# Patient Record
Sex: Female | Born: 1950
Health system: Southern US, Community
[De-identification: ages and names within clinical notes are randomized; demographics above are authoritative.]

## PROBLEM LIST (undated history)

## (undated) DIAGNOSIS — I493 Ventricular premature depolarization: Secondary | ICD-10-CM

## (undated) DIAGNOSIS — N186 End stage renal disease: Secondary | ICD-10-CM

## (undated) DIAGNOSIS — I428 Other cardiomyopathies: Secondary | ICD-10-CM

## (undated) DIAGNOSIS — R011 Cardiac murmur, unspecified: Secondary | ICD-10-CM

## (undated) DIAGNOSIS — I10 Essential (primary) hypertension: Secondary | ICD-10-CM

## (undated) DIAGNOSIS — I340 Nonrheumatic mitral (valve) insufficiency: Secondary | ICD-10-CM

## (undated) DIAGNOSIS — N189 Chronic kidney disease, unspecified: Secondary | ICD-10-CM

## (undated) DIAGNOSIS — I272 Pulmonary hypertension, unspecified: Secondary | ICD-10-CM

## (undated) DIAGNOSIS — I509 Heart failure, unspecified: Secondary | ICD-10-CM

## (undated) DIAGNOSIS — E042 Nontoxic multinodular goiter: Secondary | ICD-10-CM

## (undated) DIAGNOSIS — R0600 Dyspnea, unspecified: Secondary | ICD-10-CM

## (undated) DIAGNOSIS — I499 Cardiac arrhythmia, unspecified: Secondary | ICD-10-CM

## (undated) DIAGNOSIS — T884XXA Failed or difficult intubation, initial encounter: Secondary | ICD-10-CM

## (undated) HISTORY — DX: Essential (primary) hypertension: I10

## (undated) HISTORY — DX: Ventricular premature depolarization: I49.3

## (undated) HISTORY — DX: Other cardiomyopathies: I42.8

## (undated) HISTORY — DX: Pulmonary hypertension, unspecified: I27.20

## (undated) HISTORY — DX: Nonrheumatic mitral (valve) insufficiency: I34.0

## (undated) HISTORY — DX: Cardiac arrhythmia, unspecified: I49.9

---

## 1998-05-27 ENCOUNTER — Encounter: Admission: RE | Admit: 1998-05-27 | Discharge: 1998-05-27 | Payer: Self-pay | Admitting: Family Medicine

## 1998-05-30 ENCOUNTER — Encounter: Admission: RE | Admit: 1998-05-30 | Discharge: 1998-05-30 | Payer: Self-pay | Admitting: Family Medicine

## 1998-10-03 ENCOUNTER — Other Ambulatory Visit: Admission: RE | Admit: 1998-10-03 | Discharge: 1998-10-03 | Payer: Self-pay | Admitting: Obstetrics and Gynecology

## 1999-11-06 ENCOUNTER — Other Ambulatory Visit: Admission: RE | Admit: 1999-11-06 | Discharge: 1999-11-06 | Payer: Self-pay | Admitting: Obstetrics and Gynecology

## 1999-11-20 ENCOUNTER — Encounter: Payer: Self-pay | Admitting: Obstetrics and Gynecology

## 1999-11-20 ENCOUNTER — Encounter: Admission: RE | Admit: 1999-11-20 | Discharge: 1999-11-20 | Payer: Self-pay | Admitting: Obstetrics and Gynecology

## 1999-11-27 ENCOUNTER — Encounter: Admission: RE | Admit: 1999-11-27 | Discharge: 1999-11-27 | Payer: Self-pay | Admitting: Family Medicine

## 2000-11-08 ENCOUNTER — Other Ambulatory Visit: Admission: RE | Admit: 2000-11-08 | Discharge: 2000-11-08 | Payer: Self-pay | Admitting: Obstetrics and Gynecology

## 2000-11-25 ENCOUNTER — Encounter: Admission: RE | Admit: 2000-11-25 | Discharge: 2000-11-25 | Payer: Self-pay | Admitting: Obstetrics and Gynecology

## 2000-11-25 ENCOUNTER — Encounter: Payer: Self-pay | Admitting: Obstetrics and Gynecology

## 2001-11-10 ENCOUNTER — Other Ambulatory Visit: Admission: RE | Admit: 2001-11-10 | Discharge: 2001-11-10 | Payer: Self-pay | Admitting: Obstetrics and Gynecology

## 2001-11-30 HISTORY — PX: CARDIAC CATHETERIZATION: SHX172

## 2001-12-02 ENCOUNTER — Encounter: Payer: Self-pay | Admitting: Obstetrics and Gynecology

## 2001-12-02 ENCOUNTER — Encounter: Admission: RE | Admit: 2001-12-02 | Discharge: 2001-12-02 | Payer: Self-pay | Admitting: Obstetrics and Gynecology

## 2001-12-16 ENCOUNTER — Ambulatory Visit (HOSPITAL_COMMUNITY): Admission: RE | Admit: 2001-12-16 | Discharge: 2001-12-16 | Payer: Self-pay | Admitting: Cardiology

## 2002-11-14 ENCOUNTER — Other Ambulatory Visit: Admission: RE | Admit: 2002-11-14 | Discharge: 2002-11-14 | Payer: Self-pay | Admitting: Obstetrics and Gynecology

## 2002-12-12 ENCOUNTER — Encounter: Admission: RE | Admit: 2002-12-12 | Discharge: 2002-12-12 | Payer: Self-pay | Admitting: Obstetrics and Gynecology

## 2002-12-12 ENCOUNTER — Encounter: Payer: Self-pay | Admitting: Obstetrics and Gynecology

## 2003-11-16 ENCOUNTER — Other Ambulatory Visit: Admission: RE | Admit: 2003-11-16 | Discharge: 2003-11-16 | Payer: Self-pay | Admitting: Obstetrics and Gynecology

## 2003-12-14 ENCOUNTER — Encounter: Admission: RE | Admit: 2003-12-14 | Discharge: 2003-12-14 | Payer: Self-pay | Admitting: Obstetrics and Gynecology

## 2004-11-17 ENCOUNTER — Other Ambulatory Visit: Admission: RE | Admit: 2004-11-17 | Discharge: 2004-11-17 | Payer: Self-pay | Admitting: Obstetrics and Gynecology

## 2004-12-15 ENCOUNTER — Encounter: Admission: RE | Admit: 2004-12-15 | Discharge: 2004-12-15 | Payer: Self-pay | Admitting: Obstetrics and Gynecology

## 2004-12-16 ENCOUNTER — Ambulatory Visit: Payer: Self-pay | Admitting: Cardiology

## 2005-11-19 ENCOUNTER — Ambulatory Visit: Payer: Self-pay | Admitting: Internal Medicine

## 2005-11-27 ENCOUNTER — Other Ambulatory Visit: Admission: RE | Admit: 2005-11-27 | Discharge: 2005-11-27 | Payer: Self-pay | Admitting: Obstetrics and Gynecology

## 2005-12-16 ENCOUNTER — Encounter: Admission: RE | Admit: 2005-12-16 | Discharge: 2005-12-16 | Payer: Self-pay | Admitting: Obstetrics and Gynecology

## 2005-12-18 ENCOUNTER — Ambulatory Visit: Payer: Self-pay | Admitting: Cardiology

## 2006-11-29 ENCOUNTER — Other Ambulatory Visit: Admission: RE | Admit: 2006-11-29 | Discharge: 2006-11-29 | Payer: Self-pay | Admitting: Obstetrics and Gynecology

## 2006-12-17 ENCOUNTER — Encounter: Admission: RE | Admit: 2006-12-17 | Discharge: 2006-12-17 | Payer: Self-pay | Admitting: Obstetrics and Gynecology

## 2006-12-29 ENCOUNTER — Encounter: Admission: RE | Admit: 2006-12-29 | Discharge: 2006-12-29 | Payer: Self-pay | Admitting: Obstetrics and Gynecology

## 2007-01-18 ENCOUNTER — Ambulatory Visit: Payer: Self-pay | Admitting: Internal Medicine

## 2007-01-18 LAB — CONVERTED CEMR LAB
ALT: 17 units/L (ref 0–40)
AST: 15 units/L (ref 0–37)
Alkaline Phosphatase: 55 units/L (ref 39–117)
BUN: 20 mg/dL (ref 6–23)
CRP, High Sensitivity: 11 — ABNORMAL HIGH (ref 0.00–5.00)
Calcium: 10 mg/dL (ref 8.4–10.5)
GFR calc Af Amer: 74 mL/min
GFR calc non Af Amer: 61 mL/min
Glucose, Bld: 110 mg/dL — ABNORMAL HIGH (ref 70–99)
Ketones, ur: NEGATIVE mg/dL
LDL Cholesterol: 115 mg/dL — ABNORMAL HIGH (ref 0–99)
Nitrite: NEGATIVE
Pro B Natriuretic peptide (BNP): 354 pg/mL — ABNORMAL HIGH (ref 0.0–100.0)
Specific Gravity, Urine: 1.02 (ref 1.000–1.03)
Total CHOL/HDL Ratio: 3.6
Total Protein, Urine: NEGATIVE mg/dL
Total Protein: 7.9 g/dL (ref 6.0–8.3)
Triglycerides: 128 mg/dL (ref 0–149)
Urine Glucose: NEGATIVE mg/dL
VLDL: 26 mg/dL (ref 0–40)

## 2007-01-25 ENCOUNTER — Ambulatory Visit: Payer: Self-pay | Admitting: Pulmonary Disease

## 2007-02-28 ENCOUNTER — Ambulatory Visit: Payer: Self-pay | Admitting: Internal Medicine

## 2007-02-28 LAB — CONVERTED CEMR LAB: TSH: 0.47 microintl units/mL (ref 0.35–5.50)

## 2007-12-05 ENCOUNTER — Other Ambulatory Visit: Admission: RE | Admit: 2007-12-05 | Discharge: 2007-12-05 | Payer: Self-pay | Admitting: Obstetrics and Gynecology

## 2007-12-21 ENCOUNTER — Encounter: Admission: RE | Admit: 2007-12-21 | Discharge: 2007-12-21 | Payer: Self-pay | Admitting: Obstetrics and Gynecology

## 2008-01-20 DIAGNOSIS — I5022 Chronic systolic (congestive) heart failure: Secondary | ICD-10-CM | POA: Insufficient documentation

## 2008-01-20 DIAGNOSIS — I10 Essential (primary) hypertension: Secondary | ICD-10-CM | POA: Insufficient documentation

## 2008-01-20 DIAGNOSIS — E669 Obesity, unspecified: Secondary | ICD-10-CM | POA: Insufficient documentation

## 2008-01-20 DIAGNOSIS — I5023 Acute on chronic systolic (congestive) heart failure: Secondary | ICD-10-CM | POA: Insufficient documentation

## 2008-01-20 DIAGNOSIS — I5033 Acute on chronic diastolic (congestive) heart failure: Secondary | ICD-10-CM | POA: Insufficient documentation

## 2008-01-23 ENCOUNTER — Ambulatory Visit: Payer: Self-pay | Admitting: Internal Medicine

## 2008-01-25 LAB — CONVERTED CEMR LAB
Alkaline Phosphatase: 56 units/L (ref 39–117)
BUN: 15 mg/dL (ref 6–23)
Basophils Relative: 1 % (ref 0.0–1.0)
CO2: 29 meq/L (ref 19–32)
Calcium: 10.2 mg/dL (ref 8.4–10.5)
Chloride: 102 meq/L (ref 96–112)
Creatinine, Ser: 0.9 mg/dL (ref 0.4–1.2)
MCHC: 33.4 g/dL (ref 30.0–36.0)
Monocytes Relative: 8.6 % (ref 3.0–11.0)
Pro B Natriuretic peptide (BNP): 238 pg/mL — ABNORMAL HIGH (ref 0.0–100.0)
RBC: 4.33 M/uL (ref 3.87–5.11)
RDW: 12.6 % (ref 11.5–14.6)
Sodium: 139 meq/L (ref 135–145)
TSH: 0.54 microintl units/mL (ref 0.35–5.50)
Total Bilirubin: 2.3 mg/dL — ABNORMAL HIGH (ref 0.3–1.2)
Total CHOL/HDL Ratio: 3.3
Total Protein: 7.8 g/dL (ref 6.0–8.3)
Triglycerides: 83 mg/dL (ref 0–149)
WBC: 5.6 10*3/uL (ref 4.5–10.5)

## 2008-09-18 ENCOUNTER — Ambulatory Visit: Payer: Self-pay | Admitting: Family Medicine

## 2008-09-20 ENCOUNTER — Ambulatory Visit: Payer: Self-pay | Admitting: *Deleted

## 2008-11-13 ENCOUNTER — Ambulatory Visit: Payer: Self-pay | Admitting: Internal Medicine

## 2008-11-13 ENCOUNTER — Encounter (INDEPENDENT_AMBULATORY_CARE_PROVIDER_SITE_OTHER): Payer: Self-pay | Admitting: Family Medicine

## 2008-11-13 LAB — CONVERTED CEMR LAB
ALT: 9 units/L (ref 0–35)
AST: 9 units/L (ref 0–37)
Albumin: 4.2 g/dL (ref 3.5–5.2)
Alkaline Phosphatase: 63 units/L (ref 39–117)
BUN: 20 mg/dL (ref 6–23)
Basophils Relative: 1 % (ref 0–1)
Calcium: 9.3 mg/dL (ref 8.4–10.5)
Cholesterol: 193 mg/dL (ref 0–200)
Eosinophils Relative: 1 % (ref 0–5)
HCT: 39 % (ref 36.0–46.0)
HDL: 55 mg/dL (ref >39)
Lymphs Abs: 1.9 10*3/uL (ref 0.7–4.0)
Monocytes Absolute: 0.5 10*3/uL (ref 0.1–1.0)
Neutro Abs: 3.1 10*3/uL (ref 1.7–7.7)
Potassium: 4.5 meq/L (ref 3.5–5.3)
Sodium: 139 meq/L (ref 135–145)
Total Bilirubin: 2 mg/dL — ABNORMAL HIGH (ref 0.3–1.2)
WBC: 5.6 10*3/uL (ref 4.0–10.5)

## 2008-11-19 ENCOUNTER — Ambulatory Visit: Payer: Self-pay | Admitting: Family Medicine

## 2008-11-19 LAB — CONVERTED CEMR LAB: Vit D, 1,25-Dihydroxy: 24 — ABNORMAL LOW (ref 30–89)

## 2008-12-04 ENCOUNTER — Ambulatory Visit: Payer: Self-pay | Admitting: Internal Medicine

## 2008-12-21 ENCOUNTER — Ambulatory Visit (HOSPITAL_COMMUNITY): Admission: RE | Admit: 2008-12-21 | Discharge: 2008-12-21 | Payer: Self-pay | Admitting: Internal Medicine

## 2009-03-20 ENCOUNTER — Ambulatory Visit: Payer: Self-pay | Admitting: Family Medicine

## 2009-04-02 ENCOUNTER — Ambulatory Visit: Payer: Self-pay | Admitting: Family Medicine

## 2009-07-03 ENCOUNTER — Ambulatory Visit: Payer: Self-pay | Admitting: Family Medicine

## 2009-08-08 ENCOUNTER — Ambulatory Visit: Payer: Self-pay | Admitting: Family Medicine

## 2009-08-15 ENCOUNTER — Ambulatory Visit: Payer: Self-pay | Admitting: Family Medicine

## 2009-12-09 ENCOUNTER — Other Ambulatory Visit: Admission: RE | Admit: 2009-12-09 | Discharge: 2009-12-09 | Payer: Self-pay | Admitting: Family Medicine

## 2010-01-01 ENCOUNTER — Encounter: Admission: RE | Admit: 2010-01-01 | Discharge: 2010-01-01 | Payer: Self-pay | Admitting: Family Medicine

## 2010-01-23 ENCOUNTER — Encounter: Payer: Self-pay | Admitting: Internal Medicine

## 2010-01-31 ENCOUNTER — Encounter (INDEPENDENT_AMBULATORY_CARE_PROVIDER_SITE_OTHER): Payer: Self-pay | Admitting: *Deleted

## 2010-12-24 ENCOUNTER — Other Ambulatory Visit: Payer: Self-pay | Admitting: Family Medicine

## 2010-12-24 DIAGNOSIS — Z1239 Encounter for other screening for malignant neoplasm of breast: Secondary | ICD-10-CM

## 2010-12-24 DIAGNOSIS — Z1231 Encounter for screening mammogram for malignant neoplasm of breast: Secondary | ICD-10-CM

## 2011-01-01 NOTE — Letter (Signed)
Summary: Referral - not able to see patient  [facility]  [place], [place] [address]   Phone: [phone]  Fax: [phone]       January 31, 2010   [place] [facility] [place]. [address], [place], [place] [address]     Re:   Vanessa [name] DOB:  December 25, 1950 MRN:   [id number]    Dear Dr. Laurann Montana:  Thank you for your kind referral of the above patient.  We have attempted to schedule the recommended procedure Screening Colonoscopy but have not been able to schedule because:  ___ The patient was not available by phone and/or has not returned our calls.   X  The patient declined to schedule the procedure at this time.  We appreciate the referral and hope that we will have the opportunity to treat this patient in the future.    Sincerely,    [facility] [phone]

## 2011-01-01 NOTE — Letter (Signed)
Summary: Letter Concerning [facility]  Letter Concerning [facility]   Imported By: Rise Patience 02/06/2010 12:10:59  _____________________________________________________________________  External Attachment:    Type:   Image     Comment:   External Document

## 2011-01-08 ENCOUNTER — Ambulatory Visit
Admission: RE | Admit: 2011-01-08 | Discharge: 2011-01-08 | Disposition: A | Discharge status: Home / Self Care | Payer: BC Managed Care – PPO | Source: Ambulatory Visit | Attending: Family Medicine | Admitting: Family Medicine

## 2011-01-08 DIAGNOSIS — Z1231 Encounter for screening mammogram for malignant neoplasm of breast: Secondary | ICD-10-CM

## 2011-04-17 NOTE — Assessment & Plan Note (Signed)
[name]                             PULMONARY OFFICE NOTE   Vanessa, [name]                     MRN:          [id number]  DATE:01/18/2007                            DOB:          09/21/1951    HISTORY OF PRESENT ILLNESS:  A 60 year old black female with morbid  obesity complicated by hypertension in turn complicated by nonischemic  cardiopathy with evidence of significant mitral regurgitation and  reduction of ejection fraction in 2002 with only mild mitral  regurgitation by repeat in 2003 with followup yearly now by cardiology.  She admits that she has not been consistent about diet or exercise and  struggling to control her weight. She has not yet seen a nutritionist.  However, she denies any increase in dyspnea over baseline, orthopnea,  PND, exertional chest pain, fevers, chills, sweats or significant cough.   PAST MEDICAL HISTORY:  1. Morbid obesity with target weight less than 146.  2. Hypertension.  3. CHF. Most recent echocardiogram November 2003.  4. Diverticulosis by colonoscopy 2004; in the computer for recall in      2009.  5. Asymptomatic height loss followed by Dr. Cherylann [name] already with      bone density with what she reports is normal.   ALLERGIES:  None known.   MEDICATIONS:  Were recorded in detail on the worksheet dated January 08, 2007, but I am not convinced that this is correct as listed.   SOCIAL HISTORY:  She has never smoked. She states she works on her feet  all day, and thinks she may be losing her job in May. She denies any  alcohol use.   FAMILY HISTORY:  Positive for heart disease in her father who is 30.  Mother has hyperlipidemia. One sister with asthma. All other siblings  are healthy except for one who has developed breast cancer in the last  year.   REVIEW OF SYSTEMS:  Taken in detail on the work sheet and corrected as  listed.   PHYSICAL EXAMINATION:  GENERAL:  This is an obese black female  with  somewhat of a childlike affect and attitude.  VITAL SIGNS:  Stable.  HEENT:  Unremarkable. Oropharynx is clear. Dentition is intact. Ear  canals clear bilaterally. Nasal turbinates were normal.  NECK:  Supple without cervical adenopathy or tenderness. Carotid  upstrokes were brisk without any bruits.  LUNGS:  Lung fields were completely clear bilaterally to auscultation  and percussion.  HEART:  Regular rhythm without murmur, gallop or rub.  ABDOMEN:  Soft, benign.  EXTREMITIES:  Warm without any calf tenderness, cyanosis, clubbing.  Pedal pulses were stronger at the dorsalis pedis and posterior tibial  distribution bilaterally.   EKG revealed sinus rhythm with frequent PVCs.   LABORATORY DATA:  A stable total bilirubin 2. Fasting blood sugar 110.  Total cholesterol 194 with LDL 115 and HDL 53. BNP 354. CRP 11. I  intended to do a TSH, but it was omitted.   Chest x-ray revealed mild cardiomegaly, no infiltrates or effusion.   IMPRESSION:  1. Morbid obesity continues to be this patient's primary  challenge and      will need to be addressed long-time by the patient in a more      consistent fashion. I have offered the services of [facility]      for this purpose and arranged for her to see them with a food diary      in hand. TSH needs to be done at next blood draw.  2. Hypertension is adequately controlled on present regimen. I am      concerned that she is having a bit of trouble keeping up with      instructions, and I am going to have her return for full medication      explanation within two weeks and review her lab work with her.  3. Mild chronic elevation of bilirubin of unclear etiology. No      evidence of biliary obstruction or adverse drug effect at present.      Chart review indicates her bilirubin is 1.9 dating all the way back      to 2005 so she simply may be outlyer on the bell-shaped curve of      normalcy.  4. General health maintenance. Dr. Cherylann [name]  is following her for      gynecology. She is due for colonoscopy in 2009 here. She was due      tetanus today which was given. Followup here will be every three      months with medication reconciliation in the meantime.     Vanessa [name]. Vanessa Novas, MD, Vanessa [name]  Electronically Signed    MBW/MedQ  DD: 01/18/2007  DT: 01/19/2007  Job #: [id number]

## 2011-04-17 NOTE — Assessment & Plan Note (Signed)
[place]                             PULMONARY OFFICE NOTE   Vanessa [name], Vanessa [name]                     MRN:          [id number]  DATE:01/25/2007                            DOB:          1950/12/21    HISTORY OF PRESENT ILLNESS:  The patient is a 60 year old African-  American female patient of Dr. Gustavus Bryant who has a known history of morbid  obesity complicated by hypertension and nonischemic cardiomyopathy who  presents for a 1-week followup to review medications.  The patient has  brought all of her medications in today.  The patient has had many  changes to her medication regimen recently and do not seem to be correct  with our medication list.  The patient is supposed to be taking Lasix 40  mg daily; however, reports she is not taking that, very rarely takes at  all.  The patient also was thought to be on bisoprolol HCT but is in  fact on just bisoprolol 5 mg and a separate hydrochlorothiazide  prescription.  Also, the patient previously had been thought to be  taking Diovan and digoxin but however is not taking that.  The patient  denies any chest pain, shortness of breath, abdominal pain, nausea,  vomiting.  Last visit, the patient had been recommended to go to a  nutritionist to help with her weight.  The patient also had lab work  done which showed a total cholesterol of 194, an LDL of 115, and an HDL  of 53, and a BNP elevated at 354 and CRP at 11.  A TSH was not done last  visit and will need to be done today.   PAST MEDICAL HISTORY:  Reviewed.   CURRENT MEDICATIONS:  Reviewed.   PHYSICAL EXAMINATION:  GENERAL:  The patient is a morbidly-obese female  in no acute distress.  VITAL SIGNS:  She is afebrile with stable vital signs.  O2 saturation is  94% on room air.  HEENT:  Unremarkable.  NECK:  Supple without adenopathy.  No JVD.  LUNG SOUNDS:  Clear.  CARDIAC:  Regular rate and rhythm.  ABDOMEN:  Soft and obese.  EXTREMITIES:  Warm  without any calf tenderness, cyanosis, clubbing, or  edema.   IMPRESSION AND PLAN:  1. Complex medication regimen.  The patient's medications reviewed in      detail.  Patient education was provided.  A computerized medication      calendar was completed for this patient and reviewed in detail.      The patient is aware to bring this back to each and every visit.      Medications refills were given through [facility] mail order today.  2. Morbid obesity.  TSH is pending at time of dictation.  3. Hypertension, well controlled.   The patient will follow back up with Dr. [name] in 3 months or sooner if  needed.      Rexene Edison, NP  Electronically Signed      [name] [name], MD, [facility]  Electronically Signed   TP/MedQ  DD: 01/26/2007  DT: 01/26/2007  Job #: [id number]

## 2011-04-17 NOTE — Assessment & Plan Note (Signed)
[place]                             PULMONARY OFFICE NOTE   Vanessa [name], Vanessa [name]                     MRN:          [id number]  DATE:02/28/2007                            DOB:          October 19, 1951    HISTORY:  This is a 60 year old black female with morbid obesity and  hypertension as well as a history of idiopathic nonischemic  cardiomyopathy.  She returns now after a comprehensive healthcare  evaluation which was done to follow up her chronic problems revealed  that her TSH was excessively suppressed.  Two years ago her TSH was  borderline suppressed.  Despite this finding, the patient denies any  excess heat or cold intolerance, palpitations, nervousness, frequency of  bowel movements, sweating, or tremor.  She also denies any exertional  chest pain, orthopnea, PND, or leg swelling.   PAST MEDICAL HISTORY:  Significant for:  1. Nonischemic cardiomyopathy as noted.  2. Diverticulosis.   MEDICATIONS:  Reviewed in detail and serendipitously she is already on  bisoprolol at 5 mg one daily.   ALLERGIES:  None known.   FAMILY HISTORY:  Negative for thyroid disease.   SOCIAL HISTORY:  She has never been a smoker.   PHYSICAL EXAMINATION:  VITAL SIGNS:  Note that her pulse rate is 82 on  this dose of bisoprolol and on her last visit her pulse rate was 102.  HEENT:  Significant for a subtle lid lag.  Oropharynx is otherwise clear  with tongue midline.  NECK:  Reveals minimal thyromegaly diffusely with no definite bruit.  There is no adenopathy or obvious neck mass.  HEART:  Regular rate and rhythm without murmur, gallop or rub present.  ABDOMEN:  Soft, benign.  EXTREMITIES:  Warm without calf tenderness, cyanosis, clubbing, edema.  There was minimal sweating of the palms.  There was also minimal  hyperreflexia.   IMPRESSION:  1. Incipient hyperthyroidism, possible Graves disease associated with      diffuse thyroid enlargement and subtle lid  lag.  I am going to      confirmed the diagnosis with a total T4, T3 uptake and free T3      level today and repeat her TSH.  If these all point to      hyperthyroidism, I suspect that I will be referring her on to Dr.      Loanne Drilling.  2. Hypertension, well controlled.  As noted bisoprolol is an excellent      choice for her apparent hyperthyroid state and should not be      changed.  3. Congestive heart failure relatively well compensated on her present      regimen.  No changes in therapy.  4. The record indicates she has had height loss.  One reason to treat      her thyroid state is so she does not have significant calcium      balance disturbance.  I will make Dr. Cherylann Banas aware since he has      been following her bone metabolism, that she may be hyperthyroid      and we will attempt  to correct that as soon as possible.  5. I had a long discussion with this patient regarding her other major      issue, namely weight loss, which she has yet to address in a      consistent fashion.  One concern is that treating hyperthyroidism      may actually translate into more weight gain but time will tell in      this regard.  I have offered to refer her to nutrition which at      this point she declines and note that she does plan to begin      exercising regularly but I have told her to take it easy for now      until we know whether or not she is truly hyperthyroid.     [name] [name], MD, [facility]  Electronically Signed    MBW/MedQ  DD: 02/28/2007  DT: 02/28/2007  Job #: [id number]   cc:   Quillian Quince L. Cherylann Banas, M.D.

## 2011-04-17 NOTE — Cardiovascular Report (Signed)
[place]  Patient:    ZAHIRA, FOLKS Visit Number: [id number] MRN: [id number]          Service Type: CAT Location: [place] 01 Attending Physician:  Ernestine Mcmurray Dictated by:   Terald Sleeper, M.D. [facility] Proc. Date: 12/16/01 Admit Date:  12/16/2001   CC:         Legrand Como B. [name], M.D. [facility]   Cardiac Catheterization  DATE OF BIRTH:  19-Apr-1951.  REFERRING PHYSICIAN:  [name] [name], M.D.  OPERATOR:  Terald Sleeper, M.D.  PROCEDURES PERFORMED: 1. Left and right heart catheterization. 2. Selective coronary angiography. 3. Ventriculography.  DIAGNOSES: 1. Nonischemic cardiomyopathy. 2. Severe left ventricular systolic dysfunction. 3. No evidence of flow-limiting epicardial coronary artery disease. 4. Elevated left ventricular end diastolic pressure.  HISTORY:  The patient is a 60 year old African-American female with a longstanding history of hypertension.  Over the last several months, the patient had developed increased shortness of breath and was referred for cardiology evaluation.  The patient was seen by Dr. Dannielle Burn and found to have signs and symptoms of congestive heart failure.  An echocardiographic study was ordered and the patient was found to have moderately severe LV systolic dysfunction and 2+ mitral regurgitation with possible segmental wall motion abnormalities.  The patient subsequently was referred for a diagnostic catheterization to assess her coronary anatomy as well as left- and right-sided hemodynamics.  DESCRIPTION OF PROCEDURE:  After informed consent was obtained, the patient was brought to the catheterization laboratory.  The right groin was sterilely prepped and draped.  Lidocaine, 1%, was infiltrated.  A #6 French arterial sheath was placed according to modified Seldinger technique.  Subsequently, a #7 [place] venous sheath was placed also according to the modified Seldinger technique.  A right heart  catheter was floated and appropriate right-sided hemodynamics were obtained as well as thermodilution and Fick cardiac output calculations.  Subsequently coronary angiography was performed using #6 [place] preformed JL4 and JR4 catheters.  Selective coronary angiography was performed in various projections using manual injection of contrast.  Subsequently, left ventriculography was performed using a #6 French angled pigtail catheter. Appropriate left-sided hemodynamics were obtained.  Ventriculography was performed in single plane RAO projection using power injection of contrast. At the termination of the procedure, all catheters and sheath were removed and the patient was brought back to the holding area.  FINDINGS:  Hemodynamics:  Mean right atrial pressure 12 mmHg.  Right ventricular pressure 51/15 mmHg.  PA pressure 51/27 mmHg.  Pulmonary capillary wedge pressure 20 mmHg.  Left ventricular pressure 130/20 mmHg.  Aortic pressure was 128/85 mmHg.  Cardiac output by thermodilution 4.9 liters per minute.  Cardiac index by thermodilution 2.5 liters per minute.  Cardiac index by thermodilution 2.5 liters per minute.  Cardiac output calculated according to the Fick equation yielded a cardiac output of 3.1 liters per minute and a cardiac index of 1.58 liters per minute.  This corresponded to a systemic vascular resistance of 2322 dynes x 1 cm divided by centimeters to the 5th.  Ventriculography:  Ejection fraction estimated at 25-30%.  Ejection fraction was calculated at 27%.  There was 1-2+ mitral regurgitation.  Overall there appeared to be global hypokinesis.  Selective coronary angiography: 1. The left main coronary artery was a large caliber vessel with no evidence    of flow-limiting coronary artery disease. 2. The left anterior descending artery was a large caliber vessel with no    evidence of epicardial flow-limiting  coronary artery disease. 3. The circumflex coronary artery is a  codominant vessel with no evidence of    flow-limiting coronary artery disease. 4. The right coronary artery, again, a large caliber vessel terminating in a    small posterior descending artery with no evidence of flow-limiting    coronary artery disease.  RECOMMENDATIONS:  The results were relayed to Dr. [name].  It appears the pativentricular has a nonischemic cardiomyopathy still with slightly elevated left ent end diastolic pressures but with low output.  The patients medical regimen will be adjusted.  Digoxin will be added at 0.25 mg p.o. q day. Diovan can be increased to 320 mg p.o. q day and the careful addition of a beta blocker with bisoprolol 2.5 mg p.o. q day.  The patient will have a follow-up appointment with Dr. Dannielle Burn.  In the future can be followed in the heart failure clinic.  The patient should also be considered for the Warcef trial and a referral to our research foundation will be made. Dictated by:   Terald Sleeper, M.D. [place] Attending Physician:  Ernestine Mcmurray DD:  12/16/01 TD:  12/16/01 Job: [id number]

## 2011-06-09 ENCOUNTER — Other Ambulatory Visit (HOSPITAL_COMMUNITY)
Admission: RE | Admit: 2011-06-09 | Discharge: 2011-06-09 | Disposition: A | Discharge status: Home / Self Care | Payer: BC Managed Care – PPO | Source: Ambulatory Visit | Attending: Family Medicine | Admitting: Family Medicine

## 2011-06-09 ENCOUNTER — Other Ambulatory Visit: Payer: Self-pay | Admitting: Physician Assistant

## 2011-06-09 DIAGNOSIS — Z124 Encounter for screening for malignant neoplasm of cervix: Secondary | ICD-10-CM | POA: Insufficient documentation

## 2011-12-03 ENCOUNTER — Other Ambulatory Visit: Payer: Self-pay | Admitting: Family Medicine

## 2011-12-03 DIAGNOSIS — Z1231 Encounter for screening mammogram for malignant neoplasm of breast: Secondary | ICD-10-CM

## 2012-01-11 ENCOUNTER — Ambulatory Visit
Admission: RE | Admit: 2012-01-11 | Discharge: 2012-01-11 | Disposition: A | Discharge status: Home / Self Care | Payer: BC Managed Care – PPO | Source: Ambulatory Visit | Attending: Family Medicine | Admitting: Family Medicine

## 2012-01-11 DIAGNOSIS — Z1231 Encounter for screening mammogram for malignant neoplasm of breast: Secondary | ICD-10-CM

## 2012-06-08 ENCOUNTER — Other Ambulatory Visit: Payer: Self-pay | Admitting: Physician Assistant

## 2012-06-08 ENCOUNTER — Other Ambulatory Visit (HOSPITAL_COMMUNITY)
Admission: RE | Admit: 2012-06-08 | Discharge: 2012-06-08 | Disposition: A | Discharge status: Home / Self Care | Payer: BC Managed Care – PPO | Source: Ambulatory Visit | Attending: Family Medicine | Admitting: Family Medicine

## 2012-06-08 DIAGNOSIS — Z124 Encounter for screening for malignant neoplasm of cervix: Secondary | ICD-10-CM | POA: Insufficient documentation

## 2012-09-30 HISTORY — PX: CARDIAC CATHETERIZATION: SHX172

## 2012-10-14 ENCOUNTER — Other Ambulatory Visit: Payer: Self-pay | Admitting: Cardiology

## 2012-10-18 ENCOUNTER — Encounter: Payer: Self-pay | Admitting: Cardiology

## 2012-10-18 ENCOUNTER — Other Ambulatory Visit: Payer: Self-pay | Admitting: Cardiology

## 2012-10-18 NOTE — H&P (Signed)
  Office Visit     [name], Vanessa [name] Provider: Fransico Him, MD  DOB: 1951/01/22 Age: 61 Y Sex: Female Date: 10/03/2012  Phone: [phone]   Address: [address] [place], [place]  Pcp: NOELLE REDMON       Subjective:     CC:    1. TT/F/u on stress test results.        HPI:  General:  The patient presents today for followup of her recent nuclear stress test. She recently had a PE done and a murmur was noted. An echo showed mild LV dysfunction with moderate MR and EF 45%. Shecomplained of occasional pain in her left shoulder blade. The scapular pain comes on with exertion and resolves with rest. A nuclear stress test was done which showed moderate LV dysfunction and ischemia in the apcial and inferoapical regions. .        Medical History: Hypertension, mod MR and mild LV dysfunction EF 45% with moderate MR by echo 2012, history of nonischemic DCM by cath 2003 showing EF 27% with 1-2+ MR, normal coronary arteries, PVC's.        Family History: Father: deceased CHF Sister 1: Breast CA (dx in early 77s)        Social History:  General:  History of smoking  cigarettes: Never smoked no Alcohol.  no Exercise.  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  Marital Status: single.  Children: 1.        Medications: Calcium 600 MG Tablet as directed once a day, Ecotrin 325 MG Tablet Delayed Release 1 tablet once a day, Vitamin D 1000 UNIT Tablet 1 tablet Once a day, Hydrochlorothiazide 25 MG Tablet 1 tablet Once a day, Bisoprolol Fumarate 5 MG Tablet 1 tablet Once a day, Amlodipine Besylate 10 MG Tablet 1 tablet Once a day, Medication List reviewed and reconciled with the patient       Allergies: N.K.D.A.      Objective:     Vitals: Wt 209.8, Wt change -.2 lb, Ht 62, BMI 38.37, Pulse sitting 80, BP sitting 142/80.       Examination:        Assessment:     Assessment:  1. Abnormal cardiovascular function study - 794.30 (Primary)  2. Benign  hypertension - 401.1  3. Cardiomyopathy - 425.4  4. Mitral Regurgitation - 424.0  5. PVCs (premature ventricular contractions) - 427.69    Plan:     1. Abnormal cardiovascular function study  I have discussed the results of the nuclear stress test that was done for exercise induced scapular pain. Her nuclear stress test shows an apical and inferoapical defect with stress and worsening LVF at 33%. I have recommended proceeding with cardiac cath. , Risks and benefits of cardiac catheterization have been reviewed including risk of stroke, heart attack, death, bleeding, renal impariment and arterial damage. There was ample oppurtuny to answer questions. Alternatives were discussed. Patient understands and wishes to proceed.        Immunizations:        Labs:        Preventive:         Follow Up: cath      Provider: Fransico Him, MD  [name], Vanessa [name] DOB: Feb 08, 1951 Date: 10/03/2012

## 2012-10-21 ENCOUNTER — Encounter (HOSPITAL_BASED_OUTPATIENT_CLINIC_OR_DEPARTMENT_OTHER)
Admission: RE | Disposition: A | Discharge status: Home / Self Care | Payer: Self-pay | Source: Ambulatory Visit | Attending: Cardiology

## 2012-10-21 ENCOUNTER — Inpatient Hospital Stay (HOSPITAL_BASED_OUTPATIENT_CLINIC_OR_DEPARTMENT_OTHER)
Admission: RE | Admit: 2012-10-21 | Discharge: 2012-10-21 | Disposition: A | Discharge status: Home / Self Care | Payer: BC Managed Care – PPO | Source: Ambulatory Visit | Attending: Cardiology | Admitting: Cardiology

## 2012-10-21 DIAGNOSIS — I428 Other cardiomyopathies: Secondary | ICD-10-CM | POA: Insufficient documentation

## 2012-10-21 DIAGNOSIS — I059 Rheumatic mitral valve disease, unspecified: Secondary | ICD-10-CM | POA: Insufficient documentation

## 2012-10-21 DIAGNOSIS — I420 Dilated cardiomyopathy: Secondary | ICD-10-CM | POA: Diagnosis present

## 2012-10-21 DIAGNOSIS — I10 Essential (primary) hypertension: Secondary | ICD-10-CM

## 2012-10-21 DIAGNOSIS — R079 Chest pain, unspecified: Secondary | ICD-10-CM | POA: Insufficient documentation

## 2012-10-21 DIAGNOSIS — I509 Heart failure, unspecified: Secondary | ICD-10-CM

## 2012-10-21 SURGERY — JV LEFT HEART CATHETERIZATION WITH CORONARY ANGIOGRAM
Anesthesia: Moderate Sedation

## 2012-10-21 MED ORDER — SODIUM CHLORIDE 0.9 % IV SOLN: 250.0000 mL | INTRAVENOUS | Status: DC | PRN

## 2012-10-21 MED ORDER — SODIUM CHLORIDE 0.9 % IV SOLN: 1.0000 mL/kg/h | INTRAVENOUS | Status: DC

## 2012-10-21 MED ORDER — ONDANSETRON HCL 4 MG/2ML IJ SOLN: 4.0000 mg | Freq: Four times a day (QID) | INTRAMUSCULAR | Status: DC | PRN

## 2012-10-21 MED ORDER — ACETAMINOPHEN 325 MG PO TABS: 650.0000 mg | ORAL_TABLET | ORAL | Status: DC | PRN

## 2012-10-21 MED ORDER — FUROSEMIDE 20 MG PO TABS: 20.0000 mg | ORAL_TABLET | Freq: Every day | ORAL | Status: DC

## 2012-10-21 MED ORDER — RAMIPRIL 5 MG PO CAPS: 5.0000 mg | ORAL_CAPSULE | Freq: Every day | ORAL | Status: DC

## 2012-10-21 MED ORDER — ASPIRIN 81 MG PO CHEW
324.0000 mg | CHEWABLE_TABLET | ORAL | Status: AC
  Administered 2012-10-21: 324 mg via ORAL

## 2012-10-21 MED ORDER — ASPIRIN 81 MG PO TBEC: 81.0000 mg | DELAYED_RELEASE_TABLET | Freq: Every day | ORAL | Status: DC

## 2012-10-21 MED ORDER — AMLODIPINE BESYLATE 5 MG PO TABS: 5.0000 mg | ORAL_TABLET | Freq: Every day | ORAL | Status: DC

## 2012-10-21 MED ORDER — CARVEDILOL 6.25 MG PO TABS: 6.2500 mg | ORAL_TABLET | Freq: Two times a day (BID) | ORAL | Status: DC

## 2012-10-21 MED ORDER — SODIUM CHLORIDE 0.9 % IV SOLN: INTRAVENOUS | Status: DC

## 2012-10-21 MED ORDER — SODIUM CHLORIDE 0.9 % IJ SOLN: 3.0000 mL | Freq: Two times a day (BID) | INTRAMUSCULAR | Status: DC

## 2012-10-21 MED ORDER — SODIUM CHLORIDE 0.9 % IJ SOLN: 3.0000 mL | INTRAMUSCULAR | Status: DC | PRN

## 2012-10-21 MED ORDER — DIAZEPAM 5 MG PO TABS
5.0000 mg | ORAL_TABLET | ORAL | Status: AC
  Administered 2012-10-21: 5 mg via ORAL

## 2012-10-21 NOTE — H&P (View-Only) (Signed)
  Office Visit     [name], Vanessa [name] Provider: Fransico Him, MD  DOB: July 09, 1951 Age: 61 Y Sex: Female Date: 10/03/2012  Phone: [phone]   Address: [address] [place], [place]  Pcp: NOELLE REDMON       Subjective:     CC:    1. TT/F/u on stress test results.        HPI:  General:  The patient presents today for followup of her recent nuclear stress test. She recently had a PE done and a murmur was noted. An echo showed mild LV dysfunction with moderate MR and EF 45%. Shecomplained of occasional pain in her left shoulder blade. The scapular pain comes on with exertion and resolves with rest. A nuclear stress test was done which showed moderate LV dysfunction and ischemia in the apcial and inferoapical regions. .        Medical History: Hypertension, mod MR and mild LV dysfunction EF 45% with moderate MR by echo 2012, history of nonischemic DCM by cath 2003 showing EF 27% with 1-2+ MR, normal coronary arteries, PVC's.        Family History: Father: deceased CHF Sister 1: Breast CA (dx in early 73s)        Social History:  General:  History of smoking  cigarettes: Never smoked no Alcohol.  no Exercise.  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  Marital Status: single.  Children: 1.        Medications: Calcium 600 MG Tablet as directed once a day, Ecotrin 325 MG Tablet Delayed Release 1 tablet once a day, Vitamin D 1000 UNIT Tablet 1 tablet Once a day, Hydrochlorothiazide 25 MG Tablet 1 tablet Once a day, Bisoprolol Fumarate 5 MG Tablet 1 tablet Once a day, Amlodipine Besylate 10 MG Tablet 1 tablet Once a day, Medication List reviewed and reconciled with the patient       Allergies: N.K.D.A.      Objective:     Vitals: Wt 209.8, Wt change -.2 lb, Ht 62, BMI 38.37, Pulse sitting 80, BP sitting 142/80.       Examination:        Assessment:     Assessment:  1. Abnormal cardiovascular function study - 794.30 (Primary)  2. Benign  hypertension - 401.1  3. Cardiomyopathy - 425.4  4. Mitral Regurgitation - 424.0  5. PVCs (premature ventricular contractions) - 427.69    Plan:     1. Abnormal cardiovascular function study  I have discussed the results of the nuclear stress test that was done for exercise induced scapular pain. Her nuclear stress test shows an apical and inferoapical defect with stress and worsening LVF at 33%. I have recommended proceeding with cardiac cath. , Risks and benefits of cardiac catheterization have been reviewed including risk of stroke, heart attack, death, bleeding, renal impariment and arterial damage. There was ample oppurtuny to answer questions. Alternatives were discussed. Patient understands and wishes to proceed.        Immunizations:        Labs:        Preventive:         Follow Up: cath      Provider: Fransico Him, MD  [name], Vanessa [name] DOB: February 20, 1951 Date: 10/03/2012

## 2012-10-21 NOTE — Interval H&P Note (Signed)
History and Physical Interval Note:  10/21/2012 8:49 AM  Vanessa [name]  has presented today for surgery, with the diagnosis of abn stress test  The various methods of treatment have been discussed with the patient and family. After consideration of risks, benefits and other options for treatment, the patient has consented to  Procedure(s) (LRB) with comments: JV LEFT HEART CATHETERIZATION WITH CORONARY ANGIOGRAM (N/A) as a surgical intervention .  The patient's history has been reviewed, patient examined, no change in status, stable for surgery.  I have reviewed the patient's chart and labs.  Questions were answered to the patient's satisfaction.     Audreyana Huntsberry R

## 2012-10-21 NOTE — CV Procedure (Addendum)
PROCEDURE:  Left heart catheterization with selective coronary angiography, left ventriculogram.  INDICATIONS:  Chest pain with ischemia on nuclear stress test  The risks, benefits, and details of the procedure were explained to the patient.  The patient verbalized understanding and wanted to proceed.  Informed written consent was obtained.  PROCEDURE TECHNIQUE:  After Xylocaine anesthesia a 55F sheath was placed in the right femoral artery with a single anterior needle wall stick.   Left coronary angiography was done using a Judkins L4 guide catheter.  Right coronary angiography was done using a Judkins R4 guide catheter.  Left ventriculography was done using a pigtail catheter.    CONTRAST:  Total of 80 cc.  COMPLICATIONS:  None.    HEMODYNAMICS:  Aortic pressure was 126/40mmHg; LV pressure was 124/57mmHg; LVEDP 109mmHg.  There was no gradient between the left ventricle and aorta.    ANGIOGRAPHIC DATA:   The left main coronary artery is widely patent and very short with a dual ostium for the LAD and left circumflex.  The left anterior descending artery is widely patent.  It gives rise to a first diagonal which is small and widely patent It then gives rise to aseveral small  Diagonal branches (D2, D3 and D4) which are small and patent.  The left circumflex artery is widely patent.  It gives rise to a small OM1 which is patent and then a second OM which is moderate in size and patent.  The ongoing left circumflex is widely patent and gives rise to a large OM3 which is widely patent.  It then give rise to an OM4 which is large and widely patent.  The ongoing left circumflex is patent.  The right coronary artery is widely patent and distally bifurcates into a PDA and PL branches which are patent.  LEFT VENTRICULOGRAM:  Left ventricular angiogram was done in the 30 RAO projection and revealed normal left ventricular wall motion and systolic function with an estimated ejection fraction of 35-40%.   LVEDP was 22 mmHg.  IMPRESSIONS:  1. Normal left main coronary artery. 2. Normal left anterior descending artery and its branches. 3. Normal left circumflex artery and its branches. 4. Normal right coronary artery. 5. Normal left ventricular systolic function.  LVEDP 22 mmHg.  Ejection fraction 35-40%. 6.  Increased LVEDP c/w diastolic dysfunction  RECOMMENDATION:   1.  Aggressive medical therapy of nonischemic DCM 2.  D/C bisoprolol 3.  Start Coreg 6.25mg  BID 4.  Start Altace 5mg  daily 5.  Decrease Amlodipine to 5mg  daily. 6.  Stop HCTZ 7.  Start Lasix 20mg  daily 8.  Followup with my NP in 1 week

## 2012-10-21 NOTE — OR Nursing (Signed)
Discharge instructions reviewed and signed, pt stated understanding, ambulated in hall without difficulty, site level 0, transported to friend's car via wheelchair 

## 2012-10-21 NOTE — Progress Notes (Signed)
Bedrest begins @ 0930.  Tegaderm dressing applied to right groin site by Suella Broad, RN, site level 0.

## 2012-11-20 ENCOUNTER — Encounter (HOSPITAL_COMMUNITY): Payer: Self-pay | Admitting: Pharmacist

## 2012-11-28 ENCOUNTER — Other Ambulatory Visit: Payer: Self-pay | Admitting: Obstetrics and Gynecology

## 2012-11-29 ENCOUNTER — Encounter (HOSPITAL_COMMUNITY): Payer: Self-pay

## 2012-11-29 ENCOUNTER — Encounter (HOSPITAL_BASED_OUTPATIENT_CLINIC_OR_DEPARTMENT_OTHER): Payer: Self-pay | Admitting: Anesthesiology

## 2012-11-29 ENCOUNTER — Encounter (HOSPITAL_COMMUNITY)
Admission: RE | Admit: 2012-11-29 | Discharge: 2012-11-29 | Disposition: A | Discharge status: Home / Self Care | Payer: BC Managed Care – PPO | Source: Ambulatory Visit | Attending: Obstetrics and Gynecology | Admitting: Obstetrics and Gynecology

## 2012-11-29 LAB — CBC
Hemoglobin: 12.1 g/dL (ref 12.0–15.0)
MCH: 29.8 pg (ref 26.0–34.0)
MCHC: 32.9 g/dL (ref 30.0–36.0)
MCV: 90.6 fL (ref 78.0–100.0)

## 2012-11-29 LAB — BASIC METABOLIC PANEL
BUN: 16 mg/dL (ref 6–23)
Calcium: 9.3 mg/dL (ref 8.4–10.5)
GFR calc non Af Amer: 76 mL/min — ABNORMAL LOW (ref >90)
Glucose, Bld: 94 mg/dL (ref 70–99)
Potassium: 4.1 mEq/L (ref 3.5–5.1)

## 2012-11-29 NOTE — Pre-Procedure Instructions (Signed)
History and cardiac results reviewed by Drs Royce Macadamia and Seward Speck. Anes. Interview by Dr. Glennon Mac.

## 2012-11-29 NOTE — Patient Instructions (Addendum)
20 Vanessa [name]  11/29/2012   Your procedure is scheduled on:  12/03/11  Enter through the Main Entrance of [facility] [place] up the phone at the desk and dial [phone].   Call this number if you have problems the morning of surgery: [phone]   Remember:   Do not eat food:After Midnight.  Do not drink clear liquids: After Midnight.  Take these medicines the morning of surgery with A SIP OF WATER: Blood pressure medications, do not take vitamins   Do not wear jewelry, make-up or nail polish.  Do not wear lotions, powders, or perfumes. You may wear deodorant.  Do not shave 48 hours prior to surgery.  Do not bring valuables to the hospital.  Contacts, dentures or bridgework may not be worn into surgery.  Leave suitcase in the car. After surgery it may be brought to your room.  For patients admitted to the hospital, checkout time is 11:00 AM the day of discharge.   Patients discharged the day of surgery will not be allowed to drive home.  Name and phone number of your driver: to be decided  Special Instructions: Shower using CHG 2 nights before surgery and the night before surgery.  If you shower the day of surgery use CHG.  Use special wash - you have one bottle of CHG for all showers.  You should use approximately 1/3 of the bottle for each shower.   Please read over the following fact sheets that you were given: Surgical Site Infection Prevention

## 2012-12-02 ENCOUNTER — Ambulatory Visit (HOSPITAL_COMMUNITY): Payer: BC Managed Care – PPO

## 2012-12-02 ENCOUNTER — Encounter (HOSPITAL_COMMUNITY): Payer: Self-pay

## 2012-12-02 ENCOUNTER — Encounter (HOSPITAL_COMMUNITY)
Admission: RE | Disposition: A | Discharge status: Home / Self Care | Payer: Self-pay | Source: Ambulatory Visit | Attending: Obstetrics and Gynecology

## 2012-12-02 ENCOUNTER — Ambulatory Visit (HOSPITAL_COMMUNITY)
Admission: RE | Admit: 2012-12-02 | Discharge: 2012-12-02 | Disposition: A | Discharge status: Home / Self Care | Payer: BC Managed Care – PPO | Source: Ambulatory Visit | Attending: Obstetrics and Gynecology | Admitting: Obstetrics and Gynecology

## 2012-12-02 DIAGNOSIS — Z01812 Encounter for preprocedural laboratory examination: Secondary | ICD-10-CM | POA: Insufficient documentation

## 2012-12-02 DIAGNOSIS — N950 Postmenopausal bleeding: Secondary | ICD-10-CM | POA: Insufficient documentation

## 2012-12-02 DIAGNOSIS — N840 Polyp of corpus uteri: Secondary | ICD-10-CM

## 2012-12-02 DIAGNOSIS — N841 Polyp of cervix uteri: Secondary | ICD-10-CM | POA: Insufficient documentation

## 2012-12-02 DIAGNOSIS — Z01818 Encounter for other preprocedural examination: Secondary | ICD-10-CM | POA: Insufficient documentation

## 2012-12-02 HISTORY — PX: HYSTEROSCOPY WITH D & C: SHX1775

## 2012-12-02 SURGERY — DILATATION AND CURETTAGE /HYSTEROSCOPY
Anesthesia: Spinal | Site: Uterus | Wound class: Clean Contaminated

## 2012-12-02 MED ORDER — PHENYLEPHRINE 40 MCG/ML (10ML) SYRINGE FOR IV PUSH (FOR BLOOD PRESSURE SUPPORT)
PREFILLED_SYRINGE | INTRAVENOUS | Status: AC
  Filled 2012-12-02: qty 5

## 2012-12-02 MED ORDER — SILVER NITRATE-POT NITRATE 75-25 % EX MISC
CUTANEOUS | Status: DC | PRN
  Administered 2012-12-02: 1

## 2012-12-02 MED ORDER — BUPIVACAINE IN DEXTROSE 0.75-8.25 % IT SOLN
INTRATHECAL | Status: DC | PRN
  Administered 2012-12-02: 1.2 mL via INTRATHECAL

## 2012-12-02 MED ORDER — MIDAZOLAM HCL 2 MG/2ML IJ SOLN
INTRAMUSCULAR | Status: AC
  Filled 2012-12-02: qty 2

## 2012-12-02 MED ORDER — EPHEDRINE 5 MG/ML INJ
INTRAVENOUS | Status: AC
  Filled 2012-12-02: qty 10

## 2012-12-02 MED ORDER — BUPIVACAINE HCL (PF) 0.25 % IJ SOLN
INTRAMUSCULAR | Status: DC | PRN
  Administered 2012-12-02: 20 mL

## 2012-12-02 MED ORDER — IBUPROFEN 600 MG PO TABS: 600.0000 mg | ORAL_TABLET | Freq: Four times a day (QID) | ORAL | Status: DC | PRN

## 2012-12-02 MED ORDER — GLYCINE 1.5 % IR SOLN
Status: DC | PRN
  Administered 2012-12-02: 3000 mL

## 2012-12-02 MED ORDER — FENTANYL CITRATE 0.05 MG/ML IJ SOLN
INTRAMUSCULAR | Status: DC | PRN
  Administered 2012-12-02: 50 ug via INTRAVENOUS

## 2012-12-02 MED ORDER — PROPOFOL 10 MG/ML IV EMUL
INTRAVENOUS | Status: DC | PRN
  Administered 2012-12-02: 10 mg via INTRAVENOUS
  Administered 2012-12-02: 20 mg via INTRAVENOUS
  Administered 2012-12-02 (×2): 10 mg via INTRAVENOUS

## 2012-12-02 MED ORDER — LIDOCAINE HCL (CARDIAC) 20 MG/ML IV SOLN
INTRAVENOUS | Status: DC | PRN
  Administered 2012-12-02: 30 mg via INTRAVENOUS

## 2012-12-02 MED ORDER — ONDANSETRON HCL 4 MG/2ML IJ SOLN
INTRAMUSCULAR | Status: DC | PRN
  Administered 2012-12-02: 4 mg via INTRAVENOUS

## 2012-12-02 MED ORDER — LACTATED RINGERS IV SOLN
INTRAVENOUS | Status: DC
  Administered 2012-12-02 (×2): via INTRAVENOUS

## 2012-12-02 MED ORDER — PHENYLEPHRINE HCL 10 MG/ML IJ SOLN
INTRAMUSCULAR | Status: DC | PRN
  Administered 2012-12-02 (×2): 40 ug via INTRAVENOUS

## 2012-12-02 MED ORDER — FENTANYL CITRATE 0.05 MG/ML IJ SOLN
INTRAMUSCULAR | Status: AC
  Filled 2012-12-02: qty 2

## 2012-12-02 MED ORDER — ONDANSETRON HCL 4 MG/2ML IJ SOLN
INTRAMUSCULAR | Status: AC
  Filled 2012-12-02: qty 2

## 2012-12-02 MED ORDER — DEXAMETHASONE SODIUM PHOSPHATE 10 MG/ML IJ SOLN
INTRAMUSCULAR | Status: AC
  Filled 2012-12-02: qty 1

## 2012-12-02 MED ORDER — MIDAZOLAM HCL 5 MG/5ML IJ SOLN
INTRAMUSCULAR | Status: DC | PRN
  Administered 2012-12-02: 2 mg via INTRAVENOUS

## 2012-12-02 MED ORDER — EPHEDRINE SULFATE 50 MG/ML IJ SOLN
INTRAMUSCULAR | Status: DC | PRN
  Administered 2012-12-02: 5 mg via INTRAVENOUS

## 2012-12-02 MED ORDER — SILVER NITRATE-POT NITRATE 75-25 % EX MISC
CUTANEOUS | Status: AC
  Filled 2012-12-02: qty 1

## 2012-12-02 MED ORDER — BUPIVACAINE HCL (PF) 0.25 % IJ SOLN
INTRAMUSCULAR | Status: AC
  Filled 2012-12-02: qty 30

## 2012-12-02 MED ORDER — DOXYCYCLINE HYCLATE 100 MG PO TABS: 100.0000 mg | ORAL_TABLET | Freq: Every day | ORAL | Status: DC

## 2012-12-02 MED ORDER — OXYCODONE-ACETAMINOPHEN 5-325 MG PO TABS: ORAL_TABLET | ORAL | Status: DC

## 2012-12-02 MED ORDER — PROPOFOL 10 MG/ML IV EMUL
INTRAVENOUS | Status: AC
  Filled 2012-12-02: qty 20

## 2012-12-02 SURGICAL SUPPLY — 17 items
CANISTER SUCTION 2500CC (MISCELLANEOUS) ×2 IMPLANT
CATH ROBINSON RED A/P 16FR (CATHETERS) ×2 IMPLANT
CLOTH BEACON ORANGE TIMEOUT ST (SAFETY) ×2 IMPLANT
CONTAINER PREFILL 10% NBF 60ML (FORM) ×4 IMPLANT
DRESSING TELFA 8X3 (GAUZE/BANDAGES/DRESSINGS) ×2 IMPLANT
ELECT REM PT RETURN 9FT ADLT (ELECTROSURGICAL) ×2
ELECTRODE REM PT RTRN 9FT ADLT (ELECTROSURGICAL) ×1 IMPLANT
GLOVE BIOGEL M 6.5 STRL (GLOVE) ×4 IMPLANT
GLOVE BIOGEL PI IND STRL 6.5 (GLOVE) ×2 IMPLANT
GLOVE BIOGEL PI INDICATOR 6.5 (GLOVE) ×2
GOWN PREVENTION PLUS XLARGE (GOWN DISPOSABLE) ×2 IMPLANT
GOWN STRL REIN XL XLG (GOWN DISPOSABLE) ×4 IMPLANT
LOOP ANGLED CUTTING 22FR (CUTTING LOOP) IMPLANT
PACK HYSTEROSCOPY LF (CUSTOM PROCEDURE TRAY) ×2 IMPLANT
PAD OB MATERNITY 4.3X12.25 (PERSONAL CARE ITEMS) ×2 IMPLANT
TOWEL OR 17X24 6PK STRL BLUE (TOWEL DISPOSABLE) ×4 IMPLANT
WATER STERILE IRR 1000ML POUR (IV SOLUTION) ×2 IMPLANT

## 2012-12-02 NOTE — Anesthesia Postprocedure Evaluation (Signed)
  Anesthesia Post-op Note  [name]  Procedure(s) Performed: Procedure(s) (LRB) with comments: DILATATION AND CURETTAGE /HYSTEROSCOPY (N/A) - cervical polypectomy  Patient Location: PACU  Anesthesia Type:Spinal  Level of Consciousness: awake, alert  and oriented  Airway and Oxygen Therapy: Patient Spontanous Breathing  Post-op Pain: none  Post-op Assessment: Post-op Vital signs reviewed, Patient's Cardiovascular Status Stable, Respiratory Function Stable, Patent Airway, No signs of Nausea or vomiting, Pain level controlled, No headache and No backache  Post-op Vital Signs: Reviewed and stable  Complications: No apparent anesthesia complications

## 2012-12-02 NOTE — Anesthesia Preprocedure Evaluation (Signed)
Anesthesia Evaluation  Patient identified by MRN, date of birth, ID band Patient awake    Reviewed: Allergy & Precautions, H&P , NPO status , Patient's Chart, lab work & pertinent test results  Airway Mallampati: III TM Distance: >3 FB Neck ROM: Full    Dental No notable dental hx. (+) Teeth Intact   Pulmonary neg pulmonary ROS,  breath sounds clear to auscultation  Pulmonary exam normal       Cardiovascular hypertension, Pt. on medications and Pt. on home beta blockers +CHF + dysrhythmias Rhythm:Regular Rate:Normal + Systolic murmurs PVC's Dilated Cardiomyopathy LVEF 35-40%   Neuro/Psych negative neurological ROS  negative psych ROS   GI/Hepatic negative GI ROS, Neg liver ROS,   Endo/Other  Morbid obesity  Renal/GU negative Renal ROS  negative genitourinary   Musculoskeletal negative musculoskeletal ROS (+)   Abdominal   Peds  Hematology negative hematology ROS (+)   Anesthesia Other Findings   Reproductive/Obstetrics Thickened Endometrium                           Anesthesia Physical Anesthesia Plan  ASA: III  Anesthesia Plan: Spinal   Post-op Pain Management:    Induction:   Airway Management Planned: Natural Airway  Additional Equipment:   Intra-op Plan:   Post-operative Plan:   Informed Consent: I have reviewed the patients History and Physical, chart, labs and discussed the procedure including the risks, benefits and alternatives for the proposed anesthesia with the patient or authorized representative who has indicated his/her understanding and acceptance.   Dental advisory given  Plan Discussed with: Anesthesiologist, Surgeon and CRNA  Anesthesia Plan Comments:         Anesthesia Quick Evaluation

## 2012-12-02 NOTE — Anesthesia Procedure Notes (Signed)
Spinal  Patient location during procedure: OR Start time: 12/02/2012 11:21 AM Staffing Anesthesiologist: Quadarius Henton A. Performed by: anesthesiologist  Preanesthetic Checklist Completed: patient identified, site marked, surgical consent, pre-op evaluation, timeout performed, IV checked, risks and benefits discussed and monitors and equipment checked Spinal Block Patient position: sitting Prep: site prepped and draped and DuraPrep Patient monitoring: heart rate, cardiac monitor, continuous pulse ox and blood pressure Approach: midline Location: L3-4 Injection technique: single-shot Needle Needle type: Sprotte  Needle gauge: 24 G Needle length: 9 cm Needle insertion depth: 5 cm Assessment Sensory level: T8 Additional Notes Patient tolerated procedure well. Adequate sensory level.

## 2012-12-02 NOTE — H&P (Signed)
Date of Initial H&P: 11/14/2012  History reviewed, patient examined, no change in status, stable for surgery.

## 2012-12-02 NOTE — Op Note (Signed)
12/02/2012  12:03 PM  PATIENT:  Vanessa [name]  62 y.o. female  PRE-OPERATIVE DIAGNOSIS:  PMB  POST-OPERATIVE DIAGNOSIS:  post menopausal bleeding  PROCEDURE:  Procedure(s) (LRB) with comments: DILATATION AND CURETTAGE /HYSTEROSCOPY (N/A) - cervical polypectomy  SURGEON:  Surgeon(s) and Role:    * Beau Ramsburg J. Landry Mellow, MD - Primary  PHYSICIAN ASSISTANT: None  ASSISTANTS: none   ANESTHESIA:   spinal  EBL:  Total I/O In: 400 [I.V.:400] Out: 105 [Urine:100; Blood:5]  BLOOD ADMINISTERED:none  DRAINS: none   LOCAL MEDICATIONS USED:  MARCAINE   20 cc   SPECIMEN:  Source of Specimen:  endometrial currettings and polyp   DISPOSITION OF SPECIMEN:  PATHOLOGY  COUNTS:  YES  TOURNIQUET:  * No tourniquets in log *  DICTATION: .Dragon Dictation  PLAN OF CARE: Discharge to home after PACU  PATIENT DISPOSITION:  PACU - hemodynamically stable.   Delay start of Pharmacological VTE agent (>24hrs) due to surgical blood loss or risk of bleeding: not applicable  Indication 62 y/o with postmenopausal bleeding and thickened endometrium on ultrasound.   Findings Large endometrial polyp.   Procedure: Patient was taken to the operating room where spinal anesthetic was placed . She was placed in the dorsal lithotomy position. She was prepped and draped in the usual sterile fashion. A speculum was placed into the vaginal vault. The anterior lip of the cervix was grasped with a single-tooth tenaculum. 10 cc of Quarter percent Marcaine was injected at the 4 and 8:00 positions of the cervix. The cervix was then sounded to 6.5 cm. The cervix was dilated to approximately 6 mm. Diagnostic hysteroscope was inserted. The findings noted above. The hysteroscope was removed. Sharp curette  was introduced and endometrial corrected curettings were obtained. The hysteroscope was then reinserted. The cervix was dilated to 10 mm. The resectoscope was inserted. Pt began coughing therefore the resectoscope was removed.  Uterine polyp forceps were inserted and the polyp was removed with polyp forceps.  There was no evidence of perforation. Hysteroscope was then removed.   The single-tooth tenaculum was removed from the anterior lip of the cervix. Patient was noted to have bleeding from the tenaculum site. Silver nitrate was applied and excellent hemostasis was noted. The speculum was removed from the patient's vagina.   She was awakened from anesthesia taken care at country room awake and in stable condition. Sponge lap and needle counts were correct x2.

## 2012-12-02 NOTE — Transfer of Care (Signed)
Immediate Anesthesia Transfer of Care Note  [name]  Procedure(s) Performed: Procedure(s) (LRB) with comments: DILATATION AND CURETTAGE /HYSTEROSCOPY (N/A) - cervical polypectomy  Patient Location: PACU  Anesthesia Type:Spinal  Level of Consciousness: awake, alert  and oriented  Airway & Oxygen Therapy: Patient Spontanous Breathing and Patient connected to face mask oxygen  Post-op Assessment: Report given to PACU RN and Post -op Vital signs reviewed and stable  Post vital signs: Reviewed and stable  Complications: No apparent anesthesia complications

## 2012-12-05 ENCOUNTER — Encounter (HOSPITAL_COMMUNITY): Payer: Self-pay | Admitting: Obstetrics and Gynecology

## 2012-12-27 ENCOUNTER — Other Ambulatory Visit: Payer: Self-pay | Admitting: Family Medicine

## 2012-12-27 DIAGNOSIS — Z1231 Encounter for screening mammogram for malignant neoplasm of breast: Secondary | ICD-10-CM

## 2013-01-25 ENCOUNTER — Ambulatory Visit
Admission: RE | Admit: 2013-01-25 | Discharge: 2013-01-25 | Disposition: A | Discharge status: Home / Self Care | Payer: BC Managed Care – PPO | Source: Ambulatory Visit | Attending: Family Medicine | Admitting: Family Medicine

## 2013-01-25 DIAGNOSIS — Z1231 Encounter for screening mammogram for malignant neoplasm of breast: Secondary | ICD-10-CM

## 2013-06-08 ENCOUNTER — Other Ambulatory Visit: Payer: Self-pay | Admitting: Physician Assistant

## 2013-06-08 ENCOUNTER — Other Ambulatory Visit (HOSPITAL_COMMUNITY)
Admission: RE | Admit: 2013-06-08 | Discharge: 2013-06-08 | Disposition: A | Discharge status: Home / Self Care | Payer: BC Managed Care – PPO | Source: Ambulatory Visit | Attending: Family Medicine | Admitting: Family Medicine

## 2013-06-08 DIAGNOSIS — Z124 Encounter for screening for malignant neoplasm of cervix: Secondary | ICD-10-CM | POA: Insufficient documentation

## 2013-09-06 ENCOUNTER — Telehealth: Payer: Self-pay | Admitting: Cardiology

## 2013-09-06 MED ORDER — FUROSEMIDE 20 MG PO TABS: 20.0000 mg | ORAL_TABLET | Freq: Every day | ORAL | Status: DC

## 2013-09-06 MED ORDER — RAMIPRIL 5 MG PO CAPS: 5.0000 mg | ORAL_CAPSULE | Freq: Every day | ORAL | Status: DC

## 2013-09-06 MED ORDER — AMLODIPINE BESYLATE 5 MG PO TABS: 5.0000 mg | ORAL_TABLET | Freq: Every day | ORAL | Status: DC

## 2013-09-06 NOTE — Telephone Encounter (Signed)
New Message  Refill furosemide, ramapril, amlodipinebesylate at [facility]/[address]--6refills.

## 2013-09-06 NOTE — Telephone Encounter (Signed)
Rx sent in for pt and pt is aware.  

## 2013-12-14 ENCOUNTER — Telehealth: Payer: Self-pay | Admitting: *Deleted

## 2013-12-14 MED ORDER — CARVEDILOL 12.5 MG PO TABS: 12.5000 mg | ORAL_TABLET | Freq: Two times a day (BID) | ORAL | Status: DC

## 2013-12-14 NOTE — Telephone Encounter (Signed)
Rx sent in for pt.

## 2013-12-14 NOTE — Telephone Encounter (Signed)
Patient requests refill on carvedilol to be sent to [facility] on [address] Thanks, [place]

## 2014-01-01 ENCOUNTER — Other Ambulatory Visit: Payer: Self-pay

## 2014-01-01 DIAGNOSIS — Z1231 Encounter for screening mammogram for malignant neoplasm of breast: Secondary | ICD-10-CM

## 2014-01-26 ENCOUNTER — Ambulatory Visit: Payer: BC Managed Care – PPO

## 2014-02-05 ENCOUNTER — Ambulatory Visit: Payer: BC Managed Care – PPO | Admitting: Cardiology

## 2014-02-09 ENCOUNTER — Ambulatory Visit
Admission: RE | Admit: 2014-02-09 | Discharge: 2014-02-09 | Disposition: A | Discharge status: Home / Self Care | Payer: BC Managed Care – PPO | Source: Ambulatory Visit

## 2014-02-09 DIAGNOSIS — Z1231 Encounter for screening mammogram for malignant neoplasm of breast: Secondary | ICD-10-CM

## 2014-02-20 ENCOUNTER — Encounter: Payer: Self-pay | Admitting: General Surgery

## 2014-02-20 DIAGNOSIS — R0989 Other specified symptoms and signs involving the circulatory and respiratory systems: Secondary | ICD-10-CM

## 2014-02-20 DIAGNOSIS — I340 Nonrheumatic mitral (valve) insufficiency: Secondary | ICD-10-CM | POA: Insufficient documentation

## 2014-02-20 DIAGNOSIS — I493 Ventricular premature depolarization: Secondary | ICD-10-CM | POA: Insufficient documentation

## 2014-02-20 DIAGNOSIS — I429 Cardiomyopathy, unspecified: Secondary | ICD-10-CM

## 2014-02-22 ENCOUNTER — Encounter: Payer: Self-pay | Admitting: Cardiology

## 2014-02-22 ENCOUNTER — Ambulatory Visit (INDEPENDENT_AMBULATORY_CARE_PROVIDER_SITE_OTHER): Payer: BC Managed Care – PPO | Admitting: Cardiology

## 2014-02-22 VITALS — BP 110/72 | HR 50 | Ht 62.0 in | Wt 207.0 lb

## 2014-02-22 DIAGNOSIS — I10 Essential (primary) hypertension: Secondary | ICD-10-CM

## 2014-02-22 DIAGNOSIS — I340 Nonrheumatic mitral (valve) insufficiency: Secondary | ICD-10-CM

## 2014-02-22 DIAGNOSIS — I493 Ventricular premature depolarization: Secondary | ICD-10-CM

## 2014-02-22 DIAGNOSIS — I4949 Other premature depolarization: Secondary | ICD-10-CM

## 2014-02-22 DIAGNOSIS — I509 Heart failure, unspecified: Secondary | ICD-10-CM

## 2014-02-22 DIAGNOSIS — I428 Other cardiomyopathies: Secondary | ICD-10-CM

## 2014-02-22 DIAGNOSIS — I420 Dilated cardiomyopathy: Secondary | ICD-10-CM

## 2014-02-22 DIAGNOSIS — I059 Rheumatic mitral valve disease, unspecified: Secondary | ICD-10-CM

## 2014-02-22 DIAGNOSIS — I5022 Chronic systolic (congestive) heart failure: Secondary | ICD-10-CM

## 2014-02-22 NOTE — Progress Notes (Signed)
[facility], [address] [place], [place]  [address] Phone: [phone] Fax:  [phone]  Date:  02/22/2014   ID:  Vanessa [name], DOB 02-15-1951, MRN [id number]  PCP:  REDMON,NOELLE, PA-C  Cardiologist:  Fransico Him, MD     History of Present Illness: Vanessa [name] is a 63 y.o. female with a history of nonischemic DCM, HTN, PVC's and moderate MR by echo 07/2013 who presents today for followup.  She is doing well.  She denies any chest pain,SOB, DOE, LE edema, dizziness, palpitations or syncope.   Wt Readings from Last 3 Encounters:  02/22/14 207 lb (93.895 kg)  11/29/12 205 lb (92.987 kg)  10/21/12 210 lb (95.255 kg)     Past Medical History  Diagnosis Date  . Hypertension   . Nonischemic cardiomyopathy     EF 27% by cath 2003 and 45% by echo 2012  . Dysrhythmia     PVC's  . Moderate mitral regurgitation     and mild LV dysfunction EF 45 % with Moderate MR by ECHO 2014  . PVC's (premature ventricular contractions)     Current Outpatient Prescriptions  Medication Sig Dispense Refill  . amLODipine (NORVASC) 5 MG tablet Take 1 tablet (5 mg total) by mouth daily.  30 tablet  11  . aspirin 81 MG EC tablet Take 1 tablet (81 mg total) by mouth daily.      Marland Kitchen CALCIUM-VITAMIN D PO Take 1 tablet by mouth daily.      . carvedilol (COREG) 12.5 MG tablet Take 1 tablet (12.5 mg total) by mouth 2 (two) times daily with a meal.  60 tablet  11  . furosemide (LASIX) 20 MG tablet Take 1 tablet (20 mg total) by mouth daily.  30 tablet  11  . ibuprofen (ADVIL) 600 MG tablet Take 1 tablet (600 mg total) by mouth every 6 (six) hours as needed for pain.  30 tablet  0  . ramipril (ALTACE) 5 MG capsule Take 1 capsule (5 mg total) by mouth daily.  30 capsule  11   No current facility-administered medications for this visit.    Allergies:   No Known Allergies  Social History:  The patient  reports that she has never smoked. She does not have any smokeless tobacco history on file. She  reports that she does not drink alcohol or use illicit drugs.   Family History:  The patient's family history includes Breast cancer in her sister.   ROS:  Please see the history of present illness.      All other systems reviewed and negative.   PHYSICAL EXAM: VS:  BP 110/72  Pulse 50  Ht 5\' 2"  (1.575 m)  Wt 207 lb (93.895 kg)  BMI 37.85 kg/m2 Well nourished, well developed, in no acute distress HEENT: normal Neck: no JVD Cardiac:  normal S1, S2; RRR; no murmuroccasional ectopy Lungs:  clear to auscultation bilaterally, no wheezing, rhonchi or rales Abd: soft, nontender, no hepatomegaly Ext: no edema Skin: warm and dry Neuro:  CNs 2-12 intact, no focal abnormalities noted     EKG:  NSR with occasional PVC's, LVH by voltage with repolarization abnormality  ASSESSMENT AND PLAN:  1. Nonischemic DCM appears well compensated 2. HTN well controlled - continue amlodipine/ramipril/carvedilol 3. PVC's aymptomatic 4. Chronic systolic CHF - continue Lasix/ACE I/beta blocker 5. Moderate MR by echo 2014 - repeat echo in 1 year  Followup with me in 6 months  Signed, Fransico Him, MD 02/22/2014 3:07 PM

## 2014-02-22 NOTE — Patient Instructions (Signed)
Your physician recommends that you continue on your current medications as directed. Please refer to the Current Medication list given to you today.  Your physician wants you to follow-up in: 6 months with Dr Turner You will receive a reminder letter in the mail two months in advance. If you don't receive a letter, please call our office to schedule the follow-up appointment.  

## 2014-03-15 ENCOUNTER — Encounter: Payer: Self-pay | Admitting: Gastroenterology

## 2014-04-24 ENCOUNTER — Telehealth: Payer: Self-pay | Admitting: Cardiology

## 2014-04-24 NOTE — Telephone Encounter (Signed)
Patient is scheduled for colonoscopy in June, knows that Dr. Radford Pax did not want her to have general anesthesia in the past when she had a hysteroscopy, so she has concerns about having colonoscopy.  Advised her that she will have monitored sedation with colonoscopy, will be awake and able to follow commands but wont remember the procedure.  She has no further questions since finding out she will not be put to sleep all the way for the colonoscopy.

## 2014-04-24 NOTE — Telephone Encounter (Signed)
Follow Up  Pt called to discuss medications// No further details provided// Please call

## 2014-05-08 ENCOUNTER — Ambulatory Visit (AMBULATORY_SURGERY_CENTER): Payer: Self-pay

## 2014-05-08 VITALS — Ht 62.0 in | Wt 211.8 lb

## 2014-05-08 DIAGNOSIS — Z1211 Encounter for screening for malignant neoplasm of colon: Secondary | ICD-10-CM

## 2014-05-08 MED ORDER — MOVIPREP 100 G PO SOLR: ORAL | Status: DC

## 2014-05-08 NOTE — Progress Notes (Signed)
Per pt, no allergies to soy or egg products.Pt not taking any weight loss meds or using  O2 at home.   Pt came into the office today for her pre-visit prior to her colonoscopy with Dr Fuller Plan on 05/22/14.Pt states she had a colonoscopy done over 10 years ago with  Dr Arrie Eastern? Records sent to the office did not have her last colonoscopy report.Evelena Peat on( 3rd floor )checked past records and found the last colonoscopy was done by Dr Henrene Pastor  on 09/13/03.I called pt at home to reschedule her with the correct doctor and explain to her we try to keep the pt with the same doctor.and we would need to reschedule her with Dr Henrene Pastor. The pt was not happy about all the rescheduling and having to wait for her appointdment.I apologized to the pt and informed her we will send her new instructions for her colonoscopy on 06/25/14 at 2:00pm with Dr Henrene Pastor and cancel the appt on 05/22/14 with Dr Fuller Plan. Pt understood. We will put her on the waiting list for an earlier appt. A note will be sent to admitting to have pt sign a new consent with Dr Henrene Pastor.

## 2014-05-09 ENCOUNTER — Encounter: Payer: Self-pay | Admitting: Internal Medicine

## 2014-05-10 ENCOUNTER — Encounter: Payer: Self-pay | Admitting: Gastroenterology

## 2014-05-22 ENCOUNTER — Encounter: Payer: BC Managed Care – PPO | Admitting: Gastroenterology

## 2014-05-25 ENCOUNTER — Encounter: Payer: BC Managed Care – PPO | Admitting: Internal Medicine

## 2014-05-28 ENCOUNTER — Telehealth: Payer: Self-pay | Admitting: Cardiology

## 2014-05-28 NOTE — Telephone Encounter (Signed)
New problem    Pt called and would like to be fit in she thinks she has extra fluid by her heart like last time.   Please give her a call back.

## 2014-05-28 NOTE — Telephone Encounter (Signed)
Follow Up:  Pt states when she gets up in the morning there is a hissing sound in her throat and she believes that means there is fluid in her heart.

## 2014-05-28 NOTE — Telephone Encounter (Signed)
To Dr Turner to advise 

## 2014-05-28 NOTE — Telephone Encounter (Signed)
What are her  symptoms?  

## 2014-05-29 NOTE — Telephone Encounter (Signed)
Please have her see her PCP

## 2014-05-29 NOTE — Telephone Encounter (Signed)
Actually have her go back to taking Lasix 20mg  daily and see her PCP.  She was supposed to be on Lasix 20mg  daily

## 2014-05-29 NOTE — Telephone Encounter (Signed)
Follow Up:  Pt wanted to let Danielle know she made an appt w/ her PcP.

## 2014-05-29 NOTE — Telephone Encounter (Signed)
Noted. Waiting on response about lasix from Dr Radford Pax.

## 2014-05-29 NOTE — Telephone Encounter (Signed)
Pt stated that [name] stated that she should take two of her lasix daily. She did for awhile and stop. Now she wants to know if she should take two again. She stated that she does not have SOB or Swelling. She just said she hears the hissing. I explained she did need to make a PCP appt but I would ask for Dr Landis Gandy advice on the lasix.

## 2014-05-29 NOTE — Telephone Encounter (Signed)
Hold on Lasix until seen by PCP since she has no edema

## 2014-05-29 NOTE — Telephone Encounter (Signed)
Pt is aware and agreed. She sees her PCP this afternoon.  TO Dr Radford Pax to make aware.

## 2014-06-18 ENCOUNTER — Telehealth: Payer: Self-pay | Admitting: Internal Medicine

## 2014-06-18 DIAGNOSIS — Z1211 Encounter for screening for malignant neoplasm of colon: Secondary | ICD-10-CM

## 2014-06-18 MED ORDER — MOVIPREP 100 G PO SOLR: ORAL | Status: DC

## 2014-06-18 NOTE — Telephone Encounter (Signed)
Spoke with patient, resent prescription for MoviPrep to Patient's pharmacy as she requested.

## 2014-06-25 ENCOUNTER — Encounter: Payer: Self-pay | Admitting: Internal Medicine

## 2014-06-25 ENCOUNTER — Ambulatory Visit (AMBULATORY_SURGERY_CENTER): Payer: BC Managed Care – PPO | Admitting: Internal Medicine

## 2014-06-25 VITALS — BP 120/69 | HR 76 | Temp 97.9°F | Resp 14 | Ht 62.0 in | Wt 211.0 lb

## 2014-06-25 DIAGNOSIS — Z1211 Encounter for screening for malignant neoplasm of colon: Secondary | ICD-10-CM

## 2014-06-25 MED ORDER — SODIUM CHLORIDE 0.9 % IV SOLN: 500.0000 mL | INTRAVENOUS | Status: DC

## 2014-06-25 NOTE — Patient Instructions (Signed)
YOU HAD AN ENDOSCOPIC PROCEDURE TODAY AT THE [facility]: Refer to the procedure report that was given to you for any specific questions about what was found during the examination.  If the procedure report does not answer your questions, please call your gastroenterologist to clarify.  If you requested that your care partner not be given the details of your procedure findings, then the procedure report has been included in a sealed envelope for you to review at your convenience later.  YOU SHOULD EXPECT: Some feelings of bloating in the abdomen. Passage of more gas than usual.  Walking can help get rid of the air that was put into your GI tract during the procedure and reduce the bloating. If you had a lower endoscopy (such as a colonoscopy or flexible sigmoidoscopy) you may notice spotting of blood in your stool or on the toilet paper. If you underwent a bowel prep for your procedure, then you may not have a normal bowel movement for a few days.  DIET: Your first meal following the procedure should be a light meal and then it is ok to progress to your normal diet.  A half-sandwich or bowl of soup is an example of a good first meal.  Heavy or fried foods are harder to digest and may make you feel nauseous or bloated.  Likewise meals heavy in dairy and vegetables can cause extra gas to form and this can also increase the bloating.  Drink plenty of fluids but you should avoid alcoholic beverages for 24 hours.  ACTIVITY: Your care partner should take you home directly after the procedure.  You should plan to take it easy, moving slowly for the rest of the day.  You can resume normal activity the day after the procedure however you should NOT DRIVE or use heavy machinery for 24 hours (because of the sedation medicines used during the test).    SYMPTOMS TO REPORT IMMEDIATELY: A gastroenterologist can be reached at any hour.  During normal business hours, 8:30 AM to 5:00 PM Monday through Friday,  call [phone].  After hours and on weekends, please call the GI answering service at [phone] who will take a message and have the physician on call contact you.   Following lower endoscopy (colonoscopy or flexible sigmoidoscopy):  Excessive amounts of blood in the stool  Significant tenderness or worsening of abdominal pains  Swelling of the abdomen that is new, acute  Fever of 100F or higher    FOLLOW UP: If any biopsies were taken you will be contacted by phone or by letter within the next 1-3 weeks.  Call your gastroenterologist if you have not heard about the biopsies in 3 weeks.  Our staff will call the home number listed on your records the next business day following your procedure to check on you and address any questions or concerns that you may have at that time regarding the information given to you following your procedure. This is a courtesy call and so if there is no answer at the home number and we have not heard from you through the emergency physician on call, we will assume that you have returned to your regular daily activities without incident.  SIGNATURES/CONFIDENTIALITY: You and/or your care partner have signed paperwork which will be entered into your electronic medical record.  These signatures attest to the fact that that the information above on your After Visit Summary has been reviewed and is understood.  Full responsibility of the confidentiality   of this discharge information lies with you and/or your care-partner.     

## 2014-06-25 NOTE — Progress Notes (Signed)
Procedure ends, to recovery, report given and VSS. 

## 2014-06-25 NOTE — Op Note (Signed)
[facility] [place]  [employer]. [place], [address]   COLONOSCOPY PROCEDURE REPORT  PATIENT: Vanessa, [name]  MR#: [id number] BIRTHDATE: 01/26/1951 , 86  yrs. old GENDER: Female ENDOSCOPIST: Eustace Quail, MD REFERRED [id number] Recall, PROCEDURE DATE:  06/25/2014 PROCEDURE:   Colonoscopy, screening First Screening Colonoscopy - Avg.  risk and is 50 yrs.  old or older - No.  Prior Negative Screening - Now for repeat screening. 10 or more years since last screening  History of Adenoma - Now for follow-up colonoscopy & has been > or = to 3 yrs.  N/A  Polyps Removed Today? No.  Recommend repeat exam, <10 yrs? No. ASA CLASS:   Class II INDICATIONS:average risk screening.  . Negative index exam October 2004 MEDICATIONS: MAC sedation, administered by CRNA and propofol (Diprivan) 200mg  IV  DESCRIPTION OF PROCEDURE:   After the risks benefits and alternatives of the procedure were thoroughly explained, informed consent was obtained.  A digital rectal exam revealed no abnormalities of the rectum.   The LB [id number]  endoscope was introduced through the anus and advanced to the cecum, which was identified by both the appendix and ileocecal valve. The ileocecal valve was photographed, but the image failed capture. No adverse events experienced.   The quality of the prep was excellent, using MoviPrep  The instrument was then slowly withdrawn as the colon was fully examined.      COLON FINDINGS: Severe diverticulosis was noted throughout the entire examined colon.   The colon was otherwise normal.  There was no  inflammation, polyps or cancers unless previously stated. Retroflexed views revealed internal hemorrhoids. The time to cecum=1 minutes 50 seconds.  Withdrawal time=7 minutes 44 seconds. The scope was withdrawn and the procedure completed.  COMPLICATIONS: There were no complications.  ENDOSCOPIC IMPRESSION: 1.   Severe diverticulosis was noted  throughout the entire examined colon 2.   The colon was otherwise normal  RECOMMENDATIONS: 1. Continue current colorectal screening recommendations for "routine risk" patients with a repeat colonoscopy in 10 years.   eSigned:  Eustace Quail, MD 06/25/2014 2:26 PM   cc: The Patient    ; Barth Kirks Redmon

## 2014-06-26 ENCOUNTER — Telehealth: Payer: Self-pay

## 2014-06-26 NOTE — Telephone Encounter (Signed)
  Follow up Call-  Call back number 06/25/2014  Post procedure Call Back phone  # [phone]  Permission to leave phone message Yes     Patient questions:  Do you have a fever, pain , or abdominal swelling? No. Pain Score  0 *  Have you tolerated food without any problems? Yes.    Have you been able to return to your normal activities? Yes.    Do you have any questions about your discharge instructions: Diet   No. Medications  No. Follow up visit  No.  Do you have questions or concerns about your Care? No.  Actions: * If pain score is 4 or above: No action needed, pain <4.

## 2014-07-25 ENCOUNTER — Telehealth: Payer: Self-pay | Admitting: Cardiology

## 2014-07-25 NOTE — Telephone Encounter (Signed)
Pt called because for the last 2 weeks pt  has been SOB with mild activity just walking to the restroom she gets short of breath. Pt has been taking Lasix 20 mg once daily. Pt wants to be seen soon. Pt has an appointment with Dr. Radford Pax tomorrow 07/26/14 at 8:45 AM pt is aware.

## 2014-07-25 NOTE — Telephone Encounter (Signed)
New Message  Pt called states that she is having a hard time walking.. Whenever she walks she becomes short of breath and extremely tired. Pt requests a call back to discuss.

## 2014-07-26 ENCOUNTER — Encounter: Payer: Self-pay | Admitting: Cardiology

## 2014-07-26 ENCOUNTER — Ambulatory Visit (INDEPENDENT_AMBULATORY_CARE_PROVIDER_SITE_OTHER): Payer: BC Managed Care – PPO | Admitting: Cardiology

## 2014-07-26 VITALS — BP 126/76 | HR 81 | Ht 62.0 in | Wt 208.8 lb

## 2014-07-26 DIAGNOSIS — I428 Other cardiomyopathies: Secondary | ICD-10-CM

## 2014-07-26 DIAGNOSIS — R002 Palpitations: Secondary | ICD-10-CM

## 2014-07-26 DIAGNOSIS — I4949 Other premature depolarization: Secondary | ICD-10-CM

## 2014-07-26 DIAGNOSIS — I493 Ventricular premature depolarization: Secondary | ICD-10-CM

## 2014-07-26 DIAGNOSIS — I509 Heart failure, unspecified: Secondary | ICD-10-CM

## 2014-07-26 DIAGNOSIS — I10 Essential (primary) hypertension: Secondary | ICD-10-CM

## 2014-07-26 DIAGNOSIS — I420 Dilated cardiomyopathy: Secondary | ICD-10-CM

## 2014-07-26 DIAGNOSIS — I5022 Chronic systolic (congestive) heart failure: Secondary | ICD-10-CM

## 2014-07-26 LAB — BASIC METABOLIC PANEL
BUN: 14 mg/dL (ref 6–23)
CALCIUM: 8.6 mg/dL (ref 8.4–10.5)
CO2: 26 mEq/L (ref 19–32)
Chloride: 109 mEq/L (ref 96–112)
Creatinine, Ser: 0.9 mg/dL (ref 0.4–1.2)
GFR: 86.88 mL/min (ref >60.00)
GLUCOSE: 101 mg/dL — AB (ref 70–99)
Potassium: 3.9 mEq/L (ref 3.5–5.1)
Sodium: 141 mEq/L (ref 135–145)

## 2014-07-26 NOTE — Progress Notes (Signed)
[address], [place], [place]  [address] Phone: [phone] Fax:  [phone]  Date:  07/26/2014   ID:  ZOEYA STYERS, DOB 06-Apr-1951, MRN [id number]  PCP:  REDMON,NOELLE, PA-C  Cardiologist:  Fransico Him, MD     History of Present Illness: Vanessa [name] is a 63 y.o. female with a history of nonischemic DCM, HTN, PVC's and moderate MR by echo 07/2013 who presents today for followup. She is doing well. She denies any chest pain,SOB, DOE, LE edema, dizziness, palpitations or syncope.  She says that when she walks she gets very tired.  She has also noticed that she has been palpitations recently.  It does not make her feel bad.  She notices it when she is exerting herself.      Wt Readings from Last 3 Encounters:  07/26/14 208 lb 12.8 oz (94.711 kg)  06/25/14 211 lb (95.709 kg)  05/08/14 211 lb 12.8 oz (96.072 kg)     Past Medical History  Diagnosis Date  . Hypertension   . Nonischemic cardiomyopathy     EF 27% by cath 2003 and 45% by echo 2012  . Dysrhythmia     PVC's  . Moderate mitral regurgitation     and mild LV dysfunction EF 45 % with Moderate MR by ECHO 2014  . PVC's (premature ventricular contractions)     Current Outpatient Prescriptions  Medication Sig Dispense Refill  . amLODipine (NORVASC) 5 MG tablet Take 1 tablet (5 mg total) by mouth daily.  30 tablet  11  . aspirin 81 MG EC tablet Take 1 tablet (81 mg total) by mouth daily.      Marland Kitchen CALCIUM-VITAMIN D PO Take 1 tablet by mouth daily.      . carvedilol (COREG) 12.5 MG tablet Take 1 tablet (12.5 mg total) by mouth 2 (two) times daily with a meal.  60 tablet  11  . furosemide (LASIX) 20 MG tablet Take 1 tablet (20 mg total) by mouth daily.  30 tablet  11  . ramipril (ALTACE) 5 MG capsule Take 1 capsule (5 mg total) by mouth daily.  30 capsule  11   No current facility-administered medications for this visit.    Allergies:   No Known Allergies  Social History:  The patient  reports that she  has never smoked. She has never used smokeless tobacco. She reports that she does not drink alcohol or use illicit drugs.   Family History:  The patient's family history includes Breast cancer in her sister; Heart disease in her father. There is no history of Colon cancer, Esophageal cancer, Rectal cancer, or Stomach cancer.   ROS:  Please see the history of present illness.      All other systems reviewed and negative.   PHYSICAL EXAM: VS:  BP 126/76  Pulse 81  Ht 5\' 2"  (1.575 m)  Wt 208 lb 12.8 oz (94.711 kg)  BMI 38.18 kg/m2 Well nourished, well developed, in no acute distress HEENT: normal Neck: no JVD Cardiac:  normal S1, S2; RRR; 2/6 SM atr LLSB to apex , occasional ectopy Lungs:  clear to auscultation bilaterally, no wheezing, rhonchi or rales Abd: soft, nontender, no hepatomegaly Ext: no edema Skin: warm and dry Neuro:  CNs 2-12 intact, no focal abnormalities noted  EKG:  NSR with PVC's, LAE, nonspecific ST abnormality     ASSESSMENT AND PLAN:  1. Nonischemic DCM appears well compensated 2. HTN well controlled - continue amlodipine/ramipril/carvedilol  3. PVC's  aymptomatic 4. Chronic systolic CHF - continue Lasix/ACE I/beta blocker - check BMET  5. Moderate MR by echo 2014 - I am concerned that she is getting exertional fatigue so we will reassess her MR by echo - repeat echo        6.  Palpitations with history of PVC's but what she is describing sound more rapid - 30 day event monitor to assess  Followup with me in 6 months    Signed, Fransico Him, MD 07/26/2014 9:10 AM

## 2014-07-26 NOTE — Patient Instructions (Signed)
Your physician recommends that you continue on your current medications as directed. Please refer to the Current Medication list given to you today.  Your physician recommends that you go to the lab today for a BMET  Your physician has recommended that you wear an event monitor. Event monitors are medical devices that record the heart's electrical activity. Doctors most often [place] these monitors to diagnose arrhythmias. Arrhythmias are problems with the speed or rhythm of the heartbeat. The monitor is a small, portable device. You can wear one while you do your normal daily activities. This is usually used to diagnose what is causing palpitations/syncope (passing out).  Your physician wants you to follow-up in: 6 months with Dr Mallie Snooks will receive a reminder letter in the mail two months in advance. If you don't receive a letter, please call our office to schedule the follow-up appointment.

## 2014-07-27 ENCOUNTER — Encounter: Payer: Self-pay | Admitting: General Surgery

## 2014-07-30 ENCOUNTER — Encounter (INDEPENDENT_AMBULATORY_CARE_PROVIDER_SITE_OTHER): Payer: BC Managed Care – PPO

## 2014-07-30 ENCOUNTER — Encounter: Payer: Self-pay | Admitting: *Deleted

## 2014-07-30 DIAGNOSIS — R002 Palpitations: Secondary | ICD-10-CM

## 2014-07-30 NOTE — Progress Notes (Signed)
Patient ID: Vanessa [name], female   DOB: 07-06-1951, 64 y.o.   MRN: [id number] [employer] 30 day cardiac event monitor applied to patient.

## 2014-08-13 ENCOUNTER — Ambulatory Visit (HOSPITAL_COMMUNITY): Payer: BC Managed Care – PPO | Attending: Internal Medicine | Admitting: Cardiology

## 2014-08-13 ENCOUNTER — Other Ambulatory Visit (HOSPITAL_COMMUNITY): Payer: Self-pay | Admitting: Physician Assistant

## 2014-08-13 DIAGNOSIS — I359 Nonrheumatic aortic valve disorder, unspecified: Secondary | ICD-10-CM | POA: Insufficient documentation

## 2014-08-13 DIAGNOSIS — I379 Nonrheumatic pulmonary valve disorder, unspecified: Secondary | ICD-10-CM | POA: Insufficient documentation

## 2014-08-13 DIAGNOSIS — I059 Rheumatic mitral valve disease, unspecified: Secondary | ICD-10-CM | POA: Diagnosis not present

## 2014-08-13 DIAGNOSIS — I517 Cardiomegaly: Secondary | ICD-10-CM | POA: Diagnosis not present

## 2014-08-13 DIAGNOSIS — I10 Essential (primary) hypertension: Secondary | ICD-10-CM | POA: Diagnosis not present

## 2014-08-13 DIAGNOSIS — I079 Rheumatic tricuspid valve disease, unspecified: Secondary | ICD-10-CM | POA: Insufficient documentation

## 2014-08-13 DIAGNOSIS — I509 Heart failure, unspecified: Secondary | ICD-10-CM | POA: Insufficient documentation

## 2014-08-13 NOTE — Progress Notes (Signed)
Echo performed. 

## 2014-08-22 ENCOUNTER — Ambulatory Visit: Payer: BC Managed Care – PPO | Admitting: Cardiology

## 2014-08-30 ENCOUNTER — Encounter: Payer: Self-pay | Admitting: Internal Medicine

## 2014-09-04 ENCOUNTER — Telehealth: Payer: Self-pay | Admitting: Cardiology

## 2014-09-04 DIAGNOSIS — I493 Ventricular premature depolarization: Secondary | ICD-10-CM

## 2014-09-04 NOTE — Addendum Note (Signed)
Addended byUlla Potash H on: 09/04/2014 04:09 PM   Modules accepted: Orders

## 2014-09-04 NOTE — Telephone Encounter (Signed)
Pt notified. 24 hour Holter ordered. Msg sent to [facility] to schedule.

## 2014-09-04 NOTE — Telephone Encounter (Signed)
Please let patient know that heart monitor showed NSR with frequent extra heart beats from the bottom of the heart which are benign.  Please order a 24 hour holter monitor to assess PVC load.

## 2014-09-05 ENCOUNTER — Telehealth: Payer: Self-pay | Admitting: Cardiology

## 2014-09-05 NOTE — Telephone Encounter (Signed)
Don't know why I need to wear a monitor again.  Just wore one for 30 days.  Still have areas of my skin is healing from the patches.

## 2014-09-05 NOTE — Telephone Encounter (Signed)
Pt is aware that Dr. Radford Pax wants to assess her PVC seen when she had the 30 days event monitor. Pt is aware that the PCC's will call her to scheduled the 24 hours Holter monitor. Pt verbalized understanding.

## 2014-09-06 ENCOUNTER — Encounter (INDEPENDENT_AMBULATORY_CARE_PROVIDER_SITE_OTHER): Payer: BC Managed Care – PPO

## 2014-09-06 ENCOUNTER — Encounter: Payer: Self-pay | Admitting: Radiology

## 2014-09-06 DIAGNOSIS — I493 Ventricular premature depolarization: Secondary | ICD-10-CM

## 2014-09-06 NOTE — Progress Notes (Signed)
Patient ID: Vanessa [name], female   DOB: [birth year], 63 y.o.   MRN: [id number] [employer] 24 hour holter monitor applied to patient.

## 2014-09-08 ENCOUNTER — Other Ambulatory Visit: Payer: Self-pay | Admitting: Cardiology

## 2014-09-10 ENCOUNTER — Other Ambulatory Visit: Payer: Self-pay | Admitting: *Deleted

## 2014-09-10 MED ORDER — FUROSEMIDE 20 MG PO TABS: 20.0000 mg | ORAL_TABLET | Freq: Every day | ORAL | Status: DC

## 2014-09-14 ENCOUNTER — Other Ambulatory Visit: Payer: Self-pay | Admitting: Cardiology

## 2014-09-14 ENCOUNTER — Telehealth: Payer: Self-pay | Admitting: Cardiology

## 2014-09-14 MED ORDER — RAMIPRIL 5 MG PO CAPS: 5.0000 mg | ORAL_CAPSULE | Freq: Every day | ORAL | Status: DC

## 2014-09-14 NOTE — Telephone Encounter (Signed)
Please let patient know that heart monitor showed NSR with occasional PVC's and PAC's which are benign.  PVC load is 15%.  Please increase Coreg to 25mg  BID and have patient check BP and HR daily for a week and call with results

## 2014-09-14 NOTE — Telephone Encounter (Signed)
Informed patient of heart monitor results per Dr. Radford Pax. Instructed Vanessa [name] to increase her Coreg to 25 mg twice daily. Instructed patient to check her HR and BP daily and to call the office next Friday with results.  Patient agrees with treatment plan.

## 2014-09-20 ENCOUNTER — Other Ambulatory Visit: Payer: Self-pay

## 2014-09-20 MED ORDER — CARVEDILOL 12.5 MG PO TABS: 12.5000 mg | ORAL_TABLET | Freq: Two times a day (BID) | ORAL | Status: DC

## 2014-09-26 ENCOUNTER — Other Ambulatory Visit: Payer: Self-pay

## 2014-09-26 ENCOUNTER — Telehealth: Payer: Self-pay | Admitting: Cardiology

## 2014-09-26 MED ORDER — CARVEDILOL 25 MG PO TABS: 25.0000 mg | ORAL_TABLET | Freq: Two times a day (BID) | ORAL | Status: DC

## 2014-09-26 NOTE — Telephone Encounter (Signed)
New problem   Pt has questions about how to take her carvedilol 12.5mg . Please call pt.

## 2014-09-26 NOTE — Telephone Encounter (Signed)
Pt called to give BP readings after increasing her Coreg to 25 mg BID 10/16. 10/17: 108/ 64 HR 85 10/18: 122/82 HR 90 10/19: 118/80 HR 84 10/20: 119/77 HR 84 10/21: 114/70 HR 72 10/22: 111/72 HR 72 10/23: 116/80 HR 68  Today: 120/71 HR 72  To Dr. Radford Pax for review.

## 2014-09-27 NOTE — Telephone Encounter (Signed)
BP much improved on higher dose of Coreg.  Continue 25mg  BID

## 2014-10-08 ENCOUNTER — Other Ambulatory Visit: Payer: Self-pay | Admitting: *Deleted

## 2014-10-08 MED ORDER — CARVEDILOL 25 MG PO TABS: 25.0000 mg | ORAL_TABLET | Freq: Two times a day (BID) | ORAL | Status: DC

## 2015-01-09 ENCOUNTER — Other Ambulatory Visit: Payer: Self-pay

## 2015-01-09 DIAGNOSIS — Z1231 Encounter for screening mammogram for malignant neoplasm of breast: Secondary | ICD-10-CM

## 2015-01-22 ENCOUNTER — Encounter: Payer: Self-pay | Admitting: Cardiology

## 2015-01-22 ENCOUNTER — Ambulatory Visit (INDEPENDENT_AMBULATORY_CARE_PROVIDER_SITE_OTHER): Payer: No Typology Code available for payment source | Admitting: Cardiology

## 2015-01-22 VITALS — BP 102/70 | HR 91 | Ht 62.0 in | Wt 211.8 lb

## 2015-01-22 DIAGNOSIS — I5022 Chronic systolic (congestive) heart failure: Secondary | ICD-10-CM

## 2015-01-22 DIAGNOSIS — I420 Dilated cardiomyopathy: Secondary | ICD-10-CM

## 2015-01-22 DIAGNOSIS — I270 Primary pulmonary hypertension: Secondary | ICD-10-CM

## 2015-01-22 DIAGNOSIS — I272 Pulmonary hypertension, unspecified: Secondary | ICD-10-CM | POA: Insufficient documentation

## 2015-01-22 DIAGNOSIS — I340 Nonrheumatic mitral (valve) insufficiency: Secondary | ICD-10-CM

## 2015-01-22 DIAGNOSIS — I10 Essential (primary) hypertension: Secondary | ICD-10-CM

## 2015-01-22 DIAGNOSIS — I429 Cardiomyopathy, unspecified: Secondary | ICD-10-CM

## 2015-01-22 DIAGNOSIS — I493 Ventricular premature depolarization: Secondary | ICD-10-CM

## 2015-01-22 LAB — BASIC METABOLIC PANEL
BUN: 14 mg/dL (ref 6–23)
CHLORIDE: 108 meq/L (ref 96–112)
CO2: 30 mEq/L (ref 19–32)
Calcium: 9 mg/dL (ref 8.4–10.5)
Creatinine, Ser: 0.83 mg/dL (ref 0.40–1.20)
GFR: 89.16 mL/min (ref >60.00)
GLUCOSE: 109 mg/dL — AB (ref 70–99)
POTASSIUM: 4.2 meq/L (ref 3.5–5.1)
Sodium: 142 mEq/L (ref 135–145)

## 2015-01-22 LAB — BRAIN NATRIURETIC PEPTIDE: PRO B NATRI PEPTIDE: 1612 pg/mL — AB (ref 0.0–100.0)

## 2015-01-22 NOTE — Progress Notes (Signed)
Cardiology Office Note   Date:  01/22/2015   ID:  Vanessa [name], DOB Dec 05, 1950, MRN [id number]  PCP:  REDMON,NOELLE, PA-C  Cardiologist:   Sueanne Margarita, MD   Chief Complaint  Patient presents with  . Mitral Regurgitation  . Cardiomyopathy  . Hypertension      History of Present Illness: Vanessa [name] is a 64 y.o. female with a history of nonischemic DCM EF 30-35% by echo, HTN, PVC's and moderate to severe MR by echo 07/2014 who presents today for followup. She is doing well. She denies any chest pain, LE edema, dizziness, palpitations or syncope. She says that recently she has had a lot more SOB when she exerts herself and has to sit down.  She denies any LE edema but is on a diuretic.     Past Medical History  Diagnosis Date  . Hypertension   . Nonischemic cardiomyopathy     EF 30-35% by echo 2015  . Dysrhythmia     PVC's  . Moderate mitral regurgitation     moderate to severe MR with moderate pulmonary HTN  . PVC's (premature ventricular contractions)   . Pulmonary HTN     moderate with PASP 65mmHg by echo 2015    Past Surgical History  Procedure Laterality Date  . Hysteroscopy w/d&c  12/02/2012    Procedure: DILATATION AND CURETTAGE /HYSTEROSCOPY;  Surgeon: Maeola Sarah. Landry Mellow, MD;  Location: [facility];  Service: Gynecology;  Laterality: N/A;  cervical polypectomy  . Cardiac catheterization  2003    normal coronary arteries  . Cardiac catheterization  2013  Nov    h/o nonischemic DCM EF 35-40% increase LVEDP      Current Outpatient Prescriptions  Medication Sig Dispense Refill  . amLODipine (NORVASC) 5 MG tablet TAKE ONE TABLET BY MOUTH ONCE DAILY 30 tablet 11  . aspirin 81 MG EC tablet Take 1 tablet (81 mg total) by mouth daily.    Marland Kitchen CALCIUM-VITAMIN D PO Take 1 tablet by mouth daily.    . carvedilol (COREG) 25 MG tablet Take 1 tablet (25 mg total) by mouth 2 (two) times daily with a meal. 60 tablet 5  . furosemide (LASIX) 20 MG tablet Take 1 tablet (20 mg  total) by mouth daily. 30 tablet 11  . ramipril (ALTACE) 5 MG capsule Take 1 capsule (5 mg total) by mouth daily. 30 capsule 11   No current facility-administered medications for this visit.    Allergies:   Review of patient's allergies indicates no known allergies.    Social History:  The patient  reports that she has never smoked. She has never used smokeless tobacco. She reports that she does not drink alcohol or use illicit drugs.   Family History:  The patient's family history includes Breast cancer in her sister; Heart disease in her father. There is no history of Colon cancer, Esophageal cancer, Rectal cancer, or Stomach cancer.    ROS:  Please see the history of present illness.   Otherwise, review of systems are positive for none.   All other systems are reviewed and negative.    PHYSICAL EXAM: VS:  BP 102/70 mmHg  Pulse 91  Ht 5\' 2"  (1.575 m)  Wt 211 lb 12.8 oz (96.072 kg)  BMI 38.73 kg/m2  SpO2 96% , BMI Body mass index is 38.73 kg/(m^2). GEN: Well nourished, well developed, in no acute distress HEENT: normal Neck: no JVD, carotid bruits, or masses Cardiac: RRR; no murmurs, rubs, or gallops,no edema  Respiratory:  clear to auscultation bilaterally, normal work of breathing GI: soft, nontender, nondistended, + BS MS: no deformity or atrophy Skin: warm and dry, no rash Neuro:  Strength and sensation are intact Psych: euthymic mood, full affect   EKG:  EKG is not ordered today.    Recent Labs: 07/26/2014: BUN 14; Creatinine 0.9; Potassium 3.9; Sodium 141    Lipid Panel    Component Value Date/Time   CHOL 193 11/13/2008 2027   TRIG 116 11/13/2008 2027   HDL 55 11/13/2008 2027   CHOLHDL 3.5 Ratio 11/13/2008 2027   VLDL 23 11/13/2008 2027   LDLCALC 115* 11/13/2008 2027      Wt Readings from Last 3 Encounters:  01/22/15 211 lb 12.8 oz (96.072 kg)  07/26/14 208 lb 12.8 oz (94.711 kg)  06/25/14 211 lb (95.709 kg)     EKG:  NSR with PVC's and nonspecific  ST abnormality ASSESSMENT AND PLAN:  1. Nonischemic DCM appears well compensated 2. HTN well controlled - continue amlodipine/ramipril/carvedilol  3. PVC's asymptomatic on beta blocker.   4. Chronic systolic CHF - continue Lasix/ACE I/beta blocker - check BMET        5.   Moderate to severe MR by echo 07/2014 - I am going to repeat echo to reevaluate MR given her worsening SOB.  I will also check a BNP   Current medicines are reviewed at length with the patient today.  The patient does not have concerns regarding medicines.  The following changes have been made:  no change  Labs/ tests ordered today include: 2D echo, BNP, BMET    Disposition:   FU with me in 3 months   Signed, Sueanne Margarita, MD  01/22/2015 8:16 AM    [facility] [place], [place],   [address] Phone: [phone]; Fax: [phone]

## 2015-01-22 NOTE — Patient Instructions (Addendum)
Your physician recommends that you have lab work TODAY (BMET, BNP)  Your physician has requested that you have an echocardiogram THIS WEEK. Echocardiography is a painless test that uses sound waves to create images of your heart. It provides your doctor with information about the size and shape of your heart and how well your heart's chambers and valves are working. This procedure takes approximately one hour. There are no restrictions for this procedure.  Your physician recommends that you schedule a follow-up appointment in: 3 months with Dr. Radford Pax.

## 2015-01-23 ENCOUNTER — Telehealth: Payer: Self-pay | Admitting: Cardiology

## 2015-01-23 DIAGNOSIS — I5022 Chronic systolic (congestive) heart failure: Secondary | ICD-10-CM

## 2015-01-23 NOTE — Addendum Note (Signed)
Addended by: Harland German A on: 01/23/2015 06:04 PM   Modules accepted: Orders

## 2015-01-23 NOTE — Addendum Note (Signed)
Addended by: Fransico Him R on: 01/23/2015 10:40 AM   Modules accepted: Miquel Dunn

## 2015-01-23 NOTE — Telephone Encounter (Signed)
Instructed patient to INCREASE Lasix to 40 mg BID for 3 days. Appointment made with NP on Monday per patient request. BMET scheduled for Monday as well. Patient agrees with treatment plan.

## 2015-01-23 NOTE — Telephone Encounter (Signed)
BNP elevated - please have patient increase Lasix to 40mg  BID for 3 days and then followup with PA on Friday.  Get BMET on Friday as well

## 2015-01-24 ENCOUNTER — Ambulatory Visit (HOSPITAL_COMMUNITY): Payer: No Typology Code available for payment source | Attending: Cardiology

## 2015-01-24 DIAGNOSIS — I493 Ventricular premature depolarization: Secondary | ICD-10-CM | POA: Diagnosis not present

## 2015-01-24 DIAGNOSIS — I420 Dilated cardiomyopathy: Secondary | ICD-10-CM

## 2015-01-24 DIAGNOSIS — I340 Nonrheumatic mitral (valve) insufficiency: Secondary | ICD-10-CM | POA: Diagnosis not present

## 2015-01-24 DIAGNOSIS — I5022 Chronic systolic (congestive) heart failure: Secondary | ICD-10-CM | POA: Diagnosis not present

## 2015-01-24 DIAGNOSIS — I059 Rheumatic mitral valve disease, unspecified: Secondary | ICD-10-CM

## 2015-01-24 DIAGNOSIS — I429 Cardiomyopathy, unspecified: Secondary | ICD-10-CM | POA: Diagnosis not present

## 2015-01-24 NOTE — Progress Notes (Signed)
2D Echo completed. 01/24/2015

## 2015-01-28 ENCOUNTER — Ambulatory Visit
Admission: RE | Admit: 2015-01-28 | Discharge: 2015-01-28 | Disposition: A | Discharge status: Home / Self Care | Payer: No Typology Code available for payment source | Source: Ambulatory Visit | Attending: Nurse Practitioner | Admitting: Nurse Practitioner

## 2015-01-28 ENCOUNTER — Other Ambulatory Visit (INDEPENDENT_AMBULATORY_CARE_PROVIDER_SITE_OTHER): Payer: No Typology Code available for payment source | Admitting: *Deleted

## 2015-01-28 ENCOUNTER — Other Ambulatory Visit: Payer: Self-pay | Admitting: Nurse Practitioner

## 2015-01-28 ENCOUNTER — Ambulatory Visit (INDEPENDENT_AMBULATORY_CARE_PROVIDER_SITE_OTHER): Payer: No Typology Code available for payment source | Admitting: Nurse Practitioner

## 2015-01-28 ENCOUNTER — Encounter: Payer: Self-pay | Admitting: Nurse Practitioner

## 2015-01-28 VITALS — BP 120/78 | HR 89 | Ht 62.0 in | Wt 213.1 lb

## 2015-01-28 DIAGNOSIS — I10 Essential (primary) hypertension: Secondary | ICD-10-CM

## 2015-01-28 DIAGNOSIS — I420 Dilated cardiomyopathy: Secondary | ICD-10-CM

## 2015-01-28 DIAGNOSIS — I272 Pulmonary hypertension, unspecified: Secondary | ICD-10-CM

## 2015-01-28 DIAGNOSIS — I5022 Chronic systolic (congestive) heart failure: Secondary | ICD-10-CM

## 2015-01-28 DIAGNOSIS — I428 Other cardiomyopathies: Secondary | ICD-10-CM

## 2015-01-28 DIAGNOSIS — R0600 Dyspnea, unspecified: Secondary | ICD-10-CM

## 2015-01-28 DIAGNOSIS — I429 Cardiomyopathy, unspecified: Secondary | ICD-10-CM

## 2015-01-28 DIAGNOSIS — I340 Nonrheumatic mitral (valve) insufficiency: Secondary | ICD-10-CM

## 2015-01-28 DIAGNOSIS — I270 Primary pulmonary hypertension: Secondary | ICD-10-CM

## 2015-01-28 LAB — BASIC METABOLIC PANEL
BUN: 18 mg/dL (ref 6–23)
CO2: 30 mEq/L (ref 19–32)
Calcium: 9 mg/dL (ref 8.4–10.5)
Chloride: 108 mEq/L (ref 96–112)
Creatinine, Ser: 0.88 mg/dL (ref 0.40–1.20)
GFR: 83.34 mL/min (ref >60.00)
Glucose, Bld: 103 mg/dL — ABNORMAL HIGH (ref 70–99)
Potassium: 4 mEq/L (ref 3.5–5.1)
Sodium: 141 mEq/L (ref 135–145)

## 2015-01-28 LAB — BRAIN NATRIURETIC PEPTIDE: Pro B Natriuretic peptide (BNP): 1696 pg/mL — ABNORMAL HIGH (ref 0.0–100.0)

## 2015-01-28 MED ORDER — DIAZEPAM 2 MG PO TABS: 5.0000 mg | ORAL_TABLET | ORAL | Status: DC

## 2015-01-28 MED ORDER — RAMIPRIL 5 MG PO CAPS: 5.0000 mg | ORAL_CAPSULE | Freq: Two times a day (BID) | ORAL | Status: DC

## 2015-01-28 MED ORDER — FUROSEMIDE 40 MG PO TABS: 40.0000 mg | ORAL_TABLET | Freq: Every day | ORAL | Status: DC

## 2015-01-28 NOTE — Progress Notes (Signed)
CARDIOLOGY OFFICE NOTE  Date:  01/28/2015    [name] Date of Birth: [birth year] Medical Record [id number]  PCP:  REDMON,NOELLE, PA-C  Cardiologist:  Radford Pax    Chief Complaint  Patient presents with  . Congestive Heart Failure    Follow up visit - seen for Dr. Radford Pax.      History of Present Illness: Vanessa [name] is a 64 y.o. female who presents today for a follow up visit. She is seen for Dr. Radford Pax.  She has a history of nonischemic DCM EF 30-35% by echo, HTN, PVC's and moderate to severe MR by echo 07/2014. She has no CAD by cath from 2003 and 2013.   Seen here a week ago with more dyspnea. Echo updated. EF has dropped with severe pulmonary HTN. Dr. Radford Pax has advised left and right heart catheterization. Her Lasix was doubled for 3 days.   Comes back today. Here alone. She notes that she did better for the few days that she increased her Lasix. Now "back short of breath". Not happy about her echo results. She is short of breath with activities. No chest pain. Tries to restrict her salt. Does not weigh daily. Not dizzy or lightheaded.   Past Medical History  Diagnosis Date  . Hypertension   . Nonischemic cardiomyopathy     EF 30-35% by echo 2015  . Dysrhythmia     PVC's  . Moderate mitral regurgitation     moderate to severe MR with moderate pulmonary HTN  . PVC's (premature ventricular contractions)   . Pulmonary HTN     moderate with PASP 62mmHg by echo 2015    Past Surgical History  Procedure Laterality Date  . Hysteroscopy w/d&c  12/02/2012    Procedure: DILATATION AND CURETTAGE /HYSTEROSCOPY;  Surgeon: Maeola Sarah. Landry Mellow, MD;  Location: [facility];  Service: Gynecology;  Laterality: N/A;  cervical polypectomy  . Cardiac catheterization  2003    normal coronary arteries  . Cardiac catheterization  2013  Nov    h/o nonischemic DCM EF 35-40% increase LVEDP      Medications: Current Outpatient Prescriptions  Medication Sig Dispense Refill  . aspirin 81  MG EC tablet Take 1 tablet (81 mg total) by mouth daily.    Marland Kitchen CALCIUM-VITAMIN D PO Take 1 tablet by mouth daily.    . carvedilol (COREG) 25 MG tablet Take 1 tablet (25 mg total) by mouth 2 (two) times daily with a meal. 60 tablet 5  . ramipril (ALTACE) 5 MG capsule Take 1 capsule (5 mg total) by mouth 2 (two) times daily. 60 capsule 11  . furosemide (LASIX) 40 MG tablet Take 1 tablet (40 mg total) by mouth daily. 30 tablet 11   No current facility-administered medications for this visit.    Allergies: No Known Allergies  Social History: The patient  reports that she has never smoked. She has never used smokeless tobacco. She reports that she does not drink alcohol or use illicit drugs.   Family History: The patient's family history includes Breast cancer in her sister; Heart disease in her father. There is no history of Colon cancer, Esophageal cancer, Rectal cancer, or Stomach cancer.   Review of Systems: Please see the history of present illness.   Otherwise, the review of systems is positive for DOE.   All other systems are reviewed and negative.   Physical Exam: VS:  BP 120/78 mmHg  Pulse 89  Ht 5\' 2"  (1.575 m)  Wt  213 lb 1.9 oz (96.671 kg)  BMI 38.97 kg/m2  SpO2 99% .  BMI Body mass index is 38.97 kg/(m^2).  Wt Readings from Last 3 Encounters:  01/28/15 213 lb 1.9 oz (96.671 kg)  01/22/15 211 lb 12.8 oz (96.072 kg)  07/26/14 208 lb 12.8 oz (94.711 kg)    General: Pleasant. Obese black female who is in no acute distress.  HEENT: Normal. Neck: Supple, no JVD, carotid bruits, or masses noted.  Cardiac: Regular rate and rhythm. Systolic murmur noted. She has 2+ bilaterally.   Respiratory:  Lungs are clear to auscultation bilaterally with normal work of breathing.  GI: Soft and nontender.  MS: No deformity or atrophy. Gait and ROM intact. Skin: Warm and dry. Color is normal.  Neuro:  Strength and sensation are intact and no gross focal deficits noted.  Psych: Alert,  appropriate and with normal affect.   LABORATORY DATA:  EKG:  EKG is not ordered today.   Lab Results  Component Value Date   WBC 4.8 11/29/2012   HGB 12.1 11/29/2012   HCT 36.8 11/29/2012   PLT 214 11/29/2012   GLUCOSE 109* 01/22/2015   CHOL 193 11/13/2008   TRIG 116 11/13/2008   HDL 55 11/13/2008   LDLCALC 115* 11/13/2008   ALT 9 11/13/2008   AST 9 11/13/2008   NA 142 01/22/2015   K 4.2 01/22/2015   CL 108 01/22/2015   CREATININE 0.83 01/22/2015   BUN 14 01/22/2015   CO2 30 01/22/2015   TSH 0.600 11/13/2008    BNP (last 3 results) No results for input(s): BNP in the last 8760 hours.  ProBNP (last 3 results)  Recent Labs  01/22/15 0828  PROBNP 1612.0*     Other Studies Reviewed Today:  Echo Study Conclusions from 01/24/2015  - Left ventricle: The cavity size was severely dilated. Systolic function was severely reduced. The estimated ejection fraction was in the range of 25% to 30%. Moderate diffuse hypokinesis. - Aortic valve: Trileaflet; normal thickness, mildly calcified leaflets. - Mitral valve: There was moderate regurgitation directed eccentrically and toward the free wall. Valve area by continuity equation (using LVOT flow): 0.83 cm^2. - Left atrium: The atrium was severely dilated. - Pulmonic valve: There was trivial regurgitation. - Pulmonary arteries: PA peak pressure: 76 mm Hg (S).  Impressions:  - The right ventricular systolic pressure was increased consistent with severe pulmonary hypertension.  Echo Study Conclusions from 07/2014  - Left ventricle: The cavity size was severely dilated. Wall thickness was normal. Systolic function was moderately to severely reduced. The estimated ejection fraction was in the range of 30% to 35%. - Aortic valve: There was mild regurgitation. - Mitral valve: There was moderate to severe regurgitation. - Left atrium: The atrium was severely dilated. - Right ventricle: Systolic function  was moderately reduced. - Right atrium: The atrium was mildly dilated. - Pulmonary arteries: PA peak pressure: 67 mm Hg (S). - Impressions: Echo images not available for comparison from September 2014. But, on review of reprot, LV systolic function appears worse and MR appears more severe.   CARDIAC CATH IMPRESSIONS FROM 2013:  1. Normal left main coronary artery. 2. Normal left anterior descending artery and its branches. 3. Normal left circumflex artery and its branches. 4. Normal right coronary artery. 5. Normal left ventricular systolic function. LVEDP 22 mmHg. Ejection fraction 35-40%. 6. Increased LVEDP c/w diastolic dysfunction    Assessment/Plan: 1. Chronic LV systolic HF - will increase her Lasix back to 40 mg each  day. Arranging for left and right heart catheterization per Dr. Theodosia Blender recommendation.  Explained need for daily weights and continued salt restriction.   2. NICM -  Worsening EF. Needs medicines maximized. Norvasc stopped. ACE increased today.   3. Worsening pulmonary HTN  4. Moderate MR - arranging for left and right heart catheterization to further assess. She does not wish to proceed before March 15th election. Recheck labs for cath in about 10 days. Sending for CXR as well. EKG done earlier this month.   Current medicines are reviewed with the patient today.  The patient does not have concerns regarding medicines other than what has been noted above.  The following changes have been made:  See above.  Labs/ tests ordered today include:    Orders Placed This Encounter  Procedures  . DG Chest 2 View  . Basic metabolic panel  . Brain natriuretic peptide  . Basic metabolic panel  . CBC  . Protime-INR  . APTT     Disposition:   Further disposition to follow.   Patient is agreeable to this plan and will call if any problems develop in the interim.   Signed: Burtis Junes, RN, ANP-C 01/28/2015 2:11 PM  [facility] [address] [place], [place]  [address] Phone: [phone] Fax: [phone]

## 2015-01-28 NOTE — Patient Instructions (Signed)
We will be checking the following labs today BMET and BNP  Stop the amlodipine  Increase the Altace to 5 mg two times a day - this is sent to your drug store  Increase your Lasix to 40 mg a day - this is sent to your drug store  Try to weigh daily  Keep restricting your salt  Lab in about 10 days (pre procedure labs) -  BMET, CBC, PT, PTT  Please go to [facility] to [place] on the first floor for a chest Xray - you may walk in.   Your provider has recommended a cardiac catheterization -  Will do after March 15th  You are scheduled for a cardiac catheterization on Friday, March 18th at [facility] with Dr. Irish Lack or associate.  Go to [facility] 2nd Floor Short Stay on Friday, March 18th at [place] thru the [employer] entrance A No food or drink after midnight the night before You may take your medications with a sip of water on the day of your procedure.   Coronary Angiogram A coronary angiogram, also called coronary angiography, is an X-ray procedure used to look at the arteries in the heart. In this procedure, a dye (contrast dye) is injected through a long, hollow tube (catheter). The catheter is about the size of a piece of cooked spaghetti and is inserted through your groin, wrist, or arm. The dye is injected into each artery, and X-rays are then taken to show if there is a blockage in the arteries of your heart.  LET [facility] CARE PROVIDER KNOW ABOUT:  Any allergies you have, including allergies to shellfish or contrast dye.   All medicines you are taking, including vitamins, herbs, eye drops, creams, and over-the-counter medicines.   Previous problems you or members of your family have had with the use of anesthetics.   Any blood disorders you have.   Previous surgeries you have had.  History of kidney problems or failure.   Other medical conditions you have.  RISKS AND COMPLICATIONS  Generally, a coronary angiogram is a safe procedure.  However, about 1 person out of 1000 can have problems that may include:  Allergic reaction to the dye.  Bleeding/bruising from the access site or other locations.  Kidney injury, especially in people with impaired kidney function.  Stroke (rare).  Heart attack (rare).  Irregular rhythms (rare)  Death (rare)  BEFORE THE PROCEDURE   Do not eat or drink anything after midnight the night before the procedure or as directed by your health care provider.   Ask your health care provider about changing or stopping your regular medicines. This is especially important if you are taking diabetes medicines or blood thinners.  PROCEDURE  You may be given a medicine to help you relax (sedative) before the procedure. This medicine is given through an intravenous (IV) access tube that is inserted into one of your veins.   The area where the catheter will be inserted will be washed and shaved. This is usually done in the groin but may be done in the fold of your arm (near your elbow) or in the wrist.   A medicine will be given to numb the area where the catheter will be inserted (local anesthetic).   The health care provider will insert the catheter into an artery. The catheter will be guided by using a special type of X-ray (fluoroscopy) of the blood vessel being examined.   A special dye will then be  injected into the catheter, and X-rays will be taken. The dye will help to show where any narrowing or blockages are located in the heart arteries.    AFTER THE PROCEDURE   If the procedure is done through the leg, you will be kept in bed lying flat for several hours. You will be instructed to not bend or cross your legs.  The insertion site will be checked frequently.   The pulse in your feet or wrist will be checked frequently.   Additional blood tests, X-rays, and an electrocardiogram may be done.

## 2015-02-11 ENCOUNTER — Other Ambulatory Visit (INDEPENDENT_AMBULATORY_CARE_PROVIDER_SITE_OTHER): Payer: No Typology Code available for payment source | Admitting: *Deleted

## 2015-02-11 ENCOUNTER — Ambulatory Visit
Admission: RE | Admit: 2015-02-11 | Discharge: 2015-02-11 | Disposition: A | Discharge status: Home / Self Care | Payer: No Typology Code available for payment source | Source: Ambulatory Visit

## 2015-02-11 DIAGNOSIS — I5022 Chronic systolic (congestive) heart failure: Secondary | ICD-10-CM

## 2015-02-11 DIAGNOSIS — R0600 Dyspnea, unspecified: Secondary | ICD-10-CM

## 2015-02-11 DIAGNOSIS — I270 Primary pulmonary hypertension: Secondary | ICD-10-CM

## 2015-02-11 DIAGNOSIS — Z1231 Encounter for screening mammogram for malignant neoplasm of breast: Secondary | ICD-10-CM

## 2015-02-11 DIAGNOSIS — I272 Pulmonary hypertension, unspecified: Secondary | ICD-10-CM

## 2015-02-11 DIAGNOSIS — I10 Essential (primary) hypertension: Secondary | ICD-10-CM

## 2015-02-11 DIAGNOSIS — I340 Nonrheumatic mitral (valve) insufficiency: Secondary | ICD-10-CM

## 2015-02-11 DIAGNOSIS — I429 Cardiomyopathy, unspecified: Secondary | ICD-10-CM

## 2015-02-11 DIAGNOSIS — I420 Dilated cardiomyopathy: Secondary | ICD-10-CM

## 2015-02-11 LAB — CBC
HCT: 35 % — ABNORMAL LOW (ref 36.0–46.0)
Hemoglobin: 11.7 g/dL — ABNORMAL LOW (ref 12.0–15.0)
MCHC: 33.4 g/dL (ref 30.0–36.0)
MCV: 88 fl (ref 78.0–100.0)
Platelets: 189 10*3/uL (ref 150.0–400.0)
RBC: 3.97 Mil/uL (ref 3.87–5.11)
RDW: 14.8 % (ref 11.5–15.5)
WBC: 4.2 10*3/uL (ref 4.0–10.5)

## 2015-02-11 LAB — PROTIME-INR
INR: 1.2 ratio — ABNORMAL HIGH (ref 0.8–1.0)
Prothrombin Time: 13 s (ref 9.6–13.1)

## 2015-02-11 LAB — BASIC METABOLIC PANEL
BUN: 22 mg/dL (ref 6–23)
CO2: 31 mEq/L (ref 19–32)
Calcium: 8.9 mg/dL (ref 8.4–10.5)
Chloride: 106 mEq/L (ref 96–112)
Creatinine, Ser: 0.98 mg/dL (ref 0.40–1.20)
GFR: 73.59 mL/min (ref >60.00)
Glucose, Bld: 98 mg/dL (ref 70–99)
Potassium: 4.2 mEq/L (ref 3.5–5.1)
Sodium: 141 mEq/L (ref 135–145)

## 2015-02-11 LAB — APTT: aPTT: 28.8 s (ref 23.4–32.7)

## 2015-02-12 ENCOUNTER — Other Ambulatory Visit: Payer: Self-pay | Admitting: Physician Assistant

## 2015-02-12 DIAGNOSIS — R928 Other abnormal and inconclusive findings on diagnostic imaging of breast: Secondary | ICD-10-CM

## 2015-02-14 ENCOUNTER — Ambulatory Visit
Admission: RE | Admit: 2015-02-14 | Discharge: 2015-02-14 | Disposition: A | Discharge status: Home / Self Care | Payer: No Typology Code available for payment source | Source: Ambulatory Visit | Attending: Physician Assistant | Admitting: Physician Assistant

## 2015-02-14 DIAGNOSIS — R928 Other abnormal and inconclusive findings on diagnostic imaging of breast: Secondary | ICD-10-CM

## 2015-02-15 ENCOUNTER — Encounter (HOSPITAL_COMMUNITY)
Admission: RE | Disposition: A | Discharge status: Home / Self Care | Payer: Self-pay | Source: Ambulatory Visit | Attending: Interventional Cardiology

## 2015-02-15 ENCOUNTER — Telehealth: Payer: Self-pay | Admitting: Cardiology

## 2015-02-15 ENCOUNTER — Ambulatory Visit (HOSPITAL_COMMUNITY)
Admission: RE | Admit: 2015-02-15 | Discharge: 2015-02-15 | Disposition: A | Discharge status: Home / Self Care | Payer: No Typology Code available for payment source | Source: Ambulatory Visit | Attending: Interventional Cardiology | Admitting: Interventional Cardiology

## 2015-02-15 ENCOUNTER — Encounter (HOSPITAL_COMMUNITY): Payer: Self-pay | Admitting: Interventional Cardiology

## 2015-02-15 DIAGNOSIS — I5022 Chronic systolic (congestive) heart failure: Secondary | ICD-10-CM

## 2015-02-15 DIAGNOSIS — I272 Other secondary pulmonary hypertension: Secondary | ICD-10-CM | POA: Insufficient documentation

## 2015-02-15 DIAGNOSIS — I10 Essential (primary) hypertension: Secondary | ICD-10-CM | POA: Diagnosis not present

## 2015-02-15 DIAGNOSIS — I428 Other cardiomyopathies: Secondary | ICD-10-CM

## 2015-02-15 DIAGNOSIS — I340 Nonrheumatic mitral (valve) insufficiency: Secondary | ICD-10-CM | POA: Diagnosis not present

## 2015-02-15 DIAGNOSIS — I493 Ventricular premature depolarization: Secondary | ICD-10-CM | POA: Insufficient documentation

## 2015-02-15 DIAGNOSIS — I429 Cardiomyopathy, unspecified: Secondary | ICD-10-CM

## 2015-02-15 DIAGNOSIS — Z7982 Long term (current) use of aspirin: Secondary | ICD-10-CM | POA: Insufficient documentation

## 2015-02-15 DIAGNOSIS — I420 Dilated cardiomyopathy: Secondary | ICD-10-CM | POA: Diagnosis not present

## 2015-02-15 DIAGNOSIS — Z79899 Other long term (current) drug therapy: Secondary | ICD-10-CM | POA: Insufficient documentation

## 2015-02-15 HISTORY — PX: LEFT AND RIGHT HEART CATHETERIZATION WITH CORONARY ANGIOGRAM: SHX5449

## 2015-02-15 SURGERY — LEFT AND RIGHT HEART CATHETERIZATION WITH CORONARY ANGIOGRAM

## 2015-02-15 MED ORDER — SODIUM CHLORIDE 0.9 % IJ SOLN: 3.0000 mL | INTRAMUSCULAR | Status: DC | PRN

## 2015-02-15 MED ORDER — SODIUM CHLORIDE 0.9 % IV SOLN
INTRAVENOUS | Status: DC
  Administered 2015-02-15: 11:00:00 via INTRAVENOUS
  Administered 2015-02-15: 1000 mL via INTRAVENOUS

## 2015-02-15 MED ORDER — LIDOCAINE HCL (PF) 1 % IJ SOLN
INTRAMUSCULAR | Status: AC
  Filled 2015-02-15: qty 30

## 2015-02-15 MED ORDER — HEPARIN (PORCINE) IN NACL 2-0.9 UNIT/ML-% IJ SOLN
INTRAMUSCULAR | Status: AC
  Filled 2015-02-15: qty 1000

## 2015-02-15 MED ORDER — FUROSEMIDE 40 MG PO TABS: 40.0000 mg | ORAL_TABLET | Freq: Two times a day (BID) | ORAL | Status: DC

## 2015-02-15 MED ORDER — SODIUM CHLORIDE 0.9 % IV SOLN: 250.0000 mL | INTRAVENOUS | Status: DC | PRN

## 2015-02-15 MED ORDER — SPIRONOLACTONE 25 MG PO TABS: 12.5000 mg | ORAL_TABLET | Freq: Every day | ORAL | Status: DC

## 2015-02-15 MED ORDER — SODIUM CHLORIDE 0.9 % IV SOLN
INTRAVENOUS | Status: AC
Start: 2015-02-15 — End: 2015-02-15

## 2015-02-15 MED ORDER — FENTANYL CITRATE 0.05 MG/ML IJ SOLN
INTRAMUSCULAR | Status: AC
  Filled 2015-02-15: qty 2

## 2015-02-15 MED ORDER — ASPIRIN 81 MG PO CHEW: 81.0000 mg | CHEWABLE_TABLET | ORAL | Status: DC

## 2015-02-15 MED ORDER — SODIUM CHLORIDE 0.9 % IJ SOLN: 3.0000 mL | Freq: Two times a day (BID) | INTRAMUSCULAR | Status: DC

## 2015-02-15 MED ORDER — MIDAZOLAM HCL 2 MG/2ML IJ SOLN
INTRAMUSCULAR | Status: AC
  Filled 2015-02-15: qty 2

## 2015-02-15 NOTE — H&P (View-Only) (Signed)
CARDIOLOGY OFFICE NOTE  Date:  01/28/2015    Vanessa [name] Date of Birth: [birth year] Medical Record [id number]  PCP:  Vanessa [name]  Cardiologist:  Vanessa [name]    Chief Complaint  Patient presents with  . Congestive Heart Failure    Follow up visit - seen for Dr. Radford [name].      History of Present Illness: Vanessa [name] is a 64 y.o. female who presents today for a follow up visit. She is seen for Dr. Radford [name].  She has a history of nonischemic DCM EF 30-35% by echo, HTN, PVC's and moderate to severe MR by echo 07/2014. She has no CAD by cath from 2003 and 2013.   Seen here a week ago with more dyspnea. Echo updated. EF has dropped with severe pulmonary HTN. Dr. Radford [name] has advised left and right heart catheterization. Her Lasix was doubled for 3 days.   Comes back today. Here alone. She notes that she did better for the few days that she increased her Lasix. Now "back short of breath". Not happy about her echo results. She is short of breath with activities. No chest pain. Tries to restrict her salt. Does not weigh daily. Not dizzy or lightheaded.   Past Medical History  Diagnosis Date  . Hypertension   . Nonischemic cardiomyopathy     EF 30-35% by echo 2015  . Dysrhythmia     PVC's  . Moderate mitral regurgitation     moderate to severe MR with moderate pulmonary HTN  . PVC's (premature ventricular contractions)   . Pulmonary HTN     moderate with PASP 19mmHg by echo 2015    Past Surgical History  Procedure Laterality Date  . Hysteroscopy w/d&c  12/02/2012    Procedure: DILATATION AND CURETTAGE /HYSTEROSCOPY;  Surgeon: Vanessa Sarah. Landry Mellow, MD;  Location: [facility];  Service: Gynecology;  Laterality: N/A;  cervical polypectomy  . Cardiac catheterization  2003    normal coronary arteries  . Cardiac catheterization  2013  Nov    h/o nonischemic DCM EF 35-40% increase LVEDP      Medications: Current Outpatient Prescriptions  Medication Sig Dispense Refill  . aspirin 81  MG EC tablet Take 1 tablet (81 mg total) by mouth daily.    Marland Kitchen CALCIUM-VITAMIN D PO Take 1 tablet by mouth daily.    . carvedilol (COREG) 25 MG tablet Take 1 tablet (25 mg total) by mouth 2 (two) times daily with a meal. 60 tablet 5  . ramipril (ALTACE) 5 MG capsule Take 1 capsule (5 mg total) by mouth 2 (two) times daily. 60 capsule 11  . furosemide (LASIX) 40 MG tablet Take 1 tablet (40 mg total) by mouth daily. 30 tablet 11   No current facility-administered medications for this visit.    Allergies: No Known Allergies  Social History: The patient  reports that she has never smoked. She has never used smokeless tobacco. She reports that she does not drink alcohol or use illicit drugs.   Family History: The patient's family history includes Breast cancer in her sister; Heart disease in her father. There is no history of Colon cancer, Esophageal cancer, Rectal cancer, or Stomach cancer.   Review of Systems: Please see the history of present illness.   Otherwise, the review of systems is positive for DOE.   All other systems are reviewed and negative.   Physical Exam: VS:  BP 120/78 mmHg  Pulse 89  Ht 5\' 2"  (1.575 m)  Wt  213 lb 1.9 oz (96.671 kg)  BMI 38.97 kg/m2  SpO2 99% .  BMI Body mass index is 38.97 kg/(m^2).  Wt Readings from Last 3 Encounters:  01/28/15 213 lb 1.9 oz (96.671 kg)  01/22/15 211 lb 12.8 oz (96.072 kg)  07/26/14 208 lb 12.8 oz (94.711 kg)    General: Pleasant. Obese black female who is in no acute distress.  HEENT: Normal. Neck: Supple, no JVD, carotid bruits, or masses noted.  Cardiac: Regular rate and rhythm. Systolic murmur noted. She has 2+ bilaterally.   Respiratory:  Lungs are clear to auscultation bilaterally with normal work of breathing.  GI: Soft and nontender.  MS: No deformity or atrophy. Gait and ROM intact. Skin: Warm and dry. Color is normal.  Neuro:  Strength and sensation are intact and no gross focal deficits noted.  Psych: Alert,  appropriate and with normal affect.   LABORATORY DATA:  EKG:  EKG is not ordered today.   Lab Results  Component Value Date   WBC 4.8 11/29/2012   HGB 12.1 11/29/2012   HCT 36.8 11/29/2012   PLT 214 11/29/2012   GLUCOSE 109* 01/22/2015   CHOL 193 11/13/2008   TRIG 116 11/13/2008   HDL 55 11/13/2008   LDLCALC 115* 11/13/2008   ALT 9 11/13/2008   AST 9 11/13/2008   NA 142 01/22/2015   K 4.2 01/22/2015   CL 108 01/22/2015   CREATININE 0.83 01/22/2015   BUN 14 01/22/2015   CO2 30 01/22/2015   TSH 0.600 11/13/2008    BNP (last 3 results) No results for input(s): BNP in the last 8760 hours.  ProBNP (last 3 results)  Recent Labs  01/22/15 0828  PROBNP 1612.0*     Other Studies Reviewed Today:  Echo Study Conclusions from 01/24/2015  - Left ventricle: The cavity size was severely dilated. Systolic function was severely reduced. The estimated ejection fraction was in the range of 25% to 30%. Moderate diffuse hypokinesis. - Aortic valve: Trileaflet; normal thickness, mildly calcified leaflets. - Mitral valve: There was moderate regurgitation directed eccentrically and toward the free wall. Valve area by continuity equation (using LVOT flow): 0.83 cm^2. - Left atrium: The atrium was severely dilated. - Pulmonic valve: There was trivial regurgitation. - Pulmonary arteries: PA peak pressure: 76 mm Hg (S).  Impressions:  - The right ventricular systolic pressure was increased consistent with severe pulmonary hypertension.  Echo Study Conclusions from 07/2014  - Left ventricle: The cavity size was severely dilated. Wall thickness was normal. Systolic function was moderately to severely reduced. The estimated ejection fraction was in the range of 30% to 35%. - Aortic valve: There was mild regurgitation. - Mitral valve: There was moderate to severe regurgitation. - Left atrium: The atrium was severely dilated. - Right ventricle: Systolic function  was moderately reduced. - Right atrium: The atrium was mildly dilated. - Pulmonary arteries: PA peak pressure: 67 mm Hg (S). - Impressions: Echo images not available for comparison from September 2014. But, on review of reprot, LV systolic function appears worse and MR appears more severe.   CARDIAC CATH IMPRESSIONS FROM 2013:  1. Normal left main coronary artery. 2. Normal left anterior descending artery and its branches. 3. Normal left circumflex artery and its branches. 4. Normal right coronary artery. 5. Normal left ventricular systolic function. LVEDP 22 mmHg. Ejection fraction 35-40%. 6. Increased LVEDP c/w diastolic dysfunction    Assessment/Plan: 1. Chronic LV systolic HF - will increase her Lasix back to 40 mg each  day. Arranging for left and right heart catheterization per Dr. Theodosia Blender recommendation.  Explained need for daily weights and continued salt restriction.   2. NICM -  Worsening EF. Needs medicines maximized. Norvasc stopped. ACE increased today.   3. Worsening pulmonary HTN  4. Moderate MR - arranging for left and right heart catheterization to further assess. She does not wish to proceed before March 15th election. Recheck labs for cath in about 10 days. Sending for CXR as well. EKG done earlier this month.   Current medicines are reviewed with the patient today.  The patient does not have concerns regarding medicines other than what has been noted above.  The following changes have been made:  See above.  Labs/ tests ordered today include:    Orders Placed This Encounter  Procedures  . DG Chest 2 View  . Basic metabolic panel  . Brain natriuretic peptide  . Basic metabolic panel  . CBC  . Protime-INR  . APTT     Disposition:   Further disposition to follow.   Patient is agreeable to this plan and will call if any problems develop in the interim.   Signed: Burtis Junes, RN, ANP-C 01/28/2015 2:11 PM  [facility] [address] [place], [place]  [address] Phone: [phone] Fax: [phone]

## 2015-02-15 NOTE — CV Procedure (Addendum)
    PROCEDURE:  Right and Left heart catheterization with selective coronary angiography, left ventriculogram.  INDICATIONS:  Cardiomyopathy, shortness of breath  The risks, benefits, and details of the procedure were explained to the patient.  The patient verbalized understanding and wanted to proceed.  Informed written consent was obtained.  PROCEDURE TECHNIQUE:  After Xylocaine anesthesia, a 7 French sheath was placed in the right common femoral vein using the modified Seldinger technique. After Xylocaine anesthesia a 52F sheath was placed in the right femoral artery with a single anterior needle wall stick.   7 [place] balloontipped Swan-Ganz catheter was advanced to the pulmonary artery under fluoroscopic guidance. Oxygen saturations were performed. Hemodynamic pressure assessment was performed. Left coronary angiography was done using a Judkins L4 guide catheter.  Right coronary angiography was done using a Judkins R4 guide catheter.  Left ventriculography was done using a pigtail catheter. Manual compression will be used for hemostasis.   CONTRAST:  Total of 70 cc.  COMPLICATIONS:  None.    HEMODYNAMICS:  Aortic pressure was 108/72; LV pressure was 110/13; LVEDP 25.  There was no gradient between the left ventricle and aorta.  Right atrial pressure 15/14, mean right atrial pressure 13 mmHg; RV pressure 67/10, RVEDP 13 mmHg; pulmonary artery pressure 70/30, mean pulmonary artery pressure 45 mmHg; pulmonary Where wedge pressure 33/41, mean pulmonary capillary wedge pressure 31; PA saturation 54%, aortic saturation 91%, cardiac output by Fick 4.4 L/m. Cardiac index 2.2.  ANGIOGRAPHIC DATA:   The left main coronary artery is short but widely patent.  The left anterior descending artery is a large vessel which wraps around the apex. There is no significant atherosclerosis in the LAD. There are several small diagonals which are patent.  The left circumflex artery is a large vessel. There are 2  small obtuse marginals which are patent. The third and fourth obtuse marginals are large and widely patent.  The right coronary artery is a large, dominant vessel. The posterior lateral artery is small. The posterior descending artery is large and patent.  LEFT VENTRICULOGRAM:  Left ventricular angiogram was done in the 30 RAO projection and revealed globally decreased left ventricular wall motion and overall severely decreased systolic function with an estimated ejection fraction of 20-25%.  LVEDP was 25 mmHg. 3+ mitral regurgitation.  IMPRESSIONS:  1. No significant coronary artery disease. 2. Severely decreased left ventricular systolic function.  LVEDP 15 mmHg.  Ejection fraction 20-25%. 3.   Severe pulmonary artery hypertension.  Mitral regurgitation.  PA saturation 54%, aortic saturation 91%, cardiac output by Fick 4.4 L/m. Cardiac index 2.2.  RECOMMENDATION:  Continue aggressive medical therapy for her nonischemic cardiomyopathy.  Follow-up with Dr. Radford Pax.

## 2015-02-15 NOTE — Interval H&P Note (Signed)
Cath Lab Visit (complete for each Cath Lab visit)  Clinical Evaluation Leading to the Procedure:   ACS: No.  Non-ACS:    Anginal Classification: CCS III  Anti-ischemic medical therapy: Minimal Therapy (1 class of medications)  Non-Invasive Test Results: No non-invasive testing performed  Prior CABG: No previous CABG   Ischemic Symptoms? CCS III (Marked limitation of ordinary activity) Anti-ischemic Medical Therapy? Minimal Therapy (1 class of medications) Non-invasive Test Results? No non-invasive testing performed Prior CABG? No Previous CABG   Patient Information:   1-2V CAD, no prox LAD  A (7)  Indication: 20; Score: 7   Patient Information:   1-2V-CAD with DS 50-60% With No FFR, No IVUS  I (3)  Indication: 21; Score: 3   Patient Information:   1-2V-CAD with DS 50-60% With FFR  A (7)  Indication: 22; Score: 7   Patient Information:   1-2V-CAD with DS 50-60% With FFR>0.8, IVUS not significant  I (2)  Indication: 23; Score: 2   Patient Information:   3V-CAD without LMCA With Abnormal LV systolic function  A (9)  Indication: 48; Score: 9   Patient Information:   LMCA-CAD  A (9)  Indication: 49; Score: 9   Patient Information:   2V-CAD with prox LAD PCI  A (7)  Indication: 62; Score: 7   Patient Information:   2V-CAD with prox LAD CABG  A (8)  Indication: 62; Score: 8   Patient Information:   3V-CAD without LMCA With Low CAD burden(i.e., 3 focal stenoses, low SYNTAX score) PCI  A (7)  Indication: 63; Score: 7   Patient Information:   3V-CAD without LMCA With Low CAD burden(i.e., 3 focal stenoses, low SYNTAX score) CABG  A (9)  Indication: 63; Score: 9   Patient Information:   3V-CAD without LMCA E06c - Intermediate-high CAD burden (i.e., multiple diffuse lesions, presence of CTO, or high SYNTAX score) PCI  U (4)  Indication: 64; Score: 4   Patient Information:   3V-CAD without LMCA E06c -  Intermediate-high CAD burden (i.e., multiple diffuse lesions, presence of CTO, or high SYNTAX score) CABG  A (9)  Indication: 64; Score: 9   Patient Information:   LMCA-CAD With Isolated LMCA stenosis  PCI  U (6)  Indication: 65; Score: 6   Patient Information:   LMCA-CAD With Isolated LMCA stenosis  CABG  A (9)  Indication: 65; Score: 9   Patient Information:   LMCA-CAD Additional CAD, low CAD burden (i.e., 1- to 2-vessel additional involvement, low SYNTAX score) PCI  U (5)  Indication: 66; Score: 5   Patient Information:   LMCA-CAD Additional CAD, low CAD burden (i.e., 1- to 2-vessel additional involvement, low SYNTAX score) CABG  A (9)  Indication: 66; Score: 9   Patient Information:   LMCA-CAD Additional CAD, intermediate-high CAD burden (i.e., 3-vessel involvement, presence of CTO, or high SYNTAX score) PCI  I (3)  Indication: 67; Score: 3   Patient Information:   LMCA-CAD Additional CAD, intermediate-high CAD burden (i.e., 3-vessel involvement, presence of CTO, or high SYNTAX score) CABG  A (9)  Indication: 67; Score: 9    History and Physical Interval Note:  02/15/2015 10:10 AM  Vanessa [name]  has presented today for surgery, with the diagnosis of shortness of breath  The various methods of treatment have been discussed with the patient and family. After consideration of risks, benefits and other options for treatment, the patient has consented to  Procedure(s): LEFT AND RIGHT HEART CATHETERIZATION WITH  CORONARY ANGIOGRAM (N/A) as a surgical intervention .  The patient's history has been reviewed, patient examined, no change in status, stable for surgery.  I have reviewed the patient's chart and labs.  Questions were answered to the patient's satisfaction.     Johni Narine S.

## 2015-02-15 NOTE — Progress Notes (Signed)
Site area: right groin a 5 french arterial and a 7 french venous sheath was removed  Site Prior to Removal:  Level 0  Pressure Applied For 15 MINUTES    Minutes Beginning at 1100am  Manual:   Yes.    Patient Status During Pull:  stable  Post Pull Groin Site:  Level 0  Post Pull Instructions Given:  Yes.    Post Pull Pulses Present:  Yes.    Dressing Applied:  Yes.    Comments:  VS remain stable during sheath pull.  Pt denies any discomfort at this time at site.

## 2015-02-15 NOTE — Addendum Note (Signed)
Addended by: Harland German A on: 02/15/2015 06:38 PM   Modules accepted: Orders

## 2015-02-15 NOTE — Telephone Encounter (Signed)
Patient had cath today showing severe LV dysfunction with severe pulmonary HTN and 3+ MR with LVEDP of 25 and mean PCWP of 15mmHg.  Pulmonary HTN most likely secondary to CHF and MR.  MR most likely secondary to worsening LV dysfunction.  Please have patient increase Lasix to 40mg  BID and add aldactone 12.5mg  daily.  Please set up repeat echo in 4 weeks.

## 2015-02-15 NOTE — Discharge Instructions (Signed)

## 2015-02-15 NOTE — Telephone Encounter (Signed)
Please get a BMET in 1 week

## 2015-02-15 NOTE — Telephone Encounter (Signed)
Informed patient of results and verbal understanding expressed.  Instructed patient to INCREASE LASIX to 40 mg BID. Instructed patient to START ALDACTONE 12.5 mg daily.  BMET scheduled for next Friday. ECHO ordered for scheduling in 4 weeks. Patient agrees with treatment plan.

## 2015-02-18 LAB — POCT I-STAT 3, ART BLOOD GAS (G3+)
Acid-base deficit: 1 mmol/L (ref 0.0–2.0)
Bicarbonate: 24.3 mEq/L — ABNORMAL HIGH (ref 20.0–24.0)
O2 SAT: 91 %
PCO2 ART: 41.8 mmHg (ref 35.0–45.0)
PH ART: 7.372 (ref 7.350–7.450)
TCO2: 26 mmol/L (ref 0–100)
pO2, Arterial: 63 mmHg — ABNORMAL LOW (ref 80.0–100.0)

## 2015-02-19 LAB — POCT I-STAT 3, VENOUS BLOOD GAS (G3P V)
ACID-BASE DEFICIT: 1 mmol/L (ref 0.0–2.0)
Bicarbonate: 25.4 mEq/L — ABNORMAL HIGH (ref 20.0–24.0)
O2 SAT: 54 %
PCO2 VEN: 48.4 mmHg (ref 45.0–50.0)
TCO2: 27 mmol/L (ref 0–100)
pH, Ven: 7.327 — ABNORMAL HIGH (ref 7.250–7.300)
pO2, Ven: 31 mmHg (ref 30.0–45.0)

## 2015-02-22 ENCOUNTER — Other Ambulatory Visit (INDEPENDENT_AMBULATORY_CARE_PROVIDER_SITE_OTHER): Payer: No Typology Code available for payment source | Admitting: *Deleted

## 2015-02-22 DIAGNOSIS — I429 Cardiomyopathy, unspecified: Secondary | ICD-10-CM

## 2015-02-22 DIAGNOSIS — I5022 Chronic systolic (congestive) heart failure: Secondary | ICD-10-CM

## 2015-02-22 DIAGNOSIS — I428 Other cardiomyopathies: Secondary | ICD-10-CM

## 2015-02-22 LAB — BASIC METABOLIC PANEL
BUN: 26 mg/dL — ABNORMAL HIGH (ref 6–23)
CALCIUM: 9.1 mg/dL (ref 8.4–10.5)
CO2: 26 mEq/L (ref 19–32)
Chloride: 104 mEq/L (ref 96–112)
Creat: 0.94 mg/dL (ref 0.50–1.10)
GLUCOSE: 100 mg/dL — AB (ref 70–99)
POTASSIUM: 4.2 meq/L (ref 3.5–5.3)
SODIUM: 141 meq/L (ref 135–145)

## 2015-02-22 NOTE — Addendum Note (Signed)
Addended by: Eulis Foster on: 02/22/2015 08:04 AM   Modules accepted: Orders

## 2015-03-18 ENCOUNTER — Ambulatory Visit (HOSPITAL_COMMUNITY): Payer: No Typology Code available for payment source | Attending: Cardiovascular Disease | Admitting: Cardiology

## 2015-03-18 DIAGNOSIS — I429 Cardiomyopathy, unspecified: Secondary | ICD-10-CM | POA: Insufficient documentation

## 2015-03-18 DIAGNOSIS — I5022 Chronic systolic (congestive) heart failure: Secondary | ICD-10-CM | POA: Diagnosis not present

## 2015-03-18 DIAGNOSIS — I272 Pulmonary hypertension, unspecified: Secondary | ICD-10-CM

## 2015-03-18 DIAGNOSIS — I340 Nonrheumatic mitral (valve) insufficiency: Secondary | ICD-10-CM | POA: Diagnosis not present

## 2015-03-18 DIAGNOSIS — I428 Other cardiomyopathies: Secondary | ICD-10-CM

## 2015-03-18 DIAGNOSIS — I270 Primary pulmonary hypertension: Secondary | ICD-10-CM | POA: Diagnosis not present

## 2015-03-18 NOTE — Progress Notes (Signed)
Echo performed. 

## 2015-03-19 ENCOUNTER — Telehealth: Payer: Self-pay | Admitting: Cardiology

## 2015-03-19 DIAGNOSIS — I5022 Chronic systolic (congestive) heart failure: Secondary | ICD-10-CM

## 2015-03-19 DIAGNOSIS — I428 Other cardiomyopathies: Secondary | ICD-10-CM

## 2015-03-19 NOTE — Telephone Encounter (Signed)
Informed patient of results and verbal understanding expressed.  Informed patient [facility] will be calling her to schedule ECHO for two months from now. Patient agrees with treatment plan.

## 2015-03-19 NOTE — Addendum Note (Signed)
Addended by: Harland German A on: 03/19/2015 06:56 PM   Modules accepted: Orders

## 2015-03-19 NOTE — Telephone Encounter (Signed)
Please let patient know that echo showed moderate to severely reduced LVF EF 30-35% with moderate pulmonary HTN. LVF has improved some. She has only been on max med therapy for 4 weeks.  Please repeat limited echo in 2 months for LVF assessment.

## 2015-04-08 ENCOUNTER — Other Ambulatory Visit: Payer: Self-pay | Admitting: *Deleted

## 2015-04-08 MED ORDER — CARVEDILOL 25 MG PO TABS: 25.0000 mg | ORAL_TABLET | Freq: Two times a day (BID) | ORAL | Status: DC

## 2015-04-17 ENCOUNTER — Telehealth: Payer: Self-pay | Admitting: Cardiology

## 2015-04-17 NOTE — Telephone Encounter (Signed)
Left message to call back  

## 2015-04-17 NOTE — Telephone Encounter (Signed)
New Message   Pt wanted to clarify an appt. Has OV w/ Dr. Radford Pax for 5/23 and echo for June. Pt wanted to see if Dr. Radford Pax wanted the echo done first then the [place]. Please call back, preferably before noon (per pt), and discuss.

## 2015-04-18 NOTE — Telephone Encounter (Signed)
Left message for patient to keep appointments AS IS and to call if she has any further questions or concerns.

## 2015-04-22 ENCOUNTER — Ambulatory Visit: Payer: No Typology Code available for payment source | Admitting: Cardiology

## 2015-04-25 NOTE — Telephone Encounter (Signed)
BP and HR good on current medical regimen

## 2015-04-26 ENCOUNTER — Encounter: Payer: Self-pay | Admitting: Cardiology

## 2015-04-26 ENCOUNTER — Ambulatory Visit (INDEPENDENT_AMBULATORY_CARE_PROVIDER_SITE_OTHER): Payer: No Typology Code available for payment source | Admitting: Cardiology

## 2015-04-26 VITALS — BP 108/70 | HR 87 | Ht 62.0 in | Wt 210.4 lb

## 2015-04-26 DIAGNOSIS — I429 Cardiomyopathy, unspecified: Secondary | ICD-10-CM

## 2015-04-26 DIAGNOSIS — I493 Ventricular premature depolarization: Secondary | ICD-10-CM | POA: Diagnosis not present

## 2015-04-26 DIAGNOSIS — I10 Essential (primary) hypertension: Secondary | ICD-10-CM | POA: Diagnosis not present

## 2015-04-26 DIAGNOSIS — I5022 Chronic systolic (congestive) heart failure: Secondary | ICD-10-CM

## 2015-04-26 DIAGNOSIS — I340 Nonrheumatic mitral (valve) insufficiency: Secondary | ICD-10-CM | POA: Diagnosis not present

## 2015-04-26 DIAGNOSIS — I420 Dilated cardiomyopathy: Secondary | ICD-10-CM

## 2015-04-26 LAB — BASIC METABOLIC PANEL
BUN: 34 mg/dL — AB (ref 6–23)
CO2: 29 meq/L (ref 19–32)
Calcium: 9.2 mg/dL (ref 8.4–10.5)
Chloride: 103 mEq/L (ref 96–112)
Creatinine, Ser: 1.2 mg/dL (ref 0.40–1.20)
GFR: 58.22 mL/min — ABNORMAL LOW (ref >60.00)
Glucose, Bld: 112 mg/dL — ABNORMAL HIGH (ref 70–99)
POTASSIUM: 4.2 meq/L (ref 3.5–5.1)
Sodium: 137 mEq/L (ref 135–145)

## 2015-04-26 NOTE — Patient Instructions (Signed)

## 2015-04-26 NOTE — Progress Notes (Signed)
Cardiology Office Note   Date:  04/26/2015   ID:  [name], DOB 01/22/1951, MRN [id number]  PCP:  REDMON,NOELLE, PA-C    Chief Complaint  Patient presents with  . Follow-up    chronic systolic CHF      History of Present Illness:  Vanessa [name] is a 64 y.o. female with a history of nonischemic DCM EF 30-35% by echo and 20-25% by cath with normal coronary arteries by cath 02/15/2015 with 3+ MR, HTN, PVC's and moderate to severe MR by echo 07/2014 who presents today for followup. Her Lasix was increased and Aldactone added after her cath.  She is doing well. She denies any chest pain, LE edema, dizziness, palpitations or syncope.  She denies any LE edema but is on a diuretic. She recently went to [place] and did a lot of walking and had no problems with SOB.     Past Medical History  Diagnosis Date  . Hypertension   . Nonischemic cardiomyopathy     EF 30-35% by echo 2015  . Dysrhythmia     PVC's  . Moderate mitral regurgitation     moderate to severe MR with moderate pulmonary HTN  . PVC's (premature ventricular contractions)   . Pulmonary HTN     moderate with PASP 78mmHg by echo 2015    Past Surgical History  Procedure Laterality Date  . Hysteroscopy w/d&c  12/02/2012    Procedure: DILATATION AND CURETTAGE /HYSTEROSCOPY;  Surgeon: Maeola Sarah. Landry Mellow, MD;  Location: [facility];  Service: Gynecology;  Laterality: N/A;  cervical polypectomy  . Cardiac catheterization  2003    normal coronary arteries  . Cardiac catheterization  2013  Nov    h/o nonischemic DCM EF 35-40% increase LVEDP   . Left and right heart catheterization with coronary angiogram N/A 02/15/2015    Procedure: LEFT AND RIGHT HEART CATHETERIZATION WITH CORONARY ANGIOGRAM;  Surgeon: Jettie Booze, MD;  Location: [facility];  Service: Cardiovascular;  Laterality: N/A;     Current Outpatient Prescriptions  Medication Sig Dispense Refill  . aspirin 81 MG EC tablet Take 1 tablet (81 mg total) by  mouth daily.    Marland Kitchen CALCIUM-VITAMIN D PO Take 1 tablet by mouth daily.    . carvedilol (COREG) 25 MG tablet Take 1 tablet (25 mg total) by mouth 2 (two) times daily with a meal. 60 tablet 3  . furosemide (LASIX) 40 MG tablet Take 1 tablet (40 mg total) by mouth 2 (two) times daily. 60 tablet 11  . ramipril (ALTACE) 5 MG capsule Take 1 capsule (5 mg total) by mouth 2 (two) times daily. 60 capsule 11  . spironolactone (ALDACTONE) 25 MG tablet Take 0.5 tablets (12.5 mg total) by mouth daily. 15 tablet 6   No current facility-administered medications for this visit.    Allergies:   Review of patient's allergies indicates no known allergies.    Social History:  The patient  reports that she has never smoked. She has never used smokeless tobacco. She reports that she does not drink alcohol or use illicit drugs.   Family History:  The patient's family history includes Breast cancer in her sister; Heart disease in her father. There is no history of Colon cancer, Esophageal cancer, Rectal cancer, or Stomach cancer.    ROS:  Please see the history of present illness.   Otherwise, review of systems are positive for none.   All other systems are reviewed and negative.  PHYSICAL EXAM: VS:  BP 108/70 mmHg  Pulse 87  Ht 5\' 2"  (1.575 m)  Wt 210 lb 6.4 oz (95.437 kg)  BMI 38.47 kg/m2  SpO2 94% , BMI Body mass index is 38.47 kg/(m^2). GEN: Well nourished, well developed, in no acute distress HEENT: normal Neck: no JVD, carotid bruits, or masses Cardiac: RRR; no  rubs, or gallops,no edema.  2/6 holosystolic murmur at LLSB to apex Respiratory:  clear to auscultation bilaterally, normal work of breathing GI: soft, nontender, nondistended, + BS MS: no deformity or atrophy Skin: warm and dry, no rash Neuro:  Strength and sensation are intact Psych: euthymic mood, full affect   EKG:  EKG is not ordered today.    Recent Labs: 01/28/2015: Pro B Natriuretic peptide (BNP) 1696.0* 02/11/2015:  Hemoglobin 11.7*; Platelets 189.0 02/22/2015: BUN 26*; Creatinine 0.94; Potassium 4.2; Sodium 141    Lipid Panel    Component Value Date/Time   CHOL 193 11/13/2008 2027   TRIG 116 11/13/2008 2027   HDL 55 11/13/2008 2027   CHOLHDL 3.5 Ratio 11/13/2008 2027   VLDL 23 11/13/2008 2027   LDLCALC 115* 11/13/2008 2027      Wt Readings from Last 3 Encounters:  04/26/15 210 lb 6.4 oz (95.437 kg)  02/15/15 213 lb (96.616 kg)  01/28/15 213 lb 1.9 oz (96.671 kg)     ASSESSMENT AND PLAN:  1. Nonischemic DCM appears well compensated.  I will repeat her echo next month after being on max med therapy for 3 months to see if EF has improved. 2. HTN well controlled - continue ramipril/carvedilol/aldactone 3. PVC's asymptomatic on beta blocker. 4. Chronic systolic CHF - continue Lasix/ACE I/beta blocker/aldactone - check BMET  5. Moderate to severe MR by echo 07/2014 and 3+ by cath 01/2015 felt secondary to annular dilatation from LV dysfunction.  Heart murmur persists today.  Will reevaluate at echo next month.    Current medicines are reviewed at length with the patient today.  The patient does not have concerns regarding medicines.  The following changes have been made:  no change  Labs/ tests ordered today include: see above assessment and plan No orders of the defined types were placed in this encounter.     Disposition:   FU with me in 6 months   Signed, Sueanne Margarita, MD  04/26/2015 10:32 AM    [facility] [place], [place], [place]  [address] Phone: [phone]; Fax: [phone]

## 2015-04-30 ENCOUNTER — Telehealth: Payer: Self-pay

## 2015-04-30 DIAGNOSIS — I10 Essential (primary) hypertension: Secondary | ICD-10-CM

## 2015-04-30 MED ORDER — FUROSEMIDE 40 MG PO TABS: 40.0000 mg | ORAL_TABLET | Freq: Every day | ORAL | Status: DC

## 2015-04-30 NOTE — Telephone Encounter (Signed)
-----   Message from Sueanne Margarita, MD sent at 04/28/2015 12:22 PM EDT ----- CO2 increased - please decrease Lasix to 40mg  daily and recheck BMET in1 week

## 2015-04-30 NOTE — Telephone Encounter (Signed)
Instructed patient to DECREASE LASIX to 40 mg daily.  Repeat BMET scheduled for next Tuesday. Patient agrees with treatment plan.

## 2015-05-02 ENCOUNTER — Telehealth: Payer: Self-pay | Admitting: Cardiology

## 2015-05-02 NOTE — Telephone Encounter (Signed)
ERROR

## 2015-05-07 ENCOUNTER — Other Ambulatory Visit: Payer: No Typology Code available for payment source

## 2015-05-08 ENCOUNTER — Other Ambulatory Visit (INDEPENDENT_AMBULATORY_CARE_PROVIDER_SITE_OTHER): Payer: No Typology Code available for payment source | Admitting: *Deleted

## 2015-05-08 DIAGNOSIS — I10 Essential (primary) hypertension: Secondary | ICD-10-CM | POA: Diagnosis not present

## 2015-05-08 LAB — BASIC METABOLIC PANEL
BUN: 23 mg/dL (ref 6–23)
CO2: 29 mEq/L (ref 19–32)
CREATININE: 0.99 mg/dL (ref 0.40–1.20)
Calcium: 9.1 mg/dL (ref 8.4–10.5)
Chloride: 105 mEq/L (ref 96–112)
GFR: 72.68 mL/min (ref >60.00)
Glucose, Bld: 92 mg/dL (ref 70–99)
POTASSIUM: 4.4 meq/L (ref 3.5–5.1)
Sodium: 137 mEq/L (ref 135–145)

## 2015-05-20 ENCOUNTER — Ambulatory Visit (HOSPITAL_COMMUNITY): Payer: No Typology Code available for payment source | Attending: Cardiology

## 2015-05-20 ENCOUNTER — Other Ambulatory Visit: Payer: Self-pay

## 2015-05-20 DIAGNOSIS — I429 Cardiomyopathy, unspecified: Secondary | ICD-10-CM

## 2015-05-20 DIAGNOSIS — I5022 Chronic systolic (congestive) heart failure: Secondary | ICD-10-CM | POA: Diagnosis not present

## 2015-05-20 DIAGNOSIS — I509 Heart failure, unspecified: Secondary | ICD-10-CM | POA: Insufficient documentation

## 2015-05-20 DIAGNOSIS — I428 Other cardiomyopathies: Secondary | ICD-10-CM

## 2015-05-21 ENCOUNTER — Encounter: Payer: Self-pay | Admitting: Cardiology

## 2015-05-21 NOTE — Telephone Encounter (Signed)
This encounter was created in error - please disregard.

## 2015-05-21 NOTE — Telephone Encounter (Signed)
New message     Patient calling back stating the nurse called her today.

## 2015-05-23 ENCOUNTER — Telehealth: Payer: Self-pay

## 2015-05-23 DIAGNOSIS — I272 Pulmonary hypertension, unspecified: Secondary | ICD-10-CM

## 2015-05-23 DIAGNOSIS — I5022 Chronic systolic (congestive) heart failure: Secondary | ICD-10-CM

## 2015-05-23 NOTE — Telephone Encounter (Signed)
-----   Message from Sueanne Margarita, MD sent at 05/20/2015  3:35 PM EDT ----- Please let patient know that echo showed mildly reduced LVF with EF 40%, moderately dilated LV, moderately leaky MV and TV, moderate pulmonary HTN.  No indication for ICD with improved EF.  Please get BNP.  Pulmonary HTN most likely secondary to MR and diastolic dysfunction with pulmonary venous hypertension but will refer to Dr. Aundra Dubin for further evaluation with possible right heart cath.  Will also check chest CT angio to rule out chronic PE and PFTs to evaluate for underlying pulmonary disease.  If normal then consider sleep study.

## 2015-05-23 NOTE — Telephone Encounter (Signed)
Patient agrees to blood work, Dr. Aundra Dubin referral, chest CTA, and PFTs. All ordered placed for scheduling.

## 2015-05-24 ENCOUNTER — Encounter: Payer: Self-pay | Admitting: Cardiology

## 2015-05-27 ENCOUNTER — Other Ambulatory Visit (INDEPENDENT_AMBULATORY_CARE_PROVIDER_SITE_OTHER): Payer: No Typology Code available for payment source | Admitting: *Deleted

## 2015-05-27 DIAGNOSIS — I5022 Chronic systolic (congestive) heart failure: Secondary | ICD-10-CM

## 2015-05-27 LAB — BRAIN NATRIURETIC PEPTIDE: Pro B Natriuretic peptide (BNP): 519 pg/mL — ABNORMAL HIGH (ref 0.0–100.0)

## 2015-05-30 ENCOUNTER — Telehealth: Payer: Self-pay | Admitting: *Deleted

## 2015-05-30 DIAGNOSIS — R7989 Other specified abnormal findings of blood chemistry: Secondary | ICD-10-CM

## 2015-05-30 DIAGNOSIS — I10 Essential (primary) hypertension: Secondary | ICD-10-CM

## 2015-05-30 NOTE — Telephone Encounter (Signed)
-----   Message from Sueanne Margarita, MD sent at 05/28/2015 10:46 PM EDT ----- Increase Lasix to 40mg  BID and recheck BNP and BMET in 1 week

## 2015-05-30 NOTE — Telephone Encounter (Signed)
Spoke with pt and informed her to start Lasix 40mg  BID and check BMET and BNP in one week. Appt made for 7/7. Pt verbalized understanding and was in agreement with this plan.

## 2015-06-04 ENCOUNTER — Ambulatory Visit (HOSPITAL_COMMUNITY)
Admission: RE | Admit: 2015-06-04 | Discharge: 2015-06-04 | Disposition: A | Discharge status: Home / Self Care | Payer: No Typology Code available for payment source | Source: Ambulatory Visit | Attending: Cardiology | Admitting: Cardiology

## 2015-06-04 DIAGNOSIS — I272 Other secondary pulmonary hypertension: Secondary | ICD-10-CM | POA: Diagnosis present

## 2015-06-04 DIAGNOSIS — J9811 Atelectasis: Secondary | ICD-10-CM | POA: Diagnosis not present

## 2015-06-04 DIAGNOSIS — I517 Cardiomegaly: Secondary | ICD-10-CM | POA: Diagnosis not present

## 2015-06-04 DIAGNOSIS — E041 Nontoxic single thyroid nodule: Secondary | ICD-10-CM | POA: Diagnosis not present

## 2015-06-04 LAB — PULMONARY FUNCTION TEST
DL/VA % pred: 100 %
DL/VA: 4.42 ml/min/mmHg/L
DLCO UNC % PRED: 52 %
DLCO unc: 10.53 ml/min/mmHg
FEF 25-75 Post: 0.9 L/sec
FEF 25-75 Pre: 1.01 L/sec
FEF2575-%Change-Post: -11 %
FEF2575-%PRED-POST: 52 %
FEF2575-%PRED-PRE: 58 %
FEV1-%Change-Post: -2 %
FEV1-%Pred-Post: 66 %
FEV1-%Pred-Pre: 68 %
FEV1-POST: 1.15 L
FEV1-PRE: 1.18 L
FEV1FVC-%Change-Post: 5 %
FEV1FVC-%Pred-Pre: 99 %
FEV6-%Change-Post: -8 %
FEV6-%Pred-Post: 64 %
FEV6-%Pred-Pre: 70 %
FEV6-POST: 1.38 L
FEV6-Pre: 1.5 L
FEV6FVC-%CHANGE-POST: 0 %
FEV6FVC-%Pred-Post: 104 %
FEV6FVC-%Pred-Pre: 103 %
FVC-%CHANGE-POST: -7 %
FVC-%PRED-PRE: 67 %
FVC-%Pred-Post: 62 %
FVC-Post: 1.39 L
FVC-Pre: 1.51 L
PRE FEV6/FVC RATIO: 100 %
Post FEV1/FVC ratio: 82 %
Post FEV6/FVC ratio: 100 %
Pre FEV1/FVC ratio: 78 %
RV % pred: 82 %
RV: 1.58 L
TLC % pred: 70 %
TLC: 3.23 L

## 2015-06-04 MED ORDER — ALBUTEROL SULFATE (2.5 MG/3ML) 0.083% IN NEBU
2.5000 mg | INHALATION_SOLUTION | Freq: Once | RESPIRATORY_TRACT | Status: AC
  Administered 2015-06-04: 2.5 mg via RESPIRATORY_TRACT

## 2015-06-04 MED ORDER — IOHEXOL 350 MG/ML SOLN
100.0000 mL | Freq: Once | INTRAVENOUS | Status: AC | PRN
  Administered 2015-06-04: 80 mL via INTRAVENOUS

## 2015-06-06 ENCOUNTER — Other Ambulatory Visit (INDEPENDENT_AMBULATORY_CARE_PROVIDER_SITE_OTHER): Payer: No Typology Code available for payment source | Admitting: *Deleted

## 2015-06-06 DIAGNOSIS — I10 Essential (primary) hypertension: Secondary | ICD-10-CM | POA: Diagnosis not present

## 2015-06-06 DIAGNOSIS — R7989 Other specified abnormal findings of blood chemistry: Secondary | ICD-10-CM

## 2015-06-06 DIAGNOSIS — R799 Abnormal finding of blood chemistry, unspecified: Secondary | ICD-10-CM

## 2015-06-06 LAB — BASIC METABOLIC PANEL
BUN: 35 mg/dL — AB (ref 6–23)
CHLORIDE: 105 meq/L (ref 96–112)
CO2: 22 mEq/L (ref 19–32)
Calcium: 8.9 mg/dL (ref 8.4–10.5)
Creatinine, Ser: 1.12 mg/dL (ref 0.40–1.20)
GFR: 63.02 mL/min (ref >60.00)
GLUCOSE: 102 mg/dL — AB (ref 70–99)
POTASSIUM: 4.5 meq/L (ref 3.5–5.1)
SODIUM: 135 meq/L (ref 135–145)

## 2015-06-06 LAB — BRAIN NATRIURETIC PEPTIDE: Pro B Natriuretic peptide (BNP): 596 pg/mL — ABNORMAL HIGH (ref 0.0–100.0)

## 2015-06-11 ENCOUNTER — Telehealth: Payer: Self-pay

## 2015-06-11 DIAGNOSIS — R942 Abnormal results of pulmonary function studies: Secondary | ICD-10-CM

## 2015-06-11 NOTE — Telephone Encounter (Signed)
Informed patient of results and verbal understanding expressed.    Pulmonary referral placed for scheduling.

## 2015-06-11 NOTE — Telephone Encounter (Signed)
-----   Message from Sueanne Margarita, MD sent at 06/09/2015  6:44 PM EDT ----- Reduced DLCO with pulmonary HTN on echo - please set up app with Dr. Tacey Ruiz or Halford Chessman

## 2015-06-12 ENCOUNTER — Encounter: Payer: Self-pay | Admitting: Cardiology

## 2015-06-12 ENCOUNTER — Ambulatory Visit (HOSPITAL_COMMUNITY)
Admission: RE | Admit: 2015-06-12 | Discharge: 2015-06-12 | Disposition: A | Discharge status: Home / Self Care | Payer: No Typology Code available for payment source | Source: Ambulatory Visit | Attending: Cardiology | Admitting: Cardiology

## 2015-06-12 ENCOUNTER — Encounter (HOSPITAL_COMMUNITY): Payer: Self-pay

## 2015-06-12 ENCOUNTER — Other Ambulatory Visit: Payer: Self-pay | Admitting: Physician Assistant

## 2015-06-12 VITALS — BP 98/64 | HR 85 | Ht 62.0 in | Wt 213.8 lb

## 2015-06-12 DIAGNOSIS — Z79899 Other long term (current) drug therapy: Secondary | ICD-10-CM | POA: Diagnosis not present

## 2015-06-12 DIAGNOSIS — I5022 Chronic systolic (congestive) heart failure: Secondary | ICD-10-CM | POA: Diagnosis not present

## 2015-06-12 DIAGNOSIS — I340 Nonrheumatic mitral (valve) insufficiency: Secondary | ICD-10-CM | POA: Diagnosis not present

## 2015-06-12 DIAGNOSIS — I270 Primary pulmonary hypertension: Secondary | ICD-10-CM | POA: Insufficient documentation

## 2015-06-12 DIAGNOSIS — I493 Ventricular premature depolarization: Secondary | ICD-10-CM

## 2015-06-12 DIAGNOSIS — Z8249 Family history of ischemic heart disease and other diseases of the circulatory system: Secondary | ICD-10-CM | POA: Diagnosis not present

## 2015-06-12 DIAGNOSIS — Z7982 Long term (current) use of aspirin: Secondary | ICD-10-CM | POA: Insufficient documentation

## 2015-06-12 DIAGNOSIS — E079 Disorder of thyroid, unspecified: Secondary | ICD-10-CM

## 2015-06-12 DIAGNOSIS — I429 Cardiomyopathy, unspecified: Secondary | ICD-10-CM | POA: Insufficient documentation

## 2015-06-12 MED ORDER — FUROSEMIDE 40 MG PO TABS: 60.0000 mg | ORAL_TABLET | Freq: Two times a day (BID) | ORAL | Status: DC

## 2015-06-12 MED ORDER — SPIRONOLACTONE 25 MG PO TABS: 25.0000 mg | ORAL_TABLET | Freq: Every day | ORAL | Status: DC

## 2015-06-12 NOTE — Progress Notes (Signed)
Patient ID: Vanessa [name], female   DOB: 1950-12-10, 64 y.o.   MRN: [id number] PCP: Lennie Odor Primary Cardiologist: Dr. Radford Pax  64 yo with history of nonischemic cardiomyopathy presents for CHF clinic evaluation. Patient has had a low EF recognized since at least 9/14, when echo showed EF 40-45%.  In June 27, 2023, her child died, which led to significant stress.  She developed worsening exertional dyspnea and subsequent echo showed a fall in EF.  She had left and right heart catheterization in 3/16 with no significant coronary disease detected and moderately elevated right and left heart filling pressures along with pulmonary venous hypertension.  Most recent echo in 6/16 appeared to show some improvement with EF up to 40%.    Currently, she is stable.  She is short of breath walking fast or going up more than 1 flight of steps.  No orthopnea or PND.  No chest.  Since starting Coreg, she does not feel palpitations.  Back in 10/15, however, she had a holter monitor showing 12% PVCs.  She takes all her meds.  Weight has been stable. BP is low today, but she denies lightheadedness.  SBP typically runs in the 120s.   ECG: NSR, frequent PVCs, diffuse T wave flattening, narrow QRS  Labs (7/16): K 4.5, creatinine 1.12, BNP 596  PMH: 1. Cardiomyopathy: Nonischemic.  Echo (9/14) with EF 40-45%.  Echo (2/16) with EF 25-30%.  Echo (4/16) with EF 30-35%, moderate MR.  Echo (6/16) with EF 40%, moderate LV dilation, moderate diastolic dysfunction, moderate MR, moderate TR, PA systolic pressure 52 mmHg. RHC/LHC (3/16) with no significant CAD, EF 20-25%, 3+ MR; mean RA 13, PA 70/30 mean 45, mean PCWP 31 mmHg, CI 2.2, PVR 3.1 WU.  2. PVCs: Holter (10/15) with 12% PVCs. 3. Pulmonary venous hypertension: CTA chest (7/16) negative for PE. PFTs (7/16) with minimal obstruction, mild restriction, moderately decreased DLCO.   SH: Lives alone, only child passed away in 06/27/2023, nonsmoker, no ETOH or drugs.   FH: Father with  MIs  ROS: All systems reviewed and negative except as per HPI.   Current Outpatient Prescriptions  Medication Sig Dispense Refill  . aspirin 81 MG EC tablet Take 1 tablet (81 mg total) by mouth daily.    Marland Kitchen CALCIUM-VITAMIN D PO Take 1 tablet by mouth daily.    . carvedilol (COREG) 25 MG tablet Take 1 tablet (25 mg total) by mouth 2 (two) times daily with a meal. 60 tablet 3  . furosemide (LASIX) 40 MG tablet Take 1.5 tablets (60 mg total) by mouth 2 (two) times daily. 90 tablet 6  . ramipril (ALTACE) 5 MG capsule Take 1 capsule (5 mg total) by mouth 2 (two) times daily. 60 capsule 11  . spironolactone (ALDACTONE) 25 MG tablet Take 1 tablet (25 mg total) by mouth daily. 30 tablet 6   No current facility-administered medications for this encounter.   BP 98/64 mmHg  Pulse 85  Ht 5\' 2"  (1.575 m)  Wt 213 lb 12.8 oz (96.979 kg)  BMI 39.09 kg/m2  SpO2 97% General: NAD Neck: JVP 8-9 cm, no thyromegaly or thyroid nodule.  Lungs: Clear to auscultation bilaterally with normal respiratory effort. CV: Nondisplaced PMI.  Heart regular S1/S2, no S3/S4, 2/6 HSM LLSB/apex.  1+ ankle edema.  No carotid bruit.  Normal pedal pulses.  Abdomen: Soft, nontender, no hepatosplenomegaly, no distention.  Skin: Intact without lesions or rashes.  Neurologic: Alert and oriented x 3.  Psych: Normal affect. Extremities: No clubbing or  cyanosis.  HEENT: Normal.   Assessment/Plan: 1. Chronic systolic CHF: Nonischemic cardiomyopathy.  EF 40% on last echo in 6/16.  She had frequent PVCs on 10/15 holter (12% total), but probably not enough to explain her cardiomyopathy.  Also, she has had significant improvement in palpitations since she has gone on Coreg so suspect less PVCs, but EF remains depressed. Cannot rule out myocarditis.  Takotsubo cardiomyopathy in setting of child's death would be a consideration, but EF has not fully recovered and low EF actually preceeded the stressful event.  She has NYHA class II-III  symptoms and has some volume overload on exam.  - We discussed cardiac MRI to look for infiltrative disease, etc. She does not think she could tolerate the MRI tube. - Increase Lasix to 60 mg bid.  - Increase spironolactone to 25 mg daily.  - Check BMET/BNP in 10 days.  - Continue current Coreg and ramipril. Ideally, would eventually start Bidil but need to monitor BP further.  - Follow < 2g Na diet.  - Repeat echo in 12/15.   2. PVCs: Frequent (12%) on 10/15 holter.  Probably not enough to explain cardiomyopathy.  Suspect less now as palpitations have mostly resolved on Coreg. I recommended a followup holter, but she does not want to wear a holter again.  3. Pulmonary hypertension: Patient has primarily pulmonary venous hypertension (PVR 3.1) from elevated left atrial pressure (see RHC data above).  Treatment for this will be adequate diuresis, not selective pulmonary vasodilators.   4. Mitral regurgitation: Moderate functional MR on last echo.   [name] 06/12/2015

## 2015-06-12 NOTE — Patient Instructions (Signed)
Increase Spironolactone 25 mg (1 tab) daily  Increase Furosemide (Lasix) to 60 mg (1 & 1/2 tabs) Twice daily   Labs in about 10 days  Your physician recommends that you schedule a follow-up appointment in: 1 month

## 2015-06-18 ENCOUNTER — Other Ambulatory Visit: Payer: No Typology Code available for payment source

## 2015-07-03 ENCOUNTER — Ambulatory Visit (HOSPITAL_COMMUNITY)
Admission: RE | Admit: 2015-07-03 | Discharge: 2015-07-03 | Disposition: A | Discharge status: Home / Self Care | Payer: No Typology Code available for payment source | Source: Ambulatory Visit | Attending: Internal Medicine | Admitting: Internal Medicine

## 2015-07-03 DIAGNOSIS — I5022 Chronic systolic (congestive) heart failure: Secondary | ICD-10-CM | POA: Insufficient documentation

## 2015-07-03 LAB — BASIC METABOLIC PANEL
ANION GAP: 11 (ref 5–15)
BUN: 44 mg/dL — AB (ref 6–20)
CO2: 25 mmol/L (ref 22–32)
CREATININE: 1.29 mg/dL — AB (ref 0.44–1.00)
Calcium: 9.5 mg/dL (ref 8.9–10.3)
Chloride: 105 mmol/L (ref 101–111)
GFR calc Af Amer: 50 mL/min — ABNORMAL LOW (ref >60)
GFR calc non Af Amer: 43 mL/min — ABNORMAL LOW (ref >60)
Glucose, Bld: 117 mg/dL — ABNORMAL HIGH (ref 65–99)
Potassium: 4 mmol/L (ref 3.5–5.1)
SODIUM: 141 mmol/L (ref 135–145)

## 2015-07-03 LAB — BRAIN NATRIURETIC PEPTIDE: B Natriuretic Peptide: 397.9 pg/mL — ABNORMAL HIGH (ref 0.0–100.0)

## 2015-07-04 ENCOUNTER — Other Ambulatory Visit: Payer: Self-pay | Admitting: Physician Assistant

## 2015-07-04 ENCOUNTER — Other Ambulatory Visit (HOSPITAL_COMMUNITY)
Admission: RE | Admit: 2015-07-04 | Discharge: 2015-07-04 | Disposition: A | Discharge status: Home / Self Care | Payer: No Typology Code available for payment source | Source: Ambulatory Visit | Attending: Family Medicine | Admitting: Family Medicine

## 2015-07-04 ENCOUNTER — Ambulatory Visit
Admission: RE | Admit: 2015-07-04 | Discharge: 2015-07-04 | Disposition: A | Discharge status: Home / Self Care | Payer: No Typology Code available for payment source | Source: Ambulatory Visit | Attending: Physician Assistant | Admitting: Physician Assistant

## 2015-07-04 DIAGNOSIS — Z124 Encounter for screening for malignant neoplasm of cervix: Secondary | ICD-10-CM | POA: Diagnosis not present

## 2015-07-04 DIAGNOSIS — E079 Disorder of thyroid, unspecified: Secondary | ICD-10-CM

## 2015-07-05 LAB — CYTOLOGY - PAP

## 2015-07-11 ENCOUNTER — Other Ambulatory Visit: Payer: Self-pay | Admitting: Physician Assistant

## 2015-07-11 DIAGNOSIS — E042 Nontoxic multinodular goiter: Secondary | ICD-10-CM

## 2015-07-15 ENCOUNTER — Telehealth (HOSPITAL_COMMUNITY): Payer: Self-pay | Admitting: Cardiology

## 2015-07-15 ENCOUNTER — Other Ambulatory Visit (HOSPITAL_COMMUNITY): Payer: Self-pay | Admitting: *Deleted

## 2015-07-15 ENCOUNTER — Encounter (HOSPITAL_COMMUNITY): Payer: Self-pay

## 2015-07-15 ENCOUNTER — Ambulatory Visit (HOSPITAL_COMMUNITY)
Admission: RE | Admit: 2015-07-15 | Discharge: 2015-07-15 | Disposition: A | Discharge status: Home / Self Care | Payer: No Typology Code available for payment source | Source: Ambulatory Visit | Attending: Cardiology | Admitting: Cardiology

## 2015-07-15 VITALS — BP 104/64 | HR 75 | Resp 18 | Wt 217.8 lb

## 2015-07-15 DIAGNOSIS — Z7982 Long term (current) use of aspirin: Secondary | ICD-10-CM | POA: Insufficient documentation

## 2015-07-15 DIAGNOSIS — I5022 Chronic systolic (congestive) heart failure: Secondary | ICD-10-CM | POA: Insufficient documentation

## 2015-07-15 DIAGNOSIS — I429 Cardiomyopathy, unspecified: Secondary | ICD-10-CM | POA: Insufficient documentation

## 2015-07-15 DIAGNOSIS — Z79899 Other long term (current) drug therapy: Secondary | ICD-10-CM | POA: Insufficient documentation

## 2015-07-15 DIAGNOSIS — Z8249 Family history of ischemic heart disease and other diseases of the circulatory system: Secondary | ICD-10-CM | POA: Diagnosis not present

## 2015-07-15 DIAGNOSIS — I340 Nonrheumatic mitral (valve) insufficiency: Secondary | ICD-10-CM | POA: Insufficient documentation

## 2015-07-15 DIAGNOSIS — I493 Ventricular premature depolarization: Secondary | ICD-10-CM | POA: Diagnosis not present

## 2015-07-15 DIAGNOSIS — I272 Other secondary pulmonary hypertension: Secondary | ICD-10-CM | POA: Diagnosis not present

## 2015-07-15 LAB — BASIC METABOLIC PANEL
Anion gap: 7 (ref 5–15)
BUN: 36 mg/dL — ABNORMAL HIGH (ref 6–20)
CO2: 27 mmol/L (ref 22–32)
Calcium: 8.9 mg/dL (ref 8.9–10.3)
Chloride: 105 mmol/L (ref 101–111)
Creatinine, Ser: 1.5 mg/dL — ABNORMAL HIGH (ref 0.44–1.00)
GFR calc non Af Amer: 36 mL/min — ABNORMAL LOW (ref >60)
GFR, EST AFRICAN AMERICAN: 42 mL/min — AB (ref >60)
GLUCOSE: 109 mg/dL — AB (ref 65–99)
POTASSIUM: 5.1 mmol/L (ref 3.5–5.1)
Sodium: 139 mmol/L (ref 135–145)

## 2015-07-15 LAB — BRAIN NATRIURETIC PEPTIDE: B Natriuretic Peptide: 426.3 pg/mL — ABNORMAL HIGH (ref 0.0–100.0)

## 2015-07-15 MED ORDER — FUROSEMIDE 40 MG PO TABS: ORAL_TABLET | ORAL | Status: DC

## 2015-07-15 NOTE — Telephone Encounter (Signed)
-----   Message from Larey Dresser, MD sent at 07/15/2015  2:18 PM EDT ----- Creatinine higher, let's have her decrease Lasix back to 40 mg bid for now.  If weight goes up more than 3 lbs over a week's time may take Lasix 60 mg bid x 3 days then back to 40 mg bid.

## 2015-07-15 NOTE — Progress Notes (Signed)
Patient ID: Vanessa [name], female   DOB: February 10, 1951, 64 y.o.   MRN: [id number] PCP: Lennie Odor Primary Cardiologist: Dr. Radford Pax  64 yo with history of nonischemic cardiomyopathy presents for CHF clinic evaluation. Patient has had a low EF recognized since at least 9/14, when echo showed EF 40-45%.  In 06-16-23, her child died, which led to significant stress.  She developed worsening exertional dyspnea and subsequent echo showed a fall in EF.  She had left and right heart catheterization in 3/16 with no significant coronary disease detected and moderately elevated right and left heart filling pressures along with pulmonary venous hypertension.  Most recent echo in 6/16 appeared to show some improvement with EF up to 40%.    Currently, she is stable.  She is short of breath walking fast or going up more than 1 flight of steps. She can clean her house without problems.  No orthopnea or PND.  No chest pain.  Since starting Coreg, she does not feel palpitations.  Back in 10/15, however, she had a holter monitor showing 12% PVCs.  At last appointment, I had her increase her Lasix dose.  She does not have any peripheral edema and thinks that she is breathing better.  BP 104/64 today.  She denies orthostatic symptoms or syncope.    Labs (7/16): K 4.5, creatinine 1.12, BNP 596 Labs (8/16): K 4, creatinine 1.29, BNP 388  PMH: 1. Cardiomyopathy: Nonischemic.  Echo (9/14) with EF 40-45%.  Echo (2/16) with EF 25-30%.  Echo (4/16) with EF 30-35%, moderate MR.  Echo (6/16) with EF 40%, moderate LV dilation, moderate diastolic dysfunction, moderate MR, moderate TR, PA systolic pressure 52 mmHg. RHC/LHC (3/16) with no significant CAD, EF 20-25%, 3+ MR; mean RA 13, PA 70/30 mean 45, mean PCWP 31 mmHg, CI 2.2, PVR 3.1 WU.  2. PVCs: Holter (10/15) with 12% PVCs. 3. Pulmonary venous hypertension: CTA chest (7/16) negative for PE. PFTs (7/16) with minimal obstruction, mild restriction, moderately decreased DLCO.  4.  Thyroid nodules  SH: Lives alone, only child passed away in 2023/06/16, nonsmoker, no ETOH or drugs.   FH: Father with MIs  ROS: All systems reviewed and negative except as per HPI.   Current Outpatient Prescriptions  Medication Sig Dispense Refill  . aspirin 81 MG EC tablet Take 1 tablet (81 mg total) by mouth daily.    Marland Kitchen CALCIUM-VITAMIN D PO Take 1 tablet by mouth daily.    . carvedilol (COREG) 25 MG tablet Take 1 tablet (25 mg total) by mouth 2 (two) times daily with a meal. 60 tablet 3  . ramipril (ALTACE) 5 MG capsule Take 1 capsule (5 mg total) by mouth 2 (two) times daily. 60 capsule 11  . spironolactone (ALDACTONE) 25 MG tablet Take 1 tablet (25 mg total) by mouth daily. 30 tablet 6  . furosemide (LASIX) 40 MG tablet Take 40 mg BID, unless weight increases 3lbs within a weeks time then increase to 60 mg BID x 3 days then back to 40 mg BID 90 tablet 6   No current facility-administered medications for this encounter.   BP 104/64 mmHg  Pulse 75  Resp 18  Wt 217 lb 12 oz (98.771 kg)  SpO2 97% General: NAD Neck: JVP 7-8 cm, no thyromegaly or thyroid nodule.  Lungs: Clear to auscultation bilaterally with normal respiratory effort. CV: Nondisplaced PMI.  Heart regular S1/S2, no S3/S4, 2/6 HSM LLSB/apex.  Trace ankle edema.  No carotid bruit.  Normal pedal pulses.  Abdomen:  Soft, nontender, no hepatosplenomegaly, no distention.  Skin: Intact without lesions or rashes.  Neurologic: Alert and oriented x 3.  Psych: Normal affect. Extremities: No clubbing or cyanosis.  HEENT: Normal.   Assessment/Plan: 1. Chronic systolic CHF: Nonischemic cardiomyopathy.  EF 40% on last echo in 6/16.  She had frequent PVCs on 10/15 holter (12% total), but probably not enough to explain her cardiomyopathy.  Also, she has had significant improvement in palpitations since she has gone on Coreg so suspect less PVCs, but EF remains depressed. Cannot rule out myocarditis.  Takotsubo cardiomyopathy in setting of  child's death would be a consideration, but EF has not fully recovered and low EF actually preceeded the stressful event.  She has NYHA class II symptoms and is not volume overloaded on exam.  - We discussed cardiac MRI to look for infiltrative disease, etc. She does not think she could tolerate the MRI tube. - Continue current Lasix at 60 mg bid and spironolactone 25 mg daily.  BMET/BNP today.   - Continue current Coreg and ramipril. Ideally, would eventually start Bidil but BP remains a bit soft.  - Follow < 2g Na diet.  - Repeat echo in 12/16.   2. PVCs: Frequent (12%) on 10/15 holter.  Probably not enough to explain cardiomyopathy.  Suspect less now as palpitations have mostly resolved on Coreg. I recommended a followup holter, but she does not want to wear a holter again.  3. Pulmonary hypertension: Patient has primarily pulmonary venous hypertension (PVR 3.1) from elevated left atrial pressure (see RHC data above).  Treatment for this will be adequate diuresis, not selective pulmonary vasodilators.   4. Mitral regurgitation: Moderate functional MR on last echo.   Followup in 6 wks.   [name] 8/64/2016

## 2015-07-15 NOTE — Patient Instructions (Signed)
FOLLOW UP in 6 weeks.  LABS today (bmet bnp)

## 2015-07-25 ENCOUNTER — Ambulatory Visit
Admission: RE | Admit: 2015-07-25 | Discharge: 2015-07-25 | Disposition: A | Discharge status: Home / Self Care | Payer: No Typology Code available for payment source | Source: Ambulatory Visit | Attending: Physician Assistant | Admitting: Physician Assistant

## 2015-07-25 ENCOUNTER — Other Ambulatory Visit (HOSPITAL_COMMUNITY)
Admission: RE | Admit: 2015-07-25 | Discharge: 2015-07-25 | Disposition: A | Discharge status: Home / Self Care | Payer: No Typology Code available for payment source | Source: Ambulatory Visit | Attending: Interventional Radiology | Admitting: Interventional Radiology

## 2015-07-25 DIAGNOSIS — E041 Nontoxic single thyroid nodule: Secondary | ICD-10-CM | POA: Diagnosis not present

## 2015-07-25 DIAGNOSIS — E042 Nontoxic multinodular goiter: Secondary | ICD-10-CM

## 2015-08-01 ENCOUNTER — Encounter: Payer: Self-pay | Admitting: Pulmonary Disease

## 2015-08-01 ENCOUNTER — Ambulatory Visit (INDEPENDENT_AMBULATORY_CARE_PROVIDER_SITE_OTHER): Payer: No Typology Code available for payment source | Admitting: Pulmonary Disease

## 2015-08-01 VITALS — BP 100/64 | HR 60 | Temp 98.2°F | Ht 62.0 in | Wt 215.0 lb

## 2015-08-01 DIAGNOSIS — R942 Abnormal results of pulmonary function studies: Secondary | ICD-10-CM

## 2015-08-01 DIAGNOSIS — I340 Nonrheumatic mitral (valve) insufficiency: Secondary | ICD-10-CM | POA: Diagnosis not present

## 2015-08-01 DIAGNOSIS — I270 Primary pulmonary hypertension: Secondary | ICD-10-CM

## 2015-08-01 DIAGNOSIS — I272 Pulmonary hypertension, unspecified: Secondary | ICD-10-CM

## 2015-08-01 NOTE — Progress Notes (Signed)
Chief Complaint  Patient presents with  . Pulmonary Consult    Referred by Dr. Fransico Him for abnormal PFT.  Has SOB with fluid retention.  Nonprod cough at times.      History of Present Illness: Vanessa [name] is a 64 y.o. female for evaluation of abnormal PFT.  She has been followed by cardiology for combined heart failure and mitral valve disease.  She was also found to have pulmonary hypertension.  She had PFT which showed mild restriction and moderate diffusion defect.  As a result she was advised to have pulmonary assessment.  She denies hx of smoking.  There is no hx of asthma, pneumonia, or exposure to TB.  She denies hx of arthritis, joint swelling, or skin rashes.  She has not had any animal exposure or travel history.  She is from [place].  She worked in [employer] fund[employer], but has since retired.  There is no hx of thromboembolic disease.  She has trouble with her breathing when she gets swelling.  When she takes lasix her breathing gets better.  She is not recently having cough, wheeze, sputum, chest pains, palpitations, or hemoptysis.  She does not feel like her breathing limits her activity at present.    She is not aware of snoring or breathing troubles while asleep.  She does not feel her sleep is an issue.   Tests: Echo 05/20/15 >> EF [id number], grade 2 diastolic dysfx, mod MR, PAS 52 mmHg CT chest 06/04/15 >> CM with ATX PFT 06/04/15 >> FEV1 1.18 (68%), FEV1% 78, TLC 3.23 (70%), DLCO 52%, no BD  Vanessa [name]  has a past medical history of Hypertension; Nonischemic cardiomyopathy; Dysrhythmia; Moderate mitral regurgitation; PVC's (premature ventricular contractions); and Pulmonary HTN.  Vanessa [name]  has past surgical history that includes Hysteroscopy w/D&C (12/02/2012); Cardiac catheterization (2003); Cardiac catheterization (2013  Nov); and left and right heart catheterization with coronary angiogram (N/A, 02/15/2015).  Prior to Admission medications   Medication Sig  Start Date End Date Taking? Authorizing Provider  aspirin 81 MG EC tablet Take 1 tablet (81 mg total) by mouth daily. 10/21/12  Yes Sueanne Margarita, MD  CALCIUM-VITAMIN D PO Take 1 tablet by mouth daily.   Yes Historical Provider, MD  carvedilol (COREG) 25 MG tablet Take 1 tablet (25 mg total) by mouth 2 (two) times daily with a meal. 04/08/15  Yes Sueanne Margarita, MD  furosemide (LASIX) 40 MG tablet Take 40 mg BID, unless weight increases 3lbs within a weeks time then increase to 60 mg BID x 3 days then back to 40 mg BID Patient taking differently: Take 80 mg by mouth 2 (two) times daily.  07/15/15  Yes Jolaine Artist, MD  ramipril (ALTACE) 5 MG capsule Take 1 capsule (5 mg total) by mouth 2 (two) times daily. 01/28/15  Yes Burtis Junes, NP  spironolactone (ALDACTONE) 25 MG tablet Take 1 tablet (25 mg total) by mouth daily. 06/12/15  Yes Larey Dresser, MD    No Known Allergies  Her family history includes Breast cancer in her sister; Heart disease in her father. There is no history of Colon cancer, Esophageal cancer, Rectal cancer, or Stomach cancer.  She  reports that she has never smoked. She has never used smokeless tobacco. She reports that she does not drink alcohol or use illicit drugs.  Review of Systems  Constitutional: Negative for fever and unexpected weight change.  HENT: Negative for congestion, dental problem, ear pain, nosebleeds,  postnasal drip, rhinorrhea, sinus pressure, sneezing, sore throat and trouble swallowing.   Eyes: Negative for redness and itching.  Respiratory: Positive for cough and shortness of breath. Negative for chest tightness and wheezing.   Cardiovascular: Negative for palpitations and leg swelling.  Gastrointestinal: Negative for nausea and vomiting.  Genitourinary: Negative for dysuria.  Musculoskeletal: Negative for joint swelling.  Skin: Negative for rash.  Neurological: Negative for headaches.  Hematological: Does not bruise/bleed easily.   Psychiatric/Behavioral: Negative for dysphoric mood. The patient is not nervous/anxious.    Physical Exam: BP 100/64 mmHg  Pulse 60  Temp(Src) 98.2 F (36.8 C) (Oral)  Ht 5\' 2"  (1.575 m)  Wt 215 lb (97.523 kg)  BMI 39.31 kg/m2  SpO2 99%  General - No distress ENT - No sinus tenderness, no oral exudate, no LAN, no thyromegaly, TM clear, pupils equal/reactive, scalloped tongue border Cardiac - s1s2 regular, 2/6 murmur, pulses symmetric Chest - No wheeze/rales/dullness, good air entry, normal respiratory excursion Back - No focal tenderness Abd - Soft, non-tender, no organomegaly, + bowel sounds Ext - No edema Neuro - Normal strength, cranial nerves intact Skin - No rashes Psych - Normal mood, and behavior   Lab Results  Component Value Date   WBC 4.2 02/11/2015   HGB 11.7* 02/11/2015   HCT 35.0* 02/11/2015   MCV 88.0 02/11/2015   PLT 189.0 02/11/2015    Lab Results  Component Value Date   CREATININE 1.50* 07/15/2015   BUN 36* 07/15/2015   NA 139 07/15/2015   K 5.1 07/15/2015   CL 105 07/15/2015   CO2 27 07/15/2015    Lab Results  Component Value Date   ALT 9 11/13/2008   AST 9 11/13/2008   ALKPHOS 63 11/13/2008   BILITOT 2.0* 11/13/2008    Lab Results  Component Value Date   TSH 0.600 11/13/2008    BNP    Component Value Date/Time   PROBNP 596.0* 06/06/2015 1553    Discussion: She has abnormal pulmonary function test.  She had mild restriction and moderate diffusion defect that corrected for lung volumes.  She had CT chest which was negative for interstitial lung disease.  She does not have history of smoking or asthma, and she denies symptoms of obstructive lung disease.  There is no history of neuromuscular disease or thrombo-embolic disease.  She does not believe she has sleep issues or sleep apnea.  I suspect her PFT findings are related to her mitral valve disease, combined heart failure, interstitial edema, and obesity.  Agree that she is not a  candidate for selective pulmonary vasodilators.  Plan:  No additional pulmonary testing or therapies needed at this time. She can f/u with cardiology for management of her mitral valve disease, combined heart failure. Advised her to monitor for symptoms suggestive of sleep apnea.   Chesley Mires, MD [facility] Pager:  [phone]

## 2015-08-01 NOTE — Progress Notes (Deleted)
   Subjective:    Patient ID: JOYAH DONHAM, female    DOB: [birth year], 64 y.o.   MRN: [id number]  HPI    Review of Systems  Constitutional: Negative for fever and unexpected weight change.  HENT: Negative for congestion, dental problem, ear pain, nosebleeds, postnasal drip, rhinorrhea, sinus pressure, sneezing, sore throat and trouble swallowing.   Eyes: Negative for redness and itching.  Respiratory: Positive for cough and shortness of breath. Negative for chest tightness and wheezing.   Cardiovascular: Negative for palpitations and leg swelling.  Gastrointestinal: Negative for nausea and vomiting.  Genitourinary: Negative for dysuria.  Musculoskeletal: Negative for joint swelling.  Skin: Negative for rash.  Neurological: Negative for headaches.  Hematological: Does not bruise/bleed easily.  Psychiatric/Behavioral: Negative for dysphoric mood. The patient is not nervous/anxious.        Objective:   Physical Exam        Assessment & Plan:

## 2015-08-01 NOTE — Patient Instructions (Signed)
Follow up with pulmonary as needed 

## 2015-08-07 ENCOUNTER — Other Ambulatory Visit: Payer: Self-pay

## 2015-08-07 ENCOUNTER — Telehealth: Payer: Self-pay | Admitting: Cardiology

## 2015-08-07 MED ORDER — CARVEDILOL 25 MG PO TABS: 25.0000 mg | ORAL_TABLET | Freq: Two times a day (BID) | ORAL | Status: DC

## 2015-08-07 NOTE — Telephone Encounter (Deleted)
ERROR

## 2015-08-19 NOTE — Telephone Encounter (Signed)
ERROR

## 2015-08-26 ENCOUNTER — Ambulatory Visit (HOSPITAL_COMMUNITY)
Admission: RE | Admit: 2015-08-26 | Discharge: 2015-08-26 | Disposition: A | Discharge status: Home / Self Care | Payer: No Typology Code available for payment source | Source: Ambulatory Visit | Attending: Cardiology | Admitting: Cardiology

## 2015-08-26 VITALS — BP 96/64 | HR 84 | Wt 218.0 lb

## 2015-08-26 DIAGNOSIS — N189 Chronic kidney disease, unspecified: Secondary | ICD-10-CM | POA: Insufficient documentation

## 2015-08-26 DIAGNOSIS — I340 Nonrheumatic mitral (valve) insufficiency: Secondary | ICD-10-CM | POA: Insufficient documentation

## 2015-08-26 DIAGNOSIS — Z79899 Other long term (current) drug therapy: Secondary | ICD-10-CM | POA: Insufficient documentation

## 2015-08-26 DIAGNOSIS — I428 Other cardiomyopathies: Secondary | ICD-10-CM | POA: Insufficient documentation

## 2015-08-26 DIAGNOSIS — I272 Other secondary pulmonary hypertension: Secondary | ICD-10-CM | POA: Diagnosis not present

## 2015-08-26 DIAGNOSIS — Z7982 Long term (current) use of aspirin: Secondary | ICD-10-CM | POA: Diagnosis not present

## 2015-08-26 DIAGNOSIS — I493 Ventricular premature depolarization: Secondary | ICD-10-CM | POA: Insufficient documentation

## 2015-08-26 DIAGNOSIS — I5022 Chronic systolic (congestive) heart failure: Secondary | ICD-10-CM | POA: Diagnosis not present

## 2015-08-26 LAB — BASIC METABOLIC PANEL
ANION GAP: 6 (ref 5–15)
BUN: 29 mg/dL — ABNORMAL HIGH (ref 6–20)
CALCIUM: 9.2 mg/dL (ref 8.9–10.3)
CO2: 29 mmol/L (ref 22–32)
Chloride: 105 mmol/L (ref 101–111)
Creatinine, Ser: 1.08 mg/dL — ABNORMAL HIGH (ref 0.44–1.00)
GFR, EST NON AFRICAN AMERICAN: 53 mL/min — AB (ref >60)
Glucose, Bld: 103 mg/dL — ABNORMAL HIGH (ref 65–99)
POTASSIUM: 4 mmol/L (ref 3.5–5.1)
SODIUM: 140 mmol/L (ref 135–145)

## 2015-08-26 LAB — BRAIN NATRIURETIC PEPTIDE: B NATRIURETIC PEPTIDE 5: 501.8 pg/mL — AB (ref 0.0–100.0)

## 2015-08-26 NOTE — Patient Instructions (Addendum)
No medication changes.  Your physician recommends that you schedule a follow-up appointment in: early December with echocardiogram  Your physician has requested that you have an echocardiogram. Echocardiography is a painless test that uses sound waves to create images of your heart. It provides your doctor with information about the size and shape of your heart and how well your heart's chambers and valves are working. This procedure takes approximately one hour. There are no restrictions for this procedure.  Do the following things EVERYDAY: 1) Weigh yourself in the morning before breakfast. Write it down and keep it in a log. 2) Take your medicines as prescribed 3) Eat low salt foods-Limit salt (sodium) to 2000 mg per day.  4) Stay as active as you can everyday 5) Limit all fluids for the day to less than 2 liters 6) \

## 2015-08-26 NOTE — Progress Notes (Signed)
Patient ID: Vanessa [name], female   DOB: 21-Oct-1951, 64 y.o.   MRN: [id number] PCP: Lennie Odor Primary Cardiologist: Dr. Radford Pax  64 yo with history of nonischemic cardiomyopathy presents for CHF clinic evaluation. Patient has had a low EF recognized since at least 9/14, when echo showed EF 40-45%.  In 07-01-2023, her child died, which led to significant stress.  She developed worsening exertional dyspnea and subsequent echo showed a fall in EF.  She had left and right heart catheterization in 3/16 with no significant coronary disease detected and moderately elevated right and left heart filling pressures along with pulmonary venous hypertension.  Most recent echo in 6/16 appeared to show some improvement with EF up to 40%.    She returns for follow up. Denies SOB/PND/Orthopnea. No palpitations. She weighs about 2 times a week. Taking all medications. Not exercising but does says she will do standing leg lifts. Lives alone.   Labs (7/16): K 4.5, creatinine 1.12, BNP 596 Labs (8/16): K 4 => 5.1, creatinine 1.29 => 1.5, BNP 388 => 426  PMH: 1. Cardiomyopathy: Nonischemic.  Echo (9/14) with EF 40-45%.  Echo (2/16) with EF 25-30%.  Echo (4/16) with EF 30-35%, moderate MR.  Echo (6/16) with EF 40%, moderate LV dilation, moderate diastolic dysfunction, moderate MR, moderate TR, PA systolic pressure 52 mmHg. RHC/LHC (3/16) with no significant CAD, EF 20-25%, 3+ MR; mean RA 13, PA 70/30 mean 45, mean PCWP 31 mmHg, CI 2.2, PVR 3.1 WU.  2. PVCs: Holter (10/15) with 12% PVCs. 3. Pulmonary venous hypertension: CTA chest (7/16) negative for PE. PFTs (7/16) with minimal obstruction, mild restriction, moderately decreased DLCO.  4. Thyroid nodules 5. CKD  SH: Lives alone, only child passed away in July 01, 2023, nonsmoker, no ETOH or drugs.   FH: Father with MIs  ROS: All systems reviewed and negative except as per HPI.   Current Outpatient Prescriptions  Medication Sig Dispense Refill  . aspirin 81 MG EC tablet Take  1 tablet (81 mg total) by mouth daily.    Marland Kitchen CALCIUM-VITAMIN D PO Take 1 tablet by mouth daily.    . carvedilol (COREG) 25 MG tablet Take 1 tablet (25 mg total) by mouth 2 (two) times daily with a meal. 60 tablet 6  . furosemide (LASIX) 40 MG tablet Take 40 mg BID, unless weight increases 3lbs within a weeks time then increase to 60 mg BID x 3 days then back to 40 mg BID (Patient taking differently: Take 80 mg by mouth 2 (two) times daily. ) 90 tablet 6  . ramipril (ALTACE) 5 MG capsule Take 1 capsule (5 mg total) by mouth 2 (two) times daily. 60 capsule 11  . spironolactone (ALDACTONE) 25 MG tablet Take 1 tablet (25 mg total) by mouth daily. 30 tablet 6   No current facility-administered medications for this encounter.   BP 96/64 mmHg  Pulse 84  Wt 218 lb (98.884 kg)  SpO2 98% General: NAD Neck: JVP 8 cm, no thyromegaly or thyroid nodule.  Lungs: Clear to auscultation bilaterally with normal respiratory effort. CV: Nondisplaced PMI.  Heart regular S1/S2, no S3/S4, 2/6 HSM LLSB/apex.  Trace ankle edema.  No carotid bruit.  Normal pedal pulses.  Abdomen: Soft, nontender, no hepatosplenomegaly, no distention.  Skin: Intact without lesions or rashes.  Neurologic: Alert and oriented x 3.  Psych: Normal affect. Extremities: No clubbing or cyanosis.  HEENT: Normal.   Assessment/Plan: 1. Chronic systolic CHF: Nonischemic cardiomyopathy.  EF 40% on last echo in 6/16.  She had frequent PVCs on 10/15 holter (12% total), but probably not enough to explain her cardiomyopathy.  Also, she has had significant improvement in palpitations since she has gone on Coreg so suspect less PVCs, but EF remains depressed. Cannot rule out myocarditis.  Takotsubo cardiomyopathy in setting of child's death would be a consideration, but EF has not fully recovered and low EF actually preceeded the stressful event.  She has NYHA class II symptoms and probably only minimal volume overload. - We discussed cardiac MRI to look  for infiltrative disease, etc. She does not think she could tolerate the MRI tube but would consider an open MRI - Continue current Lasix at 40 mg bid and spironolactone 25 mg daily.   - Continue current Coreg and ramipril. Ideally, would eventually start Bidil but BP remains a bit soft.  - Repeat echo in 12/16.   2. PVCs: Frequent (12%) on 10/15 holter.  Probably not enough to explain cardiomyopathy.  Suspect less now as palpitations have mostly resolved on Coreg.  - Recommended repeat holter monitor however she declined.   3. Pulmonary hypertension: Patient has primarily pulmonary venous hypertension (PVR 3.1) from elevated left atrial pressure (see RHC data above).  Treatment for this will be adequate diuresis, not selective pulmonary vasodilators.   4. Mitral regurgitation: Moderate functional MR on last echo.  5. CKD: Check BMET today.   Follow up in 12/16 with echo.   CLEGG,AMY 08/26/2015   Patient seen with NP, agree with the above note.  She is overall stable today.  Probably only mild volume overload.  Creatinine up recently, will repeat BMET today and keep Lasix at 40 mg bid.  No BP room for med titration today.  Encouraged sodium restriction.  She will need followup in the office in 12/16 with echocardiogram on that day.   [name] 08/26/2015 5:41 PM

## 2015-11-18 ENCOUNTER — Ambulatory Visit (HOSPITAL_BASED_OUTPATIENT_CLINIC_OR_DEPARTMENT_OTHER)
Admission: RE | Admit: 2015-11-18 | Discharge: 2015-11-18 | Disposition: A | Discharge status: Home / Self Care | Payer: No Typology Code available for payment source | Source: Ambulatory Visit | Attending: Cardiology | Admitting: Cardiology

## 2015-11-18 ENCOUNTER — Ambulatory Visit (HOSPITAL_COMMUNITY)
Admission: RE | Admit: 2015-11-18 | Discharge: 2015-11-18 | Disposition: A | Discharge status: Home / Self Care | Payer: No Typology Code available for payment source | Source: Ambulatory Visit | Attending: Cardiology | Admitting: Cardiology

## 2015-11-18 VITALS — BP 106/64 | HR 63 | Ht 62.0 in | Wt 221.4 lb

## 2015-11-18 DIAGNOSIS — I509 Heart failure, unspecified: Secondary | ICD-10-CM | POA: Diagnosis present

## 2015-11-18 DIAGNOSIS — I340 Nonrheumatic mitral (valve) insufficiency: Secondary | ICD-10-CM | POA: Diagnosis not present

## 2015-11-18 DIAGNOSIS — I10 Essential (primary) hypertension: Secondary | ICD-10-CM | POA: Insufficient documentation

## 2015-11-18 DIAGNOSIS — I517 Cardiomegaly: Secondary | ICD-10-CM | POA: Insufficient documentation

## 2015-11-18 DIAGNOSIS — I272 Other secondary pulmonary hypertension: Secondary | ICD-10-CM | POA: Diagnosis not present

## 2015-11-18 DIAGNOSIS — I5022 Chronic systolic (congestive) heart failure: Secondary | ICD-10-CM | POA: Insufficient documentation

## 2015-11-18 DIAGNOSIS — I493 Ventricular premature depolarization: Secondary | ICD-10-CM | POA: Diagnosis not present

## 2015-11-18 LAB — BASIC METABOLIC PANEL
Anion gap: 8 (ref 5–15)
BUN: 52 mg/dL — ABNORMAL HIGH (ref 6–20)
CO2: 20 mmol/L — ABNORMAL LOW (ref 22–32)
Calcium: 9 mg/dL (ref 8.9–10.3)
Chloride: 109 mmol/L (ref 101–111)
Creatinine, Ser: 1.57 mg/dL — ABNORMAL HIGH (ref 0.44–1.00)
GFR, EST AFRICAN AMERICAN: 39 mL/min — AB (ref >60)
GFR, EST NON AFRICAN AMERICAN: 34 mL/min — AB (ref >60)
Glucose, Bld: 110 mg/dL — ABNORMAL HIGH (ref 65–99)
Potassium: 4.9 mmol/L (ref 3.5–5.1)
Sodium: 137 mmol/L (ref 135–145)

## 2015-11-18 LAB — BRAIN NATRIURETIC PEPTIDE: B Natriuretic Peptide: 101.4 pg/mL — ABNORMAL HIGH (ref 0.0–100.0)

## 2015-11-18 NOTE — Progress Notes (Signed)
Advanced Heart Failure Medication Review by a Pharmacist  Does the patient  feel that his/her medications are working for him/her?  yes  Has the patient been experiencing any side effects to the medications prescribed?  no  Does the patient measure his/her own blood pressure or blood glucose at home?  no   Does the patient have any problems obtaining medications due to transportation or finances?   no  Understanding of regimen: good Understanding of indications: good Potential of compliance: good Patient understands to avoid NSAIDs. Patient understands to avoid decongestants.  Issues to address at subsequent visits: None   Pharmacist comments:  Ms. Tornetta is a 64 yo F presenting with her medication bottles. She reports good compliance with her medications and did not have any questions or concerns for me at this time.   Ruta Hinds. Velva Harman, PharmD, BCPS, CPP Clinical Pharmacist Pager: [phone] Phone: [phone] 11/18/2015 11:01 AM      Time with patient: 4 minutes Preparation and documentation time: 2 minutes Total time: 6 minutes

## 2015-11-18 NOTE — Progress Notes (Signed)
Patient ID: Vanessa [name], female   DOB: May 09, 1951, 64 y.o.   MRN: [id number]    Advanced Heart Failure Clinic Note   PCP: Lennie Odor Primary Cardiologist: Dr. Radford Pax HF Cardiology: Dr. Aundra Dubin  64 yo with history of nonischemic cardiomyopathy presents for CHF clinic evaluation. Patient has had a low EF recognized since at least 9/14, when echo showed EF 40-45%.  In 06-19-23, her child died, which led to significant stress.  She developed worsening exertional dyspnea and subsequent echo showed a fall in EF to 25-30%.  She had left and right heart catheterization in 3/16 with no significant coronary disease detected and moderately elevated right and left heart filling pressures along with pulmonary venous hypertension.  Most recent echo was done today and reviewed: EF 45%, diffuse hypokinesis. .    Weight up 3 lbs from last visit. Denies SOB or palpitations.  No chest pain.  No orthopnea/PND.  Generally seems to be doing ok.  Not lightheaded.   Labs (7/16): K 4.5, creatinine 1.12, BNP 596 Labs (8/16): K 4 => 5.1, creatinine 1.29 => 1.5, BNP 388 => 426 Labs (9/16): K 4, creatinine 1.08, BNP 502  PMH: 1. Cardiomyopathy: Nonischemic.  Echo (9/14) with EF 40-45%.  Echo (2/16) with EF 25-30%.  Echo (4/16) with EF 30-35%, moderate MR.  Echo (6/16) with EF 40%, moderate LV dilation, moderate diastolic dysfunction, moderate MR, moderate TR, PA systolic pressure 52 mmHg. RHC/LHC (3/16) with no significant CAD, EF 20-25%, 3+ MR; mean RA 13, PA 70/30 mean 45, mean PCWP 31 mmHg, CI 2.2, PVR 3.1 WU.  Echo (12/16) with EF 45%, diffuse hypokinesis, moderate MR, normal RV size and systolic function.  2. PVCs: Holter (10/15) with 12% PVCs. 3. Pulmonary venous hypertension: CTA chest (7/16) negative for PE. PFTs (7/16) with minimal obstruction, mild restriction, moderately decreased DLCO.  4. Thyroid nodules 5. CKD  SH: Lives alone, only child passed away in 06-19-23, nonsmoker, no ETOH or drugs.   FH: Father with  MIs  ROS: All systems reviewed and negative except as per HPI.   Current Outpatient Prescriptions  Medication Sig Dispense Refill  . aspirin 81 MG EC tablet Take 1 tablet (81 mg total) by mouth daily.    Marland Kitchen CALCIUM-VITAMIN D PO Take 1 tablet by mouth daily.    . carvedilol (COREG) 25 MG tablet Take 1 tablet (25 mg total) by mouth 2 (two) times daily with a meal. 60 tablet 6  . furosemide (LASIX) 40 MG tablet Take 40 mg BID, unless weight increases 3lbs within a weeks time then increase to 60 mg BID x 3 days then back to 40 mg BID 90 tablet 6  . ramipril (ALTACE) 5 MG capsule Take 1 capsule (5 mg total) by mouth 2 (two) times daily. 60 capsule 11  . spironolactone (ALDACTONE) 25 MG tablet Take 1 tablet (25 mg total) by mouth daily. 30 tablet 6   No current facility-administered medications for this encounter.   BP 106/64 mmHg  Pulse 63  Ht 5\' 2"  (1.575 m)  Wt 221 lb 6.4 oz (100.426 kg)  BMI 40.48 kg/m2  SpO2 100%   Wt Readings from Last 3 Encounters:  11/18/15 221 lb 6.4 oz (100.426 kg)  08/26/15 218 lb (98.884 kg)  08/01/15 215 lb (97.523 kg)    General: Elderly appearing, no acute distress. HEENT: Normal.  Neck: JVP 7 cm, no thyromegaly or lymphadenopathy.   Lungs: CTAB, normal effort CV: Nondisplaced PMI.  Heart regular S1/S2, no S3/S4,  2/6 HSM LLSB/apex.    No carotid bruit.   Abdomen: Soft, NT, ND, no HSM. No bruits or masses. +BS Skin: Intact without lesions or rashes.  Neurologic: Alert and oriented x 3.  Psych: Flat affect. Extremities: No clubbing or cyanosis. Normal pedal pulses. Trace bilateral ankle edema.  Assessment/Plan: 1. Chronic systolic CHF: Nonischemic cardiomyopathy.  EF up to 45% on today's echo.  NYHA class II symptoms.  Etiology of NICM still unclear.  Had frequent PVCs by past holter (12%) but doesn't correlate to her cardiomyopathy. Had improvement of palpitations on Coreg. Cannot rule out myocarditis. May also have component of Takotsubo with passing  of child but EF has not improved quite as fas. Have previously discussed cardiac MRI to look for infiltrative disease but she says that she woule be too claustrophobic. - Continue current Lasix 40 mg bid and spiro 25 mg daily.   - Continue current Coreg and ramipril. No room to uptitrate or add bidil with soft BP.  2. PVCs: Frequent (12%) on 10/15 holter.   Pt refused repeat holter monitor for further assessment.  - Improved palpitations on Coreg.  Relatively low PVC count out of proportion to cardiomyopathy, unlikely primary etiology. 3. Pulmonary hypertension: Primarily pulmonary venous HTN (PVR 3.1) from elevated left atrial pressure (previous RHC above).   - Best treatment remains adequate diuresis. 4. Mitral regurgitation: Stable by Echo today. Moderate MR.  5. CKD: BMET today.   Follow up in 4 months.   Satira Mccallum Tillery PA-C 11/18/2015   Patient seen with PA, agree with the above note.  I reviewed today's echo: EF up to 45% with medical treatment, outside ICD range . Continue current meds, no BP room to titrate .   Refuses repeat holter, but rarely feeling palpitations now.  I think PVCs have decreased with increased Coreg.   [name] 11/18/2015

## 2015-11-18 NOTE — Patient Instructions (Signed)
We will contact you in 4 months to schedule your next appointment.  

## 2015-11-18 NOTE — Progress Notes (Signed)
Echocardiogram 2D Echocardiogram has been performed.  Vanessa [name] 11/18/2015, 10:04 AM

## 2015-11-19 ENCOUNTER — Telehealth (HOSPITAL_COMMUNITY): Payer: Self-pay | Admitting: *Deleted

## 2015-11-19 MED ORDER — FUROSEMIDE 40 MG PO TABS: ORAL_TABLET | ORAL | Status: DC

## 2015-11-19 NOTE — Telephone Encounter (Signed)
-----   Message from Larey Dresser, MD sent at 11/18/2015  5:14 PM EST ----- High creatinine and BUN, decrease Lasix to once a day instead of BID.

## 2015-11-19 NOTE — Telephone Encounter (Signed)
Notes Recorded by Scarlette Calico, RN on 11/19/2015 at 10:17 AM Pt aware and verbalizes understanding

## 2015-12-11 ENCOUNTER — Ambulatory Visit: Payer: No Typology Code available for payment source | Admitting: Cardiology

## 2016-01-06 ENCOUNTER — Other Ambulatory Visit: Payer: Self-pay

## 2016-01-06 DIAGNOSIS — Z1231 Encounter for screening mammogram for malignant neoplasm of breast: Secondary | ICD-10-CM

## 2016-01-07 ENCOUNTER — Telehealth: Payer: Self-pay | Admitting: Cardiology

## 2016-01-07 ENCOUNTER — Other Ambulatory Visit (HOSPITAL_COMMUNITY): Payer: Self-pay | Admitting: *Deleted

## 2016-01-07 MED ORDER — SPIRONOLACTONE 25 MG PO TABS: 25.0000 mg | ORAL_TABLET | Freq: Every day | ORAL | Status: DC

## 2016-01-07 MED ORDER — FUROSEMIDE 40 MG PO TABS: ORAL_TABLET | ORAL | Status: DC

## 2016-01-07 NOTE — Telephone Encounter (Signed)
Pt calling requesting a refill on spirolactone 25 mg and furosemide 40 mg tablet. Please advise

## 2016-01-08 ENCOUNTER — Other Ambulatory Visit (HOSPITAL_COMMUNITY): Payer: Self-pay | Admitting: Cardiology

## 2016-01-08 NOTE — Telephone Encounter (Signed)
Pt's Rx was refilled and sent to pt's pharmacy, per [facility].Marland KitchenMarland Kitchen

## 2016-01-30 ENCOUNTER — Other Ambulatory Visit: Payer: Self-pay | Admitting: *Deleted

## 2016-01-30 MED ORDER — RAMIPRIL 5 MG PO CAPS: 5.0000 mg | ORAL_CAPSULE | Freq: Two times a day (BID) | ORAL | Status: DC

## 2016-02-12 ENCOUNTER — Ambulatory Visit: Payer: No Typology Code available for payment source

## 2016-02-19 ENCOUNTER — Ambulatory Visit
Admission: RE | Admit: 2016-02-19 | Discharge: 2016-02-19 | Disposition: A | Discharge status: Home / Self Care | Payer: BLUE CROSS/BLUE SHIELD | Source: Ambulatory Visit

## 2016-02-19 DIAGNOSIS — Z1231 Encounter for screening mammogram for malignant neoplasm of breast: Secondary | ICD-10-CM

## 2016-03-05 ENCOUNTER — Other Ambulatory Visit (HOSPITAL_COMMUNITY): Payer: Self-pay | Admitting: *Deleted

## 2016-03-05 ENCOUNTER — Telehealth (HOSPITAL_COMMUNITY): Payer: Self-pay | Admitting: Vascular Surgery

## 2016-03-05 MED ORDER — CARVEDILOL 25 MG PO TABS: 25.0000 mg | ORAL_TABLET | Freq: Two times a day (BID) | ORAL | Status: DC

## 2016-03-05 NOTE — Telephone Encounter (Signed)
Pt needs refill for Carvedilol

## 2016-04-21 ENCOUNTER — Encounter (HOSPITAL_COMMUNITY): Payer: Self-pay

## 2016-04-21 ENCOUNTER — Ambulatory Visit (HOSPITAL_COMMUNITY)
Admission: RE | Admit: 2016-04-21 | Discharge: 2016-04-21 | Disposition: A | Discharge status: Home / Self Care | Payer: BLUE CROSS/BLUE SHIELD | Source: Ambulatory Visit | Attending: Cardiology | Admitting: Cardiology

## 2016-04-21 VITALS — BP 114/78 | HR 74 | Wt 223.0 lb

## 2016-04-21 DIAGNOSIS — Z7982 Long term (current) use of aspirin: Secondary | ICD-10-CM | POA: Insufficient documentation

## 2016-04-21 DIAGNOSIS — Z79899 Other long term (current) drug therapy: Secondary | ICD-10-CM | POA: Insufficient documentation

## 2016-04-21 DIAGNOSIS — I429 Cardiomyopathy, unspecified: Secondary | ICD-10-CM | POA: Insufficient documentation

## 2016-04-21 DIAGNOSIS — I340 Nonrheumatic mitral (valve) insufficiency: Secondary | ICD-10-CM | POA: Diagnosis not present

## 2016-04-21 DIAGNOSIS — I493 Ventricular premature depolarization: Secondary | ICD-10-CM | POA: Insufficient documentation

## 2016-04-21 DIAGNOSIS — R002 Palpitations: Secondary | ICD-10-CM | POA: Insufficient documentation

## 2016-04-21 DIAGNOSIS — I5022 Chronic systolic (congestive) heart failure: Secondary | ICD-10-CM

## 2016-04-21 DIAGNOSIS — I272 Other secondary pulmonary hypertension: Secondary | ICD-10-CM | POA: Diagnosis not present

## 2016-04-21 DIAGNOSIS — N189 Chronic kidney disease, unspecified: Secondary | ICD-10-CM | POA: Diagnosis not present

## 2016-04-21 DIAGNOSIS — I129 Hypertensive chronic kidney disease with stage 1 through stage 4 chronic kidney disease, or unspecified chronic kidney disease: Secondary | ICD-10-CM | POA: Insufficient documentation

## 2016-04-21 LAB — BASIC METABOLIC PANEL
ANION GAP: 6 (ref 5–15)
BUN: 48 mg/dL — AB (ref 6–20)
CO2: 26 mmol/L (ref 22–32)
CREATININE: 1.49 mg/dL — AB (ref 0.44–1.00)
Calcium: 9.6 mg/dL (ref 8.9–10.3)
Chloride: 105 mmol/L (ref 101–111)
GFR calc Af Amer: 42 mL/min — ABNORMAL LOW (ref >60)
GFR, EST NON AFRICAN AMERICAN: 36 mL/min — AB (ref >60)
GLUCOSE: 89 mg/dL (ref 65–99)
Potassium: 5.8 mmol/L — ABNORMAL HIGH (ref 3.5–5.1)
Sodium: 137 mmol/L (ref 135–145)

## 2016-04-21 LAB — BRAIN NATRIURETIC PEPTIDE: B Natriuretic Peptide: 144.4 pg/mL — ABNORMAL HIGH (ref 0.0–100.0)

## 2016-04-21 NOTE — Progress Notes (Signed)
Patient ID: RAGNHILD FORNESS, female   DOB: 05/04/1951, 65 y.o.   MRN: [id number]    Advanced Heart Failure Clinic Note   PCP: Lennie Odor Primary Cardiologist: Dr. Radford Pax HF Cardiology: Dr. Aundra Dubin  65 yo with history of nonischemic cardiomyopathy presents for CHF clinic evaluation. Patient has had a low EF recognized since at least 9/14, when echo showed EF 40-45%.  In 06-24-2023, her child died, which led to significant stress.  She developed worsening exertional dyspnea and subsequent echo showed a fall in EF to 25-30%.  She had left and right heart catheterization in 3/16 with no significant coronary disease detected and moderately elevated right and left heart filling pressures along with pulmonary venous hypertension.  Most recent echo was done 12/16: EF 45%, diffuse hypokinesis. .    Weight stable. No dyspnea walking at a steady pace on flat ground.  Dyspnea if she walks fast after walking for about 3-5 minutes.  Mild dyspnea walking up steps.  No chest pain.  No orthopnea/PND.  Generally seems to be doing ok.  Not lightheaded. Lasix decreased to once a day after creatinine noted to be high when checked in 12/16. She is [facility] aerobics at senior center 5 days/[facility].   Labs (7/16): K 4.5, creatinine 1.12, BNP 596 Labs (8/16): K 4 => 5.1, creatinine 1.29 => 1.5, BNP 388 => 426 Labs (9/16): K 4, creatinine 1.08, BNP 502 Labs (12/16): K 4.9, creatinine 1.57  PMH: 1. Cardiomyopathy: Nonischemic.  Echo (9/14) with EF 40-45%.  Echo (2/16) with EF 25-30%.  Echo (4/16) with EF 30-35%, moderate MR.  Echo (6/16) with EF 40%, moderate LV dilation, moderate diastolic dysfunction, moderate MR, moderate TR, PA systolic pressure 52 mmHg. RHC/LHC (3/16) with no significant CAD, EF 20-25%, 3+ MR; mean RA 13, PA 70/30 mean 45, mean PCWP 31 mmHg, CI 2.2, PVR 3.1 WU.  Echo (12/16) with EF 45%, diffuse hypokinesis, moderate MR, normal RV size and systolic function.  2. PVCs: Holter (10/15) with 12% PVCs. 3. Pulmonary  venous hypertension: CTA chest (7/16) negative for PE. PFTs (7/16) with minimal obstruction, mild restriction, moderately decreased DLCO.  4. Thyroid nodules 5. CKD  SH: Lives alone, only child passed away in 24-Jun-2023, nonsmoker, no ETOH or drugs.   FH: Father with MIs  ROS: All systems reviewed and negative except as per HPI.   Current Outpatient Prescriptions  Medication Sig Dispense Refill  . aspirin 81 MG EC tablet Take 1 tablet (81 mg total) by mouth daily.    Marland Kitchen CALCIUM-VITAMIN D PO Take 1 tablet by mouth daily.    . carvedilol (COREG) 25 MG tablet Take 1 tablet (25 mg total) by mouth 2 (two) times daily with a meal. 60 tablet 6  . furosemide (LASIX) 40 MG tablet Take 40 mg daily, unless weight increases 3lbs within a weeks time then increase to 60 mg BID x 3 days then back to 40 mg BID 90 tablet 6  . furosemide (LASIX) 40 MG tablet Take 40 mg daily, unless weight increases 3lbs within a weeks time then increase to 60 mg BID x 3 days then back to 40 mg BID 90 tablet 0  . ramipril (ALTACE) 5 MG capsule Take 1 capsule (5 mg total) by mouth 2 (two) times daily. 60 capsule 11  . spironolactone (ALDACTONE) 25 MG tablet Take 1 tablet (25 mg total) by mouth daily. 30 tablet 6   No current facility-administered medications for this encounter.   BP 114/78 mmHg  Pulse  74  Wt 223 lb (101.152 kg)  SpO2 98%   Wt Readings from Last 3 Encounters:  04/21/16 223 lb (101.152 kg)  11/18/15 221 lb 6.4 oz (100.426 kg)  08/26/15 218 lb (98.884 kg)    General: Elderly appearing, no acute distress. HEENT: Normal.  Neck: JVP 7 cm, no thyromegaly or lymphadenopathy.   Lungs: CTAB, normal effort CV: Nondisplaced PMI.  Heart regular S1/S2, no S3/S4, 2/6 HSM LLSB/apex.    No carotid bruit.   Abdomen: Soft, NT, ND, no HSM. No bruits or masses. +BS Skin: Intact without lesions or rashes.  Neurologic: Alert and oriented x 3.  Psych: Flat affect. Extremities: No clubbing or cyanosis. Normal pedal pulses.  No edema.  Assessment/Plan: 1. Chronic systolic CHF: Nonischemic cardiomyopathy.  EF up to 45% on 12/16.  NYHA class II symptoms.  Etiology of NICM still unclear.  Had frequent PVCs by past holter (12%) but doesn't correlate to her cardiomyopathy. Had improvement of palpitations on Coreg. Cannot rule out myocarditis. May also have component of Takotsubo with passing of child but EF has not improved quite as fast or completely as would be expected with Takotsubo. Have previously discussed cardiac MRI to look for infiltrative disease but she says that she would be too claustrophobic. - Continue Lasix 40 daily, BMET/BNP today.   - Continue spironolactone.  Will get BMET today and again in 3 months.  - Continue current Coreg and ramipril.  - Repeat echo in 12/17.  2. PVCs: Frequent (12%) on 10/15 holter.   Pt refused repeat holter monitor for further assessment.  - Improved palpitations on Coreg.  Relatively low PVC count out of proportion to cardiomyopathy, unlikely primary etiology. 3. Pulmonary hypertension: Primarily pulmonary venous HTN (PVR 3.1) from elevated left atrial pressure (previous RHC above).   - Best treatment remains adequate diuresis. 4. Mitral regurgitation:  Moderate MR.  5. CKD: BMET today.   Follow up in 6 months.   [name]  04/21/2016

## 2016-04-21 NOTE — Patient Instructions (Signed)
Labs today  Labs in 3 months  We will contact you in 6 months to schedule your next appointment and echocardiogram

## 2016-04-28 ENCOUNTER — Telehealth (HOSPITAL_COMMUNITY): Payer: Self-pay | Admitting: *Deleted

## 2016-04-28 DIAGNOSIS — I5022 Chronic systolic (congestive) heart failure: Secondary | ICD-10-CM

## 2016-04-28 NOTE — Telephone Encounter (Signed)
Notes Recorded by Scarlette Calico, RN on 04/28/2016 at 11:10 AM Pt aware, she will restart Arlyce Harman 12.5 mg daily and have repeat labs tomorrow Notes Recorded by Scarlette Calico, RN on 04/24/2016 at 1:31 PM Left message to call back Notes Recorded by Larey Dresser, MD on 04/24/2016 at 10:36 AM Make sure no supplemental K. Hold spironolactone until Tuesday. Repeat BMET Tuesday and restart spironolactone at 12.5 mg daily. Get BMET again 1 week after that.

## 2016-04-29 ENCOUNTER — Other Ambulatory Visit (INDEPENDENT_AMBULATORY_CARE_PROVIDER_SITE_OTHER): Payer: BLUE CROSS/BLUE SHIELD | Admitting: *Deleted

## 2016-04-29 DIAGNOSIS — I10 Essential (primary) hypertension: Secondary | ICD-10-CM | POA: Diagnosis not present

## 2016-04-29 LAB — BASIC METABOLIC PANEL
BUN: 49 mg/dL — ABNORMAL HIGH (ref 7–25)
CALCIUM: 9.2 mg/dL (ref 8.6–10.4)
CO2: 22 mmol/L (ref 20–31)
Chloride: 108 mmol/L (ref 98–110)
Creat: 1.3 mg/dL — ABNORMAL HIGH (ref 0.50–0.99)
Glucose, Bld: 89 mg/dL (ref 65–99)
Potassium: 5.2 mmol/L (ref 3.5–5.3)
SODIUM: 139 mmol/L (ref 135–146)

## 2016-04-29 NOTE — Addendum Note (Signed)
Addended by: Eulis Foster on: 04/29/2016 11:44 AM   Modules accepted: Orders

## 2016-04-29 NOTE — Addendum Note (Signed)
Addended by: Eulis Foster on: 04/29/2016 11:43 AM   Modules accepted: Orders

## 2016-07-08 ENCOUNTER — Other Ambulatory Visit (HOSPITAL_COMMUNITY): Payer: Self-pay | Admitting: Cardiology

## 2016-07-08 MED ORDER — FUROSEMIDE 40 MG PO TABS: ORAL_TABLET | ORAL | 6 refills | Status: DC

## 2016-07-27 ENCOUNTER — Ambulatory Visit (HOSPITAL_COMMUNITY)
Admission: RE | Admit: 2016-07-27 | Discharge: 2016-07-27 | Disposition: A | Discharge status: Home / Self Care | Payer: BLUE CROSS/BLUE SHIELD | Source: Ambulatory Visit | Attending: Cardiology | Admitting: Cardiology

## 2016-07-27 DIAGNOSIS — I5022 Chronic systolic (congestive) heart failure: Secondary | ICD-10-CM | POA: Diagnosis not present

## 2016-07-27 LAB — BRAIN NATRIURETIC PEPTIDE: B Natriuretic Peptide: 152.4 pg/mL — ABNORMAL HIGH (ref 0.0–100.0)

## 2016-08-11 ENCOUNTER — Telehealth (HOSPITAL_COMMUNITY): Payer: Self-pay | Admitting: Vascular Surgery

## 2016-08-11 NOTE — Telephone Encounter (Signed)
LEFT PT MESSAGE TO LET HER KNOW ABOUT ECHO APPT

## 2016-10-09 ENCOUNTER — Ambulatory Visit (HOSPITAL_BASED_OUTPATIENT_CLINIC_OR_DEPARTMENT_OTHER)
Admission: RE | Admit: 2016-10-09 | Discharge: 2016-10-09 | Disposition: A | Discharge status: Home / Self Care | Payer: PPO | Source: Ambulatory Visit

## 2016-10-09 ENCOUNTER — Ambulatory Visit (HOSPITAL_COMMUNITY)
Admission: RE | Admit: 2016-10-09 | Discharge: 2016-10-09 | Disposition: A | Discharge status: Home / Self Care | Payer: PPO | Source: Ambulatory Visit | Attending: Cardiology | Admitting: Cardiology

## 2016-10-09 VITALS — BP 112/76 | HR 74 | Resp 18 | Wt 228.5 lb

## 2016-10-09 DIAGNOSIS — I493 Ventricular premature depolarization: Secondary | ICD-10-CM | POA: Diagnosis not present

## 2016-10-09 DIAGNOSIS — I272 Pulmonary hypertension, unspecified: Secondary | ICD-10-CM | POA: Diagnosis not present

## 2016-10-09 DIAGNOSIS — I428 Other cardiomyopathies: Secondary | ICD-10-CM | POA: Insufficient documentation

## 2016-10-09 DIAGNOSIS — N189 Chronic kidney disease, unspecified: Secondary | ICD-10-CM | POA: Diagnosis not present

## 2016-10-09 DIAGNOSIS — I340 Nonrheumatic mitral (valve) insufficiency: Secondary | ICD-10-CM | POA: Insufficient documentation

## 2016-10-09 DIAGNOSIS — I130 Hypertensive heart and chronic kidney disease with heart failure and stage 1 through stage 4 chronic kidney disease, or unspecified chronic kidney disease: Secondary | ICD-10-CM | POA: Diagnosis not present

## 2016-10-09 DIAGNOSIS — I5022 Chronic systolic (congestive) heart failure: Secondary | ICD-10-CM | POA: Diagnosis not present

## 2016-10-09 DIAGNOSIS — Z7982 Long term (current) use of aspirin: Secondary | ICD-10-CM | POA: Insufficient documentation

## 2016-10-09 DIAGNOSIS — Z79899 Other long term (current) drug therapy: Secondary | ICD-10-CM | POA: Diagnosis not present

## 2016-10-09 LAB — BASIC METABOLIC PANEL
Anion gap: 8 (ref 5–15)
BUN: 30 mg/dL — AB (ref 6–20)
CALCIUM: 9.7 mg/dL (ref 8.9–10.3)
CO2: 22 mmol/L (ref 22–32)
Chloride: 107 mmol/L (ref 101–111)
Creatinine, Ser: 1.32 mg/dL — ABNORMAL HIGH (ref 0.44–1.00)
GFR calc Af Amer: 48 mL/min — ABNORMAL LOW (ref >60)
GFR, EST NON AFRICAN AMERICAN: 41 mL/min — AB (ref >60)
GLUCOSE: 99 mg/dL (ref 65–99)
Potassium: 4.9 mmol/L (ref 3.5–5.1)
SODIUM: 137 mmol/L (ref 135–145)

## 2016-10-09 LAB — BRAIN NATRIURETIC PEPTIDE: B NATRIURETIC PEPTIDE 5: 236.1 pg/mL — AB (ref 0.0–100.0)

## 2016-10-09 MED ORDER — CARVEDILOL 25 MG PO TABS: 25.0000 mg | ORAL_TABLET | Freq: Two times a day (BID) | ORAL | 2 refills | Status: DC

## 2016-10-09 NOTE — Progress Notes (Signed)
  Echocardiogram 2D Echocardiogram has been performed.  Tresa Res 10/09/2016, 9:54 AM

## 2016-10-09 NOTE — Patient Instructions (Signed)
Labs today We will only contact you if something comes back abnormal or we need to make some changes. Otherwise no news is good news!  Your physician recommends that you schedule a follow-up appointment in: 6 months with Dr Aundra Dubin   Do the following things EVERYDAY: 1) Weigh yourself in the morning before breakfast. Write it down and keep it in a log. 2) Take your medicines as prescribed 3) Eat low salt foods-Limit salt (sodium) to 2000 mg per day.  4) Stay as active as you can everyday 5) Limit all fluids for the day to less than 2 liters

## 2016-10-10 NOTE — Progress Notes (Signed)
Patient ID: SERINITY WARE, female   DOB: [birth year], 65 y.o.   MRN: [id number]    Advanced Heart Failure Clinic Note   PCP: Lennie Odor Primary Cardiologist: Dr. Radford Pax HF Cardiology: Dr. Aundra Dubin  65 yo with history of nonischemic cardiomyopathy presents for CHF clinic evaluation. Patient has had a low EF recognized since at least 9/14, when echo showed EF 40-45%.  In June 23, 2023, her child died, which led to significant stress.  She developed worsening exertional dyspnea and subsequent echo showed a fall in EF to 25-30%.  She had left and right heart catheterization in 3/16 with no significant coronary disease detected and moderately elevated right and left heart filling pressures along with pulmonary venous hypertension.  Echo was done 12/16: EF 45%, diffuse hypokinesis. She had a repeat echo today => EF 45% with moderately dilated LV, moderate MR.    Weight stable. No dyspnea walking at a steady pace on flat ground.  Mild dyspnea walking up steps but does not have to stop.  No chest pain.  No orthopnea/PND.  Not lightheaded. Lasix decreased to once a day after creatinine noted to be high when checked in 12/16.   ECG: NSR, diffuse T wave flattening   Labs (7/16): K 4.5, creatinine 1.12, BNP 596 Labs (8/16): K 4 => 5.1, creatinine 1.29 => 1.5, BNP 388 => 426 Labs (9/16): K 4, creatinine 1.08, BNP 502 Labs (12/16): K 4.9, creatinine 1.57 Labs (5/17): K 5.2, creatinine 1.3 Labs (8/17): BNP 152  PMH: 1. Cardiomyopathy: Nonischemic.  Echo (9/14) with EF 40-45%.  Echo (2/16) with EF 25-30%.  Echo (4/16) with EF 30-35%, moderate MR.  Echo (6/16) with EF 40%, moderate LV dilation, moderate diastolic dysfunction, moderate MR, moderate TR, PA systolic pressure 52 mmHg. RHC/LHC (3/16) with no significant CAD, EF 20-25%, 3+ MR; mean RA 13, PA 70/30 mean 45, mean PCWP 31 mmHg, CI 2.2, PVR 3.1 WU.  Echo (12/16) with EF 45%, diffuse hypokinesis, moderate MR, normal RV size and systolic function.  - Echo  (11/17): EF 45%, moderately dilated LV, moderate eccentric posterior MR, PASP 42 mmHg, normal RV size and systolic function.  2. PVCs: Holter (10/15) with 12% PVCs. 3. Pulmonary venous hypertension: CTA chest (7/16) negative for PE. PFTs (7/16) with minimal obstruction, mild restriction, moderately decreased DLCO.  4. Thyroid nodules 5. CKD  SH: Lives alone, only child passed away in 06/23/23, nonsmoker, no ETOH or drugs.   FH: Father with MIs  ROS: All systems reviewed and negative except as per HPI.   Current Outpatient Prescriptions  Medication Sig Dispense Refill  . aspirin 81 MG EC tablet Take 1 tablet (81 mg total) by mouth daily.    Marland Kitchen CALCIUM-VITAMIN D PO Take 1 tablet by mouth daily.    . carvedilol (COREG) 25 MG tablet Take 1 tablet (25 mg total) by mouth 2 (two) times daily with a meal. 180 tablet 2  . furosemide (LASIX) 40 MG tablet Take 40 mg daily, unless weight increases 3lbs within a weeks time then increase to 60 mg BID x 3 days then back to 40 mg BID 90 tablet 6  . ramipril (ALTACE) 5 MG capsule Take 1 capsule (5 mg total) by mouth 2 (two) times daily. 60 capsule 11  . spironolactone (ALDACTONE) 25 MG tablet Take 12.5 mg by mouth daily.     No current facility-administered medications for this encounter.    BP 112/76 (BP Location: Left Arm, Patient Position: Sitting, Cuff Size: Normal)   Pulse  74   Resp 18   Wt 228 lb 8 oz (103.6 kg)   SpO2 97%   BMI 41.79 kg/m    Wt Readings from Last 3 Encounters:  10/09/16 228 lb 8 oz (103.6 kg)  04/21/16 223 lb (101.2 kg)  11/18/15 221 lb 6.4 oz (100.4 kg)    General: Elderly appearing, no acute distress. HEENT: Normal.  Neck: JVP 7 cm, no thyromegaly or lymphadenopathy.   Lungs: CTAB, normal effort CV: Nondisplaced PMI.  Heart regular S1/S2, no S3/S4, 2/6 HSM LLSB/apex.    No carotid bruit.   Abdomen: Soft, NT, ND, no HSM. No bruits or masses. +BS Skin: Intact without lesions or rashes.  Neurologic: Alert and oriented x  3.  Psych: Flat affect. Extremities: No clubbing or cyanosis. Normal pedal pulses.  Trace ankle edema.  Assessment/Plan: 1. Chronic systolic CHF: Nonischemic cardiomyopathy.  EF up to 45% on 12/16, repeat echo 11/17 shows that EF remains about 45%.  NYHA class II symptoms.  Etiology of NICM still unclear.  Had frequent PVCs by past holter (12%) but doesn't correlate to her cardiomyopathy. Had improvement of palpitations on Coreg. Cannot rule out myocarditis. May also have component of Takotsubo with passing of child but EF has not improved quite as fast or completely as would be expected with Takotsubo. Have previously discussed cardiac MRI to look for infiltrative disease but she says that she would be too claustrophobic. - Continue Lasix 40 daily, BMET today.   - Continue spironolactone.   - Continue current Coreg and ramipril.   2. PVCs: Frequent (12%) on 10/15 holter.   Pt refused repeat holter monitor for further assessment.  - Improved palpitations on Coreg.  Relatively low PVC count out of proportion to cardiomyopathy, unlikely primary etiology. 3. Pulmonary hypertension: Primarily pulmonary venous HTN (PVR 3.1) from elevated left atrial pressure (previous RHC above).   - Best treatment remains adequate diuresis. 4. Mitral regurgitation:  Moderate MR.  5. CKD: BMET today.   Follow up in 6 months.   [name]  10/10/2016

## 2016-11-09 ENCOUNTER — Other Ambulatory Visit (HOSPITAL_COMMUNITY): Payer: Self-pay | Admitting: Cardiology

## 2016-11-09 MED ORDER — SPIRONOLACTONE 25 MG PO TABS: 12.5000 mg | ORAL_TABLET | Freq: Every day | ORAL | 3 refills | Status: DC

## 2017-01-11 ENCOUNTER — Other Ambulatory Visit: Payer: Self-pay | Admitting: Physician Assistant

## 2017-01-11 DIAGNOSIS — Z1231 Encounter for screening mammogram for malignant neoplasm of breast: Secondary | ICD-10-CM

## 2017-02-10 ENCOUNTER — Other Ambulatory Visit (HOSPITAL_COMMUNITY): Payer: Self-pay

## 2017-02-10 MED ORDER — RAMIPRIL 5 MG PO CAPS: 5.0000 mg | ORAL_CAPSULE | Freq: Two times a day (BID) | ORAL | 11 refills | Status: DC

## 2017-02-19 ENCOUNTER — Ambulatory Visit
Admission: RE | Admit: 2017-02-19 | Discharge: 2017-02-19 | Disposition: A | Discharge status: Home / Self Care | Payer: PPO | Source: Ambulatory Visit | Attending: Physician Assistant | Admitting: Physician Assistant

## 2017-02-19 DIAGNOSIS — Z1231 Encounter for screening mammogram for malignant neoplasm of breast: Secondary | ICD-10-CM | POA: Diagnosis not present

## 2017-02-22 ENCOUNTER — Other Ambulatory Visit: Payer: Self-pay | Admitting: Physician Assistant

## 2017-02-22 DIAGNOSIS — R928 Other abnormal and inconclusive findings on diagnostic imaging of breast: Secondary | ICD-10-CM

## 2017-02-24 ENCOUNTER — Ambulatory Visit
Admission: RE | Admit: 2017-02-24 | Discharge: 2017-02-24 | Disposition: A | Discharge status: Home / Self Care | Payer: PPO | Source: Ambulatory Visit | Attending: Physician Assistant | Admitting: Physician Assistant

## 2017-02-24 DIAGNOSIS — N6011 Diffuse cystic mastopathy of right breast: Secondary | ICD-10-CM | POA: Diagnosis not present

## 2017-02-24 DIAGNOSIS — R928 Other abnormal and inconclusive findings on diagnostic imaging of breast: Secondary | ICD-10-CM

## 2017-04-07 ENCOUNTER — Encounter (HOSPITAL_COMMUNITY): Payer: Self-pay

## 2017-04-07 ENCOUNTER — Ambulatory Visit (HOSPITAL_COMMUNITY)
Admission: RE | Admit: 2017-04-07 | Discharge: 2017-04-07 | Disposition: A | Discharge status: Home / Self Care | Payer: PPO | Source: Ambulatory Visit | Attending: Cardiology | Admitting: Cardiology

## 2017-04-07 VITALS — BP 126/73 | HR 67 | Ht 62.0 in | Wt 226.4 lb

## 2017-04-07 DIAGNOSIS — Z8249 Family history of ischemic heart disease and other diseases of the circulatory system: Secondary | ICD-10-CM | POA: Diagnosis not present

## 2017-04-07 DIAGNOSIS — I340 Nonrheumatic mitral (valve) insufficiency: Secondary | ICD-10-CM | POA: Insufficient documentation

## 2017-04-07 DIAGNOSIS — I130 Hypertensive heart and chronic kidney disease with heart failure and stage 1 through stage 4 chronic kidney disease, or unspecified chronic kidney disease: Secondary | ICD-10-CM | POA: Diagnosis not present

## 2017-04-07 DIAGNOSIS — Z7982 Long term (current) use of aspirin: Secondary | ICD-10-CM | POA: Insufficient documentation

## 2017-04-07 DIAGNOSIS — Z79899 Other long term (current) drug therapy: Secondary | ICD-10-CM | POA: Insufficient documentation

## 2017-04-07 DIAGNOSIS — N189 Chronic kidney disease, unspecified: Secondary | ICD-10-CM | POA: Insufficient documentation

## 2017-04-07 DIAGNOSIS — I493 Ventricular premature depolarization: Secondary | ICD-10-CM | POA: Insufficient documentation

## 2017-04-07 DIAGNOSIS — Z7901 Long term (current) use of anticoagulants: Secondary | ICD-10-CM | POA: Diagnosis not present

## 2017-04-07 DIAGNOSIS — I5022 Chronic systolic (congestive) heart failure: Secondary | ICD-10-CM | POA: Diagnosis not present

## 2017-04-07 DIAGNOSIS — I429 Cardiomyopathy, unspecified: Secondary | ICD-10-CM | POA: Diagnosis not present

## 2017-04-07 DIAGNOSIS — I272 Pulmonary hypertension, unspecified: Secondary | ICD-10-CM | POA: Insufficient documentation

## 2017-04-07 LAB — BASIC METABOLIC PANEL
Anion gap: 7 (ref 5–15)
BUN: 30 mg/dL — ABNORMAL HIGH (ref 6–20)
CO2: 26 mmol/L (ref 22–32)
CREATININE: 1.18 mg/dL — AB (ref 0.44–1.00)
Calcium: 9.2 mg/dL (ref 8.9–10.3)
Chloride: 105 mmol/L (ref 101–111)
GFR, EST AFRICAN AMERICAN: 55 mL/min — AB (ref >60)
GFR, EST NON AFRICAN AMERICAN: 47 mL/min — AB (ref >60)
Glucose, Bld: 97 mg/dL (ref 65–99)
Potassium: 4.7 mmol/L (ref 3.5–5.1)
SODIUM: 138 mmol/L (ref 135–145)

## 2017-04-07 NOTE — Progress Notes (Signed)
Patient ID: Vanessa [name], female   DOB: 18-Feb-1951, 66 y.o.   MRN: [id number]    Advanced Heart Failure Clinic Note   PCP: Vanessa [name] Primary Cardiologist: Dr. Radford [name] HF Cardiology: Dr. Aundra [name]  66 yo with history of nonischemic cardiomyopathy presents for CHF clinic evaluation. Patient has had a low EF recognized since at least 9/14, when echo showed EF 40-45%.  In 07-04-23, her child died, which led to significant stress.  She developed worsening exertional dyspnea and subsequent echo showed a fall in EF to 25-30%.  She had left and right heart catheterization in 3/16 with no significant coronary disease detected and moderately elevated right and left heart filling pressures along with pulmonary venous hypertension.  Echo was done 12/16: EF 45%, diffuse hypokinesis. She had a repeat echo 11/17 => EF 45% with moderately dilated LV, moderate MR.    Weight is down 2 lbs. No dyspnea walking at a steady pace on flat ground.  Mild dyspnea dragging trash can to street.  No chest pain.  No orthopnea/PND.  Not lightheaded.  Does aerobic classes on TV for exercise.    Labs (7/16): K 4.5, creatinine 1.12, BNP 596 Labs (8/16): K 4 => 5.1, creatinine 1.29 => 1.5, BNP 388 => 426 Labs (9/16): K 4, creatinine 1.08, BNP 502 Labs (12/16): K 4.9, creatinine 1.57 Labs (5/17): K 5.2, creatinine 1.3 Labs (8/17): BNP 152 Labs (11/17): K 4.9, creatinine 1.32, BNP 236  PMH: 1. Cardiomyopathy: Nonischemic.  Echo (9/14) with EF 40-45%.  Echo (2/16) with EF 25-30%.  Echo (4/16) with EF 30-35%, moderate MR.  Echo (6/16) with EF 40%, moderate LV dilation, moderate diastolic dysfunction, moderate MR, moderate TR, PA systolic pressure 52 mmHg. RHC/LHC (3/16) with no significant CAD, EF 20-25%, 3+ MR; mean RA 13, PA 70/30 mean 45, mean PCWP 31 mmHg, CI 2.2, PVR 3.1 WU.  Echo (12/16) with EF 45%, diffuse hypokinesis, moderate MR, normal RV size and systolic function.  - Echo (11/17): EF 45%, moderately dilated LV, moderate  eccentric posterior MR, PASP 42 mmHg, normal RV size and systolic function.  2. PVCs: Holter (10/15) with 12% PVCs. 3. Pulmonary venous hypertension: CTA chest (7/16) negative for PE. PFTs (7/16) with minimal obstruction, mild restriction, moderately decreased DLCO.  4. Thyroid nodules 5. CKD  SH: Lives alone, only child passed away in 07-04-23, nonsmoker, no ETOH or drugs.   FH: Father with MIs  ROS: All systems reviewed and negative except as per HPI.   Current Outpatient Prescriptions  Medication Sig Dispense Refill  . aspirin 81 MG EC tablet Take 1 tablet (81 mg total) by mouth daily.    Marland Kitchen CALCIUM-VITAMIN D PO Take 1 tablet by mouth daily.    . carvedilol (COREG) 25 MG tablet Take 1 tablet (25 mg total) by mouth 2 (two) times daily with a meal. 180 tablet 2  . furosemide (LASIX) 40 MG tablet Take 40 mg daily, unless weight increases 3lbs within a weeks time then increase to 60 mg BID x 3 days then back to 40 mg BID 90 tablet 6  . ramipril (ALTACE) 5 MG capsule Take 1 capsule (5 mg total) by mouth 2 (two) times daily. 60 capsule 11  . spironolactone (ALDACTONE) 25 MG tablet Take 0.5 tablets (12.5 mg total) by mouth daily. 45 tablet 3   No current facility-administered medications for this encounter.    BP 126/73 (BP Location: Left Arm, Patient Position: Sitting, Cuff Size: Large)   Pulse 67   Ht  5\' 2"  (1.575 m)   Wt 226 lb 6.4 oz (102.7 kg)   BMI 41.41 kg/m    Wt Readings from Last 3 Encounters:  04/07/17 226 lb 6.4 oz (102.7 kg)  10/09/16 228 lb 8 oz (103.6 kg)  04/21/16 223 lb (101.2 kg)    General: Elderly appearing, no acute distress. HEENT: Normal.  Neck: JVP 7 cm, no thyromegaly or lymphadenopathy.   Lungs: clear to auscultation bilaterally.  CV: Nondisplaced PMI.  Heart regular S1/S2, no S3/S4, 2/6 HSM apex.    No carotid bruit.   Abdomen: Soft, NT, ND, no HSM. No bruits or masses. +BS Skin: Intact without lesions or rashes.  Neurologic: Alert and oriented x 3.    Psych: Flat affect. Extremities: No clubbing or cyanosis. Normal pedal pulses.  Trace edema ankles bilaterally.  Assessment/Plan: 1. Chronic systolic CHF: Nonischemic cardiomyopathy.  EF up to 45% on 12/16, repeat echo 11/17 shows that EF remains about 45%.  NYHA class II symptoms.  Etiology of NICM still unclear.  Had frequent PVCs by past holter (12%) but doesn't correlate to her cardiomyopathy. Had improvement of palpitations on Coreg. Cannot rule out myocarditis. May also have component of Takotsubo with passing of child but EF has not improved quite as fast or completely as would be expected with Takotsubo. Have previously discussed cardiac MRI to look for infiltrative disease but she says that she would be too claustrophobic. - Continue Lasix 40 daily, BMET today.   - Continue spironolactone, would not increase with upper normal K in 11/17.  .   - Continue current Coreg and ramipril.   - Repeat echo in 11/18.  2. PVCs: Frequent (12%) on 10/15 holter.   Pt refused repeat holter monitor for further assessment.  - Improved palpitations on Coreg.  Relatively low PVC count out of proportion to cardiomyopathy, unlikely primary etiology. 3. Pulmonary hypertension: Primarily pulmonary venous HTN (PVR 3.1) from elevated left atrial pressure (previous RHC above).   - Best treatment remains adequate diuresis. 4. Mitral regurgitation:  Moderate MR on last echo, she has a prominent murmur on exam.  Repeat echo in 11/18.  5. CKD: BMET today.  Will make sure to get repeat BMET in 3 months given spironolactone use.   Follow up in 6 months with echo.   Vanessa [name]  04/07/2017

## 2017-04-07 NOTE — Patient Instructions (Signed)
Lab today  Lab in 3 months  We will contact you in 6 months to schedule your next appointment and echocardiogram

## 2017-07-08 ENCOUNTER — Ambulatory Visit (HOSPITAL_COMMUNITY)
Admission: RE | Admit: 2017-07-08 | Discharge: 2017-07-08 | Disposition: A | Discharge status: Home / Self Care | Payer: PPO | Source: Ambulatory Visit | Attending: Internal Medicine | Admitting: Internal Medicine

## 2017-07-08 DIAGNOSIS — I5022 Chronic systolic (congestive) heart failure: Secondary | ICD-10-CM | POA: Insufficient documentation

## 2017-07-08 LAB — BASIC METABOLIC PANEL
Anion gap: 6 (ref 5–15)
BUN: 46 mg/dL — AB (ref 6–20)
CHLORIDE: 105 mmol/L (ref 101–111)
CO2: 25 mmol/L (ref 22–32)
CREATININE: 1.93 mg/dL — AB (ref 0.44–1.00)
Calcium: 9.3 mg/dL (ref 8.9–10.3)
GFR calc Af Amer: 30 mL/min — ABNORMAL LOW (ref >60)
GFR calc non Af Amer: 26 mL/min — ABNORMAL LOW (ref >60)
Glucose, Bld: 128 mg/dL — ABNORMAL HIGH (ref 65–99)
POTASSIUM: 4.9 mmol/L (ref 3.5–5.1)
Sodium: 136 mmol/L (ref 135–145)

## 2017-07-09 ENCOUNTER — Telehealth (HOSPITAL_COMMUNITY): Payer: Self-pay | Admitting: *Deleted

## 2017-07-09 DIAGNOSIS — I5022 Chronic systolic (congestive) heart failure: Secondary | ICD-10-CM

## 2017-07-09 MED ORDER — FUROSEMIDE 40 MG PO TABS: 20.0000 mg | ORAL_TABLET | Freq: Every day | ORAL | 6 refills | Status: DC

## 2017-07-09 NOTE — Telephone Encounter (Signed)
Notes recorded by Scarlette Calico, RN on 07/09/2017 at 5:05 PM EDT Pt aware and agreeable, bmet sch for 8/16 ------  Notes recorded by Larey Dresser, MD on 07/08/2017 at 9:35 PM EDT Hold Lasix 2 days then decrease to 20 mg daily. BMET 1 week.

## 2017-07-15 ENCOUNTER — Ambulatory Visit (HOSPITAL_COMMUNITY)
Admission: RE | Admit: 2017-07-15 | Discharge: 2017-07-15 | Disposition: A | Discharge status: Home / Self Care | Payer: PPO | Source: Ambulatory Visit | Attending: Internal Medicine | Admitting: Internal Medicine

## 2017-07-15 DIAGNOSIS — I5022 Chronic systolic (congestive) heart failure: Secondary | ICD-10-CM

## 2017-07-15 LAB — BASIC METABOLIC PANEL
ANION GAP: 6 (ref 5–15)
BUN: 32 mg/dL — ABNORMAL HIGH (ref 6–20)
CO2: 25 mmol/L (ref 22–32)
Calcium: 8.8 mg/dL — ABNORMAL LOW (ref 8.9–10.3)
Chloride: 108 mmol/L (ref 101–111)
Creatinine, Ser: 1.57 mg/dL — ABNORMAL HIGH (ref 0.44–1.00)
GFR calc Af Amer: 39 mL/min — ABNORMAL LOW (ref >60)
GFR, EST NON AFRICAN AMERICAN: 34 mL/min — AB (ref >60)
Glucose, Bld: 129 mg/dL — ABNORMAL HIGH (ref 65–99)
POTASSIUM: 4.6 mmol/L (ref 3.5–5.1)
Sodium: 139 mmol/L (ref 135–145)

## 2017-07-20 ENCOUNTER — Telehealth (HOSPITAL_COMMUNITY): Payer: Self-pay

## 2017-07-20 MED ORDER — CARVEDILOL 25 MG PO TABS: 25.0000 mg | ORAL_TABLET | Freq: Two times a day (BID) | ORAL | 2 refills | Status: DC

## 2017-07-20 NOTE — Telephone Encounter (Signed)
Patient calling to request refill for coreg.  Rx sent to preferred pharmacy electronically.  Renee Pain, RN

## 2017-07-27 DIAGNOSIS — I2721 Secondary pulmonary arterial hypertension: Secondary | ICD-10-CM | POA: Diagnosis not present

## 2017-07-27 DIAGNOSIS — I429 Cardiomyopathy, unspecified: Secondary | ICD-10-CM | POA: Diagnosis not present

## 2017-07-27 DIAGNOSIS — Z136 Encounter for screening for cardiovascular disorders: Secondary | ICD-10-CM | POA: Diagnosis not present

## 2017-07-27 DIAGNOSIS — I119 Hypertensive heart disease without heart failure: Secondary | ICD-10-CM | POA: Diagnosis not present

## 2017-07-27 DIAGNOSIS — Z23 Encounter for immunization: Secondary | ICD-10-CM | POA: Diagnosis not present

## 2017-07-27 DIAGNOSIS — Z13220 Encounter for screening for lipoid disorders: Secondary | ICD-10-CM | POA: Diagnosis not present

## 2017-07-27 DIAGNOSIS — I272 Pulmonary hypertension, unspecified: Secondary | ICD-10-CM | POA: Diagnosis not present

## 2017-07-27 DIAGNOSIS — I509 Heart failure, unspecified: Secondary | ICD-10-CM | POA: Diagnosis not present

## 2017-07-27 DIAGNOSIS — Z0000 Encounter for general adult medical examination without abnormal findings: Secondary | ICD-10-CM | POA: Diagnosis not present

## 2017-09-20 DIAGNOSIS — M8588 Other specified disorders of bone density and structure, other site: Secondary | ICD-10-CM | POA: Diagnosis not present

## 2017-10-15 ENCOUNTER — Ambulatory Visit (HOSPITAL_BASED_OUTPATIENT_CLINIC_OR_DEPARTMENT_OTHER)
Admission: RE | Admit: 2017-10-15 | Discharge: 2017-10-15 | Disposition: A | Discharge status: Home / Self Care | Payer: PPO | Source: Ambulatory Visit | Attending: Cardiology | Admitting: Cardiology

## 2017-10-15 ENCOUNTER — Ambulatory Visit (HOSPITAL_COMMUNITY)
Admission: RE | Admit: 2017-10-15 | Discharge: 2017-10-15 | Disposition: A | Discharge status: Home / Self Care | Payer: PPO | Source: Ambulatory Visit | Attending: Cardiology | Admitting: Cardiology

## 2017-10-15 ENCOUNTER — Encounter (HOSPITAL_COMMUNITY): Payer: Self-pay | Admitting: Cardiology

## 2017-10-15 VITALS — BP 136/88 | HR 78 | Wt 223.0 lb

## 2017-10-15 DIAGNOSIS — I493 Ventricular premature depolarization: Secondary | ICD-10-CM

## 2017-10-15 DIAGNOSIS — I340 Nonrheumatic mitral (valve) insufficiency: Secondary | ICD-10-CM | POA: Insufficient documentation

## 2017-10-15 DIAGNOSIS — I5022 Chronic systolic (congestive) heart failure: Secondary | ICD-10-CM

## 2017-10-15 DIAGNOSIS — I272 Pulmonary hypertension, unspecified: Secondary | ICD-10-CM | POA: Diagnosis not present

## 2017-10-15 LAB — ECHOCARDIOGRAM COMPLETE
E/e' ratio: 11.51
EWDT: 166 ms
FS: 14 % — AB (ref 28–44)
IVS/LV PW RATIO, ED: 0.81
LA ID, A-P, ES: 47 mm
LA diam end sys: 47 mm
LA diam index: 2.16 cm/m2
LA vol A4C: 92.4 ml
LA vol: 94.2 mL
LAVOLIN: 43.3 mL/m2
LV E/e' medial: 11.51
LV E/e'average: 11.51
LV PW d: 12.5 mm — AB (ref 0.6–1.1)
LV TDI E'LATERAL: 11.9
LV TDI E'MEDIAL: 6.64
LV e' LATERAL: 11.9 cm/s
LVOT SV: 58 mL
LVOT area: 2.84 cm2
LVOTD: 19 mm
Lateral S' vel: 10.4 cm/s
MV Dec: 166
MV pk A vel: 132 m/s
MV pk E vel: 137 m/s
MVPG: 8 mmHg
PISA EROA: 0.12 cm2
RV sys press: 50 mmHg
Reg peak vel: 341 cm/s
TAPSE: 23.9 mm
TR max vel: 341 cm/s
VTI: 180 cm

## 2017-10-15 LAB — BASIC METABOLIC PANEL
ANION GAP: 6 (ref 5–15)
BUN: 13 mg/dL (ref 6–20)
CHLORIDE: 107 mmol/L (ref 101–111)
CO2: 26 mmol/L (ref 22–32)
Calcium: 9 mg/dL (ref 8.9–10.3)
Creatinine, Ser: 1.11 mg/dL — ABNORMAL HIGH (ref 0.44–1.00)
GFR calc non Af Amer: 51 mL/min — ABNORMAL LOW (ref >60)
GFR, EST AFRICAN AMERICAN: 59 mL/min — AB (ref >60)
Glucose, Bld: 110 mg/dL — ABNORMAL HIGH (ref 65–99)
Potassium: 4.7 mmol/L (ref 3.5–5.1)
Sodium: 139 mmol/L (ref 135–145)

## 2017-10-15 LAB — BRAIN NATRIURETIC PEPTIDE: B NATRIURETIC PEPTIDE 5: 1044.5 pg/mL — AB (ref 0.0–100.0)

## 2017-10-15 MED ORDER — SPIRONOLACTONE 25 MG PO TABS: 25.0000 mg | ORAL_TABLET | Freq: Every day | ORAL | 3 refills | Status: DC

## 2017-10-15 MED ORDER — FUROSEMIDE 40 MG PO TABS: 40.0000 mg | ORAL_TABLET | Freq: Every day | ORAL | 6 refills | Status: DC

## 2017-10-15 NOTE — Patient Instructions (Signed)
Increase Furosemide 40 mg (1 tab) daily  Increase Spironolactone 25 mg (1 tab) daily  Labs drawn today (if we do not call you, then your lab work was stable)   Your physician recommends that you return for lab work in: 10 days   Your physician recommends that you schedule a follow-up appointment in: 3 months Dr. Aundra Dubin

## 2017-10-15 NOTE — Progress Notes (Signed)
  Echocardiogram 2D Echocardiogram has been performed.  Vanessa [name] 10/15/2017, 10:51 AM

## 2017-10-16 NOTE — Progress Notes (Signed)
Patient ID: Vanessa [name], female   DOB: [birth year], 66 y.o.   MRN: [id number]    Advanced Heart Failure Clinic Note   PCP: Lennie Odor Primary Cardiologist: Dr. Radford Pax HF Cardiology: Dr. Aundra Dubin  66 y.o. with history of nonischemic cardiomyopathy presents for followup of CHF. Patient has had a low EF recognized since at least 9/14, when echo showed EF 40-45%.  In 07-10-2023, her child died, which led to significant stress.  She developed worsening exertional dyspnea and subsequent echo showed a fall in EF to 25-30%.  She had left and right heart catheterization in 3/16 with no significant coronary disease detected and moderately elevated right and left heart filling pressures along with pulmonary venous hypertension.  Echo was done 12/16: EF 45%, diffuse hypokinesis. She had a repeat echo 11/17 => EF 45% with moderately dilated LV, moderate MR.    Repeat echo done today was reviewed and shows EF stable at 45% with mild LV dilation and mildly decreased RV systolic function, moderate MR.   Weight is down 3 lbs.  She has mild dyspnea walking to the mailbox and back.  Mild dyspnea with stairs.  No chest pain.  Rare palpitations.  No orthopnea/PND.  No lightheadedness/syncope.   Labs (7/16): K 4.5, creatinine 1.12, BNP 596 Labs (8/16): K 4 => 5.1, creatinine 1.29 => 1.5, BNP 388 => 426 Labs (9/16): K 4, creatinine 1.08, BNP 502 Labs (12/16): K 4.9, creatinine 1.57 Labs (5/17): K 5.2, creatinine 1.3 Labs (8/17): BNP 152 Labs (11/17): K 4.9, creatinine 1.32, BNP 236 Labs (8/18): K 4.6, creatinine 1.57  ECG (11/18): NSR, inferior and anterolateral TWIs  PMH: 1. Cardiomyopathy: Nonischemic.  Echo (9/14) with EF 40-45%.  Echo (2/16) with EF 25-30%.  Echo (4/16) with EF 30-35%, moderate MR.  Echo (6/16) with EF 40%, moderate LV dilation, moderate diastolic dysfunction, moderate MR, moderate TR, PA systolic pressure 52 mmHg. RHC/LHC (3/16) with no significant CAD, EF 20-25%, 3+ MR; mean RA 13, PA 70/30  mean 45, mean PCWP 31 mmHg, CI 2.2, PVR 3.1 WU.  Echo (12/16) with EF 45%, diffuse hypokinesis, moderate MR, normal RV size and systolic function.  - Echo (11/17): EF 45%, moderately dilated LV, moderate eccentric posterior MR, PASP 42 mmHg, normal RV size and systolic function.  - Echo (11/18): EF 45%, mild LV dilation, moderate diastolic dysfunction, normal RV size with mildly decreased systolic function, PASP 50 mmHg, moderate MR.  2. PVCs: Holter (10/15) with 12% PVCs. 3. Pulmonary venous hypertension: CTA chest (7/16) negative for PE. PFTs (7/16) with minimal obstruction, mild restriction, moderately decreased DLCO.  4. Thyroid nodules: Biopsy benign.  5. CKD stage 3  SH: Lives alone, only child passed away in 07/10/2023, nonsmoker, no ETOH or drugs.   FH: Father with MIs  ROS: All systems reviewed and negative except as per HPI.   Current Outpatient Medications  Medication Sig Dispense Refill  . aspirin 81 MG EC tablet Take 1 tablet (81 mg total) by mouth daily.    Marland Kitchen CALCIUM-VITAMIN D PO Take 1 tablet by mouth daily.    . carvedilol (COREG) 25 MG tablet Take 1 tablet (25 mg total) by mouth 2 (two) times daily with a meal. 180 tablet 2  . furosemide (LASIX) 40 MG tablet Take 1 tablet (40 mg total) daily by mouth. 90 tablet 6  . ibandronate (BONIVA) 150 MG tablet Take 150 mg every 30 (thirty) days by mouth. Take in the morning with a full glass of water, on an  empty stomach, and do not take anything else by mouth or lie down for the next 30 min.    . ramipril (ALTACE) 5 MG capsule Take 1 capsule (5 mg total) by mouth 2 (two) times daily. 60 capsule 11  . spironolactone (ALDACTONE) 25 MG tablet Take 1 tablet (25 mg total) daily by mouth. 30 tablet 3   No current facility-administered medications for this encounter.    BP 136/88 (BP Location: Right Wrist, Patient Position: Sitting, Cuff Size: Normal)   Pulse 78   Wt 223 lb (101.2 kg)   SpO2 98%   BMI 40.79 kg/m    Wt Readings from Last 3  Encounters:  10/15/17 223 lb (101.2 kg)  04/07/17 226 lb 6.4 oz (102.7 kg)  10/09/16 228 lb 8 oz (103.6 kg)    General: NAD Neck: JVP 9-10 cm, left thyroid mass  Lungs: Clear to auscultation bilaterally with normal respiratory effort. CV: Nondisplaced PMI.  Heart regular S1/S2, no S3/S4, 2/6 HSM apex.  No peripheral edema.  No carotid bruit.  Normal pedal pulses.  Abdomen: Soft, nontender, no hepatosplenomegaly, no distention.  Skin: Intact without lesions or rashes.  Neurologic: Alert and oriented x 3.  Psych: Normal affect. Extremities: No clubbing or cyanosis.  HEENT: Normal.   Assessment/Plan: 1. Chronic systolic CHF: Nonischemic cardiomyopathy.  EF up to 45% on 12/16, repeat echo 11/18 shows that EF remains about 45%.  Etiology of NICM still unclear.  Had frequent PVCs by past holter (12%) but doesn't correlate to her cardiomyopathy. Had improvement of palpitations on Coreg. Cannot rule out myocarditis. May also have component of Takotsubo with passing of child but EF has not improved quite as fast or completely as would be expected with Takotsubo. Have previously discussed cardiac MRI to look for infiltrative disease but she says that she would be too claustrophobic.  NYHA class II-III symptoms. On exam today she is at least mildly volume overloaded.  - Increase Lasix to 40 mg daily.    - Increase spironolactone to 25 mg daily.   - Continue current Coreg and ramipril.   - BMET/BNP today and repeat BMET in 10 days.   2. PVCs: Frequent (12%) on 10/15 holter.   Pt refused repeat holter monitor for further assessment. PVC count was probably not high enough to cause cardiomyopathy, unlikely primary etiology. - Improved palpitations on Coreg.   3. Pulmonary hypertension: Primarily pulmonary venous HTN (PVR 3.1) from elevated left atrial pressure (previous RHC above).   - Best treatment remains adequate diuresis. 4. Mitral regurgitation:  Moderate MR on today's echo, she has a prominent  murmur on exam.   5. CKD: BMET today.    Follow up in 3 months   [name]  10/16/2017

## 2017-10-18 ENCOUNTER — Other Ambulatory Visit (HOSPITAL_COMMUNITY): Payer: Self-pay | Admitting: *Deleted

## 2017-10-19 ENCOUNTER — Other Ambulatory Visit (HOSPITAL_COMMUNITY): Payer: Self-pay | Admitting: *Deleted

## 2017-10-19 MED ORDER — FUROSEMIDE 40 MG PO TABS: 40.0000 mg | ORAL_TABLET | Freq: Every day | ORAL | 6 refills | Status: DC

## 2017-10-26 ENCOUNTER — Ambulatory Visit (HOSPITAL_COMMUNITY)
Admission: RE | Admit: 2017-10-26 | Discharge: 2017-10-26 | Disposition: A | Discharge status: Home / Self Care | Payer: PPO | Source: Ambulatory Visit | Attending: Internal Medicine | Admitting: Internal Medicine

## 2017-10-26 DIAGNOSIS — I5022 Chronic systolic (congestive) heart failure: Secondary | ICD-10-CM | POA: Diagnosis not present

## 2017-10-26 LAB — BASIC METABOLIC PANEL
Anion gap: 7 (ref 5–15)
BUN: 26 mg/dL — AB (ref 6–20)
CALCIUM: 8.7 mg/dL — AB (ref 8.9–10.3)
CO2: 27 mmol/L (ref 22–32)
CREATININE: 1.29 mg/dL — AB (ref 0.44–1.00)
Chloride: 106 mmol/L (ref 101–111)
GFR calc non Af Amer: 42 mL/min — ABNORMAL LOW (ref >60)
GFR, EST AFRICAN AMERICAN: 49 mL/min — AB (ref >60)
Glucose, Bld: 125 mg/dL — ABNORMAL HIGH (ref 65–99)
Potassium: 3.8 mmol/L (ref 3.5–5.1)
SODIUM: 140 mmol/L (ref 135–145)

## 2017-11-01 ENCOUNTER — Other Ambulatory Visit (HOSPITAL_COMMUNITY): Payer: Self-pay | Admitting: Cardiology

## 2018-01-17 ENCOUNTER — Other Ambulatory Visit: Payer: Self-pay | Admitting: Physician Assistant

## 2018-01-17 DIAGNOSIS — Z1231 Encounter for screening mammogram for malignant neoplasm of breast: Secondary | ICD-10-CM

## 2018-01-18 ENCOUNTER — Ambulatory Visit (HOSPITAL_COMMUNITY)
Admission: RE | Admit: 2018-01-18 | Discharge: 2018-01-18 | Disposition: A | Discharge status: Home / Self Care | Payer: PPO | Source: Ambulatory Visit | Attending: Cardiology | Admitting: Cardiology

## 2018-01-18 ENCOUNTER — Encounter (HOSPITAL_COMMUNITY): Payer: Self-pay | Admitting: Cardiology

## 2018-01-18 VITALS — HR 77 | Wt 212.4 lb

## 2018-01-18 DIAGNOSIS — I130 Hypertensive heart and chronic kidney disease with heart failure and stage 1 through stage 4 chronic kidney disease, or unspecified chronic kidney disease: Secondary | ICD-10-CM | POA: Insufficient documentation

## 2018-01-18 DIAGNOSIS — I5022 Chronic systolic (congestive) heart failure: Secondary | ICD-10-CM | POA: Insufficient documentation

## 2018-01-18 DIAGNOSIS — N183 Chronic kidney disease, stage 3 (moderate): Secondary | ICD-10-CM | POA: Insufficient documentation

## 2018-01-18 DIAGNOSIS — Z7982 Long term (current) use of aspirin: Secondary | ICD-10-CM | POA: Insufficient documentation

## 2018-01-18 DIAGNOSIS — I340 Nonrheumatic mitral (valve) insufficiency: Secondary | ICD-10-CM | POA: Insufficient documentation

## 2018-01-18 DIAGNOSIS — Z79899 Other long term (current) drug therapy: Secondary | ICD-10-CM | POA: Diagnosis not present

## 2018-01-18 DIAGNOSIS — I428 Other cardiomyopathies: Secondary | ICD-10-CM | POA: Diagnosis not present

## 2018-01-18 DIAGNOSIS — I493 Ventricular premature depolarization: Secondary | ICD-10-CM | POA: Diagnosis not present

## 2018-01-18 LAB — BASIC METABOLIC PANEL
ANION GAP: 9 (ref 5–15)
BUN: 42 mg/dL — ABNORMAL HIGH (ref 6–20)
CALCIUM: 8.7 mg/dL — AB (ref 8.9–10.3)
CO2: 19 mmol/L — ABNORMAL LOW (ref 22–32)
Chloride: 109 mmol/L (ref 101–111)
Creatinine, Ser: 1.43 mg/dL — ABNORMAL HIGH (ref 0.44–1.00)
GFR, EST AFRICAN AMERICAN: 43 mL/min — AB (ref >60)
GFR, EST NON AFRICAN AMERICAN: 37 mL/min — AB (ref >60)
Glucose, Bld: 148 mg/dL — ABNORMAL HIGH (ref 65–99)
POTASSIUM: 5.6 mmol/L — AB (ref 3.5–5.1)
SODIUM: 137 mmol/L (ref 135–145)

## 2018-01-18 NOTE — Patient Instructions (Signed)
Labs drawn today (if we do not call you, then your lab work was stable)   Your physician recommends that you schedule a follow-up appointment in: 4 months with Dr. McLean    

## 2018-01-18 NOTE — Progress Notes (Signed)
Patient ID: Vanessa [name], female   DOB: [birth year], 67 y.o.   MRN: [id number]    Advanced Heart Failure Clinic Note   PCP: Lennie Odor Primary Cardiologist: Dr. Radford Pax HF Cardiology: Dr. Aundra Dubin  67 y.o. with history of nonischemic cardiomyopathy presents for followup of CHF. Patient has had a low EF recognized since at least 9/14, when echo showed EF 40-45%.  In Jul 07, 2023, her child died, which led to significant stress.  She developed worsening exertional dyspnea and subsequent echo showed a fall in EF to 25-30%.  She had left and right heart catheterization in 3/16 with no significant coronary disease detected and moderately elevated right and left heart filling pressures along with pulmonary venous hypertension.  Echo was done 12/16: EF 45%, diffuse hypokinesis. She had a repeat echo 11/17 => EF 45% with moderately dilated LV, moderate MR.  Echo 11/18 showed EF stable at 45% with mild LV dilation and mildly decreased RV systolic function, moderate MR.   She returns for followup of CHF.  No significant exertional dyspnea.  She gets fatigued walking up stairs.  No orthopnea/PND.  No chest pain.  Weight is down 11 lbs. She has been following a diet with women at her church. Very rare palpitations now.   Labs (7/16): K 4.5, creatinine 1.12, BNP 596 Labs (8/16): K 4 => 5.1, creatinine 1.29 => 1.5, BNP 388 => 426 Labs (9/16): K 4, creatinine 1.08, BNP 502 Labs (12/16): K 4.9, creatinine 1.57 Labs (5/17): K 5.2, creatinine 1.3 Labs (8/17): BNP 152 Labs (11/17): K 4.9, creatinine 1.32, BNP 236 Labs (8/18): K 4.6, creatinine 1.57 Labs (11/18): K 3.8, creatinine 1.29  PMH: 1. Cardiomyopathy: Nonischemic.  Echo (9/14) with EF 40-45%.  Echo (2/16) with EF 25-30%.  Echo (4/16) with EF 30-35%, moderate MR.  Echo (6/16) with EF 40%, moderate LV dilation, moderate diastolic dysfunction, moderate MR, moderate TR, PA systolic pressure 52 mmHg. RHC/LHC (3/16) with no significant CAD, EF 20-25%, 3+ MR; mean RA  13, PA 70/30 mean 45, mean PCWP 31 mmHg, CI 2.2, PVR 3.1 WU.  Echo (12/16) with EF 45%, diffuse hypokinesis, moderate MR, normal RV size and systolic function.  - Echo (11/17): EF 45%, moderately dilated LV, moderate eccentric posterior MR, PASP 42 mmHg, normal RV size and systolic function.  - Echo (11/18): EF 45%, mild LV dilation, moderate diastolic dysfunction, normal RV size with mildly decreased systolic function, PASP 50 mmHg, moderate MR.  2. PVCs: Holter (10/15) with 12% PVCs. 3. Pulmonary venous hypertension: CTA chest (7/16) negative for PE. PFTs (7/16) with minimal obstruction, mild restriction, moderately decreased DLCO.  4. Thyroid nodules: Biopsy benign.  5. CKD stage 3  SH: Lives alone, only child passed away in 07-07-2023, nonsmoker, no ETOH or drugs.   FH: Father with MIs  ROS: 67 y.o. All systems reviewed and negative except as per HPI.   Current Outpatient Medications  Medication Sig Dispense Refill  . aspirin 81 MG EC tablet Take 1 tablet (81 mg total) by mouth daily.    Marland Kitchen CALCIUM-VITAMIN D PO Take 1 tablet by mouth daily.    . carvedilol (COREG) 25 MG tablet Take 1 tablet (25 mg total) by mouth 2 (two) times daily with a meal. 180 tablet 2  . furosemide (LASIX) 40 MG tablet Take 1 tablet (40 mg total) by mouth daily. 90 tablet 6  . ibandronate (BONIVA) 150 MG tablet Take 150 mg every 30 (thirty) days by mouth. Take in the morning with a full glass of  water, on an empty stomach, and do not take anything else by mouth or lie down for the next 30 min.    . ramipril (ALTACE) 5 MG capsule Take 1 capsule (5 mg total) by mouth 2 (two) times daily. 60 capsule 11  . spironolactone (ALDACTONE) 25 MG tablet Take 1 tablet (25 mg total) by mouth daily. 90 tablet 3   67 No current facility-administered medications for this encounter.    Pulse 77   Wt 212 lb 6.4 oz (96.3 kg)   SpO2 100%   BMI 38.85 kg/m    Wt Readings from Last 3 Encounters:  01/18/18 212 lb 6.4 oz (96.3 kg)  10/15/17 223 lb  (101.2 kg)  04/07/17 226 lb 6.4 oz (102.7 kg)   General: NAD, overweight Neck: No JVD, no thyromegaly or thyroid nodule.  Lungs: Clear to auscultation bilaterally with normal respiratory effort. CV: Nondisplaced PMI.  Heart regular S1/S2, no S3/S4, 2/6 HSM apex. Trace ankle edema.  No carotid bruit.  Normal pedal pulses.  Abdomen: Soft, nontender, no hepatosplenomegaly, no distention.  Skin: Intact without lesions or rashes.  Neurologic: Alert and oriented x 3.  Psych: Normal affect. Extremities: No clubbing or cyanosis.  HEENT: Normal.   Assessment/Plan: 1. Chronic systolic CHF: Nonischemic cardiomyopathy.  EF up to 45% on 12/16, repeat echo 11/18 shows that EF remains about 45%.  Etiology of NICM still unclear.  Had frequent PVCs by past holter (12%) but doesn't correlate to her cardiomyopathy. Had improvement of palpitations on Coreg. Cannot rule out myocarditis. May also have component of Takotsubo with passing of child but EF has not improved as fast or completely as would be expected with Takotsubo. Have previously discussed cardiac MRI to look for infiltrative disease but she says that she would be too claustrophobic.  NYHA class II symptoms. She is not volume overloaded on exam today.   - Continue Lasix 40 mg daily.   BMET today.  - Continue spironolactone 25 mg daily.   - Continue current Coreg and ramipril.   2. PVCs: Frequent (12%) on 10/15 holter.   Pt refused repeat holter monitor for further assessment. PVC count was probably not high enough to cause cardiomyopathy, unlikely primary etiology. - Improved palpitations on Coreg.   3. Pulmonary hypertension: Primarily pulmonary venous HTN (PVR 3.1) from elevated left atrial pressure (previous RHC above).   - Best treatment remains adequate diuresis. 4. Mitral regurgitation:  Moderate MR on 11/18 echo, she has a prominent murmur on exam.    Follow up in 4 months   [name]  01/18/2018

## 2018-01-19 ENCOUNTER — Telehealth (HOSPITAL_COMMUNITY): Payer: Self-pay | Admitting: Cardiology

## 2018-01-19 DIAGNOSIS — I5022 Chronic systolic (congestive) heart failure: Secondary | ICD-10-CM

## 2018-01-19 NOTE — Telephone Encounter (Signed)
Patient aware. Patient will hold spiro x 1 dose and return for labs 01/20/18

## 2018-01-19 NOTE — Telephone Encounter (Signed)
-----   Message from Larey Dresser, MD sent at 01/18/2018  3:24 PM EST ----- Hemolyzed, hold any K supplement for now and repeat BMET stat.

## 2018-01-20 ENCOUNTER — Ambulatory Visit (HOSPITAL_COMMUNITY)
Admission: RE | Admit: 2018-01-20 | Discharge: 2018-01-20 | Disposition: A | Discharge status: Home / Self Care | Payer: PPO | Source: Ambulatory Visit | Attending: Cardiology | Admitting: Cardiology

## 2018-01-20 DIAGNOSIS — I5022 Chronic systolic (congestive) heart failure: Secondary | ICD-10-CM | POA: Insufficient documentation

## 2018-01-20 LAB — BASIC METABOLIC PANEL
ANION GAP: 7 (ref 5–15)
BUN: 37 mg/dL — AB (ref 6–20)
CHLORIDE: 112 mmol/L — AB (ref 101–111)
CO2: 20 mmol/L — ABNORMAL LOW (ref 22–32)
Calcium: 8.7 mg/dL — ABNORMAL LOW (ref 8.9–10.3)
Creatinine, Ser: 1.44 mg/dL — ABNORMAL HIGH (ref 0.44–1.00)
GFR calc non Af Amer: 37 mL/min — ABNORMAL LOW (ref >60)
GFR, EST AFRICAN AMERICAN: 43 mL/min — AB (ref >60)
Glucose, Bld: 155 mg/dL — ABNORMAL HIGH (ref 65–99)
POTASSIUM: 5.1 mmol/L (ref 3.5–5.1)
SODIUM: 139 mmol/L (ref 135–145)

## 2018-02-22 ENCOUNTER — Other Ambulatory Visit (HOSPITAL_COMMUNITY): Payer: Self-pay | Admitting: *Deleted

## 2018-02-22 MED ORDER — RAMIPRIL 5 MG PO CAPS: 5.0000 mg | ORAL_CAPSULE | Freq: Two times a day (BID) | ORAL | 11 refills | Status: DC

## 2018-02-25 ENCOUNTER — Ambulatory Visit
Admission: RE | Admit: 2018-02-25 | Discharge: 2018-02-25 | Disposition: A | Discharge status: Home / Self Care | Payer: PPO | Source: Ambulatory Visit | Attending: Physician Assistant | Admitting: Physician Assistant

## 2018-02-25 DIAGNOSIS — Z1231 Encounter for screening mammogram for malignant neoplasm of breast: Secondary | ICD-10-CM | POA: Diagnosis not present

## 2018-03-07 ENCOUNTER — Other Ambulatory Visit (HOSPITAL_COMMUNITY): Payer: Self-pay | Admitting: *Deleted

## 2018-03-07 MED ORDER — SPIRONOLACTONE 25 MG PO TABS: 25.0000 mg | ORAL_TABLET | Freq: Every day | ORAL | 3 refills | Status: DC

## 2018-05-03 ENCOUNTER — Other Ambulatory Visit (HOSPITAL_COMMUNITY): Payer: Self-pay | Admitting: *Deleted

## 2018-05-03 MED ORDER — CARVEDILOL 25 MG PO TABS: 25.0000 mg | ORAL_TABLET | Freq: Two times a day (BID) | ORAL | 2 refills | Status: DC

## 2018-05-19 ENCOUNTER — Ambulatory Visit (HOSPITAL_COMMUNITY)
Admission: RE | Admit: 2018-05-19 | Discharge: 2018-05-19 | Disposition: A | Discharge status: Home / Self Care | Payer: PPO | Source: Ambulatory Visit | Attending: Cardiology | Admitting: Cardiology

## 2018-05-19 VITALS — BP 100/72 | HR 75 | Wt 208.4 lb

## 2018-05-19 DIAGNOSIS — I5022 Chronic systolic (congestive) heart failure: Secondary | ICD-10-CM | POA: Diagnosis not present

## 2018-05-19 DIAGNOSIS — N183 Chronic kidney disease, stage 3 (moderate): Secondary | ICD-10-CM | POA: Diagnosis not present

## 2018-05-19 DIAGNOSIS — Z79899 Other long term (current) drug therapy: Secondary | ICD-10-CM | POA: Insufficient documentation

## 2018-05-19 DIAGNOSIS — I272 Pulmonary hypertension, unspecified: Secondary | ICD-10-CM | POA: Diagnosis not present

## 2018-05-19 DIAGNOSIS — Z7982 Long term (current) use of aspirin: Secondary | ICD-10-CM | POA: Insufficient documentation

## 2018-05-19 DIAGNOSIS — I429 Cardiomyopathy, unspecified: Secondary | ICD-10-CM | POA: Insufficient documentation

## 2018-05-19 DIAGNOSIS — I340 Nonrheumatic mitral (valve) insufficiency: Secondary | ICD-10-CM | POA: Diagnosis not present

## 2018-05-19 DIAGNOSIS — I493 Ventricular premature depolarization: Secondary | ICD-10-CM | POA: Diagnosis not present

## 2018-05-19 LAB — BASIC METABOLIC PANEL
Anion gap: 6 (ref 5–15)
BUN: 51 mg/dL — AB (ref 6–20)
CALCIUM: 9.4 mg/dL (ref 8.9–10.3)
CO2: 24 mmol/L (ref 22–32)
CREATININE: 1.96 mg/dL — AB (ref 0.44–1.00)
Chloride: 108 mmol/L (ref 101–111)
GFR calc non Af Amer: 25 mL/min — ABNORMAL LOW (ref >60)
GFR, EST AFRICAN AMERICAN: 29 mL/min — AB (ref >60)
Glucose, Bld: 109 mg/dL — ABNORMAL HIGH (ref 65–99)
Potassium: 5.6 mmol/L — ABNORMAL HIGH (ref 3.5–5.1)
Sodium: 138 mmol/L (ref 135–145)

## 2018-05-19 LAB — TSH: TSH: 0.316 u[IU]/mL — AB (ref 0.350–4.500)

## 2018-05-19 NOTE — Patient Instructions (Signed)
Labs today  Your physician recommends that you schedule a follow-up appointment in: November with echocardiogram

## 2018-05-19 NOTE — Progress Notes (Signed)
Patient ID: Vanessa [name], female   DOB: October 27, 1951, 67 y.o.   MRN: [id number]    Advanced Heart Failure Clinic Note   PCP: Lennie Odor Primary Cardiologist: Dr. Radford Pax HF Cardiology: Dr. Aundra Dubin  67 y.o. with history of nonischemic cardiomyopathy presents for followup of CHF. Patient has had a low EF recognized since at least 9/14, when echo showed EF 40-45%.  In 07/05/2023, her child died, which led to significant stress.  She developed worsening exertional dyspnea and subsequent echo showed a fall in EF to 25-30%.  She had left and right heart catheterization in 3/16 with no significant coronary disease detected and moderately elevated right and left heart filling pressures along with pulmonary venous hypertension.  Echo was done 12/16: EF 45%, diffuse hypokinesis. She had a repeat echo 11/17 => EF 45% with moderately dilated LV, moderate MR.  Echo 11/18 showed EF stable at 45% with mild LV dilation and mildly decreased RV systolic function, moderate MR.   She returns for followup of CHF.  Rare palpitations.  Weight is down 4 lbs.  No exertional dyspnea or chest pain.  No orthopnea/PND.  No lightheadedness.    Labs (7/16): K 4.5, creatinine 1.12, BNP 596 Labs (8/16): K 4 => 5.1, creatinine 1.29 => 1.5, BNP 388 => 426 Labs (9/16): K 4, creatinine 1.08, BNP 502 Labs (12/16): K 4.9, creatinine 1.57 Labs (5/17): K 5.2, creatinine 1.3 Labs (8/17): BNP 152 Labs (11/17): K 4.9, creatinine 1.32, BNP 236 Labs (8/18): K 4.6, creatinine 1.57 Labs (11/18): K 3.8, creatinine 1.29 Labs (2/19): K 5.1, creatinine 1.44  PMH: 1. Cardiomyopathy: Nonischemic.  Echo (9/14) with EF 40-45%.  Echo (2/16) with EF 25-30%.  Echo (4/16) with EF 30-35%, moderate MR.  Echo (6/16) with EF 40%, moderate LV dilation, moderate diastolic dysfunction, moderate MR, moderate TR, PA systolic pressure 52 mmHg. RHC/LHC (3/16) with no significant CAD, EF 20-25%, 3+ MR; mean RA 13, PA 70/30 mean 45, mean PCWP 31 mmHg, CI 2.2, PVR 3.1  WU.  Echo (12/16) with EF 45%, diffuse hypokinesis, moderate MR, normal RV size and systolic function.  - Echo (11/17): EF 45%, moderately dilated LV, moderate eccentric posterior MR, PASP 42 mmHg, normal RV size and systolic function.  - Echo (11/18): EF 45%, mild LV dilation, moderate diastolic dysfunction, normal RV size with mildly decreased systolic function, PASP 50 mmHg, moderate MR.  2. PVCs: Holter (10/15) with 12% PVCs. 3. Pulmonary venous hypertension: CTA chest (7/16) negative for PE. PFTs (7/16) with minimal obstruction, mild restriction, moderately decreased DLCO.  4. Thyroid nodules: Biopsy benign.  5. CKD stage 3  SH: Lives alone, only child passed away in 07/05/2023, nonsmoker, no ETOH or drugs.   FH: Father with MIs  ROS: All systems reviewed and negative except as per HPI.   Current Outpatient Medications  Medication Sig Dispense Refill  . aspirin 81 MG EC tablet Take 1 tablet (81 mg total) by mouth daily.    Marland Kitchen CALCIUM-VITAMIN D PO Take 1 tablet by mouth daily.    . carvedilol (COREG) 25 MG tablet Take 1 tablet (25 mg total) by mouth 2 (two) times daily with a meal. 180 tablet 2  . furosemide (LASIX) 40 MG tablet Take 1 tablet (40 mg total) by mouth daily. 90 tablet 6  . ibandronate (BONIVA) 150 MG tablet Take 150 mg every 30 (thirty) days by mouth. Take in the morning with a full glass of water, on an empty stomach, and do not take anything else  by mouth or lie down for the next 30 min.    . ramipril (ALTACE) 5 MG capsule Take 1 capsule (5 mg total) by mouth 2 (two) times daily. 60 capsule 11  . spironolactone (ALDACTONE) 25 MG tablet Take 1 tablet (25 mg total) by mouth daily. 90 tablet 3   No current facility-administered medications for this encounter.    BP 100/72   Pulse 75   Wt 208 lb 6.4 oz (94.5 kg)   SpO2 97%   BMI 38.12 kg/m    Wt Readings from Last 3 Encounters:  05/19/18 208 lb 6.4 oz (94.5 kg)  01/18/18 212 lb 6.4 oz (96.3 kg)  10/15/17 223 lb (101.2 kg)    General: NAD Neck: No JVD, diffusely enlarged thyroid.   Lungs: Clear to auscultation bilaterally with normal respiratory effort. CV: Nondisplaced PMI.  Heart regular S1/S2, no S3/S4, 2/6 HSM apex.  No peripheral edema.  No carotid bruit.  Normal pedal pulses.  Abdomen: Soft, nontender, no hepatosplenomegaly, no distention.  Skin: Intact without lesions or rashes.  Neurologic: Alert and oriented x 3.  Psych: Normal affect. Extremities: No clubbing or cyanosis.  HEENT: Normal.   Assessment/Plan: 1. Chronic systolic CHF: Nonischemic cardiomyopathy.  EF up to 45% on 12/16, repeat echo 11/18 shows that EF remains about 45%.  Etiology of NICM still unclear. Had frequent PVCs by past holter (12%) but doesn't explain her cardiomyopathy. Had improvement of palpitations on Coreg. Cannot rule out myocarditis. May also have component of Takotsubo with passing of child but EF has not improved as completely as would be expected with Takotsubo. Have previously discussed cardiac MRI to look for infiltrative disease but she says that she would be too claustrophobic.  NYHA class II symptoms. She is not volume overloaded on exam today.   - Continue Lasix 40 mg daily.   BMET today.  - Continue spironolactone 25 mg daily.   - Continue current Coreg and ramipril.   - Repeat echo in 11/19.  2. PVCs: Frequent (12%) on 10/15 holter.   Pt refused repeat holter monitor for further assessment. PVC count was probably not high enough to cause cardiomyopathy, unlikely primary etiology. - Improved palpitations on Coreg.   3. Pulmonary hypertension: Primarily pulmonary venous HTN (PVR 3.1) from elevated left atrial pressure (previous RHC above).   - Best treatment remains adequate diuresis. 4. Mitral regurgitation:  Moderate MR on 11/18 echo, MR murmur on exam.  - Echo in 11/19.   Follow up in 11/19 with echo.    [name]  05/19/2018

## 2018-05-23 ENCOUNTER — Telehealth (HOSPITAL_COMMUNITY): Payer: Self-pay | Admitting: *Deleted

## 2018-05-23 DIAGNOSIS — I5022 Chronic systolic (congestive) heart failure: Secondary | ICD-10-CM

## 2018-05-23 MED ORDER — RAMIPRIL 5 MG PO CAPS
5.0000 mg | ORAL_CAPSULE | Freq: Every day | ORAL | 11 refills | Status: DC
Start: 2018-05-23 — End: 2018-05-25

## 2018-05-23 NOTE — Telephone Encounter (Signed)
Result Notes for TSH   Notes recorded by Darron Doom, RN on 05/23/2018 at 8:23 AM EDT Patient called back and she has been scheduled for bmet, tsh, free t3/t4 for tomorrow. MAR updated, appointments scheduled, and lab orders placed. ------  Notes recorded by Cheryln Manly, NP on 05/20/2018 at 6:24 PM EDT Called patient and instructed on medication adjusted as noted. Patient voiced understanding and wrote down information regarding changes. ------  Notes recorded by Shirley Muscat, RN on 05/20/2018 at 4:59 PM EDT Left message to call back ------  Notes recorded by Larey Dresser, MD on 05/20/2018 at 4:48 PM EDT TSH suggests hyperthyroidism. She needs to repeat TSH and have free T3 and free T4 checked. Stop spironolactone for now. Hold Lasix for 2 days then decrease to 20 mg daily. Hold ramipril for 2 days then decrease to 5 mg daily. Needs BMET Tuesday. Will need followup in clinic with NP/PA in 10-14 days to reassess with medication changes.

## 2018-05-24 ENCOUNTER — Ambulatory Visit (HOSPITAL_COMMUNITY)
Admission: RE | Admit: 2018-05-24 | Discharge: 2018-05-24 | Disposition: A | Discharge status: Home / Self Care | Payer: PPO | Source: Ambulatory Visit | Attending: Internal Medicine | Admitting: Internal Medicine

## 2018-05-24 DIAGNOSIS — I5022 Chronic systolic (congestive) heart failure: Secondary | ICD-10-CM

## 2018-05-24 LAB — BASIC METABOLIC PANEL
Anion gap: 6 (ref 5–15)
BUN: 34 mg/dL — AB (ref 8–23)
CHLORIDE: 111 mmol/L (ref 98–111)
CO2: 21 mmol/L — ABNORMAL LOW (ref 22–32)
Calcium: 8.9 mg/dL (ref 8.9–10.3)
Creatinine, Ser: 1.63 mg/dL — ABNORMAL HIGH (ref 0.44–1.00)
GFR calc Af Amer: 37 mL/min — ABNORMAL LOW (ref >60)
GFR, EST NON AFRICAN AMERICAN: 32 mL/min — AB (ref >60)
GLUCOSE: 123 mg/dL — AB (ref 70–99)
POTASSIUM: 5.9 mmol/L — AB (ref 3.5–5.1)
Sodium: 138 mmol/L (ref 135–145)

## 2018-05-24 LAB — T4, FREE: FREE T4: 1.37 ng/dL (ref 0.82–1.77)

## 2018-05-24 LAB — TSH: TSH: 0.633 u[IU]/mL (ref 0.350–4.500)

## 2018-05-25 ENCOUNTER — Telehealth (HOSPITAL_COMMUNITY): Payer: Self-pay | Admitting: *Deleted

## 2018-05-25 DIAGNOSIS — I5022 Chronic systolic (congestive) heart failure: Secondary | ICD-10-CM

## 2018-05-25 LAB — T3, FREE: T3, Free: 2.8 pg/mL (ref 2.0–4.4)

## 2018-05-25 NOTE — Telephone Encounter (Signed)
Result Notes for T4, free   Notes recorded by Darron Doom, RN on 05/25/2018 at 9:18 AM EDT Patient called back and she confirmed she stopped taking spiro. She is aware that she is to also stop taking rampril and educated on low potassium diet. Lab appointment scheduled and bmet ordered. Patient already scheduled for follow up appointment. No further questions. ------  Notes recorded by Scarlette Calico, RN on 05/24/2018 at 4:54 PM EDT Left message to call back ------  Notes recorded by Scarlette Calico, RN on 05/24/2018 at 4:16 PM EDT Left message to call back ------  Notes recorded by Larey Dresser, MD on 05/24/2018 at 3:18 PM EDT Thryroid labs ok. K is still high. Make sure she stopped spironolactone. I already cut back her rampril, if she really stopped her spironolactone already, needs to stop ramipril with high K. Needs Repeat BMET Thursday or Friday and followup with app next week to reassess med changes and labs.

## 2018-05-27 ENCOUNTER — Ambulatory Visit (HOSPITAL_COMMUNITY)
Admission: RE | Admit: 2018-05-27 | Discharge: 2018-05-27 | Disposition: A | Discharge status: Home / Self Care | Payer: PPO | Source: Ambulatory Visit | Attending: Internal Medicine | Admitting: Internal Medicine

## 2018-05-27 DIAGNOSIS — I5022 Chronic systolic (congestive) heart failure: Secondary | ICD-10-CM | POA: Insufficient documentation

## 2018-05-27 LAB — BASIC METABOLIC PANEL
ANION GAP: 9 (ref 5–15)
BUN: 40 mg/dL — ABNORMAL HIGH (ref 8–23)
CALCIUM: 9.3 mg/dL (ref 8.9–10.3)
CHLORIDE: 109 mmol/L (ref 98–111)
CO2: 20 mmol/L — ABNORMAL LOW (ref 22–32)
CREATININE: 1.5 mg/dL — AB (ref 0.44–1.00)
GFR calc non Af Amer: 35 mL/min — ABNORMAL LOW (ref >60)
GFR, EST AFRICAN AMERICAN: 41 mL/min — AB (ref >60)
Glucose, Bld: 135 mg/dL — ABNORMAL HIGH (ref 70–99)
Potassium: 5.7 mmol/L — ABNORMAL HIGH (ref 3.5–5.1)
SODIUM: 138 mmol/L (ref 135–145)

## 2018-06-01 ENCOUNTER — Telehealth (HOSPITAL_COMMUNITY): Payer: Self-pay

## 2018-06-01 DIAGNOSIS — I5022 Chronic systolic (congestive) heart failure: Secondary | ICD-10-CM

## 2018-06-01 MED ORDER — PATIROMER SORBITEX CALCIUM 8.4 G PO PACK: 8.4000 g | PACK | Freq: Every day | ORAL | 0 refills | Status: AC

## 2018-06-01 NOTE — Telephone Encounter (Signed)
Notes recorded by Shirley Muscat, RN on 06/01/2018 at 11:12 AM EDT Pt aware of results, agreeable to med changes (changes made in [facility]), will get labs at f/u appt ------  Notes recorded by Shirley Muscat, RN on 05/30/2018 at 4:11 PM EDT Pt did not answer and had no VM set-up ------  Notes recorded by Larey Dresser, MD on 05/27/2018 at 6:19 PM EDT I am surprised her K is still so high. Make sure that she has stopped taking ramipril and spironolactone. Low K diet. Would have her take Veltassa 8.4 g daily x 2 doses, then repeat BMET in 1 week.

## 2018-06-07 NOTE — Progress Notes (Signed)
Patient ID: Vanessa [name], female   DOB: 05/28/1951, 67 y.o.   MRN: [id number]    Advanced Heart Failure Clinic Note   PCP: Lennie Odor Primary Cardiologist: Dr. Radford Pax HF Cardiology: Dr. Vita Erm is a 67 y.o. female with history of nonischemic cardiomyopathy presents for followup of CHF. Patient has had a low EF recognized since at least 9/14, when echo showed EF 40-45%.  In 06-27-23, her child died, which led to significant stress.  She developed worsening exertional dyspnea and subsequent echo showed a fall in EF to 25-30%.  She had left and right heart catheterization in 3/16 with no significant coronary disease detected and moderately elevated right and left heart filling pressures along with pulmonary venous hypertension.  Echo was done 12/16: EF 45%, diffuse hypokinesis. She had a repeat echo 11/17 => EF 45% with moderately dilated LV, moderate MR.  Echo 11/18 showed EF stable at 45% with mild LV dilation and mildly decreased RV systolic function, moderate MR.  She presents today for follow up due to abnormal labs. K remained elevated despite multiple med adjustments including stopping spiro and ramipril. She has no complaints. She denies any dyspnea on exertion, chest pain, lightheadedness, or dizziness. 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 She does all of her daily activity and job requirements without difficulty. She took the [facility], but would prefer not to take any more due "terrible taste". Denies palpitations. Taking all medications as directed.   Labs (7/16): K 4.5, creatinine 1.12, BNP 596 Labs (8/16): K 4 => 5.1, creatinine 1.29 => 1.5, BNP 388 => 426 Labs (9/16): K 4, creatinine 1.08, BNP 502 Labs (12/16): K 4.9, creatinine 1.57 Labs (5/17): K 5.2, creatinine 1.3 Labs (8/17): BNP 152 Labs (11/17): K 4.9, creatinine 1.32, BNP 236 Labs (8/18): K 4.6, creatinine 1.57 Labs (11/18): K 3.8, creatinine 1.29 Labs (2/19): K 5.1, creatinine  1.44  PMH: 1. Cardiomyopathy: Nonischemic.  Echo (9/14) with EF 40-45%.  Echo (2/16) with EF 25-30%.  Echo (4/16) with EF 30-35%, moderate MR.  Echo (6/16) with EF 40%, moderate LV dilation, moderate diastolic dysfunction, moderate MR, moderate TR, PA systolic pressure 52 mmHg. RHC/LHC (3/16) with no significant CAD, EF 20-25%, 3+ MR; mean RA 13, PA 70/30 mean 45, mean PCWP 31 mmHg, CI 2.2, PVR 3.1 WU.  Echo (12/16) with EF 45%, diffuse hypokinesis, moderate MR, normal RV size and systolic function.  - Echo (11/17): EF 45%, moderately dilated LV, moderate eccentric posterior MR, PASP 42 mmHg, normal RV size and systolic function.  - Echo (11/18): EF 45%, mild LV dilation, moderate diastolic dysfunction, normal RV size with mildly decreased systolic function, PASP 50 mmHg, moderate MR.  2. PVCs: Holter (10/15) with 12% PVCs. 3. Pulmonary venous hypertension: CTA chest (7/16) negative for PE. PFTs (7/16) with minimal obstruction, mild restriction, moderately decreased DLCO.  4. Thyroid nodules: Biopsy benign.  5. CKD stage 3  SH: Lives alone, only child passed away in 06/27/2023, nonsmoker, no ETOH or drugs.   FH: Father with MIs  Review of systems complete and found to be negative unless listed in HPI.    Current Outpatient Medications  Medication Sig Dispense Refill  . aspirin 81 MG EC tablet Take 1 tablet (81 mg total) by mouth daily.    Marland Kitchen CALCIUM-VITAMIN D PO Take 1 tablet by mouth daily.    . carvedilol (COREG) 25 MG tablet Take 1 tablet (25 mg total) by mouth 2 (two) times  daily with a meal. 180 tablet 2  . furosemide (LASIX) 40 MG tablet Take 1 tablet (40 mg total) by mouth daily. 90 tablet 6  . ibandronate (BONIVA) 150 MG tablet Take 150 mg every 30 (thirty) days by mouth. Take in the morning with a full glass of water, on an empty stomach, and do not take anything else by mouth or lie down for the next 30 min.     No current facility-administered medications for this encounter.    Vitals:    06/09/18 1034  BP: 112/64  Pulse: 71  SpO2: 96%  Weight: 207 lb 8 oz (94.1 kg)   Wt Readings from Last 3 Encounters:  06/09/18 207 lb 8 oz (94.1 kg)  05/19/18 208 lb 6.4 oz (94.5 kg)  01/18/18 212 lb 6.4 oz (96.3 kg)   Physical Exam General: Well appearing. No resp difficulty. HEENT: Normal Neck: Supple. JVP 5-6. Carotids 2+ bilat; no bruits. No thyromegaly or nodule noted. Cor: PMI nondisplaced. RRR, 2/6 HSM apex.  Lungs: CTAB, normal effort. Abdomen: Soft, non-tender, non-distended, no HSM. No bruits or masses. +BS  Extremities: No cyanosis, clubbing, or rash. R and LLE no edema.  Neuro: Alert & orientedx3, cranial nerves grossly intact. moves all 4 extremities w/o difficulty. Affect pleasant   Assessment/Plan: 1. Chronic systolic CHF: Nonischemic cardiomyopathy - EF up to 45% on 12/16, repeat echo 11/18 shows that EF remains about 45%.  Etiology of NICM still unclear. Had frequent PVCs by past holter (12%) but doesn't explain her cardiomyopathy. Had improvement of palpitations on Coreg. Cannot rule out myocarditis. May also have component of Takotsubo with passing of child but EF has not improved as completely as would be expected with Takotsubo. Have previously discussed cardiac MRI to look for infiltrative disease but she says that she would be too claustrophobic. - NYHA II symptoms - Volume status stable on exam.   - Continue Lasix 40 mg daily. BMET today.  - Off spiro and ramipril with Hyper K. Labs today.  - Continue Coreg 25 mg BID.  - Repeat echo in 11/19.  2. PVCs: Frequent (12%) on 10/15 holter. - Pt refused repeat holter monitor for further assessment. PVC count was probably not high enough to cause cardiomyopathy, unlikely primary etiology. - Improved palpitations on Coreg.   - No change to current plan for now 3. Pulmonary hypertension:  - Primarily pulmonary venous HTN (PVR 3.1) from elevated left atrial pressure (previous RHC above).   - Best treatment  remains adequate diuresis.  4. Mitral regurgitation:  - Moderate MR on 11/18 echo, MR murmur on exam. - Repeat echo in 11/19. 5. Hyperkalemia - K has been running high. Ramipril and spironolactone stopped. Veltassa given for 2 days. - Follow up in 11/19 with echo.  Labs today. No med changes with stable BP and recent hyperK. RTC 6-8 weeks for further. May be able to adjust medications back gently based on labwork.   Shirley Friar, PA-C 06/09/2018   Greater than 50% of the 25 minute visit was spent in counseling/coordination of care regarding disease state education, salt/fluid restriction, sliding scale diuretics, and medication compliance.

## 2018-06-09 ENCOUNTER — Ambulatory Visit (HOSPITAL_COMMUNITY)
Admission: RE | Admit: 2018-06-09 | Discharge: 2018-06-09 | Disposition: A | Discharge status: Home / Self Care | Payer: PPO | Source: Ambulatory Visit | Attending: Cardiology | Admitting: Cardiology

## 2018-06-09 VITALS — BP 112/64 | HR 71 | Wt 207.5 lb

## 2018-06-09 DIAGNOSIS — I340 Nonrheumatic mitral (valve) insufficiency: Secondary | ICD-10-CM | POA: Diagnosis not present

## 2018-06-09 DIAGNOSIS — I130 Hypertensive heart and chronic kidney disease with heart failure and stage 1 through stage 4 chronic kidney disease, or unspecified chronic kidney disease: Secondary | ICD-10-CM | POA: Diagnosis not present

## 2018-06-09 DIAGNOSIS — R002 Palpitations: Secondary | ICD-10-CM | POA: Insufficient documentation

## 2018-06-09 DIAGNOSIS — Z79899 Other long term (current) drug therapy: Secondary | ICD-10-CM | POA: Insufficient documentation

## 2018-06-09 DIAGNOSIS — E042 Nontoxic multinodular goiter: Secondary | ICD-10-CM | POA: Diagnosis not present

## 2018-06-09 DIAGNOSIS — I5022 Chronic systolic (congestive) heart failure: Secondary | ICD-10-CM | POA: Diagnosis not present

## 2018-06-09 DIAGNOSIS — I272 Pulmonary hypertension, unspecified: Secondary | ICD-10-CM | POA: Diagnosis not present

## 2018-06-09 DIAGNOSIS — Z7982 Long term (current) use of aspirin: Secondary | ICD-10-CM | POA: Insufficient documentation

## 2018-06-09 DIAGNOSIS — I428 Other cardiomyopathies: Secondary | ICD-10-CM | POA: Insufficient documentation

## 2018-06-09 DIAGNOSIS — E875 Hyperkalemia: Secondary | ICD-10-CM | POA: Diagnosis not present

## 2018-06-09 DIAGNOSIS — I493 Ventricular premature depolarization: Secondary | ICD-10-CM | POA: Diagnosis not present

## 2018-06-09 LAB — BASIC METABOLIC PANEL
ANION GAP: 8 (ref 5–15)
BUN: 35 mg/dL — ABNORMAL HIGH (ref 8–23)
CALCIUM: 9.5 mg/dL (ref 8.9–10.3)
CO2: 25 mmol/L (ref 22–32)
CREATININE: 1.37 mg/dL — AB (ref 0.44–1.00)
Chloride: 107 mmol/L (ref 98–111)
GFR, EST AFRICAN AMERICAN: 45 mL/min — AB (ref >60)
GFR, EST NON AFRICAN AMERICAN: 39 mL/min — AB (ref >60)
Glucose, Bld: 101 mg/dL — ABNORMAL HIGH (ref 70–99)
Potassium: 4.5 mmol/L (ref 3.5–5.1)
Sodium: 140 mmol/L (ref 135–145)

## 2018-06-09 NOTE — Patient Instructions (Signed)
Routine lab work today. Will notify you of abnormal results, otherwise no news is good news!  No changes to medication at this time.  Follow up 6-8 weeks.  ______________________________________________________________ Vanessa [name] Code: 1500  Take all medication as prescribed the day of your appointment. Bring all medications with you to your appointment.  Do the following things EVERYDAY: 1) Weigh yourself in the morning before breakfast. Write it down and keep it in a log. 2) Take your medicines as prescribed 3) Eat low salt foods-Limit salt (sodium) to 2000 mg per day.  4) Stay as active as you can everyday 5) Limit all fluids for the day to less than 2 liters

## 2018-07-25 NOTE — Progress Notes (Signed)
Patient ID: Vanessa [name], female   DOB: [birth year], 67 y.o.   MRN: [id number]    Advanced Heart Failure Clinic Note   PCP: Lennie Odor Primary Cardiologist: Dr. Radford Pax HF Cardiology: Dr. Vita Erm is a 67 y.o. female with history of nonischemic cardiomyopathy presents for followup of CHF. Patient has had a low EF recognized since at least 9/14, when echo showed EF 40-45%.  In Jul 05, 2023, her child died, which led to significant stress.  She developed worsening exertional dyspnea and subsequent echo showed a fall in EF to 25-30%.  She had left and right heart catheterization in 3/16 with no significant coronary disease detected and moderately elevated right and left heart filling pressures along with pulmonary venous hypertension.  Echo was done 12/16: EF 45%, diffuse hypokinesis. She had a repeat echo 11/17 => EF 45% with moderately dilated LV, moderate MR.  Echo 11/18 showed EF stable at 45% with mild LV dilation and mildly decreased RV systolic function, moderate MR.  She presents today for regular follow up. Last visit had check up for elevated K, though repeat labs were normalized. Feeling great overall. 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 She denies any DOE, CP, lightheadedness, or dizziness. Trying to watch the K in her diet. Denies palpitations. Taking all medications as directed. Willing to retry ramipril at low dose.   Labs (7/16): K 4.5, creatinine 1.12, BNP 596 Labs (8/16): K 4 => 5.1, creatinine 1.29 => 1.5, BNP 388 => 426 Labs (9/16): K 4, creatinine 1.08, BNP 502 Labs (12/16): K 4.9, creatinine 1.57 Labs (5/17): K 5.2, creatinine 1.3 Labs (8/17): BNP 152 Labs (11/17): K 4.9, creatinine 1.32, BNP 236 Labs (8/18): K 4.6, creatinine 1.57 Labs (11/18): K 3.8, creatinine 1.29 Labs (2/19): K 5.1, creatinine 1.44  PMH: 1. Cardiomyopathy: Nonischemic.  Echo (9/14) with EF 40-45%.  Echo (2/16) with EF 25-30%.  Echo (4/16) with EF  30-35%, moderate MR.  Echo (6/16) with EF 40%, moderate LV dilation, moderate diastolic dysfunction, moderate MR, moderate TR, PA systolic pressure 52 mmHg. RHC/LHC (3/16) with no significant CAD, EF 20-25%, 3+ MR; mean RA 13, PA 70/30 mean 45, mean PCWP 31 mmHg, CI 2.2, PVR 3.1 WU.  Echo (12/16) with EF 45%, diffuse hypokinesis, moderate MR, normal RV size and systolic function.  - Echo (11/17): EF 45%, moderately dilated LV, moderate eccentric posterior MR, PASP 42 mmHg, normal RV size and systolic function.  - Echo (11/18): EF 45%, mild LV dilation, moderate diastolic dysfunction, normal RV size with mildly decreased systolic function, PASP 50 mmHg, moderate MR.  2. PVCs: Holter (10/15) with 12% PVCs. 3. Pulmonary venous hypertension: CTA chest (7/16) negative for PE. PFTs (7/16) with minimal obstruction, mild restriction, moderately decreased DLCO.  4. Thyroid nodules: Biopsy benign.  5. CKD stage 3  SH: Lives alone, only child passed away in 07/05/23, nonsmoker, no ETOH or drugs.   FH: Father with MIs  Review of systems complete and found to be negative unless listed in HPI.    Current Outpatient Medications  Medication Sig Dispense Refill  . aspirin 81 MG EC tablet Take 1 tablet (81 mg total) by mouth daily.    Marland Kitchen CALCIUM-VITAMIN D PO Take 1 tablet by mouth daily.    . carvedilol (COREG) 25 MG tablet Take 1 tablet (25 mg total) by mouth 2 (two) times daily with a meal. 180 tablet 2  . furosemide (LASIX) 40 MG tablet Take 1  tablet (40 mg total) by mouth daily. (Patient taking differently: Take 40 mg by mouth daily. ) 90 tablet 6  . ibandronate (BONIVA) 150 MG tablet Take 150 mg every 30 (thirty) days by mouth. Take in the morning with a full glass of water, on an empty stomach, and do not take anything else by mouth or lie down for the next 30 min.     No current facility-administered medications for this encounter.    Vitals:   07/26/18 0902  BP: 138/80  Pulse: 81  SpO2: 98%  Weight:  96.3 kg (212 lb 6.4 oz)     Wt Readings from Last 3 Encounters:  07/26/18 96.3 kg (212 lb 6.4 oz)  06/09/18 94.1 kg (207 lb 8 oz)  05/19/18 94.5 kg (208 lb 6.4 oz)   Physical Exam General: Well appearing. No resp difficulty. HEENT: Normal Neck: Supple. JVP 5-6. Carotids 2+ bilat; no bruits. No thyromegaly or nodule noted. Cor: PMI nondisplaced. RRR, 2/6 HSM apex.  Lungs: CTAB, normal effort. Abdomen: Soft, non-tender, non-distended, no HSM. No bruits or masses. +BS  Extremities: No cyanosis, clubbing, or rash. R and LLE no edema.  Neuro: Alert & orientedx3, cranial nerves grossly intact. moves all 4 extremities w/o difficulty. Affect pleasant   Assessment/Plan: 1. Chronic systolic CHF: Nonischemic cardiomyopathy - EF up to 45% on 12/16, repeat echo 11/18 shows that EF remains about 45%.  Etiology of NICM still unclear. Had frequent PVCs by past holter (12%) but doesn't explain her cardiomyopathy. Had improvement of palpitations on Coreg. Cannot rule out myocarditis. May also have component of Takotsubo with passing of child but EF has not improved as completely as would be expected with Takotsubo. Have previously discussed cardiac MRI to look for infiltrative disease but she says that she would be too claustrophobic. - NYHA II symptoms - Volume status stable on exam.   - Continue Lasix 40 mg daily. BMET today.  - Restart ramipril at 2.5 mg once daily. BMET today and 7-10 days - Off spiro with Hyper K.  - Continue Coreg 25 mg BID.  - Repeat echo in 11/19.  2. PVCs: Frequent (12%) on 10/15 holter. - Pt refused repeat holter monitor for further assessment. PVC count was probably not high enough to cause cardiomyopathy, unlikely primary etiology. - Improved palpitations on Coreg.   - No change to current plan.   3. Pulmonary hypertension:  - Primarily pulmonary venous HTN (PVR 3.1) from elevated left atrial pressure (previous RHC above).   - Best treatment remains adequate diuresis.  No change.  4. Mitral regurgitation:  - Moderate MR on 11/18 echo, MR murmur on exam. - Repeat echo in 11/19. No change.  5. Hyperkalemia - K had been running in 5.0 range, but 4.5 06/09/18.   Keep follow up 11/19 with echo. Meds and labs as above.   Shirley Friar, PA-C 07/26/2018   Greater than 50% of the 25 minute visit was spent in counseling/coordination of care regarding disease state education, salt/fluid restriction, sliding scale diuretics, and medication compliance.

## 2018-07-26 ENCOUNTER — Ambulatory Visit (HOSPITAL_COMMUNITY)
Admission: RE | Admit: 2018-07-26 | Discharge: 2018-07-26 | Disposition: A | Discharge status: Home / Self Care | Payer: PPO | Source: Ambulatory Visit | Attending: Cardiology | Admitting: Cardiology

## 2018-07-26 VITALS — BP 138/80 | HR 81 | Wt 212.4 lb

## 2018-07-26 DIAGNOSIS — N183 Chronic kidney disease, stage 3 (moderate): Secondary | ICD-10-CM | POA: Diagnosis not present

## 2018-07-26 DIAGNOSIS — Z79899 Other long term (current) drug therapy: Secondary | ICD-10-CM | POA: Diagnosis not present

## 2018-07-26 DIAGNOSIS — E042 Nontoxic multinodular goiter: Secondary | ICD-10-CM | POA: Insufficient documentation

## 2018-07-26 DIAGNOSIS — E875 Hyperkalemia: Secondary | ICD-10-CM

## 2018-07-26 DIAGNOSIS — I272 Pulmonary hypertension, unspecified: Secondary | ICD-10-CM | POA: Insufficient documentation

## 2018-07-26 DIAGNOSIS — I340 Nonrheumatic mitral (valve) insufficiency: Secondary | ICD-10-CM | POA: Diagnosis not present

## 2018-07-26 DIAGNOSIS — I493 Ventricular premature depolarization: Secondary | ICD-10-CM | POA: Diagnosis not present

## 2018-07-26 DIAGNOSIS — Z7982 Long term (current) use of aspirin: Secondary | ICD-10-CM | POA: Insufficient documentation

## 2018-07-26 DIAGNOSIS — I130 Hypertensive heart and chronic kidney disease with heart failure and stage 1 through stage 4 chronic kidney disease, or unspecified chronic kidney disease: Secondary | ICD-10-CM | POA: Insufficient documentation

## 2018-07-26 DIAGNOSIS — I5022 Chronic systolic (congestive) heart failure: Secondary | ICD-10-CM | POA: Diagnosis not present

## 2018-07-26 DIAGNOSIS — I428 Other cardiomyopathies: Secondary | ICD-10-CM | POA: Diagnosis not present

## 2018-07-26 LAB — BASIC METABOLIC PANEL
Anion gap: 6 (ref 5–15)
BUN: 22 mg/dL (ref 8–23)
CHLORIDE: 107 mmol/L (ref 98–111)
CO2: 28 mmol/L (ref 22–32)
CREATININE: 1.3 mg/dL — AB (ref 0.44–1.00)
Calcium: 8.8 mg/dL — ABNORMAL LOW (ref 8.9–10.3)
GFR calc Af Amer: 48 mL/min — ABNORMAL LOW (ref >60)
GFR calc non Af Amer: 42 mL/min — ABNORMAL LOW (ref >60)
GLUCOSE: 97 mg/dL (ref 70–99)
POTASSIUM: 4.1 mmol/L (ref 3.5–5.1)
SODIUM: 141 mmol/L (ref 135–145)

## 2018-07-26 MED ORDER — RAMIPRIL 2.5 MG PO CAPS: 2.5000 mg | ORAL_CAPSULE | Freq: Every day | ORAL | 3 refills | Status: DC

## 2018-07-26 NOTE — Patient Instructions (Signed)
Labs today (will call for abnormal results, otherwise no news is good news)  START taking Ramipril 2.5 mg (1 Tablet) Once Daily  Labs in 7-10 Days (bmet)  Follow up as scheduled in November.

## 2018-08-02 DIAGNOSIS — I2729 Other secondary pulmonary hypertension: Secondary | ICD-10-CM | POA: Diagnosis not present

## 2018-08-02 DIAGNOSIS — Z0000 Encounter for general adult medical examination without abnormal findings: Secondary | ICD-10-CM | POA: Diagnosis not present

## 2018-08-02 DIAGNOSIS — M8588 Other specified disorders of bone density and structure, other site: Secondary | ICD-10-CM | POA: Diagnosis not present

## 2018-08-02 DIAGNOSIS — I119 Hypertensive heart disease without heart failure: Secondary | ICD-10-CM | POA: Diagnosis not present

## 2018-08-02 DIAGNOSIS — Z136 Encounter for screening for cardiovascular disorders: Secondary | ICD-10-CM | POA: Diagnosis not present

## 2018-08-02 DIAGNOSIS — Z23 Encounter for immunization: Secondary | ICD-10-CM | POA: Diagnosis not present

## 2018-08-02 DIAGNOSIS — I509 Heart failure, unspecified: Secondary | ICD-10-CM | POA: Diagnosis not present

## 2018-08-02 DIAGNOSIS — Z111 Encounter for screening for respiratory tuberculosis: Secondary | ICD-10-CM | POA: Diagnosis not present

## 2018-08-02 DIAGNOSIS — I429 Cardiomyopathy, unspecified: Secondary | ICD-10-CM | POA: Diagnosis not present

## 2018-08-05 ENCOUNTER — Ambulatory Visit (HOSPITAL_COMMUNITY)
Admission: RE | Admit: 2018-08-05 | Discharge: 2018-08-05 | Disposition: A | Discharge status: Home / Self Care | Payer: PPO | Source: Ambulatory Visit | Attending: Cardiology | Admitting: Cardiology

## 2018-08-05 DIAGNOSIS — I5022 Chronic systolic (congestive) heart failure: Secondary | ICD-10-CM | POA: Diagnosis not present

## 2018-08-05 LAB — BASIC METABOLIC PANEL
ANION GAP: 7 (ref 5–15)
BUN: 14 mg/dL (ref 8–23)
CALCIUM: 8.6 mg/dL — AB (ref 8.9–10.3)
CO2: 23 mmol/L (ref 22–32)
CREATININE: 1.08 mg/dL — AB (ref 0.44–1.00)
Chloride: 113 mmol/L — ABNORMAL HIGH (ref 98–111)
GFR calc Af Amer: >60 mL/min (ref >60)
GFR calc non Af Amer: 52 mL/min — ABNORMAL LOW (ref >60)
Glucose, Bld: 105 mg/dL — ABNORMAL HIGH (ref 70–99)
Potassium: 3.8 mmol/L (ref 3.5–5.1)
Sodium: 143 mmol/L (ref 135–145)

## 2018-09-21 ENCOUNTER — Other Ambulatory Visit: Payer: Self-pay | Admitting: Physician Assistant

## 2018-09-21 ENCOUNTER — Ambulatory Visit (HOSPITAL_COMMUNITY)
Admission: RE | Admit: 2018-09-21 | Discharge: 2018-09-21 | Disposition: A | Discharge status: Home / Self Care | Payer: PPO | Source: Ambulatory Visit | Attending: Internal Medicine | Admitting: Internal Medicine

## 2018-09-21 DIAGNOSIS — R600 Localized edema: Secondary | ICD-10-CM | POA: Insufficient documentation

## 2018-09-21 DIAGNOSIS — E669 Obesity, unspecified: Secondary | ICD-10-CM | POA: Diagnosis not present

## 2018-09-21 DIAGNOSIS — I119 Hypertensive heart disease without heart failure: Secondary | ICD-10-CM | POA: Diagnosis not present

## 2018-09-21 DIAGNOSIS — M79606 Pain in leg, unspecified: Secondary | ICD-10-CM | POA: Diagnosis not present

## 2018-09-27 DIAGNOSIS — M79606 Pain in leg, unspecified: Secondary | ICD-10-CM | POA: Diagnosis not present

## 2018-10-03 ENCOUNTER — Encounter (HOSPITAL_COMMUNITY): Payer: Self-pay | Admitting: Cardiology

## 2018-10-03 ENCOUNTER — Ambulatory Visit (HOSPITAL_BASED_OUTPATIENT_CLINIC_OR_DEPARTMENT_OTHER)
Admission: RE | Admit: 2018-10-03 | Discharge: 2018-10-03 | Disposition: A | Discharge status: Home / Self Care | Payer: PPO | Source: Ambulatory Visit | Attending: Cardiology | Admitting: Cardiology

## 2018-10-03 ENCOUNTER — Ambulatory Visit (HOSPITAL_COMMUNITY)
Admission: RE | Admit: 2018-10-03 | Discharge: 2018-10-03 | Disposition: A | Discharge status: Home / Self Care | Payer: PPO | Source: Ambulatory Visit | Attending: Physician Assistant | Admitting: Physician Assistant

## 2018-10-03 VITALS — BP 140/84 | HR 81 | Wt 209.2 lb

## 2018-10-03 DIAGNOSIS — I5022 Chronic systolic (congestive) heart failure: Secondary | ICD-10-CM | POA: Insufficient documentation

## 2018-10-03 DIAGNOSIS — I428 Other cardiomyopathies: Secondary | ICD-10-CM | POA: Insufficient documentation

## 2018-10-03 DIAGNOSIS — I493 Ventricular premature depolarization: Secondary | ICD-10-CM | POA: Diagnosis not present

## 2018-10-03 DIAGNOSIS — Z7982 Long term (current) use of aspirin: Secondary | ICD-10-CM | POA: Insufficient documentation

## 2018-10-03 DIAGNOSIS — I130 Hypertensive heart and chronic kidney disease with heart failure and stage 1 through stage 4 chronic kidney disease, or unspecified chronic kidney disease: Secondary | ICD-10-CM | POA: Diagnosis not present

## 2018-10-03 DIAGNOSIS — I272 Pulmonary hypertension, unspecified: Secondary | ICD-10-CM | POA: Diagnosis not present

## 2018-10-03 DIAGNOSIS — I340 Nonrheumatic mitral (valve) insufficiency: Secondary | ICD-10-CM | POA: Diagnosis not present

## 2018-10-03 DIAGNOSIS — N183 Chronic kidney disease, stage 3 (moderate): Secondary | ICD-10-CM | POA: Diagnosis not present

## 2018-10-03 DIAGNOSIS — Z79899 Other long term (current) drug therapy: Secondary | ICD-10-CM | POA: Insufficient documentation

## 2018-10-03 LAB — BASIC METABOLIC PANEL
ANION GAP: 7 (ref 5–15)
BUN: 19 mg/dL (ref 8–23)
CALCIUM: 8.6 mg/dL — AB (ref 8.9–10.3)
CO2: 24 mmol/L (ref 22–32)
Chloride: 109 mmol/L (ref 98–111)
Creatinine, Ser: 1.38 mg/dL — ABNORMAL HIGH (ref 0.44–1.00)
GFR calc Af Amer: 45 mL/min — ABNORMAL LOW (ref >60)
GFR, EST NON AFRICAN AMERICAN: 39 mL/min — AB (ref >60)
Glucose, Bld: 111 mg/dL — ABNORMAL HIGH (ref 70–99)
POTASSIUM: 4.3 mmol/L (ref 3.5–5.1)
SODIUM: 140 mmol/L (ref 135–145)

## 2018-10-03 MED ORDER — SPIRONOLACTONE 25 MG PO TABS: 12.5000 mg | ORAL_TABLET | Freq: Every day | ORAL | 3 refills | Status: DC

## 2018-10-03 MED ORDER — SACUBITRIL-VALSARTAN 24-26 MG PO TABS: 1.0000 | ORAL_TABLET | Freq: Two times a day (BID) | ORAL | 3 refills | Status: DC

## 2018-10-03 MED ORDER — FUROSEMIDE 40 MG PO TABS: 40.0000 mg | ORAL_TABLET | Freq: Every day | ORAL | 6 refills | Status: DC

## 2018-10-03 NOTE — Patient Instructions (Signed)
Stop Ramipril   Start Entresto 24/26 mg Twice daily STARTING TUE 11/5 PM  Increase Furosemide to 40 mg in AM and 20 mg (1/2 tab) in PM FOR 4 DAYS ONLY, then take 40 mg daily  Start Spironolactone 12.5 mg (1/2 tab) daily  Labs done today  Your physician has requested that you have a TEE. During a TEE, sound waves are used to create images of your heart. It provides your doctor with information about the size and shape of your heart and how well your heart's chambers and valves are working. In this test, a transducer is attached to the end of a flexible tube that's guided down your throat and into your esophagus (the tube leading from you mouth to your stomach) to get a more detailed image of your heart. You are not awake for the procedure. Please see the instruction sheet given to you today. For further information please visit [email or web address].  Your physician recommends that you schedule a follow-up appointment in: 2 weeks   You are scheduled for a TEE on Tuesday 10/11/18 with Dr. Aundra Dubin.  Please arrive at the [facility] (Main Entrance A) at [facility]: [address] [place], [place] [address] at 9:00 am.   DIET: Nothing to eat or drink after midnight except a sip of water with medications (see medication instructions below)  Medication Instructions: Hold Furosemide and Spironolactone Tue 11/12 AM   Labs: Done today  You must have a responsible person to drive you home and stay in the waiting area during your procedure. Failure to do so could result in cancellation.  Bring your insurance cards.  *Special Note: Every effort is made to have your procedure done on time. Occasionally there are emergencies that occur at the hospital that may cause delays. Please be patient if a delay does occur.

## 2018-10-03 NOTE — H&P (View-Only) (Signed)
Patient ID: Vanessa [name], female   DOB: [birth year], 67 y.o.   MRN: [id number]    Advanced Heart Failure Clinic Note   PCP: Lennie Odor Primary Cardiologist: Dr. Radford Pax HF Cardiology: Dr. Aundra Dubin  67 y.o. with history of nonischemic cardiomyopathy presents for followup of CHF. Patient has had a low EF recognized since at least 9/14, when echo showed EF 40-45%.  In 06/25/23, her child died, which led to significant stress.  She developed worsening exertional dyspnea and subsequent echo showed a fall in EF to 25-30%.  She had left and right heart catheterization in 3/16 with no significant coronary disease detected and moderately elevated right and left heart filling pressures along with pulmonary venous hypertension.  Echo was done 12/16: EF 45%, diffuse hypokinesis. She had a repeat echo 11/17 => EF 45% with moderately dilated LV, moderate MR.  Echo 11/18 showed EF stable at 45% with mild LV dilation and mildly decreased RV systolic function, moderate MR.   Spironolactone was stopped due to elevated K. She apparently did not tolerate Veltassa well.   Echo was done today and reviewed.  EF 30-35% with mild LV hypertrophy, mild-moderately decreased RV systolic function, possible severe MR with restricted posterior leaflet, PASP 50 mmHg, severe LAE.   She returns for followup of CHF.  Her weight is not up but she is more symptomatic today.  She thinks that she has been worse since her meds were cut back. She is short of breath walking short distances, also very fatigued.  She has had increased LE edema.  All of this has been going on for about 2 wks.  No chest pain.  No lightheadedness.  No orthopnea.  BP mildly elevated.   Labs (7/16): K 4.5, creatinine 1.12, BNP 596 Labs (8/16): K 4 => 5.1, creatinine 1.29 => 1.5, BNP 388 => 426 Labs (9/16): K 4, creatinine 1.08, BNP 502 Labs (12/16): K 4.9, creatinine 1.57 Labs (5/17): K 5.2, creatinine 1.3 Labs (8/17): BNP 152 Labs (11/17): K 4.9, creatinine  1.32, BNP 236 Labs (8/18): K 4.6, creatinine 1.57 Labs (11/18): K 3.8, creatinine 1.29 Labs (2/19): K 5.1, creatinine 1.44 Labs (9/19): K 3.8, creatinine 1.08  PMH: 1. Cardiomyopathy: Nonischemic.  Echo (9/14) with EF 40-45%.  Echo (2/16) with EF 25-30%.  Echo (4/16) with EF 30-35%, moderate MR.  Echo (6/16) with EF 40%, moderate LV dilation, moderate diastolic dysfunction, moderate MR, moderate TR, PA systolic pressure 52 mmHg. RHC/LHC (3/16) with no significant CAD, EF 20-25%, 3+ MR; mean RA 13, PA 70/30 mean 45, mean PCWP 31 mmHg, CI 2.2, PVR 3.1 WU.  Echo (12/16) with EF 45%, diffuse hypokinesis, moderate MR, normal RV size and systolic function.  - Echo (11/17): EF 45%, moderately dilated LV, moderate eccentric posterior MR, PASP 42 mmHg, normal RV size and systolic function.  - Echo (11/18): EF 45%, mild LV dilation, moderate diastolic dysfunction, normal RV size with mildly decreased systolic function, PASP 50 mmHg, moderate MR.  - Echo (11/19): EF 30-35%, mild LV dilation, severe LAE, mild-moderately decreased RV systolic function, possible severe MR with restricted posterior leaflet, PASP 50 mmHg.  2. PVCs: Holter (10/15) with 12% PVCs. 3. Pulmonary venous hypertension: CTA chest (7/16) negative for PE. PFTs (7/16) with minimal obstruction, mild restriction, moderately decreased DLCO.  4. Thyroid nodules: Biopsy benign.  5. CKD stage 3  SH: Lives alone, only child passed away in 06/25/23, nonsmoker, no ETOH or drugs.   FH: Father with MIs  ROS: All systems reviewed  and negative except as per HPI.   Current Outpatient Medications  Medication Sig Dispense Refill  . aspirin 81 MG EC tablet Take 1 tablet (81 mg total) by mouth daily.    Marland Kitchen CALCIUM-VITAMIN D PO Take 1 tablet by mouth daily.    . carvedilol (COREG) 25 MG tablet Take 1 tablet (25 mg total) by mouth 2 (two) times daily with a meal. 180 tablet 2  . furosemide (LASIX) 40 MG tablet Take 1 tablet (40 mg total) by mouth daily. 30  tablet 6  . ibandronate (BONIVA) 150 MG tablet Take 150 mg every 30 (thirty) days by mouth. Take in the morning with a full glass of water, on an empty stomach, and do not take anything else by mouth or lie down for the next 30 min.    . sacubitril-valsartan (ENTRESTO) 24-26 MG Take 1 tablet by mouth 2 (two) times daily. 60 tablet 3  . spironolactone (ALDACTONE) 25 MG tablet Take 0.5 tablets (12.5 mg total) by mouth daily. 15 tablet 3   No current facility-administered medications for this encounter.    BP 140/84   Pulse 81   Wt 94.9 kg (209 lb 3.2 oz)   SpO2 98%   BMI 38.26 kg/m    Wt Readings from Last 3 Encounters:  10/03/18 94.9 kg (209 lb 3.2 oz)  07/26/18 96.3 kg (212 lb 6.4 oz)  06/09/18 94.1 kg (207 lb 8 oz)   General: NAD Neck: JVP 10-12 cm, no thyromegaly or thyroid nodule.  Lungs: Clear to auscultation bilaterally with normal respiratory effort. CV: Nondisplaced PMI.  Heart regular S1/S2, no S3/S4, 2/6 HSM apex.  No peripheral edema.  No carotid bruit.  Normal pedal pulses.  Abdomen: Soft, nontender, no hepatosplenomegaly, no distention.  Skin: Intact without lesions or rashes.  Neurologic: Alert and oriented x 3.  Psych: Normal affect. Extremities: No clubbing or cyanosis.  HEENT: Normal.   Assessment/Plan: 1. Chronic systolic CHF: Nonischemic cardiomyopathy.  EF up to 45% on 12/16, repeat echo 11/18 shows that EF remains about 45%.  Etiology of NICM still unclear. Had frequent PVCs by past holter (12%) but doesn't explain her cardiomyopathy. Had improvement of palpitations on Coreg. Cannot rule out myocarditis. May also have component of Takotsubo with passing of child but EF has not improved as completely as would be expected with Takotsubo. Have previously discussed cardiac MRI to look for infiltrative disease but she says that she would be too claustrophobic.  She has been doing worse for about 2 wks, echo today was reviewed with EF down to 30-35% and mild to moderate  RV systolic function.  There also appeared to be severe mitral regurgitation, which seemed progressive from the past.  - Increase Lasix to 40 qam/20 qpm x 4 days then 40 mg daily after that (has been taking Lasix 20 mg daily).   - Restart spironolactone 12.5 mg daily.  - Continue Coreg 25 mg bid.  - Stop ramipril, after 36 hrs restart Entresto 24/26 bid.  - BMET today and again in 10 days.  If K rises again, she did not tolerate Veltassa so would try Lokelma.  2. PVCs: Frequent (12%) on 10/15 holter.   Pt refused repeat holter monitor for further assessment. PVC count was probably not high enough to cause cardiomyopathy, unlikely primary etiology. - Improved palpitations on Coreg.   3. Pulmonary hypertension: Primarily pulmonary venous HTN (PVR 3.1) from elevated left atrial pressure (previous RHC above).   - Best treatment remains adequate diuresis.  4. Mitral regurgitation:  Moderate MR on 11/18 echo.  Today's echo appears to have severe MR with some restriction of posterior leaflet.  This may play a role in the worsening of her symptoms and volume retention.   - I will arrange for TEE to assess MR more closely.  Will plan to do this next week after she has had diuresis and is on Entresto.  If MR is truly severe, she may be a Mitraclip candidate.  We discussed risks/benefits of procedure and she agrees to TEE.    Followup in 2 wks.   [name]  10/03/2018

## 2018-10-03 NOTE — Progress Notes (Signed)
Patient ID: Vanessa [name], female   DOB: 10/19/1951, 67 y.o.   MRN: [id number]    Advanced Heart Failure Clinic Note   PCP: Lennie Odor Primary Cardiologist: Dr. Radford Pax HF Cardiology: Dr. Aundra Dubin  67 y.o. with history of nonischemic cardiomyopathy presents for followup of CHF. Patient has had a low EF recognized since at least 9/14, when echo showed EF 40-45%.  In 06/15/2023, her child died, which led to significant stress.  She developed worsening exertional dyspnea and subsequent echo showed a fall in EF to 25-30%.  She had left and right heart catheterization in 3/16 with no significant coronary disease detected and moderately elevated right and left heart filling pressures along with pulmonary venous hypertension.  Echo was done 12/16: EF 45%, diffuse hypokinesis. She had a repeat echo 11/17 => EF 45% with moderately dilated LV, moderate MR.  Echo 11/18 showed EF stable at 45% with mild LV dilation and mildly decreased RV systolic function, moderate MR.   Spironolactone was stopped due to elevated K. She apparently did not tolerate Veltassa well.   Echo was done today and reviewed.  EF 30-35% with mild LV hypertrophy, mild-moderately decreased RV systolic function, possible severe MR with restricted posterior leaflet, PASP 50 mmHg, severe LAE.   She returns for followup of CHF.  Her weight is not up but she is more symptomatic today.  She thinks that she has been worse since her meds were cut back. She is short of breath walking short distances, also very fatigued.  She has had increased LE edema.  All of this has been going on for about 2 wks.  No chest pain.  No lightheadedness.  No orthopnea.  BP mildly elevated.   Labs (7/16): K 4.5, creatinine 1.12, BNP 596 Labs (8/16): K 4 => 5.1, creatinine 1.29 => 1.5, BNP 388 => 426 Labs (9/16): K 4, creatinine 1.08, BNP 502 Labs (12/16): K 4.9, creatinine 1.57 Labs (5/17): K 5.2, creatinine 1.3 Labs (8/17): BNP 152 Labs (11/17): K 4.9, creatinine  1.32, BNP 236 Labs (8/18): K 4.6, creatinine 1.57 Labs (11/18): K 3.8, creatinine 1.29 Labs (2/19): K 5.1, creatinine 1.44 Labs (9/19): K 3.8, creatinine 1.08  PMH: 1. Cardiomyopathy: Nonischemic.  Echo (9/14) with EF 40-45%.  Echo (2/16) with EF 25-30%.  Echo (4/16) with EF 30-35%, moderate MR.  Echo (6/16) with EF 40%, moderate LV dilation, moderate diastolic dysfunction, moderate MR, moderate TR, PA systolic pressure 52 mmHg. RHC/LHC (3/16) with no significant CAD, EF 20-25%, 3+ MR; mean RA 13, PA 70/30 mean 45, mean PCWP 31 mmHg, CI 2.2, PVR 3.1 WU.  Echo (12/16) with EF 45%, diffuse hypokinesis, moderate MR, normal RV size and systolic function.  - Echo (11/17): EF 45%, moderately dilated LV, moderate eccentric posterior MR, PASP 42 mmHg, normal RV size and systolic function.  - Echo (11/18): EF 45%, mild LV dilation, moderate diastolic dysfunction, normal RV size with mildly decreased systolic function, PASP 50 mmHg, moderate MR.  - Echo (11/19): EF 30-35%, mild LV dilation, severe LAE, mild-moderately decreased RV systolic function, possible severe MR with restricted posterior leaflet, PASP 50 mmHg.  2. PVCs: Holter (10/15) with 12% PVCs. 3. Pulmonary venous hypertension: CTA chest (7/16) negative for PE. PFTs (7/16) with minimal obstruction, mild restriction, moderately decreased DLCO.  4. Thyroid nodules: Biopsy benign.  5. CKD stage 3  SH: Lives alone, only child passed away in Jun 15, 2023, nonsmoker, no ETOH or drugs.   FH: Father with MIs  ROS: All systems reviewed  and negative except as per HPI.   Current Outpatient Medications  Medication Sig Dispense Refill  . aspirin 81 MG EC tablet Take 1 tablet (81 mg total) by mouth daily.    Marland Kitchen CALCIUM-VITAMIN D PO Take 1 tablet by mouth daily.    . carvedilol (COREG) 25 MG tablet Take 1 tablet (25 mg total) by mouth 2 (two) times daily with a meal. 180 tablet 2  . furosemide (LASIX) 40 MG tablet Take 1 tablet (40 mg total) by mouth daily. 30  tablet 6  . ibandronate (BONIVA) 150 MG tablet Take 150 mg every 30 (thirty) days by mouth. Take in the morning with a full glass of water, on an empty stomach, and do not take anything else by mouth or lie down for the next 30 min.    . sacubitril-valsartan (ENTRESTO) 24-26 MG Take 1 tablet by mouth 2 (two) times daily. 60 tablet 3  . spironolactone (ALDACTONE) 25 MG tablet Take 0.5 tablets (12.5 mg total) by mouth daily. 15 tablet 3   No current facility-administered medications for this encounter.    BP 140/84   Pulse 81   Wt 94.9 kg (209 lb 3.2 oz)   SpO2 98%   BMI 38.26 kg/m    Wt Readings from Last 3 Encounters:  10/03/18 94.9 kg (209 lb 3.2 oz)  07/26/18 96.3 kg (212 lb 6.4 oz)  06/09/18 94.1 kg (207 lb 8 oz)   General: NAD Neck: JVP 10-12 cm, no thyromegaly or thyroid nodule.  Lungs: Clear to auscultation bilaterally with normal respiratory effort. CV: Nondisplaced PMI.  Heart regular S1/S2, no S3/S4, 2/6 HSM apex.  No peripheral edema.  No carotid bruit.  Normal pedal pulses.  Abdomen: Soft, nontender, no hepatosplenomegaly, no distention.  Skin: Intact without lesions or rashes.  Neurologic: Alert and oriented x 3.  Psych: Normal affect. Extremities: No clubbing or cyanosis.  HEENT: Normal.   Assessment/Plan: 1. Chronic systolic CHF: Nonischemic cardiomyopathy.  EF up to 45% on 12/16, repeat echo 11/18 shows that EF remains about 45%.  Etiology of NICM still unclear. Had frequent PVCs by past holter (12%) but doesn't explain her cardiomyopathy. Had improvement of palpitations on Coreg. Cannot rule out myocarditis. May also have component of Takotsubo with passing of child but EF has not improved as completely as would be expected with Takotsubo. Have previously discussed cardiac MRI to look for infiltrative disease but she says that she would be too claustrophobic.  She has been doing worse for about 2 wks, echo today was reviewed with EF down to 30-35% and mild to moderate  RV systolic function.  There also appeared to be severe mitral regurgitation, which seemed progressive from the past.  - Increase Lasix to 40 qam/20 qpm x 4 days then 40 mg daily after that (has been taking Lasix 20 mg daily).   - Restart spironolactone 12.5 mg daily.  - Continue Coreg 25 mg bid.  - Stop ramipril, after 36 hrs restart Entresto 24/26 bid.  - BMET today and again in 10 days.  If K rises again, she did not tolerate Veltassa so would try Lokelma.  2. PVCs: Frequent (12%) on 10/15 holter.   Pt refused repeat holter monitor for further assessment. PVC count was probably not high enough to cause cardiomyopathy, unlikely primary etiology. - Improved palpitations on Coreg.   3. Pulmonary hypertension: Primarily pulmonary venous HTN (PVR 3.1) from elevated left atrial pressure (previous RHC above).   - Best treatment remains adequate diuresis.  4. Mitral regurgitation:  Moderate MR on 11/18 echo.  Today's echo appears to have severe MR with some restriction of posterior leaflet.  This may play a role in the worsening of her symptoms and volume retention.   - I will arrange for TEE to assess MR more closely.  Will plan to do this next week after she has had diuresis and is on Entresto.  If MR is truly severe, she may be a Mitraclip candidate.  We discussed risks/benefits of procedure and she agrees to TEE.    Followup in 2 wks.   [name]  10/03/2018

## 2018-10-03 NOTE — Progress Notes (Signed)
  Echocardiogram 2D Echocardiogram has been performed.  Merrie Roof F 10/03/2018, 10:49 AM

## 2018-10-05 ENCOUNTER — Other Ambulatory Visit (HOSPITAL_COMMUNITY): Payer: Self-pay | Admitting: *Deleted

## 2018-10-05 DIAGNOSIS — I340 Nonrheumatic mitral (valve) insufficiency: Secondary | ICD-10-CM

## 2018-10-11 ENCOUNTER — Ambulatory Visit (HOSPITAL_COMMUNITY)
Admission: RE | Admit: 2018-10-11 | Discharge: 2018-10-11 | Disposition: A | Discharge status: Home / Self Care | Payer: PPO | Source: Ambulatory Visit | Attending: Cardiology | Admitting: Cardiology

## 2018-10-11 ENCOUNTER — Other Ambulatory Visit: Payer: Self-pay

## 2018-10-11 ENCOUNTER — Encounter (HOSPITAL_COMMUNITY)
Admission: RE | Disposition: A | Discharge status: Home / Self Care | Payer: Self-pay | Source: Ambulatory Visit | Attending: Cardiology

## 2018-10-11 ENCOUNTER — Observation Stay (HOSPITAL_BASED_OUTPATIENT_CLINIC_OR_DEPARTMENT_OTHER)
Admission: RE | Admit: 2018-10-11 | Discharge: 2018-10-11 | Disposition: A | Discharge status: Home / Self Care | Payer: PPO | Source: Ambulatory Visit | Attending: Cardiology | Admitting: Cardiology

## 2018-10-11 ENCOUNTER — Encounter (HOSPITAL_COMMUNITY): Payer: Self-pay | Admitting: *Deleted

## 2018-10-11 DIAGNOSIS — I130 Hypertensive heart and chronic kidney disease with heart failure and stage 1 through stage 4 chronic kidney disease, or unspecified chronic kidney disease: Secondary | ICD-10-CM | POA: Insufficient documentation

## 2018-10-11 DIAGNOSIS — I5022 Chronic systolic (congestive) heart failure: Secondary | ICD-10-CM | POA: Diagnosis not present

## 2018-10-11 DIAGNOSIS — N183 Chronic kidney disease, stage 3 (moderate): Secondary | ICD-10-CM | POA: Diagnosis not present

## 2018-10-11 DIAGNOSIS — I2510 Atherosclerotic heart disease of native coronary artery without angina pectoris: Secondary | ICD-10-CM | POA: Diagnosis not present

## 2018-10-11 DIAGNOSIS — Z7982 Long term (current) use of aspirin: Secondary | ICD-10-CM | POA: Diagnosis not present

## 2018-10-11 DIAGNOSIS — I428 Other cardiomyopathies: Secondary | ICD-10-CM | POA: Diagnosis not present

## 2018-10-11 DIAGNOSIS — I493 Ventricular premature depolarization: Secondary | ICD-10-CM | POA: Diagnosis not present

## 2018-10-11 DIAGNOSIS — I340 Nonrheumatic mitral (valve) insufficiency: Secondary | ICD-10-CM | POA: Diagnosis not present

## 2018-10-11 HISTORY — PX: TEE WITHOUT CARDIOVERSION: SHX5443

## 2018-10-11 SURGERY — ECHOCARDIOGRAM, TRANSESOPHAGEAL
Anesthesia: Moderate Sedation

## 2018-10-11 MED ORDER — FENTANYL CITRATE (PF) 100 MCG/2ML IJ SOLN
INTRAMUSCULAR | Status: AC
  Filled 2018-10-11: qty 2

## 2018-10-11 MED ORDER — SODIUM CHLORIDE 0.9 % IV SOLN: INTRAVENOUS | Status: DC

## 2018-10-11 MED ORDER — FENTANYL CITRATE (PF) 100 MCG/2ML IJ SOLN
INTRAMUSCULAR | Status: DC | PRN
  Administered 2018-10-11: 25 ug via INTRAVENOUS

## 2018-10-11 MED ORDER — MIDAZOLAM HCL (PF) 10 MG/2ML IJ SOLN
INTRAMUSCULAR | Status: DC | PRN
  Administered 2018-10-11: 1 mg via INTRAVENOUS
  Administered 2018-10-11: 2 mg via INTRAVENOUS

## 2018-10-11 MED ORDER — BUTAMBEN-TETRACAINE-BENZOCAINE 2-2-14 % EX AERO
INHALATION_SPRAY | CUTANEOUS | Status: DC | PRN
  Administered 2018-10-11: 2 via TOPICAL

## 2018-10-11 MED ORDER — MIDAZOLAM HCL (PF) 5 MG/ML IJ SOLN
INTRAMUSCULAR | Status: AC
  Filled 2018-10-11: qty 2

## 2018-10-11 NOTE — Interval H&P Note (Signed)
History and Physical Interval Note:  10/11/2018 10:12 AM  Vanessa [name]  has presented today for surgery, with the diagnosis of MITRAL REGURGITATION  The various methods of treatment have been discussed with the patient and family. After consideration of risks, benefits and other options for treatment, the patient has consented to  Procedure(s): TRANSESOPHAGEAL ECHOCARDIOGRAM (TEE) (N/A) as a surgical intervention .  The patient's history has been reviewed, patient examined, no change in status, stable for surgery.  I have reviewed the patient's chart and labs.  Questions were answered to the patient's satisfaction.     London Tarnowski [employer]

## 2018-10-11 NOTE — CV Procedure (Signed)
Procedure: TEE  Indication: Mitral regurgitation.   Sedation: Versed 3 mg IV, Fentanyl 25 mcg IV  Findings: Please see echo section for full report.  Moderately dilated left ventricle with EF 30-35%.  Normal RV size with mild to moderately decreased systolic function.  Severe left atrial enlargement.  No LA appendage thrombus.  Mild right atrial enlargement.  No ASD/PFO by color doppler.  Mild TR, peak RV-RA gradient 50 mmHg.  Trileaflet aortic valve with no regurgitation or stenosis.  There was restriction of the posterior mitral leaflet and incomplete coaptation of the anterior and posterior leaflets with eccentric, posteriorly-directed mitral regurgitation.  The mitral regurgitation appeared severe.  There was systolic flow reversal in one out of two pulmonary veins interrogated.  PISA ERO 0.41 cm^2.  No mitral stenosis.   Normal caliber thoracic aorta with minimal plaque.   Impression: Severe, eccentric mitral regurgitation with restriction of posterior leaflet and poor coaptation.  Will refer for Mitraclip evaluation.   Vanessa [name] 10/11/2018 10:53 AM

## 2018-10-11 NOTE — Progress Notes (Signed)
  Echocardiogram Echocardiogram Transesophageal has been performed.  Vanessa [name] Vanessa [name] Vanessa [name] 10/11/2018, 11:14 AM

## 2018-10-11 NOTE — Discharge Instructions (Signed)
Mitral Valve Regurgitation Mitral valve regurgitation, also called mitral regurgitation, is a condition in which blood leaks from the mitral valve in the heart. The mitral valve is located between the upper left chamber (left atrium) and the lower left chamber (left ventricle) of the heart. Normally, this valve opens when the atrium pumps blood into the ventricle, and it closes when the ventricle pumps blood out to the body. Mitral valve regurgitation happens when the mitral valve does not close properly. As a result, blood in the ventricle leaks back into the atrium. Mitral valve regurgitation causes the heart to work harder to pump blood. If the condition is mild, a person may not have symptoms. However, over time, this can lead to heart failure. What are the causes? This condition may be caused by:  A condition in which the mitral valves do not close completely when the heart pumps blood (mitral valve prolapse).  Infection, such as endocarditis or rheumatic fever.  Damage to the mitral valve, such as from injury (trauma) to the heart, a problem present at birth (birth defect), or a heart attack.  Certain medicines.  What increases the risk? This condition is more likely to develop in people who have:  Certain forms of heart disease.  A family history of heart valve disease.  Certain conditions that are present at birth (congenital).  You are also more likely to develop this condition if you have taken certain diet pills in the past. What are the signs or symptoms? Symptoms of this condition include:  Shortness of breath with physical activity, like climbing stairs.  Fast or irregular heartbeat.  Cough.  Suddenly waking up at night with difficulty breathing or needing to urinate.  Heavy breathing.  Extreme tiredness.  Swelling in the lower legs, ankles, and feet.  In some cases of mild to moderate mitral regurgitation, there are no symptoms. How is this diagnosed? This  condition may be diagnosed based on the results of a physical exam. Your health care provider will listen to your heart for an abnormal heart sound (murmur). You may also have other tests, including:  An echocardiogram. This test creates ultrasound images of the heart that allow your health care provider to see how the heart valves work while your heart is beating.  Chest X-ray.  Electrocardiogram (ECG). This is a test that records the electrical impulses of the heart.  Cardiac catheterization. This test is used to look at the structure and function of the heart. A thin tube (catheter) is passed through the blood vessels and into the heart. Dye is injected into the blood vessels so the cardiac system can be seen on images that are taken.  How is this treated? This condition may be treated with:  Medicines. These may be given to treat symptoms and prevent complications.  Surgery to repair or replace the mitral valve in severe, long-term (chronic) cases.  Follow these instructions at home: Lifestyle  Limit alcohol intake to no more than 1 drink a day for nonpregnant women and 2 drinks a day for men. One drink equals 12 oz of beer, 5 oz of wine, or 1 oz of hard liquor.  Do not use any products that contain nicotine or tobacco, such as cigarettes and e-cigarettes. If you need help quitting, ask your health care provider.  Eat a heart-healthy diet that includes plenty of fresh fruits and vegetables, whole grains, low-fat (lean) protein, and low-fat dairy products. Consider working with a diet and nutrition specialist (dietitian) to help  you make healthy food choices.  Limit the amount of salt (sodium) in your diet. Avoid adding salt to foods, and avoid foods that are high in salt, such as: ? Pickles. ? Smoked and cured meats. ? Processed foods.  Maintain a healthy weight and stay physically active. Ask your health care provider to recommend activities that are safe for you.  Try to get 7  or more hours of sleep each night.  Find ways to manage stress. If you need help with this, ask your health care provider. General instructions   Take over-the-counter and prescription medicines only as told by your health care provider.  Work closely with your health care provider to manage any other health conditions you have, such as diabetes or high blood pressure.  If you plan to become pregnant, talk with your health care provider first.  Keep all follow-up visits as told by your health care provider. This is important. Contact a health care provider if:  You have a fever.  You feel more tired than usual when doing physical activity.  You have a dry cough. Get help right away if:  You have shortness of breath.  You develop chest pain.  You have swelling in your hands, feet, ankles, or abdomen that is getting worse.  You have trouble staying awake or you faint.  You feel dizzy or unsteady.  You suddenly gain weight.  You feel confused.  Any of your symptoms begin to get worse. These symptoms may represent a serious problem that is an emergency. Do not wait to see if the symptoms will go away. Get medical help right away. Call your local emergency services (911 in the [place] Do not drive yourself to the hospital. Summary  Mitral valve regurgitation, also called mitral regurgitation, is a condition in which blood leaks from a valve between two chambers of the heart (mitral valve).  Depending on how severe your condition is, you may be treated with medicines or surgery.  Practice heart-healthy habits to manage this condition. These include limiting alcohol, avoiding nicotine and tobacco, and eating a balanced diet that is low in salt (sodium). This information is not intended to replace advice given to you by your health care provider. Make sure you discuss any questions you have with your health care provider. Document Released: 02/03/2005 Document Revised: 08/28/2016  Document Reviewed: 08/28/2016 Elsevier Interactive Patient Education  2018 [employer]. Transesophageal Echocardiogram Transesophageal echocardiography (TEE) is a special type of test that produces images of the heart by using sound waves (echocardiogram). This type of echocardiography can obtain better images of the heart than standard echocardiography. TEE is done by passing a flexible tube down the esophagus. The heart is located in front of the esophagus. Because the heart and esophagus are close to one another, your health care provider can take very clear, detailed pictures of the heart via ultrasound waves. TEE may be done:  If your health care provider needs more information based on standard echocardiography findings.  If you had a stroke. This might have happened because a clot formed in your heart. TEE can visualize different areas of the heart and check for clots.  To check valve anatomy and function.  To check for infection on the inside of your heart (endocarditis).  To evaluate the dividing wall (septum) of the heart and presence of a hole that did not close after birth (patent foramen ovale or atrial septal defect).  To help diagnose a tear in the wall of the  aorta (aortic dissection).  During cardiac valve surgery. This allows the surgeon to assess the valve repair before closing the chest.  During a variety of other cardiac procedures to guide positioning of catheters.  Sometimes before a cardioversion, which is a shock to convert heart rhythm back to normal.  Tell a health care provider about:  Any allergies you have.  All medicines you are taking, including vitamins, herbs, eye drops, creams, and over-the-counter medicines.  Any problems you or family members have had with anesthetic medicines.  Any blood disorders you have.  Any surgeries you have had.  Any medical conditions you have.  Swallowing difficulties.  An esophageal obstruction. What are the  risks? Generally, TEE is a safe procedure. However, as with any procedure, complications can occur. Possible complications include an esophageal tear (rupture). What happens before the procedure?  Do not eat or drink for 6 hours before the procedure or as directed by your health care provider.  Arrange for someone to drive you home after the procedure. Do not drive yourself home. During the procedure, you will be given medicines that can continue to make you feel drowsy and can impair your reflexes.  An IV access tube will be started in the arm. What happens during the procedure?  A medicine to help you relax (sedative) will be given through the IV access tube.  A medicine may be sprayed or gargled to numb the back of the throat.  Your blood pressure, heart rate, and breathing (vital signs) will be monitored during the procedure.  The TEE probe is a long, flexible tube. The tip of the probe is placed into the back of the mouth, and you will be asked to swallow. This helps to pass the tip of the probe into the esophagus. Once the tip of the probe is in the correct area, your health care provider can take pictures of the heart.  TEE is usually not a painful procedure. You may feel the probe press against the back of the throat. The probe does not enter the trachea and does not affect your breathing. What happens after the procedure?  You will be in bed, resting, until you have fully returned to consciousness.  When you first awaken, your throat may feel slightly sore and will probably still feel numb. This will improve slowly over time.  You will not be allowed to eat or drink until it is clear that the numbness has improved.  Once you have been able to drink, urinate, and sit on the edge of the bed without feeling sick to your stomach (nausea) or dizzy, you may be cleared to go home.  You should have a friend or family member with you for the next 24 hours after your procedure. This  information is not intended to replace advice given to you by your health care provider. Make sure you discuss any questions you have with your health care provider. Document Released: 02/06/2003 Document Revised: 04/23/2016 Document Reviewed: 05/18/2013 Elsevier Interactive Patient Education  2018 [employer]. Moderate Conscious Sedation, Adult Sedation is the use of medicines to promote relaxation and relieve discomfort and anxiety. Moderate conscious sedation is a type of sedation. Under moderate conscious sedation, you are less alert than normal, but you are still able to respond to instructions, touch, or both. Moderate conscious sedation is used during short medical and dental procedures. It is milder than deep sedation, which is a type of sedation under which you cannot be easily woken up. It  is also milder than general anesthesia, which is the use of medicines to make you unconscious. Moderate conscious sedation allows you to return to your regular activities sooner. Tell a health care provider about:  Any allergies you have.  All medicines you are taking, including vitamins, herbs, eye drops, creams, and over-the-counter medicines.  Use of steroids (by mouth or creams).  Any problems you or family members have had with sedatives and anesthetic medicines.  Any blood disorders you have.  Any surgeries you have had.  Any medical conditions you have, such as sleep apnea.  Whether you are pregnant or may be pregnant.  Any use of cigarettes, alcohol, marijuana, or street drugs. What are the risks? Generally, this is a safe procedure. However, problems may occur, including:  Getting too much medicine (oversedation).  Nausea.  Allergic reaction to medicines.  Trouble breathing. If this happens, a breathing tube may be used to help with breathing. It will be removed when you are awake and breathing on your own.  Heart trouble.  Lung trouble.  What happens before the  procedure? Staying hydrated Follow instructions from your health care provider about hydration, which may include:  Up to 2 hours before the procedure - you may continue to drink clear liquids, such as water, clear fruit juice, black coffee, and plain tea.  Eating and drinking restrictions Follow instructions from your health care provider about eating and drinking, which may include:  8 hours before the procedure - stop eating heavy meals or foods such as meat, fried foods, or fatty foods.  6 hours before the procedure - stop eating light meals or foods, such as toast or cereal.  6 hours before the procedure - stop drinking milk or drinks that contain milk.  2 hours before the procedure - stop drinking clear liquids.  Medicine  Ask your health care provider about:  Changing or stopping your regular medicines. This is especially important if you are taking diabetes medicines or blood thinners.  Taking medicines such as aspirin and ibuprofen. These medicines can thin your blood. Do not take these medicines before your procedure if your health care provider instructs you not to.  Tests and exams  You will have a physical exam.  You may have blood tests done to show: ? How well your kidneys and liver are working. ? How well your blood can clot. General instructions  Plan to have someone take you home from the hospital or clinic.  If you will be going home right after the procedure, plan to have someone with you for 24 hours. What happens during the procedure?  An IV tube will be inserted into one of your veins.  Medicine to help you relax (sedative) will be given through the IV tube.  The medical or dental procedure will be performed. What happens after the procedure?  Your blood pressure, heart rate, breathing rate, and blood oxygen level will be monitored often until the medicines you were given have worn off.  Do not drive for 24 hours. This information is not  intended to replace advice given to you by your health care provider. Make sure you discuss any questions you have with your health care provider. Document Released: 08/11/2001 Document Revised: 04/21/2016 Document Reviewed: 03/07/2016 Elsevier Interactive Patient Education  [employer].

## 2018-10-12 ENCOUNTER — Encounter (HOSPITAL_COMMUNITY): Payer: Self-pay | Admitting: Cardiology

## 2018-10-18 ENCOUNTER — Telehealth (HOSPITAL_COMMUNITY): Payer: Self-pay | Admitting: Pharmacist

## 2018-10-18 ENCOUNTER — Encounter (HOSPITAL_COMMUNITY): Payer: Self-pay | Admitting: Cardiology

## 2018-10-18 ENCOUNTER — Ambulatory Visit (HOSPITAL_COMMUNITY)
Admission: RE | Admit: 2018-10-18 | Discharge: 2018-10-18 | Disposition: A | Discharge status: Home / Self Care | Payer: PPO | Source: Ambulatory Visit | Attending: Cardiology | Admitting: Cardiology

## 2018-10-18 VITALS — BP 110/71 | HR 72 | Wt 201.4 lb

## 2018-10-18 DIAGNOSIS — I340 Nonrheumatic mitral (valve) insufficiency: Secondary | ICD-10-CM

## 2018-10-18 DIAGNOSIS — Z7982 Long term (current) use of aspirin: Secondary | ICD-10-CM | POA: Diagnosis not present

## 2018-10-18 DIAGNOSIS — I272 Pulmonary hypertension, unspecified: Secondary | ICD-10-CM | POA: Insufficient documentation

## 2018-10-18 DIAGNOSIS — I428 Other cardiomyopathies: Secondary | ICD-10-CM | POA: Diagnosis not present

## 2018-10-18 DIAGNOSIS — I493 Ventricular premature depolarization: Secondary | ICD-10-CM

## 2018-10-18 DIAGNOSIS — N183 Chronic kidney disease, stage 3 (moderate): Secondary | ICD-10-CM | POA: Insufficient documentation

## 2018-10-18 DIAGNOSIS — Z79899 Other long term (current) drug therapy: Secondary | ICD-10-CM | POA: Insufficient documentation

## 2018-10-18 DIAGNOSIS — I5022 Chronic systolic (congestive) heart failure: Secondary | ICD-10-CM | POA: Diagnosis not present

## 2018-10-18 DIAGNOSIS — I130 Hypertensive heart and chronic kidney disease with heart failure and stage 1 through stage 4 chronic kidney disease, or unspecified chronic kidney disease: Secondary | ICD-10-CM | POA: Insufficient documentation

## 2018-10-18 LAB — BASIC METABOLIC PANEL
Anion gap: 7 (ref 5–15)
BUN: 26 mg/dL — AB (ref 8–23)
CALCIUM: 8.9 mg/dL (ref 8.9–10.3)
CHLORIDE: 109 mmol/L (ref 98–111)
CO2: 24 mmol/L (ref 22–32)
CREATININE: 1.3 mg/dL — AB (ref 0.44–1.00)
GFR calc Af Amer: 48 mL/min — ABNORMAL LOW (ref >60)
GFR calc non Af Amer: 41 mL/min — ABNORMAL LOW (ref >60)
Glucose, Bld: 106 mg/dL — ABNORMAL HIGH (ref 70–99)
Potassium: 4.3 mmol/L (ref 3.5–5.1)
SODIUM: 140 mmol/L (ref 135–145)

## 2018-10-18 LAB — BRAIN NATRIURETIC PEPTIDE: B Natriuretic Peptide: 960.4 pg/mL — ABNORMAL HIGH (ref 0.0–100.0)

## 2018-10-18 NOTE — Patient Instructions (Signed)
Labs today We will only contact you if something comes back abnormal or we need to make some changes. Otherwise no news is good news!  SAMPLES OF ENTRESTO 24/26 WAS PROVIDED FOR YOU TODAY.   Your physician recommends that you schedule a follow-up appointment in: 4 WEEKS

## 2018-10-18 NOTE — Telephone Encounter (Signed)
Entresto 24-26 mg BID PA approved by [employer] through 11/30/19.   Ruta Hinds. Velva Harman, PharmD, BCPS, CPP Clinical Pharmacist Phone: [phone] 10/18/2018 1:49 PM

## 2018-10-18 NOTE — Progress Notes (Signed)
Patient ID: Vanessa [name], female   DOB: [birth year], 67 y.o.   MRN: [id number]    Advanced Heart Failure Clinic Note   PCP: Lennie Odor Primary Cardiologist: Dr. Radford Pax HF Cardiology: Dr. Aundra Dubin  67 y.o. with history of nonischemic cardiomyopathy presents for followup of CHF. Patient has had a low EF recognized since at least 9/14, when echo showed EF 40-45%.  In June 30, 2023, her child died, which led to significant stress.  She developed worsening exertional dyspnea and subsequent echo showed a fall in EF to 25-30%.  She had left and right heart catheterization in 3/16 with no significant coronary disease detected and moderately elevated right and left heart filling pressures along with pulmonary venous hypertension.  Echo was done 12/16: EF 45%, diffuse hypokinesis. She had a repeat echo 11/17 => EF 45% with moderately dilated LV, moderate MR.  Echo 11/18 showed EF stable at 45% with mild LV dilation and mildly decreased RV systolic function, moderate MR.   Spironolactone was stopped due to elevated K. She apparently did not tolerate Veltassa well.   Echo was done today and reviewed.  EF 30-35% with mild LV hypertrophy, mild-moderately decreased RV systolic function, possible severe MR with restricted posterior leaflet, PASP 50 mmHg, severe LAE.   Today she returns for HF follow up. Last visit ace was stopped and entresto was started. She also increased diuretics for a few days. She says leg edema has resolved and she is feeling much better. Overall feeling much better.  Able to walk around the grocery store. Denies PND/Orthopnea. Appetite ok. No fever or chills. She has not been weighing at home. Taking all medications.  Labs (7/16): K 4.5, creatinine 1.12, BNP 596 Labs (8/16): K 4 => 5.1, creatinine 1.29 => 1.5, BNP 388 => 426 Labs (9/16): K 4, creatinine 1.08, BNP 502 Labs (12/16): K 4.9, creatinine 1.57 Labs (5/17): K 5.2, creatinine 1.3 Labs (8/17): BNP 152 Labs (11/17): K 4.9, creatinine  1.32, BNP 236 Labs (8/18): K 4.6, creatinine 1.57 Labs (11/18): K 3.8, creatinine 1.29 Labs (2/19): K 5.1, creatinine 1.44 Labs (9/19): K 3.8, creatinine 1.08  PMH: 1. Cardiomyopathy: Nonischemic.  Echo (9/14) with EF 40-45%.  Echo (2/16) with EF 25-30%.  Echo (4/16) with EF 30-35%, moderate MR.  Echo (6/16) with EF 40%, moderate LV dilation, moderate diastolic dysfunction, moderate MR, moderate TR, PA systolic pressure 52 mmHg. RHC/LHC (3/16) with no significant CAD, EF 20-25%, 3+ MR; mean RA 13, PA 70/30 mean 45, mean PCWP 31 mmHg, CI 2.2, PVR 3.1 WU.  Echo (12/16) with EF 45%, diffuse hypokinesis, moderate MR, normal RV size and systolic function.  - Echo (11/17): EF 45%, moderately dilated LV, moderate eccentric posterior MR, PASP 42 mmHg, normal RV size and systolic function.  - Echo (11/18): EF 45%, mild LV dilation, moderate diastolic dysfunction, normal RV size with mildly decreased systolic function, PASP 50 mmHg, moderate MR.  - Echo (11/19): EF 30-35%, mild LV dilation, severe LAE, mild-moderately decreased RV systolic function, possible severe MR with restricted posterior leaflet, PASP 50 mmHg.  2. PVCs: Holter (10/15) with 12% PVCs. 3. Pulmonary venous hypertension: CTA chest (7/16) negative for PE. PFTs (7/16) with minimal obstruction, mild restriction, moderately decreased DLCO.  4. Thyroid nodules: Biopsy benign.  5. CKD stage 3  SH: Lives alone, only child passed away in 06-30-23, nonsmoker, no ETOH or drugs.   FH: Father with MIs  ROS: All systems reviewed and negative except as per HPI.   Current Outpatient Medications  Medication Sig Dispense Refill  . aspirin 81 MG EC tablet Take 1 tablet (81 mg total) by mouth daily.    . Calcium Carbonate-Vitamin D (CALCIUM 600+D PO) Take 1 tablet by mouth daily.    . carvedilol (COREG) 25 MG tablet Take 1 tablet (25 mg total) by mouth 2 (two) times daily with a meal. 180 tablet 2  . furosemide (LASIX) 40 MG tablet Take 1 tablet (40 mg  total) by mouth daily. 30 tablet 6  . ibandronate (BONIVA) 150 MG tablet Take 150 mg every 30 (thirty) days by mouth. Take in the morning with a full glass of water, on an empty stomach, and do not take anything else by mouth or lie down for the next 30 min.    . sacubitril-valsartan (ENTRESTO) 24-26 MG Take 1 tablet by mouth 2 (two) times daily. 60 tablet 3  . spironolactone (ALDACTONE) 25 MG tablet Take 0.5 tablets (12.5 mg total) by mouth daily. 15 tablet 3   No current facility-administered medications for this encounter.    BP 110/71   Pulse 72   Wt 91.4 kg (201 lb 6.4 oz)   SpO2 99%   BMI 36.84 kg/m    Wt Readings from Last 3 Encounters:  10/18/18 91.4 kg (201 lb 6.4 oz)  10/03/18 94.9 kg (209 lb 3.2 oz)  07/26/18 96.3 kg (212 lb 6.4 oz)   General:  Well appearing. No resp difficulty HEENT: normal Neck: supple . JVP 7-8. Carotids 2+ bilat; no bruits. No lymphadenopathy or thryomegaly appreciated. Cor: PMI nondisplaced. Regular rate & rhythm. No rubs, gallops. 2/6 HSM  Lungs: clear Abdomen: soft, nontender, nondistended. No hepatosplenomegaly. No bruits or masses. Good bowel sounds. Extremities: no cyanosis, clubbing, rash, edema Neuro: alert & orientedx3, cranial nerves grossly intact. moves all 4 extremities w/o difficulty. Affect pleasant    Assessment/Plan: 1. Chronic systolic CHF: Nonischemic cardiomyopathy.  EF up to 45% on 12/16, repeat echo 11/18 shows that EF remains about 45%.  Etiology of NICM still unclear. Had frequent PVCs by past holter (12%) but doesn't explain her cardiomyopathy. Had improvement of palpitations on Coreg. Cannot rule out myocarditis. May also have component of Takotsubo with passing of child but EF has not improved as completely as would be expected with Takotsubo. Have previously discussed cardiac MRI to look for infiltrative disease but she says that she would be too claustrophobic.   - ECHO with severe MR. TEE completed and showed severe MR.    - NYHA II-III. Improved from previous visit. Volume status improved. Continue lasix 40 mg daily.    - Continue spironolactone 12.5 mg daily.  - Continue Coreg 25 mg bid.  - Continue  Entresto 24/26 bid. She was provided with samples of entresto.  - Check BMET today .  -   If K rises again, she did not tolerate Veltassa so would try Lokelma.  2. PVCs: Frequent (12%) on 10/15 holter.   Pt refused repeat holter monitor for further assessment. PVC count was probably not high enough to cause cardiomyopathy, unlikely primary etiology. - Continue coreg.   3. Pulmonary hypertension: Primarily pulmonary venous HTN (PVR 3.1) from elevated left atrial pressure (previous RHC above).   - Best treatment remains adequate diuresis. 4. Mitral regurgitation:  Moderate MR on 11/18 echo.  ECHO 2019 with  severe MR with some restriction of posterior leaflet.  This may play a role in the worsening of her symptoms and volume retention.   - TEE showed severe mitral regurgitation. Referred  to mitral clip clinic. She has an appointment with Dr Burt Knack 10/20/2018   Follow up in 4 weeks. I have asked HF pharmacist to follow up with Ms Dunaway about entresto.       Amy Clegg NP-C  10/18/2018

## 2018-10-18 NOTE — Progress Notes (Signed)
Entresto 24/26mg  2 sample bottles given to patient. Lot number [id number] EXP 6/21

## 2018-10-20 ENCOUNTER — Ambulatory Visit (INDEPENDENT_AMBULATORY_CARE_PROVIDER_SITE_OTHER): Payer: PPO | Admitting: Cardiovascular Disease

## 2018-10-20 ENCOUNTER — Telehealth (HOSPITAL_COMMUNITY): Payer: Self-pay

## 2018-10-20 ENCOUNTER — Encounter: Payer: Self-pay | Admitting: Cardiovascular Disease

## 2018-10-20 VITALS — BP 124/72 | HR 83 | Ht 62.0 in | Wt 202.1 lb

## 2018-10-20 DIAGNOSIS — I340 Nonrheumatic mitral (valve) insufficiency: Secondary | ICD-10-CM

## 2018-10-20 DIAGNOSIS — I5022 Chronic systolic (congestive) heart failure: Secondary | ICD-10-CM | POA: Diagnosis not present

## 2018-10-20 NOTE — Telephone Encounter (Signed)
[facility] PA approved Case: [id number], Status: Approved, Coverage Starts on: 10/18/2018, Coverage Ends on: 11/30/2019  Vertis Kelch, PharmD PGY1 Pharmacy Resident Phone [phone] 10/20/2018       11:50 AM

## 2018-10-20 NOTE — Progress Notes (Addendum)
Cardiology Office Note:    Date:  10/20/2018   ID:  [name], DOB [birth year], MRN [id number]  PCP:  Lennie Odor, PA-C  Cardiologist:  No primary care provider on file.  Electrophysiologist:  None   Referring MD: Lennie Odor, PA-C   Chief Complaint  Patient presents with  . Shortness of Breath   History of Present Illness:    Vanessa [name] is a 67 y.o. female with a hx of nonischemic cardiomyopathy and chronic systolic heart failure, presenting for evaluation of severe mitral regurgitation.  The patient is here with a friend today.  She has been noted to have continued symptoms of chronic systolic heart failure with New York Heart Association functional class II-III functional limitation.  Her most recent echo was suggestive of severe mitral regurgitation and she recently underwent transesophageal echo for further evaluation.  This demonstrated findings consistent with severe mitral regurgitation secondary to posterior leaflet restriction and leaflet malcoaptation.  The patient's EROA is 0.41 and there is evidence of pulmonary vein flow reversal.   The patient complains of exertional dyspnea with walking to her mailbox. She has to stop and rest. States her limitation varies based on her 'fluid level.' She has had several episodes of volume excess requiring adjustment in her diuretic dosage. She's recently been placed on Entresto. She denies symptoms of chest pain, chest pressure, orthopnea, or PND. She admits to leg swelling but not currently having issues with this.   Past Medical History:  Diagnosis Date  . Dysrhythmia    PVC's  . Hypertension   . Moderate mitral regurgitation    moderate to severe MR with moderate pulmonary HTN  . Nonischemic cardiomyopathy ([place])    EF 30-35% by echo 2015  . Pulmonary HTN ([place])    moderate with PASP 63mmHg by echo 2015  . PVC's (premature ventricular contractions)     Past Surgical History:  Procedure Laterality Date  .  CARDIAC CATHETERIZATION  2003   normal coronary arteries  . CARDIAC CATHETERIZATION  2013  Nov   h/o nonischemic DCM EF 35-40% increase LVEDP   . HYSTEROSCOPY W/D&C  12/02/2012   Procedure: DILATATION AND CURETTAGE /HYSTEROSCOPY;  Surgeon: Maeola Sarah. Landry Mellow, MD;  Location: [facility];  Service: Gynecology;  Laterality: N/A;  cervical polypectomy  . LEFT AND RIGHT HEART CATHETERIZATION WITH CORONARY ANGIOGRAM N/A 02/15/2015   Procedure: LEFT AND RIGHT HEART CATHETERIZATION WITH CORONARY ANGIOGRAM;  Surgeon: Jettie Booze, MD;  Location: [facility] CATH LAB;  Service: Cardiovascular;  Laterality: N/A;  . TEE WITHOUT CARDIOVERSION N/A 10/11/2018   Procedure: TRANSESOPHAGEAL ECHOCARDIOGRAM (TEE);  Surgeon: Larey Dresser, MD;  Location[facility] ENDOSCOPY;  Service: Cardiovascular;  Laterality: N/A;    Current Medications: Current Meds  Medication Sig  . aspirin 81 MG EC tablet Take 1 tablet (81 mg total) by mouth daily.  . Calcium Carbonate-Vitamin D (CALCIUM 600+D PO) Take 1 tablet by mouth daily.  . carvedilol (COREG) 25 MG tablet Take 1 tablet (25 mg total) by mouth 2 (two) times daily with a meal.  . furosemide (LASIX) 40 MG tablet Take 1 tablet (40 mg total) by mouth daily.  Marland Kitchen ibandronate (BONIVA) 150 MG tablet Take 150 mg every 30 (thirty) days by mouth. Take in the morning with a full glass of water, on an empty stomach, and do not take anything else by mouth or lie down for the next 30 min.  . sacubitril-valsartan (ENTRESTO) 24-26 MG Take 1 tablet by mouth 2 (two) times daily.  Marland Kitchen  spironolactone (ALDACTONE) 25 MG tablet Take 0.5 tablets (12.5 mg total) by mouth daily.     Allergies:   Patient has no known allergies.   Social History   Socioeconomic History  . Marital status: Single    Spouse name: Not on file  . Number of children: Not on file  . Years of education: Not on file  . Highest education level: Not on file  Occupational History  . Occupation: Retired    Comment: Tourist information centre manager, [employer]  . Financial resource strain: Not on file  . Food insecurity:    Worry: Not on file    Inability: Not on file  . Transportation needs:    Medical: Not on file    Non-medical: Not on file  Tobacco Use  . Smoking status: Never Smoker  . Smokeless tobacco: Never Used  Substance and Sexual Activity  . Alcohol use: No    Alcohol/week: 0.0 standard drinks  . Drug use: No  . Sexual activity: Not on file  Lifestyle  . Physical activity:    Days per week: Not on file    Minutes per session: Not on file  . Stress: Not on file  Relationships  . Social connections:    Talks on phone: Not on file    Gets together: Not on file    Attends religious service: Not on file    Active member of club or organization: Not on file    Attends meetings of clubs or organizations: Not on file    Relationship status: Not on file  Other Topics Concern  . Not on file  Social History Narrative  . Not on file     Family History: The patient's family history includes Breast cancer (age of onset: 8) in her sister; Breast cancer (age of onset: 31) in her mother; Heart disease in her father. There is no history of Colon cancer, Esophageal cancer, Rectal cancer, or Stomach cancer.  ROS:   Please see the history of present illness.    Positive for leg pain, leg swelling, irregular heart beats. All other systems reviewed and are negative.  EKGs/Labs/Other Studies Reviewed:    The following studies were reviewed today: TEE: Study Conclusions  - Left ventricle: The cavity size was moderately dilated. Wall   thickness was normal. Systolic function was moderately to   severely reduced. The estimated ejection fraction was in the   range of 30% to 35%. Diffuse hypokinesis. - Aortic valve: There was no stenosis. - Aorta: Normal caliber thoracic aorta with minimal plaque. - Mitral valve: There was restriction of the posterior mitral   leaflet and incomplete coaptation of the  anterior and posterior   leaflets with eccentric, posteriorly-directed mitral   regurgitation. The mitral regurgitation appeared severe. There   was systolic flow reversal in one out of two pulmonary veins   interrogated. PISA ERO 0.41 cm^2. No mitral stenosis. - Left atrium: The atrium was severely dilated. No evidence of   thrombus in the atrial cavity or appendage. - Right ventricle: The cavity size was normal. Systolic function   was mildly to moderately reduced. - Right atrium: The atrium was mildly dilated. - Atrial septum: No ASD/PFO by color doppler. - Tricuspid valve: Peak RV-RA gradient (S): 50 mm Hg.  Impressions:  - Severe, eccentric mitral regurgitation with restriction of   posterior leaflet and poor coaptation. Will refer for Mitraclip   evaluation.  2D Echo: Study Conclusions  - Left ventricle: The  cavity size was mildly dilated. Wall   thickness was normal. Systolic function was moderately to   severely reduced. The estimated ejection fraction was in the   range of 30% to 35%. Features are consistent with a pseudonormal   left ventricular filling pattern, with concomitant abnormal   relaxation and increased filling pressure (grade 2 diastolic   dysfunction). - Aortic valve: There was no stenosis. - Mitral valve: There was probably severe regurgitation with some   restriction of posterior leaflet. - Left atrium: The atrium was severely dilated. - Right ventricle: The cavity size was normal. Systolic function   was mildly to moderately reduced. - Tricuspid valve: Peak RV-RA gradient (S): 42 mm Hg. - Pulmonary arteries: PA peak pressure: 50 mm Hg (S). - Systemic veins: IVC measured 1.8 cm with < 50% respirophasic   variation suggesting RA pressure 8 mmHg. - Pericardium, extracardiac: A trivial pericardial effusion was   identified.  Impressions:  - Mildly dilated LV with EF 30-35%, diffuse hypokinesis. Normal RV   size with mild to moderately decreased  systolic function.   Probably severe mitral regurgitation with some restriction of   posterior leaflet noted. Moderate pulmonary hypertension.  Cath 02-15-2015: PROCEDURE:  Right and Left heart catheterization with selective coronary angiography, left ventriculogram.  INDICATIONS:  Cardiomyopathy, shortness of breath  The risks, benefits, and details of the procedure were explained to the patient.  The patient verbalized understanding and wanted to proceed.  Informed written consent was obtained.  PROCEDURE TECHNIQUE:  After Xylocaine anesthesia, a 7 French sheath was placed in the right common femoral vein using the modified Seldinger technique. After Xylocaine anesthesia a 49F sheath was placed in the right femoral artery with a single anterior needle wall stick.   7 [place] balloontipped Swan-Ganz catheter was advanced to the pulmonary artery under fluoroscopic guidance. Oxygen saturations were performed. Hemodynamic pressure assessment was performed. Left coronary angiography was done using a Judkins L4 guide catheter.  Right coronary angiography was done using a Judkins R4 guide catheter.  Left ventriculography was done using a pigtail catheter. Manual compression will be used for hemostasis.   CONTRAST:  Total of 70 cc.  COMPLICATIONS:  None.    HEMODYNAMICS:  Aortic pressure was 108/72; LV pressure was 110/13; LVEDP 25.  There was no gradient between the left ventricle and aorta.  Right atrial pressure 15/14, mean right atrial pressure 13 mmHg; RV pressure 67/10, RVEDP 13 mmHg; pulmonary artery pressure 70/30, mean pulmonary artery pressure 45 mmHg; pulmonary Where wedge pressure 33/41, mean pulmonary capillary wedge pressure 31; PA saturation 54%, aortic saturation 91%, cardiac output by Fick 4.4 L/m. Cardiac index 2.2.  ANGIOGRAPHIC DATA:   The left main coronary artery is short but widely patent.  The left anterior descending artery is a large vessel which wraps around the apex.  There is no significant atherosclerosis in the LAD. There are several small diagonals which are patent.  The left circumflex artery is a large vessel. There are 2 small obtuse marginals which are patent. The third and fourth obtuse marginals are large and widely patent.  The right coronary artery is a large, dominant vessel. The posterior lateral artery is small. The posterior descending artery is large and patent.  LEFT VENTRICULOGRAM:  Left ventricular angiogram was done in the 30 RAO projection and revealed globally decreased left ventricular wall motion and overall severely decreased systolic function with an estimated ejection fraction of 20-25%.  LVEDP was 25 mmHg. 3+ mitral regurgitation.  IMPRESSIONS:  1. No significant coronary artery disease. 2. Severely decreased left ventricular systolic function.  LVEDP 15 mmHg.  Ejection fraction 20-25%. 3.   Severe pulmonary artery hypertension.  Mitral regurgitation.  PA saturation 54%, aortic saturation 91%, cardiac output by Fick 4.4 L/m. Cardiac index 2.2.  RECOMMENDATION:  Continue aggressive medical therapy for her nonischemic cardiomyopathy.  Follow-up with Dr. Radford Pax.  EKG:  EKG is not ordered today.   Recent Labs: 05/24/2018: TSH 0.633 10/18/2018: B Natriuretic Peptide 960.4; BUN 26; Creatinine, Ser 1.30; Potassium 4.3; Sodium 140  Recent Lipid Panel    Component Value Date/Time   CHOL 193 11/13/2008 2027   TRIG 116 11/13/2008 2027   HDL 55 11/13/2008 2027   CHOLHDL 3.5 Ratio 11/13/2008 2027   VLDL 23 11/13/2008 2027   LDLCALC 115 (H) 11/13/2008 2027    Physical Exam:    VS:  BP 124/72   Pulse 83   Ht 5\' 2"  (1.575 m)   Wt 202 lb 1.9 oz (91.7 kg)   SpO2 97%   BMI 36.97 kg/m     Wt Readings from Last 3 Encounters:  10/20/18 202 lb 1.9 oz (91.7 kg)  10/18/18 201 lb 6.4 oz (91.4 kg)  10/03/18 209 lb 3.2 oz (94.9 kg)     GEN: Well nourished, well developed overweight in no acute distress HEENT: Normal NECK:  No JVD; No carotid bruits LYMPHATICS: No lymphadenopathy CARDIAC: RRR, there is a 3/6 holosystolic murmur at the RUSB RESPIRATORY:  Clear to auscultation without rales, wheezing or rhonchi  ABDOMEN: Soft, non-tender, non-distended MUSCULOSKELETAL:  Trace bilateral pretibial edema; No deformity  SKIN: Warm and dry NEUROLOGIC:  Alert and oriented x 3 PSYCHIATRIC:  Normal affect   STS Risk Calculator: Procedure: Isolated MVR   Risk of Mortality: 2.745%  Renal Failure: 5.632%  Permanent Stroke: 1.512%  Prolonged Ventilation: 13.648%  DSW Infection: 0.166%  Reoperation: 3.535%  Morbidity or Mortality: 18.152%  Short Length of Stay: 16.363%  Long Length of Stay: 12.172%   Procedure: MV Repair   Risk of Mortality: 1.487%  Renal Failure: 2.432%  Permanent Stroke: 1.109%  Prolonged Ventilation: 9.274%  DSW Infection: 0.087%  Reoperation: 2.662%  Morbidity or Mortality: 12.477%  Short Length of Stay: 26.955%  Long Length of Stay: 6.109%   ASSESSMENT:    1. Non-rheumatic mitral regurgitation   2. Chronic systolic heart failure (HCC)    PLAN:    In order of problems listed above:  1. The patient has chronic systolic heart failure with NYHA functional class II symptoms at present, and severe mitral regurgitation. I have personally reviewed her recent TEE and previous cath study. TEE demonstrates severe LV systolic dysfunction with moderate LV dilatation and evidence of severe mitral regurgitation secondary to posterior leaflet restriction and leaflet mal-coaptation with posteriorly directed MR. She clearly meets echo criteria for severe MR with an ERO of 0.41 and pulmonary vein flow reversal. In addition, there is marked bowing of the interatrial septum suggesting high LA pressure. The patient's previous cath study demonstrated pulmonary HTN and angiographically normal coronary arteries. The patient is on maximally tolerated heart failure therapy and is not a candidate for CRT as she  has a normal QRS duration on EKG. I agree she would potentially benefit from percutaneous mitral valve repair based on her reduced LVEF, severe symptomatic mitral regurgitation, and only moderately dilated LV. I reviewed available data demonstrating reduction in heart failure hospitalization and improved mortality in similar patients who undergo percutaneous mitral valve repair compared with ongoing  medical therapy. We discussed pros and cons of percutaneous mitral valve repair versus surgical repair today. I also reviewed the procedural expectations, typical recovery, and potential complications of MitraClip with the patient. She understands the risks of cardiac injury, tamponade, device embolization, vascular injury, MI, stroke, emergency surgery, and death occur at low frequency.   She would like to pursue treatment of mitral regurgitation. I will refer her for formal cardiac surgical evaluation with Dr Roxy Manns as part of a multidisciplinary approach to her care. If she proceeds with percutaneous mitral valve repair, I would anticipate performing a right heart catheterization at the time of the procedure. I don't think she needs a repeat coronary angiogram with her known hx of normal coronary arteries in 2016.    Medication Adjustments/Labs and Tests Ordered: Current medicines are reviewed at length with the patient today.  Concerns regarding medicines are outlined above.  No orders of the defined types were placed in this encounter.  No orders of the defined types were placed in this encounter.   Patient Instructions  Medication Instructions:  Your provider recommends that you continue on your current medications as directed. Please refer to the Current Medication list given to you today.    Labwork: None today  Testing/Procedures: No new orders   Follow-Up: You will be called to arrange further follow-up appointments.  Any Other Special Instructions Will Be Listed Below (If  Applicable).     If you need a refill on your cardiac medications before your next appointment, please call your pharmacy.      Signed, Sherren Mocha, MD  10/20/2018 10:08 AM    [place]

## 2018-10-20 NOTE — Patient Instructions (Signed)
Medication Instructions:  Your provider recommends that you continue on your current medications as directed. Please refer to the Current Medication list given to you today.    Labwork: None today  Testing/Procedures: No new orders   Follow-Up: You will be called to arrange further follow-up appointments.  Any Other Special Instructions Will Be Listed Below (If Applicable).     If you need a refill on your cardiac medications before your next appointment, please call your pharmacy.

## 2018-11-07 ENCOUNTER — Encounter: Payer: Self-pay | Admitting: Thoracic Surgery (Cardiothoracic Vascular Surgery)

## 2018-11-07 ENCOUNTER — Institutional Professional Consult (permissible substitution): Payer: PPO | Admitting: Thoracic Surgery (Cardiothoracic Vascular Surgery)

## 2018-11-07 VITALS — BP 115/72 | HR 73 | Resp 20 | Ht 63.0 in | Wt 201.0 lb

## 2018-11-07 DIAGNOSIS — I340 Nonrheumatic mitral (valve) insufficiency: Secondary | ICD-10-CM | POA: Diagnosis not present

## 2018-11-07 DIAGNOSIS — I420 Dilated cardiomyopathy: Secondary | ICD-10-CM

## 2018-11-07 DIAGNOSIS — I5022 Chronic systolic (congestive) heart failure: Secondary | ICD-10-CM | POA: Diagnosis not present

## 2018-11-07 NOTE — Progress Notes (Addendum)
HEART AND Vanessa [name] SURGERY CONSULTATION REPORT  Referring Provider is Sherren Mocha, MD  Advanced Heart Failure Cardiologist is Larey Dresser, MD Primary Cardiologist is Fransico Him, MD PCP is Lennie Odor, PA-C  Chief Complaint  Patient presents with  . Mitral Regurgitation    Surgical eval, TEE 10/11/18, ECHO 10/03/18, last Cardiac Cath 02/15/15 and PFT's 06/04/15    HPI:  Patient is a 67 year old morbidly obese African-American female with history of nonischemic cardiomyopathy and chronic systolic congestive heart failure who has been referred for surgical consultation to discuss treatment options for management of severe mitral regurgitation.  Patient's cardiac history dates back nearly 10 years ago when she was first evaluated for symptoms of exertional shortness of breath and fatigue.  She states that she was first told that she had a heart murmur by Dr. Dannielle Burn.  She was diagnosed with nonischemic cardiomyopathy, hypertension, and mitral regurgitation.  She was followed for several years by Dr. Radford Pax.  Catheterization performed in 2016 revealed normal coronary arteries.  Ejection fraction was estimated 20 to 25%.  Transthoracic echocardiogram revealed moderate (3+) mitral regurgitation and moderate pulmonary hypertension.  She was referred to the pulmonary medicine clinic where she was evaluated by Dr. Halford Chessman felt that the patient's pulmonary hypertension was likely related to her underlying heart failure and mitral valve disease.  She was referred to the advanced heart failure clinic and has been followed carefully ever since by Dr. Aundra Dubin.  Most recent follow-up transthoracic echocardiogram performed October 03, 2018 revealed diffuse global left ventricular hypokinesis with ejection fraction estimated 30 to 35% in the setting of severe mitral regurgitation.  There was moderate pulmonary hypertension and severe left atrial  enlargement.  Right ventricular size was felt to be normal and there was trivial tricuspid regurgitation but there was mild to moderate right ventricular systolic dysfunction.  TEE was performed October 11, 2018 and confirmed the presence of severe mitral regurgitation.  The patient's ERO measured 0.41 cm using PISA corresponding to regurgitant volume estimated 89 mL.  Left ventricular systolic function was moderate to severely reduced with ejection fraction estimated 30 to 35% in the setting of severe mitral regurgitation.  The patient was referred to the multidisciplinary heart valve clinic and has been evaluated previously by Dr. Burt Knack.  Treatment options were discussed and the patient has been referred for elective surgical consultation.  Patient is divorced and lives alone locally in [place].  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  She remains functionally independent and lives a sedentary lifestyle.  She drives an automobile and cares for herself.  She states that she gets tired with activity, particularly walking up a flight of stairs or incline.  She states that she only gets short of breath with similar exertion.  Her breathing is comfortable at baseline.  She does not record her weight on a consistent basis.  She states that she can tell when she gets fluid overload because she develops increased cough.  She denies symptoms of orthopnea although she states that she is not comfortable laying on her right side when she sleeps at night.  She is never had any chest pain or chest tightness.  She denies any history of PND.  She reports mild intermittent lower extremity edema.  She denies problems with significant abdominal bloating or swelling.  Past Medical History:  Diagnosis Date  . Dysrhythmia    PVC's  .  Hypertension   . Moderate mitral regurgitation    moderate to severe MR with moderate pulmonary HTN  . Nonischemic  cardiomyopathy ([place])    EF 30-35% by echo 2015  . Pulmonary HTN ([place])    moderate with PASP 22mmHg by echo 2015  . PVC's (premature ventricular contractions)     Past Surgical History:  Procedure Laterality Date  . CARDIAC CATHETERIZATION  2003   normal coronary arteries  . CARDIAC CATHETERIZATION  2013  Nov   h/o nonischemic DCM EF 35-40% increase LVEDP   . HYSTEROSCOPY W/D&C  12/02/2012   Procedure: DILATATION AND CURETTAGE /HYSTEROSCOPY;  Surgeon: Maeola Sarah. Landry Mellow, MD;  Location: [facility];  Service: Gynecology;  Laterality: N/A;  cervical polypectomy  . LEFT AND RIGHT HEART CATHETERIZATION WITH CORONARY ANGIOGRAM N/A 02/15/2015   Procedure: LEFT AND RIGHT HEART CATHETERIZATION WITH CORONARY ANGIOGRAM;  Surgeon: Jettie Booze, MD;  Location: [facility] CATH LAB;  Service: Cardiovascular;  Laterality: N/A;  . TEE WITHOUT CARDIOVERSION N/A 10/11/2018   Procedure: TRANSESOPHAGEAL ECHOCARDIOGRAM (TEE);  Surgeon: Larey Dresser, MD;  Location: [facility]COPY;  Service: Cardiovascular;  Laterality: N/A;    Family History  Problem Relation Age of Onset  . Breast cancer Sister 27  . Heart disease Father   . Breast cancer Mother 41  . Colon cancer Neg Hx   . Esophageal cancer Neg Hx   . Rectal cancer Neg Hx   . Stomach cancer Neg Hx     Social History   Socioeconomic History  . Marital status: Single    Spouse name: Not on file  . Number of children: Not on file  . Years of education: Not on file  . Highest education level: Not on file  Occupational History  . Occupation: Retired    Comment: Tourist information centre manager, [employer]  . Financial resource strain: Not on file  . Food insecurity:    Worry: Not on file    Inability: Not on file  . Transportation needs:    Medical: Not on file    Non-medical: Not on file  Tobacco Use  . Smoking status: Never Smoker  . Smokeless tobacco: Never Used  Substance and Sexual Activity  . Alcohol use: No    Alcohol/week: 0.0 standard  drinks  . Drug use: No  . Sexual activity: Not on file  Lifestyle  . Physical activity:    Days per week: Not on file    Minutes per session: Not on file  . Stress: Not on file  Relationships  . Social connections:    Talks on phone: Not on file    Gets together: Not on file    Attends religious service: Not on file    Active member of club or organization: Not on file    Attends meetings of clubs or organizations: Not on file    Relationship status: Not on file  . Intimate partner violence:    Fear of current or ex partner: Not on file    Emotionally abused: Not on file    Physically abused: Not on file    Forced sexual activity: Not on file  Other Topics Concern  . Not on file  Social History Narrative  . Not on file    Current Outpatient Medications  Medication Sig Dispense Refill  . aspirin 81 MG EC tablet Take 1 tablet (81 mg total) by mouth daily.    . Calcium Carbonate-Vitamin D (CALCIUM 600+D PO) Take 1 tablet by mouth  daily.    . carvedilol (COREG) 25 MG tablet Take 1 tablet (25 mg total) by mouth 2 (two) times daily with a meal. 180 tablet 2  . furosemide (LASIX) 40 MG tablet Take 1 tablet (40 mg total) by mouth daily. 30 tablet 6  . ibandronate (BONIVA) 150 MG tablet Take 150 mg every 30 (thirty) days by mouth. Take in the morning with a full glass of water, on an empty stomach, and do not take anything else by mouth or lie down for the next 30 min.    . sacubitril-valsartan (ENTRESTO) 24-26 MG Take 1 tablet by mouth 2 (two) times daily. 60 tablet 3  . spironolactone (ALDACTONE) 25 MG tablet Take 0.5 tablets (12.5 mg total) by mouth daily. 15 tablet 3   No current facility-administered medications for this visit.     No Known Allergies    Review of Systems:   General:  normal appetite, decreased energy, no weight gain, no weight loss, no fever  Cardiac:  no chest pain with exertion, no chest pain at rest, +SOB with exertion, no resting SOB, no PND, no  orthopnea, no palpitations, no arrhythmia, no atrial fibrillation, + LE edema, no dizzy spells, no syncope  Respiratory:  + shortness of breath, no home oxygen, no productive cough, + intermittent dry cough, no bronchitis, no wheezing, no hemoptysis, no asthma, no pain with inspiration or cough, no sleep apnea, no CPAP at night  GI:   no difficulty swallowing, no reflux, no frequent heartburn, no hiatal hernia, no abdominal pain, no constipation, no diarrhea, no hematochezia, no hematemesis, no melena  GU:   no dysuria,  no frequency, no urinary tract infection, no hematuria, no kidney stones, no kidney disease  Vascular:  no pain suggestive of claudication, no pain in feet, no leg cramps, no varicose veins, no DVT, no non-healing foot ulcer  Neuro:   no stroke, no TIA's, no seizures, no headaches, no temporary blindness one eye,  no slurred speech, no peripheral neuropathy, no chronic pain, no instability of gait, no memory/cognitive dysfunction  Musculoskeletal: no arthritis, no joint swelling, no myalgias, no difficulty walking, somewhat limited mobility due to poor exercise tolerance  Skin:   no rash, no itching, no skin infections, no pressure sores or ulcerations  Psych:   no anxiety, no depression, no nervousness, no unusual recent stress  Eyes:   no blurry vision, no floaters, no recent vision changes, + wears glasses or contacts  ENT:   no hearing loss, no loose or painful teeth, no dentures, last saw dentist June 2019  Hematologic:  no easy bruising, no abnormal bleeding, no clotting disorder, no frequent epistaxis  Endocrine:  no diabetes, does not check CBG's at home           Physical Exam:   BP 115/72   Pulse 73   Resp 20   Ht 5\' 3"  (1.6 m)   Wt 201 lb (91.2 kg)   SpO2 93% Comment: RA  BMI 35.61 kg/m   General:  Morbidly obese,  well-appearing  HEENT:  Unremarkable   Neck:   no JVD, no bruits, no adenopathy   Chest:   clear to auscultation, symmetrical breath sounds, no  wheezes, no rhonchi   CV:   RRR, grade III/VI systolic murmur heard best at apex,  no diastolic murmur  Abdomen:  soft, non-tender, no masses   Extremities:  warm, well-perfused, pulses not palpable, no LE edema  Rectal/GU  Deferred  Neuro:   Grossly  non-focal and symmetrical throughout  Skin:   Clean and dry, no rashes, no breakdown   Diagnostic Tests:  Transthoracic Echocardiography  Patient:    Vanessa [name], Vanessa [name] MR #:       [id number] Study Date: 10/03/2018 Gender:     F Age:        59 Height:     157.5 cm Weight:     96.3 kg BSA:        1.96 m^2 Pt. Status: Room:   ATTENDING  Default, Provider [phone]  Elenore Paddy, M.D.  REFERRING  [name], M.D.  REFERRING  [place], Barth Kirks [phone]  cc:  ------------------------------------------------------------------- LV EF: 30% -   35%  ------------------------------------------------------------------- Study Conclusions  - Left ventricle: The cavity size was mildly dilated. Wall   thickness was normal. Systolic function was moderately to   severely reduced. The estimated ejection fraction was in the   range of 30% to 35%. Features are consistent with a pseudonormal   left ventricular filling pattern, with concomitant abnormal   relaxation and increased filling pressure (grade 2 diastolic   dysfunction). - Aortic valve: There was no stenosis. - Mitral valve: There was probably severe regurgitation with some   restriction of posterior leaflet. - Left atrium: The atrium was severely dilated. - Right ventricle: The cavity size was normal. Systolic function   was mildly to moderately reduced. - Tricuspid valve: Peak RV-RA gradient (S): 42 mm Hg. - Pulmonary arteries: PA peak pressure: 50 mm Hg (S). - Systemic veins: IVC measured 1.8 cm with < 50% respirophasic   variation suggesting RA pressure 8 mmHg. - Pericardium, extracardiac: A trivial pericardial effusion was   identified.  Impressions:  -  Mildly dilated LV with EF 30-35%, diffuse hypokinesis. Normal RV   size with mild to moderately decreased systolic function.   Probably severe mitral regurgitation with some restriction of   posterior leaflet noted. Moderate pulmonary hypertension.  ------------------------------------------------------------------- Study data:   Study status:  Routine.  Procedure:  Transthoracic echocardiography. Image quality was adequate. Transthoracic echocardiography.  M-mode, complete 2D, spectral Doppler, and color Doppler.  Birthdate:  Patient birthdate: 03-27-1951.  Age:  Patient is 67 yr old.  Sex:  Gender: female. BMI: 38.8 kg/m^2.  Patient status:  Inpatient.  Study date:  Study date: 10/03/2018. Study time: 08:15 AM.  -------------------------------------------------------------------  ------------------------------------------------------------------- Left ventricle:  The cavity size was mildly dilated. Wall thickness was normal. Systolic function was moderately to severely reduced. The estimated ejection fraction was in the range of 30% to 35%. Features are consistent with a pseudonormal left ventricular filling pattern, with concomitant abnormal relaxation and increased filling pressure (grade 2 diastolic dysfunction).  ------------------------------------------------------------------- Aortic valve:   Trileaflet.  Doppler:   There was no stenosis. There was no regurgitation.  ------------------------------------------------------------------- Aorta:  Aortic root: The aortic root was normal in size. Ascending aorta: The ascending aorta was normal in size.  ------------------------------------------------------------------- Mitral valve:   Normal thickness leaflets .  Doppler:   There was no evidence for stenosis.   There was probably severe regurgitation with some restriction of posterior leaflet.    Peak gradient (D): 6 mm  Hg.  ------------------------------------------------------------------- Left atrium:  The atrium was severely dilated.  ------------------------------------------------------------------- Right ventricle:  The cavity size was normal. Systolic function was mildly to moderately reduced.  ------------------------------------------------------------------- Pulmonic valve:    Doppler:  There was trivial regurgitation.  ------------------------------------------------------------------- Tricuspid valve:   Doppler:  There was trivial regurgitation.   ------------------------------------------------------------------- Right atrium:  The atrium was normal in size.  ------------------------------------------------------------------- Pericardium:  A trivial pericardial effusion was identified.  ------------------------------------------------------------------- Systemic veins:  IVC measured 1.8 cm with < 50% respirophasic variation suggesting RA pressure 8 mmHg.  ------------------------------------------------------------------- Measurements   Left ventricle                           Value        Reference  LV ID, ED, PLAX chordal          (H)     64    mm     43 - 52  LV ID, ES, PLAX chordal          (H)     56    mm     23 - 38  LV fx shortening, PLAX chordal   (L)     13    %      >=29  LV PW thickness, ED                      10    mm     ----------  IVS/LV PW ratio, ED                      0.9          <=1.3  Stroke volume, 2D                        42    ml     ----------  Stroke volume/bsa, 2D                    21    ml/m^2 ----------  LV ejection fraction, 1-p A4C            37    %      ----------  LV end-diastolic volume, 2-p             167   ml     ----------  LV end-systolic volume, 2-p              105   ml     ----------  LV ejection fraction, 2-p                37    %      ----------  Stroke volume, 2-p                       62    ml     ----------  LV  end-diastolic volume/bsa, 2-p         85    ml/m^2 ----------  LV end-systolic volume/bsa, 2-p          54    ml/m^2 ----------  Stroke volume/bsa, 2-p                   31.6  ml/m^2 ----------  LV e&', lateral                           15.9  cm/s   ----------  LV E/e&', lateral                         7.86         ----------  LV e&', medial  7.62  cm/s   ----------  LV E/e&', medial                          16.4         ----------  LV e&', average                           11.76 cm/s   ----------  LV E/e&', average                         10.63        ----------    Ventricular septum                       Value        Reference  IVS thickness, ED                        9     mm     ----------    LVOT                                     Value        Reference  LVOT ID, S                               19    mm     ----------  LVOT area                                2.84  cm^2   ----------  LVOT peak velocity, S                    80.3  cm/s   ----------  LVOT mean velocity, S                    53.5  cm/s   ----------  LVOT VTI, S                              14.9  cm     ----------    Aorta                                    Value        Reference  Aortic root ID, ED                       24    mm     ----------    Left atrium                              Value        Reference  LA ID, A-P, ES                           43    mm     ----------  LA ID/bsa, A-P  2.19  cm/m^2 <=2.2  LA volume, S                             142   ml     ----------  LA volume/bsa, S                         72.4  ml/m^2 ----------  LA volume, ES, 1-p A4C                   156   ml     ----------  LA volume/bsa, ES, 1-p A4C               79.6  ml/m^2 ----------  LA volume, ES, 1-p A2C                   124   ml     ----------  LA volume/bsa, ES, 1-p A2C               63.3  ml/m^2 ----------    Mitral valve                             Value         Reference  Mitral E-wave peak velocity              125   cm/s   ----------  Mitral A-wave peak velocity              96.8  cm/s   ----------  Mitral deceleration time                 151   ms     150 - 230  Mitral peak gradient, D                  6     mm Hg  ----------  Mitral E/A ratio, peak                   1.3          ----------  Mitral regurg VTI, PISA                  188   cm     ----------  Mitral ERO, PISA                         0.15  cm^2   ----------  Mitral regurg volume, PISA               28    ml     ----------    Pulmonary arteries                       Value        Reference  PA pressure, S, DP               (H)     50    mm Hg  <=30    Tricuspid valve                          Value        Reference  Tricuspid regurg peak velocity           324   cm/s   ----------  Tricuspid peak RV-RA gradient            42    mm Hg  ----------    Right atrium                             Value        Reference  RA ID, S-I, ES, A4C              (H)     49.2  mm     34 - 49  RA area, ES, A4C                         15.5  cm^2   8.3 - 19.5  RA volume, ES, A/L                       41.4  ml     ----------  RA volume/bsa, ES, A/L                   21.1  ml/m^2 ----------    Right ventricle                          Value        Reference  TAPSE                                    18    mm     ----------  RV s&', lateral, S                        8.49  cm/s   ----------    Pulmonic valve                           Value        Reference  Pulmonic regurg velocity, ED             178   cm/s   ----------  Pulmonic regurg gradient, ED             13    mm Hg  ----------  Legend: (L)  and  (H)  mark values outside specified reference range.  ------------------------------------------------------------------- Prepared and Electronically Authenticated by  [name], M.D. 2019-11-04T09:11:57   Transesophageal Echocardiography  Patient:    Shuna, Tabor MR #:        [id number] Study Date: 10/11/2018 Gender:     F Age:        57 Height:     157.5 cm Weight:     94.9 kg BSA:        2.09 m^2 Pt. Status: Room:   ATTENDING    [name], M.D.  ORDERING     [name], M.D.  PERFORMING   [name], M.D.  REFERRING    [name], M.D.  SONOGRAPHER  Dance, Tiffany  cc:  ------------------------------------------------------------------- LV EF: 30% -   35%  ------------------------------------------------------------------- Indications:      Mitral regurgitation 424.0.  ------------------------------------------------------------------- History:   PMH:   Primary pulmonary hypertension.  PMH: Dysrhythmia. Nonischemic Cardiomyopathy.  Risk factors: Hypertension.  ------------------------------------------------------------------- Study Conclusions  - Left ventricle: The cavity size was moderately dilated. Wall   thickness was normal.  Systolic function was moderately to   severely reduced. The estimated ejection fraction was in the   range of 30% to 35%. Diffuse hypokinesis. - Aortic valve: There was no stenosis. - Aorta: Normal caliber thoracic aorta with minimal plaque. - Mitral valve: There was restriction of the posterior mitral   leaflet and incomplete coaptation of the anterior and posterior   leaflets with eccentric, posteriorly-directed mitral   regurgitation. The mitral regurgitation appeared severe. There   was systolic flow reversal in one out of two pulmonary veins   interrogated. PISA ERO 0.41 cm^2. No mitral stenosis. - Left atrium: The atrium was severely dilated. No evidence of   thrombus in the atrial cavity or appendage. - Right ventricle: The cavity size was normal. Systolic function   was mildly to moderately reduced. - Right atrium: The atrium was mildly dilated. - Atrial septum: No ASD/PFO by color doppler. - Tricuspid valve: Peak RV-RA gradient (S): 50 mm Hg.  Impressions:  - Severe, eccentric  mitral regurgitation with restriction of   posterior leaflet and poor coaptation. Will refer for Mitraclip   evaluation.  ------------------------------------------------------------------- Study data:   Study status:  Routine.  Consent:  The risks, benefits, and alternatives to the procedure were explained to the patient and informed consent was obtained.  Procedure:  Initial setup. The patient was brought to the laboratory. Surface ECG leads were monitored. Sedation. Moderate sedation with intermittent deep sedation was administered by cardiology staff. Transesophageal echocardiography. An adult multiplane transesophageal probe was inserted by the attending cardiologistwithout difficulty. 3D image quality was adequate.  Study completion:  The patient tolerated the procedure well. There were no complications.  Administered medications:   Fentanyl, 64mcg, IV.  Midazolam, 3mg , IV. Diagnostic transesophageal echocardiography.  2D and color Doppler.  Birthdate:  Patient birthdate: 1951/09/04.  Age:  Patient is 67 yr old.  Sex:  Gender: female.    BMI: 38.3 kg/m^2.  Blood pressure:   103/66  Patient status:  Outpatient.  Study date:  Study date: 10/11/2018. Study time: 10:21 AM.  Location:  Endoscopy.  -------------------------------------------------------------------  ------------------------------------------------------------------- Left ventricle:  The cavity size was moderately dilated. Wall thickness was normal. Systolic function was moderately to severely reduced. The estimated ejection fraction was in the range of 30% to 35%. Diffuse hypokinesis.  ------------------------------------------------------------------- Aortic valve:   Trileaflet.  Doppler:   There was no stenosis. There was no regurgitation.  ------------------------------------------------------------------- Aorta:  Normal caliber thoracic aorta with minimal  plaque.  ------------------------------------------------------------------- Mitral valve:  There was restriction of the posterior mitral leaflet and incomplete coaptation of the anterior and posterior leaflets with eccentric, posteriorly-directed mitral regurgitation. The mitral regurgitation appeared severe. There was systolic flow reversal in one out of two pulmonary veins interrogated. PISA ERO 0.41 cm^2. No mitral stenosis.  ------------------------------------------------------------------- Left atrium:  The atrium was severely dilated.  No evidence of thrombus in the atrial cavity or appendage.  ------------------------------------------------------------------- Atrial septum:  No ASD/PFO by color doppler.  ------------------------------------------------------------------- Right ventricle:  The cavity size was normal. Systolic function was mildly to moderately reduced.  ------------------------------------------------------------------- Pulmonic valve:    Structurally normal valve.   Cusp separation was normal.  ------------------------------------------------------------------- Tricuspid valve:   Doppler:  There was mild regurgitation.  ------------------------------------------------------------------- Right atrium:  The atrium was mildly dilated.  ------------------------------------------------------------------- Pericardium:  There was no pericardial effusion.   ------------------------------------------------------------------- Post procedure conclusions Ascending Aorta:  - Normal caliber thoracic aorta with minimal plaque.  ------------------------------------------------------------------- Measurements   Left ventricle  Value       Reference  LV ID, ED, PLAX chordal              (H)   66.6  mm    43 - 52  LV PW thickness, ED                        9.22  mm    ---------    Mitral valve                                Value       Reference  Mitral maximal regurg velocity, PISA       689   cm/s  ---------  Mitral regurg VTI, PISA                    218   cm    ---------  Mitral ERO, PISA                           0.41  cm^2  ---------  Mitral regurg volume, PISA                 89    ml    ---------    Tricuspid valve                            Value       Reference  Tricuspid regurg peak velocity             352   cm/s  ---------  Tricuspid peak RV-RA gradient              50    mm Hg ---------  Legend: (L)  and  (H)  mark values outside specified reference range.  ------------------------------------------------------------------- Prepared and Electronically Authenticated by  [name], M.D. 2019-11-12T15:29:39   Impression:  Patient has a long history of dilated nonischemic cardiomyopathy and chronic systolic congestive heart failure with stage D severe symptomatic mitral regurgitation that has persisted despite optimal medical therapy.  She currently describes stable symptoms of exertional shortness of breath and fatigue consistent with New York Heart Association functional class II symptoms of congestive heart failure.  Previous EKGs have revealed sinus rhythm without significant AV conduction delay and echocardiograms do not reveal significant wall motion abnormalities nor dyssynchrony to suggest that the patient might benefit from CRT.  I have personally reviewed the patient's recent transthoracic and transesophageal echocardiograms.  There is severe global left ventricular systolic dysfunction with mild to moderate left ventricular chamber enlargement.  Left ventricular end-diastolic and end-systolic dimensions were recently measured 64 and 56 mm, respectively.  Functional anatomy of the patient's mitral regurgitation appears to be likely related to annular dilatation (Type I) and functional restriction of the posterior leaflet (Type IIIB).  The leaflets do not appear to be significantly  thickened.  There is no thickening or effusion of the subvalvular apparatus.  There does not appear to be significant annular calcification.    I agree the patient might benefit from elective mitral valve repair or replacement.  It is possible that simple ring annuloplasty could eliminate the patient's mitral regurgitation, although it might be associated with risking development of significant mitral stenosis and/or late recurrence of mitral regurgitation.  Mitral valve replacement might provide more durable long-term result  than ring annuloplasty, but it would likely be associated with increased perioperative risk and need for long term anticoagulation.  However, despite the proven long term efficacy of mitral valve replacement, I would be somewhat reluctant to consider this patient a candidate for conventional surgery because of the severity of her left ventricular systolic dysfunction, pulmonary hypertension, morbid obesity, and right ventricular dysfunction.    Plan:  The patient was counseled at length regarding treatment alternatives for management of severe symptomatic mitral regurgitation. Alternative approaches such as conventional surgical mitral valve repair or replacement, percutaneous edge-to-edge Mitraclip repair, and continued medical therapy without intervention were compared and contrasted at length.  The risks associated with conventional surgical mitral valve repair or replacement were discussed in detail, as were expectations for post-operative convalescence, and why I would be reluctant to consider this patient a candidate for conventional surgery.   At this point the patient is interested in proceeding with Mitraclip repair.  She will follow-up with Dr. Burt Knack and Dr. Aundra Dubin in the near future.  All of her questions have been addressed.   I spent in excess of 90 minutes during the conduct of this office consultation and >50% of this time involved direct face-to-face encounter with  the patient for counseling and/or coordination of their care.    Valentina Gu. Roxy Manns, MD 11/07/2018 4:09 PM

## 2018-11-07 NOTE — Patient Instructions (Signed)
Continue all previous medications without any changes at this time  

## 2018-11-28 ENCOUNTER — Ambulatory Visit (HOSPITAL_COMMUNITY)
Admission: RE | Admit: 2018-11-28 | Discharge: 2018-11-28 | Disposition: A | Discharge status: Home / Self Care | Payer: PPO | Source: Ambulatory Visit | Attending: Cardiology | Admitting: Cardiology

## 2018-11-28 VITALS — BP 122/78 | HR 81 | Wt 207.6 lb

## 2018-11-28 DIAGNOSIS — Z79899 Other long term (current) drug therapy: Secondary | ICD-10-CM | POA: Insufficient documentation

## 2018-11-28 DIAGNOSIS — I428 Other cardiomyopathies: Secondary | ICD-10-CM | POA: Insufficient documentation

## 2018-11-28 DIAGNOSIS — I5022 Chronic systolic (congestive) heart failure: Secondary | ICD-10-CM | POA: Diagnosis not present

## 2018-11-28 DIAGNOSIS — N183 Chronic kidney disease, stage 3 (moderate): Secondary | ICD-10-CM | POA: Insufficient documentation

## 2018-11-28 DIAGNOSIS — I340 Nonrheumatic mitral (valve) insufficiency: Secondary | ICD-10-CM | POA: Insufficient documentation

## 2018-11-28 DIAGNOSIS — I493 Ventricular premature depolarization: Secondary | ICD-10-CM

## 2018-11-28 DIAGNOSIS — Z7982 Long term (current) use of aspirin: Secondary | ICD-10-CM | POA: Insufficient documentation

## 2018-11-28 DIAGNOSIS — I272 Pulmonary hypertension, unspecified: Secondary | ICD-10-CM | POA: Diagnosis not present

## 2018-11-28 LAB — BASIC METABOLIC PANEL
Anion gap: 10 (ref 5–15)
BUN: 29 mg/dL — AB (ref 8–23)
CO2: 26 mmol/L (ref 22–32)
CREATININE: 1.33 mg/dL — AB (ref 0.44–1.00)
Calcium: 9.1 mg/dL (ref 8.9–10.3)
Chloride: 106 mmol/L (ref 98–111)
GFR calc Af Amer: 48 mL/min — ABNORMAL LOW (ref >60)
GFR, EST NON AFRICAN AMERICAN: 41 mL/min — AB (ref >60)
Glucose, Bld: 101 mg/dL — ABNORMAL HIGH (ref 70–99)
Potassium: 4.5 mmol/L (ref 3.5–5.1)
SODIUM: 142 mmol/L (ref 135–145)

## 2018-11-28 MED ORDER — FUROSEMIDE 40 MG PO TABS: ORAL_TABLET | ORAL | 6 refills | Status: DC

## 2018-11-28 MED ORDER — FUROSEMIDE 40 MG PO TABS: ORAL_TABLET | ORAL | 3 refills | Status: DC

## 2018-11-28 MED ORDER — SACUBITRIL-VALSARTAN 49-51 MG PO TABS: 1.0000 | ORAL_TABLET | Freq: Two times a day (BID) | ORAL | 3 refills | Status: DC

## 2018-11-28 NOTE — H&P (View-Only) (Signed)
Patient ID: Vanessa [name], female   DOB: 28-Sep-1951, 67 y.o.   MRN: [id number]    Advanced Heart Failure Clinic Note   PCP: Lennie Odor Primary Cardiologist: Dr. Radford Pax HF Cardiology: Dr. Aundra Dubin  67 y.o. with history of nonischemic cardiomyopathy presents for followup of CHF. Patient has had a low EF recognized since at least 9/14, when echo showed EF 40-45%.  In 07/12/23, her child died, which led to significant stress.  She developed worsening exertional dyspnea and subsequent echo showed a fall in EF to 25-30%.  She had left and right heart catheterization in 3/16 with no significant coronary disease detected and moderately elevated right and left heart filling pressures along with pulmonary venous hypertension.  Echo was done 12/16: EF 45%, diffuse hypokinesis. She had a repeat echo 11/17 => EF 45% with moderately dilated LV, moderate MR.  Echo 11/18 showed EF stable at 45% with mild LV dilation and mildly decreased RV systolic function, moderate MR.   Spironolactone was stopped due to elevated K. She apparently did not tolerate Veltassa well.   Echo was done in 11/19.  EF 30-35% with mild LV hypertrophy, mild-moderately decreased RV systolic function, possible severe MR with restricted posterior leaflet, PASP 50 mmHg, severe LAE.  TEE was done in 11/19 to assess mitral regurgitation.  This showed severe MR with restricted posterior leaflet.   She has been seen by Drs Burt Knack and Roxy Manns as part of Mitraclip evaluation.  From the notes, it appears that she would be a reasonable candidate but procedure has not yet been scheduled.   She returns for followup of CHF.  Her weight is up today by 6 lbs.  She is short of breath walking up stairs or inclines.  She is short of breath walking "long distances" on flat ground. No chest pain.  No lightheadedness.   Labs (7/16): K 4.5, creatinine 1.12, BNP 596 Labs (8/16): K 4 => 5.1, creatinine 1.29 => 1.5, BNP 388 => 426 Labs (9/16): K 4, creatinine 1.08, BNP  502 Labs (12/16): K 4.9, creatinine 1.57 Labs (5/17): K 5.2, creatinine 1.3 Labs (8/17): BNP 152 Labs (11/17): K 4.9, creatinine 1.32, BNP 236 Labs (8/18): K 4.6, creatinine 1.57 Labs (11/18): K 3.8, creatinine 1.29 Labs (2/19): K 5.1, creatinine 1.44 Labs (9/19): K 3.8, creatinine 1.08 Labs (11/19): K 4.3, creatinine 1.3  PMH: 1. Cardiomyopathy: Nonischemic.  Echo (9/14) with EF 40-45%.  Echo (2/16) with EF 25-30%.  Echo (4/16) with EF 30-35%, moderate MR.  Echo (6/16) with EF 40%, moderate LV dilation, moderate diastolic dysfunction, moderate MR, moderate TR, PA systolic pressure 52 mmHg. RHC/LHC (3/16) with no significant CAD, EF 20-25%, 3+ MR; mean RA 13, PA 70/30 mean 45, mean PCWP 31 mmHg, CI 2.2, PVR 3.1 WU.  Echo (12/16) with EF 45%, diffuse hypokinesis, moderate MR, normal RV size and systolic function.  - Echo (11/17): EF 45%, moderately dilated LV, moderate eccentric posterior MR, PASP 42 mmHg, normal RV size and systolic function.  - Echo (11/18): EF 45%, mild LV dilation, moderate diastolic dysfunction, normal RV size with mildly decreased systolic function, PASP 50 mmHg, moderate MR.  - Echo (11/19): EF 30-35%, mild LV dilation, severe LAE, mild-moderately decreased RV systolic function, possible severe MR with restricted posterior leaflet, PASP 50 mmHg.  2. PVCs: Holter (10/15) with 12% PVCs. 3. Pulmonary venous hypertension: CTA chest (7/16) negative for PE. PFTs (7/16) with minimal obstruction, mild restriction, moderately decreased DLCO.  4. Thyroid nodules: Biopsy benign.  5. CKD  stage 3 6. Mitral regurgitation: TEE (11/19) with EF 30-35%, moderate LV dilation, severe MR with posterior leaflet restriction and ERO 0.41 cm^2, no mitral stenosis, mild to moderately decreased RV systolic function, peak RV-RA gradient 50 mmHg.   SH: Lives alone, only child passed away in 06/25/2023, nonsmoker, no ETOH or drugs.   FH: Father with MIs  ROS: All systems reviewed and negative except as  per HPI.   Current Outpatient Medications  Medication Sig Dispense Refill  . aspirin 81 MG EC tablet Take 1 tablet (81 mg total) by mouth daily.    . Calcium Carbonate-Vitamin D (CALCIUM 600+D PO) Take 1 tablet by mouth daily.    . carvedilol (COREG) 25 MG tablet Take 1 tablet (25 mg total) by mouth 2 (two) times daily with a meal. 180 tablet 2  . furosemide (LASIX) 40 MG tablet 40 mg in the morning and 20 mg in the pm 270 tablet 3  . ibandronate (BONIVA) 150 MG tablet Take 150 mg every 30 (thirty) days by mouth. Take in the morning with a full glass of water, on an empty stomach, and do not take anything else by mouth or lie down for the next 30 min.    Marland Kitchen spironolactone (ALDACTONE) 25 MG tablet Take 0.5 tablets (12.5 mg total) by mouth daily. 15 tablet 3  . sacubitril-valsartan (ENTRESTO) 49-51 MG Take 1 tablet by mouth 2 (two) times daily. 60 tablet 3   No current facility-administered medications for this encounter.    BP 122/78   Pulse 81   Wt 94.2 kg (207 lb 9.6 oz)   SpO2 96%   BMI 36.77 kg/m    Wt Readings from Last 3 Encounters:  11/28/18 94.2 kg (207 lb 9.6 oz)  11/07/18 91.2 kg (201 lb)  10/20/18 91.7 kg (202 lb 1.9 oz)   General: NAD Neck: JVP 10-11 cm, no thyromegaly or thyroid nodule.  Lungs: Clear to auscultation bilaterally with normal respiratory effort. CV: Lateral PMI.  Heart regular S1/S2, no S3/S4, 2/6 HSM apex.  No peripheral edema.  No carotid bruit.  Normal pedal pulses.  Abdomen: Soft, nontender, no hepatosplenomegaly, no distention.  Skin: Intact without lesions or rashes.  Neurologic: Alert and oriented x 3.  Psych: Normal affect. Extremities: No clubbing or cyanosis.  HEENT: Normal.   Assessment/Plan: 1. Chronic systolic CHF: Nonischemic cardiomyopathy.  EF up to 45% on 12/16, repeat echo 11/18 shows that EF remains about 45%.  Etiology of NICM still unclear. Had frequent PVCs by past holter (12%) but doesn't explain her cardiomyopathy. Had improvement  of palpitations on Coreg. Cannot rule out myocarditis. May also have component of Takotsubo with passing of child but EF has not improved as completely as would be expected with Takotsubo. Have previously discussed cardiac MRI to look for infiltrative disease but she says that she would be too claustrophobic.  Echo in 11/19 showed EF down to 30-35% and mild to moderate RV systolic function.  There also appeared to be severe mitral regurgitation, which seemed progressive from the past. TEE in 11/19 confirmed severe MR with restricted posterior leaflet. She is still volume overloaded today with NYHA class III symptoms.  - Increase Lasix to 40 mg bid x 3 days then 40 mg qam/20 qpm long-term (taking Lasix 40 mg daily currently).    - Continue spironolactone 12.5 mg daily.  - Continue Coreg 25 mg bid.  - Increase Entresto to 49/51 bid, BMET today and again in 10 days.   - If  K rises again, she did not tolerate Veltassa so would try Lokelma.  2. PVCs: Frequent (12%) on 10/15 holter.   Pt refused repeat holter monitor for further assessment. PVC count was probably not high enough to cause cardiomyopathy, unlikely primary etiology. - Improved palpitations on Coreg.   3. Pulmonary hypertension: Primarily pulmonary venous HTN (PVR 3.1) from elevated left atrial pressure (previous RHC above).   - Best treatment remains adequate diuresis. 4. Mitral regurgitation:  TEE in 11/19 with severe MR, restricted posterior leaflet.  I suspect that progressive mitral regurgitation has played a role in her symptomatic worsening over the last few months and possibly also the fall in EF. She has been evaluated for Mitraclip and appears to be a reasonable candidate.  - I will contact the structural heart team to find out plan for Mitraclip scheduling.   Followup in 1 month with NP Ninfa Meeker   [name]  11/28/2018

## 2018-11-28 NOTE — Progress Notes (Signed)
Patient ID: Vanessa [name], female   DOB: September 09, 1951, 67 y.o.   MRN: [id number]    Advanced Heart Failure Clinic Note   PCP: Lennie Odor Primary Cardiologist: Dr. Radford Pax HF Cardiology: Dr. Aundra Dubin  67 y.o. with history of nonischemic cardiomyopathy presents for followup of CHF. Patient has had a low EF recognized since at least 9/14, when echo showed EF 40-45%.  In 06-22-2023, her child died, which led to significant stress.  She developed worsening exertional dyspnea and subsequent echo showed a fall in EF to 25-30%.  She had left and right heart catheterization in 3/16 with no significant coronary disease detected and moderately elevated right and left heart filling pressures along with pulmonary venous hypertension.  Echo was done 12/16: EF 45%, diffuse hypokinesis. She had a repeat echo 11/17 => EF 45% with moderately dilated LV, moderate MR.  Echo 11/18 showed EF stable at 45% with mild LV dilation and mildly decreased RV systolic function, moderate MR.   Spironolactone was stopped due to elevated K. She apparently did not tolerate Veltassa well.   Echo was done in 11/19.  EF 30-35% with mild LV hypertrophy, mild-moderately decreased RV systolic function, possible severe MR with restricted posterior leaflet, PASP 50 mmHg, severe LAE.  TEE was done in 11/19 to assess mitral regurgitation.  This showed severe MR with restricted posterior leaflet.   She has been seen by Drs Burt Knack and Roxy Manns as part of Mitraclip evaluation.  From the notes, it appears that she would be a reasonable candidate but procedure has not yet been scheduled.   She returns for followup of CHF.  Her weight is up today by 6 lbs.  She is short of breath walking up stairs or inclines.  She is short of breath walking "long distances" on flat ground. No chest pain.  No lightheadedness.   Labs (7/16): K 4.5, creatinine 1.12, BNP 596 Labs (8/16): K 4 => 5.1, creatinine 1.29 => 1.5, BNP 388 => 426 Labs (9/16): K 4, creatinine 1.08, BNP  502 Labs (12/16): K 4.9, creatinine 1.57 Labs (5/17): K 5.2, creatinine 1.3 Labs (8/17): BNP 152 Labs (11/17): K 4.9, creatinine 1.32, BNP 236 Labs (8/18): K 4.6, creatinine 1.57 Labs (11/18): K 3.8, creatinine 1.29 Labs (2/19): K 5.1, creatinine 1.44 Labs (9/19): K 3.8, creatinine 1.08 Labs (11/19): K 4.3, creatinine 1.3  PMH: 1. Cardiomyopathy: Nonischemic.  Echo (9/14) with EF 40-45%.  Echo (2/16) with EF 25-30%.  Echo (4/16) with EF 30-35%, moderate MR.  Echo (6/16) with EF 40%, moderate LV dilation, moderate diastolic dysfunction, moderate MR, moderate TR, PA systolic pressure 52 mmHg. RHC/LHC (3/16) with no significant CAD, EF 20-25%, 3+ MR; mean RA 13, PA 70/30 mean 45, mean PCWP 31 mmHg, CI 2.2, PVR 3.1 WU.  Echo (12/16) with EF 45%, diffuse hypokinesis, moderate MR, normal RV size and systolic function.  - Echo (11/17): EF 45%, moderately dilated LV, moderate eccentric posterior MR, PASP 42 mmHg, normal RV size and systolic function.  - Echo (11/18): EF 45%, mild LV dilation, moderate diastolic dysfunction, normal RV size with mildly decreased systolic function, PASP 50 mmHg, moderate MR.  - Echo (11/19): EF 30-35%, mild LV dilation, severe LAE, mild-moderately decreased RV systolic function, possible severe MR with restricted posterior leaflet, PASP 50 mmHg.  2. PVCs: Holter (10/15) with 12% PVCs. 3. Pulmonary venous hypertension: CTA chest (7/16) negative for PE. PFTs (7/16) with minimal obstruction, mild restriction, moderately decreased DLCO.  4. Thyroid nodules: Biopsy benign.  5. CKD  stage 3 6. Mitral regurgitation: TEE (11/19) with EF 30-35%, moderate LV dilation, severe MR with posterior leaflet restriction and ERO 0.41 cm^2, no mitral stenosis, mild to moderately decreased RV systolic function, peak RV-RA gradient 50 mmHg.   SH: Lives alone, only child passed away in June 15, 2023, nonsmoker, no ETOH or drugs.   FH: Father with MIs  ROS: All systems reviewed and negative except as  per HPI.   Current Outpatient Medications  Medication Sig Dispense Refill  . aspirin 81 MG EC tablet Take 1 tablet (81 mg total) by mouth daily.    . Calcium Carbonate-Vitamin D (CALCIUM 600+D PO) Take 1 tablet by mouth daily.    . carvedilol (COREG) 25 MG tablet Take 1 tablet (25 mg total) by mouth 2 (two) times daily with a meal. 180 tablet 2  . furosemide (LASIX) 40 MG tablet 40 mg in the morning and 20 mg in the pm 270 tablet 3  . ibandronate (BONIVA) 150 MG tablet Take 150 mg every 30 (thirty) days by mouth. Take in the morning with a full glass of water, on an empty stomach, and do not take anything else by mouth or lie down for the next 30 min.    Marland Kitchen spironolactone (ALDACTONE) 25 MG tablet Take 0.5 tablets (12.5 mg total) by mouth daily. 15 tablet 3  . sacubitril-valsartan (ENTRESTO) 49-51 MG Take 1 tablet by mouth 2 (two) times daily. 60 tablet 3   No current facility-administered medications for this encounter.    BP 122/78   Pulse 81   Wt 94.2 kg (207 lb 9.6 oz)   SpO2 96%   BMI 36.77 kg/m    Wt Readings from Last 3 Encounters:  11/28/18 94.2 kg (207 lb 9.6 oz)  11/07/18 91.2 kg (201 lb)  10/20/18 91.7 kg (202 lb 1.9 oz)   General: NAD Neck: JVP 10-11 cm, no thyromegaly or thyroid nodule.  Lungs: Clear to auscultation bilaterally with normal respiratory effort. CV: Lateral PMI.  Heart regular S1/S2, no S3/S4, 2/6 HSM apex.  No peripheral edema.  No carotid bruit.  Normal pedal pulses.  Abdomen: Soft, nontender, no hepatosplenomegaly, no distention.  Skin: Intact without lesions or rashes.  Neurologic: Alert and oriented x 3.  Psych: Normal affect. Extremities: No clubbing or cyanosis.  HEENT: Normal.   Assessment/Plan: 1. Chronic systolic CHF: Nonischemic cardiomyopathy.  EF up to 45% on 12/16, repeat echo 11/18 shows that EF remains about 45%.  Etiology of NICM still unclear. Had frequent PVCs by past holter (12%) but doesn't explain her cardiomyopathy. Had improvement  of palpitations on Coreg. Cannot rule out myocarditis. May also have component of Takotsubo with passing of child but EF has not improved as completely as would be expected with Takotsubo. Have previously discussed cardiac MRI to look for infiltrative disease but she says that she would be too claustrophobic.  Echo in 11/19 showed EF down to 30-35% and mild to moderate RV systolic function.  There also appeared to be severe mitral regurgitation, which seemed progressive from the past. TEE in 11/19 confirmed severe MR with restricted posterior leaflet. She is still volume overloaded today with NYHA class III symptoms.  - Increase Lasix to 40 mg bid x 3 days then 40 mg qam/20 qpm long-term (taking Lasix 40 mg daily currently).    - Continue spironolactone 12.5 mg daily.  - Continue Coreg 25 mg bid.  - Increase Entresto to 49/51 bid, BMET today and again in 10 days.   - If  K rises again, she did not tolerate Veltassa so would try Lokelma.  2. PVCs: Frequent (12%) on 10/15 holter.   Pt refused repeat holter monitor for further assessment. PVC count was probably not high enough to cause cardiomyopathy, unlikely primary etiology. - Improved palpitations on Coreg.   3. Pulmonary hypertension: Primarily pulmonary venous HTN (PVR 3.1) from elevated left atrial pressure (previous RHC above).   - Best treatment remains adequate diuresis. 4. Mitral regurgitation:  TEE in 11/19 with severe MR, restricted posterior leaflet.  I suspect that progressive mitral regurgitation has played a role in her symptomatic worsening over the last few months and possibly also the fall in EF. She has been evaluated for Mitraclip and appears to be a reasonable candidate.  - I will contact the structural heart team to find out plan for Mitraclip scheduling.   Followup in 1 month with NP Ninfa Meeker   [name]  11/28/2018

## 2018-11-28 NOTE — Patient Instructions (Signed)
Today you have been seen at the Heart failure clinic at [facility]   Medication changes: Increase Entresto 49/51 BID Increase Lasix 40 mg twice a day for 3 days          Then start Lasix 40 mg in the morning and 20 mg in the PM   you had Lab work done today:  Scheduled Lab work: in 10 days   We will call you if your lab work is abnormal.. No news is good news!!   Follow up with Amy in 1 month

## 2018-12-08 ENCOUNTER — Telehealth (HOSPITAL_COMMUNITY): Payer: Self-pay

## 2018-12-08 ENCOUNTER — Other Ambulatory Visit: Payer: Self-pay

## 2018-12-08 ENCOUNTER — Ambulatory Visit (HOSPITAL_COMMUNITY)
Admission: RE | Admit: 2018-12-08 | Discharge: 2018-12-08 | Disposition: A | Discharge status: Home / Self Care | Payer: PPO | Source: Ambulatory Visit | Attending: Internal Medicine | Admitting: Internal Medicine

## 2018-12-08 DIAGNOSIS — I5022 Chronic systolic (congestive) heart failure: Secondary | ICD-10-CM | POA: Diagnosis not present

## 2018-12-08 DIAGNOSIS — I340 Nonrheumatic mitral (valve) insufficiency: Secondary | ICD-10-CM

## 2018-12-08 LAB — BASIC METABOLIC PANEL
Anion gap: 8 (ref 5–15)
BUN: 43 mg/dL — ABNORMAL HIGH (ref 8–23)
CALCIUM: 9.5 mg/dL (ref 8.9–10.3)
CO2: 26 mmol/L (ref 22–32)
CREATININE: 1.8 mg/dL — AB (ref 0.44–1.00)
Chloride: 105 mmol/L (ref 98–111)
GFR calc non Af Amer: 29 mL/min — ABNORMAL LOW (ref >60)
GFR, EST AFRICAN AMERICAN: 33 mL/min — AB (ref >60)
GLUCOSE: 154 mg/dL — AB (ref 70–99)
Potassium: 4.4 mmol/L (ref 3.5–5.1)
Sodium: 139 mmol/L (ref 135–145)

## 2018-12-08 NOTE — Telephone Encounter (Signed)
Pt called with lab results no answer voice mail left for pt to call back Hold Lasix for a day and decrease back to 40 mg daily.  BMET 1 week.

## 2018-12-09 ENCOUNTER — Encounter: Payer: Self-pay | Admitting: Thoracic Surgery (Cardiothoracic Vascular Surgery)

## 2018-12-12 NOTE — Pre-Procedure Instructions (Signed)
Vanessa [name]  12/12/2018      [place], [place] - [address] [place] [address] Phone: [phone] Fax: [phone]    Your procedure is scheduled on Wednesday, January 15th.  Report to [facility] Admitting at 10:30 A.M.  Call this number if you have problems the morning of surgery:  [phone]   Remember:  Do not eat or drink after midnight.    Take these medicines the morning of surgery with A SIP OF WATER  carvedilol (COREG)  Follow your surgeon's instructions on when to stop Asprin.  If no instructions were given by your surgeon then you will need to call the office to get those instructions.    As of today, STOP taking any Aleve, Naproxen, Ibuprofen, Motrin, Advil, Goody's, BC's, all herbal medications, fish oil, and all vitamins.   Do not wear jewelry, make-up or nail polish.  Do not wear lotions, powders, or perfumes, or deodorant.  Do not shave 48 hours prior to surgery.  Men may shave face and neck.  Do not bring valuables to the hospital.  [facility] is not responsible for any belongings or valuables.  Contacts, eyeglasses, hearing aids, dentures or bridgework may not be worn into surgery.  Leave your suitcase in the car.  After surgery it may be brought to your room.  For patients admitted to the hospital, discharge time will be determined by your treatment team.  Patients discharged the day of surgery will not be allowed to drive home.   Special instructions:   [place]- Preparing For Surgery  Before surgery, you can play an important role. Because skin is not sterile, your skin needs to be as free of germs as possible. You can reduce the number of germs on your skin by washing with CHG (chlorahexidine gluconate) Soap before surgery.  CHG is an antiseptic cleaner which kills germs and bonds with the skin to continue killing germs even after washing.    Oral Hygiene is also  important to reduce your risk of infection.  Remember - BRUSH YOUR TEETH THE MORNING OF SURGERY WITH YOUR REGULAR TOOTHPASTE  Please do not use if you have an allergy to CHG or antibacterial soaps. If your skin becomes reddened/irritated stop using the CHG.  Do not shave (including legs and underarms) for at least 48 hours prior to first CHG shower. It is OK to shave your face.  Please follow these instructions carefully.   1. Shower the NIGHT BEFORE SURGERY and the MORNING OF SURGERY with CHG.   2. If you chose to wash your hair, wash your hair first as usual with your normal shampoo.  3. After you shampoo, rinse your hair and body thoroughly to remove the shampoo.  4. Use CHG as you would any other liquid soap. You can apply CHG directly to the skin and wash gently with a scrungie or a clean washcloth.   5. Apply the CHG Soap to your body ONLY FROM THE NECK DOWN.  Do not use on open wounds or open sores. Avoid contact with your eyes, ears, mouth and genitals (private parts). Wash Face and genitals (private parts)  with your normal soap.  6. Wash thoroughly, paying special attention to the area where your surgery will be performed.  7. Thoroughly rinse your body with warm water from the neck down.  8. DO NOT shower/wash with your normal soap after using and rinsing off the  CHG Soap.  9. Pat yourself dry with a CLEAN TOWEL.  10. Wear CLEAN PAJAMAS to bed the night before surgery, wear comfortable clothes the morning of surgery  11. Place CLEAN SHEETS on your bed the night of your first shower and DO NOT SLEEP WITH PETS.    Day of Surgery:  Do not apply any deodorants/lotions.  Please wear clean clothes to the hospital/surgery center.   Remember to brush your teeth WITH YOUR REGULAR TOOTHPASTE.   Please read over the following fact sheets that you were given.

## 2018-12-13 ENCOUNTER — Ambulatory Visit: Payer: PPO | Attending: Cardiovascular Disease | Admitting: Physical Therapy

## 2018-12-13 ENCOUNTER — Ambulatory Visit (HOSPITAL_COMMUNITY)
Admission: RE | Admit: 2018-12-13 | Discharge: 2018-12-13 | Disposition: A | Discharge status: Home / Self Care | Payer: PPO | Source: Ambulatory Visit | Attending: Cardiovascular Disease | Admitting: Cardiovascular Disease

## 2018-12-13 ENCOUNTER — Encounter (HOSPITAL_COMMUNITY)
Admission: RE | Admit: 2018-12-13 | Discharge: 2018-12-13 | Disposition: A | Discharge status: Home / Self Care | Payer: PPO | Source: Ambulatory Visit | Attending: Cardiovascular Disease | Admitting: Cardiovascular Disease

## 2018-12-13 ENCOUNTER — Other Ambulatory Visit: Payer: Self-pay | Admitting: Physician Assistant

## 2018-12-13 ENCOUNTER — Other Ambulatory Visit: Payer: Self-pay

## 2018-12-13 ENCOUNTER — Encounter: Payer: Self-pay | Admitting: Physical Therapy

## 2018-12-13 DIAGNOSIS — Z006 Encounter for examination for normal comparison and control in clinical research program: Secondary | ICD-10-CM | POA: Diagnosis not present

## 2018-12-13 DIAGNOSIS — Z9861 Coronary angioplasty status: Secondary | ICD-10-CM | POA: Diagnosis not present

## 2018-12-13 DIAGNOSIS — Z7982 Long term (current) use of aspirin: Secondary | ICD-10-CM | POA: Diagnosis not present

## 2018-12-13 DIAGNOSIS — I340 Nonrheumatic mitral (valve) insufficiency: Secondary | ICD-10-CM

## 2018-12-13 DIAGNOSIS — I509 Heart failure, unspecified: Secondary | ICD-10-CM | POA: Diagnosis not present

## 2018-12-13 DIAGNOSIS — Z01818 Encounter for other preprocedural examination: Secondary | ICD-10-CM | POA: Diagnosis not present

## 2018-12-13 DIAGNOSIS — I428 Other cardiomyopathies: Secondary | ICD-10-CM | POA: Diagnosis present

## 2018-12-13 DIAGNOSIS — E875 Hyperkalemia: Secondary | ICD-10-CM | POA: Diagnosis present

## 2018-12-13 DIAGNOSIS — E041 Nontoxic single thyroid nodule: Secondary | ICD-10-CM | POA: Diagnosis present

## 2018-12-13 DIAGNOSIS — Z79899 Other long term (current) drug therapy: Secondary | ICD-10-CM | POA: Diagnosis not present

## 2018-12-13 DIAGNOSIS — I5022 Chronic systolic (congestive) heart failure: Secondary | ICD-10-CM | POA: Diagnosis present

## 2018-12-13 DIAGNOSIS — I130 Hypertensive heart and chronic kidney disease with heart failure and stage 1 through stage 4 chronic kidney disease, or unspecified chronic kidney disease: Secondary | ICD-10-CM | POA: Diagnosis present

## 2018-12-13 DIAGNOSIS — I110 Hypertensive heart disease with heart failure: Secondary | ICD-10-CM | POA: Diagnosis not present

## 2018-12-13 DIAGNOSIS — M6281 Muscle weakness (generalized): Secondary | ICD-10-CM

## 2018-12-13 DIAGNOSIS — I272 Pulmonary hypertension, unspecified: Secondary | ICD-10-CM | POA: Diagnosis present

## 2018-12-13 DIAGNOSIS — N183 Chronic kidney disease, stage 3 (moderate): Secondary | ICD-10-CM | POA: Diagnosis present

## 2018-12-13 LAB — COMPREHENSIVE METABOLIC PANEL
ALT: 7 U/L (ref 0–44)
AST: 11 U/L — AB (ref 15–41)
Albumin: 3.6 g/dL (ref 3.5–5.0)
Alkaline Phosphatase: 33 U/L — ABNORMAL LOW (ref 38–126)
Anion gap: 7 (ref 5–15)
BUN: 42 mg/dL — ABNORMAL HIGH (ref 8–23)
CALCIUM: 9.2 mg/dL (ref 8.9–10.3)
CO2: 24 mmol/L (ref 22–32)
CREATININE: 1.65 mg/dL — AB (ref 0.44–1.00)
Chloride: 108 mmol/L (ref 98–111)
GFR calc Af Amer: 37 mL/min — ABNORMAL LOW (ref >60)
GFR calc non Af Amer: 32 mL/min — ABNORMAL LOW (ref >60)
Glucose, Bld: 79 mg/dL (ref 70–99)
Potassium: 4.4 mmol/L (ref 3.5–5.1)
Sodium: 139 mmol/L (ref 135–145)
Total Bilirubin: 2.3 mg/dL — ABNORMAL HIGH (ref 0.3–1.2)
Total Protein: 7.3 g/dL (ref 6.5–8.1)

## 2018-12-13 LAB — URINALYSIS, ROUTINE W REFLEX MICROSCOPIC
BILIRUBIN URINE: NEGATIVE
Glucose, UA: NEGATIVE mg/dL
Hgb urine dipstick: NEGATIVE
Ketones, ur: NEGATIVE mg/dL
Leukocytes, UA: NEGATIVE
Nitrite: NEGATIVE
Protein, ur: NEGATIVE mg/dL
Specific Gravity, Urine: 1.011 (ref 1.005–1.030)
pH: 5 (ref 5.0–8.0)

## 2018-12-13 LAB — CBC
HCT: 35.4 % — ABNORMAL LOW (ref 36.0–46.0)
HEMOGLOBIN: 10.6 g/dL — AB (ref 12.0–15.0)
MCH: 28 pg (ref 26.0–34.0)
MCHC: 29.9 g/dL — ABNORMAL LOW (ref 30.0–36.0)
MCV: 93.4 fL (ref 80.0–100.0)
NRBC: 0 % (ref 0.0–0.2)
Platelets: 226 10*3/uL (ref 150–400)
RBC: 3.79 MIL/uL — ABNORMAL LOW (ref 3.87–5.11)
RDW: 14.7 % (ref 11.5–15.5)
WBC: 5 10*3/uL (ref 4.0–10.5)

## 2018-12-13 LAB — PROTIME-INR
INR: 1.2
Prothrombin Time: 15.1 seconds (ref 11.4–15.2)

## 2018-12-13 LAB — BRAIN NATRIURETIC PEPTIDE: B Natriuretic Peptide: 618.4 pg/mL — ABNORMAL HIGH (ref 0.0–100.0)

## 2018-12-13 LAB — SURGICAL PCR SCREEN
MRSA, PCR: NEGATIVE
Staphylococcus aureus: NEGATIVE

## 2018-12-13 LAB — HEMOGLOBIN A1C
Hgb A1c MFr Bld: 6.2 % — ABNORMAL HIGH (ref 4.8–5.6)
Mean Plasma Glucose: 131.24 mg/dL

## 2018-12-13 LAB — TYPE AND SCREEN
ABO/RH(D): A POS
Antibody Screen: NEGATIVE

## 2018-12-13 LAB — ABO/RH: ABO/RH(D): A POS

## 2018-12-13 LAB — APTT: aPTT: 33 seconds (ref 24–36)

## 2018-12-13 NOTE — Progress Notes (Signed)
ABG was not able to obtain at PAT appointment by lab. Pt not able to tolerate. Will need to recollect on DOS.   Jacqlyn Larsen, RN

## 2018-12-13 NOTE — Therapy (Signed)
[place], [place], [address] Phone: [phone]   Fax:  [phone]  Physical Therapy Evaluation  Patient Details  Name: MANAR SMALLING MRN: [id number] Date of Birth: Aug 09, 1951 Referring Provider (PT): Talbot Grumbling MD   Encounter Date: 12/13/2018  PT End of Session - 12/13/18 1136    Visit Number  1    Number of Visits  1    Date for PT Re-Evaluation  12/13/18    PT Start Time  2440    PT Stop Time  1136    PT Time Calculation (min)  38 min    Activity Tolerance  Patient tolerated treatment well    Behavior During Therapy  [facility] for tasks assessed/performed       Past Medical History:  Diagnosis Date  . Dysrhythmia    PVC's  . Hypertension   . Moderate mitral regurgitation    moderate to severe MR with moderate pulmonary HTN  . Nonischemic cardiomyopathy ([place])    EF 30-35% by echo 2015  . Pulmonary HTN ([place])    moderate with PASP 39mmHg by echo 2015  . PVC's (premature ventricular contractions)     Past Surgical History:  Procedure Laterality Date  . CARDIAC CATHETERIZATION  2003   normal coronary arteries  . CARDIAC CATHETERIZATION  2013  Nov   h/o nonischemic DCM EF 35-40% increase LVEDP   . HYSTEROSCOPY W/D&C  12/02/2012   Procedure: DILATATION AND CURETTAGE /HYSTEROSCOPY;  Surgeon: Maeola Sarah. Landry Mellow, MD;  Location: [facility];  Service: Gynecology;  Laterality: N/A;  cervical polypectomy  . LEFT AND RIGHT HEART CATHETERIZATION WITH CORONARY ANGIOGRAM N/A 02/15/2015   Procedure: LEFT AND RIGHT HEART CATHETERIZATION WITH CORONARY ANGIOGRAM;  Surgeon: Jettie Booze, MD;  Location: [facility] CATH LAB;  Service: Cardiovascular;  Laterality: N/A;  . TEE WITHOUT CARDIOVERSION N/A 10/11/2018   Procedure: TRANSESOPHAGEAL ECHOCARDIOGRAM (TEE);  Surgeon: Larey Dresser, MD;  Location: [facility] ENDOSCOPY;  Service: Cardiovascular;  Laterality: N/A;    There were no vitals filed for this visit.   Subjective  Assessment - 12/13/18 1103    Subjective  pt is a 68 y.o with her CC of SOB with prolonged walking/ standing and going up/ down steps. she reports this has been going on for a while and has gradually worsened as she has gotten older.     Patient Stated Goals  to get the heart better.     Currently in Pain?  No/denies    Pain Score  0-No pain         [facility] PT Assessment - 12/13/18 1107      Assessment   Medical Diagnosis  severe mitral regurgitation    Referring Provider (PT)  Talbot Grumbling MD    Onset Date/Surgical Date  --   for a long time   Hand Dominance  Right    Prior Therapy  no      Precautions   Precautions  None    Precaution Comments  per the pamplet no jogging or heavy lifting      Restrictions   Weight Bearing Restrictions  No      Home Environment   Living Environment  Private residence    Living Arrangements  Alone    Type of [place] to enter;Ramped entrance    Entrance Stairs-Number of Steps  2    Home Layout  One level    Home Equipment  None  Prior Function   Level of Independence  Independent with basic ADLs    Vocation  Retired      ROM / Strength   AROM / PROM / Strength  AROM;Strength      AROM   Overall AROM   Within functional limits for tasks performed      Strength   Overall Strength Comments  WFL in all jointes with mild weakness in bil hip flexors at 3+/5    Right Hand Grip (lbs)  50    Left Hand Grip (lbs)  50      Ambulation/Gait   Ambulation/Gait  Yes    Gait Pattern  Step-through pattern;Trendelenburg;Narrow base of support    Gait Comments  pt ambulated 4:58 before having to rest 33 seconds and ambulated and addition 40 ft        [facility] Pre-Surgical Assessment - 12/13/18 1107    5 Meter Walk Test- trial 1  6 sec    5 Meter Walk Test- trial 2  5 sec.     5 Meter Walk Test- trial 3  5 sec.    5 meter walk test average  5.33 sec    4 Stage Balance Test tolerated for:   10 sec.    4 Stage Balance  Test Position  4    Sit To Stand Test- trial 1  17 sec.    ADL/IADL Independent with:  Bathing;Dressing;Meal prep;Finances    ADL/IADL Needs Assistance with:  [name] work    ADL/IADL Fraility Index  Midly frail    6 Minute Walk- Baseline  yes    BP (mmHg)  99/70    HR (bpm)  69    02 Sat (%RA)  97 %    Modified Borg Scale for Dyspnea  0- Nothing at all    Perceived Rate of Exertion (Borg)  8-    6 Minute Walk Post Test  yes    BP (mmHg)  145/83    HR (bpm)  102    02 Sat (%RA)  89 %    Modified Borg Scale for Dyspnea  0.5- Very, very slight shortness of breath    Perceived Rate of Exertion (Borg)  11- Fairly light    Aerobic Endurance Distance Walked  965    Endurance additional comments  pt demonstrates 45.33% limitation compared to age related norm              Objective measurements completed on examination: See above findings.                           Plan - 12/13/18 1136    Clinical Impression Statement  see assessment in note    Clinical Presentation  Stable    Clinical Decision Making  Low    Rehab Potential  Good    PT Frequency  One time visit    PT Next Visit Plan  one time evaluation for mitraclip      Clinical Impression Statement: Pt is a 68 yo M presenting to OP PT for evaluation prior to possible Mitraclip surgery due to severe mitral regurgitation. Pt reports onset of many years ago with recent exacerbation approximately 2-3  months ago. Symptoms are limiting prolonged walking/ standing and going up/ down steps. Pt presents with functional ROM and strength except for some limitation in bil hip flexors. excellent balance and is low at high fall risk 4 stage balance test, good  walking speed and  limited aerobic endurance per 6 minute walk test. pt ambulated 4:58 before having to rest 33 seconds and ambulated. At time of rest, patient's HR was 102 bpm and O2 was 89% on room air. Pt reported .5/10 shortness of breath on modified scale for  dyspnea. Pt able to resume after rest and ambulate an additional 40 feet. Pt ambulated a total of 965 feet in 6 minute walk. Fatigue increased significantly with 6 minute walk test. Based on the Short Physical Performance Battery, patient has a frailty rating of 9/12 with </= 5/12 considered frail.  Patient demonstrated  the following deficits and impairments:     Visit Diagnosis: Muscle weakness (generalized)     Problem List Patient Active Problem List   Diagnosis Date Noted  . Pulmonary HTN ([place])   . PVC (premature ventricular contraction) 02/20/2014  . Bruit 02/20/2014  . Mitral regurgitation 02/20/2014  . Postmenopausal bleeding 12/02/2012  . Endometrial polyp 12/02/2012  . Nonischemic dilated cardiomyopathy ([place]) 10/21/2012  . OBESITY 01/20/2008  . Essential hypertension 01/20/2008  . Chronic systolic CHF (congestive heart failure) ([place]) 01/20/2008   Starr Lake PT, DPT, LAT, ATC  12/13/18  11:42 AM      [facility] [address] [place], [place], [address] Phone: [phone]   Fax:  [phone]  Name: BEZA STEPPE MRN: [id number] Date of Birth: May 24, 1951

## 2018-12-13 NOTE — Progress Notes (Signed)
PCP - [facility] [place] Cardiologist - Dr. [name]  Chest x-ray - 12/13/18 EKG - 12/13/18 Stress Test - 2013 ECHO - 10/11/18 Cardiac Cath - 02/15/15  Sleep Study - denies  Aspirin Instructions: Per instructions, pt can continue all meds until the DOS. Pt will hold ASA on DOS.   Anesthesia review: Yes, heart history.   Patient denies shortness of breath, fever, cough and chest pain at PAT appointment   Patient verbalized understanding of instructions that were given to them at the PAT appointment. Patient was also instructed that they will need to review over the PAT instructions again at home before surgery.

## 2018-12-13 NOTE — Therapy (Deleted)
[place], [place], [address] Phone: [phone]   Fax:  [phone]  Physical Therapy Treatment  Patient Details  Name: Vanessa [name] MRN: [id number] Date of Birth: 07/16/1951 Referring Provider (PT): Talbot Grumbling MD   Encounter Date: 12/13/2018  PT End of Session - 12/13/18 1136    Visit Number  1    Number of Visits  1    Date for PT Re-Evaluation  12/13/18    PT Start Time  6144    PT Stop Time  1136    PT Time Calculation (min)  38 min    Activity Tolerance  Patient tolerated treatment well    Behavior During Therapy  [facility] for tasks assessed/performed       Past Medical History:  Diagnosis Date  . Dysrhythmia    PVC's  . Hypertension   . Moderate mitral regurgitation    moderate to severe MR with moderate pulmonary HTN  . Nonischemic cardiomyopathy ([place])    EF 30-35% by echo 2015  . Pulmonary HTN ([place])    moderate with PASP 48mmHg by echo 2015  . PVC's (premature ventricular contractions)     Past Surgical History:  Procedure Laterality Date  . CARDIAC CATHETERIZATION  2003   normal coronary arteries  . CARDIAC CATHETERIZATION  2013  Nov   h/o nonischemic DCM EF 35-40% increase LVEDP   . HYSTEROSCOPY W/D&C  12/02/2012   Procedure: DILATATION AND CURETTAGE /HYSTEROSCOPY;  Surgeon: Vanessa Sarah. [name] Mellow, MD;  Location: [facility];  Service: Gynecology;  Laterality: N/A;  cervical polypectomy  . LEFT AND RIGHT HEART CATHETERIZATION WITH CORONARY ANGIOGRAM N/A 02/15/2015   Procedure: LEFT AND RIGHT HEART CATHETERIZATION WITH CORONARY ANGIOGRAM;  Surgeon: Vanessa Booze, MD;  Location: [facility] CATH LAB;  Service: Cardiovascular;  Laterality: N/A;  . TEE WITHOUT CARDIOVERSION N/A 10/11/2018   Procedure: TRANSESOPHAGEAL ECHOCARDIOGRAM (TEE);  Surgeon: Vanessa Dresser, MD;  Location: [facility] ENDOSCOPY;  Service: Cardiovascular;  Laterality: N/A;    There were no vitals filed for this visit.  Subjective  Assessment - 12/13/18 1103    Subjective  pt is a 68 y.o with her CC of SOB with prolonged walking/ standing and going up/ down steps. she reports this has been going on for a while and has gradually worsened as she has gotten older.     Patient Stated Goals  to get the heart better.     Currently in Pain?  No/denies    Pain Score  0-No pain         [facility] PT Assessment - 12/13/18 1107      Assessment   Medical Diagnosis  severe mitral regurgitation    Referring Provider (PT)  Talbot Grumbling MD    Onset Date/Surgical Date  --   for a long time   Hand Dominance  Right    Prior Therapy  no      Precautions   Precautions  None    Precaution Comments  per the pamplet no jogging or heavy lifting      Restrictions   Weight Bearing Restrictions  No      Home Environment   Living Environment  Private residence    Living Arrangements  Alone    Type of [place] to enter;Ramped entrance    Entrance Stairs-Number of Steps  2    Home Layout  One level    Home Equipment  None  Prior Function   Level of Independence  Independent with basic ADLs    Vocation  Retired      ROM / Strength   AROM / PROM / Strength  AROM;Strength      AROM   Overall AROM   Within functional limits for tasks performed      Strength   Overall Strength Comments  WFL in all jointes with mild weakness in bil hip flexors at 3+/5    Right Hand Grip (lbs)  50    Left Hand Grip (lbs)  50      Ambulation/Gait   Ambulation/Gait  Yes    Gait Pattern  Step-through pattern;Trendelenburg;Narrow base of support    Gait Comments  pt ambulated 4:58 before having to rest 33 seconds and ambulated and addition 40 ft        [facility] Pre-Surgical Assessment - 12/13/18 1107    4 Stage Balance Test tolerated for:   10 sec.    4 Stage Balance Test Position  4    Sit To Stand Test- trial 1  17 sec.    ADL/IADL Independent with:  Bathing;Dressing;Meal prep;Finances    ADL/IADL Needs Assistance  with:  [name] work    ADL/IADL Fraility Index  Midly frail    6 Minute Walk- Baseline  yes    BP (mmHg)  99/70    HR (bpm)  69    02 Sat (%RA)  97 %    Modified Borg Scale for Dyspnea  0- Nothing at all    Perceived Rate of Exertion (Borg)  8-    6 Minute Walk Post Test  yes    BP (mmHg)  145/83    HR (bpm)  102    02 Sat (%RA)  89 %    Modified Borg Scale for Dyspnea  0.5- Very, very slight shortness of breath    Perceived Rate of Exertion (Borg)  11- Fairly light    Aerobic Endurance Distance Walked  965    Endurance additional comments  pt demonstrates 45.33% limitation compared to age related norm                                     Plan - 12/13/18 1136    Clinical Impression Statement  see assessment in note    Clinical Presentation  Stable    Clinical Decision Making  Low    Rehab Potential  Good    PT Frequency  One time visit    PT Next Visit Plan  one time evaluation for mitraclip       Clinical Impression Statement: Pt is a 68 yo M presenting to OP PT for evaluation prior to possible Mitraclip surgery due to severe mitral regurgitation. Pt reports onset of many years ago with recent exacerbation approximately 2-3  months ago. Symptoms are limiting prolonged walking/ standing and going up/ down steps. Pt presents with functional ROM and strength except for some limitation in bil hip flexors. excellent balance and is low at high fall risk 4 stage balance test, good  walking speed and limited aerobic endurance per 6 minute walk test. pt ambulated 4:58 before having to rest 33 seconds and ambulated. At time of rest, patient's HR was 102 bpm and O2 was 89% on room air. Pt reported .5/10 shortness of breath on modified scale for dyspnea. Pt able to resume after rest and ambulate an additional 40 feet.  Pt ambulated a total of 965 feet in 6 minute walk. Fatigue increased significantly with 6 minute walk test. Based on the Short Physical Performance Battery,  patient has a frailty rating of 9/12 with </= 5/12 considered frail.      Patient demonstrated  the following deficits and impairments:     Visit Diagnosis: Muscle weakness (generalized)     Problem List Patient Active Problem List   Diagnosis Date Noted  . Pulmonary HTN ([place])   . PVC (premature ventricular contraction) 02/20/2014  . Bruit 02/20/2014  . Mitral regurgitation 02/20/2014  . Postmenopausal bleeding 12/02/2012  . Endometrial polyp 12/02/2012  . Nonischemic dilated cardiomyopathy ([place]) 10/21/2012  . OBESITY 01/20/2008  . Essential hypertension 01/20/2008  . Chronic systolic CHF (congestive heart failure) ([place]) 01/20/2008   Starr Lake PT, DPT, LAT, ATC  12/13/18  11:41 AM      [facility] [address] [place], [place], [address] Phone: [phone]   Fax:  [phone]  Name: KEMAYA DORNER MRN: [id number] Date of Birth: Feb 25, 1951

## 2018-12-14 ENCOUNTER — Inpatient Hospital Stay (HOSPITAL_COMMUNITY): Payer: PPO | Admitting: Critical Care Medicine

## 2018-12-14 ENCOUNTER — Encounter (HOSPITAL_COMMUNITY): Payer: Self-pay | Admitting: Critical Care Medicine

## 2018-12-14 ENCOUNTER — Other Ambulatory Visit: Payer: Self-pay

## 2018-12-14 ENCOUNTER — Inpatient Hospital Stay (HOSPITAL_COMMUNITY)
Admission: RE | Admit: 2018-12-14 | Discharge: 2018-12-15 | DRG: 267 | Disposition: A | Discharge status: Home / Self Care | Payer: PPO | Attending: Cardiovascular Disease | Admitting: Cardiovascular Disease

## 2018-12-14 ENCOUNTER — Inpatient Hospital Stay (HOSPITAL_COMMUNITY): Payer: PPO

## 2018-12-14 ENCOUNTER — Encounter (HOSPITAL_COMMUNITY)
Admission: RE | Disposition: A | Discharge status: Home / Self Care | Payer: Self-pay | Source: Home / Self Care | Attending: Cardiovascular Disease

## 2018-12-14 DIAGNOSIS — E875 Hyperkalemia: Secondary | ICD-10-CM | POA: Diagnosis present

## 2018-12-14 DIAGNOSIS — Z7982 Long term (current) use of aspirin: Secondary | ICD-10-CM | POA: Diagnosis not present

## 2018-12-14 DIAGNOSIS — I340 Nonrheumatic mitral (valve) insufficiency: Secondary | ICD-10-CM | POA: Diagnosis present

## 2018-12-14 DIAGNOSIS — I110 Hypertensive heart disease with heart failure: Secondary | ICD-10-CM | POA: Diagnosis not present

## 2018-12-14 DIAGNOSIS — Z79899 Other long term (current) drug therapy: Secondary | ICD-10-CM | POA: Diagnosis not present

## 2018-12-14 DIAGNOSIS — N183 Chronic kidney disease, stage 3 (moderate): Secondary | ICD-10-CM | POA: Diagnosis present

## 2018-12-14 DIAGNOSIS — I272 Pulmonary hypertension, unspecified: Secondary | ICD-10-CM | POA: Diagnosis present

## 2018-12-14 DIAGNOSIS — I130 Hypertensive heart and chronic kidney disease with heart failure and stage 1 through stage 4 chronic kidney disease, or unspecified chronic kidney disease: Secondary | ICD-10-CM | POA: Diagnosis present

## 2018-12-14 DIAGNOSIS — I428 Other cardiomyopathies: Secondary | ICD-10-CM | POA: Diagnosis present

## 2018-12-14 DIAGNOSIS — I5022 Chronic systolic (congestive) heart failure: Secondary | ICD-10-CM | POA: Diagnosis present

## 2018-12-14 DIAGNOSIS — Z006 Encounter for examination for normal comparison and control in clinical research program: Secondary | ICD-10-CM | POA: Diagnosis not present

## 2018-12-14 DIAGNOSIS — E041 Nontoxic single thyroid nodule: Secondary | ICD-10-CM | POA: Diagnosis present

## 2018-12-14 DIAGNOSIS — I509 Heart failure, unspecified: Secondary | ICD-10-CM | POA: Diagnosis not present

## 2018-12-14 HISTORY — PX: TRANSCATHETER MITRAL EDGE TO EDGE REPAIR: CATH118311

## 2018-12-14 LAB — CBC
HCT: 34.3 % — ABNORMAL LOW (ref 36.0–46.0)
Hemoglobin: 10.7 g/dL — ABNORMAL LOW (ref 12.0–15.0)
MCH: 28.2 pg (ref 26.0–34.0)
MCHC: 31.2 g/dL (ref 30.0–36.0)
MCV: 90.5 fL (ref 80.0–100.0)
PLATELETS: 206 10*3/uL (ref 150–400)
RBC: 3.79 MIL/uL — AB (ref 3.87–5.11)
RDW: 14.5 % (ref 11.5–15.5)
WBC: 7 10*3/uL (ref 4.0–10.5)
nRBC: 0 % (ref 0.0–0.2)

## 2018-12-14 LAB — POCT I-STAT, CHEM 8
BUN: 36 mg/dL — AB (ref 8–23)
Calcium, Ion: 1.21 mmol/L (ref 1.15–1.40)
Chloride: 111 mmol/L (ref 98–111)
Creatinine, Ser: 1.3 mg/dL — ABNORMAL HIGH (ref 0.44–1.00)
Glucose, Bld: 130 mg/dL — ABNORMAL HIGH (ref 70–99)
HCT: 30 % — ABNORMAL LOW (ref 36.0–46.0)
Hemoglobin: 10.2 g/dL — ABNORMAL LOW (ref 12.0–15.0)
Potassium: 5 mmol/L (ref 3.5–5.1)
Sodium: 141 mmol/L (ref 135–145)
TCO2: 22 mmol/L (ref 22–32)

## 2018-12-14 LAB — POCT ACTIVATED CLOTTING TIME
Activated Clotting Time: 224 seconds
Activated Clotting Time: 263 seconds
Activated Clotting Time: 241 seconds

## 2018-12-14 LAB — BLOOD GAS, ARTERIAL
Acid-base deficit: 2.9 mmol/L — ABNORMAL HIGH (ref 0.0–2.0)
Bicarbonate: 21.6 mmol/L (ref 20.0–28.0)
DRAWN BY: 42180
O2 Content: 2 L/min
O2 Saturation: 98.9 %
Patient temperature: 98.6
pCO2 arterial: 38.7 mmHg (ref 32.0–48.0)
pH, Arterial: 7.366 (ref 7.350–7.450)
pO2, Arterial: 154 mmHg — ABNORMAL HIGH (ref 83.0–108.0)

## 2018-12-14 LAB — CREATININE, SERUM
CREATININE: 1.63 mg/dL — AB (ref 0.44–1.00)
GFR calc Af Amer: 37 mL/min — ABNORMAL LOW (ref >60)
GFR calc non Af Amer: 32 mL/min — ABNORMAL LOW (ref >60)

## 2018-12-14 SURGERY — MITRAL VALVE REPAIR
Anesthesia: General

## 2018-12-14 MED ORDER — DEXAMETHASONE SODIUM PHOSPHATE 10 MG/ML IJ SOLN
INTRAMUSCULAR | Status: DC | PRN
  Administered 2018-12-14: 4 mg via INTRAVENOUS

## 2018-12-14 MED ORDER — LIDOCAINE 2% (20 MG/ML) 5 ML SYRINGE
INTRAMUSCULAR | Status: DC | PRN
  Administered 2018-12-14: 60 mg via INTRAVENOUS

## 2018-12-14 MED ORDER — ONDANSETRON HCL 4 MG/2ML IJ SOLN: 4.0000 mg | Freq: Four times a day (QID) | INTRAMUSCULAR | Status: DC | PRN

## 2018-12-14 MED ORDER — CARVEDILOL 25 MG PO TABS
25.0000 mg | ORAL_TABLET | Freq: Two times a day (BID) | ORAL | Status: DC
  Administered 2018-12-14 – 2018-12-15 (×2): 25 mg via ORAL
  Filled 2018-12-14 (×2): qty 1

## 2018-12-14 MED ORDER — CLOPIDOGREL BISULFATE 75 MG PO TABS
75.0000 mg | ORAL_TABLET | Freq: Every day | ORAL | Status: DC
  Administered 2018-12-15: 75 mg via ORAL
  Filled 2018-12-14: qty 1

## 2018-12-14 MED ORDER — PROTAMINE SULFATE 10 MG/ML IV SOLN
INTRAVENOUS | Status: DC | PRN
  Administered 2018-12-14: 50 mg via INTRAVENOUS

## 2018-12-14 MED ORDER — ROCURONIUM BROMIDE 10 MG/ML (PF) SYRINGE
PREFILLED_SYRINGE | INTRAVENOUS | Status: DC | PRN
  Administered 2018-12-14: 10 mg via INTRAVENOUS
  Administered 2018-12-14: 20 mg via INTRAVENOUS
  Administered 2018-12-14: 50 mg via INTRAVENOUS
  Administered 2018-12-14: 20 mg via INTRAVENOUS

## 2018-12-14 MED ORDER — SODIUM CHLORIDE 0.9% FLUSH: 3.0000 mL | INTRAVENOUS | Status: DC | PRN

## 2018-12-14 MED ORDER — CHLORHEXIDINE GLUCONATE 4 % EX LIQD: 60.0000 mL | Freq: Once | CUTANEOUS | Status: DC

## 2018-12-14 MED ORDER — SACUBITRIL-VALSARTAN 49-51 MG PO TABS
1.0000 | ORAL_TABLET | Freq: Two times a day (BID) | ORAL | Status: DC
  Administered 2018-12-15: 1 via ORAL
  Filled 2018-12-14 (×3): qty 1

## 2018-12-14 MED ORDER — SODIUM CHLORIDE 0.9 % IV SOLN: 250.0000 mL | INTRAVENOUS | Status: DC | PRN

## 2018-12-14 MED ORDER — CHLORHEXIDINE GLUCONATE 4 % EX LIQD
60.0000 mL | Freq: Once | CUTANEOUS | Status: DC
  Filled 2018-12-14: qty 60

## 2018-12-14 MED ORDER — FUROSEMIDE 40 MG PO TABS
40.0000 mg | ORAL_TABLET | Freq: Every day | ORAL | Status: DC
  Administered 2018-12-14 – 2018-12-15 (×2): 40 mg via ORAL
  Filled 2018-12-14 (×2): qty 1

## 2018-12-14 MED ORDER — SUGAMMADEX SODIUM 200 MG/2ML IV SOLN
INTRAVENOUS | Status: DC | PRN
  Administered 2018-12-14: 200 mg via INTRAVENOUS

## 2018-12-14 MED ORDER — PROPOFOL 10 MG/ML IV BOLUS
INTRAVENOUS | Status: DC | PRN
  Administered 2018-12-14: 20 mg via INTRAVENOUS
  Administered 2018-12-14: 50 mg via INTRAVENOUS

## 2018-12-14 MED ORDER — ALBUMIN HUMAN 5 % IV SOLN
INTRAVENOUS | Status: DC | PRN
  Administered 2018-12-14: 13:00:00 via INTRAVENOUS

## 2018-12-14 MED ORDER — MIDAZOLAM HCL 5 MG/5ML IJ SOLN
INTRAMUSCULAR | Status: DC | PRN
  Administered 2018-12-14 (×2): 1 mg via INTRAVENOUS

## 2018-12-14 MED ORDER — PHENYLEPHRINE 40 MCG/ML (10ML) SYRINGE FOR IV PUSH (FOR BLOOD PRESSURE SUPPORT)
PREFILLED_SYRINGE | INTRAVENOUS | Status: DC | PRN
  Administered 2018-12-14: 40 ug via INTRAVENOUS
  Administered 2018-12-14 (×3): 80 ug via INTRAVENOUS
  Administered 2018-12-14 (×3): 40 ug via INTRAVENOUS

## 2018-12-14 MED ORDER — LACTATED RINGERS IV SOLN
INTRAVENOUS | Status: DC | PRN
  Administered 2018-12-14: 11:00:00 via INTRAVENOUS

## 2018-12-14 MED ORDER — SODIUM CHLORIDE 0.9 % IV SOLN
1.5000 g | INTRAVENOUS | Status: AC
  Administered 2018-12-14: 1.5 g via INTRAVENOUS
  Filled 2018-12-14: qty 1.5

## 2018-12-14 MED ORDER — HEPARIN (PORCINE) IN NACL 2000-0.9 UNIT/L-% IV SOLN
INTRAVENOUS | Status: DC | PRN
  Administered 2018-12-14 (×3): 1000 mL

## 2018-12-14 MED ORDER — HEPARIN (PORCINE) IN NACL 1000-0.9 UT/500ML-% IV SOLN
INTRAVENOUS | Status: DC | PRN
  Administered 2018-12-14: 500 mL

## 2018-12-14 MED ORDER — SPIRONOLACTONE 12.5 MG HALF TABLET: 12.5000 mg | ORAL_TABLET | Freq: Every day | ORAL | Status: DC

## 2018-12-14 MED ORDER — ASPIRIN EC 81 MG PO TBEC
81.0000 mg | DELAYED_RELEASE_TABLET | Freq: Every day | ORAL | Status: DC
  Administered 2018-12-15: 81 mg via ORAL
  Filled 2018-12-14: qty 1

## 2018-12-14 MED ORDER — HEPARIN SODIUM (PORCINE) 5000 UNIT/ML IJ SOLN
5000.0000 [IU] | Freq: Three times a day (TID) | INTRAMUSCULAR | Status: DC
  Administered 2018-12-15: 5000 [IU] via SUBCUTANEOUS
  Filled 2018-12-14: qty 1

## 2018-12-14 MED ORDER — HEPARIN SODIUM (PORCINE) 1000 UNIT/ML IJ SOLN
INTRAMUSCULAR | Status: DC | PRN
  Administered 2018-12-14: 8000 [IU] via INTRAVENOUS
  Administered 2018-12-14: 2000 [IU] via INTRAVENOUS

## 2018-12-14 MED ORDER — CHLORHEXIDINE GLUCONATE 0.12 % MT SOLN
15.0000 mL | Freq: Once | OROMUCOSAL | Status: AC
  Administered 2018-12-14: 15 mL via OROMUCOSAL
  Filled 2018-12-14 (×2): qty 15

## 2018-12-14 MED ORDER — ONDANSETRON HCL 4 MG/2ML IJ SOLN
INTRAMUSCULAR | Status: DC | PRN
  Administered 2018-12-14: 4 mg via INTRAVENOUS

## 2018-12-14 MED ORDER — SODIUM CHLORIDE 0.9 % IV SOLN: INTRAVENOUS | Status: DC

## 2018-12-14 MED ORDER — VANCOMYCIN HCL 10 G IV SOLR
1500.0000 mg | INTRAVENOUS | Status: DC
  Filled 2018-12-14: qty 1500

## 2018-12-14 MED ORDER — SODIUM CHLORIDE 0.9 % IV SOLN
INTRAVENOUS | Status: DC | PRN
  Administered 2018-12-14: 20 ug/min via INTRAVENOUS
  Administered 2018-12-14: 110 ug/min via INTRAVENOUS

## 2018-12-14 MED ORDER — SODIUM CHLORIDE 0.9% FLUSH
3.0000 mL | Freq: Two times a day (BID) | INTRAVENOUS | Status: DC
  Administered 2018-12-14 – 2018-12-15 (×2): 3 mL via INTRAVENOUS

## 2018-12-14 MED ORDER — ACETAMINOPHEN 325 MG PO TABS
650.0000 mg | ORAL_TABLET | ORAL | Status: DC | PRN
  Filled 2018-12-14: qty 2

## 2018-12-14 MED ORDER — FENTANYL CITRATE (PF) 250 MCG/5ML IJ SOLN
INTRAMUSCULAR | Status: DC | PRN
  Administered 2018-12-14 (×2): 50 ug via INTRAVENOUS

## 2018-12-14 MED ORDER — CHLORHEXIDINE GLUCONATE 4 % EX LIQD: 30.0000 mL | CUTANEOUS | Status: DC

## 2018-12-14 SURGICAL SUPPLY — 20 items
CATHETER STEERABLE GUIDE (CATHETERS) ×2 IMPLANT
CLIP MITRACLIP NTR (Prosthesis & Implant Heart) ×4 IMPLANT
DEVICE CLOSURE PERCLS PRGLD 6F (VASCULAR PRODUCTS) IMPLANT
GUIDEWIRE SAFE TJ AMPLATZ EXST (WIRE) ×2 IMPLANT
KIT DILATOR VASC 18G NDL (KITS) ×2 IMPLANT
KIT HEART LEFT (KITS) ×5 IMPLANT
MITRACLIP 1 SGC 2 NTR MBR0102 (KITS) ×2 IMPLANT
NDL BAYLIS TRANSSEPTAL 71CM (NEEDLE) IMPLANT
NEEDLE BAYLIS TRANSSEPTAL 71CM (NEEDLE) ×3 IMPLANT
PACK CARDIAC CATHETERIZATION (CUSTOM PROCEDURE TRAY) ×3 IMPLANT
PERCLOSE PROGLIDE 6F (VASCULAR PRODUCTS) ×6
SHEATH AVANTI 11CM 8FR (SHEATH) ×3 IMPLANT
SHEATH PINNACLE 8F 10CM (SHEATH) ×2 IMPLANT
SHEATH PROBE COVER 6X72 (BAG) ×5 IMPLANT
SHEATH SWARTZ SL1 8.5FR 63 (SHEATH) ×2 IMPLANT
STOPCOCK MORSE 400PSI 3WAY (MISCELLANEOUS) ×17 IMPLANT
TRANSDUCER W/STOPCOCK (MISCELLANEOUS) ×3 IMPLANT
TUBING ART PRESS 72  MALE/FEM (TUBING) ×4
TUBING ART PRESS 72 MALE/FEM (TUBING) ×1 IMPLANT
WIRE EMERALD 3MM-J .035X150CM (WIRE) ×2 IMPLANT

## 2018-12-14 NOTE — Interval H&P Note (Signed)
History and Physical Interval Note:  12/14/2018 12:10 PM  Vanessa [name]  has presented today for surgery, with the diagnosis of Severe Mitral Regurgitation  The various methods of treatment have been discussed with the patient and family. After consideration of risks, benefits and other options for treatment, the patient has consented to  Procedure(s): MITRAL VALVE REPAIR (N/A) as a surgical intervention .  The patient's history has been reviewed, patient examined, no change in status, stable for surgery.  I have reviewed the patient's chart and labs.  Questions were answered to the patient's satisfaction.    The patient has been previously evaluated for percutaneous mitral valve repair in the context of severe mitral insufficiency and progressive symptoms of congestive heart failure.  She reports no significant changes since she was evaluated November 28, 2018 by Dr. Algernon Huxley as detailed above.  The patient has New York Heart Association functional class III symptoms of chronic systolic heart failure.  We plan to proceed with percutaneous edge to edge mitral valve repair using MitraClip today.  I have reviewed risks, indications, and alternatives with the patient at length.  Sherren Mocha

## 2018-12-14 NOTE — Anesthesia Preprocedure Evaluation (Addendum)
Anesthesia Evaluation  Patient identified by MRN, date of birth, ID band Patient awake    Reviewed: Allergy & Precautions, H&P , NPO status , Patient's Chart, lab work & pertinent test results, reviewed documented beta blocker date and time   Airway Mallampati: III  TM Distance: >3 FB Neck ROM: Full    Dental no notable dental hx. (+) Teeth Intact, Dental Advisory Given   Pulmonary neg pulmonary ROS,    Pulmonary exam normal breath sounds clear to auscultation       Cardiovascular hypertension, Pt. on medications and Pt. on home beta blockers +CHF  + Valvular Problems/Murmurs MR  Rhythm:Regular Rate:Normal + Systolic murmurs    Neuro/Psych negative neurological ROS  negative psych ROS   GI/Hepatic negative GI ROS, Neg liver ROS,   Endo/Other  negative endocrine ROS  Renal/GU Renal InsufficiencyRenal disease  negative genitourinary   Musculoskeletal   Abdominal   Peds  Hematology negative hematology ROS (+)   Anesthesia Other Findings   Reproductive/Obstetrics negative OB ROS                           Anesthesia Physical Anesthesia Plan  ASA: IV  Anesthesia Plan: General   Post-op Pain Management:    Induction: Intravenous  PONV Risk Score and Plan: 4 or greater and Ondansetron, Dexamethasone and Treatment may vary due to age or medical condition  Airway Management Planned: Oral ETT  Additional Equipment: Arterial line  Intra-op Plan:   Post-operative Plan: Extubation in OR and Possible Post-op intubation/ventilation  Informed Consent: I have reviewed the patients History and Physical, chart, labs and discussed the procedure including the risks, benefits and alternatives for the proposed anesthesia with the patient or authorized representative who has indicated his/her understanding and acceptance.     Dental advisory given  Plan Discussed with: CRNA  Anesthesia Plan  Comments:         Anesthesia Quick Evaluation

## 2018-12-14 NOTE — Anesthesia Procedure Notes (Addendum)
Procedure Name: Intubation Date/Time: 12/14/2018 12:10 PM Performed by: Wilburn Cornelia, CRNA Pre-anesthesia Checklist: Patient identified, Emergency Drugs available, Suction available, Patient being monitored and Timeout performed Patient Re-evaluated:Patient Re-evaluated prior to induction Oxygen Delivery Method: Circle system utilized Preoxygenation: Pre-oxygenation with 100% oxygen Induction Type: IV induction Ventilation: Mask ventilation without difficulty and Oral airway inserted - appropriate to patient size Grade View: Grade II Tube type: Oral Tube size: 7.0 mm Number of attempts: 3 Airway Equipment and Method: Video-laryngoscopy and Stylet Placement Confirmation: ETT inserted through vocal cords under direct vision,  positive ETCO2,  CO2 detector and breath sounds checked- equal and bilateral Secured at: 21 cm Tube secured with: Tape Dental Injury: Teeth and Oropharynx as per pre-operative assessment  Difficulty Due To: Difficulty was unanticipated and Difficult Airway- due to anterior larynx Future Recommendations: Recommend- induction with short-acting agent, and alternative techniques readily available Comments: DLx1 by H. Truman Hayward CRNA with MAC 3 - grade 4 view.  DLX1 by Dr. Therisa Doyne with Sabra Heck 2 - grade 4 view.  DLx1 with glidescope by Dr. Therisa Doyne - grade 2 view.  7.0 ETT inserted atraumatically. Confirmation of placement.

## 2018-12-14 NOTE — Transfer of Care (Signed)
Immediate Anesthesia Transfer of Care Note  [name]  Procedure(s) Performed: MITRAL VALVE REPAIR (N/A )  Patient Location: Cath Lab  Anesthesia Type:General  Level of Consciousness: awake, alert  and oriented  Airway & Oxygen Therapy: Patient Spontanous Breathing and Patient connected to nasal cannula oxygen  Post-op Assessment: Report given to RN and Post -op Vital signs reviewed and stable  Post vital signs: Reviewed and stable  Last Vitals:  Vitals Value Taken Time  BP 102/55 12/14/2018  2:56 PM  Temp    Pulse 82 12/14/2018  3:00 PM  Resp 15 12/14/2018  3:00 PM  SpO2 94 % 12/14/2018  3:00 PM  Vitals shown include unvalidated device data.  Last Pain:  Vitals:   12/14/18 1108  TempSrc:   PainSc: 0-No pain      Patients Stated Pain Goal: 2 (72/09/47 0962)  Complications: No apparent anesthesia complications

## 2018-12-14 NOTE — Progress Notes (Signed)
Site area: RBA Site Prior to Removal:  Level 0 Pressure Applied For:10 min Manual:   yes Patient Status During Pull:  stable Post Pull Site:  Level 0 Post Pull Instructions Given:  yes Post Pull Pulses Present: palpable Dressing Applied:  clear Bedrest begins @ for groin---- 1430 till 1630 Comments:

## 2018-12-14 NOTE — Anesthesia Procedure Notes (Signed)
Arterial Line Insertion Start/End1/15/2020 12:25 PM, 12/14/2018 12:40 PM Performed by: Roderic Palau, MD, anesthesiologist  Patient location: Pre-op. Preanesthetic checklist: patient identified, IV checked, site marked, risks and benefits discussed, surgical consent, monitors and equipment checked, pre-op evaluation, timeout performed and anesthesia consent Lidocaine 1% used for infiltration Right, brachial was placed Catheter size: 20 Fr Hand hygiene performed  and maximum sterile barriers used   Attempts: 2 (!st attempt L Radial. Unable to pass wire.) Procedure performed using ultrasound guided technique. Ultrasound Notes:anatomy identified, needle tip was noted to be adjacent to the nerve/plexus identified, no ultrasound evidence of intravascular and/or intraneural injection and image(s) printed for medical record Following insertion, dressing applied, Biopatch and line sutured. Post procedure assessment: normal and unchanged  Patient tolerated the procedure well with no immediate complications.

## 2018-12-14 NOTE — Progress Notes (Signed)
  Echocardiogram Echocardiogram Transesophageal has been performed.  Darlina Sicilian M 12/14/2018, 3:00 PM

## 2018-12-14 NOTE — Anesthesia Postprocedure Evaluation (Signed)
Anesthesia Post Note  [name]  Procedure(s) Performed: MITRAL VALVE REPAIR (N/A )     Patient location during evaluation: PACU Anesthesia Type: General Level of consciousness: awake and alert Pain management: pain level controlled Vital Signs Assessment: post-procedure vital signs reviewed and stable Respiratory status: spontaneous breathing, nonlabored ventilation, respiratory function stable and patient connected to nasal cannula oxygen Cardiovascular status: blood pressure returned to baseline and stable Postop Assessment: no apparent nausea or vomiting Anesthetic complications: no    Last Vitals:  Vitals:   12/14/18 1519 12/14/18 1520  BP:  120/65  Pulse:  81  Resp:  (!) 0  Temp: (!) 36.4 C   SpO2:  95%    Last Pain:  Vitals:   12/14/18 1519  TempSrc: Temporal  PainSc:                  Dariona Postma,W. EDMOND

## 2018-12-15 ENCOUNTER — Encounter (HOSPITAL_COMMUNITY): Payer: Self-pay | Admitting: Cardiovascular Disease

## 2018-12-15 ENCOUNTER — Inpatient Hospital Stay (HOSPITAL_COMMUNITY): Payer: PPO

## 2018-12-15 ENCOUNTER — Other Ambulatory Visit (HOSPITAL_COMMUNITY): Payer: PPO

## 2018-12-15 DIAGNOSIS — I340 Nonrheumatic mitral (valve) insufficiency: Principal | ICD-10-CM

## 2018-12-15 LAB — CBC
HCT: 32 % — ABNORMAL LOW (ref 36.0–46.0)
Hemoglobin: 10.1 g/dL — ABNORMAL LOW (ref 12.0–15.0)
MCH: 28.5 pg (ref 26.0–34.0)
MCHC: 31.6 g/dL (ref 30.0–36.0)
MCV: 90.1 fL (ref 80.0–100.0)
Platelets: 211 10*3/uL (ref 150–400)
RBC: 3.55 MIL/uL — ABNORMAL LOW (ref 3.87–5.11)
RDW: 14.6 % (ref 11.5–15.5)
WBC: 7.3 10*3/uL (ref 4.0–10.5)
nRBC: 0 % (ref 0.0–0.2)

## 2018-12-15 LAB — BASIC METABOLIC PANEL
Anion gap: 8 (ref 5–15)
BUN: 46 mg/dL — ABNORMAL HIGH (ref 8–23)
CO2: 22 mmol/L (ref 22–32)
Calcium: 8.4 mg/dL — ABNORMAL LOW (ref 8.9–10.3)
Chloride: 109 mmol/L (ref 98–111)
Creatinine, Ser: 1.72 mg/dL — ABNORMAL HIGH (ref 0.44–1.00)
GFR calc Af Amer: 35 mL/min — ABNORMAL LOW (ref >60)
GFR calc non Af Amer: 30 mL/min — ABNORMAL LOW (ref >60)
Glucose, Bld: 143 mg/dL — ABNORMAL HIGH (ref 70–99)
Potassium: 5.3 mmol/L — ABNORMAL HIGH (ref 3.5–5.1)
Sodium: 139 mmol/L (ref 135–145)

## 2018-12-15 LAB — ECHOCARDIOGRAM LIMITED
Height: 62 in
Weight: 3214.41 oz

## 2018-12-15 MED ORDER — CLOPIDOGREL BISULFATE 75 MG PO TABS: 75.0000 mg | ORAL_TABLET | Freq: Every day | ORAL | 2 refills | Status: DC

## 2018-12-15 MED ORDER — FUROSEMIDE 40 MG PO TABS: 40.0000 mg | ORAL_TABLET | Freq: Every day | ORAL | 5 refills | Status: DC

## 2018-12-15 NOTE — Progress Notes (Addendum)
Progress Note  Patient Name: Vanessa [name] Date of Encounter: 12/15/2018  Primary Cardiologist: No primary care provider on file.   Subjective   The patient feels well this morning.  No chest pain or shortness of breath.  No complaints.  Inpatient Medications    Scheduled Meds: . aspirin EC  81 mg Oral Daily  . carvedilol  25 mg Oral BID WC  . clopidogrel  75 mg Oral Q breakfast  . furosemide  40 mg Oral Daily  . heparin injection (subcutaneous)  5,000 Units Subcutaneous Q8H  . sacubitril-valsartan  1 tablet Oral BID  . sodium chloride flush  3 mL Intravenous Q12H  . spironolactone  12.5 mg Oral Daily   Continuous Infusions: . sodium chloride     PRN Meds: sodium chloride, acetaminophen, ondansetron (ZOFRAN) IV, sodium chloride flush   Vital Signs    Vitals:   12/14/18 2047 12/15/18 0000 12/15/18 0410 12/15/18 0427  BP:   97/62   Pulse:      Resp:   20   Temp:   98.7 F (37.1 C)   TempSrc:   Oral   SpO2: 97% 96%  96%  Weight:      Height:        Intake/Output Summary (Last 24 hours) at 12/15/2018 0801 Last data filed at 12/15/2018 0411 Gross per 24 hour  Intake 1870 ml  Output 1940 ml  Net -70 ml   Last 3 Weights 12/14/2018 12/13/2018 11/28/2018  Weight (lbs) 200 lb 14.4 oz 200 lb 14.4 oz 207 lb 9.6 oz  Weight (kg) 91.128 kg 91.128 kg 94.167 kg      Telemetry    Normal sinus rhythm, sinus bradycardia- Personally Reviewed  ECG    Normal sinus rhythm, single PVC, ST and T wave abnormality consider lateral ischemia- Personally Reviewed  Physical Exam  Alert, oriented woman in no distress GEN: No acute distress.   Neck: No JVD Cardiac: RRR, 2/6 systolic murmur at the apex Respiratory: Clear to auscultation bilaterally. GI: Soft, nontender, non-distended  MS: No edema; No deformity.  Right groin site is clear Neuro:  Nonfocal  Psych: Normal affect   Labs    Chemistry Recent Labs  Lab 12/08/18 0909 12/13/18 0845 12/14/18 1501  12/14/18 2024 12/15/18 0233  NA 139 139 141  --  139  K 4.4 4.4 5.0  --  5.3*  CL 105 108 111  --  109  CO2 26 24  --   --  22  GLUCOSE 154* 79 130*  --  143*  BUN 43* 42* 36*  --  46*  CREATININE 1.80* 1.65* 1.30* 1.63* 1.72*  CALCIUM 9.5 9.2  --   --  8.4*  PROT  --  7.3  --   --   --   ALBUMIN  --  3.6  --   --   --   AST  --  11*  --   --   --   ALT  --  7  --   --   --   ALKPHOS  --  33*  --   --   --   BILITOT  --  2.3*  --   --   --   GFRNONAA 29* 32*  --  32* 30*  GFRAA 33* 37*  --  37* 35*  ANIONGAP 8 7  --   --  8     Hematology Recent Labs  Lab 12/13/18 0845 12/14/18 1501 12/14/18 2024 12/15/18  0233  WBC 5.0  --  7.0 7.3  RBC 3.79*  --  3.79* 3.55*  HGB 10.6* 10.2* 10.7* 10.1*  HCT 35.4* 30.0* 34.3* 32.0*  MCV 93.4  --  90.5 90.1  MCH 28.0  --  28.2 28.5  MCHC 29.9*  --  31.2 31.6  RDW 14.7  --  14.5 14.6  PLT 226  --  206 211    Cardiac EnzymesNo results for input(s): TROPONINI in the last 168 hours. No results for input(s): TROPIPOC in the last 168 hours.   BNP Recent Labs  Lab 12/13/18 0845  BNP 618.4*     DDimer No results for input(s): DDIMER in the last 168 hours.   Radiology    Dg Chest 2 View  Result Date: 12/13/2018 CLINICAL DATA:  Pre cardiac catheterization, history of severe mitral regurgitation EXAM: CHEST - 2 VIEW COMPARISON:  CT angio chest of 06/04/2015 and chest x-ray of 01/28/2015 FINDINGS: No active infiltrate or effusion is seen. There is some deviation of the tracheal air shadow to the right of midline superiorly. This could be due to enlargement of the thyroid gland. Clinical correlation is recommended. Moderate cardiomegaly is stable. No acute bony abnormality is seen. IMPRESSION: 1. Stable cardiomegaly.  No active lung disease. 2. Question of enlarged thyroid deviating the tracheal air shadow to the right of midline. Electronically Signed   By: Ivar Drape M.D.   On: 12/13/2018 09:42    Cardiac Studies   Postoperative day  #1 echocardiogram is pending  Patient Profile     68 y.o. female with New York Heart Association functional class III symptoms of chronic systolic heart failure and severe mitral regurgitation who presented for percutaneous mitral valve repair with MitraClip, treated with 2 MitraClip and TR devices 12/14/2018  Assessment & Plan    1.  Severe symptomatic mitral regurgitation, stage D: Patient underwent MitraClip procedure yesterday with 2 clips placed.  She has done well with no immediate procedural complications.  She will have her postoperative day #1 echo completed this morning.  She should be treated with aspirin and clopidogrel for at least 3 months.  Should be okay for discharge after her echocardiogram is complete.  2.  Chronic systolic heart failure: Continue current doses of furosemide,  carvedilol, and Entresto.  Would hold Spironolactone until she has a follow-up metabolic panel next week since her creatinine is up a little from baseline and she is mildly hyperkalemic.  She follows in the heart failure clinic.  We will have to keep a close eye on her renal function and potassium.  She should have a follow-up metabolic panel next week.  Disposition: Okay for discharge today pending 2D echocardiogram this morning with close outpatient follow-up arranged.  For questions or updates, please contact [place] Please consult [email or web address] for contact info under        Signed, Sherren Mocha, MD  12/15/2018, 8:01 AM

## 2018-12-15 NOTE — Progress Notes (Signed)
Removed PIV access x 2 and pt received the discharge instructions. Pt understood well and took her all belongings. Pt's sister was picking her up. HS [employer]

## 2018-12-15 NOTE — Progress Notes (Signed)
CARDIAC REHAB PHASE I   PRE:  Rate/Rhythm: 77 SR  BP:  Supine: 94/53  Sitting:   Standing:    SaO2: 98%RA  MODE:  Ambulation: 340 ft   POST:  Rate/Rhythm: 97 SR  BP:  Supine:   Sitting: 112/66  Standing:    SaO2: 98%RA 0930-1015 Pt walked 340 ft on RA with steady gait and tolerated well. To recliner after walk. Gave pt CHF booklet and low sodium diets. Discussed importance of daily weights, 2000 mg sodium restriction, and ex ed. Discussed CRP 2 and gave brochure for [place]. Pt does not want referral at this time. Will discuss with Dr Aundra Dubin. Discussed with pt that her A1C is a little high and to watch carbs. Gave pt CHF booklet.    Graylon Good, RN BSN  12/15/2018 10:12 AM

## 2018-12-15 NOTE — Discharge Summary (Signed)
Discharge Summary    Patient ID: Vanessa [name] MRN: [id number]; DOB: 12/26/1950  Admit date: 12/14/2018 Discharge date: 12/15/2018  Primary Care Provider: Lennie Odor, PA-C  Primary Cardiologist: [name], MD  Primary Electrophysiologist:  None   Discharge Diagnoses    Active Problems:   Severe mitral insufficiency   Allergies No Known Allergies  Diagnostic Studies/Procedures    Echocardiogram 12/15/18 Study Conclusions - Left ventricle: The cavity size was severely dilated. Wall   thickness was normal. Systolic function was moderately to   severely reduced. The estimated ejection fraction was in the   range of 30% to 35%. Diffuse hypokinesis. Doppler parameters are   consistent with high ventricular filling pressure. - Mitral valve: Calcified annulus. The findings are consistent with   mild stenosis. There was mild regurgitation. Valve area by   pressure half-time: 2.25 cm^2. - Left atrium: The atrium was severely dilated. - Right ventricle: Systolic function was mildly reduced. - Atrial septum: There was an atrial septal aneurysm. - Pulmonary arteries: Systolic pressure was moderately increased.   PA peak pressure: 48 mm Hg (S).  Impressions: - Moderate to severe global reduction in LV systolic function;   severe LVE; elevated LV filling pressure; s/p mitraclip with mild   MR and mild MS (mean gradient 7 mmHg); severe LAE; mild RV   dysfunction; mild TR with moderate pulmonary hypertension.  _____________   MITRAL VALVE REPAIR 12/14/18   Successful percutaneous mitral valve repair using 2 MitraClip NTR devices, both positioned A2/P2  Recommendations   Antiplatelet/Anticoag Recommend uninterrupted dual antiplatelet therapy with Aspirin 81mg  daily and Clopidogrel 75mg  daily. At least 3 months without interruption   Procedure Performed: Ultrasound-guided right transfemoral venous access Double PreClose right femoral vein Transseptal puncture using  Bailess RF needle Mitral valve repair with MitraClip NTR x 2 devices  Surgeon: Blane Ohara, MD   Echocardiographer: Jenkins Rouge, MD  Anesthesiologist: Suann Larry, MD  Device Implant: Mitraclip NTR lot number [id number] and lot number [id number]  History of Present Illness     Vanessa [name] is a 68 year old woman with history of nonischemic cardiomyopathy. Patient has had a low EF recognized since at least 9/14, when echo showed EF 40-45%.  In 17-Jun-2023, her child died, which led to significant stress.  She developed worsening exertional dyspnea and subsequent echo showed a fall in EF to 25-30%.  She had left and right heart catheterization in 3/16 with no significant coronary disease detected and moderately elevated right and left heart filling pressures along with pulmonary venous hypertension.  Echo was done 12/16: EF 45%, diffuse hypokinesis. She had a repeat echo 11/17 => EF 45% with moderately dilated LV, moderate MR.  Echo 11/18 showed EF stable at 45% with mild LV dilation and mildly decreased RV systolic function, moderate MR.   Spironolactone was stopped due to elevated K. She apparently did not tolerate [place] well.   Echo was done in 11/19.  EF 30-35% with mild LV hypertrophy, mild-moderately decreased RV systolic function, possible severe MR with restricted posterior leaflet, PASP 50 mmHg, severe LAE.  TEE was done in 11/19 to assess mitral regurgitation.  This showed severe MR with restricted posterior leaflet.   She has been seen by Drs Burt Knack and Roxy Manns as part of Mitraclip evaluation.  From the notes, it appears that she would be a reasonable candidate but procedure has not yet been scheduled.   Preop echo: Severe global LV systolic dysfunction with LVEF approximately 30 to 35% Severe MR secondary  to restriction of the posterior mitral valve leaflet and Mal coaptation of the anterior and posterior leaflets with 4+ mitral regurgitation   Hospital Course       Consultants: none  1.  Severe symptomatic mitral regurgitation, stage D: Patient underwent MitraClip procedure yesterday with 2 clips placed.  She has done well with no immediate procedural complications. She should be treated with aspirin and clopidogrel for at least 3 months.    Echocardiogram today showed MitraClip with mild MR and mild MS (mean gradient 7 mmHg).  2.  Chronic systolic heart failure: Continue current doses of furosemide,  carvedilol, and Entresto.  Would hold Spironolactone until she has a follow-up metabolic panel next week since her creatinine is up a little from baseline and she is mildly hyperkalemic.  She follows in the heart failure clinic.  We will have to keep a close eye on her renal function and potassium.  She should have a follow-up metabolic panel next week, scheduled for next Tuesday.  Pt has ambulated with Cardiac Rehab and tolerated it well. She has been given education on low sodium diet, low carb diet and CHF booklet given. Pt has decline CR Phase 2.   Patient has been seen by Dr. Burt Knack today and deemed ready for discharge home. All follow up appointments have been scheduled. Discharge medications are listed below. _____________  Discharge Vitals Blood pressure (!) 93/58, pulse 64, temperature 98.9 F (37.2 C), temperature source Oral, resp. rate 15, height 5\' 2"  (1.575 m), weight 91.1 kg, SpO2 98 %.  Filed Weights   12/14/18 1108  Weight: 91.1 kg    Labs & Radiologic Studies    CBC Recent Labs    12/14/18 2024 12/15/18 0233  WBC 7.0 7.3  HGB 10.7* 10.1*  HCT 34.3* 32.0*  MCV 90.5 90.1  PLT 206 213   Basic Metabolic Panel Recent Labs    12/13/18 0845 12/14/18 1501 12/14/18 2024 12/15/18 0233  NA 139 141  --  139  K 4.4 5.0  --  5.3*  CL 108 111  --  109  CO2 24  --   --  22  GLUCOSE 79 130*  --  143*  BUN 42* 36*  --  46*  CREATININE 1.65* 1.30* 1.63* 1.72*  CALCIUM 9.2  --   --  8.4*   Liver Function Tests Recent Labs     12/13/18 0845  AST 11*  ALT 7  ALKPHOS 33*  BILITOT 2.3*  PROT 7.3  ALBUMIN 3.6   No results for input(s): LIPASE, AMYLASE in the last 72 hours. Cardiac Enzymes No results for input(s): CKTOTAL, CKMB, CKMBINDEX, TROPONINI in the last 72 hours. BNP Invalid input(s): POCBNP D-Dimer No results for input(s): DDIMER in the last 72 hours. Hemoglobin A1C Recent Labs    12/13/18 0844  HGBA1C 6.2*   Fasting Lipid Panel No results for input(s): CHOL, HDL, LDLCALC, TRIG, CHOLHDL, LDLDIRECT in the last 72 hours. Thyroid Function Tests No results for input(s): TSH, T4TOTAL, T3FREE, THYROIDAB in the last 72 hours.  Invalid input(s): FREET3 _____________  Dg Chest 2 View  Result Date: 12/13/2018 CLINICAL DATA:  Pre cardiac catheterization, history of severe mitral regurgitation EXAM: CHEST - 2 VIEW COMPARISON:  CT angio chest of 06/04/2015 and chest x-ray of 01/28/2015 FINDINGS: No active infiltrate or effusion is seen. There is some deviation of the tracheal air shadow to the right of midline superiorly. This could be due to enlargement of the thyroid gland. Clinical correlation is recommended.  Moderate cardiomegaly is stable. No acute bony abnormality is seen. IMPRESSION: 1. Stable cardiomegaly.  No active lung disease. 2. Question of enlarged thyroid deviating the tracheal air shadow to the right of midline. Electronically Signed   By: Ivar Drape M.D.   On: 12/13/2018 09:42   Disposition   Pt is being discharged home today in good condition.  Follow-up Plans & Appointments    Follow-up Information    Larey Dresser, MD. Go on 12/26/2018.   Specialty:  Cardiology Why:  at 0900 in the Advanced Heart Failure clinic.  Gate code is [phone] for January.  Please bring all medications to appt. Contact information: [place] [address] [phone]        [facility] Follow up.   Specialty:  Cardiology Why:  You have an echocardiogram scheduled for  01/18/2019 at 1:00. Contact information: [address], [address] [place]       Eileen Stanford, PA-C Follow up.   Specialties:  Cardiology, Radiology Why:  You have cardiology hospital follow-up on 01/18/2019 at 2:00 with the valve clinic.  Please arrive 15 minutes early for check-in. Contact information: [address] [place] [address] [phone]        [facility] Follow up.   Specialty:  Cardiology Why:  Please go to the heart failure clinic for labs on Tuesday 12/20/2018 between 8 and 12 or between 130 and 3.  Contact information: [address] [id number] [place] [phone]         Discharge Instructions    (HEART FAILURE PATIENTS) Call MD:  Anytime you have any of the following symptoms: 1) 3 pound weight gain in 24 hours or 5 pounds in 1 week 2) shortness of breath, with or without a dry hacking cough 3) swelling in the hands, feet or stomach 4) if you have to sleep on extra pillows at night in order to breathe.   Complete by:  As directed    Diet - low sodium heart healthy   Complete by:  As directed    Increase activity slowly   Complete by:  As directed       Discharge Medications   Allergies as of 12/15/2018   No Known Allergies     Medication List    STOP taking these medications   spironolactone 25 MG tablet Commonly known as:  ALDACTONE     TAKE these medications   aspirin 81 MG EC tablet Take 1 tablet (81 mg total) by mouth daily.   CALCIUM 600+D PO Take 1 tablet by mouth daily.   carvedilol 25 MG tablet Commonly known as:  COREG Take 1 tablet (25 mg total) by mouth 2 (two) times daily with a meal.   clopidogrel 75 MG tablet Commonly known as:  PLAVIX Take 1 tablet (75 mg total) by mouth daily with breakfast. Start taking on:  December 16, 2018   furosemide 40 MG tablet Commonly known as:   LASIX Take 1 tablet (40 mg total) by mouth daily. Start taking on:  December 16, 2018   ibandronate 150 MG tablet Commonly known as:  BONIVA Take 150 mg every 30 (thirty) days by mouth. Take in the morning with a full glass of water, on an empty stomach, and do not take anything else by mouth or lie down for the next 30 min.   sacubitril-valsartan 49-51  MG Commonly known as:  ENTRESTO Take 1 tablet by mouth 2 (two) times daily.        Acute coronary syndrome (MI, NSTEMI, STEMI, etc) this admission?: No.    Outstanding Labs/Studies   Metabolic panel on 3/35/33  Duration of Discharge Encounter   Greater than 30 minutes including physician time.  Signed, Daune Perch, NP 12/15/2018, 2:55 PM

## 2018-12-15 NOTE — Progress Notes (Signed)
  Echocardiogram 2D Echocardiogram has been performed.  Darlina Sicilian M 12/15/2018, 9:39 AM

## 2018-12-16 NOTE — Consult Note (Signed)
            [facility] CM Primary Care Navigator  12/16/2018  DEEMA JUNCAJ May 10, 1951 [id number]   Attempt to seepatient at the bedside to identify possible discharge needs but she wasalready dischargedhome per staff.  Per MD note,patienthas been seen by cardiologists Drs. Cooper and [place] as part of Mitraclip evaluation for severe symptomatic mitral regurgitation. (severe mitral valve insufficiency status post mitral valve repair with MitraClip, chronic systolic HF).She is closely followed at the Heart Failure clinic.   Patient has discharge instruction tofollow-up with cardiology on 12/26/18 (at Heart Failure clinic) and cardiology follow-up on 01/18/19.    Primary care provider's office is listed as providing transition of care (TOC) follow-up.   For additional questions please contact:  Edwena Felty A. Clanton Emanuelson, BSN, RN-BC [facility] PRIMARY CARE Navigator Cell: [phone]

## 2018-12-20 ENCOUNTER — Ambulatory Visit (HOSPITAL_COMMUNITY)
Admission: RE | Admit: 2018-12-20 | Discharge: 2018-12-20 | Disposition: A | Discharge status: Home / Self Care | Payer: PPO | Source: Ambulatory Visit | Attending: Cardiology | Admitting: Cardiology

## 2018-12-20 DIAGNOSIS — I5022 Chronic systolic (congestive) heart failure: Secondary | ICD-10-CM | POA: Diagnosis not present

## 2018-12-20 LAB — BASIC METABOLIC PANEL
ANION GAP: 8 (ref 5–15)
BUN: 44 mg/dL — ABNORMAL HIGH (ref 8–23)
CALCIUM: 9.2 mg/dL (ref 8.9–10.3)
CO2: 22 mmol/L (ref 22–32)
Chloride: 109 mmol/L (ref 98–111)
Creatinine, Ser: 1.68 mg/dL — ABNORMAL HIGH (ref 0.44–1.00)
GFR calc Af Amer: 36 mL/min — ABNORMAL LOW (ref >60)
GFR calc non Af Amer: 31 mL/min — ABNORMAL LOW (ref >60)
Glucose, Bld: 117 mg/dL — ABNORMAL HIGH (ref 70–99)
Potassium: 4.8 mmol/L (ref 3.5–5.1)
Sodium: 139 mmol/L (ref 135–145)

## 2018-12-26 ENCOUNTER — Other Ambulatory Visit: Payer: Self-pay

## 2018-12-26 ENCOUNTER — Ambulatory Visit (HOSPITAL_COMMUNITY)
Admission: RE | Admit: 2018-12-26 | Discharge: 2018-12-26 | Disposition: A | Discharge status: Home / Self Care | Payer: PPO | Source: Ambulatory Visit | Attending: Cardiology | Admitting: Cardiology

## 2018-12-26 ENCOUNTER — Encounter (HOSPITAL_COMMUNITY): Payer: Self-pay | Admitting: Cardiology

## 2018-12-26 VITALS — BP 118/66 | HR 79 | Wt 202.4 lb

## 2018-12-26 DIAGNOSIS — I428 Other cardiomyopathies: Secondary | ICD-10-CM | POA: Insufficient documentation

## 2018-12-26 DIAGNOSIS — I130 Hypertensive heart and chronic kidney disease with heart failure and stage 1 through stage 4 chronic kidney disease, or unspecified chronic kidney disease: Secondary | ICD-10-CM | POA: Insufficient documentation

## 2018-12-26 DIAGNOSIS — I5022 Chronic systolic (congestive) heart failure: Secondary | ICD-10-CM | POA: Diagnosis not present

## 2018-12-26 DIAGNOSIS — Z7982 Long term (current) use of aspirin: Secondary | ICD-10-CM | POA: Diagnosis not present

## 2018-12-26 DIAGNOSIS — Z79899 Other long term (current) drug therapy: Secondary | ICD-10-CM | POA: Diagnosis not present

## 2018-12-26 DIAGNOSIS — I272 Pulmonary hypertension, unspecified: Secondary | ICD-10-CM | POA: Insufficient documentation

## 2018-12-26 DIAGNOSIS — E875 Hyperkalemia: Secondary | ICD-10-CM | POA: Diagnosis not present

## 2018-12-26 DIAGNOSIS — I340 Nonrheumatic mitral (valve) insufficiency: Secondary | ICD-10-CM | POA: Diagnosis not present

## 2018-12-26 DIAGNOSIS — I493 Ventricular premature depolarization: Secondary | ICD-10-CM | POA: Insufficient documentation

## 2018-12-26 DIAGNOSIS — N183 Chronic kidney disease, stage 3 (moderate): Secondary | ICD-10-CM | POA: Diagnosis not present

## 2018-12-26 MED ORDER — ISOSORB DINITRATE-HYDRALAZINE 20-37.5 MG PO TABS: 0.5000 | ORAL_TABLET | Freq: Three times a day (TID) | ORAL | 2 refills | Status: DC

## 2018-12-26 NOTE — Progress Notes (Signed)
Patient ID: Vanessa [name], female   DOB: July 20, 1951, 68 y.o.   MRN: [id number]    Advanced Heart Failure Clinic Note   PCP: Lennie Odor Primary Cardiologist: Dr. Radford Pax HF Cardiology: Dr. Aundra Dubin  68 y.o. with history of nonischemic cardiomyopathy presents for followup of CHF. Patient has had a low EF recognized since at least 9/14, when echo showed EF 40-45%.  In 2023-07-07, her child died, which led to significant stress.  She developed worsening exertional dyspnea and subsequent echo showed a fall in EF to 25-30%.  She had left and right heart catheterization in 3/16 with no significant coronary disease detected and moderately elevated right and left heart filling pressures along with pulmonary venous hypertension.  Echo was done 12/16: EF 45%, diffuse hypokinesis. She had a repeat echo 11/17 => EF 45% with moderately dilated LV, moderate MR.  Echo 11/18 showed EF stable at 45% with mild LV dilation and mildly decreased RV systolic function, moderate MR.   Spironolactone was stopped due to elevated K. She apparently did not tolerate Veltassa well.   Echo was done in 11/19.  EF 30-35% with mild LV hypertrophy, mild-moderately decreased RV systolic function, possible severe MR with restricted posterior leaflet, PASP 50 mmHg, severe LAE.  TEE was done in 11/19 to assess mitral regurgitation.  This showed severe MR with restricted posterior leaflet.   In 1/20, she had Mitraclip with reduction in MR to 1+.    She returns for followup of CHF.  She is off spironolactone with elevated K.  No dyspnea walking on flat ground but has not been very active since Mitraclip procedure last week.  No orthopnea/PND.  Weight is down 5 lbs.    ECG (personally reviewed): NSR, inferior and anterolateral TWIs  Labs (7/16): K 4.5, creatinine 1.12, BNP 596 Labs (8/16): K 4 => 5.1, creatinine 1.29 => 1.5, BNP 388 => 426 Labs (9/16): K 4, creatinine 1.08, BNP 502 Labs (12/16): K 4.9, creatinine 1.57 Labs (5/17): K 5.2,  creatinine 1.3 Labs (8/17): BNP 152 Labs (11/17): K 4.9, creatinine 1.32, BNP 236 Labs (8/18): K 4.6, creatinine 1.57 Labs (11/18): K 3.8, creatinine 1.29 Labs (2/19): K 5.1, creatinine 1.44 Labs (9/19): K 3.8, creatinine 1.08 Labs (11/19): K 4.3, creatinine 1.3 Labs (1/20): K 4.8, creatinine 1.68  PMH: 1. Cardiomyopathy: Nonischemic.  Echo (9/14) with EF 40-45%.  Echo (2/16) with EF 25-30%.  Echo (4/16) with EF 30-35%, moderate MR.  Echo (6/16) with EF 40%, moderate LV dilation, moderate diastolic dysfunction, moderate MR, moderate TR, PA systolic pressure 52 mmHg. RHC/LHC (3/16) with no significant CAD, EF 20-25%, 3+ MR; mean RA 13, PA 70/30 mean 45, mean PCWP 31 mmHg, CI 2.2, PVR 3.1 WU.  Echo (12/16) with EF 45%, diffuse hypokinesis, moderate MR, normal RV size and systolic function.  - Echo (11/17): EF 45%, moderately dilated LV, moderate eccentric posterior MR, PASP 42 mmHg, normal RV size and systolic function.  - Echo (11/18): EF 45%, mild LV dilation, moderate diastolic dysfunction, normal RV size with mildly decreased systolic function, PASP 50 mmHg, moderate MR.  - Echo (11/19): EF 30-35%, mild LV dilation, severe LAE, mild-moderately decreased RV systolic function, possible severe MR with restricted posterior leaflet, PASP 50 mmHg.  - Echo (1/20): EF 30-35%, severe LV dilation, s/p Mitraclip with mild MR and mild MS, PASP 48 mmHg.  - RHC (1/20, with Mitraclip): mean RA 10, mean LA 18 2. PVCs: Holter (10/15) with 12% PVCs. 3. Pulmonary venous hypertension: CTA chest (7/16)  negative for PE. PFTs (7/16) with minimal obstruction, mild restriction, moderately decreased DLCO.  4. Thyroid nodules: Biopsy benign.  5. CKD stage 3 6. Mitral regurgitation: TEE (11/19) with EF 30-35%, moderate LV dilation, severe MR with posterior leaflet restriction and ERO 0.41 cm^2, no mitral stenosis, mild to moderately decreased RV systolic function, peak RV-RA gradient 50 mmHg.  - Mitraclip placement x 2  in 1/20 with 1+ residual MR.   SH: Lives alone, only child passed away in 06/20/2023, nonsmoker, no ETOH or drugs.   FH: Father with MIs  ROS: All systems reviewed and negative except as per HPI.   Current Outpatient Medications  Medication Sig Dispense Refill  . aspirin 81 MG EC tablet Take 1 tablet (81 mg total) by mouth daily.    . Calcium Carbonate-Vitamin D (CALCIUM 600+D PO) Take 1 tablet by mouth daily.    . carvedilol (COREG) 25 MG tablet Take 1 tablet (25 mg total) by mouth 2 (two) times daily with a meal. 180 tablet 2  . clopidogrel (PLAVIX) 75 MG tablet Take 1 tablet (75 mg total) by mouth daily with breakfast. 90 tablet 2  . furosemide (LASIX) 40 MG tablet Take 1 tablet (40 mg total) by mouth daily. 30 tablet 5  . ibandronate (BONIVA) 150 MG tablet Take 150 mg every 30 (thirty) days by mouth. Take in the morning with a full glass of water, on an empty stomach, and do not take anything else by mouth or lie down for the next 30 min.    . sacubitril-valsartan (ENTRESTO) 49-51 MG Take 1 tablet by mouth 2 (two) times daily. 60 tablet 3  . isosorbide-hydrALAZINE (BIDIL) 20-37.5 MG tablet Take 0.5 tablets by mouth 3 (three) times daily. 30 tablet 2   No current facility-administered medications for this encounter.    BP 118/66   Pulse 79   Wt 91.8 kg (202 lb 6 oz)   SpO2 97%   BMI 37.01 kg/m    Wt Readings from Last 3 Encounters:  12/26/18 91.8 kg (202 lb 6 oz)  12/14/18 91.1 kg (200 lb 14.4 oz)  12/13/18 91.1 kg (200 lb 14.4 oz)   General: NAD Neck: No JVD, goiter noted Lungs: Clear to auscultation bilaterally with normal respiratory effort. CV: Nondisplaced PMI.  Heart regular S1/S2, no S3/S4, 2/6 HSM apex.  No peripheral edema.  No carotid bruit.  Normal pedal pulses.  Abdomen: Soft, nontender, no hepatosplenomegaly, no distention.  Skin: Intact without lesions or rashes.  Neurologic: Alert and oriented x 3.  Psych: Normal affect. Extremities: No clubbing or cyanosis.    HEENT: Normal.   Assessment/Plan: 1. Chronic systolic CHF: Nonischemic cardiomyopathy.  EF up to 45% on 12/16, repeat echo 11/18 shows that EF remains about 45%.  Etiology of NICM still unclear. Had frequent PVCs by past holter (12%) but doesn't explain her cardiomyopathy. Had improvement of palpitations on Coreg. Cannot rule out myocarditis. May also have component of Takotsubo with passing of child but EF has not improved as completely as would be expected with Takotsubo. Have previously discussed cardiac MRI to look for infiltrative disease but she says that she would be too claustrophobic.  Echo in 11/19 showed EF down to 30-35% and mild to moderate RV systolic function.  There also appeared to be severe mitral regurgitation, which seemed progressive from the past. TEE in 11/19 confirmed severe MR with restricted posterior leaflet => Mitraclip 1/20 with decrease in MR to 1+.  Echo in 1/20 showed EF 30-35% with  mild MR s/p Mitraclip. NYHA class II-III symptoms, she is not volume overloaded.  - Continue Lasix 40 mg daily.  - She is off spironolactone with recent hyperkalemia.   - Continue Coreg 25 mg bid.  - Continue Entresto 49/51 bid.   - Add Bidil 1/2 tab tid.  - If K rises again, she did not tolerate Veltassa so would try Lokelma.  - I will arrange for cardiac rehab.   - Repeat echo in 7/19.  If EF remains < 35%, will need to consider ICD.  2. PVCs: Frequent (12%) on 10/15 holter.   Pt refused repeat holter monitor for further assessment. PVC count was probably not high enough to cause cardiomyopathy, unlikely primary etiology. - Improved palpitations on Coreg.   3. Pulmonary hypertension: Primarily pulmonary venous HTN (PVR 3.1) from elevated left atrial pressure (previous RHC above).   - Best treatment remains adequate diuresis. 4. Mitral regurgitation:  TEE in 11/19 with severe MR, restricted posterior leaflet.  She had Mitraclip in 1/20. Post-op echo showed mild MR and mild mitral  stenosis.   Followup 6 wks.   [name]  12/26/2018

## 2018-12-26 NOTE — Patient Instructions (Addendum)
Begin taking Bidil 1/2 tablet three times a day (1.5 tablets daily), we have supplied you with 48 tablets of samples.   You have been referred to Cardiac Rehabilitation, they will contact you to schedule this appointment.   Your physician recommends that you schedule a follow-up appointment in 6 weeks

## 2018-12-26 NOTE — Progress Notes (Signed)
Bidil 20/37.5mg  12 tablet (4 bottles) samples given to patient.

## 2018-12-29 ENCOUNTER — Encounter (HOSPITAL_COMMUNITY): Payer: PPO

## 2018-12-29 ENCOUNTER — Encounter: Payer: Self-pay | Admitting: Physician Assistant

## 2019-01-02 ENCOUNTER — Encounter (HOSPITAL_COMMUNITY): Payer: PPO

## 2019-01-16 ENCOUNTER — Encounter: Payer: Self-pay | Admitting: Thoracic Surgery (Cardiothoracic Vascular Surgery)

## 2019-01-17 ENCOUNTER — Other Ambulatory Visit: Payer: Self-pay | Admitting: Physician Assistant

## 2019-01-17 DIAGNOSIS — Z1231 Encounter for screening mammogram for malignant neoplasm of breast: Secondary | ICD-10-CM

## 2019-01-17 NOTE — Progress Notes (Signed)
[facility]                                       Cardiology Office Note    Date:  01/18/2019   ID:  [name], DOB March 16, 1951, MRN [id number]  PCP:  Lennie Odor, PA-C  Cardiologist:  Dr. Aundra Dubin   CC: 1 month s/p MitraClip  History of Present Illness:  Vanessa [name] is a 68 y.o. female with a history of morbid obesity, NICM, PVCs, pulm HTN, CKD stage III and severe MR s/p MitraClip (12/14/18) who presents to clinic for follow up.   Patient's cardiac history dates back nearly 10 years ago when she was first evaluated for symptoms of exertional shortness of breath and fatigue.  She states that she was first told that she had a heart murmur by Dr. Dannielle Burn. She was diagnosed with nonischemic cardiomyopathy, hypertension, and mitral regurgitation. She was followed for several years by Dr. Radford Pax.  Catheterization performed in 2016 revealed normal coronary arteries.  Ejection fraction was estimated 20 to 25%. Transthoracic echocardiogram revealed moderate (3+) mitral regurgitation and moderate pulmonary hypertension.  She was referred to the pulmonary medicine clinic where she was evaluated by Dr. Halford Chessman felt that the patient's pulmonary hypertension was likely related to her underlying heart failure and mitral valve disease.  She was referred to the advanced heart failure clinic and has been followed carefully ever since by Dr. Aundra Dubin.  Most recent follow-up transthoracic echocardiogram performed October 03, 2018 revealed diffuse global left ventricular hypokinesis with ejection fraction estimated 30 to 35% in the setting of severe mitral regurgitation.  There was moderate pulmonary hypertension and severe left atrial enlargement.  Right ventricular size was felt to be normal and there was trivial tricuspid regurgitation but there was mild to moderate right ventricular systolic dysfunction.  TEE was performed October 11, 2018 and confirmed the  presence of severe mitral regurgitation. Left ventricular systolic function was moderate to severely reduced with ejection fraction estimated 30 to 35% in the setting of severe mitral regurgitation.  The patient was referred to the multidisciplinary heart valve clinic and has been evaluated by Dr. Burt Knack and Dr. Roxy Manns.   She underwent successful percutaneous mitral valve repair using 2 MitraClip NTR devices, both positioned A2/P2. Post op echo showed MitraClip with mild MR and mild MS (mean gradient 7 mmHg). Arlyce Harman was discontinued given hyperkalemia. She was discharged on aspirin and plavix.   She was seen by Dr. Aundra Dubin in follow up and started on Bidil and encouraged cardiac rehab.  Today she presents to clinic for follow up. She started feeling "crummy" after taking the Bidil and has only been taking 1/2 a tablet once a day and has done well with that. No CP or SOB. No LE edema, orthopnea or PND. No dizziness or syncope. No blood in stool or urine. No palpitations. She can do all of her daily activities with no issues. She only gets shortness of breath with long walks or moderate exertion. 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   Past Medical History:  Diagnosis Date  . Dysrhythmia    PVC's  . Hypertension   . Moderate mitral regurgitation    moderate to severe MR with moderate pulmonary HTN  . Nonischemic cardiomyopathy ([place])    EF 30-35% by echo 2015  .  Pulmonary HTN ([place])    moderate with PASP 54mmHg by echo 2015  . PVC's (premature ventricular contractions)     Past Surgical History:  Procedure Laterality Date  . CARDIAC CATHETERIZATION  2003   normal coronary arteries  . CARDIAC CATHETERIZATION  2013  Nov   h/o nonischemic DCM EF 35-40% increase LVEDP   . HYSTEROSCOPY W/D&C  12/02/2012   Procedure: DILATATION AND CURETTAGE /HYSTEROSCOPY;  Surgeon: Maeola Sarah. Landry Mellow, MD;  Location: [facility];  Service: Gynecology;  Laterality: N/A;  cervical polypectomy  . LEFT  AND RIGHT HEART CATHETERIZATION WITH CORONARY ANGIOGRAM N/A 02/15/2015   Procedure: LEFT AND RIGHT HEART CATHETERIZATION WITH CORONARY ANGIOGRAM;  Surgeon: Jettie Booze, MD;  Location: [facility] CATH LAB;  Service: Cardiovascular;  Laterality: N/A;  . MITRAL VALVE REPAIR N/A 12/14/2018   Procedure: MITRAL VALVE REPAIR;  Surgeon: Sherren Mocha, MD;  Location: [facility];  Service: Cardiovascular;  Laterality: N/A;  . TEE WITHOUT CARDIOVERSION N/A 10/11/2018   Procedure: TRANSESOPHAGEAL ECHOCARDIOGRAM (TEE);  Surgeon: Larey Dresser, MD;  Location: [facility] ENDOSCOPY;  Service: Cardiovascular;  Laterality: N/A;    Current Medications: Outpatient Medications Prior to Visit  Medication Sig Dispense Refill  . aspirin 81 MG EC tablet Take 1 tablet (81 mg total) by mouth daily.    . Calcium Carbonate-Vitamin D (CALCIUM 600+D PO) Take 1 tablet by mouth daily.    . carvedilol (COREG) 25 MG tablet Take 1 tablet (25 mg total) by mouth 2 (two) times daily with a meal. 180 tablet 2  . clopidogrel (PLAVIX) 75 MG tablet Take 1 tablet (75 mg total) by mouth daily with breakfast. 90 tablet 2  . furosemide (LASIX) 40 MG tablet Take 1 tablet (40 mg total) by mouth daily. 30 tablet 5  . ibandronate (BONIVA) 150 MG tablet Take 150 mg every 30 (thirty) days by mouth. Take in the morning with a full glass of water, on an empty stomach, and do not take anything else by mouth or lie down for the next 30 min.    . isosorbide-hydrALAZINE (BIDIL) 20-37.5 MG tablet Take 0.5 tablets by mouth 3 (three) times daily. 30 tablet 2  . sacubitril-valsartan (ENTRESTO) 49-51 MG Take 1 tablet by mouth 2 (two) times daily. 60 tablet 3   No facility-administered medications prior to visit.      Allergies:   Patient has no known allergies.   Social History   Socioeconomic History  . Marital status: Single    Spouse name: Not on file  . Number of children: Not on file  . Years of education: Not on file  . Highest education  level: Not on file  Occupational History  . Occupation: Retired    Comment: Tourist information centre manager, [employer]  . Financial resource strain: Not on file  . Food insecurity:    Worry: Not on file    Inability: Not on file  . Transportation needs:    Medical: Not on file    Non-medical: Not on file  Tobacco Use  . Smoking status: Never Smoker  . Smokeless tobacco: Never Used  Substance and Sexual Activity  . Alcohol use: No    Alcohol/week: 0.0 standard drinks  . Drug use: No  . Sexual activity: Not on file  Lifestyle  . Physical activity:    Days per week: Not on file    Minutes per session: Not on file  . Stress: Not on file  Relationships  . Social connections:  Talks on phone: Not on file    Gets together: Not on file    Attends religious service: Not on file    Active member of club or organization: Not on file    Attends meetings of clubs or organizations: Not on file    Relationship status: Not on file  Other Topics Concern  . Not on file  Social History Narrative  . Not on file     Family History:  The patient's family history includes Breast cancer (age of onset: 82) in her sister; Breast cancer (age of onset: 43) in her mother; Heart disease in her father.      ROS:   Please see the history of present illness.    ROS All other systems reviewed and are negative.   PHYSICAL EXAM:   VS:  BP (!) 94/54   Pulse 66   Ht 5\' 2"  (1.575 m)   Wt 202 lb 6.4 oz (91.8 kg)   SpO2 97%   BMI 37.02 kg/m    GEN: Well nourished, well developed, in no acute distress HEENT: normal Neck: no JVD or masses Cardiac: RRR; 2/6 holosytolic murmur at apex. No rubs, or gallops,no edema  Respiratory:  clear to auscultation bilaterally, normal work of breathing GI: soft, nontender, nondistended, + BS MS: no deformity or atrophy Skin: warm and dry, no rash Neuro:  Alert and Oriented x 3, Strength and sensation are intact Psych: euthymic mood, full affect   Wt  Readings from Last 3 Encounters:  01/18/19 202 lb 6.4 oz (91.8 kg)  12/26/18 202 lb 6 oz (91.8 kg)  12/14/18 200 lb 14.4 oz (91.1 kg)      Studies/Labs Reviewed:   EKG:  EKG is NOT ordered today.    Recent Labs: 05/24/2018: TSH 0.633 12/13/2018: ALT 7; B Natriuretic Peptide 618.4 12/15/2018: Hemoglobin 10.1; Platelets 211 12/20/2018: BUN 44; Creatinine, Ser 1.68; Potassium 4.8; Sodium 139   Lipid Panel    Component Value Date/Time   CHOL 193 11/13/2008 2027   TRIG 116 11/13/2008 2027   HDL 55 11/13/2008 2027   CHOLHDL 3.5 Ratio 11/13/2008 2027   VLDL 23 11/13/2008 2027   LDLCALC 115 (H) 11/13/2008 2027    Additional studies/ records that were reviewed today include:  Echocardiogram 12/15/18 Study Conclusions - Left ventricle: The cavity size was severely dilated. Wall thickness was normal. Systolic function was moderately to severely reduced. The estimated ejection fraction was in the range of 30% to 35%. Diffuse hypokinesis. Doppler parameters are consistent with high ventricular filling pressure. - Mitral valve: Calcified annulus. The findings are consistent with mild stenosis. There was mild regurgitation. Valve area by pressure half-time: 2.25 cm^2. - Left atrium: The atrium was severely dilated. - Right ventricle: Systolic function was mildly reduced. - Atrial septum: There was an atrial septal aneurysm. - Pulmonary arteries: Systolic pressure was moderately increased. PA peak pressure: 48 mm Hg (S). Impressions: - Moderate to severe global reduction in LV systolic function; severe LVE; elevated LV filling pressure; s/p mitraclip with mild MR and mild MS (mean gradient 7 mmHg); severe LAE; mild RV dysfunction; mild TR with moderate pulmonary hypertension.  _____________   MITRAL VALVE REPAIR 12/14/18   Successful percutaneous mitral valve repair using 2 MitraClip NTR devices, both positioned A2/P2  Recommendations   Antiplatelet/Anticoag  Recommend uninterrupted dual antiplatelet therapy with Aspirin 81mg  daily and Clopidogrel 75mg  daily. At least 3 months without interruption   _____________   Echo 01/18/19 IMPRESSIONS  1. The left ventricle  has moderate-severely reduced systolic function, with an ejection fraction of 30-35%. The cavity size was normal. There is mildly increased left ventricular wall thickness. Left ventricular diastolic Doppler parameters are  consistent with impaired relaxation Left ventricular diffuse hypokinesis.  2. The tricuspid valve is normal in structure.  3. The aortic valve is tricuspid no stenosis of the aortic valve.  4. The aortic root and ascending aorta are normal in size and structure.  5. - MitraClip: Status post Mitraclip placement. Mild mitral stenosis with mean gradient 5 mmHg, MVA by PHT 1.54 cm^2. Moderate residual mitral regurgitation.  6. Right atrial size was mildly dilated.  7. Left atrial size was moderately dilated.  8. Normal IVC size. PA systolic pressure 56 mmHg.   ASSESSMENT & PLAN:   Severe MR s/p MitraClip: doing excellent since her MitraClip. 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 Her groin site has completely healed. Echo today shows EF 30-35%, normally functioning MitraClip with moderate mitral regurgitation with mild mitral stenosis (mean gradient 5 mm Hg). She has NHYA class I symptoms and feels like she has had an improvement in her symptoms since having Mitraclip placed. SBE prophylaxis discussed; I have RX'd amoxicillin. She will continue on aspirin and plavix x 3 months and then discontinue plavix.   Chronic combined S/D CHF: appears euvolemic. She was recently started on Bidil by Dr. Aundra Dubin. She hasn't tolerated this well and only taking it once instead of three time a day. Her BP is low today at 94/54. I told her to continue taking it once a day or stop completely at this time given low Bps. She will discuss with Dr. Aundra Dubin at their next visit.     Medication Adjustments/Labs and Tests Ordered: Current medicines are reviewed at length with the patient today.  Concerns regarding medicines are outlined above.  Medication changes, Labs and Tests ordered today are listed in the Patient Instructions below. Patient Instructions  Medication Instructions:  Your physician recommends that you continue on your current medications as directed. Please refer to the Current Medication list given to you today. You can stop Clopidogrel on April 15,2020 If you need a refill on your cardiac medications before your next appointment, please call your pharmacy.   Lab work: none If you have labs (blood work) drawn today and your tests are completely normal, you will receive your results only by: Marland Kitchen MyChart Message (if you have MyChart) OR . A paper copy in the mail If you have any lab test that is abnormal or we need to change your treatment, we will call you to review the results.  Testing/Procedures: Your physician has requested that you have an echocardiogram. Echocardiography is a painless test that uses sound waves to create images of your heart. It provides your doctor with information about the size and shape of your heart and how well your heart's chambers and valves are working. This procedure takes approximately one hour. There are no restrictions for this procedure.  Scheduled for January 14,2021  Follow-Up: Follow up with Dr. Aundra Dubin as planned You are scheduled to see K. [place], [place] on January 14,2021  Your physician discussed the importance of taking an antibiotic prior to any dental, gastrointestinal, genitourinary procedures to prevent damage to the heart valves from infection.       Signed, Angelena Form, PA-C  01/18/2019 5:19 PM    [facility] [address], [place], [place]  [address] Phone: [phone]; Fax: [phone]

## 2019-01-18 ENCOUNTER — Encounter (INDEPENDENT_AMBULATORY_CARE_PROVIDER_SITE_OTHER): Payer: Self-pay

## 2019-01-18 ENCOUNTER — Encounter: Payer: Self-pay | Admitting: Physician Assistant

## 2019-01-18 ENCOUNTER — Ambulatory Visit (HOSPITAL_COMMUNITY): Payer: PPO | Attending: Cardiology

## 2019-01-18 ENCOUNTER — Other Ambulatory Visit (HOSPITAL_COMMUNITY): Payer: PPO

## 2019-01-18 ENCOUNTER — Ambulatory Visit: Payer: PPO | Admitting: Physician Assistant

## 2019-01-18 VITALS — BP 94/54 | HR 66 | Ht 62.0 in | Wt 202.4 lb

## 2019-01-18 DIAGNOSIS — I340 Nonrheumatic mitral (valve) insufficiency: Secondary | ICD-10-CM | POA: Diagnosis not present

## 2019-01-18 DIAGNOSIS — Z9889 Other specified postprocedural states: Secondary | ICD-10-CM | POA: Diagnosis not present

## 2019-01-18 DIAGNOSIS — I5042 Chronic combined systolic (congestive) and diastolic (congestive) heart failure: Secondary | ICD-10-CM

## 2019-01-18 MED ORDER — AMOXICILLIN 500 MG PO TABS: ORAL_TABLET | ORAL | 3 refills | Status: DC

## 2019-01-18 NOTE — Patient Instructions (Addendum)
Medication Instructions:  Your physician recommends that you continue on your current medications as directed. Please refer to the Current Medication list given to you today. You can stop Clopidogrel on April 15,2020 If you need a refill on your cardiac medications before your next appointment, please call your pharmacy.   Lab work: none If you have labs (blood work) drawn today and your tests are completely normal, you will receive your results only by: Marland Kitchen MyChart Message (if you have MyChart) OR . A paper copy in the mail If you have any lab test that is abnormal or we need to change your treatment, we will call you to review the results.  Testing/Procedures: Your physician has requested that you have an echocardiogram. Echocardiography is a painless test that uses sound waves to create images of your heart. It provides your doctor with information about the size and shape of your heart and how well your heart's chambers and valves are working. This procedure takes approximately one hour. There are no restrictions for this procedure.  Scheduled for January 14,2021  Follow-Up: Follow up with Dr. Aundra Dubin as planned You are scheduled to see K. [place], [place] on January 14,2021  Your physician discussed the importance of taking an antibiotic prior to any dental, gastrointestinal, genitourinary procedures to prevent damage to the heart valves from infection.

## 2019-01-19 ENCOUNTER — Other Ambulatory Visit (HOSPITAL_COMMUNITY): Payer: PPO

## 2019-01-24 ENCOUNTER — Telehealth (HOSPITAL_COMMUNITY): Payer: Self-pay

## 2019-01-24 NOTE — Telephone Encounter (Signed)
Called patient to see if she was interested in participating in the Cardiac Rehab Program. Patient stated yes. Patient will come in for orientation on 02/16/2019 @ 730AM and will attend the 8:15 exercise class. Went over insurance, patient verbalized understanding.   Mailed homework package.

## 2019-01-24 NOTE — Telephone Encounter (Signed)
Pt insurance is active and benefits verified through [facility]. Co-pay $15.00, DED $0.00/$0.00 met, out of pocket $3,400.00/$3030.46 met, co-insurance 0%. No pre-authorization required. Vanessa [name]/[facility] , 01/24/2019 @ 3:22PM , REF# [id number]

## 2019-02-03 ENCOUNTER — Encounter: Payer: Self-pay | Admitting: Thoracic Surgery (Cardiothoracic Vascular Surgery)

## 2019-02-08 ENCOUNTER — Other Ambulatory Visit: Payer: Self-pay

## 2019-02-08 ENCOUNTER — Ambulatory Visit (HOSPITAL_COMMUNITY)
Admission: RE | Admit: 2019-02-08 | Discharge: 2019-02-08 | Disposition: A | Discharge status: Home / Self Care | Payer: PPO | Source: Ambulatory Visit | Attending: Cardiology | Admitting: Cardiology

## 2019-02-08 ENCOUNTER — Encounter (HOSPITAL_COMMUNITY): Payer: Self-pay | Admitting: Cardiology

## 2019-02-08 VITALS — BP 118/72 | HR 76 | Wt 206.2 lb

## 2019-02-08 DIAGNOSIS — I5022 Chronic systolic (congestive) heart failure: Secondary | ICD-10-CM | POA: Diagnosis not present

## 2019-02-08 DIAGNOSIS — I130 Hypertensive heart and chronic kidney disease with heart failure and stage 1 through stage 4 chronic kidney disease, or unspecified chronic kidney disease: Secondary | ICD-10-CM | POA: Insufficient documentation

## 2019-02-08 DIAGNOSIS — Z79899 Other long term (current) drug therapy: Secondary | ICD-10-CM | POA: Diagnosis not present

## 2019-02-08 DIAGNOSIS — E875 Hyperkalemia: Secondary | ICD-10-CM | POA: Diagnosis not present

## 2019-02-08 DIAGNOSIS — N183 Chronic kidney disease, stage 3 (moderate): Secondary | ICD-10-CM | POA: Diagnosis not present

## 2019-02-08 DIAGNOSIS — I272 Pulmonary hypertension, unspecified: Secondary | ICD-10-CM | POA: Diagnosis not present

## 2019-02-08 DIAGNOSIS — I428 Other cardiomyopathies: Secondary | ICD-10-CM | POA: Insufficient documentation

## 2019-02-08 DIAGNOSIS — Z7982 Long term (current) use of aspirin: Secondary | ICD-10-CM | POA: Insufficient documentation

## 2019-02-08 DIAGNOSIS — I493 Ventricular premature depolarization: Secondary | ICD-10-CM | POA: Insufficient documentation

## 2019-02-08 DIAGNOSIS — I340 Nonrheumatic mitral (valve) insufficiency: Secondary | ICD-10-CM | POA: Diagnosis not present

## 2019-02-08 DIAGNOSIS — Z9889 Other specified postprocedural states: Secondary | ICD-10-CM | POA: Diagnosis not present

## 2019-02-08 LAB — BASIC METABOLIC PANEL
Anion gap: 8 (ref 5–15)
BUN: 27 mg/dL — ABNORMAL HIGH (ref 8–23)
CO2: 25 mmol/L (ref 22–32)
CREATININE: 1.61 mg/dL — AB (ref 0.44–1.00)
Calcium: 9.1 mg/dL (ref 8.9–10.3)
Chloride: 110 mmol/L (ref 98–111)
GFR calc Af Amer: 38 mL/min — ABNORMAL LOW (ref >60)
GFR, EST NON AFRICAN AMERICAN: 33 mL/min — AB (ref >60)
Glucose, Bld: 112 mg/dL — ABNORMAL HIGH (ref 70–99)
Potassium: 4.5 mmol/L (ref 3.5–5.1)
Sodium: 143 mmol/L (ref 135–145)

## 2019-02-08 MED ORDER — FUROSEMIDE 40 MG PO TABS: ORAL_TABLET | ORAL | 6 refills | Status: DC

## 2019-02-08 MED ORDER — AMOXICILLIN 500 MG PO TABS: ORAL_TABLET | ORAL | 0 refills | Status: DC

## 2019-02-08 MED ORDER — CARVEDILOL 25 MG PO TABS: 25.0000 mg | ORAL_TABLET | Freq: Two times a day (BID) | ORAL | 2 refills | Status: DC

## 2019-02-08 NOTE — Patient Instructions (Addendum)
Lab work done today. We will call you for any abnormal lab work.  Lab work will need to be done again in 2 weeks.   INCREASE Furosemide 40mg  (1 tab) twice daily FOR THREE DAYS ONLY. THEN START Furosemide 40mg  (1 tab) every morning and 20mg  (0.5 tab) every evening.  Prescription for amoxicillin refilled.  Follow up with Dr. Aundra Dubin in 2 months  Follow up with the Advanced Practice Provider in 3 months

## 2019-02-08 NOTE — Progress Notes (Signed)
Patient ID: Vanessa [name], female   DOB: 1951-05-31, 68 y.o.   MRN: [id number]    Advanced Heart Failure Clinic Note   PCP: Lennie Odor Primary Cardiologist: Dr. Radford Pax HF Cardiology: Dr. Aundra Dubin  68 y.o. with history of nonischemic cardiomyopathy presents for followup of CHF. Patient has had a low EF recognized since at least 9/14, when echo showed EF 40-45%.  In 07-05-2023, her child died, which led to significant stress.  She developed worsening exertional dyspnea and subsequent echo showed a fall in EF to 25-30%.  She had left and right heart catheterization in 3/16 with no significant coronary disease detected and moderately elevated right and left heart filling pressures along with pulmonary venous hypertension.  Echo was done 12/16: EF 45%, diffuse hypokinesis. She had a repeat echo 11/17 => EF 45% with moderately dilated LV, moderate MR.  Echo 11/18 showed EF stable at 45% with mild LV dilation and mildly decreased RV systolic function, moderate MR.   Spironolactone was stopped due to elevated K. She apparently did not tolerate Veltassa well.   Echo was done in 11/19.  EF 30-35% with mild LV hypertrophy, mild-moderately decreased RV systolic function, possible severe MR with restricted posterior leaflet, PASP 50 mmHg, severe LAE.  TEE was done in 11/19 to assess mitral regurgitation.  This showed severe MR with restricted posterior leaflet.   In 1/20, she had Mitraclip with reduction in MR to 1+.    She was unable to tolerate Bidil due to lightheadedness.   She returns for followup of CHF.  Weight is up 4 lbs. She feels good overall.  To start cardiac rehab soon.  No dyspnea walking on flat ground.  No orthopnea/PND.  Not very active. No chest pain.   Labs (7/16): K 4.5, creatinine 1.12, BNP 596 Labs (8/16): K 4 => 5.1, creatinine 1.29 => 1.5, BNP 388 => 426 Labs (9/16): K 4, creatinine 1.08, BNP 502 Labs (12/16): K 4.9, creatinine 1.57 Labs (5/17): K 5.2, creatinine 1.3 Labs (8/17): BNP  152 Labs (11/17): K 4.9, creatinine 1.32, BNP 236 Labs (8/18): K 4.6, creatinine 1.57 Labs (11/18): K 3.8, creatinine 1.29 Labs (2/19): K 5.1, creatinine 1.44 Labs (9/19): K 3.8, creatinine 1.08 Labs (11/19): K 4.3, creatinine 1.3 Labs (1/20): K 4.8, creatinine 1.68  PMH: 1. Cardiomyopathy: Nonischemic.  Echo (9/14) with EF 40-45%.  Echo (2/16) with EF 25-30%.  Echo (4/16) with EF 30-35%, moderate MR.  Echo (6/16) with EF 40%, moderate LV dilation, moderate diastolic dysfunction, moderate MR, moderate TR, PA systolic pressure 52 mmHg. RHC/LHC (3/16) with no significant CAD, EF 20-25%, 3+ MR; mean RA 13, PA 70/30 mean 45, mean PCWP 31 mmHg, CI 2.2, PVR 3.1 WU.  Echo (12/16) with EF 45%, diffuse hypokinesis, moderate MR, normal RV size and systolic function.  - Echo (11/17): EF 45%, moderately dilated LV, moderate eccentric posterior MR, PASP 42 mmHg, normal RV size and systolic function.  - Echo (11/18): EF 45%, mild LV dilation, moderate diastolic dysfunction, normal RV size with mildly decreased systolic function, PASP 50 mmHg, moderate MR.  - Echo (11/19): EF 30-35%, mild LV dilation, severe LAE, mild-moderately decreased RV systolic function, possible severe MR with restricted posterior leaflet, PASP 50 mmHg.  - Echo (1/20): EF 30-35%, severe LV dilation, s/p Mitraclip with mild MR and mild MS, PASP 48 mmHg.  - RHC (1/20, with Mitraclip): mean RA 10, mean LA 18 - Echo (2/20): EF 30-35%, s/p mitraclip with mild mitral stenosis, mean MV gradient 5  mmHg, moderate MR, PASP 56 mmHg.  2. PVCs: Holter (10/15) with 12% PVCs. 3. Pulmonary venous hypertension: CTA chest (7/16) negative for PE. PFTs (7/16) with minimal obstruction, mild restriction, moderately decreased DLCO.  4. Thyroid nodules: Biopsy benign.  5. CKD stage 3 6. Mitral regurgitation: TEE (11/19) with EF 30-35%, moderate LV dilation, severe MR with posterior leaflet restriction and ERO 0.41 cm^2, no mitral stenosis, mild to moderately  decreased RV systolic function, peak RV-RA gradient 50 mmHg.  - Mitraclip placement x 2 in 1/20 with 1+ residual MR.  - Echo (2/20) with moderate residual MR, mild MS with mean gradient 5 mmHg.   SH: Lives alone, only child passed away in 02-Jul-2023, nonsmoker, no ETOH or drugs.   FH: Father with MIs  ROS: All systems reviewed and negative except as per HPI.   Current Outpatient Medications  Medication Sig Dispense Refill  . amoxicillin (AMOXIL) 500 MG tablet Take 4 tablets by mouth one hour prior to dental appointments 4 tablet 0  . aspirin 81 MG EC tablet Take 1 tablet (81 mg total) by mouth daily.    . Calcium Carbonate-Vitamin D (CALCIUM 600+D PO) Take 1 tablet by mouth daily.    . carvedilol (COREG) 25 MG tablet Take 1 tablet (25 mg total) by mouth 2 (two) times daily with a meal. 180 tablet 2  . clopidogrel (PLAVIX) 75 MG tablet Take 1 tablet (75 mg total) by mouth daily with breakfast. 90 tablet 2  . furosemide (LASIX) 40 MG tablet Take 1 tablet (40 mg total) by mouth every morning AND 0.5 tablets (20 mg total) every evening. 45 tablet 6  . ibandronate (BONIVA) 150 MG tablet Take 150 mg every 30 (thirty) days by mouth. Take in the morning with a full glass of water, on an empty stomach, and do not take anything else by mouth or lie down for the next 30 min.    . sacubitril-valsartan (ENTRESTO) 49-51 MG Take 1 tablet by mouth 2 (two) times daily. 60 tablet 3   No current facility-administered medications for this encounter.    BP 118/72   Pulse 76   Wt 93.5 kg (206 lb 3.2 oz)   SpO2 98%   BMI 37.71 kg/m    Wt Readings from Last 3 Encounters:  02/08/19 93.5 kg (206 lb 3.2 oz)  01/18/19 91.8 kg (202 lb 6.4 oz)  12/26/18 91.8 kg (202 lb 6 oz)   General: NAD Neck: JVP 8-9 cm, no thyromegaly or thyroid nodule.  Lungs: Clear to auscultation bilaterally with normal respiratory effort. CV: Nondisplaced PMI.  Heart regular S1/S2, no S3/S4, 1/6 HSM apex.  No peripheral edema.  No carotid  bruit.  Normal pedal pulses.  Abdomen: Soft, nontender, no hepatosplenomegaly, no distention.  Skin: Intact without lesions or rashes.  Neurologic: Alert and oriented x 3.  Psych: Normal affect. Extremities: No clubbing or cyanosis.  HEENT: Normal.   Assessment/Plan: 1. Chronic systolic CHF: Nonischemic cardiomyopathy.  EF up to 45% on 12/16, repeat echo 11/18 shows that EF remains about 45%.  Etiology of NICM still unclear. Had frequent PVCs by past holter (12%) but doesn't explain her cardiomyopathy. Had improvement of palpitations on Coreg. Cannot rule out myocarditis. May also have component of Takotsubo with passing of child but EF has not improved as completely as would be expected with Takotsubo. Have previously discussed cardiac MRI to look for infiltrative disease but she says that she would be too claustrophobic.  Echo in 11/19 showed EF down  to 30-35% and mild to moderate RV systolic function.  There also appeared to be severe mitral regurgitation, which seemed progressive from the past. TEE in 11/19 confirmed severe MR with restricted posterior leaflet => Mitraclip 1/20 with decrease in MR to 1+.  Echo in 1/20 showed EF 30-35% with mild MR s/p Mitraclip. Echo in 2/20 with stable EF 30-35%.  NYHA class II-III symptoms, mild volume overload on exam.  - Increase Lasix to 40 mg bid x 3 days then 40 qam/20 qpm.  BMET today and again in 10 days.  - She is off spironolactone with hyperkalemia.   - Continue Coreg 25 mg bid.  - Continue Entresto 49/51 bid.   - She did not tolerate Bidil 1/2 tab tid.  - If K rises again, she did not tolerate Veltassa so would try Lokelma.  - She will start cardiac rehab soon.    - Repeat echo in 7/19.  If EF remains < 35%, will need to consider ICD.  Narrow QRS so not CRT candidate.  2. PVCs: Frequent (12%) on 10/15 holter.   Pt refused repeat holter monitor for further assessment. PVC count was probably not high enough to cause cardiomyopathy, unlikely primary  etiology. - Improved palpitations on Coreg.   3. Pulmonary hypertension: Primarily pulmonary venous HTN (PVR 3.1) from elevated left atrial pressure (previous RHC above).   - Best treatment remains adequate diuresis. 4. Mitral regurgitation:  TEE in 11/19 with severe MR, restricted posterior leaflet.  She had Mitraclip in 1/20. Post-op echo showed mild MR and mild mitral stenosis.  - Will need antibiotics for dental prophylaxis.   Followup 3 wks with NP, see me in 2 months. [name]  02/08/2019

## 2019-02-10 NOTE — Progress Notes (Signed)
Vanessa [name] 68 y.o. female DOB 05/06/1951 MRN [id number]       Nutrition Screen Note  No diagnosis found. Past Medical History:  Diagnosis Date  . Dysrhythmia    PVC's  . Hypertension   . Moderate mitral regurgitation    moderate to severe MR with moderate pulmonary HTN  . Nonischemic cardiomyopathy ([place])    EF 30-35% by echo 2015  . Pulmonary HTN ([place])    moderate with PASP 75mmHg by echo 2015  . PVC's (premature ventricular contractions)    Meds reviewed.    Current Outpatient Medications (Endocrine & Metabolic):  .  ibandronate (BONIVA) 150 MG tablet, Take 150 mg every 30 (thirty) days by mouth. Take in the morning with a full glass of water, on an empty stomach, and do not take anything else by mouth or lie down for the next 30 min.  Current Outpatient Medications (Cardiovascular):  .  carvedilol (COREG) 25 MG tablet, Take 1 tablet (25 mg total) by mouth 2 (two) times daily with a meal. .  furosemide (LASIX) 40 MG tablet, Take 1 tablet (40 mg total) by mouth every morning AND 0.5 tablets (20 mg total) every evening. .  sacubitril-valsartan (ENTRESTO) 49-51 MG, Take 1 tablet by mouth 2 (two) times daily.   Current Outpatient Medications (Analgesics):  .  aspirin 81 MG EC tablet, Take 1 tablet (81 mg total) by mouth daily.  Current Outpatient Medications (Hematological):  .  clopidogrel (PLAVIX) 75 MG tablet, Take 1 tablet (75 mg total) by mouth daily with breakfast.  Current Outpatient Medications (Other):  .  amoxicillin (AMOXIL) 500 MG tablet, Take 4 tablets by mouth one hour prior to dental appointments .  Calcium Carbonate-Vitamin D (CALCIUM 600+D PO), Take 1 tablet by mouth daily.   HT: Ht Readings from Last 1 Encounters:  01/18/19 5\' 2"  (1.575 m)    WT: Wt Readings from Last 5 Encounters:  02/08/19 206 lb 3.2 oz (93.5 kg)  01/18/19 202 lb 6.4 oz (91.8 kg)  12/26/18 202 lb 6 oz (91.8 kg)  12/14/18 200 lb 14.4 oz (91.1 kg)  12/13/18 200 lb 14.4 oz (91.1  kg)     BMI = 37.01   Current tobacco use? No       Labs:  Lipid Panel     Component Value Date/Time   CHOL 193 11/13/2008 2027   TRIG 116 11/13/2008 2027   HDL 55 11/13/2008 2027   CHOLHDL 3.5 Ratio 11/13/2008 2027   VLDL 23 11/13/2008 2027   LDLCALC 115 (H) 11/13/2008 2027    Lab Results  Component Value Date   HGBA1C 6.2 (H) 12/13/2018   CBG (last 3)  No results for input(s): GLUCAP in the last 72 hours.  Nutrition Diagnosis ? Food-and nutrition-related knowledge deficit related to lack of exposure to information as related to diagnosis of: ? CVD ? Pre-diabetes ? Obese  II = 35-39.9 related to excessive energy intake as evidenced by a BMI = 37.01  Nutrition Goal(s):  ? To be determined  Plan:  Pt to attend nutrition classes ? Nutrition I ? Nutrition II ? Portion Distortion  ? Diabetes Blitz ? Diabetes Q & A Will provide client-centered nutrition education as part of interdisciplinary care.   Monitor and evaluate progress toward nutrition goal with team.  Laurina Bustle, MS, RD, LDN 02/10/2019 9:23 AM

## 2019-02-13 ENCOUNTER — Telehealth (HOSPITAL_COMMUNITY): Payer: Self-pay

## 2019-02-13 NOTE — Telephone Encounter (Signed)
Called pt and let her know the department will be closed the next 2 weeks.. pt verbalized understanding.. canceled pt appt until then

## 2019-02-16 ENCOUNTER — Inpatient Hospital Stay (HOSPITAL_COMMUNITY)
Admission: RE | Admit: 2019-02-16 | Discharge: 2019-02-16 | Disposition: A | Discharge status: Home / Self Care | Payer: PPO | Source: Ambulatory Visit

## 2019-02-21 ENCOUNTER — Ambulatory Visit (HOSPITAL_COMMUNITY)
Admission: RE | Admit: 2019-02-21 | Discharge: 2019-02-21 | Disposition: A | Discharge status: Home / Self Care | Payer: PPO | Source: Ambulatory Visit | Attending: Cardiology | Admitting: Cardiology

## 2019-02-21 ENCOUNTER — Other Ambulatory Visit: Payer: Self-pay

## 2019-02-21 DIAGNOSIS — I5022 Chronic systolic (congestive) heart failure: Secondary | ICD-10-CM | POA: Insufficient documentation

## 2019-02-21 LAB — BASIC METABOLIC PANEL
Anion gap: 10 (ref 5–15)
BUN: 33 mg/dL — AB (ref 8–23)
CO2: 20 mmol/L — ABNORMAL LOW (ref 22–32)
CREATININE: 1.53 mg/dL — AB (ref 0.44–1.00)
Calcium: 9.3 mg/dL (ref 8.9–10.3)
Chloride: 108 mmol/L (ref 98–111)
GFR calc Af Amer: 40 mL/min — ABNORMAL LOW (ref >60)
GFR calc non Af Amer: 35 mL/min — ABNORMAL LOW (ref >60)
Glucose, Bld: 113 mg/dL — ABNORMAL HIGH (ref 70–99)
Potassium: 4.4 mmol/L (ref 3.5–5.1)
Sodium: 138 mmol/L (ref 135–145)

## 2019-02-22 ENCOUNTER — Ambulatory Visit (HOSPITAL_COMMUNITY): Payer: PPO

## 2019-02-22 ENCOUNTER — Ambulatory Visit: Payer: PPO

## 2019-02-23 ENCOUNTER — Ambulatory Visit (HOSPITAL_COMMUNITY)
Admission: RE | Admit: 2019-02-23 | Discharge: 2019-02-23 | Disposition: A | Discharge status: Home / Self Care | Payer: PPO | Source: Ambulatory Visit | Attending: Cardiology | Admitting: Cardiology

## 2019-02-23 ENCOUNTER — Other Ambulatory Visit: Payer: Self-pay

## 2019-02-23 DIAGNOSIS — Z9889 Other specified postprocedural states: Secondary | ICD-10-CM

## 2019-02-23 DIAGNOSIS — I493 Ventricular premature depolarization: Secondary | ICD-10-CM

## 2019-02-23 DIAGNOSIS — I5022 Chronic systolic (congestive) heart failure: Secondary | ICD-10-CM | POA: Diagnosis not present

## 2019-02-23 NOTE — Progress Notes (Signed)
Heart Failure TeleHealth Note  Due to national recommendations of social distancing due to [place] 19, telehealth visit is felt to be most appropriate for this patient at this time.  I discussed the limitations, risks, security and privacy concerns of performing an evaluation and management service by telephone and the availability of in person appointments. I also discussed with the patient that there may be a patient responsible charge related to this service. The patient expressed understanding and agreed to proceed.   ID:  Vanessa [name], DOB [birth year], MRN [id number]  Location: Home  Provider location: [address] [place] Type of Visit: Established patient, etc  PCP:  Lennie Odor, PA-C  Cardiologist:  Dr Radford Pax Primary HF: Dr Aundra Dubin  Chief Complaint: chronic systolic HF   History of Present Illness: Vanessa [name] is a 68 y.o. female with a history of chronic systolic HF due to NICM, frequent PVCs, pulmonary HTN (primarily pulmonary venous HTN), and severe MR s/p MitraClip 11/2018.  She presents via Psychiatric nurse for a telehealth visit today. She saw Dr Aundra Dubin on 3/11 and her lasix was increased. She had labs on 3/24, which showed stable creatinine 1.53 and K 4.4. Overall doing well. Fluid came off with the extra lasix. Denies SOB, but is not very active. No edema, orthopnea, or PND. No dizziness or palpitations. Taking all medications. She does not weigh every day. Limits salt and fluid intake. She has been helping out at the daycare, but will stop now due to [employer]. No cough, fever, or chills. No sick contacts or contacts with people who have recently traveled. Cardiac rehab canceled with COVID. She is going to grocery store herself to get food and plans to start going during early senior citizen hours and only when needed. Does not have a way to check her BP.   Weight: down from 202 to 196 lbs on home scale.     She denies symptoms of cough, fevers, chills,  or new SOB worrisome for COVID 19.   Past Medical History:  Diagnosis Date  . Dysrhythmia    PVC's  . Hypertension   . Moderate mitral regurgitation    moderate to severe MR with moderate pulmonary HTN  . Nonischemic cardiomyopathy ([place])    EF 30-35% by echo 2015  . Pulmonary HTN ([place])    moderate with PASP 108mmHg by echo 2015  . PVC's (premature ventricular contractions)    Past Surgical History:  Procedure Laterality Date  . CARDIAC CATHETERIZATION  2003   normal coronary arteries  . CARDIAC CATHETERIZATION  2013  Nov   h/o nonischemic DCM EF 35-40% increase LVEDP   . HYSTEROSCOPY W/D&C  12/02/2012   Procedure: DILATATION AND CURETTAGE /HYSTEROSCOPY;  Surgeon: Maeola Sarah. Landry Mellow, MD;  Location: [facility];  Service: Gynecology;  Laterality: N/A;  cervical polypectomy  . LEFT AND RIGHT HEART CATHETERIZATION WITH CORONARY ANGIOGRAM N/A 02/15/2015   Procedure: LEFT AND RIGHT HEART CATHETERIZATION WITH CORONARY ANGIOGRAM;  Surgeon: Jettie Booze, MD;  Location: [facility] CATH LAB;  Service: Cardiovascular;  Laterality: N/A;  . MITRAL VALVE REPAIR N/A 12/14/2018   Procedure: MITRAL VALVE REPAIR;  Surgeon: Sherren Mocha, MD;  Location: [facility] CV LAB;  Service: Cardiovascular;  Laterality: N/A;  . TEE WITHOUT CARDIOVERSION N/A 10/11/2018   Procedure: TRANSESOPHAGEAL ECHOCARDIOGRAM (TEE);  Surgeon: Larey Dresser, MD;  Location: [facility] ENDOSCOPY;  Service: Cardiovascular;  Laterality: N/A;     Current Outpatient Medications  Medication Sig Dispense Refill  .  aspirin 81 MG EC tablet Take 1 tablet (81 mg total) by mouth daily.    . carvedilol (COREG) 25 MG tablet Take 1 tablet (25 mg total) by mouth 2 (two) times daily with a meal. 180 tablet 2  . clopidogrel (PLAVIX) 75 MG tablet Take 1 tablet (75 mg total) by mouth daily with breakfast. 90 tablet 2  . furosemide (LASIX) 40 MG tablet Take 1 tablet (40 mg total) by mouth every morning AND 0.5 tablets (20 mg total) every evening. 45 tablet 6  .  sacubitril-valsartan (ENTRESTO) 49-51 MG Take 1 tablet by mouth 2 (two) times daily. 60 tablet 3  . amoxicillin (AMOXIL) 500 MG tablet Take 4 tablets by mouth one hour prior to dental appointments 4 tablet 0  . Calcium Carbonate-Vitamin D (CALCIUM 600+D PO) Take 1 tablet by mouth daily.    Marland Kitchen ibandronate (BONIVA) 150 MG tablet Take 150 mg every 30 (thirty) days by mouth. Take in the morning with a full glass of water, on an empty stomach, and do not take anything else by mouth or lie down for the next 30 min.     No current facility-administered medications for this encounter.     Allergies:   Patient has no known allergies.   Social History:  The patient  reports that she has never smoked. She has never used smokeless tobacco. She reports that she does not drink alcohol or use drugs.   Family History:  The patient's family history includes Breast cancer (age of onset: 13) in her sister; Breast cancer (age of onset: 47) in her mother; Heart disease in her father.   ROS:  Please see the history of present illness.   All other systems are personally reviewed and negative.   Exam:  [facility]) Lungs: Normal respiratory effort with conversation.  Neuro: Alert & oriented x 3.   Recent Labs: 05/24/2018: TSH 0.633 12/13/2018: ALT 7; B Natriuretic Peptide 618.4 12/15/2018: Hemoglobin 10.1; Platelets 211 02/21/2019: BUN 33; Creatinine, Ser 1.53; Potassium 4.4; Sodium 138  Personally reviewed   Wt Readings from Last 3 Encounters:  02/08/19 93.5 kg (206 lb 3.2 oz)  01/18/19 91.8 kg (202 lb 6.4 oz)  12/26/18 91.8 kg (202 lb 6 oz)     ASSESSMENT AND PLAN:  1. Chronic systolic CHF: Nonischemic cardiomyopathy.  EF up to 45% on 12/16, repeat echo 11/18 shows that EF remains about 45%.  Etiology of NICM still unclear. Had frequent PVCs by past holter (12%) but doesn't explain her cardiomyopathy. Had improvement of palpitations on Coreg. Cannot rule out myocarditis. May also have component of  Takotsubo with passing of child but EF has not improved as completely as would be expected with Takotsubo. Have previously discussed cardiac MRI to look for infiltrative disease but she says that she would be too claustrophobic.  Echo in 11/19 showed EF down to 30-35% and mild to moderate RV systolic function.  There also appeared to be severe mitral regurgitation, which seemed progressive from the past. TEE in 11/19 confirmed severe MR with restricted posterior leaflet => Mitraclip 1/20 with decrease in MR to 1+.  Echo in 1/20 showed EF 30-35% with mild MR s/p Mitraclip. Echo in 2/20 with stable EF 30-35%.   - NYHA class II symptoms. Volume sounds stable. Improved after extra lasix - Continue lasix 40 qam/20 qpm. Reviewed BMET from earlier this week. Creatinine and K stable.  - She is off spironolactone with hyperkalemia.   - Continue Coreg 25 mg bid.  -  Continue Entresto 49/51 bid.   - She did not tolerate Bidil 1/2 tab tid.  - If K rises again, she did not tolerate Veltassa so would try Lokelma. K 4.4 on 3/24.  - Cardiac Rehab canceled before she could start due to [place].  - Repeat echo in 7/19.  If EF remains < 35%, will need to consider ICD.  Narrow QRS so not CRT candidate.   2. PVCs: Frequent (12%) on 10/15 holter.   Pt refused repeat holter monitor for further assessment. PVC count was probably not high enough to cause cardiomyopathy, unlikely primary etiology. - Denies palpitations.   3. Pulmonary hypertension: Primarily pulmonary venous HTN (PVR 3.1) from elevated left atrial pressure (previous RHC above).   - Best treatment remains adequate diuresis. No change.   4. Mitral regurgitation:  TEE in 11/19 with severe MR, restricted posterior leaflet.  She had Mitraclip in 1/20. Post-op echo showed mild MR and mild mitral stenosis.  - Will need antibiotics for dental prophylaxis. - Finishes plavix in April   5. CKD3 - Creatinine stable 3/24: K 4.4, creatinine 1.53  COVID screen The  patient does not have any symptoms that suggest any further testing/ screening at this time.  Social distancing reinforced today. We discussed what to do if she develops symptoms. I also asked her to let us know if she needs help with getting food in the future.   Relevant cardiac medications were reviewed at length with the patient today.  The patient does not have concerns regarding their medications at this time.   Recommended follow-up: Keep follow up on 6/2 with Dr Aundra Dubin. Encouraged her to call us with any concerns.   The following changes were made today: No medication changes. Pt denies need of any medication refills at this time.   Labs/ tests ordered today include: No labs today. Reviewed labs from 3/24.   Patient Risk: After full review of this patients clinical status, I feel that they are at moderate risk for cardiac decompensation at this time.  Today, I have spent 12 minutes with the patient with telehealth technology discussing symptoms, medications, labs, and covid precautions .    Signed, Georgiana Shore, NP  02/23/2019 9:10 AM  [facility] [address] [facility] [place] [address] [phone] (office) [phone] (fax)

## 2019-02-24 ENCOUNTER — Ambulatory Visit (HOSPITAL_COMMUNITY): Payer: PPO

## 2019-02-27 ENCOUNTER — Ambulatory Visit (HOSPITAL_COMMUNITY): Payer: PPO

## 2019-02-28 ENCOUNTER — Ambulatory Visit: Payer: PPO

## 2019-03-01 ENCOUNTER — Ambulatory Visit (HOSPITAL_COMMUNITY): Payer: PPO

## 2019-03-02 ENCOUNTER — Encounter (HOSPITAL_COMMUNITY): Payer: PPO

## 2019-03-03 ENCOUNTER — Ambulatory Visit (HOSPITAL_COMMUNITY): Payer: PPO

## 2019-03-06 ENCOUNTER — Ambulatory Visit (HOSPITAL_COMMUNITY): Payer: PPO

## 2019-03-08 ENCOUNTER — Ambulatory Visit (HOSPITAL_COMMUNITY): Payer: PPO

## 2019-03-10 ENCOUNTER — Ambulatory Visit (HOSPITAL_COMMUNITY): Payer: PPO

## 2019-03-13 ENCOUNTER — Telehealth (HOSPITAL_COMMUNITY): Payer: Self-pay | Admitting: Cardiology

## 2019-03-13 ENCOUNTER — Ambulatory Visit (HOSPITAL_COMMUNITY): Payer: PPO

## 2019-03-13 NOTE — Telephone Encounter (Signed)
Patient would like to know if she can discontinue plavix. Was told after mitral clip she could discontinue plavix after three months. Mitral clip done 12/14/18

## 2019-03-15 ENCOUNTER — Ambulatory Visit (HOSPITAL_COMMUNITY): Payer: PPO

## 2019-03-17 ENCOUNTER — Ambulatory Visit (HOSPITAL_COMMUNITY): Payer: PPO

## 2019-03-20 ENCOUNTER — Telehealth (HOSPITAL_COMMUNITY): Payer: Self-pay | Admitting: Vascular Surgery

## 2019-03-20 ENCOUNTER — Ambulatory Visit (HOSPITAL_COMMUNITY): Payer: PPO

## 2019-03-20 MED ORDER — SACUBITRIL-VALSARTAN 49-51 MG PO TABS: 1.0000 | ORAL_TABLET | Freq: Two times a day (BID) | ORAL | 3 refills | Status: DC

## 2019-03-20 NOTE — Telephone Encounter (Signed)
As requested 90 day supply of entresto sent to pharmacy on file

## 2019-03-20 NOTE — Telephone Encounter (Signed)
PT  Called needing a refill on Entresto , she would like a 92 day supply.. please advise

## 2019-03-22 ENCOUNTER — Ambulatory Visit (HOSPITAL_COMMUNITY): Payer: PPO

## 2019-03-24 ENCOUNTER — Ambulatory Visit (HOSPITAL_COMMUNITY): Payer: PPO

## 2019-03-27 ENCOUNTER — Ambulatory Visit (HOSPITAL_COMMUNITY): Payer: PPO

## 2019-03-29 ENCOUNTER — Ambulatory Visit (HOSPITAL_COMMUNITY): Payer: PPO

## 2019-03-29 ENCOUNTER — Ambulatory Visit: Payer: PPO

## 2019-03-30 ENCOUNTER — Telehealth (HOSPITAL_COMMUNITY): Payer: Self-pay | Admitting: *Deleted

## 2019-03-30 NOTE — Telephone Encounter (Signed)
Called and spoke to pt in regards to Cardiac Rehab continued closure due to adherence of national recommendation for Covid-19 in group setting. Pt verbalized understanding and wishes to be called to schedule when permitted.  Pt states that she is doing well and is exercising.  Pt denied any needs for additional exercise she can do during the interimin. Pt declined and felt her walking was enough. Will check back in with pt in a couple of weeks to see how she is doing.Cherre Huger, BSN Cardiac and Training and development officer

## 2019-03-31 ENCOUNTER — Ambulatory Visit (HOSPITAL_COMMUNITY): Payer: PPO

## 2019-04-03 ENCOUNTER — Ambulatory Visit (HOSPITAL_COMMUNITY): Payer: PPO

## 2019-04-05 ENCOUNTER — Ambulatory Visit (HOSPITAL_COMMUNITY): Payer: PPO

## 2019-04-07 ENCOUNTER — Ambulatory Visit (HOSPITAL_COMMUNITY): Payer: PPO

## 2019-04-10 ENCOUNTER — Ambulatory Visit (HOSPITAL_COMMUNITY): Payer: PPO

## 2019-04-12 ENCOUNTER — Ambulatory Visit (HOSPITAL_COMMUNITY): Payer: PPO

## 2019-04-14 ENCOUNTER — Ambulatory Visit (HOSPITAL_COMMUNITY): Payer: PPO

## 2019-04-17 ENCOUNTER — Ambulatory Visit (HOSPITAL_COMMUNITY): Payer: PPO

## 2019-04-19 ENCOUNTER — Ambulatory Visit (HOSPITAL_COMMUNITY): Payer: PPO

## 2019-04-21 ENCOUNTER — Ambulatory Visit (HOSPITAL_COMMUNITY): Payer: PPO

## 2019-04-26 ENCOUNTER — Ambulatory Visit (HOSPITAL_COMMUNITY): Payer: PPO

## 2019-04-28 ENCOUNTER — Ambulatory Visit (HOSPITAL_COMMUNITY): Payer: PPO

## 2019-05-01 ENCOUNTER — Ambulatory Visit (HOSPITAL_COMMUNITY): Payer: PPO

## 2019-05-02 ENCOUNTER — Telehealth (HOSPITAL_COMMUNITY): Payer: Self-pay

## 2019-05-02 ENCOUNTER — Encounter (HOSPITAL_COMMUNITY): Payer: PPO | Admitting: Cardiology

## 2019-05-02 NOTE — Telephone Encounter (Signed)
Attempted to contact pt to schedule a telephone visit for our Virtual Cardiac Rehab, [email or web address] °

## 2019-05-03 ENCOUNTER — Ambulatory Visit (HOSPITAL_COMMUNITY): Payer: PPO

## 2019-05-03 ENCOUNTER — Telehealth (HOSPITAL_COMMUNITY): Payer: Self-pay | Admitting: *Deleted

## 2019-05-03 NOTE — Telephone Encounter (Signed)
Pt called about cardiac rehab.  Pt does not have a smart device in order to download the app.  Pt desires to receive information in the mail, follow up phone calls and eventually schedule to participate in the traditional setting.  Will send pt exercises and bands she can use along with heart healthy nutrition guides. Cherre Huger, BSN Cardiac and Training and development officer

## 2019-05-03 NOTE — Telephone Encounter (Signed)
    Pt called and left message for cardiac rehab  on voicemail.  Called and left message for pt to please return call.  Would like to speak to her regarding virtual cardiac rehab.  Call back number provided. Cherre Huger, BSN Cardiac and Training and development officer

## 2019-05-05 ENCOUNTER — Ambulatory Visit (HOSPITAL_COMMUNITY): Payer: PPO

## 2019-05-08 ENCOUNTER — Ambulatory Visit (HOSPITAL_COMMUNITY): Payer: PPO

## 2019-05-08 ENCOUNTER — Ambulatory Visit (HOSPITAL_COMMUNITY)
Admission: RE | Admit: 2019-05-08 | Discharge: 2019-05-08 | Disposition: A | Discharge status: Home / Self Care | Payer: PPO | Source: Ambulatory Visit | Attending: Cardiology | Admitting: Cardiology

## 2019-05-08 ENCOUNTER — Other Ambulatory Visit: Payer: Self-pay

## 2019-05-10 ENCOUNTER — Ambulatory Visit (HOSPITAL_COMMUNITY): Payer: PPO

## 2019-05-12 ENCOUNTER — Ambulatory Visit (HOSPITAL_COMMUNITY): Payer: PPO

## 2019-05-15 ENCOUNTER — Ambulatory Visit (HOSPITAL_COMMUNITY): Payer: PPO

## 2019-05-16 ENCOUNTER — Ambulatory Visit: Payer: PPO

## 2019-05-17 ENCOUNTER — Ambulatory Visit (HOSPITAL_COMMUNITY): Payer: PPO

## 2019-05-19 ENCOUNTER — Ambulatory Visit (HOSPITAL_COMMUNITY): Payer: PPO

## 2019-05-22 ENCOUNTER — Ambulatory Visit (HOSPITAL_COMMUNITY): Payer: PPO

## 2019-05-24 ENCOUNTER — Ambulatory Visit (HOSPITAL_COMMUNITY): Payer: PPO

## 2019-05-26 ENCOUNTER — Ambulatory Visit (HOSPITAL_COMMUNITY): Payer: PPO

## 2019-05-29 ENCOUNTER — Ambulatory Visit (HOSPITAL_COMMUNITY): Payer: PPO

## 2019-06-09 ENCOUNTER — Telehealth (HOSPITAL_COMMUNITY): Payer: Self-pay

## 2019-06-09 NOTE — Telephone Encounter (Signed)
Pt insurance is active and benefits verified through [employer]. Co-pay $15.00, DED $0.00/$0.00 met, out of pocket $3,400.00/$533.46 met, co-insurance 0%. No pre-authorization required. Veronica/[facility], 06/09/2019 @ 921AM, [facility]#[id number]

## 2019-06-28 ENCOUNTER — Other Ambulatory Visit: Payer: Self-pay

## 2019-06-28 ENCOUNTER — Ambulatory Visit
Admission: RE | Admit: 2019-06-28 | Discharge: 2019-06-28 | Disposition: A | Discharge status: Home / Self Care | Payer: PPO | Source: Ambulatory Visit | Attending: Physician Assistant | Admitting: Physician Assistant

## 2019-06-28 DIAGNOSIS — Z1231 Encounter for screening mammogram for malignant neoplasm of breast: Secondary | ICD-10-CM

## 2019-07-28 ENCOUNTER — Encounter (HOSPITAL_COMMUNITY): Payer: Self-pay | Admitting: Cardiology

## 2019-07-28 ENCOUNTER — Other Ambulatory Visit: Payer: Self-pay

## 2019-07-28 ENCOUNTER — Telehealth (HOSPITAL_COMMUNITY): Payer: Self-pay

## 2019-07-28 ENCOUNTER — Ambulatory Visit (HOSPITAL_COMMUNITY)
Admission: RE | Admit: 2019-07-28 | Discharge: 2019-07-28 | Disposition: A | Discharge status: Home / Self Care | Payer: PPO | Source: Ambulatory Visit | Attending: Cardiology | Admitting: Cardiology

## 2019-07-28 VITALS — BP 110/66 | HR 75 | Wt 198.2 lb

## 2019-07-28 DIAGNOSIS — I493 Ventricular premature depolarization: Secondary | ICD-10-CM | POA: Insufficient documentation

## 2019-07-28 DIAGNOSIS — Z79899 Other long term (current) drug therapy: Secondary | ICD-10-CM | POA: Diagnosis not present

## 2019-07-28 DIAGNOSIS — I272 Pulmonary hypertension, unspecified: Secondary | ICD-10-CM | POA: Insufficient documentation

## 2019-07-28 DIAGNOSIS — I340 Nonrheumatic mitral (valve) insufficiency: Secondary | ICD-10-CM | POA: Insufficient documentation

## 2019-07-28 DIAGNOSIS — I130 Hypertensive heart and chronic kidney disease with heart failure and stage 1 through stage 4 chronic kidney disease, or unspecified chronic kidney disease: Secondary | ICD-10-CM | POA: Diagnosis not present

## 2019-07-28 DIAGNOSIS — Z7984 Long term (current) use of oral hypoglycemic drugs: Secondary | ICD-10-CM | POA: Insufficient documentation

## 2019-07-28 DIAGNOSIS — I5022 Chronic systolic (congestive) heart failure: Secondary | ICD-10-CM | POA: Insufficient documentation

## 2019-07-28 DIAGNOSIS — I420 Dilated cardiomyopathy: Secondary | ICD-10-CM | POA: Insufficient documentation

## 2019-07-28 DIAGNOSIS — I429 Cardiomyopathy, unspecified: Secondary | ICD-10-CM | POA: Insufficient documentation

## 2019-07-28 DIAGNOSIS — E875 Hyperkalemia: Secondary | ICD-10-CM | POA: Insufficient documentation

## 2019-07-28 DIAGNOSIS — N183 Chronic kidney disease, stage 3 (moderate): Secondary | ICD-10-CM | POA: Insufficient documentation

## 2019-07-28 DIAGNOSIS — Z7982 Long term (current) use of aspirin: Secondary | ICD-10-CM | POA: Insufficient documentation

## 2019-07-28 LAB — BASIC METABOLIC PANEL
Anion gap: 11 (ref 5–15)
BUN: 53 mg/dL — ABNORMAL HIGH (ref 8–23)
CO2: 22 mmol/L (ref 22–32)
Calcium: 9 mg/dL (ref 8.9–10.3)
Chloride: 106 mmol/L (ref 98–111)
Creatinine, Ser: 1.77 mg/dL — ABNORMAL HIGH (ref 0.44–1.00)
GFR calc Af Amer: 34 mL/min — ABNORMAL LOW (ref >60)
GFR calc non Af Amer: 29 mL/min — ABNORMAL LOW (ref >60)
Glucose, Bld: 162 mg/dL — ABNORMAL HIGH (ref 70–99)
Potassium: 4.3 mmol/L (ref 3.5–5.1)
Sodium: 139 mmol/L (ref 135–145)

## 2019-07-28 MED ORDER — FUROSEMIDE 40 MG PO TABS: 40.0000 mg | ORAL_TABLET | Freq: Every day | ORAL | 6 refills | Status: DC

## 2019-07-28 MED ORDER — DAPAGLIFLOZIN PROPANEDIOL 10 MG PO TABS: 10.0000 mg | ORAL_TABLET | Freq: Every day | ORAL | 6 refills | Status: DC

## 2019-07-28 NOTE — Patient Instructions (Addendum)
START Farxiga 10mg  (1 tab) daily  Labs today and repeat in 2 weeks We will only contact you if something comes back abnormal or we need to make some changes. Otherwise no news is good news!  Your physician has requested that you have an echocardiogram. Echocardiography is a painless test that uses sound waves to create images of your heart. It provides your doctor with information about the size and shape of your heart and how well your heart's chambers and valves are working. This procedure takes approximately one hour. There are no restrictions for this procedure.  Your physician recommends that you schedule a follow-up appointment in: 3 months with Dr Aundra Dubin  At the [facility], you and your health needs are our priority. As part of our continuing mission to provide you with exceptional heart care, we have created designated Provider Care Teams. These Care Teams include your primary Cardiologist (physician) and Advanced Practice Providers (APPs- Physician Assistants and Nurse Practitioners) who all work together to provide you with the care you need, when you need it.   You may see any of the following providers on your designated Care Team at your next follow up: Marland Kitchen Dr Glori Bickers . Dr [name] . Darrick Grinder, NP   Please be sure to bring in all your medications bottles to every appointment.

## 2019-07-28 NOTE — Telephone Encounter (Signed)
Pt aware of results. Will cut lasix down to 40mg  daily. Will rtc on 9/11 for labs

## 2019-07-28 NOTE — Telephone Encounter (Signed)
-----   Message from Larey Dresser, MD sent at 07/28/2019  4:09 PM EDT ----- Decrease Lasix to 40 mg daily (creatinine higher).  BMET in 10 days.

## 2019-07-30 NOTE — Progress Notes (Signed)
Patient ID: Vanessa [name], female   DOB: [birth year], 68 y.o.   MRN: [id number]    Advanced Heart Failure Clinic Note   PCP: Lennie Odor Primary Cardiologist: Dr. Radford Pax HF Cardiology: Dr. Aundra Dubin  68 y.o. with history of nonischemic cardiomyopathy presents for followup of CHF. Patient has had a low EF recognized since at least 9/14, when echo showed EF 40-45%.  In 06/18/23, her child died, which led to significant stress.  She developed worsening exertional dyspnea and subsequent echo showed a fall in EF to 25-30%.  She had left and right heart catheterization in 3/16 with no significant coronary disease detected and moderately elevated right and left heart filling pressures along with pulmonary venous hypertension.  Echo was done 12/16: EF 45%, diffuse hypokinesis. She had a repeat echo 11/17 => EF 45% with moderately dilated LV, moderate MR.  Echo 11/18 showed EF stable at 45% with mild LV dilation and mildly decreased RV systolic function, moderate MR.   Spironolactone was stopped due to elevated K. She apparently did not tolerate Veltassa well.   Echo was done in 11/19.  EF 30-35% with mild LV hypertrophy, mild-moderately decreased RV systolic function, possible severe MR with restricted posterior leaflet, PASP 50 mmHg, severe LAE.  TEE was done in 11/19 to assess mitral regurgitation.  This showed severe MR with restricted posterior leaflet.   In 1/20, she had Mitraclip with reduction in MR to 1+.    She was unable to tolerate Bidil due to lightheadedness.   She returns for followup of CHF.  Weight is down 8 lbs.  No dyspnea walking on flat ground.  No orthopnea/PND.  Feels good overall.   Labs (7/16): K 4.5, creatinine 1.12, BNP 596 Labs (8/16): K 4 => 5.1, creatinine 1.29 => 1.5, BNP 388 => 426 Labs (9/16): K 4, creatinine 1.08, BNP 502 Labs (12/16): K 4.9, creatinine 1.57 Labs (5/17): K 5.2, creatinine 1.3 Labs (8/17): BNP 152 Labs (11/17): K 4.9, creatinine 1.32, BNP 236 Labs  (8/18): K 4.6, creatinine 1.57 Labs (11/18): K 3.8, creatinine 1.29 Labs (2/19): K 5.1, creatinine 1.44 Labs (9/19): K 3.8, creatinine 1.08 Labs (11/19): K 4.3, creatinine 1.3 Labs (1/20): K 4.8, creatinine 1.68 Labs (3/20): K 4.4, creatinine 1.5  PMH: 1. Cardiomyopathy: Nonischemic.  Echo (9/14) with EF 40-45%.  Echo (2/16) with EF 25-30%.  Echo (4/16) with EF 30-35%, moderate MR.  Echo (6/16) with EF 40%, moderate LV dilation, moderate diastolic dysfunction, moderate MR, moderate TR, PA systolic pressure 52 mmHg. RHC/LHC (3/16) with no significant CAD, EF 20-25%, 3+ MR; mean RA 13, PA 70/30 mean 45, mean PCWP 31 mmHg, CI 2.2, PVR 3.1 WU.  Echo (12/16) with EF 45%, diffuse hypokinesis, moderate MR, normal RV size and systolic function.  - Echo (11/17): EF 45%, moderately dilated LV, moderate eccentric posterior MR, PASP 42 mmHg, normal RV size and systolic function.  - Echo (11/18): EF 45%, mild LV dilation, moderate diastolic dysfunction, normal RV size with mildly decreased systolic function, PASP 50 mmHg, moderate MR.  - Echo (11/19): EF 30-35%, mild LV dilation, severe LAE, mild-moderately decreased RV systolic function, possible severe MR with restricted posterior leaflet, PASP 50 mmHg.  - Echo (1/20): EF 30-35%, severe LV dilation, s/p Mitraclip with mild MR and mild MS, PASP 48 mmHg.  - RHC (1/20, with Mitraclip): mean RA 10, mean LA 18 - Echo (2/20): EF 30-35%, s/p mitraclip with mild mitral stenosis, mean MV gradient 5 mmHg, moderate MR, PASP 56 mmHg.  2. PVCs: Holter (10/15) with 12% PVCs. 3. Pulmonary venous hypertension: CTA chest (7/16) negative for PE. PFTs (7/16) with minimal obstruction, mild restriction, moderately decreased DLCO.  4. Thyroid nodules: Biopsy benign.  5. CKD stage 3 6. Mitral regurgitation: TEE (11/19) with EF 30-35%, moderate LV dilation, severe MR with posterior leaflet restriction and ERO 0.41 cm^2, no mitral stenosis, mild to moderately decreased RV systolic  function, peak RV-RA gradient 50 mmHg.  - Mitraclip placement x 2 in 1/20 with 1+ residual MR.  - Echo (2/20) with moderate residual MR, mild MS with mean gradient 5 mmHg.   SH: Lives alone, only child passed away in 07-09-23, nonsmoker, no ETOH or drugs.   FH: Father with MIs  ROS: All systems reviewed and negative except as per HPI.   Current Outpatient Medications  Medication Sig Dispense Refill  . amoxicillin (AMOXIL) 500 MG tablet Take 4 tablets by mouth one hour prior to dental appointments 4 tablet 0  . aspirin 81 MG EC tablet Take 1 tablet (81 mg total) by mouth daily.    . Calcium Carbonate-Vitamin D (CALCIUM 600+D PO) Take 1 tablet by mouth daily.    . carvedilol (COREG) 25 MG tablet Take 1 tablet (25 mg total) by mouth 2 (two) times daily with a meal. 180 tablet 2  . ibandronate (BONIVA) 150 MG tablet Take 150 mg every 30 (thirty) days by mouth. Take in the morning with a full glass of water, on an empty stomach, and do not take anything else by mouth or lie down for the next 30 min.    . sacubitril-valsartan (ENTRESTO) 49-51 MG Take 1 tablet by mouth 2 (two) times daily. 180 tablet 3  . dapagliflozin propanediol (FARXIGA) 10 MG TABS tablet Take 10 mg by mouth daily before breakfast. 30 tablet 6  . furosemide (LASIX) 40 MG tablet Take 1 tablet (40 mg total) by mouth daily. 45 tablet 6   No current facility-administered medications for this encounter.    BP 110/66   Pulse 75   Wt 89.9 kg (198 lb 3.2 oz)   SpO2 98%   BMI 36.25 kg/m    Wt Readings from Last 3 Encounters:  07/28/19 89.9 kg (198 lb 3.2 oz)  02/08/19 93.5 kg (206 lb 3.2 oz)  01/18/19 91.8 kg (202 lb 6.4 oz)   General: NAD Neck: No JVD, no thyromegaly or thyroid nodule.  Lungs: Clear to auscultation bilaterally with normal respiratory effort. CV: Nondisplaced PMI.  Heart regular S1/S2, no S3/S4, no murmur.  No peripheral edema.  No carotid bruit.  Normal pedal pulses.  Abdomen: Soft, nontender, no  hepatosplenomegaly, no distention.  Skin: Intact without lesions or rashes.  Neurologic: Alert and oriented x 3.  Psych: Normal affect. Extremities: No clubbing or cyanosis.  HEENT: Normal.   Assessment/Plan: 1. Chronic systolic CHF: Nonischemic cardiomyopathy.  EF up to 45% on 12/16, repeat echo 11/18 shows that EF remains about 45%.  Etiology of NICM still unclear. Had frequent PVCs by past holter (12%) but doesn't explain her cardiomyopathy. Had improvement of palpitations on Coreg. Cannot rule out myocarditis. May also have component of Takotsubo with passing of child but EF has not improved as completely as would be expected with Takotsubo. Have previously discussed cardiac MRI to look for infiltrative disease but she says that she would be too claustrophobic.  Echo in 11/19 showed EF down to 30-35% and mild to moderate RV systolic function.  There also appeared to be severe mitral regurgitation, which  seemed progressive from the past. TEE in 11/19 confirmed severe MR with restricted posterior leaflet => Mitraclip 1/20 with decrease in MR to 1+.  Echo in 1/20 showed EF 30-35% with mild MR s/p Mitraclip. Echo in 2/20 with stable EF 30-35%.  NYHA class II symptoms, she is not volume overloaded.   - Continue Lasix 40 qam/20 qpm.    - She is off spironolactone with hyperkalemia.   - Continue Coreg 25 mg bid.  - Continue Entresto 49/51 bid.   - She did not tolerate Bidil 1/2 tab tid.  - If K rises again, she did not tolerate Veltassa so would try Lokelma.  - I will start her on dapagliflozin 10 mg daily.  BMET today and again in 10 days.     - I will arrange for repeat echo.  If EF remains < 35%, will need to consider ICD though she says that she is not interested.  Narrow QRS so not CRT candidate.  2. PVCs: Frequent (12%) on 10/15 holter.   Pt refused repeat holter monitor for further assessment. PVC count was probably not high enough to cause cardiomyopathy, unlikely primary etiology. - Improved  palpitations on Coreg.   3. Pulmonary hypertension: Primarily pulmonary venous HTN (PVR 3.1) from elevated left atrial pressure (previous RHC above).   - Best treatment remains adequate diuresis. 4. Mitral regurgitation:  TEE in 11/19 with severe MR, restricted posterior leaflet.  She had Mitraclip in 1/20. Post-op echo showed mild MR and mild mitral stenosis.  - Will need antibiotics for dental prophylaxis.   Followup in 3 months.    [name]  07/30/2019

## 2019-08-01 ENCOUNTER — Other Ambulatory Visit (HOSPITAL_COMMUNITY): Payer: Self-pay | Admitting: Cardiology

## 2019-08-01 MED ORDER — DAPAGLIFLOZIN PROPANEDIOL 10 MG PO TABS: 10.0000 mg | ORAL_TABLET | Freq: Every day | ORAL | 3 refills | Status: DC

## 2019-08-01 NOTE — Telephone Encounter (Signed)
Script printed to accompany patient assistance application

## 2019-08-08 ENCOUNTER — Telehealth (HOSPITAL_COMMUNITY): Payer: Self-pay | Admitting: *Deleted

## 2019-08-08 DIAGNOSIS — D6869 Other thrombophilia: Secondary | ICD-10-CM | POA: Diagnosis not present

## 2019-08-08 DIAGNOSIS — Z0000 Encounter for general adult medical examination without abnormal findings: Secondary | ICD-10-CM | POA: Diagnosis not present

## 2019-08-08 DIAGNOSIS — M8588 Other specified disorders of bone density and structure, other site: Secondary | ICD-10-CM | POA: Diagnosis not present

## 2019-08-08 DIAGNOSIS — I110 Hypertensive heart disease with heart failure: Secondary | ICD-10-CM

## 2019-08-08 DIAGNOSIS — I272 Pulmonary hypertension, unspecified: Secondary | ICD-10-CM | POA: Diagnosis not present

## 2019-08-08 DIAGNOSIS — I509 Heart failure, unspecified: Secondary | ICD-10-CM | POA: Diagnosis not present

## 2019-08-08 DIAGNOSIS — I119 Hypertensive heart disease without heart failure: Secondary | ICD-10-CM | POA: Diagnosis not present

## 2019-08-08 DIAGNOSIS — I429 Cardiomyopathy, unspecified: Secondary | ICD-10-CM | POA: Diagnosis not present

## 2019-08-08 NOTE — Telephone Encounter (Signed)
Received fax from pt's PCP: Lennie Odor, PA, she would like pt to have a lipid panel and microalbumin drawn here on Fri 9/11 when pt comes for her labs.  Orders placed, will forward results to her

## 2019-08-09 ENCOUNTER — Telehealth (HOSPITAL_COMMUNITY): Payer: Self-pay | Admitting: Pharmacy Technician

## 2019-08-09 NOTE — Telephone Encounter (Signed)
Per Dr Aundra Dubin, pt can switch to [place] as [place] not on formulary. LM for patient to call office.

## 2019-08-09 NOTE — Telephone Encounter (Signed)
Received message from clinic regarding co-pay on Farxiga. When I attempted to do a test claim Farxiga 10mg  was not on Formulary and Jardiance 10mg  would be $8.95.   Will let nursing staff know   Charlann Boxer, CPhT

## 2019-08-10 ENCOUNTER — Other Ambulatory Visit: Payer: Self-pay | Admitting: Physician Assistant

## 2019-08-10 DIAGNOSIS — M8580 Other specified disorders of bone density and structure, unspecified site: Secondary | ICD-10-CM

## 2019-08-10 MED ORDER — EMPAGLIFLOZIN 10 MG PO TABS: 10.0000 mg | ORAL_TABLET | Freq: Every day | ORAL | 6 refills | Status: DC

## 2019-08-10 NOTE — Telephone Encounter (Signed)
Spoke with patient, she never picked up farxiga as coupon did not apply to her.  Advised she can start Jardiance 10mg  daily.  Verbalized understanding.

## 2019-08-10 NOTE — Addendum Note (Signed)
Addended by: Valeda Malm on: 08/10/2019 10:28 AM   Modules accepted: Orders

## 2019-08-11 ENCOUNTER — Ambulatory Visit (HOSPITAL_COMMUNITY)
Admission: RE | Admit: 2019-08-11 | Discharge: 2019-08-11 | Disposition: A | Discharge status: Home / Self Care | Payer: PPO | Source: Ambulatory Visit | Attending: Cardiology | Admitting: Cardiology

## 2019-08-11 ENCOUNTER — Other Ambulatory Visit: Payer: Self-pay

## 2019-08-11 DIAGNOSIS — Z0000 Encounter for general adult medical examination without abnormal findings: Secondary | ICD-10-CM | POA: Insufficient documentation

## 2019-08-11 DIAGNOSIS — I420 Dilated cardiomyopathy: Secondary | ICD-10-CM | POA: Diagnosis not present

## 2019-08-11 DIAGNOSIS — I110 Hypertensive heart disease with heart failure: Secondary | ICD-10-CM | POA: Insufficient documentation

## 2019-08-11 LAB — BASIC METABOLIC PANEL
Anion gap: 9 (ref 5–15)
BUN: 35 mg/dL — ABNORMAL HIGH (ref 8–23)
CO2: 22 mmol/L (ref 22–32)
Calcium: 9.2 mg/dL (ref 8.9–10.3)
Chloride: 107 mmol/L (ref 98–111)
Creatinine, Ser: 1.48 mg/dL — ABNORMAL HIGH (ref 0.44–1.00)
GFR calc Af Amer: 42 mL/min — ABNORMAL LOW (ref >60)
GFR calc non Af Amer: 36 mL/min — ABNORMAL LOW (ref >60)
Glucose, Bld: 115 mg/dL — ABNORMAL HIGH (ref 70–99)
Potassium: 4.1 mmol/L (ref 3.5–5.1)
Sodium: 138 mmol/L (ref 135–145)

## 2019-08-11 LAB — LIPID PANEL
Cholesterol: 178 mg/dL (ref 0–200)
HDL: 57 mg/dL (ref >40)
LDL Cholesterol: 109 mg/dL — ABNORMAL HIGH (ref 0–99)
Total CHOL/HDL Ratio: 3.1 RATIO
Triglycerides: 62 mg/dL (ref <150)
VLDL: 12 mg/dL (ref 0–40)

## 2019-08-12 LAB — MICROALBUMIN, URINE: Microalb, Ur: 3.1 ug/mL — ABNORMAL HIGH

## 2019-08-28 ENCOUNTER — Other Ambulatory Visit: Payer: Self-pay

## 2019-08-28 ENCOUNTER — Ambulatory Visit (HOSPITAL_COMMUNITY)
Admission: RE | Admit: 2019-08-28 | Discharge: 2019-08-28 | Disposition: A | Discharge status: Home / Self Care | Payer: PPO | Source: Ambulatory Visit | Attending: Cardiology | Admitting: Cardiology

## 2019-08-28 DIAGNOSIS — I272 Pulmonary hypertension, unspecified: Secondary | ICD-10-CM | POA: Insufficient documentation

## 2019-08-28 DIAGNOSIS — I110 Hypertensive heart disease with heart failure: Secondary | ICD-10-CM | POA: Insufficient documentation

## 2019-08-28 DIAGNOSIS — I420 Dilated cardiomyopathy: Secondary | ICD-10-CM | POA: Insufficient documentation

## 2019-08-28 DIAGNOSIS — I340 Nonrheumatic mitral (valve) insufficiency: Secondary | ICD-10-CM | POA: Diagnosis not present

## 2019-08-28 DIAGNOSIS — I509 Heart failure, unspecified: Secondary | ICD-10-CM | POA: Insufficient documentation

## 2019-08-29 ENCOUNTER — Telehealth (HOSPITAL_COMMUNITY): Payer: Self-pay

## 2019-08-29 NOTE — Telephone Encounter (Signed)
-----   Message from Larey Dresser, MD sent at 08/28/2019 10:19 PM EDT ----- EF 30-35%, s/p Mitraclip with mild-moderate MR and mild MS.  IVC dilated suggesting volume overload.  Needs office appointment to assess volume.

## 2019-08-29 NOTE — Telephone Encounter (Signed)
Pt aware of echo report.  Will arrange visit with office staff Barnett Applebaum after speaking with MD.

## 2019-08-31 ENCOUNTER — Other Ambulatory Visit: Payer: Self-pay

## 2019-08-31 ENCOUNTER — Encounter (HOSPITAL_COMMUNITY): Payer: Self-pay | Admitting: Cardiology

## 2019-08-31 ENCOUNTER — Ambulatory Visit (HOSPITAL_COMMUNITY)
Admission: RE | Admit: 2019-08-31 | Discharge: 2019-08-31 | Disposition: A | Discharge status: Home / Self Care | Payer: PPO | Source: Ambulatory Visit | Attending: Cardiology | Admitting: Cardiology

## 2019-08-31 VITALS — BP 122/68 | HR 67 | Wt 196.4 lb

## 2019-08-31 DIAGNOSIS — Z7982 Long term (current) use of aspirin: Secondary | ICD-10-CM | POA: Diagnosis not present

## 2019-08-31 DIAGNOSIS — I130 Hypertensive heart and chronic kidney disease with heart failure and stage 1 through stage 4 chronic kidney disease, or unspecified chronic kidney disease: Secondary | ICD-10-CM | POA: Diagnosis not present

## 2019-08-31 DIAGNOSIS — I272 Pulmonary hypertension, unspecified: Secondary | ICD-10-CM | POA: Insufficient documentation

## 2019-08-31 DIAGNOSIS — Z8249 Family history of ischemic heart disease and other diseases of the circulatory system: Secondary | ICD-10-CM | POA: Diagnosis not present

## 2019-08-31 DIAGNOSIS — I5022 Chronic systolic (congestive) heart failure: Secondary | ICD-10-CM

## 2019-08-31 DIAGNOSIS — Z7984 Long term (current) use of oral hypoglycemic drugs: Secondary | ICD-10-CM | POA: Insufficient documentation

## 2019-08-31 DIAGNOSIS — I340 Nonrheumatic mitral (valve) insufficiency: Secondary | ICD-10-CM | POA: Diagnosis not present

## 2019-08-31 DIAGNOSIS — I428 Other cardiomyopathies: Secondary | ICD-10-CM | POA: Diagnosis not present

## 2019-08-31 DIAGNOSIS — I493 Ventricular premature depolarization: Secondary | ICD-10-CM | POA: Diagnosis not present

## 2019-08-31 DIAGNOSIS — I420 Dilated cardiomyopathy: Secondary | ICD-10-CM

## 2019-08-31 DIAGNOSIS — N183 Chronic kidney disease, stage 3 unspecified: Secondary | ICD-10-CM | POA: Diagnosis not present

## 2019-08-31 DIAGNOSIS — Z79899 Other long term (current) drug therapy: Secondary | ICD-10-CM | POA: Insufficient documentation

## 2019-08-31 MED ORDER — FUROSEMIDE 40 MG PO TABS: ORAL_TABLET | ORAL | 6 refills | Status: DC

## 2019-08-31 NOTE — Progress Notes (Signed)
Patient ID: Vanessa [name], female   DOB: 12-02-1950, 68 y.o.   MRN: [id number]    Advanced Heart Failure Clinic Note   PCP: Lennie Odor Primary Cardiologist: Dr. Radford Pax HF Cardiology: Dr. Aundra Dubin  68 y.o. with history of nonischemic cardiomyopathy presents for followup of CHF. Patient has had a low EF recognized since at least 9/14, when echo showed EF 40-45%.  In July 14, 2023, her child died, which led to significant stress.  She developed worsening exertional dyspnea and subsequent echo showed a fall in EF to 25-30%.  She had left and right heart catheterization in 3/16 with no significant coronary disease detected and moderately elevated right and left heart filling pressures along with pulmonary venous hypertension.  Echo was done 12/16: EF 45%, diffuse hypokinesis. She had a repeat echo 11/17 => EF 45% with moderately dilated LV, moderate MR.  Echo 11/18 showed EF stable at 45% with mild LV dilation and mildly decreased RV systolic function, moderate MR.   Spironolactone was stopped due to elevated K. She apparently did not tolerate Veltassa well.   Echo was done in 11/19.  EF 30-35% with mild LV hypertrophy, mild-moderately decreased RV systolic function, possible severe MR with restricted posterior leaflet, PASP 50 mmHg, severe LAE.  TEE was done in 11/19 to assess mitral regurgitation.  This showed severe MR with restricted posterior leaflet.   In 1/20, she had Mitraclip with reduction in MR to 1+.    She had echo in 9/20 with EF 30-35%, severe LAE, normal RV, s/p Mitraclip with mild-moderate MR and mild MS.  The IVC was dilated, suggesting volume overload.   She was unable to tolerate Bidil due to lightheadedness.   She returns for followup of CHF.  Weight is down 2 lbs.  She is not short of breath walking around the house or at work (works in a daycare).  No orthopnea/PND.  No chest pain.  No lightheadedness or palpitations.  Lasix was decreased recently due to rise in creatinine.   Labs  (7/16): K 4.5, creatinine 1.12, BNP 596 Labs (8/16): K 4 => 5.1, creatinine 1.29 => 1.5, BNP 388 => 426 Labs (9/16): K 4, creatinine 1.08, BNP 502 Labs (12/16): K 4.9, creatinine 1.57 Labs (5/17): K 5.2, creatinine 1.3 Labs (8/17): BNP 152 Labs (11/17): K 4.9, creatinine 1.32, BNP 236 Labs (8/18): K 4.6, creatinine 1.57 Labs (11/18): K 3.8, creatinine 1.29 Labs (2/19): K 5.1, creatinine 1.44 Labs (9/19): K 3.8, creatinine 1.08 Labs (11/19): K 4.3, creatinine 1.3 Labs (1/20): K 4.8, creatinine 1.68 Labs (3/20): K 4.4, creatinine 1.5 Labs (9/20): K 4.4, creatinine 1.77 => 1.48, LDL 109  PMH: 1. Cardiomyopathy: Nonischemic.  Echo (9/14) with EF 40-45%.  Echo (2/16) with EF 25-30%.  Echo (4/16) with EF 30-35%, moderate MR.  Echo (6/16) with EF 40%, moderate LV dilation, moderate diastolic dysfunction, moderate MR, moderate TR, PA systolic pressure 52 mmHg. RHC/LHC (3/16) with no significant CAD, EF 20-25%, 3+ MR; mean RA 13, PA 70/30 mean 45, mean PCWP 31 mmHg, CI 2.2, PVR 3.1 WU.  Echo (12/16) with EF 45%, diffuse hypokinesis, moderate MR, normal RV size and systolic function.  - Echo (11/17): EF 45%, moderately dilated LV, moderate eccentric posterior MR, PASP 42 mmHg, normal RV size and systolic function.  - Echo (11/18): EF 45%, mild LV dilation, moderate diastolic dysfunction, normal RV size with mildly decreased systolic function, PASP 50 mmHg, moderate MR.  - Echo (11/19): EF 30-35%, mild LV dilation, severe LAE, mild-moderately decreased RV  systolic function, possible severe MR with restricted posterior leaflet, PASP 50 mmHg.  - Echo (1/20): EF 30-35%, severe LV dilation, s/p Mitraclip with mild MR and mild MS, PASP 48 mmHg.  - RHC (1/20, with Mitraclip): mean RA 10, mean LA 18 - Echo (2/20): EF 30-35%, s/p mitraclip with mild mitral stenosis, mean MV gradient 5 mmHg, moderate MR, PASP 56 mmHg.  - Echo (9/20): EF 30-35%, severe LAE, normal RV, s/p Mitraclip with mild-moderate MR and mild  MS.  The IVC was dilated, suggesting volume overload.  2. PVCs: Holter (10/15) with 12% PVCs. 3. Pulmonary venous hypertension: CTA chest (7/16) negative for PE. PFTs (7/16) with minimal obstruction, mild restriction, moderately decreased DLCO.  4. Thyroid nodules: Biopsy benign.  5. CKD stage 3 6. Mitral regurgitation: TEE (11/19) with EF 30-35%, moderate LV dilation, severe MR with posterior leaflet restriction and ERO 0.41 cm^2, no mitral stenosis, mild to moderately decreased RV systolic function, peak RV-RA gradient 50 mmHg.  - Mitraclip placement x 2 in 1/20 with 1+ residual MR.  - Echo (2/20) with moderate residual MR, mild MS with mean gradient 5 mmHg.  - Echo (9/20) with mild-moderate MR, mild MS.   SH: Lives alone, only child passed away in 2023/06/19, nonsmoker, no ETOH or drugs.   FH: Father with MIs  ROS: All systems reviewed and negative except as per HPI.   Current Outpatient Medications  Medication Sig Dispense Refill   amoxicillin (AMOXIL) 500 MG tablet Take 4 tablets by mouth one hour prior to dental appointments 4 tablet 0   aspirin 81 MG EC tablet Take 1 tablet (81 mg total) by mouth daily.     Calcium Carbonate-Vitamin D (CALCIUM 600+D PO) Take 1 tablet by mouth daily.     carvedilol (COREG) 25 MG tablet Take 1 tablet (25 mg total) by mouth 2 (two) times daily with a meal. 180 tablet 2   empagliflozin (JARDIANCE) 10 MG TABS tablet Take 10 mg by mouth daily before breakfast. 30 tablet 6   furosemide (LASIX) 40 MG tablet Take 40mg  in the AM and 20mg  in the PM 45 tablet 6   ibandronate (BONIVA) 150 MG tablet Take 150 mg every 30 (thirty) days by mouth. Take in the morning with a full glass of water, on an empty stomach, and do not take anything else by mouth or lie down for the next 30 min.     sacubitril-valsartan (ENTRESTO) 49-51 MG Take 1 tablet by mouth 2 (two) times daily. 180 tablet 3   No current facility-administered medications for this encounter.    BP  122/68    Pulse 67    Wt 89.1 kg (196 lb 6 oz)    SpO2 98%    BMI 35.92 kg/m    Wt Readings from Last 3 Encounters:  08/31/19 89.1 kg (196 lb 6 oz)  07/28/19 89.9 kg (198 lb 3.2 oz)  02/08/19 93.5 kg (206 lb 3.2 oz)   General: NAD Neck: JVP 8 cm, no thyromegaly or thyroid nodule.  Lungs: Clear to auscultation bilaterally with normal respiratory effort. CV: Nondisplaced PMI.  Heart regular S1/S2, no S3/S4, 2/6 HSM apex.  No peripheral edema.  No carotid bruit.  Normal pedal pulses.  Abdomen: Soft, nontender, no hepatosplenomegaly, no distention.  Skin: Intact without lesions or rashes.  Neurologic: Alert and oriented x 3.  Psych: Normal affect. Extremities: No clubbing or cyanosis.  HEENT: Normal.   Assessment/Plan: 1. Chronic systolic CHF: Nonischemic cardiomyopathy.  EF up to 45%  on 12/16, repeat echo 11/18 shows that EF remains about 45%.  Etiology of NICM still unclear. Had frequent PVCs by past holter (12%) but doesn't explain her cardiomyopathy. Had improvement of palpitations on Coreg. Cannot rule out myocarditis. May also have component of Takotsubo with passing of child but EF has not improved as completely as would be expected with Takotsubo. Have previously discussed cardiac MRI to look for infiltrative disease but she says that she would be too claustrophobic.  Echo in 11/19 showed EF down to 30-35% and mild to moderate RV systolic function.  There also appeared to be severe mitral regurgitation, which seemed progressive from the past. TEE in 11/19 confirmed severe MR with restricted posterior leaflet => Mitraclip 1/20 with decrease in MR to 1+.  Echo in 1/20 showed EF 30-35% with mild MR s/p Mitraclip. Echo in 9/20 with stable EF 30-35% but dilated IVC suggestive of volume overload.  NYHA class II symptoms.  Probably mildly volume overloaded on exam, Lasix recently decreased.   - Increase Lasix back to 40 qam/20 qpm. BMET in 10 days.  - She is off spironolactone with hyperkalemia.     - Continue Coreg 25 mg bid.  - Continue Entresto 49/51 bid.   - She did not tolerate Bidil 1/2 tab tid.  - If K rises again, she did not tolerate Veltassa so would try Lokelma.  - Continue empagliflozin 10 mg daily.      - EF remains < 35%. We discussed ICD placement today, she wants to think about it and discuss again at next appointment.  Narrow QRS so not CRT candidate.  2. PVCs: Frequent (12%) on 10/15 holter.   Pt refused repeat holter monitor for further assessment. PVC count was probably not high enough to cause cardiomyopathy, unlikely primary etiology. - Improved palpitations on Coreg.   3. Pulmonary hypertension: Primarily pulmonary venous HTN (PVR 3.1) from elevated left atrial pressure (previous RHC above).   - Best treatment remains adequate diuresis. 4. Mitral regurgitation:  TEE in 11/19 with severe MR, restricted posterior leaflet.  She had Mitraclip in 1/20. Post-op echo showed mild MR and mild mitral stenosis.  Most recent echo in 9/20 showed mild-moderate MR, mild MS.  - Will need antibiotics for dental prophylaxis.   Followup in December as scheduled.    [name]  08/31/2019

## 2019-08-31 NOTE — Patient Instructions (Signed)
INCREASE Lasix to 40mg  in the AM and 20mg  in the PM.  Labs in 2 weeks.  Keep follow up appointment on 11/19/19.

## 2019-09-13 ENCOUNTER — Other Ambulatory Visit: Payer: Self-pay

## 2019-09-13 ENCOUNTER — Ambulatory Visit (HOSPITAL_COMMUNITY)
Admission: RE | Admit: 2019-09-13 | Discharge: 2019-09-13 | Disposition: A | Discharge status: Home / Self Care | Payer: PPO | Source: Ambulatory Visit | Attending: Internal Medicine | Admitting: Internal Medicine

## 2019-09-13 DIAGNOSIS — I5022 Chronic systolic (congestive) heart failure: Secondary | ICD-10-CM | POA: Insufficient documentation

## 2019-09-13 LAB — BASIC METABOLIC PANEL
Anion gap: 9 (ref 5–15)
BUN: 54 mg/dL — ABNORMAL HIGH (ref 8–23)
CO2: 25 mmol/L (ref 22–32)
Calcium: 8.9 mg/dL (ref 8.9–10.3)
Chloride: 104 mmol/L (ref 98–111)
Creatinine, Ser: 2.16 mg/dL — ABNORMAL HIGH (ref 0.44–1.00)
GFR calc Af Amer: 26 mL/min — ABNORMAL LOW (ref >60)
GFR calc non Af Amer: 23 mL/min — ABNORMAL LOW (ref >60)
Glucose, Bld: 109 mg/dL — ABNORMAL HIGH (ref 70–99)
Potassium: 4.7 mmol/L (ref 3.5–5.1)
Sodium: 138 mmol/L (ref 135–145)

## 2019-09-14 ENCOUNTER — Telehealth (HOSPITAL_COMMUNITY): Payer: Self-pay

## 2019-09-14 DIAGNOSIS — I5022 Chronic systolic (congestive) heart failure: Secondary | ICD-10-CM

## 2019-09-14 NOTE — Telephone Encounter (Signed)
-----   Message from Larey Dresser, MD sent at 09/13/2019 11:15 PM EDT ----- Creatinine higher.  Hold Lasix for 1 day, then decrease to 40 mg daily. BMET 1 week.

## 2019-09-21 ENCOUNTER — Ambulatory Visit (HOSPITAL_COMMUNITY)
Admission: RE | Admit: 2019-09-21 | Discharge: 2019-09-21 | Disposition: A | Discharge status: Home / Self Care | Payer: PPO | Source: Ambulatory Visit | Attending: Internal Medicine | Admitting: Internal Medicine

## 2019-09-21 ENCOUNTER — Other Ambulatory Visit: Payer: Self-pay

## 2019-09-21 DIAGNOSIS — I5022 Chronic systolic (congestive) heart failure: Secondary | ICD-10-CM | POA: Insufficient documentation

## 2019-09-21 LAB — BASIC METABOLIC PANEL
Anion gap: 9 (ref 5–15)
BUN: 35 mg/dL — ABNORMAL HIGH (ref 8–23)
CO2: 23 mmol/L (ref 22–32)
Calcium: 8.8 mg/dL — ABNORMAL LOW (ref 8.9–10.3)
Chloride: 110 mmol/L (ref 98–111)
Creatinine, Ser: 1.66 mg/dL — ABNORMAL HIGH (ref 0.44–1.00)
GFR calc Af Amer: 36 mL/min — ABNORMAL LOW (ref >60)
GFR calc non Af Amer: 31 mL/min — ABNORMAL LOW (ref >60)
Glucose, Bld: 134 mg/dL — ABNORMAL HIGH (ref 70–99)
Potassium: 4.6 mmol/L (ref 3.5–5.1)
Sodium: 142 mmol/L (ref 135–145)

## 2019-09-28 ENCOUNTER — Ambulatory Visit
Admission: RE | Admit: 2019-09-28 | Discharge: 2019-09-28 | Disposition: A | Discharge status: Home / Self Care | Payer: PPO | Source: Ambulatory Visit | Attending: Physician Assistant | Admitting: Physician Assistant

## 2019-09-28 ENCOUNTER — Other Ambulatory Visit: Payer: Self-pay

## 2019-09-28 DIAGNOSIS — M8580 Other specified disorders of bone density and structure, unspecified site: Secondary | ICD-10-CM

## 2019-09-28 DIAGNOSIS — M8589 Other specified disorders of bone density and structure, multiple sites: Secondary | ICD-10-CM | POA: Diagnosis not present

## 2019-09-28 DIAGNOSIS — Z780 Asymptomatic menopausal state: Secondary | ICD-10-CM | POA: Diagnosis not present

## 2019-10-24 ENCOUNTER — Telehealth (HOSPITAL_COMMUNITY): Payer: Self-pay | Admitting: *Deleted

## 2019-10-24 NOTE — Telephone Encounter (Signed)
Left vm requesting pt return my call to change visit to virtual visit due to covid19 precautions

## 2019-11-02 ENCOUNTER — Ambulatory Visit (HOSPITAL_COMMUNITY)
Admission: RE | Admit: 2019-11-02 | Discharge: 2019-11-02 | Disposition: A | Discharge status: Home / Self Care | Payer: PPO | Source: Ambulatory Visit | Attending: Cardiology | Admitting: Cardiology

## 2019-11-02 ENCOUNTER — Other Ambulatory Visit: Payer: Self-pay

## 2019-11-02 DIAGNOSIS — I272 Pulmonary hypertension, unspecified: Secondary | ICD-10-CM | POA: Diagnosis not present

## 2019-11-02 DIAGNOSIS — I5022 Chronic systolic (congestive) heart failure: Secondary | ICD-10-CM

## 2019-11-02 MED ORDER — CARVEDILOL 25 MG PO TABS: 25.0000 mg | ORAL_TABLET | Freq: Two times a day (BID) | ORAL | 2 refills | Status: DC

## 2019-11-02 NOTE — Progress Notes (Signed)
Patient ID: Vanessa [name], female   DOB: Apr 23, 1951, 68 y.o.   MRN: [id number]    Heart Failure TeleHealth Note  Due to national recommendations of social distancing due to [place] 19, Audio/video telehealth visit is felt to be most appropriate for this patient at this time.  See MyChart message from today for patient consent regarding telehealth for [facility].  Date:  11/02/2019   ID:  Vanessa [name], DOB September 17, 1951, MRN [id number]  Location: Home  Provider location: [facility] Type of Visit: Established patient   PCP:  Lennie Odor, PA-C  Cardiologist:  Dr. Aundra Dubin  Chief Complaint: Shortness of breath   History of Present Illness: Vanessa [name] is a 68 y.o. female who presents via audio/video conferencing for a telehealth visit today.     she denies symptoms worrisome for COVID 19.   Patient has history of nonischemic cardiomyopathy presents for followup of CHF. Patient has had a low EF recognized since at least 9/14, when echo showed EF 40-45%.  In July 07, 2023, her child died, which led to significant stress.  She developed worsening exertional dyspnea and subsequent echo showed a fall in EF to 25-30%.  She had left and right heart catheterization in 3/16 with no significant coronary disease detected and moderately elevated right and left heart filling pressures along with pulmonary venous hypertension.  Echo was done 12/16: EF 45%, diffuse hypokinesis. She had a repeat echo 11/17 => EF 45% with moderately dilated LV, moderate MR.  Echo 11/18 showed EF stable at 45% with mild LV dilation and mildly decreased RV systolic function, moderate MR.   Spironolactone was stopped due to elevated K. She apparently did not tolerate Veltassa well.   Echo was done in 11/19.  EF 30-35% with mild LV hypertrophy, mild-moderately decreased RV systolic function, possible severe MR with restricted posterior leaflet, PASP 50 mmHg, severe LAE.  TEE was done in 11/19 to assess mitral  regurgitation.  This showed severe MR with restricted posterior leaflet.   In 1/20, she had Mitraclip with reduction in MR to 1+.    She had echo in 9/20 with EF 30-35%, severe LAE, normal RV, s/p Mitraclip with mild-moderate MR and mild MS.  The IVC was dilated, suggesting volume overload.   She was unable to tolerate Bidil due to lightheadedness.   She is stable symptomatically.  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  Able to do her job without exertional dyspnea.  No problems walking on flat ground.  No chest pain.  No orthopnea/PND.  No lightheadedness.  Weight has been stable.    Labs (7/16): K 4.5, creatinine 1.12, BNP 596 Labs (8/16): K 4 => 5.1, creatinine 1.29 => 1.5, BNP 388 => 426 Labs (9/16): K 4, creatinine 1.08, BNP 502 Labs (12/16): K 4.9, creatinine 1.57 Labs (5/17): K 5.2, creatinine 1.3 Labs (8/17): BNP 152 Labs (11/17): K 4.9, creatinine 1.32, BNP 236 Labs (8/18): K 4.6, creatinine 1.57 Labs (11/18): K 3.8, creatinine 1.29 Labs (2/19): K 5.1, creatinine 1.44 Labs (9/19): K 3.8, creatinine 1.08 Labs (11/19): K 4.3, creatinine 1.3 Labs (1/20): K 4.8, creatinine 1.68 Labs (3/20): K 4.4, creatinine 1.5 Labs (9/20): K 4.4, creatinine 1.77 => 1.48, LDL 109 Labs (10/20): K 4.6, creatinine 1.66  PMH: 1. Cardiomyopathy: Nonischemic.  Echo (9/14) with EF 40-45%.  Echo (2/16) with EF 25-30%.  Echo (4/16) with EF 30-35%, moderate MR.  Echo (6/16) with EF 40%, moderate LV dilation, moderate diastolic dysfunction, moderate MR,  moderate TR, PA systolic pressure 52 mmHg. RHC/LHC (3/16) with no significant CAD, EF 20-25%, 3+ MR; mean RA 13, PA 70/30 mean 45, mean PCWP 31 mmHg, CI 2.2, PVR 3.1 WU.  Echo (12/16) with EF 45%, diffuse hypokinesis, moderate MR, normal RV size and systolic function.  - Echo (11/17): EF 45%, moderately dilated LV, moderate eccentric posterior MR, PASP 42 mmHg, normal RV size and systolic function.  - Echo (11/18): EF 45%, mild LV dilation, moderate  diastolic dysfunction, normal RV size with mildly decreased systolic function, PASP 50 mmHg, moderate MR.  - Echo (11/19): EF 30-35%, mild LV dilation, severe LAE, mild-moderately decreased RV systolic function, possible severe MR with restricted posterior leaflet, PASP 50 mmHg.  - Echo (1/20): EF 30-35%, severe LV dilation, s/p Mitraclip with mild MR and mild MS, PASP 48 mmHg.  - RHC (1/20, with Mitraclip): mean RA 10, mean LA 18 - Echo (2/20): EF 30-35%, s/p mitraclip with mild mitral stenosis, mean MV gradient 5 mmHg, moderate MR, PASP 56 mmHg.  - Echo (9/20): EF 30-35%, severe LAE, normal RV, s/p Mitraclip with mild-moderate MR and mild MS.  The IVC was dilated, suggesting volume overload.  2. PVCs: Holter (10/15) with 12% PVCs. 3. Pulmonary venous hypertension: CTA chest (7/16) negative for PE. PFTs (7/16) with minimal obstruction, mild restriction, moderately decreased DLCO.  4. Thyroid nodules: Biopsy benign.  5. CKD stage 3 6. Mitral regurgitation: TEE (11/19) with EF 30-35%, moderate LV dilation, severe MR with posterior leaflet restriction and ERO 0.41 cm^2, no mitral stenosis, mild to moderately decreased RV systolic function, peak RV-RA gradient 50 mmHg.  - Mitraclip placement x 2 in 1/20 with 1+ residual MR.  - Echo (2/20) with moderate residual MR, mild MS with mean gradient 5 mmHg.  - Echo (9/20) with mild-moderate MR, mild MS.   SH: Lives alone, only child passed away in 07-02-23, nonsmoker, no ETOH or drugs.   FH: Father with MIs  ROS: All systems reviewed and negative except as per HPI.   Current Outpatient Medications  Medication Sig Dispense Refill  . amoxicillin (AMOXIL) 500 MG tablet Take 4 tablets by mouth one hour prior to dental appointments 4 tablet 0  . aspirin 81 MG EC tablet Take 1 tablet (81 mg total) by mouth daily.    . Calcium Carbonate-Vitamin D (CALCIUM 600+D PO) Take 1 tablet by mouth daily.    . carvedilol (COREG) 25 MG tablet Take 1 tablet (25 mg total) by  mouth 2 (two) times daily with a meal. 180 tablet 2  . empagliflozin (JARDIANCE) 10 MG TABS tablet Take 10 mg by mouth daily before breakfast. 30 tablet 6  . furosemide (LASIX) 40 MG tablet Take 40mg  in the AM and 20mg  in the PM 45 tablet 6  . ibandronate (BONIVA) 150 MG tablet Take 150 mg every 30 (thirty) days by mouth. Take in the morning with a full glass of water, on an empty stomach, and do not take anything else by mouth or lie down for the next 30 min.    . sacubitril-valsartan (ENTRESTO) 49-51 MG Take 1 tablet by mouth 2 (two) times daily. 180 tablet 3   No current facility-administered medications for this encounter.    There were no vitals taken for this visit.   Wt Readings from Last 3 Encounters:  08/31/19 89.1 kg (196 lb 6 oz)  07/28/19 89.9 kg (198 lb 3.2 oz)  02/08/19 93.5 kg (206 lb 3.2 oz)   Exam:  (Video/Tele Health  Call; Exam is subjective and or/visual.) General:  Speaks in full sentences. No resp difficulty. Lungs: Normal respiratory effort with conversation.  Abdomen: Non-distended per patient report Extremities: Pt denies edema. Neuro: Alert & oriented x 3.   Assessment/Plan: 1. Chronic systolic CHF: Nonischemic cardiomyopathy.  EF up to 45% on 12/16, repeat echo 11/18 shows that EF remains about 45%.  Etiology of NICM still unclear. Had frequent PVCs by past holter (12%) but doesn't explain her cardiomyopathy. Had improvement of palpitations on Coreg. Cannot rule out myocarditis. May also have component of Takotsubo with passing of child but EF has not improved as completely as would be expected with Tak[place]ubo. Have previously discussed cardiac MRI to look for infiltrative disease but she says that she would be too claustrophobic.  Echo in 11/19 showed EF down to 30-35% and mild to moderate RV systolic function.  There also appeared to be severe mitral regurgitation, which seemed progressive from the past. TEE in 11/19 confirmed severe MR with restricted posterior  leaflet => Mitraclip 1/20 with decrease in MR to 1+.  Echo in 1/20 showed EF 30-35% with mild MR s/p Mitraclip. Echo in 9/20 with stable EF 30-35% but dilated IVC suggestive of volume overload.  NYHA class II symptoms.  Weight stable. - Continue Lasix 40 mg daily.   - She is off spironolactone with hyperkalemia.   - Continue Coreg 25 mg bid.  - Continue Entresto 49/51 bid.   - She did not tolerate Bidil 1/2 tab tid.  - If K rises again, she did not tolerate Veltassa so would try Lokelma.  - Continue empagliflozin 10 mg daily.      - EF remains < 35%. We discussed ICD placement again today; she has decided that she does not want an ICD.  Narrow QRS so not CRT candidate.  2. PVCs: Frequent (12%) on 10/15 holter.   Pt refused repeat holter monitor for further assessment. PVC count was probably not high enough to cause cardiomyopathy, unlikely primary etiology. - Improved palpitations on Coreg.   3. Pulmonary hypertension: Primarily pulmonary venous HTN (PVR 3.1) from elevated left atrial pressure (previous RHC above).   - Best treatment remains adequate diuresis. 4. Mitral regurgitation:  TEE in 11/19 with severe MR, restricted posterior leaflet.  She had Mitraclip in 1/20. Post-op echo showed mild MR and mild mitral stenosis.  Most recent echo in 9/20 showed mild-moderate MR, mild MS.  - Will need antibiotics for dental prophylaxis.   COVID screen The patient does not have any symptoms that suggest any further testing/ screening at this time.  Social distancing reinforced today.  Patient Risk: After full review of this patients clinical status, I feel that they are at moderate risk for cardiac decompensation at this time.  Relevant cardiac medications were reviewed at length with the patient today. The patient does not have concerns regarding their medications at this time.   Recommended follow-up:  3 months  Today, I have spent 16 minutes with the patient with telehealth technology discussing  the above issues .    Signed, [name], MD  11/02/2019  [place] [address] [facility] [place] [address] [phone] (office) [phone] (fax)

## 2019-11-02 NOTE — Patient Instructions (Signed)
Lab work next week (BMET)   Follow up in 3 months.

## 2019-11-02 NOTE — Progress Notes (Signed)
57month refill of coreg sent to pharmacy. Lab appt scheduled. 3 month follow up scheduled. Patient aware and voiced understanding. AVS mailed.

## 2019-11-08 ENCOUNTER — Other Ambulatory Visit: Payer: Self-pay

## 2019-11-08 ENCOUNTER — Ambulatory Visit (HOSPITAL_COMMUNITY)
Admission: RE | Admit: 2019-11-08 | Discharge: 2019-11-08 | Disposition: A | Discharge status: Home / Self Care | Payer: PPO | Source: Ambulatory Visit | Attending: Cardiology | Admitting: Cardiology

## 2019-11-08 DIAGNOSIS — I5022 Chronic systolic (congestive) heart failure: Secondary | ICD-10-CM | POA: Diagnosis not present

## 2019-11-08 DIAGNOSIS — I272 Pulmonary hypertension, unspecified: Secondary | ICD-10-CM

## 2019-11-08 LAB — BASIC METABOLIC PANEL
Anion gap: 9 (ref 5–15)
BUN: 37 mg/dL — ABNORMAL HIGH (ref 8–23)
CO2: 23 mmol/L (ref 22–32)
Calcium: 9.2 mg/dL (ref 8.9–10.3)
Chloride: 107 mmol/L (ref 98–111)
Creatinine, Ser: 1.59 mg/dL — ABNORMAL HIGH (ref 0.44–1.00)
GFR calc Af Amer: 38 mL/min — ABNORMAL LOW (ref >60)
GFR calc non Af Amer: 33 mL/min — ABNORMAL LOW (ref >60)
Glucose, Bld: 143 mg/dL — ABNORMAL HIGH (ref 70–99)
Potassium: 4.7 mmol/L (ref 3.5–5.1)
Sodium: 139 mmol/L (ref 135–145)

## 2019-11-20 ENCOUNTER — Other Ambulatory Visit (HOSPITAL_COMMUNITY): Payer: Self-pay

## 2019-11-20 MED ORDER — EMPAGLIFLOZIN 10 MG PO TABS: 10.0000 mg | ORAL_TABLET | Freq: Every day | ORAL | 6 refills | Status: DC

## 2019-12-06 ENCOUNTER — Telehealth (HOSPITAL_COMMUNITY): Payer: Self-pay | Admitting: Pharmacist

## 2019-12-06 NOTE — Telephone Encounter (Signed)
Patient Advocate Encounter   Received notification from [employer] that prior authorization for Delene Loll is required. However, when submitting PA, the claim returned saying authorization was already on file and DUR codes just needed to be applied. Provided pharmacy with DUR codes. Patient copay is $9.20. Authorization is good until 11/29/2020.  Audry Riles, PharmD, BCPS, BCCP, CPP Heart Failure Clinic Pharmacist [phone]

## 2019-12-11 NOTE — Progress Notes (Signed)
[facility] [place]                                       Cardiology Office Note    Date:  12/15/2019   ID:  Vanessa [name], DOB 1951/06/17, MRN [id number]  PCP:  Vanessa Odor, PA-C  Cardiologist:  Dr. Aundra Dubin   CC: 1 year s/p MitraClip  History of Present Illness:  Vanessa [name] is a 69 y.o. female with a history of morbid obesity, NICM, PVCs, pulm HTN, CKD stage III and severe MR s/p MitraClip (12/14/18) who presents to clinic for follow up.   Patient's cardiac history dates back nearly 10 years ago when she was first evaluated for symptoms of exertional shortness of breath and fatigue.  She states that she was first told that she had a heart murmur by Dr. Dannielle [name]. She was diagnosed with nonischemic cardiomyopathy, hypertension, and mitral regurgitation. She was followed for several years by Dr. Radford [name]. Catheterization performed in 2016 revealed normal coronary arteries. Ejection fraction was estimated 20 to 25%. Transthoracic echocardiogram revealed moderate (3+) mitral regurgitation and moderate pulmonary hypertension.  She was referred to the pulmonary medicine clinic where she was evaluated by Dr. Halford [name] who felt that the patient's pulmonary hypertension was likely related to her underlying heart failure and mitral valve disease.  She was referred to the advanced heart failure clinic and has been followed carefully ever since by Dr. Aundra Dubin.  Most recent follow-up transthoracic echocardiogram performed October 03, 2018 revealed diffuse global left ventricular hypokinesis with ejection fraction estimated 30 to 35% in the setting of severe mitral regurgitation.  There was moderate pulmonary hypertension and severe left atrial enlargement.  Right ventricular size was felt to be normal and there was trivial tricuspid regurgitation but there was mild to moderate right ventricular systolic dysfunction.  TEE was performed October 11, 2018 and confirmed  the presence of severe mitral regurgitation. Left ventricular systolic function was moderate to severely reduced with ejection fraction estimated 30 to 35% in the setting of severe mitral regurgitation.  The patient was referred to the multidisciplinary heart valve clinic and has been evaluated by Dr. Burt [name] and Dr. Roxy [name].   She underwent successful percutaneous mitral valve repair using 2 MitraClip NTR devices, both positioned A2/P2. Post op echo showed MitraClip with mild MR and mild MS (mean gradient 7 mmHg). Arlyce Harman was discontinued given hyperkalemia. She was discharged on aspirin and plavix. I month echo showed EF 30-35%, normally functioning MitraClip with moderate mitral regurgitation with mild mitral stenosis (mean gradient 5 mm Hg).  She had echo in 9/20 with EF 30-35%, severe LAE, normal RV, s/p Mitraclip with mild-moderate MR and mild MS.  The IVC was dilated, suggesting volume overload  Today she presents to clinic for follow up. She is doing quite well. No CP or SOB. No LE edema, orthopnea or PND. No dizziness or syncope. No blood in stool or urine. No palpitations. Weight is stable. 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   Past Medical History:  Diagnosis Date   Dysrhythmia    PVC's   Hypertension    Moderate mitral regurgitation    moderate to severe MR with moderate pulmonary HTN   Nonischemic cardiomyopathy (HCC)    EF 30-35% by echo 2015   Pulmonary HTN ([place])    moderate with PASP 23mmHg by echo 2015  PVC's (premature ventricular contractions)     Past Surgical History:  Procedure Laterality Date   CARDIAC CATHETERIZATION  2003   normal coronary arteries   CARDIAC CATHETERIZATION  2013  Nov   h/o nonischemic DCM EF 35-40% increase LVEDP    HYSTEROSCOPY WITH D & C  12/02/2012   Procedure: DILATATION AND CURETTAGE /HYSTEROSCOPY;  Surgeon: Vanessa Sarah. Landry Mellow, MD;  Location: [facility];  Service: Gynecology;  Laterality: N/A;  cervical polypectomy   LEFT AND RIGHT  HEART CATHETERIZATION WITH CORONARY ANGIOGRAM N/A 02/15/2015   Procedure: LEFT AND RIGHT HEART CATHETERIZATION WITH CORONARY ANGIOGRAM;  Surgeon: Vanessa Booze, MD;  Location: [facility] CATH LAB;  Service: Cardiovascular;  Laterality: N/A;   MITRAL VALVE REPAIR N/A 12/14/2018   Procedure: MITRAL VALVE REPAIR;  Surgeon: Sherren Mocha, MD;  Location: [facility] CV LAB;  Service: Cardiovascular;  Laterality: N/A;   TEE WITHOUT CARDIOVERSION N/A 10/11/2018   Procedure: TRANSESOPHAGEAL ECHOCARDIOGRAM (TEE);  Surgeon: Vanessa Dresser, MD;  Location: [facility] ENDOSCOPY;  Service: Cardiovascular;  Laterality: N/A;    Current Medications: Outpatient Medications Prior to Visit  Medication Sig Dispense Refill   aspirin 81 MG EC tablet Take 1 tablet (81 mg total) by mouth daily.     Calcium Carbonate-Vitamin D (CALCIUM 600+D PO) Take 1 tablet by mouth daily.     carvedilol (COREG) 25 MG tablet Take 1 tablet (25 mg total) by mouth 2 (two) times daily with a meal. 180 tablet 2   empagliflozin (JARDIANCE) 10 MG TABS tablet Take 10 mg by mouth daily before breakfast. 30 tablet 6   furosemide (LASIX) 40 MG tablet Take 40mg  in the AM and 20mg  in the PM 45 tablet 6   ibandronate (BONIVA) 150 MG tablet Take 150 mg every 30 (thirty) days by mouth. Take in the morning with a full glass of water, on an empty stomach, and do not take anything else by mouth or lie down for the next 30 min.     sacubitril-valsartan (ENTRESTO) 49-51 MG Take 1 tablet by mouth 2 (two) times daily. 180 tablet 3   amoxicillin (AMOXIL) 500 MG tablet Take 4 tablets by mouth one hour prior to dental appointments 4 tablet 0   No facility-administered medications prior to visit.     Allergies:   Patient has no known allergies.   Social History   Socioeconomic History   Marital status: Single    Spouse name: Not on file   Number of children: Not on file   Years of education: Not on file   Highest education level: Not on file    Occupational History   Occupation: Retired    Comment: Textile, [place]  Tobacco Use   Smoking status: Never Smoker   Smokeless tobacco: Never Used  Substance and Sexual Activity   Alcohol use: No    Alcohol/week: 0.0 standard drinks   Drug use: No   Sexual activity: Not on file  Other Topics Concern   Not on file  Social History Narrative   Not on file   Social Determinants of Health   Financial Resource Strain:    Difficulty of Paying Living Expenses: Not on file  Food Insecurity:    Worried About Charity fundraiser in the Last Year: Not on file   [employer] of Food in the Last Year: Not on file  Transportation Needs:    Lack of Transportation (Medical): Not on file   Lack of Transportation (Non-Medical): Not on file  Physical Activity:    Days of Exercise per Week: Not on file   Minutes of Exercise per Session: Not on file  Stress:    Feeling of Stress : Not on file  Social Connections:    Frequency of Communication with Friends and Family: Not on file   Frequency of Social Gatherings with Friends and Family: Not on file   Attends Religious Services: Not on file   Active Member of Clubs or Organizations: Not on file   Attends Archivist Meetings: Not on file   Marital Status: Not on file     Family History:  The patient's family history includes Breast cancer (age of onset: 19) in her sister; Breast cancer (age of onset: 63) in her mother; Heart disease in her father.      ROS:   Please see the history of present illness.    ROS All other systems reviewed and are negative.   PHYSICAL EXAM:   VS:  BP 100/60    Pulse 69    Ht 5\' 2"  (1.575 m)    SpO2 98%    BMI 35.92 kg/m    GEN: Well nourished, well developed, in no acute distress HEENT: normal Neck: no JVD or masses Cardiac: RRR; 2/6 holosytolic murmur at apex. No rubs, or gallops,no edema  Respiratory:  clear to auscultation bilaterally, normal work of  breathing GI: soft, nontender, nondistended, + BS MS: no deformity or atrophy Skin: warm and dry, no rash Neuro:  Alert and Oriented x 3, Strength and sensation are intact Psych: euthymic mood, full affect   Wt Readings from Last 3 Encounters:  08/31/19 196 lb 6 oz (89.1 kg)  07/28/19 198 lb 3.2 oz (89.9 kg)  02/08/19 206 lb 3.2 oz (93.5 kg)      Studies/Labs Reviewed:   EKG:  EKG is ordered today.  Sinus with multifocal PVCs HR 69  Recent Labs: 11/08/2019: BUN 37; Creatinine, Ser 1.59; Potassium 4.7; Sodium 139   Lipid Panel    Component Value Date/Time   CHOL 178 08/11/2019 1038   TRIG 62 08/11/2019 1038   HDL 57 08/11/2019 1038   CHOLHDL 3.1 08/11/2019 1038   VLDL 12 08/11/2019 1038   LDLCALC 109 (H) 08/11/2019 1038    Additional studies/ records that were reviewed today include:  Echocardiogram 12/15/18 Study Conclusions - Left ventricle: The cavity size was severely dilated. Wall thickness was normal. Systolic function was moderately to severely reduced. The estimated ejection fraction was in the range of 30% to 35%. Diffuse hypokinesis. Doppler parameters are consistent with high ventricular filling pressure. - Mitral valve: Calcified annulus. The findings are consistent with mild stenosis. There was mild regurgitation. Valve area by pressure half-time: 2.25 cm^2. - Left atrium: The atrium was severely dilated. - Right ventricle: Systolic function was mildly reduced. - Atrial septum: There was an atrial septal aneurysm. - Pulmonary arteries: Systolic pressure was moderately increased. PA peak pressure: 48 mm Hg (S). Impressions: - Moderate to severe global reduction in LV systolic function; severe LVE; elevated LV filling pressure; s/p mitraclip with mild MR and mild MS (mean gradient 7 mmHg); severe LAE; mild RV dysfunction; mild TR with moderate pulmonary hypertension.  _____________   MITRAL VALVE REPAIR 12/14/18   Successful  percutaneous mitral valve repair using 2 MitraClip NTR devices, both positioned A2/P2  Recommendations   Antiplatelet/Anticoag Recommend uninterrupted dual antiplatelet therapy with Aspirin 81mg  daily and Clopidogrel 75mg  daily. At least 3 months without interruption   _____________  Echo 01/18/19 IMPRESSIONS  1. The left ventricle has moderate-severely reduced systolic function, with an ejection fraction of 30-35%. The cavity size was normal. There is mildly increased left ventricular wall thickness. Left ventricular diastolic Doppler parameters are  consistent with impaired relaxation Left ventricular diffuse hypokinesis.  2. The tricuspid valve is normal in structure.  3. The aortic valve is tricuspid no stenosis of the aortic valve.  4. The aortic root and ascending aorta are normal in size and structure.  5. - MitraClip: Status post Mitraclip placement. Mild mitral stenosis with mean gradient 5 mmHg, MVA by PHT 1.54 cm^2. Moderate residual mitral regurgitation.  6. Right atrial size was mildly dilated.  7. Left atrial size was moderately dilated.  8. Normal IVC size. PA systolic pressure 56 mmHg.  _______________  Echo 11/21/20 IMPRESSIONS  1. Left ventricular ejection fraction, by visual estimation, is 40 to 45%. The left ventricle has mild to moderately decreased function. There is no left ventricular hypertrophy.  2. Left ventricular ejection fraction by 3D volume is is 43 %.  3. Left ventricular diastolic function could not be evaluated.  4. The left ventricle demonstrates global hypokinesis.  5. Global right ventricle has normal systolic function.The right ventricular size is normal. No increase in right ventricular wall thickness.  6. Left atrial size was severely dilated.  7. Right atrial size was moderately dilated.  8. The mitral valve is degenerative. Mild to moderate mitral valve regurgitation.  9. Mild mitral stenosis due to MitraClip with mean gradient 5 mm Hg at 66  bpm. 10. There are two MitraClip devices deployed in the A2-P2 area, well-seated and stable. 11. The tricuspid valve is normal in structure. 12. The aortic valve is normal in structure. Aortic valve regurgitation is not visualized. 13. The pulmonic valve was normal in structure. Pulmonic valve regurgitation is trivial. 14. Mildly elevated pulmonary artery systolic pressure.  Mitral Valve: The mitral valve is degenerative in appearance. There is mild thickening of the mitral valve leaflet(s). Mild to moderate mitral valve regurgitation, with posteriorly-directed jet. Mild mitral valve stenosis by observation. MV peak  gradient, 11.4 mmHg. There are two MitraClip devices deployed in the A2-P2 area, well-seated and stable.   ASSESSMENT & PLAN:   Severe MR s/p MitraClip: echo today shows EF 40-45%,mild to moderate mitral valve regurgitation, with posteriorly-directed jet. Mild mitral valve stenosis by observation. MV peak  gradient, 11.4 mmHg. There are two MitraClip devices deployed in the A2-P2 area, well-seated and stable. Patient is doing excellent with NYHA class I symptoms. SBE prophylaxis discussed; I have RX'd amoxicillin. Continue on aspirin alone.   Medication Adjustments/Labs and Tests Ordered: Current medicines are reviewed at length with the patient today.  Concerns regarding medicines are outlined above.  Medication changes, Labs and Tests ordered today are listed in the Patient Instructions below. Patient Instructions  Medication Instructions:  Your provider recommends that you continue on your current medications as directed. Please refer to the Current Medication list given to you today.     Follow-Up: Please keep your follow-up appointments!    Signed, Angelena Form, PA-C  12/15/2019 3:50 PM    [facility] [place], [place], [place]  [address] Phone: [phone]; Fax: [phone]

## 2019-12-14 ENCOUNTER — Ambulatory Visit (HOSPITAL_COMMUNITY): Payer: PPO | Attending: Cardiology

## 2019-12-14 ENCOUNTER — Encounter: Payer: Self-pay | Admitting: Physician Assistant

## 2019-12-14 ENCOUNTER — Ambulatory Visit (INDEPENDENT_AMBULATORY_CARE_PROVIDER_SITE_OTHER): Payer: PPO | Admitting: Physician Assistant

## 2019-12-14 ENCOUNTER — Other Ambulatory Visit: Payer: Self-pay

## 2019-12-14 VITALS — BP 100/60 | HR 69 | Ht 62.0 in

## 2019-12-14 DIAGNOSIS — Z9889 Other specified postprocedural states: Secondary | ICD-10-CM | POA: Diagnosis not present

## 2019-12-14 DIAGNOSIS — I5042 Chronic combined systolic (congestive) and diastolic (congestive) heart failure: Secondary | ICD-10-CM | POA: Diagnosis not present

## 2019-12-14 MED ORDER — AMOXICILLIN 500 MG PO TABS: ORAL_TABLET | ORAL | 11 refills | Status: DC

## 2019-12-14 NOTE — Progress Notes (Signed)
6 Minute Walk Test Results  [name] Date:  12/14/2019   Supplemental O2 during test? NO      Baseline   End  Time   1600    1606 Heartrate  72    69 Dyspnea  none    mild Fatigue  none    mild O2 sat   98%    97% Blood pressure 98/67    110/60   Patient ambulated at a brisk pace for a total distance of 858 feet with 0 stops.  Ambulation was limited primarily due to N/A  Overall the test was tolerated well.

## 2019-12-14 NOTE — Patient Instructions (Signed)
Medication Instructions:  Your provider recommends that you continue on your current medications as directed. Please refer to the Current Medication list given to you today.     Follow-Up: Please keep your follow-up appointments!

## 2020-01-31 ENCOUNTER — Ambulatory Visit (HOSPITAL_COMMUNITY)
Admission: RE | Admit: 2020-01-31 | Discharge: 2020-01-31 | Disposition: A | Discharge status: Home / Self Care | Payer: PPO | Source: Ambulatory Visit | Attending: Cardiology | Admitting: Cardiology

## 2020-01-31 ENCOUNTER — Encounter (HOSPITAL_COMMUNITY): Payer: Self-pay | Admitting: Cardiology

## 2020-01-31 ENCOUNTER — Other Ambulatory Visit: Payer: Self-pay

## 2020-01-31 VITALS — BP 130/80 | HR 63 | Wt 199.2 lb

## 2020-01-31 DIAGNOSIS — I130 Hypertensive heart and chronic kidney disease with heart failure and stage 1 through stage 4 chronic kidney disease, or unspecified chronic kidney disease: Secondary | ICD-10-CM | POA: Diagnosis not present

## 2020-01-31 DIAGNOSIS — I272 Pulmonary hypertension, unspecified: Secondary | ICD-10-CM | POA: Insufficient documentation

## 2020-01-31 DIAGNOSIS — Z79899 Other long term (current) drug therapy: Secondary | ICD-10-CM | POA: Insufficient documentation

## 2020-01-31 DIAGNOSIS — N183 Chronic kidney disease, stage 3 unspecified: Secondary | ICD-10-CM | POA: Insufficient documentation

## 2020-01-31 DIAGNOSIS — Z7984 Long term (current) use of oral hypoglycemic drugs: Secondary | ICD-10-CM | POA: Insufficient documentation

## 2020-01-31 DIAGNOSIS — Z7982 Long term (current) use of aspirin: Secondary | ICD-10-CM | POA: Insufficient documentation

## 2020-01-31 DIAGNOSIS — I5022 Chronic systolic (congestive) heart failure: Secondary | ICD-10-CM | POA: Diagnosis not present

## 2020-01-31 DIAGNOSIS — I428 Other cardiomyopathies: Secondary | ICD-10-CM | POA: Diagnosis not present

## 2020-01-31 DIAGNOSIS — I340 Nonrheumatic mitral (valve) insufficiency: Secondary | ICD-10-CM

## 2020-01-31 LAB — BASIC METABOLIC PANEL
Anion gap: 6 (ref 5–15)
BUN: 32 mg/dL — ABNORMAL HIGH (ref 8–23)
CO2: 27 mmol/L (ref 22–32)
Calcium: 8.5 mg/dL — ABNORMAL LOW (ref 8.9–10.3)
Chloride: 108 mmol/L (ref 98–111)
Creatinine, Ser: 1.73 mg/dL — ABNORMAL HIGH (ref 0.44–1.00)
GFR calc Af Amer: 35 mL/min — ABNORMAL LOW (ref >60)
GFR calc non Af Amer: 30 mL/min — ABNORMAL LOW (ref >60)
Glucose, Bld: 115 mg/dL — ABNORMAL HIGH (ref 70–99)
Potassium: 4.5 mmol/L (ref 3.5–5.1)
Sodium: 141 mmol/L (ref 135–145)

## 2020-01-31 NOTE — Patient Instructions (Signed)
Routine lab work today. Will notify you of abnormal results  Follow up in 3 months  

## 2020-02-01 NOTE — Progress Notes (Signed)
Patient ID: Vanessa [name], female   DOB: 06/07/1951, 69 y.o.   MRN: [id number]     Date:  11/02/2019   ID:  Vanessa [name], DOB 25-May-1951, MRN [id number]   Provider location: [place] [facility] Type of Visit: Established patient   PCP:  Lennie Odor, PA-C  Cardiologist:  Dr. Aundra Dubin   History of Present Illness: Vanessa [name] is a 69 y.o. female who has history of nonischemic cardiomyopathy and presents for followup of CHF. Patient has had a low EF recognized since at least 9/14, when echo showed EF 40-45%.  In 06-20-2023, her child died, which led to significant stress.  She developed worsening exertional dyspnea and subsequent echo showed a fall in EF to 25-30%.  She had left and right heart catheterization in 3/16 with no significant coronary disease detected and moderately elevated right and left heart filling pressures along with pulmonary venous hypertension.  Echo was done 12/16: EF 45%, diffuse hypokinesis. She had a repeat echo 11/17 => EF 45% with moderately dilated LV, moderate MR.  Echo 11/18 showed EF stable at 45% with mild LV dilation and mildly decreased RV systolic function, moderate MR.   Spironolactone was stopped due to elevated K. She apparently did not tolerate Veltassa well.   Echo was done in 11/19.  EF 30-35% with mild LV hypertrophy, mild-moderately decreased RV systolic function, possible severe MR with restricted posterior leaflet, PASP 50 mmHg, severe LAE.  TEE was done in 11/19 to assess mitral regurgitation.  This showed severe MR with restricted posterior leaflet.   In 1/20, she had Mitraclip with reduction in MR to 1+.    She had echo in 9/20 with EF 30-35%, severe LAE, normal RV, s/p Mitraclip with mild-moderate MR and mild MS.  The IVC was dilated, suggesting volume overload.   She was unable to tolerate Bidil due to lightheadedness.   Echo in 1/21 showed EF 40-45%, RV normal, biatrial dilation, mild mitral stenosis mean 5 mmHg, mild-moderate  MR (s/p Mitraclip).   Patient has been stable symptomatically.  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  No dyspnea walking on flat ground.  She does note dyspnea with stairs.  No lightheadedness.  No chest pain. Weight is up about 3 lbs.   ECG (personally reviewed): NSR, nonspecific inferior and lateral TWIs  Labs (7/16): K 4.5, creatinine 1.12, BNP 596 Labs (8/16): K 4 => 5.1, creatinine 1.29 => 1.5, BNP 388 => 426 Labs (9/16): K 4, creatinine 1.08, BNP 502 Labs (12/16): K 4.9, creatinine 1.57 Labs (5/17): K 5.2, creatinine 1.3 Labs (8/17): BNP 152 Labs (11/17): K 4.9, creatinine 1.32, BNP 236 Labs (8/18): K 4.6, creatinine 1.57 Labs (11/18): K 3.8, creatinine 1.29 Labs (2/19): K 5.1, creatinine 1.44 Labs (9/19): K 3.8, creatinine 1.08 Labs (11/19): K 4.3, creatinine 1.3 Labs (1/20): K 4.8, creatinine 1.68 Labs (3/20): K 4.4, creatinine 1.5 Labs (9/20): K 4.4, creatinine 1.77 => 1.48, LDL 109 Labs (10/20): K 4.6, creatinine 1.66 Labs (12/20): K 4.7, creatinine 1.59  PMH: 1. Cardiomyopathy: Nonischemic.  Echo (9/14) with EF 40-45%.  Echo (2/16) with EF 25-30%.  Echo (4/16) with EF 30-35%, moderate MR.  Echo (6/16) with EF 40%, moderate LV dilation, moderate diastolic dysfunction, moderate MR, moderate TR, PA systolic pressure 52 mmHg. RHC/LHC (3/16) with no significant CAD, EF 20-25%, 3+ MR; mean RA 13, PA 70/30 mean 45, mean PCWP 31 mmHg, CI 2.2, PVR 3.1 WU.  Echo (  12/16) with EF 45%, diffuse hypokinesis, moderate MR, normal RV size and systolic function.  - Echo (11/17): EF 45%, moderately dilated LV, moderate eccentric posterior MR, PASP 42 mmHg, normal RV size and systolic function.  - Echo (11/18): EF 45%, mild LV dilation, moderate diastolic dysfunction, normal RV size with mildly decreased systolic function, PASP 50 mmHg, moderate MR.  - Echo (11/19): EF 30-35%, mild LV dilation, severe LAE, mild-moderately decreased RV systolic function, possible  severe MR with restricted posterior leaflet, PASP 50 mmHg.  - Echo (1/20): EF 30-35%, severe LV dilation, s/p Mitraclip with mild MR and mild MS, PASP 48 mmHg.  - RHC (1/20, with Mitraclip): mean RA 10, mean LA 18 - Echo (2/20): EF 30-35%, s/p mitraclip with mild mitral stenosis, mean MV gradient 5 mmHg, moderate MR, PASP 56 mmHg.  - Echo (9/20): EF 30-35%, severe LAE, normal RV, s/p Mitraclip with mild-moderate MR and mild MS.  The IVC was dilated, suggesting volume overload.  - Echo (1/21): EF 40-45%, RV normal, biatrial dilation, mild mitral stenosis mean 5 mmHg, mild-moderate MR (s/p Mitraclip).  2. PVCs: Holter (10/15) with 12% PVCs. 3. Pulmonary venous hypertension: CTA chest (7/16) negative for PE. PFTs (7/16) with minimal obstruction, mild restriction, moderately decreased DLCO.  4. Thyroid nodules: Biopsy benign.  5. CKD stage 3 6. Mitral regurgitation: TEE (11/19) with EF 30-35%, moderate LV dilation, severe MR with posterior leaflet restriction and ERO 0.41 cm^2, no mitral stenosis, mild to moderately decreased RV systolic function, peak RV-RA gradient 50 mmHg.  - Mitraclip placement x 2 in 1/20 with 1+ residual MR.  - Echo (2/20) with moderate residual MR, mild MS with mean gradient 5 mmHg.  - Echo (9/20) with mild-moderate MR, mild MS.  - Echo (1/21) with mild-moderate MR, mild MS s/p Mitraclip  SH: Lives alone, only child passed away in July 14, 2023, nonsmoker, no ETOH or drugs.   FH: Father with MIs  ROS: All systems reviewed and negative except as per HPI.   Current Outpatient Medications  Medication Sig Dispense Refill  . amoxicillin (AMOXIL) 500 MG tablet Take 4 tablets by mouth one hour prior to dental appointments 8 tablet 11  . aspirin 81 MG EC tablet Take 1 tablet (81 mg total) by mouth daily.    . Calcium Carbonate-Vitamin D (CALCIUM 600+D PO) Take 1 tablet by mouth daily.    . carvedilol (COREG) 25 MG tablet Take 1 tablet (25 mg total) by mouth 2 (two) times daily with a  meal. 180 tablet 2  . empagliflozin (JARDIANCE) 10 MG TABS tablet Take 10 mg by mouth daily before breakfast. 30 tablet 6  . furosemide (LASIX) 40 MG tablet Take 40 mg by mouth daily.    Marland Kitchen ibandronate (BONIVA) 150 MG tablet Take 150 mg every 30 (thirty) days by mouth. Take in the morning with a full glass of water, on an empty stomach, and do not take anything else by mouth or lie down for the next 30 min.    . sacubitril-valsartan (ENTRESTO) 49-51 MG Take 1 tablet by mouth 2 (two) times daily. 180 tablet 3   No current facility-administered medications for this encounter.   BP 130/80   Pulse 63   Wt 90.4 kg (199 lb 3.2 oz)   SpO2 99%   BMI 36.43 kg/m    Wt Readings from Last 3 Encounters:  01/31/20 90.4 kg (199 lb 3.2 oz)  08/31/19 89.1 kg (196 lb 6 oz)  07/28/19 89.9 kg (198 lb 3.2 oz)  Exam:   BP 130/80   Pulse 63   Wt 90.4 kg (199 lb 3.2 oz)   SpO2 99%   BMI 36.43 kg/m  General: NAD Neck: JVP 8 cm, no thyromegaly or thyroid nodule.  Lungs: Clear to auscultation bilaterally with normal respiratory effort. CV: Nondisplaced PMI.  Heart regular S1/S2, no S3/S4, 2/6 HSM apex.  No peripheral edema.  No carotid bruit.  Normal pedal pulses.  Abdomen: Soft, nontender, no hepatosplenomegaly, no distention.  Skin: Intact without lesions or rashes.  Neurologic: Alert and oriented x 3.  Psych: Normal affect. Extremities: No clubbing or cyanosis.  HEENT: Normal.   Assessment/Plan: 1. Chronic systolic CHF: Nonischemic cardiomyopathy.  EF up to 45% on 12/16, repeat echo 11/18 shows that EF remains about 45%.  Etiology of NICM still unclear. Had frequent PVCs by past holter (12%) but doesn't explain her cardiomyopathy. Had improvement of palpitations on Coreg. Cannot rule out myocarditis. May also have component of Takotsubo with passing of child but EF has not improved as completely as would be expected with Takotsubo. Have previously discussed cardiac MRI to look for infiltrative disease  but she says that she would be too claustrophobic.  Echo in 11/19 showed EF down to 30-35% and mild to moderate RV systolic function.  There also appeared to be severe mitral regurgitation, which seemed progressive from the past. TEE in 11/19 confirmed severe MR with restricted posterior leaflet => Mitraclip 1/20 with decrease in MR to 1+.  Echo in 1/20 showed EF 30-35% with mild MR s/p Mitraclip. Echo in 9/20 with stable EF 30-35% but dilated IVC suggestive of volume overload.  Echo in 1/21 with EF 40-45%, normal RV.  NYHA class II symptoms.  Weight stable. - Continue Lasix 40 mg daily.  BMET today.  - She is off spironolactone with hyperkalemia.   - Continue Coreg 25 mg bid.  - Continue Entresto 49/51 bid.   - She did not tolerate Bidil 1/2 tab tid.  - If K rises again, she did not tolerate Veltassa so would try Lokelma.  - Continue empagliflozin 10 mg daily.      - EF remains < 35%. We have discussed ICD placement; she has decided that she does not want an ICD.  Narrow QRS so not CRT candidate.  2. PVCs: Frequent (12%) on 10/15 holter.   Pt refused repeat holter monitor for further assessment. PVC count was probably not high enough to cause cardiomyopathy, unlikely primary etiology. - Improved palpitations on Coreg.   3. Pulmonary hypertension: Primarily pulmonary venous HTN (PVR 3.1) from elevated left atrial pressure (previous RHC above).   - Best treatment remains adequate diuresis. 4. Mitral regurgitation:  TEE in 11/19 with severe MR, restricted posterior leaflet.  She had Mitraclip in 1/20. Post-op echo showed mild MR and mild mitral stenosis.  Most recent echo in 1/21 showed mild-moderate MR, mild MS.  - Will need antibiotics for dental prophylaxis.   Signed, [name], MD  02/01/2020  [place] [address] [facility] [place] [address] [phone] (office) [phone] (fax)

## 2020-02-05 ENCOUNTER — Other Ambulatory Visit: Payer: Self-pay

## 2020-02-07 ENCOUNTER — Other Ambulatory Visit (HOSPITAL_COMMUNITY): Payer: Self-pay

## 2020-02-07 MED ORDER — EMPAGLIFLOZIN 10 MG PO TABS: 10.0000 mg | ORAL_TABLET | Freq: Every day | ORAL | 3 refills | Status: DC

## 2020-03-13 ENCOUNTER — Other Ambulatory Visit (HOSPITAL_COMMUNITY): Payer: Self-pay | Admitting: *Deleted

## 2020-03-13 MED ORDER — EMPAGLIFLOZIN 10 MG PO TABS: 10.0000 mg | ORAL_TABLET | Freq: Every day | ORAL | 3 refills | Status: DC

## 2020-03-29 ENCOUNTER — Other Ambulatory Visit (HOSPITAL_COMMUNITY): Payer: Self-pay | Admitting: *Deleted

## 2020-03-29 MED ORDER — ENTRESTO 49-51 MG PO TABS: 1.0000 | ORAL_TABLET | Freq: Two times a day (BID) | ORAL | 3 refills | Status: DC

## 2020-05-07 ENCOUNTER — Other Ambulatory Visit: Payer: Self-pay

## 2020-05-07 ENCOUNTER — Encounter (HOSPITAL_COMMUNITY): Payer: Self-pay | Admitting: Cardiology

## 2020-05-07 ENCOUNTER — Ambulatory Visit (HOSPITAL_COMMUNITY)
Admission: RE | Admit: 2020-05-07 | Discharge: 2020-05-07 | Disposition: A | Discharge status: Home / Self Care | Payer: PPO | Source: Ambulatory Visit | Attending: Cardiology | Admitting: Cardiology

## 2020-05-07 VITALS — BP 126/80 | HR 68 | Wt 199.2 lb

## 2020-05-07 DIAGNOSIS — Z7984 Long term (current) use of oral hypoglycemic drugs: Secondary | ICD-10-CM | POA: Diagnosis not present

## 2020-05-07 DIAGNOSIS — E041 Nontoxic single thyroid nodule: Secondary | ICD-10-CM | POA: Diagnosis not present

## 2020-05-07 DIAGNOSIS — I130 Hypertensive heart and chronic kidney disease with heart failure and stage 1 through stage 4 chronic kidney disease, or unspecified chronic kidney disease: Secondary | ICD-10-CM | POA: Diagnosis not present

## 2020-05-07 DIAGNOSIS — Z79899 Other long term (current) drug therapy: Secondary | ICD-10-CM | POA: Insufficient documentation

## 2020-05-07 DIAGNOSIS — I493 Ventricular premature depolarization: Secondary | ICD-10-CM | POA: Diagnosis not present

## 2020-05-07 DIAGNOSIS — I272 Pulmonary hypertension, unspecified: Secondary | ICD-10-CM | POA: Insufficient documentation

## 2020-05-07 DIAGNOSIS — N183 Chronic kidney disease, stage 3 unspecified: Secondary | ICD-10-CM | POA: Diagnosis not present

## 2020-05-07 DIAGNOSIS — I5022 Chronic systolic (congestive) heart failure: Secondary | ICD-10-CM

## 2020-05-07 DIAGNOSIS — I340 Nonrheumatic mitral (valve) insufficiency: Secondary | ICD-10-CM | POA: Insufficient documentation

## 2020-05-07 DIAGNOSIS — I428 Other cardiomyopathies: Secondary | ICD-10-CM | POA: Diagnosis not present

## 2020-05-07 DIAGNOSIS — Z7982 Long term (current) use of aspirin: Secondary | ICD-10-CM | POA: Insufficient documentation

## 2020-05-07 LAB — BASIC METABOLIC PANEL
Anion gap: 8 (ref 5–15)
BUN: 34 mg/dL — ABNORMAL HIGH (ref 8–23)
CO2: 27 mmol/L (ref 22–32)
Calcium: 9.2 mg/dL (ref 8.9–10.3)
Chloride: 107 mmol/L (ref 98–111)
Creatinine, Ser: 1.7 mg/dL — ABNORMAL HIGH (ref 0.44–1.00)
GFR calc Af Amer: 35 mL/min — ABNORMAL LOW (ref >60)
GFR calc non Af Amer: 30 mL/min — ABNORMAL LOW (ref >60)
Glucose, Bld: 106 mg/dL — ABNORMAL HIGH (ref 70–99)
Potassium: 4.6 mmol/L (ref 3.5–5.1)
Sodium: 142 mmol/L (ref 135–145)

## 2020-05-07 NOTE — Patient Instructions (Signed)
Labs done today. We will contact you only if your labs are abnormal.  No medication changes were made. Please continue all current medications as prescribed.  Your physician recommends that you schedule a follow-up appointment in: 3 months  At the [facility], you and your health needs are our priority. As part of our continuing mission to provide you with exceptional heart care, we have created designated Provider Care Teams. These Care Teams include your primary Cardiologist (physician) and Advanced Practice Providers (APPs- Physician Assistants and Nurse Practitioners) who all work together to provide you with the care you need, when you need it.   You may see any of the following providers on your designated Care Team at your next follow up: Marland Kitchen Dr Glori Bickers . Dr [name] . Darrick Grinder, NP . Lyda Jester, PA . Audry Riles, PharmD   Please be sure to bring in all your medications bottles to every appointment.

## 2020-05-07 NOTE — Progress Notes (Signed)
Patient ID: Vanessa [name], female   DOB: January 03, 1951, 69 y.o.   MRN: [id number]     ID:  Vanessa [name], DOB 1951/06/09, MRN [id number]   Provider location: [facility] Type of Visit: Established patient   PCP:  Lennie Odor, PA-C  Cardiologist:  Dr. Aundra Dubin   History of Present Illness: Vanessa [name] is a 69 y.o. female who has history of nonischemic cardiomyopathy and presents for followup of CHF. Patient has had a low EF recognized since at least 9/14, when echo showed EF 40-45%.  In 06/17/2023, her child died, which led to significant stress.  She developed worsening exertional dyspnea and subsequent echo showed a fall in EF to 25-30%.  She had left and right heart catheterization in 3/16 with no significant coronary disease detected and moderately elevated right and left heart filling pressures along with pulmonary venous hypertension.  Echo was done 12/16: EF 45%, diffuse hypokinesis. She had a repeat echo 11/17 => EF 45% with moderately dilated LV, moderate MR.  Echo 11/18 showed EF stable at 45% with mild LV dilation and mildly decreased RV systolic function, moderate MR.   Spironolactone was stopped due to elevated K. She apparently did not tolerate Veltassa well.   Echo was done in 11/19.  EF 30-35% with mild LV hypertrophy, mild-moderately decreased RV systolic function, possible severe MR with restricted posterior leaflet, PASP 50 mmHg, severe LAE.  TEE was done in 11/19 to assess mitral regurgitation.  This showed severe MR with restricted posterior leaflet.   In 1/20, she had Mitraclip with reduction in MR to 1+.    She had echo in 9/20 with EF 30-35%, severe LAE, normal RV, s/p Mitraclip with mild-moderate MR and mild MS.  The IVC was dilated, suggesting volume overload.   She was unable to tolerate Bidil due to lightheadedness.   Echo in 1/21 showed EF 40-45%, RV normal, biatrial dilation, mild mitral stenosis mean 5 mmHg, mild-moderate MR (s/p Mitraclip).     She is stable symptomatically.  No dyspnea walking around her house or doing housework. She is short of breath with "a long walk."  No orthopnea/PND.  No chest pain. No lightheadedness. Weight stable.   ECG (personally reviewed): NSR, inferolateral TWIs  Labs (7/16): K 4.5, creatinine 1.12, BNP 596 Labs (8/16): K 4 => 5.1, creatinine 1.29 => 1.5, BNP 388 => 426 Labs (9/16): K 4, creatinine 1.08, BNP 502 Labs (12/16): K 4.9, creatinine 1.57 Labs (5/17): K 5.2, creatinine 1.3 Labs (8/17): BNP 152 Labs (11/17): K 4.9, creatinine 1.32, BNP 236 Labs (8/18): K 4.6, creatinine 1.57 Labs (11/18): K 3.8, creatinine 1.29 Labs (2/19): K 5.1, creatinine 1.44 Labs (9/19): K 3.8, creatinine 1.08 Labs (11/19): K 4.3, creatinine 1.3 Labs (1/20): K 4.8, creatinine 1.68 Labs (3/20): K 4.4, creatinine 1.5 Labs (9/20): K 4.4, creatinine 1.77 => 1.48, LDL 109 Labs (10/20): K 4.6, creatinine 1.66 Labs (12/20): K 4.7, creatinine 1.59 Labs (3/21): K 4.5, creatinine 1.73  PMH: 1. Cardiomyopathy: Nonischemic.  Echo (9/14) with EF 40-45%.  Echo (2/16) with EF 25-30%.  Echo (4/16) with EF 30-35%, moderate MR.  Echo (6/16) with EF 40%, moderate LV dilation, moderate diastolic dysfunction, moderate MR, moderate TR, PA systolic pressure 52 mmHg. RHC/LHC (3/16) with no significant CAD, EF 20-25%, 3+ MR; mean RA 13, PA 70/30 mean 45, mean PCWP 31 mmHg, CI 2.2, PVR 3.1 WU.  Echo (12/16) with EF 45%, diffuse hypokinesis, moderate MR, normal RV size and systolic function.  -  Echo (11/17): EF 45%, moderately dilated LV, moderate eccentric posterior MR, PASP 42 mmHg, normal RV size and systolic function.  - Echo (11/18): EF 45%, mild LV dilation, moderate diastolic dysfunction, normal RV size with mildly decreased systolic function, PASP 50 mmHg, moderate MR.  - Echo (11/19): EF 30-35%, mild LV dilation, severe LAE, mild-moderately decreased RV systolic function, possible severe MR with restricted posterior leaflet, PASP  50 mmHg.  - Echo (1/20): EF 30-35%, severe LV dilation, s/p Mitraclip with mild MR and mild MS, PASP 48 mmHg.  - RHC (1/20, with Mitraclip): mean RA 10, mean LA 18 - Echo (2/20): EF 30-35%, s/p mitraclip with mild mitral stenosis, mean MV gradient 5 mmHg, moderate MR, PASP 56 mmHg.  - Echo (9/20): EF 30-35%, severe LAE, normal RV, s/p Mitraclip with mild-moderate MR and mild MS.  The IVC was dilated, suggesting volume overload.  - Echo (1/21): EF 40-45%, RV normal, biatrial dilation, mild mitral stenosis mean 5 mmHg, mild-moderate MR (s/p Mitraclip).  2. PVCs: Holter (10/15) with 12% PVCs. 3. Pulmonary venous hypertension: CTA chest (7/16) negative for PE. PFTs (7/16) with minimal obstruction, mild restriction, moderately decreased DLCO.  4. Thyroid nodules: Biopsy benign.  5. CKD stage 3 6. Mitral regurgitation: TEE (11/19) with EF 30-35%, moderate LV dilation, severe MR with posterior leaflet restriction and ERO 0.41 cm^2, no mitral stenosis, mild to moderately decreased RV systolic function, peak RV-RA gradient 50 mmHg.  - Mitraclip placement x 2 in 1/20 with 1+ residual MR.  - Echo (2/20) with moderate residual MR, mild MS with mean gradient 5 mmHg.  - Echo (9/20) with mild-moderate MR, mild MS.  - Echo (1/21) with mild-moderate MR, mild MS s/p Mitraclip  SH: Lives alone, only child passed away in 06-18-2023, nonsmoker, no ETOH or drugs.   FH: Father with MIs  ROS: All systems reviewed and negative except as per HPI.   Current Outpatient Medications  Medication Sig Dispense Refill  . amoxicillin (AMOXIL) 500 MG tablet Take 4 tablets by mouth one hour prior to dental appointments 8 tablet 11  . aspirin 81 MG EC tablet Take 1 tablet (81 mg total) by mouth daily.    . Calcium Carbonate-Vitamin D (CALCIUM 600+D PO) Take 1 tablet by mouth daily.    . carvedilol (COREG) 25 MG tablet Take 1 tablet (25 mg total) by mouth 2 (two) times daily with a meal. 180 tablet 2  . empagliflozin (JARDIANCE) 10  MG TABS tablet Take 10 mg by mouth daily before breakfast. 90 tablet 3  . furosemide (LASIX) 40 MG tablet Take 40 mg by mouth daily.    Marland Kitchen ibandronate (BONIVA) 150 MG tablet Take 150 mg every 30 (thirty) days by mouth. Take in the morning with a full glass of water, on an empty stomach, and do not take anything else by mouth or lie down for the next 30 min.    . sacubitril-valsartan (ENTRESTO) 49-51 MG Take 1 tablet by mouth 2 (two) times daily. 180 tablet 3   No current facility-administered medications for this encounter.   BP 126/80   Pulse 68   Wt 90.4 kg (199 lb 3.2 oz)   SpO2 98%   BMI 36.43 kg/m    Wt Readings from Last 3 Encounters:  05/07/20 90.4 kg (199 lb 3.2 oz)  01/31/20 90.4 kg (199 lb 3.2 oz)  08/31/19 89.1 kg (196 lb 6 oz)   Exam:   BP 126/80   Pulse 68   Wt 90.4 kg (  199 lb 3.2 oz)   SpO2 98%   BMI 36.43 kg/m  General: NAD Neck: No JVD, no thyromegaly or thyroid nodule.  Lungs: Clear to auscultation bilaterally with normal respiratory effort. CV: Nondisplaced PMI.  Heart regular S1/S2, no S3/S4, 2/6 HSM apex.  No peripheral edema.  No carotid bruit.  Normal pedal pulses.  Abdomen: Soft, nontender, no hepatosplenomegaly, no distention.  Skin: Intact without lesions or rashes.  Neurologic: Alert and oriented x 3.  Psych: Normal affect. Extremities: No clubbing or cyanosis.  HEENT: Normal.   Assessment/Plan: 1. Chronic systolic CHF: Nonischemic cardiomyopathy.  EF up to 45% on 12/16, repeat echo 11/18 shows that EF remains about 45%.  Etiology of NICM still unclear. Had frequent PVCs by past holter (12%) but doesn't explain her cardiomyopathy. Had improvement of palpitations on Coreg. Cannot rule out myocarditis. May also have component of Takotsubo with passing of child but EF has not improved as completely as would be expected with Takotsubo. Have previously discussed cardiac MRI to look for infiltrative disease but she says that she would be too claustrophobic.   Echo in 11/19 showed EF down to 30-35% and mild to moderate RV systolic function.  There also appeared to be severe mitral regurgitation, which seemed progressive from the past. TEE in 11/19 confirmed severe MR with restricted posterior leaflet => Mitraclip 1/20 with decrease in MR to 1+.  Echo in 1/20 showed EF 30-35% with mild MR s/p Mitraclip. Echo in 9/20 with stable EF 30-35% but dilated IVC suggestive of volume overload.  Echo in 1/21 with EF 40-45%, normal RV.  NYHA class II symptoms.  Weight stable. - Continue Lasix 40 mg daily.  BMET today.  - She is off spironolactone with hyperkalemia.   - Continue Coreg 25 mg bid.  - Continue Entresto 49/51 bid.   - She did not tolerate Bidil 1/2 tab tid.  - If K rises again, she did not tolerate Veltassa so would try Lokelma.  - Continue empagliflozin 10 mg daily.      - EF now above ICD range.   2. PVCs: Frequent (12%) on 10/15 holter.   Pt refused repeat holter monitor for further assessment. PVC count was probably not high enough to cause cardiomyopathy, unlikely primary etiology. - Improved palpitations on Coreg.   3. Pulmonary hypertension: Primarily pulmonary venous HTN (PVR 3.1) from elevated left atrial pressure (previous RHC above).   - Best treatment remains adequate diuresis. 4. Mitral regurgitation:  TEE in 11/19 with severe MR, restricted posterior leaflet.  She had Mitraclip in 1/20. Post-op echo showed mild MR and mild mitral stenosis.  Most recent echo in 1/21 showed mild-moderate MR, mild MS.  - Will need antibiotics for dental prophylaxis.   Followup in 3 months.   Signed, [name], MD  05/07/2020  [facility] [address] [facility] [place] [id number] [phone] (office) [phone] (fax)

## 2020-05-20 ENCOUNTER — Other Ambulatory Visit: Payer: Self-pay | Admitting: Physician Assistant

## 2020-05-20 DIAGNOSIS — Z1231 Encounter for screening mammogram for malignant neoplasm of breast: Secondary | ICD-10-CM

## 2020-06-13 ENCOUNTER — Other Ambulatory Visit (HOSPITAL_COMMUNITY): Payer: Self-pay

## 2020-06-13 MED ORDER — FUROSEMIDE 40 MG PO TABS: 40.0000 mg | ORAL_TABLET | Freq: Every day | ORAL | 3 refills | Status: DC

## 2020-06-13 NOTE — Telephone Encounter (Signed)
Patient left a call on triage vm requesting a refill for Lasix, Rx refill sent in, pt made aware

## 2020-07-01 ENCOUNTER — Other Ambulatory Visit: Payer: Self-pay

## 2020-07-01 ENCOUNTER — Ambulatory Visit
Admission: RE | Admit: 2020-07-01 | Discharge: 2020-07-01 | Disposition: A | Discharge status: Home / Self Care | Payer: PPO | Source: Ambulatory Visit | Attending: Physician Assistant | Admitting: Physician Assistant

## 2020-07-01 DIAGNOSIS — Z1231 Encounter for screening mammogram for malignant neoplasm of breast: Secondary | ICD-10-CM | POA: Diagnosis not present

## 2020-08-08 DIAGNOSIS — D6869 Other thrombophilia: Secondary | ICD-10-CM | POA: Diagnosis not present

## 2020-08-08 DIAGNOSIS — N1832 Chronic kidney disease, stage 3b: Secondary | ICD-10-CM | POA: Diagnosis not present

## 2020-08-08 DIAGNOSIS — I272 Pulmonary hypertension, unspecified: Secondary | ICD-10-CM | POA: Diagnosis not present

## 2020-08-08 DIAGNOSIS — I429 Cardiomyopathy, unspecified: Secondary | ICD-10-CM | POA: Diagnosis not present

## 2020-08-08 DIAGNOSIS — I340 Nonrheumatic mitral (valve) insufficiency: Secondary | ICD-10-CM | POA: Diagnosis not present

## 2020-08-08 DIAGNOSIS — Z23 Encounter for immunization: Secondary | ICD-10-CM | POA: Diagnosis not present

## 2020-08-08 DIAGNOSIS — I509 Heart failure, unspecified: Secondary | ICD-10-CM | POA: Diagnosis not present

## 2020-08-08 DIAGNOSIS — R7303 Prediabetes: Secondary | ICD-10-CM | POA: Diagnosis not present

## 2020-08-08 DIAGNOSIS — I119 Hypertensive heart disease without heart failure: Secondary | ICD-10-CM | POA: Diagnosis not present

## 2020-08-08 DIAGNOSIS — Z0000 Encounter for general adult medical examination without abnormal findings: Secondary | ICD-10-CM | POA: Diagnosis not present

## 2020-08-08 DIAGNOSIS — E041 Nontoxic single thyroid nodule: Secondary | ICD-10-CM | POA: Diagnosis not present

## 2020-08-19 ENCOUNTER — Ambulatory Visit (HOSPITAL_COMMUNITY)
Admission: RE | Admit: 2020-08-19 | Discharge: 2020-08-19 | Disposition: A | Discharge status: Home / Self Care | Payer: PPO | Source: Ambulatory Visit | Attending: Cardiology | Admitting: Cardiology

## 2020-08-19 ENCOUNTER — Other Ambulatory Visit: Payer: Self-pay

## 2020-08-19 VITALS — BP 125/80 | HR 64 | Wt 193.6 lb

## 2020-08-19 DIAGNOSIS — I052 Rheumatic mitral stenosis with insufficiency: Secondary | ICD-10-CM | POA: Diagnosis not present

## 2020-08-19 DIAGNOSIS — Z9889 Other specified postprocedural states: Secondary | ICD-10-CM

## 2020-08-19 DIAGNOSIS — Z79899 Other long term (current) drug therapy: Secondary | ICD-10-CM | POA: Insufficient documentation

## 2020-08-19 DIAGNOSIS — N183 Chronic kidney disease, stage 3 unspecified: Secondary | ICD-10-CM | POA: Diagnosis not present

## 2020-08-19 DIAGNOSIS — I493 Ventricular premature depolarization: Secondary | ICD-10-CM | POA: Insufficient documentation

## 2020-08-19 DIAGNOSIS — Z1211 Encounter for screening for malignant neoplasm of colon: Secondary | ICD-10-CM | POA: Diagnosis not present

## 2020-08-19 DIAGNOSIS — I340 Nonrheumatic mitral (valve) insufficiency: Secondary | ICD-10-CM | POA: Diagnosis not present

## 2020-08-19 DIAGNOSIS — I5022 Chronic systolic (congestive) heart failure: Secondary | ICD-10-CM | POA: Diagnosis not present

## 2020-08-19 DIAGNOSIS — Z7982 Long term (current) use of aspirin: Secondary | ICD-10-CM | POA: Diagnosis not present

## 2020-08-19 DIAGNOSIS — I130 Hypertensive heart and chronic kidney disease with heart failure and stage 1 through stage 4 chronic kidney disease, or unspecified chronic kidney disease: Secondary | ICD-10-CM | POA: Diagnosis not present

## 2020-08-19 DIAGNOSIS — E049 Nontoxic goiter, unspecified: Secondary | ICD-10-CM | POA: Insufficient documentation

## 2020-08-19 DIAGNOSIS — I272 Pulmonary hypertension, unspecified: Secondary | ICD-10-CM | POA: Diagnosis not present

## 2020-08-19 DIAGNOSIS — Z7984 Long term (current) use of oral hypoglycemic drugs: Secondary | ICD-10-CM | POA: Diagnosis not present

## 2020-08-19 DIAGNOSIS — I428 Other cardiomyopathies: Secondary | ICD-10-CM | POA: Insufficient documentation

## 2020-08-19 DIAGNOSIS — Z95818 Presence of other cardiac implants and grafts: Secondary | ICD-10-CM | POA: Diagnosis not present

## 2020-08-19 LAB — TSH: TSH: 0.305 u[IU]/mL — ABNORMAL LOW (ref 0.350–4.500)

## 2020-08-19 LAB — BASIC METABOLIC PANEL
Anion gap: 7 (ref 5–15)
BUN: 27 mg/dL — ABNORMAL HIGH (ref 8–23)
CO2: 28 mmol/L (ref 22–32)
Calcium: 9 mg/dL (ref 8.9–10.3)
Chloride: 105 mmol/L (ref 98–111)
Creatinine, Ser: 1.47 mg/dL — ABNORMAL HIGH (ref 0.44–1.00)
GFR calc Af Amer: 42 mL/min — ABNORMAL LOW (ref >60)
GFR calc non Af Amer: 36 mL/min — ABNORMAL LOW (ref >60)
Glucose, Bld: 109 mg/dL — ABNORMAL HIGH (ref 70–99)
Potassium: 4.3 mmol/L (ref 3.5–5.1)
Sodium: 140 mmol/L (ref 135–145)

## 2020-08-19 MED ORDER — CARVEDILOL 25 MG PO TABS: 25.0000 mg | ORAL_TABLET | Freq: Two times a day (BID) | ORAL | 2 refills | Status: DC

## 2020-08-19 MED ORDER — ENTRESTO 97-103 MG PO TABS: 1.0000 | ORAL_TABLET | Freq: Two times a day (BID) | ORAL | 6 refills | Status: DC

## 2020-08-19 NOTE — Patient Instructions (Signed)
INCREASE Entresto 97/103 twice daily  Labs done today, your results will be available in MyChart, we will contact you for abnormal readings.  Repeat labs in 10-14 days  Please call our office in 4 months to schedule your follow up appointment and echocardiogram  Your physician has requested that you have an echocardiogram. Echocardiography is a painless test that uses sound waves to create images of your heart. It provides your doctor with information about the size and shape of your heart and how well your heart's chambers and valves are working. This procedure takes approximately one hour. There are no restrictions for this procedure.   If you have any questions or concerns before your next appointment please send [place] a message through [place] or call our office at [phone].    TO LEAVE A MESSAGE FOR THE NURSE SELECT OPTION 2, PLEASE LEAVE A MESSAGE INCLUDING: . YOUR NAME . DATE OF BIRTH . CALL BACK NUMBER . REASON FOR CALL**this is important as we prioritize the call backs  [place] AS LONG AS YOU CALL BEFORE 4:00 PM  At the [facility], you and your health needs are our priority. As part of our continuing mission to provide you with exceptional heart care, we have created designated Provider Care Teams. These Care Teams include your primary Cardiologist (physician) and Advanced Practice Providers (APPs- Physician Assistants and Nurse Practitioners) who all work together to provide you with the care you need, when you need it.   You may see any of the following providers on your designated Care Team at your next follow up: Marland Kitchen Dr Glori Bickers . Dr [name] . Darrick Grinder, NP . Lyda Jester, PA . Audry Riles, PharmD   Please be sure to bring in all your medications bottles to every appointment.

## 2020-08-19 NOTE — Progress Notes (Signed)
Patient ID: Vanessa [name], female   DOB: 02-10-1951, 69 y.o.   MRN: [id number]     ID:  Vanessa [name], DOB [birth year], MRN [id number]   Provider location: [facility] Type of Visit: Established patient   PCP:  Lennie Odor, PA-C  Cardiologist:  Dr. Aundra Dubin   History of Present Illness: Vanessa [name] is a 69 y.o. female who has history of nonischemic cardiomyopathy and presents for followup of CHF. Patient has had a low EF recognized since at least 9/14, when echo showed EF 40-45%.  In 18-Jun-2023, her child died, which led to significant stress.  She developed worsening exertional dyspnea and subsequent echo showed a fall in EF to 25-30%.  She had left and right heart catheterization in 3/16 with no significant coronary disease detected and moderately elevated right and left heart filling pressures along with pulmonary venous hypertension.  Echo was done 12/16: EF 45%, diffuse hypokinesis. She had a repeat echo 11/17 => EF 45% with moderately dilated LV, moderate MR.  Echo 11/18 showed EF stable at 45% with mild LV dilation and mildly decreased RV systolic function, moderate MR.   Spironolactone was stopped due to elevated K. She apparently did not tolerate Veltassa well.   Echo was done in 11/19.  EF 30-35% with mild LV hypertrophy, mild-moderately decreased RV systolic function, possible severe MR with restricted posterior leaflet, PASP 50 mmHg, severe LAE.  TEE was done in 11/19 to assess mitral regurgitation.  This showed severe MR with restricted posterior leaflet.   In 1/20, she had Mitraclip with reduction in MR to 1+.    She had echo in 9/20 with EF 30-35%, severe LAE, normal RV, s/p Mitraclip with mild-moderate MR and mild MS.  The IVC was dilated, suggesting volume overload.   She was unable to tolerate Bidil due to lightheadedness.   Echo in 1/21 showed EF 40-45%, RV normal, biatrial dilation, mild mitral stenosis mean 5 mmHg, mild-moderate MR (s/p Mitraclip).    She is stable symptomatically.  She is short of breath if she "walks fast," no dyspnea with slow/steady pace. No orthopnea/PND.  No chest pain.  She is short of breath with stairs. No lightheadedness. Weight is down 6 lbs.   ECG (personally reviewed): NSR, PVC, inferior Qs, anterolateral and inferior TWIs  Labs (7/16): K 4.5, creatinine 1.12, BNP 596 Labs (8/16): K 4 => 5.1, creatinine 1.29 => 1.5, BNP 388 => 426 Labs (9/16): K 4, creatinine 1.08, BNP 502 Labs (12/16): K 4.9, creatinine 1.57 Labs (5/17): K 5.2, creatinine 1.3 Labs (8/17): BNP 152 Labs (11/17): K 4.9, creatinine 1.32, BNP 236 Labs (8/18): K 4.6, creatinine 1.57 Labs (11/18): K 3.8, creatinine 1.29 Labs (2/19): K 5.1, creatinine 1.44 Labs (9/19): K 3.8, creatinine 1.08 Labs (11/19): K 4.3, creatinine 1.3 Labs (1/20): K 4.8, creatinine 1.68 Labs (3/20): K 4.4, creatinine 1.5 Labs (9/20): K 4.4, creatinine 1.77 => 1.48, LDL 109 Labs (10/20): K 4.6, creatinine 1.66 Labs (12/20): K 4.7, creatinine 1.59 Labs (3/21): K 4.5, creatinine 1.73 Labs (6/21): K 4.6, creatinine 1.7  PMH: 1. Cardiomyopathy: Nonischemic.  Echo (9/14) with EF 40-45%.  Echo (2/16) with EF 25-30%.  Echo (4/16) with EF 30-35%, moderate MR.  Echo (6/16) with EF 40%, moderate LV dilation, moderate diastolic dysfunction, moderate MR, moderate TR, PA systolic pressure 52 mmHg. RHC/LHC (3/16) with no significant CAD, EF 20-25%, 3+ MR; mean RA 13, PA 70/30 mean 45, mean PCWP 31 mmHg, CI 2.2, PVR 3.1 WU.  Echo (12/16) with EF 45%, diffuse hypokinesis, moderate MR, normal RV size and systolic function.  - Echo (11/17): EF 45%, moderately dilated LV, moderate eccentric posterior MR, PASP 42 mmHg, normal RV size and systolic function.  - Echo (11/18): EF 45%, mild LV dilation, moderate diastolic dysfunction, normal RV size with mildly decreased systolic function, PASP 50 mmHg, moderate MR.  - Echo (11/19): EF 30-35%, mild LV dilation, severe LAE, mild-moderately  decreased RV systolic function, possible severe MR with restricted posterior leaflet, PASP 50 mmHg.  - Echo (1/20): EF 30-35%, severe LV dilation, s/p Mitraclip with mild MR and mild MS, PASP 48 mmHg.  - RHC (1/20, with Mitraclip): mean RA 10, mean LA 18 - Echo (2/20): EF 30-35%, s/p mitraclip with mild mitral stenosis, mean MV gradient 5 mmHg, moderate MR, PASP 56 mmHg.  - Echo (9/20): EF 30-35%, severe LAE, normal RV, s/p Mitraclip with mild-moderate MR and mild MS.  The IVC was dilated, suggesting volume overload.  - Echo (1/21): EF 40-45%, RV normal, biatrial dilation, mild mitral stenosis mean 5 mmHg, mild-moderate MR (s/p Mitraclip).  2. PVCs: Holter (10/15) with 12% PVCs. 3. Pulmonary venous hypertension: CTA chest (7/16) negative for PE. PFTs (7/16) with minimal obstruction, mild restriction, moderately decreased DLCO.  4. Thyroid nodules: Biopsy benign.  5. CKD stage 3 6. Mitral regurgitation: TEE (11/19) with EF 30-35%, moderate LV dilation, severe MR with posterior leaflet restriction and ERO 0.41 cm^2, no mitral stenosis, mild to moderately decreased RV systolic function, peak RV-RA gradient 50 mmHg.  - Mitraclip placement x 2 in 1/20 with 1+ residual MR.  - Echo (2/20) with moderate residual MR, mild MS with mean gradient 5 mmHg.  - Echo (9/20) with mild-moderate MR, mild MS.  - Echo (1/21) with mild-moderate MR, mild MS s/p Mitraclip  SH: Lives alone, only child passed away in 2023/07/13, nonsmoker, no ETOH or drugs.   FH: Father with MIs  ROS: All systems reviewed and negative except as per HPI.   Current Outpatient Medications  Medication Sig Dispense Refill  . amoxicillin (AMOXIL) 500 MG tablet Take 4 tablets by mouth one hour prior to dental appointments 8 tablet 11  . aspirin 81 MG EC tablet Take 1 tablet (81 mg total) by mouth daily.    . Calcium Carbonate-Vitamin D (CALCIUM 600+D PO) Take 1 tablet by mouth daily.    . carvedilol (COREG) 25 MG tablet Take 1 tablet (25 mg  total) by mouth 2 (two) times daily with a meal. 180 tablet 2  . empagliflozin (JARDIANCE) 10 MG TABS tablet Take 10 mg by mouth daily before breakfast. 90 tablet 3  . furosemide (LASIX) 40 MG tablet Take 1 tablet (40 mg total) by mouth daily. 90 tablet 3  . ibandronate (BONIVA) 150 MG tablet Take 150 mg every 30 (thirty) days by mouth. Take in the morning with a full glass of water, on an empty stomach, and do not take anything else by mouth or lie down for the next 30 min.    . sacubitril-valsartan (ENTRESTO) 97-103 MG Take 1 tablet by mouth 2 (two) times daily. 60 tablet 6   No current facility-administered medications for this encounter.   BP 125/80   Pulse 64   Wt 87.8 kg (193 lb 9.6 oz)   SpO2 98%   BMI 35.41 kg/m    Wt Readings from Last 3 Encounters:  08/19/20 87.8 kg (193 lb 9.6 oz)  05/07/20 90.4 kg (199 lb 3.2 oz)  01/31/20 90.4  kg (199 lb 3.2 oz)   Exam:   BP 125/80   Pulse 64   Wt 87.8 kg (193 lb 9.6 oz)   SpO2 98%   BMI 35.41 kg/m  General: NAD Neck: Goiter. No JVD, no thyromegaly or thyroid nodule.  Lungs: Clear to auscultation bilaterally with normal respiratory effort. CV: Nondisplaced PMI.  Heart regular S1/S2, no S3/S4, 2/6 HSM apex.  No peripheral edema.  No carotid bruit.  Normal pedal pulses.  Abdomen: Soft, nontender, no hepatosplenomegaly, no distention.  Skin: Intact without lesions or rashes.  Neurologic: Alert and oriented x 3.  Psych: Normal affect. Extremities: No clubbing or cyanosis.  HEENT: Normal.   Assessment/Plan: 1. Chronic systolic CHF: Nonischemic cardiomyopathy.  EF up to 45% on 12/16, repeat echo 11/18 shows that EF remains about 45%.  Etiology of NICM still unclear. Had frequent PVCs by past holter (12%) but doesn't explain her cardiomyopathy. Had improvement of palpitations on Coreg. Cannot rule out myocarditis. May also have component of Takotsubo with passing of child but EF has not improved as completely as would be expected with  Takotsubo. Have previously discussed cardiac MRI to look for infiltrative disease but she says that she would be too claustrophobic.  Echo in 11/19 showed EF down to 30-35% and mild to moderate RV systolic function.  There also appeared to be severe mitral regurgitation, which seemed progressive from the past. TEE in 11/19 confirmed severe MR with restricted posterior leaflet => Mitraclip 1/20 with decrease in MR to 1+.  Echo in 1/20 showed EF 30-35% with mild MR s/p Mitraclip. Echo in 9/20 with stable EF 30-35% but dilated IVC suggestive of volume overload.  Echo in 1/21 with EF 40-45%, normal RV.  NYHA class II symptoms.  Weight down 6 lbs.  - Continue Lasix 40 mg daily.  BMET today.  - She is off spironolactone with hyperkalemia.   - Continue Coreg 25 mg bid.  - Increase Entresto to 97/103 bid, BMET 10 days.   - She did not tolerate Bidil 1/2 tab tid.  - If K rises again, she did not tolerate Veltassa so would try Lokelma.  - Continue empagliflozin 10 mg daily.      - EF now above ICD range.  Repeat echo at followup.   2. PVCs: Frequent (12%) on 10/15 holter.   Pt refused repeat holter monitor for further assessment. PVC count was probably not high enough to cause cardiomyopathy, unlikely primary etiology. - Improved palpitations on Coreg.   3. Pulmonary hypertension: Primarily pulmonary venous HTN (PVR 3.1) from elevated left atrial pressure (previous RHC above).   - Best treatment remains adequate diuresis. 4. Mitral regurgitation:  TEE in 11/19 with severe MR, restricted posterior leaflet.  She had Mitraclip in 1/20. Post-op echo showed mild MR and mild mitral stenosis.  Most recent echo in 1/21 showed mild-moderate MR, mild MS though she still has a prominent murmur.  - Will need antibiotics for dental prophylaxis.  - Repeat echo in 1/22.  5. Goiter: Check TSH.   Followup in 1/22 with echo.   Signed, [name], MD  08/19/2020  [facility] [address] [facility] [place] [address] [phone] (office) [phone] (fax)

## 2020-08-21 ENCOUNTER — Telehealth (HOSPITAL_COMMUNITY): Payer: Self-pay

## 2020-08-21 DIAGNOSIS — R7989 Other specified abnormal findings of blood chemistry: Secondary | ICD-10-CM

## 2020-08-21 NOTE — Telephone Encounter (Signed)
-----   Message from Larey Dresser, MD sent at 08/20/2020 11:22 AM EDT ----- TSH low.  Need to repeat TSH and check free T3 and free T4 asap, concern for hyperthyroidism.

## 2020-08-21 NOTE — Telephone Encounter (Signed)
Patient advised and verbalized understanding.patient will have repeat labs drawn on 10/6. Labs entered   Orders Placed This Encounter  Procedures  . TSH    Standing Status:   Future    Standing Expiration Date:   08/21/2021    Order Specific Question:   Release to patient    Answer:   Immediate  . T4, free    Standing Status:   Future    Standing Expiration Date:   08/21/2021    Order Specific Question:   Release to patient    Answer:   Immediate  . T3, free    Standing Status:   Future    Standing Expiration Date:   08/21/2021    Order Specific Question:   Release to patient    Answer:   Immediate

## 2020-09-04 ENCOUNTER — Ambulatory Visit (HOSPITAL_COMMUNITY)
Admission: RE | Admit: 2020-09-04 | Discharge: 2020-09-04 | Disposition: A | Discharge status: Home / Self Care | Payer: PPO | Source: Ambulatory Visit | Attending: Internal Medicine | Admitting: Internal Medicine

## 2020-09-04 ENCOUNTER — Other Ambulatory Visit: Payer: Self-pay

## 2020-09-04 DIAGNOSIS — R7989 Other specified abnormal findings of blood chemistry: Secondary | ICD-10-CM | POA: Insufficient documentation

## 2020-09-04 DIAGNOSIS — I5022 Chronic systolic (congestive) heart failure: Secondary | ICD-10-CM | POA: Diagnosis not present

## 2020-09-04 LAB — BASIC METABOLIC PANEL
Anion gap: 11 (ref 5–15)
BUN: 24 mg/dL — ABNORMAL HIGH (ref 8–23)
CO2: 25 mmol/L (ref 22–32)
Calcium: 9.3 mg/dL (ref 8.9–10.3)
Chloride: 105 mmol/L (ref 98–111)
Creatinine, Ser: 1.37 mg/dL — ABNORMAL HIGH (ref 0.44–1.00)
GFR calc non Af Amer: 39 mL/min — ABNORMAL LOW (ref >60)
Glucose, Bld: 137 mg/dL — ABNORMAL HIGH (ref 70–99)
Potassium: 4.2 mmol/L (ref 3.5–5.1)
Sodium: 141 mmol/L (ref 135–145)

## 2020-09-04 LAB — TSH: TSH: 0.535 u[IU]/mL (ref 0.350–4.500)

## 2020-09-04 LAB — T4, FREE: Free T4: 1.37 ng/dL — ABNORMAL HIGH (ref 0.61–1.12)

## 2020-09-05 LAB — T3, FREE: T3, Free: 2.8 pg/mL (ref 2.0–4.4)

## 2020-12-13 ENCOUNTER — Other Ambulatory Visit: Payer: Self-pay

## 2020-12-13 ENCOUNTER — Ambulatory Visit (HOSPITAL_COMMUNITY)
Admission: RE | Admit: 2020-12-13 | Discharge: 2020-12-13 | Disposition: A | Discharge status: Home / Self Care | Payer: PPO | Source: Ambulatory Visit | Attending: Physician Assistant | Admitting: Physician Assistant

## 2020-12-13 ENCOUNTER — Encounter (HOSPITAL_COMMUNITY): Payer: Self-pay | Admitting: Cardiology

## 2020-12-13 ENCOUNTER — Ambulatory Visit (HOSPITAL_BASED_OUTPATIENT_CLINIC_OR_DEPARTMENT_OTHER)
Admission: RE | Admit: 2020-12-13 | Discharge: 2020-12-13 | Disposition: A | Discharge status: Home / Self Care | Payer: PPO | Source: Ambulatory Visit | Attending: Cardiology | Admitting: Cardiology

## 2020-12-13 VITALS — BP 128/80 | HR 64 | Wt 198.4 lb

## 2020-12-13 DIAGNOSIS — Z79899 Other long term (current) drug therapy: Secondary | ICD-10-CM | POA: Diagnosis not present

## 2020-12-13 DIAGNOSIS — Z7982 Long term (current) use of aspirin: Secondary | ICD-10-CM | POA: Diagnosis not present

## 2020-12-13 DIAGNOSIS — I428 Other cardiomyopathies: Secondary | ICD-10-CM | POA: Insufficient documentation

## 2020-12-13 DIAGNOSIS — I272 Pulmonary hypertension, unspecified: Secondary | ICD-10-CM | POA: Insufficient documentation

## 2020-12-13 DIAGNOSIS — N183 Chronic kidney disease, stage 3 unspecified: Secondary | ICD-10-CM | POA: Diagnosis not present

## 2020-12-13 DIAGNOSIS — I5022 Chronic systolic (congestive) heart failure: Secondary | ICD-10-CM

## 2020-12-13 DIAGNOSIS — I052 Rheumatic mitral stenosis with insufficiency: Secondary | ICD-10-CM | POA: Insufficient documentation

## 2020-12-13 DIAGNOSIS — I130 Hypertensive heart and chronic kidney disease with heart failure and stage 1 through stage 4 chronic kidney disease, or unspecified chronic kidney disease: Secondary | ICD-10-CM | POA: Insufficient documentation

## 2020-12-13 DIAGNOSIS — I493 Ventricular premature depolarization: Secondary | ICD-10-CM | POA: Insufficient documentation

## 2020-12-13 HISTORY — DX: Heart failure, unspecified: I50.9

## 2020-12-13 LAB — BASIC METABOLIC PANEL
Anion gap: 10 (ref 5–15)
BUN: 22 mg/dL (ref 8–23)
CO2: 27 mmol/L (ref 22–32)
Calcium: 9.3 mg/dL (ref 8.9–10.3)
Chloride: 106 mmol/L (ref 98–111)
Creatinine, Ser: 1.26 mg/dL — ABNORMAL HIGH (ref 0.44–1.00)
GFR, Estimated: 46 mL/min — ABNORMAL LOW (ref >60)
Glucose, Bld: 109 mg/dL — ABNORMAL HIGH (ref 70–99)
Potassium: 4.3 mmol/L (ref 3.5–5.1)
Sodium: 143 mmol/L (ref 135–145)

## 2020-12-13 LAB — ECHOCARDIOGRAM COMPLETE
Area-P 1/2: 1.65 cm2
Calc EF: 43.6 %
MV M vel: 5.29 m/s
MV Peak grad: 111.7 mmHg
MV VTI: 1.55 cm2
S' Lateral: 5.2 cm
Single Plane A2C EF: 42.7 %
Single Plane A4C EF: 44.1 %

## 2020-12-13 MED ORDER — AMOXICILLIN 500 MG PO TABS: ORAL_TABLET | ORAL | 11 refills | Status: DC

## 2020-12-13 MED ORDER — FUROSEMIDE 40 MG PO TABS: ORAL_TABLET | ORAL | 3 refills | Status: DC

## 2020-12-13 NOTE — Progress Notes (Signed)
ReDS Vest / Clip - 12/13/20 0900      ReDS Vest / Clip   Station Marker A    Ruler Value 27    ReDS Value Range High volume overload    ReDS Actual Value 43

## 2020-12-13 NOTE — Progress Notes (Signed)
  Echocardiogram 2D Echocardiogram has been performed.  Michiel Cowboy 12/13/2020, 8:44 AM

## 2020-12-13 NOTE — Patient Instructions (Addendum)
Labs done today. We will contact you only if your labs are abnormal.  INCREASE Lasix to 40mg (1 tablet) by mouth 2 times daily for 3 days THEN take 40mg  (1 tablet) by mouth every morning and 20mg  (1/2 tablet) by mouth every evening.  No other medication changes were made. Please continue all current medications as prescribed.  Your physician recommends that you schedule a follow-up appointment in: 10 days for a lab only appointment, 3 weeks for an appointment with APP clinic and in 2 months for an appointment with Dr. Aundra Dubin   If you have any questions or concerns before your next appointment please send [place] a message through [facility] or call our office at [phone].    TO LEAVE A MESSAGE FOR THE NURSE SELECT OPTION 2, PLEASE LEAVE A MESSAGE INCLUDING: . YOUR NAME . DATE OF BIRTH . CALL BACK NUMBER . REASON FOR CALL**this is important as we prioritize the call backs  YOU WILL RECEIVE A CALL BACK THE SAME DAY AS LONG AS YOU CALL BEFORE 4:00 PM   Do the following things EVERYDAY: 1) Weigh yourself in the morning before breakfast. Write it down and keep it in a log. 2) Take your medicines as prescribed 3) Eat low salt foods-Limit salt (sodium) to 2000 mg per day.  4) Stay as active as you can everyday 5) Limit all fluids for the day to less than 2 liters   [facility] you and your health needs are our priority. As part of our continuing mission to provide you with exceptional heart care, we have created designated Provider Care Teams. These Care Teams include your primary Cardiologist (physician) and Advanced Practice Providers (APPs- Physician Assistants and Nurse Practitioners) who all work together to provide you with the care you need, when you need it.   You may see any of the following providers on your designated Care Team at your next follow up: Marland Kitchen Dr Glori Bickers . Dr [name] . Darrick Grinder, NP . Lyda Jester, PA . Audry Riles,  PharmD   Please be sure to bring in all your medications bottles to every appointment.

## 2020-12-13 NOTE — Progress Notes (Signed)
Patient is being seen in the [facility] Clinic. VS, EKG, and device interrogations performed in the clinic. Dr McLean is at home and seeing patients via telemedicine as he is in quarantine.   

## 2020-12-14 NOTE — Addendum Note (Signed)
Encounter addended by: Larey Dresser, MD on: 12/14/2020 10:00 PM  Actions taken: Clinical Note Signed, Level of Service modified

## 2020-12-14 NOTE — Progress Notes (Signed)
Heart Failure TeleHealth Note  Due to national recommendations of social distancing due to [place] 19, Audio/video telehealth visit is felt to be most appropriate for this patient at this time.  See MyChart message from today for patient consent regarding telehealth for [facility].  Date:  12/14/2020   ID:  [name], DOB January 09, 1951, MRN [id number]  Location: Home  Provider location: [place] [facility] Type of Visit: Established patient   PCP:  Lennie Odor, PA  Cardiologist:   Dr. Aundra Dubin   History of Present Illness: Vanessa [name] is a 70 y.o. female who presents via audio/video conferencing for a telehealth visit today.     she denies symptoms worrisome for COVID 19.   Patient has history of nonischemic cardiomyopathy and presents for followup of CHF. Patient has had a low EF recognized since at least 9/14, when echo showed EF 40-45%.  In 06-30-23, her child died, which led to significant stress.  She developed worsening exertional dyspnea and subsequent echo showed a fall in EF to 25-30%.  She had left and right heart catheterization in 3/16 with no significant coronary disease detected and moderately elevated right and left heart filling pressures along with pulmonary venous hypertension.  Echo was done 12/16: EF 45%, diffuse hypokinesis. She had a repeat echo 11/17 => EF 45% with moderately dilated LV, moderate MR.  Echo 11/18 showed EF stable at 45% with mild LV dilation and mildly decreased RV systolic function, moderate MR.   Spironolactone was stopped due to elevated K. She apparently did not tolerate Veltassa well.   Echo was done in 11/19.  EF 30-35% with mild LV hypertrophy, mild-moderately decreased RV systolic function, possible severe MR with restricted posterior leaflet, PASP 50 mmHg, severe LAE.  TEE was done in 11/19 to assess mitral regurgitation.  This showed severe MR with restricted posterior leaflet.   In 1/20, she had Mitraclip with  reduction in MR to 1+.    She had echo in 9/20 with EF 30-35%, severe LAE, normal RV, s/p Mitraclip with mild-moderate MR and mild MS.  The IVC was dilated, suggesting volume overload.   She was unable to tolerate Bidil due to lightheadedness.   Echo in 1/21 showed EF 40-45%, RV normal, biatrial dilation, mild mitral stenosis mean 5 mmHg, mild-moderate MR (s/p Mitraclip).  Echo was done today and reviewed, EF 40-45% with moderate LV dilation, normal RV size and systolic function, severe LAE, s/p Mitraclip with probably mild mitral stenosis (mean gradient 7, MVA 2.2 cm^2 by PHT), moderate MR.   She is more short of breath today than usual.  She has dyspnea with moderate activity.  No edema.  She chronically sleeps on 2 pillows.  No chest pain.  No lightheadedness or palpitations, she is in NSR today.   ECG (personally reviewed): NSR, inferior TWIs.   Labs (7/16): K 4.5, creatinine 1.12, BNP 596 Labs (8/16): K 4 => 5.1, creatinine 1.29 => 1.5, BNP 388 => 426 Labs (9/16): K 4, creatinine 1.08, BNP 502 Labs (12/16): K 4.9, creatinine 1.57 Labs (5/17): K 5.2, creatinine 1.3 Labs (8/17): BNP 152 Labs (11/17): K 4.9, creatinine 1.32, BNP 236 Labs (8/18): K 4.6, creatinine 1.57 Labs (11/18): K 3.8, creatinine 1.29 Labs (2/19): K 5.1, creatinine 1.44 Labs (9/19): K 3.8, creatinine 1.08 Labs (11/19): K 4.3, creatinine 1.3 Labs (1/20): K 4.8, creatinine 1.68 Labs (3/20): K 4.4, creatinine 1.5 Labs (9/20): K 4.4, creatinine 1.77 => 1.48, LDL 109 Labs (  10/20): K 4.6, creatinine 1.66 Labs (12/20): K 4.7, creatinine 1.59 Labs (3/21): K 4.5, creatinine 1.73 Labs (6/21): K 4.6, creatinine 1.7 Labs (10/21): TSH normal, K 4.2, creatinine 1.37  REDS clip 43%  PMH: 1. Cardiomyopathy: Nonischemic.  Echo (9/14) with EF 40-45%.  Echo (2/16) with EF 25-30%.  Echo (4/16) with EF 30-35%, moderate MR.  Echo (6/16) with EF 40%, moderate LV dilation, moderate diastolic dysfunction, moderate MR, moderate TR, PA  systolic pressure 52 mmHg. RHC/LHC (3/16) with no significant CAD, EF 20-25%, 3+ MR; mean RA 13, PA 70/30 mean 45, mean PCWP 31 mmHg, CI 2.2, PVR 3.1 WU.  Echo (12/16) with EF 45%, diffuse hypokinesis, moderate MR, normal RV size and systolic function.  - Echo (11/17): EF 45%, moderately dilated LV, moderate eccentric posterior MR, PASP 42 mmHg, normal RV size and systolic function.  - Echo (11/18): EF 45%, mild LV dilation, moderate diastolic dysfunction, normal RV size with mildly decreased systolic function, PASP 50 mmHg, moderate MR.  - Echo (11/19): EF 30-35%, mild LV dilation, severe LAE, mild-moderately decreased RV systolic function, possible severe MR with restricted posterior leaflet, PASP 50 mmHg.  - Echo (1/20): EF 30-35%, severe LV dilation, s/p Mitraclip with mild MR and mild MS, PASP 48 mmHg.  - RHC (1/20, with Mitraclip): mean RA 10, mean LA 18 - Echo (2/20): EF 30-35%, s/p mitraclip with mild mitral stenosis, mean MV gradient 5 mmHg, moderate MR, PASP 56 mmHg.  - Echo (9/20): EF 30-35%, severe LAE, normal RV, s/p Mitraclip with mild-moderate MR and mild MS.  The IVC was dilated, suggesting volume overload.  - Echo (1/21): EF 40-45%, RV normal, biatrial dilation, mild mitral stenosis mean 5 mmHg, mild-moderate MR (s/p Mitraclip).  - Echo (1/22): EF 40-45% with moderate LV dilation, normal RV size and systolic function, severe LAE, s/p Mitraclip with probably mild mitral stenosis (mean gradient 7, MVA 2.2 cm^2 by PHT), moderate MR. 2. PVCs: Holter (10/15) with 12% PVCs. 3. Pulmonary venous hypertension: CTA chest (7/16) negative for PE. PFTs (7/16) with minimal obstruction, mild restriction, moderately decreased DLCO.  4. Thyroid nodules: Biopsy benign.  5. CKD stage 3 6. Mitral regurgitation: TEE (11/19) with EF 30-35%, moderate LV dilation, severe MR with posterior leaflet restriction and ERO 0.41 cm^2, no mitral stenosis, mild to moderately decreased RV systolic function, peak RV-RA  gradient 50 mmHg.  - Mitraclip placement x 2 in 1/20 with 1+ residual MR.  - Echo (2/20) with moderate residual MR, mild MS with mean gradient 5 mmHg.  - Echo (9/20) with mild-moderate MR, mild MS.  - Echo (1/21) with mild-moderate MR, mild MS s/p Mitraclip - Echo (1/22) with moderate residual MR, mild-moderate mitral stenosis.   SH: Lives alone, only child passed away in 06-22-23, nonsmoker, no ETOH or drugs.   FH: Father with MIs  ROS: All systems reviewed and negative except as per HPI.   Current Outpatient Medications  Medication Sig Dispense Refill  . aspirin 81 MG EC tablet Take 1 tablet (81 mg total) by mouth daily.    . Calcium Carbonate-Vitamin D (CALCIUM 600+D PO) Take 1 tablet by mouth daily.    . carvedilol (COREG) 25 MG tablet Take 1 tablet (25 mg total) by mouth 2 (two) times daily with a meal. 180 tablet 2  . empagliflozin (JARDIANCE) 10 MG TABS tablet Take 10 mg by mouth daily before breakfast. 90 tablet 3  . ibandronate (BONIVA) 150 MG tablet Take 150 mg by mouth every 30 (thirty) days.  Take in the morning with a full glass of water, on an empty stomach, and do not take anything else by mouth or lie down for the next 30 min.    . sacubitril-valsartan (ENTRESTO) 97-103 MG Take 1 tablet by mouth 2 (two) times daily. 60 tablet 6  . amoxicillin (AMOXIL) 500 MG tablet Take 4 tablets by mouth one hour prior to dental appointments 8 tablet 11  . furosemide (LASIX) 40 MG tablet Take 1 tablet (40 mg total) by mouth every morning AND 0.5 tablets (20 mg total) every evening. 135 tablet 3   No current facility-administered medications for this encounter.   BP 128/80   Pulse 64   Wt 90 kg (198 lb 6.4 oz)   SpO2 97%   BMI 36.29 kg/m    Wt Readings from Last 3 Encounters:  12/13/20 90 kg (198 lb 6.4 oz)  08/19/20 87.8 kg (193 lb 9.6 oz)  05/07/20 90.4 kg (199 lb 3.2 oz)   Exam:  (Video/Tele Health Call; Exam is subjective and or/visual.) General:  Speaks in full sentences. No  resp difficulty. Neck: JVP 8-9 cm Lungs: Normal respiratory effort with conversation.  Abdomen: Non-distended per patient report Extremities: Pt denies edema. Neuro: Alert & oriented x 3.   Assessment/Plan: 1. Chronic systolic CHF: Nonischemic cardiomyopathy.  EF up to 45% on 12/16, repeat echo 11/18 shows that EF remains about 45%.  Etiology of NICM still unclear. Had frequent PVCs by past holter (12%) but doesn't explain her cardiomyopathy. Had improvement of palpitations on Coreg. Cannot rule out myocarditis. May also have component of Takotsubo with passing of child but EF has not improved as completely as would be expected with [place]ubo. Have previously discussed cardiac MRI to look for infiltrative disease but she says that she would be too claustrophobic.  Echo in 11/19 showed EF down to 30-35% and mild to moderate RV systolic function.  There also appeared to be severe mitral regurgitation, which seemed progressive from the past. TEE in 11/19 confirmed severe MR with restricted posterior leaflet => Mitraclip 1/20 with decrease in MR to 1+.  Echo in 1/20 showed EF 30-35% with mild MR s/p Mitraclip. Echo in 9/20 with stable EF 30-35% but dilated IVC suggestive of volume overload.  Echo in 1/22 with EF 40-45%, normal RV.  She is more short of breath today, NYHA class III.  REDS clip elevated today at 43%.  Weight up 5 lbs.   - Increase Lasix to 40 mg bid x 3 days then 40 qam/20 qpm.  BMET today and in 10 days.   - She is off spironolactone with hyperkalemia.   - Continue Coreg 25 mg bid.  - Continue Entresto 97/103 bid.   - She did not tolerate Bidil 1/2 tab tid.  - If K rises again, she did not tolerate Veltassa so would try Lokelma.  - Continue empagliflozin 10 mg daily.      - EF now above ICD range.   2. PVCs: Frequent (12%) on 10/15 holter.   Pt refused repeat holter monitor for further assessment. PVC count was probably not high enough to cause cardiomyopathy, unlikely primary  etiology. - Improved palpitations on Coreg.   3. Pulmonary hypertension: Primarily pulmonary venous HTN (PVR 3.1) from elevated left atrial pressure (previous RHC above).   - Best treatment remains adequate diuresis. 4. Mitral regurgitation:  TEE in 11/19 with severe MR, restricted posterior leaflet.  She had Mitraclip in 1/20. Post-op echo showed mild MR and mild mitral  stenosis.  Echo today showed moderate MR, mild-moderate MS. - Will need antibiotics for dental prophylaxis.   COVID screen The patient does not have any symptoms that suggest any further testing/ screening at this time.  Social distancing reinforced today.  Patient Risk: After full review of this patients clinical status, I feel that they are at moderate risk for cardiac decompensation at this time.  Relevant cardiac medications were reviewed at length with the patient today. The patient does not have concerns regarding their medications at this time.   Recommended follow-up:  3 wks with APP.   Today, I have spent 21 minutes with the patient with telehealth technology discussing the above issues .    Signed, [name], MD  12/14/2020  [place] [address] [facility] and [place] [address] [phone] (office) [phone] (fax)

## 2020-12-24 ENCOUNTER — Ambulatory Visit (HOSPITAL_COMMUNITY)
Admission: RE | Admit: 2020-12-24 | Discharge: 2020-12-24 | Disposition: A | Discharge status: Home / Self Care | Payer: PPO | Source: Ambulatory Visit | Attending: Cardiology | Admitting: Cardiology

## 2020-12-24 ENCOUNTER — Other Ambulatory Visit: Payer: Self-pay

## 2020-12-24 DIAGNOSIS — I5022 Chronic systolic (congestive) heart failure: Secondary | ICD-10-CM | POA: Insufficient documentation

## 2020-12-24 LAB — BASIC METABOLIC PANEL
Anion gap: 6 (ref 5–15)
BUN: 40 mg/dL — ABNORMAL HIGH (ref 8–23)
CO2: 26 mmol/L (ref 22–32)
Calcium: 8.8 mg/dL — ABNORMAL LOW (ref 8.9–10.3)
Chloride: 107 mmol/L (ref 98–111)
Creatinine, Ser: 1.46 mg/dL — ABNORMAL HIGH (ref 0.44–1.00)
GFR, Estimated: 39 mL/min — ABNORMAL LOW (ref >60)
Glucose, Bld: 111 mg/dL — ABNORMAL HIGH (ref 70–99)
Potassium: 4.3 mmol/L (ref 3.5–5.1)
Sodium: 139 mmol/L (ref 135–145)

## 2021-01-06 NOTE — Progress Notes (Signed)
Patient ID: Vanessa [name], female   DOB: 1950-12-09, 70 y.o.   MRN: [id number]     [facility] note  ID:  [name], [name] 07-26-1951, MRN [id number]   Provider location: [facility] Type of Visit: Established patient   PCP:  Lennie Odor, PA-C  Cardiologist:  Dr. Aundra Dubin   History of Present Illness: Vanessa [name] is a 70 y.o. female who has history of nonischemic cardiomyopathy and presents for followup of CHF.   Patient has had a low EF recognized since at least 9/14, when echo showed EF 40-45%.  In 21-Jun-2023, her child died, which led to significant stress.  She developed worsening exertional dyspnea and subsequent echo showed a fall in EF to 25-30%.  She had left and right heart catheterization in 3/16 with no significant coronary disease detected and moderately elevated right and left heart filling pressures along with pulmonary venous hypertension.  Echo was done 12/16: EF 45%, diffuse hypokinesis. She had a repeat echo 11/17 => EF 45% with moderately dilated LV, moderate MR.  Echo 11/18 showed EF stable at 45% with mild LV dilation and mildly decreased RV systolic function, moderate MR.   Spironolactone was stopped due to elevated K. She apparently did not tolerate Veltassa well.   Echo was done in 11/19.  EF 30-35% with mild LV hypertrophy, mild-moderately decreased RV systolic function, possible severe MR with restricted posterior leaflet, PASP 50 mmHg, severe LAE.  TEE was done in 11/19 to assess mitral regurgitation.  This showed severe MR with restricted posterior leaflet.   In 1/20, she had Mitraclip with reduction in MR to 1+.    She had echo in 9/20 with EF 30-35%, severe LAE, normal RV, s/p Mitraclip with mild-moderate MR and mild MS.  The IVC was dilated, suggesting volume overload.   She was unable to tolerate Bidil due to lightheadedness.   Echo in 1/21 showed EF 40-45%, RV normal, biatrial dilation, mild mitral stenosis mean 5  mmHg, mild-moderate MR (s/p Mitraclip).   She was seen in clinic 9/21 and was stable symptomatically.  She was short of breath if she "walks fast," no dyspnea with slow/steady pace.  She is short of breath with stairs. Entresto was increased to goal dose, with plans to repeat Echo at next visit.  Echo 1/22 EF 40-45% with moderate LV dilation, normal RV size and systolic function, severe LAE, s/p Mitraclip with probably mild mitral stenosis (mean gradient 7, MVA 2.2 cm^2 by PHT), moderate MR.  She was seem 1/22 via telehealth visit and was more SOB.  She had dyspnea with moderate activity. No edema. She chronically sleeps on 2 pillows. She was in NSR. Reds elevated at 43%, weight up 5 lbs. She was instructed to increase lasix to 40 mg bid x 3 days then 40 qam/20 qpm.  Today she returns for HF follow up. Overall feeling much better. SOB with walking fast or long distances, but feels breathing is better since last visit. Denies increasing SOB, CP, dizziness, edema, or PND/Orthopnea. Appetite ok. No fever or chills. Weight at home ~198-199 pounds. Taking all medications.   Reds Clip: 46% ECG (personally reviewed): not ordered today.  Labs (7/16): K 4.5, creatinine 1.12, BNP 596 Labs (8/16): K 4 => 5.1, creatinine 1.29 => 1.5, BNP 388 => 426 Labs (9/16): K 4, creatinine 1.08, BNP 502 Labs (12/16): K 4.9, creatinine 1.57 Labs (5/17): K 5.2, creatinine 1.3 Labs (8/17): BNP 152 Labs (11/17): K 4.9,  creatinine 1.32, BNP 236 Labs (8/18): K 4.6, creatinine 1.57 Labs (11/18): K 3.8, creatinine 1.29 Labs (2/19): K 5.1, creatinine 1.44 Labs (9/19): K 3.8, creatinine 1.08 Labs (11/19): K 4.3, creatinine 1.3 Labs (1/20): K 4.8, creatinine 1.68 Labs (3/20): K 4.4, creatinine 1.5 Labs (9/20): K 4.4, creatinine 1.77 => 1.48, LDL 109 Labs (10/20): K 4.6, creatinine 1.66 Labs (12/20): K 4.7, creatinine 1.59 Labs (3/21): K 4.5, creatinine 1.73 Labs (6/21): K 4.6, creatinine 1.7 Labs (10/21): TSH normal, K  4.2, creatinine 1.37 Labs (1/22): K 4.3, creatinine 1.46  PMH: 1. Cardiomyopathy: Nonischemic.  Echo (9/14) with EF 40-45%.  Echo (2/16) with EF 25-30%.  Echo (4/16) with EF 30-35%, moderate MR.  Echo (6/16) with EF 40%, moderate LV dilation, moderate diastolic dysfunction, moderate MR, moderate TR, PA systolic pressure 52 mmHg. RHC/LHC (3/16) with no significant CAD, EF 20-25%, 3+ MR; mean RA 13, PA 70/30 mean 45, mean PCWP 31 mmHg, CI 2.2, PVR 3.1 WU.  Echo (12/16) with EF 45%, diffuse hypokinesis, moderate MR, normal RV size and systolic function.  - Echo (11/17): EF 45%, moderately dilated LV, moderate eccentric posterior MR, PASP 42 mmHg, normal RV size and systolic function.  - Echo (11/18): EF 45%, mild LV dilation, moderate diastolic dysfunction, normal RV size with mildly decreased systolic function, PASP 50 mmHg, moderate MR.  - Echo (11/19): EF 30-35%, mild LV dilation, severe LAE, mild-moderately decreased RV systolic function, possible severe MR with restricted posterior leaflet, PASP 50 mmHg.  - Echo (1/20): EF 30-35%, severe LV dilation, s/p Mitraclip with mild MR and mild MS, PASP 48 mmHg.  - RHC (1/20, with Mitraclip): mean RA 10, mean LA 18 - Echo (2/20): EF 30-35%, s/p mitraclip with mild mitral stenosis, mean MV gradient 5 mmHg, moderate MR, PASP 56 mmHg.  - Echo (9/20): EF 30-35%, severe LAE, normal RV, s/p Mitraclip with mild-moderate MR and mild MS.  The IVC was dilated, suggesting volume overload.  - Echo (1/21): EF 40-45%, RV normal, biatrial dilation, mild mitral stenosis mean 5 mmHg, mild-moderate MR (s/p Mitraclip).  - Echo (1/22): EF 40-45% with moderate LV dilation, normal RV size and systolic function, severe LAE, s/p Mitraclip with probably mild mitral stenosis (mean gradient 7, MVA 2.2 cm^2 by PHT), moderate MR. 2. PVCs: Holter (10/15) with 12% PVCs. 3. Pulmonary venous hypertension: CTA chest (7/16) negative for PE. PFTs (7/16) with minimal obstruction, mild  restriction, moderately decreased DLCO.  4. Thyroid nodules: Biopsy benign.  5. CKD stage 3 6. Mitral regurgitation: TEE (11/19) with EF 30-35%, moderate LV dilation, severe MR with posterior leaflet restriction and ERO 0.41 cm^2, no mitral stenosis, mild to moderately decreased RV systolic function, peak RV-RA gradient 50 mmHg.  - Mitraclip placement x 2 in 1/20 with 1+ residual MR.  - Echo (2/20) with moderate residual MR, mild MS with mean gradient 5 mmHg.  - Echo (9/20) with mild-moderate MR, mild MS.  - Echo (1/21) with mild-moderate MR, mild MS s/p Mitraclip - Echo (1/22): s/p Mitraclip with probably mild mitral stenosis (mean gradient 7, MVA 2.2 cm^2 by PHT), moderate MR.  SH: Lives alone, only child passed away in 24-Jun-2023, nonsmoker, no ETOH or drugs.   FH: Father with MIs.  ROS: All systems reviewed and negative except as per HPI.   Current Outpatient Medications  Medication Sig Dispense Refill   amoxicillin (AMOXIL) 500 MG tablet Take 4 tablets by mouth one hour prior to dental appointments 8 tablet 11   aspirin 81 MG  EC tablet Take 1 tablet (81 mg total) by mouth daily.     Calcium Carbonate-Vitamin D (CALCIUM 600+D PO) Take 1 tablet by mouth daily.     carvedilol (COREG) 25 MG tablet Take 1 tablet (25 mg total) by mouth 2 (two) times daily with a meal. 180 tablet 2   empagliflozin (JARDIANCE) 10 MG TABS tablet Take 10 mg by mouth daily before breakfast. 90 tablet 3   furosemide (LASIX) 40 MG tablet Take 1 tablet (40 mg total) by mouth every morning AND 0.5 tablets (20 mg total) every evening. 135 tablet 3   ibandronate (BONIVA) 150 MG tablet Take 150 mg by mouth every 30 (thirty) days. Take in the morning with a full glass of water, on an empty stomach, and do not take anything else by mouth or lie down for the next 30 min.     sacubitril-valsartan (ENTRESTO) 97-103 MG Take 1 tablet by mouth 2 (two) times daily. 60 tablet 6   No current facility-administered medications  for this encounter.   BP 118/76    Pulse 81    Wt 88.8 kg (195 lb 12.8 oz)    SpO2 96%    BMI 35.81 kg/m    Wt Readings from Last 3 Encounters:  01/07/21 88.8 kg (195 lb 12.8 oz)  12/13/20 90 kg (198 lb 6.4 oz)  08/19/20 87.8 kg (193 lb 9.6 oz)   Exam:   BP 118/76    Pulse 81    Wt 88.8 kg (195 lb 12.8 oz)    SpO2 96%    BMI 35.81 kg/m  General:  NAD. No resp difficulty HEENT: Normal Neck: Supple. JVP 6-7. Carotids 2+ bilat; no bruits. No lymphadenopathy or thryomegaly appreciated. Cor: PMI nondisplaced. Regular rate & rhythm. No rubs, gallops, II/VI HSM apex Lungs: Clear Abdomen: Obese, soft, nontender, nondistended. No hepatosplenomegaly. No bruits or masses. Good bowel sounds. Extremities: No cyanosis, clubbing, rash, edema Neuro: alert & oriented x 3, cranial nerves grossly intact. Moves all 4 extremities w/o difficulty. Affect pleasant.  Assessment/Plan: 1. Chronic systolic CHF: Nonischemic cardiomyopathy.  EF up to 45% on 12/16, repeat echo 11/18 shows that EF remains about 45%.  Etiology of NICM still unclear. Had frequent PVCs by past holter (12%) but doesn't explain her cardiomyopathy. Had improvement of palpitations on Coreg. Cannot rule out myocarditis. May also have component of Takotsubo with passing of child but EF has not improved as completely as would be expected with Takotsubo. Have previously discussed cardiac MRI to look for infiltrative disease but she says that she would be too claustrophobic.  Echo in 11/19 showed EF down to 30-35% and mild to moderate RV systolic function.  There also appeared to be severe mitral regurgitation, which seemed progressive from the past. TEE in 11/19 confirmed severe MR with restricted posterior leaflet => Mitraclip 1/20 with decrease in MR to 1+.  Echo in 1/20 showed EF 30-35% with mild MR s/p Mitraclip. Echo in 9/20 with stable EF 30-35% but dilated IVC suggestive of volume overload.  Echo in 1/21 with EF 40-45%, normal RV.  Echo (1/22)  EF 40-45% with moderate LV dilation, normal RV size and systolic function, severe LAE, s/p Mitraclip with probably mild mitral stenosis (mean gradient 7, MVA 2.2 cm^2 by PHT), moderate MR. NYHA class II symptoms.  Her volume appears slightly elevated on exam, Reds 46%, weight is down 3 lbs.  - Increase lasix to 40 mg bid x 4 days, then back to 40 mg q AM/20  mg q PM daily. BMET today. - Encouraged her to weight daily and limit excess salt intake. - Continue Coreg 25 mg bid.  - Continue Entresto to 97/103 mg bid. - Continue empagliflozin 10 mg daily. No GU symptoms.     - She did not tolerate Bidil 1/2 tab tid.  - She is off spironolactone with hyperkalemia.   - If K rises again, she did not tolerate Veltassa, so would try Lokelma.  - EF now above ICD range.  2. PVCs: Frequent (12%) on 10/15 holter.   Pt refused repeat holter monitor for further assessment. PVC count was probably not high enough to cause cardiomyopathy, unlikely primary etiology. - Improved palpitations on Coreg.  Regular on exam today. No complaints of palpitations. 3. Pulmonary hypertension: Primarily pulmonary venous HTN (PVR 3.1) from elevated left atrial pressure (previous RHC above).   - Best treatment remains adequate diuresis. 4. Mitral regurgitation:  TEE in 11/19 with severe MR, restricted posterior leaflet.  She had Mitraclip in 1/20. Post-op echo showed mild MR and mild mitral stenosis.  Most recent echo in 1/22 showed moderate MR, mild MS though she still has a prominent murmur.  - Will need antibiotics for dental prophylaxis.  5. Goiter: TSH/fT3/fT4 (10/21) normal.  Followup in APP clinic 6 weeks and with Dr. Aundra Dubin in 3-4 months.  Signed, Rafael Bihari, FNP -[facility] 01/07/2021  [facility] [place] [address] [facility] and [place] [address] [phone] (office) [phone] (fax)

## 2021-01-07 ENCOUNTER — Other Ambulatory Visit: Payer: Self-pay

## 2021-01-07 ENCOUNTER — Ambulatory Visit (HOSPITAL_COMMUNITY)
Admission: RE | Admit: 2021-01-07 | Discharge: 2021-01-07 | Disposition: A | Discharge status: Home / Self Care | Payer: PPO | Source: Ambulatory Visit | Attending: Family Medicine | Admitting: Family Medicine

## 2021-01-07 ENCOUNTER — Encounter (HOSPITAL_COMMUNITY): Payer: Self-pay

## 2021-01-07 VITALS — BP 118/76 | HR 81 | Wt 195.8 lb

## 2021-01-07 DIAGNOSIS — I493 Ventricular premature depolarization: Secondary | ICD-10-CM | POA: Diagnosis not present

## 2021-01-07 DIAGNOSIS — Z9889 Other specified postprocedural states: Secondary | ICD-10-CM | POA: Diagnosis not present

## 2021-01-07 DIAGNOSIS — Z7982 Long term (current) use of aspirin: Secondary | ICD-10-CM | POA: Diagnosis not present

## 2021-01-07 DIAGNOSIS — R7989 Other specified abnormal findings of blood chemistry: Secondary | ICD-10-CM

## 2021-01-07 DIAGNOSIS — I272 Pulmonary hypertension, unspecified: Secondary | ICD-10-CM | POA: Diagnosis not present

## 2021-01-07 DIAGNOSIS — Z79899 Other long term (current) drug therapy: Secondary | ICD-10-CM | POA: Diagnosis not present

## 2021-01-07 DIAGNOSIS — I340 Nonrheumatic mitral (valve) insufficiency: Secondary | ICD-10-CM | POA: Insufficient documentation

## 2021-01-07 DIAGNOSIS — I5022 Chronic systolic (congestive) heart failure: Secondary | ICD-10-CM | POA: Diagnosis not present

## 2021-01-07 DIAGNOSIS — E049 Nontoxic goiter, unspecified: Secondary | ICD-10-CM | POA: Insufficient documentation

## 2021-01-07 DIAGNOSIS — I428 Other cardiomyopathies: Secondary | ICD-10-CM | POA: Diagnosis not present

## 2021-01-07 LAB — BASIC METABOLIC PANEL
Anion gap: 10 (ref 5–15)
BUN: 40 mg/dL — ABNORMAL HIGH (ref 8–23)
CO2: 26 mmol/L (ref 22–32)
Calcium: 9.1 mg/dL (ref 8.9–10.3)
Chloride: 106 mmol/L (ref 98–111)
Creatinine, Ser: 1.42 mg/dL — ABNORMAL HIGH (ref 0.44–1.00)
GFR, Estimated: 40 mL/min — ABNORMAL LOW (ref >60)
Glucose, Bld: 134 mg/dL — ABNORMAL HIGH (ref 70–99)
Potassium: 4.2 mmol/L (ref 3.5–5.1)
Sodium: 142 mmol/L (ref 135–145)

## 2021-01-07 NOTE — Progress Notes (Signed)
ReDS Vest / Clip - 01/07/21 0900      ReDS Vest / Clip   Station Marker A    Ruler Value 31    ReDS Value Range High volume overload    ReDS Actual Value 46

## 2021-01-07 NOTE — Patient Instructions (Addendum)
INCREASE Lasix to 40 mg twice a day for 4 days, then return to normal dose of 40 mg in the AM and 20 mg in the PM  Labs today We will only contact you if something comes back abnormal or we need to make some changes. Otherwise no news is good news!  Labs needed in 7-10 days  Your physician recommends that you schedule a follow-up appointment in: 2-3 months with Dr Aundra Dubin  If you have any questions or concerns before your next appointment please send [place] a message through [place] or call our office at [phone].    TO LEAVE A MESSAGE FOR THE NURSE SELECT OPTION 2, PLEASE LEAVE A MESSAGE INCLUDING: . YOUR NAME . DATE OF BIRTH . CALL BACK NUMBER . REASON FOR CALL**this is important as we prioritize the call backs  YOU WILL RECEIVE A CALL BACK THE SAME DAY AS LONG AS YOU CALL BEFORE 4:00 PM  Do the following things EVERYDAY: 1) Weigh yourself in the morning before breakfast. Write it down and keep it in a log. 2) Take your medicines as prescribed 3) Eat low salt foods--Limit salt (sodium) to 2000 mg per day.  4) Stay as active as you can everyday 5) Limit all fluids for the day to less than 2 liters   At the [facility], you and your health needs are our priority. As part of our continuing mission to provide you with exceptional heart care, we have created designated Provider Care Teams. These Care Teams include your primary Cardiologist (physician) and Advanced Practice Providers (APPs- Physician Assistants and Nurse Practitioners) who all work together to provide you with the care you need, when you need it.   You may see any of the following providers on your designated Care Team at your next follow up: Marland Kitchen Dr Glori Bickers . Dr [name] . Darrick Grinder, NP . Lyda Jester, [place] . Audry Riles, PharmD   Please be sure to bring in all your medications bottles to every appointment.

## 2021-01-15 DIAGNOSIS — M8588 Other specified disorders of bone density and structure, other site: Secondary | ICD-10-CM | POA: Diagnosis not present

## 2021-01-15 DIAGNOSIS — M79606 Pain in leg, unspecified: Secondary | ICD-10-CM | POA: Diagnosis not present

## 2021-01-17 ENCOUNTER — Ambulatory Visit (HOSPITAL_COMMUNITY)
Admission: RE | Admit: 2021-01-17 | Discharge: 2021-01-17 | Disposition: A | Discharge status: Home / Self Care | Payer: PPO | Source: Ambulatory Visit | Attending: Cardiology | Admitting: Cardiology

## 2021-01-17 ENCOUNTER — Other Ambulatory Visit: Payer: Self-pay

## 2021-01-17 DIAGNOSIS — I5022 Chronic systolic (congestive) heart failure: Secondary | ICD-10-CM | POA: Diagnosis not present

## 2021-01-17 LAB — BASIC METABOLIC PANEL
Anion gap: 9 (ref 5–15)
BUN: 47 mg/dL — ABNORMAL HIGH (ref 8–23)
CO2: 25 mmol/L (ref 22–32)
Calcium: 9.2 mg/dL (ref 8.9–10.3)
Chloride: 106 mmol/L (ref 98–111)
Creatinine, Ser: 1.79 mg/dL — ABNORMAL HIGH (ref 0.44–1.00)
GFR, Estimated: 30 mL/min — ABNORMAL LOW (ref >60)
Glucose, Bld: 112 mg/dL — ABNORMAL HIGH (ref 70–99)
Potassium: 4.7 mmol/L (ref 3.5–5.1)
Sodium: 140 mmol/L (ref 135–145)

## 2021-01-20 ENCOUNTER — Telehealth (HOSPITAL_COMMUNITY): Payer: Self-pay

## 2021-01-20 DIAGNOSIS — I5022 Chronic systolic (congestive) heart failure: Secondary | ICD-10-CM

## 2021-01-20 NOTE — Telephone Encounter (Signed)
Malena Edman, RN  01/20/2021 8:49 AM EST Back to Top     Patient advised and verbalized understanding. Lab appointment scheduled and lab orders placed

## 2021-01-20 NOTE — Telephone Encounter (Signed)
-----   Message from Rafael Bihari,  sent at 01/17/2021  4:41 PM EST ----- Kidney function elevated as expected in setting of increasing diuretics for volume last week. Please repeat BMET in 7-10 days.

## 2021-01-24 ENCOUNTER — Other Ambulatory Visit: Payer: Self-pay

## 2021-01-24 ENCOUNTER — Ambulatory Visit (HOSPITAL_COMMUNITY)
Admission: RE | Admit: 2021-01-24 | Discharge: 2021-01-24 | Disposition: A | Discharge status: Home / Self Care | Payer: PPO | Source: Ambulatory Visit | Attending: Internal Medicine | Admitting: Internal Medicine

## 2021-01-24 DIAGNOSIS — I5022 Chronic systolic (congestive) heart failure: Secondary | ICD-10-CM | POA: Diagnosis not present

## 2021-01-24 LAB — BASIC METABOLIC PANEL
Anion gap: 10 (ref 5–15)
BUN: 34 mg/dL — ABNORMAL HIGH (ref 8–23)
CO2: 25 mmol/L (ref 22–32)
Calcium: 9.3 mg/dL (ref 8.9–10.3)
Chloride: 107 mmol/L (ref 98–111)
Creatinine, Ser: 1.52 mg/dL — ABNORMAL HIGH (ref 0.44–1.00)
GFR, Estimated: 37 mL/min — ABNORMAL LOW (ref >60)
Glucose, Bld: 139 mg/dL — ABNORMAL HIGH (ref 70–99)
Potassium: 4.8 mmol/L (ref 3.5–5.1)
Sodium: 142 mmol/L (ref 135–145)

## 2021-01-27 ENCOUNTER — Telehealth (HOSPITAL_COMMUNITY): Payer: Self-pay

## 2021-01-27 DIAGNOSIS — I5022 Chronic systolic (congestive) heart failure: Secondary | ICD-10-CM

## 2021-01-27 NOTE — Telephone Encounter (Signed)
-----   Message from Rafael Bihari, [place] sent at 01/24/2021 10:08 AM EST ----- Kidney function better. Potassium high/normal.   Will repeat BMET in 2 weeks, may need Lokelma if continues to rise (has not tolerated Veltassa in the past). Please make sure she is not taking KCL supplements.

## 2021-01-27 NOTE — Telephone Encounter (Signed)
Malena Edman, RN  01/27/2021 9:08 AM EST Back to Top     Patient advised and verbalized understanding. Pt denies taking Kcl supplements. Lab appt scheduled and lab orders placed   Malena Edman, RN  01/24/2021 10:22 AM EST      Left message to return call

## 2021-02-10 ENCOUNTER — Other Ambulatory Visit (HOSPITAL_COMMUNITY): Payer: Self-pay | Admitting: *Deleted

## 2021-02-10 MED ORDER — FUROSEMIDE 40 MG PO TABS: ORAL_TABLET | ORAL | 3 refills | Status: DC

## 2021-02-12 ENCOUNTER — Other Ambulatory Visit: Payer: Self-pay

## 2021-02-12 ENCOUNTER — Ambulatory Visit (HOSPITAL_COMMUNITY)
Admission: RE | Admit: 2021-02-12 | Discharge: 2021-02-12 | Disposition: A | Discharge status: Home / Self Care | Payer: PPO | Source: Ambulatory Visit | Attending: Internal Medicine | Admitting: Internal Medicine

## 2021-02-12 DIAGNOSIS — I5022 Chronic systolic (congestive) heart failure: Secondary | ICD-10-CM | POA: Diagnosis not present

## 2021-02-12 LAB — BASIC METABOLIC PANEL
Anion gap: 5 (ref 5–15)
BUN: 28 mg/dL — ABNORMAL HIGH (ref 8–23)
CO2: 27 mmol/L (ref 22–32)
Calcium: 8.7 mg/dL — ABNORMAL LOW (ref 8.9–10.3)
Chloride: 109 mmol/L (ref 98–111)
Creatinine, Ser: 1.44 mg/dL — ABNORMAL HIGH (ref 0.44–1.00)
GFR, Estimated: 39 mL/min — ABNORMAL LOW (ref >60)
Glucose, Bld: 108 mg/dL — ABNORMAL HIGH (ref 70–99)
Potassium: 4.7 mmol/L (ref 3.5–5.1)
Sodium: 141 mmol/L (ref 135–145)

## 2021-03-04 ENCOUNTER — Encounter (HOSPITAL_COMMUNITY): Payer: PPO | Admitting: Cardiology

## 2021-03-04 DIAGNOSIS — H25013 Cortical age-related cataract, bilateral: Secondary | ICD-10-CM | POA: Diagnosis not present

## 2021-03-04 DIAGNOSIS — H5203 Hypermetropia, bilateral: Secondary | ICD-10-CM | POA: Diagnosis not present

## 2021-03-11 ENCOUNTER — Other Ambulatory Visit: Payer: Self-pay

## 2021-03-11 ENCOUNTER — Ambulatory Visit (HOSPITAL_COMMUNITY)
Admission: RE | Admit: 2021-03-11 | Discharge: 2021-03-11 | Disposition: A | Discharge status: Home / Self Care | Payer: PPO | Source: Ambulatory Visit | Attending: Cardiology | Admitting: Cardiology

## 2021-03-11 ENCOUNTER — Encounter (HOSPITAL_COMMUNITY): Payer: Self-pay | Admitting: Cardiology

## 2021-03-11 VITALS — BP 124/72 | HR 69 | Wt 197.8 lb

## 2021-03-11 DIAGNOSIS — I493 Ventricular premature depolarization: Secondary | ICD-10-CM | POA: Insufficient documentation

## 2021-03-11 DIAGNOSIS — I272 Pulmonary hypertension, unspecified: Secondary | ICD-10-CM | POA: Insufficient documentation

## 2021-03-11 DIAGNOSIS — I130 Hypertensive heart and chronic kidney disease with heart failure and stage 1 through stage 4 chronic kidney disease, or unspecified chronic kidney disease: Secondary | ICD-10-CM | POA: Insufficient documentation

## 2021-03-11 DIAGNOSIS — N183 Chronic kidney disease, stage 3 unspecified: Secondary | ICD-10-CM | POA: Diagnosis not present

## 2021-03-11 DIAGNOSIS — I052 Rheumatic mitral stenosis with insufficiency: Secondary | ICD-10-CM | POA: Diagnosis not present

## 2021-03-11 DIAGNOSIS — Z79899 Other long term (current) drug therapy: Secondary | ICD-10-CM | POA: Diagnosis not present

## 2021-03-11 DIAGNOSIS — Z7982 Long term (current) use of aspirin: Secondary | ICD-10-CM | POA: Insufficient documentation

## 2021-03-11 DIAGNOSIS — I428 Other cardiomyopathies: Secondary | ICD-10-CM | POA: Diagnosis not present

## 2021-03-11 DIAGNOSIS — I5022 Chronic systolic (congestive) heart failure: Secondary | ICD-10-CM | POA: Diagnosis not present

## 2021-03-11 LAB — BASIC METABOLIC PANEL
Anion gap: 7 (ref 5–15)
BUN: 24 mg/dL — ABNORMAL HIGH (ref 8–23)
CO2: 26 mmol/L (ref 22–32)
Calcium: 9.1 mg/dL (ref 8.9–10.3)
Chloride: 108 mmol/L (ref 98–111)
Creatinine, Ser: 1.25 mg/dL — ABNORMAL HIGH (ref 0.44–1.00)
GFR, Estimated: 47 mL/min — ABNORMAL LOW (ref >60)
Glucose, Bld: 113 mg/dL — ABNORMAL HIGH (ref 70–99)
Potassium: 4.5 mmol/L (ref 3.5–5.1)
Sodium: 141 mmol/L (ref 135–145)

## 2021-03-11 MED ORDER — ENTRESTO 97-103 MG PO TABS: 1.0000 | ORAL_TABLET | Freq: Two times a day (BID) | ORAL | 6 refills | Status: DC

## 2021-03-11 MED ORDER — EMPAGLIFLOZIN 10 MG PO TABS: 10.0000 mg | ORAL_TABLET | Freq: Every day | ORAL | 3 refills | Status: DC

## 2021-03-11 MED ORDER — TORSEMIDE 20 MG PO TABS: 40.0000 mg | ORAL_TABLET | Freq: Every day | ORAL | 3 refills | Status: DC

## 2021-03-11 NOTE — Progress Notes (Signed)
ID:  Vanessa [name], DOB Apr 18, 1951, MRN [id number]   Provider location:  Advanced Heart Failure Type of Visit: Established patient   PCP:  Lennie Odor, PA  Cardiologist:   Dr. Aundra Dubin   History of Present Illness: Vanessa [name] is a 70 y.o. with history of nonischemic cardiomyopathy and presents for followup of CHF. Patient has had a low EF recognized since at least 9/14, when echo showed EF 40-45%.  In 2023/06/30, her child died, which led to significant stress.  She developed worsening exertional dyspnea and subsequent echo showed a fall in EF to 25-30%.  She had left and right heart catheterization in 3/16 with no significant coronary disease detected and moderately elevated right and left heart filling pressures along with pulmonary venous hypertension.  Echo was done 12/16: EF 45%, diffuse hypokinesis. She had a repeat echo 11/17 => EF 45% with moderately dilated LV, moderate MR.  Echo 11/18 showed EF stable at 45% with mild LV dilation and mildly decreased RV systolic function, moderate MR.   Spironolactone was stopped due to elevated K. She apparently did not tolerate Veltassa well.   Echo was done in 11/19.  EF 30-35% with mild LV hypertrophy, mild-moderately decreased RV systolic function, possible severe MR with restricted posterior leaflet, PASP 50 mmHg, severe LAE.  TEE was done in 11/19 to assess mitral regurgitation.  This showed severe MR with restricted posterior leaflet.   In 1/20, she had Mitraclip with reduction in MR to 1+.    She had echo in 9/20 with EF 30-35%, severe LAE, normal RV, s/p Mitraclip with mild-moderate MR and mild MS.  The IVC was dilated, suggesting volume overload.   She was unable to tolerate Bidil due to lightheadedness.   Echo in 1/21 showed EF 40-45%, RV normal, biatrial dilation, mild mitral stenosis mean 5 mmHg, mild-moderate MR (s/p Mitraclip).  Echo in 1/22 showed EF 40-45% with moderate LV dilation, normal RV size and systolic  function, severe LAE, s/p Mitraclip with probably mild mitral stenosis (mean gradient 7, MVA 2.2 cm^2 by PHT), moderate MR.   Weight is up 2 lbs since last appointment.  She is short of breath with brisk walking, very short of breath with stairs.  She does ok if walking slowly on flat ground.  No orthopnea/PND.    Labs (7/16): K 4.5, creatinine 1.12, BNP 596 Labs (8/16): K 4 => 5.1, creatinine 1.29 => 1.5, BNP 388 => 426 Labs (9/16): K 4, creatinine 1.08, BNP 502 Labs (12/16): K 4.9, creatinine 1.57 Labs (5/17): K 5.2, creatinine 1.3 Labs (8/17): BNP 152 Labs (11/17): K 4.9, creatinine 1.32, BNP 236 Labs (8/18): K 4.6, creatinine 1.57 Labs (11/18): K 3.8, creatinine 1.29 Labs (2/19): K 5.1, creatinine 1.44 Labs (9/19): K 3.8, creatinine 1.08 Labs (11/19): K 4.3, creatinine 1.3 Labs (1/20): K 4.8, creatinine 1.68 Labs (3/20): K 4.4, creatinine 1.5 Labs (9/20): K 4.4, creatinine 1.77 => 1.48, LDL 109 Labs (10/20): K 4.6, creatinine 1.66 Labs (12/20): K 4.7, creatinine 1.59 Labs (3/21): K 4.5, creatinine 1.73 Labs (6/21): K 4.6, creatinine 1.7 Labs (10/21): TSH normal, K 4.2, creatinine 1.37 Labs (3/22): K 4.7, creatinine 1.44  PMH: 1. Cardiomyopathy: Nonischemic.  Echo (9/14) with EF 40-45%.  Echo (2/16) with EF 25-30%.  Echo (4/16) with EF 30-35%, moderate MR.  Echo (6/16) with EF 40%, moderate LV dilation, moderate diastolic dysfunction, moderate MR, moderate TR, PA systolic pressure 52 mmHg. RHC/LHC (3/16) with no significant CAD, EF  20-25%, 3+ MR; mean RA 13, PA 70/30 mean 45, mean PCWP 31 mmHg, CI 2.2, PVR 3.1 WU.  Echo (12/16) with EF 45%, diffuse hypokinesis, moderate MR, normal RV size and systolic function.  - Echo (11/17): EF 45%, moderately dilated LV, moderate eccentric posterior MR, PASP 42 mmHg, normal RV size and systolic function.  - Echo (11/18): EF 45%, mild LV dilation, moderate diastolic dysfunction, normal RV size with mildly decreased systolic function, PASP 50 mmHg,  moderate MR.  - Echo (11/19): EF 30-35%, mild LV dilation, severe LAE, mild-moderately decreased RV systolic function, possible severe MR with restricted posterior leaflet, PASP 50 mmHg.  - Echo (1/20): EF 30-35%, severe LV dilation, s/p Mitraclip with mild MR and mild MS, PASP 48 mmHg.  - RHC (1/20, with Mitraclip): mean RA 10, mean LA 18 - Echo (2/20): EF 30-35%, s/p mitraclip with mild mitral stenosis, mean MV gradient 5 mmHg, moderate MR, PASP 56 mmHg.  - Echo (9/20): EF 30-35%, severe LAE, normal RV, s/p Mitraclip with mild-moderate MR and mild MS.  The IVC was dilated, suggesting volume overload.  - Echo (1/21): EF 40-45%, RV normal, biatrial dilation, mild mitral stenosis mean 5 mmHg, mild-moderate MR (s/p Mitraclip).  - Echo (1/22): EF 40-45% with moderate LV dilation, normal RV size and systolic function, severe LAE, s/p Mitraclip with probably mild mitral stenosis (mean gradient 7, MVA 2.2 cm^2 by PHT), moderate MR. 2. PVCs: Holter (10/15) with 12% PVCs. 3. Pulmonary venous hypertension: CTA chest (7/16) negative for PE. PFTs (7/16) with minimal obstruction, mild restriction, moderately decreased DLCO.  4. Thyroid nodules: Biopsy benign.  5. CKD stage 3 6. Mitral regurgitation: TEE (11/19) with EF 30-35%, moderate LV dilation, severe MR with posterior leaflet restriction and ERO 0.41 cm^2, no mitral stenosis, mild to moderately decreased RV systolic function, peak RV-RA gradient 50 mmHg.  - Mitraclip placement x 2 in 1/20 with 1+ residual MR.  - Echo (2/20) with moderate residual MR, mild MS with mean gradient 5 mmHg.  - Echo (9/20) with mild-moderate MR, mild MS.  - Echo (1/21) with mild-moderate MR, mild MS s/p Mitraclip - Echo (1/22) with moderate residual MR, mild-moderate mitral stenosis.   SH: Lives alone, only child passed away in 03-Jul-2023, nonsmoker, no ETOH or drugs.   FH: Father with MIs  ROS: All systems reviewed and negative except as per HPI.   Current Outpatient  Medications  Medication Sig Dispense Refill  . amoxicillin (AMOXIL) 500 MG tablet Take 4 tablets by mouth one hour prior to dental appointments 8 tablet 11  . aspirin 81 MG EC tablet Take 1 tablet (81 mg total) by mouth daily.    . Calcium Carbonate-Vitamin D (CALCIUM 600+D PO) Take 1 tablet by mouth daily.    . carvedilol (COREG) 25 MG tablet Take 1 tablet (25 mg total) by mouth 2 (two) times daily with a meal. 180 tablet 2  . ibandronate (BONIVA) 150 MG tablet Take 150 mg by mouth every 30 (thirty) days. Take in the morning with a full glass of water, on an empty stomach, and do not take anything else by mouth or lie down for the next 30 min.    . torsemide (DEMADEX) 20 MG tablet Take 2 tablets (40 mg total) by mouth daily. 35 tablet 3  . empagliflozin (JARDIANCE) 10 MG TABS tablet Take 1 tablet (10 mg total) by mouth daily before breakfast. 90 tablet 3  . sacubitril-valsartan (ENTRESTO) 97-103 MG Take 1 tablet by mouth 2 (two)  times daily. 60 tablet 6   No current facility-administered medications for this encounter.   BP 124/72   Pulse 69   Wt 89.7 kg (197 lb 12.8 oz)   SpO2 97%   BMI 36.18 kg/m    Wt Readings from Last 3 Encounters:  03/11/21 89.7 kg (197 lb 12.8 oz)  01/07/21 88.8 kg (195 lb 12.8 oz)  12/13/20 90 kg (198 lb 6.4 oz)   Exam:   General: NAD Neck: JVP 8-9 cm with HJR, no thyromegaly or thyroid nodule.  Lungs: Clear to auscultation bilaterally with normal respiratory effort. CV: Nondisplaced PMI.  Heart regular S1/S2, no S3/S4, 3/6 HSM apex.  1+ ankle edema.  No carotid bruit.  Normal pedal pulses.  Abdomen: Soft, nontender, no hepatosplenomegaly, no distention.  Skin: Intact without lesions or rashes.  Neurologic: Alert and oriented x 3.  Psych: Normal affect. Extremities: No clubbing or cyanosis.  HEENT: Normal.    Assessment/Plan: 1. Chronic systolic CHF: Nonischemic cardiomyopathy.  EF up to 45% on 12/16, repeat echo 11/18 shows that EF remains about 45%.   Etiology of NICM still unclear. Had frequent PVCs by past holter (12%) but doesn't explain her cardiomyopathy. Had improvement of palpitations on Coreg. Cannot rule out myocarditis. May also have component of Takotsubo with passing of child but EF has not improved as completely as would be expected with Takotsubo. Have previously discussed cardiac MRI to look for infiltrative disease but she says that she would be too claustrophobic.  Echo in 11/19 showed EF down to 30-35% and mild to moderate RV systolic function.  There also appeared to be severe mitral regurgitation, which seemed progressive from the past. TEE in 11/19 confirmed severe MR with restricted posterior leaflet => Mitraclip 1/20 with decrease in MR to 1+.  Echo in 1/20 showed EF 30-35% with mild MR s/p Mitraclip. Echo in 9/20 with stable EF 30-35% but dilated IVC suggestive of volume overload.  Echo in 1/22 with EF 40-45%, normal RV.  NYHA class III symptoms, weight up, volume overloaded on exam.    - Stop Lasix, start torsemide 40 qam/20 qpm x 5 days then 40 mg daily after that.  BMET today and in 10 days.   - She is off spironolactone with hyperkalemia.   - Continue Coreg 25 mg bid.  - Continue Entresto 97/103 bid.   - She did not tolerate Bidil 1/2 tab tid.  - If K rises again, she did not tolerate Veltassa so would try Lokelma.  - Continue empagliflozin 10 mg daily.      - EF now above ICD range.   2. PVCs: Frequent (12%) on 10/15 holter.   Pt refused repeat holter monitor for further assessment. PVC count was probably not high enough to cause cardiomyopathy, unlikely primary etiology. - Improved palpitations on Coreg.   3. Pulmonary hypertension: Primarily pulmonary venous HTN (PVR 3.1) from elevated left atrial pressure (previous RHC above).   - Best treatment remains adequate diuresis. 4. Mitral regurgitation:  TEE in 11/19 with severe MR, restricted posterior leaflet.  She had Mitraclip in 1/20. Post-op echo showed mild MR and mild  mitral stenosis.  Echo in 1/22 showed moderate MR, mild-moderate MS. - Will need antibiotics for dental prophylaxis.   Recommended follow-up:  3 wks with APP.    Signed, [name], MD  03/11/2021  [facility] [address] [facility] [place] [address] [phone] (office) [phone] (fax)

## 2021-03-11 NOTE — Patient Instructions (Signed)
Stop Furosemide  Start Torsemide 40 mg (2 tabs) in AM and 20 mg (1 tab) in PM FOR 5 DAYS ONLY, then take 40 mg (2 tabs) Daily  Labs done today, we will call you for abnormal results  Your physician recommends that you return for lab work in: 1-2 weeks  Your physician recommends that you schedule a follow-up appointment in: 1 month  If you have any questions or concerns before your next appointment please send [place] a message through [place] or call our office at [phone].    TO LEAVE A MESSAGE FOR THE NURSE SELECT OPTION 2, PLEASE LEAVE A MESSAGE INCLUDING: . YOUR NAME . DATE OF BIRTH . CALL BACK NUMBER . REASON FOR CALL**this is important as we prioritize the call backs  [place] AS LONG AS YOU CALL BEFORE 4:00 PM  At the [facility], you and your health needs are our priority. As part of our continuing mission to provide you with exceptional heart care, we have created designated Provider Care Teams. These Care Teams include your primary Cardiologist (physician) and Advanced Practice Providers (APPs- Physician Assistants and Nurse Practitioners) who all work together to provide you with the care you need, when you need it.   You may see any of the following providers on your designated Care Team at your next follow up: Marland Kitchen Dr Glori Bickers . Dr [name] . Dr Vickki Muff . Darrick Grinder, NP . Lyda Jester, [place] . Audry Riles, PharmD   Please be sure to bring in all your medications bottles to every appointment.

## 2021-03-20 ENCOUNTER — Other Ambulatory Visit: Payer: Self-pay

## 2021-03-20 ENCOUNTER — Ambulatory Visit (HOSPITAL_COMMUNITY)
Admission: RE | Admit: 2021-03-20 | Discharge: 2021-03-20 | Disposition: A | Discharge status: Home / Self Care | Payer: PPO | Source: Ambulatory Visit | Attending: Cardiology | Admitting: Cardiology

## 2021-03-20 DIAGNOSIS — I5022 Chronic systolic (congestive) heart failure: Secondary | ICD-10-CM | POA: Insufficient documentation

## 2021-03-20 LAB — BASIC METABOLIC PANEL
Anion gap: 9 (ref 5–15)
BUN: 54 mg/dL — ABNORMAL HIGH (ref 8–23)
CO2: 28 mmol/L (ref 22–32)
Calcium: 8.7 mg/dL — ABNORMAL LOW (ref 8.9–10.3)
Chloride: 106 mmol/L (ref 98–111)
Creatinine, Ser: 1.52 mg/dL — ABNORMAL HIGH (ref 0.44–1.00)
GFR, Estimated: 37 mL/min — ABNORMAL LOW (ref >60)
Glucose, Bld: 150 mg/dL — ABNORMAL HIGH (ref 70–99)
Potassium: 4.2 mmol/L (ref 3.5–5.1)
Sodium: 143 mmol/L (ref 135–145)

## 2021-03-24 ENCOUNTER — Telehealth (HOSPITAL_COMMUNITY): Payer: Self-pay | Admitting: *Deleted

## 2021-03-24 MED ORDER — TORSEMIDE 20 MG PO TABS: ORAL_TABLET | ORAL | 3 refills | Status: DC

## 2021-03-24 NOTE — Telephone Encounter (Signed)
-----   Message from Larey Dresser, MD sent at 03/20/2021  9:10 PM EDT ----- Decrease torsemide to 40 daily alternating with 20 mg daily.

## 2021-03-24 NOTE — Telephone Encounter (Signed)
Vanessa [name], [place]  03/24/2021 3:50 PM EDT Back to Top     Pt returned call she is aware of results and med change.    Shonna Chock, CMA  03/21/2021 4:29 PM EDT      [facility]   Larey Dresser, MD  03/20/2021 9:10 PM EDT      Decrease torsemide to 40 daily alternating with 20 mg daily.

## 2021-04-10 ENCOUNTER — Other Ambulatory Visit: Payer: Self-pay

## 2021-04-10 ENCOUNTER — Encounter (HOSPITAL_COMMUNITY): Payer: Self-pay

## 2021-04-10 ENCOUNTER — Ambulatory Visit (HOSPITAL_COMMUNITY)
Admission: RE | Admit: 2021-04-10 | Discharge: 2021-04-10 | Disposition: A | Discharge status: Home / Self Care | Payer: PPO | Source: Ambulatory Visit | Attending: Cardiology | Admitting: Cardiology

## 2021-04-10 VITALS — BP 118/70 | HR 67 | Wt 187.2 lb

## 2021-04-10 DIAGNOSIS — I272 Pulmonary hypertension, unspecified: Secondary | ICD-10-CM | POA: Diagnosis not present

## 2021-04-10 DIAGNOSIS — N183 Chronic kidney disease, stage 3 unspecified: Secondary | ICD-10-CM | POA: Diagnosis not present

## 2021-04-10 DIAGNOSIS — Z7982 Long term (current) use of aspirin: Secondary | ICD-10-CM | POA: Insufficient documentation

## 2021-04-10 DIAGNOSIS — Z79899 Other long term (current) drug therapy: Secondary | ICD-10-CM | POA: Insufficient documentation

## 2021-04-10 DIAGNOSIS — I130 Hypertensive heart and chronic kidney disease with heart failure and stage 1 through stage 4 chronic kidney disease, or unspecified chronic kidney disease: Secondary | ICD-10-CM | POA: Diagnosis not present

## 2021-04-10 DIAGNOSIS — I5022 Chronic systolic (congestive) heart failure: Secondary | ICD-10-CM | POA: Diagnosis not present

## 2021-04-10 DIAGNOSIS — Z7984 Long term (current) use of oral hypoglycemic drugs: Secondary | ICD-10-CM | POA: Diagnosis not present

## 2021-04-10 DIAGNOSIS — I493 Ventricular premature depolarization: Secondary | ICD-10-CM | POA: Insufficient documentation

## 2021-04-10 DIAGNOSIS — I428 Other cardiomyopathies: Secondary | ICD-10-CM | POA: Diagnosis not present

## 2021-04-10 DIAGNOSIS — I052 Rheumatic mitral stenosis with insufficiency: Secondary | ICD-10-CM | POA: Diagnosis not present

## 2021-04-10 LAB — BASIC METABOLIC PANEL
Anion gap: 9 (ref 5–15)
BUN: 59 mg/dL — ABNORMAL HIGH (ref 8–23)
CO2: 30 mmol/L (ref 22–32)
Calcium: 9.2 mg/dL (ref 8.9–10.3)
Chloride: 101 mmol/L (ref 98–111)
Creatinine, Ser: 1.75 mg/dL — ABNORMAL HIGH (ref 0.44–1.00)
GFR, Estimated: 31 mL/min — ABNORMAL LOW (ref >60)
Glucose, Bld: 121 mg/dL — ABNORMAL HIGH (ref 70–99)
Potassium: 4.2 mmol/L (ref 3.5–5.1)
Sodium: 140 mmol/L (ref 135–145)

## 2021-04-10 NOTE — Patient Instructions (Signed)
Labwork today.  We will contact you with anything abnormal.  Dr. Aundra Dubin in 3-4 months.

## 2021-04-10 NOTE — Progress Notes (Signed)
ID:  [name], DOB Nov 19, 1951, MRN [id number]   Provider location: [place] [facility] Type of Visit: Established patient   PCP:  Lennie Odor, PA  Cardiologist:   Dr. Aundra Dubin   History of Present Illness: Vanessa [name] is a 70 y.o. with history of nonischemic cardiomyopathy and presents for followup of CHF. Patient has had a low EF recognized since at least 9/14, when echo showed EF 40-45%.  In 07-14-23, her child died, which led to significant stress.  She developed worsening exertional dyspnea and subsequent echo showed a fall in EF to 25-30%.  She had left and right heart catheterization in 3/16 with no significant coronary disease detected and moderately elevated right and left heart filling pressures along with pulmonary venous hypertension.  Echo was done 12/16: EF 45%, diffuse hypokinesis. She had a repeat echo 11/17 => EF 45% with moderately dilated LV, moderate MR.  Echo 11/18 showed EF stable at 45% with mild LV dilation and mildly decreased RV systolic function, moderate MR.   Spironolactone was stopped due to elevated K. She apparently did not tolerate Veltassa well.   Echo was done in 11/19.  EF 30-35% with mild LV hypertrophy, mild-moderately decreased RV systolic function, possible severe MR with restricted posterior leaflet, PASP 50 mmHg, severe LAE.  TEE was done in 11/19 to assess mitral regurgitation.  This showed severe MR with restricted posterior leaflet.    In 1/20, she had Mitraclip with reduction in MR to 1+.    She had echo in 9/20 with EF 30-35%, severe LAE, normal RV, s/p Mitraclip with mild-moderate MR and mild MS.  The IVC was dilated, suggesting volume overload.   She was unable to tolerate Bidil due to lightheadedness.   Echo in 1/21 showed EF 40-45%, RV normal, biatrial dilation, mild mitral stenosis mean 5 mmHg, mild-moderate MR (s/p Mitraclip).  Echo in 1/22 showed EF 40-45% with moderate LV dilation, normal RV size and systolic  function, severe LAE, s/p Mitraclip with probably mild mitral stenosis (mean gradient 7, MVA 2.2 cm^2 by PHT), moderate MR.   Had recent f/u w/ Dr. Aundra Dubin 4/22 and was fluid overloaded. Wt was up 2 lb and she endorsed increase in exertional dyspnea.  Lasix was stopped and she was placed on torsemide 40 qam/20 qpm x 5 days then 40 mg daily after that. She had return f/u for BMP on 4/22 showing slight bump in SCr Vanessa [name]. SCr 1.3>>1.6. BUN 24>>54. She was instructed to reduce torsemide to alternating between 40 mg and 20 mg daily.   She presents back today for f/u. Her wt is down 10 lb from 197>>187 lb. Breathing improved/ less SOB w/ activity. BP well controlled. Denies fatigue. No dizziness.     Labs (7/16): K 4.5, creatinine 1.12, BNP 596 Labs (8/16): K 4 => 5.1, creatinine 1.29 => 1.5, BNP 388 => 426 Labs (9/16): K 4, creatinine 1.08, BNP 502 Labs (12/16): K 4.9, creatinine 1.57 Labs (5/17): K 5.2, creatinine 1.3 Labs (8/17): BNP 152 Labs (11/17): K 4.9, creatinine 1.32, BNP 236 Labs (8/18): K 4.6, creatinine 1.57 Labs (11/18): K 3.8, creatinine 1.29 Labs (2/19): K 5.1, creatinine 1.44 Labs (9/19): K 3.8, creatinine 1.08 Labs (11/19): K 4.3, creatinine 1.3 Labs (1/20): K 4.8, creatinine 1.68 Labs (3/20): K 4.4, creatinine 1.5 Labs (9/20): K 4.4, creatinine 1.77 => 1.48, LDL 109 Labs (10/20): K 4.6, creatinine 1.66 Labs (12/20): K 4.7, creatinine 1.59 Labs (3/21): K 4.5, creatinine  1.73 Labs (6/21): K 4.6, creatinine 1.7 Labs (10/21): TSH normal, K 4.2, creatinine 1.37 Labs (3/22): K 4.7, creatinine 1.44 Labs (4/22): creatinine 1.52, K 4.2   PMH: 1. Cardiomyopathy: Nonischemic.  Echo (9/14) with EF 40-45%.  Echo (2/16) with EF 25-30%.  Echo (4/16) with EF 30-35%, moderate MR.  Echo (6/16) with EF 40%, moderate LV dilation, moderate diastolic dysfunction, moderate MR, moderate TR, PA systolic pressure 52 mmHg. RHC/LHC (3/16) with no significant CAD, EF 20-25%, 3+ MR; mean RA 13, PA 70/30  mean 45, mean PCWP 31 mmHg, CI 2.2, PVR 3.1 WU.  Echo (12/16) with EF 45%, diffuse hypokinesis, moderate MR, normal RV size and systolic function.  - Echo (11/17): EF 45%, moderately dilated LV, moderate eccentric posterior MR, PASP 42 mmHg, normal RV size and systolic function.  - Echo (11/18): EF 45%, mild LV dilation, moderate diastolic dysfunction, normal RV size with mildly decreased systolic function, PASP 50 mmHg, moderate MR.  - Echo (11/19): EF 30-35%, mild LV dilation, severe LAE, mild-moderately decreased RV systolic function, possible severe MR with restricted posterior leaflet, PASP 50 mmHg.  - Echo (1/20): EF 30-35%, severe LV dilation, s/p Mitraclip with mild MR and mild MS, PASP 48 mmHg.  - RHC (1/20, with Mitraclip): mean RA 10, mean LA 18 - Echo (2/20): EF 30-35%, s/p mitraclip with mild mitral stenosis, mean MV gradient 5 mmHg, moderate MR, PASP 56 mmHg.  - Echo (9/20): EF 30-35%, severe LAE, normal RV, s/p Mitraclip with mild-moderate MR and mild MS.  The IVC was dilated, suggesting volume overload.  - Echo (1/21): EF 40-45%, RV normal, biatrial dilation, mild mitral stenosis mean 5 mmHg, mild-moderate MR (s/p Mitraclip).  - Echo (1/22): EF 40-45% with moderate LV dilation, normal RV size and systolic function, severe LAE, s/p Mitraclip with probably mild mitral stenosis (mean gradient 7, MVA 2.2 cm^2 by PHT), moderate MR. 2. PVCs: Holter (10/15) with 12% PVCs. 3. Pulmonary venous hypertension: CTA chest (7/16) negative for PE. PFTs (7/16) with minimal obstruction, mild restriction, moderately decreased DLCO.  4. Thyroid nodules: Biopsy benign.  5. CKD stage 3 6. Mitral regurgitation: TEE (11/19) with EF 30-35%, moderate LV dilation, severe MR with posterior leaflet restriction and ERO 0.41 cm^2, no mitral stenosis, mild to moderately decreased RV systolic function, peak RV-RA gradient 50 mmHg.  - Mitraclip placement x 2 in 1/20 with 1+ residual MR.  - Echo (2/20) with moderate  residual MR, mild MS with mean gradient 5 mmHg.  - Echo (9/20) with mild-moderate MR, mild MS.  - Echo (1/21) with mild-moderate MR, mild MS s/p Mitraclip - Echo (1/22) with moderate residual MR, mild-moderate mitral stenosis.   SH: Lives alone, only child passed away in 06/24/23, nonsmoker, no ETOH or drugs.   FH: Father with MIs  ROS: All systems reviewed and negative except as per HPI.   Current Outpatient Medications  Medication Sig Dispense Refill  . amoxicillin (AMOXIL) 500 MG tablet Take 4 tablets by mouth one hour prior to dental appointments 8 tablet 11  . aspirin 81 MG EC tablet Take 1 tablet (81 mg total) by mouth daily.    . Calcium Carbonate-Vitamin D (CALCIUM 600+D PO) Take 1 tablet by mouth daily.    . carvedilol (COREG) 25 MG tablet Take 1 tablet (25 mg total) by mouth 2 (two) times daily with a meal. 180 tablet 2  . empagliflozin (JARDIANCE) 10 MG TABS tablet Take 1 tablet (10 mg total) by mouth daily before breakfast. 90 tablet  3  . ibandronate (BONIVA) 150 MG tablet Take 150 mg by mouth every 30 (thirty) days. Take in the morning with a full glass of water, on an empty stomach, and do not take anything else by mouth or lie down for the next 30 min.    . sacubitril-valsartan (ENTRESTO) 97-103 MG Take 1 tablet by mouth 2 (two) times daily. 60 tablet 6  . torsemide (DEMADEX) 20 MG tablet Taking '40mg'$  every other day alternating with '20mg'$  every other day. 35 tablet 3   No current facility-administered medications for this encounter.   BP 118/70   Pulse 67   Wt 84.9 kg (187 lb 3.2 oz)   SpO2 98%   BMI 34.24 kg/m    Wt Readings from Last 3 Encounters:  04/10/21 84.9 kg (187 lb 3.2 oz)  03/11/21 89.7 kg (197 lb 12.8 oz)  01/07/21 88.8 kg (195 lb 12.8 oz)   PHYSICAL EXAM: General:  Well appearing. No respiratory difficulty HEENT: normal Neck: supple. no JVD. Carotids 2+ bilat; no bruits. No lymphadenopathy or thyromegaly appreciated. Cor: PMI nondisplaced. Regular rate &  rhythm. 2/6 MR murmur  Lungs: clear Abdomen: soft, nontender, nondistended. No hepatosplenomegaly. No bruits or masses. Good bowel sounds. Extremities: obese LEs, no cyanosis, clubbing, rash, edema Neuro: alert & oriented x 3, cranial nerves grossly intact. moves all 4 extremities w/o difficulty. Affect pleasant.   Assessment/Plan: 1. Chronic systolic CHF: Nonischemic cardiomyopathy.  EF up to 45% on 12/16, repeat echo 11/18 shows that EF remains about 45%.  Etiology of NICM still unclear. Had frequent PVCs by past holter (12%) but doesn't explain her cardiomyopathy. Had improvement of palpitations on Coreg. Cannot rule out myocarditis. May also have component of Takotsubo with passing of child but EF has not improved as completely as would be expected with Takotsubo. Have previously discussed cardiac MRI to look for infiltrative disease but she says that she would be too claustrophobic.  Echo in 11/19 showed EF down to 30-35% and mild to moderate RV systolic function.  There also appeared to be severe mitral regurgitation, which seemed progressive from the past. TEE in 11/19 confirmed severe MR with restricted posterior leaflet => Mitraclip 1/20 with decrease in MR to 1+.  Echo in 1/20 showed EF 30-35% with mild MR s/p Mitraclip. Echo in 9/20 with stable EF 30-35% but dilated IVC suggestive of volume overload.  Echo in 1/22 with EF 40-45%, normal RV.  Volume and functional status improved w/ diuretic adjustment. Wt down 10 lb. NYHA class II  - Continue torsemide, alternating 40 and 20 mg daily    - She is off spironolactone with hyperkalemia.   - Continue Coreg 25 mg bid.  - Continue Entresto 97/103 bid.   - She did not tolerate Bidil 1/2 tab tid.  - If K rises again, she did not tolerate Veltassa so would try Lokelma.  - Continue empagliflozin 10 mg daily.       - EF now above ICD range.   - Check BMP today  2. PVCs: Frequent (12%) on 10/15 holter.   Pt refused repeat holter monitor for further  assessment. PVC count was probably not high enough to cause cardiomyopathy, unlikely primary etiology. - Improved palpitations on Coreg.   3. Pulmonary hypertension: Primarily pulmonary venous HTN (PVR 3.1) from elevated left atrial pressure (previous RHC above).   - Best treatment remains adequate diuresis. 4. Mitral regurgitation:  TEE in 11/19 with severe MR, restricted posterior leaflet.  She had Mitraclip in 1/20.  Post-op echo showed mild MR and mild mitral stenosis.  Echo in 1/22 showed moderate MR, mild-moderate MS. - Will need antibiotics for dental prophylaxis.   F/u w/ Dr. Aundra Dubin in 3 months    Signed, Lyda Jester, PA-C  04/10/2021  [place] [address] [phone] (office) [phone] (fax)

## 2021-05-21 ENCOUNTER — Other Ambulatory Visit (HOSPITAL_COMMUNITY): Payer: Self-pay | Admitting: *Deleted

## 2021-05-21 MED ORDER — CARVEDILOL 25 MG PO TABS: 25.0000 mg | ORAL_TABLET | Freq: Two times a day (BID) | ORAL | 2 refills | Status: DC

## 2021-05-23 ENCOUNTER — Other Ambulatory Visit: Payer: Self-pay | Admitting: Family Medicine

## 2021-05-23 DIAGNOSIS — Z1231 Encounter for screening mammogram for malignant neoplasm of breast: Secondary | ICD-10-CM

## 2021-07-15 ENCOUNTER — Other Ambulatory Visit (HOSPITAL_COMMUNITY): Payer: Self-pay | Admitting: *Deleted

## 2021-07-15 MED ORDER — TORSEMIDE 20 MG PO TABS: ORAL_TABLET | ORAL | 3 refills | Status: DC

## 2021-07-21 ENCOUNTER — Other Ambulatory Visit: Payer: Self-pay

## 2021-07-21 ENCOUNTER — Ambulatory Visit
Admission: RE | Admit: 2021-07-21 | Discharge: 2021-07-21 | Disposition: A | Discharge status: Home / Self Care | Payer: PPO | Source: Ambulatory Visit | Attending: Family Medicine | Admitting: Family Medicine

## 2021-07-21 DIAGNOSIS — Z1231 Encounter for screening mammogram for malignant neoplasm of breast: Secondary | ICD-10-CM

## 2021-08-11 ENCOUNTER — Ambulatory Visit (HOSPITAL_COMMUNITY)
Admission: RE | Admit: 2021-08-11 | Discharge: 2021-08-11 | Disposition: A | Discharge status: Home / Self Care | Payer: PPO | Source: Ambulatory Visit | Attending: Cardiology | Admitting: Cardiology

## 2021-08-11 ENCOUNTER — Other Ambulatory Visit: Payer: Self-pay

## 2021-08-11 ENCOUNTER — Encounter (HOSPITAL_COMMUNITY): Payer: Self-pay | Admitting: Cardiology

## 2021-08-11 ENCOUNTER — Telehealth (HOSPITAL_COMMUNITY): Payer: Self-pay

## 2021-08-11 VITALS — BP 100/60 | HR 69 | Wt 185.0 lb

## 2021-08-11 DIAGNOSIS — Z79899 Other long term (current) drug therapy: Secondary | ICD-10-CM | POA: Insufficient documentation

## 2021-08-11 DIAGNOSIS — I050 Rheumatic mitral stenosis: Secondary | ICD-10-CM | POA: Insufficient documentation

## 2021-08-11 DIAGNOSIS — I130 Hypertensive heart and chronic kidney disease with heart failure and stage 1 through stage 4 chronic kidney disease, or unspecified chronic kidney disease: Secondary | ICD-10-CM | POA: Diagnosis not present

## 2021-08-11 DIAGNOSIS — T82857D Stenosis of cardiac prosthetic devices, implants and grafts, subsequent encounter: Secondary | ICD-10-CM | POA: Insufficient documentation

## 2021-08-11 DIAGNOSIS — Z7982 Long term (current) use of aspirin: Secondary | ICD-10-CM | POA: Diagnosis not present

## 2021-08-11 DIAGNOSIS — I5022 Chronic systolic (congestive) heart failure: Secondary | ICD-10-CM

## 2021-08-11 DIAGNOSIS — Z7983 Long term (current) use of bisphosphonates: Secondary | ICD-10-CM | POA: Insufficient documentation

## 2021-08-11 DIAGNOSIS — I272 Pulmonary hypertension, unspecified: Secondary | ICD-10-CM | POA: Diagnosis not present

## 2021-08-11 DIAGNOSIS — Y838 Other surgical procedures as the cause of abnormal reaction of the patient, or of later complication, without mention of misadventure at the time of the procedure: Secondary | ICD-10-CM | POA: Insufficient documentation

## 2021-08-11 DIAGNOSIS — Z7984 Long term (current) use of oral hypoglycemic drugs: Secondary | ICD-10-CM | POA: Diagnosis not present

## 2021-08-11 DIAGNOSIS — N183 Chronic kidney disease, stage 3 unspecified: Secondary | ICD-10-CM | POA: Diagnosis not present

## 2021-08-11 DIAGNOSIS — I428 Other cardiomyopathies: Secondary | ICD-10-CM | POA: Insufficient documentation

## 2021-08-11 DIAGNOSIS — I493 Ventricular premature depolarization: Secondary | ICD-10-CM | POA: Insufficient documentation

## 2021-08-11 LAB — BASIC METABOLIC PANEL
Anion gap: 9 (ref 5–15)
BUN: 84 mg/dL — ABNORMAL HIGH (ref 8–23)
CO2: 23 mmol/L (ref 22–32)
Calcium: 9.5 mg/dL (ref 8.9–10.3)
Chloride: 106 mmol/L (ref 98–111)
Creatinine, Ser: 2.12 mg/dL — ABNORMAL HIGH (ref 0.44–1.00)
GFR, Estimated: 25 mL/min — ABNORMAL LOW (ref >60)
Glucose, Bld: 126 mg/dL — ABNORMAL HIGH (ref 70–99)
Potassium: 4.5 mmol/L (ref 3.5–5.1)
Sodium: 138 mmol/L (ref 135–145)

## 2021-08-11 LAB — BRAIN NATRIURETIC PEPTIDE: B Natriuretic Peptide: 875.1 pg/mL — ABNORMAL HIGH (ref 0.0–100.0)

## 2021-08-11 MED ORDER — ENTRESTO 49-51 MG PO TABS: 1.0000 | ORAL_TABLET | Freq: Two times a day (BID) | ORAL | 11 refills | Status: DC

## 2021-08-11 NOTE — Progress Notes (Signed)
ID:  Vanessa [name], DOB 01/26/1951, MRN [id number]   Provider location: [facility] Type of Visit: Established patient   PCP:  Lennie Odor, PA  Cardiologist:   Dr. Aundra Dubin   History of Present Illness: Vanessa [name] is a 70 y.o. with history of nonischemic cardiomyopathy and presents for followup of CHF. Patient has had a low EF recognized since at least 9/14, when echo showed EF 40-45%.  In 06-16-23, her child died, which led to significant stress.  She developed worsening exertional dyspnea and subsequent echo showed a fall in EF to 25-30%.  She had left and right heart catheterization in 3/16 with no significant coronary disease detected and moderately elevated right and left heart filling pressures along with pulmonary venous hypertension.  Echo was done 12/16: EF 45%, diffuse hypokinesis. She had a repeat echo 11/17 => EF 45% with moderately dilated LV, moderate MR.  Echo 11/18 showed EF stable at 45% with mild LV dilation and mildly decreased RV systolic function, moderate MR.   Spironolactone was stopped due to elevated K. She apparently did not tolerate Veltassa well.   Echo was done in 11/19.  EF 30-35% with mild LV hypertrophy, mild-moderately decreased RV systolic function, possible severe MR with restricted posterior leaflet, PASP 50 mmHg, severe LAE.  TEE was done in 11/19 to assess mitral regurgitation.  This showed severe MR with restricted posterior leaflet.   In 1/20, she had Mitraclip with reduction in MR to 1+.    She had echo in 9/20 with EF 30-35%, severe LAE, normal RV, s/p Mitraclip with mild-moderate MR and mild MS.  The IVC was dilated, suggesting volume overload.   She was unable to tolerate Bidil due to lightheadedness.   Echo in 1/21 showed EF 40-45%, RV normal, biatrial dilation, mild mitral stenosis mean 5 mmHg, mild-moderate MR (s/p Mitraclip).  Echo in 1/22 showed EF 40-45% with moderate LV dilation, normal RV size and systolic  function, severe LAE, s/p Mitraclip with probably mild mitral stenosis (mean gradient 7, MVA 2.2 cm^2 by PHT), moderate MR.   Weight down 2 lbs.  She has had some episodes of lightheadedness with standing, BP 100/60 today.  No syncope or falls. No significant exertional dyspnea, no problems walking up stairs.  No orthopnea/PND.  Still works part time at a daycare.    Labs (7/16): K 4.5, creatinine 1.12, BNP 596 Labs (8/16): K 4 => 5.1, creatinine 1.29 => 1.5, BNP 388 => 426 Labs (9/16): K 4, creatinine 1.08, BNP 502 Labs (12/16): K 4.9, creatinine 1.57 Labs (5/17): K 5.2, creatinine 1.3 Labs (8/17): BNP 152 Labs (11/17): K 4.9, creatinine 1.32, BNP 236 Labs (8/18): K 4.6, creatinine 1.57 Labs (11/18): K 3.8, creatinine 1.29 Labs (2/19): K 5.1, creatinine 1.44 Labs (9/19): K 3.8, creatinine 1.08 Labs (11/19): K 4.3, creatinine 1.3 Labs (1/20): K 4.8, creatinine 1.68 Labs (3/20): K 4.4, creatinine 1.5 Labs (9/20): K 4.4, creatinine 1.77 => 1.48, LDL 109 Labs (10/20): K 4.6, creatinine 1.66 Labs (12/20): K 4.7, creatinine 1.59 Labs (3/21): K 4.5, creatinine 1.73 Labs (6/21): K 4.6, creatinine 1.7 Labs (10/21): TSH normal, K 4.2, creatinine 1.37 Labs (3/22): K 4.7, creatinine 1.44 Labs (5/22): K 4.2, creatinine 1.75 Labs (9/22): K 4.5, creatinine 2.12  PMH: 1. Cardiomyopathy: Nonischemic.  Echo (9/14) with EF 40-45%.  Echo (2/16) with EF 25-30%.  Echo (4/16) with EF 30-35%, moderate MR.  Echo (6/16) with EF 40%, moderate LV dilation, moderate  diastolic dysfunction, moderate MR, moderate TR, PA systolic pressure 52 mmHg. RHC/LHC (3/16) with no significant CAD, EF 20-25%, 3+ MR; mean RA 13, PA 70/30 mean 45, mean PCWP 31 mmHg, CI 2.2, PVR 3.1 WU.  Echo (12/16) with EF 45%, diffuse hypokinesis, moderate MR, normal RV size and systolic function.  - Echo (11/17): EF 45%, moderately dilated LV, moderate eccentric posterior MR, PASP 42 mmHg, normal RV size and systolic function.  - Echo (11/18):  EF 45%, mild LV dilation, moderate diastolic dysfunction, normal RV size with mildly decreased systolic function, PASP 50 mmHg, moderate MR.  - Echo (11/19): EF 30-35%, mild LV dilation, severe LAE, mild-moderately decreased RV systolic function, possible severe MR with restricted posterior leaflet, PASP 50 mmHg.  - Echo (1/20): EF 30-35%, severe LV dilation, s/p Mitraclip with mild MR and mild MS, PASP 48 mmHg.  - RHC (1/20, with Mitraclip): mean RA 10, mean LA 18 - Echo (2/20): EF 30-35%, s/p mitraclip with mild mitral stenosis, mean MV gradient 5 mmHg, moderate MR, PASP 56 mmHg.  - Echo (9/20): EF 30-35%, severe LAE, normal RV, s/p Mitraclip with mild-moderate MR and mild MS.  The IVC was dilated, suggesting volume overload.  - Echo (1/21): EF 40-45%, RV normal, biatrial dilation, mild mitral stenosis mean 5 mmHg, mild-moderate MR (s/p Mitraclip).  - Echo (1/22): EF 40-45% with moderate LV dilation, normal RV size and systolic function, severe LAE, s/p Mitraclip with probably mild mitral stenosis (mean gradient 7, MVA 2.2 cm^2 by PHT), moderate MR. 2. PVCs: Holter (10/15) with 12% PVCs. 3. Pulmonary venous hypertension: CTA chest (7/16) negative for PE. PFTs (7/16) with minimal obstruction, mild restriction, moderately decreased DLCO.  4. Thyroid nodules: Biopsy benign.  5. CKD stage 3 6. Mitral regurgitation: TEE (11/19) with EF 30-35%, moderate LV dilation, severe MR with posterior leaflet restriction and ERO 0.41 cm^2, no mitral stenosis, mild to moderately decreased RV systolic function, peak RV-RA gradient 50 mmHg.  - Mitraclip placement x 2 in 1/20 with 1+ residual MR.  - Echo (2/20) with moderate residual MR, mild MS with mean gradient 5 mmHg.  - Echo (9/20) with mild-moderate MR, mild MS.  - Echo (1/21) with mild-moderate MR, mild MS s/p Mitraclip - Echo (1/22) with moderate residual MR, mild-moderate mitral stenosis.   SH: Lives alone, only child passed away in 2023-06-29, nonsmoker, no ETOH  or drugs.   FH: Father with MIs  ROS: All systems reviewed and negative except as per HPI.   Current Outpatient Medications  Medication Sig Dispense Refill   amoxicillin (AMOXIL) 500 MG tablet Take 4 tablets by mouth one hour prior to dental appointments 8 tablet 11   aspirin 81 MG EC tablet Take 1 tablet (81 mg total) by mouth daily.     Calcium Carbonate-Vitamin D (CALCIUM 600+D PO) Take 1 tablet by mouth daily.     carvedilol (COREG) 25 MG tablet Take 1 tablet (25 mg total) by mouth 2 (two) times daily with a meal. 180 tablet 2   empagliflozin (JARDIANCE) 10 MG TABS tablet Take 1 tablet (10 mg total) by mouth daily before breakfast. 90 tablet 3   ibandronate (BONIVA) 150 MG tablet Take 150 mg by mouth every 30 (thirty) days. Take in the morning with a full glass of water, on an empty stomach, and do not take anything else by mouth or lie down for the next 30 min.     torsemide (DEMADEX) 20 MG tablet Taking '40mg'$  every other day alternating with  $'20mg'n$  every other day. 35 tablet 3   sacubitril-valsartan (ENTRESTO) 49-51 MG Take 1 tablet by mouth 2 (two) times daily. 60 tablet 11   No current facility-administered medications for this encounter.   BP 100/60   Pulse 69   Wt 83.9 kg (185 lb)   SpO2 99%   BMI 33.84 kg/m    Wt Readings from Last 3 Encounters:  08/11/21 83.9 kg (185 lb)  04/10/21 84.9 kg (187 lb 3.2 oz)  03/11/21 89.7 kg (197 lb 12.8 oz)   Exam:   General: NAD Neck: No JVD, no thyromegaly or thyroid nodule.  Lungs: Clear to auscultation bilaterally with normal respiratory effort. CV: Nondisplaced PMI.  Heart regular S1/S2, no S3/S4, 2/6 HSM apex.  No peripheral edema.  No carotid bruit.  Normal pedal pulses.  Abdomen: Soft, nontender, no hepatosplenomegaly, no distention.  Skin: Intact without lesions or rashes.  Neurologic: Alert and oriented x 3.  Psych: Normal affect. Extremities: No clubbing or cyanosis.  HEENT: Normal.    Assessment/Plan: 1. Chronic  systolic CHF: Nonischemic cardiomyopathy.  EF up to 45% on 12/16, repeat echo 11/18 shows that EF remains about 45%.  Etiology of NICM still unclear. Had frequent PVCs by past holter (12%) but doesn't explain her cardiomyopathy. Had improvement of palpitations on Coreg. Cannot rule out myocarditis. May also have component of Takotsubo with passing of child but EF has not improved as completely as would be expected with Takotsubo. Have previously discussed cardiac MRI to look for infiltrative disease but she says that she would be too claustrophobic.  Echo in 11/19 showed EF down to 30-35% and mild to moderate RV systolic function.  There also appeared to be severe mitral regurgitation, which seemed progressive from the past. TEE in 11/19 confirmed severe MR with restricted posterior leaflet => Mitraclip 1/20 with decrease in MR to 1+.  Echo in 1/20 showed EF 30-35% with mild MR s/p Mitraclip. Echo in 9/20 with stable EF 30-35% but dilated IVC suggestive of volume overload.  Echo in 1/22 with EF 40-45%, normal RV.  Today, weight down 2 lbs.  She is not volume overloaded on exam.  She has orthostatic symptoms and creatinine up to 2.1.  - Hold torsemide for a day then decrease to 20 mg daily.  Repeat BMET in a week.  - She is off spironolactone with hyperkalemia.   - Continue Coreg 25 mg bid.  - Hold Entresto for a day then decrease to 49/51 bid.  - She did not tolerate Bidil 1/2 tab tid.  - If K rises again, she did not tolerate Veltassa so would try Lokelma.  - Continue empagliflozin 10 mg daily.      - EF now above ICD range.   - Wear compression stockings during the day with orthostatic-type symptoms.  2. PVCs: Frequent (12%) on 10/15 holter.   Pt refused repeat holter monitor for further assessment. PVC count was probably not high enough to cause cardiomyopathy, unlikely primary etiology. - Improved palpitations on Coreg.   3. Pulmonary hypertension: Primarily pulmonary venous HTN (PVR 3.1) from  elevated left atrial pressure (previous RHC above).   - Best treatment remains adequate diuresis. 4. Mitral regurgitation:  TEE in 11/19 with severe MR, restricted posterior leaflet.  She had Mitraclip in 1/20. Post-op echo showed mild MR and mild mitral stenosis.  Echo in 1/22 showed moderate MR, mild-moderate MS. - Will need antibiotics for dental prophylaxis.   Recommended follow-up:  3 months.   Signed, [name],  MD  08/11/2021  [facility] [address] [facility] [place] [address] [phone] (office) [phone] (fax)

## 2021-08-11 NOTE — Patient Instructions (Signed)
Labs done today. We will contact you only if your labs are abnormal.  No medication changes were made. Please continue all current medications as prescribed.  Please wear your compression hose daily, place them on as soon as you get up in the morning and remove before you go to bed at night. A prescription was provided to you during your appointment today. You can pick this up at [facility];[address], [place], [place] [address] [phone]  Your physician recommends that you schedule a follow-up appointment in: 3 months  If you have any questions or concerns before your next appointment please send [place] a message through mychart or call our office at [phone].    TO LEAVE A MESSAGE FOR THE NURSE SELECT OPTION 2, PLEASE LEAVE A MESSAGE INCLUDING: YOUR NAME DATE OF BIRTH CALL BACK NUMBER REASON FOR CALL**this is important as we prioritize the call backs  YOU WILL RECEIVE A CALL BACK THE SAME DAY AS LONG AS YOU CALL BEFORE 4:00 PM   Do the following things EVERYDAY: Weigh yourself in the morning before breakfast. Write it down and keep it in a log. Take your medicines as prescribed Eat low salt foods--Limit salt (sodium) to 2000 mg per day.  Stay as active as you can everyday Limit all fluids for the day to less than 2 liters   At the [facility], you and your health needs are our priority. As part of our continuing mission to provide you with exceptional heart care, we have created designated Provider Care Teams. These Care Teams include your primary Cardiologist (physician) and Advanced Practice Providers (APPs- Physician Assistants and Nurse Practitioners) who all work together to provide you with the care you need, when you need it.   You may see any of the following providers on your designated Care Team at your next follow up: Dr Glori Bickers Dr Haynes Kerns, NP Lyda Jester, [place] Audry Riles, PharmD   Please be sure  to bring in all your medications bottles to every appointment.

## 2021-08-11 NOTE — Telephone Encounter (Signed)
-----   Message from Larey Dresser, MD sent at 08/11/2021  4:11 PM EDT ----- Hold torsemide and Entresto x 1 day, then decrease torsemide to 20 mg daily and decrease Entresto to 49/51 bid.  BMET 1 week.

## 2021-08-11 NOTE — Telephone Encounter (Signed)
Patient advised and verbalized understanding. Med list updated to reflect changes, lab appt scheduled,lab order entered.   Meds ordered this encounter  Medications   sacubitril-valsartan (ENTRESTO) 49-51 MG    Sig: Take 1 tablet by mouth 2 (two) times daily.    Dispense:  60 tablet    Refill:  11    Please cancel all previous orders for current medication. Change in dosage or pill size.   Orders Placed This Encounter  Procedures   Basic metabolic panel    Standing Status:   Future    Standing Expiration Date:   08/11/2022    Order Specific Question:   Release to patient    Answer:   Immediate

## 2021-08-12 ENCOUNTER — Other Ambulatory Visit (HOSPITAL_COMMUNITY): Payer: Self-pay | Admitting: *Deleted

## 2021-08-13 ENCOUNTER — Other Ambulatory Visit (HOSPITAL_COMMUNITY): Payer: Self-pay | Admitting: *Deleted

## 2021-08-13 MED ORDER — ENTRESTO 49-51 MG PO TABS: 1.0000 | ORAL_TABLET | Freq: Two times a day (BID) | ORAL | 11 refills | Status: DC

## 2021-08-19 ENCOUNTER — Ambulatory Visit (HOSPITAL_COMMUNITY)
Admission: RE | Admit: 2021-08-19 | Discharge: 2021-08-19 | Disposition: A | Discharge status: Home / Self Care | Payer: PPO | Source: Ambulatory Visit | Attending: Cardiology | Admitting: Cardiology

## 2021-08-19 ENCOUNTER — Other Ambulatory Visit: Payer: Self-pay

## 2021-08-19 DIAGNOSIS — I5022 Chronic systolic (congestive) heart failure: Secondary | ICD-10-CM | POA: Insufficient documentation

## 2021-08-19 LAB — BASIC METABOLIC PANEL
Anion gap: 8 (ref 5–15)
BUN: 77 mg/dL — ABNORMAL HIGH (ref 8–23)
CO2: 21 mmol/L — ABNORMAL LOW (ref 22–32)
Calcium: 8.8 mg/dL — ABNORMAL LOW (ref 8.9–10.3)
Chloride: 109 mmol/L (ref 98–111)
Creatinine, Ser: 1.81 mg/dL — ABNORMAL HIGH (ref 0.44–1.00)
GFR, Estimated: 30 mL/min — ABNORMAL LOW (ref >60)
Glucose, Bld: 118 mg/dL — ABNORMAL HIGH (ref 70–99)
Potassium: 4.8 mmol/L (ref 3.5–5.1)
Sodium: 138 mmol/L (ref 135–145)

## 2021-09-10 DIAGNOSIS — R7303 Prediabetes: Secondary | ICD-10-CM | POA: Diagnosis not present

## 2021-09-10 DIAGNOSIS — D6869 Other thrombophilia: Secondary | ICD-10-CM | POA: Diagnosis not present

## 2021-09-10 DIAGNOSIS — Z0000 Encounter for general adult medical examination without abnormal findings: Secondary | ICD-10-CM | POA: Diagnosis not present

## 2021-09-10 DIAGNOSIS — I272 Pulmonary hypertension, unspecified: Secondary | ICD-10-CM | POA: Diagnosis not present

## 2021-09-10 DIAGNOSIS — N1832 Chronic kidney disease, stage 3b: Secondary | ICD-10-CM | POA: Diagnosis not present

## 2021-09-10 DIAGNOSIS — E041 Nontoxic single thyroid nodule: Secondary | ICD-10-CM | POA: Diagnosis not present

## 2021-09-10 DIAGNOSIS — I509 Heart failure, unspecified: Secondary | ICD-10-CM | POA: Diagnosis not present

## 2021-09-10 DIAGNOSIS — Z23 Encounter for immunization: Secondary | ICD-10-CM | POA: Diagnosis not present

## 2021-09-10 DIAGNOSIS — I119 Hypertensive heart disease without heart failure: Secondary | ICD-10-CM | POA: Diagnosis not present

## 2021-09-10 DIAGNOSIS — I429 Cardiomyopathy, unspecified: Secondary | ICD-10-CM | POA: Diagnosis not present

## 2021-11-11 ENCOUNTER — Other Ambulatory Visit (HOSPITAL_COMMUNITY): Payer: Self-pay

## 2021-11-11 ENCOUNTER — Other Ambulatory Visit: Payer: Self-pay

## 2021-11-11 ENCOUNTER — Ambulatory Visit (HOSPITAL_COMMUNITY)
Admission: RE | Admit: 2021-11-11 | Discharge: 2021-11-11 | Disposition: A | Discharge status: Home / Self Care | Payer: PPO | Source: Ambulatory Visit | Attending: Cardiology | Admitting: Cardiology

## 2021-11-11 ENCOUNTER — Encounter (HOSPITAL_COMMUNITY): Payer: Self-pay | Admitting: Cardiology

## 2021-11-11 ENCOUNTER — Telehealth (HOSPITAL_COMMUNITY): Payer: Self-pay | Admitting: Cardiology

## 2021-11-11 VITALS — BP 110/68 | HR 70 | Wt 192.8 lb

## 2021-11-11 DIAGNOSIS — N183 Chronic kidney disease, stage 3 unspecified: Secondary | ICD-10-CM | POA: Insufficient documentation

## 2021-11-11 DIAGNOSIS — I272 Pulmonary hypertension, unspecified: Secondary | ICD-10-CM | POA: Insufficient documentation

## 2021-11-11 DIAGNOSIS — I5022 Chronic systolic (congestive) heart failure: Secondary | ICD-10-CM | POA: Insufficient documentation

## 2021-11-11 DIAGNOSIS — Z7984 Long term (current) use of oral hypoglycemic drugs: Secondary | ICD-10-CM | POA: Diagnosis not present

## 2021-11-11 DIAGNOSIS — R002 Palpitations: Secondary | ICD-10-CM | POA: Diagnosis not present

## 2021-11-11 DIAGNOSIS — Z79899 Other long term (current) drug therapy: Secondary | ICD-10-CM | POA: Diagnosis not present

## 2021-11-11 DIAGNOSIS — I493 Ventricular premature depolarization: Secondary | ICD-10-CM | POA: Insufficient documentation

## 2021-11-11 DIAGNOSIS — I428 Other cardiomyopathies: Secondary | ICD-10-CM | POA: Diagnosis not present

## 2021-11-11 DIAGNOSIS — I052 Rheumatic mitral stenosis with insufficiency: Secondary | ICD-10-CM | POA: Diagnosis not present

## 2021-11-11 DIAGNOSIS — E041 Nontoxic single thyroid nodule: Secondary | ICD-10-CM | POA: Insufficient documentation

## 2021-11-11 DIAGNOSIS — E875 Hyperkalemia: Secondary | ICD-10-CM | POA: Insufficient documentation

## 2021-11-11 DIAGNOSIS — E0590 Thyrotoxicosis, unspecified without thyrotoxic crisis or storm: Secondary | ICD-10-CM

## 2021-11-11 DIAGNOSIS — I130 Hypertensive heart and chronic kidney disease with heart failure and stage 1 through stage 4 chronic kidney disease, or unspecified chronic kidney disease: Secondary | ICD-10-CM | POA: Insufficient documentation

## 2021-11-11 LAB — BASIC METABOLIC PANEL
Anion gap: 8 (ref 5–15)
BUN: 52 mg/dL — ABNORMAL HIGH (ref 8–23)
CO2: 29 mmol/L (ref 22–32)
Calcium: 9.1 mg/dL (ref 8.9–10.3)
Chloride: 104 mmol/L (ref 98–111)
Creatinine, Ser: 1.71 mg/dL — ABNORMAL HIGH (ref 0.44–1.00)
GFR, Estimated: 32 mL/min — ABNORMAL LOW (ref >60)
Glucose, Bld: 108 mg/dL — ABNORMAL HIGH (ref 70–99)
Potassium: 4.4 mmol/L (ref 3.5–5.1)
Sodium: 141 mmol/L (ref 135–145)

## 2021-11-11 LAB — T4, FREE: Free T4: 1.55 ng/dL — ABNORMAL HIGH (ref 0.61–1.12)

## 2021-11-11 LAB — TSH: TSH: 0.221 u[IU]/mL — ABNORMAL LOW (ref 0.350–4.500)

## 2021-11-11 MED ORDER — LOKELMA 10 G PO PACK: PACK | ORAL | 3 refills | Status: DC

## 2021-11-11 MED ORDER — TORSEMIDE 20 MG PO TABS: ORAL_TABLET | ORAL | 3 refills | Status: DC

## 2021-11-11 MED ORDER — AMOXICILLIN 500 MG PO TABS: ORAL_TABLET | ORAL | 11 refills | Status: DC

## 2021-11-11 MED ORDER — SPIRONOLACTONE 25 MG PO TABS: 12.5000 mg | ORAL_TABLET | Freq: Every day | ORAL | 3 refills | Status: DC

## 2021-11-11 NOTE — Progress Notes (Signed)
ID:  TONETTE KOEHNE, DOB [birth year], MRN [id number]   Provider location: [facility] Type of Visit: Established patient   PCP:  Lennie Odor, PA  Cardiologist:   Dr. Aundra Dubin   History of Present Illness: Vanessa [name] is a 70 y.o. with history of nonischemic cardiomyopathy and presents for followup of CHF. Patient has had a low EF recognized since at least 9/14, when echo showed EF 40-45%.  In 2023/06/22, her child died, which led to significant stress.  She developed worsening exertional dyspnea and subsequent echo showed a fall in EF to 25-30%.  She had left and right heart catheterization in 3/16 with no significant coronary disease detected and moderately elevated right and left heart filling pressures along with pulmonary venous hypertension.  Echo was done 12/16: EF 45%, diffuse hypokinesis. She had a repeat echo 11/17 => EF 45% with moderately dilated LV, moderate MR.  Echo 11/18 showed EF stable at 45% with mild LV dilation and mildly decreased RV systolic function, moderate MR.   Spironolactone was stopped due to elevated K. She apparently did not tolerate Veltassa well.   Echo was done in 11/19.  EF 30-35% with mild LV hypertrophy, mild-moderately decreased RV systolic function, possible severe MR with restricted posterior leaflet, PASP 50 mmHg, severe LAE.  TEE was done in 11/19 to assess mitral regurgitation.  This showed severe MR with restricted posterior leaflet.   In 1/20, she had Mitraclip with reduction in MR to 1+.    She had echo in 9/20 with EF 30-35%, severe LAE, normal RV, s/p Mitraclip with mild-moderate MR and mild MS.  The IVC was dilated, suggesting volume overload.   She was unable to tolerate Bidil due to lightheadedness.   Echo in 1/21 showed EF 40-45%, RV normal, biatrial dilation, mild mitral stenosis mean 5 mmHg, mild-moderate MR (s/p Mitraclip).  Echo in 1/22 showed EF 40-45% with moderate LV dilation, normal RV size and systolic  function, severe LAE, s/p Mitraclip with probably mild mitral stenosis (mean gradient 7, MVA 2.2 cm^2 by PHT), moderate MR.   Weight is up about 7 lbs.  She is taking torsemide 20 mg daily.  She gets short of breath walking longer distances shopping (such as at [employer]).  She is not short of breath walking short distances.  She does get short of breath walking up stairs.  No chest pain.  No orthopnea/PND.    ECG (personally reviewed): NSR, nonspecific T wave changes.   Labs (7/16): K 4.5, creatinine 1.12, BNP 596 Labs (8/16): K 4 => 5.1, creatinine 1.29 => 1.5, BNP 388 => 426 Labs (9/16): K 4, creatinine 1.08, BNP 502 Labs (12/16): K 4.9, creatinine 1.57 Labs (5/17): K 5.2, creatinine 1.3 Labs (8/17): BNP 152 Labs (11/17): K 4.9, creatinine 1.32, BNP 236 Labs (8/18): K 4.6, creatinine 1.57 Labs (11/18): K 3.8, creatinine 1.29 Labs (2/19): K 5.1, creatinine 1.44 Labs (9/19): K 3.8, creatinine 1.08 Labs (11/19): K 4.3, creatinine 1.3 Labs (1/20): K 4.8, creatinine 1.68 Labs (3/20): K 4.4, creatinine 1.5 Labs (9/20): K 4.4, creatinine 1.77 => 1.48, LDL 109 Labs (10/20): K 4.6, creatinine 1.66 Labs (12/20): K 4.7, creatinine 1.59 Labs (3/21): K 4.5, creatinine 1.73 Labs (6/21): K 4.6, creatinine 1.7 Labs (10/21): TSH normal, K 4.2, creatinine 1.37 Labs (3/22): K 4.7, creatinine 1.44 Labs (5/22): K 4.2, creatinine 1.75 Labs (9/22): K 4.5, creatinine 2.12 => 1.81  PMH: 1. Cardiomyopathy: Nonischemic.  Echo (9/14) with EF  40-45%.  Echo (2/16) with EF 25-30%.  Echo (4/16) with EF 30-35%, moderate MR.  Echo (6/16) with EF 40%, moderate LV dilation, moderate diastolic dysfunction, moderate MR, moderate TR, PA systolic pressure 52 mmHg. RHC/LHC (3/16) with no significant CAD, EF 20-25%, 3+ MR; mean RA 13, PA 70/30 mean 45, mean PCWP 31 mmHg, CI 2.2, PVR 3.1 WU.  Echo (12/16) with EF 45%, diffuse hypokinesis, moderate MR, normal RV size and systolic function.  - Echo (11/17): EF 45%, moderately  dilated LV, moderate eccentric posterior MR, PASP 42 mmHg, normal RV size and systolic function.  - Echo (11/18): EF 45%, mild LV dilation, moderate diastolic dysfunction, normal RV size with mildly decreased systolic function, PASP 50 mmHg, moderate MR.  - Echo (11/19): EF 30-35%, mild LV dilation, severe LAE, mild-moderately decreased RV systolic function, possible severe MR with restricted posterior leaflet, PASP 50 mmHg.  - Echo (1/20): EF 30-35%, severe LV dilation, s/p Mitraclip with mild MR and mild MS, PASP 48 mmHg.  - RHC (1/20, with Mitraclip): mean RA 10, mean LA 18 - Echo (2/20): EF 30-35%, s/p mitraclip with mild mitral stenosis, mean MV gradient 5 mmHg, moderate MR, PASP 56 mmHg.  - Echo (9/20): EF 30-35%, severe LAE, normal RV, s/p Mitraclip with mild-moderate MR and mild MS.  The IVC was dilated, suggesting volume overload.  - Echo (1/21): EF 40-45%, RV normal, biatrial dilation, mild mitral stenosis mean 5 mmHg, mild-moderate MR (s/p Mitraclip).  - Echo (1/22): EF 40-45% with moderate LV dilation, normal RV size and systolic function, severe LAE, s/p Mitraclip with probably mild mitral stenosis (mean gradient 7, MVA 2.2 cm^2 by PHT), moderate MR. 2. PVCs: Holter (10/15) with 12% PVCs. 3. Pulmonary venous hypertension: CTA chest (7/16) negative for PE. PFTs (7/16) with minimal obstruction, mild restriction, moderately decreased DLCO.  4. Thyroid nodules: Biopsy benign.  5. CKD stage 3 6. Mitral regurgitation: TEE (11/19) with EF 30-35%, moderate LV dilation, severe MR with posterior leaflet restriction and ERO 0.41 cm^2, no mitral stenosis, mild to moderately decreased RV systolic function, peak RV-RA gradient 50 mmHg.  - Mitraclip placement x 2 in 1/20 with 1+ residual MR.  - Echo (2/20) with moderate residual MR, mild MS with mean gradient 5 mmHg.  - Echo (9/20) with mild-moderate MR, mild MS.  - Echo (1/21) with mild-moderate MR, mild MS s/p Mitraclip - Echo (1/22) with moderate  residual MR, mild-moderate mitral stenosis.   SH: Lives alone, only child passed away in July 10, 2023, nonsmoker, no ETOH or drugs.   FH: Father with MIs  ROS: All systems reviewed and negative except as per HPI.   Current Outpatient Medications  Medication Sig Dispense Refill   aspirin 81 MG EC tablet Take 1 tablet (81 mg total) by mouth daily.     Calcium Carbonate-Vitamin D (CALCIUM 600+D PO) Take 1 tablet by mouth daily.     carvedilol (COREG) 25 MG tablet Take 1 tablet (25 mg total) by mouth 2 (two) times daily with a meal. 180 tablet 2   empagliflozin (JARDIANCE) 10 MG TABS tablet Take 1 tablet (10 mg total) by mouth daily before breakfast. 90 tablet 3   ibandronate (BONIVA) 150 MG tablet Take 150 mg by mouth every 30 (thirty) days. Take in the morning with a full glass of water, on an empty stomach, and do not take anything else by mouth or lie down for the next 30 min.     LOKELMA 10 g PACK packet Take a 1/2  packet daily. 90 packet 3   sacubitril-valsartan (ENTRESTO) 49-51 MG Take 1 tablet by mouth 2 (two) times daily. 60 tablet 11   spironolactone (ALDACTONE) 25 MG tablet Take 0.5 tablets (12.5 mg total) by mouth daily. 45 tablet 3   amoxicillin (AMOXIL) 500 MG tablet Take 4 tablets by mouth one hour prior to dental appointments 8 tablet 11   torsemide (DEMADEX) 20 MG tablet Taking 40mg  every other day alternating with 20mg  every other day. 35 tablet 3   No current facility-administered medications for this encounter.   BP 110/68    Pulse 70    Wt 87.5 kg (192 lb 12.8 oz)    SpO2 97%    BMI 35.26 kg/m    Wt Readings from Last 3 Encounters:  11/11/21 87.5 kg (192 lb 12.8 oz)  08/11/21 83.9 kg (185 lb)  04/10/21 84.9 kg (187 lb 3.2 oz)   Exam:   General: NAD Neck: JVP 8 cm, large left thyroid nodule.  Lungs: Clear to auscultation bilaterally with normal respiratory effort. CV: Nondisplaced PMI.  Heart regular S1/S2, no S3/S4, 3/6 HSM apex.  No peripheral edema.  No carotid bruit.   Normal pedal pulses.  Abdomen: Soft, nontender, no hepatosplenomegaly, no distention.  Skin: Intact without lesions or rashes.  Neurologic: Alert and oriented x 3.  Psych: Normal affect. Extremities: No clubbing or cyanosis.  HEENT: Normal.   Assessment/Plan: 1. Chronic systolic CHF: Nonischemic cardiomyopathy.  EF up to 45% on 12/16, repeat echo 11/18 shows that EF remains about 45%.  Etiology of NICM still unclear. Had frequent PVCs by past holter (12%) but doesn't explain her cardiomyopathy. Had improvement of palpitations on Coreg. Cannot rule out myocarditis. May also have component of Takotsubo with passing of child but EF has not improved as completely as would be expected with Takotsubo. Have previously discussed cardiac MRI to look for infiltrative disease but she says that she would be too claustrophobic.  Echo in 11/19 showed EF down to 30-35% and mild to moderate RV systolic function.  There also appeared to be severe mitral regurgitation, which seemed progressive from the past. TEE in 11/19 confirmed severe MR with restricted posterior leaflet => Mitraclip 1/20 with decrease in MR to 1+.  Echo in 1/20 showed EF 30-35% with mild MR s/p Mitraclip. Echo in 9/20 with stable EF 30-35% but dilated IVC suggestive of volume overload.  Echo in 1/22 with EF 40-45%, normal RV.  Today, weight up 7 lbs.  She does not look significantly volume overloaded on exam, stable NYHA class II-III.  - Continue torsemide 20 mg daily.   - She is off spironolactone with hyperkalemia.  I am going to restart spironolactone 12.5 mg daily with Lokelma 5 g daily, will get BMET today and again in 10 days.  - Continue Coreg 25 mg bid.  - Continue Entresto 49/51 bid.  - She did not tolerate Bidil 1/2 tab tid. .  - Continue empagliflozin 10 mg daily.      - EF now above ICD range.   - Wear compression stockings during the day with orthostatic-type symptoms.  - Repeat echo at followup in 3 months.  2. PVCs: Frequent  (12%) on 10/15 holter.   Pt refused repeat holter monitor for further assessment. PVC count was probably not high enough to cause cardiomyopathy, unlikely primary etiology. - Improved palpitations on Coreg.   3. Pulmonary hypertension: Primarily pulmonary venous HTN (PVR 3.1) from elevated left atrial pressure (previous RHC above).   -  Best treatment remains adequate diuresis. 4. Mitral regurgitation:  TEE in 11/19 with severe MR, restricted posterior leaflet.  She had Mitraclip in 1/20. Post-op echo showed mild MR and mild mitral stenosis.  Echo in 1/22 showed moderate MR, mild-moderate MS. - Will need antibiotics for dental prophylaxis.  5. Thyroid nodule: Large thyroid nodule, had biopsy in 2016 that was negative.  - Check TSH, free T3, free T4 today.   Recommended follow-up:  3 months with echo.   Signed, [name], MD  11/11/2021  [facility] [address] [facility] [place] [address] [phone] (office) [phone] (fax)

## 2021-11-11 NOTE — Patient Instructions (Addendum)
EKG done today.  Labs done today. We will contact you only if your labs are abnormal.  START Spironolactone 12.5mg  (1/2 tablet) by mouth daily.   START Lokelma 5g (1/2 packet) by mouth daily.   Your Torsemide and amoxicillin has been refilled.   No other medication changes were made. Please continue all current medications as prescribed.  Your provider has requested that you have a Thyroid Ultrasound done. Someone will contact you to schedule this appointment.   Your physician recommends that you schedule a follow-up appointment in: 10 days for a lab only appointment and in 3 months with an echo prior to your exam.   Your physician has requested that you have an echocardiogram. Echocardiography is a painless test that uses sound waves to create images of your heart. It provides your doctor with information about the size and shape of your heart and how well your hearts chambers and valves are working. This procedure takes approximately one hour. There are no restrictions for this procedure.  If you have any questions or concerns before your next appointment please send [place] a message through [place] or call our office at [phone].    TO LEAVE A MESSAGE FOR THE NURSE SELECT OPTION 2, PLEASE LEAVE A MESSAGE INCLUDING: YOUR NAME DATE OF BIRTH CALL BACK NUMBER REASON FOR CALL**this is important as we prioritize the call backs  YOU WILL RECEIVE A CALL BACK THE SAME DAY AS LONG AS YOU CALL BEFORE 4:00 PM   Do the following things EVERYDAY: Weigh yourself in the morning before breakfast. Write it down and keep it in a log. Take your medicines as prescribed Eat low salt foods--Limit salt (sodium) to 2000 mg per day.  Stay as active as you can everyday Limit all fluids for the day to less than 2 liters   At the [facility], you and your health needs are our priority. As part of our continuing mission to provide you with exceptional heart care, we have created  designated Provider Care Teams. These Care Teams include your primary Cardiologist (physician) and Advanced Practice Providers (APPs- Physician Assistants and Nurse Practitioners) who all work together to provide you with the care you need, when you need it.   You may see any of the following providers on your designated Care Team at your next follow up: Dr Glori Bickers Dr Haynes Kerns, NP Lyda Jester, [place] Audry Riles, PharmD   Please be sure to bring in all your medications bottles to every appointment.

## 2021-11-11 NOTE — Telephone Encounter (Signed)
Patient called.  Patient aware. Referral placed 

## 2021-11-11 NOTE — Telephone Encounter (Signed)
-----   Message from Larey Dresser, MD sent at 11/11/2021  2:45 PM EST ----- Low TSH, elevated free T4.  This looks like hyperthyroidism.  She also has a large thyroid nodule. Please refer to [facility] for evaluation ASAP.

## 2021-11-12 LAB — T3, FREE: T3, Free: 2.7 pg/mL (ref 2.0–4.4)

## 2021-11-25 ENCOUNTER — Other Ambulatory Visit: Payer: Self-pay

## 2021-11-25 ENCOUNTER — Ambulatory Visit (HOSPITAL_BASED_OUTPATIENT_CLINIC_OR_DEPARTMENT_OTHER)
Admission: RE | Admit: 2021-11-25 | Discharge: 2021-11-25 | Disposition: A | Discharge status: Home / Self Care | Payer: PPO | Source: Ambulatory Visit | Attending: Cardiology | Admitting: Cardiology

## 2021-11-25 DIAGNOSIS — E041 Nontoxic single thyroid nodule: Secondary | ICD-10-CM | POA: Diagnosis not present

## 2021-11-25 DIAGNOSIS — I5022 Chronic systolic (congestive) heart failure: Secondary | ICD-10-CM | POA: Insufficient documentation

## 2021-11-25 LAB — BASIC METABOLIC PANEL
Anion gap: 7 (ref 5–15)
BUN: 33 mg/dL — ABNORMAL HIGH (ref 8–23)
CO2: 25 mmol/L (ref 22–32)
Calcium: 8.8 mg/dL — ABNORMAL LOW (ref 8.9–10.3)
Chloride: 108 mmol/L (ref 98–111)
Creatinine, Ser: 1.31 mg/dL — ABNORMAL HIGH (ref 0.44–1.00)
GFR, Estimated: 44 mL/min — ABNORMAL LOW (ref >60)
Glucose, Bld: 117 mg/dL — ABNORMAL HIGH (ref 70–99)
Potassium: 4 mmol/L (ref 3.5–5.1)
Sodium: 140 mmol/L (ref 135–145)

## 2021-11-26 ENCOUNTER — Ambulatory Visit (HOSPITAL_BASED_OUTPATIENT_CLINIC_OR_DEPARTMENT_OTHER)
Admission: RE | Admit: 2021-11-26 | Discharge: 2021-11-26 | Disposition: A | Discharge status: Home / Self Care | Payer: PPO | Source: Ambulatory Visit | Attending: Cardiology | Admitting: Cardiology

## 2021-11-26 DIAGNOSIS — E041 Nontoxic single thyroid nodule: Secondary | ICD-10-CM | POA: Diagnosis not present

## 2021-11-26 DIAGNOSIS — I5022 Chronic systolic (congestive) heart failure: Secondary | ICD-10-CM | POA: Diagnosis not present

## 2021-12-04 ENCOUNTER — Other Ambulatory Visit (HOSPITAL_COMMUNITY): Payer: Self-pay | Admitting: *Deleted

## 2021-12-04 DIAGNOSIS — E041 Nontoxic single thyroid nodule: Secondary | ICD-10-CM

## 2021-12-04 NOTE — Progress Notes (Signed)
Pt aware and referral placed.  

## 2022-01-05 ENCOUNTER — Telehealth (HOSPITAL_COMMUNITY): Payer: Self-pay | Admitting: Pharmacist

## 2022-01-05 ENCOUNTER — Other Ambulatory Visit (HOSPITAL_COMMUNITY): Payer: Self-pay

## 2022-01-05 NOTE — Telephone Encounter (Signed)
Patient Advocate Encounter   Received notification that prior authorization for Milly Jakob is required.   PA submitted on CoverMyMeds Key [id number] Status is pending   Will continue to follow.  Audry Riles, PharmD, BCPS, BCCP, CPP Heart Failure Clinic Pharmacist [phone]

## 2022-01-07 ENCOUNTER — Other Ambulatory Visit (HOSPITAL_COMMUNITY): Payer: Self-pay

## 2022-01-07 ENCOUNTER — Encounter: Payer: Self-pay | Admitting: Endocrinology

## 2022-01-07 ENCOUNTER — Other Ambulatory Visit: Payer: Self-pay

## 2022-01-07 ENCOUNTER — Ambulatory Visit (INDEPENDENT_AMBULATORY_CARE_PROVIDER_SITE_OTHER): Payer: PPO | Admitting: Endocrinology

## 2022-01-07 DIAGNOSIS — E0590 Thyrotoxicosis, unspecified without thyrotoxic crisis or storm: Secondary | ICD-10-CM

## 2022-01-07 MED ORDER — METHIMAZOLE 5 MG PO TABS: 5.0000 mg | ORAL_TABLET | ORAL | 1 refills | Status: DC

## 2022-01-07 NOTE — Progress Notes (Signed)
Subjective:    Patient ID: Vanessa [name], female    DOB: [birth year], 71 y.o.   MRN: [id number]  HPI Pt is referred by Lennie Odor, PA, for hyperthyroidism.  Pt reports she was dx'ed with hyperthyroidism in 2019.  she has never been on therapy for this.  she has never had XRT to the anterior neck, or thyroid surgery.  she does not consume non-prescribed thyroid medication.  she has never been on amiodarone.  pt states she feels well in general. Past Medical History:  Diagnosis Date   CHF (congestive heart failure) ([place])    Dysrhythmia    PVC's   Hypertension    Moderate mitral regurgitation    moderate to severe MR with moderate pulmonary HTN   Nonischemic cardiomyopathy (H[facility])    EF 30-35% by echo 2015   Pulmonary HTN ([place])    moderate with PASP 84mmHg by echo 2015   PVC's (premature ventricular contractions)     Past Surgical History:  Procedure Laterality Date   CARDIAC CATHETERIZATION  2003   normal coronary arteries   CARDIAC CATHETERIZATION  2013  Nov   h/o nonischemic DCM EF 35-40% increase LVEDP    HYSTEROSCOPY WITH D & C  12/02/2012   Procedure: DILATATION AND CURETTAGE /HYSTEROSCOPY;  Surgeon: Maeola Sarah. Landry Mellow, MD;  Location: [facility];  Service: Gynecology;  Laterality: N/A;  cervical polypectomy   LEFT AND RIGHT HEART CATHETERIZATION WITH CORONARY ANGIOGRAM N/A 02/15/2015   Procedure: LEFT AND RIGHT HEART CATHETERIZATION WITH CORONARY ANGIOGRAM;  Surgeon: Jettie Booze, MD;  Location: [facility] CATH LAB;  Service: Cardiovascular;  Laterality: N/A;   MITRAL VALVE REPAIR N/A 12/14/2018   Procedure: MITRAL VALVE REPAIR;  Surgeon: Sherren Mocha, MD;  Location: [facility] CV LAB;  Service: Cardiovascular;  Laterality: N/A;   TEE WITHOUT CARDIOVERSION N/A 10/11/2018   Procedure: TRANSESOPHAGEAL ECHOCARDIOGRAM (TEE);  Surgeon: Larey Dresser, MD;  Location[facility] ENDOSCOPY;  Service: Cardiovascular;  Laterality: N/A;    Social History   Socioeconomic History   Marital status:  Single    Spouse name: Not on file   Number of children: Not on file   Years of education: Not on file   Highest education level: Not on file  Occupational History   Occupation: Retired    Comment: [employer], [place]  Tobacco Use   Smoking status: Never   Smokeless tobacco: Never  Vaping Use   Vaping Use: Never used  Substance and Sexual Activity   Alcohol use: No    Alcohol/week: 0.0 standard drinks   Drug use: No   Sexual activity: Not on file  Other Topics Concern   Not on file  Social History Narrative   Not on file   Social Determinants of Health   Financial Resource Strain: Not on file  Food Insecurity: Not on file  Transportation Needs: Not on file  Physical Activity: Not on file  Stress: Not on file  Social Connections: Not on file  Intimate Partner Violence: Not on file    Current Outpatient Medications on File Prior to Visit  Medication Sig Dispense Refill   amoxicillin (AMOXIL) 500 MG tablet Take 4 tablets by mouth one hour prior to dental appointments 8 tablet 11   aspirin 81 MG EC tablet Take 1 tablet (81 mg total) by mouth daily.     Calcium Carbonate-Vitamin D (CALCIUM 600+D PO) Take 1 tablet by mouth daily.     carvedilol (COREG) 25 MG tablet Take 1 tablet (25  mg total) by mouth 2 (two) times daily with a meal. 180 tablet 2   empagliflozin (JARDIANCE) 10 MG TABS tablet Take 1 tablet (10 mg total) by mouth daily before breakfast. 90 tablet 3   ibandronate (BONIVA) 150 MG tablet Take 150 mg by mouth every 30 (thirty) days. Take in the morning with a full glass of water, on an empty stomach, and do not take anything else by mouth or lie down for the next 30 min.     sacubitril-valsartan (ENTRESTO) 49-51 MG Take 1 tablet by mouth 2 (two) times daily. 60 tablet 11   spironolactone (ALDACTONE) 25 MG tablet Take 0.5 tablets (12.5 mg total) by mouth daily. 45 tablet 3   torsemide (DEMADEX) 20 MG tablet Taking 40mg  every other day alternating with 20mg   every other day. 35 tablet 3   No current facility-administered medications on file prior to visit.    No Known Allergies  Family History  Problem Relation Age of Onset   Breast cancer Mother 53   Heart disease Father    Breast cancer Sister 33   Colon cancer Neg Hx    Esophageal cancer Neg Hx    Rectal cancer Neg Hx    Stomach cancer Neg Hx    Thyroid disease Neg Hx     BP 130/74    Pulse 69    Ht 5\' 2"  (1.575 m)    Wt 199 lb 9.6 oz (90.5 kg)    SpO2 94%    BMI 36.51 kg/m    Review of Systems denies weight loss, palpitations, sob, excessive diaphoresis, tremor, anxiety, and heat intolerance.      Objective:   Physical Exam VS: see vs page GEN: no distress HEAD: head: no deformity eyes: no periorbital swelling, no proptosis external nose and ears are normal NECK: thyroid is 5 times normal size on the left, and twice normal size on the right.  Otherwise, no palpable nodule.   CHEST WALL: no deformity LUNGS: clear to auscultation CV: reg rate and rhythm, with coarse syst murmur.  MUSCULOSKELETAL: gait is normal and steady EXTEMITIES: no deformity.  no leg edema NEURO:  readily moves all 4's.  sensation is intact to touch on all 4's.  No tremor.   SKIN:  Normal texture and temperature.  No rash or suspicious lesion is visible.  Not diaphoretic.   NODES:  None palpable at the neck.   PSYCH: alert, well-oriented.  Does not appear anxious nor depressed.       Lab Results  Component Value Date   TSH 0.221 (L) 11/11/2021   T4TOTAL 8.0 02/28/2007   Left lobe bx (2016).  ([place]).    [place] (12/22): 1. Subtle interval enlargement of previously biopsied mass occupying the majority of the left inferior gland. Recommend correlation with prior biopsy results. 2. Greater than 5 year stability of additional scattered thyroid nodules in the isthmus and right gland consistent with benignity  I have reviewed outside records, and summarized: Pt was noted to have MNG,  and referred here.  She was noted to have severe LV dysfunction, which worsened after the death of a child     Assessment & Plan:  MNG, which is hereditary Hyperthyroidism, due to the above.  We discussed rx vs observation.   Dilated cardiomyopathy.  In this setting, she should start tapazole.    Patient Instructions  I have sent a prescription to your pharmacy, to slow the thyroid.  No further testing is needed for  the thyroid lumps.  If ever you have fever while taking methimazole, stop it and call us, even if the reason is obvious, because of the risk of a rare side-effect. It is best to never miss the medication.  However, if you do miss it, next best is to double up the next time. Please come back for a follow-up appointment in 6 weeks

## 2022-01-07 NOTE — Patient Instructions (Signed)
I have sent a prescription to your pharmacy, to slow the thyroid.  No further testing is needed for the thyroid lumps.  If ever you have fever while taking methimazole, stop it and call us, even if the reason is obvious, because of the risk of a rare side-effect. It is best to never miss the medication.  However, if you do miss it, next best is to double up the next time. Please come back for a follow-up appointment in 6 weeks

## 2022-01-09 ENCOUNTER — Other Ambulatory Visit (HOSPITAL_COMMUNITY): Payer: Self-pay

## 2022-01-09 ENCOUNTER — Telehealth (HOSPITAL_COMMUNITY): Payer: Self-pay | Admitting: *Deleted

## 2022-01-09 ENCOUNTER — Other Ambulatory Visit (HOSPITAL_COMMUNITY): Payer: Self-pay | Admitting: *Deleted

## 2022-01-09 MED ORDER — LOKELMA 10 G PO PACK
PACK | ORAL | 0 refills | Status: DC
  Filled 2022-01-09: qty 15, 30d supply, fill #0

## 2022-01-09 NOTE — Telephone Encounter (Signed)
I got one and will call her pharmacy and give them the billing information. After that 30 days is she going to continue on the medication? Would need to look at a long term solution if so.  Thanks

## 2022-01-09 NOTE — Telephone Encounter (Signed)
Yes, speak with Marden Noble the [facility] rep to see what he has to offer.

## 2022-01-09 NOTE — Telephone Encounter (Signed)
We have new free drug cards for [facility], see if she can use those.

## 2022-01-09 NOTE — Telephone Encounter (Signed)
Received a message from pharmacy team "should she stop the spiro since she refuses the [place] and insurance wont approve Lokelma she stated she did not want to take the veltassa in the time between now and when she sees Aundra Dubin"  Routed to [place] for advice

## 2022-01-10 ENCOUNTER — Other Ambulatory Visit (HOSPITAL_COMMUNITY): Payer: Self-pay

## 2022-01-10 DIAGNOSIS — E0590 Thyrotoxicosis, unspecified without thyrotoxic crisis or storm: Secondary | ICD-10-CM | POA: Insufficient documentation

## 2022-02-04 ENCOUNTER — Other Ambulatory Visit (HOSPITAL_COMMUNITY): Payer: Self-pay

## 2022-02-04 NOTE — Telephone Encounter (Signed)
PA for [facility] was denied. Patient would need to try and fail Veltassa. The patient does not prefer Veltassa due to taste. This will be denied upon appeal to insurance most likely. As this is not considered a clinical failure of the medication. The patient was provided a free month supply of Lokelma while we were trying to get PA approved. There are no assistance options for Veltassa. The current 30 day co-pay is $10.35. ? ?Called and left the patient a message.  ? ?Charlann Boxer, CPhT ? ?

## 2022-02-05 ENCOUNTER — Other Ambulatory Visit (HOSPITAL_COMMUNITY): Payer: Self-pay | Admitting: *Deleted

## 2022-02-05 MED ORDER — VELTASSA 8.4 G PO PACK: 8.4000 g | PACK | Freq: Every day | ORAL | 11 refills | Status: DC

## 2022-02-05 NOTE — Telephone Encounter (Signed)
Spoke with patient. Was able to find out she can mix with juice or applesauce to help with taste. Patient prefers applesauce over juice. Agreeable to taking Veltassa again considering this. She does not like to drink it in water. Advised her to call the pharmacy when she was low on Lokelma and switch to [place] once she takes the remaining lokelma she has on hand. ? ?Charlann Boxer, CPhT ? ?

## 2022-02-13 ENCOUNTER — Other Ambulatory Visit: Payer: Self-pay

## 2022-02-13 ENCOUNTER — Ambulatory Visit (HOSPITAL_COMMUNITY)
Admission: RE | Admit: 2022-02-13 | Discharge: 2022-02-13 | Disposition: A | Discharge status: Home / Self Care | Payer: PPO | Source: Ambulatory Visit | Attending: Physician Assistant | Admitting: Physician Assistant

## 2022-02-13 ENCOUNTER — Encounter (HOSPITAL_COMMUNITY): Payer: Self-pay | Admitting: Cardiology

## 2022-02-13 ENCOUNTER — Ambulatory Visit (HOSPITAL_BASED_OUTPATIENT_CLINIC_OR_DEPARTMENT_OTHER)
Admission: RE | Admit: 2022-02-13 | Discharge: 2022-02-13 | Disposition: A | Discharge status: Home / Self Care | Payer: PPO | Source: Ambulatory Visit | Attending: Cardiology | Admitting: Cardiology

## 2022-02-13 VITALS — BP 124/78 | HR 63 | Wt 191.4 lb

## 2022-02-13 DIAGNOSIS — I428 Other cardiomyopathies: Secondary | ICD-10-CM | POA: Diagnosis not present

## 2022-02-13 DIAGNOSIS — N1832 Chronic kidney disease, stage 3b: Secondary | ICD-10-CM | POA: Diagnosis not present

## 2022-02-13 DIAGNOSIS — E669 Obesity, unspecified: Secondary | ICD-10-CM | POA: Diagnosis not present

## 2022-02-13 DIAGNOSIS — Z79899 Other long term (current) drug therapy: Secondary | ICD-10-CM | POA: Insufficient documentation

## 2022-02-13 DIAGNOSIS — R9431 Abnormal electrocardiogram [ECG] [EKG]: Secondary | ICD-10-CM | POA: Insufficient documentation

## 2022-02-13 DIAGNOSIS — I10 Essential (primary) hypertension: Secondary | ICD-10-CM | POA: Diagnosis not present

## 2022-02-13 DIAGNOSIS — I5022 Chronic systolic (congestive) heart failure: Secondary | ICD-10-CM | POA: Diagnosis not present

## 2022-02-13 DIAGNOSIS — Z7982 Long term (current) use of aspirin: Secondary | ICD-10-CM | POA: Diagnosis not present

## 2022-02-13 DIAGNOSIS — Z7984 Long term (current) use of oral hypoglycemic drugs: Secondary | ICD-10-CM | POA: Insufficient documentation

## 2022-02-13 DIAGNOSIS — I509 Heart failure, unspecified: Secondary | ICD-10-CM | POA: Diagnosis not present

## 2022-02-13 DIAGNOSIS — E0520 Thyrotoxicosis with toxic multinodular goiter without thyrotoxic crisis or storm: Secondary | ICD-10-CM | POA: Insufficient documentation

## 2022-02-13 DIAGNOSIS — D6869 Other thrombophilia: Secondary | ICD-10-CM | POA: Diagnosis not present

## 2022-02-13 DIAGNOSIS — I493 Ventricular premature depolarization: Secondary | ICD-10-CM | POA: Diagnosis not present

## 2022-02-13 DIAGNOSIS — M810 Age-related osteoporosis without current pathological fracture: Secondary | ICD-10-CM | POA: Diagnosis not present

## 2022-02-13 DIAGNOSIS — I130 Hypertensive heart and chronic kidney disease with heart failure and stage 1 through stage 4 chronic kidney disease, or unspecified chronic kidney disease: Secondary | ICD-10-CM | POA: Insufficient documentation

## 2022-02-13 DIAGNOSIS — I4891 Unspecified atrial fibrillation: Secondary | ICD-10-CM | POA: Diagnosis not present

## 2022-02-13 DIAGNOSIS — E1122 Type 2 diabetes mellitus with diabetic chronic kidney disease: Secondary | ICD-10-CM | POA: Diagnosis not present

## 2022-02-13 DIAGNOSIS — I052 Rheumatic mitral stenosis with insufficiency: Secondary | ICD-10-CM | POA: Insufficient documentation

## 2022-02-13 DIAGNOSIS — N183 Chronic kidney disease, stage 3 unspecified: Secondary | ICD-10-CM | POA: Diagnosis not present

## 2022-02-13 DIAGNOSIS — I110 Hypertensive heart disease with heart failure: Secondary | ICD-10-CM | POA: Diagnosis not present

## 2022-02-13 DIAGNOSIS — I272 Pulmonary hypertension, unspecified: Secondary | ICD-10-CM | POA: Insufficient documentation

## 2022-02-13 DIAGNOSIS — E039 Hypothyroidism, unspecified: Secondary | ICD-10-CM | POA: Diagnosis not present

## 2022-02-13 DIAGNOSIS — E261 Secondary hyperaldosteronism: Secondary | ICD-10-CM | POA: Diagnosis not present

## 2022-02-13 LAB — ECHOCARDIOGRAM COMPLETE
Area-P 1/2: 3.99 cm2
Calc EF: 43.9 %
MV M vel: 5.33 m/s
MV Peak grad: 113.6 mmHg
MV VTI: 0.86 cm2
Radius: 0.4 cm
S' Lateral: 5 cm
Single Plane A2C EF: 44.7 %
Single Plane A4C EF: 45.2 %

## 2022-02-13 LAB — BASIC METABOLIC PANEL
Anion gap: 9 (ref 5–15)
BUN: 40 mg/dL — ABNORMAL HIGH (ref 8–23)
CO2: 28 mmol/L (ref 22–32)
Calcium: 9.1 mg/dL (ref 8.9–10.3)
Chloride: 104 mmol/L (ref 98–111)
Creatinine, Ser: 1.56 mg/dL — ABNORMAL HIGH (ref 0.44–1.00)
GFR, Estimated: 36 mL/min — ABNORMAL LOW (ref >60)
Glucose, Bld: 108 mg/dL — ABNORMAL HIGH (ref 70–99)
Potassium: 3.8 mmol/L (ref 3.5–5.1)
Sodium: 141 mmol/L (ref 135–145)

## 2022-02-13 LAB — BRAIN NATRIURETIC PEPTIDE: B Natriuretic Peptide: 1276.8 pg/mL — ABNORMAL HIGH (ref 0.0–100.0)

## 2022-02-13 MED ORDER — ENTRESTO 97-103 MG PO TABS: 1.0000 | ORAL_TABLET | Freq: Two times a day (BID) | ORAL | 11 refills | Status: DC

## 2022-02-13 NOTE — Progress Notes (Signed)
?  Echocardiogram ?2D Echocardiogram has been performed. ? ?Vanessa [name] ?02/13/2022, 10:00 AM ?

## 2022-02-13 NOTE — Patient Instructions (Signed)
Medication Changes: ? ?Increase Entresto 97/103 Twice daily ? ? ?Lab Work: ? ?Labs done today, your results will be available in MyChart, we will contact you for abnormal readings. ? ? ?Testing/Procedures: ? ?Repeat blood work in 10 days  ? ?Referrals: ? ?none ? ?Special Instructions // Education: ? ?none ? ?Follow-Up in: 4 months (July 2023) ** call the office in May to arrange your follow up appointment** ? ?At the [facility], you and your health needs are our priority. We have a designated team specialized in the treatment of Heart Failure. This Care Team includes your primary Heart Failure Specialized Cardiologist (physician), Advanced Practice Providers (APPs- Physician Assistants and Nurse Practitioners), and Pharmacist who all work together to provide you with the care you need, when you need it.  ? ?You may see any of the following providers on your designated Care Team at your next follow up: ? ?Dr Glori Bickers ?Dr [name] ?Darrick Grinder, NP ?Lyda Jester, PA ?Jessica Milford,NP ?Marlyce Huge, PA ?Audry Riles, PharmD ? ? ?Please be sure to bring in all your medications bottles to every appointment.  ? ?Need to Contact us: ? ?If you have any questions or concerns before your next appointment please send [place] a message through [place] or call our office at [phone].   ? ?TO LEAVE A MESSAGE FOR THE NURSE SELECT OPTION 2, PLEASE LEAVE A MESSAGE INCLUDING: ?YOUR NAME ?DATE OF BIRTH ?CALL BACK NUMBER ?REASON FOR CALL**this is important as we prioritize the call backs ? ?YOU WILL RECEIVE A CALL BACK THE SAME DAY AS LONG AS YOU CALL BEFORE 4:00 PM ? ? ?

## 2022-02-14 ENCOUNTER — Other Ambulatory Visit (HOSPITAL_COMMUNITY): Payer: Self-pay | Admitting: Cardiology

## 2022-02-15 NOTE — Progress Notes (Signed)
? ?  ? ? ?ID:  Vanessa [name], DOB 19-Apr-1951, MRN [id number]   ?Provider location: [place] [facility] ?Type of Visit: Established patient  ? ?PCP:  Vanessa Odor, PA  ?Cardiologist:   Dr. Aundra Dubin ?  ?History of Present Illness: ?Vanessa [name] is a 71 y.o. with history of nonischemic cardiomyopathy and presents for followup of CHF. Patient has had a low EF recognized since at least 9/14, when echo showed EF 40-45%.  In 07/12/2023, her child died, which led to significant stress.  She developed worsening exertional dyspnea and subsequent echo showed a fall in EF to 25-30%.  She had left and right heart catheterization in 3/16 with no significant coronary disease detected and moderately elevated right and left heart filling pressures along with pulmonary venous hypertension.  Echo was done 12/16: EF 45%, diffuse hypokinesis. She had a repeat echo 11/17 => EF 45% with moderately dilated LV, moderate MR.  Echo 11/18 showed EF stable at 45% with mild LV dilation and mildly decreased RV systolic function, moderate MR.  ? ?Spironolactone was stopped due to elevated K. She apparently did not tolerate Veltassa well.  ? ?Echo was done in 11/19.  EF 30-35% with mild LV hypertrophy, mild-moderately decreased RV systolic function, possible severe MR with restricted posterior leaflet, PASP 50 mmHg, severe LAE.  TEE was done in 11/19 to assess mitral regurgitation.  This showed severe MR with restricted posterior leaflet.  ? ?In 1/20, she had Mitraclip with reduction in MR to 1+.   ? ?She had echo in 9/20 with EF 30-35%, severe LAE, normal RV, s/p Mitraclip with mild-moderate MR and mild MS.  The IVC was dilated, suggesting volume overload.  ? ?She was unable to tolerate Bidil due to lightheadedness.  ? ?Echo in 1/21 showed EF 40-45%, RV normal, biatrial dilation, mild mitral stenosis mean 5 mmHg, mild-moderate MR (s/p Mitraclip).  Echo in 1/22 showed EF 40-45% with moderate LV dilation, normal RV size and systolic  function, severe LAE, s/p Mitraclip with probably mild mitral stenosis (mean gradient 7, MVA 2.2 cm^2 by PHT), moderate MR.  ? ?Echo was done today and reviewed, EF 40-45%, moderate LV dilation, normal RV, normal IVC, s/p MV repair with Mitraclip with mild mitral stenosis mean gradient 7 mmHg, moderate MR.  ? ?Weight is down 1 lb.  She is doing well, working at a daycare still.  No dyspnea walking on flat ground, she does get short of breath walking up a flight of stairs.  No orthopnea/PND.  No lightheadedness.     ? ?ECG (personally reviewed): NSR, LVH with repolarization changes.  ? ?Labs (7/16): K 4.5, creatinine 1.12, BNP 596 ?Labs (8/16): K 4 => 5.1, creatinine 1.29 => 1.5, BNP 388 => 426 ?Labs (9/16): K 4, creatinine 1.08, BNP 502 ?Labs (12/16): K 4.9, creatinine 1.57 ?Labs (5/17): K 5.2, creatinine 1.3 ?Labs (8/17): BNP 152 ?Labs (11/17): K 4.9, creatinine 1.32, BNP 236 ?Labs (8/18): K 4.6, creatinine 1.57 ?Labs (11/18): K 3.8, creatinine 1.29 ?Labs (2/19): K 5.1, creatinine 1.44 ?Labs (9/19): K 3.8, creatinine 1.08 ?Labs (11/19): K 4.3, creatinine 1.3 ?Labs (1/20): K 4.8, creatinine 1.68 ?Labs (3/20): K 4.4, creatinine 1.5 ?Labs (9/20): K 4.4, creatinine 1.77 => 1.48, LDL 109 ?Labs (10/20): K 4.6, creatinine 1.66 ?Labs (12/20): K 4.7, creatinine 1.59 ?Labs (3/21): K 4.5, creatinine 1.73 ?Labs (6/21): K 4.6, creatinine 1.7 ?Labs (10/21): TSH normal, K 4.2, creatinine 1.37 ?Labs (3/22): K 4.7, creatinine 1.44 ?Labs (5/22): K 4.2, creatinine  1.75 ?Labs (9/22): K 4.5, creatinine 2.12 => 1.81 ?Labs (12/22): K 4, creatinine 1.31 ? ?PMH: ?1. Cardiomyopathy: Nonischemic.  Echo (9/14) with EF 40-45%.  Echo (2/16) with EF 25-30%.  Echo (4/16) with EF 30-35%, moderate MR.  Echo (6/16) with EF 40%, moderate LV dilation, moderate diastolic dysfunction, moderate MR, moderate TR, PA systolic pressure 52 mmHg. RHC/LHC (3/16) with no significant CAD, EF 20-25%, 3+ MR; mean RA 13, PA 70/30 mean 45, mean PCWP 31 mmHg, CI 2.2,  PVR 3.1 WU.  Echo (12/16) with EF 45%, diffuse hypokinesis, moderate MR, normal RV size and systolic function.  ?- Echo (11/17): EF 45%, moderately dilated LV, moderate eccentric posterior MR, PASP 42 mmHg, normal RV size and systolic function.  ?- Echo (11/18): EF 45%, mild LV dilation, moderate diastolic dysfunction, normal RV size with mildly decreased systolic function, PASP 50 mmHg, moderate MR.  ?- Echo (11/19): EF 30-35%, mild LV dilation, severe LAE, mild-moderately decreased RV systolic function, possible severe MR with restricted posterior leaflet, PASP 50 mmHg.  ?- Echo (1/20): EF 30-35%, severe LV dilation, s/p Mitraclip with mild MR and mild MS, PASP 48 mmHg.  ?- RHC (1/20, with Mitraclip): mean RA 10, mean LA 18 ?- Echo (2/20): EF 30-35%, s/p mitraclip with mild mitral stenosis, mean MV gradient 5 mmHg, moderate MR, PASP 56 mmHg.  ?- Echo (9/20): EF 30-35%, severe LAE, normal RV, s/p Mitraclip with mild-moderate MR and mild MS.  The IVC was dilated, suggesting volume overload.  ?- Echo (1/21): EF 40-45%, RV normal, biatrial dilation, mild mitral stenosis mean 5 mmHg, mild-moderate MR (s/p Mitraclip).  ?- Echo (1/22): EF 40-45% with moderate LV dilation, normal RV size and systolic function, severe LAE, s/p Mitraclip with probably mild mitral stenosis (mean gradient 7, MVA 2.2 cm^2 by PHT), moderate MR. ?- Echo (3/23): EF 40-45%, moderate LV dilation, normal RV, normal IVC, s/p MV repair with Mitraclip with mild mitral stenosis mean gradient 7 mmHg, moderate MR.  ?2. PVCs: Holter (10/15) with 12% PVCs. ?3. Pulmonary venous hypertension: CTA chest (7/16) negative for PE. PFTs (7/16) with minimal obstruction, mild restriction, moderately decreased DLCO.  ?4. Thyroid nodules: Biopsy benign.  ?5. CKD stage 3 ?6. Mitral regurgitation: TEE (11/19) with EF 30-35%, moderate LV dilation, severe MR with posterior leaflet restriction and ERO 0.41 cm^2, no mitral stenosis, mild to moderately decreased RV systolic  function, peak RV-RA gradient 50 mmHg.  ?- Mitraclip placement x 2 in 1/20 with 1+ residual MR.  ?- Echo (2/20) with moderate residual MR, mild MS with mean gradient 5 mmHg.  ?- Echo (9/20) with mild-moderate MR, mild MS.  ?- Echo (1/21) with mild-moderate MR, mild MS s/p Mitraclip ?- Echo (1/22) with moderate residual MR, mild-moderate mitral stenosis.  ?- Echo (3/23) with moderate MR, mild MS s/p Mitraclip.  ?7. Hyperthyroism: Toxic multinodular goiter.  ? ?SH: Lives alone, only child passed away in 2023/06/18, nonsmoker, no ETOH or drugs.  ? ?FH: Father with MIs ? ?ROS: All systems reviewed and negative except as per HPI.  ? ?Current Outpatient Medications  ?Medication Sig Dispense Refill  ? amoxicillin (AMOXIL) 500 MG tablet Take 4 tablets by mouth one hour prior to dental appointments 8 tablet 11  ? aspirin 81 MG EC tablet Take 1 tablet (81 mg total) by mouth daily.    ? Calcium Carbonate-Vitamin D (CALCIUM 600+D PO) Take 1 tablet by mouth daily.    ? carvedilol (COREG) 25 MG tablet Take 1 tablet (25 mg total) by  mouth 2 (two) times daily with a meal. 180 tablet 2  ? empagliflozin (JARDIANCE) 10 MG TABS tablet Take 1 tablet (10 mg total) by mouth daily before breakfast. 90 tablet 3  ? ibandronate (BONIVA) 150 MG tablet Take 150 mg by mouth every 30 (thirty) days. Take in the morning with a full glass of water, on an empty stomach, and do not take anything else by mouth or lie down for the next 30 min.    ? methimazole (TAPAZOLE) 5 MG tablet Take 1 tablet (5 mg total) by mouth 3 (three) times a week. 40 tablet 1  ? patiromer (VELTASSA) 8.4 g packet Take 1 packet (8.4 g total) by mouth daily. 30 each 11  ? sacubitril-valsartan (ENTRESTO) 97-103 MG Take 1 tablet by mouth 2 (two) times daily. 60 tablet 11  ? spironolactone (ALDACTONE) 25 MG tablet Take 0.5 tablets (12.5 mg total) by mouth daily. 45 tablet 3  ? torsemide (DEMADEX) 20 MG tablet Taking 40mg  every other day alternating with 20mg  every other day. 35 tablet 3   ? ?No current facility-administered medications for this encounter.  ? ?BP 124/78   Pulse 63   Wt 86.8 kg (191 lb 6.4 oz)   SpO2 98%   BMI 35.01 kg/m?   ? ?Wt Readings from Last 3 Encounters:  ?03/17/2

## 2022-02-20 ENCOUNTER — Other Ambulatory Visit: Payer: Self-pay

## 2022-02-20 ENCOUNTER — Ambulatory Visit: Payer: PPO | Admitting: Endocrinology

## 2022-02-20 ENCOUNTER — Encounter: Payer: Self-pay | Admitting: Endocrinology

## 2022-02-20 VITALS — BP 124/78 | HR 77 | Ht 62.0 in | Wt 194.2 lb

## 2022-02-20 DIAGNOSIS — E0590 Thyrotoxicosis, unspecified without thyrotoxic crisis or storm: Secondary | ICD-10-CM

## 2022-02-20 NOTE — Patient Instructions (Addendum)
Blood tests are requested for you today.  We'll let you know about the results.    ?If ever you have fever while taking methimazole, stop it and call us, even if the reason is obvious, because of the risk of a rare side-effect. ?It is best to never miss the medication.  However, if you do miss it, next best is to double up the next time.   ?Please come back for a follow-up appointment in 3 months.    ? ?

## 2022-02-20 NOTE — Progress Notes (Signed)
? ?Subjective:  ? ? Patient ID: Vanessa [name], female    DOB: 03/31/1951, 71 y.o.   MRN: [id number] ? ?HPI ?Pt returns for f/u of hyperthyroidism (dx'ed with MNG in 2016 (bx was cat 2), and mild hyperthyroidism in 2019; due to dilated cardiomyopathy, she was rx'ed tapazole; f/u US in 2022 was not significantly changed).  pt states she feels no different, and well in general. She takes tapazole as rx'ed.  ?Past Medical History:  ?Diagnosis Date  ? CHF (congestive heart failure) ([place])   ? Dysrhythmia   ? PVC's  ? Hypertension   ? Moderate mitral regurgitation   ? moderate to severe MR with moderate pulmonary HTN  ? Nonischemic cardiomyopathy ([place])   ? EF 30-35% by echo 2015  ? Pulmonary HTN ([place])   ? moderate with PASP 20mmHg by echo 2015  ? PVC's (premature ventricular contractions)   ? ? ?Past Surgical History:  ?Procedure Laterality Date  ? CARDIAC CATHETERIZATION  2003  ? normal coronary arteries  ? CARDIAC CATHETERIZATION  2013  Nov  ? h/o nonischemic DCM EF 35-40% increase LVEDP   ? HYSTEROSCOPY WITH D & C  12/02/2012  ? Procedure: DILATATION AND CURETTAGE /HYSTEROSCOPY;  Surgeon: Maeola Sarah. Landry Mellow, MD;  Location: [facility];  Service: Gynecology;  Laterality: N/A;  cervical polypectomy  ? LEFT AND RIGHT HEART CATHETERIZATION WITH CORONARY ANGIOGRAM N/A 02/15/2015  ? Procedure: LEFT AND RIGHT HEART CATHETERIZATION WITH CORONARY ANGIOGRAM;  Surgeon: Jettie Booze, MD;  Location: [facility] CATH LAB;  Service: Cardiovascular;  Laterality: N/A;  ? MITRAL VALVE REPAIR N/A 12/14/2018  ? Procedure: MITRAL VALVE REPAIR;  Surgeon: Sherren Mocha, MD;  Location: [facility] CV LAB;  Service: Cardiovascular;  Laterality: N/A;  ? TEE WITHOUT CARDIOVERSION N/A 10/11/2018  ? Procedure: TRANSESOPHAGEAL ECHOCARDIOGRAM (TEE);  Surgeon: Larey Dresser, MD;  Location: [facility] ENDOSCOPY;  Service: Cardiovascular;  Laterality: N/A;  ? ? ?Social History  ? ?Socioeconomic History  ? Marital status: Single  ?  Spouse name: Not on file  ? Number of  children: Not on file  ? Years of education: Not on file  ? Highest education level: Not on file  ?Occupational History  ? Occupation: Retired  ?  Comment: Textile, [employer]  ?Tobacco Use  ? Smoking status: Never  ? Smokeless tobacco: Never  ?Vaping Use  ? Vaping Use: Never used  ?Substance and Sexual Activity  ? Alcohol use: No  ?  Alcohol/week: 0.0 standard drinks  ? Drug use: No  ? Sexual activity: Not on file  ?Other Topics Concern  ? Not on file  ?Social History Narrative  ? Not on file  ? ?Social Determinants of Health  ? ?Financial Resource Strain: Not on file  ?Food Insecurity: Not on file  ?Transportation Needs: Not on file  ?Physical Activity: Not on file  ?Stress: Not on file  ?Social Connections: Not on file  ?Intimate Partner Violence: Not on file  ? ? ?Current Outpatient Medications on File Prior to Visit  ?Medication Sig Dispense Refill  ? amoxicillin (AMOXIL) 500 MG tablet Take 4 tablets by mouth one hour prior to dental appointments 8 tablet 11  ? aspirin 81 MG EC tablet Take 1 tablet (81 mg total) by mouth daily.    ? Calcium Carbonate-Vitamin D (CALCIUM 600+D PO) Take 1 tablet by mouth daily.    ? carvedilol (COREG) 25 MG tablet TAKE 1 TABLET BY MOUTH TWICE DAILY WITH A MEAL 180 tablet 0  ?  empagliflozin (JARDIANCE) 10 MG TABS tablet Take 1 tablet (10 mg total) by mouth daily before breakfast. 90 tablet 3  ? ibandronate (BONIVA) 150 MG tablet Take 150 mg by mouth every 30 (thirty) days. Take in the morning with a full glass of water, on an empty stomach, and do not take anything else by mouth or lie down for the next 30 min.    ? methimazole (TAPAZOLE) 5 MG tablet Take 1 tablet (5 mg total) by mouth 3 (three) times a week. 40 tablet 1  ? patiromer (VELTASSA) 8.4 g packet Take 1 packet (8.4 g total) by mouth daily. 30 each 11  ? sacubitril-valsartan (ENTRESTO) 97-103 MG Take 1 tablet by mouth 2 (two) times daily. 60 tablet 11  ? spironolactone (ALDACTONE) 25 MG tablet Take 0.5  tablets (12.5 mg total) by mouth daily. 45 tablet 3  ? torsemide (DEMADEX) 20 MG tablet Taking 40mg  every other day alternating with 20mg  every other day. 35 tablet 3  ? ?No current facility-administered medications on file prior to visit.  ? ? ?No Known Allergies ? ?Family History  ?Problem Relation Age of Onset  ? Breast cancer Mother 42  ? Heart disease Father   ? Breast cancer Sister 41  ? Colon cancer Neg Hx   ? Esophageal cancer Neg Hx   ? Rectal cancer Neg Hx   ? Stomach cancer Neg Hx   ? Thyroid disease Neg Hx   ? ? ?BP 124/78 (BP Location: Left Arm, Patient Position: Sitting, Cuff Size: Normal)   Pulse 77   Ht 5\' 2"  (1.575 m)   Wt 194 lb 3.2 oz (88.1 kg)   SpO2 95%   BMI 35.52 kg/m?  ? ? ?Review of Systems ?Denies fever.  ?   ?Objective:  ? Physical Exam ?VITAL SIGNS:  See vs page ?GENERAL: no distress ?NECK: thyroid is 10x normal size (L>>>R) ? ?Lab Results  ?Component Value Date  ? TSH 0.67 02/20/2022  ? T4TOTAL 8.0 02/28/2007  ? ?   ?Assessment & Plan:  ?Hyperthyroidism: well-controlled.  Please continue the same methimazole ?MNG: clinically stable.  We'll follow ? ?

## 2022-02-23 ENCOUNTER — Other Ambulatory Visit: Payer: Self-pay

## 2022-02-23 ENCOUNTER — Ambulatory Visit (HOSPITAL_COMMUNITY)
Admission: RE | Admit: 2022-02-23 | Discharge: 2022-02-23 | Disposition: A | Discharge status: Home / Self Care | Payer: PPO | Source: Ambulatory Visit | Attending: Cardiology | Admitting: Cardiology

## 2022-02-23 DIAGNOSIS — I5022 Chronic systolic (congestive) heart failure: Secondary | ICD-10-CM

## 2022-02-23 LAB — BASIC METABOLIC PANEL
Anion gap: 8 (ref 5–15)
BUN: 43 mg/dL — ABNORMAL HIGH (ref 8–23)
CO2: 29 mmol/L (ref 22–32)
Calcium: 8.8 mg/dL — ABNORMAL LOW (ref 8.9–10.3)
Chloride: 103 mmol/L (ref 98–111)
Creatinine, Ser: 1.75 mg/dL — ABNORMAL HIGH (ref 0.44–1.00)
GFR, Estimated: 31 mL/min — ABNORMAL LOW (ref >60)
Glucose, Bld: 141 mg/dL — ABNORMAL HIGH (ref 70–99)
Potassium: 4.2 mmol/L (ref 3.5–5.1)
Sodium: 140 mmol/L (ref 135–145)

## 2022-02-24 LAB — T4, FREE: Free T4: 1.19 ng/dL (ref 0.60–1.60)

## 2022-02-24 LAB — TSH: TSH: 0.67 u[IU]/mL (ref 0.35–5.50)

## 2022-03-17 ENCOUNTER — Other Ambulatory Visit (HOSPITAL_COMMUNITY): Payer: Self-pay | Admitting: Cardiology

## 2022-03-17 DIAGNOSIS — H524 Presbyopia: Secondary | ICD-10-CM | POA: Diagnosis not present

## 2022-03-17 DIAGNOSIS — H25013 Cortical age-related cataract, bilateral: Secondary | ICD-10-CM | POA: Diagnosis not present

## 2022-04-29 ENCOUNTER — Other Ambulatory Visit: Payer: Self-pay | Admitting: Endocrinology

## 2022-04-29 DIAGNOSIS — E0590 Thyrotoxicosis, unspecified without thyrotoxic crisis or storm: Secondary | ICD-10-CM

## 2022-04-30 ENCOUNTER — Other Ambulatory Visit (INDEPENDENT_AMBULATORY_CARE_PROVIDER_SITE_OTHER): Payer: PPO

## 2022-04-30 DIAGNOSIS — E0590 Thyrotoxicosis, unspecified without thyrotoxic crisis or storm: Secondary | ICD-10-CM | POA: Diagnosis not present

## 2022-04-30 LAB — TSH: TSH: 1.22 u[IU]/mL (ref 0.35–5.50)

## 2022-04-30 LAB — T4, FREE: Free T4: 1.42 ng/dL (ref 0.60–1.60)

## 2022-05-04 NOTE — Progress Notes (Addendum)
Patient ID: Vanessa [name], female   DOB: July 26, 1951, 71 y.o.   MRN: [id number]           Reason for Appointment: Goiter, hyperthyroid state    History of Present Illness:    The patient's thyroid enlargement was first discovered in 2016 by her PCP Ultrasound had shown multiple nodules and the dominant left-sided nodule was benign on biopsy  She has had no difficulty with swallowing and no pressure sensation in the neck  Follow-up ultrasound in 12/22 was stable  Her free T4 was initially found to be elevated in 08/2020 but not treated since apparently T3 was normal  Patient still does not think she has had any symptoms at baseline such as weight loss, palpitations, shakiness or heat intolerance  Lab Results  Component Value Date   FREET4 1.42 04/30/2022   FREET4 1.19 02/20/2022   FREET4 1.55 (H) 11/11/2021   TSH 1.22 04/30/2022   TSH 0.67 02/20/2022   TSH 0.221 (L) 11/11/2021   Her TSH previously was in 2008 at 0.26 However her free T4 levels have been documented only since 2019 with highest level 1.55 concomitant with TSH of 0.22  Methimazole was started in 2/23  Currently on only low-dose methimazole 3 days a week  Allergies as of 05/05/2022   No Known Allergies      Medication List        Accurate as of May 05, 2022  9:02 AM. If you have any questions, ask your nurse or doctor.          amoxicillin 500 MG tablet Commonly known as: AMOXIL Take 4 tablets by mouth one hour prior to dental appointments   aspirin EC 81 MG tablet Take 1 tablet (81 mg total) by mouth daily.   CALCIUM 600+D PO Take 1 tablet by mouth daily.   carvedilol 25 MG tablet Commonly known as: COREG TAKE 1 TABLET BY MOUTH TWICE DAILY WITH A MEAL   Entresto 97-103 MG Generic drug: sacubitril-valsartan Take 1 tablet by mouth 2 (two) times daily.   ibandronate 150 MG tablet Commonly known as: BONIVA Take 150 mg by mouth every 30 (thirty) days. Take in the morning with a full glass  of water, on an empty stomach, and do not take anything else by mouth or lie down for the next 30 min.   Jardiance 10 MG Tabs tablet Generic drug: empagliflozin TAKE 1 TABLET BY MOUTH ONCE DAILY BEFORE BREAKFAST   methimazole 5 MG tablet Commonly known as: TAPAZOLE Take 1 tablet (5 mg total) by mouth 3 (three) times a week.   spironolactone 25 MG tablet Commonly known as: ALDACTONE Take 0.5 tablets (12.5 mg total) by mouth daily.   torsemide 20 MG tablet Commonly known as: DEMADEX TAKE 2 TABLETS BY MOUTH EVERY OTHER DAY ALTERNATING WITH 1 TABLET EVERY OTHER DAY   Veltassa 8.4 g packet Generic drug: patiromer Take 1 packet (8.4 g total) by mouth daily.        Allergies: No Known Allergies  Past Medical History:  Diagnosis Date   CHF (congestive heart failure) (HCC)    Dysrhythmia    PVC's   Hypertension    Moderate mitral regurgitation    moderate to severe MR with moderate pulmonary HTN   Nonischemic cardiomyopathy ([facility])    EF 30-35% by echo 2015   Pulmonary HTN ([facility])    moderate with PASP 49mmHg by echo 2015   PVC's (premature ventricular contractions)     Past Surgical History:  Procedure Laterality Date   CARDIAC CATHETERIZATION  2003   normal coronary arteries   CARDIAC CATHETERIZATION  2013  Nov   h/o nonischemic DCM EF 35-40% increase LVEDP    HYSTEROSCOPY WITH D & C  12/02/2012   Procedure: DILATATION AND CURETTAGE /HYSTEROSCOPY;  Surgeon: Maeola Sarah. Landry Mellow, MD;  Location: [facility];  Service: Gynecology;  Laterality: N/A;  cervical polypectomy   LEFT AND RIGHT HEART CATHETERIZATION WITH CORONARY ANGIOGRAM N/A 02/15/2015   Procedure: LEFT AND RIGHT HEART CATHETERIZATION WITH CORONARY ANGIOGRAM;  Surgeon: Jettie Booze, MD;  Location: [facility] CATH LAB;  Service: Cardiovascular;  Laterality: N/A;   MITRAL VALVE REPAIR N/A 12/14/2018   Procedure: MITRAL VALVE REPAIR;  Surgeon: Sherren Mocha, MD;  Location: [facility];  Service: Cardiovascular;  Laterality:  N/A;   TEE WITHOUT CARDIOVERSION N/A 10/11/2018   Procedure: TRANSESOPHAGEAL ECHOCARDIOGRAM (TEE);  Surgeon: Larey Dresser, MD;  Location: [facility] ENDOSCOPY;  Service: Cardiovascular;  Laterality: N/A;    Family History  Problem Relation Age of Onset   Breast cancer Mother 40   Heart disease Father    Breast cancer Sister 78   Colon cancer Neg Hx    Esophageal cancer Neg Hx    Rectal cancer Neg Hx    Stomach cancer Neg Hx    Thyroid disease Neg Hx     Social History:  reports that she has never smoked. She has never used smokeless tobacco. She reports that she does not drink alcohol and does not use drugs.   Review of Systems    Examination:   BP 122/72   Pulse 68   Ht 5\' 3"  (1.6 m)   Wt 191 lb 12.8 oz (87 kg)   SpO2 98%   BMI 33.98 kg/m    General Appearance: pleasant,          Eyes: Mild prominence bilaterally but no stare or swelling.           Neck: The thyroid is enlarged significantly especially left side. Neck circumference is 38 cm over the mid thyroid Left thyroid lobe is 5-6 times normal size, slightly firm but no distinct nodule found Right lobe is softer and about 3-4 times normal size, no distinct nodules felt  There is no stridor.   There is no lymphadenopathy.     Cardiovascular: Normal  heart sounds, faint systolic ejection murmur soft present over the precordium  Respiratory:  Lungs clear Neurological: REFLEXES: at biceps are brisk.  No tremor .  Skin: no rash        Assessment/Plan:  MULTINODULAR goiter with questionable hyperthyroidism as of 12/22 Appears to have been asymptomatic at the time of diagnosis and was diagnosed incidentally when tested by cardiologist Although her free T4 was slightly higher at the time of diagnosis her TSH was not suppressed confirming hyperthyroidism  Thyroid scan not available  Currently on only low-dose methimazole 3 days a week Today looks euthyroid and although free T4 is relatively higher than before  her TSH is still normal  Discussed in detail the etiology of her disease being a toxic nodular goiter or possible hot nodule This should be amenable to radioactive iodine treatment which will also provide long-term control and avoid any long-term medications This may also reduce his thyroid for goiter Although she was somewhat hesitant about doing this explained to her the details and the safety of I-131 in the process including getting a scan and uptake before back She wants to do this sometime  next month Reminded her to stop her methimazole a week before the test is scheduled Follow-up 1 month after the treatment  Total visit time including counseling and review of her relevant complete record = 30 minutes  Elayne Snare 05/05/2022  Addendum: No hot nodule present with only likely a warm nodule on the right side and uptake 28.8%. Will follow without any treatment for now, stop methimazole  Elayne Snare

## 2022-05-05 ENCOUNTER — Ambulatory Visit: Payer: PPO | Admitting: Endocrinology

## 2022-05-05 ENCOUNTER — Encounter: Payer: Self-pay | Admitting: Endocrinology

## 2022-05-05 VITALS — BP 122/72 | HR 68 | Ht 63.0 in | Wt 191.8 lb

## 2022-05-05 DIAGNOSIS — E0590 Thyrotoxicosis, unspecified without thyrotoxic crisis or storm: Secondary | ICD-10-CM | POA: Diagnosis not present

## 2022-05-05 DIAGNOSIS — E042 Nontoxic multinodular goiter: Secondary | ICD-10-CM | POA: Diagnosis not present

## 2022-05-05 NOTE — Patient Instructions (Signed)
When you are called by nuclear medicine to schedule the thyroid uptake and scan stop the methimazole a week before Do not start the methimazole unless we instruct to Meanwhile continue to take the methimazole as before

## 2022-05-07 ENCOUNTER — Other Ambulatory Visit (HOSPITAL_COMMUNITY): Payer: Self-pay | Admitting: Cardiology

## 2022-06-15 ENCOUNTER — Other Ambulatory Visit: Payer: Self-pay | Admitting: Family Medicine

## 2022-06-15 ENCOUNTER — Encounter (HOSPITAL_COMMUNITY)
Admission: RE | Admit: 2022-06-15 | Discharge: 2022-06-15 | Disposition: A | Discharge status: Home / Self Care | Payer: PPO | Source: Ambulatory Visit | Attending: Endocrinology | Admitting: Endocrinology

## 2022-06-15 DIAGNOSIS — E0590 Thyrotoxicosis, unspecified without thyrotoxic crisis or storm: Secondary | ICD-10-CM | POA: Insufficient documentation

## 2022-06-15 DIAGNOSIS — Z1231 Encounter for screening mammogram for malignant neoplasm of breast: Secondary | ICD-10-CM

## 2022-06-15 MED ORDER — SODIUM IODIDE I-123 7.4 MBQ CAPS
431.0000 | ORAL_CAPSULE | Freq: Once | ORAL | Status: AC
  Administered 2022-06-15: 431 via ORAL

## 2022-06-16 ENCOUNTER — Encounter (HOSPITAL_COMMUNITY)
Admission: RE | Admit: 2022-06-16 | Discharge: 2022-06-16 | Disposition: A | Discharge status: Home / Self Care | Payer: PPO | Source: Ambulatory Visit | Attending: Endocrinology | Admitting: Endocrinology

## 2022-06-16 DIAGNOSIS — E042 Nontoxic multinodular goiter: Secondary | ICD-10-CM | POA: Diagnosis not present

## 2022-06-21 ENCOUNTER — Other Ambulatory Visit (HOSPITAL_COMMUNITY): Payer: Self-pay | Admitting: Cardiology

## 2022-07-06 ENCOUNTER — Other Ambulatory Visit: Payer: Self-pay

## 2022-07-08 ENCOUNTER — Ambulatory Visit (HOSPITAL_COMMUNITY)
Admission: RE | Admit: 2022-07-08 | Discharge: 2022-07-08 | Disposition: A | Discharge status: Home / Self Care | Payer: PPO | Source: Ambulatory Visit | Attending: Cardiology | Admitting: Cardiology

## 2022-07-08 ENCOUNTER — Encounter (HOSPITAL_COMMUNITY): Payer: Self-pay | Admitting: Cardiology

## 2022-07-08 ENCOUNTER — Telehealth (HOSPITAL_COMMUNITY): Payer: Self-pay | Admitting: *Deleted

## 2022-07-08 VITALS — BP 90/50 | HR 66 | Wt 183.6 lb

## 2022-07-08 DIAGNOSIS — I428 Other cardiomyopathies: Secondary | ICD-10-CM | POA: Insufficient documentation

## 2022-07-08 DIAGNOSIS — Z7984 Long term (current) use of oral hypoglycemic drugs: Secondary | ICD-10-CM | POA: Insufficient documentation

## 2022-07-08 DIAGNOSIS — R0609 Other forms of dyspnea: Secondary | ICD-10-CM | POA: Insufficient documentation

## 2022-07-08 DIAGNOSIS — I110 Hypertensive heart disease with heart failure: Secondary | ICD-10-CM | POA: Diagnosis not present

## 2022-07-08 DIAGNOSIS — E0520 Thyrotoxicosis with toxic multinodular goiter without thyrotoxic crisis or storm: Secondary | ICD-10-CM | POA: Insufficient documentation

## 2022-07-08 DIAGNOSIS — R5383 Other fatigue: Secondary | ICD-10-CM | POA: Insufficient documentation

## 2022-07-08 DIAGNOSIS — I052 Rheumatic mitral stenosis with insufficiency: Secondary | ICD-10-CM | POA: Diagnosis not present

## 2022-07-08 DIAGNOSIS — Z79899 Other long term (current) drug therapy: Secondary | ICD-10-CM | POA: Insufficient documentation

## 2022-07-08 DIAGNOSIS — R42 Dizziness and giddiness: Secondary | ICD-10-CM | POA: Diagnosis not present

## 2022-07-08 DIAGNOSIS — I5022 Chronic systolic (congestive) heart failure: Secondary | ICD-10-CM

## 2022-07-08 DIAGNOSIS — I493 Ventricular premature depolarization: Secondary | ICD-10-CM | POA: Diagnosis not present

## 2022-07-08 DIAGNOSIS — I272 Pulmonary hypertension, unspecified: Secondary | ICD-10-CM | POA: Diagnosis not present

## 2022-07-08 LAB — BASIC METABOLIC PANEL
Anion gap: 8 (ref 5–15)
BUN: 91 mg/dL — ABNORMAL HIGH (ref 8–23)
CO2: 20 mmol/L — ABNORMAL LOW (ref 22–32)
Calcium: 9.3 mg/dL (ref 8.9–10.3)
Chloride: 111 mmol/L (ref 98–111)
Creatinine, Ser: 2.76 mg/dL — ABNORMAL HIGH (ref 0.44–1.00)
GFR, Estimated: 18 mL/min — ABNORMAL LOW (ref >60)
Glucose, Bld: 117 mg/dL — ABNORMAL HIGH (ref 70–99)
Potassium: 4.6 mmol/L (ref 3.5–5.1)
Sodium: 139 mmol/L (ref 135–145)

## 2022-07-08 LAB — BRAIN NATRIURETIC PEPTIDE: B Natriuretic Peptide: 975.5 pg/mL — ABNORMAL HIGH (ref 0.0–100.0)

## 2022-07-08 MED ORDER — CARVEDILOL 12.5 MG PO TABS: 18.7500 mg | ORAL_TABLET | Freq: Two times a day (BID) | ORAL | 3 refills | Status: DC

## 2022-07-08 MED ORDER — ENTRESTO 97-103 MG PO TABS: 0.5000 | ORAL_TABLET | Freq: Two times a day (BID) | ORAL | 11 refills | Status: DC

## 2022-07-08 MED ORDER — TORSEMIDE 20 MG PO TABS: 20.0000 mg | ORAL_TABLET | Freq: Every day | ORAL | 0 refills | Status: DC

## 2022-07-08 NOTE — Telephone Encounter (Signed)
Pt aware, agreeable, and verbalized understanding, repeat lab sch 8/14

## 2022-07-08 NOTE — Telephone Encounter (Signed)
-----   Message from Larey Dresser, MD sent at 07/08/2022  4:38 PM EDT ----- Large creatinine rise.  Hold torsemide for 2 days then decrease to 20 mg daily.  Hold Entresto for 2 days then decrease to 49/51 bid.  BMET in Monday or Tuesday.

## 2022-07-08 NOTE — Progress Notes (Addendum)
ID:  Vanessa [name], DOB 05/03/1951, MRN [id number]   Provider location:  Advanced Heart Failure Type of Visit: Established patient   PCP:  Lennie Odor, PA  Cardiologist:   Dr. Aundra Dubin   History of Present Illness: Vanessa [name] is a 71 y.o. with history of nonischemic cardiomyopathy and presents for followup of CHF. Patient has had a low EF recognized since at least 9/14, when echo showed EF 40-45%.  In 07/01/2023, her child died, which led to significant stress.  She developed worsening exertional dyspnea and subsequent echo showed a fall in EF to 25-30%.  She had left and right heart catheterization in 3/16 with no significant coronary disease detected and moderately elevated right and left heart filling pressures along with pulmonary venous hypertension.  Echo was done 12/16: EF 45%, diffuse hypokinesis. She had a repeat echo 11/17 => EF 45% with moderately dilated LV, moderate MR.  Echo 11/18 showed EF stable at 45% with mild LV dilation and mildly decreased RV systolic function, moderate MR.   Spironolactone was stopped due to elevated K. She apparently did not tolerate Veltassa well.   Echo was done in 11/19.  EF 30-35% with mild LV hypertrophy, mild-moderately decreased RV systolic function, possible severe MR with restricted posterior leaflet, PASP 50 mmHg, severe LAE.  TEE was done in 11/19 to assess mitral regurgitation.  This showed severe MR with restricted posterior leaflet.   In 1/20, she had Mitraclip with reduction in MR to 1+.    She had echo in 9/20 with EF 30-35%, severe LAE, normal RV, s/p Mitraclip with mild-moderate MR and mild MS.  The IVC was dilated, suggesting volume overload.   She was unable to tolerate Bidil due to lightheadedness.   Echo in 1/21 showed EF 40-45%, RV normal, biatrial dilation, mild mitral stenosis mean 5 mmHg, mild-moderate MR (s/p Mitraclip).  Echo in 1/22 showed EF 40-45% with moderate LV dilation, normal RV size and systolic  function, severe LAE, s/p Mitraclip with probably mild mitral stenosis (mean gradient 7, MVA 2.2 cm^2 by PHT), moderate MR.   Echo in 3/23 showed EF 40-45%, moderate LV dilation, normal RV, normal IVC, s/p MV repair with Mitraclip with mild mitral stenosis mean gradient 7 mmHg, moderate MR.   Weight is down 8 lbs today.  She has had some lightheadedness with standing for about a month now.  No falls.  She has been more fatigued.  No peripheral edema.  Her breathing is stable, no dyspnea walking on flat ground and doing ADLs.  She still works in a daycare center. No orthopnea/PND. No snoring or daytime sleepiness.   ECG (personally reviewed): NSR, IVCD 122 msec.   Labs (7/16): K 4.5, creatinine 1.12, BNP 596 Labs (8/16): K 4 => 5.1, creatinine 1.29 => 1.5, BNP 388 => 426 Labs (9/16): K 4, creatinine 1.08, BNP 502 Labs (12/16): K 4.9, creatinine 1.57 Labs (5/17): K 5.2, creatinine 1.3 Labs (8/17): BNP 152 Labs (11/17): K 4.9, creatinine 1.32, BNP 236 Labs (8/18): K 4.6, creatinine 1.57 Labs (11/18): K 3.8, creatinine 1.29 Labs (2/19): K 5.1, creatinine 1.44 Labs (9/19): K 3.8, creatinine 1.08 Labs (11/19): K 4.3, creatinine 1.3 Labs (1/20): K 4.8, creatinine 1.68 Labs (3/20): K 4.4, creatinine 1.5 Labs (9/20): K 4.4, creatinine 1.77 => 1.48, LDL 109 Labs (10/20): K 4.6, creatinine 1.66 Labs (12/20): K 4.7, creatinine 1.59 Labs (3/21): K 4.5, creatinine 1.73 Labs (6/21): K 4.6, creatinine 1.7 Labs (10/21): TSH normal,  K 4.2, creatinine 1.37 Labs (3/22): K 4.7, creatinine 1.44 Labs (5/22): K 4.2, creatinine 1.75 Labs (9/22): K 4.5, creatinine 2.12 => 1.81 Labs (12/22): K 4, creatinine 1.31 Labs (3/23): K 4.2, creatinine 1.75, BNP 1276 Labs (6/23): TSH normal  PMH: 1. Cardiomyopathy: Nonischemic.  Echo (9/14) with EF 40-45%.  Echo (2/16) with EF 25-30%.  Echo (4/16) with EF 30-35%, moderate MR.  Echo (6/16) with EF 40%, moderate LV dilation, moderate diastolic dysfunction, moderate MR,  moderate TR, PA systolic pressure 52 mmHg. RHC/LHC (3/16) with no significant CAD, EF 20-25%, 3+ MR; mean RA 13, PA 70/30 mean 45, mean PCWP 31 mmHg, CI 2.2, PVR 3.1 WU.  Echo (12/16) with EF 45%, diffuse hypokinesis, moderate MR, normal RV size and systolic function.  - Echo (11/17): EF 45%, moderately dilated LV, moderate eccentric posterior MR, PASP 42 mmHg, normal RV size and systolic function.  - Echo (11/18): EF 45%, mild LV dilation, moderate diastolic dysfunction, normal RV size with mildly decreased systolic function, PASP 50 mmHg, moderate MR.  - Echo (11/19): EF 30-35%, mild LV dilation, severe LAE, mild-moderately decreased RV systolic function, possible severe MR with restricted posterior leaflet, PASP 50 mmHg.  - Echo (1/20): EF 30-35%, severe LV dilation, s/p Mitraclip with mild MR and mild MS, PASP 48 mmHg.  - RHC (1/20, with Mitraclip): mean RA 10, mean LA 18 - Echo (2/20): EF 30-35%, s/p mitraclip with mild mitral stenosis, mean MV gradient 5 mmHg, moderate MR, PASP 56 mmHg.  - Echo (9/20): EF 30-35%, severe LAE, normal RV, s/p Mitraclip with mild-moderate MR and mild MS.  The IVC was dilated, suggesting volume overload.  - Echo (1/21): EF 40-45%, RV normal, biatrial dilation, mild mitral stenosis mean 5 mmHg, mild-moderate MR (s/p Mitraclip).  - Echo (1/22): EF 40-45% with moderate LV dilation, normal RV size and systolic function, severe LAE, s/p Mitraclip with probably mild mitral stenosis (mean gradient 7, MVA 2.2 cm^2 by PHT), moderate MR. - Echo (3/23): EF 40-45%, moderate LV dilation, normal RV, normal IVC, s/p MV repair with Mitraclip with mild mitral stenosis mean gradient 7 mmHg, moderate MR.  2. PVCs: Holter (10/15) with 12% PVCs. 3. Pulmonary venous hypertension: CTA chest (7/16) negative for PE. PFTs (7/16) with minimal obstruction, mild restriction, moderately decreased DLCO.  4. Thyroid nodules: Biopsy benign.  5. CKD stage 3 6. Mitral regurgitation: TEE (11/19) with  EF 30-35%, moderate LV dilation, severe MR with posterior leaflet restriction and ERO 0.41 cm^2, no mitral stenosis, mild to moderately decreased RV systolic function, peak RV-RA gradient 50 mmHg.  - Mitraclip placement x 2 in 1/20 with 1+ residual MR.  - Echo (2/20) with moderate residual MR, mild MS with mean gradient 5 mmHg.  - Echo (9/20) with mild-moderate MR, mild MS.  - Echo (1/21) with mild-moderate MR, mild MS s/p Mitraclip - Echo (1/22) with moderate residual MR, mild-moderate mitral stenosis.  - Echo (3/23) with moderate MR, mild MS s/p Mitraclip.  7. Hyperthyroism: Toxic multinodular goiter.   SH: Lives alone, only child passed away in 18-Jun-2023, nonsmoker, no ETOH or drugs.   FH: Father with MIs  ROS: All systems reviewed and negative except as per HPI.   Current Outpatient Medications  Medication Sig Dispense Refill   amoxicillin (AMOXIL) 500 MG tablet Take 4 tablets by mouth one hour prior to dental appointments 8 tablet 11   aspirin 81 MG EC tablet Take 1 tablet (81 mg total) by mouth daily.     Calcium  Carbonate-Vitamin D (CALCIUM 600+D PO) Take 1 tablet by mouth daily.     empagliflozin (JARDIANCE) 10 MG TABS tablet TAKE 1 TABLET BY MOUTH ONCE DAILY BEFORE BREAKFAST 90 tablet 3   ibandronate (BONIVA) 150 MG tablet Take 150 mg by mouth every 30 (thirty) days. Take in the morning with a full glass of water, on an empty stomach, and do not take anything else by mouth or lie down for the next 30 min.     patiromer (VELTASSA) 8.4 g packet Take 1 packet (8.4 g total) by mouth daily. 30 each 11   spironolactone (ALDACTONE) 25 MG tablet Take 0.5 tablets (12.5 mg total) by mouth daily. 45 tablet 3   carvedilol (COREG) 12.5 MG tablet Take 1.5 tablets (18.75 mg total) by mouth 2 (two) times daily with a meal. 180 tablet 3   sacubitril-valsartan (ENTRESTO) 97-103 MG Take 0.5 tablets by mouth 2 (two) times daily. 60 tablet 11   torsemide (DEMADEX) 20 MG tablet Take 1 tablet (20 mg total) by  mouth daily. 45 tablet 0   No current facility-administered medications for this encounter.   BP (!) 90/50   Pulse 66   Wt 83.3 kg (183 lb 9.6 oz)   SpO2 98%   BMI 32.52 kg/m    Wt Readings from Last 3 Encounters:  07/08/22 83.3 kg (183 lb 9.6 oz)  05/05/22 87 kg (191 lb 12.8 oz)  02/20/22 88.1 kg (194 lb 3.2 oz)   Exam:   General: NAD Neck: No JVD, no thyromegaly or thyroid nodule.  Lungs: Clear to auscultation bilaterally with normal respiratory effort. CV: Nondisplaced PMI.  Heart regular S1/S2, no S3/S4, 1/6 HSM apex.  No peripheral edema.  No carotid bruit.  Normal pedal pulses.  Abdomen: Soft, nontender, no hepatosplenomegaly, no distention.  Skin: Intact without lesions or rashes.  Neurologic: Alert and oriented x 3.  Psych: Normal affect. Extremities: No clubbing or cyanosis.  HEENT: Normal.   Assessment/Plan: 1. Chronic systolic CHF: Nonischemic cardiomyopathy.  EF up to 45% on 12/16, repeat echo 11/18 shows that EF remains about 45%.  Etiology of NICM still unclear. Had frequent PVCs by past holter (12%) but doesn't explain her cardiomyopathy. Had improvement of palpitations on Coreg. Cannot rule out myocarditis. May also have component of Takotsubo with passing of child but EF has not improved as completely as would be expected with Takotsubo. Have previously discussed cardiac MRI to look for infiltrative disease but she says that she would be too claustrophobic.  Echo in 11/19 showed EF down to 30-35% and mild to moderate RV systolic function.  There also appeared to be severe mitral regurgitation, which seemed progressive from the past. TEE in 11/19 confirmed severe MR with restricted posterior leaflet => Mitraclip 1/20 with decrease in MR to 1+.  Echo in 1/20 showed EF 30-35% with mild MR s/p Mitraclip. Echo in 9/20 with stable EF 30-35% but dilated IVC suggestive of volume overload.  Echo in 1/22 with EF 40-45%, normal RV.  Echo in 3/23 showed  EF 40-45%, moderate LV  dilation, normal RV, normal IVC, s/p MV repair with Mitraclip with mild mitral stenosis mean gradient 7 mmHg, moderate MR. NYHA class II, not volume overloaded on exam but more orthostatic symptoms and fatigue.  - Continue torsemide 20 mg daily alternating with 40 mg daily unless creatinine has worsened.   - Continue spironolactone 12.5 mg daily with Veltassa. Check BMET today.  - With orthostatic symptoms and fatigue, will decrease Coreg to 18.75  mg bid.   - Continue Entresto 97/103 bid.  - She did not tolerate Bidil 1/2 tab tid. .  - Continue empagliflozin 10 mg daily.      - EF now above ICD range.   2. PVCs: Frequent (12%) on 10/15 holter.   Pt refused repeat holter monitor for further assessment. PVC count was probably not high enough to cause cardiomyopathy, unlikely primary etiology. - Improved palpitations on Coreg.   3. Pulmonary hypertension: Primarily pulmonary venous HTN (PVR 3.1) from elevated left atrial pressure (previous RHC above).   - Best treatment remains adequate diuresis. 4. Mitral regurgitation:  TEE in 11/19 with severe MR, restricted posterior leaflet.  She had Mitraclip in 1/20. Post-op echo showed mild MR and mild mitral stenosis.  Echo in 1/22 showed moderate MR, mild-moderate MS. Echo in 3/23 showed moderate MR, mild-moderate MS.  - Will need antibiotics for dental prophylaxis.  5. 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   Recommended follow-up:  3 months with APP.    Signed, [name], MD  07/08/2022  [place] [address] [facility] [place] [address] [phone] (office) [phone] (fax)

## 2022-07-08 NOTE — Patient Instructions (Signed)
Decrease Carvedilol to 18.75mg  Twice daily  Labs done today, your results will be available in MyChart, we will contact you for abnormal readings.  Your physician recommends that you schedule a follow-up appointment in: 3 months  If you have any questions or concerns before your next appointment please send [place] a message through [place] or call our office at [phone].    TO LEAVE A MESSAGE FOR THE NURSE SELECT OPTION 2, PLEASE LEAVE A MESSAGE INCLUDING: YOUR NAME DATE OF BIRTH CALL BACK NUMBER REASON FOR CALL**this is important as we prioritize the call backs  YOU WILL RECEIVE A CALL BACK THE SAME DAY AS LONG AS YOU CALL BEFORE 4:00 PM  At the [facility], you and your health needs are our priority. As part of our continuing mission to provide you with exceptional heart care, we have created designated Provider Care Teams. These Care Teams include your primary Cardiologist (physician) and Advanced Practice Providers (APPs- Physician Assistants and Nurse Practitioners) who all work together to provide you with the care you need, when you need it.   You may see any of the following providers on your designated Care Team at your next follow up: Dr Glori Bickers Dr Haynes Kerns, NP Lyda Jester, [place] [facility] [place], [place] Audry Riles, PharmD   Please be sure to bring in all your medications bottles to every appointment.

## 2022-07-13 ENCOUNTER — Telehealth (HOSPITAL_COMMUNITY): Payer: Self-pay

## 2022-07-13 ENCOUNTER — Ambulatory Visit (HOSPITAL_COMMUNITY)
Admission: RE | Admit: 2022-07-13 | Discharge: 2022-07-13 | Disposition: A | Discharge status: Home / Self Care | Payer: PPO | Source: Ambulatory Visit | Attending: Internal Medicine | Admitting: Internal Medicine

## 2022-07-13 DIAGNOSIS — I5022 Chronic systolic (congestive) heart failure: Secondary | ICD-10-CM

## 2022-07-13 LAB — BASIC METABOLIC PANEL
Anion gap: 7 (ref 5–15)
BUN: 80 mg/dL — ABNORMAL HIGH (ref 8–23)
CO2: 19 mmol/L — ABNORMAL LOW (ref 22–32)
Calcium: 8.7 mg/dL — ABNORMAL LOW (ref 8.9–10.3)
Chloride: 112 mmol/L — ABNORMAL HIGH (ref 98–111)
Creatinine, Ser: 1.95 mg/dL — ABNORMAL HIGH (ref 0.44–1.00)
GFR, Estimated: 27 mL/min — ABNORMAL LOW (ref >60)
Glucose, Bld: 133 mg/dL — ABNORMAL HIGH (ref 70–99)
Potassium: 5.1 mmol/L (ref 3.5–5.1)
Sodium: 138 mmol/L (ref 135–145)

## 2022-07-13 NOTE — Telephone Encounter (Addendum)
Pt aware, agreeable, and verbalized understanding   ----- Message from Larey Dresser, MD sent at 07/13/2022  1:29 PM EDT ----- Creatinine improving.  Keep K low in diet.  No changes, repeat BMET 10 days.

## 2022-07-23 ENCOUNTER — Ambulatory Visit
Admission: RE | Admit: 2022-07-23 | Discharge: 2022-07-23 | Disposition: A | Discharge status: Home / Self Care | Payer: PPO | Source: Ambulatory Visit | Attending: Family Medicine | Admitting: Family Medicine

## 2022-07-23 DIAGNOSIS — Z1231 Encounter for screening mammogram for malignant neoplasm of breast: Secondary | ICD-10-CM | POA: Diagnosis not present

## 2022-07-24 ENCOUNTER — Ambulatory Visit (HOSPITAL_COMMUNITY)
Admission: RE | Admit: 2022-07-24 | Discharge: 2022-07-24 | Disposition: A | Discharge status: Home / Self Care | Payer: PPO | Source: Ambulatory Visit | Attending: Cardiology | Admitting: Cardiology

## 2022-07-24 ENCOUNTER — Other Ambulatory Visit: Payer: Self-pay

## 2022-07-24 ENCOUNTER — Inpatient Hospital Stay (HOSPITAL_COMMUNITY)
Admission: EM | Admit: 2022-07-24 | Discharge: 2022-07-26 | DRG: 683 | Disposition: A | Discharge status: Home / Self Care | Payer: PPO | Attending: Internal Medicine | Admitting: Internal Medicine

## 2022-07-24 ENCOUNTER — Encounter (HOSPITAL_COMMUNITY): Payer: Self-pay

## 2022-07-24 DIAGNOSIS — I272 Pulmonary hypertension, unspecified: Secondary | ICD-10-CM | POA: Diagnosis not present

## 2022-07-24 DIAGNOSIS — N189 Chronic kidney disease, unspecified: Secondary | ICD-10-CM | POA: Diagnosis not present

## 2022-07-24 DIAGNOSIS — E861 Hypovolemia: Secondary | ICD-10-CM | POA: Diagnosis not present

## 2022-07-24 DIAGNOSIS — I5023 Acute on chronic systolic (congestive) heart failure: Secondary | ICD-10-CM | POA: Diagnosis present

## 2022-07-24 DIAGNOSIS — Z95818 Presence of other cardiac implants and grafts: Secondary | ICD-10-CM | POA: Diagnosis not present

## 2022-07-24 DIAGNOSIS — I5033 Acute on chronic diastolic (congestive) heart failure: Secondary | ICD-10-CM | POA: Diagnosis present

## 2022-07-24 DIAGNOSIS — I130 Hypertensive heart and chronic kidney disease with heart failure and stage 1 through stage 4 chronic kidney disease, or unspecified chronic kidney disease: Secondary | ICD-10-CM | POA: Diagnosis present

## 2022-07-24 DIAGNOSIS — I5022 Chronic systolic (congestive) heart failure: Secondary | ICD-10-CM | POA: Insufficient documentation

## 2022-07-24 DIAGNOSIS — E875 Hyperkalemia: Secondary | ICD-10-CM | POA: Diagnosis not present

## 2022-07-24 DIAGNOSIS — T502X5A Adverse effect of carbonic-anhydrase inhibitors, benzothiadiazides and other diuretics, initial encounter: Secondary | ICD-10-CM | POA: Diagnosis not present

## 2022-07-24 DIAGNOSIS — E6609 Other obesity due to excess calories: Secondary | ICD-10-CM

## 2022-07-24 DIAGNOSIS — Z7984 Long term (current) use of oral hypoglycemic drugs: Secondary | ICD-10-CM | POA: Diagnosis not present

## 2022-07-24 DIAGNOSIS — N179 Acute kidney failure, unspecified: Principal | ICD-10-CM | POA: Diagnosis present

## 2022-07-24 DIAGNOSIS — Z6833 Body mass index (BMI) 33.0-33.9, adult: Secondary | ICD-10-CM

## 2022-07-24 DIAGNOSIS — Z8249 Family history of ischemic heart disease and other diseases of the circulatory system: Secondary | ICD-10-CM | POA: Diagnosis not present

## 2022-07-24 DIAGNOSIS — I10 Essential (primary) hypertension: Secondary | ICD-10-CM | POA: Diagnosis present

## 2022-07-24 DIAGNOSIS — E860 Dehydration: Secondary | ICD-10-CM | POA: Diagnosis present

## 2022-07-24 DIAGNOSIS — D696 Thrombocytopenia, unspecified: Secondary | ICD-10-CM | POA: Diagnosis not present

## 2022-07-24 DIAGNOSIS — E669 Obesity, unspecified: Secondary | ICD-10-CM | POA: Diagnosis present

## 2022-07-24 DIAGNOSIS — I952 Hypotension due to drugs: Secondary | ICD-10-CM | POA: Diagnosis not present

## 2022-07-24 DIAGNOSIS — N1832 Chronic kidney disease, stage 3b: Secondary | ICD-10-CM | POA: Diagnosis present

## 2022-07-24 DIAGNOSIS — D631 Anemia in chronic kidney disease: Secondary | ICD-10-CM | POA: Diagnosis not present

## 2022-07-24 DIAGNOSIS — I129 Hypertensive chronic kidney disease with stage 1 through stage 4 chronic kidney disease, or unspecified chronic kidney disease: Secondary | ICD-10-CM | POA: Diagnosis not present

## 2022-07-24 DIAGNOSIS — E1122 Type 2 diabetes mellitus with diabetic chronic kidney disease: Secondary | ICD-10-CM | POA: Diagnosis present

## 2022-07-24 LAB — BASIC METABOLIC PANEL
Anion gap: 7 (ref 5–15)
Anion gap: 12 (ref 5–15)
BUN: 117 mg/dL — ABNORMAL HIGH (ref 8–23)
BUN: 120 mg/dL — ABNORMAL HIGH (ref 8–23)
CO2: 16 mmol/L — ABNORMAL LOW (ref 22–32)
CO2: 16 mmol/L — ABNORMAL LOW (ref 22–32)
Calcium: 8.9 mg/dL (ref 8.9–10.3)
Calcium: 9.6 mg/dL (ref 8.9–10.3)
Chloride: 115 mmol/L — ABNORMAL HIGH (ref 98–111)
Chloride: 110 mmol/L (ref 98–111)
Creatinine, Ser: 2.88 mg/dL — ABNORMAL HIGH (ref 0.44–1.00)
Creatinine, Ser: 2.62 mg/dL — ABNORMAL HIGH (ref 0.44–1.00)
GFR, Estimated: 17 mL/min — ABNORMAL LOW (ref >60)
GFR, Estimated: 19 mL/min — ABNORMAL LOW (ref >60)
Glucose, Bld: 111 mg/dL — ABNORMAL HIGH (ref 70–99)
Glucose, Bld: 111 mg/dL — ABNORMAL HIGH (ref 70–99)
Potassium: 6.1 mmol/L — ABNORMAL HIGH (ref 3.5–5.1)
Potassium: 6.8 mmol/L (ref 3.5–5.1)
Sodium: 138 mmol/L (ref 135–145)
Sodium: 138 mmol/L (ref 135–145)

## 2022-07-24 LAB — BRAIN NATRIURETIC PEPTIDE: B Natriuretic Peptide: 393.2 pg/mL — ABNORMAL HIGH (ref 0.0–100.0)

## 2022-07-24 LAB — CBC WITH DIFFERENTIAL/PLATELET
Abs Immature Granulocytes: 0.02 10*3/uL (ref 0.00–0.07)
Basophils Absolute: 0 10*3/uL (ref 0.0–0.1)
Basophils Relative: 1 %
Eosinophils Absolute: 0 10*3/uL (ref 0.0–0.5)
Eosinophils Relative: 1 %
HCT: 29 % — ABNORMAL LOW (ref 36.0–46.0)
Hemoglobin: 9.4 g/dL — ABNORMAL LOW (ref 12.0–15.0)
Immature Granulocytes: 0 %
Lymphocytes Relative: 23 %
Lymphs Abs: 1.1 10*3/uL (ref 0.7–4.0)
MCH: 30.5 pg (ref 26.0–34.0)
MCHC: 32.4 g/dL (ref 30.0–36.0)
MCV: 94.2 fL (ref 80.0–100.0)
Monocytes Absolute: 0.6 10*3/uL (ref 0.1–1.0)
Monocytes Relative: 13 %
Neutro Abs: 3 10*3/uL (ref 1.7–7.7)
Neutrophils Relative %: 62 %
Platelets: 149 10*3/uL — ABNORMAL LOW (ref 150–400)
RBC: 3.08 MIL/uL — ABNORMAL LOW (ref 3.87–5.11)
RDW: 13.6 % (ref 11.5–15.5)
WBC: 4.9 10*3/uL (ref 4.0–10.5)
nRBC: 0 % (ref 0.0–0.2)

## 2022-07-24 LAB — POTASSIUM: Potassium: 5.2 mmol/L — ABNORMAL HIGH (ref 3.5–5.1)

## 2022-07-24 MED ORDER — ENOXAPARIN SODIUM 30 MG/0.3ML IJ SOSY
30.0000 mg | PREFILLED_SYRINGE | INTRAMUSCULAR | Status: DC
  Administered 2022-07-25: 30 mg via SUBCUTANEOUS
  Filled 2022-07-24: qty 0.3

## 2022-07-24 MED ORDER — ALBUTEROL SULFATE (2.5 MG/3ML) 0.083% IN NEBU
5.0000 mg | INHALATION_SOLUTION | Freq: Once | RESPIRATORY_TRACT | Status: AC
  Administered 2022-07-24: 5 mg via RESPIRATORY_TRACT
  Filled 2022-07-24: qty 6

## 2022-07-24 MED ORDER — SODIUM ZIRCONIUM CYCLOSILICATE 10 G PO PACK
10.0000 g | PACK | Freq: Once | ORAL | Status: AC
  Administered 2022-07-24: 10 g via ORAL
  Filled 2022-07-24: qty 1

## 2022-07-24 MED ORDER — PATIROMER SORBITEX CALCIUM 8.4 G PO PACK
8.4000 g | PACK | Freq: Every day | ORAL | Status: DC
  Administered 2022-07-25: 8.4 g via ORAL
  Filled 2022-07-24 (×5): qty 1

## 2022-07-24 MED ORDER — LACTATED RINGERS IV SOLN: INTRAVENOUS | Status: AC

## 2022-07-24 NOTE — Progress Notes (Signed)
Received patient from ED, did not get proper hand off report. RN Urban Gibson just said in secure chat that she is A/O x4, walky tacky. She arrived without PIV and she has order of IVF. Charge nurse informed.

## 2022-07-24 NOTE — Assessment & Plan Note (Addendum)
Creatinine elevated to 2.20 up from 1.7 just from last week.  She is also noted to have some borderline hypotension here.  Suspect could be due to overdiuresis on both torsemide and spironolactone. -Hold spironolactone and torsemide, Entresto for now.  Cardiology is following and appreciate their recommendation on dosage going forward. -Start continuous IV LR for the next 12 hours and follow repeat creatinine.  Monitor fluid status closely given she has systolic heart failure.

## 2022-07-24 NOTE — ED Triage Notes (Signed)
Pt arrived POV from home stating her doctor told her that her kidney function was off sand her potassium was high.

## 2022-07-24 NOTE — ED Provider Triage Note (Signed)
Emergency Medicine Provider Triage Evaluation Note  Vanessa [name] , a 71 y.o. female  was evaluated in triage.  Pt complains of abnormal lab onset today.  Was told to come in by her doctor due to elevated potassium and worsening kidney function.  Denies chest pain, shortness of breath, abdominal pain, nausea, vomiting, urinary symptoms.  Patient is not currently on dialysis.    Review of Systems  Positive: As per HPI Negative:   Physical Exam  BP 101/60 (BP Location: Right Arm)   Pulse 76   Temp 98.9 F (37.2 C) (Oral)   Resp 16   Ht 5\' 3"  (1.6 m)   Wt 85.7 kg   SpO2 97%   BMI 33.48 kg/m  Gen:   Awake, no distress  Resp:  Normal effort  MSK:   Moves extremities without difficulty  Other:    Medical Decision Making  Medically screening exam initiated at 3:15 PM.  Appropriate orders placed.  ACIRE TANG was informed that the remainder of the evaluation will be completed by another provider, this initial triage assessment does not replace that evaluation, and the importance of remaining in the ED until their evaluation is complete.  3:16 PM - Discussed with RN that patient is in need of a room immediately. RN aware and working on room placement.    Kimora Stankovic A, PA-C 07/24/22 1516

## 2022-07-24 NOTE — ED Provider Notes (Signed)
[place] [facility] EMERGENCY DEPARTMENT Provider Note   CSN: [id number] Arrival date & time: 07/24/22  1419     History  Chief Complaint  Patient presents with   Abnormal Lab    Vanessa [name] is a 71 y.o. female with a past medical history of HFrEF most recent echo 02/13/2022 showing EF of 40 to 45% and hyperthyroidism on methimazole presenting to the emergency room after being sent from her cardiologist.  Patient was sent to emergency room today, with concerns of hyperkalemia and worsening kidney function.  Patient's baseline creatinine seems to be around 1.2-1.5 and today has been elevated at 2.88.  Patient reports that she is on torsemide and spironolactone, and patient was told to come to the emergency room today because of worsening kidney function.  Patient denies any chest pain.  She denies any shortness of breath.  She denies any loss of consciousness or syncopal episodes.  Patient does report that she is supposed to stop her spironolactone and torsemide today per her cardiologist, and to increase her Veltassa.  Patient states that she is feeling fine and has no concerns at this point.   Abnormal Lab      Home Medications Prior to Admission medications   Medication Sig Start Date End Date Taking? Authorizing Provider  amoxicillin (AMOXIL) 500 MG tablet Take 4 tablets by mouth one hour prior to dental appointments 11/11/21   Larey Dresser, MD  aspirin 81 MG EC tablet Take 1 tablet (81 mg total) by mouth daily. 10/21/12   Sueanne Margarita, MD  Calcium Carbonate-Vitamin D (CALCIUM 600+D PO) Take 1 tablet by mouth daily.    [provider]  carvedilol (COREG) 12.5 MG tablet Take 1.5 tablets (18.75 mg total) by mouth 2 (two) times daily with a meal. 07/08/22   Larey Dresser, MD  empagliflozin (JARDIANCE) 10 MG TABS tablet TAKE 1 TABLET BY MOUTH ONCE DAILY BEFORE BREAKFAST 03/17/22   Larey Dresser, MD  ibandronate (BONIVA) 150 MG tablet Take 150 mg by  mouth every 30 (thirty) days. Take in the morning with a full glass of water, on an empty stomach, and do not take anything else by mouth or lie down for the next 30 min.    [provider]  patiromer (VELTASSA) 8.4 g packet Take 1 packet (8.4 g total) by mouth daily. 02/05/22   Larey Dresser, MD  sacubitril-valsartan (ENTRESTO) 97-103 MG Take 0.5 tablets by mouth 2 (two) times daily. 07/08/22   Larey Dresser, MD  spironolactone (ALDACTONE) 25 MG tablet Take 0.5 tablets (12.5 mg total) by mouth daily. 11/11/21   Larey Dresser, MD  torsemide (DEMADEX) 20 MG tablet Take 1 tablet (20 mg total) by mouth daily. 07/08/22   Larey Dresser, MD      Allergies    Patient has no known allergies.    Review of Systems   Review of Systems  Constitutional:  Negative for chills and fever.  Respiratory:  Negative for shortness of breath.   Cardiovascular:  Negative for chest pain.  Gastrointestinal:  Negative for constipation, diarrhea and vomiting.  Neurological:  Negative for syncope.    Physical Exam Updated Vital Signs BP 103/68   Pulse 63   Temp (!) 97.4 F (36.3 C)   Resp 18   Ht $R'5\' 3"'lD$  (1.6 m)   Wt 85.7 kg   SpO2 100%   BMI 33.48 kg/m  Physical Exam  General: Patient is resting comfortably in bed  in no acute distress  Eyes: Noninjected conjunctiva Head: Normocephalic, atraumatic  Neck: Supple, nontender, full range of motion Cardio: Regular rate and rhythm, no murmurs, rubs or gallops. 2+ pulses to bilateral upper and lower extremities  Pulmonary: Clear to ausculation bilaterally with no rales, rhonchi, and crackles  Abdomen: Soft, nontender with normoactive bowel sounds with no rebound or guarding  Neuro: Sensation intact to upper and lower extremities Skin: No rashes noted  MSK: 5/5 strength to upper and lower extremities.   ED Results / Procedures / Treatments   Labs (all labs ordered are listed, but only abnormal results are displayed) Labs Reviewed  BASIC  METABOLIC PANEL - Abnormal; Notable for the following components:      Result Value   Potassium 6.8 (*)    CO2 16 (*)    Glucose, Bld 111 (*)    BUN 120 (*)    Creatinine, Ser 2.62 (*)    GFR, Estimated 19 (*)    All other components within normal limits  CBC WITH DIFFERENTIAL/PLATELET - Abnormal; Notable for the following components:   RBC 3.08 (*)    Hemoglobin 9.4 (*)    HCT 29.0 (*)    Platelets 149 (*)    All other components within normal limits  BRAIN NATRIURETIC PEPTIDE  BASIC METABOLIC PANEL  BASIC METABOLIC PANEL  CBC    EKG EKG Interpretation  Date/Time:  Friday July 24 2022 16:24:04 EDT Ventricular Rate:  67 PR Interval:  163 QRS Duration: 106 QT Interval:  371 QTC Calculation: 392 R Axis:   54 Text Interpretation: Sinus rhythm Borderline T abnormalities, diffuse leads No significant change since last tracing Confirmed by Blanchie Dessert [phone]) on 07/24/2022 6:42:43 PM  Radiology MM 3D SCREEN BREAST BILATERAL  Result Date: 07/24/2022 CLINICAL DATA:  Screening. EXAM: DIGITAL SCREENING BILATERAL MAMMOGRAM WITH TOMOSYNTHESIS AND CAD TECHNIQUE: Bilateral screening digital craniocaudal and mediolateral oblique mammograms were obtained. Bilateral screening digital breast tomosynthesis was performed. The images were evaluated with computer-aided detection. COMPARISON:  Previous exam(s). ACR Breast Density Category c: The breast tissue is heterogeneously dense, which may obscure small masses. FINDINGS: In the left breast, a possible asymmetry warrants further evaluation. In the right breast, no findings suspicious for malignancy. IMPRESSION: Further evaluation is suggested for possible asymmetry in the left breast. RECOMMENDATION: Diagnostic mammogram and possibly ultrasound of the left breast. (Code:[id number]-85M) The patient will be contacted regarding the findings, and additional imaging will be scheduled. BI-RADS CATEGORY  0: Incomplete. Need additional imaging evaluation  and/or prior mammograms for comparison. Electronically Signed   By: Lovey Newcomer M.D.   On: 07/24/2022 07:25    Procedures Procedures    Medications Ordered in ED Medications  enoxaparin (LOVENOX) injection 30 mg (has no administration in time range)  lactated ringers infusion (has no administration in time range)  albuterol (PROVENTIL) (2.5 MG/3ML) 0.083% nebulizer solution 5 mg (5 mg Nebulization Given 07/24/22 1836)  sodium zirconium cyclosilicate (LOKELMA) packet 10 g (10 g Oral Given 07/24/22 1836)    ED Course/ Medical Decision Making/ A&P                           Medical Decision Making This is a 71 year old female with history of HFrEF most recent EF 40 to 45% on 02/13/2022 presenting to the emergency room with abnormal labs.  Patient's potassium has been going up.  Patient went to a cardiologist for labs today, and potassium came back 6.1.  Patient also  has been having elevated creatinine. 11 days ago, patient had elevated creatinine of 1.95 with GFR of 27.  Today, at her cardiologist office patient's creatinine was up at 2.88 with a EGFR of 17.  Here in the department, potassium up to 6.8 with creatinine of 2.62 with GFR of 19.  Patient currently has no complaints.  Vital signs are stable at this point.  Patient is maintaining her pressures.  Patient is not volume overloaded on exam.  She has no concerns.  Will obtain BMP and EKG in the department.  EKG not showing any evidence of hyperkalemia.  Will give albuterol and Lokelma in the department.  We will also obtain BNP.  Patient will be reevaluated.  Unsure at this point, whether this is prerenal, intrarenal, or postrenal kidney disease.  Will likely need work-up inpatient.  Amount and/or Complexity of Data Reviewed Labs: ordered.  Risk Prescription drug management. Decision regarding hospitalization.    1750: Consult placed for cardiology.  Awaiting consult.  1900: Cardiology consulted.  They agree with admission.  They state  that they will see the patient.  I speak with the patient about admission.  Patient is not happy about being admitted.  She states that she wants to go home and feels fine.  After long conversation, she is agreeable to stay 1 night to be evaluated for AKI and hyperkalemia.  [facility] hospitalist consulted.  Patient has received albuterol and Lokelma in the department.  Patient will be admitted.  1930: Consult to the hospitalist, patient has been admitted.  Repeat BMP pending.  BNP pending.        Final Clinical Impression(s) / ED Diagnoses Final diagnoses:  Hyperkalemia  Acute renal failure superimposed on stage 3b chronic kidney disease, unspecified acute renal failure type [facility])    Rx / DC Orders ED Discharge Orders     None         Leigh Aurora, DO 07/24/22 2144    Blanchie Dessert, MD 07/26/22 2324

## 2022-07-24 NOTE — H&P (Signed)
History and Physical    [name] QBH:[id number] DOB: 02/27/1951 DOA: 07/24/2022 DOS: the patient was seen and examined on 07/24/2022 PCP: Lennie Odor, PA  Patient coming from: Home  Chief Complaint:  Chief Complaint  Patient presents with   Abnormal Lab   HPI: Vanessa [name] is a 71 y.o. female with medical history significant of chronic systolic heart failure, nonischemic cardiomyopathy, mitral insufficiency s/p mitral clip (1/20), pulmonary hypertension, multinodular goiter s/p radioactive iodine treatment who presents following abnormal labs with worsening renal function and hyperkalemia.  Pt asymptomatic otherwise.  Reports never having issues with potassium in the past.  She was previously intolerant to spironolactone but unclear when this was resumed by cardiology.  She is on Veltassa. She is on torsemide prescribed at 20 mg and 40 mg on alternating days reports only taking 20 mg daily.  Reports good urine output and has been staying hydrated.  No nausea, vomiting or diarrhea.  In the ED, she was afebrile with borderline hypotension intermittently in the 90s to 50s on room air.  No leukocytosis.  Hemoglobin 9.4.  Potassium of 6.8 up from 6.1 at cardiology office.  Creatinine of 2.62 with a prior of 1.95 on 07/13/2022.  EKG on my review with normal sinus rhythm and no T wave changes.  She was given Lokelma and albuterol nebulizers in the ED.  Hospitalist then consulted for admission.  Review of Systems: As mentioned in the history of present illness. All other systems reviewed and are negative. Past Medical History:  Diagnosis Date   CHF (congestive heart failure) (HCC)    Dysrhythmia    PVC's   Hypertension    Moderate mitral regurgitation    moderate to severe MR with moderate pulmonary HTN   Nonischemic cardiomyopathy (HCC)    EF 30-35% by echo 2015   Pulmonary HTN ([place])    moderate with PASP 64mmHg by echo 2015   PVC's (premature ventricular  contractions)    Past Surgical History:  Procedure Laterality Date   CARDIAC CATHETERIZATION  2003   normal coronary arteries   CARDIAC CATHETERIZATION  2013  Nov   h/o nonischemic DCM EF 35-40% increase LVEDP    HYSTEROSCOPY WITH D & C  12/02/2012   Procedure: DILATATION AND CURETTAGE /HYSTEROSCOPY;  Surgeon: Maeola Sarah. Landry Mellow, MD;  Location: [facility];  Service: Gynecology;  Laterality: N/A;  cervical polypectomy   LEFT AND RIGHT HEART CATHETERIZATION WITH CORONARY ANGIOGRAM N/A 02/15/2015   Procedure: LEFT AND RIGHT HEART CATHETERIZATION WITH CORONARY ANGIOGRAM;  Surgeon: Jettie Booze, MD;  Location: [facility] CATH LAB;  Service: Cardiovascular;  Laterality: N/A;   MITRAL VALVE REPAIR N/A 12/14/2018   Procedure: MITRAL VALVE REPAIR;  Surgeon: Sherren Mocha, MD;  Location: [facility] CV LAB;  Service: Cardiovascular;  Laterality: N/A;   TEE WITHOUT CARDIOVERSION N/A 10/11/2018   Procedure: TRANSESOPHAGEAL ECHOCARDIOGRAM (TEE);  Surgeon: Larey Dresser, MD;  Location: [facility] ENDOSCOPY;  Service: Cardiovascular;  Laterality: N/A;   Social History:  reports that she has never smoked. She has never used smokeless tobacco. She reports that she does not drink alcohol and does not use drugs.  No Known Allergies  Family History  Problem Relation Age of Onset   Breast cancer Mother 15   Heart disease Father    Breast cancer Sister 78   Colon cancer Neg Hx    Esophageal cancer Neg Hx    Rectal cancer Neg Hx    Stomach cancer Neg Hx  Thyroid disease Neg Hx     Prior to Admission medications   Medication Sig Start Date End Date Taking? Authorizing Provider  amoxicillin (AMOXIL) 500 MG tablet Take 4 tablets by mouth one hour prior to dental appointments 11/11/21   Larey Dresser, MD  aspirin 81 MG EC tablet Take 1 tablet (81 mg total) by mouth daily. 10/21/12   Sueanne Margarita, MD  Calcium Carbonate-Vitamin D (CALCIUM 600+D PO) Take 1 tablet by mouth daily.    [provider]  carvedilol  (COREG) 12.5 MG tablet Take 1.5 tablets (18.75 mg total) by mouth 2 (two) times daily with a meal. 07/08/22   Larey Dresser, MD  empagliflozin (JARDIANCE) 10 MG TABS tablet TAKE 1 TABLET BY MOUTH ONCE DAILY BEFORE BREAKFAST 03/17/22   Larey Dresser, MD  ibandronate (BONIVA) 150 MG tablet Take 150 mg by mouth every 30 (thirty) days. Take in the morning with a full glass of water, on an empty stomach, and do not take anything else by mouth or lie down for the next 30 min.    [provider]  patiromer (VELTASSA) 8.4 g packet Take 1 packet (8.4 g total) by mouth daily. 02/05/22   Larey Dresser, MD  sacubitril-valsartan (ENTRESTO) 97-103 MG Take 0.5 tablets by mouth 2 (two) times daily. 07/08/22   Larey Dresser, MD  spironolactone (ALDACTONE) 25 MG tablet Take 0.5 tablets (12.5 mg total) by mouth daily. 11/11/21   Larey Dresser, MD  torsemide (DEMADEX) 20 MG tablet Take 1 tablet (20 mg total) by mouth daily. 07/08/22   Larey Dresser, MD    Physical Exam: Vitals:   07/24/22 1730 07/24/22 1745 07/24/22 1803 07/24/22 1845  BP: (!) 95/58 (!) 90/55  103/68  Pulse: 64 67  63  Resp: 15 17  18   Temp:   (!) 97.4 F (36.3 C)   TempSrc:      SpO2: 100% 100%  100%  Weight:      Height:       Constitutional: NAD, calm, comfortable, elderly female appearing younger than age sitting upright in bed Eyes:lids and conjunctivae normal ENMT: Mucous membranes are moist.  Neck: normal, supple Respiratory: clear to auscultation bilaterally, no wheezing, no crackles. Normal respiratory effort. No accessory muscle use.  Cardiovascular: Regular rate and rhythm, +3 systolic flow murmur/ rubs / gallops.  Trace bilateral lower extremity nonpitting edema. Abdomen: no tenderness, no mass palpated.  Bowel sounds positive.  Musculoskeletal: no clubbing / cyanosis. No joint deformity upper and lower extremities. Good ROM, no contractures. Normal muscle tone.  Skin: no rashes, lesions, ulcers.  Neurologic:  CN 2-12 grossly intact. Sensation intact,  Strength 5/5 in all 4.  Psychiatric: Normal judgment and insight. Alert and oriented x 3.  Initially in frustrated mood. Data Reviewed:  See HPI  Assessment and Plan: * AKI (acute kidney injury) ([place]) Creatinine elevated to 2.20 up from 1.7 just from last week.  She is also noted to have some borderline hypotension here.  Suspect could be due to overdiuresis on both torsemide and spironolactone. -Hold spironolactone and torsemide, Entresto for now.  Cardiology is following and appreciate their recommendation on dosage going forward. -Start continuous IV LR for the next 12 hours and follow repeat creatinine.  Monitor fluid status closely given she has systolic heart failure.  Hyperkalemia Suspect in the setting of worsening renal function and on spironolactone. -No EKG changes. -Received albuterol nebulizer and Lokelma in the ED.  Follow repeat potassium. -  continue home [place]   Chronic systolic CHF (congestive heart failure) ([place]) Compensated.  Holding diuretics for now due to AKI. -Holding Coreg due to borderline BP - Holding Entresto due to AKI  Essential hypertension Holding antihypertensive for now due to borderline BP  OBESITY BMI 33.      Advance Care Planning:   Code Status: Full Code Full  Consults: cardiology  Family Communication: No family at bedside   Severity of Illness: The appropriate patient status for this patient is INPATIENT. Inpatient status is judged to be reasonable and necessary in order to provide the required intensity of service to ensure the patient's safety. The patient's presenting symptoms, physical exam findings, and initial radiographic and laboratory data in the context of their chronic comorbidities is felt to place them at high risk for further clinical deterioration. Furthermore, it is not anticipated that the patient will be medically stable for discharge from the hospital within 2 midnights of  admission.   * I certify that at the point of admission it is my clinical judgment that the patient will require inpatient hospital care spanning beyond 2 midnights from the point of admission due to high intensity of service, high risk for further deterioration and high frequency of surveillance required.*  Author: Orene Desanctis, DO 07/24/2022 9:56 PM  For on call review [email or web address].

## 2022-07-24 NOTE — Assessment & Plan Note (Signed)
Holding antihypertensive for now due to borderline BP

## 2022-07-24 NOTE — Assessment & Plan Note (Signed)
Estimated body mass index is 33.58 kg/m as calculated from the following:   Height as of this encounter: 5' (1.524 m).   Weight as of this encounter: 78 kg.   -This will complicate overall prognosis 

## 2022-07-24 NOTE — Assessment & Plan Note (Addendum)
Compensated.  Holding diuretics for now due to AKI. -Holding Coreg due to borderline BP - Holding Entresto due to AKI

## 2022-07-24 NOTE — Assessment & Plan Note (Addendum)
Suspect in the setting of worsening renal function and on spironolactone. -No EKG changes. -Received albuterol nebulizer and Lokelma in the ED.  Follow repeat potassium. -continue home [employer]

## 2022-07-25 DIAGNOSIS — N179 Acute kidney failure, unspecified: Secondary | ICD-10-CM | POA: Diagnosis not present

## 2022-07-25 LAB — BASIC METABOLIC PANEL
Anion gap: 7 (ref 5–15)
BUN: 111 mg/dL — ABNORMAL HIGH (ref 8–23)
CO2: 15 mmol/L — ABNORMAL LOW (ref 22–32)
Calcium: 8.9 mg/dL (ref 8.9–10.3)
Chloride: 116 mmol/L — ABNORMAL HIGH (ref 98–111)
Creatinine, Ser: 2.21 mg/dL — ABNORMAL HIGH (ref 0.44–1.00)
GFR, Estimated: 23 mL/min — ABNORMAL LOW (ref >60)
Glucose, Bld: 120 mg/dL — ABNORMAL HIGH (ref 70–99)
Potassium: 5.2 mmol/L — ABNORMAL HIGH (ref 3.5–5.1)
Sodium: 138 mmol/L (ref 135–145)

## 2022-07-25 LAB — CBC
HCT: 28 % — ABNORMAL LOW (ref 36.0–46.0)
Hemoglobin: 9.1 g/dL — ABNORMAL LOW (ref 12.0–15.0)
MCH: 30.2 pg (ref 26.0–34.0)
MCHC: 32.5 g/dL (ref 30.0–36.0)
MCV: 93 fL (ref 80.0–100.0)
Platelets: 136 10*3/uL — ABNORMAL LOW (ref 150–400)
RBC: 3.01 MIL/uL — ABNORMAL LOW (ref 3.87–5.11)
RDW: 13.5 % (ref 11.5–15.5)
WBC: 4.8 10*3/uL (ref 4.0–10.5)
nRBC: 0 % (ref 0.0–0.2)

## 2022-07-25 MED ORDER — SODIUM CHLORIDE 0.9 % BOLUS PEDS
500.0000 mL | Freq: Once | INTRAVENOUS | Status: AC
  Administered 2022-07-25: 500 mL via INTRAVENOUS

## 2022-07-25 MED ORDER — OYSTER SHELL CALCIUM/D3 500-5 MG-MCG PO TABS
1.0000 | ORAL_TABLET | Freq: Every day | ORAL | Status: DC
  Administered 2022-07-26: 1 via ORAL
  Filled 2022-07-25: qty 1

## 2022-07-25 MED ORDER — CALCIUM CARB-CHOLECALCIFEROL 600-10 MG-MCG PO TABS: ORAL_TABLET | Freq: Every day | ORAL | Status: DC

## 2022-07-25 MED ORDER — EMPAGLIFLOZIN 10 MG PO TABS
10.0000 mg | ORAL_TABLET | Freq: Every day | ORAL | Status: DC
  Administered 2022-07-26: 10 mg via ORAL
  Filled 2022-07-25: qty 1

## 2022-07-25 NOTE — Progress Notes (Signed)
BP is on the low side, asymptomatic. IVF ongoing.

## 2022-07-25 NOTE — Progress Notes (Addendum)
PROGRESS NOTE    [name]  ATF:[id number] DOB: 01-May-1951 DOA: 07/24/2022 PCP: Lennie Odor, PA   Brief Narrative:  Vanessa [name] is a 71 y.o. female with medical history significant of chronic systolic heart failure, nonischemic cardiomyopathy, mitral insufficiency s/p mitral clip (1/20), pulmonary hypertension, multinodular goiter s/p radioactive iodine treatment, NIDDM2, who presents following abnormal labs with worsening renal function and hyperkalemia.  Assessment & Plan:   Principal Problem:   AKI (acute kidney injury) ([place]) Active Problems:   OBESITY   Essential hypertension   Chronic systolic CHF (congestive heart failure) (HCC)   Hyperkalemia   AKI (acute kidney injury) ([place]) -Prerenal azotemia versus diuretic use -Patient could be in early stages of cardiorenal syndrome given heart failure as below -Creatinine elevated to 2.8 up from 1.7 just from last week.   -Dehydrated with hypotension, likely intravascularly depleted -Continue to hold diuretics in the interim with small IV fluid challenge today   Hyperkalemia -In the setting of above while on spironolactone -Hold all diuretics, Lokelma previously administered potassium downtrending appropriately -Continue IV fluids, will resume diuretics in the next 24 to 48 hours pending volume status -No EKG changes. -continue home Valtessa    Chronic systolic CHF (congestive heart failure) (HCC) Hypotension - Holding Coreg/lasix/entresto due to borderline BP/AKI -Continue low volume boluses to maintain MAP 65   Essential hypertension Holding antihypertensive for now due to borderline BP   NIDDM2, well controlled Continue jardiance  Chronic anemia of chronic disease  Thrombocytopenia, mild -Follow repeat labs, no signs or symptoms of bleeding or bruising  Obesity BMI 33.  DVT prophylaxis: Lovenox Code Status: Full Family Communication: None present  Status is: Inpatient  Dispo: The patient is  from: Home              Anticipated d/c is to: Home              Anticipated d/c date is: 24 to 48 hours              Patient currently not medically stable for discharge  Consultants:  Cardiology consulted at intake  Procedures:  None  Antimicrobials:  None indicated  Subjective: No acute issues or events overnight denies nausea vomiting diarrhea constipation headache fevers chills chest pain shortness of breath.  Patient is somewhat anxious about holding her home medications and he she has had recent episodes of heart failure exacerbation due to hypervolemia.  Objective: Vitals:   07/24/22 1845 07/24/22 2236 07/24/22 2300 07/25/22 0436  BP: 103/68 (!) 85/56 (!) 90/50 (!) 91/48  Pulse: 63 78  72  Resp: 18 18  16   Temp:  98.7 F (37.1 C)  98.4 F (36.9 C)  TempSrc:  Oral  Oral  SpO2: 100% 99%  99%  Weight:      Height:        Intake/Output Summary (Last 24 hours) at 07/25/2022 0815 Last data filed at 07/25/2022 0300 Gross per 24 hour  Intake 280.41 ml  Output --  Net 280.41 ml   Filed Weights   07/24/22 1503  Weight: 85.7 kg    Examination:  General:  Pleasantly resting in bed, No acute distress. HEENT:  Normocephalic atraumatic.  Sclerae nonicteric, noninjected.  Extraocular movements intact bilaterally. Neck:  Without mass or deformity.  Trachea is midline. Lungs:  Clear to auscultate bilaterally without rhonchi, wheeze, or rales. Heart:  Regular rate and rhythm.  Without murmurs, rubs, or gallops. Abdomen:  Soft, nontender, nondistended.  Without guarding or rebound.  Extremities: Without cyanosis, clubbing, edema, or obvious deformity. Vascular:  Dorsalis pedis and posterior tibial pulses palpable bilaterally. Skin:  Warm and dry, no erythema, no ulcerations.   Data Reviewed: I have personally reviewed following labs and imaging studies  CBC: Recent Labs  Lab 07/24/22 1528 07/25/22 0145  WBC 4.9 4.8  NEUTROABS 3.0  --   HGB 9.4* 9.1*  HCT 29.0*  28.0*  MCV 94.2 93.0  PLT 149* 491*   Basic Metabolic Panel: Recent Labs  Lab 07/24/22 0837 07/24/22 1528 07/24/22 2307 07/25/22 0145  NA 138 138  --  138  K 6.1* 6.8* 5.2* 5.2*  CL 115* 110  --  116*  CO2 16* 16*  --  15*  GLUCOSE 111* 111*  --  120*  BUN 117* 120*  --  111*  CREATININE 2.88* 2.62*  --  2.21*  CALCIUM 8.9 9.6  --  8.9   Radiology Studies: No results found.  Scheduled Meds:  enoxaparin (LOVENOX) injection  30 mg Subcutaneous Q24H   patiromer  8.4 g Oral Daily   Continuous Infusions:  lactated ringers 75 mL/hr at 07/24/22 2315    LOS: 1 day   Time spent: 37min   Korry Dalgleish C Jaylenn Baiza, DO [facility]  If 7PM-7AM, please contact night-coverage [email or web address]  07/25/2022, 8:15 AM

## 2022-07-25 NOTE — Progress Notes (Signed)
  Transition of Care [facility]) Screening Note   Patient Details  Name: Vanessa [name] Date of Birth: [birth year]   [facility]) CM/SW Contact:    Bartholomew Crews, RN Phone Number: [phone] 07/25/2022, 11:27 AM    Transition of Care Department [facility]) has reviewed patient and no TOC needs have been identified at this time. We will continue to monitor patient advancement through interdisciplinary progression rounds. If new patient transition needs arise, please place a TOC consult.

## 2022-07-26 DIAGNOSIS — N179 Acute kidney failure, unspecified: Secondary | ICD-10-CM | POA: Diagnosis not present

## 2022-07-26 MED ORDER — SODIUM CHLORIDE 0.9 % IV BOLUS
250.0000 mL | Freq: Once | INTRAVENOUS | Status: AC
Start: 2022-07-26 — End: 2022-07-26
  Administered 2022-07-26: 250 mL via INTRAVENOUS

## 2022-07-26 MED ORDER — TORSEMIDE 20 MG PO TABS: 10.0000 mg | ORAL_TABLET | Freq: Every day | ORAL | 0 refills | Status: DC

## 2022-07-26 NOTE — Progress Notes (Addendum)
BP 84/45, MAP 58, asymptomatic. MD Hal Hope notified.

## 2022-07-26 NOTE — Plan of Care (Signed)

## 2022-07-26 NOTE — Progress Notes (Signed)
Patient given discharge instructions and stated understanding. 

## 2022-07-26 NOTE — Discharge Summary (Signed)
Physician Discharge Summary  Vanessa [name] YHC:[id number] DOB: [birth year] DOA: 07/24/2022  PCP: Lennie Odor, PA  Admit date: 07/24/2022 Discharge date: 07/26/2022  Admitted From: Home Disposition: Home  Recommendations for Outpatient Follow-up:  Follow up with PCP in 1-2 weeks Follow-up with cardiology later this week as discussed for medical management and reevaluation  Home Health: None Equipment/Devices: None  Discharge Condition: Stable CODE STATUS: Full Diet recommendation: Low-salt low-fat fluid restricted diet  Brief/Interim Summary: Vanessa [name] is a 71 y.o. female with medical history significant of chronic systolic heart failure, nonischemic cardiomyopathy, mitral insufficiency s/p mitral clip (1/20), pulmonary hypertension, multinodular goiter s/p radioactive iodine treatment, NIDDM2, who presents following abnormal labs with worsening renal function and hyperkalemia.  Patient admitted as above with AKI, hyperkalemia hypovolemia in the setting of chronic comorbid conditions as outlined above.  It appears patient's symptoms are likely in the setting of polypharmacy given her multiple antihypertensives, diuretics and is at risk for cardiorenal syndrome.  Patient's antihypertensives were discontinued at intake due to profound hypotension, spironolactone also discontinued in the setting of hyperkalemia.  Patient was treated with IV fluids gently with increased p.o. intake with marked improvement in patient's creatinine, she remains euvolemic at this time, unfortunately given her ongoing hypotension she was on otherwise able to tolerate her usual medications including Entresto carvedilol and given her borderline hyperkalemia her spironolactone was discontinued.  Given patient is now on unopposed diuretics with furosemide her Veltassa was also discontinued as her potassium will likely downtrend in the setting of furosemide.  We requested repeat labs and further evaluation from heart  failure team later this week to reevaluate patient's ability to tolerate her core measures as outlined below which are currently contraindicated in the setting of profound hypotension.  Hopefully over the next few days while off these medications patient's native/baseline blood pressure will become evident at which time heart failure team will be able to adjust her medications accordingly.  Patient otherwise feels quite well denies nausea vomiting diarrhea constipation headache fevers chills or chest pain is otherwise stable and agreeable for discharge home.  Discharge Diagnoses:  Principal Problem:   AKI (acute kidney injury) ([place]) Active Problems:   OBESITY   Essential hypertension   Chronic systolic CHF (congestive heart failure) (HCC)   Hyperkalemia    Discharge Instructions   Allergies as of 07/26/2022   No Known Allergies      Medication List     STOP taking these medications    amoxicillin 500 MG tablet Commonly known as: AMOXIL   carvedilol 12.5 MG tablet Commonly known as: COREG   Entresto 97-103 MG Generic drug: sacubitril-valsartan   spironolactone 25 MG tablet Commonly known as: ALDACTONE   Veltassa 8.4 g packet Generic drug: patiromer       TAKE these medications    aspirin EC 81 MG tablet Take 1 tablet (81 mg total) by mouth daily.   CALCIUM + VITAMIN D3 PO Take 1 tablet by mouth daily.   ibandronate 150 MG tablet Commonly known as: BONIVA Take 150 mg by mouth every 30 (thirty) days.   Jardiance 10 MG Tabs tablet Generic drug: empagliflozin TAKE 1 TABLET BY MOUTH ONCE DAILY BEFORE BREAKFAST What changed:  how much to take when to take this   torsemide 20 MG tablet Commonly known as: DEMADEX Take 0.5 tablets (10 mg total) by mouth daily. What changed: how much to take        No Known Allergies  Consultations: None   Procedures/Studies:  MM 3D SCREEN BREAST BILATERAL  Result Date: 07/24/2022 CLINICAL DATA:  Screening. EXAM:  DIGITAL SCREENING BILATERAL MAMMOGRAM WITH TOMOSYNTHESIS AND CAD TECHNIQUE: Bilateral screening digital craniocaudal and mediolateral oblique mammograms were obtained. Bilateral screening digital breast tomosynthesis was performed. The images were evaluated with computer-aided detection. COMPARISON:  Previous exam(s). ACR Breast Density Category c: The breast tissue is heterogeneously dense, which may obscure small masses. FINDINGS: In the left breast, a possible asymmetry warrants further evaluation. In the right breast, no findings suspicious for malignancy. IMPRESSION: Further evaluation is suggested for possible asymmetry in the left breast. RECOMMENDATION: Diagnostic mammogram and possibly ultrasound of the left breast. (Code:[id number]) The patient will be contacted regarding the findings, and additional imaging will be scheduled. BI-RADS CATEGORY  0: Incomplete. Need additional imaging evaluation and/or prior mammograms for comparison. Electronically Signed   By: Lovey Newcomer M.D.   On: 07/24/2022 07:25     Subjective: No acute issues or events overnight   Discharge Exam: Vitals:   07/26/22 0625 07/26/22 0820  BP: (!) 91/56 100/63  Pulse:  72  Resp:  18  Temp:  (!) 97.5 F (36.4 C)  SpO2:  100%   Vitals:   07/25/22 2031 07/26/22 0538 07/26/22 0625 07/26/22 0820  BP: (!) 90/53 (!) 84/45 (!) 91/56 100/63  Pulse: 71 65  72  Resp: 17 16  18   Temp: 98.9 F (37.2 C) 98.3 F (36.8 C)  (!) 97.5 F (36.4 C)  TempSrc: Oral Oral  Oral  SpO2: 100% 97%  100%  Weight:      Height:        General: Pt is alert, awake, not in acute distress Cardiovascular: RRR, S1/S2 +, no rubs, no gallops Respiratory: CTA bilaterally, no wheezing, no rhonchi Abdominal: Soft, NT, ND, bowel sounds + Extremities: no edema, no cyanosis  The results of significant diagnostics from this hospitalization (including imaging, microbiology, ancillary and laboratory) are listed below for reference.      Microbiology: No results found for this or any previous visit (from the past 240 hour(s)).   Labs: BNP (last 3 results) Recent Labs    02/13/22 1139 07/08/22 1115 07/24/22 2307  BNP 1,276.8* 975.5* 161.0*   Basic Metabolic Panel: Recent Labs  Lab 07/24/22 0837 07/24/22 1528 07/24/22 2307 07/25/22 0145  NA 138 138  --  138  K 6.1* 6.8* 5.2* 5.2*  CL 115* 110  --  116*  CO2 16* 16*  --  15*  GLUCOSE 111* 111*  --  120*  BUN 117* 120*  --  111*  CREATININE 2.88* 2.62*  --  2.21*  CALCIUM 8.9 9.6  --  8.9   Liver Function Tests: No results for input(s): "AST", "ALT", "ALKPHOS", "BILITOT", "PROT", "ALBUMIN" in the last 168 hours. No results for input(s): "LIPASE", "AMYLASE" in the last 168 hours. No results for input(s): "AMMONIA" in the last 168 hours. CBC: Recent Labs  Lab 07/24/22 1528 07/25/22 0145  WBC 4.9 4.8  NEUTROABS 3.0  --   HGB 9.4* 9.1*  HCT 29.0* 28.0*  MCV 94.2 93.0  PLT 149* 136*   Cardiac Enzymes: No results for input(s): "CKTOTAL", "CKMB", "CKMBINDEX", "TROPONINI" in the last 168 hours. BNP: Invalid input(s): "POCBNP" CBG: No results for input(s): "GLUCAP" in the last 168 hours. D-Dimer No results for input(s): "DDIMER" in the last 72 hours. Hgb A1c No results for input(s): "HGBA1C" in the last 72 hours. Lipid Profile No results for input(s): "CHOL", "HDL", "LDLCALC", "TRIG", "CHOLHDL", "LDLDIRECT" in the  last 72 hours. Thyroid function studies No results for input(s): "TSH", "T4TOTAL", "T3FREE", "THYROIDAB" in the last 72 hours.  Invalid input(s): "FREET3" Anemia work up No results for input(s): "VITAMINB12", "FOLATE", "FERRITIN", "TIBC", "IRON", "RETICCTPCT" in the last 72 hours. Urinalysis    Component Value Date/Time   COLORURINE YELLOW 12/13/2018 0844   APPEARANCEUR HAZY (A) 12/13/2018 0844   LABSPEC 1.011 12/13/2018 0844   PHURINE 5.0 12/13/2018 [place] 12/13/2018 0844   GLUCOSEU NEGATIVE 01/18/2007 0927    HGBUR NEGATIVE 12/13/2018 0844   BILIRUBINUR NEGATIVE 12/13/2018 0844   KETONESUR NEGATIVE 12/13/2018 0844   PROTEINUR NEGATIVE 12/13/2018 0844   UROBILINOGEN 0.2 mg/dL 01/18/2007 0927   NITRITE NEGATIVE 12/13/2018 0844   LEUKOCYTESUR NEGATIVE 12/13/2018 0844   Sepsis Labs Recent Labs  Lab 07/24/22 1528 07/25/22 0145  WBC 4.9 4.8   Microbiology No results found for this or any previous visit (from the past 240 hour(s)).   Time coordinating discharge: Over 30 minutes  SIGNED:   Little Ishikawa, DO [facility] 07/26/2022, 3:11 PM Pager   If 7PM-7AM, please contact night-coverage [email or web address]

## 2022-07-27 ENCOUNTER — Other Ambulatory Visit: Payer: PPO

## 2022-07-27 ENCOUNTER — Other Ambulatory Visit: Payer: Self-pay | Admitting: Family Medicine

## 2022-07-27 ENCOUNTER — Ambulatory Visit (HOSPITAL_COMMUNITY)
Admission: RE | Admit: 2022-07-27 | Discharge: 2022-07-27 | Disposition: A | Discharge status: Home / Self Care | Payer: PPO | Source: Ambulatory Visit | Attending: Cardiology | Admitting: Cardiology

## 2022-07-27 ENCOUNTER — Encounter (HOSPITAL_COMMUNITY): Payer: Self-pay | Admitting: Cardiology

## 2022-07-27 VITALS — BP 90/50 | HR 74 | Wt 182.8 lb

## 2022-07-27 DIAGNOSIS — N183 Chronic kidney disease, stage 3 unspecified: Secondary | ICD-10-CM | POA: Diagnosis not present

## 2022-07-27 DIAGNOSIS — I493 Ventricular premature depolarization: Secondary | ICD-10-CM | POA: Diagnosis not present

## 2022-07-27 DIAGNOSIS — Z7984 Long term (current) use of oral hypoglycemic drugs: Secondary | ICD-10-CM | POA: Insufficient documentation

## 2022-07-27 DIAGNOSIS — E0520 Thyrotoxicosis with toxic multinodular goiter without thyrotoxic crisis or storm: Secondary | ICD-10-CM | POA: Insufficient documentation

## 2022-07-27 DIAGNOSIS — I428 Other cardiomyopathies: Secondary | ICD-10-CM | POA: Diagnosis not present

## 2022-07-27 DIAGNOSIS — I052 Rheumatic mitral stenosis with insufficiency: Secondary | ICD-10-CM | POA: Diagnosis not present

## 2022-07-27 DIAGNOSIS — R0609 Other forms of dyspnea: Secondary | ICD-10-CM | POA: Diagnosis not present

## 2022-07-27 DIAGNOSIS — I272 Pulmonary hypertension, unspecified: Secondary | ICD-10-CM | POA: Insufficient documentation

## 2022-07-27 DIAGNOSIS — R928 Other abnormal and inconclusive findings on diagnostic imaging of breast: Secondary | ICD-10-CM

## 2022-07-27 DIAGNOSIS — R002 Palpitations: Secondary | ICD-10-CM | POA: Diagnosis not present

## 2022-07-27 DIAGNOSIS — I5022 Chronic systolic (congestive) heart failure: Secondary | ICD-10-CM

## 2022-07-27 DIAGNOSIS — Z79899 Other long term (current) drug therapy: Secondary | ICD-10-CM | POA: Insufficient documentation

## 2022-07-27 DIAGNOSIS — X58XXXA Exposure to other specified factors, initial encounter: Secondary | ICD-10-CM | POA: Diagnosis not present

## 2022-07-27 DIAGNOSIS — I130 Hypertensive heart and chronic kidney disease with heart failure and stage 1 through stage 4 chronic kidney disease, or unspecified chronic kidney disease: Secondary | ICD-10-CM | POA: Insufficient documentation

## 2022-07-27 DIAGNOSIS — W19XXXA Unspecified fall, initial encounter: Secondary | ICD-10-CM | POA: Insufficient documentation

## 2022-07-27 LAB — BASIC METABOLIC PANEL
Anion gap: 4 — ABNORMAL LOW (ref 5–15)
BUN: 59 mg/dL — ABNORMAL HIGH (ref 8–23)
CO2: 20 mmol/L — ABNORMAL LOW (ref 22–32)
Calcium: 8.7 mg/dL — ABNORMAL LOW (ref 8.9–10.3)
Chloride: 116 mmol/L — ABNORMAL HIGH (ref 98–111)
Creatinine, Ser: 1.63 mg/dL — ABNORMAL HIGH (ref 0.44–1.00)
GFR, Estimated: 34 mL/min — ABNORMAL LOW (ref >60)
Glucose, Bld: 109 mg/dL — ABNORMAL HIGH (ref 70–99)
Potassium: 5 mmol/L (ref 3.5–5.1)
Sodium: 140 mmol/L (ref 135–145)

## 2022-07-27 LAB — BRAIN NATRIURETIC PEPTIDE: B Natriuretic Peptide: 993.5 pg/mL — ABNORMAL HIGH (ref 0.0–100.0)

## 2022-07-27 MED ORDER — CARVEDILOL 3.125 MG PO TABS: 3.1250 mg | ORAL_TABLET | Freq: Two times a day (BID) | ORAL | 3 refills | Status: DC

## 2022-07-27 NOTE — Patient Instructions (Addendum)
Medication Changes:  START Carvedilol 3.125 mg Twice daily   Lab Work:  Labs done today, we will call you for abnormal results  Testing/Procedures:  Your physician has requested that you have an echocardiogram. Echocardiography is a painless test that uses sound waves to create images of your heart. It provides your doctor with information about the size and shape of your heart and how well your heart's chambers and valves are working. This procedure takes approximately one hour. There are no restrictions for this procedure.  Referrals:  none  Special Instructions // Education:  Do the following things EVERYDAY: Weigh yourself in the morning before breakfast. Write it down and keep it in a log. Take your medicines as prescribed Eat low salt foods--Limit salt (sodium) to 2000 mg per day.  Stay as active as you can everyday Limit all fluids for the day to less than 2 liters   Follow-Up in: 3 weeks  At the [facility], you and your health needs are our priority. We have a designated team specialized in the treatment of Heart Failure. This Care Team includes your primary Heart Failure Specialized Cardiologist (physician), Advanced Practice Providers (APPs- Physician Assistants and Nurse Practitioners), and Pharmacist who all work together to provide you with the care you need, when you need it.   You may see any of the following providers on your designated Care Team at your next follow up:  Dr Glori Bickers Dr Haynes Kerns, NP Lyda Jester, [place] [facility] [place], [place] Audry Riles, PharmD   Please be sure to bring in all your medications bottles to every appointment.   Need to Contact us:  If you have any questions or concerns before your next appointment please send [place] a message through [place] or call our office at [phone].    TO LEAVE A MESSAGE FOR THE NURSE SELECT OPTION 2, PLEASE LEAVE A MESSAGE INCLUDING: YOUR  NAME DATE OF BIRTH CALL BACK NUMBER REASON FOR CALL**this is important as we prioritize the call backs  YOU WILL RECEIVE A CALL BACK THE SAME DAY AS LONG AS YOU CALL BEFORE 4:00 PM

## 2022-07-27 NOTE — Progress Notes (Signed)
ID:  Vanessa [name], DOB 1951-01-07, MRN [id number]   Provider location:  Advanced Heart Failure Type of Visit: Established patient   PCP:  Lennie Odor, PA  Cardiologist:   Dr. Aundra Dubin   History of Present Illness: Vanessa [name] is a 71 y.o. with history of nonischemic cardiomyopathy and presents for followup of CHF. Patient has had a low EF recognized since at least 9/14, when echo showed EF 40-45%.  In 2023/07/06, her child died, which led to significant stress.  She developed worsening exertional dyspnea and subsequent echo showed a fall in EF to 25-30%.  She had left and right heart catheterization in 3/16 with no significant coronary disease detected and moderately elevated right and left heart filling pressures along with pulmonary venous hypertension.  Echo was done 12/16: EF 45%, diffuse hypokinesis. She had a repeat echo 11/17 => EF 45% with moderately dilated LV, moderate MR.  Echo 11/18 showed EF stable at 45% with mild LV dilation and mildly decreased RV systolic function, moderate MR.   Spironolactone was stopped due to elevated K. She apparently did not tolerate Veltassa well.   Echo was done in 11/19.  EF 30-35% with mild LV hypertrophy, mild-moderately decreased RV systolic function, possible severe MR with restricted posterior leaflet, PASP 50 mmHg, severe LAE.  TEE was done in 11/19 to assess mitral regurgitation.  This showed severe MR with restricted posterior leaflet.   In 1/20, she had Mitraclip with reduction in MR to 1+.    She had echo in 9/20 with EF 30-35%, severe LAE, normal RV, s/p Mitraclip with mild-moderate MR and mild MS.  The IVC was dilated, suggesting volume overload.   She was unable to tolerate Bidil due to lightheadedness.   Echo in 1/21 showed EF 40-45%, RV normal, biatrial dilation, mild mitral stenosis mean 5 mmHg, mild-moderate MR (s/p Mitraclip).  Echo in 1/22 showed EF 40-45% with moderate LV dilation, normal RV size and systolic  function, severe LAE, s/p Mitraclip with probably mild mitral stenosis (mean gradient 7, MVA 2.2 cm^2 by PHT), moderate MR.   Echo in 3/23 showed EF 40-45%, moderate LV dilation, normal RV, normal IVC, s/p MV repair with Mitraclip with mild mitral stenosis mean gradient 7 mmHg, moderate MR.   Patient had labs done in 8/23 showing K up to 6.1 with AKI/creatinine 2.88.  She was admitted and K was treated.  Entresto, spironolactone, Coreg, and Veltassa were stopped.  Torsemide was decreased to 10 mg daily.   She returns for followup of CHF and recent hyperkalemia/AKI.  Weight down 1 lb. She reports stable symptoms.  She is not short of breath walking on flat ground.  No orthopnea/PND.  No chest pain.  No lightheadedness. Prior to the recent admission, she denies diarrhea, nausea/vomiting, or lightheadedness.  She was not taking Ibuprofen or other nephrotoxin. No obvious explanation for event. BP today is 90/50.   Labs (7/16): K 4.5, creatinine 1.12, BNP 596 Labs (8/16): K 4 => 5.1, creatinine 1.29 => 1.5, BNP 388 => 426 Labs (9/16): K 4, creatinine 1.08, BNP 502 Labs (12/16): K 4.9, creatinine 1.57 Labs (5/17): K 5.2, creatinine 1.3 Labs (8/17): BNP 152 Labs (11/17): K 4.9, creatinine 1.32, BNP 236 Labs (8/18): K 4.6, creatinine 1.57 Labs (11/18): K 3.8, creatinine 1.29 Labs (2/19): K 5.1, creatinine 1.44 Labs (9/19): K 3.8, creatinine 1.08 Labs (11/19): K 4.3, creatinine 1.3 Labs (1/20): K 4.8, creatinine 1.68 Labs (3/20): K 4.4, creatinine 1.5  Labs (9/20): K 4.4, creatinine 1.77 => 1.48, LDL 109 Labs (10/20): K 4.6, creatinine 1.66 Labs (12/20): K 4.7, creatinine 1.59 Labs (3/21): K 4.5, creatinine 1.73 Labs (6/21): K 4.6, creatinine 1.7 Labs (10/21): TSH normal, K 4.2, creatinine 1.37 Labs (3/22): K 4.7, creatinine 1.44 Labs (5/22): K 4.2, creatinine 1.75 Labs (9/22): K 4.5, creatinine 2.12 => 1.81 Labs (12/22): K 4, creatinine 1.31 Labs (3/23): K 4.2, creatinine 1.75, BNP 1276 Labs  (6/23): TSH normal Labs (8/23): K 6.1 => 5.2, creatinine 1.95 => 2.88 => 2.21, BUN 120 => 111  PMH: 1. Cardiomyopathy: Nonischemic.  Echo (9/14) with EF 40-45%.  Echo (2/16) with EF 25-30%.  Echo (4/16) with EF 30-35%, moderate MR.  Echo (6/16) with EF 40%, moderate LV dilation, moderate diastolic dysfunction, moderate MR, moderate TR, PA systolic pressure 52 mmHg. RHC/LHC (3/16) with no significant CAD, EF 20-25%, 3+ MR; mean RA 13, PA 70/30 mean 45, mean PCWP 31 mmHg, CI 2.2, PVR 3.1 WU.  Echo (12/16) with EF 45%, diffuse hypokinesis, moderate MR, normal RV size and systolic function.  - Echo (11/17): EF 45%, moderately dilated LV, moderate eccentric posterior MR, PASP 42 mmHg, normal RV size and systolic function.  - Echo (11/18): EF 45%, mild LV dilation, moderate diastolic dysfunction, normal RV size with mildly decreased systolic function, PASP 50 mmHg, moderate MR.  - Echo (11/19): EF 30-35%, mild LV dilation, severe LAE, mild-moderately decreased RV systolic function, possible severe MR with restricted posterior leaflet, PASP 50 mmHg.  - Echo (1/20): EF 30-35%, severe LV dilation, s/p Mitraclip with mild MR and mild MS, PASP 48 mmHg.  - RHC (1/20, with Mitraclip): mean RA 10, mean LA 18 - Echo (2/20): EF 30-35%, s/p mitraclip with mild mitral stenosis, mean MV gradient 5 mmHg, moderate MR, PASP 56 mmHg.  - Echo (9/20): EF 30-35%, severe LAE, normal RV, s/p Mitraclip with mild-moderate MR and mild MS.  The IVC was dilated, suggesting volume overload.  - Echo (1/21): EF 40-45%, RV normal, biatrial dilation, mild mitral stenosis mean 5 mmHg, mild-moderate MR (s/p Mitraclip).  - Echo (1/22): EF 40-45% with moderate LV dilation, normal RV size and systolic function, severe LAE, s/p Mitraclip with probably mild mitral stenosis (mean gradient 7, MVA 2.2 cm^2 by PHT), moderate MR. - Echo (3/23): EF 40-45%, moderate LV dilation, normal RV, normal IVC, s/p MV repair with Mitraclip with mild mitral  stenosis mean gradient 7 mmHg, moderate MR.  2. PVCs: Holter (10/15) with 12% PVCs. 3. Pulmonary venous hypertension: CTA chest (7/16) negative for PE. PFTs (7/16) with minimal obstruction, mild restriction, moderately decreased DLCO.  4. Thyroid nodules: Biopsy benign.  5. CKD stage 3: Episodes of AKI/hyperkalemia in 8/23.  6. Mitral regurgitation: TEE (11/19) with EF 30-35%, moderate LV dilation, severe MR with posterior leaflet restriction and ERO 0.41 cm^2, no mitral stenosis, mild to moderately decreased RV systolic function, peak RV-RA gradient 50 mmHg.  - Mitraclip placement x 2 in 1/20 with 1+ residual MR.  - Echo (2/20) with moderate residual MR, mild MS with mean gradient 5 mmHg.  - Echo (9/20) with mild-moderate MR, mild MS.  - Echo (1/21) with mild-moderate MR, mild MS s/p Mitraclip - Echo (1/22) with moderate residual MR, mild-moderate mitral stenosis.  - Echo (3/23) with moderate MR, mild MS s/p Mitraclip.  7. Hyperthyroism: Toxic multinodular goiter.   SH: Lives alone, only child passed away in 07-08-23, nonsmoker, no ETOH or drugs.   FH: Father with MIs  ROS: All  systems reviewed and negative except as per HPI.   Current Outpatient Medications  Medication Sig Dispense Refill   aspirin 81 MG EC tablet Take 1 tablet (81 mg total) by mouth daily.     Calcium Carb-Cholecalciferol (CALCIUM + VITAMIN D3 PO) Take 1 tablet by mouth daily.     carvedilol (COREG) 3.125 MG tablet Take 1 tablet (3.125 mg total) by mouth 2 (two) times daily. 60 tablet 3   empagliflozin (JARDIANCE) 10 MG TABS tablet TAKE 1 TABLET BY MOUTH ONCE DAILY BEFORE BREAKFAST 90 tablet 3   ibandronate (BONIVA) 150 MG tablet Take 150 mg by mouth every 30 (thirty) days.     torsemide (DEMADEX) 20 MG tablet Take 0.5 tablets (10 mg total) by mouth daily. 45 tablet 0   No current facility-administered medications for this encounter.   BP (!) 90/50   Pulse 74   Wt 82.9 kg (182 lb 12.8 oz)   SpO2 99%   BMI 32.38  kg/m    Wt Readings from Last 3 Encounters:  07/27/22 82.9 kg (182 lb 12.8 oz)  07/24/22 85.7 kg (189 lb)  07/08/22 83.3 kg (183 lb 9.6 oz)   Exam:   General: NAD Neck: JVP 7-8 cm, no thyromegaly or thyroid nodule.  Lungs: Clear to auscultation bilaterally with normal respiratory effort. CV: Nondisplaced PMI.  Heart regular S1/S2, no S3/S4, 2/6 HSM apex.  No peripheral edema.  No carotid bruit.  Normal pedal pulses.  Abdomen: Soft, nontender, no hepatosplenomegaly, no distention.  Skin: Intact without lesions or rashes.  Neurologic: Alert and oriented x 3.  Psych: Normal affect. Extremities: No clubbing or cyanosis.  HEENT: Normal.   Assessment/Plan: 1. Chronic systolic CHF: Nonischemic cardiomyopathy.  EF up to 45% on 12/16, repeat echo 11/18 shows that EF remains about 45%.  Etiology of NICM still unclear. Had frequent PVCs by past holter (12%) but doesn't explain her cardiomyopathy. Had improvement of palpitations on Coreg. Cannot rule out myocarditis. May also have component of Takotsubo with passing of child but EF has not improved as completely as would be expected with Takotsubo. Have previously discussed cardiac MRI to look for infiltrative disease but she says that she would be too claustrophobic.  Echo in 11/19 showed EF down to 30-35% and mild to moderate RV systolic function.  There also appeared to be severe mitral regurgitation, which seemed progressive from the past. TEE in 11/19 confirmed severe MR with restricted posterior leaflet => Mitraclip 1/20 with decrease in MR to 1+.  Echo in 1/20 showed EF 30-35% with mild MR s/p Mitraclip. Echo in 9/20 with stable EF 30-35% but dilated IVC suggestive of volume overload.  Echo in 1/22 with EF 40-45%, normal RV.  Echo in 3/23 showed  EF 40-45%, moderate LV dilation, normal RV, normal IVC, s/p MV repair with Mitraclip with mild mitral stenosis mean gradient 7 mmHg, moderate MR. NYHA class II, not volume overloaded on exam.  Not  orthostatic after multiple meds stopped with recent AKI.  - Continue torsemide 10 mg daily.  BMET today.  - She will stay off spironolactone and Entresto for now.  Of note, she did not tolerate Bidil in the past.  - Restart Coreg at 3.125 mg bid.   - Continue empagliflozin 10 mg daily.      - EF now above ICD range.   2. PVCs: Frequent (12%) on 10/15 holter.   Pt refused repeat holter monitor for further assessment. PVC count was probably not high enough to cause  cardiomyopathy, unlikely primary etiology. - Improved palpitations on Coreg => restarting low dose today.   3. Pulmonary hypertension: Primarily pulmonary venous HTN (PVR 3.1) from elevated left atrial pressure (previous RHC above).   - Best treatment remains adequate diuresis. 4. Mitral regurgitation:  TEE in 11/19 with severe MR, restricted posterior leaflet.  She had Mitraclip in 1/20. Post-op echo showed mild MR and mild mitral stenosis.  Echo in 1/22 showed moderate MR, mild-moderate MS. Echo in 3/23 showed moderate MR, mild-moderate MS.  - Will need antibiotics for dental prophylaxis.  5. 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  6. 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  She is now off Entresto and spironolactone. Starting back lower dose of Coreg.  - BMET today.  - Nephrology referral.   Recommended follow-up:  3 wks with APP.   Signed, [name], MD  07/27/2022  [place] [address] [facility] [place] [address] [phone] (office) [phone] (fax)

## 2022-07-30 ENCOUNTER — Telehealth (HOSPITAL_COMMUNITY): Payer: Self-pay

## 2022-07-30 ENCOUNTER — Ambulatory Visit: Payer: PPO | Admitting: Endocrinology

## 2022-07-30 NOTE — Telephone Encounter (Signed)
Referral faxed to [facility] on 07/30/2022 at 10.30am

## 2022-08-05 ENCOUNTER — Ambulatory Visit (HOSPITAL_COMMUNITY)
Admission: RE | Admit: 2022-08-05 | Discharge: 2022-08-05 | Disposition: A | Discharge status: Home / Self Care | Payer: PPO | Source: Ambulatory Visit | Attending: Physician Assistant | Admitting: Physician Assistant

## 2022-08-05 DIAGNOSIS — I5022 Chronic systolic (congestive) heart failure: Secondary | ICD-10-CM

## 2022-08-05 DIAGNOSIS — I272 Pulmonary hypertension, unspecified: Secondary | ICD-10-CM | POA: Insufficient documentation

## 2022-08-05 DIAGNOSIS — I340 Nonrheumatic mitral (valve) insufficiency: Secondary | ICD-10-CM | POA: Diagnosis not present

## 2022-08-05 DIAGNOSIS — I428 Other cardiomyopathies: Secondary | ICD-10-CM | POA: Diagnosis not present

## 2022-08-05 DIAGNOSIS — Z8249 Family history of ischemic heart disease and other diseases of the circulatory system: Secondary | ICD-10-CM | POA: Insufficient documentation

## 2022-08-05 DIAGNOSIS — I110 Hypertensive heart disease with heart failure: Secondary | ICD-10-CM | POA: Insufficient documentation

## 2022-08-05 DIAGNOSIS — I493 Ventricular premature depolarization: Secondary | ICD-10-CM | POA: Diagnosis not present

## 2022-08-05 LAB — ECHOCARDIOGRAM COMPLETE
Area-P 1/2: 2.57 cm2
MV M vel: 5.84 m/s
MV Peak grad: 136.4 mmHg
MV VTI: 0.93 cm2
S' Lateral: 4.5 cm

## 2022-08-07 ENCOUNTER — Ambulatory Visit
Admission: RE | Admit: 2022-08-07 | Discharge: 2022-08-07 | Disposition: A | Discharge status: Home / Self Care | Payer: PPO | Source: Ambulatory Visit | Attending: Family Medicine | Admitting: Family Medicine

## 2022-08-07 ENCOUNTER — Other Ambulatory Visit: Payer: Self-pay | Admitting: Family Medicine

## 2022-08-07 DIAGNOSIS — N6012 Diffuse cystic mastopathy of left breast: Secondary | ICD-10-CM | POA: Diagnosis not present

## 2022-08-07 DIAGNOSIS — N6320 Unspecified lump in the left breast, unspecified quadrant: Secondary | ICD-10-CM

## 2022-08-07 DIAGNOSIS — R928 Other abnormal and inconclusive findings on diagnostic imaging of breast: Secondary | ICD-10-CM | POA: Diagnosis not present

## 2022-08-12 ENCOUNTER — Ambulatory Visit (HOSPITAL_COMMUNITY)
Admission: RE | Admit: 2022-08-12 | Discharge: 2022-08-12 | Disposition: A | Discharge status: Home / Self Care | Payer: PPO | Source: Ambulatory Visit | Attending: Family Medicine | Admitting: Family Medicine

## 2022-08-12 ENCOUNTER — Encounter (HOSPITAL_COMMUNITY): Payer: Self-pay

## 2022-08-12 VITALS — BP 132/76 | HR 86 | Ht 63.0 in | Wt 182.8 lb

## 2022-08-12 DIAGNOSIS — Z7984 Long term (current) use of oral hypoglycemic drugs: Secondary | ICD-10-CM | POA: Insufficient documentation

## 2022-08-12 DIAGNOSIS — I428 Other cardiomyopathies: Secondary | ICD-10-CM | POA: Insufficient documentation

## 2022-08-12 DIAGNOSIS — Z79899 Other long term (current) drug therapy: Secondary | ICD-10-CM | POA: Diagnosis not present

## 2022-08-12 DIAGNOSIS — N183 Chronic kidney disease, stage 3 unspecified: Secondary | ICD-10-CM | POA: Diagnosis not present

## 2022-08-12 DIAGNOSIS — Z9889 Other specified postprocedural states: Secondary | ICD-10-CM | POA: Diagnosis not present

## 2022-08-12 DIAGNOSIS — R002 Palpitations: Secondary | ICD-10-CM | POA: Insufficient documentation

## 2022-08-12 DIAGNOSIS — E041 Nontoxic single thyroid nodule: Secondary | ICD-10-CM | POA: Diagnosis not present

## 2022-08-12 DIAGNOSIS — I130 Hypertensive heart and chronic kidney disease with heart failure and stage 1 through stage 4 chronic kidney disease, or unspecified chronic kidney disease: Secondary | ICD-10-CM | POA: Insufficient documentation

## 2022-08-12 DIAGNOSIS — I493 Ventricular premature depolarization: Secondary | ICD-10-CM | POA: Diagnosis not present

## 2022-08-12 DIAGNOSIS — I5022 Chronic systolic (congestive) heart failure: Secondary | ICD-10-CM | POA: Diagnosis not present

## 2022-08-12 DIAGNOSIS — I272 Pulmonary hypertension, unspecified: Secondary | ICD-10-CM

## 2022-08-12 DIAGNOSIS — I052 Rheumatic mitral stenosis with insufficiency: Secondary | ICD-10-CM | POA: Insufficient documentation

## 2022-08-12 DIAGNOSIS — E0520 Thyrotoxicosis with toxic multinodular goiter without thyrotoxic crisis or storm: Secondary | ICD-10-CM | POA: Insufficient documentation

## 2022-08-12 DIAGNOSIS — N184 Chronic kidney disease, stage 4 (severe): Secondary | ICD-10-CM | POA: Diagnosis not present

## 2022-08-12 LAB — BASIC METABOLIC PANEL
Anion gap: 8 (ref 5–15)
BUN: 40 mg/dL — ABNORMAL HIGH (ref 8–23)
CO2: 22 mmol/L (ref 22–32)
Calcium: 9.1 mg/dL (ref 8.9–10.3)
Chloride: 109 mmol/L (ref 98–111)
Creatinine, Ser: 1.65 mg/dL — ABNORMAL HIGH (ref 0.44–1.00)
GFR, Estimated: 33 mL/min — ABNORMAL LOW (ref >60)
Glucose, Bld: 96 mg/dL (ref 70–99)
Potassium: 4.6 mmol/L (ref 3.5–5.1)
Sodium: 139 mmol/L (ref 135–145)

## 2022-08-12 LAB — BRAIN NATRIURETIC PEPTIDE: B Natriuretic Peptide: 972.8 pg/mL — ABNORMAL HIGH (ref 0.0–100.0)

## 2022-08-12 MED ORDER — CARVEDILOL 6.25 MG PO TABS: 6.2500 mg | ORAL_TABLET | Freq: Two times a day (BID) | ORAL | 3 refills | Status: DC

## 2022-08-12 NOTE — Progress Notes (Signed)
ID:  Vanessa [name], DOB 06-Sep-1951, MRN [id number]   Provider location:  Advanced Heart Failure Type of Visit: Established patient   PCP:  Lennie Odor, PA  HF Cardiologist: Dr. Aundra Dubin   History of Present Illness: Vanessa [name] is a 71 y.o. with history of nonischemic cardiomyopathy and presents for followup of CHF. Patient has had a low EF recognized since at least 9/14, when echo showed EF 40-45%.  In 07-09-23, her child died, which led to significant stress.  She developed worsening exertional dyspnea and subsequent echo showed a fall in EF to 25-30%.  She had left and right heart catheterization in 3/16 with no significant coronary disease detected and moderately elevated right and left heart filling pressures along with pulmonary venous hypertension.  Echo was done 12/16: EF 45%, diffuse hypokinesis. She had a repeat echo 11/17 => EF 45% with moderately dilated LV, moderate MR.  Echo 11/18 showed EF stable at 45% with mild LV dilation and mildly decreased RV systolic function, moderate MR.   Spironolactone was stopped due to elevated K. She apparently did not tolerate Veltassa well.   Echo was done in 11/19.  EF 30-35% with mild LV hypertrophy, mild-moderately decreased RV systolic function, possible severe MR with restricted posterior leaflet, PASP 50 mmHg, severe LAE.  TEE was done in 11/19 to assess mitral regurgitation.  This showed severe MR with restricted posterior leaflet.   In 1/20, she had Mitraclip with reduction in MR to 1+.    She had echo in 9/20 with EF 30-35%, severe LAE, normal RV, s/p Mitraclip with mild-moderate MR and mild MS.  The IVC was dilated, suggesting volume overload.   She was unable to tolerate Bidil due to lightheadedness.   Echo in 1/21 showed EF 40-45%, RV normal, biatrial dilation, mild mitral stenosis mean 5 mmHg, mild-moderate MR (s/p Mitraclip).  Echo in 1/22 showed EF 40-45% with moderate LV dilation, normal RV size and systolic  function, severe LAE, s/p Mitraclip with probably mild mitral stenosis (mean gradient 7, MVA 2.2 cm^2 by PHT), moderate MR.   Echo in 3/23 showed EF 40-45%, moderate LV dilation, normal RV, normal IVC, s/p MV repair with Mitraclip with mild mitral stenosis mean gradient 7 mmHg, moderate MR.   Patient had labs done in 8/23 showing K up to 6.1 with AKI/creatinine 2.88.  She was admitted and K was treated.  Entresto, spironolactone, Coreg, and Veltassa were stopped.  Torsemide was decreased to 10 mg daily.   Echo (9/23) showed EF stable 40-45%, mild MS (mean gradient 7 mmHg) and mild to moderate MR s/p Mitraclip.   Today she returns for HF follow up. Overall feeling fine. She is not SOB walking up steps, but will get fatigued. Denies palpitations, abnormal bleeding, CP, dizziness, edema, or PND/Orthopnea. Appetite ok. No fever or chills. Weight at home 181 pounds. Taking all medications.   Labs (7/16): K 4.5, creatinine 1.12, BNP 596 Labs (8/16): K 4 => 5.1, creatinine 1.29 => 1.5, BNP 388 => 426 Labs (9/16): K 4, creatinine 1.08, BNP 502 Labs (12/16): K 4.9, creatinine 1.57 Labs (5/17): K 5.2, creatinine 1.3 Labs (8/17): BNP 152 Labs (11/17): K 4.9, creatinine 1.32, BNP 236 Labs (8/18): K 4.6, creatinine 1.57 Labs (11/18): K 3.8, creatinine 1.29 Labs (2/19): K 5.1, creatinine 1.44 Labs (9/19): K 3.8, creatinine 1.08 Labs (11/19): K 4.3, creatinine 1.3 Labs (1/20): K 4.8, creatinine 1.68 Labs (3/20): K 4.4, creatinine 1.5 Labs (9/20): K 4.4,  creatinine 1.77 => 1.48, LDL 109 Labs (10/20): K 4.6, creatinine 1.66 Labs (12/20): K 4.7, creatinine 1.59 Labs (3/21): K 4.5, creatinine 1.73 Labs (6/21): K 4.6, creatinine 1.7 Labs (10/21): TSH normal, K 4.2, creatinine 1.37 Labs (3/22): K 4.7, creatinine 1.44 Labs (5/22): K 4.2, creatinine 1.75 Labs (9/22): K 4.5, creatinine 2.12 => 1.81 Labs (12/22): K 4, creatinine 1.31 Labs (3/23): K 4.2, creatinine 1.75, BNP 1276 Labs (6/23): TSH  normal Labs (8/23): K 6.1 => 5.2, creatinine 1.95 => 2.88 => 2.21, BUN 120 => 111  PMH: 1. Cardiomyopathy: Nonischemic.  Echo (9/14) with EF 40-45%.  Echo (2/16) with EF 25-30%.  Echo (4/16) with EF 30-35%, moderate MR.  Echo (6/16) with EF 40%, moderate LV dilation, moderate diastolic dysfunction, moderate MR, moderate TR, PA systolic pressure 52 mmHg. RHC/LHC (3/16) with no significant CAD, EF 20-25%, 3+ MR; mean RA 13, PA 70/30 mean 45, mean PCWP 31 mmHg, CI 2.2, PVR 3.1 WU.  Echo (12/16) with EF 45%, diffuse hypokinesis, moderate MR, normal RV size and systolic function.  - Echo (11/17): EF 45%, moderately dilated LV, moderate eccentric posterior MR, PASP 42 mmHg, normal RV size and systolic function.  - Echo (11/18): EF 45%, mild LV dilation, moderate diastolic dysfunction, normal RV size with mildly decreased systolic function, PASP 50 mmHg, moderate MR.  - Echo (11/19): EF 30-35%, mild LV dilation, severe LAE, mild-moderately decreased RV systolic function, possible severe MR with restricted posterior leaflet, PASP 50 mmHg.  - Echo (1/20): EF 30-35%, severe LV dilation, s/p Mitraclip with mild MR and mild MS, PASP 48 mmHg.  - RHC (1/20, with Mitraclip): mean RA 10, mean LA 18 - Echo (2/20): EF 30-35%, s/p mitraclip with mild mitral stenosis, mean MV gradient 5 mmHg, moderate MR, PASP 56 mmHg.  - Echo (9/20): EF 30-35%, severe LAE, normal RV, s/p Mitraclip with mild-moderate MR and mild MS.  The IVC was dilated, suggesting volume overload.  - Echo (1/21): EF 40-45%, RV normal, biatrial dilation, mild mitral stenosis mean 5 mmHg, mild-moderate MR (s/p Mitraclip).  - Echo (1/22): EF 40-45% with moderate LV dilation, normal RV size and systolic function, severe LAE, s/p Mitraclip with probably mild mitral stenosis (mean gradient 7, MVA 2.2 cm^2 by PHT), moderate MR. - Echo (3/23): EF 40-45%, moderate LV dilation, normal RV, normal IVC, s/p MV repair with Mitraclip with mild mitral stenosis mean  gradient 7 mmHg, moderate MR.  - Echo (9/23): EF 40-45%, grade I DD, normal RV, s/p MC repair with Mitraclip with mild mitral stenosis mean gradient 7 mmHg, moderate MR. 2. PVCs: Holter (10/15) with 12% PVCs. 3. Pulmonary venous hypertension: CTA chest (7/16) negative for PE. PFTs (7/16) with minimal obstruction, mild restriction, moderately decreased DLCO.  4. Thyroid nodules: Biopsy benign.  5. CKD stage 3: Episodes of AKI/hyperkalemia in 8/23.  6. Mitral regurgitation: TEE (11/19) with EF 30-35%, moderate LV dilation, severe MR with posterior leaflet restriction and ERO 0.41 cm^2, no mitral stenosis, mild to moderately decreased RV systolic function, peak RV-RA gradient 50 mmHg.  - Mitraclip placement x 2 in 1/20 with 1+ residual MR.  - Echo (2/20) with moderate residual MR, mild MS with mean gradient 5 mmHg.  - Echo (9/20) with mild-moderate MR, mild MS.  - Echo (1/21) with mild-moderate MR, mild MS s/p Mitraclip - Echo (1/22) with moderate residual MR, mild-moderate mitral stenosis.  - Echo (3/23) with moderate MR, mild MS s/p Mitraclip.  7. Hyperthyroism: Toxic multinodular goiter.   SH: Lives  alone, only child passed away in 06/16/2023, nonsmoker, no ETOH or drugs.   FH: Father with MIs  ROS: All systems reviewed and negative except as per HPI.   Current Outpatient Medications  Medication Sig Dispense Refill   aspirin 81 MG EC tablet Take 1 tablet (81 mg total) by mouth daily.     Calcium Carb-Cholecalciferol (CALCIUM + VITAMIN D3 PO) Take 1 tablet by mouth daily.     carvedilol (COREG) 3.125 MG tablet Take 1 tablet (3.125 mg total) by mouth 2 (two) times daily. 60 tablet 3   empagliflozin (JARDIANCE) 10 MG TABS tablet TAKE 1 TABLET BY MOUTH ONCE DAILY BEFORE BREAKFAST 90 tablet 3   ibandronate (BONIVA) 150 MG tablet Take 150 mg by mouth every 30 (thirty) days.     torsemide (DEMADEX) 20 MG tablet Take 0.5 tablets (10 mg total) by mouth daily. 45 tablet 0   No current  facility-administered medications for this encounter.   BP 132/76   Pulse 86   Ht 5\' 3"  (1.6 m)   Wt 82.9 kg (182 lb 12.8 oz)   SpO2 97%   BMI 32.38 kg/m    Wt Readings from Last 3 Encounters:  08/12/22 82.9 kg (182 lb 12.8 oz)  07/27/22 82.9 kg (182 lb 12.8 oz)  07/24/22 85.7 kg (189 lb)   Physical Exam: General:  NAD. No resp difficulty, walked into clinic. HEENT: Normal Neck: Supple. No JVD. Carotids 2+ bilat; no bruits. No lymphadenopathy or thryomegaly appreciated. Cor: PMI nondisplaced. Regular rate & rhythm. No rubs, gallops, 2/6 HSM apex Lungs: Clear Abdomen: Soft, nontender, nondistended. No hepatosplenomegaly. No bruits or masses. Good bowel sounds. Extremities: No cyanosis, clubbing, rash, edema Neuro: Alert & oriented x 3, cranial nerves grossly intact. Moves all 4 extremities w/o difficulty. Affect pleasant.  Assessment/Plan: 1. Chronic systolic CHF: Nonischemic cardiomyopathy.  EF up to 45% on 12/16, repeat echo 11/18 shows that EF remains about 45%.  Etiology of NICM still unclear. Had frequent PVCs by past holter (12%) but doesn't explain her cardiomyopathy. Had improvement of palpitations on Coreg. Cannot rule out myocarditis. May also have component of Takotsubo with passing of child but EF has not improved as completely as would be expected with Takotsubo. Have previously discussed cardiac MRI to look for infiltrative disease but she says that she would be too claustrophobic.  Echo in 11/19 showed EF down to 30-35% and mild to moderate RV systolic function.  There also appeared to be severe mitral regurgitation, which seemed progressive from the past. TEE in 11/19 confirmed severe MR with restricted posterior leaflet => Mitraclip 1/20 with decrease in MR to 1+.  Echo in 1/20 showed EF 30-35% with mild MR s/p Mitraclip. Echo in 9/20 with stable EF 30-35% but dilated IVC suggestive of volume overload.  Echo in 1/22 with EF 40-45%, normal RV.  Echo in 3/23 showed  EF 40-45%,  moderate LV dilation, normal RV, normal IVC, s/p MV repair with Mitraclip with mild mitral stenosis mean gradient 7 mmHg, moderate MR. Echo (9/23) showed EF stable 40-45%, mild MS (mean gradient 7 mmHg) and mild to moderate MR s/p Mitraclip. NYHA class II, not volume overloaded on exam.  Not orthostatic after multiple meds stopped with recent AKI.  - Increase Coreg to 6.25 mg bid. - Continue torsemide 10 mg daily.  BMET/BNP today.  - Continue empagliflozin 10 mg daily.      - She will stay off spironolactone and Entresto for now.  Of note, she did not  tolerate Bidil in the past.    - EF now above ICD range.   2. PVCs: Frequent (12%) on 10/15 holter.   Pt refused repeat holter monitor for further assessment. PVC count was probably not high enough to cause cardiomyopathy, unlikely primary etiology. - Improved palpitations on Coreg => restarting low dose today.   3. Pulmonary hypertension: Primarily pulmonary venous HTN (PVR 3.1) from elevated left atrial pressure (previous RHC above).   - Best treatment remains adequate diuresis. 4. Mitral regurgitation:  TEE in 11/19 with severe MR, restricted posterior leaflet.  She had Mitraclip in 1/20. Post-op echo showed mild MR and mild mitral stenosis.  Echo in 1/22 showed moderate MR, mild-moderate MS. Echo in 3/23 showed moderate MR, mild-moderate MS. Echo 9/23 showed moderate MR, mild MS (mean gradient 7 mmHg). - Will need antibiotics for dental prophylaxis.  5. 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  6. 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  She is now off Entresto and spironolactone. She has been referred to Nephrology. - BMET today.    Follow up in 4 weeks with APP (add back low dose Entresto) and 12 weeks with Dr. Aundra Dubin.  Signed, Rafael Bihari, FNP  08/12/2022  [facility] [address] [facility] [place] [address] [phone]  (office) [phone] (fax)

## 2022-08-12 NOTE — Patient Instructions (Signed)
Labs done today. We will contact you only if your labs are abnormal.  INCREASE Carvedilol to 6.25mg  (1 tablet) by mouth 2 times daily.   No other medication changes were made. Please continue all current medications as prescribed.  Your physician recommends that you schedule a follow-up appointment in: 4 weeks with our NP/PA clinic and in 3 months with Dr. Aundra Dubin.  If you have any questions or concerns before your next appointment please send [place] a message through [place] or call our office at [phone].    TO LEAVE A MESSAGE FOR THE NURSE SELECT OPTION 2, PLEASE LEAVE A MESSAGE INCLUDING: YOUR NAME DATE OF BIRTH CALL BACK NUMBER REASON FOR CALL**this is important as we prioritize the call backs  YOU WILL RECEIVE A CALL BACK THE SAME DAY AS LONG AS YOU CALL BEFORE 4:00 PM   Do the following things EVERYDAY: Weigh yourself in the morning before breakfast. Write it down and keep it in a log. Take your medicines as prescribed Eat low salt foods--Limit salt (sodium) to 2000 mg per day.  Stay as active as you can everyday Limit all fluids for the day to less than 2 liters   At the [facility], you and your health needs are our priority. As part of our continuing mission to provide you with exceptional heart care, we have created designated Provider Care Teams. These Care Teams include your primary Cardiologist (physician) and Advanced Practice Providers (APPs- Physician Assistants and Nurse Practitioners) who all work together to provide you with the care you need, when you need it.   You may see any of the following providers on your designated Care Team at your next follow up: Dr Glori Bickers Dr Haynes Kerns, NP Lyda Jester, [place] Audry Riles, PharmD   Please be sure to bring in all your medications bottles to every appointment.

## 2022-08-14 ENCOUNTER — Telehealth (HOSPITAL_COMMUNITY): Payer: Self-pay | Admitting: Surgery

## 2022-08-14 DIAGNOSIS — I5022 Chronic systolic (congestive) heart failure: Secondary | ICD-10-CM

## 2022-08-14 MED ORDER — TORSEMIDE 20 MG PO TABS: 20.0000 mg | ORAL_TABLET | Freq: Every day | ORAL | 0 refills | Status: DC

## 2022-08-14 NOTE — Telephone Encounter (Signed)
-----   Message from Rafael Bihari, [place] sent at 08/14/2022  7:50 AM EDT ----- Kidney function stable. BNP remains elevated.   Please increase torsemide to 20 mg daily.  Repeat BMET in 10-14 days please

## 2022-08-14 NOTE — Telephone Encounter (Signed)
I called patient to review results and recommendations per rpovider.  I left a message for a return call.

## 2022-08-14 NOTE — Telephone Encounter (Signed)
Patient called back and I provided results and recommendations per provider.  She is aware and agreeable and will return to have repeat lab work. Orders placed in [facility] and medlist updated.

## 2022-08-14 NOTE — Telephone Encounter (Signed)
-----   Message from Rafael Bihari, [place] sent at 08/14/2022  7:50 AM EDT ----- Kidney function stable. BNP remains elevated.   Please increase torsemide to 20 mg daily.  Repeat BMET in 10-14 days please

## 2022-08-25 ENCOUNTER — Ambulatory Visit (HOSPITAL_COMMUNITY)
Admission: RE | Admit: 2022-08-25 | Discharge: 2022-08-25 | Disposition: A | Discharge status: Home / Self Care | Payer: PPO | Source: Ambulatory Visit | Attending: Cardiology | Admitting: Cardiology

## 2022-08-25 DIAGNOSIS — I5022 Chronic systolic (congestive) heart failure: Secondary | ICD-10-CM | POA: Insufficient documentation

## 2022-08-25 LAB — BASIC METABOLIC PANEL
Anion gap: 8 (ref 5–15)
BUN: 47 mg/dL — ABNORMAL HIGH (ref 8–23)
CO2: 22 mmol/L (ref 22–32)
Calcium: 9 mg/dL (ref 8.9–10.3)
Chloride: 108 mmol/L (ref 98–111)
Creatinine, Ser: 1.53 mg/dL — ABNORMAL HIGH (ref 0.44–1.00)
GFR, Estimated: 36 mL/min — ABNORMAL LOW (ref >60)
Glucose, Bld: 109 mg/dL — ABNORMAL HIGH (ref 70–99)
Potassium: 4 mmol/L (ref 3.5–5.1)
Sodium: 138 mmol/L (ref 135–145)

## 2022-09-01 ENCOUNTER — Encounter (HOSPITAL_COMMUNITY): Payer: Self-pay

## 2022-09-01 ENCOUNTER — Ambulatory Visit (HOSPITAL_COMMUNITY)
Admission: RE | Admit: 2022-09-01 | Discharge: 2022-09-01 | Disposition: A | Discharge status: Home / Self Care | Payer: PPO | Source: Ambulatory Visit | Attending: Family Medicine | Admitting: Family Medicine

## 2022-09-01 ENCOUNTER — Telehealth (HOSPITAL_COMMUNITY): Payer: Self-pay | Admitting: Cardiology

## 2022-09-01 VITALS — BP 122/70 | HR 74 | Wt 179.2 lb

## 2022-09-01 DIAGNOSIS — R0781 Pleurodynia: Secondary | ICD-10-CM

## 2022-09-01 DIAGNOSIS — E041 Nontoxic single thyroid nodule: Secondary | ICD-10-CM | POA: Diagnosis not present

## 2022-09-01 DIAGNOSIS — I272 Pulmonary hypertension, unspecified: Secondary | ICD-10-CM | POA: Diagnosis not present

## 2022-09-01 DIAGNOSIS — Z7984 Long term (current) use of oral hypoglycemic drugs: Secondary | ICD-10-CM | POA: Diagnosis not present

## 2022-09-01 DIAGNOSIS — N184 Chronic kidney disease, stage 4 (severe): Secondary | ICD-10-CM | POA: Diagnosis not present

## 2022-09-01 DIAGNOSIS — I428 Other cardiomyopathies: Secondary | ICD-10-CM | POA: Insufficient documentation

## 2022-09-01 DIAGNOSIS — N183 Chronic kidney disease, stage 3 unspecified: Secondary | ICD-10-CM | POA: Diagnosis not present

## 2022-09-01 DIAGNOSIS — W19XXXA Unspecified fall, initial encounter: Secondary | ICD-10-CM | POA: Insufficient documentation

## 2022-09-01 DIAGNOSIS — R079 Chest pain, unspecified: Secondary | ICD-10-CM | POA: Diagnosis not present

## 2022-09-01 DIAGNOSIS — R0602 Shortness of breath: Secondary | ICD-10-CM | POA: Diagnosis not present

## 2022-09-01 DIAGNOSIS — R002 Palpitations: Secondary | ICD-10-CM | POA: Insufficient documentation

## 2022-09-01 DIAGNOSIS — I493 Ventricular premature depolarization: Secondary | ICD-10-CM | POA: Diagnosis not present

## 2022-09-01 DIAGNOSIS — I5022 Chronic systolic (congestive) heart failure: Secondary | ICD-10-CM | POA: Insufficient documentation

## 2022-09-01 DIAGNOSIS — R071 Chest pain on breathing: Secondary | ICD-10-CM

## 2022-09-01 DIAGNOSIS — Z79899 Other long term (current) drug therapy: Secondary | ICD-10-CM | POA: Insufficient documentation

## 2022-09-01 DIAGNOSIS — I130 Hypertensive heart and chronic kidney disease with heart failure and stage 1 through stage 4 chronic kidney disease, or unspecified chronic kidney disease: Secondary | ICD-10-CM | POA: Diagnosis not present

## 2022-09-01 DIAGNOSIS — I052 Rheumatic mitral stenosis with insufficiency: Secondary | ICD-10-CM | POA: Insufficient documentation

## 2022-09-01 DIAGNOSIS — E0520 Thyrotoxicosis with toxic multinodular goiter without thyrotoxic crisis or storm: Secondary | ICD-10-CM | POA: Insufficient documentation

## 2022-09-01 DIAGNOSIS — R0609 Other forms of dyspnea: Secondary | ICD-10-CM | POA: Diagnosis not present

## 2022-09-01 DIAGNOSIS — Z9889 Other specified postprocedural states: Secondary | ICD-10-CM

## 2022-09-01 LAB — CBC
HCT: 32.3 % — ABNORMAL LOW (ref 36.0–46.0)
Hemoglobin: 10.4 g/dL — ABNORMAL LOW (ref 12.0–15.0)
MCH: 29.9 pg (ref 26.0–34.0)
MCHC: 32.2 g/dL (ref 30.0–36.0)
MCV: 92.8 fL (ref 80.0–100.0)
Platelets: 205 10*3/uL (ref 150–400)
RBC: 3.48 MIL/uL — ABNORMAL LOW (ref 3.87–5.11)
RDW: 13.2 % (ref 11.5–15.5)
WBC: 13.3 10*3/uL — ABNORMAL HIGH (ref 4.0–10.5)
nRBC: 0 % (ref 0.0–0.2)

## 2022-09-01 LAB — BASIC METABOLIC PANEL
Anion gap: 9 (ref 5–15)
BUN: 45 mg/dL — ABNORMAL HIGH (ref 8–23)
CO2: 24 mmol/L (ref 22–32)
Calcium: 8.8 mg/dL — ABNORMAL LOW (ref 8.9–10.3)
Chloride: 104 mmol/L (ref 98–111)
Creatinine, Ser: 1.66 mg/dL — ABNORMAL HIGH (ref 0.44–1.00)
GFR, Estimated: 33 mL/min — ABNORMAL LOW (ref >60)
Glucose, Bld: 159 mg/dL — ABNORMAL HIGH (ref 70–99)
Potassium: 4.6 mmol/L (ref 3.5–5.1)
Sodium: 137 mmol/L (ref 135–145)

## 2022-09-01 LAB — D-DIMER, QUANTITATIVE: D-Dimer, Quant: 6.68 ug/mL-FEU — ABNORMAL HIGH (ref 0.00–0.50)

## 2022-09-01 LAB — BRAIN NATRIURETIC PEPTIDE: B Natriuretic Peptide: 1132.4 pg/mL — ABNORMAL HIGH (ref 0.0–100.0)

## 2022-09-01 NOTE — Telephone Encounter (Signed)
Patient called.  Patient aware.  

## 2022-09-01 NOTE — Progress Notes (Signed)
ReDS Vest / Clip - 09/01/22 1200       ReDS Vest / Clip   Station Marker A    Ruler Value 27    ReDS Value Range Moderate volume overload    ReDS Actual Value 36

## 2022-09-01 NOTE — Patient Instructions (Signed)
INCREASE Torsemide to 40 mg daily for 3 days, then resume normal dose of 20 mg daily thereafter  Labs today We will only contact you if something comes back abnormal or we need to make some changes. Otherwise no news is good news!  A chest x-ray takes a picture of the organs and structures inside the chest, including the heart, lungs, and blood vessels. This test can show several things, including, whether the heart is enlarges; whether fluid is building up in the lungs; and whether pacemaker / defibrillator leads are still in place.   Your physician recommends that you schedule a follow-up appointment in: 4 weeks  in the Advanced Practitioners (PA/NP) Clinic and keep follow up as scheduled with Dr Aundra Dubin   Do the following things EVERYDAY: Weigh yourself in the morning before breakfast. Write it down and keep it in a log. Take your medicines as prescribed Eat low salt foods--Limit salt (sodium) to 2000 mg per day.  Stay as active as you can everyday Limit all fluids for the day to less than 2 liters  At the [facility], you and your health needs are our priority. As part of our continuing mission to provide you with exceptional heart care, we have created designated Provider Care Teams. These Care Teams include your primary Cardiologist (physician) and Advanced Practice Providers (APPs- Physician Assistants and Nurse Practitioners) who all work together to provide you with the care you need, when you need it.   You may see any of the following providers on your designated Care Team at your next follow up: Dr Glori Bickers Dr [name] Dr. Roxana Hires, NP Lyda Jester, [place] [facility] [place], [place] Forestine Na, NP Audry Riles, PharmD   Please be sure to bring in all your medications bottles to every appointment.   If you have any questions or concerns before your next appointment please send [place] a message through [place] or call  our office at [phone].    TO LEAVE A MESSAGE FOR THE NURSE SELECT OPTION 2, PLEASE LEAVE A MESSAGE INCLUDING: YOUR NAME DATE OF BIRTH CALL BACK NUMBER REASON FOR CALL**this is important as we prioritize the call backs  YOU WILL RECEIVE A CALL BACK THE SAME DAY AS LONG AS YOU CALL BEFORE 4:00 PM

## 2022-09-01 NOTE — Progress Notes (Signed)
ID:  Vanessa [name], DOB July 25, 1951, MRN [id number]   Provider location: [facility] [place] [facility] Type of Visit: Established patient   PCP:  Vanessa Odor, PA  HF Cardiologist: Dr. Aundra [name]   History of Present Illness: Vanessa [name] is a 71 y.o. with history of nonischemic cardiomyopathy and presents for followup of CHF. Patient has had a low EF recognized since at least 9/14, when echo showed EF 40-45%.  In 2023/06/22, her child died, which led to significant stress.  She developed worsening exertional dyspnea and subsequent echo showed a fall in EF to 25-30%.  She had left and right heart catheterization in 3/16 with no significant coronary disease detected and moderately elevated right and left heart filling pressures along with pulmonary venous hypertension.  Echo was done 12/16: EF 45%, diffuse hypokinesis. She had a repeat echo 11/17 => EF 45% with moderately dilated LV, moderate MR.  Echo 11/18 showed EF stable at 45% with mild LV dilation and mildly decreased RV systolic function, moderate MR.   Spironolactone was stopped due to elevated K. She apparently did not tolerate Veltassa well.   Echo was done in 11/19.  EF 30-35% with mild LV hypertrophy, mild-moderately decreased RV systolic function, possible severe MR with restricted posterior leaflet, PASP 50 mmHg, severe LAE.  TEE was done in 11/19 to assess mitral regurgitation.  This showed severe MR with restricted posterior leaflet.   In 1/20, she had Mitraclip with reduction in MR to 1+.    She had echo in 9/20 with EF 30-35%, severe LAE, normal RV, s/p Mitraclip with mild-moderate MR and mild MS.  The IVC was dilated, suggesting volume overload.   She was unable to tolerate Bidil due to lightheadedness.   Echo in 1/21 showed EF 40-45%, RV normal, biatrial dilation, mild mitral stenosis mean 5 mmHg, mild-moderate MR (s/p Mitraclip).  Echo in 1/22 showed EF 40-45% with moderate LV dilation, normal RV size and systolic  function, severe LAE, s/p Mitraclip with probably mild mitral stenosis (mean gradient 7, MVA 2.2 cm^2 by PHT), moderate MR.   Echo in 3/23 showed EF 40-45%, moderate LV dilation, normal RV, normal IVC, s/p MV repair with Mitraclip with mild mitral stenosis mean gradient 7 mmHg, moderate MR.   Patient had labs done in 8/23 showing K up to 6.1 with AKI/creatinine 2.88.  She was admitted and K was treated.  Entresto, spironolactone, Coreg, and Veltassa were stopped.  Torsemide was decreased to 10 mg daily.   Echo (9/23) showed EF stable 40-45%, mild MS (mean gradient 7 mmHg) and mild to moderate MR s/p Mitraclip.   Today she returns for HF follow up. Overall feeling fine. Main complaint is R upper chest discomfort and right neck pain, started last night and she did not sleep well. She vomited once this morning after drinking a glass of cold water. No cough or recent travel.Denies heartburn symptoms. She is not short of breath with walking or ADLs. Denies palpitations, abnormal bleeding, dizziness, edema, or PND/Orthopnea. Appetite ok. No fever or chills. Weight at home 177 pounds. Taking all medications. Asking for pain medication.  ECG (personally reviewed): NSR, TWI in lateral leads  ReDs: 36%  Labs (7/16): K 4.5, creatinine 1.12, BNP 596 Labs (8/16): K 4 => 5.1, creatinine 1.29 => 1.5, BNP 388 => 426 Labs (9/16): K 4, creatinine 1.08, BNP 502 Labs (12/16): K 4.9, creatinine 1.57 Labs (5/17): K 5.2, creatinine 1.3 Labs (8/17): BNP 152 Labs (11/17):  K 4.9, creatinine 1.32, BNP 236 Labs (8/18): K 4.6, creatinine 1.57 Labs (11/18): K 3.8, creatinine 1.29 Labs (2/19): K 5.1, creatinine 1.44 Labs (9/19): K 3.8, creatinine 1.08 Labs (11/19): K 4.3, creatinine 1.3 Labs (1/20): K 4.8, creatinine 1.68 Labs (3/20): K 4.4, creatinine 1.5 Labs (9/20): K 4.4, creatinine 1.77 => 1.48, LDL 109 Labs (10/20): K 4.6, creatinine 1.66 Labs (12/20): K 4.7, creatinine 1.59 Labs (3/21): K 4.5, creatinine  1.73 Labs (6/21): K 4.6, creatinine 1.7 Labs (10/21): TSH normal, K 4.2, creatinine 1.37 Labs (3/22): K 4.7, creatinine 1.44 Labs (5/22): K 4.2, creatinine 1.75 Labs (9/22): K 4.5, creatinine 2.12 => 1.81 Labs (12/22): K 4, creatinine 1.31 Labs (3/23): K 4.2, creatinine 1.75, BNP 1276 Labs (6/23): TSH normal Labs (8/23): K 6.1 => 5.2, creatinine 1.95 => 2.88 => 2.21, BUN 120 => 111  PMH: 1. Cardiomyopathy: Nonischemic.  Echo (9/14) with EF 40-45%.  Echo (2/16) with EF 25-30%.  Echo (4/16) with EF 30-35%, moderate MR.  Echo (6/16) with EF 40%, moderate LV dilation, moderate diastolic dysfunction, moderate MR, moderate TR, PA systolic pressure 52 mmHg. RHC/LHC (3/16) with no significant CAD, EF 20-25%, 3+ MR; mean RA 13, PA 70/30 mean 45, mean PCWP 31 mmHg, CI 2.2, PVR 3.1 WU.  Echo (12/16) with EF 45%, diffuse hypokinesis, moderate MR, normal RV size and systolic function.  - Echo (11/17): EF 45%, moderately dilated LV, moderate eccentric posterior MR, PASP 42 mmHg, normal RV size and systolic function.  - Echo (11/18): EF 45%, mild LV dilation, moderate diastolic dysfunction, normal RV size with mildly decreased systolic function, PASP 50 mmHg, moderate MR.  - Echo (11/19): EF 30-35%, mild LV dilation, severe LAE, mild-moderately decreased RV systolic function, possible severe MR with restricted posterior leaflet, PASP 50 mmHg.  - Echo (1/20): EF 30-35%, severe LV dilation, s/p Mitraclip with mild MR and mild MS, PASP 48 mmHg.  - RHC (1/20, with Mitraclip): mean RA 10, mean LA 18 - Echo (2/20): EF 30-35%, s/p mitraclip with mild mitral stenosis, mean MV gradient 5 mmHg, moderate MR, PASP 56 mmHg.  - Echo (9/20): EF 30-35%, severe LAE, normal RV, s/p Mitraclip with mild-moderate MR and mild MS.  The IVC was dilated, suggesting volume overload.  - Echo (1/21): EF 40-45%, RV normal, biatrial dilation, mild mitral stenosis mean 5 mmHg, mild-moderate MR (s/p Mitraclip).  - Echo (1/22): EF 40-45% with  moderate LV dilation, normal RV size and systolic function, severe LAE, s/p Mitraclip with probably mild mitral stenosis (mean gradient 7, MVA 2.2 cm^2 by PHT), moderate MR. - Echo (3/23): EF 40-45%, moderate LV dilation, normal RV, normal IVC, s/p MV repair with Mitraclip with mild mitral stenosis mean gradient 7 mmHg, moderate MR.  - Echo (9/23): EF 40-45%, grade I DD, normal RV, s/p MC repair with Mitraclip with mild mitral stenosis mean gradient 7 mmHg, moderate MR. 2. PVCs: Holter (10/15) with 12% PVCs. 3. Pulmonary venous hypertension: CTA chest (7/16) negative for PE. PFTs (7/16) with minimal obstruction, mild restriction, moderately decreased DLCO.  4. Thyroid nodules: Biopsy benign.  5. CKD stage 3: Episodes of AKI/hyperkalemia in 8/23.  6. Mitral regurgitation: TEE (11/19) with EF 30-35%, moderate LV dilation, severe MR with posterior leaflet restriction and ERO 0.41 cm^2, no mitral stenosis, mild to moderately decreased RV systolic function, peak RV-RA gradient 50 mmHg.  - Mitraclip placement x 2 in 1/20 with 1+ residual MR.  - Echo (2/20) with moderate residual MR, mild MS with mean gradient 5  mmHg.  - Echo (9/20) with mild-moderate MR, mild MS.  - Echo (1/21) with mild-moderate MR, mild MS s/p Mitraclip - Echo (1/22) with moderate residual MR, mild-moderate mitral stenosis.  - Echo (3/23) with moderate MR, mild MS s/p Mitraclip.  7. Hyperthyroism: Toxic multinodular goiter.   SH: Lives alone, only child passed away in 06/19/23, nonsmoker, no ETOH or drugs.   FH: Father with MIs  ROS: All systems reviewed and negative except as per HPI.   Current Outpatient Medications  Medication Sig Dispense Refill   aspirin 81 MG EC tablet Take 1 tablet (81 mg total) by mouth daily.     Calcium Carb-Cholecalciferol (CALCIUM + VITAMIN D3 PO) Take 1 tablet by mouth daily.     carvedilol (COREG) 6.25 MG tablet Take 1 tablet (6.25 mg total) by mouth 2 (two) times daily. 180 tablet 3   empagliflozin  (JARDIANCE) 10 MG TABS tablet TAKE 1 TABLET BY MOUTH ONCE DAILY BEFORE BREAKFAST 90 tablet 3   ibandronate (BONIVA) 150 MG tablet Take 150 mg by mouth every 30 (thirty) days.     torsemide (DEMADEX) 20 MG tablet Take 1 tablet (20 mg total) by mouth daily. 45 tablet 0   No current facility-administered medications for this encounter.   BP 122/70   Pulse 74   Wt 81.3 kg (179 lb 3.2 oz)   SpO2 98%   BMI 31.74 kg/m    Wt Readings from Last 3 Encounters:  09/01/22 81.3 kg (179 lb 3.2 oz)  08/12/22 82.9 kg (182 lb 12.8 oz)  07/27/22 82.9 kg (182 lb 12.8 oz)   Physical Exam: General:  NAD. No resp difficulty, walked into clinic. HEENT: Normal; + TTP on right lateral neck Neck: Supple. No JVD. Carotids 2+ bilat; no bruits. No lymphadenopathy or thryomegaly appreciated. Cor: PMI nondisplaced. Regular rate & rhythm. No rubs, gallops, 2/6 HSM apex Lungs: Clear Abdomen: Soft, nontender, nondistended. No hepatosplenomegaly. No bruits or masses. Good bowel sounds. Extremities: No cyanosis, clubbing, rash, edema Neuro: Alert & oriented x 3, cranial nerves grossly intact. Moves all 4 extremities w/o difficulty. Affect pleasant.  Assessment/Plan: 1. Chronic systolic CHF: Nonischemic cardiomyopathy.  EF up to 45% on 12/16, repeat echo 11/18 shows that EF remains about 45%.  Etiology of NICM still unclear. Had frequent PVCs by past holter (12%) but doesn't explain her cardiomyopathy. Had improvement of palpitations on Coreg. Cannot rule out myocarditis. May also have component of Takotsubo with passing of child but EF has not improved as completely as would be expected with Takotsubo. Have previously discussed cardiac MRI to look for infiltrative disease but she says that she would be too claustrophobic.  Echo in 11/19 showed EF down to 30-35% and mild to moderate RV systolic function.  There also appeared to be severe mitral regurgitation, which seemed progressive from the past. TEE in 11/19 confirmed  severe MR with restricted posterior leaflet => Mitraclip 1/20 with decrease in MR to 1+.  Echo in 1/20 showed EF 30-35% with mild MR s/p Mitraclip. Echo in 9/20 with stable EF 30-35% but dilated IVC suggestive of volume overload.  Echo in 1/22 with EF 40-45%, normal RV.  Echo in 3/23 showed  EF 40-45%, moderate LV dilation, normal RV, normal IVC, s/p MV repair with Mitraclip with mild mitral stenosis mean gradient 7 mmHg, moderate MR. Echo (9/23) showed EF stable 40-45%, mild MS (mean gradient 7 mmHg) and mild to moderate MR s/p Mitraclip. NYHA class II, she is not volume overloaded on exam.  REDs mildly elevated 36%.  GDMT limited by recent AKI. - Increase torsemide to 40 mg daily x 3 days then back to 20 mg daily. BMET/BNP today. - Continue Coreg 6.25 mg bid. - Continue empagliflozin 10 mg daily.      - She will stay off spironolactone and Entresto for now.  Of note, she did not tolerate Bidil in the past.    - EF now above ICD range.   2. PVCs: Frequent (12%) on 10/15 holter.   Pt refused repeat holter monitor for further assessment. PVC count was probably not high enough to cause cardiomyopathy, unlikely primary etiology. - Improved palpitations on Coreg. - None on initial ECG today. She had a 2nd ECG after tech saw abnormal rhythm, and this showed frequent PVCs. She is asymptomatic. Will diurese and re-evaluate next visit.  - Plan on increasing beta blocker next visit. 3. Pulmonary hypertension: Primarily pulmonary venous HTN (PVR 3.1) from elevated left atrial pressure (previous RHC above).   - Best treatment remains adequate diuresis. 4. Mitral regurgitation:  TEE in 11/19 with severe MR, restricted posterior leaflet.  She had Mitraclip in 1/20. Post-op echo showed mild MR and mild mitral stenosis.  Echo in 1/22 showed moderate MR, mild-moderate MS. Echo in 3/23 showed moderate MR, mild-moderate MS. Echo 9/23 showed moderate MR, mild MS (mean gradient 7 mmHg). - Will need antibiotics for dental  prophylaxis.  5. 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  6. 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  She is now off Entresto and spironolactone. She has been referred to Nephrology. - BMET today.  7. Pleuritic chest pain: LHC 2016 without significant CAD. ECG today w/o acute ST changes from prior. ? GERD vs pleuritis vs volume overload.  - Diurese as above. - Check CBC and D dimer. Consider VQ scan vs CT next. - Will get CXR to rule out pneumonia or pleural effusion   Follow up in 3-4 weeks with APP (ReDs and consider repeat Zio) and 2 months with Dr. Aundra [name], as scheduled.  Signed, Rafael Bihari, FNP  09/01/2022  [facility] [place] [address] [facility] and [place] [address] [phone] (office) [phone] (fax)

## 2022-09-01 NOTE — Telephone Encounter (Signed)
-----   Message from Rafael Bihari, [place] sent at 09/01/2022  1:51 PM EDT ----- D Dimer elevated. Will avoid CT chest with elevated creatinine.  Please arrange for V/Q scan to rule out pulmonary embolus.  WBC mildly elevated. Awaiting results of CXR

## 2022-09-09 ENCOUNTER — Encounter (HOSPITAL_COMMUNITY): Payer: PPO

## 2022-09-10 ENCOUNTER — Other Ambulatory Visit (HOSPITAL_COMMUNITY): Payer: Self-pay

## 2022-09-10 ENCOUNTER — Telehealth (HOSPITAL_COMMUNITY): Payer: Self-pay | Admitting: Vascular Surgery

## 2022-09-10 DIAGNOSIS — I5022 Chronic systolic (congestive) heart failure: Secondary | ICD-10-CM

## 2022-09-10 NOTE — Telephone Encounter (Signed)
LVM giving VQ scan appt , 10/19 @ 11 am asked pt to call back to confirm appt

## 2022-09-10 NOTE — Progress Notes (Signed)
Orders Placed This Encounter  Procedures   DG Chest 2 View    Standing Status:   Future    Standing Expiration Date:   09/11/2023    Order Specific Question:   Reason for Exam (SYMPTOM  OR DIAGNOSIS REQUIRED)    Answer:   vq scan    Order Specific Question:   Preferred imaging location?    Answer:   [facility]    Order Specific Question:   Release to patient    Answer:   Immediate

## 2022-09-10 NOTE — Telephone Encounter (Signed)
Pt returned call for VQ scan appt, pt states she is doing fine and does not need this test

## 2022-09-17 ENCOUNTER — Other Ambulatory Visit (HOSPITAL_COMMUNITY): Payer: PPO

## 2022-09-25 DIAGNOSIS — N1832 Chronic kidney disease, stage 3b: Secondary | ICD-10-CM | POA: Diagnosis not present

## 2022-09-25 DIAGNOSIS — E041 Nontoxic single thyroid nodule: Secondary | ICD-10-CM | POA: Diagnosis not present

## 2022-09-25 DIAGNOSIS — Z136 Encounter for screening for cardiovascular disorders: Secondary | ICD-10-CM | POA: Diagnosis not present

## 2022-09-25 DIAGNOSIS — I119 Hypertensive heart disease without heart failure: Secondary | ICD-10-CM | POA: Diagnosis not present

## 2022-09-25 DIAGNOSIS — E0590 Thyrotoxicosis, unspecified without thyrotoxic crisis or storm: Secondary | ICD-10-CM | POA: Diagnosis not present

## 2022-09-25 DIAGNOSIS — R7303 Prediabetes: Secondary | ICD-10-CM | POA: Diagnosis not present

## 2022-09-25 DIAGNOSIS — D6869 Other thrombophilia: Secondary | ICD-10-CM | POA: Diagnosis not present

## 2022-09-25 DIAGNOSIS — Z23 Encounter for immunization: Secondary | ICD-10-CM | POA: Diagnosis not present

## 2022-09-25 DIAGNOSIS — Z0000 Encounter for general adult medical examination without abnormal findings: Secondary | ICD-10-CM | POA: Diagnosis not present

## 2022-09-25 DIAGNOSIS — I509 Heart failure, unspecified: Secondary | ICD-10-CM | POA: Diagnosis not present

## 2022-09-25 DIAGNOSIS — I272 Pulmonary hypertension, unspecified: Secondary | ICD-10-CM | POA: Diagnosis not present

## 2022-09-25 DIAGNOSIS — E7800 Pure hypercholesterolemia, unspecified: Secondary | ICD-10-CM | POA: Diagnosis not present

## 2022-09-25 DIAGNOSIS — I429 Cardiomyopathy, unspecified: Secondary | ICD-10-CM | POA: Diagnosis not present

## 2022-09-29 ENCOUNTER — Telehealth (HOSPITAL_COMMUNITY): Payer: Self-pay

## 2022-09-29 ENCOUNTER — Ambulatory Visit (HOSPITAL_COMMUNITY)
Admission: RE | Admit: 2022-09-29 | Discharge: 2022-09-29 | Disposition: A | Discharge status: Home / Self Care | Payer: PPO | Source: Ambulatory Visit | Attending: Family Medicine | Admitting: Family Medicine

## 2022-09-29 ENCOUNTER — Encounter (HOSPITAL_COMMUNITY): Payer: Self-pay

## 2022-09-29 VITALS — BP 126/78 | HR 72 | Wt 180.8 lb

## 2022-09-29 DIAGNOSIS — E0520 Thyrotoxicosis with toxic multinodular goiter without thyrotoxic crisis or storm: Secondary | ICD-10-CM | POA: Insufficient documentation

## 2022-09-29 DIAGNOSIS — I130 Hypertensive heart and chronic kidney disease with heart failure and stage 1 through stage 4 chronic kidney disease, or unspecified chronic kidney disease: Secondary | ICD-10-CM | POA: Diagnosis not present

## 2022-09-29 DIAGNOSIS — N183 Chronic kidney disease, stage 3 unspecified: Secondary | ICD-10-CM | POA: Diagnosis not present

## 2022-09-29 DIAGNOSIS — I5022 Chronic systolic (congestive) heart failure: Secondary | ICD-10-CM | POA: Diagnosis not present

## 2022-09-29 DIAGNOSIS — I272 Pulmonary hypertension, unspecified: Secondary | ICD-10-CM | POA: Diagnosis not present

## 2022-09-29 DIAGNOSIS — Z7984 Long term (current) use of oral hypoglycemic drugs: Secondary | ICD-10-CM | POA: Diagnosis not present

## 2022-09-29 DIAGNOSIS — R5383 Other fatigue: Secondary | ICD-10-CM | POA: Diagnosis not present

## 2022-09-29 DIAGNOSIS — Z9889 Other specified postprocedural states: Secondary | ICD-10-CM | POA: Diagnosis not present

## 2022-09-29 DIAGNOSIS — I052 Rheumatic mitral stenosis with insufficiency: Secondary | ICD-10-CM | POA: Insufficient documentation

## 2022-09-29 DIAGNOSIS — I428 Other cardiomyopathies: Secondary | ICD-10-CM | POA: Diagnosis not present

## 2022-09-29 DIAGNOSIS — E041 Nontoxic single thyroid nodule: Secondary | ICD-10-CM | POA: Diagnosis not present

## 2022-09-29 DIAGNOSIS — I493 Ventricular premature depolarization: Secondary | ICD-10-CM | POA: Diagnosis not present

## 2022-09-29 DIAGNOSIS — Z79899 Other long term (current) drug therapy: Secondary | ICD-10-CM | POA: Diagnosis not present

## 2022-09-29 MED ORDER — TORSEMIDE 20 MG PO TABS: ORAL_TABLET | ORAL | 0 refills | Status: DC

## 2022-09-29 NOTE — Patient Instructions (Addendum)
Great to see you today! Please begin taking Torsemide 20 mg alternating with 40 mg every other day.  Follow-up appt with Dr. Aundra Dubin as scheduled.  Return for scheduled labwork in 10 days  At the [facility], you and your health needs are our priority. We have a designated team specialized in the treatment of Heart Failure. This Care Team includes your primary Heart Failure Specialized Cardiologist (physician), Advanced Practice Providers (APPs- Physician Assistants and Nurse Practitioners), and Pharmacist who all work together to provide you with the care you need, when you need it.   You may see any of the following providers on your designated Care Team at your next follow up:  Dr. Glori Bickers Dr. [name] Dr. Roxana Hires, NP Lyda Jester, [place] [facility] [place], [place] Forestine Na, NP Audry Riles, PharmD   Please be sure to bring in all your medications bottles to every appointment.   Need to Contact us:  If you have any questions or concerns before your next appointment please send [place] a message through [place] or call our office at [phone].    TO LEAVE A MESSAGE FOR THE NURSE SELECT OPTION 2, PLEASE LEAVE A MESSAGE INCLUDING: YOUR NAME DATE OF BIRTH CALL BACK NUMBER REASON FOR CALL**this is important as we prioritize the call backs  YOU WILL RECEIVE A CALL BACK THE SAME DAY AS LONG AS YOU CALL BEFORE 4:00 PM

## 2022-09-29 NOTE — Progress Notes (Signed)
ReDS Vest / Clip - 09/29/22 0800       ReDS Vest / Clip   Station Marker A    Ruler Value 24    ReDS Value Range Moderate volume overload    ReDS Actual Value 37

## 2022-09-29 NOTE — Addendum Note (Signed)
Encounter addended by: Rockwell Alexandria, CMA on: 09/29/2022 9:56 AM  Actions taken: Charge Capture section accepted

## 2022-09-29 NOTE — Progress Notes (Signed)
ID:  Vanessa [name], DOB 1951/03/10, MRN [id number]   Provider location: [place] [facility] Type of Visit: Established patient   PCP:  Lennie Odor, PA  HF Cardiologist: Dr. Aundra Dubin   HPI: Vanessa [name] is a 71 y.o. with history of nonischemic cardiomyopathy and presents for followup of CHF. Patient has had a low EF recognized since at least 9/14, when echo showed EF 40-45%.  In 07-02-23, her child died, which led to significant stress.  She developed worsening exertional dyspnea and subsequent echo showed a fall in EF to 25-30%.  She had left and right heart catheterization in 3/16 with no significant coronary disease detected and moderately elevated right and left heart filling pressures along with pulmonary venous hypertension.  Echo was done 12/16: EF 45%, diffuse hypokinesis. She had a repeat echo 11/17 => EF 45% with moderately dilated LV, moderate MR.  Echo 11/18 showed EF stable at 45% with mild LV dilation and mildly decreased RV systolic function, moderate MR.   Spironolactone was stopped due to elevated K. She apparently did not tolerate Veltassa well.   Echo was done in 11/19.  EF 30-35% with mild LV hypertrophy, mild-moderately decreased RV systolic function, possible severe MR with restricted posterior leaflet, PASP 50 mmHg, severe LAE.  TEE was done in 11/19 to assess mitral regurgitation.  This showed severe MR with restricted posterior leaflet.   In 1/20, she had Mitraclip with reduction in MR to 1+.    She had echo in 9/20 with EF 30-35%, severe LAE, normal RV, s/p Mitraclip with mild-moderate MR and mild MS.  The IVC was dilated, suggesting volume overload.   She was unable to tolerate Bidil due to lightheadedness.   Echo in 1/21 showed EF 40-45%, RV normal, biatrial dilation, mild mitral stenosis mean 5 mmHg, mild-moderate MR (s/p Mitraclip).  Echo in 1/22 showed EF 40-45% with moderate LV dilation, normal RV size and systolic function, severe LAE,  s/p Mitraclip with probably mild mitral stenosis (mean gradient 7, MVA 2.2 cm^2 by PHT), moderate MR.   Echo in 3/23 showed EF 40-45%, moderate LV dilation, normal RV, normal IVC, s/p MV repair with Mitraclip with mild mitral stenosis mean gradient 7 mmHg, moderate MR.   Patient had labs done in 8/23 showing K up to 6.1 with AKI/creatinine 2.88.  She was admitted and K was treated.  Entresto, spironolactone, Coreg, and Veltassa were stopped.  Torsemide was decreased to 10 mg daily.   Echo (9/23) showed EF stable 40-45%, mild MS (mean gradient 7 mmHg) and mild to moderate MR s/p Mitraclip.   Follow up 10/23, mildly volume up and torsemide increased to 40 mg daily x 3 days then back to 20 mg daily. She c/o of right upper chest pleuritic pain, no increased dyspnea. Arranged for CXR which was clear. D dimer was elevated at 6.68 and VQ scan ordered (avoid chest CT with CKD), however patient declined, saying she felt better.  Today she returns for HF follow up. Overall feeling fine. She is not SOB walking on flat ground, fatigued walking up steps. No further chest discomfort. Denies palpitations, abnormal bleeding, dizziness, edema, or PND/Orthopnea. Appetite ok. No fever or chills. Weight at home 179 pounds. Taking all medications.   ECG (personally reviewed): none ordered today.  ReDs: 36%-->37% today.  Labs (7/16): K 4.5, creatinine 1.12, BNP 596 Labs (8/16): K 4 => 5.1, creatinine 1.29 => 1.5, BNP 388 => 426 Labs (9/16): K 4,  creatinine 1.08, BNP 502 Labs (12/16): K 4.9, creatinine 1.57 Labs (5/17): K 5.2, creatinine 1.3 Labs (8/17): BNP 152 Labs (11/17): K 4.9, creatinine 1.32, BNP 236 Labs (8/18): K 4.6, creatinine 1.57 Labs (11/18): K 3.8, creatinine 1.29 Labs (2/19): K 5.1, creatinine 1.44 Labs (9/19): K 3.8, creatinine 1.08 Labs (11/19): K 4.3, creatinine 1.3 Labs (1/20): K 4.8, creatinine 1.68 Labs (3/20): K 4.4, creatinine 1.5 Labs (9/20): K 4.4, creatinine 1.77 => 1.48, LDL  109 Labs (10/20): K 4.6, creatinine 1.66 Labs (12/20): K 4.7, creatinine 1.59 Labs (3/21): K 4.5, creatinine 1.73 Labs (6/21): K 4.6, creatinine 1.7 Labs (10/21): TSH normal, K 4.2, creatinine 1.37 Labs (3/22): K 4.7, creatinine 1.44 Labs (5/22): K 4.2, creatinine 1.75 Labs (9/22): K 4.5, creatinine 2.12 => 1.81 Labs (12/22): K 4, creatinine 1.31 Labs (3/23): K 4.2, creatinine 1.75, BNP 1276 Labs (6/23): TSH normal Labs (8/23): K 6.1 => 5.2, creatinine 1.95 => 2.88 => 2.21, BUN 120 => 111 Labs (10/23): K 4.6, creatinine 1.66  PMH: 1. Cardiomyopathy: Nonischemic.  Echo (9/14) with EF 40-45%.  Echo (2/16) with EF 25-30%.  Echo (4/16) with EF 30-35%, moderate MR.  Echo (6/16) with EF 40%, moderate LV dilation, moderate diastolic dysfunction, moderate MR, moderate TR, PA systolic pressure 52 mmHg. RHC/LHC (3/16) with no significant CAD, EF 20-25%, 3+ MR; mean RA 13, PA 70/30 mean 45, mean PCWP 31 mmHg, CI 2.2, PVR 3.1 WU.  Echo (12/16) with EF 45%, diffuse hypokinesis, moderate MR, normal RV size and systolic function.  - Echo (11/17): EF 45%, moderately dilated LV, moderate eccentric posterior MR, PASP 42 mmHg, normal RV size and systolic function.  - Echo (11/18): EF 45%, mild LV dilation, moderate diastolic dysfunction, normal RV size with mildly decreased systolic function, PASP 50 mmHg, moderate MR.  - Echo (11/19): EF 30-35%, mild LV dilation, severe LAE, mild-moderately decreased RV systolic function, possible severe MR with restricted posterior leaflet, PASP 50 mmHg.  - Echo (1/20): EF 30-35%, severe LV dilation, s/p Mitraclip with mild MR and mild MS, PASP 48 mmHg.  - RHC (1/20, with Mitraclip): mean RA 10, mean LA 18 - Echo (2/20): EF 30-35%, s/p mitraclip with mild mitral stenosis, mean MV gradient 5 mmHg, moderate MR, PASP 56 mmHg.  - Echo (9/20): EF 30-35%, severe LAE, normal RV, s/p Mitraclip with mild-moderate MR and mild MS.  The IVC was dilated, suggesting volume overload.  -  Echo (1/21): EF 40-45%, RV normal, biatrial dilation, mild mitral stenosis mean 5 mmHg, mild-moderate MR (s/p Mitraclip).  - Echo (1/22): EF 40-45% with moderate LV dilation, normal RV size and systolic function, severe LAE, s/p Mitraclip with probably mild mitral stenosis (mean gradient 7, MVA 2.2 cm^2 by PHT), moderate MR. - Echo (3/23): EF 40-45%, moderate LV dilation, normal RV, normal IVC, s/p MV repair with Mitraclip with mild mitral stenosis mean gradient 7 mmHg, moderate MR.  - Echo (9/23): EF 40-45%, grade I DD, normal RV, s/p MC repair with Mitraclip with mild mitral stenosis mean gradient 7 mmHg, moderate MR. 2. PVCs: Holter (10/15) with 12% PVCs. 3. Pulmonary venous hypertension: CTA chest (7/16) negative for PE. PFTs (7/16) with minimal obstruction, mild restriction, moderately decreased DLCO.  4. Thyroid nodules: Biopsy benign.  5. CKD stage 3: Episodes of AKI/hyperkalemia in 8/23.  6. Mitral regurgitation: TEE (11/19) with EF 30-35%, moderate LV dilation, severe MR with posterior leaflet restriction and ERO 0.41 cm^2, no mitral stenosis, mild to moderately decreased RV systolic function, peak RV-RA gradient  50 mmHg.  - Mitraclip placement x 2 in 1/20 with 1+ residual MR.  - Echo (2/20) with moderate residual MR, mild MS with mean gradient 5 mmHg.  - Echo (9/20) with mild-moderate MR, mild MS.  - Echo (1/21) with mild-moderate MR, mild MS s/p Mitraclip - Echo (1/22) with moderate residual MR, mild-moderate mitral stenosis.  - Echo (3/23) with moderate MR, mild MS s/p Mitraclip.  7. Hyperthyroism: Toxic multinodular goiter.   SH: Lives alone, only child passed away in 07/07/2023, nonsmoker, no ETOH or drugs.   FH: Father with MIs  ROS: All systems reviewed and negative except as per HPI.   Current Outpatient Medications  Medication Sig Dispense Refill   aspirin 81 MG EC tablet Take 1 tablet (81 mg total) by mouth daily.     Calcium Carb-Cholecalciferol (CALCIUM + VITAMIN D3 PO) Take  1 tablet by mouth daily.     carvedilol (COREG) 6.25 MG tablet Take 1 tablet (6.25 mg total) by mouth 2 (two) times daily. 180 tablet 3   empagliflozin (JARDIANCE) 10 MG TABS tablet TAKE 1 TABLET BY MOUTH ONCE DAILY BEFORE BREAKFAST 90 tablet 3   ibandronate (BONIVA) 150 MG tablet Take 150 mg by mouth every 30 (thirty) days.     torsemide (DEMADEX) 20 MG tablet Take 1 tablet (20 mg total) by mouth daily. 45 tablet 0   No current facility-administered medications for this encounter.   BP 126/78   Pulse 72   Wt 82 kg (180 lb 12.8 oz)   SpO2 97%   BMI 32.03 kg/m    Wt Readings from Last 3 Encounters:  09/29/22 82 kg (180 lb 12.8 oz)  09/01/22 81.3 kg (179 lb 3.2 oz)  08/12/22 82.9 kg (182 lb 12.8 oz)   Physical Exam: General:  NAD. No resp difficulty, walked into clinic HEENT: Normal Neck: Supple. JVP 7-8. Carotids 2+ bilat; no bruits. No lymphadenopathy or thryomegaly appreciated. Cor: PMI nondisplaced. Regular rate & rhythm. No rubs, gallops, 2-3/6 HSM apex Lungs: Clear Abdomen: Soft, nontender, nondistended. No hepatosplenomegaly. No bruits or masses. Good bowel sounds. Extremities: No cyanosis, clubbing, rash, edema Neuro: Alert & oriented x 3, cranial nerves grossly intact. Moves all 4 extremities w/o difficulty. Affect pleasant.  Assessment/Plan: 1. Chronic systolic CHF: Nonischemic cardiomyopathy.  EF up to 45% on 12/16, repeat echo 11/18 shows that EF remains about 45%.  Etiology of NICM still unclear. Had frequent PVCs by past holter (12%) but doesn't explain her cardiomyopathy. Had improvement of palpitations on Coreg. Cannot rule out myocarditis. May also have component of Takotsubo with passing of child but EF has not improved as completely as would be expected with Takotsubo. Have previously discussed cardiac MRI to look for infiltrative disease but she says that she would be too claustrophobic.  Echo in 11/19 showed EF down to 30-35% and mild to moderate RV systolic  function.  There also appeared to be severe mitral regurgitation, which seemed progressive from the past. TEE in 11/19 confirmed severe MR with restricted posterior leaflet => Mitraclip 1/20 with decrease in MR to 1+.  Echo in 1/20 showed EF 30-35% with mild MR s/p Mitraclip. Echo in 9/20 with stable EF 30-35% but dilated IVC suggestive of volume overload.  Echo in 1/22 with EF 40-45%, normal RV.  Echo in 3/23 showed  EF 40-45%, moderate LV dilation, normal RV, normal IVC, s/p MV repair with Mitraclip with mild mitral stenosis mean gradient 7 mmHg, moderate MR. Echo (9/23) showed EF stable 40-45%, mild  MS (mean gradient 7 mmHg) and mild to moderate MR s/p Mitraclip. NYHA class II, she is not markedly volume overloaded on exam, but ReDs marginally elevated at 37% and her BNP has been up. GDMT limited by CKD. - Increase torsemide to 40 mg daily alternating with 20 mg every other day. Recent labs stable, repeat BMET and BNP in 10-14 days. - Continue Coreg 6.25 mg bid. - Continue empagliflozin 10 mg daily.   No GU symptoms. - She will stay off spironolactone and Entresto for now.  Of note, she did not tolerate Bidil in the past.    - EF now above ICD range.   2. PVCs: Frequent (12%) on 10/15 holter.   Pt refused repeat holter monitor for further assessment. PVC count was probably not high enough to cause cardiomyopathy, unlikely primary etiology. - Palpitations improved on Coreg. 3. Pulmonary hypertension: Primarily pulmonary venous HTN (PVR 3.1) from elevated left atrial pressure (previous RHC above).   - Best treatment remains adequate diuresis. 4. Mitral regurgitation:  TEE in 11/19 with severe MR, restricted posterior leaflet.  She had Mitraclip in 1/20. Post-op echo showed mild MR and mild mitral stenosis.  Echo in 1/22 showed moderate MR, mild-moderate MS. Echo in 3/23 showed moderate MR, mild-moderate MS. Echo 9/23 showed moderate MR, mild MS (mean gradient 7 mmHg). - Will need antibiotics for dental  prophylaxis.  5. 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  6. 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  She is now off Entresto and spironolactone. She has been referred to Nephrology. - Repeat BMET in 10-14 days with increase in diuretics. 7. Pleuritic chest pain: LHC 2016 without significant CAD. Pain has resolved since last visit, suspect related to volume +/- GERD or MSK pain. I am concerned about her elevated D Dimer, although this may be related to her HF and volume status. HR 79 and O2 97% today. No worsening dyspnea. Discussed completing the VQ scan with her today for completeness, but she is clear she does not want to pursue this testing.   Follow up in 2 months with Dr. Aundra Dubin, as scheduled.  Signed, Rafael Bihari, FNP  09/29/2022  [facility] [address] [facility] [place] [address] [phone] (office) [phone] (fax)

## 2022-09-30 NOTE — Telephone Encounter (Signed)
Encounter signed. 

## 2022-10-07 DIAGNOSIS — Z1211 Encounter for screening for malignant neoplasm of colon: Secondary | ICD-10-CM | POA: Diagnosis not present

## 2022-10-09 ENCOUNTER — Ambulatory Visit (HOSPITAL_COMMUNITY)
Admission: RE | Admit: 2022-10-09 | Discharge: 2022-10-09 | Disposition: A | Discharge status: Home / Self Care | Payer: PPO | Source: Ambulatory Visit | Attending: Cardiology | Admitting: Cardiology

## 2022-10-09 ENCOUNTER — Encounter (HOSPITAL_COMMUNITY): Payer: PPO

## 2022-10-09 DIAGNOSIS — I5022 Chronic systolic (congestive) heart failure: Secondary | ICD-10-CM | POA: Diagnosis not present

## 2022-10-09 LAB — BASIC METABOLIC PANEL
Anion gap: 10 (ref 5–15)
BUN: 61 mg/dL — ABNORMAL HIGH (ref 8–23)
CO2: 25 mmol/L (ref 22–32)
Calcium: 9.1 mg/dL (ref 8.9–10.3)
Chloride: 103 mmol/L (ref 98–111)
Creatinine, Ser: 2.07 mg/dL — ABNORMAL HIGH (ref 0.44–1.00)
GFR, Estimated: 25 mL/min — ABNORMAL LOW (ref >60)
Glucose, Bld: 95 mg/dL (ref 70–99)
Potassium: 4.1 mmol/L (ref 3.5–5.1)
Sodium: 138 mmol/L (ref 135–145)

## 2022-10-09 LAB — BRAIN NATRIURETIC PEPTIDE: B Natriuretic Peptide: 630.4 pg/mL — ABNORMAL HIGH (ref 0.0–100.0)

## 2022-10-13 ENCOUNTER — Other Ambulatory Visit (HOSPITAL_COMMUNITY): Payer: Self-pay

## 2022-10-13 ENCOUNTER — Telehealth (HOSPITAL_COMMUNITY): Payer: Self-pay

## 2022-10-13 DIAGNOSIS — I5022 Chronic systolic (congestive) heart failure: Secondary | ICD-10-CM

## 2022-10-13 NOTE — Telephone Encounter (Signed)
Patient aware. Labs visit set up.

## 2022-10-20 ENCOUNTER — Other Ambulatory Visit (HOSPITAL_COMMUNITY): Payer: Self-pay | Admitting: Cardiology

## 2022-10-20 DIAGNOSIS — I5022 Chronic systolic (congestive) heart failure: Secondary | ICD-10-CM

## 2022-10-30 ENCOUNTER — Ambulatory Visit (HOSPITAL_COMMUNITY)
Admission: RE | Admit: 2022-10-30 | Discharge: 2022-10-30 | Disposition: A | Discharge status: Home / Self Care | Payer: PPO | Source: Ambulatory Visit | Attending: Cardiology | Admitting: Cardiology

## 2022-10-30 DIAGNOSIS — I5022 Chronic systolic (congestive) heart failure: Secondary | ICD-10-CM | POA: Diagnosis not present

## 2022-10-30 LAB — BASIC METABOLIC PANEL
Anion gap: 9 (ref 5–15)
BUN: 70 mg/dL — ABNORMAL HIGH (ref 8–23)
CO2: 25 mmol/L (ref 22–32)
Calcium: 8.9 mg/dL (ref 8.9–10.3)
Chloride: 105 mmol/L (ref 98–111)
Creatinine, Ser: 2.43 mg/dL — ABNORMAL HIGH (ref 0.44–1.00)
GFR, Estimated: 21 mL/min — ABNORMAL LOW (ref >60)
Glucose, Bld: 138 mg/dL — ABNORMAL HIGH (ref 70–99)
Potassium: 4.5 mmol/L (ref 3.5–5.1)
Sodium: 139 mmol/L (ref 135–145)

## 2022-11-03 ENCOUNTER — Telehealth (HOSPITAL_COMMUNITY): Payer: Self-pay | Admitting: *Deleted

## 2022-11-03 DIAGNOSIS — I5022 Chronic systolic (congestive) heart failure: Secondary | ICD-10-CM

## 2022-11-03 MED ORDER — TORSEMIDE 20 MG PO TABS: 20.0000 mg | ORAL_TABLET | Freq: Every day | ORAL | 3 refills | Status: DC

## 2022-11-03 NOTE — Telephone Encounter (Signed)
Called patient per Allena Katz, NP with the following:  1. Kidney function remains elevated.   2. Hold torsemide x 2 days, then resume at lower dose of 20 mg daily.   3. Repeat labs at next clinic visit with Dr. Aundra Dubin.   Pt verbalized understanding of same.

## 2022-11-18 ENCOUNTER — Ambulatory Visit (HOSPITAL_COMMUNITY)
Admission: RE | Admit: 2022-11-18 | Discharge: 2022-11-18 | Disposition: A | Discharge status: Home / Self Care | Payer: PPO | Source: Ambulatory Visit | Attending: Cardiology | Admitting: Cardiology

## 2022-11-18 ENCOUNTER — Encounter (HOSPITAL_COMMUNITY): Payer: PPO | Admitting: Cardiology

## 2022-11-18 ENCOUNTER — Encounter (HOSPITAL_COMMUNITY): Payer: Self-pay | Admitting: Cardiology

## 2022-11-18 VITALS — BP 100/60 | HR 73 | Wt 181.6 lb

## 2022-11-18 DIAGNOSIS — I052 Rheumatic mitral stenosis with insufficiency: Secondary | ICD-10-CM | POA: Diagnosis not present

## 2022-11-18 DIAGNOSIS — R002 Palpitations: Secondary | ICD-10-CM | POA: Diagnosis not present

## 2022-11-18 DIAGNOSIS — E0520 Thyrotoxicosis with toxic multinodular goiter without thyrotoxic crisis or storm: Secondary | ICD-10-CM | POA: Insufficient documentation

## 2022-11-18 DIAGNOSIS — I272 Pulmonary hypertension, unspecified: Secondary | ICD-10-CM | POA: Insufficient documentation

## 2022-11-18 DIAGNOSIS — I5022 Chronic systolic (congestive) heart failure: Secondary | ICD-10-CM | POA: Insufficient documentation

## 2022-11-18 DIAGNOSIS — I130 Hypertensive heart and chronic kidney disease with heart failure and stage 1 through stage 4 chronic kidney disease, or unspecified chronic kidney disease: Secondary | ICD-10-CM | POA: Insufficient documentation

## 2022-11-18 DIAGNOSIS — Z9889 Other specified postprocedural states: Secondary | ICD-10-CM | POA: Diagnosis not present

## 2022-11-18 DIAGNOSIS — Z79899 Other long term (current) drug therapy: Secondary | ICD-10-CM | POA: Diagnosis not present

## 2022-11-18 DIAGNOSIS — I428 Other cardiomyopathies: Secondary | ICD-10-CM | POA: Insufficient documentation

## 2022-11-18 DIAGNOSIS — N184 Chronic kidney disease, stage 4 (severe): Secondary | ICD-10-CM | POA: Insufficient documentation

## 2022-11-18 DIAGNOSIS — Z7984 Long term (current) use of oral hypoglycemic drugs: Secondary | ICD-10-CM | POA: Diagnosis not present

## 2022-11-18 LAB — BASIC METABOLIC PANEL
Anion gap: 5 (ref 5–15)
BUN: 48 mg/dL — ABNORMAL HIGH (ref 8–23)
CO2: 27 mmol/L (ref 22–32)
Calcium: 9.4 mg/dL (ref 8.9–10.3)
Chloride: 109 mmol/L (ref 98–111)
Creatinine, Ser: 1.62 mg/dL — ABNORMAL HIGH (ref 0.44–1.00)
GFR, Estimated: 34 mL/min — ABNORMAL LOW (ref >60)
Glucose, Bld: 93 mg/dL (ref 70–99)
Potassium: 4.4 mmol/L (ref 3.5–5.1)
Sodium: 141 mmol/L (ref 135–145)

## 2022-11-18 LAB — BRAIN NATRIURETIC PEPTIDE: B Natriuretic Peptide: 742.5 pg/mL — ABNORMAL HIGH (ref 0.0–100.0)

## 2022-11-18 MED ORDER — TORSEMIDE 20 MG PO TABS: 20.0000 mg | ORAL_TABLET | Freq: Every day | ORAL | 3 refills | Status: DC

## 2022-11-18 NOTE — Patient Instructions (Signed)
There has been no changes to your medications.  Labs done today, your results will be available in MyChart, we will contact you for abnormal readings.  Your physician recommends that you schedule a follow-up appointment in: 2 months  If you have any questions or concerns before your next appointment please send [place] a message through [place] or call our office at [phone].    TO LEAVE A MESSAGE FOR THE NURSE SELECT OPTION 2, PLEASE LEAVE A MESSAGE INCLUDING: YOUR NAME DATE OF BIRTH CALL BACK NUMBER REASON FOR CALL**this is important as we prioritize the call backs  YOU WILL RECEIVE A CALL BACK THE SAME DAY AS LONG AS YOU CALL BEFORE 4:00 PM  At the [facility], you and your health needs are our priority. As part of our continuing mission to provide you with exceptional heart care, we have created designated Provider Care Teams. These Care Teams include your primary Cardiologist (physician) and Advanced Practice Providers (APPs- Physician Assistants and Nurse Practitioners) who all work together to provide you with the care you need, when you need it.   You may see any of the following providers on your designated Care Team at your next follow up: Dr Glori Bickers Dr [name] Dr. Roxana Hires, NP Lyda Jester, [place] [facility] [place], [place] Forestine Na, NP Audry Riles, PharmD   Please be sure to bring in all your medications bottles to every appointment.

## 2022-11-18 NOTE — Progress Notes (Signed)
ID:  Vanessa [name], DOB [birth year], MRN [id number]   Provider location: [facility] Type of Visit: Established patient   PCP:  Lennie Odor, PA  HF Cardiologist: Dr. Aundra Dubin   HPI: Vanessa [name] is a 71 y.o. with history of nonischemic cardiomyopathy and presents for followup of CHF. Patient has had a low EF recognized since at least 9/14, when echo showed EF 40-45%.  In Jun 27, 2023, her child died, which led to significant stress.  She developed worsening exertional dyspnea and subsequent echo showed a fall in EF to 25-30%.  She had left and right heart catheterization in 3/16 with no significant coronary disease detected and moderately elevated right and left heart filling pressures along with pulmonary venous hypertension.  Echo was done 12/16: EF 45%, diffuse hypokinesis. She had a repeat echo 11/17 => EF 45% with moderately dilated LV, moderate MR.  Echo 11/18 showed EF stable at 45% with mild LV dilation and mildly decreased RV systolic function, moderate MR.   Spironolactone was stopped due to elevated K. She apparently did not tolerate Veltassa well.   Echo was done in 11/19.  EF 30-35% with mild LV hypertrophy, mild-moderately decreased RV systolic function, possible severe MR with restricted posterior leaflet, PASP 50 mmHg, severe LAE.  TEE was done in 11/19 to assess mitral regurgitation.  This showed severe MR with restricted posterior leaflet.   In 1/20, she had Mitraclip with reduction in MR to 1+.    She had echo in 9/20 with EF 30-35%, severe LAE, normal RV, s/p Mitraclip with mild-moderate MR and mild MS.  The IVC was dilated, suggesting volume overload.   She was unable to tolerate Bidil due to lightheadedness.   Echo in 1/21 showed EF 40-45%, RV normal, biatrial dilation, mild mitral stenosis mean 5 mmHg, mild-moderate MR (s/p Mitraclip).  Echo in 1/22 showed EF 40-45% with moderate LV dilation, normal RV size and systolic function, severe LAE,  s/p Mitraclip with probably mild mitral stenosis (mean gradient 7, MVA 2.2 cm^2 by PHT), moderate MR.   Echo in 3/23 showed EF 40-45%, moderate LV dilation, normal RV, normal IVC, s/p MV repair with Mitraclip with mild mitral stenosis mean gradient 7 mmHg, moderate MR.   Patient had labs done in 8/23 showing K up to 6.1 with AKI/creatinine 2.88.  She was admitted and K was treated.  Entresto, spironolactone, Coreg, and Veltassa were stopped.  Torsemide was decreased to 10 mg daily.   Echo (9/23) showed EF stable 40-45%, mild MS (mean gradient 7 mmHg) and mild to moderate MR s/p Mitraclip.   Follow up 10/23, mildly volume up and torsemide increased to 40 mg daily x 3 days then back to 20 mg daily. She c/o of right upper chest pleuritic pain, no increased dyspnea. Arranged for CXR which was clear. D dimer was elevated at 6.68 and VQ scan ordered (avoid chest CT with CKD), however patient declined, saying she felt better.  Today she returns for HF follow up. Weight is stable.  She is short of breath walking up stairs or hills.  This is unchanged.  No dyspnea walking on flat ground.  She gets tired by the end of the day. No chest pain.  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    ECG (personally reviewed): NSR, inferior Qs, lateral TWIs  Labs (7/16): K 4.5, creatinine 1.12, BNP 596 Labs (8/16): K 4 => 5.1, creatinine 1.29 => 1.5, BNP 388 =>  426 Labs (9/16): K 4, creatinine 1.08, BNP 502 Labs (12/16): K 4.9, creatinine 1.57 Labs (5/17): K 5.2, creatinine 1.3 Labs (8/17): BNP 152 Labs (11/17): K 4.9, creatinine 1.32, BNP 236 Labs (8/18): K 4.6, creatinine 1.57 Labs (11/18): K 3.8, creatinine 1.29 Labs (2/19): K 5.1, creatinine 1.44 Labs (9/19): K 3.8, creatinine 1.08 Labs (11/19): K 4.3, creatinine 1.3 Labs (1/20): K 4.8, creatinine 1.68 Labs (3/20): K 4.4, creatinine 1.5 Labs (9/20): K 4.4, creatinine 1.77 => 1.48, LDL 109 Labs (10/20): K 4.6, creatinine 1.66 Labs (12/20): K 4.7,  creatinine 1.59 Labs (3/21): K 4.5, creatinine 1.73 Labs (6/21): K 4.6, creatinine 1.7 Labs (10/21): TSH normal, K 4.2, creatinine 1.37 Labs (3/22): K 4.7, creatinine 1.44 Labs (5/22): K 4.2, creatinine 1.75 Labs (9/22): K 4.5, creatinine 2.12 => 1.81 Labs (12/22): K 4, creatinine 1.31 Labs (3/23): K 4.2, creatinine 1.75, BNP 1276 Labs (6/23): TSH normal Labs (8/23): K 6.1 => 5.2, creatinine 1.95 => 2.88 => 2.21, BUN 120 => 111 Labs (10/23): K 4.6, creatinine 1.66 Labs (12/23): K 4.5, creatinine 2.43  PMH: 1. Cardiomyopathy: Nonischemic.  Echo (9/14) with EF 40-45%.  Echo (2/16) with EF 25-30%.  Echo (4/16) with EF 30-35%, moderate MR.  Echo (6/16) with EF 40%, moderate LV dilation, moderate diastolic dysfunction, moderate MR, moderate TR, PA systolic pressure 52 mmHg. RHC/LHC (3/16) with no significant CAD, EF 20-25%, 3+ MR; mean RA 13, PA 70/30 mean 45, mean PCWP 31 mmHg, CI 2.2, PVR 3.1 WU.  Echo (12/16) with EF 45%, diffuse hypokinesis, moderate MR, normal RV size and systolic function.  - Echo (11/17): EF 45%, moderately dilated LV, moderate eccentric posterior MR, PASP 42 mmHg, normal RV size and systolic function.  - Echo (11/18): EF 45%, mild LV dilation, moderate diastolic dysfunction, normal RV size with mildly decreased systolic function, PASP 50 mmHg, moderate MR.  - Echo (11/19): EF 30-35%, mild LV dilation, severe LAE, mild-moderately decreased RV systolic function, possible severe MR with restricted posterior leaflet, PASP 50 mmHg.  - Echo (1/20): EF 30-35%, severe LV dilation, s/p Mitraclip with mild MR and mild MS, PASP 48 mmHg.  - RHC (1/20, with Mitraclip): mean RA 10, mean LA 18 - Echo (2/20): EF 30-35%, s/p mitraclip with mild mitral stenosis, mean MV gradient 5 mmHg, moderate MR, PASP 56 mmHg.  - Echo (9/20): EF 30-35%, severe LAE, normal RV, s/p Mitraclip with mild-moderate MR and mild MS.  The IVC was dilated, suggesting volume overload.  - Echo (1/21): EF 40-45%, RV  normal, biatrial dilation, mild mitral stenosis mean 5 mmHg, mild-moderate MR (s/p Mitraclip).  - Echo (1/22): EF 40-45% with moderate LV dilation, normal RV size and systolic function, severe LAE, s/p Mitraclip with probably mild mitral stenosis (mean gradient 7, MVA 2.2 cm^2 by PHT), moderate MR. - Echo (3/23): EF 40-45%, moderate LV dilation, normal RV, normal IVC, s/p MV repair with Mitraclip with mild mitral stenosis mean gradient 7 mmHg, moderate MR.  - Echo (9/23): EF 40-45%, grade I DD, normal RV, s/p MC repair with Mitraclip with mild mitral stenosis mean gradient 7 mmHg, moderate MR. 2. PVCs: Holter (10/15) with 12% PVCs. 3. Pulmonary venous hypertension: CTA chest (7/16) negative for PE. PFTs (7/16) with minimal obstruction, mild restriction, moderately decreased DLCO.  4. Thyroid nodules: Biopsy benign.  5. CKD stage 3: Episodes of AKI/hyperkalemia in 8/23.  6. Mitral regurgitation: TEE (11/19) with EF 30-35%, moderate LV dilation, severe MR with posterior leaflet restriction and ERO 0.41 cm^2, no mitral  stenosis, mild to moderately decreased RV systolic function, peak RV-RA gradient 50 mmHg.  - Mitraclip placement x 2 in 1/20 with 1+ residual MR.  - Echo (2/20) with moderate residual MR, mild MS with mean gradient 5 mmHg.  - Echo (9/20) with mild-moderate MR, mild MS.  - Echo (1/21) with mild-moderate MR, mild MS s/p Mitraclip - Echo (1/22) with moderate residual MR, mild-moderate mitral stenosis.  - Echo (3/23) with moderate MR, mild MS s/p Mitraclip.  - Echo (9/23) with mild-moderate MR, mild MS mean gradient 7 mmHg 7. Hyperthyroism: Toxic multinodular goiter.   SH: Lives alone, only child passed away in 2023/06/23, nonsmoker, no ETOH or drugs.   FH: Father with MIs  ROS: All systems reviewed and negative except as per HPI.   Current Outpatient Medications  Medication Sig Dispense Refill   aspirin 81 MG EC tablet Take 1 tablet (81 mg total) by mouth daily.     Calcium  Carb-Cholecalciferol (CALCIUM + VITAMIN D3 PO) Take 1 tablet by mouth daily.     carvedilol (COREG) 6.25 MG tablet Take 1 tablet (6.25 mg total) by mouth 2 (two) times daily. 180 tablet 3   empagliflozin (JARDIANCE) 10 MG TABS tablet TAKE 1 TABLET BY MOUTH ONCE DAILY BEFORE BREAKFAST 90 tablet 3   ibandronate (BONIVA) 150 MG tablet Take 150 mg by mouth every 30 (thirty) days.     torsemide (DEMADEX) 20 MG tablet Take 1 tablet (20 mg total) by mouth daily. 90 tablet 3   No current facility-administered medications for this encounter.   BP 100/60   Pulse 73   Wt 82.4 kg (181 lb 9.6 oz)   SpO2 100%   BMI 32.17 kg/m    Wt Readings from Last 3 Encounters:  11/18/22 82.4 kg (181 lb 9.6 oz)  09/29/22 82 kg (180 lb 12.8 oz)  09/01/22 81.3 kg (179 lb 3.2 oz)   General: NAD Neck: JVP 8 cm, no thyromegaly or thyroid nodule.  Lungs: Clear to auscultation bilaterally with normal respiratory effort. CV: Nondisplaced PMI.  Heart regular S1/S2, no S3/S4, 3/6 HSM apex.  No peripheral edema.  No carotid bruit.  Normal pedal pulses.  Abdomen: Soft, nontender, no hepatosplenomegaly, no distention.  Skin: Intact without lesions or rashes.  Neurologic: Alert and oriented x 3.  Psych: Normal affect. Extremities: No clubbing or cyanosis.  HEENT: Normal.   Assessment/Plan: 1. Chronic systolic CHF: Nonischemic cardiomyopathy.  EF up to 45% on 12/16, repeat echo 11/18 shows that EF remains about 45%.  Etiology of NICM still unclear. Had frequent PVCs by past holter (12%) but doesn't explain her cardiomyopathy. Had improvement of palpitations on Coreg. Cannot rule out myocarditis. May also have component of Takotsubo with passing of child but EF has not improved as completely as would be expected with Takotsubo. Have previously discussed cardiac MRI to look for infiltrative disease but she says that she would be too claustrophobic.  Echo in 11/19 showed EF down to 30-35% and mild to moderate RV systolic  function.  There also appeared to be severe mitral regurgitation, which seemed progressive from the past. TEE in 11/19 confirmed severe MR with restricted posterior leaflet => Mitraclip 1/20 with decrease in MR to 1+.  Echo in 1/20 showed EF 30-35% with mild MR s/p Mitraclip. Echo in 9/20 with stable EF 30-35% but dilated IVC suggestive of volume overload.  Echo in 1/22 with EF 40-45%, normal RV.  Echo in 3/23 showed  EF 40-45%, moderate LV dilation, normal RV,  normal IVC, s/p MV repair with Mitraclip with mild mitral stenosis mean gradient 7 mmHg, moderate MR. Echo (9/23) showed EF stable 40-45%, mild MS (mean gradient 7 mmHg) and mild to moderate MR s/p Mitraclip.  NYHA class II, not markedly volume overloaded on exam.  GDMT has been limited by soft BP and CKD.  - Continue torsemide 20 mg daily. BMET/BNP today.  - Continue Coreg 6.25 mg bid. - Continue empagliflozin 10 mg daily.   No GU symptoms. - She will stay off spironolactone and Entresto for now with last creatinine up to 2.43.  Of note, she did not tolerate Bidil in the past.    - EF now above ICD range.   2. PVCs: Frequent (12%) on 10/15 holter.   Pt refused repeat holter monitor for further assessment. PVC count was probably not high enough to cause cardiomyopathy, unlikely primary etiology.  No palpitations currently.  - Continue Coreg. 3. Pulmonary hypertension: Primarily pulmonary venous HTN (PVR 3.1) from elevated left atrial pressure (previous RHC above).   - Best treatment remains adequate diuresis. 4. Mitral regurgitation:  TEE in 11/19 with severe MR, restricted posterior leaflet.  She had Mitraclip in 1/20. Post-op echo showed mild MR and mild mitral stenosis.  Echo in 1/22 showed moderate MR, mild-moderate MS. Echo in 3/23 showed moderate MR, mild-moderate MS. Echo 9/23 showed mild-moderate MR, mild MS (mean gradient 7 mmHg). - Will need antibiotics for dental prophylaxis.  5. 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   - Follow TSH.  6. 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  She is now off Entresto and spironolactone.  - BMET today.  - Will make sure she has nephrology referral.   Followup in 2 months with APP.   Signed, [name], MD  11/18/2022  [facility] [address] [facility] [place] [address] [phone] (office) [phone] (fax)

## 2022-12-01 ENCOUNTER — Other Ambulatory Visit: Payer: PPO

## 2022-12-01 ENCOUNTER — Other Ambulatory Visit (INDEPENDENT_AMBULATORY_CARE_PROVIDER_SITE_OTHER): Payer: PPO

## 2022-12-01 DIAGNOSIS — E0590 Thyrotoxicosis, unspecified without thyrotoxic crisis or storm: Secondary | ICD-10-CM | POA: Diagnosis not present

## 2022-12-01 LAB — TSH: TSH: 0.81 u[IU]/mL (ref 0.35–5.50)

## 2022-12-01 LAB — T4, FREE: Free T4: 1.25 ng/dL (ref 0.60–1.60)

## 2022-12-03 ENCOUNTER — Encounter: Payer: Self-pay | Admitting: Endocrinology

## 2022-12-03 ENCOUNTER — Ambulatory Visit: Payer: PPO | Admitting: Endocrinology

## 2022-12-03 VITALS — BP 110/80 | HR 77 | Ht 63.0 in | Wt 184.0 lb

## 2022-12-03 DIAGNOSIS — E042 Nontoxic multinodular goiter: Secondary | ICD-10-CM | POA: Diagnosis not present

## 2022-12-03 NOTE — Progress Notes (Signed)
Patient ID: Vanessa [name], female   DOB: [birth year], 72 y.o.   MRN: [id number]           Reason for Appointment: Goiter, hyperthyroid state    History of Present Illness:    The patient's thyroid enlargement was first discovered in 2016 by her PCP Ultrasound had shown multiple nodules and the dominant left-sided nodule was benign on biopsy  She has had no difficulty with swallowing and no pressure sensation in the neck  Follow-up ultrasound in 12/22 was stable  Her free T4 was initially found to be elevated in 08/2020 but not treated since apparently T3 was normal She did not have any symptoms at baseline such as weight loss, palpitations, shakiness or heat intolerance  Her TSH previously was in 2008 at 0.26 However her free T4 levels have been documented only since 2019 with highest level 1.55 concomitant with TSH of 0.22  Methimazole was started in 2/23 because of low TSH and free T4 of 1.55  METHIMAZOLE was stopped on her June 2023 visit but she did not come back for follow-up  Recently does not feel any palpitations or have any change in appetite Also no symptoms of choking, difficulty swallowing or pressure sensation in the neck  Lab Results  Component Value Date   FREET4 1.25 12/01/2022   FREET4 1.42 04/30/2022   FREET4 1.19 02/20/2022   TSH 0.81 12/01/2022   TSH 1.22 04/30/2022   TSH 0.67 02/20/2022     Scan: No hot nodule present with only likely a warm nodule on the right side and uptake 28.8%.  Allergies as of 12/03/2022   No Known Allergies      Medication List        Accurate as of December 03, 2022  9:32 AM. If you have any questions, ask your nurse or doctor.          aspirin EC 81 MG tablet Take 1 tablet (81 mg total) by mouth daily.   CALCIUM + VITAMIN D3 PO Take 1 tablet by mouth daily.   carvedilol 6.25 MG tablet Commonly known as: COREG Take 1 tablet (6.25 mg total) by mouth 2 (two) times daily.   ibandronate 150 MG tablet Commonly  known as: BONIVA Take 150 mg by mouth every 30 (thirty) days.   Jardiance 10 MG Tabs tablet Generic drug: empagliflozin TAKE 1 TABLET BY MOUTH ONCE DAILY BEFORE BREAKFAST   torsemide 20 MG tablet Commonly known as: DEMADEX Take 1 tablet (20 mg total) by mouth daily.        Allergies: No Known Allergies  Past Medical History:  Diagnosis Date   CHF (congestive heart failure) (HCC)    Dysrhythmia    PVC's   Hypertension    Moderate mitral regurgitation    moderate to severe MR with moderate pulmonary HTN   Nonischemic cardiomyopathy (HCC)    EF 30-35% by echo 2015   Pulmonary HTN ([place])    moderate with PASP 69mmHg by echo 2015   PVC's (premature ventricular contractions)     Past Surgical History:  Procedure Laterality Date   CARDIAC CATHETERIZATION  2003   normal coronary arteries   CARDIAC CATHETERIZATION  2013  Nov   h/o nonischemic DCM EF 35-40% increase LVEDP    HYSTEROSCOPY WITH D & C  12/02/2012   Procedure: DILATATION AND CURETTAGE /HYSTEROSCOPY;  Surgeon: Maeola Sarah. Landry Mellow, MD;  Location: [facility];  Service: Gynecology;  Laterality: N/A;  cervical polypectomy   LEFT AND RIGHT HEART  CATHETERIZATION WITH CORONARY ANGIOGRAM N/A 02/15/2015   Procedure: LEFT AND RIGHT HEART CATHETERIZATION WITH CORONARY ANGIOGRAM;  Surgeon: Jettie Booze, MD;  Location: [facility] CATH LAB;  Service: Cardiovascular;  Laterality: N/A;   MITRAL VALVE REPAIR N/A 12/14/2018   Procedure: MITRAL VALVE REPAIR;  Surgeon: Sherren Mocha, MD;  Location: [facility];  Service: Cardiovascular;  Laterality: N/A;   TEE WITHOUT CARDIOVERSION N/A 10/11/2018   Procedure: TRANSESOPHAGEAL ECHOCARDIOGRAM (TEE);  Surgeon: Larey Dresser, MD;  Location: [facility] ENDOSCOPY;  Service: Cardiovascular;  Laterality: N/A;    Family History  Problem Relation Age of Onset   Breast cancer Mother 59   Heart disease Father    Breast cancer Sister 59   Colon cancer Neg Hx    Esophageal cancer Neg Hx    Rectal cancer Neg  Hx    Stomach cancer Neg Hx    Thyroid disease Neg Hx     Social History:  reports that she has never smoked. She has never used smokeless tobacco. She reports that she does not drink alcohol and does not use drugs.   Review of Systems    Examination:   BP 110/80   Pulse 77   Ht 5\' 3"  (1.6 m)   Wt 184 lb (83.5 kg)   SpO2 99%   BMI 32.59 kg/m          Neck: The thyroid is enlarged significantly on the left side. Neck circumference is 38 cm over the mid thyroid Left thyroid lobe is 5-6 times normal size, slightly firm and smooth Right lobe is softer and about 3 times normal size, no nodules felt   Assessment/Plan:  MULTINODULAR goiter with likely only transient mild asymptomatic abnormal thyroid levels as of 12/22  Initially was diagnosed incidentally when tested by cardiologist Although her free T4 was slightly higher at the time of diagnosis her TSH was not suppressed confirming hyperthyroidism  Thyroid scan shows normal uptake and no hot nodule  Currently off methimazole and her thyroid levels are quite normal  Her exam today shows no change in her thyroid enlargement She agrees to be just seen annually for periodic follow-up and surgery will be indicated only if she has excessive local pressure symptoms   Elayne Snare 12/03/2022

## 2023-01-15 ENCOUNTER — Ambulatory Visit (HOSPITAL_COMMUNITY)
Admission: RE | Admit: 2023-01-15 | Discharge: 2023-01-15 | Disposition: A | Discharge status: Home / Self Care | Payer: PPO | Source: Ambulatory Visit | Attending: Family Medicine | Admitting: Family Medicine

## 2023-01-15 ENCOUNTER — Other Ambulatory Visit (HOSPITAL_COMMUNITY): Payer: Self-pay

## 2023-01-15 ENCOUNTER — Encounter (HOSPITAL_COMMUNITY): Payer: Self-pay

## 2023-01-15 ENCOUNTER — Telehealth (HOSPITAL_COMMUNITY): Payer: Self-pay

## 2023-01-15 VITALS — BP 110/70 | HR 80 | Wt 183.0 lb

## 2023-01-15 DIAGNOSIS — Z79899 Other long term (current) drug therapy: Secondary | ICD-10-CM | POA: Diagnosis not present

## 2023-01-15 DIAGNOSIS — I052 Rheumatic mitral stenosis with insufficiency: Secondary | ICD-10-CM | POA: Insufficient documentation

## 2023-01-15 DIAGNOSIS — Z7982 Long term (current) use of aspirin: Secondary | ICD-10-CM | POA: Diagnosis not present

## 2023-01-15 DIAGNOSIS — I428 Other cardiomyopathies: Secondary | ICD-10-CM | POA: Insufficient documentation

## 2023-01-15 DIAGNOSIS — R0602 Shortness of breath: Secondary | ICD-10-CM | POA: Insufficient documentation

## 2023-01-15 DIAGNOSIS — N183 Chronic kidney disease, stage 3 unspecified: Secondary | ICD-10-CM

## 2023-01-15 DIAGNOSIS — I493 Ventricular premature depolarization: Secondary | ICD-10-CM | POA: Diagnosis not present

## 2023-01-15 DIAGNOSIS — E041 Nontoxic single thyroid nodule: Secondary | ICD-10-CM

## 2023-01-15 DIAGNOSIS — E0520 Thyrotoxicosis with toxic multinodular goiter without thyrotoxic crisis or storm: Secondary | ICD-10-CM | POA: Insufficient documentation

## 2023-01-15 DIAGNOSIS — R002 Palpitations: Secondary | ICD-10-CM | POA: Insufficient documentation

## 2023-01-15 DIAGNOSIS — Z7984 Long term (current) use of oral hypoglycemic drugs: Secondary | ICD-10-CM | POA: Diagnosis not present

## 2023-01-15 DIAGNOSIS — I130 Hypertensive heart and chronic kidney disease with heart failure and stage 1 through stage 4 chronic kidney disease, or unspecified chronic kidney disease: Secondary | ICD-10-CM | POA: Insufficient documentation

## 2023-01-15 DIAGNOSIS — Z9889 Other specified postprocedural states: Secondary | ICD-10-CM

## 2023-01-15 DIAGNOSIS — I272 Pulmonary hypertension, unspecified: Secondary | ICD-10-CM | POA: Diagnosis not present

## 2023-01-15 DIAGNOSIS — I5022 Chronic systolic (congestive) heart failure: Secondary | ICD-10-CM | POA: Diagnosis not present

## 2023-01-15 LAB — BASIC METABOLIC PANEL
Anion gap: 11 (ref 5–15)
BUN: 49 mg/dL — ABNORMAL HIGH (ref 8–23)
CO2: 26 mmol/L (ref 22–32)
Calcium: 9.1 mg/dL (ref 8.9–10.3)
Chloride: 105 mmol/L (ref 98–111)
Creatinine, Ser: 1.54 mg/dL — ABNORMAL HIGH (ref 0.44–1.00)
GFR, Estimated: 36 mL/min — ABNORMAL LOW (ref >60)
Glucose, Bld: 116 mg/dL — ABNORMAL HIGH (ref 70–99)
Potassium: 4 mmol/L (ref 3.5–5.1)
Sodium: 142 mmol/L (ref 135–145)

## 2023-01-15 LAB — BRAIN NATRIURETIC PEPTIDE: B Natriuretic Peptide: 775.5 pg/mL — ABNORMAL HIGH (ref 0.0–100.0)

## 2023-01-15 NOTE — Progress Notes (Signed)
ID:  [name], DOB October 31, 1951, MRN [id number]   Provider location: [facility] Type of Visit: Established patient   PCP:  Vanessa Odor, PA  HF Cardiologist: Dr. Aundra [name]   HPI: Vanessa [name] is a 72 y.o. with history of nonischemic cardiomyopathy and presents for followup of CHF. Patient has had a low EF recognized since at least 9/14, when echo showed EF 40-45%.  In June 19, 2023, her child died, which led to significant stress.  She developed worsening exertional dyspnea and subsequent echo showed a fall in EF to 25-30%.  She had left and right heart catheterization in 3/16 with no significant coronary disease detected and moderately elevated right and left heart filling pressures along with pulmonary venous hypertension.  Echo was done 12/16: EF 45%, diffuse hypokinesis. She had a repeat echo 11/17 => EF 45% with moderately dilated LV, moderate MR.  Echo 11/18 showed EF stable at 45% with mild LV dilation and mildly decreased RV systolic function, moderate MR.   Spironolactone was stopped due to elevated K. She apparently did not tolerate Veltassa well.   Echo was done in 11/19.  EF 30-35% with mild LV hypertrophy, mild-moderately decreased RV systolic function, possible severe MR with restricted posterior leaflet, PASP 50 mmHg, severe LAE.  TEE was done in 11/19 to assess mitral regurgitation.  This showed severe MR with restricted posterior leaflet.   In 1/20, she had Mitraclip with reduction in MR to 1+.    She had echo in 9/20 with EF 30-35%, severe LAE, normal RV, s/p Mitraclip with mild-moderate MR and mild MS.  The IVC was dilated, suggesting volume overload.   She was unable to tolerate Bidil due to lightheadedness.   Echo in 1/21 showed EF 40-45%, RV normal, biatrial dilation, mild mitral stenosis mean 5 mmHg, mild-moderate MR (s/p Mitraclip).  Echo in 1/22 showed EF 40-45% with moderate LV dilation, normal RV size and systolic function, severe LAE,  s/p Mitraclip with probably mild mitral stenosis (mean gradient 7, MVA 2.2 cm^2 by PHT), moderate MR.   Echo in 3/23 showed EF 40-45%, moderate LV dilation, normal RV, normal IVC, s/p MV repair with Mitraclip with mild mitral stenosis mean gradient 7 mmHg, moderate MR.   Patient had labs done in 8/23 showing K up to 6.1 with AKI/creatinine 2.88.  She was admitted and K was treated.  Entresto, spironolactone, Coreg, and Veltassa were stopped.  Torsemide was decreased to 10 mg daily.   Echo (9/23) showed EF stable 40-45%, mild MS (mean gradient 7 mmHg) and mild to moderate MR s/p Mitraclip.   Follow up 10/23, mildly volume up and torsemide increased to 40 mg daily x 3 days then back to 20 mg daily. She c/o of right upper chest pleuritic pain, no increased dyspnea. Arranged for CXR which was clear. D dimer was elevated at 6.68 and VQ scan ordered (avoid chest CT with CKD), however patient declined, saying she felt better.  Today she returns for HF follow up. Overall feeling fine. She has SOB walking up stairs but otherwise no dyspnea with ADLs or walking on flat ground. 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 Has right ankle edema. Denies palpitations, CP, dizziness,  or PND/Orthopnea. Appetite ok. No fever or chills. Weight at home 182 pounds. Taking all medications. Occasionally takes extra torsemide for ankle edema.  ECG (personally reviewed): none ordered today.  Labs (2/19): K 5.1, creatinine 1.44 Labs (9/19):  K 3.8, creatinine 1.08 Labs (11/19): K 4.3, creatinine 1.3 Labs (1/20): K 4.8, creatinine 1.68 Labs (3/20): K 4.4, creatinine 1.5 Labs (9/20): K 4.4, creatinine 1.77 => 1.48, LDL 109 Labs (10/20): K 4.6, creatinine 1.66 Labs (12/20): K 4.7, creatinine 1.59 Labs (3/21): K 4.5, creatinine 1.73 Labs (6/21): K 4.6, creatinine 1.7 Labs (10/21): TSH normal, K 4.2, creatinine 1.37 Labs (3/22): K 4.7, creatinine 1.44 Labs (5/22): K 4.2, creatinine 1.75 Labs (9/22): K  4.5, creatinine 2.12 => 1.81 Labs (12/22): K 4, creatinine 1.31 Labs (3/23): K 4.2, creatinine 1.75, BNP 1276 Labs (6/23): TSH normal Labs (8/23): K 6.1 => 5.2, creatinine 1.95 => 2.88 => 2.21, BUN 120 => 111 Labs (10/23): K 4.6, creatinine 1.66 Labs (12/23): K 4.5, creatinine 2.43  PMH: 1. Cardiomyopathy: Nonischemic.  Echo (9/14) with EF 40-45%.  Echo (2/16) with EF 25-30%.  Echo (4/16) with EF 30-35%, moderate MR.  Echo (6/16) with EF 40%, moderate LV dilation, moderate diastolic dysfunction, moderate MR, moderate TR, PA systolic pressure 52 mmHg. RHC/LHC (3/16) with no significant CAD, EF 20-25%, 3+ MR; mean RA 13, PA 70/30 mean 45, mean PCWP 31 mmHg, CI 2.2, PVR 3.1 WU.  Echo (12/16) with EF 45%, diffuse hypokinesis, moderate MR, normal RV size and systolic function.  - Echo (11/17): EF 45%, moderately dilated LV, moderate eccentric posterior MR, PASP 42 mmHg, normal RV size and systolic function.  - Echo (11/18): EF 45%, mild LV dilation, moderate diastolic dysfunction, normal RV size with mildly decreased systolic function, PASP 50 mmHg, moderate MR.  - Echo (11/19): EF 30-35%, mild LV dilation, severe LAE, mild-moderately decreased RV systolic function, possible severe MR with restricted posterior leaflet, PASP 50 mmHg.  - Echo (1/20): EF 30-35%, severe LV dilation, s/p Mitraclip with mild MR and mild MS, PASP 48 mmHg.  - RHC (1/20, with Mitraclip): mean RA 10, mean LA 18 - Echo (2/20): EF 30-35%, s/p mitraclip with mild mitral stenosis, mean MV gradient 5 mmHg, moderate MR, PASP 56 mmHg.  - Echo (9/20): EF 30-35%, severe LAE, normal RV, s/p Mitraclip with mild-moderate MR and mild MS.  The IVC was dilated, suggesting volume overload.  - Echo (1/21): EF 40-45%, RV normal, biatrial dilation, mild mitral stenosis mean 5 mmHg, mild-moderate MR (s/p Mitraclip).  - Echo (1/22): EF 40-45% with moderate LV dilation, normal RV size and systolic function, severe LAE, s/p Mitraclip with probably mild  mitral stenosis (mean gradient 7, MVA 2.2 cm^2 by PHT), moderate MR. - Echo (3/23): EF 40-45%, moderate LV dilation, normal RV, normal IVC, s/p MV repair with Mitraclip with mild mitral stenosis mean gradient 7 mmHg, moderate MR.  - Echo (9/23): EF 40-45%, grade I DD, normal RV, s/p MC repair with Mitraclip with mild mitral stenosis mean gradient 7 mmHg, moderate MR. 2. PVCs: Holter (10/15) with 12% PVCs. 3. Pulmonary venous hypertension: CTA chest (7/16) negative for PE. PFTs (7/16) with minimal obstruction, mild restriction, moderately decreased DLCO.  4. Thyroid nodules: Biopsy benign.  5. CKD stage 3: Episodes of AKI/hyperkalemia in 8/23.  6. Mitral regurgitation: TEE (11/19) with EF 30-35%, moderate LV dilation, severe MR with posterior leaflet restriction and ERO 0.41 cm^2, no mitral stenosis, mild to moderately decreased RV systolic function, peak RV-RA gradient 50 mmHg.  - Mitraclip placement x 2 in 1/20 with 1+ residual MR.  - Echo (2/20) with moderate residual MR, mild MS with mean gradient 5 mmHg.  - Echo (9/20) with mild-moderate MR, mild MS.  - Echo (1/21)  with mild-moderate MR, mild MS s/p Mitraclip - Echo (1/22) with moderate residual MR, mild-moderate mitral stenosis.  - Echo (3/23) with moderate MR, mild MS s/p Mitraclip.  - Echo (9/23) with mild-moderate MR, mild MS mean gradient 7 mmHg 7. Hyperthyroism: Toxic multinodular goiter.   SH: Lives alone, only child passed away in 07/03/23, nonsmoker, no ETOH or drugs.   FH: Father with MIs  ROS: All systems reviewed and negative except as per HPI.   Current Outpatient Medications  Medication Sig Dispense Refill   aspirin 81 MG EC tablet Take 1 tablet (81 mg total) by mouth daily.     Calcium Carb-Cholecalciferol (CALCIUM + VITAMIN D3 PO) Take 1 tablet by mouth daily.     carvedilol (COREG) 6.25 MG tablet Take 1 tablet (6.25 mg total) by mouth 2 (two) times daily. 180 tablet 3   empagliflozin (JARDIANCE) 10 MG TABS tablet TAKE 1  TABLET BY MOUTH ONCE DAILY BEFORE BREAKFAST 90 tablet 3   ibandronate (BONIVA) 150 MG tablet Take 150 mg by mouth every 30 (thirty) days.     torsemide (DEMADEX) 20 MG tablet Take 1 tablet (20 mg total) by mouth daily. 90 tablet 3   No current facility-administered medications for this encounter.   BP 110/70   Pulse 80   Wt 83 kg (183 lb)   SpO2 98%   BMI 32.42 kg/m    Wt Readings from Last 3 Encounters:  01/15/23 83 kg (183 lb)  12/03/22 83.5 kg (184 lb)  11/18/22 82.4 kg (181 lb 9.6 oz)   Physical Exam General:  NAD. No resp difficulty, walked into clinic HEENT: Normal Neck: Supple. JVP 6-7. Carotids 2+ bilat; no bruits. No lymphadenopathy or thryomegaly appreciated. Cor: PMI nondisplaced. Regular rate & rhythm. No rubs, gallops, 3/6 HSM apex Lungs: Clear, diminished in bases Abdomen: Soft, nontender, nondistended. No hepatosplenomegaly. No bruits or masses. Good bowel sounds. Extremities: No cyanosis, clubbing, rash, R ankle edema Neuro: Alert & oriented x 3, cranial nerves grossly intact. Moves all 4 extremities w/o difficulty. Affect pleasant.  Assessment/Plan: 1. Chronic systolic CHF: Nonischemic cardiomyopathy.  EF up to 45% on 12/16, repeat echo 11/18 shows that EF remains about 45%.  Etiology of NICM still unclear. Had frequent PVCs by past holter (12%) but doesn't explain her cardiomyopathy. Had improvement of palpitations on Coreg. Cannot rule out myocarditis. May also have component of Takotsubo with passing of child but EF has not improved as completely as would be expected with Takotsubo. Have previously discussed cardiac MRI to look for infiltrative disease but she says that she would be too claustrophobic.  Echo in 11/19 showed EF down to 30-35% and mild to moderate RV systolic function.  There also appeared to be severe mitral regurgitation, which seemed progressive from the past. TEE in 11/19 confirmed severe MR with restricted posterior leaflet => Mitraclip 1/20 with  decrease in MR to 1+.  Echo in 1/20 showed EF 30-35% with mild MR s/p Mitraclip. Echo in 9/20 with stable EF 30-35% but dilated IVC suggestive of volume overload.  Echo in 1/22 with EF 40-45%, normal RV.  Echo in 3/23 showed  EF 40-45%, moderate LV dilation, normal RV, normal IVC, s/p MV repair with Mitraclip with mild mitral stenosis mean gradient 7 mmHg, moderate MR. Echo (9/23) showed EF stable 40-45%, mild MS (mean gradient 7 mmHg) and mild to moderate MR s/p Mitraclip.  NYHA class II, not markedly volume overloaded on exam.  GDMT has been limited by soft BP and  CKD.  - Continue torsemide 20 mg daily. BMET/BNP today.  - Continue Coreg 6.25 mg bid. - Continue empagliflozin 10 mg daily.   No GU symptoms. - She will stay off spironolactone and Entresto for now with last creatinine up to 2.43.  Of note, she did not tolerate Bidil in the past.    - EF now above ICD range.   2. PVCs: Frequent (12%) on 10/15 holter.   Pt refused repeat holter monitor for further assessment. PVC count was probably not high enough to cause cardiomyopathy, unlikely primary etiology.  No palpitations currently.  - Continue Coreg. 3. Pulmonary hypertension: Primarily pulmonary venous HTN (PVR 3.1) from elevated left atrial pressure (previous RHC above).   - Best treatment remains adequate diuresis. 4. Mitral regurgitation:  TEE in 11/19 with severe MR, restricted posterior leaflet.  She had Mitraclip in 1/20. Post-op echo showed mild MR and mild mitral stenosis.  Echo in 1/22 showed moderate MR, mild-moderate MS. Echo in 3/23 showed moderate MR, mild-moderate MS. Echo 9/23 showed mild-moderate MR, mild MS (mean gradient 7 mmHg). - Will need antibiotics for dental prophylaxis.  5. 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 She is followed by Endocrinology. - Follow TSH.  6. 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  She is now off Entresto and spironolactone.  - BMET today.   - Will make sure she has nephrology referral.   Follow up in 4 months with Dr. Aundra [name]  Signed, Rafael Bihari, FNP  01/15/2023  [place] [address] [facility] [place] [address] [phone] (office) [phone] (fax)

## 2023-01-15 NOTE — Patient Instructions (Addendum)
It was great to see you today! No medication changes are needed at this time.   Labs today We will only contact you if something comes back abnormal or we need to make some changes. Otherwise no news is good news!  Your physician wants you to follow-up in: 3-4 months with Dr Kendall Flack will receive a reminder letter in the mail two months in advance. If you don't receive a letter, please call our office to schedule the follow-up appointment.  Do the following things EVERYDAY: Weigh yourself in the morning before breakfast. Write it down and keep it in a log. Take your medicines as prescribed Eat low salt foods--Limit salt (sodium) to 2000 mg per day.  Stay as active as you can everyday Limit all fluids for the day to less than 2 liters   At the [facility], you and your health needs are our priority. As part of our continuing mission to provide you with exceptional heart care, we have created designated Provider Care Teams. These Care Teams include your primary Cardiologist (physician) and Advanced Practice Providers (APPs- Physician Assistants and Nurse Practitioners) who all work together to provide you with the care you need, when you need it.   You may see any of the following providers on your designated Care Team at your next follow up: Dr Glori Bickers Dr [name] Dr. Roxana Hires, NP Lyda Jester, [place] [facility] [place], [place] Forestine Na, NP Audry Riles, PharmD   Please be sure to bring in all your medications bottles to every appointment.    Thank you for choosing [facility]   If you have any questions or concerns before your next appointment please send [place] a message through [place] or call our office at [phone].    TO LEAVE A MESSAGE FOR THE NURSE SELECT OPTION 2, PLEASE LEAVE A MESSAGE INCLUDING: YOUR NAME DATE OF BIRTH CALL BACK NUMBER REASON FOR CALL**this is  important as we prioritize the call backs  YOU WILL RECEIVE A CALL BACK THE SAME DAY AS LONG AS YOU CALL BEFORE 4:00 PM

## 2023-01-15 NOTE — Telephone Encounter (Signed)
Patient aware of labs and verbalized understanding of med changes. Labs ordered and scheduled.

## 2023-01-27 ENCOUNTER — Ambulatory Visit (HOSPITAL_COMMUNITY)
Admission: RE | Admit: 2023-01-27 | Discharge: 2023-01-27 | Disposition: A | Discharge status: Home / Self Care | Payer: PPO | Source: Ambulatory Visit | Attending: Internal Medicine | Admitting: Internal Medicine

## 2023-01-27 DIAGNOSIS — I5022 Chronic systolic (congestive) heart failure: Secondary | ICD-10-CM | POA: Insufficient documentation

## 2023-01-27 LAB — BASIC METABOLIC PANEL
Anion gap: 13 (ref 5–15)
BUN: 55 mg/dL — ABNORMAL HIGH (ref 8–23)
CO2: 25 mmol/L (ref 22–32)
Calcium: 9 mg/dL (ref 8.9–10.3)
Chloride: 102 mmol/L (ref 98–111)
Creatinine, Ser: 1.7 mg/dL — ABNORMAL HIGH (ref 0.44–1.00)
GFR, Estimated: 32 mL/min — ABNORMAL LOW (ref >60)
Glucose, Bld: 106 mg/dL — ABNORMAL HIGH (ref 70–99)
Potassium: 4 mmol/L (ref 3.5–5.1)
Sodium: 140 mmol/L (ref 135–145)

## 2023-02-08 ENCOUNTER — Ambulatory Visit
Admission: RE | Admit: 2023-02-08 | Discharge: 2023-02-08 | Disposition: A | Discharge status: Home / Self Care | Payer: PPO | Source: Ambulatory Visit | Attending: Family Medicine | Admitting: Family Medicine

## 2023-02-08 DIAGNOSIS — N6320 Unspecified lump in the left breast, unspecified quadrant: Secondary | ICD-10-CM

## 2023-02-17 ENCOUNTER — Other Ambulatory Visit: Payer: Self-pay | Admitting: Family Medicine

## 2023-02-17 DIAGNOSIS — N6320 Unspecified lump in the left breast, unspecified quadrant: Secondary | ICD-10-CM

## 2023-03-17 ENCOUNTER — Other Ambulatory Visit (HOSPITAL_COMMUNITY): Payer: Self-pay | Admitting: Cardiology

## 2023-04-28 ENCOUNTER — Telehealth (HOSPITAL_COMMUNITY): Payer: Self-pay

## 2023-04-28 NOTE — Telephone Encounter (Signed)
Vanessa [name] left message that she never declined referral and that she never heard anything back about it. She said best time to reach her is in the morning.

## 2023-04-28 NOTE — Telephone Encounter (Signed)
[facility] called and stated patient asked them about Nephrology referral.  Looks like in February it was mentioned in the note to set up nephrology referral, I do not see any referral notes for this. Do we send to [facility] via fax or is it done in Epic?

## 2023-04-28 NOTE — Telephone Encounter (Signed)
Patient  has declined referral to [facility] in the past unsure if she is agreeable at this time.  Detailed message left for patient

## 2023-04-28 NOTE — Telephone Encounter (Signed)
Returned call to patient Pt reports at the time of original referral in February she was not agreeable reported her kidneys would heal up just fine   Referral placed via fax

## 2023-04-29 ENCOUNTER — Other Ambulatory Visit (HOSPITAL_COMMUNITY): Payer: Self-pay

## 2023-04-29 DIAGNOSIS — I5022 Chronic systolic (congestive) heart failure: Secondary | ICD-10-CM

## 2023-04-29 MED ORDER — TORSEMIDE 20 MG PO TABS: ORAL_TABLET | ORAL | 3 refills | Status: DC

## 2023-05-26 ENCOUNTER — Encounter (HOSPITAL_COMMUNITY): Payer: Self-pay | Admitting: Cardiology

## 2023-05-26 ENCOUNTER — Ambulatory Visit (HOSPITAL_COMMUNITY)
Admission: RE | Admit: 2023-05-26 | Discharge: 2023-05-26 | Disposition: A | Discharge status: Home / Self Care | Payer: PPO | Source: Ambulatory Visit | Attending: Cardiology | Admitting: Cardiology

## 2023-05-26 VITALS — BP 110/70 | HR 71 | Wt 180.8 lb

## 2023-05-26 DIAGNOSIS — I130 Hypertensive heart and chronic kidney disease with heart failure and stage 1 through stage 4 chronic kidney disease, or unspecified chronic kidney disease: Secondary | ICD-10-CM | POA: Diagnosis present

## 2023-05-26 DIAGNOSIS — N183 Chronic kidney disease, stage 3 unspecified: Secondary | ICD-10-CM | POA: Diagnosis not present

## 2023-05-26 DIAGNOSIS — R002 Palpitations: Secondary | ICD-10-CM | POA: Diagnosis not present

## 2023-05-26 DIAGNOSIS — Z7984 Long term (current) use of oral hypoglycemic drugs: Secondary | ICD-10-CM | POA: Diagnosis not present

## 2023-05-26 DIAGNOSIS — I052 Rheumatic mitral stenosis with insufficiency: Secondary | ICD-10-CM | POA: Diagnosis not present

## 2023-05-26 DIAGNOSIS — I428 Other cardiomyopathies: Secondary | ICD-10-CM | POA: Diagnosis not present

## 2023-05-26 DIAGNOSIS — E0520 Thyrotoxicosis with toxic multinodular goiter without thyrotoxic crisis or storm: Secondary | ICD-10-CM | POA: Diagnosis not present

## 2023-05-26 DIAGNOSIS — I272 Pulmonary hypertension, unspecified: Secondary | ICD-10-CM | POA: Diagnosis not present

## 2023-05-26 DIAGNOSIS — I493 Ventricular premature depolarization: Secondary | ICD-10-CM | POA: Insufficient documentation

## 2023-05-26 DIAGNOSIS — I5022 Chronic systolic (congestive) heart failure: Secondary | ICD-10-CM | POA: Diagnosis not present

## 2023-05-26 DIAGNOSIS — Z79899 Other long term (current) drug therapy: Secondary | ICD-10-CM | POA: Diagnosis not present

## 2023-05-26 LAB — TSH: TSH: 0.56 u[IU]/mL (ref 0.350–4.500)

## 2023-05-26 LAB — BASIC METABOLIC PANEL
Anion gap: 8 (ref 5–15)
BUN: 43 mg/dL — ABNORMAL HIGH (ref 8–23)
CO2: 28 mmol/L (ref 22–32)
Calcium: 8.7 mg/dL — ABNORMAL LOW (ref 8.9–10.3)
Chloride: 101 mmol/L (ref 98–111)
Creatinine, Ser: 1.52 mg/dL — ABNORMAL HIGH (ref 0.44–1.00)
GFR, Estimated: 36 mL/min — ABNORMAL LOW (ref >60)
Glucose, Bld: 93 mg/dL (ref 70–99)
Potassium: 4 mmol/L (ref 3.5–5.1)
Sodium: 137 mmol/L (ref 135–145)

## 2023-05-26 LAB — BRAIN NATRIURETIC PEPTIDE: B Natriuretic Peptide: 778 pg/mL — ABNORMAL HIGH (ref 0.0–100.0)

## 2023-05-26 MED ORDER — VERQUVO 2.5 MG PO TABS: 2.5000 mg | ORAL_TABLET | Freq: Every day | ORAL | 11 refills | Status: DC

## 2023-05-26 NOTE — Patient Instructions (Signed)
START Verquvo 2.5 mg daily.  Labs done today, your results will be available in MyChart, we will contact you for abnormal readings.  Your physician has requested that you have an echocardiogram. Echocardiography is a painless test that uses sound waves to create images of your heart. It provides your doctor with information about the size and shape of your heart and how well your heart's chambers and valves are working. This procedure takes approximately one hour. There are no restrictions for this procedure. Please do NOT wear cologne, perfume, aftershave, or lotions (deodorant is allowed). Please arrive 15 minutes prior to your appointment time.  Your physician recommends that you schedule a follow-up appointment in: 3 months with an echocardiogram   If you have any questions or concerns before your next appointment please send [place] a message through [place] or call our office at [phone].    TO LEAVE A MESSAGE FOR THE NURSE SELECT OPTION 2, PLEASE LEAVE A MESSAGE INCLUDING: YOUR NAME DATE OF BIRTH CALL BACK NUMBER REASON FOR CALL**this is important as we prioritize the call backs  YOU WILL RECEIVE A CALL BACK THE SAME DAY AS LONG AS YOU CALL BEFORE 4:00 PM  At the [facility], you and your health needs are our priority. As part of our continuing mission to provide you with exceptional heart care, we have created designated Provider Care Teams. These Care Teams include your primary Cardiologist (physician) and Advanced Practice Providers (APPs- Physician Assistants and Nurse Practitioners) who all work together to provide you with the care you need, when you need it.   You may see any of the following providers on your designated Care Team at your next follow up: Dr Arvilla Meres Dr Marca Ancona Dr. Marcos Eke, NP Robbie Lis, [place] [facility] [place], [place] Brynda Peon, NP Karle Plumber, PharmD   Please be sure to bring in all  your medications bottles to every appointment.    Thank you for choosing [facility]

## 2023-05-27 NOTE — Progress Notes (Signed)
ID:  Vanessa [name], DOB Mar 30, 1951, MRN [id number]   Provider location: [facility] Type of Visit: Established patient   PCP:  Milus Height, PA  HF Cardiologist: Dr. Shirlee Latch   HPI: Vanessa [name] is a 72 y.o. with history of nonischemic cardiomyopathy and presents for followup of CHF. Patient has had a low EF recognized since at least 9/14, when echo showed EF 40-45%.  In 21-Jun-2023, her child died, which led to significant stress.  She developed worsening exertional dyspnea and subsequent echo showed a fall in EF to 25-30%.  She had left and right heart catheterization in 3/16 with no significant coronary disease detected and moderately elevated right and left heart filling pressures along with pulmonary venous hypertension.  Echo was done 12/16: EF 45%, diffuse hypokinesis. She had a repeat echo 11/17 => EF 45% with moderately dilated LV, moderate MR.  Echo 11/18 showed EF stable at 45% with mild LV dilation and mildly decreased RV systolic function, moderate MR.   Spironolactone was stopped due to elevated K. She apparently did not tolerate Veltassa well.   Echo was done in 11/19.  EF 30-35% with mild LV hypertrophy, mild-moderately decreased RV systolic function, possible severe MR with restricted posterior leaflet, PASP 50 mmHg, severe LAE.  TEE was done in 11/19 to assess mitral regurgitation.  This showed severe MR with restricted posterior leaflet.   In 1/20, she had Mitraclip with reduction in MR to 1+.    She had echo in 9/20 with EF 30-35%, severe LAE, normal RV, s/p Mitraclip with mild-moderate MR and mild MS.  The IVC was dilated, suggesting volume overload.   She was unable to tolerate Bidil due to lightheadedness.   Echo in 1/21 showed EF 40-45%, RV normal, biatrial dilation, mild mitral stenosis mean 5 mmHg, mild-moderate MR (s/p Mitraclip).  Echo in 1/22 showed EF 40-45% with moderate LV dilation, normal RV size and systolic function, severe LAE,  s/p Mitraclip with probably mild mitral stenosis (mean gradient 7, MVA 2.2 cm^2 by PHT), moderate MR.   Echo in 3/23 showed EF 40-45%, moderate LV dilation, normal RV, normal IVC, s/p MV repair with Mitraclip with mild mitral stenosis mean gradient 7 mmHg, moderate MR.   Patient had labs done in 8/23 showing K up to 6.1 with AKI/creatinine 2.88.  She was admitted and K was treated.  Entresto, spironolactone, Coreg, and Veltassa were stopped.  Torsemide was decreased to 10 mg daily.   Echo (9/23) showed EF stable 40-45%, mild MS (mean gradient 7 mmHg) and mild to moderate MR s/p Mitraclip.   Today she returns for HF follow up. Weight is down 3 lbs.  She is short of breath walking a 'long way' or up stairs.  This is unchanged.  No orthopnea/PND.  No chest pain.  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  No lightheadedness.   ECG (personally reviewed): NSR, LVH, nonspecific T wave changes.   Labs (7/16): K 4.5, creatinine 1.12, BNP 596 Labs (8/16): K 4 => 5.1, creatinine 1.29 => 1.5, BNP 388 => 426 Labs (9/16): K 4, creatinine 1.08, BNP 502 Labs (12/16): K 4.9, creatinine 1.57 Labs (5/17): K 5.2, creatinine 1.3 Labs (8/17): BNP 152 Labs (11/17): K 4.9, creatinine 1.32, BNP 236 Labs (8/18): K 4.6, creatinine 1.57 Labs (11/18): K 3.8, creatinine 1.29 Labs (2/19): K 5.1, creatinine 1.44 Labs (9/19): K 3.8, creatinine 1.08 Labs (11/19): K 4.3, creatinine 1.3 Labs (1/20): K 4.8,  creatinine 1.68 Labs (3/20): K 4.4, creatinine 1.5 Labs (9/20): K 4.4, creatinine 1.77 => 1.48, LDL 109 Labs (10/20): K 4.6, creatinine 1.66 Labs (12/20): K 4.7, creatinine 1.59 Labs (3/21): K 4.5, creatinine 1.73 Labs (6/21): K 4.6, creatinine 1.7 Labs (10/21): TSH normal, K 4.2, creatinine 1.37 Labs (3/22): K 4.7, creatinine 1.44 Labs (5/22): K 4.2, creatinine 1.75 Labs (9/22): K 4.5, creatinine 2.12 => 1.81 Labs (12/22): K 4, creatinine 1.31 Labs (3/23): K 4.2, creatinine 1.75, BNP 1276 Labs (6/23): TSH  normal Labs (8/23): K 6.1 => 5.2, creatinine 1.95 => 2.88 => 2.21, BUN 120 => 111 Labs (10/23): K 4.6, creatinine 1.66 Labs (12/23): K 4.5, creatinine 2.43 Labs (2/24): K 4, creatinine 1.7, BNP 775  PMH: 1. Cardiomyopathy: Nonischemic.  Echo (9/14) with EF 40-45%.  Echo (2/16) with EF 25-30%.  Echo (4/16) with EF 30-35%, moderate MR.  Echo (6/16) with EF 40%, moderate LV dilation, moderate diastolic dysfunction, moderate MR, moderate TR, PA systolic pressure 52 mmHg. RHC/LHC (3/16) with no significant CAD, EF 20-25%, 3+ MR; mean RA 13, PA 70/30 mean 45, mean PCWP 31 mmHg, CI 2.2, PVR 3.1 WU.  Echo (12/16) with EF 45%, diffuse hypokinesis, moderate MR, normal RV size and systolic function.  - Echo (11/17): EF 45%, moderately dilated LV, moderate eccentric posterior MR, PASP 42 mmHg, normal RV size and systolic function.  - Echo (11/18): EF 45%, mild LV dilation, moderate diastolic dysfunction, normal RV size with mildly decreased systolic function, PASP 50 mmHg, moderate MR.  - Echo (11/19): EF 30-35%, mild LV dilation, severe LAE, mild-moderately decreased RV systolic function, possible severe MR with restricted posterior leaflet, PASP 50 mmHg.  - Echo (1/20): EF 30-35%, severe LV dilation, s/p Mitraclip with mild MR and mild MS, PASP 48 mmHg.  - RHC (1/20, with Mitraclip): mean RA 10, mean LA 18 - Echo (2/20): EF 30-35%, s/p mitraclip with mild mitral stenosis, mean MV gradient 5 mmHg, moderate MR, PASP 56 mmHg.  - Echo (9/20): EF 30-35%, severe LAE, normal RV, s/p Mitraclip with mild-moderate MR and mild MS.  The IVC was dilated, suggesting volume overload.  - Echo (1/21): EF 40-45%, RV normal, biatrial dilation, mild mitral stenosis mean 5 mmHg, mild-moderate MR (s/p Mitraclip).  - Echo (1/22): EF 40-45% with moderate LV dilation, normal RV size and systolic function, severe LAE, s/p Mitraclip with probably mild mitral stenosis (mean gradient 7, MVA 2.2 cm^2 by PHT), moderate MR. - Echo (3/23):  EF 40-45%, moderate LV dilation, normal RV, normal IVC, s/p MV repair with Mitraclip with mild mitral stenosis mean gradient 7 mmHg, moderate MR.  - Echo (9/23): EF 40-45%, grade I DD, normal RV, s/p MC repair with Mitraclip with mild mitral stenosis mean gradient 7 mmHg, moderate MR. 2. PVCs: Holter (10/15) with 12% PVCs. 3. Pulmonary venous hypertension: CTA chest (7/16) negative for PE. PFTs (7/16) with minimal obstruction, mild restriction, moderately decreased DLCO.  4. Thyroid nodules: Biopsy benign.  5. CKD stage 3: Episodes of AKI/hyperkalemia in 8/23.  6. Mitral regurgitation: TEE (11/19) with EF 30-35%, moderate LV dilation, severe MR with posterior leaflet restriction and ERO 0.41 cm^2, no mitral stenosis, mild to moderately decreased RV systolic function, peak RV-RA gradient 50 mmHg.  - Mitraclip placement x 2 in 1/20 with 1+ residual MR.  - Echo (2/20) with moderate residual MR, mild MS with mean gradient 5 mmHg.  - Echo (9/20) with mild-moderate MR, mild MS.  - Echo (1/21) with mild-moderate MR, mild MS s/p  Mitraclip - Echo (1/22) with moderate residual MR, mild-moderate mitral stenosis.  - Echo (3/23) with moderate MR, mild MS s/p Mitraclip.  - Echo (9/23) with mild-moderate MR, mild MS mean gradient 7 mmHg 7. Hyperthyroism: Toxic multinodular goiter.   SH: Lives alone, only child passed away in July 12, 2023, nonsmoker, no ETOH or drugs.   FH: Father with MIs  ROS: All systems reviewed and negative except as per HPI.   Current Outpatient Medications  Medication Sig Dispense Refill   aspirin 81 MG EC tablet Take 1 tablet (81 mg total) by mouth daily.     Calcium Carb-Cholecalciferol (CALCIUM + VITAMIN D3 PO) Take 1 tablet by mouth daily.     carvedilol (COREG) 6.25 MG tablet Take 1 tablet (6.25 mg total) by mouth 2 (two) times daily. 180 tablet 3   empagliflozin (JARDIANCE) 10 MG TABS tablet TAKE 1 TABLET BY MOUTH ONCE DAILY BEFORE BREAKFAST 90 tablet 3   ibandronate (BONIVA) 150 MG  tablet Take 150 mg by mouth every 30 (thirty) days.     torsemide (DEMADEX) 20 MG tablet 40mg  alternating with 20 mg. (40mg  one day, 20mg  the next day) 90 tablet 3   Vericiguat (VERQUVO) 2.5 MG TABS Take 1 tablet (2.5 mg total) by mouth daily. 30 tablet 11   No current facility-administered medications for this encounter.   BP 110/70   Pulse 71   Wt 82 kg (180 lb 12.8 oz)   SpO2 98%   BMI 32.03 kg/m    Wt Readings from Last 3 Encounters:  05/26/23 82 kg (180 lb 12.8 oz)  01/15/23 83 kg (183 lb)  12/03/22 83.5 kg (184 lb)   General: NAD Neck: JVP 8. Multinodular goiter.  Lungs: Clear to auscultation bilaterally with normal respiratory effort. CV: Nondisplaced PMI.  Heart regular S1/S2, no S3/S4, 2/6 HSM apex.  No peripheral edema.  No carotid bruit.  Normal pedal pulses.  Abdomen: Soft, nontender, no hepatosplenomegaly, no distention.  Skin: Intact without lesions or rashes.  Neurologic: Alert and oriented x 3.  Psych: Normal affect. Extremities: No clubbing or cyanosis.  HEENT: Normal.   Assessment/Plan: 1. Chronic systolic CHF: Nonischemic cardiomyopathy.  EF up to 45% on 12/16, repeat echo 11/18 shows that EF remains about 45%.  Etiology of NICM still unclear. Had frequent PVCs by past holter (12%) but doesn't explain her cardiomyopathy. Had improvement of palpitations on Coreg. Cannot rule out myocarditis. May also have component of Takotsubo with passing of child but EF has not improved as completely as would be expected with Takotsubo. Have previously discussed cardiac MRI to look for infiltrative disease but she says that she would be too claustrophobic.  Echo in 11/19 showed EF down to 30-35% and mild to moderate RV systolic function.  There also appeared to be severe mitral regurgitation, which seemed progressive from the past. TEE in 11/19 confirmed severe MR with restricted posterior leaflet => Mitraclip 1/20 with decrease in MR to 1+.  Echo in 1/20 showed EF 30-35% with mild  MR s/p Mitraclip. Echo in 9/20 with stable EF 30-35% but dilated IVC suggestive of volume overload.  Echo in 1/22 with EF 40-45%, normal RV.  Echo in 3/23 showed  EF 40-45%, moderate LV dilation, normal RV, normal IVC, s/p MV repair with Mitraclip with mild mitral stenosis mean gradient 7 mmHg, moderate MR. Echo (9/23) showed EF stable 40-45%, mild MS (mean gradient 7 mmHg) and mild to moderate MR s/p Mitraclip.  NYHA class II, suspect no more than minimal  volume overload on exam.  GDMT has been limited by soft BP and CKD.  - Continue torsemide 20 mg daily alternating with 40 mg daily. She will cut back on sodium intake.  BMET/BNP today.  - Continue Coreg 6.25 mg bid. - Continue empagliflozin 10 mg daily.    - She will stay off spironolactone and Entresto for now with elevated creatinine and history of hyperkalemia.  Of note, she did not tolerate Bidil in the past.    - Start Verquvo 2.5 mg daily.  - EF now above ICD range.   - Echo at followup in 3 months.  2. PVCs: Frequent (12%) on 10/15 holter.   Pt refused repeat holter monitor for further assessment. PVC count was probably not high enough to cause cardiomyopathy, unlikely primary etiology.  No palpitations currently.  - Continue Coreg. 3. Pulmonary hypertension: Primarily pulmonary venous HTN (PVR 3.1) from elevated left atrial pressure (previous RHC above).   - Best treatment remains adequate diuresis. 4. Mitral regurgitation:  TEE in 11/19 with severe MR, restricted posterior leaflet.  She had Mitraclip in 1/20. Post-op echo showed mild MR and mild mitral stenosis.  Echo in 1/22 showed moderate MR, mild-moderate MS. Echo in 3/23 showed moderate MR, mild-moderate MS. Echo 9/23 showed mild-moderate MR, mild MS (mean gradient 7 mmHg). - Will need antibiotics for dental prophylaxis.  5. 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  - Check TSH today.  6. 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  - BMET today.  - nephrology referral.   Followup in 3 months with echo.   Signed, Marca Ancona, MD  05/27/2023  [facility] [address] [facility] [place] [address] [phone] (office) [phone] (fax)

## 2023-05-28 ENCOUNTER — Telehealth (HOSPITAL_COMMUNITY): Payer: Self-pay | Admitting: Pharmacy Technician

## 2023-05-28 ENCOUNTER — Other Ambulatory Visit (HOSPITAL_COMMUNITY): Payer: Self-pay

## 2023-05-28 NOTE — Telephone Encounter (Signed)
[facility] Patient Advocate Encounter  Prior Authorization for Aileen Pilot has been approved.    PA# [id number] Effective dates: 05/28/23 through 05/27/24  Patients co-pay is $11.20 (patient has LIS, should be receiving 90 day supplies to get maximum benefit with co-pays).  Archer Asa, CPhT

## 2023-05-28 NOTE — Telephone Encounter (Signed)
Patient Advocate Encounter   Received notification from [employer] that prior authorization for Vanessa [name] is required.   PA submitted on CoverMyMeds Key [id number] Status is pending   Will continue to follow.

## 2023-07-13 ENCOUNTER — Other Ambulatory Visit: Payer: Self-pay | Admitting: Nephrology

## 2023-07-13 DIAGNOSIS — N1832 Chronic kidney disease, stage 3b: Secondary | ICD-10-CM

## 2023-07-13 DIAGNOSIS — N2581 Secondary hyperparathyroidism of renal origin: Secondary | ICD-10-CM

## 2023-07-15 ENCOUNTER — Ambulatory Visit
Admission: RE | Admit: 2023-07-15 | Discharge: 2023-07-15 | Disposition: A | Discharge status: Home / Self Care | Payer: PPO | Source: Ambulatory Visit | Attending: Nephrology | Admitting: Nephrology

## 2023-07-15 DIAGNOSIS — N1832 Chronic kidney disease, stage 3b: Secondary | ICD-10-CM

## 2023-07-15 DIAGNOSIS — N2581 Secondary hyperparathyroidism of renal origin: Secondary | ICD-10-CM

## 2023-08-08 ENCOUNTER — Other Ambulatory Visit (HOSPITAL_COMMUNITY): Payer: Self-pay | Admitting: Family Medicine

## 2023-08-12 ENCOUNTER — Ambulatory Visit: Payer: PPO

## 2023-08-12 ENCOUNTER — Ambulatory Visit
Admission: RE | Admit: 2023-08-12 | Discharge: 2023-08-12 | Disposition: A | Discharge status: Home / Self Care | Payer: PPO | Source: Ambulatory Visit | Attending: Family Medicine | Admitting: Family Medicine

## 2023-08-12 DIAGNOSIS — N6320 Unspecified lump in the left breast, unspecified quadrant: Secondary | ICD-10-CM

## 2023-08-23 ENCOUNTER — Other Ambulatory Visit (HOSPITAL_COMMUNITY): Payer: Self-pay | Admitting: Family Medicine

## 2023-08-23 DIAGNOSIS — I5022 Chronic systolic (congestive) heart failure: Secondary | ICD-10-CM

## 2023-08-26 ENCOUNTER — Ambulatory Visit (HOSPITAL_COMMUNITY)
Admission: RE | Admit: 2023-08-26 | Discharge: 2023-08-26 | Disposition: A | Discharge status: Home / Self Care | Payer: PPO | Source: Ambulatory Visit | Attending: Cardiology | Admitting: Cardiology

## 2023-08-26 ENCOUNTER — Encounter (HOSPITAL_COMMUNITY): Payer: Self-pay | Admitting: Cardiology

## 2023-08-26 ENCOUNTER — Other Ambulatory Visit (HOSPITAL_COMMUNITY): Payer: Self-pay

## 2023-08-26 ENCOUNTER — Telehealth (HOSPITAL_COMMUNITY): Payer: Self-pay

## 2023-08-26 VITALS — BP 110/70 | HR 64 | Wt 176.6 lb

## 2023-08-26 DIAGNOSIS — I340 Nonrheumatic mitral (valve) insufficiency: Secondary | ICD-10-CM

## 2023-08-26 DIAGNOSIS — I130 Hypertensive heart and chronic kidney disease with heart failure and stage 1 through stage 4 chronic kidney disease, or unspecified chronic kidney disease: Secondary | ICD-10-CM | POA: Insufficient documentation

## 2023-08-26 DIAGNOSIS — Z952 Presence of prosthetic heart valve: Secondary | ICD-10-CM | POA: Diagnosis not present

## 2023-08-26 DIAGNOSIS — I428 Other cardiomyopathies: Secondary | ICD-10-CM | POA: Insufficient documentation

## 2023-08-26 DIAGNOSIS — I272 Pulmonary hypertension, unspecified: Secondary | ICD-10-CM | POA: Diagnosis not present

## 2023-08-26 DIAGNOSIS — I5022 Chronic systolic (congestive) heart failure: Secondary | ICD-10-CM

## 2023-08-26 DIAGNOSIS — N183 Chronic kidney disease, stage 3 unspecified: Secondary | ICD-10-CM | POA: Insufficient documentation

## 2023-08-26 DIAGNOSIS — E0520 Thyrotoxicosis with toxic multinodular goiter without thyrotoxic crisis or storm: Secondary | ICD-10-CM | POA: Insufficient documentation

## 2023-08-26 DIAGNOSIS — I493 Ventricular premature depolarization: Secondary | ICD-10-CM | POA: Insufficient documentation

## 2023-08-26 DIAGNOSIS — R9431 Abnormal electrocardiogram [ECG] [EKG]: Secondary | ICD-10-CM | POA: Diagnosis not present

## 2023-08-26 LAB — ECHOCARDIOGRAM COMPLETE
AR max vel: 1.32 cm2
AV Area VTI: 1.39 cm2
AV Area mean vel: 1.36 cm2
AV Mean grad: 6.5 mmHg
AV Peak grad: 11.4 mmHg
Ao pk vel: 1.69 m/s
Area-P 1/2: 2.77 cm2
Calc EF: 42.5 %
MV M vel: 5.99 m/s
MV Peak grad: 143.5 mmHg
MV VTI: 0.95 cm2
Radius: 0.6 cm
S' Lateral: 4.6 cm
Single Plane A2C EF: 40.8 %
Single Plane A4C EF: 44.5 %

## 2023-08-26 LAB — BASIC METABOLIC PANEL
Anion gap: 11 (ref 5–15)
BUN: 42 mg/dL — ABNORMAL HIGH (ref 8–23)
CO2: 28 mmol/L (ref 22–32)
Calcium: 9.8 mg/dL (ref 8.9–10.3)
Chloride: 99 mmol/L (ref 98–111)
Creatinine, Ser: 1.66 mg/dL — ABNORMAL HIGH (ref 0.44–1.00)
GFR, Estimated: 33 mL/min — ABNORMAL LOW (ref >60)
Glucose, Bld: 109 mg/dL — ABNORMAL HIGH (ref 70–99)
Potassium: 4.2 mmol/L (ref 3.5–5.1)
Sodium: 138 mmol/L (ref 135–145)

## 2023-08-26 LAB — BRAIN NATRIURETIC PEPTIDE: B Natriuretic Peptide: 781 pg/mL — ABNORMAL HIGH (ref 0.0–100.0)

## 2023-08-26 NOTE — Progress Notes (Signed)
Orders placed for TEE per Dr. Shirlee Latch scheduled for 10/8

## 2023-08-26 NOTE — Telephone Encounter (Signed)
Per Dr. Shirlee Latch  Needs to be set up for TEE with me to look at MR. Tell her that I spoke with the Mitraclip Dr and she may be a candidate for a redo clip.   Called and spoke with patient, patient agreeable. Scheduled patient for 09/07/23 for TEE with Dr. Shirlee Latch at 8:00am went over instructions with patient as listed below- patient is aware and verbalized understanding of all instructions.     Dear Vanessa [name]  You are scheduled for a TEE (Transesophageal Echocardiogram) on Tuesday, October 8 with Dr. Shirlee Latch.  Please arrive at the [facility] (Main Entrance A) at [facility]: [address] [place], [place] [address] at 7:00 AM (This time is 1 hour(s) before your procedure to ensure your preparation). Free valet parking service is available. You will check in at ADMITTING. The support person will be asked to wait in the waiting room.  It is OK to have someone drop you off and come back when you are ready to be discharged.     DIET:  Nothing to eat or drink after midnight except a sip of water with medications (see medication instructions below)  MEDICATION INSTRUCTIONS: !!IF ANY NEW MEDICATIONS ARE STARTED AFTER TODAY, PLEASE NOTIFY YOUR PROVIDER AS SOON AS POSSIBLE!!  FYI: Medications such as Semaglutide (Ozempic, [place]), Tirzepatide (Mounjaro, Zepbound), Dulaglutide (Trulicity), etc ("GLP1 agonists") AND Canagliflozin (Invokana), Dapagliflozin (Farxiga), Empagliflozin (Jardiance), Ertugliflozin (Steglatro), Bexagliflozin [employer]) or any combination with one of these drugs such as Invokamet (Canagliflozin/Metformin), Synjardy (Empagliflozin/Metformin), etc ("SGLT2 inhibitors") must be held around the time of a procedure. This is not a comprehensive list of all of these drugs. Please review all of your medications and talk to your provider if you take any one of these. If you are not sure, ask your provider.   HOLD: Empagliflozin (Jardiance) for 3 days prior to the procedure. Last dose  on Saturday, October 08.    FYI:  For your safety, and to allow [place] to monitor your vital signs accurately during the surgery/procedure we request: If you have artificial nails, gel coating, SNS etc, please have those removed prior to your surgery/procedure. Not having the nail coverings /polish removed may result in cancellation or delay of your surgery/procedure.  You must have a responsible person to drive you home and stay in the waiting area during your procedure. Failure to do so could result in cancellation.  Bring your insurance cards.  *Special Note: Every effort is made to have your procedure done on time. Occasionally there are emergencies that occur at the hospital that may cause delays. Please be patient if a delay does occur.

## 2023-08-26 NOTE — H&P (View-Only) (Signed)
ID:  Vanessa [name], DOB October 03, 1951, MRN [id number]   Provider location: [facility] Type of Visit: Established patient   PCP:  Milus Height, PA  HF Cardiologist: Dr. Shirlee Latch   HPI: Vanessa [name] is a 72 y.o. with history of nonischemic cardiomyopathy and presents for followup of CHF. Patient has had a low EF recognized since at least 9/14, when echo showed EF 40-45%.  In 07-03-23, her child died, which led to significant stress.  She developed worsening exertional dyspnea and subsequent echo showed a fall in EF to 25-30%.  She had left and right heart catheterization in 3/16 with no significant coronary disease detected and moderately elevated right and left heart filling pressures along with pulmonary venous hypertension.  Echo was done 12/16: EF 45%, diffuse hypokinesis. She had a repeat echo 11/17 => EF 45% with moderately dilated LV, moderate MR.  Echo 11/18 showed EF stable at 45% with mild LV dilation and mildly decreased RV systolic function, moderate MR.   Spironolactone was stopped due to elevated K. She apparently did not tolerate Veltassa well.   Echo was done in 11/19.  EF 30-35% with mild LV hypertrophy, mild-moderately decreased RV systolic function, possible severe MR with restricted posterior leaflet, PASP 50 mmHg, severe LAE.  TEE was done in 11/19 to assess mitral regurgitation.  This showed severe MR with restricted posterior leaflet.   In 1/20, she had Mitraclip with reduction in MR to 1+.    She had echo in 9/20 with EF 30-35%, severe LAE, normal RV, s/p Mitraclip with mild-moderate MR and mild MS.  The IVC was dilated, suggesting volume overload.   She was unable to tolerate Bidil due to lightheadedness.   Echo in 1/21 showed EF 40-45%, RV normal, biatrial dilation, mild mitral stenosis mean 5 mmHg, mild-moderate MR (s/p Mitraclip).  Echo in 1/22 showed EF 40-45% with moderate LV dilation, normal RV size and systolic function, severe LAE,  s/p Mitraclip with probably mild mitral stenosis (mean gradient 7, MVA 2.2 cm^2 by PHT), moderate MR.   Echo in 3/23 showed EF 40-45%, moderate LV dilation, normal RV, normal IVC, s/p MV repair with Mitraclip with mild mitral stenosis mean gradient 7 mmHg, moderate MR.   Patient had labs done in 8/23 showing K up to 6.1 with AKI/creatinine 2.88.  She was admitted and K was treated.  Entresto, spironolactone, Coreg, and Veltassa were stopped.  Torsemide was decreased to 10 mg daily.   Echo (9/23) showed EF stable 40-45%, mild MS (mean gradient 7 mmHg) and mild to moderate MR s/p Mitraclip.   Echo was done today and reviewed, EF 40-45%, mild LV dilation and mild LVH, mildly decreased RV systolic function, PASP 47 mmHg, s/p Mitraclip with mean gradient 6 mmHg, suspect severe mitral regurgitation, IVC normal, small secundum ASD.   Today she returns for HF follow up. Weight is down 4 lbs.  She has had episodes of transient left arm "squeezing" since starting Verquvo.  Episodes last a few minutes and are not exertional.  She still works part time at a daycare center but overall is not very active.  She is short of breath walking up stairs, no problems on flat ground.  No chest pain. No orthopnea/PND.  No lightheadedness.   ECG (personally reviewed): NSR, PVC, LVH with repolarization abnormality  Labs (7/16): K 4.5, creatinine 1.12, BNP 596 Labs (8/16): K 4 => 5.1, creatinine 1.29 => 1.5, BNP 388 => 426 Labs (  9/16): K 4, creatinine 1.08, BNP 502 Labs (12/16): K 4.9, creatinine 1.57 Labs (5/17): K 5.2, creatinine 1.3 Labs (8/17): BNP 152 Labs (11/17): K 4.9, creatinine 1.32, BNP 236 Labs (8/18): K 4.6, creatinine 1.57 Labs (11/18): K 3.8, creatinine 1.29 Labs (2/19): K 5.1, creatinine 1.44 Labs (9/19): K 3.8, creatinine 1.08 Labs (11/19): K 4.3, creatinine 1.3 Labs (1/20): K 4.8, creatinine 1.68 Labs (3/20): K 4.4, creatinine 1.5 Labs (9/20): K 4.4, creatinine 1.77 => 1.48, LDL 109 Labs (10/20):  K 4.6, creatinine 1.66 Labs (12/20): K 4.7, creatinine 1.59 Labs (3/21): K 4.5, creatinine 1.73 Labs (6/21): K 4.6, creatinine 1.7 Labs (10/21): TSH normal, K 4.2, creatinine 1.37 Labs (3/22): K 4.7, creatinine 1.44 Labs (5/22): K 4.2, creatinine 1.75 Labs (9/22): K 4.5, creatinine 2.12 => 1.81 Labs (12/22): K 4, creatinine 1.31 Labs (3/23): K 4.2, creatinine 1.75, BNP 1276 Labs (6/23): TSH normal Labs (8/23): K 6.1 => 5.2, creatinine 1.95 => 2.88 => 2.21, BUN 120 => 111 Labs (10/23): K 4.6, creatinine 1.66 Labs (12/23): K 4.5, creatinine 2.43 Labs (2/24): K 4, creatinine 1.7, BNP 775 Labs (8/24): SPEP negative, K 5.4, creatinine 1.58, TSH normal  PMH: 1. Cardiomyopathy: Nonischemic.  Echo (9/14) with EF 40-45%.  Echo (2/16) with EF 25-30%.  Echo (4/16) with EF 30-35%, moderate MR.  Echo (6/16) with EF 40%, moderate LV dilation, moderate diastolic dysfunction, moderate MR, moderate TR, PA systolic pressure 52 mmHg. RHC/LHC (3/16) with no significant CAD, EF 20-25%, 3+ MR; mean RA 13, PA 70/30 mean 45, mean PCWP 31 mmHg, CI 2.2, PVR 3.1 WU.  Echo (12/16) with EF 45%, diffuse hypokinesis, moderate MR, normal RV size and systolic function.  - Echo (11/17): EF 45%, moderately dilated LV, moderate eccentric posterior MR, PASP 42 mmHg, normal RV size and systolic function.  - Echo (11/18): EF 45%, mild LV dilation, moderate diastolic dysfunction, normal RV size with mildly decreased systolic function, PASP 50 mmHg, moderate MR.  - Echo (11/19): EF 30-35%, mild LV dilation, severe LAE, mild-moderately decreased RV systolic function, possible severe MR with restricted posterior leaflet, PASP 50 mmHg.  - Echo (1/20): EF 30-35%, severe LV dilation, s/p Mitraclip with mild MR and mild MS, PASP 48 mmHg.  - RHC (1/20, with Mitraclip): mean RA 10, mean LA 18 - Echo (2/20): EF 30-35%, s/p mitraclip with mild mitral stenosis, mean MV gradient 5 mmHg, moderate MR, PASP 56 mmHg.  - Echo (9/20): EF 30-35%,  severe LAE, normal RV, s/p Mitraclip with mild-moderate MR and mild MS.  The IVC was dilated, suggesting volume overload.  - Echo (1/21): EF 40-45%, RV normal, biatrial dilation, mild mitral stenosis mean 5 mmHg, mild-moderate MR (s/p Mitraclip).  - Echo (1/22): EF 40-45% with moderate LV dilation, normal RV size and systolic function, severe LAE, s/p Mitraclip with probably mild mitral stenosis (mean gradient 7, MVA 2.2 cm^2 by PHT), moderate MR. - Echo (3/23): EF 40-45%, moderate LV dilation, normal RV, normal IVC, s/p MV repair with Mitraclip with mild mitral stenosis mean gradient 7 mmHg, moderate MR.  - Echo (9/23): EF 40-45%, grade I DD, normal RV, s/p MC repair with Mitraclip with mild mitral stenosis mean gradient 7 mmHg, moderate MR. - Echo (9/24): EF 40-45%, mild LV dilation and mild LVH, mildly decreased RV systolic function, PASP 47 mmHg, s/p Mitraclip with mean gradient 6 mmHg, suspect severe mitral regurgitation, IVC normal, small secundum ASD.  2. PVCs: Holter (10/15) with 12% PVCs. 3. Pulmonary venous hypertension: CTA chest (7/16) negative  for PE. PFTs (7/16) with minimal obstruction, mild restriction, moderately decreased DLCO.  4. Thyroid nodules: Biopsy benign.  5. CKD stage 3: Episodes of AKI/hyperkalemia in 8/23.  6. Mitral regurgitation: TEE (11/19) with EF 30-35%, moderate LV dilation, severe MR with posterior leaflet restriction and ERO 0.41 cm^2, no mitral stenosis, mild to moderately decreased RV systolic function, peak RV-RA gradient 50 mmHg.  - Mitraclip placement x 2 in 1/20 with 1+ residual MR.  - Echo (2/20) with moderate residual MR, mild MS with mean gradient 5 mmHg.  - Echo (9/20) with mild-moderate MR, mild MS.  - Echo (1/21) with mild-moderate MR, mild MS s/p Mitraclip - Echo (1/22) with moderate residual MR, mild-moderate mitral stenosis.  - Echo (3/23) with moderate MR, mild MS s/p Mitraclip.  - Echo (9/23) with mild-moderate MR, mild MS mean gradient 7 mmHg -  Echo (9/24) with severe MR, mild MS mean gradient 6 mmHg post-Mitraclip.  7. Hyperthyroism: Toxic multinodular goiter.   SH: Lives alone, only child passed away in 2023-06-16, nonsmoker, no ETOH or drugs.   FH: Father with MIs  ROS: All systems reviewed and negative except as per HPI.   Current Outpatient Medications  Medication Sig Dispense Refill   aspirin 81 MG EC tablet Take 1 tablet (81 mg total) by mouth daily.     Calcium Carb-Cholecalciferol (CALCIUM + VITAMIN D3 PO) Take 1 tablet by mouth daily.     carvedilol (COREG) 6.25 MG tablet Take 1 tablet by mouth twice daily 180 tablet 0   empagliflozin (JARDIANCE) 10 MG TABS tablet TAKE 1 TABLET BY MOUTH ONCE DAILY BEFORE BREAKFAST 90 tablet 3   ibandronate (BONIVA) 150 MG tablet Take 150 mg by mouth every 30 (thirty) days.     torsemide (DEMADEX) 20 MG tablet TAKE 2 TABLETS(40 MG) ALTERNATING WITH 1 TABLET (20 MG). (2 TABS ONCE DAILY, 1 TAB THE NEXT DAY). 90 tablet 0   Vericiguat (VERQUVO) 2.5 MG TABS Take 1 tablet (2.5 mg total) by mouth daily. 30 tablet 11   No current facility-administered medications for this encounter.   BP 110/70   Pulse 64   Wt 80.1 kg (176 lb 9.6 oz)   SpO2 96%   BMI 31.28 kg/m    Wt Readings from Last 3 Encounters:  08/26/23 80.1 kg (176 lb 9.6 oz)  05/26/23 82 kg (180 lb 12.8 oz)  01/15/23 83 kg (183 lb)   General: NAD Neck: No JVD, multinodular goiter  Lungs: Clear to auscultation bilaterally with normal respiratory effort. CV: Nondisplaced PMI.  Heart regular S1/S2, no S3/S4, no murmur.  No peripheral edema.  No carotid bruit.  Normal pedal pulses.  Abdomen: Soft, nontender, no hepatosplenomegaly, no distention.  Skin: Intact without lesions or rashes.  Neurologic: Alert and oriented x 3.  Psych: Normal affect. Extremities: No clubbing or cyanosis.  HEENT: Normal.   Assessment/Plan: 1. Chronic systolic CHF: Nonischemic cardiomyopathy.  EF up to 45% on 12/16, repeat echo 11/18 shows that EF  remains about 45%.  Etiology of NICM still unclear. Had frequent PVCs by past holter (12%) but doesn't explain her cardiomyopathy. Had improvement of palpitations on Coreg. Cannot rule out myocarditis. May also have component of Takotsubo with passing of child but EF has not improved as completely as would be expected with Takotsubo. Have previously discussed cardiac MRI to look for infiltrative disease but she says that she would be too claustrophobic.  Echo in 11/19 showed EF down to 30-35% and mild to moderate RV systolic  function.  There also appeared to be severe mitral regurgitation, which seemed progressive from the past. TEE in 11/19 confirmed severe MR with restricted posterior leaflet => Mitraclip 1/20 with decrease in MR to 1+.  Echo in 1/20 showed EF 30-35% with mild MR s/p Mitraclip. Echo in 9/20 with stable EF 30-35% but dilated IVC suggestive of volume overload.  Echo in 1/22 with EF 40-45%, normal RV.  Echo in 3/23 showed  EF 40-45%, moderate LV dilation, normal RV, normal IVC, s/p MV repair with Mitraclip with mild mitral stenosis mean gradient 7 mmHg, moderate MR. Echo (9/23) showed EF stable 40-45%, mild MS (mean gradient 7 mmHg) and mild to moderate MR s/p Mitraclip.  Echo today showed EF 40-45%, mild LV dilation and mild LVH, mildly decreased RV systolic function, PASP 47 mmHg, s/p Mitraclip with mean gradient 6 mmHg, suspect severe mitral regurgitation, IVC normal, small secundum ASD.  Mitral regurgitation looks worse today than on the past few echoes.  NYHA class II, not volume overloaded on exam.  GDMT has been limited by soft BP and CKD.  - Continue torsemide 20 mg daily alternating with 40 mg daily. BMET/BNP today.  - Continue Coreg 6.25 mg bid. - Continue empagliflozin 10 mg daily.    - She will stay off spironolactone and Entresto for now with elevated creatinine and history of hyperkalemia.  Of note, she did not tolerate Bidil in the past.    - Continue Verquvo 2.5 mg daily, I will  not increase this as she thinks it causes arm pain.   - EF now above ICD range.   2. PVCs: Frequent (12%) on 10/15 holter.   Pt refused repeat holter monitor for further assessment. PVC count was probably not high enough to cause cardiomyopathy, unlikely primary etiology.  No palpitations currently.  - Continue Coreg. 3. Pulmonary hypertension: Primarily pulmonary venous HTN (PVR 3.1) from elevated left atrial pressure (previous RHC above).   - Best treatment remains adequate diuresis. 4. Mitral regurgitation:  TEE in 11/19 with severe MR, restricted posterior leaflet.  She had Mitraclip x 2 in 1/20. Post-op echo showed mild MR and mild mitral stenosis.  Echo in 1/22 showed moderate MR, mild-moderate MS. Echo in 3/23 showed moderate MR, mild-moderate MS. Echo 9/23 showed mild-moderate MR, mild MS (mean gradient 7 mmHg).  Echo today showed MV s/p Mitraclip x 2 with mean gradient 6 mmHg and severe mitral regurgitation.  - With worsening of mitral regurgitation to severe range, I am going to do a TEE to reassess the mitral valve to see if it would be feasible to add an additional Mitraclip. We discussed risks/benefits and she agrees to procedure.  - Will need antibiotics for dental prophylaxis.  5. 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 TSH was normal in 8/24.  - Followup with endocrinology.  6. 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  - BMET today.  - Follows with nephrology.   Followup in 3 months APP.    Signed, Marca Ancona, MD  08/26/2023  [facility] [address] [facility] [place] [address] [phone] (office) [phone] (fax)

## 2023-08-26 NOTE — Progress Notes (Signed)
ID:  Vanessa [name], DOB October 03, 1951, MRN [id number]   Provider location: [facility] Type of Visit: Established patient   PCP:  Milus Height, PA  HF Cardiologist: Dr. Shirlee Latch   HPI: Vanessa [name] is a 72 y.o. with history of nonischemic cardiomyopathy and presents for followup of CHF. Patient has had a low EF recognized since at least 9/14, when echo showed EF 40-45%.  In 07-03-23, her child died, which led to significant stress.  She developed worsening exertional dyspnea and subsequent echo showed a fall in EF to 25-30%.  She had left and right heart catheterization in 3/16 with no significant coronary disease detected and moderately elevated right and left heart filling pressures along with pulmonary venous hypertension.  Echo was done 12/16: EF 45%, diffuse hypokinesis. She had a repeat echo 11/17 => EF 45% with moderately dilated LV, moderate MR.  Echo 11/18 showed EF stable at 45% with mild LV dilation and mildly decreased RV systolic function, moderate MR.   Spironolactone was stopped due to elevated K. She apparently did not tolerate Veltassa well.   Echo was done in 11/19.  EF 30-35% with mild LV hypertrophy, mild-moderately decreased RV systolic function, possible severe MR with restricted posterior leaflet, PASP 50 mmHg, severe LAE.  TEE was done in 11/19 to assess mitral regurgitation.  This showed severe MR with restricted posterior leaflet.   In 1/20, she had Mitraclip with reduction in MR to 1+.    She had echo in 9/20 with EF 30-35%, severe LAE, normal RV, s/p Mitraclip with mild-moderate MR and mild MS.  The IVC was dilated, suggesting volume overload.   She was unable to tolerate Bidil due to lightheadedness.   Echo in 1/21 showed EF 40-45%, RV normal, biatrial dilation, mild mitral stenosis mean 5 mmHg, mild-moderate MR (s/p Mitraclip).  Echo in 1/22 showed EF 40-45% with moderate LV dilation, normal RV size and systolic function, severe LAE,  s/p Mitraclip with probably mild mitral stenosis (mean gradient 7, MVA 2.2 cm^2 by PHT), moderate MR.   Echo in 3/23 showed EF 40-45%, moderate LV dilation, normal RV, normal IVC, s/p MV repair with Mitraclip with mild mitral stenosis mean gradient 7 mmHg, moderate MR.   Patient had labs done in 8/23 showing K up to 6.1 with AKI/creatinine 2.88.  She was admitted and K was treated.  Entresto, spironolactone, Coreg, and Veltassa were stopped.  Torsemide was decreased to 10 mg daily.   Echo (9/23) showed EF stable 40-45%, mild MS (mean gradient 7 mmHg) and mild to moderate MR s/p Mitraclip.   Echo was done today and reviewed, EF 40-45%, mild LV dilation and mild LVH, mildly decreased RV systolic function, PASP 47 mmHg, s/p Mitraclip with mean gradient 6 mmHg, suspect severe mitral regurgitation, IVC normal, small secundum ASD.   Today she returns for HF follow up. Weight is down 4 lbs.  She has had episodes of transient left arm "squeezing" since starting Verquvo.  Episodes last a few minutes and are not exertional.  She still works part time at a daycare center but overall is not very active.  She is short of breath walking up stairs, no problems on flat ground.  No chest pain. No orthopnea/PND.  No lightheadedness.   ECG (personally reviewed): NSR, PVC, LVH with repolarization abnormality  Labs (7/16): K 4.5, creatinine 1.12, BNP 596 Labs (8/16): K 4 => 5.1, creatinine 1.29 => 1.5, BNP 388 => 426 Labs (  9/16): K 4, creatinine 1.08, BNP 502 Labs (12/16): K 4.9, creatinine 1.57 Labs (5/17): K 5.2, creatinine 1.3 Labs (8/17): BNP 152 Labs (11/17): K 4.9, creatinine 1.32, BNP 236 Labs (8/18): K 4.6, creatinine 1.57 Labs (11/18): K 3.8, creatinine 1.29 Labs (2/19): K 5.1, creatinine 1.44 Labs (9/19): K 3.8, creatinine 1.08 Labs (11/19): K 4.3, creatinine 1.3 Labs (1/20): K 4.8, creatinine 1.68 Labs (3/20): K 4.4, creatinine 1.5 Labs (9/20): K 4.4, creatinine 1.77 => 1.48, LDL 109 Labs (10/20):  K 4.6, creatinine 1.66 Labs (12/20): K 4.7, creatinine 1.59 Labs (3/21): K 4.5, creatinine 1.73 Labs (6/21): K 4.6, creatinine 1.7 Labs (10/21): TSH normal, K 4.2, creatinine 1.37 Labs (3/22): K 4.7, creatinine 1.44 Labs (5/22): K 4.2, creatinine 1.75 Labs (9/22): K 4.5, creatinine 2.12 => 1.81 Labs (12/22): K 4, creatinine 1.31 Labs (3/23): K 4.2, creatinine 1.75, BNP 1276 Labs (6/23): TSH normal Labs (8/23): K 6.1 => 5.2, creatinine 1.95 => 2.88 => 2.21, BUN 120 => 111 Labs (10/23): K 4.6, creatinine 1.66 Labs (12/23): K 4.5, creatinine 2.43 Labs (2/24): K 4, creatinine 1.7, BNP 775 Labs (8/24): SPEP negative, K 5.4, creatinine 1.58, TSH normal  PMH: 1. Cardiomyopathy: Nonischemic.  Echo (9/14) with EF 40-45%.  Echo (2/16) with EF 25-30%.  Echo (4/16) with EF 30-35%, moderate MR.  Echo (6/16) with EF 40%, moderate LV dilation, moderate diastolic dysfunction, moderate MR, moderate TR, PA systolic pressure 52 mmHg. RHC/LHC (3/16) with no significant CAD, EF 20-25%, 3+ MR; mean RA 13, PA 70/30 mean 45, mean PCWP 31 mmHg, CI 2.2, PVR 3.1 WU.  Echo (12/16) with EF 45%, diffuse hypokinesis, moderate MR, normal RV size and systolic function.  - Echo (11/17): EF 45%, moderately dilated LV, moderate eccentric posterior MR, PASP 42 mmHg, normal RV size and systolic function.  - Echo (11/18): EF 45%, mild LV dilation, moderate diastolic dysfunction, normal RV size with mildly decreased systolic function, PASP 50 mmHg, moderate MR.  - Echo (11/19): EF 30-35%, mild LV dilation, severe LAE, mild-moderately decreased RV systolic function, possible severe MR with restricted posterior leaflet, PASP 50 mmHg.  - Echo (1/20): EF 30-35%, severe LV dilation, s/p Mitraclip with mild MR and mild MS, PASP 48 mmHg.  - RHC (1/20, with Mitraclip): mean RA 10, mean LA 18 - Echo (2/20): EF 30-35%, s/p mitraclip with mild mitral stenosis, mean MV gradient 5 mmHg, moderate MR, PASP 56 mmHg.  - Echo (9/20): EF 30-35%,  severe LAE, normal RV, s/p Mitraclip with mild-moderate MR and mild MS.  The IVC was dilated, suggesting volume overload.  - Echo (1/21): EF 40-45%, RV normal, biatrial dilation, mild mitral stenosis mean 5 mmHg, mild-moderate MR (s/p Mitraclip).  - Echo (1/22): EF 40-45% with moderate LV dilation, normal RV size and systolic function, severe LAE, s/p Mitraclip with probably mild mitral stenosis (mean gradient 7, MVA 2.2 cm^2 by PHT), moderate MR. - Echo (3/23): EF 40-45%, moderate LV dilation, normal RV, normal IVC, s/p MV repair with Mitraclip with mild mitral stenosis mean gradient 7 mmHg, moderate MR.  - Echo (9/23): EF 40-45%, grade I DD, normal RV, s/p MC repair with Mitraclip with mild mitral stenosis mean gradient 7 mmHg, moderate MR. - Echo (9/24): EF 40-45%, mild LV dilation and mild LVH, mildly decreased RV systolic function, PASP 47 mmHg, s/p Mitraclip with mean gradient 6 mmHg, suspect severe mitral regurgitation, IVC normal, small secundum ASD.  2. PVCs: Holter (10/15) with 12% PVCs. 3. Pulmonary venous hypertension: CTA chest (7/16) negative  for PE. PFTs (7/16) with minimal obstruction, mild restriction, moderately decreased DLCO.  4. Thyroid nodules: Biopsy benign.  5. CKD stage 3: Episodes of AKI/hyperkalemia in 8/23.  6. Mitral regurgitation: TEE (11/19) with EF 30-35%, moderate LV dilation, severe MR with posterior leaflet restriction and ERO 0.41 cm^2, no mitral stenosis, mild to moderately decreased RV systolic function, peak RV-RA gradient 50 mmHg.  - Mitraclip placement x 2 in 1/20 with 1+ residual MR.  - Echo (2/20) with moderate residual MR, mild MS with mean gradient 5 mmHg.  - Echo (9/20) with mild-moderate MR, mild MS.  - Echo (1/21) with mild-moderate MR, mild MS s/p Mitraclip - Echo (1/22) with moderate residual MR, mild-moderate mitral stenosis.  - Echo (3/23) with moderate MR, mild MS s/p Mitraclip.  - Echo (9/23) with mild-moderate MR, mild MS mean gradient 7 mmHg -  Echo (9/24) with severe MR, mild MS mean gradient 6 mmHg post-Mitraclip.  7. Hyperthyroism: Toxic multinodular goiter.   SH: Lives alone, only child passed away in 2023-06-16, nonsmoker, no ETOH or drugs.   FH: Father with MIs  ROS: All systems reviewed and negative except as per HPI.   Current Outpatient Medications  Medication Sig Dispense Refill   aspirin 81 MG EC tablet Take 1 tablet (81 mg total) by mouth daily.     Calcium Carb-Cholecalciferol (CALCIUM + VITAMIN D3 PO) Take 1 tablet by mouth daily.     carvedilol (COREG) 6.25 MG tablet Take 1 tablet by mouth twice daily 180 tablet 0   empagliflozin (JARDIANCE) 10 MG TABS tablet TAKE 1 TABLET BY MOUTH ONCE DAILY BEFORE BREAKFAST 90 tablet 3   ibandronate (BONIVA) 150 MG tablet Take 150 mg by mouth every 30 (thirty) days.     torsemide (DEMADEX) 20 MG tablet TAKE 2 TABLETS(40 MG) ALTERNATING WITH 1 TABLET (20 MG). (2 TABS ONCE DAILY, 1 TAB THE NEXT DAY). 90 tablet 0   Vericiguat (VERQUVO) 2.5 MG TABS Take 1 tablet (2.5 mg total) by mouth daily. 30 tablet 11   No current facility-administered medications for this encounter.   BP 110/70   Pulse 64   Wt 80.1 kg (176 lb 9.6 oz)   SpO2 96%   BMI 31.28 kg/m    Wt Readings from Last 3 Encounters:  08/26/23 80.1 kg (176 lb 9.6 oz)  05/26/23 82 kg (180 lb 12.8 oz)  01/15/23 83 kg (183 lb)   General: NAD Neck: No JVD, multinodular goiter  Lungs: Clear to auscultation bilaterally with normal respiratory effort. CV: Nondisplaced PMI.  Heart regular S1/S2, no S3/S4, no murmur.  No peripheral edema.  No carotid bruit.  Normal pedal pulses.  Abdomen: Soft, nontender, no hepatosplenomegaly, no distention.  Skin: Intact without lesions or rashes.  Neurologic: Alert and oriented x 3.  Psych: Normal affect. Extremities: No clubbing or cyanosis.  HEENT: Normal.   Assessment/Plan: 1. Chronic systolic CHF: Nonischemic cardiomyopathy.  EF up to 45% on 12/16, repeat echo 11/18 shows that EF  remains about 45%.  Etiology of NICM still unclear. Had frequent PVCs by past holter (12%) but doesn't explain her cardiomyopathy. Had improvement of palpitations on Coreg. Cannot rule out myocarditis. May also have component of Takotsubo with passing of child but EF has not improved as completely as would be expected with Takotsubo. Have previously discussed cardiac MRI to look for infiltrative disease but she says that she would be too claustrophobic.  Echo in 11/19 showed EF down to 30-35% and mild to moderate RV systolic  function.  There also appeared to be severe mitral regurgitation, which seemed progressive from the past. TEE in 11/19 confirmed severe MR with restricted posterior leaflet => Mitraclip 1/20 with decrease in MR to 1+.  Echo in 1/20 showed EF 30-35% with mild MR s/p Mitraclip. Echo in 9/20 with stable EF 30-35% but dilated IVC suggestive of volume overload.  Echo in 1/22 with EF 40-45%, normal RV.  Echo in 3/23 showed  EF 40-45%, moderate LV dilation, normal RV, normal IVC, s/p MV repair with Mitraclip with mild mitral stenosis mean gradient 7 mmHg, moderate MR. Echo (9/23) showed EF stable 40-45%, mild MS (mean gradient 7 mmHg) and mild to moderate MR s/p Mitraclip.  Echo today showed EF 40-45%, mild LV dilation and mild LVH, mildly decreased RV systolic function, PASP 47 mmHg, s/p Mitraclip with mean gradient 6 mmHg, suspect severe mitral regurgitation, IVC normal, small secundum ASD.  Mitral regurgitation looks worse today than on the past few echoes.  NYHA class II, not volume overloaded on exam.  GDMT has been limited by soft BP and CKD.  - Continue torsemide 20 mg daily alternating with 40 mg daily. BMET/BNP today.  - Continue Coreg 6.25 mg bid. - Continue empagliflozin 10 mg daily.    - She will stay off spironolactone and Entresto for now with elevated creatinine and history of hyperkalemia.  Of note, she did not tolerate Bidil in the past.    - Continue Verquvo 2.5 mg daily, I will  not increase this as she thinks it causes arm pain.   - EF now above ICD range.   2. PVCs: Frequent (12%) on 10/15 holter.   Pt refused repeat holter monitor for further assessment. PVC count was probably not high enough to cause cardiomyopathy, unlikely primary etiology.  No palpitations currently.  - Continue Coreg. 3. Pulmonary hypertension: Primarily pulmonary venous HTN (PVR 3.1) from elevated left atrial pressure (previous RHC above).   - Best treatment remains adequate diuresis. 4. Mitral regurgitation:  TEE in 11/19 with severe MR, restricted posterior leaflet.  She had Mitraclip x 2 in 1/20. Post-op echo showed mild MR and mild mitral stenosis.  Echo in 1/22 showed moderate MR, mild-moderate MS. Echo in 3/23 showed moderate MR, mild-moderate MS. Echo 9/23 showed mild-moderate MR, mild MS (mean gradient 7 mmHg).  Echo today showed MV s/p Mitraclip x 2 with mean gradient 6 mmHg and severe mitral regurgitation.  - With worsening of mitral regurgitation to severe range, I am going to do a TEE to reassess the mitral valve to see if it would be feasible to add an additional Mitraclip. We discussed risks/benefits and she agrees to procedure.  - Will need antibiotics for dental prophylaxis.  5. 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 TSH was normal in 8/24.  - Followup with endocrinology.  6. 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  - BMET today.  - Follows with nephrology.   Followup in 3 months APP.    Signed, Marca Ancona, MD  08/26/2023  [facility] [address] [facility] [place] [address] [phone] (office) [phone] (fax)

## 2023-08-26 NOTE — Patient Instructions (Signed)
Medication Changes:  HOLD VERQUVO FOR 1 WEEK IF YOUR ARM PAIN RESOLVES YOU CAN STOP TAKING THIS, IF IT DOES NOT RESOLVE PLEASE RESTART THIS   Lab Work:  Labs done today, your results will be available in MyChart, we will contact you for abnormal readings.  Follow-Up in: 3 MONTHS AS SCHEDULED WITH APP   At the [facility], you and your health needs are our priority. We have a designated team specialized in the treatment of Heart Failure. This Care Team includes your primary Heart Failure Specialized Cardiologist (physician), Advanced Practice Providers (APPs- Physician Assistants and Nurse Practitioners), and Pharmacist who all work together to provide you with the care you need, when you need it.   You may see any of the following providers on your designated Care Team at your next follow up:  Dr. Arvilla Meres Dr. Marca Ancona Dr. Marcos Eke, NP Robbie Lis, [place] [facility] [place], [place] Brynda Peon, NP Karle Plumber, PharmD   Please be sure to bring in all your medications bottles to every appointment.   Need to Contact us:  If you have any questions or concerns before your next appointment please send [place] a message through [place] or call our office at [phone].    TO LEAVE A MESSAGE FOR THE NURSE SELECT OPTION 2, PLEASE LEAVE A MESSAGE INCLUDING: YOUR NAME DATE OF BIRTH CALL BACK NUMBER REASON FOR CALL**this is important as we prioritize the call backs  YOU WILL RECEIVE A CALL BACK THE SAME DAY AS LONG AS YOU CALL BEFORE 4:00 PM

## 2023-09-06 IMAGING — US US THYROID
1 series · 13 of 25 positions shown · non-contrast
Comparison: Prior thyroid ultrasound 07/04/2015

CLINICAL DATA: Goiter. History of prior thyroid biopsy in 6722.
Patient previously underwent biopsy of left lower pole/isthmus
nodule.

EXAM:
THYROID ULTRASOUND
TECHNIQUE: Ultrasound examination of the thyroid gland and adjacent soft
tissues was performed.

[Series 1: us thyroid · 76 acquisitions, 13 frames shown]
[im 1/76]
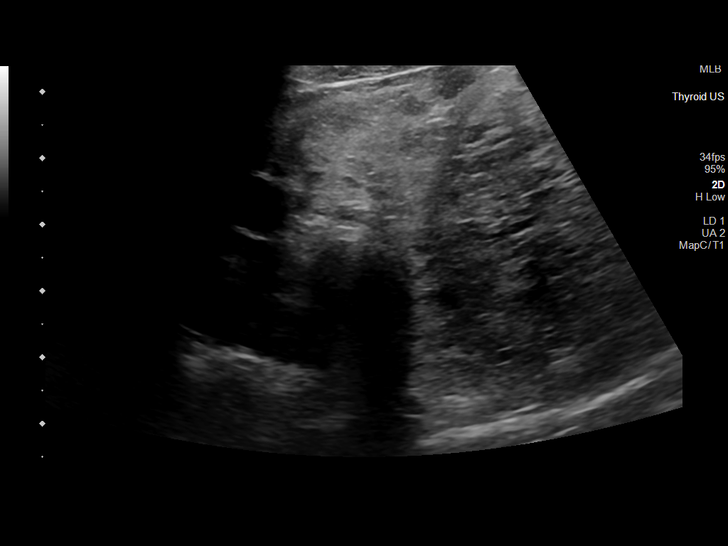
[im 7/76]
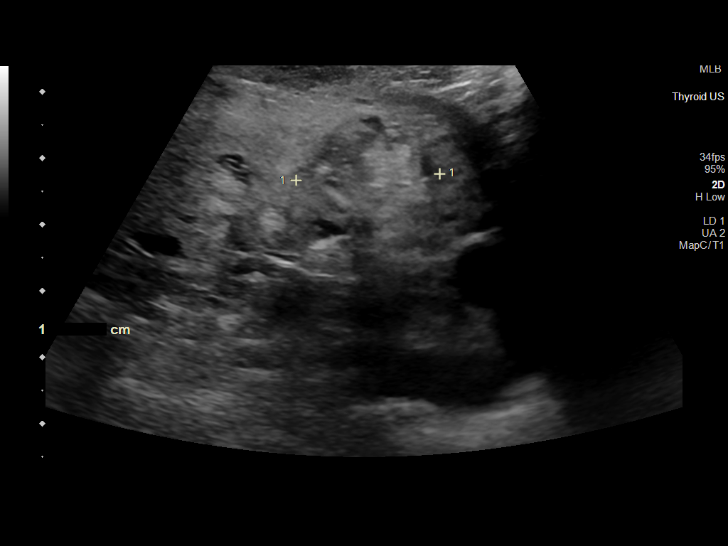
[im 13/76]
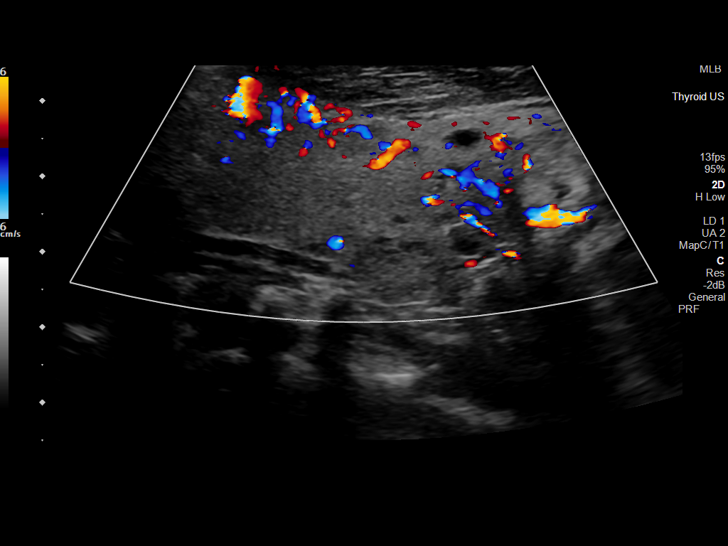
[im 19/76]
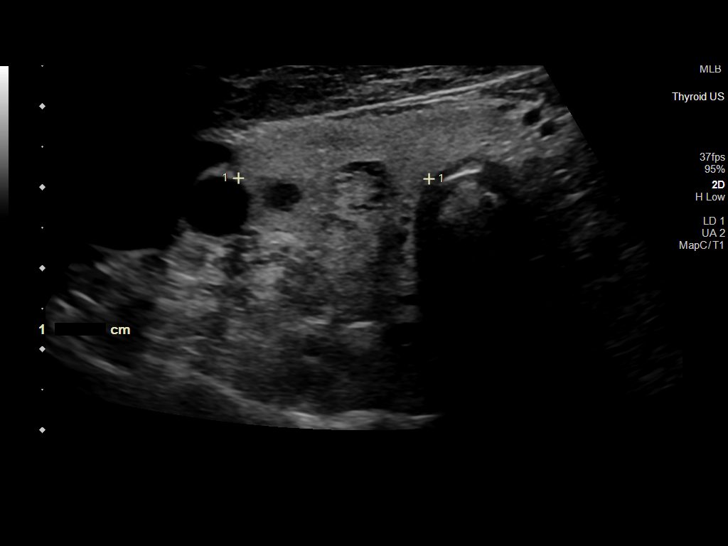
[im 26/76]
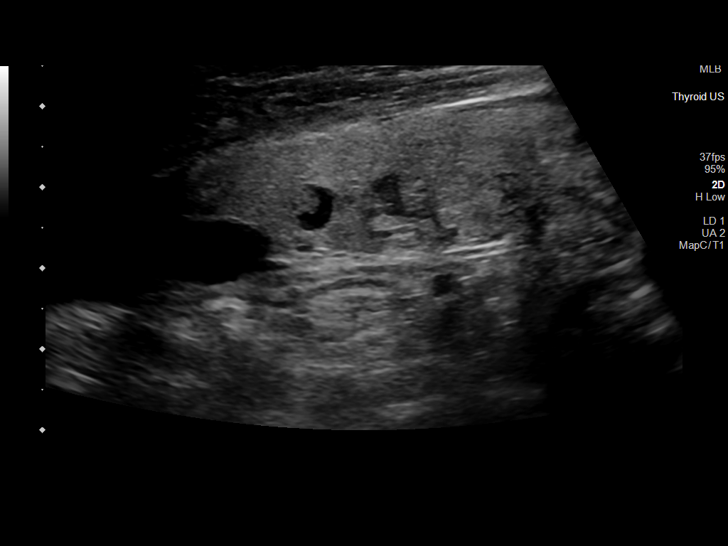
[im 32/76]
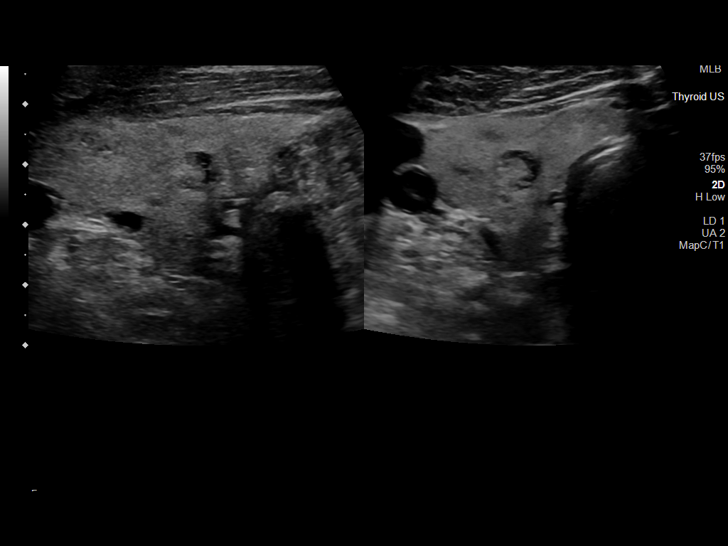
[im 38/76]
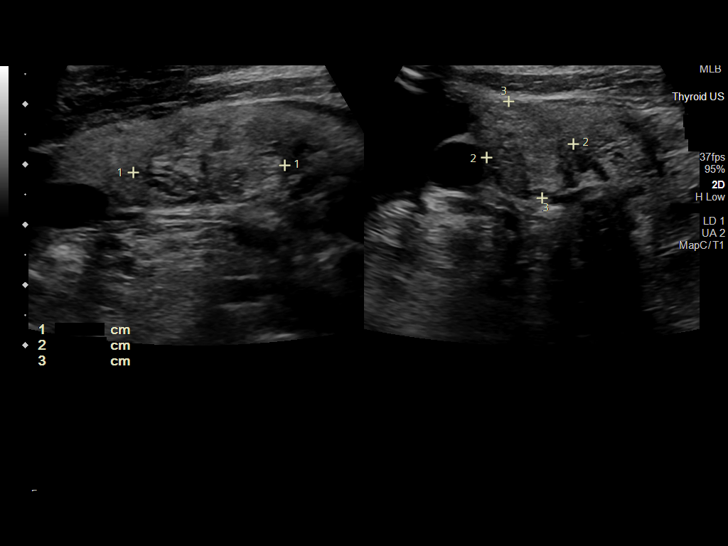
[im 44/76]
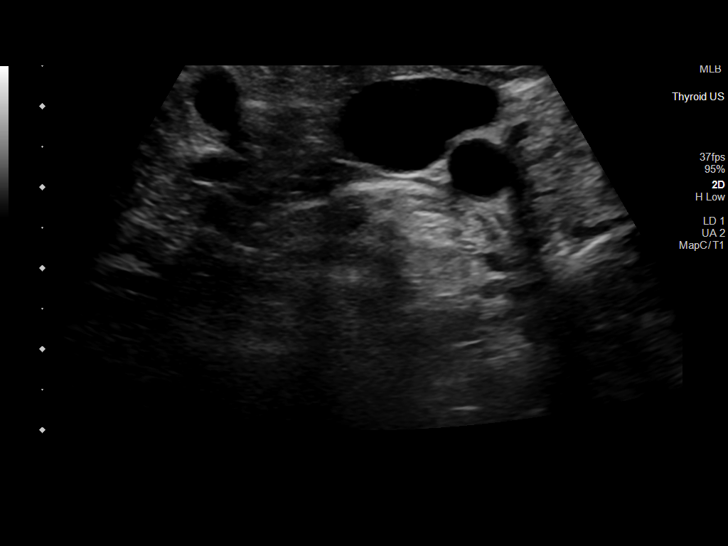
[im 51/76]
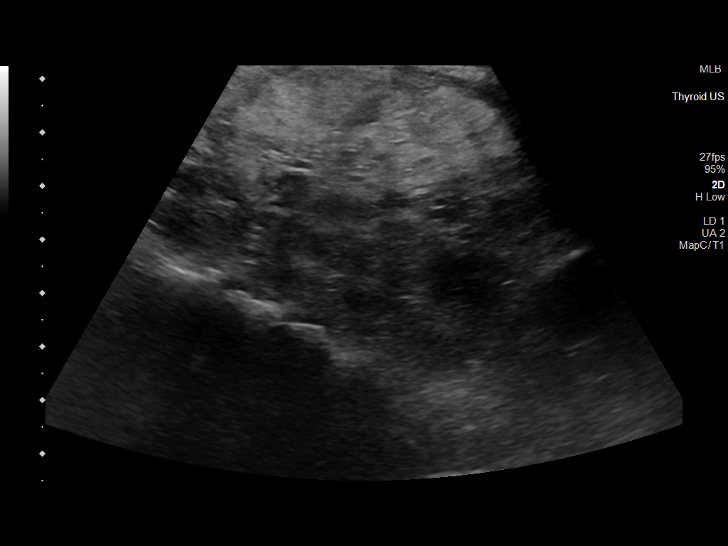
[im 57/76]
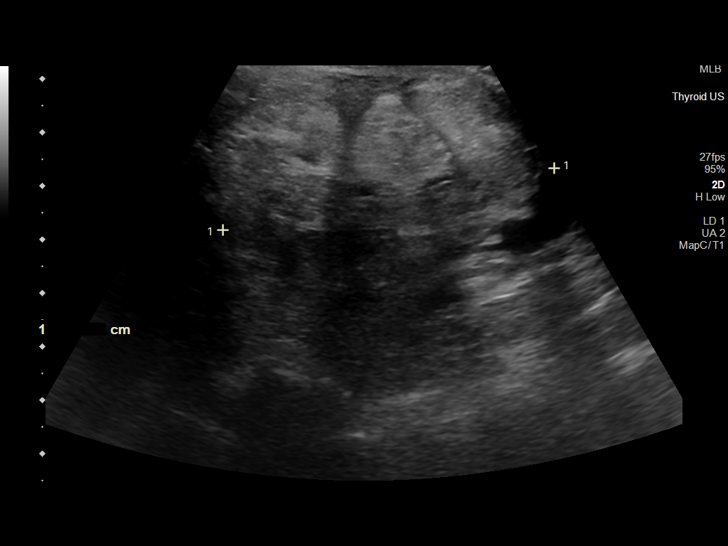
[im 63/76]
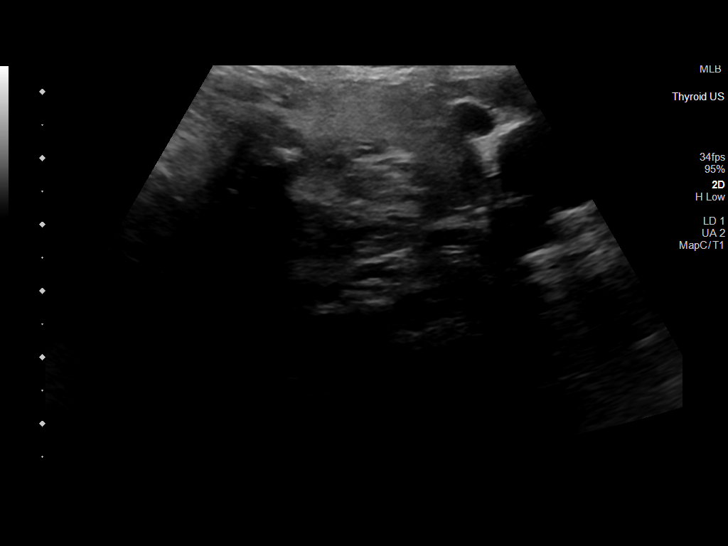
[im 69/76]
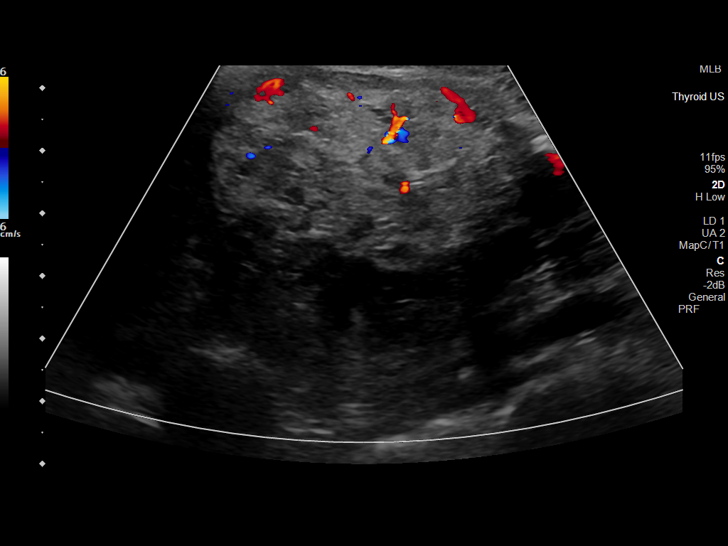
[im 76/76]
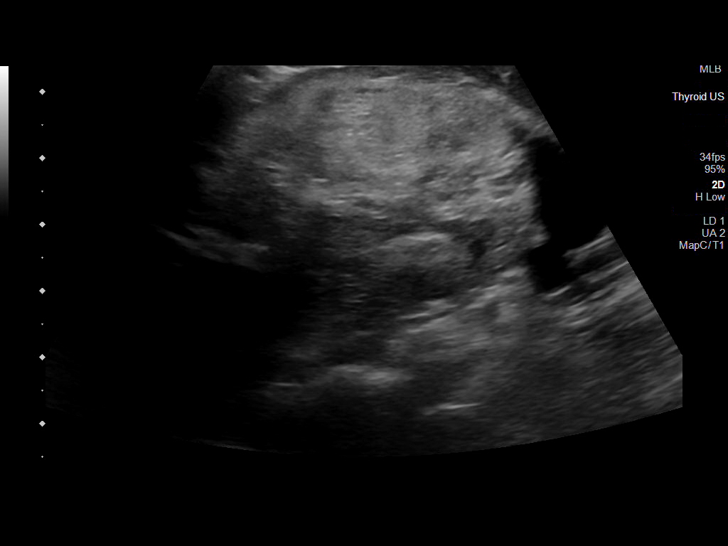

[13 of 25 positions shown; findings below may reference images not displayed]

FINDINGS: Parenchymal Echotexture: Markedly heterogenous

Isthmus: 2.0 cm

Right lobe: 6.8 x 1.8 x 2.4 cm

Left lobe: 11.0 x 5.2 x 6.3 cm

_________________________________________________________

Estimated total number of nodules >/= 1 cm: 6-10

Number of spongiform nodules >/=  2 cm not described below (TR1): 0

Number of mixed cystic and solid nodules >/= 1.5 cm not described
below (TR2): 0

_________________________________________________________

[place] enlarged, heterogeneous and multinodular thyroid gland most
consistent with that a multinodular goiter.

The previously biopsied mass occupying the majority of the left
lower gland demonstrates slow interval enlargement to approximately
6.2 x 5.0 x 4.7 cm compared to 5.6 x 3.9 x 4.6 cm previously.

Additional nodules are present scattered throughout the right gland
and isthmus. Precise measurement difficult given the overall
heterogeneity of the thyroid gland and the ill-defined nature of the
nodules. Overall, comparing to the prior study from 6722, the other
nodules demonstrate no significant interval change thus confirming
greater than 5 year stability and thus benignity of all measured
nodules.

No nodules with suspicious features are identified.
IMPRESSION: 1. Subtle interval enlargement of previously biopsied mass occupying
the majority of the left inferior gland. Recommend correlation with
prior biopsy results.
2. Greater than 5 year stability of additional scattered thyroid
nodules in the isthmus and right gland consistent with benignity.

The above is in keeping with the ACR TI-RADS recommendations - [HOSPITAL] 0718;[DATE].

## 2023-09-06 NOTE — Anesthesia Preprocedure Evaluation (Signed)
Anesthesia Evaluation  Patient identified by MRN, date of birth, ID band Patient awake    Reviewed: Allergy & Precautions, NPO status , Patient's Chart, lab work & pertinent test results, reviewed documented beta blocker date and time   Airway Mallampati: III  TM Distance: >3 FB Neck ROM: Full    Dental  (+) Teeth Intact, Dental Advisory Given   Pulmonary neg pulmonary ROS   Pulmonary exam normal breath sounds clear to auscultation       Cardiovascular hypertension, Pt. on home beta blockers and Pt. on medications pulmonary hypertension+CHF  + dysrhythmias + Valvular Problems/Murmurs MR  Rhythm:Regular Rate:Normal + Systolic murmurs    Neuro/Psych negative neurological ROS     GI/Hepatic negative GI ROS, Neg liver ROS,,,  Endo/Other  negative endocrine ROS    Renal/GU Renal InsufficiencyRenal disease     Musculoskeletal negative musculoskeletal ROS (+)    Abdominal   Peds  Hematology negative hematology ROS (+)   Anesthesia Other Findings   Reproductive/Obstetrics                             Anesthesia Physical Anesthesia Plan  ASA: 4  Anesthesia Plan: MAC   Post-op Pain Management: Minimal or no pain anticipated   Induction: Intravenous  PONV Risk Score and Plan: 2 and TIVA  Airway Management Planned: Simple Face Mask and Natural Airway  Additional Equipment:   Intra-op Plan:   Post-operative Plan:   Informed Consent: I have reviewed the patients History and Physical, chart, labs and discussed the procedure including the risks, benefits and alternatives for the proposed anesthesia with the patient or authorized representative who has indicated his/her understanding and acceptance.     Dental advisory given  Plan Discussed with: CRNA  Anesthesia Plan Comments:        Anesthesia Quick Evaluation

## 2023-09-06 NOTE — Progress Notes (Signed)
Spoke to patient and instructed them to come at 06:30  and to be NPO after 0000.    Confirmed that patient will have a ride home and someone to stay with them for 24 hours after the procedure.

## 2023-09-07 ENCOUNTER — Ambulatory Visit (HOSPITAL_BASED_OUTPATIENT_CLINIC_OR_DEPARTMENT_OTHER): Payer: PPO | Admitting: Anesthesiology

## 2023-09-07 ENCOUNTER — Encounter (HOSPITAL_COMMUNITY)
Admission: RE | Disposition: A | Discharge status: Home / Self Care | Payer: Self-pay | Source: Home / Self Care | Attending: Cardiology

## 2023-09-07 ENCOUNTER — Ambulatory Visit (HOSPITAL_COMMUNITY)
Admission: RE | Admit: 2023-09-07 | Discharge: 2023-09-07 | Disposition: A | Discharge status: Home / Self Care | Payer: PPO | Attending: Cardiology | Admitting: Cardiology

## 2023-09-07 ENCOUNTER — Ambulatory Visit (HOSPITAL_COMMUNITY): Payer: Self-pay | Admitting: Anesthesiology

## 2023-09-07 ENCOUNTER — Ambulatory Visit (HOSPITAL_COMMUNITY): Payer: PPO

## 2023-09-07 ENCOUNTER — Other Ambulatory Visit: Payer: Self-pay

## 2023-09-07 DIAGNOSIS — I340 Nonrheumatic mitral (valve) insufficiency: Secondary | ICD-10-CM

## 2023-09-07 DIAGNOSIS — I5022 Chronic systolic (congestive) heart failure: Secondary | ICD-10-CM | POA: Insufficient documentation

## 2023-09-07 DIAGNOSIS — Z79899 Other long term (current) drug therapy: Secondary | ICD-10-CM | POA: Insufficient documentation

## 2023-09-07 DIAGNOSIS — I272 Pulmonary hypertension, unspecified: Secondary | ICD-10-CM | POA: Diagnosis not present

## 2023-09-07 DIAGNOSIS — I959 Hypotension, unspecified: Secondary | ICD-10-CM | POA: Insufficient documentation

## 2023-09-07 DIAGNOSIS — I428 Other cardiomyopathies: Secondary | ICD-10-CM | POA: Diagnosis not present

## 2023-09-07 DIAGNOSIS — E0520 Thyrotoxicosis with toxic multinodular goiter without thyrotoxic crisis or storm: Secondary | ICD-10-CM | POA: Diagnosis not present

## 2023-09-07 DIAGNOSIS — N183 Chronic kidney disease, stage 3 unspecified: Secondary | ICD-10-CM | POA: Insufficient documentation

## 2023-09-07 DIAGNOSIS — I130 Hypertensive heart and chronic kidney disease with heart failure and stage 1 through stage 4 chronic kidney disease, or unspecified chronic kidney disease: Secondary | ICD-10-CM | POA: Diagnosis not present

## 2023-09-07 DIAGNOSIS — I081 Rheumatic disorders of both mitral and tricuspid valves: Secondary | ICD-10-CM | POA: Diagnosis not present

## 2023-09-07 DIAGNOSIS — I493 Ventricular premature depolarization: Secondary | ICD-10-CM | POA: Insufficient documentation

## 2023-09-07 HISTORY — PX: TEE WITHOUT CARDIOVERSION: SHX5443

## 2023-09-07 LAB — ECHO TEE
MV M vel: 4.12 m/s
MV Peak grad: 67.9 mm[Hg]
Radius: 1.15 cm

## 2023-09-07 SURGERY — ECHOCARDIOGRAM, TRANSESOPHAGEAL
Anesthesia: Monitor Anesthesia Care

## 2023-09-07 MED ORDER — GLYCOPYRROLATE 0.2 MG/ML IJ SOLN
INTRAMUSCULAR | Status: DC | PRN
Start: 2023-09-07 — End: 2023-09-07
  Administered 2023-09-07: .2 mg via INTRAVENOUS

## 2023-09-07 MED ORDER — PHENYLEPHRINE HCL-NACL 20-0.9 MG/250ML-% IV SOLN: INTRAVENOUS | Status: DC | PRN

## 2023-09-07 MED ORDER — EPHEDRINE SULFATE-NACL 50-0.9 MG/10ML-% IV SOSY
PREFILLED_SYRINGE | INTRAVENOUS | Status: DC | PRN
Start: 2023-09-07 — End: 2023-09-07
  Administered 2023-09-07: 10 mg via INTRAVENOUS

## 2023-09-07 MED ORDER — PROPOFOL 10 MG/ML IV BOLUS
INTRAVENOUS | Status: DC | PRN
  Administered 2023-09-07: 50 mg via INTRAVENOUS
  Administered 2023-09-07 (×3): 20 mg via INTRAVENOUS
  Administered 2023-09-07: 30 mg via INTRAVENOUS

## 2023-09-07 MED ORDER — LIDOCAINE 2% (20 MG/ML) 5 ML SYRINGE
INTRAMUSCULAR | Status: DC | PRN
  Administered 2023-09-07: 80 mg via INTRAVENOUS

## 2023-09-07 MED ORDER — SODIUM CHLORIDE 0.9 % IV SOLN
INTRAVENOUS | Status: DC | PRN
Start: 2023-09-07 — End: 2023-09-07

## 2023-09-07 MED ORDER — SODIUM CHLORIDE 0.9 % IV SOLN
INTRAVENOUS | Status: DC
  Administered 2023-09-07: 20 mL/h via INTRAVENOUS

## 2023-09-07 MED ORDER — PHENYLEPHRINE HCL (PRESSORS) 10 MG/ML IV SOLN
INTRAVENOUS | Status: DC | PRN
Start: 2023-09-07 — End: 2023-09-07
  Administered 2023-09-07 (×3): 160 ug via INTRAVENOUS

## 2023-09-07 MED ORDER — PROPOFOL 500 MG/50ML IV EMUL
INTRAVENOUS | Status: DC | PRN
  Administered 2023-09-07: 25 ug/kg/min via INTRAVENOUS

## 2023-09-07 NOTE — CV Procedure (Addendum)
Procedure: TEE  Sedation: Per anesthesiology  Indication: Mitral regurgitation  Findings: Mitral regurgitation looks severe, appears to originate between and medial to the 2 Mitraclips that are in place.  Mean gradient 4 mmHg.  We had to stop the study early due to hypotension with sedation.   Please see echo section for full report.   Marca Ancona 09/07/2023 8:24 AM

## 2023-09-07 NOTE — Anesthesia Postprocedure Evaluation (Signed)
Anesthesia Post Note  [name]  Procedure(s) Performed: TRANSESOPHAGEAL ECHOCARDIOGRAM     Patient location during evaluation: Cath Lab Anesthesia Type: MAC Level of consciousness: awake and alert Pain management: pain level controlled Vital Signs Assessment: post-procedure vital signs reviewed and stable Respiratory status: spontaneous breathing, nonlabored ventilation, respiratory function stable and patient connected to nasal cannula oxygen Cardiovascular status: stable and blood pressure returned to baseline Postop Assessment: no apparent nausea or vomiting Anesthetic complications: no   No notable events documented.  Last Vitals:  Vitals:   09/07/23 0840 09/07/23 0845  BP: 93/66 102/72  Pulse: 71 69  Resp: 18 19  Temp:  (!) 36.2 C  SpO2: 98% 97%    Last Pain:  Vitals:   09/07/23 0845  TempSrc: Temporal  PainSc: 0-No pain                 Collene Schlichter

## 2023-09-07 NOTE — Transfer of Care (Signed)
Immediate Anesthesia Transfer of Care Note  [name]  Procedure(s) Performed: TRANSESOPHAGEAL ECHOCARDIOGRAM  Patient Location: PACU and Cath Lab  Anesthesia Type:MAC  Level of Consciousness: awake and alert   Airway & Oxygen Therapy: Patient Spontanous Breathing and Patient connected to face mask oxygen  Post-op Assessment: Report given to RN and Post -op Vital signs reviewed and stable  Post vital signs: Reviewed and stable  Last Vitals:  Vitals Value Taken Time  BP 103/72 09/07/23 0825  Temp 36.6 C 09/07/23 0824  Pulse 77 09/07/23 0826  Resp 23 09/07/23 0826  SpO2 100 % 09/07/23 0826  Vitals shown include unfiled device data.  Last Pain:  Vitals:   09/07/23 0824  TempSrc: Temporal  PainSc: 0-No pain         Complications: No notable events documented.

## 2023-09-07 NOTE — Interval H&P Note (Signed)
History and Physical Interval Note:  09/07/2023 7:45 AM  Vanessa [name]  has presented today for surgery, with the diagnosis of MITRAL REGURGITATION.  The various methods of treatment have been discussed with the patient and family. After consideration of risks, benefits and other options for treatment, the patient has consented to  Procedure(s): TRANSESOPHAGEAL ECHOCARDIOGRAM (N/A) as a surgical intervention.  The patient's history has been reviewed, patient examined, no change in status, stable for surgery.  I have reviewed the patient's chart and labs.  Questions were answered to the patient's satisfaction.     Notnamed Scholz [employer]

## 2023-09-07 NOTE — Discharge Instructions (Signed)
Transesophageal Echocardiogram Transesophageal echocardiogram (TEE) is a test that uses sound waves to take pictures of your heart. TEE is done by passing a small probe attached to a flexible tube down the part of the body that moves food from your mouth to your stomach (esophagus). The pictures give clear images of your heart. This can help your doctor see if there are problems with your heart. Tell a doctor about: Any allergies you have. All medicines you are taking. This includes vitamins, herbs, eye drops, creams, and over-the-counter medicines. Any problems you or family members have had with anesthetic medicines. Any blood disorders you have. Any surgeries you have had. Any medical conditions you have. Any swallowing problems. Whether you have or have had a blockage in the part of the body that moves food from your mouth to your stomach. Whether you are pregnant or may be pregnant. What are the risks? In general, this is a safe procedure. But, problems may occur, such as: Damage to nearby structures or organs. A tear in the part of the body that moves food from your mouth to your stomach. Irregular heartbeat. Hoarse voice or trouble swallowing. Bleeding. What happens before the procedure? Medicines Ask your doctor about changing or stopping: Your normal medicines. Vitamins, herbs, and supplements. Over-the-counter medicines. Do not take aspirin or ibuprofen unless you are told to. General instructions Follow instructions from your doctor about what you cannot eat or drink. You will take out any dentures or dental retainers. Plan to have a responsible adult take you home from the hospital or clinic. Plan to have a responsible adult care for you for the time you are told after you leave the hospital or clinic. This is important. What happens during the procedure?  An IV will be put into one of your veins. You may be given: A sedative. This medicine helps you relax. A medicine  to numb the back of your throat. This may be sprayed or gargled. Your blood pressure, heart rate, and breathing will be watched. You may be asked to lie on your left side. A bite block will be placed in your mouth. This keeps you from biting the tube. The tip of the probe will be placed into the back of your mouth. You will be asked to swallow. Your doctor will take pictures of your heart. The probe and bite block will be taken out after the test is done. The procedure may vary among doctors and hospitals. What can I expect after the procedure? You will be monitored until you leave the hospital or clinic. This includes checking your blood pressure, heart rate, breathing rate, and blood oxygen level. Your throat may feel sore and numb. This will get better over time. You will not be allowed to eat or drink until the numbness has gone away. It is common to have a sore throat for a day or two. It is up to you to get the results of your procedure. Ask how to get your results when they are ready. Follow these instructions at home: If you were given a sedative during your procedure, do not drive or use machines until your doctor says that it is safe. Return to your normal activities when your doctor says that it is safe. Keep all follow-up visits. Summary TEE is a test that uses sound waves to take pictures of your heart. You will be given a medicine to help you relax. Do not drive or use machines until your doctor says that it  is safe. This information is not intended to replace advice given to you by your health care provider. Make sure you discuss any questions you have with your health care provider. Document Revised: 07/30/2021 Document Reviewed: 07/09/2020 Elsevier Patient Education  2024 [employer].

## 2023-09-08 ENCOUNTER — Encounter (HOSPITAL_COMMUNITY): Payer: Self-pay | Admitting: Cardiology

## 2023-09-22 ENCOUNTER — Encounter (HOSPITAL_COMMUNITY): Payer: Self-pay | Admitting: Cardiology

## 2023-09-22 ENCOUNTER — Other Ambulatory Visit (HOSPITAL_COMMUNITY): Payer: Self-pay

## 2023-09-22 ENCOUNTER — Ambulatory Visit (HOSPITAL_COMMUNITY)
Admission: RE | Admit: 2023-09-22 | Discharge: 2023-09-22 | Disposition: A | Discharge status: Home / Self Care | Payer: PPO | Source: Ambulatory Visit | Attending: Cardiology | Admitting: Cardiology

## 2023-09-22 VITALS — BP 100/60 | HR 66 | Wt 180.8 lb

## 2023-09-22 DIAGNOSIS — I5022 Chronic systolic (congestive) heart failure: Secondary | ICD-10-CM

## 2023-09-22 DIAGNOSIS — E0520 Thyrotoxicosis with toxic multinodular goiter without thyrotoxic crisis or storm: Secondary | ICD-10-CM | POA: Insufficient documentation

## 2023-09-22 DIAGNOSIS — I493 Ventricular premature depolarization: Secondary | ICD-10-CM | POA: Insufficient documentation

## 2023-09-22 DIAGNOSIS — I130 Hypertensive heart and chronic kidney disease with heart failure and stage 1 through stage 4 chronic kidney disease, or unspecified chronic kidney disease: Secondary | ICD-10-CM | POA: Insufficient documentation

## 2023-09-22 DIAGNOSIS — Z01810 Encounter for preprocedural cardiovascular examination: Secondary | ICD-10-CM | POA: Insufficient documentation

## 2023-09-22 DIAGNOSIS — N183 Chronic kidney disease, stage 3 unspecified: Secondary | ICD-10-CM | POA: Insufficient documentation

## 2023-09-22 DIAGNOSIS — I272 Pulmonary hypertension, unspecified: Secondary | ICD-10-CM | POA: Diagnosis not present

## 2023-09-22 DIAGNOSIS — I340 Nonrheumatic mitral (valve) insufficiency: Secondary | ICD-10-CM

## 2023-09-22 DIAGNOSIS — I052 Rheumatic mitral stenosis with insufficiency: Secondary | ICD-10-CM | POA: Insufficient documentation

## 2023-09-22 DIAGNOSIS — R002 Palpitations: Secondary | ICD-10-CM | POA: Diagnosis not present

## 2023-09-22 DIAGNOSIS — I428 Other cardiomyopathies: Secondary | ICD-10-CM | POA: Diagnosis not present

## 2023-09-22 DIAGNOSIS — Z7984 Long term (current) use of oral hypoglycemic drugs: Secondary | ICD-10-CM | POA: Diagnosis not present

## 2023-09-22 DIAGNOSIS — Z79899 Other long term (current) drug therapy: Secondary | ICD-10-CM | POA: Insufficient documentation

## 2023-09-22 LAB — BASIC METABOLIC PANEL
Anion gap: 7 (ref 5–15)
BUN: 44 mg/dL — ABNORMAL HIGH (ref 8–23)
CO2: 28 mmol/L (ref 22–32)
Calcium: 9.4 mg/dL (ref 8.9–10.3)
Chloride: 105 mmol/L (ref 98–111)
Creatinine, Ser: 1.78 mg/dL — ABNORMAL HIGH (ref 0.44–1.00)
GFR, Estimated: 30 mL/min — ABNORMAL LOW (ref >60)
Glucose, Bld: 113 mg/dL — ABNORMAL HIGH (ref 70–99)
Potassium: 4 mmol/L (ref 3.5–5.1)
Sodium: 140 mmol/L (ref 135–145)

## 2023-09-22 LAB — BRAIN NATRIURETIC PEPTIDE: B Natriuretic Peptide: 926 pg/mL — ABNORMAL HIGH (ref 0.0–100.0)

## 2023-09-22 NOTE — H&P (View-Only) (Signed)
ID:  Vanessa [name], DOB 09/30/1951, MRN [id number]   Provider location: [facility] Type of Visit: Established patient   PCP:  Milus Height, PA  HF Cardiologist: Dr. Shirlee Latch   HPI: Vanessa [name] is a 72 y.o. with history of nonischemic cardiomyopathy and presents for followup of CHF. Patient has had a low EF recognized since at least 9/14, when echo showed EF 40-45%.  In 06/26/2023, her child died, which led to significant stress.  She developed worsening exertional dyspnea and subsequent echo showed a fall in EF to 25-30%.  She had left and right heart catheterization in 3/16 with no significant coronary disease detected and moderately elevated right and left heart filling pressures along with pulmonary venous hypertension.  Echo was done 12/16: EF 45%, diffuse hypokinesis. She had a repeat echo 11/17 => EF 45% with moderately dilated LV, moderate MR.  Echo 11/18 showed EF stable at 45% with mild LV dilation and mildly decreased RV systolic function, moderate MR.   Spironolactone was stopped due to elevated K. She apparently did not tolerate Veltassa well.   Echo was done in 11/19.  EF 30-35% with mild LV hypertrophy, mild-moderately decreased RV systolic function, possible severe MR with restricted posterior leaflet, PASP 50 mmHg, severe LAE.  TEE was done in 11/19 to assess mitral regurgitation.  This showed severe MR with restricted posterior leaflet.   In 1/20, she had Mitraclip with reduction in MR to 1+.    She had echo in 9/20 with EF 30-35%, severe LAE, normal RV, s/p Mitraclip with mild-moderate MR and mild MS.  The IVC was dilated, suggesting volume overload.   She was unable to tolerate Bidil due to lightheadedness.   Echo in 1/21 showed EF 40-45%, RV normal, biatrial dilation, mild mitral stenosis mean 5 mmHg, mild-moderate MR (s/p Mitraclip).  Echo in 1/22 showed EF 40-45% with moderate LV dilation, normal RV size and systolic function, severe LAE,  s/p Mitraclip with probably mild mitral stenosis (mean gradient 7, MVA 2.2 cm^2 by PHT), moderate MR.   Echo in 3/23 showed EF 40-45%, moderate LV dilation, normal RV, normal IVC, s/p MV repair with Mitraclip with mild mitral stenosis mean gradient 7 mmHg, moderate MR.   Patient had labs done in 8/23 showing K up to 6.1 with AKI/creatinine 2.88.  She was admitted and K was treated.  Entresto, spironolactone, Coreg, and Veltassa were stopped.  Torsemide was decreased to 10 mg daily.   Echo (9/23) showed EF stable 40-45%, mild MS (mean gradient 7 mmHg) and mild to moderate MR s/p Mitraclip.   Echo in 9/24 showed EF 40-45%, mild LV dilation and mild LVH, mildly decreased RV systolic function, PASP 47 mmHg, s/p Mitraclip with mean gradient 6 mmHg, suspect severe mitral regurgitation, IVC normal, small secundum ASD.  TEE in 10/24 showed EF 40-45%, mild LV dilation, moderate RV dysfunction with mild RV enlargement, severe LAE, 2 Mitraclips in place with mean gradient 4 mmHg and severe eccentric MR from between the 2 clips with ERO 0.54 cm^2 and peak RV-RA gradient 63 mmHg.  I reviewed the TEE with structural heart colleagues, and there does not appear to be room between the 2 existing Mitraclips to place a 3rd clip.  Today she returns for HF follow up. Weight is up 4 lbs.  She has had episodes of transient left arm "squeezing" since starting Verquvo.  Episodes last a few minutes and are not exertional.  She still  works part time at a daycare center but overall is not very active.  She is short of breath walking up stairs and with doing heavier housework, no problems walking on flat ground.  No chest pain. No orthopnea/PND.  No lightheadedness.   ECG (personally reviewed): NSR, PVC, LVH with repolarization abnormality  Labs (7/16): K 4.5, creatinine 1.12, BNP 596 Labs (8/16): K 4 => 5.1, creatinine 1.29 => 1.5, BNP 388 => 426 Labs (9/16): K 4, creatinine 1.08, BNP 502 Labs (12/16): K 4.9, creatinine  1.57 Labs (5/17): K 5.2, creatinine 1.3 Labs (8/17): BNP 152 Labs (11/17): K 4.9, creatinine 1.32, BNP 236 Labs (8/18): K 4.6, creatinine 1.57 Labs (11/18): K 3.8, creatinine 1.29 Labs (2/19): K 5.1, creatinine 1.44 Labs (9/19): K 3.8, creatinine 1.08 Labs (11/19): K 4.3, creatinine 1.3 Labs (1/20): K 4.8, creatinine 1.68 Labs (3/20): K 4.4, creatinine 1.5 Labs (9/20): K 4.4, creatinine 1.77 => 1.48, LDL 109 Labs (10/20): K 4.6, creatinine 1.66 Labs (12/20): K 4.7, creatinine 1.59 Labs (3/21): K 4.5, creatinine 1.73 Labs (6/21): K 4.6, creatinine 1.7 Labs (10/21): TSH normal, K 4.2, creatinine 1.37 Labs (3/22): K 4.7, creatinine 1.44 Labs (5/22): K 4.2, creatinine 1.75 Labs (9/22): K 4.5, creatinine 2.12 => 1.81 Labs (12/22): K 4, creatinine 1.31 Labs (3/23): K 4.2, creatinine 1.75, BNP 1276 Labs (6/23): TSH normal Labs (8/23): K 6.1 => 5.2, creatinine 1.95 => 2.88 => 2.21, BUN 120 => 111 Labs (10/23): K 4.6, creatinine 1.66 Labs (12/23): K 4.5, creatinine 2.43 Labs (2/24): K 4, creatinine 1.7, BNP 775 Labs (8/24): SPEP negative, K 5.4, creatinine 1.58, TSH normal Labs (9/24): BNP 781, K 4.2, creatinine 1.66  PMH: 1. Cardiomyopathy: Nonischemic.  Echo (9/14) with EF 40-45%.  Echo (2/16) with EF 25-30%.  Echo (4/16) with EF 30-35%, moderate MR.  Echo (6/16) with EF 40%, moderate LV dilation, moderate diastolic dysfunction, moderate MR, moderate TR, PA systolic pressure 52 mmHg. RHC/LHC (3/16) with no significant CAD, EF 20-25%, 3+ MR; mean RA 13, PA 70/30 mean 45, mean PCWP 31 mmHg, CI 2.2, PVR 3.1 WU.  Echo (12/16) with EF 45%, diffuse hypokinesis, moderate MR, normal RV size and systolic function.  - Echo (11/17): EF 45%, moderately dilated LV, moderate eccentric posterior MR, PASP 42 mmHg, normal RV size and systolic function.  - Echo (11/18): EF 45%, mild LV dilation, moderate diastolic dysfunction, normal RV size with mildly decreased systolic function, PASP 50 mmHg, moderate MR.   - Echo (11/19): EF 30-35%, mild LV dilation, severe LAE, mild-moderately decreased RV systolic function, possible severe MR with restricted posterior leaflet, PASP 50 mmHg.  - Echo (1/20): EF 30-35%, severe LV dilation, s/p Mitraclip with mild MR and mild MS, PASP 48 mmHg.  - RHC (1/20, with Mitraclip): mean RA 10, mean LA 18 - Echo (2/20): EF 30-35%, s/p mitraclip with mild mitral stenosis, mean MV gradient 5 mmHg, moderate MR, PASP 56 mmHg.  - Echo (9/20): EF 30-35%, severe LAE, normal RV, s/p Mitraclip with mild-moderate MR and mild MS.  The IVC was dilated, suggesting volume overload.  - Echo (1/21): EF 40-45%, RV normal, biatrial dilation, mild mitral stenosis mean 5 mmHg, mild-moderate MR (s/p Mitraclip).  - Echo (1/22): EF 40-45% with moderate LV dilation, normal RV size and systolic function, severe LAE, s/p Mitraclip with probably mild mitral stenosis (mean gradient 7, MVA 2.2 cm^2 by PHT), moderate MR. - Echo (3/23): EF 40-45%, moderate LV dilation, normal RV, normal IVC, s/p MV repair with Mitraclip with mild  mitral stenosis mean gradient 7 mmHg, moderate MR.  - Echo (9/23): EF 40-45%, grade I DD, normal RV, s/p MC repair with Mitraclip with mild mitral stenosis mean gradient 7 mmHg, moderate MR. - Echo (9/24): EF 40-45%, mild LV dilation and mild LVH, mildly decreased RV systolic function, PASP 47 mmHg, s/p Mitraclip with mean gradient 6 mmHg, suspect severe mitral regurgitation, IVC normal, small secundum ASD.  - TEE (10/24): EF 40-45%, mild LV dilation, moderate RV dysfunction with mild RV enlargement, severe LAE, 2 Mitraclips in place with mean gradient 4 mmHg and severe eccentric MR from between the 2 clips with ERO 0.54 cm^2 and peak RV-RA gradient 63 mmHg. 2. PVCs: Holter (10/15) with 12% PVCs. 3. Pulmonary venous hypertension: CTA chest (7/16) negative for PE. PFTs (7/16) with minimal obstruction, mild restriction, moderately decreased DLCO.  4. Thyroid nodules: Biopsy benign.  5.  CKD stage 3: Episodes of AKI/hyperkalemia in 8/23.  6. Mitral regurgitation: TEE (11/19) with EF 30-35%, moderate LV dilation, severe MR with posterior leaflet restriction and ERO 0.41 cm^2, no mitral stenosis, mild to moderately decreased RV systolic function, peak RV-RA gradient 50 mmHg.  - Mitraclip placement x 2 in 1/20 with 1+ residual MR.  - Echo (2/20) with moderate residual MR, mild MS with mean gradient 5 mmHg.  - Echo (9/20) with mild-moderate MR, mild MS.  - Echo (1/21) with mild-moderate MR, mild MS s/p Mitraclip - Echo (1/22) with moderate residual MR, mild-moderate mitral stenosis.  - Echo (3/23) with moderate MR, mild MS s/p Mitraclip.  - Echo (9/23) with mild-moderate MR, mild MS mean gradient 7 mmHg - Echo (9/24) with severe MR, mild MS mean gradient 6 mmHg post-Mitraclip.  - TEE (10/24): with 2 Mitraclips in place with mean gradient 4 mmHg and severe eccentric MR from between the 2 clips with ERO 0.54 cm^2. 7. Hyperthyroism: Toxic multinodular goiter.   SH: Lives alone, only child passed away in 06-15-2023, nonsmoker, no ETOH or drugs.   FH: Father with MIs  ROS: All systems reviewed and negative except as per HPI.   Current Outpatient Medications  Medication Sig Dispense Refill   acetaminophen (TYLENOL) 500 MG tablet Take 500-1,000 mg by mouth every 6 (six) hours as needed (pain.).     aspirin 81 MG EC tablet Take 1 tablet (81 mg total) by mouth daily.     Calcium Carb-Cholecalciferol (CALCIUM + VITAMIN D3 PO) Take 1 tablet by mouth in the morning.     carvedilol (COREG) 6.25 MG tablet Take 1 tablet by mouth twice daily 180 tablet 0   empagliflozin (JARDIANCE) 10 MG TABS tablet TAKE 1 TABLET BY MOUTH ONCE DAILY BEFORE BREAKFAST 90 tablet 3   ibandronate (BONIVA) 150 MG tablet Take 150 mg by mouth every 30 (thirty) days.     torsemide (DEMADEX) 20 MG tablet TAKE 2 TABLETS(40 MG) ALTERNATING WITH 1 TABLET (20 MG). (2 TABS ONCE DAILY, 1 TAB THE NEXT DAY). 90 tablet 0    Vericiguat (VERQUVO) 2.5 MG TABS Take 1 tablet (2.5 mg total) by mouth daily. 30 tablet 11   No current facility-administered medications for this encounter.   BP 100/60   Pulse 66   Wt 82 kg (180 lb 12.8 oz)   SpO2 98%   BMI 32.03 kg/m    Wt Readings from Last 3 Encounters:  09/22/23 82 kg (180 lb 12.8 oz)  09/07/23 80.7 kg (178 lb)  08/26/23 80.1 kg (176 lb 9.6 oz)   General: NAD Neck: JVP 8  cm, goiter.  Lungs: Clear to auscultation bilaterally with normal respiratory effort. CV: Nondisplaced PMI.  Heart regular S1/S2, no S3/S4, 3/6 HSM apex.  No peripheral edema.  No carotid bruit.  Normal pedal pulses.  Abdomen: Soft, nontender, no hepatosplenomegaly, no distention.  Skin: Intact without lesions or rashes.  Neurologic: Alert and oriented x 3.  Psych: Normal affect. Extremities: No clubbing or cyanosis.  HEENT: Normal.   Assessment/Plan: 1. Chronic systolic CHF: Nonischemic cardiomyopathy.  EF up to 45% on 12/16, repeat echo 11/18 shows that EF remains about 45%.  Etiology of NICM still unclear. Had frequent PVCs by past holter (12%) but doesn't explain her cardiomyopathy. Had improvement of palpitations on Coreg. Cannot rule out myocarditis. May also have component of Takotsubo with passing of child but EF has not improved as completely as would be expected with Takotsubo. Have previously discussed cardiac MRI to look for infiltrative disease but she says that she would be too claustrophobic.  Echo in 11/19 showed EF down to 30-35% and mild to moderate RV systolic function.  There also appeared to be severe mitral regurgitation, which seemed progressive from the past. TEE in 11/19 confirmed severe MR with restricted posterior leaflet => Mitraclip 1/20 with decrease in MR to 1+.  Echo in 1/20 showed EF 30-35% with mild MR s/p Mitraclip. Echo in 9/20 with stable EF 30-35% but dilated IVC suggestive of volume overload.  Echo in 1/22 with EF 40-45%, normal RV.  Echo in 3/23 showed  EF  40-45%, moderate LV dilation, normal RV, normal IVC, s/p MV repair with Mitraclip with mild mitral stenosis mean gradient 7 mmHg, moderate MR. Echo (9/23) showed EF stable 40-45%, mild MS (mean gradient 7 mmHg) and mild to moderate MR s/p Mitraclip.  Echo in 9/24 showed EF 40-45%, mild LV dilation and mild LVH, mildly decreased RV systolic function, PASP 47 mmHg, s/p Mitraclip with mean gradient 6 mmHg, suspect severe mitral regurgitation, IVC normal, small secundum ASD.  Mitral regurgitation has worsened, complicating CHF management.  GDMT has been limited by soft BP and CKD. She is probably mildly volume overloaded on exam today.  - Continue torsemide 20 mg daily alternating with 40 mg daily. BMET/BNP today.  - Continue Coreg 6.25 mg bid. - Continue empagliflozin 10 mg daily.    - She will stay off spironolactone and Entresto for now with elevated creatinine and history of hyperkalemia.  Of note, she did not tolerate Bidil in the past.    - Continue Verquvo 2.5 mg daily, I will not increase this as she thinks it causes arm pain.   - EF now above ICD range.   2. PVCs: Frequent (12%) on 10/15 holter.   Pt refused repeat holter monitor for further assessment. PVC count was probably not high enough to cause cardiomyopathy, unlikely primary etiology.  No palpitations currently.  - Continue Coreg. 3. Pulmonary hypertension: Primarily pulmonary venous HTN (PVR 3.1) from elevated left atrial pressure (previous RHC above).   - Best treatment remains adequate diuresis. 4. Mitral regurgitation:  TEE in 11/19 with severe MR, restricted posterior leaflet.  She had Mitraclip x 2 in 1/20. Post-op echo showed mild MR and mild mitral stenosis.  Echo in 1/22 showed moderate MR, mild-moderate MS. Echo in 3/23 showed moderate MR, mild-moderate MS. Echo 9/23 showed mild-moderate MR, mild MS (mean gradient 7 mmHg).  Echo in 9/24 showed MV s/p Mitraclip x 2 with mean gradient 6 mmHg and severe mitral regurgitation. TEE was  then done to evaluate,  showing 2 Mitraclips in place with mean gradient 4 mmHg and severe eccentric MR from between the 2 clips with ERO 0.54 cm^2.  I reviewed the TEE with structural heart colleagues.  She is not thought to be a candidate for an additional Mitraclip due to lack of room between the two existing clips. - I am going to refer her to Dr. Leafy Ro for evaluation for MV replacement.  - I will arrange for [facility] for presurgical evaluation (pre-MVR).  I discussed risks/benefits with her and she agrees to procedure.  Will need to minimize contrast given CKD stage 3.  5. 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 TSH was normal in 8/24.  - Followup with endocrinology.  6. CKD stage 3 with h/o AKI in setting of hypotension: She is now off Entresto and spironolactone.  - BMET today.  - Follows with nephrology.   Followup in 2 months  Signed, Marca Ancona, MD  09/22/2023  [facility] [address] [facility] [place] [address] [phone] (office) [phone] (fax)

## 2023-09-22 NOTE — Patient Instructions (Addendum)
There has been no changes to your medications  Labs done today, your results will be available in MyChart, we will contact you for abnormal readings.   You are scheduled for a Cardiac Catheterization on Tuesday, November 12 with Dr. Marca Ancona.  1. Please arrive at the [facility] (Main Entrance A) at [facility]: [address] [place], [place] [address] at 5:30 AM (This time is 2 hour(s) before your procedure to ensure your preparation). Free valet parking service is available. You will check in at ADMITTING. The support person will be asked to wait in the waiting room.  It is OK to have someone drop you off and come back when you are ready to be discharged.    Special note: Every effort is made to have your procedure done on time. Please understand that emergencies sometimes delay scheduled procedures.  2. Diet: Do not eat solid foods after midnight.  The patient may have clear liquids until 5am upon the day of the procedure.  3.  Medication instructions in preparation for your procedure:   Contrast Allergy: No  HOLD Torsemide before the procedure and day of procedure  HOLD Jardiance the morning of your procedure   On the morning of your procedure, take your Aspirin 81 mg and any morning medicines NOT listed above.  You may use sips of water.  5. Plan to go home the same day, you will only stay overnight if medically necessary. 6. Bring a current list of your medications and current insurance cards. 7. You MUST have a responsible person to drive you home. 8. Someone MUST be with you the first 24 hours after you arrive home or your discharge will be delayed. 9. Please wear clothes that are easy to get on and off and wear slip-on shoes.  Your physician recommends that you schedule a follow-up appointment in: as scheduled.   If you have any questions or concerns before your next appointment please send [place] a message through [place] or call our office at [phone].    TO  LEAVE A MESSAGE FOR THE NURSE SELECT OPTION 2, PLEASE LEAVE A MESSAGE INCLUDING: YOUR NAME DATE OF BIRTH CALL BACK NUMBER REASON FOR CALL**this is important as we prioritize the call backs  YOU WILL RECEIVE A CALL BACK THE SAME DAY AS LONG AS YOU CALL BEFORE 4:00 PM  At the [facility], you and your health needs are our priority. As part of our continuing mission to provide you with exceptional heart care, we have created designated Provider Care Teams. These Care Teams include your primary Cardiologist (physician) and Advanced Practice Providers (APPs- Physician Assistants and Nurse Practitioners) who all work together to provide you with the care you need, when you need it.   You may see any of the following providers on your designated Care Team at your next follow up: Dr Arvilla Meres Dr Marca Ancona Dr. Dorthula Nettles Dr. Clearnce Hasten Amy Filbert Schilder, NP Robbie Lis, [place] [facility] [place], [place] Brynda Peon, NP [place] Lee, NP Karle Plumber, PharmD   Please be sure to bring in all your medications bottles to every appointment.    Thank you for choosing [facility]

## 2023-09-22 NOTE — Progress Notes (Signed)
ID:  Vanessa [name], DOB 09/30/1951, MRN [id number]   Provider location: [facility] Type of Visit: Established patient   PCP:  Milus Height, PA  HF Cardiologist: Dr. Shirlee Latch   HPI: Vanessa [name] is a 72 y.o. with history of nonischemic cardiomyopathy and presents for followup of CHF. Patient has had a low EF recognized since at least 9/14, when echo showed EF 40-45%.  In 06/26/2023, her child died, which led to significant stress.  She developed worsening exertional dyspnea and subsequent echo showed a fall in EF to 25-30%.  She had left and right heart catheterization in 3/16 with no significant coronary disease detected and moderately elevated right and left heart filling pressures along with pulmonary venous hypertension.  Echo was done 12/16: EF 45%, diffuse hypokinesis. She had a repeat echo 11/17 => EF 45% with moderately dilated LV, moderate MR.  Echo 11/18 showed EF stable at 45% with mild LV dilation and mildly decreased RV systolic function, moderate MR.   Spironolactone was stopped due to elevated K. She apparently did not tolerate Veltassa well.   Echo was done in 11/19.  EF 30-35% with mild LV hypertrophy, mild-moderately decreased RV systolic function, possible severe MR with restricted posterior leaflet, PASP 50 mmHg, severe LAE.  TEE was done in 11/19 to assess mitral regurgitation.  This showed severe MR with restricted posterior leaflet.   In 1/20, she had Mitraclip with reduction in MR to 1+.    She had echo in 9/20 with EF 30-35%, severe LAE, normal RV, s/p Mitraclip with mild-moderate MR and mild MS.  The IVC was dilated, suggesting volume overload.   She was unable to tolerate Bidil due to lightheadedness.   Echo in 1/21 showed EF 40-45%, RV normal, biatrial dilation, mild mitral stenosis mean 5 mmHg, mild-moderate MR (s/p Mitraclip).  Echo in 1/22 showed EF 40-45% with moderate LV dilation, normal RV size and systolic function, severe LAE,  s/p Mitraclip with probably mild mitral stenosis (mean gradient 7, MVA 2.2 cm^2 by PHT), moderate MR.   Echo in 3/23 showed EF 40-45%, moderate LV dilation, normal RV, normal IVC, s/p MV repair with Mitraclip with mild mitral stenosis mean gradient 7 mmHg, moderate MR.   Patient had labs done in 8/23 showing K up to 6.1 with AKI/creatinine 2.88.  She was admitted and K was treated.  Entresto, spironolactone, Coreg, and Veltassa were stopped.  Torsemide was decreased to 10 mg daily.   Echo (9/23) showed EF stable 40-45%, mild MS (mean gradient 7 mmHg) and mild to moderate MR s/p Mitraclip.   Echo in 9/24 showed EF 40-45%, mild LV dilation and mild LVH, mildly decreased RV systolic function, PASP 47 mmHg, s/p Mitraclip with mean gradient 6 mmHg, suspect severe mitral regurgitation, IVC normal, small secundum ASD.  TEE in 10/24 showed EF 40-45%, mild LV dilation, moderate RV dysfunction with mild RV enlargement, severe LAE, 2 Mitraclips in place with mean gradient 4 mmHg and severe eccentric MR from between the 2 clips with ERO 0.54 cm^2 and peak RV-RA gradient 63 mmHg.  I reviewed the TEE with structural heart colleagues, and there does not appear to be room between the 2 existing Mitraclips to place a 3rd clip.  Today she returns for HF follow up. Weight is up 4 lbs.  She has had episodes of transient left arm "squeezing" since starting Verquvo.  Episodes last a few minutes and are not exertional.  She still  works part time at a daycare center but overall is not very active.  She is short of breath walking up stairs and with doing heavier housework, no problems walking on flat ground.  No chest pain. No orthopnea/PND.  No lightheadedness.   ECG (personally reviewed): NSR, PVC, LVH with repolarization abnormality  Labs (7/16): K 4.5, creatinine 1.12, BNP 596 Labs (8/16): K 4 => 5.1, creatinine 1.29 => 1.5, BNP 388 => 426 Labs (9/16): K 4, creatinine 1.08, BNP 502 Labs (12/16): K 4.9, creatinine  1.57 Labs (5/17): K 5.2, creatinine 1.3 Labs (8/17): BNP 152 Labs (11/17): K 4.9, creatinine 1.32, BNP 236 Labs (8/18): K 4.6, creatinine 1.57 Labs (11/18): K 3.8, creatinine 1.29 Labs (2/19): K 5.1, creatinine 1.44 Labs (9/19): K 3.8, creatinine 1.08 Labs (11/19): K 4.3, creatinine 1.3 Labs (1/20): K 4.8, creatinine 1.68 Labs (3/20): K 4.4, creatinine 1.5 Labs (9/20): K 4.4, creatinine 1.77 => 1.48, LDL 109 Labs (10/20): K 4.6, creatinine 1.66 Labs (12/20): K 4.7, creatinine 1.59 Labs (3/21): K 4.5, creatinine 1.73 Labs (6/21): K 4.6, creatinine 1.7 Labs (10/21): TSH normal, K 4.2, creatinine 1.37 Labs (3/22): K 4.7, creatinine 1.44 Labs (5/22): K 4.2, creatinine 1.75 Labs (9/22): K 4.5, creatinine 2.12 => 1.81 Labs (12/22): K 4, creatinine 1.31 Labs (3/23): K 4.2, creatinine 1.75, BNP 1276 Labs (6/23): TSH normal Labs (8/23): K 6.1 => 5.2, creatinine 1.95 => 2.88 => 2.21, BUN 120 => 111 Labs (10/23): K 4.6, creatinine 1.66 Labs (12/23): K 4.5, creatinine 2.43 Labs (2/24): K 4, creatinine 1.7, BNP 775 Labs (8/24): SPEP negative, K 5.4, creatinine 1.58, TSH normal Labs (9/24): BNP 781, K 4.2, creatinine 1.66  PMH: 1. Cardiomyopathy: Nonischemic.  Echo (9/14) with EF 40-45%.  Echo (2/16) with EF 25-30%.  Echo (4/16) with EF 30-35%, moderate MR.  Echo (6/16) with EF 40%, moderate LV dilation, moderate diastolic dysfunction, moderate MR, moderate TR, PA systolic pressure 52 mmHg. RHC/LHC (3/16) with no significant CAD, EF 20-25%, 3+ MR; mean RA 13, PA 70/30 mean 45, mean PCWP 31 mmHg, CI 2.2, PVR 3.1 WU.  Echo (12/16) with EF 45%, diffuse hypokinesis, moderate MR, normal RV size and systolic function.  - Echo (11/17): EF 45%, moderately dilated LV, moderate eccentric posterior MR, PASP 42 mmHg, normal RV size and systolic function.  - Echo (11/18): EF 45%, mild LV dilation, moderate diastolic dysfunction, normal RV size with mildly decreased systolic function, PASP 50 mmHg, moderate MR.   - Echo (11/19): EF 30-35%, mild LV dilation, severe LAE, mild-moderately decreased RV systolic function, possible severe MR with restricted posterior leaflet, PASP 50 mmHg.  - Echo (1/20): EF 30-35%, severe LV dilation, s/p Mitraclip with mild MR and mild MS, PASP 48 mmHg.  - RHC (1/20, with Mitraclip): mean RA 10, mean LA 18 - Echo (2/20): EF 30-35%, s/p mitraclip with mild mitral stenosis, mean MV gradient 5 mmHg, moderate MR, PASP 56 mmHg.  - Echo (9/20): EF 30-35%, severe LAE, normal RV, s/p Mitraclip with mild-moderate MR and mild MS.  The IVC was dilated, suggesting volume overload.  - Echo (1/21): EF 40-45%, RV normal, biatrial dilation, mild mitral stenosis mean 5 mmHg, mild-moderate MR (s/p Mitraclip).  - Echo (1/22): EF 40-45% with moderate LV dilation, normal RV size and systolic function, severe LAE, s/p Mitraclip with probably mild mitral stenosis (mean gradient 7, MVA 2.2 cm^2 by PHT), moderate MR. - Echo (3/23): EF 40-45%, moderate LV dilation, normal RV, normal IVC, s/p MV repair with Mitraclip with mild  mitral stenosis mean gradient 7 mmHg, moderate MR.  - Echo (9/23): EF 40-45%, grade I DD, normal RV, s/p MC repair with Mitraclip with mild mitral stenosis mean gradient 7 mmHg, moderate MR. - Echo (9/24): EF 40-45%, mild LV dilation and mild LVH, mildly decreased RV systolic function, PASP 47 mmHg, s/p Mitraclip with mean gradient 6 mmHg, suspect severe mitral regurgitation, IVC normal, small secundum ASD.  - TEE (10/24): EF 40-45%, mild LV dilation, moderate RV dysfunction with mild RV enlargement, severe LAE, 2 Mitraclips in place with mean gradient 4 mmHg and severe eccentric MR from between the 2 clips with ERO 0.54 cm^2 and peak RV-RA gradient 63 mmHg. 2. PVCs: Holter (10/15) with 12% PVCs. 3. Pulmonary venous hypertension: CTA chest (7/16) negative for PE. PFTs (7/16) with minimal obstruction, mild restriction, moderately decreased DLCO.  4. Thyroid nodules: Biopsy benign.  5.  CKD stage 3: Episodes of AKI/hyperkalemia in 8/23.  6. Mitral regurgitation: TEE (11/19) with EF 30-35%, moderate LV dilation, severe MR with posterior leaflet restriction and ERO 0.41 cm^2, no mitral stenosis, mild to moderately decreased RV systolic function, peak RV-RA gradient 50 mmHg.  - Mitraclip placement x 2 in 1/20 with 1+ residual MR.  - Echo (2/20) with moderate residual MR, mild MS with mean gradient 5 mmHg.  - Echo (9/20) with mild-moderate MR, mild MS.  - Echo (1/21) with mild-moderate MR, mild MS s/p Mitraclip - Echo (1/22) with moderate residual MR, mild-moderate mitral stenosis.  - Echo (3/23) with moderate MR, mild MS s/p Mitraclip.  - Echo (9/23) with mild-moderate MR, mild MS mean gradient 7 mmHg - Echo (9/24) with severe MR, mild MS mean gradient 6 mmHg post-Mitraclip.  - TEE (10/24): with 2 Mitraclips in place with mean gradient 4 mmHg and severe eccentric MR from between the 2 clips with ERO 0.54 cm^2. 7. Hyperthyroism: Toxic multinodular goiter.   SH: Lives alone, only child passed away in 06-15-2023, nonsmoker, no ETOH or drugs.   FH: Father with MIs  ROS: All systems reviewed and negative except as per HPI.   Current Outpatient Medications  Medication Sig Dispense Refill   acetaminophen (TYLENOL) 500 MG tablet Take 500-1,000 mg by mouth every 6 (six) hours as needed (pain.).     aspirin 81 MG EC tablet Take 1 tablet (81 mg total) by mouth daily.     Calcium Carb-Cholecalciferol (CALCIUM + VITAMIN D3 PO) Take 1 tablet by mouth in the morning.     carvedilol (COREG) 6.25 MG tablet Take 1 tablet by mouth twice daily 180 tablet 0   empagliflozin (JARDIANCE) 10 MG TABS tablet TAKE 1 TABLET BY MOUTH ONCE DAILY BEFORE BREAKFAST 90 tablet 3   ibandronate (BONIVA) 150 MG tablet Take 150 mg by mouth every 30 (thirty) days.     torsemide (DEMADEX) 20 MG tablet TAKE 2 TABLETS(40 MG) ALTERNATING WITH 1 TABLET (20 MG). (2 TABS ONCE DAILY, 1 TAB THE NEXT DAY). 90 tablet 0    Vericiguat (VERQUVO) 2.5 MG TABS Take 1 tablet (2.5 mg total) by mouth daily. 30 tablet 11   No current facility-administered medications for this encounter.   BP 100/60   Pulse 66   Wt 82 kg (180 lb 12.8 oz)   SpO2 98%   BMI 32.03 kg/m    Wt Readings from Last 3 Encounters:  09/22/23 82 kg (180 lb 12.8 oz)  09/07/23 80.7 kg (178 lb)  08/26/23 80.1 kg (176 lb 9.6 oz)   General: NAD Neck: JVP 8  cm, goiter.  Lungs: Clear to auscultation bilaterally with normal respiratory effort. CV: Nondisplaced PMI.  Heart regular S1/S2, no S3/S4, 3/6 HSM apex.  No peripheral edema.  No carotid bruit.  Normal pedal pulses.  Abdomen: Soft, nontender, no hepatosplenomegaly, no distention.  Skin: Intact without lesions or rashes.  Neurologic: Alert and oriented x 3.  Psych: Normal affect. Extremities: No clubbing or cyanosis.  HEENT: Normal.   Assessment/Plan: 1. Chronic systolic CHF: Nonischemic cardiomyopathy.  EF up to 45% on 12/16, repeat echo 11/18 shows that EF remains about 45%.  Etiology of NICM still unclear. Had frequent PVCs by past holter (12%) but doesn't explain her cardiomyopathy. Had improvement of palpitations on Coreg. Cannot rule out myocarditis. May also have component of Takotsubo with passing of child but EF has not improved as completely as would be expected with Takotsubo. Have previously discussed cardiac MRI to look for infiltrative disease but she says that she would be too claustrophobic.  Echo in 11/19 showed EF down to 30-35% and mild to moderate RV systolic function.  There also appeared to be severe mitral regurgitation, which seemed progressive from the past. TEE in 11/19 confirmed severe MR with restricted posterior leaflet => Mitraclip 1/20 with decrease in MR to 1+.  Echo in 1/20 showed EF 30-35% with mild MR s/p Mitraclip. Echo in 9/20 with stable EF 30-35% but dilated IVC suggestive of volume overload.  Echo in 1/22 with EF 40-45%, normal RV.  Echo in 3/23 showed  EF  40-45%, moderate LV dilation, normal RV, normal IVC, s/p MV repair with Mitraclip with mild mitral stenosis mean gradient 7 mmHg, moderate MR. Echo (9/23) showed EF stable 40-45%, mild MS (mean gradient 7 mmHg) and mild to moderate MR s/p Mitraclip.  Echo in 9/24 showed EF 40-45%, mild LV dilation and mild LVH, mildly decreased RV systolic function, PASP 47 mmHg, s/p Mitraclip with mean gradient 6 mmHg, suspect severe mitral regurgitation, IVC normal, small secundum ASD.  Mitral regurgitation has worsened, complicating CHF management.  GDMT has been limited by soft BP and CKD. She is probably mildly volume overloaded on exam today.  - Continue torsemide 20 mg daily alternating with 40 mg daily. BMET/BNP today.  - Continue Coreg 6.25 mg bid. - Continue empagliflozin 10 mg daily.    - She will stay off spironolactone and Entresto for now with elevated creatinine and history of hyperkalemia.  Of note, she did not tolerate Bidil in the past.    - Continue Verquvo 2.5 mg daily, I will not increase this as she thinks it causes arm pain.   - EF now above ICD range.   2. PVCs: Frequent (12%) on 10/15 holter.   Pt refused repeat holter monitor for further assessment. PVC count was probably not high enough to cause cardiomyopathy, unlikely primary etiology.  No palpitations currently.  - Continue Coreg. 3. Pulmonary hypertension: Primarily pulmonary venous HTN (PVR 3.1) from elevated left atrial pressure (previous RHC above).   - Best treatment remains adequate diuresis. 4. Mitral regurgitation:  TEE in 11/19 with severe MR, restricted posterior leaflet.  She had Mitraclip x 2 in 1/20. Post-op echo showed mild MR and mild mitral stenosis.  Echo in 1/22 showed moderate MR, mild-moderate MS. Echo in 3/23 showed moderate MR, mild-moderate MS. Echo 9/23 showed mild-moderate MR, mild MS (mean gradient 7 mmHg).  Echo in 9/24 showed MV s/p Mitraclip x 2 with mean gradient 6 mmHg and severe mitral regurgitation. TEE was  then done to evaluate,  showing 2 Mitraclips in place with mean gradient 4 mmHg and severe eccentric MR from between the 2 clips with ERO 0.54 cm^2.  I reviewed the TEE with structural heart colleagues.  She is not thought to be a candidate for an additional Mitraclip due to lack of room between the two existing clips. - I am going to refer her to Dr. Leafy Ro for evaluation for MV replacement.  - I will arrange for [facility] for presurgical evaluation (pre-MVR).  I discussed risks/benefits with her and she agrees to procedure.  Will need to minimize contrast given CKD stage 3.  5. 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 TSH was normal in 8/24.  - Followup with endocrinology.  6. CKD stage 3 with h/o AKI in setting of hypotension: She is now off Entresto and spironolactone.  - BMET today.  - Follows with nephrology.   Followup in 2 months  Signed, Marca Ancona, MD  09/22/2023  [facility] [address] [facility] [place] [address] [phone] (office) [phone] (fax)

## 2023-10-12 ENCOUNTER — Encounter (HOSPITAL_COMMUNITY)
Admission: RE | Disposition: A | Discharge status: Home / Self Care | Payer: Self-pay | Source: Home / Self Care | Attending: Cardiology

## 2023-10-12 ENCOUNTER — Ambulatory Visit (HOSPITAL_COMMUNITY)
Admission: RE | Admit: 2023-10-12 | Discharge: 2023-10-12 | Disposition: A | Discharge status: Home / Self Care | Payer: PPO | Attending: Cardiology | Admitting: Cardiology

## 2023-10-12 ENCOUNTER — Encounter (HOSPITAL_COMMUNITY): Payer: Self-pay | Admitting: Cardiology

## 2023-10-12 ENCOUNTER — Other Ambulatory Visit: Payer: Self-pay

## 2023-10-12 DIAGNOSIS — I272 Pulmonary hypertension, unspecified: Secondary | ICD-10-CM | POA: Diagnosis not present

## 2023-10-12 DIAGNOSIS — I428 Other cardiomyopathies: Secondary | ICD-10-CM | POA: Diagnosis not present

## 2023-10-12 DIAGNOSIS — I340 Nonrheumatic mitral (valve) insufficiency: Secondary | ICD-10-CM | POA: Diagnosis present

## 2023-10-12 DIAGNOSIS — I5022 Chronic systolic (congestive) heart failure: Secondary | ICD-10-CM | POA: Diagnosis not present

## 2023-10-12 DIAGNOSIS — I130 Hypertensive heart and chronic kidney disease with heart failure and stage 1 through stage 4 chronic kidney disease, or unspecified chronic kidney disease: Secondary | ICD-10-CM | POA: Diagnosis not present

## 2023-10-12 HISTORY — PX: RIGHT/LEFT HEART CATH AND CORONARY ANGIOGRAPHY: CATH118266

## 2023-10-12 LAB — POCT I-STAT EG7
Acid-base deficit: 2 mmol/L (ref 0.0–2.0)
Acid-base deficit: 2 mmol/L (ref 0.0–2.0)
Bicarbonate: 23.5 mmol/L (ref 20.0–28.0)
Bicarbonate: 23.8 mmol/L (ref 20.0–28.0)
Calcium, Ion: 1.22 mmol/L (ref 1.15–1.40)
Calcium, Ion: 1.24 mmol/L (ref 1.15–1.40)
HCT: 32 % — ABNORMAL LOW (ref 36.0–46.0)
HCT: 33 % — ABNORMAL LOW (ref 36.0–46.0)
Hemoglobin: 10.9 g/dL — ABNORMAL LOW (ref 12.0–15.0)
Hemoglobin: 11.2 g/dL — ABNORMAL LOW (ref 12.0–15.0)
O2 Saturation: 61 %
O2 Saturation: 64 %
Potassium: 4.2 mmol/L (ref 3.5–5.1)
Potassium: 4.3 mmol/L (ref 3.5–5.1)
Sodium: 139 mmol/L (ref 135–145)
Sodium: 140 mmol/L (ref 135–145)
TCO2: 25 mmol/L (ref 22–32)
TCO2: 25 mmol/L (ref 22–32)
pCO2, Ven: 44.2 mm[Hg] (ref 44–60)
pCO2, Ven: 44.5 mm[Hg] (ref 44–60)
pH, Ven: 7.334 (ref 7.25–7.43)
pH, Ven: 7.337 (ref 7.25–7.43)
pO2, Ven: 34 mm[Hg] (ref 32–45)
pO2, Ven: 35 mm[Hg] (ref 32–45)

## 2023-10-12 LAB — CBC
HCT: 36.6 % (ref 36.0–46.0)
Hemoglobin: 11.4 g/dL — ABNORMAL LOW (ref 12.0–15.0)
MCH: 28.4 pg (ref 26.0–34.0)
MCHC: 31.1 g/dL (ref 30.0–36.0)
MCV: 91.3 fL (ref 80.0–100.0)
Platelets: 188 10*3/uL (ref 150–400)
RBC: 4.01 MIL/uL (ref 3.87–5.11)
RDW: 13.2 % (ref 11.5–15.5)
WBC: 5 10*3/uL (ref 4.0–10.5)
nRBC: 0 % (ref 0.0–0.2)

## 2023-10-12 SURGERY — RIGHT/LEFT HEART CATH AND CORONARY ANGIOGRAPHY
Anesthesia: LOCAL

## 2023-10-12 MED ORDER — VERAPAMIL HCL 2.5 MG/ML IV SOLN
INTRAVENOUS | Status: DC | PRN
  Administered 2023-10-12: 10 mL via INTRA_ARTERIAL

## 2023-10-12 MED ORDER — HEPARIN SODIUM (PORCINE) 1000 UNIT/ML IJ SOLN
INTRAMUSCULAR | Status: DC | PRN
  Administered 2023-10-12: 4000 [IU] via INTRAVENOUS

## 2023-10-12 MED ORDER — ACETAMINOPHEN 325 MG PO TABS: 650.0000 mg | ORAL_TABLET | ORAL | Status: DC | PRN

## 2023-10-12 MED ORDER — LABETALOL HCL 5 MG/ML IV SOLN: 10.0000 mg | INTRAVENOUS | Status: DC | PRN

## 2023-10-12 MED ORDER — FENTANYL CITRATE (PF) 100 MCG/2ML IJ SOLN
INTRAMUSCULAR | Status: DC | PRN
  Administered 2023-10-12: 25 ug via INTRAVENOUS

## 2023-10-12 MED ORDER — VERAPAMIL HCL 2.5 MG/ML IV SOLN
INTRAVENOUS | Status: AC
  Filled 2023-10-12: qty 2

## 2023-10-12 MED ORDER — LIDOCAINE HCL (PF) 1 % IJ SOLN
INTRAMUSCULAR | Status: DC | PRN
  Administered 2023-10-12 (×2): 2 mL

## 2023-10-12 MED ORDER — HEPARIN SODIUM (PORCINE) 1000 UNIT/ML IJ SOLN
INTRAMUSCULAR | Status: AC
  Filled 2023-10-12: qty 10

## 2023-10-12 MED ORDER — SODIUM CHLORIDE 0.9% FLUSH: 3.0000 mL | Freq: Two times a day (BID) | INTRAVENOUS | Status: DC

## 2023-10-12 MED ORDER — FENTANYL CITRATE (PF) 100 MCG/2ML IJ SOLN
INTRAMUSCULAR | Status: AC
  Filled 2023-10-12: qty 2

## 2023-10-12 MED ORDER — MIDAZOLAM HCL 2 MG/2ML IJ SOLN
INTRAMUSCULAR | Status: AC
  Filled 2023-10-12: qty 2

## 2023-10-12 MED ORDER — MIDAZOLAM HCL 2 MG/2ML IJ SOLN
INTRAMUSCULAR | Status: DC | PRN
  Administered 2023-10-12: 1 mg via INTRAVENOUS
  Administered 2023-10-12: .5 mg via INTRAVENOUS

## 2023-10-12 MED ORDER — SODIUM CHLORIDE 0.9 % IV SOLN: INTRAVENOUS | Status: DC

## 2023-10-12 MED ORDER — IOHEXOL 350 MG/ML SOLN
INTRAVENOUS | Status: DC | PRN
  Administered 2023-10-12: 25 mL

## 2023-10-12 MED ORDER — SODIUM CHLORIDE 0.9 % IV SOLN
INTRAVENOUS | Status: DC
Start: 2023-10-12 — End: 2023-10-12

## 2023-10-12 MED ORDER — SODIUM CHLORIDE 0.9 % IV SOLN: 250.0000 mL | INTRAVENOUS | Status: DC | PRN

## 2023-10-12 MED ORDER — LIDOCAINE HCL (PF) 1 % IJ SOLN
INTRAMUSCULAR | Status: AC
Start: 2023-10-12 — End: ?
  Filled 2023-10-12: qty 30

## 2023-10-12 MED ORDER — ONDANSETRON HCL 4 MG/2ML IJ SOLN: 4.0000 mg | Freq: Four times a day (QID) | INTRAMUSCULAR | Status: DC | PRN

## 2023-10-12 MED ORDER — HEPARIN (PORCINE) IN NACL 1000-0.9 UT/500ML-% IV SOLN
INTRAVENOUS | Status: DC | PRN
  Administered 2023-10-12 (×2): 500 mL

## 2023-10-12 MED ORDER — HYDRALAZINE HCL 20 MG/ML IJ SOLN: 10.0000 mg | INTRAMUSCULAR | Status: DC | PRN

## 2023-10-12 MED ORDER — SODIUM CHLORIDE 0.9% FLUSH: 3.0000 mL | INTRAVENOUS | Status: DC | PRN

## 2023-10-12 SURGICAL SUPPLY — 14 items
CATH 5FR JL3.5 JR4 ANG PIG MP (CATHETERS) IMPLANT
CATH BALLN WEDGE 5F 110CM (CATHETERS) IMPLANT
CATH INFINITI 5 FR 3DRC (CATHETERS) IMPLANT
DEVICE RAD COMP TR BAND LRG (VASCULAR PRODUCTS) IMPLANT
GLIDESHEATH SLEND SS 6F .021 (SHEATH) IMPLANT
GUIDEWIRE .025 260CM (WIRE) IMPLANT
GUIDEWIRE INQWIRE 1.5J.035X260 (WIRE) IMPLANT
INQWIRE 1.5J .035X260CM (WIRE) ×1
KIT HEART LEFT (KITS) IMPLANT
PACK CARDIAC CATHETERIZATION (CUSTOM PROCEDURE TRAY) ×2 IMPLANT
SHEATH GLIDE SLENDER 4/5FR (SHEATH) IMPLANT
TRANSDUCER W/STOPCOCK (MISCELLANEOUS) IMPLANT
TUBING ART PRESS 72 MALE/FEM (TUBING) IMPLANT
TUBING CIL FLEX 10 FLL-RA (TUBING) IMPLANT

## 2023-10-12 NOTE — Interval H&P Note (Signed)
History and Physical Interval Note:  10/12/2023 7:37 AM  Vanessa [name]  has presented today for surgery, with the diagnosis of pre mitral valve repair.  The various methods of treatment have been discussed with the patient and family. After consideration of risks, benefits and other options for treatment, the patient has consented to  Procedure(s): RIGHT/LEFT HEART CATH AND CORONARY ANGIOGRAPHY (N/A) as a surgical intervention.  The patient's history has been reviewed, patient examined, no change in status, stable for surgery.  I have reviewed the patient's chart and labs.  Questions were answered to the patient's satisfaction.     Damichael Hofman [employer]

## 2023-10-12 NOTE — Discharge Instructions (Signed)
Radial Site Care  This sheet gives you information about how to care for yourself after your procedure. Your health care provider may also give you more specific instructions. If you have problems or questions, contact your health care provider. What can I expect after the procedure? After the procedure, it is common to have: Bruising and tenderness at the catheter insertion area. Follow these instructions at home: Medicines Take over-the-counter and prescription medicines only as told by your health care provider. Insertion site care Follow instructions from your health care provider about how to take care of your insertion site. Make sure you: Wash your hands with soap and water before you remove your bandage (dressing). If soap and water are not available, use hand sanitizer. May remove dressing in 24 hours. Check your insertion site every day for signs of infection. Check for: Redness, swelling, or pain. Fluid or blood. Pus or a bad smell. Warmth. Do no take baths, swim, or use a hot tub for 5 days. You may shower 24-48 hours after the procedure. Remove the dressing and gently wash the site with plain soap and water. Pat the area dry with a clean towel. Do not rub the site. That could cause bleeding. Do not apply powder or lotion to the site. Activity  For 24 hours after the procedure, or as directed by your health care provider: Do not flex or bend the affected arm. Do not push or pull heavy objects with the affected arm. Do not drive yourself home from the hospital or clinic. You may drive 24 hours after the procedure. Do not operate machinery or power tools. KEEP ARM ELEVATED THE REMAINDER OF THE DAY. Do not push, pull or lift anything that is heavier than 10 lb for 5 days. Ask your health care provider when it is okay to: Return to work or school. Resume usual physical activities or sports. Resume sexual activity. General instructions If the catheter site starts to  bleed, raise your arm and put firm pressure on the site. If the bleeding does not stop, get help right away. This is a medical emergency. DRINK PLENTY OF FLUIDS FOR THE NEXT 2-3 DAYS. No alcohol consumption for 24 hours after receiving sedation. If you went home on the same day as your procedure, a responsible adult should be with you for the first 24 hours after you arrive home. Keep all follow-up visits as told by your health care provider. This is important. Contact a health care provider if: You have a fever. You have redness, swelling, or yellow drainage around your insertion site. Get help right away if: You have unusual pain at the radial site. The catheter insertion area swells very fast. The insertion area is bleeding, and the bleeding does not stop when you hold steady pressure on the area. Your arm or hand becomes pale, cool, tingly, or numb. These symptoms may represent a serious problem that is an emergency. Do not wait to see if the symptoms will go away. Get medical help right away. Call your local emergency services (911 in the [place] Do not drive yourself to the hospital. Summary After the procedure, it is common to have bruising and tenderness at the site. Follow instructions from your health care provider about how to take care of your radial site wound. Check the wound every day for signs of infection.  This information is not intended to replace advice given to you by your health care provider. Make sure you discuss any questions you have with   your health care provider. Document Revised: 12/22/2017 Document Reviewed: 12/22/2017 Elsevier Patient Education  2020 [employer]   Brachial Site Care   This sheet gives you information about how to care for yourself after your procedure. Your health care provider may also give you more specific instructions. If you have problems or questions, contact your health care provider. What can I expect after the procedure? After the  procedure, it is common to have: Bruising and tenderness at the catheter insertion area. Follow these instructions at home:  Insertion site care Follow instructions from your health care provider about how to take care of your insertion site. Make sure you: Wash your hands with soap and water before you change your bandage (dressing). If soap and water are not available, use hand sanitizer. Remove your dressing as told by your health care provider. In 24 hours Check your insertion site every day for signs of infection. Check for: Redness, swelling, or pain. Pus or a bad smell. Warmth. You may shower 24-48 hours after the procedure. Do not apply powder or lotion to the site.  Activity For 24 hours after the procedure, or as directed by your health care provider: Do not push or pull heavy objects with the affected arm. Do not drive yourself home from the hospital or clinic. You may drive 24 hours after the procedure unless your health care provider tells you not to. Do not lift anything that is heavier than 10 lb (4.5 kg), or the limit that you are told, until your health care provider says that it is safe.  For 24 hours   

## 2023-10-13 NOTE — Progress Notes (Unsigned)
[address]       [place] [address]             [phone]           Vanessa [name] [facility] Medical Record #[id number] Date of Birth: [birth year]  Vanessa Height, [place] Vanessa [name], [place]  Chief Complaint:  DOE   History of Present Illness:     Pt is a very pleasant 72 yo female who has had a long history of mv disease. Pt was found to have non ischemic cardiomyopathy and severe MR and underwent Mitral Clip x2 in 2020. Pt over the years has had follow up with increasing MR and EF 40-45%. She has been suffering from exertional DOE mostly with climbing stairs and on inclines. She has lower ext edema when she becomes fluid overloaded and has been on GDMT adjustments due to side effects. Recently with increasing DOE she underwent TEE to determine if additional Mitral clip placement would be an option and unfortunately felt not to be a candidate. She underwent cath with no CAD but with moderate PHTN. She has not had atrial fibrillation issues. Pt very anxious to proceed as soon as possible to "get this behind her"      Past Medical History:  Diagnosis Date   CHF (congestive heart failure) (HCC)    Dysrhythmia    PVC's   Hypertension    Moderate mitral regurgitation    moderate to severe MR with moderate pulmonary HTN   Nonischemic cardiomyopathy (HCC)    EF 30-35% by echo 2015   Pulmonary HTN (HCC)    moderate with PASP by echo 2015   PVC's (premature ventricular contractions)     Past Surgical History:  Procedure Laterality Date   CARDIAC CATHETERIZATION  2003   normal coronary arteries   CARDIAC CATHETERIZATION  2013  Nov   h/o nonischemic DCM EF 35-40% increase LVEDP    HYSTEROSCOPY WITH D & C  12/02/2012   Procedure: DILATATION AND CURETTAGE /HYSTEROSCOPY;  Surgeon: Dorien Chihuahua. Richardson Dopp, MD;  Location: [facility];  Service: Gynecology;  Laterality: N/A;  cervical polypectomy   LEFT AND RIGHT HEART CATHETERIZATION WITH CORONARY ANGIOGRAM N/A 02/15/2015    Procedure: LEFT AND RIGHT HEART CATHETERIZATION WITH CORONARY ANGIOGRAM;  Surgeon: Corky Crafts, MD;  Location: [facility] CATH LAB;  Service: Cardiovascular;  Laterality: N/A;   RIGHT/LEFT HEART CATH AND CORONARY ANGIOGRAPHY N/A 10/12/2023   Procedure: RIGHT/LEFT HEART CATH AND CORONARY ANGIOGRAPHY;  Surgeon: Laurey Morale, MD;  Location: [facility] INVASIVE CV LAB;  Service: Cardiovascular;  Laterality: N/A;   TEE WITHOUT CARDIOVERSION N/A 10/11/2018   Procedure: TRANSESOPHAGEAL ECHOCARDIOGRAM (TEE);  Surgeon: Laurey Morale, MD;  Location: [facility] ENDOSCOPY;  Service: Cardiovascular;  Laterality: N/A;   TEE WITHOUT CARDIOVERSION N/A 09/07/2023   Procedure: TRANSESOPHAGEAL ECHOCARDIOGRAM;  Surgeon: Laurey Morale, MD;  Location: [facility] INVASIVE CV LAB;  Service: Cardiovascular;  Laterality: N/A;   TRANSCATHETER MITRAL EDGE TO EDGE REPAIR N/A 12/14/2018   Procedure: MITRAL VALVE REPAIR;  Surgeon: Tonny Bollman, MD;  Location: [facility] INVASIVE CV LAB;  Service: Cardiovascular;  Laterality: N/A;    Social History   Tobacco Use  Smoking Status Never  Smokeless Tobacco Never    Social History   Substance and Sexual Activity  Alcohol Use No   Alcohol/week: 0.0 standard drinks of alcohol    Social History   Socioeconomic History   Marital status: Single    Spouse name: Not on file  Number of children: Not on file   Years of education: Not on file   Highest education level: Not on file  Occupational History   Occupation: Retired    Comment: Textile, [employer]  Tobacco Use   Smoking status: Never   Smokeless tobacco: Never  Vaping Use   Vaping status: Never Used  Substance and Sexual Activity   Alcohol use: No    Alcohol/week: 0.0 standard drinks of alcohol   Drug use: No   Sexual activity: Not on file  Other Topics Concern   Not on file  Social History Narrative   Not on file   Social Determinants of Health   Financial Resource Strain: Not on file  Food Insecurity: Not  on file  Transportation Needs: Not on file  Physical Activity: Not on file  Stress: Not on file  Social Connections: Not on file  Intimate Partner Violence: Not on file    No Known Allergies  Current Outpatient Medications  Medication Sig Dispense Refill   aspirin 81 MG EC tablet Take 1 tablet (81 mg total) by mouth daily.     Calcium Carb-Cholecalciferol (CALCIUM + VITAMIN D3 PO) Take 1 tablet by mouth in the morning.     carvedilol (COREG) 6.25 MG tablet Take 1 tablet by mouth twice daily 180 tablet 0   empagliflozin (JARDIANCE) 10 MG TABS tablet TAKE 1 TABLET BY MOUTH ONCE DAILY BEFORE BREAKFAST 90 tablet 3   ibandronate (BONIVA) 150 MG tablet Take 150 mg by mouth every 30 (thirty) days.     torsemide (DEMADEX) 20 MG tablet TAKE 2 TABLETS(40 MG) ALTERNATING WITH 1 TABLET (20 MG). (2 TABS ONCE DAILY, 1 TAB THE NEXT DAY). 90 tablet 0   Vericiguat (VERQUVO) 2.5 MG TABS Take 1 tablet (2.5 mg total) by mouth daily. 30 tablet 11   No current facility-administered medications for this visit.     Family History  Problem Relation Age of Onset   Breast cancer Mother 4   Heart disease Father    Breast cancer Sister 37   Colon cancer Neg Hx    Esophageal cancer Neg Hx    Rectal cancer Neg Hx    Stomach cancer Neg Hx    Thyroid disease Neg Hx        Physical Exam: Teeth in good repair Lungs; clear Card: RR with 3/6 sem Ext: mild edema Neuro: intact     Diagnostic Studies & Laboratory data: I have personally reviewed the following studies and agree with the findings   TEE (08/2023) IMPRESSIONS     1. Left ventricular ejection fraction, by estimation, is 40 to 45%. The  left ventricle has mildly decreased function. The left ventricle  demonstrates global hypokinesis. The left ventricular internal cavity size  was mildly dilated.   2. Right ventricular systolic function is moderately reduced. The right  ventricular size is mildly enlarged.   3. Left atrial size was  severely dilated. No left atrial/left atrial  appendage thrombus was detected.   4. Right atrial size was mildly dilated.   5. Suspect PFO present by color doppler. Unable to do bubble study as we  had to end the study due to hypotension.   6. The patient is status post placement of 2 Mitraclips oriented at A2P2.  MVA planimetered in short axis 4.19 cm^2. Mitral valve mean gradient 4  mmHg. Pulmonary vein systolic doppler signal was flattened but there was  no pulmonary vein systolic doppler  flow reversal in the  4 veins sampled. There was severe, eccentric residual  mitral regurgitation directed along the posterolateral atrial wall. The  mitral regurgitation appears to originate posteriorly from between the two  Mitraclips. Vena contracta area  0.68 cm^2, PISA ERO 0.54 cm^2. Of note, BP was low during this study  (80/54 towards the end) due to sedation (BP at normal resting state  signifantly higher).   7. Peak RV-RA gradient 63 mmHg. The tricuspid valve is abnormal.  Tricuspid valve regurgitation is mild to moderate.   8. The aortic valve is tricuspid. Aortic valve regurgitation is not  visualized. No aortic stenosis is present.   FINDINGS   Left Ventricle: Left ventricular ejection fraction, by estimation, is 40  to 45%. The left ventricle has mildly decreased function. The left  ventricle demonstrates global hypokinesis. The left ventricular internal  cavity size was mildly dilated. There is   no left ventricular hypertrophy.   Right Ventricle: The right ventricular size is mildly enlarged. No  increase in right ventricular wall thickness. Right ventricular systolic  function is moderately reduced.   Left Atrium: Left atrial size was severely dilated. No left atrial/left  atrial appendage thrombus was detected.   Right Atrium: Right atrial size was mildly dilated.   Pericardium: There is no evidence of pericardial effusion.   Mitral Valve: MVA planimetered in short axis  4.19 cm^2. Mitral valve mean  gradient 4 mmHg. Pulmonary vein systolic doppler signal was flattened but  there was no pulmonary vein systolic doppler flow reversal in the 4 veins  sampled. The mitral valve has  been repaired/replaced. Severe mitral valve regurgitation. Mild mitral  valve stenosis. MV peak gradient, 7.3 mmHg. The mean mitral valve gradient  is 4.0 mmHg.   Tricuspid Valve: Peak RV-RA gradient 63 mmHg. The tricuspid valve is  abnormal. Tricuspid valve regurgitation is mild to moderate.   Aortic Valve: The aortic valve is tricuspid. Aortic valve regurgitation is  not visualized. No aortic stenosis is present.   Pulmonic Valve: The pulmonic valve was normal in structure. Pulmonic valve  regurgitation is not visualized.   Aorta: The aortic root is normal in size and structure.   IAS/Shunts: Suspect PFO present by color doppler. Unable to do bubble  study as we had to end the study due to hypotension.   Additional Comments: Spectral Doppler performed.   MITRAL VALVE                  TRICUSPID VALVE  MV Peak grad: 7.3 mmHg        TR Peak grad:   63.0 mmHg  MV Mean grad: 4.0 mmHg        TR Vmax:        397.00 cm/s  MV Vmax:      1.35 m/s  MV Vmean:     97.0 cm/s  MR Peak grad:    67.9 mmHg  MR Mean grad:    44.0 mmHg  MR Vmax:         412.00 cm/s  MR Vmean:        315.0 cm/s  MR PISA:         8.31 cm  MR PISA Eff ROA: 72 mm  MR PISA Radius:  1.15 cm      Recent Radiology Findings:   CARDIAC CATHETERIZATION  Result Date: 10/12/2023 1. Normal RA pressure, mildly elevated PCWP. 2. Moderate mixed pulmonary arterial and pulmonary venous hypertension likely due to mitral valve regurgitation. 3. Preserved cardiac output and PAPi.  4. No significant coronary disease noted. 25 cc contrast used, kept minimal due to CKD.    Hemo Data  Flowsheet Row Most Recent Value  Fick Cardiac Output 4.67 L/min  Fick Cardiac Output Index 2.52 (L/min)/BSA  RA A Wave 9 mmHg  RA V Wave  6 mmHg  RA Mean 5 mmHg  RV Systolic Pressure 63 mmHg  RV Diastolic Pressure 3 mmHg  RV EDP 8 mmHg  PA Systolic Pressure 61 mmHg  PA Diastolic Pressure 19 mmHg  PA Mean 39 mmHg  PW A Wave 22 mmHg  PW V Wave 21 mmHg  PW Mean 18 mmHg  AO Systolic Pressure 113 mmHg  AO Diastolic Pressure 56 mmHg  AO Mean 79 mmHg  LV Systolic Pressure 100 mmHg  LV Diastolic Pressure 2 mmHg  LV EDP 10 mmHg  AOp Systolic Pressure 114 mmHg  AOp Diastolic Pressure 61 mmHg  AOp Mean Pressure 83 mmHg  LVp Systolic Pressure 96 mmHg  LVp Diastolic Pressure 3 mmHg  LVp EDP Pressure 4 mmHg  QP/QS 1  TPVR Index 15.46 HRUI  TSVR Index 33.69 HRUI  PVR SVR Ratio 0.26  TPVR/TSVR Ratio 0.46    Recent Lab Findings: Lab Results  Component Value Date   WBC 5.0 10/12/2023   HGB 11.2 (L) 10/12/2023   HGB 10.9 (L) 10/12/2023   HCT 33.0 (L) 10/12/2023   HCT 32.0 (L) 10/12/2023   PLT 188 10/12/2023   GLUCOSE 113 (H) 09/22/2023   CHOL 178 08/11/2019   TRIG 62 08/11/2019   HDL 57 08/11/2019   LDLCALC 109 (H) 08/11/2019   ALT 7 12/13/2018   AST 11 (L) 12/13/2018   NA 140 10/12/2023   NA 139 10/12/2023   K 4.2 10/12/2023   K 4.3 10/12/2023   CL 105 09/22/2023   CREATININE 1.78 (H) 09/22/2023   BUN 44 (H) 09/22/2023   CO2 28 09/22/2023   TSH 0.560 05/26/2023   INR 1.20 12/13/2018   HGBA1C 6.2 (H) 12/13/2018      Assessment / Plan:     72 yo female with NYHA class 2 symptoms of recurrent MR following Mitral clip placement and with depressed LV function and moderate PHTN with no CAD. PT also with moderate TR and will need to assess TV annular size due to possible need for TV repair with PHTN. Pt understands will need mv replacement with this surgery and will be with a tissues valve. All the risks, goals and recovery were discussed and she wishes to proceed. Will perform surgery on 12/30   I have spent 60 min in review of the records, viewing studies and in face to face with patient and in coordination of  future care    Eugenio Hoes 10/13/2023 12:22 PM

## 2023-10-14 ENCOUNTER — Institutional Professional Consult (permissible substitution): Payer: PPO | Admitting: Thoracic Surgery (Cardiothoracic Vascular Surgery)

## 2023-10-14 ENCOUNTER — Other Ambulatory Visit: Payer: Self-pay | Admitting: *Deleted

## 2023-10-14 ENCOUNTER — Other Ambulatory Visit: Payer: Self-pay | Admitting: Thoracic Surgery (Cardiothoracic Vascular Surgery)

## 2023-10-14 ENCOUNTER — Encounter: Payer: Self-pay | Admitting: *Deleted

## 2023-10-14 ENCOUNTER — Encounter: Payer: Self-pay | Admitting: Thoracic Surgery (Cardiothoracic Vascular Surgery)

## 2023-10-14 VITALS — BP 115/75 | HR 75 | Resp 20 | Ht 63.0 in | Wt 180.0 lb

## 2023-10-14 DIAGNOSIS — I340 Nonrheumatic mitral (valve) insufficiency: Secondary | ICD-10-CM

## 2023-10-14 DIAGNOSIS — I071 Rheumatic tricuspid insufficiency: Secondary | ICD-10-CM

## 2023-10-14 NOTE — Patient Instructions (Signed)
MV replacement TV repair 12/30

## 2023-10-21 ENCOUNTER — Other Ambulatory Visit (HOSPITAL_COMMUNITY): Payer: Self-pay | Admitting: Cardiology

## 2023-10-21 DIAGNOSIS — I5022 Chronic systolic (congestive) heart failure: Secondary | ICD-10-CM

## 2023-11-03 ENCOUNTER — Other Ambulatory Visit (HOSPITAL_COMMUNITY): Payer: Self-pay | Admitting: Cardiology

## 2023-11-12 NOTE — Progress Notes (Signed)
ID:  Vanessa [name], DOB 1951/06/05, MRN [id number]   Provider location: [facility] Type of Visit: Established patient   PCP:  Milus Height, PA  HF Cardiologist: Dr. Shirlee Latch   HPI: Vanessa [name] is a 72 y.o. with history of nonischemic cardiomyopathy and presents for followup of CHF. Patient has had a low EF recognized since at least 9/14, when echo showed EF 40-45%.  In Jul 13, 2023, her child died, which led to significant stress.  She developed worsening exertional dyspnea and subsequent echo showed a fall in EF to 25-30%.  She had left and right heart catheterization in 3/16 with no significant coronary disease detected and moderately elevated right and left heart filling pressures along with pulmonary venous hypertension.  Echo was done 12/16: EF 45%, diffuse hypokinesis. She had a repeat echo 11/17 => EF 45% with moderately dilated LV, moderate MR.  Echo 11/18 showed EF stable at 45% with mild LV dilation and mildly decreased RV systolic function, moderate MR.   Spironolactone was stopped due to elevated K. She apparently did not tolerate Veltassa well.   Echo was done in 11/19.  EF 30-35% with mild LV hypertrophy, mild-moderately decreased RV systolic function, possible severe MR with restricted posterior leaflet, PASP 50 mmHg, severe LAE.  TEE was done in 11/19 to assess mitral regurgitation.  This showed severe MR with restricted posterior leaflet.   In 1/20, she had Mitraclip with reduction in MR to 1+.    She had echo in 9/20 with EF 30-35%, severe LAE, normal RV, s/p Mitraclip with mild-moderate MR and mild MS.  The IVC was dilated, suggesting volume overload.   She was unable to tolerate Bidil due to lightheadedness.   Echo in 1/21 showed EF 40-45%, RV normal, biatrial dilation, mild mitral stenosis mean 5 mmHg, mild-moderate MR (s/p Mitraclip).  Echo in 1/22 showed EF 40-45% with moderate LV dilation, normal RV size and systolic function, severe LAE,  s/p Mitraclip with probably mild mitral stenosis (mean gradient 7, MVA 2.2 cm^2 by PHT), moderate MR.   Echo in 3/23 showed EF 40-45%, moderate LV dilation, normal RV, normal IVC, s/p MV repair with Mitraclip with mild mitral stenosis mean gradient 7 mmHg, moderate MR.   Patient had labs done in 8/23 showing K up to 6.1 with AKI/creatinine 2.88.  She was admitted and K was treated.  Entresto, spironolactone, Coreg, and Veltassa were stopped.  Torsemide was decreased to 10 mg daily.   Echo (9/23) showed EF stable 40-45%, mild MS (mean gradient 7 mmHg) and mild to moderate MR s/p Mitraclip.   Echo in 9/24 showed EF 40-45%, mild LV dilation and mild LVH, mildly decreased RV systolic function, PASP 47 mmHg, s/p Mitraclip with mean gradient 6 mmHg, suspect severe mitral regurgitation, IVC normal, small secundum ASD.  TEE in 10/24 showed EF 40-45%, mild LV dilation, moderate RV dysfunction with mild RV enlargement, severe LAE, 2 Mitraclips in place with mean gradient 4 mmHg and severe eccentric MR from between the 2 clips with ERO 0.54 cm^2 and peak RV-RA gradient 63 mmHg.  I reviewed the TEE with structural heart colleagues, and there does not appear to be room between the 2 existing Mitraclips to place a 3rd clip.  Today she returns for HF follow up. Weight is up 4 lbs.  She has had episodes of transient left arm "squeezing" since starting Verquvo.  Episodes last a few minutes and are not exertional.  She still  works part time at a daycare center but overall is not very active.  She is short of breath walking up stairs and with doing heavier housework, no problems walking on flat ground.  No chest pain. No orthopnea/PND.  No lightheadedness.   ECG (personally reviewed): NSR, PVC, LVH with repolarization abnormality  Labs (7/16): K 4.5, creatinine 1.12, BNP 596 Labs (8/16): K 4 => 5.1, creatinine 1.29 => 1.5, BNP 388 => 426 Labs (9/16): K 4, creatinine 1.08, BNP 502 Labs (12/16): K 4.9, creatinine  1.57 Labs (5/17): K 5.2, creatinine 1.3 Labs (8/17): BNP 152 Labs (11/17): K 4.9, creatinine 1.32, BNP 236 Labs (8/18): K 4.6, creatinine 1.57 Labs (11/18): K 3.8, creatinine 1.29 Labs (2/19): K 5.1, creatinine 1.44 Labs (9/19): K 3.8, creatinine 1.08 Labs (11/19): K 4.3, creatinine 1.3 Labs (1/20): K 4.8, creatinine 1.68 Labs (3/20): K 4.4, creatinine 1.5 Labs (9/20): K 4.4, creatinine 1.77 => 1.48, LDL 109 Labs (10/20): K 4.6, creatinine 1.66 Labs (12/20): K 4.7, creatinine 1.59 Labs (3/21): K 4.5, creatinine 1.73 Labs (6/21): K 4.6, creatinine 1.7 Labs (10/21): TSH normal, K 4.2, creatinine 1.37 Labs (3/22): K 4.7, creatinine 1.44 Labs (5/22): K 4.2, creatinine 1.75 Labs (9/22): K 4.5, creatinine 2.12 => 1.81 Labs (12/22): K 4, creatinine 1.31 Labs (3/23): K 4.2, creatinine 1.75, BNP 1276 Labs (6/23): TSH normal Labs (8/23): K 6.1 => 5.2, creatinine 1.95 => 2.88 => 2.21, BUN 120 => 111 Labs (10/23): K 4.6, creatinine 1.66 Labs (12/23): K 4.5, creatinine 2.43 Labs (2/24): K 4, creatinine 1.7, BNP 775 Labs (8/24): SPEP negative, K 5.4, creatinine 1.58, TSH normal Labs (9/24): BNP 781, K 4.2, creatinine 1.66  PMH: 1. Cardiomyopathy: Nonischemic.  Echo (9/14) with EF 40-45%.  Echo (2/16) with EF 25-30%.  Echo (4/16) with EF 30-35%, moderate MR.  Echo (6/16) with EF 40%, moderate LV dilation, moderate diastolic dysfunction, moderate MR, moderate TR, PA systolic pressure 52 mmHg. RHC/LHC (3/16) with no significant CAD, EF 20-25%, 3+ MR; mean RA 13, PA 70/30 mean 45, mean PCWP 31 mmHg, CI 2.2, PVR 3.1 WU.  Echo (12/16) with EF 45%, diffuse hypokinesis, moderate MR, normal RV size and systolic function.  - Echo (11/17): EF 45%, moderately dilated LV, moderate eccentric posterior MR, PASP 42 mmHg, normal RV size and systolic function.  - Echo (11/18): EF 45%, mild LV dilation, moderate diastolic dysfunction, normal RV size with mildly decreased systolic function, PASP 50 mmHg, moderate MR.   - Echo (11/19): EF 30-35%, mild LV dilation, severe LAE, mild-moderately decreased RV systolic function, possible severe MR with restricted posterior leaflet, PASP 50 mmHg.  - Echo (1/20): EF 30-35%, severe LV dilation, s/p Mitraclip with mild MR and mild MS, PASP 48 mmHg.  - RHC (1/20, with Mitraclip): mean RA 10, mean LA 18 - Echo (2/20): EF 30-35%, s/p mitraclip with mild mitral stenosis, mean MV gradient 5 mmHg, moderate MR, PASP 56 mmHg.  - Echo (9/20): EF 30-35%, severe LAE, normal RV, s/p Mitraclip with mild-moderate MR and mild MS.  The IVC was dilated, suggesting volume overload.  - Echo (1/21): EF 40-45%, RV normal, biatrial dilation, mild mitral stenosis mean 5 mmHg, mild-moderate MR (s/p Mitraclip).  - Echo (1/22): EF 40-45% with moderate LV dilation, normal RV size and systolic function, severe LAE, s/p Mitraclip with probably mild mitral stenosis (mean gradient 7, MVA 2.2 cm^2 by PHT), moderate MR. - Echo (3/23): EF 40-45%, moderate LV dilation, normal RV, normal IVC, s/p MV repair with Mitraclip with mild  mitral stenosis mean gradient 7 mmHg, moderate MR.  - Echo (9/23): EF 40-45%, grade I DD, normal RV, s/p MC repair with Mitraclip with mild mitral stenosis mean gradient 7 mmHg, moderate MR. - Echo (9/24): EF 40-45%, mild LV dilation and mild LVH, mildly decreased RV systolic function, PASP 47 mmHg, s/p Mitraclip with mean gradient 6 mmHg, suspect severe mitral regurgitation, IVC normal, small secundum ASD.  - TEE (10/24): EF 40-45%, mild LV dilation, moderate RV dysfunction with mild RV enlargement, severe LAE, 2 Mitraclips in place with mean gradient 4 mmHg and severe eccentric MR from between the 2 clips with ERO 0.54 cm^2 and peak RV-RA gradient 63 mmHg. 2. PVCs: Holter (10/15) with 12% PVCs. 3. Pulmonary venous hypertension: CTA chest (7/16) negative for PE. PFTs (7/16) with minimal obstruction, mild restriction, moderately decreased DLCO.  4. Thyroid nodules: Biopsy benign.  5.  CKD stage 3: Episodes of AKI/hyperkalemia in 8/23.  6. Mitral regurgitation: TEE (11/19) with EF 30-35%, moderate LV dilation, severe MR with posterior leaflet restriction and ERO 0.41 cm^2, no mitral stenosis, mild to moderately decreased RV systolic function, peak RV-RA gradient 50 mmHg.  - Mitraclip placement x 2 in 1/20 with 1+ residual MR.  - Echo (2/20) with moderate residual MR, mild MS with mean gradient 5 mmHg.  - Echo (9/20) with mild-moderate MR, mild MS.  - Echo (1/21) with mild-moderate MR, mild MS s/p Mitraclip - Echo (1/22) with moderate residual MR, mild-moderate mitral stenosis.  - Echo (3/23) with moderate MR, mild MS s/p Mitraclip.  - Echo (9/23) with mild-moderate MR, mild MS mean gradient 7 mmHg - Echo (9/24) with severe MR, mild MS mean gradient 6 mmHg post-Mitraclip.  - TEE (10/24): with 2 Mitraclips in place with mean gradient 4 mmHg and severe eccentric MR from between the 2 clips with ERO 0.54 cm^2. 7. Hyperthyroism: Toxic multinodular goiter.   SH: Lives alone, only child passed away in 06/29/23, nonsmoker, no ETOH or drugs.   FH: Father with MIs  ROS: All systems reviewed and negative except as per HPI.   Current Outpatient Medications  Medication Sig Dispense Refill   aspirin 81 MG EC tablet Take 1 tablet (81 mg total) by mouth daily.     Calcium Carb-Cholecalciferol (CALCIUM + VITAMIN D3 PO) Take 1 tablet by mouth in the morning.     carvedilol (COREG) 6.25 MG tablet Take 1 tablet by mouth twice daily 180 tablet 0   empagliflozin (JARDIANCE) 10 MG TABS tablet TAKE 1 TABLET BY MOUTH ONCE DAILY BEFORE BREAKFAST 90 tablet 3   ibandronate (BONIVA) 150 MG tablet Take 150 mg by mouth every 30 (thirty) days.     torsemide (DEMADEX) 20 MG tablet TAKE 2 TABLETS BY MOUTH ONCE DAILY ALTERNATING WITH TAKING 1 TABLET THE NEXT DAY 90 tablet 0   Vericiguat (VERQUVO) 2.5 MG TABS Take 1 tablet (2.5 mg total) by mouth daily. 30 tablet 11   No current facility-administered  medications for this visit.   There were no vitals taken for this visit.   Wt Readings from Last 3 Encounters:  10/14/23 81.6 kg (180 lb)  10/12/23 82.1 kg (181 lb)  09/22/23 82 kg (180 lb 12.8 oz)   General: NAD Neck: JVP 8 cm, goiter.  Lungs: Clear to auscultation bilaterally with normal respiratory effort. CV: Nondisplaced PMI.  Heart regular S1/S2, no S3/S4, 3/6 HSM apex.  No peripheral edema.  No carotid bruit.  Normal pedal pulses.  Abdomen: Soft, nontender, no hepatosplenomegaly, no distention.  Skin: Intact without lesions or rashes.  Neurologic: Alert and oriented x 3.  Psych: Normal affect. Extremities: No clubbing or cyanosis.  HEENT: Normal.   Assessment/Plan: 1. Chronic systolic CHF: Nonischemic cardiomyopathy.  EF up to 45% on 12/16, repeat echo 11/18 shows that EF remains about 45%.  Etiology of NICM still unclear. Had frequent PVCs by past holter (12%) but doesn't explain her cardiomyopathy. Had improvement of palpitations on Coreg. Cannot rule out myocarditis. May also have component of Takotsubo with passing of child but EF has not improved as completely as would be expected with Takotsubo. Have previously discussed cardiac MRI to look for infiltrative disease but she says that she would be too claustrophobic.  Echo in 11/19 showed EF down to 30-35% and mild to moderate RV systolic function.  There also appeared to be severe mitral regurgitation, which seemed progressive from the past. TEE in 11/19 confirmed severe MR with restricted posterior leaflet => Mitraclip 1/20 with decrease in MR to 1+.  Echo in 1/20 showed EF 30-35% with mild MR s/p Mitraclip. Echo in 9/20 with stable EF 30-35% but dilated IVC suggestive of volume overload.  Echo in 1/22 with EF 40-45%, normal RV.  Echo in 3/23 showed  EF 40-45%, moderate LV dilation, normal RV, normal IVC, s/p MV repair with Mitraclip with mild mitral stenosis mean gradient 7 mmHg, moderate MR. Echo (9/23) showed EF stable 40-45%,  mild MS (mean gradient 7 mmHg) and mild to moderate MR s/p Mitraclip.  Echo in 9/24 showed EF 40-45%, mild LV dilation and mild LVH, mildly decreased RV systolic function, PASP 47 mmHg, s/p Mitraclip with mean gradient 6 mmHg, suspect severe mitral regurgitation, IVC normal, small secundum ASD.  Mitral regurgitation has worsened, complicating CHF management.  GDMT has been limited by soft BP and CKD. She is probably mildly volume overloaded on exam today.  - Continue torsemide 20 mg daily alternating with 40 mg daily. BMET/BNP today.  - Continue Coreg 6.25 mg bid. - Continue empagliflozin 10 mg daily.    - She will stay off spironolactone and Entresto for now with elevated creatinine and history of hyperkalemia.  Of note, she did not tolerate Bidil in the past.    - Continue Verquvo 2.5 mg daily, I will not increase this as she thinks it causes arm pain.   - EF now above ICD range.   2. PVCs: Frequent (12%) on 10/15 holter.   Pt refused repeat holter monitor for further assessment. PVC count was probably not high enough to cause cardiomyopathy, unlikely primary etiology.  No palpitations currently.  - Continue Coreg. 3. Pulmonary hypertension: Primarily pulmonary venous HTN (PVR 3.1) from elevated left atrial pressure (previous RHC above).   - Best treatment remains adequate diuresis. 4. Mitral regurgitation:  TEE in 11/19 with severe MR, restricted posterior leaflet.  She had Mitraclip x 2 in 1/20. Post-op echo showed mild MR and mild mitral stenosis.  Echo in 1/22 showed moderate MR, mild-moderate MS. Echo in 3/23 showed moderate MR, mild-moderate MS. Echo 9/23 showed mild-moderate MR, mild MS (mean gradient 7 mmHg).  Echo in 9/24 showed MV s/p Mitraclip x 2 with mean gradient 6 mmHg and severe mitral regurgitation. TEE was then done to evaluate, showing 2 Mitraclips in place with mean gradient 4 mmHg and severe eccentric MR from between the 2 clips with ERO 0.54 cm^2.  I reviewed the TEE with  structural heart colleagues.  She is not thought to be a candidate for an additional Mitraclip  due to lack of room between the two existing clips. - I am going to refer her to Dr. Leafy Ro for evaluation for MV replacement.  - I will arrange for [facility] for presurgical evaluation (pre-MVR).  I discussed risks/benefits with her and she agrees to procedure.  Will need to minimize contrast given CKD stage 3.  5. 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 TSH was normal in 8/24.  - Followup with endocrinology.  6. CKD stage 3 with h/o AKI in setting of hypotension: She is now off Entresto and spironolactone.  - BMET today.  - Follows with nephrology.   Followup in 2 months  Signed, Jacklynn Ganong, FNP  11/12/2023  [facility] [address] [facility] [place] [address] [phone] (office) [phone] (fax)

## 2023-11-15 ENCOUNTER — Ambulatory Visit (HOSPITAL_COMMUNITY)
Admission: RE | Admit: 2023-11-15 | Discharge: 2023-11-15 | Disposition: A | Discharge status: Home / Self Care | Payer: PPO | Source: Ambulatory Visit | Attending: Family Medicine | Admitting: Family Medicine

## 2023-11-15 ENCOUNTER — Encounter (HOSPITAL_COMMUNITY): Payer: Self-pay

## 2023-11-15 ENCOUNTER — Ambulatory Visit (HOSPITAL_COMMUNITY)
Admission: RE | Admit: 2023-11-15 | Discharge: 2023-11-15 | Disposition: A | Discharge status: Home / Self Care | Payer: PPO | Source: Ambulatory Visit | Attending: Thoracic Surgery (Cardiothoracic Vascular Surgery) | Admitting: Thoracic Surgery (Cardiothoracic Vascular Surgery)

## 2023-11-15 VITALS — BP 104/70 | HR 74 | Wt 181.0 lb

## 2023-11-15 DIAGNOSIS — I130 Hypertensive heart and chronic kidney disease with heart failure and stage 1 through stage 4 chronic kidney disease, or unspecified chronic kidney disease: Secondary | ICD-10-CM | POA: Diagnosis not present

## 2023-11-15 DIAGNOSIS — I5022 Chronic systolic (congestive) heart failure: Secondary | ICD-10-CM | POA: Diagnosis present

## 2023-11-15 DIAGNOSIS — I428 Other cardiomyopathies: Secondary | ICD-10-CM | POA: Insufficient documentation

## 2023-11-15 DIAGNOSIS — E0520 Thyrotoxicosis with toxic multinodular goiter without thyrotoxic crisis or storm: Secondary | ICD-10-CM | POA: Insufficient documentation

## 2023-11-15 DIAGNOSIS — N183 Chronic kidney disease, stage 3 unspecified: Secondary | ICD-10-CM | POA: Insufficient documentation

## 2023-11-15 DIAGNOSIS — I340 Nonrheumatic mitral (valve) insufficiency: Secondary | ICD-10-CM | POA: Diagnosis not present

## 2023-11-15 DIAGNOSIS — Z7984 Long term (current) use of oral hypoglycemic drugs: Secondary | ICD-10-CM | POA: Insufficient documentation

## 2023-11-15 DIAGNOSIS — I493 Ventricular premature depolarization: Secondary | ICD-10-CM | POA: Diagnosis not present

## 2023-11-15 DIAGNOSIS — I071 Rheumatic tricuspid insufficiency: Secondary | ICD-10-CM | POA: Diagnosis not present

## 2023-11-15 DIAGNOSIS — E041 Nontoxic single thyroid nodule: Secondary | ICD-10-CM

## 2023-11-15 DIAGNOSIS — I272 Pulmonary hypertension, unspecified: Secondary | ICD-10-CM | POA: Diagnosis not present

## 2023-11-15 DIAGNOSIS — I6521 Occlusion and stenosis of right carotid artery: Secondary | ICD-10-CM | POA: Diagnosis not present

## 2023-11-15 DIAGNOSIS — Z79899 Other long term (current) drug therapy: Secondary | ICD-10-CM | POA: Diagnosis not present

## 2023-11-15 DIAGNOSIS — I052 Rheumatic mitral stenosis with insufficiency: Secondary | ICD-10-CM | POA: Insufficient documentation

## 2023-11-15 LAB — BASIC METABOLIC PANEL
Anion gap: 9 (ref 5–15)
BUN: 47 mg/dL — ABNORMAL HIGH (ref 8–23)
CO2: 25 mmol/L (ref 22–32)
Calcium: 8.9 mg/dL (ref 8.9–10.3)
Chloride: 105 mmol/L (ref 98–111)
Creatinine, Ser: 1.71 mg/dL — ABNORMAL HIGH (ref 0.44–1.00)
GFR, Estimated: 31 mL/min — ABNORMAL LOW (ref >60)
Glucose, Bld: 107 mg/dL — ABNORMAL HIGH (ref 70–99)
Potassium: 4 mmol/L (ref 3.5–5.1)
Sodium: 139 mmol/L (ref 135–145)

## 2023-11-15 NOTE — Patient Instructions (Addendum)
Thank you for coming in today  If you had labs drawn today, any labs that are abnormal the clinic will call you No news is good news  EKG was done today   Medications: No changes  Follow up appointments:  Your physician recommends that you schedule a follow-up appointment in:  6 weeks in clinic   Do the following things EVERYDAY: Weigh yourself in the morning before breakfast. Write it down and keep it in a log. Take your medicines as prescribed Eat low salt foods--Limit salt (sodium) to 2000 mg per day.  Stay as active as you can everyday Limit all fluids for the day to less than 2 liters   At the [facility], you and your health needs are our priority. As part of our continuing mission to provide you with exceptional heart care, we have created designated Provider Care Teams. These Care Teams include your primary Cardiologist (physician) and Advanced Practice Providers (APPs- Physician Assistants and Nurse Practitioners) who all work together to provide you with the care you need, when you need it.   You may see any of the following providers on your designated Care Team at your next follow up: Dr Arvilla Meres Dr Marca Ancona Dr. Marcos Eke, NP Robbie Lis, [place], [place] Brynda Peon, NP Karle Plumber, PharmD   Please be sure to bring in all your medications bottles to every appointment.    Thank you for choosing  [facility]  If you have any questions or concerns before your next appointment please send [place] a message through [place] or call our office at [phone].    TO LEAVE A MESSAGE FOR THE NURSE SELECT OPTION 2, PLEASE LEAVE A MESSAGE INCLUDING: YOUR NAME DATE OF BIRTH CALL BACK NUMBER REASON FOR CALL**this is important as we prioritize the call backs  YOU WILL RECEIVE A CALL BACK THE SAME DAY AS LONG AS YOU CALL BEFORE 4:00 PM

## 2023-11-22 NOTE — Progress Notes (Signed)
Surgical Instructions   Your procedure is scheduled on Monday November 29, 2023. Report to [facility] Main Entrance "A" at 5:30 A.M., then check in with the Admitting office. Any questions or running late day of surgery: call [phone]  Questions prior to your surgery date: call [phone], Monday-Friday, 8am-4pm. If you experience any cold or flu symptoms such as cough, fever, chills, shortness of breath, etc. between now and your scheduled surgery, please notify us at the above number.     Remember:  Do not eat or drink after midnight the night before your surgery   Take these medicines the morning of surgery with A SIP OF WATER  carvedilol (COREG)  Vericiguat (VERQUVO)    DO NOT TAKE YOUR aspirin THE MORNING OF SURGERY.  STOP YOUR  empagliflozin (JARDIANCE) 72 HOURS PRIOR TO SURGERY, WITH THE LAST DOSE BEING 11/25/2023.    One week prior to surgery, STOP taking any Aleve, Naproxen, Ibuprofen, Motrin, Advil, Goody's, BC's, all herbal medications, fish oil, and non-prescription vitamins.                     Do NOT Smoke (Tobacco/Vaping) for 24 hours prior to your procedure.  If you use a CPAP at night, you may bring your mask/headgear for your overnight stay.   You will be asked to remove any contacts, glasses, piercing's, hearing aid's, dentures/partials prior to surgery. Please bring cases for these items if needed.    Patients discharged the day of surgery will not be allowed to drive home, and someone needs to stay with them for 24 hours.  SURGICAL WAITING ROOM VISITATION Patients may have no more than 2 support people in the waiting area - these visitors may rotate.   Pre-op nurse will coordinate an appropriate time for 1 ADULT support person, who may not rotate, to accompany patient in pre-op.  Children under the age of 35 must have an adult with them who is not the patient and must remain in the main waiting area with an adult.  If the patient needs to stay at the  hospital during part of their recovery, the visitor guidelines for inpatient rooms apply.  Please refer to the [facility] website for the visitor guidelines for any additional information.   If you received a COVID test during your pre-op visit  it is requested that you wear a mask when out in public, stay away from anyone that may not be feeling well and notify your surgeon if you develop symptoms. If you have been in contact with anyone that has tested positive in the last 10 days please notify you surgeon.      Pre-operative CHG Bathing Instructions   You can play a key role in reducing the risk of infection after surgery. Your skin needs to be as free of germs as possible. You can reduce the number of germs on your skin by washing with CHG (chlorhexidine gluconate) soap before surgery. CHG is an antiseptic soap that kills germs and continues to kill germs even after washing.   DO NOT use if you have an allergy to chlorhexidine/CHG or antibacterial soaps. If your skin becomes reddened or irritated, stop using the CHG and notify one of our RNs at [phone].              TAKE A SHOWER THE NIGHT BEFORE SURGERY AND THE DAY OF SURGERY    Please keep in mind the following:  DO NOT shave, including legs and underarms, 48  hours prior to surgery.   You may shave your face before/day of surgery.  Place clean sheets on your bed the night before surgery Use a clean washcloth (not used since being washed) for each shower. DO NOT sleep with pet's night before surgery.  CHG Shower Instructions:  Wash your face and private area with normal soap. If you choose to wash your hair, wash first with your normal shampoo.  After you use shampoo/soap, rinse your hair and body thoroughly to remove shampoo/soap residue.  Turn the water OFF and apply half the bottle of CHG soap to a CLEAN washcloth.  Apply CHG soap ONLY FROM YOUR NECK DOWN TO YOUR TOES (washing for 3-5 minutes)  DO NOT use CHG soap on face,  private areas, open wounds, or sores.  Pay special attention to the area where your surgery is being performed.  If you are having back surgery, having someone wash your back for you may be helpful. Wait 2 minutes after CHG soap is applied, then you may rinse off the CHG soap.  Pat dry with a clean towel  Put on clean pajamas    Additional instructions for the day of surgery: DO NOT APPLY any lotions, deodorants or perfumes.   Do not wear jewelry or makeup Do not wear nail polish, gel polish, artificial nails, or any other type of covering on natural nails (fingers and toes) Do not bring valuables to the hospital. [facility] is not responsible for valuables/personal belongings. Put on clean/comfortable clothes.  Please brush your teeth.  Ask your nurse before applying any prescription medications to the skin.

## 2023-11-25 ENCOUNTER — Ambulatory Visit (HOSPITAL_BASED_OUTPATIENT_CLINIC_OR_DEPARTMENT_OTHER)
Admission: RE | Admit: 2023-11-25 | Discharge: 2023-11-25 | Disposition: A | Discharge status: Home / Self Care | Payer: PPO | Source: Ambulatory Visit | Attending: Thoracic Surgery (Cardiothoracic Vascular Surgery) | Admitting: Thoracic Surgery (Cardiothoracic Vascular Surgery)

## 2023-11-25 ENCOUNTER — Other Ambulatory Visit: Payer: Self-pay

## 2023-11-25 ENCOUNTER — Encounter (HOSPITAL_COMMUNITY): Payer: Self-pay

## 2023-11-25 ENCOUNTER — Inpatient Hospital Stay (HOSPITAL_COMMUNITY)
Admission: RE | Admit: 2023-11-25 | Discharge: 2023-11-25 | Disposition: A | Discharge status: Home / Self Care | Payer: PPO | Source: Ambulatory Visit | Attending: Thoracic Surgery (Cardiothoracic Vascular Surgery)

## 2023-11-25 ENCOUNTER — Ambulatory Visit (HOSPITAL_COMMUNITY)
Admission: RE | Admit: 2023-11-25 | Discharge: 2023-11-25 | Disposition: A | Discharge status: Home / Self Care | Payer: PPO | Source: Ambulatory Visit | Attending: Thoracic Surgery (Cardiothoracic Vascular Surgery) | Admitting: Thoracic Surgery (Cardiothoracic Vascular Surgery)

## 2023-11-25 VITALS — BP 130/85 | HR 88 | Temp 98.3°F | Resp 18 | Ht 63.0 in | Wt 187.3 lb

## 2023-11-25 DIAGNOSIS — Z01818 Encounter for other preprocedural examination: Secondary | ICD-10-CM

## 2023-11-25 DIAGNOSIS — I071 Rheumatic tricuspid insufficiency: Secondary | ICD-10-CM

## 2023-11-25 DIAGNOSIS — I340 Nonrheumatic mitral (valve) insufficiency: Secondary | ICD-10-CM

## 2023-11-25 DIAGNOSIS — I130 Hypertensive heart and chronic kidney disease with heart failure and stage 1 through stage 4 chronic kidney disease, or unspecified chronic kidney disease: Secondary | ICD-10-CM | POA: Diagnosis not present

## 2023-11-25 HISTORY — DX: Chronic kidney disease, unspecified: N18.9

## 2023-11-25 HISTORY — DX: Cardiac murmur, unspecified: R01.1

## 2023-11-25 HISTORY — DX: Nontoxic multinodular goiter: E04.2

## 2023-11-25 LAB — CBC
HCT: 36.8 % (ref 36.0–46.0)
Hemoglobin: 11.3 g/dL — ABNORMAL LOW (ref 12.0–15.0)
MCH: 28.7 pg (ref 26.0–34.0)
MCHC: 30.7 g/dL (ref 30.0–36.0)
MCV: 93.4 fL (ref 80.0–100.0)
Platelets: 199 10*3/uL (ref 150–400)
RBC: 3.94 MIL/uL (ref 3.87–5.11)
RDW: 13.7 % (ref 11.5–15.5)
WBC: 5.7 10*3/uL (ref 4.0–10.5)
nRBC: 0 % (ref 0.0–0.2)

## 2023-11-25 LAB — COMPREHENSIVE METABOLIC PANEL
ALT: 11 U/L (ref 0–44)
AST: 16 U/L (ref 15–41)
Albumin: 3.6 g/dL (ref 3.5–5.0)
Alkaline Phosphatase: 36 U/L — ABNORMAL LOW (ref 38–126)
Anion gap: 11 (ref 5–15)
BUN: 46 mg/dL — ABNORMAL HIGH (ref 8–23)
CO2: 23 mmol/L (ref 22–32)
Calcium: 9.1 mg/dL (ref 8.9–10.3)
Chloride: 105 mmol/L (ref 98–111)
Creatinine, Ser: 1.52 mg/dL — ABNORMAL HIGH (ref 0.44–1.00)
GFR, Estimated: 36 mL/min — ABNORMAL LOW (ref >60)
Glucose, Bld: 87 mg/dL (ref 70–99)
Potassium: 3.8 mmol/L (ref 3.5–5.1)
Sodium: 139 mmol/L (ref 135–145)
Total Bilirubin: 2.1 mg/dL — ABNORMAL HIGH (ref <1.2)
Total Protein: 7.6 g/dL (ref 6.5–8.1)

## 2023-11-25 LAB — SURGICAL PCR SCREEN
MRSA, PCR: NEGATIVE
Staphylococcus aureus: NEGATIVE

## 2023-11-25 LAB — URINALYSIS, ROUTINE W REFLEX MICROSCOPIC
Bilirubin Urine: NEGATIVE
Glucose, UA: 150 mg/dL — AB
Hgb urine dipstick: NEGATIVE
Ketones, ur: NEGATIVE mg/dL
Leukocytes,Ua: NEGATIVE
Nitrite: NEGATIVE
Protein, ur: NEGATIVE mg/dL
Specific Gravity, Urine: 1.008 (ref 1.005–1.030)
pH: 5 (ref 5.0–8.0)

## 2023-11-25 LAB — HEMOGLOBIN A1C
Hgb A1c MFr Bld: 6 % — ABNORMAL HIGH (ref 4.8–5.6)
Mean Plasma Glucose: 126 mg/dL

## 2023-11-25 LAB — PROTIME-INR
INR: 1.1 (ref 0.8–1.2)
Prothrombin Time: 14.5 s (ref 11.4–15.2)

## 2023-11-25 LAB — APTT: aPTT: 28 s (ref 24–36)

## 2023-11-25 NOTE — Progress Notes (Addendum)
Carotid arterial duplex completed. Please see CV Procedures for preliminary results.  Shona Simpson, RVT 11/25/23 1:18 PM

## 2023-11-25 NOTE — Progress Notes (Signed)
PCP - Milus Height, PA HF Cardiologist - Dr. Shirlee Latch  PPM/ICD - denies Device Orders - na Rep Notified - na  Chest x-ray - PAT, 11/25/2023 EKG - 11/15/2023 Stress Test -  ECHO - 09/07/2023 Cardiac Cath - 10/12/2023 TAVR: 09/07/2023  Doppler studies: 11/25/2023  Sleep Study - denies CPAP - na  Non-diabetic    GLP1 instructions: Jardiance, hold 72 hours with last dose 11/25/2023  Blood Thinner Instructions: denies Aspirin Instructions: Continue, do not take the day of surgery  ERAS Protcol -NPO  COVID TEST- PAT, 11/25/2023   Anesthesia review: Yes. CHF, HTN, PVC's, mitral valvve replacement  Patient denies shortness of breath, fever, cough and chest pain at PAT appointment   All instructions explained to the patient, with a verbal understanding of the material. Patient agrees to go over the instructions while at home for a better understanding. Patient also instructed to self quarantine after being tested for COVID-19. The opportunity to ask questions was provided.

## 2023-11-26 ENCOUNTER — Encounter (HOSPITAL_COMMUNITY): Payer: Self-pay

## 2023-11-26 MED ORDER — MANNITOL 20 % IV SOLN
INTRAVENOUS | Status: DC
  Filled 2023-11-26: qty 13

## 2023-11-26 MED ORDER — NOREPINEPHRINE 4 MG/250ML-% IV SOLN
0.0000 ug/min | INTRAVENOUS | Status: AC
Start: 2023-11-29 — End: 2023-11-30
  Administered 2023-11-29: 2 ug/min via INTRAVENOUS
  Filled 2023-11-26: qty 250

## 2023-11-26 MED ORDER — TRANEXAMIC ACID (OHS) PUMP PRIME SOLUTION
2.0000 mg/kg | INTRAVENOUS | Status: DC
  Filled 2023-11-26: qty 1.7

## 2023-11-26 MED ORDER — POTASSIUM CHLORIDE 2 MEQ/ML IV SOLN
80.0000 meq | INTRAVENOUS | Status: DC
  Filled 2023-11-26: qty 40

## 2023-11-26 MED ORDER — TRANEXAMIC ACID 1000 MG/10ML IV SOLN
1.5000 mg/kg/h | INTRAVENOUS | Status: AC
  Administered 2023-11-29: 1.5 mg/kg/h via INTRAVENOUS
  Filled 2023-11-26: qty 20

## 2023-11-26 MED ORDER — HEPARIN 30,000 UNITS/1000 ML (OHS) CELLSAVER SOLUTION
Status: DC
  Filled 2023-11-26: qty 1000

## 2023-11-26 MED ORDER — DEXMEDETOMIDINE HCL IN NACL 400 MCG/100ML IV SOLN
0.1000 ug/kg/h | INTRAVENOUS | Status: AC
  Administered 2023-11-29: .7 ug/kg/h via INTRAVENOUS
  Filled 2023-11-26: qty 100

## 2023-11-26 MED ORDER — TRANEXAMIC ACID (OHS) BOLUS VIA INFUSION
15.0000 mg/kg | INTRAVENOUS | Status: AC
  Administered 2023-11-29: 1275 mg via INTRAVENOUS
  Filled 2023-11-26: qty 1275

## 2023-11-26 MED ORDER — CEFAZOLIN SODIUM-DEXTROSE 2-4 GM/100ML-% IV SOLN
2.0000 g | INTRAVENOUS | Status: AC
  Administered 2023-11-29: 2 g via INTRAVENOUS
  Filled 2023-11-26: qty 100

## 2023-11-26 MED ORDER — VANCOMYCIN HCL 1000 MG IV SOLR
INTRAVENOUS | Status: DC
  Filled 2023-11-26: qty 20

## 2023-11-26 MED ORDER — INSULIN REGULAR(HUMAN) IN NACL 100-0.9 UT/100ML-% IV SOLN
INTRAVENOUS | Status: AC
  Administered 2023-11-29: .7 [IU]/h via INTRAVENOUS
  Filled 2023-11-26: qty 100

## 2023-11-26 MED ORDER — NITROGLYCERIN IN D5W 200-5 MCG/ML-% IV SOLN
2.0000 ug/min | INTRAVENOUS | Status: DC
  Filled 2023-11-26: qty 250

## 2023-11-26 MED ORDER — VANCOMYCIN HCL 1.5 G IV SOLR
1500.0000 mg | INTRAVENOUS | Status: AC
  Administered 2023-11-29: 1500 mg via INTRAVENOUS
  Filled 2023-11-26: qty 30

## 2023-11-26 MED ORDER — PLASMA-LYTE A IV SOLN
INTRAVENOUS | Status: DC
  Filled 2023-11-26: qty 2.5

## 2023-11-26 MED ORDER — PHENYLEPHRINE HCL-NACL 20-0.9 MG/250ML-% IV SOLN
30.0000 ug/min | INTRAVENOUS | Status: AC
  Administered 2023-11-29: 25 ug/min via INTRAVENOUS
  Filled 2023-11-26: qty 250

## 2023-11-26 MED ORDER — EPINEPHRINE HCL 5 MG/250ML IV SOLN IN NS
0.0000 ug/min | INTRAVENOUS | Status: AC
  Administered 2023-11-29: 2 ug/min via INTRAVENOUS
  Filled 2023-11-26: qty 250

## 2023-11-26 MED ORDER — MILRINONE LACTATE IN DEXTROSE 20-5 MG/100ML-% IV SOLN
0.3000 ug/kg/min | INTRAVENOUS | Status: AC
  Administered 2023-11-29: .25 ug/kg/min via INTRAVENOUS
  Filled 2023-11-26: qty 100

## 2023-11-26 NOTE — Progress Notes (Signed)
Anesthesia Chart Review:  Case: [id number] Date/Time: 11/29/23 0715   Procedures:      MITRAL VALVE (MV) REPLACEMENT (Chest)     TRICUSPID VALVE REPAIR     TRANSESOPHAGEAL ECHOCARDIOGRAM   Anesthesia type: General   Pre-op diagnosis:      MR     TR   Location: [facility] ROOM 15 / [facility]   Surgeons: Eugenio Hoes, MD       DISCUSSION: Patient is a 72 year old female scheduled for the above procedure.  History includes never smoker, HTN, nonischemic cardiomyopathy (diagnosed 2003, unclear etiology; no CAD, EF 25-30% 12/16/01; declined cMRI due to claustrophobia), chronic systolic CHF, murmur (severe MR s/p MitraClip 12/14/18; recurrent severe MR %% mild-moderate TR 09/07/23 TEE), pulmonary hypertension (10/2023 RHC with "moderate mixed pulmonary arterial and pulmonary venous hypertension, likely due to mitral valve regurgitation"), PVCs (12% PVCs 07/2014), CKD, multinodular goiter (benign left thyroid FNA 07/25/15; previously on methimazole 12/2021-03/2022, normal TSH 0.56 on 05/26/23).  Reported last Jardiance 11/25/2023.  Anesthesia team to evaluate on the day of surgery.  ABG on arrival. No COVID-19 test results from PAT, so will need to be done day of surgery as indicated if not already repeated.  Recent CXR and chest CT reports are still in process.    VS: BP 130/85   Pulse 88   Temp 36.8 C   Resp 18   Ht 5\' 3"  (1.6 m)   Wt 85 kg   SpO2 99%   BMI 33.18 kg/m    PROVIDERS: Milus Height, PA is PCP Marca Ancona, MD is HF cardiologist Zetta Bills, MD is nephrologist Reather Littler, MD is endocrinologist   LABS: Preoperative labs noted. BUN 46, Cr 1.52, eGFR 36. Results appear consistent, slightly improved for previously labs from September and December 2024.  (all labs ordered are listed, but only abnormal results are displayed)  Labs Reviewed  CBC - Abnormal; Notable for the following components:      Result Value   Hemoglobin 11.3 (*)    All other components within normal limits   COMPREHENSIVE METABOLIC PANEL - Abnormal; Notable for the following components:   BUN 46 (*)    Creatinine, Ser 1.52 (*)    Alkaline Phosphatase 36 (*)    Total Bilirubin 2.1 (*)    GFR, Estimated 36 (*)    All other components within normal limits  URINALYSIS, ROUTINE W REFLEX MICROSCOPIC - Abnormal; Notable for the following components:   Glucose, UA 150 (*)    All other components within normal limits  HEMOGLOBIN A1C - Abnormal; Notable for the following components:   Hgb A1c MFr Bld 6.0 (*)    All other components within normal limits  SURGICAL PCR SCREEN  PROTIME-INR  APTT  TYPE AND SCREEN    IMAGES: CXR 11/26/23: In process.   CT Chest 11/15/23: In process.  US Renal 08/12/23: IMPRESSION: No acute abnormality.  US Thyroid 11/26/21: IMPRESSION: 1. Subtle interval enlargement of previously biopsied mass occupying the majority of the left inferior gland. Recommend correlation with prior biopsy results. 2. Greater than 5 year stability of additional scattered thyroid nodules in the isthmus and right gland consistent with benignity. - The above is in keeping with the ACR TI-RADS recommendations - J Am Coll Radiol 2017;14:587-595.    EKG: 11/15/23: Normal sinus rhythm Non-specific intra-ventricular conduction delay Minimal voltage criteria for LVH, may be normal variant ( Sokolow-Lyon ) T wave abnormality, consider inferolateral ischemia Abnormal ECG When compared with ECG of 22-Sep-2023 08:36,  Premature ventricular complexes are no longer Present Confirmed by Julien Nordmann [phone]) on 11/16/2023 6:42:49 PM   CV: US Carotid 11/25/23: Summary:  - Right Carotid: Velocities in the right ICA are consistent with a 1-39%  stenosis.  - Left Carotid: The extracranial vessels were near-normal with only minimal  wall thickening or plaque.  - Vertebrals:  Bilateral vertebral arteries demonstrate antegrade flow.  - Subclavians: Normal flow hemodynamics were seen in  bilateral subclavian arteries.    RHC/LHC 10/12/23: 1. Normal RA pressure, mildly elevated PCWP.  2. Moderate mixed pulmonary arterial and pulmonary venous hypertension likely due to mitral valve regurgitation.  3. Preserved cardiac output and PAPi.  4. No significant coronary disease noted.  - 25 cc contrast used, kept minimal due to CKD.    TEE 09/07/23: IMPRESSIONS   1. Left ventricular ejection fraction, by estimation, is 40 to 45%. The  left ventricle has mildly decreased function. The left ventricle  demonstrates global hypokinesis. The left ventricular internal cavity size  was mildly dilated.   2. Right ventricular systolic function is moderately reduced. The right  ventricular size is mildly enlarged.   3. Left atrial size was severely dilated. No left atrial/left atrial  appendage thrombus was detected.   4. Right atrial size was mildly dilated.   5. Suspect PFO present by color doppler. Unable to do bubble study as we  had to end the study due to hypotension.   6. The patient is status post placement of 2 Mitraclips oriented at A2P2.  MVA planimetered in short axis 4.19 cm^2. Mitral valve mean gradient 4  mmHg. Pulmonary vein systolic doppler signal was flattened but there was  no pulmonary vein systolic doppler  flow reversal in the 4 veins sampled. There was severe, eccentric residual  mitral regurgitation directed along the posterolateral atrial wall. The  mitral regurgitation appears to originate posteriorly from between the two  Mitraclips. Vena contracta area  0.68 cm^2, PISA ERO 0.54 cm^2. Of note, BP was low during this study  (80/54 towards the end) due to sedation (BP at normal resting state  signifantly higher).   7. Peak RV-RA gradient 63 mmHg. The tricuspid valve is abnormal.  Tricuspid valve regurgitation is mild to moderate.   8. The aortic valve is tricuspid. Aortic valve regurgitation is not  visualized. No aortic stenosis is present.    Past  Medical History:  Diagnosis Date   CHF (congestive heart failure) (HCC)    CKD (chronic kidney disease)    Heart murmur    Hypertension    Moderate mitral regurgitation    moderate to severe MR with moderate pulmonary HTN   Multinodular goiter    Nonischemic cardiomyopathy (HCC)    EF 30-35% by echo 2015   Pulmonary HTN (HCC)    moderate with PASP by echo 2015   PVC's (premature ventricular contractions)     Past Surgical History:  Procedure Laterality Date   CARDIAC CATHETERIZATION  2003   normal coronary arteries   CARDIAC CATHETERIZATION  2013  Nov   h/o nonischemic DCM EF 35-40% increase LVEDP    HYSTEROSCOPY WITH D & C  12/02/2012   Procedure: DILATATION AND CURETTAGE /HYSTEROSCOPY;  Surgeon: Dorien Chihuahua. Richardson Dopp, MD;  Location: [facility]S;  Service: Gynecology;  Laterality: N/A;  cervical polypectomy   LEFT AND RIGHT HEART CATHETERIZATION WITH CORONARY ANGIOGRAM N/A 02/15/2015   Procedure: LEFT AND RIGHT HEART CATHETERIZATION WITH CORONARY ANGIOGRAM;  Surgeon: Corky Crafts, MD;  Location: [facility] CATH  LAB;  Service: Cardiovascular;  Laterality: N/A;   RIGHT/LEFT HEART CATH AND CORONARY ANGIOGRAPHY N/A 10/12/2023   Procedure: RIGHT/LEFT HEART CATH AND CORONARY ANGIOGRAPHY;  Surgeon: Laurey Morale, MD;  Location: [facility] INVASIVE CV LAB;  Service: Cardiovascular;  Laterality: N/A;   TEE WITHOUT CARDIOVERSION N/A 10/11/2018   Procedure: TRANSESOPHAGEAL ECHOCARDIOGRAM (TEE);  Surgeon: Laurey Morale, MD;  Location: [facility] ENDOSCOPY;  Service: Cardiovascular;  Laterality: N/A;   TEE WITHOUT CARDIOVERSION N/A 09/07/2023   Procedure: TRANSESOPHAGEAL ECHOCARDIOGRAM;  Surgeon: Laurey Morale, MD;  Location: [facility] INVASIVE CV LAB;  Service: Cardiovascular;  Laterality: N/A;   TRANSCATHETER MITRAL EDGE TO EDGE REPAIR N/A 12/14/2018   Procedure: MITRAL VALVE REPAIR;  Surgeon: Tonny Bollman, MD;  Location: [facility] INVASIVE CV LAB;  Service: Cardiovascular;  Laterality: N/A;    MEDICATIONS:  aspirin 81  MG EC tablet   Calcium Carb-Cholecalciferol (CALCIUM + VITAMIN D3 PO)   carvedilol (COREG) 6.25 MG tablet   empagliflozin (JARDIANCE) 10 MG TABS tablet   ibandronate (BONIVA) 150 MG tablet   torsemide (DEMADEX) 20 MG tablet   Vericiguat (VERQUVO) 2.5 MG TABS   No current facility-administered medications for this encounter.   Shonna Chock, PA-C Surgical Short Stay/Anesthesiology [facility] Phone [phone] [facility] Phone [phone] 11/26/2023 10:11 AM

## 2023-11-26 NOTE — Anesthesia Preprocedure Evaluation (Signed)
Anesthesia Evaluation  Patient identified by MRN, date of birth, ID band Patient awake    Reviewed: Allergy & Precautions, NPO status , Patient's Chart, lab work & pertinent test results, reviewed documented beta blocker date and time   History of Anesthesia Complications Negative for: history of anesthetic complications  Airway Mallampati: III  TM Distance: >3 FB Neck ROM: Limited    Dental no notable dental hx.    Pulmonary    breath sounds clear to auscultation       Cardiovascular hypertension, +CHF  (-) CAD (clean coronaries) + Valvular Problems/Murmurs MR  Rhythm:Regular Rate:Normal  Mildly reduced LVEF, moderately reduced RV function   Neuro/Psych neg Seizures    GI/Hepatic ,neg GERD  ,,(+) neg Cirrhosis        Endo/Other   Hyperthyroidism   Renal/GU CRFRenal disease     Musculoskeletal   Abdominal   Peds  Hematology  (+) Blood dyscrasia, anemia   Anesthesia Other Findings   Reproductive/Obstetrics                             Anesthesia Physical Anesthesia Plan  ASA: 4  Anesthesia Plan: General   Post-op Pain Management:    Induction: Intravenous  PONV Risk Score and Plan: 2 and Ondansetron and Propofol infusion  Airway Management Planned: Oral ETT and Video Laryngoscope Planned  Additional Equipment: Arterial line, CVP, PA Cath, 3D TEE and Ultrasound Guidance Line Placement  Intra-op Plan:   Post-operative Plan: Post-operative intubation/ventilation  Informed Consent: I have reviewed the patients History and Physical, chart, labs and discussed the procedure including the risks, benefits and alternatives for the proposed anesthesia with the patient or authorized representative who has indicated his/her understanding and acceptance.     Dental advisory given  Plan Discussed with: CRNA  Anesthesia Plan Comments: (PAT note written 11/26/2023 by Shonna Chock,  PA-C.  )       Anesthesia Quick Evaluation

## 2023-11-28 NOTE — H&P (Signed)
[address]       [place] [address]             [phone]                                   Vanessa [name] [facility] Medical Record #[id number] Date of Birth: 1951/10/15   Milus Height, PA Milus Height, [place]   Chief Complaint:  DOE    History of Present Illness:     Pt is a very pleasant 72 yo female who has had a long history of mv disease. Pt was found to have non ischemic cardiomyopathy and severe MR and underwent Mitral Clip x2 in 2020. Pt over the years has had follow up with increasing MR and EF 40-45%. She has been suffering from exertional DOE mostly with climbing stairs and on inclines. She has lower ext edema when she becomes fluid overloaded and has been on GDMT adjustments due to side effects. Recently with increasing DOE she underwent TEE to determine if additional Mitral clip placement would be an option and unfortunately felt not to be a candidate. She underwent cath with no CAD but with moderate PHTN. She has not had atrial fibrillation issues. Pt very anxious to proceed as soon as possible to "get this behind her"             Past Medical History:  Diagnosis Date   CHF (congestive heart failure) (HCC)     Dysrhythmia      PVC's   Hypertension     Moderate mitral regurgitation      moderate to severe MR with moderate pulmonary HTN   Nonischemic cardiomyopathy (HCC)      EF 30-35% by echo 2015   Pulmonary HTN (HCC)      moderate with PASP by echo 2015   PVC's (premature ventricular contractions)                 Past Surgical History:  Procedure Laterality Date   CARDIAC CATHETERIZATION   2003    normal coronary arteries   CARDIAC CATHETERIZATION   2013  Nov    h/o nonischemic DCM EF 35-40% increase LVEDP    HYSTEROSCOPY WITH D & C   12/02/2012    Procedure: DILATATION AND CURETTAGE /HYSTEROSCOPY;  Surgeon: Dorien Chihuahua. Richardson Dopp, MD;  Location: [facility];  Service: Gynecology;  Laterality: N/A;  cervical polypectomy   LEFT  AND RIGHT HEART CATHETERIZATION WITH CORONARY ANGIOGRAM N/A 02/15/2015    Procedure: LEFT AND RIGHT HEART CATHETERIZATION WITH CORONARY ANGIOGRAM;  Surgeon: Corky Crafts, MD;  Location: [facility] CATH LAB;  Service: Cardiovascular;  Laterality: N/A;   RIGHT/LEFT HEART CATH AND CORONARY ANGIOGRAPHY N/A 10/12/2023    Procedure: RIGHT/LEFT HEART CATH AND CORONARY ANGIOGRAPHY;  Surgeon: Laurey Morale, MD;  Location: [facility] INVASIVE CV LAB;  Service: Cardiovascular;  Laterality: N/A;   TEE WITHOUT CARDIOVERSION N/A 10/11/2018    Procedure: TRANSESOPHAGEAL ECHOCARDIOGRAM (TEE);  Surgeon: Laurey Morale, MD;  Location: [facility] ENDOSCOPY;  Service: Cardiovascular;  Laterality: N/A;   TEE WITHOUT CARDIOVERSION N/A 09/07/2023    Procedure: TRANSESOPHAGEAL ECHOCARDIOGRAM;  Surgeon: Laurey Morale, MD;  Location: [facility] INVASIVE CV LAB;  Service: Cardiovascular;  Laterality: N/A;   TRANSCATHETER MITRAL EDGE TO EDGE REPAIR N/A 12/14/2018    Procedure: MITRAL VALVE REPAIR;  Surgeon: Tonny Bollman, MD;  Location: [facility] INVASIVE  CV LAB;  Service: Cardiovascular;  Laterality: N/A;          Tobacco Use History  Social History       Tobacco Use  Smoking Status Never  Smokeless Tobacco Never      Social History        Substance and Sexual Activity  Alcohol Use No   Alcohol/week: 0.0 standard drinks of alcohol      Social History         Socioeconomic History   Marital status: Single      Spouse name: Not on file   Number of children: Not on file   Years of education: Not on file   Highest education level: Not on file  Occupational History   Occupation: Retired      Comment: Textile, [employer]  Tobacco Use   Smoking status: Never   Smokeless tobacco: Never  Vaping Use   Vaping status: Never Used  Substance and Sexual Activity   Alcohol use: No      Alcohol/week: 0.0 standard drinks of alcohol   Drug use: No   Sexual activity: Not on file  Other Topics Concern   Not on file   Social History Narrative   Not on file    Social Determinants of Health    Financial Resource Strain: Not on file  Food Insecurity: Not on file  Transportation Needs: Not on file  Physical Activity: Not on file  Stress: Not on file  Social Connections: Not on file  Intimate Partner Violence: Not on file      Allergies  No Known Allergies           Current Outpatient Medications  Medication Sig Dispense Refill   aspirin 81 MG EC tablet Take 1 tablet (81 mg total) by mouth daily.       Calcium Carb-Cholecalciferol (CALCIUM + VITAMIN D3 PO) Take 1 tablet by mouth in the morning.       carvedilol (COREG) 6.25 MG tablet Take 1 tablet by mouth twice daily 180 tablet 0   empagliflozin (JARDIANCE) 10 MG TABS tablet TAKE 1 TABLET BY MOUTH ONCE DAILY BEFORE BREAKFAST 90 tablet 3   ibandronate (BONIVA) 150 MG tablet Take 150 mg by mouth every 30 (thirty) days.       torsemide (DEMADEX) 20 MG tablet TAKE 2 TABLETS(40 MG) ALTERNATING WITH 1 TABLET (20 MG). (2 TABS ONCE DAILY, 1 TAB THE NEXT DAY). 90 tablet 0   Vericiguat (VERQUVO) 2.5 MG TABS Take 1 tablet (2.5 mg total) by mouth daily. 30 tablet 11      No current facility-administered medications for this visit.               Family History  Problem Relation Age of Onset   Breast cancer Mother 77   Heart disease Father     Breast cancer Sister 65   Colon cancer Neg Hx     Esophageal cancer Neg Hx     Rectal cancer Neg Hx     Stomach cancer Neg Hx     Thyroid disease Neg Hx                  Physical Exam: Teeth in good repair Lungs; clear Card: RR with 3/6 sem Ext: mild edema Neuro: intact         Diagnostic Studies & Laboratory data: I have personally reviewed the following studies and agree with the findings   TEE (08/2023) IMPRESSIONS  1. Left ventricular ejection fraction, by estimation, is 40 to 45%. The  left ventricle has mildly decreased function. The left ventricle  demonstrates global  hypokinesis. The left ventricular internal cavity size  was mildly dilated.   2. Right ventricular systolic function is moderately reduced. The right  ventricular size is mildly enlarged.   3. Left atrial size was severely dilated. No left atrial/left atrial  appendage thrombus was detected.   4. Right atrial size was mildly dilated.   5. Suspect PFO present by color doppler. Unable to do bubble study as we  had to end the study due to hypotension.   6. The patient is status post placement of 2 Mitraclips oriented at A2P2.  MVA planimetered in short axis 4.19 cm^2. Mitral valve mean gradient 4  mmHg. Pulmonary vein systolic doppler signal was flattened but there was  no pulmonary vein systolic doppler  flow reversal in the 4 veins sampled. There was severe, eccentric residual  mitral regurgitation directed along the posterolateral atrial wall. The  mitral regurgitation appears to originate posteriorly from between the two  Mitraclips. Vena contracta area  0.68 cm^2, PISA ERO 0.54 cm^2. Of note, BP was low during this study  (80/54 towards the end) due to sedation (BP at normal resting state  signifantly higher).   7. Peak RV-RA gradient 63 mmHg. The tricuspid valve is abnormal.  Tricuspid valve regurgitation is mild to moderate.   8. The aortic valve is tricuspid. Aortic valve regurgitation is not  visualized. No aortic stenosis is present.   FINDINGS   Left Ventricle: Left ventricular ejection fraction, by estimation, is 40  to 45%. The left ventricle has mildly decreased function. The left  ventricle demonstrates global hypokinesis. The left ventricular internal  cavity size was mildly dilated. There is   no left ventricular hypertrophy.   Right Ventricle: The right ventricular size is mildly enlarged. No  increase in right ventricular wall thickness. Right ventricular systolic  function is moderately reduced.   Left Atrium: Left atrial size was severely dilated. No left  atrial/left  atrial appendage thrombus was detected.   Right Atrium: Right atrial size was mildly dilated.   Pericardium: There is no evidence of pericardial effusion.   Mitral Valve: MVA planimetered in short axis 4.19 cm^2. Mitral valve mean  gradient 4 mmHg. Pulmonary vein systolic doppler signal was flattened but  there was no pulmonary vein systolic doppler flow reversal in the 4 veins  sampled. The mitral valve has  been repaired/replaced. Severe mitral valve regurgitation. Mild mitral  valve stenosis. MV peak gradient, 7.3 mmHg. The mean mitral valve gradient  is 4.0 mmHg.   Tricuspid Valve: Peak RV-RA gradient 63 mmHg. The tricuspid valve is  abnormal. Tricuspid valve regurgitation is mild to moderate.   Aortic Valve: The aortic valve is tricuspid. Aortic valve regurgitation is  not visualized. No aortic stenosis is present.   Pulmonic Valve: The pulmonic valve was normal in structure. Pulmonic valve  regurgitation is not visualized.   Aorta: The aortic root is normal in size and structure.   IAS/Shunts: Suspect PFO present by color doppler. Unable to do bubble  study as we had to end the study due to hypotension.   Additional Comments: Spectral Doppler performed.   MITRAL VALVE                  TRICUSPID VALVE  MV Peak grad: 7.3 mmHg        TR Peak grad:   63.0  mmHg  MV Mean grad: 4.0 mmHg        TR Vmax:        397.00 cm/s  MV Vmax:      1.35 m/s  MV Vmean:     97.0 cm/s  MR Peak grad:    67.9 mmHg  MR Mean grad:    44.0 mmHg  MR Vmax:         412.00 cm/s  MR Vmean:        315.0 cm/s  MR PISA:         8.31 cm  MR PISA Eff ROA: 72 mm  MR PISA Radius:  1.15 cm         Recent Radiology Findings:    Imaging Results (Last 48 hours)  CARDIAC CATHETERIZATION   Result Date: 10/12/2023 1. Normal RA pressure, mildly elevated PCWP. 2. Moderate mixed pulmonary arterial and pulmonary venous hypertension likely due to mitral valve regurgitation. 3. Preserved cardiac  output and PAPi. 4. No significant coronary disease noted. 25 cc contrast used, kept minimal due to CKD.      Hemo Data   Flowsheet Row Most Recent Value  Fick Cardiac Output 4.67 L/min  Fick Cardiac Output Index 2.52 (L/min)/BSA  RA A Wave 9 mmHg  RA V Wave 6 mmHg  RA Mean 5 mmHg  RV Systolic Pressure 63 mmHg  RV Diastolic Pressure 3 mmHg  RV EDP 8 mmHg  PA Systolic Pressure 61 mmHg  PA Diastolic Pressure 19 mmHg  PA Mean 39 mmHg  PW A Wave 22 mmHg  PW V Wave 21 mmHg  PW Mean 18 mmHg  AO Systolic Pressure 113 mmHg  AO Diastolic Pressure 56 mmHg  AO Mean 79 mmHg  LV Systolic Pressure 100 mmHg  LV Diastolic Pressure 2 mmHg  LV EDP 10 mmHg  AOp Systolic Pressure 114 mmHg  AOp Diastolic Pressure 61 mmHg  AOp Mean Pressure 83 mmHg  LVp Systolic Pressure 96 mmHg  LVp Diastolic Pressure 3 mmHg  LVp EDP Pressure 4 mmHg  QP/QS 1  TPVR Index 15.46 HRUI  TSVR Index 33.69 HRUI  PVR SVR Ratio 0.26  TPVR/TSVR Ratio 0.46      Recent Lab Findings: Recent Labs       Lab Results  Component Value Date    WBC 5.0 10/12/2023    HGB 11.2 (L) 10/12/2023    HGB 10.9 (L) 10/12/2023    HCT 33.0 (L) 10/12/2023    HCT 32.0 (L) 10/12/2023    PLT 188 10/12/2023    GLUCOSE 113 (H) 09/22/2023    CHOL 178 08/11/2019    TRIG 62 08/11/2019    HDL 57 08/11/2019    LDLCALC 109 (H) 08/11/2019    ALT 7 12/13/2018    AST 11 (L) 12/13/2018    NA 140 10/12/2023    NA 139 10/12/2023    K 4.2 10/12/2023    K 4.3 10/12/2023    CL 105 09/22/2023    CREATININE 1.78 (H) 09/22/2023    BUN 44 (H) 09/22/2023    CO2 28 09/22/2023    TSH 0.560 05/26/2023    INR 1.20 12/13/2018    HGBA1C 6.2 (H) 12/13/2018            Assessment / Plan:     72 yo female with NYHA class 2 symptoms of recurrent MR following Mitral clip placement and with depressed LV function and moderate PHTN with no CAD. PT also with moderate TR and will need  to assess TV annular size due to possible need for TV repair with PHTN.  Pt understands will need mv replacement with this surgery and will be with a tissues valve. All the risks, goals and recovery were discussed and she wishes to proceed. Will perform surgery on 12/30

## 2023-11-29 ENCOUNTER — Inpatient Hospital Stay (HOSPITAL_COMMUNITY): Payer: PPO

## 2023-11-29 ENCOUNTER — Encounter (HOSPITAL_COMMUNITY): Payer: Self-pay | Admitting: Thoracic Surgery (Cardiothoracic Vascular Surgery)

## 2023-11-29 ENCOUNTER — Encounter (HOSPITAL_COMMUNITY)
Admission: RE | Disposition: A | Discharge status: Rehabilitation Facility | Payer: Self-pay | Source: Home / Self Care | Attending: Thoracic Surgery (Cardiothoracic Vascular Surgery)

## 2023-11-29 ENCOUNTER — Other Ambulatory Visit: Payer: Self-pay

## 2023-11-29 ENCOUNTER — Inpatient Hospital Stay (HOSPITAL_COMMUNITY)
Admission: RE | Admit: 2023-11-29 | Discharge: 2024-01-12 | DRG: 216 | Disposition: A | Discharge status: Rehabilitation Facility | Payer: PPO | Attending: Internal Medicine | Admitting: Internal Medicine

## 2023-11-29 ENCOUNTER — Inpatient Hospital Stay (HOSPITAL_COMMUNITY): Payer: Self-pay | Admitting: Vascular Surgery

## 2023-11-29 ENCOUNTER — Inpatient Hospital Stay (HOSPITAL_COMMUNITY): Payer: PPO | Admitting: Anesthesiology

## 2023-11-29 ENCOUNTER — Inpatient Hospital Stay (HOSPITAL_COMMUNITY): Payer: Self-pay | Admitting: Anesthesiology

## 2023-11-29 DIAGNOSIS — R110 Nausea: Secondary | ICD-10-CM | POA: Diagnosis not present

## 2023-11-29 DIAGNOSIS — I97611 Postprocedural hemorrhage and hematoma of a circulatory system organ or structure following cardiac bypass: Secondary | ICD-10-CM | POA: Diagnosis not present

## 2023-11-29 DIAGNOSIS — N179 Acute kidney failure, unspecified: Secondary | ICD-10-CM | POA: Diagnosis not present

## 2023-11-29 DIAGNOSIS — D6959 Other secondary thrombocytopenia: Secondary | ICD-10-CM | POA: Diagnosis present

## 2023-11-29 DIAGNOSIS — I3139 Other pericardial effusion (noninflammatory): Secondary | ICD-10-CM | POA: Diagnosis present

## 2023-11-29 DIAGNOSIS — I130 Hypertensive heart and chronic kidney disease with heart failure and stage 1 through stage 4 chronic kidney disease, or unspecified chronic kidney disease: Secondary | ICD-10-CM | POA: Diagnosis not present

## 2023-11-29 DIAGNOSIS — F329 Major depressive disorder, single episode, unspecified: Secondary | ICD-10-CM | POA: Diagnosis present

## 2023-11-29 DIAGNOSIS — E0520 Thyrotoxicosis with toxic multinodular goiter without thyrotoxic crisis or storm: Secondary | ICD-10-CM | POA: Diagnosis present

## 2023-11-29 DIAGNOSIS — J9601 Acute respiratory failure with hypoxia: Secondary | ICD-10-CM | POA: Diagnosis not present

## 2023-11-29 DIAGNOSIS — I361 Nonrheumatic tricuspid (valve) insufficiency: Secondary | ICD-10-CM

## 2023-11-29 DIAGNOSIS — E861 Hypovolemia: Secondary | ICD-10-CM | POA: Diagnosis not present

## 2023-11-29 DIAGNOSIS — G4700 Insomnia, unspecified: Secondary | ICD-10-CM | POA: Diagnosis not present

## 2023-11-29 DIAGNOSIS — F4321 Adjustment disorder with depressed mood: Secondary | ICD-10-CM | POA: Diagnosis present

## 2023-11-29 DIAGNOSIS — I2721 Secondary pulmonary arterial hypertension: Secondary | ICD-10-CM | POA: Diagnosis present

## 2023-11-29 DIAGNOSIS — I959 Hypotension, unspecified: Secondary | ICD-10-CM | POA: Diagnosis not present

## 2023-11-29 DIAGNOSIS — Z6831 Body mass index (BMI) 31.0-31.9, adult: Secondary | ICD-10-CM | POA: Diagnosis not present

## 2023-11-29 DIAGNOSIS — I480 Paroxysmal atrial fibrillation: Secondary | ICD-10-CM | POA: Diagnosis not present

## 2023-11-29 DIAGNOSIS — Z992 Dependence on renal dialysis: Secondary | ICD-10-CM | POA: Diagnosis not present

## 2023-11-29 DIAGNOSIS — I071 Rheumatic tricuspid insufficiency: Secondary | ICD-10-CM | POA: Diagnosis not present

## 2023-11-29 DIAGNOSIS — E43 Unspecified severe protein-calorie malnutrition: Secondary | ICD-10-CM

## 2023-11-29 DIAGNOSIS — I081 Rheumatic disorders of both mitral and tricuspid valves: Principal | ICD-10-CM | POA: Diagnosis present

## 2023-11-29 DIAGNOSIS — I132 Hypertensive heart and chronic kidney disease with heart failure and with stage 5 chronic kidney disease, or end stage renal disease: Secondary | ICD-10-CM | POA: Diagnosis present

## 2023-11-29 DIAGNOSIS — D649 Anemia, unspecified: Secondary | ICD-10-CM

## 2023-11-29 DIAGNOSIS — I442 Atrioventricular block, complete: Secondary | ICD-10-CM | POA: Diagnosis present

## 2023-11-29 DIAGNOSIS — R571 Hypovolemic shock: Secondary | ICD-10-CM | POA: Diagnosis not present

## 2023-11-29 DIAGNOSIS — R579 Shock, unspecified: Secondary | ICD-10-CM | POA: Diagnosis not present

## 2023-11-29 DIAGNOSIS — Z8249 Family history of ischemic heart disease and other diseases of the circulatory system: Secondary | ICD-10-CM

## 2023-11-29 DIAGNOSIS — R570 Cardiogenic shock: Secondary | ICD-10-CM | POA: Diagnosis not present

## 2023-11-29 DIAGNOSIS — R739 Hyperglycemia, unspecified: Secondary | ICD-10-CM | POA: Diagnosis not present

## 2023-11-29 DIAGNOSIS — T8111XA Postprocedural  cardiogenic shock, initial encounter: Secondary | ICD-10-CM | POA: Diagnosis not present

## 2023-11-29 DIAGNOSIS — Z952 Presence of prosthetic heart valve: Principal | ICD-10-CM

## 2023-11-29 DIAGNOSIS — A419 Sepsis, unspecified organism: Secondary | ICD-10-CM | POA: Diagnosis not present

## 2023-11-29 DIAGNOSIS — I4892 Unspecified atrial flutter: Secondary | ICD-10-CM | POA: Diagnosis present

## 2023-11-29 DIAGNOSIS — I5023 Acute on chronic systolic (congestive) heart failure: Secondary | ICD-10-CM | POA: Diagnosis present

## 2023-11-29 DIAGNOSIS — I97618 Postprocedural hemorrhage and hematoma of a circulatory system organ or structure following other circulatory system procedure: Secondary | ICD-10-CM | POA: Diagnosis not present

## 2023-11-29 DIAGNOSIS — D696 Thrombocytopenia, unspecified: Secondary | ICD-10-CM | POA: Diagnosis not present

## 2023-11-29 DIAGNOSIS — R54 Age-related physical debility: Secondary | ICD-10-CM | POA: Diagnosis present

## 2023-11-29 DIAGNOSIS — I950 Idiopathic hypotension: Secondary | ICD-10-CM | POA: Diagnosis not present

## 2023-11-29 DIAGNOSIS — I509 Heart failure, unspecified: Secondary | ICD-10-CM

## 2023-11-29 DIAGNOSIS — I5082 Biventricular heart failure: Secondary | ICD-10-CM | POA: Diagnosis present

## 2023-11-29 DIAGNOSIS — N186 End stage renal disease: Secondary | ICD-10-CM | POA: Diagnosis present

## 2023-11-29 DIAGNOSIS — R7303 Prediabetes: Secondary | ICD-10-CM | POA: Diagnosis present

## 2023-11-29 DIAGNOSIS — N170 Acute kidney failure with tubular necrosis: Secondary | ICD-10-CM | POA: Diagnosis present

## 2023-11-29 DIAGNOSIS — K8020 Calculus of gallbladder without cholecystitis without obstruction: Secondary | ICD-10-CM | POA: Diagnosis present

## 2023-11-29 DIAGNOSIS — I340 Nonrheumatic mitral (valve) insufficiency: Secondary | ICD-10-CM | POA: Diagnosis not present

## 2023-11-29 DIAGNOSIS — N183 Chronic kidney disease, stage 3 unspecified: Secondary | ICD-10-CM | POA: Insufficient documentation

## 2023-11-29 DIAGNOSIS — K5901 Slow transit constipation: Secondary | ICD-10-CM | POA: Diagnosis not present

## 2023-11-29 DIAGNOSIS — I5022 Chronic systolic (congestive) heart failure: Secondary | ICD-10-CM | POA: Diagnosis not present

## 2023-11-29 DIAGNOSIS — K7200 Acute and subacute hepatic failure without coma: Secondary | ICD-10-CM | POA: Diagnosis present

## 2023-11-29 DIAGNOSIS — I420 Dilated cardiomyopathy: Secondary | ICD-10-CM | POA: Diagnosis not present

## 2023-11-29 DIAGNOSIS — F419 Anxiety disorder, unspecified: Secondary | ICD-10-CM | POA: Diagnosis present

## 2023-11-29 DIAGNOSIS — I272 Pulmonary hypertension, unspecified: Secondary | ICD-10-CM

## 2023-11-29 DIAGNOSIS — I493 Ventricular premature depolarization: Secondary | ICD-10-CM | POA: Diagnosis present

## 2023-11-29 DIAGNOSIS — Z7984 Long term (current) use of oral hypoglycemic drugs: Secondary | ICD-10-CM

## 2023-11-29 DIAGNOSIS — N189 Chronic kidney disease, unspecified: Secondary | ICD-10-CM | POA: Diagnosis not present

## 2023-11-29 DIAGNOSIS — E1122 Type 2 diabetes mellitus with diabetic chronic kidney disease: Secondary | ICD-10-CM | POA: Diagnosis not present

## 2023-11-29 DIAGNOSIS — I472 Ventricular tachycardia, unspecified: Secondary | ICD-10-CM | POA: Diagnosis not present

## 2023-11-29 DIAGNOSIS — R5381 Other malaise: Secondary | ICD-10-CM | POA: Diagnosis not present

## 2023-11-29 DIAGNOSIS — L89326 Pressure-induced deep tissue damage of left buttock: Secondary | ICD-10-CM | POA: Diagnosis not present

## 2023-11-29 DIAGNOSIS — Z79899 Other long term (current) drug therapy: Secondary | ICD-10-CM

## 2023-11-29 DIAGNOSIS — K521 Toxic gastroenteritis and colitis: Secondary | ICD-10-CM | POA: Diagnosis not present

## 2023-11-29 DIAGNOSIS — D539 Nutritional anemia, unspecified: Secondary | ICD-10-CM | POA: Diagnosis present

## 2023-11-29 DIAGNOSIS — I4891 Unspecified atrial fibrillation: Secondary | ICD-10-CM | POA: Diagnosis not present

## 2023-11-29 DIAGNOSIS — E871 Hypo-osmolality and hyponatremia: Secondary | ICD-10-CM | POA: Diagnosis present

## 2023-11-29 DIAGNOSIS — D62 Acute posthemorrhagic anemia: Secondary | ICD-10-CM | POA: Diagnosis not present

## 2023-11-29 DIAGNOSIS — J9602 Acute respiratory failure with hypercapnia: Secondary | ICD-10-CM | POA: Diagnosis not present

## 2023-11-29 DIAGNOSIS — E46 Unspecified protein-calorie malnutrition: Secondary | ICD-10-CM | POA: Diagnosis not present

## 2023-11-29 DIAGNOSIS — R578 Other shock: Secondary | ICD-10-CM | POA: Diagnosis not present

## 2023-11-29 DIAGNOSIS — I314 Cardiac tamponade: Secondary | ICD-10-CM | POA: Diagnosis not present

## 2023-11-29 DIAGNOSIS — I110 Hypertensive heart disease with heart failure: Secondary | ICD-10-CM

## 2023-11-29 DIAGNOSIS — Z23 Encounter for immunization: Secondary | ICD-10-CM

## 2023-11-29 DIAGNOSIS — R197 Diarrhea, unspecified: Secondary | ICD-10-CM | POA: Diagnosis not present

## 2023-11-29 DIAGNOSIS — I2722 Pulmonary hypertension due to left heart disease: Secondary | ICD-10-CM | POA: Diagnosis present

## 2023-11-29 DIAGNOSIS — D631 Anemia in chronic kidney disease: Secondary | ICD-10-CM | POA: Diagnosis not present

## 2023-11-29 DIAGNOSIS — E8729 Other acidosis: Secondary | ICD-10-CM | POA: Diagnosis present

## 2023-11-29 DIAGNOSIS — E875 Hyperkalemia: Secondary | ICD-10-CM | POA: Diagnosis not present

## 2023-11-29 DIAGNOSIS — Z7982 Long term (current) use of aspirin: Secondary | ICD-10-CM

## 2023-11-29 DIAGNOSIS — J810 Acute pulmonary edema: Secondary | ICD-10-CM | POA: Diagnosis not present

## 2023-11-29 DIAGNOSIS — Z803 Family history of malignant neoplasm of breast: Secondary | ICD-10-CM

## 2023-11-29 DIAGNOSIS — I428 Other cardiomyopathies: Secondary | ICD-10-CM | POA: Diagnosis present

## 2023-11-29 DIAGNOSIS — Z7901 Long term (current) use of anticoagulants: Secondary | ICD-10-CM

## 2023-11-29 DIAGNOSIS — R6521 Severe sepsis with septic shock: Secondary | ICD-10-CM | POA: Diagnosis not present

## 2023-11-29 DIAGNOSIS — T368X5A Adverse effect of other systemic antibiotics, initial encounter: Secondary | ICD-10-CM | POA: Diagnosis not present

## 2023-11-29 HISTORY — PX: EXPLORATION POST OPERATIVE OPEN HEART: SHX5061

## 2023-11-29 HISTORY — PX: MITRAL VALVE REPLACEMENT: SHX147

## 2023-11-29 HISTORY — PX: TRICUSPID VALVE REPLACEMENT: SHX816

## 2023-11-29 HISTORY — PX: TEE WITHOUT CARDIOVERSION: SHX5443

## 2023-11-29 LAB — POCT I-STAT, CHEM 8
BUN: 38 mg/dL — ABNORMAL HIGH (ref 8–23)
BUN: 37 mg/dL — ABNORMAL HIGH (ref 8–23)
BUN: 37 mg/dL — ABNORMAL HIGH (ref 8–23)
BUN: 40 mg/dL — ABNORMAL HIGH (ref 8–23)
BUN: 39 mg/dL — ABNORMAL HIGH (ref 8–23)
Calcium, Ion: 1.16 mmol/L (ref 1.15–1.40)
Calcium, Ion: 1.14 mmol/L — ABNORMAL LOW (ref 1.15–1.40)
Calcium, Ion: 1.04 mmol/L — ABNORMAL LOW (ref 1.15–1.40)
Calcium, Ion: 1.04 mmol/L — ABNORMAL LOW (ref 1.15–1.40)
Calcium, Ion: 1.02 mmol/L — ABNORMAL LOW (ref 1.15–1.40)
Chloride: 106 mmol/L (ref 98–111)
Chloride: 105 mmol/L (ref 98–111)
Chloride: 104 mmol/L (ref 98–111)
Chloride: 106 mmol/L (ref 98–111)
Chloride: 106 mmol/L (ref 98–111)
Creatinine, Ser: 1.6 mg/dL — ABNORMAL HIGH (ref 0.44–1.00)
Creatinine, Ser: 1.4 mg/dL — ABNORMAL HIGH (ref 0.44–1.00)
Creatinine, Ser: 1.4 mg/dL — ABNORMAL HIGH (ref 0.44–1.00)
Creatinine, Ser: 1.4 mg/dL — ABNORMAL HIGH (ref 0.44–1.00)
Creatinine, Ser: 1.4 mg/dL — ABNORMAL HIGH (ref 0.44–1.00)
Glucose, Bld: 110 mg/dL — ABNORMAL HIGH (ref 70–99)
Glucose, Bld: 115 mg/dL — ABNORMAL HIGH (ref 70–99)
Glucose, Bld: 123 mg/dL — ABNORMAL HIGH (ref 70–99)
Glucose, Bld: 149 mg/dL — ABNORMAL HIGH (ref 70–99)
Glucose, Bld: 204 mg/dL — ABNORMAL HIGH (ref 70–99)
HCT: 28 % — ABNORMAL LOW (ref 36.0–46.0)
HCT: 26 % — ABNORMAL LOW (ref 36.0–46.0)
HCT: 23 % — ABNORMAL LOW (ref 36.0–46.0)
HCT: 23 % — ABNORMAL LOW (ref 36.0–46.0)
HCT: 25 % — ABNORMAL LOW (ref 36.0–46.0)
Hemoglobin: 9.5 g/dL — ABNORMAL LOW (ref 12.0–15.0)
Hemoglobin: 8.8 g/dL — ABNORMAL LOW (ref 12.0–15.0)
Hemoglobin: 7.8 g/dL — ABNORMAL LOW (ref 12.0–15.0)
Hemoglobin: 7.8 g/dL — ABNORMAL LOW (ref 12.0–15.0)
Hemoglobin: 8.5 g/dL — ABNORMAL LOW (ref 12.0–15.0)
Potassium: 4.1 mmol/L (ref 3.5–5.1)
Potassium: 3.7 mmol/L (ref 3.5–5.1)
Potassium: 4 mmol/L (ref 3.5–5.1)
Potassium: 4.5 mmol/L (ref 3.5–5.1)
Potassium: 4.7 mmol/L (ref 3.5–5.1)
Sodium: 139 mmol/L (ref 135–145)
Sodium: 139 mmol/L (ref 135–145)
Sodium: 139 mmol/L (ref 135–145)
Sodium: 138 mmol/L (ref 135–145)
Sodium: 138 mmol/L (ref 135–145)
TCO2: 23 mmol/L (ref 22–32)
TCO2: 23 mmol/L (ref 22–32)
TCO2: 26 mmol/L (ref 22–32)
TCO2: 25 mmol/L (ref 22–32)
TCO2: 23 mmol/L (ref 22–32)

## 2023-11-29 LAB — POCT I-STAT 7, (LYTES, BLD GAS, ICA,H+H)
Acid-base deficit: 2 mmol/L (ref 0.0–2.0)
Acid-base deficit: 1 mmol/L (ref 0.0–2.0)
Acid-base deficit: 3 mmol/L — ABNORMAL HIGH (ref 0.0–2.0)
Acid-base deficit: 5 mmol/L — ABNORMAL HIGH (ref 0.0–2.0)
Acid-base deficit: 6 mmol/L — ABNORMAL HIGH (ref 0.0–2.0)
Acid-base deficit: 6 mmol/L — ABNORMAL HIGH (ref 0.0–2.0)
Acid-base deficit: 4 mmol/L — ABNORMAL HIGH (ref 0.0–2.0)
Acid-base deficit: 7 mmol/L — ABNORMAL HIGH (ref 0.0–2.0)
Acid-base deficit: 4 mmol/L — ABNORMAL HIGH (ref 0.0–2.0)
Acid-base deficit: 5 mmol/L — ABNORMAL HIGH (ref 0.0–2.0)
Acid-base deficit: 4 mmol/L — ABNORMAL HIGH (ref 0.0–2.0)
Bicarbonate: 21.6 mmol/L (ref 20.0–28.0)
Bicarbonate: 23.7 mmol/L (ref 20.0–28.0)
Bicarbonate: 21.2 mmol/L (ref 20.0–28.0)
Bicarbonate: 21.7 mmol/L (ref 20.0–28.0)
Bicarbonate: 21.1 mmol/L (ref 20.0–28.0)
Bicarbonate: 21.5 mmol/L (ref 20.0–28.0)
Bicarbonate: 20.6 mmol/L (ref 20.0–28.0)
Bicarbonate: 21.6 mmol/L (ref 20.0–28.0)
Bicarbonate: 22.2 mmol/L (ref 20.0–28.0)
Bicarbonate: 21.9 mmol/L (ref 20.0–28.0)
Bicarbonate: 22.1 mmol/L (ref 20.0–28.0)
Calcium, Ion: 0.96 mmol/L — ABNORMAL LOW (ref 1.15–1.40)
Calcium, Ion: 1.04 mmol/L — ABNORMAL LOW (ref 1.15–1.40)
Calcium, Ion: 1.04 mmol/L — ABNORMAL LOW (ref 1.15–1.40)
Calcium, Ion: 1.04 mmol/L — ABNORMAL LOW (ref 1.15–1.40)
Calcium, Ion: 1.06 mmol/L — ABNORMAL LOW (ref 1.15–1.40)
Calcium, Ion: 1.02 mmol/L — ABNORMAL LOW (ref 1.15–1.40)
Calcium, Ion: 0.87 mmol/L — CL (ref 1.15–1.40)
Calcium, Ion: 0.9 mmol/L — ABNORMAL LOW (ref 1.15–1.40)
Calcium, Ion: 1.19 mmol/L (ref 1.15–1.40)
Calcium, Ion: 1.1 mmol/L — ABNORMAL LOW (ref 1.15–1.40)
Calcium, Ion: 1.15 mmol/L (ref 1.15–1.40)
HCT: 22 % — ABNORMAL LOW (ref 36.0–46.0)
HCT: 23 % — ABNORMAL LOW (ref 36.0–46.0)
HCT: 22 % — ABNORMAL LOW (ref 36.0–46.0)
HCT: 29 % — ABNORMAL LOW (ref 36.0–46.0)
HCT: 26 % — ABNORMAL LOW (ref 36.0–46.0)
HCT: 20 % — ABNORMAL LOW (ref 36.0–46.0)
HCT: 20 % — ABNORMAL LOW (ref 36.0–46.0)
HCT: 26 % — ABNORMAL LOW (ref 36.0–46.0)
HCT: 20 % — ABNORMAL LOW (ref 36.0–46.0)
HCT: 24 % — ABNORMAL LOW (ref 36.0–46.0)
HCT: 28 % — ABNORMAL LOW (ref 36.0–46.0)
Hemoglobin: 7.5 g/dL — ABNORMAL LOW (ref 12.0–15.0)
Hemoglobin: 7.8 g/dL — ABNORMAL LOW (ref 12.0–15.0)
Hemoglobin: 7.5 g/dL — ABNORMAL LOW (ref 12.0–15.0)
Hemoglobin: 9.9 g/dL — ABNORMAL LOW (ref 12.0–15.0)
Hemoglobin: 8.8 g/dL — ABNORMAL LOW (ref 12.0–15.0)
Hemoglobin: 6.8 g/dL — CL (ref 12.0–15.0)
Hemoglobin: 6.8 g/dL — CL (ref 12.0–15.0)
Hemoglobin: 8.8 g/dL — ABNORMAL LOW (ref 12.0–15.0)
Hemoglobin: 6.8 g/dL — CL (ref 12.0–15.0)
Hemoglobin: 8.2 g/dL — ABNORMAL LOW (ref 12.0–15.0)
Hemoglobin: 9.5 g/dL — ABNORMAL LOW (ref 12.0–15.0)
O2 Saturation: 100 %
O2 Saturation: 100 %
O2 Saturation: 99 %
O2 Saturation: 95 %
O2 Saturation: 97 %
O2 Saturation: 96 %
O2 Saturation: 100 %
O2 Saturation: 100 %
O2 Saturation: 98 %
O2 Saturation: 99 %
O2 Saturation: 99 %
Patient temperature: 35.3
Patient temperature: 35.2
Patient temperature: 36.5
Patient temperature: 35.3
Patient temperature: 35
Patient temperature: 36.4
Patient temperature: 37
Potassium: 3.9 mmol/L (ref 3.5–5.1)
Potassium: 4.7 mmol/L (ref 3.5–5.1)
Potassium: 4.7 mmol/L (ref 3.5–5.1)
Potassium: 4.5 mmol/L (ref 3.5–5.1)
Potassium: 4.4 mmol/L (ref 3.5–5.1)
Potassium: 4.3 mmol/L (ref 3.5–5.1)
Potassium: 4.5 mmol/L (ref 3.5–5.1)
Potassium: 4.7 mmol/L (ref 3.5–5.1)
Potassium: 4.3 mmol/L (ref 3.5–5.1)
Potassium: 4.9 mmol/L (ref 3.5–5.1)
Potassium: 5.3 mmol/L — ABNORMAL HIGH (ref 3.5–5.1)
Sodium: 139 mmol/L (ref 135–145)
Sodium: 139 mmol/L (ref 135–145)
Sodium: 137 mmol/L (ref 135–145)
Sodium: 139 mmol/L (ref 135–145)
Sodium: 138 mmol/L (ref 135–145)
Sodium: 139 mmol/L (ref 135–145)
Sodium: 140 mmol/L (ref 135–145)
Sodium: 141 mmol/L (ref 135–145)
Sodium: 137 mmol/L (ref 135–145)
Sodium: 139 mmol/L (ref 135–145)
Sodium: 138 mmol/L (ref 135–145)
TCO2: 22 mmol/L (ref 22–32)
TCO2: 25 mmol/L (ref 22–32)
TCO2: 22 mmol/L (ref 22–32)
TCO2: 23 mmol/L (ref 22–32)
TCO2: 23 mmol/L (ref 22–32)
TCO2: 23 mmol/L (ref 22–32)
TCO2: 22 mmol/L (ref 22–32)
TCO2: 23 mmol/L (ref 22–32)
TCO2: 24 mmol/L (ref 22–32)
TCO2: 23 mmol/L (ref 22–32)
TCO2: 23 mmol/L (ref 22–32)
pCO2 arterial: 30.8 mm[Hg] — ABNORMAL LOW (ref 32–48)
pCO2 arterial: 38.7 mm[Hg] (ref 32–48)
pCO2 arterial: 32.5 mm[Hg] (ref 32–48)
pCO2 arterial: 41.3 mm[Hg] (ref 32–48)
pCO2 arterial: 43.9 mm[Hg] (ref 32–48)
pCO2 arterial: 49.7 mm[Hg] — ABNORMAL HIGH (ref 32–48)
pCO2 arterial: 38.2 mm[Hg] (ref 32–48)
pCO2 arterial: 49.6 mm[Hg] — ABNORMAL HIGH (ref 32–48)
pCO2 arterial: 43.5 mm[Hg] (ref 32–48)
pCO2 arterial: 45.5 mm[Hg] (ref 32–48)
pCO2 arterial: 43.4 mm[Hg] (ref 32–48)
pH, Arterial: 7.453 — ABNORMAL HIGH (ref 7.35–7.45)
pH, Arterial: 7.395 (ref 7.35–7.45)
pH, Arterial: 7.423 (ref 7.35–7.45)
pH, Arterial: 7.32 — ABNORMAL LOW (ref 7.35–7.45)
pH, Arterial: 7.281 — ABNORMAL LOW (ref 7.35–7.45)
pH, Arterial: 7.24 — ABNORMAL LOW (ref 7.35–7.45)
pH, Arterial: 7.226 — ABNORMAL LOW (ref 7.35–7.45)
pH, Arterial: 7.352 (ref 7.35–7.45)
pH, Arterial: 7.304 — ABNORMAL LOW (ref 7.35–7.45)
pH, Arterial: 7.288 — ABNORMAL LOW (ref 7.35–7.45)
pH, Arterial: 7.314 — ABNORMAL LOW (ref 7.35–7.45)
pO2, Arterial: 297 mm[Hg] — ABNORMAL HIGH (ref 83–108)
pO2, Arterial: 347 mm[Hg] — ABNORMAL HIGH (ref 83–108)
pO2, Arterial: 126 mm[Hg] — ABNORMAL HIGH (ref 83–108)
pO2, Arterial: 77 mm[Hg] — ABNORMAL LOW (ref 83–108)
pO2, Arterial: 90 mm[Hg] (ref 83–108)
pO2, Arterial: 96 mm[Hg] (ref 83–108)
pO2, Arterial: 328 mm[Hg] — ABNORMAL HIGH (ref 83–108)
pO2, Arterial: 357 mm[Hg] — ABNORMAL HIGH (ref 83–108)
pO2, Arterial: 105 mm[Hg] (ref 83–108)
pO2, Arterial: 170 mm[Hg] — ABNORMAL HIGH (ref 83–108)
pO2, Arterial: 161 mm[Hg] — ABNORMAL HIGH (ref 83–108)

## 2023-11-29 LAB — CBC
HCT: 29.1 % — ABNORMAL LOW (ref 36.0–46.0)
HCT: 24.8 % — ABNORMAL LOW (ref 36.0–46.0)
HCT: 29.2 % — ABNORMAL LOW (ref 36.0–46.0)
Hemoglobin: 9.3 g/dL — ABNORMAL LOW (ref 12.0–15.0)
Hemoglobin: 8.3 g/dL — ABNORMAL LOW (ref 12.0–15.0)
Hemoglobin: 10.2 g/dL — ABNORMAL LOW (ref 12.0–15.0)
MCH: 29.2 pg (ref 26.0–34.0)
MCH: 30.1 pg (ref 26.0–34.0)
MCH: 30.4 pg (ref 26.0–34.0)
MCHC: 32 g/dL (ref 30.0–36.0)
MCHC: 33.5 g/dL (ref 30.0–36.0)
MCHC: 34.9 g/dL (ref 30.0–36.0)
MCV: 91.5 fL (ref 80.0–100.0)
MCV: 89.9 fL (ref 80.0–100.0)
MCV: 87.2 fL (ref 80.0–100.0)
Platelets: 106 10*3/uL — ABNORMAL LOW (ref 150–400)
Platelets: 84 10*3/uL — ABNORMAL LOW (ref 150–400)
Platelets: 110 10*3/uL — ABNORMAL LOW (ref 150–400)
RBC: 3.18 MIL/uL — ABNORMAL LOW (ref 3.87–5.11)
RBC: 2.76 MIL/uL — ABNORMAL LOW (ref 3.87–5.11)
RBC: 3.35 MIL/uL — ABNORMAL LOW (ref 3.87–5.11)
RDW: 13.7 % (ref 11.5–15.5)
RDW: 13.4 % (ref 11.5–15.5)
RDW: 13.5 % (ref 11.5–15.5)
WBC: 19.8 10*3/uL — ABNORMAL HIGH (ref 4.0–10.5)
WBC: 10.1 10*3/uL (ref 4.0–10.5)
WBC: 11.9 10*3/uL — ABNORMAL HIGH (ref 4.0–10.5)
nRBC: 0 % (ref 0.0–0.2)
nRBC: 0 % (ref 0.0–0.2)
nRBC: 0 % (ref 0.0–0.2)

## 2023-11-29 LAB — POCT I-STAT EG7
Acid-base deficit: 2 mmol/L (ref 0.0–2.0)
Bicarbonate: 22.6 mmol/L (ref 20.0–28.0)
Calcium, Ion: 1.02 mmol/L — ABNORMAL LOW (ref 1.15–1.40)
HCT: 22 % — ABNORMAL LOW (ref 36.0–46.0)
Hemoglobin: 7.5 g/dL — ABNORMAL LOW (ref 12.0–15.0)
O2 Saturation: 64 %
Potassium: 4 mmol/L (ref 3.5–5.1)
Sodium: 140 mmol/L (ref 135–145)
TCO2: 24 mmol/L (ref 22–32)
pCO2, Ven: 37.7 mm[Hg] — ABNORMAL LOW (ref 44–60)
pH, Ven: 7.387 (ref 7.25–7.43)
pO2, Ven: 33 mm[Hg] (ref 32–45)

## 2023-11-29 LAB — COOXEMETRY PANEL
Carboxyhemoglobin: 1.9 % — ABNORMAL HIGH (ref 0.5–1.5)
Carboxyhemoglobin: 1.8 % — ABNORMAL HIGH (ref 0.5–1.5)
Methemoglobin: <0.7 % (ref 0.0–1.5)
Methemoglobin: <0.7 % (ref 0.0–1.5)
O2 Saturation: 49.2 %
O2 Saturation: 77.8 %
Total hemoglobin: 9.1 g/dL — ABNORMAL LOW (ref 12.0–16.0)
Total hemoglobin: 9.7 g/dL — ABNORMAL LOW (ref 12.0–16.0)

## 2023-11-29 LAB — BASIC METABOLIC PANEL
Anion gap: 11 (ref 5–15)
BUN: 32 mg/dL — ABNORMAL HIGH (ref 8–23)
CO2: 18 mmol/L — ABNORMAL LOW (ref 22–32)
Calcium: 7.5 mg/dL — ABNORMAL LOW (ref 8.9–10.3)
Chloride: 107 mmol/L (ref 98–111)
Creatinine, Ser: 1.43 mg/dL — ABNORMAL HIGH (ref 0.44–1.00)
GFR, Estimated: 39 mL/min — ABNORMAL LOW (ref >60)
Glucose, Bld: 157 mg/dL — ABNORMAL HIGH (ref 70–99)
Potassium: 4.7 mmol/L (ref 3.5–5.1)
Sodium: 136 mmol/L (ref 135–145)

## 2023-11-29 LAB — GLUCOSE, CAPILLARY
Glucose-Capillary: 174 mg/dL — ABNORMAL HIGH (ref 70–99)
Glucose-Capillary: 182 mg/dL — ABNORMAL HIGH (ref 70–99)
Glucose-Capillary: 171 mg/dL — ABNORMAL HIGH (ref 70–99)
Glucose-Capillary: 102 mg/dL — ABNORMAL HIGH (ref 70–99)
Glucose-Capillary: 153 mg/dL — ABNORMAL HIGH (ref 70–99)
Glucose-Capillary: 174 mg/dL — ABNORMAL HIGH (ref 70–99)
Glucose-Capillary: 151 mg/dL — ABNORMAL HIGH (ref 70–99)

## 2023-11-29 LAB — PLATELET COUNT: Platelets: 91 10*3/uL — ABNORMAL LOW (ref 150–400)

## 2023-11-29 LAB — PROTIME-INR
INR: 1.7 — ABNORMAL HIGH (ref 0.8–1.2)
INR: 1.6 — ABNORMAL HIGH (ref 0.8–1.2)
Prothrombin Time: 20.2 s — ABNORMAL HIGH (ref 11.4–15.2)
Prothrombin Time: 19.4 s — ABNORMAL HIGH (ref 11.4–15.2)

## 2023-11-29 LAB — PREPARE RBC (CROSSMATCH)

## 2023-11-29 LAB — MAGNESIUM: Magnesium: 3.2 mg/dL — ABNORMAL HIGH (ref 1.7–2.4)

## 2023-11-29 LAB — ECHO INTRAOPERATIVE TEE
Height: 63 in
Weight: 2928 [oz_av]

## 2023-11-29 LAB — APTT
aPTT: 41 s — ABNORMAL HIGH (ref 24–36)
aPTT: 46 s — ABNORMAL HIGH (ref 24–36)

## 2023-11-29 LAB — HEMOGLOBIN AND HEMATOCRIT, BLOOD
HCT: 22.3 % — ABNORMAL LOW (ref 36.0–46.0)
Hemoglobin: 7.2 g/dL — ABNORMAL LOW (ref 12.0–15.0)

## 2023-11-29 LAB — SARS CORONAVIRUS 2 BY RT PCR: SARS Coronavirus 2 by RT PCR: NEGATIVE

## 2023-11-29 LAB — CG4 I-STAT (LACTIC ACID): Lactic Acid, Venous: 1.4 mmol/L (ref 0.5–1.9)

## 2023-11-29 LAB — FIBRINOGEN: Fibrinogen: 215 mg/dL (ref 210–475)

## 2023-11-29 SURGERY — REPLACEMENT, MITRAL VALVE
Anesthesia: General | Site: Chest

## 2023-11-29 SURGERY — EXPLORATION POST OPERATIVE OPEN HEART
Anesthesia: General

## 2023-11-29 MED ORDER — VANCOMYCIN HCL 1000 MG IV SOLR
INTRAVENOUS | Status: DC | PRN
  Administered 2023-11-29: 1000 mL

## 2023-11-29 MED ORDER — ONDANSETRON HCL 4 MG/2ML IJ SOLN
INTRAMUSCULAR | Status: AC
  Filled 2023-11-29: qty 2

## 2023-11-29 MED ORDER — LIDOCAINE 2% (20 MG/ML) 5 ML SYRINGE
INTRAMUSCULAR | Status: AC
Start: 2023-11-29 — End: ?
  Filled 2023-11-29: qty 5

## 2023-11-29 MED ORDER — PROPOFOL 10 MG/ML IV BOLUS
INTRAVENOUS | Status: AC
  Filled 2023-11-29: qty 20

## 2023-11-29 MED ORDER — POTASSIUM CHLORIDE 2 MEQ/ML IV SOLN
80.0000 meq | INTRAVENOUS | Status: DC
Start: 2023-11-29 — End: 2023-11-29
  Filled 2023-11-29: qty 40

## 2023-11-29 MED ORDER — DOCUSATE SODIUM 100 MG PO CAPS
200.0000 mg | ORAL_CAPSULE | Freq: Every day | ORAL | Status: DC
  Administered 2023-11-30 – 2023-12-02 (×3): 200 mg via ORAL
  Filled 2023-11-29 (×3): qty 2

## 2023-11-29 MED ORDER — FENTANYL CITRATE (PF) 250 MCG/5ML IJ SOLN
INTRAMUSCULAR | Status: AC
  Filled 2023-11-29: qty 5

## 2023-11-29 MED ORDER — ACETAMINOPHEN 160 MG/5ML PO SOLN: 650.0000 mg | Freq: Once | ORAL | Status: DC

## 2023-11-29 MED ORDER — ~~LOC~~ CARDIAC SURGERY, PATIENT & FAMILY EDUCATION
Freq: Once | Status: DC
  Filled 2023-11-29: qty 1

## 2023-11-29 MED ORDER — VASOPRESSIN 20 UNITS/100 ML INFUSION FOR SHOCK
0.0000 [IU]/min | INTRAVENOUS | Status: DC
  Administered 2023-11-29 – 2023-12-01 (×7): 0.04 [IU]/min via INTRAVENOUS
  Administered 2023-12-02: 0.06 [IU]/min via INTRAVENOUS
  Administered 2023-12-02 – 2023-12-12 (×28): 0.04 [IU]/min via INTRAVENOUS
  Administered 2023-12-12: 0.02 [IU]/min via INTRAVENOUS
  Filled 2023-11-29: qty 200
  Filled 2023-11-29 (×38): qty 100

## 2023-11-29 MED ORDER — BISACODYL 10 MG RE SUPP: 10.0000 mg | Freq: Every day | RECTAL | Status: DC

## 2023-11-29 MED ORDER — NOREPINEPHRINE 4 MG/250ML-% IV SOLN
0.0000 ug/min | INTRAVENOUS | Status: DC
  Filled 2023-11-29: qty 250

## 2023-11-29 MED ORDER — PLASMA-LYTE A IV SOLN
INTRAVENOUS | Status: DC
  Filled 2023-11-29: qty 5

## 2023-11-29 MED ORDER — SODIUM CHLORIDE 0.9 % IV SOLN: INTRAVENOUS | Status: DC | PRN

## 2023-11-29 MED ORDER — AMIODARONE HCL IN DEXTROSE 360-4.14 MG/200ML-% IV SOLN
INTRAVENOUS | Status: AC
  Administered 2023-11-29: 60 mg/h via INTRAVENOUS
  Filled 2023-11-29: qty 200

## 2023-11-29 MED ORDER — PROPOFOL 1000 MG/100ML IV EMUL
5.0000 ug/kg/min | INTRAVENOUS | Status: DC
  Administered 2023-11-29: 10 ug/kg/min via INTRAVENOUS
  Filled 2023-11-29 (×2): qty 100

## 2023-11-29 MED ORDER — IPRATROPIUM BROMIDE 0.02 % IN SOLN
0.5000 mg | Freq: Four times a day (QID) | RESPIRATORY_TRACT | Status: DC
  Administered 2023-11-29 – 2023-11-30 (×3): 0.5 mg via RESPIRATORY_TRACT
  Filled 2023-11-29 (×3): qty 2.5

## 2023-11-29 MED ORDER — EPHEDRINE SULFATE-NACL 50-0.9 MG/10ML-% IV SOSY
PREFILLED_SYRINGE | INTRAVENOUS | Status: DC | PRN
  Administered 2023-11-29 (×4): 5 mg via INTRAVENOUS
  Administered 2023-11-29: 2.5 mg via INTRAVENOUS
  Administered 2023-11-29 (×2): 5 mg via INTRAVENOUS
  Administered 2023-11-29: 2.5 mg via INTRAVENOUS

## 2023-11-29 MED ORDER — DEXMEDETOMIDINE HCL IN NACL 400 MCG/100ML IV SOLN
0.0000 ug/kg/h | INTRAVENOUS | Status: DC
  Administered 2023-11-29 – 2023-11-30 (×2): 0.7 ug/kg/h via INTRAVENOUS
  Filled 2023-11-29 (×2): qty 100

## 2023-11-29 MED ORDER — ORAL CARE MOUTH RINSE: 15.0000 mL | Freq: Once | OROMUCOSAL | Status: AC

## 2023-11-29 MED ORDER — SODIUM CHLORIDE 0.9% FLUSH: 3.0000 mL | INTRAVENOUS | Status: DC | PRN

## 2023-11-29 MED ORDER — SODIUM CHLORIDE 0.9% IV SOLUTION: Freq: Once | INTRAVENOUS | Status: AC

## 2023-11-29 MED ORDER — HEPARIN SODIUM (PORCINE) 1000 UNIT/ML IJ SOLN
INTRAMUSCULAR | Status: AC
  Filled 2023-11-29: qty 1

## 2023-11-29 MED ORDER — CEFAZOLIN SODIUM-DEXTROSE 2-4 GM/100ML-% IV SOLN
2.0000 g | INTRAVENOUS | Status: DC
  Filled 2023-11-29: qty 100

## 2023-11-29 MED ORDER — LACTATED RINGERS IV SOLN: INTRAVENOUS | Status: DC | PRN

## 2023-11-29 MED ORDER — CEFAZOLIN SODIUM-DEXTROSE 2-4 GM/100ML-% IV SOLN
2.0000 g | Freq: Three times a day (TID) | INTRAVENOUS | Status: AC
  Administered 2023-11-29 – 2023-12-01 (×6): 2 g via INTRAVENOUS
  Filled 2023-11-29 (×6): qty 100

## 2023-11-29 MED ORDER — METOPROLOL TARTRATE 5 MG/5ML IV SOLN: 2.5000 mg | INTRAVENOUS | Status: DC | PRN

## 2023-11-29 MED ORDER — TRAMADOL HCL 50 MG PO TABS: 50.0000 mg | ORAL_TABLET | ORAL | Status: DC | PRN

## 2023-11-29 MED ORDER — TRANEXAMIC ACID (OHS) PUMP PRIME SOLUTION
2.0000 mg/kg | INTRAVENOUS | Status: DC
  Filled 2023-11-29: qty 1.66

## 2023-11-29 MED ORDER — OXYCODONE HCL 5 MG PO TABS
5.0000 mg | ORAL_TABLET | ORAL | Status: DC | PRN
  Administered 2023-12-10 – 2023-12-22 (×15): 5 mg via ORAL
  Filled 2023-11-29 (×15): qty 1

## 2023-11-29 MED ORDER — PLASMA-LYTE A IV SOLN
INTRAVENOUS | Status: DC
  Filled 2023-11-29: qty 2.5

## 2023-11-29 MED ORDER — CLEVIDIPINE BUTYRATE 0.5 MG/ML IV EMUL
INTRAVENOUS | Status: AC
  Filled 2023-11-29: qty 50

## 2023-11-29 MED ORDER — PHENYLEPHRINE HCL-NACL 20-0.9 MG/250ML-% IV SOLN: 0.0000 ug/min | INTRAVENOUS | Status: DC

## 2023-11-29 MED ORDER — VASOPRESSIN 20 UNIT/ML IV SOLN
INTRAVENOUS | Status: DC | PRN
  Administered 2023-11-29 (×4): 1 [IU] via INTRAVENOUS

## 2023-11-29 MED ORDER — SODIUM CHLORIDE 0.9% IV SOLUTION: Freq: Once | INTRAVENOUS | Status: DC

## 2023-11-29 MED ORDER — METOPROLOL TARTRATE 12.5 MG HALF TABLET
12.5000 mg | ORAL_TABLET | Freq: Once | ORAL | Status: DC
  Filled 2023-11-29: qty 1

## 2023-11-29 MED ORDER — AMIODARONE LOAD VIA INFUSION
150.0000 mg | Freq: Once | INTRAVENOUS | Status: AC
Start: 2023-11-29 — End: 2023-11-29
  Administered 2023-11-29: 150 mg via INTRAVENOUS
  Filled 2023-11-29: qty 83.34

## 2023-11-29 MED ORDER — ASPIRIN 81 MG PO CHEW: 324.0000 mg | CHEWABLE_TABLET | Freq: Once | ORAL | Status: DC

## 2023-11-29 MED ORDER — SODIUM CHLORIDE 0.45 % IV SOLN: INTRAVENOUS | Status: AC | PRN

## 2023-11-29 MED ORDER — CHLORHEXIDINE GLUCONATE CLOTH 2 % EX PADS
6.0000 | MEDICATED_PAD | Freq: Every day | CUTANEOUS | Status: DC
Start: 2023-11-29 — End: 2023-11-30
  Administered 2023-11-29: 6 via TOPICAL

## 2023-11-29 MED ORDER — MIDAZOLAM HCL (PF) 5 MG/ML IJ SOLN
INTRAMUSCULAR | Status: DC | PRN
  Administered 2023-11-29: 2 mg via INTRAVENOUS

## 2023-11-29 MED ORDER — AMIODARONE HCL 200 MG PO TABS: 400.0000 mg | ORAL_TABLET | Freq: Two times a day (BID) | ORAL | Status: DC

## 2023-11-29 MED ORDER — CHLORHEXIDINE GLUCONATE 0.12 % MT SOLN
15.0000 mL | Freq: Once | OROMUCOSAL | Status: AC
  Administered 2023-11-29: 15 mL via OROMUCOSAL
  Filled 2023-11-29: qty 15

## 2023-11-29 MED ORDER — NITROGLYCERIN IN D5W 200-5 MCG/ML-% IV SOLN: 0.0000 ug/min | INTRAVENOUS | Status: DC

## 2023-11-29 MED ORDER — MIDAZOLAM HCL 2 MG/2ML IJ SOLN: 2.0000 mg | INTRAMUSCULAR | Status: DC | PRN

## 2023-11-29 MED ORDER — LIDOCAINE HCL (CARDIAC) PF 100 MG/5ML IV SOSY
PREFILLED_SYRINGE | INTRAVENOUS | Status: DC | PRN
  Administered 2023-11-29: 100 mg via INTRAVENOUS

## 2023-11-29 MED ORDER — VANCOMYCIN HCL IN DEXTROSE 1-5 GM/200ML-% IV SOLN
1000.0000 mg | Freq: Once | INTRAVENOUS | Status: AC
  Administered 2023-11-29: 1000 mg via INTRAVENOUS
  Filled 2023-11-29: qty 200

## 2023-11-29 MED ORDER — METOCLOPRAMIDE HCL 5 MG/ML IJ SOLN
10.0000 mg | Freq: Four times a day (QID) | INTRAMUSCULAR | Status: DC
  Administered 2023-11-29 (×2): 10 mg via INTRAVENOUS
  Filled 2023-11-29 (×2): qty 2

## 2023-11-29 MED ORDER — CALCIUM GLUCONATE-NACL 1-0.675 GM/50ML-% IV SOLN
1.0000 g | Freq: Once | INTRAVENOUS | Status: AC
  Administered 2023-11-29: 1000 mg via INTRAVENOUS
  Filled 2023-11-29: qty 50

## 2023-11-29 MED ORDER — VANCOMYCIN HCL 1000 MG IV SOLR
INTRAVENOUS | Status: DC
  Filled 2023-11-29: qty 20

## 2023-11-29 MED ORDER — ONDANSETRON HCL 4 MG/2ML IJ SOLN
INTRAMUSCULAR | Status: DC | PRN
  Administered 2023-11-29: 4 mg via INTRAVENOUS

## 2023-11-29 MED ORDER — ACETAMINOPHEN 160 MG/5ML PO SOLN
1000.0000 mg | Freq: Four times a day (QID) | ORAL | Status: AC
  Administered 2023-11-30 – 2023-12-04 (×10): 1000 mg
  Filled 2023-11-29 (×10): qty 40.6

## 2023-11-29 MED ORDER — ROCURONIUM BROMIDE 10 MG/ML (PF) SYRINGE
PREFILLED_SYRINGE | INTRAVENOUS | Status: DC | PRN
  Administered 2023-11-29: 100 mg via INTRAVENOUS

## 2023-11-29 MED ORDER — AMIODARONE HCL IN DEXTROSE 360-4.14 MG/200ML-% IV SOLN
30.0000 mg/h | INTRAVENOUS | Status: DC
  Administered 2023-11-30 – 2023-12-02 (×6): 30 mg/h via INTRAVENOUS
  Administered 2023-12-03 (×3): 60 mg/h via INTRAVENOUS
  Administered 2023-12-03 (×2): 30 mg/h via INTRAVENOUS
  Administered 2023-12-04: 60 mg/h via INTRAVENOUS
  Administered 2023-12-04 – 2023-12-08 (×9): 30 mg/h via INTRAVENOUS
  Administered 2023-12-09 (×2): 60 mg/h via INTRAVENOUS
  Administered 2023-12-09: 30 mg/h via INTRAVENOUS
  Administered 2023-12-10 (×2): 60 mg/h via INTRAVENOUS
  Filled 2023-11-29 (×31): qty 200

## 2023-11-29 MED ORDER — INSULIN REGULAR(HUMAN) IN NACL 100-0.9 UT/100ML-% IV SOLN: INTRAVENOUS | Status: DC

## 2023-11-29 MED ORDER — MIDAZOLAM HCL 2 MG/2ML IJ SOLN
INTRAMUSCULAR | Status: AC
Start: 2023-11-29 — End: ?
  Filled 2023-11-29: qty 2

## 2023-11-29 MED ORDER — DEXMEDETOMIDINE HCL IN NACL 400 MCG/100ML IV SOLN
0.1000 ug/kg/h | INTRAVENOUS | Status: DC
  Filled 2023-11-29: qty 100

## 2023-11-29 MED ORDER — ONDANSETRON HCL 4 MG/2ML IJ SOLN
4.0000 mg | Freq: Four times a day (QID) | INTRAMUSCULAR | Status: DC | PRN
  Administered 2023-12-01 – 2024-01-06 (×10): 4 mg via INTRAVENOUS
  Filled 2023-11-29 (×10): qty 2

## 2023-11-29 MED ORDER — STERILE WATER FOR IRRIGATION IR SOLN
Status: DC | PRN
  Administered 2023-11-29: 2000 mL

## 2023-11-29 MED ORDER — CHLORHEXIDINE GLUCONATE CLOTH 2 % EX PADS
6.0000 | MEDICATED_PAD | Freq: Every day | CUTANEOUS | Status: DC
  Administered 2023-11-29 – 2024-01-04 (×39): 6 via TOPICAL

## 2023-11-29 MED ORDER — 0.9 % SODIUM CHLORIDE (POUR BTL) OPTIME
TOPICAL | Status: DC | PRN
  Administered 2023-11-29: 4000 mL

## 2023-11-29 MED ORDER — ROCURONIUM BROMIDE 10 MG/ML (PF) SYRINGE
PREFILLED_SYRINGE | INTRAVENOUS | Status: DC | PRN
  Administered 2023-11-29: 50 mg via INTRAVENOUS
  Administered 2023-11-29: 100 mg via INTRAVENOUS
  Administered 2023-11-29 (×2): 50 mg via INTRAVENOUS

## 2023-11-29 MED ORDER — CEFAZOLIN SODIUM-DEXTROSE 2-4 GM/100ML-% IV SOLN
2.0000 g | INTRAVENOUS | Status: DC
Start: 2023-11-29 — End: 2023-11-29
  Filled 2023-11-29: qty 100

## 2023-11-29 MED ORDER — ARTIFICIAL TEARS OPHTHALMIC OINT
TOPICAL_OINTMENT | OPHTHALMIC | Status: DC | PRN
  Filled 2023-11-29: qty 3.5

## 2023-11-29 MED ORDER — LIDOCAINE HCL (PF) 2 % IJ SOLN
INTRAMUSCULAR | Status: DC
  Filled 2023-11-29: qty 13

## 2023-11-29 MED ORDER — ASPIRIN 325 MG PO TBEC
325.0000 mg | DELAYED_RELEASE_TABLET | Freq: Every day | ORAL | Status: DC
  Administered 2023-11-30 – 2023-12-02 (×3): 325 mg via ORAL
  Filled 2023-11-29 (×4): qty 1

## 2023-11-29 MED ORDER — ROCURONIUM BROMIDE 10 MG/ML (PF) SYRINGE
PREFILLED_SYRINGE | INTRAVENOUS | Status: AC
Start: 2023-11-29 — End: ?
  Filled 2023-11-29: qty 30

## 2023-11-29 MED ORDER — TRANEXAMIC ACID (OHS) BOLUS VIA INFUSION
15.0000 mg/kg | INTRAVENOUS | Status: DC
  Filled 2023-11-29: qty 1245

## 2023-11-29 MED ORDER — BISACODYL 5 MG PO TBEC
10.0000 mg | DELAYED_RELEASE_TABLET | Freq: Every day | ORAL | Status: DC
  Administered 2023-11-30 – 2023-12-05 (×5): 10 mg via ORAL
  Filled 2023-11-29 (×5): qty 2

## 2023-11-29 MED ORDER — SODIUM CHLORIDE 0.9% FLUSH
3.0000 mL | Freq: Two times a day (BID) | INTRAVENOUS | Status: DC
  Administered 2023-11-30: 3 mL via INTRAVENOUS
  Administered 2023-12-01: 20 mL via INTRAVENOUS
  Administered 2023-12-01 – 2023-12-13 (×13): 3 mL via INTRAVENOUS

## 2023-11-29 MED ORDER — NITROGLYCERIN IN D5W 200-5 MCG/ML-% IV SOLN: 2.0000 ug/min | INTRAVENOUS | Status: DC

## 2023-11-29 MED ORDER — 0.9 % SODIUM CHLORIDE (POUR BTL) OPTIME
TOPICAL | Status: DC | PRN
  Administered 2023-11-29: 5000 mL

## 2023-11-29 MED ORDER — MILRINONE LACTATE IN DEXTROSE 20-5 MG/100ML-% IV SOLN
0.3000 ug/kg/min | INTRAVENOUS | Status: DC
  Filled 2023-11-29: qty 100

## 2023-11-29 MED ORDER — METHADONE HCL IV SYRINGE 10 MG/ML FOR CABG
0.3000 mg/kg | Freq: Once | INTRAMUSCULAR | Status: AC
Start: 2023-11-29 — End: 2023-11-29
  Administered 2023-11-29: 16 mg via INTRAVENOUS
  Filled 2023-11-29: qty 1.6

## 2023-11-29 MED ORDER — ASPIRIN 81 MG PO CHEW
324.0000 mg | CHEWABLE_TABLET | Freq: Every day | ORAL | Status: DC
  Administered 2023-12-03 – 2023-12-05 (×3): 324 mg
  Filled 2023-11-29 (×3): qty 4

## 2023-11-29 MED ORDER — CHLORHEXIDINE GLUCONATE 0.12 % MT SOLN
15.0000 mL | OROMUCOSAL | Status: AC
  Administered 2023-11-29: 15 mL via OROMUCOSAL
  Filled 2023-11-29: qty 15

## 2023-11-29 MED ORDER — PANTOPRAZOLE SODIUM 40 MG PO TBEC
40.0000 mg | DELAYED_RELEASE_TABLET | Freq: Every day | ORAL | Status: DC
  Administered 2023-12-01 – 2023-12-02 (×2): 40 mg via ORAL
  Filled 2023-11-29 (×2): qty 1

## 2023-11-29 MED ORDER — ORAL CARE MOUTH RINSE
15.0000 mL | OROMUCOSAL | Status: DC | PRN
Start: 2023-11-29 — End: 2023-11-30

## 2023-11-29 MED ORDER — HEPARIN 30,000 UNITS/1000 ML (OHS) CELLSAVER SOLUTION
Status: DC
  Filled 2023-11-29: qty 1000

## 2023-11-29 MED ORDER — INSULIN REGULAR(HUMAN) IN NACL 100-0.9 UT/100ML-% IV SOLN
INTRAVENOUS | Status: DC
  Filled 2023-11-29: qty 100

## 2023-11-29 MED ORDER — ACETAMINOPHEN 500 MG PO TABS
1000.0000 mg | ORAL_TABLET | Freq: Four times a day (QID) | ORAL | Status: AC
  Administered 2023-11-30 – 2023-12-02 (×9): 1000 mg via ORAL
  Filled 2023-11-29 (×10): qty 2

## 2023-11-29 MED ORDER — METOPROLOL TARTRATE 25 MG/10 ML ORAL SUSPENSION: 12.5000 mg | Freq: Two times a day (BID) | ORAL | Status: DC

## 2023-11-29 MED ORDER — VANCOMYCIN HCL 1250 MG/250ML IV SOLN
1250.0000 mg | INTRAVENOUS | Status: DC
  Filled 2023-11-29: qty 250

## 2023-11-29 MED ORDER — NOREPINEPHRINE 4 MG/250ML-% IV SOLN
0.0000 ug/min | INTRAVENOUS | Status: DC
  Administered 2023-11-30: 4 ug/min via INTRAVENOUS
  Administered 2023-12-01: 10 ug/min via INTRAVENOUS
  Administered 2023-12-01: 11 ug/min via INTRAVENOUS
  Administered 2023-12-01: 13 ug/min via INTRAVENOUS
  Administered 2023-12-01: 10 ug/min via INTRAVENOUS
  Administered 2023-12-02: 7 ug/min via INTRAVENOUS
  Administered 2023-12-02: 8 ug/min via INTRAVENOUS
  Administered 2023-12-02: 38 ug/min via INTRAVENOUS
  Filled 2023-11-29 (×5): qty 250
  Filled 2023-11-29: qty 500
  Filled 2023-11-29 (×2): qty 250

## 2023-11-29 MED ORDER — VASOPRESSIN 20 UNITS/100 ML INFUSION FOR SHOCK
INTRAVENOUS | Status: DC | PRN
  Administered 2023-11-29: .03 [IU]/min via INTRAVENOUS

## 2023-11-29 MED ORDER — EPHEDRINE 5 MG/ML INJ
INTRAVENOUS | Status: AC
  Filled 2023-11-29: qty 15

## 2023-11-29 MED ORDER — MORPHINE SULFATE (PF) 2 MG/ML IV SOLN
1.0000 mg | INTRAVENOUS | Status: DC | PRN
Start: 2023-11-29 — End: 2023-12-01

## 2023-11-29 MED ORDER — ALBUMIN HUMAN 5 % IV SOLN: INTRAVENOUS | Status: DC | PRN

## 2023-11-29 MED ORDER — ORAL CARE MOUTH RINSE
15.0000 mL | OROMUCOSAL | Status: DC
Start: 2023-11-29 — End: 2023-11-30
  Administered 2023-11-29 – 2023-11-30 (×6): 15 mL via OROMUCOSAL

## 2023-11-29 MED ORDER — MAGNESIUM SULFATE 4 GM/100ML IV SOLN
4.0000 g | Freq: Once | INTRAVENOUS | Status: AC
  Administered 2023-11-29: 4 g via INTRAVENOUS
  Filled 2023-11-29: qty 100

## 2023-11-29 MED ORDER — EPINEPHRINE HCL 5 MG/250ML IV SOLN IN NS
0.5000 ug/min | INTRAVENOUS | Status: DC
Start: 2023-11-29 — End: 2023-12-10
  Administered 2023-11-30: 6 ug/min via INTRAVENOUS
  Administered 2023-11-30: 3 ug/min via INTRAVENOUS
  Administered 2023-12-01 (×2): 7 ug/min via INTRAVENOUS
  Administered 2023-12-02: 10 ug/min via INTRAVENOUS
  Administered 2023-12-02: 7 ug/min via INTRAVENOUS
  Administered 2023-12-03: 20 ug/min via INTRAVENOUS
  Administered 2023-12-03: 9 ug/min via INTRAVENOUS
  Administered 2023-12-03: 6 ug/min via INTRAVENOUS
  Administered 2023-12-04: 9 ug/min via INTRAVENOUS
  Administered 2023-12-04: 8 ug/min via INTRAVENOUS
  Administered 2023-12-05: 7 ug/min via INTRAVENOUS
  Administered 2023-12-05 – 2023-12-07 (×4): 6 ug/min via INTRAVENOUS
  Administered 2023-12-08: 5 ug/min via INTRAVENOUS
  Filled 2023-11-29 (×18): qty 250

## 2023-11-29 MED ORDER — ACETYLCYSTEINE 20 % IN SOLN
4.0000 mL | Freq: Four times a day (QID) | RESPIRATORY_TRACT | Status: DC
  Administered 2023-11-29 – 2023-11-30 (×2): 4 mL via RESPIRATORY_TRACT
  Filled 2023-11-29 (×3): qty 4

## 2023-11-29 MED ORDER — FENTANYL CITRATE (PF) 250 MCG/5ML IJ SOLN
INTRAMUSCULAR | Status: DC | PRN
  Administered 2023-11-29 (×2): 100 ug via INTRAVENOUS
  Administered 2023-11-29: 50 ug via INTRAVENOUS

## 2023-11-29 MED ORDER — ALBUMIN HUMAN 5 % IV SOLN
250.0000 mL | INTRAVENOUS | Status: AC | PRN
  Administered 2023-11-29 (×5): 12.5 g via INTRAVENOUS
  Filled 2023-11-29 (×2): qty 250

## 2023-11-29 MED ORDER — LACTATED RINGERS IV SOLN: INTRAVENOUS | Status: AC

## 2023-11-29 MED ORDER — PROPOFOL 10 MG/ML IV BOLUS
INTRAVENOUS | Status: DC | PRN
  Administered 2023-11-29: 50 mg via INTRAVENOUS
  Administered 2023-11-29: 40 ug/kg/min via INTRAVENOUS

## 2023-11-29 MED ORDER — CHLORHEXIDINE GLUCONATE 4 % EX SOLN: 30.0000 mL | CUTANEOUS | Status: DC

## 2023-11-29 MED ORDER — SODIUM CHLORIDE 0.9 % IV SOLN: INTRAVENOUS | Status: AC

## 2023-11-29 MED ORDER — MIDAZOLAM HCL (PF) 5 MG/ML IJ SOLN
INTRAMUSCULAR | Status: DC | PRN
  Administered 2023-11-29 (×2): 1 mg via INTRAVENOUS

## 2023-11-29 MED ORDER — PROTAMINE SULFATE 10 MG/ML IV SOLN
INTRAVENOUS | Status: DC | PRN
  Administered 2023-11-29: 20 mg via INTRAVENOUS
  Administered 2023-11-29: 220 mg via INTRAVENOUS

## 2023-11-29 MED ORDER — PHENYLEPHRINE HCL-NACL 20-0.9 MG/250ML-% IV SOLN
30.0000 ug/min | INTRAVENOUS | Status: DC
  Filled 2023-11-29: qty 250

## 2023-11-29 MED ORDER — MIDAZOLAM HCL (PF) 10 MG/2ML IJ SOLN
INTRAMUSCULAR | Status: AC
  Filled 2023-11-29: qty 2

## 2023-11-29 MED ORDER — MILRINONE LACTATE IN DEXTROSE 20-5 MG/100ML-% IV SOLN: 0.5000 ug/kg/min | INTRAVENOUS | Status: DC

## 2023-11-29 MED ORDER — SUGAMMADEX SODIUM 200 MG/2ML IV SOLN
INTRAVENOUS | Status: DC | PRN
  Administered 2023-11-29: 200 mg via INTRAVENOUS

## 2023-11-29 MED ORDER — CALCIUM CHLORIDE 10 % IV SOLN
INTRAVENOUS | Status: DC | PRN
  Administered 2023-11-29 (×5): 200 mg via INTRAVENOUS

## 2023-11-29 MED ORDER — ACETYLCYSTEINE 10% NICU INHALATION SOLUTION
2.0000 mL | Freq: Four times a day (QID) | RESPIRATORY_TRACT | Status: DC
  Filled 2023-11-29 (×2): qty 2

## 2023-11-29 MED ORDER — HEMOSTATIC AGENTS (NO CHARGE) OPTIME
TOPICAL | Status: DC | PRN
  Administered 2023-11-29: 1
  Administered 2023-11-29: 3

## 2023-11-29 MED ORDER — EPINEPHRINE HCL 5 MG/250ML IV SOLN IN NS
0.0000 ug/min | INTRAVENOUS | Status: DC
  Filled 2023-11-29: qty 250

## 2023-11-29 MED ORDER — HEPARIN SODIUM (PORCINE) 1000 UNIT/ML IJ SOLN
INTRAMUSCULAR | Status: DC | PRN
  Administered 2023-11-29: 26000 [IU] via INTRAVENOUS

## 2023-11-29 MED ORDER — VASOPRESSIN 20 UNIT/ML IV SOLN
INTRAVENOUS | Status: AC
  Filled 2023-11-29: qty 1

## 2023-11-29 MED ORDER — ETOMIDATE 2 MG/ML IV SOLN
INTRAVENOUS | Status: AC
  Filled 2023-11-29: qty 10

## 2023-11-29 MED ORDER — AMIODARONE HCL IN DEXTROSE 360-4.14 MG/200ML-% IV SOLN
60.0000 mg/h | INTRAVENOUS | Status: DC
Start: 2023-11-29 — End: 2023-11-30
  Administered 2023-11-30: 60 mg/h via INTRAVENOUS
  Filled 2023-11-29 (×2): qty 200

## 2023-11-29 MED ORDER — FENTANYL 2500MCG IN NS 250ML (10MCG/ML) PREMIX INFUSION
0.0000 ug/h | INTRAVENOUS | Status: DC
  Administered 2023-11-30: 25 ug/h via INTRAVENOUS
  Filled 2023-11-29: qty 250

## 2023-11-29 MED ORDER — POTASSIUM CHLORIDE 10 MEQ/50ML IV SOLN: 10.0000 meq | INTRAVENOUS | Status: AC

## 2023-11-29 MED ORDER — TRANEXAMIC ACID 1000 MG/10ML IV SOLN
1.5000 mg/kg/h | INTRAVENOUS | Status: DC
  Filled 2023-11-29: qty 25

## 2023-11-29 MED ORDER — PLASMA-LYTE A IV SOLN
INTRAVENOUS | Status: DC | PRN
  Administered 2023-11-29: 1000 mL

## 2023-11-29 MED ORDER — SODIUM CHLORIDE 0.9 % IV SOLN: 10.0000 mL/h | Freq: Once | INTRAVENOUS | Status: DC

## 2023-11-29 MED ORDER — METOPROLOL TARTRATE 12.5 MG HALF TABLET: 12.5000 mg | ORAL_TABLET | Freq: Two times a day (BID) | ORAL | Status: DC

## 2023-11-29 MED ORDER — PANTOPRAZOLE SODIUM 40 MG IV SOLR
40.0000 mg | Freq: Every day | INTRAVENOUS | Status: AC
  Administered 2023-11-29 – 2023-11-30 (×2): 40 mg via INTRAVENOUS
  Filled 2023-11-29 (×2): qty 10

## 2023-11-29 MED ORDER — DEXTROSE 50 % IV SOLN: 0.0000 mL | INTRAVENOUS | Status: DC | PRN

## 2023-11-29 MED ORDER — CHLORHEXIDINE GLUCONATE 0.12 % MT SOLN: 15.0000 mL | Freq: Once | OROMUCOSAL | Status: DC

## 2023-11-29 MED ORDER — MANNITOL 20 % IV SOLN
INTRAVENOUS | Status: DC
  Filled 2023-11-29: qty 13

## 2023-11-29 MED ORDER — PHENYLEPHRINE 80 MCG/ML (10ML) SYRINGE FOR IV PUSH (FOR BLOOD PRESSURE SUPPORT)
PREFILLED_SYRINGE | INTRAVENOUS | Status: AC
Start: 2023-11-29 — End: ?
  Filled 2023-11-29: qty 10

## 2023-11-29 MED ORDER — SODIUM CHLORIDE 0.9 % IV SOLN: 250.0000 mL | INTRAVENOUS | Status: DC

## 2023-11-29 MED FILL — Heparin Sodium (Porcine) Inj 1000 Unit/ML: Qty: 1000 | Status: AC

## 2023-11-29 MED FILL — Electrolyte-R (PH 7.4) Solution: INTRAVENOUS | Qty: 3000 | Status: AC

## 2023-11-29 MED FILL — Sodium Chloride IV Soln 0.9%: INTRAVENOUS | Qty: 2000 | Status: AC

## 2023-11-29 MED FILL — Potassium Chloride Inj 2 mEq/ML: INTRAVENOUS | Qty: 40 | Status: AC

## 2023-11-29 MED FILL — Lidocaine HCl Local Preservative Free (PF) Inj 2%: INTRAMUSCULAR | Qty: 14 | Status: AC

## 2023-11-29 MED FILL — Heparin Sodium (Porcine) Inj 1000 Unit/ML: INTRAMUSCULAR | Qty: 30 | Status: AC

## 2023-11-29 MED FILL — Sodium Bicarbonate IV Soln 8.4%: INTRAVENOUS | Qty: 50 | Status: AC

## 2023-11-29 MED FILL — Mannitol IV Soln 20%: INTRAVENOUS | Qty: 500 | Status: AC

## 2023-11-29 SURGICAL SUPPLY — 73 items
ADAPTER CARDIO PERF ANTE/RETRO (ADAPTER) IMPLANT
BAG DECANTER FOR FLEXI CONT (MISCELLANEOUS) ×3 IMPLANT
BLADE STERNUM SYSTEM 6 (BLADE) ×3 IMPLANT
CANISTER SUCT 3000ML PPV (MISCELLANEOUS) ×3 IMPLANT
CANN PRFSN 3/8XCNCT ST RT ANG (MISCELLANEOUS) ×2
CANN PRFSN 3/8XRT ANG TPR 14 (MISCELLANEOUS)
CANNULA MC2 2 STG 36/46 CONN (CANNULA) IMPLANT
CANNULA NON VENT 20FR 12 (CANNULA) ×3 IMPLANT
CANNULA NON VENT 22FR 12 (CANNULA) ×3 IMPLANT
CANNULA PRFSN 3/8XCNCT RT ANG (MISCELLANEOUS) IMPLANT
CANNULA PRFSN 3/8XRT ANG TPR14 (MISCELLANEOUS) IMPLANT
CANNULA SUMP PERICARDIAL (CANNULA) ×3 IMPLANT
CANNULA VRC MALB SNGL STG 36FR (MISCELLANEOUS) IMPLANT
CATH HEART VENT LEFT (CATHETERS) ×3 IMPLANT
CATH ROBINSON RED A/P 18FR (CATHETERS) ×9 IMPLANT
CATH THOR STR 32F SOFT 20 RADI (CATHETERS) ×3 IMPLANT
CATH THORACIC 28FR RT ANG (CATHETERS) ×3 IMPLANT
CIRCUIT VACPAC SAFETY (MISCELLANEOUS) IMPLANT
CLAMP SUTURE YELLOW 5 PAIRS (MISCELLANEOUS) ×3 IMPLANT
CONN 1/2X3/8X3/8 Y GISH (MISCELLANEOUS) IMPLANT
CONNECTOR 1/2X3/8X1/2 3WAY (MISCELLANEOUS) IMPLANT
DEVICE SUT CK QUICK LOAD INDV (Prosthesis & Implant Heart) IMPLANT
DEVICE SUT CK QUICK LOAD MINI (Prosthesis & Implant Heart) IMPLANT
DRAIN CONNECTOR BLAKE 1:1 (MISCELLANEOUS) IMPLANT
DRAPE CV SPLIT W-CLR ANES SCRN (DRAPES) ×3 IMPLANT
DRAPE INCISE IOBAN 66X45 STRL (DRAPES) ×3 IMPLANT
DRAPE PERI GROIN 82X75IN TIB (DRAPES) ×3 IMPLANT
DRSG AQUACEL AG ADV 3.5X10 (GAUZE/BANDAGES/DRESSINGS) ×3 IMPLANT
ELECT CAUTERY BLADE 6.4 (BLADE) ×3 IMPLANT
ELECT REM PT RETURN 9FT ADLT (ELECTROSURGICAL) ×4
ELECTRODE REM PT RTRN 9FT ADLT (ELECTROSURGICAL) ×6 IMPLANT
FELT TEFLON 1X6 (MISCELLANEOUS) ×3 IMPLANT
GAUZE SPONGE 4X4 12PLY STRL (GAUZE/BANDAGES/DRESSINGS) ×3 IMPLANT
GOWN STRL REUS W/ TWL LRG LVL3 (GOWN DISPOSABLE) ×18 IMPLANT
INSERT FOGARTY XLG (MISCELLANEOUS) ×3 IMPLANT
KIT BASIN OR (CUSTOM PROCEDURE TRAY) ×3 IMPLANT
KIT SUT CK MINI COMBO 4X17 (Prosthesis & Implant Heart) IMPLANT
KIT TURNOVER KIT B (KITS) ×3 IMPLANT
LINE VENT (MISCELLANEOUS) IMPLANT
NDL SUT 4 .5 CRC FRENCH EYE (NEEDLE) IMPLANT
NS IRRIG 1000ML POUR BTL (IV SOLUTION) ×18 IMPLANT
ORGANIZER SUTURE GABBAY-FRATER (MISCELLANEOUS) ×3 IMPLANT
PACK E OPEN HEART (SUTURE) ×3 IMPLANT
PACK OPEN HEART (CUSTOM PROCEDURE TRAY) ×3 IMPLANT
PAD ARMBOARD 7.5X6 YLW CONV (MISCELLANEOUS) ×6 IMPLANT
PAD ELECT DEFIB RADIOL ZOLL (MISCELLANEOUS) ×3 IMPLANT
PENCIL BUTTON HOLSTER BLD 10FT (ELECTRODE) ×3 IMPLANT
POSITIONER HEAD DONUT 9IN (MISCELLANEOUS) ×3 IMPLANT
RING TRICUSPID T30 (Prosthesis & Implant Heart) IMPLANT
SET MPS 3-ND DEL (MISCELLANEOUS) IMPLANT
SUT ETHIBOND 2 0 SH (SUTURE) IMPLANT
SUT ETHIBOND 3 0 SH 1 (SUTURE) IMPLANT
SUT MNCRL AB 4-0 PS2 18 (SUTURE) ×6 IMPLANT
SUT PROLENE 4 0 SH DA (SUTURE) ×9 IMPLANT
SUT PROLENE 4-0 RB1 .5 CRCL 36 (SUTURE) ×6 IMPLANT
SUT PROLENE 5 0 RB 2 (SUTURE) IMPLANT
SUT STEEL 6MS V (SUTURE) ×3 IMPLANT
SUT STEEL SZ 6 DBL 3X14 BALL (SUTURE) ×6 IMPLANT
SUT VIC AB 0 CTX36XBRD ANTBCTR (SUTURE) ×6 IMPLANT
SUT VIC AB 2-0 CT1 TAPERPNT 27 (SUTURE) ×6 IMPLANT
SYSTEM SAHARA CHEST DRAIN ATS (WOUND CARE) ×3 IMPLANT
TAG SUTURE CLAMP YLW 5PR (MISCELLANEOUS) ×2
TAPE CLOTH SOFT 2X10 (GAUZE/BANDAGES/DRESSINGS) IMPLANT
TAPE PAPER 2X10 WHT MICROPORE (GAUZE/BANDAGES/DRESSINGS) IMPLANT
TOWEL GREEN STERILE (TOWEL DISPOSABLE) ×3 IMPLANT
TOWEL GREEN STERILE FF (TOWEL DISPOSABLE) ×3 IMPLANT
TRAY FOLEY SLVR 16FR TEMP STAT (SET/KITS/TRAYS/PACK) ×3 IMPLANT
UNDERPAD 30X36 HEAVY ABSORB (UNDERPADS AND DIAPERS) ×3 IMPLANT
VALVE MITRAL SZ 31 (Prosthesis & Implant Heart) IMPLANT
VENT LEFT HEART 12002 (CATHETERS) ×2
VRC MALLEABLE SINGLE STG 36FR (MISCELLANEOUS) ×2
WATER STERILE IRR 1000ML POUR (IV SOLUTION) ×6 IMPLANT
YANKAUER SUCT BULB TIP NO VENT (SUCTIONS) IMPLANT

## 2023-11-29 SURGICAL SUPPLY — 55 items
BAG DECANTER FOR FLEXI CONT (MISCELLANEOUS) ×2 IMPLANT
BLADE CLIPPER SURG (BLADE) ×2 IMPLANT
BLADE SURG 11 STRL SS (BLADE) IMPLANT
CANISTER SUCT 3000ML PPV (MISCELLANEOUS) ×2 IMPLANT
COVER SURGICAL LIGHT HANDLE (MISCELLANEOUS) IMPLANT
DRAPE CARDIOVASC SPLIT 88X140 (DRAPES) ×2 IMPLANT
DRAPE PERI GROIN 82X75IN TIB (DRAPES) ×2 IMPLANT
DRESSING AQUACEL AG SP 3.5X10 (GAUZE/BANDAGES/DRESSINGS) IMPLANT
DRSG AQUACEL AG ADV 3.5X10 (GAUZE/BANDAGES/DRESSINGS) IMPLANT
DRSG AQUACEL AG ADV 3.5X14 (GAUZE/BANDAGES/DRESSINGS) IMPLANT
DRSG AQUACEL AG SP 3.5X10 (GAUZE/BANDAGES/DRESSINGS)
ELECT CAUTERY BLADE 6.4 (BLADE) IMPLANT
ELECT REM PT RETURN 9FT ADLT (ELECTROSURGICAL) ×2
ELECTRODE REM PT RTRN 9FT ADLT (ELECTROSURGICAL) ×4 IMPLANT
GAUZE SPONGE 4X4 12PLY STRL (GAUZE/BANDAGES/DRESSINGS) ×2 IMPLANT
GAUZE SPONGE 4X4 12PLY STRL LF (GAUZE/BANDAGES/DRESSINGS) IMPLANT
GLOVE ECLIPSE 7.5 STRL STRAW (GLOVE) ×4 IMPLANT
GOWN STRL REUS W/ TWL LRG LVL3 (GOWN DISPOSABLE) ×6 IMPLANT
GOWN STRL REUS W/ TWL XL LVL3 (GOWN DISPOSABLE) ×4 IMPLANT
HEMOSTAT SURGICEL 2X14 (HEMOSTASIS) IMPLANT
INSERT FOGARTY XLG (MISCELLANEOUS) IMPLANT
KIT BASIN OR (CUSTOM PROCEDURE TRAY) ×2 IMPLANT
KIT SUCTION CATH 14FR (SUCTIONS) IMPLANT
KIT TURNOVER KIT B (KITS) ×2 IMPLANT
NS IRRIG 1000ML POUR BTL (IV SOLUTION) ×10 IMPLANT
PACK E OPEN HEART (SUTURE) IMPLANT
PAD ARMBOARD 7.5X6 YLW CONV (MISCELLANEOUS) ×4 IMPLANT
PAD ELECT DEFIB RADIOL ZOLL (MISCELLANEOUS) ×2 IMPLANT
POSITIONER HEAD DONUT 9IN (MISCELLANEOUS) ×2 IMPLANT
SPONGE T-LAP 18X18 ~~LOC~~+RFID (SPONGE) IMPLANT
SUT ETHIBOND 2 0 SH 36X2 (SUTURE) IMPLANT
SUT MNCRL AB 4-0 PS2 18 (SUTURE) IMPLANT
SUT PERMA SILK 0 CT1 (SUTURE) IMPLANT
SUT PROLENE 3 0 SH 1 (SUTURE) IMPLANT
SUT PROLENE 4-0 RB1 .5 CRCL 36 (SUTURE) IMPLANT
SUT PROLENE 5 0 C 1 36 (SUTURE) IMPLANT
SUT PROLENE 6 0 C 1 30 (SUTURE) IMPLANT
SUT PROLENE 7 0 BV 1 (SUTURE) IMPLANT
SUT PROLENE 7 0 BV1 MDA (SUTURE) IMPLANT
SUT PROLENE 8 0 BV175 6 (SUTURE) IMPLANT
SUT SILK 1 MH (SUTURE) ×2 IMPLANT
SUT STEEL 6MS V (SUTURE) ×2 IMPLANT
SUT STEEL SZ 6 DBL 3X14 BALL (SUTURE) ×4 IMPLANT
SUT TEM PAC WIRE 2 0 SH (SUTURE) IMPLANT
SUT VIC AB 1 CTX36XBRD ANBCTR (SUTURE) ×4 IMPLANT
SUT VIC AB 2-0 CT1 36 (SUTURE) ×4 IMPLANT
SUT VICRYL 4-0 PS2 18IN ABS (SUTURE) ×4 IMPLANT
SYSTEM SAHARA CHEST DRAIN ATS (WOUND CARE) ×2 IMPLANT
TAPE CLOTH SURG 4X10 WHT LF (GAUZE/BANDAGES/DRESSINGS) IMPLANT
TAPE PAPER 2X10 WHT MICROPORE (GAUZE/BANDAGES/DRESSINGS) IMPLANT
TOWEL GREEN STERILE (TOWEL DISPOSABLE) ×2 IMPLANT
TUBE CONNECTING 20X1/4 (TUBING) IMPLANT
UNDERPAD 30X36 HEAVY ABSORB (UNDERPADS AND DIAPERS) ×2 IMPLANT
WATER STERILE IRR 1000ML POUR (IV SOLUTION) ×4 IMPLANT
YANKAUER SUCT BULB TIP NO VENT (SUCTIONS) IMPLANT

## 2023-11-29 NOTE — Op Note (Signed)
CARDIOVASCULAR SURGERY OPERATIVE NOTE  11/29/2023 MITCHEL TAVAREZ [id number]  Surgeon:  Ashley Akin, MD  First Assistant: Doree Fudge, Gershon Crane [facility]                               An experienced assistant was required given the complexity of this surgery and the standard of surgical care. The assistant was needed for exposure, dissection, suctioning, retraction of delicate tissues and sutures, instrument exchange and for overall help during this procedure.     Preoperative Diagnosis:  Tamponade sp MV/TV surgery  Postoperative Diagnosis:  Same   Procedure: Re exploration for bleeding   Anesthesia:  General Endotracheal   Clinical History/Surgical Indication:  Pt had just had MV replacement and TV repair and had evidence of tamponade by hemodynamic shock with ongoing bleeding  Findings: Tamponade with large amount of clot in pericardium and pleural spaces. No suture line source of bleeding. Apparent Coagulopathy with all the raw surfaces oozing. After completion of exploration and factor replacement pt still had some on going drainage. Will continue to replace factors  Preparation:  The patient was seen in the preoperative holding area and the correct patient, correct operation were confirmed with the patient after reviewing the medical record and catheterization. The consent was signed by me. Preoperative antibiotics were given. A pulmonary arterial line and radial arterial line were placed by the anesthesia team. The patient was taken back to the operating room and positioned supine on the operating room table. After being placed under general endotracheal anesthesia by the anesthesia team a foley catheter was placed. The neck, chest, abdomen, and both legs were prepped with betadine soap and solution and draped in the usual sterile manner. A surgical time-out was taken and the correct patient and operative procedure were confirmed with the nursing and anesthesia  staff.  Operation: The previous median sternotomy incision was reopened and the sternal wires removed.  With separation of the sternal edges there was immediate improvement in hemodynamics.  Significant amount of clot was removed from the pericardial space and all suture lines were exhaustively explored with no source of bleeding.  The only area that appeared to have significant ooze was the left atrial suture line.  Secondary to this CoSeal was then applied to the entire suture line and packed with Surgicel.  This appeared to have significant improvement in the bleeding and both pleural spaces were drained.  Mediastinum was then copiously irrigated and the sternum was reapproximated with interrupted stainless steel wires and the presternal subcutaneous tissue and skin were closed multilayer's absorbable suture.  Sterile dressings were applied.

## 2023-11-29 NOTE — Progress Notes (Signed)
PCCM f/u: She has good UOP 95 cc in 90 min Vaso 0.04 mcg/min, Levophed 5 mcg/min, Epi 3 mcg/min, Precedex and Propofol for sedation. PRVC 26/420/+12/50%, ABG 7.31/43/161/99% In sinus, afebrile, SpO2 100%, MAP 80 mmHg, RR 26 Labs, notes, images were reviewed PCXR: left lung partial atelectasis Bedside eval [place]: left sided small pleural effusion and hepatization of the LLL Assessment: Shock post cardiac surgery (MV/TR repair and tamponade evacuation) Hemorrhagic shock CKD-3b pHTN NICM EF 45% Dilutional thrombocytopenia HypoCa PLAN: Check Mg Replace Ca Decreased PEEP to +8, and FiO2 45% Am labs Wean Vasopressin, Epi, and Levophed Mucomyst neb Atrovent q 6 hrs  D/c BB till more stable Amiodarone was added by thoracic surgery  Fibrogen Add Fentanyl for pain relief I/O chart  CCM 30 min

## 2023-11-29 NOTE — Progress Notes (Signed)
  Echocardiogram Echocardiogram Transesophageal has been performed.  Vanessa [name] 11/29/2023, 4:17 PM

## 2023-11-29 NOTE — Brief Op Note (Signed)
11/29/2023  5:05 PM  PATIENT:  Vanessa [name]  72 y.o. female  PRE-OPERATIVE DIAGNOSIS:  Post-Op Bleeding  POST-OPERATIVE DIAGNOSIS:  Post-Op Bleeding  PROCEDURE:   EXPLORATION POST OPERATIVE OPEN HEART   SURGEON:  Surgeons and Role:    Eugenio Hoes, MD - Primary  PHYSICIAN ASSISTANTS: 1. Gershon Crane PA-C 2. Doree Fudge PA-C   ASSISTANTS: Virgilio Frees RNFA   ANESTHESIA:   general  EBL: Per anesthesia  BLOOD ADMINISTERED: 4 CC PRBC, 4 FFP, and 1 PLTS  DRAINS:  Chest tubes placed in the mediastinal and pleural spaces    COUNTS CORRECT:  YES  DICTATION: .Dragon Dictation  PLAN OF CARE: Admit to inpatient   PATIENT DISPOSITION:  ICU - intubated and hemodynamically stable.   Delay start of Pharmacological VTE agent (>24hrs) due to surgical blood loss or risk of bleeding: yes

## 2023-11-29 NOTE — Op Note (Signed)
CARDIOVASCULAR SURGERY OPERATIVE NOTE  11/29/2023 Vanessa [name] [id number]  Surgeon:  Ashley Akin, MD  First Assistant: Aloha Gell [facility]                               An experienced assistant was required given the complexity of this surgery and the standard of surgical care. The assistant was needed for exposure, dissection, suctioning, retraction of delicate tissues and sutures, instrument exchange and for overall help during this procedure.     Preoperative Diagnosis:  Severe Mitral Regurgitation sp mitral clip x 2                                             Moderate Tricuspid Regurgitation  Postoperative Diagnosis:  Same   Procedure: Mitral Valve Replacement with a Mosaic Porcine 31 mm Tissue prosthesis Tricuspid Valve repair with a 30 mm MC3 Annuloplasty ring  Anesthesia:  General Endotracheal   Clinical History/Surgical Indication:  72 yo female with NYHA class 2 symptoms of recurrent MR following Mitral clip placement and with depressed LV function and moderate PHTN with no CAD. PT also with moderate TR and will need to assess TV annular size due to possible need for TV repair with PHTN.    Findings: The ascending aorta was very thin walled. Required additional sutures during cannulation to control bleeding The mitral valve had severe MR and following replacement was well seated and mean gradient of The tricuspid valve had trivial regurgitation following annuloplasty    Preparation:  The patient was seen in the preoperative holding area and the correct patient, correct operation were confirmed with the patient after reviewing the medical record and catheterization. The consent was signed by me. Preoperative antibiotics were given. A pulmonary arterial line and radial arterial line were placed by the anesthesia team. The patient was taken back to the operating room and positioned supine on the operating room table. After being placed under general endotracheal  anesthesia by the anesthesia team a foley catheter was placed. The neck, chest, abdomen, and both legs were prepped with betadine soap and solution and draped in the usual sterile manner. A surgical time-out was taken and the correct patient and operative procedure were confirmed with the nursing and anesthesia staff.  Operation: Median sternotomy incision was then created and sternal valve the sternal saw.  Simultaneously heparin was delivered and a pericardial well developed.  Aorta was cannulated with a 20 [place] Starns aortic cannula and due to a very thin-walled aorta several additional pursestrings of 4-0 Prolene pledgeted sutures were required to control bleeding.  A straight 36 malleable cannulas placed in the right atrial appendage and direct towards the inferior vena cava.  With adequate confirmation of anticoagulation cardiopulmonary bypass was instituted and an additional 28 French right angle cannulas placed in the superior vena cava.  Both cavae were controlled with cable tourniquets.  An antegrade cardioplegia catheter was placed in the ascending aorta. Aortic cross-clamp was placed and 1300 cc of [place] blood cardioplegia was delivered antegrade.  Should be noted that a reanimation dose was delivered just prior to cross-clamp removal.  The left atrium was opened in the atrial groove and adequate exposure of the mitral valve was obtained.  Left atrial appendage was oversewn with a running 4-0 Prolene suture. The mitral valve required to  be resected secondary to the abundant chordal structures which were involved with both the posterior and anterior leaflet. 3-0 Ethibond sutures were placed circa manually with the pledgets on the atrial side.  A 31 mm Mosaic tissue prosthesis was then secured with the cor knot system. The left atrium was then closed over ventricular sump with running 4-0 Prolene sutures. The cable tourniquets were secured in the right atrium open and adequate exposure of the  tricuspid valve was obtained.  3-0 Ethibond sutures were placed in the annulus in anticipation of the MC3 ring.  This was secured with the cor knot system and it was a 30 mm annuloplasty ring.  The right atrium was closed with a running Prolene 4-0 Prolene suture. The patient in headdown position aortic cross-clamp was removed and multiple de-airing maneuvers performed.  The ventricular sump was removed and the patient was then weaned from cardiopulmonary bypass on inotropic support.  With adequate hemodynamics protamine was delivered the patient was decannulated sites oversewn necessary.  Chest tubes were brought inferior stab was and secured the sternum was reapproximated with interrupted stainless steel wire and the presternal subcutaneous tissue and skin were closed multiple layers absorbable suture sterile dressings were applied.

## 2023-11-29 NOTE — Transfer of Care (Signed)
Immediate Anesthesia Transfer of Care Note  [name]  Procedure(s) Performed: MITRAL VALVE (MV) REPLACEMENT USING MOSAIC 310CINCH TISSUE VALVE (Chest) TRICUSPID VALVE REPAIR USING MC3 EDWARDS TRICUSPID ANNULOPLASTY RING TRANSESOPHAGEAL ECHOCARDIOGRAM  Patient Location: ICU  Anesthesia Type:General  Level of Consciousness: Patient remains intubated per anesthesia plan  Airway & Oxygen Therapy: Patient remains intubated per anesthesia plan and Patient placed on Ventilator (see vital sign flow sheet for setting)  Post-op Assessment: Report given to RN and Post -op Vital signs reviewed and stable  Post vital signs: Reviewed and stable  Last Vitals:  Vitals Value Taken Time  BP 104/78 11/29/23 1242  Temp 35.3 C 11/29/23 1246  Pulse 82 11/29/23 1246  Resp 22 11/29/23 1246  SpO2 98 % 11/29/23 1246  Vitals shown include unfiled device data.  Last Pain:  Vitals:   11/29/23 0646  TempSrc:   PainSc: 0-No pain         Complications:  Encounter Notable Events  Notable Event Outcome Phase Comment  Difficult to intubate - expected  Intraprocedure Filed from anesthesia note documentation.

## 2023-11-29 NOTE — Consult Note (Signed)
NAME:  Vanessa [name], MRN:  [id number], DOB:  1951/02/09, LOS: 0 ADMISSION DATE:  11/29/2023, CONSULTATION DATE:  11/29/23 REFERRING MD:  Dr. Leafy Ro, CHIEF COMPLAINT:  postop   History of Present Illness:  72yoF with PMH significant for HTN, NICM, HFrEF (EF40-45%), PHTN, CKD and hx of prior severe MR s/p prior mitral clip x 2 in 2020.  Pt with progressive DOE found to have increasing MR  and moderate TR despite adjustments in GDMT.  Not felt to be candidate for additional mitral clip.  Repeat LHC without CAD but showed moderate PHTN.  Underwent mitral valve replacement with tissue valve and tricuspid valve repair by Dr. Leafy Ro on 12/30.  PCCM consulted to assist with vent and medical management post-operatively in ICU.   Pertinent  Medical History  Never smoker, MR w/ previous Mitral clip x 2 (2020), NICM, HFrEF, PHTN, HTN, CKD3b  Significant Hospital Events: Including procedures, antibiotic start and stop dates in addition to other pertinent events   MVR by Dr. Leafy Ro  Interim History / Subjective:  Transfused 1u PRBC, 1u PLTs, albumin x1, LR 1.7L, cell saver in OR, w/ EBL 1.35L, UOP 560 ml Epi 4, NE 8, vaso 0.03, milrinone 0.5, txa, insulin, dex 0.7  Objective   Blood pressure 122/71, pulse 64, temperature 97.6 F (36.4 C), temperature source Oral, resp. rate 17, height 5\' 3"  (1.6 m), weight 83 kg, SpO2 97%.        Intake/Output Summary (Last 24 hours) at 11/29/2023 1111 Last data filed at 11/29/2023 1043 Gross per 24 hour  Intake 1450 ml  Output 490 ml  Net 960 ml   Filed Weights   11/29/23 0625  Weight: 83 kg   Examination: General:  critically ill adult female sedated on MV HEENT: MM pink/moist, ETT/ OGT, pupils pinpoint Neuro: sedated CV: rr, NSR with PVCs, sternal dressing dry, Ctx2- out- small amount of shadowing marked, pacing wires  PULM:  MV supported, clear bilaterally  s/p retraction of ETT GI: soft, bshypo, ND, foley- cyu Extremities:  warm/dry, no LE edema  Skin: no rashes   CI 1.37, CO 2.55, PA 44/23 (30) Labs reviewed  Resolved Hospital Problem list     Assessment & Plan:  Severe  MR( s/p prior mitral clip x 2 in 2020) now s/p tissue MVR Moderate TR s/p TV repair  Moderate PHTN NICM/ HFrEF, EF 40-45% Post bypass vasoplegia Acute postoperative respiratory insufficiency Expected post-operative ABLA Expected post-operative consumptive thrombocytopenia  CKD3b P:  - post-op management per TCTS - rapid wean per TCTS protocol if remains stable> precedex, w/ multimodal pain management with bowel regimen - VAP/ PPI - CXR/ ABG, CBC, BMET, coags now - cont pressors- epi, NE, vaso and intropes- milrinone.  Goal SBP > 90, MAP >70-90.  Albumin boluses prn for low SV.  - trend CVP/ coox/ hemodynamics - completing txa infusion - transfuse per TCTS, Hgb goal > 7 - warming measures prn - EKG now, tele monitoring/ pacing prn - mediastinal drains per TCTS - ASA, statin to resume next day  - metoprolol BID once off pressors - complete post-op antibiotics - insulin gtt per endotool - optimize electrolytes-K/ Mag/ Ca, replete PRN   Best Practice (right click and "Reselect all SmartList Selections" daily)   Diet/type: NPO DVT prophylaxis SCD Pressure ulcer(s): N/A GI prophylaxis: PPI Lines: Central line and Arterial Line Foley:  Yes, and it is still needed Code Status:  full code Last date of multidisciplinary goals of care discussion [per  primary]  Labs   CBC: Recent Labs  Lab 11/25/23 1452 11/29/23 0812 11/29/23 0915 11/29/23 0922 11/29/23 0942 11/29/23 1010 11/29/23 1044  WBC 5.7  --   --   --   --   --   --   HGB 11.3*   < > 7.5* 7.5* 7.8* 7.8* 7.8*  HCT 36.8   < > 22.0* 22.0* 23.0* 23.0* 23.0*  MCV 93.4  --   --   --   --   --   --   PLT 199  --   --   --   --   --   --    < > = values in this interval not displayed.    Basic Metabolic Panel: Recent Labs  Lab 11/25/23 1452 11/29/23 0812  11/29/23 0843 11/29/23 0915 11/29/23 0922 11/29/23 0942 11/29/23 1010 11/29/23 1044  NA 139 139 139 139 140 139 139 138  K 3.8 4.1 3.7 3.9 4.0 4.0 4.7 4.5  CL 105 106 105  --   --  104  --  106  CO2 23  --   --   --   --   --   --   --   GLUCOSE 87 110* 115*  --   --  123*  --  149*  BUN 46* 38* 37*  --   --  37*  --  40*  CREATININE 1.52* 1.60* 1.40*  --   --  1.40*  --  1.40*  CALCIUM 9.1  --   --   --   --   --   --   --    GFR: Estimated Creatinine Clearance: 37 mL/min (A) (by C-G formula based on SCr of 1.4 mg/dL (H)). Recent Labs  Lab 11/25/23 1452  WBC 5.7    Liver Function Tests: Recent Labs  Lab 11/25/23 1452  AST 16  ALT 11  ALKPHOS 36*  BILITOT 2.1*  PROT 7.6  ALBUMIN 3.6   No results for input(s): "LIPASE", "AMYLASE" in the last 168 hours. No results for input(s): "AMMONIA" in the last 168 hours.  ABG    Component Value Date/Time   PHART 7.395 11/29/2023 1010   PCO2ART 38.7 11/29/2023 1010   PO2ART 347 (H) 11/29/2023 1010   HCO3 23.7 11/29/2023 1010   TCO2 25 11/29/2023 1044   ACIDBASEDEF 1.0 11/29/2023 1010   O2SAT 100 11/29/2023 1010     Coagulation Profile: Recent Labs  Lab 11/25/23 1452  INR 1.1    Cardiac Enzymes: No results for input(s): "CKTOTAL", "CKMB", "CKMBINDEX", "TROPONINI" in the last 168 hours.  HbA1C: Hgb A1c MFr Bld  Date/Time Value Ref Range Status  11/25/2023 02:52 PM 6.0 (H) 4.8 - 5.6 % Final    Comment:    (NOTE)         Prediabetes: 5.7 - 6.4         Diabetes: >6.4         Glycemic control for adults with diabetes: <7.0   12/13/2018 08:44 AM 6.2 (H) 4.8 - 5.6 % Final    Comment:    (NOTE) Pre diabetes:          5.7%-6.4% Diabetes:              >6.4% Glycemic control for   <7.0% adults with diabetes     CBG: No results for input(s): "GLUCAP" in the last 168 hours.  Review of Systems:   unable  Past Medical History:  She,  has  a past medical history of CHF (congestive heart failure) (HCC), CKD  (chronic kidney disease), Heart murmur, Hypertension, Moderate mitral regurgitation, Multinodular goiter, Nonischemic cardiomyopathy (HCC), Pulmonary HTN (HCC), and PVC's (premature ventricular contractions).   Surgical History:   Past Surgical History:  Procedure Laterality Date   CARDIAC CATHETERIZATION  2003   normal coronary arteries   CARDIAC CATHETERIZATION  2013  Nov   h/o nonischemic DCM EF 35-40% increase LVEDP    HYSTEROSCOPY WITH D & C  12/02/2012   Procedure: DILATATION AND CURETTAGE /HYSTEROSCOPY;  Surgeon: Dorien Chihuahua. Richardson Dopp, MD;  Location: [facility];  Service: Gynecology;  Laterality: N/A;  cervical polypectomy   LEFT AND RIGHT HEART CATHETERIZATION WITH CORONARY ANGIOGRAM N/A 02/15/2015   Procedure: LEFT AND RIGHT HEART CATHETERIZATION WITH CORONARY ANGIOGRAM;  Surgeon: Corky Crafts, MD;  Location: [facility] CATH LAB;  Service: Cardiovascular;  Laterality: N/A;   RIGHT/LEFT HEART CATH AND CORONARY ANGIOGRAPHY N/A 10/12/2023   Procedure: RIGHT/LEFT HEART CATH AND CORONARY ANGIOGRAPHY;  Surgeon: Laurey Morale, MD;  Location: [facility] INVASIVE CV LAB;  Service: Cardiovascular;  Laterality: N/A;   TEE WITHOUT CARDIOVERSION N/A 10/11/2018   Procedure: TRANSESOPHAGEAL ECHOCARDIOGRAM (TEE);  Surgeon: Laurey Morale, MD;  Location: [facility] ENDOSCOPY;  Service: Cardiovascular;  Laterality: N/A;   TEE WITHOUT CARDIOVERSION N/A 09/07/2023   Procedure: TRANSESOPHAGEAL ECHOCARDIOGRAM;  Surgeon: Laurey Morale, MD;  Location: [facility] INVASIVE CV LAB;  Service: Cardiovascular;  Laterality: N/A;   TRANSCATHETER MITRAL EDGE TO EDGE REPAIR N/A 12/14/2018   Procedure: MITRAL VALVE REPAIR;  Surgeon: Tonny Bollman, MD;  Location: [facility] INVASIVE CV LAB;  Service: Cardiovascular;  Laterality: N/A;     Social History:   reports that she has never smoked. She has never used smokeless tobacco. She reports that she does not drink alcohol and does not use drugs.   Family History:  Her family history includes Breast cancer  (age of onset: 56) in her sister; Breast cancer (age of onset: 21) in her mother; Heart disease in her father. There is no history of Colon cancer, Esophageal cancer, Rectal cancer, Stomach cancer, or Thyroid disease.   Allergies No Known Allergies   Home Medications  Prior to Admission medications   Medication Sig Start Date End Date Taking? Authorizing Provider  aspirin 81 MG EC tablet Take 1 tablet (81 mg total) by mouth daily. 10/21/12  Yes Turner, Cornelious Bryant, MD  Calcium Carb-Cholecalciferol (CALCIUM + VITAMIN D3 PO) Take 1 tablet by mouth in the morning.   Yes [provider]  carvedilol (COREG) 6.25 MG tablet Take 1 tablet by mouth twice daily 11/03/23  Yes Laurey Morale, MD  empagliflozin (JARDIANCE) 10 MG TABS tablet TAKE 1 TABLET BY MOUTH ONCE DAILY BEFORE BREAKFAST 03/17/23  Yes Laurey Morale, MD  ibandronate (BONIVA) 150 MG tablet Take 150 mg by mouth every 30 (thirty) days.   Yes [provider]  torsemide (DEMADEX) 20 MG tablet TAKE 2 TABLETS BY MOUTH ONCE DAILY ALTERNATING WITH TAKING 1 TABLET THE NEXT DAY 10/21/23  Yes Laurey Morale, MD  Vericiguat (VERQUVO) 2.5 MG TABS Take 1 tablet (2.5 mg total) by mouth daily. 05/26/23  Yes Laurey Morale, MD     Critical care time: 50 mins       Posey Boyer, MSN, AG-ACNP-BC [facility] 11/29/2023, 1:06 PM  See Amion for pager If no response to pager , please call [phone] until 7pm After 7:00 pm call Elink  [phone]

## 2023-11-29 NOTE — Progress Notes (Signed)
PCCM INTERVAL PROGRESS NOTE  ABG    Component Value Date/Time   PHART 7.288 (L) 11/29/2023 1941   PCO2ART 45.5 11/29/2023 1941   PO2ART 170 (H) 11/29/2023 1941   HCO3 21.9 11/29/2023 1941   TCO2 23 11/29/2023 1941   ACIDBASEDEF 5.0 (H) 11/29/2023 1941   O2SAT 99 11/29/2023 1941      pH down from 7.35. CO2 up from 38. Will increase RR to 26. RT notified.      Joneen Roach, AGACNP-BC [facility]  See Amion for personal pager PCCM on call pager [phone] until 7pm. Please call Elink 7p-7a. [phone]  11/29/2023 7:48 PM

## 2023-11-29 NOTE — Anesthesia Preprocedure Evaluation (Addendum)
Anesthesia Evaluation  Patient identified by MRN, date of birth, ID band Patient unresponsive    Reviewed: Allergy & Precautions, H&P , NPO status , Patient's Chart, lab work & pertinent test results  Airway Mallampati: Intubated  TM Distance: >3 FB Neck ROM: Full    Dental no notable dental hx. (+) Teeth Intact, Dental Advisory Given   Pulmonary neg pulmonary ROS   Pulmonary exam normal breath sounds clear to auscultation       Cardiovascular hypertension, +CHF  + Valvular Problems/Murmurs  Rhythm:Regular Rate:Normal     Neuro/Psych negative neurological ROS  negative psych ROS   GI/Hepatic negative GI ROS, Neg liver ROS,,,  Endo/Other   Hyperthyroidism   Renal/GU Renal disease  negative genitourinary   Musculoskeletal   Abdominal   Peds  Hematology negative hematology ROS (+)   Anesthesia Other Findings   Reproductive/Obstetrics negative OB ROS                             Anesthesia Physical Anesthesia Plan  ASA: 4 and emergent  Anesthesia Plan: General   Post-op Pain Management: Minimal or no pain anticipated   Induction: Intravenous  PONV Risk Score and Plan: 3 and Treatment may vary due to age or medical condition  Airway Management Planned: Oral ETT  Additional Equipment:   Intra-op Plan:   Post-operative Plan: Post-operative intubation/ventilation  Informed Consent: I have reviewed the patients History and Physical, chart, labs and discussed the procedure including the risks, benefits and alternatives for the proposed anesthesia with the patient or authorized representative who has indicated his/her understanding and acceptance.     Dental advisory given  Plan Discussed with: CRNA  Anesthesia Plan Comments:        Anesthesia Quick Evaluation

## 2023-11-29 NOTE — Progress Notes (Signed)
Pt taken off the vent and bagged by anesthesia to the OR. RT will continue to be available as needed.

## 2023-11-29 NOTE — Anesthesia Procedure Notes (Addendum)
Procedure Name: Intubation Date/Time: 11/29/2023 7:50 AM  Performed by: Randon Goldsmith, CRNAPre-anesthesia Checklist: Patient identified, Emergency Drugs available, Suction available and Patient being monitored Patient Re-evaluated:Patient Re-evaluated prior to induction Oxygen Delivery Method: Circle system utilized Preoxygenation: Pre-oxygenation with 100% oxygen Induction Type: IV induction Ventilation: Mask ventilation without difficulty, Oral airway inserted - appropriate to patient size and Two handed mask ventilation required Laryngoscope Size: Glidescope and 3 Grade View: Grade II Tube type: Oral Tube size: 8.0 mm Number of attempts: 2 Airway Equipment and Method: Stylet, Oral airway and Video-laryngoscopy Placement Confirmation: ETT inserted through vocal cords under direct vision, positive ETCO2 and breath sounds checked- equal and bilateral Secured at: 24 cm Tube secured with: Tape Dental Injury: Teeth and Oropharynx as per pre-operative assessment  Difficulty Due To: Difficulty was anticipated, Difficult Airway- due to reduced neck mobility and Difficult Airway- due to anterior larynx Future Recommendations: Recommend- induction with short-acting agent, and alternative techniques readily available Comments: Previous difficult airway noted; all equipment immediately available. DL x1 with Glide 3 with grade 2 view; blade removed and head repositioned; DL x2 with glide 3 grade 2 view; ETT passed through cords. +chest rise and fall, +EtCO2, +BBS

## 2023-11-29 NOTE — Progress Notes (Signed)
Patient came to [facility] about 12:35.  Patient was hemodynamically stable with pressors.  Patient was draining a large amount out of the chest tubes and was requiring an increase in pressor doses.  CCM was made aware and at bedside most of the time.  This trend continued and Dr. Leafy Ro was made aware and decided to take the patient back to the OR to explore what was going on.  FFP and Plasma were hung and running when patient went back to the OR about 1545. Dr. Leafy Ro updated patient's contact at the bedside.

## 2023-11-29 NOTE — Progress Notes (Signed)
S/p return from OR for re-exploration found to have large amount of clot in pericardium c/w tamponade and pleural spaces (drained), no suture line source of bleeding, but all raw surfaces oozing.  S/p 3u PRBC, 4 FFP, 1plt, and cryo ordered.    Vasopressor support improved from prior.  NE 5, epi 3, vaso, milrinone 0.4  ABG on return, showing Hgb 6.8, iCa 0.87 CXR post op with stable ETT, left lung still with atelectatic changes and likely residual effusion pending official read  P:  - additional 1u PRBC now. Cryo ordered/ pending from OR.  Will repeat H/H and TEG post transfusions - leave intubated overnight, increase PEEP to 12  - CXR in am  - closely monitor output/ CT drainage and hemodynamics  - additional calcium gluconate 1gm - warming measures        Vanessa Boyer, MSN, AG-ACNP-BC  Pulmonary & Critical Care 11/29/2023, 6:46 PM  See Amion for pager If no response to pager , please call [phone] until 7pm After 7:00 pm call Elink  [phone]

## 2023-11-29 NOTE — Progress Notes (Signed)
Pt arrived back from OR. Placed on previous vent settings at this time. RT will continue to be available as needed.

## 2023-11-29 NOTE — Anesthesia Procedure Notes (Signed)
Arterial Line Insertion Start/End12/30/2024 7:04 AM, 11/29/2023 7:06 AM Performed by: Randon Goldsmith, CRNA, CRNA  Patient location: Pre-op. Preanesthetic checklist: patient identified, IV checked, site marked, risks and benefits discussed, surgical consent, monitors and equipment checked, pre-op evaluation, timeout performed and anesthesia consent Lidocaine 1% used for infiltration Left, radial was placed Catheter size: 20 G Hand hygiene performed , maximum sterile barriers used  and Seldinger technique used Allen's test indicative of satisfactory collateral circulation Attempts: 1 Procedure performed without using ultrasound guided technique. Following insertion, dressing applied. Post procedure assessment: normal and unchanged  Patient tolerated the procedure well with no immediate complications.

## 2023-11-29 NOTE — Anesthesia Postprocedure Evaluation (Signed)
Anesthesia Post Note  [name]  Procedure(s) Performed: MITRAL VALVE (MV) REPLACEMENT USING MOSAIC 310CINCH TISSUE VALVE (Chest) TRICUSPID VALVE REPAIR USING MC3 EDWARDS TRICUSPID ANNULOPLASTY RING TRANSESOPHAGEAL ECHOCARDIOGRAM     Patient location during evaluation: ICU Anesthesia Type: General Level of consciousness: sedated Pain management: pain level controlled Vital Signs Assessment: post-procedure vital signs reviewed and stable Respiratory status: patient remains intubated per anesthesia plan and patient on ventilator - see flowsheet for VS Cardiovascular status: stable Postop Assessment: no apparent nausea or vomiting Anesthetic complications: yes   Encounter Notable Events  Notable Event Outcome Phase Comment  Difficult to intubate - expected  Intraprocedure Filed from anesthesia note documentation.    Last Vitals:  Vitals:   11/29/23 0625  BP: 122/71  Pulse: 64  Resp: 17  Temp: 36.4 C  SpO2: 97%    Last Pain:  Vitals:   11/29/23 0646  TempSrc:   PainSc: 0-No pain                 Mariann Barter

## 2023-11-29 NOTE — Hospital Course (Addendum)
History of Present Illness:     Vanessa [name] is a very pleasant 72 yo female who has had a long history of Mitral Valve disease. Pt was found to have non ischemic cardiomyopathy and severe MR and underwent Mitral Clip x2 in 2020. Pt over the years has had follow up with increasing MR and EF 40-45%. She has been suffering from exertional DOE mostly with climbing stairs and on inclines. She has lower ext edema when she becomes fluid overloaded and has been on GDMT adjustments due to side effects. Recently she has developed increasing DOE, she underwent TEE to determine if additional Mitral clip placement would be an option and unfortunately felt not to be a candidate. She underwent cath with no CAD but with moderate PHTN. She has not had atrial fibrillation issues. Pt very anxious to proceed as soon as possible to "get this behind her."  Dr. Leafy Ro reviewed the patient's diagnostic studies and determined she would benefit from surgical intervention. He reviewed the treatment options as well as the risks and benefits of surgery. [name] was agreeable to proceed with surgery.  Hospital Course: The patient presented to [facility] on 12/30 and was brought to the operating room. She underwent mitral valve replacement utilizing a 31mm Mosaic Bioprosthetic mitral valve and tricuspid valve repair utilizing a 30mm Edwards tricuspid annuloplasty ring. She tolerated the procedure well and was transferred to the SICU in stable condition. Patient returned to the OR for exploration because of bleeding and hemodynamic compromise. Clot was removed and no source of bleeding was found. The patient was returned to the SICU in stable condition. The patient was extubated the following morning without complication. She was acidotic and required Epinephrine and Milrinone. She had CKD with a baseline creatinine of about 1.4. Temporary pacing was continued for complete heart block. By post-op day 2, she continued to require  inotropic and vasopressor support that was managed by the advanced heart failure team. Echocardiogram 01/01 showed LVEF 40-45% and a small pericardial effusion without clear sign of tamponade although it was a technically difficult study. Creatinine trended up so diuretic was originally withheld but pulmonary pressures were elevated and UO stopped so aggressive diuresis was started. She developed an AKI on CKD, creatinine peaked at 2.76. She developed CHB and was DDD paced but developed atrial fibrillation/aflutter and was transitioned to VVI pacing at 60. She was started on IV Amiodarone.  She will likely require cardioversion when more medically stable.  Her chest tubes were removed on POD3 without complication. She required increasing inotropic support. She developed respiratory failure and was reintubated on 01/02. Echo done showed RV function reduced and small pericardial effusion (no tamponade). She developed renal failure secondary to vasoplegic/septic shock. Nephrology was consulted and started CRRT. She was started on empiric Meropenem for sepsis. The advanced heart failure team was consulted and felt she was not a good candidate for [facility] ECMO.  She was transfused for hemoglobin less than 8 on 01/04.  By postop day #12 she remains vasopressor dependent.  A repeat echocardiogram showed EF 40 to 45% with RV systolic dysfunction with continued ongoing management by the AHF team. She developed leukocytosis and was given a line holiday. Zosyn and Linezolid were restarted, white count continued to rise and zostn was transitioned to Meropenem. Per nephrology she was given a trial of Lasix with intermittent HD.  This was not felt to be successful and CRRT was resumed. RHC on 1/17 showed low filling pressures, mild pulmonary artery  hypertension, and low cardiac output. Milrinone was restarted.

## 2023-11-29 NOTE — Consult Note (Addendum)
Advanced Heart Failure Team Consult Note   Primary Physician: Milus Height, PA Cardiologist:  None  Reason for Consultation: Cardiogenic vs. Hypovolemic shock  HPI:    Vanessa [name] is seen today for evaluation of cardiogenic vs. hypovolemic shock at the request of Dr. Leafy Ro.   Vanessa [name] is a 72 y.o. with history of nonischemic cardiomyopathy and severe MR s/p Mitral Clip x2 (2020), pHTN, and CKD IIIb.  Patient has had a low EF recognized since at least 9/14, when echo showed EF 40-45%.  In 2024-06-20, her child died, which led to significant stress.  She developed worsening exertional dyspnea and subsequent echo showed a fall in EF to 25-30%.  She had L/RHC in 3/16 with no significant coronary disease detected and moderately elevated right and left heart filling pressures along with pulmonary venous hypertension.  Echo was done 12/16: EF 45%, diffuse hypokinesis. She had a repeat echo 11/17 => EF 45% with moderately dilated LV, moderate MR.  Echo 11/18 showed EF stable at 45% with mild LV dilation and mildly decreased RV systolic function, moderate MR.    Spironolactone was stopped due to elevated K. She apparently did not tolerate Veltassa well.    Echo 11/19: EF 30-35% with mild LV hypertrophy, mild-moderately decreased RV systolic function, possible severe MR with restricted posterior leaflet, PASP 50 mmHg, severe LAE. TEE 11/19 showed severe MR with restricted posterior leaflet.    In 1/20, she had Mitraclip with reduction in MR to 1+.     Echo 9/20: EF 30-35%, severe LAE, normal RV, s/p Mitraclip with mild-moderate MR and mild MS.    She was unable to tolerate Bidil due to lightheadedness.    Echo 1/21: EF 40-45%, RV normal, biatrial dilation, mild mitral stenosis mean 5 mmHg, mild-moderate MR (s/p Mitraclip).     Labs done in 8/23 showing K up to 6.1 with AKI/creatinine 2.88.  She was admitted and K was treated.  Entresto, spironolactone, Coreg, and Veltassa were  stopped. Torsemide was decreased to 10 mg daily.    Echo 9/23: EF stable 40-45%, mild MS (mean gradient 7 mmHg) and mild to moderate MR s/p Mitraclip.    Echo in 9/24 showed EF 40-45%, mild LV dilation and mild LVH, mildly decreased RV systolic function, PASP 47 mmHg, s/p Mitraclip with mean gradient 6 mmHg, suspect severe mitral regurgitation, IVC normal, small secundum ASD.  TEE in 10/24 showed EF 40-45%, mild LV dilation, moderate RV dysfunction with mild RV enlargement, severe LAE, 2 Mitraclips in place with mean gradient 4 mmHg and severe eccentric MR from between the 2 clips with ERO 0.54 cm^2 and peak RV-RA gradient 63 mmHg.  Dr. Shirlee Latch reviewed the TEE with structural heart colleagues, and there does not appear to be room between the 2 existing Mitraclips to place a 3rd clip.   R/LHC arranged (11/24) as part of pre-surgical evaluation for MVR, which showed no significant CAD, normal RA pressure with mildly elevated PCWP, preserved CO and PAPi, and moderate mixed pulmonary arterial and pulmonary venous hypertension, likely due to mitral valve regurgitation.  She elected to undergo mitral valve replacement with tissue valve and tricuspid valve repair with Dr. Leafy Ro 11/29/23.   Home Medications Prior to Admission medications   Medication Sig Start Date End Date Taking? Authorizing Provider  aspirin 81 MG EC tablet Take 1 tablet (81 mg total) by mouth daily. 10/21/12  Yes Turner, Cornelious Bryant, MD  Calcium Carb-Cholecalciferol (CALCIUM + VITAMIN D3 PO) Take 1 tablet  by mouth in the morning.   Yes [provider]  carvedilol (COREG) 6.25 MG tablet Take 1 tablet by mouth twice daily 11/03/23  Yes Laurey Morale, MD  empagliflozin (JARDIANCE) 10 MG TABS tablet TAKE 1 TABLET BY MOUTH ONCE DAILY BEFORE BREAKFAST 03/17/23  Yes Laurey Morale, MD  ibandronate (BONIVA) 150 MG tablet Take 150 mg by mouth every 30 (thirty) days.   Yes [provider]  torsemide (DEMADEX) 20 MG tablet TAKE  2 TABLETS BY MOUTH ONCE DAILY ALTERNATING WITH TAKING 1 TABLET THE NEXT DAY 10/21/23  Yes Laurey Morale, MD  Vericiguat (VERQUVO) 2.5 MG TABS Take 1 tablet (2.5 mg total) by mouth daily. 05/26/23  Yes Laurey Morale, MD    Past Medical History: Past Medical History:  Diagnosis Date   CHF (congestive heart failure) (HCC)    CKD (chronic kidney disease)    Heart murmur    Hypertension    Moderate mitral regurgitation    moderate to severe MR with moderate pulmonary HTN   Multinodular goiter    Nonischemic cardiomyopathy (HCC)    EF 30-35% by echo 2015   Pulmonary HTN (HCC)    moderate with PASP by echo 2015   PVC's (premature ventricular contractions)     Past Surgical History: Past Surgical History:  Procedure Laterality Date   CARDIAC CATHETERIZATION  2003   normal coronary arteries   CARDIAC CATHETERIZATION  2013  Nov   h/o nonischemic DCM EF 35-40% increase LVEDP    HYSTEROSCOPY WITH D & C  12/02/2012   Procedure: DILATATION AND CURETTAGE /HYSTEROSCOPY;  Surgeon: Dorien Chihuahua. Richardson Dopp, MD;  Location: [facility];  Service: Gynecology;  Laterality: N/A;  cervical polypectomy   LEFT AND RIGHT HEART CATHETERIZATION WITH CORONARY ANGIOGRAM N/A 02/15/2015   Procedure: LEFT AND RIGHT HEART CATHETERIZATION WITH CORONARY ANGIOGRAM;  Surgeon: Corky Crafts, MD;  Location: [facility] CATH LAB;  Service: Cardiovascular;  Laterality: N/A;   RIGHT/LEFT HEART CATH AND CORONARY ANGIOGRAPHY N/A 10/12/2023   Procedure: RIGHT/LEFT HEART CATH AND CORONARY ANGIOGRAPHY;  Surgeon: Laurey Morale, MD;  Location: [facility] INVASIVE CV LAB;  Service: Cardiovascular;  Laterality: N/A;   TEE WITHOUT CARDIOVERSION N/A 10/11/2018   Procedure: TRANSESOPHAGEAL ECHOCARDIOGRAM (TEE);  Surgeon: Laurey Morale, MD;  Location: [facility] ENDOSCOPY;  Service: Cardiovascular;  Laterality: N/A;   TEE WITHOUT CARDIOVERSION N/A 09/07/2023   Procedure: TRANSESOPHAGEAL ECHOCARDIOGRAM;  Surgeon: Laurey Morale, MD;  Location: [facility] INVASIVE  CV LAB;  Service: Cardiovascular;  Laterality: N/A;   TRANSCATHETER MITRAL EDGE TO EDGE REPAIR N/A 12/14/2018   Procedure: MITRAL VALVE REPAIR;  Surgeon: Tonny Bollman, MD;  Location: [facility] INVASIVE CV LAB;  Service: Cardiovascular;  Laterality: N/A;    Family History: Family History  Problem Relation Age of Onset   Breast cancer Mother 33   Heart disease Father    Breast cancer Sister 23   Colon cancer Neg Hx    Esophageal cancer Neg Hx    Rectal cancer Neg Hx    Stomach cancer Neg Hx    Thyroid disease Neg Hx     Social History: Social History   Socioeconomic History   Marital status: Single    Spouse name: Not on file   Number of children: Not on file   Years of education: Not on file   Highest education level: Not on file  Occupational History   Occupation: Retired    Comment: Press photographer, [employer]  Tobacco Use   Smoking  status: Never   Smokeless tobacco: Never  Vaping Use   Vaping status: Never Used  Substance and Sexual Activity   Alcohol use: No    Alcohol/week: 0.0 standard drinks of alcohol   Drug use: No   Sexual activity: Not on file  Other Topics Concern   Not on file  Social History Narrative   Not on file   Social Drivers of Health   Financial Resource Strain: Not on file  Food Insecurity: Not on file  Transportation Needs: Not on file  Physical Activity: Not on file  Stress: Not on file  Social Connections: Not on file    Allergies:  No Known Allergies  Objective:    Vital Signs:   Temp:  [95.2 F (35.1 C)-97.6 F (36.4 C)] 95.7 F (35.4 C) (12/30 1445) Pulse Rate:  [64-98] 98 (12/30 1445) Resp:  [16-25] 22 (12/30 1445) BP: (83-122)/(57-78) 83/57 (12/30 1430) SpO2:  [96 %-98 %] 97 % (12/30 1445) Arterial Line BP: (73-109)/(45-60) 85/54 (12/30 1445) Weight:  [83 kg] 83 kg (12/30 0625)    Weight change: Filed Weights   11/29/23 0625  Weight: 83 kg    Intake/Output:   Intake/Output Summary (Last 24 hours) at  11/29/2023 1453 Last data filed at 11/29/2023 1400 Gross per 24 hour  Intake 4592.04 ml  Output 2585 ml  Net 2007.04 ml    Physical Exam    General:  Intubated/sedated HEENT: ETT in place Neck: supple. Carotids 2+ bilat; no bruits. No lymphadenopathy or thyromegaly appreciated. Cor: PMI nondisplaced. Regular rate & rhythm. No rubs, gallops or murmurs. Chest tubes in place Lungs: clear Abdomen: soft, nontender, nondistended. No hepatosplenomegaly. No bruits or masses. Good bowel sounds. Extremities: no cyanosis, clubbing, rash, edema Neuro: Intubated/sedated  Telemetry   ST 100s with frequent PVCs (personally reviewed)  EKG    No new to review  Labs   Basic Metabolic Panel: Recent Labs  Lab 11/25/23 1452 11/29/23 0812 11/29/23 0843 11/29/23 0915 11/29/23 0942 11/29/23 1010 11/29/23 1044 11/29/23 1132 11/29/23 1135 11/29/23 1253 11/29/23 1407  NA 139 139 139   < > 139   < > 138 137 138 139 138  K 3.8 4.1 3.7   < > 4.0   < > 4.5 4.7 4.7 4.5 4.4  CL 105 106 105  --  104  --  106  --  106  --   --   CO2 23  --   --   --   --   --   --   --   --   --   --   GLUCOSE 87 110* 115*  --  123*  --  149*  --  204*  --   --   BUN 46* 38* 37*  --  37*  --  40*  --  39*  --   --   CREATININE 1.52* 1.60* 1.40*  --  1.40*  --  1.40*  --  1.40*  --   --   CALCIUM 9.1  --   --   --   --   --   --   --   --   --   --    < > = values in this interval not displayed.    Liver Function Tests: Recent Labs  Lab 11/25/23 1452  AST 16  ALT 11  ALKPHOS 36*  BILITOT 2.1*  PROT 7.6  ALBUMIN 3.6   No results for input(s): "LIPASE", "AMYLASE" in the  last 168 hours. No results for input(s): "AMMONIA" in the last 168 hours.  CBC: Recent Labs  Lab 11/25/23 1452 11/29/23 0812 11/29/23 1045 11/29/23 1132 11/29/23 1135 11/29/23 1251 11/29/23 1253 11/29/23 1407  WBC 5.7  --   --   --   --  19.8*  --   --   HGB 11.3*   < > 7.2* 7.5* 8.5* 9.3* 9.9* 8.8*  HCT 36.8   < > 22.3*  22.0* 25.0* 29.1* 29.0* 26.0*  MCV 93.4  --   --   --   --  91.5  --   --   PLT 199  --  91*  --   --  106*  --   --    < > = values in this interval not displayed.    Cardiac Enzymes: No results for input(s): "CKTOTAL", "CKMB", "CKMBINDEX", "TROPONINI" in the last 168 hours.  BNP: BNP (last 3 results) Recent Labs    05/26/23 1610 08/26/23 1103 09/22/23 0905  BNP 778.0* 781.0* 926.0*    ProBNP (last 3 results) No results for input(s): "PROBNP" in the last 8760 hours.   CBG: Recent Labs  Lab 11/29/23 1249 11/29/23 1355  GLUCAP 174* 182*    Coagulation Studies: Recent Labs    11/29/23 1251  LABPROT 20.2*  INR 1.7*     Imaging   DG Chest Port 1 View Result Date: 11/29/2023 CLINICAL DATA:  Status post mitral valve replacement. EXAM: PORTABLE CHEST 1 VIEW COMPARISON:  November 25, 2023. FINDINGS: Endotracheal tube is directed into right mainstem bronchus. Withdrawal by 5-6 cm is recommended. Large left basilar opacity is noted most consistent with atelectasis secondary to endotracheal tube malposition. Small effusion may be present. Right internal jugular Swan-Ganz catheter is noted with tip directed into right pulmonary artery. IMPRESSION: Endotracheal tube is directed into right mainstem bronchus and most likely resulting in significant atelectasis of the left lung. Withdrawal by 5-6 cm is recommended. These results will be called to the ordering clinician or representative by the Radiologist Assistant, and communication documented in the PACS or zVision Dashboard. Electronically Signed   By: Lupita Raider M.D.   On: 11/29/2023 14:20   EP STUDY Result Date: 11/29/2023 See surgical note for result.    Medications:     Current Medications:  [START ON 11/30/2023] acetaminophen  1,000 mg Oral Q6H   Or   [START ON 11/30/2023] acetaminophen (TYLENOL) oral liquid 160 mg/5 mL  1,000 mg Per Tube Q6H   acetaminophen (TYLENOL) oral liquid 160 mg/5 mL  650 mg Per Tube  Once   [START ON 11/30/2023] amiodarone  400 mg Oral BID   aspirin  324 mg Oral Once   [START ON 11/30/2023] aspirin EC  325 mg Oral Daily   Or   [START ON 11/30/2023] aspirin  324 mg Per Tube Daily   [START ON 11/30/2023] bisacodyl  10 mg Oral Daily   Or   [START ON 11/30/2023] bisacodyl  10 mg Rectal Daily   [START ON 11/30/2023] docusate sodium  200 mg Oral Daily   metoCLOPramide (REGLAN) injection  10 mg Intravenous Q6H   metoprolol tartrate  12.5 mg Oral BID   Or   metoprolol tartrate  12.5 mg Per Tube BID   [START ON 12/01/2023] pantoprazole  40 mg Oral Daily   pantoprazole (PROTONIX) IV  40 mg Intravenous QHS   [START ON 11/30/2023] sodium chloride flush  3 mL Intravenous Q12H    Infusions:  sodium chloride 20  mL/hr at 11/29/23 1400   sodium chloride 20 mL/hr at 11/29/23 1400   albumin human 12.5 g (11/29/23 1433)   calcium gluconate      ceFAZolin (ANCEF) IV Stopped (11/29/23 1349)   dexmedetomidine (PRECEDEX) IV infusion 0.7 mcg/kg/hr (11/29/23 1400)   epinephrine 6 mcg/min (11/29/23 1400)   insulin 1.9 Units/hr (11/29/23 1400)   lactated ringers Stopped (11/29/23 1235)   lactated ringers 75 mL/hr at 11/29/23 1400   magnesium sulfate 50 mL/hr at 11/29/23 1400   milrinone 0.4 mcg/kg/min (11/29/23 1400)   nitroGLYCERIN Stopped (11/29/23 1235)   norepinephrine (LEVOPHED) Adult infusion 14 mcg/min (11/29/23 1400)   phenylephrine (NEO-SYNEPHRINE) Adult infusion Stopped (11/29/23 1235)   potassium chloride     propofol (DIPRIVAN) infusion Stopped (11/29/23 1350)   vancomycin     vasopressin 0.03 Units/min (11/29/23 1400)      Patient Profile   Vanessa [name] is a 72 y.o. with history of nonischemic cardiomyopathy and severe MR s/p Mitral Clip x2 (2020), pHTN, and CKD IIIb.  Assessment/Plan   Post Cardiotomy Cardiogenic/Hypovolemic Shock - MVR and tricuspid repair 11/29/23 with Dr. Leafy Ro. Weaned from bypass to inotropic support intra-op. - Received 1u RBCs, 1u  PLT, albumin x1, 1.7L LR, and cell saver 630 in OR. Hgb 9.9. - High sanguineous chest tube output (>1L) with cardiac compromise and increasing pressor requirement: NE 25, Epi 11, Vaso 0.04, Milrinone 0.4 - Will need aggressive volume/blood product repletion - Dr. Leafy Ro to take back for chest re-exploration 2. Chronic systolic CHF: Nonischemic cardiomyopathy.  EF up to 45% on 12/16, repeat echo 11/18 shows that EF remains about 45%.  Etiology of NICM still unclear. Had frequent PVCs by past holter (12%) but doesn't explain her cardiomyopathy. Had improvement of palpitations on Coreg. Cannot rule out myocarditis. May also have component of Takotsubo with passing of child but EF has not improved as completely as would be expected with Takotsubo. Have previously discussed cardiac MRI to look for infiltrative disease but she says that she would be too claustrophobic.  Echo in 11/19 showed EF down to 30-35% and mild to moderate RV systolic function.  There also appeared to be severe mitral regurgitation, which seemed progressive from the past. TEE in 11/19 confirmed severe MR with restricted posterior leaflet => Mitraclip 1/20 with decrease in MR to 1+.  Echo in 1/20 showed EF 30-35% with mild MR s/p Mitraclip. Echo in 9/20 with stable EF 30-35% but dilated IVC suggestive of volume overload.  Echo in 1/22 with EF 40-45%, normal RV.  Echo in 3/23 showed  EF 40-45%, moderate LV dilation, normal RV, normal IVC, s/p MV repair with Mitraclip with mild mitral stenosis mean gradient 7 mmHg, moderate MR. Echo (9/23) showed EF stable 40-45%, mild MS (mean gradient 7 mmHg) and mild to moderate MR s/p Mitraclip.  Echo in 9/24 showed EF 40-45%, mild LV dilation and mild LVH, mildly decreased RV systolic function, PASP 47 mmHg, s/p Mitraclip with mean gradient 6 mmHg, suspect severe mitral regurgitation, IVC normal, small secundum ASD.  Mitral regurgitation has worsened, complicating CHF management (see #4) . Most recent RHC  (11/24) showed no significant CAD, normal RA pressure with mildly elevated PCWP, preserved CO and PAPi, and moderate mixed pulmonary arterial and pulmonary venous hypertension, likely due to mitral valve regurgitation.  - NYHA II. GDMT has been limited by soft BP, CKD, and side effects. - No role for GDMT with current shock. (Prev on Coreg 6.25 bid, Jardiance 10 mg daily, Verquvi 2.5  mg daily) - No Entresto or Spiro with elevated creatinine and history of hyperkalemia. Of note, she did not tolerate Bidil in the past.    3. Mitral regurgitation:  TEE in 11/19 with severe MR, restricted posterior leaflet.  She had Mitraclip x 2 in 1/20. Post-op echo showed mild MR and mild mitral stenosis.  Echo in 1/22 showed moderate MR, mild-moderate MS. Echo in 3/23 showed moderate MR, mild-moderate MS. Echo 9/23 showed mild-moderate MR, mild MS (mean gradient 7 mmHg).  Echo in 9/24 showed MV s/p Mitraclip x 2 with mean gradient 6 mmHg and severe mitral regurgitation. TEE was then done to evaluate, showing 2 Mitraclips in place with mean gradient 4 mmHg and severe eccentric MR from between the 2 clips with ERO 0.54 cm^2.  Dr. Shirlee Latch reviewed the TEE with structural heart colleagues.  She is not thought to be a candidate for an additional Mitraclip due to lack of room between the two existing clips. R/LHC (11/24) showed moderate mixed pulmonary arterial and pulmonary venous hypertension likely due to mitral valve regurgitation.  - s/p MV replacement and Tricuspid repair on 11/29/23 with Dr. Leafy Ro - When stable repeat echo prior to discharge to assess valves 4. PVCs: Frequent (12%) on 10/15 holter.   Pt refused repeat holter monitor for further assessment. PVC count was probably not high enough to cause cardiomyopathy, unlikely primary etiology. - On Amio 400 mg bid - Hold Coreg 5. Pulmonary hypertension: Primarily pulmonary venous HTN (PVR 3.1) from elevated left atrial pressure (previous RHC above).  Most recent RHC  (11/24) with PVR 4.5, with moderate mixed pulmonary arterial and pulmonary venous hypertension likely due to mitral valve regurgitation. Now s/p MVR - No role for diuresis at this time.  6. 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 TSH was normal in 8/24. Follows with Endocrinology 7. CKD stage 3: She is now off Entresto and spironolactone.  - pre-op sCr 1.40  Length of Stay: 0  [place] Lee, NP  11/29/2023, 2:53 PM  Advanced Heart Failure Team Pager [phone] (M-F; 7a - 5p)  Please contact [facility] for night-coverage after hours (4p -7a ) and weekends on [email or web address]  Patient seen with NP, agree with the above note.   Patient had mitral valve replacement with tricuspid valve repair in OR today.  Post-op hypotension, seen in 2H where patient was on epinephrine 5, milrinone 0.4, NE 10, vasopressin 0.03.  She has been having copious bloody chest tube output since return from OR.  MAP 61, CI low at 1.6.    General: Sedated on vent.  Neck: No JVD, no thyromegaly or thyroid nodule.  Lungs: Decreased at bases.  Copious bloody chest tube output.  CV: Nondisplaced PMI.  Heart regular S1/S2, no S3/S4, no murmur.  No peripheral edema.  No carotid bruit.  Unable to palpate pedal pulses.  Abdomen: Soft, nontender, no hepatosplenomegaly, mild distention.  Skin: Intact without lesions or rashes.  Neurologic: Sedated on vent Extremities: No clubbing or cyanosis.  HEENT: Normal.   Patient with post-op hypotension and excessive chest tube output.  She is going to return to OR for re-exploration of chest to look for bleeding source.  Will continue to follow.   Marca Ancona 11/29/2023

## 2023-11-29 NOTE — Transfer of Care (Signed)
Immediate Anesthesia Transfer of Care Note  [name]  Procedure(s) Performed: EXPLORATION POST OPERATIVE OPEN HEART  Patient Location: ICU  Anesthesia Type:General  Level of Consciousness: Patient remains intubated per anesthesia plan  Airway & Oxygen Therapy: Patient remains intubated per anesthesia plan and Patient placed on Ventilator (see vital sign flow sheet for setting)  Post-op Assessment: Report given to RN and Post -op Vital signs reviewed and stable  Post vital signs: Reviewed and stable  Last Vitals:  Vitals Value Taken Time  BP 110/79   Temp    Pulse 79 11/29/23 1747  Resp 20 11/29/23 1747  SpO2 100 % 11/29/23 1747  Vitals shown include unfiled device data.  Last Pain:  Vitals:   11/29/23 1545  TempSrc: Core (Comment)  PainSc:          Complications: No notable events documented.

## 2023-11-29 NOTE — Progress Notes (Signed)
     [address]       [place] [address]             [phone]       Pt has continued to bleed and has hemodynamic compromise. Will need to be explored. Operating room notified.

## 2023-11-29 NOTE — Anesthesia Procedure Notes (Signed)
Central Venous Catheter Insertion Performed by: Mariann Barter, MD, anesthesiologist Start/End12/30/2024 7:00 AM, 11/29/2023 7:15 AM Patient location: Pre-op. Preanesthetic checklist: patient identified, IV checked, site marked, risks and benefits discussed, surgical consent, monitors and equipment checked, pre-op evaluation, timeout performed and anesthesia consent Position: Trendelenburg Lidocaine 1% used for infiltration and patient sedated Hand hygiene performed , maximum sterile barriers used  and Seldinger technique used Catheter size: 8.5 Fr Total catheter length 10. Central line was placed.MAC introducer Swan type:thermodilution PA Cath depth:50 Procedure performed using ultrasound guided technique. Ultrasound Notes:anatomy identified, needle tip was noted to be adjacent to the nerve/plexus identified, no ultrasound evidence of intravascular and/or intraneural injection and image(s) printed for medical record Attempts: 1 Following insertion, line sutured and dressing applied. Post procedure assessment: blood return through all ports, free fluid flow and no air  Patient tolerated the procedure well with no immediate complications.

## 2023-11-29 NOTE — Interval H&P Note (Signed)
History and Physical Interval Note:  11/29/2023 6:32 AM  Vanessa [name]  has presented today for surgery, with the diagnosis of MR TR.  The various methods of treatment have been discussed with the patient and family. After consideration of risks, benefits and other options for treatment, the patient has consented to  Procedure(s): MITRAL VALVE (MV) REPLACEMENT (N/A) TRICUSPID VALVE REPAIR (N/A) TRANSESOPHAGEAL ECHOCARDIOGRAM (N/A) as a surgical intervention.  The patient's history has been reviewed, patient examined, no change in status, stable for surgery.  I have reviewed the patient's chart and labs.  Questions were answered to the patient's satisfaction.     Eugenio Hoes

## 2023-11-30 ENCOUNTER — Encounter (HOSPITAL_COMMUNITY): Payer: Self-pay | Admitting: Thoracic Surgery (Cardiothoracic Vascular Surgery)

## 2023-11-30 ENCOUNTER — Inpatient Hospital Stay (HOSPITAL_COMMUNITY): Payer: PPO

## 2023-11-30 DIAGNOSIS — R578 Other shock: Secondary | ICD-10-CM

## 2023-11-30 DIAGNOSIS — I340 Nonrheumatic mitral (valve) insufficiency: Secondary | ICD-10-CM | POA: Diagnosis not present

## 2023-11-30 DIAGNOSIS — T8111XA Postprocedural  cardiogenic shock, initial encounter: Secondary | ICD-10-CM | POA: Diagnosis not present

## 2023-11-30 DIAGNOSIS — Z952 Presence of prosthetic heart valve: Secondary | ICD-10-CM | POA: Diagnosis not present

## 2023-11-30 DIAGNOSIS — I959 Hypotension, unspecified: Secondary | ICD-10-CM | POA: Diagnosis not present

## 2023-11-30 LAB — CBC
HCT: 29.7 % — ABNORMAL LOW (ref 36.0–46.0)
HCT: 26.7 % — ABNORMAL LOW (ref 36.0–46.0)
Hemoglobin: 10.8 g/dL — ABNORMAL LOW (ref 12.0–15.0)
Hemoglobin: 9.2 g/dL — ABNORMAL LOW (ref 12.0–15.0)
MCH: 30.9 pg (ref 26.0–34.0)
MCH: 30.4 pg (ref 26.0–34.0)
MCHC: 36.4 g/dL — ABNORMAL HIGH (ref 30.0–36.0)
MCHC: 34.5 g/dL (ref 30.0–36.0)
MCV: 85.1 fL (ref 80.0–100.0)
MCV: 88.1 fL (ref 80.0–100.0)
Platelets: 115 10*3/uL — ABNORMAL LOW (ref 150–400)
Platelets: 91 10*3/uL — ABNORMAL LOW (ref 150–400)
RBC: 3.49 MIL/uL — ABNORMAL LOW (ref 3.87–5.11)
RBC: 3.03 MIL/uL — ABNORMAL LOW (ref 3.87–5.11)
RDW: 13.9 % (ref 11.5–15.5)
RDW: 14.6 % (ref 11.5–15.5)
WBC: 10.7 10*3/uL — ABNORMAL HIGH (ref 4.0–10.5)
WBC: 15 10*3/uL — ABNORMAL HIGH (ref 4.0–10.5)
nRBC: 0 % (ref 0.0–0.2)
nRBC: 0 % (ref 0.0–0.2)

## 2023-11-30 LAB — BPAM FFP
Blood Product Expiration Date: 202412302359
Blood Product Expiration Date: 202412302359
Blood Product Expiration Date: 202412302359
Blood Product Expiration Date: 202412302359
Blood Product Expiration Date: 202412302359
Blood Product Expiration Date: 202412302359
Blood Product Expiration Date: 202412302359
ISSUE DATE / TIME: 202412301526
ISSUE DATE / TIME: 202412301526
ISSUE DATE / TIME: 202412301516
ISSUE DATE / TIME: 202412301652
ISSUE DATE / TIME: 202412301652
ISSUE DATE / TIME: 202412301652
ISSUE DATE / TIME: 202412301652
Unit Type and Rh: 6200
Unit Type and Rh: 6200
Unit Type and Rh: 6200
Unit Type and Rh: 6200
Unit Type and Rh: 6200
Unit Type and Rh: 6200
Unit Type and Rh: 6200

## 2023-11-30 LAB — TYPE AND SCREEN
ABO/RH(D): A POS
Antibody Screen: NEGATIVE
Unit division: 0
Unit division: 0
Unit division: 0
Unit division: 0
Unit division: 0
Unit division: 0
Unit division: 0
Unit division: 0
Unit division: 0

## 2023-11-30 LAB — BPAM RBC
Blood Product Expiration Date: 202501162359
Blood Product Expiration Date: 202501162359
Blood Product Expiration Date: 202501162359
Blood Product Expiration Date: 202501162359
Blood Product Expiration Date: 202501162359
Blood Product Expiration Date: 202501162359
Blood Product Expiration Date: 202501162359
Blood Product Expiration Date: 202501162359
Blood Product Expiration Date: 202501172359
ISSUE DATE / TIME: 202412300826
ISSUE DATE / TIME: 202412301526
ISSUE DATE / TIME: 202412301526
ISSUE DATE / TIME: 202412301649
ISSUE DATE / TIME: 202412301649
ISSUE DATE / TIME: 202412301649
ISSUE DATE / TIME: 202412301649
ISSUE DATE / TIME: 202412301848
ISSUE DATE / TIME: 202412302007
Unit Type and Rh: 6200
Unit Type and Rh: 6200
Unit Type and Rh: 6200
Unit Type and Rh: 6200
Unit Type and Rh: 6200
Unit Type and Rh: 6200
Unit Type and Rh: 6200
Unit Type and Rh: 6200
Unit Type and Rh: 6200

## 2023-11-30 LAB — POCT I-STAT 7, (LYTES, BLD GAS, ICA,H+H)
Acid-base deficit: 5 mmol/L — ABNORMAL HIGH (ref 0.0–2.0)
Acid-base deficit: 8 mmol/L — ABNORMAL HIGH (ref 0.0–2.0)
Acid-base deficit: 4 mmol/L — ABNORMAL HIGH (ref 0.0–2.0)
Acid-base deficit: 6 mmol/L — ABNORMAL HIGH (ref 0.0–2.0)
Bicarbonate: 18.4 mmol/L — ABNORMAL LOW (ref 20.0–28.0)
Bicarbonate: 18.5 mmol/L — ABNORMAL LOW (ref 20.0–28.0)
Bicarbonate: 21.8 mmol/L (ref 20.0–28.0)
Bicarbonate: 20.3 mmol/L (ref 20.0–28.0)
Calcium, Ion: 1.08 mmol/L — ABNORMAL LOW (ref 1.15–1.40)
Calcium, Ion: 1.07 mmol/L — ABNORMAL LOW (ref 1.15–1.40)
Calcium, Ion: 1.12 mmol/L — ABNORMAL LOW (ref 1.15–1.40)
Calcium, Ion: 1.07 mmol/L — ABNORMAL LOW (ref 1.15–1.40)
HCT: 29 % — ABNORMAL LOW (ref 36.0–46.0)
HCT: 26 % — ABNORMAL LOW (ref 36.0–46.0)
HCT: 24 % — ABNORMAL LOW (ref 36.0–46.0)
HCT: 26 % — ABNORMAL LOW (ref 36.0–46.0)
Hemoglobin: 9.9 g/dL — ABNORMAL LOW (ref 12.0–15.0)
Hemoglobin: 8.8 g/dL — ABNORMAL LOW (ref 12.0–15.0)
Hemoglobin: 8.2 g/dL — ABNORMAL LOW (ref 12.0–15.0)
Hemoglobin: 8.8 g/dL — ABNORMAL LOW (ref 12.0–15.0)
O2 Saturation: 97 %
O2 Saturation: 89 %
O2 Saturation: 90 %
O2 Saturation: 89 %
Patient temperature: 37.6
Patient temperature: 37.5
Patient temperature: 37.1
Patient temperature: 99
Potassium: 5 mmol/L (ref 3.5–5.1)
Potassium: 4.4 mmol/L (ref 3.5–5.1)
Potassium: 4.3 mmol/L (ref 3.5–5.1)
Potassium: 4.4 mmol/L (ref 3.5–5.1)
Sodium: 137 mmol/L (ref 135–145)
Sodium: 138 mmol/L (ref 135–145)
Sodium: 137 mmol/L (ref 135–145)
Sodium: 137 mmol/L (ref 135–145)
TCO2: 19 mmol/L — ABNORMAL LOW (ref 22–32)
TCO2: 20 mmol/L — ABNORMAL LOW (ref 22–32)
TCO2: 23 mmol/L (ref 22–32)
TCO2: 22 mmol/L (ref 22–32)
pCO2 arterial: 27.8 mm[Hg] — ABNORMAL LOW (ref 32–48)
pCO2 arterial: 40.7 mm[Hg] (ref 32–48)
pCO2 arterial: 44.7 mm[Hg] (ref 32–48)
pCO2 arterial: 41.5 mm[Hg] (ref 32–48)
pH, Arterial: 7.432 (ref 7.35–7.45)
pH, Arterial: 7.267 — ABNORMAL LOW (ref 7.35–7.45)
pH, Arterial: 7.297 — ABNORMAL LOW (ref 7.35–7.45)
pH, Arterial: 7.299 — ABNORMAL LOW (ref 7.35–7.45)
pO2, Arterial: 88 mm[Hg] (ref 83–108)
pO2, Arterial: 65 mm[Hg] — ABNORMAL LOW (ref 83–108)
pO2, Arterial: 66 mm[Hg] — ABNORMAL LOW (ref 83–108)
pO2, Arterial: 62 mm[Hg] — ABNORMAL LOW (ref 83–108)

## 2023-11-30 LAB — BASIC METABOLIC PANEL
Anion gap: 10 (ref 5–15)
Anion gap: 10 (ref 5–15)
Anion gap: 7 (ref 5–15)
BUN: 33 mg/dL — ABNORMAL HIGH (ref 8–23)
BUN: 33 mg/dL — ABNORMAL HIGH (ref 8–23)
BUN: 34 mg/dL — ABNORMAL HIGH (ref 8–23)
CO2: 18 mmol/L — ABNORMAL LOW (ref 22–32)
CO2: 20 mmol/L — ABNORMAL LOW (ref 22–32)
CO2: 22 mmol/L (ref 22–32)
Calcium: 7.5 mg/dL — ABNORMAL LOW (ref 8.9–10.3)
Calcium: 7.2 mg/dL — ABNORMAL LOW (ref 8.9–10.3)
Calcium: 7.2 mg/dL — ABNORMAL LOW (ref 8.9–10.3)
Chloride: 108 mmol/L (ref 98–111)
Chloride: 106 mmol/L (ref 98–111)
Chloride: 106 mmol/L (ref 98–111)
Creatinine, Ser: 1.51 mg/dL — ABNORMAL HIGH (ref 0.44–1.00)
Creatinine, Ser: 1.72 mg/dL — ABNORMAL HIGH (ref 0.44–1.00)
Creatinine, Ser: 1.86 mg/dL — ABNORMAL HIGH (ref 0.44–1.00)
GFR, Estimated: 37 mL/min — ABNORMAL LOW (ref >60)
GFR, Estimated: 31 mL/min — ABNORMAL LOW (ref >60)
GFR, Estimated: 28 mL/min — ABNORMAL LOW (ref >60)
Glucose, Bld: 134 mg/dL — ABNORMAL HIGH (ref 70–99)
Glucose, Bld: 198 mg/dL — ABNORMAL HIGH (ref 70–99)
Glucose, Bld: 162 mg/dL — ABNORMAL HIGH (ref 70–99)
Potassium: 4.8 mmol/L (ref 3.5–5.1)
Potassium: 4.5 mmol/L (ref 3.5–5.1)
Potassium: 4.4 mmol/L (ref 3.5–5.1)
Sodium: 136 mmol/L (ref 135–145)
Sodium: 136 mmol/L (ref 135–145)
Sodium: 135 mmol/L (ref 135–145)

## 2023-11-30 LAB — COOXEMETRY PANEL
Carboxyhemoglobin: 3.3 % — ABNORMAL HIGH (ref 0.5–1.5)
Carboxyhemoglobin: 2.5 % — ABNORMAL HIGH (ref 0.5–1.5)
Carboxyhemoglobin: 0.9 % (ref 0.5–1.5)
Methemoglobin: <0.7 % (ref 0.0–1.5)
Methemoglobin: <0.7 % (ref 0.0–1.5)
Methemoglobin: <0.7 % (ref 0.0–1.5)
O2 Saturation: 67.5 %
O2 Saturation: 58.6 %
O2 Saturation: 57.2 %
Total hemoglobin: 10.2 g/dL — ABNORMAL LOW (ref 12.0–16.0)
Total hemoglobin: 10.7 g/dL — ABNORMAL LOW (ref 12.0–16.0)
Total hemoglobin: 9.3 g/dL — ABNORMAL LOW (ref 12.0–16.0)

## 2023-11-30 LAB — ECHOCARDIOGRAM COMPLETE
AR max vel: 1.39 cm2
AV Area VTI: 1.21 cm2
AV Area mean vel: 1.3 cm2
AV Mean grad: 11.3 mm[Hg]
AV Peak grad: 24 mm[Hg]
Ao pk vel: 2.45 m/s
Area-P 1/2: 3.37 cm2
Calc EF: 39.4 %
Height: 63 in
MV VTI: 1.09 cm2
S' Lateral: 4.8 cm
Single Plane A2C EF: 36.2 %
Single Plane A4C EF: 35.2 %
Weight: 3308.66 [oz_av]

## 2023-11-30 LAB — BPAM CRYOPRECIPITATE
Blood Product Expiration Date: 202501022359
ISSUE DATE / TIME: 202412301806
Unit Type and Rh: 5100

## 2023-11-30 LAB — PREPARE PLATELET PHERESIS
Unit division: 0
Unit division: 0
Unit division: 0
Unit division: 0

## 2023-11-30 LAB — BPAM PLATELET PHERESIS
Blood Product Expiration Date: 202412312359
Blood Product Expiration Date: 202501012359
Blood Product Expiration Date: 202412312359
Blood Product Expiration Date: 202501012359
ISSUE DATE / TIME: 202412301132
ISSUE DATE / TIME: 202412301516
ISSUE DATE / TIME: 202412301659
ISSUE DATE / TIME: 202412302119
Unit Type and Rh: 6200
Unit Type and Rh: 6200
Unit Type and Rh: 5100
Unit Type and Rh: 5100

## 2023-11-30 LAB — PREPARE FRESH FROZEN PLASMA
Unit division: 0
Unit division: 0
Unit division: 0
Unit division: 0

## 2023-11-30 LAB — ECHO INTRAOPERATIVE TEE
AR max vel: 1.35 cm2
AV Area VTI: 1.66 cm2
AV Area mean vel: 1.54 cm2
AV Mean grad: 5.5 mm[Hg]
AV Peak grad: 12.5 mm[Hg]
Ao pk vel: 1.77 m/s
Height: 63 in
MV VTI: 1 cm2
Weight: 2928 [oz_av]

## 2023-11-30 LAB — GLUCOSE, CAPILLARY
Glucose-Capillary: 129 mg/dL — ABNORMAL HIGH (ref 70–99)
Glucose-Capillary: 129 mg/dL — ABNORMAL HIGH (ref 70–99)
Glucose-Capillary: 153 mg/dL — ABNORMAL HIGH (ref 70–99)
Glucose-Capillary: 122 mg/dL — ABNORMAL HIGH (ref 70–99)
Glucose-Capillary: 128 mg/dL — ABNORMAL HIGH (ref 70–99)
Glucose-Capillary: 83 mg/dL (ref 70–99)
Glucose-Capillary: 127 mg/dL — ABNORMAL HIGH (ref 70–99)
Glucose-Capillary: 143 mg/dL — ABNORMAL HIGH (ref 70–99)
Glucose-Capillary: 153 mg/dL — ABNORMAL HIGH (ref 70–99)
Glucose-Capillary: 172 mg/dL — ABNORMAL HIGH (ref 70–99)
Glucose-Capillary: 159 mg/dL — ABNORMAL HIGH (ref 70–99)

## 2023-11-30 LAB — HEPATIC FUNCTION PANEL
ALT: 13 U/L (ref 0–44)
AST: 57 U/L — ABNORMAL HIGH (ref 15–41)
Albumin: 2.8 g/dL — ABNORMAL LOW (ref 3.5–5.0)
Alkaline Phosphatase: 24 U/L — ABNORMAL LOW (ref 38–126)
Bilirubin, Direct: 4 mg/dL — ABNORMAL HIGH (ref 0.0–0.2)
Indirect Bilirubin: 3.2 mg/dL — ABNORMAL HIGH (ref 0.3–0.9)
Total Bilirubin: 7.2 mg/dL — ABNORMAL HIGH (ref 0.0–1.2)
Total Protein: 4.6 g/dL — ABNORMAL LOW (ref 6.5–8.1)

## 2023-11-30 LAB — PREPARE CRYOPRECIPITATE: Unit division: 0

## 2023-11-30 LAB — TRIGLYCERIDES: Triglycerides: 90 mg/dL (ref <150)

## 2023-11-30 LAB — GLOBAL TEG PANEL
CFF Max Amplitude: 16.1 mm (ref 15–32)
CK with Heparinase (R): 5.2 min (ref 4.3–8.3)
Citrated Functional Fibrinogen: 293.8 mg/dL (ref 278–581)
Citrated Kaolin (K): 1.5 min (ref 0.8–2.1)
Citrated Kaolin (MA): 55.5 mm (ref 52–69)
Citrated Kaolin (R): 5.3 min (ref 4.6–9.1)
Citrated Kaolin Angle: 70.7 deg (ref 63–78)
Citrated Rapid TEG (MA): 52.6 mm (ref 52–70)

## 2023-11-30 LAB — MAGNESIUM
Magnesium: 2.9 mg/dL — ABNORMAL HIGH (ref 1.7–2.4)
Magnesium: 2.8 mg/dL — ABNORMAL HIGH (ref 1.7–2.4)
Magnesium: 2.7 mg/dL — ABNORMAL HIGH (ref 1.7–2.4)

## 2023-11-30 LAB — SURGICAL PATHOLOGY

## 2023-11-30 LAB — CG4 I-STAT (LACTIC ACID)
Lactic Acid, Venous: 1.9 mmol/L (ref 0.5–1.9)
Lactic Acid, Venous: 2.2 mmol/L (ref 0.5–1.9)

## 2023-11-30 MED ORDER — PERFLUTREN LIPID MICROSPHERE
1.0000 mL | INTRAVENOUS | Status: DC | PRN
  Administered 2023-11-30: 3 mL via INTRAVENOUS

## 2023-11-30 MED ORDER — INSULIN ASPART 100 UNIT/ML IJ SOLN: 0.0000 [IU] | INTRAMUSCULAR | Status: DC

## 2023-11-30 MED ORDER — MILRINONE LACTATE IN DEXTROSE 20-5 MG/100ML-% IV SOLN
0.3000 ug/kg/min | INTRAVENOUS | Status: AC
  Administered 2023-11-30: 0.3 ug/kg/min via INTRAVENOUS
  Filled 2023-11-30: qty 100

## 2023-11-30 MED ORDER — FENTANYL BOLUS VIA INFUSION
50.0000 ug | INTRAVENOUS | Status: DC | PRN
Start: 2023-11-30 — End: 2023-11-30

## 2023-11-30 MED ORDER — PNEUMOCOCCAL 20-VAL CONJ VACC 0.5 ML IM SUSY: 0.5000 mL | PREFILLED_SYRINGE | INTRAMUSCULAR | Status: DC | PRN

## 2023-11-30 MED ORDER — BUMETANIDE 0.25 MG/ML IJ SOLN
2.0000 mg | Freq: Once | INTRAMUSCULAR | Status: AC
  Administered 2023-11-30: 2 mg via INTRAVENOUS
  Filled 2023-11-30: qty 8

## 2023-11-30 MED ORDER — ENOXAPARIN SODIUM 30 MG/0.3ML IJ SOSY
30.0000 mg | PREFILLED_SYRINGE | Freq: Every day | INTRAMUSCULAR | Status: DC
  Administered 2023-11-30 – 2023-12-02 (×3): 30 mg via SUBCUTANEOUS
  Filled 2023-11-30 (×3): qty 0.3

## 2023-11-30 MED ORDER — FENTANYL CITRATE PF 50 MCG/ML IJ SOSY: 50.0000 ug | PREFILLED_SYRINGE | Freq: Once | INTRAMUSCULAR | Status: DC

## 2023-11-30 MED ORDER — INSULIN ASPART 100 UNIT/ML IJ SOLN
0.0000 [IU] | INTRAMUSCULAR | Status: DC
  Administered 2023-11-30 (×3): 2 [IU] via SUBCUTANEOUS
  Administered 2023-11-30: 4 [IU] via SUBCUTANEOUS
  Administered 2023-12-01 – 2023-12-03 (×9): 2 [IU] via SUBCUTANEOUS
  Administered 2023-12-03: 4 [IU] via SUBCUTANEOUS
  Administered 2023-12-03 – 2023-12-04 (×7): 2 [IU] via SUBCUTANEOUS
  Administered 2023-12-04: 4 [IU] via SUBCUTANEOUS
  Administered 2023-12-05: 2 [IU] via SUBCUTANEOUS
  Administered 2023-12-05: 4 [IU] via SUBCUTANEOUS
  Administered 2023-12-05 – 2023-12-06 (×4): 2 [IU] via SUBCUTANEOUS
  Administered 2023-12-06 (×2): 4 [IU] via SUBCUTANEOUS
  Administered 2023-12-06 – 2023-12-08 (×9): 2 [IU] via SUBCUTANEOUS
  Administered 2023-12-08: 3 [IU] via SUBCUTANEOUS
  Administered 2023-12-08 – 2023-12-17 (×43): 2 [IU] via SUBCUTANEOUS
  Administered 2023-12-17 (×2): 4 [IU] via SUBCUTANEOUS
  Administered 2023-12-18 – 2023-12-19 (×8): 2 [IU] via SUBCUTANEOUS
  Administered 2023-12-19: 4 [IU] via SUBCUTANEOUS
  Administered 2023-12-20 – 2023-12-26 (×26): 2 [IU] via SUBCUTANEOUS
  Administered 2023-12-27: 5 [IU] via SUBCUTANEOUS
  Administered 2023-12-27 – 2024-01-01 (×9): 2 [IU] via SUBCUTANEOUS

## 2023-11-30 MED ORDER — INFLUENZA VAC A&B SURF ANT ADJ 0.5 ML IM SUSY
0.5000 mL | PREFILLED_SYRINGE | INTRAMUSCULAR | Status: DC
  Filled 2023-11-30: qty 0.5

## 2023-11-30 MED ORDER — PERFLUTREN LIPID MICROSPHERE
1.0000 mL | INTRAVENOUS | Status: AC | PRN
  Administered 2023-11-30: 3 mL via INTRAVENOUS

## 2023-11-30 MED ORDER — SODIUM BICARBONATE 8.4 % IV SOLN
100.0000 meq | Freq: Once | INTRAVENOUS | Status: AC
  Administered 2023-11-30: 100 meq via INTRAVENOUS
  Filled 2023-11-30: qty 100

## 2023-11-30 MED ORDER — MILRINONE LACTATE IN DEXTROSE 20-5 MG/100ML-% IV SOLN
0.1250 ug/kg/min | INTRAVENOUS | Status: DC
  Administered 2023-11-30: 0.3 ug/kg/min via INTRAVENOUS
  Administered 2023-12-01: 0.375 ug/kg/min via INTRAVENOUS
  Administered 2023-12-01: 0.3 ug/kg/min via INTRAVENOUS
  Administered 2023-12-02 – 2023-12-03 (×3): 0.375 ug/kg/min via INTRAVENOUS
  Administered 2023-12-03 – 2023-12-04 (×2): 0.25 ug/kg/min via INTRAVENOUS
  Administered 2023-12-05 – 2023-12-06 (×3): 0.125 ug/kg/min via INTRAVENOUS
  Filled 2023-11-30 (×10): qty 100

## 2023-11-30 MED ORDER — FENTANYL BOLUS VIA INFUSION: 30.0000 ug | INTRAVENOUS | Status: DC | PRN

## 2023-11-30 MED ORDER — ORAL CARE MOUTH RINSE: 15.0000 mL | OROMUCOSAL | Status: DC | PRN

## 2023-11-30 NOTE — Discharge Instructions (Signed)
 Discharge Instructions:  1. You may shower, please wash incisions daily with soap and water and keep dry.  If you wish to cover wounds with dressing you may do so but please keep clean and change daily.  No tub baths or swimming until incisions have completely healed.  If your incisions become red or develop any drainage please call our office at [phone]  2. No Driving until cleared by Dr. Karolee Ohs office and you are no longer using narcotic pain medications  3. Monitor your weight daily.. Please use the same scale and weigh at same time... If you gain 5-10 lbs in 48 hours with associated lower extremity swelling, please contact our office at [phone]  4. Fever of 101.5 for at least 24 hours with no source, please contact our office at [phone]  5. Activity- up as tolerated, please walk at least 3 times per day.  Avoid strenuous activity, no lifting, pushing, or pulling with your arms over 8-10 lbs for a minimum of 6 weeks  6. If any questions or concerns arise, please do not hesitate to contact our office at [phone]

## 2023-11-30 NOTE — Progress Notes (Addendum)
 Advanced Heart Failure Rounding Note  Cardiologist: None   Subjective:   Chief Complaint: Cardiogenic Shock  POD#1 s/p MVR (tissue) and TV ring and same day return to OR for tamponade/bleeding. Hgb stable 10.8. Intubated/sedated, SBT ongoing. Co-ox 59 and CO/CI remains low on NE 1, Epi 3, Vaso 0.04. Repeat Echo today  Swan #s: CVP 15 PAP 42/25 PWCP 27 CO/CI 2.5/1.32  Objective:   Weight Range: 93.8 kg Body mass index is 36.63 kg/m.   Vital Signs:   Temp:  [94.8 F (34.9 C)-99.9 F (37.7 C)] 99.5 F (37.5 C) (12/31 0615) Pulse Rate:  [63-105] 67 (12/31 0615) Resp:  [0-26] 26 (12/31 0615) BP: (83-123)/(55-84) 100/63 (12/30 2145) SpO2:  [95 %-100 %] 95 % (12/31 0615) Arterial Line BP: (73-147)/(45-71) 100/55 (12/31 0615) FiO2 (%):  [40 %-50 %] 40 % (12/31 0347) Weight:  [93.8 kg] 93.8 kg (12/31 0500) Last BM Date :  (PTA)  Weight change: Filed Weights   11/29/23 0625 11/30/23 0500  Weight: 83 kg 93.8 kg   Intake/Output:   Intake/Output Summary (Last 24 hours) at 11/30/2023 0714 Last data filed at 11/30/2023 0700 Gross per 24 hour  Intake 12426.05 ml  Output 4455 ml  Net 7971.05 ml    Physical Exam   General:  Intubated. Light sedation RASS. Anxiety with SBT HEENT: ETT in place Neck: supple. Carotids 2+ bilat; no bruits. No lymphadenopathy or thyromegaly appreciated. Cor: PMI nondisplaced. Regular rate & rhythm. No rubs, gallops or murmurs. Chest tubes in place Lungs: clear Abdomen: soft, nontender, nondistended. No hepatosplenomegaly. No bruits or masses. Good bowel sounds. Extremities: no cyanosis, clubbing, rash, 1+ edema Neuro: Intubated. RASS +1. Moves all extremities   Telemetry   SR in 60s (personally reviewed)  EKG    SR with 1AVB at 65 bpm, QRS 122 ms (personally reviewed)  Labs    CBC Recent Labs    11/29/23 2252 11/30/23 0416 11/30/23 0420  WBC 11.9*  --  10.7*  HGB 10.2* 9.9* 10.8*  HCT 29.2* 29.0* 29.7*  MCV 87.2  --   85.1  PLT 110*  --  115*   Basic Metabolic Panel Recent Labs    87/69/75 2252 11/30/23 0416 11/30/23 0420  NA 136 137 136  K 4.7 5.0 4.8  CL 107  --  108  CO2 18*  --  18*  GLUCOSE 157*  --  134*  BUN 32*  --  33*  CREATININE 1.43*  --  1.51*  CALCIUM  7.5*  --  7.5*  MG 3.2*  --  2.9*   Liver Function Tests Recent Labs    11/30/23 0420  AST 57*  ALT 13  ALKPHOS 24*  BILITOT 7.2*  PROT 4.6*  ALBUMIN  2.8*   No results for input(s): LIPASE, AMYLASE in the last 72 hours. Cardiac Enzymes No results for input(s): CKTOTAL, CKMB, CKMBINDEX, TROPONINI in the last 72 hours.  BNP: BNP (last 3 results) Recent Labs    05/26/23 1610 08/26/23 1103 09/22/23 0905  BNP 778.0* 781.0* 926.0*   ProBNP (last 3 results) No results for input(s): PROBNP in the last 8760 hours.  D-Dimer No results for input(s): DDIMER in the last 72 hours. Hemoglobin A1C No results for input(s): HGBA1C in the last 72 hours. Fasting Lipid Panel Recent Labs    11/30/23 0420  TRIG 90   Thyroid  Function Tests No results for input(s): TSH, T4TOTAL, T3FREE, THYROIDAB in the last 72 hours.  Invalid input(s): FREET3  Other results:  Imaging  DG CHEST PORT 1 VIEW Result Date: 11/29/2023 CLINICAL DATA:  Mitral valve repair EXAM: PORTABLE CHEST 1 VIEW COMPARISON:  11/29/2023 FINDINGS: Single frontal view of the chest demonstrates endotracheal tube overlying tracheal air column, tip 4.5 cm above carina. Enteric catheter passes below diaphragm tip projecting over gastric body. Flow directed right internal jugular central venous catheter tip projects over the main pulmonary outflow tract. Mediastinal and pericardial drains again noted. Postsurgical changes are seen from mitral valve repair. Cardiac silhouette remains enlarged. Persistent left basilar consolidation and left pleural effusion, with slight improved aeration in the left lung since prior study. The right chest is  clear. No pneumothorax. IMPRESSION: 1. Persistent left basilar consolidation and left pleural effusion, with slight improved aeration of the left lung since prior study. 2. Support devices as above. Endotracheal tube tip now 4.5 cm above carina. 3. Stable enlargement of the cardiac silhouette. Electronically Signed   By: Ozell Daring M.D.   On: 11/29/2023 19:57   ECHO INTRAOPERATIVE TEE Result Date: 11/29/2023  *INTRAOPERATIVE TRANSESOPHAGEAL REPORT *  Patient Name:   Vanessa [name] Date of Exam: 11/29/2023 Medical Rec #:  [id number]        Height:       63.0 in Accession #:    [id number]       Weight:       183.0 lb Date of Birth:  Jul 09, 1951         BSA:          1.86 m Patient Age:    72 years         BP:           83/57 mmHg Patient Gender: F                HR:           111 bpm. Exam Location:  Anesthesiology Transesophogeal exam was perform intraoperatively during surgical procedure. Patient was closely monitored under general anesthesia during the entirety of examination. Indications:     R00.9* Unspecified abnormalities of heart beat Sonographer:     Ellouise Mose RDCS Performing Phys: [id number] DEWARD KALLMAN Diagnosing Phys: Elsie Needle MD Complications: No known complications during this procedure. POST-OP IMPRESSIONS _ Comments: Tamponade physiology has improved with the evacuation of blood from the pericardial space. All pressors have been weaned from the start of the case. PRE-OP  FINDINGS  Left Ventricle: The left ventricle has mild-moderately reduced systolic function, with an ejection fraction of 40-45%. The cavity size was left ventricular size was not assessed. There is no increase in left ventricular wall thickness. Right Ventricle: The right ventricle has normal systolic function. The cavity was not assessed. There is right vetricular wall thickness was not assessed. Left Atrium: Left atrial size was not assessed. No left atrial/left atrial appendage thrombus was detected. Right Atrium:  Right atrial size was not assessed. Interatrial Septum: The interatrial septum was not assessed. Pericardium: A moderately sized pericardial effusion is present. The pericardial effusion is localized near the right ventricle and localized near the right atrium. There is diastolic collapse of the right atrial wall and diastolic collapse of the right ventricular free wall. There is evidence of cardiac tamponade. Mitral Valve: Replaced. Mitral valve regurgitation is not visualized by color flow Doppler. Tricuspid Valve: The tricuspid valve Repaired. Tricuspid valve regurgitation is trivial by color flow Doppler. Aortic Valve: The aortic valve is normal in structure. Aortic valve regurgitation was not visualized by color flow Doppler. Pulmonic Valve: The pulmonic valve was  normal in structure. Pulmonic valve regurgitation was not assessed by color flow Doppler.  Elsie Needle MD Electronically signed by Elsie Needle MD Signature Date/Time: 11/29/2023/5:53:06 PM    Final    DG Chest Port 1 View Result Date: 11/29/2023 CLINICAL DATA:  Status post mitral valve replacement. EXAM: PORTABLE CHEST 1 VIEW COMPARISON:  November 25, 2023. FINDINGS: Endotracheal tube is directed into right mainstem bronchus. Withdrawal by 5-6 cm is recommended. Large left basilar opacity is noted most consistent with atelectasis secondary to endotracheal tube malposition. Small effusion may be present. Right internal jugular Swan-Ganz catheter is noted with tip directed into right pulmonary artery. IMPRESSION: Endotracheal tube is directed into right mainstem bronchus and most likely resulting in significant atelectasis of the left lung. Withdrawal by 5-6 cm is recommended. These results will be called to the ordering clinician or representative by the Radiologist Assistant, and communication documented in the PACS or zVision Dashboard. Electronically Signed   By: Lynwood Landy Raddle M.D.   On: 11/29/2023 14:20   EP STUDY Result  Date: 11/29/2023 See surgical note for result.   Medications:     Scheduled Medications:  sodium chloride    Intravenous Once   sodium chloride    Intravenous Once   acetaminophen   1,000 mg Oral Q6H   Or   acetaminophen  (TYLENOL ) oral liquid 160 mg/5 mL  1,000 mg Per Tube Q6H   acetaminophen  (TYLENOL ) oral liquid 160 mg/5 mL  650 mg Per Tube Once   acetylcysteine   4 mL Nebulization QID   aspirin   324 mg Oral Once   aspirin  EC  325 mg Oral Daily   Or   aspirin   324 mg Per Tube Daily   bisacodyl   10 mg Oral Daily   Or   bisacodyl   10 mg Rectal Daily   Chlorhexidine  Gluconate Cloth  6 each Topical Daily   Chlorhexidine  Gluconate Cloth  6 each Topical Daily   docusate sodium   200 mg Oral Daily   enoxaparin  (LOVENOX ) injection  30 mg Subcutaneous QHS   insulin  aspart  0-24 Units Subcutaneous Q4H   ipratropium  0.5 mg Nebulization Q6H   mouth rinse  15 mL Mouth Rinse Q2H   [START ON 12/01/2023] pantoprazole   40 mg Oral Daily   pantoprazole  (PROTONIX ) IV  40 mg Intravenous QHS   sodium chloride  flush  3 mL Intravenous Q12H    Infusions:  sodium chloride  Stopped (11/29/23 1520)   sodium chloride  20 mL/hr at 11/30/23 0700   sodium chloride      amiodarone  30 mg/hr (11/30/23 0700)    ceFAZolin  (ANCEF ) IV Stopped (11/30/23 0542)   dexmedetomidine  (PRECEDEX ) IV infusion 0.4 mcg/kg/hr (11/30/23 0700)   epinephrine  3 mcg/min (11/30/23 0700)   fentaNYL  infusion INTRAVENOUS 50 mcg/hr (11/30/23 0700)   insulin  0.9 Units/hr (11/30/23 0700)   lactated ringers  Stopped (11/29/23 1235)   lactated ringers  50 mL/hr at 11/29/23 1500   milrinone      nitroGLYCERIN  Stopped (11/29/23 1235)   norepinephrine  (LEVOPHED ) Adult infusion 1 mcg/min (11/30/23 0700)   phenylephrine  (NEO-SYNEPHRINE) Adult infusion Stopped (11/29/23 1235)   propofol  (DIPRIVAN ) infusion Stopped (11/30/23 0615)   vasopressin  0.04 Units/min (11/30/23 0700)    PRN Medications: sodium chloride , artificial tears, dextrose ,  fentaNYL , metoprolol  tartrate, midazolam , morphine  injection, ondansetron  (ZOFRAN ) IV, mouth rinse, oxyCODONE , sodium chloride  flush, traMADol    Patient Profile   Vanessa [name] is a 72 y.o. with history of nonischemic cardiomyopathy and severe MR s/p Mitral Clip x2 (2020), pHTN, and CKD IIIb.   Assessment/Plan  Post Cardiotomy Cardiogenic/Hypovolemic Shock - MVR and tricuspid ring 11/29/23 with Dr. Maryjane. Weaned from bypass to inotropic support intra-op. - Received 1u RBCs, 1u PLT, albumin  x1, 1.7L LR, and cell saver 630 in OR. Hgb 9.9>6.8. - High sanguineous chest tube output (>1L) with cardiac compromise and increasing pressor requirement: NE 25, Epi 11, Vaso 0.04, Milrinone  0.4. Taken back to OR for tamponade/bleeding for re-exploration. Noted to have large amount of clot within the pericardium and pleural spaces without direct sources of bleeding with diffuse oozing. S/p 8u RBCs, 7 FFP, 3plt, and 1 cryo. Able to wean down pressors, Milrinone  weaned to off in OR, however with persistently low CO/CI overnight would resume Milrinone .  - Intra-op echo yesterday evening with EF 40-45%, nl RV, moderate pericardial effusion with evidence of cardiac tamponade. Repeat echo today - Hgb stable this am 10.8. CT OP 290 overnight. Continue to follow CBC and output. - Co-ox 59 and CI 1.3 overnight. Restart Milrinone .  2. Chronic systolic CHF: Nonischemic cardiomyopathy.  EF up to 45% on 12/16, repeat echo 11/18 shows that EF remains about 45%.  Etiology of NICM still unclear. Had frequent PVCs by past holter (12%) but doesn't explain her cardiomyopathy. Had improvement of palpitations on Coreg . Cannot rule out myocarditis. May also have component of Takotsubo with passing of child but EF has not improved as completely as would be expected with Takotsubo. Have previously discussed cardiac MRI to look for infiltrative disease but she says that she would be too claustrophobic.  Echo in 11/19 showed EF down  to 30-35% and mild to moderate RV systolic function.  There also appeared to be severe mitral regurgitation, which seemed progressive from the past. TEE in 11/19 confirmed severe MR with restricted posterior leaflet => Mitraclip 1/20 with decrease in MR to 1+.  Echo in 1/20 showed EF 30-35% with mild MR s/p Mitraclip. Echo in 9/20 with stable EF 30-35% but dilated IVC suggestive of volume overload.  Echo in 1/22 with EF 40-45%, normal RV.  Echo in 3/23 showed  EF 40-45%, moderate LV dilation, normal RV, normal IVC, s/p MV repair with Mitraclip with mild mitral stenosis mean gradient 7 mmHg, moderate MR. Echo (9/23) showed EF stable 40-45%, mild MS (mean gradient 7 mmHg) and mild to moderate MR s/p Mitraclip.  Echo in 9/24 showed EF 40-45%, mild LV dilation and mild LVH, mildly decreased RV systolic function, PASP 47 mmHg, s/p Mitraclip with mean gradient 6 mmHg, suspect severe mitral regurgitation, IVC normal, small secundum ASD.  Mitral regurgitation has worsened, complicating CHF management (see #4) . Most recent RHC (11/24) showed no significant CAD, normal RA pressure with mildly elevated PCWP, preserved CO and PAPi, and moderate mixed pulmonary arterial and pulmonary venous hypertension, likely due to mitral valve regurgitation.  - NYHA II. GDMT has been limited by soft BP, CKD, and side effects. - No role for GDMT with current shock. (Prev on Coreg  6.25 bid, Jardiance  10 mg daily, Verquvi 2.5 mg daily) - No Entresto  or Spiro with elevated creatinine and history of hyperkalemia. Of note, she did not tolerate Bidil in the past.    3. Mitral regurgitation:  TEE in 11/19 with severe MR, restricted posterior leaflet.  She had Mitraclip x 2 in 1/20. Post-op echo showed mild MR and mild mitral stenosis.  Echo in 1/22 showed moderate MR, mild-moderate MS. Echo in 3/23 showed moderate MR, mild-moderate MS. Echo 9/23 showed mild-moderate MR, mild MS (mean gradient 7 mmHg).  Echo in 9/24 showed MV  s/p Mitraclip x 2  with mean gradient 6 mmHg and severe mitral regurgitation. TEE was then done to evaluate, showing 2 Mitraclips in place with mean gradient 4 mmHg and severe eccentric MR from between the 2 clips with ERO 0.54 cm^2.  Dr. Rolan reviewed the TEE with structural heart colleagues.  She is not thought to be a candidate for an additional Mitraclip due to lack of room between the two existing clips. R/LHC (11/24) showed moderate mixed pulmonary arterial and pulmonary venous hypertension likely due to mitral valve regurgitation.  - s/p MV replacement and Tricuspid repair on 11/29/23 with Dr. Maryjane - intra-op echo yesterday, valves okay. Repeat echo today. 4. PVCs: Frequent (12%) on 10/15 holter.   Pt refused repeat holter monitor for further assessment. PVC count was probably not high enough to cause cardiomyopathy, unlikely primary etiology. - On Amio gtt - Hold Coreg  5. Pulmonary hypertension: Primarily pulmonary venous HTN (PVR 3.1) from elevated left atrial pressure (previous RHC above).  Most recent RHC (11/24) with PVR 4.5, with moderate mixed pulmonary arterial and pulmonary venous hypertension likely due to mitral valve regurgitation. Now s/p MVR - No role for diuresis at this time.  6. Toxic multinodular goiter: She is now off methimazole . TSH was normal in 8/24. Follows with Endocrinology 7. CKD stage 3: She is now off Entresto  and spironolactone .  - pre-op  sCr 1.40>1.51  Length of Stay: 1  Jordan Lee, NP  11/30/2023, 7:14 AM  Advanced Heart Failure Team Pager [phone] (M-F; 7a - 5p)  Please contact [facility] for night-coverage after hours (5p -7a ) and weekends on [email or web address]  Patient seen with NP, agree with the above note.   Creatinine stable at 1.5 this morning, making urine.  Now on epinephrine  5, milrinone  0.3, NE 3, vasopressin  0.04 and amiodarone  gtt.  Milrinone  was restarted this morning with low output, CI up to 2.4 on milrinone .  Co-ox 59% pre-milrinone .  CVP 10 on my  measure with PA 54/22.    I reviewed echo, EF 25-30%, moderate RV dysfunction, s/p bioprosthetic mitral valve replacement with trivial MR and tricuspid valve repair with trivial TR.  No significant pericardial effusion.  She remains in NSR on amiodarone  gtt.   Extubated this morning.   General: NAD Neck: JVP 10 cm, no thyromegaly or thyroid  nodule.  Lungs: Clear to auscultation bilaterally with normal respiratory effort. CV: Nondisplaced PMI.  Heart regular S1/S2, no S3/S4, no murmur.  No peripheral edema.    Abdomen: Soft, nontender, no hepatosplenomegaly, no distention.  Skin: Intact without lesions or rashes.  Neurologic: Alert and oriented x 3.  Psych: Normal affect. Extremities: No clubbing or cyanosis.  HEENT: Normal.   Patient s/p bioprosthetic MVR and TV repair, went back to the OR with post-op tamponade.  Now extubated, hemodynamically stable but on support as above. Echo with EF down to 25-30% and moderate RV dysfunction, expected some fall in EF with correction of severe MR.  - Cardiac output adequate with restarting milrinone  0.3.  Will be slow wean of support.  - Creatinine stable so far and making urine.  Will reassess this afternoon, may give Lasix  but will hold for now.   Stable hemoglobin, no evidence for further bleeding.   CRITICAL CARE Performed by: Ezra Rolan  Total critical care time: 40 minutes  Critical care time was exclusive of separately billable procedures and treating other patients.  Critical care was necessary to treat or prevent imminent or life-threatening deterioration.  Critical care was time spent  personally by me on the following activities: development of treatment plan with patient and/or surrogate as well as nursing, discussions with consultants, evaluation of patient's response to treatment, examination of patient, obtaining history from patient or surrogate, ordering and performing treatments and interventions, ordering and review of  laboratory studies, ordering and review of radiographic studies, pulse oximetry and re-evaluation of patient's condition.  Ezra Shuck 11/30/2023 9:53 AM

## 2023-11-30 NOTE — Progress Notes (Addendum)
 eLink Physician-Brief Progress Note Patient Name: Vanessa [name] DOB: [birth year] MRN: [id number]   Date of Service  11/30/2023  HPI/Events of Note  Shock post cardiac surgery, unable to get off the ventilator postop.  Having intermittent pain.  Numerous PRNs available but fentanyl  drip is running.  eICU Interventions  For simplicity sake, will add fentanyl  bolus from infusion as needed for breakthrough pain.   9551 -asked to check whether additional changes can be made on the ventilator based off of a.m. ABG. pH 7.432 PCO2 27.8, PO2 88.  No changes are currently indicated.  Can likely reduce respiratory rate and allow spontaneous overbreathing as the shock improves.   Intervention Category Intermediate Interventions: Pain - evaluation and management  Emmett Bracknell 11/30/2023, 1:08 AM

## 2023-11-30 NOTE — Progress Notes (Signed)
Per Dr. Katrinka Blazing okay to wean patient on CPAP/PS and not do the rapid wean protocol. RT will continue to monitor and be available as needed.

## 2023-11-30 NOTE — Progress Notes (Signed)
 [address]       [place] [address]             [phone]      1 Day Post-Op  Procedure(s) (LRB): EXPLORATION POST OPERATIVE OPEN HEART (N/A)   Total Length of Stay:  LOS: 1 day    SUBJECTIVE: Bleeding stopped last night Lower CI this am  Vitals:   11/30/23 0600 11/30/23 0615  BP:    Pulse:  67  Resp: (!) 26 (!) 26  Temp: 99.7 F (37.6 C) 99.5 F (37.5 C)  SpO2:  95%    Intake/Output      12/30 0701 12/31 0700 12/31 0701 01/01 0700   I.V. (mL/kg) 4518.2 (48.2)    Blood 4935    IV Piggyback 2910.2    Total Intake(mL/kg) 12363.4 (131.8)    Urine (mL/kg/hr) 1460 (0.6)    Blood 1350    Chest Tube 1610    Total Output 4420    Net +7943.4             sodium chloride  Stopped (11/29/23 1520)   sodium chloride  20 mL/hr at 11/30/23 0600   sodium chloride      amiodarone  30 mg/hr (11/30/23 0600)    ceFAZolin  (ANCEF ) IV Stopped (11/30/23 0542)   dexmedetomidine  (PRECEDEX ) IV infusion 0.7 mcg/kg/hr (11/30/23 0600)   epinephrine  3 mcg/min (11/30/23 0655)   fentaNYL  infusion INTRAVENOUS 75 mcg/hr (11/30/23 0600)   insulin  0.9 Units/hr (11/30/23 0600)   lactated ringers  Stopped (11/29/23 1235)   lactated ringers  50 mL/hr at 11/29/23 1500   nitroGLYCERIN  Stopped (11/29/23 1235)   norepinephrine  (LEVOPHED ) Adult infusion 2 mcg/min (11/30/23 0600)   phenylephrine  (NEO-SYNEPHRINE) Adult infusion Stopped (11/29/23 1235)   propofol  (DIPRIVAN ) infusion 10 mcg/kg/min (11/30/23 0600)   vasopressin  0.04 Units/min (11/30/23 0600)    CBC    Component Value Date/Time   WBC 10.7 (H) 11/30/2023 0420   RBC 3.49 (L) 11/30/2023 0420   HGB 10.8 (L) 11/30/2023 0420   HCT 29.7 (L) 11/30/2023 0420   PLT 115 (L) 11/30/2023 0420   MCV 85.1 11/30/2023 0420   MCH 30.9 11/30/2023 0420   MCHC 36.4 (H) 11/30/2023 0420   RDW 13.9 11/30/2023 0420   LYMPHSABS 1.1 07/24/2022 1528   MONOABS 0.6 07/24/2022 1528   EOSABS 0.0 07/24/2022 1528   BASOSABS 0.0  07/24/2022 1528   CMP     Component Value Date/Time   NA 136 11/30/2023 0420   K 4.8 11/30/2023 0420   CL 108 11/30/2023 0420   CO2 18 (L) 11/30/2023 0420   GLUCOSE 134 (H) 11/30/2023 0420   BUN 33 (H) 11/30/2023 0420   CREATININE 1.51 (H) 11/30/2023 0420   CREATININE 1.30 (H) 04/29/2016 1144   CALCIUM  7.5 (L) 11/30/2023 0420   PROT 4.6 (L) 11/30/2023 0420   ALBUMIN  2.8 (L) 11/30/2023 0420   AST 57 (H) 11/30/2023 0420   ALT 13 11/30/2023 0420   ALKPHOS 24 (L) 11/30/2023 0420   BILITOT 7.2 (H) 11/30/2023 0420   GFRNONAA 37 (L) 11/30/2023 0420   GFRAA 42 (L) 08/19/2020 1304   ABG    Component Value Date/Time   PHART 7.432 11/30/2023 0416   PCO2ART 27.8 (L) 11/30/2023 0416   PO2ART 88 11/30/2023 0416   HCO3 18.4 (L) 11/30/2023 0416   TCO2 19 (L) 11/30/2023 0416   ACIDBASEDEF 5.0 (H) 11/30/2023 0416   O2SAT 58.6 11/30/2023 0420   CBG (last 3)  Recent Labs    11/30/23  9585 11/30/23 0614 11/30/23 0659  GLUCAP 128* 83 127*  EXAM Lungs: clear Card: RR no murmur Ext: Warm Neuro: sedated but follows commands   ASSESSMENT: SP MV replacement (tissue) with TV ring with post op tamponade/coagulopathy Hemodynamics stable however with lower CI this am. ABG with some acidosis and COOX 58. Will increase epi to 5 and restart milrinone  Consider echo this am Wean to extubate Hold on anticoagulation Cr elevated post tamponade yesterday. Follow for now    Deward Kallman, MD 11/30/2023

## 2023-11-30 NOTE — Procedures (Signed)
 Extubation Procedure Note  Patient Details:   Name: Vanessa [name] DOB: July 24, 1951 MRN: [id number]   Airway Documentation:    Vent end date: 11/30/23 Vent end time: 0900   Evaluation  O2 sats: stable throughout Complications: No apparent complications Patient did tolerate procedure well. Bilateral Breath Sounds: Clear, Diminished   Yes   Pt extubated per physician order without complication. Pt suctioned via ETT/orally prior, positive cuff leak heard. NIF -22 and VC . Pt extubated to 3L nasal cannula. Pt able to give a good cough, speak name and no stridor was heard.   Geofm BRAVO Alease Fait 11/30/2023, 9:02 AM

## 2023-11-30 NOTE — Progress Notes (Signed)
 Patient ID: Vanessa [name], female   DOB: Apr 13, 1951, 72 y.o.   MRN: [id number]  TCTS Evening Rounds:  Hemodynamically stable in sinus rhythm on milrinone  0.3, epi 6, NE 3, vaso 0.04.  CI 3.3 this am.  UO ok  CT output low.  BMET    Component Value Date/Time   NA 136 11/30/2023 1447   K 4.5 11/30/2023 1447   CL 106 11/30/2023 1447   CO2 20 (L) 11/30/2023 1447   GLUCOSE 198 (H) 11/30/2023 1447   BUN 33 (H) 11/30/2023 1447   CREATININE 1.72 (H) 11/30/2023 1447   CREATININE 1.30 (H) 04/29/2016 1144   CALCIUM  7.2 (L) 11/30/2023 1447   EGFR 35.0 07/15/2023 1433   GFRNONAA 31 (L) 11/30/2023 1447

## 2023-11-30 NOTE — Progress Notes (Signed)
   NAME:  Vanessa [name], MRN:  [id number], DOB:  Aug 28, 1951, LOS: 1 ADMISSION DATE:  11/29/2023, CONSULTATION DATE:  11/29/23 REFERRING MD:  Dr. Maryjane, CHIEF COMPLAINT:  postop   History of Present Illness:  72yoF with PMH significant for HTN, NICM, HFrEF (EF40-45%), PHTN, CKD and hx of prior severe MR s/p prior mitral clip x 2 in 2020.  Pt with progressive DOE found to have increasing MR  and moderate TR despite adjustments in GDMT.  Not felt to be candidate for additional mitral clip.  Repeat LHC without CAD but showed moderate PHTN.  Underwent mitral valve replacement with tissue valve and tricuspid valve repair by Dr. Maryjane on 12/30.  PCCM consulted to assist with vent and medical management post-operatively in ICU.   Pertinent  Medical History  Never smoker, MR w/ previous Mitral clip x 2 (2020), NICM, HFrEF, PHTN, HTN, CKD3b  Significant Hospital Events: Including procedures, antibiotic start and stop dates in addition to other pertinent events   MVR by Dr. Maryjane; relook for tamponade/washout later in evening without obvious source  Interim History / Subjective:  Awake on vent, improved hemodynamics except CI remains low. Nods head no to pain.  Objective   Blood pressure 100/63, pulse 67, temperature 99.5 F (37.5 C), resp. rate (!) 26, height 5' 3 (1.6 m), weight 93.8 kg, SpO2 95%. PAP: (39-61)/(6-36) 43/26 CVP:  [11 mmHg-20 mmHg] 15 mmHg PCWP:  [26 mmHg] 26 mmHg CO:  [2.2 L/min-3.2 L/min] 2.4 L/min CI:  [1.2 L/min/m2-1.74 L/min/m2] 1.29 L/min/m2  Vent Mode: SIMV;PSV;PRVC FiO2 (%):  [40 %-50 %] 40 % Set Rate:  [16 bmp-26 bmp] 26 bmp Vt Set:  [420 mL] 420 mL PEEP:  [5 cmH20-12 cmH20] 8 cmH20 Pressure Support:  [10 cmH20] 10 cmH20 Plateau Pressure:  [16 cmH20-25 cmH20] 21 cmH20   Intake/Output Summary (Last 24 hours) at 11/30/2023 9275 Last data filed at 11/30/2023 0700 Gross per 24 hour  Intake 12426.05 ml  Output 4455 ml  Net 7971.05 ml   Filed Weights    11/29/23 0625 11/30/23 0500  Weight: 83 kg 93.8 kg   Examination: No distress Tracking Sternotomy site looks okay, blood from mediastinum markedly improved Ext warm +murmur Moves to command  CBC ok BMP looks okay Hyper-bili noted, this is an acute on chronic process Left lung more open  Resolved Hospital Problem list     Assessment & Plan:  Severe  MR( s/p prior mitral clip x 2 in 2020) now s/p tissue MVR Moderate TR s/p TV repair  Moderate PHTN NICM/ HFrEF, EF 40-45% Post bypass vasoplegia and cardioplegia Postop tamponade resolved after relook and washout POD #0 Postoperative vent management Expected post-operative ABLA AKI on CKD3b  P:  - keep drains, plan to start ppx dose lovenox  this evening - inotropes and diuresis per TCTS and AHF discussion - pressors for MAP 65 - SIMV wean on vent, if fails she will need to get fluid off prior to extubation - Will follow while in ICU  Best Practice (right click and Reselect all SmartList Selections daily)   Diet/type: NPO DVT prophylaxis lovenox  this evening Pressure ulcer(s): None GI prophylaxis: PPI Lines: Central line and Arterial Line Foley:  Yes, and it is still needed Code Status:  full code Last date of multidisciplinary goals of care discussion [per primary]  31 min cc time   Rolan Sharps MD PCCM

## 2023-11-30 NOTE — Discharge Summary (Incomplete)
[address]       [place] [address]             [phone]    Physician Discharge Summary  Patient ID: Vanessa [name] MRN: [id number] DOB/AGE: 04-23-1951 72 y.o.  Admit date: 11/29/2023 Discharge date: 12/22/2023  Admission Diagnoses:  Patient Active Problem List   Diagnosis Date Noted   Reactive depression 12/17/2023   Pulmonary hypertension (HCC) 12/17/2023   Shock (HCC) 12/16/2023   Physical deconditioning 12/16/2023   Acute renal failure (HCC) 12/16/2023   S/P mitral valve replacement 11/29/2023   AKI (acute kidney injury) (HCC) 07/24/2022   Hyperkalemia 07/24/2022   Hyperthyroidism 01/10/2022   Pulmonary HTN (HCC)    PVC (premature ventricular contraction) 02/20/2014   Bruit 02/20/2014   Severe mitral insufficiency 02/20/2014   Postmenopausal bleeding 12/02/2012   Endometrial polyp 12/02/2012   Nonischemic dilated cardiomyopathy (HCC) 10/21/2012   OBESITY 01/20/2008   Essential hypertension 01/20/2008   Chronic systolic CHF (congestive heart failure) (HCC) 01/20/2008     Discharge Diagnoses:  Patient Active Problem List   Diagnosis Date Noted   Reactive depression 12/17/2023   Pulmonary hypertension (HCC) 12/17/2023   Shock (HCC) 12/16/2023   Physical deconditioning 12/16/2023   Acute renal failure (HCC) 12/16/2023   S/P mitral valve replacement 11/29/2023   AKI (acute kidney injury) (HCC) 07/24/2022   Hyperkalemia 07/24/2022   Hyperthyroidism 01/10/2022   Pulmonary HTN (HCC)    PVC (premature ventricular contraction) 02/20/2014   Bruit 02/20/2014   Severe mitral insufficiency 02/20/2014   Postmenopausal bleeding 12/02/2012   Endometrial polyp 12/02/2012   Nonischemic dilated cardiomyopathy (HCC) 10/21/2012   OBESITY 01/20/2008   Essential hypertension 01/20/2008   Chronic systolic CHF (congestive heart failure) (HCC) 01/20/2008     Discharged Condition: {condition:18240}  History of Present Illness:     Vanessa [name] is  a very pleasant 72 yo female who has had a long history of Mitral Valve disease. Pt was found to have non ischemic cardiomyopathy and severe MR and underwent Mitral Clip x2 in 2020. Pt over the years has had follow up with increasing MR and EF 40-45%. She has been suffering from exertional DOE mostly with climbing stairs and on inclines. She has lower ext edema when she becomes fluid overloaded and has been on GDMT adjustments due to side effects. Recently she has developed increasing DOE, she underwent TEE to determine if additional Mitral clip placement would be an option and unfortunately felt not to be a candidate. She underwent cath with no CAD but with moderate PHTN. She has not had atrial fibrillation issues. Pt very anxious to proceed as soon as possible to "get this behind her."  Dr. Leafy Ro reviewed the patient's diagnostic studies and determined she would benefit from surgical intervention. He reviewed the treatment options as well as the risks and benefits of surgery. Vanessa [name] was agreeable to proceed with surgery.  Hospital Course: The patient presented to [facility] on 12/30 and was brought to the operating room. She underwent mitral valve replacement utilizing a 31mm Mosaic Bioprosthetic mitral valve and tricuspid valve repair utilizing a 30mm Edwards tricuspid annuloplasty ring. She tolerated the procedure well and was transferred to the SICU in stable condition. Patient returned to the OR for exploration because of bleeding and hemodynamic compromise. Clot was removed and no source of bleeding was found. The patient was returned to the SICU in stable condition. The patient was extubated the following morning without  complication. She was acidotic and required Epinephrine and Milrinone. She had CKD with a baseline creatinine of about 1.4. Temporary pacing was continued for complete heart block. By post-op day 2, she continued to require inotropic and vasopressor support that was  managed by the advanced heart failure team. Echocardiogram 01/01 showed LVEF 40-45% and a small pericardial effusion without clear sign of tamponade although it was a technically difficult study. Creatinine trended up so diuretic was originally withheld but pulmonary pressures were elevated and UO stopped so aggressive diuresis was started. She developed an AKI on CKD, creatinine peaked at 2.76. She developed CHB and was DDD paced but developed atrial fibrillation/aflutter and was transitioned to VVI pacing at 60. She was started on IV Amiodarone.  She will likely require cardioversion when more medically stable.  Her chest tubes were removed on POD3 without complication. She required increasing inotropic support. She developed respiratory failure and was reintubated on 01/02. Echo done showed RV function reduced and small pericardial effusion (no tamponade). She developed renal failure secondary to vasoplegic/septic shock. Nephrology was consulted and started CRRT. She was started on empiric Meropenem for sepsis. The advanced heart failure team was consulted and felt she was not a good candidate for [facility] ECMO.  She was transfused for hemoglobin less than 8 on 01/04.  By postop day #12 she remains vasopressor dependent.  A repeat echocardiogram showed EF 40 to 45% with RV systolic dysfunction with continued ongoing management by the AHF team. She developed leukocytosis and was given a line holiday. Zosyn and Linezolid were restarted, white count continued to rise and zostn was transitioned to Meropenem. Per nephrology she was given a trial of Lasix with intermittent HD.  This was not felt to be successful and CRRT was resumed. RHC on 1/17 showed low filling pressures, mild pulmonary artery hypertension, and low cardiac output. Milrinone was restarted resulting in improved CO-Ox parameters.  Midodrine was added but she continued to require support with the norepinephrine.  She had poor oral intake so she was supported  with nocturnal tube feeding for protein calorie malnutrition.  IV antibiotics were continued for persistent leukocytosis despite any other evidence of clinical infection.  WBC count is slowly decreasing over time.  On 12/22/2023 IR was consulted with plans to convert temporary catheter to a tunneled catheter.   Consults: cardiology, pulmonary/intensive care, nephrology, and IR (Advanced Heart Failure)  Significant Diagnostic Studies: ECHOCARDIOGRAM LIMITED REPORT       Patient Name:   Vanessa [name] Date of Exam: 11/30/2023  Medical Rec #:  [id number]        Height:       63.0 in  Accession #:    [id number]       Weight:       206.8 lb  Date of Birth:  July 25, 1951         BSA:          1.961 m  Patient Age:    72 years         BP:           113/84 mmHg  Patient Gender: F                HR:           66 bpm.  Exam Location:  Inpatient   Procedure: 2D Echo, Cardiac Doppler, Color Doppler and Intracardiac             Opacification Agent   Indications:  Shock R57.9    History:        Patient has prior history of Echocardiogram examinations,  most                 recent 12/15/2018. CHF and Cardiomyopathy, Pulmonary HTN;  Risk                 Factors:Hypertension.    Sonographer:    Webb Laws  Referring Phys: Lorin Glass   IMPRESSIONS     1. Left ventricular ejection fraction, by estimation, is 25 to 30%. The  left ventricle has severely decreased function. The left ventricular  internal cavity size was mildly dilated. There is mild concentric left  ventricular hypertrophy.   2. Right ventricular systolic function is moderately reduced.   3. Left atrial size was mildly dilated.   4. There is a well seated 31mm bioprosthetic valve in the mitral  position. There is trivial mitral valve regurgitation. There is no mitral  stenosis or pathologic paravalvular regurgitation.   5. The tricuspid valve is has been repaired/replaced. The tricuspid valve  is status post repair  with an annuloplasty ring. Tricuspid valve  regurgitation is mild.   6. The aortic valve is normal in structure. Aortic valve regurgitation is  trivial.   FINDINGS   Left Ventricle: Left ventricular ejection fraction, by estimation, is 25  to 30%. The left ventricle has severely decreased function. Definity  contrast agent was given IV to delineate the left ventricular endocardial  borders. The left ventricular  internal cavity size was mildly dilated. There is mild concentric left  ventricular hypertrophy.   Right Ventricle: Right ventricular systolic function is moderately  reduced.   Left Atrium: Left atrial size was mildly dilated.   Right Atrium: Right atrial size was normal in size.   Pericardium: There is no evidence of pericardial effusion.   Mitral Valve: The mitral valve has been repaired/replaced. Trivial mitral  valve regurgitation. There is a 31 mm bioprosthetic valve present in the  mitral position. MV peak gradient, 10.9 mmHg. The mean mitral valve  gradient is 4.0 mmHg.   Tricuspid Valve: The tricuspid valve is has been repaired/replaced.  Tricuspid valve regurgitation is mild. The tricuspid valve is status post  repair with an annuloplasty ring.   Aortic Valve: The aortic valve is normal in structure. Aortic valve  regurgitation is trivial. Aortic valve mean gradient measures 11.3 mmHg.  Aortic valve peak gradient measures 24.0 mmHg. Aortic valve area, by VTI  measures 1.21 cm.   Aorta: The aortic root is normal in size and structure.   Venous: The inferior vena cava was not well visualized.   LEFT VENTRICLE  PLAX 2D  LVIDd:         4.90 cm      Diastology  LVIDs:         4.80 cm      LV e' medial:    4.03 cm/s  LV PW:         0.90 cm      LV E/e' medial:  42.4  LV IVS:        1.30 cm      LV e' lateral:   6.85 cm/s  LVOT diam:     1.60 cm      LV E/e' lateral: 25.0  LV SV:         52  LV SV Index:   27  LVOT Area:     2.01 cm  3D Volume EF:  LV Volumes (MOD)            3D EF:        36 %  LV vol d, MOD A2C: 133.0 ml LV EDV:       163 ml  LV vol d, MOD A4C: 135.0 ml LV ESV:       105 ml  LV vol s, MOD A2C: 84.8 ml  LV SV:        58 ml  LV vol s, MOD A4C: 87.5 ml  LV SV MOD A2C:     48.2 ml  LV SV MOD A4C:     135.0 ml  LV SV MOD BP:      56.0 ml   RIGHT VENTRICLE  RV Basal diam:  3.30 cm  RV S prime:     7.83 cm/s  TAPSE (M-mode): 1.5 cm   LEFT ATRIUM             Index        RIGHT ATRIUM          Index  LA Vol (A2C):   51.0 ml 26.00 ml/m  RA Area:     8.62 cm  LA Vol (A4C):   57.1 ml 29.11 ml/m  RA Volume:   15.10 ml 7.70 ml/m  LA Biplane Vol: 56.4 ml 28.76 ml/m   AORTIC VALVE  AV Area (Vmax):    1.39 cm  AV Area (Vmean):   1.30 cm  AV Area (VTI):     1.21 cm  AV Vmax:           245.00 cm/s  AV Vmean:          153.667 cm/s  AV VTI:            0.434 m  AV Peak Grad:      24.0 mmHg  AV Mean Grad:      11.3 mmHg  LVOT Vmax:         169.00 cm/s  LVOT Vmean:        99.600 cm/s  LVOT VTI:          0.261 m  LVOT/AV VTI ratio: 0.60    AORTA  Ao Root diam: 2.40 cm  Ao Asc diam:  3.20 cm   MITRAL VALVE                TRICUSPID VALVE  MV Area (PHT): 3.37 cm     TR Peak grad:   25.4 mmHg  MV Area VTI:   1.09 cm     TR Vmax:        252.00 cm/s  MV Peak grad:  10.9 mmHg  MV Mean grad:  4.0 mmHg     SHUNTS  MV Vmax:       1.65 m/s     Systemic VTI:  0.26 m  MV Vmean:      96.4 cm/s    Systemic Diam: 1.60 cm  MV Decel Time: 225 msec  MV E velocity: 171.00 cm/s  MV A velocity: 93.10 cm/s  MV E/A ratio:  1.84   Aditya Sabharwal  Electronically signed by Dorthula Nettles  Signature Date/Time: 11/30/2023/9:17:55 AM        Final     RIGHT/LEFT HEART CATH AND CORONARY ANGIOGRAPHY   1. Normal RA pressure, mildly elevated PCWP.  2. Moderate mixed pulmonary arterial and pulmonary venous hypertension likely due to mitral valve regurgitation.  3. Preserved cardiac output and PAPi.  4.  No significant coronary disease noted.     Treatments: surgery: 11/29/2023 Vanessa [name] [id number]   Surgeon:  Ashley Akin, MD   First Assistant: Aloha Gell [facility]                               An experienced assistant was required given the complexity of this surgery and the standard of surgical care. The assistant was needed for exposure, dissection, suctioning, retraction of delicate tissues and sutures, instrument exchange and for overall help during this procedure.       Preoperative Diagnosis:  Severe Mitral Regurgitation sp mitral clip x 2                                             Moderate Tricuspid Regurgitation   Postoperative Diagnosis:  Same     Procedure: Mitral Valve Replacement with a Mosaic Porcine 31 mm Tissue prosthesis Tricuspid Valve repair with a 30 mm MC3 Annuloplasty ring   11/29/2023 Vanessa [name] [id number]   Surgeon:  Ashley Akin, MD   First Assistant: Doree Fudge, Gershon Crane [facility]                               An experienced assistant was required given the complexity of this surgery and the standard of surgical care. The assistant was needed for exposure, dissection, suctioning, retraction of delicate tissues and sutures, instrument exchange and for overall help during this procedure.       Preoperative Diagnosis:  Tamponade sp MV/TV surgery   Postoperative Diagnosis:  Same     Procedure: Re exploration for bleeding  Discharge Exam: Blood pressure (!) 88/53, pulse 74, temperature 97.8 F (36.6 C), temperature source Axillary, resp. rate 16, height 5\' 3"  (1.6 m), weight 79.2 kg, SpO2 97%. {physical [id number]   Discharge Medications:  The patient has been discharged on:   1.Beta Blocker:  Yes [   ]                              No   [   ]                              If No, reason:  2.Ace Inhibitor/ARB: Yes [   ]                                     No  [    ]                                     If No,  reason:  3.Statin:   Yes [   ]                  No  [   ]                  If No, reason:  4.Ecasa:  Yes  [  X ]  No   [   ]                  If No, reason:  Patient had ACS upon admission: No  Plavix/P2Y12 inhibitor: Yes [   ]                                      No  [   ]      Allergies as of 12/22/2023   No Known Allergies   Med Rec must be completed prior to using this [facility]***       Follow-up Information     [facility].   Specialty: Cardiothoracic Surgery Contact information: [address] [place], [address] [place] [address] [phone]        [facility] .   Contact information: [address] [place] [address]                Signed:  Rowe Clack, Cordelia Poche  12/22/2023, 2:29 PM

## 2023-11-30 NOTE — TOC Initial Note (Signed)
 Transition of Care [facility]) - Initial/Assessment Note    Patient Details  Name: Vanessa [name] MRN: [id number] Date of Birth: 08/05/1951  [facility] CM/SW Contact:    Arlana JINNY Nicholaus [place] Phone Number: [phone] 11/30/2023, 11:56 AM  Clinical Narrative:   11:56 AM- HF CSW attempted  to meet with pt at bedside. Pt had family visiting. Pt requested that the CSW come back at a later time. CSW will follow up at a more appropriate time.   TOC will continue following.                     Patient Goals and CMS Choice            Expected Discharge Plan and Services                                              Prior Living Arrangements/Services                       Activities of Daily Living      Permission Sought/Granted                  Emotional Assessment              Admission diagnosis:  S/P mitral valve replacement [Z95.2] Patient Active Problem List   Diagnosis Date Noted   S/P mitral valve replacement 11/29/2023   AKI (acute kidney injury) (HCC) 07/24/2022   Hyperkalemia 07/24/2022   Hyperthyroidism 01/10/2022   Pulmonary HTN (HCC)    PVC (premature ventricular contraction) 02/20/2014   Bruit 02/20/2014   Severe mitral insufficiency 02/20/2014   Postmenopausal bleeding 12/02/2012   Endometrial polyp 12/02/2012   Nonischemic dilated cardiomyopathy (HCC) 10/21/2012   OBESITY 01/20/2008   Essential hypertension 01/20/2008   Chronic systolic CHF (congestive heart failure) (HCC) 01/20/2008   PCP:  Alvera Reagin, PA Pharmacy:   [facility] [place] - [address] [place] ([place]) [place] [address] Phone: [phone] Fax: [phone]     Social Drivers of Health (SDOH) Social History: SDOH Screenings   Tobacco Use: Low Risk  (11/29/2023)   SDOH Interventions:     Readmission Risk Interventions     No data to display

## 2023-12-01 ENCOUNTER — Inpatient Hospital Stay (HOSPITAL_COMMUNITY): Payer: PPO

## 2023-12-01 DIAGNOSIS — I340 Nonrheumatic mitral (valve) insufficiency: Secondary | ICD-10-CM | POA: Diagnosis not present

## 2023-12-01 DIAGNOSIS — T8111XA Postprocedural  cardiogenic shock, initial encounter: Secondary | ICD-10-CM | POA: Diagnosis not present

## 2023-12-01 DIAGNOSIS — I959 Hypotension, unspecified: Secondary | ICD-10-CM | POA: Diagnosis not present

## 2023-12-01 DIAGNOSIS — Z952 Presence of prosthetic heart valve: Secondary | ICD-10-CM | POA: Diagnosis not present

## 2023-12-01 DIAGNOSIS — I3139 Other pericardial effusion (noninflammatory): Secondary | ICD-10-CM | POA: Diagnosis not present

## 2023-12-01 LAB — GLUCOSE, CAPILLARY
Glucose-Capillary: 115 mg/dL — ABNORMAL HIGH (ref 70–99)
Glucose-Capillary: 121 mg/dL — ABNORMAL HIGH (ref 70–99)
Glucose-Capillary: 126 mg/dL — ABNORMAL HIGH (ref 70–99)
Glucose-Capillary: 133 mg/dL — ABNORMAL HIGH (ref 70–99)
Glucose-Capillary: 149 mg/dL — ABNORMAL HIGH (ref 70–99)
Glucose-Capillary: 157 mg/dL — ABNORMAL HIGH (ref 70–99)
Glucose-Capillary: 160 mg/dL — ABNORMAL HIGH (ref 70–99)

## 2023-12-01 LAB — BASIC METABOLIC PANEL
Anion gap: 8 (ref 5–15)
Anion gap: 10 (ref 5–15)
BUN: 34 mg/dL — ABNORMAL HIGH (ref 8–23)
BUN: 41 mg/dL — ABNORMAL HIGH (ref 8–23)
CO2: 21 mmol/L — ABNORMAL LOW (ref 22–32)
CO2: 21 mmol/L — ABNORMAL LOW (ref 22–32)
Calcium: 7.1 mg/dL — ABNORMAL LOW (ref 8.9–10.3)
Calcium: 6.9 mg/dL — ABNORMAL LOW (ref 8.9–10.3)
Chloride: 106 mmol/L (ref 98–111)
Chloride: 101 mmol/L (ref 98–111)
Creatinine, Ser: 1.93 mg/dL — ABNORMAL HIGH (ref 0.44–1.00)
Creatinine, Ser: 2.38 mg/dL — ABNORMAL HIGH (ref 0.44–1.00)
GFR, Estimated: 27 mL/min — ABNORMAL LOW (ref >60)
GFR, Estimated: 21 mL/min — ABNORMAL LOW (ref >60)
Glucose, Bld: 153 mg/dL — ABNORMAL HIGH (ref 70–99)
Glucose, Bld: 146 mg/dL — ABNORMAL HIGH (ref 70–99)
Potassium: 4.3 mmol/L (ref 3.5–5.1)
Potassium: 4.5 mmol/L (ref 3.5–5.1)
Sodium: 135 mmol/L (ref 135–145)
Sodium: 132 mmol/L — ABNORMAL LOW (ref 135–145)

## 2023-12-01 LAB — COOXEMETRY PANEL
Carboxyhemoglobin: 1 % (ref 0.5–1.5)
Methemoglobin: <0.7 % (ref 0.0–1.5)
O2 Saturation: 65.3 %
Total hemoglobin: 8.8 g/dL — ABNORMAL LOW (ref 12.0–16.0)

## 2023-12-01 LAB — LACTIC ACID, PLASMA: Lactic Acid, Venous: 1.3 mmol/L (ref 0.5–1.9)

## 2023-12-01 LAB — ECHOCARDIOGRAM LIMITED
Height: 63 in
Weight: 3604.96 [oz_av]

## 2023-12-01 LAB — CBC
HCT: 25.5 % — ABNORMAL LOW (ref 36.0–46.0)
Hemoglobin: 8.8 g/dL — ABNORMAL LOW (ref 12.0–15.0)
MCH: 30.6 pg (ref 26.0–34.0)
MCHC: 34.5 g/dL (ref 30.0–36.0)
MCV: 88.5 fL (ref 80.0–100.0)
Platelets: 78 10*3/uL — ABNORMAL LOW (ref 150–400)
RBC: 2.88 MIL/uL — ABNORMAL LOW (ref 3.87–5.11)
RDW: 14.9 % (ref 11.5–15.5)
WBC: 16.6 10*3/uL — ABNORMAL HIGH (ref 4.0–10.5)
nRBC: 0 % (ref 0.0–0.2)

## 2023-12-01 LAB — MAGNESIUM: Magnesium: 2.9 mg/dL — ABNORMAL HIGH (ref 1.7–2.4)

## 2023-12-01 MED ORDER — HYDROMORPHONE HCL 1 MG/ML IJ SOLN: 0.5000 mg | INTRAMUSCULAR | Status: DC | PRN

## 2023-12-01 MED ORDER — LEVALBUTEROL HCL 0.63 MG/3ML IN NEBU
0.6300 mg | INHALATION_SOLUTION | Freq: Four times a day (QID) | RESPIRATORY_TRACT | Status: DC | PRN
  Administered 2023-12-01 – 2023-12-02 (×2): 0.63 mg via RESPIRATORY_TRACT
  Filled 2023-12-01 (×2): qty 3

## 2023-12-01 MED ORDER — FUROSEMIDE 10 MG/ML IJ SOLN
80.0000 mg | Freq: Two times a day (BID) | INTRAMUSCULAR | Status: AC
Start: 2023-12-01 — End: 2023-12-01
  Administered 2023-12-01 (×2): 80 mg via INTRAVENOUS
  Filled 2023-12-01 (×2): qty 8

## 2023-12-01 MED ORDER — HYDROMORPHONE HCL 1 MG/ML IJ SOLN
0.5000 mg | INTRAMUSCULAR | Status: DC | PRN
  Administered 2023-12-10 – 2023-12-21 (×2): 0.5 mg via INTRAVENOUS
  Filled 2023-12-01 (×2): qty 0.5

## 2023-12-01 NOTE — Progress Notes (Addendum)
 NAME:  Vanessa [name], MRN:  [id number], DOB:  Sep 06, 1951, LOS: 2 ADMISSION DATE:  11/29/2023, CONSULTATION DATE:  11/29/23 REFERRING MD:  Dr. Maryjane, CHIEF COMPLAINT:  postop   History of Present Illness:  72yoF with PMH significant for HTN, NICM, HFrEF (EF40-45%), PHTN, CKD and hx of prior severe MR s/p prior mitral clip x 2 in 2020.  Pt with progressive DOE found to have increasing MR  and moderate TR despite adjustments in GDMT.  Not felt to be candidate for additional mitral clip.  Repeat LHC without CAD but showed moderate PHTN.  Underwent mitral valve replacement with tissue valve and tricuspid valve repair by Dr. Maryjane on 12/30.  PCCM consulted to assist with vent and medical management post-operatively in ICU.   Pertinent  Medical History  Never smoker, MR w/ previous Mitral clip x 2 (2020), NICM, HFrEF, PHTN, HTN, CKD3b  Significant Hospital Events: Including procedures, antibiotic start and stop dates in addition to other pertinent events   MVR by Dr. Maryjane; relook for tamponade/washout later in evening without obvious source  Interim History / Subjective:  No events.  Some issues with breathing: has trouble breathing through nose since extubation Still on fair amount of pressors + milrinone .  Objective   Blood pressure 100/63, pulse 99, temperature 98.6 F (37 C), resp. rate (!) 33, height 5' 3 (1.6 m), weight 102.2 kg, SpO2 93%. PAP: (46-83)/(19-39) 75/30 CVP:  [9 mmHg-26 mmHg] 14 mmHg CO:  [3.3 L/min-6.2 L/min] 6.2 L/min CI:  [1.8 L/min/m2-3.31 L/min/m2] 3.3 L/min/m2  FiO2 (%):  [40 %] 40 %   Intake/Output Summary (Last 24 hours) at 12/01/2023 0725 Last data filed at 12/01/2023 0700 Gross per 24 hour  Intake 2602.62 ml  Output 1220 ml  Net 1382.62 ml   Filed Weights   11/29/23 0625 11/30/23 0500 12/01/23 0500  Weight: 83 kg 93.8 kg 102.2 kg   Examination: No distress Mildly anxious Sternotomy site dressed Minimal chest tube output Moves to command L  chest: minimal effusion + atelectasis   amiodarone  30 mg/hr (12/01/23 0700)   dexmedetomidine  (PRECEDEX ) IV infusion Stopped (11/30/23 0701)   epinephrine  7 mcg/min (12/01/23 0700)   milrinone  0.3 mcg/kg/min (12/01/23 0700)   nitroGLYCERIN  Stopped (11/29/23 1235)   norepinephrine  (LEVOPHED ) Adult infusion 13 mcg/min (12/01/23 0700)   phenylephrine  (NEO-SYNEPHRINE) Adult infusion Stopped (11/29/23 1235)   vasopressin  0.04 Units/min (12/01/23 0700)    WBC up Plts down Cr drifting up [place] 65 CXR edema and L effusion  Resolved Hospital Problem list   Postoperative vent management  Assessment & Plan:  Severe  MR( s/p prior mitral clip x 2 in 2020) now s/p tissue MVR Moderate TR s/p TV repair  Moderate PHTN NICM/ HFrEF, EF 40-45% Post bypass vasoplegia and cardioplegia Postop tamponade resolved after relook and washout POD #0 Postop hypoxemia combination atelectasis and volume L effusion too small for intervention 1/1 US  Expected post-operative ABLA AKI on CKD3b now query cardiorenal  P:  - drain management per TCTS - inotrope and diuretic management per AHF - vaso, levo for MAP 65 - consider relook limited echo - braced coughing and incentive spirometry - hopefully things settle out hemodynamically over next day or two  Best Practice (right click and Reselect all SmartList Selections daily)   Diet/type:regular DVT prophylaxis lovenox  this evening Pressure ulcer(s): None GI prophylaxis: PPI Lines: Central line and Arterial Line Foley:  Yes, and it is still needed Code Status:  full code Last date of multidisciplinary goals of care  discussion [per primary]  32 min cc time  Rolan Sharps MD PCCM

## 2023-12-01 NOTE — Progress Notes (Signed)
 Echocardiogram 2D Echocardiogram has been performed.  Warren Lacy Billie Trager RDCS 12/01/2023, 2:28 PM

## 2023-12-01 NOTE — Progress Notes (Signed)
 2 Days Post-Op Procedure(s) (LRB): EXPLORATION POST OPERATIVE OPEN HEART (N/A) Subjective:  No complaints. Pain under good control  Objective: Vital signs in last 24 hours: Temp:  [98.6 F (37 C)-99.9 F (37.7 C)] 98.8 F (37.1 C) (01/01 0900) Pulse Rate:  [60-99] 94 (01/01 0900) Cardiac Rhythm: Ventricular paced (01/01 0400) Resp:  [13-33] 27 (01/01 0900) SpO2:  [89 %-97 %] 91 % (01/01 0900) Arterial Line BP: (88-137)/(41-75) 118/48 (01/01 0900) Weight:  [102.2 kg] 102.2 kg (01/01 0500)  Hemodynamic parameters for last 24 hours: PAP: (46-83)/(19-41) 74/25 CVP:  [0 mmHg-32 mmHg] 14 mmHg CO:  [5.3 L/min-6.2 L/min] 5.6 L/min CI:  [2.8 L/min/m2-3.31 L/min/m2] 3.03 L/min/m2  Intake/Output from previous day: 12/31 0701 - 01/01 0700 In: 2602.6 [P.O.:420; I.V.:1882.4; IV Piggyback:300.2] Out: 1220 [Urine:760; Chest Tube:460] Intake/Output this shift: Total I/O In: 212.1 [I.V.:212.1] Out: 65 [Urine:45; Chest Tube:20]  General appearance: alert and cooperative Neurologic: intact Heart: regular rate and rhythm, S1, S2 normal, no murmur Lungs: diminished breath sounds bibasilar Abdomen: soft, non-tender; bowel sounds normal Extremities: edema moderate Wound: dressing dry  Lab Results: Recent Labs    11/30/23 1657 11/30/23 1742 12/01/23 0359  WBC 15.0*  --  16.6*  HGB 9.2* 8.8* 8.8*  HCT 26.7* 26.0* 25.5*  PLT 91*  --  78*   BMET:  Recent Labs    11/30/23 2105 12/01/23 0359  NA 135 135  K 4.4 4.3  CL 106 106  CO2 22 21*  GLUCOSE 162* 153*  BUN 34* 34*  CREATININE 1.86* 1.93*  CALCIUM  7.2* 7.1*    PT/INR:  Recent Labs    11/29/23 1801  LABPROT 19.4*  INR 1.6*   ABG    Component Value Date/Time   PHART 7.299 (L) 11/30/2023 1742   HCO3 20.3 11/30/2023 1742   TCO2 22 11/30/2023 1742   ACIDBASEDEF 6.0 (H) 11/30/2023 1742   O2SAT 65.3 12/01/2023 0418   CBG (last 3)  Recent Labs    12/01/23 0030 12/01/23 0443 12/01/23 0758  GLUCAP 160* 157* 115*    CXR: LLL atelectasis, probably some left pleural effusion.   Assessment/Plan:  POD 2 MVR and TV repair with postop exploration for bleeding.  Hemodynamics stable on epi 7, milrinone  0.3, NE 13, vaso 0.04. CI 3 and Co-ox 65%. Severe pulmonary HTN with PA pressures 2/3 systemic. Wean vasopressors as tolerated. AHF team following. EF 25-30% in OR.  Rhythm under pacer looks like CHB with accel junctional 70's. Continue DDD pacing at 90. May need to consider stopping amio. No beta blocker.  Creat up to 1.93 this am. Baseline 1.4-1.6 preop. Holding off on diuresis while on high dose vasopressor.   Keep chest tubes today and likely remove tomorrow.  IS.   LOS: 2 days    Vanessa [name] 12/01/2023

## 2023-12-01 NOTE — Progress Notes (Signed)
 Patient ID: Vanessa [name], female   DOB: June 26, 1951, 73 y.o.   MRN: [id number]  TCTS Evening Rounds:  Hemodynamics marginal with BP 90/52 by arterial line, 121/61 by cuff. PA catheter pulled back today but I floated it into PA after multiple tries and PA 75/27. On milrinone  0.375 and epi 7, NE 10, vaso 0.04.  Rhythm under pacer is atrial fib/flutter 73, so I put pacer on VVI 60. Continues on IV amio.  Increased oxygen requirement today. Now on 15L NRB with sats 97%.  Repeat CXR this afternoon rotated but essentially unchanged with moderate left pleural effusion and LLL atelectasis, some pulmonary edema. Not doing much on IS. May need bipap if she worsens. CCM following.   Received 80 mg IV lasix  earlier today but no significant diuresis.   She is awake and alert.

## 2023-12-01 NOTE — Progress Notes (Addendum)
 Patient ID: Vanessa [name], female   DOB: 04-Sep-1951, 73 y.o.   MRN: [id number]     Advanced Heart Failure Rounding Note  Cardiologist: None   Subjective:   Chief Complaint: Cardiogenic Shock  POD#2 s/p MVR (tissue) and TV ring and same day return to OR for tamponade/bleeding.  She is on epinephrine  7, milrinone  0.3, NE 13, vasopressin  0.04.  Creatinine 1.86 => 1.93  Short NSVT runs, now on amiodarone  30 mg/hr.   Swan #s: CVP 14 PAP 74/25 CI 3.03 Co-ox 65%  Nauseated this morning, does not feel good.   Objective:   Weight Range: 102.2 kg Body mass index is 39.91 kg/m.   Vital Signs:   Temp:  [98.6 F (37 C)-99.9 F (37.7 C)] 98.8 F (37.1 C) (01/01 0900) Pulse Rate:  [75-99] 94 (01/01 0900) Resp:  [13-33] 27 (01/01 0900) SpO2:  [89 %-96 %] 91 % (01/01 0900) Arterial Line BP: (93-137)/(41-75) 118/48 (01/01 0900) Weight:  [102.2 kg] 102.2 kg (01/01 0500) Last BM Date :  (PTA)  Weight change: Filed Weights   11/29/23 0625 11/30/23 0500 12/01/23 0500  Weight: 83 kg 93.8 kg 102.2 kg   Intake/Output:   Intake/Output Summary (Last 24 hours) at 12/01/2023 1111 Last data filed at 12/01/2023 1100 Gross per 24 hour  Intake 2852.09 ml  Output 1150 ml  Net 1702.09 ml    Physical Exam   General: NAD Neck: JVP 14 cm, no thyromegaly or thyroid  nodule.  Lungs: Decreased at bases.  CV: Nondisplaced PMI.  Heart regular S1/S2, no S3/S4, no murmur.  Trace ankle edema.  Abdomen: Soft, nontender, no hepatosplenomegaly, no distention.  Skin: Intact without lesions or rashes.  Neurologic: Alert and oriented x 3.  Psych: Normal affect. Extremities: No clubbing or cyanosis.  HEENT: Normal.   Telemetry   NSR with v-pacing (personally reviewed)  Labs    CBC Recent Labs    11/30/23 1657 11/30/23 1742 12/01/23 0359  WBC 15.0*  --  16.6*  HGB 9.2* 8.8* 8.8*  HCT 26.7* 26.0* 25.5*  MCV 88.1  --  88.5  PLT 91*  --  78*   Basic Metabolic Panel Recent Labs     11/30/23 2105 12/01/23 0359  NA 135 135  K 4.4 4.3  CL 106 106  CO2 22 21*  GLUCOSE 162* 153*  BUN 34* 34*  CREATININE 1.86* 1.93*  CALCIUM  7.2* 7.1*  MG 2.7* 2.9*   Liver Function Tests Recent Labs    11/30/23 0420  AST 57*  ALT 13  ALKPHOS 24*  BILITOT 7.2*  PROT 4.6*  ALBUMIN  2.8*   No results for input(s): LIPASE, AMYLASE in the last 72 hours. Cardiac Enzymes No results for input(s): CKTOTAL, CKMB, CKMBINDEX, TROPONINI in the last 72 hours.  BNP: BNP (last 3 results) Recent Labs    05/26/23 1610 08/26/23 1103 09/22/23 0905  BNP 778.0* 781.0* 926.0*   ProBNP (last 3 results) No results for input(s): PROBNP in the last 8760 hours.  D-Dimer No results for input(s): DDIMER in the last 72 hours. Hemoglobin A1C No results for input(s): HGBA1C in the last 72 hours. Fasting Lipid Panel Recent Labs    11/30/23 0420  TRIG 90   Thyroid  Function Tests No results for input(s): TSH, T4TOTAL, T3FREE, THYROIDAB in the last 72 hours.  Invalid input(s): FREET3  Other results:  Imaging   DG Chest Port 1 View Result Date: 12/01/2023 CLINICAL DATA:  Status post mitral valve repair. EXAM: PORTABLE CHEST 1 VIEW COMPARISON:  11/30/2023 FINDINGS: Status post median sternotomy and mitral valve repair. Unchanged Swan-Ganz catheter with tip in the right ventricular outflow tract. Mediastinal drains are in place, no pneumothorax visualized. Stable cardiac enlargement. Persistent moderate left pleural effusion and small right pleural effusion. Pulmonary vascular congestion. No pneumothorax. IMPRESSION: 1. Status post median sternotomy and mitral valve repair. 2. Persistent moderate left pleural effusion and small right pleural effusion with pulmonary vascular congestion. Electronically Signed   By: Waddell Calk M.D.   On: 12/01/2023 10:02    Medications:     Scheduled Medications:  acetaminophen   1,000 mg Oral Q6H   Or   acetaminophen  (TYLENOL )  oral liquid 160 mg/5 mL  1,000 mg Per Tube Q6H   acetaminophen  (TYLENOL ) oral liquid 160 mg/5 mL  650 mg Per Tube Once   aspirin   324 mg Oral Once   aspirin  EC  325 mg Oral Daily   Or   aspirin   324 mg Per Tube Daily   bisacodyl   10 mg Oral Daily   Or   bisacodyl   10 mg Rectal Daily   Chlorhexidine  Gluconate Cloth  6 each Topical Daily   docusate sodium   200 mg Oral Daily   enoxaparin  (LOVENOX ) injection  30 mg Subcutaneous QHS   influenza vaccine adjuvanted  0.5 mL Intramuscular Tomorrow-1000   insulin  aspart  0-24 Units Subcutaneous Q4H   pantoprazole   40 mg Oral Daily   sodium chloride  flush  3 mL Intravenous Q12H    Infusions:  amiodarone  30 mg/hr (12/01/23 1100)   epinephrine  7 mcg/min (12/01/23 1100)   milrinone  0.3 mcg/kg/min (12/01/23 1100)   norepinephrine  (LEVOPHED ) Adult infusion 13 mcg/min (12/01/23 1100)   phenylephrine  (NEO-SYNEPHRINE) Adult infusion Stopped (11/29/23 1235)   vasopressin  0.04 Units/min (12/01/23 1100)    PRN Medications: artificial tears, HYDROmorphone  (DILAUDID ) injection, ondansetron  (ZOFRAN ) IV, mouth rinse, oxyCODONE , pneumococcal 20-valent conjugate vaccine, sodium chloride  flush, traMADol    Patient Profile   Vanessa [name] is a 73 y.o. with history of nonischemic cardiomyopathy and severe MR s/p Mitral Clip x2 (2020), pHTN, and CKD IIIb.   Assessment/Plan   1. Post Cardiotomy Cardiogenic/vasoplegic Shock: Bioprosthetic MVR and tricuspid ring 11/29/23 with Dr. Maryjane. Pre-op  cath with no significant CAD.  Pre-op  echo with EF 40-45%, severe MR.  Weaned from bypass to inotropic support intra-op. High sanguineous chest tube output (>1L) with cardiac compromise and increasing pressor requirement.  Went back to the OR with post-op tamponade for clean-out.  Echo 12/31 showed EF 25-30%, moderate RV dysfunction, s/p bioprosthetic mitral valve replacement with trivial MR and tricuspid valve repair with trivial TR.  No significant pericardial  effusion. Expected some fall in EF with correction of severe MR.  Today, she is on NE 13, Epi 7, Vaso 0.04, Milrinone  0.3. CI 3 from [place] with co-ox 65%.  CVP 14-15 with volume overload on exam and tachypnea.   Creatinine fairly stable 1.86 => 1.93.  - With elevated PA pressure, will increase milrinone  to 0.375.  - Will work on slowly weaning down NE and epinephrine .  - Lasix  80 mg IV bid today, follow response.  She looks uncomfortable/dyspneic, think we are going to need to diurese.  - Repeat echo today to reassess pericardial space to make sure not re-accumulating fluid.  2. Mitral regurgitation:  TEE in 11/19 with severe MR, restricted posterior leaflet.  She had Mitraclip x 2 in 1/20. Post-op echo showed mild MR and mild mitral stenosis.  Echo in 1/22 showed moderate MR, mild-moderate MS. Echo in 3/23  showed moderate MR, mild-moderate MS. Echo 9/23 showed mild-moderate MR, mild MS (mean gradient 7 mmHg).  Echo in 9/24 showed MV s/p Mitraclip x 2 with mean gradient 6 mmHg and severe mitral regurgitation. TEE was then done to evaluate, showing 2 Mitraclips in place with mean gradient 4 mmHg and severe eccentric MR from between the 2 clips with ERO 0.54 cm^2.  She was not thought to be a candidate for an additional Mitraclip due to lack of room between the two existing clips. R/LHC (11/24) showed moderate mixed pulmonary arterial and pulmonary venous hypertension likely due to mitral valve regurgitation. S/p MV replacement and Tricuspid repair on 11/29/23 with Dr. Maryjane - Valves stable on post-op echo.  3. PVCs: Frequent (12%) on 10/15 holter.   Pt refused repeat holter monitor for further assessment. PVC count was probably not high enough to cause cardiomyopathy, unlikely primary etiology. Has had short NSVT.  - Continue Amio gtt 4. Pulmonary hypertension: Most recent RHC (11/24) with PVR 4.5, with moderate mixed pulmonary arterial and pulmonary venous hypertension likely due to mitral valve  regurgitation. Now s/p MVR, PA pressure 70s/20s today.  - Diuresis today.  - Increase milrinone  to 0.375. 5. Toxic multinodular goiter: She is now off methimazole . TSH was normal in 8/24. Follows with Endocrinology 6. AKI on CKD stage 3: Pre-op  creatinine 1.5, up to 1.9 today. Follow closely.  7. Anemia: Post-op blood loss.  Hgb 8.8 today, transfuse < 8.  8. Complete heart block: She has underlying CHB (new post-op).  Currently has pacing wires.  If conduction system does not improve, will need PPM (CRT-capable device).   CRITICAL CARE Performed by: Ezra Shuck  Total critical care time: 40 minutes  Critical care time was exclusive of separately billable procedures and treating other patients.  Critical care was necessary to treat or prevent imminent or life-threatening deterioration.  Critical care was time spent personally by me on the following activities: development of treatment plan with patient and/or surrogate as well as nursing, discussions with consultants, evaluation of patient's response to treatment, examination of patient, obtaining history from patient or surrogate, ordering and performing treatments and interventions, ordering and review of laboratory studies, ordering and review of radiographic studies, pulse oximetry and re-evaluation of patient's condition.  Ezra Shuck 12/01/2023 11:11 AM

## 2023-12-02 ENCOUNTER — Inpatient Hospital Stay (HOSPITAL_COMMUNITY): Payer: PPO

## 2023-12-02 DIAGNOSIS — N179 Acute kidney failure, unspecified: Secondary | ICD-10-CM

## 2023-12-02 DIAGNOSIS — I272 Pulmonary hypertension, unspecified: Secondary | ICD-10-CM

## 2023-12-02 DIAGNOSIS — I959 Hypotension, unspecified: Secondary | ICD-10-CM | POA: Diagnosis not present

## 2023-12-02 DIAGNOSIS — T8111XA Postprocedural  cardiogenic shock, initial encounter: Secondary | ICD-10-CM | POA: Diagnosis not present

## 2023-12-02 DIAGNOSIS — I340 Nonrheumatic mitral (valve) insufficiency: Secondary | ICD-10-CM | POA: Diagnosis not present

## 2023-12-02 DIAGNOSIS — R570 Cardiogenic shock: Secondary | ICD-10-CM | POA: Diagnosis not present

## 2023-12-02 DIAGNOSIS — Z952 Presence of prosthetic heart valve: Secondary | ICD-10-CM | POA: Diagnosis not present

## 2023-12-02 LAB — POCT I-STAT 7, (LYTES, BLD GAS, ICA,H+H)
Acid-base deficit: 7 mmol/L — ABNORMAL HIGH (ref 0.0–2.0)
Acid-base deficit: 5 mmol/L — ABNORMAL HIGH (ref 0.0–2.0)
Bicarbonate: 21.6 mmol/L (ref 20.0–28.0)
Bicarbonate: 23 mmol/L (ref 20.0–28.0)
Calcium, Ion: 1.01 mmol/L — ABNORMAL LOW (ref 1.15–1.40)
Calcium, Ion: 1 mmol/L — ABNORMAL LOW (ref 1.15–1.40)
HCT: 26 % — ABNORMAL LOW (ref 36.0–46.0)
HCT: 27 % — ABNORMAL LOW (ref 36.0–46.0)
Hemoglobin: 8.8 g/dL — ABNORMAL LOW (ref 12.0–15.0)
Hemoglobin: 9.2 g/dL — ABNORMAL LOW (ref 12.0–15.0)
O2 Saturation: 79 %
O2 Saturation: 97 %
Patient temperature: 36.8
Patient temperature: 37
Potassium: 4.7 mmol/L (ref 3.5–5.1)
Potassium: 4.5 mmol/L (ref 3.5–5.1)
Sodium: 133 mmol/L — ABNORMAL LOW (ref 135–145)
Sodium: 133 mmol/L — ABNORMAL LOW (ref 135–145)
TCO2: 23 mmol/L (ref 22–32)
TCO2: 25 mmol/L (ref 22–32)
pCO2 arterial: 62.1 mm[Hg] — ABNORMAL HIGH (ref 32–48)
pCO2 arterial: 57.4 mm[Hg] — ABNORMAL HIGH (ref 32–48)
pH, Arterial: 7.148 — CL (ref 7.35–7.45)
pH, Arterial: 7.211 — ABNORMAL LOW (ref 7.35–7.45)
pO2, Arterial: 56 mm[Hg] — ABNORMAL LOW (ref 83–108)
pO2, Arterial: 115 mm[Hg] — ABNORMAL HIGH (ref 83–108)

## 2023-12-02 LAB — RENAL FUNCTION PANEL
Albumin: 2.6 g/dL — ABNORMAL LOW (ref 3.5–5.0)
Anion gap: 12 (ref 5–15)
BUN: 46 mg/dL — ABNORMAL HIGH (ref 8–23)
CO2: 19 mmol/L — ABNORMAL LOW (ref 22–32)
Calcium: 7.2 mg/dL — ABNORMAL LOW (ref 8.9–10.3)
Chloride: 102 mmol/L (ref 98–111)
Creatinine, Ser: 2.76 mg/dL — ABNORMAL HIGH (ref 0.44–1.00)
GFR, Estimated: 18 mL/min — ABNORMAL LOW (ref >60)
Glucose, Bld: 132 mg/dL — ABNORMAL HIGH (ref 70–99)
Phosphorus: 6.6 mg/dL — ABNORMAL HIGH (ref 2.5–4.6)
Potassium: 4.7 mmol/L (ref 3.5–5.1)
Sodium: 133 mmol/L — ABNORMAL LOW (ref 135–145)

## 2023-12-02 LAB — GLUCOSE, CAPILLARY
Glucose-Capillary: 112 mg/dL — ABNORMAL HIGH (ref 70–99)
Glucose-Capillary: 109 mg/dL — ABNORMAL HIGH (ref 70–99)
Glucose-Capillary: 82 mg/dL (ref 70–99)
Glucose-Capillary: 115 mg/dL — ABNORMAL HIGH (ref 70–99)
Glucose-Capillary: 134 mg/dL — ABNORMAL HIGH (ref 70–99)
Glucose-Capillary: 144 mg/dL — ABNORMAL HIGH (ref 70–99)

## 2023-12-02 LAB — BASIC METABOLIC PANEL
Anion gap: 10 (ref 5–15)
Anion gap: 13 (ref 5–15)
BUN: 40 mg/dL — ABNORMAL HIGH (ref 8–23)
BUN: 43 mg/dL — ABNORMAL HIGH (ref 8–23)
CO2: 22 mmol/L (ref 22–32)
CO2: 21 mmol/L — ABNORMAL LOW (ref 22–32)
Calcium: 7 mg/dL — ABNORMAL LOW (ref 8.9–10.3)
Calcium: 7.1 mg/dL — ABNORMAL LOW (ref 8.9–10.3)
Chloride: 101 mmol/L (ref 98–111)
Chloride: 97 mmol/L — ABNORMAL LOW (ref 98–111)
Creatinine, Ser: 2.54 mg/dL — ABNORMAL HIGH (ref 0.44–1.00)
Creatinine, Ser: 2.72 mg/dL — ABNORMAL HIGH (ref 0.44–1.00)
GFR, Estimated: 20 mL/min — ABNORMAL LOW (ref >60)
GFR, Estimated: 18 mL/min — ABNORMAL LOW (ref >60)
Glucose, Bld: 128 mg/dL — ABNORMAL HIGH (ref 70–99)
Glucose, Bld: 137 mg/dL — ABNORMAL HIGH (ref 70–99)
Potassium: 4.6 mmol/L (ref 3.5–5.1)
Potassium: 4.6 mmol/L (ref 3.5–5.1)
Sodium: 133 mmol/L — ABNORMAL LOW (ref 135–145)
Sodium: 131 mmol/L — ABNORMAL LOW (ref 135–145)

## 2023-12-02 LAB — CBC
HCT: 25.5 % — ABNORMAL LOW (ref 36.0–46.0)
Hemoglobin: 8.5 g/dL — ABNORMAL LOW (ref 12.0–15.0)
MCH: 30.5 pg (ref 26.0–34.0)
MCHC: 33.3 g/dL (ref 30.0–36.0)
MCV: 91.4 fL (ref 80.0–100.0)
Platelets: 65 10*3/uL — ABNORMAL LOW (ref 150–400)
RBC: 2.79 MIL/uL — ABNORMAL LOW (ref 3.87–5.11)
RDW: 15.2 % (ref 11.5–15.5)
WBC: 18.8 10*3/uL — ABNORMAL HIGH (ref 4.0–10.5)
nRBC: 0.1 % (ref 0.0–0.2)

## 2023-12-02 LAB — MAGNESIUM: Magnesium: 2.8 mg/dL — ABNORMAL HIGH (ref 1.7–2.4)

## 2023-12-02 LAB — COOXEMETRY PANEL
Carboxyhemoglobin: 1.9 % — ABNORMAL HIGH (ref 0.5–1.5)
Methemoglobin: <0.7 % (ref 0.0–1.5)
O2 Saturation: 60.5 %
Total hemoglobin: 8.7 g/dL — ABNORMAL LOW (ref 12.0–16.0)

## 2023-12-02 LAB — CG4 I-STAT (LACTIC ACID): Lactic Acid, Venous: 1 mmol/L (ref 0.5–1.9)

## 2023-12-02 MED ORDER — METHYLENE BLUE (ANTIDOTE) 1 % IV SOLN
200.0000 mg | Freq: Once | Status: AC
  Administered 2023-12-02: 200 mg via INTRAVENOUS
  Filled 2023-12-02: qty 20

## 2023-12-02 MED ORDER — ALBUMIN HUMAN 25 % IV SOLN
25.0000 g | Freq: Four times a day (QID) | INTRAVENOUS | Status: AC
Start: 2023-12-02 — End: 2023-12-03
  Administered 2023-12-02 – 2023-12-03 (×2): 25 g via INTRAVENOUS
  Administered 2023-12-03: 12.5 g via INTRAVENOUS
  Filled 2023-12-02 (×4): qty 100

## 2023-12-02 MED ORDER — SODIUM BICARBONATE 8.4 % IV SOLN: 100.0000 meq | Freq: Once | INTRAVENOUS | Status: AC

## 2023-12-02 MED ORDER — SODIUM BICARBONATE 8.4 % IV SOLN
INTRAVENOUS | Status: AC
  Administered 2023-12-02: 50 meq via INTRAVENOUS
  Filled 2023-12-02: qty 50

## 2023-12-02 MED ORDER — FENTANYL BOLUS VIA INFUSION
50.0000 ug | INTRAVENOUS | Status: DC | PRN
  Administered 2023-12-02: 100 ug via INTRAVENOUS

## 2023-12-02 MED ORDER — HEPARIN SODIUM (PORCINE) 1000 UNIT/ML DIALYSIS
1000.0000 [IU] | INTRAMUSCULAR | Status: DC | PRN
  Administered 2023-12-03 – 2023-12-09 (×2): 2800 [IU] via INTRAVENOUS_CENTRAL
  Filled 2023-12-02: qty 4
  Filled 2023-12-02 (×2): qty 3
  Filled 2023-12-02: qty 6
  Filled 2023-12-02: qty 2
  Filled 2023-12-02: qty 6

## 2023-12-02 MED ORDER — NOREPINEPHRINE BITARTRATE 1 MG/ML IV SOLN: 0.0000 ug/min | INTRAVENOUS | Status: DC

## 2023-12-02 MED ORDER — ORAL CARE MOUTH RINSE
15.0000 mL | OROMUCOSAL | Status: DC
  Administered 2023-12-02 – 2023-12-05 (×37): 15 mL via OROMUCOSAL

## 2023-12-02 MED ORDER — PRISMASOL BGK 4/2.5 32-4-2.5 MEQ/L REPLACEMENT SOLN: Status: DC

## 2023-12-02 MED ORDER — AMIODARONE LOAD VIA INFUSION: 150.0000 mg | Freq: Once | INTRAVENOUS | Status: DC

## 2023-12-02 MED ORDER — FUROSEMIDE 10 MG/ML IJ SOLN
120.0000 mg | Freq: Once | INTRAVENOUS | Status: AC
  Administered 2023-12-02: 120 mg via INTRAVENOUS
  Filled 2023-12-02: qty 10

## 2023-12-02 MED ORDER — MIDAZOLAM HCL 2 MG/2ML IJ SOLN
INTRAMUSCULAR | Status: AC
  Administered 2023-12-02: 2 mg
  Filled 2023-12-02: qty 2

## 2023-12-02 MED ORDER — PRISMASOL BGK 4/2.5 32-4-2.5 MEQ/L EC SOLN: Status: DC

## 2023-12-02 MED ORDER — FENTANYL BOLUS VIA INFUSION
50.0000 ug | INTRAVENOUS | Status: DC | PRN
  Administered 2023-12-02 – 2023-12-03 (×3): 100 ug via INTRAVENOUS
  Administered 2023-12-04 (×3): 50 ug via INTRAVENOUS

## 2023-12-02 MED ORDER — METOLAZONE 5 MG PO TABS
5.0000 mg | ORAL_TABLET | Freq: Once | ORAL | Status: AC
  Administered 2023-12-02: 5 mg via ORAL
  Filled 2023-12-02: qty 1

## 2023-12-02 MED ORDER — NOREPINEPHRINE BITARTRATE 1 MG/ML IV SOLN
0.0000 ug/min | INTRAVENOUS | Status: DC
  Filled 2023-12-02 (×2): qty 16

## 2023-12-02 MED ORDER — DOCUSATE SODIUM 50 MG/5ML PO LIQD
100.0000 mg | Freq: Two times a day (BID) | ORAL | Status: DC
  Administered 2023-12-02 – 2023-12-04 (×4): 100 mg
  Filled 2023-12-02 (×4): qty 10

## 2023-12-02 MED ORDER — MIDAZOLAM HCL 2 MG/2ML IJ SOLN
1.0000 mg | INTRAMUSCULAR | Status: DC | PRN
  Administered 2023-12-02: 2 mg via INTRAVENOUS
  Filled 2023-12-02: qty 2

## 2023-12-02 MED ORDER — POLYETHYLENE GLYCOL 3350 17 G PO PACK
17.0000 g | PACK | Freq: Every day | ORAL | Status: DC
  Filled 2023-12-02: qty 1

## 2023-12-02 MED ORDER — FENTANYL CITRATE PF 50 MCG/ML IJ SOSY
50.0000 ug | PREFILLED_SYRINGE | Freq: Once | INTRAMUSCULAR | Status: AC
  Filled 2023-12-02: qty 1

## 2023-12-02 MED ORDER — FUROSEMIDE 10 MG/ML IJ SOLN
15.0000 mg/h | INTRAVENOUS | Status: DC
  Administered 2023-12-02: 15 mg/h via INTRAVENOUS
  Filled 2023-12-02 (×2): qty 20

## 2023-12-02 MED ORDER — ETOMIDATE 2 MG/ML IV SOLN
INTRAVENOUS | Status: AC
  Administered 2023-12-02: 10 mg
  Filled 2023-12-02: qty 20

## 2023-12-02 MED ORDER — SODIUM CHLORIDE 0.9 % IV SOLN: INTRAVENOUS | Status: AC | PRN

## 2023-12-02 MED ORDER — FENTANYL CITRATE PF 50 MCG/ML IJ SOSY
PREFILLED_SYRINGE | INTRAMUSCULAR | Status: AC
  Administered 2023-12-02: 100 ug via INTRAVENOUS
  Filled 2023-12-02: qty 2

## 2023-12-02 MED ORDER — BUMETANIDE 0.25 MG/ML IJ SOLN
2.0000 mg | Freq: Once | INTRAMUSCULAR | Status: DC
  Filled 2023-12-02: qty 8

## 2023-12-02 MED ORDER — FENTANYL 2500MCG IN NS 250ML (10MCG/ML) PREMIX INFUSION
50.0000 ug/h | INTRAVENOUS | Status: DC
  Administered 2023-12-02: 100 ug/h via INTRAVENOUS
  Administered 2023-12-03 – 2023-12-05 (×4): 150 ug/h via INTRAVENOUS
  Filled 2023-12-02 (×5): qty 250

## 2023-12-02 MED ORDER — ROCURONIUM BROMIDE 10 MG/ML (PF) SYRINGE
PREFILLED_SYRINGE | INTRAVENOUS | Status: AC
  Filled 2023-12-02: qty 10

## 2023-12-02 MED ORDER — SODIUM CHLORIDE 0.9 % FOR CRRT
INTRAVENOUS_CENTRAL | Status: DC | PRN
  Administered 2023-12-09: 2000 mL via INTRAVENOUS_CENTRAL

## 2023-12-02 MED ORDER — FUROSEMIDE 10 MG/ML IJ SOLN: 80.0000 mg | Freq: Once | INTRAMUSCULAR | Status: DC

## 2023-12-02 MED ORDER — NOREPINEPHRINE 16 MG/250ML-% IV SOLN
0.0000 ug/min | INTRAVENOUS | Status: DC
  Administered 2023-12-02: 38 ug/min via INTRAVENOUS
  Administered 2023-12-03: 36 ug/min via INTRAVENOUS
  Administered 2023-12-03: 40 ug/min via INTRAVENOUS
  Administered 2023-12-03: 34 ug/min via INTRAVENOUS
  Administered 2023-12-04: 22 ug/min via INTRAVENOUS
  Administered 2023-12-04: 32 ug/min via INTRAVENOUS
  Administered 2023-12-04: 29 ug/min via INTRAVENOUS
  Administered 2023-12-05: 20 ug/min via INTRAVENOUS
  Administered 2023-12-06: 13 ug/min via INTRAVENOUS
  Administered 2023-12-07: 11 ug/min via INTRAVENOUS
  Administered 2023-12-09: 4 ug/min via INTRAVENOUS
  Administered 2023-12-10: 2 ug/min via INTRAVENOUS
  Administered 2023-12-11: 3 ug/min via INTRAVENOUS
  Filled 2023-12-02 (×15): qty 250

## 2023-12-02 MED ORDER — ORAL CARE MOUTH RINSE: 15.0000 mL | OROMUCOSAL | Status: DC | PRN

## 2023-12-02 NOTE — Progress Notes (Signed)
 On my evaluation patient becoming increasingly obtunded with PA pressures approaching 100 systolic, PCWP 20, CVP 15-20 with MAP of 55. Bedside TTE with preserved RV function; moderate TR. Vasopressin  increased to 0.06 with improvement in MAP. CRRT volume removal increased from 50cc/hr to 250cc/hr with no significant change in blood pressure. Lactic acid wnl; ABG 7.1/63/57. CCM paged to bedside with transition to Bipap. We also started inhaled nitric for pulmonary hypertension.  Due to persistent hypercapnic respiratory failure and poor mentation, patient intubated by CCM.   At this time will plan on continuing aggressive volume removal with CRRT; target CVP 8-10. Continue inhaled nitric. Repeat CXR to r/o exacerbation of pulmonary edema from pulmonary vasodilators.   CC TIME: 1 hour  Vanessa [name] 8:10 PM

## 2023-12-02 NOTE — Progress Notes (Addendum)
 After initiation of CRRT, pt became lethargic, PA pressures increased, BP decreased and pacer not capturing. Maryjane, MD at bedside turned pacer off. Sabharwal, MD called to bedside. Verbal order to increase vaso to 0.06, levo and epi. I-stat ABG and lactic acid completed per verbal order. Results immediately shown to [place], MD and Colletta, [place] at bedside. Verbal order to increase fluid removal rate from 100 to 200 by Maryjane, MD. [place], MD at bedside as well. Verbal order for CXR and ABG 1 hour after BiPAP and nitric oxide initiation. RT at bedside to begin nitric.   1720: Sabharwal, MD at bedside to assess swan d/t dampened waveform.    1738: 11 beat run of VT. Claudene, MD and Maryjane, MD notified. Verbal order for 150 mg amio bolus.    [id number] Claudene, MD at bedside d/t pt becoming more lethargic and SOB. Intubation preformed by Claudene, MD at this time. See MAR for medication documentation.

## 2023-12-02 NOTE — Progress Notes (Signed)
   12/02/23 2104  Therapy Vitals  Temp 98.8 F (37.1 C)  Pulse Rate 69  Resp (!) 27  MEWS Score/Color  MEWS Score 3  MEWS Score Color Yellow  Oxygen Therapy/Pulse Ox  SpO2 100 %  Airway 7.5 mm  Placement Date/Time: 12/02/23 1833   Placed By: ICU physician  Airway Device: Endotracheal Tube  ETT Types: Oral  Size (mm): 7.5 mm  Cuffed: Cuffed  Placement Confirmation: CXR Confirmed;Bilateral Breath Sounds;Chest Rise;Direct Visualization  Secured...  Secured at (cm) (S)  24 cm (per order)  Measured From Lips   ETT pulled back by 1cm per order

## 2023-12-02 NOTE — Progress Notes (Addendum)
 Patient ID: Vanessa [name], female   DOB: [birth year], 73 y.o.   MRN: [id number]     Advanced Heart Failure Rounding Note  Cardiologist: Dr. Rolan   Chief Complaint: Cardiogenic shock  Subjective:     POD#3 s/p MVR (tissue) and TV ring and same day return to OR for tamponade/bleeding.  Currently on 0.375 milrinone  (increased from 0.3 on 01/01), 7 Epi, 13>>7 NE and 0.04 Vaso.  Swan #s: CVP 16 PAP 80/20 PCWP mean 26 CI 2.87 Co-ox 60%  525 cc UOP last 24 hrs despite IV lasix  80 BID.   Still with high O2 requirement, on 8L HFNC.  CCM removing chest tubes this am.  Nausea improved. Notes some shortness of breath. Doesn't have much appetite.   Limited echo 12/02/23: EF 40-45%, RV function reduced, small pericardial effusions w/ no evidence of tamponade but technically difficult study  Objective:   Weight Range: 97.1 kg Body mass index is 37.92 kg/m.   Vital Signs:   Temp:  [98.6 F (37 C)-99.7 F (37.6 C)] 98.6 F (37 C) (01/02 0645) Pulse Rate:  [67-99] 70 (01/02 0645) Resp:  [15-31] 26 (01/02 0645) BP: (92-136)/(47-117) 133/53 (01/02 0645) SpO2:  [80 %-100 %] 93 % (01/02 0645) Arterial Line BP: (37-143)/(34-104) 124/43 (01/02 0645) FiO2 (%):  [100 %] 100 % (01/01 1514) Weight:  [97.1 kg] 97.1 kg (01/02 0500) Last BM Date :  (PTA)  Weight change: Filed Weights   11/30/23 0500 12/01/23 0500 12/02/23 0500  Weight: 93.8 kg 102.2 kg 97.1 kg   Intake/Output:   Intake/Output Summary (Last 24 hours) at 12/02/2023 9297 Last data filed at 12/02/2023 0615 Gross per 24 hour  Intake 2161.78 ml  Output 625 ml  Net 1536.78 ml    Physical Exam  General: Fatigued appearing. HEENT: normal Neck: supple. JVP to jaw. R internal jugular SWAN Cor: PMI nondisplaced. Regular rate & rhythm. No rubs, gallops or murmurs. Minimal output from CTs.  Lungs: + rales Abdomen: soft, nontender, nondistended.  Extremities: no cyanosis, clubbing, rash, 2+ edema upper and lower  extremities Neuro: alert & orientedx3. Affect anxious.    Telemetry   ? Underlying high-grade AV block, 70s  Labs    CBC Recent Labs    12/01/23 0359 12/02/23 0329  WBC 16.6* 18.8*  HGB 8.8* 8.5*  HCT 25.5* 25.5*  MCV 88.5 91.4  PLT 78* 65*   Basic Metabolic Panel Recent Labs    98/98/74 0359 12/01/23 2131 12/02/23 0329  NA 135 132* 133*  K 4.3 4.5 4.6  CL 106 101 101  CO2 21* 21* 22  GLUCOSE 153* 146* 128*  BUN 34* 41* 40*  CREATININE 1.93* 2.38* 2.54*  CALCIUM  7.1* 6.9* 7.0*  MG 2.9*  --  2.8*   Liver Function Tests Recent Labs    11/30/23 0420  AST 57*  ALT 13  ALKPHOS 24*  BILITOT 7.2*  PROT 4.6*  ALBUMIN  2.8*   No results for input(s): LIPASE, AMYLASE in the last 72 hours. Cardiac Enzymes No results for input(s): CKTOTAL, CKMB, CKMBINDEX, TROPONINI in the last 72 hours.  BNP: BNP (last 3 results) Recent Labs    05/26/23 1610 08/26/23 1103 09/22/23 0905  BNP 778.0* 781.0* 926.0*   ProBNP (last 3 results) No results for input(s): PROBNP in the last 8760 hours.  D-Dimer No results for input(s): DDIMER in the last 72 hours. Hemoglobin A1C No results for input(s): HGBA1C in the last 72 hours. Fasting Lipid Panel Recent Labs    11/30/23  0420  TRIG 90   Thyroid  Function Tests No results for input(s): TSH, T4TOTAL, T3FREE, THYROIDAB in the last 72 hours.  Invalid input(s): FREET3  Other results:  Imaging   DG Chest Port 1 View Result Date: 12/01/2023 CLINICAL DATA:  Shortness of breath.  Post mitral valve replacement. EXAM: PORTABLE CHEST 1 VIEW COMPARISON:  Earlier same day; 11/30/2023; 11/29/2023 FINDINGS: Grossly unchanged enlarged cardiac silhouette and mediastinal contours given differences in patient rotation. There is persistent rightward deviation of the tracheal air column at the level of the thoracic inlet. Interval retraction of pulmonary arterial central venous catheter with tip now projected over  the right atrium. Sequela of previous median sternotomy and valve replacements. Redemonstrated mediastinal drains. No pneumothorax. Pulmonary vasculature remains indistinct with cephalization of flow. Unchanged trace right and small to moderate-sized left-sided effusions with associated bibasilar heterogeneous/consolidative opacities, left-greater-than-right. No acute osseous abnormalities. IMPRESSION: 1. Interval retraction of pulmonary arterial central venous catheter with tip now projected over the right atrium. 2. Otherwise, no significant interval change in the appearance of the chest including pulmonary edema, trace right and small to moderate-sized left-sided effusions with associated bibasilar atelectasis, left-greater-than-right. Electronically Signed   By: Norleen Roulette M.D.   On: 12/01/2023 16:28   ECHOCARDIOGRAM LIMITED Result Date: 12/01/2023    ECHOCARDIOGRAM LIMITED REPORT   Patient Name:   Vanessa [name] Date of Exam: 12/01/2023 Medical Rec #:  [id number]        Height:       63.0 in Accession #:    [id number]       Weight:       225.3 lb Date of Birth:  Mar 29, 1951         BSA:          2.034 m Patient Age:    72 years         BP:           112/45 mmHg Patient Gender: F                HR:           79 bpm. Exam Location:  Inpatient Procedure: Limited Echo, Color Doppler and Cardiac Doppler Indications:    I31.3 Pericardial effusion (noninflammatory)  History:        Patient has prior history of Echocardiogram examinations, most                 recent 11/30/2023. CHF, Pulmonary HTN; Risk                 Factors:Hypertension.                  Mitral Valve: 31 mm Medtronic bioprosthetic valve valve is                 present in the mitral position.  Sonographer:    Damien Senior RDCS Referring Phys: [phone] EZRA RAMAN [facility]  Sonographer Comments: Limited for effusion check IMPRESSIONS  1. Small pericardial effusion which appears anterior to the RV and posterior to the basal aspect of the LV. Difficult study on  images obtained. RV appears dilated with reduced function but not well seen. IVC is dilated and does collapse >50%. Effusion appears similar to the study from the day prior. No evidence of diastolic RV collapse on this limited study. No overt features to suggest cardiac tamponade but limited study with difficult windows for TTE. a small pericardial effusion is present. The pericardial effusion is anterior to the  right ventricle and posterior to the left ventricle.  2. Left ventricular ejection fraction, by estimation, is 40 to 45%. The left ventricle has mildly decreased function. There is the interventricular septum is flattened in systole and diastole, consistent with right ventricular pressure and volume overload.  3. Right ventricular systolic function was not well visualized. The right ventricular size is not well visualized.  4. There is a 31 mm Medtronic bioprosthetic valve present in the mitral position.  5. The tricuspid valve is has been repaired/replaced. The tricuspid valve is status post repair with an annuloplasty ring.  6. The aortic valve is tricuspid.  7. The inferior vena cava is dilated in size with <50% respiratory variability, suggesting right atrial pressure of 15 mmHg. Comparison(s): Changes from prior study are noted. Effusion appears similar to the prior study. LVEF has improved 40-45%. FINDINGS  Left Ventricle: Left ventricular ejection fraction, by estimation, is 40 to 45%. The left ventricle has mildly decreased function. The interventricular septum is flattened in systole and diastole, consistent with right ventricular pressure and volume overload. Right Ventricle: The right ventricular size is not well visualized. Right vetricular wall thickness was not well visualized. Right ventricular systolic function was not well visualized. Pericardium: Small pericardial effusion which appears anterior to the RV and posterior to the basal aspect of the LV. Difficult study on images obtained. RV  appears dilated with reduced function but not well seen. IVC is dilated and does collapse >50%. Effusion appears similar to the study from the day prior. No evidence of diastolic RV collapse on this limited study. No overt features to suggest cardiac tamponade but limited study with difficult windows for TTE. A small pericardial effusion is present. The pericardial effusion is anterior to the right ventricle and posterior to the left ventricle. Mitral Valve: There is a 31 mm Medtronic bioprosthetic valve present in the mitral position. Tricuspid Valve: The tricuspid valve is has been repaired/replaced. The tricuspid valve is status post repair with an annuloplasty ring. Aortic Valve: The aortic valve is tricuspid. Pulmonic Valve: The pulmonic valve was grossly normal. Venous: The inferior vena cava is dilated in size with less than 50% respiratory variability, suggesting right atrial pressure of 15 mmHg. Additional Comments: A venous catheter is visualized in the right ventricle. Spectral Doppler performed. Color Doppler performed.  Darryle Decent MD Electronically signed by Darryle Decent MD Signature Date/Time: 12/01/2023/2:59:38 PM    Final     Medications:     Scheduled Medications:  acetaminophen   1,000 mg Oral Q6H   Or   acetaminophen  (TYLENOL ) oral liquid 160 mg/5 mL  1,000 mg Per Tube Q6H   acetaminophen  (TYLENOL ) oral liquid 160 mg/5 mL  650 mg Per Tube Once   aspirin   324 mg Oral Once   aspirin  EC  325 mg Oral Daily   Or   aspirin   324 mg Per Tube Daily   bisacodyl   10 mg Oral Daily   Or   bisacodyl   10 mg Rectal Daily   Chlorhexidine  Gluconate Cloth  6 each Topical Daily   docusate sodium   200 mg Oral Daily   enoxaparin  (LOVENOX ) injection  30 mg Subcutaneous QHS   influenza vaccine adjuvanted  0.5 mL Intramuscular Tomorrow-1000   insulin  aspart  0-24 Units Subcutaneous Q4H   pantoprazole   40 mg Oral Daily   sodium chloride  flush  3 mL Intravenous Q12H    Infusions:  sodium  chloride     amiodarone  30 mg/hr (12/02/23 9361)   epinephrine  7  mcg/min (12/02/23 9362)   milrinone  0.375 mcg/kg/min (12/02/23 0615)   norepinephrine  (LEVOPHED ) Adult infusion 7 mcg/min (12/02/23 0600)   phenylephrine  (NEO-SYNEPHRINE) Adult infusion Stopped (11/29/23 1235)   vasopressin  0.04 Units/min (12/02/23 0600)    PRN Medications: Place/Maintain arterial line **AND** sodium chloride , artificial tears, HYDROmorphone  (DILAUDID ) injection, levalbuterol , ondansetron  (ZOFRAN ) IV, mouth rinse, oxyCODONE , pneumococcal 20-valent conjugate vaccine, sodium chloride  flush, traMADol    Patient Profile   Vanessa [name] is a 73 y.o. with history of nonischemic cardiomyopathy and severe MR s/p Mitral Clip x2 (2020), pHTN, and CKD IIIb.   Assessment/Plan   1. Post Cardiotomy Cardiogenic/vasoplegic Shock: Bioprosthetic MVR and tricuspid ring 11/29/23 with Dr. Maryjane. Pre-op  cath with no significant CAD.  Pre-op  echo with EF 40-45%, severe MR.  Weaned from bypass to inotropic support intra-op. High sanguineous chest tube output (>1L) with cardiac compromise and increasing pressor requirement.  Went back to the OR with post-op tamponade for clean-out.  Echo 12/31 showed EF 25-30%, moderate RV dysfunction, s/p bioprosthetic mitral valve replacement with trivial MR and tricuspid valve repair with trivial TR.  No significant pericardial effusion. Limited echo 01/01: EF 40-45%, RV appeared reduced, small perciardial effusion w/ no evidence of tamponade. Currently on NE 7, Epi 7, Vaso 0.04, Milrinone  0.375. CI 2.87 from [place] with co-ox 60%.   - CVP 14. Creatinine continues to trend up, now 2.54. Only 500 cc UOP yesterday with 80 mg IV lasix  BID. Give 120 mg lasix  IV then start lasix  gtt at 15/hr. Will give dose of metolazone  5 mg x 1. May end up requiring CVVH.  2. Mitral regurgitation:  TEE in 11/19 with severe MR, restricted posterior leaflet.  She had Mitraclip x 2 in 1/20. Post-op echo showed mild MR and  mild mitral stenosis.  Echo in 1/22 showed moderate MR, mild-moderate MS. Echo in 3/23 showed moderate MR, mild-moderate MS. Echo 9/23 showed mild-moderate MR, mild MS (mean gradient 7 mmHg).  Echo in 9/24 showed MV s/p Mitraclip x 2 with mean gradient 6 mmHg and severe mitral regurgitation. TEE was then done to evaluate, showing 2 Mitraclips in place with mean gradient 4 mmHg and severe eccentric MR from between the 2 clips with ERO 0.54 cm^2.  She was not thought to be a candidate for an additional Mitraclip due to lack of room between the two existing clips. R/LHC (11/24) showed moderate mixed pulmonary arterial and pulmonary venous hypertension likely due to mitral valve regurgitation. S/p MV replacement and Tricuspid repair on 11/29/23 with Dr. Maryjane - Valves stable on post-op echo.  3. PVCs: Frequent (12%) on 10/15 holter.   Pt refused repeat holter monitor for further assessment. PVC count was probably not high enough to cause cardiomyopathy, unlikely primary etiology. Has had short NSVT.  - Continue Amio gtt 4. Pulmonary hypertension: Most recent RHC (11/24) with PVR 4.5, with moderate mixed pulmonary arterial and pulmonary venous hypertension likely due to mitral valve regurgitation. Now s/p MVR, PA pressure 80/20 today. No change with increasing milrinone  to 0.375 - Diuresis today.  5. Toxic multinodular goiter: She is now off methimazole . TSH was normal in 8/24. Follows with Endocrinology 6. AKI on CKD stage 3: Pre-op  creatinine 1.5, up to 2.5 today. Follow closely. Support CO. 7. Anemia: Post-op blood loss.  Hgb 8.5 today, transfuse < 8.  8. Complete heart block: She had post-op CHB.  Now appears in AF.  9. Respiratory failure: Post-op. On 8L HFNC. Pulmonary edema and left pleural effusion on CXR. L effusion too small  for thoracentesis by US  on 01/01. 10. Atrial fibrillation: Patient appears to be in AF with controlled rate currently.    FINCH, LINDSAY N 12/02/2023 7:02 AM  Patient seen  with PA, agree with the above note.   She is awake/alert today.  On HFNC, not short of breath at rest.  Poor diuresis, only 525 cc UOP.  Creatinine up to 2.54.   She is on epinephrine  7, milrinone  0.375, NE 8, vasopressin  0.04.  CI 2.8, co-ox 60.5%.  CVP up to 16, PCWP 26 with PA systolic pressure in 80s.    Echo yesterday with small pericardial effusion, EF 40-45%, bioprosthetic MV stable, TV repair stable, RV dysfunctional but poorly visualized with D-shaped septum.   General: NAD Neck: JVP 16 cm, no thyromegaly or thyroid  nodule.  Lungs: Clear to auscultation bilaterally with normal respiratory effort. CV: Nondisplaced PMI.  Heart regular S1/S2, no S3/S4, no murmur.  No peripheral edema.  No carotid bruit.  Normal pedal pulses.  Abdomen: Soft, nontender, no hepatosplenomegaly, no distention.  Skin: Intact without lesions or rashes.  Neurologic: Alert and oriented x 3.  Psych: Normal affect. Extremities: No clubbing or cyanosis.  HEENT: Normal.   Slowly weaning pressor support but developing worsening volume overload with poor UOP.  Creatinine up again to 2.54.  - Will continue current inotrope/pressors support but could wean NE if MAP allows.  - Will give Lasix  120 mg IV then 15 mg/hr gtt as well as dose of metolazone  5 x 1.  If she does not respond to this, will need CVVH.   Severe pulmonary hypertension.  PCWP elevated at 26 so mixed pulmonary arterial/pulmonary venous hypertension most likely.  With elevated PCWP, will not start NO.   Left pleural effusion looked at least moderate on CXR but only small on US  yesterday.   She had high degree heart block yesterday underlying pacing, today appears to be in atrial fibrillation.  Continue to follow closely.   CRITICAL CARE Performed by: Ezra Shuck  Total critical care time: 45 minutes  Critical care time was exclusive of separately billable procedures and treating other patients.  Critical care was necessary to treat or  prevent imminent or life-threatening deterioration.  Critical care was time spent personally by me on the following activities: development of treatment plan with patient and/or surrogate as well as nursing, discussions with consultants, evaluation of patient's response to treatment, examination of patient, obtaining history from patient or surrogate, ordering and performing treatments and interventions, ordering and review of laboratory studies, ordering and review of radiographic studies, pulse oximetry and re-evaluation of patient's condition.  Ezra Shuck 12/02/2023 8:09 AM

## 2023-12-02 NOTE — Procedures (Signed)
 Central Venous Catheter Insertion Procedure Note  DAHLILA PFAHLER  [id number]  February 15, 1951  Date:12/02/23  Time:3:06 PM   Provider Performing:Annah Jasko Meade   Procedure: Insertion of Non-tunneled Central Venous Catheter(36556)with US  guidance (23062)    Indication(s) Hemodialysis  Consent Risks of the procedure as well as the alternatives and risks of each were explained to the patient and/or caregiver.  Consent for the procedure was obtained and is signed in the bedside chart  Anesthesia Topical only with 1% lidocaine    Timeout Verified patient identification, verified procedure, site/side was marked, verified correct patient position, special equipment/implants available, medications/allergies/relevant history reviewed, required imaging and test results available.  Sterile Technique Maximal sterile technique including full sterile barrier drape, hand hygiene, sterile gown, sterile gloves, mask, hair covering, sterile ultrasound probe cover (if used).  Procedure Description Area of catheter insertion was cleaned with chlorhexidine  and draped in sterile fashion.   With real-time ultrasound guidance a HD catheter was placed into the left internal jugular vein.  Nonpulsatile blood flow and easy flushing noted in all ports.  The catheter was sutured in place and sterile dressing applied.  Complications/Tolerance None; patient tolerated the procedure well. Chest X-ray is ordered to verify placement for internal jugular or subclavian cannulation.  Chest x-ray is not ordered for femoral cannulation.  EBL Minimal  Specimen(s) None   Sammi Meade, PA - C  Pulmonary & Critical Care Medicine For pager details, please see AMION or use Epic chat  After 1900, please call ELINK for cross coverage needs 12/02/2023, 3:06 PM

## 2023-12-02 NOTE — Progress Notes (Signed)
   NAME:  Vanessa [name], MRN:  [id number], DOB:  20-Apr-1951, LOS: 3 ADMISSION DATE:  11/29/2023, CONSULTATION DATE:  11/29/23 REFERRING MD:  Dr. Maryjane, CHIEF COMPLAINT:  postop   History of Present Illness:  72yoF with PMH significant for HTN, NICM, HFrEF (EF40-45%), PHTN, CKD and hx of prior severe MR s/p prior mitral clip x 2 in 2020.  Pt with progressive DOE found to have increasing MR  and moderate TR despite adjustments in GDMT.  Not felt to be candidate for additional mitral clip.  Repeat LHC without CAD but showed moderate PHTN.  Underwent mitral valve replacement with tissue valve and tricuspid valve repair by Dr. Maryjane on 12/30.  PCCM consulted to assist with vent and medical management post-operatively in ICU.   Pertinent  Medical History  Never smoker, MR w/ previous Mitral clip x 2 (2020), NICM, HFrEF, PHTN, HTN, CKD3b  Significant Hospital Events: Including procedures, antibiotic start and stop dates in addition to other pertinent events   MVR by Dr. Maryjane; relook for tamponade/washout later in evening without obvious source  Interim History / Subjective:  No events Still on good bit of vasopressor support and oliguric.  Objective   Blood pressure (!) 133/53, pulse 70, temperature 98.6 F (37 C), resp. rate (!) 26, height 5' 3 (1.6 m), weight 97.1 kg, SpO2 93%. PAP: (44-95)/(15-41) 78/23 CVP:  [0 mmHg-38 mmHg] 14 mmHg PCWP:  [20 mmHg] 20 mmHg CO:  [4.9 L/min-5.6 L/min] 5.1 L/min CI:  [2.62 L/min/m2-3.03 L/min/m2] 2.7 L/min/m2  FiO2 (%):  [100 %] 100 %   Intake/Output Summary (Last 24 hours) at 12/02/2023 0736 Last data filed at 12/02/2023 9384 Gross per 24 hour  Intake 2161.78 ml  Output 625 ml  Net 1536.78 ml   Filed Weights   11/30/23 0500 12/01/23 0500 12/02/23 0500  Weight: 93.8 kg 102.2 kg 97.1 kg   Examination: No distress Breath sounds diminished bases Chest tubes minimal output Moves to command Aox3 Mildly tachypneic Sternotomy looks okay CHB on  monitor rate okay  Coox 60 epi 7, milrinone  0.375, levo 7, vaso 0.04, amio 30 WBC drifting up, plts drifting down suspect latter is reactive  Resolved Hospital Problem list   Postoperative vent management  Assessment & Plan:  Severe  MR( s/p prior mitral clip x 2 in 2020) now s/p tissue MVR Moderate TR s/p TV repair  Moderate PHTN NICM/ HFrEF, EF 40-45% Post bypass vasoplegia and cardioplegia a little better Postop tamponade resolved after relook and washout POD #0 Postop hypoxemia combination atelectasis and volume L effusion too small for intervention 1/1 US  Expected post-operative ABLA AKI on CKD3b now query cardiorenal vs. Atn from initial hemodynamic insults  P:  - drains likely out today - inotrope and diuretic management per AHF/TCTS discussion - levo/vaso for MAP 65 - encourage chair position, IS, braced coughing - lasix  challenge; if fails to start CRRT - can use BIPAP PRN, hopefully can avoid reintubation  Best Practice (right click and Reselect all SmartList Selections daily)   Diet/type:regular DVT prophylaxis lovenox  this evening Pressure ulcer(s): None GI prophylaxis: PPI Lines: Central line and Arterial Line Foley:  Yes, and it is still needed Code Status:  full code Last date of multidisciplinary goals of care discussion [per primary]  33 min cc time  Rolan Sharps MD PCCM

## 2023-12-02 NOTE — Procedures (Signed)
 Intubation Procedure Note  Vanessa [name]  [id number]  March 22, 1951  Date:12/02/23  Time:6:34 PM   Provider Performing:Larkyn Greenberger C Claudene    Procedure: Intubation (31500)  Indication(s) Respiratory Failure  Consent Verbal from patient  Anesthesia Etomidate , Versed , and Fentanyl    Time Out Verified patient identification, verified procedure, site/side was marked, verified correct patient position, special equipment/implants available, medications/allergies/relevant history reviewed, required imaging and test results available.   Sterile Technique Usual hand hygeine, masks, and gloves were used   Procedure Description Patient positioned in bed supine.  Sedation given as noted above.  Patient was intubated with endotracheal tube using Glidescope.  View was Grade 1 full glottis .  Number of attempts was 1.  Colorimetric CO2 detector was consistent with tracheal placement.   Complications/Tolerance None; patient tolerated the procedure well. Chest X-ray is ordered to verify placement.   EBL Minimal   Specimen(s) None

## 2023-12-02 NOTE — Consult Note (Signed)
 Nephrology Consult   Requesting provider: Deward Kallman Service requesting consult: CT surg Reason for consult: AKI   Assessment/Recommendations: [name] is a/an 73 y.o. female with a past medical history HTN, NICM, CHF, pulmonary hypertension, mitral and tricuspid valve disease  who present w/ surgery for valvular disease c/b shock, AHRF, and AKI   Oliguric AKI on CKD 3A: Mild baseline CKD likely multifactorial.  Now with AKI likely ATN and cardiorenal syndrome in the setting of shock.  No obvious uremic symptoms but nearing the need for intubation with worsening pulmonary edema and volume overload.  Therefore we will start CRRT but continue with diuresis -Start CRRT today -Dialysis catheter placed by CCM, appreciate help -Continue Lasix  infusion and consider uptitrating, consider additional metolazone  -Continue to monitor daily Cr, Dose meds for GFR -Monitor Daily I/Os, Daily weight  -Maintain MAP>65 for optimal renal perfusion.  -Avoid nephrotoxic medications including NSAIDs -Use synthetic opioids (Fentanyl /Dilaudid ) if needed  Multifactorial shock: Vasoplegic and cardioplegia catheter surgery.  On pressors and inotropes.  Continue management per primary team  Acute exacerbation of heart failure with reduced ejection fraction: Volume overload.  Worsening pulmonary edema.  Diuretics and CRT as above.  Severe MR and tricuspid valvular disease: Status post repair.  CT surgery managing  Postoperative cardiac tamponade: Continue monitoring per cardiology  Hyponatremia: In the setting of AKI.  Continue to monitor  Anemia: Related to blood loss predominantly.  Transfusions per primary team    Recommendations conveyed to primary service.    Jayson JINNY [facility] 12/02/2023 2:53 PM   _____________________________________________________________________________________ CC: valvular disease  History of Present Illness: Vanessa [name] is a/an 73 y.o.  female with a past medical history of HTN, NICM, CHF, pulmonary hypertension, mitral and tricuspid valve disease who presents with planned surgical intervention for valvular disease.  Patient was admitted 3 days ago and underwent operation for mitral valve replacement and tricuspid valve repair with Dr.Weldner on 12/30.  She had had persistent symptomatic disease related to her heart valve.  She also had pulmonary hypertension.  In the outpatient setting she had a left heart catheterization without significant obstructive CAD.  Surgery was complicated by postoperative bleeding causing tamponade requiring return to surgery.  She came out of the surgery intubated but was ultimately able to be extubated.  She has required pressors and inotropes postoperatively.  Since surgery she has had a rising CVP, worsening hypoxia, moderate urine output with Lasix .  Creatinine prior to surgery was around 1.4-1.6.  Creatinine has been rising up to 2.7 today.   Medications:  Current Facility-Administered Medications  Medication Dose Route Frequency Provider Last Rate Last Admin   0.9 %  sodium chloride  infusion   Intra-arterial PRN Weldner, Paul, MD       acetaminophen  (TYLENOL ) tablet 1,000 mg  1,000 mg Oral Q6H Zimmerman, Donielle M, PA-C   1,000 mg at 12/02/23 1126   Or   acetaminophen  (TYLENOL ) 160 MG/5ML solution 1,000 mg  1,000 mg Per Tube Q6H Zimmerman, Donielle M, PA-C   1,000 mg at 11/30/23 0500   acetaminophen  (TYLENOL ) 160 MG/5ML solution 650 mg  650 mg Per Tube Once Dwan Aldo M, PA-C       amiodarone  (NEXTERONE  PREMIX) 360-4.14 MG/200ML-% (1.8 mg/mL) IV infusion  30 mg/hr Intravenous Continuous Kallman Deward, MD 16.67 mL/hr at 12/02/23 1146 30 mg/hr at 12/02/23 1146   artificial tears (LACRILUBE) ophthalmic ointment   Both Eyes Q4H PRN Kallman Deward, MD       aspirin   EC tablet 325 mg  325 mg Oral Daily Zimmerman, Donielle M, PA-C   325 mg at 12/02/23 9095   Or   aspirin  chewable tablet 324 mg   324 mg Per Tube Daily Dwan Aldo M, PA-C       bisacodyl  (DULCOLAX) EC tablet 10 mg  10 mg Oral Daily Zimmerman, Donielle M, PA-C   10 mg at 12/02/23 9095   Or   bisacodyl  (DULCOLAX) suppository 10 mg  10 mg Rectal Daily Zimmerman, Donielle M, PA-C       Chlorhexidine  Gluconate Cloth 2 % PADS 6 each  6 each Topical Daily Maryjane Mt, MD   6 each at 12/02/23 9095   docusate sodium  (COLACE) capsule 200 mg  200 mg Oral Daily Zimmerman, Donielle M, PA-C   200 mg at 12/02/23 9095   enoxaparin  (LOVENOX ) injection 30 mg  30 mg Subcutaneous QHS Maryjane Mt, MD   30 mg at 12/01/23 2106   EPINEPHrine  (ADRENALIN ) 5 mg in NS 250 mL (0.02 mg/mL) premix infusion  0.5-20 mcg/min Intravenous Titrated Maryjane Mt, MD 24 mL/hr at 12/02/23 1400 8 mcg/min at 12/02/23 1400   furosemide  (LASIX ) 200 mg in dextrose  5 % 100 mL (2 mg/mL) infusion  15 mg/hr Intravenous Continuous Rolan Ezra RAMAN, MD 7.5 mL/hr at 12/02/23 1400 15 mg/hr at 12/02/23 1400   HYDROmorphone  (DILAUDID ) injection 0.5 mg  0.5 mg Intravenous Q3H PRN Smith, Daniel C, MD       influenza vaccine adjuvanted (FLUAD) injection 0.5 mL  0.5 mL Intramuscular Tomorrow-1000 Claudene Toribio BROCKS, MD       insulin  aspart (novoLOG ) injection 0-24 Units  0-24 Units Subcutaneous Q4H Weldner, Paul, MD   2 Units at 12/01/23 2321   levalbuterol  (XOPENEX ) nebulizer solution 0.63 mg  0.63 mg Nebulization Q6H PRN Maryjane Mt, MD   0.63 mg at 12/02/23 0742   milrinone  (PRIMACOR ) 20 MG/100 ML (0.2 mg/mL) infusion  0.375 mcg/kg/min (Order-Specific) Intravenous Continuous Rolan Ezra RAMAN, MD 9.56 mL/hr at 12/02/23 1400 0.375 mcg/kg/min at 12/02/23 1400   norepinephrine  (LEVOPHED ) 4mg  in (0.016 mg/mL) premix infusion  0-40 mcg/min Intravenous Titrated Maryjane Mt, MD 22.5 mL/hr at 12/02/23 1400 6 mcg/min at 12/02/23 1400   ondansetron  (ZOFRAN ) injection 4 mg  4 mg Intravenous Q6H PRN Zimmerman, Donielle M, PA-C   4 mg at 12/01/23 1102   Oral care mouth rinse   15 mL Mouth Rinse PRN Maryjane Mt, MD       oxyCODONE  (Oxy IR/ROXICODONE ) immediate release tablet 5 mg  5 mg Oral Q3H PRN Zimmerman, Donielle M, PA-C       pantoprazole  (PROTONIX ) EC tablet 40 mg  40 mg Oral Daily Zimmerman, Donielle M, PA-C   40 mg at 12/02/23 9095   pneumococcal 20-valent conjugate vaccine (PREVNAR 20) injection 0.5 mL  0.5 mL Intramuscular Prior to discharge Claudene Toribio BROCKS, MD       sodium chloride  flush (NS) 0.9 % injection 3 mL  3 mL Intravenous Q12H Zimmerman, Donielle M, PA-C   3 mL at 12/01/23 2106   sodium chloride  flush (NS) 0.9 % injection 3 mL  3 mL Intravenous PRN Zimmerman, Donielle M, PA-C       traMADol  (ULTRAM ) tablet 50-100 mg  50-100 mg Oral Q4H PRN Zimmerman, Donielle M, PA-C       vasopressin  (PITRESSIN) 20 Units in 100 mL (0.2 unit/mL) infusion-*FOR SHOCK*  0-0.04 Units/min Intravenous Continuous Maryjane Mt, MD 12 mL/hr at 12/02/23 1400 0.04 Units/min at 12/02/23 1400  ALLERGIES Patient has no known allergies.  MEDICAL HISTORY Past Medical History:  Diagnosis Date   CHF (congestive heart failure) (HCC)    CKD (chronic kidney disease)    Heart murmur    Hypertension    Moderate mitral regurgitation    moderate to severe MR with moderate pulmonary HTN   Multinodular goiter    Nonischemic cardiomyopathy (HCC)    EF 30-35% by echo 2015   Pulmonary HTN (HCC)    moderate with PASP by echo 2015   PVC's (premature ventricular contractions)      SOCIAL HISTORY Social History   Socioeconomic History   Marital status: Single    Spouse name: Not on file   Number of children: Not on file   Years of education: Not on file   Highest education level: Not on file  Occupational History   Occupation: Retired    Comment: Textile, [employer]  Tobacco Use   Smoking status: Never   Smokeless tobacco: Never  Vaping Use   Vaping status: Never Used  Substance and Sexual Activity   Alcohol use: No    Alcohol/week: 0.0 standard  drinks of alcohol   Drug use: No   Sexual activity: Not on file  Other Topics Concern   Not on file  Social History Narrative   Not on file   Social Drivers of Health   Financial Resource Strain: Not on file  Food Insecurity: No Food Insecurity (11/30/2023)   Hunger Vital Sign    Worried About Running Out of Food in the Last Year: Never true    Ran Out of Food in the Last Year: Never true  Transportation Needs: No Transportation Needs (11/30/2023)   PRAPARE - Administrator, Civil Service (Medical): No    Lack of Transportation (Non-Medical): No  Physical Activity: Not on file  Stress: Not on file  Social Connections: Moderately Integrated (11/30/2023)   Social Connection and Isolation Panel [NHANES]    Frequency of Communication with Friends and Family: More than three times a week    Frequency of Social Gatherings with Friends and Family: Three times a week    Attends Religious Services: More than 4 times per year    Active Member of Clubs or Organizations: Yes    Attends Banker Meetings: More than 4 times per year    Marital Status: Separated  Intimate Partner Violence: Not At Risk (11/30/2023)   Humiliation, Afraid, Rape, and Kick questionnaire    Fear of Current or Ex-Partner: No    Emotionally Abused: No    Physically Abused: No    Sexually Abused: No     FAMILY HISTORY Family History  Problem Relation Age of Onset   Breast cancer Mother 11   Heart disease Father    Breast cancer Sister 45   Colon cancer Neg Hx    Esophageal cancer Neg Hx    Rectal cancer Neg Hx    Stomach cancer Neg Hx    Thyroid  disease Neg Hx       Review of Systems: 12 systems reviewed Otherwise as per HPI, all other systems reviewed and negative  Physical Exam: Vitals:   12/02/23 1430 12/02/23 1445  BP: 136/69   Pulse: 66 65  Resp: 19 (!) 21  Temp: 98.4 F (36.9 C) 98.4 F (36.9 C)  SpO2: 93% 93%   Total I/O In: 574.1 [I.V.:524.1; IV  Piggyback:50.1] Out: 540 [Urine:540]  Intake/Output Summary (Last 24 hours) at 12/02/2023  1453 Last data filed at 12/02/2023 1400 Gross per 24 hour  Intake 2099.97 ml  Output 900 ml  Net 1199.97 ml   General: Ill-appearing, lying in bed, mild distress HEENT: anicteric sclera, oropharynx clear without lesions CV: Normal rate, distant heart sounds, 1+ pitting edema in the bilateral lower extremities, prominent JVD Lungs: Moderate increased work of breathing, decreased breath sounds at the bases, crackles heard bilaterally Abd: soft, non-tender, non-distended Skin: no visible lesions or rashes Psych: alert, engaged, appropriate mood and affect Musculoskeletal: no obvious deformities Neuro: normal speech, no gross focal deficits   Test Results Reviewed Lab Results  Component Value Date   NA 131 (L) 12/02/2023   K 4.6 12/02/2023   CL 97 (L) 12/02/2023   CO2 21 (L) 12/02/2023   BUN 43 (H) 12/02/2023   CREATININE 2.72 (H) 12/02/2023   GFR 63.02 06/06/2015   CALCIUM  7.1 (L) 12/02/2023   ALBUMIN  2.8 (L) 11/30/2023    CBC Recent Labs  Lab 11/30/23 1657 11/30/23 1742 12/01/23 0359 12/02/23 0329  WBC 15.0*  --  16.6* 18.8*  HGB 9.2* 8.8* 8.8* 8.5*  HCT 26.7* 26.0* 25.5* 25.5*  MCV 88.1  --  88.5 91.4  PLT 91*  --  78* 65*    I have reviewed all relevant outside healthcare records related to the patient's current hospitalization   The patient is critically ill with cardiogenic shock, anemia, hyponatremia, acute hypoxic respiratory failure, and acute kidney injury and which includes my role to primarily manage acute kidney injury.  This requires high complexity decision making.  Total critical care time: 52 minutes    Critical care time was exclusive of treating other patients.   Critical care was necessary to treat or prevent imminent or life-threatening deterioration.   Critical care was time spent personally by me on the following activities:   development of treatment  plan with patient and/or surrogate as well as nursing,   discussions with other provider evaluation of patient's response to treatment  examination of patient  obtaining history from patient or surrogate  ordering and performing treatments and interventions  ordering and review of laboratory studies  ordering and review of radiographic studies

## 2023-12-02 NOTE — Progress Notes (Signed)
 Worsened mental status, CO2 retention on gas. Increased WOB even with BIPAP Will reintubate and allow time for fluid removal. iNO added for RV failure/pulm HTN with relatively low wedge by [facility]  Myrla Halsted MD PCCM

## 2023-12-02 NOTE — Progress Notes (Signed)
 [address]       [place] [address]             [phone]      3 Days Post-Op  Procedure(s) (LRB): EXPLORATION POST OPERATIVE OPEN HEART (N/A)   Total Length of Stay:  LOS: 3 days    SUBJECTIVE: Has no complaints Little response to diuretics Out of CHB   Vitals:   12/02/23 0630 12/02/23 0645  BP: (!) 136/50 (!) 133/53  Pulse: 70 70  Resp: (!) 26 (!) 26  Temp: 98.6 F (37 C) 98.6 F (37 C)  SpO2: 92% 93%    Intake/Output      01/01 0701 01/02 0700 01/02 0701 01/03 0700   P.O.     I.V. (mL/kg) 2161.8 (22.3)    IV Piggyback     Total Intake(mL/kg) 2161.8 (22.3)    Urine (mL/kg/hr) 525 (0.2)    Chest Tube 100    Total Output 625    Net +1536.8             sodium chloride      amiodarone  30 mg/hr (12/02/23 9361)   epinephrine  7 mcg/min (12/02/23 9362)   furosemide  (LASIX ) 200 mg in dextrose  5 % 100 mL (2 mg/mL) infusion     milrinone  0.375 mcg/kg/min (12/02/23 0615)   norepinephrine  (LEVOPHED ) Adult infusion 7 mcg/min (12/02/23 0600)   vasopressin  0.04 Units/min (12/02/23 0600)    CBC    Component Value Date/Time   WBC 18.8 (H) 12/02/2023 0329   RBC 2.79 (L) 12/02/2023 0329   HGB 8.5 (L) 12/02/2023 0329   HCT 25.5 (L) 12/02/2023 0329   PLT 65 (L) 12/02/2023 0329   MCV 91.4 12/02/2023 0329   MCH 30.5 12/02/2023 0329   MCHC 33.3 12/02/2023 0329   RDW 15.2 12/02/2023 0329   LYMPHSABS 1.1 07/24/2022 1528   MONOABS 0.6 07/24/2022 1528   EOSABS 0.0 07/24/2022 1528   BASOSABS 0.0 07/24/2022 1528   CMP     Component Value Date/Time   NA 133 (L) 12/02/2023 0329   K 4.6 12/02/2023 0329   CL 101 12/02/2023 0329   CO2 22 12/02/2023 0329   GLUCOSE 128 (H) 12/02/2023 0329   BUN 40 (H) 12/02/2023 0329   CREATININE 2.54 (H) 12/02/2023 0329   CREATININE 1.30 (H) 04/29/2016 1144   CALCIUM  7.0 (L) 12/02/2023 0329   PROT 4.6 (L) 11/30/2023 0420   ALBUMIN  2.8 (L) 11/30/2023 0420   AST 57 (H) 11/30/2023 0420   ALT 13 11/30/2023  0420   ALKPHOS 24 (L) 11/30/2023 0420   BILITOT 7.2 (H) 11/30/2023 0420   GFRNONAA 20 (L) 12/02/2023 0329   GFRAA 42 (L) 08/19/2020 1304   ABG    Component Value Date/Time   PHART 7.299 (L) 11/30/2023 1742   PCO2ART 41.5 11/30/2023 1742   PO2ART 62 (L) 11/30/2023 1742   HCO3 20.3 11/30/2023 1742   TCO2 22 11/30/2023 1742   ACIDBASEDEF 6.0 (H) 11/30/2023 1742   O2SAT 60.5 12/02/2023 0329   CBG (last 3)  Recent Labs    12/01/23 2008 12/01/23 2317 12/02/23 0333  GLUCAP 121* 133* 112*  EXAM Lungs: decreased bilaterally Card: RR no murmur Ext: warm Neuro: intact   ASSESSMENT: POD #3 SP MV replacement TV repair Hemodynamics: Coox 60 with CI 2.7 on epi, norepi, milrinone , vaso. Will wean as tolerated. AHF has mean of 65 to tirtrate. Still with significant PHTN. CVP 11-13   Echo yesterday with no sign effusion  and EF 40-45% Heart Rhythm: appears NSR right now. Will leave amio on for now Renal insufficiency: will need to remove fluid. AHF will use lasix  drip then CVVH if unsuccesful Thrombocytopenia: post op  will follow for now Pulmonary: on less oxygen demand but will need to remove fluid DC Chest tubes today   Deward Kallman, MD 12/02/2023

## 2023-12-02 NOTE — Progress Notes (Signed)
 Nitric was started at 10 ppm through BIPAP at 17:28 per MD order.

## 2023-12-02 NOTE — Procedures (Signed)
 Arterial Catheter Insertion Procedure Note  CALENE PARADISO  [id number]  May 06, 1951  Date:12/02/23  Time:12:30 AM    Provider Performing: Vicenta KANDICE Pizza    Procedure: Insertion of Arterial Line (63379) without US  guidance  Indication(s) Blood pressure monitoring and/or need for frequent ABGs  Consent Risks of the procedure as well as the alternatives and risks of each were explained to the patient and/or caregiver.  Consent for the procedure was obtained and is signed in the bedside chart  Anesthesia None   Time Out Verified patient identification, verified procedure, site/side was marked, verified correct patient position, special equipment/implants available, medications/allergies/relevant history reviewed, required imaging and test results available.   Sterile Technique Maximal sterile technique including full sterile barrier drape, hand hygiene, sterile gown, sterile gloves, mask, hair covering, sterile ultrasound probe cover (if used).   Procedure Description Area of catheter insertion was cleaned with chlorhexidine  and draped in sterile fashion. Without real-time ultrasound guidance an arterial catheter was placed into the right radial artery.  Appropriate arterial tracings confirmed on monitor.     Complications/Tolerance None; patient tolerated the procedure well.   EBL Minimal   Specimen(s) None

## 2023-12-02 NOTE — Plan of Care (Signed)
  Problem: Education: Goal: Knowledge of General Education information will improve Description: Including pain rating scale, medication(s)/side effects and non-pharmacologic comfort measures Outcome: Progressing   Problem: Pain Management: Goal: General experience of comfort will improve Outcome: Progressing   Problem: Safety: Goal: Ability to remain free from injury will improve Outcome: Progressing   Problem: Skin Integrity: Goal: Risk for impaired skin integrity will decrease Outcome: Progressing

## 2023-12-03 ENCOUNTER — Inpatient Hospital Stay (HOSPITAL_COMMUNITY): Payer: PPO

## 2023-12-03 ENCOUNTER — Ambulatory Visit: Payer: PPO | Admitting: "Endocrinology

## 2023-12-03 DIAGNOSIS — J810 Acute pulmonary edema: Secondary | ICD-10-CM | POA: Diagnosis not present

## 2023-12-03 DIAGNOSIS — J9601 Acute respiratory failure with hypoxia: Secondary | ICD-10-CM | POA: Diagnosis not present

## 2023-12-03 DIAGNOSIS — I959 Hypotension, unspecified: Secondary | ICD-10-CM | POA: Diagnosis not present

## 2023-12-03 DIAGNOSIS — Z952 Presence of prosthetic heart valve: Secondary | ICD-10-CM | POA: Diagnosis not present

## 2023-12-03 DIAGNOSIS — J9602 Acute respiratory failure with hypercapnia: Secondary | ICD-10-CM

## 2023-12-03 DIAGNOSIS — T8111XA Postprocedural  cardiogenic shock, initial encounter: Secondary | ICD-10-CM | POA: Diagnosis not present

## 2023-12-03 LAB — CBC
HCT: 24.9 % — ABNORMAL LOW (ref 36.0–46.0)
Hemoglobin: 8.1 g/dL — ABNORMAL LOW (ref 12.0–15.0)
MCH: 30.1 pg (ref 26.0–34.0)
MCHC: 32.5 g/dL (ref 30.0–36.0)
MCV: 92.6 fL (ref 80.0–100.0)
Platelets: 66 10*3/uL — ABNORMAL LOW (ref 150–400)
RBC: 2.69 MIL/uL — ABNORMAL LOW (ref 3.87–5.11)
RDW: 15.6 % — ABNORMAL HIGH (ref 11.5–15.5)
WBC: 19.1 10*3/uL — ABNORMAL HIGH (ref 4.0–10.5)
nRBC: 0.9 % — ABNORMAL HIGH (ref 0.0–0.2)

## 2023-12-03 LAB — GLUCOSE, CAPILLARY
Glucose-Capillary: 108 mg/dL — ABNORMAL HIGH (ref 70–99)
Glucose-Capillary: 125 mg/dL — ABNORMAL HIGH (ref 70–99)
Glucose-Capillary: 129 mg/dL — ABNORMAL HIGH (ref 70–99)
Glucose-Capillary: 135 mg/dL — ABNORMAL HIGH (ref 70–99)
Glucose-Capillary: 146 mg/dL — ABNORMAL HIGH (ref 70–99)

## 2023-12-03 LAB — POCT I-STAT 7, (LYTES, BLD GAS, ICA,H+H)
Acid-base deficit: 3 mmol/L — ABNORMAL HIGH (ref 0.0–2.0)
Acid-base deficit: 3 mmol/L — ABNORMAL HIGH (ref 0.0–2.0)
Bicarbonate: 23.9 mmol/L (ref 20.0–28.0)
Bicarbonate: 24.2 mmol/L (ref 20.0–28.0)
Calcium, Ion: 1.04 mmol/L — ABNORMAL LOW (ref 1.15–1.40)
Calcium, Ion: 1.11 mmol/L — ABNORMAL LOW (ref 1.15–1.40)
HCT: 26 % — ABNORMAL LOW (ref 36.0–46.0)
HCT: 26 % — ABNORMAL LOW (ref 36.0–46.0)
Hemoglobin: 8.8 g/dL — ABNORMAL LOW (ref 12.0–15.0)
Hemoglobin: 8.8 g/dL — ABNORMAL LOW (ref 12.0–15.0)
O2 Saturation: 89 %
O2 Saturation: 99 %
Patient temperature: 37.1
Patient temperature: 37.2
Potassium: 4.1 mmol/L (ref 3.5–5.1)
Potassium: 4.5 mmol/L (ref 3.5–5.1)
Sodium: 134 mmol/L — ABNORMAL LOW (ref 135–145)
Sodium: 134 mmol/L — ABNORMAL LOW (ref 135–145)
TCO2: 25 mmol/L (ref 22–32)
TCO2: 26 mmol/L (ref 22–32)
pCO2 arterial: 51.8 mm[Hg] — ABNORMAL HIGH (ref 32–48)
pCO2 arterial: 53.1 mm[Hg] — ABNORMAL HIGH (ref 32–48)
pH, Arterial: 7.268 — ABNORMAL LOW (ref 7.35–7.45)
pH, Arterial: 7.272 — ABNORMAL LOW (ref 7.35–7.45)
pO2, Arterial: 177 mm[Hg] — ABNORMAL HIGH (ref 83–108)
pO2, Arterial: 65 mm[Hg] — ABNORMAL LOW (ref 83–108)

## 2023-12-03 LAB — RENAL FUNCTION PANEL
Albumin: 3.1 g/dL — ABNORMAL LOW (ref 3.5–5.0)
Albumin: 3.6 g/dL (ref 3.5–5.0)
Anion gap: 9 (ref 5–15)
Anion gap: 10 (ref 5–15)
BUN: 33 mg/dL — ABNORMAL HIGH (ref 8–23)
BUN: 28 mg/dL — ABNORMAL HIGH (ref 8–23)
CO2: 22 mmol/L (ref 22–32)
CO2: 23 mmol/L (ref 22–32)
Calcium: 7.8 mg/dL — ABNORMAL LOW (ref 8.9–10.3)
Calcium: 8.1 mg/dL — ABNORMAL LOW (ref 8.9–10.3)
Chloride: 100 mmol/L (ref 98–111)
Chloride: 101 mmol/L (ref 98–111)
Creatinine, Ser: 2.23 mg/dL — ABNORMAL HIGH (ref 0.44–1.00)
Creatinine, Ser: 1.9 mg/dL — ABNORMAL HIGH (ref 0.44–1.00)
GFR, Estimated: 23 mL/min — ABNORMAL LOW (ref >60)
GFR, Estimated: 28 mL/min — ABNORMAL LOW (ref >60)
Glucose, Bld: 144 mg/dL — ABNORMAL HIGH (ref 70–99)
Glucose, Bld: 152 mg/dL — ABNORMAL HIGH (ref 70–99)
Phosphorus: 4.2 mg/dL (ref 2.5–4.6)
Phosphorus: 3.8 mg/dL (ref 2.5–4.6)
Potassium: 4.1 mmol/L (ref 3.5–5.1)
Potassium: 4.2 mmol/L (ref 3.5–5.1)
Sodium: 131 mmol/L — ABNORMAL LOW (ref 135–145)
Sodium: 134 mmol/L — ABNORMAL LOW (ref 135–145)

## 2023-12-03 LAB — BLOOD GAS, ARTERIAL
Acid-base deficit: 4.6 mmol/L — ABNORMAL HIGH (ref 0.0–2.0)
Bicarbonate: 22.9 mmol/L (ref 20.0–28.0)
Drawn by: 7277
O2 Saturation: 98.9 %
Patient temperature: 37
pCO2 arterial: 51 mm[Hg] — ABNORMAL HIGH (ref 32–48)
pH, Arterial: 7.26 — ABNORMAL LOW (ref 7.35–7.45)
pO2, Arterial: 85 mm[Hg] (ref 83–108)

## 2023-12-03 LAB — COOXEMETRY PANEL
Carboxyhemoglobin: 1.7 % — ABNORMAL HIGH (ref 0.5–1.5)
Methemoglobin: 0.8 % (ref 0.0–1.5)
O2 Saturation: 73.6 %
Total hemoglobin: 8.3 g/dL — ABNORMAL LOW (ref 12.0–16.0)

## 2023-12-03 LAB — MAGNESIUM
Magnesium: 2.5 mg/dL — ABNORMAL HIGH (ref 1.7–2.4)
Magnesium: 2.8 mg/dL — ABNORMAL HIGH (ref 1.7–2.4)
Magnesium: 2.8 mg/dL — ABNORMAL HIGH (ref 1.7–2.4)

## 2023-12-03 LAB — LACTIC ACID, PLASMA: Lactic Acid, Venous: 1.1 mmol/L (ref 0.5–1.9)

## 2023-12-03 MED ORDER — VANCOMYCIN HCL 2000 MG/400ML IV SOLN
2000.0000 mg | Freq: Once | INTRAVENOUS | Status: AC
  Administered 2023-12-03: 2000 mg via INTRAVENOUS
  Filled 2023-12-03: qty 400

## 2023-12-03 MED ORDER — REVEFENACIN 175 MCG/3ML IN SOLN
175.0000 ug | Freq: Every day | RESPIRATORY_TRACT | Status: DC
  Administered 2023-12-04 – 2023-12-11 (×8): 175 ug via RESPIRATORY_TRACT
  Filled 2023-12-03 (×8): qty 3

## 2023-12-03 MED ORDER — PANTOPRAZOLE SODIUM 40 MG IV SOLR
40.0000 mg | INTRAVENOUS | Status: DC
  Administered 2023-12-03 – 2023-12-11 (×9): 40 mg via INTRAVENOUS
  Filled 2023-12-03 (×9): qty 10

## 2023-12-03 MED ORDER — MAGNESIUM SULFATE IN D5W 1-5 GM/100ML-% IV SOLN
1.0000 g | INTRAVENOUS | Status: AC
  Administered 2023-12-03: 1 g via INTRAVENOUS
  Filled 2023-12-03: qty 100

## 2023-12-03 MED ORDER — PROSOURCE TF20 ENFIT COMPATIBL EN LIQD
60.0000 mL | Freq: Every day | ENTERAL | Status: DC
Start: 2023-12-03 — End: 2023-12-03
  Administered 2023-12-03: 60 mL
  Filled 2023-12-03: qty 60

## 2023-12-03 MED ORDER — HYDROCORTISONE SOD SUC (PF) 100 MG IJ SOLR
100.0000 mg | Freq: Two times a day (BID) | INTRAMUSCULAR | Status: DC
  Administered 2023-12-03 – 2023-12-06 (×7): 100 mg via INTRAVENOUS
  Filled 2023-12-03 (×8): qty 2

## 2023-12-03 MED ORDER — PIVOT 1.5 CAL PO LIQD: 1000.0000 mL | ORAL | Status: DC

## 2023-12-03 MED ORDER — LIDOCAINE HCL (CARDIAC) PF 100 MG/5ML IV SOSY
100.0000 mg | PREFILLED_SYRINGE | Freq: Once | INTRAVENOUS | Status: AC
  Administered 2023-12-03: 100 mg via INTRAVENOUS
  Filled 2023-12-03: qty 5

## 2023-12-03 MED ORDER — VITAL AF 1.2 CAL PO LIQD
1000.0000 mL | ORAL | Status: DC
  Administered 2023-12-03 – 2023-12-05 (×3): 1000 mL

## 2023-12-03 MED ORDER — RENA-VITE PO TABS
1.0000 | ORAL_TABLET | Freq: Every day | ORAL | Status: DC
  Administered 2023-12-03 – 2023-12-13 (×11): 1
  Filled 2023-12-03 (×11): qty 1

## 2023-12-03 MED ORDER — ARFORMOTEROL TARTRATE 15 MCG/2ML IN NEBU
15.0000 ug | INHALATION_SOLUTION | Freq: Two times a day (BID) | RESPIRATORY_TRACT | Status: DC
Start: 2023-12-03 — End: 2023-12-11
  Administered 2023-12-03 – 2023-12-11 (×16): 15 ug via RESPIRATORY_TRACT
  Filled 2023-12-03 (×16): qty 2

## 2023-12-03 MED ORDER — POLYETHYLENE GLYCOL 3350 17 G PO PACK
17.0000 g | PACK | Freq: Two times a day (BID) | ORAL | Status: DC
  Administered 2023-12-03 – 2023-12-04 (×3): 17 g
  Filled 2023-12-03 (×3): qty 1

## 2023-12-03 MED ORDER — BISACODYL 10 MG RE SUPP
10.0000 mg | Freq: Once | RECTAL | Status: AC
  Administered 2023-12-03: 10 mg via RECTAL
  Filled 2023-12-03: qty 1

## 2023-12-03 MED ORDER — SENNA 8.6 MG PO TABS
1.0000 | ORAL_TABLET | Freq: Every day | ORAL | Status: DC
Start: 2023-12-03 — End: 2023-12-05
  Administered 2023-12-03 – 2023-12-05 (×3): 8.6 mg
  Filled 2023-12-03 (×3): qty 1

## 2023-12-03 MED ORDER — PROSOURCE TF20 ENFIT COMPATIBL EN LIQD
60.0000 mL | Freq: Four times a day (QID) | ENTERAL | Status: DC
  Administered 2023-12-03 – 2023-12-06 (×11): 60 mL
  Filled 2023-12-03 (×11): qty 60

## 2023-12-03 MED ORDER — CALCIUM GLUCONATE-NACL 2-0.675 GM/100ML-% IV SOLN
2.0000 g | Freq: Once | INTRAVENOUS | Status: AC
  Administered 2023-12-03: 2000 mg via INTRAVENOUS
  Filled 2023-12-03: qty 100

## 2023-12-03 MED ORDER — SODIUM CHLORIDE 0.9 % IV SOLN
1.0000 g | Freq: Three times a day (TID) | INTRAVENOUS | Status: AC
  Administered 2023-12-03 – 2023-12-09 (×21): 1 g via INTRAVENOUS
  Filled 2023-12-03 (×21): qty 20

## 2023-12-03 MED ORDER — ALBUMIN HUMAN 25 % IV SOLN
INTRAVENOUS | Status: AC
  Administered 2023-12-03: 25 g via INTRAVENOUS
  Filled 2023-12-03: qty 100

## 2023-12-03 MED ORDER — VANCOMYCIN HCL IN DEXTROSE 1-5 GM/200ML-% IV SOLN
1000.0000 mg | INTRAVENOUS | Status: AC
  Administered 2023-12-04 – 2023-12-09 (×6): 1000 mg via INTRAVENOUS
  Filled 2023-12-03 (×6): qty 200

## 2023-12-03 MED ORDER — AMIODARONE LOAD VIA INFUSION
150.0000 mg | Freq: Once | INTRAVENOUS | Status: AC
  Administered 2023-12-03: 150 mg via INTRAVENOUS
  Filled 2023-12-03: qty 83.34

## 2023-12-03 MED ORDER — HEPARIN SODIUM (PORCINE) 5000 UNIT/ML IJ SOLN
5000.0000 [IU] | Freq: Three times a day (TID) | INTRAMUSCULAR | Status: DC
Start: 2023-12-03 — End: 2023-12-05
  Administered 2023-12-03 – 2023-12-05 (×5): 5000 [IU] via SUBCUTANEOUS
  Filled 2023-12-03 (×5): qty 1

## 2023-12-03 MED ORDER — AMIODARONE LOAD VIA INFUSION
150.0000 mg | Freq: Once | INTRAVENOUS | Status: AC
  Administered 2023-12-03: 150 mg via INTRAVENOUS

## 2023-12-03 NOTE — Progress Notes (Addendum)
 Patient ID: Vanessa [name], female   DOB: 07-21-1951, 73 y.o.   MRN: [id number]     Advanced Heart Failure Rounding Note  Cardiologist: Dr. Rolan   Chief Complaint: Cardiogenic shock  Subjective:     POD#4 s/p MVR (tissue) and TV ring and same day return to OR for tamponade/bleeding. 1/2: Progressive volume overload with poor UOP, CVVH started but ended up re-intubated.    Currently on 0.375 milrinone , epinephrine  9, NE up to 37, vasopressin  0.04. Methylene blue  yesterday.  Hydrocortisone  100 q12.  Wide pulse pressure.   Started on vancomycin /meropenem .   She is in atrial fibrillation with controlled rate.  She had a wide complex idioventricular rhythm with rate 100s overnight.  Had amiodarone  bolus, on amiodarone  gtt 30 mg/hr.   Swan #s: CVP 19 PAP 88/25 CI 3.89 Co-ox 74%  CVVH ran even yesterday, now trying to pull net negative 100 cc/hr.   Awake on vent.   Limited echo 12/02/23: EF 40-45%, RV function reduced, small pericardial effusions w/ no evidence of tamponade but technically difficult study  Objective:   Weight Range: 99.1 kg Body mass index is 38.7 kg/m.   Vital Signs:   Temp:  [98.2 F (36.8 C)-99 F (37.2 C)] 98.6 F (37 C) (01/03 0700) Pulse Rate:  [60-88] 68 (01/03 0700) Resp:  [16-27] 26 (01/03 0700) BP: (100-156)/(36-116) 117/60 (01/03 0630) SpO2:  [88 %-100 %] 96 % (01/03 0700) Arterial Line BP: (93-164)/(36-123) 118/40 (01/03 0700) FiO2 (%):  [60 %-100 %] 70 % (01/03 0400) Weight:  [99.1 kg] 99.1 kg (01/03 0500) Last BM Date :  (PTA)  Weight change: Filed Weights   12/01/23 0500 12/02/23 0500 12/03/23 0500  Weight: 102.2 kg 97.1 kg 99.1 kg   Intake/Output:   Intake/Output Summary (Last 24 hours) at 12/03/2023 0713 Last data filed at 12/03/2023 0700 Gross per 24 hour  Intake 4017.63 ml  Output 3885.8 ml  Net 131.83 ml    Physical Exam   General: Awake on vent Neck: JVP 16+, no thyromegaly or thyroid  nodule.  Lungs: Clear to  auscultation bilaterally with normal respiratory effort. CV: Nondisplaced PMI.  Heart regular S1/S2, no S3/S4, no murmur.  No peripheral edema.  Abdomen: Soft, nontender, no hepatosplenomegaly, no distention.  Skin: Intact without lesions or rashes.  Neurologic: Follows commands.  Extremities: No clubbing or cyanosis.  HEENT: Normal.   Telemetry   Atrial fibrillation 70s (personally reviewed)  Labs    CBC Recent Labs    12/02/23 0329 12/02/23 1645 12/03/23 0319 12/03/23 0403  WBC 18.8*  --  19.1*  --   HGB 8.5*   < > 8.1* 8.8*  HCT 25.5*   < > 24.9* 26.0*  MCV 91.4  --  92.6  --   PLT 65*  --  66*  --    < > = values in this interval not displayed.   Basic Metabolic Panel Recent Labs    98/97/74 0329 12/02/23 1237 12/02/23 1528 12/02/23 1645 12/03/23 0319 12/03/23 0403  NA 133*   < > 133*   < > 131* 134*  K 4.6   < > 4.7   < > 4.1 4.1  CL 101   < > 102  --  100  --   CO2 22   < > 19*  --  22  --   GLUCOSE 128*   < > 132*  --  144*  --   BUN 40*   < > 46*  --  33*  --  CREATININE 2.54*   < > 2.76*  --  2.23*  --   CALCIUM  7.0*   < > 7.2*  --  7.8*  --   MG 2.8*  --   --   --  2.5*  --   PHOS  --   --  6.6*  --  4.2  --    < > = values in this interval not displayed.   Liver Function Tests Recent Labs    12/02/23 1528 12/03/23 0319  ALBUMIN  2.6* 3.1*   No results for input(s): LIPASE, AMYLASE in the last 72 hours. Cardiac Enzymes No results for input(s): CKTOTAL, CKMB, CKMBINDEX, TROPONINI in the last 72 hours.  BNP: BNP (last 3 results) Recent Labs    05/26/23 1610 08/26/23 1103 09/22/23 0905  BNP 778.0* 781.0* 926.0*   ProBNP (last 3 results) No results for input(s): PROBNP in the last 8760 hours.  D-Dimer No results for input(s): DDIMER in the last 72 hours. Hemoglobin A1C No results for input(s): HGBA1C in the last 72 hours. Fasting Lipid Panel No results for input(s): CHOL, HDL, LDLCALC, TRIG, CHOLHDL,  LDLDIRECT in the last 72 hours.  Thyroid  Function Tests No results for input(s): TSH, T4TOTAL, T3FREE, THYROIDAB in the last 72 hours.  Invalid input(s): FREET3  Other results:  Imaging   DG CHEST PORT 1 VIEW Result Date: 12/02/2023 CLINICAL DATA:  CHF intubated EXAM: PORTABLE CHEST 1 VIEW COMPARISON:  12/02/2023, 12/01/2023 FINDINGS: Interval intubation, tip of the endotracheal tube is at the carina. Esophageal tube tip in the left upper quadrant. Left-sided central venous catheter tip over the SVC. Right IJ Swan-Ganz catheter tip over the pulmonary artery confluence. Sternotomy changes. Similar cardiomegaly. Bilateral left greater than right pleural effusions and left basilar consolidation without significant change. IMPRESSION: 1. Interval intubation, tip of the endotracheal tube is at the carina. 2. Esophageal tube tip in the left upper quadrant of the abdomen 3. Otherwise no significant change in cardiomegaly, bilateral pleural effusions, and left basilar consolidation. Electronically Signed   By: Luke Bun M.D.   On: 12/02/2023 21:07   DG CHEST PORT 1 VIEW Result Date: 12/02/2023 CLINICAL DATA:  Catheter placement. EXAM: PORTABLE CHEST 1 VIEW COMPARISON:  Same day. FINDINGS: Interval placement of left internal jugular dialysis catheter with distal tip in expected position of the SVC. No pneumothorax is noted. IMPRESSION: Interval placement of left internal jugular dialysis catheter with distal tip in expected position of the SVC. Electronically Signed   By: Lynwood Landy Raddle M.D.   On: 12/02/2023 15:15   DG CHEST PORT 1 VIEW Result Date: 12/02/2023 CLINICAL DATA:  Congestive heart failure. EXAM: PORTABLE CHEST 1 VIEW COMPARISON:  Same day. FINDINGS: Stable cardiomegaly with mild central pulmonary vascular congestion. Status post cardiac valve repair. Right internal jugular Swan-Ganz catheter is noted with tip directed into left pulmonary artery. Small left pleural effusion is noted  with associated atelectasis. Bony thorax is unremarkable. IMPRESSION: Stable cardiomegaly with mild central pulmonary vascular congestion. Small left pleural effusion is noted with associated left basilar atelectasis. Electronically Signed   By: Lynwood Landy Raddle M.D.   On: 12/02/2023 15:14    Medications:     Scheduled Medications:  acetaminophen   1,000 mg Oral Q6H   Or   acetaminophen  (TYLENOL ) oral liquid 160 mg/5 mL  1,000 mg Per Tube Q6H   acetaminophen  (TYLENOL ) oral liquid 160 mg/5 mL  650 mg Per Tube Once   amiodarone   150 mg Intravenous Once   aspirin   EC  325 mg Oral Daily   Or   aspirin   324 mg Per Tube Daily   bisacodyl   10 mg Oral Daily   Or   bisacodyl   10 mg Rectal Daily   Chlorhexidine  Gluconate Cloth  6 each Topical Daily   docusate  100 mg Per Tube BID   enoxaparin  (LOVENOX ) injection  30 mg Subcutaneous QHS   hydrocortisone  sod succinate (SOLU-CORTEF ) inj  100 mg Intravenous Q12H   influenza vaccine adjuvanted  0.5 mL Intramuscular Tomorrow-1000   insulin  aspart  0-24 Units Subcutaneous Q4H   mouth rinse  15 mL Mouth Rinse Q2H   pantoprazole   40 mg Oral Daily   polyethylene glycol  17 g Per Tube Daily   sodium chloride  flush  3 mL Intravenous Q12H    Infusions:   prismasol  BGK 4/2.5 500 mL/hr at 12/03/23 0240    prismasol  BGK 4/2.5 300 mL/hr at 12/02/23 1547   albumin  human Stopped (12/03/23 0215)   amiodarone  30 mg/hr (12/03/23 0700)   epinephrine  9 mcg/min (12/03/23 0700)   fentaNYL  infusion INTRAVENOUS 150 mcg/hr (12/03/23 0700)   meropenem  (MERREM ) IV     milrinone  0.375 mcg/kg/min (12/03/23 0700)   norepinephrine  37 mcg/min (12/03/23 0700)   prismasol  BGK 4/2.5 1,000 mL/hr at 12/03/23 0240   [START ON 12/04/2023] vancomycin      vancomycin  200 mL/hr at 12/03/23 0700   vasopressin  0.04 Units/min (12/03/23 0700)    PRN Medications: artificial tears, fentaNYL , heparin , HYDROmorphone  (DILAUDID ) injection, levalbuterol , midazolam , ondansetron  (ZOFRAN ) IV,  mouth rinse, oxyCODONE , pneumococcal 20-valent conjugate vaccine, sodium chloride , sodium chloride  flush   Patient Profile   Vanessa [name] is a 73 y.o. with history of nonischemic cardiomyopathy and severe MR s/p Mitral Clip x2 (2020), pHTN, and CKD IIIb.   Assessment/Plan   1. Post Cardiotomy Cardiogenic/vasoplegic Shock: Bioprosthetic MVR and tricuspid ring 11/29/23 with Dr. Maryjane. Pre-op  cath with no significant CAD.  Pre-op  echo with EF 40-45%, severe MR.  Weaned from bypass to inotropic support intra-op. High sanguineous chest tube output (>1L) with cardiac compromise and increasing pressor requirement.  Went back to the OR with post-op tamponade for clean-out.  Echo 12/31 showed EF 25-30%, moderate RV dysfunction, s/p bioprosthetic mitral valve replacement with trivial MR and tricuspid valve repair with trivial TR.  No significant pericardial effusion. Limited echo 01/01 showed EF 40-45%, RV appeared reduced, small perciardial effusion w/ no evidence of tamponade. Worsened on 1/2 and re-intubated in setting of volume overload and AKI with poor UOP.  Currently on NE 37, Epi 9, Vaso 0.04, Milrinone  0.375. CI 3.89 with co-ox 74%, CVP 19 this morning.  Suspect component of septic shock/vasoplegia with high CO and shock.  Now on vancomycin /meropenem . Starting to pull fluid via CVVH, currently net 100 cc/hr.  - She needs volume off.  MAP has been limiting CVVH but has wide pulse pressure.  Would aim to keep SBP > 100.  Pull net negative 100 cc/hr goal today via CVVH.  - Would not be aggressive with pressor down-titration until we have removed some volume, do not want BP to limit volume removal.  - With high CO/?septic shock component, will start titrating down milrinone , 0.25 now and if tolerates will continue to come down.  Can use dobutamine if need additional inotrope in future.  - Discussed [facility] ECMO with Dr. Maryjane.  With high cardiac output/vasoplegic (?septic) shock and severely elevated PA  pressure, think probably not a good candidate.  2. Mitral regurgitation:  TEE in 11/19  with severe MR, restricted posterior leaflet.  She had Mitraclip x 2 in 1/20. Post-op echo showed mild MR and mild mitral stenosis.  Echo in 1/22 showed moderate MR, mild-moderate MS. Echo in 3/23 showed moderate MR, mild-moderate MS. Echo 9/23 showed mild-moderate MR, mild MS (mean gradient 7 mmHg).  Echo in 9/24 showed MV s/p Mitraclip x 2 with mean gradient 6 mmHg and severe mitral regurgitation. TEE was then done to evaluate, showing 2 Mitraclips in place with mean gradient 4 mmHg and severe eccentric MR from between the 2 clips with ERO 0.54 cm^2.  She was not thought to be a candidate for an additional Mitraclip due to lack of room between the two existing clips. R/LHC (11/24) showed moderate mixed pulmonary arterial and pulmonary venous hypertension likely due to mitral valve regurgitation. S/p MV replacement and Tricuspid repair on 11/29/23 with Dr. Maryjane - Valves stable on post-op echo.  3. Ventricular ectopy: H/o PVCs.  Had runs of what looks like an idioventricular rhythm rate 100s overnight.  - Continue Amio gtt 4. Pulmonary hypertension: Most recent RHC (11/24) with PVR 4.5, with moderate mixed pulmonary arterial and pulmonary venous hypertension likely due to mitral valve regurgitation. Now s/p MVR, PA systolic pressure in 80s.  Suspect still mixed PAH/PVH with elevated PCWP.  NO was tried at 10 ppm but did not have much effect so now weaning off.  - Wean off NO.  - Continue to pull fluid via CVVH as aggressively as she can tolerate.  5. Toxic multinodular goiter: She is now off methimazole . TSH was normal in 8/24. Follows with Endocrinology 6. AKI on CKD stage 3: Pre-op  creatinine 1.5, now on CVVH.  - Pull 100 cc/hr net negative goal today.  7. Anemia: Post-op blood loss.  Hgb 8.5 today, transfuse < 8.  8. Complete heart block: She had post-op CHB.  Now appears in AF.  9. Respiratory failure:  Intubated 1/2, suspect due to pulmonary edema but possible PNA.  - CCM following. 10. Atrial fibrillation: Patient appears to be in AF with controlled rate currently.  11. ID: Afebrile (Tm 99) but on CVVH.  WBCs up to 19.  Suspected component of septic shock (?PNA) with high cardiac output and hypotension.   - Continue vancomycin /meropenem .   CRITICAL CARE Performed by: Ezra Shuck  Total critical care time: 45 minutes  Critical care time was exclusive of separately billable procedures and treating other patients.  Critical care was necessary to treat or prevent imminent or life-threatening deterioration.  Critical care was time spent personally by me on the following activities: development of treatment plan with patient and/or surrogate as well as nursing, discussions with consultants, evaluation of patient's response to treatment, examination of patient, obtaining history from patient or surrogate, ordering and performing treatments and interventions, ordering and review of laboratory studies, ordering and review of radiographic studies, pulse oximetry and re-evaluation of patient's condition.  Ezra Shuck 12/03/2023 7:13 AM

## 2023-12-03 NOTE — Progress Notes (Signed)
 Nitric was weaned to off at 11:03 AM per heart failure's orders to wean nitric off. RN was made aware that nitric is now off. The nitric machine was left at the bedside on standby and inline to use if needed.

## 2023-12-03 NOTE — Progress Notes (Signed)
 ABG    Component Value Date/Time   PHART 7.26 (L) 12/03/2023 1617   PCO2ART 51 (H) 12/03/2023 1617   PO2ART 85 12/03/2023 1617   HCO3 22.9 12/03/2023 1617   TCO2 26 12/03/2023 0403   ACIDBASEDEF 4.6 (H) 12/03/2023 1617   O2SAT 98.9 12/03/2023 1617     RR increased 28> 30 I time droped 9.0> 0.8 Adding brovana  & yupelri . D/w RT.

## 2023-12-03 NOTE — Progress Notes (Signed)
 Pharmacy Antibiotic Note  Vanessa [name] is a 73 y.o. female admitted on 11/29/2023 for valve repair, now w/ concern for sepsis given pressor requirements and leukocytosis.  Pharmacy has been consulted for vancomycin  and meropenem  dosing.  Pt on CRRT.  Plan: Vancomycin  2000mg  x1 then 1000mg  IV every 24 hours.  Goal trough 15-20 mcg/mL. Meropenem  1g IV Q8H.  Height: 5' 3 (160 cm) Weight: 97.1 kg (214 lb 1.1 oz) IBW/kg (Calculated) : 52.4  Temp (24hrs), Avg:98.6 F (37 C), Min:98.2 F (36.8 C), Max:99.1 F (37.3 C)  Recent Labs  Lab 11/29/23 1358 11/29/23 1801 11/30/23 0417 11/30/23 0420 11/30/23 1447 11/30/23 1657 11/30/23 1710 11/30/23 2105 12/01/23 0359 12/01/23 1154 12/01/23 2131 12/02/23 0329 12/02/23 1237 12/02/23 1528 12/02/23 1653 12/03/23 0319  WBC  --    < >  --  10.7*  --  15.0*  --   --  16.6*  --   --  18.8*  --   --   --  19.1*  CREATININE  --    < >  --  1.51*   < >  --   --    < > 1.93*  --  2.38* 2.54* 2.72* 2.76*  --  2.23*  LATICACIDVEN 1.4  --  1.9  --   --   --  2.2*  --   --  1.3  --   --   --   --  1.0  --    < > = values in this interval not displayed.    Estimated Creatinine Clearance: 25.3 mL/min (A) (by C-G formula based on SCr of 2.23 mg/dL (H)).    No Known Allergies  Thank you for allowing pharmacy to be a part of this patient's care.  Marvetta Dauphin, PharmD, BCPS  12/03/2023 4:35 AM

## 2023-12-03 NOTE — Progress Notes (Signed)
 eLink Physician-Brief Progress Note Patient Name: Vanessa [name] DOB: [birth year] MRN: [id number]   Date of Service  12/03/2023  HPI/Events of Note  72yoF with PMH significant for HTN, NICM, HFrEF (EF40-45%), PHTN, CKD and hx of prior severe MR s/p prior mitral clip x 2 in 2020.  Had worsening mental status today in the setting of profound RV failure.  Intubated, on CRRT, nitric oxide, norepinephrine  at 40, epi at 11, and vasopressin  at 0.04.  MAP goal was 60-70 and the patient is sustaining in the low 60s.  Asked to review labs to see if any potential changes available.  eICU Interventions  Okay to titrate down FiO2 on the ventilator to maintain SpO2 greater than 92%  Replete calcium   No other immediate changes are available at this time.     Intervention Category Major Interventions: Hypoxemia - evaluation and management;Hypotension - evaluation and management  Alizabeth Antonio 12/03/2023, 12:37 AM

## 2023-12-03 NOTE — Progress Notes (Signed)
 NAME:  Vanessa [name], MRN:  [id number], DOB:  1951-08-08, LOS: 4 ADMISSION DATE:  11/29/2023, CONSULTATION DATE:  11/29/23 REFERRING MD:  Dr. Maryjane, CHIEF COMPLAINT:  postop   History of Present Illness:  72yoF with PMH significant for HTN, NICM, HFrEF (EF40-45%), PHTN, CKD and hx of prior severe MR s/p prior mitral clip x 2 in 2020.  Pt with progressive DOE found to have increasing MR  and moderate TR despite adjustments in GDMT.  Not felt to be candidate for additional mitral clip.  Repeat LHC without CAD but showed moderate PHTN.  Underwent mitral valve replacement with tissue valve and tricuspid valve repair by Dr. Maryjane on 12/30.  PCCM consulted to assist with vent and medical management post-operatively in ICU.   Pertinent  Medical History  Never smoker, MR w/ previous Mitral clip x 2 (2020), NICM, HFrEF, PHTN, HTN, CKD3b  Significant Hospital Events: Including procedures, antibiotic start and stop dates in addition to other pertinent events   MVR by Dr. Maryjane; relook for tamponade/washout later in evening without obvious source 1/2 intubated for respiratory decompensation, iNO started. Vanc, meropenem  started. CRRT started.   Interim History / Subjective:  No improvement on PA pressures on iNO. Now pulling with CRRT. On epi, milrinone , NE, vaso, amiodarone . Denies complaints.   CI 3.89 this morning.   Objective   Blood pressure 117/60, pulse 68, temperature 98.6 F (37 C), resp. rate (!) 26, height 5' 3 (1.6 m), weight 99.1 kg, SpO2 96%. PAP: (53-104)/(21-58) 90/27 CVP:  [0 mmHg-28 mmHg] 20 mmHg CO:  [5.4 L/min-6.6 L/min] 6.1 L/min CI:  [2.9 L/min/m2-3.6 L/min/m2] 3.3 L/min/m2  Vent Mode: PRVC FiO2 (%):  [60 %-100 %] 70 % Set Rate:  [26 bmp] 26 bmp Vt Set:  [420 mL] 420 mL PEEP:  [5 cmH20-6 cmH20] 5 cmH20 Pressure Support:  [6 cmH20] 6 cmH20 Plateau Pressure:  [20 cmH20-22 cmH20] 20 cmH20   Intake/Output Summary (Last 24 hours) at 12/03/2023 0713 Last data filed  at 12/03/2023 0700 Gross per 24 hour  Intake 4017.63 ml  Output 3885.8 ml  Net 131.83 ml   Filed Weights   12/01/23 0500 12/02/23 0500 12/03/23 0500  Weight: 102.2 kg 97.1 kg 99.1 kg   Examination: Critically ill appearing woman lying in bed in NAD [place]/AT, ETT in place Breathing comfortably on MV, Pplat 26 after PEEP increased to 10. Minimal secretions from ETT.  S1-S2, regular rhythm, prominent precordial heave.  Flipping in and out of a flutter during exam. Abdomen soft, nontender Edema No rashes or wounds Awake, tracks and follows commands, slow to respond.   WBC 19.1 H/H 8.1/24.9 Platelets 66 7.26/53/65/24 Coox 73%   Resolved Hospital Problem list :   Postoperative vent management  Assessment & Plan:  Severe  MR (s/p prior mitral clip x 2 in 2020) now s/p tissue MVR Moderate TR s/p TV repair  Moderate PHTN> progressed to severe postop NICM, HFrEF, EF 40-45% Post bypass vasoplegia and cardioplegia  Postop tamponade resolved after relook and washout POD #0 Paroxysmal Aflutter -Postop care per cardiothoracic surgery - Decrease milrinone  per cardiology to 0.25 mics - Continue epi, norepi, vasopressin  to maintain MAP greater than 65.  Continue pressors to facilitate volume removal with CRRT. - Decreasing iNO due to lack of response to PA pressures - Optimize vent to improve PA pressures - Increase amiodarone  to 60 mg to help maintain sinus rhythm - Monitor electrolytes and replete as needed  Mixed cardiogenic and distributive shock - Previously given  methylene blue  on 1/2  Acute pulmonary edema Acute respiratory failure with hypoxia requiring mechanical ventilation - LTVV - VAP prevention protocol - PAD protocol for sedation, goal RASS 0 to -1.  Sedation is appropriate. - Daily SAT and SBT as appropriate.  Needs to be on less FiO2 before this is feasible. - PEEP increased and FiO2 decreased.  Neutral to beneficial on PA pressures when this was done during  exam. -Steroids  Expected post-operative ABLA -Transfuse for hemoglobin less than 7 or hemodynamically significant bleeding  AKI on CKD3b now query cardiorenal vs. Atn from initial hemodynamic insults -CRRT, pulling 100 cc/h - Renally dose meds - Maintain adequate renal perfusion - If renal output slows we will discontinue Foley today and do bladder scans  Possible infection; no identified source -Continue vancomycin  and meropenem . - Collect respiratory culture if she begins having sputum production  At risk for malnutrition - Start tube feeds, trickle only due to vasopressor requirements  Constipation -miralax  increase to BID, add senna daily -suppository today  Best Practice (right click and Reselect all SmartList Selections daily)   Diet/type: TF DVT prophylaxis [place] heparin  Pressure ulcer(s): None GI prophylaxis: PPI Lines: Central line, Dialysis Catheter, and Arterial Line Foley:  Yes, and it is still needed Code Status:  full code Last date of multidisciplinary goals of care discussion [per primary]  This patient is critically ill with multiple organ system failure which requires frequent high complexity decision making, assessment, support, evaluation, and titration of therapies. This was completed through the application of advanced monitoring technologies and extensive interpretation of multiple databases. During this encounter critical care time was devoted to patient care services described in this note for 39 minutes.  [name], DO 12/03/23 7:56 AM [facility]  For contact information, see Amion. If no response to pager, please call PCCM consult pager. After hours, 7PM- 7AM, please call Elink.

## 2023-12-03 NOTE — Procedures (Signed)
 Cortrak  Person Inserting Tube:  Rosabel Rollo PARAS, RD Tube Type:  Cortrak - 43 inches Tube Size:  10 Tube Location:  Right nare Secured by: Bridle Technique Used to Measure Tube Placement:  Marking at nare/corner of mouth Cortrak Secured At:  69 cm   Cortrak Tube Team Note:  Consult received to place a Cortrak feeding tube.   X-ray is required, abdominal x-ray has been ordered by the Cortrak team. Please confirm tube placement before using the Cortrak tube.   If the tube becomes dislodged please keep the tube and contact the Cortrak team at [email or web address] for replacement.  If after hours and replacement cannot be delayed, place a NG tube and confirm placement with an abdominal x-ray.    Mallie Rosabel, MS, RD, LDN Registered Dietitian II Please see AMiON for contact information.

## 2023-12-03 NOTE — Progress Notes (Signed)
 [address]       [place] [address]             [phone]      4 Days Post-Op  Procedure(s) (LRB): EXPLORATION POST OPERATIVE OPEN HEART (N/A)   Total Length of Stay:  LOS: 4 days    SUBJECTIVE: Pt has been having more ectopy and has had increasing inotropes over the night   Vitals:   12/03/23 0330 12/03/23 0345  BP: (!) 136/55   Pulse: 71 66  Resp: (!) 26 (!) 26  Temp: 99 F (37.2 C) 99 F (37.2 C)  SpO2: 93% 94%    Intake/Output      01/02 0701 01/03 0700   I.V. (mL/kg) 2762.9 (28.5)   NG/GT 80   IV Piggyback 396.7   Total Intake(mL/kg) 3239.6 (33.4)   Urine (mL/kg/hr) 657 (0.3)   CRRT 2791.6   Total Output 3448.6   Net -209            prismasol  BGK 4/2.5 500 mL/hr at 12/03/23 0240    prismasol  BGK 4/2.5 300 mL/hr at 12/02/23 1547   albumin  human Stopped (12/03/23 0215)   amiodarone  30 mg/hr (12/03/23 0400)   epinephrine  17 mcg/min (12/03/23 0400)   fentaNYL  infusion INTRAVENOUS 150 mcg/hr (12/03/23 0400)   magnesium  sulfate bolus IVPB     milrinone  0.375 mcg/kg/min (12/03/23 0400)   norepinephrine  40 mcg/min (12/03/23 0400)   prismasol  BGK 4/2.5 1,000 mL/hr at 12/03/23 0240   vasopressin  0.04 Units/min (12/03/23 0400)    CBC    Component Value Date/Time   WBC 19.1 (H) 12/03/2023 0319   RBC 2.69 (L) 12/03/2023 0319   HGB 8.8 (L) 12/03/2023 0403   HCT 26.0 (L) 12/03/2023 0403   PLT 66 (L) 12/03/2023 0319   MCV 92.6 12/03/2023 0319   MCH 30.1 12/03/2023 0319   MCHC 32.5 12/03/2023 0319   RDW 15.6 (H) 12/03/2023 0319   LYMPHSABS 1.1 07/24/2022 1528   MONOABS 0.6 07/24/2022 1528   EOSABS 0.0 07/24/2022 1528   BASOSABS 0.0 07/24/2022 1528   CMP     Component Value Date/Time   NA 134 (L) 12/03/2023 0403   K 4.1 12/03/2023 0403   CL 100 12/03/2023 0319   CO2 22 12/03/2023 0319   GLUCOSE 144 (H) 12/03/2023 0319   BUN 33 (H) 12/03/2023 0319   CREATININE 2.23 (H) 12/03/2023 0319   CREATININE 1.30 (H) 04/29/2016  1144   CALCIUM  7.8 (L) 12/03/2023 0319   PROT 4.6 (L) 11/30/2023 0420   ALBUMIN  3.1 (L) 12/03/2023 0319   AST 57 (H) 11/30/2023 0420   ALT 13 11/30/2023 0420   ALKPHOS 24 (L) 11/30/2023 0420   BILITOT 7.2 (H) 11/30/2023 0420   GFRNONAA 23 (L) 12/03/2023 0319   GFRAA 42 (L) 08/19/2020 1304   ABG    Component Value Date/Time   PHART 7.268 (L) 12/03/2023 0403   PCO2ART 53.1 (H) 12/03/2023 0403   PO2ART 65 (L) 12/03/2023 0403   HCO3 24.2 12/03/2023 0403   TCO2 26 12/03/2023 0403   ACIDBASEDEF 3.0 (H) 12/03/2023 0403   O2SAT 89 12/03/2023 0403   CBG (last 3)  Recent Labs    12/02/23 2043 12/03/23 0012 12/03/23 0401  GLUCAP 144* 125* 135*  EXAM Sedated but awakens to commands Lungs: rhonchorus  Card: RR Ext: warm   ASSESSMENT: POD #4 sp MV replacement and TV repair with reexploration for tamponade Hemodynamics appears vasoplegic with CI above 3 on very  high doses of levo and epi, vaso and milrinone  with slightly acidotic ABG and hypercarbic. Cxr with ongoing large heart silloette. Lung fields appear congested. Had what was felt to be Vtach but to me appears supraventricular with abberancy.  Will need to discuss with AHF possible escalation to support to possibly [facility] ECMO due to high inotropic support and high ventilator settings. Consider TEE this am to reevaluate heart function Renal failure. Post op secondary to vasoplegic shock. On CRRT. Had to turn down removal secondary to bp  Thrombocytopenia continues, stable    Deward Kallman, MD 12/03/2023

## 2023-12-03 NOTE — Progress Notes (Signed)
 [facility] Progress Note   Assessment/ Plan:   Vanessa [name] is a/an 73 y.o. female with a past medical history HTN, NICM, CHF, pulmonary hypertension, mitral and tricuspid valve disease  who present w/ surgery for valvular disease c/b shock, AHRF, and AKI    Oliguric AKI on CKD 3A: Mild baseline CKD likely multifactorial.  Now with AKI likely ATN and cardiorenal syndrome in the setting of shock.  No obvious uremic symptoms but nearing the need for intubation with worsening pulmonary edema and volume overload.  Therefore we will start CRRT but continue with diuresis - continue CRRT- all 4k, max UF -Dialysis catheter placed by CCM, appreciate help -Continue Lasix  infusion and consider uptitrating, consider additional metolazone  -Continue to monitor daily Cr, Dose meds for GFR -Monitor Daily I/Os, Daily weight  -Maintain MAP>65 for optimal renal perfusion.  -Avoid nephrotoxic medications including NSAIDs -Use synthetic opioids (Fentanyl /Dilaudid ) if needed   Multifactorial shock: Vasoplegic and cardioplegia catheter surgery.  On pressors and inotropes.  Continue management per primary team   Acute exacerbation of heart failure with reduced ejection fraction: Volume overload.  Worsening pulmonary edema.  Diuretics and CRT as above.   Severe MR and tricuspid valvular disease: Status post repair.  CT surgery managing   Postoperative cardiac tamponade: Continue monitoring per cardiology   Hyponatremia: In the setting of AKI.  Continue to monitor   Anemia: Related to blood loss predominantly.  Transfusions per primary team  Subjective:    Seen in room.  Awake.  On CRRT, aggressive diuresis.  PA pressures still quite high, CVP 17-18.   Objective:   BP (!) 129/57   Pulse 67   Temp 99.1 F (37.3 C)   Resp (!) 23   Ht 5' 3 (1.6 m)   Wt 99.1 kg   SpO2 93%   BMI 38.70 kg/m   Intake/Output Summary (Last 24 hours) at 12/03/2023 0934 Last data filed at 12/03/2023  0900 Gross per 24 hour  Intake 4291.12 ml  Output 4686.3 ml  Net -395.18 ml   Weight change: 2 kg  Physical Exam: Gen:ill-appearing CVS: tachycardic Resp: intubated, mech bilaterally Abd: soft Ext: 2+ LE edema  Imaging: DG CHEST PORT 1 VIEW Result Date: 12/03/2023 CLINICAL DATA:  Pleural effusions. EXAM: PORTABLE CHEST 1 VIEW COMPARISON:  Radiograph 12/02/2023 FINDINGS: Endotracheal tube, NG tube Swan-Ganz catheter and LEFT central venous line are unchanged. Stable enlarged cardiac silhouette. Bilateral pleural effusions. Central venous congestion. No pneumothorax. IMPRESSION: 1. No significant change.  Stable support apparatus 2. Bilateral pleural effusions and central venous congestion. Electronically Signed   By: Jackquline Boxer M.D.   On: 12/03/2023 09:30   DG CHEST PORT 1 VIEW Result Date: 12/02/2023 CLINICAL DATA:  CHF intubated EXAM: PORTABLE CHEST 1 VIEW COMPARISON:  12/02/2023, 12/01/2023 FINDINGS: Interval intubation, tip of the endotracheal tube is at the carina. Esophageal tube tip in the left upper quadrant. Left-sided central venous catheter tip over the SVC. Right IJ Swan-Ganz catheter tip over the pulmonary artery confluence. Sternotomy changes. Similar cardiomegaly. Bilateral left greater than right pleural effusions and left basilar consolidation without significant change. IMPRESSION: 1. Interval intubation, tip of the endotracheal tube is at the carina. 2. Esophageal tube tip in the left upper quadrant of the abdomen 3. Otherwise no significant change in cardiomegaly, bilateral pleural effusions, and left basilar consolidation. Electronically Signed   By: Luke Bun M.D.   On: 12/02/2023 21:07   DG CHEST PORT 1 VIEW Result Date: 12/02/2023 CLINICAL DATA:  Catheter placement. EXAM: PORTABLE CHEST 1 VIEW COMPARISON:  Same day. FINDINGS: Interval placement of left internal jugular dialysis catheter with distal tip in expected position of the SVC. No pneumothorax is noted.  IMPRESSION: Interval placement of left internal jugular dialysis catheter with distal tip in expected position of the SVC. Electronically Signed   By: Lynwood Landy Raddle M.D.   On: 12/02/2023 15:15   DG CHEST PORT 1 VIEW Result Date: 12/02/2023 CLINICAL DATA:  Congestive heart failure. EXAM: PORTABLE CHEST 1 VIEW COMPARISON:  Same day. FINDINGS: Stable cardiomegaly with mild central pulmonary vascular congestion. Status post cardiac valve repair. Right internal jugular Swan-Ganz catheter is noted with tip directed into left pulmonary artery. Small left pleural effusion is noted with associated atelectasis. Bony thorax is unremarkable. IMPRESSION: Stable cardiomegaly with mild central pulmonary vascular congestion. Small left pleural effusion is noted with associated left basilar atelectasis. Electronically Signed   By: Lynwood Landy Raddle M.D.   On: 12/02/2023 15:14   DG Chest Port 1 View Result Date: 12/02/2023 CLINICAL DATA:  Shortness of breath EXAM: PORTABLE CHEST 1 VIEW COMPARISON:  12/01/2023 FINDINGS: Interval placement of Swan-Ganz catheter with tip of the proximal right pulmonary artery. No pneumothorax. Stable mediastinal drains. Sternal wires. Overlapping cardiac leads. Enlarged cardiopericardial silhouette with increasing vascular congestion. Moderate left effusion with the adjacent opacity. No pneumothorax. IMPRESSION: Placement of the Swan-Ganz catheter with the tip overlying the right pulmonary artery. No pneumothorax. Increasing vascular congestion Electronically Signed   By: Ranell Bring M.D.   On: 12/02/2023 09:40   DG Chest Port 1 View Result Date: 12/01/2023 CLINICAL DATA:  Shortness of breath.  Post mitral valve replacement. EXAM: PORTABLE CHEST 1 VIEW COMPARISON:  Earlier same day; 11/30/2023; 11/29/2023 FINDINGS: Grossly unchanged enlarged cardiac silhouette and mediastinal contours given differences in patient rotation. There is persistent rightward deviation of the tracheal air column at the  level of the thoracic inlet. Interval retraction of pulmonary arterial central venous catheter with tip now projected over the right atrium. Sequela of previous median sternotomy and valve replacements. Redemonstrated mediastinal drains. No pneumothorax. Pulmonary vasculature remains indistinct with cephalization of flow. Unchanged trace right and small to moderate-sized left-sided effusions with associated bibasilar heterogeneous/consolidative opacities, left-greater-than-right. No acute osseous abnormalities. IMPRESSION: 1. Interval retraction of pulmonary arterial central venous catheter with tip now projected over the right atrium. 2. Otherwise, no significant interval change in the appearance of the chest including pulmonary edema, trace right and small to moderate-sized left-sided effusions with associated bibasilar atelectasis, left-greater-than-right. Electronically Signed   By: Norleen Roulette M.D.   On: 12/01/2023 16:28   ECHOCARDIOGRAM LIMITED Result Date: 12/01/2023    ECHOCARDIOGRAM LIMITED REPORT   Patient Name:   Vanessa [name] Date of Exam: 12/01/2023 Medical Rec #:  [id number]        Height:       63.0 in Accession #:    [id number]       Weight:       225.3 lb Date of Birth:  [birth year]         BSA:          2.034 m Patient Age:    72 years         BP:           112/45 mmHg Patient Gender: F                HR:           79 bpm. Exam Location:  Inpatient Procedure: Limited Echo, Color Doppler and Cardiac Doppler Indications:    I31.3 Pericardial effusion (noninflammatory)  History:        Patient has prior history of Echocardiogram examinations, most                 recent 11/30/2023. CHF, Pulmonary HTN; Risk                 Factors:Hypertension.                  Mitral Valve: 31 mm Medtronic bioprosthetic valve valve is                 present in the mitral position.  Sonographer:    Damien Senior RDCS Referring Phys: [phone] EZRA RAMAN [facility]  Sonographer Comments: Limited for effusion check IMPRESSIONS  1.  Small pericardial effusion which appears anterior to the RV and posterior to the basal aspect of the LV. Difficult study on images obtained. RV appears dilated with reduced function but not well seen. IVC is dilated and does collapse >50%. Effusion appears similar to the study from the day prior. No evidence of diastolic RV collapse on this limited study. No overt features to suggest cardiac tamponade but limited study with difficult windows for TTE. a small pericardial effusion is present. The pericardial effusion is anterior to the right ventricle and posterior to the left ventricle.  2. Left ventricular ejection fraction, by estimation, is 40 to 45%. The left ventricle has mildly decreased function. There is the interventricular septum is flattened in systole and diastole, consistent with right ventricular pressure and volume overload.  3. Right ventricular systolic function was not well visualized. The right ventricular size is not well visualized.  4. There is a 31 mm Medtronic bioprosthetic valve present in the mitral position.  5. The tricuspid valve is has been repaired/replaced. The tricuspid valve is status post repair with an annuloplasty ring.  6. The aortic valve is tricuspid.  7. The inferior vena cava is dilated in size with <50% respiratory variability, suggesting right atrial pressure of 15 mmHg. Comparison(s): Changes from prior study are noted. Effusion appears similar to the prior study. LVEF has improved 40-45%. FINDINGS  Left Ventricle: Left ventricular ejection fraction, by estimation, is 40 to 45%. The left ventricle has mildly decreased function. The interventricular septum is flattened in systole and diastole, consistent with right ventricular pressure and volume overload. Right Ventricle: The right ventricular size is not well visualized. Right vetricular wall thickness was not well visualized. Right ventricular systolic function was not well visualized. Pericardium: Small pericardial  effusion which appears anterior to the RV and posterior to the basal aspect of the LV. Difficult study on images obtained. RV appears dilated with reduced function but not well seen. IVC is dilated and does collapse >50%. Effusion appears similar to the study from the day prior. No evidence of diastolic RV collapse on this limited study. No overt features to suggest cardiac tamponade but limited study with difficult windows for TTE. A small pericardial effusion is present. The pericardial effusion is anterior to the right ventricle and posterior to the left ventricle. Mitral Valve: There is a 31 mm Medtronic bioprosthetic valve present in the mitral position. Tricuspid Valve: The tricuspid valve is has been repaired/replaced. The tricuspid valve is status post repair with an annuloplasty ring. Aortic Valve: The aortic valve is tricuspid. Pulmonic Valve: The pulmonic valve was grossly normal. Venous: The inferior vena cava is dilated in size with  less than 50% respiratory variability, suggesting right atrial pressure of 15 mmHg. Additional Comments: A venous catheter is visualized in the right ventricle. Spectral Doppler performed. Color Doppler performed.  Darryle Decent MD Electronically signed by Darryle Decent MD Signature Date/Time: 12/01/2023/2:59:38 PM    Final     Labs: BMET Recent Labs  Lab 11/30/23 2105 12/01/23 9640 12/01/23 2131 12/02/23 0329 12/02/23 1237 12/02/23 1528 12/02/23 1645 12/02/23 1951 12/03/23 0012 12/03/23 0319 12/03/23 0403  NA 135 135 132* 133* 131* 133* 133* 133* 134* 131* 134*  K 4.4 4.3 4.5 4.6 4.6 4.7 4.7 4.5 4.5 4.1 4.1  CL 106 106 101 101 97* 102  --   --   --  100  --   CO2 22 21* 21* 22 21* 19*  --   --   --  22  --   GLUCOSE 162* 153* 146* 128* 137* 132*  --   --   --  144*  --   BUN 34* 34* 41* 40* 43* 46*  --   --   --  33*  --   CREATININE 1.86* 1.93* 2.38* 2.54* 2.72* 2.76*  --   --   --  2.23*  --   CALCIUM  7.2* 7.1* 6.9* 7.0* 7.1* 7.2*  --   --   --   7.8*  --   PHOS  --   --   --   --   --  6.6*  --   --   --  4.2  --    CBC Recent Labs  Lab 11/30/23 1657 11/30/23 1742 12/01/23 0359 12/02/23 0329 12/02/23 1645 12/02/23 1951 12/03/23 0012 12/03/23 0319 12/03/23 0403  WBC 15.0*  --  16.6* 18.8*  --   --   --  19.1*  --   HGB 9.2*   < > 8.8* 8.5*   < > 9.2* 8.8* 8.1* 8.8*  HCT 26.7*   < > 25.5* 25.5*   < > 27.0* 26.0* 24.9* 26.0*  MCV 88.1  --  88.5 91.4  --   --   --  92.6  --   PLT 91*  --  78* 65*  --   --   --  66*  --    < > = values in this interval not displayed.    Medications:     acetaminophen   1,000 mg Oral Q6H   Or   acetaminophen  (TYLENOL ) oral liquid 160 mg/5 mL  1,000 mg Per Tube Q6H   acetaminophen  (TYLENOL ) oral liquid 160 mg/5 mL  650 mg Per Tube Once   aspirin  EC  325 mg Oral Daily   Or   aspirin   324 mg Per Tube Daily   bisacodyl   10 mg Oral Daily   Or   bisacodyl   10 mg Rectal Daily   Chlorhexidine  Gluconate Cloth  6 each Topical Daily   docusate  100 mg Per Tube BID   feeding supplement (PROSource TF20)  60 mL Per Tube Daily   heparin  injection (subcutaneous)  5,000 Units Subcutaneous Q8H   hydrocortisone  sod succinate (SOLU-CORTEF ) inj  100 mg Intravenous Q12H   influenza vaccine adjuvanted  0.5 mL Intramuscular Tomorrow-1000   insulin  aspart  0-24 Units Subcutaneous Q4H   mouth rinse  15 mL Mouth Rinse Q2H   pantoprazole  (PROTONIX ) IV  40 mg Intravenous Q24H   polyethylene glycol  17 g Per Tube BID   senna  1 tablet Per Tube Daily   sodium chloride  flush  3 mL Intravenous Q12H    Almarie Bonine MD 12/03/2023, 9:34 AM

## 2023-12-03 NOTE — Progress Notes (Signed)
 Initial Nutrition Assessment  DOCUMENTATION CODES:   Not applicable  INTERVENTION:   Tube Feeding via Cortrak:  Vital AF 1.2 at 20 ml/hr today Goal TF: Vital AF 1.2 at 55 ml/hr TF at goal rate provides 1584 kcals, 99 g of protein and 1069 mL of free water   Add Renal MVI  Pro-Source TF20 60 mL QID while on trickle TF, each packet contains 20 g of protein and 80 kcals   NUTRITION DIAGNOSIS:   Inadequate oral intake related to acute illness as evidenced by NPO status.  GOAL:   Patient will meet greater than or equal to 90% of their needs  MONITOR:   Vent status, TF tolerance, Labs, Weight trends  REASON FOR ASSESSMENT:   Consult Enteral/tube feeding initiation and management, Assessment of nutrition requirement/status (CRRT)  ASSESSMENT:   73 yo admitted with severe MR/TR requiring MVR/TVR complicated by post op bleeding and shock. Pt intubated and CRRT initiated. PMH includes HTN, NICM, HFrEF (EF40-50%), HTN, CKD and hx of prior severe MR s/p mitral clip x 2 in 2020.  12/30 Admitted, severe MR, moderate TR, OR for MVR with mosaic porcine tissue prosthesis, TVR, post op cardiogenic shock, post op bleeding requiring return to OR for washout 12/31 Extubated 01/02 Re-Intubated, iNO started, CRRT initiated 01/03 Cortrak, trickle TF  Pt remains on vent support. Remains on significant pressor support, noted pt received methylene blue  yesterday.  Levophed  at 35 Epinephrine  at 9 Vasopressin  at 0.04 Remains on milrinone  and amiodarone  gtt, lasix  gtt off  Pt's nephew and nephew's wife at bedside. Per family, pt typically eats breakfast and then does not eat again until she gets home from work. Pt works at a daycare currently. No weight loss per family but they also acknowledge pt with fluid gains. Family also indicates a few days prior to admission, pt had dinner prepared by her friend, pt usually loves eating her friend's food but did not have a taste for it and did not eat much.   Pt on a po diet while in hospital but unclear how much, if any, pt ate. Noted one meal recorded at 10%  Phosphorus has trended down to normal with CRRT initiation, monitor trend as hypophosphatemia likely  Abdomen obese but soft, BS present, no flatus, no BM. Noted MD adjusting bowel regimen Cortrak placed and tip in distal stomach  +edema on exam, current dry weight unknown. Current wt 99.1 kg  Labs: lactic acid 1.1 (wdl), sodium 134 (L), potassium 4.1 (wdl) Phosphorus 4.2 (wdl), magnesium  2.5 (H) Meds: colace, solucortef, miralax , senna  NUTRITION - FOCUSED PHYSICAL EXAM:  Flowsheet Row Most Recent Value  Orbital Region No depletion  Upper Arm Region Unable to assess  Thoracic and Lumbar Region No depletion  Buccal Region Unable to assess  Temple Region No depletion  Clavicle Bone Region No depletion  Clavicle and Acromion Bone Region No depletion  Scapular Bone Region No depletion  Dorsal Hand No depletion  Patellar Region Unable to assess  Anterior Thigh Region Unable to assess  Posterior Calf Region Unable to assess  Edema (RD Assessment) Mild       Diet Order:   Diet Order             Diet NPO time specified  Diet effective now                   EDUCATION NEEDS:   Not appropriate for education at this time  Skin:  Skin Assessment: Skin Integrity Issues: Skin Integrity  Issues:: Incisions Incisions: chest post sternotomy (closed)  Last BM:  PTA, abdomen soft, BS present  Height:   Ht Readings from Last 1 Encounters:  12/02/23 5' 3 (1.6 m)    Weight:   Wt Readings from Last 1 Encounters:  12/03/23 99.1 kg    BMI:  Body mass index is 38.7 kg/m.  Estimated Nutritional Needs:   Kcal:  1500-1700 kcals  Protein:  90-115 g  Fluid:  1.5 L    Betsey Finger MS, RDN, LDN, CNSC Registered Dietitian 3 Clinical Nutrition RD Inpatient Contact Info in Amion

## 2023-12-03 NOTE — Plan of Care (Signed)
  Problem: Education: Goal: Knowledge of General Education information will improve Description: Including pain rating scale, medication(s)/side effects and non-pharmacologic comfort measures Outcome: Progressing   Problem: Health Behavior/Discharge Planning: Goal: Ability to manage health-related needs will improve Outcome: Progressing   Problem: Nutrition: Goal: Adequate nutrition will be maintained Outcome: Progressing   Problem: Coping: Goal: Level of anxiety will decrease Outcome: Progressing   Problem: Elimination: Goal: Will not experience complications related to bowel motility Outcome: Progressing Goal: Will not experience complications related to urinary retention Outcome: Progressing   Problem: Pain Management: Goal: General experience of comfort will improve Outcome: Progressing   Problem: Safety: Goal: Ability to remain free from injury will improve Outcome: Progressing   Problem: Skin Integrity: Goal: Risk for impaired skin integrity will decrease Outcome: Progressing

## 2023-12-04 ENCOUNTER — Inpatient Hospital Stay (HOSPITAL_COMMUNITY): Payer: PPO

## 2023-12-04 DIAGNOSIS — R6521 Severe sepsis with septic shock: Secondary | ICD-10-CM

## 2023-12-04 DIAGNOSIS — J9601 Acute respiratory failure with hypoxia: Secondary | ICD-10-CM | POA: Diagnosis not present

## 2023-12-04 DIAGNOSIS — Z952 Presence of prosthetic heart valve: Secondary | ICD-10-CM | POA: Diagnosis not present

## 2023-12-04 DIAGNOSIS — A419 Sepsis, unspecified organism: Secondary | ICD-10-CM | POA: Diagnosis not present

## 2023-12-04 DIAGNOSIS — I959 Hypotension, unspecified: Secondary | ICD-10-CM | POA: Diagnosis not present

## 2023-12-04 DIAGNOSIS — T8111XA Postprocedural  cardiogenic shock, initial encounter: Secondary | ICD-10-CM | POA: Diagnosis not present

## 2023-12-04 DIAGNOSIS — J9602 Acute respiratory failure with hypercapnia: Secondary | ICD-10-CM | POA: Diagnosis not present

## 2023-12-04 LAB — POCT I-STAT 7, (LYTES, BLD GAS, ICA,H+H)
Acid-base deficit: 5 mmol/L — ABNORMAL HIGH (ref 0.0–2.0)
Bicarbonate: 21.6 mmol/L (ref 20.0–28.0)
Calcium, Ion: 1.07 mmol/L — ABNORMAL LOW (ref 1.15–1.40)
HCT: 24 % — ABNORMAL LOW (ref 36.0–46.0)
Hemoglobin: 8.2 g/dL — ABNORMAL LOW (ref 12.0–15.0)
O2 Saturation: 96 %
Patient temperature: 37.3
Potassium: 3.5 mmol/L (ref 3.5–5.1)
Sodium: 137 mmol/L (ref 135–145)
TCO2: 23 mmol/L (ref 22–32)
pCO2 arterial: 45.4 mm[Hg] (ref 32–48)
pH, Arterial: 7.287 — ABNORMAL LOW (ref 7.35–7.45)
pO2, Arterial: 90 mm[Hg] (ref 83–108)

## 2023-12-04 LAB — CBC
HCT: 23.7 % — ABNORMAL LOW (ref 36.0–46.0)
Hemoglobin: 7.7 g/dL — ABNORMAL LOW (ref 12.0–15.0)
MCH: 30 pg (ref 26.0–34.0)
MCHC: 32.5 g/dL (ref 30.0–36.0)
MCV: 92.2 fL (ref 80.0–100.0)
Platelets: 75 10*3/uL — ABNORMAL LOW (ref 150–400)
RBC: 2.57 MIL/uL — ABNORMAL LOW (ref 3.87–5.11)
RDW: 15.9 % — ABNORMAL HIGH (ref 11.5–15.5)
WBC: 15.1 10*3/uL — ABNORMAL HIGH (ref 4.0–10.5)
nRBC: 0.9 % — ABNORMAL HIGH (ref 0.0–0.2)

## 2023-12-04 LAB — RENAL FUNCTION PANEL
Albumin: 3.4 g/dL — ABNORMAL LOW (ref 3.5–5.0)
Albumin: 3.5 g/dL (ref 3.5–5.0)
Anion gap: 10 (ref 5–15)
Anion gap: 13 (ref 5–15)
BUN: 31 mg/dL — ABNORMAL HIGH (ref 8–23)
BUN: 35 mg/dL — ABNORMAL HIGH (ref 8–23)
CO2: 22 mmol/L (ref 22–32)
CO2: 21 mmol/L — ABNORMAL LOW (ref 22–32)
Calcium: 8.1 mg/dL — ABNORMAL LOW (ref 8.9–10.3)
Calcium: 8.9 mg/dL (ref 8.9–10.3)
Chloride: 102 mmol/L (ref 98–111)
Chloride: 101 mmol/L (ref 98–111)
Creatinine, Ser: 1.86 mg/dL — ABNORMAL HIGH (ref 0.44–1.00)
Creatinine, Ser: 1.76 mg/dL — ABNORMAL HIGH (ref 0.44–1.00)
GFR, Estimated: 28 mL/min — ABNORMAL LOW (ref >60)
GFR, Estimated: 30 mL/min — ABNORMAL LOW (ref >60)
Glucose, Bld: 172 mg/dL — ABNORMAL HIGH (ref 70–99)
Glucose, Bld: 168 mg/dL — ABNORMAL HIGH (ref 70–99)
Phosphorus: 3.3 mg/dL (ref 2.5–4.6)
Phosphorus: 2.6 mg/dL (ref 2.5–4.6)
Potassium: 3.8 mmol/L (ref 3.5–5.1)
Potassium: 3.6 mmol/L (ref 3.5–5.1)
Sodium: 134 mmol/L — ABNORMAL LOW (ref 135–145)
Sodium: 135 mmol/L (ref 135–145)

## 2023-12-04 LAB — GLUCOSE, CAPILLARY
Glucose-Capillary: 110 mg/dL — ABNORMAL HIGH (ref 70–99)
Glucose-Capillary: 116 mg/dL — ABNORMAL HIGH (ref 70–99)
Glucose-Capillary: 137 mg/dL — ABNORMAL HIGH (ref 70–99)
Glucose-Capillary: 139 mg/dL — ABNORMAL HIGH (ref 70–99)
Glucose-Capillary: 153 mg/dL — ABNORMAL HIGH (ref 70–99)
Glucose-Capillary: 160 mg/dL — ABNORMAL HIGH (ref 70–99)
Glucose-Capillary: 161 mg/dL — ABNORMAL HIGH (ref 70–99)
Glucose-Capillary: 163 mg/dL — ABNORMAL HIGH (ref 70–99)

## 2023-12-04 LAB — MAGNESIUM
Magnesium: 2.8 mg/dL — ABNORMAL HIGH (ref 1.7–2.4)
Magnesium: 2.7 mg/dL — ABNORMAL HIGH (ref 1.7–2.4)

## 2023-12-04 LAB — COOXEMETRY PANEL
Carboxyhemoglobin: 1.5 % (ref 0.5–1.5)
Methemoglobin: 0.9 % (ref 0.0–1.5)
O2 Saturation: 83.5 %
Total hemoglobin: <6.9 g/dL — CL (ref 12.0–16.0)

## 2023-12-04 MED ORDER — SORBITOL 70 % SOLN
60.0000 mL | Freq: Once | Status: AC
  Administered 2023-12-04: 60 mL
  Filled 2023-12-04: qty 60

## 2023-12-04 MED ORDER — POTASSIUM CHLORIDE 20 MEQ PO PACK
20.0000 meq | PACK | Freq: Once | ORAL | Status: AC
  Administered 2023-12-04: 20 meq via ORAL
  Filled 2023-12-04: qty 1

## 2023-12-04 MED ORDER — SODIUM CHLORIDE 0.9% IV SOLUTION: Freq: Once | INTRAVENOUS | Status: AC

## 2023-12-04 NOTE — Progress Notes (Signed)
 Patient ID: Vanessa [name], female   DOB: [birth year], 73 y.o.   MRN: [id number]      Advanced Heart Failure Rounding Note  Cardiologist: Dr. Rolan   Chief Complaint: Cardiogenic shock  Subjective:     POD#4 s/p MVR (tissue) and TV ring and same day return to OR for tamponade/bleeding. 1/2: Progressive volume overload with poor UOP, CVVH started but ended up re-intubated.    Currently on 0.25 milrinone , epinephrine  7, NE 31, vasopressin  0.04. Methylene blue  yesterday.  Hydrocortisone  100 q12.  Wide pulse pressure still.   Remains on vancomycin /meropenem . WBCs 19 => 15.   She is in atrial fibrillation with controlled rate.  She has had occasional short runs of wide complex idioventricular rhythm with rate around 100.  On amiodarone  gtt 60 mg/hr.   Swan #s: CVP 15 PAP 66/23 (improved) CI 3.35 Co-ox 83%  I/Os net negative 5539 via CVVH, tolerating up to 300 cc/hr net negative UF.  FiO2 0.6 on vent.   Hgb lower 7.7, plts 66 => 75K  Awake on vent, follows commands.   Limited echo 12/02/23: EF 40-45%, RV function reduced, small pericardial effusions w/ no evidence of tamponade but technically difficult study  Objective:   Weight Range: 94 kg Body mass index is 36.71 kg/m.   Vital Signs:   Temp:  [98.6 F (37 C)-99.5 F (37.5 C)] 99 F (37.2 C) (01/04 0700) Pulse Rate:  [64-86] 69 (01/04 0700) Resp:  [15-31] 22 (01/04 0700) BP: (115-139)/(40-94) 115/59 (01/04 0700) SpO2:  [93 %-100 %] 99 % (01/04 0700) Arterial Line BP: (90-139)/(31-46) 108/40 (01/04 0700) FiO2 (%):  [60 %-70 %] 60 % (01/04 0313) Weight:  [94 kg] 94 kg (01/04 0500) Last BM Date :  (Prior to admission)  Weight change: Filed Weights   12/02/23 0500 12/03/23 0500 12/04/23 0500  Weight: 97.1 kg 99.1 kg 94 kg   Intake/Output:   Intake/Output Summary (Last 24 hours) at 12/04/2023 0720 Last data filed at 12/04/2023 0700 Gross per 24 hour  Intake 4684.74 ml  Output 10223.9 ml  Net -5539.16 ml     Physical Exam   General: Awake on vent Neck: JVP 14-16 cm, no thyromegaly or thyroid  nodule.  Lungs: Decreased at bases.  CV: Nondisplaced PMI.  Heart regular S1/S2, no S3/S4, no murmur.  1+ edema to knees.  Abdomen: Soft, nontender, no hepatosplenomegaly, no distention.  Skin: Intact without lesions or rashes.  Neurologic: Alert, follows commands.  Extremities: No clubbing or cyanosis.  HEENT: Normal.   Telemetry   Atrial fibrillation 80s (personally reviewed)  Labs    CBC Recent Labs    12/03/23 0319 12/03/23 0403 12/04/23 0430 12/04/23 0436  WBC 19.1*  --   --  15.1*  HGB 8.1*   < > 8.2* 7.7*  HCT 24.9*   < > 24.0* 23.7*  MCV 92.6  --   --  92.2  PLT 66*  --   --  75*   < > = values in this interval not displayed.   Basic Metabolic Panel Recent Labs    98/96/74 1614 12/04/23 0430 12/04/23 0436  NA 134* 137 134*  K 4.2 3.5 3.8  CL 101  --  102  CO2 23  --  22  GLUCOSE 152*  --  172*  BUN 28*  --  31*  CREATININE 1.90*  --  1.86*  CALCIUM  8.1*  --  8.1*  MG 2.8*  --  2.8*  PHOS 3.8  --  3.3  Liver Function Tests Recent Labs    12/03/23 1614 12/04/23 0436  ALBUMIN  3.6 3.4*   No results for input(s): LIPASE, AMYLASE in the last 72 hours. Cardiac Enzymes No results for input(s): CKTOTAL, CKMB, CKMBINDEX, TROPONINI in the last 72 hours.  BNP: BNP (last 3 results) Recent Labs    05/26/23 1610 08/26/23 1103 09/22/23 0905  BNP 778.0* 781.0* 926.0*   ProBNP (last 3 results) No results for input(s): PROBNP in the last 8760 hours.  D-Dimer No results for input(s): DDIMER in the last 72 hours. Hemoglobin A1C No results for input(s): HGBA1C in the last 72 hours. Fasting Lipid Panel No results for input(s): CHOL, HDL, LDLCALC, TRIG, CHOLHDL, LDLDIRECT in the last 72 hours.  Thyroid  Function Tests No results for input(s): TSH, T4TOTAL, T3FREE, THYROIDAB in the last 72 hours.  Invalid input(s):  FREET3  Other results:  Imaging   DG Abd Portable 1V Result Date: 12/03/2023 CLINICAL DATA:  Feeding tube placement. EXAM: PORTABLE ABDOMEN - 1 VIEW COMPARISON:  None Available. FINDINGS: Tip of the weighted enteric tube is in the mid abdomen in the region of the distal stomach. Question underlying gastric distension. Generalized paucity of upper abdominal bowel gas. IMPRESSION: Tip of the weighted enteric tube in the mid abdomen in the region of the distal stomach. Electronically Signed   By: Andrea Gasman M.D.   On: 12/03/2023 11:20    Medications:     Scheduled Medications:  acetaminophen   1,000 mg Oral Q6H   Or   acetaminophen  (TYLENOL ) oral liquid 160 mg/5 mL  1,000 mg Per Tube Q6H   acetaminophen  (TYLENOL ) oral liquid 160 mg/5 mL  650 mg Per Tube Once   arformoterol   15 mcg Nebulization BID   aspirin  EC  325 mg Oral Daily   Or   aspirin   324 mg Per Tube Daily   bisacodyl   10 mg Oral Daily   Or   bisacodyl   10 mg Rectal Daily   Chlorhexidine  Gluconate Cloth  6 each Topical Daily   docusate  100 mg Per Tube BID   feeding supplement (PROSource TF20)  60 mL Per Tube QID   heparin  injection (subcutaneous)  5,000 Units Subcutaneous Q8H   hydrocortisone  sod succinate (SOLU-CORTEF ) inj  100 mg Intravenous Q12H   influenza vaccine adjuvanted  0.5 mL Intramuscular Tomorrow-1000   insulin  aspart  0-24 Units Subcutaneous Q4H   multivitamin  1 tablet Per Tube QHS   mouth rinse  15 mL Mouth Rinse Q2H   pantoprazole  (PROTONIX ) IV  40 mg Intravenous Q24H   polyethylene glycol  17 g Per Tube BID   revefenacin   175 mcg Nebulization Daily   senna  1 tablet Per Tube Daily   sodium chloride  flush  3 mL Intravenous Q12H    Infusions:   prismasol  BGK 4/2.5 500 mL/hr at 12/03/23 2314    prismasol  BGK 4/2.5 300 mL/hr at 12/04/23 0246   amiodarone  60 mg/hr (12/04/23 0700)   epinephrine  7 mcg/min (12/04/23 0700)   feeding supplement (VITAL AF 1.2 CAL) 20 mL/hr at 12/04/23 0700    fentaNYL  infusion INTRAVENOUS 150 mcg/hr (12/04/23 0700)   meropenem  (MERREM ) IV Stopped (12/04/23 9392)   milrinone  0.25 mcg/kg/min (12/04/23 0700)   norepinephrine  31 mcg/min (12/04/23 0700)   prismasol  BGK 4/2.5 1,000 mL/hr at 12/04/23 0450   vancomycin  Stopped (12/04/23 0521)   vasopressin  0.04 Units/min (12/04/23 0700)    PRN Medications: artificial tears, fentaNYL , heparin , HYDROmorphone  (DILAUDID ) injection, levalbuterol , midazolam , ondansetron  (ZOFRAN ) IV, mouth rinse,  oxyCODONE , pneumococcal 20-valent conjugate vaccine, sodium chloride , sodium chloride  flush   Patient Profile   Vanessa [name] is a 73 y.o. with history of nonischemic cardiomyopathy and severe MR s/p Mitral Clip x2 (2020), pHTN, and CKD IIIb.   Assessment/Plan   1. Post Cardiotomy Cardiogenic/vasoplegic Shock: Bioprosthetic MVR and tricuspid ring 11/29/23 with Dr. Maryjane. Pre-op  cath with no significant CAD.  Pre-op  echo with EF 40-45%, severe MR.  Weaned from bypass to inotropic support intra-op. High sanguineous chest tube output (>1L) with cardiac compromise and increasing pressor requirement.  Went back to the OR with post-op tamponade for clean-out.  Echo 12/31 showed EF 25-30%, moderate RV dysfunction, s/p bioprosthetic mitral valve replacement with trivial MR and tricuspid valve repair with trivial TR.  No significant pericardial effusion. Limited echo 01/01 showed EF 40-45%, RV appeared reduced, small perciardial effusion w/ no evidence of tamponade. Worsened on 1/2 and re-intubated in setting of volume overload and AKI with poor UOP.  Currently on NE 31, Epi 7, Vaso 0.04, Milrinone  0.25. CI 3.35 with co-ox 83%, CVP 15 this morning (lower).  Suspect component of septic shock/vasoplegia with high CO and shock.  Now on vancomycin /meropenem . Now aggressively pulling fluid via CVVH, net negative 5539 over the last day.  Still about 25 lbs above pre-op .  - She is doing better but continues to need volume off.  MAP  has been limiting CVVH but has wide pulse pressure.  Would aim to keep SBP > 100.  Pull net negative UF at least 100 cc/hr today via CVVH (but has been tolerating more).  - Would not be aggressive with pressor down-titration until we have removed further volume, do not want BP to limit volume removal. Continue to slowly down-titrate NE.  - With high CO/?septic shock component, will continue titrating down milrinone , 0.125 now and if tolerates will stop.  Can use dobutamine if need additional inotrope in future.  - Have discussed [facility] ECMO with Dr. Maryjane.  With high cardiac output/vasoplegic (?septic) shock and severely elevated PA pressure, think probably not a good candidate.  2. Mitral regurgitation:  TEE in 11/19 with severe MR, restricted posterior leaflet.  She had Mitraclip x 2 in 1/20. Post-op echo showed mild MR and mild mitral stenosis.  Echo in 1/22 showed moderate MR, mild-moderate MS. Echo in 3/23 showed moderate MR, mild-moderate MS. Echo 9/23 showed mild-moderate MR, mild MS (mean gradient 7 mmHg).  Echo in 9/24 showed MV s/p Mitraclip x 2 with mean gradient 6 mmHg and severe mitral regurgitation. TEE was then done to evaluate, showing 2 Mitraclips in place with mean gradient 4 mmHg and severe eccentric MR from between the 2 clips with ERO 0.54 cm^2.  She was not thought to be a candidate for an additional Mitraclip due to lack of room between the two existing clips. R/LHC (11/24) showed moderate mixed pulmonary arterial and pulmonary venous hypertension likely due to mitral valve regurgitation. S/p MV replacement and Tricuspid repair on 11/29/23 with Dr. Maryjane - Valves stable on post-op echo.  3. Ventricular ectopy: H/o PVCs.  Had runs of what looks like an idioventricular rhythm rate around 100 overnight.  - Continue Amio gtt but can decrease to 30 mg/hr.  4. Pulmonary hypertension: Most recent RHC (11/24) with PVR 4.5, with moderate mixed pulmonary arterial and pulmonary venous  hypertension likely due to mitral valve regurgitation. Now s/p MVR, PA systolic pressure lower today in 60s.  Suspect still mixed PAH/PVH with elevated PCWP.  NO was tried at  10 ppm but did not have much effect so now off.  PA pressure has come down with volume removal. - Continue to pull fluid via CVVH as aggressively as she can tolerate.  5. Toxic multinodular goiter: She is now off methimazole . TSH was normal in 8/24. Follows with Endocrinology 6. AKI on CKD stage 3: Pre-op  creatinine 1.5, now on CVVH.  - Pull at least 100 cc/hr net negative goal today.  7. Anemia: Post-op blood loss.  Hgb 7.7 today, transfuse < 8.  - Will give 1 unit PRBCs.  8. Complete heart block: She had post-op CHB.  Now appears in AF.  9. Respiratory failure: Intubated 1/2, suspect due to pulmonary edema but possible PNA. FiO2 down to 0.6 with volume removal.  - CCM following. ?Another day of fluid removal then try to extubate.  10. Atrial fibrillation: Patient appears to be in AF with controlled rate currently.  11. ID: Afebrile (Tm 99) but on CVVH.  WBCs 19 => 15.  Suspected component of septic shock (?PNA) with high cardiac output and hypotension.   - Continue vancomycin /meropenem .  12. Thrombocytopenia: Suspect inflammatory and not HIT (on heparin  [place]).  Now rising, 66 => 75K.   CRITICAL CARE Performed by: Ezra Shuck  Total critical care time: 45 minutes  Critical care time was exclusive of separately billable procedures and treating other patients.  Critical care was necessary to treat or prevent imminent or life-threatening deterioration.  Critical care was time spent personally by me on the following activities: development of treatment plan with patient and/or surrogate as well as nursing, discussions with consultants, evaluation of patient's response to treatment, examination of patient, obtaining history from patient or surrogate, ordering and performing treatments and interventions, ordering and review  of laboratory studies, ordering and review of radiographic studies, pulse oximetry and re-evaluation of patient's condition.  Ezra Shuck 12/04/2023 7:20 AM

## 2023-12-04 NOTE — Progress Notes (Signed)
 eLink Physician-Brief Progress Note Patient Name: Vanessa [name] DOB: 1951/06/25 MRN: [id number]   Date of Service  12/04/2023  HPI/Events of Note  RN requested an order for flexiseal system since patient now with 4th loose BM with skin excoriation. Platelets are better today at 75 K and is not on therapeutic AC. No bleeding.   eICU Interventions  Order placed     Intervention Category Intermediate Interventions: Other:  Salvador Coupe G Layten Aiken 12/04/2023, 7:53 PM

## 2023-12-04 NOTE — Progress Notes (Signed)
 [facility] Progress Note   Assessment/ Plan:   Vanessa [name] is a/an 73 y.o. female with a past medical history HTN, NICM, CHF, pulmonary hypertension, mitral and tricuspid valve disease  who present w/ surgery for valvular disease c/b shock, AHRF, and AKI    Oliguric AKI on CKD 3A: Mild baseline CKD likely multifactorial.  Now with AKI likely ATN and cardiorenal syndrome in the setting of shock.  No obvious uremic symptoms but nearing the need for intubation with worsening pulmonary edema and volume overload.  - continue CRRT- all 4k, max UF -Dialysis catheter placed by CCM, appreciate help -Continue to monitor daily Cr, Dose meds for GFR -Monitor Daily I/Os, Daily weight  -Maintain MAP>65 for optimal renal perfusion.  -Avoid nephrotoxic medications including NSAIDs -Use synthetic opioids (Fentanyl /Dilaudid ) if needed   Multifactorial shock: Vasoplegic and cardioplegia catheter surgery.  On pressors and inotropes.  Continue management per primary team - additionally on antibiotics and stress dose steroids   Acute exacerbation of heart failure with reduced ejection fraction: Volume overload.  Worsening pulmonary edema.  Diuretics and CRT as above.   Severe MR and tricuspid valvular disease: Status post repair.  CT surgery managing   Postoperative cardiac tamponade: Continue monitoring per cardiology   Hyponatremia: In the setting of AKI.  Continue to monitor   Anemia: Related to blood loss predominantly.  Transfusions per primary team  Subjective:    Doing well with CRRT, net engative 5.5L  Awake today.  Nephew and his spouse in room- all questions answered   Objective:   BP 130/62   Pulse 74   Temp 99.1 F (37.3 C)   Resp (!) 30   Ht 5' 3 (1.6 m)   Wt 94 kg   SpO2 100%   BMI 36.71 kg/m   Intake/Output Summary (Last 24 hours) at 12/04/2023 1125 Last data filed at 12/04/2023 1115 Gross per 24 hour  Intake 4524.2 ml  Output 10322.7 ml  Net -5798.5 ml    Weight change: -5.1 kg  Physical Exam: Gen:ill-appearing CVS: tachycardic Resp: intubated, mech bilaterally Abd: soft Ext: 2+ LE edema  Imaging: DG Abd Portable 1V Result Date: 12/03/2023 CLINICAL DATA:  Feeding tube placement. EXAM: PORTABLE ABDOMEN - 1 VIEW COMPARISON:  None Available. FINDINGS: Tip of the weighted enteric tube is in the mid abdomen in the region of the distal stomach. Question underlying gastric distension. Generalized paucity of upper abdominal bowel gas. IMPRESSION: Tip of the weighted enteric tube in the mid abdomen in the region of the distal stomach. Electronically Signed   By: Andrea Gasman M.D.   On: 12/03/2023 11:20   DG CHEST PORT 1 VIEW Result Date: 12/03/2023 CLINICAL DATA:  Pleural effusions. EXAM: PORTABLE CHEST 1 VIEW COMPARISON:  Radiograph 12/02/2023 FINDINGS: Endotracheal tube, NG tube Swan-Ganz catheter and LEFT central venous line are unchanged. Stable enlarged cardiac silhouette. Bilateral pleural effusions. Central venous congestion. No pneumothorax. IMPRESSION: 1. No significant change.  Stable support apparatus 2. Bilateral pleural effusions and central venous congestion. Electronically Signed   By: Jackquline Boxer M.D.   On: 12/03/2023 09:30   DG CHEST PORT 1 VIEW Result Date: 12/02/2023 CLINICAL DATA:  CHF intubated EXAM: PORTABLE CHEST 1 VIEW COMPARISON:  12/02/2023, 12/01/2023 FINDINGS: Interval intubation, tip of the endotracheal tube is at the carina. Esophageal tube tip in the left upper quadrant. Left-sided central venous catheter tip over the SVC. Right IJ Swan-Ganz catheter tip over the pulmonary artery confluence. Sternotomy changes. Similar cardiomegaly. Bilateral left  greater than right pleural effusions and left basilar consolidation without significant change. IMPRESSION: 1. Interval intubation, tip of the endotracheal tube is at the carina. 2. Esophageal tube tip in the left upper quadrant of the abdomen 3. Otherwise no significant  change in cardiomegaly, bilateral pleural effusions, and left basilar consolidation. Electronically Signed   By: Luke Bun M.D.   On: 12/02/2023 21:07   DG CHEST PORT 1 VIEW Result Date: 12/02/2023 CLINICAL DATA:  Catheter placement. EXAM: PORTABLE CHEST 1 VIEW COMPARISON:  Same day. FINDINGS: Interval placement of left internal jugular dialysis catheter with distal tip in expected position of the SVC. No pneumothorax is noted. IMPRESSION: Interval placement of left internal jugular dialysis catheter with distal tip in expected position of the SVC. Electronically Signed   By: Lynwood Landy Raddle M.D.   On: 12/02/2023 15:15    Labs: BMET Recent Labs  Lab 12/01/23 2131 12/02/23 0329 12/02/23 1237 12/02/23 1528 12/02/23 1645 12/02/23 1951 12/03/23 0012 12/03/23 0319 12/03/23 0403 12/03/23 1614 12/04/23 0430 12/04/23 0436  NA 132* 133* 131* 133*   < > 133* 134* 131* 134* 134* 137 134*  K 4.5 4.6 4.6 4.7   < > 4.5 4.5 4.1 4.1 4.2 3.5 3.8  CL 101 101 97* 102  --   --   --  100  --  101  --  102  CO2 21* 22 21* 19*  --   --   --  22  --  23  --  22  GLUCOSE 146* 128* 137* 132*  --   --   --  144*  --  152*  --  172*  BUN 41* 40* 43* 46*  --   --   --  33*  --  28*  --  31*  CREATININE 2.38* 2.54* 2.72* 2.76*  --   --   --  2.23*  --  1.90*  --  1.86*  CALCIUM  6.9* 7.0* 7.1* 7.2*  --   --   --  7.8*  --  8.1*  --  8.1*  PHOS  --   --   --  6.6*  --   --   --  4.2  --  3.8  --  3.3   < > = values in this interval not displayed.   CBC Recent Labs  Lab 12/01/23 0359 12/02/23 0329 12/02/23 1645 12/03/23 0319 12/03/23 0403 12/04/23 0430 12/04/23 0436  WBC 16.6* 18.8*  --  19.1*  --   --  15.1*  HGB 8.8* 8.5*   < > 8.1* 8.8* 8.2* 7.7*  HCT 25.5* 25.5*   < > 24.9* 26.0* 24.0* 23.7*  MCV 88.5 91.4  --  92.6  --   --  92.2  PLT 78* 65*  --  66*  --   --  75*   < > = values in this interval not displayed.    Medications:     acetaminophen   1,000 mg Oral Q6H   Or   acetaminophen   (TYLENOL ) oral liquid 160 mg/5 mL  1,000 mg Per Tube Q6H   acetaminophen  (TYLENOL ) oral liquid 160 mg/5 mL  650 mg Per Tube Once   arformoterol   15 mcg Nebulization BID   aspirin  EC  325 mg Oral Daily   Or   aspirin   324 mg Per Tube Daily   bisacodyl   10 mg Oral Daily   Or   bisacodyl   10 mg Rectal Daily   Chlorhexidine  Gluconate Cloth  6 each Topical Daily   docusate  100 mg Per Tube BID   feeding supplement (PROSource TF20)  60 mL Per Tube QID   heparin  injection (subcutaneous)  5,000 Units Subcutaneous Q8H   hydrocortisone  sod succinate (SOLU-CORTEF ) inj  100 mg Intravenous Q12H   influenza vaccine adjuvanted  0.5 mL Intramuscular Tomorrow-1000   insulin  aspart  0-24 Units Subcutaneous Q4H   multivitamin  1 tablet Per Tube QHS   mouth rinse  15 mL Mouth Rinse Q2H   pantoprazole  (PROTONIX ) IV  40 mg Intravenous Q24H   polyethylene glycol  17 g Per Tube BID   revefenacin   175 mcg Nebulization Daily   senna  1 tablet Per Tube Daily   sodium chloride  flush  3 mL Intravenous Q12H   sorbitol   60 mL Per Tube Once    Almarie Bonine MD 12/04/2023, 11:25 AM

## 2023-12-04 NOTE — Plan of Care (Signed)
  Problem: Clinical Measurements: Goal: Ability to maintain clinical measurements within normal limits will improve Outcome: Progressing Goal: Diagnostic test results will improve Outcome: Progressing   Problem: Nutrition: Goal: Adequate nutrition will be maintained Outcome: Progressing   Problem: Cardiac: Goal: Will achieve and/or maintain hemodynamic stability Outcome: Progressing   Problem: Clinical Measurements: Goal: Postoperative complications will be avoided or minimized Outcome: Progressing   Problem: Respiratory: Goal: Respiratory status will improve Outcome: Progressing   Problem: Urinary Elimination: Goal: Ability to achieve and maintain adequate renal perfusion and functioning will improve Outcome: Progressing   Problem: Cardiac: Goal: Will achieve and/or maintain hemodynamic stability Outcome: Progressing   Problem: Respiratory: Goal: Respiratory status will improve Outcome: Progressing   Problem: Urinary Elimination: Goal: Ability to achieve and maintain adequate renal perfusion and functioning will improve Outcome: Progressing

## 2023-12-04 NOTE — Progress Notes (Signed)
 [address]       [place] [address]             [phone]                 5 Days Post-Op Procedure(s) (LRB): EXPLORATION POST OPERATIVE OPEN HEART (N/A)   Events: No events Off iNO this am _______________________________________________________________ Vitals: BP 132/61   Pulse 74   Temp 99 F (37.2 C)   Resp (!) 30   Ht 5' 3 (1.6 m)   Wt 94 kg   SpO2 100%   BMI 36.71 kg/m  Filed Weights   12/02/23 0500 12/03/23 0500 12/04/23 0500  Weight: 97.1 kg 99.1 kg 94 kg     - Neuro: alert, follows commands  - Cardiovascular: aflutter  Drips: amio 30, epi 8, milr 0.125, levo 29, vaso 0.04.   PAP: (54-94)/(23-32) 62/24 CVP:  [10 mmHg-21 mmHg] 12 mmHg CO:  [6.2 L/min-7.4 L/min] 6.2 L/min CI:  [3.32 L/min/m2-4 L/min/m2] 3.32 L/min/m2  - Pulm:  Vent Mode: PRVC FiO2 (%):  [60 %] 60 % Set Rate:  [28 bmp-30 bmp] 30 bmp Vt Set:  [420 mL] 420 mL PEEP:  [5 cmH20-10 cmH20] 10 cmH20 Plateau Pressure:  [23 cmH20-26 cmH20] 24 cmH20  ABG    Component Value Date/Time   PHART 7.287 (L) 12/04/2023 0430   PCO2ART 45.4 12/04/2023 0430   PO2ART 90 12/04/2023 0430   HCO3 21.6 12/04/2023 0430   TCO2 23 12/04/2023 0430   ACIDBASEDEF 5.0 (H) 12/04/2023 0430   O2SAT 83.5 12/04/2023 0433    - Abd: ND - Extremity: warm  .Intake/Output      01/03 0701 01/04 0700 01/04 0701 01/05 0700   I.V. (mL/kg) 2955.3 (31.4) 219.6 (2.3)   Other 50    NG/GT 821.7 70   IV Piggyback 857.8    Total Intake(mL/kg) 4684.7 (49.8) 289.6 (3.1)   Urine (mL/kg/hr) 108 (0) 0 (0)   CRRT 10115.9 1029.2   Total Output 10223.9 1029.2   Net -5539.2 -739.6           _______________________________________________________________ Labs:    Latest Ref Rng & Units 12/04/2023    4:36 AM 12/04/2023    4:30 AM 12/03/2023    4:03 AM  CBC  WBC 4.0 - 10.5 K/uL 15.1     Hemoglobin 12.0 - 15.0 g/dL 7.7  8.2  8.8   Hematocrit 36.0 - 46.0 % 23.7  24.0  26.0   Platelets 150 - 400 K/uL 75          Latest Ref Rng & Units 12/04/2023    4:36 AM 12/04/2023    4:30 AM 12/03/2023    4:14 PM  CMP  Glucose 70 - 99 mg/dL 827   847   BUN 8 - 23 mg/dL 31   28   Creatinine 9.55 - 1.00 mg/dL 8.13   8.09   Sodium 864 - 145 mmol/L 134  137  134   Potassium 3.5 - 5.1 mmol/L 3.8  3.5  4.2   Chloride 98 - 111 mmol/L 102   101   CO2 22 - 32 mmol/L 22   23   Calcium  8.9 - 10.3 mg/dL 8.1   8.1     CXR: clear  _______________________________________________________________  Assessment and Plan: POD 5 s/p MVR TVR, re-exploration  Neuro: on sedation,  awake on vent CV: off iNO, weaning gtts Pulm: wean vent as tolerated Renal: on CRRT, pulling fluid GI: on  trickle tube feeds Heme: stable ID: afebrile Endo: SSI  Dispo: continue ICU care   Ryanne Morand O Zniya Cottone 12/04/2023 10:09 AM

## 2023-12-04 NOTE — Progress Notes (Signed)
 NAME:  Vanessa [name], MRN:  [id number], DOB:  1951-04-02, LOS: 5 ADMISSION DATE:  11/29/2023, CONSULTATION DATE:  11/29/23 REFERRING MD:  Dr. Maryjane, CHIEF COMPLAINT:  postop   History of Present Illness:  72yoF with PMH significant for HTN, NICM, HFrEF (EF40-45%), PHTN, CKD and hx of prior severe MR s/p prior mitral clip x 2 in 2020.  Pt with progressive DOE found to have increasing MR  and moderate TR despite adjustments in GDMT.  Not felt to be candidate for additional mitral clip.  Repeat LHC without CAD but showed moderate PHTN.  Underwent mitral valve replacement with tissue valve and tricuspid valve repair by Dr. Maryjane on 12/30.  PCCM consulted to assist with vent and medical management post-operatively in ICU.   Pertinent  Medical History  Never smoker, MR w/ previous Mitral clip x 2 (2020), NICM, HFrEF, PHTN, HTN, CKD3b  Significant Hospital Events: Including procedures, antibiotic start and stop dates in addition to other pertinent events   MVR by Dr. Maryjane; relook for tamponade/washout later in evening without obvious source 1/2 intubated for respiratory decompensation, iNO started. Vanc, meropenem  started. CRRT started.   Interim History / Subjective:  She denies complaints. Tmax 99.  Still on amio, epi 7, NE  31, vaso 0.04. Fentanyl  for sedation.  Objective   Blood pressure (!) 115/59, pulse 69, temperature 99 F (37.2 C), resp. rate (!) 22, height 5' 3 (1.6 m), weight 94 kg, SpO2 99%. PAP: (65-101)/(23-32) 69/24 CVP:  [13 mmHg-21 mmHg] 13 mmHg CO:  [6.1 L/min-7.4 L/min] 7.4 L/min CI:  [3.1 L/min/m2-4 L/min/m2] 4 L/min/m2  Vent Mode: PRVC FiO2 (%):  [60 %-70 %] 60 % Set Rate:  [28 bmp-30 bmp] 30 bmp Vt Set:  [420 mL] 420 mL PEEP:  [5 cmH20-10 cmH20] 10 cmH20 Plateau Pressure:  [23 cmH20-26 cmH20] 23 cmH20   Intake/Output Summary (Last 24 hours) at 12/04/2023 0718 Last data filed at 12/04/2023 0700 Gross per 24 hour  Intake 4684.74 ml  Output 10223.9 ml  Net  -5539.16 ml   Filed Weights   12/02/23 0500 12/03/23 0500 12/04/23 0500  Weight: 97.1 kg 99.1 kg 94 kg   Examination: Critically ill-appearing woman lying in bed no acute distress [place]/AT, endotracheal tube Breathing comfortably on mechanical ventilation, peak pressures in the high 20s.  Plateaus inaccurate due to respiratory effort.  Mild dyssynchrony with the vent.  No significant endotracheal secretions. S1-S2, regular rate, irregular rhythm, atrial flutter on telemetry. Abdomen soft, nontender Skin warm, dry, diffuse rashes Awake, alert, following commands.  I/O- 5.5 L yesterday, still net +4.7 L for the admission Urine output 108 cc  7.29/45/90/21.6 Coox 84% BUN 31 Cr 1.86 on CRRT WBC 15.1 H/H 7.7/23.7 Platelets 75 7.26/53/65/24 Coox 73%   Resolved Hospital Problem list :   Postoperative vent management  Assessment & Plan:  Severe  MR (s/p prior mitral clip x 2 in 2020) now s/p tissue MVR Moderate TR s/p TV repair  Moderate PHTN> progressed to severe postop; PA pressures significantly proved with volume removal today NICM, HFrEF, EF 40-45% Post bypass vasoplegia and cardioplegia  Postop tamponade resolved after relook and washout POD #0 Paroxysmal Aflutter - Postop care per cardiothoracic surgery - Continue milrinone  per cardiology. - Continue epinephrine , norepinephrine , vasopressin  to systolic goal greater than 100.  Has high pulse pressure making MAP less useful. - Off inhaled nitric oxide - Continue vent support - Continue amiodarone  - Monitor electrolytes and replete as needed - Telemetry monitoring. - Need to discuss  timing of starting full dose anticoagulation with surgery.  Mixed cardiogenic and distributive shock - Previously given methylene blue  on 1/2 -Continue antibiotics, inotropes, vasopressor support  Acute pulmonary edema Acute respiratory failure with hypoxia requiring mechanical ventilation - Low tidal volume ventilation - PAD protocol for  sedation goal RASS 0 to -1 - VAP prevention protocol - Daily SAT and SBT as appropriate.  Still requiring high vent settings.  Titrate PEEP and FiO2 per ARDS protocol. - Continue steroids  Expected post-operative ABLA Some to thrombocytopenia-improving - Transfuse for hemoglobin less than 7 or hemodynamically significant bleeding  AKI on CKD3b now query cardiorenal vs. Atn from initial hemodynamic insults -CRRT, pulling 300 cc/h - Renal dose meds, avoid nephrotoxins - Maintain adequate renal perfusion - If urine output remains low can pull Foley and monitor with bladder scans  Possible infection; no identified source -Continue vancomycin  and meropenem  - Collect respiratory culture if able  At risk for malnutrition - Trickle feeds with ongoing high pressor requirements  Constipation -Continue MiraLAX  twice daily, daily senna  -Sorbitol  today  Best Practice (right click and Reselect all SmartList Selections daily)   Diet/type: TF DVT prophylaxis [place] heparin  Pressure ulcer(s): None GI prophylaxis: PPI Lines: Central line, Dialysis Catheter, and Arterial Line Foley:  Yes, and it is still needed Code Status:  full code Last date of multidisciplinary goals of care discussion [per primary]  This patient is critically ill with multiple organ system failure which requires frequent high complexity decision making, assessment, support, evaluation, and titration of therapies. This was completed through the application of advanced monitoring technologies and extensive interpretation of multiple databases. During this encounter critical care time was devoted to patient care services described in this note for 37 minutes.  [name], DO 12/04/23 9:26 AM [facility]  For contact information, see Amion. If no response to pager, please call PCCM consult pager. After hours, 7PM- 7AM, please call Elink.

## 2023-12-04 NOTE — Plan of Care (Signed)
  Problem: Education: Goal: Knowledge of General Education information will improve Description: Including pain rating scale, medication(s)/side effects and non-pharmacologic comfort measures Outcome: Progressing   Problem: Clinical Measurements: Goal: Ability to maintain clinical measurements within normal limits will improve Outcome: Progressing Goal: Will remain free from infection Outcome: Progressing Goal: Diagnostic test results will improve Outcome: Progressing Goal: Respiratory complications will improve Outcome: Progressing   Problem: Nutrition: Goal: Adequate nutrition will be maintained Outcome: Progressing   Problem: Coping: Goal: Level of anxiety will decrease Outcome: Progressing   Problem: Elimination: Goal: Will not experience complications related to urinary retention Outcome: Progressing   Problem: Pain Management: Goal: General experience of comfort will improve Outcome: Progressing   Problem: Safety: Goal: Ability to remain free from injury will improve Outcome: Progressing   Problem: Skin Integrity: Goal: Risk for impaired skin integrity will decrease Outcome: Progressing   Problem: Cardiac: Goal: Will achieve and/or maintain hemodynamic stability Outcome: Progressing   Problem: Skin Integrity: Goal: Wound healing without signs and symptoms of infection Outcome: Progressing   Problem: Cardiac: Goal: Will achieve and/or maintain hemodynamic stability Outcome: Progressing

## 2023-12-05 DIAGNOSIS — R570 Cardiogenic shock: Secondary | ICD-10-CM | POA: Diagnosis not present

## 2023-12-05 DIAGNOSIS — J9601 Acute respiratory failure with hypoxia: Secondary | ICD-10-CM | POA: Diagnosis not present

## 2023-12-05 DIAGNOSIS — J9602 Acute respiratory failure with hypercapnia: Secondary | ICD-10-CM | POA: Diagnosis not present

## 2023-12-05 DIAGNOSIS — Z952 Presence of prosthetic heart valve: Secondary | ICD-10-CM | POA: Diagnosis not present

## 2023-12-05 DIAGNOSIS — I272 Pulmonary hypertension, unspecified: Secondary | ICD-10-CM | POA: Diagnosis not present

## 2023-12-05 LAB — HEPATIC FUNCTION PANEL
ALT: 31 U/L (ref 0–44)
AST: 149 U/L — ABNORMAL HIGH (ref 15–41)
Albumin: 3.3 g/dL — ABNORMAL LOW (ref 3.5–5.0)
Alkaline Phosphatase: 78 U/L (ref 38–126)
Bilirubin, Direct: 8.4 mg/dL — ABNORMAL HIGH (ref 0.0–0.2)
Indirect Bilirubin: 5 mg/dL — ABNORMAL HIGH (ref 0.3–0.9)
Total Bilirubin: 13.4 mg/dL — ABNORMAL HIGH (ref 0.0–1.2)
Total Protein: 6.7 g/dL (ref 6.5–8.1)

## 2023-12-05 LAB — RENAL FUNCTION PANEL
Albumin: 3.7 g/dL (ref 3.5–5.0)
Albumin: 3.3 g/dL — ABNORMAL LOW (ref 3.5–5.0)
Anion gap: 14 (ref 5–15)
Anion gap: 11 (ref 5–15)
BUN: 39 mg/dL — ABNORMAL HIGH (ref 8–23)
BUN: 42 mg/dL — ABNORMAL HIGH (ref 8–23)
CO2: 20 mmol/L — ABNORMAL LOW (ref 22–32)
CO2: 22 mmol/L (ref 22–32)
Calcium: 8.9 mg/dL (ref 8.9–10.3)
Calcium: 8.7 mg/dL — ABNORMAL LOW (ref 8.9–10.3)
Chloride: 101 mmol/L (ref 98–111)
Chloride: 102 mmol/L (ref 98–111)
Creatinine, Ser: 1.67 mg/dL — ABNORMAL HIGH (ref 0.44–1.00)
Creatinine, Ser: 1.68 mg/dL — ABNORMAL HIGH (ref 0.44–1.00)
GFR, Estimated: 32 mL/min — ABNORMAL LOW (ref >60)
GFR, Estimated: 32 mL/min — ABNORMAL LOW (ref >60)
Glucose, Bld: 154 mg/dL — ABNORMAL HIGH (ref 70–99)
Glucose, Bld: 143 mg/dL — ABNORMAL HIGH (ref 70–99)
Phosphorus: 2 mg/dL — ABNORMAL LOW (ref 2.5–4.6)
Phosphorus: 1.8 mg/dL — ABNORMAL LOW (ref 2.5–4.6)
Potassium: 3.8 mmol/L (ref 3.5–5.1)
Potassium: 3.8 mmol/L (ref 3.5–5.1)
Sodium: 135 mmol/L (ref 135–145)
Sodium: 135 mmol/L (ref 135–145)

## 2023-12-05 LAB — CBC
HCT: 29 % — ABNORMAL LOW (ref 36.0–46.0)
HCT: 28.2 % — ABNORMAL LOW (ref 36.0–46.0)
Hemoglobin: 9.6 g/dL — ABNORMAL LOW (ref 12.0–15.0)
Hemoglobin: 9.4 g/dL — ABNORMAL LOW (ref 12.0–15.0)
MCH: 29 pg (ref 26.0–34.0)
MCH: 29 pg (ref 26.0–34.0)
MCHC: 33.1 g/dL (ref 30.0–36.0)
MCHC: 33.3 g/dL (ref 30.0–36.0)
MCV: 87.6 fL (ref 80.0–100.0)
MCV: 87 fL (ref 80.0–100.0)
Platelets: 97 10*3/uL — ABNORMAL LOW (ref 150–400)
Platelets: 104 10*3/uL — ABNORMAL LOW (ref 150–400)
RBC: 3.31 MIL/uL — ABNORMAL LOW (ref 3.87–5.11)
RBC: 3.24 MIL/uL — ABNORMAL LOW (ref 3.87–5.11)
RDW: 17.4 % — ABNORMAL HIGH (ref 11.5–15.5)
RDW: 17.5 % — ABNORMAL HIGH (ref 11.5–15.5)
WBC: 16.3 10*3/uL — ABNORMAL HIGH (ref 4.0–10.5)
WBC: 17.1 10*3/uL — ABNORMAL HIGH (ref 4.0–10.5)
nRBC: 2.7 % — ABNORMAL HIGH (ref 0.0–0.2)
nRBC: 4.4 % — ABNORMAL HIGH (ref 0.0–0.2)

## 2023-12-05 LAB — TYPE AND SCREEN
ABO/RH(D): A POS
Antibody Screen: NEGATIVE
Unit division: 0

## 2023-12-05 LAB — GLUCOSE, CAPILLARY
Glucose-Capillary: 87 mg/dL (ref 70–99)
Glucose-Capillary: 155 mg/dL — ABNORMAL HIGH (ref 70–99)
Glucose-Capillary: 141 mg/dL — ABNORMAL HIGH (ref 70–99)
Glucose-Capillary: 148 mg/dL — ABNORMAL HIGH (ref 70–99)
Glucose-Capillary: 166 mg/dL — ABNORMAL HIGH (ref 70–99)
Glucose-Capillary: 114 mg/dL — ABNORMAL HIGH (ref 70–99)

## 2023-12-05 LAB — BPAM RBC
Blood Product Expiration Date: 202501272359
ISSUE DATE / TIME: 202501040912
Unit Type and Rh: 6200

## 2023-12-05 LAB — POCT I-STAT 7, (LYTES, BLD GAS, ICA,H+H)
Acid-base deficit: 1 mmol/L (ref 0.0–2.0)
Bicarbonate: 23.7 mmol/L (ref 20.0–28.0)
Calcium, Ion: 1.12 mmol/L — ABNORMAL LOW (ref 1.15–1.40)
HCT: 31 % — ABNORMAL LOW (ref 36.0–46.0)
Hemoglobin: 10.5 g/dL — ABNORMAL LOW (ref 12.0–15.0)
O2 Saturation: 99 %
Patient temperature: 37
Potassium: 3.8 mmol/L (ref 3.5–5.1)
Sodium: 136 mmol/L (ref 135–145)
TCO2: 25 mmol/L (ref 22–32)
pCO2 arterial: 37 mm[Hg] (ref 32–48)
pH, Arterial: 7.414 (ref 7.35–7.45)
pO2, Arterial: 124 mm[Hg] — ABNORMAL HIGH (ref 83–108)

## 2023-12-05 LAB — APTT: aPTT: 60 s — ABNORMAL HIGH (ref 24–36)

## 2023-12-05 LAB — COOXEMETRY PANEL
Carboxyhemoglobin: 1.5 % (ref 0.5–1.5)
Methemoglobin: <0.7 % (ref 0.0–1.5)
O2 Saturation: 61.6 %
Total hemoglobin: 10.9 g/dL — ABNORMAL LOW (ref 12.0–16.0)

## 2023-12-05 LAB — HEPARIN LEVEL (UNFRACTIONATED): Heparin Unfractionated: 0.16 [IU]/mL — ABNORMAL LOW (ref 0.30–0.70)

## 2023-12-05 MED ORDER — HEPARIN (PORCINE) 25000 UT/250ML-% IV SOLN
1050.0000 [IU]/h | INTRAVENOUS | Status: DC
  Administered 2023-12-05: 800 [IU]/h via INTRAVENOUS
  Administered 2023-12-06: 1100 [IU]/h via INTRAVENOUS
  Administered 2023-12-07: 1150 [IU]/h via INTRAVENOUS
  Administered 2023-12-08: 1300 [IU]/h via INTRAVENOUS
  Administered 2023-12-09: 1350 [IU]/h via INTRAVENOUS
  Administered 2023-12-10 (×3): 1400 [IU]/h via INTRAVENOUS
  Administered 2023-12-11 – 2023-12-12 (×2): 1300 [IU]/h via INTRAVENOUS
  Administered 2023-12-13 – 2023-12-15 (×4): 1100 [IU]/h via INTRAVENOUS
  Administered 2023-12-16 – 2023-12-21 (×5): 950 [IU]/h via INTRAVENOUS
  Administered 2023-12-22 – 2023-12-23 (×2): 1050 [IU]/h via INTRAVENOUS
  Filled 2023-12-05 (×24): qty 250

## 2023-12-05 MED ORDER — POTASSIUM & SODIUM PHOSPHATES 280-160-250 MG PO PACK
2.0000 | PACK | ORAL | Status: AC
  Administered 2023-12-05 (×2): 2 via ORAL
  Filled 2023-12-05 (×2): qty 2

## 2023-12-05 NOTE — Progress Notes (Signed)
 NAME:  Vanessa [name], MRN:  [id number], DOB:  04-18-1951, LOS: 6 ADMISSION DATE:  11/29/2023, CONSULTATION DATE:  11/29/23 REFERRING MD:  Dr. Maryjane, CHIEF COMPLAINT:  postop   History of Present Illness:  72yoF with PMH significant for HTN, NICM, HFrEF (EF40-45%), PHTN, CKD and hx of prior severe MR s/p prior mitral clip x 2 in 2020.  Pt with progressive DOE found to have increasing MR  and moderate TR despite adjustments in GDMT.  Not felt to be candidate for additional mitral clip.  Repeat LHC without CAD but showed moderate PHTN.  Underwent mitral valve replacement with tissue valve and tricuspid valve repair by Dr. Maryjane on 12/30.  PCCM consulted to assist with vent and medical management post-operatively in ICU.   Pertinent  Medical History  Never smoker, MR w/ previous Mitral clip x 2 (2020), NICM, HFrEF, PHTN, HTN, CKD3b  Significant Hospital Events: Including procedures, antibiotic start and stop dates in addition to other pertinent events   MVR by Dr. Maryjane; relook for tamponade/washout later in evening without obvious source 1/2 intubated for respiratory decompensation, iNO started. Vanc, meropenem  started. CRRT started.   Interim History / Subjective:  Remains on MV, denies pain or other complaints.  Milrinone  0.125 Epi 7 NE 20 Vaso 0.04  Objective   Blood pressure (!) 151/64, pulse 79, temperature 98.4 F (36.9 C), resp. rate (!) 24, height 5' 3 (1.6 m), weight 84.1 kg, SpO2 100%. PAP: (50-97)/(22-49) 54/23 CVP:  [7 mmHg-25 mmHg] 8 mmHg PCWP:  [31 mmHg] 31 mmHg CO:  [4.6 L/min-5.7 L/min] 4.6 L/min CI:  [2.34 L/min/m2-3.04 L/min/m2] 2.34 L/min/m2  Vent Mode: PRVC FiO2 (%):  [40 %-50 %] 40 % Set Rate:  [30 bmp] 30 bmp Vt Set:  [420 mL] 420 mL PEEP:  [8 cmH20-10 cmH20] 8 cmH20 Plateau Pressure:  [21 cmH20-26 cmH20] 21 cmH20   Intake/Output Summary (Last 24 hours) at 12/05/2023 0936 Last data filed at 12/05/2023 0900 Gross per 24 hour  Intake 3641.82 ml  Output  11744.4 ml  Net -8102.58 ml   Filed Weights   12/03/23 0500 12/04/23 0500 12/05/23 0530  Weight: 99.1 kg 94 kg 84.1 kg   Examination: Critically ill appearing woman lying in bed in NAD, intubated & awake.  [place]/AT, mild scleral icterus. Breathing comfortably on MV, CTAB. No significant ETT secretions. Pplat <30 S1S2, RRR. MPAP now in low 30s. Abd soft, NT Improving peripheral edema No cyanosis Skin warm & dry Following commands, RASS 0.   I/O- 8.1 L yesterday, net -3.4L for the admission Urine output 42 cc  Coox 62% BUN 39 Cr 1.67 on CRRT WBC 16.3 H/H 9.6/29 Platelets 97   Resolved Hospital Problem list :   Postoperative vent management  Assessment & Plan:  Severe MR (s/p prior mitral clip x 2 in 2020) now s/p tissue MVR Moderate TR s/p TV repair  Moderate PHTN> progressed to severe postop; PA pressures significantly proved with volume removal today NICM, HFrEF, EF 40-45% Post bypass vasoplegia and cardioplegia  Postop tamponade resolved after relook and washout POD #0 Paroxysmal A-flutter, Afib. -Post-op care per TCTS. -Con't volume removal with CRRT. Dramatic improvement in PA pressures with diuresis. -Milrinone  dosing per cardiology -con't NE, vaso, epi to maintain SBP >100 since wide pulse pressure.  - Con't amiodarone  @ 30mg /h -con't vent support- weaning - tele monitoring -- monitor electrolytes - start heparin  gtt, no bolus for Afib prophylaxis; d/w TCTS  Mixed cardiogenic and distributive shock - Previously given methylene blue   on 1/2 -con't inotropic and vasopressor support  Acute pulmonary edema Acute respiratory failure with hypoxia requiring mechanical ventilation -LTVV -weaned to 30%, PEEP 8- likely getting close to extubation -PAD protocol for sedation -VAP prevention protocol -daily SAT & SBT as appropriate; RN weaning sedation to try SBT again later (low volumes this morning) -stress dose steroids; hopefully can start weaning  soon.  Expected post-operative ABLA Some to thrombocytopenia-improving - transfuse for Hb <7 or hemodynamically significant bleeding -d/w TCTS, ok to start heparin  gtt and monitor H/H   AKI on CKD3b now query cardiorenal vs. ATN from initial hemodynamic insults. -CRRT, pulling ~100cc/h today given large volume removal in the past few days.  -renally dose meds, avoid nephrotoxic meds -d/c foley, bladder scans  Possible infection; no identified source -Con't vanc & meropenem   At risk for malnutrition -con't TF  Constipation; resolved -Continue MiraLAX  twice daily -stop senna  with liquid BM  No family at bedside during rounds today.   Best Practice (right click and Reselect all SmartList Selections daily)   Diet/type: TF DVT prophylaxis systemic heparin  Pressure ulcer(s): None GI prophylaxis: PPI Lines: Central line, Dialysis Catheter, and Arterial Line Foley:  Yes, and it is still needed Code Status:  full code Last date of multidisciplinary goals of care discussion [per primary]  This patient is critically ill with multiple organ system failure which requires frequent high complexity decision making, assessment, support, evaluation, and titration of therapies. This was completed through the application of advanced monitoring technologies and extensive interpretation of multiple databases. During this encounter critical care time was devoted to patient care services described in this note for 35 minutes.  Vanessa Vanessa Gaskins, DO 12/05/23 12:31 PM [facility]  For contact information, see Amion. If no response to pager, please call PCCM consult pager. After hours, 7PM- 7AM, please call Elink.

## 2023-12-05 NOTE — Progress Notes (Signed)
 [address]       [place] [address]             [phone]                 6 Days Post-Op Procedure(s) (LRB): EXPLORATION POST OPERATIVE OPEN HEART (N/A)   Events: No events Pulling fluid  Down on gtts _______________________________________________________________ Vitals: BP (!) 151/64   Pulse 76   Temp 98.4 F (36.9 C)   Resp (!) 22   Ht 5' 3 (1.6 m)   Wt 84.1 kg   SpO2 100%   BMI 32.84 kg/m  Filed Weights   12/03/23 0500 12/04/23 0500 12/05/23 0530  Weight: 99.1 kg 94 kg 84.1 kg     - Neuro: alert, follows commands  - Cardiovascular: aflutter  Drips: amio 30, epi 7, milr 0.125, levo 20, vaso 0.04.   PAP: (48-97)/(19-49) 51/20 CVP:  [6 mmHg-25 mmHg] 7 mmHg PCWP:  [31 mmHg] 31 mmHg CO:  [4.6 L/min-5.7 L/min] 4.6 L/min CI:  [2.34 L/min/m2-3.04 L/min/m2] 2.34 L/min/m2  - Pulm:  Vent Mode: PRVC FiO2 (%):  [40 %-50 %] 40 % Set Rate:  [30 bmp] 30 bmp Vt Set:  [420 mL] 420 mL PEEP:  [8 cmH20-10 cmH20] 8 cmH20 Plateau Pressure:  [21 cmH20-26 cmH20] 21 cmH20  ABG    Component Value Date/Time   PHART 7.287 (L) 12/04/2023 0430   PCO2ART 45.4 12/04/2023 0430   PO2ART 90 12/04/2023 0430   HCO3 21.6 12/04/2023 0430   TCO2 23 12/04/2023 0430   ACIDBASEDEF 5.0 (H) 12/04/2023 0430   O2SAT 61.6 12/05/2023 0349    - Abd: ND - Extremity: warm  .Intake/Output      01/04 0701 01/05 0700 01/05 0701 01/06 0700   I.V. (mL/kg) 2204.3 (26.2) 177.1 (2.1)   Blood 250    Other  30   NG/GT 710 60   IV Piggyback 500.1    Total Intake(mL/kg) 3664.4 (43.6) 267.1 (3.2)   Urine (mL/kg/hr) 42 (0) 7 (0)   Stool 200 0   CRRT 11539.8 984.8   Total Output 11781.8 991.8   Net -8117.4 -724.7        Stool Occurrence 2 x       _______________________________________________________________ Labs:    Latest Ref Rng & Units 12/05/2023    3:49 AM 12/04/2023    4:36 AM 12/04/2023    4:30 AM  CBC  WBC 4.0 - 10.5 K/uL 16.3  15.1    Hemoglobin 12.0 -  15.0 g/dL 9.6  7.7  8.2   Hematocrit 36.0 - 46.0 % 29.0  23.7  24.0   Platelets 150 - 400 K/uL 97  75        Latest Ref Rng & Units 12/05/2023    3:49 AM 12/04/2023    4:56 PM 12/04/2023    4:36 AM  CMP  Glucose 70 - 99 mg/dL 845  831  827   BUN 8 - 23 mg/dL 39  35  31   Creatinine 0.44 - 1.00 mg/dL 8.32  8.23  8.13   Sodium 135 - 145 mmol/L 135  135  134   Potassium 3.5 - 5.1 mmol/L 3.8  3.6  3.8   Chloride 98 - 111 mmol/L 101  101  102   CO2 22 - 32 mmol/L 20  21  22    Calcium  8.9 - 10.3 mg/dL 8.9  8.9  8.1     CXR: clear  _______________________________________________________________  Assessment and Plan: POD 6 s/p MVR TVR, re-exploration  Neuro: on sedation,  awake on vent CV: off iNO, weaning gtts Pulm: wean vent as tolerated Renal: on CRRT, pulling fluid GI: on trickle tube feeds Heme: stable ID: [email or web address] Endo: SSI  Dispo: continue ICU care   Vanessa [name] 12/05/2023 9:55 AM

## 2023-12-05 NOTE — Progress Notes (Signed)
 PHARMACY - ANTICOAGULATION CONSULT NOTE  Pharmacy Consult for IV heparin  Indication: atrial fibrillation  No Known Allergies  Patient Measurements: Height: 5' 3 (160 cm) Weight: 84.1 kg (185 lb 6.5 oz) IBW/kg (Calculated) : 52.4 Heparin  Dosing Weight: ~ 70 kg  Vital Signs: Temp: 99.1 F (37.3 C) (01/05 2100) Temp Source: Core (01/05 2000) BP: 117/61 (01/05 2100) Pulse Rate: 73 (01/05 2100)  Labs: Recent Labs    12/04/23 0436 12/04/23 1656 12/05/23 0349 12/05/23 0948 12/05/23 1301 12/05/23 1624 12/05/23 2033  HGB 7.7*  --  9.6* 10.5* 9.4*  --   --   HCT 23.7*  --  29.0* 31.0* 28.2*  --   --   PLT 75*  --  97*  --  104*  --   --   APTT  --   --   --   --   --   --  60*  HEPARINUNFRC  --   --   --   --   --   --  0.16*  CREATININE 1.86* 1.76* 1.67*  --   --  1.68*  --     Estimated Creatinine Clearance: 31.1 mL/min (A) (by C-G formula based on SCr of 1.68 mg/dL (H)).   Medical History: Past Medical History:  Diagnosis Date   CHF (congestive heart failure) (HCC)    CKD (chronic kidney disease)    Heart murmur    Hypertension    Moderate mitral regurgitation    moderate to severe MR with moderate pulmonary HTN   Multinodular goiter    Nonischemic cardiomyopathy (HCC)    EF 30-35% by echo 2015   Pulmonary HTN (HCC)    moderate with PASP by echo 2015   PVC's (premature ventricular contractions)     Medications:  Infusions:    prismasol  BGK 4/2.5 500 mL/hr at 12/05/23 1514    prismasol  BGK 4/2.5 300 mL/hr at 12/05/23 1227   amiodarone  30 mg/hr (12/05/23 2100)   epinephrine  6 mcg/min (12/05/23 2100)   feeding supplement (VITAL AF 1.2 CAL) 20 mL/hr at 12/05/23 2100   heparin  800 Units/hr (12/05/23 2100)   meropenem  (MERREM ) IV Stopped (12/05/23 1428)   milrinone  0.125 mcg/kg/min (12/05/23 2100)   norepinephrine  13 mcg/min (12/05/23 2100)   prismasol  BGK 4/2.5 1,000 mL/hr at 12/05/23 2027   vancomycin  Stopped (12/05/23 0502)   vasopressin  0.04  Units/min (12/05/23 2100)    Assessment: Vanessa [name] s/p MVR now with afib, pharmacy asked to start anticoagulation with IV heparin .  Hgb 9.6, platelet count low, but trending up 97 today.  Heparin  level came back 0.16, PTT 60. Due to her elevated bilirubin, it may cause falsely low heparin  level.   Goal of Therapy:  Heparin  level 0.3-0.5 aPTT 66-85 Monitor platelets by anticoagulation protocol: Yes   Plan:  Increase heparin  to 950 units/hr  Will target lower end of therapeutic range given recent OHS. Heparin  level in am Daily heparin  level and CBC   Sergio Batch, PharmD, BCIDP, AAHIVP, CPP Infectious Disease Pharmacist 12/05/2023 9:27 PM

## 2023-12-05 NOTE — Progress Notes (Signed)
 [facility] Progress Note   Assessment/ Plan:   Vanessa [name] is a/an 73 y.o. female with a past medical history HTN, NICM, CHF, pulmonary hypertension, mitral and tricuspid valve disease  who present w/ surgery for valvular disease c/b shock, AHRF, and AKI    Oliguric AKI on CKD 3A: Mild baseline CKD likely multifactorial.  Now with AKI likely ATN and cardiorenal syndrome in the setting of shock.  No obvious uremic symptoms but nearing the need for intubation with worsening pulmonary edema and volume overload.  - continue CRRT- all 4k, back off of UF to net net 50-100 mL/ hr, CVP 6-7 today -Dialysis catheter placed by CCM, appreciate help -Continue to monitor daily Cr, Dose meds for GFR -Monitor Daily I/Os, Daily weight  -Maintain MAP>65 for optimal renal perfusion.  -Avoid nephrotoxic medications including NSAIDs -Use synthetic opioids (Fentanyl /Dilaudid ) if needed   Multifactorial shock: Vasoplegic and cardioplegia catheter surgery.  On pressors and inotropes.  Continue management per primary team - additionally on antibiotics and stress dose steroids   Acute exacerbation of heart failure with reduced ejection fraction: Volume overload.  Worsening pulmonary edema.  Diuretics and CRT as above.   Severe MR and tricuspid valvular disease: Status post repair.  CT surgery managing   Postoperative cardiac tamponade: Continue monitoring per cardiology   Hyponatremia: In the setting of AKI.  Continue to monitor   Anemia: Related to blood loss predominantly.  Transfusions per primary team  Subjective:    Continues to do well on CRRT.  PA pressures and CVPs much improved   Objective:   BP (!) 151/64   Pulse 79   Temp 98.4 F (36.9 C)   Resp (!) 24   Ht 5' 3 (1.6 m)   Wt 84.1 kg   SpO2 100%   BMI 32.84 kg/m   Intake/Output Summary (Last 24 hours) at 12/05/2023 0854 Last data filed at 12/05/2023 0800 Gross per 24 hour  Intake 3651.79 ml  Output 11792.6 ml  Net  -8140.81 ml   Weight change: -9.9 kg  Physical Exam: Gen:ill-appearing CVS: tachycardic Resp: intubated, mech bilaterally Abd: soft Ext: edema much improved  Imaging: DG CHEST PORT 1 VIEW Result Date: 12/04/2023 CLINICAL DATA:  Pericardial effusion. EXAM: PORTABLE CHEST 1 VIEW COMPARISON:  Chest radiograph dated 12/03/2023 FINDINGS: An endotracheal tube terminates 2.0 cm from the carina. A right internal jugular Swan-Ganz catheter appears unchanged. A left internal jugular central venous catheter tip overlies the superior vena cava. The heart is enlarged. Vascular calcifications are seen in the aortic arch. There is a left pleural effusion with associated atelectasis/airspace disease. The right lung is clear. No pneumothorax. IMPRESSION: Left pleural effusion with associated atelectasis/airspace disease. Electronically Signed   By: Norman Hopper M.D.   On: 12/04/2023 11:42   DG Abd Portable 1V Result Date: 12/03/2023 CLINICAL DATA:  Feeding tube placement. EXAM: PORTABLE ABDOMEN - 1 VIEW COMPARISON:  None Available. FINDINGS: Tip of the weighted enteric tube is in the mid abdomen in the region of the distal stomach. Question underlying gastric distension. Generalized paucity of upper abdominal bowel gas. IMPRESSION: Tip of the weighted enteric tube in the mid abdomen in the region of the distal stomach. Electronically Signed   By: Andrea Gasman M.D.   On: 12/03/2023 11:20    Labs: BMET Recent Labs  Lab 12/02/23 1237 12/02/23 1528 12/02/23 1645 12/03/23 0319 12/03/23 0403 12/03/23 1614 12/04/23 0430 12/04/23 0436 12/04/23 1656 12/05/23 0349  NA 131* 133*   < >  131* 134* 134* 137 134* 135 135  K 4.6 4.7   < > 4.1 4.1 4.2 3.5 3.8 3.6 3.8  CL 97* 102  --  100  --  101  --  102 101 101  CO2 21* 19*  --  22  --  23  --  22 21* 20*  GLUCOSE 137* 132*  --  144*  --  152*  --  172* 168* 154*  BUN 43* 46*  --  33*  --  28*  --  31* 35* 39*  CREATININE 2.72* 2.76*  --  2.23*  --  1.90*   --  1.86* 1.76* 1.67*  CALCIUM  7.1* 7.2*  --  7.8*  --  8.1*  --  8.1* 8.9 8.9  PHOS  --  6.6*  --  4.2  --  3.8  --  3.3 2.6 2.0*   < > = values in this interval not displayed.   CBC Recent Labs  Lab 12/02/23 0329 12/02/23 1645 12/03/23 0319 12/03/23 0403 12/04/23 0430 12/04/23 0436 12/05/23 0349  WBC 18.8*  --  19.1*  --   --  15.1* 16.3*  HGB 8.5*   < > 8.1* 8.8* 8.2* 7.7* 9.6*  HCT 25.5*   < > 24.9* 26.0* 24.0* 23.7* 29.0*  MCV 91.4  --  92.6  --   --  92.2 87.6  PLT 65*  --  66*  --   --  75* 97*   < > = values in this interval not displayed.    Medications:     acetaminophen  (TYLENOL ) oral liquid 160 mg/5 mL  650 mg Per Tube Once   arformoterol   15 mcg Nebulization BID   aspirin  EC  325 mg Oral Daily   Or   aspirin   324 mg Per Tube Daily   bisacodyl   10 mg Oral Daily   Or   bisacodyl   10 mg Rectal Daily   Chlorhexidine  Gluconate Cloth  6 each Topical Daily   docusate  100 mg Per Tube BID   feeding supplement (PROSource TF20)  60 mL Per Tube QID   heparin  injection (subcutaneous)  5,000 Units Subcutaneous Q8H   hydrocortisone  sod succinate (SOLU-CORTEF ) inj  100 mg Intravenous Q12H   influenza vaccine adjuvanted  0.5 mL Intramuscular Tomorrow-1000   insulin  aspart  0-24 Units Subcutaneous Q4H   multivitamin  1 tablet Per Tube QHS   mouth rinse  15 mL Mouth Rinse Q2H   pantoprazole  (PROTONIX ) IV  40 mg Intravenous Q24H   polyethylene glycol  17 g Per Tube BID   revefenacin   175 mcg Nebulization Daily   senna  1 tablet Per Tube Daily   sodium chloride  flush  3 mL Intravenous Q12H    Almarie Bonine MD 12/05/2023, 8:54 AM

## 2023-12-05 NOTE — Progress Notes (Addendum)
 Reassessed after 30 min SPT 6/8, 30%. Following commands, breathing comfortably with adequate Vt. Planning to extubate to [place]. D/w RN.  [name], DO 12/05/23 3:20 PM [facility]  For contact information, see Amion. If no response to pager, please call PCCM consult pager. After hours, 7PM- 7AM, please call Elink.   Doing well post extubation.  [name], DO 12/05/23 4:24 PM [facility]

## 2023-12-05 NOTE — Plan of Care (Signed)
  Problem: Education: Goal: Knowledge of General Education information will improve Description: Including pain rating scale, medication(s)/side effects and non-pharmacologic comfort measures Outcome: Progressing   Problem: Health Behavior/Discharge Planning: Goal: Ability to manage health-related needs will improve Outcome: Progressing   Problem: Clinical Measurements: Goal: Ability to maintain clinical measurements within normal limits will improve Outcome: Progressing Goal: Will remain free from infection Outcome: Progressing Goal: Diagnostic test results will improve Outcome: Progressing Goal: Respiratory complications will improve Outcome: Progressing Goal: Cardiovascular complication will be avoided Outcome: Progressing   Problem: Activity: Goal: Risk for activity intolerance will decrease Outcome: Progressing   Problem: Nutrition: Goal: Adequate nutrition will be maintained Outcome: Progressing   Problem: Coping: Goal: Level of anxiety will decrease Outcome: Progressing   Problem: Elimination: Goal: Will not experience complications related to bowel motility Outcome: Progressing Goal: Will not experience complications related to urinary retention Outcome: Progressing   Problem: Pain Management: Goal: General experience of comfort will improve Outcome: Progressing   Problem: Safety: Goal: Ability to remain free from injury will improve Outcome: Progressing   Problem: Skin Integrity: Goal: Risk for impaired skin integrity will decrease Outcome: Progressing   Problem: Education: Goal: Will demonstrate proper wound care and an understanding of methods to prevent future damage Outcome: Progressing Goal: Knowledge of disease or condition will improve Outcome: Progressing Goal: Knowledge of the prescribed therapeutic regimen will improve Outcome: Progressing Goal: Individualized Educational Video(s) Outcome: Progressing   Problem: Activity: Goal: Risk for  activity intolerance will decrease Outcome: Progressing   Problem: Cardiac: Goal: Will achieve and/or maintain hemodynamic stability Outcome: Progressing   Problem: Clinical Measurements: Goal: Postoperative complications will be avoided or minimized Outcome: Progressing   Problem: Respiratory: Goal: Respiratory status will improve Outcome: Progressing   Problem: Skin Integrity: Goal: Wound healing without signs and symptoms of infection Outcome: Progressing Goal: Risk for impaired skin integrity will decrease Outcome: Progressing   Problem: Urinary Elimination: Goal: Ability to achieve and maintain adequate renal perfusion and functioning will improve Outcome: Progressing   Problem: Education: Goal: Will demonstrate proper wound care and an understanding of methods to prevent future damage Outcome: Progressing Goal: Knowledge of disease or condition will improve Outcome: Progressing Goal: Knowledge of the prescribed therapeutic regimen will improve Outcome: Progressing Goal: Individualized Educational Video(s) Outcome: Progressing   Problem: Activity: Goal: Risk for activity intolerance will decrease Outcome: Progressing   Problem: Cardiac: Goal: Will achieve and/or maintain hemodynamic stability Outcome: Progressing   Problem: Clinical Measurements: Goal: Postoperative complications will be avoided or minimized Outcome: Progressing   Problem: Respiratory: Goal: Respiratory status will improve Outcome: Progressing   Problem: Skin Integrity: Goal: Wound healing without signs and symptoms of infection Outcome: Progressing Goal: Risk for impaired skin integrity will decrease Outcome: Progressing   Problem: Urinary Elimination: Goal: Ability to achieve and maintain adequate renal perfusion and functioning will improve Outcome: Progressing

## 2023-12-05 NOTE — Progress Notes (Signed)
 Patient ID: Vanessa [name], female   DOB: 02-May-1951, 73 y.o.   MRN: [id number]      Advanced Heart Failure Rounding Note  Cardiologist: Dr. Rolan Chief Complaint: Cardiogenic shock  Subjective:     S/P MVR (tissue) and TV ring and same day return to OR for tamponade/bleeding. 1/2: Progressive volume overload with poor UOP, CVVH started but ended up re-intubated.    - This morning on milrinone  0.125mcg/kg/min; levophed  ~30, epi ~5; awake/alert on vent.  - CVP 9-10, PA 60s/20, PCWP 18 - Rate controlled atrial fibrillation on telemetry.  - 11L volume removal yesterday; negative 8L q24h.   Objective:   Weight Range: 84.1 kg Body mass index is 32.84 kg/m.   Vital Signs:   Temp:  [98.1 F (36.7 C)-99.5 F (37.5 C)] 98.6 F (37 C) (01/05 1112) Pulse Rate:  [70-85] 79 (01/05 1112) Resp:  [16-30] 30 (01/05 1112) BP: (101-151)/(53-70) 151/64 (01/05 0847) SpO2:  [96 %-100 %] 99 % (01/05 1112) Arterial Line BP: (90-177)/(42-71) 109/48 (01/05 1100) FiO2 (%):  [30 %-50 %] 30 % (01/05 1112) Weight:  [84.1 kg] 84.1 kg (01/05 0530) Last BM Date : 12/05/23  Weight change: Filed Weights   12/03/23 0500 12/04/23 0500 12/05/23 0530  Weight: 99.1 kg 94 kg 84.1 kg   Intake/Output:   Intake/Output Summary (Last 24 hours) at 12/05/2023 1155 Last data filed at 12/05/2023 1100 Gross per 24 hour  Intake 3351.53 ml  Output 11244.1 ml  Net -7892.57 ml    Physical Exam   General: awake on vent Neck: JVP 12-14 Lungs: mechanical; decreased at bases CV: Nondisplaced PMI.  Heart rate irregular; S1/S2, no S3/S4, no murmur.  1+ edema b/l Abdomen: Soft, nontender, no hepatosplenomegaly, no distention.  Skin: Intact without lesions or rashes.  Neurologic: following commands.  Extremities: No clubbing or cyanosis.  HEENT: Normal.   Telemetry   Atrial fibrillation; rate controlled. No significant arrhythmias.   Labs    CBC Recent Labs    12/04/23 0436 12/05/23 0349 12/05/23 0948   WBC 15.1* 16.3*  --   HGB 7.7* 9.6* 10.5*  HCT 23.7* 29.0* 31.0*  MCV 92.2 87.6  --   PLT 75* 97*  --    Basic Metabolic Panel Recent Labs    98/95/74 0436 12/04/23 1656 12/05/23 0349 12/05/23 0948  NA 134* 135 135 136  K 3.8 3.6 3.8 3.8  CL 102 101 101  --   CO2 22 21* 20*  --   GLUCOSE 172* 168* 154*  --   BUN 31* 35* 39*  --   CREATININE 1.86* 1.76* 1.67*  --   CALCIUM  8.1* 8.9 8.9  --   MG 2.8* 2.7*  --   --   PHOS 3.3 2.6 2.0*  --    Liver Function Tests Recent Labs    12/04/23 1656 12/05/23 0349  ALBUMIN  3.5 3.7   No results for input(s): LIPASE, AMYLASE in the last 72 hours. Cardiac Enzymes No results for input(s): CKTOTAL, CKMB, CKMBINDEX, TROPONINI in the last 72 hours.  BNP: BNP (last 3 results) Recent Labs    05/26/23 1610 08/26/23 1103 09/22/23 0905  BNP 778.0* 781.0* 926.0*     Other results:  Imaging   No results found.   Medications:     Scheduled Medications:  acetaminophen  (TYLENOL ) oral liquid 160 mg/5 mL  650 mg Per Tube Once   arformoterol   15 mcg Nebulization BID   aspirin  EC  325 mg Oral Daily   Or  aspirin   324 mg Per Tube Daily   bisacodyl   10 mg Oral Daily   Or   bisacodyl   10 mg Rectal Daily   Chlorhexidine  Gluconate Cloth  6 each Topical Daily   docusate  100 mg Per Tube BID   feeding supplement (PROSource TF20)  60 mL Per Tube QID   heparin  injection (subcutaneous)  5,000 Units Subcutaneous Q8H   hydrocortisone  sod succinate (SOLU-CORTEF ) inj  100 mg Intravenous Q12H   influenza vaccine adjuvanted  0.5 mL Intramuscular Tomorrow-1000   insulin  aspart  0-24 Units Subcutaneous Q4H   multivitamin  1 tablet Per Tube QHS   mouth rinse  15 mL Mouth Rinse Q2H   pantoprazole  (PROTONIX ) IV  40 mg Intravenous Q24H   polyethylene glycol  17 g Per Tube BID   revefenacin   175 mcg Nebulization Daily   senna  1 tablet Per Tube Daily   sodium chloride  flush  3 mL Intravenous Q12H    Infusions:   prismasol  BGK  4/2.5 500 mL/hr at 12/04/23 1854    prismasol  BGK 4/2.5 300 mL/hr at 12/05/23 0539   amiodarone  30 mg/hr (12/05/23 1100)   epinephrine  7 mcg/min (12/05/23 1100)   feeding supplement (VITAL AF 1.2 CAL) 20 mL/hr at 12/05/23 1100   fentaNYL  infusion INTRAVENOUS 75 mcg/hr (12/05/23 1100)   meropenem  (MERREM ) IV Stopped (12/05/23 0540)   milrinone  0.125 mcg/kg/min (12/05/23 1100)   norepinephrine  22 mcg/min (12/05/23 1100)   prismasol  BGK 4/2.5 1,000 mL/hr at 12/05/23 1037   vancomycin  Stopped (12/05/23 0502)   vasopressin  0.04 Units/min (12/05/23 1100)    PRN Medications: artificial tears, fentaNYL , heparin , HYDROmorphone  (DILAUDID ) injection, levalbuterol , midazolam , ondansetron  (ZOFRAN ) IV, mouth rinse, oxyCODONE , pneumococcal 20-valent conjugate vaccine, sodium chloride , sodium chloride  flush   Patient Profile   Vanessa [name] is a 73 y.o. with history of nonischemic cardiomyopathy and severe MR s/p Mitral Clip x2 (2020), pHTN, and CKD IIIb.   Assessment/Plan   1. Post Cardiotomy Cardiogenic/vasoplegic Shock: -Bioprosthetic MVR and tricuspid ring 11/29/23 with Dr. Maryjane. Pre-op  cath with no significant CAD.  Pre-op  echo with EF 40-45%, severe MR. Went back to the OR with post-op tamponade for clean-out.  Echo 12/31 showed EF 25-30%, moderate RV dysfunction, s/p bioprosthetic mitral valve replacement with trivial MR and tricuspid valve repair with trivial TR - Worsened on 1/2 and re-intubated in setting of volume overload, oliguric renal failure &  pulmonary hypertension  - Significant improvement in CVP this AM; down to 9-10. Goal CVP ~8 in the setting of PH. PCWP 18.  - Continue milrinone  0.125mcg/kg/min until extubated. Mixed venous 62 this AM. Will repeat later today.  - Awake/alert; possible extubation later today.  - I suspect she has mixed septic & cardiogenic shock complicated by fixed pulmonary hypertension from prolonged valvular heart disease.  - Not a candidate for V-A  ECMO in the setting of renal failure, pulmonary hypertension and age.   2. Mitral regurgitation:   - TEE in 11/19 with severe MR, restricted posterior leaflet.  She had Mitraclip x 2 in 1/20. Post-op echo showed mild MR and mild mitral stenosis.   - Echo in 9/24 showed MV s/p Mitraclip x 2 with mean gradient 6 mmHg and severe mitral regurgitation. TEE was then done to evaluate, showing 2 Mitraclips in place with mean gradient 4 mmHg and severe eccentric MR from between the 2 clips with ERO 0.54 cm^2.  She was not thought to be a candidate for an additional Mitraclip due to lack of  room between the two existing clips. R/LHC (11/24) showed moderate mixed pulmonary arterial and pulmonary venous hypertension likely due to mitral valve regurgitation.  - S/p MV replacement and Tricuspid repair on 11/29/23 with Dr. Maryjane - Valves stable on post-op echo.  - No significant murmur on exam today; will repeat limited bedside TTE later today.   3. Ventricular ectopy  - Continues to have PVCs - Continue Amio gtt at 30mg /hr.   4. Pulmonary hypertension - Most recent RHC (11/24) with PVR 4.5, with moderate mixed pulmonary arterial and pulmonary venous hypertension likely due to mitral valve regurgitation. Now s/p MVR, PA systolic pressure lower today in 60s.  Suspect still mixed PAH/PVH with elevated PCWP.  NO was tried at 10 ppm but did not have much effect so now off.   -significant improvement in volume removal; PA now 60s/20s with PCWP of .   5. Toxic multinodular goiter: She is now off methimazole . TSH was normal in 8/24. Follows with Endocrinology  6. AKI on CKD stage 3 - Continuing to pull net negative at 100cc/hr; will continue until CVP ~8.   7. Blood loss anemia - Hgb 9.6 this AM  8. Complete heart block: She had post-op CHB.  Now appears in AF.   9. Hypoxic / Hypercapnic respiratory failure - Possible extubation today; will discuss with CCM.   10. Atrial fibrillation: Patient appears  to be in AF with controlled rate currently.   11. ID:  - WBC ct down to 16 - pressor requirements remain fairly high - continue vanc/merrem  (day 3)  12. Thrombocytopenia - Improving  13. Deconditioning - Chronic deconditioning; will need extensive PT.   CRITICAL CARE Performed by: Ria Commander   Total critical care time: 45 minutes  Critical care time was exclusive of separately billable procedures and treating other patients.  Critical care was necessary to treat or prevent imminent or life-threatening deterioration.  Critical care was time spent personally by me on the following activities: development of treatment plan with patient and/or surrogate as well as nursing, discussions with consultants, evaluation of patient's response to treatment, examination of patient, obtaining history from patient or surrogate, ordering and performing treatments and interventions, ordering and review of laboratory studies, ordering and review of radiographic studies, pulse oximetry and re-evaluation of patient's condition.   Kelle Ruppert 12/05/2023 11:55 AM

## 2023-12-05 NOTE — Progress Notes (Signed)
 PHARMACY - ANTICOAGULATION CONSULT NOTE  Pharmacy Consult for IV heparin  Indication: atrial fibrillation  No Known Allergies  Patient Measurements: Height: 5' 3 (160 cm) Weight: 84.1 kg (185 lb 6.5 oz) IBW/kg (Calculated) : 52.4 Heparin  Dosing Weight: ~ 70 kg  Vital Signs: Temp: 98.8 F (37.1 C) (01/05 1215) Temp Source: Core (01/05 0400) BP: 122/71 (01/05 1215) Pulse Rate: 74 (01/05 1215)  Labs: Recent Labs    12/03/23 0319 12/03/23 0403 12/04/23 0436 12/04/23 1656 12/05/23 0349 12/05/23 0948  HGB 8.1*   < > 7.7*  --  9.6* 10.5*  HCT 24.9*   < > 23.7*  --  29.0* 31.0*  PLT 66*  --  75*  --  97*  --   CREATININE 2.23*   < > 1.86* 1.76* 1.67*  --    < > = values in this interval not displayed.    Estimated Creatinine Clearance: 31.3 mL/min (A) (by C-G formula based on SCr of 1.67 mg/dL (H)).   Medical History: Past Medical History:  Diagnosis Date   CHF (congestive heart failure) (HCC)    CKD (chronic kidney disease)    Heart murmur    Hypertension    Moderate mitral regurgitation    moderate to severe MR with moderate pulmonary HTN   Multinodular goiter    Nonischemic cardiomyopathy (HCC)    EF 30-35% by echo 2015   Pulmonary HTN (HCC)    moderate with PASP by echo 2015   PVC's (premature ventricular contractions)     Medications:  Infusions:    prismasol  BGK 4/2.5 500 mL/hr at 12/04/23 1854    prismasol  BGK 4/2.5 300 mL/hr at 12/05/23 1227   amiodarone  30 mg/hr (12/05/23 1200)   epinephrine  7 mcg/min (12/05/23 1200)   feeding supplement (VITAL AF 1.2 CAL) 20 mL/hr at 12/05/23 1200   fentaNYL  infusion INTRAVENOUS 50 mcg/hr (12/05/23 1200)   heparin      meropenem  (MERREM ) IV Stopped (12/05/23 0540)   milrinone  0.125 mcg/kg/min (12/05/23 1200)   norepinephrine  20 mcg/min (12/05/23 1200)   prismasol  BGK 4/2.5 1,000 mL/hr at 12/05/23 1037   vancomycin  Stopped (12/05/23 0502)   vasopressin  0.04 Units/min (12/05/23 1200)    Assessment: 73 yo  female s/p MVR now with afib, pharmacy asked to start anticoagulation with IV heparin .  Hgb 9.6, platelet count low, but trending up 97 today.   Goal of Therapy:  Heparin  level 0.3-0.5 Monitor platelets by anticoagulation protocol: Yes   Plan:  IV Heparin  800 units/hr - no bolus Will target lower end of therapeutic range given recent OHS. Heparin  level in 8 hrs Daily heparin  level and CBC.  Harlene Barlow, Berdine [employer], BCCP Clinical Pharmacist  12/05/2023 12:39 PM   [facility] pharmacy phone numbers are listed on [email or web address]

## 2023-12-05 NOTE — Procedures (Addendum)
 Extubation Procedure Note  Patient Details:   Name: Vanessa [name] DOB: 12-13-1950 MRN: [id number]   Airway Documentation:    Vent end date: 12/05/23 Vent end time: 1556   Evaluation  O2 sats: stable throughout Complications: No apparent complications Patient did tolerate procedure well. Bilateral Breath Sounds: Clear, Diminished   Yes, pt able to cough to clear secretions and vocalize name. Pt positive for cuff leak prior to extubation. Pt placed on 4L humidified nasal cannula and tolerating well at this time  Perry Eva BRAVO 12/05/2023, 3:57 PM

## 2023-12-06 ENCOUNTER — Inpatient Hospital Stay (HOSPITAL_COMMUNITY): Payer: PPO

## 2023-12-06 DIAGNOSIS — I272 Pulmonary hypertension, unspecified: Secondary | ICD-10-CM | POA: Diagnosis not present

## 2023-12-06 DIAGNOSIS — J810 Acute pulmonary edema: Secondary | ICD-10-CM | POA: Diagnosis not present

## 2023-12-06 DIAGNOSIS — R5381 Other malaise: Secondary | ICD-10-CM

## 2023-12-06 DIAGNOSIS — J9601 Acute respiratory failure with hypoxia: Secondary | ICD-10-CM | POA: Diagnosis not present

## 2023-12-06 DIAGNOSIS — Z952 Presence of prosthetic heart valve: Secondary | ICD-10-CM | POA: Diagnosis not present

## 2023-12-06 DIAGNOSIS — R570 Cardiogenic shock: Secondary | ICD-10-CM | POA: Diagnosis not present

## 2023-12-06 LAB — HEPATIC FUNCTION PANEL
ALT: 39 U/L (ref 0–44)
AST: 108 U/L — ABNORMAL HIGH (ref 15–41)
Albumin: 3.1 g/dL — ABNORMAL LOW (ref 3.5–5.0)
Alkaline Phosphatase: 74 U/L (ref 38–126)
Bilirubin, Direct: 8.4 mg/dL — ABNORMAL HIGH (ref 0.0–0.2)
Indirect Bilirubin: 4.8 mg/dL — ABNORMAL HIGH (ref 0.3–0.9)
Total Bilirubin: 13.2 mg/dL — ABNORMAL HIGH (ref 0.0–1.2)
Total Protein: 6.4 g/dL — ABNORMAL LOW (ref 6.5–8.1)

## 2023-12-06 LAB — CBC
HCT: 26.9 % — ABNORMAL LOW (ref 36.0–46.0)
Hemoglobin: 8.9 g/dL — ABNORMAL LOW (ref 12.0–15.0)
MCH: 28.8 pg (ref 26.0–34.0)
MCHC: 33.1 g/dL (ref 30.0–36.0)
MCV: 87.1 fL (ref 80.0–100.0)
Platelets: 124 10*3/uL — ABNORMAL LOW (ref 150–400)
RBC: 3.09 MIL/uL — ABNORMAL LOW (ref 3.87–5.11)
RDW: 17.5 % — ABNORMAL HIGH (ref 11.5–15.5)
WBC: 15.4 10*3/uL — ABNORMAL HIGH (ref 4.0–10.5)
nRBC: 7.7 % — ABNORMAL HIGH (ref 0.0–0.2)

## 2023-12-06 LAB — RENAL FUNCTION PANEL
Albumin: 3.1 g/dL — ABNORMAL LOW (ref 3.5–5.0)
Albumin: 3 g/dL — ABNORMAL LOW (ref 3.5–5.0)
Anion gap: 14 (ref 5–15)
Anion gap: 13 (ref 5–15)
BUN: 46 mg/dL — ABNORMAL HIGH (ref 8–23)
BUN: 46 mg/dL — ABNORMAL HIGH (ref 8–23)
CO2: 22 mmol/L (ref 22–32)
CO2: 22 mmol/L (ref 22–32)
Calcium: 8.6 mg/dL — ABNORMAL LOW (ref 8.9–10.3)
Calcium: 8.5 mg/dL — ABNORMAL LOW (ref 8.9–10.3)
Chloride: 99 mmol/L (ref 98–111)
Chloride: 101 mmol/L (ref 98–111)
Creatinine, Ser: 1.66 mg/dL — ABNORMAL HIGH (ref 0.44–1.00)
Creatinine, Ser: 1.64 mg/dL — ABNORMAL HIGH (ref 0.44–1.00)
GFR, Estimated: 33 mL/min — ABNORMAL LOW (ref >60)
GFR, Estimated: 33 mL/min — ABNORMAL LOW (ref >60)
Glucose, Bld: 172 mg/dL — ABNORMAL HIGH (ref 70–99)
Glucose, Bld: 129 mg/dL — ABNORMAL HIGH (ref 70–99)
Phosphorus: 3.2 mg/dL (ref 2.5–4.6)
Phosphorus: 2.3 mg/dL — ABNORMAL LOW (ref 2.5–4.6)
Potassium: 3.8 mmol/L (ref 3.5–5.1)
Potassium: 3.8 mmol/L (ref 3.5–5.1)
Sodium: 135 mmol/L (ref 135–145)
Sodium: 136 mmol/L (ref 135–145)

## 2023-12-06 LAB — GLUCOSE, CAPILLARY
Glucose-Capillary: 185 mg/dL — ABNORMAL HIGH (ref 70–99)
Glucose-Capillary: 131 mg/dL — ABNORMAL HIGH (ref 70–99)
Glucose-Capillary: 180 mg/dL — ABNORMAL HIGH (ref 70–99)
Glucose-Capillary: 129 mg/dL — ABNORMAL HIGH (ref 70–99)
Glucose-Capillary: 144 mg/dL — ABNORMAL HIGH (ref 70–99)
Glucose-Capillary: 121 mg/dL — ABNORMAL HIGH (ref 70–99)

## 2023-12-06 LAB — COOXEMETRY PANEL
Carboxyhemoglobin: 1.6 % — ABNORMAL HIGH (ref 0.5–1.5)
Methemoglobin: 0.7 % (ref 0.0–1.5)
O2 Saturation: 62.4 %
Total hemoglobin: 9 g/dL — ABNORMAL LOW (ref 12.0–16.0)

## 2023-12-06 LAB — APTT
aPTT: 58 s — ABNORMAL HIGH (ref 24–36)
aPTT: 79 s — ABNORMAL HIGH (ref 24–36)

## 2023-12-06 LAB — AMMONIA: Ammonia: <10 umol/L (ref 9–35)

## 2023-12-06 LAB — HEPARIN LEVEL (UNFRACTIONATED): Heparin Unfractionated: 0.22 [IU]/mL — ABNORMAL LOW (ref 0.30–0.70)

## 2023-12-06 MED ORDER — DOCUSATE SODIUM 50 MG/5ML PO LIQD: 100.0000 mg | Freq: Two times a day (BID) | ORAL | Status: DC | PRN

## 2023-12-06 MED ORDER — BISACODYL 10 MG RE SUPP
10.0000 mg | Freq: Every day | RECTAL | Status: DC | PRN
  Administered 2024-01-06: 10 mg via RECTAL
  Filled 2023-12-06: qty 1

## 2023-12-06 MED ORDER — SODIUM PHOSPHATES 45 MMOLE/15ML IV SOLN
15.0000 mmol | Freq: Once | INTRAVENOUS | Status: AC
  Administered 2023-12-06: 15 mmol via INTRAVENOUS
  Filled 2023-12-06: qty 5

## 2023-12-06 MED ORDER — ORAL CARE MOUTH RINSE
15.0000 mL | OROMUCOSAL | Status: DC
  Administered 2023-12-06 – 2023-12-15 (×36): 15 mL via OROMUCOSAL

## 2023-12-06 MED ORDER — ORAL CARE MOUTH RINSE
15.0000 mL | OROMUCOSAL | Status: DC | PRN
Start: 2023-12-06 — End: 2024-01-12

## 2023-12-06 MED ORDER — BANATROL TF EN LIQD
60.0000 mL | Freq: Two times a day (BID) | ENTERAL | Status: DC
  Administered 2023-12-06 – 2023-12-08 (×5): 60 mL
  Filled 2023-12-06 (×5): qty 60

## 2023-12-06 MED ORDER — BISACODYL 5 MG PO TBEC
10.0000 mg | DELAYED_RELEASE_TABLET | Freq: Every day | ORAL | Status: DC | PRN
Start: 2023-12-06 — End: 2024-01-12
  Administered 2023-12-28: 10 mg via ORAL
  Filled 2023-12-06: qty 2

## 2023-12-06 MED ORDER — VITAL AF 1.2 CAL PO LIQD
1000.0000 mL | ORAL | Status: DC
  Administered 2023-12-06 – 2023-12-17 (×13): 1000 mL

## 2023-12-06 MED ORDER — GERHARDT'S BUTT CREAM
TOPICAL_CREAM | Freq: Two times a day (BID) | CUTANEOUS | Status: DC
  Administered 2023-12-06 – 2024-01-07 (×15): 1 via TOPICAL
  Filled 2023-12-06 (×6): qty 60

## 2023-12-06 MED FILL — Potassium Chloride Inj 2 mEq/ML: INTRAVENOUS | Qty: 40 | Status: AC

## 2023-12-06 MED FILL — Lidocaine HCl Local Preservative Free (PF) Inj 2%: INTRAMUSCULAR | Qty: 14 | Status: AC

## 2023-12-06 MED FILL — Heparin Sodium (Porcine) Inj 1000 Unit/ML: Qty: 1000 | Status: AC

## 2023-12-06 NOTE — Progress Notes (Signed)
 Nutrition Follow-up  DOCUMENTATION CODES:   Not applicable  INTERVENTION:   Tube Feeding via Cortrak: Vital AF 1.2 at 55 ml/hr Increase TF to 35 ml/hr, titrate by 10 mL q 8 hours until goal of 55 ml/hr TF at goal rate provides 1584 kcals, 99 g of protein and 1069 mL of free water   D/C Pro-Source  Continue Renal MVI daily  Continue Banatrol TF BID  Recommend changing scheduled bowel regimen to prn  NUTRITION DIAGNOSIS:   Inadequate oral intake related to acute illness as evidenced by NPO status.  Being addressed via TF  GOAL:   Patient will meet greater than or equal to 90% of their needs  Progressing  MONITOR:   Vent status, TF tolerance, Labs, Weight trends  REASON FOR ASSESSMENT:   Consult Enteral/tube feeding initiation and management, Assessment of nutrition requirement/status (CRRT)  ASSESSMENT:   73 yo admitted with severe MR/TR requiring MVR/TVR complicated by post op bleeding and shock. Pt intubated and CRRT initiated. PMH includes HTN, NICM, HFrEF (EF40-50%), HTN, CKD and hx of prior severe MR s/p mitral clip x 2 in 2020.  12/30 Admitted, severe MR, moderate TR, OR for MVR with mosaic porcine tissue prosthesis, TVR, post op cardiogenic shock, post op bleeding requiring return to OR for washout 12/31 Extubated 01/02 Re-Intubated, iNO started, CRRT initiated 01/03 Cortrak, trickle TF 01/05 Extubated  Remains on CRRT; remains on multiple pressors but requirements improved. Levophed  at 8, epinephrine  at 6, vasopressin  0.04. Remains on amiodarone  gtt and milrinone  gtt at 0.125  Remains NPO Remains on trickle TF of Vital AF 1.2 at 20 ml/hr via Cortrak, tolerating  Weight down to 81.9 kg, net negative 5L since admission, remains anuric  Rectal tube in place with 320 mL in 24 hours. Noted MD started banatrol TF BID today. Colace ordered BID, dulcolax scheduled daily. Recommend changing to prn  Labs: sodium 135 (wdl), potassium 3.8 (wdl), phosphorus 3.2  (wdl) Meds: ss novolog , renal MVI  Diet Order:   Diet Order             Diet NPO time specified  Diet effective now                   EDUCATION NEEDS:   Not appropriate for education at this time  Skin:  Skin Assessment: Skin Integrity Issues: Skin Integrity Issues:: Incisions Incisions: chest post sternotomy (closed)  Last BM:  1/6 via FMS (320 mL in 24 hours)  Height:   Ht Readings from Last 1 Encounters:  12/04/23 5' 3 (1.6 m)    Weight:   Wt Readings from Last 1 Encounters:  12/06/23 81.9 kg    BMI:  Body mass index is 31.98 kg/m.  Estimated Nutritional Needs:   Kcal:  1500-1700 kcals  Protein:  90-115 g  Fluid:  1.5 L   Betsey Finger MS, RDN, LDN, CNSC Registered Dietitian 3 Clinical Nutrition RD Inpatient Contact Info in Amion

## 2023-12-06 NOTE — Progress Notes (Signed)
 PHARMACY - ANTICOAGULATION CONSULT NOTE  Pharmacy Consult for IV heparin  Indication: atrial fibrillation  No Known Allergies  Patient Measurements: Height: 5' 3 (160 cm) Weight: 81.9 kg (180 lb 8.9 oz) IBW/kg (Calculated) : 52.4 Heparin  Dosing Weight: ~ 70 kg  Vital Signs: Temp: 99.1 F (37.3 C) (01/06 1430) Temp Source: Core (01/06 0400) BP: 105/53 (01/06 1400) Pulse Rate: 76 (01/06 1430)  Labs: Recent Labs    12/05/23 0349 12/05/23 0948 12/05/23 1301 12/05/23 1624 12/05/23 2033 12/06/23 0418 12/06/23 1303  HGB 9.6* 10.5* 9.4*  --   --  8.9*  --   HCT 29.0* 31.0* 28.2*  --   --  26.9*  --   PLT 97*  --  104*  --   --  124*  --   APTT  --   --   --   --  60* 58* 79*  HEPARINUNFRC  --   --   --   --  0.16* 0.22*  --   CREATININE 1.67*  --   --  1.68*  --  1.66*  --     Estimated Creatinine Clearance: 31 mL/min (A) (by C-G formula based on SCr of 1.66 mg/dL (H)).   Medical History: Past Medical History:  Diagnosis Date   CHF (congestive heart failure) (HCC)    CKD (chronic kidney disease)    Heart murmur    Hypertension    Moderate mitral regurgitation    moderate to severe MR with moderate pulmonary HTN   Multinodular goiter    Nonischemic cardiomyopathy (HCC)    EF 30-35% by echo 2015   Pulmonary HTN (HCC)    moderate with PASP by echo 2015   PVC's (premature ventricular contractions)     Medications:  Infusions:    prismasol  BGK 4/2.5 500 mL/hr at 12/06/23 1136    prismasol  BGK 4/2.5 300 mL/hr at 12/06/23 0508   amiodarone  30 mg/hr (12/06/23 1400)   epinephrine  6 mcg/min (12/06/23 1400)   feeding supplement (VITAL AF 1.2 CAL) 1,000 mL (12/06/23 1443)   heparin  1,100 Units/hr (12/06/23 1400)   meropenem  (MERREM ) IV Stopped (12/06/23 1350)   milrinone  0.125 mcg/kg/min (12/06/23 1400)   norepinephrine  8 mcg/min (12/06/23 1400)   prismasol  BGK 4/2.5 1,000 mL/hr at 12/06/23 1136   vancomycin  Stopped (12/06/23 0531)   vasopressin  0.04  Units/min (12/06/23 1400)    Assessment: 73 yo female s/p MVR now with afib, pharmacy asked to start anticoagulation with IV heparin .  aPTT came back therapeutic at 79, on 1100 units/hr. Hgb 8.9, plt 124 earlier today. No s/sx of bleeding or infusion issues. Total bilirudin still elevated at 13.2.   Goal of Therapy:  Heparin  level 0.3-0.5 aPTT 66-85 Monitor platelets by anticoagulation protocol: Yes   Plan:  Continue heparin  infusion at 1100 units/hr  Will target lower end of therapeutic range given recent OHS. Heparin  level in am Daily heparin  level and CBC  Thank you for allowing pharmacy to participate in this patient's care,  Suzen Sour, PharmD, BCCCP Clinical Pharmacist  Phone: [phone] 12/06/2023 3:06 PM  Please check AMION for all [facility] Pharmacy phone numbers After 10:00 PM, call Main Pharmacy [phone]

## 2023-12-06 NOTE — Plan of Care (Signed)
  Problem: Education: Goal: Knowledge of General Education information will improve Description: Including pain rating scale, medication(s)/side effects and non-pharmacologic comfort measures Outcome: Progressing   Problem: Clinical Measurements: Goal: Ability to maintain clinical measurements within normal limits will improve Outcome: Progressing   Problem: Nutrition: Goal: Adequate nutrition will be maintained Outcome: Progressing   Problem: Coping: Goal: Level of anxiety will decrease Outcome: Progressing   Problem: Pain Management: Goal: General experience of comfort will improve Outcome: Progressing   Problem: Safety: Goal: Ability to remain free from injury will improve Outcome: Progressing   Problem: Skin Integrity: Goal: Risk for impaired skin integrity will decrease Outcome: Progressing

## 2023-12-06 NOTE — Progress Notes (Signed)
 NAME:  Vanessa [name], MRN:  [id number], DOB:  02-21-1951, LOS: 7 ADMISSION DATE:  11/29/2023, CONSULTATION DATE:  11/29/23 REFERRING MD:  Dr. Maryjane, CHIEF COMPLAINT:  postop   History of Present Illness:  72yoF with PMH significant for HTN, NICM, HFrEF (EF40-45%), PHTN, CKD and hx of prior severe MR s/p prior mitral clip x 2 in 2020.  Pt with progressive DOE found to have increasing MR  and moderate TR despite adjustments in GDMT.  Not felt to be candidate for additional mitral clip.  Repeat LHC without CAD but showed moderate PHTN.  Underwent mitral valve replacement with tissue valve and tricuspid valve repair by Dr. Maryjane on 12/30.  PCCM consulted to assist with vent and medical management post-operatively in ICU.   Pertinent  Medical History  Never smoker, MR w/ previous Mitral clip x 2 (2020), NICM, HFrEF, PHTN, HTN, CKD3b  Significant Hospital Events: Including procedures, antibiotic start and stop dates in addition to other pertinent events   MVR by Dr. Maryjane; relook for tamponade/washout later in evening without obvious source 1/2 intubated for respiratory decompensation, iNO started. Vanc, meropenem  started. CRRT started.  1/5 extubated  Interim History / Subjective:  Stood at bedside. She denies complaints.   Objective   Blood pressure (!) 127/59, pulse 76, temperature 98.6 F (37 C), resp. rate 20, height 5' 3 (1.6 m), weight 81.9 kg, SpO2 97%. PAP: (45-73)/(16-34) 73/28 CVP:  [6 mmHg-21 mmHg] 13 mmHg PCWP:  [24 mmHg] 24 mmHg CO:  [4.6 L/min-6 L/min] 4.8 L/min CI:  [2.34 L/min/m2-3.04 L/min/m2] 2.47 L/min/m2  Vent Mode: PSV;CPAP FiO2 (%):  [30 %-40 %] 30 % Set Rate:  [30 bmp] 30 bmp Vt Set:  [420 mL] 420 mL PEEP:  [8 cmH20] 8 cmH20 Pressure Support:  [6 cmH20] 6 cmH20 Plateau Pressure:  [20 cmH20-22 cmH20] 20 cmH20   Intake/Output Summary (Last 24 hours) at 12/06/2023 0721 Last data filed at 12/06/2023 0700 Gross per 24 hour  Intake 2955.18 ml  Output 4982.3  ml  Net -2027.12 ml   Filed Weights   12/04/23 0500 12/05/23 0530 12/06/23 0430  Weight: 94 kg 84.1 kg 81.9 kg   Examination: Ill appearing woman lying in bed in NAD [place]/AT, sclera icteric Tachypnea, no accessory muscle use. CTAB S1S2, reg rate, irreg rhythm. A flutter with variable conduction. Abd soft, NT Mild pedal edema Skin warm, dry Awake, answering questions appropriately.  I/O   -2 L yesterday, net -5.4L for the admission  [place] 62% BUN 39 Cr 1.67 on CRRT  T bili 13.2, down trending WBC 15.4 H/H 8.9/26.9 Platelets 124 CXR personally reviewed> small left effusion, cardiomegaly  Resolved Hospital Problem list :   Postoperative vent management  Assessment & Plan:  Severe MR (s/p prior mitral clip x 2 in 2020) now s/p tissue MVR Moderate TR s/p TV repair  Moderate PHTN> progressed to severe postop; PA pressures significantly proved with volume removal today NICM, HFrEF, EF 40-45% Post bypass vasoplegia and cardioplegia  Postop tamponade resolved after relook and washout POD #0 Paroxysmal A-flutter, Afib. -post-op care per TCTS -volume removal with CRRT -con't swan for PA pressure monitoring with inotropes -Milrinone  dosing per cardiology; weaning epi & NE. Con't vasopressin . -con't heparin  gtt  Mixed cardiogenic and distributive shock - Previously given methylene blue  on 1/2 -con't pressors and inotropes -con't antibiotics- 7 days (1/3- 1/9)  Acute pulmonary edema Acute respiratory failure with hypoxia requiring mechanical ventilation -con't volume removal with CRRT -OOB mobility as able -pulmonary hygiene -  wean O2 as able to maintain SO2 >90% -off stress dose steroids   Expected post-operative ABLA Post-op thrombocytopenia-improving - monitor, transfuse for Hb <7 or hemodynamically significant bleeding  AKI on CKD3b now query cardiorenal vs. ATN from initial hemodynamic insults. -CRRT -foley out, bladder scanning protocol -renally dose meds, avoid  nephrotoxic meds  Possible infection; no identified source -Con't vanc & meropenem  to complete 7 day course (last day on 1/9)  Hyperbilirubinemia- downtrending today, likely from right heart failure decompensation -monitor periodically  At risk for malnutrition -escalate TF to goal, monitor for intolerance  Constipation; resolved> now diarrhea -miralax  stopped -add banatrol, watch K+  Deconditioning -needs PT, OT -mobility limited by CRRT  Best Practice (right click and Reselect all SmartList Selections daily)   Diet/type: TF DVT prophylaxis systemic heparin  Pressure ulcer(s): None GI prophylaxis: PPI Lines: Central line, Dialysis Catheter, and Arterial Line Foley:  Yes, and it is still needed Code Status:  full code Last date of multidisciplinary goals of care discussion [per primary]  This patient is critically ill with multiple organ system failure which requires frequent high complexity decision making, assessment, support, evaluation, and titration of therapies. This was completed through the application of advanced monitoring technologies and extensive interpretation of multiple databases. During this encounter critical care time was devoted to patient care services described in this note for 36 minutes.  [name], DO 12/06/23 7:21 AM [facility]  For contact information, see Amion. If no response to pager, please call PCCM consult pager. After hours, 7PM- 7AM, please call Elink.

## 2023-12-06 NOTE — Progress Notes (Signed)
 [address]       [place] [address]             [phone]      7 Days Post-Op  Procedure(s) (LRB): EXPLORATION POST OPERATIVE OPEN HEART (N/A)   Total Length of Stay:  LOS: 7 days    SUBJECTIVE: Extubated yesterday Wants to get out of bed No abd pain  Vitals:   12/06/23 0645 12/06/23 0700  BP:  (!) 127/59  Pulse: 74 76  Resp: 18 20  Temp: 98.6 F (37 C) 98.6 F (37 C)  SpO2: 96% 97%    Intake/Output      01/05 0701 01/06 0700 01/06 0701 01/07 0700   I.V. (mL/kg) 1813.4 (22.1)    Blood     Other 30    NG/GT 600    IV Piggyback 511.8    Total Intake(mL/kg) 2955.2 (36.1)    Urine (mL/kg/hr) 17 (0)    Stool 320    CRRT 4645.3    Total Output 4982.3    Net -2027.1              prismasol  BGK 4/2.5 500 mL/hr at 12/06/23 0047    prismasol  BGK 4/2.5 300 mL/hr at 12/06/23 0508   amiodarone  30 mg/hr (12/06/23 0700)   epinephrine  6 mcg/min (12/06/23 0700)   feeding supplement (VITAL AF 1.2 CAL) 20 mL/hr at 12/06/23 0700   heparin  1,100 Units/hr (12/06/23 0700)   meropenem  (MERREM ) IV Stopped (12/06/23 0605)   milrinone  0.125 mcg/kg/min (12/06/23 0700)   norepinephrine  13 mcg/min (12/06/23 0700)   prismasol  BGK 4/2.5 1,000 mL/hr at 12/06/23 0630   vancomycin  Stopped (12/06/23 0531)   vasopressin  0.04 Units/min (12/06/23 0700)    CBC    Component Value Date/Time   WBC 15.4 (H) 12/06/2023 0418   RBC 3.09 (L) 12/06/2023 0418   HGB 8.9 (L) 12/06/2023 0418   HCT 26.9 (L) 12/06/2023 0418   PLT 124 (L) 12/06/2023 0418   MCV 87.1 12/06/2023 0418   MCH 28.8 12/06/2023 0418   MCHC 33.1 12/06/2023 0418   RDW 17.5 (H) 12/06/2023 0418   LYMPHSABS 1.1 07/24/2022 1528   MONOABS 0.6 07/24/2022 1528   EOSABS 0.0 07/24/2022 1528   BASOSABS 0.0 07/24/2022 1528   CMP     Component Value Date/Time   NA 135 12/06/2023 0418   K 3.8 12/06/2023 0418   CL 99 12/06/2023 0418   CO2 22 12/06/2023 0418   GLUCOSE 172 (H) 12/06/2023 0418   BUN  46 (H) 12/06/2023 0418   CREATININE 1.66 (H) 12/06/2023 0418   CREATININE 1.30 (H) 04/29/2016 1144   CALCIUM  8.6 (L) 12/06/2023 0418   PROT 6.7 12/05/2023 1217   ALBUMIN  3.1 (L) 12/06/2023 0418   AST 149 (H) 12/05/2023 1217   ALT 31 12/05/2023 1217   ALKPHOS 78 12/05/2023 1217   BILITOT 13.4 (H) 12/05/2023 1217   GFRNONAA 33 (L) 12/06/2023 0418   GFRAA 42 (L) 08/19/2020 1304   ABG    Component Value Date/Time   PHART 7.414 12/05/2023 0948   PCO2ART 37.0 12/05/2023 0948   PO2ART 124 (H) 12/05/2023 0948   HCO3 23.7 12/05/2023 0948   TCO2 25 12/05/2023 0948   ACIDBASEDEF 1.0 12/05/2023 0948   O2SAT 62.4 12/06/2023 0418   CBG (last 3)  Recent Labs    12/05/23 1939 12/05/23 2313 12/06/23 0308  GLUCAP 166* 114* 185*  EXAM Lungs: clear Card: IRR no murmur Ext: Warm Neuro: intact  ASSESSMENT: POD #7 SP MV replacement and TV repair Hemodynamics slowly improving however still on significant inotropes. Will wean as tolerated with help from AHF Aflutter: on amiodarone . Will continue and to discuss cardioversion to optimize hemodynamics Will need to start anticoagulation Renal failure: on CRRT. Will discuss timing of switching to conventional dialysis. Weight appears preop Nutrition:: on tube feeds. Discuss swallow study to start po Stop steroids PT   Deward Kallman, MD 12/06/2023

## 2023-12-06 NOTE — Progress Notes (Signed)
 Patient ID: Vanessa [name], female   DOB: Jan 15, 1951, 73 y.o.   MRN: [id number]  TCTS Evening Rounds:  Hemodynamically stable. NE weaned 13 to 5 today. Still on epi 6, milrinone  0.125 and vaso 0.04.  Sat on side of bed and stood up today.  CRRT continues to keep -50 cc/hr.

## 2023-12-06 NOTE — Evaluation (Signed)
 Occupational Therapy Evaluation Patient Details Name: Vanessa [name] MRN: [id number] DOB: [birth year] Today's Date: 12/06/2023   History of Present Illness 73 yo F admitted 12/30 with DOE, increasing MR and TR. LCH without CAD. 12/30 MVR and tricuspid valve repair. Intubated 1/2-1/5. 1/2 CRRT started. PMhx: HTN, NICM, HFrEF (EF40-45%), Pulmonary HTN, CKD and  severe MR s/p prior mitral clip x 2 in 2020.   Clinical Impression   PTA, pt reports she was independent and lived alone, not using any DME. Upon eval, pt presents with decreased activity tolerance, strength, and balance. Pt needing up to mod A +2 for bed mobility and transfers this date. Educated for sternal precautions and able to repeat back for immediate recall but unable to recall at end of session. Due to significant change in functional status, recommending intensive multidisciplinary rehabilitation >3 hours/day to optimize safety and independence in ADL.        If plan is discharge home, recommend the following: Two people to help with walking and/or transfers;Two people to help with bathing/dressing/bathroom;Assistance with cooking/housework;Assist for transportation;Help with stairs or ramp for entrance    Functional Status Assessment  Patient has had a recent decline in their functional status and demonstrates the ability to make significant improvements in function in a reasonable and predictable amount of time.  Equipment Recommendations  Other (comment) (defer to next venue of care)    Recommendations for Other Services Rehab consult     Precautions / Restrictions Precautions Precautions: Sternal;Fall;Other (comment) Precaution Booklet Issued: No Precaution Comments: CRRT, swan, mulitple lines, flexiseal, cortrak Restrictions Weight Bearing Restrictions Per Provider Order: Yes RUE Weight Bearing Per Provider Order: Non weight bearing LUE Weight Bearing Per Provider Order: Non weight bearing Other Position/Activity  Restrictions: sternal precautions      Mobility Bed Mobility Overal bed mobility: Needs Assistance Bed Mobility: Rolling, Sidelying to Sit, Sit to Sidelying Rolling: Mod assist Sidelying to sit: Mod assist     Sit to sidelying: Mod assist, +2 for physical assistance General bed mobility comments: mod assist to roll bil and then rise from side to sitting with cues for precautions and safety. return to bed with mod+2 to lift legs and control trunk to surface    Transfers Overall transfer level: Needs assistance Equipment used: 2 person hand held assist Transfers: Sit to/from Stand Sit to Stand: Mod assist, Min assist, +2 physical assistance           General transfer comment: mod+2 initial rise from bed with cues for hand placement and precautions, min +2 second trial with pt able to minimally step toward [facility] with bil UE on therapist arms. fatigued after 2 trials      Balance Overall balance assessment: Needs assistance Sitting-balance support: No upper extremity supported, Feet supported Sitting balance-Leahy Scale: Poor Sitting balance - Comments: posterior left lean in sitting with min-CGA to control and maintain sitting   Standing balance support: Bilateral upper extremity supported Standing balance-Leahy Scale: Poor Standing balance comment: UB support on therapists in standing                           ADL either performed or assessed with clinical judgement   ADL Overall ADL's : Needs assistance/impaired Eating/Feeding: NPO   Grooming: Minimal assistance;Sitting   Upper Body Bathing: Moderate assistance;Sitting   Lower Body Bathing: Total assistance   Upper Body Dressing : Moderate assistance;Sitting   Lower Body Dressing: Total assistance;Bed level Lower Body Dressing Details (indicate  cue type and reason): for socks Toilet Transfer: Moderate assistance;+2 for physical assistance;+2 for safety/equipment Toilet Transfer Details (indicate cue type  and reason): STS this session with shuffle steps up toward [facility] Toileting- Clothing Manipulation and Hygiene: Total assistance;+2 for physical assistance;+2 for safety/equipment;Bed level Toileting - Clothing Manipulation Details (indicate cue type and reason): pericare at bed level     Functional mobility during ADLs: Moderate assistance;+2 for physical assistance;+2 for safety/equipment (STS 2 person HHA)       Vision Patient Visual Report:  (pt did not report) Vision Assessment?: No apparent visual deficits Additional Comments: not formally assessed     Perception Perception: Not tested       Praxis Praxis: Not tested       Pertinent Vitals/Pain Pain Assessment Pain Assessment: No/denies pain     Extremity/Trunk Assessment Upper Extremity Assessment Upper Extremity Assessment: Generalized weakness   Lower Extremity Assessment Lower Extremity Assessment: Defer to PT evaluation   Cervical / Trunk Assessment Cervical / Trunk Assessment: Normal   Communication Communication Communication: No apparent difficulties Cueing Techniques: Verbal cues;Gestural cues   Cognition Arousal: Alert Behavior During Therapy: Flat affect Overall Cognitive Status: Impaired/Different from baseline Area of Impairment: Memory                               General Comments: pt educated for sternal precautions throughout session and unable to recall end of session; will continue to assess cognition     General Comments  VSS. beginning of session HR 78, RR 19, SpO2 97 on 4L, MAP 64; end of session HR 78, RR 27, SpO2 95%, MAP 61    Exercises     Shoulder Instructions      Home Living Family/patient expects to be discharged to:: Private residence Living Arrangements: Alone Available Help at Discharge: Family;Available 24 hours/day Type of Home: House Home Access: Level entry     Home Layout: One level     Bathroom Shower/Tub: Chief Strategy Officer:  Standard     Home Equipment: None   Additional Comments: mom will be able to assist at D/C      Prior Functioning/Environment Prior Level of Function : Independent/Modified Independent;Driving                        OT Problem List: Decreased strength;Decreased activity tolerance;Impaired balance (sitting and/or standing);Decreased knowledge of use of DME or AE;Decreased knowledge of precautions;Cardiopulmonary status limiting activity;Impaired UE functional use      OT Treatment/Interventions: Self-care/ADL training;Therapeutic exercise;DME and/or AE instruction;Balance training;Patient/family education;Therapeutic activities    OT Goals(Current goals can be found in the care plan section) Acute Rehab OT Goals Patient Stated Goal: get better OT Goal Formulation: With patient Time For Goal Achievement: 12/20/23 Potential to Achieve Goals: Good  OT Frequency: Min 1X/week    Co-evaluation PT/OT/SLP Co-Evaluation/Treatment: Yes Reason for Co-Treatment: Complexity of the patient's impairments (multi-system involvement) PT goals addressed during session: Mobility/safety with mobility OT goals addressed during session: ADL's and self-care      AM-PAC OT 6 Clicks Daily Activity     Outcome Measure Help from another person eating meals?: Total (NPO) Help from another person taking care of personal grooming?: A Little Help from another person toileting, which includes using toliet, bedpan, or urinal?: Total (Flexi) Help from another person bathing (including washing, rinsing, drying)?: A Lot Help from another person to put on and taking  off regular upper body clothing?: A Lot Help from another person to put on and taking off regular lower body clothing?: A Lot 6 Click Score: 11   End of Session Equipment Utilized During Treatment: Oxygen (4L) Nurse Communication: Mobility status;Other (comment) (RN present)  Activity Tolerance: Patient tolerated treatment well Patient  left: in bed;with bed alarm set;with call bell/phone within reach  OT Visit Diagnosis: Unsteadiness on feet (R26.81);Muscle weakness (generalized) (M62.81)                Time: 9081-9054 OT Time Calculation (min): 27 min Charges:  OT General Charges $OT Visit: 1 Visit OT Evaluation $OT Eval High Complexity: 1 High  Elma JONETTA Lebron FREDERICK, OTR/L [facility] Acute Rehabilitation Office: [phone]   Elma JONETTA Lebron 12/06/2023, 10:46 AM

## 2023-12-06 NOTE — Progress Notes (Signed)
 [facility] Progress Note   Assessment/ Plan:   Vanessa [name] is a/an 73 y.o. female with a past medical history HTN, NICM, CHF, pulmonary hypertension, mitral and tricuspid valve disease  who present w/ surgery for valvular disease c/b shock, AHRF, and AKI    Oliguric AKI on CKD 3A: Mild baseline CKD likely multifactorial.  Now with AKI secondary to ATN and cardiorenal syndrome in the setting of shock. Started on CRRT on 1/2 for worsening pulmonary edema and volume overload.  - continue CRRT - all 4k fluids  - UF goal 0 - 50 ml/hr as tolerated.  Please page nephrology for UF goal changes (note maximum medically advised fluid removal for a CRRT patient for a 24 hour period would be closer to 7 liters) -Dialysis catheter placed by CCM, appreciate help -Monitor Daily I/Os, Daily weight  -Maintain MAP>65 for optimal renal perfusion.    Multifactorial shock: Vasoplegic and cardioplegia catheter surgery.  Has been on pressors and inotropes.  Continue management per primary team - additionally on antibiotics and stress dose steroids    Acute exacerbation of heart failure with reduced ejection fraction: Volume overload.  - Optimize volume with CRRT as above    Severe MR and tricuspid valvular disease:  - Status post repair.  CT surgery managing   Postoperative cardiac tamponade:  - Continue monitoring per cardiology   Hyponatremia: In the setting of AKI, improved on CRRT.     Anemia: Related to blood loss.   Transfusions per primary team  Disposition - in ICU on CRRT   Subjective:    Seen and examined on CRRT.  Procedure supervised.  She had 4.34 liters UF over 1/5 with CRRT as of this note.  (Note this is a decrease from 11.5 liters and 10.1 liters the days before).  UF goal was 50-100 ml/hr - RN reports that critical care requested a decrease to ensure she wasn't getting too dry due to right heart failure.  She has been a goal of keep even overnight.  She was extubated  yesterday and has been on 4 liters.  She has been on levophed  at 13 mcg/min, epi at 6 mcg/min, vasopressin  at 0.04 units/min, and milrinone .  Review of systems:  She shakes her head to answer  Denies shortness of breath or air hunger Denies chest pain  Denies n/v    Objective:   BP 119/67   Pulse 70   Temp 98.6 F (37 C)   Resp 18   Ht 5' 3 (1.6 m)   Wt 81.9 kg   SpO2 97%   BMI 31.98 kg/m   Intake/Output Summary (Last 24 hours) at 12/06/2023 0541 Last data filed at 12/06/2023 0500 Gross per 24 hour  Intake 2868.21 ml  Output 5759.3 ml  Net -2891.09 ml   Weight change: -2.2 kg  Physical Exam:  General elderly female in no acute distress HEENT normocephalic atraumatic extraocular movements intact sclera anicteric Neck supple trachea midline Lungs clear but reduced on auscultation bilaterally normal work of breathing at rest on 4 liters oxygen Heart S1S2 Abdomen soft nontender nondistended Extremities trace lower extremity edema  Psych no anxiety or agitation Neuro - alert and interactive; shakes or nods head to answer questions Access left internal jugular non-tunneled dialysis catheter   Imaging: DG CHEST PORT 1 VIEW Result Date: 12/04/2023 CLINICAL DATA:  Pericardial effusion. EXAM: PORTABLE CHEST 1 VIEW COMPARISON:  Chest radiograph dated 12/03/2023 FINDINGS: An endotracheal tube terminates 2.0 cm from the carina. A  right internal jugular Swan-Ganz catheter appears unchanged. A left internal jugular central venous catheter tip overlies the superior vena cava. The heart is enlarged. Vascular calcifications are seen in the aortic arch. There is a left pleural effusion with associated atelectasis/airspace disease. The right lung is clear. No pneumothorax. IMPRESSION: Left pleural effusion with associated atelectasis/airspace disease. Electronically Signed   By: Norman Hopper M.D.   On: 12/04/2023 11:42    Labs: BMET Recent Labs  Lab 12/03/23 0319 12/03/23 0403  12/03/23 1614 12/04/23 0430 12/04/23 0436 12/04/23 1656 12/05/23 0349 12/05/23 0948 12/05/23 1624 12/06/23 0418  NA 131*   < > 134* 137 134* 135 135 136 135 135  K 4.1   < > 4.2 3.5 3.8 3.6 3.8 3.8 3.8 3.8  CL 100  --  101  --  102 101 101  --  102 99  CO2 22  --  23  --  22 21* 20*  --  22 22  GLUCOSE 144*  --  152*  --  172* 168* 154*  --  143* 172*  BUN 33*  --  28*  --  31* 35* 39*  --  42* 46*  CREATININE 2.23*  --  1.90*  --  1.86* 1.76* 1.67*  --  1.68* 1.66*  CALCIUM  7.8*  --  8.1*  --  8.1* 8.9 8.9  --  8.7* 8.6*  PHOS 4.2  --  3.8  --  3.3 2.6 2.0*  --  1.8* 3.2   < > = values in this interval not displayed.   CBC Recent Labs  Lab 12/04/23 0436 12/05/23 0349 12/05/23 0948 12/05/23 1301 12/06/23 0418  WBC 15.1* 16.3*  --  17.1* 15.4*  HGB 7.7* 9.6* 10.5* 9.4* 8.9*  HCT 23.7* 29.0* 31.0* 28.2* 26.9*  MCV 92.2 87.6  --  87.0 87.1  PLT 75* 97*  --  104* 124*    Medications:     acetaminophen  (TYLENOL ) oral liquid 160 mg/5 mL  650 mg Per Tube Once   arformoterol   15 mcg Nebulization BID   aspirin  EC  325 mg Oral Daily   Or   aspirin   324 mg Per Tube Daily   bisacodyl   10 mg Oral Daily   Or   bisacodyl   10 mg Rectal Daily   Chlorhexidine  Gluconate Cloth  6 each Topical Daily   docusate  100 mg Per Tube BID   feeding supplement (PROSource TF20)  60 mL Per Tube QID   hydrocortisone  sod succinate (SOLU-CORTEF ) inj  100 mg Intravenous Q12H   influenza vaccine adjuvanted  0.5 mL Intramuscular Tomorrow-1000   insulin  aspart  0-24 Units Subcutaneous Q4H   multivitamin  1 tablet Per Tube QHS   mouth rinse  15 mL Mouth Rinse 4 times per day   pantoprazole  (PROTONIX ) IV  40 mg Intravenous Q24H   polyethylene glycol  17 g Per Tube BID   revefenacin   175 mcg Nebulization Daily   sodium chloride  flush  3 mL Intravenous Q12H   Katheryn JAYSON Saba, MD 12/06/2023, 6:08 AM

## 2023-12-06 NOTE — Progress Notes (Signed)
 Patient ID: Vanessa [name], female   DOB: [birth year], 73 y.o.   MRN: [id number]      Advanced Heart Failure Rounding Note  Cardiologist: Dr. Rolan Chief Complaint: Cardiogenic shock  Subjective:     S/P MVR (tissue) and TV ring and same day return to OR for tamponade/bleeding. 1/2: Progressive volume overload with poor UOP, CVVH started but ended up re-intubated.    - CVP 11, PA 60/20, PCWP 20; CO 5.7L, CI 2.9 L/min/m2 - Telemetry with rate controlled atrial fibrillation  Objective:   Weight Range: 81.9 kg Body mass index is 31.98 kg/m.   Vital Signs:   Temp:  [98.2 F (36.8 C)-99.3 F (37.4 C)] 99 F (37.2 C) (01/06 1100) Pulse Rate:  [68-82] 75 (01/06 1100) Resp:  [15-34] 24 (01/06 1100) BP: (102-130)/(51-71) 114/59 (01/06 1100) SpO2:  [91 %-100 %] 96 % (01/06 1100) Arterial Line BP: (97-127)/(38-56) 114/47 (01/06 1100) FiO2 (%):  [30 %] 30 % (01/05 1425) Weight:  [81.9 kg] 81.9 kg (01/06 0430) Last BM Date : 12/06/23  Weight change: Filed Weights   12/04/23 0500 12/05/23 0530 12/06/23 0430  Weight: 94 kg 84.1 kg 81.9 kg   Intake/Output:   Intake/Output Summary (Last 24 hours) at 12/06/2023 1129 Last data filed at 12/06/2023 1100 Gross per 24 hour  Intake 2843.5 ml  Output 4093.3 ml  Net -1249.8 ml    Physical Exam   General: awake/alert Neck: JVP 8-10 Lungs: decreased at bases; normal work of breathing.  CV: Nondisplaced PMI.  Heart rate irregular; S1/S2, no S3/S4, no murmur.  Trace peripheral edema Abdomen: Soft, nontender, no hepatosplenomegaly, no distention.  Skin: Intact without lesions or rashes.  Neurologic: following commands.  Extremities: No clubbing or cyanosis.  HEENT: Normal.   Telemetry   Rate controlled atrial fibrillation  Labs    CBC Recent Labs    12/05/23 1301 12/06/23 0418  WBC 17.1* 15.4*  HGB 9.4* 8.9*  HCT 28.2* 26.9*  MCV 87.0 87.1  PLT 104* 124*   Basic Metabolic Panel Recent Labs    98/95/74 0436  12/04/23 1656 12/05/23 0349 12/05/23 1624 12/06/23 0418  NA 134* 135   < > 135 135  K 3.8 3.6   < > 3.8 3.8  CL 102 101   < > 102 99  CO2 22 21*   < > 22 22  GLUCOSE 172* 168*   < > 143* 172*  BUN 31* 35*   < > 42* 46*  CREATININE 1.86* 1.76*   < > 1.68* 1.66*  CALCIUM  8.1* 8.9   < > 8.7* 8.6*  MG 2.8* 2.7*  --   --   --   PHOS 3.3 2.6   < > 1.8* 3.2   < > = values in this interval not displayed.   Liver Function Tests Recent Labs    12/05/23 1217 12/05/23 1624 12/06/23 0418  AST 149*  --   --   ALT 31  --   --   ALKPHOS 78  --   --   BILITOT 13.4*  --   --   PROT 6.7  --   --   ALBUMIN  3.3* 3.3* 3.1*   No results for input(s): LIPASE, AMYLASE in the last 72 hours. Cardiac Enzymes No results for input(s): CKTOTAL, CKMB, CKMBINDEX, TROPONINI in the last 72 hours.  BNP: BNP (last 3 results) Recent Labs    05/26/23 1610 08/26/23 1103 09/22/23 0905  BNP 778.0* 781.0* 926.0*     Other  results:  Imaging   DG CHEST PORT 1 VIEW Result Date: 12/06/2023 CLINICAL DATA:  Follow-up intubated patient. Status post cardiac surgery. EXAM: PORTABLE CHEST 1 VIEW COMPARISON:  12/04/2023 and 12/03/2023. FINDINGS: Cardiopericardial silhouette remains enlarged, but unchanged. No mediastinal widening. Vascular congestion and bilateral interstitial thickening. Opacities noted in the left lower lung obscuring the left hemidiaphragm. Possible small left pleural effusion. No pneumothorax. Endotracheal tube has been removed. Right internal jugular Swan-Ganz catheter tip projects in the left main pulmonary artery. Dual lumen left subclavian central venous catheter tip projects in the lower superior vena cava. Enteric tube passes well below the diaphragm and below the included field of view. IMPRESSION: 1. Vascular congestion and interstitial thickening appears increased from the most recent prior study consistent with mild pulmonary edema. 2. Status post extubation. 3. No other change.  Stable enlargement of the cardiopericardial silhouette, but no mediastinal widening. 4. Remaining support apparatus is stable and well positioned. Electronically Signed   By: Alm Parkins M.D.   On: 12/06/2023 08:29     Medications:     Scheduled Medications:  acetaminophen  (TYLENOL ) oral liquid 160 mg/5 mL  650 mg Per Tube Once   arformoterol   15 mcg Nebulization BID   bisacodyl   10 mg Oral Daily   Or   bisacodyl   10 mg Rectal Daily   Chlorhexidine  Gluconate Cloth  6 each Topical Daily   docusate  100 mg Per Tube BID   feeding supplement (PROSource TF20)  60 mL Per Tube QID   Gerhardt's butt cream   Topical BID   influenza vaccine adjuvanted  0.5 mL Intramuscular Tomorrow-1000   insulin  aspart  0-24 Units Subcutaneous Q4H   multivitamin  1 tablet Per Tube QHS   mouth rinse  15 mL Mouth Rinse 4 times per day   pantoprazole  (PROTONIX ) IV  40 mg Intravenous Q24H   polyethylene glycol  17 g Per Tube BID   revefenacin   175 mcg Nebulization Daily   sodium chloride  flush  3 mL Intravenous Q12H    Infusions:   prismasol  BGK 4/2.5 500 mL/hr at 12/06/23 0047    prismasol  BGK 4/2.5 300 mL/hr at 12/06/23 0508   amiodarone  30 mg/hr (12/06/23 1107)   epinephrine  6 mcg/min (12/06/23 1128)   feeding supplement (VITAL AF 1.2 CAL) 20 mL/hr at 12/06/23 1100   heparin  1,100 Units/hr (12/06/23 1100)   meropenem  (MERREM ) IV Stopped (12/06/23 9394)   milrinone  0.125 mcg/kg/min (12/06/23 1100)   norepinephrine  10 mcg/min (12/06/23 1100)   prismasol  BGK 4/2.5 1,000 mL/hr at 12/06/23 0630   vancomycin  Stopped (12/06/23 0531)   vasopressin  0.04 Units/min (12/06/23 1110)    PRN Medications: artificial tears, heparin , HYDROmorphone  (DILAUDID ) injection, levalbuterol , ondansetron  (ZOFRAN ) IV, mouth rinse, oxyCODONE , pneumococcal 20-valent conjugate vaccine, sodium chloride , sodium chloride  flush   Patient Profile   Vanessa [name] is a 73 y.o. with history of nonischemic cardiomyopathy and  severe MR s/p Mitral Clip x2 (2020), pHTN, and CKD IIIb.   Assessment/Plan   1. Post Cardiotomy Cardiogenic/vasoplegic Shock: -Bioprosthetic MVR and tricuspid ring 11/29/23 with Dr. Maryjane. Pre-op  cath with no significant CAD.  Pre-op  echo with EF 40-45%, severe MR. Went back to the OR with post-op tamponade for clean-out.  Echo 12/31 showed EF 25-30%, moderate RV dysfunction, s/p bioprosthetic mitral valve replacement with trivial MR and tricuspid valve repair with trivial TR - Worsened on 1/2 and re-intubated in setting of volume overload, oliguric renal failure &  pulmonary hypertension  - Extubated on 12/04/22 -  Levophed  weaned from 30 to less than 10 today; on low dose epinephrine  and milrinone  0.125mcg/kg/min. CI 2.7 L/min/m2. - Will continue low dose milrinone  until CVP is closer to 7-8 and epi/levo are at lower doses.  - PA 60s/20s; PCWP 20.  - Will continue to remove 50cc/hr. Discussed with nephrology this AM; appreciate their assistance.   2. Mitral regurgitation:   - TEE in 11/19 with severe MR, restricted posterior leaflet.  She had Mitraclip x 2 in 1/20. Post-op echo showed mild MR and mild mitral stenosis.   - Echo in 9/24 showed MV s/p Mitraclip x 2 with mean gradient 6 mmHg and severe mitral regurgitation. TEE was then done to evaluate, showing 2 Mitraclips in place with mean gradient 4 mmHg and severe eccentric MR from between the 2 clips with ERO 0.54 cm^2.  She was not thought to be a candidate for an additional Mitraclip due to lack of room between the two existing clips. R/LHC (11/24) showed moderate mixed pulmonary arterial and pulmonary venous hypertension likely due to mitral valve regurgitation.  - S/p MV replacement and Tricuspid repair on 11/29/23 with Dr. Maryjane - Stable valves on post-op echo. Will repeat echo later this admission.   3. Ventricular ectopy  - Continues to have PVCs - Continue Amio gtt at 30mg /hr.  - Transition to PO amiodarone  tomorrow as pressor  requirements come down.   4. Pulmonary hypertension - Most recent RHC (11/24) with PVR 4.5, with moderate mixed pulmonary arterial and pulmonary venous hypertension likely due to mitral valve regurgitation. Now s/p MVR, PA systolic pressure lower today in 60s.  Suspect still mixed PAH/PVH with elevated PCWP.  NO was tried at 10 ppm but did not have much effect so now off.   -significant improvement in volume removal; PA now 60s/20s with PCWP of 20 today.  - Will hold off on pulmonary vasodilators; small clinical trials demonstrate no improvement and some markers of harm with PDE5i in Group II PH 2/2 mitral valve disease despite MVR.   5. Toxic multinodular goiter: She is now off methimazole . TSH was normal in 8/24. Follows with Endocrinology  6. AKI on CKD stage 3 - Discussed with nephrology - continue to pull 50cc/hr.  - Remains oliguric.   7. Blood loss anemia - Hgb stable at 8.9  8. Complete heart block: She had post-op CHB.  Now appears in AF.   9. Hypoxic / Hypercapnic respiratory failure - Possible extubation today; will discuss with CCM.   10. Atrial fibrillation: Patient appears to be in AF with controlled rate currently.   11. ID:  - WBC ct down to 15 - significant improvement in pressor requirement.  - continue vanc/merrem  (day 4)  12. Thrombocytopenia - Improving; plt ct 124.   13. Deconditioning - Chronic deconditioning; will need extensive PT.   CRITICAL CARE Performed by: Ria Commander   Total critical care time: 37 minutes  Critical care time was exclusive of separately billable procedures and treating other patients.  Critical care was necessary to treat or prevent imminent or life-threatening deterioration.  Critical care was time spent personally by me on the following activities: development of treatment plan with patient and/or surrogate as well as nursing, discussions with consultants, evaluation of patient's response to treatment, examination of  patient, obtaining history from patient or surrogate, ordering and performing treatments and interventions, ordering and review of laboratory studies, ordering and review of radiographic studies, pulse oximetry and re-evaluation of patient's condition.  Ramiro Pangilinan 12/06/2023 11:29 AM

## 2023-12-06 NOTE — Progress Notes (Signed)
 PHARMACY - ANTICOAGULATION CONSULT NOTE  Pharmacy Consult for heparin  Indication: atrial fibrillation  Labs: Recent Labs    12/05/23 0349 12/05/23 0948 12/05/23 1301 12/05/23 1624 12/05/23 2033 12/06/23 0418  HGB 9.6* 10.5* 9.4*  --   --  8.9*  HCT 29.0* 31.0* 28.2*  --   --  26.9*  PLT 97*  --  104*  --   --  124*  APTT  --   --   --   --  60* 58*  HEPARINUNFRC  --   --   --   --  0.16* 0.22*  CREATININE 1.67*  --   --  1.68*  --  1.66*   Assessment: 72yo female subtherapeutic on heparin  with lower PTT despite increased heparin  rate; no infusion issues or signs of bleeding per RN.  Goal of Therapy:  aPTT 66-85 seconds   Plan:   Increase heparin  infusion by 2 units/kg/hr to 1100 units/hr. Check PTT in 8 hours.   Marvetta Dauphin, PharmD, BCPS 12/06/2023 5:02 AM

## 2023-12-06 NOTE — Evaluation (Signed)
 Physical Therapy Evaluation Patient Details Name: Vanessa [name] MRN: [id number] DOB: February 05, 1951 Today's Date: 12/06/2023  History of Present Illness  73 yo F admitted 12/30 with DOE, increasing MR and TR. LCH without CAD. 12/30 MVR and tricuspid valve repair. Intubated 1/2-1/5. 1/2 CRRT started. PMhx: HTN, NICM, HFrEF (EF40-45%), Pulmonary HTN, CKD and  severe MR s/p prior mitral clip x 2 in 2020.  Clinical Impression  Pt pleasant, educated for sternal precautions, transfers and function. Pt normally independent, lives alone and states mother can assist at D/C. Pt currently mod +2 assist for all mobility on CRRT and demonstrates decreased strength, transfers, mobility, balance and function. PT will benefit from acute therapy to maximize mobility, safety and independence adhering to sternal precautions. Encouraged chair position.   HR 75-78 SPO2 97% on 4L         If plan is discharge home, recommend the following: A lot of help with walking and/or transfers;A lot of help with bathing/dressing/bathroom;Assistance with cooking/housework   Can travel by private vehicle        Equipment Recommendations Rolling walker (2 wheels);BSC/3in1  Recommendations for Other Services  OT consult;Rehab consult    Functional Status Assessment Patient has had a recent decline in their functional status and demonstrates the ability to make significant improvements in function in a reasonable and predictable amount of time.     Precautions / Restrictions Precautions Precautions: Sternal;Fall;Other (comment) Precaution Comments: CRRT, swan, mulitple lines, flexiseal, cortrak Restrictions Weight Bearing Restrictions Per Provider Order: Yes RUE Weight Bearing Per Provider Order: Non weight bearing LUE Weight Bearing Per Provider Order: Non weight bearing      Mobility  Bed Mobility Overal bed mobility: Needs Assistance Bed Mobility: Rolling, Sidelying to Sit, Sit to Sidelying Rolling: Mod  assist Sidelying to sit: Mod assist     Sit to sidelying: Mod assist, +2 for physical assistance General bed mobility comments: mod assist to roll bil and then rise from side to sitting with cues for precautions and safety. return to bed with mod+2 to lift legs and control trunk to surface    Transfers Overall transfer level: Needs assistance   Transfers: Sit to/from Stand Sit to Stand: Mod assist, Min assist, +2 physical assistance           General transfer comment: mod+2 initial rise from bed with cues for hand placement and precautions, min +2 second trial with pt able to minimally step toward [facility] with bil UE on therapist arms. fatigued after 2 trials    Ambulation/Gait               General Gait Details: unable  Stairs            Wheelchair Mobility     Tilt Bed    Modified Rankin (Stroke Patients Only)       Balance Overall balance assessment: Needs assistance Sitting-balance support: No upper extremity supported, Feet supported Sitting balance-Leahy Scale: Poor Sitting balance - Comments: posterior left lean in sitting with min-CGA to control and maintain sitting   Standing balance support: Bilateral upper extremity supported Standing balance-Leahy Scale: Poor Standing balance comment: UB support on therapists in standing                             Pertinent Vitals/Pain Pain Assessment Pain Assessment: No/denies pain    Home Living Family/patient expects to be discharged to:: Private residence Living Arrangements: Alone Available Help at Discharge: Family;Available 24  hours/day Type of Home: House Home Access: Level entry       Home Layout: One level Home Equipment: None Additional Comments: mom will be able to assist at D/C    Prior Function Prior Level of Function : Independent/Modified Independent;Driving                     Extremity/Trunk Assessment   Upper Extremity Assessment Upper Extremity  Assessment: Generalized weakness    Lower Extremity Assessment Lower Extremity Assessment: Generalized weakness    Cervical / Trunk Assessment Cervical / Trunk Assessment: Normal  Communication   Communication Communication: No apparent difficulties Cueing Techniques: Verbal cues;Gestural cues;Tactile cues  Cognition Arousal: Alert Behavior During Therapy: Flat affect Overall Cognitive Status: Within Functional Limits for tasks assessed                                 General Comments: pt educated for sternal precautions throughout session and unable to recall end of session        General Comments      Exercises     Assessment/Plan    PT Assessment Patient needs continued PT services  PT Problem List Decreased strength;Decreased activity tolerance;Decreased balance;Decreased mobility;Decreased range of motion;Decreased knowledge of use of DME;Decreased knowledge of precautions       PT Treatment Interventions DME instruction;Gait training;Functional mobility training;Therapeutic activities;Therapeutic exercise;Patient/family education;Neuromuscular re-education;Balance training    PT Goals (Current goals can be found in the Care Plan section)  Acute Rehab PT Goals Patient Stated Goal: return home PT Goal Formulation: With patient Time For Goal Achievement: 12/20/23 Potential to Achieve Goals: Good    Frequency Min 1X/week     Co-evaluation PT/OT/SLP Co-Evaluation/Treatment: Yes Reason for Co-Treatment: Complexity of the patient's impairments (multi-system involvement) PT goals addressed during session: Mobility/safety with mobility         AM-PAC PT 6 Clicks Mobility  Outcome Measure Help needed turning from your back to your side while in a flat bed without using bedrails?: A Lot Help needed moving from lying on your back to sitting on the side of a flat bed without using bedrails?: A Lot Help needed moving to and from a bed to a chair  (including a wheelchair)?: A Lot Help needed standing up from a chair using your arms (e.g., wheelchair or bedside chair)?: Total Help needed to walk in hospital room?: Total Help needed climbing 3-5 steps with a railing? : Total 6 Click Score: 9    End of Session   Activity Tolerance: Patient tolerated treatment well Patient left: in bed;with call bell/phone within reach;with nursing/sitter in room Nurse Communication: Mobility status PT Visit Diagnosis: Other abnormalities of gait and mobility (R26.89);Muscle weakness (generalized) (M62.81);Difficulty in walking, not elsewhere classified (R26.2)    Time: 9081-9055 PT Time Calculation (min) (ACUTE ONLY): 26 min   Charges:   PT Evaluation $PT Eval High Complexity: 1 High   PT General Charges $$ ACUTE PT VISIT: 1 Visit         Lenoard SQUIBB, PT Acute Rehabilitation Services Office: [phone]   Lenoard NOVAK Kerrie Latour 12/06/2023, 9:58 AM

## 2023-12-06 NOTE — Progress Notes (Addendum)
 Inpatient Rehab Admissions Coordinator:   Per therapy recommendations,  patient was screened for CIR candidacy by Leita Kleine, MS, CCC-SLP  At this time, Pt. Is requiring CRRT and is not medically ready for CIR. I will not pursue a rehab consult for this Pt. at this time, but CIR admissions team will follow and monitor for medical readiness and place consult order if Pt. appears to be an appropriate candidate. Please contact me with any questions.   Leita Kleine, MS, CCC-SLP Rehab Admissions Coordinator  [phone] (celll) [phone] (office)

## 2023-12-07 ENCOUNTER — Inpatient Hospital Stay (HOSPITAL_COMMUNITY): Payer: PPO

## 2023-12-07 DIAGNOSIS — J9601 Acute respiratory failure with hypoxia: Secondary | ICD-10-CM | POA: Diagnosis not present

## 2023-12-07 DIAGNOSIS — J810 Acute pulmonary edema: Secondary | ICD-10-CM | POA: Diagnosis not present

## 2023-12-07 DIAGNOSIS — I272 Pulmonary hypertension, unspecified: Secondary | ICD-10-CM | POA: Diagnosis not present

## 2023-12-07 DIAGNOSIS — R570 Cardiogenic shock: Secondary | ICD-10-CM

## 2023-12-07 DIAGNOSIS — Z952 Presence of prosthetic heart valve: Secondary | ICD-10-CM | POA: Diagnosis not present

## 2023-12-07 LAB — APTT
aPTT: 60 s — ABNORMAL HIGH (ref 24–36)
aPTT: 60 s — ABNORMAL HIGH (ref 24–36)

## 2023-12-07 LAB — COOXEMETRY PANEL
Carboxyhemoglobin: 2.3 % — ABNORMAL HIGH (ref 0.5–1.5)
Carboxyhemoglobin: 2.2 % — ABNORMAL HIGH (ref 0.5–1.5)
Methemoglobin: 1.1 % (ref 0.0–1.5)
Methemoglobin: <0.7 % (ref 0.0–1.5)
O2 Saturation: 80.5 %
O2 Saturation: 68.7 %
Total hemoglobin: 8 g/dL — ABNORMAL LOW (ref 12.0–16.0)
Total hemoglobin: 9.5 g/dL — ABNORMAL LOW (ref 12.0–16.0)

## 2023-12-07 LAB — GLUCOSE, CAPILLARY
Glucose-Capillary: 122 mg/dL — ABNORMAL HIGH (ref 70–99)
Glucose-Capillary: 139 mg/dL — ABNORMAL HIGH (ref 70–99)
Glucose-Capillary: 138 mg/dL — ABNORMAL HIGH (ref 70–99)
Glucose-Capillary: 150 mg/dL — ABNORMAL HIGH (ref 70–99)
Glucose-Capillary: 114 mg/dL — ABNORMAL HIGH (ref 70–99)

## 2023-12-07 LAB — RENAL FUNCTION PANEL
Albumin: 3.1 g/dL — ABNORMAL LOW (ref 3.5–5.0)
Albumin: 3 g/dL — ABNORMAL LOW (ref 3.5–5.0)
Anion gap: 12 (ref 5–15)
Anion gap: 14 (ref 5–15)
BUN: 36 mg/dL — ABNORMAL HIGH (ref 8–23)
BUN: 39 mg/dL — ABNORMAL HIGH (ref 8–23)
CO2: 23 mmol/L (ref 22–32)
CO2: 21 mmol/L — ABNORMAL LOW (ref 22–32)
Calcium: 8.6 mg/dL — ABNORMAL LOW (ref 8.9–10.3)
Calcium: 7.9 mg/dL — ABNORMAL LOW (ref 8.9–10.3)
Chloride: 102 mmol/L (ref 98–111)
Chloride: 101 mmol/L (ref 98–111)
Creatinine, Ser: 1.6 mg/dL — ABNORMAL HIGH (ref 0.44–1.00)
Creatinine, Ser: 1.79 mg/dL — ABNORMAL HIGH (ref 0.44–1.00)
GFR, Estimated: 34 mL/min — ABNORMAL LOW (ref >60)
GFR, Estimated: 30 mL/min — ABNORMAL LOW (ref >60)
Glucose, Bld: 131 mg/dL — ABNORMAL HIGH (ref 70–99)
Glucose, Bld: 160 mg/dL — ABNORMAL HIGH (ref 70–99)
Phosphorus: 2.5 mg/dL (ref 2.5–4.6)
Phosphorus: 2.7 mg/dL (ref 2.5–4.6)
Potassium: 3.6 mmol/L (ref 3.5–5.1)
Potassium: 4.1 mmol/L (ref 3.5–5.1)
Sodium: 137 mmol/L (ref 135–145)
Sodium: 136 mmol/L (ref 135–145)

## 2023-12-07 LAB — CBC
HCT: 26.2 % — ABNORMAL LOW (ref 36.0–46.0)
Hemoglobin: 8.9 g/dL — ABNORMAL LOW (ref 12.0–15.0)
MCH: 29.5 pg (ref 26.0–34.0)
MCHC: 34 g/dL (ref 30.0–36.0)
MCV: 86.8 fL (ref 80.0–100.0)
Platelets: 153 10*3/uL (ref 150–400)
RBC: 3.02 MIL/uL — ABNORMAL LOW (ref 3.87–5.11)
RDW: 17.4 % — ABNORMAL HIGH (ref 11.5–15.5)
WBC: 16.6 10*3/uL — ABNORMAL HIGH (ref 4.0–10.5)
nRBC: 8.5 % — ABNORMAL HIGH (ref 0.0–0.2)

## 2023-12-07 LAB — HEPARIN LEVEL (UNFRACTIONATED): Heparin Unfractionated: 0.22 [IU]/mL — ABNORMAL LOW (ref 0.30–0.70)

## 2023-12-07 MED ORDER — SODIUM CHLORIDE 0.9 % IV SOLN: INTRAVENOUS | Status: AC

## 2023-12-07 MED ORDER — DARBEPOETIN ALFA 40 MCG/0.4ML IJ SOSY
40.0000 ug | PREFILLED_SYRINGE | INTRAMUSCULAR | Status: DC
  Administered 2023-12-07 – 2023-12-21 (×3): 40 ug via SUBCUTANEOUS
  Filled 2023-12-07 (×3): qty 0.4

## 2023-12-07 MED ORDER — MIDODRINE HCL 5 MG PO TABS
10.0000 mg | ORAL_TABLET | Freq: Three times a day (TID) | ORAL | Status: DC
  Administered 2023-12-07 – 2023-12-08 (×3): 10 mg
  Filled 2023-12-07 (×3): qty 2

## 2023-12-07 MED ORDER — SODIUM CHLORIDE 0.9 % IV SOLN
INTRAVENOUS | Status: AC
Start: 2023-12-07 — End: 2023-12-08

## 2023-12-07 MED ORDER — SODIUM CHLORIDE 0.9% IV SOLUTION: INTRAVENOUS | Status: DC

## 2023-12-07 MED ORDER — SODIUM CHLORIDE 0.9% IV SOLUTION: INTRAVENOUS | Status: DC | PRN

## 2023-12-07 NOTE — Progress Notes (Signed)
 NAME:  JULIANY DAUGHETY, MRN:  [id number], DOB:  08-Mar-1951, LOS: 8 ADMISSION DATE:  11/29/2023, CONSULTATION DATE:  11/29/23 REFERRING MD:  Dr. Maryjane, CHIEF COMPLAINT:  postop   History of Present Illness:  72yoF with PMH significant for HTN, NICM, HFrEF (EF40-45%), PHTN, CKD and hx of prior severe MR s/p prior mitral clip x 2 in 2020.  Pt with progressive DOE found to have increasing MR  and moderate TR despite adjustments in GDMT.  Not felt to be candidate for additional mitral clip.  Repeat LHC without CAD but showed moderate PHTN.  Underwent mitral valve replacement with tissue valve and tricuspid valve repair by Dr. Maryjane on 12/30.  PCCM consulted to assist with vent and medical management post-operatively in ICU.   Pertinent  Medical History  Never smoker, MR w/ previous Mitral clip x 2 (2020), NICM, HFrEF, PHTN, HTN, CKD3b  Significant Hospital Events: Including procedures, antibiotic start and stop dates in addition to other pertinent events   MVR by Dr. Maryjane; relook for tamponade/washout later in evening without obvious source 1/2 intubated for respiratory decompensation, iNO started. Vanc, meropenem  started. CRRT started.  1/5 extubated  Interim History / Subjective:  She denies SOB and other complaints.   Objective   Blood pressure (!) 111/58, pulse 82, temperature 98.2 F (36.8 C), resp. rate (!) 37, height 5' 3 (1.6 m), weight 81.3 kg, SpO2 95%. PAP: (54-87)/(16-33) 71/31 CVP:  [0 mmHg-22 mmHg] 5 mmHg PCWP:  [26 mmHg] 26 mmHg CO:  [5.4 L/min-5.7 L/min] 5.4 L/min CI:  [2.7 L/min/m2-2.91 L/min/m2] 2.7 L/min/m2      Intake/Output Summary (Last 24 hours) at 12/07/2023 0721 Last data filed at 12/07/2023 0700 Gross per 24 hour  Intake 3162.26 ml  Output 4438 ml  Net -1275.74 ml   Filed Weights   12/05/23 0530 12/06/23 0430 12/07/23 0500  Weight: 84.1 kg 81.9 kg 81.3 kg   Examination: Ill appearing woman sitting up in bed in NAD [place]/AT, eyes mildly  icteric Increased WOB today, no rhales or wheezing.  S1S2, reg rate, irreg rhythm Abd soft, NT No peripheral edema, no cyanosis Awake, answering questions, tracking. Somewhat sleepy, delayed answers to questions.  Skin warm, dry  I/O -1.5 L yesterday, net -7L for the admission  Coox 81% BUN 36 Cr 1.6 on CRRT T bili 13.2, down trending WBC 16.6 H/H 8.9/26.2 Platelets 153  Resolved Hospital Problem list :   Postoperative vent management  Assessment & Plan:  Severe MR (s/p prior mitral clip x 2 in 2020) now s/p tissue MVR Moderate TR s/p TV repair  Moderate PHTN> progressed to severe postop; PA pressures significantly proved with volume removal today NICM, HFrEF, EF 40-45% Post bypass vasoplegia and cardioplegia  Postop tamponade resolved after relook and washout POD #0 Paroxysmal A-flutter, Afib. -post-op care per TCTS -run even on CRRT -swan per AHF -not much room to titrate down on epi, NE today -d/c milrinone  -start midodrine  -eventually cardioversion planned, but respiratory status currently appears too tenuous for this and she still has high inotrope requirements that she would be less likely to successfully cardiovert -heparin  gtt  Mixed cardiogenic and distributive shock - con't pressors for SBP >100 -complete course of antibiotics on 1/9- mero, vanc  Acute pulmonary edema  Acute respiratory failure with hypoxia requiring mechanical ventilation - Running CRRT net even - Out of bed mobility as tolerated, pulmonary hygiene - Wean supplemental oxygen as able to maintain SpO2 greater than 90%  Expected post-operative ABLA Post-op thrombocytopenia-improving -  Transfuse for hemoglobin less than 7 or hemodynamically significant bleeding  AKI on CKD3b now query cardiorenal vs. ATN from initial hemodynamic insults. - Continue CRRT - Bladder scanning protocol with Foley discontinued - Renally dose meds and avoid nephrotoxic medications  Possible infection; no  identified source - Complete 7-day course of vancomycin  and meropenem  on 1/9  Hyperbilirubinemia- downtrending today, likely from right heart failure decompensation -Monitor periodically  At risk for malnutrition - Continue tube feeding at goal  Constipation; resolved> now diarrhea -Banatrol for diarrhea, monitor potassium closely  Deconditioning -Needs PT, OT.  Anticipate discharge will need CIR - CRRT unfortunately limits mobility.   Best Practice (right click and Reselect all SmartList Selections daily)   Diet/type: TF DVT prophylaxis systemic heparin  Pressure ulcer(s): None GI prophylaxis: PPI Lines: Central line, Dialysis Catheter, and Arterial Line Foley:  Yes, and it is still needed Code Status:  full code Last date of multidisciplinary goals of care discussion [per primary]  This patient is critically ill with multiple organ system failure which requires frequent high complexity decision making, assessment, support, evaluation, and titration of therapies. This was completed through the application of advanced monitoring technologies and extensive interpretation of multiple databases. During this encounter critical care time was devoted to patient care services described in this note for 37 minutes.  [name], DO 12/07/23 5:43 PM [facility]  For contact information, see Amion. If no response to pager, please call PCCM consult pager. After hours, 7PM- 7AM, please call Elink.

## 2023-12-07 NOTE — TOC Initial Note (Addendum)
 Transition of Care [facility]) - Initial/Assessment Note    Patient Details  Name: Vanessa [name] MRN: [id number] Date of Birth: [birth year]  Transition [facility]) CM/SW Contact:    Justina Delcia Czar, RN Phone Number: [phone] 12/07/2023, 2:21 PM  Clinical Narrative:                 HF TOC CM spoke to pt at bedside. Gave permission to speak to nephew, Reggie. Contacted nephew, states pt can come stay with him after dc. He is interested in IP rehab at [facility] or at facility closer to him in [place]. States he works from home. Pt lives alone.  Will continue to follow for dc needs.   Notified by [facility] liaison -following patient with TCTS office protocol referral prearranged for [facility] needs Expected Discharge Plan: Skilled Nursing Facility Barriers to Discharge: Continued Medical Work up   Patient Goals and CMS Choice Patient states their goals for this hospitalization and ongoing recovery are:: wants to recover CMS Medicare.gov Compare Post Acute Care list provided to:: Patient Represenative (must comment) Choice offered to / list presented to : [facility] POA / Guardian      Expected Discharge Plan and Services   Discharge Planning Services: CM Consult Post Acute Care Choice: IP Rehab Living arrangements for the past 2 months: Single Family Home                                      Prior Living Arrangements/Services Living arrangements for the past 2 months: Single Family Home Lives with:: Self Patient language and need for interpreter reviewed:: Yes        Need for Family Participation in Patient Care: Yes (Comment) Care giver support system in place?: Yes (comment)   Criminal Activity/Legal Involvement Pertinent to Current Situation/Hospitalization: No - Comment as needed  Activities of Daily Living   ADL Screening (condition at time of admission) Independently performs ADLs?: Yes (appropriate for developmental age) Is the patient deaf or have difficulty hearing?:  No Does the patient have difficulty seeing, even when wearing glasses/contacts?: No Does the patient have difficulty concentrating, remembering, or making decisions?: No  Permission Sought/Granted Permission sought to share information with : Case Manager, Family Supports, PCP Permission granted to share information with : Yes, Verbal Permission Granted  Share Information with NAME: Reggie Brien FISHER  Permission granted to share info w AGENCY: IP rehab  Permission granted to share info w Relationship: nephew  Permission granted to share info w Contact Information: [phone]  Emotional Assessment Appearance:: Appears stated age Attitude/Demeanor/Rapport: Engaged Affect (typically observed): Accepting Orientation: : Oriented to Self, Oriented to Place, Oriented to  Time, Oriented to Situation   Psych Involvement: No (comment)  Admission diagnosis:  S/P mitral valve replacement [Z95.2] Patient Active Problem List   Diagnosis Date Noted   S/P mitral valve replacement 11/29/2023   AKI (acute kidney injury) (HCC) 07/24/2022   Hyperkalemia 07/24/2022   Hyperthyroidism 01/10/2022   Pulmonary HTN (HCC)    PVC (premature ventricular contraction) 02/20/2014   Bruit 02/20/2014   Severe mitral insufficiency 02/20/2014   Postmenopausal bleeding 12/02/2012   Endometrial polyp 12/02/2012   Nonischemic dilated cardiomyopathy (HCC) 10/21/2012   OBESITY 01/20/2008   Essential hypertension 01/20/2008   Chronic systolic CHF (congestive heart failure) (HCC) 01/20/2008   PCP:  Redmon, Noelle, PA Pharmacy:   [facility] - [place]), [place] - [address] [place] ([place]) [place] [address] Phone: [phone] Fax: [phone]     Social Drivers of Health (SDOH) Social History: SDOH Screenings   Food Insecurity: No Food Insecurity (11/30/2023)  Housing: Low Risk  (11/30/2023)  Transportation Needs: No Transportation Needs (11/30/2023)   Utilities: Not At Risk (11/30/2023)  Social Connections: Moderately Integrated (11/30/2023)  Tobacco Use: Low Risk  (11/29/2023)   SDOH Interventions:     Readmission Risk Interventions     No data to display

## 2023-12-07 NOTE — Plan of Care (Signed)
°  Problem: Cardiac: °Goal: Will achieve and/or maintain hemodynamic stability °Outcome: Progressing °  °

## 2023-12-07 NOTE — Progress Notes (Signed)
 [facility] Progress Note   Assessment/ Plan:   Vanessa [name] is a/an 73 y.o. female with a past medical history HTN, NICM, CHF, pulmonary hypertension, mitral and tricuspid valve disease  who present w/ surgery for valvular disease c/b shock, AHRF, and AKI    Oliguric AKI on CKD 3A: Mild baseline CKD likely multifactorial.  Now with AKI secondary to ATN and cardiorenal syndrome in the setting of shock. Started on CRRT on 1/2 for worsening pulmonary edema and volume overload. Dialysis catheter placed by CCM, appreciate help - continue CRRT - all 4k fluids  - UF goal 50 ml/hr as tolerated.  Please page nephrology for UF goal changes -Monitor Daily I/Os, Daily weight  -Maintain MAP>65 for optimal renal perfusion.    Multifactorial shock: Vasoplegic and cardioplegia catheter surgery.   - Has been on pressors and inotropes.  Continue management per primary team - additionally on antibiotics (meropenem  and vanc) and is s/p stress dose steroids    Acute exacerbation of heart failure with reduced ejection fraction: Volume overload.  - Optimize volume with CRRT as above    Severe MR and tricuspid valvular disease:  - Status post repair.  CT surgery managing   Postoperative cardiac tamponade:  - Continue monitoring per cardiology   Hyponatremia: In the setting of AKI, improved on CRRT.     Anemia: Related to blood loss and compounded by AKI and critical illness.   Transfusions per primary team.  Start aranesp  40 mcg weekly    Hyperbilirubinemia - per primary team.  Increasing.   Disposition - in ICU on CRRT   Subjective:    Seen and examined on CRRT.  Procedure supervised.  She had 4.0 liters UF over 1/6 with CRRT as of this note.  UF goal has been 50 ml/hr.  She was weaned to 2 liters nasal cannula.  She has been on levophed  at 11 mcg/min, epi at 6 mcg/min, vasopressin  at 0.04 units/min, and milrinone  and amio.  CVP of 8 which is stable overnight.  Per her RN her levophed   requirement decreased before she went to sleep.    Review of systems:   She mostly shakes her head to answer does speak a bit more today Denies shortness of breath or air hunger Denies chest pain  Denies n/v    Objective:   BP (!) 94/58   Pulse 89   Temp 98.4 F (36.9 C)   Resp 20   Ht 5' 3 (1.6 m)   Wt 81.9 kg   SpO2 95%   BMI 31.98 kg/m   Intake/Output Summary (Last 24 hours) at 12/07/2023 0537 Last data filed at 12/07/2023 0500 Gross per 24 hour  Intake 3099.93 ml  Output 4730 ml  Net -1630.07 ml   Weight change:   Physical Exam:   General elderly female in no acute distress HEENT normocephalic atraumatic extraocular movements intact sclera anicteric Neck supple trachea midline Lungs clear but reduced on auscultation bilaterally normal work of breathing at rest on 2 liters oxygen Heart S1S2 Abdomen soft nontender nondistended Extremities no lower extremity edema appreciated Psych no anxiety or agitation Neuro - alert and interactive; mostly shakes or nods head to answer questions but does provide her name today Access left internal jugular non-tunneled dialysis catheter   Imaging: DG CHEST PORT 1 VIEW Result Date: 12/06/2023 CLINICAL DATA:  Follow-up intubated patient. Status post cardiac surgery. EXAM: PORTABLE CHEST 1 VIEW COMPARISON:  12/04/2023 and 12/03/2023. FINDINGS: Cardiopericardial silhouette remains enlarged, but unchanged.  No mediastinal widening. Vascular congestion and bilateral interstitial thickening. Opacities noted in the left lower lung obscuring the left hemidiaphragm. Possible small left pleural effusion. No pneumothorax. Endotracheal tube has been removed. Right internal jugular Swan-Ganz catheter tip projects in the left main pulmonary artery. Dual lumen left subclavian central venous catheter tip projects in the lower superior vena cava. Enteric tube passes well below the diaphragm and below the included field of view. IMPRESSION: 1. Vascular  congestion and interstitial thickening appears increased from the most recent prior study consistent with mild pulmonary edema. 2. Status post extubation. 3. No other change. Stable enlargement of the cardiopericardial silhouette, but no mediastinal widening. 4. Remaining support apparatus is stable and well positioned. Electronically Signed   By: Alm Parkins M.D.   On: 12/06/2023 08:29    Labs: BMET Recent Labs  Lab 12/04/23 0436 12/04/23 1656 12/05/23 0349 12/05/23 0948 12/05/23 1624 12/06/23 0418 12/06/23 1544 12/07/23 0354  NA 134* 135 135 136 135 135 136 137  K 3.8 3.6 3.8 3.8 3.8 3.8 3.8 3.6  CL 102 101 101  --  102 99 101 102  CO2 22 21* 20*  --  22 22 22 23   GLUCOSE 172* 168* 154*  --  143* 172* 129* 131*  BUN 31* 35* 39*  --  42* 46* 46* 36*  CREATININE 1.86* 1.76* 1.67*  --  1.68* 1.66* 1.64* 1.60*  CALCIUM  8.1* 8.9 8.9  --  8.7* 8.6* 8.5* 8.6*  PHOS 3.3 2.6 2.0*  --  1.8* 3.2 2.3* 2.5   CBC Recent Labs  Lab 12/05/23 0349 12/05/23 0948 12/05/23 1301 12/06/23 0418 12/07/23 0354  WBC 16.3*  --  17.1* 15.4* 16.6*  HGB 9.6* 10.5* 9.4* 8.9* 8.9*  HCT 29.0* 31.0* 28.2* 26.9* 26.2*  MCV 87.6  --  87.0 87.1 86.8  PLT 97*  --  104* 124* 153    Medications:     acetaminophen  (TYLENOL ) oral liquid 160 mg/5 mL  650 mg Per Tube Once   arformoterol   15 mcg Nebulization BID   Chlorhexidine  Gluconate Cloth  6 each Topical Daily   fiber supplement (BANATROL TF)  60 mL Per Tube BID   Gerhardt's butt cream   Topical BID   influenza vaccine adjuvanted  0.5 mL Intramuscular Tomorrow-1000   insulin  aspart  0-24 Units Subcutaneous Q4H   multivitamin  1 tablet Per Tube QHS   mouth rinse  15 mL Mouth Rinse 4 times per day   pantoprazole  (PROTONIX ) IV  40 mg Intravenous Q24H   revefenacin   175 mcg Nebulization Daily   sodium chloride  flush  3 mL Intravenous Q12H   Katheryn JAYSON Saba, MD 12/07/2023, 5:52 AM

## 2023-12-07 NOTE — Progress Notes (Signed)
      [address]       [place] [address]             [phone]    POD # 8  BP 119/63 (BP Location: Left Arm)   Pulse 81   Temp 99.1 F (37.3 C)   Resp (!) 28   Ht 5' 3 (1.6 m)   Wt 81.3 kg   SpO2 96%   BMI 31.75 kg/m   55/24 CI 3.02 Epi @ 6, norepi down to 7 from 12 this AM Co-ox 69 K 4.1  Remains on CRRT  Deshan Hemmelgarn C. Kerrin, MD [facility] [phone]

## 2023-12-07 NOTE — Progress Notes (Signed)
 Patient ID: Vanessa [name], female   DOB: Jun 18, 1951, 73 y.o.   MRN: [id number]      Advanced Heart Failure Rounding Note  Cardiologist: Dr. Rolan Chief Complaint: Cardiogenic shock  Subjective:     S/P MVR (tissue) and TV ring and same day return to OR for tamponade/bleeding. 1/2: Progressive volume overload with poor UOP, CVVH started but ended up re-intubated.    - Telemetry with atrial fibrillation; rate controlled.  - Levophed  at 11mcg; epinephrine  low dose, vasopressin  - No acute events overnight - CVP 6; PA 60s/20s with CI 3.1, PCWP 15-17  Objective:   Weight Range: 81.3 kg Body mass index is 31.75 kg/m.   Vital Signs:   Temp:  [98.2 F (36.8 C)-99.1 F (37.3 C)] 98.6 F (37 C) (01/07 0900) Pulse Rate:  [70-95] 88 (01/07 0900) Resp:  [12-40] 23 (01/07 0900) BP: (90-123)/(43-65) 110/65 (01/07 0900) SpO2:  [92 %-100 %] 94 % (01/07 0900) Arterial Line BP: (89-126)/(36-50) 100/41 (01/07 0900) Weight:  [81.3 kg] 81.3 kg (01/07 0500) Last BM Date : 12/07/23  Weight change: Filed Weights   12/05/23 0530 12/06/23 0430 12/07/23 0500  Weight: 84.1 kg 81.9 kg 81.3 kg   Intake/Output:   Intake/Output Summary (Last 24 hours) at 12/07/2023 0947 Last data filed at 12/07/2023 0910 Gross per 24 hour  Intake 3352.8 ml  Output 4720 ml  Net -1367.2 ml    Physical Exam   General: sleepy Neck: JVP 8 Lungs: decreased at bases; normal work of breathing.  CV: Nondisplaced PMI.  Heart rate irregular; S1/S2, no S3/S4, no murmur.  Trace peripheral edema Abdomen: Soft, nontender, no hepatosplenomegaly, no distention.  Skin: Intact without lesions or rashes.  Neurologic: following commands.  Extremities: No clubbing or cyanosis.  HEENT: Normal.   Telemetry   Rate controlled atrial fibrillation  Labs    CBC Recent Labs    12/06/23 0418 12/07/23 0354  WBC 15.4* 16.6*  HGB 8.9* 8.9*  HCT 26.9* 26.2*  MCV 87.1 86.8  PLT 124* 153   Basic Metabolic Panel Recent Labs     12/04/23 1656 12/05/23 0349 12/06/23 1544 12/07/23 0354  NA 135   < > 136 137  K 3.6   < > 3.8 3.6  CL 101   < > 101 102  CO2 21*   < > 22 23  GLUCOSE 168*   < > 129* 131*  BUN 35*   < > 46* 36*  CREATININE 1.76*   < > 1.64* 1.60*  CALCIUM  8.9   < > 8.5* 8.6*  MG 2.7*  --   --   --   PHOS 2.6   < > 2.3* 2.5   < > = values in this interval not displayed.   Liver Function Tests Recent Labs    12/05/23 1217 12/05/23 1624 12/06/23 1103 12/06/23 1544 12/07/23 0354  AST 149*  --  108*  --   --   ALT 31  --  39  --   --   ALKPHOS 78  --  74  --   --   BILITOT 13.4*  --  13.2*  --   --   PROT 6.7  --  6.4*  --   --   ALBUMIN  3.3*   < > 3.1* 3.0* 3.1*   < > = values in this interval not displayed.   No results for input(s): LIPASE, AMYLASE in the last 72 hours. Cardiac Enzymes No results for input(s): CKTOTAL, CKMB, CKMBINDEX, TROPONINI in  the last 72 hours.  BNP: BNP (last 3 results) Recent Labs    05/26/23 1610 08/26/23 1103 09/22/23 0905  BNP 778.0* 781.0* 926.0*     Other results:  Imaging   No results found.    Medications:     Scheduled Medications:  acetaminophen  (TYLENOL ) oral liquid 160 mg/5 mL  650 mg Per Tube Once   arformoterol   15 mcg Nebulization BID   Chlorhexidine  Gluconate Cloth  6 each Topical Daily   darbepoetin (ARANESP ) injection - NON-DIALYSIS  40 mcg Subcutaneous Q Tue-1800   fiber supplement (BANATROL TF)  60 mL Per Tube BID   Gerhardt's butt cream   Topical BID   influenza vaccine adjuvanted  0.5 mL Intramuscular Tomorrow-1000   insulin  aspart  0-24 Units Subcutaneous Q4H   multivitamin  1 tablet Per Tube QHS   mouth rinse  15 mL Mouth Rinse 4 times per day   pantoprazole  (PROTONIX ) IV  40 mg Intravenous Q24H   revefenacin   175 mcg Nebulization Daily   sodium chloride  flush  3 mL Intravenous Q12H    Infusions:   prismasol  BGK 4/2.5 500 mL/hr at 12/07/23 0759    prismasol  BGK 4/2.5 300 mL/hr at 12/06/23 2143    amiodarone  30 mg/hr (12/07/23 0900)   epinephrine  6 mcg/min (12/07/23 0900)   feeding supplement (VITAL AF 1.2 CAL) 55 mL/hr at 12/07/23 0900   heparin  1,100 Units/hr (12/07/23 0900)   meropenem  (MERREM ) IV Stopped (12/07/23 0718)   milrinone  Stopped (12/07/23 9167)   norepinephrine  12 mcg/min (12/07/23 0900)   prismasol  BGK 4/2.5 1,000 mL/hr at 12/07/23 0800   vancomycin  Stopped (12/07/23 9361)   vasopressin  0.04 Units/min (12/07/23 0900)    PRN Medications: artificial tears, bisacodyl  **OR** bisacodyl , docusate, heparin , HYDROmorphone  (DILAUDID ) injection, levalbuterol , ondansetron  (ZOFRAN ) IV, mouth rinse, oxyCODONE , pneumococcal 20-valent conjugate vaccine, sodium chloride , sodium chloride  flush   Patient Profile   Vanessa [name] is a 73 y.o. with history of nonischemic cardiomyopathy and severe MR s/p Mitral Clip x2 (2020), pHTN, and CKD IIIb.   Assessment/Plan   1. Post Cardiotomy Cardiogenic/vasoplegic Shock: -Bioprosthetic MVR and tricuspid ring 11/29/23 with Dr. Maryjane. Pre-op  cath with no significant CAD.  Pre-op  echo with EF 40-45%, severe MR. Went back to the OR with post-op tamponade for clean-out.  Echo 12/31 showed EF 25-30%, moderate RV dysfunction, s/p bioprosthetic mitral valve replacement with trivial MR and tricuspid valve repair with trivial TR - Worsened on 1/2 and re-intubated in setting of volume overload, oliguric renal failure &  pulmonary hypertension  - Extubated on 12/04/22 - levophed  at this AM; CI >3 L/min/m2. Although, PA pressures remain elevated at 60s/20s will wean off milrinone  with hope that vasoplegia improves allowing pressor wean.  - Plan for DCCV once levophed  is weaned down slightly; I suspect she will have high probability of recurrent afib while on pressors.   2. Mitral regurgitation:   - TEE in 11/19 with severe MR, restricted posterior leaflet.  She had Mitraclip x 2 in 1/20. Post-op echo showed mild MR and mild mitral stenosis.    - Echo in 9/24 showed MV s/p Mitraclip x 2 with mean gradient 6 mmHg and severe mitral regurgitation. TEE was then done to evaluate, showing 2 Mitraclips in place with mean gradient 4 mmHg and severe eccentric MR from between the 2 clips with ERO 0.54 cm^2.  She was not thought to be a candidate for an additional Mitraclip due to lack of room between the two existing clips. R/LHC (  11/24) showed moderate mixed pulmonary arterial and pulmonary venous hypertension likely due to mitral valve regurgitation.  - S/p MV replacement and Tricuspid repair on 11/29/23 with Dr. Maryjane - Repeat limited TTE tomorrow; stable.   3. Ventricular ectopy  - Continues to have PVCs - Continue amiodarone  gtt at 30mg /hr; no longer having ectopy.   4. Pulmonary hypertension - Most recent RHC (11/24) with PVR 4.5, with moderate mixed pulmonary arterial and pulmonary venous hypertension likely due to mitral valve regurgitation. Now s/p MVR, PA systolic pressure lower today in 60s.  Suspect still mixed PAH/PVH with elevated PCWP.  NO was tried at 10 ppm but did not have much effect so now off.   - Will hold off on pulmonary vasodilators; small clinical trials demonstrate no improvement and some markers of harm with PDE5i in Group II PH 2/2 mitral valve disease despite MVR.  - PA pressures 60s/20s, PCWP of 15; significant TPG consistent with some degree of fixed PH. Wean off milrinone  today. Will watch hemodynamics closely.   5. Toxic multinodular goiter: She is now off methimazole . TSH was normal in 8/24. Follows with Endocrinology  6. AKI on CKD stage 3 - Discussed with nephrology - maintain even I/Os; filling pressures by PA catheter at goal.   7. Blood loss anemia - Hgb stable.   8. Complete heart block: She had post-op CHB.  Now appears in AF.   9. Hypoxic / Hypercapnic respiratory failure - extubated; awake.   10. Atrial fibrillation: Patient  in AF with controlled rate currently. Continue amio gtt. DCCV later  today or tomorrow.   11. ID:  - WBC ct down up to 16 from 15 - continue vanc/merrem  (day 5)  12. Thrombocytopenia - Improving; plt ct 153  13. Deconditioning - Chronic deconditioning; will need extensive PT.   CRITICAL CARE Performed by: Ria Commander   Total critical care time: 35 minutes  Critical care time was exclusive of separately billable procedures and treating other patients.  Critical care was necessary to treat or prevent imminent or life-threatening deterioration.  Critical care was time spent personally by me on the following activities: development of treatment plan with patient and/or surrogate as well as nursing, discussions with consultants, evaluation of patient's response to treatment, examination of patient, obtaining history from patient or surrogate, ordering and performing treatments and interventions, ordering and review of laboratory studies, ordering and review of radiographic studies, pulse oximetry and re-evaluation of patient's condition.   Niamya Vittitow 12/07/2023 9:47 AM

## 2023-12-07 NOTE — Progress Notes (Signed)
 [address]       [place] [address]             [phone]      8 Days Post-Op  Procedure(s) (LRB): EXPLORATION POST OPERATIVE OPEN HEART (N/A)   Total Length of Stay:  LOS: 8 days    SUBJECTIVE: She is frustrated on how long things are taking to get better  Vitals:   12/07/23 0630 12/07/23 0645  BP:    Pulse: 92 89  Resp: (!) 23 (!) 26  Temp: 98.2 F (36.8 C) 98.4 F (36.9 C)  SpO2: 96% 96%    Intake/Output      01/06 0701 01/07 0700 01/07 0701 01/08 0700   I.V. (mL/kg) 1571.9 (19.3)    Other     NG/GT 777.8    IV Piggyback 531.7    Total Intake(mL/kg) 2881.3 (35.4)    Urine (mL/kg/hr)     Stool 270    CRRT 4168    Total Output 4438    Net -1556.7              prismasol  BGK 4/2.5 500 mL/hr at 12/06/23 1136    prismasol  BGK 4/2.5 300 mL/hr at 12/06/23 2143   amiodarone  30 mg/hr (12/07/23 0600)   epinephrine  6 mcg/min (12/07/23 0600)   feeding supplement (VITAL AF 1.2 CAL) 45 mL/hr at 12/07/23 0600   heparin  1,100 Units/hr (12/07/23 0600)   meropenem  (MERREM ) IV 1 g (12/07/23 0648)   milrinone  0.125 mcg/kg/min (12/07/23 0600)   norepinephrine  11 mcg/min (12/07/23 0600)   prismasol  BGK 4/2.5 1,000 mL/hr at 12/07/23 0253   vancomycin  200 mL/hr at 12/07/23 0600   vasopressin  0.04 Units/min (12/07/23 0600)    CBC    Component Value Date/Time   WBC 16.6 (H) 12/07/2023 0354   RBC 3.02 (L) 12/07/2023 0354   HGB 8.9 (L) 12/07/2023 0354   HCT 26.2 (L) 12/07/2023 0354   PLT 153 12/07/2023 0354   MCV 86.8 12/07/2023 0354   MCH 29.5 12/07/2023 0354   MCHC 34.0 12/07/2023 0354   RDW 17.4 (H) 12/07/2023 0354   LYMPHSABS 1.1 07/24/2022 1528   MONOABS 0.6 07/24/2022 1528   EOSABS 0.0 07/24/2022 1528   BASOSABS 0.0 07/24/2022 1528   CMP     Component Value Date/Time   NA 137 12/07/2023 0354   K 3.6 12/07/2023 0354   CL 102 12/07/2023 0354   CO2 23 12/07/2023 0354   GLUCOSE 131 (H) 12/07/2023 0354   BUN 36 (H) 12/07/2023  0354   CREATININE 1.60 (H) 12/07/2023 0354   CREATININE 1.30 (H) 04/29/2016 1144   CALCIUM  8.6 (L) 12/07/2023 0354   PROT 6.4 (L) 12/06/2023 1103   ALBUMIN  3.1 (L) 12/07/2023 0354   AST 108 (H) 12/06/2023 1103   ALT 39 12/06/2023 1103   ALKPHOS 74 12/06/2023 1103   BILITOT 13.2 (H) 12/06/2023 1103   GFRNONAA 34 (L) 12/07/2023 0354   GFRAA 42 (L) 08/19/2020 1304   ABG    Component Value Date/Time   PHART 7.414 12/05/2023 0948   PCO2ART 37.0 12/05/2023 0948   PO2ART 124 (H) 12/05/2023 0948   HCO3 23.7 12/05/2023 0948   TCO2 25 12/05/2023 0948   ACIDBASEDEF 1.0 12/05/2023 0948   O2SAT 80.5 12/07/2023 0354   CBG (last 3)  Recent Labs    12/06/23 1944 12/06/23 2307 12/07/23 0313  GLUCAP 144* 121* 122*  EXAM Lungs: clear Card: IRR  Ext: warm Neuro: intact  ASSESSMENT: POD #8 SP MV replacement and TV repair with reexploration for tamponade Hemodynamics stable on multiple drips. Coox 80. Hopefully able to dc milrinone  and then swan.(11 days old) and continue to wean epi and levo. ? Midodrine  Aflutter: consider cardioversion when down on epi On anticoagulation Cont CRRT until off inotropes Swallow study to start po feeds PT   Deward Kallman, MD 12/07/2023

## 2023-12-07 NOTE — Progress Notes (Signed)
 PHARMACY - ANTICOAGULATION CONSULT NOTE  Pharmacy Consult for IV heparin  Indication: atrial fibrillation  No Known Allergies  Patient Measurements: Height: 5' 3 (160 cm) Weight: 81.3 kg (179 lb 3.7 oz) IBW/kg (Calculated) : 52.4 Heparin  Dosing Weight: ~ 70 kg  Vital Signs: Temp: 98.6 F (37 C) (01/07 0900) Temp Source: Core (01/07 0800) BP: 110/65 (01/07 0900) Pulse Rate: 88 (01/07 0900)  Labs: Recent Labs    12/05/23 1301 12/05/23 1624 12/05/23 2033 12/06/23 0418 12/06/23 1303 12/06/23 1544 12/07/23 0354  HGB 9.4*  --   --  8.9*  --   --  8.9*  HCT 28.2*  --   --  26.9*  --   --  26.2*  PLT 104*  --   --  124*  --   --  153  APTT  --    < > 60* 58* 79*  --  60*  HEPARINUNFRC  --   --  0.16* 0.22*  --   --  0.22*  CREATININE  --    < >  --  1.66*  --  1.64* 1.60*   < > = values in this interval not displayed.    Estimated Creatinine Clearance: 32.1 mL/min (A) (by C-G formula based on SCr of 1.6 mg/dL (H)).   Medical History: Past Medical History:  Diagnosis Date   CHF (congestive heart failure) (HCC)    CKD (chronic kidney disease)    Heart murmur    Hypertension    Moderate mitral regurgitation    moderate to severe MR with moderate pulmonary HTN   Multinodular goiter    Nonischemic cardiomyopathy (HCC)    EF 30-35% by echo 2015   Pulmonary HTN (HCC)    moderate with PASP by echo 2015   PVC's (premature ventricular contractions)     Medications:  Infusions:    prismasol  BGK 4/2.5 500 mL/hr at 12/07/23 0759    prismasol  BGK 4/2.5 300 mL/hr at 12/06/23 2143   amiodarone  30 mg/hr (12/07/23 0900)   epinephrine  6 mcg/min (12/07/23 0900)   feeding supplement (VITAL AF 1.2 CAL) 55 mL/hr at 12/07/23 0900   heparin  1,100 Units/hr (12/07/23 0900)   meropenem  (MERREM ) IV Stopped (12/07/23 0718)   milrinone  Stopped (12/07/23 9167)   norepinephrine  12 mcg/min (12/07/23 0900)   prismasol  BGK 4/2.5 1,000 mL/hr at 12/07/23 0800   vancomycin  Stopped  (12/07/23 9361)   vasopressin  0.04 Units/min (12/07/23 0900)    Assessment: 73 yo female s/p MVR now with afib, pharmacy asked to start anticoagulation with IV heparin .  aPTT slightly subtherapeutic at 60, on 1100 units/hr. No s/sx of bleeding or infusion issues per discussion with RN and CBC findings stable. Total bilirudin still elevated at 13.2 on 1/6.    Goal of Therapy:  Heparin  level 0.3-0.5 aPTT 66-85 Monitor platelets by anticoagulation protocol: Yes   Plan:  Increase heparin  infusion to 1150 units/hr  Minor increase as was previously therapeutic Will target lower end of therapeutic range given recent OHS. Heparin  level in 8 hours Daily heparin  level and CBC  Thank you for allowing pharmacy to participate in this patient's care,  Randall Call, PharmD PGY2 Critical Care Pharmacy Resident 12/07/2023 9:20 AM  Please check AMION for all [facility] Pharmacy phone numbers After 10:00 PM, call Main Pharmacy [phone]

## 2023-12-07 NOTE — Progress Notes (Signed)
 PHARMACY - ANTICOAGULATION CONSULT NOTE  Pharmacy Consult for IV heparin  Indication: atrial fibrillation  No Known Allergies  Patient Measurements: Height: 5' 3 (160 cm) Weight: 81.3 kg (179 lb 3.7 oz) IBW/kg (Calculated) : 52.4 Heparin  Dosing Weight: ~ 70 kg  Vital Signs: Temp: 99.1 F (37.3 C) (01/07 1915) Temp Source: Core (01/07 1900) BP: 115/49 (01/07 1900) Pulse Rate: 80 (01/07 1915)  Labs: Recent Labs    12/05/23 1301 12/05/23 1624 12/05/23 2033 12/06/23 0418 12/06/23 1303 12/06/23 1544 12/07/23 0354 12/07/23 1617 12/07/23 1830  HGB 9.4*  --   --  8.9*  --   --  8.9*  --   --   HCT 28.2*  --   --  26.9*  --   --  26.2*  --   --   PLT 104*  --   --  124*  --   --  153  --   --   APTT  --    < > 60* 58* 79*  --  60*  --  60*  HEPARINUNFRC  --   --  0.16* 0.22*  --   --  0.22*  --   --   CREATININE  --    < >  --  1.66*  --  1.64* 1.60* 1.79*  --    < > = values in this interval not displayed.    Estimated Creatinine Clearance: 28.7 mL/min (A) (by C-G formula based on SCr of 1.79 mg/dL (H)).   Assessment: 73 yo female s/p MVR now with afib, pharmacy asked to start anticoagulation with IV heparin .  aPTT remains slightly subtherapeutic at 60 sec, on 1150 units/hr. No s/sx of bleeding or infusion issues. Total bilirudin still elevated at 13.2 on 1/6 so utilizing aPTT for monitoring as elevated tbili can affect heparin  levels.    Goal of Therapy:  Heparin  level 0.3-0.5 units/ml aPTT 66-85 sec Monitor platelets by anticoagulation protocol: Yes   Plan:  Increase heparin  infusion to 1250 units/hr  aPTT in 8 hours  Thank you for allowing pharmacy to participate in this patient's care,  Vito Ralph, PharmD, BCPS Please see amion for complete clinical pharmacist phone list 12/07/2023 7:19 PM

## 2023-12-07 NOTE — Plan of Care (Signed)
  Problem: Nutrition: Goal: Adequate nutrition will be maintained Outcome: Progressing   Problem: Coping: Goal: Level of anxiety will decrease Outcome: Progressing   Problem: Pain Management: Goal: General experience of comfort will improve Outcome: Progressing   Problem: Safety: Goal: Ability to remain free from injury will improve Outcome: Progressing   Problem: Skin Integrity: Goal: Risk for impaired skin integrity will decrease Outcome: Progressing

## 2023-12-08 DIAGNOSIS — J9601 Acute respiratory failure with hypoxia: Secondary | ICD-10-CM | POA: Diagnosis not present

## 2023-12-08 DIAGNOSIS — N179 Acute kidney failure, unspecified: Secondary | ICD-10-CM | POA: Diagnosis not present

## 2023-12-08 DIAGNOSIS — Z952 Presence of prosthetic heart valve: Secondary | ICD-10-CM | POA: Diagnosis not present

## 2023-12-08 DIAGNOSIS — R570 Cardiogenic shock: Secondary | ICD-10-CM | POA: Diagnosis not present

## 2023-12-08 DIAGNOSIS — I272 Pulmonary hypertension, unspecified: Secondary | ICD-10-CM | POA: Diagnosis not present

## 2023-12-08 LAB — RENAL FUNCTION PANEL
Albumin: 2.9 g/dL — ABNORMAL LOW (ref 3.5–5.0)
Albumin: 3.1 g/dL — ABNORMAL LOW (ref 3.5–5.0)
Anion gap: 10 (ref 5–15)
Anion gap: 15 (ref 5–15)
BUN: 36 mg/dL — ABNORMAL HIGH (ref 8–23)
BUN: 38 mg/dL — ABNORMAL HIGH (ref 8–23)
CO2: 23 mmol/L (ref 22–32)
CO2: 20 mmol/L — ABNORMAL LOW (ref 22–32)
Calcium: 8.1 mg/dL — ABNORMAL LOW (ref 8.9–10.3)
Calcium: 8.4 mg/dL — ABNORMAL LOW (ref 8.9–10.3)
Chloride: 102 mmol/L (ref 98–111)
Chloride: 98 mmol/L (ref 98–111)
Creatinine, Ser: 1.68 mg/dL — ABNORMAL HIGH (ref 0.44–1.00)
Creatinine, Ser: 1.63 mg/dL — ABNORMAL HIGH (ref 0.44–1.00)
GFR, Estimated: 32 mL/min — ABNORMAL LOW (ref >60)
GFR, Estimated: 33 mL/min — ABNORMAL LOW (ref >60)
Glucose, Bld: 150 mg/dL — ABNORMAL HIGH (ref 70–99)
Glucose, Bld: 162 mg/dL — ABNORMAL HIGH (ref 70–99)
Phosphorus: 2.8 mg/dL (ref 2.5–4.6)
Phosphorus: 2.7 mg/dL (ref 2.5–4.6)
Potassium: 4.3 mmol/L (ref 3.5–5.1)
Potassium: 5 mmol/L (ref 3.5–5.1)
Sodium: 135 mmol/L (ref 135–145)
Sodium: 133 mmol/L — ABNORMAL LOW (ref 135–145)

## 2023-12-08 LAB — GLUCOSE, CAPILLARY
Glucose-Capillary: 140 mg/dL — ABNORMAL HIGH (ref 70–99)
Glucose-Capillary: 140 mg/dL — ABNORMAL HIGH (ref 70–99)
Glucose-Capillary: 143 mg/dL — ABNORMAL HIGH (ref 70–99)
Glucose-Capillary: 154 mg/dL — ABNORMAL HIGH (ref 70–99)
Glucose-Capillary: 154 mg/dL — ABNORMAL HIGH (ref 70–99)
Glucose-Capillary: 157 mg/dL — ABNORMAL HIGH (ref 70–99)
Glucose-Capillary: 77 mg/dL (ref 70–99)

## 2023-12-08 LAB — COOXEMETRY PANEL
Carboxyhemoglobin: 2.6 % — ABNORMAL HIGH (ref 0.5–1.5)
Methemoglobin: 0.8 % (ref 0.0–1.5)
O2 Saturation: 65.3 %
Total hemoglobin: 9.3 g/dL — ABNORMAL LOW (ref 12.0–16.0)

## 2023-12-08 LAB — APTT
aPTT: 68 s — ABNORMAL HIGH (ref 24–36)
aPTT: 53 s — ABNORMAL HIGH (ref 24–36)
aPTT: 97 s — ABNORMAL HIGH (ref 24–36)

## 2023-12-08 LAB — POTASSIUM: Potassium: 4.5 mmol/L (ref 3.5–5.1)

## 2023-12-08 LAB — MAGNESIUM: Magnesium: 2.6 mg/dL — ABNORMAL HIGH (ref 1.7–2.4)

## 2023-12-08 LAB — HEPARIN LEVEL (UNFRACTIONATED): Heparin Unfractionated: 0.26 [IU]/mL — ABNORMAL LOW (ref 0.30–0.70)

## 2023-12-08 MED ORDER — BANATROL TF EN LIQD
60.0000 mL | Freq: Three times a day (TID) | ENTERAL | Status: DC
  Administered 2023-12-08 – 2023-12-17 (×26): 60 mL
  Filled 2023-12-08 (×25): qty 60

## 2023-12-08 MED ORDER — MIDODRINE HCL 5 MG PO TABS
15.0000 mg | ORAL_TABLET | Freq: Three times a day (TID) | ORAL | Status: DC
  Administered 2023-12-08 – 2023-12-13 (×15): 15 mg
  Filled 2023-12-08 (×15): qty 3

## 2023-12-08 NOTE — Progress Notes (Signed)
 NAME:  Vanessa [name], MRN:  [id number], DOB:  Jul 20, 1951, LOS: 9 ADMISSION DATE:  11/29/2023, CONSULTATION DATE:  11/29/23 REFERRING MD:  Dr. Maryjane, CHIEF COMPLAINT:  postop   History of Present Illness:  72yoF with PMH significant for HTN, NICM, HFrEF (EF40-45%), PHTN, CKD and hx of prior severe MR s/p prior mitral clip x 2 in 2020.  Pt with progressive DOE found to have increasing MR  and moderate TR despite adjustments in GDMT.  Not felt to be candidate for additional mitral clip.  Repeat LHC without CAD but showed moderate PHTN.  Underwent mitral valve replacement with tissue valve and tricuspid valve repair by Dr. Maryjane on 12/30.  PCCM consulted to assist with vent and medical management post-operatively in ICU.   Pertinent  Medical History  Never smoker, MR w/ previous Mitral clip x 2 (2020), NICM, HFrEF, PHTN, HTN, CKD3b  Significant Hospital Events: Including procedures, antibiotic start and stop dates in addition to other pertinent events   MVR by Dr. Maryjane; relook for tamponade/washout later in evening without obvious source 1/2 intubated for respiratory decompensation, iNO started. Vanc, meropenem  started. CRRT started.  1/5 extubated 1/8 converted to NSR overnight  Interim History / Subjective:  Converted to NSR overnight.  Off milrinone . Currently on 3 NE, 5 epi, 0.04 Vaso.  Objective   Blood pressure 110/64, pulse 84, temperature 98.8 F (37.1 C), resp. rate 19, height 5' 3 (1.6 m), weight 79.1 kg, SpO2 95%. PAP: (39-74)/(16-42) 59/34 CVP:  [2 mmHg-63 mmHg] 10 mmHg PCWP:  [16 mmHg] 16 mmHg CO:  [4.7 L/min-5.9 L/min] 4.7 L/min CI:  [2.39 L/min/m2-3.02 L/min/m2] 2.39 L/min/m2  FiO2 (%):  [24 %] 24 %   Intake/Output Summary (Last 24 hours) at 12/08/2023 0813 Last data filed at 12/08/2023 0800 Gross per 24 hour  Intake 3968.66 ml  Output 3705 ml  Net 263.66 ml   Filed Weights   12/06/23 0430 12/07/23 0500 12/08/23 0500  Weight: 81.9 kg 81.3 kg 79.1 kg    Examination: Ill appearing woman sitting up in bed in NAD [place]/AT, eyes mildly icteric, EOMI Slightly labored but able to converse. CTAB RRR, no M/R/G Abd soft, NT No peripheral edema, no cyanosis Awake, answering questions, tracking. Follows basic commands. Skin warm, dry   Resolved Hospital Problem list :   Postoperative vent management  Assessment & Plan:  Severe MR (s/p prior mitral clip x 2 in 2020) now s/p tissue MVR Moderate TR s/p TV repair  Moderate PHTN> progressed to severe postop; PA pressures significantly proved with volume removal today NICM, HFrEF, EF 40-45% Post bypass vasoplegia and cardioplegia  Postop tamponade resolved after relook and washout POD #0 Paroxysmal A-flutter, Afib. -post-op care per TCTS -run even on CRRT -D/c art line and swan -Continue to wean NE, epi, vaso -Continue midodrine  -Continue Heparin  gtt and starting Coumadin  today per TCTS  Mixed cardiogenic and distributive shock -con't pressors for SBP >100 -complete course of antibiotics on 1/9- mero, vanc  Acute pulmonary edema  Acute respiratory failure with hypoxia requiring mechanical ventilation - s/p extubation 1/5 - Running CRRT net even - Out of bed mobility as tolerated, pulmonary hygiene - Wean supplemental oxygen as able to maintain SpO2 greater than 90%  Expected post-operative ABLA Post-op thrombocytopenia-improving - Transfuse for hemoglobin less than 7 or hemodynamically significant bleeding  AKI on CKD3b now query cardiorenal vs. ATN from initial hemodynamic insults. - Continue CRRT - Bladder scanning protocol with Foley discontinued - Renally dose meds and avoid nephrotoxic  medications  Possible infection; no identified source - Complete 7-day course of vancomycin  and meropenem  on 1/9  Hyperbilirubinemia- downtrending, likely from right heart failure decompensation. Also on Amiodarone  which could be slightly affecting clearance -Monitor periodically -Needs Amio  to hopefully maintain NSR -Avoid hepatotoxins  At risk for malnutrition - Continue tube feeding at goal -bedside PO trial today  Constipation; resolved> now diarrhea -Banatrol for diarrhea, monitor potassium closely  Deconditioning -Needs PT, OT.  Anticipate discharge will need CIR - CRRT unfortunately limits mobility.   Best Practice (right click and Reselect all SmartList Selections daily)   Diet/type: TF DVT prophylaxis systemic heparin , starting Coumadin  bridge Pressure ulcer(s): None GI prophylaxis: PPI Lines: Central line, Dialysis Catheter, and Arterial Line Foley:  Yes, and it is still needed Code Status:  full code Last date of multidisciplinary goals of care discussion [per primary]  CC time: 35 min.   Sammi Gore, PA - [facility] For pager details, please see AMION or use Epic chat  After 1900, please call [facility] for cross coverage needs 12/08/2023, 8:32 AM

## 2023-12-08 NOTE — Progress Notes (Signed)
 Patient ID: Vanessa [name], female   DOB: October 04, 1951, 73 y.o.   MRN: [id number]      Advanced Heart Failure Rounding Note  Cardiologist: Dr. Rolan Chief Complaint: Cardiogenic shock  Subjective:   S/P MVR (tissue) and TV ring and same day return to OR for tamponade/bleeding. 1/2: Progressive volume overload with poor UOP, CVVH started but ended up re-intubated.   Extubated 12/05/23  - Converted to NSR today. Suspect because she's coming off if NE - Levophed  @3mcg ; epinephrine  @5 , vasopressin  @0 .04 - No acute events overnight - CVP 8; PA53/31 (39) with CI 2.39, PCWP 18  Objective:   Weight Range: 79.1 kg Body mass index is 30.89 kg/m.   Vital Signs:   Temp:  [98.1 F (36.7 C)-99.5 F (37.5 C)] 99 F (37.2 C) (01/08 0945) Pulse Rate:  [64-101] 90 (01/08 1000) Resp:  [18-32] 25 (01/08 1000) BP: (90-126)/(49-78) 94/56 (01/08 1000) SpO2:  [92 %-99 %] 94 % (01/08 1000) Arterial Line BP: (90-130)/(40-65) 98/48 (01/08 1000) FiO2 (%):  [24 %] 24 % (01/08 0744) Weight:  [79.1 kg] 79.1 kg (01/08 0500) Last BM Date : 12/08/23  Weight change: Filed Weights   12/06/23 0430 12/07/23 0500 12/08/23 0500  Weight: 81.9 kg 81.3 kg 79.1 kg   Intake/Output:   Intake/Output Summary (Last 24 hours) at 12/08/2023 1046 Last data filed at 12/08/2023 1000 Gross per 24 hour  Intake 3947.85 ml  Output 3743 ml  Net 204.85 ml    Physical Exam  CVP 8 General:  frail appearing.  HEENT: +cortrak Neck: supple. JVD ~8. Carotids 2+ bilat; no bruits. No lymphadenopathy or thyromegaly appreciated. Swann RIJ. LIH trialysis Cor: PMI nondisplaced. Regular rate & rhythm. No rubs, gallops or murmurs. Lungs: clear, diminished bases Abdomen: soft, nontender, nondistended. No hepatosplenomegaly. No bruits or masses. Good bowel sounds. Extremities: no cyanosis, clubbing, rash, edema. R radial a line GI/GU: +flexi Neuro: alert & oriented x 3, cranial nerves grossly intact. moves all 4 extremities w/o  difficulty. Affect pleasant.  Telemetry   NSR 80s-90s (Personally reviewed)    Labs    CBC Recent Labs    12/06/23 0418 12/07/23 0354  WBC 15.4* 16.6*  HGB 8.9* 8.9*  HCT 26.9* 26.2*  MCV 87.1 86.8  PLT 124* 153   Basic Metabolic Panel Recent Labs    98/92/74 1617 12/08/23 0338 12/08/23 0339  NA 136  --  135  K 4.1  --  4.3  CL 101  --  102  CO2 21*  --  23  GLUCOSE 160*  --  150*  BUN 39*  --  36*  CREATININE 1.79*  --  1.68*  CALCIUM  7.9*  --  8.1*  MG  --  2.6*  --   PHOS 2.7  --  2.8   Liver Function Tests Recent Labs    12/05/23 1217 12/05/23 1624 12/06/23 1103 12/06/23 1544 12/07/23 1617 12/08/23 0339  AST 149*  --  108*  --   --   --   ALT 31  --  39  --   --   --   ALKPHOS 78  --  74  --   --   --   BILITOT 13.4*  --  13.2*  --   --   --   PROT 6.7  --  6.4*  --   --   --   ALBUMIN  3.3*   < > 3.1*   < > 3.0* 2.9*   < > = values in  this interval not displayed.   No results for input(s): LIPASE, AMYLASE in the last 72 hours. Cardiac Enzymes No results for input(s): CKTOTAL, CKMB, CKMBINDEX, TROPONINI in the last 72 hours.  BNP: BNP (last 3 results) Recent Labs    05/26/23 1610 08/26/23 1103 09/22/23 0905  BNP 778.0* 781.0* 926.0*     Other results:  Imaging   Port CXR Result Date: 12/07/2023 CLINICAL DATA:  Line placement EXAM: PORTABLE CHEST 1 VIEW COMPARISON:  X-ray 12/06/2023 and older. FINDINGS: Film is rotated to the left. Enlarged cardiopericardial silhouette with sternal wires. Vascular congestion. Persistent left retrocardiac opacity. No pneumothorax. Overlapping cardiac leads. Double-lumen left subclavian line with the tip overlying the SVC. Feeding tube extending beneath the diaphragm. Tip not included in the imaging field. Right IJ Swan-Ganz catheter with the tip overlying the left pulmonary artery. IMPRESSION: Stable tubes and lines. Enlarged heart with vascular congestion. Persistent left retrocardiac opacity.  Rotated radiograph Electronically Signed   By: Ranell Bring M.D.   On: 12/07/2023 17:50      Medications:     Scheduled Medications:  arformoterol   15 mcg Nebulization BID   Chlorhexidine  Gluconate Cloth  6 each Topical Daily   darbepoetin (ARANESP ) injection - NON-DIALYSIS  40 mcg Subcutaneous Q Tue-1800   fiber supplement (BANATROL TF)  60 mL Per Tube BID   Gerhardt's butt cream   Topical BID   influenza vaccine adjuvanted  0.5 mL Intramuscular Tomorrow-1000   insulin  aspart  0-24 Units Subcutaneous Q4H   midodrine   15 mg Per Tube TID WC   multivitamin  1 tablet Per Tube QHS   mouth rinse  15 mL Mouth Rinse 4 times per day   pantoprazole  (PROTONIX ) IV  40 mg Intravenous Q24H   revefenacin   175 mcg Nebulization Daily   sodium chloride  flush  3 mL Intravenous Q12H    Infusions:   prismasol  BGK 4/2.5 500 mL/hr at 12/08/23 0411    prismasol  BGK 4/2.5 300 mL/hr at 12/08/23 0411   sodium chloride  10 mL/hr at 12/07/23 1630   sodium chloride  10 mL/hr at 12/08/23 1000   sodium chloride  10 mL/hr at 12/08/23 1000   amiodarone  30 mg/hr (12/08/23 1000)   epinephrine  5 mcg/min (12/08/23 1000)   feeding supplement (VITAL AF 1.2 CAL) 1,000 mL (12/08/23 1027)   heparin  1,300 Units/hr (12/08/23 1026)   meropenem  (MERREM ) IV Stopped (12/08/23 0545)   norepinephrine  3 mcg/min (12/08/23 1016)   prismasol  BGK 4/2.5 1,000 mL/hr at 12/08/23 1005   vancomycin  Stopped (12/08/23 0508)   vasopressin  0.04 Units/min (12/08/23 1022)    PRN Medications: sodium chloride , artificial tears, bisacodyl  **OR** bisacodyl , docusate, heparin , HYDROmorphone  (DILAUDID ) injection, levalbuterol , ondansetron  (ZOFRAN ) IV, mouth rinse, oxyCODONE , pneumococcal 20-valent conjugate vaccine, sodium chloride , sodium chloride  flush   Patient Profile   Vanessa [name] is a 73 y.o. with history of nonischemic cardiomyopathy and severe MR s/p Mitral Clip x2 (2020), pHTN, and CKD IIIb.   Assessment/Plan  1. Post  Cardiotomy Cardiogenic/vasoplegic Shock: -Bioprosthetic MVR and tricuspid ring 11/29/23 with Dr. Maryjane. Pre-op  cath with no significant CAD.  Pre-op  echo with EF 40-45%, severe MR. Went back to the OR with post-op tamponade for clean-out.  Echo 12/31 showed EF 25-30%, moderate RV dysfunction, s/p bioprosthetic mitral valve replacement with trivial MR and tricuspid valve repair with trivial TR - Worsened on 1/2 and re-intubated in setting of volume overload, oliguric renal failure &  pulmonary hypertension  - Extubated on 12/04/22 - Levophed  at 0.04, Epi at 5,  vaso @0 .04; CI 2.4 L/min/m2.  PA pressures better today. Milrinone  stopped yesterday. Midodrine  added. Will increase to 15 mg TID today to hopefully be able to wean off more pressor support.   2. Mitral regurgitation:   - TEE in 11/19 with severe MR, restricted posterior leaflet.  She had Mitraclip x 2 in 1/20. Post-op echo showed mild MR and mild mitral stenosis.   - Echo in 9/24 showed MV s/p Mitraclip x 2 with mean gradient 6 mmHg and severe mitral regurgitation. TEE was then done to evaluate, showing 2 Mitraclips in place with mean gradient 4 mmHg and severe eccentric MR from between the 2 clips with ERO 0.54 cm^2.  She was not thought to be a candidate for an additional Mitraclip due to lack of room between the two existing clips. R/LHC (11/24) showed moderate mixed pulmonary arterial and pulmonary venous hypertension likely due to mitral valve regurgitation.  - S/p MV replacement and Tricuspid repair on 11/29/23 with Dr. Maryjane - Repeat limited TTE later this week  3. Ventricular ectopy  - Previously had frequent PVCs - Continue amiodarone  gtt at 30mg /hr; no longer having ectopy.   4. Pulmonary hypertension - Most recent RHC (11/24) with PVR 4.5, with moderate mixed pulmonary arterial and pulmonary venous hypertension likely due to mitral valve regurgitation. Now s/p MVR, PA systolic pressure lower today in 60s.  Suspect still mixed  PAH/PVH with elevated PCWP.  NO was tried at 10 ppm but did not have much effect so now off.   - Will hold off on pulmonary vasodilators; small clinical trials demonstrate no improvement and some markers of harm with PDE5i in Group II PH 2/2 mitral valve disease despite MVR.  - PA pressure 53/31 (39), PCWP of 18; significant TPG consistent with some degree of fixed PH. Stable off milrinone    5. Toxic multinodular goiter: She is now off methimazole . TSH was normal in 8/24. Follows with Endocrinology  6. AKI on CKD stage 3 - Discussed with nephrology - Plan for -50 today with higher CVP  7. Blood loss anemia - Hgb stable.   8. Complete heart block: She had post-op CHB.  NSR today  9. Hypoxic / Hypercapnic respiratory failure - extubated; awake.   10. Atrial fibrillation: Patient previously in AF with controlled rate. Continue amio gtt. Today in NSR 90s - Continue heparin  gtt - Continue to wean off pressors to hopefully keep out of a fib  11. ID:  - WBC 17>15>16 - continue vanc/merrem  (day 6)  12. Thrombocytopenia - Improving; plt ct 153  13. Deconditioning - Chronic deconditioning; will need extensive PT. Pulling Vanessa [name] today  Vanessa [name] LITTIE Coe 12/08/2023 10:46 AM

## 2023-12-08 NOTE — Progress Notes (Signed)
 [address]       [place] [address]             [phone]      9 Days Post-Op  Procedure(s) (LRB): EXPLORATION POST OPERATIVE OPEN HEART (N/A)   Total Length of Stay:  LOS: 9 days    SUBJECTIVE: Slightly depressed  Vitals:   12/08/23 0700 12/08/23 0715  BP: (!) 90/57   Pulse: 85 90  Resp: 18 (!) 23  Temp: 98.6 F (37 C) 98.8 F (37.1 C)  SpO2: 95% 94%    Intake/Output      01/07 0701 01/08 0700 01/08 0701 01/09 0700   I.V. (mL/kg) 1851.2 (23.4)    Other 30    NG/GT 1510    IV Piggyback 563    Total Intake(mL/kg) 3954.3 (50)    Emesis/NG output 0    Stool 465    CRRT 3255    Total Output 3720    Net +234.3              prismasol  BGK 4/2.5 500 mL/hr at 12/08/23 0411    prismasol  BGK 4/2.5 300 mL/hr at 12/08/23 0411   sodium chloride  10 mL/hr at 12/07/23 1630   sodium chloride  10 mL/hr at 12/08/23 0700   sodium chloride  10 mL/hr at 12/08/23 0700   amiodarone  30 mg/hr (12/08/23 0700)   epinephrine  5 mcg/min (12/08/23 0719)   feeding supplement (VITAL AF 1.2 CAL) 55 mL/hr at 12/08/23 0700   heparin  1,300 Units/hr (12/08/23 0700)   meropenem  (MERREM ) IV Stopped (12/08/23 0545)   milrinone  Stopped (12/07/23 0832)   norepinephrine  3 mcg/min (12/08/23 0700)   prismasol  BGK 4/2.5 1,000 mL/hr at 12/08/23 0411   vancomycin  Stopped (12/08/23 0508)   vasopressin  0.04 Units/min (12/08/23 0700)    CBC    Component Value Date/Time   WBC 16.6 (H) 12/07/2023 0354   RBC 3.02 (L) 12/07/2023 0354   HGB 8.9 (L) 12/07/2023 0354   HCT 26.2 (L) 12/07/2023 0354   PLT 153 12/07/2023 0354   MCV 86.8 12/07/2023 0354   MCH 29.5 12/07/2023 0354   MCHC 34.0 12/07/2023 0354   RDW 17.4 (H) 12/07/2023 0354   LYMPHSABS 1.1 07/24/2022 1528   MONOABS 0.6 07/24/2022 1528   EOSABS 0.0 07/24/2022 1528   BASOSABS 0.0 07/24/2022 1528   CMP     Component Value Date/Time   NA 135 12/08/2023 0339   K 4.3 12/08/2023 0339   CL 102 12/08/2023 0339    CO2 23 12/08/2023 0339   GLUCOSE 150 (H) 12/08/2023 0339   BUN 36 (H) 12/08/2023 0339   CREATININE 1.68 (H) 12/08/2023 0339   CREATININE 1.30 (H) 04/29/2016 1144   CALCIUM  8.1 (L) 12/08/2023 0339   PROT 6.4 (L) 12/06/2023 1103   ALBUMIN  2.9 (L) 12/08/2023 0339   AST 108 (H) 12/06/2023 1103   ALT 39 12/06/2023 1103   ALKPHOS 74 12/06/2023 1103   BILITOT 13.2 (H) 12/06/2023 1103   GFRNONAA 32 (L) 12/08/2023 0339   GFRAA 42 (L) 08/19/2020 1304   ABG    Component Value Date/Time   PHART 7.414 12/05/2023 0948   PCO2ART 37.0 12/05/2023 0948   PO2ART 124 (H) 12/05/2023 0948   HCO3 23.7 12/05/2023 0948   TCO2 25 12/05/2023 0948   ACIDBASEDEF 1.0 12/05/2023 0948   O2SAT 65.3 12/08/2023 0339   CBG (last 3)  Recent Labs    12/07/23 2332 12/08/23 0337 12/08/23 0728  GLUCAP 154* 140* 143*  EXAM Lungs: clear Card: RR Ext: warm Neuro: intact   ASSESSMENT: POD #9 SP MV replacement TV repair  Hemodynamics continue to be stable on less inotropes. Will continue to wean. DC swan and art line Has converted to NSR  On Heparin . Start coumadin  CRRT as per Renal Start po diet   Deward Kallman, MD 12/08/2023

## 2023-12-08 NOTE — Progress Notes (Signed)
 PHARMACY - ANTICOAGULATION CONSULT NOTE  Pharmacy Consult for IV heparin  Indication: atrial fibrillation  No Known Allergies  Patient Measurements: Height: 5' 3 (160 cm) Weight: 81.3 kg (179 lb 3.7 oz) IBW/kg (Calculated) : 52.4 Heparin  Dosing Weight: ~ 70 kg  Vital Signs: Temp: 98.4 F (36.9 C) (01/08 0500) Temp Source: Core (01/08 0400) BP: 109/60 (01/08 0500) Pulse Rate: 93 (01/08 0500)  Labs: Recent Labs    12/05/23 1301 12/05/23 1624 12/06/23 0418 12/06/23 1303 12/07/23 0354 12/07/23 1617 12/07/23 1830 12/08/23 0339  HGB 9.4*  --  8.9*  --  8.9*  --   --   --   HCT 28.2*  --  26.9*  --  26.2*  --   --   --   PLT 104*  --  124*  --  153  --   --   --   APTT  --    < > 58*   < > 60*  --  60* 68*  HEPARINUNFRC  --    < > 0.22*  --  0.22*  --   --  0.26*  CREATININE  --    < > 1.66*   < > 1.60* 1.79*  --  1.68*   < > = values in this interval not displayed.    Estimated Creatinine Clearance: 30.6 mL/min (A) (by C-G formula based on SCr of 1.68 mg/dL (H)).   Assessment: 73 yo female s/p MVR now with afib, pharmacy asked to start anticoagulation with IV heparin .  aPTT therapeutic at 68 sec, on 1250 units/hr. No s/sx of bleeding or infusion issues. Total bilirudin still elevated at 13.2 on 1/6 so utilizing aPTT for monitoring as elevated tbili can affect heparin  levels.     Goal of Therapy:  Heparin  level 0.3-0.5 units/ml aPTT 66-85 sec Monitor platelets by anticoagulation protocol: Yes   Plan:  Increase heparin  infusion to 1300 units/hr to keep within goal Confirmatory aPTT in 8 hours CBC daily  Thank you for allowing pharmacy to participate in this patient's care,  Lynwood Poplar, PharmD. Clinical Pharmacist 12/08/2023 5:08 AM

## 2023-12-08 NOTE — Progress Notes (Signed)
 PHARMACY - ANTICOAGULATION CONSULT NOTE  Pharmacy Consult for IV heparin  Indication: atrial fibrillation  No Known Allergies  Patient Measurements: Height: 5' 3 (160 cm) Weight: 79.1 kg (174 lb 6.1 oz) IBW/kg (Calculated) : 52.4 Heparin  Dosing Weight: ~ 70 kg  Vital Signs: Temp: 98.8 F (37.1 C) (01/08 1200) Temp Source: Axillary (01/08 1200) BP: 99/63 (01/08 1300) Pulse Rate: 90 (01/08 1415)  Labs: Recent Labs    12/06/23 0418 12/06/23 1303 12/07/23 0354 12/07/23 1617 12/07/23 1830 12/08/23 0339 12/08/23 1142  HGB 8.9*  --  8.9*  --   --   --   --   HCT 26.9*  --  26.2*  --   --   --   --   PLT 124*  --  153  --   --   --   --   APTT 58*   < > 60*  --  60* 68* 53*  HEPARINUNFRC 0.22*  --  0.22*  --   --  0.26*  --   CREATININE 1.66*   < > 1.60* 1.79*  --  1.68*  --    < > = values in this interval not displayed.    Estimated Creatinine Clearance: 30.2 mL/min (A) (by C-G formula based on SCr of 1.68 mg/dL (H)).   Assessment: 73 yo female s/p MVR now with afib, pharmacy asked to start anticoagulation with IV heparin .  aPTT subtherapeutic at 53 sec, on 1500 units/hr. No s/sx of bleeding or infusion issues. Infusion was briefly held around 10AM for 15 minutes, should not have impacted 1142. Total bilirudin still elevated at 13.2 on 1/6 so utilizing aPTT for monitoring as elevated tbili can affect heparin  levels.     Goal of Therapy:  Heparin  level 0.3-0.5 units/ml aPTT 66-85 sec Monitor platelets by anticoagulation protocol: Yes   Plan:  Increase heparin  infusion to 1500 units/hr (~3 u/kg/h) Follow up aPTT in 8 hours CBC daily and PRN  Thank you for allowing pharmacy to participate in this patient's care,  Randall Call, PharmD PGY2 Critical Care Pharmacy Resident 12/08/2023 3:05 PM

## 2023-12-08 NOTE — Progress Notes (Signed)
 Pharmacy Antibiotic Note  Vanessa [name] is a 73 y.o. female admitted on 11/29/2023 for valve repair, now w/ concern for sepsis given pressor requirements and leukocytosis.  Pharmacy has been consulted for vancomycin  and meropenem  dosing.  Pt on CRRT. Planning for 7 days total so holding on vanc level. Stop dates in place.  Plan: Vancomycin  2000mg  x1 then 1000mg  IV every 24 hours.  Goal trough 15-20 mcg/mL. Meropenem  1g IV Q8H F/u CRRT plans, currently still pulling -50/h  Height: 5' 3 (160 cm) Weight: 79.1 kg (174 lb 6.1 oz) IBW/kg (Calculated) : 52.4  Temp (24hrs), Avg:98.9 F (37.2 C), Min:98.1 F (36.7 C), Max:99.5 F (37.5 C)  Recent Labs  Lab 12/01/23 1154 12/01/23 2131 12/02/23 1653 12/03/23 0319 12/03/23 0437 12/03/23 1614 12/04/23 0436 12/04/23 1656 12/05/23 0349 12/05/23 1301 12/05/23 1624 12/06/23 0418 12/06/23 1544 12/07/23 0354 12/07/23 1617 12/08/23 0339  WBC  --    < >  --    < >  --   --  15.1*  --  16.3* 17.1*  --  15.4*  --  16.6*  --   --   CREATININE  --    < >  --    < >  --    < > 1.86*   < > 1.67*  --    < > 1.66* 1.64* 1.60* 1.79* 1.68*  LATICACIDVEN 1.3  --  1.0  --  1.1  --   --   --   --   --   --   --   --   --   --   --    < > = values in this interval not displayed.    Estimated Creatinine Clearance: 30.2 mL/min (A) (by C-G formula based on SCr of 1.68 mg/dL (H)).    No Known Allergies  Thank you for allowing pharmacy to be a part of this patient's care.  Randall Call, PharmD PGY2 Critical Care Pharmacy Resident 12/08/2023 10:12 AM

## 2023-12-08 NOTE — Progress Notes (Signed)
 [name] Progress Note   Assessment/ Plan:   Vanessa [name] is a/an 73 y.o. female with a past medical history HTN, NICM, CHF, pulmonary hypertension, mitral and tricuspid valve disease  who present w/ surgery for valvular disease c/b shock, AHRF, and AKI    Oliguric AKI on CKD 3A: Mild baseline CKD likely multifactorial.  Now with AKI secondary to ATN and cardiorenal syndrome in the setting of shock. Started on CRRT on 1/2 for worsening pulmonary edema and volume overload. Dialysis catheter placed by CCM, appreciate help - continue CRRT - all 4k fluids  - UF goal 50 ml/hr as tolerated.  Please page nephrology for UF goal changes -Monitor Daily I/Os, Daily weight  -pharmacy is assisting with medication dosing for CRRT including meropenem  -Maintain MAP>65 for optimal renal perfusion.    Multifactorial shock: Vasoplegic and cardioplegia catheter surgery.   - Has been on pressors and inotropes.  Now of milrinone .  Continue management per primary team - pressor requirement decreasing overall  - additionally on antibiotics (meropenem  and vanc) and is s/p stress dose steroids    Acute exacerbation of heart failure with reduced ejection fraction: Volume overload.  - Optimize volume with CRRT as above    Severe MR and tricuspid valvular disease:  - Status post repair.  CT surgery managing   Postoperative cardiac tamponade:  - Continue monitoring per cardiology   Hyponatremia: In the setting of AKI, improved on CRRT.     Anemia: Related to blood loss and compounded by AKI and critical illness.   Transfusions per primary team.  Started aranesp  40 mcg weekly on Tuesdays   Hyperbilirubinemia - per primary team.  Increasing.   Disposition - in ICU on CRRT   Subjective:    Seen and examined on CRRT.  Procedure supervised.  She had 2.9 liters UF over 1/7 with CRRT as of this note.  UF goal has been 50 ml/hr.  Filter was changed overnight.  She has been on levophed  at 4  mcg/min, epi at 5 mcg/min, vasopressin  at 0.04 units/min, and amio.  Off milrinone .  She has been weaned to 1 liter nasal cannula.  She states that she has been miserable - sleep has been off and on.  Awake on arrival.   Review of systems:    Denies shortness of breath or air hunger Denies chest pain  Denies n/v Not sleeping well    Objective:   BP 109/60   Pulse 93   Temp 98.6 F (37 C)   Resp (!) 32   Ht 5' 3 (1.6 m)   Wt 81.3 kg   SpO2 94%   BMI 31.75 kg/m   Intake/Output Summary (Last 24 hours) at 12/08/2023 0540 Last data filed at 12/08/2023 0500 Gross per 24 hour  Intake 4026.05 ml  Output 3831 ml  Net 195.05 ml   Weight change:   Physical Exam:   General elderly female in no acute distress HEENT normocephalic atraumatic extraocular movements intact sclera anicteric Neck supple trachea midline Lungs clear on auscultation bilaterally normal work of breathing at rest on 1 liters oxygen Heart S1S2 no rub Abdomen soft nontender nondistended Extremities no lower extremity edema appreciated Psych no anxiety or agitation Neuro - alert and interactive; conversant and follows commands Access left internal jugular non-tunneled dialysis catheter   Imaging: Port CXR Result Date: 12/07/2023 CLINICAL DATA:  Line placement EXAM: PORTABLE CHEST 1 VIEW COMPARISON:  X-ray 12/06/2023 and older. FINDINGS: Film is rotated to the left. Enlarged  cardiopericardial silhouette with sternal wires. Vascular congestion. Persistent left retrocardiac opacity. No pneumothorax. Overlapping cardiac leads. Double-lumen left subclavian line with the tip overlying the SVC. Feeding tube extending beneath the diaphragm. Tip not included in the imaging field. Right IJ Swan-Ganz catheter with the tip overlying the left pulmonary artery. IMPRESSION: Stable tubes and lines. Enlarged heart with vascular congestion. Persistent left retrocardiac opacity. Rotated radiograph Electronically Signed   By: Ranell Bring  M.D.   On: 12/07/2023 17:50    Labs: BMET Recent Labs  Lab 12/05/23 0349 12/05/23 0948 12/05/23 1624 12/06/23 0418 12/06/23 1544 12/07/23 0354 12/07/23 1617 12/08/23 0339  NA 135 136 135 135 136 137 136 135  K 3.8 3.8 3.8 3.8 3.8 3.6 4.1 4.3  CL 101  --  102 99 101 102 101 102  CO2 20*  --  22 22 22 23  21* 23  GLUCOSE 154*  --  143* 172* 129* 131* 160* 150*  BUN 39*  --  42* 46* 46* 36* 39* 36*  CREATININE 1.67*  --  1.68* 1.66* 1.64* 1.60* 1.79* 1.68*  CALCIUM  8.9  --  8.7* 8.6* 8.5* 8.6* 7.9* 8.1*  PHOS 2.0*  --  1.8* 3.2 2.3* 2.5 2.7 2.8   CBC Recent Labs  Lab 12/05/23 0349 12/05/23 0948 12/05/23 1301 12/06/23 0418 12/07/23 0354  WBC 16.3*  --  17.1* 15.4* 16.6*  HGB 9.6* 10.5* 9.4* 8.9* 8.9*  HCT 29.0* 31.0* 28.2* 26.9* 26.2*  MCV 87.6  --  87.0 87.1 86.8  PLT 97*  --  104* 124* 153    Medications:     acetaminophen  (TYLENOL ) oral liquid 160 mg/5 mL  650 mg Per Tube Once   arformoterol   15 mcg Nebulization BID   Chlorhexidine  Gluconate Cloth  6 each Topical Daily   darbepoetin (ARANESP ) injection - NON-DIALYSIS  40 mcg Subcutaneous Q Tue-1800   fiber supplement (BANATROL TF)  60 mL Per Tube BID   Gerhardt's butt cream   Topical BID   influenza vaccine adjuvanted  0.5 mL Intramuscular Tomorrow-1000   insulin  aspart  0-24 Units Subcutaneous Q4H   midodrine   10 mg Per Tube TID WC   multivitamin  1 tablet Per Tube QHS   mouth rinse  15 mL Mouth Rinse 4 times per day   pantoprazole  (PROTONIX ) IV  40 mg Intravenous Q24H   revefenacin   175 mcg Nebulization Daily   sodium chloride  flush  3 mL Intravenous Q12H   Vanessa JAYSON Saba, MD 12/08/2023, 5:55 AM

## 2023-12-08 NOTE — Progress Notes (Addendum)
 Physical Therapy Treatment Patient Details Name: Vanessa [name] MRN: [id number] DOB: 07/28/1951 Today's Date: 12/08/2023   History of Present Illness 73 yo F admitted 12/30 with DOE, increasing MR and TR. LCH without CAD. 12/30 MVR and tricuspid valve repair. Intubated 1/2-1/5. 1/2 CRRT started. PMhx: HTN, NICM, HFrEF (EF40-45%), Pulmonary HTN, CKD and  severe MR s/p prior mitral clip x 2 in 2020.    PT Comments  Pt making steady progress towards her physical therapy goals. Worked on bed level exercises for strengthening and transitioning from bed to chair. Pt requiring two person moderate assist for stand pivot transfers. Presents with generalized weakness, impaired standing balance, decreased activity tolerance. Patient will benefit from intensive inpatient follow up therapy, >3 hours/day. Suspect good progress based on PLOF and motivation.    If plan is discharge home, recommend the following: A lot of help with walking and/or transfers;A lot of help with bathing/dressing/bathroom;Assistance with cooking/housework   Can travel by private vehicle        Equipment Recommendations  Rolling walker (2 wheels);BSC/3in1    Recommendations for Other Services       Precautions / Restrictions Precautions Precautions: Sternal;Fall;Other (comment) Precaution Comments: CRRT, flexiseal, cortrak Restrictions Weight Bearing Restrictions Per Provider Order: Yes (sternal precautions)     Mobility  Bed Mobility Overal bed mobility: Needs Assistance Bed Mobility: Supine to Sit     Supine to sit: Mod assist, +2 for physical assistance     General bed mobility comments: Cues for initiation, assist to fully progress BLE's off edge of bed, use of bed pad to scoot hips out. Assist at trunk to elevate    Transfers Overall transfer level: Needs assistance Equipment used: 2 person hand held assist Transfers: Sit to/from Stand, Bed to chair/wheelchair/BSC Sit to Stand: Mod assist, +2 physical  assistance Stand pivot transfers: Mod assist, +2 physical assistance         General transfer comment: ModA + 2 to rise from edge of bed x 2 with cues for keeping hands on knees, pivoting to right with increased time.    Ambulation/Gait                   Stairs             Wheelchair Mobility     Tilt Bed    Modified Rankin (Stroke Patients Only)       Balance Overall balance assessment: Needs assistance Sitting-balance support: No upper extremity supported, Feet supported Sitting balance-Leahy Scale: Fair     Standing balance support: Bilateral upper extremity supported Standing balance-Leahy Scale: Poor Standing balance comment: UB support on therapists in standing                            Cognition Arousal: Alert Behavior During Therapy: Flat affect Overall Cognitive Status: Impaired/Different from baseline Area of Impairment: Memory                     Memory: Decreased recall of precautions                  Exercises General Exercises - Lower Extremity Ankle Circles/Pumps: AROM, Both, 20 reps, Supine Quad Sets: AROM, Both, 10 reps, Supine Long Arc Quad: AROM, Both, 5 reps, Seated Heel Slides: AROM, Both, 5 reps, Supine Hip ABduction/ADduction: AROM, Both, 15 reps, 10 reps, Supine    General Comments  91 HR, 95% on 1L O2, BP 79/58 (66)  after sitting up in chair      Pertinent Vitals/Pain Pain Assessment Pain Assessment: No/denies pain    Home Living                          Prior Function            PT Goals (current goals can now be found in the care plan section) Acute Rehab PT Goals Patient Stated Goal: return home Potential to Achieve Goals: Good Progress towards PT goals: Progressing toward goals    Frequency    Min 1X/week      PT Plan      Co-evaluation              AM-PAC PT 6 Clicks Mobility   Outcome Measure  Help needed turning from your back to your side  while in a flat bed without using bedrails?: A Lot Help needed moving from lying on your back to sitting on the side of a flat bed without using bedrails?: A Lot Help needed moving to and from a bed to a chair (including a wheelchair)?: A Lot Help needed standing up from a chair using your arms (e.g., wheelchair or bedside chair)?: A Lot Help needed to walk in hospital room?: Total Help needed climbing 3-5 steps with a railing? : Total 6 Click Score: 10    End of Session Equipment Utilized During Treatment: Gait belt Activity Tolerance: Patient tolerated treatment well Patient left: in chair;with call bell/phone within reach Nurse Communication: Mobility status PT Visit Diagnosis: Other abnormalities of gait and mobility (R26.89);Muscle weakness (generalized) (M62.81);Difficulty in walking, not elsewhere classified (R26.2)     Time: 8663-8594 PT Time Calculation (min) (ACUTE ONLY): 29 min  Charges:    $Therapeutic Exercise: 8-22 mins $Therapeutic Activity: 8-22 mins PT General Charges $$ ACUTE PT VISIT: 1 Visit                     Vanessa [name], PT, DPT Acute Rehabilitation Services Office [phone]    Vanessa [name] 12/08/2023, 4:26 PM

## 2023-12-08 NOTE — Plan of Care (Signed)
   Problem: Clinical Measurements: Goal: Ability to maintain clinical measurements within normal limits will improve Outcome: Progressing

## 2023-12-08 NOTE — Progress Notes (Signed)
 eLink Physician-Brief Progress Note Patient Name: Vanessa [name] DOB: [birth year] MRN: [id number]   Date of Service  12/08/2023  HPI/Events of Note  POD #9 SP MV replacement TV repair in cardiogenic shock and renal failure complicated by respiratory failure  SBP goal >100 SBP below 100 with maps in the 70s-80s.  eICU Interventions  Maintain current parameters. Measurement of 98/77 is inconsistent with other readings as the pulse pressure is quite narrow. Wean epinephrine  and then norepinephrine , will discontinue vasopressin  last.     Intervention Category Intermediate Interventions: Hypotension - evaluation and management  Rosie Torrez 12/08/2023, 8:29 PM

## 2023-12-09 ENCOUNTER — Inpatient Hospital Stay (HOSPITAL_COMMUNITY): Payer: PPO

## 2023-12-09 DIAGNOSIS — N179 Acute kidney failure, unspecified: Secondary | ICD-10-CM | POA: Diagnosis not present

## 2023-12-09 DIAGNOSIS — R197 Diarrhea, unspecified: Secondary | ICD-10-CM

## 2023-12-09 DIAGNOSIS — Z952 Presence of prosthetic heart valve: Secondary | ICD-10-CM | POA: Diagnosis not present

## 2023-12-09 DIAGNOSIS — J9601 Acute respiratory failure with hypoxia: Secondary | ICD-10-CM | POA: Diagnosis not present

## 2023-12-09 DIAGNOSIS — R570 Cardiogenic shock: Secondary | ICD-10-CM | POA: Diagnosis not present

## 2023-12-09 DIAGNOSIS — I272 Pulmonary hypertension, unspecified: Secondary | ICD-10-CM | POA: Diagnosis not present

## 2023-12-09 LAB — COOXEMETRY PANEL
Carboxyhemoglobin: 2.2 % — ABNORMAL HIGH (ref 0.5–1.5)
Methemoglobin: <0.7 % (ref 0.0–1.5)
O2 Saturation: 60 %
Total hemoglobin: 9.8 g/dL — ABNORMAL LOW (ref 12.0–16.0)

## 2023-12-09 LAB — RENAL FUNCTION PANEL
Albumin: 3.1 g/dL — ABNORMAL LOW (ref 3.5–5.0)
Albumin: 3.2 g/dL — ABNORMAL LOW (ref 3.5–5.0)
Anion gap: 10 (ref 5–15)
Anion gap: 14 (ref 5–15)
BUN: 29 mg/dL — ABNORMAL HIGH (ref 8–23)
BUN: 30 mg/dL — ABNORMAL HIGH (ref 8–23)
CO2: 24 mmol/L (ref 22–32)
CO2: 21 mmol/L — ABNORMAL LOW (ref 22–32)
Calcium: 8.5 mg/dL — ABNORMAL LOW (ref 8.9–10.3)
Calcium: 8.4 mg/dL — ABNORMAL LOW (ref 8.9–10.3)
Chloride: 100 mmol/L (ref 98–111)
Chloride: 100 mmol/L (ref 98–111)
Creatinine, Ser: 1.49 mg/dL — ABNORMAL HIGH (ref 0.44–1.00)
Creatinine, Ser: 1.53 mg/dL — ABNORMAL HIGH (ref 0.44–1.00)
GFR, Estimated: 37 mL/min — ABNORMAL LOW (ref >60)
GFR, Estimated: 36 mL/min — ABNORMAL LOW (ref >60)
Glucose, Bld: 168 mg/dL — ABNORMAL HIGH (ref 70–99)
Glucose, Bld: 125 mg/dL — ABNORMAL HIGH (ref 70–99)
Phosphorus: 2.6 mg/dL (ref 2.5–4.6)
Phosphorus: 3.2 mg/dL (ref 2.5–4.6)
Potassium: 4.6 mmol/L (ref 3.5–5.1)
Potassium: 5 mmol/L (ref 3.5–5.1)
Sodium: 134 mmol/L — ABNORMAL LOW (ref 135–145)
Sodium: 135 mmol/L (ref 135–145)

## 2023-12-09 LAB — CBC WITH DIFFERENTIAL/PLATELET
Abs Immature Granulocytes: 0 10*3/uL (ref 0.00–0.07)
Basophils Absolute: 0.2 10*3/uL — ABNORMAL HIGH (ref 0.0–0.1)
Basophils Relative: 1 %
Eosinophils Absolute: 0 10*3/uL (ref 0.0–0.5)
Eosinophils Relative: 0 %
HCT: 29.7 % — ABNORMAL LOW (ref 36.0–46.0)
Hemoglobin: 9.7 g/dL — ABNORMAL LOW (ref 12.0–15.0)
Lymphocytes Relative: 11 %
Lymphs Abs: 2.2 10*3/uL (ref 0.7–4.0)
MCH: 29.2 pg (ref 26.0–34.0)
MCHC: 32.7 g/dL (ref 30.0–36.0)
MCV: 89.5 fL (ref 80.0–100.0)
Monocytes Absolute: 0.6 10*3/uL (ref 0.1–1.0)
Monocytes Relative: 3 %
Neutro Abs: 16.7 10*3/uL — ABNORMAL HIGH (ref 1.7–7.7)
Neutrophils Relative %: 85 %
Platelets: 159 10*3/uL (ref 150–400)
RBC: 3.32 MIL/uL — ABNORMAL LOW (ref 3.87–5.11)
RDW: 20 % — ABNORMAL HIGH (ref 11.5–15.5)
WBC: 19.7 10*3/uL — ABNORMAL HIGH (ref 4.0–10.5)
nRBC: 2 /100{WBCs} — ABNORMAL HIGH
nRBC: 2.3 % — ABNORMAL HIGH (ref 0.0–0.2)

## 2023-12-09 LAB — HEPATIC FUNCTION PANEL
ALT: 37 U/L (ref 0–44)
AST: 44 U/L — ABNORMAL HIGH (ref 15–41)
Albumin: 3 g/dL — ABNORMAL LOW (ref 3.5–5.0)
Alkaline Phosphatase: 94 U/L (ref 38–126)
Bilirubin, Direct: 9.4 mg/dL — ABNORMAL HIGH (ref 0.0–0.2)
Indirect Bilirubin: 5.8 mg/dL — ABNORMAL HIGH (ref 0.3–0.9)
Total Bilirubin: 15.2 mg/dL — ABNORMAL HIGH (ref 0.0–1.2)
Total Protein: 7 g/dL (ref 6.5–8.1)

## 2023-12-09 LAB — HEPARIN LEVEL (UNFRACTIONATED): Heparin Unfractionated: 0.35 [IU]/mL (ref 0.30–0.70)

## 2023-12-09 LAB — GLUCOSE, CAPILLARY
Glucose-Capillary: 143 mg/dL — ABNORMAL HIGH (ref 70–99)
Glucose-Capillary: 151 mg/dL — ABNORMAL HIGH (ref 70–99)
Glucose-Capillary: 111 mg/dL — ABNORMAL HIGH (ref 70–99)
Glucose-Capillary: 130 mg/dL — ABNORMAL HIGH (ref 70–99)
Glucose-Capillary: 121 mg/dL — ABNORMAL HIGH (ref 70–99)

## 2023-12-09 LAB — APTT
aPTT: 71 s — ABNORMAL HIGH (ref 24–36)
aPTT: 57 s — ABNORMAL HIGH (ref 24–36)

## 2023-12-09 MED ORDER — SODIUM CHLORIDE 0.9 % IV SOLN: INTRAVENOUS | Status: AC | PRN

## 2023-12-09 MED ORDER — AMIODARONE LOAD VIA INFUSION
150.0000 mg | Freq: Once | INTRAVENOUS | Status: AC
  Administered 2023-12-09: 150 mg via INTRAVENOUS
  Filled 2023-12-09: qty 83.34

## 2023-12-09 MED ORDER — ENSURE ENLIVE PO LIQD
237.0000 mL | Freq: Two times a day (BID) | ORAL | Status: DC
  Administered 2023-12-12: 237 mL via ORAL

## 2023-12-09 MED ORDER — PRISMASOL BGK 0/2.5 32-2.5 MEQ/L EC SOLN
Status: DC
  Filled 2023-12-09 (×5): qty 5000

## 2023-12-09 NOTE — Progress Notes (Signed)
 PHARMACY - ANTICOAGULATION CONSULT NOTE  Pharmacy Consult for IV heparin  Indication: atrial fibrillation  No Known Allergies  Patient Measurements: Height: 5' 3 (160 cm) Weight: 79.1 kg (174 lb 6.1 oz) IBW/kg (Calculated) : 52.4 Heparin  Dosing Weight: ~ 70 kg  Vital Signs: Temp: 98.4 F (36.9 C) (01/08 2315) Temp Source: Oral (01/08 2315) BP: 103/60 (01/08 2345) Pulse Rate: 89 (01/08 2345)  Labs: Recent Labs    12/06/23 0418 12/06/23 1303 12/07/23 0354 12/07/23 1617 12/07/23 1830 12/08/23 0339 12/08/23 1142 12/08/23 1613 12/08/23 2317  HGB 8.9*  --  8.9*  --   --   --   --   --   --   HCT 26.9*  --  26.2*  --   --   --   --   --   --   PLT 124*  --  153  --   --   --   --   --   --   APTT 58*   < > 60*  --    < > 68* 53*  --  97*  HEPARINUNFRC 0.22*  --  0.22*  --   --  0.26*  --   --   --   CREATININE 1.66*   < > 1.60* 1.79*  --  1.68*  --  1.63*  --    < > = values in this interval not displayed.    Estimated Creatinine Clearance: 31.1 mL/min (A) (by C-G formula based on SCr of 1.63 mg/dL (H)).   Assessment: 73 yo female s/p MVR now with afib, pharmacy asked to start anticoagulation with IV heparin .  aPTT subtherapeutic at 53 sec, on 1500 units/hr. No s/sx of bleeding or infusion issues. Infusion was briefly held around 10AM for 15 minutes, should not have impacted 1142. Total bilirudin still elevated at 13.2 on 1/6 so utilizing aPTT for monitoring as elevated tbili can affect heparin  levels.   1/9 AM update:  aPTT supra-therapeutic    Goal of Therapy:  Heparin  level 0.3-0.5 units/ml aPTT 66-85 sec Monitor platelets by anticoagulation protocol: Yes   Plan:  Dec heparin  to 1350 units/hr Follow up aPTT and heparin  level in 8 hours CBC daily and PRN  Lynwood Mckusick, PharmD, BCPS Clinical Pharmacist Phone: [phone]

## 2023-12-09 NOTE — Progress Notes (Signed)
 Occupational Therapy Treatment Patient Details Name: Vanessa [name] MRN: [id number] DOB: [birth year] Today's Date: 12/09/2023   History of present illness 73 yo F admitted 12/30 with DOE, increasing MR and TR. LCH without CAD. 12/30 MVR and tricuspid valve repair. Intubated 1/2-1/5. 1/2 CRRT started. PMhx: HTN, NICM, HFrEF (EF40-45%), Pulmonary HTN, CKD and  severe MR s/p prior mitral clip x 2 in 2020.   OT comments  Pt making steady progress towards goals. Session focused on recall of sternal precautions w/ improving cognition noted, as well as DME options for transfers. Pt continues to require +2 assist for bed mobility but able to maintain sitting balance well once EOB. Trialed Stedy based on reported difficulties with transferring back to bed yesterday w/ pt able to demo good balance/posture in [place] (cued for hands on lap until in standing to safely reach Inez bars). Pt agreeable for continued RW trial with Mod A x 2 and hands on assist to correct posterior balance. Continue to recommend intensive rehab services at DC. Plan to progress BSC transfers vs standing ADLs at sink in next session.      If plan is discharge home, recommend the following:  Two people to help with walking and/or transfers;Two people to help with bathing/dressing/bathroom;Assistance with cooking/housework;Assist for transportation;Help with stairs or ramp for entrance   Equipment Recommendations  Other (comment) (TBD)    Recommendations for Other Services Rehab consult    Precautions / Restrictions Precautions Precautions: Sternal;Fall;Other (comment) Precaution Comments: CRRT, cortrak, watch vitals Restrictions Weight Bearing Restrictions Per Provider Order: No       Mobility Bed Mobility Overal bed mobility: Needs Assistance Bed Mobility: Supine to Sit, Sit to Supine     Supine to sit: Mod assist, +2 for safety/equipment, +2 for physical assistance, HOB elevated Sit to supine: Max assist, +2 for  physical assistance, +2 for safety/equipment   General bed mobility comments: Pt able to assist in lifting trunk to long sitting w/ assist to scoot hips to EOB using bed pad    Transfers Overall transfer level: Needs assistance Equipment used: Rolling walker (2 wheels), Ambulation equipment used Transfers: Sit to/from Stand Sit to Stand: Mod assist, +2 physical assistance, +2 safety/equipment           General transfer comment: Per RN, difficulty getting pt to transfer back to bed yesterday. Trialed with [facility] w/ cues to place legs on lap for transition and once upright then reach for Stedy bars. Trialed with RW and similar assist needed to stand though increased assist needed to correct posterior bias. ABle to take steps along bedside with assist for DME/lines. Pt politely declined OOB transfer at time of OT session     Balance Overall balance assessment: Needs assistance Sitting-balance support: No upper extremity supported, Feet supported Sitting balance-Leahy Scale: Fair     Standing balance support: Bilateral upper extremity supported Standing balance-Leahy Scale: Poor                             ADL either performed or assessed with clinical judgement   ADL Overall ADL's : Needs assistance/impaired                                       General ADL Comments: Focus on transfer strategies and reinforcement of sternal precautions    Extremity/Trunk Assessment Upper Extremity Assessment Upper Extremity Assessment:  Generalized weakness;Right hand dominant   Lower Extremity Assessment Lower Extremity Assessment: Defer to PT evaluation        Vision   Vision Assessment?: No apparent visual deficits   Perception     Praxis      Cognition Arousal: Alert Behavior During Therapy: WFL for tasks assessed/performed, Flat affect Overall Cognitive Status: Impaired/Different from baseline Area of Impairment: Memory                      Memory: Decreased recall of precautions         General Comments: able to recall 2/4 sternal precautions, flat affect but responds well to humor and follows one step commands well        Exercises      Shoulder Instructions       General Comments 94% on 1 L O2, trialed on RA though variable pleth signal    Pertinent Vitals/ Pain       Pain Assessment Pain Assessment: Faces Faces Pain Scale: Hurts a little bit Pain Location: generalized with movement Pain Descriptors / Indicators: Grimacing Pain Intervention(s): Monitored during session, Limited activity within patient's tolerance  Home Living                                          Prior Functioning/Environment              Frequency  Min 1X/week        Progress Toward Goals  OT Goals(current goals can now be found in the care plan section)  Progress towards OT goals: Progressing toward goals  Acute Rehab OT Goals Patient Stated Goal: feel better soon OT Goal Formulation: With patient Time For Goal Achievement: 12/20/23 Potential to Achieve Goals: Good ADL Goals Pt Will Perform Grooming: with contact guard assist;sitting Pt Will Perform Upper Body Dressing: with set-up;sitting Pt Will Perform Lower Body Dressing: with min assist;sit to/from stand Pt Will Transfer to Toilet: with min assist;ambulating Additional ADL Goal #1: Pt will identify and implement 2+ energy conservation techniques Additional ADL Goal #2: Pt will recall and implement sternal precautions during ADL  Plan      Co-evaluation                 AM-PAC OT 6 Clicks Daily Activity     Outcome Measure   Help from another person eating meals?: A Little Help from another person taking care of personal grooming?: A Little Help from another person toileting, which includes using toliet, bedpan, or urinal?: A Lot Help from another person bathing (including washing, rinsing, drying)?: A Lot Help from another  person to put on and taking off regular upper body clothing?: A Lot Help from another person to put on and taking off regular lower body clothing?: A Lot 6 Click Score: 14    End of Session Equipment Utilized During Treatment: Rolling walker (2 wheels)  OT Visit Diagnosis: Unsteadiness on feet (R26.81);Muscle weakness (generalized) (M62.81)   Activity Tolerance Patient tolerated treatment well   Patient Left in bed;with call bell/phone within reach;with nursing/sitter in room   Nurse Communication Mobility status (RN present)        Time: 9168-9145 OT Time Calculation (min): 23 min  Charges: OT General Charges $OT Visit: 1 Visit OT Treatments $Therapeutic Activity: 8-22 mins  Mliss NOVAK, OTR/L Acute Rehab Services Office: [phone]   Mliss  Britt 12/09/2023, 9:10 AM

## 2023-12-09 NOTE — Progress Notes (Signed)
 [facility] DAILY ICU PROGRESS NOTE                   [address]            [place] [address]          [phone]   10 Days Post-Op Procedure(s) (LRB): EXPLORATION POST OPERATIVE OPEN HEART (N/A)  Total Length of Stay:  LOS: 10 days   Subjective: Weak and deconditioned, alert , responds appropriatly  Objective: Vital signs in last 24 hours: Temp:  [98.1 F (36.7 C)-99 F (37.2 C)] 98.2 F (36.8 C) (01/09 0315) Pulse Rate:  [76-105] 83 (01/09 0730) Cardiac Rhythm: Atrial fibrillation (01/09 0400) Resp:  [17-35] 22 (01/09 0730) BP: (82-117)/(54-98) 89/65 (01/09 0730) SpO2:  [87 %-96 %] 93 % (01/09 0730) Arterial Line BP: (79-116)/(41-61) 104/57 (01/08 1715) Weight:  [78.2 kg] 78.2 kg (01/09 0500)  Filed Weights   12/07/23 0500 12/08/23 0500 12/09/23 0500  Weight: 81.3 kg 79.1 kg 78.2 kg    Weight change: -0.9 kg   Hemodynamic parameters for last 24 hours: PAP: (51-59)/(26-34) 51/26 CVP:  [4 mmHg-13 mmHg] 6 mmHg CO:  [4.7 L/min] 4.7 L/min CI:  [2.4 L/min/m2] 2.4 L/min/m2  Intake/Output from previous day: 01/08 0701 - 01/09 0700 In: 3616.1 [P.O.:10; I.V.:1486.3; NG/GT:1560; IV Piggyback:499.9] Out: 4783 [Stool:435]  Intake/Output this shift: No intake/output data recorded.  Current Meds: Scheduled Meds:  arformoterol   15 mcg Nebulization BID   Chlorhexidine  Gluconate Cloth  6 each Topical Daily   darbepoetin (ARANESP ) injection - NON-DIALYSIS  40 mcg Subcutaneous Q Tue-1800   fiber supplement (BANATROL TF)  60 mL Per Tube TID   Gerhardt's butt cream   Topical BID   influenza vaccine adjuvanted  0.5 mL Intramuscular Tomorrow-1000   insulin  aspart  0-24 Units Subcutaneous Q4H   midodrine   15 mg Per Tube TID WC   multivitamin  1 tablet Per Tube QHS   mouth rinse  15 mL Mouth Rinse 4 times per day   pantoprazole  (PROTONIX ) IV  40 mg Intravenous Q24H   revefenacin   175 mcg Nebulization Daily   sodium chloride  flush  3 mL Intravenous Q12H    Continuous Infusions:   prismasol  BGK 4/2.5 500 mL/hr at 12/09/23 0033    prismasol  BGK 4/2.5 300 mL/hr at 12/09/23 0033   sodium chloride  10 mL/hr at 12/07/23 1630   amiodarone  30 mg/hr (12/09/23 0700)   epinephrine  3 mcg/min (12/09/23 0700)   feeding supplement (VITAL AF 1.2 CAL) 55 mL/hr at 12/09/23 0700   heparin  1,350 Units/hr (12/09/23 0700)   meropenem  (MERREM ) IV Stopped (12/09/23 0612)   norepinephrine  Stopped (12/09/23 9376)   prismasol  BGK 4/2.5 1,000 mL/hr at 12/09/23 0540   vasopressin  0.04 Units/min (12/09/23 0700)   PRN Meds:.sodium chloride , artificial tears, bisacodyl  **OR** bisacodyl , docusate, heparin , HYDROmorphone  (DILAUDID ) injection, levalbuterol , ondansetron  (ZOFRAN ) IV, mouth rinse, oxyCODONE , pneumococcal 20-valent conjugate vaccine, sodium chloride , sodium chloride  flush  General appearance: alert, appears stated age, fatigued, and no distress Heart: irregularly irregular rhythm Lungs: fairly clear anteriorly Abdomen: soft, non-tender Extremities: no edema Wound: incis healing well  Lab Results: CBC: Recent Labs    12/07/23 0354  WBC 16.6*  HGB 8.9*  HCT 26.2*  PLT 153   BMET:  Recent Labs    12/08/23 1613 12/08/23 1745 12/09/23 0322  NA 133*  --  134*  K 5.0 4.5 4.6  CL 98  --  100  CO2 20*  --  24  GLUCOSE 162*  --  168*  BUN 38*  --  29*  CREATININE 1.63*  --  1.49*  CALCIUM  8.4*  --  8.5*    CMET: Lab Results  Component Value Date   WBC 16.6 (H) 12/07/2023   HGB 8.9 (L) 12/07/2023   HCT 26.2 (L) 12/07/2023   PLT 153 12/07/2023   GLUCOSE 168 (H) 12/09/2023   CHOL 178 08/11/2019   TRIG 90 11/30/2023   HDL 57 08/11/2019   LDLCALC 109 (H) 08/11/2019   ALT 37 12/09/2023   AST 44 (H) 12/09/2023   NA 134 (L) 12/09/2023   K 4.6 12/09/2023   CL 100 12/09/2023   CREATININE 1.49 (H) 12/09/2023   BUN 29 (H) 12/09/2023   CO2 24 12/09/2023   TSH 0.560 05/26/2023   INR 1.6 (H) 11/29/2023   HGBA1C 6.0 (H) 11/25/2023   MICROALBUR  3.1 (H) 08/11/2019      PT/INR: No results for input(s): LABPROT, INR in the last 72 hours. Radiology: No results found.   Assessment/Plan: S/P Procedure(s) (LRB): EXPLORATION POST OPERATIVE OPEN HEART (N/A) POD#10  1 afeb, s BP 82-117, afib w/ CVR, gtts- amio/epi/vaso- off levo- AHF team assisting w/ management, also on heparin  gtt, conts CRRT, oliguric- minimal urine in bladder by scan, BUN/Creat 29/1.49 2 O2 sats ok on 1 liter [place], conts nebs 3 BS well controlled- on SSI 4 full rate Tube feedings, started liquid diet- poor appetite 5 ID- on meropenem /vanco for 7 day 6 hyperbilirubinemia- trending higher, ? Abdominal US  7 therapies as able to tolerate     Lemond FORBES Cera PA-C Pager [phone] 12/09/2023 7:57 AM

## 2023-12-09 NOTE — Progress Notes (Signed)
 PHARMACY - ANTICOAGULATION CONSULT NOTE  Pharmacy Consult for IV heparin  Indication: atrial fibrillation  No Known Allergies  Patient Measurements: Height: 5' 3 (160 cm) Weight: 78.2 kg (172 lb 6.4 oz) IBW/kg (Calculated) : 52.4 Heparin  Dosing Weight: ~ 70 kg  Vital Signs: Temp: 98.4 F (36.9 C) (01/09 1600) Temp Source: Axillary (01/09 1600) BP: 116/70 (01/09 1730) Pulse Rate: 102 (01/09 1730)  Labs: Recent Labs    12/07/23 0354 12/07/23 1617 12/08/23 0339 12/08/23 1142 12/08/23 1613 12/08/23 2317 12/09/23 0322 12/09/23 0837 12/09/23 1001 12/09/23 1613  HGB 8.9*  --   --   --   --   --   --   --  9.7*  --   HCT 26.2*  --   --   --   --   --   --   --  29.7*  --   PLT 153  --   --   --   --   --   --   --  159  --   APTT 60*   < > 68*   < >  --  97*  --  71*  --  57*  HEPARINUNFRC 0.22*  --  0.26*  --   --   --   --  0.35  --   --   CREATININE 1.60*   < > 1.68*  --  1.63*  --  1.49*  --   --  1.53*   < > = values in this interval not displayed.    Estimated Creatinine Clearance: 32.9 mL/min (A) (by C-G formula based on SCr of 1.53 mg/dL (H)).   Assessment: 73 yo female s/p scheduled bioprosthetic mitral valve replacement with tissue valve 12/30 now with afib, pharmacy asked to start anticoagulation with IV heparin . Spontaneously converted 1/7; flipped back to AF overnight 1/9 0300  aPTT 57 on heparin  1350 unit/hr. Tbili remains elevated and uptrending at 15.2, will continue to utilize aPTT levels.    Goal of Therapy:  Heparin  level 0.3-0.5 units/ml aPTT 66-85 sec Monitor platelets by anticoagulation protocol: Yes   Plan:  -Increase heparin  to 1400 units/hr -aPTT and CBC in am  Prentice Poisson, PharmD Clinical Pharmacist **Pharmacist phone directory can now be found on [email or web address] ([email or web address]).  Listed under [facility].

## 2023-12-09 NOTE — Progress Notes (Signed)
 PHARMACY - ANTICOAGULATION CONSULT NOTE  Pharmacy Consult for IV heparin  Indication: atrial fibrillation  No Known Allergies  Patient Measurements: Height: 5' 3 (160 cm) Weight: 78.2 kg (172 lb 6.4 oz) IBW/kg (Calculated) : 52.4 Heparin  Dosing Weight: ~ 70 kg  Vital Signs: Temp: 98.2 F (36.8 C) (01/09 0315) Temp Source: Oral (01/09 0315) BP: 130/73 (01/09 0830) Pulse Rate: 83 (01/09 0830)  Labs: Recent Labs    12/07/23 0354 12/07/23 1617 12/08/23 0339 12/08/23 1142 12/08/23 1613 12/08/23 2317 12/09/23 0322 12/09/23 0837  HGB 8.9*  --   --   --   --   --   --   --   HCT 26.2*  --   --   --   --   --   --   --   PLT 153  --   --   --   --   --   --   --   APTT 60*   < > 68* 53*  --  97*  --   --   HEPARINUNFRC 0.22*  --  0.26*  --   --   --   --  0.35  CREATININE 1.60*   < > 1.68*  --  1.63*  --  1.49*  --    < > = values in this interval not displayed.    Estimated Creatinine Clearance: 33.8 mL/min (A) (by C-G formula based on SCr of 1.49 mg/dL (H)).   Assessment: 73 yo female s/p scheduled bioprosthetic mitral valve replacement with tissue valve 12/30 now with afib, pharmacy asked to start anticoagulation with IV heparin . Spontaneously converted 1/7; flipped back to AF overnight 1/9 0300  aPTT returned to therapeutic at 71 from supratherapeutic level of 97 following rate decrease from 1500 u/h to 1350. No s/sx of bleeding or infusion issues and have not held therapy. Tbili remains elevated and uptrending at 15.2, will continue to utilize aPTT levels.    Goal of Therapy:  Heparin  level 0.3-0.5 units/ml aPTT 66-85 sec Monitor platelets by anticoagulation protocol: Yes   Plan:  Continue heparin  infusion at 1350 units/hr Follow up CBC ordered 1/9 AM Confirmatory aPTT level at 1600  CBC daily and PRN  Randall Call, PharmD PGY2 Critical Care Pharmacy Resident Phone: [phone]

## 2023-12-09 NOTE — Progress Notes (Signed)
 TF turned off for ABD Ultrasound. Per Radiology, patient has to be NPO for 6 hours.

## 2023-12-09 NOTE — Plan of Care (Signed)
  Problem: Coping: Goal: Level of anxiety will decrease Outcome: Progressing   Problem: Pain Management: Goal: General experience of comfort will improve Outcome: Progressing   Problem: Safety: Goal: Ability to remain free from injury will improve Outcome: Progressing   Problem: Skin Integrity: Goal: Risk for impaired skin integrity will decrease Outcome: Progressing

## 2023-12-09 NOTE — Progress Notes (Signed)
 [facility] Progress Note   Assessment/ Plan:   Vanessa [name] is a/an 73 y.o. female with a past medical history HTN, NICM, CHF, pulmonary hypertension, mitral and tricuspid valve disease  who present w/ surgery for valvular disease c/b shock, AHRF, and AKI    Oliguric AKI on CKD 3A: Mild baseline CKD likely multifactorial.  Now with AKI secondary to ATN and cardiorenal syndrome in the setting of shock. Started on CRRT on 1/2 for worsening pulmonary edema and volume overload. Dialysis catheter placed by CCM, appreciate help - continue CRRT - all 4k fluids - UF goal 50 ml/hr as tolerated.  She actually has been pulling high volumes even on this low rate of UF.  Perhaps to get of pressors we could try keep even for one day.  At this time no changes made.  Will reach out to CHF. CVP 7-8 per RN.  Please page nephrology for UF goal changes -Monitor Daily I/Os, Daily weight  -pharmacy is assisting with medication dosing for CRRT including meropenem  -Maintain MAP>65 for optimal renal perfusion.    Multifactorial shock: Vasoplegic and cardioplegia catheter surgery.   - Has been on pressors and inotropes.  Now off milrinone .  Continue management per primary team - pressor requirement decreasing overall   - additionally on antibiotics (meropenem  and vanc) and is s/p stress dose steroids    Acute exacerbation of heart failure with reduced ejection fraction: Volume overload.  - Optimize volume with CRRT as above    Severe MR and tricuspid valvular disease:  - Status post repair.  CT surgery managing   Postoperative cardiac tamponade:  - Continue monitoring per cardiology   Hyponatremia: In the setting of AKI, improved on CRRT.     Anemia: Related to blood loss and compounded by AKI and critical illness.   Transfusions per primary team.  Started aranesp  40 mcg weekly on Tuesdays  - CBC in AM  Hyperbilirubinemia - per primary team.  Increasing.   Disposition - in ICU on CRRT    Subjective:    Seen and examined on CRRT.  Procedure supervised.  She had 4.2 liters UF over 1/8 with CRRT as of this note.  UF goal has been 50 ml/hr.  No urine output over 1/8.  She feels ok today - managed to sleep a little.  She had a swallow study yesterday and was approved for liquids but hasn't had much of an appetite.  Everything mostly via tube feeds per RN.  She has been on levophed  at 2 mcg/min, epi at 3 mcg/min, vasopressin  at 0.04 units/min, and amio.     Review of systems:     Denies shortness of breath or air hunger Denies chest pain  Denies n/v Not sleeping well but better than before   Objective:   BP 99/66   Pulse 94   Temp 98.2 F (36.8 C) (Oral)   Resp (!) 33   Ht 5' 3 (1.6 m)   Wt 79.1 kg   SpO2 92%   BMI 30.89 kg/m   Intake/Output Summary (Last 24 hours) at 12/09/2023 0542 Last data filed at 12/09/2023 0500 Gross per 24 hour  Intake 3657.32 ml  Output 4959 ml  Net -1301.68 ml   Weight change:   Physical Exam:   General elderly female in no acute distress HEENT normocephalic atraumatic extraocular movements intact sclera anicteric Neck supple trachea midline Lungs clear but reduced on auscultation bilaterally normal work of breathing at rest on 1 liter oxygen Heart  S1S2 no rub Abdomen soft nontender nondistended Extremities no lower extremity edema appreciated Psych no anxiety or agitation Neuro - alert and interactive; conversant and follows commands Access left internal jugular non-tunneled dialysis catheter   Imaging: Port CXR Result Date: 12/07/2023 CLINICAL DATA:  Line placement EXAM: PORTABLE CHEST 1 VIEW COMPARISON:  X-ray 12/06/2023 and older. FINDINGS: Film is rotated to the left. Enlarged cardiopericardial silhouette with sternal wires. Vascular congestion. Persistent left retrocardiac opacity. No pneumothorax. Overlapping cardiac leads. Double-lumen left subclavian line with the tip overlying the SVC. Feeding tube extending beneath the  diaphragm. Tip not included in the imaging field. Right IJ Swan-Ganz catheter with the tip overlying the left pulmonary artery. IMPRESSION: Stable tubes and lines. Enlarged heart with vascular congestion. Persistent left retrocardiac opacity. Rotated radiograph Electronically Signed   By: Ranell Bring M.D.   On: 12/07/2023 17:50    Labs: BMET Recent Labs  Lab 12/06/23 0418 12/06/23 1544 12/07/23 0354 12/07/23 1617 12/08/23 0339 12/08/23 1613 12/08/23 1745 12/09/23 0322  NA 135 136 137 136 135 133*  --  134*  K 3.8 3.8 3.6 4.1 4.3 5.0 4.5 4.6  CL 99 101 102 101 102 98  --  100  CO2 22 22 23  21* 23 20*  --  24  GLUCOSE 172* 129* 131* 160* 150* 162*  --  168*  BUN 46* 46* 36* 39* 36* 38*  --  29*  CREATININE 1.66* 1.64* 1.60* 1.79* 1.68* 1.63*  --  1.49*  CALCIUM  8.6* 8.5* 8.6* 7.9* 8.1* 8.4*  --  8.5*  PHOS 3.2 2.3* 2.5 2.7 2.8 2.7  --  2.6   CBC Recent Labs  Lab 12/05/23 0349 12/05/23 0948 12/05/23 1301 12/06/23 0418 12/07/23 0354  WBC 16.3*  --  17.1* 15.4* 16.6*  HGB 9.6* 10.5* 9.4* 8.9* 8.9*  HCT 29.0* 31.0* 28.2* 26.9* 26.2*  MCV 87.6  --  87.0 87.1 86.8  PLT 97*  --  104* 124* 153    Medications:     arformoterol   15 mcg Nebulization BID   Chlorhexidine  Gluconate Cloth  6 each Topical Daily   darbepoetin (ARANESP ) injection - NON-DIALYSIS  40 mcg Subcutaneous Q Tue-1800   fiber supplement (BANATROL TF)  60 mL Per Tube TID   Gerhardt's butt cream   Topical BID   influenza vaccine adjuvanted  0.5 mL Intramuscular Tomorrow-1000   insulin  aspart  0-24 Units Subcutaneous Q4H   midodrine   15 mg Per Tube TID WC   multivitamin  1 tablet Per Tube QHS   mouth rinse  15 mL Mouth Rinse 4 times per day   pantoprazole  (PROTONIX ) IV  40 mg Intravenous Q24H   revefenacin   175 mcg Nebulization Daily   sodium chloride  flush  3 mL Intravenous Q12H   Katheryn JAYSON Saba, MD 12/09/2023, 5:54 AM

## 2023-12-09 NOTE — Progress Notes (Signed)
 NAME:  Vanessa [name], MRN:  [id number], DOB:  06-May-1951, LOS: 10 ADMISSION DATE:  11/29/2023, CONSULTATION DATE:  11/29/23 REFERRING MD:  Dr. Maryjane, CHIEF COMPLAINT:  postop   History of Present Illness:  72yoF with PMH significant for HTN, NICM, HFrEF (EF40-45%), PHTN, CKD and hx of prior severe MR s/p prior mitral clip x 2 in 2020.  Pt with progressive DOE found to have increasing MR  and moderate TR despite adjustments in GDMT.  Not felt to be candidate for additional mitral clip.  Repeat LHC without CAD but showed moderate PHTN.  Underwent mitral valve replacement with tissue valve and tricuspid valve repair by Dr. Maryjane on 12/30.  PCCM consulted to assist with vent and medical management post-operatively in ICU.   Pertinent  Medical History  Never smoker, MR w/ previous Mitral clip x 2 (2020), NICM, HFrEF, PHTN, HTN, CKD3b  Significant Hospital Events: Including procedures, antibiotic start and stop dates in addition to other pertinent events   MVR by Dr. Maryjane; relook for tamponade/washout later in evening without obvious source 1/2 intubated for respiratory decompensation, iNO started. Vanc, meropenem  started. CRRT started.  1/5 extubated 1/8 converted to NSR overnight 1/9 back to A.fib around 0200  Interim History / Subjective:  Back in A.Fib around 0200. Off NE. Currently on 3 epi, 0.04 Vaso.  Objective   Blood pressure 102/67, pulse 76, temperature 98.2 F (36.8 C), temperature source Oral, resp. rate 18, height 5' 3 (1.6 m), weight 78.2 kg, SpO2 95%. PAP: (51-59)/(26-34) 51/26 CVP:  [4 mmHg-13 mmHg] 8 mmHg CO:  [4.7 L/min] 4.7 L/min CI:  [2.39 L/min/m2-2.4 L/min/m2] 2.4 L/min/m2  FiO2 (%):  [24 %] 24 %   Intake/Output Summary (Last 24 hours) at 12/09/2023 0729 Last data filed at 12/09/2023 0600 Gross per 24 hour  Intake 3468.75 ml  Output 4783 ml  Net -1314.25 ml   Filed Weights   12/07/23 0500 12/08/23 0500 12/09/23 0500  Weight: 81.3 kg 79.1 kg 78.2 kg    Examination: Ill appearing woman sitting up in bed eating breakfast, in NAD [place]/AT, eyes mildly icteric, EOMI Slightly labored but able to converse normally. CTAB RRR, no M/R/G Abd soft, NT No peripheral edema, no cyanosis Awake, answering questions, tracking. Follows basic commands. Skin warm, dry   Resolved Hospital Problem list :   Postoperative vent management  Assessment & Plan:  Severe MR (s/p prior mitral clip x 2 in 2020) now s/p tissue MVR Moderate TR s/p TV repair  Moderate PHTN> progressed to severe postop; PA pressures significantly proved with volume removal today NICM, HFrEF, EF 40-45% Post bypass vasoplegia and cardioplegia  Postop tamponade resolved after relook and washout POD #0 Paroxysmal A-flutter, Afib. -post-op care per TCTS -run even on CRRT today to try wean pressors further (had been even to -50) -Continue to wean epi, vaso -Continue midodrine , increased dose 1/8 to 15mg  TID -Continue Heparin  gtt -Repeat limited echo at some point, timing per AHF  A.fib - converted to NSR 1/8 but unfortunately back to A.fib 1/9 early AM - Continue amio, not sure that rebolus will help much - Wean pressors as able - Keep K > 4, Mg > 2 - Might ultimately need DCCV at some point  Mixed cardiogenic and distributive shock -con't pressors for SBP >100 -complete course of antibiotics today 1/9- mero, vanc  Acute pulmonary edema - improved Acute respiratory failure with hypoxia requiring mechanical ventilation - s/p extubation 1/5 - Running CRRT net even today - Out of  bed mobility as tolerated (currently on CRRT), pulmonary hygiene - Wean supplemental oxygen as able to maintain SpO2 greater than 90%  Expected post-operative ABLA Post-op thrombocytopenia-improving - Transfuse for hemoglobin less than 7 or hemodynamically significant bleeding  AKI on CKD3b now query cardiorenal vs. ATN from initial hemodynamic insults. - Continue CRRT per nephrology. Running even  to -50 - Bladder scanning protocol with Foley discontinued - Renally dose meds and avoid nephrotoxic medications  Possible infection; no identified source - Complete 7-day course of vancomycin  and meropenem  on 1/9  Hyperbilirubinemia- downtrending, likely from right heart failure decompensation. Also on Amiodarone  which could be slightly affecting clearance -Monitor periodically -Avoid hepatotoxins  At risk for malnutrition - Continue tube feeding at goal - continue PO diet, tolerating but limited quantities  Constipation; resolved> now diarrhea -Banatrol for diarrhea, monitor potassium closely  Deconditioning -Needs PT, OT.  Anticipate discharge will need CIR - CRRT unfortunately limits mobility   Best Practice (right click and Reselect all SmartList Selections daily)   Diet/type: TF, regular as ordered DVT prophylaxis systemic heparin  Pressure ulcer(s): None GI prophylaxis: PPI Lines: Central line, Dialysis Catheter, and Arterial Line Foley:  Yes, and it is still needed Code Status:  full code Last date of multidisciplinary goals of care discussion [per primary]  CC time: 35 min.   Sammi Gore, PA - [facility] For pager details, please see AMION or use Epic chat  After 1900, please call [facility] for cross coverage needs 12/09/2023, 7:29 AM

## 2023-12-09 NOTE — Progress Notes (Addendum)
 Patient ID: Vanessa [name], female   DOB: June 16, 1951, 73 y.o.   MRN: [id number]      Advanced Heart Failure Rounding Note  Cardiologist: Dr. Rolan Chief Complaint: Cardiogenic shock  Subjective:   S/P MVR (tissue) and TV ring and same day return to OR for tamponade/bleeding. 1/2: Progressive volume overload with poor UOP, CVVH started but ended up re-intubated.   Extubated 12/05/23  - Back in a fib this morning around 2  - Levophed  @1mcg ; epinephrine  @3 , vasopressin  @0 .04 - CVP 8  Objective:   Weight Range: 78.2 kg Body mass index is 30.54 kg/m.   Vital Signs:   Temp:  [98.1 F (36.7 C)-98.8 F (37.1 C)] 98.2 F (36.8 C) (01/09 0315) Pulse Rate:  [71-105] 88 (01/09 1015) Resp:  [16-35] 17 (01/09 1015) BP: (78-146)/(54-115) 114/75 (01/09 1015) SpO2:  [87 %-100 %] 97 % (01/09 1015) Arterial Line BP: (79-116)/(41-61) 104/57 (01/08 1715) FiO2 (%):  [24 %] 24 % (01/09 0815) Weight:  [78.2 kg] 78.2 kg (01/09 0500) Last BM Date : 12/08/23  Weight change: Filed Weights   12/07/23 0500 12/08/23 0500 12/09/23 0500  Weight: 81.3 kg 79.1 kg 78.2 kg   Intake/Output:   Intake/Output Summary (Last 24 hours) at 12/09/2023 1023 Last data filed at 12/09/2023 1000 Gross per 24 hour  Intake 3466.23 ml  Output 4971 ml  Net -1504.77 ml    Physical Exam  CVP 8 General:  frail appearing.  No respiratory difficulty HEENT: +cortrak Neck: supple. JVD ~8 cm. Carotids 2+ bilat; no bruits. No lymphadenopathy or thyromegaly appreciated. LIJ trialysis Cor: PMI nondisplaced. Regular rate & irregular rhythm. No rubs, gallops or murmurs. Lungs: clear, diminished bases Abdomen: soft, nontender, nondistended. No hepatosplenomegaly. No bruits or masses. Good bowel sounds. Extremities: no cyanosis, clubbing, rash, trace BLE edema  Neuro: alert & oriented x 3, cranial nerves grossly intact. moves all 4 extremities w/o difficulty. Affect pleasant.  Telemetry   A fib 80s (Personally reviewed)     Labs    CBC Recent Labs    12/07/23 0354  WBC 16.6*  HGB 8.9*  HCT 26.2*  MCV 86.8  PLT 153   Basic Metabolic Panel Recent Labs    98/91/74 0338 12/08/23 0339 12/08/23 1613 12/08/23 1745 12/09/23 0322  NA  --    < > 133*  --  134*  K  --    < > 5.0 4.5 4.6  CL  --    < > 98  --  100  CO2  --    < > 20*  --  24  GLUCOSE  --    < > 162*  --  168*  BUN  --    < > 38*  --  29*  CREATININE  --    < > 1.63*  --  1.49*  CALCIUM   --    < > 8.4*  --  8.5*  MG 2.6*  --   --   --   --   PHOS  --    < > 2.7  --  2.6   < > = values in this interval not displayed.   Liver Function Tests Recent Labs    12/06/23 1103 12/06/23 1544 12/08/23 1613 12/09/23 0322  AST 108*  --   --  44*  ALT 39  --   --  37  ALKPHOS 74  --   --  94  BILITOT 13.2*  --   --  15.2*  PROT 6.4*  --   --  7.0  ALBUMIN  3.1*   < > 3.1* 3.0*  3.1*   < > = values in this interval not displayed.   No results for input(s): LIPASE, AMYLASE in the last 72 hours. Cardiac Enzymes No results for input(s): CKTOTAL, CKMB, CKMBINDEX, TROPONINI in the last 72 hours.  BNP: BNP (last 3 results) Recent Labs    05/26/23 1610 08/26/23 1103 09/22/23 0905  BNP 778.0* 781.0* 926.0*   Other results:  Imaging   No results found.     Medications:   Scheduled Medications:  arformoterol   15 mcg Nebulization BID   Chlorhexidine  Gluconate Cloth  6 each Topical Daily   darbepoetin (ARANESP ) injection - NON-DIALYSIS  40 mcg Subcutaneous Q Tue-1800   fiber supplement (BANATROL TF)  60 mL Per Tube TID   Gerhardt's butt cream   Topical BID   influenza vaccine adjuvanted  0.5 mL Intramuscular Tomorrow-1000   insulin  aspart  0-24 Units Subcutaneous Q4H   midodrine   15 mg Per Tube TID WC   multivitamin  1 tablet Per Tube QHS   mouth rinse  15 mL Mouth Rinse 4 times per day   pantoprazole  (PROTONIX ) IV  40 mg Intravenous Q24H   revefenacin   175 mcg Nebulization Daily   sodium chloride  flush  3 mL  Intravenous Q12H    Infusions:   prismasol  BGK 4/2.5 500 mL/hr at 12/09/23 1002    prismasol  BGK 4/2.5 300 mL/hr at 12/09/23 0033   sodium chloride  10 mL/hr at 12/07/23 1630   sodium chloride      amiodarone  30 mg/hr (12/09/23 1000)   epinephrine  2 mcg/min (12/09/23 1000)   feeding supplement (VITAL AF 1.2 CAL) Stopped (12/09/23 0836)   heparin  1,350 Units/hr (12/09/23 1000)   meropenem  (MERREM ) IV Stopped (12/09/23 0612)   norepinephrine  3 mcg/min (12/09/23 1000)   prismasol  BGK 4/2.5 1,000 mL/hr at 12/09/23 0540   vasopressin  0.04 Units/min (12/09/23 1000)    PRN Medications: sodium chloride , sodium chloride , artificial tears, bisacodyl  **OR** bisacodyl , docusate, heparin , HYDROmorphone  (DILAUDID ) injection, levalbuterol , ondansetron  (ZOFRAN ) IV, mouth rinse, oxyCODONE , pneumococcal 20-valent conjugate vaccine, sodium chloride , sodium chloride  flush  Patient Profile   Vanessa [name] is a 73 y.o. with history of nonischemic cardiomyopathy and severe MR s/p Mitral Clip x2 (2020), pHTN, and CKD IIIb.   Assessment/Plan  1. Post Cardiotomy Cardiogenic/vasoplegic Shock: -Bioprosthetic MVR and tricuspid ring 11/29/23 with Dr. Maryjane. Pre-op  cath with no significant CAD.  Pre-op  echo with EF 40-45%, severe MR. Went back to the OR with post-op tamponade for clean-out.  Echo 12/31 showed EF 25-30%, moderate RV dysfunction, s/p bioprosthetic mitral valve replacement with trivial MR and tricuspid valve repair with trivial TR - Worsened on 1/2 and re-intubated in setting of volume overload, oliguric renal failure &  pulmonary hypertension  - Extubated on 12/04/22 - NE at 1, Epi at 3, vaso @0 .04.  Milrinone  stopped 1/8.  - Continue midodrine  15 mg TID to hopefully be able to wean off more pressor support.   2. Mitral regurgitation:   - TEE in 11/19 with severe MR, restricted posterior leaflet.  She had Mitraclip x 2 in 1/20. Post-op echo showed mild MR and mild mitral stenosis.   - Echo in  9/24 showed MV s/p Mitraclip x 2 with mean gradient 6 mmHg and severe mitral regurgitation. TEE was then done to evaluate, showing 2 Mitraclips in place with mean gradient 4 mmHg and severe eccentric MR from between the 2 clips with ERO 0.54 cm^2.  She was not  thought to be a candidate for an additional Mitraclip due to lack of room between the two existing clips. R/LHC (11/24) showed moderate mixed pulmonary arterial and pulmonary venous hypertension likely due to mitral valve regurgitation.  - S/p MV replacement and Tricuspid repair on 11/29/23 with Dr. Maryjane - Repeat limited TTE later this week  3. Ventricular ectopy  - Previously had frequent PVCs - Continue amiodarone  gtt at 30mg /hr; no longer having ectopy.   4. Pulmonary hypertension - Most recent RHC (11/24) with PVR 4.5, with moderate mixed pulmonary arterial and pulmonary venous hypertension likely due to mitral valve regurgitation. Now s/p MVR, PA systolic pressure lower today in 60s.  Suspect still mixed PAH/PVH with elevated PCWP.  NO was tried at 10 ppm but did not have much effect so now off.   - Will hold off on pulmonary vasodilators; small clinical trials demonstrate no improvement and some markers of harm with PDE5i in Group II PH 2/2 mitral valve disease despite MVR.  - Stable off milrinone    5. Toxic multinodular goiter: She is now off methimazole . TSH was normal in 8/24. Follows with Endocrinology  6. AKI on CKD stage 3 - Volume management per CRRT - Plan to keep even for today per nephrology  7. Blood loss anemia - Hgb stable.   8. Complete heart block: She had post-op CHB.  Rate controlled a fib today  9. Hypoxic / Hypercapnic respiratory failure - stable on [place]  10. Atrial fibrillation: Patient previously in AF with controlled rate. Continue amio gtt.  - Was in NSR yesterday, back in a fib this morning - Continue heparin  gtt - Continue to wean off pressors to hopefully keep out of a fib  11. ID:  - WBC  17>15>16 - Plan to complete vanc/merrem  today (day 7)  12. Thrombocytopenia - Improving; plt ct 153  13. Deconditioning - Chronic deconditioning; will need extensive PT.   Vanessa [name] AGACNP-BC  12/09/2023 10:23 AM

## 2023-12-09 NOTE — Plan of Care (Signed)
  Problem: Education: Goal: Knowledge of General Education information will improve Description: Including pain rating scale, medication(s)/side effects and non-pharmacologic comfort measures Outcome: Progressing   Problem: Health Behavior/Discharge Planning: Goal: Ability to manage health-related needs will improve Outcome: Progressing   Problem: Clinical Measurements: Goal: Ability to maintain clinical measurements within normal limits will improve Outcome: Progressing Goal: Will remain free from infection Outcome: Progressing Goal: Diagnostic test results will improve Outcome: Progressing Goal: Respiratory complications will improve Outcome: Progressing Goal: Cardiovascular complication will be avoided Outcome: Progressing   Problem: Activity: Goal: Risk for activity intolerance will decrease Outcome: Progressing   Problem: Nutrition: Goal: Adequate nutrition will be maintained Outcome: Progressing   Problem: Coping: Goal: Level of anxiety will decrease Outcome: Not Progressing   Problem: Elimination: Goal: Will not experience complications related to bowel motility Outcome: Progressing Goal: Will not experience complications related to urinary retention Outcome: Not Applicable   Problem: Pain Management: Goal: General experience of comfort will improve Outcome: Progressing   Problem: Safety: Goal: Ability to remain free from injury will improve Outcome: Progressing   Problem: Skin Integrity: Goal: Risk for impaired skin integrity will decrease Outcome: Progressing   Problem: Education: Goal: Will demonstrate proper wound care and an understanding of methods to prevent future damage Outcome: Progressing Goal: Knowledge of disease or condition will improve Outcome: Progressing Goal: Knowledge of the prescribed therapeutic regimen will improve Outcome: Progressing Goal: Individualized Educational Video(s) Outcome: Progressing   Problem: Activity: Goal:  Risk for activity intolerance will decrease Outcome: Progressing   Problem: Cardiac: Goal: Will achieve and/or maintain hemodynamic stability Outcome: Progressing   Problem: Clinical Measurements: Goal: Postoperative complications will be avoided or minimized Outcome: Progressing   Problem: Respiratory: Goal: Respiratory status will improve Outcome: Progressing   Problem: Skin Integrity: Goal: Wound healing without signs and symptoms of infection Outcome: Progressing Goal: Risk for impaired skin integrity will decrease Outcome: Progressing   Problem: Urinary Elimination: Goal: Ability to achieve and maintain adequate renal perfusion and functioning will improve Outcome: Progressing   Problem: Education: Goal: Will demonstrate proper wound care and an understanding of methods to prevent future damage Outcome: Progressing Goal: Knowledge of disease or condition will improve Outcome: Progressing Goal: Knowledge of the prescribed therapeutic regimen will improve Outcome: Progressing Goal: Individualized Educational Video(s) Outcome: Progressing   Problem: Activity: Goal: Risk for activity intolerance will decrease Outcome: Progressing   Problem: Cardiac: Goal: Will achieve and/or maintain hemodynamic stability Outcome: Progressing   Problem: Clinical Measurements: Goal: Postoperative complications will be avoided or minimized Outcome: Progressing   Problem: Respiratory: Goal: Respiratory status will improve Outcome: Progressing   Problem: Skin Integrity: Goal: Wound healing without signs and symptoms of infection Outcome: Progressing Goal: Risk for impaired skin integrity will decrease Outcome: Progressing   Problem: Urinary Elimination: Goal: Ability to achieve and maintain adequate renal perfusion and functioning will improve Outcome: Progressing

## 2023-12-10 ENCOUNTER — Inpatient Hospital Stay (HOSPITAL_COMMUNITY): Payer: PPO

## 2023-12-10 ENCOUNTER — Other Ambulatory Visit: Payer: Self-pay

## 2023-12-10 DIAGNOSIS — Z952 Presence of prosthetic heart valve: Secondary | ICD-10-CM | POA: Diagnosis not present

## 2023-12-10 DIAGNOSIS — I340 Nonrheumatic mitral (valve) insufficiency: Secondary | ICD-10-CM | POA: Diagnosis not present

## 2023-12-10 DIAGNOSIS — R570 Cardiogenic shock: Secondary | ICD-10-CM | POA: Diagnosis not present

## 2023-12-10 DIAGNOSIS — I420 Dilated cardiomyopathy: Secondary | ICD-10-CM | POA: Diagnosis not present

## 2023-12-10 DIAGNOSIS — I272 Pulmonary hypertension, unspecified: Secondary | ICD-10-CM | POA: Diagnosis not present

## 2023-12-10 LAB — RENAL FUNCTION PANEL
Albumin: 2.9 g/dL — ABNORMAL LOW (ref 3.5–5.0)
Albumin: 2.9 g/dL — ABNORMAL LOW (ref 3.5–5.0)
Anion gap: 10 (ref 5–15)
Anion gap: 10 (ref 5–15)
BUN: 25 mg/dL — ABNORMAL HIGH (ref 8–23)
BUN: 19 mg/dL (ref 8–23)
CO2: 26 mmol/L (ref 22–32)
CO2: 25 mmol/L (ref 22–32)
Calcium: 8.6 mg/dL — ABNORMAL LOW (ref 8.9–10.3)
Calcium: 8.5 mg/dL — ABNORMAL LOW (ref 8.9–10.3)
Chloride: 100 mmol/L (ref 98–111)
Chloride: 100 mmol/L (ref 98–111)
Creatinine, Ser: 1.39 mg/dL — ABNORMAL HIGH (ref 0.44–1.00)
Creatinine, Ser: 1.19 mg/dL — ABNORMAL HIGH (ref 0.44–1.00)
GFR, Estimated: 40 mL/min — ABNORMAL LOW (ref >60)
GFR, Estimated: 49 mL/min — ABNORMAL LOW (ref >60)
Glucose, Bld: 145 mg/dL — ABNORMAL HIGH (ref 70–99)
Glucose, Bld: 142 mg/dL — ABNORMAL HIGH (ref 70–99)
Phosphorus: 2.5 mg/dL (ref 2.5–4.6)
Phosphorus: 2.5 mg/dL (ref 2.5–4.6)
Potassium: 4.3 mmol/L (ref 3.5–5.1)
Potassium: 4.3 mmol/L (ref 3.5–5.1)
Sodium: 136 mmol/L (ref 135–145)
Sodium: 135 mmol/L (ref 135–145)

## 2023-12-10 LAB — GLUCOSE, CAPILLARY
Glucose-Capillary: 115 mg/dL — ABNORMAL HIGH (ref 70–99)
Glucose-Capillary: 125 mg/dL — ABNORMAL HIGH (ref 70–99)
Glucose-Capillary: 125 mg/dL — ABNORMAL HIGH (ref 70–99)
Glucose-Capillary: 126 mg/dL — ABNORMAL HIGH (ref 70–99)
Glucose-Capillary: 143 mg/dL — ABNORMAL HIGH (ref 70–99)
Glucose-Capillary: 144 mg/dL — ABNORMAL HIGH (ref 70–99)

## 2023-12-10 LAB — HEPATIC FUNCTION PANEL
ALT: 34 U/L (ref 0–44)
AST: 45 U/L — ABNORMAL HIGH (ref 15–41)
Albumin: 2.9 g/dL — ABNORMAL LOW (ref 3.5–5.0)
Alkaline Phosphatase: 83 U/L (ref 38–126)
Bilirubin, Direct: 8.3 mg/dL — ABNORMAL HIGH (ref 0.0–0.2)
Indirect Bilirubin: 5.2 mg/dL — ABNORMAL HIGH (ref 0.3–0.9)
Total Bilirubin: 13.5 mg/dL — ABNORMAL HIGH (ref 0.0–1.2)
Total Protein: 6.9 g/dL (ref 6.5–8.1)

## 2023-12-10 LAB — CBC
HCT: 29.7 % — ABNORMAL LOW (ref 36.0–46.0)
Hemoglobin: 9.6 g/dL — ABNORMAL LOW (ref 12.0–15.0)
MCH: 29.6 pg (ref 26.0–34.0)
MCHC: 32.3 g/dL (ref 30.0–36.0)
MCV: 91.7 fL (ref 80.0–100.0)
Platelets: 191 10*3/uL (ref 150–400)
RBC: 3.24 MIL/uL — ABNORMAL LOW (ref 3.87–5.11)
RDW: 20.8 % — ABNORMAL HIGH (ref 11.5–15.5)
WBC: 20.3 10*3/uL — ABNORMAL HIGH (ref 4.0–10.5)
nRBC: 1.6 % — ABNORMAL HIGH (ref 0.0–0.2)

## 2023-12-10 LAB — PROCALCITONIN: Procalcitonin: 1.33 ng/mL

## 2023-12-10 LAB — ECHOCARDIOGRAM LIMITED
Height: 63 in
S' Lateral: 4.3 cm
Weight: 2719.59 [oz_av]

## 2023-12-10 LAB — COOXEMETRY PANEL
Carboxyhemoglobin: 2.7 % — ABNORMAL HIGH (ref 0.5–1.5)
Methemoglobin: <0.7 % (ref 0.0–1.5)
O2 Saturation: 59.8 %
Total hemoglobin: 10 g/dL — ABNORMAL LOW (ref 12.0–16.0)

## 2023-12-10 LAB — APTT: aPTT: 85 s — ABNORMAL HIGH (ref 24–36)

## 2023-12-10 LAB — HEPARIN LEVEL (UNFRACTIONATED): Heparin Unfractionated: 0.32 [IU]/mL (ref 0.30–0.70)

## 2023-12-10 MED ORDER — PERFLUTREN LIPID MICROSPHERE
1.0000 mL | INTRAVENOUS | Status: AC | PRN
  Administered 2023-12-10: 8 mL via INTRAVENOUS

## 2023-12-10 MED ORDER — TRAZODONE HCL 50 MG PO TABS
100.0000 mg | ORAL_TABLET | Freq: Every evening | ORAL | Status: DC | PRN
  Administered 2023-12-10 – 2023-12-16 (×8): 100 mg
  Filled 2023-12-10 (×8): qty 2

## 2023-12-10 MED ORDER — PRISMASOL BGK 4/2.5 32-4-2.5 MEQ/L EC SOLN: Status: DC

## 2023-12-10 NOTE — Progress Notes (Signed)
 NAME:  Vanessa [name], MRN:  [id number], DOB:  Jan 06, 1951, LOS: 11 ADMISSION DATE:  11/29/2023, CONSULTATION DATE:  11/29/23 REFERRING MD:  Dr. Maryjane, CHIEF COMPLAINT:  postop   History of Present Illness:  72yoF with PMH significant for HTN, NICM, HFrEF (EF40-45%), PHTN, CKD and hx of prior severe MR s/p prior mitral clip x 2 in 2020.  Pt with progressive DOE found to have increasing MR  and moderate TR despite adjustments in GDMT.  Not felt to be candidate for additional mitral clip.  Repeat LHC without CAD but showed moderate PHTN.  Underwent mitral valve replacement with tissue valve and tricuspid valve repair by Dr. Maryjane on 12/30.  PCCM consulted to assist with vent and medical management post-operatively in ICU.   Pertinent  Medical History  Never smoker, MR w/ previous Mitral clip x 2 (2020), NICM, HFrEF, PHTN, HTN, CKD3b  Significant Hospital Events: Including procedures, antibiotic start and stop dates in addition to other pertinent events   MVR by Dr. Maryjane; relook for tamponade/washout later in evening without obvious source 1/2 intubated for respiratory decompensation, iNO started. Vanc, meropenem  started. CRRT started.  1/5 extubated 1/8 converted to NSR overnight 1/9 back to A.fib around 0200  Interim History / Subjective:  Back to NSR last night. WBC up to 20. On NE 4 and vaso 0.04. Epi off. Coox 60.  Objective   Blood pressure (!) 107/59, pulse 72, temperature 97.9 F (36.6 C), temperature source Oral, resp. rate 15, height 5' 3 (1.6 m), weight 77.1 kg, SpO2 92%. CVP:  [0 mmHg-26 mmHg] 7 mmHg  FiO2 (%):  [24 %] 24 %   Intake/Output Summary (Last 24 hours) at 12/10/2023 0709 Last data filed at 12/10/2023 0600 Gross per 24 hour  Intake 3051.63 ml  Output 3263 ml  Net -211.37 ml   Filed Weights   12/08/23 0500 12/09/23 0500 12/10/23 0448  Weight: 79.1 kg 78.2 kg 77.1 kg   Examination:  Ill appearing woman sitting up in bed eating breakfast, watching TV,  in NAD [place]/AT, eyes mildly icteric, EOMI Normal effort. CTAB RRR, no M/R/G Abd soft, NT No peripheral edema, no cyanosis Awake, answering questions, tracking. Follows basic commands. Skin warm, dry   Resolved Hospital Problem list :   Postoperative vent management  Assessment & Plan:  Severe MR (s/p prior mitral clip x 2 in 2020) now s/p tissue MVR Moderate TR s/p TV repair  Moderate PHTN> progressed to severe postop; PA pressures significantly proved with volume removal today NICM, HFrEF, EF 40-45% Post bypass vasoplegia and cardioplegia  Postop tamponade resolved after relook and washout POD #11 Paroxysmal A-flutter, Afib. -post-op care per TCTS -run even on CRRT to try wean pressors further (had been even to -50) -Continue to wean NE, vaso. Now off Epi -Continue midodrine , increased dose 1/8 to 15mg  TID -Continue Heparin  gtt -Repeat limited echo  A.fib - converted to NSR 1/8 then back to A.fib 1/9 early AM. Subsequently rebolused with amio and dose increased to 60 followed by conversion back to NSR later that evening 1/9. - Continue amio at increased dose - Wean pressors as able - Keep K > 4, Mg > 2 - If recurs, might ultimately need DCCV at some point  Mixed cardiogenic and distributive shock. She is s/p course of antibiotics completed 1/9 (mero, vanc). Leukocytosis unfortunately worsened 1/10. -con't pressors for SBP >100 - Add PCT today - Can't get PICC given ongoing CRRT needs (discussed with nephro 1/10)  Acute pulmonary edema -  improved Acute respiratory failure with hypoxia requiring mechanical ventilation - s/p extubation 1/5 - Running CRRT net even  - Out of bed mobility as tolerated (currently on CRRT), pulmonary hygiene - Wean supplemental oxygen as able to maintain SpO2 greater than 90%  Expected post-operative ABLA Post-op thrombocytopenia-improving - Transfuse for hemoglobin less than 7 or hemodynamically significant bleeding  AKI on CKD3b now query  cardiorenal vs. ATN from initial hemodynamic insults. - Continue CRRT per nephrology. Running even to -50 - Bladder scanning protocol with Foley discontinued - Renally dose meds and avoid nephrotoxic medications  Hyperbilirubinemia- likely from right heart failure decompensation. Also on Amiodarone  which could be slightly affecting clearance. Abd US  negative. -Monitor periodically -Avoid hepatotoxins - Consider GI input if bili climbs again  At risk for malnutrition - Continue tube feeding at goal - continue PO diet, tolerating but limited quantities  Constipation; resolved> now diarrhea -Banatrol for diarrhea, monitor potassium closely  Deconditioning -Needs PT, OT.  Anticipate discharge will need CIR - CRRT unfortunately limits mobility  Insomnia. - Continue Trazodone   Best Practice (right click and Reselect all SmartList Selections daily)   Diet/type: TF, regular as ordered DVT prophylaxis systemic heparin  Pressure ulcer(s): None GI prophylaxis: PPI Lines: Central line, Dialysis Catheter, and Arterial Line Foley:  Yes, and it is still needed Code Status:  full code Last date of multidisciplinary goals of care discussion [per primary]  CC time: 35 min.   Sammi Gore, PA - [facility] For pager details, please see AMION or use Epic chat  After 1900, please call [facility] for cross coverage needs 12/10/2023, 7:09 AM

## 2023-12-10 NOTE — Progress Notes (Signed)
 Per Vallery Sa MD: wouldn't place PICC via secure chat. Provider aware.

## 2023-12-10 NOTE — Progress Notes (Signed)
 Echocardiogram 2D Echocardiogram has been performed.  Vanessa [name] 12/10/2023, 11:13 AM

## 2023-12-10 NOTE — Progress Notes (Signed)
 Patient ID: Vanessa [name], female   DOB: [birth year], 73 y.o.   MRN: [id number]  TCTS Evening Rounds:  Hemodynamics stable. NE weaned off today. Still on vaso 0.04.  Sats 95% on RA  Remains on CRRT.  Limited echo today showed EF 40-45% severe RV systolic dysfunction. MV gradient not measured. Trivial MR, No TR.

## 2023-12-10 NOTE — Progress Notes (Signed)
 [name] Progress Note   Assessment/ Plan:   Vanessa [name] is a/an 73 y.o. female with a past medical history HTN, NICM, CHF, pulmonary hypertension, mitral and tricuspid valve disease  who present w/ surgery for valvular disease c/b shock, AHRF, and AKI    Oliguric AKI on CKD 3A: Mild baseline CKD likely multifactorial.  Now with AKI secondary to ATN and cardiorenal syndrome in the setting of shock. Started on CRRT on 1/2 for worsening pulmonary edema and volume overload. Dialysis catheter placed by CCM, appreciate help - continue CRRT - switch back to 4K fluids   - UF goal keep even as tolerated.  CVP is 6-7.  Please page nephrology for UF goal changes -Monitor Daily I/Os, Daily weight  -pharmacy is assisting with medication dosing for CRRT including meropenem  -Maintain MAP>65 for optimal renal perfusion.    Multifactorial shock: Vasoplegic and cardioplegia catheter surgery.   - Has been on pressors and inotropes.  Now off milrinone .  Continue management per primary team - pressor requirement decreasing overall    - note she was also started on midodrine   - additionally on antibiotics (s/p meropenem  and s/p vanc) and is s/p stress dose steroids    Acute exacerbation of heart failure with reduced ejection fraction: Volume overload.  - Optimize volume with CRRT as above    Severe MR and tricuspid valvular disease:  - Status post repair.  CT surgery managing   Postoperative cardiac tamponade:  - Continue monitoring per cardiology   Hyponatremia: In the setting of AKI, improved on CRRT.     Anemia: Related to blood loss and compounded by AKI and critical illness.    - Transfusions per primary team.   - Started aranesp  40 mcg weekly on Tuesdays   Hyperbilirubinemia - per primary team.  Has plateaued but elevated.   Disposition - in ICU on CRRT   Subjective:    Seen and examined on CRRT.  Procedure supervised.  She had 3.1 liters UF over 1/ with CRRT as of  this note.  UF goal was net negative 50 ml/hr most of the day and I switched to keep even around 7pm.  No urine output over 1/9.  She was switched to 2K dialysate on 1/9 PM.  She has been on levophed  at 4 mcg/min, off of epi, and on vasopressin  at 0.04 units/min.  States that she feels ok.  On room air.   Review of systems:     Denies shortness of breath or air hunger Denies chest pain  Denies n/v; had some liquids yesterday  Not sleeping well but a little better than before   Objective:   BP 98/62   Pulse 69   Temp 97.9 F (36.6 C) (Oral)   Resp 20   Ht 5' 3 (1.6 m)   Wt 77.1 kg   SpO2 100%   BMI 30.11 kg/m   Intake/Output Summary (Last 24 hours) at 12/10/2023 0543 Last data filed at 12/10/2023 0500 Gross per 24 hour  Intake 3280.35 ml  Output 3500 ml  Net -219.65 ml   Weight change: -1.1 kg  Physical Exam:   General elderly female in no acute distress HEENT normocephalic atraumatic extraocular movements intact sclera anicteric Neck supple trachea midline Lungs clear but reduced on auscultation bilaterally normal work of breathing at rest on room air.  CVP 7 Heart S1S2 no rub Abdomen soft nontender nondistended Extremities no lower extremity edema appreciated Psych no anxiety or agitation Neuro - alert and  interactive; conversant and follows commands Access left internal jugular non-tunneled dialysis catheter   Imaging: US  Abdomen Limited RUQ (LIVER/GB) Result Date: 12/09/2023 CLINICAL DATA:  Hyperbilirubinemia. EXAM: ULTRASOUND ABDOMEN LIMITED RIGHT UPPER QUADRANT COMPARISON:  None Available. FINDINGS: Gallbladder: Dilated gallbladder. No shadowing stones, wall thickening or adjacent fluid. Common bile duct: Diameter: 3 mm Liver: No focal lesion identified. Within normal limits in parenchymal echogenicity. Portal vein is patent on color Doppler imaging with normal direction of blood flow towards the liver. Other: None. IMPRESSION: Dilated gallbladder but no wall  thickening, fluid or stones. No biliary ductal dilatation. Electronically Signed   By: Ranell Bring M.D.   On: 12/09/2023 15:50    Labs: BMET Recent Labs  Lab 12/07/23 0354 12/07/23 1617 12/08/23 0339 12/08/23 1613 12/08/23 1745 12/09/23 0322 12/09/23 1613 12/10/23 0339  NA 137 136 135 133*  --  134* 135 136  K 3.6 4.1 4.3 5.0 4.5 4.6 5.0 4.3  CL 102 101 102 98  --  100 100 100  CO2 23 21* 23 20*  --  24 21* 26  GLUCOSE 131* 160* 150* 162*  --  168* 125* 145*  BUN 36* 39* 36* 38*  --  29* 30* 25*  CREATININE 1.60* 1.79* 1.68* 1.63*  --  1.49* 1.53* 1.39*  CALCIUM  8.6* 7.9* 8.1* 8.4*  --  8.5* 8.4* 8.6*  PHOS 2.5 2.7 2.8 2.7  --  2.6 3.2 2.5   CBC Recent Labs  Lab 12/06/23 0418 12/07/23 0354 12/09/23 1001 12/10/23 0339  WBC 15.4* 16.6* 19.7* 20.3*  NEUTROABS  --   --  16.7*  --   HGB 8.9* 8.9* 9.7* 9.6*  HCT 26.9* 26.2* 29.7* 29.7*  MCV 87.1 86.8 89.5 91.7  PLT 124* 153 159 191    Medications:     arformoterol   15 mcg Nebulization BID   Chlorhexidine  Gluconate Cloth  6 each Topical Daily   darbepoetin (ARANESP ) injection - NON-DIALYSIS  40 mcg Subcutaneous Q Tue-1800   feeding supplement  237 mL Oral BID BM   fiber supplement (BANATROL TF)  60 mL Per Tube TID   Gerhardt's butt cream   Topical BID   influenza vaccine adjuvanted  0.5 mL Intramuscular Tomorrow-1000   insulin  aspart  0-24 Units Subcutaneous Q4H   midodrine   15 mg Per Tube TID WC   multivitamin  1 tablet Per Tube QHS   mouth rinse  15 mL Mouth Rinse 4 times per day   pantoprazole  (PROTONIX ) IV  40 mg Intravenous Q24H   revefenacin   175 mcg Nebulization Daily   sodium chloride  flush  3 mL Intravenous Q12H   Vanessa JAYSON Saba, MD 12/10/2023, 6:00 AM

## 2023-12-10 NOTE — Progress Notes (Signed)
 TCTS DAILY ICU PROGRESS NOTE                   [address]            [place] [id number]          [phone]   11 Days Post-Op Procedure(s) (LRB): EXPLORATION POST OPERATIVE OPEN HEART (N/A)  Total Length of Stay:  LOS: 11 days   Subjective: Slowly feeling better  Objective: Vital signs in last 24 hours: Temp:  [97.8 F (36.6 C)-98.4 F (36.9 C)] 97.9 F (36.6 C) (01/10 0400) Pulse Rate:  [68-107] 78 (01/10 0700) Cardiac Rhythm: Normal sinus rhythm (01/09 2000) Resp:  [15-33] 24 (01/10 0700) BP: (61-146)/(49-115) 97/85 (01/10 0700) SpO2:  [89 %-100 %] 100 % (01/10 0700) FiO2 (%):  [24 %] 24 % (01/09 0815) Weight:  [77.1 kg] 77.1 kg (01/10 0448)  Filed Weights   12/08/23 0500 12/09/23 0500 12/10/23 0448  Weight: 79.1 kg 78.2 kg 77.1 kg    Weight change: -1.1 kg   Hemodynamic parameters for last 24 hours: CVP:  [0 mmHg-26 mmHg] 5 mmHg  Intake/Output from previous day: 01/09 0701 - 01/10 0700 In: 3179.7 [I.V.:1694.4; NG/GT:1285.3; IV Piggyback:200] Out: 3263   Intake/Output this shift: No intake/output data recorded.  Current Meds: Scheduled Meds:  arformoterol   15 mcg Nebulization BID   Chlorhexidine  Gluconate Cloth  6 each Topical Daily   darbepoetin (ARANESP ) injection - NON-DIALYSIS  40 mcg Subcutaneous Q Tue-1800   feeding supplement  237 mL Oral BID BM   fiber supplement (BANATROL TF)  60 mL Per Tube TID   Gerhardt's butt cream   Topical BID   influenza vaccine adjuvanted  0.5 mL Intramuscular Tomorrow-1000   insulin  aspart  0-24 Units Subcutaneous Q4H   midodrine   15 mg Per Tube TID WC   multivitamin  1 tablet Per Tube QHS   mouth rinse  15 mL Mouth Rinse 4 times per day   pantoprazole  (PROTONIX ) IV  40 mg Intravenous Q24H   revefenacin   175 mcg Nebulization Daily   sodium chloride  flush  3 mL Intravenous Q12H   Continuous Infusions:   prismasol  BGK 4/2.5 500 mL/hr at 12/09/23 1800    prismasol  BGK 4/2.5 300 mL/hr at 12/09/23  1403   sodium chloride  10 mL/hr at 12/07/23 1630   sodium chloride  10 mL/hr at 12/10/23 0700   sodium chloride      amiodarone  60 mg/hr (12/10/23 0700)   feeding supplement (VITAL AF 1.2 CAL) 55 mL/hr at 12/10/23 0700   heparin  1,400 Units/hr (12/10/23 0700)   norepinephrine  4 mcg/min (12/10/23 0700)   prismasol  BGK 4/2.5     vasopressin  0.04 Units/min (12/10/23 0700)   PRN Meds:.sodium chloride , sodium chloride , sodium chloride , artificial tears, bisacodyl  **OR** bisacodyl , docusate, heparin , HYDROmorphone  (DILAUDID ) injection, levalbuterol , ondansetron  (ZOFRAN ) IV, mouth rinse, oxyCODONE , pneumococcal 20-valent conjugate vaccine, sodium chloride , sodium chloride  flush, traZODone   General appearance: alert, cooperative, and no distress Heart: regular rate and rhythm, no rub, and no murmur Lungs: clear to auscultation bilaterally Abdomen: benign Extremities: min edema Wound: incis healing well  Lab Results: CBC: Recent Labs    12/09/23 1001 12/10/23 0339  WBC 19.7* 20.3*  HGB 9.7* 9.6*  HCT 29.7* 29.7*  PLT 159 191   BMET:  Recent Labs    12/09/23 1613 12/10/23 0339  NA 135 136  K 5.0 4.3  CL 100 100  CO2 21* 26  GLUCOSE 125* 145*  BUN 30* 25*  CREATININE  1.53* 1.39*  CALCIUM  8.4* 8.6*    CMET: Lab Results  Component Value Date   WBC 20.3 (H) 12/10/2023   HGB 9.6 (L) 12/10/2023   HCT 29.7 (L) 12/10/2023   PLT 191 12/10/2023   GLUCOSE 145 (H) 12/10/2023   CHOL 178 08/11/2019   TRIG 90 11/30/2023   HDL 57 08/11/2019   LDLCALC 109 (H) 08/11/2019   ALT 34 12/10/2023   AST 45 (H) 12/10/2023   NA 136 12/10/2023   K 4.3 12/10/2023   CL 100 12/10/2023   CREATININE 1.39 (H) 12/10/2023   BUN 25 (H) 12/10/2023   CO2 26 12/10/2023   TSH 0.560 05/26/2023   INR 1.6 (H) 11/29/2023   HGBA1C 6.0 (H) 11/25/2023   MICROALBUR 3.1 (H) 08/11/2019      PT/INR: No results for input(s): LABPROT, INR in the last 72 hours. Radiology: US  EKG SITE RITE Result Date:  12/10/2023 If Site Rite image not attached, placement could not be confirmed due to current cardiac rhythm.  US  Abdomen Limited RUQ (LIVER/GB) Result Date: 12/09/2023 CLINICAL DATA:  Hyperbilirubinemia. EXAM: ULTRASOUND ABDOMEN LIMITED RIGHT UPPER QUADRANT COMPARISON:  None Available. FINDINGS: Gallbladder: Dilated gallbladder. No shadowing stones, wall thickening or adjacent fluid. Common bile duct: Diameter: 3 mm Liver: No focal lesion identified. Within normal limits in parenchymal echogenicity. Portal vein is patent on color Doppler imaging with normal direction of blood flow towards the liver. Other: None. IMPRESSION: Dilated gallbladder but no wall thickening, fluid or stones. No biliary ductal dilatation. Electronically Signed   By: Ranell Bring M.D.   On: 12/09/2023 15:50     Assessment/Plan: S/P Procedure(s) (LRB): EXPLORATION POST OPERATIVE OPEN HEART (N/A) POD#11  1 afeb, leukocytosis is trending higher, central line to be removed and PICC placed- completed Merrem  course 2 on levo and vaso- conts to wean slowly , on amio gtt, heparin  gtt  3 remains on CRRT - per nephro 4 currently in sinus rhythm 5 GB US  - dilated  but otherwise unremarkabl3, bilirubins, elevated but trending , AST mildly elevatedlower  6 getting limited echo today 7 conts TF  full dose- advancing from liquid diet today 8 CBG well controlled 9 H/H stable  10 CXR- stable appearance 11 conts therapies/ pulm hygiene, nebs  Vanessa [name] Cera PA-C Pager [phone] 12/10/2023 7:13 AM

## 2023-12-10 NOTE — Progress Notes (Signed)
 eLink Physician-Brief Progress Note Patient Name: Vanessa [name] DOB: 1951/04/05 MRN: [id number]   Date of Service  12/10/2023  HPI/Events of Note  insomnia  eICU Interventions  Add trazodone  PRN     Intervention Category Minor Interventions: Routine modifications to care plan (e.g. PRN medications for pain, fever)  Vanessa [name] 12/10/2023, 1:24 AM

## 2023-12-10 NOTE — Progress Notes (Signed)
 Physical Therapy Treatment Patient Details Name: Vanessa [name] MRN: [id number] DOB: 1951/10/07 Today's Date: 12/10/2023   History of Present Illness 73 yo F admitted 12/30 with DOE, increasing MR and TR. LCH without CAD. 12/30 MVR and tricuspid valve repair. Intubated 1/2-1/5. 1/2 CRRT started. PMhx: HTN, NICM, HFrEF (EF40-45%), Pulmonary HTN, CKD and  severe MR s/p prior mitral clip x 2 in 2020.    PT Comments  Pt pleasant but limited by fatigue and incontinent BM. Pt educated for all sternal precautions, transfers and mobility progression. Pt able to stand and pivot to chair with RW but unable to pick feet up for static marching in standing. Pt with assist for balance and posture in standing as well. Plan remains appropriate and will continue to follow.   HR 75-89 SPO2 92-98% on RA BP 108/61 (76) supine 109/94 (80) in chair    If plan is discharge home, recommend the following: A lot of help with walking and/or transfers;A lot of help with bathing/dressing/bathroom;Assistance with cooking/housework   Can travel by private vehicle        Equipment Recommendations  Rolling walker (2 wheels);BSC/3in1    Recommendations for Other Services       Precautions / Restrictions Precautions Precautions: Sternal;Fall;Other (comment) Precaution Comments: CRRT, cortrak, watch vitals, incontinent stool Restrictions Weight Bearing Restrictions Per Provider Order: No (sternal precautions) RUE Weight Bearing Per Provider Order: Partial weight bearing LUE Weight Bearing Per Provider Order: Partial weight bearing     Mobility  Bed Mobility Overal bed mobility: Needs Assistance Bed Mobility: Rolling, Sidelying to Sit Rolling: Mod assist Sidelying to sit: Mod assist       General bed mobility comments: cues for sequence with assist to roll bil for pericare, mod assist to clear legs and elevate trunk into sitting    Transfers Overall transfer level: Needs assistance   Transfers: Sit  to/from Stand, Bed to chair/wheelchair/BSC Sit to Stand: Min assist, Mod assist, +2 safety/equipment           General transfer comment: initial mod +2 to rise from bed with repeated 4 trials from chair min +2 assist, cues for hand placement with left knee blocked and assist for anterior translation and rise from surface. stand pivot with RW from bed to chair    Ambulation/Gait                   Stairs             Wheelchair Mobility     Tilt Bed    Modified Rankin (Stroke Patients Only)       Balance Overall balance assessment: Needs assistance Sitting-balance support: No upper extremity supported, Feet supported Sitting balance-Leahy Scale: Fair     Standing balance support: Bilateral upper extremity supported, Reliant on assistive device for balance Standing balance-Leahy Scale: Poor Standing balance comment: Rw in standing                            Cognition Arousal: Alert Behavior During Therapy: WFL for tasks assessed/performed, Flat affect Overall Cognitive Status: Impaired/Different from baseline Area of Impairment: Memory, Problem solving                     Memory: Decreased recall of precautions       Problem Solving: Slow processing          Exercises      General Comments  Pertinent Vitals/Pain Pain Assessment Faces Pain Scale: Hurts a little bit Pain Location: legs, chest Pain Descriptors / Indicators: Sore Pain Intervention(s): Limited activity within patient's tolerance, Monitored during session, Repositioned    Home Living                          Prior Function            PT Goals (current goals can now be found in the care plan section) Progress towards PT goals: Progressing toward goals    Frequency    Min 1X/week      PT Plan      Co-evaluation              AM-PAC PT 6 Clicks Mobility   Outcome Measure  Help needed turning from your back to your  side while in a flat bed without using bedrails?: A Lot Help needed moving from lying on your back to sitting on the side of a flat bed without using bedrails?: A Lot Help needed moving to and from a bed to a chair (including a wheelchair)?: A Lot Help needed standing up from a chair using your arms (e.g., wheelchair or bedside chair)?: A Lot Help needed to walk in hospital room?: Total Help needed climbing 3-5 steps with a railing? : Total 6 Click Score: 10    End of Session   Activity Tolerance: Patient tolerated treatment well Patient left: in chair;with call bell/phone within reach;with chair alarm set Nurse Communication: Mobility status PT Visit Diagnosis: Other abnormalities of gait and mobility (R26.89);Muscle weakness (generalized) (M62.81);Difficulty in walking, not elsewhere classified (R26.2)     Time: 9199-9164 PT Time Calculation (min) (ACUTE ONLY): 35 min  Charges:    $Therapeutic Activity: 23-37 mins PT General Charges $$ ACUTE PT VISIT: 1 Visit                     Lenoard SQUIBB, PT Acute Rehabilitation Services Office: [phone]    Ehtan Delfavero B Chynah Orihuela 12/10/2023, 10:14 AM

## 2023-12-10 NOTE — Progress Notes (Signed)
 Lab will at APTT to blue top sent down for AM labs.

## 2023-12-10 NOTE — Progress Notes (Signed)
 Per Dr. Leafy Ro and Dr. Gasper Lloyd, titrate to keep SBP >95. Orders modified to reflect.

## 2023-12-10 NOTE — Progress Notes (Signed)
 Patient ID: Vanessa [name], female   DOB: Mar 16, 1951, 73 y.o.   MRN: [id number]      Advanced Heart Failure Rounding Note  Cardiologist: Dr. Rolan  Chief Complaint: Cardiogenic shock  Subjective:   S/P MVR (tissue) and TV ring and same day return to OR for tamponade/bleeding. 1/2: Progressive volume overload with poor UOP, CVVH started but ended up re-intubated.   Extubated 12/05/23  CO-OX 60%. Off Epi. Currently on 4NE + 0.04 Vaso. CVP 9.   RUQ US  with dilated gallbladder but no wall thickening, fluid or stones. Tbili 15>13.  Feels okay. No dyspnea. Very fatigued. Little appetite but no abdominal pain.   Objective:   Weight Range: 77.1 kg Body mass index is 30.11 kg/m.   Vital Signs:   Temp:  [97.8 F (36.6 C)-98.4 F (36.9 C)] 97.9 F (36.6 C) (01/10 0400) Pulse Rate:  [69-107] 72 (01/10 0615) Resp:  [15-33] 15 (01/10 0615) BP: (61-146)/(49-115) 107/59 (01/10 0600) SpO2:  [89 %-100 %] 92 % (01/10 0615) FiO2 (%):  [24 %] 24 % (01/09 0815) Weight:  [77.1 kg] 77.1 kg (01/10 0448) Last BM Date : 12/10/23  Weight change: Filed Weights   12/08/23 0500 12/09/23 0500 12/10/23 0448  Weight: 79.1 kg 78.2 kg 77.1 kg   Intake/Output:   Intake/Output Summary (Last 24 hours) at 12/10/2023 0651 Last data filed at 12/10/2023 0600 Gross per 24 hour  Intake 3199.01 ml  Output 3441 ml  Net -241.99 ml    Physical Exam  CVP 9 General:  sitting at bedside chair; weak.  HEENT: +cortrak Neck: supple. JVD ~8 cm. Carotids 2+ bilat; no bruits. No lymphadenopathy or thyromegaly appreciated. LIJ trialysis Cor: PMI nondisplaced. Regular rate & irregular rhythm. No rubs, gallops or murmurs. Lungs: normal work of breathing Abdomen: soft, nontender, nondistended. No hepatosplenomegaly. No bruits or masses. Good bowel sounds. Extremities: no cyanosis, clubbing, rash, trace BLE edema  Neuro: AAOX3 Telemetry   NSR  Labs    CBC Recent Labs    12/09/23 1001 12/10/23 0339  WBC  19.7* 20.3*  NEUTROABS 16.7*  --   HGB 9.7* 9.6*  HCT 29.7* 29.7*  MCV 89.5 91.7  PLT 159 191   Basic Metabolic Panel Recent Labs    98/91/74 0338 12/08/23 0339 12/09/23 1613 12/10/23 0339  NA  --    < > 135 136  K  --    < > 5.0 4.3  CL  --    < > 100 100  CO2  --    < > 21* 26  GLUCOSE  --    < > 125* 145*  BUN  --    < > 30* 25*  CREATININE  --    < > 1.53* 1.39*  CALCIUM   --    < > 8.4* 8.6*  MG 2.6*  --   --   --   PHOS  --    < > 3.2 2.5   < > = values in this interval not displayed.   Liver Function Tests Recent Labs    12/09/23 0322 12/09/23 1613 12/10/23 0339  AST 44*  --  45*  ALT 37  --  34  ALKPHOS 94  --  83  BILITOT 15.2*  --  13.5*  PROT 7.0  --  6.9  ALBUMIN  3.0*  3.1* 3.2* 2.9*  2.9*   No results for input(s): LIPASE, AMYLASE in the last 72 hours. Cardiac Enzymes No results for input(s): CKTOTAL, CKMB, CKMBINDEX, TROPONINI in  the last 72 hours.  BNP: BNP (last 3 results) Recent Labs    05/26/23 1610 08/26/23 1103 09/22/23 0905  BNP 778.0* 781.0* 926.0*   Other results:  Imaging   US  Abdomen Limited RUQ (LIVER/GB) Result Date: 12/09/2023 CLINICAL DATA:  Hyperbilirubinemia. EXAM: ULTRASOUND ABDOMEN LIMITED RIGHT UPPER QUADRANT COMPARISON:  None Available. FINDINGS: Gallbladder: Dilated gallbladder. No shadowing stones, wall thickening or adjacent fluid. Common bile duct: Diameter: 3 mm Liver: No focal lesion identified. Within normal limits in parenchymal echogenicity. Portal vein is patent on color Doppler imaging with normal direction of blood flow towards the liver. Other: None. IMPRESSION: Dilated gallbladder but no wall thickening, fluid or stones. No biliary ductal dilatation. Electronically Signed   By: Ranell Bring M.D.   On: 12/09/2023 15:50       Medications:   Scheduled Medications:  arformoterol   15 mcg Nebulization BID   Chlorhexidine  Gluconate Cloth  6 each Topical Daily   darbepoetin (ARANESP ) injection -  NON-DIALYSIS  40 mcg Subcutaneous Q Tue-1800   feeding supplement  237 mL Oral BID BM   fiber supplement (BANATROL TF)  60 mL Per Tube TID   Gerhardt's butt cream   Topical BID   influenza vaccine adjuvanted  0.5 mL Intramuscular Tomorrow-1000   insulin  aspart  0-24 Units Subcutaneous Q4H   midodrine   15 mg Per Tube TID WC   multivitamin  1 tablet Per Tube QHS   mouth rinse  15 mL Mouth Rinse 4 times per day   pantoprazole  (PROTONIX ) IV  40 mg Intravenous Q24H   revefenacin   175 mcg Nebulization Daily   sodium chloride  flush  3 mL Intravenous Q12H    Infusions:   prismasol  BGK 4/2.5 500 mL/hr at 12/09/23 1800    prismasol  BGK 4/2.5 300 mL/hr at 12/09/23 1403   sodium chloride  10 mL/hr at 12/07/23 1630   sodium chloride  10 mL/hr at 12/10/23 0600   sodium chloride      amiodarone  60 mg/hr (12/10/23 0600)   feeding supplement (VITAL AF 1.2 CAL) 55 mL/hr at 12/10/23 0600   heparin  1,400 Units/hr (12/10/23 0600)   norepinephrine  4 mcg/min (12/10/23 0600)   prismasol  BGK 4/2.5     vasopressin  0.04 Units/min (12/10/23 0600)    PRN Medications: sodium chloride , sodium chloride , sodium chloride , artificial tears, bisacodyl  **OR** bisacodyl , docusate, heparin , HYDROmorphone  (DILAUDID ) injection, levalbuterol , ondansetron  (ZOFRAN ) IV, mouth rinse, oxyCODONE , pneumococcal 20-valent conjugate vaccine, sodium chloride , sodium chloride  flush, traZODone   Patient Profile   [name] is a 73 y.o. with history of nonischemic cardiomyopathy and severe MR s/p Mitral Clip x2 (2020), pHTN, and CKD IIIb.   Assessment/Plan  1. Post Cardiotomy Cardiogenic/vasoplegic Shock: -Bioprosthetic MVR and tricuspid ring 11/29/23 with Dr. Maryjane. Pre-op  cath with no significant CAD.  Pre-op  echo with EF 40-45%, severe MR. Went back to the OR with post-op tamponade for clean-out.  Echo 12/31 showed EF 25-30%, moderate RV dysfunction, s/p bioprosthetic mitral valve replacement with trivial MR and tricuspid  valve repair with trivial TR - Worsened on 1/2 and re-intubated in setting of volume overload, oliguric renal failure &  pulmonary hypertension  - Extubated on 12/04/22 - Sitting at bedside this morning; CVP 9. Epi/milrinone  weaned off. On low dose levophed  that can likely be weaned off today.  - Bilirubin improving.   2. Mitral regurgitation:   - TEE in 11/19 with severe MR, restricted posterior leaflet.  She had Mitraclip x 2 in 1/20. Post-op echo showed mild MR and mild mitral stenosis.   -  Echo in 9/24 showed MV s/p Mitraclip x 2 with mean gradient 6 mmHg and severe mitral regurgitation. TEE was then done to evaluate, showing 2 Mitraclips in place with mean gradient 4 mmHg and severe eccentric MR from between the 2 clips with ERO 0.54 cm^2.  She was not thought to be a candidate for an additional Mitraclip due to lack of room between the two existing clips. R/LHC (11/24) showed moderate mixed pulmonary arterial and pulmonary venous hypertension likely due to mitral valve regurgitation.  - S/p MV replacement and Tricuspid repair on 11/29/23 with Dr. Maryjane - Repeat limited TTE later this admission to re-evaluate RV function.   3. Ventricular ectopy  - Previously had frequent PVCs - Continue amiodarone  gtt at 30mg /hr; no longer having ectopy.   4. Pulmonary hypertension - Most recent RHC (11/24) with PVR 4.5, with moderate mixed pulmonary arterial and pulmonary venous hypertension likely due to mitral valve regurgitation. Now s/p MVR, PA systolic pressure lower today in 60s.  Suspect still mixed PAH/PVH with elevated PCWP.  NO was tried at 10 ppm but did not have much effect so now off.   - Will hold off on pulmonary vasodilators; small clinical trials demonstrate no improvement and some markers of harm with PDE5i in Group II PH 2/2 mitral valve disease despite MVR.  - Stable; PA catheter removed.   5. Toxic multinodular goiter: She is now off methimazole . TSH was normal in 8/24. Follows with  Endocrinology  6. AKI on CKD stage 3 - Volume management per CRRT - Plan to keep even for today per nephrology  7. Blood loss anemia - Hgb stable at 9.6.   8. Complete heart block: She had post-op CHB.  NSR today.   9. Hypoxic / Hypercapnic respiratory failure - stable on [place]  10. Atrial fibrillation: Patient previously in AF with controlled rate. Continue amio gtt.  - Continue hep gtt and amio gtt - Back in NSR this AM. Can transition to PO amiodarone  tomorrow.   11. ID:  - WBC ct up to 20k again - Completed 7 day course of vanc/merrem  with no obvious source of infection.  - RUQ unremarkable; will discuss with CCM. Holding antibiotics for the time being.   12. Thrombocytopenia - Improving; plt ct 191  13. Deconditioning - Chronic deconditioning; will need extensive PT.   Burnell Matlin 10:18 AM  CRITICAL CARE Performed by: Ria Commander   Total critical care time: 35 minutes  Critical care time was exclusive of separately billable procedures and treating other patients.  Critical care was necessary to treat or prevent imminent or life-threatening deterioration.  Critical care was time spent personally by me on the following activities: development of treatment plan with patient and/or surrogate as well as nursing, discussions with consultants, evaluation of patient's response to treatment, examination of patient, obtaining history from patient or surrogate, ordering and performing treatments and interventions, ordering and review of laboratory studies, ordering and review of radiographic studies, pulse oximetry and re-evaluation of patient's condition.

## 2023-12-10 NOTE — Progress Notes (Signed)
 PHARMACY - ANTICOAGULATION CONSULT NOTE  Pharmacy Consult for IV heparin  Indication: atrial fibrillation  No Known Allergies  Patient Measurements: Height: 5' 3 (160 cm) Weight: 77.1 kg (169 lb 15.6 oz) IBW/kg (Calculated) : 52.4 Heparin  Dosing Weight: ~ 70 kg  Vital Signs: Temp: 97.9 F (36.6 C) (01/10 0400) Temp Source: Oral (01/10 0400) BP: 101/61 (01/10 0715) Pulse Rate: 77 (01/10 0715)  Labs: Recent Labs    12/08/23 0339 12/08/23 1142 12/09/23 0322 12/09/23 0837 12/09/23 1001 12/09/23 1613 12/10/23 0339  HGB  --   --   --   --  9.7*  --  9.6*  HCT  --   --   --   --  29.7*  --  29.7*  PLT  --   --   --   --  159  --  191  APTT 68*   < >  --  71*  --  57* 85*  HEPARINUNFRC 0.26*  --   --  0.35  --   --  0.32  CREATININE 1.68*   < > 1.49*  --   --  1.53* 1.39*   < > = values in this interval not displayed.    Estimated Creatinine Clearance: 36 mL/min (A) (by C-G formula based on SCr of 1.39 mg/dL (H)).   Assessment: 73 yo female s/p scheduled bioprosthetic mitral valve replacement with tissue valve 12/30 now with afib, pharmacy asked to start anticoagulation with IV heparin . Spontaneously converted 1/7; flipped back to AF overnight 1/9 0300. Subsequently given amio bolus & gtt to 60 1/9; converted back to NSR 1/9 1900.  aPTT returned therapeutic at 85 while on 1400 unit/hr. Tbili remains elevated but improved to 13.5, will continue to utilize aPTT levels at this time. CBC stable and no issues with heparin  infusion at this time. Still requiring vasopressors, have now weaned off epinephrine .   Goal of Therapy:  Heparin  level 0.3-0.5 units/ml aPTT 66-85 sec Monitor platelets by anticoagulation protocol: Yes   Plan:  Continue heparin  at 1400 units/hr  Monitor aPTT due to elevated Tbili daily and as needed Transition to HL monitoring if levels continue to correlate x2 CBC daily and PRN Monitor for s/sx bleeding  Randall Call, PharmD PGY2 Critical Care Pharmacy  Resident **Pharmacist phone directory can now be found on [email or web address] ([email or web address]).  Listed under [facility].

## 2023-12-11 ENCOUNTER — Inpatient Hospital Stay (HOSPITAL_COMMUNITY): Payer: PPO

## 2023-12-11 DIAGNOSIS — I340 Nonrheumatic mitral (valve) insufficiency: Secondary | ICD-10-CM | POA: Diagnosis not present

## 2023-12-11 DIAGNOSIS — R570 Cardiogenic shock: Secondary | ICD-10-CM | POA: Diagnosis not present

## 2023-12-11 DIAGNOSIS — I272 Pulmonary hypertension, unspecified: Secondary | ICD-10-CM | POA: Diagnosis not present

## 2023-12-11 DIAGNOSIS — Z952 Presence of prosthetic heart valve: Secondary | ICD-10-CM | POA: Diagnosis not present

## 2023-12-11 LAB — HEPARIN LEVEL (UNFRACTIONATED)
Heparin Unfractionated: 0.62 [IU]/mL (ref 0.30–0.70)
Heparin Unfractionated: 0.21 [IU]/mL — ABNORMAL LOW (ref 0.30–0.70)

## 2023-12-11 LAB — GLUCOSE, CAPILLARY
Glucose-Capillary: 132 mg/dL — ABNORMAL HIGH (ref 70–99)
Glucose-Capillary: 143 mg/dL — ABNORMAL HIGH (ref 70–99)
Glucose-Capillary: 124 mg/dL — ABNORMAL HIGH (ref 70–99)
Glucose-Capillary: 140 mg/dL — ABNORMAL HIGH (ref 70–99)
Glucose-Capillary: 147 mg/dL — ABNORMAL HIGH (ref 70–99)
Glucose-Capillary: 134 mg/dL — ABNORMAL HIGH (ref 70–99)

## 2023-12-11 LAB — RENAL FUNCTION PANEL
Albumin: 3 g/dL — ABNORMAL LOW (ref 3.5–5.0)
Albumin: 2.9 g/dL — ABNORMAL LOW (ref 3.5–5.0)
Anion gap: 12 (ref 5–15)
Anion gap: 11 (ref 5–15)
BUN: 26 mg/dL — ABNORMAL HIGH (ref 8–23)
BUN: 26 mg/dL — ABNORMAL HIGH (ref 8–23)
CO2: 26 mmol/L (ref 22–32)
CO2: 25 mmol/L (ref 22–32)
Calcium: 8.9 mg/dL (ref 8.9–10.3)
Calcium: 8.9 mg/dL (ref 8.9–10.3)
Chloride: 96 mmol/L — ABNORMAL LOW (ref 98–111)
Chloride: 97 mmol/L — ABNORMAL LOW (ref 98–111)
Creatinine, Ser: 1.47 mg/dL — ABNORMAL HIGH (ref 0.44–1.00)
Creatinine, Ser: 1.52 mg/dL — ABNORMAL HIGH (ref 0.44–1.00)
GFR, Estimated: 38 mL/min — ABNORMAL LOW (ref >60)
GFR, Estimated: 36 mL/min — ABNORMAL LOW (ref >60)
Glucose, Bld: 148 mg/dL — ABNORMAL HIGH (ref 70–99)
Glucose, Bld: 133 mg/dL — ABNORMAL HIGH (ref 70–99)
Phosphorus: 3.7 mg/dL (ref 2.5–4.6)
Phosphorus: 3.3 mg/dL (ref 2.5–4.6)
Potassium: 4.9 mmol/L (ref 3.5–5.1)
Potassium: 5.4 mmol/L — ABNORMAL HIGH (ref 3.5–5.1)
Sodium: 134 mmol/L — ABNORMAL LOW (ref 135–145)
Sodium: 133 mmol/L — ABNORMAL LOW (ref 135–145)

## 2023-12-11 LAB — COOXEMETRY PANEL
Carboxyhemoglobin: 3 % — ABNORMAL HIGH (ref 0.5–1.5)
Methemoglobin: <0.7 % (ref 0.0–1.5)
O2 Saturation: 77.6 %
Total hemoglobin: 10 g/dL — ABNORMAL LOW (ref 12.0–16.0)

## 2023-12-11 LAB — MAGNESIUM: Magnesium: 2.8 mg/dL — ABNORMAL HIGH (ref 1.7–2.4)

## 2023-12-11 LAB — CBC
HCT: 31.2 % — ABNORMAL LOW (ref 36.0–46.0)
Hemoglobin: 9.8 g/dL — ABNORMAL LOW (ref 12.0–15.0)
MCH: 29.4 pg (ref 26.0–34.0)
MCHC: 31.4 g/dL (ref 30.0–36.0)
MCV: 93.7 fL (ref 80.0–100.0)
Platelets: 164 10*3/uL (ref 150–400)
RBC: 3.33 MIL/uL — ABNORMAL LOW (ref 3.87–5.11)
RDW: 21.4 % — ABNORMAL HIGH (ref 11.5–15.5)
WBC: 23.6 10*3/uL — ABNORMAL HIGH (ref 4.0–10.5)
nRBC: 2 % — ABNORMAL HIGH (ref 0.0–0.2)

## 2023-12-11 LAB — APTT
aPTT: 106 s — ABNORMAL HIGH (ref 24–36)
aPTT: 61 s — ABNORMAL HIGH (ref 24–36)

## 2023-12-11 LAB — PROCALCITONIN: Procalcitonin: 1.9 ng/mL

## 2023-12-11 MED ORDER — LINEZOLID 600 MG/300ML IV SOLN
600.0000 mg | Freq: Two times a day (BID) | INTRAVENOUS | Status: DC
  Administered 2023-12-11: 600 mg via INTRAVENOUS
  Filled 2023-12-11: qty 300

## 2023-12-11 MED ORDER — LINEZOLID 600 MG PO TABS
600.0000 mg | ORAL_TABLET | Freq: Two times a day (BID) | ORAL | Status: AC
  Administered 2023-12-11 – 2023-12-18 (×15): 600 mg
  Filled 2023-12-11 (×15): qty 1

## 2023-12-11 MED ORDER — SODIUM CHLORIDE 0.9 % IV SOLN
INTRAVENOUS | Status: DC | PRN
Start: 2023-12-11 — End: 2023-12-21

## 2023-12-11 MED ORDER — PRISMASOL BGK 0/2.5 32-2.5 MEQ/L EC SOLN
Status: DC
  Filled 2023-12-11 (×9): qty 5000

## 2023-12-11 MED ORDER — AMIODARONE HCL 200 MG PO TABS
200.0000 mg | ORAL_TABLET | Freq: Two times a day (BID) | ORAL | Status: DC
  Filled 2023-12-11: qty 1

## 2023-12-11 MED ORDER — SODIUM CHLORIDE 0.9 % IV SOLN: INTRAVENOUS | Status: AC | PRN

## 2023-12-11 MED ORDER — AMIODARONE HCL 200 MG PO TABS
400.0000 mg | ORAL_TABLET | Freq: Two times a day (BID) | ORAL | Status: DC
  Administered 2023-12-11 – 2023-12-13 (×5): 400 mg
  Filled 2023-12-11 (×5): qty 2

## 2023-12-11 MED ORDER — LINEZOLID 600 MG/300ML IV SOLN: 600.0000 mg | Freq: Two times a day (BID) | INTRAVENOUS | Status: DC

## 2023-12-11 MED ORDER — PIPERACILLIN-TAZOBACTAM 3.375 G IVPB
3.3750 g | Freq: Three times a day (TID) | INTRAVENOUS | Status: DC
  Administered 2023-12-11 – 2023-12-13 (×7): 3.375 g via INTRAVENOUS
  Filled 2023-12-11 (×7): qty 50

## 2023-12-11 MED ORDER — FAMOTIDINE 20 MG PO TABS
20.0000 mg | ORAL_TABLET | Freq: Every day | ORAL | Status: DC
  Administered 2023-12-11 – 2023-12-13 (×3): 20 mg
  Filled 2023-12-11 (×3): qty 1

## 2023-12-11 MED ORDER — LINEZOLID 600 MG PO TABS
600.0000 mg | ORAL_TABLET | Freq: Two times a day (BID) | ORAL | Status: DC
  Filled 2023-12-11: qty 1

## 2023-12-11 MED ORDER — AMIODARONE HCL 200 MG PO TABS
400.0000 mg | ORAL_TABLET | Freq: Two times a day (BID) | ORAL | Status: DC
  Administered 2023-12-11: 400 mg via ORAL
  Filled 2023-12-11: qty 2

## 2023-12-11 NOTE — Progress Notes (Addendum)
 12 Days Post-Op Procedure(s) (LRB): EXPLORATION POST OPERATIVE OPEN HEART (N/A) Subjective:  No specific complaints.  Was off NE last night but restarted due to hypotension when sleeping.  Now NE 3, vaso 0.04, midodrine  15 tid. CVP 5, Co-ox 77.6  Objective: Vital signs in last 24 hours: Temp:  [98 F (36.7 C)-99.3 F (37.4 C)] 98 F (36.7 C) (01/11 0720) Pulse Rate:  [62-80] 72 (01/11 0845) Cardiac Rhythm: Normal sinus rhythm;Heart block (01/11 0800) Resp:  [11-36] 26 (01/11 0900) BP: (81-136)/(43-90) 103/67 (01/11 0900) SpO2:  [89 %-100 %] 100 % (01/11 0845) FiO2 (%):  [28 %] 28 % (01/11 0715)  Hemodynamic parameters for last 24 hours: CVP:  [2 mmHg-59 mmHg] 5 mmHg  Intake/Output from previous day: 01/10 0701 - 01/11 0700 In: 3465.5 [P.O.:165; I.V.:1510.5; NG/GT:1790] Out: 3608  Intake/Output this shift: Total I/O In: 396.7 [P.O.:100; I.V.:119.9; NG/GT:160; IV Piggyback:16.8] Out: 347   General appearance: alert and cooperative Neurologic: intact Heart: regular rate and rhythm, S1, S2 normal, no murmur Lungs: diminished breath sounds bibasilar Abdomen: soft, non-tender; bowel sounds normal Extremities: no edema Wound: incision healing well  Lab Results: Recent Labs    12/10/23 0339 12/11/23 0106  WBC 20.3* 23.6*  HGB 9.6* 9.8*  HCT 29.7* 31.2*  PLT 191 164   BMET:  Recent Labs    12/10/23 1635 12/11/23 0106  NA 135 134*  K 4.3 4.9  CL 100 96*  CO2 25 26  GLUCOSE 142* 148*  BUN 19 26*  CREATININE 1.19* 1.47*  CALCIUM  8.5* 8.9    PT/INR: No results for input(s): LABPROT, INR in the last 72 hours. ABG    Component Value Date/Time   PHART 7.414 12/05/2023 0948   HCO3 23.7 12/05/2023 0948   TCO2 25 12/05/2023 0948   ACIDBASEDEF 1.0 12/05/2023 0948   O2SAT 77.6 12/11/2023 0111   CBG (last 3)  Recent Labs    12/10/23 2330 12/11/23 0341 12/11/23 0723  GLUCAP 115* 132* 143*    Assessment/Plan:  POD 12  Hemodynamics stable but still  vasopressor dependent to support BP. Weaning as tolerated. Echo yesterday with EF 40-45% and severe RV systolic dysfunction although I looked at echo and I don't think RV looks severely decreased. CVP is low at 5. AHF team following.  Tmax 99.3 with rising WBC to 23.6 and procal of 1.90. Started on Zosyn  and Zyvox  empirically. His right neck sleeve is from surgery and I would consider placing a new central line and removing it especially with mitral valve replacement and TV repair. Nephrology is not going to approve a PICC with renal failure.  Tolerating TF well but not eating much. She says she is full and doesn't have an appetite. Could consider night time tube feeds.   Continuing CRRT to keep even since CVP low. Could consider stopping CRRT and seeing if UO picks back up.  Continuing heparin .  Encourage IS.   LOS: 12 days    Dorise MARLA Fellers 12/11/2023

## 2023-12-11 NOTE — Plan of Care (Signed)
  Problem: Clinical Measurements: Goal: Cardiovascular complication will be avoided Outcome: Progressing   Problem: Activity: Goal: Risk for activity intolerance will decrease Outcome: Progressing   Problem: Nutrition: Goal: Adequate nutrition will be maintained Outcome: Not Progressing   Problem: Coping: Goal: Level of anxiety will decrease Outcome: Progressing   Problem: Elimination: Goal: Will not experience complications related to bowel motility Outcome: Not Progressing   Problem: Pain Management: Goal: General experience of comfort will improve Outcome: Progressing   Problem: Safety: Goal: Ability to remain free from injury will improve Outcome: Progressing   Problem: Skin Integrity: Goal: Risk for impaired skin integrity will decrease Outcome: Progressing

## 2023-12-11 NOTE — Progress Notes (Signed)
 Notified phlebotomy that patient will need to be stuck for scheduled labs.

## 2023-12-11 NOTE — Progress Notes (Signed)
 Per Dr. Malen Gauze and Dr. Gasper Lloyd, once CRRT filter clots- do not replace and restart. Planning for CRRT and line holiday. Order placed to reflect.

## 2023-12-11 NOTE — Progress Notes (Addendum)
 [facility] Progress Note   Assessment/ Plan:   Vanessa [name] is a/an 73 y.o. female with a past medical history HTN, NICM, CHF, pulmonary hypertension, mitral and tricuspid valve disease  who present w/ surgery for valvular disease c/b shock, AHRF, and AKI    Oliguric AKI on CKD 3A: Mild baseline CKD likely multifactorial.  Now with AKI secondary to ATN and cardiorenal syndrome in the setting of shock. Started on CRRT on 1/2 for worsening pulmonary edema and volume overload. Dialysis catheter placed by CCM, appreciate help - continue CRRT - 4K fluids   - UF goal keep even as tolerated.  CVP is 5.  Please page nephrology for UF goal changes.  Will reach out to CHF team  -Monitor Daily I/Os, Daily weight  -pharmacy is assisting with medication dosing for CRRT including meropenem  -Maintain MAP>65 for optimal renal perfusion.    Multifactorial shock: Vasoplegic and cardioplegia catheter surgery.   - Has been on pressors and inotropes.  Now off milrinone .  Continue management per primary team - pressor requirement decreasing overall     - note she was also started on midodrine   - s/p antibiotics (s/p meropenem  and s/p vanc) and is s/p stress dose steroids    Acute exacerbation of heart failure with reduced ejection fraction: Volume overload.  - Optimize volume with CRRT as above    Severe MR and tricuspid valvular disease:  - Status post repair.  CT surgery managing   Postoperative cardiac tamponade:  - Continue monitoring per cardiology   Hyponatremia: In the setting of AKI, improved on CRRT.     Anemia: Related to blood loss and compounded by AKI and critical illness.    - Transfusions per primary team.   - Started aranesp  40 mcg weekly on Tuesdays - anticipate may need to increase  Hyperbilirubinemia - per primary team.  Has plateaued but elevated.   Disposition - in ICU on CRRT   Subjective:    Seen and examined on CRRT.  Procedure supervised.  She has had  one unmeasured urine output over 1/9/ and 1/10 charted.  She had 3.3 liters UF over 1/10 with CRRT as of this note.  UF goal has been keep even.  She was off of levo yesterday and then needed it back overnight after getting trazodone .  Now levo at 5 mcg/min and on vasopressin  at 0.04 units/min (never was off).  She was increased to 6 liters but this was due to the oxygen modality - RN thinks she would be fine on 2 liters nasal cannula and is trying this now.  CVP is 5.  On 2 liters she is 98% after the change per RN.   Review of systems:      Denies shortness of breath or air hunger Denies chest pain  Denies n/v She got a PRN and slept ok last night - grateful    Objective:   BP (!) 86/54   Pulse 79   Temp 99.3 F (37.4 C) (Axillary)   Resp (!) 26   Ht 5' 3 (1.6 m)   Wt 77.1 kg   SpO2 100%   BMI 30.11 kg/m   Intake/Output Summary (Last 24 hours) at 12/11/2023 0550 Last data filed at 12/11/2023 0500 Gross per 24 hour  Intake 3484.93 ml  Output 3534 ml  Net -49.07 ml   Weight change:   Physical Exam:    General elderly female in no acute distress HEENT normocephalic atraumatic extraocular movements intact sclera anicteric  Neck supple trachea midline Lungs clear but reduced on auscultation bilaterally normal work of breathing at rest on room air.  CVP 5.  Initially high flow at 6 liters then RN changed to nasal cannula at 2 liters  Heart S1S2 no rub Abdomen soft nontender nondistended Extremities no lower extremity edema appreciated Psych no anxiety or agitation Neuro - alert and interactive; conversant and follows commands Access left internal jugular non-tunneled dialysis catheter   Imaging: ECHOCARDIOGRAM LIMITED Result Date: 12/10/2023    ECHOCARDIOGRAM LIMITED REPORT   Patient Name:   Vanessa [name] Date of Exam: 12/10/2023 Medical Rec #:  [id number]        Height:       63.0 in Accession #:    [id number]       Weight:       170.0 lb Date of Birth:  [birth year]         BSA:           1.804 m Patient Age:    72 years         BP:           124/59 mmHg Patient Gender: F                HR:           63 bpm. Exam Location:  Inpatient Procedure: Limited Echo, Limited Color Doppler and Intracardiac Opacification            Agent Indications:    Dilated cardiomyopathy I42.0  History:        Patient has prior history of Echocardiogram examinations, most                 recent 12/01/2023. Cardiomyopathy and CHF, Pulmonary HTN,                 Arrythmias:PVC; Risk Factors:Hypertension.  Sonographer:    Thea Norlander RCS Referring Phys: [id number] TORIBIO BROCKS SMITH IMPRESSIONS  1. Left ventricular ejection fraction, by estimation, is 40 to 45%. The left ventricle has mildly decreased function. The left ventricle demonstrates global hypokinesis.  2. Right ventricular systolic function is severely reduced. The right ventricular size is normal.  3. Left atrial size was moderately dilated.  4. There is turbulence over the prosthetic MV prosthesis but gradients not measured. Overall valve appears to be functioning well. The mitral valve has been repaired/replaced. Trivial mitral valve regurgitation. MV gradients not measured on this limited  study to assess for mitral stenosis.  5. The tricuspid valve is has been repaired/replaced. Tricuspid valve regurgitation is not demonstrated.  6. The aortic valve is normal in structure. There is mild calcification of the aortic valve. Aortic valve regurgitation is not visualized. Aortic valve sclerosis/calcification is present, without any evidence of aortic stenosis.  7. The inferior vena cava is dilated in size with <50% respiratory variability, suggesting right atrial pressure of 15 mmHg. Conclusion(s)/Recommendation(s): Pericardial effusion has resolved. However there is still abnormal septal bounce and a markedly dilated IVC. Would consider pericardial constriction. Suggest cMRI to furher evalaute, if clinically indicated. FINDINGS  Left Ventricle: Left  ventricular ejection fraction, by estimation, is 40 to 45%. The left ventricle has mildly decreased function. The left ventricle demonstrates global hypokinesis. Definity  contrast agent was given IV to delineate the left ventricular  endocardial borders. The left ventricular internal cavity size was normal in size. There is no left ventricular hypertrophy. Abnormal (paradoxical) septal motion, consistent with left bundle branch block. Right Ventricle: The right ventricular  size is normal. No increase in right ventricular wall thickness. Right ventricular systolic function is severely reduced. Left Atrium: Left atrial size was moderately dilated. Right Atrium: Right atrial size was normal in size. Pericardium: There is no evidence of pericardial effusion. Mitral Valve: There is turbulence over the prosthetic MV prosthesis but gradients not measured. Overall valve appears to be functioning well. The mitral valve has been repaired/replaced. Trivial mitral valve regurgitation. There is a 31 mm bioprosthetic valve present in the mitral position. MV gradients not measured on this limited study to assess for mitral valve stenosis. Tricuspid Valve: The tricuspid valve is has been repaired/replaced. Tricuspid valve regurgitation is not demonstrated. No evidence of tricuspid stenosis. Aortic Valve: The aortic valve is normal in structure. There is mild calcification of the aortic valve. Aortic valve regurgitation is not visualized. Aortic valve sclerosis/calcification is present, without any evidence of aortic stenosis. Pulmonic Valve: The pulmonic valve was normal in structure. Pulmonic valve regurgitation is not visualized. No evidence of pulmonic stenosis. Aorta: The aortic root is normal in size and structure. Venous: The inferior vena cava is dilated in size with less than 50% respiratory variability, suggesting right atrial pressure of 15 mmHg. IAS/Shunts: No atrial level shunt detected by color flow Doppler. LEFT  VENTRICLE PLAX 2D LVIDd:         4.80 cm LVIDs:         4.30 cm LV PW:         0.90 cm LV IVS:        0.90 cm LVOT diam:     1.70 cm LVOT Area:     2.27 cm  IVC IVC diam: 2.20 cm LEFT ATRIUM         Index LA diam:    3.80 cm 2.11 cm/m   AORTA Ao Root diam: 2.80 cm Ao Asc diam:  3.30 cm TRICUSPID VALVE TR Peak grad:   44.1 mmHg TR Vmax:        332.00 cm/s  SHUNTS Systemic Diam: 1.70 cm Toribio Fuel MD Electronically signed by Toribio Fuel MD Signature Date/Time: 12/10/2023/11:24:48 AM    Final    DG Chest Port 1 View Result Date: 12/10/2023 CLINICAL DATA:  73 year old female status post cardiac surgery last month. Pneumonia. EXAM: PORTABLE CHEST 1 VIEW COMPARISON:  Portable chest 12/07/2023 and earlier. FINDINGS: Portable AP semi upright view at 0714 hours. Enteric feeding tube courses to the abdomen as before, tip not included. Left subclavian and right IJ vascular catheters are unchanged. Stable cardiomegaly and mediastinal contours. Prosthetic cardiac valves. Stable mediastinal contours. Left lung base hypo ventilation appears unchanged. No air bronchograms identified. No pneumothorax or pulmonary edema. Right lung base is negative. Stable visualized osseous structures. IMPRESSION: 1.  Stable lines and tubes. 2. Cardiopulmonary with prosthetic cardiac valves. Unchanged left lung base hypo ventilation. No pneumothorax or pulmonary edema identified. Electronically Signed   By: VEAR Hurst M.D.   On: 12/10/2023 09:35   US  EKG SITE RITE Result Date: 12/10/2023 If Site Rite image not attached, placement could not be confirmed due to current cardiac rhythm.  US  Abdomen Limited RUQ (LIVER/GB) Result Date: 12/09/2023 CLINICAL DATA:  Hyperbilirubinemia. EXAM: ULTRASOUND ABDOMEN LIMITED RIGHT UPPER QUADRANT COMPARISON:  None Available. FINDINGS: Gallbladder: Dilated gallbladder. No shadowing stones, wall thickening or adjacent fluid. Common bile duct: Diameter: 3 mm Liver: No focal lesion identified. Within  normal limits in parenchymal echogenicity. Portal vein is patent on color Doppler imaging with normal direction of blood flow towards  the liver. Other: None. IMPRESSION: Dilated gallbladder but no wall thickening, fluid or stones. No biliary ductal dilatation. Electronically Signed   By: Ranell Bring M.D.   On: 12/09/2023 15:50    Labs: BMET Recent Labs  Lab 12/08/23 0339 12/08/23 1613 12/08/23 1745 12/09/23 0322 12/09/23 1613 12/10/23 0339 12/10/23 1635 12/11/23 0106  NA 135 133*  --  134* 135 136 135 134*  K 4.3 5.0 4.5 4.6 5.0 4.3 4.3 4.9  CL 102 98  --  100 100 100 100 96*  CO2 23 20*  --  24 21* 26 25 26   GLUCOSE 150* 162*  --  168* 125* 145* 142* 148*  BUN 36* 38*  --  29* 30* 25* 19 26*  CREATININE 1.68* 1.63*  --  1.49* 1.53* 1.39* 1.19* 1.47*  CALCIUM  8.1* 8.4*  --  8.5* 8.4* 8.6* 8.5* 8.9  PHOS 2.8 2.7  --  2.6 3.2 2.5 2.5 3.7   CBC Recent Labs  Lab 12/07/23 0354 12/09/23 1001 12/10/23 0339 12/11/23 0106  WBC 16.6* 19.7* 20.3* 23.6*  NEUTROABS  --  16.7*  --   --   HGB 8.9* 9.7* 9.6* 9.8*  HCT 26.2* 29.7* 29.7* 31.2*  MCV 86.8 89.5 91.7 93.7  PLT 153 159 191 164    Medications:     arformoterol   15 mcg Nebulization BID   Chlorhexidine  Gluconate Cloth  6 each Topical Daily   darbepoetin (ARANESP ) injection - NON-DIALYSIS  40 mcg Subcutaneous Q Tue-1800   feeding supplement  237 mL Oral BID BM   fiber supplement (BANATROL TF)  60 mL Per Tube TID   Gerhardt's butt cream   Topical BID   influenza vaccine adjuvanted  0.5 mL Intramuscular Tomorrow-1000   insulin  aspart  0-24 Units Subcutaneous Q4H   midodrine   15 mg Per Tube TID WC   multivitamin  1 tablet Per Tube QHS   mouth rinse  15 mL Mouth Rinse 4 times per day   pantoprazole  (PROTONIX ) IV  40 mg Intravenous Q24H   revefenacin   175 mcg Nebulization Daily   sodium chloride  flush  3 mL Intravenous Q12H   Katheryn JAYSON Saba, MD 12/11/2023, 6:07 AM     Addendum:  Spoke with CHF team.  She may benefit  from a line holiday.  CVP is 5 /fine.  Will not restart CRRT when filter clots or cartridge expires. Order placed   Katheryn JAYSON Saba, MD 1:30 PM 12/11/2023

## 2023-12-11 NOTE — Procedures (Signed)
 Consent obtained R subclavian area prepped draped  US  guidance used, procedure made difficult as patient cannot lay flat comfortably and had rapid breathing pattern.  Obtained access to R subclavian vein but wire would not thread past introducer Left CVL in place to hold spot and got CXR to confirm.  Then removed introducer and attempted to re-thread R subclavian line, unable to make the turn despite multiple tries.  There is a question of PTX which is certainly possible with all the manipulation near pleural line.  Will get CXR at 1400.  In meantime, only noncompatable drip is amio, will switch to PO and try to give her a break.  If more access needed can do left subclavian (although ipsilateral HD cath may be problematic or just do a more caudal RIJ.  Will update primary.  Rolan Labella MD PCCM

## 2023-12-11 NOTE — Progress Notes (Addendum)
 PHARMACY - ANTICOAGULATION CONSULT NOTE  Pharmacy Consult for IV heparin  Indication: atrial fibrillation  No Known Allergies  Patient Measurements: Height: 5' 3 (160 cm) Weight: 77.1 kg (169 lb 15.6 oz) IBW/kg (Calculated) : 52.4 Heparin  Dosing Weight: ~ 70 kg  Vital Signs: Temp: 99.3 F (37.4 C) (01/11 0342) Temp Source: Axillary (01/11 0342) BP: 103/60 (01/11 0600) Pulse Rate: 71 (01/11 0600)  Labs: Recent Labs    12/09/23 0837 12/09/23 0837 12/09/23 1001 12/09/23 1613 12/10/23 0339 12/10/23 1635 12/11/23 0106  HGB  --    < > 9.7*  --  9.6*  --  9.8*  HCT  --   --  29.7*  --  29.7*  --  31.2*  PLT  --   --  159  --  191  --  164  APTT 71*  --   --  57* 85*  --  106*  HEPARINUNFRC 0.35  --   --   --  0.32  --  0.62  CREATININE  --   --   --  1.53* 1.39* 1.19* 1.47*   < > = values in this interval not displayed.    Estimated Creatinine Clearance: 34 mL/min (A) (by C-G formula based on SCr of 1.47 mg/dL (H)).   Assessment: 73 yo female s/p scheduled bioprosthetic mitral valve replacement with tissue valve 12/30 now with afib, pharmacy asked to start anticoagulation with IV heparin . Spontaneously converted 1/7; flipped back to AF overnight 1/9 0300. Subsequently given amio bolus & gtt to 60 1/9; converted back to NSR 1/9 1900.  aPTT now supratherapeutic at 106 while on 1400 unit/hr. Not certain aPTT and heparin  levels are corresponding at this time, so will continue to utilize aPTT levels at this time. CBC stable and no issues with heparin  infusion at this time. Still requiring vasopressors, have now weaned off epinephrine .   Goal of Therapy:  Heparin  level 0.3-0.5 units/ml aPTT 66-85 sec Monitor platelets by anticoagulation protocol: Yes   Plan:  Decrease heparin  to 1300 units/hr  Monitor aPTT in 6 hours due to elevated Tbili daily and as needed Transition to HL monitoring if levels continue to correlate x2 CBC daily and PRN Monitor for s/sx bleeding  Signe Dawn, PharmD PGY2 Cardiology Pharmacy Resident  Update: 12/11/23 15:44 Heparin  level is subtherapeutic at 0.21 on UFH IV infusion 1300 units/hour. Patient has remained stable with not signs of bleeding or bruising. Increase IV UFH infuion to 1350 units/hour Heparin  levels are corresponding the aPTT now, so will switch to just monitoring heparin  levels. Check next heparin  level with AM labs Continue to monitor daily heparin  levels, CBC, and signs of bleeding.  Signe Dawn, PharmD PGY2 Cardiology Pharmacy Resident

## 2023-12-11 NOTE — Progress Notes (Signed)
 NAME:  Vanessa [name], MRN:  [id number], DOB:  03/11/1951, LOS: 12 ADMISSION DATE:  11/29/2023, CONSULTATION DATE:  11/29/23 REFERRING MD:  Dr. Maryjane, CHIEF COMPLAINT:  postop   History of Present Illness:  72yoF with PMH significant for HTN, NICM, HFrEF (EF40-45%), PHTN, CKD and hx of prior severe MR s/p prior mitral clip x 2 in 2020.  Pt with progressive DOE found to have increasing MR  and moderate TR despite adjustments in GDMT.  Not felt to be candidate for additional mitral clip.  Repeat LHC without CAD but showed moderate PHTN.  Underwent mitral valve replacement with tissue valve and tricuspid valve repair by Dr. Maryjane on 12/30.  PCCM consulted to assist with vent and medical management post-operatively in ICU.   Pertinent  Medical History  Never smoker, MR w/ previous Mitral clip x 2 (2020), NICM, HFrEF, PHTN, HTN, CKD3b  Significant Hospital Events: Including procedures, antibiotic start and stop dates in addition to other pertinent events   MVR by Dr. Maryjane; relook for tamponade/washout later in evening without obvious source 1/2 intubated for respiratory decompensation, iNO started. Vanc, meropenem  started. CRRT started.  1/5 extubated 1/8 converted to NSR overnight 1/9 back to A.fib around 0200  Interim History / Subjective:     prismasol  BGK 4/2.5 500 mL/hr at 12/10/23 1727    prismasol  BGK 4/2.5 300 mL/hr at 12/09/23 1403   sodium chloride  Stopped (12/11/23 0700)   amiodarone  60 mg/hr (12/11/23 0700)   feeding supplement (VITAL AF 1.2 CAL) 55 mL/hr at 12/11/23 0700   heparin  1,300 Units/hr (12/11/23 0700)   norepinephrine  4 mcg/min (12/11/23 0700)   prismasol  BGK 4/2.5 1,300 mL/hr at 12/10/23 1628   vasopressin  0.04 Units/min (12/11/23 0700)   No events. Remains on CRRT, even.  Objective   Blood pressure (!) 94/57, pulse 69, temperature 99.3 F (37.4 C), temperature source Axillary, resp. rate 16, height 5' 3 (1.6 m), weight 77.1 kg, SpO2 99%. 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 motion  WBC/Pct still going up CXR unchanged  Resolved Hospital Problem list :   Postoperative vent management  Assessment & Plan:  Severe MR (s/p prior mitral clip x 2 in 2020) now s/p tissue MVR Moderate TR s/p TV repair  Moderate PHTN> progressed to severe postop; PA pressures significantly proved with volume removal today NICM, HFrEF, EF 40-45% Post bypass vasoplegia and cardioplegia - ongoing Postop tamponade resolved after relook and washout on day 1 Paroxysmal A-flutter, Afib- now sinus Muscular deconditioning Rising WBC/Pct, new pressor need concerning for uncontrolled sepsis- nontoxic appearing at present Postop renal failure on CRRT  - CRRT and inotropes per TCTS/AHF discussion ? Give a break from CRRT and line holiday if pressor needs better - Start Linezolid , zosyn , trend WBC and Pct - Continue midodrine  - Drop amio to 30 - Vaso levo for MAP 65 - IS, chair as tolerated - Heparin  gtt  Best Practice (right click and Reselect all SmartList Selections daily)   Diet/type: TF, regular as ordered; not  taking in enough DVT prophylaxis systemic heparin  Pressure ulcer(s): None GI prophylaxis: PPI Lines: Centra line, HD line Foley:  Yes, and it is still needed Code Status:  full code Last date of multidisciplinary goals of care discussion [per primary]  31 min cc time  Rolan Sharps MD PCCM  Pulmonary & Critical Care Medicine For pager details, please see AMION or use Epic chat  After 1900, please call [facility]  for cross coverage needs 12/11/2023, 7:10 AM

## 2023-12-11 NOTE — Progress Notes (Signed)
 Patient ID: Vanessa [name], female   DOB: Jun 07, 1951, 73 y.o.   MRN: [id number]      Advanced Heart Failure Rounding Note  Cardiologist: Dr. Rolan  Chief Complaint: Cardiogenic shock  Subjective:   S/P MVR (tissue) and TV ring and same day return to OR for tamponade/bleeding. 1/2: Progressive volume overload with poor UOP, CVVH started but ended up re-intubated.   Extubated 12/05/23  - Hypotensive overnight; restarted on low dose levophed  & vaso 0.04. Rising WBC ct today.  - Laying comfortably in bed on my exam.    Objective:   Weight Range: 77.1 kg Body mass index is 30.11 kg/m.   Vital Signs:   Temp:  [98 F (36.7 C)-99.3 F (37.4 C)] 98 F (36.7 C) (01/11 0720) Pulse Rate:  [62-80] 79 (01/11 1115) Resp:  [11-36] 32 (01/11 1115) BP: (81-136)/(43-90) 124/59 (01/11 1115) SpO2:  [89 %-100 %] 93 % (01/11 1115) FiO2 (%):  [28 %] 28 % (01/11 0715) Last BM Date : 12/10/23  Weight change: Filed Weights   12/08/23 0500 12/09/23 0500 12/10/23 0448  Weight: 79.1 kg 78.2 kg 77.1 kg   Intake/Output:   Intake/Output Summary (Last 24 hours) at 12/11/2023 1206 Last data filed at 12/11/2023 1000 Gross per 24 hour  Intake 3273.75 ml  Output 3461 ml  Net -187.25 ml    Physical Exam  CVP 5 General:  laying in bed; no complaints.  HEENT: +cortrak Neck: supple. JVD ~6cm. Carotids 2+ bilat; no bruits. LIJ trialysis catheter in place Cor: PMI nondisplaced. RRR Lungs: normal work of breathing Abdomen: soft, nontender, nondistended. No hepatosplenomegaly. No bruits or masses. Good bowel sounds. Extremities: no cyanosis, clubbing, rash, trace BLE edema  Neuro: AAOX3 Telemetry   NSR  Labs    CBC Recent Labs    12/09/23 1001 12/10/23 0339 12/11/23 0106  WBC 19.7* 20.3* 23.6*  NEUTROABS 16.7*  --   --   HGB 9.7* 9.6* 9.8*  HCT 29.7* 29.7* 31.2*  MCV 89.5 91.7 93.7  PLT 159 191 164   Basic Metabolic Panel Recent Labs    98/89/74 1635 12/11/23 0106  NA 135 134*  K  4.3 4.9  CL 100 96*  CO2 25 26  GLUCOSE 142* 148*  BUN 19 26*  CREATININE 1.19* 1.47*  CALCIUM  8.5* 8.9  MG  --  2.8*  PHOS 2.5 3.7   Liver Function Tests Recent Labs    12/09/23 0322 12/09/23 1613 12/10/23 0339 12/10/23 1635 12/11/23 0106  AST 44*  --  45*  --   --   ALT 37  --  34  --   --   ALKPHOS 94  --  83  --   --   BILITOT 15.2*  --  13.5*  --   --   PROT 7.0  --  6.9  --   --   ALBUMIN  3.0*  3.1*   < > 2.9*  2.9* 2.9* 3.0*   < > = values in this interval not displayed.   No results for input(s): LIPASE, AMYLASE in the last 72 hours. Cardiac Enzymes No results for input(s): CKTOTAL, CKMB, CKMBINDEX, TROPONINI in the last 72 hours.  BNP: BNP (last 3 results) Recent Labs    05/26/23 1610 08/26/23 1103 09/22/23 0905  BNP 778.0* 781.0* 926.0*   Other results:  Imaging   DG Chest 1V REPEAT Same Day Result Date: 12/11/2023 CLINICAL DATA:  Central line placement. EXAM: CHEST - 1 VIEW SAME DAY COMPARISON:  Earlier same  day FINDINGS: The tip of the right subclavian central line appears to have been repositioned in the interval. The course of the catheter is atypical in the tip projects over the right lung apex. This could potentially be in the right internal jugular vein. There is some lucency in the right apex without a discrete pleural line visible, similar to yesterday's chest x-ray. Right IJ sheath has been removed in the interval. Left IJ central line tip projects over the proximal SVC level. A feeding tube passes into the stomach although the distal tip position is not included on the film. The cardio pericardial silhouette is enlarged. There is pulmonary vascular congestion without overt pulmonary edema. Retrocardiac left base atelectasis or infiltrate with probable small left effusion is similar to prior. IMPRESSION: 1. Interval repositioning of the right subclavian central line. The course of the catheter is atypical and the tip projects over the  right lung apex. This could potentially be in the right internal jugular vein although catheter tip position cannot be definitively confirmed on a single AP projection of the chest. 2. Persistent lucency in the right apex without a discrete pleural line visible to confirm pneumothorax. If there is clinical concern for pneumothorax, right-side-up decubitus film could be used to further evaluate. 3. No other significant interval change. Electronically Signed   By: Camellia Candle M.D.   On: 12/11/2023 11:34       Medications:   Scheduled Medications:  amiodarone   200 mg Oral BID   Chlorhexidine  Gluconate Cloth  6 each Topical Daily   darbepoetin (ARANESP ) injection - NON-DIALYSIS  40 mcg Subcutaneous Q Tue-1800   feeding supplement  237 mL Oral BID BM   fiber supplement (BANATROL TF)  60 mL Per Tube TID   Gerhardt's butt cream   Topical BID   influenza vaccine adjuvanted  0.5 mL Intramuscular Tomorrow-1000   insulin  aspart  0-24 Units Subcutaneous Q4H   linezolid   600 mg Oral Q12H   midodrine   15 mg Per Tube TID WC   multivitamin  1 tablet Per Tube QHS   mouth rinse  15 mL Mouth Rinse 4 times per day   pantoprazole  (PROTONIX ) IV  40 mg Intravenous Q24H   sodium chloride  flush  3 mL Intravenous Q12H    Infusions:   prismasol  BGK 4/2.5 500 mL/hr at 12/11/23 0829    prismasol  BGK 4/2.5 300 mL/hr at 12/09/23 1403   sodium chloride  Stopped (12/11/23 0700)   sodium chloride  Stopped (12/11/23 0739)   sodium chloride  10 mL/hr at 12/11/23 1000   feeding supplement (VITAL AF 1.2 CAL) 55 mL/hr at 12/11/23 1000   heparin  1,300 Units/hr (12/11/23 1000)   norepinephrine  3 mcg/min (12/11/23 1137)   piperacillin -tazobactam (ZOSYN )  IV 12.5 mL/hr at 12/11/23 1000   prismasol  BGK 4/2.5 1,300 mL/hr at 12/11/23 1028   vasopressin  0.04 Units/min (12/11/23 1135)    PRN Medications: sodium chloride , sodium chloride , sodium chloride , artificial tears, bisacodyl  **OR** bisacodyl , docusate, heparin ,  HYDROmorphone  (DILAUDID ) injection, levalbuterol , ondansetron  (ZOFRAN ) IV, mouth rinse, oxyCODONE , pneumococcal 20-valent conjugate vaccine, sodium chloride , sodium chloride  flush, traZODone   Patient Profile   Vanessa [name] is a 73 y.o. with history of nonischemic cardiomyopathy and severe MR s/p Mitral Clip x2 (2020), pHTN, and CKD IIIb.   Assessment/Plan  1. Post Cardiotomy Cardiogenic & septic shock. -Bioprosthetic MVR and tricuspid ring 11/29/23 with Dr. Maryjane. Pre-op  cath with no significant CAD.  Pre-op  echo with EF 40-45%, severe MR. Went back to the OR with post-op tamponade  for clean-out.  Echo 12/31 showed EF 25-30%, moderate RV dysfunction, s/p bioprosthetic mitral valve replacement with trivial MR and tricuspid valve repair with trivial TR - Worsened on 1/2 and re-intubated in setting of volume overload, oliguric renal failure &  pulmonary hypertension  - Extubated on 12/04/22 - Hypotensive overnight I suspect secondary to underlying sepsis. Right internal jugular line removed. Unable to place new one despite excellent efforts by Dr. Claudene.  - Procal & WBC ct elevated. Zosyn  restarted with Zyvox . May benefit from line free holiday.  - CVP 5 prior to pulling right internal jugular line; would match I/Os on CRRT.  - Will D/C amio gtt, start 400 PO BID.   2. Mitral regurgitation:   - TEE in 11/19 with severe MR, restricted posterior leaflet.  She had Mitraclip x 2 in 1/20. Post-op echo showed mild MR and mild mitral stenosis.   - Echo in 9/24 showed MV s/p Mitraclip x 2 with mean gradient 6 mmHg and severe mitral regurgitation. TEE was then done to evaluate, showing 2 Mitraclips in place with mean gradient 4 mmHg and severe eccentric MR from between the 2 clips with ERO 0.54 cm^2.  She was not thought to be a candidate for an additional Mitraclip due to lack of room between the two existing clips. R/LHC (11/24) showed moderate mixed pulmonary arterial and pulmonary venous hypertension  likely due to mitral valve regurgitation.  - S/p MV replacement and Tricuspid repair on 11/29/23 with Dr. Maryjane - Repeat limited TTE later this admission to re-evaluate RV function.   3. Ventricular ectopy  - Previously had frequent PVCs - D/C amio gtt.  - Amio 400 BID.   4. Pulmonary hypertension - Most recent RHC (11/24) with PVR 4.5, with moderate mixed pulmonary arterial and pulmonary venous hypertension likely due to mitral valve regurgitation. Now s/p MVR, PA systolic pressure lower today in 60s.  Suspect still mixed PAH/PVH with elevated PCWP.  NO was tried at 10 ppm but did not have much effect so now off.   - Will hold off on pulmonary vasodilators; small clinical trials demonstrate no improvement and some markers of harm with PDE5i in Group II PH 2/2 mitral valve disease despite MVR.  - Stable; PA catheter removed.   5. Toxic multinodular goiter: She is now off methimazole . TSH was normal in 8/24. Follows with Endocrinology  6. AKI on CKD stage 3 - Volume management per CRRT - Plan to keep even today; CVP 5.   7. Blood loss anemia - Hgb stable.   8. Complete heart block: She had post-op CHB.  NSR today.   9. Hypoxic / Hypercapnic respiratory failure - stable on L[place]  10. Atrial fibrillation: Patient previously in AF with controlled rate. Continue amio gtt.  - Continue hep gtt and amio gtt - Back in NSR this AM. Can transition to PO amiodarone  tomorrow.   11. ID:  - WBC ct rising again. Zosyn /Zyvox  restarted. Seea bove.  - Completed 7 day course of vanc/merrem  with no obvious source of infection.  - RUQ unremarkable; will discuss with CCM. Holding antibiotics for the time being.   12. Thrombocytopenia - Improving; plt ct stable  13. Deconditioning - Chronic deconditioning; will need extensive PT.   Ronel Rodeheaver 12:06 PM  CRITICAL CARE Performed by: Ria Commander   Total critical care time: 35 minutes  Critical care time was exclusive of separately  billable procedures and treating other patients.  Critical care was necessary to treat or prevent imminent  or life-threatening deterioration.  Critical care was time spent personally by me on the following activities: development of treatment plan with patient and/or surrogate as well as nursing, discussions with consultants, evaluation of patient's response to treatment, examination of patient, obtaining history from patient or surrogate, ordering and performing treatments and interventions, ordering and review of laboratory studies, ordering and review of radiographic studies, pulse oximetry and re-evaluation of patient's condition.

## 2023-12-11 NOTE — Progress Notes (Signed)
 Pharmacy Antibiotic Note  Vanessa [name] is a 73 y.o. female admitted on 11/29/2023 for valve repair, now w/ concern for sepsis given pressor requirements and leukocytosis. Patient has not improved despite full courses of vancomycin  + meropenem . Pharmacy has been consulted for linezolid  and Zosyn  dosing.  Pt on CRRT. Planning for 7 days total of treatment.  Previous antibiotics: Cefazolin  12/30>1/1 Vancomycin  1/3 > 1/9 Merrem  1/3 > 1/9  Culture/Panel Data: SARS-Cov-2: negative  Plan: Linezolid  600 mg PO every 12 hours Zosyn  3.375 gm IV every 8 hours F/u CRRT plans, currently keeping even volume status  Height: 5' 3 (160 cm) Weight: 77.1 kg (169 lb 15.6 oz) IBW/kg (Calculated) : 52.4  Temp (24hrs), Avg:98.4 F (36.9 C), Min:98 F (36.7 C), Max:99.3 F (37.4 C)  Recent Labs  Lab 12/06/23 0418 12/06/23 1544 12/07/23 0354 12/07/23 1617 12/09/23 0322 12/09/23 1001 12/09/23 1613 12/10/23 0339 12/10/23 1635 12/11/23 0106  WBC 15.4*  --  16.6*  --   --  19.7*  --  20.3*  --  23.6*  CREATININE 1.66*   < > 1.60*   < > 1.49*  --  1.53* 1.39* 1.19* 1.47*   < > = values in this interval not displayed.    Estimated Creatinine Clearance: 34 mL/min (A) (by C-G formula based on SCr of 1.47 mg/dL (H)).    No Known Allergies  Thank you for allowing pharmacy to be a part of this patient's care.  Signe Dawn, PharmD PGY2 Cardiology Pharmacy Resident 12/11/2023 11:46 AM

## 2023-12-11 NOTE — Progress Notes (Signed)
 Patient ID: Vanessa [name], female   DOB: 08-01-1951, 73 y.o.   MRN: [id number]  TCTS Evening Rounds:  Hemodynamically stable in sinus rhythm. Remains on NE 4, vaso 0.04. Sats 96% RA  Right neck line is out. Her only access is HD cath with drips through the purple port. Getting amio po.  CRRT continues. Nephrology planning to stop if filter clots.  BMET    Component Value Date/Time   NA 133 (L) 12/11/2023 1458   K 5.4 (H) 12/11/2023 1458   CL 97 (L) 12/11/2023 1458   CO2 25 12/11/2023 1458   GLUCOSE 133 (H) 12/11/2023 1458   BUN 26 (H) 12/11/2023 1458   CREATININE 1.52 (H) 12/11/2023 1458   CREATININE 1.30 (H) 04/29/2016 1144   CALCIUM  8.9 12/11/2023 1458   EGFR 35.0 07/15/2023 1433   GFRNONAA 36 (L) 12/11/2023 1458

## 2023-12-12 DIAGNOSIS — Z952 Presence of prosthetic heart valve: Secondary | ICD-10-CM | POA: Diagnosis not present

## 2023-12-12 DIAGNOSIS — I272 Pulmonary hypertension, unspecified: Secondary | ICD-10-CM | POA: Diagnosis not present

## 2023-12-12 DIAGNOSIS — R570 Cardiogenic shock: Secondary | ICD-10-CM | POA: Diagnosis not present

## 2023-12-12 DIAGNOSIS — I340 Nonrheumatic mitral (valve) insufficiency: Secondary | ICD-10-CM | POA: Diagnosis not present

## 2023-12-12 LAB — HEPATIC FUNCTION PANEL
ALT: 44 U/L (ref 0–44)
AST: 41 U/L (ref 15–41)
Albumin: 2.9 g/dL — ABNORMAL LOW (ref 3.5–5.0)
Alkaline Phosphatase: 76 U/L (ref 38–126)
Bilirubin, Direct: 7.7 mg/dL — ABNORMAL HIGH (ref 0.0–0.2)
Indirect Bilirubin: 5.3 mg/dL — ABNORMAL HIGH (ref 0.3–0.9)
Total Bilirubin: 13 mg/dL — ABNORMAL HIGH (ref 0.0–1.2)
Total Protein: 7.5 g/dL (ref 6.5–8.1)

## 2023-12-12 LAB — RENAL FUNCTION PANEL
Albumin: 3 g/dL — ABNORMAL LOW (ref 3.5–5.0)
Anion gap: 12 (ref 5–15)
BUN: 23 mg/dL (ref 8–23)
CO2: 25 mmol/L (ref 22–32)
Calcium: 8.9 mg/dL (ref 8.9–10.3)
Chloride: 97 mmol/L — ABNORMAL LOW (ref 98–111)
Creatinine, Ser: 1.37 mg/dL — ABNORMAL HIGH (ref 0.44–1.00)
GFR, Estimated: 41 mL/min — ABNORMAL LOW (ref >60)
Glucose, Bld: 111 mg/dL — ABNORMAL HIGH (ref 70–99)
Phosphorus: 3.6 mg/dL (ref 2.5–4.6)
Potassium: 4.4 mmol/L (ref 3.5–5.1)
Sodium: 134 mmol/L — ABNORMAL LOW (ref 135–145)

## 2023-12-12 LAB — GLUCOSE, CAPILLARY
Glucose-Capillary: 155 mg/dL — ABNORMAL HIGH (ref 70–99)
Glucose-Capillary: 144 mg/dL — ABNORMAL HIGH (ref 70–99)
Glucose-Capillary: 126 mg/dL — ABNORMAL HIGH (ref 70–99)
Glucose-Capillary: 100 mg/dL — ABNORMAL HIGH (ref 70–99)
Glucose-Capillary: 148 mg/dL — ABNORMAL HIGH (ref 70–99)

## 2023-12-12 LAB — CBC
HCT: 32.9 % — ABNORMAL LOW (ref 36.0–46.0)
Hemoglobin: 10.4 g/dL — ABNORMAL LOW (ref 12.0–15.0)
MCH: 29.9 pg (ref 26.0–34.0)
MCHC: 31.6 g/dL (ref 30.0–36.0)
MCV: 94.5 fL (ref 80.0–100.0)
Platelets: 164 10*3/uL (ref 150–400)
RBC: 3.48 MIL/uL — ABNORMAL LOW (ref 3.87–5.11)
RDW: 21.8 % — ABNORMAL HIGH (ref 11.5–15.5)
WBC: 25.3 10*3/uL — ABNORMAL HIGH (ref 4.0–10.5)
nRBC: 2.6 % — ABNORMAL HIGH (ref 0.0–0.2)

## 2023-12-12 LAB — HEPARIN LEVEL (UNFRACTIONATED)
Heparin Unfractionated: 0.56 [IU]/mL (ref 0.30–0.70)
Heparin Unfractionated: 0.66 [IU]/mL (ref 0.30–0.70)

## 2023-12-12 LAB — PROCALCITONIN: Procalcitonin: 1.15 ng/mL

## 2023-12-12 LAB — GAMMA GT: GGT: 69 U/L — ABNORMAL HIGH (ref 7–50)

## 2023-12-12 LAB — APTT
aPTT: 91 s — ABNORMAL HIGH (ref 24–36)
aPTT: 110 s — ABNORMAL HIGH (ref 24–36)

## 2023-12-12 LAB — MAGNESIUM: Magnesium: 2.8 mg/dL — ABNORMAL HIGH (ref 1.7–2.4)

## 2023-12-12 MED ORDER — NOREPINEPHRINE 4 MG/250ML-% IV SOLN: 2.0000 ug/min | INTRAVENOUS | Status: DC

## 2023-12-12 MED ORDER — SODIUM CHLORIDE 0.9 % IV SOLN: 250.0000 mL | INTRAVENOUS | Status: DC

## 2023-12-12 MED ORDER — NOREPINEPHRINE 16 MG/250ML-% IV SOLN
0.0000 ug/min | INTRAVENOUS | Status: DC
  Administered 2023-12-12: 7 ug/min via INTRAVENOUS
  Administered 2023-12-13: 5 ug/min via INTRAVENOUS
  Filled 2023-12-12: qty 250

## 2023-12-12 NOTE — Progress Notes (Signed)
 RN to reassess [facility] plan tomorrow and consult IV Team as needed.

## 2023-12-12 NOTE — Progress Notes (Signed)
 Patient ID: Vanessa [name], female   DOB: 1951/09/20, 73 y.o.   MRN: [id number]      Advanced Heart Failure Rounding Note  Cardiologist: Dr. Rolan  Chief Complaint: Cardiogenic shock  Subjective:   S/P MVR (tissue) and TV ring and same day return to OR for tamponade/bleeding. 1/2: Progressive volume overload with poor UOP, CVVH started but ended up re-intubated.   Extubated 12/05/23  - Nauseas this morning; otherwise, no complaints.  - D/C CRRT this AM due to rising WBC ct.  - Bilirubin continuing to improve slowly.  - levophed  3-58mcg/vasopressin  down to 0.03.    Objective:   Weight Range: 75.3 kg Body mass index is 29.41 kg/m.   Vital Signs:   Temp:  [97.5 F (36.4 C)-98.6 F (37 C)] 97.5 F (36.4 C) (01/12 0800) Pulse Rate:  [66-91] 91 (01/12 1030) Resp:  [10-32] 16 (01/12 1030) BP: (76-124)/(47-91) 98/68 (01/12 1030) SpO2:  [92 %-100 %] 100 % (01/12 1030) Weight:  [75.3 kg] 75.3 kg (01/12 0500) Last BM Date : 12/10/23  Weight change: Filed Weights   12/09/23 0500 12/10/23 0448 12/12/23 0500  Weight: 78.2 kg 77.1 kg 75.3 kg   Intake/Output:   Intake/Output Summary (Last 24 hours) at 12/12/2023 1112 Last data filed at 12/12/2023 1000 Gross per 24 hour  Intake 2537.81 ml  Output 2511 ml  Net 26.81 ml    Physical Exam  General: Nauseas HEENT: +cortrak Neck: supple. JVP 7; trialysis catheter removed.  Cor: RRR Lungs: normal work of breathing Abdomen: soft, nontender, nondistended. No hepatosplenomegaly. No bruits or masses. Good bowel sounds. Extremities: no cyanosis, clubbing, rash, trace BLE edema  Neuro: AAOX3 Telemetry   NSR  Labs    CBC Recent Labs    12/11/23 0106 12/12/23 0215  WBC 23.6* 25.3*  HGB 9.8* 10.4*  HCT 31.2* 32.9*  MCV 93.7 94.5  PLT 164 164   Basic Metabolic Panel Recent Labs    98/88/74 0106 12/11/23 1458 12/12/23 0215  NA 134* 133* 134*  K 4.9 5.4* 4.4  CL 96* 97* 97*  CO2 26 25 25   GLUCOSE 148* 133* 111*  BUN  26* 26* 23  CREATININE 1.47* 1.52* 1.37*  CALCIUM  8.9 8.9 8.9  MG 2.8*  --  2.8*  PHOS 3.7 3.3 3.6   Liver Function Tests Recent Labs    12/10/23 0339 12/10/23 1635 12/12/23 0215 12/12/23 0939  AST 45*  --   --  41  ALT 34  --   --  44  ALKPHOS 83  --   --  76  BILITOT 13.5*  --   --  13.0*  PROT 6.9  --   --  7.5  ALBUMIN  2.9*  2.9*   < > 3.0* 2.9*   < > = values in this interval not displayed.   No results for input(s): LIPASE, AMYLASE in the last 72 hours. Cardiac Enzymes No results for input(s): CKTOTAL, CKMB, CKMBINDEX, TROPONINI in the last 72 hours.  BNP: BNP (last 3 results) Recent Labs    05/26/23 1610 08/26/23 1103 09/22/23 0905  BNP 778.0* 781.0* 926.0*   Other results:  Imaging   DG Chest Port 1 View Result Date: 12/11/2023 CLINICAL DATA:  Pneumothorax EXAM: PORTABLE CHEST 1 VIEW COMPARISON:  Same day chest x-ray FINDINGS: Previously seen right central line has been removed. Left IJ central venous catheter is stable in positioning. Enteric tube courses below the diaphragm with distal tip beyond the inferior margin of the film. Stable cardiomegaly  status post sternotomy and cardiac valve replacement. No new focal airspace consolidation. No pneumothorax is seen. IMPRESSION: 1. Interval removal of right central line. No pneumothorax. 2. Stable cardiomegaly. Electronically Signed   By: Mabel Converse D.O.   On: 12/11/2023 16:22   DG CHEST PORT 1 VIEW Result Date: 12/11/2023 CLINICAL DATA:  Encounter for central line placement. EXAM: PORTABLE CHEST 1 VIEW COMPARISON:  One-view chest x-ray 12/10/2023 FINDINGS: The heart is enlarged. Atherosclerotic calcifications are present at the aortic arch. Left pleural effusion and retrocardiac airspace disease is again noted. Mild pulmonary vascular congestion is stable. A left IJ line terminates in the innominate vein. Right IJ sheath is stable. A new right subclavian line is in place. The proximal aspect of the  line is coiled, presumably outside of the patient. The tip is directed cephalad into the internal jugular vein. No pneumothorax is present. The visualized soft tissues and bony thorax are unremarkable. IMPRESSION: 1. New right subclavian line. The proximal aspect of the line is coiled, presumably outside of the patient. The tip is directed cephalad into the internal jugular vein. 2. Stable left pleural effusion and retrocardiac airspace disease. 3. Stable cardiomegaly and mild pulmonary vascular congestion. These results were called by telephone at the time of interpretation on 12/11/2023 at 11:35 am to provider [facility] , who verbally acknowledged these results. Electronically Signed   By: Lonni Necessary M.D.   On: 12/11/2023 12:11   DG Chest 1V REPEAT Same Day Result Date: 12/11/2023 CLINICAL DATA:  Central line placement. EXAM: CHEST - 1 VIEW SAME DAY COMPARISON:  Earlier same day FINDINGS: The tip of the right subclavian central line appears to have been repositioned in the interval. The course of the catheter is atypical in the tip projects over the right lung apex. This could potentially be in the right internal jugular vein. There is some lucency in the right apex without a discrete pleural line visible, similar to yesterday's chest x-ray. Right IJ sheath has been removed in the interval. Left IJ central line tip projects over the proximal SVC level. A feeding tube passes into the stomach although the distal tip position is not included on the film. The cardio pericardial silhouette is enlarged. There is pulmonary vascular congestion without overt pulmonary edema. Retrocardiac left base atelectasis or infiltrate with probable small left effusion is similar to prior. IMPRESSION: 1. Interval repositioning of the right subclavian central line. The course of the catheter is atypical and the tip projects over the right lung apex. This could potentially be in the right internal jugular vein although  catheter tip position cannot be definitively confirmed on a single AP projection of the chest. 2. Persistent lucency in the right apex without a discrete pleural line visible to confirm pneumothorax. If there is clinical concern for pneumothorax, right-side-up decubitus film could be used to further evaluate. 3. No other significant interval change. Electronically Signed   By: Camellia Candle M.D.   On: 12/11/2023 11:34       Medications:   Scheduled Medications:  amiodarone   400 mg Per Tube BID   Chlorhexidine  Gluconate Cloth  6 each Topical Daily   darbepoetin (ARANESP ) injection - NON-DIALYSIS  40 mcg Subcutaneous Q Tue-1800   famotidine   20 mg Per Tube Daily   feeding supplement  237 mL Oral BID BM   fiber supplement (BANATROL TF)  60 mL Per Tube TID   Gerhardt's butt cream   Topical BID   influenza vaccine adjuvanted  0.5 mL  Intramuscular Tomorrow-1000   insulin  aspart  0-24 Units Subcutaneous Q4H   linezolid   600 mg Per Tube Q12H   midodrine   15 mg Per Tube TID WC   multivitamin  1 tablet Per Tube QHS   mouth rinse  15 mL Mouth Rinse 4 times per day   sodium chloride  flush  3 mL Intravenous Q12H    Infusions:  sodium chloride  5 mL/hr at 12/12/23 0400   sodium chloride  Stopped (12/11/23 1110)   feeding supplement (VITAL AF 1.2 CAL) 55 mL/hr at 12/12/23 1000   heparin  1,300 Units/hr (12/12/23 1000)   norepinephrine  3 mcg/min (12/12/23 1000)   piperacillin -tazobactam (ZOSYN )  IV Stopped (12/12/23 0955)   vasopressin  0.03 Units/min (12/12/23 1000)    PRN Medications: sodium chloride , sodium chloride , artificial tears, bisacodyl  **OR** bisacodyl , docusate, HYDROmorphone  (DILAUDID ) injection, levalbuterol , ondansetron  (ZOFRAN ) IV, mouth rinse, oxyCODONE , pneumococcal 20-valent conjugate vaccine, sodium chloride  flush, traZODone   Patient Profile   Vanessa [name] is a 73 y.o. with history of nonischemic cardiomyopathy and severe MR s/p Mitral Clip x2 (2020), pHTN, and CKD IIIb.    Assessment/Plan  1. Post Cardiotomy Cardiogenic & septic shock. -Bioprosthetic MVR and tricuspid ring 11/29/23 with Dr. Maryjane. Pre-op  cath with no significant CAD.  Pre-op  echo with EF 40-45%, severe MR. Went back to the OR with post-op tamponade for clean-out.  Echo 12/31 showed EF 25-30%, moderate RV dysfunction, s/p bioprosthetic mitral valve replacement with trivial MR and tricuspid valve repair with trivial TR - Worsened on 1/2 and re-intubated in setting of volume overload, oliguric renal failure &  pulmonary hypertension  - Extubated on 12/04/22 - After discussion with CCM & nephrology today, decision made to stop CRRT and remove trialysis catheter for 48H line free period. Patient continues to have rising WBC ct, elevated procal despite multiple courses of antibiotics. I remain slightly concerned about GB etiology, however, bilirubin improving. No Motta sign on exam.  - Weaning off vasopressin  this AM with plan to use peripheral levophed  as needed.   2. Mitral regurgitation:   - TEE in 11/19 with severe MR, restricted posterior leaflet.  She had Mitraclip x 2 in 1/20. Post-op echo showed mild MR and mild mitral stenosis.   - Echo in 9/24 showed MV s/p Mitraclip x 2 with mean gradient 6 mmHg and severe mitral regurgitation. TEE was then done to evaluate, showing 2 Mitraclips in place with mean gradient 4 mmHg and severe eccentric MR from between the 2 clips with ERO 0.54 cm^2.  She was not thought to be a candidate for an additional Mitraclip due to lack of room between the two existing clips. R/LHC (11/24) showed moderate mixed pulmonary arterial and pulmonary venous hypertension likely due to mitral valve regurgitation.  - S/p MV replacement and Tricuspid repair on 11/29/23 with Dr. Maryjane - Repeat limited TTE later this admission to re-evaluate RV function.   3. Ventricular ectopy  - NSR on telemetry.  - D/C amio gtt.  - Amio 400 BID.   4. Pulmonary hypertension - Most recent RHC  (11/24) with PVR 4.5, with moderate mixed pulmonary arterial and pulmonary venous hypertension likely due to mitral valve regurgitation. Now s/p MVR, PA systolic pressure lower today in 60s.  Suspect still mixed PAH/PVH with elevated PCWP.  NO was tried at 10 ppm but did not have much effect so now off.   - Will hold off on pulmonary vasodilators; small clinical trials demonstrate no improvement and some markers of harm with PDE5i in Group II  PH 2/2 mitral valve disease despite MVR.  - stable.   5. Toxic multinodular goiter: She is now off methimazole . TSH was normal in 8/24. Follows with Endocrinology  6. AKI on CKD stage 3 - Holding CRRT for next 48h to allow for line free period starting morning of 12/12/23.   7. Blood loss anemia - Hgb stable.   8. Complete heart block: She had post-op CHB.  NSR today.   9. Hypoxic / Hypercapnic respiratory failure - stable on   10. Atrial fibrillation: Patient previously in AF with controlled rate. Continue amio gtt.  - Remains in NSR; amio 400mg  PO BID  11. ID:  - WBC ct rising again. Zosyn /Zyvox  restarted. Seea bove.  - Completed 7 day course of vanc/merrem  with no obvious source of infection.  - RUQ unremarkable; will discuss with CCM. Holding antibiotics for the time being.   12. Thrombocytopenia - Improving; plt ct stable  13. Deconditioning - Chronic deconditioning; will need extensive PT.   Arvin Abello 11:12 AM  CRITICAL CARE Performed by: Ria Commander   Total critical care time: 37 minutes  Critical care time was exclusive of separately billable procedures and treating other patients.  Critical care was necessary to treat or prevent imminent or life-threatening deterioration.  Critical care was time spent personally by me on the following activities: development of treatment plan with patient and/or surrogate as well as nursing, discussions with consultants, evaluation of patient's response to treatment, examination  of patient, obtaining history from patient or surrogate, ordering and performing treatments and interventions, ordering and review of laboratory studies, ordering and review of radiographic studies, pulse oximetry and re-evaluation of patient's condition.

## 2023-12-12 NOTE — Progress Notes (Signed)
 [facility] Progress Note   Assessment/ Plan:   Vanessa [name] is a/an 73 y.o. female with a past medical history HTN, NICM, CHF, pulmonary hypertension, mitral and tricuspid valve disease  who present w/ surgery for valvular disease c/b shock, AHRF, and AKI    Oliguric AKI on CKD 3A: Mild baseline CKD likely multifactorial.  Now with AKI secondary to ATN and cardiorenal syndrome in the setting of shock. Started on CRRT on 1/2 for worsening pulmonary edema and volume overload. Dialysis catheter placed by CCM, appreciate help - On CRRT.  UF goal keep even as tolerated.  Will not restart CRRT when cartridge clots or circuit expires.  Will reach out to CHF team - may just need to stop CRRT sometime today.  Will then assess dialysis needs daily after a planned line holiday  -Monitor Daily I/Os, Daily weight    Multifactorial shock: Vasoplegic and cardioplegia catheter surgery.   - Has been on pressors and inotropes.  Now off milrinone .  Continue management per primary team - pressor requirement decreasing overall      - she is on midodrine   - s/p antibiotics (s/p meropenem  and s/p vanc) and is s/p stress dose steroids.  Resume antibiotics per primary team discretion  - planning for line holiday as above    Acute exacerbation of heart failure with reduced ejection fraction: Volume overload.  - Optimize volume with renal replacement therapy as above    Severe MR and tricuspid valvular disease:  - Status post repair.  CT surgery managing   Postoperative cardiac tamponade:  - Continue monitoring per cardiology   Hyponatremia: In the setting of AKI, improved on CRRT.     Anemia: Related to blood loss and compounded by AKI and critical illness.    - Transfusions per primary team.   - Started aranesp  40 mcg weekly on Tuesdays   Hyperbilirubinemia - per primary team.  Has plateaued but elevated.   Disposition - in ICU on CRRT   Subjective:    Seen and examined on CRRT.   Procedure supervised.  Left internal jugular vascath.  Spoke with CHF team on 1/11 as noted.  They feel that she may benefit from a line holiday and her volume status is much improved - we planned to not restart CRRT when filter clots or cartridge expires.  She had no charted urine output over 1/11.  She had 2.9 liters UF over 1/11 with CRRT with a goal of keep even.  Changed to 2K dialysate yesterday evening.  She has been on levophed  at 5 mcg/min and vasopressin  at 0.04 units/min.  She has been on room air.  No line for CVP.  Feels ok this AM   Review of systems:       Denies shortness of breath or air hunger Denies chest pain  Denies n/v.  Ate some grits  Slept a couple of hours with a PRN   Objective:   BP (!) 102/57   Pulse 72   Temp 98.5 F (36.9 C) (Oral)   Resp 12   Ht 5' 3 (1.6 m)   Wt 75.3 kg   SpO2 100%   BMI 29.41 kg/m   Intake/Output Summary (Last 24 hours) at 12/12/2023 9347 Last data filed at 12/12/2023 0600 Gross per 24 hour  Intake 3055.58 ml  Output 3027 ml  Net 28.58 ml   Weight change:   Physical Exam:     General elderly female in no acute distress HEENT normocephalic atraumatic  extraocular movements intact sclera anicteric Neck supple trachea midline Lungs clear but reduced on auscultation bilaterally normal work of breathing at rest on room air.   Heart S1S2 no rub Abdomen soft nontender nondistended Extremities no lower extremity edema appreciated Psych no anxiety or agitation Neuro - alert and interactive; conversant and follows commands Access left internal jugular non-tunneled dialysis catheter   Imaging: DG Chest Port 1 View Result Date: 12/11/2023 CLINICAL DATA:  Pneumothorax EXAM: PORTABLE CHEST 1 VIEW COMPARISON:  Same day chest x-ray FINDINGS: Previously seen right central line has been removed. Left IJ central venous catheter is stable in positioning. Enteric tube courses below the diaphragm with distal tip beyond the inferior margin of the  film. Stable cardiomegaly status post sternotomy and cardiac valve replacement. No new focal airspace consolidation. No pneumothorax is seen. IMPRESSION: 1. Interval removal of right central line. No pneumothorax. 2. Stable cardiomegaly. Electronically Signed   By: Mabel Converse D.O.   On: 12/11/2023 16:22   DG CHEST PORT 1 VIEW Result Date: 12/11/2023 CLINICAL DATA:  Encounter for central line placement. EXAM: PORTABLE CHEST 1 VIEW COMPARISON:  One-view chest x-ray 12/10/2023 FINDINGS: The heart is enlarged. Atherosclerotic calcifications are present at the aortic arch. Left pleural effusion and retrocardiac airspace disease is again noted. Mild pulmonary vascular congestion is stable. A left IJ line terminates in the innominate vein. Right IJ sheath is stable. A new right subclavian line is in place. The proximal aspect of the line is coiled, presumably outside of the patient. The tip is directed cephalad into the internal jugular vein. No pneumothorax is present. The visualized soft tissues and bony thorax are unremarkable. IMPRESSION: 1. New right subclavian line. The proximal aspect of the line is coiled, presumably outside of the patient. The tip is directed cephalad into the internal jugular vein. 2. Stable left pleural effusion and retrocardiac airspace disease. 3. Stable cardiomegaly and mild pulmonary vascular congestion. These results were called by telephone at the time of interpretation on 12/11/2023 at 11:35 am to provider [facility] , who verbally acknowledged these results. Electronically Signed   By: Lonni Necessary M.D.   On: 12/11/2023 12:11   DG Chest 1V REPEAT Same Day Result Date: 12/11/2023 CLINICAL DATA:  Central line placement. EXAM: CHEST - 1 VIEW SAME DAY COMPARISON:  Earlier same day FINDINGS: The tip of the right subclavian central line appears to have been repositioned in the interval. The course of the catheter is atypical in the tip projects over the right lung apex.  This could potentially be in the right internal jugular vein. There is some lucency in the right apex without a discrete pleural line visible, similar to yesterday's chest x-ray. Right IJ sheath has been removed in the interval. Left IJ central line tip projects over the proximal SVC level. A feeding tube passes into the stomach although the distal tip position is not included on the film. The cardio pericardial silhouette is enlarged. There is pulmonary vascular congestion without overt pulmonary edema. Retrocardiac left base atelectasis or infiltrate with probable small left effusion is similar to prior. IMPRESSION: 1. Interval repositioning of the right subclavian central line. The course of the catheter is atypical and the tip projects over the right lung apex. This could potentially be in the right internal jugular vein although catheter tip position cannot be definitively confirmed on a single AP projection of the chest. 2. Persistent lucency in the right apex without a discrete pleural line visible to confirm pneumothorax. If there  is clinical concern for pneumothorax, right-side-up decubitus film could be used to further evaluate. 3. No other significant interval change. Electronically Signed   By: Camellia Candle M.D.   On: 12/11/2023 11:34   ECHOCARDIOGRAM LIMITED Result Date: 12/10/2023    ECHOCARDIOGRAM LIMITED REPORT   Patient Name:   Vanessa [name] Date of Exam: 12/10/2023 Medical Rec #:  [id number]        Height:       63.0 in Accession #:    [id number]       Weight:       170.0 lb Date of Birth:  30-Sep-1951         BSA:          1.804 m Patient Age:    72 years         BP:           124/59 mmHg Patient Gender: F                HR:           63 bpm. Exam Location:  Inpatient Procedure: Limited Echo, Limited Color Doppler and Intracardiac Opacification            Agent Indications:    Dilated cardiomyopathy I42.0  History:        Patient has prior history of Echocardiogram examinations, most                  recent 12/01/2023. Cardiomyopathy and CHF, Pulmonary HTN,                 Arrythmias:PVC; Risk Factors:Hypertension.  Sonographer:    Thea Norlander RCS Referring Phys: [id number] TORIBIO BROCKS SMITH IMPRESSIONS  1. Left ventricular ejection fraction, by estimation, is 40 to 45%. The left ventricle has mildly decreased function. The left ventricle demonstrates global hypokinesis.  2. Right ventricular systolic function is severely reduced. The right ventricular size is normal.  3. Left atrial size was moderately dilated.  4. There is turbulence over the prosthetic MV prosthesis but gradients not measured. Overall valve appears to be functioning well. The mitral valve has been repaired/replaced. Trivial mitral valve regurgitation. MV gradients not measured on this limited  study to assess for mitral stenosis.  5. The tricuspid valve is has been repaired/replaced. Tricuspid valve regurgitation is not demonstrated.  6. The aortic valve is normal in structure. There is mild calcification of the aortic valve. Aortic valve regurgitation is not visualized. Aortic valve sclerosis/calcification is present, without any evidence of aortic stenosis.  7. The inferior vena cava is dilated in size with <50% respiratory variability, suggesting right atrial pressure of 15 mmHg. Conclusion(s)/Recommendation(s): Pericardial effusion has resolved. However there is still abnormal septal bounce and a markedly dilated IVC. Would consider pericardial constriction. Suggest cMRI to furher evalaute, if clinically indicated. FINDINGS  Left Ventricle: Left ventricular ejection fraction, by estimation, is 40 to 45%. The left ventricle has mildly decreased function. The left ventricle demonstrates global hypokinesis. Definity  contrast agent was given IV to delineate the left ventricular  endocardial borders. The left ventricular internal cavity size was normal in size. There is no left ventricular hypertrophy. Abnormal (paradoxical) septal motion,  consistent with left bundle branch block. Right Ventricle: The right ventricular size is normal. No increase in right ventricular wall thickness. Right ventricular systolic function is severely reduced. Left Atrium: Left atrial size was moderately dilated. Right Atrium: Right atrial size was normal in size. Pericardium: There is no evidence of pericardial effusion.  Mitral Valve: There is turbulence over the prosthetic MV prosthesis but gradients not measured. Overall valve appears to be functioning well. The mitral valve has been repaired/replaced. Trivial mitral valve regurgitation. There is a 31 mm bioprosthetic valve present in the mitral position. MV gradients not measured on this limited study to assess for mitral valve stenosis. Tricuspid Valve: The tricuspid valve is has been repaired/replaced. Tricuspid valve regurgitation is not demonstrated. No evidence of tricuspid stenosis. Aortic Valve: The aortic valve is normal in structure. There is mild calcification of the aortic valve. Aortic valve regurgitation is not visualized. Aortic valve sclerosis/calcification is present, without any evidence of aortic stenosis. Pulmonic Valve: The pulmonic valve was normal in structure. Pulmonic valve regurgitation is not visualized. No evidence of pulmonic stenosis. Aorta: The aortic root is normal in size and structure. Venous: The inferior vena cava is dilated in size with less than 50% respiratory variability, suggesting right atrial pressure of 15 mmHg. IAS/Shunts: No atrial level shunt detected by color flow Doppler. LEFT VENTRICLE PLAX 2D LVIDd:         4.80 cm LVIDs:         4.30 cm LV PW:         0.90 cm LV IVS:        0.90 cm LVOT diam:     1.70 cm LVOT Area:     2.27 cm  IVC IVC diam: 2.20 cm LEFT ATRIUM         Index LA diam:    3.80 cm 2.11 cm/m   AORTA Ao Root diam: 2.80 cm Ao Asc diam:  3.30 cm TRICUSPID VALVE TR Peak grad:   44.1 mmHg TR Vmax:        332.00 cm/s  SHUNTS Systemic Diam: 1.70 cm Toribio Fuel MD Electronically signed by Toribio Fuel MD Signature Date/Time: 12/10/2023/11:24:48 AM    Final    DG Chest Port 1 View Result Date: 12/10/2023 CLINICAL DATA:  73 year old female status post cardiac surgery last month. Pneumonia. EXAM: PORTABLE CHEST 1 VIEW COMPARISON:  Portable chest 12/07/2023 and earlier. FINDINGS: Portable AP semi upright view at 0714 hours. Enteric feeding tube courses to the abdomen as before, tip not included. Left subclavian and right IJ vascular catheters are unchanged. Stable cardiomegaly and mediastinal contours. Prosthetic cardiac valves. Stable mediastinal contours. Left lung base hypo ventilation appears unchanged. No air bronchograms identified. No pneumothorax or pulmonary edema. Right lung base is negative. Stable visualized osseous structures. IMPRESSION: 1.  Stable lines and tubes. 2. Cardiopulmonary with prosthetic cardiac valves. Unchanged left lung base hypo ventilation. No pneumothorax or pulmonary edema identified. Electronically Signed   By: VEAR Hurst M.D.   On: 12/10/2023 09:35   US  EKG SITE RITE Result Date: 12/10/2023 If Site Rite image not attached, placement could not be confirmed due to current cardiac rhythm.   Labs: BMET Recent Labs  Lab 12/09/23 0322 12/09/23 1613 12/10/23 0339 12/10/23 1635 12/11/23 0106 12/11/23 1458 12/12/23 0215  NA 134* 135 136 135 134* 133* 134*  K 4.6 5.0 4.3 4.3 4.9 5.4* 4.4  CL 100 100 100 100 96* 97* 97*  CO2 24 21* 26 25 26 25 25   GLUCOSE 168* 125* 145* 142* 148* 133* 111*  BUN 29* 30* 25* 19 26* 26* 23  CREATININE 1.49* 1.53* 1.39* 1.19* 1.47* 1.52* 1.37*  CALCIUM  8.5* 8.4* 8.6* 8.5* 8.9 8.9 8.9  PHOS 2.6 3.2 2.5 2.5 3.7 3.3 3.6   CBC Recent Labs  Lab 12/09/23  1001 12/10/23 0339 12/11/23 0106 12/12/23 0215  WBC 19.7* 20.3* 23.6* 25.3*  NEUTROABS 16.7*  --   --   --   HGB 9.7* 9.6* 9.8* 10.4*  HCT 29.7* 29.7* 31.2* 32.9*  MCV 89.5 91.7 93.7 94.5  PLT 159 191 164 164     Medications:     amiodarone   400 mg Per Tube BID   Chlorhexidine  Gluconate Cloth  6 each Topical Daily   darbepoetin (ARANESP ) injection - NON-DIALYSIS  40 mcg Subcutaneous Q Tue-1800   famotidine   20 mg Per Tube Daily   feeding supplement  237 mL Oral BID BM   fiber supplement (BANATROL TF)  60 mL Per Tube TID   Gerhardt's butt cream   Topical BID   influenza vaccine adjuvanted  0.5 mL Intramuscular Tomorrow-1000   insulin  aspart  0-24 Units Subcutaneous Q4H   linezolid   600 mg Per Tube Q12H   midodrine   15 mg Per Tube TID WC   multivitamin  1 tablet Per Tube QHS   mouth rinse  15 mL Mouth Rinse 4 times per day   sodium chloride  flush  3 mL Intravenous Q12H   Katheryn JAYSON Saba, MD 12/12/2023, 7:09 AM

## 2023-12-12 NOTE — Progress Notes (Addendum)
 Have been able to wean vasopressin  to off today but levophed  around 7-8. Claudene MD notified. Will utilize peripheral IVs for levophed  with SBP goal > 90 and remove HDC. Per IV team, ultrasound-guided IVs and IVWatch no longer required for administration of peripheral vasopressors. Will monitor site closely for infiltration.  Addendum: will keep HDC per Bartle MD for levophed  administration and reassess tomorrow morning. Claudene MD updated of plan.

## 2023-12-12 NOTE — Progress Notes (Signed)
 13 Days Post-Op Procedure(s) (LRB): EXPLORATION POST OPERATIVE OPEN HEART (N/A) Subjective:  No complaints. Sat up in chair. Ate a little but not much appetite.  Objective: Vital signs in last 24 hours: Temp:  [97.5 F (36.4 C)-98.6 F (37 C)] 97.8 F (36.6 C) (01/12 1600) Pulse Rate:  [67-91] 87 (01/12 1715) Cardiac Rhythm: Normal sinus rhythm (01/12 1600) Resp:  [10-29] 29 (01/12 1715) BP: (76-118)/(48-93) 96/60 (01/12 1715) SpO2:  [92 %-100 %] 98 % (01/12 1715) Weight:  [75.3 kg] 75.3 kg (01/12 0500)  Hemodynamic parameters for last 24 hours:    Intake/Output from previous day: 01/11 0701 - 01/12 0700 In: 3035.3 [P.O.:100; I.V.:749; NG/GT:1700; IV Piggyback:486.3] Out: 3082  Intake/Output this shift: Total I/O In: 1060.2 [I.V.:212.9; NG/GT:790; IV Piggyback:57.4] Out: 102   General appearance: alert and cooperative Neurologic: intact Heart: regular rate and rhythm Lungs: diminished breath sounds bibasilar Extremities: no edema Wound: incision healing well  Lab Results: Recent Labs    12/11/23 0106 12/12/23 0215  WBC 23.6* 25.3*  HGB 9.8* 10.4*  HCT 31.2* 32.9*  PLT 164 164   BMET:  Recent Labs    12/11/23 1458 12/12/23 0215  NA 133* 134*  K 5.4* 4.4  CL 97* 97*  CO2 25 25  GLUCOSE 133* 111*  BUN 26* 23  CREATININE 1.52* 1.37*  CALCIUM  8.9 8.9    PT/INR: No results for input(s): LABPROT, INR in the last 72 hours. ABG    Component Value Date/Time   PHART 7.414 12/05/2023 0948   HCO3 23.7 12/05/2023 0948   TCO2 25 12/05/2023 0948   ACIDBASEDEF 1.0 12/05/2023 0948   O2SAT 77.6 12/11/2023 0111   CBG (last 3)  Recent Labs    12/12/23 0739 12/12/23 1116 12/12/23 1548  GLUCAP 155* 144* 126*    Assessment/Plan: POD 13  Hemodynamically stable in sinus rhythm. Still on NE 7, vaso off.  CRRT stopped due to clotted filter. Plan is to remove dialysis catheter but she is still on NE 7 and that is her only central access. Dr. Maryjane will  have to decide if he is comfortable with peripheral NE.  Still getting tube feeds around the clock. Consider night time only to encourage po diet.  Remains afebrile. WBC increasing to 25. On Zosyn  and Zyvox  empirically.   Remains on heparin .   LOS: 13 days    Vanessa [name] 12/12/2023

## 2023-12-12 NOTE — Progress Notes (Signed)
 NAME:  Vanessa [name], MRN:  [id number], DOB:  [birth year], LOS: 13 ADMISSION DATE:  11/29/2023, CONSULTATION DATE:  11/29/23 REFERRING MD:  Dr. Maryjane, CHIEF COMPLAINT:  postop   History of Present Illness:  72yoF with PMH significant for HTN, NICM, HFrEF (EF40-45%), PHTN, CKD and hx of prior severe MR s/p prior mitral clip x 2 in 2020.  Pt with progressive DOE found to have increasing MR  and moderate TR despite adjustments in GDMT.  Not felt to be candidate for additional mitral clip.  Repeat LHC without CAD but showed moderate PHTN.  Underwent mitral valve replacement with tissue valve and tricuspid valve repair by Dr. Maryjane on 12/30.  PCCM consulted to assist with vent and medical management post-operatively in ICU.   Pertinent  Medical History  Never smoker, MR w/ previous Mitral clip x 2 (2020), NICM, HFrEF, PHTN, HTN, CKD3b  Significant Hospital Events: Including procedures, antibiotic start and stop dates in addition to other pertinent events   MVR by Dr. Maryjane; relook for tamponade/washout later in evening without obvious source 1/2 intubated for respiratory decompensation, iNO started. Vanc, meropenem  started. CRRT started.  1/5 extubated 1/8 converted to NSR overnight 1/9 back to A.fib around 0200 1/11 R introducer out  Interim History / Subjective:  No events slept okay Remains on low dose pressors  Objective   Blood pressure (!) 87/56, pulse 78, temperature 98.5 F (36.9 C), temperature source Oral, resp. rate (!) 23, height 5' 3 (1.6 m), weight 75.3 kg, SpO2 92%.        Intake/Output Summary (Last 24 hours) at 12/12/2023 0729 Last data filed at 12/12/2023 0700 Gross per 24 hour  Intake 3035.3 ml  Output 3082 ml  Net -46.7 ml   Filed Weights   12/09/23 0500 12/10/23 0448 12/12/23 0500  Weight: 78.2 kg 77.1 kg 75.3 kg   Examination:  No distress Lungs clear Moves to command Sternotomy looks okay Globally weak Ext warm Aox3  WBC continues to drift  up Pct down  Resolved Hospital Problem list :   Postoperative vent management  Assessment & Plan:  Severe MR (s/p prior mitral clip x 2 in 2020) now s/p tissue MVR Moderate TR s/p TV repair  Moderate PHTN> progressed to severe postop; PA pressures significantly proved with volume removal today NICM, HFrEF, EF 40-45% Post bypass vasoplegia and cardioplegia - ongoing Postop tamponade resolved after relook and washout on day 1 Paroxysmal A-flutter, Afib- now sinus Muscular deconditioning Rising WBC/Pct, new pressor need concerning for uncontrolled sepsis- nontoxic appearing, has multiple lines Postop renal failure on CRRT  - Amio PO - ?stop CRRT and line holiday: will d/w AHF, TCTS and RN to see if we have enough access - Linezolid /zosyn , follow fever curve and WBC - Vaso levo for MAP 65; midodrine  15 TID - IS, chair as tolerated, PT/OT help appreciated - Continue PO + TF - Heparin  gtt - Will follow while in ICU  Best Practice (right click and Reselect all SmartList Selections daily)   Diet/type: TF, regular as ordered; not taking in enough DVT prophylaxis systemic heparin  Pressure ulcer(s): None GI prophylaxis: PPI Lines: HD line Foley:  Yes, and it is still needed Code Status:  full code Last date of multidisciplinary goals of care discussion [per primary]  32 min cc time  Rolan Sharps MD PCCM [facility] For pager details, please see AMION or use Epic chat  After 1900, please call [facility] for cross coverage needs 12/12/2023, 7:29  AM

## 2023-12-12 NOTE — Progress Notes (Signed)
 PHARMACY - ANTICOAGULATION CONSULT NOTE  Pharmacy Consult for IV heparin  Indication: atrial fibrillation  No Known Allergies  Patient Measurements: Height: 5' 3 (160 cm) Weight: 75.3 kg (166 lb 0.1 oz) IBW/kg (Calculated) : 52.4 Heparin  Dosing Weight: ~ 70 kg  Vital Signs: Temp: 97.8 F (36.6 C) (01/12 1600) Temp Source: Oral (01/12 1600) BP: 85/66 (01/12 1800) Pulse Rate: 83 (01/12 1800)  Labs: Recent Labs    12/10/23 0339 12/10/23 1635 12/11/23 0106 12/11/23 1458 12/12/23 0215 12/12/23 1524  HGB 9.6*  --  9.8*  --  10.4*  --   HCT 29.7*  --  31.2*  --  32.9*  --   PLT 191  --  164  --  164  --   APTT 85*  --  106* 61* 91* 110*  HEPARINUNFRC 0.32  --  0.62 0.21* 0.56 0.66  CREATININE 1.39*   < > 1.47* 1.52* 1.37*  --    < > = values in this interval not displayed.    Estimated Creatinine Clearance: 36.1 mL/min (A) (by C-G formula based on SCr of 1.37 mg/dL (H)).   Assessment: 73 yo female s/p scheduled bioprosthetic mitral valve replacement with tissue valve 12/30 now with afib, pharmacy asked to start anticoagulation with IV heparin . Spontaneously converted 1/7; flipped back to AF overnight 1/9 0300. Subsequently given amio bolus & gtt to 60 1/9; converted back to NSR 1/9 1900.  aPTT 110 sec supratherapeutic on 1300 unit/hr.  Heparin  running in PIV now and checking labs from central line.  Checking heparin  levels and aptt with recent elevated Tbili. . CBC stable and no issues with heparin  infusion at this time. Still requiring vasopressors this morning.    Goal of Therapy:  Heparin  level 0.3-0.5 units/ml aPTT 66-85 sec Monitor platelets by anticoagulation protocol: Yes   Plan:  Decrease heparin  to 1100 units/hr  Monitor aPTT, HL, and CBC daily and PRN Monitor for s/sx bleeding    Olam Chalk Pharm.D. CPP, BCPS Clinical Pharmacist [phone] 12/12/2023 6:28 PM

## 2023-12-12 NOTE — Progress Notes (Addendum)
 PHARMACY - ANTICOAGULATION CONSULT NOTE  Pharmacy Consult for IV heparin  Indication: atrial fibrillation  No Known Allergies  Patient Measurements: Height: 5' 3 (160 cm) Weight: 75.3 kg (166 lb 0.1 oz) IBW/kg (Calculated) : 52.4 Heparin  Dosing Weight: ~ 70 kg  Vital Signs: Temp: 98.5 F (36.9 C) (01/12 0400) Temp Source: Oral (01/12 0400) BP: 102/57 (01/12 0600) Pulse Rate: 72 (01/12 0500)  Labs: Recent Labs    12/10/23 0339 12/10/23 1635 12/11/23 0106 12/11/23 1458 12/12/23 0215  HGB 9.6*  --  9.8*  --  10.4*  HCT 29.7*  --  31.2*  --  32.9*  PLT 191  --  164  --  164  APTT 85*  --  106* 61* 91*  HEPARINUNFRC 0.32  --  0.62 0.21* 0.56  CREATININE 1.39*   < > 1.47* 1.52* 1.37*   < > = values in this interval not displayed.    Estimated Creatinine Clearance: 36.1 mL/min (A) (by C-G formula based on SCr of 1.37 mg/dL (H)).   Assessment: 73 yo female s/p scheduled bioprosthetic mitral valve replacement with tissue valve 12/30 now with afib, pharmacy asked to start anticoagulation with IV heparin . Spontaneously converted 1/7; flipped back to AF overnight 1/9 0300. Subsequently given amio bolus & gtt to 60 1/9; converted back to NSR 1/9 1900.  aPTT now supratherapeutic at 91 while on 1350 unit/hr. Still not certain aPTT and heparin  levels are corresponding at this time, so will continue to utilize aPTT levels at this time. CBC stable and no issues with heparin  infusion at this time. Still requiring vasopressors this morning. On 1/11 UFH was infusing peripherally and today UFH is infusing via central line.  Goal of Therapy:  Heparin  level 0.3-0.5 units/ml aPTT 66-85 sec Monitor platelets by anticoagulation protocol: Yes   Plan:  Decrease heparin  to 1300 units/hr  Monitor aPTT and HL in 8 hours Monitor aPTT, HL, and CBC daily and PRN Monitor for s/sx bleeding  Signe Dawn, PharmD PGY2 Cardiology Pharmacy Resident

## 2023-12-13 ENCOUNTER — Inpatient Hospital Stay (HOSPITAL_COMMUNITY): Payer: PPO

## 2023-12-13 DIAGNOSIS — A419 Sepsis, unspecified organism: Secondary | ICD-10-CM | POA: Diagnosis not present

## 2023-12-13 DIAGNOSIS — I272 Pulmonary hypertension, unspecified: Secondary | ICD-10-CM | POA: Diagnosis not present

## 2023-12-13 DIAGNOSIS — R570 Cardiogenic shock: Secondary | ICD-10-CM | POA: Diagnosis not present

## 2023-12-13 DIAGNOSIS — Z952 Presence of prosthetic heart valve: Secondary | ICD-10-CM | POA: Diagnosis not present

## 2023-12-13 DIAGNOSIS — I340 Nonrheumatic mitral (valve) insufficiency: Secondary | ICD-10-CM | POA: Diagnosis not present

## 2023-12-13 DIAGNOSIS — R6521 Severe sepsis with septic shock: Secondary | ICD-10-CM | POA: Diagnosis not present

## 2023-12-13 LAB — CBC
HCT: 33.2 % — ABNORMAL LOW (ref 36.0–46.0)
HCT: 31.6 % — ABNORMAL LOW (ref 36.0–46.0)
Hemoglobin: 10.2 g/dL — ABNORMAL LOW (ref 12.0–15.0)
Hemoglobin: 9.5 g/dL — ABNORMAL LOW (ref 12.0–15.0)
MCH: 29.8 pg (ref 26.0–34.0)
MCH: 30 pg (ref 26.0–34.0)
MCHC: 30.7 g/dL (ref 30.0–36.0)
MCHC: 30.1 g/dL (ref 30.0–36.0)
MCV: 97.1 fL (ref 80.0–100.0)
MCV: 99.7 fL (ref 80.0–100.0)
Platelets: 152 10*3/uL (ref 150–400)
Platelets: 141 10*3/uL — ABNORMAL LOW (ref 150–400)
RBC: 3.42 MIL/uL — ABNORMAL LOW (ref 3.87–5.11)
RBC: 3.17 MIL/uL — ABNORMAL LOW (ref 3.87–5.11)
RDW: 21.7 % — ABNORMAL HIGH (ref 11.5–15.5)
RDW: 21.5 % — ABNORMAL HIGH (ref 11.5–15.5)
WBC: 26.9 10*3/uL — ABNORMAL HIGH (ref 4.0–10.5)
WBC: 29.1 10*3/uL — ABNORMAL HIGH (ref 4.0–10.5)
nRBC: 2.6 % — ABNORMAL HIGH (ref 0.0–0.2)
nRBC: 2.3 % — ABNORMAL HIGH (ref 0.0–0.2)

## 2023-12-13 LAB — GLUCOSE, CAPILLARY
Glucose-Capillary: 128 mg/dL — ABNORMAL HIGH (ref 70–99)
Glucose-Capillary: 130 mg/dL — ABNORMAL HIGH (ref 70–99)
Glucose-Capillary: 139 mg/dL — ABNORMAL HIGH (ref 70–99)
Glucose-Capillary: 139 mg/dL — ABNORMAL HIGH (ref 70–99)
Glucose-Capillary: 143 mg/dL — ABNORMAL HIGH (ref 70–99)

## 2023-12-13 LAB — HEPATIC FUNCTION PANEL
ALT: 44 U/L (ref 0–44)
AST: 39 U/L (ref 15–41)
Albumin: 2.8 g/dL — ABNORMAL LOW (ref 3.5–5.0)
Alkaline Phosphatase: 66 U/L (ref 38–126)
Bilirubin, Direct: 7.8 mg/dL — ABNORMAL HIGH (ref 0.0–0.2)
Indirect Bilirubin: 4.1 mg/dL — ABNORMAL HIGH (ref 0.3–0.9)
Total Bilirubin: 11.9 mg/dL — ABNORMAL HIGH (ref 0.0–1.2)
Total Protein: 7.4 g/dL (ref 6.5–8.1)

## 2023-12-13 LAB — COOXEMETRY PANEL
Carboxyhemoglobin: 2.4 % — ABNORMAL HIGH (ref 0.5–1.5)
Methemoglobin: 0.8 % (ref 0.0–1.5)
O2 Saturation: 73.7 %
Total hemoglobin: 10.5 g/dL — ABNORMAL LOW (ref 12.0–16.0)

## 2023-12-13 LAB — HEPARIN LEVEL (UNFRACTIONATED): Heparin Unfractionated: 0.37 [IU]/mL (ref 0.30–0.70)

## 2023-12-13 LAB — HEPATITIS B SURFACE ANTIGEN: Hepatitis B Surface Ag: NONREACTIVE

## 2023-12-13 LAB — RENAL FUNCTION PANEL
Albumin: 2.7 g/dL — ABNORMAL LOW (ref 3.5–5.0)
Anion gap: 19 — ABNORMAL HIGH (ref 5–15)
BUN: 56 mg/dL — ABNORMAL HIGH (ref 8–23)
CO2: 21 mmol/L — ABNORMAL LOW (ref 22–32)
Calcium: 9.2 mg/dL (ref 8.9–10.3)
Chloride: 91 mmol/L — ABNORMAL LOW (ref 98–111)
Creatinine, Ser: 3.02 mg/dL — ABNORMAL HIGH (ref 0.44–1.00)
GFR, Estimated: 16 mL/min — ABNORMAL LOW (ref >60)
Glucose, Bld: 135 mg/dL — ABNORMAL HIGH (ref 70–99)
Phosphorus: 8.3 mg/dL — ABNORMAL HIGH (ref 2.5–4.6)
Potassium: 5.3 mmol/L — ABNORMAL HIGH (ref 3.5–5.1)
Sodium: 131 mmol/L — ABNORMAL LOW (ref 135–145)

## 2023-12-13 LAB — MAGNESIUM: Magnesium: 2.9 mg/dL — ABNORMAL HIGH (ref 1.7–2.4)

## 2023-12-13 LAB — CORTISOL: Cortisol, Plasma: 21.4 ug/dL

## 2023-12-13 LAB — APTT: aPTT: 72 s — ABNORMAL HIGH (ref 24–36)

## 2023-12-13 MED ORDER — HEPARIN SOD (PORK) LOCK FLUSH 100 UNIT/ML IV SOLN
500.0000 [IU] | Freq: Once | INTRAVENOUS | Status: DC
  Filled 2023-12-13: qty 5

## 2023-12-13 MED ORDER — IOHEXOL 9 MG/ML PO SOLN
500.0000 mL | ORAL | Status: AC
Start: 2023-12-13 — End: 2023-12-13
  Administered 2023-12-13: 500 mL via ORAL

## 2023-12-13 MED ORDER — ALBUMIN HUMAN 25 % IV SOLN
12.5000 g | Freq: Once | INTRAVENOUS | Status: AC
  Administered 2023-12-13: 12.5 g via INTRAVENOUS
  Filled 2023-12-13: qty 50

## 2023-12-13 MED ORDER — IOHEXOL 9 MG/ML PO SOLN: 500.0000 mL | ORAL | Status: AC

## 2023-12-13 MED ORDER — BOOST / RESOURCE BREEZE PO LIQD CUSTOM
1.0000 | Freq: Three times a day (TID) | ORAL | Status: DC
Start: 2023-12-13 — End: 2023-12-17
  Administered 2023-12-14: 1 via ORAL

## 2023-12-13 MED ORDER — LIDOCAINE HCL (PF) 1 % IJ SOLN: 5.0000 mL | INTRAMUSCULAR | Status: DC | PRN

## 2023-12-13 MED ORDER — CHLORHEXIDINE GLUCONATE CLOTH 2 % EX PADS
6.0000 | MEDICATED_PAD | Freq: Every day | CUTANEOUS | Status: DC
  Administered 2023-12-13 – 2023-12-14 (×2): 6 via TOPICAL

## 2023-12-13 MED ORDER — PIPERACILLIN-TAZOBACTAM 3.375 G IVPB
3.3750 g | Freq: Two times a day (BID) | INTRAVENOUS | Status: DC
Start: 2023-12-13 — End: 2023-12-13

## 2023-12-13 MED ORDER — HEPARIN SODIUM (PORCINE) 1000 UNIT/ML DIALYSIS
1000.0000 [IU] | INTRAMUSCULAR | Status: DC | PRN
  Administered 2023-12-13: 2800 [IU]
  Administered 2023-12-31: 3200 [IU]

## 2023-12-13 MED ORDER — ANTICOAGULANT SODIUM CITRATE 4% (200MG/5ML) IV SOLN: 5.0000 mL | Status: DC | PRN

## 2023-12-13 MED ORDER — HEPARIN SODIUM (PORCINE) 1000 UNIT/ML IJ SOLN
2.8000 mL | Freq: Once | INTRAMUSCULAR | Status: AC
  Administered 2023-12-13: 1400 [IU]

## 2023-12-13 MED ORDER — PENTAFLUOROPROP-TETRAFLUOROETH EX AERO
1.0000 | INHALATION_SPRAY | CUTANEOUS | Status: DC | PRN
Start: 2023-12-13 — End: 2024-01-12

## 2023-12-13 MED ORDER — ALTEPLASE 2 MG IJ SOLR
2.0000 mg | Freq: Once | INTRAMUSCULAR | Status: DC | PRN
  Filled 2023-12-13: qty 2

## 2023-12-13 MED ORDER — LIDOCAINE-PRILOCAINE 2.5-2.5 % EX CREA
1.0000 | TOPICAL_CREAM | CUTANEOUS | Status: DC | PRN
Start: 2023-12-13 — End: 2024-01-12

## 2023-12-13 MED ORDER — NOREPINEPHRINE 4 MG/250ML-% IV SOLN
0.0000 ug/min | INTRAVENOUS | Status: DC
  Administered 2023-12-14: 13 ug/min via INTRAVENOUS
  Administered 2023-12-14: 2 ug/min via INTRAVENOUS
  Filled 2023-12-13: qty 250
  Filled 2023-12-13: qty 500
  Filled 2023-12-13: qty 250

## 2023-12-13 MED ORDER — MIDODRINE HCL 5 MG PO TABS
15.0000 mg | ORAL_TABLET | Freq: Three times a day (TID) | ORAL | Status: DC
  Administered 2023-12-13 – 2023-12-21 (×23): 15 mg
  Filled 2023-12-13 (×23): qty 3

## 2023-12-13 MED ORDER — HEPARIN SODIUM (PORCINE) 1000 UNIT/ML IJ SOLN
INTRAMUSCULAR | Status: AC
  Filled 2023-12-13: qty 2

## 2023-12-13 MED ORDER — SODIUM CHLORIDE 0.9 % IV SOLN: 250.0000 mL | INTRAVENOUS | Status: AC

## 2023-12-13 MED ORDER — SODIUM CHLORIDE 0.9 % IV SOLN
1.0000 g | INTRAVENOUS | Status: DC
  Administered 2023-12-13: 1 g via INTRAVENOUS
  Filled 2023-12-13: qty 20

## 2023-12-13 MED ORDER — FUROSEMIDE 10 MG/ML IJ SOLN
120.0000 mg | Freq: Once | INTRAVENOUS | Status: AC
  Administered 2023-12-13: 120 mg via INTRAVENOUS
  Filled 2023-12-13: qty 10

## 2023-12-13 NOTE — Progress Notes (Signed)
 [address]       ,[place] [address]             [phone]      14 Days Post-Op  Procedure(s) (LRB): EXPLORATION POST OPERATIVE OPEN HEART (N/A)   Total Length of Stay:  LOS: 14 days    SUBJECTIVE: Not having much of an appetite today Nausea, no abd pain  Vitals:   12/13/23 0600 12/13/23 0700  BP: 95/61 (!) 94/56  Pulse: 87 82  Resp: (!) 24 (!) 25  Temp:    SpO2: 97% 100%    Intake/Output      01/12 0701 01/13 0700 01/13 0701 01/14 0700   P.O.     I.V. (mL/kg) 476.9 (6.2)    NG/GT 1755    IV Piggyback 165.3    Total Intake(mL/kg) 2397.2 (31.3)    Stool 0    CRRT 102    Total Output 102    Net +2295.2         Stool Occurrence 1 x        sodium chloride  5 mL/hr at 12/12/23 0400   sodium chloride  Stopped (12/11/23 1110)   feeding supplement (VITAL AF 1.2 CAL) 55 mL/hr at 12/13/23 0700   heparin  1,100 Units/hr (12/13/23 0700)   norepinephrine  (LEVOPHED ) Adult infusion 4 mcg/min (12/13/23 0700)   piperacillin -tazobactam (ZOSYN )  IV      CBC    Component Value Date/Time   WBC 26.9 (H) 12/13/2023 0303   RBC 3.42 (L) 12/13/2023 0303   HGB 10.2 (L) 12/13/2023 0303   HCT 33.2 (L) 12/13/2023 0303   PLT 152 12/13/2023 0303   MCV 97.1 12/13/2023 0303   MCH 29.8 12/13/2023 0303   MCHC 30.7 12/13/2023 0303   RDW 21.7 (H) 12/13/2023 0303   LYMPHSABS 2.2 12/09/2023 1001   MONOABS 0.6 12/09/2023 1001   EOSABS 0.0 12/09/2023 1001   BASOSABS 0.2 (H) 12/09/2023 1001   CMP     Component Value Date/Time   NA 131 (L) 12/13/2023 0303   K 5.3 (H) 12/13/2023 0303   CL 91 (L) 12/13/2023 0303   CO2 21 (L) 12/13/2023 0303   GLUCOSE 135 (H) 12/13/2023 0303   BUN 56 (H) 12/13/2023 0303   CREATININE 3.02 (H) 12/13/2023 0303   CREATININE 1.30 (H) 04/29/2016 1144   CALCIUM  9.2 12/13/2023 0303   PROT 7.4 12/13/2023 0303   ALBUMIN  2.7 (L) 12/13/2023 0303   ALBUMIN  2.8 (L) 12/13/2023 0303   AST 39 12/13/2023 0303   ALT 44 12/13/2023 0303    ALKPHOS 66 12/13/2023 0303   BILITOT 11.9 (H) 12/13/2023 0303   GFRNONAA 16 (L) 12/13/2023 0303   GFRAA 42 (L) 08/19/2020 1304   ABG    Component Value Date/Time   PHART 7.414 12/05/2023 0948   PCO2ART 37.0 12/05/2023 0948   PO2ART 124 (H) 12/05/2023 0948   HCO3 23.7 12/05/2023 0948   TCO2 25 12/05/2023 0948   ACIDBASEDEF 1.0 12/05/2023 0948   O2SAT 77.6 12/11/2023 0111   CBG (last 3)  Recent Labs    12/12/23 1548 12/12/23 2003 12/12/23 2310  GLUCAP 126* 100* 148*  Lungs: overall clear Card:RR Ext: warm Incision: intact Neuro: alert and awake   ASSESSMENT: POD #14 sp MV  Hemodynamics seem improving with lower levo requirements  Renal Failure: still has catheter in but was to have holiday due to WBC. Need to discuss if challenge with diuretics and get line out or reinstitute CRRT today WBC:  etiology?. Line holiday? On antibiotics. Blood cultures  CT scan of  chest Nutrition: continue tube feeds but may need night feeds and see if can eat during day PT   Deward Kallman, MD 12/13/2023

## 2023-12-13 NOTE — Progress Notes (Signed)
 Nutrition Follow-up  DOCUMENTATION CODES:   Not applicable  INTERVENTION:   Liberalize diet to Regular to expand food choices in an effort to entice pt to eat. Ok for family to bring in outside food if pt desires; pt currently does not appear interested in eating by mouth or taking supplements  Recommend continuing full TF via Cortrak until pt demonstrating at least some interest in taking po.   Tube Feeding via Cortrak:  Vital AF 1.2 at 55 ml/hr TF at goal rate provides 1584 kcals, 99 g of protein and 1069 mL of free water    Continue Renal MVI daily  Continue Banatrol TF TID  Trial Magic Cup supplement as pt does not seem accepting of Ensure  Trial Boost Breeze as it is juice like instead of milky  NUTRITION DIAGNOSIS:   Inadequate oral intake related to acute illness as evidenced by NPO status.  Being addressed via TF, po diet  GOAL:   Patient will meet greater than or equal to 90% of their needs  Met via TF   MONITOR:   PO intake, Supplement acceptance, TF tolerance, Labs, Weight trends  REASON FOR ASSESSMENT:   Consult Enteral/tube feeding initiation and management, Assessment of nutrition requirement/status (CRRT)  ASSESSMENT:   73 yo admitted with severe MR/TR requiring MVR/TVR complicated by post op bleeding and shock. Pt intubated and CRRT initiated. PMH includes HTN, NICM, HFrEF (EF40-50%), HTN, CKD and hx of prior severe MR s/p mitral clip x 2 in 2020.  12/30 Admitted, severe MR, moderate TR, OR for MVR with mosaic porcine tissue prosthesis, TVR, post op cardiogenic shock, post op bleeding requiring return to OR for washout 12/31 Extubated 01/02 Re-Intubated, iNO started, CRRT initiated 01/03 Cortrak, trickle TF 01/09 US  abdomen with dilated GB, no stones/sludge 01/05 Extubated 01/12 CRRT discontinued, diet advanced to 2g sodium  Remains on levophed  at 3, off vasopressin  Off CRRT, plan for lasix  and iHD today. Creatinine trending back up. No  documented IOP  PO intake remains very minimal. No appetite but also does snot seem interested in eating. Diet advanced from NPO to FL on 1/08 after pt indicating she wanted to eat as she is capable of swallowing. However, once cleared for po diet, pt declined wanting anything to eat. Recorded po intake limited but 0-15% of recorded meals. Ensure ordered but appears pt has refused all but once; Ensure given but unclear if pt consumed.   RN reports plan for CT chest/abdomen/pelvis today; WBC trending up  Current wt 76.5 kg; weight has trended down since admission and is below recent prior to admission wt encounter.s   +stool via rectal tube, bowel regimen prn only  Labs: sodium 131(L), potassium 5.3 (H), phosphorus 8.3 (H), magnesium  2.9 (H) Meds: aranesp , ss novolog , rena-vite  Diet Order:   Diet Order             Diet 2 gram sodium Room service appropriate? Yes; Fluid consistency: Thin  Diet effective now                   EDUCATION NEEDS:   Not appropriate for education at this time  Skin:  Skin Assessment: Skin Integrity Issues: Skin Integrity Issues:: DTI DTI: L Buttocks Incisions: chest post sternotomy (closed)  Last BM:  1/13 type 6 medium, rectal tube has been removed  Height:   Ht Readings from Last 1 Encounters:  12/04/23 5' 3 (1.6 m)    Weight:   Wt Readings from Last 1 Encounters:  12/13/23  76.5 kg    BMI:  Body mass index is 29.88 kg/m.  Estimated Nutritional Needs:   Kcal:  1500-1700 kcals  Protein:  90-115 g  Fluid:  1.5 L   Betsey Finger MS, RDN, LDN, CNSC Registered Dietitian 3 Clinical Nutrition RD Inpatient Contact Info in Amion

## 2023-12-13 NOTE — Progress Notes (Addendum)
 eLink Physician-Brief Progress Note Patient Name: Vanessa [name] DOB: [birth year] MRN: [id number]   Date of Service  12/13/2023  HPI/Events of Note  73 year old female initially presented with mitral valve and tricuspid valve repair complicated by cardiogenic/septic shock in the setting of pulmonary hypertension.  Blood pressure is lower, currently on norepinephrine  and midodrine .  Minimal response to Lasix  challenge.  Now in A-fib without RVR.  Received IHD today.  +1.6 l for the day.  Concerned about pulling HD line with decompensation.  eICU Interventions  Discontinue HD line  Increase ceiling of peripheral vasopressors.  Albumin  25% 12.5 g bolus  Discontinue GI prophylaxis as it is no longer indicated.     Intervention Category Intermediate Interventions: Hypotension - evaluation and management  Rosealee Recinos 12/13/2023, 11:18 PM

## 2023-12-13 NOTE — Progress Notes (Signed)
 Occupational Therapy Treatment Patient Details Name: Vanessa [name] MRN: [id number] DOB: 1950-12-07 Today's Date: 12/13/2023   History of present illness 73 yo F admitted 12/30 with DOE, increasing MR and TR. LCH without CAD. 12/30 MVR and tricuspid valve repair. Intubated 1/2-1/5. 1/2 CRRT started. PMhx: HTN, NICM, HFrEF (EF40-45%), Pulmonary HTN, CKD and  severe MR s/p prior mitral clip x 2 in 2020.   OT comments  Pt progressing toward established OT goals slowly. Pt continues with decreased activity tolerance, and knowledge of precautions. Pt remains agreeable to mobility rials. Pt needing mod A for transfers initially but able to perform second trial with min A and increased cueing for technique. Pt recalling 2/4 precautions this session with examples of functional implementation. Will continue to follow acutely.       If plan is discharge home, recommend the following:  Two people to help with walking and/or transfers;Two people to help with bathing/dressing/bathroom;Assistance with cooking/housework;Assist for transportation;Help with stairs or ramp for entrance   Equipment Recommendations  Other (comment) (TBD)    Recommendations for Other Services Rehab consult    Precautions / Restrictions Precautions Precautions: Sternal;Fall;Other (comment) Precaution Booklet Issued: No Precaution Comments: cortrak, watch vitals Restrictions Weight Bearing Restrictions Per Provider Order: Yes (sternal precautions) Other Position/Activity Restrictions: sternal precautions       Mobility Bed Mobility Overal bed mobility: Needs Assistance Bed Mobility: Rolling, Sit to Sidelying Rolling: Min assist       Sit to sidelying: Mod assist, +2 for physical assistance General bed mobility comments: cues for sequencing    Transfers Overall transfer level: Needs assistance Equipment used: Rolling walker (2 wheels), Ambulation equipment used Transfers: Sit to/from Stand, Bed to  chair/wheelchair/BSC Sit to Stand: Min assist, Mod assist, +2 safety/equipment Stand pivot transfers: Min assist, +2 safety/equipment, +2 physical assistance         General transfer comment: initially mod +2 for rise, progressing to min A with multimodal cues for technique and power up through BLE.     Balance Overall balance assessment: Needs assistance Sitting-balance support: No upper extremity supported, Feet supported Sitting balance-Leahy Scale: Fair     Standing balance support: Bilateral upper extremity supported, Reliant on assistive device for balance Standing balance-Leahy Scale: Poor Standing balance comment: RW in standing and external support with fatigue                           ADL either performed or assessed with clinical judgement   ADL Overall ADL's : Needs assistance/impaired     Grooming: Set up;Sitting Grooming Details (indicate cue type and reason): able to sit EOB ~3 mins             Lower Body Dressing: Total assistance;Bed level Lower Body Dressing Details (indicate cue type and reason): socks Toilet Transfer: Moderate assistance;+2 for safety/equipment;Rolling walker (2 wheels) Toilet Transfer Details (indicate cue type and reason): RN managing IV pole. Mod A to rise 2x; min A for steps using RW           General ADL Comments: focus on transfer training and reinforcement of sternal precautions    Extremity/Trunk Assessment Upper Extremity Assessment Upper Extremity Assessment: Generalized weakness   Lower Extremity Assessment Lower Extremity Assessment: Defer to PT evaluation        Vision       Perception     Praxis      Cognition Arousal: Alert Behavior During Therapy: [facility] for tasks assessed/performed, Flat  affect Overall Cognitive Status: Impaired/Different from baseline Area of Impairment: Memory, Problem solving, Awareness, Safety/judgement                     Memory: Decreased recall of  precautions   Safety/Judgement: Decreased awareness of safety Awareness: Emergent Problem Solving: Slow processing General Comments: able to recall 2/4 precautions this session in her own words with functional description. Min cues for implementation functionally during transfers. Needing mod motivation for transfers and pt refusing to walk; fair awareness of current activity tolerance, but decreased awareness of role of mobility in recovery s/p surgery        Exercises      Shoulder Instructions       General Comments SpO2 as low as 79% with transfer but poor PLETH; no c/o SOB. pt does endorse nausea throughout session and prior to initiation of mobility. BP of 74/51 (59), 79/54 (62) all supported sitting in chair with RN taking these while attempting to get CVP reading; of note receiving LEVO that side. after transfer 96/59 (71) EOB and 94/64 (73) supported in bed with HOB 30 degreees. HR in 80s throughout    Pertinent Vitals/ Pain       Pain Assessment Pain Assessment: Faces Faces Pain Scale: Hurts a little bit Pain Location: chest, BLE Pain Descriptors / Indicators: Sore Pain Intervention(s): Limited activity within patient's tolerance, Monitored during session  Home Living                                          Prior Functioning/Environment              Frequency  Min 1X/week        Progress Toward Goals  OT Goals(current goals can now be found in the care plan section)  Progress towards OT goals: Progressing toward goals  Acute Rehab OT Goals Patient Stated Goal: feel better OT Goal Formulation: With patient Time For Goal Achievement: 12/20/23 Potential to Achieve Goals: Good ADL Goals Pt Will Perform Grooming: with contact guard assist;sitting Pt Will Perform Upper Body Dressing: with set-up;sitting Pt Will Perform Lower Body Dressing: with min assist;sit to/from stand Pt Will Transfer to Toilet: with min assist;ambulating Additional  ADL Goal #1: Pt will identify and implement 2+ energy conservation techniques Additional ADL Goal #2: Pt will recall and implement sternal precautions during ADL  Plan      Co-evaluation                 AM-PAC OT 6 Clicks Daily Activity     Outcome Measure   Help from another person eating meals?: A Little Help from another person taking care of personal grooming?: A Little Help from another person toileting, which includes using toliet, bedpan, or urinal?: A Lot Help from another person bathing (including washing, rinsing, drying)?: A Lot Help from another person to put on and taking off regular upper body clothing?: A Lot Help from another person to put on and taking off regular lower body clothing?: A Lot 6 Click Score: 14    End of Session Equipment Utilized During Treatment: Rolling walker (2 wheels)  OT Visit Diagnosis: Unsteadiness on feet (R26.81);Muscle weakness (generalized) (M62.81)   Activity Tolerance Patient tolerated treatment well   Patient Left in bed;with call bell/phone within reach;with nursing/sitter in room   Nurse Communication Mobility status (RN present)  Time: 9196-9163 OT Time Calculation (min): 33 min  Charges: OT General Charges $OT Visit: 1 Visit OT Treatments $Self Care/Home Management : 8-22 mins $Therapeutic Activity: 8-22 mins  Elma JONETTA Lebron FREDERICK, OTR/L [facility] Acute Rehabilitation Office: [phone]   Elma JONETTA Lebron 12/13/2023, 8:50 AM

## 2023-12-13 NOTE — Progress Notes (Signed)
 PHARMACY - ANTICOAGULATION CONSULT NOTE  Pharmacy Consult for IV heparin  Indication: atrial fibrillation  No Known Allergies  Patient Measurements: Height: 5' 3 (160 cm) Weight: 76.5 kg (168 lb 10.4 oz) IBW/kg (Calculated) : 52.4 Heparin  Dosing Weight: ~ 70 kg  Vital Signs: Temp: 97.7 F (36.5 C) (01/13 0400) Temp Source: Oral (01/13 0400) BP: 90/56 (01/13 0500) Pulse Rate: 82 (01/13 0500)  Labs: Recent Labs    12/11/23 0106 12/11/23 1458 12/12/23 0215 12/12/23 1524 12/13/23 0303  HGB 9.8*  --  10.4*  --  10.2*  HCT 31.2*  --  32.9*  --  33.2*  PLT 164  --  164  --  152  APTT 106* 61* 91* 110* 72*  HEPARINUNFRC 0.62 0.21* 0.56 0.66 0.37  CREATININE 1.47* 1.52* 1.37*  --  3.02*    Estimated Creatinine Clearance: 16.5 mL/min (A) (by C-G formula based on SCr of 3.02 mg/dL (H)).   Assessment: 73 yo female s/p scheduled bioprosthetic mitral valve replacement with tissue valve 12/30 now with afib, pharmacy asked to start anticoagulation with IV heparin . Spontaneously converted 1/7; flipped back to AF overnight 1/9 0300. Subsequently given amio bolus & gtt to 60 1/9; converted back to NSR 1/9 1900.  aPTT 72 sec therapeutic on 1100 unit/hr.  Heparin  running in PIV now and checking labs from central line.  Heparin  level and aPTT correlating, Tbili improved from 13 to 11. CBC stable and no issues with heparin  infusion at this time. Still requiring vasopressors this morning.    Goal of Therapy:  Heparin  level 0.3-0.5 units/ml aPTT 66-85 sec Monitor platelets by anticoagulation protocol: Yes   Plan:  Continue heparin  at 1100 units/hr  Monitor aPTT, HL, and CBC daily and PRN Monitor for s/sx bleeding  Randall Call, PharmD PGY2 Critical Care Pharmacy Resident 12/13/2023 6:23 AM

## 2023-12-13 NOTE — Progress Notes (Addendum)
 [facility] NEPHROLOGY PROGRESS NOTE  Assessment/ Plan: Pt is a 73 y.o. yo female  with a past medical history HTN, NICM, CHF, pulmonary hypertension, mitral and tricuspid valve disease who present w/ surgery for valvular disease c/b shock, AHRF, and AKI   # Oligo-Anuric AKI on CKD 3A: Multifactorial etiology including cardiorenal syndrome and ischemic ATN due to shock.  Started CRRT on 1/2 for fluid overload.  The CRRT was held on 1/12 because of worsening leukocytosis and poor access with plan to have line holiday. The HD catheter is still in due to poor IV access and continue need of pressors.  I have discussed with the multiple teams in ICU today. Order a dose of lasix  today with plan to do intermittent HD. Needs pressor/Levophed  during HD followed by removal of HD catheter.  Probably may not be able to remove fluid because of ongoing hypotension. Continue daily lab and a strict ins and out.   #Multifactorial shock: Vasoplegic and cardioplegia catheter surgery.   - Has been on pressors and inotropes.  Now off milrinone .  Receiving Levophed .     - she is on midodrine   - s/p antibiotics (s/p meropenem  and s/p vanc) and is s/p stress dose steroids.  Resume antibiotics per primary team discretion  - planning for line holiday as above    #Acute exacerbation of heart failure with reduced ejection fraction: Volume overload.  - Optimize volume with renal replacement therapy as above    #Severe MR and tricuspid valvular disease:  - Status post repair.  CT surgery managing   #Postoperative cardiac tamponade:  - Continue monitoring per cardiology   # Hyponatremia: In the setting of AKI, monitor.     #Anemia: Related to blood loss and compounded by AKI and critical illness.    - Transfusions per primary team.   - Started aranesp  40 mcg weekly on Tuesdays   I have discussed with the ICU and dialysis team.   Subjective: Seen and examined in ICU.  Noted the HD line is in place.   Continue to require Levophed  but dose is going down.  Blood pressure in 90s.  No urine output is recorded.  ICU, cardiology and nursing staff were at the bedside. Objective Vital signs in last 24 hours: Vitals:   12/13/23 0400 12/13/23 0500 12/13/23 0600 12/13/23 0700  BP: (!) 92/58 (!) 90/56 95/61 (!) 94/56  Pulse: 82 82 87 82  Resp: 12 13 (!) 24 (!) 25  Temp: 97.7 F (36.5 C)     TempSrc: Oral     SpO2: 100% 99% 97% 100%  Weight:  76.5 kg    Height:       Weight change: 1.2 kg  Intake/Output Summary (Last 24 hours) at 12/13/2023 0743 Last data filed at 12/13/2023 0700 Gross per 24 hour  Intake 2397.2 ml  Output 102 ml  Net 2295.2 ml       Labs: RENAL PANEL Recent Labs  Lab 12/08/23 0338 12/08/23 0339 12/10/23 1635 12/11/23 0106 12/11/23 1458 12/12/23 0215 12/12/23 0939 12/13/23 0303  NA  --    < > 135 134* 133* 134*  --  131*  K  --    < > 4.3 4.9 5.4* 4.4  --  5.3*  CL  --    < > 100 96* 97* 97*  --  91*  CO2  --    < > 25 26 25 25   --  21*  GLUCOSE  --    < >  142* 148* 133* 111*  --  135*  BUN  --    < > 19 26* 26* 23  --  56*  CREATININE  --    < > 1.19* 1.47* 1.52* 1.37*  --  3.02*  CALCIUM   --    < > 8.5* 8.9 8.9 8.9  --  9.2  MG 2.6*  --   --  2.8*  --  2.8*  --  2.9*  PHOS  --    < > 2.5 3.7 3.3 3.6  --  8.3*  ALBUMIN   --    < > 2.9* 3.0* 2.9* 3.0* 2.9* 2.7*  2.8*   < > = values in this interval not displayed.    Liver Function Tests: Recent Labs  Lab 12/10/23 0339 12/10/23 1635 12/12/23 0215 12/12/23 0939 12/13/23 0303  AST 45*  --   --  41 39  ALT 34  --   --  44 44  ALKPHOS 83  --   --  76 66  BILITOT 13.5*  --   --  13.0* 11.9*  PROT 6.9  --   --  7.5 7.4  ALBUMIN  2.9*  2.9*   < > 3.0* 2.9* 2.7*  2.8*   < > = values in this interval not displayed.   No results for input(s): LIPASE, AMYLASE in the last 168 hours. Recent Labs  Lab 12/06/23 0956  AMMONIA <10   CBC: Recent Labs    12/09/23 1001 12/10/23 0339 12/11/23 0106  12/12/23 0215 12/13/23 0303  HGB 9.7* 9.6* 9.8* 10.4* 10.2*  MCV 89.5 91.7 93.7 94.5 97.1    Cardiac Enzymes: No results for input(s): CKTOTAL, CKMB, CKMBINDEX, TROPONINI in the last 168 hours. CBG: Recent Labs  Lab 12/12/23 1116 12/12/23 1548 12/12/23 2003 12/12/23 2310 12/13/23 0738  GLUCAP 144* 126* 100* 148* 139*    Iron  Studies: No results for input(s): IRON , TIBC, TRANSFERRIN, FERRITIN in the last 72 hours. Studies/Results: DG Chest Port 1 View Result Date: 12/11/2023 CLINICAL DATA:  Pneumothorax EXAM: PORTABLE CHEST 1 VIEW COMPARISON:  Same day chest x-ray FINDINGS: Previously seen right central line has been removed. Left IJ central venous catheter is stable in positioning. Enteric tube courses below the diaphragm with distal tip beyond the inferior margin of the film. Stable cardiomegaly status post sternotomy and cardiac valve replacement. No new focal airspace consolidation. No pneumothorax is seen. IMPRESSION: 1. Interval removal of right central line. No pneumothorax. 2. Stable cardiomegaly. Electronically Signed   By: Mabel Converse D.O.   On: 12/11/2023 16:22   DG CHEST PORT 1 VIEW Result Date: 12/11/2023 CLINICAL DATA:  Encounter for central line placement. EXAM: PORTABLE CHEST 1 VIEW COMPARISON:  One-view chest x-ray 12/10/2023 FINDINGS: The heart is enlarged. Atherosclerotic calcifications are present at the aortic arch. Left pleural effusion and retrocardiac airspace disease is again noted. Mild pulmonary vascular congestion is stable. A left IJ line terminates in the innominate vein. Right IJ sheath is stable. A new right subclavian line is in place. The proximal aspect of the line is coiled, presumably outside of the patient. The tip is directed cephalad into the internal jugular vein. No pneumothorax is present. The visualized soft tissues and bony thorax are unremarkable. IMPRESSION: 1. New right subclavian line. The proximal aspect of the line is  coiled, presumably outside of the patient. The tip is directed cephalad into the internal jugular vein. 2. Stable left pleural effusion and retrocardiac airspace disease. 3. Stable cardiomegaly and mild pulmonary vascular  congestion. These results were called by telephone at the time of interpretation on 12/11/2023 at 11:35 am to provider [facility] , who verbally acknowledged these results. Electronically Signed   By: Lonni Necessary M.D.   On: 12/11/2023 12:11   DG Chest 1V REPEAT Same Day Result Date: 12/11/2023 CLINICAL DATA:  Central line placement. EXAM: CHEST - 1 VIEW SAME DAY COMPARISON:  Earlier same day FINDINGS: The tip of the right subclavian central line appears to have been repositioned in the interval. The course of the catheter is atypical in the tip projects over the right lung apex. This could potentially be in the right internal jugular vein. There is some lucency in the right apex without a discrete pleural line visible, similar to yesterday's chest x-ray. Right IJ sheath has been removed in the interval. Left IJ central line tip projects over the proximal SVC level. A feeding tube passes into the stomach although the distal tip position is not included on the film. The cardio pericardial silhouette is enlarged. There is pulmonary vascular congestion without overt pulmonary edema. Retrocardiac left base atelectasis or infiltrate with probable small left effusion is similar to prior. IMPRESSION: 1. Interval repositioning of the right subclavian central line. The course of the catheter is atypical and the tip projects over the right lung apex. This could potentially be in the right internal jugular vein although catheter tip position cannot be definitively confirmed on a single AP projection of the chest. 2. Persistent lucency in the right apex without a discrete pleural line visible to confirm pneumothorax. If there is clinical concern for pneumothorax, right-side-up decubitus film could  be used to further evaluate. 3. No other significant interval change. Electronically Signed   By: Camellia Candle M.D.   On: 12/11/2023 11:34    Medications: Infusions:  sodium chloride  5 mL/hr at 12/12/23 0400   sodium chloride  Stopped (12/11/23 1110)   feeding supplement (VITAL AF 1.2 CAL) 55 mL/hr at 12/13/23 0700   furosemide      heparin  1,100 Units/hr (12/13/23 0700)   norepinephrine  (LEVOPHED ) Adult infusion 4 mcg/min (12/13/23 0700)   piperacillin -tazobactam (ZOSYN )  IV      Scheduled Medications:  amiodarone   400 mg Per Tube BID   Chlorhexidine  Gluconate Cloth  6 each Topical Daily   Chlorhexidine  Gluconate Cloth  6 each Topical Q0600   darbepoetin (ARANESP ) injection - NON-DIALYSIS  40 mcg Subcutaneous Q Tue-1800   famotidine   20 mg Per Tube Daily   feeding supplement  237 mL Oral BID BM   fiber supplement (BANATROL TF)  60 mL Per Tube TID   Gerhardt's butt cream   Topical BID   influenza vaccine adjuvanted  0.5 mL Intramuscular Tomorrow-1000   insulin  aspart  0-24 Units Subcutaneous Q4H   linezolid   600 mg Per Tube Q12H   midodrine   15 mg Per Tube TID WC   multivitamin  1 tablet Per Tube QHS   mouth rinse  15 mL Mouth Rinse 4 times per day   sodium chloride  flush  3 mL Intravenous Q12H    have reviewed scheduled and prn medications.  Physical Exam: General:NAD, comfortable Heart:RRR, s1s2 nl Lungs:clear b/l, no crackle Abdomen:soft, Non-tender, non-distended Extremities:No edema Dialysis Access: Left IJ none tunneled HD catheter in place  Nitin Mckowen Prasad Marqual Mi 12/13/2023,7:43 AM  LOS: 14 days

## 2023-12-13 NOTE — Progress Notes (Signed)
 NAME:  Vanessa [name], MRN:  [id number], DOB:  March 28, 1951, LOS: 14 ADMISSION DATE:  11/29/2023, CONSULTATION DATE:  11/29/23 REFERRING MD:  Dr. Maryjane, CHIEF COMPLAINT:  postop   History of Present Illness:  72yoF with PMH significant for HTN, NICM, HFrEF (EF40-45%), PHTN, CKD and hx of prior severe MR s/p prior mitral clip x 2 in 2020.  Pt with progressive DOE found to have increasing MR  and moderate TR despite adjustments in GDMT.  Not felt to be candidate for additional mitral clip.  Repeat LHC without CAD but showed moderate PHTN.  Underwent mitral valve replacement with tissue valve and tricuspid valve repair by Dr. Maryjane on 12/30.  PCCM consulted to assist with vent and medical management post-operatively in ICU.   Pertinent  Medical History  Never smoker, MR w/ previous Mitral clip x 2 (2020), NICM, HFrEF, PHTN, HTN, CKD3b  Significant Hospital Events: Including procedures, antibiotic start and stop dates in addition to other pertinent events   MVR by Dr. Maryjane; relook for tamponade/washout later in evening without obvious source 1/2 intubated for respiratory decompensation, iNO started. Vanc, meropenem  started. CRRT started.  1/5 extubated 1/8 converted to NSR overnight 1/9 back to A.fib around 0200 1/11 R introducer out.  Unable to thread right subclavian catheter 1/12 remains weak still pressor dependent  Interim History / Subjective:  She is still on low-dose norepinephrine .  Had episode of worsening hypotension when getting back into chair today  Objective   Blood pressure (!) 94/56, pulse 82, temperature 97.7 F (36.5 C), temperature source Oral, resp. rate (!) 25, height 5' 3 (1.6 m), weight 76.5 kg, SpO2 100%.        Intake/Output Summary (Last 24 hours) at 12/13/2023 0740 Last data filed at 12/13/2023 0700 Gross per 24 hour  Intake 2397.2 ml  Output 102 ml  Net 2295.2 ml   Filed Weights   12/10/23 0448 12/12/23 0500 12/13/23 0500  Weight: 77.1 kg 75.3 kg  76.5 kg   Examination:  General This is a pleasant 73 year old female patient is currently sitting up in bed and in no acute distress however remains debilitated and weak from prolonged critical illness HEENT normocephalic atraumatic mucous membranes are moist, her sclera are very icteric.  Left supraclavicular HD catheter with bloody drainage at insertion site otherwise unremarkable Pulmonary clear diminished bases currently on nasal cannula without accessory use Cardiac regular rate and rhythm, sternal incision clean dry and intact well-approximated and healing well Abdomen soft not tender Extremities are warm and dry with brisk capillary refill she still has some pitting edema Neuro awake oriented no focal deficits  Resolved Hospital Problem list :   Postoperative vent management Postop tamponade resolved after relook and washout on day 1 Assessment & Plan:  Severe MR (s/p prior mitral clip x 2 in 2020) now s/p tissue MVR Moderate TR s/p TV repair c/b Moderate PHTN> progressed to severe postop; PA pressures significantly improved Plan Cont post op care per CVTS Mobilize, IS, PT. OT  Pain ctl  Hemodynamics remain barrier to progression  Glycemic control  Post bypass vasoplegia and cardioplegia - complicated by sepsis/septic shock c/b NICM, HFrEF, EF 40-45% -source not clear, afebrile, but WBC cont to climb. No culture data available. PCT on 1/12 elevated but <2 so not super specific  Was on stress dose steroids from 1/3 to 1/6  Plan Day 3 of x zyvox  and zosyn   Consider CT of chest abdomen pelvis evaluating for occult explanation of ongoing leukocytosis Keep  euvolemic Cont midodrine  at 15mg  tid Cont NE for MAP > 65 For iHD today then line holiday   Paroxysmal A-flutter, Afib- now sinus Plan Cont systemic heparin   Cont amiodarone   Cont tele   Postop renal failure w/ worsening hyperkalemia and AGMA 1/13 Off CRRT yesterday scr doubled Plan iHD today  F/u chems   Muscular  deconditioning and protein calorie malnutrition Plan Cont PT Cont TF and POs  Elevated t bili.  Slowly improving. US  abd neg, suspect this was passive congestion related from her RV dysfunction Plan Monitor   Difficult IV access Reviewed Dr Vernon notes from 1/11. Not able to thread right [place] line. Did have right internal jugular before which was removed on 8th Plan After review of notes looks like the Right internal jugular site could be re-considered if needed  Best Practice (right click and Reselect all SmartList Selections daily)   Diet/type: TF, regular as ordered; not taking in enough DVT prophylaxis systemic heparin  Pressure ulcer(s): None GI prophylaxis: PPI Lines: HD line Foley:  Yes, and it is still needed Code Status:  full code Last date of multidisciplinary goals of care discussion [per primary]  My critical care time is 33 minutes

## 2023-12-13 NOTE — Progress Notes (Signed)
 Patient ID: Vanessa [name], female   DOB: 27-Jan-1951, 73 y.o.   MRN: [id number]      Advanced Heart Failure Rounding Note  Cardiologist: Dr. Rolan  Chief Complaint: Cardiogenic shock  Subjective:   S/P MVR (tissue) and TV ring and same day return to OR for tamponade/bleeding. 1/2: Progressive volume overload with poor UOP, CVVH started but ended up re-intubated.   Extubated 12/05/23 1/12: CVVH stopped  This morning she is off vasopressin  and on NE 3.  On midodrine  15 tid. Weight up 2 lbs.   On linezolid /vancomycin , WBCs 27 => 25.   Tbili 13 => 11.9.    Objective:   Weight Range: 76.5 kg Body mass index is 29.88 kg/m.   Vital Signs:   Temp:  [97.5 F (36.4 C)-98 F (36.7 C)] 97.7 F (36.5 C) (01/13 0400) Pulse Rate:  [71-91] 82 (01/13 0700) Resp:  [12-29] 25 (01/13 0700) BP: (82-118)/(48-93) 94/56 (01/13 0700) SpO2:  [94 %-100 %] 100 % (01/13 0700) Weight:  [76.5 kg] 76.5 kg (01/13 0500) Last BM Date : 12/10/23  Weight change: Filed Weights   12/10/23 0448 12/12/23 0500 12/13/23 0500  Weight: 77.1 kg 75.3 kg 76.5 kg   Intake/Output:   Intake/Output Summary (Last 24 hours) at 12/13/2023 0738 Last data filed at 12/13/2023 0700 Gross per 24 hour  Intake 2397.2 ml  Output 102 ml  Net 2295.2 ml    Physical Exam   General: NAD, frail Neck: No JVD, no thyromegaly or thyroid  nodule.  Lungs: Clear to auscultation bilaterally with normal respiratory effort. CV: Nondisplaced PMI.  Heart regular S1/S2, no S3/S4, no murmur.  No peripheral edema.   Abdomen: Soft, nontender, no hepatosplenomegaly, no distention.  Skin: Intact without lesions or rashes.  Neurologic: Alert and oriented x 3.  Psych: Normal affect. Extremities: No clubbing or cyanosis.  HEENT: Normal.   Telemetry   NSR  Labs    CBC Recent Labs    12/12/23 0215 12/13/23 0303  WBC 25.3* 26.9*  HGB 10.4* 10.2*  HCT 32.9* 33.2*  MCV 94.5 97.1  PLT 164 152   Basic Metabolic Panel Recent Labs     12/12/23 0215 12/13/23 0303  NA 134* 131*  K 4.4 5.3*  CL 97* 91*  CO2 25 21*  GLUCOSE 111* 135*  BUN 23 56*  CREATININE 1.37* 3.02*  CALCIUM  8.9 9.2  MG 2.8* 2.9*  PHOS 3.6 8.3*   Liver Function Tests Recent Labs    12/12/23 0939 12/13/23 0303  AST 41 39  ALT 44 44  ALKPHOS 76 66  BILITOT 13.0* 11.9*  PROT 7.5 7.4  ALBUMIN  2.9* 2.7*  2.8*   No results for input(s): LIPASE, AMYLASE in the last 72 hours. Cardiac Enzymes No results for input(s): CKTOTAL, CKMB, CKMBINDEX, TROPONINI in the last 72 hours.  BNP: BNP (last 3 results) Recent Labs    05/26/23 1610 08/26/23 1103 09/22/23 0905  BNP 778.0* 781.0* 926.0*   Other results:  Imaging   No results found.      Medications:   Scheduled Medications:  amiodarone   400 mg Per Tube BID   Chlorhexidine  Gluconate Cloth  6 each Topical Daily   darbepoetin (ARANESP ) injection - NON-DIALYSIS  40 mcg Subcutaneous Q Tue-1800   famotidine   20 mg Per Tube Daily   feeding supplement  237 mL Oral BID BM   fiber supplement (BANATROL TF)  60 mL Per Tube TID   Gerhardt's butt cream   Topical BID   influenza vaccine  adjuvanted  0.5 mL Intramuscular Tomorrow-1000   insulin  aspart  0-24 Units Subcutaneous Q4H   linezolid   600 mg Per Tube Q12H   midodrine   15 mg Per Tube TID WC   multivitamin  1 tablet Per Tube QHS   mouth rinse  15 mL Mouth Rinse 4 times per day   sodium chloride  flush  3 mL Intravenous Q12H    Infusions:  sodium chloride  5 mL/hr at 12/12/23 0400   sodium chloride  Stopped (12/11/23 1110)   feeding supplement (VITAL AF 1.2 CAL) 55 mL/hr at 12/13/23 0700   furosemide      heparin  1,100 Units/hr (12/13/23 0700)   norepinephrine  (LEVOPHED ) Adult infusion 4 mcg/min (12/13/23 0700)   piperacillin -tazobactam (ZOSYN )  IV      PRN Medications: sodium chloride , sodium chloride , artificial tears, bisacodyl  **OR** bisacodyl , docusate, HYDROmorphone  (DILAUDID ) injection, levalbuterol , ondansetron   (ZOFRAN ) IV, mouth rinse, oxyCODONE , pneumococcal 20-valent conjugate vaccine, sodium chloride  flush, traZODone   Patient Profile   Vanessa [name] is a 73 y.o. with history of nonischemic cardiomyopathy and severe MR s/p Mitral Clip x2 (2020), pHTN, and CKD IIIb.   Assessment/Plan   1. Post Cardiotomy Cardiogenic & septic shock: Bioprosthetic MVR and tricuspid ring 11/29/23 with Dr. Maryjane. Pre-op  cath with no significant CAD.  Pre-op  echo with EF 40-45%, severe MR. Went back to the OR with post-op tamponade for clean-out.  Echo 12/31 showed EF 25-30%, moderate RV dysfunction, s/p bioprosthetic mitral valve replacement with trivial MR and tricuspid valve repair with trivial TR. Worsened on 1/2 and re-intubated in setting of volume overload, oliguric renal failure &  pulmonary hypertension. Extubated on 12/04/22. Progressive renal failure requiring CVVH for volume removal.  Now off CVVH on 1/12.  Have also had concern for septic shock component and on abx with markedly elevated WBCs and high PCT. She does not look significantly volume overloaded today, she remains on NE 3 and off vasopressin .  - Continue midodrine  15 tid.  - Will plan Lasix  120 mg IV x 1 challenge today and iHD later today.  - As long as we can keep NE at minimal dose, will remove HD catheter after HD for 48 hr line holiday.  - Vascular access will be difficult, unable to get line in right subclavian area earlier, may need eventual IR placement.  2. Mitral regurgitation:  TEE in 11/19 with severe MR, restricted posterior leaflet.  She had Mitraclip x 2 in 1/20. Post-op echo showed mild MR and mild mitral stenosis.   Echo in 9/24 showed MV s/p Mitraclip x 2 with mean gradient 6 mmHg and severe mitral regurgitation. TEE was then done to evaluate, showing 2 Mitraclips in place with mean gradient 4 mmHg and severe eccentric MR from between the 2 clips with ERO 0.54 cm^2.  She was not thought to be a candidate for an additional Mitraclip due  to lack of room between the two existing clips. R/LHC (11/24) showed moderate mixed pulmonary arterial and pulmonary venous hypertension likely due to mitral valve regurgitation.  S/p MV replacement and Tricuspid repair on 11/29/23 with Dr. Maryjane.  - Will get repeat echo later this week.  3. Ventricular ectopy: NSR on telemetry.  - Amio 400 BID.  4. Pulmonary hypertension: Most recent pre-op  RHC (11/24) with PVR 4.5, with moderate mixed pulmonary arterial and pulmonary venous hypertension likely due to mitral valve regurgitation. Now s/p MVR, PA pressure severely elevated post-op.  Suspect still mixed PAH/PVH with elevated PCWP.  NO was tried at 10 ppm but  did not have much effect so now off.  Will hold off on pulmonary vasodilators; small clinical trials demonstrate no improvement and some markers of harm with PDE5i in PH in the setting of mitral valve disease.  5. Toxic multinodular goiter: She is now off methimazole . TSH was normal in 8/24. Follows with Endocrinology 6. AKI on CKD stage 3: CVVH begun post-op.  - Challenge with Lasix  120 mg IV x 1 - HD later today.  Will remove HD catheter afterwards for line holiday.  7. Blood loss anemia - Hgb stable.  8. Complete heart block: She had post-op CHB. Resolved. NSR today.  9. Hypoxic / Hypercapnic respiratory failure: Resolved.  - stable on [place] 10. Atrial fibrillation: Patient previously in AF with controlled rate. NSR today.  - Amiodarone  400 bid.  11. ID: Completed 7 day course of vanc/merrem  with no obvious source of infection. 1/9 abdominal US  no evidence for cholecystitis.  WBCs 27 => 25.  - On linezolid /Zosyn .  - Plan for line holiday, remove HD catheter after HD today.  12. Thrombocytopenia: Resolved.  13. Deconditioning - will need extensive PT.  14. FEN: Getting tube feeds  Ezra Shuck 12/13/2023 7:38 AM  CRITICAL CARE Performed by: Ezra Shuck   Total critical care time: 40  Critical care time was exclusive of  separately billable procedures and treating other patients.  Critical care was necessary to treat or prevent imminent or life-threatening deterioration.  Critical care was time spent personally by me on the following activities: development of treatment plan with patient and/or surrogate as well as nursing, discussions with consultants, evaluation of patient's response to treatment, examination of patient, obtaining history from patient or surrogate, ordering and performing treatments and interventions, ordering and review of laboratory studies, ordering and review of radiographic studies, pulse oximetry and re-evaluation of patient's condition.

## 2023-12-13 NOTE — Plan of Care (Signed)
   Problem: Education: Goal: Knowledge of General Education information will improve Description Including pain rating scale, medication(s)/side effects and non-pharmacologic comfort measures Outcome: Progressing   Problem: Health Behavior/Discharge Planning: Goal: Ability to manage health-related needs will improve Outcome: Progressing

## 2023-12-13 NOTE — Progress Notes (Signed)
   12/13/23 1957  Vitals  Temp 98.5 F (36.9 C)  Pulse Rate 94  Resp (!) 22  BP 106/60  SpO2 91 %  O2 Device Nasal Cannula  Oxygen Therapy  O2 Flow Rate (L/min) 3 L/min  Patient Activity (if Appropriate) In bed  Pulse Oximetry Type Continuous  Oximetry Probe Site Changed No  Post Treatment  Dialyzer Clearance Clear  Hemodialysis Intake (mL) 0 mL  Liters Processed 62  Fluid Removed (mL) 200 mL  Tolerated HD Treatment Yes   Tx ia pte bedside---2H02 Alert and oriented.  Informed consent signed and in chart.   TX duration:3.0  Patient tolerated well. ---remains on Levophed  iv gtt at 9--- Alert, without acute distress.  Hand-off given to patient's nurse.   Access used: [facility] HD catheter Access issues: none  Total UF removed: 200cc Medication(s) given: none   [name]

## 2023-12-14 ENCOUNTER — Inpatient Hospital Stay (HOSPITAL_COMMUNITY): Payer: PPO

## 2023-12-14 DIAGNOSIS — I3139 Other pericardial effusion (noninflammatory): Secondary | ICD-10-CM

## 2023-12-14 DIAGNOSIS — I340 Nonrheumatic mitral (valve) insufficiency: Secondary | ICD-10-CM | POA: Diagnosis not present

## 2023-12-14 DIAGNOSIS — A419 Sepsis, unspecified organism: Secondary | ICD-10-CM | POA: Diagnosis not present

## 2023-12-14 DIAGNOSIS — I272 Pulmonary hypertension, unspecified: Secondary | ICD-10-CM | POA: Diagnosis not present

## 2023-12-14 DIAGNOSIS — R570 Cardiogenic shock: Secondary | ICD-10-CM | POA: Diagnosis not present

## 2023-12-14 DIAGNOSIS — Z952 Presence of prosthetic heart valve: Secondary | ICD-10-CM | POA: Diagnosis not present

## 2023-12-14 DIAGNOSIS — R6521 Severe sepsis with septic shock: Secondary | ICD-10-CM | POA: Diagnosis not present

## 2023-12-14 LAB — CBC
HCT: 29.3 % — ABNORMAL LOW (ref 36.0–46.0)
Hemoglobin: 9.2 g/dL — ABNORMAL LOW (ref 12.0–15.0)
MCH: 30.3 pg (ref 26.0–34.0)
MCHC: 31.4 g/dL (ref 30.0–36.0)
MCV: 96.4 fL (ref 80.0–100.0)
Platelets: 126 10*3/uL — ABNORMAL LOW (ref 150–400)
RBC: 3.04 MIL/uL — ABNORMAL LOW (ref 3.87–5.11)
RDW: 21.4 % — ABNORMAL HIGH (ref 11.5–15.5)
WBC: 23.3 10*3/uL — ABNORMAL HIGH (ref 4.0–10.5)
nRBC: 1.3 % — ABNORMAL HIGH (ref 0.0–0.2)

## 2023-12-14 LAB — ECHOCARDIOGRAM COMPLETE
AR max vel: 0.86 cm2
AV Area VTI: 0.76 cm2
AV Area mean vel: 0.86 cm2
AV Mean grad: 16 mm[Hg]
AV Peak grad: 29.4 mm[Hg]
Ao pk vel: 2.71 m/s
Area-P 1/2: 2.07 cm2
Height: 63 in
MV VTI: 0.82 cm2
S' Lateral: 3.8 cm
Weight: 2903.02 [oz_av]

## 2023-12-14 LAB — COOXEMETRY PANEL
Carboxyhemoglobin: 2.3 % — ABNORMAL HIGH (ref 0.5–1.5)
Methemoglobin: <0.7 % (ref 0.0–1.5)
O2 Saturation: 72.8 %
Total hemoglobin: 9.3 g/dL — ABNORMAL LOW (ref 12.0–16.0)

## 2023-12-14 LAB — RENAL FUNCTION PANEL
Albumin: 2.6 g/dL — ABNORMAL LOW (ref 3.5–5.0)
Albumin: 2.5 g/dL — ABNORMAL LOW (ref 3.5–5.0)
Albumin: 2.5 g/dL — ABNORMAL LOW (ref 3.5–5.0)
Anion gap: 13 (ref 5–15)
Anion gap: 16 — ABNORMAL HIGH (ref 5–15)
Anion gap: 10 (ref 5–15)
BUN: 36 mg/dL — ABNORMAL HIGH (ref 8–23)
BUN: 46 mg/dL — ABNORMAL HIGH (ref 8–23)
BUN: 36 mg/dL — ABNORMAL HIGH (ref 8–23)
CO2: 26 mmol/L (ref 22–32)
CO2: 24 mmol/L (ref 22–32)
CO2: 25 mmol/L (ref 22–32)
Calcium: 8.6 mg/dL — ABNORMAL LOW (ref 8.9–10.3)
Calcium: 8.6 mg/dL — ABNORMAL LOW (ref 8.9–10.3)
Calcium: 8.3 mg/dL — ABNORMAL LOW (ref 8.9–10.3)
Chloride: 92 mmol/L — ABNORMAL LOW (ref 98–111)
Chloride: 89 mmol/L — ABNORMAL LOW (ref 98–111)
Chloride: 95 mmol/L — ABNORMAL LOW (ref 98–111)
Creatinine, Ser: 2.5 mg/dL — ABNORMAL HIGH (ref 0.44–1.00)
Creatinine, Ser: 2.79 mg/dL — ABNORMAL HIGH (ref 0.44–1.00)
Creatinine, Ser: 2.27 mg/dL — ABNORMAL HIGH (ref 0.44–1.00)
GFR, Estimated: 20 mL/min — ABNORMAL LOW (ref >60)
GFR, Estimated: 17 mL/min — ABNORMAL LOW (ref >60)
GFR, Estimated: 22 mL/min — ABNORMAL LOW (ref >60)
Glucose, Bld: 124 mg/dL — ABNORMAL HIGH (ref 70–99)
Glucose, Bld: 155 mg/dL — ABNORMAL HIGH (ref 70–99)
Glucose, Bld: 147 mg/dL — ABNORMAL HIGH (ref 70–99)
Phosphorus: 5.3 mg/dL — ABNORMAL HIGH (ref 2.5–4.6)
Phosphorus: 6.8 mg/dL — ABNORMAL HIGH (ref 2.5–4.6)
Phosphorus: 5 mg/dL — ABNORMAL HIGH (ref 2.5–4.6)
Potassium: 4.2 mmol/L (ref 3.5–5.1)
Potassium: 5.2 mmol/L — ABNORMAL HIGH (ref 3.5–5.1)
Potassium: 4.8 mmol/L (ref 3.5–5.1)
Sodium: 131 mmol/L — ABNORMAL LOW (ref 135–145)
Sodium: 129 mmol/L — ABNORMAL LOW (ref 135–145)
Sodium: 130 mmol/L — ABNORMAL LOW (ref 135–145)

## 2023-12-14 LAB — GLUCOSE, CAPILLARY
Glucose-Capillary: 112 mg/dL — ABNORMAL HIGH (ref 70–99)
Glucose-Capillary: 119 mg/dL — ABNORMAL HIGH (ref 70–99)
Glucose-Capillary: 125 mg/dL — ABNORMAL HIGH (ref 70–99)
Glucose-Capillary: 134 mg/dL — ABNORMAL HIGH (ref 70–99)
Glucose-Capillary: 140 mg/dL — ABNORMAL HIGH (ref 70–99)
Glucose-Capillary: 151 mg/dL — ABNORMAL HIGH (ref 70–99)

## 2023-12-14 LAB — APTT: aPTT: 82 s — ABNORMAL HIGH (ref 24–36)

## 2023-12-14 LAB — MAGNESIUM: Magnesium: 2.2 mg/dL (ref 1.7–2.4)

## 2023-12-14 LAB — HEPARIN LEVEL (UNFRACTIONATED): Heparin Unfractionated: 0.39 [IU]/mL (ref 0.30–0.70)

## 2023-12-14 LAB — HEPATITIS B SURFACE ANTIBODY, QUANTITATIVE: Hep B S AB Quant (Post): 20.8 m[IU]/mL

## 2023-12-14 MED ORDER — AMIODARONE HCL IN DEXTROSE 360-4.14 MG/200ML-% IV SOLN
30.0000 mg/h | INTRAVENOUS | Status: DC
  Administered 2023-12-14 – 2023-12-18 (×9): 30 mg/h via INTRAVENOUS
  Filled 2023-12-14 (×9): qty 200

## 2023-12-14 MED ORDER — AMIODARONE HCL IN DEXTROSE 360-4.14 MG/200ML-% IV SOLN: 30.0000 mg/h | INTRAVENOUS | Status: DC

## 2023-12-14 MED ORDER — NOREPINEPHRINE 16 MG/250ML-% IV SOLN
0.0000 ug/min | INTRAVENOUS | Status: DC
  Administered 2023-12-14: 2 ug/min via INTRAVENOUS
  Administered 2023-12-15: 18 ug/min via INTRAVENOUS
  Administered 2023-12-17: 8 ug/min via INTRAVENOUS
  Administered 2023-12-18: 10 ug/min via INTRAVENOUS
  Administered 2023-12-19: 7 ug/min via INTRAVENOUS
  Administered 2023-12-20: 11 ug/min via INTRAVENOUS
  Administered 2023-12-21: 14 ug/min via INTRAVENOUS
  Administered 2023-12-22: 8 ug/min via INTRAVENOUS
  Administered 2023-12-23: 5 ug/min via INTRAVENOUS
  Administered 2023-12-25: 2 ug/min via INTRAVENOUS
  Filled 2023-12-14 (×11): qty 250

## 2023-12-14 MED ORDER — RENA-VITE PO TABS
1.0000 | ORAL_TABLET | Freq: Two times a day (BID) | ORAL | Status: DC
  Administered 2023-12-14 – 2023-12-29 (×30): 1
  Filled 2023-12-14 (×31): qty 1

## 2023-12-14 MED ORDER — SODIUM CHLORIDE 0.9 % FOR CRRT: INTRAVENOUS_CENTRAL | Status: DC | PRN

## 2023-12-14 MED ORDER — PRISMASOL BGK 4/2.5 32-4-2.5 MEQ/L EC SOLN: Status: DC

## 2023-12-14 MED ORDER — HEPARIN SODIUM (PORCINE) 1000 UNIT/ML DIALYSIS
1000.0000 [IU] | INTRAMUSCULAR | Status: DC | PRN
  Administered 2023-12-17: 2400 [IU] via INTRAVENOUS_CENTRAL
  Administered 2023-12-18: 2000 [IU] via INTRAVENOUS_CENTRAL
  Administered 2023-12-19: 2400 [IU] via INTRAVENOUS_CENTRAL
  Filled 2023-12-14: qty 6
  Filled 2023-12-14: qty 2
  Filled 2023-12-14 (×3): qty 6
  Filled 2023-12-14 (×2): qty 3
  Filled 2023-12-14: qty 2

## 2023-12-14 MED ORDER — PRISMASOL BGK 4/2.5 32-4-2.5 MEQ/L REPLACEMENT SOLN: Status: DC

## 2023-12-14 MED ORDER — PERFLUTREN LIPID MICROSPHERE
1.0000 mL | INTRAVENOUS | Status: AC | PRN
  Administered 2023-12-14: 5 mL via INTRAVENOUS

## 2023-12-14 MED ORDER — SODIUM CHLORIDE 0.9 % IV SOLN
1.0000 g | Freq: Three times a day (TID) | INTRAVENOUS | Status: DC
  Administered 2023-12-14 – 2023-12-19 (×16): 1 g via INTRAVENOUS
  Filled 2023-12-14 (×16): qty 20

## 2023-12-14 NOTE — Progress Notes (Signed)
 PHARMACY - ANTICOAGULATION CONSULT NOTE  Pharmacy Consult for IV heparin  Indication: atrial fibrillation  No Known Allergies  Patient Measurements: Height: 5' 3 (160 cm) Weight: 82.3 kg (181 lb 7 oz) IBW/kg (Calculated) : 52.4 Heparin  Dosing Weight: ~ 70 kg  Vital Signs: Temp: 98.4 F (36.9 C) (01/14 0345) Temp Source: Oral (01/14 0345) BP: 84/48 (01/14 0600) Pulse Rate: 89 (01/14 0600)  Labs: Recent Labs    12/12/23 0215 12/12/23 1524 12/13/23 0303 12/13/23 1048 12/14/23 0240  HGB 10.4*  --  10.2* 9.5* 9.2*  HCT 32.9*  --  33.2* 31.6* 29.3*  PLT 164  --  152 141* 126*  APTT 91* 110* 72*  --  82*  HEPARINUNFRC 0.56 0.66 0.37  --  0.39  CREATININE 1.37*  --  3.02*  --  2.50*    Estimated Creatinine Clearance: 20.7 mL/min (A) (by C-G formula based on SCr of 2.5 mg/dL (H)).   Assessment: 73 yo female s/p scheduled bioprosthetic mitral valve replacement with tissue valve 12/30 now with afib, pharmacy asked to start anticoagulation with IV heparin . Spontaneously converted 1/7; flipped back to AF overnight 1/9 0300. Subsequently given amio bolus & gtt to 60 1/9; converted back to NSR 1/9 1900.  aPTT 82 sec therapeutic on 1100 unit/hr.  Heparin  running in PIV now, plans for new HD cath placement ongoing. Tbili improved from 13 to 11. Will continue to utilize aPTT given unexpected heparin  levels 1/12. CBC stable and no issues with heparin  infusion at this time per RN. Still requiring vasopressors this morning for RRT.  Goal of Therapy:  Heparin  level 0.3-0.5 units/ml aPTT 66-85 sec Monitor platelets by anticoagulation protocol: Yes   Plan:  Continue heparin  at 1100 units/hr  Monitor aPTT, HL, and CBC daily and PRN Monitor for s/sx bleeding Anticipate initiation of CVVHD today 1/14  Randall Call, PharmD PGY2 Critical Care Pharmacy Resident 12/14/2023 6:32 AM

## 2023-12-14 NOTE — Progress Notes (Signed)
 Echocardiogram 2D Echocardiogram has been performed.  Warren Lacy Christella App RDCS 12/14/2023, 9:35 AM

## 2023-12-14 NOTE — Procedures (Signed)
 Central Venous Catheter Insertion Procedure Note  Vanessa [name]  [id number]  [birth year]  Date:12/14/23  Time:10:09 AM   Provider Performing:Pete FORBES Jenna   Procedure: Insertion of Non-tunneled Central Venous Catheter(36556)with US  guidance (23062)    Indication(s) Medication administration, Difficult access, and Hemodialysis  Consent Risks of the procedure as well as the alternatives and risks of each were explained to the patient and/or caregiver.  Consent for the procedure was obtained and is signed in the bedside chart  Anesthesia Topical only with 1% lidocaine    Timeout Verified patient identification, verified procedure, site/side was marked, verified correct patient position, special equipment/implants available, medications/allergies/relevant history reviewed, required imaging and test results available.  Sterile Technique Maximal sterile technique including full sterile barrier drape, hand hygiene, sterile gown, sterile gloves, mask, hair covering, sterile ultrasound probe cover (if used).  Procedure Description Area of catheter insertion was cleaned with chlorhexidine  and draped in sterile fashion.   With real-time ultrasound guidance a HD catheter was placed into the right internal jugular vein.  Nonpulsatile blood flow and easy flushing noted in all ports.  The catheter was sutured in place and sterile dressing applied.  Complications/Tolerance None; patient tolerated the procedure well. Chest X-ray is ordered to verify placement for internal jugular or subclavian cannulation.  Chest x-ray is not ordered for femoral cannulation.  EBL Minimal  Specimen(s) None

## 2023-12-14 NOTE — Progress Notes (Signed)
 Expand All Collapse All        [address]       [place] [address]             [phone]                              SUBJECTIVE: Increased pressor demand overnight More tachypneic       Vitals:    12/14/23 0530 12/14/23 0534  BP:      Pulse:   95  Resp:   (!) 26  Temp: 98.3 F (36.8 C)    SpO2:   92%      Intake/Output      01/13 0701 01/14 0700 01/14 0701 01/15 0700   P.O. 240    I.V. (mL/kg) 10 (0.2)    IV Piggyback 86.8    Total Intake(mL/kg) 336.8 (6)    Urine (mL/kg/hr)     Stool     Total Output     Net +336.8         Urine Occurrence 2 x           ceFAZolin  (ANCEF ) IV Stopped (12/14/23 0539)          CBC Labs (Brief)          Component Value Date/Time    WBC 6.1 12/14/2023 0444    RBC 2.64 (L) 12/14/2023 0444    HGB 9.0 (L) 12/14/2023 0444    HGB 11.8 09/30/2023 1221    HCT 26.2 (L) 12/14/2023 0444    HCT 35.3 09/30/2023 1221    PLT 181 12/14/2023 0444    PLT 173 09/30/2023 1221    MCV 99.2 12/14/2023 0444    MCV 101 (H) 09/30/2023 1221    MCH 34.1 (H) 12/14/2023 0444    MCHC 34.4 12/14/2023 0444    RDW 12.8 12/14/2023 0444    RDW 11.8 09/30/2023 1221    LYMPHSABS 1,632 08/03/2023 1252    MONOABS 0.4 05/14/2023 1130    EOSABS 128 08/03/2023 1252    BASOSABS 40 08/03/2023 1252      CMP     Labs (Brief)          Component Value Date/Time    NA 137 12/14/2023 0444    NA 141 09/30/2023 1221    K 4.4 12/14/2023 0444    CL 103 12/14/2023 0444    CO2 25 12/14/2023 0444    GLUCOSE 114 (H) 12/14/2023 0444    BUN 14 12/14/2023 0444    BUN 15 09/30/2023 1221    CREATININE 0.99 12/14/2023 0444    CREATININE 0.95 08/03/2023 1252    CALCIUM  9.0 12/14/2023 0444    PROT 7.2 12/03/2023 1109    ALBUMIN  3.8 12/03/2023 1109    AST 21 12/03/2023 1109    ALT 19 12/03/2023 1109    ALKPHOS 113 12/03/2023 1109    BILITOT 0.9 12/03/2023 1109    GFRNONAA >60 12/14/2023 0444    GFRNONAA 66 09/26/2020 0845    GFRAA  77 09/26/2020 0845      ABG Labs (Brief)          Component Value Date/Time    PHART 7.322 (L) 12/07/2023 1906    PCO2ART 39.4 12/07/2023 1906    PO2ART 138 (H) 12/07/2023 1906    HCO3 20.5 12/07/2023 1906    TCO2 22 12/07/2023 1906    ACIDBASEDEF 5.0 (H) 12/07/2023 1906    O2SAT  99 12/07/2023 1906      CBG (last 3)  Recent Labs (last 2 labs)       Recent Labs    12/13/23 1552 12/13/23 2200 12/14/23 0644  GLUCAP 112* 152* 106*    EXAM Lungs: clear Card: IRR Ext: warm Neuro: intact     ASSESSMENT: SP Mvreplacement Tvrepair Hemodynamics concerning for increased pressor demands. CT with R atrial thrombus but not sure if causing any compression. Would check ECHO to determine Needs new line for pressors and dialysis Back in aflutter. Needs to continue amiodarone  Continue tube feeds PT WBC down a bit Cultures pending On antibiotics

## 2023-12-14 NOTE — Progress Notes (Signed)
 Brief Nutrition Follow-up:  Pt restarted on CRRT today after being held on 1/12. Attempted iHD yesterday but intradialytic hypotension limited net UF to only 200 mL. Increasing pressor requirements, levophed  at 14.   Pt more lethargic today, reports feeling generally unwell.   CT C/A/P yesterday with R atrial thrombus, no acute abdominal issues. Repeat ECHO today  Remains anuric; Potassium and Phos trending up, sodium trending down. Electrolytes should improve with reinitiation of CRRT Potassium: 5.2 Phosphorus: 6.8 Sodium: 129  Tolerating Vital AF 1.2 at 55 ml/hr via Cortrak. +2 documented BMs yesterday  Interventions:  1) Continue TF via Cortrak: Vital AF 1.2 at 55 ml/hr TF at goal rate provides 1584 kcals, 99 g of protein and 1069 mL of free water  2) Increase Renal MVI to BID secondary to prolonged CRRT 3) Continue oral diet with oral supplements as tolerated (see RD assessment from 12/13/23)  Betsey Finger MS, RDN, LDN, CNSC Registered Dietitian 3 Clinical Nutrition RD Inpatient Contact Info in Amion

## 2023-12-14 NOTE — Progress Notes (Signed)
 NAME:  Vanessa [name], MRN:  [id number], DOB:  05/30/1951, LOS: 15 ADMISSION DATE:  11/29/2023, CONSULTATION DATE:  11/29/23 REFERRING MD:  Dr. Maryjane, CHIEF COMPLAINT:  postop   History of Present Illness:  73yoF with PMH significant for HTN, NICM, HFrEF (EF40-45%), PHTN, CKD and hx of prior severe MR s/p prior mitral clip x 2 in 2020.  Pt with progressive DOE found to have increasing MR  and moderate TR despite adjustments in GDMT.  Not felt to be candidate for additional mitral clip.  Repeat LHC without CAD but showed moderate PHTN.  Underwent mitral valve replacement with tissue valve and tricuspid valve repair by Dr. Maryjane on 12/30.  PCCM consulted to assist with vent and medical management post-operatively in ICU.   Pertinent  Medical History  Never smoker, MR w/ previous Mitral clip x 2 (2020), NICM, HFrEF, PHTN, HTN, CKD3b  Significant Hospital Events: Including procedures, antibiotic start and stop dates in addition to other pertinent events   MVR by Dr. Maryjane; relook for tamponade/washout later in evening without obvious source 1/2 intubated for respiratory decompensation, iNO started. Vanc, meropenem  started. CRRT started.  1/5 extubated 1/8 converted to NSR overnight 1/9 back to A.fib around 0200 1/11 R introducer out.  Unable to thread right subclavian catheter 1/12 remains weak still pressor dependent 1/13 repeat [facility] sent. Still on pressors..,  Zosyn  changed to meropenem .  Chest abdomen and pelvis CT obtained : Moderate hemorrhagic pericardial effusion, 5.3 cm hematoma right thrombus. Arterial gas bilateral patchy opacities mild dependent posterior right upper lobe atelectasis was unremarkable layering gallstones  Interim History / Subjective:  Denies pain.  Denies increased shortness of breath  Objective   Blood pressure (!) 92/52, pulse 83, temperature 98.4 F (36.9 C), temperature source Oral, resp. rate 16, height 5' 3 (1.6 m), weight 82.3 kg, SpO2 100%.         Intake/Output Summary (Last 24 hours) at 12/14/2023 0713 Last data filed at 12/14/2023 0700 Gross per 24 hour  Intake 2810.66 ml  Output 200 ml  Net 2610.66 ml   Filed Weights   12/13/23 0500 12/13/23 1624 12/14/23 0500  Weight: 76.5 kg 76.5 kg 82.3 kg   Examination:  General Chronically critically ill 73 year old female patient lying in bed currently not in acute distress but she is clearly fatigued HEENT normal cephalic atraumatic sclera remain icteric she does have jugular venous distention today Pulmonary diminished in the bases remains on nasal cannula a little more tachypneic CT of chest showed bibasilar atelectasis and right upper lobe atelectasis Cardiac A-fib irregular irregular Abdomen soft not tender Extremities are warm and dry still has dependent edema Neuro awake oriented x 3 no focal deficits  Resolved Hospital Problem list :   Postoperative vent management Postop tamponade resolved after relook and washout on day 1 Assessment & Plan:  Severe MR (s/p prior mitral clip x 2 in 2020) now s/p tissue MVR Moderate TR s/p TV repair c/b Moderate PHTN> progressed to severe postop; PA pressures significantly improved Plan Cont post op care per CVTS Mobilize, IS, PT. OT  Pain ctl  Hemodynamics remain barrier to progression  Getting ECHO to eval right atrial thrombus, results to determine further intervention Glycemic control  Post bypass vasoplegia and cardioplegia - complicated by sepsis/septic shock c/b NICM, HFrEF, EF 40-45% -source not clear, afebrile, but WBC cont to climb. No culture data available. PCT on 1/12 elevated but <2 so not super specific  Was on stress dose steroids from 1/3 to  1/6. Wbc trending down  Plan Day 4 of x zyvox  and 2 meropenem   Removed current HD cath. Will Place on right side, unfortunately I do not see an opportunity for line holiday given pressor needs Keep euvolemic CVL tip sent for culture  Cont midodrine  at 15mg  tid Cont NE for MAP >  65  Paroxysmal A-flutter, Afib  Back in fib again 1/13 Plan Cont systemic heparin   Changing amiodarone  back to intravenous Cont tele  Mg > 2 K > 4  Postop renal failure w/ worsening hyperkalemia and AGMA 1/13 Off CRRT, got iHD on 1/13. Not able to pull fluid due to hemodynamics Plan Will place new dialysis catheter and initiate CRRT again for volume removal F/u serial chems   Muscular deconditioning and protein calorie malnutrition Plan Cont PT Cont TF and POs  Elevated t bili.  Slowly improving. US  abd neg, suspect this was passive congestion related from her RV dysfunction, CT neg for GI concern  Plan Monitor   Difficult IV access Reviewed Dr Vernon notes from 1/11. Not able to thread right [place] line. Did have right internal jugular before which was removed on 8th Plan Will place right internal jugular later this am  Best Practice (right click and Reselect all SmartList Selections daily)   Diet/type: TF, regular as ordered; not taking in enough DVT prophylaxis systemic heparin  Pressure ulcer(s): None GI prophylaxis: N/A Lines: removed 1/13.  Foley:  Yes, and it is still needed Code Status:  full code Last date of multidisciplinary goals of care discussion [per primary]  My critical care time is 34 min

## 2023-12-14 NOTE — Progress Notes (Signed)
 Patient ID: Vanessa [name], female   DOB: 04-16-1951, 73 y.o.   MRN: [id number]  I reviewed her echo this afternoon.  It is stable compared to prior.  There is fibrinous material in the pericardial space but no evidence for tamponade/hemodynamic effect.   Ezra Shuck 12/14/2023

## 2023-12-14 NOTE — Progress Notes (Signed)
 Physical Therapy Treatment Patient Details Name: Vanessa [name] MRN: [id number] DOB: 1951/07/06 Today's Date: 12/14/2023   History of Present Illness 73 yo F admitted 12/30 with DOE, increasing MR and TR. LCH without CAD. 12/30 MVR and tricuspid valve repair. Intubated 1/2-1/5. 1/2 CRRT started. PMhx: HTN, NICM, HFrEF (EF40-45%), Pulmonary HTN, CKD and  severe MR s/p prior mitral clip x 2 in 2020.    PT Comments  Pt received in bed, receiving PD. Reports fatigue but willing to work with PT. Pt mobilizes to EOB with mod A +2, needs assist at lower body and trunk. Pt stood to RW 3x with need for progressively more assist due to fatigue, last time needed max A +2. Each time pt returned to sitting quickly. Could not maintain standing long enough to step feet. Reviewed sternal precautions and vc's to keep with mobility. Pt performed sitting there ex EOB. PT will continue to follow.     If plan is discharge home, recommend the following: A lot of help with walking and/or transfers;A lot of help with bathing/dressing/bathroom;Assistance with cooking/housework   Can travel by private vehicle        Equipment Recommendations  Rolling walker (2 wheels);BSC/3in1    Recommendations for Other Services Rehab consult     Precautions / Restrictions Precautions Precautions: Sternal;Fall;Other (comment) Precaution Booklet Issued: No Precaution Comments: cortrak, watch vitals Restrictions Weight Bearing Restrictions Per Provider Order: Yes (sternal precautions) RUE Weight Bearing Per Provider Order: Partial weight bearing (sternal precautions) LUE Weight Bearing Per Provider Order: Partial weight bearing (sternal precautions) Other Position/Activity Restrictions: sternal precautions     Mobility  Bed Mobility Overal bed mobility: Needs Assistance Bed Mobility: Rolling, Supine to Sit, Sit to Supine Rolling: Min assist   Supine to sit: Mod assist, +2 for safety/equipment, +2 for physical  assistance, HOB elevated Sit to supine: Max assist, +2 for physical assistance, +2 for safety/equipment   General bed mobility comments: pt needs assist at LE's and trunk for coming to EOB and returning to supine. Does initiate mvmt with vc's but needs mod to max A to complete    Transfers Overall transfer level: Needs assistance Equipment used: Rolling walker (2 wheels) Transfers: Sit to/from Stand Sit to Stand: Mod assist, +2 safety/equipment, Max assist           General transfer comment: mod A +2 to rise from bed for 2 trials and then needed max A +2 third time due to fatigue. Sits impulsively each time    Ambulation/Gait               General Gait Details: pt unable to maintain standing long enough to step feet   Stairs             Wheelchair Mobility     Tilt Bed    Modified Rankin (Stroke Patients Only)       Balance Overall balance assessment: Needs assistance Sitting-balance support: No upper extremity supported, Feet supported Sitting balance-Leahy Scale: Fair Sitting balance - Comments: CGA   Standing balance support: Bilateral upper extremity supported, Reliant on assistive device for balance Standing balance-Leahy Scale: Poor Standing balance comment: RW in standing and external support with fatigue                            Cognition Arousal: Alert Behavior During Therapy: WFL for tasks assessed/performed, Flat affect Overall Cognitive Status: Impaired/Different from baseline Area of Impairment: Memory, Problem solving, Awareness, Safety/judgement  Memory: Decreased recall of precautions   Safety/Judgement: Decreased awareness of safety Awareness: Emergent Problem Solving: Slow processing General Comments: pt able to verbalize precautions but still needs vc's to keep with function        Exercises General Exercises - Lower Extremity Ankle Circles/Pumps: AROM, Both, 20 reps, Seated Long  Arc Quad: AROM, Both, Seated, 10 reps    General Comments General comments (skin integrity, edema, etc.): Pt receiving PD. VSS on 6L O2 via [place]. Visitor present      Pertinent Vitals/Pain Pain Assessment Pain Assessment: No/denies pain    Home Living                          Prior Function            PT Goals (current goals can now be found in the care plan section) Acute Rehab PT Goals Patient Stated Goal: return home PT Goal Formulation: With patient Time For Goal Achievement: 12/20/23 Potential to Achieve Goals: Good Progress towards PT goals: Progressing toward goals (slowly)    Frequency    Min 1X/week      PT Plan      Co-evaluation              AM-PAC PT 6 Clicks Mobility   Outcome Measure  Help needed turning from your back to your side while in a flat bed without using bedrails?: A Lot Help needed moving from lying on your back to sitting on the side of a flat bed without using bedrails?: A Lot Help needed moving to and from a bed to a chair (including a wheelchair)?: A Lot Help needed standing up from a chair using your arms (e.g., wheelchair or bedside chair)?: A Lot Help needed to walk in hospital room?: Total Help needed climbing 3-5 steps with a railing? : Total 6 Click Score: 10    End of Session Equipment Utilized During Treatment: Oxygen Activity Tolerance: Patient limited by fatigue Patient left: with call bell/phone within reach;in bed;with family/visitor present;with nursing/sitter in room Nurse Communication: Mobility status PT Visit Diagnosis: Other abnormalities of gait and mobility (R26.89);Muscle weakness (generalized) (M62.81);Difficulty in walking, not elsewhere classified (R26.2)     Time: 8753-8679 PT Time Calculation (min) (ACUTE ONLY): 34 min  Charges:    $Therapeutic Activity: 23-37 mins PT General Charges $$ ACUTE PT VISIT: 1 Visit                     Richerd Lipoma, PT  Acute Rehab Services Secure  chat preferred Office [phone]    Richerd CROME Tully Mcinturff 12/14/2023, 2:32 PM

## 2023-12-14 NOTE — Progress Notes (Addendum)
 Patient ID: Vanessa [name], female   DOB: 03-Sep-1951, 73 y.o.   MRN: [id number]      Advanced Heart Failure Rounding Note  Cardiologist: Dr. Rolan  Chief Complaint: Cardiogenic shock  Subjective:   S/P MVR (tissue) and TV ring and same day return to OR for tamponade/bleeding. 1/2: Progressive volume overload with poor UOP, CVVH started but ended up re-intubated.   Extubated 12/05/23 1/12: CVVH stopped 1/13: iHD, HD line removed  Had very little volume removed iHD yesterday evening. HD line removed overnight.  CT C/A/P last night with 5.3 cm hematoma along right heart border.  NE up to 18 overnight, currently at 14.   Back in Afib/flutter.   More lethargic today. States she jsut doesn't feel well.   Objective:   Weight Range: 82.3 kg Body mass index is 32.14 kg/m.   Vital Signs:   Temp:  [97.7 F (36.5 C)-98.6 F (37 C)] 98.4 F (36.9 C) (01/14 0345) Pulse Rate:  [77-107] 83 (01/14 0700) Resp:  [13-30] 16 (01/14 0700) BP: (72-116)/(46-83) 92/52 (01/14 0700) SpO2:  [91 %-100 %] 100 % (01/14 0700) Weight:  [76.5 kg-82.3 kg] 82.3 kg (01/14 0500) Last BM Date : 12/13/23  Weight change: Filed Weights   12/13/23 0500 12/13/23 1624 12/14/23 0500  Weight: 76.5 kg 76.5 kg 82.3 kg   Intake/Output:   Intake/Output Summary (Last 24 hours) at 12/14/2023 0725 Last data filed at 12/14/2023 0700 Gross per 24 hour  Intake 2810.66 ml  Output 200 ml  Net 2610.66 ml    Physical Exam   General: Fatigued appearing.  HEENT: normal Neck: supple. JVP elevated.  Cor: PMI nondisplaced. . No rubs, gallops or murmurs. Rhythm irregular.  Lungs: clear Abdomen: soft, nontender, nondistended. No hepatosplenomegaly. No bruits or masses. Good bowel sounds. Extremities: no cyanosis, clubbing, rash, 1+ edema Neuro: alert & oriented x 3. Affect flat.   Telemetry   Afib/flutter 80s-90s  Labs    CBC Recent Labs    12/13/23 1048 12/14/23 0240  WBC 29.1* 23.3*  HGB 9.5* 9.2*   HCT 31.6* 29.3*  MCV 99.7 96.4  PLT 141* 126*   Basic Metabolic Panel Recent Labs    98/86/74 0303 12/14/23 0240  NA 131* 131*  K 5.3* 4.2  CL 91* 92*  CO2 21* 26  GLUCOSE 135* 124*  BUN 56* 36*  CREATININE 3.02* 2.50*  CALCIUM  9.2 8.6*  MG 2.9* 2.2  PHOS 8.3* 5.3*   Liver Function Tests Recent Labs    12/12/23 0939 12/13/23 0303 12/14/23 0240  AST 41 39  --   ALT 44 44  --   ALKPHOS 76 66  --   BILITOT 13.0* 11.9*  --   PROT 7.5 7.4  --   ALBUMIN  2.9* 2.7*  2.8* 2.6*   No results for input(s): LIPASE, AMYLASE in the last 72 hours. Cardiac Enzymes No results for input(s): CKTOTAL, CKMB, CKMBINDEX, TROPONINI in the last 72 hours.  BNP: BNP (last 3 results) Recent Labs    05/26/23 1610 08/26/23 1103 09/22/23 0905  BNP 778.0* 781.0* 926.0*   Other results:  Imaging   CT CHEST ABDOMEN PELVIS WO CONTRAST Result Date: 12/13/2023 CLINICAL DATA:  Sepsis EXAM: CT CHEST, ABDOMEN AND PELVIS WITHOUT CONTRAST TECHNIQUE: Multidetector CT imaging of the chest, abdomen and pelvis was performed following the standard protocol without IV contrast. RADIATION DOSE REDUCTION: This exam was performed according to the departmental dose-optimization program which includes automated exposure control, adjustment of the mA and/or kV according  to patient size and/or use of iterative reconstruction technique. COMPARISON:  CT chest dated 11/15/2023. FINDINGS: CT CHEST FINDINGS Cardiovascular: Mild cardiomegaly. Interval mitral and tricuspid valve replacement. Moderate hemorrhagic pericardial effusion, new, with focal 5.3 cm hematoma along the right heart border (coronal image 46) with adjacent tiny foci of pericardial gas (series 3/image 26), likely postprocedural. Interval placement of a left IJ venous catheter terminating along the right lateral aspect of the lower SVC. No evidence of thoracic aortic aneurysm. Atherosclerotic calcifications of the aortic arch. Mediastinum/Nodes:  No suspicious mediastinal lymphadenopathy. Markedly enlarged left thyroid  gland with thyroid  goiter. Lungs/Pleura: Patchy bilateral lower lobe opacities, likely atelectasis. Additional mild dependent atelectasis in the posterior right upper lobe. No focal consolidation. No suspicious pulmonary nodules. No pleural effusion or pneumothorax. Musculoskeletal: Thoracic spine is within normal limits. Median sternotomy. CT ABDOMEN PELVIS FINDINGS Hepatobiliary: Unenhanced liver is unremarkable. Layering gallstones (series 3/image 86), without associated inflammatory changes. Pancreas: Within normal limits. Spleen: Within normal limits. Adrenals/Urinary Tract: Adrenal glands are within normal limits. Right kidney is within normal limits. 1.6 cm simple cyst in the anterior left upper kidney (series 3/image 82), benign (Bosniak I). No follow-up is recommended. No hydronephrosis. Bladder is underdistended and poorly evaluated. Stomach/Bowel: Enteric tube terminates in the proximal duodenum. No evidence of bowel obstruction. Normal appendix (series 3/image 81). No colonic wall thickening or inflammatory changes. Vascular/Lymphatic: No evidence of abdominal aortic aneurysm. No suspicious abdominopelvic lymphadenopathy. Reproductive: Calcified uterine fibroids. Bilateral ovaries are within normal limits. Other: No abdominopelvic ascites. Musculoskeletal: Mild degenerative changes of the lumbar spine. IMPRESSION: Interval mitral and tricuspid valve replacement. Moderate hemorrhagic pericardial effusion, new, with focal 5.3 cm hematoma along the right heart border, likely postprocedural. Consider echocardiography for further evaluation, as clinically warranted. Patchy bilateral lower lobe opacities, likely atelectasis. No acute findings in the abdomen/pelvis. Additional ancillary findings as above. Electronically Signed   By: Pinkie Pebbles M.D.   On: 12/13/2023 23:19        Medications:   Scheduled Medications:   amiodarone   400 mg Per Tube BID   Chlorhexidine  Gluconate Cloth  6 each Topical Daily   Chlorhexidine  Gluconate Cloth  6 each Topical Q0600   darbepoetin (ARANESP ) injection - NON-DIALYSIS  40 mcg Subcutaneous Q Tue-1800   feeding supplement  1 Container Oral TID BM   fiber supplement (BANATROL TF)  60 mL Per Tube TID   Gerhardt's butt cream   Topical BID   influenza vaccine adjuvanted  0.5 mL Intramuscular Tomorrow-1000   insulin  aspart  0-24 Units Subcutaneous Q4H   linezolid   600 mg Per Tube Q12H   midodrine   15 mg Per Tube Q8H   multivitamin  1 tablet Per Tube QHS   mouth rinse  15 mL Mouth Rinse 4 times per day    Infusions:  sodium chloride  5 mL/hr at 12/12/23 0400   sodium chloride  Stopped (12/11/23 1110)   sodium chloride      anticoagulant sodium citrate      feeding supplement (VITAL AF 1.2 CAL) 55 mL/hr at 12/14/23 0700   heparin  1,100 Units/hr (12/14/23 0700)   meropenem  (MERREM ) IV Stopped (12/13/23 1354)   norepinephrine  (LEVOPHED ) Adult infusion 14 mcg/min (12/14/23 0700)    PRN Medications: sodium chloride , sodium chloride , alteplase , anticoagulant sodium citrate , artificial tears, bisacodyl  **OR** bisacodyl , docusate, heparin , HYDROmorphone  (DILAUDID ) injection, levalbuterol , lidocaine  (PF), lidocaine -prilocaine , ondansetron  (ZOFRAN ) IV, mouth rinse, oxyCODONE , pentafluoroprop-tetrafluoroeth, pneumococcal 20-valent conjugate vaccine, traZODone   Patient Profile   Vanessa [name] is a 73 y.o. with history of nonischemic  cardiomyopathy and severe MR s/p Mitral Clip x2 (2020), pHTN, and CKD IIIb.   Assessment/Plan   1. Post Cardiotomy Cardiogenic & septic shock: Bioprosthetic MVR and tricuspid ring 11/29/23 with Dr. Maryjane. Pre-op  cath with no significant CAD.  Pre-op  echo with EF 40-45%, severe MR. Went back to the OR with post-op tamponade for clean-out.  Echo 12/31 showed EF 25-30%, moderate RV dysfunction, s/p bioprosthetic mitral valve replacement with trivial MR  and tricuspid valve repair with trivial TR. Worsened on 1/2 and re-intubated in setting of volume overload, oliguric renal failure &  pulmonary hypertension. Extubated on 12/04/22. Progressive renal failure requiring CVVH for volume removal.  Now off CVVH on 1/12.  Have also had concern for septic shock component and on abx with markedly elevated WBCs and high PCT. Pressor requirements up overnight to 18 NE, down to 14 this am. NE running peripherally. HD line removed overnight for line holiday.  - Concerned she may need CVVH again today. Poor response to IV lasix  yesterday.  - Discussed with CCM, planning to replace HD line today. - Continue midodrine  15 tid.  - CT C/A/P last night with moderate hemorrhagic pericardial effusion and 5.3 cm hematoma right heart border. Check echo to ensure no evidence of tamponade. 2. Mitral regurgitation:  TEE in 11/19 with severe MR, restricted posterior leaflet.  She had Mitraclip x 2 in 1/20. Post-op echo showed mild MR and mild mitral stenosis.   Echo in 9/24 showed MV s/p Mitraclip x 2 with mean gradient 6 mmHg and severe mitral regurgitation. TEE was then done to evaluate, showing 2 Mitraclips in place with mean gradient 4 mmHg and severe eccentric MR from between the 2 clips with ERO 0.54 cm^2.  She was not thought to be a candidate for an additional Mitraclip due to lack of room between the two existing clips. R/LHC (11/24) showed moderate mixed pulmonary arterial and pulmonary venous hypertension likely due to mitral valve regurgitation.  S/p MV replacement and Tricuspid repair on 11/29/23 with Dr. Maryjane.  - Repeat echo today as above. 3. Ventricular ectopy: NSR on telemetry.  - ON po amiodarone . Switch to IV amio at 30/hr d/t recurrent afib/flutter. 4. Pulmonary hypertension: Most recent pre-op  RHC (11/24) with PVR 4.5, with moderate mixed pulmonary arterial and pulmonary venous hypertension likely due to mitral valve regurgitation. Now s/p MVR, PA pressure  severely elevated post-op.  Suspect still mixed PAH/PVH with elevated PCWP.  NO was tried at 10 ppm but did not have much effect so now off.  Will hold off on pulmonary vasodilators; small clinical trials demonstrate no improvement and some markers of harm with PDE5i in PH in the setting of mitral valve disease.  5. Toxic multinodular goiter: She is now off methimazole . TSH was normal in 8/24. Follows with Endocrinology 6. AKI on CKD stage 3: CVVH begun post-op.  - Poor UOP with IV lasix  challenge. - Concerned she will need CVVH today. Will discuss with nephrology. 7. Blood loss anemia - Hgb stable.  8. Complete heart block: She had post-op CHB. Resolved. Afib/flutter today 9. Hypoxic / Hypercapnic respiratory failure:  - On 2L [place]. More tachypneic today. Planning for CVVH. 10. Atrial fibrillation: Patient previously in AF with controlled rate. Back in Afib. Restart amiodarone  gtt. 11. ID: Completed 7 day course of vanc/merrem  with no obvious source of infection. 1/9 abdominal US  no evidence for cholecystitis.  WBCs up to 27, down to 23 today.  - On linezolid /Zosyn .  - HD cath removed last night  for line holiday. Cath tip cultured. 12. Thrombocytopenia: Mild.  - Platelets 126K today 13. Deconditioning - will need extensive PT.  14. FEN: Getting tube feeds   FINCH, LINDSAY N 12/14/2023 7:25 AM  Patient seen with PA, agree with the above note.   iHD last night and HD catheter removed.  She is on linezolid /meropenem , WBCs trending down 29 => 23.    Increasing pressor requirement, NE at 14 peripherally.  Unable to remove much fluid via HD (net 200 cc).  She also remains on midodrine  15 tid.   CT chest/abd/pelvis yesterday for source of infection showed lung base atelectasis and a 5 cm hematoma along the right heart border.    She is back in atrial fibrillation today with controlled rate.   General: NAD Neck: JVP 14 cm, no thyromegaly or thyroid  nodule.  Lungs: Clear to auscultation  bilaterally with normal respiratory effort. CV: Nondisplaced PMI.  Heart irregular S1/S2, no S3/S4, no murmur.  No peripheral edema.   Abdomen: Soft, nontender, no hepatosplenomegaly, no distention.  Skin: Intact without lesions or rashes.  Neurologic: Alert and oriented x 3.  Psych: Normal affect. Extremities: No clubbing or cyanosis.  HEENT: Normal.   Continue IV broad spectrum abx.  Had line holiday but will need line today given need to restart CVVH and higher pressor requirement.   She looks volume overloaded and we were unable to pull much fluid with iHD.   - Replace dialysis catheter and will restart CVVH for gentle volume removal. Discussed with nephrologist.  - Wean NE as tolerated.  - Continue midodrine .  - Follow CVP and co-ox when line is replaced.   Need closer evaluation of hematoma along right heart border.  Echo today to rule out tamponade physiology.  May be surgical material injected during pericardial clean-out.   Back in AF, will transition from po to IV amiodarone .  Continue heparin  gtt.   CRITICAL CARE Performed by: Ezra Shuck  Total critical care time: 45 minutes  Critical care time was exclusive of separately billable procedures and treating other patients.  Critical care was necessary to treat or prevent imminent or life-threatening deterioration.  Critical care was time spent personally by me on the following activities: development of treatment plan with patient and/or surrogate as well as nursing, discussions with consultants, evaluation of patient's response to treatment, examination of patient, obtaining history from patient or surrogate, ordering and performing treatments and interventions, ordering and review of laboratory studies, ordering and review of radiographic studies, pulse oximetry and re-evaluation of patient's condition.  Ezra Shuck 12/14/2023 7:56 AM

## 2023-12-14 NOTE — Progress Notes (Signed)
 [name]PHROLOGY PROGRESS NOTE  Assessment/ Plan: Pt is a 73 y.o. yo female  with a past medical history HTN, NICM, CHF, pulmonary hypertension, mitral and tricuspid valve disease who present w/ surgery for valvular disease c/b shock, AHRF, and AKI   # Oligo-Anuric AKI on CKD 3A: Multifactorial etiology including cardiorenal syndrome and ischemic ATN due to shock.  Started CRRT on 1/2 for fluid overload.  The CRRT was held on 1/12 because of worsening leukocytosis and poor access with plan to have line holiday. Received IHD on 1/13 with only 200 cc UF, problem with intradialytic hypotension with high requirement of pressors. Today, patient is short of breath and evidence of fluid overload.  She will need CRRT to offload volume after placement of new HD catheter by PCCM team.  Continue daily lab and a strict ins and out.  Discussed with cardiology and ICU team.   #Multifactorial shock: Vasoplegic and cardioplegia catheter surgery.   - Has been on pressors and inotropes.  Now off milrinone .  Receiving Levophed .     - she is on midodrine   - s/p antibiotics (s/p meropenem  and s/p vanc) and is s/p stress dose steroids.  Resume antibiotics per primary team discretion  - planning for line holiday as above    #Acute exacerbation of heart failure with reduced ejection fraction: Volume overload.  - Optimize volume with renal replacement therapy as above    #Severe MR and tricuspid valvular disease:  - Status post repair.  CT surgery managing   #Postoperative cardiac tamponade:  - Continue monitoring per cardiology   # Hyponatremia: In the setting of AKI, monitor.     #Anemia: Related to blood loss and compounded by AKI and critical illness.    - Transfusions per primary team.   - Started aranesp  40 mcg weekly on Tuesdays   I have discussed with the ICU and dialysis team.   Subjective: Seen and examined in ICU.  Noted she had dialysis yesterday for 3 hours and tolerated  well except hypotensive episode and requirement of pressors.  Not much UF.  Short of breath this morning and no significant urine output.   Objective Vital signs in last 24 hours: Vitals:   12/14/23 0700 12/14/23 0715 12/14/23 0730 12/14/23 0732  BP: (!) 92/52 (!) 89/56 (!) 97/51   Pulse: 83 86 88   Resp: 16 15 13    Temp:    98.4 F (36.9 C)  TempSrc:    Oral  SpO2: 100% 100% 100%   Weight:      Height:       Weight change: 0 kg  Intake/Output Summary (Last 24 hours) at 12/14/2023 0800 Last data filed at 12/14/2023 0700 Gross per 24 hour  Intake 2699.34 ml  Output 200 ml  Net 2499.34 ml       Labs: RENAL PANEL Recent Labs  Lab 12/08/23 0338 12/08/23 0339 12/11/23 0106 12/11/23 1458 12/12/23 0215 12/12/23 0939 12/13/23 0303 12/14/23 0240  NA  --    < > 134* 133* 134*  --  131* 131*  K  --    < > 4.9 5.4* 4.4  --  5.3* 4.2  CL  --    < > 96* 97* 97*  --  91* 92*  CO2  --    < > 26 25 25   --  21* 26  GLUCOSE  --    < > 148* 133* 111*  --  135* 124*  BUN  --    < >  26* 26* 23  --  56* 36*  CREATININE  --    < > 1.47* 1.52* 1.37*  --  3.02* 2.50*  CALCIUM   --    < > 8.9 8.9 8.9  --  9.2 8.6*  MG 2.6*  --  2.8*  --  2.8*  --  2.9* 2.2  PHOS  --    < > 3.7 3.3 3.6  --  8.3* 5.3*  ALBUMIN   --    < > 3.0* 2.9* 3.0* 2.9* 2.7*  2.8* 2.6*   < > = values in this interval not displayed.    Liver Function Tests: Recent Labs  Lab 12/10/23 0339 12/10/23 1635 12/12/23 0939 12/13/23 0303 12/14/23 0240  AST 45*  --  41 39  --   ALT 34  --  44 44  --   ALKPHOS 83  --  76 66  --   BILITOT 13.5*  --  13.0* 11.9*  --   PROT 6.9  --  7.5 7.4  --   ALBUMIN  2.9*  2.9*   < > 2.9* 2.7*  2.8* 2.6*   < > = values in this interval not displayed.   No results for input(s): LIPASE, AMYLASE in the last 168 hours. No results for input(s): AMMONIA in the last 168 hours.  CBC: Recent Labs    12/11/23 0106 12/12/23 0215 12/13/23 0303 12/13/23 1048 12/14/23 0240  HGB  9.8* 10.4* 10.2* 9.5* 9.2*  MCV 93.7 94.5 97.1 99.7 96.4    Cardiac Enzymes: No results for input(s): CKTOTAL, CKMB, CKMBINDEX, TROPONINI in the last 168 hours. CBG: Recent Labs  Lab 12/13/23 1546 12/13/23 1947 12/13/23 2331 12/14/23 0334 12/14/23 0734  GLUCAP 139* 128* 143* 151* 140*    Iron  Studies: No results for input(s): IRON , TIBC, TRANSFERRIN, FERRITIN in the last 72 hours. Studies/Results: CT CHEST ABDOMEN PELVIS WO CONTRAST Result Date: 12/13/2023 CLINICAL DATA:  Sepsis EXAM: CT CHEST, ABDOMEN AND PELVIS WITHOUT CONTRAST TECHNIQUE: Multidetector CT imaging of the chest, abdomen and pelvis was performed following the standard protocol without IV contrast. RADIATION DOSE REDUCTION: This exam was performed according to the departmental dose-optimization program which includes automated exposure control, adjustment of the mA and/or kV according to patient size and/or use of iterative reconstruction technique. COMPARISON:  CT chest dated 11/15/2023. FINDINGS: CT CHEST FINDINGS Cardiovascular: Mild cardiomegaly. Interval mitral and tricuspid valve replacement. Moderate hemorrhagic pericardial effusion, new, with focal 5.3 cm hematoma along the right heart border (coronal image 46) with adjacent tiny foci of pericardial gas (series 3/image 26), likely postprocedural. Interval placement of a left IJ venous catheter terminating along the right lateral aspect of the lower SVC. No evidence of thoracic aortic aneurysm. Atherosclerotic calcifications of the aortic arch. Mediastinum/Nodes: No suspicious mediastinal lymphadenopathy. Markedly enlarged left thyroid  gland with thyroid  goiter. Lungs/Pleura: Patchy bilateral lower lobe opacities, likely atelectasis. Additional mild dependent atelectasis in the posterior right upper lobe. No focal consolidation. No suspicious pulmonary nodules. No pleural effusion or pneumothorax. Musculoskeletal: Thoracic spine is within normal limits. Median  sternotomy. CT ABDOMEN PELVIS FINDINGS Hepatobiliary: Unenhanced liver is unremarkable. Layering gallstones (series 3/image 86), without associated inflammatory changes. Pancreas: Within normal limits. Spleen: Within normal limits. Adrenals/Urinary Tract: Adrenal glands are within normal limits. Right [name] is within normal limits. 1.6 cm simple cyst in the anterior left upper [name] (series 3/image 82), benign (Bosniak I). No follow-up is recommended. No hydronephrosis. Bladder is underdistended and poorly evaluated. Stomach/Bowel: Enteric tube terminates in the proximal duodenum. No  evidence of bowel obstruction. Normal appendix (series 3/image 81). No colonic wall thickening or inflammatory changes. Vascular/Lymphatic: No evidence of abdominal aortic aneurysm. No suspicious abdominopelvic lymphadenopathy. Reproductive: Calcified uterine fibroids. Bilateral ovaries are within normal limits. Other: No abdominopelvic ascites. Musculoskeletal: Mild degenerative changes of the lumbar spine. IMPRESSION: Interval mitral and tricuspid valve replacement. Moderate hemorrhagic pericardial effusion, new, with focal 5.3 cm hematoma along the right heart border, likely postprocedural. Consider echocardiography for further evaluation, as clinically warranted. Patchy bilateral lower lobe opacities, likely atelectasis. No acute findings in the abdomen/pelvis. Additional ancillary findings as above. Electronically Signed   By: Pinkie Pebbles M.D.   On: 12/13/2023 23:19    Medications: Infusions:  sodium chloride  5 mL/hr at 12/12/23 0400   sodium chloride  Stopped (12/11/23 1110)   sodium chloride      amiodarone      anticoagulant sodium citrate      feeding supplement (VITAL AF 1.2 CAL) 55 mL/hr at 12/14/23 0700   heparin  1,100 Units/hr (12/14/23 0700)   meropenem  (MERREM ) IV Stopped (12/13/23 1354)   norepinephrine  (LEVOPHED ) Adult infusion 14 mcg/min (12/14/23 0700)    Scheduled Medications:  Chlorhexidine   Gluconate Cloth  6 each Topical Daily   Chlorhexidine  Gluconate Cloth  6 each Topical Q0600   darbepoetin (ARANESP ) injection - NON-DIALYSIS  40 mcg Subcutaneous Q Tue-1800   feeding supplement  1 Container Oral TID BM   fiber supplement (BANATROL TF)  60 mL Per Tube TID   Gerhardt's butt cream   Topical BID   influenza vaccine adjuvanted  0.5 mL Intramuscular Tomorrow-1000   insulin  aspart  0-24 Units Subcutaneous Q4H   linezolid   600 mg Per Tube Q12H   midodrine   15 mg Per Tube Q8H   multivitamin  1 tablet Per Tube QHS   mouth rinse  15 mL Mouth Rinse 4 times per day    have reviewed scheduled and prn medications.  Physical Exam: General:NAD, comfortable Heart:RRR, s1s2 nl Lungs: Reduced breath sound bilateral. Abdomen:soft, Non-tender, non-distended Extremities: Peripheral  edema+ Dialysis Access: No HD line currently. Reida Hem Prasad Zehava Turski 12/14/2023,8:00 AM  LOS: 15 days

## 2023-12-14 NOTE — Progress Notes (Addendum)
 Gradually more hypotensive on cuff pressures, increased NE needs (as high as 18mcg/hr). Discussed with [facility] MD. Albumin  25% ordered and peripheral levophed  started in PIV in RFA. IV watch in place for infiltration monitoring.    [facility] HDC removed at 0330. HDC tip sent down to labs for cultures. CT C/A/P done overnight as well.

## 2023-12-14 NOTE — Plan of Care (Signed)
  Problem: Education: Goal: Knowledge of General Education information will improve Description: Including pain rating scale, medication(s)/side effects and non-pharmacologic comfort measures Outcome: Progressing   Problem: Health Behavior/Discharge Planning: Goal: Ability to manage health-related needs will improve Outcome: Progressing   Problem: Clinical Measurements: Goal: Ability to maintain clinical measurements within normal limits will improve Outcome: Progressing Goal: Will remain free from infection Outcome: Progressing Goal: Diagnostic test results will improve Outcome: Progressing Goal: Respiratory complications will improve Outcome: Progressing Goal: Cardiovascular complication will be avoided Outcome: Progressing   Problem: Activity: Goal: Risk for activity intolerance will decrease Outcome: Progressing   Problem: Nutrition: Goal: Adequate nutrition will be maintained Outcome: Progressing   Problem: Coping: Goal: Level of anxiety will decrease Outcome: Progressing   Problem: Elimination: Goal: Will not experience complications related to bowel motility Outcome: Progressing   Problem: Pain Management: Goal: General experience of comfort will improve Outcome: Progressing

## 2023-12-15 DIAGNOSIS — Z952 Presence of prosthetic heart valve: Secondary | ICD-10-CM | POA: Diagnosis not present

## 2023-12-15 DIAGNOSIS — I272 Pulmonary hypertension, unspecified: Secondary | ICD-10-CM | POA: Diagnosis not present

## 2023-12-15 DIAGNOSIS — R570 Cardiogenic shock: Secondary | ICD-10-CM | POA: Diagnosis not present

## 2023-12-15 DIAGNOSIS — I340 Nonrheumatic mitral (valve) insufficiency: Secondary | ICD-10-CM | POA: Diagnosis not present

## 2023-12-15 LAB — RENAL FUNCTION PANEL
Albumin: 2.3 g/dL — ABNORMAL LOW (ref 3.5–5.0)
Albumin: 2.5 g/dL — ABNORMAL LOW (ref 3.5–5.0)
Anion gap: 9 (ref 5–15)
Anion gap: 8 (ref 5–15)
BUN: 25 mg/dL — ABNORMAL HIGH (ref 8–23)
BUN: 24 mg/dL — ABNORMAL HIGH (ref 8–23)
CO2: 23 mmol/L (ref 22–32)
CO2: 26 mmol/L (ref 22–32)
Calcium: 7.7 mg/dL — ABNORMAL LOW (ref 8.9–10.3)
Calcium: 8.6 mg/dL — ABNORMAL LOW (ref 8.9–10.3)
Chloride: 101 mmol/L (ref 98–111)
Chloride: 98 mmol/L (ref 98–111)
Creatinine, Ser: 1.66 mg/dL — ABNORMAL HIGH (ref 0.44–1.00)
Creatinine, Ser: 1.48 mg/dL — ABNORMAL HIGH (ref 0.44–1.00)
GFR, Estimated: 33 mL/min — ABNORMAL LOW (ref >60)
GFR, Estimated: 37 mL/min — ABNORMAL LOW (ref >60)
Glucose, Bld: 164 mg/dL — ABNORMAL HIGH (ref 70–99)
Glucose, Bld: 136 mg/dL — ABNORMAL HIGH (ref 70–99)
Phosphorus: 3.8 mg/dL (ref 2.5–4.6)
Phosphorus: 3.4 mg/dL (ref 2.5–4.6)
Potassium: 4.3 mmol/L (ref 3.5–5.1)
Potassium: 5.1 mmol/L (ref 3.5–5.1)
Sodium: 133 mmol/L — ABNORMAL LOW (ref 135–145)
Sodium: 132 mmol/L — ABNORMAL LOW (ref 135–145)

## 2023-12-15 LAB — APTT: aPTT: 82 s — ABNORMAL HIGH (ref 24–36)

## 2023-12-15 LAB — GLUCOSE, CAPILLARY
Glucose-Capillary: 144 mg/dL — ABNORMAL HIGH (ref 70–99)
Glucose-Capillary: 144 mg/dL — ABNORMAL HIGH (ref 70–99)
Glucose-Capillary: 118 mg/dL — ABNORMAL HIGH (ref 70–99)
Glucose-Capillary: 117 mg/dL — ABNORMAL HIGH (ref 70–99)
Glucose-Capillary: 135 mg/dL — ABNORMAL HIGH (ref 70–99)
Glucose-Capillary: 133 mg/dL — ABNORMAL HIGH (ref 70–99)

## 2023-12-15 LAB — CBC
HCT: 29.6 % — ABNORMAL LOW (ref 36.0–46.0)
Hemoglobin: 8.9 g/dL — ABNORMAL LOW (ref 12.0–15.0)
MCH: 29.9 pg (ref 26.0–34.0)
MCHC: 30.1 g/dL (ref 30.0–36.0)
MCV: 99.3 fL (ref 80.0–100.0)
Platelets: 111 10*3/uL — ABNORMAL LOW (ref 150–400)
RBC: 2.98 MIL/uL — ABNORMAL LOW (ref 3.87–5.11)
RDW: 22.4 % — ABNORMAL HIGH (ref 11.5–15.5)
WBC: 14.7 10*3/uL — ABNORMAL HIGH (ref 4.0–10.5)
nRBC: 0.6 % — ABNORMAL HIGH (ref 0.0–0.2)

## 2023-12-15 LAB — COOXEMETRY PANEL
Carboxyhemoglobin: 2.6 % — ABNORMAL HIGH (ref 0.5–1.5)
Methemoglobin: <0.7 % (ref 0.0–1.5)
O2 Saturation: 70.3 %
Total hemoglobin: 8.7 g/dL — ABNORMAL LOW (ref 12.0–16.0)

## 2023-12-15 LAB — PROTIME-INR
INR: 1.1 (ref 0.8–1.2)
Prothrombin Time: 14.7 s (ref 11.4–15.2)

## 2023-12-15 LAB — HEPARIN LEVEL (UNFRACTIONATED): Heparin Unfractionated: 0.38 [IU]/mL (ref 0.30–0.70)

## 2023-12-15 LAB — MAGNESIUM: Magnesium: 2.3 mg/dL (ref 1.7–2.4)

## 2023-12-15 MED ORDER — WARFARIN SODIUM 2.5 MG PO TABS
2.5000 mg | ORAL_TABLET | Freq: Once | ORAL | Status: AC
  Administered 2023-12-15: 2.5 mg via ORAL
  Filled 2023-12-15: qty 1

## 2023-12-15 MED ORDER — WARFARIN - PHARMACIST DOSING INPATIENT
Freq: Every day | Status: DC
Start: 2023-12-15 — End: 2023-12-21

## 2023-12-15 NOTE — Plan of Care (Signed)
  Problem: Activity: Goal: Risk for activity intolerance will decrease Outcome: Progressing   Problem: Nutrition: Goal: Adequate nutrition will be maintained Outcome: Progressing   Problem: Elimination: Goal: Will not experience complications related to bowel motility Outcome: Progressing   Problem: Pain Management: Goal: General experience of comfort will improve Outcome: Progressing   Problem: Safety: Goal: Ability to remain free from injury will improve Outcome: Progressing

## 2023-12-15 NOTE — Progress Notes (Addendum)
 NAME:  Vanessa [name], MRN:  [id number], DOB:  04/23/1951, LOS: 16 ADMISSION DATE:  11/29/2023, CONSULTATION DATE:  11/29/23 REFERRING MD:  Dr. Honey Lusty, CHIEF COMPLAINT:  postop   History of Present Illness:  73yoF with PMH significant for HTN, NICM, HFrEF (EF40-45%), PHTN, CKD and hx of prior severe MR s/p prior mitral clip x 2 in 2020.  Pt with progressive DOE found to have increasing MR  and moderate TR despite adjustments in GDMT.  Not felt to be candidate for additional mitral clip.  Repeat LHC without CAD but showed moderate PHTN.  Underwent mitral valve replacement with tissue valve and tricuspid valve repair by Dr. Honey Lusty on 12/30.  PCCM consulted to assist with vent and medical management post-operatively in ICU.   Pertinent  Medical History  Never smoker, MR w/ previous Mitral clip x 2 (2020), NICM, HFrEF, PHTN, HTN, CKD3b  Significant Hospital Events: Including procedures, antibiotic start and stop dates in addition to other pertinent events   MVR by Dr. Honey Lusty; relook for tamponade/washout later in evening without obvious source 1/2 intubated for respiratory decompensation, iNO started. Vanc, meropenem  started. CRRT started.  1/5 extubated 1/8 converted to NSR overnight 1/9 back to A.fib around 0200 1/11 R introducer out.  Unable to thread right subclavian catheter 1/12 remains weak still pressor dependent 1/13 repeat [facility] sent. Still on pressors..,  Zosyn  changed to meropenem .  Chest abdomen and pelvis CT obtained : Moderate hemorrhagic pericardial effusion, 5.3 cm hematoma right thrombus. Arterial gas bilateral patchy opacities mild dependent posterior right upper lobe atelectasis was unremarkable layering gallstones  1/14 new right IJ HD catheter placed.  The echocardiogram Was obtained to better evaluate her EF is 35 to 40%.  The LV demonstrates regional wall motion abnormalities there is asymmetric left ventricular hypertrophy of the inferior lateral segment RV systolic  function moderate regular reduced left atrial wall dilated no mention of clot.  1/15 still on fairly high-dose norepinephrine  Interim History / Subjective:  No distress, but really not progressing staying about the same  Objective   Blood pressure (!) 102/54, pulse 98, temperature 98.3 F (36.8 C), temperature source Oral, resp. rate (!) 23, height 5\' 3"  (1.6 m), weight 79.5 kg, SpO2 98%. CVP:  [1 mmHg-37 mmHg] 1 mmHg      Intake/Output Summary (Last 24 hours) at 12/15/2023 0749 Last data filed at 12/15/2023 0700 Gross per 24 hour  Intake 2874.93 ml  Output 3264.2 ml  Net -389.27 ml   Filed Weights   12/13/23 1624 12/14/23 0500 12/15/23 0545  Weight: 76.5 kg 82.3 kg 79.5 kg   Examination:  General Critically ill 73 year old female patient she sitting up in bed currently in no acute distress this morning but overall feels fatigued HEENT normocephalic atraumatic no clear JVD visible this morning.  The right IJ HD catheter is unremarkable the left IJ dressing is intact Pulmonary: Currently 1 L/min nasal cannula mild accessory use with abdominal breathing crackles both bases Cardiac regular irregular atrial fibrillation on telemetry rate now with controlled ventricular rate Abdomen soft nontender Extremities warm dry brisk capillary refill does have dependent edema Neuro awake oriented no focal deficits Urine: Minimal Skin she does have a small DTI, which is purple and discolored and approximately 2 cm round on the left buttocks  Resolved Hospital Problem list :   Postoperative vent management Postop tamponade resolved after relook and washout on day 1 Assessment & Plan:  Severe MR (s/p prior mitral clip x 2 in 2020) now s/p  tissue MVR Moderate TR s/p TV repair c/b Moderate PHTN> progressed to severe postop; PA pressures significantly improved Plan Cont post op care per CVTS Mobilize, IS, PT. OT  Pain ctl  Hemodynamics remain barrier to progression  Glycemic control  Post  bypass vasoplegia and cardioplegia - complicated by sepsis/septic shock c/b NICM, HFrEF, EF 40-45% -source not clear, afebrile, cortisol > 20, wbc trending down on meropenem  but still not sure what source is. Cath tip sent for culture. To date all cultures neg Plan Day 5 of x zyvox  and 3 meropenem   Keep euvolemic Cont midodrine  at 15mg  tid Cont NE for MAP > 65 Has new line. Prob consider line holiday again after we transition off CRRT AND NE needs < 8   Paroxysmal A-flutter, Afib  Back in fib again 1/13, remains in af on 1/14 but w/ controlled ventricular rate  Plan Cont systemic heparin   Changing amiodarone  back to intravenous Cont tele  Mg > 2 K > 4 Warfarin being started per primary team  Postop renal failure w/ worsening hyperkalemia and AGMA 1/13 Off CRRT, got iHD on 1/13. Not able to pull fluid due to hemodynamics Plan Cont CRRT for now  Am chem   Muscular deconditioning and protein calorie malnutrition Plan Cont PT, OOB Cont TF and POs  Elevated t bili.  Slowly improving. US  abd neg, suspect this was passive congestion related from her RV dysfunction, CT neg for GI concern  Plan Monitor, will check LFTs in the morning on 1/16  Difficult IV access Reviewed Dr Nydia Belfast notes from 1/11. Not able to thread right [place] line.  A new right IJ HD catheter was placed on 1/14 Plan Continue to monitor current IV access Eventually she will likely need a tunneled HD cath  Deep tissue injury on left buttocks approximately 2 cm in diameter purple discoloration Plan Continuing to work on optimizing nutrition Fecal management system replaced on 1/14 Best Practice (right click and "Reselect all SmartList Selections" daily)   Diet/type: TF, regular as ordered; not taking in enough DVT prophylaxis systemic heparin  Pressure ulcer(s): None GI prophylaxis: N/A Lines: removed 1/13.  Foley:  Yes, and it is still needed Code Status:  full code Last date of multidisciplinary goals of care  discussion [per primary]  My critical care time is 31 min

## 2023-12-15 NOTE — Plan of Care (Signed)
 Patient tolerated weaning of levophed  from 18 mcg/min to 6 mcg/min as well as net negative fluid removal of almost 1 liter on day shift. Afib/aflutter. Pt stood and pivoted to chair with bedside RN and PT, sat in chair for 2 hours. 2+ assist for transferring out of bed. Oral intake inadequate, however tube feedings at goal rate of 55 mL/hr. No UOP. Flexiseal in place. CRRT circuit patent with no issues.

## 2023-12-15 NOTE — Progress Notes (Signed)
 Physical Therapy Treatment Patient Details Name: Vanessa [name] MRN: [id number] DOB: 1951/11/02 Today's Date: 12/15/2023   History of Present Illness 73 yo F admitted 12/30 with DOE, increasing MR and TR. L[facility] without CAD. 12/30 MVR and tricuspid valve repair. Intubated 1/2-1/5. 1/2 CRRT started. PMhx: HTN, NICM, HFrEF (EF40-45%), Pulmonary HTN, CKD and  severe MR s/p prior mitral clip x 2 in 2020.    PT Comments  Pt with improved standing tolerance and began marching in place in RW with modAx1. Pt continues to be very deconditioned with poor activity tolerance how has improved from yesterday. Pt to continue to benefit from aggressive inpatient rehab program > 3 hrs to address below deficits and progress towards independence. Acute PT to cont to follow.    If plan is discharge home, recommend the following: A lot of help with walking and/or transfers;A lot of help with bathing/dressing/bathroom;Assistance with cooking/housework   Can travel by private vehicle        Equipment Recommendations  Rolling walker (2 wheels);BSC/3in1    Recommendations for Other Services Rehab consult     Precautions / Restrictions Precautions Precautions: Sternal;Fall;Other (comment) Precaution Booklet Issued: No Precaution Comments: cortrak, watch vitals Restrictions Weight Bearing Restrictions Per Provider Order: Yes (Sternal Precautions) RUE Weight Bearing Per Provider Order: Non weight bearing LUE Weight Bearing Per Provider Order: Non weight bearing Other Position/Activity Restrictions: sternal precautions     Mobility  Bed Mobility Overal bed mobility: Needs Assistance Bed Mobility: Supine to Sit     Supine to sit: Mod assist, HOB elevated     General bed mobility comments: pt able to bring LEs to EOB, HOB elevated, pt deferred holding heart pillow, modA for trunk elevation and used bed pad to bring hips to edge    Transfers Overall transfer level: Needs assistance Equipment used:  Rolling walker (2 wheels), 2 person hand held assist Transfers: Sit to/from Stand Sit to Stand: Mod assist, +2 safety/equipment, Max assist           General transfer comment: mod A +2 to rise from bed and stand to allow for bed to be moved and recliner to be brought in behind pt. Pt then stood from chair with mod/maxAx1    Ambulation/Gait               General Gait Details: pt marched in place x 5 reps total in RW, pt with c/o fatigue and immeadiately returning to sit in the chair   Stairs             Wheelchair Mobility     Tilt Bed    Modified Rankin (Stroke Patients Only)       Balance Overall balance assessment: Needs assistance Sitting-balance support: No upper extremity supported, Feet supported Sitting balance-Leahy Scale: Fair Sitting balance - Comments: CGA   Standing balance support: Bilateral upper extremity supported, Reliant on assistive device for balance Standing balance-Leahy Scale: Poor Standing balance comment: RW in standing and external support with fatigue                            Cognition Arousal: Alert Behavior During Therapy: WFL for tasks assessed/performed, Flat affect Overall Cognitive Status: No family/caregiver present to determine baseline cognitive functioning                                 General Comments: pt able to  verbalize precautions but needs cues to adhere functionally, pt able to voice her abilities/weaknesses determining her treatment session        Exercises General Exercises - Lower Extremity Ankle Circles/Pumps: AROM, Both, 20 reps, Seated Quad Sets: AROM, Both, 10 reps, Supine Long Arc Quad: AROM, Both, Seated, 10 reps    General Comments General comments (skin integrity, edema, etc.): CRRT in R side of neck,      Pertinent Vitals/Pain Pain Assessment Pain Assessment: No/denies pain    Home Living                          Prior Function            PT  Goals (current goals can now be found in the care plan section) Acute Rehab PT Goals Patient Stated Goal: return home PT Goal Formulation: With patient Time For Goal Achievement: 12/20/23 Potential to Achieve Goals: Good Progress towards PT goals: Progressing toward goals    Frequency    Min 1X/week      PT Plan      Co-evaluation   Reason for Co-Treatment: Complexity of the patient's impairments (multi-system involvement)          AM-PAC PT "6 Clicks" Mobility   Outcome Measure  Help needed turning from your back to your side while in a flat bed without using bedrails?: A Lot Help needed moving from lying on your back to sitting on the side of a flat bed without using bedrails?: A Lot Help needed moving to and from a bed to a chair (including a wheelchair)?: A Lot Help needed standing up from a chair using your arms (e.g., wheelchair or bedside chair)?: A Lot Help needed to walk in hospital room?: Total Help needed climbing 3-5 steps with a railing? : Total 6 Click Score: 10    End of Session Equipment Utilized During Treatment: Oxygen Activity Tolerance: Patient limited by fatigue Patient left: in bed;with nursing/sitter in room;in chair;with chair alarm set Nurse Communication: Mobility status PT Visit Diagnosis: Other abnormalities of gait and mobility (R26.89);Muscle weakness (generalized) (M62.81);Difficulty in walking, not elsewhere classified (R26.2)     Time: 1610-9604 PT Time Calculation (min) (ACUTE ONLY): 23 min  Charges:    $Therapeutic Exercise: 8-22 mins $Therapeutic Activity: 8-22 mins PT General Charges $$ ACUTE PT VISIT: 1 Visit                     [name], PT, DPT Acute Rehabilitation Services Secure chat preferred Office #: [phone]    [name] 12/15/2023, 1:58 PM

## 2023-12-15 NOTE — Progress Notes (Signed)
 [facility] NEPHROLOGY PROGRESS NOTE  Assessment/ Plan: Pt is a 73 y.o. yo female  with a past medical history HTN, NICM, CHF, pulmonary hypertension, mitral and tricuspid valve disease who present w/ surgery for valvular disease c/b shock, AHRF, and AKI   # Oligo-Anuric AKI on CKD 3A: Multifactorial etiology including cardiorenal syndrome and ischemic ATN due to shock.  Started CRRT on 1/2 for fluid overload.  The CRRT was held on 1/12 because of worsening leukocytosis and poor access with plan to have line holiday. IHD on 1/13 with IDH and no UF. Resumed CRRT on 1/14 to manage volume.  Tolerating well with the help of pressors.  We will aim to reduce UF rate around 100 cc an hour to optimize volume. Continue daily lab and a strict ins and out.    #Multifactorial shock: Vasoplegic and cardioplegia catheter surgery.   - Has been on pressors and inotropes.  Now off milrinone .  Receiving Levophed .     - she is on midodrine   - s/p antibiotics (s/p meropenem  and s/p vanc) and is s/p stress dose steroids.  Resume antibiotics per primary team discretion  - planning for line holiday as above    #Acute exacerbation of heart failure with reduced ejection fraction: Volume overload.  - Optimize volume with renal replacement therapy as above    #Severe MR and tricuspid valvular disease:  - Status post repair.  CT surgery managing   #Postoperative cardiac tamponade:  - Continue monitoring per cardiology   # Hyponatremia: In the setting of AKI, monitor.     #Anemia: Related to blood loss and compounded by AKI and critical illness.    - Transfusions per primary team.   - Started aranesp  40 mcg weekly on Tuesdays   I have discussed with the ICU    Subjective: Seen and examined in ICU.  Tolerating CRRT well however the requirement of Levophed  has gone up to 17 mics.  Patient denies much change in her breathing from yesterday.  No urine output.  No other major changes.    Objective Vital signs in last 24 hours: Vitals:   12/15/23 0830 12/15/23 0845 12/15/23 0900 12/15/23 0915  BP: 100/63 (!) 98/59 101/66 107/66  Pulse: 94 91 93 96  Resp: 18 19 (!) 25 (!) 26  Temp:      TempSrc:      SpO2: 96% 98% 99% 99%  Weight:      Height:       Weight change: 3 kg  Intake/Output Summary (Last 24 hours) at 12/15/2023 0943 Last data filed at 12/15/2023 0900 Gross per 24 hour  Intake 2849.04 ml  Output 3756.2 ml  Net -907.16 ml       Labs: RENAL PANEL Recent Labs  Lab 12/11/23 0106 12/11/23 1458 12/12/23 0215 12/12/23 0939 12/13/23 0303 12/14/23 0240 12/14/23 0752 12/14/23 1602 12/15/23 0251  NA 134*   < > 134*  --  131* 131* 129* 130* 133*  K 4.9   < > 4.4  --  5.3* 4.2 5.2* 4.8 4.3  CL 96*   < > 97*  --  91* 92* 89* 95* 101  CO2 26   < > 25  --  21* 26 24 25 23   GLUCOSE 148*   < > 111*  --  135* 124* 155* 147* 164*  BUN 26*   < > 23  --  56* 36* 46* 36* 25*  CREATININE 1.47*   < > 1.37*  --  3.02*  2.50* 2.79* 2.27* 1.66*  CALCIUM  8.9   < > 8.9  --  9.2 8.6* 8.6* 8.3* 7.7*  MG 2.8*  --  2.8*  --  2.9* 2.2  --   --  2.3  PHOS 3.7   < > 3.6  --  8.3* 5.3* 6.8* 5.0* 3.8  ALBUMIN  3.0*   < > 3.0*   < > 2.7*  2.8* 2.6* 2.5* 2.5* 2.3*   < > = values in this interval not displayed.    Liver Function Tests: Recent Labs  Lab 12/10/23 0339 12/10/23 1635 12/12/23 0939 12/13/23 0303 12/14/23 0240 12/14/23 0752 12/14/23 1602 12/15/23 0251  AST 45*  --  41 39  --   --   --   --   ALT 34  --  44 44  --   --   --   --   ALKPHOS 83  --  76 66  --   --   --   --   BILITOT 13.5*  --  13.0* 11.9*  --   --   --   --   PROT 6.9  --  7.5 7.4  --   --   --   --   ALBUMIN  2.9*  2.9*   < > 2.9* 2.7*  2.8*   < > 2.5* 2.5* 2.3*   < > = values in this interval not displayed.   No results for input(s): "LIPASE", "AMYLASE" in the last 168 hours. No results for input(s): "AMMONIA" in the last 168 hours.  CBC: Recent Labs    12/12/23 0215 12/13/23 0303  12/13/23 1048 12/14/23 0240 12/15/23 0253  HGB 10.4* 10.2* 9.5* 9.2* 8.9*  MCV 94.5 97.1 99.7 96.4 99.3    Cardiac Enzymes: No results for input(s): "CKTOTAL", "CKMB", "CKMBINDEX", "TROPONINI" in the last 168 hours. CBG: Recent Labs  Lab 12/14/23 1614 12/14/23 1944 12/14/23 2330 12/15/23 0325 12/15/23 0737  GLUCAP 125* 134* 112* 144* 144*    Iron  Studies: No results for input(s): "IRON ", "TIBC", "TRANSFERRIN", "FERRITIN" in the last 72 hours. Studies/Results: DG CHEST PORT 1 VIEW Result Date: 12/14/2023 CLINICAL DATA:  Central line placement EXAM: PORTABLE CHEST 1 VIEW COMPARISON:  12/14/2023 FINDINGS: Right dialysis catheter placement with the tip at the cavoatrial junction. No pneumothorax. Cardiomegaly with vascular congestion. Left lower lobe retrocardiac opacity is unchanged. No confluent opacity on the right. IMPRESSION: Right central line placement with the tip at the cavoatrial junction. No pneumothorax. Cardiomegaly, vascular congestion. Stable left lower lobe atelectasis or infiltrate. Electronically Signed   By: Janeece Mechanic M.D.   On: 12/14/2023 10:28   ECHOCARDIOGRAM COMPLETE Result Date: 12/14/2023    ECHOCARDIOGRAM REPORT   Patient Name:   Vanessa [name] Date of Exam: 12/14/2023 Medical Rec #:  [id number]        Height:       63.0 in Accession #:    [id number]       Weight:       181.4 lb Date of Birth:  October 24, 1951         BSA:          1.855 m Patient Age:    72 years         BP:           93/60 mmHg Patient Gender: F                HR:           95 bpm. Exam Location:  Inpatient Procedure: 2D Echo, Color Doppler, Cardiac Doppler and Intracardiac            Opacification Agent Indications:    I31.3 Pericardial effusion  History:        Patient has prior history of Echocardiogram examinations, most                 recent 12/10/2023. CHF, Pulmonary HTN; Risk Factors:Hypertension.                 30mm TriRing T30 placed 11/29/23.                  Mitral Valve: 31 mm Medtronic  bioprosthetic valve valve is                 present in the mitral position. Procedure Date: 11/29/23.  Sonographer:    Sherline Distel Senior RDCS Referring Phys: [id number] LINDSAY NICOLE Cha [facility] IMPRESSIONS  1. Left ventricular ejection fraction, by estimation, is 35 to 40%. The left ventricle has moderately decreased function. The left ventricle demonstrates regional wall motion abnormalities (see scoring diagram/findings for description). There is mild asymmetric left ventricular hypertrophy of the infero-lateral segment. Left ventricular diastolic function could not be evaluated.  2. Right ventricular systolic function is moderately reduced. The right ventricular size is moderately enlarged. There is severely elevated pulmonary artery systolic pressure. The estimated right ventricular systolic pressure is 64.8 mmHg.  3. Left atrial size was moderately dilated.  4. The mitral valve has been repaired/replaced. Trivial mitral valve regurgitation. Moderate mitral stenosis. The mean mitral valve gradient is 6.0 mmHg with average heart rate of 90 bpm. There is a 31 mm Medtronic bioprosthetic valve present in the mitral position. Procedure Date: 11/29/23.  5. TriRing T30 implanted 11/29/23, echo findings are consistent with normal structure and central tricuspid regurgitation. Tricuspid valve gradient 1.4 mm HG at heart rate of 88 bpm. The tricuspid valve is has been repaired/replaced. Tricuspid valve regurgitation is mild to moderate.  6. The aortic valve is tricuspid. Aortic valve regurgitation is trivial. Mild aortic valve stenosis. Aortic valve mean gradient measures 16.0 mmHg. Aortic valve Vmax measures 2.71 m/s.  7. The inferior vena cava is dilated in size with <50% respiratory variability, suggesting right atrial pressure of 15 mmHg. Comparison(s): No prior Echocardiogram. LV Septal bounce and IVC plethora remain. FINDINGS  Left Ventricle: Left ventricular ejection fraction, by estimation, is 35 to 40%. The left ventricle has  moderately decreased function. The left ventricle demonstrates regional wall motion abnormalities. Definity  contrast agent was given IV to delineate the left ventricular endocardial borders. The left ventricular internal cavity size was normal in size. There is mild asymmetric left ventricular hypertrophy of the infero-lateral segment. Left ventricular diastolic function could not be evaluated due to  mitral valve replacement. Left ventricular diastolic function could not be evaluated.  LV Wall Scoring: The basal inferior segment is akinetic. The anterior septum, mid inferoseptal segment, mid inferior segment, and basal inferoseptal segment are hypokinetic. The entire anterior wall, entire lateral wall, and entire apex are normal. Right Ventricle: The right ventricular size is moderately enlarged. No increase in right ventricular wall thickness. Right ventricular systolic function is moderately reduced. There is severely elevated pulmonary artery systolic pressure. The tricuspid regurgitant velocity is 3.53 m/s, and with an assumed right atrial pressure of 15 mmHg, the estimated right ventricular systolic pressure is 64.8 mmHg. Left Atrium: Left atrial size was moderately dilated. Right Atrium: Right atrial size was normal in size. Pericardium: There is no  evidence of pericardial effusion. Mitral Valve: The mitral valve has been repaired/replaced. Trivial mitral valve regurgitation. There is a 31 mm Medtronic bioprosthetic valve present in the mitral position. Procedure Date: 11/29/23. Moderate mitral valve stenosis. MV peak gradient, 14.1  mmHg. The mean mitral valve gradient is 6.0 mmHg with average heart rate of 90 bpm. Tricuspid Valve: TriRing T30 implanted 11/29/23, echo findings are consistent with normal structure and central tricuspid regurgitation. Tricuspid valve gradient 1.4 mm HG at heart rate of 88 bpm. The tricuspid valve is has been repaired/replaced. Tricuspid valve regurgitation is mild to moderate.  No evidence of tricuspid stenosis. Aortic Valve: The aortic valve is tricuspid. Aortic valve regurgitation is trivial. Mild aortic stenosis is present. Aortic valve mean gradient measures 16.0 mmHg. Aortic valve peak gradient measures 29.4 mmHg. Aortic valve area, by VTI measures 0.76 cm. Pulmonic Valve: The pulmonic valve was normal in structure. Pulmonic valve regurgitation is trivial. No evidence of pulmonic stenosis. Aorta: The aortic root and ascending aorta are structurally normal, with no evidence of dilitation. Pulmonary Artery: The pulmonary artery is of normal size. Venous: The inferior vena cava is dilated in size with less than 50% respiratory variability, suggesting right atrial pressure of 15 mmHg. IAS/Shunts: No atrial level shunt detected by color flow Doppler.  LEFT VENTRICLE PLAX 2D LVIDd:         4.70 cm   Diastology LVIDs:         3.80 cm   LV e' medial:    6.37 cm/s LV PW:         1.30 cm   LV E/e' medial:  29.7 LV IVS:        0.90 cm   LV e' lateral:   9.64 cm/s LVOT diam:     1.70 cm   LV E/e' lateral: 19.6 LV SV:         31 LV SV Index:   17 LVOT Area:     2.27 cm  RIGHT VENTRICLE RV S prime:     9.48 cm/s TAPSE (M-mode): 1.0 cm LEFT ATRIUM             Index        RIGHT ATRIUM           Index LA diam:        4.00 cm 2.16 cm/m   RA Area:     12.10 cm LA Vol (A2C):   59.8 ml 32.23 ml/m  RA Volume:   27.00 ml  14.55 ml/m LA Vol (A4C):   80.2 ml 43.23 ml/m LA Biplane Vol: 74.6 ml 40.21 ml/m  AORTIC VALVE AV Area (Vmax):    0.86 cm AV Area (Vmean):   0.86 cm AV Area (VTI):     0.76 cm AV Vmax:           271.00 cm/s AV Vmean:          178.500 cm/s AV VTI:            0.405 m AV Peak Grad:      29.4 mmHg AV Mean Grad:      16.0 mmHg LVOT Vmax:         102.13 cm/s LVOT Vmean:        67.667 cm/s LVOT VTI:          0.136 m LVOT/AV VTI ratio: 0.34  AORTA Ao Root diam: 2.80 cm Ao Asc diam:  3.30 cm MITRAL VALVE  TRICUSPID VALVE MV Area (PHT): 2.07 cm     TV Peak grad:   4.0 mmHg  MV Area VTI:   0.82 cm     TV Mean grad:   1.0 mmHg MV Peak grad:  14.1 mmHg    TV Vmax:        1.00 m/s MV Mean grad:  6.0 mmHg     TV Vmean:       55.3 cm/s MV Vmax:       1.88 m/s     TV VTI:         0.26 msec MV Vmean:      99.6 cm/s    TR Peak grad:   49.8 mmHg MV Decel Time: 366 msec     TR Vmax:        353.00 cm/s MV E velocity: 189.00 cm/s                             SHUNTS                             Systemic VTI:  0.14 m                             Systemic Diam: 1.70 cm Vanessa Larger MD Electronically signed by Vanessa Larger MD Signature Date/Time: 12/14/2023/10:23:48 AM    Final    DG CHEST PORT 1 VIEW Result Date: 12/14/2023 CLINICAL DATA:  Hypoxia EXAM: PORTABLE CHEST 1 VIEW COMPARISON:  12/11/2023 FINDINGS: Cardiomegaly, vascular congestion. Left lower lobe airspace opacity again noted, unchanged. No confluent opacity on the right. No effusions or acute bony abnormality. IMPRESSION: Cardiomegaly. Stable left lower lobe retrocardiac atelectasis or infiltrate. Electronically Signed   By: Janeece Mechanic M.D.   On: 12/14/2023 09:33   CT CHEST ABDOMEN PELVIS WO CONTRAST Result Date: 12/13/2023 CLINICAL DATA:  Sepsis EXAM: CT CHEST, ABDOMEN AND PELVIS WITHOUT CONTRAST TECHNIQUE: Multidetector CT imaging of the chest, abdomen and pelvis was performed following the standard protocol without IV contrast. RADIATION DOSE REDUCTION: This exam was performed according to the departmental dose-optimization program which includes automated exposure control, adjustment of the mA and/or kV according to patient size and/or use of iterative reconstruction technique. COMPARISON:  CT chest dated 11/15/2023. FINDINGS: CT CHEST FINDINGS Cardiovascular: Mild cardiomegaly. Interval mitral and tricuspid valve replacement. Moderate hemorrhagic pericardial effusion, new, with focal 5.3 cm hematoma along the right heart border (coronal image 46) with adjacent tiny foci of pericardial gas (series 3/image 26),  likely postprocedural. Interval placement of a left IJ venous catheter terminating along the right lateral aspect of the lower SVC. No evidence of thoracic aortic aneurysm. Atherosclerotic calcifications of the aortic arch. Mediastinum/Nodes: No suspicious mediastinal lymphadenopathy. Markedly enlarged left thyroid  gland with thyroid  goiter. Lungs/Pleura: Patchy bilateral lower lobe opacities, likely atelectasis. Additional mild dependent atelectasis in the posterior right upper lobe. No focal consolidation. No suspicious pulmonary nodules. No pleural effusion or pneumothorax. Musculoskeletal: Thoracic spine is within normal limits. Median sternotomy. CT ABDOMEN PELVIS FINDINGS Hepatobiliary: Unenhanced liver is unremarkable. Layering gallstones (series 3/image 86), without associated inflammatory changes. Pancreas: Within normal limits. Spleen: Within normal limits. Adrenals/Urinary Tract: Adrenal glands are within normal limits. Right kidney is within normal limits. 1.6 cm simple cyst in the anterior left upper kidney (series 3/image 82), benign (Bosniak I). No follow-up is recommended. No hydronephrosis. Bladder  is underdistended and poorly evaluated. Stomach/Bowel: Enteric tube terminates in the proximal duodenum. No evidence of bowel obstruction. Normal appendix (series 3/image 81). No colonic wall thickening or inflammatory changes. Vascular/Lymphatic: No evidence of abdominal aortic aneurysm. No suspicious abdominopelvic lymphadenopathy. Reproductive: Calcified uterine fibroids. Bilateral ovaries are within normal limits. Other: No abdominopelvic ascites. Musculoskeletal: Mild degenerative changes of the lumbar spine. IMPRESSION: Interval mitral and tricuspid valve replacement. Moderate hemorrhagic pericardial effusion, new, with focal 5.3 cm hematoma along the right heart border, likely postprocedural. Consider echocardiography for further evaluation, as clinically warranted. Patchy bilateral lower lobe  opacities, likely atelectasis. No acute findings in the abdomen/pelvis. Additional ancillary findings as above. Electronically Signed   By: Zadie Herter M.D.   On: 12/13/2023 23:19    Medications: Infusions:   prismasol  BGK 4/2.5 400 mL/hr at 12/14/23 2357    prismasol  BGK 4/2.5 400 mL/hr at 12/15/23 0000   sodium chloride  Stopped (12/11/23 1110)   amiodarone  30 mg/hr (12/15/23 0900)   anticoagulant sodium citrate      feeding supplement (VITAL AF 1.2 CAL) 55 mL/hr at 12/15/23 0900   heparin  1,100 Units/hr (12/15/23 0900)   meropenem  (MERREM ) IV Stopped (12/15/23 0551)   norepinephrine  (LEVOPHED ) Adult infusion 17 mcg/min (12/15/23 0900)   prismasol  BGK 4/2.5 1,500 mL/hr at 12/15/23 0740    Scheduled Medications:  Chlorhexidine  Gluconate Cloth  6 each Topical Daily   darbepoetin (ARANESP ) injection - NON-DIALYSIS  40 mcg Subcutaneous Q Tue-1800   feeding supplement  1 Container Oral TID BM   fiber supplement (BANATROL TF)  60 mL Per Tube TID   Gerhardt's butt cream   Topical BID   influenza vaccine adjuvanted  0.5 mL Intramuscular Tomorrow-1000   insulin  aspart  0-24 Units Subcutaneous Q4H   linezolid   600 mg Per Tube Q12H   midodrine   15 mg Per Tube Q8H   multivitamin  1 tablet Per Tube BID    have reviewed scheduled and prn medications.  Physical Exam: General:NAD, comfortable Heart:RRR, s1s2 nl Lungs: Reduced breath sound bilateral. Abdomen:soft, Non-tender, non-distended Extremities: Peripheral  edema+ Dialysis Access: .  Right IJ temporary HD catheter was replaced by PCCM on 1/14. Vanessa [name] 12/15/2023,9:43 AM  LOS: 16 days

## 2023-12-15 NOTE — Progress Notes (Signed)
 [address]       [place] [address]             [phone]      16 Days Post-Op  Procedure(s) (LRB): EXPLORATION POST OPERATIVE OPEN HEART (N/A)   Total Length of Stay:  LOS: 16 days    SUBJECTIVE: Reports not a good night. Slept 2 hours, uncomfortable. No appetite  Vitals:   12/15/23 0700 12/15/23 0730  BP: (!) 102/54   Pulse: 98   Resp: (!) 23   Temp:  98.3 F (36.8 C)  SpO2: 98%     Intake/Output      01/14 0701 01/15 0700 01/15 0701 01/16 0700   I.V. (mL/kg) 1257 (15.8)    NG/GT 1320    IV Piggyback 299.9    Total Intake(mL/kg) 2876.9 (36.2)    Other     Stool 0    CRRT 3264.2    Total Output 3264.2    Net -387.3         Stool Occurrence 2 x         prismasol  BGK 4/2.5 400 mL/hr at 12/14/23 2357    prismasol  BGK 4/2.5 400 mL/hr at 12/15/23 0000   sodium chloride  5 mL/hr at 12/12/23 0400   sodium chloride  Stopped (12/11/23 1110)   amiodarone  30 mg/hr (12/15/23 0700)   anticoagulant sodium citrate      feeding supplement (VITAL AF 1.2 CAL) 55 mL/hr at 12/15/23 0700   heparin  1,100 Units/hr (12/15/23 0700)   meropenem  (MERREM ) IV Stopped (12/15/23 0551)   norepinephrine  (LEVOPHED ) Adult infusion 18 mcg/min (12/15/23 0700)   prismasol  BGK 4/2.5 1,500 mL/hr at 12/15/23 0740    CBC    Component Value Date/Time   WBC 14.7 (H) 12/15/2023 0253   RBC 2.98 (L) 12/15/2023 0253   HGB 8.9 (L) 12/15/2023 0253   HCT 29.6 (L) 12/15/2023 0253   PLT 111 (L) 12/15/2023 0253   MCV 99.3 12/15/2023 0253   MCH 29.9 12/15/2023 0253   MCHC 30.1 12/15/2023 0253   RDW 22.4 (H) 12/15/2023 0253   LYMPHSABS 2.2 12/09/2023 1001   MONOABS 0.6 12/09/2023 1001   EOSABS 0.0 12/09/2023 1001   BASOSABS 0.2 (H) 12/09/2023 1001   CMP     Component Value Date/Time   NA 133 (L) 12/15/2023 0251   K 4.3 12/15/2023 0251   CL 101 12/15/2023 0251   CO2 23 12/15/2023 0251   GLUCOSE 164 (H) 12/15/2023 0251   BUN 25 (H) 12/15/2023 0251   CREATININE  1.66 (H) 12/15/2023 0251   CREATININE 1.30 (H) 04/29/2016 1144   CALCIUM  7.7 (L) 12/15/2023 0251   PROT 7.4 12/13/2023 0303   ALBUMIN  2.3 (L) 12/15/2023 0251   AST 39 12/13/2023 0303   ALT 44 12/13/2023 0303   ALKPHOS 66 12/13/2023 0303   BILITOT 11.9 (H) 12/13/2023 0303   GFRNONAA 33 (L) 12/15/2023 0251   GFRAA 42 (L) 08/19/2020 1304   ABG    Component Value Date/Time   PHART 7.414 12/05/2023 0948   PCO2ART 37.0 12/05/2023 0948   PO2ART 124 (H) 12/05/2023 0948   HCO3 23.7 12/05/2023 0948   TCO2 25 12/05/2023 0948   ACIDBASEDEF 1.0 12/05/2023 0948   O2SAT 70.3 12/15/2023 0251   CBG (last 3)  Recent Labs    12/14/23 2330 12/15/23 0325 12/15/23 0737  GLUCAP 112* 144* 144*  EXAM Lungs; Overall decreased at bases Card: IRR. No murmur Ext: warm, no edema Neuro: intact  ASSESSMENT: SP MV replacement TV repair Hemodynamics continue to require Levophed . Will consider RHC in a few days if response not improving with volume removal CRRT. Will need to start removing more fluid today Will start coumadin  Cont tube feeds. May go to bolus feeds at night to see if can improve appetite WBC much improved with new lines and antibiotics.    Melene Sportsman, MD 12/15/2023

## 2023-12-15 NOTE — Progress Notes (Addendum)
 Patient ID: Vanessa [name], female   DOB: [birth year], 73 y.o.   MRN: [id number]      Advanced Heart Failure Rounding Note  Cardiologist: Dr. Mitzie Anda  Chief Complaint: Cardiogenic shock  Subjective:   S/P MVR (tissue) and TV ring and same day return to OR for tamponade/bleeding. 1/2: Progressive volume overload with poor UOP, CVVH started but ended up re-intubated.   Extubated 12/05/23 1/12: CVVH stopped 1/13: iHD, HD line removed 1/14 CRRT Restarted.   Overnight Norepi increased to 18 mcg. Pulling ~ 100 per hour  Complaining of fatigue.    Objective:   Weight Range: 79.5 kg Body mass index is 31.05 kg/m.   Vital Signs:   Temp:  [97.6 F (36.4 C)-98.4 F (36.9 C)] 97.6 F (36.4 C) (01/15 0323) Pulse Rate:  [79-109] 98 (01/15 0700) Resp:  [12-35] 23 (01/15 0700) BP: (80-150)/(48-132) 102/54 (01/15 0700) SpO2:  [94 %-100 %] 98 % (01/15 0700) Weight:  [79.5 kg] 79.5 kg (01/15 0545) Last BM Date : 12/14/23  Weight change: Filed Weights   12/13/23 1624 12/14/23 0500 12/15/23 0545  Weight: 76.5 kg 82.3 kg 79.5 kg   Intake/Output:   Intake/Output Summary (Last 24 hours) at 12/15/2023 0704 Last data filed at 12/15/2023 0700 Gross per 24 hour  Intake 2874.93 ml  Output 3264.2 ml  Net -389.27 ml    Physical Exam  General:  Appears weak. No resp difficulty HEENT: [place]  Neck: supple. no JVD. Carotids 2+ bilat; no bruits. No lymphadenopathy or thryomegaly appreciated. RIJ  Cor: PMI nondisplaced. Irregular rate & rhythm. No rubs, gallops or murmurs.Sternal incision  Lungs: Decreased on 2 liters.  Abdomen: soft, nontender, nondistended. No hepatosplenomegaly. No bruits or masses. Good bowel sounds. Extremities: no cyanosis, clubbing, rash, trace edema Neuro: alert & orientedx3, cranial nerves grossly intact. moves all 4 extremities w/o difficulty. Affect flat   Telemetry    A fib 90s   Labs    CBC Recent Labs    12/14/23 0240 12/15/23 0253  WBC 23.3* 14.7*   HGB 9.2* 8.9*  HCT 29.3* 29.6*  MCV 96.4 99.3  PLT 126* 111*   Basic Metabolic Panel Recent Labs    40/98/11 0240 12/14/23 0752 12/14/23 1602 12/15/23 0251  NA 131*   < > 130* 133*  K 4.2   < > 4.8 4.3  CL 92*   < > 95* 101  CO2 26   < > 25 23  GLUCOSE 124*   < > 147* 164*  BUN 36*   < > 36* 25*  CREATININE 2.50*   < > 2.27* 1.66*  CALCIUM  8.6*   < > 8.3* 7.7*  MG 2.2  --   --  2.3  PHOS 5.3*   < > 5.0* 3.8   < > = values in this interval not displayed.   Liver Function Tests Recent Labs    12/12/23 0939 12/13/23 0303 12/14/23 0240 12/14/23 1602 12/15/23 0251  AST 41 39  --   --   --   ALT 44 44  --   --   --   ALKPHOS 76 66  --   --   --   BILITOT 13.0* 11.9*  --   --   --   PROT 7.5 7.4  --   --   --   ALBUMIN  2.9* 2.7*  2.8*   < > 2.5* 2.3*   < > = values in this interval not displayed.   No results for input(s): "LIPASE", "  AMYLASE" in the last 72 hours. Cardiac Enzymes No results for input(s): "CKTOTAL", "CKMB", "CKMBINDEX", "TROPONINI" in the last 72 hours.  BNP: BNP (last 3 results) Recent Labs    05/26/23 1610 08/26/23 1103 09/22/23 0905  BNP 778.0* 781.0* 926.0*   Other results:  Imaging   DG CHEST PORT 1 VIEW Result Date: 12/14/2023 CLINICAL DATA:  Central line placement EXAM: PORTABLE CHEST 1 VIEW COMPARISON:  12/14/2023 FINDINGS: Right dialysis catheter placement with the tip at the cavoatrial junction. No pneumothorax. Cardiomegaly with vascular congestion. Left lower lobe retrocardiac opacity is unchanged. No confluent opacity on the right. IMPRESSION: Right central line placement with the tip at the cavoatrial junction. No pneumothorax. Cardiomegaly, vascular congestion. Stable left lower lobe atelectasis or infiltrate. Electronically Signed   By: Janeece Mechanic M.D.   On: 12/14/2023 10:28   ECHOCARDIOGRAM COMPLETE Result Date: 12/14/2023    ECHOCARDIOGRAM REPORT   Patient Name:   Vanessa [name] Date of Exam: 12/14/2023 Medical Rec #:   [id number]        Height:       63.0 in Accession #:    [id number]       Weight:       181.4 lb Date of Birth:  [birth year]         BSA:          1.855 m Patient Age:    72 years         BP:           93/60 mmHg Patient Gender: F                HR:           95 bpm. Exam Location:  Inpatient Procedure: 2D Echo, Color Doppler, Cardiac Doppler and Intracardiac            Opacification Agent Indications:    I31.3 Pericardial effusion  History:        Patient has prior history of Echocardiogram examinations, most                 recent 12/10/2023. CHF, Pulmonary HTN; Risk Factors:Hypertension.                 30mm TriRing T30 placed 11/29/23.                  Mitral Valve: 31 mm Medtronic bioprosthetic valve valve is                 present in the mitral position. Procedure Date: 11/29/23.  Sonographer:    Sherline Distel Senior RDCS Referring Phys: [id number] LINDSAY NICOLE [facility] IMPRESSIONS  1. Left ventricular ejection fraction, by estimation, is 35 to 40%. The left ventricle has moderately decreased function. The left ventricle demonstrates regional wall motion abnormalities (see scoring diagram/findings for description). There is mild asymmetric left ventricular hypertrophy of the infero-lateral segment. Left ventricular diastolic function could not be evaluated.  2. Right ventricular systolic function is moderately reduced. The right ventricular size is moderately enlarged. There is severely elevated pulmonary artery systolic pressure. The estimated right ventricular systolic pressure is 64.8 mmHg.  3. Left atrial size was moderately dilated.  4. The mitral valve has been repaired/replaced. Trivial mitral valve regurgitation. Moderate mitral stenosis. The mean mitral valve gradient is 6.0 mmHg with average heart rate of 90 bpm. There is a 31 mm Medtronic bioprosthetic valve present in the mitral position. Procedure Date: 11/29/23.  5. TriRing T30 implanted 11/29/23, echo findings are  consistent with normal structure and central  tricuspid regurgitation. Tricuspid valve gradient 1.4 mm HG at heart rate of 88 bpm. The tricuspid valve is has been repaired/replaced. Tricuspid valve regurgitation is mild to moderate.  6. The aortic valve is tricuspid. Aortic valve regurgitation is trivial. Mild aortic valve stenosis. Aortic valve mean gradient measures 16.0 mmHg. Aortic valve Vmax measures 2.71 m/s.  7. The inferior vena cava is dilated in size with <50% respiratory variability, suggesting right atrial pressure of 15 mmHg. Comparison(s): No prior Echocardiogram. LV Septal bounce and IVC plethora remain. FINDINGS  Left Ventricle: Left ventricular ejection fraction, by estimation, is 35 to 40%. The left ventricle has moderately decreased function. The left ventricle demonstrates regional wall motion abnormalities. Definity  contrast agent was given IV to delineate the left ventricular endocardial borders. The left ventricular internal cavity size was normal in size. There is mild asymmetric left ventricular hypertrophy of the infero-lateral segment. Left ventricular diastolic function could not be evaluated due to  mitral valve replacement. Left ventricular diastolic function could not be evaluated.  LV Wall Scoring: The basal inferior segment is akinetic. The anterior septum, mid inferoseptal segment, mid inferior segment, and basal inferoseptal segment are hypokinetic. The entire anterior wall, entire lateral wall, and entire apex are normal. Right Ventricle: The right ventricular size is moderately enlarged. No increase in right ventricular wall thickness. Right ventricular systolic function is moderately reduced. There is severely elevated pulmonary artery systolic pressure. The tricuspid regurgitant velocity is 3.53 m/s, and with an assumed right atrial pressure of 15 mmHg, the estimated right ventricular systolic pressure is 64.8 mmHg. Left Atrium: Left atrial size was moderately dilated. Right Atrium: Right atrial size was normal in size.  Pericardium: There is no evidence of pericardial effusion. Mitral Valve: The mitral valve has been repaired/replaced. Trivial mitral valve regurgitation. There is a 31 mm Medtronic bioprosthetic valve present in the mitral position. Procedure Date: 11/29/23. Moderate mitral valve stenosis. MV peak gradient, 14.1  mmHg. The mean mitral valve gradient is 6.0 mmHg with average heart rate of 90 bpm. Tricuspid Valve: TriRing T30 implanted 11/29/23, echo findings are consistent with normal structure and central tricuspid regurgitation. Tricuspid valve gradient 1.4 mm HG at heart rate of 88 bpm. The tricuspid valve is has been repaired/replaced. Tricuspid valve regurgitation is mild to moderate. No evidence of tricuspid stenosis. Aortic Valve: The aortic valve is tricuspid. Aortic valve regurgitation is trivial. Mild aortic stenosis is present. Aortic valve mean gradient measures 16.0 mmHg. Aortic valve peak gradient measures 29.4 mmHg. Aortic valve area, by VTI measures 0.76 cm. Pulmonic Valve: The pulmonic valve was normal in structure. Pulmonic valve regurgitation is trivial. No evidence of pulmonic stenosis. Aorta: The aortic root and ascending aorta are structurally normal, with no evidence of dilitation. Pulmonary Artery: The pulmonary artery is of normal size. Venous: The inferior vena cava is dilated in size with less than 50% respiratory variability, suggesting right atrial pressure of 15 mmHg. IAS/Shunts: No atrial level shunt detected by color flow Doppler.  LEFT VENTRICLE PLAX 2D LVIDd:         4.70 cm   Diastology LVIDs:         3.80 cm   LV e' medial:    6.37 cm/s LV PW:         1.30 cm   LV E/e' medial:  29.7 LV IVS:        0.90 cm   LV e' lateral:   9.64 cm/s LVOT diam:  1.70 cm   LV E/e' lateral: 19.6 LV SV:         31 LV SV Index:   17 LVOT Area:     2.27 cm  RIGHT VENTRICLE RV S prime:     9.48 cm/s TAPSE (M-mode): 1.0 cm LEFT ATRIUM             Index        RIGHT ATRIUM           Index LA diam:         4.00 cm 2.16 cm/m   RA Area:     12.10 cm LA Vol (A2C):   59.8 ml 32.23 ml/m  RA Volume:   27.00 ml  14.55 ml/m LA Vol (A4C):   80.2 ml 43.23 ml/m LA Biplane Vol: 74.6 ml 40.21 ml/m  AORTIC VALVE AV Area (Vmax):    0.86 cm AV Area (Vmean):   0.86 cm AV Area (VTI):     0.76 cm AV Vmax:           271.00 cm/s AV Vmean:          178.500 cm/s AV VTI:            0.405 m AV Peak Grad:      29.4 mmHg AV Mean Grad:      16.0 mmHg LVOT Vmax:         102.13 cm/s LVOT Vmean:        67.667 cm/s LVOT VTI:          0.136 m LVOT/AV VTI ratio: 0.34  AORTA Ao Root diam: 2.80 cm Ao Asc diam:  3.30 cm MITRAL VALVE                TRICUSPID VALVE MV Area (PHT): 2.07 cm     TV Peak grad:   4.0 mmHg MV Area VTI:   0.82 cm     TV Mean grad:   1.0 mmHg MV Peak grad:  14.1 mmHg    TV Vmax:        1.00 m/s MV Mean grad:  6.0 mmHg     TV Vmean:       55.3 cm/s MV Vmax:       1.88 m/s     TV VTI:         0.26 msec MV Vmean:      99.6 cm/s    TR Peak grad:   49.8 mmHg MV Decel Time: 366 msec     TR Vmax:        353.00 cm/s MV E velocity: 189.00 cm/s                             SHUNTS                             Systemic VTI:  0.14 m                             Systemic Diam: 1.70 cm Gloriann Larger MD Electronically signed by Gloriann Larger MD Signature Date/Time: 12/14/2023/10:23:48 AM    Final         Medications:   Scheduled Medications:  Chlorhexidine  Gluconate Cloth  6 each Topical Daily   Chlorhexidine  Gluconate Cloth  6 each Topical Q0600   darbepoetin (ARANESP ) injection - NON-DIALYSIS  40  mcg Subcutaneous Q Tue-1800   feeding supplement  1 Container Oral TID BM   fiber supplement (BANATROL TF)  60 mL Per Tube TID   Gerhardt's butt cream   Topical BID   influenza vaccine adjuvanted  0.5 mL Intramuscular Tomorrow-1000   insulin  aspart  0-24 Units Subcutaneous Q4H   linezolid   600 mg Per Tube Q12H   midodrine   15 mg Per Tube Q8H   multivitamin  1 tablet Per Tube BID   mouth rinse  15 mL Mouth  Rinse 4 times per day    Infusions:   prismasol  BGK 4/2.5 400 mL/hr at 12/14/23 2357    prismasol  BGK 4/2.5 400 mL/hr at 12/15/23 0000   sodium chloride  5 mL/hr at 12/12/23 0400   sodium chloride  Stopped (12/11/23 1110)   amiodarone  30 mg/hr (12/15/23 0700)   anticoagulant sodium citrate      feeding supplement (VITAL AF 1.2 CAL) 55 mL/hr at 12/15/23 0700   heparin  1,100 Units/hr (12/15/23 0700)   meropenem  (MERREM ) IV Stopped (12/15/23 0551)   norepinephrine  (LEVOPHED ) Adult infusion 18 mcg/min (12/15/23 0700)   prismasol  BGK 4/2.5 1,500 mL/hr at 12/15/23 0417    PRN Medications: sodium chloride , sodium chloride , alteplase , anticoagulant sodium citrate , artificial tears, bisacodyl  **OR** bisacodyl , docusate, heparin , heparin , HYDROmorphone  (DILAUDID ) injection, levalbuterol , lidocaine  (PF), lidocaine -prilocaine , ondansetron  (ZOFRAN ) IV, mouth rinse, oxyCODONE , pentafluoroprop-tetrafluoroeth, pneumococcal 20-valent conjugate vaccine, sodium chloride , traZODone   Patient Profile   AHMIAH BIDGOOD is a 73 y.o. with history of nonischemic cardiomyopathy and severe MR s/p Mitral Clip x2 (2020), pHTN, and CKD IIIb.   Assessment/Plan   1. Post Cardiotomy Cardiogenic & septic shock: Bioprosthetic MVR and tricuspid ring 11/29/23 with Dr. Honey Lusty. Pre-op cath with no significant CAD.  Pre-op echo with EF 40-45%, severe MR. Went back to the OR with post-op tamponade for clean-out.  Echo 12/31 showed EF 25-30%, moderate RV dysfunction, s/p bioprosthetic mitral valve replacement with trivial MR and tricuspid valve repair with trivial TR. Worsened on 1/2 and re-intubated in setting of volume overload, oliguric renal failure &  pulmonary hypertension. Extubated on 12/04/22. Progressive renal failure requiring CRRT for volume removal.  Now off CVVH on 1/12.  CO-OX 70%.   Pressor requirements increased overnight. Norepi increased to 18 mcg. Try to slowly wean.  - CVP 2-3. On CRRT.  - Continue midodrine  15  tid.  - 1/13 CT C/A/P  moderate hemorrhagic pericardial effusion and 5.3 cm hematoma right heart border.  Repeat Echo no tamponade.  2. Mitral regurgitation:  TEE in 11/19 with severe MR, restricted posterior leaflet.  She had Mitraclip x 2 in 1/20. Post-op echo showed mild MR and mild mitral stenosis.   Echo in 9/24 showed MV s/p Mitraclip x 2 with mean gradient 6 mmHg and severe mitral regurgitation. TEE was then done to evaluate, showing 2 Mitraclips in place with mean gradient 4 mmHg and severe eccentric MR from between the 2 clips with ERO 0.54 cm^2.  She was not thought to be a candidate for an additional Mitraclip due to lack of room between the two existing clips. R/LHC (11/24) showed moderate mixed pulmonary arterial and pulmonary venous hypertension likely due to mitral valve regurgitation.  S/p MV replacement and Tricuspid repair on 11/29/23 with Dr. Honey Lusty.  -Echo stable  3. Ventricular ectopy:  Continue amio drip. Aaron Aas 4. Pulmonary hypertension: Most recent pre-op RHC (11/24) with PVR 4.5, with moderate mixed pulmonary arterial and pulmonary venous hypertension likely due to mitral valve regurgitation. Now s/p MVR, PA pressure  severely elevated post-op.  Suspect still mixed PAH/PVH with elevated PCWP.  NO was tried at 10 ppm but did not have much effect so now off.  Will hold off on pulmonary vasodilators; small clinical trials demonstrate no improvement and some markers of harm with PDE5i in PH in the setting of mitral valve disease.  5. Toxic multinodular goiter: She is now off methimazole . TSH was normal in 8/24. Follows with Endocrinology 6. AKI on CKD stage 3:  -1/14 CRRT restarted  7. Blood loss anemia - Hgb stable.  8. Complete heart block: She had post-op CHB. Resolved. Afib/flutter today 9. Hypoxic / Hypercapnic respiratory failure:  - Sats stable on 2 liters.  10. Atrial fibrillation:  Remains in A fib. Continue amio drip. Rate controlled. On Heparin  drip.  11. ID: Completed 7  day course of vanc/merrem  with no obvious source of infection. 1/9 abdominal US  no evidence for cholecystitis.  WBCs down . 14.7.  ON linezolid  + meropenem .  12. Thrombocytopenia: Mild.  - Platelets  drifting down 126>111 13. Deconditioning - will need extensive PT.  - OOB today.  14. FEN: Getting tube feeds  Discussed with CCM.    Amy Clegg NP-C  12/15/2023 7:04 AM  Patient seen with NP, agree with the above note.   Echo yesterday reviewed, stable from prior.  There was fibrinous material around the heart but no tamponade.   Currently pulling net negative UF 150 cc/hr via CVVH.  CVP 7 on my read.  Co-ox 70%, she is on NE 18.   She is in rate-controlled atrial fibrillation on amiodarone  gtt and heparin  gtt.   WBCs trending down, now with new HD catheter.  On meropenem  + linezolid .   General: NAD Neck: No JVD, no thyromegaly or thyroid  nodule.  Lungs: Decreased at bases.  CV: Nondisplaced PMI.  Heart irregular S1/S2, no S3/S4, no murmur.  Trace ankle edema.  Abdomen: Soft, nontender, no hepatosplenomegaly, no distention.  Skin: Intact without lesions or rashes.  Neurologic: Alert and oriented x 3.  Psych: Normal affect. Extremities: No clubbing or cyanosis.  HEENT: Normal.   Suspect combined septic/cardiogenic shock.  WBCs trending down since lines removed/changed.  Still on NE 18.  CVP not elevated and weight down.  - Would be more gentle with CVVH today, aim for net 50-100 cc/hr UF and can wean NE for SBP > 90.  - Continue midodrine    Broad spectrum abx to continue.   She remains in rate-controlled AF. Plts trending down, ?due to development of sepsis.  If continue down, will need to continue HIT.  She is on heparin  gtt.   Mobilize with PT.   CRITICAL CARE Performed by: Peder Bourdon  Total critical care time: 40 minutes  Critical care time was exclusive of separately billable procedures and treating other patients.  Critical care was necessary to treat or  prevent imminent or life-threatening deterioration.  Critical care was time spent personally by me on the following activities: development of treatment plan with patient and/or surrogate as well as nursing, discussions with consultants, evaluation of patient's response to treatment, examination of patient, obtaining history from patient or surrogate, ordering and performing treatments and interventions, ordering and review of laboratory studies, ordering and review of radiographic studies, pulse oximetry and re-evaluation of patient's condition.  Peder Bourdon 12/15/2023 8:58 AM

## 2023-12-15 NOTE — Progress Notes (Addendum)
 PHARMACY - ANTICOAGULATION CONSULT NOTE  Pharmacy Consult for IV heparin  transition to warfarin Indication: atrial fibrillation  No Known Allergies  Patient Measurements: Height: 5\' 3"  (160 cm) Weight: 79.5 kg (175 lb 4.3 oz) IBW/kg (Calculated) : 52.4 Heparin  Dosing Weight: ~ 70 kg  Vital Signs: Temp: 98.3 F (36.8 C) (01/15 0730) Temp Source: Oral (01/15 0730) BP: 90/77 (01/15 1045) Pulse Rate: 90 (01/15 1045)  Labs: Recent Labs    12/13/23 0303 12/13/23 1048 12/14/23 0240 12/14/23 0752 12/14/23 1602 12/15/23 0251 12/15/23 0253  HGB 10.2* 9.5* 9.2*  --   --   --  8.9*  HCT 33.2* 31.6* 29.3*  --   --   --  29.6*  PLT 152 141* 126*  --   --   --  111*  APTT 72*  --  82*  --   --  82*  --   HEPARINUNFRC 0.37  --  0.39  --   --  0.38  --   CREATININE 3.02*  --  2.50* 2.79* 2.27* 1.66*  --     Estimated Creatinine Clearance: 30.6 mL/min (A) (by C-G formula based on SCr of 1.66 mg/dL (H)).   Assessment: 73 yo female s/p scheduled bioprosthetic mitral valve replacement with tissue valve 12/30 now with afib, pharmacy asked to start anticoagulation with IV heparin . Spontaneously converted 1/7; flipped back to AF overnight 1/9 0300. Subsequently given amio bolus & gtt to 60 1/9; converted back to NSR 1/9 1900.  aPTT 82 sec therapeutic on 1100 unit/hr. Heparin  running in PIV now, with new HD cath in place. Tbili improved from 13 to 11. Will continue to utilize aPTT given unexpected heparin  levels 1/12. CBC stable and no issues with heparin  infusion at this time per RN. Still requiring vasopressors this morning for RRT. Baseline INR today, 1/15 1.1, albumin  persistently low ~2.3-2.5.  Goal of Therapy:  Heparin  level 0.3-0.5 units/ml aPTT 66-85 sec INR goal 2 - 3 (bMVR) Monitor platelets by anticoagulation protocol: Yes   Plan:  Continue heparin  at 1100 units/hr  Initiate warfarin 2.5 mg x1, no standing regimen at this time until patient response can be assessed Monitor  aPTT, HL, and CBC daily and PRN Daily PT/INR Monitor for s/sx bleeding  Heddy Liverpool, PharmD PGY2 Critical Care Pharmacy Resident 12/15/2023 11:03 AM

## 2023-12-16 DIAGNOSIS — I272 Pulmonary hypertension, unspecified: Secondary | ICD-10-CM | POA: Diagnosis not present

## 2023-12-16 DIAGNOSIS — R579 Shock, unspecified: Secondary | ICD-10-CM

## 2023-12-16 DIAGNOSIS — R5381 Other malaise: Secondary | ICD-10-CM

## 2023-12-16 DIAGNOSIS — Z952 Presence of prosthetic heart valve: Secondary | ICD-10-CM | POA: Diagnosis not present

## 2023-12-16 DIAGNOSIS — N179 Acute kidney failure, unspecified: Secondary | ICD-10-CM

## 2023-12-16 DIAGNOSIS — N183 Chronic kidney disease, stage 3 unspecified: Secondary | ICD-10-CM | POA: Insufficient documentation

## 2023-12-16 DIAGNOSIS — I340 Nonrheumatic mitral (valve) insufficiency: Secondary | ICD-10-CM | POA: Diagnosis not present

## 2023-12-16 DIAGNOSIS — R570 Cardiogenic shock: Secondary | ICD-10-CM | POA: Diagnosis not present

## 2023-12-16 LAB — RENAL FUNCTION PANEL
Albumin: 2.6 g/dL — ABNORMAL LOW (ref 3.5–5.0)
Albumin: 2.9 g/dL — ABNORMAL LOW (ref 3.5–5.0)
Anion gap: 9 (ref 5–15)
Anion gap: 10 (ref 5–15)
BUN: 22 mg/dL (ref 8–23)
BUN: 22 mg/dL (ref 8–23)
CO2: 26 mmol/L (ref 22–32)
CO2: 24 mmol/L (ref 22–32)
Calcium: 8.3 mg/dL — ABNORMAL LOW (ref 8.9–10.3)
Calcium: 8.9 mg/dL (ref 8.9–10.3)
Chloride: 99 mmol/L (ref 98–111)
Chloride: 99 mmol/L (ref 98–111)
Creatinine, Ser: 1.5 mg/dL — ABNORMAL HIGH (ref 0.44–1.00)
Creatinine, Ser: 1.22 mg/dL — ABNORMAL HIGH (ref 0.44–1.00)
GFR, Estimated: 37 mL/min — ABNORMAL LOW (ref >60)
GFR, Estimated: 47 mL/min — ABNORMAL LOW (ref >60)
Glucose, Bld: 157 mg/dL — ABNORMAL HIGH (ref 70–99)
Glucose, Bld: 169 mg/dL — ABNORMAL HIGH (ref 70–99)
Phosphorus: 3.3 mg/dL (ref 2.5–4.6)
Phosphorus: 2.8 mg/dL (ref 2.5–4.6)
Potassium: 4.9 mmol/L (ref 3.5–5.1)
Potassium: 5.2 mmol/L — ABNORMAL HIGH (ref 3.5–5.1)
Sodium: 134 mmol/L — ABNORMAL LOW (ref 135–145)
Sodium: 133 mmol/L — ABNORMAL LOW (ref 135–145)

## 2023-12-16 LAB — MAGNESIUM: Magnesium: 2.6 mg/dL — ABNORMAL HIGH (ref 1.7–2.4)

## 2023-12-16 LAB — APTT
aPTT: 98 s — ABNORMAL HIGH (ref 24–36)
aPTT: 74 s — ABNORMAL HIGH (ref 24–36)

## 2023-12-16 LAB — COOXEMETRY PANEL
Carboxyhemoglobin: 3 % — ABNORMAL HIGH (ref 0.5–1.5)
Methemoglobin: <0.7 % (ref 0.0–1.5)
O2 Saturation: 61.1 %
Total hemoglobin: 9.6 g/dL — ABNORMAL LOW (ref 12.0–16.0)

## 2023-12-16 LAB — CBC
HCT: 31.3 % — ABNORMAL LOW (ref 36.0–46.0)
Hemoglobin: 9.5 g/dL — ABNORMAL LOW (ref 12.0–15.0)
MCH: 30.8 pg (ref 26.0–34.0)
MCHC: 30.4 g/dL (ref 30.0–36.0)
MCV: 101.6 fL — ABNORMAL HIGH (ref 80.0–100.0)
Platelets: 89 10*3/uL — ABNORMAL LOW (ref 150–400)
RBC: 3.08 MIL/uL — ABNORMAL LOW (ref 3.87–5.11)
RDW: 23.2 % — ABNORMAL HIGH (ref 11.5–15.5)
WBC: 15.5 10*3/uL — ABNORMAL HIGH (ref 4.0–10.5)
nRBC: 0.8 % — ABNORMAL HIGH (ref 0.0–0.2)

## 2023-12-16 LAB — HEPATIC FUNCTION PANEL
ALT: 45 U/L — ABNORMAL HIGH (ref 0–44)
AST: 35 U/L (ref 15–41)
Albumin: 2.6 g/dL — ABNORMAL LOW (ref 3.5–5.0)
Alkaline Phosphatase: 64 U/L (ref 38–126)
Bilirubin, Direct: 4.6 mg/dL — ABNORMAL HIGH (ref 0.0–0.2)
Indirect Bilirubin: 2.8 mg/dL — ABNORMAL HIGH (ref 0.3–0.9)
Total Bilirubin: 7.4 mg/dL — ABNORMAL HIGH (ref 0.0–1.2)
Total Protein: 7 g/dL (ref 6.5–8.1)

## 2023-12-16 LAB — GLUCOSE, CAPILLARY
Glucose-Capillary: 133 mg/dL — ABNORMAL HIGH (ref 70–99)
Glucose-Capillary: 132 mg/dL — ABNORMAL HIGH (ref 70–99)
Glucose-Capillary: 135 mg/dL — ABNORMAL HIGH (ref 70–99)
Glucose-Capillary: 134 mg/dL — ABNORMAL HIGH (ref 70–99)
Glucose-Capillary: 137 mg/dL — ABNORMAL HIGH (ref 70–99)
Glucose-Capillary: 131 mg/dL — ABNORMAL HIGH (ref 70–99)

## 2023-12-16 LAB — CULTURE, RESPIRATORY W GRAM STAIN: Gram Stain: NONE SEEN

## 2023-12-16 LAB — HEPARIN LEVEL (UNFRACTIONATED)
Heparin Unfractionated: 0.53 [IU]/mL (ref 0.30–0.70)
Heparin Unfractionated: 0.46 [IU]/mL (ref 0.30–0.70)

## 2023-12-16 LAB — PROTIME-INR
INR: 1.1 (ref 0.8–1.2)
Prothrombin Time: 14.9 s (ref 11.4–15.2)

## 2023-12-16 MED ORDER — WARFARIN SODIUM 2.5 MG PO TABS
2.5000 mg | ORAL_TABLET | Freq: Once | ORAL | Status: AC
  Administered 2023-12-16: 2.5 mg via ORAL
  Filled 2023-12-16: qty 1

## 2023-12-16 NOTE — Progress Notes (Signed)
Pharmacy Antibiotic Note  EVALIN SAGRERO is a 73 y.o. female admitted on 11/29/2023 for valve repair, now w/ concern for sepsis given pressor requirements and leukocytosis. Patient has not improved despite full courses of vancomycin + meropenem. Pharmacy has been consulted for linezolid and Zosyn dosing.  Pt on CRRT. Discussed with CCM, line removed on 1/14 however patient is still requiring vasopressors with resumption of CVVHD. Unclear source of infection at this time.  Previous antibiotics: Cefazolin 12/30>1/1 Vancomycin 1/3 > 1/9 Merrem 1/3 > 1/9  Culture/Panel Data: SARS-Cov-2: negative Bcx 1/13: negative 2/2 Trach Aspirate 1/13: rare yeast Urine catheter 1/14: no growth at 1 day  Plan: Linezolid 600 mg PO every 12 hours Meropenem 1 g IV every 8 hours F/u CRRT plans, currently pulling for volume status Per CCM, may consider stop date once NE < 8 and off CRRT with line holiday.  Height: 5\' 3"  (160 cm) Weight: 78.5 kg (173 lb 1 oz) IBW/kg (Calculated) : 52.4  Temp (24hrs), Avg:98 F (36.7 C), Min:97.6 F (36.4 C), Max:98.4 F (36.9 C)  Recent Labs  Lab 12/13/23 0303 12/13/23 1048 12/14/23 0240 12/14/23 0752 12/14/23 1602 12/15/23 0251 12/15/23 0253 12/15/23 1608 12/16/23 0415 12/16/23 0417  WBC 26.9* 29.1* 23.3*  --   --   --  14.7*  --   --  15.5*  CREATININE 3.02*  --  2.50* 2.79* 2.27* 1.66*  --  1.48* 1.50*  --     Estimated Creatinine Clearance: 33.6 mL/min (A) (by C-G formula based on SCr of 1.5 mg/dL (H)).    No Known Allergies  Thank you for allowing pharmacy to be a part of this patient's care.  Rutherford Nail, PharmD PGY2 Critical Care Pharmacy Resident 12/16/2023 6:47 AM

## 2023-12-16 NOTE — Progress Notes (Signed)
[address]       [place] [address]             [phone]      17 Days Post-Op  Procedure(s) (LRB): EXPLORATION POST OPERATIVE OPEN HEART (N/A)   Total Length of Stay:  LOS: 17 days    SUBJECTIVE:  Vitals:   12/16/23 0700 12/16/23 0730  BP: 92/61 (!) 78/54  Pulse: 80 68  Resp: (!) 24 (!) 24  Temp:  98.1 F (36.7 C)  SpO2: 94% 95%    Intake/Output      01/15 0701 01/16 0700 01/16 0701 01/17 0700   P.O. 90    I.V. (mL/kg) 842.9 (10.7)    Other 60    NG/GT 1670    IV Piggyback 300.2    Total Intake(mL/kg) 2963.1 (37.7)    Stool 318    CRRT 4633.1    Total Output 4951.1    Net -1988              prismasol BGK 4/2.5 400 mL/hr at 12/16/23 0212    prismasol BGK 4/2.5 400 mL/hr at 12/16/23 0214   sodium chloride Stopped (12/11/23 1110)   amiodarone 30 mg/hr (12/16/23 0700)   anticoagulant sodium citrate     feeding supplement (VITAL AF 1.2 CAL) 55 mL/hr at 12/16/23 0700   heparin 1,100 Units/hr (12/16/23 0700)   meropenem (MERREM) IV Stopped (12/16/23 7829)   norepinephrine (LEVOPHED) Adult infusion 5 mcg/min (12/16/23 0700)   prismasol BGK 4/2.5 1,500 mL/hr at 12/16/23 0132    CBC    Component Value Date/Time   WBC 15.5 (H) 12/16/2023 0417   RBC 3.08 (L) 12/16/2023 0417   HGB 9.5 (L) 12/16/2023 0417   HCT 31.3 (L) 12/16/2023 0417   PLT 89 (L) 12/16/2023 0417   MCV 101.6 (H) 12/16/2023 0417   MCH 30.8 12/16/2023 0417   MCHC 30.4 12/16/2023 0417   RDW 23.2 (H) 12/16/2023 0417   LYMPHSABS 2.2 12/09/2023 1001   MONOABS 0.6 12/09/2023 1001   EOSABS 0.0 12/09/2023 1001   BASOSABS 0.2 (H) 12/09/2023 1001   CMP     Component Value Date/Time   NA 134 (L) 12/16/2023 0415   K 4.9 12/16/2023 0415   CL 99 12/16/2023 0415   CO2 26 12/16/2023 0415   GLUCOSE 157 (H) 12/16/2023 0415   BUN 22 12/16/2023 0415   CREATININE 1.50 (H) 12/16/2023 0415   CREATININE 1.30 (H) 04/29/2016 1144   CALCIUM 8.3 (L) 12/16/2023 0415   PROT 7.0  12/16/2023 0417   ALBUMIN 2.6 (L) 12/16/2023 0417   AST 35 12/16/2023 0417   ALT 45 (H) 12/16/2023 0417   ALKPHOS 64 12/16/2023 0417   BILITOT 7.4 (H) 12/16/2023 0417   GFRNONAA 37 (L) 12/16/2023 0415   GFRAA 42 (L) 08/19/2020 1304   ABG    Component Value Date/Time   PHART 7.414 12/05/2023 0948   PCO2ART 37.0 12/05/2023 0948   PO2ART 124 (H) 12/05/2023 0948   HCO3 23.7 12/05/2023 0948   TCO2 25 12/05/2023 0948   ACIDBASEDEF 1.0 12/05/2023 0948   O2SAT 61.1 12/16/2023 0414   CBG (last 3)  Recent Labs    12/15/23 2324 12/16/23 0307 12/16/23 0743  GLUCAP 133* 133* 132*  EXAM  unchanged  ASSESSMENT: SP MV replacement and TV repair Has had improvement with CRRT with less intotropic support Continue CRRT  Continue coumadin PT Consider bolus feeds    Eugenio Hoes, MD 12/16/2023

## 2023-12-16 NOTE — Progress Notes (Signed)
Patient ID: Vanessa [name], female   DOB: Oct 27, 1951, 73 y.o.   MRN: [id number]  TCTS Evening Rounds:  Hemodynamically stable on NE 6.  Continues on CRRT to remove 50-100/hr.  BMET    Component Value Date/Time   NA 133 (L) 12/16/2023 1601   K 5.2 (H) 12/16/2023 1601   CL 99 12/16/2023 1601   CO2 24 12/16/2023 1601   GLUCOSE 169 (H) 12/16/2023 1601   BUN 22 12/16/2023 1601   CREATININE 1.22 (H) 12/16/2023 1601   CREATININE 1.30 (H) 04/29/2016 1144   CALCIUM 8.9 12/16/2023 1601   EGFR 35.0 07/15/2023 1433   GFRNONAA 47 (L) 12/16/2023 1601     Continuous tube feeds.  Was up in chair for a little while today. Very weak per nurse.

## 2023-12-16 NOTE — Progress Notes (Signed)
OT Cancellation Note  Patient Details Name: Vanessa [name] MRN: [id number] DOB: Apr 03, 1951   Cancelled Treatment:    Reason Eval/Treat Not Completed: Fatigue/lethargy limiting ability to participate (Attempted to see pt for OT session x2. Per discussions with RN, pt not appropriate to participate in OT session this day due to fatigue. OT to reattempt to see pt at a later date as appropriate/available.)  Sundus Pete "Orson Eva., OTR/L, MA Acute Rehab [phone]  Lendon Colonel 12/16/2023, 4:59 PM

## 2023-12-16 NOTE — Progress Notes (Signed)
PHARMACY - ANTICOAGULATION CONSULT NOTE  Pharmacy Consult for IV heparin transition to warfarin Indication: atrial fibrillation  No Known Allergies  Patient Measurements: Height: 5\' 3"  (160 cm) Weight: 78.5 kg (173 lb 1 oz) IBW/kg (Calculated) : 52.4 Heparin Dosing Weight: ~ 70 kg  Vital Signs: Temp: 97.6 F (36.4 C) (01/16 0309) Temp Source: Oral (01/16 0309) BP: 92/81 (01/16 0615) Pulse Rate: 83 (01/16 0615)  Labs: Recent Labs    12/14/23 0240 12/14/23 0752 12/15/23 0251 12/15/23 0253 12/15/23 1118 12/15/23 1608 12/16/23 0415 12/16/23 0417  HGB 9.2*  --   --  8.9*  --   --   --  9.5*  HCT 29.3*  --   --  29.6*  --   --   --  31.3*  PLT 126*  --   --  111*  --   --   --  89*  APTT 82*  --  82*  --   --   --   --  98*  LABPROT  --   --   --   --  14.7  --   --  14.9  INR  --   --   --   --  1.1  --   --  1.1  HEPARINUNFRC 0.39  --  0.38  --   --   --  0.53  --   CREATININE 2.50*   < > 1.66*  --   --  1.48* 1.50*  --    < > = values in this interval not displayed.    Estimated Creatinine Clearance: 33.6 mL/min (A) (by C-G formula based on SCr of 1.5 mg/dL (H)).   Assessment: 73 yo female s/p scheduled bioprosthetic mitral valve replacement with tissue valve 12/30 now with afib, pharmacy asked to start anticoagulation with IV heparin. Spontaneously converted 1/7; flipped back to AF overnight 1/9 0300. Subsequently given amio bolus & gtt to 60 1/9; converted back to NSR 1/9 1900. Heparin running in PIV now, with new HD cath in place. Tbili improved from 13 to 11. Will continue to utilize aPTT at this time, levels are not correlating. CBC stable and no issues with heparin infusion at this time per RN. Still requiring vasopressors this morning for RRT. Baseline INR on 1/15 of 1.1, albumin persistently low ~2.3-2.5.  aPTT supratherapeutic at 98, INR subtherapeutic at 1.1. Platelets trending down, 4T score low risk for HIT with baseline thrombocytopenia. No issues with  infusion or pauses in therapy. No new s/sx bleeding.  Goal of Therapy:  Heparin level 0.3-0.5 units/ml aPTT 66-85 sec INR goal 2 - 3 (bMVR) Monitor platelets by anticoagulation protocol: Yes   Plan:  Decrease heparin to 950 units/hr (approximately 2 u/kg/h) Warfarin 2.5 mg x1, no standing regimen at this time until patient response can be assessed Monitor aPTT, HL, and CBC daily and PRN Daily PT/INR Monitor for s/sx bleeding  Rutherford Nail, PharmD PGY2 Critical Care Pharmacy Resident 12/16/2023 6:53 AM

## 2023-12-16 NOTE — Plan of Care (Signed)
  Problem: Education: Goal: Knowledge of General Education information will improve Description Including pain rating scale, medication(s)/side effects and non-pharmacologic comfort measures Outcome: Progressing   

## 2023-12-16 NOTE — Progress Notes (Signed)
NAME:  Vanessa [name], MRN:  [id number], DOB:  1950/12/08, LOS: 17 ADMISSION DATE:  11/29/2023, CONSULTATION DATE:  11/29/23 REFERRING MD:  Dr. Leafy Ro, CHIEF COMPLAINT:  postop   History of Present Illness:  72yoF with PMH significant for HTN, NICM, HFrEF (EF40-45%), PHTN, CKD and hx of prior severe MR s/p prior mitral clip x 2 in 2020.  Pt with progressive DOE found to have increasing MR  and moderate TR despite adjustments in GDMT.  Not felt to be candidate for additional mitral clip.  Repeat LHC without CAD but showed moderate PHTN.  Underwent mitral valve replacement with tissue valve and tricuspid valve repair by Dr. Leafy Ro on 12/30.  PCCM consulted to assist with vent and medical management post-operatively in ICU.   Pertinent  Medical History  Never smoker, MR w/ previous Mitral clip x 2 (2020), NICM, HFrEF, PHTN, HTN, CKD3b  Significant Hospital Events: Including procedures, antibiotic start and stop dates in addition to other pertinent events   MVR by Dr. Leafy Ro; relook for tamponade/washout later in evening without obvious source 1/2 intubated for respiratory decompensation, iNO started. Vanc, meropenem started. CRRT started.  1/5 extubated 1/8 converted to NSR overnight 1/9 back to A.fib around 0200 1/11 R introducer out.  Unable to thread right subclavian catheter 1/12 remains weak still pressor dependent 1/13 repeat [facility] sent. Still on pressors..,  Zosyn changed to meropenem.  Chest abdomen and pelvis CT obtained : Moderate hemorrhagic pericardial effusion, 5.3 cm hematoma right thrombus. Arterial gas bilateral patchy opacities mild dependent posterior right upper lobe atelectasis was unremarkable layering gallstones  1/14 new right IJ HD catheter placed.  The echocardiogram Was obtained to better evaluate her EF is 35 to 40%.  The LV demonstrates regional wall motion abnormalities there is asymmetric left ventricular hypertrophy of the inferior lateral segment RV systolic  function moderate regular reduced left atrial wall dilated no mention of clot.  1/15 still on fairly high-dose norepinephrine 1/16 cont CRRT + low dose NE  Interim History / Subjective:  Pulling 50-80 overnight   On 5 NE   Resistant to idea of getting out of bed today, says she was out of bedf for over 2 hours 1/15   Objective   Blood pressure 103/63, pulse 82, temperature 98.1 F (36.7 C), temperature source Axillary, resp. rate (!) 23, height 5\' 3"  (1.6 m), weight 78.5 kg, SpO2 95%. CVP:  [1 mmHg-5 mmHg] 5 mmHg      Intake/Output Summary (Last 24 hours) at 12/16/2023 1040 Last data filed at 12/16/2023 1000 Gross per 24 hour  Intake 2876.98 ml  Output 4940.1 ml  Net -2063.12 ml   Filed Weights   12/14/23 0500 12/15/23 0545 12/16/23 0500  Weight: 82.3 kg 79.5 kg 78.5 kg   Examination:  General: ill appearing elderly F NAD  HEENT: NCAT pink mm anicteric sclera  Pulmonary: Even unlabored. Symmetrical chest expansion. Clear   Cardiac : irreg rhythm, cap refill < 3 sec. + murmur   Abdomen: soft ndnt  Extremities: dependent edema. No acute joint deformity  Neuro: awake oriented, following commands  Skin sternal surgical site c/d/ well approximated   Resolved Hospital Problem list :   Postoperative vent management Postop tamponade resolved after relook and washout on day 1 Assessment & Plan:   Sev Mitral regurg (s/p prior mitral clip in 2020) s/p MVR Mod Tricuspid regurg s/p TV repair Shock: post bypass vasoplegia +/- septic shock  Moderate pHTN Chronic HFrEF  NICM  pAF, flutter  Plan -  post op per CVTS -cont NE for MAP > 65 -net neg via CRRT as below  -sepsis as below   Sepsis unclear source  Plan -cont mero linezolid -when off CRRT consider line holiday (if pressors allow)  -follow fever curve white count   Afib  Plan -Amio -changing from hep gtt to warfarin   Acute renal failure Plan -CRRT for now, goal 50-75 off/hr   Protein calorie  malnutrition Physical deconditioning  Plan -EN per RDN -- she is overall very disinterested in eating which is presenting a challenge for progressing her nutr goals. -PT/OT   Hyperbilirubinemia  -probably congestion related; [place] was assuring and LFTs are ok  Plan -follow labs PRN   Difficult IV access Reviewed Dr Lonn [place] notes from 1/11. Not able to thread right [place] line.  A new right IJ HD catheter was placed on 1/14 Plan Continue to monitor current IV access Eventually she will likely need a tunneled HD cath  Deep tissue injury on left buttocks approximately 2 cm in diameter purple discoloration Plan Continuing to work on optimizing nutrition Fecal management system replaced on 1/14  Best Practice (right click and "Reselect all SmartList Selections" daily)   Diet/type: TF, regular as ordered; not taking in enough DVT prophylaxis systemic heparin --> warfarin  Pressure ulcer(s): None GI prophylaxis: N/A Lines: HD cath  Foley:  N/A Code Status:  full code Last date of multidisciplinary goals of care discussion [per primary]   CRITICAL CARE Performed by: Lanier Clam   Total critical care time: 37 minutes  Critical care time was exclusive of separately billable procedures and treating other patients. Critical care was necessary to treat or prevent imminent or life-threatening deterioration.  Critical care was time spent personally by me on the following activities: development of treatment plan with patient and/or surrogate as well as nursing, discussions with consultants, evaluation of patient's response to treatment, examination of patient, obtaining history from patient or surrogate, ordering and performing treatments and interventions, ordering and review of laboratory studies, ordering and review of radiographic studies, pulse oximetry and re-evaluation of patient's condition.  Tessie Fass MSN, AGACNP-BC [facility] Pulmonary/Critical Care Medicine Amion for pager   12/16/2023, 10:40 AM

## 2023-12-16 NOTE — Plan of Care (Signed)
  Problem: Education: Goal: Knowledge of General Education information will improve Description: Including pain rating scale, medication(s)/side effects and non-pharmacologic comfort measures Outcome: Progressing   Problem: Clinical Measurements: Goal: Respiratory complications will improve Outcome: Progressing   Problem: Safety: Goal: Ability to remain free from injury will improve Outcome: Progressing   

## 2023-12-16 NOTE — Progress Notes (Addendum)
Vanessa [name]  Assessment/ Plan: Pt is a 73 y.o. yo female  with a past medical history HTN, NICM, CHF, pulmonary hypertension, mitral and tricuspid valve disease who present w/ surgery for valvular disease c/b shock, AHRF, and AKI   # Oligo-Anuric AKI on CKD 3A: Multifactorial etiology including cardiorenal syndrome and ischemic ATN due to shock.  Started CRRT on 1/2 for fluid overload.  The CRRT was held on 1/12 because of worsening leukocytosis and poor access with plan to have line holiday. IHD on 1/13 with IDH and no UF. Resumed CRRT on 1/14 to manage volume.  Tolerating well with the help of pressors.  We will aim to reduce UF rate around 50-100 cc an hour to optimize volume. Continue daily lab and a strict ins and out.    #Multifactorial shock: Vasoplegic and cardioplegia catheter surgery.   - Has been on pressors and inotropes.  Now off milrinone.  Receiving Levophed.     - she is on midodrine  - s/p antibiotics (s/p meropenem and s/p vanc) and is s/p stress dose steroids.  Resume antibiotics per primary team discretion    #Acute exacerbation of heart failure with reduced ejection fraction: Volume overload.  - Optimize volume with renal replacement therapy as above    #Severe MR and tricuspid valvular disease:  - Status post repair.  CT surgery managing   #Postoperative cardiac tamponade:  - Continue monitoring per cardiology   # Hyponatremia: In the setting of AKI, monitor.     #Anemia: Related to blood loss and compounded by AKI and critical illness.    - Transfusions per primary team.   - Started aranesp 40 mcg weekly on Tuesdays   # Thrombocytopenia: Platelet counts are downtrending.  Off of heparin now and switching to Coumadin.  Discussed with ICU and heart failure team.   Subjective: Seen and examined in ICU.  No urine output and net negative around 2 L by CRRT.  Requiring Levophed around 6 mics.  Still having some short of  breath, denies chest pain.  No other new event.   Objective Vital signs in last 24 hours: Vitals:   12/16/23 0745 12/16/23 0800 12/16/23 0815 12/16/23 0830  BP: (!) 72/46 (!) 94/56 93/67 (!) 94/57  Pulse: 92 90 89 91  Resp: (!) 29 (!) 31 (!) 25 (!) 25  Temp:      TempSrc:      SpO2: 96% 94% 93% 95%  Weight:      Height:       Weight change: -1 kg  Intake/Output Summary (Last 24 hours) at 12/16/2023 0855 Last data filed at 12/16/2023 0800 Gross per 24 hour  Intake 2930.74 ml  Output 4980.1 ml  Net -2049.36 ml       Labs: RENAL PANEL Recent Labs  Lab 12/12/23 0215 12/12/23 0939 12/13/23 0303 12/14/23 0240 12/14/23 0752 12/14/23 1602 12/15/23 0251 12/15/23 1608 12/16/23 0415 12/16/23 0417  NA 134*  --  131* 131* 129* 130* 133* 132* 134*  --   K 4.4  --  5.3* 4.2 5.2* 4.8 4.3 5.1 4.9  --   CL 97*  --  91* 92* 89* 95* 101 98 99  --   CO2 25  --  21* 26 24 25 23 26 26   --   GLUCOSE 111*  --  135* 124* 155* 147* 164* 136* 157*  --   BUN 23  --  56* 36* 46* 36* 25* 24* 22  --  CREATININE 1.37*  --  3.02* 2.50* 2.79* 2.27* 1.66* 1.48* 1.50*  --   CALCIUM 8.9  --  9.2 8.6* 8.6* 8.3* 7.7* 8.6* 8.3*  --   MG 2.8*  --  2.9* 2.2  --   --  2.3  --   --  2.6*  PHOS 3.6  --  8.3* 5.3* 6.8* 5.0* 3.8 3.4 3.3  --   ALBUMIN 3.0*   < > 2.7*  2.8* 2.6* 2.5* 2.5* 2.3* 2.5* 2.6* 2.6*   < > = values in this interval not displayed.    Liver Function Tests: Recent Labs  Lab 12/12/23 0939 12/13/23 0303 12/14/23 0240 12/15/23 1608 12/16/23 0415 12/16/23 0417  AST 41 39  --   --   --  35  ALT 44 44  --   --   --  45*  ALKPHOS 76 66  --   --   --  64  BILITOT 13.0* 11.9*  --   --   --  7.4*  PROT 7.5 7.4  --   --   --  7.0  ALBUMIN 2.9* 2.7*  2.8*   < > 2.5* 2.6* 2.6*   < > = values in this interval not displayed.   No results for input(s): "LIPASE", "AMYLASE" in the last 168 hours. No results for input(s): "AMMONIA" in the last 168 hours.  CBC: Recent Labs     12/13/23 0303 12/13/23 1048 12/14/23 0240 12/15/23 0253 12/16/23 0417  HGB 10.2* 9.5* 9.2* 8.9* 9.5*  MCV 97.1 99.7 96.4 99.3 101.6*    Cardiac Enzymes: No results for input(s): "CKTOTAL", "CKMB", "CKMBINDEX", "TROPONINI" in the last 168 hours. CBG: Recent Labs  Lab 12/15/23 1601 12/15/23 1944 12/15/23 2324 12/16/23 0307 12/16/23 0743  GLUCAP 117* 135* 133* 133* 132*    Iron Studies: No results for input(s): "IRON", "TIBC", "TRANSFERRIN", "FERRITIN" in the last 72 hours. Studies/Results: DG CHEST PORT 1 VIEW Result Date: 12/14/2023 CLINICAL DATA:  Central line placement EXAM: PORTABLE CHEST 1 VIEW COMPARISON:  12/14/2023 FINDINGS: Right dialysis catheter placement with the tip at the cavoatrial junction. No pneumothorax. Cardiomegaly with vascular congestion. Left lower lobe retrocardiac opacity is unchanged. No confluent opacity on the right. IMPRESSION: Right central line placement with the tip at the cavoatrial junction. No pneumothorax. Cardiomegaly, vascular congestion. Stable left lower lobe atelectasis or infiltrate. Electronically Signed   By: Charlett Nose M.D.   On: 12/14/2023 10:28   ECHOCARDIOGRAM COMPLETE Result Date: 12/14/2023    ECHOCARDIOGRAM REPORT   Patient Name:   Vanessa [name] Date of Exam: 12/14/2023 Medical Rec #:  [id number]        Height:       63.0 in Accession #:    [id number]       Weight:       181.4 lb Date of Birth:  08/28/1951         BSA:          1.855 m Patient Age:    72 years         BP:           93/60 mmHg Patient Gender: F                HR:           95 bpm. Exam Location:  Inpatient Procedure: 2D Echo, Color Doppler, Cardiac Doppler and Intracardiac            Opacification Agent Indications:    I31.3  Pericardial effusion  History:        Patient has prior history of Echocardiogram examinations, most                 recent 12/10/2023. CHF, Pulmonary HTN; Risk Factors:Hypertension.                 30mm TriRing T30 placed 11/29/23.                   Mitral Valve: 31 mm Medtronic bioprosthetic valve valve is                 present in the mitral position. Procedure Date: 11/29/23.  Sonographer:    Irving Burton Senior RDCS Referring Phys: [id number] LINDSAY NICOLE [facility] IMPRESSIONS  1. Left ventricular ejection fraction, by estimation, is 35 to 40%. The left ventricle has moderately decreased function. The left ventricle demonstrates regional wall motion abnormalities (see scoring diagram/findings for description). There is mild asymmetric left ventricular hypertrophy of the infero-lateral segment. Left ventricular diastolic function could not be evaluated.  2. Right ventricular systolic function is moderately reduced. The right ventricular size is moderately enlarged. There is severely elevated pulmonary artery systolic pressure. The estimated right ventricular systolic pressure is 64.8 mmHg.  3. Left atrial size was moderately dilated.  4. The mitral valve has been repaired/replaced. Trivial mitral valve regurgitation. Moderate mitral stenosis. The mean mitral valve gradient is 6.0 mmHg with average heart rate of 90 bpm. There is a 31 mm Medtronic bioprosthetic valve present in the mitral position. Procedure Date: 11/29/23.  5. TriRing T30 implanted 11/29/23, echo findings are consistent with normal structure and central tricuspid regurgitation. Tricuspid valve gradient 1.4 mm HG at heart rate of 88 bpm. The tricuspid valve is has been repaired/replaced. Tricuspid valve regurgitation is mild to moderate.  6. The aortic valve is tricuspid. Aortic valve regurgitation is trivial. Mild aortic valve stenosis. Aortic valve mean gradient measures 16.0 mmHg. Aortic valve Vmax measures 2.71 m/s.  7. The inferior vena cava is dilated in size with <50% respiratory variability, suggesting right atrial pressure of 15 mmHg. Comparison(s): No prior Echocardiogram. LV Septal bounce and IVC plethora remain. FINDINGS  Left Ventricle: Left ventricular ejection fraction, by estimation, is 35  to 40%. The left ventricle has moderately decreased function. The left ventricle demonstrates regional wall motion abnormalities. Definity contrast agent was given IV to delineate the left ventricular endocardial borders. The left ventricular internal cavity size was normal in size. There is mild asymmetric left ventricular hypertrophy of the infero-lateral segment. Left ventricular diastolic function could not be evaluated due to  mitral valve replacement. Left ventricular diastolic function could not be evaluated.  LV Wall Scoring: The basal inferior segment is akinetic. The anterior septum, mid inferoseptal segment, mid inferior segment, and basal inferoseptal segment are hypokinetic. The entire anterior wall, entire lateral wall, and entire apex are normal. Right Ventricle: The right ventricular size is moderately enlarged. No increase in right ventricular wall thickness. Right ventricular systolic function is moderately reduced. There is severely elevated pulmonary artery systolic pressure. The tricuspid regurgitant velocity is 3.53 m/s, and with an assumed right atrial pressure of 15 mmHg, the estimated right ventricular systolic pressure is 64.8 mmHg. Left Atrium: Left atrial size was moderately dilated. Right Atrium: Right atrial size was normal in size. Pericardium: There is no evidence of pericardial effusion. Mitral Valve: The mitral valve has been repaired/replaced. Trivial mitral valve regurgitation. There is a 31 mm Medtronic bioprosthetic valve present in the mitral  position. Procedure Date: 11/29/23. Moderate mitral valve stenosis. MV peak gradient, 14.1  mmHg. The mean mitral valve gradient is 6.0 mmHg with average heart rate of 90 bpm. Tricuspid Valve: TriRing T30 implanted 11/29/23, echo findings are consistent with normal structure and central tricuspid regurgitation. Tricuspid valve gradient 1.4 mm HG at heart rate of 88 bpm. The tricuspid valve is has been repaired/replaced. Tricuspid valve  regurgitation is mild to moderate. No evidence of tricuspid stenosis. Aortic Valve: The aortic valve is tricuspid. Aortic valve regurgitation is trivial. Mild aortic stenosis is present. Aortic valve mean gradient measures 16.0 mmHg. Aortic valve peak gradient measures 29.4 mmHg. Aortic valve area, by VTI measures 0.76 cm. Pulmonic Valve: The pulmonic valve was normal in structure. Pulmonic valve regurgitation is trivial. No evidence of pulmonic stenosis. Aorta: The aortic root and ascending aorta are structurally normal, with no evidence of dilitation. Pulmonary Artery: The pulmonary artery is of normal size. Venous: The inferior vena cava is dilated in size with less than 50% respiratory variability, suggesting right atrial pressure of 15 mmHg. IAS/Shunts: No atrial level shunt detected by color flow Doppler.  LEFT VENTRICLE PLAX 2D LVIDd:         4.70 cm   Diastology LVIDs:         3.80 cm   LV e' medial:    6.37 cm/s LV PW:         1.30 cm   LV E/e' medial:  29.7 LV IVS:        0.90 cm   LV e' lateral:   9.64 cm/s LVOT diam:     1.70 cm   LV E/e' lateral: 19.6 LV SV:         31 LV SV Index:   17 LVOT Area:     2.27 cm  RIGHT VENTRICLE RV S prime:     9.48 cm/s TAPSE (M-mode): 1.0 cm LEFT ATRIUM             Index        RIGHT ATRIUM           Index LA diam:        4.00 cm 2.16 cm/m   RA Area:     12.10 cm LA Vol (A2C):   59.8 ml 32.23 ml/m  RA Volume:   27.00 ml  14.55 ml/m LA Vol (A4C):   80.2 ml 43.23 ml/m LA Biplane Vol: 74.6 ml 40.21 ml/m  AORTIC VALVE AV Area (Vmax):    0.86 cm AV Area (Vmean):   0.86 cm AV Area (VTI):     0.76 cm AV Vmax:           271.00 cm/s AV Vmean:          178.500 cm/s AV VTI:            0.405 m AV Peak Grad:      29.4 mmHg AV Mean Grad:      16.0 mmHg LVOT Vmax:         102.13 cm/s LVOT Vmean:        67.667 cm/s LVOT VTI:          0.136 m LVOT/AV VTI ratio: 0.34  AORTA Ao Root diam: 2.80 cm Ao Asc diam:  3.30 cm MITRAL VALVE                TRICUSPID VALVE MV Area (PHT):  2.07 cm     TV Peak grad:   4.0 mmHg MV Area VTI:  0.82 cm     TV Mean grad:   1.0 mmHg MV Peak grad:  14.1 mmHg    TV Vmax:        1.00 m/s MV Mean grad:  6.0 mmHg     TV Vmean:       55.3 cm/s MV Vmax:       1.88 m/s     TV VTI:         0.26 msec MV Vmean:      99.6 cm/s    TR Peak grad:   49.8 mmHg MV Decel Time: 366 msec     TR Vmax:        353.00 cm/s MV E velocity: 189.00 cm/s                             SHUNTS                             Systemic VTI:  0.14 m                             Systemic Diam: 1.70 cm Riley Lam MD Electronically signed by Riley Lam MD Signature Date/Time: 12/14/2023/10:23:48 AM    Final     Medications: Infusions:   prismasol BGK 4/2.5 400 mL/hr at 12/16/23 0212    prismasol BGK 4/2.5 400 mL/hr at 12/16/23 0214   sodium chloride Stopped (12/11/23 1110)   amiodarone 30 mg/hr (12/16/23 0800)   anticoagulant sodium citrate     feeding supplement (VITAL AF 1.2 CAL) 55 mL/hr at 12/16/23 0800   heparin 950 Units/hr (12/16/23 0800)   meropenem (MERREM) IV Stopped (12/16/23 1610)   norepinephrine (LEVOPHED) Adult infusion 6 mcg/min (12/16/23 0800)   prismasol BGK 4/2.5 1,500 mL/hr at 12/16/23 0809    Scheduled Medications:  Chlorhexidine Gluconate Cloth  6 each Topical Daily   darbepoetin (ARANESP) injection - NON-DIALYSIS  40 mcg Subcutaneous Q Tue-1800   feeding supplement  1 Container Oral TID BM   fiber supplement (BANATROL TF)  60 mL Per Tube TID   Gerhardt's butt cream   Topical BID   influenza vaccine adjuvanted  0.5 mL Intramuscular Tomorrow-1000   insulin aspart  0-24 Units Subcutaneous Q4H   linezolid  600 mg Per Tube Q12H   midodrine  15 mg Per Tube Q8H   multivitamin  1 tablet Per Tube BID   warfarin  2.5 mg Oral ONCE-1600   Warfarin - Pharmacist Dosing Inpatient   Does not apply q1600    have reviewed scheduled and prn medications.  Physical Exam: General:NAD, comfortable Heart:RRR, s1s2 nl Lungs: Reduced breath sound  bilateral. Abdomen:soft, Non-tender, non-distended Extremities: Peripheral  edema+ Dialysis Access: .  Right IJ temporary HD catheter was replaced by PCCM on 1/14. Mercia Dowe Prasad Milos Milligan 12/16/2023,8:55 AM  LOS: 17 days

## 2023-12-16 NOTE — Progress Notes (Signed)
PHARMACY - ANTICOAGULATION CONSULT NOTE  Pharmacy Consult for IV heparin transition to warfarin Indication: atrial fibrillation  No Known Allergies  Patient Measurements: Height: 5\' 3"  (160 cm) Weight: 78.5 kg (173 lb 1 oz) IBW/kg (Calculated) : 52.4 Heparin Dosing Weight: ~ 70 kg  Vital Signs: Temp: 98.1 F (36.7 C) (01/16 0730) Temp Source: Axillary (01/16 0730) BP: 90/64 (01/16 1745) Pulse Rate: 66 (01/16 1745)  Labs: Recent Labs    12/14/23 0240 12/14/23 0752 12/15/23 0251 12/15/23 0253 12/15/23 1118 12/15/23 1608 12/16/23 0415 12/16/23 0417 12/16/23 1601  HGB 9.2*  --   --  8.9*  --   --   --  9.5*  --   HCT 29.3*  --   --  29.6*  --   --   --  31.3*  --   PLT 126*  --   --  111*  --   --   --  89*  --   APTT 82*  --  82*  --   --   --   --  98* 74*  LABPROT  --   --   --   --  14.7  --   --  14.9  --   INR  --   --   --   --  1.1  --   --  1.1  --   HEPARINUNFRC 0.39  --  0.38  --   --   --  0.53  --  0.46  CREATININE 2.50*   < > 1.66*  --   --  1.48* 1.50*  --  1.22*   < > = values in this interval not displayed.    Estimated Creatinine Clearance: 41.3 mL/min (A) (by C-G formula based on SCr of 1.22 mg/dL (H)).   Assessment: 73 yo female s/p scheduled bioprosthetic mitral valve replacement with tissue valve 12/30 now with afib, pharmacy asked to start anticoagulation with IV heparin. Spontaneously converted 1/7; flipped back to AF overnight 1/9 0300. Subsequently given amio bolus & gtt to 60 1/9; converted back to NSR 1/9 1900. Heparin running in PIV now, with new HD cath in place. Tbili improved from 13 to 11. Will continue to utilize aPTT at this time, levels are not correlating. CBC stable and no issues with heparin infusion at this time per RN. Still requiring vasopressors this morning for RRT. Baseline INR on 1/15 of 1.1, albumin persistently low ~2.3-2.5.  aPTT 74 sec, heparin level 0.46. Both therapeutic. No bleeding issues.  Goal of Therapy:  Heparin  level 0.3-0.5 units/ml aPTT 66-85 sec INR goal 2 - 3 (bMVR) Monitor platelets by anticoagulation protocol: Yes   Plan:  Continue heparin at 950 units/hr  F/u a.m. heparin level, aPTT, and CBC  Christoper Fabian, PharmD, BCPS Please see amion for complete clinical pharmacist phone list 12/16/2023 6:01 PM

## 2023-12-16 NOTE — Progress Notes (Addendum)
Patient ID: Vanessa [name], female   DOB: 1951/01/08, 73 y.o.   MRN: [id number]      Advanced Heart Failure Rounding Note  Cardiologist: Dr. Shirlee Latch  Chief Complaint: Cardiogenic shock  Subjective:   S/P MVR (tissue) and TV ring and same day return to OR for tamponade/bleeding. 1/2: Progressive volume overload with poor UOP, CVVH started but ended up re-intubated.   Extubated 12/05/23 1/12: CVVH stopped 1/13: iHD, HD line removed 1/14 CRRT Restarted.   NE down to 6. CO-OX 61%.   4.4L volume removed w/ CRRT last 24 hrs, negative 1.9L for the day. CVP 5.  Very weak. Able to eat a little bit yesterday but does not have much appetite.  Objective:   Weight Range: 78.5 kg Body mass index is 30.66 kg/m.   Vital Signs:   Temp:  [97.6 F (36.4 C)-98.4 F (36.9 C)] 97.6 F (36.4 C) (01/16 0309) Pulse Rate:  [68-111] 83 (01/16 0615) Resp:  [12-35] 25 (01/16 0615) BP: (55-115)/(42-90) 92/81 (01/16 0615) SpO2:  [94 %-100 %] 95 % (01/16 0615) Weight:  [78.5 kg] 78.5 kg (01/16 0500) Last BM Date : 12/14/23  Weight change: Filed Weights   12/14/23 0500 12/15/23 0545 12/16/23 0500  Weight: 82.3 kg 79.5 kg 78.5 kg   Intake/Output:   Intake/Output Summary (Last 24 hours) at 12/16/2023 0657 Last data filed at 12/16/2023 0600 Gross per 24 hour  Intake 2875.12 ml  Output 4924.1 ml  Net -2048.98 ml    Physical Exam  General:  Fatigued appearing. Sitting up in bed. HEENT: + [place]. Neck: supple. No JVD. R internal jugular HD cath Cor:  Irregular rhythm. No rubs, gallops or murmurs. Lungs: clear Abdomen: soft, nontender, nondistended.  Extremities: no cyanosis, clubbing, rash, edema Neuro: alert & orientedx3. Affect flat.  Telemetry   Afib 90s  Labs    CBC Recent Labs    12/15/23 0253 12/16/23 0417  WBC 14.7* 15.5*  HGB 8.9* 9.5*  HCT 29.6* 31.3*  MCV 99.3 101.6*  PLT 111* 89*   Basic Metabolic Panel Recent Labs    28/41/32 0251 12/15/23 1608 12/16/23 0415  12/16/23 0417  NA 133* 132* 134*  --   K 4.3 5.1 4.9  --   CL 101 98 99  --   CO2 23 26 26   --   GLUCOSE 164* 136* 157*  --   BUN 25* 24* 22  --   CREATININE 1.66* 1.48* 1.50*  --   CALCIUM 7.7* 8.6* 8.3*  --   MG 2.3  --   --  2.6*  PHOS 3.8 3.4 3.3  --    Liver Function Tests Recent Labs    12/16/23 0415 12/16/23 0417  AST  --  35  ALT  --  45*  ALKPHOS  --  64  BILITOT  --  7.4*  PROT  --  7.0  ALBUMIN 2.6* 2.6*   No results for input(s): "LIPASE", "AMYLASE" in the last 72 hours. Cardiac Enzymes No results for input(s): "CKTOTAL", "CKMB", "CKMBINDEX", "TROPONINI" in the last 72 hours.  BNP: BNP (last 3 results) Recent Labs    05/26/23 1610 08/26/23 1103 09/22/23 0905  BNP 778.0* 781.0* 926.0*   Other results:  Imaging   No results found.       Medications:   Scheduled Medications:  Chlorhexidine Gluconate Cloth  6 each Topical Daily   darbepoetin (ARANESP) injection - NON-DIALYSIS  40 mcg Subcutaneous Q Tue-1800   feeding supplement  1 Container Oral TID  BM   fiber supplement (BANATROL TF)  60 mL Per Tube TID   Gerhardt's butt cream   Topical BID   influenza vaccine adjuvanted  0.5 mL Intramuscular Tomorrow-1000   insulin aspart  0-24 Units Subcutaneous Q4H   linezolid  600 mg Per Tube Q12H   midodrine  15 mg Per Tube Q8H   multivitamin  1 tablet Per Tube BID   Warfarin - Pharmacist Dosing Inpatient   Does not apply q1600    Infusions:   prismasol BGK 4/2.5 400 mL/hr at 12/16/23 0212    prismasol BGK 4/2.5 400 mL/hr at 12/16/23 0214   sodium chloride Stopped (12/11/23 1110)   amiodarone 30 mg/hr (12/16/23 0610)   anticoagulant sodium citrate     feeding supplement (VITAL AF 1.2 CAL) 55 mL/hr at 12/16/23 0600   heparin 1,100 Units/hr (12/16/23 0600)   meropenem (MERREM) IV 1 g (12/16/23 0601)   norepinephrine (LEVOPHED) Adult infusion 6 mcg/min (12/16/23 0600)   prismasol BGK 4/2.5 1,500 mL/hr at 12/16/23 0132    PRN Medications: sodium  chloride, alteplase, anticoagulant sodium citrate, artificial tears, bisacodyl **OR** bisacodyl, heparin, heparin, HYDROmorphone (DILAUDID) injection, levalbuterol, lidocaine (PF), lidocaine-prilocaine, ondansetron (ZOFRAN) IV, mouth rinse, oxyCODONE, pentafluoroprop-tetrafluoroeth, pneumococcal 20-valent conjugate vaccine, sodium chloride, traZODone  Patient Profile   Vanessa [name] is a 73 y.o. with history of nonischemic cardiomyopathy and severe MR s/p Mitral Clip x2 (2020), pHTN, and CKD IIIb.   Assessment/Plan   1. Post Cardiotomy Cardiogenic & septic shock: Bioprosthetic MVR and tricuspid ring 11/29/23 with Dr. Leafy Ro. Pre-op cath with no significant CAD.  Pre-op echo with EF 40-45%, severe MR. Went back to the OR with post-op tamponade for clean-out.  Echo 12/31 showed EF 25-30%, moderate RV dysfunction, s/p bioprosthetic mitral valve replacement with trivial MR and tricuspid valve repair with trivial TR. Worsened on 1/2 and re-intubated in setting of volume overload, oliguric renal failure &  pulmonary hypertension. Extubated on 12/04/22. Progressive renal failure requiring CRRT for volume removal.  CVVH stopped 1/12 then restarted 01/14 for volume removal. CVP 5 on CRRT. Currently pulling for -50-100/hr - NE down to 6, wean as able.  - Continue midodrine 15 tid.  - CO-OX 61% - 1/13 CT C/A/P  moderate hemorrhagic pericardial effusion and 5.3 cm hematoma right heart border.  Repeat Echo no tamponade.  2. Mitral regurgitation:  TEE in 11/19 with severe MR, restricted posterior leaflet.  She had Mitraclip x 2 in 1/20. Post-op echo showed mild MR and mild mitral stenosis.   Echo in 9/24 showed MV s/p Mitraclip x 2 with mean gradient 6 mmHg and severe mitral regurgitation. TEE was then done to evaluate, showing 2 Mitraclips in place with mean gradient 4 mmHg and severe eccentric MR from between the 2 clips with ERO 0.54 cm^2.  She was not thought to be a candidate for an additional Mitraclip due to  lack of room between the two existing clips. R/LHC (11/24) showed moderate mixed pulmonary arterial and pulmonary venous hypertension likely due to mitral valve regurgitation.  S/p MV replacement and Tricuspid repair on 11/29/23 with Dr. Leafy Ro.  -Echo stable  3. Ventricular ectopy:  Continue amio drip. Marland Kitchen 4. Pulmonary hypertension: Most recent pre-op RHC (11/24) with PVR 4.5, with moderate mixed pulmonary arterial and pulmonary venous hypertension likely due to mitral valve regurgitation. Now s/p MVR, PA pressure severely elevated post-op.  Suspect still mixed PAH/PVH with elevated PCWP.  NO was tried at 10 ppm but did not have much effect so  now off.  Will hold off on pulmonary vasodilators; small clinical trials demonstrate no improvement and some markers of harm with PDE5i in PH in the setting of mitral valve disease.  5. 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 TSH was normal in 8/24. Follows with Endocrinology 6. AKI on CKD stage 3:  -1/14 CRRT restarted  7. Blood loss anemia - Hgb stable.  8. Complete heart block: She had post-op CHB. Resolved. Afib/flutter today 9. Hypoxic / Hypercapnic respiratory failure:  - Reintubated 01/02. Volume overload. CRRT started. Empiric abx. - Extubated 01/05.  - O2 stable on 2L [place]. 10. Atrial fibrillation:  -Back in Afib on 01/13. Continue amio drip. Rate controlled. On Heparin drip + warfarin. 11. ID: Completed 7 day course of vanc/merrem with no obvious source of infection.  - 1/9 abdominal US with dilated gallbladder but no other evidence for cholecystitis.  T bili was up to 15 but now trending down. - HD catheter removed 01/13 for line holiday. New line 01/14 for CVVH. - WBCS coming down, 15K - On broad spectrum abx, merrem and zyvox 12. Thrombocytopenia:  - Platelets  drifting down [phone] - ? D/t sepsis - Has been on heparin gtt. Transitioning to warfarin 13. Deconditioning - will need extensive PT.  - OOB today.  14. FEN:  Getting tube feeds   FINCH, LINDSAY N PA-C 12/16/2023 6:57 AM  Patient seen with PA, agree with the above note.   She is in NE 5 today with co-ox 61%.  CVP 5, weight coming down. I/Os net negative 1988.   WBCs 15.5, she remains on linezolid/meropenem.   She is in atrial fibrillation rate 80s on amiodarone gtt and heparin gtt.  Plts lower at 89.   She was up to chair for about 2 hrs yesterday.   General: NAD Neck: No JVD, no thyromegaly or thyroid nodule.  Lungs: Clear to auscultation bilaterally with normal respiratory effort. CV: Nondisplaced PMI.  Heart irregular S1/S2, no S3/S4, no murmur.  No peripheral edema.    Abdomen: Soft, nontender, no hepatosplenomegaly, no distention.  Skin: Intact without lesions or rashes.  Neurologic: Alert and oriented x 3.  Psych: Normal affect. Extremities: No clubbing or cyanosis.  HEENT: Normal.   Suspect combined septic/cardiogenic shock.  WBCs trending down since lines removed/changed.  Still on NE 5, improved.  CVP not elevated and weight down.  - Would be more gentle with CVVH today, aim for net 50 cc/hr UF and can wean NE for SBP > 90.  - Continue midodrine    Broad spectrum abx to continue.    She remains in rate-controlled AF. Plts trending down, ?due to development of sepsis (think more likely than HIT).  She is on heparin gtt.   CRITICAL CARE Performed by: Marca Ancona  Total critical care time: 40 minutes  Critical care time was exclusive of separately billable procedures and treating other patients.  Critical care was necessary to treat or prevent imminent or life-threatening deterioration.  Critical care was time spent personally by me on the following activities: development of treatment plan with patient and/or surrogate as well as nursing, discussions with consultants, evaluation of patient's response to treatment, examination of patient, obtaining history from patient or surrogate, ordering and performing treatments and  interventions, ordering and review of laboratory studies, ordering and review of radiographic studies, pulse oximetry and re-evaluation of patient's condition.  Marca Ancona 12/16/2023 7:40 AM

## 2023-12-17 ENCOUNTER — Inpatient Hospital Stay (HOSPITAL_COMMUNITY): Payer: PPO

## 2023-12-17 ENCOUNTER — Encounter (HOSPITAL_COMMUNITY)
Admission: RE | Disposition: A | Discharge status: Other Acute Inpatient Hospital | Payer: Self-pay | Source: Home / Self Care | Attending: Thoracic Surgery (Cardiothoracic Vascular Surgery)

## 2023-12-17 DIAGNOSIS — F329 Major depressive disorder, single episode, unspecified: Secondary | ICD-10-CM

## 2023-12-17 DIAGNOSIS — Z952 Presence of prosthetic heart valve: Secondary | ICD-10-CM | POA: Diagnosis not present

## 2023-12-17 DIAGNOSIS — I509 Heart failure, unspecified: Secondary | ICD-10-CM

## 2023-12-17 DIAGNOSIS — I272 Pulmonary hypertension, unspecified: Secondary | ICD-10-CM | POA: Diagnosis not present

## 2023-12-17 DIAGNOSIS — R570 Cardiogenic shock: Secondary | ICD-10-CM | POA: Diagnosis not present

## 2023-12-17 DIAGNOSIS — I340 Nonrheumatic mitral (valve) insufficiency: Secondary | ICD-10-CM | POA: Diagnosis not present

## 2023-12-17 HISTORY — PX: RIGHT HEART CATH: CATH118263

## 2023-12-17 LAB — POCT I-STAT EG7
Acid-Base Excess: 1 mmol/L (ref 0.0–2.0)
Acid-Base Excess: 3 mmol/L — ABNORMAL HIGH (ref 0.0–2.0)
Bicarbonate: 27.4 mmol/L (ref 20.0–28.0)
Bicarbonate: 28.6 mmol/L — ABNORMAL HIGH (ref 20.0–28.0)
Calcium, Ion: 1.14 mmol/L — ABNORMAL LOW (ref 1.15–1.40)
Calcium, Ion: 1.15 mmol/L (ref 1.15–1.40)
HCT: 36 % (ref 36.0–46.0)
HCT: 37 % (ref 36.0–46.0)
Hemoglobin: 12.2 g/dL (ref 12.0–15.0)
Hemoglobin: 12.6 g/dL (ref 12.0–15.0)
O2 Saturation: 33 %
O2 Saturation: 41 %
Potassium: 5.4 mmol/L — ABNORMAL HIGH (ref 3.5–5.1)
Potassium: 5.4 mmol/L — ABNORMAL HIGH (ref 3.5–5.1)
Sodium: 133 mmol/L — ABNORMAL LOW (ref 135–145)
Sodium: 133 mmol/L — ABNORMAL LOW (ref 135–145)
TCO2: 29 mmol/L (ref 22–32)
TCO2: 30 mmol/L (ref 22–32)
pCO2, Ven: 47.5 mm[Hg] (ref 44–60)
pCO2, Ven: 49 mm[Hg] (ref 44–60)
pH, Ven: 7.368 (ref 7.25–7.43)
pH, Ven: 7.374 (ref 7.25–7.43)
pO2, Ven: 21 mm[Hg] — CL (ref 32–45)
pO2, Ven: 24 mm[Hg] — CL (ref 32–45)

## 2023-12-17 LAB — RENAL FUNCTION PANEL
Albumin: 2.9 g/dL — ABNORMAL LOW (ref 3.5–5.0)
Albumin: 2.8 g/dL — ABNORMAL LOW (ref 3.5–5.0)
Anion gap: 12 (ref 5–15)
Anion gap: 13 (ref 5–15)
BUN: 21 mg/dL (ref 8–23)
BUN: 22 mg/dL (ref 8–23)
CO2: 24 mmol/L (ref 22–32)
CO2: 23 mmol/L (ref 22–32)
Calcium: 9.1 mg/dL (ref 8.9–10.3)
Calcium: 8.5 mg/dL — ABNORMAL LOW (ref 8.9–10.3)
Chloride: 98 mmol/L (ref 98–111)
Chloride: 97 mmol/L — ABNORMAL LOW (ref 98–111)
Creatinine, Ser: 1.14 mg/dL — ABNORMAL HIGH (ref 0.44–1.00)
Creatinine, Ser: 1.34 mg/dL — ABNORMAL HIGH (ref 0.44–1.00)
GFR, Estimated: 51 mL/min — ABNORMAL LOW (ref >60)
GFR, Estimated: 42 mL/min — ABNORMAL LOW (ref >60)
Glucose, Bld: 152 mg/dL — ABNORMAL HIGH (ref 70–99)
Glucose, Bld: 214 mg/dL — ABNORMAL HIGH (ref 70–99)
Phosphorus: 2.8 mg/dL (ref 2.5–4.6)
Phosphorus: 3.3 mg/dL (ref 2.5–4.6)
Potassium: 4.8 mmol/L (ref 3.5–5.1)
Potassium: 5.2 mmol/L — ABNORMAL HIGH (ref 3.5–5.1)
Sodium: 134 mmol/L — ABNORMAL LOW (ref 135–145)
Sodium: 133 mmol/L — ABNORMAL LOW (ref 135–145)

## 2023-12-17 LAB — GLUCOSE, CAPILLARY
Glucose-Capillary: 166 mg/dL — ABNORMAL HIGH (ref 70–99)
Glucose-Capillary: 131 mg/dL — ABNORMAL HIGH (ref 70–99)
Glucose-Capillary: 99 mg/dL (ref 70–99)
Glucose-Capillary: 118 mg/dL — ABNORMAL HIGH (ref 70–99)
Glucose-Capillary: 171 mg/dL — ABNORMAL HIGH (ref 70–99)

## 2023-12-17 LAB — CBC
HCT: 34.9 % — ABNORMAL LOW (ref 36.0–46.0)
Hemoglobin: 10.6 g/dL — ABNORMAL LOW (ref 12.0–15.0)
MCH: 30.5 pg (ref 26.0–34.0)
MCHC: 30.4 g/dL (ref 30.0–36.0)
MCV: 100.6 fL — ABNORMAL HIGH (ref 80.0–100.0)
Platelets: 95 10*3/uL — ABNORMAL LOW (ref 150–400)
RBC: 3.47 MIL/uL — ABNORMAL LOW (ref 3.87–5.11)
RDW: 23.7 % — ABNORMAL HIGH (ref 11.5–15.5)
WBC: 16 10*3/uL — ABNORMAL HIGH (ref 4.0–10.5)
nRBC: 2.4 % — ABNORMAL HIGH (ref 0.0–0.2)

## 2023-12-17 LAB — MAGNESIUM: Magnesium: 2.7 mg/dL — ABNORMAL HIGH (ref 1.7–2.4)

## 2023-12-17 LAB — CATH TIP CULTURE: Culture: NO GROWTH

## 2023-12-17 LAB — COOXEMETRY PANEL
Carboxyhemoglobin: 2 % — ABNORMAL HIGH (ref 0.5–1.5)
Methemoglobin: 0.7 % (ref 0.0–1.5)
O2 Saturation: 54.2 %
Total hemoglobin: 11.1 g/dL — ABNORMAL LOW (ref 12.0–16.0)

## 2023-12-17 LAB — HEPARIN LEVEL (UNFRACTIONATED): Heparin Unfractionated: 0.4 [IU]/mL (ref 0.30–0.70)

## 2023-12-17 LAB — PROTIME-INR
INR: 1.2 (ref 0.8–1.2)
Prothrombin Time: 14.9 s (ref 11.4–15.2)

## 2023-12-17 LAB — APTT: aPTT: 74 s — ABNORMAL HIGH (ref 24–36)

## 2023-12-17 SURGERY — RIGHT HEART CATH
Anesthesia: LOCAL

## 2023-12-17 MED ORDER — HEPARIN (PORCINE) IN NACL 1000-0.9 UT/500ML-% IV SOLN
INTRAVENOUS | Status: DC | PRN
  Administered 2023-12-17: 500 mL

## 2023-12-17 MED ORDER — MELATONIN 3 MG PO TABS
3.0000 mg | ORAL_TABLET | ORAL | Status: DC
  Administered 2023-12-17 – 2023-12-18 (×2): 3 mg via ORAL
  Filled 2023-12-17 (×2): qty 1

## 2023-12-17 MED ORDER — BANATROL TF EN LIQD
60.0000 mL | Freq: Four times a day (QID) | ENTERAL | Status: DC
Start: 2023-12-17 — End: 2023-12-28
  Administered 2023-12-17 – 2023-12-28 (×39): 60 mL
  Filled 2023-12-17 (×40): qty 60

## 2023-12-17 MED ORDER — VITAL AF 1.2 CAL PO LIQD
660.0000 mL | ORAL | Status: DC
  Administered 2023-12-17 – 2023-12-21 (×5): 660 mL
  Filled 2023-12-17 (×3): qty 1000
  Filled 2023-12-17: qty 711
  Filled 2023-12-17: qty 1000
  Filled 2023-12-17: qty 711

## 2023-12-17 MED ORDER — LIDOCAINE HCL (PF) 1 % IJ SOLN
INTRAMUSCULAR | Status: DC | PRN
  Administered 2023-12-17: 2 mL

## 2023-12-17 MED ORDER — ASPIRIN 81 MG PO CHEW
81.0000 mg | CHEWABLE_TABLET | ORAL | Status: AC
  Administered 2023-12-17: 81 mg via ORAL
  Filled 2023-12-17: qty 1

## 2023-12-17 MED ORDER — LIDOCAINE HCL (PF) 1 % IJ SOLN
INTRAMUSCULAR | Status: AC
  Filled 2023-12-17: qty 30

## 2023-12-17 MED ORDER — ZINC SULFATE 220 (50 ZN) MG PO CAPS
220.0000 mg | ORAL_CAPSULE | Freq: Every day | ORAL | Status: DC
  Administered 2023-12-17 – 2023-12-29 (×13): 220 mg
  Filled 2023-12-17 (×13): qty 1

## 2023-12-17 MED ORDER — MILRINONE LACTATE IN DEXTROSE 20-5 MG/100ML-% IV SOLN
INTRAVENOUS | Status: AC
  Administered 2023-12-17: 0.125 ug/kg/min via INTRAVENOUS
  Filled 2023-12-17: qty 100

## 2023-12-17 MED ORDER — SERTRALINE HCL 50 MG PO TABS
25.0000 mg | ORAL_TABLET | Freq: Every day | ORAL | Status: DC
  Administered 2023-12-17 – 2023-12-22 (×6): 25 mg via ORAL
  Filled 2023-12-17 (×6): qty 1

## 2023-12-17 MED ORDER — MILRINONE LACTATE IN DEXTROSE 20-5 MG/100ML-% IV SOLN
0.1250 ug/kg/min | INTRAVENOUS | Status: DC
  Administered 2023-12-18 – 2023-12-20 (×2): 0.125 ug/kg/min via INTRAVENOUS
  Filled 2023-12-17 (×2): qty 100

## 2023-12-17 MED ORDER — SODIUM CHLORIDE 0.9 % IV SOLN: INTRAVENOUS | Status: DC

## 2023-12-17 MED ORDER — WARFARIN SODIUM 2.5 MG PO TABS
2.5000 mg | ORAL_TABLET | Freq: Once | ORAL | Status: AC
  Administered 2023-12-17: 2.5 mg via ORAL
  Filled 2023-12-17: qty 1

## 2023-12-17 MED ORDER — VITAMIN C 500 MG PO TABS
250.0000 mg | ORAL_TABLET | Freq: Two times a day (BID) | ORAL | Status: DC
  Administered 2023-12-17 – 2023-12-29 (×24): 250 mg
  Filled 2023-12-17 (×25): qty 1

## 2023-12-17 MED ORDER — QUETIAPINE FUMARATE 25 MG PO TABS
25.0000 mg | ORAL_TABLET | Freq: Every day | ORAL | Status: DC
  Administered 2023-12-17 – 2023-12-21 (×5): 25 mg via ORAL
  Filled 2023-12-17 (×5): qty 1

## 2023-12-17 SURGICAL SUPPLY — 8 items
CATH BALLN WEDGE 5F 110CM (CATHETERS) IMPLANT
GUIDEWIRE .025 260CM (WIRE) IMPLANT
PACK CARDIAC CATHETERIZATION (CUSTOM PROCEDURE TRAY) ×2 IMPLANT
SHEATH GLIDE SLENDER 4/5FR (SHEATH) IMPLANT
SHEATH PROBE COVER 6X72 (BAG) IMPLANT
TRANSDUCER W/STOPCOCK (MISCELLANEOUS) IMPLANT
TUBING ART PRESS 72 MALE/FEM (TUBING) IMPLANT
WIRE EMERALD 3MM-J .025X260CM (WIRE) IMPLANT

## 2023-12-17 NOTE — Procedures (Signed)
I saw and evaluated the patient on CRRT.  I reviewed the last 24 hours events.  Adjustments to CRRT prescription are made as needed.  Continue with UF as tolerated  Filed Weights   12/15/23 0545 12/16/23 0500 12/17/23 0436  Weight: 79.5 kg 78.5 kg 73.2 kg    Recent Labs  Lab 12/17/23 0419  NA 134*  K 4.8  CL 98  CO2 24  GLUCOSE 152*  BUN 21  CREATININE 1.14*  CALCIUM 9.1  PHOS 2.8    Recent Labs  Lab 12/15/23 0253 12/16/23 0417 12/17/23 0419  WBC 14.7* 15.5* 16.0*  HGB 8.9* 9.5* 10.6*  HCT 29.6* 31.3* 34.9*  MCV 99.3 101.6* 100.6*  PLT 111* 89* 95*    Scheduled Meds:  Chlorhexidine Gluconate Cloth  6 each Topical Daily   darbepoetin (ARANESP) injection - NON-DIALYSIS  40 mcg Subcutaneous Q Tue-1800   feeding supplement  1 Container Oral TID BM   fiber supplement (BANATROL TF)  60 mL Per Tube TID   Gerhardt's butt cream   Topical BID   influenza vaccine adjuvanted  0.5 mL Intramuscular Tomorrow-1000   insulin aspart  0-24 Units Subcutaneous Q4H   linezolid  600 mg Per Tube Q12H   melatonin  3 mg Oral Q24H   midodrine  15 mg Per Tube Q8H   multivitamin  1 tablet Per Tube BID   sertraline  25 mg Oral Daily   Warfarin - Pharmacist Dosing Inpatient   Does not apply q1600   Continuous Infusions:   prismasol BGK 4/2.5 400 mL/hr at 12/17/23 0329    prismasol BGK 4/2.5 400 mL/hr at 12/17/23 0327   sodium chloride Stopped (12/11/23 1110)   sodium chloride 10 mL/hr at 12/17/23 0900   amiodarone 30 mg/hr (12/17/23 0900)   anticoagulant sodium citrate     feeding supplement (VITAL AF 1.2 CAL) Stopped (12/17/23 0725)   heparin Stopped (12/17/23 0846)   meropenem (MERREM) IV Stopped (12/17/23 0549)   norepinephrine (LEVOPHED) Adult infusion 7 mcg/min (12/17/23 0900)   prismasol BGK 4/2.5 1,500 mL/hr at 12/17/23 0412   PRN Meds:.sodium chloride, alteplase, anticoagulant sodium citrate, artificial tears, bisacodyl **OR** bisacodyl, heparin, heparin, HYDROmorphone  (DILAUDID) injection, levalbuterol, lidocaine (PF), lidocaine-prilocaine, ondansetron (ZOFRAN) IV, mouth rinse, oxyCODONE, pentafluoroprop-tetrafluoroeth, pneumococcal 20-valent conjugate vaccine, sodium chloride, traZODone   Louie Bun,  MD 12/17/2023, 9:05 AM

## 2023-12-17 NOTE — Plan of Care (Signed)
  Problem: Activity: Goal: Risk for activity intolerance will decrease Outcome: Not Progressing   

## 2023-12-17 NOTE — Progress Notes (Signed)
PHARMACY - ANTICOAGULATION CONSULT NOTE  Pharmacy Consult for IV heparin transition to warfarin Indication: atrial fibrillation  No Known Allergies  Patient Measurements: Height: 5\' 3"  (160 cm) Weight: 73.2 kg (161 lb 6 oz) IBW/kg (Calculated) : 52.4 Heparin Dosing Weight: ~ 70 kg  Vital Signs: Temp: 98.2 F (36.8 C) (01/17 0800) Temp Source: Oral (01/17 0800) BP: 104/68 (01/17 0830) Pulse Rate: 76 (01/17 0830)  Labs: Recent Labs    12/15/23 0253 12/15/23 1118 12/15/23 1608 12/16/23 0415 12/16/23 0417 12/16/23 1601 12/17/23 0419  HGB 8.9*  --   --   --  9.5*  --  10.6*  HCT 29.6*  --   --   --  31.3*  --  34.9*  PLT 111*  --   --   --  89*  --  95*  APTT  --   --   --   --  98* 74* 74*  LABPROT  --  14.7  --   --  14.9  --  14.9  INR  --  1.1  --   --  1.1  --  1.2  HEPARINUNFRC  --   --   --  0.53  --  0.46 0.40  CREATININE  --   --    < > 1.50*  --  1.22* 1.14*   < > = values in this interval not displayed.    Estimated Creatinine Clearance: 42.7 mL/min (A) (by C-G formula based on SCr of 1.14 mg/dL (H)).   Assessment: 73 yo female s/p scheduled bioprosthetic mitral valve replacement with tissue valve 12/30 now with afib, pharmacy asked to start anticoagulation with IV heparin. Spontaneously converted 1/7; flipped back to AF overnight 1/9 0300. Subsequently given amio bolus & gtt to 60 1/9; converted back to NSR 1/9 1900. Heparin running in PIV now, with new HD cath in place. Tbili improved from 13 to 11. Will continue to utilize aPTT at this time, levels are not correlating. CBC stable and no issues with heparin infusion at this time per RN. Still requiring vasopressors this morning for RRT. Baseline INR on 1/15 of 1.1, albumin persistently low ~2.3-2.5.  aPTT 74 sec, heparin level 0.40. Both therapeutic. No bleeding issues. INR 1.2 from 1.1 likely reflecting 1/15 dose of 2.5 mg.   Goal of Therapy:  Heparin level 0.3-0.5 units/ml aPTT 66-85 sec INR goal 2 - 3  (bMVR) Monitor platelets by anticoagulation protocol: Yes   Plan:  Warfarin 2.5 mg x1 today May need increase in dose over the weekend pending response Continue heparin at 950 units/hr  F/u a.m. heparin level, aPTT, and CBC  Rutherford Nail, PharmD PGY2 Critical Care Pharmacy Resident 12/17/2023 8:53 AM

## 2023-12-17 NOTE — Progress Notes (Signed)
[facility] NEPHROLOGY PROGRESS NOTE  Assessment/ Plan: Pt is a 73 y.o. yo female  with a past medical history HTN, NICM, CHF, pulmonary hypertension, mitral and tricuspid valve disease who present w/ surgery for valvular disease c/b shock, AHRF, and AKI   # Oligo-Anuric AKI on CKD 3A: Multifactorial etiology including cardiorenal syndrome and ischemic ATN due to shock.  Started CRRT on 1/2 for fluid overload.  The CRRT was held on 1/12 because of worsening leukocytosis and poor access with plan to have line holiday. IHD on 1/13 with IDH and no UF. Resumed CRRT on 1/14 to manage volume.  Tolerating well. UF as able   #Multifactorial shock: Vasoplegic and cardioplegia catheter surgery.   - midodrine and norepi. Cont mgmt per primary team   #Acute exacerbation of heart failure with reduced ejection fraction: Volume overload.  - Optimize volume with renal replacement therapy as above    #Severe MR and tricuspid valvular disease:  - Status post repair.  CT surgery managing   #Postoperative cardiac tamponade:  - Continue monitoring per cardiology   # Hyponatremia: improved with CRRT    #Anemia: Related to blood loss and compounded by AKI and critical illness.    - Transfusions per primary team.   - aranesp 40 mcg weekly on Tuesdays   # Thrombocytopenia: switched from heparin to coumadin. improving  Discussed with ICU and heart failure team.   Subjective: Eating breakfast. On norepi , tolerated total 2L UF. No other complaints   Objective Vital signs in last 24 hours: Vitals:   12/17/23 0815 12/17/23 0830 12/17/23 0845 12/17/23 0900  BP: (!) 82/48 104/68 (!) 88/69 (!) 85/62  Pulse: 63 76 87 69  Resp: (!) 47 (!) 21 20 (!) 26  Temp:      TempSrc:      SpO2: 96% 93% 97% 94%  Weight:      Height:       Weight change: -5.3 kg  Intake/Output Summary (Last 24 hours) at 12/17/2023 0906 Last data filed at 12/17/2023 0900 Gross per 24 hour  Intake 2596.97 ml   Output 4560 ml  Net -1963.03 ml       Labs: RENAL PANEL Recent Labs  Lab 12/13/23 0303 12/14/23 0240 12/14/23 0752 12/15/23 0251 12/15/23 1608 12/16/23 0415 12/16/23 0417 12/16/23 1601 12/17/23 0419  NA 131* 131*   < > 133* 132* 134*  --  133* 134*  K 5.3* 4.2   < > 4.3 5.1 4.9  --  5.2* 4.8  CL 91* 92*   < > 101 98 99  --  99 98  CO2 21* 26   < > 23 26 26   --  24 24  GLUCOSE 135* 124*   < > 164* 136* 157*  --  169* 152*  BUN 56* 36*   < > 25* 24* 22  --  22 21  CREATININE 3.02* 2.50*   < > 1.66* 1.48* 1.50*  --  1.22* 1.14*  CALCIUM 9.2 8.6*   < > 7.7* 8.6* 8.3*  --  8.9 9.1  MG 2.9* 2.2  --  2.3  --   --  2.6*  --  2.7*  PHOS 8.3* 5.3*   < > 3.8 3.4 3.3  --  2.8 2.8  ALBUMIN 2.7*  2.8* 2.6*   < > 2.3* 2.5* 2.6* 2.6* 2.9* 2.9*   < > = values in this interval not displayed.    Liver Function Tests: Recent Labs  Lab 12/12/23  1610 12/13/23 0303 12/14/23 0240 12/16/23 0417 12/16/23 1601 12/17/23 0419  AST 41 39  --  35  --   --   ALT 44 44  --  45*  --   --   ALKPHOS 76 66  --  64  --   --   BILITOT 13.0* 11.9*  --  7.4*  --   --   PROT 7.5 7.4  --  7.0  --   --   ALBUMIN 2.9* 2.7*  2.8*   < > 2.6* 2.9* 2.9*   < > = values in this interval not displayed.   No results for input(s): "LIPASE", "AMYLASE" in the last 168 hours. No results for input(s): "AMMONIA" in the last 168 hours.  CBC: Recent Labs    12/13/23 1048 12/14/23 0240 12/15/23 0253 12/16/23 0417 12/17/23 0419  HGB 9.5* 9.2* 8.9* 9.5* 10.6*  MCV 99.7 96.4 99.3 101.6* 100.6*    Cardiac Enzymes: No results for input(s): "CKTOTAL", "CKMB", "CKMBINDEX", "TROPONINI" in the last 168 hours. CBG: Recent Labs  Lab 12/16/23 1546 12/16/23 1943 12/16/23 2322 12/17/23 0324 12/17/23 0822  GLUCAP 134* 137* 131* 166* 131*    Iron Studies: No results for input(s): "IRON", "TIBC", "TRANSFERRIN", "FERRITIN" in the last 72 hours. Studies/Results: No results found.   Medications: Infusions:    prismasol BGK 4/2.5 400 mL/hr at 12/17/23 0329    prismasol BGK 4/2.5 400 mL/hr at 12/17/23 0327   sodium chloride Stopped (12/11/23 1110)   sodium chloride 10 mL/hr at 12/17/23 0900   amiodarone 30 mg/hr (12/17/23 0900)   anticoagulant sodium citrate     feeding supplement (VITAL AF 1.2 CAL) Stopped (12/17/23 0725)   heparin Stopped (12/17/23 0846)   meropenem (MERREM) IV Stopped (12/17/23 0549)   norepinephrine (LEVOPHED) Adult infusion 7 mcg/min (12/17/23 0900)   prismasol BGK 4/2.5 1,500 mL/hr at 12/17/23 9604    Scheduled Medications:  Chlorhexidine Gluconate Cloth  6 each Topical Daily   darbepoetin (ARANESP) injection - NON-DIALYSIS  40 mcg Subcutaneous Q Tue-1800   feeding supplement  1 Container Oral TID BM   fiber supplement (BANATROL TF)  60 mL Per Tube TID   Gerhardt's butt cream   Topical BID   influenza vaccine adjuvanted  0.5 mL Intramuscular Tomorrow-1000   insulin aspart  0-24 Units Subcutaneous Q4H   linezolid  600 mg Per Tube Q12H   melatonin  3 mg Oral Q24H   midodrine  15 mg Per Tube Q8H   multivitamin  1 tablet Per Tube BID   sertraline  25 mg Oral Daily   Warfarin - Pharmacist Dosing Inpatient   Does not apply q1600    have reviewed scheduled and prn medications.  Physical Exam: General:NAD, comfortable Heart:normal rate, no rub Lungs: Reduced breath sound bilateral. Bilateral chest rise Abdomen:soft, Non-tender, non-distended Extremities: Peripheral  edema+, cool lower extremities  Dialysis Access: .  Right IJ temporary HD catheter was replaced by PCCM on 1/14. Bernestine Amass Raegan Sipp 12/17/2023,9:06 AM  LOS: 18 days

## 2023-12-17 NOTE — Progress Notes (Signed)
[address]       [place] [address]             [phone]      18 Days Post-Op  Procedure(s) (LRB): EXPLORATION POST OPERATIVE OPEN HEART (N/A)   Total Length of Stay:  LOS: 18 days    SUBJECTIVE: Nurse reported that she became SOB acutely this am Also with nausea and tube feeds had been stopped and restarted   Vitals:   12/17/23 0630 12/17/23 0645  BP: (!) 82/72 (!) 86/65  Pulse: 68 70  Resp: 20 (!) 22  Temp:    SpO2: 99% 96%    Intake/Output      01/16 0701 01/17 0700 01/17 0701 01/18 0700   P.O.     I.V. (mL/kg) 774.4 (10.6)    Other 30    NG/GT 1541.5    IV Piggyback 300    Total Intake(mL/kg) 2645.9 (36.1)    Stool 280    CRRT 4779    Total Output 5059    Net -2413.1              prismasol BGK 4/2.5 400 mL/hr at 12/17/23 0329    prismasol BGK 4/2.5 400 mL/hr at 12/17/23 0327   sodium chloride Stopped (12/11/23 1110)   amiodarone 30 mg/hr (12/17/23 0700)   anticoagulant sodium citrate     feeding supplement (VITAL AF 1.2 CAL) 55 mL/hr at 12/17/23 0700   heparin 950 Units/hr (12/17/23 0700)   meropenem (MERREM) IV Stopped (12/17/23 0549)   norepinephrine (LEVOPHED) Adult infusion 9 mcg/min (12/17/23 0700)   prismasol BGK 4/2.5 1,500 mL/hr at 12/17/23 0412    CBC    Component Value Date/Time   WBC 16.0 (H) 12/17/2023 0419   RBC 3.47 (L) 12/17/2023 0419   HGB 10.6 (L) 12/17/2023 0419   HCT 34.9 (L) 12/17/2023 0419   PLT 95 (L) 12/17/2023 0419   MCV 100.6 (H) 12/17/2023 0419   MCH 30.5 12/17/2023 0419   MCHC 30.4 12/17/2023 0419   RDW 23.7 (H) 12/17/2023 0419   LYMPHSABS 2.2 12/09/2023 1001   MONOABS 0.6 12/09/2023 1001   EOSABS 0.0 12/09/2023 1001   BASOSABS 0.2 (H) 12/09/2023 1001   CMP     Component Value Date/Time   NA 134 (L) 12/17/2023 0419   K 4.8 12/17/2023 0419   CL 98 12/17/2023 0419   CO2 24 12/17/2023 0419   GLUCOSE 152 (H) 12/17/2023 0419   BUN 21 12/17/2023 0419   CREATININE 1.14 (H)  12/17/2023 0419   CREATININE 1.30 (H) 04/29/2016 1144   CALCIUM 9.1 12/17/2023 0419   PROT 7.0 12/16/2023 0417   ALBUMIN 2.9 (L) 12/17/2023 0419   AST 35 12/16/2023 0417   ALT 45 (H) 12/16/2023 0417   ALKPHOS 64 12/16/2023 0417   BILITOT 7.4 (H) 12/16/2023 0417   GFRNONAA 51 (L) 12/17/2023 0419   GFRAA 42 (L) 08/19/2020 1304   ABG    Component Value Date/Time   PHART 7.414 12/05/2023 0948   PCO2ART 37.0 12/05/2023 0948   PO2ART 124 (H) 12/05/2023 0948   HCO3 23.7 12/05/2023 0948   TCO2 25 12/05/2023 0948   ACIDBASEDEF 1.0 12/05/2023 0948   O2SAT 54.2 12/17/2023 0419   CBG (last 3)  Recent Labs    12/16/23 1943 12/16/23 2322 12/17/23 0324  GLUCAP 137* 131* 166*  EXAM Lungs: Clear Card: Irr Ext: warm Neuro: intact Abd: soft nontender   ASSESSMENT: SP MV replacement TV repair Continues  to require Levophed. Coox mid 50s. To discuss RHC to define issues better Cont CRRT Start antidepressent Follow nausea for now   Eugenio Hoes, MD 12/17/2023

## 2023-12-17 NOTE — Progress Notes (Signed)
Nutrition Follow-up  DOCUMENTATION CODES:   Not applicable  INTERVENTION:   Calorie Count initiated to see if po intake improves at all with nocturnal TF only. Currently pt eating 0%, taking small sips of liquids with meds. Pt is not agreeable to oral nutrition supplements including Ensure or Boost  Liberalize back to REGULAR diet Add Mighty Shakes and Magic Cups to meal trays D/C Boost Breeze  Tube Feeding via Cortrak: 12 hours nocturnal feeding Vital AF 1.2 at 55 x 12 hours  Nocturnal TF provides 50 g of protein, 792 kcals and 535 mL of free water  Pro-Source TF 20 BID per tube, each packet provides 20 g of protein and 80 kcals  Increase Banatrol TF to QID, each packet contains 5g of soluble fiber  If nausea persists, recommend resuming reglan (short term) due to likely delayed gastric emptying  Add Zinc Sulfate 200 mg daily x 14 days as pt at risk for deficiency and could be contributing to pt's anorexia. Add Vitamin C 250 mg BID for wound healing as well  NUTRITION DIAGNOSIS:   Inadequate oral intake related to acute illness as evidenced by NPO status.  Being addressed via TF   GOAL:   Patient will meet greater than or equal to 90% of their needs  Progressing  MONITOR:   PO intake, Supplement acceptance, TF tolerance, Labs, Weight trends  REASON FOR ASSESSMENT:   Consult Enteral/tube feeding initiation and management, Assessment of nutrition requirement/status (CRRT)  ASSESSMENT:   73 yo admitted with severe MR/TR requiring MVR/TVR complicated by post op bleeding and shock. Pt intubated and CRRT initiated. PMH includes HTN, NICM, HFrEF (EF40-50%), HTN, CKD and hx of prior severe MR s/p mitral clip x 2 in 2020.   12/30 Admitted, severe MR, moderate TR, OR for MVR with mosaic porcine tissue prosthesis, TVR, post op cardiogenic shock, post op bleeding requiring return to OR for washout 12/31 Extubated 01/02 Re-Intubated, iNO started, CRRT initiated 01/03  Cortrak, trickle TF 01/09 US abdomen with dilated GB, no stones/sludge 01/05 Extubated 01/12 CRRT discontinued, diet advanced to 2g sodium 01/14 CRRT resumed  Pt remains on CRRT, levophed at 5, milrinone restarted.  TF on hold this AM for cath but pt also with nausea.   Pt alert on visit today. NPO this AM but now back on po diet. Pt reports she is not hungry, does not want anything to eat.  Pt has dealt with significant anorexia throughout entire admission. Pt does not appear even interested in attempting to eat, not even taking bites at meals. Pt is on a liberalized diet but cannot think of anything that sounds good to her. Of note, pt is not amenable to any oral nutrition supplements like Boost/Ensure  Labs: sodium 133 (L), potassium 5.4 (H), magnesium 2.7 (H), phosphorus 2.8 (wdl), CBGs 99-166 Meds: ss novolog   Diet Order:   Diet Order             Diet 2 gram sodium Fluid consistency: Thin; Fluid restriction: 1800 mL Fluid  Diet effective now                   EDUCATION NEEDS:   Not appropriate for education at this time  Skin:  Skin Assessment: Skin Integrity Issues: Skin Integrity Issues:: DTI DTI: L Buttocks Incisions: chest post sternotomy (closed)  Last BM:  1/17 via rectal tube, RN reports stool is starting to thicken, hopeful removal of rectal tube soon  Height:   Ht Readings from Last  1 Encounters:  12/04/23 5\' 3"  (1.6 m)    Weight:   Wt Readings from Last 1 Encounters:  12/17/23 73.2 kg     BMI:  Body mass index is 28.59 kg/m.  Estimated Nutritional Needs:   Kcal:  1500-1700 kcals  Protein:  90-115 g  Fluid:  1.5 L   Romelle Starcher MS, RDN, LDN, CNSC Registered Dietitian 3 Clinical Nutrition RD Inpatient Contact Info in Amion

## 2023-12-17 NOTE — Progress Notes (Signed)
Physical Therapy Treatment Patient Details Name: Vanessa [name] MRN: [id number] DOB: 23-May-1951 Today's Date: 12/17/2023   History of Present Illness 73 yo F admitted 12/30 with DOE, increasing MR and TR. L[facility] without CAD. 12/30 MVR and tricuspid valve repair. Intubated 1/2-1/5. 1/2 CRRT started/stopped on 1/12. Pt HD line was removed on 1/13 and CRRT was re-started on 1/14.  PMhx: HTN, NICM, HFrEF (EF40-45%), Pulmonary HTN, CKD and  severe MR s/p prior mitral clip x 2 in 2020.    PT Comments  Pt is making slow progress towards goals. Pt was limited by fatigue this session. Co-tx due to pt fatigue after poor night of sleeping and visit to cath lab this morning. Pt was Mod A for bed mobility and was able to sit EOB to perform there-ex with knee extensions, ankle pumps, ankle circles and hip abd. Due to pt current functional status, home set up and available assistance at home recommending skilled physical therapy services > 3 hours/day in order to address strength, balance and functional mobility to decrease risk for falls, injury, immobility, skin break down and re-hospitalization.      If plan is discharge home, recommend the following: A lot of help with walking and/or transfers;A lot of help with bathing/dressing/bathroom;Assistance with cooking/housework     Equipment Recommendations  Rolling walker (2 wheels);BSC/3in1       Precautions / Restrictions Precautions Precautions: Sternal;Fall;Other (comment) Precaution Booklet Issued: No Precaution Comments: cortrak, watch vitals, CRRT Restrictions Weight Bearing Restrictions Per Provider Order: Yes RUE Weight Bearing Per Provider Order: Non weight bearing LUE Weight Bearing Per Provider Order: Non weight bearing Other Position/Activity Restrictions: sternal precautions     Mobility  Bed Mobility Overal bed mobility: Needs Assistance Bed Mobility: Supine to Sit Rolling: Min assist, Mod assist Sidelying to sit: Mod assist, HOB  elevated     Sit to sidelying: Mod assist, +2 for physical assistance General bed mobility comments: pt able to bring LEs to EOB, HOB elevated, pt deferred holding heart pillow, modA for trunk elevation and used bed pad to bring hips to edge. Pt requires multi modal cueing to maintain sternal precautions. Pt started to lay flat at one point with total A to prevent laying posteriorly and sliding off EOB when asked to stand. Pt was encouraged to sit back up and lay to the side to prevent sliding of EOB . Pt was Min A rolling to the R then Mod A for rolling at end of session to change bed pad with multi modal cueing to maintain sternal precautions.    Transfers   General transfer comment: Pt deferred standing today due to fatigue.    Ambulation/Gait   General Gait Details: Pt deferred OOB activities.       Balance Overall balance assessment: Needs assistance Sitting-balance support: No upper extremity supported, Feet supported, Feet unsupported Sitting balance-Leahy Scale: Fair Sitting balance - Comments: CGA              Cognition Arousal: Alert Behavior During Therapy: WFL for tasks assessed/performed, Flat affect Overall Cognitive Status: No family/caregiver present to determine baseline cognitive functioning       Memory: Decreased recall of precautions   Safety/Judgement: Decreased awareness of safety     General Comments: pt able to verbalize precautions but needs cues to adhere functionally           General Comments General comments (skin integrity, edema, etc.): CRRT on the R. No alarms during session.      Pertinent Vitals/Pain  Pain Assessment Pain Assessment: No/denies pain     PT Goals (current goals can now be found in the care plan section) Acute Rehab PT Goals Patient Stated Goal: return home PT Goal Formulation: With patient Time For Goal Achievement: 12/20/23 Potential to Achieve Goals: Good Progress towards PT goals: Progressing toward  goals    Frequency    Min 1X/week      PT Plan  Continue with current POC     Co-evaluation PT/OT/SLP Co-Evaluation/Treatment: Yes Reason for Co-Treatment: Complexity of the patient's impairments (multi-system involvement) PT goals addressed during session: Mobility/safety with mobility;Strengthening/ROM;Balance        AM-PAC PT "6 Clicks" Mobility   Outcome Measure  Help needed turning from your back to your side while in a flat bed without using bedrails?: A Lot Help needed moving from lying on your back to sitting on the side of a flat bed without using bedrails?: A Lot Help needed moving to and from a bed to a chair (including a wheelchair)?: A Lot Help needed standing up from a chair using your arms (e.g., wheelchair or bedside chair)?: A Lot Help needed to walk in hospital room?: Total Help needed climbing 3-5 steps with a railing? : Total 6 Click Score: 10    End of Session Equipment Utilized During Treatment: Oxygen Activity Tolerance: Patient limited by fatigue Patient left: in bed;with call bell/phone within reach Nurse Communication: Mobility status PT Visit Diagnosis: Other abnormalities of gait and mobility (R26.89);Muscle weakness (generalized) (M62.81);Difficulty in walking, not elsewhere classified (R26.2)     Time: 1203-1226 PT Time Calculation (min) (ACUTE ONLY): 23 min  Charges:    $Therapeutic Activity: 8-22 mins PT General Charges $$ ACUTE PT VISIT: 1 Visit                    Harrel Carina, DPT, CLT  Acute Rehabilitation Services Office: [phone] (Secure chat preferred)    Claudia Desanctis 12/17/2023, 1:37 PM

## 2023-12-17 NOTE — Progress Notes (Addendum)
Patient ID: Vanessa [name], female   DOB: 1951/09/22, 73 y.o.   MRN: [id number]      Advanced Heart Failure Rounding Note  Cardiologist: Dr. Shirlee Latch  Chief Complaint: Anxious /Cardiogenic Shock  Subjective:   S/P MVR (tissue) and TV ring and same day return to OR for tamponade/bleeding. 1/2: Progressive volume overload with poor UOP, CVVH started but ended up re-intubated.   Extubated 12/05/23 1/12: CVVH stopped 1/13: iHD, HD line removed 1/14 CRRT Restarted.   On CRRT- pulling ~ 100 cc per hour.   NE up 10  mcg. CO-OX 54%.    Earlier this morning anxious. Complaining of nausea. Poor appetite.   Objective:   Weight Range: 73.2 kg Body mass index is 28.59 kg/m.   Vital Signs:   Temp:  [97.9 F (36.6 C)-98.3 F (36.8 C)] 98 F (36.7 C) (01/17 0322) Pulse Rate:  [50-107] 70 (01/17 0645) Resp:  [15-39] 22 (01/17 0645) BP: (71-121)/(28-103) 86/65 (01/17 0645) SpO2:  [89 %-100 %] 96 % (01/17 0645) Weight:  [73.2 kg] 73.2 kg (01/17 0436) Last BM Date : 12/16/23  Weight change: Filed Weights   12/15/23 0545 12/16/23 0500 12/17/23 0436  Weight: 79.5 kg 78.5 kg 73.2 kg   Intake/Output:   Intake/Output Summary (Last 24 hours) at 12/17/2023 0722 Last data filed at 12/17/2023 0700 Gross per 24 hour  Intake 2645.92 ml  Output 5059 ml  Net -2413.08 ml   CVP 3  Physical Exam  General:  Frail.  No resp difficulty. On CRRT  HEENT: + Cortrak  Neck: supple. no JVD. Carotids 2+ bilat; no bruits. No lymphadenopathy or thryomegaly appreciated. RIJ Cor: PMI nondisplaced. Irregular rate & rhythm. No rubs, gallops or murmurs. Lungs: clear on 2 liters  Abdomen: soft, nontender, nondistended. No hepatosplenomegaly. No bruits or masses. Good bowel sounds. Extremities: no cyanosis, clubbing, rash, edema Neuro: alert & orientedx3, cranial nerves grossly intact. moves all 4 extremities w/o difficulty. Affect flat   Telemetry    A fib 70-80s   Labs    CBC Recent Labs     12/16/23 0417 12/17/23 0419  WBC 15.5* 16.0*  HGB 9.5* 10.6*  HCT 31.3* 34.9*  MCV 101.6* 100.6*  PLT 89* 95*   Basic Metabolic Panel Recent Labs    82/95/62 0417 12/16/23 1601 12/17/23 0419  NA  --  133* 134*  K  --  5.2* 4.8  CL  --  99 98  CO2  --  24 24  GLUCOSE  --  169* 152*  BUN  --  22 21  CREATININE  --  1.22* 1.14*  CALCIUM  --  8.9 9.1  MG 2.6*  --  2.7*  PHOS  --  2.8 2.8   Liver Function Tests Recent Labs    12/16/23 0417 12/16/23 1601 12/17/23 0419  AST 35  --   --   ALT 45*  --   --   ALKPHOS 64  --   --   BILITOT 7.4*  --   --   PROT 7.0  --   --   ALBUMIN 2.6* 2.9* 2.9*   No results for input(s): "LIPASE", "AMYLASE" in the last 72 hours. Cardiac Enzymes No results for input(s): "CKTOTAL", "CKMB", "CKMBINDEX", "TROPONINI" in the last 72 hours.  BNP: BNP (last 3 results) Recent Labs    05/26/23 1610 08/26/23 1103 09/22/23 0905  BNP 778.0* 781.0* 926.0*   Other results:  Imaging   No results found.  Medications:   Scheduled Medications:  Chlorhexidine Gluconate Cloth  6 each Topical Daily   darbepoetin (ARANESP) injection - NON-DIALYSIS  40 mcg Subcutaneous Q Tue-1800   feeding supplement  1 Container Oral TID BM   fiber supplement (BANATROL TF)  60 mL Per Tube TID   Gerhardt's butt cream   Topical BID   influenza vaccine adjuvanted  0.5 mL Intramuscular Tomorrow-1000   insulin aspart  0-24 Units Subcutaneous Q4H   linezolid  600 mg Per Tube Q12H   midodrine  15 mg Per Tube Q8H   multivitamin  1 tablet Per Tube BID   Warfarin - Pharmacist Dosing Inpatient   Does not apply q1600    Infusions:   prismasol BGK 4/2.5 400 mL/hr at 12/17/23 0329    prismasol BGK 4/2.5 400 mL/hr at 12/17/23 0327   sodium chloride Stopped (12/11/23 1110)   amiodarone 30 mg/hr (12/17/23 0700)   anticoagulant sodium citrate     feeding supplement (VITAL AF 1.2 CAL) 55 mL/hr at 12/17/23 0700   heparin 950 Units/hr (12/17/23 0700)    meropenem (MERREM) IV Stopped (12/17/23 0549)   norepinephrine (LEVOPHED) Adult infusion 9 mcg/min (12/17/23 0700)   prismasol BGK 4/2.5 1,500 mL/hr at 12/17/23 0412    PRN Medications: sodium chloride, alteplase, anticoagulant sodium citrate, artificial tears, bisacodyl **OR** bisacodyl, heparin, heparin, HYDROmorphone (DILAUDID) injection, levalbuterol, lidocaine (PF), lidocaine-prilocaine, ondansetron (ZOFRAN) IV, mouth rinse, oxyCODONE, pentafluoroprop-tetrafluoroeth, pneumococcal 20-valent conjugate vaccine, sodium chloride, traZODone  Patient Profile   Vanessa [name] is a 73 y.o. with history of nonischemic cardiomyopathy and severe MR s/p Mitral Clip x2 (2020), pHTN, and CKD IIIb.   Assessment/Plan   1. Post Cardiotomy Cardiogenic & septic shock: Bioprosthetic MVR and tricuspid ring 11/29/23 with Dr. Leafy Ro. Pre-op cath with no significant CAD.  Pre-op echo with EF 40-45%, severe MR. Went back to the OR with post-op tamponade for clean-out.  Echo 12/31 showed EF 25-30%, moderate RV dysfunction, s/p bioprosthetic mitral valve replacement with trivial MR and tricuspid valve repair with trivial TR. Worsened on 1/2 and re-intubated in setting of volume overload, oliguric renal failure &  pulmonary hypertension. Extubated on 12/04/22. 1/13 CT C/A/P  moderate hemorrhagic pericardial effusion and 5.3 cm hematoma right heart border.  Repeat Echo no tamponade.  Requiring CRRT for volume removal.  CVVH stopped 1/12 then restarted 01/14 for volume removal. Overnight pulling ~ 100 per hour. CVP 3.   - NE at 10 mcg. CO-OX 54%.  - Continue midodrine 15 tid.  - Set up RHC to reassess hemodynamics. Orders placed.  Informed Consent   Shared Decision Making/Informed Consent The risks [stroke (1 in 1000), death (1 in 1000), kidney failure [usually temporary] (1 in 500), bleeding (1 in 200), allergic reaction [possibly serious] (1 in 200)], benefits (diagnostic support and management of coronary artery  disease) and alternatives of a cardiac catheterization were discussed in detail with Vanessa [name] and she is willing to proceed.    -2. Mitral regurgitation:  TEE in 11/19 with severe MR, restricted posterior leaflet.  She had Mitraclip x 2 in 1/20. Post-op echo showed mild MR and mild mitral stenosis.   Echo in 9/24 showed MV s/p Mitraclip x 2 with mean gradient 6 mmHg and severe mitral regurgitation. TEE was then done to evaluate, showing 2 Mitraclips in place with mean gradient 4 mmHg and severe eccentric MR from between the 2 clips with ERO 0.54 cm^2.  She was not thought to be a candidate for an additional Mitraclip due  to lack of room between the two existing clips. R/LHC (11/24) showed moderate mixed pulmonary arterial and pulmonary venous hypertension likely due to mitral valve regurgitation.  S/p MV replacement and Tricuspid repair on 11/29/23 with Dr. Leafy Ro.  -Echo stable  3. Ventricular ectopy:  Continue amio drip.  4. Pulmonary hypertension: Most recent pre-op RHC (11/24) with PVR 4.5, with moderate mixed pulmonary arterial and pulmonary venous hypertension likely due to mitral valve regurgitation. Now s/p MVR, PA pressure severely elevated post-op.  Suspect still mixed PAH/PVH with elevated PCWP.  NO was tried at 10 ppm but did not have much effect so now off.  Will hold off on pulmonary vasodilators; small clinical trials demonstrate no improvement and some markers of harm with PDE5i in PH in the setting of mitral valve disease.  5. 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 TSH was normal in 8/24. Follows with Endocrinology 6. AKI on CKD stage 3:  -1/14 CRRT restarted. Pulling ~ 100 per hour.  - Per Nephrology  7. Blood loss anemia - Hgb stable.  8. Complete heart block: She had post-op CHB. Resolved. Afib/flutter today 9. Hypoxic / Hypercapnic respiratory failure:  - Reintubated 01/02.  Extubated 01/05.  - Sats stable on 2L . 10. Atrial fibrillation:  -Back in Afib on  01/13.  - Rate controlled. Continue amio drip.  -  On Heparin drip + warfarin. INR 1.2  11. ID: Completed 7 day course of vanc/merrem with no obvious source of infection.  - 1/9 abdominal US with dilated gallbladder but no other evidence for cholecystitis.  T bili was up to 15. Continues to trend down.  - HD catheter removed 01/13 for line holiday. New line 01/14 for CVVH. - WBCS 16 K- On broad spectrum abx, merrem and zyvox 12. Thrombocytopenia:  - Platelets  95  - ? D/t sepsis - Has been on heparin gtt. Transitioning to warfarin 13. Deconditioning -Continue PT.  14. FEN: Getting tube feeds  Set up RHC this morning.    Amy Clegg NP-C  12/17/2023 7:22 AM  Patient seen with NP, agree with the above note.   I/Os net negative 2413 overnight with CVVH, weight below baseline.  CVP 5.  Co-ox 54% today, NE currently at 9.   Overnight, she had episode of nausea and dyspnea, NE was increased.   This morning, no dyspnea/nausea.   She remains in atrial fibrillation rate 90s on amiodarone gtt and heparin gtt.   General: NAD Neck: No JVD, no thyromegaly or thyroid nodule.  Lungs: Clear to auscultation bilaterally with normal respiratory effort. CV: Nondisplaced PMI.  Heart regular S1/S2, no S3/S4, no murmur.  No peripheral edema.   Abdomen: Soft, nontender, no hepatosplenomegaly, no distention.  Skin: Intact without lesions or rashes.  Neurologic: Alert and oriented x 3.  Psych: Normal affect. Extremities: No clubbing or cyanosis.  HEENT: Normal.   Suspect combined septic/cardiogenic shock.  WBCs stable.  Still on NE 9.  CVP not elevated and weight down to below baseline.  - Would be more gentle with CVVH today, run even for now.  Need to get her to intermittent HD.  Will work on getting NE dose down, hopefully can stop CVVH completely and start iHD soon.  - Wean norepinephrine.   - Continue midodrine  - Will formally assess hemodynamics/filling pressures with RHC today.  Discussed  risks/benefits with patient and she agrees to procedure.    Broad spectrum abx to continue => meropenem/linezolid.    She remains in  rate-controlled AF.  Platelets up slightly, think drop is due to sepsis.  She is on heparin gtt, warfarin started.   CRITICAL CARE Performed by: Marca Ancona  Total critical care time: 45 minutes  Critical care time was exclusive of separately billable procedures and treating other patients.  Critical care was necessary to treat or prevent imminent or life-threatening deterioration.  Critical care was time spent personally by me on the following activities: development of treatment plan with patient and/or surrogate as well as nursing, discussions with consultants, evaluation of patient's response to treatment, examination of patient, obtaining history from patient or surrogate, ordering and performing treatments and interventions, ordering and review of laboratory studies, ordering and review of radiographic studies, pulse oximetry and re-evaluation of patient's condition.  Marca Ancona 12/17/2023 8:13 AM

## 2023-12-17 NOTE — Progress Notes (Signed)
      [address]       [place] [address]             [phone]      Resting comfortably, nausea improved  BP 95/66   Pulse 90   Temp 97.7 F (36.5 C) (Axillary)   Resp 17   Ht 5\' 3"  (1.6 m)   Wt 73.2 kg   SpO2 97%   BMI 28.59 kg/m   Intake/Output Summary (Last 24 hours) at 12/17/2023 1621 Last data filed at 12/17/2023 1500 Gross per 24 hour  Intake 2055.63 ml  Output 3007 ml  Net -951.37 ml   RHC showed low filling pressures, low mixed venous- milrinone restarted Running even on CRRT  Gwendlyon Zumbro C. Dorris Fetch, MD [facility] [phone]

## 2023-12-17 NOTE — Interval H&P Note (Signed)
History and Physical Interval Note:  12/17/2023 9:45 AM  Vanessa [name]  has presented today for surgery, with the diagnosis of heart failure.  The various methods of treatment have been discussed with the patient and family. After consideration of risks, benefits and other options for treatment, the patient has consented to  Procedure(s): RIGHT HEART CATH (N/A) as a surgical intervention.  The patient's history has been reviewed, patient examined, no change in status, stable for surgery.  I have reviewed the patient's chart and labs.  Questions were answered to the patient's satisfaction.     Rodderick Holtzer [employer]

## 2023-12-17 NOTE — Progress Notes (Signed)
NAME:  Vanessa [name], MRN:  [id number], DOB:  Mar 09, 1951, LOS: 18 ADMISSION DATE:  11/29/2023, CONSULTATION DATE:  11/29/23 REFERRING MD:  Dr. Leafy Ro, CHIEF COMPLAINT:  postop   History of Present Illness:  72yoF with PMH significant for HTN, NICM, HFrEF (EF40-45%), PHTN, CKD and hx of prior severe MR s/p prior mitral clip x 2 in 2020.  Pt with progressive DOE found to have increasing MR  and moderate TR despite adjustments in GDMT.  Not felt to be candidate for additional mitral clip.  Repeat LHC without CAD but showed moderate PHTN.  Underwent mitral valve replacement with tissue valve and tricuspid valve repair by Dr. Leafy Ro on 12/30.  PCCM consulted to assist with vent and medical management post-operatively in ICU.   Pertinent  Medical History  Never smoker, MR w/ previous Mitral clip x 2 (2020), NICM, HFrEF, PHTN, HTN, CKD3b  Significant Hospital Events: Including procedures, antibiotic start and stop dates in addition to other pertinent events   MVR by Dr. Leafy Ro; relook for tamponade/washout later in evening without obvious source 1/2 intubated for respiratory decompensation, iNO started. Vanc, meropenem started. CRRT started.  1/5 extubated 1/8 converted to NSR overnight 1/9 back to A.fib around 0200 1/11 R introducer out.  Unable to thread right subclavian catheter 1/12 remains weak still pressor dependent 1/13 repeat [facility] sent. Still on pressors..,  Zosyn changed to meropenem.  Chest abdomen and pelvis CT obtained : Moderate hemorrhagic pericardial effusion, 5.3 cm hematoma right thrombus. Arterial gas bilateral patchy opacities mild dependent posterior right upper lobe atelectasis was unremarkable layering gallstones  1/14 new right IJ HD catheter placed.  The echocardiogram Was obtained to better evaluate her EF is 35 to 40%.  The LV demonstrates regional wall motion abnormalities there is asymmetric left ventricular hypertrophy of the inferior lateral segment RV systolic  function moderate regular reduced left atrial wall dilated no mention of clot.  1/15 still on fairly high-dose norepinephrine 1/16 cont CRRT + low dose NE  Interim History / Subjective:  Lat night wasn't a good night -- she had some anxiety and abd discomfort   She feels like she slept better than usual -- 1-2 hours continuously.   Feeling a bit defeated. Feels like she is just holding onto fluid no matter what she does   Objective   Blood pressure (!) 85/62, pulse 69, temperature 98.2 F (36.8 C), temperature source Oral, resp. rate (!) 26, height 5\' 3"  (1.6 m), weight 73.2 kg, SpO2 92%. CVP:  [2 mmHg-6 mmHg] 3 mmHg      Intake/Output Summary (Last 24 hours) at 12/17/2023 0955 Last data filed at 12/17/2023 0900 Gross per 24 hour  Intake 2506.97 ml  Output 4560 ml  Net -2053.03 ml   Filed Weights   12/15/23 0545 12/16/23 0500 12/17/23 0436  Weight: 79.5 kg 78.5 kg 73.2 kg   Examination:  General: elderly frail F  HEENT: NCAT cortrak secure  Pulmonary: CTAb even and unlabored  Cardiac : irir  Abdomen: soft ndnt  Extremities: No acute joint deformity. Some dependent edema  Neuro: AAOx4  Skin: c/d/w surgical site well approximated   Resolved Hospital Problem list :   Postoperative vent management Postop tamponade resolved after relook and washout on day 1 Assessment & Plan:   Sev Mitral regurg s/p prior mitral clip, now s/p MVR Mod tricuspid regurg s/p TV repair Shock, multifactorial with cardiac and distributive features  pHTN AoC HFrEF NICM pAF, flutter   P -for RHC 1/17 -NE  for MAP > 65 -midodrine  -CRRT as below  -presumed sepsis as below   Sepsis unclear source  P -cont mero linezolid -follow fever curve white count   Afib  P -Amio -warfarin   Acute renal failure  P -CRRT per nephro -goal is net neg   Hyperbilirubinemia  -probably congestion related; [place] was assuring and LFTs are ok  P -follow labs PRN   Protein calorie malnutrition Physical  deconditioning  Deep tissue injury on left buttocks approximately 2 cm in diameter purple discoloration P -local wound care -mobility -FMS  -poor appetite, cont EN   Situational depression Sleep disturbance P -starting SSRI 1/17 -- wouldn't expect this to be hugely helpful in immediate sense however -melatonin and seroquel at night  -delirium precautions  -PT/OT   Best Practice (right click and "Reselect all SmartList Selections" daily)   Diet/type: Encourage PO but her appetite has been poor and she is grossly reliant on EN still  DVT prophylaxis systemic heparin --> warfarin  Pressure ulcer(s): L buttocks  GI prophylaxis: N/A Lines: HD cath  Foley:  N/A Code Status:  full code Last date of multidisciplinary goals of care discussion [per primary]   CRITICAL CARE Performed by: Lanier Clam   Total critical care time: 37 minutes  Critical care time was exclusive of separately billable procedures and treating other patients. Critical care was necessary to treat or prevent imminent or life-threatening deterioration.  Critical care was time spent personally by me on the following activities: development of treatment plan with patient and/or surrogate as well as nursing, discussions with consultants, evaluation of patient's response to treatment, examination of patient, obtaining history from patient or surrogate, ordering and performing treatments and interventions, ordering and review of laboratory studies, ordering and review of radiographic studies, pulse oximetry and re-evaluation of patient's condition.  Tessie Fass MSN, AGACNP-BC [facility] Pulmonary/Critical Care Medicine Amion for pager  12/17/2023, 9:55 AM

## 2023-12-17 NOTE — Progress Notes (Signed)
Occupational Therapy Treatment Patient Details Name: Vanessa [name] MRN: [id number] DOB: Sep 01, 1951 Today's Date: 12/17/2023   History of present illness 73 yo F admitted 12/30 with DOE, increasing MR and TR. L[facility] without CAD. 12/30 MVR and tricuspid valve repair. Intubated 1/2-1/5. 1/2 CRRT started/stopped on 1/12. Pt HD line was removed on 1/13 and CRRT was re-started on 1/14.  PMhx: HTN, NICM, HFrEF (EF40-45%), Pulmonary HTN, CKD and  severe MR s/p prior mitral clip x 2 in 2020.   OT comments  Per discussion with RN, co-treat with PT due to pt fatigue with OT largely focused on AROM B UE therapeutic exercises within sternal precautions sitting EOB to increase activity tolerance, strength, sitting tolerance, and sitting balance for carryover to functional tasks. Pt currently demonstrates ability to complete bed mobility with Mod assist +1 to +2. OT also reviewed sternal precautions with pt, with pt able to Independently verbalize precautions but requiring cues to adhere to precautions throughout session. Pt participated well in session but limited by fatigue with pt reporting not sleeping well last night and fatigue following going to cath lab earlier today. Pt is making slow but consistent progress toward goals. Pt will benefit from continued acute skilled OT services to address deficits outlined below and increase safety and independence with functional tasks. Post acute discharge, pt will benefit from intensive inpatient skilled rehab services > 3 hours per day to maximize rehab potential.       If plan is discharge home, recommend the following:  Two people to help with walking and/or transfers;Two people to help with bathing/dressing/bathroom;Assistance with cooking/housework;Assist for transportation;Help with stairs or ramp for entrance   Equipment Recommendations  Other (comment) (defer to next level of care)    Recommendations for Other Services      Precautions / Restrictions  Precautions Precautions: Sternal;Fall;Other (comment) Precaution Booklet Issued: No Precaution Comments: cortrak, watch vitals, CRRT Restrictions Weight Bearing Restrictions Per Provider Order: Yes RUE Weight Bearing Per Provider Order: Non weight bearing LUE Weight Bearing Per Provider Order: Non weight bearing Other Position/Activity Restrictions: sternal precautions       Mobility Bed Mobility Overal bed mobility: Needs Assistance Bed Mobility: Rolling, Sidelying to Sit, Sit to Sidelying Rolling: Min assist, Mod assist Sidelying to sit: Mod assist, HOB elevated     Sit to sidelying: Mod assist, +2 for physical assistance General bed mobility comments: pt able to bring LEs to EOB, HOB elevated, pt deferred holding heart pillow, modA for trunk elevation and used bed pad to bring hips to edge. Pt requires multi modal cueing to maintain sternal precautions. Pt started to lay flat at one point with total A +2 to prevent laying posteriorly and sliding off EOB when asked to stand. Pt was encouraged to sit back up and lay to the side to prevent sliding of EOB . Pt was Min A rolling to the R then Mod A for rolling at end of session to change bed pad with multi modal cueing to maintain sternal precautions.    Transfers                   General transfer comment: Pt deferred standing today due to fatigue.     Balance Overall balance assessment: Needs assistance Sitting-balance support: No upper extremity supported, Feet supported, Feet unsupported Sitting balance-Leahy Scale: Fair Sitting balance - Comments: Pt requiring close Supervision to Contact guard assist to maintain static/dynamic balance sitting EOB  ADL either performed or assessed with clinical judgement   ADL Overall ADL's : Needs assistance/impaired                                       General ADL Comments: Pt declined addressing ADLs this session  secondary to having already performed ADLs with NT earlier this day. Pt reeducated in sternal precautions, including "moving in the tube" during ADLs, with pt verbalizing understanding but continuing to require cues to adhere to sternal precautions during bed mobility.    Extremity/Trunk Assessment Upper Extremity Assessment Upper Extremity Assessment: Right hand dominant;Generalized weakness (tested within sternal precautions)   Lower Extremity Assessment Lower Extremity Assessment: Defer to PT evaluation        Vision       Perception     Praxis      Cognition Arousal: Alert Behavior During Therapy: WFL for tasks assessed/performed, Flat affect Overall Cognitive Status: No family/caregiver present to determine baseline cognitive functioning Area of Impairment: Memory, Problem solving, Awareness, Safety/judgement                     Memory: Decreased recall of precautions   Safety/Judgement: Decreased awareness of safety Awareness: Emergent Problem Solving: Slow processing General Comments: AAOx4 with pt able to verbalize precautions but needs cues to adhere functionally.        Exercises Exercises: General Upper Extremity, Hand exercises General Exercises - Upper Extremity Shoulder Flexion: AROM, Both, 10 reps, Seated (sitting EOB; to 90 degrees to maintain sternal precations; maintain ROM; increased activity tolerance; increased sitting tolerance and sitting balance for carryover to funcitonal tasks) Shoulder Extension: AROM, Strengthening, Both, 10 reps, Seated (sitting EOB; within sternal precations; maintain ROM; increased activity tolerance; increased sitting tolerance and sitting balance for carryover to funcitonal tasks) Elbow Flexion: AROM, Strengthening, Both, 10 reps, Seated (sitting EOB; maintain ROM; increased activity tolerance; increased sitting tolerance and sitting balance for carryover to funcitonal tasks) Elbow Extension: AROM, Strengthening, Both,  10 reps, Seated (sitting EOB; maintain ROM; increased activity tolerance; increased sitting tolerance and sitting balance for carryover to funcitonal tasks) Wrist Flexion: AROM, Strengthening, Both, 10 reps, Seated (sitting EOB; maintain ROM; increased activity tolerance; increased sitting tolerance and sitting balance for carryover to funcitonal tasks) Wrist Extension: AROM, Strengthening, Both, 10 reps, Seated (sitting EOB; maintain ROM; increased activity tolerance; increased sitting tolerance and sitting balance for carryover to funcitonal tasks) Digit Composite Flexion: AROM, Both, 10 reps, Seated (sitting EOB; maintain ROM; increased activity tolerance; increased sitting tolerance and sitting balance for carryover to funcitonal tasks) Composite Extension: AROM, Both, 10 reps, Seated (sitting EOB; maintain ROM; increased activity tolerance; increased sitting tolerance and sitting balance for carryover to funcitonal tasks) Hand Exercises Forearm Supination: AROM, Strengthening, Both, 10 reps, Seated (sitting EOB; maintain ROM; increased activity tolerance; increased sitting tolerance and sitting balance for carryover to funcitonal tasks) Forearm Pronation: AROM, Strengthening, Both, 10 reps, Seated (sitting EOB; maintain ROM; increased activity tolerance; increased sitting tolerance and sitting balance for carryover to funcitonal tasks) Wrist Ulnar Deviation: AROM, Strengthening, Both, 10 reps, Seated (sitting EOB; maintain ROM; increased activity tolerance; increased sitting tolerance and sitting balance for carryover to funcitonal tasks) Wrist Radial Deviation: AROM, Strengthening, Both, 10 reps, Seated (sitting EOB; maintain ROM; increased activity tolerance; increased sitting tolerance and sitting balance for carryover to funcitonal tasks)    Shoulder Instructions       General  Comments CRRT on the R. No alarms during session.    Pertinent Vitals/ Pain       Pain Assessment Pain  Assessment: No/denies pain Pain Intervention(s): Monitored during session  Home Living                                          Prior Functioning/Environment              Frequency  Min 1X/week        Progress Toward Goals  OT Goals(current goals can now be found in the care plan section)  Progress towards OT goals: Progressing toward goals  Acute Rehab OT Goals Patient Stated Goal: to be able to do more  Plan      Co-evaluation    PT/OT/SLP Co-Evaluation/Treatment: Yes Reason for Co-Treatment: Complexity of the patient's impairments (multi-system involvement) PT goals addressed during session: Mobility/safety with mobility;Strengthening/ROM;Balance OT goals addressed during session: Strengthening/ROM      AM-PAC OT "6 Clicks" Daily Activity     Outcome Measure   Help from another person eating meals?: Total (NPO) Help from another person taking care of personal grooming?: A Little Help from another person toileting, which includes using toliet, bedpan, or urinal?: A Lot Help from another person bathing (including washing, rinsing, drying)?: A Lot Help from another person to put on and taking off regular upper body clothing?: A Lot Help from another person to put on and taking off regular lower body clothing?: A Lot 6 Click Score: 12    End of Session Equipment Utilized During Treatment: Oxygen  OT Visit Diagnosis: Muscle weakness (generalized) (M62.81);Other (comment) (decreased activity tolerance)   Activity Tolerance Patient tolerated treatment well;Patient limited by fatigue   Patient Left in bed;with call bell/phone within reach;with bed alarm set   Nurse Communication Mobility status;Other (comment) (Pt dangled EOB and participated in therapeutic exercises)        Time: 1203-1227 OT Time Calculation (min): 24 min  Charges: OT General Charges $OT Visit: 1 Visit OT Treatments $Therapeutic Exercise: 8-22 mins  Gayland Nicol "Orson Eva., OTR/L, MA Acute Rehab [phone]   Lendon Colonel 12/17/2023, 2:53 PM

## 2023-12-17 NOTE — H&P (View-Only) (Signed)
Patient ID: Vanessa [name], female   DOB: 1951/09/22, 73 y.o.   MRN: [id number]      Advanced Heart Failure Rounding Note  Cardiologist: Dr. Shirlee Latch  Chief Complaint: Anxious /Cardiogenic Shock  Subjective:   S/P MVR (tissue) and TV ring and same day return to OR for tamponade/bleeding. 1/2: Progressive volume overload with poor UOP, CVVH started but ended up re-intubated.   Extubated 12/05/23 1/12: CVVH stopped 1/13: iHD, HD line removed 1/14 CRRT Restarted.   On CRRT- pulling ~ 100 cc per hour.   NE up 10  mcg. CO-OX 54%.    Earlier this morning anxious. Complaining of nausea. Poor appetite.   Objective:   Weight Range: 73.2 kg Body mass index is 28.59 kg/m.   Vital Signs:   Temp:  [97.9 F (36.6 C)-98.3 F (36.8 C)] 98 F (36.7 C) (01/17 0322) Pulse Rate:  [50-107] 70 (01/17 0645) Resp:  [15-39] 22 (01/17 0645) BP: (71-121)/(28-103) 86/65 (01/17 0645) SpO2:  [89 %-100 %] 96 % (01/17 0645) Weight:  [73.2 kg] 73.2 kg (01/17 0436) Last BM Date : 12/16/23  Weight change: Filed Weights   12/15/23 0545 12/16/23 0500 12/17/23 0436  Weight: 79.5 kg 78.5 kg 73.2 kg   Intake/Output:   Intake/Output Summary (Last 24 hours) at 12/17/2023 0722 Last data filed at 12/17/2023 0700 Gross per 24 hour  Intake 2645.92 ml  Output 5059 ml  Net -2413.08 ml   CVP 3  Physical Exam  General:  Frail.  No resp difficulty. On CRRT  HEENT: + Cortrak  Neck: supple. no JVD. Carotids 2+ bilat; no bruits. No lymphadenopathy or thryomegaly appreciated. RIJ Cor: PMI nondisplaced. Irregular rate & rhythm. No rubs, gallops or murmurs. Lungs: clear on 2 liters  Abdomen: soft, nontender, nondistended. No hepatosplenomegaly. No bruits or masses. Good bowel sounds. Extremities: no cyanosis, clubbing, rash, edema Neuro: alert & orientedx3, cranial nerves grossly intact. moves all 4 extremities w/o difficulty. Affect flat   Telemetry    A fib 70-80s   Labs    CBC Recent Labs     12/16/23 0417 12/17/23 0419  WBC 15.5* 16.0*  HGB 9.5* 10.6*  HCT 31.3* 34.9*  MCV 101.6* 100.6*  PLT 89* 95*   Basic Metabolic Panel Recent Labs    82/95/62 0417 12/16/23 1601 12/17/23 0419  NA  --  133* 134*  K  --  5.2* 4.8  CL  --  99 98  CO2  --  24 24  GLUCOSE  --  169* 152*  BUN  --  22 21  CREATININE  --  1.22* 1.14*  CALCIUM  --  8.9 9.1  MG 2.6*  --  2.7*  PHOS  --  2.8 2.8   Liver Function Tests Recent Labs    12/16/23 0417 12/16/23 1601 12/17/23 0419  AST 35  --   --   ALT 45*  --   --   ALKPHOS 64  --   --   BILITOT 7.4*  --   --   PROT 7.0  --   --   ALBUMIN 2.6* 2.9* 2.9*   No results for input(s): "LIPASE", "AMYLASE" in the last 72 hours. Cardiac Enzymes No results for input(s): "CKTOTAL", "CKMB", "CKMBINDEX", "TROPONINI" in the last 72 hours.  BNP: BNP (last 3 results) Recent Labs    05/26/23 1610 08/26/23 1103 09/22/23 0905  BNP 778.0* 781.0* 926.0*   Other results:  Imaging   No results found.  Medications:   Scheduled Medications:  Chlorhexidine Gluconate Cloth  6 each Topical Daily   darbepoetin (ARANESP) injection - NON-DIALYSIS  40 mcg Subcutaneous Q Tue-1800   feeding supplement  1 Container Oral TID BM   fiber supplement (BANATROL TF)  60 mL Per Tube TID   Gerhardt's butt cream   Topical BID   influenza vaccine adjuvanted  0.5 mL Intramuscular Tomorrow-1000   insulin aspart  0-24 Units Subcutaneous Q4H   linezolid  600 mg Per Tube Q12H   midodrine  15 mg Per Tube Q8H   multivitamin  1 tablet Per Tube BID   Warfarin - Pharmacist Dosing Inpatient   Does not apply q1600    Infusions:   prismasol BGK 4/2.5 400 mL/hr at 12/17/23 0329    prismasol BGK 4/2.5 400 mL/hr at 12/17/23 0327   sodium chloride Stopped (12/11/23 1110)   amiodarone 30 mg/hr (12/17/23 0700)   anticoagulant sodium citrate     feeding supplement (VITAL AF 1.2 CAL) 55 mL/hr at 12/17/23 0700   heparin 950 Units/hr (12/17/23 0700)    meropenem (MERREM) IV Stopped (12/17/23 0549)   norepinephrine (LEVOPHED) Adult infusion 9 mcg/min (12/17/23 0700)   prismasol BGK 4/2.5 1,500 mL/hr at 12/17/23 0412    PRN Medications: sodium chloride, alteplase, anticoagulant sodium citrate, artificial tears, bisacodyl **OR** bisacodyl, heparin, heparin, HYDROmorphone (DILAUDID) injection, levalbuterol, lidocaine (PF), lidocaine-prilocaine, ondansetron (ZOFRAN) IV, mouth rinse, oxyCODONE, pentafluoroprop-tetrafluoroeth, pneumococcal 20-valent conjugate vaccine, sodium chloride, traZODone  Patient Profile   KHRYSTYN [name] is a 73 y.o. with history of nonischemic cardiomyopathy and severe MR s/p Mitral Clip x2 (2020), pHTN, and CKD IIIb.   Assessment/Plan   1. Post Cardiotomy Cardiogenic & septic shock: Bioprosthetic MVR and tricuspid ring 11/29/23 with Dr. Leafy Ro. Pre-op cath with no significant CAD.  Pre-op echo with EF 40-45%, severe MR. Went back to the OR with post-op tamponade for clean-out.  Echo 12/31 showed EF 25-30%, moderate RV dysfunction, s/p bioprosthetic mitral valve replacement with trivial MR and tricuspid valve repair with trivial TR. Worsened on 1/2 and re-intubated in setting of volume overload, oliguric renal failure &  pulmonary hypertension. Extubated on 12/04/22. 1/13 CT C/A/P  moderate hemorrhagic pericardial effusion and 5.3 cm hematoma right heart border.  Repeat Echo no tamponade.  Requiring CRRT for volume removal.  CVVH stopped 1/12 then restarted 01/14 for volume removal. Overnight pulling ~ 100 per hour. CVP 3.   - NE at 10 mcg. CO-OX 54%.  - Continue midodrine 15 tid.  - Set up RHC to reassess hemodynamics. Orders placed.  Informed Consent   Shared Decision Making/Informed Consent The risks [stroke (1 in 1000), death (1 in 1000), kidney failure [usually temporary] (1 in 500), bleeding (1 in 200), allergic reaction [possibly serious] (1 in 200)], benefits (diagnostic support and management of coronary artery  disease) and alternatives of a cardiac catheterization were discussed in detail with Ms. Karan and she is willing to proceed.    -2. Mitral regurgitation:  TEE in 11/19 with severe MR, restricted posterior leaflet.  She had Mitraclip x 2 in 1/20. Post-op echo showed mild MR and mild mitral stenosis.   Echo in 9/24 showed MV s/p Mitraclip x 2 with mean gradient 6 mmHg and severe mitral regurgitation. TEE was then done to evaluate, showing 2 Mitraclips in place with mean gradient 4 mmHg and severe eccentric MR from between the 2 clips with ERO 0.54 cm^2.  She was not thought to be a candidate for an additional Mitraclip due  to lack of room between the two existing clips. R/LHC (11/24) showed moderate mixed pulmonary arterial and pulmonary venous hypertension likely due to mitral valve regurgitation.  S/p MV replacement and Tricuspid repair on 11/29/23 with Dr. Leafy Ro.  -Echo stable  3. Ventricular ectopy:  Continue amio drip.  4. Pulmonary hypertension: Most recent pre-op RHC (11/24) with PVR 4.5, with moderate mixed pulmonary arterial and pulmonary venous hypertension likely due to mitral valve regurgitation. Now s/p MVR, PA pressure severely elevated post-op.  Suspect still mixed PAH/PVH with elevated PCWP.  NO was tried at 10 ppm but did not have much effect so now off.  Will hold off on pulmonary vasodilators; small clinical trials demonstrate no improvement and some markers of harm with PDE5i in PH in the setting of mitral valve disease.  5. 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 TSH was normal in 8/24. Follows with Endocrinology 6. AKI on CKD stage 3:  -1/14 CRRT restarted. Pulling ~ 100 per hour.  - Per Nephrology  7. Blood loss anemia - Hgb stable.  8. Complete heart block: She had post-op CHB. Resolved. Afib/flutter today 9. Hypoxic / Hypercapnic respiratory failure:  - Reintubated 01/02.  Extubated 01/05.  - Sats stable on 2L . 10. Atrial fibrillation:  -Back in Afib on  01/13.  - Rate controlled. Continue amio drip.  -  On Heparin drip + warfarin. INR 1.2  11. ID: Completed 7 day course of vanc/merrem with no obvious source of infection.  - 1/9 abdominal US with dilated gallbladder but no other evidence for cholecystitis.  T bili was up to 15. Continues to trend down.  - HD catheter removed 01/13 for line holiday. New line 01/14 for CVVH. - WBCS 16 K- On broad spectrum abx, merrem and zyvox 12. Thrombocytopenia:  - Platelets  95  - ? D/t sepsis - Has been on heparin gtt. Transitioning to warfarin 13. Deconditioning -Continue PT.  14. FEN: Getting tube feeds  Set up RHC this morning.    Amy Clegg NP-C  12/17/2023 7:22 AM  Patient seen with NP, agree with the above note.   I/Os net negative 2413 overnight with CVVH, weight below baseline.  CVP 5.  Co-ox 54% today, NE currently at 9.   Overnight, she had episode of nausea and dyspnea, NE was increased.   This morning, no dyspnea/nausea.   She remains in atrial fibrillation rate 90s on amiodarone gtt and heparin gtt.   General: NAD Neck: No JVD, no thyromegaly or thyroid nodule.  Lungs: Clear to auscultation bilaterally with normal respiratory effort. CV: Nondisplaced PMI.  Heart regular S1/S2, no S3/S4, no murmur.  No peripheral edema.   Abdomen: Soft, nontender, no hepatosplenomegaly, no distention.  Skin: Intact without lesions or rashes.  Neurologic: Alert and oriented x 3.  Psych: Normal affect. Extremities: No clubbing or cyanosis.  HEENT: Normal.   Suspect combined septic/cardiogenic shock.  WBCs stable.  Still on NE 9.  CVP not elevated and weight down to below baseline.  - Would be more gentle with CVVH today, run even for now.  Need to get her to intermittent HD.  Will work on getting NE dose down, hopefully can stop CVVH completely and start iHD soon.  - Wean norepinephrine.   - Continue midodrine  - Will formally assess hemodynamics/filling pressures with RHC today.  Discussed  risks/benefits with patient and she agrees to procedure.    Broad spectrum abx to continue => meropenem/linezolid.    She remains in  rate-controlled AF.  Platelets up slightly, think drop is due to sepsis.  She is on heparin gtt, warfarin started.   CRITICAL CARE Performed by: Marca Ancona  Total critical care time: 45 minutes  Critical care time was exclusive of separately billable procedures and treating other patients.  Critical care was necessary to treat or prevent imminent or life-threatening deterioration.  Critical care was time spent personally by me on the following activities: development of treatment plan with patient and/or surrogate as well as nursing, discussions with consultants, evaluation of patient's response to treatment, examination of patient, obtaining history from patient or surrogate, ordering and performing treatments and interventions, ordering and review of laboratory studies, ordering and review of radiographic studies, pulse oximetry and re-evaluation of patient's condition.  Marca Ancona 12/17/2023 8:13 AM

## 2023-12-18 DIAGNOSIS — I272 Pulmonary hypertension, unspecified: Secondary | ICD-10-CM | POA: Diagnosis not present

## 2023-12-18 DIAGNOSIS — I340 Nonrheumatic mitral (valve) insufficiency: Secondary | ICD-10-CM | POA: Diagnosis not present

## 2023-12-18 DIAGNOSIS — R570 Cardiogenic shock: Secondary | ICD-10-CM | POA: Diagnosis not present

## 2023-12-18 DIAGNOSIS — Z952 Presence of prosthetic heart valve: Secondary | ICD-10-CM | POA: Diagnosis not present

## 2023-12-18 LAB — CULTURE, BLOOD (ROUTINE X 2)
Culture: NO GROWTH
Culture: NO GROWTH

## 2023-12-18 LAB — GLUCOSE, CAPILLARY
Glucose-Capillary: 118 mg/dL — ABNORMAL HIGH (ref 70–99)
Glucose-Capillary: 126 mg/dL — ABNORMAL HIGH (ref 70–99)
Glucose-Capillary: 126 mg/dL — ABNORMAL HIGH (ref 70–99)
Glucose-Capillary: 133 mg/dL — ABNORMAL HIGH (ref 70–99)
Glucose-Capillary: 133 mg/dL — ABNORMAL HIGH (ref 70–99)
Glucose-Capillary: 141 mg/dL — ABNORMAL HIGH (ref 70–99)
Glucose-Capillary: >600 mg/dL (ref 70–99)

## 2023-12-18 LAB — RENAL FUNCTION PANEL
Albumin: 2.8 g/dL — ABNORMAL LOW (ref 3.5–5.0)
Albumin: 2.7 g/dL — ABNORMAL LOW (ref 3.5–5.0)
Anion gap: 10 (ref 5–15)
Anion gap: 9 (ref 5–15)
BUN: 23 mg/dL (ref 8–23)
BUN: 19 mg/dL (ref 8–23)
CO2: 24 mmol/L (ref 22–32)
CO2: 25 mmol/L (ref 22–32)
Calcium: 8.5 mg/dL — ABNORMAL LOW (ref 8.9–10.3)
Calcium: 8.4 mg/dL — ABNORMAL LOW (ref 8.9–10.3)
Chloride: 97 mmol/L — ABNORMAL LOW (ref 98–111)
Chloride: 99 mmol/L (ref 98–111)
Creatinine, Ser: 1.2 mg/dL — ABNORMAL HIGH (ref 0.44–1.00)
Creatinine, Ser: 1.27 mg/dL — ABNORMAL HIGH (ref 0.44–1.00)
GFR, Estimated: 48 mL/min — ABNORMAL LOW (ref >60)
GFR, Estimated: 45 mL/min — ABNORMAL LOW (ref >60)
Glucose, Bld: 175 mg/dL — ABNORMAL HIGH (ref 70–99)
Glucose, Bld: 137 mg/dL — ABNORMAL HIGH (ref 70–99)
Phosphorus: 3.2 mg/dL (ref 2.5–4.6)
Phosphorus: 2.7 mg/dL (ref 2.5–4.6)
Potassium: 5 mmol/L (ref 3.5–5.1)
Potassium: 4.9 mmol/L (ref 3.5–5.1)
Sodium: 131 mmol/L — ABNORMAL LOW (ref 135–145)
Sodium: 133 mmol/L — ABNORMAL LOW (ref 135–145)

## 2023-12-18 LAB — MAGNESIUM: Magnesium: 2.8 mg/dL — ABNORMAL HIGH (ref 1.7–2.4)

## 2023-12-18 LAB — APTT: aPTT: 61 s — ABNORMAL HIGH (ref 24–36)

## 2023-12-18 LAB — CBC
HCT: 33.1 % — ABNORMAL LOW (ref 36.0–46.0)
Hemoglobin: 10.3 g/dL — ABNORMAL LOW (ref 12.0–15.0)
MCH: 32.1 pg (ref 26.0–34.0)
MCHC: 31.1 g/dL (ref 30.0–36.0)
MCV: 103.1 fL — ABNORMAL HIGH (ref 80.0–100.0)
Platelets: 93 10*3/uL — ABNORMAL LOW (ref 150–400)
RBC: 3.21 MIL/uL — ABNORMAL LOW (ref 3.87–5.11)
RDW: 23.8 % — ABNORMAL HIGH (ref 11.5–15.5)
WBC: 18.7 10*3/uL — ABNORMAL HIGH (ref 4.0–10.5)
nRBC: 3.1 % — ABNORMAL HIGH (ref 0.0–0.2)

## 2023-12-18 LAB — COOXEMETRY PANEL
Carboxyhemoglobin: 2.1 % — ABNORMAL HIGH (ref 0.5–1.5)
Methemoglobin: <0.7 % (ref 0.0–1.5)
O2 Saturation: 55.3 %
Total hemoglobin: 11 g/dL — ABNORMAL LOW (ref 12.0–16.0)

## 2023-12-18 LAB — PROTIME-INR
INR: 1.2 (ref 0.8–1.2)
Prothrombin Time: 15.1 s (ref 11.4–15.2)

## 2023-12-18 LAB — HEPARIN LEVEL (UNFRACTIONATED): Heparin Unfractionated: 0.37 [IU]/mL (ref 0.30–0.70)

## 2023-12-18 MED ORDER — AMIODARONE HCL 200 MG PO TABS
200.0000 mg | ORAL_TABLET | Freq: Two times a day (BID) | ORAL | Status: DC
  Administered 2023-12-18 – 2023-12-30 (×26): 200 mg via ORAL
  Filled 2023-12-18 (×26): qty 1

## 2023-12-18 MED ORDER — WARFARIN SODIUM 2.5 MG PO TABS
2.5000 mg | ORAL_TABLET | Freq: Once | ORAL | Status: AC
  Administered 2023-12-18: 2.5 mg via ORAL
  Filled 2023-12-18: qty 1

## 2023-12-18 NOTE — Progress Notes (Signed)
1 Day Post-Op Procedure(s) (LRB): RIGHT HEART CATH (N/A) Subjective: Doesn't feel well, denies pain and nausea  Objective: Vital signs in last 24 hours: Temp:  [97.5 F (36.4 C)-98.2 F (36.8 C)] 97.9 F (36.6 C) (01/18 0700) Pulse Rate:  [45-121] 96 (01/18 0645) Cardiac Rhythm: Atrial fibrillation (01/17 2000) Resp:  [12-47] 24 (01/18 0645) BP: (67-127)/(40-115) 86/63 (01/18 0645) SpO2:  [73 %-100 %] 98 % (01/18 0645) Weight:  [72.4 kg] 72.4 kg (01/18 0634)  Hemodynamic parameters for last 24 hours: CVP:  [1 mmHg-7 mmHg] 4 mmHg  Intake/Output from previous day: 01/17 0701 - 01/18 0700 In: 2333.5 [I.V.:795.5; NG/GT:1147.9; IV Piggyback:300.1] Out: 2251 [Stool:460] Intake/Output this shift: No intake/output data recorded.  General appearance: alert, cooperative, and no distress Neurologic: intact Heart: irregularly irregular rhythm Lungs: diminished breath sounds bilaterally Abdomen: normal findings: soft, non-tender  Lab Results: Recent Labs    12/17/23 0419 12/17/23 1009 12/18/23 0419  WBC 16.0*  --  18.7*  HGB 10.6* 12.6  12.2 10.3*  HCT 34.9* 37.0  36.0 33.1*  PLT 95*  --  93*   BMET:  Recent Labs    12/17/23 1625 12/18/23 0419  NA 133* 131*  K 5.2* 5.0  CL 97* 97*  CO2 23 24  GLUCOSE 214* 175*  BUN 22 23  CREATININE 1.34* 1.20*  CALCIUM 8.5* 8.5*    PT/INR:  Recent Labs    12/18/23 0419  LABPROT 15.1  INR 1.2   ABG    Component Value Date/Time   PHART 7.414 12/05/2023 0948   HCO3 27.4 12/17/2023 1009   HCO3 28.6 (H) 12/17/2023 1009   TCO2 29 12/17/2023 1009   TCO2 30 12/17/2023 1009   ACIDBASEDEF 1.0 12/05/2023 0948   O2SAT 55.3 12/18/2023 0419   CBG (last 3)  Recent Labs    12/17/23 1941 12/17/23 2359 12/18/23 0405  GLUCAP 171* 118* 141*    Assessment/Plan: S/P Procedure(s) (LRB): RIGHT HEART CATH (N/A) Slow to progress NEURO- intact  Started on Zoloft yesterday CV- started back on milrinone yesterday at 0.125  Co-ox  improved to 55  Increased norepi to 11  CVp remains low  Afib- on IV amiodarone  On warfarin, INR 1.2, PTT 61 on heparin drip RESP- IS for atelectasis RENAL- on CRRT running even  K= 5, not acidotic ENDO- CBG moderately elevated GI- poor Po intake  On nocturnal TF ID- on meropenem, linezolid  WBC trending up Deconditioning- severe- PT/OT   LOS: 19 days    Vanessa [name] 12/18/2023

## 2023-12-18 NOTE — Progress Notes (Signed)
      [address]       [place] [address]             [phone]      No new issues BP (!) 93/54   Pulse 92   Temp 97.7 F (36.5 C) (Oral)   Resp (!) 22   Ht 5\' 3"  (1.6 m)   Wt 72.4 kg   SpO2 95%   BMI 28.27 kg/m  CVP 3 Norepi 9  Intake/Output Summary (Last 24 hours) at 12/18/2023 1736 Last data filed at 12/18/2023 1700 Gross per 24 hour  Intake 2450.77 ml  Output 2260.9 ml  Net 189.87 ml   Continue current treatments  Viviann Spare C. Dorris Fetch, MD [facility] [phone]

## 2023-12-18 NOTE — Progress Notes (Signed)
Pt danged on the side of the bed and stood pt at the bedside. X4 assist, pt noncompliant with instructions and unwilling to walk to the chair and stated that she just wanted to get back in the bed. Pt noted to have a flat affect and seemingly withdrawn. Vital signs stable throughout. Of note that pt recently started on antidepressant medication. Levon Hedger, MD aware.

## 2023-12-18 NOTE — Progress Notes (Signed)
NAME:  Vanessa [name], MRN:  [id number], DOB:  Aug 09, 1951, LOS: 19 ADMISSION DATE:  11/29/2023, CONSULTATION DATE:  11/29/23 REFERRING MD:  Dr. Leafy Ro, CHIEF COMPLAINT:  postop   History of Present Illness:  72yoF with PMH significant for HTN, NICM, HFrEF (EF40-45%), PHTN, CKD and hx of prior severe MR s/p prior mitral clip x 2 in 2020.  Pt with progressive DOE found to have increasing MR  and moderate TR despite adjustments in GDMT.  Not felt to be candidate for additional mitral clip.  Repeat LHC without CAD but showed moderate PHTN.  Underwent mitral valve replacement with tissue valve and tricuspid valve repair by Dr. Leafy Ro on 12/30.  PCCM consulted to assist with vent and medical management post-operatively in ICU.   Pertinent  Medical History  Never smoker, MR w/ previous Mitral clip x 2 (2020), NICM, HFrEF, PHTN, HTN, CKD3b  Significant Hospital Events: Including procedures, antibiotic start and stop dates in addition to other pertinent events   MVR by Dr. Leafy Ro; relook for tamponade/washout later in evening without obvious source 1/2 intubated for respiratory decompensation, iNO started. Vanc, meropenem started. CRRT started.  1/5 extubated 1/8 converted to NSR overnight 1/9 back to A.fib around 0200 1/11 R introducer out.  Unable to thread right subclavian catheter 1/12 remains weak still pressor dependent 1/13 repeat [facility] sent. Still on pressors..,  Zosyn changed to meropenem.  Chest abdomen and pelvis CT obtained : Moderate hemorrhagic pericardial effusion, 5.3 cm hematoma right thrombus. Arterial gas bilateral patchy opacities mild dependent posterior right upper lobe atelectasis was unremarkable layering gallstones  1/14 new right IJ HD catheter placed.  The echocardiogram Was obtained to better evaluate her EF is 35 to 40%.  The LV demonstrates regional wall motion abnormalities there is asymmetric left ventricular hypertrophy of the inferior lateral segment RV systolic  function moderate regular reduced left atrial wall dilated no mention of clot.  1/15 still on fairly high-dose norepinephrine 1/16 cont CRRT + low dose NE  1/17 RHC confirms low output state, restarted on milrinone Interim History / Subjective:  No events. Slept a little better Remains on pressors, inotropes, CRRT  Objective   Blood pressure (!) 86/63, pulse 96, temperature 97.7 F (36.5 C), temperature source Oral, resp. rate (!) 24, height 5\' 3"  (1.6 m), weight 72.4 kg, SpO2 98%. CVP:  [1 mmHg-7 mmHg] 4 mmHg      Intake/Output Summary (Last 24 hours) at 12/18/2023 0718 Last data filed at 12/18/2023 0700 Gross per 24 hour  Intake 2333.5 ml  Output 2251 ml  Net 82.5 ml   Filed Weights   12/16/23 0500 12/17/23 0436 12/18/23 0634  Weight: 78.5 kg 73.2 kg 72.4 kg   Examination:  No distress Strength okay Sternotomy looks good Ext warm Moves to command Depressed Lungs sound clear  Coox 55 levo 11, milrinone 0.125 Plts remain low, wbc drifting up again No new imaging On meropenem, linezolid, afebrile  Resolved Hospital Problem list :   Postoperative vent management Postop tamponade resolved after relook and washout on day 1 Assessment & Plan:   Sev Mitral regurg s/p prior mitral clip, now s/p MVR Mod tricuspid regurg s/p TV repair Shock, multifactorial with cardiac and distributive features  pHTN AoC HFrEF NICM pAF, flutter   P - levo for MAP 65 - midodrine  - milrinone titration per AHF - CRRT as below  -presumed sepsis as below   Sepsis unclear source  P -cont mero linezolid -follow fever curve white count -remove  unneccessary lines as able  Afib  P -Amio gtt -warfarin   Acute renal failure  P -CRRT per nephro -goal is even, low filling pressures  Hyperbilirubinemia  -probably congestion related; [place] was assuring and LFTs are ok  P -follow labs PRN   Protein calorie malnutrition Physical deconditioning  Deep tissue injury on left buttocks  approximately 2 cm in diameter purple discoloration P -local wound care -mobility -FMS  -poor appetite, cont EN   Situational depression Sleep disturbance P - SSRI started 1/17 - continue melatonin and seroquel at night  - delirium precautions  - PT/OT   Best Practice (right click and "Reselect all SmartList Selections" daily)   Diet/type: Encourage PO but her appetite has been poor and she is grossly reliant on EN still  DVT prophylaxis systemic heparin --> warfarin  Pressure ulcer(s): L buttocks  GI prophylaxis: N/A Lines: HD cath  Foley:  N/A Code Status:  full code Last date of multidisciplinary goals of care discussion [per primary]  31 min cc time 12/18/2023, 7:18 AM

## 2023-12-18 NOTE — Progress Notes (Signed)
Patient ID: Vanessa [name], female   DOB: Apr 08, 1951, 73 y.o.   MRN: [id number]      Advanced Heart Failure Rounding Note  Cardiologist: Dr. Shirlee Latch  Chief Complaint: Anxious /Cardiogenic Shock  Subjective:   S/P MVR (tissue) and TV ring and same day return to OR for tamponade/bleeding. 1/2: Progressive volume overload with poor UOP, CVVH started but ended up re-intubated.   Extubated 12/05/23 1/12: CVVH stopped 1/13: iHD, HD line removed 1/14: CRRT Restarted.  1/17: RHC mean RA 2, PA 47/20, mean PCWP 5, CI 1.74, PVR 8, PAPi 9.  Milrinone 0.125 this morning.   On CVVH keeping I/Os even, CVP 5.   NE increased to 11 overnight, remains on milrinone 0.125.  Co-ox 55%.   She remains in atrial fibrillation rate 100s on amiodarone gtt and heparin gtt.   Still with poor appetite.  No dyspnea.   Objective:   Weight Range: 72.4 kg Body mass index is 28.27 kg/m.   Vital Signs:   Temp:  [97.5 F (36.4 C)-98.2 F (36.8 C)] 97.7 F (36.5 C) (01/18 0006) Pulse Rate:  [45-121] 96 (01/18 0645) Resp:  [12-47] 24 (01/18 0645) BP: (67-127)/(40-115) 86/63 (01/18 0645) SpO2:  [73 %-100 %] 98 % (01/18 0645) Weight:  [72.4 kg] 72.4 kg (01/18 0634) Last BM Date : 12/17/23  Weight change: Filed Weights   12/16/23 0500 12/17/23 0436 12/18/23 0634  Weight: 78.5 kg 73.2 kg 72.4 kg   Intake/Output:   Intake/Output Summary (Last 24 hours) at 12/18/2023 0737 Last data filed at 12/18/2023 0700 Gross per 24 hour  Intake 2333.5 ml  Output 2251 ml  Net 82.5 ml   CVP 5  Physical Exam  General: NAD Neck: No JVD, no thyromegaly or thyroid nodule.  Lungs: Clear to auscultation bilaterally with normal respiratory effort. CV: Nondisplaced PMI.  Heart irregular S1/S2, no S3/S4, no murmur.  No peripheral edema.  Abdomen: Soft, nontender, no hepatosplenomegaly, no distention.  Skin: Intact without lesions or rashes.  Neurologic: Alert and oriented x 3.  Psych: Flat affect. Extremities: No clubbing or  cyanosis.  HEENT: Normal.   Telemetry   AF 90s-100s (personally reviewed)  Labs    CBC Recent Labs    12/17/23 0419 12/17/23 1009 12/18/23 0419  WBC 16.0*  --  18.7*  HGB 10.6* 12.6  12.2 10.3*  HCT 34.9* 37.0  36.0 33.1*  MCV 100.6*  --  103.1*  PLT 95*  --  93*   Basic Metabolic Panel Recent Labs    56/21/30 0419 12/17/23 1009 12/17/23 1625 12/18/23 0419  NA 134*   < > 133* 131*  K 4.8   < > 5.2* 5.0  CL 98  --  97* 97*  CO2 24  --  23 24  GLUCOSE 152*  --  214* 175*  BUN 21  --  22 23  CREATININE 1.14*  --  1.34* 1.20*  CALCIUM 9.1  --  8.5* 8.5*  MG 2.7*  --   --  2.8*  PHOS 2.8  --  3.3 3.2   < > = values in this interval not displayed.   Liver Function Tests Recent Labs    12/16/23 0417 12/16/23 1601 12/17/23 1625 12/18/23 0419  AST 35  --   --   --   ALT 45*  --   --   --   ALKPHOS 64  --   --   --   BILITOT 7.4*  --   --   --  PROT 7.0  --   --   --   ALBUMIN 2.6*   < > 2.8* 2.8*   < > = values in this interval not displayed.   No results for input(s): "LIPASE", "AMYLASE" in the last 72 hours. Cardiac Enzymes No results for input(s): "CKTOTAL", "CKMB", "CKMBINDEX", "TROPONINI" in the last 72 hours.  BNP: BNP (last 3 results) Recent Labs    05/26/23 1610 08/26/23 1103 09/22/23 0905  BNP 778.0* 781.0* 926.0*   Other results:  Imaging   CARDIAC CATHETERIZATION Result Date: 12/17/2023 1. Low filling pressures. 2. Mild pulmonary artery hypertension 3. Low cardiac output, CI 1.74 by Fick. 4. Preserved PAPi I will restart milrinone at 0.125 mcg/kg/min         Medications:   Scheduled Medications:  ascorbic acid  250 mg Per Tube BID   Chlorhexidine Gluconate Cloth  6 each Topical Daily   darbepoetin (ARANESP) injection - NON-DIALYSIS  40 mcg Subcutaneous Q Tue-1800   feeding supplement (VITAL AF 1.2 CAL)  660 mL Per Tube Q24H   fiber supplement (BANATROL TF)  60 mL Per Tube QID   Gerhardt's butt cream   Topical BID    influenza vaccine adjuvanted  0.5 mL Intramuscular Tomorrow-1000   insulin aspart  0-24 Units Subcutaneous Q4H   linezolid  600 mg Per Tube Q12H   melatonin  3 mg Oral Q24H   midodrine  15 mg Per Tube Q8H   multivitamin  1 tablet Per Tube BID   QUEtiapine  25 mg Oral QHS   sertraline  25 mg Oral Daily   Warfarin - Pharmacist Dosing Inpatient   Does not apply q1600   zinc sulfate (50mg  elemental zinc)  220 mg Per Tube Daily    Infusions:   prismasol BGK 4/2.5 400 mL/hr at 12/17/23 1946    prismasol BGK 4/2.5 400 mL/hr at 12/17/23 1946   sodium chloride 1 mL/hr at 12/17/23 1355   amiodarone 30 mg/hr (12/18/23 0700)   anticoagulant sodium citrate     heparin 950 Units/hr (12/18/23 0700)   meropenem (MERREM) IV Stopped (12/18/23 0655)   milrinone 0.125 mcg/kg/min (12/18/23 0700)   norepinephrine (LEVOPHED) Adult infusion 11 mcg/min (12/18/23 0700)   prismasol BGK 4/2.5 1,500 mL/hr at 12/18/23 0732    PRN Medications: sodium chloride, alteplase, anticoagulant sodium citrate, artificial tears, bisacodyl **OR** bisacodyl, heparin, heparin, HYDROmorphone (DILAUDID) injection, levalbuterol, lidocaine (PF), lidocaine-prilocaine, ondansetron (ZOFRAN) IV, mouth rinse, oxyCODONE, pentafluoroprop-tetrafluoroeth, pneumococcal 20-valent conjugate vaccine, sodium chloride, traZODone  Patient Profile   Vanessa [name] is a 73 y.o. with history of nonischemic cardiomyopathy and severe MR s/p Mitral Clip x2 (2020), pHTN, and CKD IIIb.   Assessment/Plan   1. Post Cardiotomy Cardiogenic & septic shock: Bioprosthetic MVR and tricuspid ring 11/29/23 with Dr. Leafy Ro. Pre-op cath with no significant CAD.  Pre-op echo with EF 40-45%, severe MR. Went back to the OR with post-op tamponade for clean-out.  Echo 12/31 showed EF 25-30%, moderate RV dysfunction, s/p bioprosthetic mitral valve replacement with trivial MR and tricuspid valve repair with trivial TR. Worsened on 1/2 and re-intubated in setting of  volume overload, oliguric renal failure &  pulmonary hypertension. Extubated on 12/04/22. 1/13 CT C/A/P  moderate hemorrhagic pericardial effusion and 5.3 cm hematoma right heart border.  Repeat echo no tamponade. Required CRRT for volume removal.  CVVH stopped 1/12 then restarted 01/14 for volume removal. RHC 1/17 showed low filling pressures and mild pulmonary arterial hypertension but CI 1.74.  Milrinone 0.125 restarted.  Currently on milrinone 0.125 and NE 11. Co-ox 55%, CVP 5.   - Need to get off NE, wean today.   - Continue midodrine 15 tid.  - Continue milrinone 0.125.  - Running CVVH even with weight down and CVP 5.  -2. Mitral regurgitation:  TEE in 11/19 with severe MR, restricted posterior leaflet.  She had Mitraclip x 2 in 1/20. Post-op echo showed mild MR and mild mitral stenosis.   Echo in 9/24 showed MV s/p Mitraclip x 2 with mean gradient 6 mmHg and severe mitral regurgitation. TEE was then done to evaluate, showing 2 Mitraclips in place with mean gradient 4 mmHg and severe eccentric MR from between the 2 clips with ERO 0.54 cm^2.  She was not thought to be a candidate for an additional Mitraclip due to lack of room between the two existing clips. R/LHC (11/24) showed moderate mixed pulmonary arterial and pulmonary venous hypertension likely due to mitral valve regurgitation.  S/p MV replacement and Tricuspid repair on 11/29/23 with Dr. Leafy Ro.  - Last echo stable valves.  3. Ventricular ectopy:  - stop amiodarone gtt, start 200 mg bid.  4. Pulmonary hypertension: Most recent pre-op RHC (11/24) with PVR 4.5, with moderate mixed pulmonary arterial and pulmonary venous hypertension likely due to mitral valve regurgitation. Now s/p MVR, PA pressure severely elevated post-op.  Suspect still mixed PAH/PVH with elevated PCWP.  NO was tried at 10 ppm but did not have much effect so now off.  Will hold off on pulmonary vasodilators; small clinical trials demonstrate no improvement and some markers of  harm with PDE5i in PH in the setting of mitral valve disease. RHC 1/17 with mild pulmonary arterial hypertension.  5. 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 TSH was normal in 8/24. Follows with Endocrinology 6. AKI on CKD stage 3:  1/14 CRRT restarted. Currently running even.   - Wean off NE to get her to iHD.  7. Blood loss anemia - Hgb stable.  8. Complete heart block: She had post-op CHB. Resolved. Now in AF.  9. Hypoxic / Hypercapnic respiratory failure: Reintubated 01/02.  Extubated 01/05.  - Now stable on 2L [place].  10. Atrial fibrillation: Back in Afib on 01/13.  Good rate control.  - Transition amiodarone to po today.  - On Heparin drip + warfarin. INR 1.2  11. ID: Suspected septic shock component, no definite source. Lines changed out.  - Continue linezolid/meropenem per CCM.  12. Thrombocytopenia: Suspect due to sepsis. Platelets stable 93.  13. Deconditioning - Continue PT.  14. FEN: Getting tube feeds  Out of bed to chair.     CRITICAL CARE Performed by: Marca Ancona  Total critical care time: 40 minutes  Critical care time was exclusive of separately billable procedures and treating other patients.  Critical care was necessary to treat or prevent imminent or life-threatening deterioration.  Critical care was time spent personally by me on the following activities: development of treatment plan with patient and/or surrogate as well as nursing, discussions with consultants, evaluation of patient's response to treatment, examination of patient, obtaining history from patient or surrogate, ordering and performing treatments and interventions, ordering and review of laboratory studies, ordering and review of radiographic studies, pulse oximetry and re-evaluation of patient's condition.  Marca Ancona 12/18/2023 7:37 AM

## 2023-12-18 NOTE — Progress Notes (Signed)
PHARMACY - ANTICOAGULATION CONSULT NOTE  Pharmacy Consult for IV heparin transition to warfarin Indication: atrial fibrillation  No Known Allergies  Patient Measurements: Height: 5\' 3"  (160 cm) Weight: 72.4 kg (159 lb 9.8 oz) IBW/kg (Calculated) : 52.4 Heparin Dosing Weight: ~ 70 kg  Vital Signs: Temp: 97.5 F (36.4 C) (01/18 1128) Temp Source: Axillary (01/18 1128) BP: 83/60 (01/18 1100) Pulse Rate: 76 (01/18 1100)  Labs: Recent Labs    12/16/23 0417 12/16/23 1601 12/17/23 0419 12/17/23 1009 12/17/23 1625 12/18/23 0419  HGB 9.5*  --  10.6* 12.6  12.2  --  10.3*  HCT 31.3*  --  34.9* 37.0  36.0  --  33.1*  PLT 89*  --  95*  --   --  93*  APTT 98* 74* 74*  --   --  61*  LABPROT 14.9  --  14.9  --   --  15.1  INR 1.1  --  1.2  --   --  1.2  HEPARINUNFRC  --  0.46 0.40  --   --  0.37  CREATININE  --  1.22* 1.14*  --  1.34* 1.20*    Estimated Creatinine Clearance: 40.4 mL/min (A) (by C-G formula based on SCr of 1.2 mg/dL (H)).   Assessment: 73 yo female s/p scheduled bioprosthetic mitral valve replacement with tissue valve 12/30 now with afib, pharmacy asked to dose anticoagulation with IV heparin. Spontaneously converted 1/7; flipped back to AF overnight 1/9 0300. She is noted on DRRT.   -Heparin level= 0.37, aPTT= 61. hg= 9.5, plt= 89 -Elevated t bili (7.4 with trend down) may falsely low heparin levels -the heparin level has been stable at goal on 950 units/hr since 12/15/22; aPTT low but I believe the heparin level is more reliable   Goal of Therapy:  Heparin level 0.3-0.5 units/ml aPTT 66-85 sec INR goal 2 - 3 (bMVR) Monitor platelets by anticoagulation protocol: Yes   Plan:  -Continue heparin at 950 units/hr -Warfarin 2.5 mg x1 today (no dose increase today due to patient acuity) -Daily heparin level, aPTT, CBC and INR  Harland German, PharmD Clinical Pharmacist **Pharmacist phone directory can now be found on [email or web address] ([email or web address]).  Listed under [facility]  [facility]arm[facility].

## 2023-12-18 NOTE — Progress Notes (Signed)
Vanessa [name] PROGRESS NOTE  Assessment/ Plan: Pt is a 73 y.o. yo female  with a past medical history HTN, NICM, CHF, pulmonary hypertension, mitral and tricuspid valve disease who present w/ surgery for valvular disease c/b shock, AHRF, and AKI   # Oligo-Anuric AKI on CKD 3A: Multifactorial etiology including cardiorenal syndrome and ischemic ATN due to shock.  Started CRRT on 1/2 for fluid overload.  The CRRT was held on 1/12 because of worsening leukocytosis and poor access with plan to have line holiday. IHD on 1/13 with IDH and no UF. Resumed CRRT on 1/14 to manage volume.  Tolerating well.    #Multifactorial shock: Vasoplegic and cardioplegia catheter surgery.   - midodrine, milrinone, and norepi. Cont mgmt per primary team   #Acute exacerbation of heart failure with reduced ejection fraction: Volume overload. Optimized with CRRT   #Severe MR and tricuspid valvular disease:  - Status post repair.  CT surgery managing   #Postoperative cardiac tamponade:  - Continue monitoring per cardiology   # Hyponatremia: improved with CRRT    #Anemia: Related to blood loss and compounded by AKI and critical illness.    - Transfusions per primary team.   - aranesp 40 mcg weekly on Tuesdays   # Thrombocytopenia: switched from heparin to coumadin. improving   Subjective: no complaints. Rhc yesterday, on milrinone and norepi, running even on crrt   Objective Vital signs in last 24 hours: Vitals:   12/18/23 0715 12/18/23 0730 12/18/23 0745 12/18/23 0800  BP:  (!) 92/54 (!) 101/49 (!) 108/55  Pulse: (!) 101 77 100 91  Resp: 18 (!) 27 (!) 28 19  Temp:      TempSrc:      SpO2: 96% 96% 96% 100%  Weight:      Height:       Weight change: -0.8 kg  Intake/Output Summary (Last 24 hours) at 12/18/2023 0827 Last data filed at 12/18/2023 0800 Gross per 24 hour  Intake 2293.56 ml  Output 2189.9 ml  Net 103.66 ml       Labs: RENAL PANEL Recent Labs  Lab  12/14/23 0240 12/14/23 0752 12/15/23 0251 12/15/23 1608 12/16/23 0415 12/16/23 0417 12/16/23 1601 12/17/23 0419 12/17/23 1009 12/17/23 1625 12/18/23 0419  NA 131*   < > 133*   < > 134*  --  133* 134* 133*  133* 133* 131*  K 4.2   < > 4.3   < > 4.9  --  5.2* 4.8 5.4*  5.4* 5.2* 5.0  CL 92*   < > 101   < > 99  --  99 98  --  97* 97*  CO2 26   < > 23   < > 26  --  24 24  --  23 24  GLUCOSE 124*   < > 164*   < > 157*  --  169* 152*  --  214* 175*  BUN 36*   < > 25*   < > 22  --  22 21  --  22 23  CREATININE 2.50*   < > 1.66*   < > 1.50*  --  1.22* 1.14*  --  1.34* 1.20*  CALCIUM 8.6*   < > 7.7*   < > 8.3*  --  8.9 9.1  --  8.5* 8.5*  MG 2.2  --  2.3  --   --  2.6*  --  2.7*  --   --  2.8*  PHOS 5.3*   < >  3.8   < > 3.3  --  2.8 2.8  --  3.3 3.2  ALBUMIN 2.6*   < > 2.3*   < > 2.6* 2.6* 2.9* 2.9*  --  2.8* 2.8*   < > = values in this interval not displayed.    Liver Function Tests: Recent Labs  Lab 12/12/23 0939 12/13/23 0303 12/14/23 0240 12/16/23 0417 12/16/23 1601 12/17/23 0419 12/17/23 1625 12/18/23 0419  AST 41 39  --  35  --   --   --   --   ALT 44 44  --  45*  --   --   --   --   ALKPHOS 76 66  --  64  --   --   --   --   BILITOT 13.0* 11.9*  --  7.4*  --   --   --   --   PROT 7.5 7.4  --  7.0  --   --   --   --   ALBUMIN 2.9* 2.7*  2.8*   < > 2.6*   < > 2.9* 2.8* 2.8*   < > = values in this interval not displayed.   No results for input(s): "LIPASE", "AMYLASE" in the last 168 hours. No results for input(s): "AMMONIA" in the last 168 hours.  CBC: Recent Labs    12/15/23 0253 12/16/23 0417 12/17/23 0419 12/17/23 1009 12/18/23 0419  HGB 8.9* 9.5* 10.6* 12.6  12.2 10.3*  MCV 99.3 101.6* 100.6*  --  103.1*    Cardiac Enzymes: No results for input(s): "CKTOTAL", "CKMB", "CKMBINDEX", "TROPONINI" in the last 168 hours. CBG: Recent Labs  Lab 12/17/23 1655 12/17/23 1941 12/17/23 2359 12/18/23 0405 12/18/23 0744  GLUCAP 118* 171* 118* 141* 133*     Iron Studies: No results for input(s): "IRON", "TIBC", "TRANSFERRIN", "FERRITIN" in the last 72 hours. Studies/Results: CARDIAC CATHETERIZATION Result Date: 12/17/2023 1. Low filling pressures. 2. Mild pulmonary artery hypertension 3. Low cardiac output, CI 1.74 by Fick. 4. Preserved PAPi I will restart milrinone at 0.125 mcg/kg/min   DG CHEST PORT 1 VIEW Result Date: 12/17/2023 CLINICAL DATA:  Follow-up pulmonary edema. EXAM: PORTABLE CHEST 1 VIEW COMPARISON:  12/14/2023 and 12/13/2023. FINDINGS: Moderate enlargement of the cardiac silhouette, unchanged. Right internal jugular central venous line and enteric tube unchanged. Left lung base opacity partly obscures hemidiaphragm, mildly improved from the previous exam. Remainder of the lungs is clear. No pneumothorax. IMPRESSION: 1. Mild improvement in left lung base opacity from the most recent prior exam. No other change. No new lung abnormalities. 2. Stable changes from cardiac surgery and valve replacement. 3. Stable remaining support apparatus. Electronically Signed   By: Amie Portland M.D.   On: 12/17/2023 09:46     Medications: Infusions:   prismasol BGK 4/2.5 400 mL/hr at 12/17/23 1946    prismasol BGK 4/2.5 400 mL/hr at 12/17/23 1946   sodium chloride 1 mL/hr at 12/17/23 1355   anticoagulant sodium citrate     heparin 950 Units/hr (12/18/23 0800)   meropenem (MERREM) IV Stopped (12/18/23 0623)   milrinone 0.125 mcg/kg/min (12/18/23 0800)   norepinephrine (LEVOPHED) Adult infusion 11 mcg/min (12/18/23 0800)   prismasol BGK 4/2.5 1,500 mL/hr at 12/18/23 0732    Scheduled Medications:  amiodarone  200 mg Oral BID   ascorbic acid  250 mg Per Tube BID   Chlorhexidine Gluconate Cloth  6 each Topical Daily   darbepoetin (ARANESP) injection - NON-DIALYSIS  40 mcg Subcutaneous Q Tue-1800  feeding supplement (VITAL AF 1.2 CAL)  660 mL Per Tube Q24H   fiber supplement (BANATROL TF)  60 mL Per Tube QID   Gerhardt's butt cream    Topical BID   influenza vaccine adjuvanted  0.5 mL Intramuscular Tomorrow-1000   insulin aspart  0-24 Units Subcutaneous Q4H   linezolid  600 mg Per Tube Q12H   melatonin  3 mg Oral Q24H   midodrine  15 mg Per Tube Q8H   multivitamin  1 tablet Per Tube BID   QUEtiapine  25 mg Oral QHS   sertraline  25 mg Oral Daily   Warfarin - Pharmacist Dosing Inpatient   Does not apply q1600   zinc sulfate (50mg  elemental zinc)  220 mg Per Tube Daily    have reviewed scheduled and prn medications.  Physical Exam: General:NAD, comfortable Heart:normal rate, no rub Lungs: Reduced breath sound bilateral. Bilateral chest rise, no iwob Abdomen:soft, Non-tender, non-distended Extremities: Peripheral  edema trace, cool lower extremities  Dialysis Access: .  Right IJ temporary HD catheter was replaced by PCCM on 1/14. Bernestine Amass Alquan Morrish 12/18/2023,8:27 AM  LOS: 19 days

## 2023-12-18 NOTE — Procedures (Signed)
I saw and evaluated the patient on CRRT.  I reviewed the last 24 hours events.  Adjustments to CRRT prescription are made as needed.  Keeping even   Filed Weights   12/16/23 0500 12/17/23 0436 12/18/23 0634  Weight: 78.5 kg 73.2 kg 72.4 kg    Recent Labs  Lab 12/18/23 0419  NA 131*  K 5.0  CL 97*  CO2 24  GLUCOSE 175*  BUN 23  CREATININE 1.20*  CALCIUM 8.5*  PHOS 3.2    Recent Labs  Lab 12/16/23 0417 12/17/23 0419 12/17/23 1009 12/18/23 0419  WBC 15.5* 16.0*  --  18.7*  HGB 9.5* 10.6* 12.6  12.2 10.3*  HCT 31.3* 34.9* 37.0  36.0 33.1*  MCV 101.6* 100.6*  --  103.1*  PLT 89* 95*  --  93*    Scheduled Meds:  amiodarone  200 mg Oral BID   ascorbic acid  250 mg Per Tube BID   Chlorhexidine Gluconate Cloth  6 each Topical Daily   darbepoetin (ARANESP) injection - NON-DIALYSIS  40 mcg Subcutaneous Q Tue-1800   feeding supplement (VITAL AF 1.2 CAL)  660 mL Per Tube Q24H   fiber supplement (BANATROL TF)  60 mL Per Tube QID   Gerhardt's butt cream   Topical BID   influenza vaccine adjuvanted  0.5 mL Intramuscular Tomorrow-1000   insulin aspart  0-24 Units Subcutaneous Q4H   linezolid  600 mg Per Tube Q12H   melatonin  3 mg Oral Q24H   midodrine  15 mg Per Tube Q8H   multivitamin  1 tablet Per Tube BID   QUEtiapine  25 mg Oral QHS   sertraline  25 mg Oral Daily   Warfarin - Pharmacist Dosing Inpatient   Does not apply q1600   zinc sulfate (50mg  elemental zinc)  220 mg Per Tube Daily   Continuous Infusions:   prismasol BGK 4/2.5 400 mL/hr at 12/17/23 1946    prismasol BGK 4/2.5 400 mL/hr at 12/17/23 1946   sodium chloride 1 mL/hr at 12/17/23 1355   anticoagulant sodium citrate     heparin 950 Units/hr (12/18/23 0800)   meropenem (MERREM) IV Stopped (12/18/23 0655)   milrinone 0.125 mcg/kg/min (12/18/23 0800)   norepinephrine (LEVOPHED) Adult infusion 11 mcg/min (12/18/23 0800)   prismasol BGK 4/2.5 1,500 mL/hr at 12/18/23 0732   PRN Meds:.sodium chloride,  alteplase, anticoagulant sodium citrate, artificial tears, bisacodyl **OR** bisacodyl, heparin, heparin, HYDROmorphone (DILAUDID) injection, levalbuterol, lidocaine (PF), lidocaine-prilocaine, ondansetron (ZOFRAN) IV, mouth rinse, oxyCODONE, pentafluoroprop-tetrafluoroeth, pneumococcal 20-valent conjugate vaccine, sodium chloride, traZODone   Louie Bun,  MD 12/18/2023, 8:26 AM

## 2023-12-18 NOTE — Plan of Care (Signed)
  Problem: Education: Goal: Knowledge of General Education information will improve Description: Including pain rating scale, medication(s)/side effects and non-pharmacologic comfort measures Outcome: Progressing   Problem: Health Behavior/Discharge Planning: Goal: Ability to manage health-related needs will improve Outcome: Progressing   Problem: Clinical Measurements: Goal: Ability to maintain clinical measurements within normal limits will improve Outcome: Progressing Goal: Will remain free from infection Outcome: Progressing Goal: Diagnostic test results will improve Outcome: Progressing Goal: Respiratory complications will improve Outcome: Progressing Goal: Cardiovascular complication will be avoided Outcome: Progressing   Problem: Nutrition: Goal: Adequate nutrition will be maintained Outcome: Progressing   Problem: Coping: Goal: Level of anxiety will decrease Outcome: Progressing   Problem: Elimination: Goal: Will not experience complications related to bowel motility Outcome: Progressing   Problem: Pain Management: Goal: General experience of comfort will improve Outcome: Progressing   Problem: Safety: Goal: Ability to remain free from injury will improve Outcome: Progressing   Problem: Skin Integrity: Goal: Risk for impaired skin integrity will decrease Outcome: Progressing   Problem: Education: Goal: Will demonstrate proper wound care and an understanding of methods to prevent future damage Outcome: Progressing Goal: Knowledge of disease or condition will improve Outcome: Progressing Goal: Knowledge of the prescribed therapeutic regimen will improve Outcome: Progressing Goal: Individualized Educational Video(s) Outcome: Progressing   Problem: Activity: Goal: Risk for activity intolerance will decrease Outcome: Progressing   Problem: Cardiac: Goal: Will achieve and/or maintain hemodynamic stability Outcome: Progressing   Problem: Clinical  Measurements: Goal: Postoperative complications will be avoided or minimized Outcome: Progressing   Problem: Respiratory: Goal: Respiratory status will improve Outcome: Progressing   Problem: Skin Integrity: Goal: Wound healing without signs and symptoms of infection Outcome: Progressing Goal: Risk for impaired skin integrity will decrease Outcome: Progressing   Problem: Urinary Elimination: Goal: Ability to achieve and maintain adequate renal perfusion and functioning will improve Outcome: Progressing   Problem: Education: Goal: Will demonstrate proper wound care and an understanding of methods to prevent future damage Outcome: Progressing Goal: Knowledge of disease or condition will improve Outcome: Progressing Goal: Knowledge of the prescribed therapeutic regimen will improve Outcome: Progressing Goal: Individualized Educational Video(s) Outcome: Progressing   Problem: Activity: Goal: Risk for activity intolerance will decrease Outcome: Progressing   Problem: Cardiac: Goal: Will achieve and/or maintain hemodynamic stability Outcome: Progressing   Problem: Clinical Measurements: Goal: Postoperative complications will be avoided or minimized Outcome: Progressing   Problem: Respiratory: Goal: Respiratory status will improve Outcome: Progressing   Problem: Skin Integrity: Goal: Wound healing without signs and symptoms of infection Outcome: Progressing Goal: Risk for impaired skin integrity will decrease Outcome: Progressing   Problem: Urinary Elimination: Goal: Ability to achieve and maintain adequate renal perfusion and functioning will improve Outcome: Progressing   Problem: Education: Goal: Understanding of CV disease, CV risk reduction, and recovery process will improve Outcome: Progressing Goal: Individualized Educational Video(s) Outcome: Progressing   Problem: Activity: Goal: Ability to return to baseline activity level will improve Outcome:  Progressing   Problem: Cardiovascular: Goal: Ability to achieve and maintain adequate cardiovascular perfusion will improve Outcome: Progressing Goal: Vascular access site(s) Level 0-1 will be maintained Outcome: Progressing   Problem: Health Behavior/Discharge Planning: Goal: Ability to safely manage health-related needs after discharge will improve Outcome: Progressing   Problem: Activity: Goal: Risk for activity intolerance will decrease Outcome: Not Progressing

## 2023-12-18 NOTE — Plan of Care (Signed)
  Problem: Coping: Goal: Level of anxiety will decrease Outcome: Progressing   Problem: Elimination: Goal: Will not experience complications related to bowel motility Outcome: Progressing   Problem: Pain Management: Goal: General experience of comfort will improve Outcome: Progressing

## 2023-12-19 DIAGNOSIS — I340 Nonrheumatic mitral (valve) insufficiency: Secondary | ICD-10-CM | POA: Diagnosis not present

## 2023-12-19 DIAGNOSIS — Z952 Presence of prosthetic heart valve: Secondary | ICD-10-CM | POA: Diagnosis not present

## 2023-12-19 DIAGNOSIS — R570 Cardiogenic shock: Secondary | ICD-10-CM | POA: Diagnosis not present

## 2023-12-19 DIAGNOSIS — I272 Pulmonary hypertension, unspecified: Secondary | ICD-10-CM | POA: Diagnosis not present

## 2023-12-19 LAB — GLUCOSE, CAPILLARY
Glucose-Capillary: 160 mg/dL — ABNORMAL HIGH (ref 70–99)
Glucose-Capillary: 175 mg/dL — ABNORMAL HIGH (ref 70–99)
Glucose-Capillary: 106 mg/dL — ABNORMAL HIGH (ref 70–99)
Glucose-Capillary: 120 mg/dL — ABNORMAL HIGH (ref 70–99)
Glucose-Capillary: 134 mg/dL — ABNORMAL HIGH (ref 70–99)
Glucose-Capillary: 114 mg/dL — ABNORMAL HIGH (ref 70–99)

## 2023-12-19 LAB — PROTIME-INR
INR: 1.2 (ref 0.8–1.2)
Prothrombin Time: 15.6 s — ABNORMAL HIGH (ref 11.4–15.2)

## 2023-12-19 LAB — MAGNESIUM: Magnesium: 2.7 mg/dL — ABNORMAL HIGH (ref 1.7–2.4)

## 2023-12-19 LAB — COOXEMETRY PANEL
Carboxyhemoglobin: 1.9 % — ABNORMAL HIGH (ref 0.5–1.5)
Carboxyhemoglobin: 1.9 % — ABNORMAL HIGH (ref 0.5–1.5)
Methemoglobin: <0.7 % (ref 0.0–1.5)
Methemoglobin: 1 % (ref 0.0–1.5)
O2 Saturation: 50.1 %
O2 Saturation: 64.2 %
Total hemoglobin: 9.2 g/dL — ABNORMAL LOW (ref 12.0–16.0)
Total hemoglobin: 9.9 g/dL — ABNORMAL LOW (ref 12.0–16.0)

## 2023-12-19 LAB — RENAL FUNCTION PANEL
Albumin: 2.6 g/dL — ABNORMAL LOW (ref 3.5–5.0)
Albumin: 2.5 g/dL — ABNORMAL LOW (ref 3.5–5.0)
Anion gap: 9 (ref 5–15)
Anion gap: 10 (ref 5–15)
BUN: 19 mg/dL (ref 8–23)
BUN: 15 mg/dL (ref 8–23)
CO2: 25 mmol/L (ref 22–32)
CO2: 23 mmol/L (ref 22–32)
Calcium: 8.4 mg/dL — ABNORMAL LOW (ref 8.9–10.3)
Calcium: 8.1 mg/dL — ABNORMAL LOW (ref 8.9–10.3)
Chloride: 98 mmol/L (ref 98–111)
Chloride: 100 mmol/L (ref 98–111)
Creatinine, Ser: 1.1 mg/dL — ABNORMAL HIGH (ref 0.44–1.00)
Creatinine, Ser: 1.15 mg/dL — ABNORMAL HIGH (ref 0.44–1.00)
GFR, Estimated: 53 mL/min — ABNORMAL LOW (ref >60)
GFR, Estimated: 51 mL/min — ABNORMAL LOW (ref >60)
Glucose, Bld: 173 mg/dL — ABNORMAL HIGH (ref 70–99)
Glucose, Bld: 157 mg/dL — ABNORMAL HIGH (ref 70–99)
Phosphorus: 3 mg/dL (ref 2.5–4.6)
Phosphorus: 2.4 mg/dL — ABNORMAL LOW (ref 2.5–4.6)
Potassium: 4.7 mmol/L (ref 3.5–5.1)
Potassium: 4.5 mmol/L (ref 3.5–5.1)
Sodium: 132 mmol/L — ABNORMAL LOW (ref 135–145)
Sodium: 133 mmol/L — ABNORMAL LOW (ref 135–145)

## 2023-12-19 LAB — CBC
HCT: 30.5 % — ABNORMAL LOW (ref 36.0–46.0)
Hemoglobin: 9.4 g/dL — ABNORMAL LOW (ref 12.0–15.0)
MCH: 31.8 pg (ref 26.0–34.0)
MCHC: 30.8 g/dL (ref 30.0–36.0)
MCV: 103 fL — ABNORMAL HIGH (ref 80.0–100.0)
Platelets: 88 10*3/uL — ABNORMAL LOW (ref 150–400)
RBC: 2.96 MIL/uL — ABNORMAL LOW (ref 3.87–5.11)
RDW: 23.8 % — ABNORMAL HIGH (ref 11.5–15.5)
WBC: 19.4 10*3/uL — ABNORMAL HIGH (ref 4.0–10.5)
nRBC: 4.1 % — ABNORMAL HIGH (ref 0.0–0.2)

## 2023-12-19 LAB — HEPARIN LEVEL (UNFRACTIONATED): Heparin Unfractionated: 0.39 [IU]/mL (ref 0.30–0.70)

## 2023-12-19 LAB — APTT: aPTT: 74 s — ABNORMAL HIGH (ref 24–36)

## 2023-12-19 MED ORDER — SODIUM CHLORIDE 0.9 % IV SOLN
2.0000 g | Freq: Two times a day (BID) | INTRAVENOUS | Status: AC
  Administered 2023-12-19 – 2023-12-26 (×14): 2 g via INTRAVENOUS
  Filled 2023-12-19 (×15): qty 40

## 2023-12-19 MED ORDER — WARFARIN SODIUM 5 MG PO TABS
5.0000 mg | ORAL_TABLET | Freq: Once | ORAL | Status: AC
Start: 2023-12-19 — End: 2023-12-19
  Administered 2023-12-19: 5 mg via ORAL
  Filled 2023-12-19: qty 1

## 2023-12-19 MED ORDER — SODIUM CHLORIDE 0.9 % IV SOLN: 1.0000 g | Freq: Two times a day (BID) | INTRAVENOUS | Status: DC

## 2023-12-19 NOTE — Procedures (Signed)
I saw and evaluated the patient on CRRT.  I reviewed the last 24 hours events.  Adjustments to CRRT prescription are made as needed.  Keeping even   Filed Weights   12/17/23 0436 12/18/23 0634 12/19/23 0645  Weight: 73.2 kg 72.4 kg 73.3 kg    Recent Labs  Lab 12/19/23 0448  NA 132*  K 4.7  CL 98  CO2 25  GLUCOSE 173*  BUN 19  CREATININE 1.10*  CALCIUM 8.4*  PHOS 3.0    Recent Labs  Lab 12/17/23 0419 12/17/23 1009 12/18/23 0419 12/19/23 0448  WBC 16.0*  --  18.7* 19.4*  HGB 10.6* 12.6  12.2 10.3* 9.4*  HCT 34.9* 37.0  36.0 33.1* 30.5*  MCV 100.6*  --  103.1* 103.0*  PLT 95*  --  93* 88*    Scheduled Meds:  amiodarone  200 mg Oral BID   ascorbic acid  250 mg Per Tube BID   Chlorhexidine Gluconate Cloth  6 each Topical Daily   darbepoetin (ARANESP) injection - NON-DIALYSIS  40 mcg Subcutaneous Q Tue-1800   feeding supplement (VITAL AF 1.2 CAL)  660 mL Per Tube Q24H   fiber supplement (BANATROL TF)  60 mL Per Tube QID   Gerhardt's butt cream   Topical BID   influenza vaccine adjuvanted  0.5 mL Intramuscular Tomorrow-1000   insulin aspart  0-24 Units Subcutaneous Q4H   melatonin  3 mg Oral Q24H   midodrine  15 mg Per Tube Q8H   multivitamin  1 tablet Per Tube BID   QUEtiapine  25 mg Oral QHS   sertraline  25 mg Oral Daily   Warfarin - Pharmacist Dosing Inpatient   Does not apply q1600   zinc sulfate (50mg  elemental zinc)  220 mg Per Tube Daily   Continuous Infusions:   prismasol BGK 4/2.5 400 mL/hr at 12/19/23 0655    prismasol BGK 4/2.5 400 mL/hr at 12/19/23 0655   sodium chloride 10 mL/hr at 12/18/23 1416   anticoagulant sodium citrate     heparin 950 Units/hr (12/19/23 0700)   meropenem (MERREM) IV Stopped (12/19/23 0552)   milrinone 0.125 mcg/kg/min (12/19/23 0700)   norepinephrine (LEVOPHED) Adult infusion 12 mcg/min (12/19/23 0700)   prismasol BGK 4/2.5 1,500 mL/hr at 12/19/23 0450   PRN Meds:.sodium chloride, alteplase, anticoagulant sodium  citrate, artificial tears, bisacodyl **OR** bisacodyl, heparin, heparin, HYDROmorphone (DILAUDID) injection, levalbuterol, lidocaine (PF), lidocaine-prilocaine, ondansetron (ZOFRAN) IV, mouth rinse, oxyCODONE, pentafluoroprop-tetrafluoroeth, pneumococcal 20-valent conjugate vaccine, sodium chloride, traZODone   Louie Bun,  MD 12/19/2023, 7:40 AM

## 2023-12-19 NOTE — Progress Notes (Addendum)
Patient ID: Vanessa [name], female   DOB: 10-Nov-1951, 73 y.o.   MRN: [id number]      Advanced Heart Failure Rounding Note  Cardiologist: Dr. Shirlee [name]  Chief Complaint: Anxious /Cardiogenic Shock  Subjective:   S/P MVR (tissue) and TV ring and same day return to OR for tamponade/bleeding. 1/2: Progressive volume overload with poor UOP, CVVH started but ended up re-intubated.   Extubated 12/05/23 1/12: CVVH stopped 1/13: iHD, HD line removed 1/14: CRRT Restarted.  1/17: RHC mean RA 2, PA 47/20, mean PCWP 5, CI 1.74, PVR 8, PAPi 9.  Milrinone 0.125 started.   On CVVH keeping I/Os even, CVP < 5.   NE increased to 12 overnight but now back down to 10, remains on milrinone 0.125.  Co-ox 64%.   She went back into NSR, on po amiodarone and heparin gtt.   WBCs trending up again at 19, no cough/dysuria, etc.  She remains on meropenem, off linezolid.   No dyspnea or lightheadedness.  Sat on side of bed yesterday.   Objective:   Weight Range: 73.3 kg Body mass index is 28.63 kg/m.   Vital Signs:   Temp:  [97.4 F (36.3 C)-98.6 F (37 C)] 97.4 F (36.3 C) (01/19 0729) Pulse Rate:  [67-155] 91 (01/19 0745) Resp:  [13-30] 14 (01/19 0745) BP: (67-171)/(37-148) 91/54 (01/19 0745) SpO2:  [79 %-99 %] 98 % (01/19 0745) Weight:  [73.3 kg] 73.3 kg (01/19 0645) Last BM Date : 12/18/23  Weight change: Filed Weights   12/17/23 0436 12/18/23 0634 12/19/23 0645  Weight: 73.2 kg 72.4 kg 73.3 kg   Intake/Output:   Intake/Output Summary (Last 24 hours) at 12/19/2023 0808 Last data filed at 12/19/2023 0700 Gross per 24 hour  Intake 2282.11 ml  Output 1871.1 ml  Net 411.01 ml   CVP <5 Physical Exam  General: NAD Neck: No JVD, no thyromegaly or thyroid nodule.  Lungs: Clear to auscultation bilaterally with normal respiratory effort. CV: Nondisplaced PMI.  Heart regular S1/S2, no S3/S4, no murmur.  No peripheral edema.  Abdomen: Soft, nontender, no hepatosplenomegaly, no distention.  Skin:  Intact without lesions or rashes.  Neurologic: Alert and oriented x 3.  Psych: Normal affect. Extremities: No clubbing or cyanosis.  HEENT: Normal.   Telemetry   NSR 80s (personally reviewed)  Labs    CBC Recent Labs    12/18/23 0419 12/19/23 0448  WBC 18.7* 19.4*  HGB 10.3* 9.4*  HCT 33.1* 30.5*  MCV 103.1* 103.0*  PLT 93* 88*   Basic Metabolic Panel Recent Labs    32/44/01 0419 12/18/23 1502 12/19/23 0448  NA 131* 133* 132*  K 5.0 4.9 4.7  CL 97* 99 98  CO2 24 25 25   GLUCOSE 175* 137* 173*  BUN 23 19 19   CREATININE 1.20* 1.27* 1.10*  CALCIUM 8.5* 8.4* 8.4*  MG 2.8*  --  2.7*  PHOS 3.2 2.7 3.0   Liver Function Tests Recent Labs    12/18/23 1502 12/19/23 0448  ALBUMIN 2.7* 2.6*   No results for input(s): "LIPASE", "AMYLASE" in the last 72 hours. Cardiac Enzymes No results for input(s): "CKTOTAL", "CKMB", "CKMBINDEX", "TROPONINI" in the last 72 hours.  BNP: BNP (last 3 results) Recent Labs    05/26/23 1610 08/26/23 1103 09/22/23 0905  BNP 778.0* 781.0* 926.0*   Other results:  Imaging   No results found.        Medications:   Scheduled Medications:  amiodarone  200 mg Oral BID   ascorbic acid  250 mg Per Tube BID   Chlorhexidine Gluconate Cloth  6 each Topical Daily   darbepoetin (ARANESP) injection - NON-DIALYSIS  40 mcg Subcutaneous Q Tue-1800   feeding supplement (VITAL AF 1.2 CAL)  660 mL Per Tube Q24H   fiber supplement (BANATROL TF)  60 mL Per Tube QID   Gerhardt's butt cream   Topical BID   influenza vaccine adjuvanted  0.5 mL Intramuscular Tomorrow-1000   insulin aspart  0-24 Units Subcutaneous Q4H   melatonin  3 mg Oral Q24H   midodrine  15 mg Per Tube Q8H   multivitamin  1 tablet Per Tube BID   QUEtiapine  25 mg Oral QHS   sertraline  25 mg Oral Daily   Warfarin - Pharmacist Dosing Inpatient   Does not apply q1600   zinc sulfate (50mg  elemental zinc)  220 mg Per Tube Daily    Infusions:   prismasol BGK 4/2.5 400  mL/hr at 12/19/23 0655    prismasol BGK 4/2.5 400 mL/hr at 12/19/23 0655   sodium chloride 10 mL/hr at 12/18/23 1416   anticoagulant sodium citrate     heparin 950 Units/hr (12/19/23 0700)   meropenem (MERREM) IV Stopped (12/19/23 0552)   milrinone 0.125 mcg/kg/min (12/19/23 0700)   norepinephrine (LEVOPHED) Adult infusion 12 mcg/min (12/19/23 0700)   prismasol BGK 4/2.5 1,500 mL/hr at 12/19/23 0450    PRN Medications: sodium chloride, alteplase, anticoagulant sodium citrate, artificial tears, bisacodyl **OR** bisacodyl, heparin, heparin, HYDROmorphone (DILAUDID) injection, levalbuterol, lidocaine (PF), lidocaine-prilocaine, ondansetron (ZOFRAN) IV, mouth rinse, oxyCODONE, pentafluoroprop-tetrafluoroeth, pneumococcal 20-valent conjugate vaccine, sodium chloride, traZODone  Patient Profile   Vanessa [name] is a 73 y.o. with history of nonischemic cardiomyopathy and severe MR s/p Mitral Clip x2 (2020), pHTN, and CKD IIIb.   Assessment/Plan   1. Post Cardiotomy Cardiogenic & septic shock: Bioprosthetic MVR and tricuspid ring 11/29/23 with Dr. Leafy [name]. Pre-op cath with no significant CAD.  Pre-op echo with EF 40-45%, severe MR. Went back to the OR with post-op tamponade for clean-out.  Echo 12/31 showed EF 25-30%, moderate RV dysfunction, s/p bioprosthetic mitral valve replacement with trivial MR and tricuspid valve repair with trivial TR. Worsened on 1/2 and re-intubated in setting of volume overload, oliguric renal failure &  pulmonary hypertension. Extubated on 12/04/22. 1/13 CT C/A/P  moderate hemorrhagic pericardial effusion and 5.3 cm hematoma right heart border.  Repeat echo no tamponade. Required CRRT for volume removal.  CVVH stopped 1/12 then restarted 01/14 for volume removal. RHC 1/17 showed low filling pressures and mild pulmonary arterial hypertension but CI 1.74.  Milrinone 0.125 restarted.  Currently on milrinone 0.125 and NE 10. Co-ox 64%, CVP < 5.   - Need to get off NE, wean  today. Discussed keeping SBP > 90 or MAP > 65.   - Continue midodrine 15 tid.  - Continue milrinone 0.125, resend co-ox now (would like to avoid increasing).  - Running CVVH even with weight down and CVP < 5.  2. Mitral regurgitation:  TEE in 11/19 with severe MR, restricted posterior leaflet.  She had Mitraclip x 2 in 1/20. Post-op echo showed mild MR and mild mitral stenosis.   Echo in 9/24 showed MV s/p Mitraclip x 2 with mean gradient 6 mmHg and severe mitral regurgitation. TEE was then done to evaluate, showing 2 Mitraclips in place with mean gradient 4 mmHg and severe eccentric MR from between the 2 clips with ERO 0.54 cm^2.  She was not thought to be a candidate for an  additional Mitraclip due to lack of room between the two existing clips. R/LHC (11/24) showed moderate mixed pulmonary arterial and pulmonary venous hypertension likely due to mitral valve regurgitation.  S/p MV replacement and Tricuspid repair on 11/29/23 with Dr. Leafy [name].  - Last echo stable valves.  3. Ventricular ectopy:  - Continue amiodarone 200 mg bid.  4. Pulmonary hypertension: Most recent pre-op RHC (11/24) with PVR 4.5, with moderate mixed pulmonary arterial and pulmonary venous hypertension likely due to mitral valve regurgitation. Now s/p MVR, PA pressure severely elevated post-op.  Suspect still mixed PAH/PVH with elevated PCWP.  NO was tried at 10 ppm but did not have much effect so now off.  Will hold off on pulmonary vasodilators; small clinical trials demonstrate no improvement and some markers of harm with PDE5i in PH in the setting of mitral valve disease. RHC 1/17 with mild pulmonary arterial hypertension.  5. 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 TSH was normal in 8/24. Follows with Endocrinology 6. AKI on CKD stage 3:  1/14 CRRT restarted. Currently running even.   - Working to wean off NE to get her to [facility].  7. Blood loss anemia - Hgb stable.  8. Complete heart block: She had post-op CHB.  Resolved.  9. Hypoxic / Hypercapnic respiratory failure: Reintubated 01/02.  Extubated 01/05.  - Now stable on 2L [place].  10. Atrial fibrillation: She converted back to NSR overnight.  - Continue po amiodarone.   - On Heparin drip + warfarin. INR 1.2  11. ID: Suspected septic shock component, no definite source. All lines changed out last week and started on linezolid/meropenem, now just on meropenem.  Afebrile but WBCs up again to 19.  - Continue meropenem.  - Discussed with CCM, consider CT chest/abd/pelvis, will discuss with Dr. Leafy [name] tomorrow.   12. Thrombocytopenia: Suspect due to sepsis. Platelets mildly lower at 88K.  13. Deconditioning - Continue PT.  14. FEN: Getting tube feeds  Out of bed to chair.     CRITICAL CARE Performed by: Marca Ancona  Total critical care time: 40 minutes  Critical care time was exclusive of separately billable procedures and treating other patients.  Critical care was necessary to treat or prevent imminent or life-threatening deterioration.  Critical care was time spent personally by me on the following activities: development of treatment plan with patient and/or surrogate as well as nursing, discussions with consultants, evaluation of patient's response to treatment, examination of patient, obtaining history from patient or surrogate, ordering and performing treatments and interventions, ordering and review of laboratory studies, ordering and review of radiographic studies, pulse oximetry and re-evaluation of patient's condition.  Marca Ancona 12/19/2023 8:08 AM

## 2023-12-19 NOTE — Progress Notes (Signed)
PHARMACY - ANTICOAGULATION CONSULT NOTE  Pharmacy Consult for IV heparin transition to warfarin Indication: atrial fibrillation  No Known Allergies  Patient Measurements: Height: 5\' 3"  (160 cm) Weight: 73.3 kg (161 lb 9.6 oz) IBW/kg (Calculated) : 52.4 Heparin Dosing Weight: ~ 70 kg  Vital Signs: Temp: 98.2 F (36.8 C) (01/19 1105) Temp Source: Oral (01/19 1105) BP: 86/53 (01/19 1300) Pulse Rate: 87 (01/19 1300)  Labs: Recent Labs    12/17/23 0419 12/17/23 1009 12/17/23 1625 12/18/23 0419 12/18/23 1502 12/19/23 0448  HGB 10.6* 12.6  12.2  --  10.3*  --  9.4*  HCT 34.9* 37.0  36.0  --  33.1*  --  30.5*  PLT 95*  --   --  93*  --  88*  APTT 74*  --   --  61*  --  74*  LABPROT 14.9  --   --  15.1  --  15.6*  INR 1.2  --   --  1.2  --  1.2  HEPARINUNFRC 0.40  --   --  0.37  --  0.39  CREATININE 1.14*  --    < > 1.20* 1.27* 1.10*   < > = values in this interval not displayed.    Estimated Creatinine Clearance: 44.4 mL/min (A) (by C-G formula based on SCr of 1.1 mg/dL (H)).   Assessment: 73 yo female s/p scheduled bioprosthetic mitral valve replacement with tissue valve 12/30 now with afib, pharmacy asked to dose anticoagulation with IV heparin. Spontaneously converted 1/7; flipped back to AF overnight 1/9 0300. She is noted on DRRT.   -Heparin level= 0.39, aPTT= 74. hg= 9.4, plt= 88 -Elevated t bili (7.4 with trend down) may falsely lower heparin levels -the heparin level has been stable at goal on 950 units/hr since 12/15/22; aPTT low but I believe the heparin level is more reliable   Goal of Therapy:  Heparin level 0.3-0.5 units/ml aPTT 66-85 sec INR goal 2 - 3 (bMVR) Monitor platelets by anticoagulation protocol: Yes   Plan:  -Continue heparin at 950 units/hr -Warfarin 5 mg x1 today  -Daily heparin level, aPTT, CBC and INR  Reece Leader, Loura Back, BCPS, [facility] Clinical Pharmacist  12/19/2023 1:15 PM   [facility] pharmacy phone numbers are listed on  [email or web address]

## 2023-12-19 NOTE — Progress Notes (Signed)
2 Days Post-Op Procedure(s) (LRB): RIGHT HEART CATH (N/A) Subjective: Some nausea last night, none this AM  Objective: Vital signs in last 24 hours: Temp:  [97.4 F (36.3 C)-98.6 F (37 C)] 97.4 F (36.3 C) (01/19 0729) Pulse Rate:  [67-155] 91 (01/19 0745) Cardiac Rhythm: Atrial fibrillation (01/18 0800) Resp:  [13-30] 14 (01/19 0745) BP: (67-171)/(37-148) 91/54 (01/19 0745) SpO2:  [79 %-100 %] 98 % (01/19 0745) Weight:  [73.3 kg] 73.3 kg (01/19 0645)  Hemodynamic parameters for last 24 hours: CVP:  [3 mmHg-4 mmHg] 4 mmHg  Intake/Output from previous day: 01/18 0701 - 01/19 0700 In: 2360.6 [P.O.:240; I.V.:521.2; NG/GT:1269.4; IV Piggyback:300] Out: 1980 [Stool:360] Intake/Output this shift: No intake/output data recorded.  General appearance: flat affect Neurologic: nonfocal Heart: regular rate and rhythm Lungs: diminished breath sounds bibasilar Abdomen: mildly distended, nontender  Lab Results: Recent Labs    12/18/23 0419 12/19/23 0448  WBC 18.7* 19.4*  HGB 10.3* 9.4*  HCT 33.1* 30.5*  PLT 93* 88*   BMET:  Recent Labs    12/18/23 1502 12/19/23 0448  NA 133* 132*  K 4.9 4.7  CL 99 98  CO2 25 25  GLUCOSE 137* 173*  BUN 19 19  CREATININE 1.27* 1.10*  CALCIUM 8.4* 8.4*    PT/INR:  Recent Labs    12/19/23 0448  LABPROT 15.6*  INR 1.2   ABG    Component Value Date/Time   PHART 7.414 12/05/2023 0948   HCO3 27.4 12/17/2023 1009   HCO3 28.6 (H) 12/17/2023 1009   TCO2 29 12/17/2023 1009   TCO2 30 12/17/2023 1009   ACIDBASEDEF 1.0 12/05/2023 0948   O2SAT 50.1 12/19/2023 0448   CBG (last 3)  Recent Labs    12/19/23 0105 12/19/23 0446 12/19/23 0727  GLUCAP 160* 175* 106*    Assessment/Plan: S/P Procedure(s) (LRB): RIGHT HEART CATH (N/A) NEURO/PSYCH- flat affect, started on Zoloft CV- in SR on amiodarone  Milrinone 0.125, norepi 12, co-ox 50 this AM  CVP 4  Unable to wean norepi despite midodrine  On heparin drip RESP- 2L [place]-  IS RENAL- AKI on CRRT runniing even  Mild hyponatremia ENDO- CBG reasonably controlled Gi/ Nutrition- still with poor Po intake  Protein calorie malnutrition- nocturnal TF ID- afebrile, WBC continues to slowly rise but afebrile  On meropenem  No obvious source for leukocytosis Deconditioning - severe  LOS: 20 days    Loreli Slot 12/19/2023

## 2023-12-19 NOTE — Progress Notes (Signed)
Vanessa [name] PROGRESS NOTE  Assessment/ Plan: Pt is a 73 y.o. yo female  with a past medical history HTN, NICM, CHF, pulmonary hypertension, mitral and tricuspid valve disease who present w/ surgery for valvular disease c/b shock, AHRF, and AKI   # Anuric AKI on CKD 3A: Multifactorial etiology including cardiorenal syndrome and ischemic ATN due to shock.  Started CRRT on 1/2 for fluid overload.  The CRRT was held on 1/12 because of worsening leukocytosis and poor access with plan to have line holiday. IHD on 1/13 with IDH and no UF. Resumed CRRT on 1/14 to manage volume.  Tolerating well.    #Multifactorial shock: Vasoplegic and cardioplegia catheter surgery.   - midodrine, milrinone, and norepi. Cont mgmt per primary team   #Acute exacerbation of heart failure with reduced ejection fraction: Volume overload. Optimized with CRRT   #Severe MR and tricuspid valvular disease:  - Status post repair.  CT surgery managing   #Postoperative cardiac tamponade:  - Continue monitoring per cardiology   # Hyponatremia: improved with CRRT    #Anemia: Related to blood loss and compounded by AKI and critical illness.    - Transfusions per primary team.   - aranesp 40 mcg weekly on Tuesdays   # Thrombocytopenia: switched from heparin to coumadin. stable   Subjective: Remains on CRRT. Near even. Norepi 11 and on milrinone. No complaints.   Objective Vital signs in last 24 hours: Vitals:   12/19/23 0700 12/19/23 0715 12/19/23 0729 12/19/23 0730  BP: (!) 95/56 98/62  (!) 89/39  Pulse: 93 92  97  Resp: 13 13  18   Temp:   (!) 97.4 F (36.3 C)   TempSrc:   Oral   SpO2: 99% 98%  94%  Weight:      Height:       Weight change: 0.9 kg  Intake/Output Summary (Last 24 hours) at 12/19/2023 0738 Last data filed at 12/19/2023 0700 Gross per 24 hour  Intake 2360.57 ml  Output 1980 ml  Net 380.57 ml       Labs: RENAL PANEL Recent Labs  Lab 12/15/23 0251  12/15/23 1608 12/16/23 0417 12/16/23 1601 12/17/23 0419 12/17/23 1009 12/17/23 1625 12/18/23 0419 12/18/23 1502 12/19/23 0448  NA 133*   < >  --    < > 134* 133*  133* 133* 131* 133* 132*  K 4.3   < >  --    < > 4.8 5.4*  5.4* 5.2* 5.0 4.9 4.7  CL 101   < >  --    < > 98  --  97* 97* 99 98  CO2 23   < >  --    < > 24  --  23 24 25 25   GLUCOSE 164*   < >  --    < > 152*  --  214* 175* 137* 173*  BUN 25*   < >  --    < > 21  --  22 23 19 19   CREATININE 1.66*   < >  --    < > 1.14*  --  1.34* 1.20* 1.27* 1.10*  CALCIUM 7.7*   < >  --    < > 9.1  --  8.5* 8.5* 8.4* 8.4*  MG 2.3  --  2.6*  --  2.7*  --   --  2.8*  --  2.7*  PHOS 3.8   < >  --    < > 2.8  --  3.3 3.2 2.7 3.0  ALBUMIN 2.3*   < > 2.6*   < > 2.9*  --  2.8* 2.8* 2.7* 2.6*   < > = values in this interval not displayed.    Liver Function Tests: Recent Labs  Lab 12/12/23 0939 12/13/23 0303 12/14/23 0240 12/16/23 0417 12/16/23 1601 12/18/23 0419 12/18/23 1502 12/19/23 0448  AST 41 39  --  35  --   --   --   --   ALT 44 44  --  45*  --   --   --   --   ALKPHOS 76 66  --  64  --   --   --   --   BILITOT 13.0* 11.9*  --  7.4*  --   --   --   --   PROT 7.5 7.4  --  7.0  --   --   --   --   ALBUMIN 2.9* 2.7*  2.8*   < > 2.6*   < > 2.8* 2.7* 2.6*   < > = values in this interval not displayed.   No results for input(s): "LIPASE", "AMYLASE" in the last 168 hours. No results for input(s): "AMMONIA" in the last 168 hours.  CBC: Recent Labs    12/16/23 0417 12/17/23 0419 12/17/23 1009 12/18/23 0419 12/19/23 0448  HGB 9.5* 10.6* 12.6  12.2 10.3* 9.4*  MCV 101.6* 100.6*  --  103.1* 103.0*    Cardiac Enzymes: No results for input(s): "CKTOTAL", "CKMB", "CKMBINDEX", "TROPONINI" in the last 168 hours. CBG: Recent Labs  Lab 12/18/23 2019 12/18/23 2024 12/19/23 0105 12/19/23 0446 12/19/23 0727  GLUCAP >600* 133* 160* 175* 106*    Iron Studies: No results for input(s): "IRON", "TIBC", "TRANSFERRIN",  "FERRITIN" in the last 72 hours. Studies/Results: CARDIAC CATHETERIZATION Result Date: 12/17/2023 1. Low filling pressures. 2. Mild pulmonary artery hypertension 3. Low cardiac output, CI 1.74 by Fick. 4. Preserved PAPi I will restart milrinone at 0.125 mcg/kg/min     Medications: Infusions:   prismasol BGK 4/2.5 400 mL/hr at 12/19/23 0655    prismasol BGK 4/2.5 400 mL/hr at 12/19/23 0655   sodium chloride 10 mL/hr at 12/18/23 1416   anticoagulant sodium citrate     heparin 950 Units/hr (12/19/23 0700)   meropenem (MERREM) IV Stopped (12/19/23 0552)   milrinone 0.125 mcg/kg/min (12/19/23 0700)   norepinephrine (LEVOPHED) Adult infusion 12 mcg/min (12/19/23 0700)   prismasol BGK 4/2.5 1,500 mL/hr at 12/19/23 0450    Scheduled Medications:  amiodarone  200 mg Oral BID   ascorbic acid  250 mg Per Tube BID   Chlorhexidine Gluconate Cloth  6 each Topical Daily   darbepoetin (ARANESP) injection - NON-DIALYSIS  40 mcg Subcutaneous Q Tue-1800   feeding supplement (VITAL AF 1.2 CAL)  660 mL Per Tube Q24H   fiber supplement (BANATROL TF)  60 mL Per Tube QID   Gerhardt's butt cream   Topical BID   influenza vaccine adjuvanted  0.5 mL Intramuscular Tomorrow-1000   insulin aspart  0-24 Units Subcutaneous Q4H   melatonin  3 mg Oral Q24H   midodrine  15 mg Per Tube Q8H   multivitamin  1 tablet Per Tube BID   QUEtiapine  25 mg Oral QHS   sertraline  25 mg Oral Daily   Warfarin - Pharmacist Dosing Inpatient   Does not apply q1600   zinc sulfate (50mg  elemental zinc)  220 mg Per Tube Daily    have reviewed scheduled and prn  medications.  Physical Exam: General:NAD, comfortable Heart:normal rate, no rub Lungs:  Bilateral chest rise, no iwob Abdomen:soft, Non-tender, non-distended Extremities: Peripheral  edema trace, cool lower extremities  Dialysis Access: .  Right IJ temporary HD catheter was replaced by PCCM on 1/14. Vanessa [name] 12/19/2023,7:38 AM  LOS: 20 days

## 2023-12-19 NOTE — Progress Notes (Signed)
12/19/2023 Stable day/night Pressors needs fluctuate: high when asleep low during day Remains on CRRT Discussed case with AHF; if WBC keeps rising may need CT tomorrow to eval sternum.  Myrla Halsted MD PCCM

## 2023-12-19 NOTE — Progress Notes (Signed)
      [address]       [place] [address]             [phone]       No new issues She did get up to the chair today  BP 95/60   Pulse 86   Temp 97.7 F (36.5 C) (Oral)   Resp 16   Ht 5\' 3"  (1.6 m)   Wt 73.3 kg   SpO2 92%   BMI 28.63 kg/m    Intake/Output Summary (Last 24 hours) at 12/19/2023 1722 Last data filed at 12/19/2023 1700 Gross per 24 hour  Intake 2626.01 ml  Output 2057.1 ml  Net 568.91 ml   Norepi at 7, milrinone 0.125  Viviann Spare C. Dorris Fetch, MD [facility] [phone]

## 2023-12-19 NOTE — Progress Notes (Signed)
Pharmacy Antibiotic Note  Vanessa [name] is a 74 y.o. female admitted on 11/29/2023 for valve repair, now w/ concern for sepsis given pressor requirements and leukocytosis. Patient has not improved despite full courses of vancomycin + meropenem. Pharmacy has been consulted for linezolid and Zosyn dosing.  Pt on CRRT. Discussed with CCM, line removed on 1/14 however patient is still requiring vasopressors with resumption of CVVHD. Unclear source of infection at this time.  Antibiotics: Zosyn 1/11 > 1/13 Cefazolin 12/30>1/1 Vancomycin 1/3 > 1/9 Merrem 1/3 > 1/9; 1/13 >> (1/26) Linezolid 1/11 >> 1/18  Culture/Panel Data: SARS-Cov-2: negative Bcx 1/13: negative 2/2 Trach Aspirate 1/13: rare yeast Urine catheter 1/14: no growth at 1 day  Plan: Adjust meropenem to 2g q 12 hrs based on renal function. F/u CRRT plans, currently pulling for volume status Per CCM, may consider stop date once NE < 8 and off CRRT with line holiday.  Height: 5\' 3"  (160 cm) Weight: 73.3 kg (161 lb 9.6 oz) IBW/kg (Calculated) : 52.4  Temp (24hrs), Avg:98.1 F (36.7 C), Min:97.4 F (36.3 C), Max:98.6 F (37 C)  Recent Labs  Lab 12/15/23 0253 12/15/23 1608 12/16/23 0417 12/16/23 1601 12/17/23 0419 12/17/23 1625 12/18/23 0419 12/18/23 1502 12/19/23 0448  WBC 14.7*  --  15.5*  --  16.0*  --  18.7*  --  19.4*  CREATININE  --    < >  --    < > 1.14* 1.34* 1.20* 1.27* 1.10*   < > = values in this interval not displayed.    Estimated Creatinine Clearance: 44.4 mL/min (A) (by C-G formula based on SCr of 1.1 mg/dL (H)).    No Known Allergies  Thank you for allowing pharmacy to be a part of this patient's care.  Reece Leader, Loura Back, BCPS, BCCP Clinical Pharmacist  12/19/2023 2:08 PM   [facility] pharmacy phone numbers are listed on [email or web address]

## 2023-12-19 NOTE — Plan of Care (Signed)
  Problem: Clinical Measurements: Goal: Respiratory complications will improve Outcome: Progressing   

## 2023-12-20 ENCOUNTER — Encounter (HOSPITAL_COMMUNITY): Payer: Self-pay | Admitting: Cardiology

## 2023-12-20 ENCOUNTER — Inpatient Hospital Stay (HOSPITAL_COMMUNITY): Payer: PPO

## 2023-12-20 DIAGNOSIS — I340 Nonrheumatic mitral (valve) insufficiency: Secondary | ICD-10-CM | POA: Diagnosis not present

## 2023-12-20 DIAGNOSIS — R570 Cardiogenic shock: Secondary | ICD-10-CM | POA: Diagnosis not present

## 2023-12-20 DIAGNOSIS — Z952 Presence of prosthetic heart valve: Secondary | ICD-10-CM | POA: Diagnosis not present

## 2023-12-20 LAB — RENAL FUNCTION PANEL
Albumin: 2.5 g/dL — ABNORMAL LOW (ref 3.5–5.0)
Albumin: 2.3 g/dL — ABNORMAL LOW (ref 3.5–5.0)
Anion gap: 6 (ref 5–15)
Anion gap: 8 (ref 5–15)
BUN: 16 mg/dL (ref 8–23)
BUN: 19 mg/dL (ref 8–23)
CO2: 27 mmol/L (ref 22–32)
CO2: 24 mmol/L (ref 22–32)
Calcium: 8.3 mg/dL — ABNORMAL LOW (ref 8.9–10.3)
Calcium: 8.1 mg/dL — ABNORMAL LOW (ref 8.9–10.3)
Chloride: 100 mmol/L (ref 98–111)
Chloride: 100 mmol/L (ref 98–111)
Creatinine, Ser: 1.26 mg/dL — ABNORMAL HIGH (ref 0.44–1.00)
Creatinine, Ser: 1.61 mg/dL — ABNORMAL HIGH (ref 0.44–1.00)
GFR, Estimated: 45 mL/min — ABNORMAL LOW
GFR, Estimated: 34 mL/min — ABNORMAL LOW (ref >60)
Glucose, Bld: 157 mg/dL — ABNORMAL HIGH (ref 70–99)
Glucose, Bld: 149 mg/dL — ABNORMAL HIGH (ref 70–99)
Phosphorus: 2.6 mg/dL (ref 2.5–4.6)
Phosphorus: 2.5 mg/dL (ref 2.5–4.6)
Potassium: 4.4 mmol/L (ref 3.5–5.1)
Potassium: 4.4 mmol/L (ref 3.5–5.1)
Sodium: 133 mmol/L — ABNORMAL LOW (ref 135–145)
Sodium: 132 mmol/L — ABNORMAL LOW (ref 135–145)

## 2023-12-20 LAB — GLUCOSE, CAPILLARY
Glucose-Capillary: 110 mg/dL — ABNORMAL HIGH (ref 70–99)
Glucose-Capillary: 134 mg/dL — ABNORMAL HIGH (ref 70–99)
Glucose-Capillary: 135 mg/dL — ABNORMAL HIGH (ref 70–99)
Glucose-Capillary: 136 mg/dL — ABNORMAL HIGH (ref 70–99)
Glucose-Capillary: 153 mg/dL — ABNORMAL HIGH (ref 70–99)
Glucose-Capillary: 89 mg/dL (ref 70–99)

## 2023-12-20 LAB — COOXEMETRY PANEL
Carboxyhemoglobin: 2.8 % — ABNORMAL HIGH (ref 0.5–1.5)
Carboxyhemoglobin: 1.7 % — ABNORMAL HIGH (ref 0.5–1.5)
Methemoglobin: 1.5 % (ref 0.0–1.5)
Methemoglobin: 0.7 % (ref 0.0–1.5)
O2 Saturation: 81.2 %
O2 Saturation: 63.5 %
Total hemoglobin: <6.9 g/dL — CL (ref 12.0–16.0)
Total hemoglobin: 8.4 g/dL — ABNORMAL LOW (ref 12.0–16.0)

## 2023-12-20 LAB — APTT: aPTT: 74 s — ABNORMAL HIGH (ref 24–36)

## 2023-12-20 LAB — CBC
HCT: 28 % — ABNORMAL LOW (ref 36.0–46.0)
Hemoglobin: 8.5 g/dL — ABNORMAL LOW (ref 12.0–15.0)
MCH: 32.3 pg (ref 26.0–34.0)
MCHC: 30.4 g/dL (ref 30.0–36.0)
MCV: 106.5 fL — ABNORMAL HIGH (ref 80.0–100.0)
Platelets: 86 K/uL — ABNORMAL LOW (ref 150–400)
RBC: 2.63 MIL/uL — ABNORMAL LOW (ref 3.87–5.11)
RDW: 24.1 % — ABNORMAL HIGH (ref 11.5–15.5)
WBC: 18.8 K/uL — ABNORMAL HIGH (ref 4.0–10.5)
nRBC: 2 % — ABNORMAL HIGH (ref 0.0–0.2)

## 2023-12-20 LAB — PROTIME-INR
INR: 1.3 — ABNORMAL HIGH (ref 0.8–1.2)
Prothrombin Time: 15.9 s — ABNORMAL HIGH (ref 11.4–15.2)

## 2023-12-20 LAB — HEPARIN LEVEL (UNFRACTIONATED): Heparin Unfractionated: 0.31 [IU]/mL (ref 0.30–0.70)

## 2023-12-20 LAB — MAGNESIUM: Magnesium: 2.7 mg/dL — ABNORMAL HIGH (ref 1.7–2.4)

## 2023-12-20 MED ORDER — LACTATED RINGERS IV BOLUS
500.0000 mL | Freq: Once | INTRAVENOUS | Status: AC
  Administered 2023-12-20: 500 mL via INTRAVENOUS

## 2023-12-20 MED ORDER — WARFARIN SODIUM 5 MG PO TABS
5.0000 mg | ORAL_TABLET | ORAL | Status: AC
  Administered 2023-12-20: 5 mg via ORAL
  Filled 2023-12-20: qty 1

## 2023-12-20 NOTE — Progress Notes (Signed)
Physical Therapy Treatment Patient Details Name: Vanessa [name] MRN: [id number] DOB: 23-Sep-1951 Today's Date: 12/20/2023   History of Present Illness 73 yo F admitted 12/30 with DOE, increasing MR and TR. LCH without CAD. 12/30 MVR and tricuspid valve repair. Intubated 1/2-1/5. 1/2 CRRT started/stopped on 1/12. Pt HD line was removed on 1/13 and CRRT was re-started on 1/14.  PMhx: HTN, NICM, HFrEF (EF40-45%), Pulmonary HTN, CKD and  severe MR s/p prior mitral clip x 2 in 2020.    PT Comments  Pt was able to begin ambulation this date with max encouragement. Pt able to amb 10' with eva walker. Pt remains to have generalized weakness, poor activity tolerance, soft BP, and LE edema limited activity tolerance however I slowly progressing towards all goals. Acute PT to cont to follow.    If plan is discharge home, recommend the following: A lot of help with walking and/or transfers;A lot of help with bathing/dressing/bathroom;Assistance with cooking/housework   Can travel by private vehicle        Equipment Recommendations  Rolling walker (2 wheels);BSC/3in1    Recommendations for Other Services Rehab consult     Precautions / Restrictions Precautions Precautions: Sternal;Fall;Other (comment) Precaution Booklet Issued: No Precaution Comments: cortrak, watch vitals, CRRT Restrictions Weight Bearing Restrictions Per Provider Order: Yes RUE Weight Bearing Per Provider Order: Non weight bearing (sternal precautions) LUE Weight Bearing Per Provider Order: Non weight bearing (sternal precautions) Other Position/Activity Restrictions: sternal precautions     Mobility  Bed Mobility Overal bed mobility: Needs Assistance Bed Mobility: Rolling, Sidelying to Sit, Sit to Sidelying Rolling: Min assist Sidelying to sit: HOB elevated, Min assist       General bed mobility comments: with verbal cues pt able to bring LEs towards EOB, minA to bring hips over to EOB and for trunk elevation with HOB  elevated    Transfers Overall transfer level: Needs assistance Equipment used: Rolling walker (2 wheels), 2 person hand held assist Transfers: Sit to/from Stand Sit to Stand: Mod assist, +2 safety/equipment           General transfer comment: 3 trials, pt with limited ability to push up with LEs requiring modAx2 to power up    Ambulation/Gait Ambulation/Gait assistance: Mod assist, +2 physical assistance, +2 safety/equipment Gait Distance (Feet): 10 Feet Assistive device: Fara Boros Gait Pattern/deviations: Step-through pattern, Decreased stride length, Wide base of support Gait velocity: dec Gait velocity interpretation: <1.31 ft/sec, indicative of household ambulator   General Gait Details: With encouragement pt able to amb from EOB to the door this date with EVA walker, max encouragement, and modAx2 to support patient and guide eva walker, 3rd person to follow with chair   Stairs             Wheelchair Mobility     Tilt Bed    Modified Rankin (Stroke Patients Only)       Balance Overall balance assessment: Needs assistance Sitting-balance support: No upper extremity supported, Feet supported, Feet unsupported Sitting balance-Leahy Scale: Fair     Standing balance support: Bilateral upper extremity supported, Reliant on assistive device for balance Standing balance-Leahy Scale: Poor Standing balance comment: RW in standing and external support with fatigue                            Cognition Arousal: Alert Behavior During Therapy: WFL for tasks assessed/performed, Flat affect Overall Cognitive Status: Within Functional Limits for tasks assessed  General Comments: pt with depressed spirits but responded well to PT and PT tech and smiled/laughed        Exercises      General Comments General comments (skin integrity, edema, etc.): pt disconnected from CRRT at the moment, BP soft but has been  soft consistently      Pertinent Vitals/Pain Pain Assessment Pain Assessment: No/denies pain Pain Location: zero pain until onset of sternal chest pain at end of PT session Pain Descriptors / Indicators: Dull Pain Intervention(s): Monitored during session    Home Living                          Prior Function            PT Goals (current goals can now be found in the care plan section) Acute Rehab PT Goals Patient Stated Goal: return home PT Goal Formulation: With patient Time For Goal Achievement: 01/03/24 Potential to Achieve Goals: Good Progress towards PT goals: Progressing toward goals    Frequency    Min 1X/week      PT Plan      Co-evaluation              AM-PAC PT "6 Clicks" Mobility   Outcome Measure  Help needed turning from your back to your side while in a flat bed without using bedrails?: A Lot Help needed moving from lying on your back to sitting on the side of a flat bed without using bedrails?: A Lot Help needed moving to and from a bed to a chair (including a wheelchair)?: A Lot Help needed standing up from a chair using your arms (e.g., wheelchair or bedside chair)?: A Lot Help needed to walk in hospital room?: Total Help needed climbing 3-5 steps with a railing? : Total 6 Click Score: 10    End of Session Equipment Utilized During Treatment: Oxygen Activity Tolerance: Patient limited by fatigue Patient left: with call bell/phone within reach;in chair;with chair alarm set;with nursing/sitter in room Nurse Communication: Mobility status PT Visit Diagnosis: Other abnormalities of gait and mobility (R26.89);Muscle weakness (generalized) (M62.81);Difficulty in walking, not elsewhere classified (R26.2)     Time: 1308-6578 PT Time Calculation (min) (ACUTE ONLY): 24 min  Charges:    $Gait Training: 8-22 mins $Therapeutic Activity: 8-22 mins PT General Charges $$ ACUTE PT VISIT: 1 Visit                     Vanessa [name],  PT, DPT Acute Rehabilitation Services Secure chat preferred Office #: [phone]    Vanessa [name] 12/20/2023, 1:38 PM

## 2023-12-20 NOTE — Progress Notes (Signed)
NAME:  Vanessa [name], MRN:  [id number], DOB:  July 08, 1951, LOS: 21 ADMISSION DATE:  11/29/2023, CONSULTATION DATE:  11/29/23 REFERRING MD:  Dr. Leafy Ro, CHIEF COMPLAINT:  postop   History of Present Illness:  72yoF with PMH significant for HTN, NICM, HFrEF (EF40-45%), PHTN, CKD and hx of prior severe MR s/p prior mitral clip x 2 in 2020.  Pt with progressive DOE found to have increasing MR  and moderate TR despite adjustments in GDMT.  Not felt to be candidate for additional mitral clip.  Repeat LHC without CAD but showed moderate PHTN.  Underwent mitral valve replacement with tissue valve and tricuspid valve repair by Dr. Leafy Ro on 12/30.  PCCM consulted to assist with vent and medical management post-operatively in ICU.   Pertinent  Medical History  Never smoker, MR w/ previous Mitral clip x 2 (2020), NICM, HFrEF, PHTN, HTN, CKD3b  Significant Hospital Events: Including procedures, antibiotic start and stop dates in addition to other pertinent events   MVR by Dr. Leafy Ro; relook for tamponade/washout later in evening without obvious source 1/2 intubated for respiratory decompensation, iNO started. Vanc, meropenem started. CRRT started.  1/5 extubated 1/8 converted to NSR overnight 1/9 back to A.fib around 0200 1/11 R introducer out.  Unable to thread right subclavian catheter 1/12 remains weak still pressor dependent 1/13 repeat [facility] sent. Still on pressors..,  Zosyn changed to meropenem.  Chest abdomen and pelvis CT obtained : Moderate hemorrhagic pericardial effusion, 5.3 cm hematoma right thrombus. Arterial gas bilateral patchy opacities mild dependent posterior right upper lobe atelectasis was unremarkable layering gallstones  1/14 new right IJ HD catheter placed.  The echocardiogram Was obtained to better evaluate her EF is 35 to 40%.  The LV demonstrates regional wall motion abnormalities there is asymmetric left ventricular hypertrophy of the inferior lateral segment RV systolic  function moderate regular reduced left atrial wall dilated no mention of clot.  1/15 still on fairly high-dose norepinephrine 1/16 cont CRRT + low dose NE  1/17 RHC confirms low output state, restarted on milrinone Interim History / Subjective:  Continues to need more pressors at night less during day.    prismasol BGK 4/2.5 400 mL/hr at 12/19/23 2004    prismasol BGK 4/2.5 400 mL/hr at 12/19/23 2004   sodium chloride 10 mL/hr at 12/18/23 1416   anticoagulant sodium citrate     heparin 950 Units/hr (12/20/23 0500)   meropenem (MERREM) IV Stopped (12/19/23 2141)   milrinone 0.125 mcg/kg/min (12/20/23 0500)   norepinephrine (LEVOPHED) Adult infusion 10 mcg/min (12/20/23 0500)   prismasol BGK 4/2.5 1,500 mL/hr at 12/20/23 0512     Objective   Blood pressure (!) 96/54, pulse 88, temperature 98.5 F (36.9 C), temperature source Oral, resp. rate 12, height 5\' 3"  (1.6 m), weight 73.3 kg, SpO2 97%. CVP:  [4 mmHg] 4 mmHg      Intake/Output Summary (Last 24 hours) at 12/20/2023 0549 Last data filed at 12/20/2023 0500 Gross per 24 hour  Intake 2661.09 ml  Output 2221 ml  Net 440.09 ml   Filed Weights   12/17/23 0436 12/18/23 0634 12/19/23 0645  Weight: 73.2 kg 72.4 kg 73.3 kg   Examination:  No distress Moves to command RASS 0 Globally weak Cortrak in place No edema  Coox 81? WBC down slightly Plts stable low H/H down 1 point CXR stable LLL atelectasis vs. infiltrate  Resolved Hospital Problem list :   Postoperative vent management Postop tamponade resolved after relook and washout on day 1  Assessment & Plan:   Sev Mitral regurg s/p prior mitral clip, now s/p MVR Mod tricuspid regurg s/p TV repair Persistent cardioplegia postop- confirmed on RHC pHTN AoC HFrEF NICM pAF, flutter  ?Sepsis unclear source  Afib  Thrombocytopenia stable Acute renal failure  Protein calorie malnutrition Physical deconditioning  Deep tissue injury on left buttocks approximately 2 cm in  diameter purple discoloration Situational depression Sleep disturbance  - CRRT even - Heparin gtt - May need to try lower MAP goals and see if there are any other signs of end organ malperfusion - Consider DC milrinone - Trend WBC, consider pan scan if goes back up - Up to chair as tolerated - IS  Best Practice (right click and "Reselect all SmartList Selections" daily)   Diet/type: Encourage PO but her appetite has been poor and she is grossly reliant on EN still  DVT prophylaxis systemic heparin --> warfarin  Pressure ulcer(s): L buttocks  GI prophylaxis: N/A Lines: HD cath  Foley:  N/A Code Status:  full code Last date of multidisciplinary goals of care discussion [per primary]  32 min cc time 12/20/2023, 5:49 AM

## 2023-12-20 NOTE — Progress Notes (Addendum)
Patient ID: Vanessa [name], female   DOB: January 29, 1951, 73 y.o.   MRN: [id number]      Advanced Heart Failure Rounding Note  Cardiologist: Dr. Shirlee Latch  Chief Complaint: Cardiogenic Shock  Subjective:   12/30 S/P MVR (tissue) and TV ring and same day return to OR for tamponade/bleeding. 1/2: Progressive volume overload with poor UOP, CVVH started but ended up re-intubated.   Extubated 12/05/23 1/12: CVVH stopped 1/13: iHD, HD line removed 1/14: CRRT Restarted.  1/17: RHC mean RA 2, PA 47/20, mean PCWP 5, CI 1.74, PVR 8, PAPi 9.  Milrinone 0.125 started.     NE 11 mcg +  milrinone 0.125.  Co-ox 81 % (repeat now)   Remains on CRRT-running even   Feeling better today. Slept well.     Objective:   Weight Range: 73.6 kg Body mass index is 28.74 kg/m.   Vital Signs:   Temp:  [97.4 F (36.3 C)-98.5 F (36.9 C)] 98.4 F (36.9 C) (01/20 0400) Pulse Rate:  [82-104] 84 (01/20 0600) Resp:  [12-36] 12 (01/20 0600) BP: (59-109)/(28-69) 77/47 (01/20 0600) SpO2:  [76 %-100 %] 98 % (01/20 0600) Weight:  [73.6 kg] 73.6 kg (01/20 0623) Last BM Date : 12/19/23  Weight change: Filed Weights   12/18/23 0634 12/19/23 0645 12/20/23 0623  Weight: 72.4 kg 73.3 kg 73.6 kg   Intake/Output:   Intake/Output Summary (Last 24 hours) at 12/20/2023 0707 Last data filed at 12/20/2023 0700 Gross per 24 hour  Intake 2537 ml  Output 2167 ml  Net 370 ml   CVP 3-4 Physical Exam  General:   No resp difficulty. On CRRT HEENT: normal Neck: supple. no JVD. Carotids 2+ bilat; no bruits. No lymphadenopathy or thryomegaly appreciated. RIJ  Cor: PMI nondisplaced. Regular rate & rhythm. No rubs, gallops or murmurs. Lungs: clear Abdomen: soft, nontender, nondistended. No hepatosplenomegaly. No bruits or masses. Good bowel sounds. + Flexi Seal Extremities: no cyanosis, clubbing, rash, edema Neuro: alert & orientedx3, cranial nerves grossly intact. moves all 4 extremities w/o difficulty. Affect  pleasant   Telemetry   SR 80-90s with occasional pVCs   Labs    CBC Recent Labs    12/19/23 0448 12/20/23 0453  WBC 19.4* 18.8*  HGB 9.4* 8.5*  HCT 30.5* 28.0*  MCV 103.0* 106.5*  PLT 88* 86*   Basic Metabolic Panel Recent Labs    52/84/13 0448 12/19/23 1522 12/20/23 0453  NA 132* 133* 133*  K 4.7 4.5 4.4  CL 98 100 100  CO2 25 23 27   GLUCOSE 173* 157* 157*  BUN 19 15 16   CREATININE 1.10* 1.15* 1.26*  CALCIUM 8.4* 8.1* 8.3*  MG 2.7*  --  2.7*  PHOS 3.0 2.4* 2.6   Liver Function Tests Recent Labs    12/19/23 1522 12/20/23 0453  ALBUMIN 2.5* 2.5*   No results for input(s): "LIPASE", "AMYLASE" in the last 72 hours. Cardiac Enzymes No results for input(s): "CKTOTAL", "CKMB", "CKMBINDEX", "TROPONINI" in the last 72 hours.  BNP: BNP (last 3 results) Recent Labs    05/26/23 1610 08/26/23 1103 09/22/23 0905  BNP 778.0* 781.0* 926.0*   Other results:  Imaging   No results found.        Medications:   Scheduled Medications:  amiodarone  200 mg Oral BID   ascorbic acid  250 mg Per Tube BID   Chlorhexidine Gluconate Cloth  6 each Topical Daily   darbepoetin (ARANESP) injection - NON-DIALYSIS  40 mcg Subcutaneous Q Tue-1800  feeding supplement (VITAL AF 1.2 CAL)  660 mL Per Tube Q24H   fiber supplement (BANATROL TF)  60 mL Per Tube QID   Gerhardt's butt cream   Topical BID   influenza vaccine adjuvanted  0.5 mL Intramuscular Tomorrow-1000   insulin aspart  0-24 Units Subcutaneous Q4H   melatonin  3 mg Oral Q24H   midodrine  15 mg Per Tube Q8H   multivitamin  1 tablet Per Tube BID   QUEtiapine  25 mg Oral QHS   sertraline  25 mg Oral Daily   Warfarin - Pharmacist Dosing Inpatient   Does not apply q1600   zinc sulfate (50mg  elemental zinc)  220 mg Per Tube Daily    Infusions:   prismasol BGK 4/2.5 400 mL/hr at 12/19/23 2004    prismasol BGK 4/2.5 400 mL/hr at 12/19/23 2004   sodium chloride 10 mL/hr at 12/18/23 1416   anticoagulant sodium  citrate     heparin 950 Units/hr (12/20/23 0700)   meropenem (MERREM) IV Stopped (12/19/23 2141)   milrinone 0.125 mcg/kg/min (12/20/23 0700)   norepinephrine (LEVOPHED) Adult infusion 11 mcg/min (12/20/23 0700)   prismasol BGK 4/2.5 1,500 mL/hr at 12/20/23 0512    PRN Medications: sodium chloride, alteplase, anticoagulant sodium citrate, artificial tears, bisacodyl **OR** bisacodyl, heparin, heparin, HYDROmorphone (DILAUDID) injection, levalbuterol, lidocaine (PF), lidocaine-prilocaine, ondansetron (ZOFRAN) IV, mouth rinse, oxyCODONE, pentafluoroprop-tetrafluoroeth, pneumococcal 20-valent conjugate vaccine, sodium chloride, traZODone  Patient Profile   DAIVA SINER is a 73 y.o. with history of nonischemic cardiomyopathy and severe MR s/p Mitral Clip x2 (2020), pHTN, and CKD IIIb.   Assessment/Plan   1. Post Cardiotomy Cardiogenic & septic shock: Bioprosthetic MVR and tricuspid ring 11/29/23 with Dr. Leafy Ro. Pre-op cath with no significant CAD.  Pre-op echo with EF 40-45%, severe MR. Went back to the OR with post-op tamponade for clean-out.  Echo 12/31 showed EF 25-30%, moderate RV dysfunction, s/p bioprosthetic mitral valve replacement with trivial MR and tricuspid valve repair with trivial TR. Worsened on 1/2 and re-intubated in setting of volume overload, oliguric renal failure &  pulmonary hypertension. Extubated on 12/04/22. 1/13 CT C/A/P  moderate hemorrhagic pericardial effusion and 5.3 cm hematoma right heart border.  Repeat echo no tamponade. Required CRRT for volume removal.  CVVH stopped 1/12 then restarted 01/14 for volume removal. RHC 1/17 showed low filling pressures and mild pulmonary arterial hypertension but CI 1.74.   - Stop milrinone. Wean Norepi.CO-OX 81%. Continue midodrine 15 mg three times a day.  -   Running CVVH even with weight down and CVP 3-5  2. Mitral regurgitation:  TEE in 11/19 with severe MR, restricted posterior leaflet.  She had Mitraclip x 2 in 1/20. Post-op  echo showed mild MR and mild mitral stenosis.   Echo in 9/24 showed MV s/p Mitraclip x 2 with mean gradient 6 mmHg and severe mitral regurgitation. TEE was then done to evaluate, showing 2 Mitraclips in place with mean gradient 4 mmHg and severe eccentric MR from between the 2 clips with ERO 0.54 cm^2.  She was not thought to be a candidate for an additional Mitraclip due to lack of room between the two existing clips. R/LHC (11/24) showed moderate mixed pulmonary arterial and pulmonary venous hypertension likely due to mitral valve regurgitation.  S/p MV replacement and Tricuspid repair on 11/29/23 with Dr. Leafy Ro.  - Last echo stable valves.  3. Ventricular ectopy:  - Continue amiodarone 200 mg bid.  4. Pulmonary hypertension: Most recent pre-op RHC (11/24) with PVR  4.5, with moderate mixed pulmonary arterial and pulmonary venous hypertension likely due to mitral valve regurgitation. Now s/p MVR, PA pressure severely elevated post-op.  Suspect still mixed PAH/PVH with elevated PCWP.  NO was tried at 10 ppm but did not have much effect so now off.  Will hold off on pulmonary vasodilators; small clinical trials demonstrate no improvement and some markers of harm with PDE5i in PH in the setting of mitral valve disease. RHC 1/17 with mild pulmonary arterial hypertension.  5. 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 TSH was normal in 8/24. Follows with Endocrinology 6. AKI on CKD stage 3:  1/14 CRRT restarted. Running even Currently running even.   - Working to wean off NE to get her to [facility].  7. Blood loss anemia - Hgb 8.5 8. Complete heart block: She had post-op CHB. Resolved.  9. Hypoxic / Hypercapnic respiratory failure: Reintubated 01/02.  Extubated 01/05.  -Continue 2 liters.  10. Atrial fibrillation:  - Maintaining SR. .  - Continue po amiodarone.   - On Heparin drip + warfarin. INR 1.3  11. ID: Suspected septic shock component, no definite source. All lines changed out last week  and started on linezolid/meropenem, now just on meropenem.  Afebrile but WBCs up again to 18  - Continue meropenem.  - Discussed with CCM, consider CT chest/abd/pelvis, will discuss with Dr. Leafy Ro tomorrow.   12. Thrombocytopenia: Suspect due to sepsis. Platelets mildly lower at 88K.  13. Deconditioning - Continue PT.  14. FEN: Getting tube feeds  Discussed with CCM. Try to wean pressors. Follow closely.   Amy Clegg NP-C  12/20/2023 7:07 AM  Agree with above.   Remains on CVVHD. Anuric. On milrinone and NE. Co-ox 81% Weight back to baseline. CVP 3-5  Now in NSR on IV amio  Was up to chair yesterday.   Very depressed. Denies CP, orthopnea or PND  General:  Sitting up in bed  No resp difficulty HEENT: normal + Cor-trak Neck: supple. no JVD. + HD cath Cor: Sternal wound ok. Regular rate & rhythm. No rubs, gallops or murmurs. Lungs: clear Abdomen: soft, nontender, nondistended. No hepatosplenomegaly. No bruits or masses. Good bowel sounds. Extremities: no cyanosis, clubbing, rash, edema Neuro: alert & orientedx3, cranial nerves grossly intact. moves all 4 extremities w/o difficulty. Affect pleasant  Remains on dual inotropes. Will wean off milrinone. Then wean NE as tolerated. (Can switch to DBA as needed). Titrate midodrine.   Hopefully can get her to iHD at some point this week. Continue amio/heparin/warfarin for AF.   On meropenum. WBC climbing. Low-threshold for CT  CRITICAL CARE Performed by: Arvilla Meres  Total critical care time: 31 minutes  Critical care time was exclusive of separately billable procedures and treating other patients.  Critical care was necessary to treat or prevent imminent or life-threatening deterioration.  Critical care was time spent personally by me (independent of midlevel providers or residents) on the following activities: development of treatment plan with patient and/or surrogate as well as nursing, discussions with consultants,  evaluation of patient's response to treatment, examination of patient, obtaining history from patient or surrogate, ordering and performing treatments and interventions, ordering and review of laboratory studies, ordering and review of radiographic studies, pulse oximetry and re-evaluation of patient's condition.  Arvilla Meres, MD  8:01 AM

## 2023-12-20 NOTE — Progress Notes (Signed)
Inpatient Rehab Admissions Coordinator:   Per therapy recommendations,  patient was screened for CIR candidacy by Megan Salon, MS, CCC-SLP.   At this time, Pt. is not medically ready for CIR, remains on CRRT. I will not pursue a rehab consult for this Pt. at this time, but CIR admissions team will follow and monitor for medical readiness and place consult order if Pt. appears to be an appropriate candidate. Please contact me with any questions.   Megan Salon, MS, CCC-SLP Rehab Admissions Coordinator  [phone] (celll) [phone] (office)

## 2023-12-20 NOTE — Progress Notes (Signed)
PHARMACY - ANTICOAGULATION CONSULT NOTE  Pharmacy Consult for IV heparin transition to warfarin Indication: atrial fibrillation  No Known Allergies  Patient Measurements: Height: 5\' 3"  (160 cm) Weight: 73.6 kg (162 lb 4.1 oz) IBW/kg (Calculated) : 52.4 Heparin Dosing Weight: ~ 70 kg  Vital Signs: Temp: 98.4 F (36.9 C) (01/20 0400) Temp Source: Oral (01/20 0400) BP: 77/47 (01/20 0600) Pulse Rate: 84 (01/20 0600)  Labs: Recent Labs    12/18/23 0419 12/18/23 1502 12/19/23 0448 12/19/23 1522 12/20/23 0453  HGB 10.3*  --  9.4*  --  8.5*  HCT 33.1*  --  30.5*  --  28.0*  PLT 93*  --  88*  --  86*  APTT 61*  --  74*  --  74*  LABPROT 15.1  --  15.6*  --  15.9*  INR 1.2  --  1.2  --  1.3*  HEPARINUNFRC 0.37  --  0.39  --  0.31  CREATININE 1.20*   < > 1.10* 1.15* 1.26*   < > = values in this interval not displayed.    Estimated Creatinine Clearance: 38.8 mL/min (A) (by C-G formula based on SCr of 1.26 mg/dL (H)).  Warfarin doses received:    Assessment: 73 yo female s/p scheduled bioprosthetic mitral valve replacement with tissue valve 12/30 now with afib, pharmacy asked to dose anticoagulation with IV heparin. Spontaneously converted 1/7; flipped back to AF overnight 1/9 0300. She is noted on CRRT. Baseline INR 1.1, slow uptrend to 1.3 s/p 5 days warfarin therapy, total dose 15 mg thus far.  - INR subtherapeutic at 1.3 - Heparin level= 0.31, aPTT= 74. hg= 8.5, plt = 86 - Elevated Tbili (7.4 with trend down) may falsely lower heparin levels - the heparin level has been stable at goal on 950 units/hr since 12/15/22; aPTT low but I believe the heparin level is more reliable   Goal of Therapy:  Heparin level 0.3-0.5 units/ml aPTT 66-85 sec INR goal 2 - 3 (bMVR) Monitor platelets by anticoagulation protocol: Yes   Plan:  - Continue heparin at 950 units/hr - Warfarin 5 mg x1 today  - Daily heparin level, aPTT, CBC and INR  Rutherford Nail, PharmD PGY2 Critical Care  Pharmacy Resident  12/20/2023 6:29 AM   [facility] pharmacy phone numbers are listed on [email or web address]

## 2023-12-20 NOTE — Plan of Care (Signed)
  Problem: Education: Goal: Knowledge of General Education information will improve Description: Including pain rating scale, medication(s)/side effects and non-pharmacologic comfort measures Outcome: Progressing   Problem: Clinical Measurements: Goal: Respiratory complications will improve Outcome: Progressing Goal: Cardiovascular complication will be avoided Outcome: Progressing   Problem: Skin Integrity: Goal: Risk for impaired skin integrity will decrease Outcome: Progressing   Problem: Activity: Goal: Risk for activity intolerance will decrease Outcome: Progressing   Problem: Skin Integrity: Goal: Wound healing without signs and symptoms of infection Outcome: Progressing   Problem: Activity: Goal: Risk for activity intolerance will decrease Outcome: Progressing

## 2023-12-20 NOTE — Progress Notes (Signed)
[facility] NEPHROLOGY PROGRESS NOTE  Assessment/ Plan: Pt is a 73 y.o. yo female  with a past medical history HTN, NICM, CHF, pulmonary hypertension, mitral and tricuspid valve disease who present w/ surgery for valvular disease c/b shock, AHRF, and AKI   1)  Anuric AKI on CKD 3A: Multifactorial etiology including cardiorenal syndrome and ischemic ATN due to shock.  Started CRRT on 1/2 for fluid overload.  The CRRT was held on 1/12 because of worsening leukocytosis and poor access with plan to have line holiday. IHD on 1/13 with IDH and no UF. Resumed CRRT on 1/14 (new cath on 1/14) to manage volume Seen on CRRT, CVP only 5 and running even for now.  Very minimal urine output not unexpected; will hold CRRT if the filter clots or at the next filter change, evaluate where the patient is at.  But likely would need to restart 24 to 48 hours later; less likely to have recovery  if he is still on pressors.   2) #Multifactorial shock: Vasoplegic and cardioplegia catheter surgery.   - midodrine, milrinone, and norepi. Cont mgmt per primary team   3) Acute exacerbation of heart failure with reduced ejection fraction: Volume overload. Optimized with CRRT   4) Severe MR and tricuspid valvular disease:  - Status post repair.  CT surgery managing   5) Postoperative cardiac tamponade:  - Continue monitoring per cardiology   6)  Hyponatremia: improved with CRRT    7) Anemia: Related to blood loss and compounded by AKI and critical illness.    - Transfusions per primary team.   - aranesp 40 mcg weekly on Tuesdays   8)  Thrombocytopenia: switched from heparin to coumadin. stable   Subjective: Remains on CRRT. Near even. Norepi 11 and on milrinone. No complaints.   Objective Vital signs in last 24 hours: Vitals:   12/20/23 0545 12/20/23 0600 12/20/23 0623 12/20/23 0736  BP: (!) 77/50 (!) 77/47    Pulse: 85 84    Resp: 13 12    Temp:    98.6 F (37 C)  TempSrc:    Oral  SpO2:  98% 98%    Weight:   73.6 kg   Height:       Weight change: 0.3 kg  Intake/Output Summary (Last 24 hours) at 12/20/2023 0752 Last data filed at 12/20/2023 0700 Gross per 24 hour  Intake 2537 ml  Output 2167 ml  Net 370 ml       Labs: RENAL PANEL Recent Labs  Lab 12/16/23 0417 12/16/23 1601 12/17/23 0419 12/17/23 1009 12/18/23 0419 12/18/23 1502 12/19/23 0448 12/19/23 1522 12/20/23 0453  NA  --    < > 134*   < > 131* 133* 132* 133* 133*  K  --    < > 4.8   < > 5.0 4.9 4.7 4.5 4.4  CL  --    < > 98   < > 97* 99 98 100 100  CO2  --    < > 24   < > 24 25 25 23 27   GLUCOSE  --    < > 152*   < > 175* 137* 173* 157* 157*  BUN  --    < > 21   < > 23 19 19 15 16   CREATININE  --    < > 1.14*   < > 1.20* 1.27* 1.10* 1.15* 1.26*  CALCIUM  --    < > 9.1   < > 8.5* 8.4*  8.4* 8.1* 8.3*  MG 2.6*  --  2.7*  --  2.8*  --  2.7*  --  2.7*  PHOS  --    < > 2.8   < > 3.2 2.7 3.0 2.4* 2.6  ALBUMIN 2.6*   < > 2.9*   < > 2.8* 2.7* 2.6* 2.5* 2.5*   < > = values in this interval not displayed.    Liver Function Tests: Recent Labs  Lab 12/16/23 0417 12/16/23 1601 12/19/23 0448 12/19/23 1522 12/20/23 0453  AST 35  --   --   --   --   ALT 45*  --   --   --   --   ALKPHOS 64  --   --   --   --   BILITOT 7.4*  --   --   --   --   PROT 7.0  --   --   --   --   ALBUMIN 2.6*   < > 2.6* 2.5* 2.5*   < > = values in this interval not displayed.   No results for input(s): "LIPASE", "AMYLASE" in the last 168 hours. No results for input(s): "AMMONIA" in the last 168 hours.  CBC: Recent Labs    12/17/23 0419 12/17/23 1009 12/18/23 0419 12/19/23 0448 12/20/23 0453  HGB 10.6* 12.6  12.2 10.3* 9.4* 8.5*  MCV 100.6*  --  103.1* 103.0* 106.5*    Cardiac Enzymes: No results for input(s): "CKTOTAL", "CKMB", "CKMBINDEX", "TROPONINI" in the last 168 hours. CBG: Recent Labs  Lab 12/19/23 1603 12/19/23 1941 12/20/23 0008 12/20/23 0451 12/20/23 0734  GLUCAP 134* 114* 134* 153* 110*     Iron Studies: No results for input(s): "IRON", "TIBC", "TRANSFERRIN", "FERRITIN" in the last 72 hours. Studies/Results: No results found.    Medications: Infusions:   prismasol BGK 4/2.5 400 mL/hr at 12/19/23 2004    prismasol BGK 4/2.5 400 mL/hr at 12/19/23 2004   sodium chloride 10 mL/hr at 12/18/23 1416   anticoagulant sodium citrate     heparin 950 Units/hr (12/20/23 0700)   meropenem (MERREM) IV Stopped (12/19/23 2141)   norepinephrine (LEVOPHED) Adult infusion 11 mcg/min (12/20/23 0700)   prismasol BGK 4/2.5 1,500 mL/hr at 12/20/23 0750    Scheduled Medications:  amiodarone  200 mg Oral BID   ascorbic acid  250 mg Per Tube BID   Chlorhexidine Gluconate Cloth  6 each Topical Daily   darbepoetin (ARANESP) injection - NON-DIALYSIS  40 mcg Subcutaneous Q Tue-1800   feeding supplement (VITAL AF 1.2 CAL)  660 mL Per Tube Q24H   fiber supplement (BANATROL TF)  60 mL Per Tube QID   Gerhardt's butt cream   Topical BID   influenza vaccine adjuvanted  0.5 mL Intramuscular Tomorrow-1000   insulin aspart  0-24 Units Subcutaneous Q4H   melatonin  3 mg Oral Q24H   midodrine  15 mg Per Tube Q8H   multivitamin  1 tablet Per Tube BID   QUEtiapine  25 mg Oral QHS   sertraline  25 mg Oral Daily   Warfarin - Pharmacist Dosing Inpatient   Does not apply q1600   zinc sulfate (50mg  elemental zinc)  220 mg Per Tube Daily    have reviewed scheduled and prn medications.  Physical Exam: General:NAD, comfortable Heart:normal rate, no rub Lungs:  Bilateral chest rise, no iwob Abdomen:soft, Non-tender, non-distended Extremities: Peripheral  edema trace, cool lower extremities  Dialysis Access: .  Right IJ temporary HD catheter was replaced by PCCM  on 1/14. Keyion Knack W 12/20/2023,7:52 AM  LOS: 21 days

## 2023-12-20 NOTE — Progress Notes (Signed)
Calorie Count Results Note  48 hour calorie count ordered.  Diet: 2 gm Sodium  Supplements: Mighty Shake - BID, Magic Cup - BID  Estimated Nutritional Needs:  Kcal:  1500-1700 kcals Protein:  90-115 grams Fluid:  1.5 L  Day 1 - 12/18/23 Breakfast: 0 calorie, 0 gm protein  Lunch: per EMR, pt ate 10%; unsure of what Dinner: 0 calorie, 0 gm protein Supplements: None per pt  Total intake: unable to calculate  Day 2 - 12/19/23 Breakfast: no documentation Lunch: no documentation Dinner: no documentation Snacks: 105 calories, 1.5 gm protein Supplements: none per pt   Total intake: 105 kcal (7% of minimum estimated needs)  1.5 protein (2% of minimum estimated needs)  Nutrition Diagnosis: Inadequate oral intake related to acute illness as evidenced by NPO status.  Goal: Patient will meet greater than or equal to 90% of their needs  Intervention:  Continue nocturnal Tube Feeds via Cortrak:  Vital AF 1.2 at 55 x 12 hours  Pro-Source TF 20 BID  Banatrol TF - QID Magic Cup - BID Mighty Shakes - BID   Kirby Crigler RD, LDN Clinical Dietitian See Loretha Stapler for contact information.

## 2023-12-20 NOTE — Progress Notes (Signed)
[address]       [place] [address]             [phone]      3 Days Post-Op  Procedure(s) (LRB): RIGHT HEART CATH (N/A)   Total Length of Stay:  LOS: 21 days    SUBJECTIVE: Overall no progress on inotropes over weekend  Vitals:   12/20/23 0545 12/20/23 0600  BP: (!) 77/50 (!) 77/47  Pulse: 85 84  Resp: 13 12  Temp:    SpO2: 98% 98%    Intake/Output      01/19 0701 01/20 0700 01/20 0701 01/21 0700   P.O. 480    I.V. (mL/kg) 507.7 (6.9)    Other 60    NG/GT 1249.3    IV Piggyback 240    Total Intake(mL/kg) 2537 (34.5)    Stool 390    CRRT 1777    Total Output 2167    Net +370              prismasol BGK 4/2.5 400 mL/hr at 12/19/23 2004    prismasol BGK 4/2.5 400 mL/hr at 12/19/23 2004   sodium chloride 10 mL/hr at 12/18/23 1416   anticoagulant sodium citrate     heparin 950 Units/hr (12/20/23 0700)   meropenem (MERREM) IV Stopped (12/19/23 2141)   milrinone 0.125 mcg/kg/min (12/20/23 0700)   norepinephrine (LEVOPHED) Adult infusion 11 mcg/min (12/20/23 0700)   prismasol BGK 4/2.5 1,500 mL/hr at 12/20/23 0512    CBC    Component Value Date/Time   WBC 18.8 (H) 12/20/2023 0453   RBC 2.63 (L) 12/20/2023 0453   HGB 8.5 (L) 12/20/2023 0453   HCT 28.0 (L) 12/20/2023 0453   PLT 86 (L) 12/20/2023 0453   MCV 106.5 (H) 12/20/2023 0453   MCH 32.3 12/20/2023 0453   MCHC 30.4 12/20/2023 0453   RDW 24.1 (H) 12/20/2023 0453   LYMPHSABS 2.2 12/09/2023 1001   MONOABS 0.6 12/09/2023 1001   EOSABS 0.0 12/09/2023 1001   BASOSABS 0.2 (H) 12/09/2023 1001   CMP     Component Value Date/Time   NA 133 (L) 12/20/2023 0453   K 4.4 12/20/2023 0453   CL 100 12/20/2023 0453   CO2 27 12/20/2023 0453   GLUCOSE 157 (H) 12/20/2023 0453   BUN 16 12/20/2023 0453   CREATININE 1.26 (H) 12/20/2023 0453   CREATININE 1.30 (H) 04/29/2016 1144   CALCIUM 8.3 (L) 12/20/2023 0453   PROT 7.0 12/16/2023 0417   ALBUMIN 2.5 (L) 12/20/2023 0453   AST 35  12/16/2023 0417   ALT 45 (H) 12/16/2023 0417   ALKPHOS 64 12/16/2023 0417   BILITOT 7.4 (H) 12/16/2023 0417   GFRNONAA 45 (L) 12/20/2023 0453   GFRAA 42 (L) 08/19/2020 1304   ABG    Component Value Date/Time   PHART 7.414 12/05/2023 0948   PCO2ART 37.0 12/05/2023 0948   PO2ART 124 (H) 12/05/2023 0948   HCO3 27.4 12/17/2023 1009   HCO3 28.6 (H) 12/17/2023 1009   TCO2 29 12/17/2023 1009   TCO2 30 12/17/2023 1009   ACIDBASEDEF 1.0 12/05/2023 0948   O2SAT 81.2 12/20/2023 0453   CBG (last 3)  Recent Labs    12/19/23 1941 12/20/23 0008 12/20/23 0451  GLUCAP 114* 134* 153*  EXAM Depressed Lungs: clear Card: RR Ext: warm   ASSESSMENT: SP MV replacement and TV repair Hemodynamics continue to require levophed. On milrinone with COOX good. Would see to stop milrinone and push to  get off Levo   On midodrine Back in NSR WBC slightly higher. On antibiotics. No source determined CRRT conitinue PLT count low Started coumadin Continue tube feeds OOB to chair today    Eugenio Hoes, MD 12/20/2023

## 2023-12-21 ENCOUNTER — Other Ambulatory Visit (HOSPITAL_COMMUNITY): Payer: Self-pay

## 2023-12-21 DIAGNOSIS — I480 Paroxysmal atrial fibrillation: Secondary | ICD-10-CM | POA: Diagnosis not present

## 2023-12-21 DIAGNOSIS — R570 Cardiogenic shock: Secondary | ICD-10-CM | POA: Diagnosis not present

## 2023-12-21 DIAGNOSIS — A419 Sepsis, unspecified organism: Secondary | ICD-10-CM | POA: Diagnosis not present

## 2023-12-21 DIAGNOSIS — Z952 Presence of prosthetic heart valve: Secondary | ICD-10-CM | POA: Diagnosis not present

## 2023-12-21 DIAGNOSIS — I340 Nonrheumatic mitral (valve) insufficiency: Secondary | ICD-10-CM | POA: Diagnosis not present

## 2023-12-21 LAB — RENAL FUNCTION PANEL
Albumin: 2.3 g/dL — ABNORMAL LOW (ref 3.5–5.0)
Albumin: 2.5 g/dL — ABNORMAL LOW (ref 3.5–5.0)
Anion gap: 10 (ref 5–15)
Anion gap: 12 (ref 5–15)
BUN: 34 mg/dL — ABNORMAL HIGH (ref 8–23)
BUN: 41 mg/dL — ABNORMAL HIGH (ref 8–23)
CO2: 24 mmol/L (ref 22–32)
CO2: 20 mmol/L — ABNORMAL LOW (ref 22–32)
Calcium: 8.6 mg/dL — ABNORMAL LOW (ref 8.9–10.3)
Calcium: 8.8 mg/dL — ABNORMAL LOW (ref 8.9–10.3)
Chloride: 97 mmol/L — ABNORMAL LOW (ref 98–111)
Chloride: 96 mmol/L — ABNORMAL LOW (ref 98–111)
Creatinine, Ser: 2.77 mg/dL — ABNORMAL HIGH (ref 0.44–1.00)
Creatinine, Ser: 3.4 mg/dL — ABNORMAL HIGH (ref 0.44–1.00)
GFR, Estimated: 18 mL/min — ABNORMAL LOW (ref >60)
GFR, Estimated: 14 mL/min — ABNORMAL LOW (ref >60)
Glucose, Bld: 142 mg/dL — ABNORMAL HIGH (ref 70–99)
Glucose, Bld: 127 mg/dL — ABNORMAL HIGH (ref 70–99)
Phosphorus: 5 mg/dL — ABNORMAL HIGH (ref 2.5–4.6)
Phosphorus: 6 mg/dL — ABNORMAL HIGH (ref 2.5–4.6)
Potassium: 4.7 mmol/L (ref 3.5–5.1)
Potassium: 5.2 mmol/L — ABNORMAL HIGH (ref 3.5–5.1)
Sodium: 131 mmol/L — ABNORMAL LOW (ref 135–145)
Sodium: 128 mmol/L — ABNORMAL LOW (ref 135–145)

## 2023-12-21 LAB — GLUCOSE, CAPILLARY
Glucose-Capillary: 103 mg/dL — ABNORMAL HIGH (ref 70–99)
Glucose-Capillary: 128 mg/dL — ABNORMAL HIGH (ref 70–99)
Glucose-Capillary: 131 mg/dL — ABNORMAL HIGH (ref 70–99)
Glucose-Capillary: 140 mg/dL — ABNORMAL HIGH (ref 70–99)
Glucose-Capillary: 144 mg/dL — ABNORMAL HIGH (ref 70–99)
Glucose-Capillary: 151 mg/dL — ABNORMAL HIGH (ref 70–99)
Glucose-Capillary: 88 mg/dL (ref 70–99)

## 2023-12-21 LAB — MAGNESIUM: Magnesium: 2.6 mg/dL — ABNORMAL HIGH (ref 1.7–2.4)

## 2023-12-21 LAB — CBC
HCT: 26.9 % — ABNORMAL LOW (ref 36.0–46.0)
Hemoglobin: 8.2 g/dL — ABNORMAL LOW (ref 12.0–15.0)
MCH: 32.3 pg (ref 26.0–34.0)
MCHC: 30.5 g/dL (ref 30.0–36.0)
MCV: 105.9 fL — ABNORMAL HIGH (ref 80.0–100.0)
Platelets: 82 10*3/uL — ABNORMAL LOW (ref 150–400)
RBC: 2.54 MIL/uL — ABNORMAL LOW (ref 3.87–5.11)
RDW: 24.7 % — ABNORMAL HIGH (ref 11.5–15.5)
WBC: 17.5 10*3/uL — ABNORMAL HIGH (ref 4.0–10.5)
nRBC: 1.5 % — ABNORMAL HIGH (ref 0.0–0.2)

## 2023-12-21 LAB — COOXEMETRY PANEL
Carboxyhemoglobin: 2.5 % — ABNORMAL HIGH (ref 0.5–1.5)
Methemoglobin: <0.7 % (ref 0.0–1.5)
O2 Saturation: 71.4 %
Total hemoglobin: 8.5 g/dL — ABNORMAL LOW (ref 12.0–16.0)

## 2023-12-21 LAB — PROTIME-INR
INR: 1.3 — ABNORMAL HIGH (ref 0.8–1.2)
Prothrombin Time: 16 s — ABNORMAL HIGH (ref 11.4–15.2)

## 2023-12-21 LAB — APTT: aPTT: 87 s — ABNORMAL HIGH (ref 24–36)

## 2023-12-21 LAB — HEPARIN LEVEL (UNFRACTIONATED): Heparin Unfractionated: 0.28 [IU]/mL — ABNORMAL LOW (ref 0.30–0.70)

## 2023-12-21 MED ORDER — MIDODRINE HCL 5 MG PO TABS
20.0000 mg | ORAL_TABLET | Freq: Three times a day (TID) | ORAL | Status: DC
Start: 2023-12-21 — End: 2023-12-29
  Administered 2023-12-21 – 2023-12-29 (×24): 20 mg
  Filled 2023-12-21 (×26): qty 4

## 2023-12-21 MED ORDER — MELATONIN 3 MG PO TABS
3.0000 mg | ORAL_TABLET | Freq: Every evening | ORAL | Status: DC | PRN
  Administered 2023-12-21: 3 mg via ORAL
  Filled 2023-12-21: qty 1

## 2023-12-21 MED ORDER — DROXIDOPA 100 MG PO CAPS
200.0000 mg | ORAL_CAPSULE | Freq: Three times a day (TID) | ORAL | Status: DC
  Administered 2023-12-21 – 2023-12-28 (×21): 200 mg
  Filled 2023-12-21 (×23): qty 2

## 2023-12-21 MED ORDER — TRAZODONE HCL 50 MG PO TABS
100.0000 mg | ORAL_TABLET | Freq: Every evening | ORAL | Status: DC | PRN
  Administered 2023-12-23: 100 mg
  Filled 2023-12-21: qty 2

## 2023-12-21 NOTE — Progress Notes (Signed)
NAME:  Vanessa [name], MRN:  [id number], DOB:  May 25, 1951, LOS: 22 ADMISSION DATE:  11/29/2023, CONSULTATION DATE:  11/29/23 REFERRING MD:  Dr. Leafy Ro, CHIEF COMPLAINT:  postop   History of Present Illness:  72yoF with PMH significant for HTN, NICM, HFrEF (EF40-45%), PHTN, CKD and hx of prior severe MR s/p prior mitral clip x 2 in 2020.  Pt with progressive DOE found to have increasing MR  and moderate TR despite adjustments in GDMT.  Not felt to be candidate for additional mitral clip.  Repeat LHC without CAD but showed moderate PHTN.  Underwent mitral valve replacement with tissue valve and tricuspid valve repair by Dr. Leafy Ro on 12/30.  PCCM consulted to assist with vent and medical management post-operatively in ICU.   Pertinent  Medical History  Never smoker, MR w/ previous Mitral clip x 2 (2020), NICM, HFrEF, PHTN, HTN, CKD3b  Significant Hospital Events: Including procedures, antibiotic start and stop dates in addition to other pertinent events   MVR by Dr. Leafy Ro; relook for tamponade/washout later in evening without obvious source 1/2 intubated for respiratory decompensation, iNO started. Vanc, meropenem started. CRRT started.  1/5 extubated 1/8 converted to NSR overnight 1/9 back to A.fib around 0200 1/11 R introducer out.  Unable to thread right subclavian catheter 1/12 remains weak still pressor dependent 1/13 repeat [facility] sent. Still on pressors..,  Zosyn changed to meropenem.  Chest abdomen and pelvis CT obtained : Moderate hemorrhagic pericardial effusion, 5.3 cm hematoma right thrombus. Arterial gas bilateral patchy opacities mild dependent posterior right upper lobe atelectasis was unremarkable layering gallstones  1/14 new right IJ HD catheter placed.  The echocardiogram Was obtained to better evaluate her EF is 35 to 40%.  The LV demonstrates regional wall motion abnormalities there is asymmetric left ventricular hypertrophy of the inferior lateral segment RV systolic  function moderate regular reduced left atrial wall dilated no mention of clot.  1/15 still on fairly high-dose norepinephrine 1/16 cont CRRT + low dose NE  1/17 RHC confirms low output state, restarted on milrinone 1/21: dc milrinone yesterday, ongoing increase NE requirements during nights. Started droxidopa in addition to midodrine to aid off NE.  Interim History / Subjective:  Continues to need more pressors at night less during day.    prismasol BGK 4/2.5 Stopped (12/20/23 1015)    prismasol BGK 4/2.5 Stopped (12/20/23 1015)   sodium chloride 10 mL/hr at 12/18/23 1416   anticoagulant sodium citrate     heparin 950 Units/hr (12/21/23 0600)   meropenem (MERREM) IV Stopped (12/20/23 2134)   norepinephrine (LEVOPHED) Adult infusion 11 mcg/min (12/21/23 0600)   prismasol BGK 4/2.5 Stopped (12/20/23 1015)     Objective   Blood pressure (!) 76/54, pulse 73, temperature 98.1 F (36.7 C), temperature source Oral, resp. rate 20, height 5\' 3"  (1.6 m), weight 73.6 kg, SpO2 91%. CVP:  [4 mmHg-7 mmHg] 6 mmHg      Intake/Output Summary (Last 24 hours) at 12/21/2023 0657 Last data filed at 12/21/2023 0630 Gross per 24 hour  Intake 2329.52 ml  Output 422.7 ml  Net 1906.82 ml   Filed Weights   12/18/23 0634 12/19/23 0645 12/20/23 0623  Weight: 72.4 kg 73.3 kg 73.6 kg   Examination:  General: older female, acute on chronically ill appearing, OOB in chair, no acute distress  HEENT: [place]/at, perrla, no jvd, hd line rij  Pulm: diminished LLL lung field, no wheezing, rhonchi, on RA with sat 90%, mild increased wob  Cardiac: rrr, no rub,  gallop  Abdomen: rounded, soft, non-tender Extremities: no edema  Neuro: globally weak, rass 0, oriented, non focal  Resolved Hospital Problem list :   Postoperative vent management Postop tamponade resolved after relook and washout on day 1 Assessment & Plan:   Sev Mitral regurg s/p prior mitral clip, now s/p MVR Mod tricuspid regurg s/p TV  repair Persistent cardioplegia postop- confirmed on RHC pHTN AoC HFrEF NICM pAF, flutter  S/p MVR and tricuspid ring 12/30 w/ Weldner. Back to OR for tamponade. Decompensated requiring intubation for FVO, renal failure, pHTN. 1/17 RHC with low filling pressures and started on milrinone, subsequently off. Con't to have vasopressor requirements (increased at night). Coox stable at 71.4.  - con't NE for MAP >65, consider changing MAP goal  - con't midodrine 15mg  TID  - add droxidopa 200mg  TID  - con't heparin gtt for afib  - con't amiodarone 200 BID per AHF  - now on warfarin, monitor INR - IS (only getting 250cc volumes)  - OOB to chair as able   ?Sepsis unclear source  Thrombocytopenia stable Had pan scan 1/13 with patchy lower lobe opacities, no AP findings. White count is elevated but flat over 3 days. No fever past 24hrs. No definite source. Lines were removed and replaced last week. Initially on linezolid/mero - con't meropenem - stop 1/25 - consider rescan if begins to fever, clinical decomp - trend fever, wbc curve  Acute renal failure on CKD III 1/14 CRRT started, stopped 1/20. CVP 1/21 AM 6, goal 3-5 per HF  - needs eventual iHD but remains on NE  - restart CRRT per nephro if she remains on NE - nephro following, appreciate input    Protein calorie malnutrition Physical deconditioning  Deep tissue injury on left buttocks approximately 2 cm in diameter purple discoloration - encourage PO intake, she has no appetite - consider remeron  - mtvm,  - feeding supplement  - PT/OT  - OOB as able   Situational depression Sleep disturbance - melatonin PRN  - seroquel 25mg  daily  - zoloft 25mg  daily  - trazodone 100mg  PRN  Best Practice (right click and "Reselect all SmartList Selections" daily)   Diet/type: Encourage PO, no appetite, supplement TF DVT prophylaxis systemic heparin --> warfarin  Pressure ulcer(s): L buttocks  GI prophylaxis: N/A Lines: HD cath  Foley:   N/A Code Status:  full code Last date of multidisciplinary goals of care discussion [per primary]  CC time: 33 minutes Cristopher Peru, PA-C [facility] 12/21/23 7:18 AM  Please see [email or web address] for pager details.  From 7A-7P if no response, please call [phone] After hours, please call ELink [phone]

## 2023-12-21 NOTE — Progress Notes (Signed)
Occupational Therapy Treatment Patient Details Name: Vanessa [name] MRN: [id number] DOB: 03-24-1951 Today's Date: 12/21/2023   History of present illness 73 yo F admitted 12/30 with DOE, increasing MR and TR. [facility] without CAD. 12/30 MVR and tricuspid valve repair. Intubated 1/2-1/5. 1/2 CRRT started/stopped on 1/12. Pt HD line was removed on 1/13 and CRRT was re-started on 1/14-1/20.  PMhx: HTN, NICM, HFrEF (EF40-45%), Pulmonary HTN, CKD and  severe MR s/p prior mitral clip x 2 in 2020.   OT comments  Pt now off CRRT and RN reporting pt walked from chair around to other side of bed this morning. Pt seen this afternoon with RN present for safety/lines. Pt needing encouragement for OOB initially but then agreeable. Overall depressed mood; have recommended spiritual care consult. Pt with greater adherence to sternal precautions this session. Pt needing up to mod A for bed mobility and min A +2 for transfers this session. Limited by dizziness and orthostatic hypotension in which RN was agreeable to return pt to supine. Will continue to follow.   sitting 92/54 (64), standing 77/43 (54), back in bed resting 93/56 (63)       If plan is discharge home, recommend the following:  Two people to help with walking and/or transfers;Two people to help with bathing/dressing/bathroom;Assistance with cooking/housework;Assist for transportation;Help with stairs or ramp for entrance   Equipment Recommendations  Other (comment) (defer)    Recommendations for Other Services Other (comment) (spiritual care)    Precautions / Restrictions Precautions Precautions: Sternal;Fall;Other (comment) Precaution Comments: cortrak, watch vitals Restrictions Weight Bearing Restrictions Per Provider Order: Yes Other Position/Activity Restrictions: sternal precautions       Mobility Bed Mobility Overal bed mobility: Needs Assistance Bed Mobility: Supine to Sit, Sit to Supine     Supine to sit: Mod assist, HOB  elevated   Sit to sidelying: Mod assist General bed mobility comments: verbal cues for sequencing but good adherence to sternal precautions with pt holding pillow throughout    Transfers Overall transfer level: Needs assistance Equipment used: Ambulation equipment used Transfers: Sit to/from Stand Sit to Stand: Min assist, +2 physical assistance           General transfer comment: Pt standing with light assist to rise and cues for hand placement. Seems to have greater awareness of sternal precautions and role of precautions in facilitation of healing. Pt with dizziness on standing needing up to min A to maintain standing     Balance Overall balance assessment: Needs assistance Sitting-balance support: No upper extremity supported, Feet supported, Feet unsupported Sitting balance-Leahy Scale: Fair     Standing balance support: Bilateral upper extremity supported, Reliant on assistive device for balance Standing balance-Leahy Scale: Poor Standing balance comment: EVA in standing initially but external support with onset dizziness.                           ADL either performed or assessed with clinical judgement   ADL Overall ADL's : Needs assistance/impaired     Grooming: Set up;Sitting Grooming Details (indicate cue type and reason): Initially agreeable for ambulation to sink with encouragement but limited by low BPs                 Toilet Transfer: Minimal assistance;+2 for physical assistance;+2 for safety/equipment Toilet Transfer Details (indicate cue type and reason): STS up to EVA and marched in place this session; limited by significant dizziness.  Functional mobility during ADLs: Minimal assistance (EVA; STS and marching in place this session)      Extremity/Trunk Assessment Upper Extremity Assessment Upper Extremity Assessment: Right hand dominant;Generalized weakness   Lower Extremity Assessment Lower Extremity Assessment: Defer to  PT evaluation        Vision   Vision Assessment?: No apparent visual deficits   Perception Perception Perception: Not tested   Praxis Praxis Praxis: Not tested    Cognition Arousal: Alert Behavior During Therapy: WFL for tasks assessed/performed, Flat affect Overall Cognitive Status: Within Functional Limits for tasks assessed                                 General Comments: continues to require encouragement for OOB mobility, with depressed spirits continues to state she did not sign up for this, however, once session initiated several smiles throughout. Responds positvely to acknowledgement of progress and motivation        Exercises      Shoulder Instructions       General Comments sitting 92/54 (64), standing 77/43 (54),  back in bed resting 93/56 (63)    Pertinent Vitals/ Pain       Pain Assessment Pain Assessment: No/denies pain  Home Living                                          Prior Functioning/Environment              Frequency  Min 1X/week        Progress Toward Goals  OT Goals(current goals can now be found in the care plan section)  Progress towards OT goals: Progressing toward goals  Acute Rehab OT Goals Patient Stated Goal: get out of here OT Goal Formulation: With patient Time For Goal Achievement: 01/11/24 Potential to Achieve Goals: Good ADL Goals Pt Will Perform Grooming: with contact guard assist;sitting Pt Will Perform Upper Body Dressing: with set-up;sitting Pt Will Perform Lower Body Dressing: with min assist;sit to/from stand Pt Will Transfer to Toilet: with min assist;ambulating Additional ADL Goal #1: Pt will identify and implement 2+ energy conservation techniques Additional ADL Goal #2: Pt will recall and implement sternal precautions during ADL  Plan      Co-evaluation                 AM-PAC OT "6 Clicks" Daily Activity     Outcome Measure   Help from another person  eating meals?: A Little Help from another person taking care of personal grooming?: A Little Help from another person toileting, which includes using toliet, bedpan, or urinal?: A Lot Help from another person bathing (including washing, rinsing, drying)?: A Lot Help from another person to put on and taking off regular upper body clothing?: A Lot Help from another person to put on and taking off regular lower body clothing?: A Lot 6 Click Score: 14    End of Session Equipment Utilized During Treatment: Oxygen;Other (comment) (EVA)  OT Visit Diagnosis: Muscle weakness (generalized) (M62.81);Other (comment) (decreased activity toelrance)   Activity Tolerance Other (comment) (limited by orthostatics/dizziness)   Patient Left in bed;with call bell/phone within reach;with bed alarm set   Nurse Communication Mobility status        Time: 1331-1401 OT Time Calculation (min): 30 min  Charges: OT General Charges $OT Visit: 1  Visit OT Treatments $Self Care/Home Management : 8-22 mins $Therapeutic Activity: 8-22 mins  Tyler Deis, OTR/L [facility] Acute Rehabilitation Office: [phone]   Myrla Halsted 12/21/2023, 4:13 PM

## 2023-12-21 NOTE — Progress Notes (Signed)
[facility] NEPHROLOGY PROGRESS NOTE  Assessment/ Plan: Pt is a 73 y.o. yo female  with a past medical history HTN, NICM, CHF, pulmonary hypertension, mitral and tricuspid valve disease who present w/ surgery for valvular disease c/b shock, AHRF, and AKI   1)  Anuric AKI on CKD 3A: Multifactorial etiology including cardiorenal syndrome and ischemic ATN due to shock.  Started CRRT on 1/2 for fluid overload.  The CRRT was held on 1/12 because of worsening leukocytosis and poor access with plan to have line holiday. IHD on 1/13 with IDH and no UF. Resumed CRRT on 1/14-1/20 (new cath on 1/14) to manage volume CVP only 5 and had been running even for it was stopped .  Still minimal urine output.   Will give another 24-48 hours but likely will need to restart tomorrow.    2) #Multifactorial shock: Vasoplegic and cardioplegia catheter surgery.   - midodrine incr to 20mg  TID, milrinone off on 1/20, droxidopa trial and norepi currently at 8. Cont mgmt per primary team   3) Acute exacerbation of heart failure with reduced ejection fraction: Volume overload. Optimized with CRRT   4) Severe MR and tricuspid valvular disease:  - Status post repair.  CT surgery managing   5) Postoperative cardiac tamponade:  - Continue monitoring per cardiology   6)  Hyponatremia: improved with CRRT    7) Anemia: Related to blood loss and compounded by AKI and critical illness.    - Transfusions per primary team.   - aranesp 40 mcg weekly on Tuesdays   8)  Thrombocytopenia: switched from heparin to coumadin.    Subjective: Off CRRT on 1/20 (had been near even). Norepi 10 this AM -> currently 8  and off  milrinone on 1/20. No complaints.   Objective Vital signs in last 24 hours: Vitals:   12/21/23 0815 12/21/23 0835 12/21/23 0845 12/21/23 0900  BP: (!) 90/55 106/62 104/61 108/60  Pulse: 71 78 76 74  Resp: 20 18 (!) 22 (!) 23  Temp: 98.3 F (36.8 C)     TempSrc: Oral     SpO2: 91% 93% 97%  90%  Weight:      Height:       Weight change:   Intake/Output Summary (Last 24 hours) at 12/21/2023 1020 Last data filed at 12/21/2023 0900 Gross per 24 hour  Intake 1719.65 ml  Output 275 ml  Net 1444.65 ml       Labs: RENAL PANEL Recent Labs  Lab 12/17/23 0419 12/17/23 1009 12/18/23 0419 12/18/23 1502 12/19/23 0448 12/19/23 1522 12/20/23 0453 12/20/23 1634 12/21/23 0457  NA 134*   < > 131*   < > 132* 133* 133* 132* 131*  K 4.8   < > 5.0   < > 4.7 4.5 4.4 4.4 4.7  CL 98   < > 97*   < > 98 100 100 100 97*  CO2 24   < > 24   < > 25 23 27 24 24   GLUCOSE 152*   < > 175*   < > 173* 157* 157* 149* 142*  BUN 21   < > 23   < > 19 15 16 19  34*  CREATININE 1.14*   < > 1.20*   < > 1.10* 1.15* 1.26* 1.61* 2.77*  CALCIUM 9.1   < > 8.5*   < > 8.4* 8.1* 8.3* 8.1* 8.6*  MG 2.7*  --  2.8*  --  2.7*  --  2.7*  --  2.6*  PHOS 2.8   < > 3.2   < > 3.0 2.4* 2.6 2.5 5.0*  ALBUMIN 2.9*   < > 2.8*   < > 2.6* 2.5* 2.5* 2.3* 2.3*   < > = values in this interval not displayed.    Liver Function Tests: Recent Labs  Lab 12/16/23 0417 12/16/23 1601 12/20/23 0453 12/20/23 1634 12/21/23 0457  AST 35  --   --   --   --   ALT 45*  --   --   --   --   ALKPHOS 64  --   --   --   --   BILITOT 7.4*  --   --   --   --   PROT 7.0  --   --   --   --   ALBUMIN 2.6*   < > 2.5* 2.3* 2.3*   < > = values in this interval not displayed.   No results for input(s): "LIPASE", "AMYLASE" in the last 168 hours. No results for input(s): "AMMONIA" in the last 168 hours.  CBC: Recent Labs    12/17/23 1009 12/18/23 0419 12/19/23 0448 12/20/23 0453 12/21/23 0459  HGB 12.6  12.2 10.3* 9.4* 8.5* 8.2*  MCV  --  103.1* 103.0* 106.5* 105.9*    Cardiac Enzymes: No results for input(s): "CKTOTAL", "CKMB", "CKMBINDEX", "TROPONINI" in the last 168 hours. CBG: Recent Labs  Lab 12/20/23 1716 12/20/23 2038 12/21/23 0020 12/21/23 0455 12/21/23 0823  GLUCAP 89 135* 140* 131* 128*    Iron Studies: No  results for input(s): "IRON", "TIBC", "TRANSFERRIN", "FERRITIN" in the last 72 hours. Studies/Results: DG Chest Port 1 View Result Date: 12/20/2023 CLINICAL DATA:  Atelectasis. EXAM: PORTABLE CHEST 1 VIEW COMPARISON:  12/17/2023 FINDINGS: Again noted is enlargement of the cardiac silhouette. Increased densities at the left lung base could represent more consolidation or atelectasis. Difficult to exclude left pleural fluid. Right lung remains clear. Feeding tube extends down the esophagus and into the stomach but the tip is beyond the image. Stable position of the right jugular dialysis catheter with the tip near the superior cavoatrial junction. Post cardiac surgery changes are noted. IMPRESSION: 1. Slightly increased densities at the left lung base. Findings could represent atelectasis or consolidation. 2. Stable enlargement of the cardiac silhouette. 3. Stable support apparatus. Electronically Signed   By: Richarda Overlie M.D.   On: 12/20/2023 09:07      Medications: Infusions:   prismasol BGK 4/2.5 Stopped (12/20/23 1015)    prismasol BGK 4/2.5 Stopped (12/20/23 1015)   anticoagulant sodium citrate     heparin 950 Units/hr (12/21/23 0900)   meropenem (MERREM) IV 2 g (12/21/23 1016)   norepinephrine (LEVOPHED) Adult infusion 9 mcg/min (12/21/23 0900)   prismasol BGK 4/2.5 Stopped (12/20/23 1015)    Scheduled Medications:  amiodarone  200 mg Oral BID   ascorbic acid  250 mg Per Tube BID   Chlorhexidine Gluconate Cloth  6 each Topical Daily   darbepoetin (ARANESP) injection - NON-DIALYSIS  40 mcg Subcutaneous Q Tue-1800   droxidopa  200 mg Per Tube TID WC   feeding supplement (VITAL AF 1.2 CAL)  660 mL Per Tube Q24H   fiber supplement (BANATROL TF)  60 mL Per Tube QID   Gerhardt's butt cream   Topical BID   influenza vaccine adjuvanted  0.5 mL Intramuscular Tomorrow-1000   insulin aspart  0-24 Units Subcutaneous Q4H   midodrine  20 mg Per Tube Q8H   multivitamin  1  tablet Per Tube BID    QUEtiapine  25 mg Oral QHS   sertraline  25 mg Oral Daily   Warfarin - Pharmacist Dosing Inpatient   Does not apply q1600   zinc sulfate (50mg  elemental zinc)  220 mg Per Tube Daily    have reviewed scheduled and prn medications.  Physical Exam: General:NAD, comfortable Heart:normal rate, no rub Lungs:  Bilateral chest rise, no iwob Abdomen:soft, Non-tender, non-distended Extremities: Peripheral  edema trace, cool lower extremities  Dialysis Access: .  Right IJ temporary HD catheter was replaced by PCCM on 1/14. Odel Schmid W 12/21/2023,10:20 AM  LOS: 22 days

## 2023-12-21 NOTE — Progress Notes (Addendum)
Patient ID: Vanessa [name], female   DOB: 28-Oct-1951, 73 y.o.   MRN: [id number]  TCTS Evening Rounds:  Still requiring NE from 6 this am up to 11 this afternoon and midodrine 20 tid to support BP. Started on droxidopa today.  CRRT continues. CVP 6 today.  Sats 96%  Ambulated today. Still has poor appetite.

## 2023-12-21 NOTE — Progress Notes (Addendum)
PHARMACY - ANTICOAGULATION CONSULT NOTE  Pharmacy Consult for IV heparin Indication: atrial fibrillation  No Known Allergies  Patient Measurements: Height: 5\' 3"  (160 cm) Weight: 73.6 kg (162 lb 4.1 oz) IBW/kg (Calculated) : 52.4 Heparin Dosing Weight: ~ 70 kg  Vital Signs: Temp: 98.1 F (36.7 C) (01/21 0115) Temp Source: Oral (01/21 0115) BP: 105/55 (01/21 0530) Pulse Rate: 82 (01/21 0530)  Labs: Recent Labs    12/19/23 0448 12/19/23 1522 12/20/23 0453 12/20/23 1634 12/21/23 0457 12/21/23 0459  HGB 9.4*  --  8.5*  --   --  8.2*  HCT 30.5*  --  28.0*  --   --  26.9*  PLT 88*  --  86*  --   --  82*  APTT 74*  --  74*  --  87*  --   LABPROT 15.6*  --  15.9*  --  16.0*  --   INR 1.2  --  1.3*  --  1.3*  --   HEPARINUNFRC 0.39  --  0.31  --  0.28*  --   CREATININE 1.10*   < > 1.26* 1.61* 2.77*  --    < > = values in this interval not displayed.    Estimated Creatinine Clearance: 17.6 mL/min (A) (by C-G formula based on SCr of 2.77 mg/dL (H)).  Warfarin doses received (INR): 1/15 - 2.5 (1.1) 1/16 - 2.5 (1.1) 1/17 - 2.5 (1.2) 1/18 - 2.5 (1.2) 1/19 - 5 (1.3) 1/20 - 5 (1.3)  Assessment: 73 yo female s/p scheduled bioprosthetic mitral valve replacement with tissue valve 12/30 now with afib, pharmacy asked to dose anticoagulation with IV heparin. Spontaneously converted 1/7; flipped back to AF overnight 1/9 0300. She is noted on CRRT. Baseline INR 1.1, slow uptrend to 1.3 s/p 6 days warfarin therapy, total dose 20 mg thus far. CRRT held 1/20 at 1015 with filter change/clot, will need to evaluate dialysis plans.  - INR subtherapeutic at 1.3 - Heparin level = 0.28, aPTT= 87. Hg = 8.2, plt = 82 - Elevated Tbili (7.4 with trend down) may falsely lower heparin levels - aPTT slightly supratherapeutic, HL slightly subtherapeutic  Goal of Therapy:  Heparin level 0.3-0.5 units/ml aPTT 66-85 sec INR goal 2 - 3 (bMVR) Monitor platelets by anticoagulation protocol: Yes    Plan:  - Continue heparin at 950 units/hr, pt has been stable since 1/16 on current dose - Hold warfarin, f/u restart after permanent dialysis access established - Daily heparin level, CBC and PRN (will stop monitoring aPTT with downtrending Tbili and several days relatively stable)  Rutherford Nail, PharmD PGY2 Critical Care Pharmacy Resident  12/21/2023 6:30 AM   [facility] pharmacy phone numbers are listed on [email or web address]

## 2023-12-21 NOTE — Progress Notes (Signed)
[address]       [place] [address]             [phone]      4 Days Post-Op  Procedure(s) (LRB): RIGHT HEART CATH (N/A)   Total Length of Stay:  LOS: 22 days    SUBJECTIVE: No complaints  Vitals:   12/21/23 0638 12/21/23 0645  BP: (!) 62/33 (!) 76/54  Pulse: 74 73  Resp: (!) 23 20  Temp:    SpO2: 90% 91%    Intake/Output      01/20 0701 01/21 0700   P.O. 0   I.V. (mL/kg) 461.3 (6.3)   Other 60   NG/GT 978.8   IV Piggyback 780.1   Total Intake(mL/kg) 2280.2 (31)   Emesis/NG output 0   Stool 275   CRRT 54.7   Total Output 329.7   Net +1950.5            prismasol BGK 4/2.5 Stopped (12/20/23 1015)    prismasol BGK 4/2.5 Stopped (12/20/23 1015)   sodium chloride 10 mL/hr at 12/18/23 1416   anticoagulant sodium citrate     heparin 950 Units/hr (12/21/23 0600)   meropenem (MERREM) IV Stopped (12/20/23 2134)   norepinephrine (LEVOPHED) Adult infusion 11 mcg/min (12/21/23 0600)   prismasol BGK 4/2.5 Stopped (12/20/23 1015)    CBC    Component Value Date/Time   WBC 17.5 (H) 12/21/2023 0459   RBC 2.54 (L) 12/21/2023 0459   HGB 8.2 (L) 12/21/2023 0459   HCT 26.9 (L) 12/21/2023 0459   PLT 82 (L) 12/21/2023 0459   MCV 105.9 (H) 12/21/2023 0459   MCH 32.3 12/21/2023 0459   MCHC 30.5 12/21/2023 0459   RDW 24.7 (H) 12/21/2023 0459   LYMPHSABS 2.2 12/09/2023 1001   MONOABS 0.6 12/09/2023 1001   EOSABS 0.0 12/09/2023 1001   BASOSABS 0.2 (H) 12/09/2023 1001   CMP     Component Value Date/Time   NA 131 (L) 12/21/2023 0457   K 4.7 12/21/2023 0457   CL 97 (L) 12/21/2023 0457   CO2 24 12/21/2023 0457   GLUCOSE 142 (H) 12/21/2023 0457   BUN 34 (H) 12/21/2023 0457   CREATININE 2.77 (H) 12/21/2023 0457   CREATININE 1.30 (H) 04/29/2016 1144   CALCIUM 8.6 (L) 12/21/2023 0457   PROT 7.0 12/16/2023 0417   ALBUMIN 2.3 (L) 12/21/2023 0457   AST 35 12/16/2023 0417   ALT 45 (H) 12/16/2023 0417   ALKPHOS 64 12/16/2023 0417   BILITOT  7.4 (H) 12/16/2023 0417   GFRNONAA 18 (L) 12/21/2023 0457   GFRAA 42 (L) 08/19/2020 1304   ABG    Component Value Date/Time   PHART 7.414 12/05/2023 0948   PCO2ART 37.0 12/05/2023 0948   PO2ART 124 (H) 12/05/2023 0948   HCO3 27.4 12/17/2023 1009   HCO3 28.6 (H) 12/17/2023 1009   TCO2 29 12/17/2023 1009   TCO2 30 12/17/2023 1009   ACIDBASEDEF 1.0 12/05/2023 0948   O2SAT 71.4 12/21/2023 0457   CBG (last 3)  Recent Labs    12/20/23 2038 12/21/23 0020 12/21/23 0455  GLUCAP 135* 140* 131*  EXAM Peripherally warm Lungs;clear Neuro intact   ASSESSMENT: SP MV replacement and TV repair No change in medical support of BP. May need to consider cuff bp not measuring accurately with pt feeling well CRRT as per renal Cont PT and ambulation Antibx as per CCM Eating better.    Eugenio Hoes, MD 12/21/2023

## 2023-12-21 NOTE — Progress Notes (Addendum)
Patient ID: Vanessa [name], female   DOB: 1951-11-09, 73 y.o.   MRN: [id number]      Advanced Heart Failure Rounding Note  Cardiologist: Dr. Shirlee Latch  Chief Complaint: Cardiogenic Shock  Subjective:   12/30 S/P MVR (tissue) and TV ring and same day return to OR for tamponade/bleeding. 1/2: Progressive volume overload with poor UOP, CVVH started but ended up re-intubated.   Extubated 12/05/23 1/12: CVVH stopped 1/13: iHD, HD line removed 1/14: CRRT Restarted.  1/17: RHC mean RA 2, PA 47/20, mean PCWP 5, CI 1.74, PVR 8, PAPi 9.  Milrinone 0.125 started. 1/20 CRRT stopped.    On Norepi 11 mcg + midodrine 15 mg tid.   Complaining of fatigue.  Denies SOB.      Objective:   Weight Range: 73.6 kg Body mass index is 28.74 kg/m.   Vital Signs:   Temp:  [97.6 F (36.4 C)-98.6 F (37 C)] 98.2 F (36.8 C) (01/21 0500) Pulse Rate:  [68-92] 73 (01/21 0645) Resp:  [12-33] 20 (01/21 0645) BP: (62-154)/(33-121) 76/54 (01/21 0645) SpO2:  [88 %-97 %] 91 % (01/21 0645) Last BM Date : 12/20/23    Weight change: Filed Weights   12/18/23 0634 12/19/23 0645 12/20/23 0623  Weight: 72.4 kg 73.3 kg 73.6 kg   Intake/Output:   Intake/Output Summary (Last 24 hours) at 12/21/2023 4098 Last data filed at 12/21/2023 0658 Gross per 24 hour  Intake 2330.19 ml  Output 329.7 ml  Net 2000.49 ml   CVP 6 Physical Exam  General: Sitting in the chair. . No resp difficulty HEENT: + Cortrak  Neck: supple. no JVD. Carotids 2+ bilat; no bruits. No lymphadenopathy or thryomegaly appreciated. RIJ  Cor: PMI nondisplaced. Regular rate & rhythm. No rubs, gallops or murmurs. Lungs: clear Abdomen: soft, nontender, nondistended. No hepatosplenomegaly. No bruits or masses. Good bowel sounds.+ Flex Seal.  Extremities: no cyanosis, clubbing, rash, R and LLE ted hose. Trace lower extremity edema.  Neuro: alert & orientedx3, cranial nerves grossly intact. moves all 4 extremities w/o difficulty. Affect flat    Telemetry   SR 80-90s with occasional PVCs  Labs    CBC Recent Labs    12/20/23 0453 12/21/23 0459  WBC 18.8* 17.5*  HGB 8.5* 8.2*  HCT 28.0* 26.9*  MCV 106.5* 105.9*  PLT 86* 82*   Basic Metabolic Panel Recent Labs    11/91/47 0453 12/20/23 1634 12/21/23 0457  NA 133* 132* 131*  K 4.4 4.4 4.7  CL 100 100 97*  CO2 27 24 24   GLUCOSE 157* 149* 142*  BUN 16 19 34*  CREATININE 1.26* 1.61* 2.77*  CALCIUM 8.3* 8.1* 8.6*  MG 2.7*  --  2.6*  PHOS 2.6 2.5 5.0*   Liver Function Tests Recent Labs    12/20/23 1634 12/21/23 0457  ALBUMIN 2.3* 2.3*   No results for input(s): "LIPASE", "AMYLASE" in the last 72 hours. Cardiac Enzymes No results for input(s): "CKTOTAL", "CKMB", "CKMBINDEX", "TROPONINI" in the last 72 hours.  BNP: BNP (last 3 results) Recent Labs    05/26/23 1610 08/26/23 1103 09/22/23 0905  BNP 778.0* 781.0* 926.0*   Other results:  Imaging   No results found.        Medications:   Scheduled Medications:  amiodarone  200 mg Oral BID   ascorbic acid  250 mg Per Tube BID   Chlorhexidine Gluconate Cloth  6 each Topical Daily   darbepoetin (ARANESP) injection - NON-DIALYSIS  40 mcg Subcutaneous Q Tue-1800   feeding  supplement (VITAL AF 1.2 CAL)  660 mL Per Tube Q24H   fiber supplement (BANATROL TF)  60 mL Per Tube QID   Gerhardt's butt cream   Topical BID   influenza vaccine adjuvanted  0.5 mL Intramuscular Tomorrow-1000   insulin aspart  0-24 Units Subcutaneous Q4H   melatonin  3 mg Oral Q24H   midodrine  15 mg Per Tube Q8H   multivitamin  1 tablet Per Tube BID   QUEtiapine  25 mg Oral QHS   sertraline  25 mg Oral Daily   Warfarin - Pharmacist Dosing Inpatient   Does not apply q1600   zinc sulfate (50mg  elemental zinc)  220 mg Per Tube Daily    Infusions:   prismasol BGK 4/2.5 Stopped (12/20/23 1015)    prismasol BGK 4/2.5 Stopped (12/20/23 1015)   sodium chloride 10 mL/hr at 12/18/23 1416   anticoagulant sodium citrate      heparin 950 Units/hr (12/21/23 0600)   meropenem (MERREM) IV Stopped (12/20/23 2134)   norepinephrine (LEVOPHED) Adult infusion 11 mcg/min (12/21/23 0600)   prismasol BGK 4/2.5 Stopped (12/20/23 1015)    PRN Medications: sodium chloride, alteplase, anticoagulant sodium citrate, artificial tears, bisacodyl **OR** bisacodyl, heparin, heparin, HYDROmorphone (DILAUDID) injection, levalbuterol, lidocaine (PF), lidocaine-prilocaine, ondansetron (ZOFRAN) IV, mouth rinse, oxyCODONE, pentafluoroprop-tetrafluoroeth, pneumococcal 20-valent conjugate vaccine, sodium chloride, traZODone  Patient Profile   Vanessa [name] is a 73 y.o. with history of nonischemic cardiomyopathy and severe MR s/p Mitral Clip x2 (2020), pHTN, and CKD IIIb.   Assessment/Plan   1. Post Cardiotomy Cardiogenic & septic shock: Bioprosthetic MVR and tricuspid ring 11/29/23 with Dr. Leafy Ro. Pre-op cath with no significant CAD.  Pre-op echo with EF 40-45%, severe MR. Went back to the OR with post-op tamponade for clean-out.  Echo 12/31 showed EF 25-30%, moderate RV dysfunction, s/p bioprosthetic mitral valve replacement with trivial MR and tricuspid valve repair with trivial TR. Worsened on 1/2 and re-intubated in setting of volume overload, oliguric renal failure &  pulmonary hypertension. Extubated on 12/04/22. 1/13 CT C/A/P  moderate hemorrhagic pericardial effusion and 5.3 cm hematoma right heart border.  Repeat echo no tamponade. Required CRRT for volume removal.  CVVH stopped 1/12 then restarted 01/14 for volume removal. RHC 1/17 showed low filling pressures and mild pulmonary arterial hypertension but CI 1.74.   -Norepi remains at 11 mcg. CO-OX stable.  Continue midodrine 15 mg three times a day.  -   Currently off CRRT.  2. Mitral regurgitation:  TEE in 11/19 with severe MR, restricted posterior leaflet.  She had Mitraclip x 2 in 1/20. Post-op echo showed mild MR and mild mitral stenosis.   Echo in 9/24 showed MV s/p Mitraclip x 2  with mean gradient 6 mmHg and severe mitral regurgitation. TEE was then done to evaluate, showing 2 Mitraclips in place with mean gradient 4 mmHg and severe eccentric MR from between the 2 clips with ERO 0.54 cm^2.  She was not thought to be a candidate for an additional Mitraclip due to lack of room between the two existing clips. R/LHC (11/24) showed moderate mixed pulmonary arterial and pulmonary venous hypertension likely due to mitral valve regurgitation.  S/p MV replacement and Tricuspid repair on 11/29/23 with Dr. Leafy Ro.  - Last echo stable valves.  3. Ventricular ectopy:  - Continue amiodarone 200 mg bid  4. Pulmonary hypertension: Most recent pre-op RHC (11/24) with PVR 4.5, with moderate mixed pulmonary arterial and pulmonary venous hypertension likely due to mitral valve regurgitation. Now s/p  MVR, PA pressure severely elevated post-op.  Suspect still mixed PAH/PVH with elevated PCWP.  NO was tried at 10 ppm but did not have much effect so now off.  Will hold off on pulmonary vasodilators; small clinical trials demonstrate no improvement and some markers of harm with PDE5i in PH in the setting of mitral valve disease. RHC 1/17 with mild pulmonary arterial hypertension.  5. 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 TSH was normal in 8/24. Follows with Endocrinology 6. AKI on CKD stage 3:  1/14 CRRT restarted. CRRT stopped 1/20.  CVP 6.  Remains on NE. Eventual iHD. Marland Kitchen   7. Blood loss anemia - Hgb 8.2, Stable.  8. Complete heart block: She had post-op CHB. Resolved.  9. Hypoxic / Hypercapnic respiratory failure: Reintubated 01/02.  Extubated 01/05.  -Now on room air with stable sats.  10. Atrial fibrillation:  - In SR.  - Continue po amiodarone.   - On Heparin drip + warfarin. INR 1.3. Pharmacy dosing, discussing.  11. ID: Suspected septic shock component, no definite source. All lines changed out last week and started on linezolid/meropenem, now just on meropenem.  Afebrile  but WBCs down a little 17.5.  - Continue meropenem. Stop date 1/25 -12. Thrombocytopenia: Suspect due to sepsis. Platelets mildly lower at 82K.  13. Deconditioning - Continue PT.  14. FEN: Getting tube feeds   Continue to mobilize.    Amy Clegg NP-C  12/21/2023 7:12 AM  Agree with above.   Remains on NE 11 and midodrine 15 tid. CRRT held yesterday. Given 500cc IVF back.   Sitting in chair. Feels weak. Denies Dyspnea. CVP 6. Co-ox 71%  General:  Sitting in chair. Weak appearing. No resp difficulty HEENT: normal +  cor-trak Neck: supple. no JVD. Carotids 2+ bilat; no bruits. No lymphadenopathy or thryomegaly appreciated. WJX:BJYNWGN wound ok  Regular rate & rhythm. No rubs, gallops or murmurs. Lungs: clear Abdomen: soft, nontender, nondistended. No hepatosplenomegaly. No bruits or masses. Good bowel sounds. Extremities: no cyanosis, clubbing, rash, edema  + TED hose Neuro: alert & orientedx3, cranial nerves grossly intact. moves all 4 extremities w/o difficulty. Affect pleasant  Fick CO 8.9L min. SVR 611  She continues with significant pressor requirement despite normal co-ox and adequate filling pressures. I have reviewed echo from last week and both RV and LV function are not too bad. Fick numbers reflect severe vasoplegia despite current therapies. Increase midodrine 20 tid. Droxidopa added by CCM. Continue compression hose. Mobilize. On broad spectrum abx. Attempting to keep MAP >=65 to promote any possible renal recovery.   Suspect she will need tunneled HD cath so would continue heparin and hold off on warfarin load for now.   CRITICAL CARE Performed by: Arvilla Meres  Total critical care time: 40 minutes  Critical care time was exclusive of separately billable procedures and treating other patients.  Critical care was necessary to treat or prevent imminent or life-threatening deterioration.  Critical care was time spent personally by me (independent of midlevel  providers or residents) on the following activities: development of treatment plan with patient and/or surrogate as well as nursing, discussions with consultants, evaluation of patient's response to treatment, examination of patient, obtaining history from patient or surrogate, ordering and performing treatments and interventions, ordering and review of laboratory studies, ordering and review of radiographic studies, pulse oximetry and re-evaluation of patient's condition.  Arvilla Meres, MD  8:51 AM

## 2023-12-22 DIAGNOSIS — R5381 Other malaise: Secondary | ICD-10-CM | POA: Diagnosis not present

## 2023-12-22 DIAGNOSIS — R579 Shock, unspecified: Secondary | ICD-10-CM | POA: Diagnosis not present

## 2023-12-22 DIAGNOSIS — N189 Chronic kidney disease, unspecified: Secondary | ICD-10-CM | POA: Diagnosis not present

## 2023-12-22 DIAGNOSIS — R570 Cardiogenic shock: Secondary | ICD-10-CM | POA: Diagnosis not present

## 2023-12-22 DIAGNOSIS — Z952 Presence of prosthetic heart valve: Secondary | ICD-10-CM | POA: Diagnosis not present

## 2023-12-22 LAB — COOXEMETRY PANEL
Carboxyhemoglobin: 2 % — ABNORMAL HIGH (ref 0.5–1.5)
Methemoglobin: <0.7 % (ref 0.0–1.5)
O2 Saturation: 80.2 %
Total hemoglobin: 8.3 g/dL — ABNORMAL LOW (ref 12.0–16.0)

## 2023-12-22 LAB — RENAL FUNCTION PANEL
Albumin: 2.4 g/dL — ABNORMAL LOW (ref 3.5–5.0)
Albumin: 2.4 g/dL — ABNORMAL LOW (ref 3.5–5.0)
Anion gap: 14 (ref 5–15)
Anion gap: 9 (ref 5–15)
BUN: 51 mg/dL — ABNORMAL HIGH (ref 8–23)
BUN: 34 mg/dL — ABNORMAL HIGH (ref 8–23)
CO2: 23 mmol/L (ref 22–32)
CO2: 22 mmol/L (ref 22–32)
Calcium: 9.1 mg/dL (ref 8.9–10.3)
Calcium: 8.4 mg/dL — ABNORMAL LOW (ref 8.9–10.3)
Chloride: 94 mmol/L — ABNORMAL LOW (ref 98–111)
Chloride: 99 mmol/L (ref 98–111)
Creatinine, Ser: 4.18 mg/dL — ABNORMAL HIGH (ref 0.44–1.00)
Creatinine, Ser: 2.5 mg/dL — ABNORMAL HIGH (ref 0.44–1.00)
GFR, Estimated: 11 mL/min — ABNORMAL LOW (ref >60)
GFR, Estimated: 20 mL/min — ABNORMAL LOW (ref >60)
Glucose, Bld: 132 mg/dL — ABNORMAL HIGH (ref 70–99)
Glucose, Bld: 120 mg/dL — ABNORMAL HIGH (ref 70–99)
Phosphorus: 7.9 mg/dL — ABNORMAL HIGH (ref 2.5–4.6)
Phosphorus: 4.1 mg/dL (ref 2.5–4.6)
Potassium: 5.1 mmol/L (ref 3.5–5.1)
Potassium: 4.6 mmol/L (ref 3.5–5.1)
Sodium: 131 mmol/L — ABNORMAL LOW (ref 135–145)
Sodium: 130 mmol/L — ABNORMAL LOW (ref 135–145)

## 2023-12-22 LAB — MAGNESIUM: Magnesium: 2.7 mg/dL — ABNORMAL HIGH (ref 1.7–2.4)

## 2023-12-22 LAB — CBC
HCT: 26.8 % — ABNORMAL LOW (ref 36.0–46.0)
Hemoglobin: 8.3 g/dL — ABNORMAL LOW (ref 12.0–15.0)
MCH: 32.4 pg (ref 26.0–34.0)
MCHC: 31 g/dL (ref 30.0–36.0)
MCV: 104.7 fL — ABNORMAL HIGH (ref 80.0–100.0)
Platelets: 93 10*3/uL — ABNORMAL LOW (ref 150–400)
RBC: 2.56 MIL/uL — ABNORMAL LOW (ref 3.87–5.11)
RDW: 24.8 % — ABNORMAL HIGH (ref 11.5–15.5)
WBC: 15 10*3/uL — ABNORMAL HIGH (ref 4.0–10.5)
nRBC: 0.9 % — ABNORMAL HIGH (ref 0.0–0.2)

## 2023-12-22 LAB — HEPARIN LEVEL (UNFRACTIONATED)
Heparin Unfractionated: 0.26 [IU]/mL — ABNORMAL LOW (ref 0.30–0.70)
Heparin Unfractionated: 0.37 [IU]/mL (ref 0.30–0.70)

## 2023-12-22 LAB — CORTISOL: Cortisol, Plasma: 21 ug/dL

## 2023-12-22 LAB — GLUCOSE, CAPILLARY
Glucose-Capillary: 106 mg/dL — ABNORMAL HIGH (ref 70–99)
Glucose-Capillary: 123 mg/dL — ABNORMAL HIGH (ref 70–99)
Glucose-Capillary: 127 mg/dL — ABNORMAL HIGH (ref 70–99)
Glucose-Capillary: 127 mg/dL — ABNORMAL HIGH (ref 70–99)
Glucose-Capillary: 127 mg/dL — ABNORMAL HIGH (ref 70–99)
Glucose-Capillary: 196 mg/dL — ABNORMAL HIGH (ref 70–99)

## 2023-12-22 MED ORDER — SERTRALINE HCL 50 MG PO TABS
25.0000 mg | ORAL_TABLET | Freq: Every day | ORAL | Status: DC
  Administered 2023-12-23 – 2023-12-29 (×7): 25 mg
  Filled 2023-12-22 (×7): qty 1

## 2023-12-22 MED ORDER — OXYCODONE HCL 5 MG PO TABS
5.0000 mg | ORAL_TABLET | ORAL | Status: DC | PRN
Start: 2023-12-22 — End: 2023-12-30
  Administered 2023-12-23 – 2023-12-24 (×2): 5 mg
  Filled 2023-12-22 (×2): qty 1

## 2023-12-22 MED ORDER — PROSOURCE TF20 ENFIT COMPATIBL EN LIQD
60.0000 mL | Freq: Two times a day (BID) | ENTERAL | Status: DC
  Administered 2023-12-22 – 2023-12-28 (×14): 60 mL
  Filled 2023-12-22 (×14): qty 60

## 2023-12-22 MED ORDER — PRISMASOL BGK 4/2.5 32-4-2.5 MEQ/L REPLACEMENT SOLN: Status: DC

## 2023-12-22 MED ORDER — EMPTY CONTAINERS FLEXIBLE MISC
80.0000 mg | Freq: Once | Status: AC
  Administered 2023-12-22: 80 mg via INTRAVENOUS
  Filled 2023-12-22: qty 8

## 2023-12-22 MED ORDER — SODIUM CHLORIDE 0.9 % IV SOLN: INTRAVENOUS | Status: AC | PRN

## 2023-12-22 MED ORDER — SODIUM BICARBONATE 8.4 % IV SOLN: 50.0000 meq | Freq: Once | INTRAVENOUS | Status: DC

## 2023-12-22 MED ORDER — QUETIAPINE FUMARATE 25 MG PO TABS
25.0000 mg | ORAL_TABLET | Freq: Every day | ORAL | Status: DC
  Administered 2023-12-22 – 2023-12-28 (×7): 25 mg
  Filled 2023-12-22 (×7): qty 1

## 2023-12-22 MED ORDER — MELATONIN 3 MG PO TABS
3.0000 mg | ORAL_TABLET | Freq: Every evening | ORAL | Status: DC | PRN
  Administered 2023-12-22 – 2023-12-28 (×6): 3 mg
  Filled 2023-12-22 (×6): qty 1

## 2023-12-22 MED ORDER — VITAL 1.5 CAL PO LIQD
1000.0000 mL | ORAL | Status: DC
  Administered 2023-12-22 – 2023-12-27 (×5): 1000 mL
  Filled 2023-12-22: qty 1000

## 2023-12-22 MED ORDER — SODIUM BICARBONATE 8.4 % IV SOLN: INTRAVENOUS | Status: DC

## 2023-12-22 MED ORDER — HEPARIN SODIUM (PORCINE) 1000 UNIT/ML DIALYSIS
1000.0000 [IU] | INTRAMUSCULAR | Status: DC | PRN
  Administered 2023-12-23: 2400 [IU] via INTRAVENOUS_CENTRAL
  Administered 2023-12-28: 3000 [IU] via INTRAVENOUS_CENTRAL
  Administered 2023-12-29: 3200 [IU] via INTRAVENOUS_CENTRAL
  Filled 2023-12-22 (×3): qty 6
  Filled 2023-12-22: qty 3
  Filled 2023-12-22 (×2): qty 6

## 2023-12-22 MED ORDER — PRISMASOL BGK 4/2.5 32-4-2.5 MEQ/L EC SOLN: Status: DC

## 2023-12-22 NOTE — Progress Notes (Signed)
Physical Therapy Treatment Patient Details Name: Vanessa [name] MRN: [id number] DOB: 08-13-1951 Today's Date: 12/22/2023   History of Present Illness 73 yo F admitted 12/30 with DOE, increasing MR and TR. LCH without CAD. 12/30 MVR and tricuspid valve repair. Intubated 1/2-1/5. 1/2 CRRT started/stopped on 1/12. Pt HD line was removed on 1/13 and CRRT was re-started on 1/14-1/20.  PMhx: HTN, NICM, HFrEF (EF40-45%), Pulmonary HTN, CKD and  severe MR s/p prior mitral clip x 2 in 2020.    PT Comments  Pt is slowly progressing towards goals. Gait limited due to CRRT and fatigue from sitting up all morning. Pt was able to stand for ~5 min for skin check and care for nursing staff. Pt is Mod A +2 for sit to stand and Min A for transfers due to need for assist with lines/leads. Due to pt current functional status, home set up and available assistance at home recommending skilled physical therapy services > 3 hours/day in order to address strength, balance and functional mobility to decrease risk for falls, injury, immobility, skin break down and re-hospitalization.      If plan is discharge home, recommend the following: Assistance with cooking/housework;A little help with walking and/or transfers;Help with stairs or ramp for entrance     Equipment Recommendations  Rolling walker (2 wheels);BSC/3in1       Precautions / Restrictions Precautions Precautions: Sternal;Fall;Other (comment) Precaution Booklet Issued: No Precaution Comments: cortrak, watch vitals Restrictions Weight Bearing Restrictions Per Provider Order: Yes RUE Weight Bearing Per Provider Order: Non weight bearing LUE Weight Bearing Per Provider Order: Non weight bearing Other Position/Activity Restrictions: sternal precautions     Mobility  Bed Mobility Overal bed mobility: Needs Assistance Bed Mobility: Sit to Supine       Sit to supine: Mod assist   General bed mobility comments: verbal cues for sequencing with Mod A for  bil LE to get to supine from sitting.    Transfers Overall transfer level: Needs assistance Equipment used: None, Rolling walker (2 wheels) Transfers: Sit to/from Stand, Bed to chair/wheelchair/BSC Sit to Stand: +2 physical assistance, Mod assist   Step pivot transfers: Min assist, +2 safety/equipment, +2 physical assistance       General transfer comment: Pt requires Mod A to scoot to edge of recliner while maintaining precautions. Min A +2 for sit to stand from recliner while maintaining sternal precautions with use of sheet to help stand. Min A +2 for step pivot transfers for balance and assist navigating AD for turns with verbal cues for safety.    Ambulation/Gait Ambulation/Gait assistance: Min assist, +2 physical assistance, +2 safety/equipment Gait Distance (Feet): 5 Feet Assistive device: Rolling walker (2 wheels) Gait Pattern/deviations: Step-through pattern, Decreased stride length, Wide base of support Gait velocity: decreased Gait velocity interpretation: <1.31 ft/sec, indicative of household ambulator   General Gait Details: Min A +2 for stepping from recliner to EOB. Deferred further gait due to fatigue from sitting up in recliner all morning.      Balance Overall balance assessment: Needs assistance Sitting-balance support: No upper extremity supported, Feet supported, Feet unsupported Sitting balance-Leahy Scale: Good Sitting balance - Comments: SBA for sitting EOB no LOB   Standing balance support: Bilateral upper extremity supported, Reliant on assistive device for balance Standing balance-Leahy Scale: Fair Standing balance comment: no overt LOB.          Cognition Arousal: Alert Behavior During Therapy: WFL for tasks assessed/performed, Flat affect Overall Cognitive Status: Within Functional Limits for tasks assessed  Memory: Decreased recall of precautions   Safety/Judgement: Decreased awareness of safety      General Comments: Pt in improved spirits this session.           General Comments General comments (skin integrity, edema, etc.): Pt reports no dizziness or lightheadedness.      Pertinent Vitals/Pain Pain Assessment Pain Assessment: No/denies pain     PT Goals (current goals can now be found in the care plan section) Acute Rehab PT Goals Patient Stated Goal: return home PT Goal Formulation: With patient Time For Goal Achievement: 01/03/24 Potential to Achieve Goals: Good Progress towards PT goals: Progressing toward goals    Frequency    Min 1X/week      PT Plan  Continue with current POC        AM-PAC PT "6 Clicks" Mobility   Outcome Measure  Help needed turning from your back to your side while in a flat bed without using bedrails?: A Lot Help needed moving from lying on your back to sitting on the side of a flat bed without using bedrails?: A Lot Help needed moving to and from a bed to a chair (including a wheelchair)?: A Lot Help needed standing up from a chair using your arms (e.g., wheelchair or bedside chair)?: A Lot Help needed to walk in hospital room?: A Lot Help needed climbing 3-5 steps with a railing? : Total 6 Click Score: 11    End of Session Equipment Utilized During Treatment: Oxygen;Gait belt Activity Tolerance: Patient limited by fatigue Patient left: with call bell/phone within reach;with nursing/sitter in room;in bed Nurse Communication: Mobility status PT Visit Diagnosis: Other abnormalities of gait and mobility (R26.89);Muscle weakness (generalized) (M62.81);Difficulty in walking, not elsewhere classified (R26.2)     Time: 8295-6213 PT Time Calculation (min) (ACUTE ONLY): 24 min  Charges:    $Therapeutic Activity: 23-37 mins PT General Charges $$ ACUTE PT VISIT: 1 Visit                    Harrel Carina, DPT, CLT  Acute Rehabilitation Services Office: [phone] (Secure chat preferred)    Claudia Desanctis 12/22/2023,  12:23 PM

## 2023-12-22 NOTE — Progress Notes (Signed)
[facility] NEPHROLOGY PROGRESS NOTE  Assessment/ Plan: Pt is a 73 y.o. yo female  with a past medical history HTN, NICM, CHF, pulmonary hypertension, mitral and tricuspid valve disease who present [name]/ surgery for valvular disease c/b shock, AHRF, and AKI   1)  Anuric AKI on CKD 3A: Multifactorial etiology including cardiorenal syndrome and ischemic ATN due to shock.  Started CRRT on 1/2 for fluid overload.  The CRRT was held on 1/12 because of worsening leukocytosis and poor access with plan to have line holiday. IHD on 1/13 with IDH and no UF.   Resumed CRRT on 1/14-1/20 (new cath on 1/14) to manage volume At that time the CVP was only 5 and had been running even for CRRT was stopped .    No UOP -> discussed with RN and will BladderScan but I am not expecting to see a large amount.  CVP 9 but it was in the chair; planning on restarting CRRT.  No signs of infection but the patient is on Merrem.  On heparin drip and have not transition to Coumadin in anticipation for a tunneled catheter.  Will ask VIR to convert the temporary catheter to a TC.  White count is downtrending and no fevers for over 48 hours.  2) #Multifactorial shock: Vasoplegic and cardioplegia catheter surgery.   - midodrine incr to 20mg  TID, milrinone off on 1/20, droxidopa trial and norepi currently at 15. Cont mgmt per primary team   3) Acute exacerbation of heart failure with reduced ejection fraction: Volume overload. Optimized with CRRT   4) Severe MR and tricuspid valvular disease:  - Status post repair.  CT surgery managing   5) Postoperative cardiac tamponade:  - Continue monitoring per cardiology   6)  Hyponatremia: improved with CRRT    7) Anemia: Related to blood loss and compounded by AKI and critical illness.    - Transfusions per primary team.   - aranesp 40 mcg weekly on Tuesdays   8)  Thrombocytopenia: switched from heparin to coumadin.    Subjective: Off CRRT on 1/20 (had been near  even). Norepi 15 this AM -> currently 8  and off  milrinone on 1/20. No complaints.   Objective Vital signs in last 24 hours: Vitals:   12/22/23 0545 12/22/23 0600 12/22/23 0615 12/22/23 0630  BP: (!) 112/56 (!) 117/58 (!) 113/52 (!) 108/54  Pulse: 71 72 71 74  Resp: 13 12 17 16   Temp:      TempSrc:      SpO2: 99% 99% 98% 99%  Weight:      Height:       Weight change:   Intake/Output Summary (Last 24 hours) at 12/22/2023 0721 Last data filed at 12/22/2023 0600 Gross per 24 hour  Intake 1752.63 ml  Output --  Net 1752.63 ml       Labs: RENAL PANEL Recent Labs  Lab 12/18/23 0419 12/18/23 1502 12/19/23 0448 12/19/23 1522 12/20/23 0453 12/20/23 1634 12/21/23 0457 12/21/23 1700 12/22/23 0344  NA 131*   < > 132*   < > 133* 132* 131* 128* 131*  K 5.0   < > 4.7   < > 4.4 4.4 4.7 5.2* 5.1  CL 97*   < > 98   < > 100 100 97* 96* 94*  CO2 24   < > 25   < > 27 24 24  20* 23  GLUCOSE 175*   < > 173*   < > 157* 149* 142* 127* 132*  BUN 23   < > 19   < > 16 19 34* 41* 51*  CREATININE 1.20*   < > 1.10*   < > 1.26* 1.61* 2.77* 3.40* 4.18*  CALCIUM 8.5*   < > 8.4*   < > 8.3* 8.1* 8.6* 8.8* 9.1  MG 2.8*  --  2.7*  --  2.7*  --  2.6*  --  2.7*  PHOS 3.2   < > 3.0   < > 2.6 2.5 5.0* 6.0* 7.9*  ALBUMIN 2.8*   < > 2.6*   < > 2.5* 2.3* 2.3* 2.5* 2.4*   < > = values in this interval not displayed.    Liver Function Tests: Recent Labs  Lab 12/16/23 0417 12/16/23 1601 12/21/23 0457 12/21/23 1700 12/22/23 0344  AST 35  --   --   --   --   ALT 45*  --   --   --   --   ALKPHOS 64  --   --   --   --   BILITOT 7.4*  --   --   --   --   PROT 7.0  --   --   --   --   ALBUMIN 2.6*   < > 2.3* 2.5* 2.4*   < > = values in this interval not displayed.   No results for input(s): "LIPASE", "AMYLASE" in the last 168 hours. No results for input(s): "AMMONIA" in the last 168 hours.  CBC: Recent Labs    12/18/23 0419 12/19/23 0448 12/20/23 0453 12/21/23 0459 12/22/23 0344  HGB 10.3*  9.4* 8.5* 8.2* 8.3*  MCV 103.1* 103.0* 106.5* 105.9* 104.7*    Cardiac Enzymes: No results for input(s): "CKTOTAL", "CKMB", "CKMBINDEX", "TROPONINI" in the last 168 hours. CBG: Recent Labs  Lab 12/21/23 1109 12/21/23 1711 12/21/23 1937 12/21/23 2343 12/22/23 0349  GLUCAP 88 103* 144* 151* 196*    Iron Studies: No results for input(s): "IRON", "TIBC", "TRANSFERRIN", "FERRITIN" in the last 72 hours. Studies/Results: No results found.     Medications: Infusions:   prismasol BGK 4/2.5 Stopped (12/20/23 1015)    prismasol BGK 4/2.5 Stopped (12/20/23 1015)   anticoagulant sodium citrate     heparin 950 Units/hr (12/22/23 0600)   meropenem (MERREM) IV Stopped (12/21/23 2152)   norepinephrine (LEVOPHED) Adult infusion 16 mcg/min (12/22/23 0600)   prismasol BGK 4/2.5 Stopped (12/20/23 1015)    Scheduled Medications:  amiodarone  200 mg Oral BID   ascorbic acid  250 mg Per Tube BID   Chlorhexidine Gluconate Cloth  6 each Topical Daily   darbepoetin (ARANESP) injection - NON-DIALYSIS  40 mcg Subcutaneous Q Tue-1800   droxidopa  200 mg Per Tube TID WC   feeding supplement (VITAL AF 1.2 CAL)  660 mL Per Tube Q24H   fiber supplement (BANATROL TF)  60 mL Per Tube QID   Gerhardt's butt cream   Topical BID   influenza vaccine adjuvanted  0.5 mL Intramuscular Tomorrow-1000   insulin aspart  0-24 Units Subcutaneous Q4H   midodrine  20 mg Per Tube Q8H   multivitamin  1 tablet Per Tube BID   QUEtiapine  25 mg Oral QHS   sertraline  25 mg Oral Daily   zinc sulfate (50mg  elemental zinc)  220 mg Per Tube Daily    have reviewed scheduled and prn medications.  Physical Exam: General:NAD, comfortable Heart:normal rate, no rub Lungs:  Bilateral chest rise, no iwob Abdomen:soft, Non-tender, non-distended Extremities: Peripheral  edema trace, cool  lower extremities  Dialysis Access: .  Right IJ temporary HD catheter was replaced by PCCM on 1/14.  Vanessa [name] 12/22/2023,7:21 AM  LOS:  23 days

## 2023-12-22 NOTE — Progress Notes (Signed)
Got methylene blue per AHF. Levo requirement is coming down. Cortisol was normal, no steroids for now.

## 2023-12-22 NOTE — Progress Notes (Signed)
NAME:  Vanessa [name], MRN:  [id number], DOB:  Jul 08, 1951, LOS: 23 ADMISSION DATE:  11/29/2023, CONSULTATION DATE:  11/29/23 REFERRING MD:  Dr. Leafy Ro, CHIEF COMPLAINT:  postop   History of Present Illness:  72yoF with PMH significant for HTN, NICM, HFrEF (EF40-45%), PHTN, CKD and hx of prior severe MR s/p prior mitral clip x 2 in 2020.  Pt with progressive DOE found to have increasing MR  and moderate TR despite adjustments in GDMT.  Not felt to be candidate for additional mitral clip.  Repeat LHC without CAD but showed moderate PHTN.  Underwent mitral valve replacement with tissue valve and tricuspid valve repair by Dr. Leafy Ro on 12/30.  PCCM consulted to assist with vent and medical management post-operatively in ICU.   Pertinent  Medical History  Never smoker, MR w/ previous Mitral clip x 2 (2020), NICM, HFrEF, PHTN, HTN, CKD3b  Significant Hospital Events: Including procedures, antibiotic start and stop dates in addition to other pertinent events   MVR by Dr. Leafy Ro; relook for tamponade/washout later in evening without obvious source 1/2 intubated for respiratory decompensation, iNO started. Vanc, meropenem started. CRRT started.  1/5 extubated 1/8 converted to NSR overnight 1/9 back to A.fib around 0200 1/11 R introducer out.  Unable to thread right subclavian catheter 1/12 remains weak still pressor dependent 1/13 repeat [facility] sent. Still on pressors..,  Zosyn changed to meropenem.  Chest abdomen and pelvis CT obtained : Moderate hemorrhagic pericardial effusion, 5.3 cm hematoma right thrombus. Arterial gas bilateral patchy opacities mild dependent posterior right upper lobe atelectasis was unremarkable layering gallstones  1/14 new right IJ HD catheter placed.  The echocardiogram Was obtained to better evaluate her EF is 35 to 40%.  The LV demonstrates regional wall motion abnormalities there is asymmetric left ventricular hypertrophy of the inferior lateral segment RV systolic  function moderate regular reduced left atrial wall dilated no mention of clot.  1/15 still on fairly high-dose norepinephrine 1/16 cont CRRT + low dose NE  1/17 RHC confirms low output state, restarted on milrinone 1/21: dc milrinone yesterday, ongoing increase NE requirements during nights. Started droxidopa in addition to midodrine to aid off NE.  1/22: levo up to 18 overnight (max so far), cvp 9, restarting crrt today Interim History / Subjective:  Up to 18 levo overnight which is highest it has been. She actually feels better today than yesterday. Generally tired, still no appetite. CVP up to 9 and net +. Restarting CRRT today.     prismasol BGK 4/2.5 Stopped (12/20/23 1015)    prismasol BGK 4/2.5 Stopped (12/20/23 1015)   anticoagulant sodium citrate     heparin 950 Units/hr (12/22/23 0600)   meropenem (MERREM) IV Stopped (12/21/23 2152)   norepinephrine (LEVOPHED) Adult infusion 16 mcg/min (12/22/23 0600)   prismasol BGK 4/2.5 Stopped (12/20/23 1015)     Objective   Blood pressure (!) 108/54, pulse 74, temperature 97.9 F (36.6 C), temperature source Oral, resp. rate 16, height 5\' 3"  (1.6 m), weight 79.2 kg, SpO2 99%. CVP:  [0 mmHg-11 mmHg] 10 mmHg      Intake/Output Summary (Last 24 hours) at 12/22/2023 0718 Last data filed at 12/22/2023 0600 Gross per 24 hour  Intake 1752.63 ml  Output --  Net 1752.63 ml   Filed Weights   12/19/23 0645 12/20/23 0623 12/22/23 0500  Weight: 73.3 kg 73.6 kg 79.2 kg   Examination:  General: older female, acute on chronically ill appearing, OOB in chair, no acute distress  HEENT: [place]/at,  perrla, no jvd, mmm, hd line rij  Pulm: diminished LLL lung field, no wheezing, rhonchi, on 4LNC with sat 93%, mild increased wob (not subjectively SOB) Cardiac: rrr, no rub, gallop  Abdomen: rounded, soft, non-tender Extremities: no edema  Neuro: globally weak, rass 0, oriented, non focal  Resolved Hospital Problem list :   Postoperative vent  management Postop tamponade resolved after relook and washout on day 1 Assessment & Plan:   Sev Mitral regurg s/p prior mitral clip, now s/p MVR Mod tricuspid regurg s/p TV repair Persistent cardioplegia postop- confirmed on RHC pHTN AoC HFrEF NICM pAF, flutter  S/p MVR and tricuspid ring 12/30 w/ Weldner. Back to OR for tamponade. Decompensated requiring intubation for FVO, renal failure, pHTN. 1/17 RHC with low filling pressures and started on milrinone, subsequently off. Con't to have vasopressor requirements (increased at night). Coox stable at 80.2 this AM - con't NE for MAP >65, consider changing MAP goal. Unclear at this time why pressor requirement continues to increase. Does not appear to be in cardiogenic shock. May be ongoing vasoplegia post-operative and concomitant sepsis.  - increased midodrine to 20mg  TID  - add droxidopa 200mg  TID  - con't heparin gtt for afib  - con't amiodarone 200 BID per AHF  - now on warfarin, monitor INR - IS (only getting 250cc volumes)  - OOB to chair as able   ?Sepsis unclear source  Thrombocytopenia stable Had pan scan 1/13 with patchy lower lobe opacities, no AP findings. White count is elevated but flat over 3 days. No fever past 24hrs. No definite source. Lines were removed and replaced last week. Initially on linezolid/mero. WBC downtrending, no fever - con't meropenem - stop 1/25 - consider rescan if begins to fever, clinical decomp - trend fever, wbc curve  Acute renal failure on CKD III 1/14 CRRT started, stopped 1/20., restarted 1/22  - needs eventual iHD but remains on NE  - restart CRRT per nephro, goal 50-100cc/hr ultrafiltrate if BP tolerates - nephro following, appreciate input    Protein calorie malnutrition Physical deconditioning  Deep tissue injury on left buttocks approximately 2 cm in diameter purple discoloration - encourage PO intake, she has no appetite, consider appetite stimulant  - consider remeron  - mtvm,  -  feeding supplement  - PT/OT  - OOB as able   Situational depression Sleep disturbance - melatonin PRN  - seroquel 25mg  daily  - zoloft 25mg  daily  - trazodone 100mg  PRN  Best Practice (right click and "Reselect all SmartList Selections" daily)   Diet/type: Encourage PO, no appetite, supplement TF DVT prophylaxis systemic heparin --> warfarin  Pressure ulcer(s): L buttocks  GI prophylaxis: N/A Lines: HD cath  Foley:  N/A Code Status:  full code Last date of multidisciplinary goals of care discussion [per primary]  CC time: 33 minutes Cristopher Peru, PA-C [facility] 12/22/23 7:18 AM  Please see [email or web address] for pager details.  From 7A-7P if no response, please call [phone] After hours, please call ELink [phone]

## 2023-12-22 NOTE — Plan of Care (Signed)
  Problem: Clinical Measurements: Goal: Cardiovascular complication will be avoided Outcome: Progressing   Problem: Nutrition: Goal: Adequate nutrition will be maintained Outcome: Progressing   Problem: Safety: Goal: Ability to remain free from injury will improve Outcome: Progressing   Problem: Respiratory: Goal: Respiratory status will improve Outcome: Progressing   Problem: Skin Integrity: Goal: Wound healing without signs and symptoms of infection Outcome: Progressing

## 2023-12-22 NOTE — Progress Notes (Addendum)
PHARMACY - ANTICOAGULATION CONSULT NOTE  Pharmacy Consult for IV heparin Indication: atrial fibrillation  No Known Allergies  Patient Measurements: Height: 5\' 3"  (160 cm) Weight: 79.2 kg (174 lb 9.7 oz) (Pillows subtracted from bed weight) IBW/kg (Calculated) : 52.4 Heparin Dosing Weight: ~ 70 kg  Vital Signs: Temp: 97.9 F (36.6 C) (01/22 0315) Temp Source: Oral (01/22 0315) BP: 113/52 (01/22 0615) Pulse Rate: 71 (01/22 0615)  Labs: Recent Labs    12/20/23 0453 12/20/23 1634 12/21/23 0457 12/21/23 0459 12/21/23 1700 12/22/23 0344  HGB 8.5*  --   --  8.2*  --  8.3*  HCT 28.0*  --   --  26.9*  --  26.8*  PLT 86*  --   --  82*  --  93*  APTT 74*  --  87*  --   --   --   LABPROT 15.9*  --  16.0*  --   --   --   INR 1.3*  --  1.3*  --   --   --   HEPARINUNFRC 0.31  --  0.28*  --   --  0.26*  CREATININE 1.26*   < > 2.77*  --  3.40* 4.18*   < > = values in this interval not displayed.    Estimated Creatinine Clearance: 12.1 mL/min (A) (by C-G formula based on SCr of 4.18 mg/dL (H)).  Warfarin doses received (INR): 1/15 - 2.5 (1.1) 1/16 - 2.5 (1.1) 1/17 - 2.5 (1.2) 1/18 - 2.5 (1.2) 1/19 - 5 (1.3) 1/20 - 5 (1.3) 1/21 - hold for permanent dialysis access plans  Assessment: 73 yo female s/p scheduled bioprosthetic mitral valve replacement with tissue valve 12/30 now with afib, pharmacy asked to dose anticoagulation with IV heparin. Spontaneously converted 1/7; flipped back to AF overnight 1/9 0300. She is noted on CRRT. Baseline INR 1.1, slow uptrend to 1.3 s/p 6 days warfarin therapy, total dose 20 mg thus far. CRRT held 1/20 at 1015 with filter change/clot, will need to evaluate dialysis plans. Warfarin held 1/21 pending permanent dialysis access plans.  HL subtherapeutic at 0.26, slow downtrend. CBC remains stable/slightly improved. Anticipate dialysis to resume with anuric status and rising BUN, K, Scr. VIR consult for permanent dialysis access. Still requiring  norepinephrine, midodrine, and droxidopa (added 1/21) for hemodynamic tolerance of dialysis. No new s/sx bleeding per discussion with RN, no noted issues with line at this time.  Goal of Therapy:  Heparin level 0.3-0.5 units/ml INR goal 2 - 3 (bMVR) Monitor platelets by anticoagulation protocol: Yes   Plan:  Increase heparin to 1050 (~1.4 u/kg/hr increase from previous rate of 950) Hold warfarin, f/u restart after permanent dialysis access established F/u 8 hour heparin level Daily heparin level, CBC and PRN (will stop monitoring aPTT with downtrending Tbili and several days relatively stable) F/u IV access if temp cath pulled (previous issues with lab draw site vs infusion line)  Rutherford Nail, PharmD PGY2 Critical Care Pharmacy Resident  12/22/2023 6:27 AM   [facility] pharmacy phone numbers are listed on [email or web address]

## 2023-12-22 NOTE — Progress Notes (Signed)
Nutrition Follow-up  DOCUMENTATION CODES:   Not applicable  INTERVENTION:   If pt continues with essentially no oral intake (sips and bites at best, not taking ONS) and if aligns with GOC, need to consider more permanent enteral access such as a PEG tube  Liberalize diet to REGULAR with 1800 mL Fluid Restriction  Tube Feeding via Cortrak: Increase back to goal TF regimen Change to Vital 1.5 at 40 ml/hr with Pro-Source TF20 60 mL BID TF regimen provides 1600 kcals, 105 g of protein and 730 mL of free water  Continue Renal MVI BID with Vitamin C 250 mg BID and Zinc Sulfate 200 mg daily  Continue Banatrol for now  NUTRITION DIAGNOSIS:   Inadequate oral intake related to acute illness as evidenced by NPO status.  Being addressed via TF  GOAL:   Patient will meet greater than or equal to 90% of their needs  Progressing  MONITOR:   PO intake, Supplement acceptance, TF tolerance, Labs, Weight trends  REASON FOR ASSESSMENT:   Consult Enteral/tube feeding initiation and management, Assessment of nutrition requirement/status (CRRT)  ASSESSMENT:   73 yo admitted with severe MR/TR requiring MVR/TVR complicated by post op bleeding and shock. Pt intubated and CRRT initiated. PMH includes HTN, NICM, HFrEF (EF40-50%), HTN, CKD and hx of prior severe MR s/p mitral clip x 2 in 2020.  12/30 Admitted, severe MR, moderate TR, OR for MVR with mosaic porcine tissue prosthesis, TVR, post op cardiogenic shock, post op bleeding requiring return to OR for washout 12/31 Extubated 01/02 Re-Intubated, iNO started, CRRT initiated 01/03 Cortrak, trickle TF 01/09 US abdomen with dilated GB, no stones/sludge 01/05 Extubated 01/12 CRRT discontinued, diet advanced to 2g sodium 01/14 CRRT resumed 01/17 Transitioned to nocturnal TF to see if po improves, Calorie Count. RHC with low cardiac output, restarted on milrinone 01/20 CRRT stopped, Calorie Count revealed pt eating minimally (7% calorie needs,  2% protein needs), not taking supplements  Reinitiating CRRT Today. Levophed at 16 . Remains anuric   PO intake remains extremely poor, essentially non-existent. Pt take bites/sips here and there but eating basically 0-5%  Tolerating Nocturnal TF of Vital AF 1.2 at 55 ml/hr x 12 hours since 1/17.  Pt has been on nocturnal TF x 5 days without improvement of appetite or po intake suggesting the TF itself is not the issue. Plan to resume goal TF to meet 100% of nutritional needs.  Plan to change to more concentrated formula to provide less free water as well.   Electrolyte abnormalities should improve with re-initiation of CRRT today  Weight is up post CRRT discontinuation. Current wt 79.2 kg; lowest wt of 72.4 kg on 1/18 with weight of 73.6 kg the day CRRT discontinued (1/20). Dry wt appears to be around 72-73 kg currently  Rectal tube removed yesterday; +large stool today but more formed now, type 6. Continue Banatrol for now  Labs: sodium 131 (L), BUN 51, Creatinine 4.18, phosphorus 7.9 (H) Meds: midodrine, rena-vite BID, zofran prn   Diet Order:   Diet Order             Diet 2 gram sodium Fluid consistency: Thin; Fluid restriction: 1800 mL Fluid  Diet effective now                   EDUCATION NEEDS:   Not appropriate for education at this time  Skin:  Skin Assessment: Skin Integrity Issues: Skin Integrity Issues:: DTI DTI: L Buttocks Incisions: chest post sternotomy (closed)  Last  BM:  1/22 (rectal tube removed yesterda)  Height:   Ht Readings from Last 1 Encounters:  12/04/23 5\' 3"  (1.6 m)    Weight:   Wt Readings from Last 1 Encounters:  12/22/23 79.2 kg    BMI:  Body mass index is 30.93 kg/m.  Estimated Nutritional Needs:   Kcal:  1500-1700 kcals  Protein:  90-115 g  Fluid:  1.5 L   Romelle Starcher MS, RDN, LDN, CNSC Registered Dietitian 3 Clinical Nutrition RD Inpatient Contact Info in Amion

## 2023-12-22 NOTE — Progress Notes (Signed)
Per Dr. Gala Romney, titrate levo to keep SBP >90. Keep CRRT even, unless CVP exceeds 10. Nephrology updated. Orders changed to reflect.

## 2023-12-22 NOTE — Progress Notes (Signed)
PHARMACY - ANTICOAGULATION CONSULT NOTE  Pharmacy Consult for IV heparin Indication: atrial fibrillation  No Known Allergies  Patient Measurements: Height: 5\' 3"  (160 cm) Weight: 79.2 kg (174 lb 9.7 oz) (Pillows subtracted from bed weight) IBW/kg (Calculated) : 52.4 Heparin Dosing Weight: ~ 70 kg  Vital Signs: Temp: 97.8 F (36.6 C) (01/22 1630) Temp Source: Axillary (01/22 1630) BP: 87/58 (01/22 1800) Pulse Rate: 75 (01/22 1800)  Labs: Recent Labs    12/20/23 0453 12/20/23 1634 12/21/23 0457 12/21/23 0459 12/21/23 1700 12/22/23 0344 12/22/23 1701  HGB 8.5*  --   --  8.2*  --  8.3*  --   HCT 28.0*  --   --  26.9*  --  26.8*  --   PLT 86*  --   --  82*  --  93*  --   APTT 74*  --  87*  --   --   --   --   LABPROT 15.9*  --  16.0*  --   --   --   --   INR 1.3*  --  1.3*  --   --   --   --   HEPARINUNFRC 0.31  --  0.28*  --   --  0.26* 0.37  CREATININE 1.26*   < > 2.77*  --  3.40* 4.18* 2.50*   < > = values in this interval not displayed.    Estimated Creatinine Clearance: 20.3 mL/min (A) (by C-G formula based on SCr of 2.5 mg/dL (H)).  Warfarin doses received (INR): 1/15 - 2.5 (1.1) 1/16 - 2.5 (1.1) 1/17 - 2.5 (1.2) 1/18 - 2.5 (1.2) 1/19 - 5 (1.3) 1/20 - 5 (1.3) 1/21 - hold for permanent dialysis access plans  Assessment: 73 yo female s/p scheduled bioprosthetic mitral valve replacement with tissue valve 12/30 now with afib, pharmacy asked to dose anticoagulation with IV heparin. Spontaneously converted 1/7; flipped back to AF overnight 1/9 0300. She is noted on CRRT. Baseline INR 1.1, slow uptrend to 1.3 s/p 6 days warfarin therapy, total dose 20 mg thus far. CRRT held 1/20 at 1015 with filter change/clot, will need to evaluate dialysis plans. Warfarin held 1/21 pending permanent dialysis access plans.  HL subtherapeutic at 0.26, slow downtrend. CBC remains stable/slightly improved. Anticipate dialysis to resume with anuric status and rising BUN, K, Scr. VIR  consult for permanent dialysis access. Still requiring norepinephrine, midodrine, and droxidopa (added 1/21) for hemodynamic tolerance of dialysis. No new s/sx bleeding per discussion with RN, no noted issues with line at this time.  Goal of Therapy:  Heparin level 0.3-0.5 units/ml INR goal 2 - 3 (bMVR) Monitor platelets by anticoagulation protocol: Yes   Plan:  Increase heparin to 1050 (~1.4 u/kg/hr increase from previous rate of 950) Hold warfarin, f/u restart after permanent dialysis access established F/u 8 hour heparin level Daily heparin level, CBC and PRN (will stop monitoring aPTT with downtrending Tbili and several days relatively stable) F/u IV access if temp cath pulled (previous issues with lab draw site vs infusion line)  1/22 PM - now s/p TDC.  HL 0.37 therapeutic with rate increase to 1050 units/hr.  Continue current rate, f/u AM labs.  Trixie Rude, PharmD Clinical Pharmacist 12/22/2023  6:24 PM

## 2023-12-22 NOTE — Consult Note (Addendum)
Chief Complaint: Patient was seen in consultation today for AKI on CKD 3 with fluid overload  Procedure: Tunneled Dialysis Catheter Placement   Referring Physician(s): Dr. Paulene Floor   Supervising Physician: Marliss Coots  Patient Status: [facility] - In-pt  History of Present Illness: Vanessa [name] is a 73 y.o. female with a history of multi-nodular goiter, HTN, CKD 3, non-ischemic cardiomyopathy, moderate tricuspid regurgitation, and mitral valve disease with worsening MR and DOE with ambulation s/p mitral valve replacement and tricuspid valve repair with Dr. Leafy Ro on 11/28/24. Post-op she was found to have a large clot in the pericardium consistent with tamponade and had to return to the OR. While in ICU found to have AKI and fluid overload and Nephrology consulted on 12/02/23. Immediately started on CRRT with a temporary HD cath placed in critical care. Due to worsening leukocytosis on 12/12/23, the first temp cath was pulled and the patient had a line holiday. A new temp cath was placed in critical care on 12/14/23 and patient has been tolerating CRRT well. IR with request to convert temp cath to tunneled cath.   Patient sitting in bedside chair undergoing CRRT on arrival to room. States that she had not been informed she would be undergoing a procedure for a tunneled HD cath placement. Discussed the request with her per Nephrology, but informed her procedure will likely be later this week given her current WBC count is 15.0. Patient verbalized understanding. Currently has no complaints.   Code Status: Full Code per pt  Past Medical History:  Diagnosis Date   CHF (congestive heart failure) (HCC)    CKD (chronic kidney disease)    Heart murmur    Hypertension    Moderate mitral regurgitation    moderate to severe MR with moderate pulmonary HTN   Multinodular goiter    Nonischemic cardiomyopathy (HCC)    EF 30-35% by echo 2015   Pulmonary HTN (HCC)    moderate with PASP by  echo 2015   PVC's (premature ventricular contractions)     Past Surgical History:  Procedure Laterality Date   CARDIAC CATHETERIZATION  2003   normal coronary arteries   CARDIAC CATHETERIZATION  2013  Nov   h/o nonischemic DCM EF 35-40% increase LVEDP    EXPLORATION POST OPERATIVE OPEN HEART N/A 11/29/2023   Procedure: EXPLORATION POST OPERATIVE OPEN HEART;  Surgeon: Eugenio Hoes, MD;  Location: [facility] OR;  Service: Open Heart Surgery;  Laterality: N/A;   HYSTEROSCOPY WITH D & C  12/02/2012   Procedure: DILATATION AND CURETTAGE /HYSTEROSCOPY;  Surgeon: Dorien Chihuahua. Richardson Dopp, MD;  Location: [facility] ORS;  Service: Gynecology;  Laterality: N/A;  cervical polypectomy   LEFT AND RIGHT HEART CATHETERIZATION WITH CORONARY ANGIOGRAM N/A 02/15/2015   Procedure: LEFT AND RIGHT HEART CATHETERIZATION WITH CORONARY ANGIOGRAM;  Surgeon: Corky Crafts, MD;  Location: [facility] CATH LAB;  Service: Cardiovascular;  Laterality: N/A;   MITRAL VALVE REPLACEMENT N/A 11/29/2023   Procedure: MITRAL VALVE (MV) REPLACEMENT USING MOSAIC 310CINCH TISSUE VALVE;  Surgeon: Eugenio Hoes, MD;  Location: [facility] OR;  Service: Open Heart Surgery;  Laterality: N/A;   RIGHT HEART CATH N/A 12/17/2023   Procedure: RIGHT HEART CATH;  Surgeon: Laurey Morale, MD;  Location: [facility] INVASIVE CV LAB;  Service: Cardiovascular;  Laterality: N/A;   RIGHT/LEFT HEART CATH AND CORONARY ANGIOGRAPHY N/A 10/12/2023   Procedure: RIGHT/LEFT HEART CATH AND CORONARY ANGIOGRAPHY;  Surgeon: Laurey Morale, MD;  Location: [facility] INVASIVE CV LAB;  Service: Cardiovascular;  Laterality: N/A;   TEE WITHOUT CARDIOVERSION N/A 10/11/2018   Procedure: TRANSESOPHAGEAL ECHOCARDIOGRAM (TEE);  Surgeon: Laurey Morale, MD;  Location: [facility] ENDOSCOPY;  Service: Cardiovascular;  Laterality: N/A;   TEE WITHOUT CARDIOVERSION N/A 09/07/2023   Procedure: TRANSESOPHAGEAL ECHOCARDIOGRAM;  Surgeon: Laurey Morale, MD;  Location: [facility] INVASIVE CV LAB;  Service: Cardiovascular;  Laterality: N/A;    TEE WITHOUT CARDIOVERSION N/A 11/29/2023   Procedure: TRANSESOPHAGEAL ECHOCARDIOGRAM;  Surgeon: Eugenio Hoes, MD;  Location: [facility] OR;  Service: Open Heart Surgery;  Laterality: N/A;   TRANSCATHETER MITRAL EDGE TO EDGE REPAIR N/A 12/14/2018   Procedure: MITRAL VALVE REPAIR;  Surgeon: Tonny Bollman, MD;  Location: [facility] INVASIVE CV LAB;  Service: Cardiovascular;  Laterality: N/A;   TRICUSPID VALVE REPLACEMENT N/A 11/29/2023   Procedure: TRICUSPID VALVE REPAIR USING MC3 EDWARDS TRICUSPID ANNULOPLASTY RING;  Surgeon: Eugenio Hoes, MD;  Location: [facility] OR;  Service: Open Heart Surgery;  Laterality: N/A;    Allergies: Patient has no known allergies.  Medications: Prior to Admission medications   Medication Sig Start Date End Date Taking? Authorizing Provider  aspirin 81 MG EC tablet Take 1 tablet (81 mg total) by mouth daily. 10/21/12  Yes Turner, Cornelious Bryant, MD  Calcium Carb-Cholecalciferol (CALCIUM + VITAMIN D3 PO) Take 1 tablet by mouth in the morning.   Yes [provider]  carvedilol (COREG) 6.25 MG tablet Take 1 tablet by mouth twice daily 11/03/23  Yes Laurey Morale, MD  empagliflozin (JARDIANCE) 10 MG TABS tablet TAKE 1 TABLET BY MOUTH ONCE DAILY BEFORE BREAKFAST 03/17/23  Yes Laurey Morale, MD  ibandronate (BONIVA) 150 MG tablet Take 150 mg by mouth every 30 (thirty) days.   Yes [provider]  torsemide (DEMADEX) 20 MG tablet TAKE 2 TABLETS BY MOUTH ONCE DAILY ALTERNATING WITH TAKING 1 TABLET THE NEXT DAY 10/21/23  Yes Laurey Morale, MD  Vericiguat (VERQUVO) 2.5 MG TABS Take 1 tablet (2.5 mg total) by mouth daily. 05/26/23  Yes Laurey Morale, MD     Family History  Problem Relation Age of Onset   Breast cancer Mother 28   Heart disease Father    Breast cancer Sister 69   Colon cancer Neg Hx    Esophageal cancer Neg Hx    Rectal cancer Neg Hx    Stomach cancer Neg Hx    Thyroid disease Neg Hx     Social History   Socioeconomic History   Marital  status: Single    Spouse name: Not on file   Number of children: Not on file   Years of education: Not on file   Highest education level: Not on file  Occupational History   Occupation: Retired    Comment: Textile, [place] [employer]  Tobacco Use   Smoking status: Never   Smokeless tobacco: Never  Vaping Use   Vaping status: Never Used  Substance and Sexual Activity   Alcohol use: No    Alcohol/week: 0.0 standard drinks of alcohol   Drug use: No   Sexual activity: Not on file  Other Topics Concern   Not on file  Social History Narrative   Not on file   Social Drivers of Health   Financial Resource Strain: Not on file  Food Insecurity: No Food Insecurity (11/30/2023)   Hunger Vital Sign    Worried About Running Out of Food in the Last Year: Never true    Ran Out of Food in the Last Year: Never true  Transportation Needs:  No Transportation Needs (11/30/2023)   PRAPARE - Administrator, Civil Service (Medical): No    Lack of Transportation (Non-Medical): No  Physical Activity: Not on file  Stress: Not on file  Social Connections: Moderately Integrated (11/30/2023)   Social Connection and Isolation Panel [NHANES]    Frequency of Communication with Friends and Family: More than three times a week    Frequency of Social Gatherings with Friends and Family: Three times a week    Attends Religious Services: More than 4 times per year    Active Member of Clubs or Organizations: Yes    Attends Banker Meetings: More than 4 times per year    Marital Status: Separated    Review of Systems Denies any N/V, chest pain, shortness of breath, fevers/chills. All other ROS negative.  Vital Signs: BP (!) 100/48   Pulse 73   Temp 97.6 F (36.4 C) (Oral)   Resp 18   Ht 5\' 3"  (1.6 m)   Wt 174 lb 9.7 oz (79.2 kg) Comment: Pillows subtracted from bed weight  SpO2 93%   BMI 30.93 kg/m   Advance Care Plan: The advanced care plan/surrogate decision maker  was discussed at the time of visit and documented in the medical record.    Physical Exam Vitals reviewed.  Constitutional:      Appearance: Normal appearance.  HENT:     Head: Normocephalic and atraumatic.     Mouth/Throat:     Mouth: Mucous membranes are moist.     Pharynx: Oropharynx is clear.  Cardiovascular:     Rate and Rhythm: Normal rate and regular rhythm.  Pulmonary:     Effort: Pulmonary effort is normal.     Breath sounds: Examination of the right-lower field reveals decreased breath sounds. Examination of the left-lower field reveals decreased breath sounds. Decreased breath sounds (L>R) present.     Comments: On 3L [place] Abdominal:     General: Abdomen is flat.     Palpations: Abdomen is soft.  Musculoskeletal:     Cervical back: Normal range of motion.  Skin:    General: Skin is warm and dry.  Neurological:     General: No focal deficit present.     Mental Status: She is alert and oriented to person, place, and time. Mental status is at baseline.  Psychiatric:        Mood and Affect: Mood normal.        Behavior: Behavior normal.        Judgment: Judgment normal.     Imaging: DG Chest Port 1 View Result Date: 12/20/2023 CLINICAL DATA:  Atelectasis. EXAM: PORTABLE CHEST 1 VIEW COMPARISON:  12/17/2023 FINDINGS: Again noted is enlargement of the cardiac silhouette. Increased densities at the left lung base could represent more consolidation or atelectasis. Difficult to exclude left pleural fluid. Right lung remains clear. Feeding tube extends down the esophagus and into the stomach but the tip is beyond the image. Stable position of the right jugular dialysis catheter with the tip near the superior cavoatrial junction. Post cardiac surgery changes are noted. IMPRESSION: 1. Slightly increased densities at the left lung base. Findings could represent atelectasis or consolidation. 2. Stable enlargement of the cardiac silhouette. 3. Stable support apparatus. Electronically  Signed   By: Richarda Overlie M.D.   On: 12/20/2023 09:07   CARDIAC CATHETERIZATION Result Date: 12/17/2023 1. Low filling pressures. 2. Mild pulmonary artery hypertension 3. Low cardiac output, CI 1.74 by Fick. 4. Preserved  PAPi I will restart milrinone at 0.125 mcg/kg/min   DG CHEST PORT 1 VIEW Result Date: 12/17/2023 CLINICAL DATA:  Follow-up pulmonary edema. EXAM: PORTABLE CHEST 1 VIEW COMPARISON:  12/14/2023 and 12/13/2023. FINDINGS: Moderate enlargement of the cardiac silhouette, unchanged. Right internal jugular central venous line and enteric tube unchanged. Left lung base opacity partly obscures hemidiaphragm, mildly improved from the previous exam. Remainder of the lungs is clear. No pneumothorax. IMPRESSION: 1. Mild improvement in left lung base opacity from the most recent prior exam. No other change. No new lung abnormalities. 2. Stable changes from cardiac surgery and valve replacement. 3. Stable remaining support apparatus. Electronically Signed   By: Amie Portland M.D.   On: 12/17/2023 09:46   DG CHEST PORT 1 VIEW Result Date: 12/14/2023 CLINICAL DATA:  Central line placement EXAM: PORTABLE CHEST 1 VIEW COMPARISON:  12/14/2023 FINDINGS: Right dialysis catheter placement with the tip at the cavoatrial junction. No pneumothorax. Cardiomegaly with vascular congestion. Left lower lobe retrocardiac opacity is unchanged. No confluent opacity on the right. IMPRESSION: Right central line placement with the tip at the cavoatrial junction. No pneumothorax. Cardiomegaly, vascular congestion. Stable left lower lobe atelectasis or infiltrate. Electronically Signed   By: Charlett Nose M.D.   On: 12/14/2023 10:28   ECHOCARDIOGRAM COMPLETE Result Date: 12/14/2023    ECHOCARDIOGRAM REPORT   Patient Name:   Vanessa [name] Date of Exam: 12/14/2023 Medical Rec #:  [id number]        Height:       63.0 in Accession #:    [id number]       Weight:       181.4 lb Date of Birth:  1951/10/16         BSA:          1.855 m  Patient Age:    72 years         BP:           93/60 mmHg Patient Gender: F                HR:           95 bpm. Exam Location:  Inpatient Procedure: 2D Echo, Color Doppler, Cardiac Doppler and Intracardiac            Opacification Agent Indications:    I31.3 Pericardial effusion  History:        Patient has prior history of Echocardiogram examinations, most                 recent 12/10/2023. CHF, Pulmonary HTN; Risk Factors:Hypertension.                 30mm TriRing T30 placed 11/29/23.                  Mitral Valve: 31 mm Medtronic bioprosthetic valve valve is                 present in the mitral position. Procedure Date: 11/29/23.  Sonographer:    Irving Burton Senior RDCS Referring Phys: [id number] LINDSAY NICOLE [facility] IMPRESSIONS  1. Left ventricular ejection fraction, by estimation, is 35 to 40%. The left ventricle has moderately decreased function. The left ventricle demonstrates regional wall motion abnormalities (see scoring diagram/findings for description). There is mild asymmetric left ventricular hypertrophy of the infero-lateral segment. Left ventricular diastolic function could not be evaluated.  2. Right ventricular systolic function is moderately reduced. The right ventricular size is moderately enlarged. There is severely elevated pulmonary artery  systolic pressure. The estimated right ventricular systolic pressure is 64.8 mmHg.  3. Left atrial size was moderately dilated.  4. The mitral valve has been repaired/replaced. Trivial mitral valve regurgitation. Moderate mitral stenosis. The mean mitral valve gradient is 6.0 mmHg with average heart rate of 90 bpm. There is a 31 mm Medtronic bioprosthetic valve present in the mitral position. Procedure Date: 11/29/23.  5. TriRing T30 implanted 11/29/23, echo findings are consistent with normal structure and central tricuspid regurgitation. Tricuspid valve gradient 1.4 mm HG at heart rate of 88 bpm. The tricuspid valve is has been repaired/replaced. Tricuspid valve  regurgitation is mild to moderate.  6. The aortic valve is tricuspid. Aortic valve regurgitation is trivial. Mild aortic valve stenosis. Aortic valve mean gradient measures 16.0 mmHg. Aortic valve Vmax measures 2.71 m/s.  7. The inferior vena cava is dilated in size with <50% respiratory variability, suggesting right atrial pressure of 15 mmHg. Comparison(s): No prior Echocardiogram. LV Septal bounce and IVC plethora remain. FINDINGS  Left Ventricle: Left ventricular ejection fraction, by estimation, is 35 to 40%. The left ventricle has moderately decreased function. The left ventricle demonstrates regional wall motion abnormalities. Definity contrast agent was given IV to delineate the left ventricular endocardial borders. The left ventricular internal cavity size was normal in size. There is mild asymmetric left ventricular hypertrophy of the infero-lateral segment. Left ventricular diastolic function could not be evaluated due to  mitral valve replacement. Left ventricular diastolic function could not be evaluated.  LV Wall Scoring: The basal inferior segment is akinetic. The anterior septum, mid inferoseptal segment, mid inferior segment, and basal inferoseptal segment are hypokinetic. The entire anterior wall, entire lateral wall, and entire apex are normal. Right Ventricle: The right ventricular size is moderately enlarged. No increase in right ventricular wall thickness. Right ventricular systolic function is moderately reduced. There is severely elevated pulmonary artery systolic pressure. The tricuspid regurgitant velocity is 3.53 m/s, and with an assumed right atrial pressure of 15 mmHg, the estimated right ventricular systolic pressure is 64.8 mmHg. Left Atrium: Left atrial size was moderately dilated. Right Atrium: Right atrial size was normal in size. Pericardium: There is no evidence of pericardial effusion. Mitral Valve: The mitral valve has been repaired/replaced. Trivial mitral valve regurgitation.  There is a 31 mm Medtronic bioprosthetic valve present in the mitral position. Procedure Date: 11/29/23. Moderate mitral valve stenosis. MV peak gradient, 14.1  mmHg. The mean mitral valve gradient is 6.0 mmHg with average heart rate of 90 bpm. Tricuspid Valve: TriRing T30 implanted 11/29/23, echo findings are consistent with normal structure and central tricuspid regurgitation. Tricuspid valve gradient 1.4 mm HG at heart rate of 88 bpm. The tricuspid valve is has been repaired/replaced. Tricuspid valve regurgitation is mild to moderate. No evidence of tricuspid stenosis. Aortic Valve: The aortic valve is tricuspid. Aortic valve regurgitation is trivial. Mild aortic stenosis is present. Aortic valve mean gradient measures 16.0 mmHg. Aortic valve peak gradient measures 29.4 mmHg. Aortic valve area, by VTI measures 0.76 cm. Pulmonic Valve: The pulmonic valve was normal in structure. Pulmonic valve regurgitation is trivial. No evidence of pulmonic stenosis. Aorta: The aortic root and ascending aorta are structurally normal, with no evidence of dilitation. Pulmonary Artery: The pulmonary artery is of normal size. Venous: The inferior vena cava is dilated in size with less than 50% respiratory variability, suggesting right atrial pressure of 15 mmHg. IAS/Shunts: No atrial level shunt detected by color flow Doppler.  LEFT VENTRICLE PLAX 2D LVIDd:  4.70 cm   Diastology LVIDs:         3.80 cm   LV e' medial:    6.37 cm/s LV PW:         1.30 cm   LV E/e' medial:  29.7 LV IVS:        0.90 cm   LV e' lateral:   9.64 cm/s LVOT diam:     1.70 cm   LV E/e' lateral: 19.6 LV SV:         31 LV SV Index:   17 LVOT Area:     2.27 cm  RIGHT VENTRICLE RV S prime:     9.48 cm/s TAPSE (M-mode): 1.0 cm LEFT ATRIUM             Index        RIGHT ATRIUM           Index LA diam:        4.00 cm 2.16 cm/m   RA Area:     12.10 cm LA Vol (A2C):   59.8 ml 32.23 ml/m  RA Volume:   27.00 ml  14.55 ml/m LA Vol (A4C):   80.2 ml 43.23  ml/m LA Biplane Vol: 74.6 ml 40.21 ml/m  AORTIC VALVE AV Area (Vmax):    0.86 cm AV Area (Vmean):   0.86 cm AV Area (VTI):     0.76 cm AV Vmax:           271.00 cm/s AV Vmean:          178.500 cm/s AV VTI:            0.405 m AV Peak Grad:      29.4 mmHg AV Mean Grad:      16.0 mmHg LVOT Vmax:         102.13 cm/s LVOT Vmean:        67.667 cm/s LVOT VTI:          0.136 m LVOT/AV VTI ratio: 0.34  AORTA Ao Root diam: 2.80 cm Ao Asc diam:  3.30 cm MITRAL VALVE                TRICUSPID VALVE MV Area (PHT): 2.07 cm     TV Peak grad:   4.0 mmHg MV Area VTI:   0.82 cm     TV Mean grad:   1.0 mmHg MV Peak grad:  14.1 mmHg    TV Vmax:        1.00 m/s MV Mean grad:  6.0 mmHg     TV Vmean:       55.3 cm/s MV Vmax:       1.88 m/s     TV VTI:         0.26 msec MV Vmean:      99.6 cm/s    TR Peak grad:   49.8 mmHg MV Decel Time: 366 msec     TR Vmax:        353.00 cm/s MV E velocity: 189.00 cm/s                             SHUNTS                             Systemic VTI:  0.14 m  Systemic Diam: 1.70 cm Riley Lam MD Electronically signed by Riley Lam MD Signature Date/Time: 12/14/2023/10:23:48 AM    Final    DG CHEST PORT 1 VIEW Result Date: 12/14/2023 CLINICAL DATA:  Hypoxia EXAM: PORTABLE CHEST 1 VIEW COMPARISON:  12/11/2023 FINDINGS: Cardiomegaly, vascular congestion. Left lower lobe airspace opacity again noted, unchanged. No confluent opacity on the right. No effusions or acute bony abnormality. IMPRESSION: Cardiomegaly. Stable left lower lobe retrocardiac atelectasis or infiltrate. Electronically Signed   By: Charlett Nose M.D.   On: 12/14/2023 09:33   CT CHEST ABDOMEN PELVIS WO CONTRAST Result Date: 12/13/2023 CLINICAL DATA:  Sepsis EXAM: CT CHEST, ABDOMEN AND PELVIS WITHOUT CONTRAST TECHNIQUE: Multidetector CT imaging of the chest, abdomen and pelvis was performed following the standard protocol without IV contrast. RADIATION DOSE REDUCTION: This exam was performed  according to the departmental dose-optimization program which includes automated exposure control, adjustment of the mA and/or kV according to patient size and/or use of iterative reconstruction technique. COMPARISON:  CT chest dated 11/15/2023. FINDINGS: CT CHEST FINDINGS Cardiovascular: Mild cardiomegaly. Interval mitral and tricuspid valve replacement. Moderate hemorrhagic pericardial effusion, new, with focal 5.3 cm hematoma along the right heart border (coronal image 46) with adjacent tiny foci of pericardial gas (series 3/image 26), likely postprocedural. Interval placement of a left IJ venous catheter terminating along the right lateral aspect of the lower SVC. No evidence of thoracic aortic aneurysm. Atherosclerotic calcifications of the aortic arch. Mediastinum/Nodes: No suspicious mediastinal lymphadenopathy. Markedly enlarged left thyroid gland with thyroid goiter. Lungs/Pleura: Patchy bilateral lower lobe opacities, likely atelectasis. Additional mild dependent atelectasis in the posterior right upper lobe. No focal consolidation. No suspicious pulmonary nodules. No pleural effusion or pneumothorax. Musculoskeletal: Thoracic spine is within normal limits. Median sternotomy. CT ABDOMEN PELVIS FINDINGS Hepatobiliary: Unenhanced liver is unremarkable. Layering gallstones (series 3/image 86), without associated inflammatory changes. Pancreas: Within normal limits. Spleen: Within normal limits. Adrenals/Urinary Tract: Adrenal glands are within normal limits. Right kidney is within normal limits. 1.6 cm simple cyst in the anterior left upper kidney (series 3/image 82), benign (Bosniak I). No follow-up is recommended. No hydronephrosis. Bladder is underdistended and poorly evaluated. Stomach/Bowel: Enteric tube terminates in the proximal duodenum. No evidence of bowel obstruction. Normal appendix (series 3/image 81). No colonic wall thickening or inflammatory changes. Vascular/Lymphatic: No evidence of  abdominal aortic aneurysm. No suspicious abdominopelvic lymphadenopathy. Reproductive: Calcified uterine fibroids. Bilateral ovaries are within normal limits. Other: No abdominopelvic ascites. Musculoskeletal: Mild degenerative changes of the lumbar spine. IMPRESSION: Interval mitral and tricuspid valve replacement. Moderate hemorrhagic pericardial effusion, new, with focal 5.3 cm hematoma along the right heart border, likely postprocedural. Consider echocardiography for further evaluation, as clinically warranted. Patchy bilateral lower lobe opacities, likely atelectasis. No acute findings in the abdomen/pelvis. Additional ancillary findings as above. Electronically Signed   By: Charline Bills M.D.   On: 12/13/2023 23:19   DG Chest Port 1 View Result Date: 12/11/2023 CLINICAL DATA:  Pneumothorax EXAM: PORTABLE CHEST 1 VIEW COMPARISON:  Same day chest x-ray FINDINGS: Previously seen right central line has been removed. Left IJ central venous catheter is stable in positioning. Enteric tube courses below the diaphragm with distal tip beyond the inferior margin of the film. Stable cardiomegaly status post sternotomy and cardiac valve replacement. No new focal airspace consolidation. No pneumothorax is seen. IMPRESSION: 1. Interval removal of right central line. No pneumothorax. 2. Stable cardiomegaly. Electronically Signed   By: Duanne Guess D.O.   On: 12/11/2023 16:22   DG CHEST PORT  1 VIEW Result Date: 12/11/2023 CLINICAL DATA:  Encounter for central line placement. EXAM: PORTABLE CHEST 1 VIEW COMPARISON:  One-view chest x-ray 12/10/2023 FINDINGS: The heart is enlarged. Atherosclerotic calcifications are present at the aortic arch. Left pleural effusion and retrocardiac airspace disease is again noted. Mild pulmonary vascular congestion is stable. A left IJ line terminates in the innominate vein. Right IJ sheath is stable. A new right subclavian line is in place. The proximal aspect of the line is  coiled, presumably outside of the patient. The tip is directed cephalad into the internal jugular vein. No pneumothorax is present. The visualized soft tissues and bony thorax are unremarkable. IMPRESSION: 1. New right subclavian line. The proximal aspect of the line is coiled, presumably outside of the patient. The tip is directed cephalad into the internal jugular vein. 2. Stable left pleural effusion and retrocardiac airspace disease. 3. Stable cardiomegaly and mild pulmonary vascular congestion. These results were called by telephone at the time of interpretation on 12/11/2023 at 11:35 am to provider [facility] , who verbally acknowledged these results. Electronically Signed   By: Marin Roberts M.D.   On: 12/11/2023 12:11   DG Chest 1V REPEAT Same Day Result Date: 12/11/2023 CLINICAL DATA:  Central line placement. EXAM: CHEST - 1 VIEW SAME DAY COMPARISON:  Earlier same day FINDINGS: The tip of the right subclavian central line appears to have been repositioned in the interval. The course of the catheter is atypical in the tip projects over the right lung apex. This could potentially be in the right internal jugular vein. There is some lucency in the right apex without a discrete pleural line visible, similar to yesterday's chest x-ray. Right IJ sheath has been removed in the interval. Left IJ central line tip projects over the proximal SVC level. A feeding tube passes into the stomach although the distal tip position is not included on the film. The cardio pericardial silhouette is enlarged. There is pulmonary vascular congestion without overt pulmonary edema. Retrocardiac left base atelectasis or infiltrate with probable small left effusion is similar to prior. IMPRESSION: 1. Interval repositioning of the right subclavian central line. The course of the catheter is atypical and the tip projects over the right lung apex. This could potentially be in the right internal jugular vein although catheter  tip position cannot be definitively confirmed on a single AP projection of the chest. 2. Persistent lucency in the right apex without a discrete pleural line visible to confirm pneumothorax. If there is clinical concern for pneumothorax, right-side-up decubitus film could be used to further evaluate. 3. No other significant interval change. Electronically Signed   By: Kennith Center M.D.   On: 12/11/2023 11:34   ECHOCARDIOGRAM LIMITED Result Date: 12/10/2023    ECHOCARDIOGRAM LIMITED REPORT   Patient Name:   Vanessa [name] Date of Exam: 12/10/2023 Medical Rec #:  [id number]        Height:       63.0 in Accession #:    [id number]       Weight:       170.0 lb Date of Birth:  [birth year]         BSA:          1.804 m Patient Age:    72 years         BP:           124/59 mmHg Patient Gender: F  HR:           63 bpm. Exam Location:  Inpatient Procedure: Limited Echo, Limited Color Doppler and Intracardiac Opacification            Agent Indications:    Dilated cardiomyopathy I42.0  History:        Patient has prior history of Echocardiogram examinations, most                 recent 12/01/2023. Cardiomyopathy and CHF, Pulmonary HTN,                 Arrythmias:PVC; Risk Factors:Hypertension.  Sonographer:    Lucendia Herrlich RCS Referring Phys: [id number] Vinnie Level SMITH IMPRESSIONS  1. Left ventricular ejection fraction, by estimation, is 40 to 45%. The left ventricle has mildly decreased function. The left ventricle demonstrates global hypokinesis.  2. Right ventricular systolic function is severely reduced. The right ventricular size is normal.  3. Left atrial size was moderately dilated.  4. There is turbulence over the prosthetic MV prosthesis but gradients not measured. Overall valve appears to be functioning well. The mitral valve has been repaired/replaced. Trivial mitral valve regurgitation. MV gradients not measured on this limited  study to assess for mitral stenosis.  5. The tricuspid valve is has been  repaired/replaced. Tricuspid valve regurgitation is not demonstrated.  6. The aortic valve is normal in structure. There is mild calcification of the aortic valve. Aortic valve regurgitation is not visualized. Aortic valve sclerosis/calcification is present, without any evidence of aortic stenosis.  7. The inferior vena cava is dilated in size with <50% respiratory variability, suggesting right atrial pressure of 15 mmHg. Conclusion(s)/Recommendation(s): Pericardial effusion has resolved. However there is still abnormal septal bounce and a markedly dilated IVC. Would consider pericardial constriction. Suggest cMRI to furher evalaute, if clinically indicated. FINDINGS  Left Ventricle: Left ventricular ejection fraction, by estimation, is 40 to 45%. The left ventricle has mildly decreased function. The left ventricle demonstrates global hypokinesis. Definity contrast agent was given IV to delineate the left ventricular  endocardial borders. The left ventricular internal cavity size was normal in size. There is no left ventricular hypertrophy. Abnormal (paradoxical) septal motion, consistent with left bundle branch block. Right Ventricle: The right ventricular size is normal. No increase in right ventricular wall thickness. Right ventricular systolic function is severely reduced. Left Atrium: Left atrial size was moderately dilated. Right Atrium: Right atrial size was normal in size. Pericardium: There is no evidence of pericardial effusion. Mitral Valve: There is turbulence over the prosthetic MV prosthesis but gradients not measured. Overall valve appears to be functioning well. The mitral valve has been repaired/replaced. Trivial mitral valve regurgitation. There is a 31 mm bioprosthetic valve present in the mitral position. MV gradients not measured on this limited study to assess for mitral valve stenosis. Tricuspid Valve: The tricuspid valve is has been repaired/replaced. Tricuspid valve regurgitation is not  demonstrated. No evidence of tricuspid stenosis. Aortic Valve: The aortic valve is normal in structure. There is mild calcification of the aortic valve. Aortic valve regurgitation is not visualized. Aortic valve sclerosis/calcification is present, without any evidence of aortic stenosis. Pulmonic Valve: The pulmonic valve was normal in structure. Pulmonic valve regurgitation is not visualized. No evidence of pulmonic stenosis. Aorta: The aortic root is normal in size and structure. Venous: The inferior vena cava is dilated in size with less than 50% respiratory variability, suggesting right atrial pressure of 15 mmHg. IAS/Shunts: No atrial level shunt detected by color flow Doppler.  LEFT VENTRICLE PLAX 2D LVIDd:         4.80 cm LVIDs:         4.30 cm LV PW:         0.90 cm LV IVS:        0.90 cm LVOT diam:     1.70 cm LVOT Area:     2.27 cm  IVC IVC diam: 2.20 cm LEFT ATRIUM         Index LA diam:    3.80 cm 2.11 cm/m   AORTA Ao Root diam: 2.80 cm Ao Asc diam:  3.30 cm TRICUSPID VALVE TR Peak grad:   44.1 mmHg TR Vmax:        332.00 cm/s  SHUNTS Systemic Diam: 1.70 cm Arvilla Meres MD Electronically signed by Arvilla Meres MD Signature Date/Time: 12/10/2023/11:24:48 AM    Final    DG Chest Port 1 View Result Date: 12/10/2023 CLINICAL DATA:  73 year old female status post cardiac surgery last month. "Pneumonia". EXAM: PORTABLE CHEST 1 VIEW COMPARISON:  Portable chest 12/07/2023 and earlier. FINDINGS: Portable AP semi upright view at 0714 hours. Enteric feeding tube courses to the abdomen as before, tip not included. Left subclavian and right IJ vascular catheters are unchanged. Stable cardiomegaly and mediastinal contours. Prosthetic cardiac valves. Stable mediastinal contours. Left lung base hypo ventilation appears unchanged. No air bronchograms identified. No pneumothorax or pulmonary edema. Right lung base is negative. Stable visualized osseous structures. IMPRESSION: 1.  Stable lines and tubes. 2.  Cardiopulmonary with prosthetic cardiac valves. Unchanged left lung base hypo ventilation. No pneumothorax or pulmonary edema identified. Electronically Signed   By: Odessa Fleming M.D.   On: 12/10/2023 09:35   [place] EKG SITE RITE Result Date: 12/10/2023 If Site Rite image not attached, placement could not be confirmed due to current cardiac rhythm.  US Abdomen Limited RUQ (LIVER/GB) Result Date: 12/09/2023 CLINICAL DATA:  Hyperbilirubinemia. EXAM: ULTRASOUND ABDOMEN LIMITED RIGHT UPPER QUADRANT COMPARISON:  None Available. FINDINGS: Gallbladder: Dilated gallbladder. No shadowing stones, wall thickening or adjacent fluid. Common bile duct: Diameter: 3 mm Liver: No focal lesion identified. Within normal limits in parenchymal echogenicity. Portal vein is patent on color Doppler imaging with normal direction of blood flow towards the liver. Other: None. IMPRESSION: Dilated gallbladder but no wall thickening, fluid or stones. No biliary ductal dilatation. Electronically Signed   By: Karen Kays M.D.   On: 12/09/2023 15:50   Port CXR Result Date: 12/07/2023 CLINICAL DATA:  Line placement EXAM: PORTABLE CHEST 1 VIEW COMPARISON:  X-ray 12/06/2023 and older. FINDINGS: Film is rotated to the left. Enlarged cardiopericardial silhouette with sternal wires. Vascular congestion. Persistent left retrocardiac opacity. No pneumothorax. Overlapping cardiac leads. Double-lumen left subclavian line with the tip overlying the SVC. Feeding tube extending beneath the diaphragm. Tip not included in the imaging field. Right IJ Swan-Ganz catheter with the tip overlying the left pulmonary artery. IMPRESSION: Stable tubes and lines. Enlarged heart with vascular congestion. Persistent left retrocardiac opacity. Rotated radiograph Electronically Signed   By: Karen Kays M.D.   On: 12/07/2023 17:50   DG CHEST PORT 1 VIEW Result Date: 12/06/2023 CLINICAL DATA:  Follow-up intubated patient. Status post cardiac surgery. EXAM: PORTABLE CHEST 1 VIEW  COMPARISON:  12/04/2023 and 12/03/2023. FINDINGS: Cardiopericardial silhouette remains enlarged, but unchanged. No mediastinal widening. Vascular congestion and bilateral interstitial thickening. Opacities noted in the left lower lung obscuring the left hemidiaphragm. Possible small left pleural effusion. No pneumothorax. Endotracheal tube has been removed. Right internal  jugular Swan-Ganz catheter tip projects in the left main pulmonary artery. Dual lumen left subclavian central venous catheter tip projects in the lower superior vena cava. Enteric tube passes well below the diaphragm and below the included field of view. IMPRESSION: 1. Vascular congestion and interstitial thickening appears increased from the most recent prior study consistent with mild pulmonary edema. 2. Status post extubation. 3. No other change. Stable enlargement of the cardiopericardial silhouette, but no mediastinal widening. 4. Remaining support apparatus is stable and well positioned. Electronically Signed   By: Amie Portland M.D.   On: 12/06/2023 08:29   DG CHEST PORT 1 VIEW Result Date: 12/04/2023 CLINICAL DATA:  Pericardial effusion. EXAM: PORTABLE CHEST 1 VIEW COMPARISON:  Chest radiograph dated 12/03/2023 FINDINGS: An endotracheal tube terminates 2.0 cm from the carina. A right internal jugular Swan-Ganz catheter appears unchanged. A left internal jugular central venous catheter tip overlies the superior vena cava. The heart is enlarged. Vascular calcifications are seen in the aortic arch. There is a left pleural effusion with associated atelectasis/airspace disease. The right lung is clear. No pneumothorax. IMPRESSION: Left pleural effusion with associated atelectasis/airspace disease. Electronically Signed   By: Romona Curls M.D.   On: 12/04/2023 11:42   DG Abd Portable 1V Result Date: 12/03/2023 CLINICAL DATA:  Feeding tube placement. EXAM: PORTABLE ABDOMEN - 1 VIEW COMPARISON:  None Available. FINDINGS: Tip of the weighted  enteric tube is in the mid abdomen in the region of the distal stomach. Question underlying gastric distension. Generalized paucity of upper abdominal bowel gas. IMPRESSION: Tip of the weighted enteric tube in the mid abdomen in the region of the distal stomach. Electronically Signed   By: Narda Rutherford M.D.   On: 12/03/2023 11:20   DG CHEST PORT 1 VIEW Result Date: 12/03/2023 CLINICAL DATA:  Pleural effusions. EXAM: PORTABLE CHEST 1 VIEW COMPARISON:  Radiograph 12/02/2023 FINDINGS: Endotracheal tube, NG tube Swan-Ganz catheter and LEFT central venous line are unchanged. Stable enlarged cardiac silhouette. Bilateral pleural effusions. Central venous congestion. No pneumothorax. IMPRESSION: 1. No significant change.  Stable support apparatus 2. Bilateral pleural effusions and central venous congestion. Electronically Signed   By: Genevive Bi M.D.   On: 12/03/2023 09:30   DG CHEST PORT 1 VIEW Result Date: 12/02/2023 CLINICAL DATA:  CHF intubated EXAM: PORTABLE CHEST 1 VIEW COMPARISON:  12/02/2023, 12/01/2023 FINDINGS: Interval intubation, tip of the endotracheal tube is at the carina. Esophageal tube tip in the left upper quadrant. Left-sided central venous catheter tip over the SVC. Right IJ Swan-Ganz catheter tip over the pulmonary artery confluence. Sternotomy changes. Similar cardiomegaly. Bilateral left greater than right pleural effusions and left basilar consolidation without significant change. IMPRESSION: 1. Interval intubation, tip of the endotracheal tube is at the carina. 2. Esophageal tube tip in the left upper quadrant of the abdomen 3. Otherwise no significant change in cardiomegaly, bilateral pleural effusions, and left basilar consolidation. Electronically Signed   By: Jasmine Pang M.D.   On: 12/02/2023 21:07   DG CHEST PORT 1 VIEW Result Date: 12/02/2023 CLINICAL DATA:  Catheter placement. EXAM: PORTABLE CHEST 1 VIEW COMPARISON:  Same day. FINDINGS: Interval placement of left internal  jugular dialysis catheter with distal tip in expected position of the SVC. No pneumothorax is noted. IMPRESSION: Interval placement of left internal jugular dialysis catheter with distal tip in expected position of the SVC. Electronically Signed   By: Lupita Raider M.D.   On: 12/02/2023 15:15   DG CHEST PORT 1 VIEW Result Date:  12/02/2023 CLINICAL DATA:  Congestive heart failure. EXAM: PORTABLE CHEST 1 VIEW COMPARISON:  Same day. FINDINGS: Stable cardiomegaly with mild central pulmonary vascular congestion. Status post cardiac valve repair. Right internal jugular Swan-Ganz catheter is noted with tip directed into left pulmonary artery. Small left pleural effusion is noted with associated atelectasis. Bony thorax is unremarkable. IMPRESSION: Stable cardiomegaly with mild central pulmonary vascular congestion. Small left pleural effusion is noted with associated left basilar atelectasis. Electronically Signed   By: Lupita Raider M.D.   On: 12/02/2023 15:14   DG Chest Port 1 View Result Date: 12/02/2023 CLINICAL DATA:  Shortness of breath EXAM: PORTABLE CHEST 1 VIEW COMPARISON:  12/01/2023 FINDINGS: Interval placement of Swan-Ganz catheter with tip of the proximal right pulmonary artery. No pneumothorax. Stable mediastinal drains. Sternal wires. Overlapping cardiac leads. Enlarged cardiopericardial silhouette with increasing vascular congestion. Moderate left effusion with the adjacent opacity. No pneumothorax. IMPRESSION: Placement of the Swan-Ganz catheter with the tip overlying the right pulmonary artery. No pneumothorax. Increasing vascular congestion Electronically Signed   By: Karen Kays M.D.   On: 12/02/2023 09:40   DG Chest Port 1 View Result Date: 12/01/2023 CLINICAL DATA:  Shortness of breath.  Post mitral valve replacement. EXAM: PORTABLE CHEST 1 VIEW COMPARISON:  Earlier same day; 11/30/2023; 11/29/2023 FINDINGS: Grossly unchanged enlarged cardiac silhouette and mediastinal contours given  differences in patient rotation. There is persistent rightward deviation of the tracheal air column at the level of the thoracic inlet. Interval retraction of pulmonary arterial central venous catheter with tip now projected over the right atrium. Sequela of previous median sternotomy and valve replacements. Redemonstrated mediastinal drains. No pneumothorax. Pulmonary vasculature remains indistinct with cephalization of flow. Unchanged trace right and small to moderate-sized left-sided effusions with associated bibasilar heterogeneous/consolidative opacities, left-greater-than-right. No acute osseous abnormalities. IMPRESSION: 1. Interval retraction of pulmonary arterial central venous catheter with tip now projected over the right atrium. 2. Otherwise, no significant interval change in the appearance of the chest including pulmonary edema, trace right and small to moderate-sized left-sided effusions with associated bibasilar atelectasis, left-greater-than-right. Electronically Signed   By: Simonne Come M.D.   On: 12/01/2023 16:28   ECHOCARDIOGRAM LIMITED Result Date: 12/01/2023    ECHOCARDIOGRAM LIMITED REPORT   Patient Name:   Vanessa [name] Date of Exam: 12/01/2023 Medical Rec #:  [id number]        Height:       63.0 in Accession #:    [id number]       Weight:       225.3 lb Date of Birth:  04/19/1951         BSA:          2.034 m Patient Age:    72 years         BP:           112/45 mmHg Patient Gender: F                HR:           79 bpm. Exam Location:  Inpatient Procedure: Limited Echo, Color Doppler and Cardiac Doppler Indications:    I31.3 Pericardial effusion (noninflammatory)  History:        Patient has prior history of Echocardiogram examinations, most                 recent 11/30/2023. CHF, Pulmonary HTN; Risk                 Factors:Hypertension.  Mitral Valve: 31 mm Medtronic bioprosthetic valve valve is                 present in the mitral position.  Sonographer:    Irving Burton Senior  RDCS Referring Phys: [id number] Eliot Ford [facility]  Sonographer Comments: Limited for effusion check IMPRESSIONS  1. Small pericardial effusion which appears anterior to the RV and posterior to the basal aspect of the LV. Difficult study on images obtained. RV appears dilated with reduced function but not well seen. IVC is dilated and does collapse >50%. Effusion appears similar to the study from the day prior. No evidence of diastolic RV collapse on this limited study. No overt features to suggest cardiac tamponade but limited study with difficult windows for TTE. a small pericardial effusion is present. The pericardial effusion is anterior to the right ventricle and posterior to the left ventricle.  2. Left ventricular ejection fraction, by estimation, is 40 to 45%. The left ventricle has mildly decreased function. There is the interventricular septum is flattened in systole and diastole, consistent with right ventricular pressure and volume overload.  3. Right ventricular systolic function was not well visualized. The right ventricular size is not well visualized.  4. There is a 31 mm Medtronic bioprosthetic valve present in the mitral position.  5. The tricuspid valve is has been repaired/replaced. The tricuspid valve is status post repair with an annuloplasty ring.  6. The aortic valve is tricuspid.  7. The inferior vena cava is dilated in size with <50% respiratory variability, suggesting right atrial pressure of 15 mmHg. Comparison(s): Changes from prior study are noted. Effusion appears similar to the prior study. LVEF has improved 40-45%. FINDINGS  Left Ventricle: Left ventricular ejection fraction, by estimation, is 40 to 45%. The left ventricle has mildly decreased function. The interventricular septum is flattened in systole and diastole, consistent with right ventricular pressure and volume overload. Right Ventricle: The right ventricular size is not well visualized. Right vetricular wall thickness was not well  visualized. Right ventricular systolic function was not well visualized. Pericardium: Small pericardial effusion which appears anterior to the RV and posterior to the basal aspect of the LV. Difficult study on images obtained. RV appears dilated with reduced function but not well seen. IVC is dilated and does collapse >50%. Effusion appears similar to the study from the day prior. No evidence of diastolic RV collapse on this limited study. No overt features to suggest cardiac tamponade but limited study with difficult windows for TTE. A small pericardial effusion is present. The pericardial effusion is anterior to the right ventricle and posterior to the left ventricle. Mitral Valve: There is a 31 mm Medtronic bioprosthetic valve present in the mitral position. Tricuspid Valve: The tricuspid valve is has been repaired/replaced. The tricuspid valve is status post repair with an annuloplasty ring. Aortic Valve: The aortic valve is tricuspid. Pulmonic Valve: The pulmonic valve was grossly normal. Venous: The inferior vena cava is dilated in size with less than 50% respiratory variability, suggesting right atrial pressure of 15 mmHg. Additional Comments: A venous catheter is visualized in the right ventricle. Spectral Doppler performed. Color Doppler performed.  Lennie Odor MD Electronically signed by Lennie Odor MD Signature Date/Time: 12/01/2023/2:59:38 PM    Final    DG Chest Port 1 View Result Date: 12/01/2023 CLINICAL DATA:  Status post mitral valve repair. EXAM: PORTABLE CHEST 1 VIEW COMPARISON:  11/30/2023 FINDINGS: Status post median sternotomy and mitral valve repair. Unchanged Swan-Ganz catheter with tip in the  right ventricular outflow tract. Mediastinal drains are in place, no pneumothorax visualized. Stable cardiac enlargement. Persistent moderate left pleural effusion and small right pleural effusion. Pulmonary vascular congestion. No pneumothorax. IMPRESSION: 1. Status post median sternotomy and  mitral valve repair. 2. Persistent moderate left pleural effusion and small right pleural effusion with pulmonary vascular congestion. Electronically Signed   By: Signa Kell M.D.   On: 12/01/2023 10:02   DG Chest Port 1 View Result Date: 11/30/2023 CLINICAL DATA:  Status post mitral valve replacement. EXAM: PORTABLE CHEST 1 VIEW COMPARISON:  November 29, 2023. FINDINGS: Stable cardiomegaly. Endotracheal and nasogastric tubes are unchanged in position. Right internal jugular Swan-Ganz catheter is unchanged. Stable bibasilar opacities are noted concerning for atelectasis and pleural effusions, left greater than right. Bony thorax is unremarkable. IMPRESSION: Stable support apparatus. Stable bibasilar opacities and effusions as noted above. Electronically Signed   By: Lupita Raider M.D.   On: 11/30/2023 10:19   ECHOCARDIOGRAM COMPLETE Result Date: 11/30/2023    ECHOCARDIOGRAM LIMITED REPORT   Patient Name:   Vanessa [name] Date of Exam: 11/30/2023 Medical Rec #:  [id number]        Height:       63.0 in Accession #:    [id number]       Weight:       206.8 lb Date of Birth:  [birth year]         BSA:          1.961 m Patient Age:    72 years         BP:           113/84 mmHg Patient Gender: F                HR:           66 bpm. Exam Location:  Inpatient Procedure: 2D Echo, Cardiac Doppler, Color Doppler and Intracardiac            Opacification Agent Indications:    Shock R57.9  History:        Patient has prior history of Echocardiogram examinations, most                 recent 12/15/2018. CHF and Cardiomyopathy, Pulmonary HTN; Risk                 Factors:Hypertension.  Sonographer:    Webb Laws Referring Phys: Lorin Glass IMPRESSIONS  1. Left ventricular ejection fraction, by estimation, is 25 to 30%. The left ventricle has severely decreased function. The left ventricular internal cavity size was mildly dilated. There is mild concentric left ventricular hypertrophy.  2. Right ventricular  systolic function is moderately reduced.  3. Left atrial size was mildly dilated.  4. There is a well seated 31mm bioprosthetic valve in the mitral position. There is trivial mitral valve regurgitation. There is no mitral stenosis or pathologic paravalvular regurgitation.  5. The tricuspid valve is has been repaired/replaced. The tricuspid valve is status post repair with an annuloplasty ring. Tricuspid valve regurgitation is mild.  6. The aortic valve is normal in structure. Aortic valve regurgitation is trivial. FINDINGS  Left Ventricle: Left ventricular ejection fraction, by estimation, is 25 to 30%. The left ventricle has severely decreased function. Definity contrast agent was given IV to delineate the left ventricular endocardial borders. The left ventricular internal cavity size was mildly dilated. There is mild concentric left ventricular hypertrophy. Right Ventricle: Right ventricular systolic function is moderately reduced. Left Atrium: Left  atrial size was mildly dilated. Right Atrium: Right atrial size was normal in size. Pericardium: There is no evidence of pericardial effusion. Mitral Valve: The mitral valve has been repaired/replaced. Trivial mitral valve regurgitation. There is a 31 mm bioprosthetic valve present in the mitral position. MV peak gradient, 10.9 mmHg. The mean mitral valve gradient is 4.0 mmHg. Tricuspid Valve: The tricuspid valve is has been repaired/replaced. Tricuspid valve regurgitation is mild. The tricuspid valve is status post repair with an annuloplasty ring. Aortic Valve: The aortic valve is normal in structure. Aortic valve regurgitation is trivial. Aortic valve mean gradient measures 11.3 mmHg. Aortic valve peak gradient measures 24.0 mmHg. Aortic valve area, by VTI measures 1.21 cm. Aorta: The aortic root is normal in size and structure. Venous: The inferior vena cava was not well visualized. LEFT VENTRICLE PLAX 2D LVIDd:         4.90 cm      Diastology LVIDs:         4.80  cm      LV e' medial:    4.03 cm/s LV PW:         0.90 cm      LV E/e' medial:  42.4 LV IVS:        1.30 cm      LV e' lateral:   6.85 cm/s LVOT diam:     1.60 cm      LV E/e' lateral: 25.0 LV SV:         52 LV SV Index:   27 LVOT Area:     2.01 cm                              3D Volume EF: LV Volumes (MOD)            3D EF:        36 % LV vol d, MOD A2C: 133.0 ml LV EDV:       163 ml LV vol d, MOD A4C: 135.0 ml LV ESV:       105 ml LV vol s, MOD A2C: 84.8 ml  LV SV:        58 ml LV vol s, MOD A4C: 87.5 ml LV SV MOD A2C:     48.2 ml LV SV MOD A4C:     135.0 ml LV SV MOD BP:      56.0 ml RIGHT VENTRICLE RV Basal diam:  3.30 cm RV S prime:     7.83 cm/s TAPSE (M-mode): 1.5 cm LEFT ATRIUM             Index        RIGHT ATRIUM          Index LA Vol (A2C):   51.0 ml 26.00 ml/m  RA Area:     8.62 cm LA Vol (A4C):   57.1 ml 29.11 ml/m  RA Volume:   15.10 ml 7.70 ml/m LA Biplane Vol: 56.4 ml 28.76 ml/m  AORTIC VALVE AV Area (Vmax):    1.39 cm AV Area (Vmean):   1.30 cm AV Area (VTI):     1.21 cm AV Vmax:           245.00 cm/s AV Vmean:          153.667 cm/s AV VTI:            0.434 m AV Peak Grad:      24.0 mmHg AV Mean Grad:  11.3 mmHg LVOT Vmax:         169.00 cm/s LVOT Vmean:        99.600 cm/s LVOT VTI:          0.261 m LVOT/AV VTI ratio: 0.60  AORTA Ao Root diam: 2.40 cm Ao Asc diam:  3.20 cm MITRAL VALVE                TRICUSPID VALVE MV Area (PHT): 3.37 cm     TR Peak grad:   25.4 mmHg MV Area VTI:   1.09 cm     TR Vmax:        252.00 cm/s MV Peak grad:  10.9 mmHg MV Mean grad:  4.0 mmHg     SHUNTS MV Vmax:       1.65 m/s     Systemic VTI:  0.26 m MV Vmean:      96.4 cm/s    Systemic Diam: 1.60 cm MV Decel Time: 225 msec MV E velocity: 171.00 cm/s MV A velocity: 93.10 cm/s MV E/A ratio:  1.84 Aditya Sabharwal Electronically signed by Dorthula Nettles Signature Date/Time: 11/30/2023/9:17:55 AM    Final    ECHO INTRAOPERATIVE TEE Result Date: 11/30/2023  *INTRAOPERATIVE TRANSESOPHAGEAL REPORT *   Patient Name:   Vanessa [name] Date of Exam: 11/29/2023 Medical Rec #:  [id number]        Height:       63.0 in Accession #:    [id number]       Weight:       183.0 lb Date of Birth:  November 29, 1951         BSA:          1.86 m Patient Age:    72 years         BP: Patient Gender: F                HR: Exam Location:  Anesthesiology Transesophogeal exam was perform intraoperatively during surgical procedure. Patient was closely monitored under general anesthesia during the entirety of examination. Performing Phys: Mariann Barter MD Diagnosing Phys: Roslynn Amble Complications: No known complications during this procedure. POST-OP IMPRESSIONS _ Left Ventricle: The left ventricle is unchanged from pre-bypass with moderately reduced systolic function; ejection fraction estimated at 40% on inotropic support with epinephrine and milrinone. _ Right Ventricle: normal function with inotropic support.. _ Aorta: The aorta appears unchanged from pre-bypass. There is no dissection present in the aorta. _ Aortic Valve: There is mild regurgitation. _ Mitral Valve: Status post bioprosthetic mitral valve replacement, with normal function of the prosthetic valve. The mean gradient recorded across the prosthetic valve is . No paravalvular leak visualized by color doppler. Trivial central intravalvular leak noted (Image 119). _ Tricuspid Valve: There is mild regurgitation. Normal post-repair gradient (1-42mmHg) _ Pulmonic Valve: The pulmonic valve appears unchanged from pre-bypass. _ Interatrial Septum: The interatrial septum appears unchanged from pre-bypass. _ Comments: Pericardium: A moderately sized density (likely clot) is noted in the pericardium anterior to the right atrium (image 137). No right atrial or right ventricular collapse. A small circumferential pericardial effusion is noted in transgastric views. These findings were relayed to the surgeon. PRE-OP FINDINGS  Left Ventricle: The left ventricle has moderately reduced systolic  function, with an ejection fraction of 35-40%. The cavity size was normal. Right Ventricle: The right ventricle has mildly reduced systolic function. The cavity was dialated. There is no increase in right ventricular wall thickness. Catheter present in the right ventricle. Left  Atrium: Left atrial size was dilated. No left atrial/left atrial appendage thrombus was detected. Right Atrium: Right atrial size was dilated. Right atrial pressure is estimated at 10 mmHg. Interatrial Septum: No atrial level shunt detected by color flow Doppler. Pericardium: There is no evidence of pericardial effusion. There is no pleural effusion. Mitral Valve: The mitral valve is dilated. Mitral valve regurgitation is severe by color flow Doppler. The MR jet is posteriorly-directed. There is Mild mitral stenosis. Tricuspid Valve: The tricuspid valve was dilated in appearance. Tricuspid valve regurgitation is moderate by color flow Doppler. No evidence of tricuspid stenosis is present. The tricuspid valve is status post repair with an annuloplasty ring. Aortic Valve: The aortic valve is normal in structure. Aortic valve regurgitation was not visualized by color flow Doppler. There is mild stenosis of the aortic valve, with a calculated valve area of 1.66 cm. Pulmonic Valve: The pulmonic valve was normal in structure. Pulmonic valve regurgitation is trivial by color flow Doppler. Aorta: The aortic root, ascending aorta and aortic arch are normal in size and structure. +--------------+--------++ LEFT VENTRICLE         +--------------+--------++ PLAX 2D                +--------------+--------++ LVOT diam:    2.20 cm  +--------------+--------++ LVOT Area:    3.80 cm +--------------+--------++                        +--------------+--------++ +---------------+------+-------+ RIGHT VENTRICLE              +---------------+------+-------+ TAPSE (M-mode):1.7 cm2.37 cm +---------------+------+-------+  +------------------+-----------++ AORTIC VALVE                  +------------------+-----------++ AV Area (Vmax):   1.35 cm    +------------------+-----------++ AV Area (Vmean):  1.54 cm    +------------------+-----------++ AV Area (VTI):    1.66 cm    +------------------+-----------++ AV Vmax:          176.50 cm/s +------------------+-----------++ AV Vmean:         99.500 cm/s +------------------+-----------++ AV VTI:           0.334 m     +------------------+-----------++ AV Peak Grad:     12.5 mmHg   +------------------+-----------++ AV Mean Grad:     5.5 mmHg    +------------------+-----------++ LVOT Vmax:        62.70 cm/s  +------------------+-----------++ LVOT Vmean:       40.400 cm/s +------------------+-----------++ LVOT VTI:         0.146 m     +------------------+-----------++ LVOT/AV VTI ratio:0.44        +------------------+-----------++ +-------------+---------++ +---------------+----------++ MITRAL VALVE           TRICUSPID VALVE           +-------------+---------++ +---------------+----------++ MV Peak grad:9.5 mmHg  TV Peak grad:  13.5 mmHg  +-------------+---------++ +---------------+----------++ MV Mean grad:4.0 mmHg  TV Mean grad:  1.0 mmHg   +-------------+---------++ +---------------+----------++ MV Vmax:     1.54 m/s  TV Vmax:       1.84 m/s   +-------------+---------++ +---------------+----------++ MV Vmean:    94.3 cm/s TV Vmean:      47.8 cm/s  +-------------+---------++ +---------------+----------++ MV VTI:      0.56 m    TV VTI:        0.13 msec  +-------------+---------++ +---------------+----------++  TR Peak grad:  1.5 mmHg                              +---------------+----------++                            TR Mean grad:  1.0 mmHg                              +---------------+----------++                            TR Vmax:       60.30 cm/s                             +---------------+----------++                            TR Vmean:      32.4 cm/s                             +---------------+----------++                             +--------------+-------+                            SHUNTS                                           +--------------+-------+                            Systemic VTI: 0.15 m                             +--------------+-------+                            Systemic Diam:2.20 cm                            +--------------+-------+  Roslynn Amble Electronically signed by Roslynn Amble Signature Date/Time: 11/30/2023/8:24:31 AM    Final    DG CHEST PORT 1 VIEW Result Date: 11/29/2023 CLINICAL DATA:  Mitral valve repair EXAM: PORTABLE CHEST 1 VIEW COMPARISON:  11/29/2023 FINDINGS: Single frontal view of the chest demonstrates endotracheal tube overlying tracheal air column, tip 4.5 cm above carina. Enteric catheter passes below diaphragm tip projecting over gastric body. Flow directed right internal jugular central venous catheter tip projects over the main pulmonary outflow tract. Mediastinal and pericardial drains again noted. Postsurgical changes are seen from mitral valve repair. Cardiac silhouette remains enlarged. Persistent left basilar consolidation and left pleural effusion, with slight improved aeration in the left lung since prior study. The right chest is clear. No pneumothorax. IMPRESSION: 1. Persistent left basilar consolidation and left pleural effusion, with slight improved aeration of the left lung since prior study. 2.  Support devices as above. Endotracheal tube tip now 4.5 cm above carina. 3. Stable enlargement of the cardiac silhouette. Electronically Signed   By: Sharlet Salina M.D.   On: 11/29/2023 19:57   ECHO INTRAOPERATIVE TEE Result Date: 11/29/2023  *INTRAOPERATIVE TRANSESOPHAGEAL REPORT *  Patient Name:   Vanessa [name] Date of Exam: 11/29/2023 Medical Rec #:  [id number]        Height:        63.0 in Accession #:    [id number]       Weight:       183.0 lb Date of Birth:  09/24/1951         BSA:          1.86 m Patient Age:    72 years         BP:           83/57 mmHg Patient Gender: F                HR:           111 bpm. Exam Location:  Anesthesiology Transesophogeal exam was perform intraoperatively during surgical procedure. Patient was closely monitored under general anesthesia during the entirety of examination. Indications:     R00.9* Unspecified abnormalities of heart beat Sonographer:     Sheralyn Boatman RDCS Performing Phys: [id number] Eugenio Hoes Diagnosing Phys: Gaynelle Adu MD Complications: No known complications during this procedure. POST-OP IMPRESSIONS _ Comments: Tamponade physiology has improved with the evacuation of blood from the pericardial space. All pressors have been weaned from the start of the case. PRE-OP FINDINGS  Left Ventricle: The left ventricle has mild-moderately reduced systolic function, with an ejection fraction of 40-45%. The cavity size was left ventricular size was not assessed. There is no increase in left ventricular wall thickness. Right Ventricle: The right ventricle has normal systolic function. The cavity was not assessed. There is right vetricular wall thickness was not assessed. Left Atrium: Left atrial size was not assessed. No left atrial/left atrial appendage thrombus was detected. Right Atrium: Right atrial size was not assessed. Interatrial Septum: The interatrial septum was not assessed. Pericardium: A moderately sized pericardial effusion is present. The pericardial effusion is localized near the right ventricle and localized near the right atrium. There is diastolic collapse of the right atrial wall and diastolic collapse of the right ventricular free wall. There is evidence of cardiac tamponade. Mitral Valve: Replaced. Mitral valve regurgitation is not visualized by color flow Doppler. Tricuspid Valve: The tricuspid valve Repaired. Tricuspid valve  regurgitation is trivial by color flow Doppler. Aortic Valve: The aortic valve is normal in structure. Aortic valve regurgitation was not visualized by color flow Doppler. Pulmonic Valve: The pulmonic valve was normal in structure. Pulmonic valve regurgitation was not assessed by color flow Doppler.  Gaynelle Adu MD Electronically signed by Gaynelle Adu MD Signature Date/Time: 11/29/2023/5:53:06 PM    Final    DG Chest Port 1 View Result Date: 11/29/2023 CLINICAL DATA:  Status post mitral valve replacement. EXAM: PORTABLE CHEST 1 VIEW COMPARISON:  November 25, 2023. FINDINGS: Endotracheal tube is directed into right mainstem bronchus. Withdrawal by 5-6 cm is recommended. Large left basilar opacity is noted most consistent with atelectasis secondary to endotracheal tube malposition. Small effusion may be present. Right internal jugular Swan-Ganz catheter is noted with tip directed into right pulmonary artery. IMPRESSION: Endotracheal tube is directed into right mainstem bronchus and most likely resulting in significant atelectasis of the left lung. Withdrawal by 5-6 cm  is recommended. These results will be called to the ordering clinician or representative by the Radiologist Assistant, and communication documented in the PACS or zVision Dashboard. Electronically Signed   By: Lupita Raider M.D.   On: 11/29/2023 14:20   EP STUDY Result Date: 11/29/2023 See surgical note for result.  DG Chest 2 View Result Date: 11/27/2023 CLINICAL DATA:  Severe mitral insufficiency. Tricuspid valve insufficiency. Preop chest exam. EXAM: CHEST - 2 VIEW COMPARISON:  Radiograph 09/01/2022.  CT 11/15/2023 FINDINGS: Unchanged cardiomegaly. Unchanged clips in the region of the mitral valve. Stable mediastinal contours. Aortic atherosclerosis. Rightward tracheal deviation due to enlarged left lobe of the thyroid gland. No pulmonary edema, pleural effusion, or pneumothorax. No acute osseous abnormalities. IMPRESSION:  1. Chronic cardiomegaly. 2. Chronic rightward tracheal deviation due to enlarged left thyroid gland. Electronically Signed   By: Narda Rutherford M.D.   On: 11/27/2023 23:57   VAS US CAROTID Result Date: 11/25/2023 Carotid Arterial Duplex Study Patient Name:  Vanessa [name]  Date of Exam:   11/25/2023 Medical Rec #: [id number]         Accession #:    [id number] Date of Birth: Feb 18, 1951          Patient Gender: F Patient Age:   23 years Exam Location:  [facility] Procedure:      VAS US CAROTID Referring Phys: Eugenio Hoes --------------------------------------------------------------------------------  Indications:       Mitral/Tricuspid regurgitation. Risk Factors:      Hypertension. Comparison Study:  None. Performing Technologist: Shona Simpson  Examination Guidelines: A complete evaluation includes B-mode imaging, spectral Doppler, color Doppler, and power Doppler as needed of all accessible portions of each vessel. Bilateral testing is considered an integral part of a complete examination. Limited examinations for reoccurring indications may be performed as noted.  Right Carotid Findings: +----------+--------+--------+--------+------------------+------------------+           PSV cm/sEDV cm/sStenosisPlaque DescriptionComments           +----------+--------+--------+--------+------------------+------------------+ CCA Prox  84      20                                                   +----------+--------+--------+--------+------------------+------------------+ CCA Distal53      14                                intimal thickening +----------+--------+--------+--------+------------------+------------------+ ICA Prox  38      10      1-39%   heterogenous                         +----------+--------+--------+--------+------------------+------------------+ ICA Distal65      22                                                    +----------+--------+--------+--------+------------------+------------------+ ECA       61      8                                                    +----------+--------+--------+--------+------------------+------------------+ +----------+--------+-------+--------+-------------------+  PSV cm/sEDV cmsDescribeArm Pressure (mmHG) +----------+--------+-------+--------+-------------------+ Subclavian122     0                                  +----------+--------+-------+--------+-------------------+ +---------+--------+--+--------+--+ VertebralPSV cm/s45EDV cm/s17 +---------+--------+--+--------+--+  Left Carotid Findings: +----------+--------+--------+--------+------------------+------------------+           PSV cm/sEDV cm/sStenosisPlaque DescriptionComments           +----------+--------+--------+--------+------------------+------------------+ CCA Prox  105     28                                                   +----------+--------+--------+--------+------------------+------------------+ CCA Distal55      18                                intimal thickening +----------+--------+--------+--------+------------------+------------------+ ICA Prox  46      14                                intimal thickening +----------+--------+--------+--------+------------------+------------------+ ICA Distal60      22                                                   +----------+--------+--------+--------+------------------+------------------+ ECA       65      9                                                    +----------+--------+--------+--------+------------------+------------------+ +----------+--------+--------+--------+-------------------+           PSV cm/sEDV cm/sDescribeArm Pressure (mmHG) +----------+--------+--------+--------+-------------------+ Subclavian89      23                                   +----------+--------+--------+--------+-------------------+ +---------+--------+--+--------+--+ VertebralPSV cm/s40EDV cm/s12 +---------+--------+--+--------+--+   Summary: Right Carotid: Velocities in the right ICA are consistent with a 1-39% stenosis. Left Carotid: The extracranial vessels were near-normal with only minimal wall               thickening or plaque. Vertebrals:  Bilateral vertebral arteries demonstrate antegrade flow. Subclavians: Normal flow hemodynamics were seen in bilateral subclavian              arteries. *See table(s) above for measurements and observations.  Electronically signed by Coral Else MD on 11/25/2023 at 2:19:47 PM.    Final     Labs:  CBC: Recent Labs    12/19/23 0448 12/20/23 0453 12/21/23 0459 12/22/23 0344  WBC 19.4* 18.8* 17.5* 15.0*  HGB 9.4* 8.5* 8.2* 8.3*  HCT 30.5* 28.0* 26.9* 26.8*  PLT 88* 86* 82* 93*    COAGS: Recent Labs    12/18/23 0419 12/19/23 0448 12/20/23 0453 12/21/23 0457  INR 1.2 1.2 1.3* 1.3*  APTT 61* 74* 74* 87*    BMP: Recent Labs    12/20/23 1634 12/21/23 0457  12/21/23 1700 12/22/23 0344  NA 132* 131* 128* 131*  K 4.4 4.7 5.2* 5.1  CL 100 97* 96* 94*  CO2 24 24 20* 23  GLUCOSE 149* 142* 127* 132*  BUN 19 34* 41* 51*  CALCIUM 8.1* 8.6* 8.8* 9.1  CREATININE 1.61* 2.77* 3.40* 4.18*  GFRNONAA 34* 18* 14* 11*    LIVER FUNCTION TESTS: Recent Labs    12/10/23 0339 12/10/23 1635 12/12/23 0939 12/13/23 0303 12/14/23 0240 12/16/23 0417 12/16/23 1601 12/20/23 1634 12/21/23 0457 12/21/23 1700 12/22/23 0344  BILITOT 13.5*  --  13.0* 11.9*  --  7.4*  --   --   --   --   --   AST 45*  --  41 39  --  35  --   --   --   --   --   ALT 34  --  44 44  --  45*  --   --   --   --   --   ALKPHOS 83  --  76 66  --  64  --   --   --   --   --   PROT 6.9  --  7.5 7.4  --  7.0  --   --   --   --   --   ALBUMIN 2.9*  2.9*   < > 2.9* 2.7*  2.8*   < > 2.6*   < > 2.3* 2.3* 2.5* 2.4*   < > = values in this  interval not displayed.    TUMOR MARKERS: No results for input(s): "AFPTM", "CEA", "CA199", "CHROMGRNA" in the last 8760 hours.  Assessment and Plan:  73 y.o. female with a history of multi-nodular goiter, HTN, CKD 3, non-ischemic cardiomyopathy, moderate tricuspid regurgitation, and mitral valve disease with worsening MR and DOE with ambulation s/p mitral valve replacement and tricuspid valve repair with Dr. Leafy Ro on 11/28/24. Found to have a large clot in the pericardium consistent with tamponade post-op in CCM and had to return to the OR. Nephrology consulted on 12/02/23 for AKI on CKD and fluid overload. Immediately started on CRRT with a temporary HD cath placed in critical care. Due to worsening leukocytosis on 12/12/23, the first temp cath was pulled and the patient had a line holiday. A new temp cath was placed in critical care on 12/14/23 and patient has been tolerating CRRT well. IR with request to convert temp cath to tunneled cath.   -WBC count currently 15.0 (trending down over past 2-3 days) -IR will continue to monitor daily WBC and plan for procedure when deemed appropriate per MD -Will make NPO night before procedure  -Currently on continuous Heparin infusion. Will discuss with RAD when to hold, if necessary  Risks and benefits discussed with the patient including, but not limited to bleeding, infection, vascular injury, pneumothorax which may require chest tube placement, air embolism or even death  All of the patient's questions were answered, patient is agreeable to proceed. Consent signed and in chart.   Thank you for this interesting consult. I greatly enjoyed meeting ASANI UNSWORTH and look forward to participating in their care. A copy of this report was sent to the requesting provider on this date.  Electronically Signed: Jama Flavors, PA-C 12/22/2023, 10:15 AM   I spent a total of 20 Minutes  in face to face clinical consultation, greater than 50% of which was  counseling/coordinating care for tunneled HD cath placement.

## 2023-12-22 NOTE — Progress Notes (Addendum)
Patient ID: Vanessa [name], female   DOB: 1951-02-20, 73 y.o.   MRN: [id number]      Advanced Heart Failure Rounding Note  Cardiologist: Dr. Shirlee Latch  Chief Complaint: Cardiogenic Shock  Subjective:   12/30 S/P MVR (tissue) and TV ring and same day return to OR for tamponade/bleeding. 1/2: Progressive volume overload with poor UOP, CVVH started but ended up re-intubated.   Extubated 12/05/23 1/12: CVVH stopped 1/13: iHD, HD line removed 1/14: CRRT Restarted.  1/17: RHC mean RA 2, PA 47/20, mean PCWP 5, CI 1.74, PVR 8, PAPi 9.  Milrinone 0.125 started. 1/20 CRRT stopped.   Remains off CRRT. Remains anuric. CVP 5-9. On higher pressor requirements, NE titrated up to 18 overnight. MAP 70s. Also on midodrine and droxidoa.  Co-ox 80%  FICK CO 6.1/ CI 3.2  SVR 774  K 5.1 BUN 51  OOB, sitting up in chair. No complaints. Denies resting dyspnea. No n/v.    Objective:   Weight Range: 79.2 kg Body mass index is 30.93 kg/m.   Vital Signs:   Temp:  [97.6 F (36.4 C)-98.3 F (36.8 C)] 97.9 F (36.6 C) (01/22 0315) Pulse Rate:  [69-83] 74 (01/22 0630) Resp:  [11-28] 16 (01/22 0630) BP: (72-147)/(34-121) 108/54 (01/22 0630) SpO2:  [86 %-100 %] 99 % (01/22 0630) Weight:  [79.2 kg] 79.2 kg (01/22 0500) Last BM Date : 12/21/23    Weight change: Filed Weights   12/19/23 0645 12/20/23 0623 12/22/23 0500  Weight: 73.3 kg 73.6 kg 79.2 kg   Intake/Output:   Intake/Output Summary (Last 24 hours) at 12/22/2023 0717 Last data filed at 12/22/2023 0600 Gross per 24 hour  Intake 1752.63 ml  Output --  Net 1752.63 ml    Physical Exam   General:  fatigued appearing, sitting up in chair. No respiratory difficulty HEENT: normal Neck: supple. JVD not elevated. + RIJ CVC, Carotids 2+ bilat; no bruits. No lymphadenopathy or thyromegaly appreciated. Cor: PMI nondisplaced. Regular rate & rhythm. No rubs, gallops or murmurs. Sternal wound ok  Lungs: decreased BS at the bases bilaterally   Abdomen: soft, nontender, nondistended. No hepatosplenomegaly. No bruits or masses. Good bowel sounds. Extremities: no cyanosis, clubbing, rash, 1+ b/l LE edema Neuro: alert & oriented x 3, cranial nerves grossly intact. moves all 4 extremities w/o difficulty. Affect pleasant.   Telemetry   SR 80-90s with occasional PVCs  Labs    CBC Recent Labs    12/21/23 0459 12/22/23 0344  WBC 17.5* 15.0*  HGB 8.2* 8.3*  HCT 26.9* 26.8*  MCV 105.9* 104.7*  PLT 82* 93*   Basic Metabolic Panel Recent Labs    88/41/66 0457 12/21/23 1700 12/22/23 0344  NA 131* 128* 131*  K 4.7 5.2* 5.1  CL 97* 96* 94*  CO2 24 20* 23  GLUCOSE 142* 127* 132*  BUN 34* 41* 51*  CREATININE 2.77* 3.40* 4.18*  CALCIUM 8.6* 8.8* 9.1  MG 2.6*  --  2.7*  PHOS 5.0* 6.0* 7.9*   Liver Function Tests Recent Labs    12/21/23 1700 12/22/23 0344  ALBUMIN 2.5* 2.4*   No results for input(s): "LIPASE", "AMYLASE" in the last 72 hours. Cardiac Enzymes No results for input(s): "CKTOTAL", "CKMB", "CKMBINDEX", "TROPONINI" in the last 72 hours.  BNP: BNP (last 3 results) Recent Labs    05/26/23 1610 08/26/23 1103 09/22/23 0905  BNP 778.0* 781.0* 926.0*   Other results:  Imaging   No results found.        Medications:  Scheduled Medications:  amiodarone  200 mg Oral BID   ascorbic acid  250 mg Per Tube BID   Chlorhexidine Gluconate Cloth  6 each Topical Daily   darbepoetin (ARANESP) injection - NON-DIALYSIS  40 mcg Subcutaneous Q Tue-1800   droxidopa  200 mg Per Tube TID WC   feeding supplement (VITAL AF 1.2 CAL)  660 mL Per Tube Q24H   fiber supplement (BANATROL TF)  60 mL Per Tube QID   Gerhardt's butt cream   Topical BID   influenza vaccine adjuvanted  0.5 mL Intramuscular Tomorrow-1000   insulin aspart  0-24 Units Subcutaneous Q4H   midodrine  20 mg Per Tube Q8H   multivitamin  1 tablet Per Tube BID   QUEtiapine  25 mg Oral QHS   sertraline  25 mg Oral Daily   zinc sulfate (50mg   elemental zinc)  220 mg Per Tube Daily    Infusions:   prismasol BGK 4/2.5 Stopped (12/20/23 1015)    prismasol BGK 4/2.5 Stopped (12/20/23 1015)   anticoagulant sodium citrate     heparin 950 Units/hr (12/22/23 0600)   meropenem (MERREM) IV Stopped (12/21/23 2152)   norepinephrine (LEVOPHED) Adult infusion 16 mcg/min (12/22/23 0600)   prismasol BGK 4/2.5 Stopped (12/20/23 1015)    PRN Medications: alteplase, anticoagulant sodium citrate, artificial tears, bisacodyl **OR** bisacodyl, heparin, heparin, HYDROmorphone (DILAUDID) injection, levalbuterol, lidocaine (PF), lidocaine-prilocaine, melatonin, ondansetron (ZOFRAN) IV, mouth rinse, oxyCODONE, pentafluoroprop-tetrafluoroeth, pneumococcal 20-valent conjugate vaccine, sodium chloride, traZODone  Patient Profile   Vanessa [name] is a 73 y.o. with history of nonischemic cardiomyopathy and severe MR s/p Mitral Clip x2 (2020), pHTN, and CKD IIIb.   Assessment/Plan   1. Post Cardiotomy Cardiogenic & septic shock: Bioprosthetic MVR and tricuspid ring 11/29/23 with Dr. Leafy Ro. Pre-op cath with no significant CAD.  Pre-op echo with EF 40-45%, severe MR. Went back to the OR with post-op tamponade for clean-out.  Echo 12/31 showed EF 25-30%, moderate RV dysfunction, s/p bioprosthetic mitral valve replacement with trivial MR and tricuspid valve repair with trivial TR. Worsened on 1/2 and re-intubated in setting of volume overload, oliguric renal failure &  pulmonary hypertension. Extubated on 12/04/22. 1/13 CT C/A/P  moderate hemorrhagic pericardial effusion and 5.3 cm hematoma right heart border.  Repeat echo no tamponade. Required CRRT for volume removal.  CVVH stopped 1/12 then restarted 01/14 for volume removal. RHC 1/17 showed RA 3 PA 47/20 (30) PCWP 5 PAPi 9 CI 1.74.  CRRT stopped again 1/20.  - remains on NE 16 + midodrine 20 mg three times a day + droxidopa. C/w vasoplegia, SVR 774, FICK CI 3.4  - Currently off CRRT. Remains anuric. CVP 5-9.  No immediate need for CRRT currently but suspect may need to resume in next 24 hrs. Nephrology following.  2. Mitral regurgitation:  TEE in 11/19 with severe MR, restricted posterior leaflet.  She had Mitraclip x 2 in 1/20. Post-op echo showed mild MR and mild mitral stenosis.   Echo in 9/24 showed MV s/p Mitraclip x 2 with mean gradient 6 mmHg and severe mitral regurgitation. TEE was then done to evaluate, showing 2 Mitraclips in place with mean gradient 4 mmHg and severe eccentric MR from between the 2 clips with ERO 0.54 cm^2.  She was not thought to be a candidate for an additional Mitraclip due to lack of room between the two existing clips. R/LHC (11/24) showed moderate mixed pulmonary arterial and pulmonary venous hypertension likely due to mitral valve regurgitation.  S/p MV replacement  and Tricuspid repair on 11/29/23 with Dr. Leafy Ro.  - Last echo stable valves.  3. Ventricular ectopy:  - Continue amiodarone 200 mg bid  4. Pulmonary hypertension: Pre-op RHC (11/24) with PVR 4.5, with moderate mixed pulmonary arterial and pulmonary venous hypertension likely due to mitral valve regurgitation. Now s/p MVR, PA pressure severely elevated post-op.  Suspect still mixed PAH/PVH with elevated PCWP.  NO was tried at 10 ppm but did not have much effect so now off.  Will hold off on pulmonary vasodilators; small clinical trials demonstrate no improvement and some markers of harm with PDE5i in PH in the setting of mitral valve disease. RHC 1/17 with mild pulmonary arterial hypertension.  5. 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 TSH was normal in 8/24. Follows with Endocrinology 6. AKI on CKD stage 3:  1/14 CRRT restarted. CRRT stopped 1/20. Nephrology following.   CVP 5-9.  Remains on NE. Eventual iHD.  7. Blood loss anemia - Hgb 8.3, Stable.  8. Complete heart block: She had post-op CHB. Resolved.  9. Hypoxic / Hypercapnic respiratory failure: Reintubated 01/02.  Extubated 01/05.  -Now  on room air with stable sats.  10. Atrial fibrillation:  - In SR.  - Continue po amiodarone.   - On Heparin drip. Pharmacy dosing, discussing.  11. ID: Suspected septic shock component, no definite source. All lines changed out last week and started on linezolid/meropenem, now just on meropenem.  Afebrile but WBCs down a little 17.5>>15 - Continue meropenem. Stop date 1/25 -12. Thrombocytopenia: Suspect due to sepsis. Platelets trending back up 93 K.  13. Deconditioning - Continue PT.  14. FEN: Getting tube feeds   Continue to mobilize.   Robbie Lis PA-C  12/22/2023 7:17 AM  Agree with above.   Remains anuric. NE dose increased last night to maintain BP. Now on NE 18. Midodrine 20 tid and droxydopa.   Feels ok. Remains on meropenem. No evidence of sepsis  No CP or SOB  General:  Sitting up in chair No resp difficulty HEENT: normal Neck: supple.+ internal jugular HD cath. Carotids 2+ bilat; no bruits. No lymphadenopathy or thryomegaly appreciated. Cor: Regular rate & rhythm. No rubs, gallops or murmurs. Lungs: clear Abdomen: soft, nontender, nondistended. No hepatosplenomegaly. No bruits or masses. Good bowel sounds. Extremities: no cyanosis, clubbing, rash, edema + TED hose Neuro: alert & orientedx3, cranial nerves grossly intact. moves all 4 extremities w/o difficulty. Affect pleasant  Very difficult situation. Has evidence of severe vasoplegia requiring escalating doses of NE despite high-dose midodrine. Few options left.   I have discussed with [facility] and CCM. Will change BP goal to SBP 90. Continue compression. Will discuss possible methylene blue with PharmD. Keep even on CVVHD.  CRITICAL CARE Performed by: Arvilla Meres  Total critical care time: 35 minutes  Critical care time was exclusive of separately billable procedures and treating other patients.  Critical care was necessary to treat or prevent imminent or life-threatening deterioration.  Critical  care was time spent personally by me (independent of midlevel providers or residents) on the following activities: development of treatment plan with patient and/or surrogate as well as nursing, discussions with consultants, evaluation of patient's response to treatment, examination of patient, obtaining history from patient or surrogate, ordering and performing treatments and interventions, ordering and review of laboratory studies, ordering and review of radiographic studies, pulse oximetry and re-evaluation of patient's condition.   Arvilla Meres, MD  9:30 AM

## 2023-12-22 NOTE — Progress Notes (Signed)
[facility] DAILY ICU PROGRESS NOTE                   [address]            [place] [id number]          [phone]   5 Days Post-Op Procedure(s) (LRB): RIGHT HEART CATH (N/A)  Total Length of Stay:  LOS: 23 days   Subjective: Feels fair, not eating much  Objective: Vital signs in last 24 hours: Temp:  [97.6 F (36.4 C)-97.9 F (36.6 C)] 97.9 F (36.6 C) (01/22 0315) Pulse Rate:  [68-83] 71 (01/22 0800) Cardiac Rhythm: Normal sinus rhythm (01/22 0800) Resp:  [11-28] 19 (01/22 0800) BP: (72-147)/(34-93) 109/71 (01/22 0800) SpO2:  [86 %-100 %] 96 % (01/22 0800) Weight:  [79.2 kg] 79.2 kg (01/22 0500)  Filed Weights   12/19/23 0645 12/20/23 0623 12/22/23 0500  Weight: 73.3 kg 73.6 kg 79.2 kg    Weight change:    Hemodynamic parameters for last 24 hours: CVP:  [0 mmHg-11 mmHg] 9 mmHg  Intake/Output from previous day: 01/21 0701 - 01/22 0700 In: 1832.1 [I.V.:507.3; OZ/HY:8657.8; IV Piggyback:280] Out: -   Intake/Output this shift: Total I/O In: 79.5 [I.V.:24.5; NG/GT:55] Out: -   Current Meds: Scheduled Meds:  amiodarone  200 mg Oral BID   ascorbic acid  250 mg Per Tube BID   Chlorhexidine Gluconate Cloth  6 each Topical Daily   darbepoetin (ARANESP) injection - NON-DIALYSIS  40 mcg Subcutaneous Q Tue-1800   droxidopa  200 mg Per Tube TID WC   feeding supplement (VITAL AF 1.2 CAL)  660 mL Per Tube Q24H   fiber supplement (BANATROL TF)  60 mL Per Tube QID   Gerhardt's butt cream   Topical BID   influenza vaccine adjuvanted  0.5 mL Intramuscular Tomorrow-1000   insulin aspart  0-24 Units Subcutaneous Q4H   midodrine  20 mg Per Tube Q8H   multivitamin  1 tablet Per Tube BID   QUEtiapine  25 mg Oral QHS   sertraline  25 mg Oral Daily   zinc sulfate (50mg  elemental zinc)  220 mg Per Tube Daily   Continuous Infusions:   prismasol BGK 4/2.5      prismasol BGK 4/2.5     anticoagulant sodium citrate     heparin 950 Units/hr (12/22/23 0800)    meropenem (MERREM) IV Stopped (12/21/23 2152)   norepinephrine (LEVOPHED) Adult infusion 16 mcg/min (12/22/23 0800)   prismasol BGK 4/2.5     PRN Meds:.alteplase, anticoagulant sodium citrate, artificial tears, bisacodyl **OR** bisacodyl, heparin, heparin, heparin, HYDROmorphone (DILAUDID) injection, levalbuterol, lidocaine (PF), lidocaine-prilocaine, melatonin, ondansetron (ZOFRAN) IV, mouth rinse, oxyCODONE, pentafluoroprop-tetrafluoroeth, pneumococcal 20-valent conjugate vaccine, sodium chloride, traZODone  General appearance: alert, cooperative, fatigued, and no distress Heart: regular rate and rhythm Lungs: dim left base Abdomen: soft, non tender Extremities: no edema Wound: incis healing well  Lab Results: CBC: Recent Labs    12/21/23 0459 12/22/23 0344  WBC 17.5* 15.0*  HGB 8.2* 8.3*  HCT 26.9* 26.8*  PLT 82* 93*   BMET:  Recent Labs    12/21/23 1700 12/22/23 0344  NA 128* 131*  K 5.2* 5.1  CL 96* 94*  CO2 20* 23  GLUCOSE 127* 132*  BUN 41* 51*  CREATININE 3.40* 4.18*  CALCIUM 8.8* 9.1    CMET: Lab Results  Component Value Date   WBC 15.0 (H) 12/22/2023   HGB 8.3 (L) 12/22/2023   HCT 26.8 (L) 12/22/2023  PLT 93 (L) 12/22/2023   GLUCOSE 132 (H) 12/22/2023   CHOL 178 08/11/2019   TRIG 90 11/30/2023   HDL 57 08/11/2019   LDLCALC 109 (H) 08/11/2019   ALT 45 (H) 12/16/2023   AST 35 12/16/2023   NA 131 (L) 12/22/2023   K 5.1 12/22/2023   CL 94 (L) 12/22/2023   CREATININE 4.18 (H) 12/22/2023   BUN 51 (H) 12/22/2023   CO2 23 12/22/2023   TSH 0.560 05/26/2023   INR 1.3 (H) 12/21/2023   HGBA1C 6.0 (H) 11/25/2023   MICROALBUR 3.1 (H) 08/11/2019      PT/INR:  Recent Labs    12/21/23 0457  LABPROT 16.0*  INR 1.3*   Radiology: No results found.   Assessment/Plan: S/P Procedure(s) (LRB): RIGHT HEART CATH (N/A)   1 afebrile, s BP 72-147, sinus rhythm, on amio, previous afib, Co-ox 80 2 remains anuric on CRRT- per nephrology management 3 O2  sats good on 3 liters 4 AHF assisting w/ gtt management, on NE, Midodrine and droxidopa, Mild PAH on most recent RHC 5 ID_on meripenem, off linezolid- WBC trending lower- no definite source , platelets also trending higher( thrombocytopenia) 6 on TF's, eating - fair 7 conts therapies 8 multifactorial anemia - stable 9 CBG's adeq control     Rowe Clack PA-C Pager [phone] 12/22/2023 8:16 AM

## 2023-12-23 ENCOUNTER — Inpatient Hospital Stay (HOSPITAL_COMMUNITY): Payer: PPO

## 2023-12-23 ENCOUNTER — Encounter (HOSPITAL_COMMUNITY): Payer: Self-pay | Admitting: Thoracic Surgery (Cardiothoracic Vascular Surgery)

## 2023-12-23 DIAGNOSIS — R570 Cardiogenic shock: Secondary | ICD-10-CM | POA: Diagnosis not present

## 2023-12-23 DIAGNOSIS — N189 Chronic kidney disease, unspecified: Secondary | ICD-10-CM

## 2023-12-23 DIAGNOSIS — R579 Shock, unspecified: Secondary | ICD-10-CM | POA: Diagnosis not present

## 2023-12-23 DIAGNOSIS — E46 Unspecified protein-calorie malnutrition: Secondary | ICD-10-CM

## 2023-12-23 DIAGNOSIS — Z952 Presence of prosthetic heart valve: Secondary | ICD-10-CM | POA: Diagnosis not present

## 2023-12-23 DIAGNOSIS — R5381 Other malaise: Secondary | ICD-10-CM | POA: Diagnosis not present

## 2023-12-23 LAB — CBC
HCT: 25.4 % — ABNORMAL LOW (ref 36.0–46.0)
HCT: 24.8 % — ABNORMAL LOW (ref 36.0–46.0)
Hemoglobin: 7.8 g/dL — ABNORMAL LOW (ref 12.0–15.0)
Hemoglobin: 7.4 g/dL — ABNORMAL LOW (ref 12.0–15.0)
MCH: 32.9 pg (ref 26.0–34.0)
MCH: 32.3 pg (ref 26.0–34.0)
MCHC: 30.7 g/dL (ref 30.0–36.0)
MCHC: 29.8 g/dL — ABNORMAL LOW (ref 30.0–36.0)
MCV: 107.2 fL — ABNORMAL HIGH (ref 80.0–100.0)
MCV: 108.3 fL — ABNORMAL HIGH (ref 80.0–100.0)
Platelets: 95 10*3/uL — ABNORMAL LOW (ref 150–400)
Platelets: 104 10*3/uL — ABNORMAL LOW (ref 150–400)
RBC: 2.37 MIL/uL — ABNORMAL LOW (ref 3.87–5.11)
RBC: 2.29 MIL/uL — ABNORMAL LOW (ref 3.87–5.11)
RDW: 25.8 % — ABNORMAL HIGH (ref 11.5–15.5)
RDW: 25.8 % — ABNORMAL HIGH (ref 11.5–15.5)
WBC: 9 10*3/uL (ref 4.0–10.5)
WBC: 9.3 10*3/uL (ref 4.0–10.5)
nRBC: 0.6 % — ABNORMAL HIGH (ref 0.0–0.2)
nRBC: 0.5 % — ABNORMAL HIGH (ref 0.0–0.2)

## 2023-12-23 LAB — RENAL FUNCTION PANEL
Albumin: 2.3 g/dL — ABNORMAL LOW (ref 3.5–5.0)
Albumin: 2.3 g/dL — ABNORMAL LOW (ref 3.5–5.0)
Anion gap: 8 (ref 5–15)
Anion gap: 8 (ref 5–15)
BUN: 21 mg/dL (ref 8–23)
BUN: 19 mg/dL (ref 8–23)
CO2: 26 mmol/L (ref 22–32)
CO2: 26 mmol/L (ref 22–32)
Calcium: 7.9 mg/dL — ABNORMAL LOW (ref 8.9–10.3)
Calcium: 8.1 mg/dL — ABNORMAL LOW (ref 8.9–10.3)
Chloride: 96 mmol/L — ABNORMAL LOW (ref 98–111)
Chloride: 99 mmol/L (ref 98–111)
Creatinine, Ser: 1.71 mg/dL — ABNORMAL HIGH (ref 0.44–1.00)
Creatinine, Ser: 1.47 mg/dL — ABNORMAL HIGH (ref 0.44–1.00)
GFR, Estimated: 31 mL/min — ABNORMAL LOW (ref >60)
GFR, Estimated: 38 mL/min — ABNORMAL LOW (ref >60)
Glucose, Bld: 150 mg/dL — ABNORMAL HIGH (ref 70–99)
Glucose, Bld: 125 mg/dL — ABNORMAL HIGH (ref 70–99)
Phosphorus: 3 mg/dL (ref 2.5–4.6)
Phosphorus: 2.5 mg/dL (ref 2.5–4.6)
Potassium: 4.4 mmol/L (ref 3.5–5.1)
Potassium: 4.5 mmol/L (ref 3.5–5.1)
Sodium: 130 mmol/L — ABNORMAL LOW (ref 135–145)
Sodium: 133 mmol/L — ABNORMAL LOW (ref 135–145)

## 2023-12-23 LAB — GLUCOSE, CAPILLARY
Glucose-Capillary: 158 mg/dL — ABNORMAL HIGH (ref 70–99)
Glucose-Capillary: 142 mg/dL — ABNORMAL HIGH (ref 70–99)
Glucose-Capillary: 117 mg/dL — ABNORMAL HIGH (ref 70–99)
Glucose-Capillary: 111 mg/dL — ABNORMAL HIGH (ref 70–99)
Glucose-Capillary: 125 mg/dL — ABNORMAL HIGH (ref 70–99)

## 2023-12-23 LAB — HEPARIN LEVEL (UNFRACTIONATED): Heparin Unfractionated: 0.4 [IU]/mL (ref 0.30–0.70)

## 2023-12-23 LAB — COOXEMETRY PANEL
Carboxyhemoglobin: 2.2 % — ABNORMAL HIGH (ref 0.5–1.5)
Methemoglobin: 1 % (ref 0.0–1.5)
O2 Saturation: 76.1 %
Total hemoglobin: 8.1 g/dL — ABNORMAL LOW (ref 12.0–16.0)

## 2023-12-23 LAB — MAGNESIUM: Magnesium: 2.5 mg/dL — ABNORMAL HIGH (ref 1.7–2.4)

## 2023-12-23 MED ORDER — PRISMASOL BGK 4/2.5 32-4-2.5 MEQ/L REPLACEMENT SOLN: Status: DC

## 2023-12-23 MED ORDER — PRISMASOL BGK 4/2.5 32-4-2.5 MEQ/L REPLACEMENT SOLN
Status: DC
Start: 2023-12-23 — End: 2023-12-23

## 2023-12-23 MED ORDER — PRISMASOL BGK 4/2.5 32-4-2.5 MEQ/L EC SOLN: Status: DC

## 2023-12-23 MED ORDER — SODIUM CHLORIDE 0.9 % IV SOLN: 350.0000 [IU]/h | INTRAVENOUS | Status: DC

## 2023-12-23 MED ORDER — HEPARIN SODIUM (PORCINE) 1000 UNIT/ML DIALYSIS: 1000.0000 [IU] | INTRAMUSCULAR | Status: DC | PRN

## 2023-12-23 MED ORDER — CARMEX CLASSIC LIP BALM EX OINT
TOPICAL_OINTMENT | CUTANEOUS | Status: DC | PRN
  Filled 2023-12-23: qty 10

## 2023-12-23 NOTE — Progress Notes (Signed)
CRRT Filter clotted, pt blood not returned

## 2023-12-23 NOTE — Progress Notes (Addendum)
Patient ID: Vanessa [name], female   DOB: 1951/11/12, 73 y.o.   MRN: [id number]      Advanced Heart Failure Rounding Note  Cardiologist: Dr. Shirlee Latch  Chief Complaint: Cardiogenic Shock  Subjective:   12/30 S/P MVR (tissue) and TV ring and same day return to OR for tamponade/bleeding. 1/2: Progressive volume overload with poor UOP, CVVH started but ended up re-intubated.   Extubated 12/05/23 1/12: CVVH stopped 1/13: iHD, HD line removed 1/14: CRRT Restarted.  1/17: RHC mean RA 2, PA 47/20, mean PCWP 5, CI 1.74, PVR 8, PAPi 9.  Milrinone 0.125 started. 1/20 CRRT stopped.  1/22 methylene blue for persistent vasoplegia   NE requirement significantly down past 24 hrs, down from 18>>8 today.  Also on midodrine and droxidoa. SBPs low 100s-110s but SVR remains low. Remains on meropenem. No evidence of sepsis.  Co-ox 76% FICK CO 9.8/ CI 5.4 SVR 600  Remains anuric. On CRRT, running even. CVP 6-7   K 4.4 BUN 21  Just finished working w/ PT at bedside. Frustrated regarding her continued weakness and fatigued. Very tired. Did not sleep well last night. Appetite is still poor. Getting TFs. No n/v. Denies dyspnea.   Objective:   Weight Range: 77.1 kg Body mass index is 30.11 kg/m.   Vital Signs:   Temp:  [97.8 F (36.6 C)-98.2 F (36.8 C)] 98.1 F (36.7 C) (01/23 0700) Pulse Rate:  [61-85] 71 (01/23 1015) Resp:  [11-41] 29 (01/23 1015) BP: (65-127)/(39-95) 110/49 (01/23 1015) SpO2:  [92 %-100 %] 98 % (01/23 1015) Weight:  [77.1 kg] 77.1 kg (01/23 0434) Last BM Date : 12/22/23    Weight change: Filed Weights   12/20/23 0623 12/22/23 0500 12/23/23 0434  Weight: 73.6 kg 79.2 kg 77.1 kg   Intake/Output:   Intake/Output Summary (Last 24 hours) at 12/23/2023 1021 Last data filed at 12/23/2023 1000 Gross per 24 hour  Intake 2709.96 ml  Output 2452.1 ml  Net 257.86 ml    Physical Exam   CVP 6-7  General:  fatigued/ depressed appearing. No respiratory difficulty HEENT:  normal Neck: supple. RIJ HD cath JVD 6-7 cm Carotids 2+ bilat; no bruits. No lymphadenopathy or thyromegaly appreciated. Cor: PMI nondisplaced. Regular rate & rhythm. No rubs, gallops or murmurs. Sternal wound ok  Lungs: decreased BS at the bases bilaterally  Abdomen: soft, nontender, nondistended. No hepatosplenomegaly. No bruits or masses. Good bowel sounds. Extremities: no cyanosis, clubbing, rash, no edema Neuro: alert & oriented x 3, cranial nerves grossly intact. moves all 4 extremities w/o difficulty. Affect pleasant.   Telemetry   NSR 70s w/  occasional PVCs  Labs    CBC Recent Labs    12/22/23 0344 12/23/23 0408  WBC 15.0* 9.0  HGB 8.3* 7.8*  HCT 26.8* 25.4*  MCV 104.7* 107.2*  PLT 93* 95*   Basic Metabolic Panel Recent Labs    78/29/56 0344 12/22/23 1701 12/23/23 0408  NA 131* 130* 130*  K 5.1 4.6 4.4  CL 94* 99 96*  CO2 23 22 26   GLUCOSE 132* 120* 150*  BUN 51* 34* 21  CREATININE 4.18* 2.50* 1.71*  CALCIUM 9.1 8.4* 7.9*  MG 2.7*  --  2.5*  PHOS 7.9* 4.1 3.0   Liver Function Tests Recent Labs    12/22/23 1701 12/23/23 0408  ALBUMIN 2.4* 2.3*   No results for input(s): "LIPASE", "AMYLASE" in the last 72 hours. Cardiac Enzymes No results for input(s): "CKTOTAL", "CKMB", "CKMBINDEX", "TROPONINI" in the last 72 hours.  BNP: BNP (last 3 results) Recent Labs    05/26/23 1610 08/26/23 1103 09/22/23 0905  BNP 778.0* 781.0* 926.0*   Other results:  Imaging   No results found.  Medications:   Scheduled Medications:  amiodarone  200 mg Oral BID   ascorbic acid  250 mg Per Tube BID   Chlorhexidine Gluconate Cloth  6 each Topical Daily   darbepoetin (ARANESP) injection - NON-DIALYSIS  40 mcg Subcutaneous Q Tue-1800   droxidopa  200 mg Per Tube TID WC   feeding supplement (PROSource TF20)  60 mL Per Tube BID   fiber supplement (BANATROL TF)  60 mL Per Tube QID   Gerhardt's butt cream   Topical BID   influenza vaccine adjuvanted  0.5 mL  Intramuscular Tomorrow-1000   insulin aspart  0-24 Units Subcutaneous Q4H   midodrine  20 mg Per Tube Q8H   multivitamin  1 tablet Per Tube BID   QUEtiapine  25 mg Per Tube QHS   sertraline  25 mg Per Tube Daily   zinc sulfate (50mg  elemental zinc)  220 mg Per Tube Daily    Infusions:   prismasol BGK 4/2.5 400 mL/hr at 12/22/23 2132    prismasol BGK 4/2.5 400 mL/hr at 12/22/23 2130   sodium chloride 10 mL/hr at 12/23/23 1000   anticoagulant sodium citrate     feeding supplement (VITAL 1.5 CAL) 40 mL/hr at 12/23/23 1000   heparin 1,050 Units/hr (12/23/23 1000)   meropenem (MERREM) IV Stopped (12/22/23 2346)   norepinephrine (LEVOPHED) Adult infusion 8 mcg/min (12/23/23 1000)   prismasol BGK 4/2.5 1,500 mL/hr at 12/23/23 0908    PRN Medications: sodium chloride, alteplase, anticoagulant sodium citrate, artificial tears, bisacodyl **OR** bisacodyl, heparin, heparin, heparin, HYDROmorphone (DILAUDID) injection, levalbuterol, lidocaine (PF), lidocaine-prilocaine, melatonin, ondansetron (ZOFRAN) IV, mouth rinse, oxyCODONE, pentafluoroprop-tetrafluoroeth, pneumococcal 20-valent conjugate vaccine, sodium chloride, traZODone  Patient Profile   HUONG BUTTRY is a 73 y.o. with history of nonischemic cardiomyopathy and severe MR s/p Mitral Clip x2 (2020), pHTN, and CKD IIIb.   Assessment/Plan   1. Post Cardiotomy Cardiogenic & septic shock: Bioprosthetic MVR and tricuspid ring 11/29/23 with Dr. Leafy Ro. Pre-op cath with no significant CAD.  Pre-op echo with EF 40-45%, severe MR. Went back to the OR with post-op tamponade for clean-out.  Echo 12/31 showed EF 25-30%, moderate RV dysfunction, s/p bioprosthetic mitral valve replacement with trivial MR and tricuspid valve repair with trivial TR. Worsened on 1/2 and re-intubated in setting of volume overload, oliguric renal failure &  pulmonary hypertension. Extubated on 12/04/22. 1/13 CT C/A/P  moderate hemorrhagic pericardial effusion and 5.3 cm  hematoma right heart border.  Repeat echo no tamponade. Required CRRT for volume removal.  CVVH stopped 1/12 then restarted 01/14 for volume removal. RHC 1/17 showed RA 3 PA 47/20 (30) PCWP 5 PAPi 9 CI 1.74.  - c/w vasopalegia. On NE, high dose midodrine + droxidopa. Methylene blu given 1/22.  - NE requirement significantly down past 24 hrs, down from 18>>8 today. Continue to wean NE as tolerated. Keep BP goal to SBP 90.  - continue midodrine 20 tid + droxidopa  - Remains anuric. CVP 6-7. On CRRT, keep even for now  2. Mitral regurgitation:  TEE in 11/19 with severe MR, restricted posterior leaflet.  She had Mitraclip x 2 in 1/20. Post-op echo showed mild MR and mild mitral stenosis.   Echo in 9/24 showed MV s/p Mitraclip x 2 with mean gradient 6 mmHg and severe mitral regurgitation. TEE was  then done to evaluate, showing 2 Mitraclips in place with mean gradient 4 mmHg and severe eccentric MR from between the 2 clips with ERO 0.54 cm^2.  She was not thought to be a candidate for an additional Mitraclip due to lack of room between the two existing clips. R/LHC (11/24) showed moderate mixed pulmonary arterial and pulmonary venous hypertension likely due to mitral valve regurgitation.  S/p MV replacement and Tricuspid repair on 11/29/23 with Dr. Leafy Ro.  - Last echo stable valves.  3. Ventricular ectopy:  - Continue amiodarone 200 mg bid  4. Pulmonary hypertension: Pre-op RHC (11/24) with PVR 4.5, with moderate mixed pulmonary arterial and pulmonary venous hypertension likely due to mitral valve regurgitation. Now s/p MVR, PA pressure severely elevated post-op.  Suspect still mixed PAH/PVH with elevated PCWP.  NO was tried at 10 ppm but did not have much effect so now off.  Will hold off on pulmonary vasodilators; small clinical trials demonstrate no improvement and some markers of harm with PDE5i in PH in the setting of mitral valve disease. RHC 1/17 with mild pulmonary arterial hypertension.  5. Toxic  multinodular goiter: She is now off methimazole. TSH was normal in 8/24. Follows with Endocrinology 6. AKI on CKD stage 3:  1/14 CRRT restarted. CRRT stopped 1/20 and restarted 1/22.  - CVP 6-7. Keep even for now - Nephrology following.   7. Blood loss anemia - Hgb 7.8, monitor closely  8. Complete heart block: She had post-op CHB. Resolved.  9. Hypoxic / Hypercapnic respiratory failure: Reintubated 01/02.  Extubated 01/05.  -Now on room air with stable sats.  10. Atrial fibrillation:  - In SR.  - Continue po amiodarone.   - On Heparin drip. Pharmacy dosing, discussing.  11. ID: Suspected septic shock component, no definite source. All lines changed out last week and started on linezolid/meropenem, now just on meropenem.  Afebrile but WBCs down a little 17.5>>15>>9.0  - Continue meropenem. Stop date 1/25 -12. Thrombocytopenia: Suspect due to sepsis. Platelets trending back up 95K today.  13. Deconditioning - Continue PT.  14. FEN: Getting tube feeds  15. Insomnia: add sleep aid, d/w pharmD   Robbie Lis PA-C  12/23/2023 10:21 AM   Agree with above.   Remains on NE but able to wean 18 -> 8. On high-dose midodrine and droxdopa.   Anuric. Remains on CVVHD. CVP 6-7  Co-ox ok  Remains on TF. Weak. Working with PT. No CP or SOB.  General:  Weak appearing. No resp difficulty HEENT: normal Neck: supple. + HD cath Cor: PMI nondisplaced. Regular rate & rhythm. No rubs, gallops or murmurs. Lungs: clear Abdomen: soft, nontender, nondistended. No hepatosplenomegaly. No bruits or masses. Good bowel sounds. Extremities: no cyanosis, clubbing, rash, tr edema + wraps Neuro: alert & orientedx3, cranial nerves grossly intact. moves all 4 extremities w/o difficulty. Affect pleasant  Continues to struggle with vasoplegia and low BPs requiring NE. Some improvement over last 24 hours. Plan 1. Continue to wean NE with SBP goal >= 90 2. Keep CVVHD even today 3. Continue compression 4.  Ambulate 5. Continue heparin drip until decision regarding need for further HD access  CRITICAL CARE Performed by: Arvilla Meres  Total critical care time: 34 minutes  Critical care time was exclusive of separately billable procedures and treating other patients.  Critical care was necessary to treat or prevent imminent or life-threatening deterioration.  Critical care was time spent personally by me (independent of midlevel providers or residents) on the following  activities: development of treatment plan with patient and/or surrogate as well as nursing, discussions with consultants, evaluation of patient's response to treatment, examination of patient, obtaining history from patient or surrogate, ordering and performing treatments and interventions, ordering and review of laboratory studies, ordering and review of radiographic studies, pulse oximetry and re-evaluation of patient's condition.  Arvilla Meres, MD  11:09 AM

## 2023-12-23 NOTE — Plan of Care (Signed)
  Problem: Education: Goal: Knowledge of General Education information will improve Description: Including pain rating scale, medication(s)/side effects and non-pharmacologic comfort measures Outcome: Progressing   Problem: Health Behavior/Discharge Planning: Goal: Ability to manage health-related needs will improve Outcome: Progressing   Problem: Clinical Measurements: Goal: Ability to maintain clinical measurements within normal limits will improve Outcome: Progressing Goal: Will remain free from infection Outcome: Progressing Goal: Diagnostic test results will improve Outcome: Progressing Goal: Respiratory complications will improve Outcome: Progressing Goal: Cardiovascular complication will be avoided Outcome: Progressing   Problem: Nutrition: Goal: Adequate nutrition will be maintained Outcome: Progressing   Problem: Coping: Goal: Level of anxiety will decrease Outcome: Progressing   Problem: Pain Management: Goal: General experience of comfort will improve Outcome: Progressing   Problem: Safety: Goal: Ability to remain free from injury will improve Outcome: Progressing   Problem: Skin Integrity: Goal: Risk for impaired skin integrity will decrease Outcome: Progressing   Problem: Education: Goal: Will demonstrate proper wound care and an understanding of methods to prevent future damage Outcome: Progressing Goal: Knowledge of disease or condition will improve Outcome: Progressing Goal: Knowledge of the prescribed therapeutic regimen will improve Outcome: Progressing Goal: Individualized Educational Video(s) Outcome: Progressing   Problem: Cardiac: Goal: Will achieve and/or maintain hemodynamic stability Outcome: Progressing   Problem: Clinical Measurements: Goal: Postoperative complications will be avoided or minimized Outcome: Progressing   Problem: Respiratory: Goal: Respiratory status will improve Outcome: Progressing   Problem: Skin  Integrity: Goal: Wound healing without signs and symptoms of infection Outcome: Progressing Goal: Risk for impaired skin integrity will decrease Outcome: Progressing   Problem: Education: Goal: Will demonstrate proper wound care and an understanding of methods to prevent future damage Outcome: Progressing Goal: Knowledge of disease or condition will improve Outcome: Progressing Goal: Knowledge of the prescribed therapeutic regimen will improve Outcome: Progressing Goal: Individualized Educational Video(s) Outcome: Progressing   Problem: Activity: Goal: Risk for activity intolerance will decrease Outcome: Progressing   Problem: Cardiac: Goal: Will achieve and/or maintain hemodynamic stability Outcome: Progressing   Problem: Clinical Measurements: Goal: Postoperative complications will be avoided or minimized Outcome: Progressing   Problem: Respiratory: Goal: Respiratory status will improve Outcome: Progressing   Problem: Skin Integrity: Goal: Wound healing without signs and symptoms of infection Outcome: Progressing Goal: Risk for impaired skin integrity will decrease Outcome: Progressing   Problem: Urinary Elimination: Goal: Ability to achieve and maintain adequate renal perfusion and functioning will improve Outcome: Progressing   Problem: Education: Goal: Understanding of CV disease, CV risk reduction, and recovery process will improve Outcome: Progressing Goal: Individualized Educational Video(s) Outcome: Progressing

## 2023-12-23 NOTE — Progress Notes (Signed)
Physical Therapy Treatment Patient Details Name: Vanessa [name] MRN: [id number] DOB: [birth year] Today's Date: 12/23/2023   History of Present Illness 73 yo F admitted 12/30 with DOE, increasing MR and TR. LCH without CAD. 12/30 MVR and tricuspid valve repair. Intubated 1/2-1/5. 1/2 CRRT started/stopped on 1/12. Pt HD line was removed on 1/13 and CRRT was re-started on 1/14-1/20.  PMhx: HTN, NICM, HFrEF (EF40-45%), Pulmonary HTN, CKD and  severe MR s/p prior mitral clip x 2 in 2020.    PT Comments  Pt was very fatigued today. Attempted gait but unable to progress Continues to require 2 person assist for sit to stand and transfers to manage lines and leads. Pt with heavy support on RW this session and flexed posture during transfer. Pt is very motivated and frustrated by weakness today. Due to pt current functional status, home set up and available assistance at home recommending skilled physical therapy services > 3 hours/day in order to address strength, balance and functional mobility to decrease risk for falls, injury, immobility, skin break down and re-hospitalization.      If plan is discharge home, recommend the following: Assistance with cooking/housework;Help with stairs or ramp for entrance     Equipment Recommendations  Rolling walker (2 wheels);BSC/3in1       Precautions / Restrictions Precautions Precautions: Sternal;Fall;Other (comment) Precaution Booklet Issued: No Precaution Comments: cortrak, watch vitals Restrictions Weight Bearing Restrictions Per Provider Order: Yes RUE Weight Bearing Per Provider Order: Non weight bearing LUE Weight Bearing Per Provider Order: Non weight bearing Other Position/Activity Restrictions: sternal precautions     Mobility  Bed Mobility Overal bed mobility: Needs Assistance Bed Mobility: Sit to Sidelying         Sit to sidelying: Mod assist, +2 for physical assistance General bed mobility comments: Mod A with assist at trunk and LE Pt  was then able to bridge with cueing for sequencing and scoot hips toward the L side of the bed without assistance.    Transfers Overall transfer level: Needs assistance Equipment used: Rolling walker (2 wheels) Transfers: Sit to/from Stand, Bed to chair/wheelchair/BSC Sit to Stand: +2 physical assistance, Mod assist   Step pivot transfers: Min assist, +2 safety/equipment, +2 physical assistance       General transfer comment: Pt requires Mod A to scoot to edge of recliner while maintaining precautions. Mod A +2 for sit to stand from recliner while maintaining sternal precautions with use of bed pad to help stand. Min A +2 for step pivot transfers for balance and assist navigating AD for turns with verbal cues for safety.    Ambulation/Gait       Gait Pattern/deviations: Trunk flexed, Step-to pattern, Decreased step length - right, Decreased step length - left       General Gait Details: attempted to perform gait but due to LE weakness and fatigue pt was unable to progress today. Pt was resting heavily on RW with forearms and unable to get to upright posture. Very short steps with increased time and heavy assist for navigating AD for turns to get to sitting in reciner       Balance Overall balance assessment: Needs assistance Sitting-balance support: No upper extremity supported, Feet supported, Feet unsupported Sitting balance-Leahy Scale: Good Sitting balance - Comments: SBA for sitting EOB no LOB   Standing balance support: Bilateral upper extremity supported, Reliant on assistive device for balance Standing balance-Leahy Scale: Poor Standing balance comment: reliant on externa support        Cognition  Arousal: Alert Behavior During Therapy: WFL for tasks assessed/performed, Flat affect Overall Cognitive Status: Within Functional Limits for tasks assessed       Memory: Decreased recall of precautions         General Comments: pt frustrated with progress due to  feeling fatigued           General Comments General comments (skin integrity, edema, etc.): Pt reports no dizziness/lightheadedness during or after transfer      Pertinent Vitals/Pain Pain Assessment Pain Assessment: No/denies pain     PT Goals (current goals can now be found in the care plan section) Acute Rehab PT Goals Patient Stated Goal: return home PT Goal Formulation: With patient Time For Goal Achievement: 01/03/24 Potential to Achieve Goals: Good Progress towards PT goals: Progressing toward goals    Frequency    Min 1X/week      PT Plan  Continue with current POC        AM-PAC PT "6 Clicks" Mobility   Outcome Measure  Help needed turning from your back to your side while in a flat bed without using bedrails?: A Lot   Help needed moving to and from a bed to a chair (including a wheelchair)?: A Lot Help needed standing up from a chair using your arms (e.g., wheelchair or bedside chair)?: A Lot Help needed to walk in hospital room?: A Lot Help needed climbing 3-5 steps with a railing? : Total 6 Click Score: 9    End of Session   Activity Tolerance: Patient limited by fatigue Patient left: with call bell/phone within reach;with nursing/sitter in room;in bed Nurse Communication: Mobility status PT Visit Diagnosis: Other abnormalities of gait and mobility (R26.89);Muscle weakness (generalized) (M62.81);Difficulty in walking, not elsewhere classified (R26.2)     Time: 1914-7829 PT Time Calculation (min) (ACUTE ONLY): 17 min  Charges:    $Therapeutic Activity: 8-22 mins PT General Charges $$ ACUTE PT VISIT: 1 Visit                    Harrel Carina, DPT, CLT  Acute Rehabilitation Services Office: [phone] (Secure chat preferred)     Claudia Desanctis 12/23/2023, 10:03 AM

## 2023-12-23 NOTE — Progress Notes (Signed)
PHARMACY - ANTICOAGULATION CONSULT NOTE  Pharmacy Consult for IV heparin Indication: atrial fibrillation  No Known Allergies  Patient Measurements: Height: 5\' 3"  (160 cm) Weight: 77.1 kg (169 lb 15.6 oz) IBW/kg (Calculated) : 52.4 Heparin Dosing Weight: ~ 70 kg  Vital Signs: Temp: 98.1 F (36.7 C) (01/23 0700) Temp Source: Axillary (01/23 0700) BP: 110/49 (01/23 1015) Pulse Rate: 71 (01/23 1015)  Labs: Recent Labs     0000 12/21/23 0457 12/21/23 0459 12/21/23 1700 12/22/23 0344 12/22/23 1701 12/23/23 0408  HGB   < >  --  8.2*  --  8.3*  --  7.8*  HCT  --   --  26.9*  --  26.8*  --  25.4*  PLT  --   --  82*  --  93*  --  95*  APTT  --  87*  --   --   --   --   --   LABPROT  --  16.0*  --   --   --   --   --   INR  --  1.3*  --   --   --   --   --   HEPARINUNFRC  --  0.28*  --   --  0.26* 0.37 0.40  CREATININE  --  2.77*  --    < > 4.18* 2.50* 1.71*   < > = values in this interval not displayed.    Estimated Creatinine Clearance: 29.2 mL/min (A) (by C-G formula based on SCr of 1.71 mg/dL (H)).  Warfarin doses received (INR): 1/15 - 2.5 (1.1) 1/16 - 2.5 (1.1) 1/17 - 2.5 (1.2) 1/18 - 2.5 (1.2) 1/19 - 5 (1.3) 1/20 - 5 (1.3) 1/21 - hold for permanent dialysis access plans  Assessment: 73 yo female s/p scheduled bioprosthetic mitral valve replacement with tissue valve 12/30 now with afib, pharmacy asked to dose anticoagulation with IV heparin. Spontaneously converted 1/7; flipped back to AF overnight 1/9 0300. She is noted on CRRT. Baseline INR 1.1, slow uptrend to 1.3 s/p 6 days warfarin therapy, total dose 20 mg thus far. CRRT held 1/20 at 1015 with filter change/clot, will need to evaluate dialysis plans. Warfarin held 1/21 pending permanent dialysis access plans.  HL therapeutic x2 at 0.37 & 0.40 after increase in rate on 1/22. CBC remains low-stable, note Hgb 7.8. CRRT continues with net even and VIR consult in place for permanent dialysis access. Of note, WBC  today improved to 9.0 and patient is afebrile. Still requiring norepinephrine, midodrine, and droxidopa (added 1/21) for hemodynamic tolerance of dialysis. No new s/sx bleeding per discussion with RN, no noted issues with line at this time.  Goal of Therapy:  Heparin level 0.3-0.5 units/ml INR goal 2 - 3 (bMVR) Monitor platelets by anticoagulation protocol: Yes   Plan:  Continue heparin at 1050 units/hr Hold warfarin, f/u restart after permanent dialysis access established F/u 8 hour heparin level Daily heparin level, CBC and PRN (stopped monitoring aPTT with downtrending Tbili and several days relatively stable) F/u IV access if temp cath pulled (previous issues with lab draw site vs infusion line)  Rutherford Nail, PharmD PGY2 Critical Care Pharmacy Resident 12/23/2023  10:34 AM

## 2023-12-23 NOTE — Progress Notes (Signed)
NAME:  Vanessa [name], MRN:  [id number], DOB:  09/03/1951, LOS: 24 ADMISSION DATE:  11/29/2023, CONSULTATION DATE:  11/29/23 REFERRING MD:  Dr. Leafy Ro, CHIEF COMPLAINT:  postop   History of Present Illness:  72yoF with PMH significant for HTN, NICM, HFrEF (EF40-45%), PHTN, CKD and hx of prior severe MR s/p prior mitral clip x 2 in 2020.  Pt with progressive DOE found to have increasing MR  and moderate TR despite adjustments in GDMT.  Not felt to be candidate for additional mitral clip.  Repeat LHC without CAD but showed moderate PHTN.  Underwent mitral valve replacement with tissue valve and tricuspid valve repair by Dr. Leafy Ro on 12/30.  PCCM consulted to assist with vent and medical management post-operatively in ICU.   Pertinent  Medical History  Never smoker, MR w/ previous Mitral clip x 2 (2020), NICM, HFrEF, PHTN, HTN, CKD3b  Significant Hospital Events: Including procedures, antibiotic start and stop dates in addition to other pertinent events   MVR by Dr. Leafy Ro; relook for tamponade/washout later in evening without obvious source 1/2 intubated for respiratory decompensation, iNO started. Vanc, meropenem started. CRRT started.  1/5 extubated 1/8 converted to NSR overnight 1/9 back to A.fib around 0200 1/11 R introducer out.  Unable to thread right subclavian catheter 1/12 remains weak still pressor dependent 1/13 repeat [facility] sent. Still on pressors..,  Zosyn changed to meropenem.  Chest abdomen and pelvis CT obtained : Moderate hemorrhagic pericardial effusion, 5.3 cm hematoma right thrombus. Arterial gas bilateral patchy opacities mild dependent posterior right upper lobe atelectasis was unremarkable layering gallstones  1/14 new right IJ HD catheter placed.  The echocardiogram Was obtained to better evaluate her EF is 35 to 40%.  The LV demonstrates regional wall motion abnormalities there is asymmetric left ventricular hypertrophy of the inferior lateral segment RV systolic  function moderate regular reduced left atrial wall dilated no mention of clot.  1/15 still on fairly high-dose norepinephrine 1/16 cont CRRT + low dose NE  1/17 RHC confirms low output state, restarted on milrinone 1/21: dc milrinone yesterday, ongoing increase NE requirements during nights. Started droxidopa in addition to midodrine to aid off NE.  1/22: levo up to 18 overnight (max so far), cvp 9, restarting crrt today. Received dose methylene blue, Cortisol 21 1/23 Levo down to 9, CVP 8  Interim History / Subjective:  Received dose of methylene blue last night. NE down from 18 to 9 this AM. CVP 8    prismasol BGK 4/2.5 400 mL/hr at 12/22/23 2132    prismasol BGK 4/2.5 400 mL/hr at 12/22/23 2130   sodium chloride 10 mL/hr at 12/23/23 0700   anticoagulant sodium citrate     feeding supplement (VITAL 1.5 CAL) 40 mL/hr at 12/23/23 0700   heparin 1,050 Units/hr (12/23/23 0700)   meropenem (MERREM) IV Stopped (12/22/23 2346)   norepinephrine (LEVOPHED) Adult infusion 8 mcg/min (12/23/23 0700)   prismasol BGK 4/2.5 1,500 mL/hr at 12/23/23 0546     Objective   Blood pressure (!) 104/55, pulse 71, temperature 98.1 F (36.7 C), temperature source Axillary, resp. rate 13, height 5\' 3"  (1.6 m), weight 77.1 kg, SpO2 99%. CVP:  [7 mmHg-9 mmHg] 7 mmHg      Intake/Output Summary (Last 24 hours) at 12/23/2023 0744 Last data filed at 12/23/2023 0700 Gross per 24 hour  Intake 2524.07 ml  Output 2291.1 ml  Net 232.97 ml   Filed Weights   12/20/23 0623 12/22/23 0500 12/23/23 0434  Weight: 73.6 kg  79.2 kg 77.1 kg   Examination:  General: older female, acute on chronically ill appearing, OOB in chair, no acute distress  HEENT: [place]/at, perrla, no jvd, mmm, hd line rij  Pulm: diminished LLL lung field, no wheezing, rhonchi, on 4LNC with sat 97% Cardiac: RRR, no M/R/G Abdomen: rounded, soft, non-tender Extremities: no edema  Neuro: globally weak, rass 0, oriented, non focal  Resolved Hospital  Problem list :   Postoperative vent management Postop tamponade resolved after relook and washout on day 1 Assessment & Plan:   Sev Mitral regurg s/p prior mitral clip, now s/p MVR Mod tricuspid regurg s/p TV repair Persistent cardioplegia postop- confirmed on RHC pHTN AoC HFrEF NICM pAF, flutter  S/p MVR and tricuspid ring 12/30 w/ Weldner. Back to OR for tamponade. Decompensated requiring intubation for FVO, renal failure, pHTN. 1/17 RHC with low filling pressures and started on milrinone, subsequently off. Con't to have vasopressor requirements (increased at night). Coox stable at 76 - con't NE for SBP > 90 - continue midodrine to 20mg  TID  - continue droxidopa 200mg  TID  - con't heparin gtt for afib  - con't amiodarone 200 BID per AHF  - IS (only getting 250cc volumes)  - OOB to chair as able   ?Sepsis unclear source - not convinced Thrombocytopenia stable Had pan scan 1/13 with patchy lower lobe opacities, no AP findings. No fever past 24hrs. No definite source. Lines were removed and replaced last week. Initially on linezolid/mero. WBC downtrending, no fever - con't meropenem - stop 1/25 - trend fever, wbc curve  Acute renal failure on CKD III 1/14 CRRT started, stopped 1/20., restarted 1/22  - needs eventual iHD but remains on NE  - continue CRRT per nephro, goal 50-100cc/hr ultrafiltrate if BP tolerates but currently running even - IR following for tunneled catheter placement - nephro following, appreciate input    Protein calorie malnutrition Physical deconditioning  Deep tissue injury on left buttocks approximately 2 cm in diameter purple discoloration - encourage PO intake, she has no appetite, consider appetite stimulant  - consider remeron  - mtvm - wound care - feeding supplement  - PT/OT  - OOB as able   Situational depression Sleep disturbance - melatonin PRN  - seroquel 25mg  daily  - zoloft 25mg  daily  - trazodone 100mg  PRN  Best Practice (right  click and "Reselect all SmartList Selections" daily)   Diet/type: Encourage PO, no appetite, supplement TF DVT prophylaxis systemic heparin  Pressure ulcer(s): L buttocks  GI prophylaxis: N/A Lines: HD cath  Foley:  N/A Code Status:  full code Last date of multidisciplinary goals of care discussion [per primary]  CC time: 35  minutes  Rutherford Guys, PA - [facility] For pager details, please see AMION or use Epic chat  After 1900, please call ELINK for cross coverage needs 12/23/2023, 7:56 AM

## 2023-12-23 NOTE — Progress Notes (Signed)
[facility] NEPHROLOGY PROGRESS NOTE  Assessment/ Plan: Pt is a 73 y.o. yo female  with a past medical history HTN, NICM, CHF, pulmonary hypertension, mitral and tricuspid valve disease who present w/ surgery for valvular disease c/b shock, AHRF, and AKI   1)  Anuric AKI on CKD 3A: Multifactorial etiology including cardiorenal syndrome and ischemic ATN due to shock.  Started CRRT on 1/2 for fluid overload.  The CRRT was held on 1/12 because of worsening leukocytosis and poor access with plan to have line holiday. IHD on 1/13 with IDH and no UF.   Resumed CRRT on 1/14-1/20 (new cath on 1/14) to manage volume At that time the CVP was only 5 and had been running even for CRRT was stopped .    No UOP -> BladderScan on 1/22 0mL  CVP 9 restarted CRRT 1/22 All 4K bath 400/100/1500 keeping even at this point Venous pressures starting to rise; if we have to replace the field overnight will start heparin 500 units through the circuit   On heparin drip and have not transition to Coumadin in anticipation for a tunneled catheter.  Asked VIR to convert the temporary catheter to a TC.  White count is downtrending and they want a normal white count before conversion.  VIR stated not an issue with DOAC   2) #Multifactorial shock: Vasoplegic and cardioplegia catheter surgery.   - midodrine incr to 20mg  TID, milrinone off on 1/20, droxidopa trial and norepi currently at 9. Cont mgmt per primary team   3) Acute exacerbation of heart failure with reduced ejection fraction: Volume overload. Optimized with CRRT   4) Severe MR and tricuspid valvular disease:  - Status post repair.  CT surgery managing   5) Postoperative cardiac tamponade:  - Continue monitoring per cardiology   6)  Hyponatremia: improved with CRRT    7) Anemia: Related to blood loss and compounded by AKI and critical illness.    - Transfusions per primary team.   - aranesp 40 mcg weekly on Tuesdays   8)  Thrombocytopenia:  switched from heparin to coumadin.    Subjective: Off CRRT on 1/20 (had been near even). Norepi 9  this AM -> off  milrinone on 1/20. No complaints, just very frustrated with her weakness.   Objective Vital signs in last 24 hours: Vitals:   12/23/23 1015 12/23/23 1030 12/23/23 1045 12/23/23 1100  BP: (!) 110/49 (!) 109/50 (!) 100/57 (!) 102/51  Pulse: 71 71 73 71  Resp: (!) 29 (!) 24 (!) 24 20  Temp:    (!) 97 F (36.1 C)  TempSrc:    Axillary  SpO2: 98% 98% 97% 97%  Weight:      Height:       Weight change: -2.1 kg  Intake/Output Summary (Last 24 hours) at 12/23/2023 1153 Last data filed at 12/23/2023 1100 Gross per 24 hour  Intake 2554.06 ml  Output 2630.1 ml  Net -76.04 ml       Labs: RENAL PANEL Recent Labs  Lab 12/19/23 0448 12/19/23 1522 12/20/23 0453 12/20/23 1634 12/21/23 0457 12/21/23 1700 12/22/23 0344 12/22/23 1701 12/23/23 0408  NA 132*   < > 133*   < > 131* 128* 131* 130* 130*  K 4.7   < > 4.4   < > 4.7 5.2* 5.1 4.6 4.4  CL 98   < > 100   < > 97* 96* 94* 99 96*  CO2 25   < > 27   < >  24 20* 23 22 26   GLUCOSE 173*   < > 157*   < > 142* 127* 132* 120* 150*  BUN 19   < > 16   < > 34* 41* 51* 34* 21  CREATININE 1.10*   < > 1.26*   < > 2.77* 3.40* 4.18* 2.50* 1.71*  CALCIUM 8.4*   < > 8.3*   < > 8.6* 8.8* 9.1 8.4* 7.9*  MG 2.7*  --  2.7*  --  2.6*  --  2.7*  --  2.5*  PHOS 3.0   < > 2.6   < > 5.0* 6.0* 7.9* 4.1 3.0  ALBUMIN 2.6*   < > 2.5*   < > 2.3* 2.5* 2.4* 2.4* 2.3*   < > = values in this interval not displayed.    Liver Function Tests: Recent Labs  Lab 12/22/23 0344 12/22/23 1701 12/23/23 0408  ALBUMIN 2.4* 2.4* 2.3*   No results for input(s): "LIPASE", "AMYLASE" in the last 168 hours. No results for input(s): "AMMONIA" in the last 168 hours.  CBC: Recent Labs    12/19/23 0448 12/20/23 0453 12/21/23 0459 12/22/23 0344 12/23/23 0408  HGB 9.4* 8.5* 8.2* 8.3* 7.8*  MCV 103.0* 106.5* 105.9* 104.7* 107.2*    Cardiac Enzymes: No  results for input(s): "CKTOTAL", "CKMB", "CKMBINDEX", "TROPONINI" in the last 168 hours. CBG: Recent Labs  Lab 12/22/23 1937 12/22/23 2347 12/23/23 0411 12/23/23 0737 12/23/23 1109  GLUCAP 127* 127* 158* 142* 117*    Iron Studies: No results for input(s): "IRON", "TIBC", "TRANSFERRIN", "FERRITIN" in the last 72 hours. Studies/Results: No results found.     Medications: Infusions:   prismasol BGK 4/2.5 400 mL/hr at 12/22/23 2132    prismasol BGK 4/2.5 400 mL/hr at 12/22/23 2130   sodium chloride Stopped (12/23/23 1050)   anticoagulant sodium citrate     feeding supplement (VITAL 1.5 CAL) 40 mL/hr at 12/23/23 1100   heparin 1,050 Units/hr (12/23/23 1100)   meropenem (MERREM) IV 280 mL/hr at 12/23/23 1100   norepinephrine (LEVOPHED) Adult infusion 8 mcg/min (12/23/23 1100)   prismasol BGK 4/2.5 1,500 mL/hr at 12/23/23 0908    Scheduled Medications:  amiodarone  200 mg Oral BID   ascorbic acid  250 mg Per Tube BID   Chlorhexidine Gluconate Cloth  6 each Topical Daily   darbepoetin (ARANESP) injection - NON-DIALYSIS  40 mcg Subcutaneous Q Tue-1800   droxidopa  200 mg Per Tube TID WC   feeding supplement (PROSource TF20)  60 mL Per Tube BID   fiber supplement (BANATROL TF)  60 mL Per Tube QID   Gerhardt's butt cream   Topical BID   influenza vaccine adjuvanted  0.5 mL Intramuscular Tomorrow-1000   insulin aspart  0-24 Units Subcutaneous Q4H   midodrine  20 mg Per Tube Q8H   multivitamin  1 tablet Per Tube BID   QUEtiapine  25 mg Per Tube QHS   sertraline  25 mg Per Tube Daily   zinc sulfate (50mg  elemental zinc)  220 mg Per Tube Daily    have reviewed scheduled and prn medications.  Physical Exam: General:NAD, comfortable Heart:normal rate, no rub Lungs:  Bilateral chest rise, no iwob Abdomen:soft, Non-tender, non-distended Extremities: Peripheral  edema trace, cool lower extremities  Dialysis Access: .  Right IJ temporary HD catheter was replaced by PCCM on  1/14.  Dariella Gillihan W 12/23/2023,11:53 AM  LOS: 24 days

## 2023-12-23 NOTE — Plan of Care (Signed)
  Problem: Clinical Measurements: Goal: Respiratory complications will improve Outcome: Progressing Goal: Cardiovascular complication will be avoided Outcome: Progressing   Problem: Activity: Goal: Risk for activity intolerance will decrease Outcome: Progressing   Problem: Nutrition: Goal: Adequate nutrition will be maintained Outcome: Progressing   Problem: Elimination: Goal: Will not experience complications related to bowel motility Outcome: Progressing   Problem: Safety: Goal: Ability to remain free from injury will improve Outcome: Progressing   Problem: Respiratory: Goal: Respiratory status will improve Outcome: Progressing   Problem: Skin Integrity: Goal: Wound healing without signs and symptoms of infection Outcome: Progressing

## 2023-12-24 ENCOUNTER — Inpatient Hospital Stay (HOSPITAL_COMMUNITY): Payer: PPO

## 2023-12-24 DIAGNOSIS — Z952 Presence of prosthetic heart valve: Secondary | ICD-10-CM | POA: Diagnosis not present

## 2023-12-24 DIAGNOSIS — E43 Unspecified severe protein-calorie malnutrition: Secondary | ICD-10-CM | POA: Diagnosis not present

## 2023-12-24 DIAGNOSIS — I5023 Acute on chronic systolic (congestive) heart failure: Secondary | ICD-10-CM | POA: Diagnosis not present

## 2023-12-24 DIAGNOSIS — R570 Cardiogenic shock: Secondary | ICD-10-CM | POA: Diagnosis not present

## 2023-12-24 DIAGNOSIS — N179 Acute kidney failure, unspecified: Secondary | ICD-10-CM | POA: Diagnosis not present

## 2023-12-24 HISTORY — PX: IR FLUORO GUIDE CV LINE LEFT: IMG2282

## 2023-12-24 HISTORY — PX: IR FLUORO GUIDE CV LINE RIGHT: IMG2283

## 2023-12-24 HISTORY — PX: IR US GUIDE VASC ACCESS RIGHT: IMG2390

## 2023-12-24 HISTORY — PX: IR US GUIDE VASC ACCESS LEFT: IMG2389

## 2023-12-24 LAB — RENAL FUNCTION PANEL
Albumin: 2.2 g/dL — ABNORMAL LOW (ref 3.5–5.0)
Albumin: 2.3 g/dL — ABNORMAL LOW (ref 3.5–5.0)
Anion gap: 7 (ref 5–15)
Anion gap: 11 (ref 5–15)
BUN: 13 mg/dL (ref 8–23)
BUN: 17 mg/dL (ref 8–23)
CO2: 27 mmol/L (ref 22–32)
CO2: 24 mmol/L (ref 22–32)
Calcium: 8.1 mg/dL — ABNORMAL LOW (ref 8.9–10.3)
Calcium: 7.9 mg/dL — ABNORMAL LOW (ref 8.9–10.3)
Chloride: 102 mmol/L (ref 98–111)
Chloride: 98 mmol/L (ref 98–111)
Creatinine, Ser: 1.12 mg/dL — ABNORMAL HIGH (ref 0.44–1.00)
Creatinine, Ser: 1.31 mg/dL — ABNORMAL HIGH (ref 0.44–1.00)
GFR, Estimated: 52 mL/min — ABNORMAL LOW (ref >60)
GFR, Estimated: 43 mL/min — ABNORMAL LOW (ref >60)
Glucose, Bld: 121 mg/dL — ABNORMAL HIGH (ref 70–99)
Glucose, Bld: 160 mg/dL — ABNORMAL HIGH (ref 70–99)
Phosphorus: 2.3 mg/dL — ABNORMAL LOW (ref 2.5–4.6)
Phosphorus: 2.3 mg/dL — ABNORMAL LOW (ref 2.5–4.6)
Potassium: 4.5 mmol/L (ref 3.5–5.1)
Potassium: 4.4 mmol/L (ref 3.5–5.1)
Sodium: 136 mmol/L (ref 135–145)
Sodium: 133 mmol/L — ABNORMAL LOW (ref 135–145)

## 2023-12-24 LAB — CBC
HCT: 25 % — ABNORMAL LOW (ref 36.0–46.0)
Hemoglobin: 7.4 g/dL — ABNORMAL LOW (ref 12.0–15.0)
MCH: 32 pg (ref 26.0–34.0)
MCHC: 29.6 g/dL — ABNORMAL LOW (ref 30.0–36.0)
MCV: 108.2 fL — ABNORMAL HIGH (ref 80.0–100.0)
Platelets: 105 10*3/uL — ABNORMAL LOW (ref 150–400)
RBC: 2.31 MIL/uL — ABNORMAL LOW (ref 3.87–5.11)
RDW: 25.7 % — ABNORMAL HIGH (ref 11.5–15.5)
WBC: 7.6 10*3/uL (ref 4.0–10.5)
nRBC: 0.5 % — ABNORMAL HIGH (ref 0.0–0.2)

## 2023-12-24 LAB — GLUCOSE, CAPILLARY
Glucose-Capillary: 102 mg/dL — ABNORMAL HIGH (ref 70–99)
Glucose-Capillary: 115 mg/dL — ABNORMAL HIGH (ref 70–99)
Glucose-Capillary: 122 mg/dL — ABNORMAL HIGH (ref 70–99)
Glucose-Capillary: 123 mg/dL — ABNORMAL HIGH (ref 70–99)
Glucose-Capillary: 127 mg/dL — ABNORMAL HIGH (ref 70–99)
Glucose-Capillary: 138 mg/dL — ABNORMAL HIGH (ref 70–99)
Glucose-Capillary: 139 mg/dL — ABNORMAL HIGH (ref 70–99)

## 2023-12-24 LAB — COOXEMETRY PANEL
Carboxyhemoglobin: 3 % — ABNORMAL HIGH (ref 0.5–1.5)
Methemoglobin: 1.1 % (ref 0.0–1.5)
O2 Saturation: 80.2 %
Total hemoglobin: 7.9 g/dL — ABNORMAL LOW (ref 12.0–16.0)

## 2023-12-24 LAB — MAGNESIUM: Magnesium: 2.6 mg/dL — ABNORMAL HIGH (ref 1.7–2.4)

## 2023-12-24 LAB — PROTIME-INR
INR: 1.2 (ref 0.8–1.2)
Prothrombin Time: 14.9 s (ref 11.4–15.2)

## 2023-12-24 LAB — HEPARIN LEVEL (UNFRACTIONATED)
Heparin Unfractionated: 0.42 [IU]/mL (ref 0.30–0.70)
Heparin Unfractionated: 0.36 [IU]/mL (ref 0.30–0.70)

## 2023-12-24 LAB — APTT: aPTT: 124 s — ABNORMAL HIGH (ref 24–36)

## 2023-12-24 MED ORDER — LIDOCAINE-EPINEPHRINE 1 %-1:100000 IJ SOLN
INTRAMUSCULAR | Status: AC
  Filled 2023-12-24: qty 1

## 2023-12-24 MED ORDER — MIDAZOLAM HCL 2 MG/2ML IJ SOLN
INTRAMUSCULAR | Status: AC | PRN
  Administered 2023-12-24 (×3): .5 mg via INTRAVENOUS

## 2023-12-24 MED ORDER — MIDAZOLAM HCL 2 MG/2ML IJ SOLN
INTRAMUSCULAR | Status: AC
  Filled 2023-12-24: qty 2

## 2023-12-24 MED ORDER — WARFARIN SODIUM 2.5 MG PO TABS: 2.5000 mg | ORAL_TABLET | Freq: Once | ORAL | Status: DC

## 2023-12-24 MED ORDER — CEFAZOLIN SODIUM-DEXTROSE 2-4 GM/100ML-% IV SOLN
INTRAVENOUS | Status: AC | PRN
  Administered 2023-12-24: 2 g via INTRAVENOUS

## 2023-12-24 MED ORDER — CEFAZOLIN SODIUM-DEXTROSE 2-4 GM/100ML-% IV SOLN
INTRAVENOUS | Status: AC
Start: 2023-12-24 — End: ?
  Filled 2023-12-24: qty 100

## 2023-12-24 MED ORDER — HEPARIN (PORCINE) 25000 UT/250ML-% IV SOLN
1000.0000 [IU]/h | INTRAVENOUS | Status: DC
  Administered 2023-12-24 – 2023-12-27 (×4): 1050 [IU]/h via INTRAVENOUS
  Administered 2023-12-28 – 2023-12-30 (×2): 1000 [IU]/h via INTRAVENOUS
  Filled 2023-12-24 (×6): qty 250

## 2023-12-24 MED ORDER — CEFAZOLIN SODIUM-DEXTROSE 2-4 GM/100ML-% IV SOLN: 2.0000 g | Freq: Once | INTRAVENOUS | Status: AC

## 2023-12-24 MED ORDER — LIDOCAINE HCL (PF) 1 % IJ SOLN
20.0000 mL | Freq: Once | INTRAMUSCULAR | Status: AC
Start: 2023-12-24 — End: 2023-12-24
  Administered 2023-12-24: 20 mL

## 2023-12-24 MED ORDER — FENTANYL CITRATE (PF) 100 MCG/2ML IJ SOLN
INTRAMUSCULAR | Status: AC
  Filled 2023-12-24: qty 2

## 2023-12-24 MED ORDER — WARFARIN SODIUM 2.5 MG PO TABS
2.5000 mg | ORAL_TABLET | Freq: Once | ORAL | Status: AC
  Administered 2023-12-24: 2.5 mg via ORAL
  Filled 2023-12-24: qty 1

## 2023-12-24 MED ORDER — WARFARIN - PHARMACIST DOSING INPATIENT
Freq: Every day | Status: DC
  Administered 2024-01-01: 1

## 2023-12-24 MED ORDER — HEPARIN SODIUM (PORCINE) 1000 UNIT/ML IJ SOLN
INTRAMUSCULAR | Status: AC
  Filled 2023-12-24: qty 10

## 2023-12-24 MED ORDER — HEPARIN SODIUM (PORCINE) 1000 UNIT/ML IJ SOLN
3200.0000 [IU] | Freq: Once | INTRAMUSCULAR | Status: AC
  Administered 2023-12-24: 3200 [IU]

## 2023-12-24 NOTE — Progress Notes (Signed)
Occupational Therapy Treatment Patient Details Name: Vanessa [name] MRN: [id number] DOB: Jul 06, 1951 Today's Date: 12/24/2023   History of present illness 73 yo F admitted 12/30 with DOE, increasing MR and TR. L[facility] without CAD. 12/30 MVR and tricuspid valve repair. Intubated 1/2-1/5. 1/2 CRRT started/stopped on 1/12. Pt HD line was removed on 1/13 and CRRT was re-started on 1/14-1/20.  PMhx: HTN, NICM, HFrEF (EF40-45%), Pulmonary HTN, CKD and  severe MR s/p prior mitral clip x 2 in 2020.   OT comments  OT session focused in training in therapeutic B UE exercises in standing with Stedy and within sternal precautions (see details below) to increase strength, activity tolerance, and balance for carryover to functional tasks, and in training in techniques for increased safety and independence with bed mobility and functional sit-to-stand transfers while adhering to sternal precautions. This session, pt demonstrates ability to complete bed mobility with Min to Mod assist +2 and sit-to-stand transfers with Mod assist +2 all while adhering to sternal precautions. Pt currently performs ADLs largely Set up to Max assist +2. Pt participated well in session and is making progress toward goals. RN present throughout session. Pt will benefit form continued acute skilled OT services to address deficits outlined below and increase safety and independence with functional tasks. Post acute discharge, pt will benefit from intensive inpatient skilled rehab services > 3 hours per day to maximize rehab potential.       If plan is discharge home, recommend the following:  Two people to help with walking and/or transfers;Two people to help with bathing/dressing/bathroom;Assistance with cooking/housework;Assist for transportation;Help with stairs or ramp for entrance   Equipment Recommendations  Other (comment) (defer to next level of care)    Recommendations for Other Services      Precautions / Restrictions  Precautions Precautions: Sternal;Fall;Other (comment) Precaution Booklet Issued: No Precaution Comments: cortrak, watch vitals Restrictions Weight Bearing Restrictions Per Provider Order: Yes RUE Weight Bearing Per Provider Order: Non weight bearing LUE Weight Bearing Per Provider Order: Non weight bearing Other Position/Activity Restrictions: sternal precautions       Mobility Bed Mobility Overal bed mobility: Needs Assistance Bed Mobility: Sit to Sidelying, Sidelying to Sit, Rolling Rolling: Min assist Sidelying to sit: Min assist, HOB elevated, +2 for safety/equipment     Sit to sidelying: Mod assist, +2 for physical assistance, +2 for safety/equipment General bed mobility comments: cues to adhere to sternal precautions    Transfers Overall transfer level: Needs assistance Equipment used: None Antony Salmon) Transfers: Sit to/from Stand Sit to Stand: Mod assist, +2 physical assistance, +2 safety/equipment (adhering to sternal precautions)           General transfer comment: Pt performed STS x3 with cues for adhering to sternal precautions and for technique and with use of Stedy to maintain balance once in standing     Balance Overall balance assessment: Needs assistance Sitting-balance support: Single extremity supported, No upper extremity supported, Feet supported Sitting balance-Leahy Scale: Good     Standing balance support: Single extremity supported, Bilateral upper extremity supported, Reliant on assistive device for balance Standing balance-Leahy Scale: Poor Standing balance comment: reliant on external support                           ADL either performed or assessed with clinical judgement   ADL Overall ADL's : Needs assistance/impaired  General ADL Comments: not addressed this session    Extremity/Trunk Assessment Upper Extremity Assessment Upper Extremity Assessment: Generalized weakness  (tested within precautions)   Lower Extremity Assessment Lower Extremity Assessment: Defer to PT evaluation        Vision       Perception     Praxis      Cognition Arousal: Alert Behavior During Therapy: WFL for tasks assessed/performed, Flat affect Overall Cognitive Status: Within Functional Limits for tasks assessed Area of Impairment: Problem solving, Awareness, Safety/judgement, Memory                     Memory: Decreased recall of precautions   Safety/Judgement: Decreased awareness of safety, Decreased awareness of deficits Awareness: Emergent Problem Solving: Slow processing General Comments: Pt initially frustrated with being asked to participate in OT due to fatigue secondary to a procedure earlier this day, but pt participating well by end of session.        Exercises Exercises: General Upper Extremity General Exercises - Upper Extremity Shoulder Flexion: AROM, Strengthening, Both, Other reps (comment), Standing (standing with unilateral UE support in [place]; 3 sets of 5 reps; increased activity tolerance; increased balance) Shoulder Extension: AROM, Strengthening, Both, Other reps (comment), Standing (standing with unilateral UE support in [place]; 3 sets of 5 reps; increased activity tolerance; increased balance) Elbow Flexion: AROM, Strengthening, Both, Other reps (comment), Standing (standing with unilateral UE support in [place]; 3 sets of 5 reps; increased activity tolerance; increased balance) Elbow Extension: AROM, Strengthening, Both, Other reps (comment), Standing (standing with unilateral UE support in [place]; 3 sets of 5 reps; increased activity tolerance; increased balance)    Shoulder Instructions       General Comments Pt reports no dizziness or lightheadness throughout session. VSS on 2L continuous O2 through nasal cannula throughout session. RNs present throughout session.    Pertinent Vitals/ Pain       Pain Assessment Pain Assessment:  Faces Faces Pain Scale: Hurts a little bit Pain Location: sternum; generalized with movement Pain Descriptors / Indicators: Dull, Aching Pain Intervention(s): Monitored during session, Repositioned  Home Living                                          Prior Functioning/Environment              Frequency  Min 1X/week        Progress Toward Goals  OT Goals(current goals can now be found in the care plan section)  Progress towards OT goals: Progressing toward goals  Acute Rehab OT Goals Patient Stated Goal: to feel better  Plan      Co-evaluation                 AM-PAC OT "6 Clicks" Daily Activity     Outcome Measure   Help from another person eating meals?: A Little Help from another person taking care of personal grooming?: A Little Help from another person toileting, which includes using toliet, bedpan, or urinal?: A Lot Help from another person bathing (including washing, rinsing, drying)?: A Lot Help from another person to put on and taking off regular upper body clothing?: A Lot Help from another person to put on and taking off regular lower body clothing?: A Lot 6 Click Score: 14    End of Session Equipment Utilized During Treatment: Gait belt;Other (comment) Antony Salmon)  OT  Visit Diagnosis: Muscle weakness (generalized) (M62.81);Other (comment) (decreased activity tolerance)   Activity Tolerance Patient tolerated treatment well   Patient Left in bed;with call bell/phone within reach;with nursing/sitter in room   Nurse Communication Mobility status;Other (comment) (RN presetn throughout session)        Time: [phone] OT Time Calculation (min): 35 min  Charges: OT General Charges $OT Visit: 1 Visit OT Treatments $Therapeutic Activity: 8-22 mins $Therapeutic Exercise: 8-22 mins  Joss Friedel "Orson Eva., OTR/L, MA Acute Rehab [phone]   Lendon Colonel 12/24/2023, 6:52 PM

## 2023-12-24 NOTE — Procedures (Signed)
Pre-procedure Diagnosis: ESRD; Poor venous access Post-procedure Diagnosis: Same  Successful placement of tunneled HD catheter with tips terminating within the superior aspect of the right atrium.   Successful placement of tunneled of tunneled CVC with tip terminating within the superior aspect of the right atrium.  Complications: None Immediate EBL: Trace  The catheter is ready for immediate use.   Katherina Right, MD Pager #: [phone]

## 2023-12-24 NOTE — Plan of Care (Signed)
Problem: Clinical Measurements: Goal: Ability to maintain clinical measurements within normal limits will improve Outcome: Progressing Goal: Will remain free from infection Outcome: Progressing Goal: Diagnostic test results will improve Outcome: Progressing Goal: Respiratory complications will improve Outcome: Progressing Goal: Cardiovascular complication will be avoided Outcome: Progressing   Problem: Coping: Goal: Level of anxiety will decrease Outcome: Progressing   Problem: Safety: Goal: Ability to remain free from injury will improve Outcome: Progressing

## 2023-12-24 NOTE — Progress Notes (Signed)
NAME:  Vanessa [name], MRN:  [id number], DOB:  25-Dec-1950, LOS: 25 ADMISSION DATE:  11/29/2023, CONSULTATION DATE:  11/29/23 REFERRING MD:  Dr. Leafy Ro, CHIEF COMPLAINT:  postop   History of Present Illness:  72yoF with PMH significant for HTN, NICM, HFrEF (EF40-45%), PHTN, CKD and hx of prior severe MR s/p prior mitral clip x 2 in 2020.  Pt with progressive DOE found to have increasing MR  and moderate TR despite adjustments in GDMT.  Not felt to be candidate for additional mitral clip.  Repeat LHC without CAD but showed moderate PHTN.  Underwent mitral valve replacement with tissue valve and tricuspid valve repair by Dr. Leafy Ro on 12/30.  PCCM consulted to assist with vent and medical management post-operatively in ICU.   Pertinent  Medical History  Never smoker, MR w/ previous Mitral clip x 2 (2020), NICM, HFrEF, PHTN, HTN, CKD3b  Significant Hospital Events: Including procedures, antibiotic start and stop dates in addition to other pertinent events   MVR by Dr. Leafy Ro; relook for tamponade/washout later in evening without obvious source 1/2 intubated for respiratory decompensation, iNO started. Vanc, meropenem started. CRRT started.  1/5 extubated 1/8 converted to NSR overnight 1/9 back to A.fib around 0200 1/11 R introducer out.  Unable to thread right subclavian catheter 1/12 remains weak still pressor dependent 1/13 repeat [facility] sent. Still on pressors..,  Zosyn changed to meropenem.  Chest abdomen and pelvis CT obtained : Moderate hemorrhagic pericardial effusion, 5.3 cm hematoma right thrombus. Arterial gas bilateral patchy opacities mild dependent posterior right upper lobe atelectasis was unremarkable layering gallstones  1/14 new right IJ HD catheter placed.  The echocardiogram Was obtained to better evaluate her EF is 35 to 40%.  The LV demonstrates regional wall motion abnormalities there is asymmetric left ventricular hypertrophy of the inferior lateral segment RV systolic  function moderate regular reduced left atrial wall dilated no mention of clot.  1/15 still on fairly high-dose norepinephrine 1/16 cont CRRT + low dose NE  1/17 RHC confirms low output state, restarted on milrinone 1/21: dc milrinone yesterday, ongoing increase NE requirements during nights. Started droxidopa in addition to midodrine to aid off NE.  1/22: levo up to 18 overnight (max so far), cvp 9, restarting crrt today. Received dose methylene blue, Cortisol 21 1/23 Levo down to 9, CVP 8  Interim History / Subjective:  On 7 NE. Coox 80, CVP 8 Remains +4.7L net.     prismasol BGK 4/2.5 400 mL/hr at 12/23/23 1705    prismasol BGK 4/2.5 400 mL/hr at 12/23/23 1705   sodium chloride 10 mL/hr at 12/24/23 0600   anticoagulant sodium citrate      ceFAZolin (ANCEF) IV     feeding supplement (VITAL 1.5 CAL) Stopped (12/24/23 0127)   heparin 1,050 Units/hr (12/24/23 0600)   meropenem (MERREM) IV Stopped (12/23/23 2351)   norepinephrine (LEVOPHED) Adult infusion 7 mcg/min (12/24/23 0600)   prismasol BGK 4/2.5 1,500 mL/hr at 12/23/23 1705     Objective   Blood pressure (!) 110/58, pulse 72, temperature 98.4 F (36.9 C), temperature source Oral, resp. rate 12, height 5\' 3"  (1.6 m), weight 77.1 kg, SpO2 98%. CVP:  [5 mmHg-7 mmHg] 5 mmHg      Intake/Output Summary (Last 24 hours) at 12/24/2023 0716 Last data filed at 12/24/2023 0600 Gross per 24 hour  Intake 1945.67 ml  Output 1944.1 ml  Net 1.57 ml   Filed Weights   12/20/23 0623 12/22/23 0500 12/23/23 0434  Weight: 73.6 kg  79.2 kg 77.1 kg   Examination:   General: older female, acute on chronically ill appearing, resting in bed, no acute distress  HEENT: [place]/at, perrla, no jvd, mmm, hd line rij  Pulm: diminished lower lobes, no wheezing, faint rhonchi Cardiac: RRR, no M/R/G Abdomen: rounded, soft, non-tender Extremities: no edema  Neuro: globally weak, rass 0, oriented, non focal  Resolved Hospital Problem list :   Postoperative  vent management Postop tamponade resolved after relook and washout on day 1 Assessment & Plan:   Sev Mitral regurg s/p prior mitral clip, now s/p MVR Mod tricuspid regurg s/p TV repair Persistent cardioplegia postop- confirmed on RHC pHTN AoC HFrEF NICM pAF, flutter  S/p MVR and tricuspid ring 12/30 w/ Weldner. Back to OR for tamponade. Decompensated requiring intubation for FVO, renal failure, pHTN. 1/17 RHC with low filling pressures and started on milrinone, subsequently off. Con't to have vasopressor requirements (increased at night). Coox stable at 76 - con't NE for SBP > 90, will need additional access after current HD trialysis catheter is removed. Have placed an order for midline as additional CVL not ideal here - continue midodrine to 20mg  TID  - continue droxidopa 200mg  TID  - con't heparin gtt for afib, awaiting tunnelled HD cath placement today before transitioning to Warfarin - con't amiodarone 200 BID per AHF  - IS (only getting 250cc volumes)  - OOB to chair as able   ?Sepsis unclear source - not convinced. WBC/temp stable. Thrombocytopenia stable Had pan scan 1/13 with patchy lower lobe opacities, no AP findings. No fever past 24hrs. No definite source. Lines were removed and replaced last week. Initially on linezolid/mero. WBC downtrending, no fever - con't meropenem - stop 1/25 - trend fever, wbc curve  Acute renal failure on CKD III 1/14 CRRT started, stopped 1/20., restarted 1/22  - needs eventual iHD but remains on NE  - continue CRRT per nephro, goal 50-100cc/hr ultrafiltrate if BP tolerates but currently running even - IR planning for tunneled catheter placement today - nephro following, appreciate input    Protein calorie malnutrition Physical deconditioning  Deep tissue injury on left buttocks approximately 2 cm in diameter purple discoloration - encourage PO intake, she has no appetite, consider appetite stimulant  - consider remeron  - mtvm - wound  care - feeding supplement  - PT/OT  - OOB as able   Situational depression Sleep disturbance - melatonin PRN  - seroquel 25mg  daily  - zoloft 25mg  daily  - trazodone 100mg  PRN  Best Practice (right click and "Reselect all SmartList Selections" daily)   Diet/type: Encourage PO, no appetite, supplement TF DVT prophylaxis systemic heparin  Pressure ulcer(s): L buttocks  GI prophylaxis: N/A Lines: HD cath  Foley:  N/A Code Status:  full code Last date of multidisciplinary goals of care discussion [per primary]  CC time: 30 minutes  Rutherford Guys, PA - [facility] For pager details, please see AMION or use Epic chat  After 1900, please call ELINK for cross coverage needs 12/24/2023, 7:16 AM

## 2023-12-24 NOTE — Progress Notes (Signed)
[facility] NEPHROLOGY PROGRESS NOTE  Assessment/ Plan: Pt is a 73 y.o. yo female  with a past medical history HTN, NICM, CHF, pulmonary hypertension, mitral and tricuspid valve disease who present w/ surgery for valvular disease c/b shock, AHRF, and AKI   1)  Anuric AKI on CKD 3A: Multifactorial etiology including cardiorenal syndrome and ischemic ATN due to shock.  Started CRRT on 1/2 for fluid overload.  The CRRT was held on 1/12 because of worsening leukocytosis and poor access with plan to have line holiday. IHD on 1/13 with IDH and no UF.   Resumed CRRT on 1/14-1/20 (new cath on 1/14) to manage volume. CRRT 1/22-   No UOP -> BladderScan on 1/22 0mL  About to restart CRRT after RIJ TC; appreciate VIR placing. Now have left sided central line and will check CVP.   2) #Multifactorial shock: Vasoplegic and cardioplegia catheter surgery.   - midodrine incr to 20mg  TID, milrinone off on 1/20, droxidopa trial and norepi currently at 9. Cont mgmt per primary team   3) Acute exacerbation of heart failure with reduced ejection fraction: Volume overload. Optimized with CRRT   4) Severe MR and tricuspid valvular disease:  - Status post repair.  CT surgery managing   5) Postoperative cardiac tamponade:  - Continue monitoring per cardiology   6)  Hyponatremia: improved with CRRT    7) Anemia: Related to blood loss and compounded by AKI and critical illness.    - Transfusions per primary team.   - aranesp 40 mcg weekly on Tuesdays   8)  Thrombocytopenia: switched from heparin to coumadin.    Subjective: Off CRRT on 1/20 (had been near even), off  milrinone on 1/20. No complaints, frustrated with her weakness.   Objective Vital signs in last 24 hours: Vitals:   12/24/23 0945 12/24/23 1000 12/24/23 1015 12/24/23 1030  BP: (!) 101/47     Pulse: 79 81 77 81  Resp: (!) 23 (!) 21 (!) 24 (!) 26  Temp:      TempSrc:      SpO2: 96% 95% 94% 95%  Weight:      Height:        Weight change:   Intake/Output Summary (Last 24 hours) at 12/24/2023 1112 Last data filed at 12/24/2023 0700 Gross per 24 hour  Intake 1517.86 ml  Output 1532.1 ml  Net -14.24 ml       Labs: RENAL PANEL Recent Labs  Lab 12/20/23 0453 12/20/23 1634 12/21/23 0457 12/21/23 1700 12/22/23 0344 12/22/23 1701 12/23/23 0408 12/23/23 1623 12/24/23 0503  NA 133*   < > 131*   < > 131* 130* 130* 133* 136  K 4.4   < > 4.7   < > 5.1 4.6 4.4 4.5 4.5  CL 100   < > 97*   < > 94* 99 96* 99 102  CO2 27   < > 24   < > 23 22 26 26 27   GLUCOSE 157*   < > 142*   < > 132* 120* 150* 125* 121*  BUN 16   < > 34*   < > 51* 34* 21 19 13   CREATININE 1.26*   < > 2.77*   < > 4.18* 2.50* 1.71* 1.47* 1.12*  CALCIUM 8.3*   < > 8.6*   < > 9.1 8.4* 7.9* 8.1* 8.1*  MG 2.7*  --  2.6*  --  2.7*  --  2.5*  --  2.6*  PHOS 2.6   < >  5.0*   < > 7.9* 4.1 3.0 2.5 2.3*  ALBUMIN 2.5*   < > 2.3*   < > 2.4* 2.4* 2.3* 2.3* 2.2*   < > = values in this interval not displayed.    Liver Function Tests: Recent Labs  Lab 12/23/23 0408 12/23/23 1623 12/24/23 0503  ALBUMIN 2.3* 2.3* 2.2*   No results for input(s): "LIPASE", "AMYLASE" in the last 168 hours. No results for input(s): "AMMONIA" in the last 168 hours.  CBC: Recent Labs    12/21/23 0459 12/22/23 0344 12/23/23 0408 12/23/23 1623 12/24/23 0503  HGB 8.2* 8.3* 7.8* 7.4* 7.4*  MCV 105.9* 104.7* 107.2* 108.3* 108.2*    Cardiac Enzymes: No results for input(s): "CKTOTAL", "CKMB", "CKMBINDEX", "TROPONINI" in the last 168 hours. CBG: Recent Labs  Lab 12/23/23 1537 12/23/23 1955 12/23/23 2359 12/24/23 0335 12/24/23 0736  GLUCAP 111* 125* 139* 115* 122*    Iron Studies: No results for input(s): "IRON", "TIBC", "TRANSFERRIN", "FERRITIN" in the last 72 hours. Studies/Results: No results found.     Medications: Infusions:   prismasol BGK 4/2.5 400 mL/hr at 12/23/23 1705    prismasol BGK 4/2.5 400 mL/hr at 12/23/23 1705   sodium chloride 10  mL/hr at 12/24/23 0600   anticoagulant sodium citrate     feeding supplement (VITAL 1.5 CAL) Stopped (12/24/23 0127)   heparin 1,050 Units/hr (12/24/23 0600)   heparin 1,050 Units/hr (12/24/23 1048)   meropenem (MERREM) IV 2 g (12/24/23 1019)   norepinephrine (LEVOPHED) Adult infusion 7 mcg/min (12/24/23 0600)   prismasol BGK 4/2.5 1,500 mL/hr at 12/23/23 1705    Scheduled Medications:  amiodarone  200 mg Oral BID   ascorbic acid  250 mg Per Tube BID   Chlorhexidine Gluconate Cloth  6 each Topical Daily   darbepoetin (ARANESP) injection - NON-DIALYSIS  40 mcg Subcutaneous Q Tue-1800   droxidopa  200 mg Per Tube TID WC   feeding supplement (PROSource TF20)  60 mL Per Tube BID   fiber supplement (BANATROL TF)  60 mL Per Tube QID   Gerhardt's butt cream   Topical BID   influenza vaccine adjuvanted  0.5 mL Intramuscular Tomorrow-1000   insulin aspart  0-24 Units Subcutaneous Q4H   midodrine  20 mg Per Tube Q8H   multivitamin  1 tablet Per Tube BID   QUEtiapine  25 mg Per Tube QHS   sertraline  25 mg Per Tube Daily   zinc sulfate (50mg  elemental zinc)  220 mg Per Tube Daily    have reviewed scheduled and prn medications.  Physical Exam: General:NAD, comfortable Heart:normal rate, no rub Lungs:  Bilateral chest rise, no iwob Abdomen:soft, Non-tender, non-distended Extremities: Peripheral  edema trace, cool lower extremities  Dialysis Access: .  Right IJ tunneled HD catheter (1/24)  Fionna Merriott W 12/24/2023,11:12 AM  LOS: 25 days

## 2023-12-24 NOTE — Progress Notes (Signed)
PHARMACY - ANTICOAGULATION CONSULT NOTE  Pharmacy Consult for IV heparin Indication: atrial fibrillation  No Known Allergies  Patient Measurements: Height: 5\' 3"  (160 cm) Weight: 77.1 kg (169 lb 15.6 oz) IBW/kg (Calculated) : 52.4 Heparin Dosing Weight: ~ 70 kg  Vital Signs: Temp: 97.5 F (36.4 C) (01/24 0800) Temp Source: Oral (01/24 0800) BP: 99/55 (01/24 0935) Pulse Rate: 80 (01/24 0935)  Labs: Recent Labs    12/22/23 1701 12/23/23 0408 12/23/23 1623 12/24/23 0503  HGB  --  7.8* 7.4* 7.4*  HCT  --  25.4* 24.8* 25.0*  PLT  --  95* 104* 105*  APTT  --   --   --  124*  HEPARINUNFRC 0.37 0.40  --  0.42  CREATININE 2.50* 1.71* 1.47* 1.12*    Estimated Creatinine Clearance: 44.7 mL/min (A) (by C-G formula based on SCr of 1.12 mg/dL (H)).  Warfarin doses received (INR): 1/15 - 2.5 (1.1) 1/16 - 2.5 (1.1) 1/17 - 2.5 (1.2) 1/18 - 2.5 (1.2) 1/19 - 5 (1.3) 1/20 - 5 (1.3) 1/21 >> 1/24 - held for tunneled access   Assessment: 73 yo female s/p scheduled bioprosthetic mitral valve replacement with tissue valve 12/30 now with afib, pharmacy asked to dose anticoagulation with IV heparin. Spontaneously converted 1/7; flipped back to AF overnight 1/9 0300. She is noted on CRRT. Baseline INR 1.1, slow uptrend to 1.3 s/p 6 days warfarin therapy, total dose 20 mg, then held 1/21-24 for tunneled access plans. Patient now s/p [facility] and tunneled central line placement 1/24 AM.   HL therapeutic this morning at 0.42 on 1050 units/hr since 1/22. CBC remains low-stable, Still requiring norepinephrine, midodrine, and droxidopa (added 1/21) for hemodynamic tolerance of dialysis. Per NP with VIR okay to resume anticoagulation.   Goal of Therapy:  Heparin level 0.3-0.5 units/ml INR goal 2 - 3 (bMVR) Monitor platelets by anticoagulation protocol: Yes   Plan:  Resume heparin at 1050 units/hr, no bolus with persistent anemia F/u if okay to resume warfarin with multidisciplinary team F/u 8 hour  heparin level Daily heparin level, CBC and PRN (stopped monitoring aPTT with downtrending Tbili and several days relatively stable)  Rutherford Nail, PharmD PGY2 Critical Care Pharmacy Resident 12/24/2023  10:09 AM

## 2023-12-24 NOTE — Progress Notes (Addendum)
PHARMACY - ANTICOAGULATION CONSULT NOTE  Pharmacy Consult for IV heparin Indication: atrial fibrillation  No Known Allergies  Patient Measurements: Height: 5\' 3"  (160 cm) Weight: 77.1 kg (169 lb 15.6 oz) IBW/kg (Calculated) : 52.4 Heparin Dosing Weight: ~ 70 kg  Vital Signs: Temp: 98.1 F (36.7 C) (01/24 1130) Temp Source: Oral (01/24 1130) BP: 88/52 (01/24 1345) Pulse Rate: 82 (01/24 1345)  Labs: Recent Labs    12/22/23 1701 12/23/23 0408 12/23/23 1623 12/24/23 0503  HGB  --  7.8* 7.4* 7.4*  HCT  --  25.4* 24.8* 25.0*  PLT  --  95* 104* 105*  APTT  --   --   --  124*  HEPARINUNFRC 0.37 0.40  --  0.42  CREATININE 2.50* 1.71* 1.47* 1.12*    Estimated Creatinine Clearance: 44.7 mL/min (A) (by C-G formula based on SCr of 1.12 mg/dL (H)).  Warfarin doses received (INR): 1/15 - 2.5 (1.1) 1/16 - 2.5 (1.1) 1/17 - 2.5 (1.2) 1/18 - 2.5 (1.2) 1/19 - 5 (1.3) 1/20 - 5 (1.3) 1/21 >> 1/24 - held for tunneled access   Assessment: 73 yo female s/p scheduled bioprosthetic mitral valve replacement with tissue valve 12/30 now with afib, pharmacy asked to dose anticoagulation with IV heparin. Spontaneously converted 1/7; flipped back to AF overnight 1/9 0300. She is noted on CRRT. Baseline INR 1.1, slow uptrend to 1.3 s/p 6 days warfarin therapy, total dose 20 mg, then held 1/21-24 for tunneled access plans. Patient now s/p [facility] and tunneled central line placement 1/24 AM.   HL therapeutic this morning at 0.42 on 1050 units/hr since 1/22. CBC remains low-stable, Still requiring norepinephrine, midodrine, and droxidopa (added 1/21) for hemodynamic tolerance of dialysis. Per NP with VIR okay to resume anticoagulation.   Goal of Therapy:  Heparin level 0.3-0.5 units/ml INR goal 2 - 3 (bMVR) Monitor platelets by anticoagulation protocol: Yes   Plan:  Resume heparin at 1050 units/hr, no bolus with persistent anemia F/u if okay to resume warfarin with multidisciplinary team F/u 8 hour  heparin level Daily heparin level, CBC and PRN (stopped monitoring aPTT with downtrending Tbili and several days relatively stable) ______________________________________  1/24 ADDENDUM:   -Pharmacy consulted to start warfarin.   -Notable DDI's include amiodarone 200 mg BID for Afib, TF's (Vital 1.5) @40  mL/hr (~70 mcg Vit K / day).   -Baseline INR 1.2  PLAN - -Warfarin 2.5 mg PO x 1 -Daily INR, CBC -Monitor closely while on CRRT ________________________________________  -Heparin level 0.36, therapeutic  -Continue heparin 1050 units/hr -F/u AM labs  Trixie Rude, PharmD Clinical Pharmacist 12/24/2023  2:47 PM

## 2023-12-24 NOTE — Progress Notes (Addendum)
Patient ID: Vanessa [name], female   DOB: [birth year], 73 y.o.   MRN: [id number]      Advanced Heart Failure Rounding Note  Cardiologist: Dr. Shirlee Latch  Chief Complaint: Cardiogenic Shock  Subjective:   12/30 S/P MVR (tissue) and TV ring and same day return to OR for tamponade/bleeding. 1/2: Progressive volume overload with poor UOP, CVVH started but ended up re-intubated.   Extubated 12/05/23 1/12: CVVH stopped 1/13: iHD, HD line removed 1/14: CRRT Restarted.  1/17: RHC mean RA 2, PA 47/20, mean PCWP 5, CI 1.74, PVR 8, PAPi 9.  Milrinone 0.125 started. 1/20 CRRT stopped.  1/22 methylene blue for persistent vasoplegia   Remains on NE 7 + Midodrine 20 tid + droxidopa. SBPs mid 90s.   Remains anuric. On CRRT running even. CVP 7-8. No dyspnea. Going for tunneled HD cath today.   Co-ox 80%  Feels better this morning. Sleep better last night. Still a bit weak/fatigued but no other complaints.   Objective:   Weight Range: 77.1 kg Body mass index is 30.11 kg/m.   Vital Signs:   Temp:  [97 F (36.1 C)-98.9 F (37.2 C)] 98.4 F (36.9 C) (01/24 0356) Pulse Rate:  [67-83] 72 (01/24 0700) Resp:  [12-37] 12 (01/24 0700) BP: (75-134)/(25-90) 110/58 (01/24 0700) SpO2:  [75 %-98 %] 98 % (01/24 0700) Last BM Date : 12/22/23    Weight change: Filed Weights   12/20/23 0623 12/22/23 0500 12/23/23 0434  Weight: 73.6 kg 79.2 kg 77.1 kg   Intake/Output:   Intake/Output Summary (Last 24 hours) at 12/24/2023 0718 Last data filed at 12/24/2023 0700 Gross per 24 hour  Intake 1945.67 ml  Output 1977.1 ml  Net -31.43 ml    Physical Exam   CVP 7-8  General:  fatigued appearing. No respiratory difficulty HEENT: normal Neck: supple. + RIJ HD cath, CVP 7-8 Carotids 2+ bilat; no bruits. No lymphadenopathy or thyromegaly appreciated. Cor: PMI nondisplaced. Regular rate & rhythm. 2/6 murmur LLSB, sternal wound ok  Lungs:  decreased BS at the bases bilaterally  Abdomen: soft, nontender,  nondistended. No hepatosplenomegaly. No bruits or masses. Good bowel sounds. Extremities: no cyanosis, clubbing, rash, edema Neuro: alert & oriented x 3, cranial nerves grossly intact. moves all 4 extremities w/o difficulty. Affect pleasant.  Telemetry   NSR 70s, personally reviewed.  Labs    CBC Recent Labs    12/23/23 1623 12/24/23 0503  WBC 9.3 7.6  HGB 7.4* 7.4*  HCT 24.8* 25.0*  MCV 108.3* 108.2*  PLT 104* 105*   Basic Metabolic Panel Recent Labs    25/36/64 0408 12/23/23 1623 12/24/23 0503  NA 130* 133* 136  K 4.4 4.5 4.5  CL 96* 99 102  CO2 26 26 27   GLUCOSE 150* 125* 121*  BUN 21 19 13   CREATININE 1.71* 1.47* 1.12*  CALCIUM 7.9* 8.1* 8.1*  MG 2.5*  --  2.6*  PHOS 3.0 2.5 2.3*   Liver Function Tests Recent Labs    12/23/23 1623 12/24/23 0503  ALBUMIN 2.3* 2.2*   No results for input(s): "LIPASE", "AMYLASE" in the last 72 hours. Cardiac Enzymes No results for input(s): "CKTOTAL", "CKMB", "CKMBINDEX", "TROPONINI" in the last 72 hours.  BNP: BNP (last 3 results) Recent Labs    05/26/23 1610 08/26/23 1103 09/22/23 0905  BNP 778.0* 781.0* 926.0*   Other results:  Imaging   No results found.  Medications:   Scheduled Medications:  amiodarone  200 mg Oral BID   ascorbic acid  250  mg Per Tube BID   Chlorhexidine Gluconate Cloth  6 each Topical Daily   darbepoetin (ARANESP) injection - NON-DIALYSIS  40 mcg Subcutaneous Q Tue-1800   droxidopa  200 mg Per Tube TID WC   feeding supplement (PROSource TF20)  60 mL Per Tube BID   fiber supplement (BANATROL TF)  60 mL Per Tube QID   Gerhardt's butt cream   Topical BID   influenza vaccine adjuvanted  0.5 mL Intramuscular Tomorrow-1000   insulin aspart  0-24 Units Subcutaneous Q4H   midodrine  20 mg Per Tube Q8H   multivitamin  1 tablet Per Tube BID   QUEtiapine  25 mg Per Tube QHS   sertraline  25 mg Per Tube Daily   zinc sulfate (50mg  elemental zinc)  220 mg Per Tube Daily    Infusions:    prismasol BGK 4/2.5 400 mL/hr at 12/23/23 1705    prismasol BGK 4/2.5 400 mL/hr at 12/23/23 1705   sodium chloride 10 mL/hr at 12/24/23 0600   anticoagulant sodium citrate      ceFAZolin (ANCEF) IV     feeding supplement (VITAL 1.5 CAL) Stopped (12/24/23 0127)   heparin 1,050 Units/hr (12/24/23 0600)   meropenem (MERREM) IV Stopped (12/23/23 2351)   norepinephrine (LEVOPHED) Adult infusion 7 mcg/min (12/24/23 0600)   prismasol BGK 4/2.5 1,500 mL/hr at 12/23/23 1705    PRN Medications: sodium chloride, alteplase, anticoagulant sodium citrate, artificial tears, bisacodyl **OR** bisacodyl, heparin, heparin, HYDROmorphone (DILAUDID) injection, levalbuterol, lidocaine (PF), lidocaine-prilocaine, lip balm, melatonin, ondansetron (ZOFRAN) IV, mouth rinse, oxyCODONE, pentafluoroprop-tetrafluoroeth, pneumococcal 20-valent conjugate vaccine, sodium chloride, traZODone  Patient Profile   Vanessa [name] is a 73 y.o. with history of nonischemic cardiomyopathy and severe MR s/p Mitral Clip x2 (2020), pHTN, and CKD IIIb.   Assessment/Plan   1. Post Cardiotomy Cardiogenic & septic shock: Bioprosthetic MVR and tricuspid ring 11/29/23 with Dr. Leafy Ro. Pre-op cath with no significant CAD.  Pre-op echo with EF 40-45%, severe MR. Went back to the OR with post-op tamponade for clean-out.  Echo 12/31 showed EF 25-30%, moderate RV dysfunction, s/p bioprosthetic mitral valve replacement with trivial MR and tricuspid valve repair with trivial TR. Worsened on 1/2 and re-intubated in setting of volume overload, oliguric renal failure &  pulmonary hypertension. Extubated on 12/04/22. 1/13 CT C/A/P  moderate hemorrhagic pericardial effusion and 5.3 cm hematoma right heart border.  Repeat echo no tamponade. Required CRRT for volume removal.  CVVH stopped 1/12 then restarted 01/14 for volume removal. RHC 1/17 showed RA 3 PA 47/20 (30) PCWP 5 PAPi 9 CI 1.74.  - c/w vasopalegia. On NE 7, high dose midodrine + droxidopa.  Methylene blu given 1/22.  - Continue to wean NE as tolerated. Keep BP goal to SBP 90.  - continue midodrine 20 tid + droxidopa - encouraged TED hoses for compression. May need to add abdominal binder  - Remains anuric. CVP 7-8. On CRRT, keep even for now  2. Mitral regurgitation:  TEE in 11/19 with severe MR, restricted posterior leaflet.  She had Mitraclip x 2 in 1/20. Post-op echo showed mild MR and mild mitral stenosis.   Echo in 9/24 showed MV s/p Mitraclip x 2 with mean gradient 6 mmHg and severe mitral regurgitation. TEE was then done to evaluate, showing 2 Mitraclips in place with mean gradient 4 mmHg and severe eccentric MR from between the 2 clips with ERO 0.54 cm^2.  She was not thought to be a candidate for an additional Mitraclip due to  lack of room between the two existing clips. R/LHC (11/24) showed moderate mixed pulmonary arterial and pulmonary venous hypertension likely due to mitral valve regurgitation.  S/p MV replacement and Tricuspid repair on 11/29/23 with Dr. Leafy Ro.  - Last echo stable valves.  3. Ventricular ectopy:  - Continue amiodarone 200 mg bid  4. Pulmonary hypertension: Pre-op RHC (11/24) with PVR 4.5, with moderate mixed pulmonary arterial and pulmonary venous hypertension likely due to mitral valve regurgitation. Now s/p MVR, PA pressure severely elevated post-op.  Suspect still mixed PAH/PVH with elevated PCWP.  NO was tried at 10 ppm but did not have much effect so now off.  Will hold off on pulmonary vasodilators; small clinical trials demonstrate no improvement and some markers of harm with PDE5i in PH in the setting of mitral valve disease. RHC 1/17 with mild pulmonary arterial hypertension.  5. 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 TSH was normal in 8/24. Follows with Endocrinology 6. AKI on CKD stage 3:  1/14 CRRT restarted. CRRT stopped 1/20 and restarted 1/22. Remains anuric.  - CVP 7-8. Keep even for now on CRRT - plan tunneled HD cath today   - Nephrology following.   7. Blood loss anemia - Hgb 7.4, monitor closely  8. Complete heart block: She had post-op CHB. Resolved.  9. Hypoxic / Hypercapnic respiratory failure: Reintubated 01/02.  Extubated 01/05.  -Now on room air with stable sats.  10. Paroxsymal Atrial fibrillation:  - In SR.  - Continue po amiodarone.   - On Heparin drip. Pharmacy dosing, discussing.  11. ID: Suspected septic shock component, no definite source. All lines changed out last week and started on linezolid/meropenem, now just on meropenem.  Afebrile. Leukocytosis resolved  - Continue meropenem. Stop date 1/25 -12. Thrombocytopenia: Suspect due to sepsis. Platelets trending back up 105K today.  13. Deconditioning - Continue PT.  14. Protein-calorie malnutrition - continue TFs   Brittainy Simmons PA-C  12/24/2023 7:18 AM  Agree with above.   Remains on NE 7 for BP support. Anuric. On CVVHD keeping even. CVP 7-8  Feels weak. Depressed. Denies CP or SOB. On heparin   General:  Weak appearing. No resp difficulty HEENT: normal Neck: supple. JVP 7 .+ HD cath. Cor:  Regular rate & rhythm. No rubs, gallops or murmurs. Lungs: clear Abdomen: soft, nontender, nondistended. No hepatosplenomegaly. No bruits or masses. Good bowel sounds. Extremities: no cyanosis, clubbing, rash, tr edema Neuro: alert & orientedx3, cranial nerves grossly intact. moves all 4 extremities w/o difficulty. Affect flat but pleasant  Continue to wean NE as tolerated for SBP >= 90. Continue midodrine/droxdopa. Planned tunnel cath today. Start warfarin. (Discussed with PharmD) Continue abx for one more day. Ambulate.   CRITICAL CARE Performed by: Arvilla Meres  Total critical care time: 34 minutes  Critical care time was exclusive of separately billable procedures and treating other patients.  Critical care was necessary to treat or prevent imminent or life-threatening deterioration.  Critical care was time spent  personally by me (independent of midlevel providers or residents) on the following activities: development of treatment plan with patient and/or surrogate as well as nursing, discussions with consultants, evaluation of patient's response to treatment, examination of patient, obtaining history from patient or surrogate, ordering and performing treatments and interventions, ordering and review of laboratory studies, ordering and review of radiographic studies, pulse oximetry and re-evaluation of patient's condition.  Arvilla Meres, MD  10:45 PM

## 2023-12-25 DIAGNOSIS — D696 Thrombocytopenia, unspecified: Secondary | ICD-10-CM | POA: Diagnosis not present

## 2023-12-25 DIAGNOSIS — D649 Anemia, unspecified: Secondary | ICD-10-CM | POA: Diagnosis not present

## 2023-12-25 DIAGNOSIS — R739 Hyperglycemia, unspecified: Secondary | ICD-10-CM

## 2023-12-25 DIAGNOSIS — Z952 Presence of prosthetic heart valve: Secondary | ICD-10-CM | POA: Diagnosis not present

## 2023-12-25 DIAGNOSIS — R570 Cardiogenic shock: Secondary | ICD-10-CM | POA: Diagnosis not present

## 2023-12-25 LAB — RENAL FUNCTION PANEL
Albumin: 2.2 g/dL — ABNORMAL LOW (ref 3.5–5.0)
Albumin: 2.3 g/dL — ABNORMAL LOW (ref 3.5–5.0)
Anion gap: 5 (ref 5–15)
Anion gap: 7 (ref 5–15)
BUN: 13 mg/dL (ref 8–23)
BUN: 13 mg/dL (ref 8–23)
CO2: 26 mmol/L (ref 22–32)
CO2: 27 mmol/L (ref 22–32)
Calcium: 7.9 mg/dL — ABNORMAL LOW (ref 8.9–10.3)
Calcium: 8.3 mg/dL — ABNORMAL LOW (ref 8.9–10.3)
Chloride: 103 mmol/L (ref 98–111)
Chloride: 102 mmol/L (ref 98–111)
Creatinine, Ser: 1.05 mg/dL — ABNORMAL HIGH (ref 0.44–1.00)
Creatinine, Ser: 0.99 mg/dL (ref 0.44–1.00)
GFR, Estimated: 56 mL/min — ABNORMAL LOW (ref >60)
GFR, Estimated: >60 mL/min (ref >60)
Glucose, Bld: 143 mg/dL — ABNORMAL HIGH (ref 70–99)
Glucose, Bld: 136 mg/dL — ABNORMAL HIGH (ref 70–99)
Phosphorus: 2.2 mg/dL — ABNORMAL LOW (ref 2.5–4.6)
Phosphorus: 1.8 mg/dL — ABNORMAL LOW (ref 2.5–4.6)
Potassium: 4.4 mmol/L (ref 3.5–5.1)
Potassium: 4.6 mmol/L (ref 3.5–5.1)
Sodium: 134 mmol/L — ABNORMAL LOW (ref 135–145)
Sodium: 136 mmol/L (ref 135–145)

## 2023-12-25 LAB — MAGNESIUM: Magnesium: 2.5 mg/dL — ABNORMAL HIGH (ref 1.7–2.4)

## 2023-12-25 LAB — CBC
HCT: 25.1 % — ABNORMAL LOW (ref 36.0–46.0)
Hemoglobin: 7.5 g/dL — ABNORMAL LOW (ref 12.0–15.0)
MCH: 32.8 pg (ref 26.0–34.0)
MCHC: 29.9 g/dL — ABNORMAL LOW (ref 30.0–36.0)
MCV: 109.6 fL — ABNORMAL HIGH (ref 80.0–100.0)
Platelets: 118 10*3/uL — ABNORMAL LOW (ref 150–400)
RBC: 2.29 MIL/uL — ABNORMAL LOW (ref 3.87–5.11)
RDW: 25.2 % — ABNORMAL HIGH (ref 11.5–15.5)
WBC: 8.5 10*3/uL (ref 4.0–10.5)
nRBC: 0.6 % — ABNORMAL HIGH (ref 0.0–0.2)

## 2023-12-25 LAB — COOXEMETRY PANEL
Carboxyhemoglobin: 2.5 % — ABNORMAL HIGH (ref 0.5–1.5)
Methemoglobin: 0.8 % (ref 0.0–1.5)
O2 Saturation: 72 %
Total hemoglobin: 7.3 g/dL — ABNORMAL LOW (ref 12.0–16.0)

## 2023-12-25 LAB — HEPARIN LEVEL (UNFRACTIONATED): Heparin Unfractionated: 0.49 [IU]/mL (ref 0.30–0.70)

## 2023-12-25 LAB — GLUCOSE, CAPILLARY
Glucose-Capillary: 123 mg/dL — ABNORMAL HIGH (ref 70–99)
Glucose-Capillary: 126 mg/dL — ABNORMAL HIGH (ref 70–99)
Glucose-Capillary: 111 mg/dL — ABNORMAL HIGH (ref 70–99)
Glucose-Capillary: 123 mg/dL — ABNORMAL HIGH (ref 70–99)
Glucose-Capillary: 91 mg/dL (ref 70–99)

## 2023-12-25 LAB — PROTIME-INR
INR: 1.2 (ref 0.8–1.2)
Prothrombin Time: 15.1 s (ref 11.4–15.2)

## 2023-12-25 LAB — APTT: aPTT: 112 s — ABNORMAL HIGH (ref 24–36)

## 2023-12-25 MED ORDER — WARFARIN SODIUM 2.5 MG PO TABS
2.5000 mg | ORAL_TABLET | Freq: Once | ORAL | Status: AC
  Administered 2023-12-25: 2.5 mg via ORAL
  Filled 2023-12-25: qty 1

## 2023-12-25 MED ORDER — MIRTAZAPINE 15 MG PO TABS
7.5000 mg | ORAL_TABLET | Freq: Every day | ORAL | Status: DC
  Administered 2023-12-25 – 2024-01-11 (×18): 7.5 mg via ORAL
  Filled 2023-12-25 (×18): qty 1

## 2023-12-25 NOTE — Progress Notes (Signed)
NAME:  Vanessa [name], MRN:  [id number], DOB:  August 26, 1951, LOS: 26 ADMISSION DATE:  11/29/2023, CONSULTATION DATE:  11/29/23 REFERRING MD:  Dr. Leafy Ro, CHIEF COMPLAINT:  postop   History of Present Illness:  72yoF with PMH significant for HTN, NICM, HFrEF (EF40-45%), PHTN, CKD and hx of prior severe MR s/p prior mitral clip x 2 in 2020.  Pt with progressive DOE found to have increasing MR  and moderate TR despite adjustments in GDMT.  Not felt to be candidate for additional mitral clip.  Repeat LHC without CAD but showed moderate PHTN.  Underwent mitral valve replacement with tissue valve and tricuspid valve repair by Dr. Leafy Ro on 12/30.  PCCM consulted to assist with vent and medical management post-operatively in ICU.   Pertinent  Medical History  Never smoker, MR w/ previous Mitral clip x 2 (2020), NICM, HFrEF, PHTN, HTN, CKD3b  Significant Hospital Events: Including procedures, antibiotic start and stop dates in addition to other pertinent events   MVR by Dr. Leafy Ro; relook for tamponade/washout later in evening without obvious source 1/2 intubated for respiratory decompensation, iNO started. Vanc, meropenem started. CRRT started.  1/5 extubated 1/8 converted to NSR overnight 1/9 back to A.fib around 0200 1/11 R introducer out.  Unable to thread right subclavian catheter 1/12 remains weak still pressor dependent 1/13 repeat [facility] sent. Still on pressors..,  Zosyn changed to meropenem.  Chest abdomen and pelvis CT obtained : Moderate hemorrhagic pericardial effusion, 5.3 cm hematoma right thrombus. Arterial gas bilateral patchy opacities mild dependent posterior right upper lobe atelectasis was unremarkable layering gallstones  1/14 new right IJ HD catheter placed.  The echocardiogram Was obtained to better evaluate her EF is 35 to 40%.  The LV demonstrates regional wall motion abnormalities there is asymmetric left ventricular hypertrophy of the inferior lateral segment RV systolic  function moderate regular reduced left atrial wall dilated no mention of clot.  1/15 still on fairly high-dose norepinephrine 1/16 cont CRRT + low dose NE  1/17 RHC confirms low output state, restarted on milrinone 1/21: dc milrinone yesterday, ongoing increase NE requirements during nights. Started droxidopa in addition to midodrine to aid off NE.  1/22: levo up to 18 overnight (max so far), cvp 9, restarting crrt today. Received dose methylene blue, Cortisol 21 1/23 Levo down to 9, CVP 8 1/24 tunneled HD and tunneled dual lumen LIJ CVC placed   Interim History / Subjective:  Remains on NE 3. Complains of feeling full, mild abdominal pain.   Objective   Blood pressure 96/62, pulse 80, temperature 98.1 F (36.7 C), temperature source Axillary, resp. rate 20, height 5\' 3"  (1.6 m), weight 79.5 kg, SpO2 94%. CVP:  [1 mmHg-9 mmHg] 7 mmHg      Intake/Output Summary (Last 24 hours) at 12/25/2023 1908 Last data filed at 12/25/2023 1800 Gross per 24 hour  Intake 2473.4 ml  Output 2755.5 ml  Net -282.1 ml   Filed Weights   12/22/23 0500 12/23/23 0434 12/25/23 0400  Weight: 79.2 kg 77.1 kg 79.5 kg   Examination:   General: chronically ill appearing woman lying in bed in NAD watching TV HEENT: [place]/AT, eyes anicteric, cortrak  Pulm: breathing comfortably on [place], reduced basilar breath sounds Cardiac: S1S2, RRR Abdomen: soft, mildly distended, NT to palpation. Extremities: mild leg edema Neuro: globally weak, answering questions.   BUN 13 Cr 0.99 on CRRT WBC 8.5 H/H 7.5/25.1 Platelets 118 Coox 72%   Resolved Hospital Problem list :   Postoperative vent management  Postop tamponade resolved after relook and washout on day 1 Assessment & Plan:   Sev mitral regurg s/p prior mitral clip, now s/p MVR Moderate tricuspid regurgitation s/p TV repair Persistent cardioplegia postoperatively- confirmed on RHC pHTN AoC HFrEF NICM pAF, flutter  S/p MVR and tricuspid ring 12/30 w/ [place].  Back to OR for tamponade. Decompensated requiring intubation for FVO, renal failure, pHTN. 1/17 RHC with low filling pressures and started on milrinone, subsequently off. Con't to have vasopressor requirements (increased at night). Coox stable at 76 -con't NE; goal SBP >85 -midodrine, droxidopa -CVC for pressors still needed -con't amiodarone -con't heparin; bridging onto warfarin -appreciate AHF's management  Sepsis unclear source - not convinced. WBC & temp stable. Thrombocytopenia stable Had pan scan 1/13 with patchy lower lobe opacities, no AP findings. No fever past 24hrs. No definite source. Lines were removed and replaced last week. Initially on linezolid/mero. WBC downtrending, no fever -complete course of meropenem today; then monitor off antibiotics  Acute renal failure on CKD III, on CRRT 1/14 CRRT started, stopped 1/20., restarted 1/22  -con't CRRT per nephro - renally dose meds, avoid nephrotoxic meds -strict I/O  Protein calorie malnutrition, severe Early satiety -con't cortrak and full TF; going to nocturnal and less kcal didn't improve her appetite -adding mirtazapine to hopefully help appetite, can stop seroquel if too sedated  Acute on chronic anemia Thrombocytopenia, potentially due to antibiotics -monitor, no current indication for transfusions.  Physical deconditioning  -PT, OT -mobility limited with CRRT  Deep tissue injury on left buttocks approximately 2 cm in diameter purple discoloration -turns, mobilize as able -optimize nutrition -unfortunately still needing pressors  Situational depression Sleep disturbance -con't melatonin, seroquel, trazodone. Adding mirtazepine. - con't zoloft - zoloft 25mg  daily   No family at bedside.   Best Practice (right click and "Reselect all SmartList Selections" daily)   Diet/type: Encourage PO, no appetite, supplement TF DVT prophylaxis systemic heparin  Pressure ulcer(s): L buttocks  GI prophylaxis:  N/A Lines: HD cath 1/24, tunneled CVC 1/24 Foley:  N/A Code Status:  full code Last date of multidisciplinary goals of care discussion [per primary]  This patient is critically ill with multiple organ system failure which requires frequent high complexity decision making, assessment, support, evaluation, and titration of therapies. This was completed through the application of advanced monitoring technologies and extensive interpretation of multiple databases. During this encounter critical care time was devoted to patient care services described in this note for 35 minutes.  Steffanie Dunn, DO 12/25/23 7:48 PM [facility]  For contact information, see Amion. If no response to pager, please call PCCM consult pager. After hours, 7PM- 7AM, please call Elink.

## 2023-12-25 NOTE — Progress Notes (Signed)
Patient ID: Vanessa [name], female   DOB: 1951/11/09, 73 y.o.   MRN: [id number]      Advanced Heart Failure Rounding Note  Cardiologist: Dr. Shirlee Latch  Chief Complaint: Cardiogenic Shock  Subjective:   12/30 S/P MVR (tissue) and TV ring and same day return to OR for tamponade/bleeding. 1/2: Progressive volume overload with poor UOP, CVVH started but ended up re-intubated.   Extubated 12/05/23 1/12: CVVH stopped 1/13: iHD, HD line removed 1/14: CRRT Restarted.  1/17: RHC mean RA 2, PA 47/20, mean PCWP 5, CI 1.74, PVR 8, PAPi 9.  Milrinone 0.125 started. 1/20 CRRT stopped.  1/22 methylene blue for persistent vasoplegia  1/24 tunneled HD cath placed  NE was down to 1 last night. Now back up to 7 + midodrine at 20 tid and droxydopa  On CVVHD keeping event to slightly negative. CVP 7  Coox 72%  Feels weak. Denies CP or SOB   Objective:   Weight Range: 79.5 kg Body mass index is 31.05 kg/m.   Vital Signs:   Temp:  [97.5 F (36.4 C)-98.8 F (37.1 C)] 97.5 F (36.4 C) (01/25 0757) Pulse Rate:  [70-110] 74 (01/25 0800) Resp:  [12-36] 23 (01/25 0800) BP: (70-149)/(43-77) 99/54 (01/25 0800) SpO2:  [92 %-99 %] 97 % (01/25 0800) Weight:  [79.5 kg] 79.5 kg (01/25 0400) Last BM Date : 12/24/23    Weight change: Filed Weights   12/22/23 0500 12/23/23 0434 12/25/23 0400  Weight: 79.2 kg 77.1 kg 79.5 kg   Intake/Output:   Intake/Output Summary (Last 24 hours) at 12/25/2023 0836 Last data filed at 12/25/2023 0800 Gross per 24 hour  Intake 2686.77 ml  Output 2177 ml  Net 509.77 ml    Physical Exam   General:  Sitting up in bed. No resp difficulty HEENT: normal Neck: supple. + RIJ HD  Cor: Regular rate & rhythm. No rubs, gallops or murmurs. Lungs: clear Abdomen: soft, nontender, nondistended. No hepatosplenomegaly. No bruits or masses. Good bowel sounds. Extremities: no cyanosis, clubbing, rash, edema Neuro: alert & orientedx3, cranial nerves grossly intact. moves all 4  extremities w/o difficulty. Affect pleasant  Telemetry   NSR 70-80, personally reviewed.  Labs    CBC Recent Labs    12/24/23 0503 12/25/23 0414  WBC 7.6 8.5  HGB 7.4* 7.5*  HCT 25.0* 25.1*  MCV 108.2* 109.6*  PLT 105* 118*   Basic Metabolic Panel Recent Labs    40/98/11 0503 12/24/23 1502 12/25/23 0414  NA 136 133* 134*  K 4.5 4.4 4.4  CL 102 98 103  CO2 27 24 26   GLUCOSE 121* 160* 143*  BUN 13 17 13   CREATININE 1.12* 1.31* 1.05*  CALCIUM 8.1* 7.9* 7.9*  MG 2.6*  --  2.5*  PHOS 2.3* 2.3* 2.2*   Liver Function Tests Recent Labs    12/24/23 1502 12/25/23 0414  ALBUMIN 2.3* 2.2*   No results for input(s): "LIPASE", "AMYLASE" in the last 72 hours. Cardiac Enzymes No results for input(s): "CKTOTAL", "CKMB", "CKMBINDEX", "TROPONINI" in the last 72 hours.  BNP: BNP (last 3 results) Recent Labs    05/26/23 1610 08/26/23 1103 09/22/23 0905  BNP 778.0* 781.0* 926.0*   Other results:  Imaging   IR US Guide Vasc Access Left Result Date: 12/24/2023 INDICATION: Poor venous access. In need of durable intravenous access for continuation of hemodialysis. Additionally, patient in need of durable intravenous access for continued medication administration and blood draws. EXAM: 1. TUNNELED DUAL-LUMEN CENTRAL VENOUS CATHETER PLACEMENT WITH ULTRASOUND  AND FLUOROSCOPIC GUIDANCE 2. TUNNELED CENTRAL VENOUS HEMODIALYSIS CATHETER PLACEMENT WITH ULTRASOUND AND FLUOROSCOPIC GUIDANCE MEDICATIONS: Ancef 2 gm IV. The antibiotic was given in an appropriate time interval prior to skin puncture. ANESTHESIA/SEDATION: Moderate (conscious) sedation was employed during this procedure. A total of Versed 1.5 mg was administered intravenously. Moderate Sedation Time: 30 minutes. The patient's level of consciousness and vital signs were monitored continuously by radiology nursing throughout the procedure under my direct supervision. FLUOROSCOPY TIME:  3 minutes, 18 seconds (34 mGy) COMPLICATIONS:  None immediate. PROCEDURE: Informed written consent was obtained from the patient after a discussion of the risks, benefits, and alternatives to treatment. Questions regarding the procedure were encouraged and answered. Attention was first paid towards placement of a tunneled central venous catheter. Left neck and chest were prepped and draped in usual sterile fashion. Under direct ultrasound guidance, the left internal jugular vein was accessed with a micropuncture kit after the overlying soft tissues were anesthetized with 1% lidocaine with epinephrine. This allowed for placement of a peel-away sheath. The microwire was utilized for measurement purposes and a 27 cm dual lumen cuffed central venous catheter was tunneled from the left anterior chest to the venotomy site and inserted through the peel-away sheath with tip ultimately terminating within the superior cavoatrial junction. Postprocedural spot fluoroscopic image was obtained. The catheter was secured at the exit site within interrupted 0 Prolene suture and the venotomy site was closed with Dermabond and Steri-Strips. Attention was now paid towards placement of a tunneled hemodialysis catheter. The right neck and chest were prepped with chlorhexidine in a sterile fashion, and a sterile drape was applied covering the operative field. Maximum barrier sterile technique with sterile gowns and gloves were used for the procedure. A timeout was performed prior to the initiation of the procedure. After creating a small venotomy incision, a micropuncture kit was utilized to access the internal jugular vein. Real-time ultrasound guidance was utilized for vascular access including the acquisition of a permanent ultrasound image documenting patency of the accessed vessel. The microwire was utilized to measure appropriate catheter length. A stiff Glidewire was advanced to the level of the IVC and the micropuncture sheath was exchanged for a peel-away sheath. A palindrome  tunneled hemodialysis catheter measuring 19 cm from tip to cuff was tunneled in a retrograde fashion from the anterior chest wall to the venotomy incision. The catheter was then placed through the peel-away sheath with tips ultimately positioned within the superior aspect of the right atrium. Final catheter positioning was confirmed and documented with a spot radiographic image. The catheter aspirates and flushes normally. The catheter was flushed with appropriate volume heparin dwells. The catheter exit site was secured with a 0-Prolene retention suture. The venotomy incision was closed with Dermabond and Steri-strips. Dressings were applied. The patient tolerated the procedure well without immediate post procedural complication. IMPRESSION: 1. Successful placement a 27 cm tunneled dual-lumen central venous catheter via the left internal jugular vein with tip terminating within the superior cavoatrial junction. The catheter is ready for immediate usage. 2. Successful placement of 19 cm tip to cuff tunneled hemodialysis catheter via the right internal jugular vein with tips terminating within the superior aspect of the right atrium. The catheter is ready for immediate use. Electronically Signed   By: Simonne Come M.D.   On: 12/24/2023 17:07   IR Fluoro Guide CV Line Left Result Date: 12/24/2023 INDICATION: Poor venous access. In need of durable intravenous access for continuation of hemodialysis. Additionally, patient in need of  durable intravenous access for continued medication administration and blood draws. EXAM: 1. TUNNELED DUAL-LUMEN CENTRAL VENOUS CATHETER PLACEMENT WITH ULTRASOUND AND FLUOROSCOPIC GUIDANCE 2. TUNNELED CENTRAL VENOUS HEMODIALYSIS CATHETER PLACEMENT WITH ULTRASOUND AND FLUOROSCOPIC GUIDANCE MEDICATIONS: Ancef 2 gm IV. The antibiotic was given in an appropriate time interval prior to skin puncture. ANESTHESIA/SEDATION: Moderate (conscious) sedation was employed during this procedure. A total  of Versed 1.5 mg was administered intravenously. Moderate Sedation Time: 30 minutes. The patient's level of consciousness and vital signs were monitored continuously by radiology nursing throughout the procedure under my direct supervision. FLUOROSCOPY TIME:  3 minutes, 18 seconds (34 mGy) COMPLICATIONS: None immediate. PROCEDURE: Informed written consent was obtained from the patient after a discussion of the risks, benefits, and alternatives to treatment. Questions regarding the procedure were encouraged and answered. Attention was first paid towards placement of a tunneled central venous catheter. Left neck and chest were prepped and draped in usual sterile fashion. Under direct ultrasound guidance, the left internal jugular vein was accessed with a micropuncture kit after the overlying soft tissues were anesthetized with 1% lidocaine with epinephrine. This allowed for placement of a peel-away sheath. The microwire was utilized for measurement purposes and a 27 cm dual lumen cuffed central venous catheter was tunneled from the left anterior chest to the venotomy site and inserted through the peel-away sheath with tip ultimately terminating within the superior cavoatrial junction. Postprocedural spot fluoroscopic image was obtained. The catheter was secured at the exit site within interrupted 0 Prolene suture and the venotomy site was closed with Dermabond and Steri-Strips. Attention was now paid towards placement of a tunneled hemodialysis catheter. The right neck and chest were prepped with chlorhexidine in a sterile fashion, and a sterile drape was applied covering the operative field. Maximum barrier sterile technique with sterile gowns and gloves were used for the procedure. A timeout was performed prior to the initiation of the procedure. After creating a small venotomy incision, a micropuncture kit was utilized to access the internal jugular vein. Real-time ultrasound guidance was utilized for vascular  access including the acquisition of a permanent ultrasound image documenting patency of the accessed vessel. The microwire was utilized to measure appropriate catheter length. A stiff Glidewire was advanced to the level of the IVC and the micropuncture sheath was exchanged for a peel-away sheath. A palindrome tunneled hemodialysis catheter measuring 19 cm from tip to cuff was tunneled in a retrograde fashion from the anterior chest wall to the venotomy incision. The catheter was then placed through the peel-away sheath with tips ultimately positioned within the superior aspect of the right atrium. Final catheter positioning was confirmed and documented with a spot radiographic image. The catheter aspirates and flushes normally. The catheter was flushed with appropriate volume heparin dwells. The catheter exit site was secured with a 0-Prolene retention suture. The venotomy incision was closed with Dermabond and Steri-strips. Dressings were applied. The patient tolerated the procedure well without immediate post procedural complication. IMPRESSION: 1. Successful placement a 27 cm tunneled dual-lumen central venous catheter via the left internal jugular vein with tip terminating within the superior cavoatrial junction. The catheter is ready for immediate usage. 2. Successful placement of 19 cm tip to cuff tunneled hemodialysis catheter via the right internal jugular vein with tips terminating within the superior aspect of the right atrium. The catheter is ready for immediate use. Electronically Signed   By: Simonne Come M.D.   On: 12/24/2023 17:07   IR US Guide Vasc Access Right Result Date:  12/24/2023 INDICATION: Poor venous access. In need of durable intravenous access for continuation of hemodialysis. Additionally, patient in need of durable intravenous access for continued medication administration and blood draws. EXAM: 1. TUNNELED DUAL-LUMEN CENTRAL VENOUS CATHETER PLACEMENT WITH ULTRASOUND AND FLUOROSCOPIC  GUIDANCE 2. TUNNELED CENTRAL VENOUS HEMODIALYSIS CATHETER PLACEMENT WITH ULTRASOUND AND FLUOROSCOPIC GUIDANCE MEDICATIONS: Ancef 2 gm IV. The antibiotic was given in an appropriate time interval prior to skin puncture. ANESTHESIA/SEDATION: Moderate (conscious) sedation was employed during this procedure. A total of Versed 1.5 mg was administered intravenously. Moderate Sedation Time: 30 minutes. The patient's level of consciousness and vital signs were monitored continuously by radiology nursing throughout the procedure under my direct supervision. FLUOROSCOPY TIME:  3 minutes, 18 seconds (34 mGy) COMPLICATIONS: None immediate. PROCEDURE: Informed written consent was obtained from the patient after a discussion of the risks, benefits, and alternatives to treatment. Questions regarding the procedure were encouraged and answered. Attention was first paid towards placement of a tunneled central venous catheter. Left neck and chest were prepped and draped in usual sterile fashion. Under direct ultrasound guidance, the left internal jugular vein was accessed with a micropuncture kit after the overlying soft tissues were anesthetized with 1% lidocaine with epinephrine. This allowed for placement of a peel-away sheath. The microwire was utilized for measurement purposes and a 27 cm dual lumen cuffed central venous catheter was tunneled from the left anterior chest to the venotomy site and inserted through the peel-away sheath with tip ultimately terminating within the superior cavoatrial junction. Postprocedural spot fluoroscopic image was obtained. The catheter was secured at the exit site within interrupted 0 Prolene suture and the venotomy site was closed with Dermabond and Steri-Strips. Attention was now paid towards placement of a tunneled hemodialysis catheter. The right neck and chest were prepped with chlorhexidine in a sterile fashion, and a sterile drape was applied covering the operative field. Maximum barrier  sterile technique with sterile gowns and gloves were used for the procedure. A timeout was performed prior to the initiation of the procedure. After creating a small venotomy incision, a micropuncture kit was utilized to access the internal jugular vein. Real-time ultrasound guidance was utilized for vascular access including the acquisition of a permanent ultrasound image documenting patency of the accessed vessel. The microwire was utilized to measure appropriate catheter length. A stiff Glidewire was advanced to the level of the IVC and the micropuncture sheath was exchanged for a peel-away sheath. A palindrome tunneled hemodialysis catheter measuring 19 cm from tip to cuff was tunneled in a retrograde fashion from the anterior chest wall to the venotomy incision. The catheter was then placed through the peel-away sheath with tips ultimately positioned within the superior aspect of the right atrium. Final catheter positioning was confirmed and documented with a spot radiographic image. The catheter aspirates and flushes normally. The catheter was flushed with appropriate volume heparin dwells. The catheter exit site was secured with a 0-Prolene retention suture. The venotomy incision was closed with Dermabond and Steri-strips. Dressings were applied. The patient tolerated the procedure well without immediate post procedural complication. IMPRESSION: 1. Successful placement a 27 cm tunneled dual-lumen central venous catheter via the left internal jugular vein with tip terminating within the superior cavoatrial junction. The catheter is ready for immediate usage. 2. Successful placement of 19 cm tip to cuff tunneled hemodialysis catheter via the right internal jugular vein with tips terminating within the superior aspect of the right atrium. The catheter is ready for immediate use. Electronically Signed  By: Simonne Come M.D.   On: 12/24/2023 17:07   IR Fluoro Guide CV Line Right Result Date:  12/24/2023 INDICATION: Poor venous access. In need of durable intravenous access for continuation of hemodialysis. Additionally, patient in need of durable intravenous access for continued medication administration and blood draws. EXAM: 1. TUNNELED DUAL-LUMEN CENTRAL VENOUS CATHETER PLACEMENT WITH ULTRASOUND AND FLUOROSCOPIC GUIDANCE 2. TUNNELED CENTRAL VENOUS HEMODIALYSIS CATHETER PLACEMENT WITH ULTRASOUND AND FLUOROSCOPIC GUIDANCE MEDICATIONS: Ancef 2 gm IV. The antibiotic was given in an appropriate time interval prior to skin puncture. ANESTHESIA/SEDATION: Moderate (conscious) sedation was employed during this procedure. A total of Versed 1.5 mg was administered intravenously. Moderate Sedation Time: 30 minutes. The patient's level of consciousness and vital signs were monitored continuously by radiology nursing throughout the procedure under my direct supervision. FLUOROSCOPY TIME:  3 minutes, 18 seconds (34 mGy) COMPLICATIONS: None immediate. PROCEDURE: Informed written consent was obtained from the patient after a discussion of the risks, benefits, and alternatives to treatment. Questions regarding the procedure were encouraged and answered. Attention was first paid towards placement of a tunneled central venous catheter. Left neck and chest were prepped and draped in usual sterile fashion. Under direct ultrasound guidance, the left internal jugular vein was accessed with a micropuncture kit after the overlying soft tissues were anesthetized with 1% lidocaine with epinephrine. This allowed for placement of a peel-away sheath. The microwire was utilized for measurement purposes and a 27 cm dual lumen cuffed central venous catheter was tunneled from the left anterior chest to the venotomy site and inserted through the peel-away sheath with tip ultimately terminating within the superior cavoatrial junction. Postprocedural spot fluoroscopic image was obtained. The catheter was secured at the exit site within  interrupted 0 Prolene suture and the venotomy site was closed with Dermabond and Steri-Strips. Attention was now paid towards placement of a tunneled hemodialysis catheter. The right neck and chest were prepped with chlorhexidine in a sterile fashion, and a sterile drape was applied covering the operative field. Maximum barrier sterile technique with sterile gowns and gloves were used for the procedure. A timeout was performed prior to the initiation of the procedure. After creating a small venotomy incision, a micropuncture kit was utilized to access the internal jugular vein. Real-time ultrasound guidance was utilized for vascular access including the acquisition of a permanent ultrasound image documenting patency of the accessed vessel. The microwire was utilized to measure appropriate catheter length. A stiff Glidewire was advanced to the level of the IVC and the micropuncture sheath was exchanged for a peel-away sheath. A palindrome tunneled hemodialysis catheter measuring 19 cm from tip to cuff was tunneled in a retrograde fashion from the anterior chest wall to the venotomy incision. The catheter was then placed through the peel-away sheath with tips ultimately positioned within the superior aspect of the right atrium. Final catheter positioning was confirmed and documented with a spot radiographic image. The catheter aspirates and flushes normally. The catheter was flushed with appropriate volume heparin dwells. The catheter exit site was secured with a 0-Prolene retention suture. The venotomy incision was closed with Dermabond and Steri-strips. Dressings were applied. The patient tolerated the procedure well without immediate post procedural complication. IMPRESSION: 1. Successful placement a 27 cm tunneled dual-lumen central venous catheter via the left internal jugular vein with tip terminating within the superior cavoatrial junction. The catheter is ready for immediate usage. 2. Successful placement of  19 cm tip to cuff tunneled hemodialysis catheter via the right internal jugular vein with  tips terminating within the superior aspect of the right atrium. The catheter is ready for immediate use. Electronically Signed   By: Simonne Come M.D.   On: 12/24/2023 17:07   DG CHEST PORT 1 VIEW Result Date: 12/24/2023 CLINICAL DATA:  Central line placement. EXAM: PORTABLE CHEST 1 VIEW COMPARISON:  Chest x-ray dated December 20, 2023. FINDINGS: Interval exchange of the right internal jugular central venous catheter for a new tunneled right internal jugular dialysis catheter with the tip in the proximal right atrium. New tunneled left internal jugular central venous catheter with tip in the proximal right atrium as well. Unchanged feeding tube tip in the distal stomach. Stable cardiomegaly status post mitral and tricuspid valve replacements. Similar mild central pulmonary vascular congestion. Dense left lower lobe opacity, likely atelectasis, and probable small left pleural effusion are unchanged. No pneumothorax. No acute osseous abnormality. IMPRESSION: 1. New tunneled bilateral internal jugular catheters with tips in the proximal right atrium. No pneumothorax. 2. Unchanged left basilar atelectasis and probable small left pleural effusion. Electronically Signed   By: Obie Dredge M.D.   On: 12/24/2023 14:49    Medications:   Scheduled Medications:  amiodarone  200 mg Oral BID   ascorbic acid  250 mg Per Tube BID   Chlorhexidine Gluconate Cloth  6 each Topical Daily   darbepoetin (ARANESP) injection - NON-DIALYSIS  40 mcg Subcutaneous Q Tue-1800   droxidopa  200 mg Per Tube TID WC   feeding supplement (PROSource TF20)  60 mL Per Tube BID   fiber supplement (BANATROL TF)  60 mL Per Tube QID   Gerhardt's butt cream   Topical BID   influenza vaccine adjuvanted  0.5 mL Intramuscular Tomorrow-1000   insulin aspart  0-24 Units Subcutaneous Q4H   midodrine  20 mg Per Tube Q8H   multivitamin  1 tablet Per Tube  BID   QUEtiapine  25 mg Per Tube QHS   sertraline  25 mg Per Tube Daily   Warfarin - Pharmacist Dosing Inpatient   Does not apply q1600   zinc sulfate (50mg  elemental zinc)  220 mg Per Tube Daily    Infusions:   prismasol BGK 4/2.5 400 mL/hr at 12/25/23 0000    prismasol BGK 4/2.5 400 mL/hr at 12/25/23 0000   sodium chloride 5 mL/hr at 12/25/23 0800   anticoagulant sodium citrate     feeding supplement (VITAL 1.5 CAL) 40 mL/hr at 12/25/23 0800   heparin 1,050 Units/hr (12/25/23 0800)   meropenem (MERREM) IV Stopped (12/24/23 2311)   norepinephrine (LEVOPHED) Adult infusion 7 mcg/min (12/25/23 0800)   prismasol BGK 4/2.5 1,500 mL/hr at 12/25/23 0752    PRN Medications: sodium chloride, alteplase, anticoagulant sodium citrate, artificial tears, bisacodyl **OR** bisacodyl, heparin, heparin, HYDROmorphone (DILAUDID) injection, levalbuterol, lidocaine (PF), lidocaine-prilocaine, lip balm, melatonin, ondansetron (ZOFRAN) IV, mouth rinse, oxyCODONE, pentafluoroprop-tetrafluoroeth, pneumococcal 20-valent conjugate vaccine, sodium chloride, traZODone  Patient Profile   SYLIVA MEE is a 73 y.o. with history of nonischemic cardiomyopathy and severe MR s/p Mitral Clip x2 (2020), pHTN, and CKD IIIb.   Assessment/Plan   1. Post Cardiotomy Cardiogenic & septic shock: Bioprosthetic MVR and tricuspid ring 11/29/23 with Dr. Leafy Ro. Pre-op cath with no significant CAD.  Pre-op echo with EF 40-45%, severe MR. Went back to the OR with post-op tamponade for clean-out.  Echo 12/31 showed EF 25-30%, moderate RV dysfunction, s/p bioprosthetic mitral valve replacement with trivial MR and tricuspid valve repair with trivial TR. Worsened on 1/2 and re-intubated in setting of  volume overload, oliguric renal failure &  pulmonary hypertension. Extubated on 12/04/22. 1/13 CT C/A/P  moderate hemorrhagic pericardial effusion and 5.3 cm hematoma right heart border.  Repeat echo no tamponade. Required CRRT for volume  removal.  CVVH stopped 1/12 then restarted 01/14 for volume removal.  - Post -op echo 12/14/23 EF 35-40% RV mod reduced. Valves stable - RHC 1/17 showed RA 3 PA 47/20 (30) PCWP 5 PAPi 9 CI 1.74.  - c/w vasopalegia. Continues with vasoplegia.On NE 7, high dose midodrine + droxidopa. Methylene blue given 1/22.  - Continue to wean NE as tolerated. Keep BP goal to SBP 90.  - encouraged TED hoses for compression. May need to add abdominal binder  - Remains anuric. CVP 7-8. On CRRT, keep even to slightly negative for now  2. Mitral regurgitation: s/p Mitraclip x 2 1/20. Echo 9/24 showed MV s/p Mitraclip x 2 with mean gradient 6 mmHg and severe mitral regurgitation.  She was not thought to be a candidate for an additional Mitraclip due to lack of room between the two existing clips. S/p MV replacement and Tricuspid repair on 11/29/23 with Dr. Leafy Ro.  - Last echo stable valves.  3. Ventricular ectopy:  - Now quiescent -  Continue amiodarone 200 mg bid  4. Pulmonary hypertension: Pre-op RHC (11/24) with PVR 4.5, with moderate mixed pulmonary arterial and pulmonary venous hypertension likely due to mitral valve regurgitation. Now s/p MVR, PA pressure severely elevated post-op.  Suspect still mixed PAH/PVH with elevated PCWP.  NO was tried at 10 ppm but did not have much effect so now off.  Will hold off on pulmonary vasodilators; small clinical trials demonstrate no improvement and some markers of harm with PDE5i in PH in the setting of mitral valve disease. RHC 1/17 with mild pulmonary arterial hypertension.  5. 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 TSH was normal in 8/24. Follows with Endocrinology 6. AKI on CKD stage 3:  1/14 CRRT restarted. CRRT stopped 1/20 and restarted 1/22. Remains anuric.  - b/l Scr 1.6-1.7 - CVP 7-8. Keep even for now on CRRT - plan tunneled HD cath today  - Nephrology following.   7. Blood loss anemia - Hgb 7.4, monitor closely  8. Complete heart block: She  had post-op CHB. Resolved.  9. Hypoxic / Hypercapnic respiratory failure: Reintubated 01/02.  Extubated 01/05.  -Now on room air with stable sats.  10. Paroxsymal Atrial fibrillation:  - In SR on po amio.   - On Heparin drip. Pharmacy dosing warfarin.  11. ID: Suspected septic shock component, no definite source. All lines changed out last week and started on linezolid/meropenem, now just on meropenem.  Afebrile. Leukocytosis resolved  - Continue meropenem. Stop date 1/25 (today) -12. Thrombocytopenia: Suspect due to sepsis. Platelets trending back up 118K today.  13. Deconditioning - Continue PT.  14. Protein-calorie malnutrition - continue TFs  Continues to struggle with refractory hypotension/vasoplegia requiring NE. As it looks like there will be no renal recovery, can drop SBP goal to 85 as long as other end-organ function stable and she is not symptomatic. I worry she will not be able to tolerate iHD. Will need to introduce Palliative discussions.   CRITICAL CARE Performed by: Arvilla Meres  Total critical care time: 40 minutes  Critical care time was exclusive of separately billable procedures and treating other patients.  Critical care was necessary to treat or prevent imminent or life-threatening deterioration.  Critical care was time spent personally by me (independent of  midlevel providers or residents) on the following activities: development of treatment plan with patient and/or surrogate as well as nursing, discussions with consultants, evaluation of patient's response to treatment, examination of patient, obtaining history from patient or surrogate, ordering and performing treatments and interventions, ordering and review of laboratory studies, ordering and review of radiographic studies, pulse oximetry and re-evaluation of patient's condition.     Arvilla Meres, MD  8:39 AM

## 2023-12-25 NOTE — Progress Notes (Signed)
PHARMACY - ANTICOAGULATION CONSULT NOTE  Pharmacy Consult for IV heparin + warfarin Indication: atrial fibrillation  No Known Allergies  Patient Measurements: Height: 5\' 3"  (160 cm) Weight: 79.5 kg (175 lb 4.3 oz) IBW/kg (Calculated) : 52.4 Heparin Dosing Weight: ~ 70 kg  Vital Signs: Temp: 97.5 F (36.4 C) (01/25 0757) Temp Source: Oral (01/25 0757) BP: 99/54 (01/25 0800) Pulse Rate: 74 (01/25 0800)  Labs: Recent Labs    12/23/23 1623 12/24/23 0503 12/24/23 1502 12/24/23 1809 12/25/23 0414  HGB 7.4* 7.4*  --   --  7.5*  HCT 24.8* 25.0*  --   --  25.1*  PLT 104* 105*  --   --  118*  APTT  --  124*  --   --  112*  LABPROT  --   --  14.9  --   --   INR  --   --  1.2  --   --   HEPARINUNFRC  --  0.42  --  0.36 0.49  CREATININE 1.47* 1.12* 1.31*  --  1.05*    Estimated Creatinine Clearance: 48.3 mL/min (A) (by C-G formula based on SCr of 1.05 mg/dL (H)).  Warfarin doses received (INR): 1/15 - 2.5 (1.1) 1/16 - 2.5 (1.1) 1/17 - 2.5 (1.2) 1/18 - 2.5 (1.2) 1/19 - 5 (1.3) 1/20 - 5 (1.3) 1/21 >> 1/24 - held for tunneled access   Assessment: 73 yo female s/p scheduled bioprosthetic mitral valve replacement with tissue valve 12/30 now with afib, pharmacy asked to dose anticoagulation with IV heparin. Spontaneously converted 1/7; flipped back to AF overnight 1/9 0300. She is noted on CRRT. Baseline INR 1.1, slow uptrend to 1.3 s/p 6 days warfarin therapy, total dose 20 mg, then held 1/21-24 for tunneled access plans. Patient now s/p [facility] and tunneled central line placement 1/24 AM.   HL therapeutic this morning at 0.49 on 1050 units/hr since 1/22. CBC remains low-stable, Still requiring norepinephrine, midodrine, and droxidopa (added 1/21) for hemodynamic tolerance of dialysis. Per NP with [facility] okay to resume anticoagulation after tunneled cath 1/24.  Today's INR pending.   Goal of Therapy:  Heparin level 0.3-0.5 units/ml INR goal 2 - 3 (bMVR) Monitor platelets by  anticoagulation protocol: Yes   Plan:  Continue IV heparin at 1050 units/hr Warfarin 2.5 mg again tonight. Daily heparin level, CBC and INR.  Reece Leader, Colon Flattery, BCCP Clinical Pharmacist  12/25/2023 8:48 AM   [facility] pharmacy phone numbers are listed on [email or web address]

## 2023-12-25 NOTE — Progress Notes (Signed)
[facility] NEPHROLOGY PROGRESS NOTE  Assessment/ Plan: Pt is a 73 y.o. yo female  with a past medical history HTN, NICM, CHF, pulmonary hypertension, mitral and tricuspid valve disease who present [name]/ surgery for valvular disease c/b shock, AHRF, and AKI   1)  Anuric AKI on CKD 3A: Multifactorial etiology including cardiorenal syndrome and ischemic ATN due to shock.  Started CRRT on 1/2 for fluid overload.  The CRRT was held on 1/12 because of worsening leukocytosis and poor access with plan to have line holiday. IHD on 1/13 with IDH and no UF.   Resumed CRRT on 1/14-1/20 (new cath on 1/14) to manage volume. CRRT 1/22-   No UOP -> BladderScan on 1/22 0mL  Seen on CRRT, 4K bath  No further issues with high venous pressures.  CVP 5-7 and running even.  Co-ox 72%.  2) #Multifactorial shock: Vasoplegic and cardioplegia catheter surgery.   - midodrine incr to 20mg  TID, milrinone off on 1/20, droxidopa trial and norepi currently at 9. Cont mgmt per primary team   3) Acute exacerbation of heart failure with reduced ejection fraction: Volume overload. Optimized with CRRT   4) Severe MR and tricuspid valvular disease:  - Status post repair.  CT surgery managing   5) Postoperative cardiac tamponade:  - Continue monitoring per cardiology   6)  Hyponatremia: improved with CRRT    7) Anemia: Related to blood loss and compounded by AKI and critical illness.    - Transfusions per primary team.   - aranesp 40 mcg weekly on Tuesdays   8)  Thrombocytopenia: switched from heparin to coumadin.    Subjective: Off CRRT on 1/20 (running even), off  milrinone on 1/20. No complaints, in better spirits today.  Breathing comfortably with a CVP between 5 and 7.  Co-ox 72%   Objective Vital signs in last 24 hours: Vitals:   12/25/23 0715 12/25/23 0730 12/25/23 0745 12/25/23 0757  BP: (!) 104/55 (!) 98/57 (!) 100/55   Pulse: 77 75 73   Resp: (!) 29 (!) 23 (!) 27   Temp:    (!) 97.5 F  (36.4 C)  TempSrc:    Oral  SpO2: 95% 96% 98%   Weight:      Height:       Weight change:   Intake/Output Summary (Last 24 hours) at 12/25/2023 0801 Last data filed at 12/25/2023 0700 Gross per 24 hour  Intake 2589.71 ml  Output 2037 ml  Net 552.71 ml       Labs: RENAL PANEL Recent Labs  Lab 12/21/23 0457 12/21/23 1700 12/22/23 0344 12/22/23 1701 12/23/23 0408 12/23/23 1623 12/24/23 0503 12/24/23 1502 12/25/23 0414  NA 131*   < > 131*   < > 130* 133* 136 133* 134*  K 4.7   < > 5.1   < > 4.4 4.5 4.5 4.4 4.4  CL 97*   < > 94*   < > 96* 99 102 98 103  CO2 24   < > 23   < > 26 26 27 24 26   GLUCOSE 142*   < > 132*   < > 150* 125* 121* 160* 143*  BUN 34*   < > 51*   < > 21 19 13 17 13   CREATININE 2.77*   < > 4.18*   < > 1.71* 1.47* 1.12* 1.31* 1.05*  CALCIUM 8.6*   < > 9.1   < > 7.9* 8.1* 8.1* 7.9* 7.9*  MG 2.6*  --  2.7*  --  2.5*  --  2.6*  --  2.5*  PHOS 5.0*   < > 7.9*   < > 3.0 2.5 2.3* 2.3* 2.2*  ALBUMIN 2.3*   < > 2.4*   < > 2.3* 2.3* 2.2* 2.3* 2.2*   < > = values in this interval not displayed.    Liver Function Tests: Recent Labs  Lab 12/24/23 0503 12/24/23 1502 12/25/23 0414  ALBUMIN 2.2* 2.3* 2.2*   No results for input(s): "LIPASE", "AMYLASE" in the last 168 hours. No results for input(s): "AMMONIA" in the last 168 hours.  CBC: Recent Labs    12/22/23 0344 12/23/23 0408 12/23/23 1623 12/24/23 0503 12/25/23 0414  HGB 8.3* 7.8* 7.4* 7.4* 7.5*  MCV 104.7* 107.2* 108.3* 108.2* 109.6*    Cardiac Enzymes: No results for input(s): "CKTOTAL", "CKMB", "CKMBINDEX", "TROPONINI" in the last 168 hours. CBG: Recent Labs  Lab 12/24/23 1612 12/24/23 1936 12/24/23 2344 12/25/23 0410 12/25/23 0759  GLUCAP 123* 102* 127* 123* 126*    Iron Studies: No results for input(s): "IRON", "TIBC", "TRANSFERRIN", "FERRITIN" in the last 72 hours. Studies/Results: IR US Guide Vasc Access Left Result Date: 12/24/2023 INDICATION: Poor venous access. In need of  durable intravenous access for continuation of hemodialysis. Additionally, patient in need of durable intravenous access for continued medication administration and blood draws. EXAM: 1. TUNNELED DUAL-LUMEN CENTRAL VENOUS CATHETER PLACEMENT WITH ULTRASOUND AND FLUOROSCOPIC GUIDANCE 2. TUNNELED CENTRAL VENOUS HEMODIALYSIS CATHETER PLACEMENT WITH ULTRASOUND AND FLUOROSCOPIC GUIDANCE MEDICATIONS: Ancef 2 gm IV. The antibiotic was given in an appropriate time interval prior to skin puncture. ANESTHESIA/SEDATION: Moderate (conscious) sedation was employed during this procedure. A total of Versed 1.5 mg was administered intravenously. Moderate Sedation Time: 30 minutes. The patient's level of consciousness and vital signs were monitored continuously by radiology nursing throughout the procedure under my direct supervision. FLUOROSCOPY TIME:  3 minutes, 18 seconds (34 mGy) COMPLICATIONS: None immediate. PROCEDURE: Informed written consent was obtained from the patient after a discussion of the risks, benefits, and alternatives to treatment. Questions regarding the procedure were encouraged and answered. Attention was first paid towards placement of a tunneled central venous catheter. Left neck and chest were prepped and draped in usual sterile fashion. Under direct ultrasound guidance, the left internal jugular vein was accessed with a micropuncture kit after the overlying soft tissues were anesthetized with 1% lidocaine with epinephrine. This allowed for placement of a peel-away sheath. The microwire was utilized for measurement purposes and a 27 cm dual lumen cuffed central venous catheter was tunneled from the left anterior chest to the venotomy site and inserted through the peel-away sheath with tip ultimately terminating within the superior cavoatrial junction. Postprocedural spot fluoroscopic image was obtained. The catheter was secured at the exit site within interrupted 0 Prolene suture and the venotomy site was  closed with Dermabond and Steri-Strips. Attention was now paid towards placement of a tunneled hemodialysis catheter. The right neck and chest were prepped with chlorhexidine in a sterile fashion, and a sterile drape was applied covering the operative field. Maximum barrier sterile technique with sterile gowns and gloves were used for the procedure. A timeout was performed prior to the initiation of the procedure. After creating a small venotomy incision, a micropuncture kit was utilized to access the internal jugular vein. Real-time ultrasound guidance was utilized for vascular access including the acquisition of a permanent ultrasound image documenting patency of the accessed vessel. The microwire was utilized to measure appropriate catheter length. A stiff Glidewire  was advanced to the level of the IVC and the micropuncture sheath was exchanged for a peel-away sheath. A palindrome tunneled hemodialysis catheter measuring 19 cm from tip to cuff was tunneled in a retrograde fashion from the anterior chest wall to the venotomy incision. The catheter was then placed through the peel-away sheath with tips ultimately positioned within the superior aspect of the right atrium. Final catheter positioning was confirmed and documented with a spot radiographic image. The catheter aspirates and flushes normally. The catheter was flushed with appropriate volume heparin dwells. The catheter exit site was secured with a 0-Prolene retention suture. The venotomy incision was closed with Dermabond and Steri-strips. Dressings were applied. The patient tolerated the procedure well without immediate post procedural complication. IMPRESSION: 1. Successful placement a 27 cm tunneled dual-lumen central venous catheter via the left internal jugular vein with tip terminating within the superior cavoatrial junction. The catheter is ready for immediate usage. 2. Successful placement of 19 cm tip to cuff tunneled hemodialysis catheter via the  right internal jugular vein with tips terminating within the superior aspect of the right atrium. The catheter is ready for immediate use. Electronically Signed   By: Simonne Come M.D.   On: 12/24/2023 17:07   IR Fluoro Guide CV Line Left Result Date: 12/24/2023 INDICATION: Poor venous access. In need of durable intravenous access for continuation of hemodialysis. Additionally, patient in need of durable intravenous access for continued medication administration and blood draws. EXAM: 1. TUNNELED DUAL-LUMEN CENTRAL VENOUS CATHETER PLACEMENT WITH ULTRASOUND AND FLUOROSCOPIC GUIDANCE 2. TUNNELED CENTRAL VENOUS HEMODIALYSIS CATHETER PLACEMENT WITH ULTRASOUND AND FLUOROSCOPIC GUIDANCE MEDICATIONS: Ancef 2 gm IV. The antibiotic was given in an appropriate time interval prior to skin puncture. ANESTHESIA/SEDATION: Moderate (conscious) sedation was employed during this procedure. A total of Versed 1.5 mg was administered intravenously. Moderate Sedation Time: 30 minutes. The patient's level of consciousness and vital signs were monitored continuously by radiology nursing throughout the procedure under my direct supervision. FLUOROSCOPY TIME:  3 minutes, 18 seconds (34 mGy) COMPLICATIONS: None immediate. PROCEDURE: Informed written consent was obtained from the patient after a discussion of the risks, benefits, and alternatives to treatment. Questions regarding the procedure were encouraged and answered. Attention was first paid towards placement of a tunneled central venous catheter. Left neck and chest were prepped and draped in usual sterile fashion. Under direct ultrasound guidance, the left internal jugular vein was accessed with a micropuncture kit after the overlying soft tissues were anesthetized with 1% lidocaine with epinephrine. This allowed for placement of a peel-away sheath. The microwire was utilized for measurement purposes and a 27 cm dual lumen cuffed central venous catheter was tunneled from the left  anterior chest to the venotomy site and inserted through the peel-away sheath with tip ultimately terminating within the superior cavoatrial junction. Postprocedural spot fluoroscopic image was obtained. The catheter was secured at the exit site within interrupted 0 Prolene suture and the venotomy site was closed with Dermabond and Steri-Strips. Attention was now paid towards placement of a tunneled hemodialysis catheter. The right neck and chest were prepped with chlorhexidine in a sterile fashion, and a sterile drape was applied covering the operative field. Maximum barrier sterile technique with sterile gowns and gloves were used for the procedure. A timeout was performed prior to the initiation of the procedure. After creating a small venotomy incision, a micropuncture kit was utilized to access the internal jugular vein. Real-time ultrasound guidance was utilized for vascular access including the acquisition of a permanent  ultrasound image documenting patency of the accessed vessel. The microwire was utilized to measure appropriate catheter length. A stiff Glidewire was advanced to the level of the IVC and the micropuncture sheath was exchanged for a peel-away sheath. A palindrome tunneled hemodialysis catheter measuring 19 cm from tip to cuff was tunneled in a retrograde fashion from the anterior chest wall to the venotomy incision. The catheter was then placed through the peel-away sheath with tips ultimately positioned within the superior aspect of the right atrium. Final catheter positioning was confirmed and documented with a spot radiographic image. The catheter aspirates and flushes normally. The catheter was flushed with appropriate volume heparin dwells. The catheter exit site was secured with a 0-Prolene retention suture. The venotomy incision was closed with Dermabond and Steri-strips. Dressings were applied. The patient tolerated the procedure well without immediate post procedural complication.  IMPRESSION: 1. Successful placement a 27 cm tunneled dual-lumen central venous catheter via the left internal jugular vein with tip terminating within the superior cavoatrial junction. The catheter is ready for immediate usage. 2. Successful placement of 19 cm tip to cuff tunneled hemodialysis catheter via the right internal jugular vein with tips terminating within the superior aspect of the right atrium. The catheter is ready for immediate use. Electronically Signed   By: Simonne Come M.D.   On: 12/24/2023 17:07   IR US Guide Vasc Access Right Result Date: 12/24/2023 INDICATION: Poor venous access. In need of durable intravenous access for continuation of hemodialysis. Additionally, patient in need of durable intravenous access for continued medication administration and blood draws. EXAM: 1. TUNNELED DUAL-LUMEN CENTRAL VENOUS CATHETER PLACEMENT WITH ULTRASOUND AND FLUOROSCOPIC GUIDANCE 2. TUNNELED CENTRAL VENOUS HEMODIALYSIS CATHETER PLACEMENT WITH ULTRASOUND AND FLUOROSCOPIC GUIDANCE MEDICATIONS: Ancef 2 gm IV. The antibiotic was given in an appropriate time interval prior to skin puncture. ANESTHESIA/SEDATION: Moderate (conscious) sedation was employed during this procedure. A total of Versed 1.5 mg was administered intravenously. Moderate Sedation Time: 30 minutes. The patient's level of consciousness and vital signs were monitored continuously by radiology nursing throughout the procedure under my direct supervision. FLUOROSCOPY TIME:  3 minutes, 18 seconds (34 mGy) COMPLICATIONS: None immediate. PROCEDURE: Informed written consent was obtained from the patient after a discussion of the risks, benefits, and alternatives to treatment. Questions regarding the procedure were encouraged and answered. Attention was first paid towards placement of a tunneled central venous catheter. Left neck and chest were prepped and draped in usual sterile fashion. Under direct ultrasound guidance, the left internal jugular  vein was accessed with a micropuncture kit after the overlying soft tissues were anesthetized with 1% lidocaine with epinephrine. This allowed for placement of a peel-away sheath. The microwire was utilized for measurement purposes and a 27 cm dual lumen cuffed central venous catheter was tunneled from the left anterior chest to the venotomy site and inserted through the peel-away sheath with tip ultimately terminating within the superior cavoatrial junction. Postprocedural spot fluoroscopic image was obtained. The catheter was secured at the exit site within interrupted 0 Prolene suture and the venotomy site was closed with Dermabond and Steri-Strips. Attention was now paid towards placement of a tunneled hemodialysis catheter. The right neck and chest were prepped with chlorhexidine in a sterile fashion, and a sterile drape was applied covering the operative field. Maximum barrier sterile technique with sterile gowns and gloves were used for the procedure. A timeout was performed prior to the initiation of the procedure. After creating a small venotomy incision, a micropuncture kit was utilized  to access the internal jugular vein. Real-time ultrasound guidance was utilized for vascular access including the acquisition of a permanent ultrasound image documenting patency of the accessed vessel. The microwire was utilized to measure appropriate catheter length. A stiff Glidewire was advanced to the level of the IVC and the micropuncture sheath was exchanged for a peel-away sheath. A palindrome tunneled hemodialysis catheter measuring 19 cm from tip to cuff was tunneled in a retrograde fashion from the anterior chest wall to the venotomy incision. The catheter was then placed through the peel-away sheath with tips ultimately positioned within the superior aspect of the right atrium. Final catheter positioning was confirmed and documented with a spot radiographic image. The catheter aspirates and flushes normally. The  catheter was flushed with appropriate volume heparin dwells. The catheter exit site was secured with a 0-Prolene retention suture. The venotomy incision was closed with Dermabond and Steri-strips. Dressings were applied. The patient tolerated the procedure well without immediate post procedural complication. IMPRESSION: 1. Successful placement a 27 cm tunneled dual-lumen central venous catheter via the left internal jugular vein with tip terminating within the superior cavoatrial junction. The catheter is ready for immediate usage. 2. Successful placement of 19 cm tip to cuff tunneled hemodialysis catheter via the right internal jugular vein with tips terminating within the superior aspect of the right atrium. The catheter is ready for immediate use. Electronically Signed   By: Simonne Come M.D.   On: 12/24/2023 17:07   IR Fluoro Guide CV Line Right Result Date: 12/24/2023 INDICATION: Poor venous access. In need of durable intravenous access for continuation of hemodialysis. Additionally, patient in need of durable intravenous access for continued medication administration and blood draws. EXAM: 1. TUNNELED DUAL-LUMEN CENTRAL VENOUS CATHETER PLACEMENT WITH ULTRASOUND AND FLUOROSCOPIC GUIDANCE 2. TUNNELED CENTRAL VENOUS HEMODIALYSIS CATHETER PLACEMENT WITH ULTRASOUND AND FLUOROSCOPIC GUIDANCE MEDICATIONS: Ancef 2 gm IV. The antibiotic was given in an appropriate time interval prior to skin puncture. ANESTHESIA/SEDATION: Moderate (conscious) sedation was employed during this procedure. A total of Versed 1.5 mg was administered intravenously. Moderate Sedation Time: 30 minutes. The patient's level of consciousness and vital signs were monitored continuously by radiology nursing throughout the procedure under my direct supervision. FLUOROSCOPY TIME:  3 minutes, 18 seconds (34 mGy) COMPLICATIONS: None immediate. PROCEDURE: Informed written consent was obtained from the patient after a discussion of the risks, benefits,  and alternatives to treatment. Questions regarding the procedure were encouraged and answered. Attention was first paid towards placement of a tunneled central venous catheter. Left neck and chest were prepped and draped in usual sterile fashion. Under direct ultrasound guidance, the left internal jugular vein was accessed with a micropuncture kit after the overlying soft tissues were anesthetized with 1% lidocaine with epinephrine. This allowed for placement of a peel-away sheath. The microwire was utilized for measurement purposes and a 27 cm dual lumen cuffed central venous catheter was tunneled from the left anterior chest to the venotomy site and inserted through the peel-away sheath with tip ultimately terminating within the superior cavoatrial junction. Postprocedural spot fluoroscopic image was obtained. The catheter was secured at the exit site within interrupted 0 Prolene suture and the venotomy site was closed with Dermabond and Steri-Strips. Attention was now paid towards placement of a tunneled hemodialysis catheter. The right neck and chest were prepped with chlorhexidine in a sterile fashion, and a sterile drape was applied covering the operative field. Maximum barrier sterile technique with sterile gowns and gloves were used for the procedure. A timeout  was performed prior to the initiation of the procedure. After creating a small venotomy incision, a micropuncture kit was utilized to access the internal jugular vein. Real-time ultrasound guidance was utilized for vascular access including the acquisition of a permanent ultrasound image documenting patency of the accessed vessel. The microwire was utilized to measure appropriate catheter length. A stiff Glidewire was advanced to the level of the IVC and the micropuncture sheath was exchanged for a peel-away sheath. A palindrome tunneled hemodialysis catheter measuring 19 cm from tip to cuff was tunneled in a retrograde fashion from the anterior chest  wall to the venotomy incision. The catheter was then placed through the peel-away sheath with tips ultimately positioned within the superior aspect of the right atrium. Final catheter positioning was confirmed and documented with a spot radiographic image. The catheter aspirates and flushes normally. The catheter was flushed with appropriate volume heparin dwells. The catheter exit site was secured with a 0-Prolene retention suture. The venotomy incision was closed with Dermabond and Steri-strips. Dressings were applied. The patient tolerated the procedure well without immediate post procedural complication. IMPRESSION: 1. Successful placement a 27 cm tunneled dual-lumen central venous catheter via the left internal jugular vein with tip terminating within the superior cavoatrial junction. The catheter is ready for immediate usage. 2. Successful placement of 19 cm tip to cuff tunneled hemodialysis catheter via the right internal jugular vein with tips terminating within the superior aspect of the right atrium. The catheter is ready for immediate use. Electronically Signed   By: Simonne Come M.D.   On: 12/24/2023 17:07   DG CHEST PORT 1 VIEW Result Date: 12/24/2023 CLINICAL DATA:  Central line placement. EXAM: PORTABLE CHEST 1 VIEW COMPARISON:  Chest x-ray dated December 20, 2023. FINDINGS: Interval exchange of the right internal jugular central venous catheter for a new tunneled right internal jugular dialysis catheter with the tip in the proximal right atrium. New tunneled left internal jugular central venous catheter with tip in the proximal right atrium as well. Unchanged feeding tube tip in the distal stomach. Stable cardiomegaly status post mitral and tricuspid valve replacements. Similar mild central pulmonary vascular congestion. Dense left lower lobe opacity, likely atelectasis, and probable small left pleural effusion are unchanged. No pneumothorax. No acute osseous abnormality. IMPRESSION: 1. New tunneled  bilateral internal jugular catheters with tips in the proximal right atrium. No pneumothorax. 2. Unchanged left basilar atelectasis and probable small left pleural effusion. Electronically Signed   By: Obie Dredge M.D.   On: 12/24/2023 14:49       Medications: Infusions:   prismasol BGK 4/2.5 400 mL/hr at 12/25/23 0000    prismasol BGK 4/2.5 400 mL/hr at 12/25/23 0000   sodium chloride 5 mL/hr at 12/25/23 0700   anticoagulant sodium citrate     feeding supplement (VITAL 1.5 CAL) 40 mL/hr at 12/25/23 0700   heparin 1,050 Units/hr (12/25/23 0700)   meropenem (MERREM) IV Stopped (12/24/23 2311)   norepinephrine (LEVOPHED) Adult infusion 7 mcg/min (12/25/23 0700)   prismasol BGK 4/2.5 1,500 mL/hr at 12/25/23 9629    Scheduled Medications:  amiodarone  200 mg Oral BID   ascorbic acid  250 mg Per Tube BID   Chlorhexidine Gluconate Cloth  6 each Topical Daily   darbepoetin (ARANESP) injection - NON-DIALYSIS  40 mcg Subcutaneous Q Tue-1800   droxidopa  200 mg Per Tube TID WC   feeding supplement (PROSource TF20)  60 mL Per Tube BID   fiber supplement (BANATROL TF)  60 mL  Per Tube QID   Gerhardt's butt cream   Topical BID   influenza vaccine adjuvanted  0.5 mL Intramuscular Tomorrow-1000   insulin aspart  0-24 Units Subcutaneous Q4H   midodrine  20 mg Per Tube Q8H   multivitamin  1 tablet Per Tube BID   QUEtiapine  25 mg Per Tube QHS   sertraline  25 mg Per Tube Daily   Warfarin - Pharmacist Dosing Inpatient   Does not apply q1600   zinc sulfate (50mg  elemental zinc)  220 mg Per Tube Daily    have reviewed scheduled and prn medications.  Physical Exam: General:NAD, comfortable Heart:normal rate, no rub Lungs:  Bilateral chest rise, no iwob Abdomen:soft, Non-tender, non-distended Extremities: Peripheral  edema trace, cool lower extremities  Dialysis Access: .  Right IJ tunneled HD catheter (1/24)  Vanessa [name] 12/25/2023,8:01 AM  LOS: 26 days

## 2023-12-26 DIAGNOSIS — D696 Thrombocytopenia, unspecified: Secondary | ICD-10-CM | POA: Diagnosis not present

## 2023-12-26 DIAGNOSIS — D649 Anemia, unspecified: Secondary | ICD-10-CM | POA: Diagnosis not present

## 2023-12-26 DIAGNOSIS — R570 Cardiogenic shock: Secondary | ICD-10-CM | POA: Diagnosis not present

## 2023-12-26 DIAGNOSIS — Z952 Presence of prosthetic heart valve: Secondary | ICD-10-CM | POA: Diagnosis not present

## 2023-12-26 LAB — RENAL FUNCTION PANEL
Albumin: 2.2 g/dL — ABNORMAL LOW (ref 3.5–5.0)
Albumin: 2.3 g/dL — ABNORMAL LOW (ref 3.5–5.0)
Anion gap: 5 (ref 5–15)
Anion gap: 8 (ref 5–15)
BUN: 15 mg/dL (ref 8–23)
BUN: 17 mg/dL (ref 8–23)
CO2: 27 mmol/L (ref 22–32)
CO2: 26 mmol/L (ref 22–32)
Calcium: 8.1 mg/dL — ABNORMAL LOW (ref 8.9–10.3)
Calcium: 8.3 mg/dL — ABNORMAL LOW (ref 8.9–10.3)
Chloride: 103 mmol/L (ref 98–111)
Chloride: 102 mmol/L (ref 98–111)
Creatinine, Ser: 1.25 mg/dL — ABNORMAL HIGH (ref 0.44–1.00)
Creatinine, Ser: 1.27 mg/dL — ABNORMAL HIGH (ref 0.44–1.00)
GFR, Estimated: 46 mL/min — ABNORMAL LOW (ref >60)
GFR, Estimated: 45 mL/min — ABNORMAL LOW (ref >60)
Glucose, Bld: 129 mg/dL — ABNORMAL HIGH (ref 70–99)
Glucose, Bld: 109 mg/dL — ABNORMAL HIGH (ref 70–99)
Phosphorus: 2.3 mg/dL — ABNORMAL LOW (ref 2.5–4.6)
Phosphorus: 2.4 mg/dL — ABNORMAL LOW (ref 2.5–4.6)
Potassium: 4.5 mmol/L (ref 3.5–5.1)
Potassium: 4.9 mmol/L (ref 3.5–5.1)
Sodium: 135 mmol/L (ref 135–145)
Sodium: 136 mmol/L (ref 135–145)

## 2023-12-26 LAB — GLUCOSE, CAPILLARY
Glucose-Capillary: 100 mg/dL — ABNORMAL HIGH (ref 70–99)
Glucose-Capillary: 103 mg/dL — ABNORMAL HIGH (ref 70–99)
Glucose-Capillary: 111 mg/dL — ABNORMAL HIGH (ref 70–99)
Glucose-Capillary: 127 mg/dL — ABNORMAL HIGH (ref 70–99)
Glucose-Capillary: 130 mg/dL — ABNORMAL HIGH (ref 70–99)
Glucose-Capillary: 135 mg/dL — ABNORMAL HIGH (ref 70–99)
Glucose-Capillary: 93 mg/dL (ref 70–99)

## 2023-12-26 LAB — CBC
HCT: 24.4 % — ABNORMAL LOW (ref 36.0–46.0)
Hemoglobin: 7.3 g/dL — ABNORMAL LOW (ref 12.0–15.0)
MCH: 32.4 pg (ref 26.0–34.0)
MCHC: 29.9 g/dL — ABNORMAL LOW (ref 30.0–36.0)
MCV: 108.4 fL — ABNORMAL HIGH (ref 80.0–100.0)
Platelets: 124 10*3/uL — ABNORMAL LOW (ref 150–400)
RBC: 2.25 MIL/uL — ABNORMAL LOW (ref 3.87–5.11)
RDW: 24.6 % — ABNORMAL HIGH (ref 11.5–15.5)
WBC: 7.9 10*3/uL (ref 4.0–10.5)
nRBC: 0.5 % — ABNORMAL HIGH (ref 0.0–0.2)

## 2023-12-26 LAB — PROTIME-INR
INR: 1.1 (ref 0.8–1.2)
Prothrombin Time: 14.7 s (ref 11.4–15.2)

## 2023-12-26 LAB — COOXEMETRY PANEL
Carboxyhemoglobin: 2.8 % — ABNORMAL HIGH (ref 0.5–1.5)
Methemoglobin: 0.9 % (ref 0.0–1.5)
O2 Saturation: 75.3 %
Total hemoglobin: 7.7 g/dL — ABNORMAL LOW (ref 12.0–16.0)

## 2023-12-26 LAB — APTT: aPTT: 127 s — ABNORMAL HIGH (ref 24–36)

## 2023-12-26 LAB — HEPARIN LEVEL (UNFRACTIONATED): Heparin Unfractionated: 0.5 [IU]/mL (ref 0.30–0.70)

## 2023-12-26 LAB — MAGNESIUM: Magnesium: 2.6 mg/dL — ABNORMAL HIGH (ref 1.7–2.4)

## 2023-12-26 MED ORDER — WARFARIN SODIUM 5 MG PO TABS
5.0000 mg | ORAL_TABLET | Freq: Once | ORAL | Status: AC
  Administered 2023-12-26: 5 mg via ORAL
  Filled 2023-12-26: qty 1

## 2023-12-26 MED ORDER — DARBEPOETIN ALFA 100 MCG/0.5ML IJ SOSY
100.0000 ug | PREFILLED_SYRINGE | INTRAMUSCULAR | Status: DC
  Administered 2023-12-28 – 2024-01-04 (×2): 100 ug via SUBCUTANEOUS
  Filled 2023-12-26 (×4): qty 0.5

## 2023-12-26 MED ORDER — PNEUMOCOCCAL 20-VAL CONJ VACC 0.5 ML IM SUSY: 0.5000 mL | PREFILLED_SYRINGE | INTRAMUSCULAR | Status: DC | PRN

## 2023-12-26 NOTE — Progress Notes (Signed)
[name] PROGRESS NOTE  Assessment/ Plan: Pt is a 73 y.o. yo female  with a past medical history HTN, NICM, CHF, pulmonary hypertension, mitral and tricuspid valve disease who present w/ surgery for valvular disease c/b shock, AHRF, and AKI   1)  Anuric AKI on CKD 3A: Multifactorial etiology including cardiorenal syndrome and ischemic ATN due to shock.  Started CRRT on 1/2 for fluid overload.  The CRRT was held on 1/12 because of worsening leukocytosis and poor access with plan to have line holiday. IHD on 1/13 with IDH and no UF.   Resumed CRRT on 1/14-1/20 (new cath on 1/14) to manage volume. CRRT 1/22-   No UOP -> BladderScan on 1/22 0mL  Seen on CRRT, 4K bath  No further issues with high venous pressures.  CVP only 4 and running even.  Co-ox 75%.  2) #Multifactorial shock: Vasoplegic and cardioplegia catheter surgery.   - midodrine incr to 20mg  TID, milrinone off on 1/20, droxidopa trial and norepi currently at 3. Cont mgmt per primary team   3) Acute exacerbation of heart failure with reduced ejection fraction: Volume overload. Optimized with CRRT   4) Severe MR and tricuspid valvular disease:  - Status post repair.  CT surgery managing   5) Postoperative cardiac tamponade:  - Continue monitoring per cardiology   6)  Hyponatremia: improved with CRRT    7) Anemia: Related to blood loss and compounded by AKI and critical illness.    - Transfusions per primary team.   - aranesp 40 mcg weekly on Tuesdays -> incr to 100  8)  Thrombocytopenia: switched from heparin to coumadin.    Subjective: Off CRRT on 1/20 (running even), off  milrinone on 1/20. No complaints, in better spirits today.  Breathing comfortably with a CVP only 4.  Co-ox 75%, Levo 2-3   Objective Vital signs in last 24 hours: Vitals:   12/26/23 0730 12/26/23 0745 12/26/23 0800 12/26/23 0815  BP: (!) 89/54 (!) 85/57 (!) 85/54 (!) 93/56  Pulse: 79 80 78 76  Resp: (!) 25 (!) 31 (!) 24  (!) 25  Temp:      TempSrc:      SpO2: 96% 97% 98% 97%  Weight:      Height:       Weight change: -1.5 kg  Intake/Output Summary (Last 24 hours) at 12/26/2023 0817 Last data filed at 12/26/2023 0800 Gross per 24 hour  Intake 2122.3 ml  Output 2291.2 ml  Net -168.9 ml       Labs: RENAL PANEL Recent Labs  Lab 12/22/23 0344 12/22/23 1701 12/23/23 0408 12/23/23 1623 12/24/23 0503 12/24/23 1502 12/25/23 0414 12/25/23 1536 12/26/23 0348  NA 131*   < > 130*   < > 136 133* 134* 136 135  K 5.1   < > 4.4   < > 4.5 4.4 4.4 4.6 4.5  CL 94*   < > 96*   < > 102 98 103 102 103  CO2 23   < > 26   < > 27 24 26 27 27   GLUCOSE 132*   < > 150*   < > 121* 160* 143* 136* 129*  BUN 51*   < > 21   < > 13 17 13 13 15   CREATININE 4.18*   < > 1.71*   < > 1.12* 1.31* 1.05* 0.99 1.25*  CALCIUM 9.1   < > 7.9*   < > 8.1* 7.9* 7.9* 8.3* 8.1*  MG 2.7*  --  2.5*  --  2.6*  --  2.5*  --  2.6*  PHOS 7.9*   < > 3.0   < > 2.3* 2.3* 2.2* 1.8* 2.3*  ALBUMIN 2.4*   < > 2.3*   < > 2.2* 2.3* 2.2* 2.3* 2.2*   < > = values in this interval not displayed.    Liver Function Tests: Recent Labs  Lab 12/25/23 0414 12/25/23 1536 12/26/23 0348  ALBUMIN 2.2* 2.3* 2.2*   No results for input(s): "LIPASE", "AMYLASE" in the last 168 hours. No results for input(s): "AMMONIA" in the last 168 hours.  CBC: Recent Labs    12/23/23 0408 12/23/23 1623 12/24/23 0503 12/25/23 0414 12/26/23 0348  HGB 7.8* 7.4* 7.4* 7.5* 7.3*  MCV 107.2* 108.3* 108.2* 109.6* 108.4*    Cardiac Enzymes: No results for input(s): "CKTOTAL", "CKMB", "CKMBINDEX", "TROPONINI" in the last 168 hours. CBG: Recent Labs  Lab 12/25/23 1538 12/25/23 1939 12/26/23 0020 12/26/23 0344 12/26/23 0749  GLUCAP 123* 91 111* 130* 135*    Iron Studies: No results for input(s): "IRON", "TIBC", "TRANSFERRIN", "FERRITIN" in the last 72 hours. Studies/Results: IR US Guide Vasc Access Left Result Date: 12/24/2023 INDICATION: Poor venous access.  In need of durable intravenous access for continuation of hemodialysis. Additionally, patient in need of durable intravenous access for continued medication administration and blood draws. EXAM: 1. TUNNELED DUAL-LUMEN CENTRAL VENOUS CATHETER PLACEMENT WITH ULTRASOUND AND FLUOROSCOPIC GUIDANCE 2. TUNNELED CENTRAL VENOUS HEMODIALYSIS CATHETER PLACEMENT WITH ULTRASOUND AND FLUOROSCOPIC GUIDANCE MEDICATIONS: Ancef 2 gm IV. The antibiotic was given in an appropriate time interval prior to skin puncture. ANESTHESIA/SEDATION: Moderate (conscious) sedation was employed during this procedure. A total of Versed 1.5 mg was administered intravenously. Moderate Sedation Time: 30 minutes. The patient's level of consciousness and vital signs were monitored continuously by radiology nursing throughout the procedure under my direct supervision. FLUOROSCOPY TIME:  3 minutes, 18 seconds (34 mGy) COMPLICATIONS: None immediate. PROCEDURE: Informed written consent was obtained from the patient after a discussion of the risks, benefits, and alternatives to treatment. Questions regarding the procedure were encouraged and answered. Attention was first paid towards placement of a tunneled central venous catheter. Left neck and chest were prepped and draped in usual sterile fashion. Under direct ultrasound guidance, the left internal jugular vein was accessed with a micropuncture kit after the overlying soft tissues were anesthetized with 1% lidocaine with epinephrine. This allowed for placement of a peel-away sheath. The microwire was utilized for measurement purposes and a 27 cm dual lumen cuffed central venous catheter was tunneled from the left anterior chest to the venotomy site and inserted through the peel-away sheath with tip ultimately terminating within the superior cavoatrial junction. Postprocedural spot fluoroscopic image was obtained. The catheter was secured at the exit site within interrupted 0 Prolene suture and the venotomy  site was closed with Dermabond and Steri-Strips. Attention was now paid towards placement of a tunneled hemodialysis catheter. The right neck and chest were prepped with chlorhexidine in a sterile fashion, and a sterile drape was applied covering the operative field. Maximum barrier sterile technique with sterile gowns and gloves were used for the procedure. A timeout was performed prior to the initiation of the procedure. After creating a small venotomy incision, a micropuncture kit was utilized to access the internal jugular vein. Real-time ultrasound guidance was utilized for vascular access including the acquisition of a permanent ultrasound image documenting patency of the accessed vessel. The microwire was utilized to measure appropriate catheter length. A stiff Glidewire  was advanced to the level of the IVC and the micropuncture sheath was exchanged for a peel-away sheath. A palindrome tunneled hemodialysis catheter measuring 19 cm from tip to cuff was tunneled in a retrograde fashion from the anterior chest wall to the venotomy incision. The catheter was then placed through the peel-away sheath with tips ultimately positioned within the superior aspect of the right atrium. Final catheter positioning was confirmed and documented with a spot radiographic image. The catheter aspirates and flushes normally. The catheter was flushed with appropriate volume heparin dwells. The catheter exit site was secured with a 0-Prolene retention suture. The venotomy incision was closed with Dermabond and Steri-strips. Dressings were applied. The patient tolerated the procedure well without immediate post procedural complication. IMPRESSION: 1. Successful placement a 27 cm tunneled dual-lumen central venous catheter via the left internal jugular vein with tip terminating within the superior cavoatrial junction. The catheter is ready for immediate usage. 2. Successful placement of 19 cm tip to cuff tunneled hemodialysis  catheter via the right internal jugular vein with tips terminating within the superior aspect of the right atrium. The catheter is ready for immediate use. Electronically Signed   By: Simonne Come M.D.   On: 12/24/2023 17:07   IR Fluoro Guide CV Line Left Result Date: 12/24/2023 INDICATION: Poor venous access. In need of durable intravenous access for continuation of hemodialysis. Additionally, patient in need of durable intravenous access for continued medication administration and blood draws. EXAM: 1. TUNNELED DUAL-LUMEN CENTRAL VENOUS CATHETER PLACEMENT WITH ULTRASOUND AND FLUOROSCOPIC GUIDANCE 2. TUNNELED CENTRAL VENOUS HEMODIALYSIS CATHETER PLACEMENT WITH ULTRASOUND AND FLUOROSCOPIC GUIDANCE MEDICATIONS: Ancef 2 gm IV. The antibiotic was given in an appropriate time interval prior to skin puncture. ANESTHESIA/SEDATION: Moderate (conscious) sedation was employed during this procedure. A total of Versed 1.5 mg was administered intravenously. Moderate Sedation Time: 30 minutes. The patient's level of consciousness and vital signs were monitored continuously by radiology nursing throughout the procedure under my direct supervision. FLUOROSCOPY TIME:  3 minutes, 18 seconds (34 mGy) COMPLICATIONS: None immediate. PROCEDURE: Informed written consent was obtained from the patient after a discussion of the risks, benefits, and alternatives to treatment. Questions regarding the procedure were encouraged and answered. Attention was first paid towards placement of a tunneled central venous catheter. Left neck and chest were prepped and draped in usual sterile fashion. Under direct ultrasound guidance, the left internal jugular vein was accessed with a micropuncture kit after the overlying soft tissues were anesthetized with 1% lidocaine with epinephrine. This allowed for placement of a peel-away sheath. The microwire was utilized for measurement purposes and a 27 cm dual lumen cuffed central venous catheter was tunneled  from the left anterior chest to the venotomy site and inserted through the peel-away sheath with tip ultimately terminating within the superior cavoatrial junction. Postprocedural spot fluoroscopic image was obtained. The catheter was secured at the exit site within interrupted 0 Prolene suture and the venotomy site was closed with Dermabond and Steri-Strips. Attention was now paid towards placement of a tunneled hemodialysis catheter. The right neck and chest were prepped with chlorhexidine in a sterile fashion, and a sterile drape was applied covering the operative field. Maximum barrier sterile technique with sterile gowns and gloves were used for the procedure. A timeout was performed prior to the initiation of the procedure. After creating a small venotomy incision, a micropuncture kit was utilized to access the internal jugular vein. Real-time ultrasound guidance was utilized for vascular access including the acquisition of a permanent  ultrasound image documenting patency of the accessed vessel. The microwire was utilized to measure appropriate catheter length. A stiff Glidewire was advanced to the level of the IVC and the micropuncture sheath was exchanged for a peel-away sheath. A palindrome tunneled hemodialysis catheter measuring 19 cm from tip to cuff was tunneled in a retrograde fashion from the anterior chest wall to the venotomy incision. The catheter was then placed through the peel-away sheath with tips ultimately positioned within the superior aspect of the right atrium. Final catheter positioning was confirmed and documented with a spot radiographic image. The catheter aspirates and flushes normally. The catheter was flushed with appropriate volume heparin dwells. The catheter exit site was secured with a 0-Prolene retention suture. The venotomy incision was closed with Dermabond and Steri-strips. Dressings were applied. The patient tolerated the procedure well without immediate post procedural  complication. IMPRESSION: 1. Successful placement a 27 cm tunneled dual-lumen central venous catheter via the left internal jugular vein with tip terminating within the superior cavoatrial junction. The catheter is ready for immediate usage. 2. Successful placement of 19 cm tip to cuff tunneled hemodialysis catheter via the right internal jugular vein with tips terminating within the superior aspect of the right atrium. The catheter is ready for immediate use. Electronically Signed   By: Simonne Come M.D.   On: 12/24/2023 17:07   IR US Guide Vasc Access Right Result Date: 12/24/2023 INDICATION: Poor venous access. In need of durable intravenous access for continuation of hemodialysis. Additionally, patient in need of durable intravenous access for continued medication administration and blood draws. EXAM: 1. TUNNELED DUAL-LUMEN CENTRAL VENOUS CATHETER PLACEMENT WITH ULTRASOUND AND FLUOROSCOPIC GUIDANCE 2. TUNNELED CENTRAL VENOUS HEMODIALYSIS CATHETER PLACEMENT WITH ULTRASOUND AND FLUOROSCOPIC GUIDANCE MEDICATIONS: Ancef 2 gm IV. The antibiotic was given in an appropriate time interval prior to skin puncture. ANESTHESIA/SEDATION: Moderate (conscious) sedation was employed during this procedure. A total of Versed 1.5 mg was administered intravenously. Moderate Sedation Time: 30 minutes. The patient's level of consciousness and vital signs were monitored continuously by radiology nursing throughout the procedure under my direct supervision. FLUOROSCOPY TIME:  3 minutes, 18 seconds (34 mGy) COMPLICATIONS: None immediate. PROCEDURE: Informed written consent was obtained from the patient after a discussion of the risks, benefits, and alternatives to treatment. Questions regarding the procedure were encouraged and answered. Attention was first paid towards placement of a tunneled central venous catheter. Left neck and chest were prepped and draped in usual sterile fashion. Under direct ultrasound guidance, the left  internal jugular vein was accessed with a micropuncture kit after the overlying soft tissues were anesthetized with 1% lidocaine with epinephrine. This allowed for placement of a peel-away sheath. The microwire was utilized for measurement purposes and a 27 cm dual lumen cuffed central venous catheter was tunneled from the left anterior chest to the venotomy site and inserted through the peel-away sheath with tip ultimately terminating within the superior cavoatrial junction. Postprocedural spot fluoroscopic image was obtained. The catheter was secured at the exit site within interrupted 0 Prolene suture and the venotomy site was closed with Dermabond and Steri-Strips. Attention was now paid towards placement of a tunneled hemodialysis catheter. The right neck and chest were prepped with chlorhexidine in a sterile fashion, and a sterile drape was applied covering the operative field. Maximum barrier sterile technique with sterile gowns and gloves were used for the procedure. A timeout was performed prior to the initiation of the procedure. After creating a small venotomy incision, a micropuncture kit was utilized  to access the internal jugular vein. Real-time ultrasound guidance was utilized for vascular access including the acquisition of a permanent ultrasound image documenting patency of the accessed vessel. The microwire was utilized to measure appropriate catheter length. A stiff Glidewire was advanced to the level of the IVC and the micropuncture sheath was exchanged for a peel-away sheath. A palindrome tunneled hemodialysis catheter measuring 19 cm from tip to cuff was tunneled in a retrograde fashion from the anterior chest wall to the venotomy incision. The catheter was then placed through the peel-away sheath with tips ultimately positioned within the superior aspect of the right atrium. Final catheter positioning was confirmed and documented with a spot radiographic image. The catheter aspirates and  flushes normally. The catheter was flushed with appropriate volume heparin dwells. The catheter exit site was secured with a 0-Prolene retention suture. The venotomy incision was closed with Dermabond and Steri-strips. Dressings were applied. The patient tolerated the procedure well without immediate post procedural complication. IMPRESSION: 1. Successful placement a 27 cm tunneled dual-lumen central venous catheter via the left internal jugular vein with tip terminating within the superior cavoatrial junction. The catheter is ready for immediate usage. 2. Successful placement of 19 cm tip to cuff tunneled hemodialysis catheter via the right internal jugular vein with tips terminating within the superior aspect of the right atrium. The catheter is ready for immediate use. Electronically Signed   By: Simonne Come M.D.   On: 12/24/2023 17:07   IR Fluoro Guide CV Line Right Result Date: 12/24/2023 INDICATION: Poor venous access. In need of durable intravenous access for continuation of hemodialysis. Additionally, patient in need of durable intravenous access for continued medication administration and blood draws. EXAM: 1. TUNNELED DUAL-LUMEN CENTRAL VENOUS CATHETER PLACEMENT WITH ULTRASOUND AND FLUOROSCOPIC GUIDANCE 2. TUNNELED CENTRAL VENOUS HEMODIALYSIS CATHETER PLACEMENT WITH ULTRASOUND AND FLUOROSCOPIC GUIDANCE MEDICATIONS: Ancef 2 gm IV. The antibiotic was given in an appropriate time interval prior to skin puncture. ANESTHESIA/SEDATION: Moderate (conscious) sedation was employed during this procedure. A total of Versed 1.5 mg was administered intravenously. Moderate Sedation Time: 30 minutes. The patient's level of consciousness and vital signs were monitored continuously by radiology nursing throughout the procedure under my direct supervision. FLUOROSCOPY TIME:  3 minutes, 18 seconds (34 mGy) COMPLICATIONS: None immediate. PROCEDURE: Informed written consent was obtained from the patient after a discussion  of the risks, benefits, and alternatives to treatment. Questions regarding the procedure were encouraged and answered. Attention was first paid towards placement of a tunneled central venous catheter. Left neck and chest were prepped and draped in usual sterile fashion. Under direct ultrasound guidance, the left internal jugular vein was accessed with a micropuncture kit after the overlying soft tissues were anesthetized with 1% lidocaine with epinephrine. This allowed for placement of a peel-away sheath. The microwire was utilized for measurement purposes and a 27 cm dual lumen cuffed central venous catheter was tunneled from the left anterior chest to the venotomy site and inserted through the peel-away sheath with tip ultimately terminating within the superior cavoatrial junction. Postprocedural spot fluoroscopic image was obtained. The catheter was secured at the exit site within interrupted 0 Prolene suture and the venotomy site was closed with Dermabond and Steri-Strips. Attention was now paid towards placement of a tunneled hemodialysis catheter. The right neck and chest were prepped with chlorhexidine in a sterile fashion, and a sterile drape was applied covering the operative field. Maximum barrier sterile technique with sterile gowns and gloves were used for the procedure. A timeout  was performed prior to the initiation of the procedure. After creating a small venotomy incision, a micropuncture kit was utilized to access the internal jugular vein. Real-time ultrasound guidance was utilized for vascular access including the acquisition of a permanent ultrasound image documenting patency of the accessed vessel. The microwire was utilized to measure appropriate catheter length. A stiff Glidewire was advanced to the level of the IVC and the micropuncture sheath was exchanged for a peel-away sheath. A palindrome tunneled hemodialysis catheter measuring 19 cm from tip to cuff was tunneled in a retrograde fashion  from the anterior chest wall to the venotomy incision. The catheter was then placed through the peel-away sheath with tips ultimately positioned within the superior aspect of the right atrium. Final catheter positioning was confirmed and documented with a spot radiographic image. The catheter aspirates and flushes normally. The catheter was flushed with appropriate volume heparin dwells. The catheter exit site was secured with a 0-Prolene retention suture. The venotomy incision was closed with Dermabond and Steri-strips. Dressings were applied. The patient tolerated the procedure well without immediate post procedural complication. IMPRESSION: 1. Successful placement a 27 cm tunneled dual-lumen central venous catheter via the left internal jugular vein with tip terminating within the superior cavoatrial junction. The catheter is ready for immediate usage. 2. Successful placement of 19 cm tip to cuff tunneled hemodialysis catheter via the right internal jugular vein with tips terminating within the superior aspect of the right atrium. The catheter is ready for immediate use. Electronically Signed   By: Simonne Come M.D.   On: 12/24/2023 17:07   DG CHEST PORT 1 VIEW Result Date: 12/24/2023 CLINICAL DATA:  Central line placement. EXAM: PORTABLE CHEST 1 VIEW COMPARISON:  Chest x-ray dated December 20, 2023. FINDINGS: Interval exchange of the right internal jugular central venous catheter for a new tunneled right internal jugular dialysis catheter with the tip in the proximal right atrium. New tunneled left internal jugular central venous catheter with tip in the proximal right atrium as well. Unchanged feeding tube tip in the distal stomach. Stable cardiomegaly status post mitral and tricuspid valve replacements. Similar mild central pulmonary vascular congestion. Dense left lower lobe opacity, likely atelectasis, and probable small left pleural effusion are unchanged. No pneumothorax. No acute osseous abnormality.  IMPRESSION: 1. New tunneled bilateral internal jugular catheters with tips in the proximal right atrium. No pneumothorax. 2. Unchanged left basilar atelectasis and probable small left pleural effusion. Electronically Signed   By: Obie Dredge M.D.   On: 12/24/2023 14:49       Medications: Infusions:   prismasol BGK 4/2.5 400 mL/hr at 12/26/23 0130    prismasol BGK 4/2.5 400 mL/hr at 12/26/23 0129   sodium chloride Stopped (12/26/23 0018)   anticoagulant sodium citrate     feeding supplement (VITAL 1.5 CAL) 40 mL/hr at 12/26/23 0800   heparin 1,050 Units/hr (12/26/23 0800)   meropenem (MERREM) IV Stopped (12/25/23 2217)   norepinephrine (LEVOPHED) Adult infusion 2 mcg/min (12/26/23 0800)   prismasol BGK 4/2.5 1,500 mL/hr at 12/26/23 1610    Scheduled Medications:  amiodarone  200 mg Oral BID   ascorbic acid  250 mg Per Tube BID   Chlorhexidine Gluconate Cloth  6 each Topical Daily   darbepoetin (ARANESP) injection - NON-DIALYSIS  40 mcg Subcutaneous Q Tue-1800   droxidopa  200 mg Per Tube TID WC   feeding supplement (PROSource TF20)  60 mL Per Tube BID   fiber supplement (BANATROL TF)  60 mL Per Tube  QID   Gerhardt's butt cream   Topical BID   influenza vaccine adjuvanted  0.5 mL Intramuscular Tomorrow-1000   insulin aspart  0-24 Units Subcutaneous Q4H   midodrine  20 mg Per Tube Q8H   mirtazapine  7.5 mg Oral QHS   multivitamin  1 tablet Per Tube BID   QUEtiapine  25 mg Per Tube QHS   sertraline  25 mg Per Tube Daily   Warfarin - Pharmacist Dosing Inpatient   Does not apply q1600   zinc sulfate (50mg  elemental zinc)  220 mg Per Tube Daily    have reviewed scheduled and prn medications.  Physical Exam: General:NAD, comfortable Heart:normal rate, no rub Lungs:  Bilateral chest rise, no iwob Abdomen:soft, Non-tender, non-distended Extremities: Peripheral  edema trace, cool lower extremities  Dialysis Access: .  Right IJ tunneled HD catheter (1/24)  Annelie Boak  W 12/26/2023,8:17 AM  LOS: 27 days

## 2023-12-26 NOTE — Progress Notes (Signed)
PHARMACY - ANTICOAGULATION CONSULT NOTE  Pharmacy Consult for IV heparin + warfarin Indication: atrial fibrillation  No Known Allergies  Patient Measurements: Height: 5\' 3"  (160 cm) Weight: 78 kg (171 lb 15.3 oz) IBW/kg (Calculated) : 52.4 Heparin Dosing Weight: ~ 70 kg  Vital Signs: Temp: 98.8 F (37.1 C) (01/26 1200) Temp Source: Oral (01/26 1200) BP: 84/59 (01/26 1200) Pulse Rate: 82 (01/26 1200)  Labs: Recent Labs    12/24/23 0503 12/24/23 1502 12/24/23 1809 12/25/23 0414 12/25/23 0952 12/25/23 1536 12/26/23 0348  HGB 7.4*  --   --  7.5*  --   --  7.3*  HCT 25.0*  --   --  25.1*  --   --  24.4*  PLT 105*  --   --  118*  --   --  124*  APTT 124*  --   --  112*  --   --  127*  LABPROT  --  14.9  --   --  15.1  --  14.7  INR  --  1.2  --   --  1.2  --  1.1  HEPARINUNFRC 0.42  --  0.36 0.49  --   --  0.50  CREATININE 1.12* 1.31*  --  1.05*  --  0.99 1.25*    Estimated Creatinine Clearance: 40.2 mL/min (A) (by C-G formula based on SCr of 1.25 mg/dL (H)).  Warfarin doses received (INR): 1/15 - 2.5 (1.1) 1/16 - 2.5 (1.1) 1/17 - 2.5 (1.2) 1/18 - 2.5 (1.2) 1/19 - 5 (1.3) 1/20 - 5 (1.3) 1/21 >> 1/24 - held for tunneled access   Assessment: 73 yo female s/p scheduled bioprosthetic mitral valve replacement with tissue valve 12/30 now with afib, pharmacy asked to dose anticoagulation with IV heparin. Spontaneously converted 1/7; flipped back to AF overnight 1/9 0300. She is noted on CRRT. Baseline INR 1.1, slow uptrend to 1.3 s/p 6 days warfarin therapy, total dose 20 mg, then held 1/21-24 for tunneled access plans. Patient now s/p [facility] and tunneled central line placement 1/24 AM.   HL therapeutic this morning at 0.5 on 1050 units/hr since 1/22. CBC remains low-stable, Still requiring norepinephrine, midodrine, and droxidopa (added 1/21) for hemodynamic tolerance of dialysis. Per NP with VIR okay to resume anticoagulation after tunneled cath 1/24.  Today's INR pending.    Goal of Therapy:  Heparin level 0.3-0.5 units/ml INR goal 2 - 3 (bMVR) Monitor platelets by anticoagulation protocol: Yes   Plan:  Continue IV heparin at 1050 units/hr Warfarin 5 mg  tonight. Daily heparin level, CBC and INR.  Reece Leader, Colon Flattery, BCCP Clinical Pharmacist  12/26/2023 1:21 PM   [facility] pharmacy phone numbers are listed on [email or web address]

## 2023-12-26 NOTE — Progress Notes (Addendum)
NAME:  Vanessa [name], MRN:  [id number], DOB:  28-Aug-1951, LOS: 27 ADMISSION DATE:  11/29/2023, CONSULTATION DATE:  11/29/23 REFERRING MD:  Dr. Leafy Ro, CHIEF COMPLAINT:  postop   History of Present Illness:  72yoF with PMH significant for HTN, NICM, HFrEF (EF40-45%), PHTN, CKD and hx of prior severe MR s/p prior mitral clip x 2 in 2020.  Pt with progressive DOE found to have increasing MR  and moderate TR despite adjustments in GDMT.  Not felt to be candidate for additional mitral clip.  Repeat LHC without CAD but showed moderate PHTN.  Underwent mitral valve replacement with tissue valve and tricuspid valve repair by Dr. Leafy Ro on 12/30.  PCCM consulted to assist with vent and medical management post-operatively in ICU.   Pertinent  Medical History  Never smoker, MR w/ previous Mitral clip x 2 (2020), NICM, HFrEF, PHTN, HTN, CKD3b  Significant Hospital Events: Including procedures, antibiotic start and stop dates in addition to other pertinent events   MVR by Dr. Leafy Ro; relook for tamponade/washout later in evening without obvious source 1/2 intubated for respiratory decompensation, iNO started. Vanc, meropenem started. CRRT started.  1/5 extubated 1/8 converted to NSR overnight 1/9 back to A.fib around 0200 1/11 R introducer out.  Unable to thread right subclavian catheter 1/12 remains weak still pressor dependent 1/13 repeat [facility] sent. Still on pressors..,  Zosyn changed to meropenem.  Chest abdomen and pelvis CT obtained : Moderate hemorrhagic pericardial effusion, 5.3 cm hematoma right thrombus. Arterial gas bilateral patchy opacities mild dependent posterior right upper lobe atelectasis was unremarkable layering gallstones; neg for tamponade on repeat echo 1/14 new right IJ HD catheter placed.  The echocardiogram Was obtained to better evaluate her EF is 35 to 40%.  The LV demonstrates regional wall motion abnormalities there is asymmetric left ventricular hypertrophy of the inferior  lateral segment RV systolic function moderate regular reduced left atrial wall dilated no mention of clot.  1/15 still on fairly high-dose norepinephrine 1/16 cont CRRT + low dose NE  1/17 RHC confirms low output state, restarted on milrinone 1/20 CRRT stopped 1/21: dc milrinone yesterday, ongoing increase NE requirements during nights. Started droxidopa in addition to midodrine to aid off NE.  1/22: levo up to 18 overnight (max so far), cvp 9, restarting crrt today. Received dose methylene blue, Cortisol 21 1/23 Levo down to 9, CVP 8 1/24 tunneled HD and tunneled dual lumen LIJ CVC placed   Interim History / Subjective:  CVP 8-9 Coox 75.3% Remains on CRRT UF even balance NE Feels better, still weak.  Ate bacon, some hashbrowns this morning but still poor appetite, slept better  Objective   Blood pressure (!) 85/57, pulse 80, temperature 98.5 F (36.9 C), temperature source Axillary, resp. rate (!) 31, height 5\' 3"  (1.6 m), weight 78 kg, SpO2 97%. CVP:  [2 mmHg-12 mmHg] 2 mmHg      Intake/Output Summary (Last 24 hours) at 12/26/2023 0805 Last data filed at 12/26/2023 0700 Gross per 24 hour  Intake 2059.55 ml  Output 2230.2 ml  Net -170.65 ml   Filed Weights   12/23/23 0434 12/25/23 0400 12/26/23 0500  Weight: 77.1 kg 79.5 kg 78 kg   Examination:   General:  AoC ill and frail appearing older female sitting upright in bed in NAD HEENT: MM pink/moist, pupils 3/r, cortrak Neuro:  alert, oriented x3, MAE generalized weakness CV: rr, NSR, no murmur, tunneled L chest DL CVL, R TDC  PULM:  non labored, clear  but shallow, bibasilar diminished GI: soft, bs+, NT, ND, rectal pouch clean Extremities: warm/dry, TED to LE  Skin: no rashes or lesions  Labs> K 4.5, Na 135, sCr 0.99> 1.25 on CRRT, phos 2.3, Mag 2.6, WBC 7.9, H/H 7.5/ 25.1> 7.3/ 24, plts 118> 124, INR 1.1  Anuric Afebrile Wts 77.1> 79.5> 78kg -275ml/ 24hrs UF -1.9L/ 24hrs Net +5.3L Imaging/ micro  reviewed  Resolved Hospital Problem list :   Postoperative vent management Postop tamponade resolved after relook and washout on day 1  Assessment & Plan:   Sev mitral regurg s/p prior mitral clip, now s/p MVR Moderate tricuspid regurgitation s/p TV repair Persistent cardioplegia postoperatively- confirmed on RHC pHTN AoC HFrEF NICM pAF, flutter  S/p MVR and tricuspid ring 12/30 w/ Weldner. Back to OR for tamponade. Decompensated requiring intubation for FVO, renal failure, pHTN. 1/17 RHC with low filling pressures and started on milrinone, subsequently off.  P:  - per AHF,  greatly appreciate assistance  - tele- no further ectopy, remains NSR, cont amio 200mg  BID - heparin gtt with warfarin bridge started 1/15, INR 1 - optimize lytes per Nephrology - cont to wean NE for SBP 90 - cont midodrine 20mg  TID, droxidopa 200mg  TID, s/p methylene blue 1/22 - TED hose - trending CVP and coox   R/o Sepsis,  unclear source -  pan scan 1/13 with patchy lower lobe opacities, no AP findings.  No definite source. Lines were replaced 1/24.  Off meropenem since 1/25.  BC and UC neg 1/13, - remains afebrile with normal WBC, improving thrombocytopenia.  Monitor clinically off abx   Acute renal failure on CKD III, on CRRT 1/14 CRRT started, stopped 1/20., restarted 1/22  - con't CRRT per nephro - remains anuric - renal indices BID - renally dose meds, avoid nephrotoxic meds -strict I/O  Protein calorie malnutrition, severe Early satiety -con't cortrak and full TF; going to nocturnal and less kcal didn't improve her appetite - mirtazapine add 1/25 to hopefully help appetite, monitor for oversedation on seroquel  Acute on chronic anemia Thrombocytopenia, potentially due to antibiotics, doubt sepsis, improving - stable CBC, trend   Physical deconditioning  - PT, OT, mobility remains limited with CRRT  Deep tissue injury on left buttocks approximately 2 cm in diameter purple  discoloration - WOC, turns, maximize nutrition   Situational depression Sleep disturbance - con't melatonin prn, trazodone prn,  seroquel 25mg  qHS, mirtazepine 7.5mg  qhs - zoloft 25mg  daily   Hyperglycemia/ prediabetes - A1c 6 11/25/2023 - SSI prn, CBG q4  Best Practice (right click and "Reselect all SmartList Selections" daily)   Diet/type: Encourage PO, no appetite, supplement TF DVT prophylaxis systemic heparin/ warfarin Pressure ulcer(s): L buttocks  GI prophylaxis: N/A Lines: HD cath 1/24, tunneled CVC 1/24 Foley:  N/A Code Status:  full code Last date of multidisciplinary goals of care discussion [per primary]  CCT: 38 mins   Posey Boyer, MSN, AG-ACNP-BC [facility] 12/26/2023, 8:05 AM  See Amion for pager If no response to pager , please call [phone] until 7pm After 7:00 pm call Elink  [phone]

## 2023-12-26 NOTE — Progress Notes (Signed)
Patient ID: Vanessa [name], female   DOB: 1951-09-14, 73 y.o.   MRN: [id number]      Advanced Heart Failure Rounding Note  Cardiologist: Dr. Shirlee Latch  Chief Complaint: Cardiogenic Shock  Subjective:   12/30 S/P MVR (tissue) and TV ring and same day return to OR for tamponade/bleeding. 1/2: Progressive volume overload with poor UOP, CVVH started but ended up re-intubated.   Extubated 12/05/23 1/12: CVVH stopped 1/13: iHD, HD line removed 1/14: CRRT Restarted.  1/17: RHC mean RA 2, PA 47/20, mean PCWP 5, CI 1.74, PVR 8, PAPi 9.  Milrinone 0.125 started. 1/20 CRRT stopped.  1/22 methylene blue for persistent vasoplegia  1/24 tunneled HD cath placed  NE down to 2. On midodrine 20 and droxydopa  Remains anuric On CVVHD keeping event to slightly negative. CVP 7  Coox 75% Weight up 1 pound  Feels weak. Denies CP or SOB. Says she wants to "keep fighting" + Nausea  Objective:   Weight Range: 78 kg Body mass index is 30.46 kg/m.   Vital Signs:   Temp:  [97.7 F (36.5 C)-98.5 F (36.9 C)] 97.7 F (36.5 C) (01/26 0800) Pulse Rate:  [69-84] 82 (01/26 1015) Resp:  [12-55] 25 (01/26 1015) BP: (68-118)/(44-79) 94/52 (01/26 1015) SpO2:  [90 %-100 %] 95 % (01/26 1015) Weight:  [78 kg] 78 kg (01/26 0500) Last BM Date : 12/25/23    Weight change: Filed Weights   12/23/23 0434 12/25/23 0400 12/26/23 0500  Weight: 77.1 kg 79.5 kg 78 kg   Intake/Output:   Intake/Output Summary (Last 24 hours) at 12/26/2023 1031 Last data filed at 12/26/2023 1000 Gross per 24 hour  Intake 2173.18 ml  Output 2286.6 ml  Net -113.42 ml    Physical Exam   General:  Sitting up in bed. No resp difficulty HEENT: normal Neck: supple. + RIJ HD  Cor: Regular rate & rhythm. No rubs, gallops or murmurs. Lungs: clear Abdomen: soft, nontender, nondistended. No hepatosplenomegaly. No bruits or masses. Good bowel sounds. Extremities: no cyanosis, clubbing, rash, edema Neuro: alert & orientedx3, cranial nerves  grossly intact. moves all 4 extremities w/o difficulty. Affect pleasant  Telemetry   NSR 70-80, personally reviewed.  Labs    CBC Recent Labs    12/25/23 0414 12/26/23 0348  WBC 8.5 7.9  HGB 7.5* 7.3*  HCT 25.1* 24.4*  MCV 109.6* 108.4*  PLT 118* 124*   Basic Metabolic Panel Recent Labs    40/98/11 0414 12/25/23 1536 12/26/23 0348  NA 134* 136 135  K 4.4 4.6 4.5  CL 103 102 103  CO2 26 27 27   GLUCOSE 143* 136* 129*  BUN 13 13 15   CREATININE 1.05* 0.99 1.25*  CALCIUM 7.9* 8.3* 8.1*  MG 2.5*  --  2.6*  PHOS 2.2* 1.8* 2.3*   Liver Function Tests Recent Labs    12/25/23 1536 12/26/23 0348  ALBUMIN 2.3* 2.2*   No results for input(s): "LIPASE", "AMYLASE" in the last 72 hours. Cardiac Enzymes No results for input(s): "CKTOTAL", "CKMB", "CKMBINDEX", "TROPONINI" in the last 72 hours.  BNP: BNP (last 3 results) Recent Labs    05/26/23 1610 08/26/23 1103 09/22/23 0905  BNP 778.0* 781.0* 926.0*   Other results:  Imaging   No results found.   Medications:   Scheduled Medications:  amiodarone  200 mg Oral BID   ascorbic acid  250 mg Per Tube BID   Chlorhexidine Gluconate Cloth  6 each Topical Daily   [START ON 12/28/2023] darbepoetin (ARANESP) injection -  NON-DIALYSIS  100 mcg Subcutaneous Q Tue-1800   droxidopa  200 mg Per Tube TID WC   feeding supplement (PROSource TF20)  60 mL Per Tube BID   fiber supplement (BANATROL TF)  60 mL Per Tube QID   Gerhardt's butt cream   Topical BID   influenza vaccine adjuvanted  0.5 mL Intramuscular Tomorrow-1000   insulin aspart  0-24 Units Subcutaneous Q4H   midodrine  20 mg Per Tube Q8H   mirtazapine  7.5 mg Oral QHS   multivitamin  1 tablet Per Tube BID   QUEtiapine  25 mg Per Tube QHS   sertraline  25 mg Per Tube Daily   Warfarin - Pharmacist Dosing Inpatient   Does not apply q1600   zinc sulfate (50mg  elemental zinc)  220 mg Per Tube Daily    Infusions:   prismasol BGK 4/2.5 400 mL/hr at 12/26/23 0130     prismasol BGK 4/2.5 400 mL/hr at 12/26/23 0129   sodium chloride 1 mL/hr at 12/26/23 1000   anticoagulant sodium citrate     feeding supplement (VITAL 1.5 CAL) 40 mL/hr at 12/26/23 1000   heparin 1,050 Units/hr (12/26/23 1000)   norepinephrine (LEVOPHED) Adult infusion 2 mcg/min (12/26/23 1000)   prismasol BGK 4/2.5 1,500 mL/hr at 12/26/23 1610    PRN Medications: sodium chloride, alteplase, anticoagulant sodium citrate, artificial tears, bisacodyl **OR** bisacodyl, heparin, heparin, HYDROmorphone (DILAUDID) injection, levalbuterol, lidocaine (PF), lidocaine-prilocaine, lip balm, melatonin, ondansetron (ZOFRAN) IV, mouth rinse, oxyCODONE, pentafluoroprop-tetrafluoroeth, pneumococcal 20-valent conjugate vaccine, sodium chloride, traZODone  Patient Profile   Vanessa [name] is a 73 y.o. with history of nonischemic cardiomyopathy and severe MR s/p Mitral Clip x2 (2020), pHTN, and CKD IIIb.   Assessment/Plan   1. Post Cardiotomy Cardiogenic & septic shock: Bioprosthetic MVR and tricuspid ring 11/29/23 with Dr. Leafy Ro. Pre-op cath with no significant CAD.  Pre-op echo with EF 40-45%, severe MR. Went back to the OR with post-op tamponade for clean-out.  Echo 12/31 showed EF 25-30%, moderate RV dysfunction, s/p bioprosthetic mitral valve replacement with trivial MR and tricuspid valve repair with trivial TR. Worsened on 1/2 and re-intubated in setting of volume overload, oliguric renal failure &  pulmonary hypertension. Extubated on 12/04/22. 1/13 CT C/A/P  moderate hemorrhagic pericardial effusion and 5.3 cm hematoma right heart border.  Repeat echo no tamponade. Required CRRT for volume removal.  CVVH stopped 1/12 then restarted 01/14 for volume removal.  - Post -op echo 12/14/23 EF 35-40% RV mod reduced. Valves stable - RHC 1/17 showed RA 3 PA 47/20 (30) PCWP 5 PAPi 9 CI 1.74.  - c/w vasopalegia. Continues with vasoplegia. - Co-ox 75% - Today on NE at 2, high dose midodrine + droxidopa. Methylene  blue given 1/22.  - Continue to wean NE as tolerated. Have dropped SBP goal to 85 .  - continue compression hose - Remains anuric. CVP 7-8  On CRRT, keep even to slightly negative 2. Mitral regurgitation: s/p Mitraclip x 2 1/20. Echo 9/24 showed MV s/p Mitraclip x 2 with mean gradient 6 mmHg and severe mitral regurgitation.  She was not thought to be a candidate for an additional Mitraclip due to lack of room between the two existing clips. S/p MV replacement and Tricuspid repair on 11/29/23 with Dr. Leafy Ro.  - Last echo stable valves.  3. Ventricular ectopy:  - Quiescent now  -  Continue amiodarone 200 mg bid  4. Pulmonary hypertension: Pre-op RHC (11/24) with PVR 4.5, with moderate mixed pulmonary arterial and pulmonary  venous hypertension likely due to mitral valve regurgitation. Now s/p MVR, PA pressure severely elevated post-op.  Suspect still mixed PAH/PVH with elevated PCWP.  NO was tried at 10 ppm but did not have much effect so now off.  Will hold off on pulmonary vasodilators; small clinical trials demonstrate no improvement and some markers of harm with PDE5i in PH in the setting of mitral valve disease. RHC 1/17 with mild pulmonary arterial hypertension.  5. 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 TSH was normal in 8/24. Follows with Endocrinology 6. AKI on CKD stage 3:  1/14 CRRT restarted. CRRT stopped 1/20 and restarted 1/22. Remains anuric.  - b/l Scr 1.6-1.7 - Remains anuric. CVP 7-8  On CRRT, keep even to slightly negative - tunneled cath in place - Nephrology following.   7. Blood loss anemia - Hgb 7.3, monitor closely  - suspect shw would benefit from 1uRBCs  8. Complete heart block: She had post-op CHB. Resolved.  9. Hypoxic / Hypercapnic respiratory failure: Reintubated 01/02.  Extubated 01/05.  -Now on room air with stable sats.  10. Paroxsymal Atrial fibrillation:  - In SR on po amio.   - On Heparin drip. Pharmacy loading warfarin . INR 1.1 11. ID:  Suspected septic shock component, no definite source. All lines changed out last week and started on linezolid/meropenem, now just on meropenem.  Afebrile. Leukocytosis resolved  - Meropenem stopped 1/25 12. Thrombocytopenia: Suspect due to sepsis. Platelets trending back up 124K today.  13. Deconditioning - Continue PT 14. Protein-calorie malnutrition - continue TFs via Cor-trak  Continues to struggle with refractory hypotension/vasoplegia requiring NE. As it looks like there will be no renal recovery, can drop SBP goal to 85 as long as other end-organ function stable and she is not symptomatic. I worry she will not be able to tolerate iHD. I discussed OC with her this weekend and wants to pursue all aggressive interventions   CRITICAL CARE Performed by: Arvilla Meres  Total critical care time: 35  minutes  Critical care time was exclusive of separately billable procedures and treating other patients.  Critical care was necessary to treat or prevent imminent or life-threatening deterioration.  Critical care was time spent personally by me (independent of midlevel providers or residents) on the following activities: development of treatment plan with patient and/or surrogate as well as nursing, discussions with consultants, evaluation of patient's response to treatment, examination of patient, obtaining history from patient or surrogate, ordering and performing treatments and interventions, ordering and review of laboratory studies, ordering and review of radiographic studies, pulse oximetry and re-evaluation of patient's condition.     Arvilla Meres, MD  10:31 AM

## 2023-12-26 NOTE — Plan of Care (Signed)
?  Problem: Clinical Measurements: ?Goal: Will remain free from infection ?Outcome: Progressing ?  ?

## 2023-12-27 ENCOUNTER — Telehealth (HOSPITAL_COMMUNITY): Payer: Self-pay | Admitting: Pharmacy Technician

## 2023-12-27 ENCOUNTER — Encounter (HOSPITAL_COMMUNITY): Payer: PPO

## 2023-12-27 ENCOUNTER — Other Ambulatory Visit (HOSPITAL_COMMUNITY): Payer: Self-pay

## 2023-12-27 DIAGNOSIS — R739 Hyperglycemia, unspecified: Secondary | ICD-10-CM | POA: Diagnosis not present

## 2023-12-27 DIAGNOSIS — R570 Cardiogenic shock: Secondary | ICD-10-CM | POA: Diagnosis not present

## 2023-12-27 DIAGNOSIS — R110 Nausea: Secondary | ICD-10-CM

## 2023-12-27 DIAGNOSIS — I959 Hypotension, unspecified: Secondary | ICD-10-CM | POA: Diagnosis not present

## 2023-12-27 DIAGNOSIS — N179 Acute kidney failure, unspecified: Secondary | ICD-10-CM | POA: Diagnosis not present

## 2023-12-27 DIAGNOSIS — Z952 Presence of prosthetic heart valve: Secondary | ICD-10-CM | POA: Diagnosis not present

## 2023-12-27 LAB — CBC
HCT: 24.5 % — ABNORMAL LOW (ref 36.0–46.0)
HCT: 27.7 % — ABNORMAL LOW (ref 36.0–46.0)
Hemoglobin: 7.2 g/dL — ABNORMAL LOW (ref 12.0–15.0)
Hemoglobin: 8.4 g/dL — ABNORMAL LOW (ref 12.0–15.0)
MCH: 32.3 pg (ref 26.0–34.0)
MCH: 31.9 pg (ref 26.0–34.0)
MCHC: 29.4 g/dL — ABNORMAL LOW (ref 30.0–36.0)
MCHC: 30.3 g/dL (ref 30.0–36.0)
MCV: 109.9 fL — ABNORMAL HIGH (ref 80.0–100.0)
MCV: 105.3 fL — ABNORMAL HIGH (ref 80.0–100.0)
Platelets: 144 10*3/uL — ABNORMAL LOW (ref 150–400)
Platelets: 147 10*3/uL — ABNORMAL LOW (ref 150–400)
RBC: 2.23 MIL/uL — ABNORMAL LOW (ref 3.87–5.11)
RBC: 2.63 MIL/uL — ABNORMAL LOW (ref 3.87–5.11)
RDW: 23.9 % — ABNORMAL HIGH (ref 11.5–15.5)
RDW: 24.5 % — ABNORMAL HIGH (ref 11.5–15.5)
WBC: 6.3 10*3/uL (ref 4.0–10.5)
WBC: 8.2 10*3/uL (ref 4.0–10.5)
nRBC: 0.8 % — ABNORMAL HIGH (ref 0.0–0.2)
nRBC: 0.9 % — ABNORMAL HIGH (ref 0.0–0.2)

## 2023-12-27 LAB — RENAL FUNCTION PANEL
Albumin: 2.2 g/dL — ABNORMAL LOW (ref 3.5–5.0)
Albumin: 2.4 g/dL — ABNORMAL LOW (ref 3.5–5.0)
Anion gap: 7 (ref 5–15)
Anion gap: 8 (ref 5–15)
BUN: 16 mg/dL (ref 8–23)
BUN: 18 mg/dL (ref 8–23)
CO2: 26 mmol/L (ref 22–32)
CO2: 25 mmol/L (ref 22–32)
Calcium: 8.1 mg/dL — ABNORMAL LOW (ref 8.9–10.3)
Calcium: 8.1 mg/dL — ABNORMAL LOW (ref 8.9–10.3)
Chloride: 100 mmol/L (ref 98–111)
Chloride: 102 mmol/L (ref 98–111)
Creatinine, Ser: 1.03 mg/dL — ABNORMAL HIGH (ref 0.44–1.00)
Creatinine, Ser: 1.02 mg/dL — ABNORMAL HIGH (ref 0.44–1.00)
GFR, Estimated: 58 mL/min — ABNORMAL LOW (ref >60)
GFR, Estimated: 58 mL/min — ABNORMAL LOW (ref >60)
Glucose, Bld: 132 mg/dL — ABNORMAL HIGH (ref 70–99)
Glucose, Bld: 120 mg/dL — ABNORMAL HIGH (ref 70–99)
Phosphorus: 2.2 mg/dL — ABNORMAL LOW (ref 2.5–4.6)
Phosphorus: 2.3 mg/dL — ABNORMAL LOW (ref 2.5–4.6)
Potassium: 4.6 mmol/L (ref 3.5–5.1)
Potassium: 5 mmol/L (ref 3.5–5.1)
Sodium: 133 mmol/L — ABNORMAL LOW (ref 135–145)
Sodium: 135 mmol/L (ref 135–145)

## 2023-12-27 LAB — COOXEMETRY PANEL
Carboxyhemoglobin: 2.2 % — ABNORMAL HIGH (ref 0.5–1.5)
Methemoglobin: 1.1 % (ref 0.0–1.5)
O2 Saturation: 70.3 %
Total hemoglobin: 7.8 g/dL — ABNORMAL LOW (ref 12.0–16.0)

## 2023-12-27 LAB — CORTISOL: Cortisol, Plasma: 18.4 ug/dL

## 2023-12-27 LAB — PROTIME-INR
INR: 1.2 (ref 0.8–1.2)
Prothrombin Time: 15.5 s — ABNORMAL HIGH (ref 11.4–15.2)

## 2023-12-27 LAB — GLUCOSE, CAPILLARY
Glucose-Capillary: 132 mg/dL — ABNORMAL HIGH (ref 70–99)
Glucose-Capillary: 124 mg/dL — ABNORMAL HIGH (ref 70–99)
Glucose-Capillary: 137 mg/dL — ABNORMAL HIGH (ref 70–99)
Glucose-Capillary: 112 mg/dL — ABNORMAL HIGH (ref 70–99)
Glucose-Capillary: 119 mg/dL — ABNORMAL HIGH (ref 70–99)

## 2023-12-27 LAB — PREPARE RBC (CROSSMATCH)

## 2023-12-27 LAB — MAGNESIUM: Magnesium: 2.7 mg/dL — ABNORMAL HIGH (ref 1.7–2.4)

## 2023-12-27 LAB — HEPARIN LEVEL (UNFRACTIONATED): Heparin Unfractionated: 0.61 [IU]/mL (ref 0.30–0.70)

## 2023-12-27 MED ORDER — K PHOS MONO-SOD PHOS DI & MONO 155-852-130 MG PO TABS
500.0000 mg | ORAL_TABLET | Freq: Once | ORAL | Status: AC
  Administered 2023-12-27: 500 mg via ORAL
  Filled 2023-12-27: qty 2

## 2023-12-27 MED ORDER — WARFARIN SODIUM 5 MG PO TABS
5.0000 mg | ORAL_TABLET | Freq: Once | ORAL | Status: AC
  Administered 2023-12-27: 5 mg via ORAL
  Filled 2023-12-27: qty 1

## 2023-12-27 MED ORDER — PRISMASOL BGK 0/2.5 32-2.5 MEQ/L EC SOLN
Status: DC
  Filled 2023-12-27 (×25): qty 5000

## 2023-12-27 MED ORDER — SODIUM PHOSPHATES 45 MMOLE/15ML IV SOLN
15.0000 mmol | Freq: Once | INTRAVENOUS | Status: DC
  Filled 2023-12-27: qty 5

## 2023-12-27 MED ORDER — SODIUM CHLORIDE 0.9% IV SOLUTION: Freq: Once | INTRAVENOUS | Status: AC

## 2023-12-27 MED ORDER — METOCLOPRAMIDE HCL 5 MG/ML IJ SOLN
5.0000 mg | Freq: Three times a day (TID) | INTRAMUSCULAR | Status: DC
  Administered 2023-12-27 – 2024-01-01 (×15): 5 mg via INTRAVENOUS
  Filled 2023-12-27 (×15): qty 2

## 2023-12-27 MED ORDER — SODIUM PHOSPHATES 45 MMOLE/15ML IV SOLN
15.0000 mmol | Freq: Once | INTRAVENOUS | Status: AC
  Administered 2023-12-27: 15 mmol via INTRAVENOUS
  Filled 2023-12-27: qty 5

## 2023-12-27 NOTE — Telephone Encounter (Signed)
Pharmacy Patient Advocate Encounter   Received notification that prior authorization for Droxidopa 200MG  capsulesis required/requested.   Insurance verification completed.   The patient is insured through [facility] ADVANTAGE/RX ADVANCE .   Per test claim: PA required; PA submitted to above mentioned insurance via CoverMyMeds Key/confirmation #/EOC [facility] Status is pending

## 2023-12-27 NOTE — Procedures (Signed)
I was present at this CRRT session. I have reviewed the session itself and made appropriate changes.   Low phosphorus and will replete and follow.   To receive blood today.   Vital signs in last 24 hours:  Temp:  [97.1 F (36.2 C)-98.8 F (37.1 C)] 97.1 F (36.2 C) (01/27 0700) Pulse Rate:  [71-85] 78 (01/27 0915) Resp:  [10-67] 36 (01/27 0915) BP: (77-122)/(47-80) 102/60 (01/27 0915) SpO2:  [93 %-100 %] 95 % (01/27 0915) Weight:  [74.3 kg] 74.3 kg (01/27 0435) Weight change: -3.7 kg Filed Weights   12/25/23 0400 12/26/23 0500 12/27/23 0435  Weight: 79.5 kg 78 kg 74.3 kg    Recent Labs  Lab 12/27/23 0438  NA 133*  K 4.6  CL 100  CO2 26  GLUCOSE 132*  BUN 16  CREATININE 1.03*  CALCIUM 8.1*  PHOS 2.2*    Recent Labs  Lab 12/25/23 0414 12/26/23 0348 12/27/23 0438  WBC 8.5 7.9 6.3  HGB 7.5* 7.3* 7.2*  HCT 25.1* 24.4* 24.5*  MCV 109.6* 108.4* 109.9*  PLT 118* 124* 144*    Scheduled Meds:  sodium chloride   Intravenous Once   amiodarone  200 mg Oral BID   ascorbic acid  250 mg Per Tube BID   Chlorhexidine Gluconate Cloth  6 each Topical Daily   [START ON 12/28/2023] darbepoetin (ARANESP) injection - NON-DIALYSIS  100 mcg Subcutaneous Q Tue-1800   droxidopa  200 mg Per Tube TID WC   feeding supplement (PROSource TF20)  60 mL Per Tube BID   fiber supplement (BANATROL TF)  60 mL Per Tube QID   Gerhardt's butt cream   Topical BID   influenza vaccine adjuvanted  0.5 mL Intramuscular Tomorrow-1000   insulin aspart  0-24 Units Subcutaneous Q4H   midodrine  20 mg Per Tube Q8H   mirtazapine  7.5 mg Oral QHS   multivitamin  1 tablet Per Tube BID   QUEtiapine  25 mg Per Tube QHS   sertraline  25 mg Per Tube Daily   warfarin  5 mg Oral ONCE-1600   Warfarin - Pharmacist Dosing Inpatient   Does not apply q1600   zinc sulfate (50mg  elemental zinc)  220 mg Per Tube Daily   Continuous Infusions:   prismasol BGK 4/2.5 400 mL/hr at 12/27/23 0250    prismasol BGK 4/2.5 400  mL/hr at 12/27/23 0251   sodium chloride Stopped (12/26/23 1005)   anticoagulant sodium citrate     feeding supplement (VITAL 1.5 CAL) 40 mL/hr at 12/27/23 0900   heparin 1,050 Units/hr (12/27/23 0900)   prismasol BGK 4/2.5 1,500 mL/hr at 12/27/23 0606   PRN Meds:.sodium chloride, alteplase, anticoagulant sodium citrate, artificial tears, bisacodyl **OR** bisacodyl, heparin, heparin, HYDROmorphone (DILAUDID) injection, levalbuterol, lidocaine (PF), lidocaine-prilocaine, lip balm, melatonin, ondansetron (ZOFRAN) IV, mouth rinse, oxyCODONE, pentafluoroprop-tetrafluoroeth, [START ON 12/31/2023] pneumococcal 20-valent conjugate vaccine, sodium chloride, traZODone   Irena Cords,  MD 12/27/2023, 9:21 AM

## 2023-12-27 NOTE — Progress Notes (Signed)
Patient ID: Vanessa [name], female   DOB: 1951-10-10, 73 y.o.   MRN: [id number]      Advanced Heart Failure Rounding Note  Cardiologist: Dr. Shirlee Latch  Chief Complaint: Cardiogenic Shock  Subjective:   12/30 S/P MVR (tissue) and TV ring and same day return to OR for tamponade/bleeding. 1/2: Progressive volume overload with poor UOP, CVVH started but ended up re-intubated.   Extubated 12/05/23 1/12: CVVH stopped 1/13: iHD, HD line removed 1/14: CRRT Restarted.  1/17: RHC mean RA 2, PA 47/20, mean PCWP 5, CI 1.74, PVR 8, PAPi 9.  Milrinone 0.125 started. 1/20 CRRT stopped.  1/22 methylene blue for persistent vasoplegia  1/24 tunneled HD cath placed  Off NE. On midodrine 20 + droxidopa. SBP 80-100s. Co-ox 70. CVP 5  Feeling fine this morning. Slept well overnight. No CP, SOB or nausea.   Objective:    Weight Range: 74.3 kg Body mass index is 29.02 kg/m.   Vital Signs:   Temp:  [97.7 F (36.5 C)-98.8 F (37.1 C)] 98 F (36.7 C) (01/27 0500) Pulse Rate:  [71-85] 75 (01/27 0645) Resp:  [10-67] 29 (01/27 0645) BP: (77-122)/(47-80) 100/64 (01/27 0645) SpO2:  [93 %-100 %] 99 % (01/27 0645) Weight:  [74.3 kg] 74.3 kg (01/27 0435) Last BM Date : 12/26/23  Weight change: Filed Weights   12/25/23 0400 12/26/23 0500 12/27/23 0435  Weight: 79.5 kg 78 kg 74.3 kg   Intake/Output:   Intake/Output Summary (Last 24 hours) at 12/27/2023 0706 Last data filed at 12/27/2023 0700 Gross per 24 hour  Intake 1657.59 ml  Output 1888 ml  Net -230.41 ml    Physical Exam   CVP 5 General: Weak appearing. No distress on [place] HEENT: neck supple.   Cardiac: JVP not visible. S1 and S2 present. No murmurs or rub. Resp: Lung sounds clear, bases diminished Abdomen: Soft, non-tender, non-distended. + BS. Extremities: Warm and dry. No rash, cyanosis. Trace b/l ankle edema.  Neuro: Alert and oriented x3. Affect pleasant. Moves all extremities without difficulty. Lines/Devices:  LIJ CVC, RIJ HDL,  CorTrak  Telemetry   NSR in 70s (personally reviewed)  Labs    CBC Recent Labs    12/26/23 0348 12/27/23 0438  WBC 7.9 6.3  HGB 7.3* 7.2*  HCT 24.4* 24.5*  MCV 108.4* 109.9*  PLT 124* 144*   Basic Metabolic Panel Recent Labs    82/95/62 0348 12/26/23 1505 12/27/23 0438  NA 135 136 133*  K 4.5 4.9 4.6  CL 103 102 100  CO2 27 26 26   GLUCOSE 129* 109* 132*  BUN 15 17 16   CREATININE 1.25* 1.27* 1.03*  CALCIUM 8.1* 8.3* 8.1*  MG 2.6*  --  2.7*  PHOS 2.3* 2.4* 2.2*   Liver Function Tests Recent Labs    12/26/23 1505 12/27/23 0438  ALBUMIN 2.3* 2.2*   No results for input(s): "LIPASE", "AMYLASE" in the last 72 hours. Cardiac Enzymes No results for input(s): "CKTOTAL", "CKMB", "CKMBINDEX", "TROPONINI" in the last 72 hours.  BNP: BNP (last 3 results) Recent Labs    05/26/23 1610 08/26/23 1103 09/22/23 0905  BNP 778.0* 781.0* 926.0*   Other results:  Imaging   No results found.  Medications:    Scheduled Medications:  amiodarone  200 mg Oral BID   ascorbic acid  250 mg Per Tube BID   Chlorhexidine Gluconate Cloth  6 each Topical Daily   [START ON 12/28/2023] darbepoetin (ARANESP) injection - NON-DIALYSIS  100 mcg Subcutaneous Q Tue-1800   droxidopa  200 mg Per Tube TID WC   feeding supplement (PROSource TF20)  60 mL Per Tube BID   fiber supplement (BANATROL TF)  60 mL Per Tube QID   Gerhardt's butt cream   Topical BID   influenza vaccine adjuvanted  0.5 mL Intramuscular Tomorrow-1000   insulin aspart  0-24 Units Subcutaneous Q4H   midodrine  20 mg Per Tube Q8H   mirtazapine  7.5 mg Oral QHS   multivitamin  1 tablet Per Tube BID   QUEtiapine  25 mg Per Tube QHS   sertraline  25 mg Per Tube Daily   Warfarin - Pharmacist Dosing Inpatient   Does not apply q1600   zinc sulfate (50mg  elemental zinc)  220 mg Per Tube Daily    Infusions:   prismasol BGK 4/2.5 400 mL/hr at 12/27/23 0250    prismasol BGK 4/2.5 400 mL/hr at 12/27/23 0251   sodium  chloride Stopped (12/26/23 1005)   anticoagulant sodium citrate     feeding supplement (VITAL 1.5 CAL) 40 mL/hr at 12/27/23 0700   heparin 1,050 Units/hr (12/27/23 0700)   norepinephrine (LEVOPHED) Adult infusion Stopped (12/26/23 1137)   prismasol BGK 4/2.5 1,500 mL/hr at 12/27/23 0606    PRN Medications: sodium chloride, alteplase, anticoagulant sodium citrate, artificial tears, bisacodyl **OR** bisacodyl, heparin, heparin, HYDROmorphone (DILAUDID) injection, levalbuterol, lidocaine (PF), lidocaine-prilocaine, lip balm, melatonin, ondansetron (ZOFRAN) IV, mouth rinse, oxyCODONE, pentafluoroprop-tetrafluoroeth, [START ON 12/31/2023] pneumococcal 20-valent conjugate vaccine, sodium chloride, traZODone  Patient Profile   Vanessa [name] is a 73 y.o. with history of nonischemic cardiomyopathy and severe MR s/p Mitral Clip x2 (2020), pHTN, and CKD IIIb.   Assessment/Plan   1. Post Cardiotomy Cardiogenic & septic shock: Bioprosthetic MVR and tricuspid ring 11/29/23 with Dr. Leafy Ro. Pre-op cath with no significant CAD.  Pre-op echo with EF 40-45%, severe MR. Went back to the OR with post-op tamponade for clean-out.  Echo 12/31 showed EF 25-30%, moderate RV dysfunction, s/p bioprosthetic mitral valve replacement with trivial MR and tricuspid valve repair with trivial TR. Worsened on 1/2 and re-intubated in setting of volume overload, oliguric renal failure &  pulmonary hypertension. Extubated on 12/04/22. 1/13 CT C/A/P  moderate hemorrhagic pericardial effusion and 5.3 cm hematoma right heart border.  Repeat echo no tamponade. Required CRRT for volume removal.  CVVH stopped 1/12 then restarted 01/14 for volume removal.  - Post -op echo 12/14/23 EF 35-40% RV mod reduced. Valves stable - RHC 1/17 showed RA 3 PA 47/20 (30) PCWP 5 PAPi 9 CI 1.74 - c/w vasoplegia. Continues with vasoplegia. - Co-ox 70.  - Off NE. High dose midodrine + droxidopa. Methylene blue given 1/22.  - Continue compression hose -  Remains anuric. CVP 5  On CRRT, keep even. 2. Mitral regurgitation: s/p Mitraclip x 2 1/20. Echo 9/24 showed MV s/p Mitraclip x 2 with mean gradient 6 mmHg and severe mitral regurgitation.  She was not thought to be a candidate for an additional Mitraclip due to lack of room between the two existing clips. S/p MV replacement and Tricuspid repair on 11/29/23 with Dr. Leafy Ro.  - Last echo stable valves.  3. Ventricular ectopy:  - Quiescent now  - Continue amiodarone 200 mg bid  4. Pulmonary hypertension: Pre-op RHC (11/24) with PVR 4.5, with moderate mixed pulmonary arterial and pulmonary venous hypertension likely due to mitral valve regurgitation. Now s/p MVR, PA pressure severely elevated post-op.  Suspect still mixed PAH/PVH with elevated PCWP.  NO was tried at 10 ppm but did  not have much effect so now off.  Will hold off on pulmonary vasodilators; small clinical trials demonstrate no improvement and some markers of harm with PDE5i in PH in the setting of mitral valve disease. RHC 1/17 with mild pulmonary arterial hypertension.  5. 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 TSH was normal in 8/24. Follows with Endocrinology 6. AKI on CKD stage 3:  1/14 CRRT restarted. CRRT stopped 1/20 and restarted 1/22. Remains anuric.  - b/l Scr 1.6-1.7 - Remains anuric. CVP 5. TDC in place. On CRRT, keep even. - Nephrology following.   7. Blood loss anemia - Hgb 7.2, monitor closely  - suspect she would benefit from [facility] today. 8. Complete heart block: She had post-op CHB. Resolved.  9. Hypoxic / Hypercapnic respiratory failure: Reintubated 01/02.  Extubated 01/05.  - Now on RA to 2L [place] with stable sats.  10. Paroxsymal Atrial fibrillation:  - In SR on po amio.   - On Heparin drip. Pharmacy loading warfarin . INR 1.2 11. ID: Suspected septic shock component, no definite source. All lines changed out last week and started on linezolid/meropenem, now just on meropenem.  Afebrile. Leukocytosis  resolved  - Meropenem stopped 1/25 12. Thrombocytopenia: Suspect due to sepsis. Platelets trending back up 144K today.  13. Deconditioning - Continue PT 14. Protein-calorie malnutrition - continue TFs via Cor-trak  [place] Radley Barto, NP 12/27/23  Advanced Heart Failure Team Pager [phone] (M-F; 7a - 5p)  Please contact [facility] for night-coverage after hours (4p -7a ) and weekends on [email or web address]

## 2023-12-27 NOTE — Progress Notes (Addendum)
NAME:  Vanessa [name], MRN:  [id number], DOB:  [birth year], LOS: 28 ADMISSION DATE:  11/29/2023, CONSULTATION DATE:  11/29/23 REFERRING MD:  Dr. Leafy Ro, CHIEF COMPLAINT:  postop   History of Present Illness:  72yoF with PMH significant for HTN, NICM, HFrEF (EF40-45%), PHTN, CKD and hx of prior severe MR s/p prior mitral clip x 2 in 2020.  Pt with progressive DOE found to have increasing MR  and moderate TR despite adjustments in GDMT.  Not felt to be candidate for additional mitral clip.  Repeat LHC without CAD but showed moderate PHTN.  Underwent mitral valve replacement with tissue valve and tricuspid valve repair by Dr. Leafy Ro on 12/30.  PCCM consulted to assist with vent and medical management post-operatively in ICU.   Pertinent  Medical History  Never smoker, MR w/ previous Mitral clip x 2 (2020), NICM, HFrEF, PHTN, HTN, CKD3b  Significant Hospital Events: Including procedures, antibiotic start and stop dates in addition to other pertinent events   MVR by Dr. Leafy Ro; relook for tamponade/washout later in evening without obvious source 1/2 intubated for respiratory decompensation, iNO started. Vanc, meropenem started. CRRT started.  1/5 extubated 1/8 converted to NSR overnight 1/9 back to A.fib around 0200 1/11 R introducer out.  Unable to thread right subclavian catheter 1/12 remains weak still pressor dependent 1/13 repeat [facility] sent. Still on pressors..,  Zosyn changed to meropenem.  Chest abdomen and pelvis CT obtained : Moderate hemorrhagic pericardial effusion, 5.3 cm hematoma right thrombus. Arterial gas bilateral patchy opacities mild dependent posterior right upper lobe atelectasis was unremarkable layering gallstones; neg for tamponade on repeat echo 1/14 new right IJ HD catheter placed.  The echocardiogram Was obtained to better evaluate her EF is 35 to 40%.  The LV demonstrates regional wall motion abnormalities there is asymmetric left ventricular hypertrophy of the inferior  lateral segment RV systolic function moderate regular reduced left atrial wall dilated no mention of clot.  1/15 still on fairly high-dose norepinephrine 1/16 cont CRRT + low dose NE  1/17 RHC confirms low output state, restarted on milrinone 1/20 CRRT stopped 1/21: dc milrinone yesterday, ongoing increase NE requirements during nights. Started droxidopa in addition to midodrine to aid off NE.  1/22: levo up to 18 overnight (max so far), cvp 9, restarting crrt today. Received dose methylene blue, Cortisol 21 1/23 Levo down to 9, CVP 8 1/24 tunneled HD and tunneled dual lumen LIJ CVC placed  1/27 1 unit PRBC given for Hgb 7.2 and hypotension.   Interim History / Subjective:  NE now off > SBP marginal.   Feels OK. Nausea improved. No complaints this morning  Objective   Blood pressure 100/64, pulse 75, temperature (!) 97.1 F (36.2 C), temperature source Axillary, resp. rate (!) 29, height 5\' 3"  (1.6 m), weight 74.3 kg, SpO2 99%. CVP:  [3 mmHg-11 mmHg] 7 mmHg      Intake/Output Summary (Last 24 hours) at 12/27/2023 0745 Last data filed at 12/27/2023 0700 Gross per 24 hour  Intake 1657.59 ml  Output 1888 ml  Net -230.41 ml   Filed Weights   12/25/23 0400 12/26/23 0500 12/27/23 0435  Weight: 79.5 kg 78 kg 74.3 kg   Examination:   General:  elderly appearing female in no acute distress HEENT: [place]/AT, PERRL, unable to appreciate JVD Neuro: Alert, oriented, non-focal.  CV: RRR, no MRG.  PULM:  Shallow respirations. Diminished bases. Satting well on 2L.  GI: Soft, NT, ND normoactive.  Extremities: TED stockings.  Skin: Grossly  intact   Anuric Afebrile CVP 5-7 Coox 70 CRRT keeping even Wts 77.1> 79.5> 78 > 74.3 kg -237ml/ 24hrs Imaging/ micro reviewed  Creat 1.0 BUN 16 Phos 2.2 Mg 2.7 Hgb 7.2  Resolved Hospital Problem list :   Postoperative vent management Postop tamponade resolved after relook and washout on day 1  Assessment & Plan:   Sev mitral regurg s/p  prior mitral clip, now s/p MVR Moderate tricuspid regurgitation s/p TV repair Persistent cardioplegia postoperatively- confirmed on RHC pHTN AoC HFrEF NICM pAF, flutter  S/p MVR and tricuspid ring 12/30 w/ [place]. Back to OR for tamponade. Decompensated requiring intubation for FVO, renal failure, pHTN. 1/17 RHC with low filling pressures and started on milrinone, subsequently off. s/p methylene blue 1/22 P:  - per AHF,  greatly appreciate assistance  - Continue amiodarone 200mg  BID - heparin gtt with warfarin bridge started 1/15, INR 1.2 - cont to wean NE for SBP 85. Off this morning.  - cont midodrine 20mg  TID, droxidopa 200mg  TID,  - TED hose - trending CVP and coox  Acute renal failure on CKD III, on CRRT 1/14 CRRT started, stopped 1/20., restarted 1/22  - CRRT per nephrology - Remains anuric - renal indices BID - renally dose meds, avoid nephrotoxic meds - strict I/O - Electrolytes per nephro.   R/o Sepsis,  unclear source -  pan scan 1/13 with patchy lower lobe opacities, no AP findings.  No definite source. Lines were replaced 1/24.  Off meropenem since 1/25.  BC and UC neg 1/13, - remains afebrile with normal WBC, improving thrombocytopenia.  Monitor clinically off abx  Protein calorie malnutrition, severe Early satiety - Continue TF via cortrak. Poor appetite. Nocturnal feeds and lower Kcal did not improve appetite.  - mirtazapine add 1/25 to hopefully help appetite, monitor for oversedation on seroquel - Start reglan  Acute on chronic anemia Thrombocytopenia, potentially due to antibiotics, doubt sepsis, improving - Hgb drifting down. 7.2 today - Transfusing one unit PRBC per HF.   Physical deconditioning  - PT, OT, mobility remains limited with CRRT  Deep tissue injury on left buttocks approximately 2 cm in diameter purple discoloration - WOC, turns, maximize nutrition   Situational depression Sleep disturbance - con't melatonin prn, trazodone prn,   seroquel 25mg  qHS, mirtazepine 7.5mg  qhs - zoloft 25mg  daily   Hyperglycemia/ prediabetes - A1c 6 11/25/2023 - SSI prn, CBG q4  Best Practice (right click and "Reselect all SmartList Selections" daily)   Diet/type: Encourage PO, no appetite, supplement TF DVT prophylaxis systemic heparin/ warfarin Pressure ulcer(s): L buttocks  GI prophylaxis: N/A Lines: HD cath 1/24, tunneled CVC 1/24 Foley:  N/A Code Status:  full code Last date of multidisciplinary goals of care discussion [per primary]  CCT: 32 min   Joneen Roach, AGACNP-BC [facility]  See Amion for personal pager PCCM on call pager [phone] until 7pm. Please call Elink 7p-7a. [phone]  12/27/2023 7:47 AM

## 2023-12-27 NOTE — Telephone Encounter (Signed)
Pharmacy Patient Advocate Encounter  Received notification from [facility] [employer] that Prior Authorization for Droxidopa 200MG  capsules  has been DENIED.  Full denial letter will be uploaded to the media tab. See denial reason below.   PA #/Case ID/Reference #: [id number]

## 2023-12-27 NOTE — Progress Notes (Signed)
PHARMACY - ANTICOAGULATION CONSULT NOTE  Pharmacy Consult for IV heparin + warfarin Indication: atrial fibrillation  No Known Allergies  Patient Measurements: Height: 5\' 3"  (160 cm) Weight: 74.3 kg (163 lb 12.8 oz) IBW/kg (Calculated) : 52.4 Heparin Dosing Weight: ~ 70 kg  Vital Signs: Temp: 98 F (36.7 C) (01/27 0500) Temp Source: Oral (01/27 0500) BP: 88/55 (01/27 0600) Pulse Rate: 71 (01/27 0600)  Labs: Recent Labs    12/25/23 0414 12/25/23 0952 12/25/23 1536 12/26/23 0348 12/26/23 1505 12/27/23 0438  HGB 7.5*  --   --  7.3*  --  7.2*  HCT 25.1*  --   --  24.4*  --  24.5*  PLT 118*  --   --  124*  --  144*  APTT 112*  --   --  127*  --   --   LABPROT  --  15.1  --  14.7  --  15.5*  INR  --  1.2  --  1.1  --  1.2  HEPARINUNFRC 0.49  --   --  0.50  --  0.61  CREATININE 1.05*  --    < > 1.25* 1.27* 1.03*   < > = values in this interval not displayed.    Estimated Creatinine Clearance: 47.7 mL/min (A) (by C-G formula based on SCr of 1.03 mg/dL (H)).  Warfarin doses received (INR): 1/15 - 2.5 (1.1) 1/16 - 2.5 (1.1) 1/17 - 2.5 (1.2) 1/18 - 2.5 (1.2) 1/19 - 5 (1.3) 1/20 - 5 (1.3) 1/21 >> 1/24 - held for tunneled access   Assessment: 73 yo female s/p scheduled bioprosthetic mitral valve replacement with tissue valve 12/30 now with afib, pharmacy asked to dose anticoagulation with IV heparin. Spontaneously converted 1/7; flipped back to AF overnight 1/9 0300. She is noted on CRRT. Baseline INR 1.1, slow uptrend to 1.3 s/p 6 days warfarin therapy, total dose 20 mg, then held 1/21-24 for tunneled access plans. Patient now s/p [facility] and tunneled central line placement 1/24 AM.  CBC remains low-stable, off norepinephrine since 1/26, remains on midodrine, and droxidopa (added 1/21) for hemodynamic tolerance of dialysis. Per NP with VIR okay to resume anticoagulation after tunneled cath 1/24.  HL therapeutic this morning at 0.5 on 1050 units/hr since 1/22. INR subtherapeutic  at 1.2 from 1.1 yesterday.  Goal of Therapy:  Heparin level 0.3-0.5 units/ml INR goal 2 - 3 (bMVR) Monitor platelets by anticoagulation protocol: Yes   Plan:  Continue IV heparin at 1050 units/hr Warfarin 5 mg  tonight Daily heparin level, CBC and INR.  Rutherford Nail, PharmD PGY2 Critical Care Pharmacy Resident  12/27/2023 6:28 AM   [facility] pharmacy phone numbers are listed on [email or web address]

## 2023-12-27 NOTE — Progress Notes (Signed)
Physical Therapy Treatment Patient Details Name: Vanessa [name] MRN: [id number] DOB: 1951-11-21 Today's Date: 12/27/2023   History of Present Illness 73 yo F admitted 12/30 with DOE, increasing MR and TR. LCH without CAD. 12/30 MVR and tricuspid valve repair. Intubated 1/2-1/5. 1/2 CRRT started/stopped on 1/12. Pt HD line was removed on 1/13 and CRRT was re-started on 1/14-1/20, restarted on 1/22.  PMhx: HTN, NICM, HFrEF (EF40-45%), Pulmonary HTN, CKD and  severe MR s/p prior mitral clip x 2 in 2020.    PT Comments  Pt with improved spirits stating "The Devil had me but now I'm ready to fight." Pt eager to ambulate and mobilize. Ambulation tolerance limited by CRRT machine and not being able to unplug it. Pt able to complete supine and seated LE exercises as well. Pt happy and smiling at end of session asking for therapy to return tomorrow so she can do more. Continue to recommend aggressive inpatient rehab program > 3 hrs a day to maximize functional recovery. Acute PT to cont to follow.    If plan is discharge home, recommend the following: Assistance with cooking/housework;Help with stairs or ramp for entrance   Can travel by private vehicle        Equipment Recommendations  Rolling walker (2 wheels);BSC/3in1    Recommendations for Other Services Rehab consult     Precautions / Restrictions Precautions Precautions: Sternal;Fall;Other (comment) Precaution Booklet Issued: No Precaution Comments: cortrak Restrictions Weight Bearing Restrictions Per Provider Order: Yes RUE Weight Bearing Per Provider Order: Non weight bearing LUE Weight Bearing Per Provider Order: Non weight bearing Other Position/Activity Restrictions: sternal precautions     Mobility  Bed Mobility Overal bed mobility: Needs Assistance Bed Mobility: Rolling, Supine to Sit     Supine to sit: Mod assist, HOB elevated     General bed mobility comments: minA for LE management, pt doing better on initiating  transfer, modA for trunk elevation from [facility] elevated and to pvt hips to EOB    Transfers Overall transfer level: Needs assistance Equipment used: None (Stedy) Transfers: Sit to/from Stand Sit to Stand: Mod assist, +2 physical assistance, +2 safety/equipment (adhering to sternal precautions)           General transfer comment: pt with posterior bias requiring max verbal cues to lean anteriorly to be able to push self up from LEs    Ambulation/Gait Ambulation/Gait assistance: Min assist, +2 physical assistance, +2 safety/equipment Gait Distance (Feet): 15 Feet Assistive device: Fara Boros Gait Pattern/deviations: Trunk flexed, Step-to pattern, Decreased step length - right, Decreased step length - left, Decreased stride length Gait velocity: decreased Gait velocity interpretation: <1.31 ft/sec, indicative of household ambulator   General Gait Details: pt with improved upright posture this date, appeared more confident with more fluid gait pattern despite decreased step height and length, modA for walker management, RN for CRRT and line management, tech for chair follow   Stairs             Wheelchair Mobility     Tilt Bed    Modified Rankin (Stroke Patients Only)       Balance Overall balance assessment: Needs assistance Sitting-balance support: Single extremity supported, No upper extremity supported, Feet supported Sitting balance-Leahy Scale: Good Sitting balance - Comments: SBA for sitting EOB no LOB   Standing balance support: Single extremity supported, Bilateral upper extremity supported, Reliant on assistive device for balance Standing balance-Leahy Scale: Poor Standing balance comment: reliant on external support  Cognition Arousal: Alert Behavior During Therapy: WFL for tasks assessed/performed, Flat affect Overall Cognitive Status: Within Functional Limits for tasks assessed                                  General Comments: Pt with improved spirits this date stating "the Devil had a hold me for a little while but now i'm ready to fight". pt very motivated and asked for therapy to come back tomorrow so she could do more        Exercises General Exercises - Lower Extremity Ankle Circles/Pumps: AROM, Both, 20 reps, Supine Quad Sets: AROM, Both, 10 reps, Supine    General Comments General comments (skin integrity, edema, etc.): VSS      Pertinent Vitals/Pain Pain Assessment Pain Assessment: Faces Faces Pain Scale: Hurts a little bit Pain Location: sternum; generalized with movement Pain Descriptors / Indicators: Dull, Aching    Home Living                          Prior Function            PT Goals (current goals can now be found in the care plan section) Acute Rehab PT Goals Patient Stated Goal: return home PT Goal Formulation: With patient Time For Goal Achievement: 01/03/24 Potential to Achieve Goals: Good Progress towards PT goals: Progressing toward goals    Frequency    Min 1X/week      PT Plan      Co-evaluation              AM-PAC PT "6 Clicks" Mobility   Outcome Measure  Help needed turning from your back to your side while in a flat bed without using bedrails?: A Lot Help needed moving from lying on your back to sitting on the side of a flat bed without using bedrails?: A Lot Help needed moving to and from a bed to a chair (including a wheelchair)?: A Lot Help needed standing up from a chair using your arms (e.g., wheelchair or bedside chair)?: A Lot Help needed to walk in hospital room?: A Lot Help needed climbing 3-5 steps with a railing? : Total 6 Click Score: 11    End of Session Equipment Utilized During Treatment: Oxygen Activity Tolerance: Patient limited by fatigue Patient left: with call bell/phone within reach;with nursing/sitter in room;in chair Nurse Communication: Mobility status PT Visit Diagnosis: Other  abnormalities of gait and mobility (R26.89);Muscle weakness (generalized) (M62.81);Difficulty in walking, not elsewhere classified (R26.2)     Time: 9528-4132 PT Time Calculation (min) (ACUTE ONLY): 27 min  Charges:    $Gait Training: 8-22 mins $Therapeutic Exercise: 8-22 mins PT General Charges $$ ACUTE PT VISIT: 1 Visit                     Lewis Shock, PT, DPT Acute Rehabilitation Services Secure chat preferred Office #: [phone]    Iona Hansen 12/27/2023, 1:04 PM

## 2023-12-28 DIAGNOSIS — R570 Cardiogenic shock: Secondary | ICD-10-CM | POA: Diagnosis not present

## 2023-12-28 DIAGNOSIS — E861 Hypovolemia: Secondary | ICD-10-CM

## 2023-12-28 DIAGNOSIS — N179 Acute kidney failure, unspecified: Secondary | ICD-10-CM | POA: Diagnosis not present

## 2023-12-28 DIAGNOSIS — Z952 Presence of prosthetic heart valve: Secondary | ICD-10-CM | POA: Diagnosis not present

## 2023-12-28 DIAGNOSIS — I5023 Acute on chronic systolic (congestive) heart failure: Secondary | ICD-10-CM | POA: Diagnosis not present

## 2023-12-28 LAB — GLUCOSE, CAPILLARY
Glucose-Capillary: 107 mg/dL — ABNORMAL HIGH (ref 70–99)
Glucose-Capillary: 128 mg/dL — ABNORMAL HIGH (ref 70–99)
Glucose-Capillary: 131 mg/dL — ABNORMAL HIGH (ref 70–99)
Glucose-Capillary: 90 mg/dL (ref 70–99)
Glucose-Capillary: 92 mg/dL (ref 70–99)
Glucose-Capillary: 94 mg/dL (ref 70–99)
Glucose-Capillary: 97 mg/dL (ref 70–99)

## 2023-12-28 LAB — CBC
HCT: 27.9 % — ABNORMAL LOW (ref 36.0–46.0)
Hemoglobin: 8.5 g/dL — ABNORMAL LOW (ref 12.0–15.0)
MCH: 31.7 pg (ref 26.0–34.0)
MCHC: 30.5 g/dL (ref 30.0–36.0)
MCV: 104.1 fL — ABNORMAL HIGH (ref 80.0–100.0)
Platelets: 144 10*3/uL — ABNORMAL LOW (ref 150–400)
RBC: 2.68 MIL/uL — ABNORMAL LOW (ref 3.87–5.11)
RDW: 24.4 % — ABNORMAL HIGH (ref 11.5–15.5)
WBC: 8 10*3/uL (ref 4.0–10.5)
nRBC: 0.8 % — ABNORMAL HIGH (ref 0.0–0.2)

## 2023-12-28 LAB — RENAL FUNCTION PANEL
Albumin: 2.3 g/dL — ABNORMAL LOW (ref 3.5–5.0)
Albumin: 2.5 g/dL — ABNORMAL LOW (ref 3.5–5.0)
Anion gap: 7 (ref 5–15)
Anion gap: 8 (ref 5–15)
BUN: 20 mg/dL (ref 8–23)
BUN: 18 mg/dL (ref 8–23)
CO2: 26 mmol/L (ref 22–32)
CO2: 26 mmol/L (ref 22–32)
Calcium: 7.9 mg/dL — ABNORMAL LOW (ref 8.9–10.3)
Calcium: 8.2 mg/dL — ABNORMAL LOW (ref 8.9–10.3)
Chloride: 99 mmol/L (ref 98–111)
Chloride: 100 mmol/L (ref 98–111)
Creatinine, Ser: 1.04 mg/dL — ABNORMAL HIGH (ref 0.44–1.00)
Creatinine, Ser: 0.98 mg/dL (ref 0.44–1.00)
GFR, Estimated: 57 mL/min — ABNORMAL LOW (ref >60)
GFR, Estimated: >60 mL/min (ref >60)
Glucose, Bld: 127 mg/dL — ABNORMAL HIGH (ref 70–99)
Glucose, Bld: 99 mg/dL (ref 70–99)
Phosphorus: 3.1 mg/dL (ref 2.5–4.6)
Phosphorus: 1.7 mg/dL — ABNORMAL LOW (ref 2.5–4.6)
Potassium: 4.2 mmol/L (ref 3.5–5.1)
Potassium: 3.8 mmol/L (ref 3.5–5.1)
Sodium: 132 mmol/L — ABNORMAL LOW (ref 135–145)
Sodium: 134 mmol/L — ABNORMAL LOW (ref 135–145)

## 2023-12-28 LAB — BPAM RBC
Blood Product Expiration Date: 202502172359
ISSUE DATE / TIME: 202501271155
Unit Type and Rh: 6200

## 2023-12-28 LAB — TYPE AND SCREEN
ABO/RH(D): A POS
Antibody Screen: NEGATIVE
Unit division: 0

## 2023-12-28 LAB — COOXEMETRY PANEL
Carboxyhemoglobin: 2.7 % — ABNORMAL HIGH (ref 0.5–1.5)
Methemoglobin: <0.7 % (ref 0.0–1.5)
O2 Saturation: 80.6 %
Total hemoglobin: 9 g/dL — ABNORMAL LOW (ref 12.0–16.0)

## 2023-12-28 LAB — HEPARIN LEVEL (UNFRACTIONATED)
Heparin Unfractionated: 0.74 [IU]/mL — ABNORMAL HIGH (ref 0.30–0.70)
Heparin Unfractionated: 0.54 [IU]/mL (ref 0.30–0.70)

## 2023-12-28 LAB — PROTIME-INR
INR: 1.3 — ABNORMAL HIGH (ref 0.8–1.2)
Prothrombin Time: 16.5 s — ABNORMAL HIGH (ref 11.4–15.2)

## 2023-12-28 LAB — MAGNESIUM: Magnesium: 2.6 mg/dL — ABNORMAL HIGH (ref 1.7–2.4)

## 2023-12-28 MED ORDER — SODIUM PHOSPHATES 45 MMOLE/15ML IV SOLN
30.0000 mmol | Freq: Once | INTRAVENOUS | Status: AC
  Administered 2023-12-28: 30 mmol via INTRAVENOUS
  Filled 2023-12-28: qty 10

## 2023-12-28 MED ORDER — SALINE SPRAY 0.65 % NA SOLN
1.0000 | NASAL | Status: DC | PRN
  Filled 2023-12-28: qty 44

## 2023-12-28 MED ORDER — BANATROL TF EN LIQD
60.0000 mL | Freq: Two times a day (BID) | ENTERAL | Status: DC
  Filled 2023-12-28: qty 60

## 2023-12-28 MED ORDER — ENSURE ENLIVE PO LIQD
237.0000 mL | Freq: Three times a day (TID) | ORAL | Status: DC
  Administered 2023-12-28 – 2024-01-02 (×7): 237 mL via ORAL
  Filled 2023-12-28 (×3): qty 237

## 2023-12-28 MED ORDER — WARFARIN SODIUM 5 MG PO TABS
5.0000 mg | ORAL_TABLET | Freq: Once | ORAL | Status: AC
  Administered 2023-12-28: 5 mg via ORAL
  Filled 2023-12-28: qty 1

## 2023-12-28 MED ORDER — POTASSIUM & SODIUM PHOSPHATES 280-160-250 MG PO PACK
1.0000 | PACK | Freq: Once | ORAL | Status: AC
  Administered 2023-12-28: 1
  Filled 2023-12-28: qty 1

## 2023-12-28 NOTE — Progress Notes (Signed)
Patient ID: Vanessa [name], female   DOB: [birth year], 73 y.o.   MRN: [id number]      Advanced Heart Failure Rounding Note  Cardiologist: Dr. Shirlee Latch  Chief Complaint: Cardiogenic Shock  Subjective:   12/30 S/P MVR (tissue) and TV ring and same day return to OR for tamponade/bleeding. 1/2: Progressive volume overload with poor UOP, CVVH started but ended up re-intubated.   Extubated 12/05/23 1/12: CVVH stopped 1/13: iHD, HD line removed 1/14: CRRT Restarted.  1/17: RHC mean RA 2, PA 47/20, mean PCWP 5, CI 1.74, PVR 8, PAPi 9.  Milrinone 0.125 started. 1/20 CRRT stopped.  1/22 methylene blue for persistent vasoplegia  1/24 tunneled HD cath placed. CRRT restarted  On midodrine 20 + droxidopa. SBP 80-90s. Co-ox 81. CVP 5. Remains net even on CRRT.   Feeling well this morning. Worked with PT yesterday. Did not sleep well overnight. No CP, SOB or dizziness.   Objective:    Weight Range: 74.4 kg Body mass index is 29.06 kg/m.   Vital Signs:   Temp:  [97.4 F (36.3 C)-99 F (37.2 C)] 98.6 F (37 C) (01/27 2320) Pulse Rate:  [71-94] 78 (01/28 0700) Resp:  [13-73] 28 (01/28 0700) BP: (79-128)/(50-80) 105/60 (01/28 0700) SpO2:  [92 %-98 %] 95 % (01/28 0700) Weight:  [74.4 kg] 74.4 kg (01/28 0432) Last BM Date : 12/26/23  Weight change: Filed Weights   12/26/23 0500 12/27/23 0435 12/28/23 0432  Weight: 78 kg 74.3 kg 74.4 kg   Intake/Output:   Intake/Output Summary (Last 24 hours) at 12/28/2023 0706 Last data filed at 12/28/2023 0700 Gross per 24 hour  Intake 2938.19 ml  Output 2928.5 ml  Net 9.69 ml    Physical Exam   CVP 5 General: Deconditioned appearing. No distress on [place] HEENT: neck supple.   Cardiac: JVP not visible (RIJ dressing in place). S1 and S2 present. No murmurs or rub. Resp: Lung sounds clear and equal B/L Abdomen: Soft, non-tender, non-distended. +hypo BS. Extremities: Warm and dry. No rash, cyanosis.  No edema.  Neuro: Alert and oriented x3. Affect  pleasant. Moves all extremities without difficulty. Lines/Devices:  LIJ CVC, RIJ HDL, CorTrak  Telemetry   SR 70-80s with 1deg AVB (personally reviewed)  Labs    CBC Recent Labs    12/27/23 2254 12/28/23 0427  WBC 8.2 8.0  HGB 8.4* 8.5*  HCT 27.7* 27.9*  MCV 105.3* 104.1*  PLT 147* 144*   Basic Metabolic Panel Recent Labs    19/14/78 0438 12/27/23 1528 12/28/23 0427  NA 133* 135 132*  K 4.6 5.0 4.2  CL 100 102 99  CO2 26 25 26   GLUCOSE 132* 120* 127*  BUN 16 18 20   CREATININE 1.03* 1.02* 1.04*  CALCIUM 8.1* 8.1* 7.9*  MG 2.7*  --  2.6*  PHOS 2.2* 2.3* 3.1   Liver Function Tests Recent Labs    12/27/23 1528 12/28/23 0427  ALBUMIN 2.4* 2.3*   No results for input(s): "LIPASE", "AMYLASE" in the last 72 hours. Cardiac Enzymes No results for input(s): "CKTOTAL", "CKMB", "CKMBINDEX", "TROPONINI" in the last 72 hours.  BNP: BNP (last 3 results) Recent Labs    05/26/23 1610 08/26/23 1103 09/22/23 0905  BNP 778.0* 781.0* 926.0*   Other results:  Imaging   No results found.  Medications:    Scheduled Medications:  amiodarone  200 mg Oral BID   ascorbic acid  250 mg Per Tube BID   Chlorhexidine Gluconate Cloth  6 each Topical Daily  darbepoetin (ARANESP) injection - NON-DIALYSIS  100 mcg Subcutaneous Q Tue-1800   droxidopa  200 mg Per Tube TID WC   feeding supplement (PROSource TF20)  60 mL Per Tube BID   fiber supplement (BANATROL TF)  60 mL Per Tube QID   Gerhardt's butt cream   Topical BID   influenza vaccine adjuvanted  0.5 mL Intramuscular Tomorrow-1000   insulin aspart  0-24 Units Subcutaneous Q4H   metoCLOPramide (REGLAN) injection  5 mg Intravenous Q8H   midodrine  20 mg Per Tube Q8H   mirtazapine  7.5 mg Oral QHS   multivitamin  1 tablet Per Tube BID   QUEtiapine  25 mg Per Tube QHS   sertraline  25 mg Per Tube Daily   Warfarin - Pharmacist Dosing Inpatient   Does not apply q1600   zinc sulfate (50mg  elemental zinc)  220 mg Per Tube  Daily    Infusions:   prismasol BGK 4/2.5 400 mL/hr at 12/28/23 0213    prismasol BGK 4/2.5 400 mL/hr at 12/28/23 1610   sodium chloride Stopped (12/26/23 1005)   anticoagulant sodium citrate     feeding supplement (VITAL 1.5 CAL) 40 mL/hr at 12/28/23 0700   heparin 1,050 Units/hr (12/28/23 0700)   prismasol BGK 2/2.5 dialysis solution 1,500 mL/hr at 12/28/23 0545    PRN Medications: sodium chloride, alteplase, anticoagulant sodium citrate, artificial tears, bisacodyl **OR** bisacodyl, heparin, heparin, HYDROmorphone (DILAUDID) injection, levalbuterol, lidocaine (PF), lidocaine-prilocaine, lip balm, melatonin, ondansetron (ZOFRAN) IV, mouth rinse, oxyCODONE, pentafluoroprop-tetrafluoroeth, [START ON 12/31/2023] pneumococcal 20-valent conjugate vaccine, sodium chloride, traZODone  Patient Profile   Vanessa [name] is a 73 y.o. with history of nonischemic cardiomyopathy and severe MR s/p Mitral Clip x2 (2020), pHTN, and CKD IIIb.   Assessment/Plan   1. Post Cardiotomy Cardiogenic & septic shock: Bioprosthetic MVR and tricuspid ring 11/29/23 with Dr. Leafy Ro. Pre-op cath with no significant CAD.  Pre-op echo with EF 40-45%, severe MR. Went back to the OR with post-op tamponade for clean-out.  Echo 12/31 showed EF 25-30%, moderate RV dysfunction, s/p bioprosthetic mitral valve replacement with trivial MR and tricuspid valve repair with trivial TR. Worsened on 1/2 and re-intubated in setting of volume overload, oliguric renal failure &  pulmonary hypertension. Extubated on 12/04/22. 1/13 CT C/A/P  moderate hemorrhagic pericardial effusion and 5.3 cm hematoma right heart border.  Repeat echo no tamponade. Requiring CRRT for volume removal.  Failed previous attempts to wean from CRRT. - Post -op echo 12/14/23 EF 35-40% RV mod reduced. Valves stable - RHC 1/17 showed RA 3 PA 47/20 (30) PCWP 5 PAPi 9 CI 1.74 - c/w vasoplegia. Continues with vasoplegia. - Co-ox 81. On High dose midodrine 20 tid +  droxidopa. BP remains marginal. - Remains anuric. CVP 5  On CRRT, keep even. - Continue compression hose 2. Mitral regurgitation: s/p Mitraclip x 2 1/20. Echo 9/24 showed MV s/p Mitraclip x 2 with mean gradient 6 mmHg and severe mitral regurgitation.  She was not thought to be a candidate for an additional Mitraclip due to lack of room between the two existing clips. S/p MV replacement and Tricuspid repair on 11/29/23 with Dr. Leafy Ro.  - Last echo stable valves.  3. Ventricular ectopy:  - Quiescent now  - Continue amiodarone 200 mg bid  4. Pulmonary hypertension: Pre-op RHC (11/24) with PVR 4.5, with moderate mixed pulmonary arterial and pulmonary venous hypertension likely due to mitral valve regurgitation. Now s/p MVR, PA pressure severely elevated post-op.  Suspect still mixed PAH/PVH  with elevated PCWP.  NO was tried at 10 ppm but did not have much effect so now off.  Will hold off on pulmonary vasodilators; small clinical trials demonstrate no improvement and some markers of harm with PDE5i in PH in the setting of mitral valve disease. RHC 1/17 with mild pulmonary arterial hypertension.  5. 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 TSH was normal in 8/24. Follows with Endocrinology 6. Acute renal failure:  1/14 CRRT restarted. CRRT stopped 1/20 and restarted 1/22. Remains anuric.  - Remains anuric. CVP 5. TDC in place. On CRRT, keep even.  - Now that off pressors would like to see her transition to [facility], however may not be able to tolerate with current BP.  - Nephrology following.   7. Blood loss anemia - worsening be renal failure - 1u RBCs yesterday for hgb 7.2>8.5 today.  8. Complete heart block: She had post-op CHB. Resolved.  9. Hypoxic / Hypercapnic respiratory failure: Reintubated 01/02.  Extubated 01/05.  - Now on RA to 2L [place] with stable sats.  10. Paroxsymal Atrial fibrillation:  - In SR on po amio.   - On Heparin drip.  - Warfarin dosing d/w pharmacy. INR 1.3 11.  ID: Suspected septic shock component, no definite source. All lines changed out last week and started on linezolid/meropenem, now just on meropenem.  Afebrile. Leukocytosis resolved  - Meropenem stopped 1/25 12. Thrombocytopenia: Suspect due to sepsis. Platelets trending back up 144K today.  13. Deconditioning - Continue PT 14. Protein-calorie malnutrition - continue TFs via Cor-trak  [place] Sabine Tenenbaum, NP 12/28/23  Advanced Heart Failure Team Pager [phone] (M-F; 7a - 5p)  Please contact [facility] for night-coverage after hours (4p -7a ) and weekends on [email or web address]

## 2023-12-28 NOTE — Progress Notes (Signed)
   Inpatient Rehabilitation Admissions Coordinator   Remains on CRRT. Will not place rehab consult at this time.  Ottie Glazier, RN, MSN Rehab Admissions Coordinator [phone] 12/28/2023 2:34 PM

## 2023-12-28 NOTE — Procedures (Signed)
I was present at this CRRT session. I have reviewed the session itself and made appropriate changes.  Will continue with CRRT for another 24 hours and then see if we can transition to IHD.   Vital signs in last 24 hours:  Temp:  [97.4 F (36.3 C)-99 F (37.2 C)] 98.6 F (37 C) (01/27 2320) Pulse Rate:  [71-94] 75 (01/28 0800) Resp:  [13-73] 36 (01/28 0800) BP: (79-128)/(50-80) 98/61 (01/28 0800) SpO2:  [92 %-97 %] 94 % (01/28 0800) Weight:  [74.4 kg] 74.4 kg (01/28 0432) Weight change: 0.1 kg Filed Weights   12/26/23 0500 12/27/23 0435 12/28/23 0432  Weight: 78 kg 74.3 kg 74.4 kg    Recent Labs  Lab 12/28/23 0427  NA 132*  K 4.2  CL 99  CO2 26  GLUCOSE 127*  BUN 20  CREATININE 1.04*  CALCIUM 7.9*  PHOS 3.1    Recent Labs  Lab 12/27/23 0438 12/27/23 2254 12/28/23 0427  WBC 6.3 8.2 8.0  HGB 7.2* 8.4* 8.5*  HCT 24.5* 27.7* 27.9*  MCV 109.9* 105.3* 104.1*  PLT 144* 147* 144*    Scheduled Meds:  amiodarone  200 mg Oral BID   ascorbic acid  250 mg Per Tube BID   Chlorhexidine Gluconate Cloth  6 each Topical Daily   darbepoetin (ARANESP) injection - NON-DIALYSIS  100 mcg Subcutaneous Q Tue-1800   droxidopa  200 mg Per Tube TID WC   feeding supplement (PROSource TF20)  60 mL Per Tube BID   fiber supplement (BANATROL TF)  60 mL Per Tube QID   Gerhardt's butt cream   Topical BID   influenza vaccine adjuvanted  0.5 mL Intramuscular Tomorrow-1000   insulin aspart  0-24 Units Subcutaneous Q4H   metoCLOPramide (REGLAN) injection  5 mg Intravenous Q8H   midodrine  20 mg Per Tube Q8H   mirtazapine  7.5 mg Oral QHS   multivitamin  1 tablet Per Tube BID   QUEtiapine  25 mg Per Tube QHS   sertraline  25 mg Per Tube Daily   warfarin  5 mg Oral ONCE-1600   Warfarin - Pharmacist Dosing Inpatient   Does not apply q1600   zinc sulfate (50mg  elemental zinc)  220 mg Per Tube Daily   Continuous Infusions:   prismasol BGK 4/2.5 400 mL/hr at 12/28/23 0213    prismasol BGK 4/2.5  400 mL/hr at 12/28/23 7829   sodium chloride Stopped (12/26/23 1005)   anticoagulant sodium citrate     feeding supplement (VITAL 1.5 CAL) 40 mL/hr at 12/28/23 0800   heparin 1,050 Units/hr (12/28/23 0700)   prismasol BGK 2/2.5 dialysis solution 1,500 mL/hr at 12/28/23 0545   PRN Meds:.sodium chloride, alteplase, anticoagulant sodium citrate, artificial tears, bisacodyl **OR** bisacodyl, heparin, heparin, HYDROmorphone (DILAUDID) injection, levalbuterol, lidocaine (PF), lidocaine-prilocaine, lip balm, melatonin, ondansetron (ZOFRAN) IV, mouth rinse, oxyCODONE, pentafluoroprop-tetrafluoroeth, [START ON 12/31/2023] pneumococcal 20-valent conjugate vaccine, sodium chloride, traZODone   Irena Cords,  MD 12/28/2023, 8:33 AM

## 2023-12-28 NOTE — Progress Notes (Signed)
NAME:  Vanessa [name], MRN:  [id number], DOB:  05-Oct-1951, LOS: 29 ADMISSION DATE:  11/29/2023, CONSULTATION DATE:  11/29/23 REFERRING MD:  Dr. Leafy Ro, CHIEF COMPLAINT:  postop   History of Present Illness:  72yoF with PMH significant for HTN, NICM, HFrEF (EF40-45%), PHTN, CKD and hx of prior severe MR s/p prior mitral clip x 2 in 2020.  Pt with progressive DOE found to have increasing MR  and moderate TR despite adjustments in GDMT.  Not felt to be candidate for additional mitral clip.  Repeat LHC without CAD but showed moderate PHTN.  Underwent mitral valve replacement with tissue valve and tricuspid valve repair by Dr. Leafy Ro on 12/30.  PCCM consulted to assist with vent and medical management post-operatively in ICU.   Pertinent  Medical History  Never smoker, MR w/ previous Mitral clip x 2 (2020), NICM, HFrEF, PHTN, HTN, CKD3b  Significant Hospital Events: Including procedures, antibiotic start and stop dates in addition to other pertinent events   MVR by Dr. Leafy Ro; relook for tamponade/washout later in evening without obvious source 1/2 intubated for respiratory decompensation, iNO started. Vanc, meropenem started. CRRT started.  1/5 extubated 1/8 converted to NSR overnight 1/9 back to A.fib around 0200 1/11 R introducer out.  Unable to thread right subclavian catheter 1/12 remains weak still pressor dependent 1/13 repeat [facility] sent. Still on pressors..,  Zosyn changed to meropenem.  Chest abdomen and pelvis CT obtained : Moderate hemorrhagic pericardial effusion, 5.3 cm hematoma right thrombus. Arterial gas bilateral patchy opacities mild dependent posterior right upper lobe atelectasis was unremarkable layering gallstones; neg for tamponade on repeat echo 1/14 new right IJ HD catheter placed.  The echocardiogram Was obtained to better evaluate her EF is 35 to 40%.  The LV demonstrates regional wall motion abnormalities there is asymmetric left ventricular hypertrophy of the inferior  lateral segment RV systolic function moderate regular reduced left atrial wall dilated no mention of clot.  1/15 still on fairly high-dose norepinephrine 1/16 cont CRRT + low dose NE  1/17 RHC confirms low output state, restarted on milrinone 1/20 CRRT stopped 1/21: dc milrinone yesterday, ongoing increase NE requirements during nights. Started droxidopa in addition to midodrine to aid off NE.  1/22: levo up to 18 overnight (max so far), cvp 9, restarting crrt today. Received dose methylene blue, Cortisol 21 1/23 Levo down to 9, CVP 8 1/24 tunneled HD and tunneled dual lumen LIJ CVC placed  1/27 1 unit PRBC given for Hgb 7.2 and hypotension. Off levo.   Interim History / Subjective:  Tired. No complaints otherwise Nausea better, but feels too full to eat much.  Not pulling much on IS Pressors remain off SBP in 90s to 100s.   Objective   Blood pressure 98/76, pulse 81, temperature (!) 97.2 F (36.2 C), temperature source Axillary, resp. rate 15, height 5\' 3"  (1.6 m), weight 74.4 kg, SpO2 95%. CVP:  [1 mmHg-13 mmHg] 4 mmHg      Intake/Output Summary (Last 24 hours) at 12/28/2023 0935 Last data filed at 12/28/2023 0913 Gross per 24 hour  Intake 3048.01 ml  Output 3012 ml  Net 36.01 ml   Filed Weights   12/26/23 0500 12/27/23 0435 12/28/23 0432  Weight: 78 kg 74.3 kg 74.4 kg   Examination:   General:  Elderly female in NAD HEENT: [place]/AT, PERRL, no JVD Neuro: Alert, oriented, non-focal CV: RRR, no MRG PULM:  Shallow , diminished. No distress GI: Soft, NT, ND normoactive.  Extremities: Compression stockings.  Skin: Grossly intact.   CVP 2-3 Coox 80 CRRT keeping even Wts 77.1> 79.5> 78 > 74.4 kg I&O even x 24hrs  Na 132 Creat 1.0 BUN 20 Phos 3.1 Mg 2.6 Hgb 8.5 Plt 144  Resolved Hospital Problem list :   Postoperative vent management Postop tamponade resolved after relook and washout on day 1  Assessment & Plan:   Sev mitral regurg s/p prior mitral clip, now s/p  MVR Moderate tricuspid regurgitation s/p TV repair Persistent cardioplegia postoperatively- confirmed on RHC pHTN AoC HFrEF NICM pAF, flutter  S/p MVR and tricuspid ring 12/30 w/ [place]. Back to OR for tamponade. Decompensated requiring intubation for FVO, renal failure, pHTN. 1/17 RHC with low filling pressures and started on milrinone, subsequently off. s/p methylene blue 1/22 P:  - per AHF,  greatly appreciate assistance  - Amiodarone 200mg  BID - heparin gtt with warfarin bridge started 1/15, INR 1.3 - SBP goal - cont midodrine 20mg  TID, droxidopa 200mg  TID - TED hose - trending CVP and coox  Acute renal failure on CKD III, on CRRT 1/14 CRRT started, stopped 1/20., restarted 1/22  - CRRT planning to come off tomorrow.  - Remains anuric - renal indices BID - concerned she may not have renal recovery and will be unable to tolerate iHD - strict I/O - Electrolytes per nephro.   R/o Sepsis,  unclear source -  pan scan 1/13 with patchy lower lobe opacities, no AP findings.  No definite source. Lines were replaced 1/24.  Off meropenem since 1/25.  BC and UC neg 1/13, - remains afebrile with normal WBC, improving thrombocytopenia.  Monitor clinically off abx  Protein calorie malnutrition, severe Early satiety - Continue TF via cortrak. Poor appetite. Nocturnal feeds and lower Kcal did not improve appetite.  - mirtazapine add 1/25 to hopefully help appetite, monitor for oversedation on seroquel - Reglan started 1/27. She feels too full to eat.   Acute on chronic anemia Thrombocytopenia, potentially due to antibiotics, doubt sepsis, improving - Hgb drifting down. 8.4 today after 1 unit PRBC yesterday.  Physical deconditioning  - PT, OT, mobility remains limited with CRRT  Deep tissue injury on left buttocks approximately 2 cm in diameter purple discoloration - WOC, turns, maximize nutrition   Situational depression Sleep disturbance - con't melatonin prn, trazodone  prn,  seroquel 25mg  qHS, mirtazepine 7.5mg  qhs - zoloft 25mg  daily   Hyperglycemia/ prediabetes - A1c 6 11/25/2023 - SSI prn, CBG q4  Goiter - off methimazole. TSH wnl - Outpatient endocrinology follow up.   Best Practice (right click and "Reselect all SmartList Selections" daily)   Diet/type: Encourage PO, no appetite, supplement TF DVT prophylaxis systemic heparin/ warfarin Pressure ulcer(s): L buttocks  GI prophylaxis: N/A Lines: HD cath 1/24, tunneled CVC 1/24 Foley:  N/A Code Status:  full code Last date of multidisciplinary goals of care discussion [per primary]  CCT: 44 minutes   Joneen Roach, AGACNP-BC [facility]  See Amion for personal pager PCCM on call pager [phone] until 7pm. Please call Elink 7p-7a. [phone]  12/28/2023 9:35 AM

## 2023-12-28 NOTE — Progress Notes (Signed)
Nutrition Follow-up  DOCUMENTATION CODES:   Not applicable  INTERVENTION:   Continue Liberalized diet-Regular; educated and encouraged pt on small, frequent meals for now, sips/bites throughout the day. OK for family/friends to bring in outside food for pt if she prefers  Pt is anuric; with transition to iHD, if pt continues to mostly consume liquids may need to resume fluid restriction only  Pt agreeable to trying Ensure Enlive (again) but prefers Chocolate only po TID, each supplement provides 350 kcal and 20 grams of protein. Plan to send frequently, between meals but pt was educated that the initial goal is not to consume 100% at one time. Goal is to try and then sip on small amounts but stop before overly full.   Tube Feeding Plan: Discussed with pt, RN, Dr. Chestine Spore and Renae Fickle NP. Plan to hold TF today to see if pt able to eat a little more throughout the day and reassess tomorrow. Calorie Count ordered to better assess oral intake. Cortrak to remain in place  Pt also willing to try Chocolate Magic Cup, plan to add to Lunch and Dinner tray  Continue Renal MVI, Vit C, Zinc  Reduced Banatrol TF to BID, each packet contains 5 g soluble fiber. Continue to monitor stool output and consistency  NUTRITION DIAGNOSIS:   Inadequate oral intake related to acute illness as evidenced by NPO status.  Being addressed via TF   GOAL:   Patient will meet greater than or equal to 90% of their needs  Progressing  MONITOR:   PO intake, Supplement acceptance, TF tolerance, Labs, Weight trends  REASON FOR ASSESSMENT:   Consult Enteral/tube feeding initiation and management, Assessment of nutrition requirement/status (CRRT)  ASSESSMENT:   73 yo admitted with severe MR/TR requiring MVR/TVR complicated by post op bleeding and shock. Pt intubated and CRRT initiated. PMH includes HTN, NICM, HFrEF (EF40-50%), HTN, CKD and hx of prior severe MR s/p mitral clip x 2 in 2020.  12/30 Admitted, severe  MR, moderate TR, OR for MVR with mosaic porcine tissue prosthesis, TVR, post op cardiogenic shock, post op bleeding requiring return to OR for washout 12/31 Extubated 01/02 Re-Intubated, iNO started, CRRT initiated 01/03 Cortrak, trickle TF 01/09 US abdomen with dilated GB, no stones/sludge 01/05 Extubated 01/12 CRRT discontinued, diet advanced to 2g sodium 01/14 CRRT resumed 01/17 Transitioned to nocturnal TF to see if po improves, Calorie Count. RHC with low cardiac output, restarted on milrinone 01/20 CRRT stopped, Calorie Count revealed pt eating minimally (7% calorie needs, 2% protein needs), not taking supplements 01/22 Full TF resumed   Pt remains on CRRT, sitting up in chair. Remains anuric; note possible iHD in the next 24-48 hours  Pt has been on and off continuous feedings since RD last did formal assessment due to nausea but no vomiting. TF on hold currently from working with PT this AM and remains on hold as RN indicating pt does not want to resume.    Pt ate 25-50% of grits and some bacon and drank 2 ounces of orange juice with sips of "chocolate milk-Mighty Shake." Pt now reports she feels overly full and uncomfortable. This is the most pt has eaten in one sitting since admission ( at least to this RDs knowledge). Yesterday, pt drank 2 orange juices and that was it. Pt now frustrated as she was told to eat some, so she did and now she does not feel well.  RD encouraged pt by noting her progress as pt attempting to take po (where  as before pt not taking much of anything) but acknowledged that pt may have  "over did it" at breakfast and needs to focus on small, frequent meals throughout the day currently, encouraged bites/sips throughout the day. This meal pattern will stimulate GI tract, and hopefully appetite, and will allow more steady (and hopefully increased) intake throughout the day.  Pt does not want to eat lunch currently but she is drinking [place] Cola.   RD reviewed and  reinforced importance of adequate oral nutrition, in particular protein intake, if TF stopped. Re-visited protein supplements with pt; pt agreeable to trying Chocolate Ensure and Chocolate Magic Cup  Pt denies nausea at present; +BM yesterday, no BM yet today. Remains on IV reglan. Noted remeron started on 1/25 for mood,appetite. Remains on Banatrol TF 4 times daily, RN reports BM yesterday remains type 7 but small volume (75 mL in 24 hours). Plan to continue [facility] TF for now but reduced to 2 times daily  Current wt 74.4 kg; lowest wt 72.4 kg this admission. Pt with fluid loss since admission but concerned pt may have lost muscle and fat. Plan to repeat NFPE on follow-up  Labs: sodium 132 (L), BUN 20 (wdl), Creatinine 1.04, phosphorus 3.1 (wdl), magnesium 2.6 (H) Meds: Renal MVI, Vitamin C, Zinc, remeron, reglan, midodrine, ss novolog   Diet Order:   Diet Order             Diet regular Room service appropriate? Yes; Fluid consistency: Thin  Diet effective now                   EDUCATION NEEDS:   Not appropriate for education at this time  Skin:  Skin Assessment: Skin Integrity Issues: Skin Integrity Issues:: DTI DTI: L Buttocks Incisions: chest post sternotomy (closed)  Last BM:  1/27 via "flatus pouch", type 7,only 75 mL  Height:   Ht Readings from Last 1 Encounters:  12/26/23 5\' 3"  (1.6 m)    Weight:   Wt Readings from Last 1 Encounters:  12/28/23 74.4 kg     BMI:  Body mass index is 29.06 kg/m.  Estimated Nutritional Needs:   Kcal:  1600-1800 kcals  Protein:  85-105 g  Fluid:  1L plus UOP   Romelle Starcher MS, RDN, LDN, CNSC Registered Dietitian 3 Clinical Nutrition RD Inpatient Contact Info in Amion

## 2023-12-28 NOTE — Progress Notes (Signed)
PHARMACY - ANTICOAGULATION CONSULT NOTE  Pharmacy Consult for IV heparin + warfarin Indication: atrial fibrillation  No Known Allergies  Patient Measurements: Height: 5\' 3"  (160 cm) Weight: 74.4 kg (164 lb 0.4 oz) IBW/kg (Calculated) : 52.4 Heparin Dosing Weight: ~ 70 kg  Vital Signs: Temp: 97.3 F (36.3 C) (01/28 1128) Temp Source: Axillary (01/28 1128) BP: 108/66 (01/28 1745) Pulse Rate: 81 (01/28 1745)  Labs: Recent Labs    12/26/23 0348 12/26/23 1505 12/27/23 0438 12/27/23 1528 12/27/23 2254 12/28/23 0427 12/28/23 1551  HGB 7.3*  --  7.2*  --  8.4* 8.5*  --   HCT 24.4*  --  24.5*  --  27.7* 27.9*  --   PLT 124*  --  144*  --  147* 144*  --   APTT 127*  --   --   --   --   --   --   LABPROT 14.7  --  15.5*  --   --  16.5*  --   INR 1.1  --  1.2  --   --  1.3*  --   HEPARINUNFRC 0.50  --  0.61  --   --  0.74* 0.54  CREATININE 1.25*   < > 1.03* 1.02*  --  1.04* 0.98   < > = values in this interval not displayed.    Estimated Creatinine Clearance: 50.1 mL/min (by C-G formula based on SCr of 0.98 mg/dL).  Warfarin doses received (INR): 1/24 - 2.5 mg, 1.2 1/25 - 2.5 mg, 1.1 1/26 - 5 mg, 1.2 1/27 - 5 mg, 1.3   Assessment: 73 yo female s/p scheduled bioprosthetic mitral valve replacement with tissue valve 12/30 now with afib, pharmacy asked to dose anticoagulation with IV heparin. Spontaneously converted 1/7; flipped back to AF overnight 1/9 0300. Baseline INR 1.1, slow uptrend to 1.3 s/p 6 days warfarin therapy, total dose 20 mg, then held 1/21-24 for tunneled access plans. Patient now s/p [facility] and tunneled central line placement 1/24 AM on CRRT, long-term dialysis plan pending.  CBC remains low-stable, off norepinephrine since 1/26, remains on midodrine, and droxidopa (added 1/21) for hemodynamic tolerance of dialysis. Resumed anticoagulation per VIR after tunneled cath placement.  Heparin level 0.54 is therapeutic on 1000 units/hr. Planning for CRRT to iHD  soon.  Goal of Therapy:  Heparin level 0.3 - 0.7 units INR goal 2 - 3 (bMVR) Monitor platelets by anticoagulation protocol: Yes   Plan:  Continue IV heparin to 1000 units/hr Warfarin 5 mg given x1 Daily heparin level, CBC and INR   Alphia Moh, PharmD, BCPS, [facility] Clinical Pharmacist  Please check AMION for all [facility] Pharmacy phone numbers After 10:00 PM, call Main Pharmacy [phone]

## 2023-12-28 NOTE — Progress Notes (Addendum)
PHARMACY - ANTICOAGULATION CONSULT NOTE  Pharmacy Consult for IV heparin + warfarin Indication: atrial fibrillation  No Known Allergies  Patient Measurements: Height: 5\' 3"  (160 cm) Weight: 74.4 kg (164 lb 0.4 oz) IBW/kg (Calculated) : 52.4 Heparin Dosing Weight: ~ 70 kg  Vital Signs: Temp: 98.6 F (37 C) (01/27 2320) Temp Source: Axillary (01/27 2320) BP: 92/56 (01/28 0600) Pulse Rate: 73 (01/28 0600)  Labs: Recent Labs    12/26/23 0348 12/26/23 1505 12/27/23 0438 12/27/23 1528 12/27/23 2254 12/28/23 0427  HGB 7.3*  --  7.2*  --  8.4* 8.5*  HCT 24.4*  --  24.5*  --  27.7* 27.9*  PLT 124*  --  144*  --  147* 144*  APTT 127*  --   --   --   --   --   LABPROT 14.7  --  15.5*  --   --  16.5*  INR 1.1  --  1.2  --   --  1.3*  HEPARINUNFRC 0.50  --  0.61  --   --  0.74*  CREATININE 1.25*   < > 1.03* 1.02*  --  1.04*   < > = values in this interval not displayed.    Estimated Creatinine Clearance: 47.2 mL/min (A) (by C-G formula based on SCr of 1.04 mg/dL (H)).  Warfarin doses received (INR): 1/24 - 2.5 mg, 1.2 1/25 - 2.5 mg, 1.1 1/26 - 5 mg, 1.2 1/27 - 5 mg, 1.3   Assessment: 73 yo female s/p scheduled bioprosthetic mitral valve replacement with tissue valve 12/30 now with afib, pharmacy asked to dose anticoagulation with IV heparin. Spontaneously converted 1/7; flipped back to AF overnight 1/9 0300. Baseline INR 1.1, slow uptrend to 1.3 s/p 6 days warfarin therapy, total dose 20 mg, then held 1/21-24 for tunneled access plans. Patient now s/p [facility] and tunneled central line placement 1/24 AM on CRRT, long-term dialysis plan pending.  CBC remains low-stable, off norepinephrine since 1/26, remains on midodrine, and droxidopa (added 1/21) for hemodynamic tolerance of dialysis. Resumed anticoagulation per VIR after tunneled cath placement.  Diet is improving but still minimal (part of a bacon strip, sips of orange juice) and likely not to affect INR at this time. No new  bleeding noted, CBC remains low stable. No issues with infusion per discussion with RN. AM heparin level supratherapeutic after x3 d slow uptrend.   Goal of Therapy:  Heparin level 0.3 - 0.7 units INR goal 2 - 3 (bMVR) Monitor platelets by anticoagulation protocol: Yes   Plan:  Decrease IV heparin to 1000 units/hr Warfarin 5 mg x1 Confirmatory level 1700 (heparin paused 15 min around 1045 for physical therapy) Daily heparin level, CBC and INR.  Rutherford Nail, PharmD PGY2 Critical Care Pharmacy Resident 12/28/2023 6:36 AM   [facility] pharmacy phone numbers are listed on [email or web address]

## 2023-12-28 NOTE — Progress Notes (Signed)
Physical Therapy Treatment Patient Details Name: Vanessa [name] MRN: [id number] DOB: 12/08/1950 Today's Date: 12/28/2023   History of Present Illness 73 yo F admitted 12/30 with DOE, increasing MR and TR. [facility] without CAD. 12/30 MVR and tricuspid valve repair. Intubated 1/2-1/5. 1/2 CRRT started/stopped on 1/12. Pt HD line was removed on 1/13 and CRRT was re-started on 1/14-1/20, restarted on 1/22.  PMhx: HTN, NICM, HFrEF (EF40-45%), Pulmonary HTN, CKD and  severe MR s/p prior mitral clip x 2 in 2020.    PT Comments  Pt with chief complaint of being "full" and "uncomfortable" today from eating too much. Pt still gave great effort however pt with noted diaphoresis and decreased activity tolerance today compared to yesterday. Acute PT to follow and progress ambulation as patient comes off of CRRT.   Orthostatic BPs: lying 88/52, sitting 96/59, standing 82/59    If plan is discharge home, recommend the following: Assistance with cooking/housework;Help with stairs or ramp for entrance   Can travel by private vehicle        Equipment Recommendations  Rolling walker (2 wheels);BSC/3in1    Recommendations for Other Services Rehab consult     Precautions / Restrictions Precautions Precautions: Sternal;Fall;Other (comment) Precaution Booklet Issued: No Precaution Comments: cortrak Restrictions Weight Bearing Restrictions Per Provider Order: Yes RUE Weight Bearing Per Provider Order: Non weight bearing LUE Weight Bearing Per Provider Order: Non weight bearing Other Position/Activity Restrictions: sternal precautions     Mobility  Bed Mobility Overal bed mobility: Needs Assistance Bed Mobility: Supine to Sit     Supine to sit: Mod assist, HOB elevated     General bed mobility comments: minA for LE management, pt doing better on initiating transfer, modA for trunk elevation from [facility] elevated and to pvt hips to EOB    Transfers Overall transfer level: Needs assistance Equipment  used: None Transfers: Sit to/from Stand Sit to Stand: Mod assist, +2 physical assistance, +2 safety/equipment (adhering to sternal precautions)           General transfer comment: pt with posterior bias requiring max verbal cues to lean anteriorly to be able to push self up from LEs    Ambulation/Gait Ambulation/Gait assistance: Min assist, +2 physical assistance, +2 safety/equipment Gait Distance (Feet): 8 Feet Assistive device: Fara Boros Gait Pattern/deviations: Trunk flexed, Step-to pattern, Decreased step length - right, Decreased step length - left, Decreased stride length Gait velocity: decreased     General Gait Details: due to onset of fatigue and not feeling well pt with decreased ambulation tolerance this date. Pt with noted diaphoresis   Stairs             Wheelchair Mobility     Tilt Bed    Modified Rankin (Stroke Patients Only)       Balance Overall balance assessment: Needs assistance Sitting-balance support: Single extremity supported, No upper extremity supported, Feet supported Sitting balance-Leahy Scale: Good Sitting balance - Comments: SBA for sitting EOB no LOB   Standing balance support: Single extremity supported, Bilateral upper extremity supported, Reliant on assistive device for balance Standing balance-Leahy Scale: Poor Standing balance comment: reliant on external support                            Cognition Arousal: Alert Behavior During Therapy: WFL for tasks assessed/performed, Flat affect Overall Cognitive Status: Within Functional Limits for tasks assessed Area of Impairment: Problem solving, Awareness, Safety/judgement, Memory  Memory: Decreased recall of precautions   Safety/Judgement: Decreased awareness of safety, Decreased awareness of deficits Awareness: Emergent Problem Solving: Slow processing General Comments: despite not feeling well pt responded well to PT and worked with  therapy, pt able to communicate her needs        Exercises General Exercises - Lower Extremity Ankle Circles/Pumps: AROM, Both, 20 reps, Supine Quad Sets: AROM, Both, 10 reps, Supine Long Arc Quad: AROM, Both, Seated, 10 reps    General Comments General comments (skin integrity, edema, etc.): VSS, BP soft      Pertinent Vitals/Pain Pain Assessment Pain Assessment: Faces Faces Pain Scale: Hurts little more Pain Location: abdomen - "I just feel so full and uncomfortable" Pain Descriptors / Indicators: Aching Pain Intervention(s): Monitored during session    Home Living                          Prior Function            PT Goals (current goals can now be found in the care plan section) Acute Rehab PT Goals Patient Stated Goal: feel better PT Goal Formulation: With patient Time For Goal Achievement: 01/03/24 Potential to Achieve Goals: Good Progress towards PT goals: Not progressing toward goals - comment (limited by abdominal pain today)    Frequency    Min 1X/week      PT Plan      Co-evaluation              AM-PAC PT "6 Clicks" Mobility   Outcome Measure  Help needed turning from your back to your side while in a flat bed without using bedrails?: A Lot Help needed moving from lying on your back to sitting on the side of a flat bed without using bedrails?: A Lot Help needed moving to and from a bed to a chair (including a wheelchair)?: A Lot Help needed standing up from a chair using your arms (e.g., wheelchair or bedside chair)?: A Lot Help needed to walk in hospital room?: A Lot Help needed climbing 3-5 steps with a railing? : Total 6 Click Score: 11    End of Session Equipment Utilized During Treatment: Oxygen Activity Tolerance: Patient limited by fatigue Patient left: with call bell/phone within reach;with nursing/sitter in room;in chair Nurse Communication: Mobility status PT Visit Diagnosis: Other abnormalities of gait and mobility  (R26.89);Muscle weakness (generalized) (M62.81);Difficulty in walking, not elsewhere classified (R26.2)     Time: 1610-9604 PT Time Calculation (min) (ACUTE ONLY): 35 min  Charges:    $Gait Training: 8-22 mins $Therapeutic Activity: 8-22 mins PT General Charges $$ ACUTE PT VISIT: 1 Visit                     Lewis Shock, PT, DPT Acute Rehabilitation Services Secure chat preferred Office #: [phone]    Iona Hansen 12/28/2023, 12:42 PM

## 2023-12-28 NOTE — Plan of Care (Signed)
  Problem: Education: Goal: Knowledge of General Education information will improve Description: Including pain rating scale, medication(s)/side effects and non-pharmacologic comfort measures Outcome: Progressing   Problem: Health Behavior/Discharge Planning: Goal: Ability to manage health-related needs will improve Outcome: Progressing   Problem: Clinical Measurements: Goal: Ability to maintain clinical measurements within normal limits will improve Outcome: Progressing Goal: Will remain free from infection Outcome: Progressing Goal: Diagnostic test results will improve Outcome: Progressing Goal: Respiratory complications will improve Outcome: Progressing Goal: Cardiovascular complication will be avoided Outcome: Progressing   Problem: Activity: Goal: Risk for activity intolerance will decrease Outcome: Progressing   Problem: Nutrition: Goal: Adequate nutrition will be maintained Outcome: Progressing   Problem: Coping: Goal: Level of anxiety will decrease Outcome: Progressing   Problem: Elimination: Goal: Will not experience complications related to bowel motility Outcome: Progressing   Problem: Pain Management: Goal: General experience of comfort will improve Outcome: Progressing   Problem: Safety: Goal: Ability to remain free from injury will improve Outcome: Progressing   Problem: Skin Integrity: Goal: Risk for impaired skin integrity will decrease Outcome: Progressing   Problem: Education: Goal: Will demonstrate proper wound care and an understanding of methods to prevent future damage Outcome: Progressing Goal: Knowledge of disease or condition will improve Outcome: Progressing Goal: Knowledge of the prescribed therapeutic regimen will improve Outcome: Progressing Goal: Individualized Educational Video(s) Outcome: Progressing   Problem: Activity: Goal: Risk for activity intolerance will decrease Outcome: Progressing   Problem: Cardiac: Goal: Will  achieve and/or maintain hemodynamic stability Outcome: Progressing   Problem: Clinical Measurements: Goal: Postoperative complications will be avoided or minimized Outcome: Progressing   Problem: Respiratory: Goal: Respiratory status will improve Outcome: Progressing   Problem: Skin Integrity: Goal: Wound healing without signs and symptoms of infection Outcome: Progressing Goal: Risk for impaired skin integrity will decrease Outcome: Progressing   Problem: Education: Goal: Will demonstrate proper wound care and an understanding of methods to prevent future damage Outcome: Progressing Goal: Knowledge of disease or condition will improve Outcome: Progressing Goal: Knowledge of the prescribed therapeutic regimen will improve Outcome: Progressing Goal: Individualized Educational Video(s) Outcome: Progressing   Problem: Activity: Goal: Risk for activity intolerance will decrease Outcome: Progressing   Problem: Cardiac: Goal: Will achieve and/or maintain hemodynamic stability Outcome: Progressing   Problem: Respiratory: Goal: Respiratory status will improve Outcome: Progressing   Problem: Skin Integrity: Goal: Wound healing without signs and symptoms of infection Outcome: Progressing Goal: Risk for impaired skin integrity will decrease Outcome: Progressing   Problem: Education: Goal: Understanding of CV disease, CV risk reduction, and recovery process will improve Outcome: Progressing Goal: Individualized Educational Video(s) Outcome: Progressing   Problem: Activity: Goal: Ability to return to baseline activity level will improve Outcome: Progressing   Problem: Cardiovascular: Goal: Ability to achieve and maintain adequate cardiovascular perfusion will improve Outcome: Progressing Goal: Vascular access site(s) Level 0-1 will be maintained Outcome: Progressing   Problem: Health Behavior/Discharge Planning: Goal: Ability to safely manage health-related needs  after discharge will improve Outcome: Progressing

## 2023-12-28 NOTE — TOC Progression Note (Signed)
Transition of Care [facility]) - Progression Note    Patient Details  Name: Vanessa [name] MRN: [id number] Date of Birth: 1951-06-29  [facility] CM/SW Contact  Elliot Cousin, RN Phone Number: [phone] 12/28/2023, 1:27 PM  Clinical Narrative:    HF TOC CM spoke to pt at bedside. States she would consider IP rehab if recommended. Discussed HH (medicare.gov list giving with ratings). [facility] preoperatively arranged. PT evaluation and recommendations pending, IP rehab vs HH. Pt states her mother and sister will be with her after dc to assist with her care until she is back on her feet. Pt states she lives in her home alone.  Will continue to follow for dc needs.    Expected Discharge Plan: IP Rehab Facility Barriers to Discharge: Continued Medical Work up  Expected Discharge Plan and Services   Discharge Planning Services: CM Consult Post Acute Care Choice: IP Rehab Living arrangements for the past 2 months: Single Family Home                           HH Arranged: RN, PT HH Agency: [facility]         Social Determinants of Health (SDOH) Interventions SDOH Screenings   Food Insecurity: No Food Insecurity (11/30/2023)  Housing: Low Risk  (11/30/2023)  Transportation Needs: No Transportation Needs (11/30/2023)  Utilities: Not At Risk (11/30/2023)  Social Connections: Moderately Integrated (11/30/2023)  Tobacco Use: Low Risk  (11/29/2023)    Readmission Risk Interventions     No data to display

## 2023-12-29 DIAGNOSIS — I361 Nonrheumatic tricuspid (valve) insufficiency: Secondary | ICD-10-CM | POA: Diagnosis not present

## 2023-12-29 DIAGNOSIS — R570 Cardiogenic shock: Secondary | ICD-10-CM | POA: Diagnosis not present

## 2023-12-29 DIAGNOSIS — I340 Nonrheumatic mitral (valve) insufficiency: Secondary | ICD-10-CM | POA: Diagnosis not present

## 2023-12-29 DIAGNOSIS — D649 Anemia, unspecified: Secondary | ICD-10-CM

## 2023-12-29 DIAGNOSIS — I950 Idiopathic hypotension: Secondary | ICD-10-CM

## 2023-12-29 DIAGNOSIS — Z952 Presence of prosthetic heart valve: Secondary | ICD-10-CM | POA: Diagnosis not present

## 2023-12-29 DIAGNOSIS — E43 Unspecified severe protein-calorie malnutrition: Secondary | ICD-10-CM

## 2023-12-29 DIAGNOSIS — N179 Acute kidney failure, unspecified: Secondary | ICD-10-CM | POA: Diagnosis not present

## 2023-12-29 DIAGNOSIS — G4700 Insomnia, unspecified: Secondary | ICD-10-CM

## 2023-12-29 LAB — RENAL FUNCTION PANEL
Albumin: 2.3 g/dL — ABNORMAL LOW (ref 3.5–5.0)
Albumin: 2.5 g/dL — ABNORMAL LOW (ref 3.5–5.0)
Anion gap: 11 (ref 5–15)
Anion gap: 11 (ref 5–15)
BUN: 16 mg/dL (ref 8–23)
BUN: 25 mg/dL — ABNORMAL HIGH (ref 8–23)
CO2: 25 mmol/L (ref 22–32)
CO2: 26 mmol/L (ref 22–32)
Calcium: 8 mg/dL — ABNORMAL LOW (ref 8.9–10.3)
Calcium: 8.4 mg/dL — ABNORMAL LOW (ref 8.9–10.3)
Chloride: 99 mmol/L (ref 98–111)
Chloride: 98 mmol/L (ref 98–111)
Creatinine, Ser: 0.97 mg/dL (ref 0.44–1.00)
Creatinine, Ser: 1.37 mg/dL — ABNORMAL HIGH (ref 0.44–1.00)
GFR, Estimated: >60 mL/min (ref >60)
GFR, Estimated: 41 mL/min — ABNORMAL LOW (ref >60)
Glucose, Bld: 104 mg/dL — ABNORMAL HIGH (ref 70–99)
Glucose, Bld: 131 mg/dL — ABNORMAL HIGH (ref 70–99)
Phosphorus: 4.2 mg/dL (ref 2.5–4.6)
Phosphorus: 3.2 mg/dL (ref 2.5–4.6)
Potassium: 3.5 mmol/L (ref 3.5–5.1)
Potassium: 4.2 mmol/L (ref 3.5–5.1)
Sodium: 135 mmol/L (ref 135–145)
Sodium: 135 mmol/L (ref 135–145)

## 2023-12-29 LAB — GLUCOSE, CAPILLARY
Glucose-Capillary: 98 mg/dL (ref 70–99)
Glucose-Capillary: 108 mg/dL — ABNORMAL HIGH (ref 70–99)
Glucose-Capillary: 160 mg/dL — ABNORMAL HIGH (ref 70–99)
Glucose-Capillary: 140 mg/dL — ABNORMAL HIGH (ref 70–99)
Glucose-Capillary: 85 mg/dL (ref 70–99)

## 2023-12-29 LAB — COOXEMETRY PANEL
Carboxyhemoglobin: 2.5 % — ABNORMAL HIGH (ref 0.5–1.5)
Methemoglobin: <0.7 % (ref 0.0–1.5)
O2 Saturation: 72.6 %
Total hemoglobin: 8.6 g/dL — ABNORMAL LOW (ref 12.0–16.0)

## 2023-12-29 LAB — CBC
HCT: 27.8 % — ABNORMAL LOW (ref 36.0–46.0)
Hemoglobin: 8.5 g/dL — ABNORMAL LOW (ref 12.0–15.0)
MCH: 31.5 pg (ref 26.0–34.0)
MCHC: 30.6 g/dL (ref 30.0–36.0)
MCV: 103 fL — ABNORMAL HIGH (ref 80.0–100.0)
Platelets: 152 10*3/uL (ref 150–400)
RBC: 2.7 MIL/uL — ABNORMAL LOW (ref 3.87–5.11)
RDW: 23.5 % — ABNORMAL HIGH (ref 11.5–15.5)
WBC: 7.5 10*3/uL (ref 4.0–10.5)
nRBC: 0.7 % — ABNORMAL HIGH (ref 0.0–0.2)

## 2023-12-29 LAB — PROTIME-INR
INR: 1.5 — ABNORMAL HIGH (ref 0.8–1.2)
Prothrombin Time: 18 s — ABNORMAL HIGH (ref 11.4–15.2)

## 2023-12-29 LAB — MAGNESIUM: Magnesium: 2.6 mg/dL — ABNORMAL HIGH (ref 1.7–2.4)

## 2023-12-29 LAB — HEPARIN LEVEL (UNFRACTIONATED): Heparin Unfractionated: 0.61 [IU]/mL (ref 0.30–0.70)

## 2023-12-29 MED ORDER — MIDODRINE HCL 5 MG PO TABS
20.0000 mg | ORAL_TABLET | Freq: Three times a day (TID) | ORAL | Status: DC
  Administered 2023-12-29 – 2024-01-10 (×36): 20 mg via ORAL
  Filled 2023-12-29 (×36): qty 4

## 2023-12-29 MED ORDER — WARFARIN SODIUM 5 MG PO TABS
5.0000 mg | ORAL_TABLET | Freq: Once | ORAL | Status: AC
  Administered 2023-12-29: 5 mg via ORAL
  Filled 2023-12-29: qty 1

## 2023-12-29 MED ORDER — SERTRALINE HCL 50 MG PO TABS
25.0000 mg | ORAL_TABLET | Freq: Every day | ORAL | Status: DC
  Administered 2023-12-30 – 2024-01-12 (×14): 25 mg via ORAL
  Filled 2023-12-29 (×16): qty 1

## 2023-12-29 MED ORDER — ZINC SULFATE 220 (50 ZN) MG PO CAPS
220.0000 mg | ORAL_CAPSULE | Freq: Every day | ORAL | Status: AC
  Administered 2023-12-30: 220 mg via ORAL
  Filled 2023-12-29: qty 1

## 2023-12-29 MED ORDER — VITAMIN C 500 MG PO TABS
250.0000 mg | ORAL_TABLET | Freq: Two times a day (BID) | ORAL | Status: DC
  Administered 2023-12-29 – 2024-01-12 (×26): 250 mg via ORAL
  Filled 2023-12-29 (×26): qty 1

## 2023-12-29 MED ORDER — RENA-VITE PO TABS
1.0000 | ORAL_TABLET | Freq: Two times a day (BID) | ORAL | Status: DC
  Administered 2023-12-29 – 2024-01-12 (×25): 1 via ORAL
  Filled 2023-12-29 (×25): qty 1

## 2023-12-29 MED ORDER — ENSURE ENLIVE PO LIQD: 237.0000 mL | Freq: Three times a day (TID) | ORAL | Status: DC | PRN

## 2023-12-29 MED ORDER — QUETIAPINE FUMARATE 25 MG PO TABS
25.0000 mg | ORAL_TABLET | Freq: Every day | ORAL | Status: DC
  Administered 2023-12-29 – 2024-01-11 (×14): 25 mg via ORAL
  Filled 2023-12-29 (×14): qty 1

## 2023-12-29 NOTE — Progress Notes (Signed)
Patient ID: EVVA DIN, female   DOB: [birth year], 73 y.o.   MRN: [id number]      Advanced Heart Failure Rounding Note  Cardiologist: Dr. Shirlee Latch  Chief Complaint: Vasoplegia   Subjective:   12/30 S/P MVR (tissue) and TV ring and same day return to OR for tamponade/bleeding. 1/2: Progressive volume overload with poor UOP, CVVH started but ended up re-intubated.   Extubated 12/05/23 1/12: CVVH stopped 1/13: iHD, HD line removed 1/14: CRRT Restarted.  1/17: RHC mean RA 2, PA 47/20, mean PCWP 5, CI 1.74, PVR 8, PAPi 9.  Milrinone 0.125 started. 1/20 CRRT stopped.  1/22 methylene blue for persistent vasoplegia  1/24 tunneled HD cath placed. CRRT restarted  Ambulated in her room with PT yesterday.   Reports fatigue. Not eating much from trays but drinking ensure. CorTrak clogged and removed this am.    Objective:    Weight Range: 74.4 kg Body mass index is 29.06 kg/m.   Vital Signs:   Temp:  [97.3 F (36.3 C)-98.1 F (36.7 C)] 98.1 F (36.7 C) (01/29 0738) Pulse Rate:  [68-97] 73 (01/29 0745) Resp:  [13-62] 54 (01/29 0745) BP: (82-140)/(52-82) 116/66 (01/29 0745) SpO2:  [85 %-100 %] 97 % (01/29 0745) Weight:  [74.4 kg] 74.4 kg (01/29 0500) Last BM Date : 12/28/23  Weight change: Filed Weights   12/27/23 0435 12/28/23 0432 12/29/23 0500  Weight: 74.3 kg 74.4 kg 74.4 kg   Intake/Output:   Intake/Output Summary (Last 24 hours) at 12/29/2023 1001 Last data filed at 12/29/2023 0900 Gross per 24 hour  Intake 996.45 ml  Output 1226.7 ml  Net -230.25 ml    CVP 6 Physical Exam   General:  Fatigued appearing elderly female HEENT: normal Neck: JVP not visible ( R internal jugular dressing and LIJ HD cath) Cor: PMI nondisplaced. Regular rate & rhythm. No rubs, gallops or murmurs. Lungs: clear Abdomen: soft, nontender, nondistended.  Extremities: no cyanosis, clubbing, rash, edema Neuro: alert & orientedx3. Affect flat.   Telemetry   SR 80s  Labs     CBC Recent Labs    12/28/23 0427 12/29/23 0414  WBC 8.0 7.5  HGB 8.5* 8.5*  HCT 27.9* 27.8*  MCV 104.1* 103.0*  PLT 144* 152   Basic Metabolic Panel Recent Labs    40/98/11 0427 12/28/23 1551 12/29/23 0414  NA 132* 134* 135  K 4.2 3.8 3.5  CL 99 100 99  CO2 26 26 25   GLUCOSE 127* 99 104*  BUN 20 18 16   CREATININE 1.04* 0.98 0.97  CALCIUM 7.9* 8.2* 8.0*  MG 2.6*  --  2.6*  PHOS 3.1 1.7* 4.2   Liver Function Tests Recent Labs    12/28/23 1551 12/29/23 0414  ALBUMIN 2.5* 2.3*   No results for input(s): "LIPASE", "AMYLASE" in the last 72 hours. Cardiac Enzymes No results for input(s): "CKTOTAL", "CKMB", "CKMBINDEX", "TROPONINI" in the last 72 hours.  BNP: BNP (last 3 results) Recent Labs    05/26/23 1610 08/26/23 1103 09/22/23 0905  BNP 778.0* 781.0* 926.0*   Other results:  Imaging   No results found.  Medications:    Scheduled Medications:  amiodarone  200 mg Oral BID   ascorbic acid  250 mg Per Tube BID   Chlorhexidine Gluconate Cloth  6 each Topical Daily   darbepoetin (ARANESP) injection - NON-DIALYSIS  100 mcg Subcutaneous Q Tue-1800   feeding supplement  237 mL Oral TID BM   feeding supplement (PROSource TF20)  60 mL Per Tube  BID   fiber supplement (BANATROL TF)  60 mL Per Tube BID   Gerhardt's butt cream   Topical BID   influenza vaccine adjuvanted  0.5 mL Intramuscular Tomorrow-1000   insulin aspart  0-24 Units Subcutaneous Q4H   metoCLOPramide (REGLAN) injection  5 mg Intravenous Q8H   midodrine  20 mg Per Tube Q8H   mirtazapine  7.5 mg Oral QHS   multivitamin  1 tablet Per Tube BID   QUEtiapine  25 mg Per Tube QHS   sertraline  25 mg Per Tube Daily   Warfarin - Pharmacist Dosing Inpatient   Does not apply q1600   zinc sulfate (50mg  elemental zinc)  220 mg Per Tube Daily    Infusions:   prismasol BGK 4/2.5 400 mL/hr at 12/29/23 0325    prismasol BGK 4/2.5 400 mL/hr at 12/29/23 0325   sodium chloride Stopped (12/26/23 1005)    anticoagulant sodium citrate     feeding supplement (VITAL 1.5 CAL) Stopped (12/28/23 1045)   heparin 1,000 Units/hr (12/29/23 0900)   prismasol BGK 2/2.5 dialysis solution 1,500 mL/hr at 12/29/23 0901    PRN Medications: sodium chloride, alteplase, anticoagulant sodium citrate, artificial tears, bisacodyl **OR** bisacodyl, heparin, heparin, HYDROmorphone (DILAUDID) injection, levalbuterol, lidocaine (PF), lidocaine-prilocaine, lip balm, melatonin, ondansetron (ZOFRAN) IV, mouth rinse, oxyCODONE, pentafluoroprop-tetrafluoroeth, [START ON 12/31/2023] pneumococcal 20-valent conjugate vaccine, sodium chloride, sodium chloride, traZODone  Patient Profile   Vanessa [name] is a 73 y.o. with history of nonischemic cardiomyopathy and severe MR s/p Mitral Clip x2 (2020), pHTN, and CKD IIIb.   Assessment/Plan   1. Post Cardiotomy Cardiogenic & septic shock: Bioprosthetic MVR and tricuspid ring 11/29/23 with Dr. Leafy Ro. Pre-op cath with no significant CAD.  Pre-op echo with EF 40-45%, severe MR. Went back to the OR with post-op tamponade for clean-out.  Echo 12/31 showed EF 25-30%, moderate RV dysfunction, s/p bioprosthetic mitral valve replacement with trivial MR and tricuspid valve repair with trivial TR. Worsened on 1/2 and re-intubated in setting of volume overload, oliguric renal failure &  pulmonary hypertension. Extubated on 12/04/22. 1/13 CT C/A/P  moderate hemorrhagic pericardial effusion and 5.3 cm hematoma right heart border.  Repeat echo no tamponade. Requiring CRRT for volume removal.  Failed previous attempts to wean from CRRT. - Post -op echo 12/14/23 EF 35-40% RV mod reduced. Valves stable - RHC 1/17 showed RA 3 PA 47/20 (30) PCWP 5 PAPi 9 CI 1.74 - c/w vasoplegia.  - CO-OX 73%.  - Remains on midodrine 20 TID. Off droxidopa. - CVP 6. - Nephrology planning to stop CRRT today. BP has been soft, if improves planning to see if she can tolerate HD later this week - Continue compression  hose  2. Mitral regurgitation: s/p Mitraclip x 2 1/20. Echo 9/24 showed MV s/p Mitraclip x 2 with mean gradient 6 mmHg and severe mitral regurgitation.  She was not thought to be a candidate for an additional Mitraclip due to lack of room between the two existing clips. S/p MV replacement and Tricuspid repair on 11/29/23 with Dr. Leafy Ro.  - Last echo stable valves.   3. Ventricular ectopy:  - Quiescent now  - Continue amiodarone 200 mg bid   4. Pulmonary hypertension: Pre-op RHC (11/24) with PVR 4.5, with moderate mixed pulmonary arterial and pulmonary venous hypertension likely due to mitral valve regurgitation. Now s/p MVR, PA pressure severely elevated post-op.  Suspect still mixed PAH/PVH with elevated PCWP.  NO was tried at 10 ppm but did not have much effect  so now off.  Will hold off on pulmonary vasodilators; small clinical trials demonstrate no improvement and some markers of harm with PDE5i in PH in the setting of mitral valve disease. RHC 1/17 with mild pulmonary arterial hypertension.   5. 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 TSH was normal in 8/24. Follows with Endocrinology  6. Acute renal failure:  1/14 CRRT restarted. CRRT stopped 1/20 and restarted 1/22. Remains anuric.  - Remains anuric. CVP 6. TDC in place.  - CRRT to stop today. Now that off pressors would like to see her transition to [facility], however may not be able to tolerate with current BP.  - Nephrology following.    7. Blood loss anemia - worsening be renal failure - 1u RBCs01/27 for hgb 7.2, 8.5 today  8. Complete heart block: She had post-op CHB. Resolved.   9. Hypoxic / Hypercapnic respiratory failure: Reintubated 01/02.  Extubated 01/05.  - Now on RA to 3L [place] with stable sats.   10. Paroxsymal Atrial fibrillation:  - In SR on po amio.   - On Heparin drip.  - Warfarin dosing d/w pharmacy. INR 1.5.  11. ID: Suspected septic shock component, no definite source. All lines changed out and  started on linezolid/meropenem.  Afebrile. Leukocytosis resolved  - Now off abx  12. Thrombocytopenia: Suspect due to sepsis. Platelets trending back up 152K today.   13. Deconditioning - Continue PT  14. Protein-calorie malnutrition - Cor-trak clotted and was removed this morning - Encouraged po intake.    Kinzee Happel N, PA-C 12/29/23  Advanced Heart Failure Team Pager [phone] (M-F; 7a - 5p)  Please contact [facility] for night-coverage after hours (4p -7a ) and weekends on [email or web address]

## 2023-12-29 NOTE — Procedures (Signed)
I was present at this CRRT session. I have reviewed the session itself and made appropriate changes. Will stop CRRT this morning and see how she does.  Hopefully her bp will improve so she can tolerate IHD, if not will need to resume CRRT.   Vital signs in last 24 hours:  Temp:  [97.3 F (36.3 C)-98.1 F (36.7 C)] 98.1 F (36.7 C) (01/29 0738) Pulse Rate:  [68-97] 73 (01/29 0745) Resp:  [13-62] 54 (01/29 0745) BP: (82-140)/(52-82) 116/66 (01/29 0745) SpO2:  [85 %-100 %] 97 % (01/29 0745) Weight:  [74.4 kg] 74.4 kg (01/29 0500) Weight change: 0 kg Filed Weights   12/27/23 0435 12/28/23 0432 12/29/23 0500  Weight: 74.3 kg 74.4 kg 74.4 kg    Recent Labs  Lab 12/29/23 0414  NA 135  K 3.5  CL 99  CO2 25  GLUCOSE 104*  BUN 16  CREATININE 0.97  CALCIUM 8.0*  PHOS 4.2    Recent Labs  Lab 12/27/23 2254 12/28/23 0427 12/29/23 0414  WBC 8.2 8.0 7.5  HGB 8.4* 8.5* 8.5*  HCT 27.7* 27.9* 27.8*  MCV 105.3* 104.1* 103.0*  PLT 147* 144* 152    Scheduled Meds:  amiodarone  200 mg Oral BID   ascorbic acid  250 mg Per Tube BID   Chlorhexidine Gluconate Cloth  6 each Topical Daily   darbepoetin (ARANESP) injection - NON-DIALYSIS  100 mcg Subcutaneous Q Tue-1800   feeding supplement  237 mL Oral TID BM   feeding supplement (PROSource TF20)  60 mL Per Tube BID   fiber supplement (BANATROL TF)  60 mL Per Tube BID   Gerhardt's butt cream   Topical BID   influenza vaccine adjuvanted  0.5 mL Intramuscular Tomorrow-1000   insulin aspart  0-24 Units Subcutaneous Q4H   metoCLOPramide (REGLAN) injection  5 mg Intravenous Q8H   midodrine  20 mg Per Tube Q8H   mirtazapine  7.5 mg Oral QHS   multivitamin  1 tablet Per Tube BID   QUEtiapine  25 mg Per Tube QHS   sertraline  25 mg Per Tube Daily   Warfarin - Pharmacist Dosing Inpatient   Does not apply q1600   zinc sulfate (50mg  elemental zinc)  220 mg Per Tube Daily   Continuous Infusions:   prismasol BGK 4/2.5 400 mL/hr at 12/29/23  0325    prismasol BGK 4/2.5 400 mL/hr at 12/29/23 0325   sodium chloride Stopped (12/26/23 1005)   anticoagulant sodium citrate     feeding supplement (VITAL 1.5 CAL) Stopped (12/28/23 1045)   heparin 1,000 Units/hr (12/29/23 0800)   prismasol BGK 2/2.5 dialysis solution 1,500 mL/hr at 12/29/23 0548   PRN Meds:.sodium chloride, alteplase, anticoagulant sodium citrate, artificial tears, bisacodyl **OR** bisacodyl, heparin, heparin, HYDROmorphone (DILAUDID) injection, levalbuterol, lidocaine (PF), lidocaine-prilocaine, lip balm, melatonin, ondansetron (ZOFRAN) IV, mouth rinse, oxyCODONE, pentafluoroprop-tetrafluoroeth, [START ON 12/31/2023] pneumococcal 20-valent conjugate vaccine, sodium chloride, sodium chloride, traZODone   Irena Cords,  MD 12/29/2023, 8:58 AM

## 2023-12-29 NOTE — Progress Notes (Signed)
Occupational Therapy Treatment Patient Details Name: SKYLENE DEREMER MRN: [id number] DOB: 04-09-1951 Today's Date: 12/29/2023   History of present illness 73 yo F admitted 12/30 with DOE, increasing MR and TR. LCH without CAD. 12/30 MVR and tricuspid valve repair. Intubated 1/2-1/5. 1/2 CRRT started/stopped on 1/12. Pt HD line was removed on 1/13 and CRRT was re-started on 1/14-1/20, CRRT restarted on 1/22 and stopped 1/29.  PMhx: HTN, NICM, HFrEF (EF40-45%), Pulmonary HTN, CKD and  severe MR s/p prior mitral clip x 2 in 2020.   OT comments  OT session focused on training in techniques for increased safety and independence with UB ADLs and bed mobility while adhering to sternal precautions and in therapeutic exercises performed within sternal precautions sitting EOB and supine in bed with HOB elevated (see details below) to increase strength, activity tolerance, and balance for carryover to functional tasks. Pt currently demonstrates ability to complete UB ADLs with Set up to Min assist, LB ADLs with Max assist, and bed mobility with Contact guard to Mod assist, all while adhering to sternal precautions. Pt continues to present with decreased activity tolerance and decreased cognition but with noted improvements in each this session as compared to last skilled OT session. Pt with brief episode of VTach while sitting EOB during grooming tasks. VS otherwise stable on 3L continuous O2 through nasal cannula throughout session with HR in the 80s and O2 sat >/96%. Pt participated well in session, is making progress toward goals, and is motivated to increase independence. Pt will benefit from continued acute skilled OT services to address deficits outlined below and increase safety and independence with functional tasks. Post acute discharge, pt will benefit from intensive inpatient skilled rehab services > 3 hours per day to maximize rehab potential.       If plan is discharge home, recommend the following:   Two people to help with walking and/or transfers;A lot of help with bathing/dressing/bathroom;Assistance with cooking/housework;Assist for transportation;Help with stairs or ramp for entrance   Equipment Recommendations  Other (comment) (defer to next level of care)    Recommendations for Other Services Rehab consult    Precautions / Restrictions Precautions Precautions: Sternal;Fall;Other (comment) Precaution Booklet Issued: No Precaution Comments: Sternal precautions reviewed verbally with pt demonstrating good carryover of training from prior sessions this day (1/29) Restrictions Weight Bearing Restrictions Per Provider Order:  (sternal precautions) RUE Weight Bearing Per Provider Order: Non weight bearing LUE Weight Bearing Per Provider Order: Non weight bearing Other Position/Activity Restrictions: sternal precautions       Mobility Bed Mobility Overal bed mobility: Needs Assistance Bed Mobility: Supine to Sit, Sit to Sidelying     Supine to sit: Contact guard, HOB elevated   Sit to sidelying: Mod assist General bed mobility comments: adhering to sternal precautions; with min cues for technique; with increased time; Mod assist to elevate B LE into bed    Transfers Overall transfer level: Needs assistance Equipment used: None Transfers: Bed to chair/wheelchair/BSC            Lateral/Scoot Transfers: Mod assist, Max assist General transfer comment: Pt performing lateral/scoot toward HOB at EOB with Mod-Max assist with cues for technique while adhering to sternal precautions     Balance Overall balance assessment: Needs assistance Sitting-balance support: Single extremity supported, No upper extremity supported, Feet supported Sitting balance-Leahy Scale: Good Sitting balance - Comments: close Supervision for safety with pt sitting EOB but no LOB noted this session  ADL either performed or assessed with clinical  judgement   ADL Overall ADL's : Needs assistance/impaired Eating/Feeding: Set up;Sitting   Grooming: Set up;Sitting   Upper Body Bathing: Minimal assistance;Sitting;Cueing for compensatory techniques (adhering to sternal precautions) Upper Body Bathing Details (indicate cue type and reason): simulated sitting EOB     Upper Body Dressing : Minimal assistance;Cueing for compensatory techniques;Sitting (adhering to sternal precautions)                     General ADL Comments: OT educated pt in compensatory stratagies for increased safety and independence with UB ADLs while adhering to sternal precautions. Pt verbalized and demonstrated understanding through teach back. Pt will benefit from reinforcment of education. Pt conitnues to presetn with decreased activity tolerance, but with significant improvement in activity tolerance noted this session as compared to last skilled OT session.    Extremity/Trunk Assessment Upper Extremity Assessment Upper Extremity Assessment: Generalized weakness (tested within sternal precautions)   Lower Extremity Assessment Lower Extremity Assessment: Generalized weakness        Vision       Perception     Praxis      Cognition Arousal: Alert Behavior During Therapy: WFL for tasks assessed/performed, Flat affect Overall Cognitive Status: Within Functional Limits for tasks assessed Area of Impairment: Problem solving, Awareness, Safety/judgement, Attention                   Current Attention Level: Selective     Safety/Judgement: Decreased awareness of safety, Decreased awareness of deficits (noted improvements in awareness of deficits as compared to last skilled OT session) Awareness: Emergent Problem Solving: Slow processing General Comments: Pt continues to present with cognitive deficits but with noted improvement in cognition, motivation, and overall mood since last skilled OT session. Pt also with improved carryover of  prior education in sternal precautions with pt able to state precautions Independently and requiring only minimal cues to adhere to precautions during session.        Exercises Exercises: General Upper Extremity, General Lower Extremity, Hand exercises General Exercises - Upper Extremity Shoulder Flexion: AROM, Strengthening, Both, 10 reps, Supine (with HOB elevated; AROM to shoulder level; increased activity tolerance) Shoulder Extension: AROM, Strengthening, Both, 10 reps, Supine (with HOB elevated; increased activity tolerance) Elbow Flexion: AROM, Strengthening, Both, 10 reps, Supine (with HOB elevated; increased activity tolerance) Elbow Extension: AROM, Strengthening, Both, 10 reps, Supine (with HOB elevated; increased activity tolerance) Digit Composite Flexion: AROM, Strengthening, Both, 10 reps, Supine (with HOB elevated; increased activity tolerance) Composite Extension: AROM, Strengthening, Both, 10 reps, Supine (with HOB elevated; increased activity tolerance) General Exercises - Lower Extremity Hip ABduction/ADduction: AROM, Both, 10 reps, Seated (at EOB; increased activity tolerance; increased balance) Straight Leg Raises: AROM, Strengthening, Both, 10 reps, Seated (at EOB; increased activity tolerance; increased balance) Hip Flexion/Marching: AROM, Strengthening, Both, 10 reps, Seated (at EOB; increased activity tolerance; increased balance) Hand Exercises Forearm Supination: AROM, Strengthening, Both, 10 reps, Supine (with HOB elevated; increased activity tolerance) Forearm Pronation: AROM, Strengthening, Both, 10 reps, Supine (with HOB elevated; increased activity tolerance) Digit Lifts: AROM, Strengthening, Both, 10 reps, Supine (with HOB elevated; increased activity tolerance) Opposition: AROM, Strengthening, Both, 10 reps, Supine (with HOB elevated; increased activity tolerance)    Shoulder Instructions       General Comments Pt with brief episode of VTach while sitting  EOB during grooming tasks. VS otherwise stable on 3L continuous O2 through nasal cannula throughout session with HR in the 80s  and O2 sat >/96%. RN present during a portion of session.    Pertinent Vitals/ Pain       Pain Assessment Pain Assessment: No/denies pain Pain Intervention(s): Monitored during session  Home Living                                          Prior Functioning/Environment              Frequency  Min 1X/week        Progress Toward Goals  OT Goals(current goals can now be found in the care plan section)  Progress towards OT goals: Progressing toward goals  Acute Rehab OT Goals Patient Stated Goal: to get stronger and return home  Plan      Co-evaluation                 AM-PAC OT "6 Clicks" Daily Activity     Outcome Measure   Help from another person eating meals?: A Little Help from another person taking care of personal grooming?: A Little Help from another person toileting, which includes using toliet, bedpan, or urinal?: A Lot Help from another person bathing (including washing, rinsing, drying)?: A Lot Help from another person to put on and taking off regular upper body clothing?: A Little Help from another person to put on and taking off regular lower body clothing?: A Lot 6 Click Score: 15    End of Session Equipment Utilized During Treatment: Oxygen  OT Visit Diagnosis: Muscle weakness (generalized) (M62.81);Other (comment) (decreased activity tolerance)   Activity Tolerance Patient tolerated treatment well   Patient Left in bed;with call bell/phone within reach;with nursing/sitter in room   Nurse Communication Mobility status;Other (comment) (pt with brief episode of VTach while sitting EOB)        Time: 4098-1191 OT Time Calculation (min): 48 min  Charges: OT General Charges $OT Visit: 1 Visit OT Treatments $Self Care/Home Management : 23-37 mins $Therapeutic Exercise: 8-22 mins  Habiba Treloar "Orson Eva., OTR/L, MA Acute Rehab [phone]   Lendon Colonel 12/29/2023, 3:47 PM

## 2023-12-29 NOTE — Progress Notes (Signed)
Brief Nutrition Note:   RN contacted RD regarding Cortrak being clogged. TF held yesterday morning before PT and not resumed; noted meds were given around 10 pm per tube but then RN attempted to utilized tube at 5am and Cortrak would not flush.  TF held yesterday to evaluate if pt would eat more with a complete break from TF. Pt reports she did not eat any lunch or dinner yesterday but did drink 50% of an Ensure last night. This morning pt drank 50% of Ensure but did not eat breakfast. Pt reports she is not planning to eat any solid food today. Pt does however indicate that she likes the chocolate Ensure and willing to continue to drink which is progress. Pt did not try the chocolate Magic Cup yesterday and does not like Chocolate Mighty Shake.   RD discussed nutrition poc with PCCM. Cortrak team attempted to unclog Cortrak but unsuccessful. Cortrak removed as non-functioning. Pt still is not eating adequately but is starting to drink some Ensure.   Cortrak tube has been in place since 1/03, nocturnal TF attempted but did not improve appetite. While on po diet, pt has eaten minimally. Pt previously not wanting to take oral nutrition supplements but is now agreeable to Ensure (started drinking yesterday) and trying the Chocolate Magic Cup.   After discussion with PCCM, plan to not replace Cortrak today, give pt a break from the Cortrak and encourage oral intake over the next 48 hours. If intake does not improve by then, plan to reassess Cortrak replacement on 1/31. Continue calorie count. Encouraged small, frequent meals and sips of Ensure throughout the day, even with meds.   Romelle Starcher MS, RDN, LDN, CNSC Registered Dietitian 3 Clinical Nutrition RD Inpatient Contact Info in Amion

## 2023-12-29 NOTE — Progress Notes (Signed)
Cortrak Tube Team Note:  Consult received for clogged Cortrak. Request to unclog tube.   Cortrak RD attempted to unclog Cortrak but unsuccessful. Cortrak poc with PCCM. Plan to remove Cortrak and bridle given Cortrak non-functional. Plan to NOT replace at this time. PCCM to re-consult if Cortrak replacement desired.   X-ray is required, abdominal x-ray has been ordered by the Cortrak team. Please confirm tube placement before using the Cortrak tube.   If the tube becomes dislodged please keep the tube and contact the Cortrak team at [email or web address] for replacement.  If after hours and replacement cannot be delayed, place a NG tube and confirm placement with an abdominal x-ray.    Romelle Starcher MS, RDN, LDN, CNSC Registered Dietitian 3 Clinical Nutrition RD Inpatient Contact Info in Amion

## 2023-12-29 NOTE — Progress Notes (Addendum)
NAME:  Vanessa [name], MRN:  [id number], DOB:  16-Mar-1951, LOS: 30 ADMISSION DATE:  11/29/2023, CONSULTATION DATE:  11/29/23 REFERRING MD: Leafy Ro - TCTS, CHIEF COMPLAINT: Postoperative management  History of Present Illness:  72yoF with PMH significant for HTN, NICM, HFrEF (EF40-45%), PHTN, CKD and hx of prior severe MR s/p prior mitral clip x 2 in 2020.  Pt with progressive DOE found to have increasing MR and moderate TR despite adjustments in GDMT.  Not felt to be candidate for additional mitral clip.  Repeat LHC without CAD but showed moderate PHTN.  Underwent mitral valve replacement with tissue valve and tricuspid valve repair by Dr. Leafy Ro on 12/30.  PCCM consulted to assist with vent and medical management post-operatively in ICU.   Pertinent Medical History:  Never smoker, MR w/ previous Mitral clip x 2 (2020), NICM, HFrEF, PHTN, HTN, CKD3b  Significant Hospital Events: Including procedures, antibiotic start and stop dates in addition to other pertinent events   12/30 MVR by Dr. Leafy Ro; relook for tamponade/washout later in evening without obvious source 1/2 Intubated for respiratory decompensation, iNO started. Vanc, meropenem started. CRRT started.  1/5 Extubated 1/8 Converted to NSR overnight 1/9 Back to A.fib around 0200 1/11 R introducer out.  Unable to thread right subclavian catheter 1/12 Remains weak still pressor dependent 1/13 Repeat BC sent. Still on pressors, Zosyn changed to meropenem.  Chest abdomen and pelvis CT obtained : Moderate hemorrhagic pericardial effusion, 5.3 cm hematoma right thrombus. Arterial gas bilateral patchy opacities mild dependent posterior right upper lobe atelectasis was unremarkable layering gallstones; neg for tamponade on repeat Echo 1/14 New right IJ HD catheter placed. Echo obtained to better evaluate her EF is 35 to 40%.  The LV demonstrates regional wall motion abnormalities there is asymmetric left ventricular hypertrophy of the inferior  lateral segment RV systolic function moderate regular reduced left atrial wall dilated no mention of clot.  1/15 Still on fairly high-dose norepinephrine 1/16 Cont CRRT + low dose NE  1/17 RHC confirms low output state, restarted on milrinone 1/20 CRRT stopped 1/21: D/c milrinone yesterday, ongoing increase NE requirements during nights. Started droxidopa in addition to midodrine to aid off NE.  1/22: Levo up to 18 overnight (max so far), cvp 9, restarting crrt today. Received dose methylene blue, Cortisol 21 1/23: Levo down to 9, CVP 8 1/24: Tunneled HD and tunneled dual lumen LIJ CVC placed  1/27: 1U PRBC given for Hgb 7.2 and hypotension. Off levo.   Interim History / Subjective:  No significant overnight events Feels tired this morning, we discussed possible lingering effect from PM sleep aids Overall feels ok otherwise Plan to stop CRRT today, monitor Likely iHD tomorrow vs. Friday pending trajectory of labs/clinical status/BP Remains off of IV vasopressors  Objective:  Blood pressure 139/76, pulse 75, temperature (!) 97.5 F (36.4 C), temperature source Axillary, resp. rate (!) 38, height 5\' 3"  (1.6 m), weight 74.4 kg, SpO2 98%. CVP:  [1 mmHg-12 mmHg] 12 mmHg      Intake/Output Summary (Last 24 hours) at 12/29/2023 0723 Last data filed at 12/29/2023 0700 Gross per 24 hour  Intake 1507.69 ml  Output 1516.3 ml  Net -8.61 ml   Filed Weights   12/27/23 0435 12/28/23 0432 12/29/23 0500  Weight: 74.3 kg 74.4 kg 74.4 kg   Physical Examination: General: Acute-on-chronically ill-appearing older woman in NAD. Pleasant affect. HEENT: [place]/AT, anicteric sclera, PERRL, moist mucous membranes. Cortrak in place. Neuro: Awake but mildly drowsy, oriented x 4. Responds to  verbal stimuli. Following commands consistently. Moves all 4 extremities spontaneously. Strength 4/5 in all 4 extremities.  CV: RRR, +II-III/VI systolic murmur. PULM: Breathing even and unlabored on 3LNC. Lung fields CTAB,  diminished at bilateral bases. GI: Soft, nontender, nondistended. Normoactive bowel sounds. Extremities: No significant LE edema noted. Skin: Warm/dry, no rashes.  Resolved Hospital Problem List:   Postoperative vent management Postop tamponade resolved after relook and washout on day 1  Assessment & Plan:   Severe mitral regurg s/p prior mitral clip, now s/p MVR Moderate tricuspid regurgitation s/p TV repair Persistent cardioplegia postoperatively- confirmed on RHC pHTN AoC HFrEF NICM PAF, flutter  S/p MVR and tricuspid ring 12/30 w/ Weldner. Back to OR for tamponade. Decompensated requiring intubation for FVO, renal failure, pHTN. 1/17 RHC with low filling pressures and started on milrinone, subsequently off. S/p methylene blue 1/22. - Management per AHF, appreciate assistance - Continue amiodarone - Heparin gtt with warfarin bridge 1/15, INR 1.5 - SBP goal > - Continue midodrine 20mg  TID, droxidopa 200mg  TID - Remains off of IV vasopressors - Trend CVP/Co-ox  Acute renal failure on CKD III, on CRRT CRRT started 1/14, stopped 1/20, restarted 1/22. - Nephro following, appreciate assistance - CRRT discontinued today 1/29, plan for iHD 1/30 vs. 1/31 pending lab trajectory - Trend BMP - Replete electrolytes as indicated - Monitor I&Os, remains anuric - Avoid nephrotoxic agents as able - Ensure adequate renal perfusion  R/o Sepsis,  unclear source Shock resolved Pan scan 1/13 with patchy lower lobe opacities, no AP findings.  No definite source. Lines were replaced 1/24.  Off of meropenem since 1/25. BC and UC neg 1/13. - SBP goal > - Remains off of vasopressors - Trend WBC, fever curve - F/u Cx data - Monitor off of antibiotics, slow clinical improvement  Acute on chronic anemia Thrombocytopenia, potentially due to antibiotics, doubt sepsis, improving - Trend H&H, Plt - Monitor for signs of active bleeding - Transfuse for Hgb < 7.0 or hemodynamically  significant bleeding - Last transfusion 1/27 (1U PRBCs)  Hyperglycemia/ prediabetes A1c 6 11/25/2023. - SSI - CBGs Q4H - Goal CBG 140-180  Goiter Off of methimazole, TSH WNL. - Will require outpatient Endocrinology follow up  Situational depression Sleep disturbance - Continue Zoloft - Continue Seroquel at bedtime, melatonin, trazodone PRN, mirtazapine as above  Deep tissue injury on left buttocks approximately 2 cm in diameter purple discoloration - WOC consulted - High protein diet to promote wound healing  Protein calorie malnutrition, severe Early satiety - TF via Cortrak, appreciate RD assistance - Cortrak clogged 1/29, attempt to declog and if unable will remove and give ~48H hiatus - Mirtazapine for appetite stimulation - Continue Reglan  Physical deconditioning  - PT/OT, hopeful to encourage further mobility now that CRRT is discontinued  Best Practice (right click and "Reselect all SmartList Selections" daily)   Diet/type: Encourage PO, no appetite, supplement TF DVT prophylaxis systemic heparin/ warfarin Pressure ulcer(s): L buttocks  GI prophylaxis: N/A Lines: HD cath 1/24, tunneled CVC 1/24 Foley:  N/A Code Status:  full code Last date of multidisciplinary goals of care discussion [per primary]  Critical care time:   The patient is critically ill with multiple organ system failure and requires high complexity decision making for assessment and support, frequent evaluation and titration of therapies, advanced monitoring, review of radiographic studies and interpretation of complex data.   Critical Care Time devoted to patient care services, exclusive of separately billable procedures, described in this note is 49  minutes.  Tim Lair, PA-C [facility] 12/29/23 7:23 AM  Please see [email or web address] for pager details.  From 7A-7P if no response, please call [phone] After hours, please call ELink [phone]

## 2023-12-29 NOTE — Progress Notes (Addendum)
PHARMACY - ANTICOAGULATION CONSULT NOTE  Pharmacy Consult for IV heparin + warfarin Indication: atrial fibrillation  No Known Allergies  Patient Measurements: Height: 5\' 3"  (160 cm) Weight: 74.4 kg (164 lb 0.4 oz) IBW/kg (Calculated) : 52.4 Heparin Dosing Weight: ~ 70 kg  Vital Signs: Temp: 98.1 F (36.7 C) (01/29 0738) Temp Source: Oral (01/29 0738) BP: 95/61 (01/29 1100) Pulse Rate: 80 (01/29 1100)  Labs: Recent Labs    12/27/23 0438 12/27/23 1528 12/27/23 2254 12/28/23 0427 12/28/23 1551 12/29/23 0414  HGB 7.2*  --  8.4* 8.5*  --  8.5*  HCT 24.5*  --  27.7* 27.9*  --  27.8*  PLT 144*  --  147* 144*  --  152  LABPROT 15.5*  --   --  16.5*  --  18.0*  INR 1.2  --   --  1.3*  --  1.5*  HEPARINUNFRC 0.61  --   --  0.74* 0.54 0.61  CREATININE 1.03*   < >  --  1.04* 0.98 0.97   < > = values in this interval not displayed.    Estimated Creatinine Clearance: 50.6 mL/min (by C-G formula based on SCr of 0.97 mg/dL).  Warfarin doses received (INR): 1/24 - 2.5 mg, 1.2 1/25 - 2.5 mg, 1.1 1/26 - 5 mg, 1.2 1/27 - 5 mg, 1.3   Assessment: 73 yo female s/p scheduled bioprosthetic mitral valve replacement with tissue valve 12/30 now with afib, pharmacy asked to dose anticoagulation with IV heparin. Spontaneously converted 1/7; flipped back to AF overnight 1/9 0300. Baseline INR 1.1, slow uptrend to 1.3 s/p 6 days warfarin therapy, total dose 20 mg, then held 1/21-24 for tunneled access plans. Patient now s/p [facility] and tunneled central line placement 1/24 AM on CRRT, long-term dialysis plan pending.  CBC remains low-stable, off norepinephrine since 1/26, remains on midodrine. Resumed anticoagulation per VIR after tunneled cath placement.  Heparin level is therapeutic at 0.61, on 1000 units/hr. Hgb 8.5, plt 152. INR is subtherapeutic at 1.5. Minimal oral intake - received 1 ensure yesterday, cortrak out now. No s/sx of bleeding or infusion issues.   Goal of Therapy:  Heparin level  0.3 - 0.7 units INR goal 2 - 3 (bMVR) Monitor platelets by anticoagulation protocol: Yes   Plan:  Continue IV heparin at 1000 units/hr Continue warfarin 5 mg x1 Daily heparin level, CBC and INR.  Thank you for allowing pharmacy to participate in this patient's care,  Sherron Monday, PharmD, BCCCP Clinical Pharmacist  Phone: [phone] 12/29/2023 11:38 AM  Please check AMION for all [facility] Pharm[facility] phone numbers After 10:00 PM, call Main Pharmacy [phone]

## 2023-12-30 DIAGNOSIS — Z952 Presence of prosthetic heart valve: Secondary | ICD-10-CM | POA: Diagnosis not present

## 2023-12-30 DIAGNOSIS — R570 Cardiogenic shock: Secondary | ICD-10-CM | POA: Diagnosis not present

## 2023-12-30 DIAGNOSIS — G4700 Insomnia, unspecified: Secondary | ICD-10-CM | POA: Diagnosis not present

## 2023-12-30 DIAGNOSIS — D649 Anemia, unspecified: Secondary | ICD-10-CM | POA: Diagnosis not present

## 2023-12-30 DIAGNOSIS — N179 Acute kidney failure, unspecified: Secondary | ICD-10-CM | POA: Diagnosis not present

## 2023-12-30 DIAGNOSIS — I950 Idiopathic hypotension: Secondary | ICD-10-CM

## 2023-12-30 DIAGNOSIS — I5023 Acute on chronic systolic (congestive) heart failure: Secondary | ICD-10-CM | POA: Diagnosis not present

## 2023-12-30 DIAGNOSIS — I071 Rheumatic tricuspid insufficiency: Secondary | ICD-10-CM

## 2023-12-30 LAB — COOXEMETRY PANEL
Carboxyhemoglobin: 2.7 % — ABNORMAL HIGH (ref 0.5–1.5)
Methemoglobin: 0.7 % (ref 0.0–1.5)
O2 Saturation: 81.5 %
Total hemoglobin: 8.8 g/dL — ABNORMAL LOW (ref 12.0–16.0)

## 2023-12-30 LAB — MAGNESIUM: Magnesium: 2.9 mg/dL — ABNORMAL HIGH (ref 1.7–2.4)

## 2023-12-30 LAB — RENAL FUNCTION PANEL
Albumin: 2.5 g/dL — ABNORMAL LOW (ref 3.5–5.0)
Anion gap: 8 (ref 5–15)
BUN: 33 mg/dL — ABNORMAL HIGH (ref 8–23)
CO2: 27 mmol/L (ref 22–32)
Calcium: 8.5 mg/dL — ABNORMAL LOW (ref 8.9–10.3)
Chloride: 99 mmol/L (ref 98–111)
Creatinine, Ser: 2.11 mg/dL — ABNORMAL HIGH (ref 0.44–1.00)
GFR, Estimated: 24 mL/min — ABNORMAL LOW (ref >60)
Glucose, Bld: 101 mg/dL — ABNORMAL HIGH (ref 70–99)
Phosphorus: 4.5 mg/dL (ref 2.5–4.6)
Potassium: 4.6 mmol/L (ref 3.5–5.1)
Sodium: 134 mmol/L — ABNORMAL LOW (ref 135–145)

## 2023-12-30 LAB — CBC
HCT: 26.6 % — ABNORMAL LOW (ref 36.0–46.0)
Hemoglobin: 8 g/dL — ABNORMAL LOW (ref 12.0–15.0)
MCH: 31.6 pg (ref 26.0–34.0)
MCHC: 30.1 g/dL (ref 30.0–36.0)
MCV: 105.1 fL — ABNORMAL HIGH (ref 80.0–100.0)
Platelets: 157 10*3/uL (ref 150–400)
RBC: 2.53 MIL/uL — ABNORMAL LOW (ref 3.87–5.11)
RDW: 22.8 % — ABNORMAL HIGH (ref 11.5–15.5)
WBC: 8 10*3/uL (ref 4.0–10.5)
nRBC: 0.5 % — ABNORMAL HIGH (ref 0.0–0.2)

## 2023-12-30 LAB — GLUCOSE, CAPILLARY
Glucose-Capillary: 106 mg/dL — ABNORMAL HIGH (ref 70–99)
Glucose-Capillary: 109 mg/dL — ABNORMAL HIGH (ref 70–99)
Glucose-Capillary: 110 mg/dL — ABNORMAL HIGH (ref 70–99)
Glucose-Capillary: 127 mg/dL — ABNORMAL HIGH (ref 70–99)
Glucose-Capillary: 87 mg/dL (ref 70–99)
Glucose-Capillary: 95 mg/dL (ref 70–99)
Glucose-Capillary: 96 mg/dL (ref 70–99)

## 2023-12-30 LAB — PROTIME-INR
INR: 1.7 — ABNORMAL HIGH (ref 0.8–1.2)
Prothrombin Time: 19.7 s — ABNORMAL HIGH (ref 11.4–15.2)

## 2023-12-30 LAB — HEPARIN LEVEL (UNFRACTIONATED): Heparin Unfractionated: 0.52 [IU]/mL (ref 0.30–0.70)

## 2023-12-30 MED ORDER — OXYCODONE HCL 5 MG PO TABS
5.0000 mg | ORAL_TABLET | ORAL | Status: DC | PRN
Start: 2023-12-30 — End: 2024-01-12
  Filled 2023-12-30: qty 1

## 2023-12-30 MED ORDER — TRAZODONE HCL 100 MG PO TABS
100.0000 mg | ORAL_TABLET | Freq: Every evening | ORAL | Status: DC | PRN
  Administered 2024-01-03: 100 mg via ORAL
  Filled 2023-12-30: qty 1

## 2023-12-30 MED ORDER — MELATONIN 3 MG PO TABS
3.0000 mg | ORAL_TABLET | Freq: Every evening | ORAL | Status: DC | PRN
  Administered 2023-12-31 – 2024-01-03 (×3): 3 mg via ORAL
  Filled 2023-12-30 (×3): qty 1

## 2023-12-30 MED ORDER — WARFARIN SODIUM 3 MG PO TABS
6.0000 mg | ORAL_TABLET | Freq: Once | ORAL | Status: AC
  Administered 2023-12-30: 6 mg via ORAL
  Filled 2023-12-30: qty 2

## 2023-12-30 NOTE — Progress Notes (Signed)
NAME:  Vanessa [name], MRN:  [id number], DOB:  05-24-1951, LOS: 31 ADMISSION DATE:  11/29/2023, CONSULTATION DATE:  11/29/23 REFERRING MD: Leafy Ro - TCTS, CHIEF COMPLAINT: Postoperative management  History of Present Illness:  72yoF with PMH significant for HTN, NICM, HFrEF (EF40-45%), PHTN, CKD and hx of prior severe MR s/p prior mitral clip x 2 in 2020.  Pt with progressive DOE found to have increasing MR and moderate TR despite adjustments in GDMT.  Not felt to be candidate for additional mitral clip.  Repeat LHC without CAD but showed moderate PHTN.  Underwent mitral valve replacement with tissue valve and tricuspid valve repair by Dr. Leafy Ro on 12/30.  PCCM consulted to assist with vent and medical management post-operatively in ICU.   Pertinent Medical History:  Never smoker, MR w/ previous Mitral clip x 2 (2020), NICM, HFrEF, PHTN, HTN, CKD3b  Significant Hospital Events: Including procedures, antibiotic start and stop dates in addition to other pertinent events   12/30 MVR by Dr. Leafy Ro; relook for tamponade/washout later in evening without obvious source 1/2 Intubated for respiratory decompensation, iNO started. Vanc, meropenem started. CRRT started.  1/5 Extubated 1/8 Converted to NSR overnight 1/9 Back to A.fib around 0200 1/11 R introducer out.  Unable to thread right subclavian catheter 1/12 Remains weak still pressor dependent 1/13 Repeat BC sent. Still on pressors, Zosyn changed to meropenem.  Chest abdomen and pelvis CT obtained : Moderate hemorrhagic pericardial effusion, 5.3 cm hematoma right thrombus. Arterial gas bilateral patchy opacities mild dependent posterior right upper lobe atelectasis was unremarkable layering gallstones; neg for tamponade on repeat Echo 1/14 New right IJ HD catheter placed. Echo obtained to better evaluate her EF is 35 to 40%.  The LV demonstrates regional wall motion abnormalities there is asymmetric left ventricular hypertrophy of the inferior  lateral segment RV systolic function moderate regular reduced left atrial wall dilated no mention of clot.  1/15 Still on fairly high-dose norepinephrine 1/16 Cont CRRT + low dose NE  1/17 RHC confirms low output state, restarted on milrinone 1/20 CRRT stopped 1/21: D/c milrinone yesterday, ongoing increase NE requirements during nights. Started droxidopa in addition to midodrine to aid off NE.  1/22: Levo up to 18 overnight (max so far), cvp 9, restarting crrt today. Received dose methylene blue, Cortisol 21 1/23: Levo down to 9, CVP 8 1/24: Tunneled HD and tunneled dual lumen LIJ CVC placed  1/27: 1U PRBC given for Hgb 7.2 and hypotension. Off levo.  1/29: CRRT stopped. Cortrak clogged, removed.  Interim History / Subjective:  No significant events overnight Appears down/more withdrawn this morning, tearful Frustrated with LOS and awaiting return of kidney function Working very hard on increasing her PO intake, ate breakfast this morning Spiritual Care consult placed for chaplain  Objective:  Blood pressure (!) 108/59, pulse 70, temperature 97.7 F (36.5 C), temperature source Oral, resp. rate (!) 40, height 5\' 3"  (1.6 m), weight 74 kg, SpO2 97%. CVP:  [2 mmHg-53 mmHg] 9 mmHg      Intake/Output Summary (Last 24 hours) at 12/30/2023 0731 Last data filed at 12/30/2023 0600 Gross per 24 hour  Intake 703.14 ml  Output 124 ml  Net 579.14 ml   Filed Weights   12/28/23 0432 12/29/23 0500 12/30/23 0500  Weight: 74.4 kg 74.4 kg 74 kg   Physical Examination: General: Acutely ill-appearing older woman in NAD. Appears down, somewhat withdrawn. HEENT: [place]/AT, anicteric sclera, PERRL, moist mucous membranes. Neuro: Awake, oriented x 4. Responds to verbal stimuli. Following  commands consistently. Moves all 4 extremities spontaneously. Generalized weakness. Chest: Tunneled double-lumen catheter to L chest. Tunneled dialysis catheter to RIJ. CV: RRR, +III/VI systolic murmur. PULM: Breathing  mildly tachypneic and unlabored on 3LNC. Lung fields diminished at bilateral bases. GI: Soft, nontender, nondistended. Normoactive bowel sounds. Extremities: Bilateral symmetric 1-2+ LE edema noted. Skin: Warm/dry, no rashes.  Resolved Hospital Problem List:   Postoperative vent management Postop tamponade resolved after relook and washout on day 1  Assessment & Plan:   Severe mitral regurg s/p prior mitral clip, now s/p MVR Moderate tricuspid regurgitation s/p TV repair Persistent cardioplegia postoperatively- confirmed on RHC pHTN AoC HFrEF NICM PAF, flutter  S/p MVR and tricuspid ring 12/30 w/ Weldner. Back to OR for tamponade. Decompensated requiring intubation for FVO, renal failure, pHTN. 1/17 RHC with low filling pressures and started on milrinone, subsequently off. S/p methylene blue 1/22. - Management per AHF, appreciate assistance - Continue amio - Heparin gtt with warfarin bridge (1/15), INR 1.7 today 1/30 - SBP goal > 85 - Continue midodrine TID, droxidopa off - Remains off of IV vasopressors - Trend CVP, Co-ox discontinued  Acute renal failure on CKD III CRRT started 1/14, stopped 1/20, restarted 1/22, stopped 1/29. - Nephro following, appreciate assistance - Off of CRRT since 1/29, tentative plan for iHD 1/31 - Trend BMP - Replete electrolytes as indicated - Monitor I&Os - Avoid nephrotoxic agents as able - Ensure adequate renal perfusion  R/o Sepsis,  unclear source Shock resolved Pan scan 1/13 with patchy lower lobe opacities, no AP findings.  No definite source. Lines were replaced 1/24.  Off of meropenem since 1/25. BC and UC neg 1/13. - SBP goal > 85 - Remains off of vasopressors, on midodrine - Trend WBC, fever curve - F/u Cx data - Monitor off of antibiotics, continued slow clinical improvement  Acute on chronic anemia Thrombocytopenia, potentially due to antibiotics, doubt sepsis, improving - Trend H&H, Plt - Monitor for signs of active  bleeding - Transfuse for Hgb < 7.0 or hemodynamically significant bleeding - Last transfusion 1/27 (1U PRBCs)  Hyperglycemia/ prediabetes A1c 6 11/25/2023. - SSI - CBGs ACHS - Goal CBG 140-180  Goiter Off of methimazole, TSH WNL. - Outpatient Endocrinology follow up  Situational depression Sleep disturbance - Continue Zoloft - Seroquel at bedtime, melatonin, trazodone PRN - Mirtazapine as above - Spiritual care consult placed  Deep tissue injury on left buttocks approximately 2 cm in diameter purple discoloration - WOC consulted - High protein diet to promote wound healing  Protein calorie malnutrition, severe Early satiety - Appreciate RD assistance - Cortrak clogged 1/29, removed - Calorie count in place, attempting without Cortrak; will consider replacement 1/31 if no improvement in PO in take - Mirtazapine for appetite stimulation - Continue Reglan for early satiety  Physical deconditioning  - PT/OT, encourage OOB now that patient is off of CRRT  Best Practice (right click and "Reselect all SmartList Selections" daily)   Diet/type: Encourage PO, supplement TF DVT prophylaxis systemic heparin/ warfarin Pressure ulcer(s): L buttocks  GI prophylaxis: N/A Lines: HD cath 1/24, tunneled CVC 1/24 Foley:  N/A Code Status:  full code Last date of multidisciplinary goals of care discussion [per primary]  Critical care time:   The patient is critically ill with multiple organ system failure and requires high complexity decision making for assessment and support, frequent evaluation and titration of therapies, advanced monitoring, review of radiographic studies and interpretation of complex data.   Critical Care Time devoted to patient  care services, exclusive of separately billable procedures, described in this note is 36 minutes.  Tim Lair, PA-C [facility] 12/30/23 7:31 AM  Please see [email or web address] for pager details.  From 7A-7P if no  response, please call [phone] After hours, please call ELink [phone]

## 2023-12-30 NOTE — Progress Notes (Signed)
Physical Therapy Treatment Patient Details Name: Vanessa [name] MRN: [id number] DOB: [birth year] Today's Date: 12/30/2023   History of Present Illness 73 yo F admitted 12/30 with DOE, increasing MR and TR. LCH without CAD. 12/30 MVR and tricuspid valve repair. Intubated 1/2-1/5. 1/2 CRRT started/stopped on 1/12. Pt HD line was removed on 1/13 and CRRT was re-started on 1/14-1/20, CRRT restarted on 1/22 and stopped 1/29.  PMhx: HTN, NICM, HFrEF (EF40-45%), Pulmonary HTN, CKD and  severe MR s/p prior mitral clip x 2 in 2020.    PT Comments  Pt now off CRRT and was able to amb into the hallway this date with EVA walker, 61' x2 with seated rest break. Pt with improving spirits now that she is starting to see progress. Acute PT to cont to follow.    If plan is discharge home, recommend the following: Assistance with cooking/housework;Help with stairs or ramp for entrance   Can travel by private vehicle        Equipment Recommendations  Rolling walker (2 wheels);BSC/3in1    Recommendations for Other Services Rehab consult     Precautions / Restrictions Precautions Precautions: Sternal;Fall;Other (comment) Precaution Booklet Issued: No Precaution Comments: Sternal precautions reviewed verbally with pt demonstrating good carryover of training from prior sessions this day (1/29) Restrictions Weight Bearing Restrictions Per Provider Order: No (Sternal Precautions) RUE Weight Bearing Per Provider Order: Non weight bearing LUE Weight Bearing Per Provider Order: Non weight bearing Other Position/Activity Restrictions: sternal precautions     Mobility  Bed Mobility Overal bed mobility: Needs Assistance Bed Mobility: Supine to Sit     Supine to sit: HOB elevated, Mod assist     General bed mobility comments: pt able move LEs to EOB, due to inability to push/pull with UEs pt continue to require modA for trunk elevation and to scoot to EOB    Transfers Overall transfer level: Needs  assistance Equipment used: None Transfers: Bed to chair/wheelchair/BSC Sit to Stand: Mod assist, +2 physical assistance, +2 safety/equipment (adhering to sternal precautions)           General transfer comment: pt with fear of falling and resistant to leaning forward despite max encouragement and plus 2 help to power up, modAx2 to power up    Ambulation/Gait Ambulation/Gait assistance: Min assist, +2 physical assistance, +2 safety/equipment Gait Distance (Feet): 50 Feet (x2) Assistive device: Fara Boros Gait Pattern/deviations: Trunk flexed, Step-to pattern, Decreased step length - right, Decreased step length - left, Decreased stride length Gait velocity: decreased Gait velocity interpretation: <1.31 ft/sec, indicative of household ambulator   General Gait Details: pt no longer on CRRT and was able to ambulate 50'x2 with eva walker, minA to control eva walker   Stairs             Wheelchair Mobility     Tilt Bed    Modified Rankin (Stroke Patients Only)       Balance Overall balance assessment: Needs assistance Sitting-balance support: Single extremity supported, No upper extremity supported, Feet supported Sitting balance-Leahy Scale: Good Sitting balance - Comments: close Supervision for safety with pt sitting EOB but no LOB noted this session   Standing balance support: Single extremity supported, Bilateral upper extremity supported, Reliant on assistive device for balance Standing balance-Leahy Scale: Poor Standing balance comment: reliant on external support                            Cognition Arousal: Alert Behavior During  Therapy: WFL for tasks assessed/performed, Flat affect Overall Cognitive Status: Within Functional Limits for tasks assessed                                 General Comments: pt initially in depressed spirits however responded well to humor of PT and rehab tech. Pt frustrated with people telling her to eat  but she can't eat too much because it makes her full and uncomfortable, once patient "vented" pt gave great effort and surpassed her goals regarding ambulation        Exercises      General Comments General comments (skin integrity, edema, etc.): vss      Pertinent Vitals/Pain Pain Assessment Pain Assessment: No/denies pain    Home Living                          Prior Function            PT Goals (current goals can now be found in the care plan section) Acute Rehab PT Goals PT Goal Formulation: With patient Time For Goal Achievement: 01/03/24 Potential to Achieve Goals: Good Progress towards PT goals: Progressing toward goals    Frequency    Min 1X/week      PT Plan      Co-evaluation              AM-PAC PT "6 Clicks" Mobility   Outcome Measure  Help needed turning from your back to your side while in a flat bed without using bedrails?: A Lot Help needed moving from lying on your back to sitting on the side of a flat bed without using bedrails?: A Lot Help needed moving to and from a bed to a chair (including a wheelchair)?: A Lot Help needed standing up from a chair using your arms (e.g., wheelchair or bedside chair)?: A Lot Help needed to walk in hospital room?: A Lot Help needed climbing 3-5 steps with a railing? : Total 6 Click Score: 11    End of Session Equipment Utilized During Treatment: Oxygen Activity Tolerance: Patient limited by fatigue Patient left: with call bell/phone within reach;with nursing/sitter in room;in chair Nurse Communication: Mobility status PT Visit Diagnosis: Other abnormalities of gait and mobility (R26.89);Muscle weakness (generalized) (M62.81);Difficulty in walking, not elsewhere classified (R26.2)     Time: 4098-1191 PT Time Calculation (min) (ACUTE ONLY): 31 min  Charges:    $Gait Training: 8-22 mins $Therapeutic Activity: 8-22 mins PT General Charges $$ ACUTE PT VISIT: 1 Visit                      Lewis Shock, PT, DPT Acute Rehabilitation Services Secure chat preferred Office #: [phone]    Iona Hansen 12/30/2023, 3:42 PM

## 2023-12-30 NOTE — Progress Notes (Signed)
PHARMACY - ANTICOAGULATION CONSULT NOTE  Pharmacy Consult for IV heparin + warfarin Indication: atrial fibrillation  No Known Allergies  Patient Measurements: Height: 5\' 3"  (160 cm) Weight: 74 kg (163 lb 2.3 oz) IBW/kg (Calculated) : 52.4 Heparin Dosing Weight: ~ 70 kg  Vital Signs: Temp: 98.2 F (36.8 C) (01/30 0759) Temp Source: Oral (01/30 0759) BP: 108/59 (01/30 0600) Pulse Rate: 70 (01/30 0600)  Labs: Recent Labs    12/28/23 0427 12/28/23 1551 12/29/23 0414 12/29/23 1703 12/30/23 0419  HGB 8.5*  --  8.5*  --  8.0*  HCT 27.9*  --  27.8*  --  26.6*  PLT 144*  --  152  --  157  LABPROT 16.5*  --  18.0*  --  19.7*  INR 1.3*  --  1.5*  --  1.7*  HEPARINUNFRC 0.74* 0.54 0.61  --  0.52  CREATININE 1.04* 0.98 0.97 1.37* 2.11*    Estimated Creatinine Clearance: 23.2 mL/min (A) (by C-G formula based on SCr of 2.11 mg/dL (H)).  Warfarin doses received (INR): 1/24 - 2.5 mg, 1.2 1/25 - 2.5 mg, 1.1 1/26 - 5 mg, 1.2 1/27 - 5 mg, 1.3   Assessment: 73 yo female s/p scheduled bioprosthetic mitral valve replacement with tissue valve 12/30 now with afib, pharmacy asked to dose anticoagulation with IV heparin. Spontaneously converted 1/7; flipped back to AF overnight 1/9 0300. Baseline INR 1.1, slow uptrend to 1.3 s/p 6 days warfarin therapy, total dose 20 mg, then held 1/21-24 for tunneled access plans. Patient now s/p [facility] and tunneled central line placement 1/24 AM on CRRT, long-term dialysis plan pending.  CBC remains low-stable, off norepinephrine since 1/26, remains on midodrine. Resumed anticoagulation per VIR after tunneled cath placement.  Heparin level is therapeutic at 0.52, on 1000 units/hr. Hgb 8, plt 157. INR is subtherapeutic at 1.7 but slowly trending up. Minimal oral intake - received 3 ensure yesterday, cortrak out remains now. No s/sx of bleeding or infusion issues.   Goal of Therapy:  Heparin level 0.3 - 0.7 units INR goal 2 - 3 (bMVR) Monitor platelets by  anticoagulation protocol: Yes   Plan:  Continue IV heparin at 1000 units/hr Continue warfarin 6 mg x1 Daily heparin level, CBC and INR.  Thank you for allowing pharmacy to participate in this patient's care,  Sherron Monday, PharmD, BCCCP Clinical Pharmacist  Phone: [phone] 12/30/2023 8:15 AM  Please check AMION for all [facility] phone numbers After 10:00 PM, call Main Pharmacy [phone]

## 2023-12-30 NOTE — Progress Notes (Signed)
Patient ID: Vanessa [name], female   DOB: 10/28/1951, 73 y.o.   MRN: [id number]      Advanced Heart Failure Rounding Note  Cardiologist: Dr. Shirlee Latch  Chief Complaint: Vasoplegia   Subjective:   12/30 S/P MVR (tissue) and TV ring and same day return to OR for tamponade/bleeding. 1/2: Progressive volume overload with poor UOP, CVVH started but ended up re-intubated.   Extubated 12/05/23 1/12: CVVH stopped 1/13: iHD, HD line removed 1/14: CRRT Restarted.  1/17: RHC mean RA 2, PA 47/20, mean PCWP 5, CI 1.74, PVR 8, PAPi 9.  Milrinone 0.125 started. 1/20 CRRT stopped.  1/22 methylene blue for persistent vasoplegia  1/24 tunneled HD cath placed. CRRT restarted 1/29 CRRT stopped  PO intake not great yesterday. Ate more breakfast this am.   No dyspnea or orthopnea. + Fatigue. Depressed affect.   Remains anuric.  Objective:    Weight Range: 74 kg Body mass index is 28.9 kg/m.   Vital Signs:   Temp:  [97.7 F (36.5 C)-98.5 F (36.9 C)] 97.7 F (36.5 C) (01/29 1600) Pulse Rate:  [67-93] 70 (01/30 0600) Resp:  [15-74] 40 (01/30 0600) BP: (82-136)/(53-92) 108/59 (01/30 0600) SpO2:  [94 %-100 %] 97 % (01/30 0600) Weight:  [74 kg] 74 kg (01/30 0500) Last BM Date : 12/29/23  Weight change: Filed Weights   12/28/23 0432 12/29/23 0500 12/30/23 0500  Weight: 74.4 kg 74.4 kg 74 kg   Intake/Output:   Intake/Output Summary (Last 24 hours) at 12/30/2023 0721 Last data filed at 12/30/2023 0600 Gross per 24 hour  Intake 703.14 ml  Output 124 ml  Net 579.14 ml    CVP 11 Physical Exam   General:  Ill appearing elderly female HEENT: normal Neck: R internal jugular dressing. Tunneled dialysis catheter R internal jugular.  Cor: Regular rate & rhythm. No rubs, gallops, II/IV systolic murmur Lungs: diminished anteriorly Abdomen: soft, nontender, nondistended. Extremities: no cyanosis, clubbing, rash, edema Neuro: alert & orientedx3. Affect depressed    Telemetry   SR  60s-70s  Labs    CBC Recent Labs    12/29/23 0414 12/30/23 0419  WBC 7.5 8.0  HGB 8.5* 8.0*  HCT 27.8* 26.6*  MCV 103.0* 105.1*  PLT 152 157   Basic Metabolic Panel Recent Labs    40/98/11 0414 12/29/23 1703 12/30/23 0419  NA 135 135 134*  K 3.5 4.2 4.6  CL 99 98 99  CO2 25 26 27   GLUCOSE 104* 131* 101*  BUN 16 25* 33*  CREATININE 0.97 1.37* 2.11*  CALCIUM 8.0* 8.4* 8.5*  MG 2.6*  --  2.9*  PHOS 4.2 3.2 4.5   Liver Function Tests Recent Labs    12/29/23 1703 12/30/23 0419  ALBUMIN 2.5* 2.5*   No results for input(s): "LIPASE", "AMYLASE" in the last 72 hours. Cardiac Enzymes No results for input(s): "CKTOTAL", "CKMB", "CKMBINDEX", "TROPONINI" in the last 72 hours.  BNP: BNP (last 3 results) Recent Labs    05/26/23 1610 08/26/23 1103 09/22/23 0905  BNP 778.0* 781.0* 926.0*   Other results:  Imaging   No results found.  Medications:    Scheduled Medications:  amiodarone  200 mg Oral BID   ascorbic acid  250 mg Oral BID   Chlorhexidine Gluconate Cloth  6 each Topical Daily   darbepoetin (ARANESP) injection - NON-DIALYSIS  100 mcg Subcutaneous Q Tue-1800   feeding supplement  237 mL Oral TID BM   fiber supplement (BANATROL TF)  60 mL Per Tube BID  Gerhardt's butt cream   Topical BID   influenza vaccine adjuvanted  0.5 mL Intramuscular Tomorrow-1000   insulin aspart  0-24 Units Subcutaneous Q4H   metoCLOPramide (REGLAN) injection  5 mg Intravenous Q8H   midodrine  20 mg Oral Q8H   mirtazapine  7.5 mg Oral QHS   multivitamin  1 tablet Oral BID   QUEtiapine  25 mg Oral QHS   sertraline  25 mg Oral Daily   Warfarin - Pharmacist Dosing Inpatient   Does not apply q1600   zinc sulfate (50mg  elemental zinc)  220 mg Oral Daily    Infusions:   prismasol BGK 4/2.5 400 mL/hr at 12/29/23 0325    prismasol BGK 4/2.5 400 mL/hr at 12/29/23 0325   sodium chloride Stopped (12/26/23 1005)   anticoagulant sodium citrate     heparin 1,000 Units/hr (12/30/23  0600)   prismasol BGK 2/2.5 dialysis solution 1,500 mL/hr at 12/29/23 0901    PRN Medications: sodium chloride, alteplase, anticoagulant sodium citrate, artificial tears, bisacodyl **OR** bisacodyl, feeding supplement, heparin, heparin, HYDROmorphone (DILAUDID) injection, levalbuterol, lidocaine (PF), lidocaine-prilocaine, lip balm, melatonin, ondansetron (ZOFRAN) IV, mouth rinse, oxyCODONE, pentafluoroprop-tetrafluoroeth, [START ON 12/31/2023] pneumococcal 20-valent conjugate vaccine, sodium chloride, sodium chloride, traZODone  Patient Profile   Vanessa [name] is a 73 y.o. with history of nonischemic cardiomyopathy and severe MR s/p Mitral Clip x2 (2020), pHTN, and CKD IIIb.   Assessment/Plan   1. Post Cardiotomy Cardiogenic & septic shock: Bioprosthetic MVR and tricuspid ring 11/29/23 with Dr. Leafy Ro. Pre-op cath with no significant CAD.  Pre-op echo with EF 40-45%, severe MR. Went back to the OR with post-op tamponade for clean-out.  Echo 12/31 showed EF 25-30%, moderate RV dysfunction, s/p bioprosthetic mitral valve replacement with trivial MR and tricuspid valve repair with trivial TR. Worsened on 1/2 and re-intubated in setting of volume overload, oliguric renal failure &  pulmonary hypertension. Extubated on 12/04/22. 1/13 CT C/A/P  moderate hemorrhagic pericardial effusion and 5.3 cm hematoma right heart border.  Repeat echo no tamponade. Requiring CRRT for volume removal.  Failed previous attempts to wean from CRRT. - Post -op echo 12/14/23 EF 35-40% RV mod reduced. Valves stable - RHC 1/17 showed RA 3 PA 47/20 (30) PCWP 5 PAPi 9 CI 1.74 - c/w vasoplegia. Remains on midodrine 20 TID. Off droxidopa.BP better today off CRRT. - CVP up to 11 today. Discussed with Nephrology. Planning to see if she can tolerate HD tomorrow. If fails, may need to restart CRRT. - Continue compression hose  2. Mitral regurgitation: s/p Mitraclip x 2 1/20. Echo 9/24 showed MV s/p Mitraclip x 2 with mean gradient  6 mmHg and severe mitral regurgitation.  She was not thought to be a candidate for an additional Mitraclip due to lack of room between the two existing clips. S/p MV replacement and Tricuspid repair on 11/29/23 with Dr. Leafy Ro.  - Last echo stable valves.   3. Ventricular ectopy:  - Quiescent now  - Continue amiodarone 200 mg bid   4. Pulmonary hypertension: Pre-op RHC (11/24) with PVR 4.5, with moderate mixed pulmonary arterial and pulmonary venous hypertension likely due to mitral valve regurgitation. Now s/p MVR, PA pressure severely elevated post-op.  Suspect still mixed PAH/PVH with elevated PCWP.  NO was tried at 10 ppm but did not have much effect so now off.  Will hold off on pulmonary vasodilators; small clinical trials demonstrate no improvement and some markers of harm with PDE5i in PH in the setting of mitral valve disease.  RHC 1/17 with mild pulmonary arterial hypertension.   5. 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 TSH was normal in 8/24. Follows with Endocrinology  6. Acute renal failure:  1/14 CRRT restarted. CRRT stopped 1/20 and restarted 1/22. Remains anuric.  - Remains anuric. CVP 11. Has TDC. - Off CRRT. Planning HD tomorrow as above. - Nephrology following.    7. Blood loss anemia - worsening be renal failure - 1u RBCs01/27 for hgb 7.2, hgb down slightly 8.0 today - Continue to monitor  8. Complete heart block: She had post-op CHB. Resolved.   9. Hypoxic / Hypercapnic respiratory failure: Reintubated 01/02.  Extubated 01/05.  - Stable sats on 3L [place]  10. Paroxsymal Atrial fibrillation:  - In SR on po amio.   - On Heparin drip.  - Warfarin dosing discussed with PharmD. INR 1.7 today.  11. ID: Suspected septic shock component, no definite source. All lines changed out and started on linezolid/meropenem.  Afebrile. Leukocytosis resolved  - Now off abx  12. Thrombocytopenia: Suspect due to sepsis. Resolved.  13. Deconditioning - Continue  aggressive PT/OT. Needs to get out of bed today. - will likely need inpatient rehab at discharge  14. Protein-calorie malnutrition - Cor-trak clotted and was removed on 01/29 - Encouraged po intake. Able to eat some breakfast this am.   [facility], Akia Montalban N, PA-C 12/30/23  Advanced Heart Failure Team Pager [phone] (M-F; 7a - 5p)  Please contact [facility] for night-coverage after hours (4p -7a ) and weekends on [email or web address]

## 2023-12-30 NOTE — Progress Notes (Signed)
   12/30/23 1400  Spiritual Encounters  Type of Visit Initial  Care provided to: Patient  Referral source Patient request  Reason for visit Routine spiritual support  OnCall Visit No  Spiritual Framework  Presenting Themes Meaning/purpose/sources of inspiration;Values and beliefs;Significant life change;Coping tools;Community and relationships;Courage hope and growth;Impactful experiences and emotions  Community/Connection Faith community;Family  Patient Stress Factors Health changes;Loss of control  Family Stress Factors Health changes;Loss of control  Interventions  Spiritual Care Interventions Made Established relationship of care and support;Compassionate presence;Reflective listening;Normalization of emotions;Narrative/life review  Intervention Outcomes  Outcomes Connection to spiritual care;Awareness around self/spiritual resourses;Connection to values and goals of care;Awareness of health;Awareness of support;Reduced anxiety   Chaplain responded to the consult upon patient's request. Pt has been having hard time to been in hospital, getting two open heart surgeries and kidney problems. She has been struggling with eating enough food. She shared that she normally eats early in the morning for breakfast and have dinner around 7 pm. She mentioned that If she eats some of fruit that keeps her full for whole day. Now, she needs to eat more for her kidneys to function. This gives her difficult times. She has some support from her nephew and niece. They keep provide support and push for her to think positively and eat regularly. She has strong connection with her Renato Gails. She has strong faith to God. Chaplain reflectively listened, normalized emotions, provided spiritual support, offered prayer and brought a Bible.  If there is a need for more assistance, chaplain will be available.    M.Kubra Delano Metz Resident [phone]

## 2023-12-30 NOTE — Progress Notes (Signed)
Patient ID: Vanessa [name], female   DOB: 07/22/1951, 73 y.o.   MRN: [id number] S: No new complaints this am.  Ate a pancake and a sausage this morning O:BP 109/77   Pulse 77   Temp 98.2 F (36.8 C) (Oral)   Resp (!) 21   Ht 5\' 3"  (1.6 m)   Wt 74 kg   SpO2 97%   BMI 28.90 kg/m   Intake/Output Summary (Last 24 hours) at 12/30/2023 0824 Last data filed at 12/30/2023 0600 Gross per 24 hour  Intake 693.14 ml  Output 55 ml  Net 638.14 ml   Intake/Output: I/O last 3 completed shifts: In: 1433 [P.O.:624; I.V.:349.1; NG/GT:200; IV Piggyback:259.9] Out: 417 [Stool:40]  Intake/Output this shift:  No intake/output data recorded. Weight change: -0.4 kg Gen: NAD CVS: RRR Resp:CTA Abd: +BS, soft, NT/ND Ext: 1+ pitting edema  Recent Labs  Lab 12/27/23 0438 12/27/23 1528 12/28/23 0427 12/28/23 1551 12/29/23 0414 12/29/23 1703 12/30/23 0419  NA 133* 135 132* 134* 135 135 134*  K 4.6 5.0 4.2 3.8 3.5 4.2 4.6  CL 100 102 99 100 99 98 99  CO2 26 25 26 26 25 26 27   GLUCOSE 132* 120* 127* 99 104* 131* 101*  BUN 16 18 20 18 16  25* 33*  CREATININE 1.03* 1.02* 1.04* 0.98 0.97 1.37* 2.11*  ALBUMIN 2.2* 2.4* 2.3* 2.5* 2.3* 2.5* 2.5*  CALCIUM 8.1* 8.1* 7.9* 8.2* 8.0* 8.4* 8.5*  PHOS 2.2* 2.3* 3.1 1.7* 4.2 3.2 4.5   Liver Function Tests: Recent Labs  Lab 12/29/23 0414 12/29/23 1703 12/30/23 0419  ALBUMIN 2.3* 2.5* 2.5*   No results for input(s): "LIPASE", "AMYLASE" in the last 168 hours. No results for input(s): "AMMONIA" in the last 168 hours. CBC: Recent Labs  Lab 12/27/23 0438 12/27/23 2254 12/28/23 0427 12/29/23 0414 12/30/23 0419  WBC 6.3 8.2 8.0 7.5 8.0  HGB 7.2* 8.4* 8.5* 8.5* 8.0*  HCT 24.5* 27.7* 27.9* 27.8* 26.6*  MCV 109.9* 105.3* 104.1* 103.0* 105.1*  PLT 144* 147* 144* 152 157   Cardiac Enzymes: No results for input(s): "CKTOTAL", "CKMB", "CKMBINDEX", "TROPONINI" in the last 168 hours. CBG: Recent Labs  Lab 12/29/23 1558 12/29/23 2010 12/30/23 0046  12/30/23 0409 12/30/23 0756  GLUCAP 140* 85 109* 95 96    Iron Studies: No results for input(s): "IRON", "TIBC", "TRANSFERRIN", "FERRITIN" in the last 72 hours. Studies/Results: No results found.  amiodarone  200 mg Oral BID   ascorbic acid  250 mg Oral BID   Chlorhexidine Gluconate Cloth  6 each Topical Daily   darbepoetin (ARANESP) injection - NON-DIALYSIS  100 mcg Subcutaneous Q Tue-1800   feeding supplement  237 mL Oral TID BM   fiber supplement (BANATROL TF)  60 mL Per Tube BID   Gerhardt's butt cream   Topical BID   influenza vaccine adjuvanted  0.5 mL Intramuscular Tomorrow-1000   insulin aspart  0-24 Units Subcutaneous Q4H   metoCLOPramide (REGLAN) injection  5 mg Intravenous Q8H   midodrine  20 mg Oral Q8H   mirtazapine  7.5 mg Oral QHS   multivitamin  1 tablet Oral BID   QUEtiapine  25 mg Oral QHS   sertraline  25 mg Oral Daily   warfarin  6 mg Oral ONCE-1600   Warfarin - Pharmacist Dosing Inpatient   Does not apply q1600   zinc sulfate (50mg  elemental zinc)  220 mg Oral Daily    BMET    Component Value Date/Time   NA 134 (L) 12/30/2023 2952  K 4.6 12/30/2023 0419   CL 99 12/30/2023 0419   CO2 27 12/30/2023 0419   GLUCOSE 101 (H) 12/30/2023 0419   BUN 33 (H) 12/30/2023 0419   CREATININE 2.11 (H) 12/30/2023 0419   CREATININE 1.30 (H) 04/29/2016 1144   CALCIUM 8.5 (L) 12/30/2023 0419   GFRNONAA 24 (L) 12/30/2023 0419   GFRAA 42 (L) 08/19/2020 1304   CBC    Component Value Date/Time   WBC 8.0 12/30/2023 0419   RBC 2.53 (L) 12/30/2023 0419   HGB 8.0 (L) 12/30/2023 0419   HCT 26.6 (L) 12/30/2023 0419   PLT 157 12/30/2023 0419   MCV 105.1 (H) 12/30/2023 0419   MCH 31.6 12/30/2023 0419   MCHC 30.1 12/30/2023 0419   RDW 22.8 (H) 12/30/2023 0419   LYMPHSABS 2.2 12/09/2023 1001   MONOABS 0.6 12/09/2023 1001   EOSABS 0.0 12/09/2023 1001   BASOSABS 0.2 (H) 12/09/2023 1001    Assessment/ Plan: Pt is a 73 y.o. yo female  with a past medical history HTN,  NICM, CHF, pulmonary hypertension, mitral and tricuspid valve disease who present w/ surgery for valvular disease c/b shock, AHRF, and AKI    1)  Anuric AKI on CKD 3A: Multifactorial etiology including cardiorenal syndrome and ischemic ATN due to shock.  Started CRRT on 1/2 for fluid overload.  The CRRT was held on 1/12 because of worsening leukocytosis and poor access with plan to have line holiday. IHD on 1/13 with intra-dialytic hypotension and no UF.  Resumed CRRT on 1/14-1/20 (new cath on 1/14) to manage volume. CRRT 1/22- 12/29/23.  No UOP -> BladderScan on 1/22 0mL.  TDC placed 12/24/23 by Dr. Juel Burrow.  CRRT stopped yesterday morning.  Bp remains soft and will attempt IHD tomorrow with po midodrine.  If she fails again, will likely need to resume CRRT.  2) #Multifactorial shock: Vasoplegic and cardioplegia catheter surgery.   - midodrine incr to 20mg  TID, milrinone off on 1/20, off droxidopa and norepi currently at 3. Cont mgmt per primary team   3) Acute exacerbation of heart failure with reduced ejection fraction: Volume overload. Optimized with CRRT.  Will try UF as tolerated with IHD tomorrow.    4) Severe MR and tricuspid valvular disease:  - Status post repair.  CT surgery managing   5) Postoperative cardiac tamponade:  - Continue monitoring per cardiology   6)  Hyponatremia: improved with CRRT    7) Anemia: Related to blood loss and compounded by AKI and critical illness.    - Transfusions per primary team.   - aranesp 40 mcg weekly on Tuesdays -> incr to 100   8)  Thrombocytopenia: switched from heparin to coumadin.   Irena Cords, MD [employer] [employer] [phone]

## 2023-12-31 DIAGNOSIS — I4891 Unspecified atrial fibrillation: Secondary | ICD-10-CM | POA: Diagnosis not present

## 2023-12-31 DIAGNOSIS — N179 Acute kidney failure, unspecified: Secondary | ICD-10-CM | POA: Diagnosis not present

## 2023-12-31 DIAGNOSIS — Z952 Presence of prosthetic heart valve: Secondary | ICD-10-CM | POA: Diagnosis not present

## 2023-12-31 DIAGNOSIS — R570 Cardiogenic shock: Secondary | ICD-10-CM | POA: Diagnosis not present

## 2023-12-31 DIAGNOSIS — R7303 Prediabetes: Secondary | ICD-10-CM

## 2023-12-31 DIAGNOSIS — N183 Chronic kidney disease, stage 3 unspecified: Secondary | ICD-10-CM

## 2023-12-31 DIAGNOSIS — I5023 Acute on chronic systolic (congestive) heart failure: Secondary | ICD-10-CM | POA: Diagnosis not present

## 2023-12-31 LAB — GLUCOSE, CAPILLARY
Glucose-Capillary: 99 mg/dL (ref 70–99)
Glucose-Capillary: 104 mg/dL — ABNORMAL HIGH (ref 70–99)
Glucose-Capillary: 125 mg/dL — ABNORMAL HIGH (ref 70–99)
Glucose-Capillary: 79 mg/dL (ref 70–99)
Glucose-Capillary: 151 mg/dL — ABNORMAL HIGH (ref 70–99)
Glucose-Capillary: 107 mg/dL — ABNORMAL HIGH (ref 70–99)

## 2023-12-31 LAB — CBC
HCT: 27.3 % — ABNORMAL LOW (ref 36.0–46.0)
Hemoglobin: 8.5 g/dL — ABNORMAL LOW (ref 12.0–15.0)
MCH: 32.3 pg (ref 26.0–34.0)
MCHC: 31.1 g/dL (ref 30.0–36.0)
MCV: 103.8 fL — ABNORMAL HIGH (ref 80.0–100.0)
Platelets: 174 10*3/uL (ref 150–400)
RBC: 2.63 MIL/uL — ABNORMAL LOW (ref 3.87–5.11)
RDW: 22.1 % — ABNORMAL HIGH (ref 11.5–15.5)
WBC: 7.6 10*3/uL (ref 4.0–10.5)
nRBC: 0.3 % — ABNORMAL HIGH (ref 0.0–0.2)

## 2023-12-31 LAB — RENAL FUNCTION PANEL
Albumin: 2.5 g/dL — ABNORMAL LOW (ref 3.5–5.0)
Anion gap: 13 (ref 5–15)
BUN: 51 mg/dL — ABNORMAL HIGH (ref 8–23)
CO2: 24 mmol/L (ref 22–32)
Calcium: 8.8 mg/dL — ABNORMAL LOW (ref 8.9–10.3)
Chloride: 98 mmol/L (ref 98–111)
Creatinine, Ser: 3.67 mg/dL — ABNORMAL HIGH (ref 0.44–1.00)
GFR, Estimated: 13 mL/min — ABNORMAL LOW (ref >60)
Glucose, Bld: 100 mg/dL — ABNORMAL HIGH (ref 70–99)
Phosphorus: 6.8 mg/dL — ABNORMAL HIGH (ref 2.5–4.6)
Potassium: 5 mmol/L (ref 3.5–5.1)
Sodium: 135 mmol/L (ref 135–145)

## 2023-12-31 LAB — PROTIME-INR
INR: 2.1 — ABNORMAL HIGH (ref 0.8–1.2)
Prothrombin Time: 23.9 s — ABNORMAL HIGH (ref 11.4–15.2)

## 2023-12-31 LAB — MAGNESIUM: Magnesium: 3 mg/dL — ABNORMAL HIGH (ref 1.7–2.4)

## 2023-12-31 LAB — HEPARIN LEVEL (UNFRACTIONATED): Heparin Unfractionated: 0.56 [IU]/mL (ref 0.30–0.70)

## 2023-12-31 MED ORDER — AMIODARONE HCL 200 MG PO TABS
200.0000 mg | ORAL_TABLET | Freq: Every day | ORAL | Status: DC
  Administered 2023-12-31 – 2024-01-12 (×13): 200 mg via ORAL
  Filled 2023-12-31 (×13): qty 1

## 2023-12-31 MED ORDER — WARFARIN SODIUM 5 MG PO TABS: 5.0000 mg | ORAL_TABLET | Freq: Once | ORAL | Status: DC

## 2023-12-31 MED ORDER — WARFARIN SODIUM 2 MG PO TABS
4.0000 mg | ORAL_TABLET | Freq: Once | ORAL | Status: AC
  Administered 2023-12-31: 4 mg via ORAL
  Filled 2023-12-31: qty 2

## 2023-12-31 NOTE — Progress Notes (Signed)
Patient ID: Vanessa [name], female   DOB: 10-Mar-1951, 73 y.o.   MRN: [id number]   S: No new complaints this am.  No UOP- crt rising  O:BP 120/64   Pulse 68   Temp (!) 96.9 F (36.1 C) (Oral)   Resp 20   Ht 5\' 3"  (1.6 m)   Wt 75.2 kg   SpO2 97%   BMI 29.37 kg/m   Intake/Output Summary (Last 24 hours) at 12/31/2023 0709 Last data filed at 12/31/2023 0531 Gross per 24 hour  Intake 298.12 ml  Output --  Net 298.12 ml   Intake/Output: I/O last 3 completed shifts: In: 417.3 [P.O.:79; I.V.:338.3] Out: -   Intake/Output this shift:  No intake/output data recorded. Weight change: 1.2 kg Gen: NAD CVS: RRR Resp:CTA Abd: +BS, soft, NT/ND Ext:  minimal edema when examined in the bed   Recent Labs  Lab 12/27/23 1528 12/28/23 0427 12/28/23 1551 12/29/23 0414 12/29/23 1703 12/30/23 0419 12/31/23 0519  NA 135 132* 134* 135 135 134* 135  K 5.0 4.2 3.8 3.5 4.2 4.6 5.0  CL 102 99 100 99 98 99 98  CO2 25 26 26 25 26 27 24   GLUCOSE 120* 127* 99 104* 131* 101* 100*  BUN 18 20 18 16  25* 33* 51*  CREATININE 1.02* 1.04* 0.98 0.97 1.37* 2.11* 3.67*  ALBUMIN 2.4* 2.3* 2.5* 2.3* 2.5* 2.5* 2.5*  CALCIUM 8.1* 7.9* 8.2* 8.0* 8.4* 8.5* 8.8*  PHOS 2.3* 3.1 1.7* 4.2 3.2 4.5 6.8*   Liver Function Tests: Recent Labs  Lab 12/29/23 1703 12/30/23 0419 12/31/23 0519  ALBUMIN 2.5* 2.5* 2.5*   No results for input(s): "LIPASE", "AMYLASE" in the last 168 hours. No results for input(s): "AMMONIA" in the last 168 hours. CBC: Recent Labs  Lab 12/27/23 2254 12/28/23 0427 12/29/23 0414 12/30/23 0419 12/31/23 0519  WBC 8.2 8.0 7.5 8.0 7.6  HGB 8.4* 8.5* 8.5* 8.0* 8.5*  HCT 27.7* 27.9* 27.8* 26.6* 27.3*  MCV 105.3* 104.1* 103.0* 105.1* 103.8*  PLT 147* 144* 152 157 174   Cardiac Enzymes: No results for input(s): "CKTOTAL", "CKMB", "CKMBINDEX", "TROPONINI" in the last 168 hours. CBG: Recent Labs  Lab 12/30/23 1115 12/30/23 1546 12/30/23 1940 12/30/23 2344 12/31/23 0423  GLUCAP 127*  87 110* 106* 99    Iron Studies: No results for input(s): "IRON", "TIBC", "TRANSFERRIN", "FERRITIN" in the last 72 hours. Studies/Results: No results found.  amiodarone  200 mg Oral BID   ascorbic acid  250 mg Oral BID   Chlorhexidine Gluconate Cloth  6 each Topical Daily   darbepoetin (ARANESP) injection - NON-DIALYSIS  100 mcg Subcutaneous Q Tue-1800   feeding supplement  237 mL Oral TID BM   fiber supplement (BANATROL TF)  60 mL Per Tube BID   Gerhardt's butt cream   Topical BID   influenza vaccine adjuvanted  0.5 mL Intramuscular Tomorrow-1000   insulin aspart  0-24 Units Subcutaneous Q4H   metoCLOPramide (REGLAN) injection  5 mg Intravenous Q8H   midodrine  20 mg Oral Q8H   mirtazapine  7.5 mg Oral QHS   multivitamin  1 tablet Oral BID   QUEtiapine  25 mg Oral QHS   sertraline  25 mg Oral Daily   Warfarin - Pharmacist Dosing Inpatient   Does not apply q1600    BMET    Component Value Date/Time   NA 135 12/31/2023 0519   K 5.0 12/31/2023 0519   CL 98 12/31/2023 0519   CO2 24 12/31/2023 0519  GLUCOSE 100 (H) 12/31/2023 0519   BUN 51 (H) 12/31/2023 0519   CREATININE 3.67 (H) 12/31/2023 0519   CREATININE 1.30 (H) 04/29/2016 1144   CALCIUM 8.8 (L) 12/31/2023 0519   GFRNONAA 13 (L) 12/31/2023 0519   GFRAA 42 (L) 08/19/2020 1304   CBC    Component Value Date/Time   WBC 7.6 12/31/2023 0519   RBC 2.63 (L) 12/31/2023 0519   HGB 8.5 (L) 12/31/2023 0519   HCT 27.3 (L) 12/31/2023 0519   PLT 174 12/31/2023 0519   MCV 103.8 (H) 12/31/2023 0519   MCH 32.3 12/31/2023 0519   MCHC 31.1 12/31/2023 0519   RDW 22.1 (H) 12/31/2023 0519   LYMPHSABS 2.2 12/09/2023 1001   MONOABS 0.6 12/09/2023 1001   EOSABS 0.0 12/09/2023 1001   BASOSABS 0.2 (H) 12/09/2023 1001    Assessment/ Plan: Pt is a 73 y.o. yo female  with HTN, NICM, pulmonary hypertension, mitral and tricuspid valve disease who present w/ surgery for valvular disease c/b shock, AHRF, and AKI    1)  Anuric AKI on CKD  3A: Multifactorial etiology including cardiorenal syndrome and ischemic ATN due to shock.  Started CRRT on 1/2 for fluid overload.  The CRRT was held on 1/12 because of worsening leukocytosis and poor access with plan to have line holiday. IHD on 1/13 with intra-dialytic hypotension and no UF.  Resumed CRRT on 1/14-1/20 (new cath on 1/14) to manage volume. CRRT 1/22- 12/29/23.  No UOP -> BladderScan on 1/22 0mL.  TDC placed 12/24/23 by Dr. Juel Burrow.  CRRT stopped 1/29 morning.  Bp remains soft and will attempt IHD today with po midodrine 20 TID.  If she fails again, will likely need to resume CRRT.  2) #Multifactorial shock: Vasoplegic and cardioplegia catheter surgery.   - midodrine incr to 20mg  TID, milrinone off on 1/20, off droxidopa and norepi currently at 3. Cont mgmt per primary team   3) Acute exacerbation of heart failure with reduced ejection fraction: Volume overload. Optimized with CRRT.  Will try UF as tolerated with IHD tomorrow.    4) Severe MR and tricuspid valvular disease:  - Status post repair.  CT surgery managing   5) Postoperative cardiac tamponade:  - Continue monitoring per cardiology   6)  Hyponatremia: improved with CRRT    7) Anemia: Related to blood loss and compounded by AKI and critical illness.    - Transfusions per primary team.   - aranesp 40 mcg weekly on Tuesdays -> incr to 100   8)  Thrombocytopenia: switched from heparin to coumadin. Resolved   Cecille Aver  [facility] [phone]

## 2023-12-31 NOTE — Progress Notes (Signed)
Brief Nutrition Follow-up:  Noted plan for iHD today. Pt off CRRT since 1/29. Remains anuric. Noted may need to resume CRRT if does not tolerate iHD  Pt complains of early satiety, feeling full. Pt TF have been off >48 hours. Pt only ate a few bites of potatoes at breakfast, did not want Ensure this AM. RN reports pate drank 25% Ensure last  night. Minimal po intake yesterday  As noted by other providers, pt appears flat, discouraged, depressed.   Discussed plan with PCCM and plan to continue to hold on Cortrak placement  Pt has been on reglan, +BMs  Interventions:  1) Continue Liberalized diet-Regular; educated and encouraged pt on small, frequent meals for now, sips/bites throughout the day. OK for family/friends to bring in outside food for pt if she prefers  2) Continue Ensure supplements 3) continue Renal MVI  If no improvement of po intake by Monday 2/03, recommend considering Cortrak re-insertion with re-initiation of TF. However, may be at the point that PEG placement may need to become part of GOC discussion given LOS 32 days and appetite has been poor with minimal intake since diet advanced without improvement despite max interventions. This looks to be a chronic issue at this point that may improve over time but will likely take at least weeks to months. Of note, PEG can be removed at anytime post placement (once appropriate time has passed for site to heal) once oral intake is adequate  Romelle Starcher MS, RDN, LDN, CNSC Registered Dietitian 3 Clinical Nutrition RD Inpatient Contact Info in [place] '

## 2023-12-31 NOTE — Progress Notes (Signed)
NAME:  Vanessa [name], MRN:  [id number], DOB:  12-Feb-1951, LOS: 32 ADMISSION DATE:  11/29/2023, CONSULTATION DATE:  11/29/23 REFERRING MD: Leafy Ro - TCTS, CHIEF COMPLAINT: Postoperative management  History of Present Illness:  72yoF with PMH significant for HTN, NICM, HFrEF (EF40-45%), PHTN, CKD and hx of prior severe MR s/p prior mitral clip x 2 in 2020.  Pt with progressive DOE found to have increasing MR and moderate TR despite adjustments in GDMT.  Not felt to be candidate for additional mitral clip.  Repeat LHC without CAD but showed moderate PHTN.  Underwent mitral valve replacement with tissue valve and tricuspid valve repair by Dr. Leafy Ro on 12/30.  PCCM consulted to assist with vent and medical management post-operatively in ICU.   Pertinent Medical History:  Never smoker, MR w/ previous Mitral clip x 2 (2020), NICM, HFrEF, PHTN, HTN, CKD3b  Significant Hospital Events: Including procedures, antibiotic start and stop dates in addition to other pertinent events   12/30 MVR by Dr. Leafy Ro; relook for tamponade/washout later in evening without obvious source 1/2 Intubated for respiratory decompensation, iNO started. Vanc, meropenem started. CRRT started.  1/5 Extubated 1/8 Converted to NSR overnight 1/9 Back to A.fib around 0200 1/11 R introducer out.  Unable to thread right subclavian catheter 1/12 Remains weak still pressor dependent 1/13 Repeat BC sent. Still on pressors, Zosyn changed to meropenem.  Chest abdomen and pelvis CT obtained : Moderate hemorrhagic pericardial effusion, 5.3 cm hematoma right thrombus. Arterial gas bilateral patchy opacities mild dependent posterior right upper lobe atelectasis was unremarkable layering gallstones; neg for tamponade on repeat Echo 1/14 New right IJ HD catheter placed. Echo obtained to better evaluate her EF is 35 to 40%.  The LV demonstrates regional wall motion abnormalities there is asymmetric left ventricular hypertrophy of the inferior  lateral segment RV systolic function moderate regular reduced left atrial wall dilated no mention of clot.  1/15 Still on fairly high-dose norepinephrine 1/16 Cont CRRT + low dose NE  1/17 RHC confirms low output state, restarted on milrinone 1/20 CRRT stopped 1/21: D/c milrinone yesterday, ongoing increase NE requirements during nights. Started droxidopa in addition to midodrine to aid off NE.  1/22: Levo up to 18 overnight (max so far), cvp 9, restarting crrt today. Received dose methylene blue, Cortisol 21 1/23: Levo down to 9, CVP 8 1/24: Tunneled HD and tunneled dual lumen LIJ CVC placed  1/27: 1U PRBC given for Hgb 7.2 and hypotension. Off levo.  1/29: CRRT stopped. Cortrak clogged, removed.  Interim History / Subjective:  No significant events overnight Appears down/withdrawn, I'm concerned she's getting depressed Trying to eat breakfast, but early satiety is significant today - has only had a few bites of potatoes Calorie count ongoing, may need Cortrak replacement Plan for iHD attempt today, will try first session in-unit as concern for soft BP If she tolerates this well can go to HD unit for change of scenery  Objective:  Blood pressure 120/64, pulse 68, temperature (!) 96.9 F (36.1 C), temperature source Oral, resp. rate 20, height 5\' 3"  (1.6 m), weight 75.2 kg, SpO2 97%. CVP:  [5 mmHg-14 mmHg] 8 mmHg      Intake/Output Summary (Last 24 hours) at 12/31/2023 0722 Last data filed at 12/31/2023 0531 Gross per 24 hour  Intake 298.12 ml  Output --  Net 298.12 ml   Filed Weights   12/29/23 0500 12/30/23 0500 12/31/23 0500  Weight: 74.4 kg 74 kg 75.2 kg   Physical Examination: General: Acute-on-chronically  ill-appearing older woman in NAD. Appears down, withdrawn. HEENT: [place]/AT, anicteric sclera, PERRL, moist mucous membranes. Neuro: Awake, oriented x 4. Responds to verbal stimuli. Following commands consistently. Moves all 4 extremities spontaneously. Generalized weakness,  strength 4/5 in all 4 extremities.  CV: RRR, +III/VI systolic murmur. PULM: Breathing even and unlabored on 2LNC. Lung fields diminished at bases. GI: Soft, nontender, nondistended. Normoactive bowel sounds. Extremities: Bilateral symmetric 1-2+ LE edema noted. Skin: Warm/dry, no rashes.  Resolved Hospital Problem List:   Postoperative vent management Postop tamponade resolved after relook and washout on day 1  Assessment & Plan:   Severe mitral regurg s/p prior mitral clip, now s/p MVR Moderate tricuspid regurgitation s/p TV repair Persistent cardioplegia postoperatively- confirmed on RHC pHTN AoC HFrEF NICM PAF, flutter  S/p MVR and tricuspid ring 12/30 w/ [place]ner. Back to OR for tamponade. Decompensated requiring intubation for FVO, renal failure, pHTN. 1/17 RHC with low filling pressures and started on milrinone, subsequently off. S/p methylene blue 1/22. - Management per AHF, appreciate assistance - Continue amiodarone - Bridged to warfarin, heparin gtt discontinued (INR 2.1 today) - SBP goal > 85 - Continue midodrine TID - Remains off of IV vasopressors/droxidopa - Trend CVP via tunneled central line  Acute renal failure on CKD III CRRT started 1/14, stopped 1/20, restarted 1/22, stopped 1/29. - Nephrology following, appreciate assistance - Off of CRRT since 1/29, plan for iHD today 1/31 - Will see how she tolerates as BP previously soft - Trend BMP - Replete electrolytes as indicated - Monitor I&Os - Avoid nephrotoxic agents as able - Ensure adequate renal perfusion  R/o Sepsis,  unclear source Shock resolved Pan scan 1/13 with patchy lower lobe opacities, no AP findings.  No definite source. Lines were replaced 1/24.  Off of meropenem since 1/25. BC and UC neg 1/13. - SBP goal > 85 - Remains off of vasopressors (on midodrine) - Trend WBC, fever curve (both stable) - Follow Cx data - Monitor off of antibiotics in the setting of continued slow clinical  improvement  Acute on chronic anemia Thrombocytopenia, potentially due to antibiotics, doubt sepsis, improving - Trend H&H, Plt - Monitor for signs of active bleeding - Transfuse for Hgb < 7.0 or hemodynamically significant bleeding - Last transfusion 1/27 (1U PRBCs)  Hyperglycemia/ prediabetes A1c 6 11/25/2023. - SSI - CBGs ACHS - Goal CBG 140-180  Goiter Off of methimazole, TSH WNL. - Outpatient endocrinology f/u  Situational depression Sleep disturbance - Continue Zoloft - Seroquel QHS, melatonin, trazodone PRN - Mirtazapine as above - Spiritual care consult placed - May benefit from trips off of the unit as able  Deep tissue injury on left buttocks approximately 2 cm in diameter purple discoloration - WOC consult - High protein diet to promote wound healing  Protein calorie malnutrition, severe Early satiety - Appreciate RD assistance - Cortrak clogged/removed 1/29 - May require replacement 1/31, pending further discussion/patient's wishes - Calorie count in place and unfortunately below goal - Mirtazapine for appetite stimulation - Reglan with marginal improvement in early satiety, can consider d/c  Physical deconditioning  - PT/OT, encourage OOB  Best Practice (right click and "Reselect all SmartList Selections" daily)   Diet/type: Encourage PO, supplement TF DVT prophylaxis: Warfarin Pressure ulcer(s): L buttocks  GI prophylaxis: N/A Lines: HD cath 1/24, tunneled CVC 1/24 Foley:  N/A Code Status:  full code Last date of multidisciplinary goals of care discussion [Pending]  Critical care time:   The patient is critically ill with multiple organ system  failure and requires high complexity decision making for assessment and support, frequent evaluation and titration of therapies, advanced monitoring, review of radiographic studies and interpretation of complex data.   Critical Care Time devoted to patient care services, exclusive of separately billable  procedures, described in this note is 35 minutes.  Tim Lair, PA-C [facility] 12/31/23 7:22 AM  Please see [email or web address] for pager details.  From 7A-7P if no response, please call [phone] After hours, please call ELink [phone]

## 2023-12-31 NOTE — Progress Notes (Signed)
Patient ID: Vanessa [name], female   DOB: 26-Jun-1951, 73 y.o.   MRN: [id number]      Advanced Heart Failure Rounding Note  Cardiologist: Dr. Shirlee Latch  Chief Complaint: Vasoplegia   Subjective:   12/30 S/P MVR (tissue) and TV ring and same day return to OR for tamponade/bleeding. 1/2: Progressive volume overload with poor UOP, CVVH started but ended up re-intubated.   Extubated 12/05/23 1/12: CVVH stopped 1/13: iHD, HD line removed 1/14: CRRT Restarted.  1/17: RHC mean RA 2, PA 47/20, mean PCWP 5, CI 1.74, PVR 8, PAPi 9.  Milrinone 0.125 started. 1/20 CRRT stopped.  1/22 methylene blue for persistent vasoplegia  1/24 tunneled HD cath placed. CRRT restarted 1/29 CRRT stopped  Still limited PO intake this morning, frustrated with length of stay. BP stable, will trial iHD today.   Remains anuric.  Objective:    Weight Range: 75.2 kg Body mass index is 29.37 kg/m.   Vital Signs:   Temp:  [96.9 F (36.1 C)-99.1 F (37.3 C)] 97.7 F (36.5 C) (01/31 0830) Pulse Rate:  [65-90] 73 (01/31 1015) Resp:  [12-49] 21 (01/31 1015) BP: (101-132)/(56-80) 122/76 (01/31 1000) SpO2:  [88 %-98 %] 92 % (01/31 1015) Weight:  [75.2 kg] 75.2 kg (01/31 0500) Last BM Date : 12/29/23  Weight change: Filed Weights   12/29/23 0500 12/30/23 0500 12/31/23 0500  Weight: 74.4 kg 74 kg 75.2 kg   Intake/Output:   Intake/Output Summary (Last 24 hours) at 12/31/2023 1036 Last data filed at 12/31/2023 0900 Gross per 24 hour  Intake 223.96 ml  Output --  Net 223.96 ml    CVP 11 Physical Exam   General:  Ill appearing elderly female HEENT: normal Neck: Tunneled dialysis catheter R internal jugular.  Cor: RRR, JVP mildly elevated, II/IV systolic murmur Lungs: diminished anteriorly Abdomen: soft, nontender, nondistended. Extremities: no cyanosis, clubbing, rash, edema Neuro: alert & orientedx3. Affect depressed    Telemetry   SR 60-80s  Labs    CBC Recent Labs    12/30/23 0419  12/31/23 0519  WBC 8.0 7.6  HGB 8.0* 8.5*  HCT 26.6* 27.3*  MCV 105.1* 103.8*  PLT 157 174   Basic Metabolic Panel Recent Labs    64/40/34 0419 12/31/23 0519  NA 134* 135  K 4.6 5.0  CL 99 98  CO2 27 24  GLUCOSE 101* 100*  BUN 33* 51*  CREATININE 2.11* 3.67*  CALCIUM 8.5* 8.8*  MG 2.9* 3.0*  PHOS 4.5 6.8*   Liver Function Tests Recent Labs    12/30/23 0419 12/31/23 0519  ALBUMIN 2.5* 2.5*   No results for input(s): "LIPASE", "AMYLASE" in the last 72 hours. Cardiac Enzymes No results for input(s): "CKTOTAL", "CKMB", "CKMBINDEX", "TROPONINI" in the last 72 hours.  BNP: BNP (last 3 results) Recent Labs    05/26/23 1610 08/26/23 1103 09/22/23 0905  BNP 778.0* 781.0* 926.0*   Other results:  Imaging   No results found.  Medications:    Scheduled Medications:  amiodarone  200 mg Oral Daily   ascorbic acid  250 mg Oral BID   Chlorhexidine Gluconate Cloth  6 each Topical Daily   darbepoetin (ARANESP) injection - NON-DIALYSIS  100 mcg Subcutaneous Q Tue-1800   feeding supplement  237 mL Oral TID BM   fiber supplement (BANATROL TF)  60 mL Per Tube BID   Gerhardt's butt cream   Topical BID   influenza vaccine adjuvanted  0.5 mL Intramuscular Tomorrow-1000   insulin aspart  0-24 Units  Subcutaneous Q4H   metoCLOPramide (REGLAN) injection  5 mg Intravenous Q8H   midodrine  20 mg Oral Q8H   mirtazapine  7.5 mg Oral QHS   multivitamin  1 tablet Oral BID   QUEtiapine  25 mg Oral QHS   sertraline  25 mg Oral Daily   warfarin  5 mg Oral ONCE-1600   Warfarin - Pharmacist Dosing Inpatient   Does not apply q1600    Infusions:  sodium chloride Stopped (12/26/23 1005)   anticoagulant sodium citrate      PRN Medications: sodium chloride, alteplase, anticoagulant sodium citrate, artificial tears, bisacodyl **OR** bisacodyl, feeding supplement, heparin, heparin, HYDROmorphone (DILAUDID) injection, levalbuterol, lidocaine (PF), lidocaine-prilocaine, lip balm,  melatonin, ondansetron (ZOFRAN) IV, mouth rinse, oxyCODONE, pentafluoroprop-tetrafluoroeth, pneumococcal 20-valent conjugate vaccine, sodium chloride, sodium chloride, traZODone  Patient Profile   Vanessa [name] is a 73 y.o. with history of nonischemic cardiomyopathy and severe MR s/p Mitral Clip x2 (2020), pHTN, and CKD IIIb.   Assessment/Plan   1. Post Cardiotomy Cardiogenic & septic shock: Bioprosthetic MVR and tricuspid ring 11/29/23 with Dr. Leafy Ro. Pre-op cath with no significant CAD.  Pre-op echo with EF 40-45%, severe MR. Went back to the OR with post-op tamponade for clean-out.  Echo 12/31 showed EF 25-30%, moderate RV dysfunction, s/p bioprosthetic mitral valve replacement with trivial MR and tricuspid valve repair with trivial TR. Worsened on 1/2 and re-intubated in setting of volume overload, oliguric renal failure &  pulmonary hypertension. Extubated on 12/04/22. 1/13 CT C/A/P  moderate hemorrhagic pericardial effusion and 5.3 cm hematoma right heart border.  - Post -op echo 12/14/23 EF 35-40% RV mod reduced. Valves stable - RHC 1/17 showed RA 3 PA 47/20 (30) PCWP 5 PAPi 9 CI 1.74 - c/w vasoplegia. Remains on midodrine 20 TID.  - CVP 11-12 today. Will trial iHD - Continue compression hose  2. Mitral regurgitation: s/p Mitraclip x 2 1/20. Echo 9/24 showed MV s/p Mitraclip x 2 with mean gradient 6 mmHg and severe mitral regurgitation.  She was not thought to be a candidate for an additional Mitraclip due to lack of room between the two existing clips. S/p MV replacement and Tricuspid repair on 11/29/23 with Dr. Leafy Ro.  - Last echo stable valves.   3. Ventricular ectopy:  - Quiescent now  - Continue amiodarone 200 mg bid   4. Pulmonary hypertension:  Suspect mixed PAH/PVH with elevated PCWP.  NO was tried at 10 ppm but did not have much effect so now off.  RHC 1/17 with mild pulmonary arterial hypertension.   5. 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 TSH was normal  in 8/24. Follows with Endocrinology  6. Acute renal failure:  1/14 CRRT restarted. CRRT stopped 1/20 and restarted 1/22. Remains anuric.  - Remains anuric. CVP 12. Has TDC. - Off CRRT. Planning HD today as above - Nephrology following.    7. Blood loss anemia - worsening be renal failure - 1u RBCs01/27 for hgb 7.2, hgb stable at 8.5 - Continue to monitor  8. Complete heart block: She had post-op CHB. Resolved.   9. Hypoxic / Hypercapnic respiratory failure: Reintubated 01/02.  Extubated 01/05.  - Stable sats on 3L [place]  10. Paroxsymal Atrial fibrillation:  - In SR on po amio.   - On Heparin drip.  - Warfarin dosing discussed with PharmD. INR 2.1 today. Stop warfarin  11. ID: Suspected septic shock component, no definite source. All lines changed out and started on linezolid/meropenem.  Afebrile. Leukocytosis resolved  -  Now off abx  12. Thrombocytopenia: Suspect due to sepsis. Resolved.  13. Deconditioning - Continue aggressive PT/OT. Needs to get out of bed today. - will likely need inpatient rehab at discharge  14. Protein-calorie malnutrition - Cor-trak clotted and was removed on 01/29 - Still encouraging PO intake   Romie Minus, MD 12/31/23  Advanced Heart Failure Team Pager [phone] (M-F; 7a - 5p)  Please contact [facility] for night-coverage after hours (4p -7a ) and weekends on [email or web address]

## 2023-12-31 NOTE — Plan of Care (Signed)
  Problem: Education: Goal: Knowledge of General Education information will improve Description: Including pain rating scale, medication(s)/side effects and non-pharmacologic comfort measures Outcome: Progressing   Problem: Health Behavior/Discharge Planning: Goal: Ability to manage health-related needs will improve Outcome: Progressing   Problem: Clinical Measurements: Goal: Ability to maintain clinical measurements within normal limits will improve Outcome: Progressing Goal: Will remain free from infection Outcome: Progressing Goal: Diagnostic test results will improve Outcome: Progressing Goal: Respiratory complications will improve Outcome: Progressing Goal: Cardiovascular complication will be avoided Outcome: Progressing   Problem: Activity: Goal: Risk for activity intolerance will decrease Outcome: Progressing   Problem: Nutrition: Goal: Adequate nutrition will be maintained Outcome: Progressing   Problem: Coping: Goal: Level of anxiety will decrease Outcome: Progressing   Problem: Elimination: Goal: Will not experience complications related to bowel motility Outcome: Progressing   Problem: Pain Management: Goal: General experience of comfort will improve Outcome: Progressing   Problem: Safety: Goal: Ability to remain free from injury will improve Outcome: Progressing   Problem: Skin Integrity: Goal: Risk for impaired skin integrity will decrease Outcome: Progressing   Problem: Education: Goal: Will demonstrate proper wound care and an understanding of methods to prevent future damage Outcome: Progressing Goal: Knowledge of disease or condition will improve Outcome: Progressing Goal: Knowledge of the prescribed therapeutic regimen will improve Outcome: Progressing Goal: Individualized Educational Video(s) Outcome: Progressing   Problem: Activity: Goal: Risk for activity intolerance will decrease Outcome: Progressing   Problem: Cardiac: Goal: Will  achieve and/or maintain hemodynamic stability Outcome: Progressing   Problem: Clinical Measurements: Goal: Postoperative complications will be avoided or minimized Outcome: Progressing   Problem: Respiratory: Goal: Respiratory status will improve Outcome: Progressing   Problem: Skin Integrity: Goal: Wound healing without signs and symptoms of infection Outcome: Progressing Goal: Risk for impaired skin integrity will decrease Outcome: Progressing   Problem: Education: Goal: Will demonstrate proper wound care and an understanding of methods to prevent future damage Outcome: Progressing Goal: Knowledge of disease or condition will improve Outcome: Progressing Goal: Knowledge of the prescribed therapeutic regimen will improve Outcome: Progressing Goal: Individualized Educational Video(s) Outcome: Progressing   Problem: Activity: Goal: Risk for activity intolerance will decrease Outcome: Progressing   Problem: Cardiac: Goal: Will achieve and/or maintain hemodynamic stability Outcome: Progressing   Problem: Clinical Measurements: Goal: Postoperative complications will be avoided or minimized Outcome: Progressing   Problem: Respiratory: Goal: Respiratory status will improve Outcome: Progressing   Problem: Skin Integrity: Goal: Wound healing without signs and symptoms of infection Outcome: Progressing Goal: Risk for impaired skin integrity will decrease Outcome: Progressing   Problem: Urinary Elimination: Goal: Ability to achieve and maintain adequate renal perfusion and functioning will improve Outcome: Progressing   Problem: Education: Goal: Understanding of CV disease, CV risk reduction, and recovery process will improve Outcome: Progressing Goal: Individualized Educational Video(s) Outcome: Progressing   Problem: Activity: Goal: Ability to return to baseline activity level will improve Outcome: Progressing   Problem: Cardiovascular: Goal: Ability to achieve  and maintain adequate cardiovascular perfusion will improve Outcome: Progressing Goal: Vascular access site(s) Level 0-1 will be maintained Outcome: Progressing   Problem: Health Behavior/Discharge Planning: Goal: Ability to safely manage health-related needs after discharge will improve Outcome: Progressing

## 2023-12-31 NOTE — Progress Notes (Signed)
PHARMACY - ANTICOAGULATION CONSULT NOTE  Pharmacy Consult for IV heparin + warfarin Indication: atrial fibrillation  No Known Allergies  Patient Measurements: Height: 5\' 3"  (160 cm) Weight: 75.2 kg (165 lb 12.6 oz) IBW/kg (Calculated) : 52.4 Heparin Dosing Weight: ~ 70 kg  Vital Signs: Temp: 96.9 F (36.1 C) (01/31 0400) Temp Source: Oral (01/31 0400) BP: 120/64 (01/31 0500) Pulse Rate: 68 (01/31 0500)  Labs: Recent Labs    12/29/23 0414 12/29/23 1703 12/30/23 0419 12/31/23 0519  HGB 8.5*  --  8.0* 8.5*  HCT 27.8*  --  26.6* 27.3*  PLT 152  --  157 174  LABPROT 18.0*  --  19.7* 23.9*  INR 1.5*  --  1.7* 2.1*  HEPARINUNFRC 0.61  --  0.52 0.56  CREATININE 0.97 1.37* 2.11* 3.67*    Estimated Creatinine Clearance: 13.5 mL/min (A) (by C-G formula based on SCr of 3.67 mg/dL (H)).  Warfarin doses received (INR): 1/24 - 2.5 mg, 1.2 1/25 - 2.5 mg, 1.1 1/26 - 5 mg, 1.2 1/27 - 5 mg, 1.3   Assessment: 73 yo female s/p scheduled bioprosthetic mitral valve replacement with tissue valve 12/30 now with afib, pharmacy asked to dose anticoagulation with IV heparin. Spontaneously converted 1/7; flipped back to AF overnight 1/9 0300. Baseline INR 1.1, slow uptrend to 1.3 s/p 6 days warfarin therapy, total dose 20 mg, then held 1/21-24 for tunneled access plans. Patient now s/p [facility] and tunneled central line placement 1/24 AM on CRRT, long-term dialysis plan pending.  CBC remains low-stable, off norepinephrine since 1/26, remains on midodrine. Resumed anticoagulation per VIR after tunneled cath placement.  Heparin level is therapeutic at 0.56, on 1000 units/hr. Hgb 8.5, plt 174. INR is therapeutic at 2.1. Minimal oral intake - received 2 ensure yesterday. No s/sx of bleeding or infusion issues.   Goal of Therapy:  Heparin level 0.3 - 0.7 units INR goal 2 - 3 (bMVR) Monitor platelets by anticoagulation protocol: Yes   Plan:  Stop heparin infusion now that INR is therapeutic  Order  warfarin 4 mg x1 Daily CBC and INR.  Thank you for allowing pharmacy to participate in this patient's care,  Sherron Monday, PharmD, BCCCP Clinical Pharmacist  Phone: [phone] 12/31/2023 8:13 AM  Please check AMION for all [facility] phone numbers After 10:00 PM, call Main Pharmacy [phone]

## 2023-12-31 NOTE — Progress Notes (Signed)
Received patient  in bed.Awake,alert and oriented  x 4.Consent verified.  Access used: Right HD catheter that worked well.Dressing on date.  Duration of treatment : 3.5 HOURS.  Net uf- Met 3 liters  Tolerated treatment:SBP never went down below 110 on her entire treatment.  Medicines given : Scheduled Midodrine 20 mg.  Hand off to the patient's nurse with stable medical condition.

## 2023-12-31 NOTE — Plan of Care (Signed)
  Problem: Education: Goal: Knowledge of General Education information will improve Description: Including pain rating scale, medication(s)/side effects and non-pharmacologic comfort measures Outcome: Progressing   Problem: Clinical Measurements: Goal: Ability to maintain clinical measurements within normal limits will improve Outcome: Progressing Goal: Will remain free from infection Outcome: Progressing Goal: Respiratory complications will improve Outcome: Progressing Goal: Cardiovascular complication will be avoided Outcome: Progressing   Problem: Activity: Goal: Risk for activity intolerance will decrease Outcome: Progressing   Problem: Pain Management: Goal: General experience of comfort will improve Outcome: Progressing   Problem: Safety: Goal: Ability to remain free from injury will improve Outcome: Progressing   Problem: Nutrition: Goal: Adequate nutrition will be maintained Outcome: Not Progressing

## 2023-12-31 NOTE — Progress Notes (Signed)
Physical Therapy Treatment Patient Details Name: Vanessa [name] MRN: [id number] DOB: Feb 02, 1951 Today's Date: 12/31/2023   History of Present Illness 73 yo F admitted 12/30 with DOE, increasing MR and TR. LCH without CAD. 12/30 MVR and tricuspid valve repair. Intubated 1/2-1/5. 1/2 CRRT started/stopped on 1/12. Pt HD line was removed on 1/13 and CRRT was re-started on 1/14-1/20, CRRT restarted on 1/22 and stopped 1/29.  PMhx: HTN, NICM, HFrEF (EF40-45%), Pulmonary HTN, CKD and  severe MR s/p prior mitral clip x 2 in 2020.    PT Comments  Pt is making slow progress towards goals with multiple fluctuations in functional ability. Pt was Mod A +2 for sit to stand from elevated bed this session and then was unable to stand from recliner despite 2 person Max A. Pt was Mod A for bed mobility. Encouragement to sit up in chair at end of session; pt agreeable following education. Due to pt current functional status, home set up and available assistance at home recommending skilled physical therapy services > 3 hours/day in order to address strength, balance and functional mobility to decrease risk for falls, injury, immobility, skin break down and re-hospitalization.      If plan is discharge home, recommend the following: Assistance with cooking/housework;Help with stairs or ramp for entrance     Equipment Recommendations  Rolling walker (2 wheels);BSC/3in1       Precautions / Restrictions Precautions Precautions: Sternal;Fall;Other (comment) Precaution Booklet Issued: No Precaution Comments: Sternal precautions reviewed verbally with pt demonstrating good carryover of training from prior sessions this day (1/29) Restrictions Weight Bearing Restrictions Per Provider Order: No RUE Weight Bearing Per Provider Order: Non weight bearing LUE Weight Bearing Per Provider Order: Non weight bearing Other Position/Activity Restrictions: sternal precautions     Mobility  Bed Mobility Overal bed mobility:  Needs Assistance Bed Mobility: Supine to Sit Rolling: Mod assist   Supine to sit: HOB elevated, Mod assist     General bed mobility comments: pt able move LEs to EOB, due to inability to push/pull with UEs pt continue to require modA for trunk elevation and to scoot to EOB    Transfers Overall transfer level: Needs assistance Equipment used: None Transfers: Sit to/from Stand Sit to Stand: Mod assist, +2 physical assistance, +2 safety/equipment, Max assist, From elevated surface           General transfer comment: pt with fear of falling and resistant to leaning forward despite max encouragement and plus 2 help to power up, modAx2 to power up from very elevated EOB. Pt then attempted standing from recliner with 2 person Max A for 3 trials and unable to clear hips.    Ambulation/Gait   Gait Distance (Feet): 40 Feet Assistive device: Fara Boros Gait Pattern/deviations: Trunk flexed, Step-to pattern, Decreased step length - right, Decreased step length - left, Decreased stride length Gait velocity: decreased Gait velocity interpretation: <1.31 ft/sec, indicative of household ambulator   General Gait Details: minA to control eva walker       Balance Overall balance assessment: Needs assistance Sitting-balance support: Single extremity supported, No upper extremity supported, Feet supported Sitting balance-Leahy Scale: Good Sitting balance - Comments: close Supervision for safety with pt sitting EOB but no LOB noted this session   Standing balance support: Single extremity supported, Bilateral upper extremity supported, Reliant on assistive device for balance Standing balance-Leahy Scale: Poor Standing balance comment: reliant on external support          Cognition Arousal: Alert Behavior During  Therapy: WFL for tasks assessed/performed, Flat affect Overall Cognitive Status: Within Functional Limits for tasks assessed       General Comments: Pt continues with  fluctuating mood. Intermittently up and down today. Continues with difficulty maintaining sternal precautions           General Comments General comments (skin integrity, edema, etc.): VS remained stable throughout functional mobility.      Pertinent Vitals/Pain Pain Assessment Pain Assessment: No/denies pain     PT Goals (current goals can now be found in the care plan section) Acute Rehab PT Goals Patient Stated Goal: feel better PT Goal Formulation: With patient Time For Goal Achievement: 01/03/24 Potential to Achieve Goals: Good Progress towards PT goals: Progressing toward goals    Frequency    Min 1X/week      PT Plan  Continue with current POC        AM-PAC PT "6 Clicks" Mobility   Outcome Measure  Help needed turning from your back to your side while in a flat bed without using bedrails?: A Lot Help needed moving from lying on your back to sitting on the side of a flat bed without using bedrails?: A Lot Help needed moving to and from a bed to a chair (including a wheelchair)?: Total Help needed standing up from a chair using your arms (e.g., wheelchair or bedside chair)?: Total Help needed to walk in hospital room?: A Lot Help needed climbing 3-5 steps with a railing? : Total 6 Click Score: 9    End of Session Equipment Utilized During Treatment: Oxygen;Gait belt Activity Tolerance: Patient limited by fatigue Patient left: with call bell/phone within reach;in chair Nurse Communication: Mobility status PT Visit Diagnosis: Other abnormalities of gait and mobility (R26.89);Muscle weakness (generalized) (M62.81);Difficulty in walking, not elsewhere classified (R26.2)     Time: 1610-9604 PT Time Calculation (min) (ACUTE ONLY): 39 min  Charges:    $Gait Training: 8-22 mins $Therapeutic Activity: 23-37 mins PT General Charges $$ ACUTE PT VISIT: 1 Visit                     Harrel Carina, DPT, CLT  Acute Rehabilitation Services Office:  [phone] (Secure chat preferred)    Claudia Desanctis 12/31/2023, 12:18 PM

## 2024-01-01 DIAGNOSIS — Z952 Presence of prosthetic heart valve: Secondary | ICD-10-CM | POA: Diagnosis not present

## 2024-01-01 DIAGNOSIS — R570 Cardiogenic shock: Secondary | ICD-10-CM | POA: Diagnosis not present

## 2024-01-01 DIAGNOSIS — N179 Acute kidney failure, unspecified: Secondary | ICD-10-CM | POA: Diagnosis not present

## 2024-01-01 DIAGNOSIS — I4891 Unspecified atrial fibrillation: Secondary | ICD-10-CM | POA: Diagnosis not present

## 2024-01-01 DIAGNOSIS — N183 Chronic kidney disease, stage 3 unspecified: Secondary | ICD-10-CM | POA: Diagnosis not present

## 2024-01-01 DIAGNOSIS — I5023 Acute on chronic systolic (congestive) heart failure: Secondary | ICD-10-CM | POA: Diagnosis not present

## 2024-01-01 LAB — GLUCOSE, CAPILLARY
Glucose-Capillary: 99 mg/dL (ref 70–99)
Glucose-Capillary: 108 mg/dL — ABNORMAL HIGH (ref 70–99)
Glucose-Capillary: 131 mg/dL — ABNORMAL HIGH (ref 70–99)
Glucose-Capillary: 84 mg/dL (ref 70–99)
Glucose-Capillary: 125 mg/dL — ABNORMAL HIGH (ref 70–99)
Glucose-Capillary: 121 mg/dL — ABNORMAL HIGH (ref 70–99)

## 2024-01-01 LAB — CBC
HCT: 30.6 % — ABNORMAL LOW (ref 36.0–46.0)
Hemoglobin: 9.6 g/dL — ABNORMAL LOW (ref 12.0–15.0)
MCH: 32.1 pg (ref 26.0–34.0)
MCHC: 31.4 g/dL (ref 30.0–36.0)
MCV: 102.3 fL — ABNORMAL HIGH (ref 80.0–100.0)
Platelets: 155 10*3/uL (ref 150–400)
RBC: 2.99 MIL/uL — ABNORMAL LOW (ref 3.87–5.11)
RDW: 21.3 % — ABNORMAL HIGH (ref 11.5–15.5)
WBC: 7.4 10*3/uL (ref 4.0–10.5)
nRBC: 0 % (ref 0.0–0.2)

## 2024-01-01 LAB — RENAL FUNCTION PANEL
Albumin: 2.8 g/dL — ABNORMAL LOW (ref 3.5–5.0)
Anion gap: 16 — ABNORMAL HIGH (ref 5–15)
BUN: 22 mg/dL (ref 8–23)
CO2: 27 mmol/L (ref 22–32)
Calcium: 9.3 mg/dL (ref 8.9–10.3)
Chloride: 92 mmol/L — ABNORMAL LOW (ref 98–111)
Creatinine, Ser: 2.75 mg/dL — ABNORMAL HIGH (ref 0.44–1.00)
GFR, Estimated: 18 mL/min — ABNORMAL LOW (ref >60)
Glucose, Bld: 108 mg/dL — ABNORMAL HIGH (ref 70–99)
Phosphorus: 5.7 mg/dL — ABNORMAL HIGH (ref 2.5–4.6)
Potassium: 4.4 mmol/L (ref 3.5–5.1)
Sodium: 135 mmol/L (ref 135–145)

## 2024-01-01 LAB — PROTIME-INR
INR: 2.3 — ABNORMAL HIGH (ref 0.8–1.2)
Prothrombin Time: 25.6 s — ABNORMAL HIGH (ref 11.4–15.2)

## 2024-01-01 LAB — MAGNESIUM: Magnesium: 2.5 mg/dL — ABNORMAL HIGH (ref 1.7–2.4)

## 2024-01-01 MED ORDER — WARFARIN SODIUM 2 MG PO TABS
2.0000 mg | ORAL_TABLET | Freq: Every day | ORAL | Status: DC
Start: 2024-01-01 — End: 2024-01-07
  Administered 2024-01-01 – 2024-01-06 (×6): 2 mg via ORAL
  Filled 2024-01-01 (×6): qty 1

## 2024-01-01 MED ORDER — INSULIN ASPART 100 UNIT/ML IJ SOLN
0.0000 [IU] | INTRAMUSCULAR | Status: DC
  Administered 2024-01-01 (×2): 2 [IU] via SUBCUTANEOUS

## 2024-01-01 MED ORDER — POLYETHYLENE GLYCOL 3350 17 G PO PACK
17.0000 g | PACK | Freq: Every day | ORAL | Status: DC
  Administered 2024-01-01 – 2024-01-11 (×6): 17 g via ORAL
  Filled 2024-01-01 (×8): qty 1

## 2024-01-01 MED ORDER — METOCLOPRAMIDE HCL 5 MG PO TABS
5.0000 mg | ORAL_TABLET | Freq: Three times a day (TID) | ORAL | Status: DC
  Administered 2024-01-01 – 2024-01-04 (×7): 5 mg via ORAL
  Filled 2024-01-01 (×7): qty 1

## 2024-01-01 NOTE — Progress Notes (Signed)
Patient ID: Vanessa [name], female   DOB: May 09, 1951, 73 y.o.   MRN: [id number]   S: No new complaints this am.  No UOP-   underwent HD yesterday with 3 liters removed-   remained hemodynamically stable !  O:BP 103/60   Pulse 71   Temp 98 F (36.7 C) (Oral)   Resp (!) 21   Ht 5\' 3"  (1.6 m)   Wt 73 kg   SpO2 99%   BMI 28.51 kg/m   Intake/Output Summary (Last 24 hours) at 01/01/2024 0707 Last data filed at 12/31/2023 1817 Gross per 24 hour  Intake 42.28 ml  Output 3000 ml  Net -2957.72 ml   Intake/Output: I/O last 3 completed shifts: In: 157 [I.V.:157] Out: 3000 [Other:3000]  Intake/Output this shift:  No intake/output data recorded. Weight change: 0.8 kg Gen: NAD CVS: RRR Resp:CTA Abd: +BS, soft, NT/ND Ext:  trace to 1+ edema when examined in the bed   Recent Labs  Lab 12/27/23 1528 12/28/23 0427 12/28/23 1551 12/29/23 0414 12/29/23 1703 12/30/23 0419 12/31/23 0519  NA 135 132* 134* 135 135 134* 135  K 5.0 4.2 3.8 3.5 4.2 4.6 5.0  CL 102 99 100 99 98 99 98  CO2 25 26 26 25 26 27 24   GLUCOSE 120* 127* 99 104* 131* 101* 100*  BUN 18 20 18 16  25* 33* 51*  CREATININE 1.02* 1.04* 0.98 0.97 1.37* 2.11* 3.67*  ALBUMIN 2.4* 2.3* 2.5* 2.3* 2.5* 2.5* 2.5*  CALCIUM 8.1* 7.9* 8.2* 8.0* 8.4* 8.5* 8.8*  PHOS 2.3* 3.1 1.7* 4.2 3.2 4.5 6.8*   Liver Function Tests: Recent Labs  Lab 12/29/23 1703 12/30/23 0419 12/31/23 0519  ALBUMIN 2.5* 2.5* 2.5*   No results for input(s): "LIPASE", "AMYLASE" in the last 168 hours. No results for input(s): "AMMONIA" in the last 168 hours. CBC: Recent Labs  Lab 12/27/23 2254 12/28/23 0427 12/29/23 0414 12/30/23 0419 12/31/23 0519  WBC 8.2 8.0 7.5 8.0 7.6  HGB 8.4* 8.5* 8.5* 8.0* 8.5*  HCT 27.7* 27.9* 27.8* 26.6* 27.3*  MCV 105.3* 104.1* 103.0* 105.1* 103.8*  PLT 147* 144* 152 157 174   Cardiac Enzymes: No results for input(s): "CKTOTAL", "CKMB", "CKMBINDEX", "TROPONINI" in the last 168 hours. CBG: Recent Labs  Lab  12/31/23 1229 12/31/23 1548 12/31/23 1945 12/31/23 2303 01/01/24 0347  GLUCAP 125* 79 151* 107* 99    Iron Studies: No results for input(s): "IRON", "TIBC", "TRANSFERRIN", "FERRITIN" in the last 72 hours. Studies/Results: No results found.  amiodarone  200 mg Oral Daily   ascorbic acid  250 mg Oral BID   Chlorhexidine Gluconate Cloth  6 each Topical Daily   darbepoetin (ARANESP) injection - NON-DIALYSIS  100 mcg Subcutaneous Q Tue-1800   feeding supplement  237 mL Oral TID BM   fiber supplement (BANATROL TF)  60 mL Per Tube BID   Gerhardt's butt cream   Topical BID   influenza vaccine adjuvanted  0.5 mL Intramuscular Tomorrow-1000   insulin aspart  0-24 Units Subcutaneous Q4H   metoCLOPramide (REGLAN) injection  5 mg Intravenous Q8H   midodrine  20 mg Oral Q8H   mirtazapine  7.5 mg Oral QHS   multivitamin  1 tablet Oral BID   QUEtiapine  25 mg Oral QHS   sertraline  25 mg Oral Daily   Warfarin - Pharmacist Dosing Inpatient   Does not apply q1600    BMET    Component Value Date/Time   NA 135 12/31/2023 0519   K 5.0 12/31/2023  0519   CL 98 12/31/2023 0519   CO2 24 12/31/2023 0519   GLUCOSE 100 (H) 12/31/2023 0519   BUN 51 (H) 12/31/2023 0519   CREATININE 3.67 (H) 12/31/2023 0519   CREATININE 1.30 (H) 04/29/2016 1144   CALCIUM 8.8 (L) 12/31/2023 0519   GFRNONAA 13 (L) 12/31/2023 0519   GFRAA 42 (L) 08/19/2020 1304   CBC    Component Value Date/Time   WBC 7.6 12/31/2023 0519   RBC 2.63 (L) 12/31/2023 0519   HGB 8.5 (L) 12/31/2023 0519   HCT 27.3 (L) 12/31/2023 0519   PLT 174 12/31/2023 0519   MCV 103.8 (H) 12/31/2023 0519   MCH 32.3 12/31/2023 0519   MCHC 31.1 12/31/2023 0519   RDW 22.1 (H) 12/31/2023 0519   LYMPHSABS 2.2 12/09/2023 1001   MONOABS 0.6 12/09/2023 1001   EOSABS 0.0 12/09/2023 1001   BASOSABS 0.2 (H) 12/09/2023 1001    Assessment/ Plan: Pt is a 73 y.o. yo female  with HTN, NICM, pulmonary hypertension, mitral and tricuspid valve disease who  present w/ surgery for valvular disease c/b shock, AHRF, and AKI    1)  Anuric AKI on CKD 3A: Multifactorial etiology including cardiorenal syndrome and ischemic ATN.  Started CRRT on 1/2 for fluid overload.  The CRRT was held on 1/12 because of worsening leukocytosis and poor access with plan to have line holiday. IHD on 1/13 with intra-dialytic hypotension and no UF.  Resumed CRRT on 1/14-1/20 (new cath on 1/14) to manage volume. CRRT 1/22- 12/29/23.  No UOP -> BladderScan on 1/22 0mL.  TDC placed 12/24/23 by Dr. Juel Burrow.  CRRT stopped 1/29 morning.  Bp remains soft but she tolerated IHD yest with 3 L UF with support of po midodrine 20 TID.  Plan will be to continue MWF schedule as needed.  Unfortunately still no UOP so no signs of renal recovery  2) #Multifactorial shock: Vasoplegic and cardioplegia catheter surgery.   - midodrine incr to 20mg  TID, milrinone off on 1/20, off pressors now. Cont mgmt per primary team   3) Acute exacerbation of heart failure with reduced ejection fraction: Volume overload. Optimized with CRRT.   UF as tolerated/needed with IHD    4) Severe MR and tricuspid valvular disease:  - Status post repair.  CT surgery managing   5) Postoperative cardiac tamponade:  - Continue monitoring per cardiology     6) Anemia: Related to blood loss and compounded by AKI and critical illness.    - Transfusions per primary team.   - aranesp 40 mcg weekly on Tuesdays -> incr to 100  7) Bones-  first time phos was up-  no action yet      Jennette Kettle [employer] [phone]

## 2024-01-01 NOTE — Progress Notes (Signed)
Patient ID: Vanessa [name], female   DOB: 1951/09/19, 73 y.o.   MRN: [id number]      Advanced Heart Failure Rounding Note  Cardiologist: Dr. Shirlee Latch  Chief Complaint: Vasoplegia   Subjective:   12/30 S/P MVR (tissue) and TV ring and same day return to OR for tamponade/bleeding. 1/2: Progressive volume overload with poor UOP, CVVH started but ended up re-intubated.   Extubated 12/05/23 1/12: CVVH stopped 1/13: iHD, HD line removed 1/14: CRRT Restarted.  1/17: RHC mean RA 2, PA 47/20, mean PCWP 5, CI 1.74, PVR 8, PAPi 9.  Milrinone 0.125 started. 1/20 CRRT stopped.  1/22 methylene blue for persistent vasoplegia  1/24 tunneled HD cath placed. CRRT restarted 1/29 CRRT stopped 1/31 Tolerated 3.5 hour session of iHD, 3L of fluid removed.   Eating cheerios, overall doing well. No complaints today. Nutrition still an issue.   Remains anuric.  Objective:    Weight Range: 68.6 kg Body mass index is 26.79 kg/m.   Vital Signs:   Temp:  [97.1 F (36.2 C)-98.5 F (36.9 C)] 98.2 F (36.8 C) (02/01 0800) Pulse Rate:  [69-89] 75 (02/01 0900) Resp:  [15-35] 20 (02/01 0900) BP: (84-134)/(54-96) 94/72 (02/01 0900) SpO2:  [90 %-100 %] 99 % (02/01 0900) Weight:  [68.6 kg-76 kg] 68.6 kg (02/01 0700) Last BM Date : 12/29/23  Weight change: Filed Weights   12/31/23 1446 12/31/23 1820 01/01/24 0700  Weight: 76 kg 73 kg 68.6 kg   Intake/Output:   Intake/Output Summary (Last 24 hours) at 01/01/2024 1001 Last data filed at 01/01/2024 0930 Gross per 24 hour  Intake 340 ml  Output 3000 ml  Net -2660 ml    CVP 11 Physical Exam   General:  Ill appearing elderly female HEENT: normal Neck: Tunneled dialysis catheter R internal jugular.  Cor: RRR, JVP mildly elevated, II/IV systolic murmur Lungs: diminished anteriorly Abdomen: soft, nontender, nondistended. Extremities: no cyanosis, clubbing, rash, edema Neuro: alert & orientedx3. Affect depressed    Telemetry   SR 60-80s  Labs     CBC Recent Labs    12/31/23 0519 01/01/24 0733  WBC 7.6 7.4  HGB 8.5* 9.6*  HCT 27.3* 30.6*  MCV 103.8* 102.3*  PLT 174 155   Basic Metabolic Panel Recent Labs    82/95/62 0519 01/01/24 0733  NA 135 135  K 5.0 4.4  CL 98 92*  CO2 24 27  GLUCOSE 100* 108*  BUN 51* 22  CREATININE 3.67* 2.75*  CALCIUM 8.8* 9.3  MG 3.0* 2.5*  PHOS 6.8* 5.7*   Liver Function Tests Recent Labs    12/31/23 0519 01/01/24 0733  ALBUMIN 2.5* 2.8*   No results for input(s): "LIPASE", "AMYLASE" in the last 72 hours. Cardiac Enzymes No results for input(s): "CKTOTAL", "CKMB", "CKMBINDEX", "TROPONINI" in the last 72 hours.  BNP: BNP (last 3 results) Recent Labs    05/26/23 1610 08/26/23 1103 09/22/23 0905  BNP 778.0* 781.0* 926.0*   Other results:  Imaging   No results found.  Medications:    Scheduled Medications:  amiodarone  200 mg Oral Daily   ascorbic acid  250 mg Oral BID   Chlorhexidine Gluconate Cloth  6 each Topical Daily   darbepoetin (ARANESP) injection - NON-DIALYSIS  100 mcg Subcutaneous Q Tue-1800   feeding supplement  237 mL Oral TID BM   fiber supplement (BANATROL TF)  60 mL Per Tube BID   Gerhardt's butt cream   Topical BID   influenza vaccine adjuvanted  0.5 mL  Intramuscular Tomorrow-1000   insulin aspart  0-24 Units Subcutaneous Q4H   metoCLOPramide  5 mg Oral TID AC   midodrine  20 mg Oral Q8H   mirtazapine  7.5 mg Oral QHS   multivitamin  1 tablet Oral BID   polyethylene glycol  17 g Oral Daily   QUEtiapine  25 mg Oral QHS   sertraline  25 mg Oral Daily   Warfarin - Pharmacist Dosing Inpatient   Does not apply q1600    Infusions:  sodium chloride Stopped (12/26/23 1005)   anticoagulant sodium citrate      PRN Medications: sodium chloride, alteplase, anticoagulant sodium citrate, artificial tears, bisacodyl **OR** bisacodyl, feeding supplement, heparin, HYDROmorphone (DILAUDID) injection, levalbuterol, lidocaine (PF), lidocaine-prilocaine, lip  balm, melatonin, ondansetron (ZOFRAN) IV, mouth rinse, oxyCODONE, pentafluoroprop-tetrafluoroeth, pneumococcal 20-valent conjugate vaccine, sodium chloride, sodium chloride, traZODone  Patient Profile   Vanessa [name] is a 73 y.o. with history of nonischemic cardiomyopathy and severe MR s/p Mitral Clip x2 (2020), pHTN, and CKD IIIb.   Assessment/Plan   1. Post Cardiotomy Cardiogenic & septic shock: Bioprosthetic MVR and tricuspid ring 11/29/23 with Dr. Leafy Ro. Pre-op cath with no significant CAD.  Pre-op echo with EF 40-45%, severe MR. Went back to the OR with post-op tamponade for clean-out.  Echo 12/31 showed EF 25-30%, moderate RV dysfunction, s/p bioprosthetic mitral valve replacement with trivial MR and tricuspid valve repair with trivial TR. Worsened on 1/2 and re-intubated in setting of volume overload, oliguric renal failure &  pulmonary hypertension. Extubated on 12/04/22. 1/13 CT C/A/P  moderate hemorrhagic pericardial effusion and 5.3 cm hematoma right heart border.  - Post -op echo 12/14/23 EF 35-40% RV mod reduced. Valves stable - RHC 1/17 showed RA 3 PA 47/20 (30) PCWP 5 PAPi 9 CI 1.74 - c/w vasoplegia. Remains on midodrine 20 TID.  - iHD session 1/31, did well - Continue compression hose  2. Mitral regurgitation: s/p Mitraclip x 2 1/20. Echo 9/24 showed MV s/p Mitraclip x 2 with mean gradient 6 mmHg and severe mitral regurgitation.  She was not thought to be a candidate for an additional Mitraclip due to lack of room between the two existing clips. S/p MV replacement and Tricuspid repair on 11/29/23 with Dr. Leafy Ro.  - Last echo stable valves.   3. Ventricular ectopy:  - Quiescent now  - Continue amiodarone 200 mg bid   4. Pulmonary hypertension:  Suspect mixed PAH/PVH with elevated PCWP.  NO was tried at 10 ppm but did not have much effect so now off.  RHC 1/17 with mild pulmonary arterial hypertension.   5. 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 TSH was  normal in 8/24. Follows with Endocrinology  6. Acute renal failure:  1/14 CRRT restarted. CRRT stopped 1/20 and restarted 1/22. Remains anuric.  - iHD session 1/31 - Nephrology following.    7. Blood loss anemia as well as due to renal failure, stable - Continue to monitor  8. Complete heart block: She had post-op CHB. Resolved.   9. Hypoxic / Hypercapnic respiratory failure: Reintubated 01/02.  Extubated 01/05.  - Stable sats on 3L [place]  10. Paroxsymal Atrial fibrillation:  - In SR on po amio.   - On Heparin drip.  - Warfarin dosing discussed with PharmD. INR 2.1 today. Stop warfarin  11. ID: Suspected septic shock component, no definite source. All lines changed out and started on linezolid/meropenem.  Afebrile. Leukocytosis resolved  - Now off abx  12. Thrombocytopenia: Suspect due to  sepsis. Resolved.  13. Deconditioning - Continue aggressive PT/OT. - will likely need inpatient rehab at discharge  14. Protein-calorie malnutrition - Cor-trak clotted and was removed on 01/29 - Still encouraging PO intake  Given improvement in hemodynamics and tolerating iHD HF will sign off at this time. Please reach out for further concerns during her admission.   Romie Minus, MD 01/01/24  Advanced Heart Failure Team Pager [phone] (M-F; 7a - 5p)  Please contact [facility] for night-coverage after hours (4p -7a ) and weekends on [email or web address]

## 2024-01-01 NOTE — Progress Notes (Signed)
PHARMACY - ANTICOAGULATION CONSULT NOTE  Pharmacy Consult for IV heparin > warfarin Indication: atrial fibrillation  No Known Allergies  Patient Measurements: Height: 5\' 3"  (160 cm) Weight: 68.6 kg (151 lb 3.8 oz) IBW/kg (Calculated) : 52.4 Heparin Dosing Weight: ~ 70 kg  Vital Signs: Temp: 98.2 F (36.8 C) (02/01 0800) Temp Source: Oral (02/01 0800) BP: 109/70 (02/01 1100) Pulse Rate: 75 (02/01 1100)  Labs: Recent Labs    12/30/23 0419 12/31/23 0519 01/01/24 0733  HGB 8.0* 8.5* 9.6*  HCT 26.6* 27.3* 30.6*  PLT 157 174 155  LABPROT 19.7* 23.9* 25.6*  INR 1.7* 2.1* 2.3*  HEPARINUNFRC 0.52 0.56  --   CREATININE 2.11* 3.67* 2.75*    Estimated Creatinine Clearance: 17.2 mL/min (A) (by C-G formula based on SCr of 2.75 mg/dL (H)).  Warfarin doses received (INR): 1/24 - 2.5 mg, 1.2 1/25 - 2.5 mg, 1.1 1/26 - 5 mg, 1.2 1/27 - 5 mg, 1.3   Assessment: 73 yo female s/p scheduled bioprosthetic mitral valve replacement with tissue valve 12/30 now with afib, pharmacy asked to dose anticoagulation with IV heparin. Spontaneously converted 1/7; flipped back to AF overnight 1/9 0300. Baseline INR 1.1, slow uptrend to 1.3 s/p 6 days warfarin therapy, total dose 20 mg, then held 1/21-24 for tunneled access plans. Patient now s/p [facility] and tunneled central line placement 1/24 AM on CRRT, long-term dialysis plan pending.  CBC remains low-stable, off norepinephrine since 1/26, remains on midodrine. Resumed anticoagulation per VIR after tunneled cath placement.  Heparin stopped 1/31 with INR >2 Hgb 8.-9 stable  plt 100s stable . INR is therapeutic at 2.3. Minimal oral intake - prn ensure, TF stopped 1/28 No s/sx of bleeding or infusion issues.   Goal of Therapy:  INR goal 2 - 3 (bMVR) Monitor platelets by anticoagulation protocol: Yes   Plan:  warfarin 2mg  daily Daily CBC and INR.   Leota Sauers Pharm.D. CPP, BCPS Clinical Pharmacist [phone] 01/01/2024 11:40 AM    Please  check AMION for all [facility] phone numbers After 10:00 PM, call Main Pharmacy [phone]

## 2024-01-01 NOTE — Progress Notes (Addendum)
NAME:  Vanessa [name], MRN:  [id number], DOB:  1951/02/24, LOS: 33 ADMISSION DATE:  11/29/2023, CONSULTATION DATE:  11/29/23 REFERRING MD: Leafy Ro - TCTS, CHIEF COMPLAINT: Postoperative management  History of Present Illness:  72yoF with PMH significant for HTN, NICM, HFrEF (EF40-45%), PHTN, CKD and hx of prior severe MR s/p prior mitral clip x 2 in 2020.  Pt with progressive DOE found to have increasing MR and moderate TR despite adjustments in GDMT.  Not felt to be candidate for additional mitral clip.  Repeat LHC without CAD but showed moderate PHTN.  Underwent mitral valve replacement with tissue valve and tricuspid valve repair by Dr. Leafy Ro on 12/30.  PCCM consulted to assist with vent and medical management post-operatively in ICU.   Pertinent Medical History:  Never smoker, MR w/ previous Mitral clip x 2 (2020), NICM, HFrEF, PHTN, HTN, CKD3b  Significant Hospital Events: Including procedures, antibiotic start and stop dates in addition to other pertinent events   12/30 MVR by Dr. Leafy Ro; relook for tamponade/washout later in evening without obvious source 1/2 Intubated for respiratory decompensation, iNO started. Vanc, meropenem started. CRRT started.  1/5 Extubated 1/8 Converted to NSR overnight 1/9 Back to A.fib around 0200 1/11 R introducer out.  Unable to thread right subclavian catheter 1/12 Remains weak still pressor dependent 1/13 Repeat BC sent. Still on pressors, Zosyn changed to meropenem.  Chest abdomen and pelvis CT obtained : Moderate hemorrhagic pericardial effusion, 5.3 cm hematoma right thrombus. Arterial gas bilateral patchy opacities mild dependent posterior right upper lobe atelectasis was unremarkable layering gallstones; neg for tamponade on repeat Echo 1/14 New right IJ HD catheter placed. Echo obtained to better evaluate her EF is 35 to 40%.  The LV demonstrates regional wall motion abnormalities there is asymmetric left ventricular hypertrophy of the inferior  lateral segment RV systolic function moderate regular reduced left atrial wall dilated no mention of clot.  1/15 Still on fairly high-dose norepinephrine 1/16 Cont CRRT + low dose NE  1/17 RHC confirms low output state, restarted on milrinone 1/20 CRRT stopped 1/21: D/c milrinone yesterday, ongoing increase NE requirements during nights. Started droxidopa in addition to midodrine to aid off NE.  1/22: Levo up to 18 overnight (max so far), cvp 9, restarting crrt today. Received dose methylene blue, Cortisol 21 1/23: Levo down to 9, CVP 8 1/24: Tunneled HD and tunneled dual lumen LIJ CVC placed  1/27: 1U PRBC given for Hgb 7.2 and hypotension. Off levo.  1/29: CRRT stopped. Cortrak clogged, removed. 1/31 tolerated iHD  Interim History / Subjective:   No events overnight.  Tolerated diet Objective:  Blood pressure 107/61, pulse 72, temperature 98 F (36.7 C), temperature source Oral, resp. rate (!) 23, height 5\' 3"  (1.6 m), weight 68.6 kg, SpO2 97%. CVP:  [5 mmHg-12 mmHg] 7 mmHg      Intake/Output Summary (Last 24 hours) at 01/01/2024 4098 Last data filed at 12/31/2023 1817 Gross per 24 hour  Intake 42.28 ml  Output 3000 ml  Net -2957.72 ml   Filed Weights   12/31/23 1446 12/31/23 1820 01/01/24 0700  Weight: 76 kg 73 kg 68.6 kg   Physical Examination: General Pleasant 73 year old female patient resting in bed no acute distress this morning HEENT normocephalic atraumatic no jugular venous distention is appreciated Pulmonary: Clear to auscultation remains on 3 L/min nasal cannula pulse oximetry mid 90s no accessory use Cardiac: Faint systolic heart murmur, regular rate and rhythm Abdomen: Soft nontender Extremities: Warm dry brisk capillary refill no  edema Neuro: Awake oriented no focal deficits generalized weakness is appreciated.  Resolved Hospital Problem List:   Postoperative vent management Postop tamponade resolved after relook and washout on day 1 Cardiogenic shock Severe  mitral regurg  Acute on chronic systolic HF  Toxic multi nodule goiter Complete heart block Acute hypercarbic respiratory failure Septic shock (no source identified) Thrombocytopenia Assessment & Plan:    NICM/HFrEF, Right heart failure, pHTN  and persistent and persistent vasoplegia s/p MR and TR w/prolonged post-operative course She has remained off vasopressors and droxidopa now for 48 hours  plan SBP goal greater than 85 Continuing midodrine 3 times a day Trending central venous catheter via tunneled central line Advanced heart failure team following  PAF/aflutter Plan Cont amiodarone Successfully bridged from heparin to warfarin Continue telemetry monitoring  Acute renal failure on CKD III/probable progression to end-stage renal disease CRRT started 1/14, stopped 1/20, restarted 1/22, stopped 1/29.  And now tolerating intermittent dialysis - Nephrology following, her tunneled catheter was placed on 1/24.   Plan Continuing Monday Wednesday Friday schedule for dialysis Continuing midodrine 20 mg 3 times daily Renal dose medications Strict intake output  Acute on chronic anemia hemodynamically stable and without evidence of acute bleeding - Last transfusion 1/27 (1U PRBCs) Plan Continue to trend CBC  Hyperglycemia/ prediabetes A1c 6 11/25/2023.  Excellent glycemic control Plan Changing her sliding scale to AC/at bedtime  Goiter Off of methimazole, TSH WNL. Plan Needs outpatient follow-up with endocrine  Situational depression Sleep disturbance Plan Continue Zoloft Seroquel QHS, melatonin, trazodone PRN Mirtazapine as above Spiritual care consult placed May benefit from trips off of the unit as able  Deep tissue injury on left buttocks approximately 2 cm in diameter purple discoloration - WOC consulted plan High protein diet to promote wound healing and mobilization   Protein calorie malnutrition, severe Early satiety - Appreciate RD assistance -  Cortrak clogged/removed 1/29, still with poor p.o. intake Plan Diet has been liberalized, encouraging frequent and small meals throughout the day Ensure supplementation Renal MVI Plan is to see how she does over the weekend, reevaluate on 2/3 at which point he could consider either placement of Corpak or PEG Continuing her low-dose Reglan before meals  Physical deconditioning  Plan  PT/OT, encourage OOB  Best Practice (right click and "Reselect all SmartList Selections" daily)   Diet/type: Encourage PO, supplement TF DVT prophylaxis: Warfarin Pressure ulcer(s): L buttocks  GI prophylaxis: N/A Lines: HD cath 1/24, tunneled CVC 1/24 Foley:  N/A Code Status:  full code Last date of multidisciplinary goals of care discussion [Pending]  Critical care time:   My time 37 min.  Ready for transfer to progressive.   Simonne Martinet ACNP-BC [facility] Pulmonary/Critical Care Pager # [phone] OR # [phone] if no answer

## 2024-01-02 DIAGNOSIS — D649 Anemia, unspecified: Secondary | ICD-10-CM | POA: Diagnosis not present

## 2024-01-02 DIAGNOSIS — Z952 Presence of prosthetic heart valve: Secondary | ICD-10-CM | POA: Diagnosis not present

## 2024-01-02 DIAGNOSIS — N179 Acute kidney failure, unspecified: Secondary | ICD-10-CM | POA: Diagnosis not present

## 2024-01-02 DIAGNOSIS — I5022 Chronic systolic (congestive) heart failure: Secondary | ICD-10-CM

## 2024-01-02 DIAGNOSIS — I272 Pulmonary hypertension, unspecified: Secondary | ICD-10-CM | POA: Diagnosis not present

## 2024-01-02 LAB — CBC
HCT: 30 % — ABNORMAL LOW (ref 36.0–46.0)
Hemoglobin: 9.4 g/dL — ABNORMAL LOW (ref 12.0–15.0)
MCH: 31.9 pg (ref 26.0–34.0)
MCHC: 31.3 g/dL (ref 30.0–36.0)
MCV: 101.7 fL — ABNORMAL HIGH (ref 80.0–100.0)
Platelets: 150 10*3/uL (ref 150–400)
RBC: 2.95 MIL/uL — ABNORMAL LOW (ref 3.87–5.11)
RDW: 20.5 % — ABNORMAL HIGH (ref 11.5–15.5)
WBC: 8.2 10*3/uL (ref 4.0–10.5)
nRBC: 0 % (ref 0.0–0.2)

## 2024-01-02 LAB — RENAL FUNCTION PANEL
Albumin: 2.8 g/dL — ABNORMAL LOW (ref 3.5–5.0)
Anion gap: 14 (ref 5–15)
BUN: 38 mg/dL — ABNORMAL HIGH (ref 8–23)
CO2: 25 mmol/L (ref 22–32)
Calcium: 9.2 mg/dL (ref 8.9–10.3)
Chloride: 94 mmol/L — ABNORMAL LOW (ref 98–111)
Creatinine, Ser: 4.2 mg/dL — ABNORMAL HIGH (ref 0.44–1.00)
GFR, Estimated: 11 mL/min — ABNORMAL LOW (ref >60)
Glucose, Bld: 111 mg/dL — ABNORMAL HIGH (ref 70–99)
Phosphorus: 7.2 mg/dL — ABNORMAL HIGH (ref 2.5–4.6)
Potassium: 5 mmol/L (ref 3.5–5.1)
Sodium: 133 mmol/L — ABNORMAL LOW (ref 135–145)

## 2024-01-02 LAB — GLUCOSE, CAPILLARY
Glucose-Capillary: 97 mg/dL (ref 70–99)
Glucose-Capillary: 114 mg/dL — ABNORMAL HIGH (ref 70–99)
Glucose-Capillary: 128 mg/dL — ABNORMAL HIGH (ref 70–99)
Glucose-Capillary: 90 mg/dL (ref 70–99)
Glucose-Capillary: 120 mg/dL — ABNORMAL HIGH (ref 70–99)

## 2024-01-02 LAB — PROTIME-INR
INR: 2.4 — ABNORMAL HIGH (ref 0.8–1.2)
Prothrombin Time: 26.5 s — ABNORMAL HIGH (ref 11.4–15.2)

## 2024-01-02 LAB — MAGNESIUM: Magnesium: 2.7 mg/dL — ABNORMAL HIGH (ref 1.7–2.4)

## 2024-01-02 MED ORDER — INSULIN ASPART 100 UNIT/ML IJ SOLN
0.0000 [IU] | Freq: Three times a day (TID) | INTRAMUSCULAR | Status: DC
  Administered 2024-01-02: 2 [IU] via SUBCUTANEOUS
  Administered 2024-01-04: 3 [IU] via SUBCUTANEOUS
  Administered 2024-01-06 – 2024-01-12 (×5): 2 [IU] via SUBCUTANEOUS

## 2024-01-02 MED ORDER — CHLORHEXIDINE GLUCONATE CLOTH 2 % EX PADS
6.0000 | MEDICATED_PAD | Freq: Every day | CUTANEOUS | Status: DC
  Administered 2024-01-04 – 2024-01-12 (×9): 6 via TOPICAL

## 2024-01-02 NOTE — Progress Notes (Signed)
Patient ID: Vanessa [name], female   DOB: [birth year], 73 y.o.   MRN: [id number]   S: No new complaints this am-  moved to floor bed-  had a tiny bit of urine when having BM-  this is a first  O:BP 116/63 (BP Location: Left Arm)   Pulse 72   Temp (!) 97.5 F (36.4 C) (Oral)   Resp 20   Ht 5\' 3"  (1.6 m)   Wt 74.9 kg   SpO2 94%   BMI 29.25 kg/m  No intake or output data in the 24 hours ending 01/02/24 1003  Intake/Output: I/O last 3 completed shifts: In: 340 [P.O.:340] Out: -   Intake/Output this shift:  No intake/output data recorded. Weight change: -1.1 kg Gen: NAD CVS: RRR Resp:CTA Abd: +BS, soft, NT/ND Ext:  trace to 1+ edema when examined in the bed   Recent Labs  Lab 12/28/23 1551 12/29/23 0414 12/29/23 1703 12/30/23 0419 12/31/23 0519 01/01/24 0733 01/02/24 0245  NA 134* 135 135 134* 135 135 133*  K 3.8 3.5 4.2 4.6 5.0 4.4 5.0  CL 100 99 98 99 98 92* 94*  CO2 26 25 26 27 24 27 25   GLUCOSE 99 104* 131* 101* 100* 108* 111*  BUN 18 16 25* 33* 51* 22 38*  CREATININE 0.98 0.97 1.37* 2.11* 3.67* 2.75* 4.20*  ALBUMIN 2.5* 2.3* 2.5* 2.5* 2.5* 2.8* 2.8*  CALCIUM 8.2* 8.0* 8.4* 8.5* 8.8* 9.3 9.2  PHOS 1.7* 4.2 3.2 4.5 6.8* 5.7* 7.2*   Liver Function Tests: Recent Labs  Lab 12/31/23 0519 01/01/24 0733 01/02/24 0245  ALBUMIN 2.5* 2.8* 2.8*   No results for input(s): "LIPASE", "AMYLASE" in the last 168 hours. No results for input(s): "AMMONIA" in the last 168 hours. CBC: Recent Labs  Lab 12/29/23 0414 12/30/23 0419 12/31/23 0519 01/01/24 0733 01/02/24 0245  WBC 7.5 8.0 7.6 7.4 8.2  HGB 8.5* 8.0* 8.5* 9.6* 9.4*  HCT 27.8* 26.6* 27.3* 30.6* 30.0*  MCV 103.0* 105.1* 103.8* 102.3* 101.7*  PLT 152 157 174 155 150   Cardiac Enzymes: No results for input(s): "CKTOTAL", "CKMB", "CKMBINDEX", "TROPONINI" in the last 168 hours. CBG: Recent Labs  Lab 01/01/24 1618 01/01/24 1929 01/01/24 2305 01/02/24 0501 01/02/24 0817  GLUCAP 84 125* 121* 97 114*     Iron Studies: No results for input(s): "IRON", "TIBC", "TRANSFERRIN", "FERRITIN" in the last 72 hours. Studies/Results: No results found.  amiodarone  200 mg Oral Daily   ascorbic acid  250 mg Oral BID   Chlorhexidine Gluconate Cloth  6 each Topical Daily   darbepoetin (ARANESP) injection - NON-DIALYSIS  100 mcg Subcutaneous Q Tue-1800   feeding supplement  237 mL Oral TID BM   fiber supplement (BANATROL TF)  60 mL Per Tube BID   Gerhardt's butt cream   Topical BID   influenza vaccine adjuvanted  0.5 mL Intramuscular Tomorrow-1000   insulin aspart  0-15 Units Subcutaneous TID WC   metoCLOPramide  5 mg Oral TID AC   midodrine  20 mg Oral Q8H   mirtazapine  7.5 mg Oral QHS   multivitamin  1 tablet Oral BID   polyethylene glycol  17 g Oral Daily   QUEtiapine  25 mg Oral QHS   sertraline  25 mg Oral Daily   warfarin  2 mg Oral q1600   Warfarin - Pharmacist Dosing Inpatient   Does not apply q1600    BMET    Component Value Date/Time   NA 133 (L) 01/02/2024 0245  K 5.0 01/02/2024 0245   CL 94 (L) 01/02/2024 0245   CO2 25 01/02/2024 0245   GLUCOSE 111 (H) 01/02/2024 0245   BUN 38 (H) 01/02/2024 0245   CREATININE 4.20 (H) 01/02/2024 0245   CREATININE 1.30 (H) 04/29/2016 1144   CALCIUM 9.2 01/02/2024 0245   GFRNONAA 11 (L) 01/02/2024 0245   GFRAA 42 (L) 08/19/2020 1304   CBC    Component Value Date/Time   WBC 8.2 01/02/2024 0245   RBC 2.95 (L) 01/02/2024 0245   HGB 9.4 (L) 01/02/2024 0245   HCT 30.0 (L) 01/02/2024 0245   PLT 150 01/02/2024 0245   MCV 101.7 (H) 01/02/2024 0245   MCH 31.9 01/02/2024 0245   MCHC 31.3 01/02/2024 0245   RDW 20.5 (H) 01/02/2024 0245   LYMPHSABS 2.2 12/09/2023 1001   MONOABS 0.6 12/09/2023 1001   EOSABS 0.0 12/09/2023 1001   BASOSABS 0.2 (H) 12/09/2023 1001    Assessment/ Plan: Pt is a 73 y.o. yo female  with HTN, NICM, pulmonary hypertension, mitral and tricuspid valve disease who present w/ surgery for valvular disease c/b shock,  AHRF, and AKI    1)  Anuric AKI on CKD 3A: Multifactorial etiology including cardiorenal syndrome and ischemic ATN.  Started CRRT on 1/2 for fluid overload.  The CRRT was held on 1/12 because of worsening leukocytosis and poor access with plan to have line holiday. IHD on 1/13 with intra-dialytic hypotension and no UF.  Resumed CRRT on 1/14-1/20 (new cath on 1/14) to manage volume. CRRT 1/22- 12/29/23.  No UOP  TDC placed 12/24/23 by Dr. Juel Burrow.  CRRT stopped 1/29 morning.  Bp remains soft but she tolerated IHD 1/31 with 3 L UF with support of po midodrine 20 TID.  Plan will be to continue MWF schedule as needed-  next HD tomorrow .  Unfortunately still very min  UOP so no signs of renal recovery  2) #Multifactorial shock: Vasoplegic and cardioplegia catheter surgery.   - midodrine incr to 20mg  TID, milrinone off on 1/20, off pressors now. Cont mgmt per primary team   3) Acute exacerbation of heart failure with reduced ejection fraction: Volume overload. Optimized with CRRT.   UF as tolerated/needed with IHD    4) Severe MR and tricuspid valvular disease:  - Status post repair.  CT surgery managing   5) Postoperative cardiac tamponade:  - Continue monitoring per cardiology     6) Anemia: Related to blood loss and compounded by AKI and critical illness.    - Transfusions per primary team.   - aranesp 40 mcg weekly on Tuesdays -> incr to 100  7) Bones-  first time phos was up-  no action yet      Jennette Kettle [employer] [phone]

## 2024-01-02 NOTE — Progress Notes (Addendum)
PHARMACY - ANTICOAGULATION CONSULT NOTE  Pharmacy Consult for Warfarin Indication: atrial fibrillation  No Known Allergies  Patient Measurements: Height: 5\' 3"  (160 cm) Weight: 74.9 kg (165 lb 2 oz) IBW/kg (Calculated) : 52.4 Heparin Dosing Weight: ~ 70 kg  Vital Signs: Temp: 97.5 F (36.4 C) (02/02 0820) Temp Source: Oral (02/02 0820) BP: 116/63 (02/02 0820) Pulse Rate: 72 (02/02 0820)  Labs: Recent Labs    12/31/23 0519 01/01/24 0733 01/02/24 0245 01/02/24 0435  HGB 8.5* 9.6* 9.4*  --   HCT 27.3* 30.6* 30.0*  --   PLT 174 155 150  --   LABPROT 23.9* 25.6*  --  26.5*  INR 2.1* 2.3*  --  2.4*  HEPARINUNFRC 0.56  --   --   --   CREATININE 3.67* 2.75* 4.20*  --    Estimated Creatinine Clearance: 11.7 mL/min (A) (by C-G formula based on SCr of 4.2 mg/dL (H)).  Assessment: 73 yo female s/p scheduled bioprosthetic mitral valve replacement with tissue valve 12/30 now with afib, pharmacy asked to dose anticoagulation with IV heparin. Spontaneously converted 1/7; flipped back to AF overnight 1/9 0300. Baseline INR 1.1, slow uptrend to 1.3 s/p 6 days warfarin therapy, total dose 20 mg, then held 1/21-24 for tunneled access plans. Patient now s/p [facility] and tunneled central line placement 1/24 AM on CRRT, long-term dialysis plan pending.  CBC remains low-stable, off norepinephrine since 1/26, remains on midodrine. Resumed anticoagulation per [facility] after tunneled cath placement. Heparin stopped 1/31 with INR >2  Hgb 8-9s stable, plt 100s stable. INR is therapeutic at 2.4. Minimal oral intake - prn ensure, TF stopped 1/28. No s/sx of bleeding reported.  Goal of Therapy:  INR goal 2 - 3 (bMVR) Monitor platelets by anticoagulation protocol: Yes   Plan:  Continue warfarin 2mg  PO daily Daily CBC and INR  Nicole Kindred, PharmD PGY1 Pharmacy Resident 01/02/2024 10:24 AM

## 2024-01-02 NOTE — Progress Notes (Signed)
[facility] PROGRESS NOTE   Vanessa [name]:[id number] DOB: October 29, 1951 DOA: 11/29/2023  PCP: Milus Height, PA  Brief History: 72yoF with PMH significant for HTN, NICM, HFrEF (EF40-45%), PHTN, CKD and hx of prior severe MR s/p prior mitral clip x 2 in 2020. Pt with progressive DOE found to have increasing MR and moderate TR despite adjustments in GDMT. Not felt to be candidate for additional mitral clip. Repeat LHC without CAD but showed moderate PHTN. Underwent mitral valve replacement with tissue valve and tricuspid valve repair by Dr. Leafy Ro on 12/30.  Hospital course complicated by respiratory failure requiring intubation and ventilator support.  Also developed acute kidney injury.  Consultants: Cardiology.  Cardiothoracic surgery.  Nephrology.  Critical care medicine  Significant Hospital Events:  12/30 MVR by Dr. Leafy Ro; relook for tamponade/washout later in evening without obvious source 1/2 Intubated for respiratory decompensation, iNO started. Vanc, meropenem started. CRRT started.  1/5 Extubated 1/8 Converted to NSR overnight 1/9 Back to A.fib around 0200 1/11 R introducer out.  Unable to thread right subclavian catheter 1/12 Remains weak still pressor dependent 1/13 Repeat BC sent. Still on pressors, Zosyn changed to meropenem.  Chest abdomen and pelvis CT obtained : Moderate hemorrhagic pericardial effusion, 5.3 cm hematoma right thrombus. Arterial gas bilateral patchy opacities mild dependent posterior right upper lobe atelectasis was unremarkable layering gallstones; neg for tamponade on repeat Echo 1/14 New right IJ HD catheter placed. Echo obtained to better evaluate her EF is 35 to 40%.  The LV demonstrates regional wall motion abnormalities there is asymmetric left ventricular hypertrophy of the inferior lateral segment RV systolic function moderate regular reduced left atrial wall dilated no mention of clot.  1/15 Still on fairly high-dose norepinephrine 1/16  Cont CRRT + low dose NE  1/17 RHC confirms low output state, restarted on milrinone 1/20 CRRT stopped 1/21: D/c milrinone yesterday, ongoing increase NE requirements during nights. Started droxidopa in addition to midodrine to aid off NE.  1/22: Levo up to 18 overnight (max so far), cvp 9, restarting crrt today. Received dose methylene blue, Cortisol 21 1/23: Levo down to 9, CVP 8 1/24: Tunneled HD and tunneled dual lumen LIJ CVC placed  1/27: 1U PRBC given for Hgb 7.2 and hypotension. Off levo.  1/29: CRRT stopped. Cortrak clogged, removed. 1/31 tolerated iHD    Subjective/Interval History: Patient upset that she was moved in the middle of the night to a new room.  Denies any shortness of breath or chest pain this morning.  No abdominal pain nausea or vomiting.    Assessment/Plan:  Postcardiotomy cardiogenic and septic shock She is status post mitral and tricuspid valve replacement. She was in the ICU due to worsening clinical status.  She was intubated.  Subsequently she was extubated.  Stabilized and then transferred to the floor. Seems to be hemodynamically stable currently.  Acute kidney injury Followed by nephrology.  It appears that she has progressed to end-stage disease.  She required CRRT in the ICU.  Was dialyzed on 1/31.  Nephrology managing.  Chronic systolic CHF/pulmonary hypertension/complete heart block EF is 40 to 45%.  Followed by heart failure team.  Low blood pressure has been limiting use of GDMT.  She also has AKI.  Volume status being managed with hemodialysis.  She was on CRRT previously. Requiring midodrine. Complete heart block was transient and has resolved.  Acute respiratory failure with hypoxia Noted to be on oxygen by nasal cannula.  Try to wean as possible.  Mitral regurgitation  Status post MitraClip Perinal mitral valve replacement.  Currently on warfarin.  Ventricular ectopy On amiodarone.  Paroxysmal atrial fibrillation/atrial  flutter Stable.  Monitor on telemetry.  Noted to be on amiodarone. Also on anticoagulation with warfarin.  Macrocytic anemia No evidence of overt bleeding.  Required blood transfusion on 1/27.  Continue to monitor.  History of goiter Off of methimazole.  TSH was normal.  Outpatient follow-up with endocrinology.  Hyperglycemia HbA1c 6.  SSI.  Situational depression/sleep disturbance Continue Zoloft, Seroquel, melatonin.  Continue mirtazapine.  Deep tissue injury left buttock Wound care consulted.  Poor oral intake Encourage oral intake.  Oral intake has been poor.  She had a core track in the ICU which had to be removed since it got clogged.  Oral intake was discussed with patient this morning.  She mentions that her appetite is improving.  She would like to avoid feeding tubes going forward.   DVT Prophylaxis: On warfarin Code Status: Full code Family Communication: Discussed with patient Disposition Plan: Inpatient rehabilitation is recommended     Medications: Scheduled:  amiodarone  200 mg Oral Daily   ascorbic acid  250 mg Oral BID   Chlorhexidine Gluconate Cloth  6 each Topical Daily   darbepoetin (ARANESP) injection - NON-DIALYSIS  100 mcg Subcutaneous Q Tue-1800   feeding supplement  237 mL Oral TID BM   fiber supplement (BANATROL TF)  60 mL Per Tube BID   Gerhardt's butt cream   Topical BID   influenza vaccine adjuvanted  0.5 mL Intramuscular Tomorrow-1000   insulin aspart  0-15 Units Subcutaneous TID WC   metoCLOPramide  5 mg Oral TID AC   midodrine  20 mg Oral Q8H   mirtazapine  7.5 mg Oral QHS   multivitamin  1 tablet Oral BID   polyethylene glycol  17 g Oral Daily   QUEtiapine  25 mg Oral QHS   sertraline  25 mg Oral Daily   warfarin  2 mg Oral q1600   Warfarin - Pharmacist Dosing Inpatient   Does not apply q1600   Continuous:  sodium chloride Stopped (12/26/23 1005)   anticoagulant sodium citrate     ZOX:WRUEAV chloride, alteplase, anticoagulant  sodium citrate, artificial tears, bisacodyl **OR** bisacodyl, feeding supplement, heparin, HYDROmorphone (DILAUDID) injection, levalbuterol, lidocaine (PF), lidocaine-prilocaine, lip balm, melatonin, ondansetron (ZOFRAN) IV, mouth rinse, oxyCODONE, pentafluoroprop-tetrafluoroeth, pneumococcal 20-valent conjugate vaccine, sodium chloride, sodium chloride, traZODone  Antibiotics: Anti-infectives (From admission, onward)    Start     Dose/Rate Route Frequency Ordered Stop   12/24/23 0829  ceFAZolin (ANCEF) IVPB 2g/100 mL premix        over 30 Minutes Intravenous Continuous PRN 12/24/23 0829 12/24/23 0829   12/24/23 0800  ceFAZolin (ANCEF) IVPB 2g/100 mL premix        2 g 200 mL/hr over 30 Minutes Intravenous  Once 12/24/23 0714 12/24/23 1015   12/19/23 2200  meropenem (MERREM) 1 g in sodium chloride 0.9 % 100 mL IVPB  Status:  Discontinued        1 g 200 mL/hr over 30 Minutes Intravenous Every 12 hours 12/19/23 1407 12/19/23 1408   12/19/23 2200  meropenem (MERREM) 2 g in sodium chloride 0.9 % 100 mL IVPB        2 g 280 mL/hr over 30 Minutes Intravenous Every 12 hours 12/19/23 1408 12/26/23 0945   12/14/23 1400  meropenem (MERREM) 1 g in sodium chloride 0.9 % 100 mL IVPB  Status:  Discontinued  1 g 200 mL/hr over 30 Minutes Intravenous Every 8 hours 12/14/23 1147 12/19/23 1407   12/13/23 2200  piperacillin-tazobactam (ZOSYN) IVPB 3.375 g  Status:  Discontinued        3.375 g 12.5 mL/hr over 240 Minutes Intravenous Every 12 hours 12/13/23 0620 12/13/23 1040   12/13/23 1400  meropenem (MERREM) 1 g in sodium chloride 0.9 % 100 mL IVPB  Status:  Discontinued        1 g 200 mL/hr over 30 Minutes Intravenous Every 24 hours 12/13/23 1047 12/14/23 1147   12/11/23 2200  linezolid (ZYVOX) tablet 600 mg  Status:  Discontinued        600 mg Oral Every 12 hours 12/11/23 1151 12/11/23 1800   12/11/23 2200  linezolid (ZYVOX) tablet 600 mg        600 mg Per Tube Every 12 hours 12/11/23 1800 12/18/23  2108   12/11/23 1000  linezolid (ZYVOX) IVPB 600 mg  Status:  Discontinued        600 mg 300 mL/hr over 60 Minutes Intravenous Every 12 hours 12/11/23 0725 12/11/23 1151   12/11/23 1000  linezolid (ZYVOX) IVPB 600 mg  Status:  Discontinued        600 mg 300 mL/hr over 60 Minutes Intravenous Every 12 hours 12/11/23 0725 12/11/23 0725   12/11/23 0815  piperacillin-tazobactam (ZOSYN) IVPB 3.375 g  Status:  Discontinued        3.375 g 12.5 mL/hr over 240 Minutes Intravenous Every 8 hours 12/11/23 0725 12/13/23 0620   12/04/23 0500  vancomycin (VANCOCIN) IVPB 1000 mg/200 mL premix        1,000 mg 200 mL/hr over 60 Minutes Intravenous Every 24 hours 12/03/23 0434 12/09/23 0513   12/03/23 0600  meropenem (MERREM) 1 g in sodium chloride 0.9 % 100 mL IVPB        1 g 200 mL/hr over 30 Minutes Intravenous Every 8 hours 12/03/23 0434 12/09/23 2243   12/03/23 0530  vancomycin (VANCOREADY) IVPB 2000 mg/400 mL        2,000 mg 200 mL/hr over 120 Minutes Intravenous  Once 12/03/23 0434 12/03/23 0723   11/29/23 1945  vancomycin (VANCOCIN) IVPB 1000 mg/200 mL premix        1,000 mg 200 mL/hr over 60 Minutes Intravenous  Once 11/29/23 1232 11/29/23 2136   11/29/23 1615  vancomycin (VANCOREADY) IVPB 1250 mg/250 mL  Status:  Discontinued        1,250 mg 166.7 mL/hr over 90 Minutes Intravenous To Surgery 11/29/23 1520 11/29/23 1740   11/29/23 1615  ceFAZolin (ANCEF) IVPB 2g/100 mL premix  Status:  Discontinued        2 g 200 mL/hr over 30 Minutes Intravenous To Surgery 11/29/23 1520 11/29/23 1740   11/29/23 1615  ceFAZolin (ANCEF) IVPB 2g/100 mL premix  Status:  Discontinued        2 g 200 mL/hr over 30 Minutes Intravenous To Surgery 11/29/23 1520 11/29/23 1740   11/29/23 1615  vancomycin (VANCOCIN) 1,000 mg in sodium chloride 0.9 % 1,000 mL irrigation  Status:  Discontinued         Irrigation To Surgery 11/29/23 1520 11/29/23 1740   11/29/23 1345  ceFAZolin (ANCEF) IVPB 2g/100 mL premix        2 g 200  mL/hr over 30 Minutes Intravenous Every 8 hours 11/29/23 1232 12/01/23 0631   11/29/23 0848  vancomycin (VANCOCIN) 1,000 mg in sodium chloride 0.9 % 1,000 mL irrigation  Status:  Discontinued  As needed 11/29/23 0848 11/29/23 1229   11/29/23 0400  Vancomycin (VANCOCIN) 1,500 mg in sodium chloride 0.9 % 500 mL IVPB        1,500 mg 250 mL/hr over 120 Minutes Intravenous To Surgery 11/26/23 1358 11/29/23 1343   11/29/23 0400  ceFAZolin (ANCEF) IVPB 2g/100 mL premix        2 g 200 mL/hr over 30 Minutes Intravenous To Surgery 11/26/23 1358 11/29/23 0810   11/29/23 0400  ceFAZolin (ANCEF) IVPB 2g/100 mL premix        2 g 200 mL/hr over 30 Minutes Intravenous To Surgery 11/26/23 1358 11/29/23 1134   11/29/23 0400  vancomycin (VANCOCIN) 1,000 mg in sodium chloride 0.9 % 1,000 mL irrigation  Status:  Discontinued         Irrigation To Surgery 11/26/23 1358 11/29/23 1232       Objective:  Vital Signs  Vitals:   01/02/24 0100 01/02/24 0221 01/02/24 0502 01/02/24 0820  BP: 101/60 (!) 108/59 99/61 116/63  Pulse: 77 78 76 72  Resp: 19 18 19 20   Temp:  98.1 F (36.7 C) 97.6 F (36.4 C) (!) 97.5 F (36.4 C)  TempSrc:  Oral Oral Oral  SpO2: 97% 93% 95% 94%  Weight:   74.9 kg   Height:       No intake or output data in the 24 hours ending 01/02/24 1052 Filed Weights   12/31/23 1820 01/01/24 0700 01/02/24 0502  Weight: 73 kg 68.6 kg 74.9 kg    General appearance: Awake alert.  In no distress Resp: Air entry at the bases without any wheezing or rhonchi.  Few crackles. Cardio: S1-S2 is normal regular.  No S3-S4.  No rubs murmurs or bruit GI: Abdomen is soft.  Nontender nondistended.  Bowel sounds are present normal.  No masses organomegaly Extremities: No edema.  Physical deconditioning is noted. Neurologic: Alert and oriented x3.  No focal neurological deficits.    Lab Results:  Data Reviewed: I have personally reviewed following labs and reports of the imaging  studies  CBC: Recent Labs  Lab 12/29/23 0414 12/30/23 0419 12/31/23 0519 01/01/24 0733 01/02/24 0245  WBC 7.5 8.0 7.6 7.4 8.2  HGB 8.5* 8.0* 8.5* 9.6* 9.4*  HCT 27.8* 26.6* 27.3* 30.6* 30.0*  MCV 103.0* 105.1* 103.8* 102.3* 101.7*  PLT 152 157 174 155 150    Basic Metabolic Panel: Recent Labs  Lab 12/29/23 0414 12/29/23 1703 12/30/23 0419 12/31/23 0519 01/01/24 0733 01/02/24 0245  NA 135 135 134* 135 135 133*  K 3.5 4.2 4.6 5.0 4.4 5.0  CL 99 98 99 98 92* 94*  CO2 25 26 27 24 27 25   GLUCOSE 104* 131* 101* 100* 108* 111*  BUN 16 25* 33* 51* 22 38*  CREATININE 0.97 1.37* 2.11* 3.67* 2.75* 4.20*  CALCIUM 8.0* 8.4* 8.5* 8.8* 9.3 9.2  MG 2.6*  --  2.9* 3.0* 2.5* 2.7*  PHOS 4.2 3.2 4.5 6.8* 5.7* 7.2*    GFR: Estimated Creatinine Clearance: 11.7 mL/min (A) (by C-G formula based on SCr of 4.2 mg/dL (H)).  Liver Function Tests: Recent Labs  Lab 12/29/23 1703 12/30/23 0419 12/31/23 0519 01/01/24 0733 01/02/24 0245  ALBUMIN 2.5* 2.5* 2.5* 2.8* 2.8*    Coagulation Profile: Recent Labs  Lab 12/29/23 0414 12/30/23 0419 12/31/23 0519 01/01/24 0733 01/02/24 0435  INR 1.5* 1.7* 2.1* 2.3* 2.4*    CBG: Recent Labs  Lab 01/01/24 1618 01/01/24 1929 01/01/24 2305 01/02/24 0501 01/02/24 0817  GLUCAP 84  125* 121* 97 114*     Radiology Studies: No results found.     LOS: 34 days   [place] [employer] on [email or web address]  01/02/2024, 10:52 AM

## 2024-01-02 NOTE — Progress Notes (Signed)
Inpatient Rehab Admissions Coordinator:    CIR following, await beginning of CLIP process/determination of need for outpatient HD.   Megan Salon, MS, CCC-SLP Rehab Admissions Coordinator  [phone] (celll) [phone] (office)

## 2024-01-03 DIAGNOSIS — D649 Anemia, unspecified: Secondary | ICD-10-CM | POA: Diagnosis not present

## 2024-01-03 DIAGNOSIS — E875 Hyperkalemia: Secondary | ICD-10-CM | POA: Diagnosis not present

## 2024-01-03 DIAGNOSIS — N179 Acute kidney failure, unspecified: Secondary | ICD-10-CM | POA: Diagnosis not present

## 2024-01-03 DIAGNOSIS — Z952 Presence of prosthetic heart valve: Secondary | ICD-10-CM | POA: Diagnosis not present

## 2024-01-03 LAB — RENAL FUNCTION PANEL
Albumin: 2.7 g/dL — ABNORMAL LOW (ref 3.5–5.0)
Anion gap: 14 (ref 5–15)
BUN: 55 mg/dL — ABNORMAL HIGH (ref 8–23)
CO2: 24 mmol/L (ref 22–32)
Calcium: 9 mg/dL (ref 8.9–10.3)
Chloride: 95 mmol/L — ABNORMAL LOW (ref 98–111)
Creatinine, Ser: 5.67 mg/dL — ABNORMAL HIGH (ref 0.44–1.00)
GFR, Estimated: 7 mL/min — ABNORMAL LOW (ref >60)
Glucose, Bld: 103 mg/dL — ABNORMAL HIGH (ref 70–99)
Phosphorus: 9.7 mg/dL — ABNORMAL HIGH (ref 2.5–4.6)
Potassium: 5.5 mmol/L — ABNORMAL HIGH (ref 3.5–5.1)
Sodium: 133 mmol/L — ABNORMAL LOW (ref 135–145)

## 2024-01-03 LAB — PROTIME-INR
INR: 2.5 — ABNORMAL HIGH (ref 0.8–1.2)
Prothrombin Time: 27.6 s — ABNORMAL HIGH (ref 11.4–15.2)

## 2024-01-03 LAB — CBC
HCT: 29.2 % — ABNORMAL LOW (ref 36.0–46.0)
Hemoglobin: 9.3 g/dL — ABNORMAL LOW (ref 12.0–15.0)
MCH: 32.1 pg (ref 26.0–34.0)
MCHC: 31.8 g/dL (ref 30.0–36.0)
MCV: 100.7 fL — ABNORMAL HIGH (ref 80.0–100.0)
Platelets: 158 10*3/uL (ref 150–400)
RBC: 2.9 MIL/uL — ABNORMAL LOW (ref 3.87–5.11)
RDW: 19.5 % — ABNORMAL HIGH (ref 11.5–15.5)
WBC: 7.9 10*3/uL (ref 4.0–10.5)
nRBC: 0 % (ref 0.0–0.2)

## 2024-01-03 LAB — MAGNESIUM: Magnesium: 2.9 mg/dL — ABNORMAL HIGH (ref 1.7–2.4)

## 2024-01-03 LAB — GLUCOSE, CAPILLARY
Glucose-Capillary: 107 mg/dL — ABNORMAL HIGH (ref 70–99)
Glucose-Capillary: 85 mg/dL (ref 70–99)
Glucose-Capillary: 133 mg/dL — ABNORMAL HIGH (ref 70–99)

## 2024-01-03 MED ORDER — HEPARIN SODIUM (PORCINE) 1000 UNIT/ML IJ SOLN
3200.0000 [IU] | INTRAMUSCULAR | Status: DC | PRN
Start: 2024-01-03 — End: 2024-01-12
  Administered 2024-01-03 – 2024-01-12 (×5): 3200 [IU] via INTRAVENOUS
  Filled 2024-01-03 (×2): qty 3.2
  Filled 2024-01-03: qty 4
  Filled 2024-01-03 (×2): qty 3.2
  Filled 2024-01-03: qty 4
  Filled 2024-01-03: qty 3.2
  Filled 2024-01-03 (×3): qty 4

## 2024-01-03 NOTE — Progress Notes (Signed)
   01/03/24 1300  Vitals  Temp 97.7 F (36.5 C)  Temp Source Oral  BP 101/68  MAP (mmHg) 78  BP Location Right Arm  BP Method Automatic  Patient Position (if appropriate) Lying  Pulse Rate 81  Pulse Rate Source Monitor  ECG Heart Rate 82  Resp (!) 31  Oxygen Therapy  SpO2 100 %  During Treatment Monitoring  Blood Flow Rate (mL/min) 399 mL/min  Arterial Pressure (mmHg) -206.66 mmHg  Venous Pressure (mmHg) 156.96 mmHg  TMP (mmHg) 2.83 mmHg  Ultrafiltration Rate (mL/min) 667 mL/min  Dialysate Flow Rate (mL/min) 300 ml/min  Duration of HD Treatment -hour(s) 3.59 hour(s)  Cumulative Fluid Removed (mL) per Treatment  1893.27  Post Treatment  Dialyzer Clearance Lightly streaked  Hemodialysis Intake (mL) 0 mL  Liters Processed 90  Fluid Removed (mL) 2000 mL  Tolerated HD Treatment Yes  Post-Hemodialysis Comments tolerated well tx completed  Note  Patient Observations hd completed tolerated well  Hemodialysis Catheter Right Internal jugular Double lumen Permanent (Tunneled)  Placement Date/Time: 12/24/23 0921   Serial / Lot #: [id number]  Expiration Date: 02/28/28  Time Out: Correct patient;Correct site;Correct procedure  Maximum sterile barrier precautions: Hand hygiene;Cap;Mask;Sterile gown;Sterile gloves;Large sterile s...  Site Condition No complications  Blue Lumen Status Blood return noted;Flushed;Heparin locked  Red Lumen Status Blood return noted;Flushed;Heparin locked  Purple Lumen Status N/A  Catheter fill solution Heparin 1000 units/ml  Catheter fill volume (Arterial) 1.6 cc  Catheter fill volume (Venous) 1.6  Dressing Type Transparent  Dressing Status Antimicrobial disc/dressing in place  Drainage Description None  Post treatment catheter status Capped and Clamped   Received patient in bed to unit.  Alert and oriented.  Informed consent signed and in chart.   TX duration:3:45  Patient tolerated well.  Transported back to the room  Alert, without acute  distress.  Hand-off given to patient's nurse.   Access used: rt hd catheter Access issues: none  Total UF removed: 2000 Medication(s) given: none Post HD VS: see above Post HD weight: n/a   [facility]

## 2024-01-03 NOTE — Progress Notes (Signed)
 Nutrition Follow-up  DOCUMENTATION CODES:   Not applicable  INTERVENTION:   Continue Regular po diet; room service with assist, provided menu to pt. Pt reports staff has been helping her place all her meal orders.   Continue Magic cup TID with meals, each supplement provides 290 kcal and 9 grams of protein  Continue Renal MVI, Vit C, Zinc   If remains constipated, consider increasing bowel regimen  D/C scheduled Ensure, continue prn Ensure order  D/C Banatrol  NUTRITION DIAGNOSIS:   Inadequate oral intake related to acute illness as evidenced by NPO status.  Progressing  GOAL:   Patient will meet greater than or equal to 90% of their needs  Improving  MONITOR:   PO intake, Supplement acceptance, TF tolerance, Labs, Weight trends  REASON FOR ASSESSMENT:   Consult Enteral/tube feeding initiation and management, Assessment of nutrition requirement/status (CRRT)  ASSESSMENT:   73 yo admitted with severe MR/TR requiring MVR/TVR complicated by post op bleeding and shock. Pt intubated and CRRT initiated. PMH includes HTN, NICM, HFrEF (EF40-50%), HTN, CKD and hx of prior severe MR s/p mitral clip x 2 in 2020.  12/30 Admitted, severe MR, moderate TR, OR for MVR with mosaic porcine tissue prosthesis, TVR, post op cardiogenic shock, post op bleeding requiring return to OR for washout 12/31 Extubated 01/02 Re-Intubated, iNO started, CRRT initiated 01/03 Cortrak, trickle TF 01/09 US  abdomen with dilated GB, no stones/sludge 01/05 Extubated 01/12 CRRT discontinued, diet advanced to 2g sodium 01/14 CRRT resumed 01/17 Transitioned to nocturnal TF to see if po improves, Calorie Count. RHC with low cardiac output, restarted on milrinone  01/20 CRRT stopped, Calorie Count revealed pt eating minimally (7% calorie needs, 2% protein needs), not taking supplements 01/22 Full TF resumed 01/29 Cortrak clogged, removed and not replaced 01/31 Tolerated iHD with 3L net UF  02/02  Transferred out of ICU to 4E  Pt appears in better spirits; reports tolerating dialysis ok.  Pt reports appetite is improving and per MD notes, would like to avoid feeding tube moving foward  Recorded po intake per documentation improved over the weekend. Per I/O: 2/01: 100% at Breakfast, 10% at [facility] and 10% at [facility] 2/02: 100% at Breakfast, 75% at lunch, no documentation at dinner.   No recorded po intake yesterday. Pt reports she was going to eat breakfast yesterday but then she was taken to [facility] and was not able to ea breakfast. Pt reports she got back after lunch time and ordered spaghetti which she enjoyed and ate but later felt nauseous. Because of this and eating lunch around mid afternoon, pt did not eat dinner. She stated nephrologist encouraged her to maybe eat lighter post iHD or wait a little while before eating. RD agrees and continues to encourage small frequent meals. Pt reports she ate cherrios and magic cup at breakfast this AM  Overall, appetite is improving as is po intake but remains inadequate. Pt not drinking Ensure, reports she actually prefers the [employer] and as been requesting those on meal trays. Pt likes the chocolate and berry and is ok with receiving on all trays. Plan to d/c scheduled Ensure but continue Ensure prn.   iHD yesterday, potassium and phosphorus improved with iHD  Pt previously with diarrhea, now constipated with no BM in 4 days; banatrol d/c  Labs: phosphorus 6.3 (H), magnesium  2.3 (wdl), sodium 134 (wdl), potassium 4.0 (wdl) Meds: Reglan , Rena-Vite x 2, ss novolog , remeron , noted fosrenol  ordered today   Diet Order:   Diet Order  Diet regular Room service appropriate? Yes; Fluid consistency: Thin  Diet effective now                   EDUCATION NEEDS:   Not appropriate for education at this time  Skin:  Skin Assessment: Skin Integrity Issues: Skin Integrity Issues:: DTI DTI: L Buttocks Incisions: chest post sternotomy  (closed)  Last BM:  2/01  Height:   Ht Readings from Last 1 Encounters:  12/26/23 5' 3 (1.6 m)    Weight:   Wt Readings from Last 1 Encounters:  01/04/24 71.2 kg     BMI:  Body mass index is 27.81 kg/m.  Estimated Nutritional Needs:   Kcal:  1600-1800 kcals  Protein:  85-105 g  Fluid:  1L plus UOP   Vanessa Finger MS, RDN, LDN, CNSC Registered Dietitian 3 Clinical Nutrition RD Inpatient Contact Info in Amion

## 2024-01-03 NOTE — Progress Notes (Signed)
PHARMACY - ANTICOAGULATION CONSULT NOTE  Pharmacy Consult for Warfarin Indication: atrial fibrillation  No Known Allergies  Patient Measurements: Height: 5\' 3"  (160 cm) Weight: 72.6 kg (160 lb 0.9 oz) IBW/kg (Calculated) : 52.4 Heparin Dosing Weight: ~ 70 kg  Vital Signs: Temp: 97.7 F (36.5 C) (02/03 1300) Temp Source: Oral (02/03 1300) BP: 101/68 (02/03 1300) Pulse Rate: 81 (02/03 1300)  Labs: Recent Labs    01/01/24 0733 01/02/24 0245 01/02/24 0435 01/03/24 0500  HGB 9.6* 9.4*  --  9.3*  HCT 30.6* 30.0*  --  29.2*  PLT 155 150  --  158  LABPROT 25.6*  --  26.5* 27.6*  INR 2.3*  --  2.4* 2.5*  CREATININE 2.75* 4.20*  --  5.67*   Estimated Creatinine Clearance: 8.6 mL/min (A) (by C-G formula based on SCr of 5.67 mg/dL (H)).  Assessment: 73 yo female s/p scheduled bioprosthetic mitral valve replacement with tissue valve 12/30 now with afib, pharmacy asked to dose anticoagulation with IV heparin. Spontaneously converted 1/7; flipped back to AF overnight 1/9 0300. Baseline INR 1.1, slow uptrend to 1.3 s/p 6 days warfarin therapy, total dose 20 mg, then held 1/21-24 for tunneled access plans. Patient now s/p [facility] and tunneled central line placement 1/24 AM on CRRT, long-term dialysis plan pending.  CBC remains low-stable, off norepinephrine since 1/26, remains on midodrine. Resumed anticoagulation per VIR after tunneled cath placement. Heparin stopped 1/31 with INR >2  Hgb 8-9s stable, plt 100s stable. INR is therapeutic at 2.5. Diminished oral intake - prn ensure, TF stopped 1/28. No s/sx of bleeding reported.  Goal of Therapy:  INR goal 2 - 3 (bMVR) Monitor platelets by anticoagulation protocol: Yes   Plan:  Continue with warfarin 2mg  PO x 1 tonight Daily CBC and INR  Jeanella Cara, PharmD, [facility] Clinical Pharmacist Please see AMION for all Pharmacists' Contact Phone Numbers 01/03/2024, 1:51 PM

## 2024-01-03 NOTE — Progress Notes (Signed)
[facility] PROGRESS NOTE   Vanessa [name] ZOX:[id number] DOB: 1951/02/23 DOA: 11/29/2023  PCP: Milus Height, PA  Brief History: 72yoF with PMH significant for HTN, NICM, HFrEF (EF40-45%), PHTN, CKD and hx of prior severe MR s/p prior mitral clip x 2 in 2020. Pt with progressive DOE found to have increasing MR and moderate TR despite adjustments in GDMT. Not felt to be candidate for additional mitral clip. Repeat LHC without CAD but showed moderate PHTN. Underwent mitral valve replacement with tissue valve and tricuspid valve repair by Dr. Leafy Ro on 12/30.  Hospital course complicated by respiratory failure requiring intubation and ventilator support.  Also developed acute kidney injury.  Consultants: Cardiology.  Cardiothoracic surgery.  Nephrology.  Critical care medicine  Significant Hospital Events:  12/30 MVR by Dr. Leafy Ro; relook for tamponade/washout later in evening without obvious source 1/2 Intubated for respiratory decompensation, iNO started. Vanc, meropenem started. CRRT started.  1/5 Extubated 1/8 Converted to NSR overnight 1/9 Back to A.fib around 0200 1/11 R introducer out.  Unable to thread right subclavian catheter 1/12 Remains weak still pressor dependent 1/13 Repeat BC sent. Still on pressors, Zosyn changed to meropenem.  Chest abdomen and pelvis CT obtained : Moderate hemorrhagic pericardial effusion, 5.3 cm hematoma right thrombus. Arterial gas bilateral patchy opacities mild dependent posterior right upper lobe atelectasis was unremarkable layering gallstones; neg for tamponade on repeat Echo 1/14 New right IJ HD catheter placed. Echo obtained to better evaluate her EF is 35 to 40%.  The LV demonstrates regional wall motion abnormalities there is asymmetric left ventricular hypertrophy of the inferior lateral segment RV systolic function moderate regular reduced left atrial wall dilated no mention of clot.  1/15 Still on fairly high-dose norepinephrine 1/16  Cont CRRT + low dose NE  1/17 RHC confirms low output state, restarted on milrinone 1/20 CRRT stopped 1/21: D/c milrinone yesterday, ongoing increase NE requirements during nights. Started droxidopa in addition to midodrine to aid off NE.  1/22: Levo up to 18 overnight (max so far), cvp 9, restarting crrt today. Received dose methylene blue, Cortisol 21 1/23: Levo down to 9, CVP 8 1/24: Tunneled HD and tunneled dual lumen LIJ CVC placed  1/27: 1U PRBC given for Hgb 7.2 and hypotension. Off levo.  1/29: CRRT stopped. Cortrak clogged, removed. 1/31 tolerated iHD    Subjective/Interval History: Patient slept well overnight.  Denies any abdominal pain nausea or vomiting.  No shortness of breath.  Still with no urine production.  Appetite is slowly getting better.     Assessment/Plan:  Postcardiotomy cardiogenic and septic shock She is status post mitral and tricuspid valve replacement. She was in the ICU due to worsening clinical status.  She was intubated.  Subsequently she was extubated.  Stabilized and then transferred to the floor. Seems to be hemodynamically stable currently.  Acute kidney injury/hyperkalemia Followed by nephrology.  It appears that she has progressed to end-stage disease.  She required CRRT in the ICU.  Was dialyzed on 1/31.  Plan is to do dialysis again today.  Nephrology managing. Potassium of 5.5 noted.  Should improve with dialysis.  Chronic systolic CHF/pulmonary hypertension/complete heart block EF is 40 to 45%.  Followed by heart failure team.  Low blood pressure has been limiting use of GDMT.  She also has AKI.  Volume status being managed with hemodialysis.  She was on CRRT previously. Requiring midodrine. Complete heart block was transient and has resolved.  Acute respiratory failure with hypoxia Noted to be on  oxygen by nasal cannula.  Try to wean as possible.  Mitral regurgitation Status post MitraClip Perinal mitral valve replacement.  Currently on  warfarin.  Ventricular ectopy On amiodarone.  Paroxysmal atrial fibrillation/atrial flutter Stable.  Monitor on telemetry.  Noted to be on amiodarone. Anticoagulated with warfarin.  Macrocytic anemia Required blood transfusion on 1/27.  Hemoglobin has been stable.  No evidence for overt bleeding.  History of goiter Off of methimazole.  TSH was normal.  Outpatient follow-up with endocrinology.  Hyperglycemia HbA1c 6.  SSI.  Situational depression/sleep disturbance Continue Zoloft, Seroquel, melatonin.  Continue mirtazapine.  Deep tissue injury left buttock Wound care consulted.  Poor oral intake She had a core track in the ICU which had to be removed since it got clogged. Oral intake has improved.  She had 100% of her breakfast yesterday and 75% of her lunch.  Continue to encourage oral intake.  She wants to avoid feeding tubes.    DVT Prophylaxis: On warfarin Code Status: Full code Family Communication: Discussed with patient Disposition Plan: Inpatient rehabilitation is recommended     Medications: Scheduled:  amiodarone  200 mg Oral Daily   ascorbic acid  250 mg Oral BID   Chlorhexidine Gluconate Cloth  6 each Topical Daily   Chlorhexidine Gluconate Cloth  6 each Topical Q0600   darbepoetin (ARANESP) injection - NON-DIALYSIS  100 mcg Subcutaneous Q Tue-1800   feeding supplement  237 mL Oral TID BM   Gerhardt's butt cream   Topical BID   influenza vaccine adjuvanted  0.5 mL Intramuscular Tomorrow-1000   insulin aspart  0-15 Units Subcutaneous TID WC   metoCLOPramide  5 mg Oral TID AC   midodrine  20 mg Oral Q8H   mirtazapine  7.5 mg Oral QHS   multivitamin  1 tablet Oral BID   polyethylene glycol  17 g Oral Daily   QUEtiapine  25 mg Oral QHS   sertraline  25 mg Oral Daily   warfarin  2 mg Oral q1600   Warfarin - Pharmacist Dosing Inpatient   Does not apply q1600   Continuous:  sodium chloride Stopped (12/26/23 1005)   anticoagulant sodium citrate      WUJ:WJXBJY chloride, alteplase, anticoagulant sodium citrate, artificial tears, bisacodyl **OR** bisacodyl, feeding supplement, heparin, HYDROmorphone (DILAUDID) injection, levalbuterol, lidocaine (PF), lidocaine-prilocaine, lip balm, melatonin, ondansetron (ZOFRAN) IV, mouth rinse, oxyCODONE, pentafluoroprop-tetrafluoroeth, pneumococcal 20-valent conjugate vaccine, sodium chloride, sodium chloride, traZODone   Objective:  Vital Signs  Vitals:   01/02/24 1924 01/03/24 0008 01/03/24 0421 01/03/24 0500  BP: (!) 107/59 120/70 128/72   Pulse: 84 82 75   Resp: (!) 23 18 15    Temp: 98.5 F (36.9 C) 97.8 F (36.6 C) 98 F (36.7 C)   TempSrc: Oral Oral Oral   SpO2: 98% 96% 97%   Weight:    72.6 kg  Height:        Intake/Output Summary (Last 24 hours) at 01/03/2024 0922 Last data filed at 01/02/2024 1300 Gross per 24 hour  Intake 240 ml  Output --  Net 240 ml   Filed Weights   01/01/24 0700 01/02/24 0502 01/03/24 0500  Weight: 68.6 kg 74.9 kg 72.6 kg    General appearance: Awake alert.  In no distress Resp: Clear to auscultation bilaterally.  Normal effort Cardio: S1-S2 is normal regular.  No S3-S4.  No rubs murmurs or bruit GI: Abdomen is soft.  Nontender nondistended.  Bowel sounds are present normal.  No masses organomegaly Extremities: No edema.  Physical  deconditioning is noted. No obvious focal neurological deficits.  Lab Results:  Data Reviewed: I have personally reviewed following labs and reports of the imaging studies  CBC: Recent Labs  Lab 12/30/23 0419 12/31/23 0519 01/01/24 0733 01/02/24 0245 01/03/24 0500  WBC 8.0 7.6 7.4 8.2 7.9  HGB 8.0* 8.5* 9.6* 9.4* 9.3*  HCT 26.6* 27.3* 30.6* 30.0* 29.2*  MCV 105.1* 103.8* 102.3* 101.7* 100.7*  PLT 157 174 155 150 158    Basic Metabolic Panel: Recent Labs  Lab 12/30/23 0419 12/31/23 0519 01/01/24 0733 01/02/24 0245 01/03/24 0500  NA 134* 135 135 133* 133*  K 4.6 5.0 4.4 5.0 5.5*  CL 99 98 92* 94* 95*   CO2 27 24 27 25 24   GLUCOSE 101* 100* 108* 111* 103*  BUN 33* 51* 22 38* 55*  CREATININE 2.11* 3.67* 2.75* 4.20* 5.67*  CALCIUM 8.5* 8.8* 9.3 9.2 9.0  MG 2.9* 3.0* 2.5* 2.7* 2.9*  PHOS 4.5 6.8* 5.7* 7.2* 9.7*    GFR: Estimated Creatinine Clearance: 8.6 mL/min (A) (by C-G formula based on SCr of 5.67 mg/dL (H)).  Liver Function Tests: Recent Labs  Lab 12/30/23 0419 12/31/23 0519 01/01/24 0733 01/02/24 0245 01/03/24 0500  ALBUMIN 2.5* 2.5* 2.8* 2.8* 2.7*    Coagulation Profile: Recent Labs  Lab 12/30/23 0419 12/31/23 0519 01/01/24 0733 01/02/24 0435 01/03/24 0500  INR 1.7* 2.1* 2.3* 2.4* 2.5*    CBG: Recent Labs  Lab 01/02/24 0817 01/02/24 1138 01/02/24 1655 01/02/24 2159 01/03/24 0622  GLUCAP 114* 128* 90 120* 107*     Radiology Studies: No results found.     LOS: 35 days   Muscab Brenneman [employer] on [email or web address]  01/03/2024, 9:22 AM

## 2024-01-03 NOTE — Progress Notes (Signed)
PT Cancellation Note  Patient Details Name: Vanessa [name] MRN: [id number] DOB: 11-22-1951   Cancelled Treatment:    Reason Eval/Treat Not Completed: Patient at procedure or test/unavailable. Pt attempted at 10:30a and 13:30 however pt in HD. Acute PT to return as able to progress mobility.  Lewis Shock, PT, DPT Acute Rehabilitation Services Secure chat preferred Office #: [phone]    Iona Hansen 01/03/2024, 2:00 PM

## 2024-01-03 NOTE — Procedures (Signed)
Patient seen on Hemodialysis. BP 128/72 (BP Location: Left Arm)   Pulse 75   Temp 98 F (36.7 C) (Oral)   Resp 15   Ht 5\' 3"  (1.6 m)   Wt 72.6 kg   SpO2 97%   BMI 28.35 kg/m   QB 400, UF goal 2.5L Tolerating treatment without complaints at this time.   Zetta Bills MD [facility]. Office # [phone] Pager # [phone] 9:34 AM

## 2024-01-03 NOTE — Progress Notes (Signed)
Mobility Specialist Progress Note:    01/03/24 1626  Mobility  Activity Transferred to/from [facility];Transferred from chair to bed  Level of Assistance Moderate assist, patient does 50-74%  Assistive Device BSC (+2)  Distance Ambulated (ft) 4 ft  Activity Response Tolerated well  Mobility visit 1 Mobility  Mobility Specialist Start Time (ACUTE ONLY) 1615  Mobility Specialist Stop Time (ACUTE ONLY) 1620  Mobility Specialist Time Calculation (min) (ACUTE ONLY) 5 min   Pt received in chair requesting assistance to transfer to [facility]. ModA+2 required to stand and pivot. Void unsuccessful . Pt c/o feeling sweaty and nauseous. Transferred  BSC>B. Unable to keep back straight, as time increased pt's head lowered further, making ambulation more difficult. Max HR 122 bpm during transfer from BSC>B. Lying comfortably with Student RN in room, left with all needs met, call bell in reach, alarm on.   Feliciana Rossetti Mobility Specialist Please contact via Special educational needs teacher or  Rehab office at [phone]

## 2024-01-03 NOTE — Plan of Care (Signed)
Problem: Education: Goal: Knowledge of General Education information will improve Description: Including pain rating scale, medication(s)/side effects and non-pharmacologic comfort measures Outcome: Progressing   Problem: Health Behavior/Discharge Planning: Goal: Ability to manage health-related needs will improve Outcome: Progressing   Problem: Clinical Measurements: Goal: Ability to maintain clinical measurements within normal limits will improve Outcome: Progressing Goal: Will remain free from infection Outcome: Progressing Goal: Diagnostic test results will improve Outcome: Progressing Goal: Respiratory complications will improve Outcome: Progressing Goal: Cardiovascular complication will be avoided Outcome: Progressing   Problem: Activity: Goal: Risk for activity intolerance will decrease Outcome: Progressing   Problem: Nutrition: Goal: Adequate nutrition will be maintained Outcome: Progressing   Problem: Coping: Goal: Level of anxiety will decrease Outcome: Progressing   Problem: Elimination: Goal: Will not experience complications related to bowel motility Outcome: Progressing   Problem: Pain Management: Goal: General experience of comfort will improve Outcome: Progressing   Problem: Safety: Goal: Ability to remain free from injury will improve Outcome: Progressing   Problem: Skin Integrity: Goal: Risk for impaired skin integrity will decrease Outcome: Progressing   Problem: Education: Goal: Will demonstrate proper wound care and an understanding of methods to prevent future damage Outcome: Progressing Goal: Knowledge of disease or condition will improve Outcome: Progressing Goal: Knowledge of the prescribed therapeutic regimen will improve Outcome: Progressing Goal: Individualized Educational Video(s) Outcome: Progressing   Problem: Activity: Goal: Risk for activity intolerance will decrease Outcome: Progressing   Problem: Cardiac: Goal: Will  achieve and/or maintain hemodynamic stability Outcome: Progressing   Problem: Clinical Measurements: Goal: Postoperative complications will be avoided or minimized Outcome: Progressing   Problem: Respiratory: Goal: Respiratory status will improve Outcome: Progressing   Problem: Skin Integrity: Goal: Wound healing without signs and symptoms of infection Outcome: Progressing Goal: Risk for impaired skin integrity will decrease Outcome: Progressing   Problem: Education: Goal: Will demonstrate proper wound care and an understanding of methods to prevent future damage Outcome: Progressing Goal: Knowledge of disease or condition will improve Outcome: Progressing Goal: Knowledge of the prescribed therapeutic regimen will improve Outcome: Progressing Goal: Individualized Educational Video(s) Outcome: Progressing   Problem: Activity: Goal: Risk for activity intolerance will decrease Outcome: Progressing   Problem: Cardiac: Goal: Will achieve and/or maintain hemodynamic stability Outcome: Progressing   Problem: Clinical Measurements: Goal: Postoperative complications will be avoided or minimized Outcome: Progressing   Problem: Respiratory: Goal: Respiratory status will improve Outcome: Progressing   Problem: Skin Integrity: Goal: Wound healing without signs and symptoms of infection Outcome: Progressing Goal: Risk for impaired skin integrity will decrease Outcome: Progressing   Problem: Urinary Elimination: Goal: Ability to achieve and maintain adequate renal perfusion and functioning will improve Outcome: Progressing   Problem: Education: Goal: Understanding of CV disease, CV risk reduction, and recovery process will improve Outcome: Progressing Goal: Individualized Educational Video(s) Outcome: Progressing   Problem: Activity: Goal: Ability to return to baseline activity level will improve Outcome: Progressing   Problem: Cardiovascular: Goal: Ability to achieve  and maintain adequate cardiovascular perfusion will improve Outcome: Progressing Goal: Vascular access site(s) Level 0-1 will be maintained Outcome: Progressing   Problem: Health Behavior/Discharge Planning: Goal: Ability to safely manage health-related needs after discharge will improve Outcome: Progressing   Problem: Education: Goal: Ability to describe self-care measures that may prevent or decrease complications (Diabetes Survival Skills Education) will improve Outcome: Progressing Goal: Individualized Educational Video(s) Outcome: Progressing   Problem: Coping: Goal: Ability to adjust to condition or change in health will improve Outcome: Progressing   Problem: Fluid Volume: Goal:  Ability to maintain a balanced intake and output will improve Outcome: Progressing

## 2024-01-04 DIAGNOSIS — N179 Acute kidney failure, unspecified: Secondary | ICD-10-CM | POA: Diagnosis not present

## 2024-01-04 DIAGNOSIS — E875 Hyperkalemia: Secondary | ICD-10-CM | POA: Diagnosis not present

## 2024-01-04 DIAGNOSIS — D649 Anemia, unspecified: Secondary | ICD-10-CM | POA: Diagnosis not present

## 2024-01-04 DIAGNOSIS — Z952 Presence of prosthetic heart valve: Secondary | ICD-10-CM | POA: Diagnosis not present

## 2024-01-04 LAB — CBC
HCT: 31.7 % — ABNORMAL LOW (ref 36.0–46.0)
Hemoglobin: 9.8 g/dL — ABNORMAL LOW (ref 12.0–15.0)
MCH: 31.2 pg (ref 26.0–34.0)
MCHC: 30.9 g/dL (ref 30.0–36.0)
MCV: 101 fL — ABNORMAL HIGH (ref 80.0–100.0)
Platelets: 137 10*3/uL — ABNORMAL LOW (ref 150–400)
RBC: 3.14 MIL/uL — ABNORMAL LOW (ref 3.87–5.11)
RDW: 19.1 % — ABNORMAL HIGH (ref 11.5–15.5)
WBC: 6.9 10*3/uL (ref 4.0–10.5)
nRBC: 0 % (ref 0.0–0.2)

## 2024-01-04 LAB — RENAL FUNCTION PANEL
Albumin: 2.7 g/dL — ABNORMAL LOW (ref 3.5–5.0)
Anion gap: 13 (ref 5–15)
BUN: 26 mg/dL — ABNORMAL HIGH (ref 8–23)
CO2: 27 mmol/L (ref 22–32)
Calcium: 8.8 mg/dL — ABNORMAL LOW (ref 8.9–10.3)
Chloride: 94 mmol/L — ABNORMAL LOW (ref 98–111)
Creatinine, Ser: 3.54 mg/dL — ABNORMAL HIGH (ref 0.44–1.00)
GFR, Estimated: 13 mL/min — ABNORMAL LOW (ref >60)
Glucose, Bld: 96 mg/dL (ref 70–99)
Phosphorus: 6.3 mg/dL — ABNORMAL HIGH (ref 2.5–4.6)
Potassium: 4 mmol/L (ref 3.5–5.1)
Sodium: 134 mmol/L — ABNORMAL LOW (ref 135–145)

## 2024-01-04 LAB — MAGNESIUM: Magnesium: 2.3 mg/dL (ref 1.7–2.4)

## 2024-01-04 LAB — GLUCOSE, CAPILLARY
Glucose-Capillary: 101 mg/dL — ABNORMAL HIGH (ref 70–99)
Glucose-Capillary: 168 mg/dL — ABNORMAL HIGH (ref 70–99)
Glucose-Capillary: 96 mg/dL (ref 70–99)
Glucose-Capillary: 136 mg/dL — ABNORMAL HIGH (ref 70–99)

## 2024-01-04 LAB — PROTIME-INR
INR: 2.4 — ABNORMAL HIGH (ref 0.8–1.2)
Prothrombin Time: 26.5 s — ABNORMAL HIGH (ref 11.4–15.2)

## 2024-01-04 MED ORDER — METOCLOPRAMIDE HCL 5 MG PO TABS: 5.0000 mg | ORAL_TABLET | Freq: Three times a day (TID) | ORAL | Status: DC | PRN

## 2024-01-04 MED ORDER — LANTHANUM CARBONATE 500 MG PO CHEW
500.0000 mg | CHEWABLE_TABLET | Freq: Three times a day (TID) | ORAL | Status: DC
  Administered 2024-01-04 – 2024-01-12 (×21): 500 mg via ORAL
  Filled 2024-01-04 (×27): qty 1

## 2024-01-04 MED ORDER — SENNOSIDES-DOCUSATE SODIUM 8.6-50 MG PO TABS
2.0000 | ORAL_TABLET | Freq: Two times a day (BID) | ORAL | Status: DC
Start: 2024-01-04 — End: 2024-01-12
  Administered 2024-01-04 – 2024-01-11 (×8): 2 via ORAL
  Filled 2024-01-04 (×12): qty 2

## 2024-01-04 MED ORDER — SODIUM CHLORIDE 0.9% FLUSH
10.0000 mL | Freq: Two times a day (BID) | INTRAVENOUS | Status: DC
  Administered 2024-01-04 – 2024-01-11 (×14): 10 mL

## 2024-01-04 MED ORDER — SODIUM CHLORIDE 0.9% FLUSH: 10.0000 mL | INTRAVENOUS | Status: DC | PRN

## 2024-01-04 NOTE — Progress Notes (Signed)
 [facility] PROGRESS NOTE   Vanessa [name] FMW:[id number] DOB: 1951-04-16 DOA: 11/29/2023  PCP: Alvera Reagin, PA  Brief History: 72yoF with PMH significant for HTN, NICM, HFrEF (EF40-45%), PHTN, CKD and hx of prior severe MR s/p prior mitral clip x 2 in 2020. Pt with progressive DOE found to have increasing MR and moderate TR despite adjustments in GDMT. Not felt to be candidate for additional mitral clip. Repeat LHC without CAD but showed moderate PHTN. Underwent mitral valve replacement with tissue valve and tricuspid valve repair by Dr. Maryjane on 12/30.  Hospital course complicated by respiratory failure requiring intubation and ventilator support.  Also developed acute kidney injury.  Consultants: Cardiology.  Cardiothoracic surgery.  Nephrology.  Critical care medicine  Significant Hospital Events:  12/30 MVR by Dr. Maryjane; relook for tamponade/washout later in evening without obvious source 1/2 Intubated for respiratory decompensation, iNO started. Vanc, meropenem  started. CRRT started.  1/5 Extubated 1/8 Converted to NSR  1/9 Back to A.fib around 0200 1/11 R introducer out.  Unable to thread right subclavian catheter 1/12 Remains weak still pressor dependent 1/13 Repeat BC sent. Still on pressors, Zosyn  changed to meropenem .  Chest abdomen and pelvis CT obtained : Moderate hemorrhagic pericardial effusion, 5.3 cm hematoma right thrombus. Arterial gas bilateral patchy opacities mild dependent posterior right upper lobe atelectasis was unremarkable layering gallstones; neg for tamponade on repeat Echo 1/14 New right IJ HD catheter placed. Echo obtained to better evaluate her EF is 35 to 40%.  The LV demonstrates regional wall motion abnormalities there is asymmetric left ventricular hypertrophy of the inferior lateral segment RV systolic function moderate regular reduced left atrial wall dilated no mention of clot.  1/15 Still on fairly high-dose norepinephrine  1/16 Cont  CRRT + low dose NE  1/17 RHC confirms low output state, restarted on milrinone  1/20 CRRT stopped 1/21: D/c milrinone  yesterday, ongoing increase NE requirements during nights. Started droxidopa  in addition to midodrine  to aid off NE.  1/22: Levo up to 18 overnight (max so far), cvp 9, restarting crrt today. Received dose methylene blue , Cortisol 21 1/23: Levo down to 9, CVP 8 1/24: Tunneled HD and tunneled dual lumen LIJ CVC placed  1/27: 1U PRBC given for Hgb 7.2 and hypotension. Off levo.  1/29: CRRT stopped. Cortrak clogged, removed. 1/31 tolerated iHD    Subjective/Interval History: Slept poorly overnight.  Denies any pain issues.  Appetite is improving.  No shortness of breath.     Assessment/Plan:  Postcardiotomy cardiogenic and septic shock She is status post mitral and tricuspid valve replacement. She was in the ICU due to worsening clinical status.  She was intubated.  Subsequently she was extubated.  Stabilized and then transferred to the floor. Seems to be hemodynamically stable currently.  Acute kidney injury/hyperkalemia Followed by nephrology.  It appears that she has progressed to end-stage disease.  She required CRRT in the ICU.   Nephrology is planning dialysis on a Monday Wednesday Friday schedule.   Hyperkalemia has resolved this morning after her dialysis session yesterday.    Chronic systolic CHF/pulmonary hypertension/complete heart block EF is 40 to 45%.  Followed by heart failure team.  Low blood pressure has been limiting use of GDMT.  She also has AKI.  Volume status being managed with hemodialysis.  She was on CRRT previously. Requiring midodrine . Complete heart block was transient and has resolved.  Acute respiratory failure with hypoxia Noted to be on oxygen by nasal cannula.  Try to wean as possible.  Mitral regurgitation Status post MitraClip Perinal mitral valve replacement.  Currently on warfarin.  Ventricular ectopy On  amiodarone .  Paroxysmal atrial fibrillation/atrial flutter Stable.  Monitor on telemetry.  Noted to be on amiodarone . Anticoagulated with warfarin.  Macrocytic anemia Required blood transfusion on 1/27.  Hemoglobin has been stable.  No evidence for overt bleeding.  History of goiter Off of methimazole .  TSH was normal.  Outpatient follow-up with endocrinology.  Hyperglycemia HbA1c 6.  SSI.  Situational depression/sleep disturbance Continue Zoloft , Seroquel , melatonin.  Continue mirtazapine .  Deep tissue injury left buttock Wound care consulted.  Poor oral intake She had a core track in the ICU which had to be removed since it got clogged. Oral intake has improved.  Hopefully we can avoid feeding tubes.  She is also not keen on having any feeding tubes.  DVT Prophylaxis: On warfarin Code Status: Full code Family Communication: Discussed with patient Disposition Plan: Inpatient rehabilitation is recommended     Medications: Scheduled:  amiodarone   200 mg Oral Daily   ascorbic acid   250 mg Oral BID   Chlorhexidine  Gluconate Cloth  6 each Topical Daily   Chlorhexidine  Gluconate Cloth  6 each Topical Q0600   darbepoetin (ARANESP ) injection - NON-DIALYSIS  100 mcg Subcutaneous Q Tue-1800   feeding supplement  237 mL Oral TID BM   Gerhardt's butt cream   Topical BID   influenza vaccine adjuvanted  0.5 mL Intramuscular Tomorrow-1000   insulin  aspart  0-15 Units Subcutaneous TID WC   lanthanum   500 mg Oral TID WC   metoCLOPramide   5 mg Oral TID AC   midodrine   20 mg Oral Q8H   mirtazapine   7.5 mg Oral QHS   multivitamin  1 tablet Oral BID   polyethylene glycol  17 g Oral Daily   QUEtiapine   25 mg Oral QHS   sertraline   25 mg Oral Daily   sodium chloride  flush  10-40 mL Intracatheter Q12H   warfarin  2 mg Oral q1600   Warfarin - Pharmacist Dosing Inpatient   Does not apply q1600   Continuous:  anticoagulant sodium citrate      PRN:alteplase , anticoagulant sodium  citrate, artificial tears, bisacodyl  **OR** bisacodyl , feeding supplement, heparin  sodium (porcine), HYDROmorphone  (DILAUDID ) injection, levalbuterol , lidocaine  (PF), lidocaine -prilocaine , lip balm, melatonin, ondansetron  (ZOFRAN ) IV, mouth rinse, oxyCODONE , pentafluoroprop-tetrafluoroeth, pneumococcal 20-valent conjugate vaccine, sodium chloride , sodium chloride , sodium chloride  flush, traZODone    Objective:  Vital Signs  Vitals:   01/03/24 2319 01/04/24 0335 01/04/24 0647 01/04/24 0747  BP: 100/65 96/60  99/60  Pulse: 88 75 80 80  Resp: 19 15 (!) 23 18  Temp: 97.9 F (36.6 C) 98.5 F (36.9 C)  98.6 F (37 C)  TempSrc: Oral Oral  Oral  SpO2: 100% 100% 100% 98%  Weight:   71.2 kg   Height:        Intake/Output Summary (Last 24 hours) at 01/04/2024 1005 Last data filed at 01/03/2024 1300 Gross per 24 hour  Intake --  Output 2000 ml  Net -2000 ml   Filed Weights   01/02/24 0502 01/03/24 0500 01/04/24 0647  Weight: 74.9 kg 72.6 kg 71.2 kg    General appearance: Awake alert.  In no distress Resp: Clear to auscultation bilaterally.  Normal effort Cardio: S1-S2 is normal regular.  No S3-S4.  No rubs murmurs or bruit GI: Abdomen is soft.  Nontender nondistended.  Bowel sounds are present normal.  No masses organomegaly .  Lab Results:  Data Reviewed: I have personally reviewed following  labs and reports of the imaging studies  CBC: Recent Labs  Lab 12/31/23 0519 01/01/24 0733 01/02/24 0245 01/03/24 0500 01/04/24 0618  WBC 7.6 7.4 8.2 7.9 6.9  HGB 8.5* 9.6* 9.4* 9.3* 9.8*  HCT 27.3* 30.6* 30.0* 29.2* 31.7*  MCV 103.8* 102.3* 101.7* 100.7* 101.0*  PLT 174 155 150 158 137*    Basic Metabolic Panel: Recent Labs  Lab 12/31/23 0519 01/01/24 0733 01/02/24 0245 01/03/24 0500 01/04/24 0617  NA 135 135 133* 133* 134*  K 5.0 4.4 5.0 5.5* 4.0  CL 98 92* 94* 95* 94*  CO2 24 27 25 24 27   GLUCOSE 100* 108* 111* 103* 96  BUN 51* 22 38* 55* 26*  CREATININE 3.67* 2.75*  4.20* 5.67* 3.54*  CALCIUM  8.8* 9.3 9.2 9.0 8.8*  MG 3.0* 2.5* 2.7* 2.9* 2.3  PHOS 6.8* 5.7* 7.2* 9.7* 6.3*    GFR: Estimated Creatinine Clearance: 13.6 mL/min (A) (by C-G formula based on SCr of 3.54 mg/dL (H)).  Liver Function Tests: Recent Labs  Lab 12/31/23 0519 01/01/24 0733 01/02/24 0245 01/03/24 0500 01/04/24 0617  ALBUMIN  2.5* 2.8* 2.8* 2.7* 2.7*    Coagulation Profile: Recent Labs  Lab 12/31/23 0519 01/01/24 0733 01/02/24 0435 01/03/24 0500 01/04/24 0618  INR 2.1* 2.3* 2.4* 2.5* 2.4*    CBG: Recent Labs  Lab 01/02/24 2159 01/03/24 0622 01/03/24 1435 01/03/24 1703 01/04/24 0823  GLUCAP 120* 107* 85 133* 101*     Radiology Studies: No results found.     LOS: 36 days   Sister Carbone [employer] on [email or web address]  01/04/2024, 10:05 AM

## 2024-01-04 NOTE — Progress Notes (Signed)
 Inpatient Rehab Admissions Coordinator:    I met with pt. To discuss potential CIR admit. She is interested and stats that her sister is coming to stay with her and can provide 24/7 min A. I have contacted [facility] and they are beginning CLIP process for outpatient dialysis. I continue to follow and will send case to insurance once medically ready.   Leita Kleine, MS, CCC-SLP Rehab Admissions Coordinator  [phone] (celll) [phone] (office)

## 2024-01-04 NOTE — Progress Notes (Signed)
 Patient ID: Vanessa [name], female   DOB: [birth year], 73 y.o.   MRN: [id number] [facility] Progress Note   Assessment/ Plan:   1. Acute kidney Injury on chronic kidney disease stage IIIa: Appears to be primarily from ischemic ATN in the setting of shock (mixed vasoplegic/cardiogenic).  Unfortunately she remains anuric without any evidence of renal recovery and will continue getting dialysis on MWF schedule.  Next ordered for treatment tomorrow. 2.  Severe mitral regurgitation/tricuspid regurgitation: Status post repair with postoperative cardiac tamponade.  Ongoing surveillance by cardiothoracic surgery. 3.  Acute exacerbation of congestive heart failure with reduced ejection fraction: Volume status significantly improved with ongoing hemodialysis/ultrafiltration 4.  Shock: From a combination of cardiogenic and vasoplegic etiology; weaned off of inotropic support and pressors and now on midodrine . 5.  Hyperphosphatemia: Begin phosphorus binder and continue low phosphorus/renal diet.  Subjective:   Reports to have had some nausea after dialysis yesterday.   Objective:   BP 99/60 (BP Location: Left Arm)   Pulse 80   Temp 98.6 F (37 C) (Oral)   Resp 18   Ht 5' 3 (1.6 m)   Wt 71.2 kg   SpO2 98%   BMI 27.81 kg/m   Intake/Output Summary (Last 24 hours) at 01/04/2024 9081 Last data filed at 01/03/2024 1300 Gross per 24 hour  Intake --  Output 2000 ml  Net -2000 ml   Weight change: -1.4 kg  Physical Exam: Gen: Appears comfortable sitting up in bed, eating breakfast CVS: Pulse regular rhythm, normal rate, S1 and S2 normal.  Right IJ TDC Resp: Poor respiratory effort with decreased breath sounds over bases, no rales/rhonchi Abd: Soft, obese, nontender, bowel sounds normal Ext: Trace/1+ lower extremity edema  Imaging: No results found.  Labs: BMET Recent Labs  Lab 12/29/23 1703 12/30/23 0419 12/31/23 0519 01/01/24 0733 01/02/24 0245 01/03/24 0500 01/04/24 0617   NA 135 134* 135 135 133* 133* 134*  K 4.2 4.6 5.0 4.4 5.0 5.5* 4.0  CL 98 99 98 92* 94* 95* 94*  CO2 26 27 24 27 25 24 27   GLUCOSE 131* 101* 100* 108* 111* 103* 96  BUN 25* 33* 51* 22 38* 55* 26*  CREATININE 1.37* 2.11* 3.67* 2.75* 4.20* 5.67* 3.54*  CALCIUM  8.4* 8.5* 8.8* 9.3 9.2 9.0 8.8*  PHOS 3.2 4.5 6.8* 5.7* 7.2* 9.7* 6.3*   CBC Recent Labs  Lab 01/01/24 0733 01/02/24 0245 01/03/24 0500 01/04/24 0618  WBC 7.4 8.2 7.9 6.9  HGB 9.6* 9.4* 9.3* 9.8*  HCT 30.6* 30.0* 29.2* 31.7*  MCV 102.3* 101.7* 100.7* 101.0*  PLT 155 150 158 137*    Medications:     amiodarone   200 mg Oral Daily   ascorbic acid   250 mg Oral BID   Chlorhexidine  Gluconate Cloth  6 each Topical Daily   Chlorhexidine  Gluconate Cloth  6 each Topical Q0600   darbepoetin (ARANESP ) injection - NON-DIALYSIS  100 mcg Subcutaneous Q Tue-1800   feeding supplement  237 mL Oral TID BM   Gerhardt's butt cream   Topical BID   influenza vaccine adjuvanted  0.5 mL Intramuscular Tomorrow-1000   insulin  aspart  0-15 Units Subcutaneous TID WC   metoCLOPramide   5 mg Oral TID AC   midodrine   20 mg Oral Q8H   mirtazapine   7.5 mg Oral QHS   multivitamin  1 tablet Oral BID   polyethylene glycol  17 g Oral Daily   QUEtiapine   25 mg Oral QHS   sertraline   25 mg Oral  Daily   sodium chloride  flush  10-40 mL Intracatheter Q12H   warfarin  2 mg Oral q1600   Warfarin - Pharmacist Dosing Inpatient   Does not apply q1600    Gordy Blanch, MD 01/04/2024, 9:18 AM

## 2024-01-04 NOTE — Progress Notes (Signed)
 PHARMACY - ANTICOAGULATION CONSULT NOTE  Pharmacy Consult for Warfarin Indication: atrial fibrillation  No Known Allergies  Patient Measurements: Height: 5' 3 (160 cm) Weight: 71.2 kg (156 lb 15.5 oz) IBW/kg (Calculated) : 52.4 Heparin  Dosing Weight: ~ 70 kg  Vital Signs: Temp: 98.1 F (36.7 C) (02/04 1119) Temp Source: Oral (02/04 1119) BP: 93/59 (02/04 1119) Pulse Rate: 88 (02/04 1119)  Labs: Recent Labs    01/02/24 0245 01/02/24 0435 01/03/24 0500 01/04/24 0617 01/04/24 0618  HGB 9.4*  --  9.3*  --  9.8*  HCT 30.0*  --  29.2*  --  31.7*  PLT 150  --  158  --  137*  LABPROT  --  26.5* 27.6*  --  26.5*  INR  --  2.4* 2.5*  --  2.4*  CREATININE 4.20*  --  5.67* 3.54*  --    Estimated Creatinine Clearance: 13.6 mL/min (A) (by C-G formula based on SCr of 3.54 mg/dL (H)).  Assessment: 73 yo female s/p scheduled bioprosthetic mitral valve replacement with tissue valve 12/30 now with afib, pharmacy asked to dose anticoagulation with IV heparin . Spontaneously converted 1/7; flipped back to AF overnight 1/9 0300. Baseline INR 1.1, slow uptrend to 1.3 s/p 6 days warfarin therapy, total dose 20 mg, then held 1/21-24 for tunneled access plans. Patient now s/p [facility] and tunneled central line placement 1/24 AM on CRRT, long-term dialysis plan pending.  CBC remains low-stable, off norepinephrine  since 1/26, remains on midodrine . Resumed anticoagulation per VIR after tunneled cath placement. Heparin  stopped 1/31 with INR >2  -INR 2.4, therapeutic  -Hgb 8-9s (stable), pltc 137 -Endorsed some nausea yesterday after eating post dialysis > resolved this AM; good appetite with prn ensure, TF stopped 1/28 -No s/sx of bleeding reported.  Goal of Therapy:  INR goal 2 - 3 (bMVR) Monitor platelets by anticoagulation protocol: Yes   Plan:  Continue with warfarin 2mg  PO x 1 tonight Daily CBC and INR  Maurilio Fila, PharmD Clinical Pharmacist 01/04/2024  11:28 AM

## 2024-01-04 NOTE — Progress Notes (Signed)
 Physical Therapy Treatment Patient Details Name: Vanessa [name] MRN: [id number] DOB: 1951/04/20 Today's Date: 01/04/2024   History of Present Illness 73 yo F admitted 12/30 with DOE, increasing MR and TR. [facility] without CAD. 12/30 MVR and tricuspid valve repair. Intubated 1/2-1/5. 1/2 CRRT started/stopped on 1/12. Pt HD line was removed on 1/13 and CRRT was re-started on 1/14-1/20, CRRT restarted on 1/22 and stopped 1/29.  Began intermittent HD on 12/31/23.   PMhx: HTN, NICM, HFrEF (EF40-45%), Pulmonary HTN, CKD and severe MR s/p prior mitral clip x 2 in 2020.    PT Comments  Patient progressing with mobility and able to tolerate ambulation with RW in the room.  Needing encouragement to participate.  Patient notes feeling ill after HD yesterday after eating.  She seems happy for the day off from dialysis.  Began education regarding rehab schedule and able to complete therapy day prior to HD.  PT will continue to follow and goals updated this session.  Continue to recommend post-acute inpatient rehab (>3 hours/day) at d/c.    If plan is discharge home, recommend the following: Assistance with cooking/housework;Help with stairs or ramp for entrance   Can travel by private vehicle        Equipment Recommendations  Rolling walker (2 wheels);BSC/3in1    Recommendations for Other Services       Precautions / Restrictions Precautions Precautions: Fall;Sternal Restrictions Other Position/Activity Restrictions: sternal precautions     Mobility  Bed Mobility Overal bed mobility: Needs Assistance Bed Mobility: Supine to Sit   Sidelying to sit: Mod assist       General bed mobility comments: lifting help for trunk and A to scoot hips to EOB    Transfers   Equipment used: None Transfers: Sit to/from Stand Sit to Stand: Mod assist, +2 physical assistance, From elevated surface           General transfer comment: initial attempt needing increased help to stand and pt returned to sit  due to posterior bias, elevated bed height for second attempt with less assist needed    Ambulation/Gait Ambulation/Gait assistance: +2 safety/equipment, Min assist Gait Distance (Feet): 30 Feet Assistive device: Rolling walker (2 wheels) Gait Pattern/deviations: Step-to pattern, Step-through pattern, Decreased stride length, Shuffle, Trunk flexed       General Gait Details: in RW assist for balance and turns, encouragement for distance chair follow for safety   Stairs             Wheelchair Mobility     Tilt Bed    Modified Rankin (Stroke Patients Only)       Balance Overall balance assessment: Needs assistance   Sitting balance-Leahy Scale: Good     Standing balance support: Bilateral upper extremity supported, Reliant on assistive device for balance Standing balance-Leahy Scale: Poor                              Cognition Arousal: Alert Behavior During Therapy: WFL for tasks assessed/performed, Flat affect Overall Cognitive Status: Within Functional Limits for tasks assessed                                 General Comments: continues to need encouragement to mobilize, remembered her sternal precautions prior to mobilizing        Exercises General Exercises - Lower Extremity Heel Slides: AROM, Both, 5 reps, Supine  General Comments General comments (skin integrity, edema, etc.): VSS with mobility on 2L O2 throughout      Pertinent Vitals/Pain Pain Assessment Pain Assessment: Faces Faces Pain Scale: Hurts little more Pain Location: buttocks Pain Descriptors / Indicators: Sore Pain Intervention(s): Monitored during session    Home Living                          Prior Function            PT Goals (current goals can now be found in the care plan section) Acute Rehab PT Goals Patient Stated Goal: return home PT Goal Formulation: With patient Time For Goal Achievement: 01/18/24 Potential to Achieve  Goals: Good Progress towards PT goals: Progressing toward goals;Goals updated    Frequency    Min 1X/week      PT Plan      Co-evaluation              AM-PAC PT 6 Clicks Mobility   Outcome Measure  Help needed turning from your back to your side while in a flat bed without using bedrails?: A Lot Help needed moving from lying on your back to sitting on the side of a flat bed without using bedrails?: A Lot Help needed moving to and from a bed to a chair (including a wheelchair)?: A Lot Help needed standing up from a chair using your arms (e.g., wheelchair or bedside chair)?: A Lot Help needed to walk in hospital room?: A Lot Help needed climbing 3-5 steps with a railing? : Total 6 Click Score: 11    End of Session Equipment Utilized During Treatment: Gait belt;Oxygen Activity Tolerance: Patient limited by fatigue Patient left: with call bell/phone within reach;in chair   PT Visit Diagnosis: Other abnormalities of gait and mobility (R26.89);Muscle weakness (generalized) (M62.81);Difficulty in walking, not elsewhere classified (R26.2)     Time: 8487-8457 PT Time Calculation (min) (ACUTE ONLY): 30 min  Charges:    $Gait Training: 8-22 mins $Therapeutic Activity: 8-22 mins PT General Charges $$ ACUTE PT VISIT: 1 Visit                     Micheline Portal, PT Acute Rehabilitation Services [phone] 01/04/2024    Montie Portal 01/04/2024, 8:08 PM

## 2024-01-04 NOTE — Progress Notes (Signed)
 Requested to see pt for out-pt HD needs at d/c. Pt is being evaluated by CIR for possible admission and staff have requested that pt be clipped. Nephrologist agreeable. Met with pt at bedside. Introduced self and explained role. Pt requests a clinic close to her home and would like a MWF 1st shift appt if possible. Referral submitted to [facility] admissions for review. Pt requesting assistance with a medicaid application and assistance with transportation to/from HD at d/c. Contacted TOC staff with pt's request for assistance. Will assist as needed.   Randine Mungo Renal Navigator [phone]

## 2024-01-04 NOTE — PMR Pre-admission (Signed)
 PMR Admission Coordinator Pre-Admission Assessment  [name] is an 73 y.o., female MRN: [id number] DOB: 05/21/1951 Height: 5' 3 (160 cm) Weight: 71.2 kg              Insurance Information HMO: no     PPO:  yes    PCP:      IPA:      80/20:      OTHER:  PRIMARY: [employer]   PPO   Policy#: [id number]       Subscriber: Pt  CM Name:       Phone#:  [phone]-option 1, spoke with Valli     Fax#: [phone] Pre-Cert#: TBD      Employer:  Benefits:  Phone #:      Name:  Eustacio Date: 12/01/2023 - 11/29/2024 Deductible: no deductible ($0) OOP Max: $3,400 ($0 met) CIR: $325/day co-pay for days 1-6, $0/day days 7-90 SNF:  $0/day co-pay for days 1-20, $214/day co-pay for days 21-100; limited to 100 days/benefit period Outpatient: $15/visit co-pay; limited by medical necessity Home Health:  100% coverage DME: 75% coverage; 25% co-insurance Providers: in network  SECONDARY:       Policy#:       Phone#:   Artist:       Phone#:   The Data Processing Manager" for patients in Inpatient Rehabilitation Facilities with attached "Privacy Act Statement-Health Care Records" was provided and verbally reviewed with: Patient  Emergency Contact Information Contact Information     Name Relation Home Work Mobile   Qu[place]by   [phone]   Wright,Crystal Niece   [phone]   Adele Skippy Benne [phone]  [phone]   Georgina Orlean Benne [phone]        Other Contacts   None on File    Current Medical History  Patient Admitting Diagnosis: AKI, new HD History of Present Illness: Pt. Is a 73 yo F with past medical history of TN, NICM, HFrEF (EF40-45%), Pulmonary HTN, CKD and severe MR s/p prior mitral clip x 2 in 2020 who was admitted to [facility]. 12/30 with DOE. [facility] 11/29/23 without CAD. On 11/29/23 Pt. Undwewent MVR and tricuspid valve repair. She demonstrated respiratory failure post op and was  intubated 12/02/23-12/05/23. AKI noted and Pt. Required CRRT 1/2-1/12/25. The CRRT was held on 1/12 because of worsening leukocytosis and poor access with plan to have line holiday. IHD on 1/13 with intra-dialytic hypotension and no UF. Resumed CRRT on 1/14-1/20 (new cath on 1/14) to manage volume. CRRT 1/22- 12/29/23. Pt. Continues to be anuric without signs of renal recovery and was started on MWF HD schedule with plans for outpatient HD.  Pt. Was seen by PT/OT who recommend CIR to assist return to PLOF.    Glasgow Coma Scale Score: 15  Patient's medical record from [facility] has been reviewed by the rehabilitation admission coordinator and physician.  Past Medical History  Past Medical History:  Diagnosis Date   CHF (congestive heart failure) (HCC)    CKD (chronic kidney disease)    Heart murmur    Hypertension    Moderate mitral regurgitation    moderate to severe MR with moderate pulmonary HTN   Multinodular goiter    Nonischemic cardiomyopathy (HCC)    EF 30-35% by echo 2015   Pulmonary HTN (HCC)    moderate with PASP by echo 2015   PVC's (premature ventricular contractions)     Has the patient had major surgery during 100 days prior  to admission? Yes  Family History  family history includes Breast cancer (age of onset: 46) in her sister; Breast cancer (age of onset: 35) in her mother; Heart disease in her father.   Current Medications   Current Facility-Administered Medications:    alteplase  (CATHFLO ACTIVASE ) injection 2 mg, 2 mg, Intracatheter, Once PRN, Babcock, Peter E, NP   amiodarone  (PACERONE ) tablet 200 mg, 200 mg, Oral, Daily, Babcock, Peter E, NP, 200 mg at 01/04/24 9153   anticoagulant sodium citrate  solution 5 mL, 5 mL, Intracatheter, PRN, Babcock, Peter E, NP   artificial tears (LACRILUBE) ophthalmic ointment, , Both Eyes, Q4H PRN, Jenna Maude BRAVO, NP   ascorbic acid  (VITAMIN C ) tablet 250 mg, 250 mg, Oral, BID, Babcock, Peter E, NP, 250  mg at 01/04/24 0848   bisacodyl  (DULCOLAX) EC tablet 10 mg, 10 mg, Oral, Daily PRN, 10 mg at 12/28/23 1158 **OR** bisacodyl  (DULCOLAX) suppository 10 mg, 10 mg, Rectal, Daily PRN, Babcock, Peter E, NP   Chlorhexidine  Gluconate Cloth 2 % PADS 6 each, 6 each, Topical, Q0600, Goldsborough, Kellie, MD, 6 each at 01/04/24 0608   Darbepoetin Alfa  (ARANESP ) injection 100 mcg, 100 mcg, Subcutaneous, Q Tue-1800, Jenna Maude E, NP, 100 mcg at 12/28/23 8147   feeding supplement (ENSURE ENLIVE / ENSURE PLUS) liquid 237 mL, 237 mL, Oral, TID BM, Babcock, Peter E, NP, 237 mL at 01/02/24 2234   feeding supplement (ENSURE ENLIVE / ENSURE PLUS) liquid 237 mL, 237 mL, Oral, TID PRN, Babcock, Peter E, NP   Gerhardt's butt cream, , Topical, BID, Jenna Maude BRAVO, NP, Given at 01/04/24 0847   heparin  sodium (porcine) injection 3,200 Units, 3,200 Units, Intravenous, Q dialysis, Prescilla Beams, MD, 3,200 Units at 01/03/24 1409   HYDROmorphone  (DILAUDID ) injection 0.5 mg, 0.5 mg, Intravenous, Q3H PRN, Babcock, Peter E, NP, 0.5 mg at 12/21/23 2128   insulin  aspart (novoLOG ) injection 0-15 Units, 0-15 Units, Subcutaneous, TID WC, Krishnan, Gokul, MD, 3 Units at 01/04/24 1220   lanthanum  (FOSRENOL ) chewable tablet 500 mg, 500 mg, Oral, TID WC, Tobie Gordy POUR, MD, 500 mg at 01/04/24 1301   levalbuterol  (XOPENEX ) nebulizer solution 0.63 mg, 0.63 mg, Nebulization, Q6H PRN, Babcock, Peter E, NP, 0.63 mg at 12/02/23 9257   lidocaine  (PF) (XYLOCAINE ) 1 % injection 5 mL, 5 mL, Intradermal, PRN, Babcock, Peter E, NP   lidocaine -prilocaine  (EMLA ) cream 1 Application, 1 Application, Topical, PRN, Babcock, Peter E, NP   lip balm (CARMEX) ointment, , Topical, PRN, Jenna Maude BRAVO, NP, Given at 12/24/23 1028   melatonin tablet 3 mg, 3 mg, Oral, QHS PRN, Babcock, Peter E, NP, 3 mg at 01/03/24 2137   metoCLOPramide  (REGLAN ) tablet 5 mg, 5 mg, Oral, Q8H PRN, Krishnan, Gokul, MD   midodrine  (PROAMATINE ) tablet 20 mg, 20 mg, Oral, Q8H,  Babcock, Peter E, NP, 20 mg at 01/04/24 1306   mirtazapine  (REMERON ) tablet 7.5 mg, 7.5 mg, Oral, QHS, Babcock, Peter E, NP, 7.5 mg at 01/03/24 2140   multivitamin (RENA-VIT) tablet 1 tablet, 1 tablet, Oral, BID, Babcock, Peter E, NP, 1 tablet at 01/04/24 9153   ondansetron  (ZOFRAN ) injection 4 mg, 4 mg, Intravenous, Q6H PRN, Babcock, Peter E, NP, 4 mg at 12/31/23 1103   Oral care mouth rinse, 15 mL, Mouth Rinse, PRN, Babcock, Peter E, NP   oxyCODONE  (Oxy IR/ROXICODONE ) immediate release tablet 5 mg, 5 mg, Oral, Q3H PRN, Babcock, Peter E, NP   pentafluoroprop-tetrafluoroeth (GEBAUERS) aerosol 1 Application, 1 Application, Topical, PRN, Babcock, Peter E, NP  pneumococcal 20-valent conjugate vaccine (PREVNAR 20) injection 0.5 mL, 0.5 mL, Intramuscular, Prior to discharge, Babcock, Peter E, NP   polyethylene glycol (MIRALAX  / GLYCOLAX ) packet 17 g, 17 g, Oral, Daily, Jenna Maude BRAVO, NP, 17 g at 01/04/24 9153   QUEtiapine  (SEROQUEL ) tablet 25 mg, 25 mg, Oral, QHS, Babcock, Peter E, NP, 25 mg at 01/03/24 2137   senna-docusate (Senokot-S) tablet 2 tablet, 2 tablet, Oral, BID, Krishnan, Gokul, MD, 2 tablet at 01/04/24 1301   sertraline  (ZOLOFT ) tablet 25 mg, 25 mg, Oral, Daily, Babcock, Peter E, NP, 25 mg at 01/04/24 0846   sodium chloride  (OCEAN) 0.65 % nasal spray 1 spray, 1 spray, Each Nare, PRN, Jenna Maude BRAVO, NP   sodium chloride  flush (NS) 0.9 % injection 10-40 mL, 10-40 mL, Intracatheter, Q12H, Krishnan, Gokul, MD, 10 mL at 01/04/24 0848   sodium chloride  flush (NS) 0.9 % injection 10-40 mL, 10-40 mL, Intracatheter, PRN, Krishnan, Gokul, MD   traZODone  (DESYREL ) tablet 100 mg, 100 mg, Oral, QHS PRN, Babcock, Peter E, NP, 100 mg at 01/03/24 2136   warfarin (COUMADIN ) tablet 2 mg, 2 mg, Oral, q1600, Chand, Sudham, MD, 2 mg at 01/03/24 1512   Warfarin - Pharmacist Dosing Inpatient, , Does not apply, q1600, Jenna Maude BRAVO, NP, 1 each at 01/01/24 1658  Patients Current Diet:  Diet Order              Diet regular Room service appropriate? Yes; Fluid consistency: Thin  Diet effective now                   Precautions / Restrictions Precautions Precautions: Sternal, Fall, Other (comment) Precaution Booklet Issued: No Precaution Comments: Sternal precautions reviewed verbally with pt demonstrating good carryover of training from prior sessions this day (1/29) Restrictions Weight Bearing Restrictions Per Provider Order: Yes RUE Weight Bearing Per Provider Order: Non weight bearing LUE Weight Bearing Per Provider Order: Non weight bearing Other Position/Activity Restrictions: sternal precautions   Has the patient had 2 or more falls or a fall with injury in the past year?Yes  Prior Activity Level Community (5-7x/wk): Pt. was active in the community PTA  Prior Functional Level Prior Function Prior Level of Function : Independent/Modified Independent, Driving  Self Care: Did the patient need help bathing, dressing, using the toilet or eating?  Independent  Indoor Mobility: Did the patient need assistance with walking from room to room (with or without device)? Independent  Stairs: Did the patient need assistance with internal or external stairs (with or without device)? Independent  Functional Cognition: Did the patient need help planning regular tasks such as shopping or remembering to take medications? Independent  Patient Information Are you of Hispanic, Latino/a,or Spanish origin?: A. No, not of Hispanic, Latino/a, or Spanish origin What is your race?: B. Black or African American Do you need or want an interpreter to communicate with a doctor or health care staff?: 0. No  Patient's Response To:  Health Literacy and Transportation Is the patient able to respond to health literacy and transportation needs?: Yes Health Literacy - How often do you need to have someone help you when you read instructions, pamphlets, or other written material from your doctor or  pharmacy?: Never In the past 12 months, has lack of transportation kept you from medical appointments or from getting medications?: No In the past 12 months, has lack of transportation kept you from meetings, work, or from getting things needed for daily living?: No  Home Assistive Devices /  Equipment Home Equipment: None  Prior Device Use: Indicate devices/aids used by the patient prior to current illness, exacerbation or injury? None of the above  Current Functional Level Cognition  Overall Cognitive Status: Within Functional Limits for tasks assessed Current Attention Level: Selective Orientation Level: Oriented X4 Safety/Judgement: Decreased awareness of safety, Decreased awareness of deficits (noted improvements in awareness of deficits as compared to last skilled OT session) General Comments: Pt continues with fluctuating mood. Intermittently up and down today. Continues with difficulty maintaining sternal precautions    Extremity Assessment (includes Sensation/Coordination)  Upper Extremity Assessment: Generalized weakness (tested within sternal precautions)  Lower Extremity Assessment: Generalized weakness    ADLs  Overall ADL's : Needs assistance/impaired Eating/Feeding: Set up, Sitting Grooming: Set up, Sitting Grooming Details (indicate cue type and reason): Initially agreeable for ambulation to sink with encouragement but limited by low BPs Upper Body Bathing: Minimal assistance, Sitting, Cueing for compensatory techniques (adhering to sternal precautions) Upper Body Bathing Details (indicate cue type and reason): simulated sitting EOB Lower Body Bathing: Total assistance Upper Body Dressing : Minimal assistance, Cueing for compensatory techniques, Sitting (adhering to sternal precautions) Lower Body Dressing: Total assistance, Bed level Lower Body Dressing Details (indicate cue type and reason): socks Toilet Transfer: Minimal assistance, +2 for physical assistance, +2  for safety/equipment Toilet Transfer Details (indicate cue type and reason): STS up to EVA and marched in place this session; limited by significant dizziness. Toileting- Clothing Manipulation and Hygiene: Total assistance, +2 for physical assistance, +2 for safety/equipment, Bed level Toileting - Clothing Manipulation Details (indicate cue type and reason): pericare at bed level Functional mobility during ADLs: Minimal assistance (EVA; STS and marching in place this session) General ADL Comments: OT educated pt in compensatory stratagies for increased safety and independence with UB ADLs while adhering to sternal precautions. Pt verbalized and demonstrated understanding through teach back. Pt will benefit from reinforcment of education. Pt conitnues to presetn with decreased activity tolerance, but with significant improvement in activity tolerance noted this session as compared to last skilled OT session.    Mobility  Overal bed mobility: Needs Assistance Bed Mobility: Supine to Sit Rolling: Mod assist Sidelying to sit: Min assist, HOB elevated, +2 for safety/equipment Supine to sit: HOB elevated, Mod assist Sit to supine: Mod assist Sit to sidelying: Mod assist General bed mobility comments: pt able move LEs to EOB, due to inability to push/pull with UEs pt continue to require modA for trunk elevation and to scoot to EOB    Transfers  Overall transfer level: Needs assistance Equipment used: None Transfers: Sit to/from Stand Sit to Stand: Mod assist, +2 physical assistance, +2 safety/equipment, Max assist, From elevated surface Bed to/from chair/wheelchair/BSC transfer type:: Lateral/scoot transfer (at EOB) Stand pivot transfers: Min assist, +2 safety/equipment, +2 physical assistance Step pivot transfers: Min assist, +2 safety/equipment, +2 physical assistance  Lateral/Scoot Transfers: Mod assist, Max assist General transfer comment: pt with fear of falling and resistant to leaning  forward despite max encouragement and plus 2 help to power up, modAx2 to power up from very elevated EOB. Pt then attempted standing from recliner with 2 person Max A for 3 trials and unable to clear hips.    Ambulation / Gait / Stairs / Wheelchair Mobility  Ambulation/Gait Ambulation/Gait assistance: Min assist, +2 physical assistance, +2 safety/equipment Gait Distance (Feet): 40 Feet Assistive device: Elyn Finder Gait Pattern/deviations: Trunk flexed, Step-to pattern, Decreased step length - right, Decreased step length - left, Decreased stride length General Gait Details: minA  to control eva walker Gait velocity: decreased Gait velocity interpretation: <1.31 ft/sec, indicative of household ambulator    Posture / Balance Dynamic Sitting Balance Sitting balance - Comments: close Supervision for safety with pt sitting EOB but no LOB noted this session Balance Overall balance assessment: Needs assistance Sitting-balance support: Single extremity supported, No upper extremity supported, Feet supported Sitting balance-Leahy Scale: Good Sitting balance - Comments: close Supervision for safety with pt sitting EOB but no LOB noted this session Standing balance support: Single extremity supported, Bilateral upper extremity supported, Reliant on assistive device for balance Standing balance-Leahy Scale: Poor Standing balance comment: reliant on external support    Special needs/care consideration Dialysis: Hemodialysis Monday, Wednesday, and Friday and Skin       Previous Home Environment (from acute therapy documentation) Living Arrangements: Alone  Lives With: Other (Comment) Available Help at Discharge: Family, Available 24 hours/day Type of Home: House Home Layout: One level Home Access: Level entry Bathroom Shower/Tub: Engineer, Manufacturing Systems: Standard Bathroom Accessibility: Yes How Accessible: Accessible via walker Home Care Services: No Additional Comments: mom will be  able to assist at D/C  Discharge Living Setting Plans for Discharge Living Setting: Patient's home Type of Home at Discharge: House Discharge Home Layout: One level Discharge Home Access: Level entry Discharge Bathroom Shower/Tub: Tub/shower unit Discharge Bathroom Toilet: Standard Discharge Bathroom Accessibility: Yes How Accessible: Accessible via walker Does the patient have any problems obtaining your medications?: No  Social/Family/Support Systems Patient Roles: Other (Comment) Contact Information: Sister Anticipated Caregiver: Sister and mother coming to stay with pt. to provide 24/7 min A Caregiver Availability: 24/7 Discharge Plan Discussed with Primary Caregiver: Yes Is Caregiver In Agreement with Plan?: Yes Does Caregiver/Family have Issues with Lodging/Transportation while Pt is in Rehab?: No   Goals Patient/Family Goal for Rehab: PT/OT Min A to CGA Expected length of stay: 12-14 days Pt/Family Agrees to Admission and willing to participate: Yes Program Orientation Provided & Reviewed with Pt/Caregiver Including Roles  & Responsibilities: Yes   Decrease burden of Care through IP rehab admission: not anticipated   Possible need for SNF placement upon discharge:not anticipated   Patient Condition: This patient's condition remains as documented in the consult dated 2/11,/25 in which the Rehabilitation Physician determined and documented that the patient's condition is appropriate for intensive rehabilitative care in an inpatient rehabilitation facility. Will admit to inpatient rehab today.  Preadmission Screen Completed By:  Leita KATHEE Kleine, CCC-SLP, 01/04/2024 2:38 PM ______________________________________________________________________   Discussed status with Dr. Lorilee on 01/12/2024 at 322 and received approval for admission today.  Admission Coordinator:  Leita KATHEE Kleine, time 322/Date 01/12/24

## 2024-01-05 DIAGNOSIS — Z952 Presence of prosthetic heart valve: Secondary | ICD-10-CM | POA: Diagnosis not present

## 2024-01-05 LAB — CBC
HCT: 31.5 % — ABNORMAL LOW (ref 36.0–46.0)
Hemoglobin: 9.9 g/dL — ABNORMAL LOW (ref 12.0–15.0)
MCH: 31.2 pg (ref 26.0–34.0)
MCHC: 31.4 g/dL (ref 30.0–36.0)
MCV: 99.4 fL (ref 80.0–100.0)
Platelets: 149 10*3/uL — ABNORMAL LOW (ref 150–400)
RBC: 3.17 MIL/uL — ABNORMAL LOW (ref 3.87–5.11)
RDW: 18.4 % — ABNORMAL HIGH (ref 11.5–15.5)
WBC: 7.8 10*3/uL (ref 4.0–10.5)
nRBC: 0 % (ref 0.0–0.2)

## 2024-01-05 LAB — RENAL FUNCTION PANEL
Albumin: 2.8 g/dL — ABNORMAL LOW (ref 3.5–5.0)
Anion gap: 16 — ABNORMAL HIGH (ref 5–15)
BUN: 40 mg/dL — ABNORMAL HIGH (ref 8–23)
CO2: 26 mmol/L (ref 22–32)
Calcium: 9 mg/dL (ref 8.9–10.3)
Chloride: 90 mmol/L — ABNORMAL LOW (ref 98–111)
Creatinine, Ser: 5.23 mg/dL — ABNORMAL HIGH (ref 0.44–1.00)
GFR, Estimated: 8 mL/min — ABNORMAL LOW (ref >60)
Glucose, Bld: 101 mg/dL — ABNORMAL HIGH (ref 70–99)
Phosphorus: 6.5 mg/dL — ABNORMAL HIGH (ref 2.5–4.6)
Potassium: 4.3 mmol/L (ref 3.5–5.1)
Sodium: 132 mmol/L — ABNORMAL LOW (ref 135–145)

## 2024-01-05 LAB — GLUCOSE, CAPILLARY
Glucose-Capillary: 101 mg/dL — ABNORMAL HIGH (ref 70–99)
Glucose-Capillary: 95 mg/dL (ref 70–99)
Glucose-Capillary: 113 mg/dL — ABNORMAL HIGH (ref 70–99)

## 2024-01-05 LAB — MAGNESIUM: Magnesium: 2.4 mg/dL (ref 1.7–2.4)

## 2024-01-05 LAB — PROTIME-INR
INR: 2.4 — ABNORMAL HIGH (ref 0.8–1.2)
Prothrombin Time: 26.5 s — ABNORMAL HIGH (ref 11.4–15.2)

## 2024-01-05 MED ORDER — ALTEPLASE 2 MG IJ SOLR
2.0000 mg | Freq: Once | INTRAMUSCULAR | Status: AC
  Administered 2024-01-05: 2 mg

## 2024-01-05 NOTE — Progress Notes (Signed)
 Inpatient Rehab Admissions Coordinator:    CIR following. Pt. States her sister will be coming to provide support at d/c, but her sister is not listed in her chart and pt. States she does not have a way to contact her. She states she will try to get her sister's contact information for me.   Leita Kleine, MS, CCC-SLP Rehab Admissions Coordinator  [phone] (celll) [phone] (office)

## 2024-01-05 NOTE — Progress Notes (Signed)
 Patient ID: Vanessa [name], female   DOB: [birth year], 73 y.o.   MRN: [id number] [facility] Progress Note   Assessment/ Plan:   1. Acute kidney Injury on chronic kidney disease stage IIIa: Appears to be primarily from ischemic ATN in the setting of shock (mixed vasoplegic/cardiogenic).  Unfortunately she remains anuric without any evidence of renal recovery and will continue getting dialysis on MWF schedule-getting dialysis at this time.  Process started for outpatient dialysis unit placement as plans are noted for possible admission to inpatient rehabilitation. 2.  Severe mitral regurgitation/tricuspid regurgitation: Status post repair with postoperative cardiac tamponade.  Ongoing surveillance by cardiothoracic surgery. 3.  Acute exacerbation of congestive heart failure with reduced ejection fraction: Volume status significantly improved with ongoing hemodialysis/ultrafiltration 4.  Shock: From a combination of cardiogenic and vasoplegic etiology; weaned off of inotropic support and pressors and now on midodrine . 5.  Hyperphosphatemia: Begin phosphorus binder and continue low phosphorus/renal diet.  Subjective:   Earlier problem with the arterial port of dialysis catheter resolved with alteplase  dwell.  She denies any complaints   Objective:   BP 94/60 (BP Location: Left Wrist)   Pulse 83   Temp 98.1 F (36.7 C)   Resp 17   Ht 5' 3 (1.6 m)   Wt 72.2 kg   SpO2 99%   BMI 28.20 kg/m   Intake/Output Summary (Last 24 hours) at 01/05/2024 1053 Last data filed at 01/04/2024 2200 Gross per 24 hour  Intake 460 ml  Output 0 ml  Net 460 ml   Weight change: -0.3 kg  Physical Exam: Gen: Comfortably resting in dialysis CVS: Pulse regular rhythm, normal rate, S1 and S2 normal.  Right IJ TDC Resp: Poor respiratory effort with decreased breath sounds over bases, no rales/rhonchi Abd: Soft, obese, nontender, bowel sounds normal Ext: Trace/1+ lower extremity edema  Imaging: No  results found.  Labs: BMET Recent Labs  Lab 12/30/23 0419 12/31/23 0519 01/01/24 0733 01/02/24 0245 01/03/24 0500 01/04/24 0617 01/05/24 0500  NA 134* 135 135 133* 133* 134* 132*  K 4.6 5.0 4.4 5.0 5.5* 4.0 4.3  CL 99 98 92* 94* 95* 94* 90*  CO2 27 24 27 25 24 27 26   GLUCOSE 101* 100* 108* 111* 103* 96 101*  BUN 33* 51* 22 38* 55* 26* 40*  CREATININE 2.11* 3.67* 2.75* 4.20* 5.67* 3.54* 5.23*  CALCIUM  8.5* 8.8* 9.3 9.2 9.0 8.8* 9.0  PHOS 4.5 6.8* 5.7* 7.2* 9.7* 6.3* 6.5*   CBC Recent Labs  Lab 01/02/24 0245 01/03/24 0500 01/04/24 0618 01/05/24 0500  WBC 8.2 7.9 6.9 7.8  HGB 9.4* 9.3* 9.8* 9.9*  HCT 30.0* 29.2* 31.7* 31.5*  MCV 101.7* 100.7* 101.0* 99.4  PLT 150 158 137* 149*    Medications:     amiodarone   200 mg Oral Daily   ascorbic acid   250 mg Oral BID   Chlorhexidine  Gluconate Cloth  6 each Topical Q0600   darbepoetin (ARANESP ) injection - NON-DIALYSIS  100 mcg Subcutaneous Q Tue-1800   Gerhardt's butt cream   Topical BID   insulin  aspart  0-15 Units Subcutaneous TID WC   lanthanum   500 mg Oral TID WC   midodrine   20 mg Oral Q8H   mirtazapine   7.5 mg Oral QHS   multivitamin  1 tablet Oral BID   polyethylene glycol  17 g Oral Daily   QUEtiapine   25 mg Oral QHS   senna-docusate  2 tablet Oral BID   sertraline   25 mg Oral Daily  sodium chloride  flush  10-40 mL Intracatheter Q12H   warfarin  2 mg Oral q1600   Warfarin - Pharmacist Dosing Inpatient   Does not apply q1600    Gordy Blanch, MD 01/05/2024, 10:53 AM

## 2024-01-05 NOTE — Progress Notes (Signed)
 Pt came back to room 15 from HD. Reinitiated tele. Vss. Call bell within reach.   Oral Billings, RN

## 2024-01-05 NOTE — Progress Notes (Signed)
 [facility] PROGRESS NOTE   Vanessa [name] FMW:[id number] DOB: 1951-02-14 DOA: 11/29/2023  PCP: Alvera Reagin, PA  Brief History: 72yoF with PMH significant for HTN, NICM, HFrEF (EF40-45%), PHTN, CKD and hx of prior severe MR s/p prior mitral clip x 2 in 2020. Pt with progressive DOE found to have increasing MR and moderate TR despite adjustments in GDMT. Not felt to be candidate for additional mitral clip. Repeat LHC without CAD but showed moderate PHTN. Underwent mitral valve replacement with tissue valve and tricuspid valve repair by Dr. Maryjane on 12/30.  Hospital course complicated by respiratory failure requiring intubation and ventilator support.  Also developed acute kidney injury.  Consultants: Cardiology.  Cardiothoracic surgery.  Nephrology.  Critical care medicine  Significant Hospital Events:  12/30 MVR by Dr. Maryjane; relook for tamponade/washout later in evening without obvious source 1/2 Intubated for respiratory decompensation, iNO started. Vanc, meropenem  started. CRRT started.  1/5 Extubated 1/8 Converted to NSR  1/9 Back to A.fib around 0200 1/11 R introducer out.  Unable to thread right subclavian catheter 1/12 Remains weak still pressor dependent 1/13 Repeat BC sent. Still on pressors, Zosyn  changed to meropenem .  Chest abdomen and pelvis CT obtained : Moderate hemorrhagic pericardial effusion, 5.3 cm hematoma right thrombus. Arterial gas bilateral patchy opacities mild dependent posterior right upper lobe atelectasis was unremarkable layering gallstones; neg for tamponade on repeat Echo 1/14 New right IJ HD catheter placed. Echo obtained to better evaluate her EF is 35 to 40%.  The LV demonstrates regional wall motion abnormalities there is asymmetric left ventricular hypertrophy of the inferior lateral segment RV systolic function moderate regular reduced left atrial wall dilated no mention of clot.  1/15 Still on fairly high-dose norepinephrine  1/16 Cont  CRRT + low dose NE  1/17 RHC confirms low output state, restarted on milrinone  1/20 CRRT stopped 1/21: D/c milrinone  yesterday, ongoing increase NE requirements during nights. Started droxidopa  in addition to midodrine  to aid off NE.  1/22: Levo up to 18 overnight (max so far), cvp 9, restarting crrt today. Received dose methylene blue , Cortisol 21 1/23: Levo down to 9, CVP 8 1/24: Tunneled HD and tunneled dual lumen LIJ CVC placed  1/27: 1U PRBC given for Hgb 7.2 and hypotension. Off levo.  1/29: CRRT stopped. Cortrak clogged, removed. 1/31 tolerated iHD    Subjective/Interval History: In HD   Assessment/Plan:  Postcardiotomy cardiogenic and septic shock She is status post mitral and tricuspid valve replacement. She was in the ICU due to worsening clinical status.  She was intubated.  Subsequently she was extubated.  Stabilized and then transferred to the floor. Seems to be hemodynamically stable currently.  Acute kidney injury/hyperkalemia Followed by nephrology.  It appears that she has progressed to end-stage disease.  She required CRRT in the ICU.   Nephrology is planning dialysis on a Monday Wednesday Friday schedule.    Chronic systolic CHF/pulmonary hypertension/complete heart block EF is 40 to 45%.  Followed by heart failure team.  Low blood pressure has been limiting use of GDMT.  She also has AKI.  Volume status being managed with hemodialysis.  She was on CRRT previously. Requiring midodrine . Complete heart block was transient and has resolved.  Acute respiratory failure with hypoxia Noted to be on oxygen by nasal cannula.  Try to wean as possible.  Mitral regurgitation Status post MitraClip Perinal mitral valve replacement.  Currently on warfarin.  Ventricular ectopy On amiodarone .  Paroxysmal atrial fibrillation/atrial flutter Stable.  Monitor on telemetry.  Noted to be on amiodarone . Anticoagulated with warfarin.  Macrocytic anemia Required blood  transfusion on 1/27.  Hemoglobin has been stable.  No evidence for overt bleeding.  History of goiter Off of methimazole .  TSH was normal.  Outpatient follow-up with endocrinology.  Hyperglycemia HbA1c 6.  SSI.  Situational depression/sleep disturbance Continue Zoloft , Seroquel , melatonin.  Continue mirtazapine .  Deep tissue injury left buttock Wound care consulted.  Poor oral intake She had a core track in the ICU which had to be removed since it got clogged.    DVT Prophylaxis: On warfarin Code Status: Full code Family Communication: Discussed with patient Disposition Plan: Inpatient rehabilitation is recommended     Medications: Scheduled:  amiodarone   200 mg Oral Daily   ascorbic acid   250 mg Oral BID   Chlorhexidine  Gluconate Cloth  6 each Topical Q0600   darbepoetin (ARANESP ) injection - NON-DIALYSIS  100 mcg Subcutaneous Q Tue-1800   Gerhardt's butt cream   Topical BID   insulin  aspart  0-15 Units Subcutaneous TID WC   lanthanum   500 mg Oral TID WC   midodrine   20 mg Oral Q8H   mirtazapine   7.5 mg Oral QHS   multivitamin  1 tablet Oral BID   polyethylene glycol  17 g Oral Daily   QUEtiapine   25 mg Oral QHS   senna-docusate  2 tablet Oral BID   sertraline   25 mg Oral Daily   sodium chloride  flush  10-40 mL Intracatheter Q12H   warfarin  2 mg Oral q1600   Warfarin - Pharmacist Dosing Inpatient   Does not apply q1600   Continuous:  anticoagulant sodium citrate      PRN:alteplase , anticoagulant sodium citrate , artificial tears, bisacodyl  **OR** bisacodyl , feeding supplement, heparin  sodium (porcine), HYDROmorphone  (DILAUDID ) injection, levalbuterol , lidocaine  (PF), lidocaine -prilocaine , lip balm, melatonin, metoCLOPramide , ondansetron  (ZOFRAN ) IV, mouth rinse, oxyCODONE , pentafluoroprop-tetrafluoroeth, pneumococcal 20-valent conjugate vaccine, sodium chloride , sodium chloride  flush, traZODone    Objective:  Vital Signs  Vitals:   01/05/24 1130 01/05/24 1145  01/05/24 1200 01/05/24 1216  BP: 104/62 99/68 103/64 95/73  Pulse: 86 87 89 93  Resp: (!) 21 (!) 22 (!) 27 20  Temp:      TempSrc:      SpO2: 100% 100% 100% 100%  Weight:      Height:        Intake/Output Summary (Last 24 hours) at 01/05/2024 1233 Last data filed at 01/04/2024 2200 Gross per 24 hour  Intake 460 ml  Output 0 ml  Net 460 ml   Filed Weights   01/04/24 0647 01/05/24 0442 01/05/24 0817  Weight: 71.2 kg 70.9 kg 72.2 kg    In HD, NAD .  Lab Results:  Data Reviewed: I have personally reviewed following labs and reports of the imaging studies  CBC: Recent Labs  Lab 01/01/24 0733 01/02/24 0245 01/03/24 0500 01/04/24 0618 01/05/24 0500  WBC 7.4 8.2 7.9 6.9 7.8  HGB 9.6* 9.4* 9.3* 9.8* 9.9*  HCT 30.6* 30.0* 29.2* 31.7* 31.5*  MCV 102.3* 101.7* 100.7* 101.0* 99.4  PLT 155 150 158 137* 149*    Basic Metabolic Panel: Recent Labs  Lab 01/01/24 0733 01/02/24 0245 01/03/24 0500 01/04/24 0617 01/05/24 0500  NA 135 133* 133* 134* 132*  K 4.4 5.0 5.5* 4.0 4.3  CL 92* 94* 95* 94* 90*  CO2 27 25 24 27 26   GLUCOSE 108* 111* 103* 96 101*  BUN 22 38* 55* 26* 40*  CREATININE 2.75* 4.20* 5.67* 3.54* 5.23*  CALCIUM  9.3 9.2 9.0  8.8* 9.0  MG 2.5* 2.7* 2.9* 2.3 2.4  PHOS 5.7* 7.2* 9.7* 6.3* 6.5*    GFR: Estimated Creatinine Clearance: 9.3 mL/min (A) (by C-G formula based on SCr of 5.23 mg/dL (H)).  Liver Function Tests: Recent Labs  Lab 01/01/24 0733 01/02/24 0245 01/03/24 0500 01/04/24 0617 01/05/24 0500  ALBUMIN  2.8* 2.8* 2.7* 2.7* 2.8*    Coagulation Profile: Recent Labs  Lab 01/01/24 0733 01/02/24 0435 01/03/24 0500 01/04/24 0618 01/05/24 0500  INR 2.3* 2.4* 2.5* 2.4* 2.4*    CBG: Recent Labs  Lab 01/04/24 0823 01/04/24 1121 01/04/24 1553 01/04/24 2202 01/05/24 0619  GLUCAP 101* 168* 96 136* 101*     Radiology Studies: No results found.     LOS: 37 days   Vanessa [name]  [facility] Pager on  [email or web address]  01/05/2024, 12:33 PM

## 2024-01-05 NOTE — Progress Notes (Signed)
   01/05/24 1339  Vitals  Temp 98.1 F (36.7 C)  Pulse Rate 96  Resp (!) 26  BP 113/84  SpO2 99 %  O2 Device Nasal Cannula  Weight 71.9 kg  Type of Weight Post-Dialysis  Oxygen Therapy  O2 Flow Rate (L/min) 2 L/min  Patient Activity (if Appropriate) In bed  Pulse Oximetry Type Continuous  Oximetry Probe Site Changed No  During Treatment Monitoring  Blood Flow Rate (mL/min) 0 mL/min  Arterial Pressure (mmHg) 35.55 mmHg  Venous Pressure (mmHg) 35.15 mmHg  TMP (mmHg) 8.68 mmHg  Ultrafiltration Rate (mL/min) 478 mL/min  Dialysate Flow Rate (mL/min) 300 ml/min  Dialysate Potassium Concentration 3  Dialysate Calcium  Concentration 2.5  Duration of HD Treatment -hour(s) 3.5 hour(s)  Cumulative Fluid Removed (mL) per Treatment  1000.07  HD Safety Checks Performed Yes   Received patient in bed to unit.  Alert and oriented.  Informed consent signed and in chart.   TX duration: 3.5 hours  Patient tolerated well.  Transported back to the room  Alert, without acute distress.  Hand-off given to patient's nurse.   Access used: Right Permanent Catheter Access issues: None  Total UF removed: 1000 Medication(s) given: None   Rock Hummer, LPN  [facility]

## 2024-01-05 NOTE — Procedures (Signed)
 Patient seen on Hemodialysis. BP 94/60 (BP Location: Left Wrist)   Pulse 83   Temp 98.1 F (36.7 C)   Resp 17   Ht 5' 3 (1.6 m)   Wt 72.2 kg   SpO2 99%   BMI 28.20 kg/m   QB 400, UF goal 1L Tolerating treatment without complaints at this time.   Gordy Blanch MD [facility]. Office # [phone] Pager # [phone] 10:55 AM

## 2024-01-05 NOTE — Progress Notes (Signed)
 OT Cancellation Note  Patient Details Name: TENEA SENS MRN: [id number] DOB: [birth year]   Cancelled Treatment:    Reason Eval/Treat Not Completed: Patient at procedure or test/ unavailable (Pt currently off unit for HD. OT to reattempt to see pt at a later time as appropriate/available.)  Margarie Rockey HERO., OTR/L, MA Acute Rehab [phone]   Margarie FORBES Horns 01/05/2024, 9:01 AM

## 2024-01-05 NOTE — Progress Notes (Signed)
 Pt's referral submitted to [facility] admissions yesterday. Final determination/approval pending. Will assist as needed.   Lauraine Polite Renal Navigator [phone]

## 2024-01-05 NOTE — Progress Notes (Signed)
 PHARMACY - ANTICOAGULATION CONSULT NOTE  Pharmacy Consult for Warfarin Indication: atrial fibrillation  No Known Allergies  Patient Measurements: Height: 5' 3 (160 cm) Weight: 70.9 kg (156 lb 4.9 oz) IBW/kg (Calculated) : 52.4 Heparin  Dosing Weight: ~ 70 kg  Vital Signs: Temp: 98.5 F (36.9 C) (02/05 0302) Temp Source: Oral (02/05 0302) BP: 102/56 (02/05 0302) Pulse Rate: 83 (02/05 0442)  Labs: Recent Labs    01/03/24 0500 01/04/24 0617 01/04/24 0618 01/05/24 0500  HGB 9.3*  --  9.8* 9.9*  HCT 29.2*  --  31.7* 31.5*  PLT 158  --  137* 149*  LABPROT 27.6*  --  26.5* 26.5*  INR 2.5*  --  2.4* 2.4*  CREATININE 5.67* 3.54*  --  5.23*   Estimated Creatinine Clearance: 9.2 mL/min (A) (by C-G formula based on SCr of 5.23 mg/dL (H)).  Assessment: 73 yo female s/p scheduled bioprosthetic mitral valve replacement with tissue valve 12/30 now with afib, pharmacy asked to dose anticoagulation with IV heparin . Spontaneously converted 1/7; flipped back to AF overnight 1/9 0300. Baseline INR 1.1, slow uptrend to 1.3 s/p 6 days warfarin therapy, total dose 20 mg, then held 1/21-24 for tunneled access plans. Patient now s/p [facility] and tunneled central line placement 1/24 AM on CRRT, long-term dialysis plan pending.  CBC remains low-stable, off norepinephrine  since 1/26, remains on midodrine . Resumed anticoagulation per VIR after tunneled cath placement. Heparin  stopped 1/31 with INR >2  -INR 2.4, therapeutic  -Hgb 8-9s (stable), pltc 149 -PO intake improved (75-80% of meals)  Goal of Therapy:  INR goal 2 - 3 (bMVR) Monitor platelets by anticoagulation protocol: Yes   Plan:  Continue with warfarin 2mg  PO x 1 tonight Daily CBC and INR  Prudence Dollar, PharmD, BCPS 01/05/2024 7:48 AM

## 2024-01-05 NOTE — Progress Notes (Signed)
 Dr. Lydia Sams at bedside. Venous port unable to pull return Dr. Lydia Sams ordered Cathflo to be placed for 30 minutes. Cathflo instilled, starting 30 minutes wait time 0907.

## 2024-01-06 DIAGNOSIS — Z952 Presence of prosthetic heart valve: Secondary | ICD-10-CM | POA: Diagnosis not present

## 2024-01-06 LAB — CBC
HCT: 33.3 % — ABNORMAL LOW (ref 36.0–46.0)
Hemoglobin: 10.5 g/dL — ABNORMAL LOW (ref 12.0–15.0)
MCH: 31.3 pg (ref 26.0–34.0)
MCHC: 31.5 g/dL (ref 30.0–36.0)
MCV: 99.4 fL (ref 80.0–100.0)
Platelets: 151 10*3/uL (ref 150–400)
RBC: 3.35 MIL/uL — ABNORMAL LOW (ref 3.87–5.11)
RDW: 18.6 % — ABNORMAL HIGH (ref 11.5–15.5)
WBC: 8 10*3/uL (ref 4.0–10.5)
nRBC: 0 % (ref 0.0–0.2)

## 2024-01-06 LAB — RENAL FUNCTION PANEL
Albumin: 2.8 g/dL — ABNORMAL LOW (ref 3.5–5.0)
Anion gap: 16 — ABNORMAL HIGH (ref 5–15)
BUN: 21 mg/dL (ref 8–23)
CO2: 27 mmol/L (ref 22–32)
Calcium: 8.9 mg/dL (ref 8.9–10.3)
Chloride: 92 mmol/L — ABNORMAL LOW (ref 98–111)
Creatinine, Ser: 3.1 mg/dL — ABNORMAL HIGH (ref 0.44–1.00)
GFR, Estimated: 15 mL/min — ABNORMAL LOW (ref >60)
Glucose, Bld: 102 mg/dL — ABNORMAL HIGH (ref 70–99)
Phosphorus: 3.3 mg/dL (ref 2.5–4.6)
Potassium: 4 mmol/L (ref 3.5–5.1)
Sodium: 135 mmol/L (ref 135–145)

## 2024-01-06 LAB — PROTIME-INR
INR: 2.2 — ABNORMAL HIGH (ref 0.8–1.2)
Prothrombin Time: 24.8 s — ABNORMAL HIGH (ref 11.4–15.2)

## 2024-01-06 LAB — GLUCOSE, CAPILLARY
Glucose-Capillary: 104 mg/dL — ABNORMAL HIGH (ref 70–99)
Glucose-Capillary: 102 mg/dL — ABNORMAL HIGH (ref 70–99)
Glucose-Capillary: 146 mg/dL — ABNORMAL HIGH (ref 70–99)
Glucose-Capillary: 95 mg/dL (ref 70–99)

## 2024-01-06 LAB — MAGNESIUM: Magnesium: 2.3 mg/dL (ref 1.7–2.4)

## 2024-01-06 NOTE — Progress Notes (Signed)
 Patient ID: KENZLEY KE, female   DOB: [birth year], 73 y.o.   MRN: [id number] [facility] Progress Note   Assessment/ Plan:   1. Acute kidney Injury on chronic kidney disease stage IIIa: Appears to be primarily from ischemic ATN in the setting of shock (mixed vasoplegic/cardiogenic). Unfortunately she appears to have suffered dense renal injury and remains anuric with likelihood of needing long-term dialysis this point.  Process started for outpatient dialysis unit placement as plans are noted for possible admission to inpatient rehabilitation.  Continue hemodialysis on Monday/Wednesday/Friday schedule while here. 2.  Severe mitral regurgitation/tricuspid regurgitation: Status post repair with postoperative cardiac tamponade.  Ongoing surveillance by cardiothoracic surgery. 3.  Acute exacerbation of congestive heart failure with reduced ejection fraction: Volume status significantly improved with ongoing hemodialysis/ultrafiltration 4.  Shock: From a combination of cardiogenic and vasoplegic etiology; weaned off of inotropic support and pressors and now on midodrine . 5.  Hyperphosphatemia: Continue phosphorus binder and continue low phosphorus/renal diet.  Subjective:   Fatigued after dialysis yesterday.  Slept well overnight and feeling better today.   Objective:   BP 110/70   Pulse 86   Temp 97.9 F (36.6 C) (Oral)   Resp 17   Ht 5' 3 (1.6 m)   Wt 70.7 kg   SpO2 96%   BMI 27.61 kg/m   Intake/Output Summary (Last 24 hours) at 01/06/2024 0944 Last data filed at 01/06/2024 0600 Gross per 24 hour  Intake 350 ml  Output 1000 ml  Net -650 ml   Weight change: 1.3 kg  Physical Exam: Gen: Resting comfortably in bed, awake/alert CVS: Pulse regular rhythm, normal rate, S1 and S2 normal.  Right IJ TDC Resp: Poor respiratory effort with decreased breath sounds over bases, no rales/rhonchi Abd: Soft, obese, nontender, bowel sounds normal Ext: Trace/1+ lower extremity  edema  Imaging: No results found.  Labs: BMET Recent Labs  Lab 12/31/23 0519 01/01/24 0733 01/02/24 0245 01/03/24 0500 01/04/24 0617 01/05/24 0500  NA 135 135 133* 133* 134* 132*  K 5.0 4.4 5.0 5.5* 4.0 4.3  CL 98 92* 94* 95* 94* 90*  CO2 24 27 25 24 27 26   GLUCOSE 100* 108* 111* 103* 96 101*  BUN 51* 22 38* 55* 26* 40*  CREATININE 3.67* 2.75* 4.20* 5.67* 3.54* 5.23*  CALCIUM  8.8* 9.3 9.2 9.0 8.8* 9.0  PHOS 6.8* 5.7* 7.2* 9.7* 6.3* 6.5*   CBC Recent Labs  Lab 01/03/24 0500 01/04/24 0618 01/05/24 0500 01/06/24 0445  WBC 7.9 6.9 7.8 8.0  HGB 9.3* 9.8* 9.9* 10.5*  HCT 29.2* 31.7* 31.5* 33.3*  MCV 100.7* 101.0* 99.4 99.4  PLT 158 137* 149* 151    Medications:     amiodarone   200 mg Oral Daily   ascorbic acid   250 mg Oral BID   Chlorhexidine  Gluconate Cloth  6 each Topical Q0600   darbepoetin (ARANESP ) injection - NON-DIALYSIS  100 mcg Subcutaneous Q Tue-1800   Gerhardt's butt cream   Topical BID   insulin  aspart  0-15 Units Subcutaneous TID WC   lanthanum   500 mg Oral TID WC   midodrine   20 mg Oral Q8H   mirtazapine   7.5 mg Oral QHS   multivitamin  1 tablet Oral BID   polyethylene glycol  17 g Oral Daily   QUEtiapine   25 mg Oral QHS   senna-docusate  2 tablet Oral BID   sertraline   25 mg Oral Daily   sodium chloride  flush  10-40 mL Intracatheter Q12H   warfarin  2 mg Oral q1600   Warfarin - Pharmacist Dosing Inpatient   Does not apply q1600    Gordy Blanch, MD 01/06/2024, 9:44 AM

## 2024-01-06 NOTE — Progress Notes (Signed)
 Occupational Therapy Treatment Patient Details Name: Vanessa [name] MRN: [id number] DOB: 1951/04/09 Today's Date: 01/06/2024   History of present illness 73 yo F admitted 12/30 with DOE, increasing MR and TR. [facility] without CAD. 12/30 MVR and tricuspid valve repair. Intubated 1/2-1/5. 1/2 CRRT started/stopped on 1/12. Pt HD line was removed on 1/13 and CRRT was re-started on 1/14-1/20, CRRT restarted on 1/22 and stopped 1/29.  Began intermittent HD on 12/31/23.   PMhx: HTN, NICM, HFrEF (EF40-45%), Pulmonary HTN, CKD and severe MR s/p prior mitral clip x 2 in 2020.   OT comments  OT session focused on training in techniques for increased safety and independence with ADLs and bed mobility and functional transfers during/in preparation for ADLs. Pt currently demonstrates ability to complete UB ADLs largely Set up to Contact guard assist, LB ADLs largely with Max assist +2 for safety in sit/stand, bed mobility with Min-Mod assist, and functional step-pivot transfers with Mod assist to boost up and Min assist once in standing, all while adhering to sternal precautions. Pt participated well in session and is making progress toward goals. Toileting and grooming goals also updated this day based on pt progress. OT to continue to follow acutely. OT continues to recommend AIR at discharge. However, pt reports this session she will not have as much family support in the home as initially anticipated post discharge. Due to this, OT updating post-acute recommendation this day to intensive inpatient skilled rehab services < 3 hours per day to maximize rehab potential.       If plan is discharge home, recommend the following:  Two people to help with walking and/or transfers;Two people to help with bathing/dressing/bathroom;Assistance with cooking/housework;Assist for transportation;Help with stairs or ramp for entrance   Equipment Recommendations  Other (comment) (defer to next level of care)    Recommendations for  Other Services      Precautions / Restrictions Precautions Precautions: Fall;Sternal Precaution Booklet Issued: No Precaution Comments: Sternal precautions reviewed verbally with pt demonstrating good carryover of training from prior sessions this day (1/29) Restrictions Weight Bearing Restrictions Per Provider Order: Yes RUE Weight Bearing Per Provider Order: Non weight bearing LUE Weight Bearing Per Provider Order: Non weight bearing Other Position/Activity Restrictions: sternal precautions       Mobility Bed Mobility Overal bed mobility: Needs Assistance       Supine to sit: Min assist, Mod assist     General bed mobility comments: lifting help for trunk and A to scoot hips to EOB    Transfers Overall transfer level: Needs assistance Equipment used: 1 person hand held assist Transfers: Sit to/from Stand, Bed to chair/wheelchair/BSC Sit to Stand: Mod assist     Step pivot transfers: Min assist, Mod assist     General transfer comment: Mod assist to power up; Min assist once in standing     Balance Overall balance assessment: Needs assistance Sitting-balance support: Single extremity supported, No upper extremity supported, Feet supported Sitting balance-Leahy Scale: Good     Standing balance support: Single extremity supported, Bilateral upper extremity supported, During functional activity Standing balance-Leahy Scale: Poor Standing balance comment: reliant on external support for balance                           ADL either performed or assessed with clinical judgement   ADL Overall ADL's : Needs assistance/impaired Eating/Feeding: Set up;Sitting   Grooming: Set up;Sitting  Upper Body Dressing : Contact guard assist;Sitting;Cueing for compensatory techniques (adhering to sternal precautions)   Lower Body Dressing: Maximal assistance;Sit to/from stand;Sitting/lateral leans+2 for safety/equipment;   Toilet Transfer: Minimal  assistance;Moderate assistance;BSC/3in1 (hand held assist +1; step-pivot) Toilet Transfer Details (indicate cue type and reason): Mods assist to power up then Min assist once in standing Toileting- Clothing Manipulation and Hygiene: Maximal assistance;+2 for safety/equipment;Sit to/from stand         General ADL Comments: pt continues to present with decreased activity tolerance, but with noted improvements in activity tolerance as compared to last skilled OT session    Extremity/Trunk Assessment Upper Extremity Assessment Upper Extremity Assessment: Generalized weakness (tested within precautions)   Lower Extremity Assessment Lower Extremity Assessment: Defer to PT evaluation        Vision       Perception     Praxis      Cognition Arousal: Alert Behavior During Therapy: [facility] for tasks assessed/performed, Flat affect Overall Cognitive Status: Within Functional Limits for tasks assessed                                 General Comments: AAOx4 and pleasant throughout session. Pt continues to need encouragement to mobilize. Able to state and follow sternal precautions Independently this session.        Exercises      Shoulder Instructions       General Comments      Pertinent Vitals/ Pain       Pain Assessment Pain Assessment: Faces Faces Pain Scale: Hurts little more Pain Location: abdomen due to constipation Pain Descriptors / Indicators: Discomfort, Grimacing Pain Intervention(s): Limited activity within patient's tolerance, Monitored during session, Repositioned, Other (comment) (Pt transferred to [facility]. RN notified.)  Home Living                                          Prior Functioning/Environment              Frequency  Min 1X/week        Progress Toward Goals  OT Goals(current goals can now be found in the care plan section)  Progress towards OT goals: Progressing toward goals;Goals updated (grooming and  toileting goals updated based on pt progress)  Acute Rehab OT Goals Patient Stated Goal: to continue with skilled rehab and be able to return home more independent ADL Goals Pt Will Perform Grooming: with modified independence;sitting Pt Will Perform Upper Body Dressing: with set-up;sitting Pt Will Perform Lower Body Dressing: with min assist;sit to/from stand Pt Will Transfer to Toilet: with min assist;ambulating;bedside commode (with least restrictive AD) Additional ADL Goal #1: Pt will identify and implement 2+ energy conservation techniques Additional ADL Goal #2: Pt will recall and implement sternal precautions during ADL  Plan      Co-evaluation                 AM-PAC OT 6 Clicks Daily Activity     Outcome Measure   Help from another person eating meals?: A Little Help from another person taking care of personal grooming?: A Little Help from another person toileting, which includes using toliet, bedpan, or urinal?: A Lot Help from another person bathing (including washing, rinsing, drying)?: A Lot Help from another person to put on and taking off regular upper body clothing?:  A Little Help from another person to put on and taking off regular lower body clothing?: A Lot 6 Click Score: 15    End of Session Equipment Utilized During Treatment: Other (comment) (BSC)  OT Visit Diagnosis: Muscle weakness (generalized) (M62.81);Other (comment) (decreased activity tolerance)   Activity Tolerance Patient tolerated treatment well;Other (comment) (Limited due to constipation)   Patient Left Other (comment);with nursing/sitter in room;with call bell/phone within reach (sitting on [facility]; left with RN and nursing student in room)   Nurse Communication Mobility status;Other (comment) (Pt requests suppository to assist in having a BM)        Time: 9041-8979 OT Time Calculation (min): 22 min  Charges: OT General Charges $OT Visit: 1 Visit OT Treatments $Self Care/Home  Management : 8-22 mins  Margarie Rockey HERO., OTR/L, MA Acute Rehab [phone]   Margarie FORBES Horns 01/06/2024, 10:44 AM

## 2024-01-06 NOTE — Progress Notes (Signed)
Contacted [facility] admissions to request an update on pt's referral. Will await a response.   Olivia Canter Renal Navigator [phone]

## 2024-01-06 NOTE — Progress Notes (Signed)
 [facility] PROGRESS NOTE   Vanessa [name] FMW:[id number] DOB: [birth year] DOA: 11/29/2023  PCP: Alvera Reagin, PA  Brief History: 72yoF with PMH significant for HTN, NICM, HFrEF (EF40-45%), PHTN, CKD and hx of prior severe MR s/p prior mitral clip x 2 in 2020. Pt with progressive DOE found to have increasing MR and moderate TR despite adjustments in GDMT. Not felt to be candidate for additional mitral clip. Repeat LHC without CAD but showed moderate PHTN. Underwent mitral valve replacement with tissue valve and tricuspid valve repair by Dr. Maryjane on 12/30.  Hospital course complicated by respiratory failure requiring intubation and ventilator support.  Also developed acute kidney injury.  Consultants: Cardiology.  Cardiothoracic surgery.  Nephrology.  Critical care medicine  Significant Hospital Events:  12/30 MVR by Dr. Maryjane; relook for tamponade/washout later in evening without obvious source 1/2 Intubated for respiratory decompensation, iNO started. Vanc, meropenem  started. CRRT started.  1/5 Extubated 1/8 Converted to NSR  1/9 Back to A.fib around 0200 1/11 R introducer out.  Unable to thread right subclavian catheter 1/12 Remains weak still pressor dependent 1/13 Repeat BC sent. Still on pressors, Zosyn  changed to meropenem .  Chest abdomen and pelvis CT obtained : Moderate hemorrhagic pericardial effusion, 5.3 cm hematoma right thrombus. Arterial gas bilateral patchy opacities mild dependent posterior right upper lobe atelectasis was unremarkable layering gallstones; neg for tamponade on repeat Echo 1/14 New right IJ HD catheter placed. Echo obtained to better evaluate her EF is 35 to 40%.  The LV demonstrates regional wall motion abnormalities there is asymmetric left ventricular hypertrophy of the inferior lateral segment RV systolic function moderate regular reduced left atrial wall dilated no mention of clot.  1/15 Still on fairly high-dose norepinephrine  1/16 Cont  CRRT + low dose NE  1/17 RHC confirms low output state, restarted on milrinone  1/20 CRRT stopped 1/21: D/c milrinone  yesterday, ongoing increase NE requirements during nights. Started droxidopa  in addition to midodrine  to aid off NE.  1/22: Levo up to 18 overnight (max so far), cvp 9, restarting crrt today. Received dose methylene blue , Cortisol 21 1/23: Levo down to 9, CVP 8 1/24: Tunneled HD and tunneled dual lumen LIJ CVC placed  1/27: 1U PRBC given for Hgb 7.2 and hypotension. Off levo.  1/29: CRRT stopped. Cortrak clogged, removed. 1/31 tolerated iHD    Subjective/Interval History: C/o constipation   Assessment/Plan:  Postcardiotomy cardiogenic and septic shock She is status post mitral and tricuspid valve replacement. She was in the ICU due to worsening clinical status.  She was intubated.  Subsequently she was extubated.  Stabilized and then transferred to the floor. Seems to be hemodynamically stable currently.  Acute kidney injury/hyperkalemia Followed by nephrology.  It appears that she has progressed to end-stage disease.  She required CRRT in the ICU.   Nephrology is planning dialysis on a Monday Wednesday Friday schedule.    Chronic systolic CHF/pulmonary hypertension/complete heart block EF is 40 to 45%.  Followed by heart failure team.  Low blood pressure has been limiting use of GDMT.  She also has AKI.  Volume status being managed with hemodialysis.  She was on CRRT previously. Requiring midodrine . Complete heart block was transient and has resolved.  Acute respiratory failure with hypoxia Noted to be on oxygen by nasal cannula.  Try to wean as possible.  Mitral regurgitation Status post MitraClip Perinal mitral valve replacement.  Currently on warfarin.  Ventricular ectopy On amiodarone .  Paroxysmal atrial fibrillation/atrial flutter Stable.  Monitor on telemetry.  Noted to be on amiodarone . Anticoagulated with warfarin.  Macrocytic anemia Required  blood transfusion on 1/27.  Hemoglobin has been stable.  No evidence for overt bleeding.  History of goiter Off of methimazole .  TSH was normal.  Outpatient follow-up with endocrinology.  Hyperglycemia HbA1c 6.  SSI.  Situational depression/sleep disturbance Continue Zoloft , Seroquel , melatonin.  Continue mirtazapine .  Deep tissue injury left buttock Wound care consulted.  Poor oral intake She had a core track in the ICU which had to be removed since it got clogged. -constipation could be contributing-- aggressive bowel regimen   DVT Prophylaxis: On warfarin Code Status: Full code Family Communication: Discussed with patient Disposition Plan: Inpatient rehabilitation is recommended     Medications: Scheduled:  amiodarone   200 mg Oral Daily   ascorbic acid   250 mg Oral BID   Chlorhexidine  Gluconate Cloth  6 each Topical Q0600   darbepoetin (ARANESP ) injection - NON-DIALYSIS  100 mcg Subcutaneous Q Tue-1800   Gerhardt's butt cream   Topical BID   insulin  aspart  0-15 Units Subcutaneous TID WC   lanthanum   500 mg Oral TID WC   midodrine   20 mg Oral Q8H   mirtazapine   7.5 mg Oral QHS   multivitamin  1 tablet Oral BID   polyethylene glycol  17 g Oral Daily   QUEtiapine   25 mg Oral QHS   senna-docusate  2 tablet Oral BID   sertraline   25 mg Oral Daily   sodium chloride  flush  10-40 mL Intracatheter Q12H   warfarin  2 mg Oral q1600   Warfarin - Pharmacist Dosing Inpatient   Does not apply q1600   Continuous:  anticoagulant sodium citrate      PRN:alteplase , anticoagulant sodium citrate , artificial tears, bisacodyl  **OR** bisacodyl , feeding supplement, heparin  sodium (porcine), HYDROmorphone  (DILAUDID ) injection, levalbuterol , lidocaine  (PF), lidocaine -prilocaine , lip balm, melatonin, metoCLOPramide , ondansetron  (ZOFRAN ) IV, mouth rinse, oxyCODONE , pentafluoroprop-tetrafluoroeth, pneumococcal 20-valent conjugate vaccine, sodium chloride , sodium chloride  flush,  traZODone    Objective:  Vital Signs  Vitals:   01/05/24 2356 01/06/24 0338 01/06/24 0809 01/06/24 0939  BP: 106/67 99/68 100/65 110/70  Pulse: 92 86 83 86  Resp: 20 19 (!) 24 17  Temp:  97.6 F (36.4 C) 97.9 F (36.6 C) 97.9 F (36.6 C)  TempSrc:  Oral Oral Oral  SpO2: 94% 91% 92% 96%  Weight:  70.7 kg    Height:        Intake/Output Summary (Last 24 hours) at 01/06/2024 1212 Last data filed at 01/06/2024 0600 Gross per 24 hour  Intake 350 ml  Output 1000 ml  Net -650 ml   Filed Weights   01/05/24 0817 01/05/24 1339 01/06/24 0338  Weight: 72.2 kg 71.9 kg 70.7 kg     General: Appearance:     Overweight female in no acute distress     Lungs:     respirations unlabored  Heart:    Normal heart rate. Normal rhythm.    MS:   All extremities are intact.   Neurologic:   Awake, alert     Lab Results:  Data Reviewed: I have personally reviewed following labs and reports of the imaging studies  CBC: Recent Labs  Lab 01/02/24 0245 01/03/24 0500 01/04/24 0618 01/05/24 0500 01/06/24 0445  WBC 8.2 7.9 6.9 7.8 8.0  HGB 9.4* 9.3* 9.8* 9.9* 10.5*  HCT 30.0* 29.2* 31.7* 31.5* 33.3*  MCV 101.7* 100.7* 101.0* 99.4 99.4  PLT 150 158 137* 149* 151    Basic Metabolic Panel: Recent Labs  Lab  01/02/24 0245 01/03/24 0500 01/04/24 0617 01/05/24 0500 01/06/24 0445 01/06/24 0500  NA 133* 133* 134* 132*  --  135  K 5.0 5.5* 4.0 4.3  --  4.0  CL 94* 95* 94* 90*  --  92*  CO2 25 24 27 26   --  27  GLUCOSE 111* 103* 96 101*  --  102*  BUN 38* 55* 26* 40*  --  21  CREATININE 4.20* 5.67* 3.54* 5.23*  --  3.10*  CALCIUM  9.2 9.0 8.8* 9.0  --  8.9  MG 2.7* 2.9* 2.3 2.4 2.3  --   PHOS 7.2* 9.7* 6.3* 6.5*  --  3.3    GFR: Estimated Creatinine Clearance: 15.5 mL/min (A) (by C-G formula based on SCr of 3.1 mg/dL (H)).  Liver Function Tests: Recent Labs  Lab 01/02/24 0245 01/03/24 0500 01/04/24 0617 01/05/24 0500 01/06/24 0500  ALBUMIN  2.8* 2.7* 2.7* 2.8* 2.8*     Coagulation Profile: Recent Labs  Lab 01/02/24 0435 01/03/24 0500 01/04/24 0618 01/05/24 0500 01/06/24 0445  INR 2.4* 2.5* 2.4* 2.4* 2.2*    CBG: Recent Labs  Lab 01/04/24 2202 01/05/24 0619 01/05/24 1434 01/05/24 2136 01/06/24 0610  GLUCAP 136* 101* 95 113* 104*     Radiology Studies: No results found.     LOS: 38 days   Harlene RAYMOND Bowl  [facility] Pager on [email or web address]  01/06/2024, 12:12 PM

## 2024-01-06 NOTE — Progress Notes (Signed)
 Physical Therapy Treatment Patient Details Name: Vanessa [name] MRN: [id number] DOB: 12-09-1950 Today's Date: 01/06/2024   History of Present Illness 73 yo F admitted 12/30 with DOE, increasing MR and TR. [facility] without CAD. 12/30 MVR and tricuspid valve repair. Intubated 1/2-1/5. 1/2 CRRT started/stopped on 1/12. Pt HD line was removed on 1/13 and CRRT was re-started on 1/14-1/20, CRRT restarted on 1/22 and stopped 1/29.  Began intermittent HD on 12/31/23.   PMhx: HTN, NICM, HFrEF (EF40-45%), Pulmonary HTN, CKD and severe MR s/p prior mitral clip x 2 in 2020.    PT Comments  Pt is motivated and progressing towards goals slow and steady. Pt was able to perform sit to stand Mod A progressing to Min A with training and education while maintaining sternal precautions this session. Pt was Min A for bed mobility. Due to pt current functional status, home set up and available assistance at home recommending skilled physical therapy services > 3 hours/day in order to address strength, balance and functional mobility to decrease risk for falls, injury, immobility, skin break down and re-hospitalization.      If plan is discharge home, recommend the following: Assistance with cooking/housework;Help with stairs or ramp for entrance     Equipment Recommendations  Rolling walker (2 wheels);BSC/3in1       Precautions / Restrictions Precautions Precautions: Fall;Sternal Precaution Booklet Issued: No Precaution Comments: Sternal precautions reviewed verbally with pt demonstrating good carryover of training from prior sessions this day (1/29) Restrictions Weight Bearing Restrictions Per Provider Order: Yes RUE Weight Bearing Per Provider Order: Non weight bearing LUE Weight Bearing Per Provider Order: Non weight bearing Other Position/Activity Restrictions: sternal precautions     Mobility  Bed Mobility Overal bed mobility: Needs Assistance Bed Mobility: Supine to Sit, Sit to Supine, Rolling Rolling: Min  assist   Supine to sit: Min assist Sit to supine: Mod assist   General bed mobility comments: lifting help for trunk and A to scoot hips to EOB. Frequent reminders for sternal precautions.    Transfers Overall transfer level: Needs assistance Equipment used: 1 person hand held assist Transfers: Sit to/from Stand Sit to Stand: Mod assist, Min assist           General transfer comment: worked on sit to stand this session 3x sit to stand from EOB with verbal cues and demonstration for correct body mechanics with sit to stand to decrease need for physical assistance. Pt was fatigued after 3x and unable to progress gait but significant improvement from mod to Min A without use of UE. Frequent remindes for sternal precautions initially.    Ambulation/Gait   General Gait Details: did not progress due to fatigue after practicing sit to stand.       Balance Overall balance assessment: Needs assistance Sitting-balance support: Single extremity supported, No upper extremity supported, Feet supported Sitting balance-Leahy Scale: Good Sitting balance - Comments: close Supervision for safety with pt sitting EOB but no LOB noted this session   Standing balance support: Single extremity supported, Bilateral upper extremity supported, During functional activity Standing balance-Leahy Scale: Fair Standing balance comment: reliant on external support for balance     Cognition Arousal: Alert Behavior During Therapy: WFL for tasks assessed/performed, Flat affect Overall Cognitive Status: Within Functional Limits for tasks assessed     Memory: Decreased recall of precautions   Safety/Judgement: Decreased awareness of safety, Decreased awareness of deficits                 General  Comments General comments (skin integrity, edema, etc.): Pt requires paced activities in order to manage shortness of breathe.      Pertinent Vitals/Pain Pain Assessment Pain Assessment: Faces Faces  Pain Scale: No hurt Pain Intervention(s): Monitored during session     PT Goals (current goals can now be found in the care plan section) Acute Rehab PT Goals Patient Stated Goal: return home PT Goal Formulation: With patient Time For Goal Achievement: 01/18/24 Potential to Achieve Goals: Good Progress towards PT goals: Progressing toward goals    Frequency    Min 1X/week      PT Plan  Continue with current POC        AM-PAC PT 6 Clicks Mobility   Outcome Measure  Help needed turning from your back to your side while in a flat bed without using bedrails?: A Lot Help needed moving from lying on your back to sitting on the side of a flat bed without using bedrails?: A Lot Help needed moving to and from a bed to a chair (including a wheelchair)?: A Lot Help needed standing up from a chair using your arms (e.g., wheelchair or bedside chair)?: A Lot Help needed to walk in hospital room?: A Lot Help needed climbing 3-5 steps with a railing? : Total 6 Click Score: 11    End of Session Equipment Utilized During Treatment: Gait belt;Oxygen Activity Tolerance: Patient tolerated treatment well Patient left: with call bell/phone within reach;in chair;with bed alarm set Nurse Communication: Mobility status PT Visit Diagnosis: Other abnormalities of gait and mobility (R26.89);Muscle weakness (generalized) (M62.81);Difficulty in walking, not elsewhere classified (R26.2)     Time: 8593-8568 PT Time Calculation (min) (ACUTE ONLY): 25 min  Charges:    $Therapeutic Activity: 23-37 mins PT General Charges $$ ACUTE PT VISIT: 1 Visit                    Dorothyann Maier, DPT, CLT  Acute Rehabilitation Services Office: [phone] (Secure chat preferred)    Dorothyann VEAR Maier 01/06/2024, 3:46 PM

## 2024-01-06 NOTE — Progress Notes (Signed)
 PHARMACY - ANTICOAGULATION CONSULT NOTE  Pharmacy Consult for Warfarin Indication: atrial fibrillation  No Known Allergies  Patient Measurements: Height: 5' 3 (160 cm) Weight: 70.7 kg (155 lb 13.8 oz) IBW/kg (Calculated) : 52.4 Heparin  Dosing Weight: ~ 70 kg  Vital Signs: Temp: 97.6 F (36.4 C) (02/06 0338) Temp Source: Oral (02/06 0338) BP: 99/68 (02/06 0338) Pulse Rate: 86 (02/06 0338)  Labs: Recent Labs    01/04/24 0617 01/04/24 0618 01/04/24 0618 01/05/24 0500 01/06/24 0445  HGB  --  9.8*   < > 9.9* 10.5*  HCT  --  31.7*  --  31.5* 33.3*  PLT  --  137*  --  149* 151  LABPROT  --  26.5*  --  26.5* 24.8*  INR  --  2.4*  --  2.4* 2.2*  CREATININE 3.54*  --   --  5.23*  --    < > = values in this interval not displayed.   Estimated Creatinine Clearance: 9.2 mL/min (A) (by C-G formula based on SCr of 5.23 mg/dL (H)).  Assessment: 73 yo female s/p scheduled bioprosthetic mitral valve replacement with tissue valve 12/30 now with afib, pharmacy asked to dose anticoagulation with IV heparin . Spontaneously converted 1/7; flipped back to AF overnight 1/9 0300. Baseline INR 1.1, slow uptrend to 1.3 s/p 6 days warfarin therapy, total dose 20 mg, then held 1/21-24 for tunneled access plans. Patient now s/p [facility] and tunneled central line placement 1/24 AM on CRRT, long-term dialysis plan pending.  CBC remains low-stable, off norepinephrine  since 1/26, remains on midodrine . Resumed anticoagulation per [facility]IR after tunneled cath placement. Heparin  stopped 1/31 with INR >2  -INR 2.2, therapeutic , CBC stable -PO intake improved (75-80% of meals)  Goal of Therapy:  INR goal 2 - 3 (bMVR) Monitor platelets by anticoagulation protocol: Yes   Plan:  Continue with warfarin 2mg  daily Daily CBC and INR  Prudence Dollar, PharmD, BCPS 01/06/2024 7:24 AM

## 2024-01-06 NOTE — Progress Notes (Signed)
 IP rehab admissions - I met with patient.  She is going to check with her sister in [place] to see if she can stay with patient after discharge home from rehab.  I will follow up again tomorrow, but at this point, she does not have caregiver support after discharge.  [phone]

## 2024-01-07 DIAGNOSIS — Z952 Presence of prosthetic heart valve: Secondary | ICD-10-CM | POA: Diagnosis not present

## 2024-01-07 LAB — CBC
HCT: 33.8 % — ABNORMAL LOW (ref 36.0–46.0)
Hemoglobin: 10.8 g/dL — ABNORMAL LOW (ref 12.0–15.0)
MCH: 31.5 pg (ref 26.0–34.0)
MCHC: 32 g/dL (ref 30.0–36.0)
MCV: 98.5 fL (ref 80.0–100.0)
Platelets: 177 10*3/uL (ref 150–400)
RBC: 3.43 MIL/uL — ABNORMAL LOW (ref 3.87–5.11)
RDW: 18.2 % — ABNORMAL HIGH (ref 11.5–15.5)
WBC: 9.4 10*3/uL (ref 4.0–10.5)
nRBC: 0 % (ref 0.0–0.2)

## 2024-01-07 LAB — GLUCOSE, CAPILLARY
Glucose-Capillary: 134 mg/dL — ABNORMAL HIGH (ref 70–99)
Glucose-Capillary: 80 mg/dL (ref 70–99)
Glucose-Capillary: 129 mg/dL — ABNORMAL HIGH (ref 70–99)
Glucose-Capillary: 111 mg/dL — ABNORMAL HIGH (ref 70–99)

## 2024-01-07 LAB — RENAL FUNCTION PANEL
Albumin: 2.8 g/dL — ABNORMAL LOW (ref 3.5–5.0)
Anion gap: 16 — ABNORMAL HIGH (ref 5–15)
BUN: 35 mg/dL — ABNORMAL HIGH (ref 8–23)
CO2: 26 mmol/L (ref 22–32)
Calcium: 9.1 mg/dL (ref 8.9–10.3)
Chloride: 91 mmol/L — ABNORMAL LOW (ref 98–111)
Creatinine, Ser: 4.79 mg/dL — ABNORMAL HIGH (ref 0.44–1.00)
GFR, Estimated: 9 mL/min — ABNORMAL LOW (ref >60)
Glucose, Bld: 102 mg/dL — ABNORMAL HIGH (ref 70–99)
Phosphorus: 4.6 mg/dL (ref 2.5–4.6)
Potassium: 4.4 mmol/L (ref 3.5–5.1)
Sodium: 133 mmol/L — ABNORMAL LOW (ref 135–145)

## 2024-01-07 LAB — PROTIME-INR
INR: 2.1 — ABNORMAL HIGH (ref 0.8–1.2)
Prothrombin Time: 23.8 s — ABNORMAL HIGH (ref 11.4–15.2)

## 2024-01-07 LAB — MAGNESIUM: Magnesium: 2.3 mg/dL (ref 1.7–2.4)

## 2024-01-07 MED ORDER — HEPARIN SODIUM (PORCINE) 1000 UNIT/ML DIALYSIS
40.0000 [IU]/kg | INTRAMUSCULAR | Status: DC | PRN
  Administered 2024-01-07: 2800 [IU] via INTRAVENOUS_CENTRAL
  Filled 2024-01-07: qty 3

## 2024-01-07 MED ORDER — WARFARIN SODIUM 2 MG PO TABS
4.0000 mg | ORAL_TABLET | Freq: Once | ORAL | Status: AC
  Administered 2024-01-07: 4 mg via ORAL
  Filled 2024-01-07: qty 2

## 2024-01-07 NOTE — Progress Notes (Signed)
 PHARMACY - ANTICOAGULATION CONSULT NOTE  Pharmacy Consult for Warfarin Indication: atrial fibrillation  No Known Allergies  Patient Measurements: Height: 5' 3 (160 cm) Weight: 70.7 kg (155 lb 13.8 oz) IBW/kg (Calculated) : 52.4 Heparin  Dosing Weight: ~ 70 kg  Vital Signs: Temp: 98.2 F (36.8 C) (02/07 0417) Temp Source: Oral (02/07 0417) BP: 106/67 (02/07 0417) Pulse Rate: 85 (02/07 0417)  Labs: Recent Labs    01/05/24 0500 01/06/24 0445 01/06/24 0500 01/07/24 0546  HGB 9.9* 10.5*  --  10.8*  HCT 31.5* 33.3*  --  33.8*  PLT 149* 151  --  177  LABPROT 26.5* 24.8*  --  23.8*  INR 2.4* 2.2*  --  2.1*  CREATININE 5.23*  --  3.10* 4.79*   Estimated Creatinine Clearance: 10 mL/min (A) (by C-G formula based on SCr of 4.79 mg/dL (H)).  Assessment: 73 yo female s/p scheduled bioprosthetic mitral valve replacement with tissue valve 12/30 now with afib, pharmacy asked to dose anticoagulation with IV heparin . Spontaneously converted 1/7; flipped back to AF overnight 1/9 0300. Baseline INR 1.1, slow uptrend to 1.3 s/p 6 days warfarin therapy, total dose 20 mg, then held 1/21-24 for tunneled access plans. Patient now s/p [facility] and tunneled central line placement 1/24 AM on CRRT, plan HD MWF.  CBC remains low-stable, off norepinephrine  since 1/26, remains on midodrine . Resumed anticoagulation per VIR after tunneled cath placement. Heparin  stopped 1/31 with INR >2.  -INR 2.1, therapeutic but downtrending, CBC stable. Had been on warfarin 2mg  daily since 2/1 -PO intake variable  Goal of Therapy:  INR goal 2 - 3 (bMVR) Monitor platelets by anticoagulation protocol: Yes   Plan:  Give slightly boosted dose of warfarin 4mg  today Daily CBC and INR  Prudence Dollar, PharmD, BCPS 01/07/2024 7:44 AM

## 2024-01-07 NOTE — Procedures (Signed)
 Patient seen on Hemodialysis. BP 108/67   Pulse 84   Temp 97.7 F (36.5 C)   Resp (!) 21   Ht 5' 3 (1.6 m)   Wt 69.4 kg   SpO2 97%   BMI 27.10 kg/m   QB 400, UF goal 1.5L Tolerating treatment without complaints at this time.   Gordy Blanch MD [facility] Office # [phone] Pager # [phone] 8:50 AM

## 2024-01-07 NOTE — Progress Notes (Addendum)
 Contacted [facility] admissions to request an update on pt's referral. As of yesterday, awaiting medical clearance from clinic. Will assist as needed.   Randine Mungo Renal Navigator [phone]  Addendum at 3:12 pm: Pt can be accommodated at [facility] [place] on a TTS 12:00 pm chair time. Spoke to pt via phone. Pt does not mind going to that clinic but she really wants a MWF 1st shift appt. Pt is willing to go to any clinic in [place] if able to get this schedule. Contacted [facility] admissions with pt's request and will await a response.

## 2024-01-07 NOTE — Progress Notes (Signed)
 [facility] PROGRESS NOTE   Vanessa [name] FMW:[id number] DOB: 12-18-1950 DOA: 11/29/2023  PCP: Alvera Reagin, PA  Brief History: 72yoF with PMH significant for HTN, NICM, HFrEF (EF40-45%), PHTN, CKD and hx of prior severe MR s/p prior mitral clip x 2 in 2020. Pt with progressive DOE found to have increasing MR and moderate TR despite adjustments in GDMT. Not felt to be candidate for additional mitral clip. Repeat LHC without CAD but showed moderate PHTN. Underwent mitral valve replacement with tissue valve and tricuspid valve repair by Dr. Maryjane on 12/30.  Hospital course complicated by respiratory failure requiring intubation and ventilator support.  Also developed acute kidney injury.  Consultants: Cardiology.  Cardiothoracic surgery.  Nephrology.  Critical care medicine  Significant Hospital Events:  12/30 MVR by Dr. Maryjane; relook for tamponade/washout later in evening without obvious source 1/2 Intubated for respiratory decompensation, iNO started. Vanc, meropenem  started. CRRT started.  1/5 Extubated 1/8 Converted to NSR  1/9 Back to A.fib around 0200 1/11 R introducer out.  Unable to thread right subclavian catheter 1/12 Remains weak still pressor dependent 1/13 Repeat BC sent. Still on pressors, Zosyn  changed to meropenem .  Chest abdomen and pelvis CT obtained : Moderate hemorrhagic pericardial effusion, 5.3 cm hematoma right thrombus. Arterial gas bilateral patchy opacities mild dependent posterior right upper lobe atelectasis was unremarkable layering gallstones; neg for tamponade on repeat Echo 1/14 New right IJ HD catheter placed. Echo obtained to better evaluate her EF is 35 to 40%.  The LV demonstrates regional wall motion abnormalities there is asymmetric left ventricular hypertrophy of the inferior lateral segment RV systolic function moderate regular reduced left atrial wall dilated no mention of clot.  1/15 Still on fairly high-dose norepinephrine  1/16 Cont  CRRT + low dose NE  1/17 RHC confirms low output state, restarted on milrinone  1/20 CRRT stopped 1/21: D/c milrinone  yesterday, ongoing increase NE requirements during nights. Started droxidopa  in addition to midodrine  to aid off NE.  1/22: Levo up to 18 overnight (max so far), cvp 9, restarting crrt today. Received dose methylene blue , Cortisol 21 1/23: Levo down to 9, CVP 8 1/24: Tunneled HD and tunneled dual lumen LIJ CVC placed  1/27: 1U PRBC given for Hgb 7.2 and hypotension. Off levo.  1/29: CRRT stopped. Cortrak clogged, removed. 1/31 tolerated iHD    Subjective/Interval History: In HD today-- had BMs 2/6   Assessment/Plan:  Postcardiotomy cardiogenic and septic shock She is status post mitral and tricuspid valve replacement. She was in the ICU due to worsening clinical status.  She was intubated.  Subsequently she was extubated.  Stabilized and then transferred to the floor. Seems to be hemodynamically stable currently.  Acute kidney injury/hyperkalemia Followed by nephrology.  It appears that she has progressed to end-stage disease.  She required CRRT in the ICU.   Nephrology is planning dialysis on a Monday Wednesday Friday schedule.    Chronic systolic CHF/pulmonary hypertension/complete heart block EF is 40 to 45%.  Followed by heart failure team.  Low blood pressure has been limiting use of GDMT.  She also has AKI.  Volume status being managed with hemodialysis.  She was on CRRT previously. Requiring midodrine . Complete heart block was transient and has resolved.  Acute respiratory failure with hypoxia Noted to be on oxygen by nasal cannula.  Try to wean as possible.  Mitral regurgitation Status post MitraClip Perinal mitral valve replacement.  Currently on warfarin.  Ventricular ectopy On amiodarone .  Paroxysmal atrial fibrillation/atrial flutter Stable.  Monitor on telemetry.  Noted to be on amiodarone . Anticoagulated with warfarin.  Macrocytic  anemia Required blood transfusion on 1/27.  Hemoglobin has been stable.  No evidence for overt bleeding.  History of goiter Off of methimazole .  TSH was normal.  Outpatient follow-up with endocrinology.  Hyperglycemia HbA1c 6.  SSI.  Situational depression/sleep disturbance Continue Zoloft , Seroquel , melatonin.  Continue mirtazapine .  Deep tissue injury left buttock Wound care consulted.  Poor oral intake She had a core track in the ICU which had to be removed since it got clogged. -constipation could be contributing-- aggressive bowel regimen   DVT Prophylaxis: On warfarin Code Status: Full code Family Communication: Discussed with patient Disposition Plan: Inpatient rehabilitation is recommended-- needs care giver post CIR     Medications: Scheduled:  amiodarone   200 mg Oral Daily   ascorbic acid   250 mg Oral BID   Chlorhexidine  Gluconate Cloth  6 each Topical Q0600   darbepoetin (ARANESP ) injection - NON-DIALYSIS  100 mcg Subcutaneous Q Tue-1800   Gerhardt's butt cream   Topical BID   insulin  aspart  0-15 Units Subcutaneous TID WC   lanthanum   500 mg Oral TID WC   midodrine   20 mg Oral Q8H   mirtazapine   7.5 mg Oral QHS   multivitamin  1 tablet Oral BID   polyethylene glycol  17 g Oral Daily   QUEtiapine   25 mg Oral QHS   senna-docusate  2 tablet Oral BID   sertraline   25 mg Oral Daily   sodium chloride  flush  10-40 mL Intracatheter Q12H   warfarin  4 mg Oral ONCE-1600   Warfarin - Pharmacist Dosing Inpatient   Does not apply q1600   Continuous:  anticoagulant sodium citrate      PRN:alteplase , anticoagulant sodium citrate , artificial tears, bisacodyl  **OR** bisacodyl , feeding supplement, heparin  sodium (porcine), HYDROmorphone  (DILAUDID ) injection, levalbuterol , lidocaine  (PF), lidocaine -prilocaine , lip balm, melatonin, metoCLOPramide , ondansetron  (ZOFRAN ) IV, mouth rinse, oxyCODONE , pentafluoroprop-tetrafluoroeth, pneumococcal 20-valent conjugate vaccine, sodium  chloride, sodium chloride  flush, traZODone    Objective:  Vital Signs  Vitals:   01/07/24 1231 01/07/24 1239 01/07/24 1241 01/07/24 1313  BP: 102/80 113/80  111/75  Pulse: 96 92  99  Resp: (!) 24 (!) 21  19  Temp: 98 F (36.7 C)   98.3 F (36.8 C)  TempSrc: Oral   Oral  SpO2: 97% 97%  94%  Weight:   68.7 kg   Height:        Intake/Output Summary (Last 24 hours) at 01/07/2024 1504 Last data filed at 01/07/2024 1231 Gross per 24 hour  Intake --  Output 1500 ml  Net -1500 ml   Filed Weights   01/06/24 0338 01/07/24 0836 01/07/24 1241  Weight: 70.7 kg 69.4 kg 68.7 kg     General: Appearance:     Overweight female in no acute distress     Lungs:     respirations unlabored  Heart:    Normal heart rate. Normal rhythm.    MS:   All extremities are intact.   Neurologic:   Awake, alert     Lab Results:  Data Reviewed: I have personally reviewed following labs and reports of the imaging studies  CBC: Recent Labs  Lab 01/03/24 0500 01/04/24 0618 01/05/24 0500 01/06/24 0445 01/07/24 0546  WBC 7.9 6.9 7.8 8.0 9.4  HGB 9.3* 9.8* 9.9* 10.5* 10.8*  HCT 29.2* 31.7* 31.5* 33.3* 33.8*  MCV 100.7* 101.0* 99.4 99.4 98.5  PLT 158 137* 149* 151 177    Basic  Metabolic Panel: Recent Labs  Lab 01/03/24 0500 01/04/24 0617 01/05/24 0500 01/06/24 0445 01/06/24 0500 01/07/24 0546  NA 133* 134* 132*  --  135 133*  K 5.5* 4.0 4.3  --  4.0 4.4  CL 95* 94* 90*  --  92* 91*  CO2 24 27 26   --  27 26  GLUCOSE 103* 96 101*  --  102* 102*  BUN 55* 26* 40*  --  21 35*  CREATININE 5.67* 3.54* 5.23*  --  3.10* 4.79*  CALCIUM  9.0 8.8* 9.0  --  8.9 9.1  MG 2.9* 2.3 2.4 2.3  --  2.3  PHOS 9.7* 6.3* 6.5*  --  3.3 4.6    GFR: Estimated Creatinine Clearance: 9.9 mL/min (A) (by C-G formula based on SCr of 4.79 mg/dL (H)).  Liver Function Tests: Recent Labs  Lab 01/03/24 0500 01/04/24 0617 01/05/24 0500 01/06/24 0500 01/07/24 0546  ALBUMIN  2.7* 2.7* 2.8* 2.8* 2.8*     Coagulation Profile: Recent Labs  Lab 01/03/24 0500 01/04/24 0618 01/05/24 0500 01/06/24 0445 01/07/24 0546  INR 2.5* 2.4* 2.4* 2.2* 2.1*    CBG: Recent Labs  Lab 01/06/24 1212 01/06/24 1622 01/06/24 2120 01/07/24 0755 01/07/24 1312  GLUCAP 102* 146* 95 134* 80     Radiology Studies: No results found.     LOS: 39 days   Harlene RAYMOND Bowl  [facility] Pager on [email or web address]  01/07/2024, 3:04 PM

## 2024-01-07 NOTE — Progress Notes (Signed)
 Received patient in bed to unit.  Alert and oriented.  Informed consent signed and in chart.   TX duration: 3.5 hours  Patient tolerated well.  Transported back to the room  Alert, without acute distress.  Hand-off given to patient's nurse.   Access used: Right internal jugular HD cath Access issues: A-V and V-A  Total UF removed: 1.5L Medication(s) given: none   01/07/24 1231  Vitals  Temp 98 F (36.7 C)  Temp Source Oral  BP 102/80  MAP (mmHg) 87  Pulse Rate 96  ECG Heart Rate 95  Resp (!) 24  Oxygen Therapy  SpO2 97 %  O2 Device Room Air  During Treatment Monitoring  HD Safety Checks Performed Yes  Intra-Hemodialysis Comments Tx completed;Tolerated well  Dialysis Fluid Bolus Normal Saline  Bolus Amount (mL) 300 mL  Post Treatment  Dialyzer Clearance Lightly streaked  Liters Processed 84  Fluid Removed (mL) 1500 mL  Tolerated HD Treatment Yes  Hemodialysis Catheter Right Internal jugular Double lumen Permanent (Tunneled)  Placement Date/Time: 12/24/23 0921   Serial / Lot #: [id number]  Expiration Date: 02/28/28  Time Out: Correct patient;Correct site;Correct procedure  Maximum sterile barrier precautions: Hand hygiene;Cap;Mask;Sterile gown;Sterile gloves;Large sterile s...  Site Condition No complications  Blue Lumen Status Flushed;Heparin  locked;Dead end cap in place  Red Lumen Status Flushed;Heparin  locked;Dead end cap in place  Purple Lumen Status N/A  Catheter fill solution Heparin  1000 units/ml  Catheter fill volume (Arterial) 1.6 cc  Catheter fill volume (Venous) 1.6  Dressing Type Transparent  Dressing Status Antimicrobial disc/dressing in place;Clean, Dry, Intact  Interventions Other (Comment) (deaccessed)  Drainage Description None  Dressing Change Due 01/09/24  Post treatment catheter status Capped and Clamped     Camellia Brasil LPN [facility]

## 2024-01-08 DIAGNOSIS — Z952 Presence of prosthetic heart valve: Secondary | ICD-10-CM | POA: Diagnosis not present

## 2024-01-08 LAB — RENAL FUNCTION PANEL
Albumin: 3 g/dL — ABNORMAL LOW (ref 3.5–5.0)
Anion gap: 15 (ref 5–15)
BUN: 22 mg/dL (ref 8–23)
CO2: 26 mmol/L (ref 22–32)
Calcium: 9.4 mg/dL (ref 8.9–10.3)
Chloride: 94 mmol/L — ABNORMAL LOW (ref 98–111)
Creatinine, Ser: 3.36 mg/dL — ABNORMAL HIGH (ref 0.44–1.00)
GFR, Estimated: 14 mL/min — ABNORMAL LOW (ref >60)
Glucose, Bld: 108 mg/dL — ABNORMAL HIGH (ref 70–99)
Phosphorus: 3.6 mg/dL (ref 2.5–4.6)
Potassium: 4.1 mmol/L (ref 3.5–5.1)
Sodium: 135 mmol/L (ref 135–145)

## 2024-01-08 LAB — CBC
HCT: 37.5 % (ref 36.0–46.0)
Hemoglobin: 11.8 g/dL — ABNORMAL LOW (ref 12.0–15.0)
MCH: 31.6 pg (ref 26.0–34.0)
MCHC: 31.5 g/dL (ref 30.0–36.0)
MCV: 100.3 fL — ABNORMAL HIGH (ref 80.0–100.0)
Platelets: 183 10*3/uL (ref 150–400)
RBC: 3.74 MIL/uL — ABNORMAL LOW (ref 3.87–5.11)
RDW: 18.3 % — ABNORMAL HIGH (ref 11.5–15.5)
WBC: 9.4 10*3/uL (ref 4.0–10.5)
nRBC: 0.2 % (ref 0.0–0.2)

## 2024-01-08 LAB — PROTIME-INR
INR: 2.1 — ABNORMAL HIGH (ref 0.8–1.2)
Prothrombin Time: 23.6 s — ABNORMAL HIGH (ref 11.4–15.2)

## 2024-01-08 LAB — GLUCOSE, CAPILLARY
Glucose-Capillary: 117 mg/dL — ABNORMAL HIGH (ref 70–99)
Glucose-Capillary: 117 mg/dL — ABNORMAL HIGH (ref 70–99)
Glucose-Capillary: 107 mg/dL — ABNORMAL HIGH (ref 70–99)
Glucose-Capillary: 112 mg/dL — ABNORMAL HIGH (ref 70–99)

## 2024-01-08 LAB — MAGNESIUM: Magnesium: 2.3 mg/dL (ref 1.7–2.4)

## 2024-01-08 MED ORDER — WARFARIN SODIUM 2 MG PO TABS
4.0000 mg | ORAL_TABLET | Freq: Once | ORAL | Status: AC
  Administered 2024-01-08: 4 mg via ORAL
  Filled 2024-01-08: qty 2

## 2024-01-08 NOTE — Progress Notes (Signed)
 [facility] PROGRESS NOTE   Vanessa [name] FMW:[id number] DOB: 1951/09/04 DOA: 11/29/2023  PCP: Alvera Reagin, PA  Brief History: 72yoF with PMH significant for HTN, NICM, HFrEF (EF40-45%), PHTN, CKD and hx of prior severe MR s/p prior mitral clip x 2 in 2020. Pt with progressive DOE found to have increasing MR and moderate TR despite adjustments in GDMT. Not felt to be candidate for additional mitral clip. Repeat LHC without CAD but showed moderate PHTN. Underwent mitral valve replacement with tissue valve and tricuspid valve repair by Dr. Maryjane on 12/30.  Hospital course complicated by respiratory failure requiring intubation and ventilator support.  Also developed acute kidney injury.  Consultants: Cardiology.  Cardiothoracic surgery.  Nephrology.  Critical care medicine  Significant Hospital Events:  12/30 MVR by Dr. Maryjane; relook for tamponade/washout later in evening without obvious source 1/2 Intubated for respiratory decompensation, iNO started. Vanc, meropenem  started. CRRT started.  1/5 Extubated 1/8 Converted to NSR  1/9 Back to A.fib around 0200 1/11 R introducer out.  Unable to thread right subclavian catheter 1/12 Remains weak still pressor dependent 1/13 Repeat BC sent. Still on pressors, Zosyn  changed to meropenem .  Chest abdomen and pelvis CT obtained : Moderate hemorrhagic pericardial effusion, 5.3 cm hematoma right thrombus. Arterial gas bilateral patchy opacities mild dependent posterior right upper lobe atelectasis was unremarkable layering gallstones; neg for tamponade on repeat Echo 1/14 New right IJ HD catheter placed. Echo obtained to better evaluate her EF is 35 to 40%.  The LV demonstrates regional wall motion abnormalities there is asymmetric left ventricular hypertrophy of the inferior lateral segment RV systolic function moderate regular reduced left atrial wall dilated no mention of clot.  1/15 Still on fairly high-dose norepinephrine  1/16 Cont  CRRT + low dose NE  1/17 RHC confirms low output state, restarted on milrinone  1/20 CRRT stopped 1/21: D/c milrinone  yesterday, ongoing increase NE requirements during nights. Started droxidopa  in addition to midodrine  to aid off NE.  1/22: Levo up to 18 overnight (max so far), cvp 9, restarting crrt today. Received dose methylene blue , Cortisol 21 1/23: Levo down to 9, CVP 8 1/24: Tunneled HD and tunneled dual lumen LIJ CVC placed  1/27: 1U PRBC given for Hgb 7.2 and hypotension. Off levo.  1/29: CRRT stopped. Cortrak clogged, removed. 1/31 tolerated iHD Awaiting placement    Subjective/Interval History: Had BMs, no abdominal pain   Assessment/Plan:  Postcardiotomy cardiogenic and septic shock She is status post mitral and tricuspid valve replacement. She was in the ICU due to worsening clinical status.  She was intubated.  Subsequently she was extubated.  Stabilized and then transferred to the floor. Seems to be hemodynamically stable currently.  Acute kidney injury/hyperkalemia Followed by nephrology.  It appears that she has progressed to end-stage disease.  She required CRRT in the ICU.   Nephrology is planning dialysis on a Monday Wednesday Friday schedule.    Chronic systolic CHF/pulmonary hypertension/complete heart block EF is 40 to 45%.  Followed by heart failure team.  Low blood pressure has been limiting use of GDMT.  She also has AKI.  Volume status being managed with hemodialysis.  She was on CRRT previously. - midodrine . Complete heart block was transient and has resolved.  Acute respiratory failure with hypoxia -wean to RA  Mitral regurgitation Status post MitraClip Perinal mitral valve replacement.  Currently on warfarin.  Ventricular ectopy On amiodarone .  Paroxysmal atrial fibrillation/atrial flutter Stable.  Monitor on telemetry.  Noted to be on amiodarone . Anticoagulated  with warfarin.  Macrocytic anemia Required blood transfusion on 1/27.   Hemoglobin has been stable.  No evidence for overt bleeding.  History of goiter Off of methimazole .  TSH was normal.  Outpatient follow-up with endocrinology.  Hyperglycemia HbA1c 6.  SSI.  Situational depression/sleep disturbance Continue Zoloft , Seroquel , melatonin.  Continue mirtazapine .  Deep tissue injury left buttock Wound care consulted.  Poor oral intake She had a core track in the ICU which had to be removed since it got clogged. -constipation could be contributing-- aggressive bowel regimen   DVT Prophylaxis: On warfarin Code Status: Full code Family Communication: Discussed with patient Disposition Plan: Inpatient rehabilitation is recommended--says sister can come stay with her after rehab     Medications: Scheduled:  amiodarone   200 mg Oral Daily   ascorbic acid   250 mg Oral BID   Chlorhexidine  Gluconate Cloth  6 each Topical Q0600   darbepoetin (ARANESP ) injection - NON-DIALYSIS  100 mcg Subcutaneous Q Tue-1800   Gerhardt's butt cream   Topical BID   insulin  aspart  0-15 Units Subcutaneous TID WC   lanthanum   500 mg Oral TID WC   midodrine   20 mg Oral Q8H   mirtazapine   7.5 mg Oral QHS   multivitamin  1 tablet Oral BID   polyethylene glycol  17 g Oral Daily   QUEtiapine   25 mg Oral QHS   senna-docusate  2 tablet Oral BID   sertraline   25 mg Oral Daily   sodium chloride  flush  10-40 mL Intracatheter Q12H   warfarin  4 mg Oral ONCE-1600   Warfarin - Pharmacist Dosing Inpatient   Does not apply q1600   Continuous:  anticoagulant sodium citrate      PRN:alteplase , anticoagulant sodium citrate , artificial tears, bisacodyl  **OR** bisacodyl , feeding supplement, heparin  sodium (porcine), HYDROmorphone  (DILAUDID ) injection, levalbuterol , lidocaine  (PF), lidocaine -prilocaine , lip balm, melatonin, metoCLOPramide , ondansetron  (ZOFRAN ) IV, mouth rinse, oxyCODONE , pentafluoroprop-tetrafluoroeth, pneumococcal 20-valent conjugate vaccine, sodium chloride , sodium chloride   flush, traZODone    Objective:  Vital Signs  Vitals:   01/08/24 0405 01/08/24 0500 01/08/24 0748 01/08/24 1211  BP: 100/73  114/69 102/72  Pulse:   85 86  Resp:   20 17  Temp: 97.7 F (36.5 C)  97.6 F (36.4 C) 97.7 F (36.5 C)  TempSrc: Oral  Oral Oral  SpO2:   92% 96%  Weight:  69.1 kg    Height:       No intake or output data in the 24 hours ending 01/08/24 1319  Filed Weights   01/07/24 0836 01/07/24 1241 01/08/24 0500  Weight: 69.4 kg 68.7 kg 69.1 kg     General: Appearance:     Overweight female in no acute distress     Lungs:     respirations unlabored  Heart:    Normal heart rate. Normal rhythm.    MS:   All extremities are intact.   Neurologic:   Awake, alert     Lab Results:  Data Reviewed: I have personally reviewed following labs and reports of the imaging studies  CBC: Recent Labs  Lab 01/04/24 0618 01/05/24 0500 01/06/24 0445 01/07/24 0546 01/08/24 0640  WBC 6.9 7.8 8.0 9.4 9.4  HGB 9.8* 9.9* 10.5* 10.8* 11.8*  HCT 31.7* 31.5* 33.3* 33.8* 37.5  MCV 101.0* 99.4 99.4 98.5 100.3*  PLT 137* 149* 151 177 183    Basic Metabolic Panel: Recent Labs  Lab 01/04/24 0617 01/05/24 0500 01/06/24 0445 01/06/24 0500 01/07/24 0546 01/08/24 0640  NA 134* 132*  --  135 133* 135  K 4.0 4.3  --  4.0 4.4 4.1  CL 94* 90*  --  92* 91* 94*  CO2 27 26  --  27 26 26   GLUCOSE 96 101*  --  102* 102* 108*  BUN 26* 40*  --  21 35* 22  CREATININE 3.54* 5.23*  --  3.10* 4.79* 3.36*  CALCIUM  8.8* 9.0  --  8.9 9.1 9.4  MG 2.3 2.4 2.3  --  2.3 2.3  PHOS 6.3* 6.5*  --  3.3 4.6 3.6    GFR: Estimated Creatinine Clearance: 14.1 mL/min (A) (by C-G formula based on SCr of 3.36 mg/dL (H)).  Liver Function Tests: Recent Labs  Lab 01/04/24 0617 01/05/24 0500 01/06/24 0500 01/07/24 0546 01/08/24 0640  ALBUMIN  2.7* 2.8* 2.8* 2.8* 3.0*    Coagulation Profile: Recent Labs  Lab 01/04/24 0618 01/05/24 0500 01/06/24 0445 01/07/24 0546 01/08/24 0640  INR  2.4* 2.4* 2.2* 2.1* 2.1*    CBG: Recent Labs  Lab 01/07/24 1312 01/07/24 1656 01/07/24 2145 01/08/24 0603 01/08/24 1213  GLUCAP 80 129* 111* 117* 117*     Radiology Studies: No results found.     LOS: 40 days   Vanessa [name]  [facility] Pager on [email or web address]  01/08/2024, 1:19 PM

## 2024-01-08 NOTE — Progress Notes (Signed)
 PHARMACY - ANTICOAGULATION CONSULT NOTE  Pharmacy Consult for Warfarin Indication: atrial fibrillation  No Known Allergies  Patient Measurements: Height: 5' 3 (160 cm) Weight: 69.1 kg (152 lb 5.4 oz) IBW/kg (Calculated) : 52.4 Heparin  Dosing Weight: ~ 70 kg  Vital Signs: Temp: 97.6 F (36.4 C) (02/08 0748) Temp Source: Oral (02/08 0748) BP: 114/69 (02/08 0748) Pulse Rate: 85 (02/08 0748)  Labs: Recent Labs    01/06/24 0445 01/06/24 0500 01/07/24 0546 01/08/24 0640  HGB 10.5*  --  10.8* 11.8*  HCT 33.3*  --  33.8* 37.5  PLT 151  --  177 183  LABPROT 24.8*  --  23.8* 23.6*  INR 2.2*  --  2.1* 2.1*  CREATININE  --  3.10* 4.79* 3.36*   Estimated Creatinine Clearance: 14.1 mL/min (A) (by C-G formula based on SCr of 3.36 mg/dL (H)).  Assessment: 73 yo female s/p scheduled bioprosthetic mitral valve replacement with tissue valve 12/30 now with afib, pharmacy asked to dose anticoagulation with IV heparin . Spontaneously converted 1/7; flipped back to AF overnight 1/9 0300. Baseline INR 1.1, slow uptrend to 1.3 s/p 6 days warfarin therapy, total dose 20 mg, then held 1/21-24 for tunneled access plans. Patient now s/p [facility] and tunneled central line placement 1/24 AM on CRRT, plan HD MWF.  CBC remains low-stable, off norepinephrine  since 1/26, remains on midodrine . Resumed anticoagulation per VIR after tunneled cath placement. Heparin  stopped 1/31 with INR >2.  2/8: INR 2.1 again today, therapeutic but downtrending, CBC stable. Had warfarin 4mg  x 1 on 2/7, will repeat this dose. Had previously been on warfarin 2mg  daily since 2/1 -PO intake variable  Goal of Therapy:  INR goal 2 - 3 (bMVR) Monitor platelets by anticoagulation protocol: Yes   Plan:  Give slightly boosted dose of warfarin 4mg  again today Daily CBC and INR  Chiquita LOIS Morin, PharmD, BCPS PGY2 Pharmacy Resident

## 2024-01-08 NOTE — Progress Notes (Signed)
 Inpatient Rehab Admissions Coordinator:  Saw pt at bedside. She gave [facility] her sister Janice's phone number. Spoke with Romero on the telephone. She lives in [place]. She confirmed that she can come stay with pt for 2-3 weeks after discharge to provide support. Primary AC will follow up with pt and sister next week.   Tinnie Yvone Cohens, MS, CCC-SLP Admissions Coordinator [phone]

## 2024-01-08 NOTE — Progress Notes (Signed)
 Pt received in bed, agrees to education. Provided [facility] postop booklet. Reviewed wound care, S/S to report to MD, daily weights, and move-in-the-tube restrictions. Pt is hopeful her  sister can come and stay with her so she can go home. We reviewed S/S of CHF and low NA diet and medication compliance were stressed. Discussed heart healthy diet.  I had patient practice I/S, pulled 500 mL. Encouraged 10 x/hr. Instructed to continue at home once discharged. Reviewed exercise guidelines and S/S to terminate activity. Discussed CRP2 program, pt voices interest. Will refer to [facility]. Pt verbalized understanding of the education provided.   Alm Parkins MS, ACSM-CEP, CCRP  11:25-12:04

## 2024-01-08 NOTE — Plan of Care (Signed)
   Problem: Education: Goal: Knowledge of General Education information will improve Description Including pain rating scale, medication(s)/side effects and non-pharmacologic comfort measures Outcome: Progressing

## 2024-01-08 NOTE — Progress Notes (Signed)
 Patient ID: Vanessa [name], female   DOB: [birth year], 73 y.o.   MRN: [id number] [facility] Progress Note   Assessment/ Plan:   1. Acute kidney Injury on chronic kidney disease stage IIIa: Appears to be primarily from ischemic ATN in the setting of shock (mixed vasoplegic/cardiogenic). Unfortunately she appears to have suffered dense renal injury and remains anuric with a high likelihood of long-term dialysis dependency as an outpatient; labs will be followed closely even after discharge for indicators of renal recovery.  While here, continue MWF dialysis schedule with next dialysis due 2/10.  She has been accepted to a TTS second shift dialysis schedule at [facility] and expresses the desire to have on MWF shift instead; renal navigator has reached out to patient placement to inquire about this. 2.  Severe mitral regurgitation/tricuspid regurgitation: Status post repair with postoperative cardiac tamponade.  Ongoing surveillance by cardiothoracic surgery. 3.  Acute exacerbation of congestive heart failure with reduced ejection fraction: Volume status significantly improved with ongoing hemodialysis/ultrafiltration 4.  Shock: From a combination of cardiogenic and vasoplegic etiology; weaned off of inotropic support and pressors and now on midodrine . 5.  Hyperphosphatemia: Continue phosphorus binder and continue low phosphorus/renal diet.  Subjective:   Tolerated dialysis yesterday without problems and states that she had a bowel movement with large volume of urine output there after.  She informs me that she will need assistance setting up transportation from her home to dialysis upon discharge.   Objective:   BP 114/69 (BP Location: Left Arm)   Pulse 85   Temp 97.6 F (36.4 C) (Oral)   Resp 20   Ht 5' 3 (1.6 m)   Wt 69.1 kg   SpO2 92%   BMI 26.99 kg/m   Intake/Output Summary (Last 24 hours) at 01/08/2024 0913 Last data filed at 01/07/2024 1231 Gross per 24  hour  Intake --  Output 1500 ml  Net -1500 ml   Weight change:   Physical Exam: Gen: Comfortably sitting up in bed, eating breakfast CVS: Pulse regular rhythm, normal rate, S1 and S2 normal.  Right IJ TDC Resp: Poor respiratory effort with decreased breath sounds over bases, no rales/rhonchi Abd: Soft, obese, nontender, bowel sounds normal Ext: Trace/1+ lower extremity edema  Imaging: No results found.  Labs: BMET Recent Labs  Lab 01/02/24 0245 01/03/24 0500 01/04/24 0617 01/05/24 0500 01/06/24 0500 01/07/24 0546 01/08/24 0640  NA 133* 133* 134* 132* 135 133* 135  K 5.0 5.5* 4.0 4.3 4.0 4.4 4.1  CL 94* 95* 94* 90* 92* 91* 94*  CO2 25 24 27 26 27 26 26   GLUCOSE 111* 103* 96 101* 102* 102* 108*  BUN 38* 55* 26* 40* 21 35* 22  CREATININE 4.20* 5.67* 3.54* 5.23* 3.10* 4.79* 3.36*  CALCIUM  9.2 9.0 8.8* 9.0 8.9 9.1 9.4  PHOS 7.2* 9.7* 6.3* 6.5* 3.3 4.6 3.6   CBC Recent Labs  Lab 01/05/24 0500 01/06/24 0445 01/07/24 0546 01/08/24 0640  WBC 7.8 8.0 9.4 9.4  HGB 9.9* 10.5* 10.8* 11.8*  HCT 31.5* 33.3* 33.8* 37.5  MCV 99.4 99.4 98.5 100.3*  PLT 149* 151 177 183    Medications:     amiodarone   200 mg Oral Daily   ascorbic acid   250 mg Oral BID   Chlorhexidine  Gluconate Cloth  6 each Topical Q0600   darbepoetin (ARANESP ) injection - NON-DIALYSIS  100 mcg Subcutaneous Q Tue-1800   Gerhardt's butt cream   Topical BID   insulin  aspart  0-15  Units Subcutaneous TID WC   lanthanum   500 mg Oral TID WC   midodrine   20 mg Oral Q8H   mirtazapine   7.5 mg Oral QHS   multivitamin  1 tablet Oral BID   polyethylene glycol  17 g Oral Daily   QUEtiapine   25 mg Oral QHS   senna-docusate  2 tablet Oral BID   sertraline   25 mg Oral Daily   sodium chloride  flush  10-40 mL Intracatheter Q12H   Warfarin - Pharmacist Dosing Inpatient   Does not apply [id number]    Gordy Blanch, MD 01/08/2024, 9:13 AM

## 2024-01-08 NOTE — Progress Notes (Signed)
 Patient refused to use BSC.  Stated that "it takes too long to get up."  Requested bedpan.

## 2024-01-09 DIAGNOSIS — Z952 Presence of prosthetic heart valve: Secondary | ICD-10-CM | POA: Diagnosis not present

## 2024-01-09 LAB — CBC
HCT: 35.9 % — ABNORMAL LOW (ref 36.0–46.0)
Hemoglobin: 11.4 g/dL — ABNORMAL LOW (ref 12.0–15.0)
MCH: 31.4 pg (ref 26.0–34.0)
MCHC: 31.8 g/dL (ref 30.0–36.0)
MCV: 98.9 fL (ref 80.0–100.0)
Platelets: 180 10*3/uL (ref 150–400)
RBC: 3.63 MIL/uL — ABNORMAL LOW (ref 3.87–5.11)
RDW: 17.9 % — ABNORMAL HIGH (ref 11.5–15.5)
WBC: 10.4 10*3/uL (ref 4.0–10.5)
nRBC: 0 % (ref 0.0–0.2)

## 2024-01-09 LAB — GLUCOSE, CAPILLARY
Glucose-Capillary: 101 mg/dL — ABNORMAL HIGH (ref 70–99)
Glucose-Capillary: 139 mg/dL — ABNORMAL HIGH (ref 70–99)
Glucose-Capillary: 95 mg/dL (ref 70–99)
Glucose-Capillary: 117 mg/dL — ABNORMAL HIGH (ref 70–99)

## 2024-01-09 LAB — MAGNESIUM: Magnesium: 2.3 mg/dL (ref 1.7–2.4)

## 2024-01-09 LAB — RENAL FUNCTION PANEL
Albumin: 3 g/dL — ABNORMAL LOW (ref 3.5–5.0)
Anion gap: 15 (ref 5–15)
BUN: 38 mg/dL — ABNORMAL HIGH (ref 8–23)
CO2: 25 mmol/L (ref 22–32)
Calcium: 9.2 mg/dL (ref 8.9–10.3)
Chloride: 92 mmol/L — ABNORMAL LOW (ref 98–111)
Creatinine, Ser: 4.85 mg/dL — ABNORMAL HIGH (ref 0.44–1.00)
GFR, Estimated: 9 mL/min — ABNORMAL LOW (ref >60)
Glucose, Bld: 110 mg/dL — ABNORMAL HIGH (ref 70–99)
Phosphorus: 4 mg/dL (ref 2.5–4.6)
Potassium: 4.3 mmol/L (ref 3.5–5.1)
Sodium: 132 mmol/L — ABNORMAL LOW (ref 135–145)

## 2024-01-09 LAB — PROTIME-INR
INR: 2.1 — ABNORMAL HIGH (ref 0.8–1.2)
Prothrombin Time: 24 s — ABNORMAL HIGH (ref 11.4–15.2)

## 2024-01-09 MED ORDER — WARFARIN - PHARMACIST DOSING INPATIENT: Freq: Every day | Status: DC

## 2024-01-09 MED ORDER — WARFARIN SODIUM 2 MG PO TABS
4.0000 mg | ORAL_TABLET | Freq: Once | ORAL | Status: AC
  Administered 2024-01-09: 4 mg via ORAL
  Filled 2024-01-09: qty 2

## 2024-01-09 NOTE — Progress Notes (Signed)
 PHARMACY - ANTICOAGULATION CONSULT NOTE  Pharmacy Consult for Warfarin Indication: atrial fibrillation  No Known Allergies  Patient Measurements: Height: 5' 3 (160 cm) Weight: 69.1 kg (152 lb 5.4 oz) IBW/kg (Calculated) : 52.4 Heparin  Dosing Weight: ~ 70 kg  Vital Signs: Temp: 98.7 F (37.1 C) (02/09 0753) Temp Source: Oral (02/09 0753) BP: 90/61 (02/09 0824) Pulse Rate: 82 (02/09 0824)  Labs: Recent Labs    01/07/24 0546 01/08/24 0640 01/09/24 0350  HGB 10.8* 11.8* 11.4*  HCT 33.8* 37.5 35.9*  PLT 177 183 180  LABPROT 23.8* 23.6* 24.0*  INR 2.1* 2.1* 2.1*  CREATININE 4.79* 3.36* 4.85*   Estimated Creatinine Clearance: 9.8 mL/min (A) (by C-G formula based on SCr of 4.85 mg/dL (H)).  Assessment: 73 yo female s/p scheduled bioprosthetic mitral valve replacement with tissue valve 12/30 now with afib, pharmacy asked to dose anticoagulation with IV heparin . Spontaneously converted 1/7; flipped back to AF overnight 1/9 0300. Baseline INR 1.1, slow uptrend to 1.3 s/p 6 days warfarin therapy, total dose 20 mg, then held 1/21-24 for tunneled access plans. Patient now s/p [facility] and tunneled central line placement 1/24 AM on CRRT, plan HD MWF.  CBC remains low-stable, off norepinephrine  since 1/26, remains on midodrine . Resumed anticoagulation per VIR after tunneled cath placement. Heparin  stopped 1/31 with INR >2.  2/9: INR 2.1 again today, therapeutic but stable at very low end of goal range. , CBC stable. Had warfarin 4mg  x 1 on 2/7 and 2/8, will repeat this dose. Had previously been on warfarin 2mg  daily since 2/1 - reasonable to have not yet seen the effect of going from 2mg  to 4mg .    Goal of Therapy:  INR goal 2 - 3 (bMVR) Monitor platelets by anticoagulation protocol: Yes   Plan:  Give warfarin 4mg  again today Daily CBC and INR  Chiquita LOIS Morin, PharmD, BCPS PGY2 Pharmacy Resident

## 2024-01-09 NOTE — Progress Notes (Signed)
 [facility] PROGRESS NOTE   Vanessa [name] FMW:[id number] DOB: [birth year] DOA: 11/29/2023  PCP: Alvera Reagin, PA  Brief History: 72yoF with PMH significant for HTN, NICM, HFrEF (EF40-45%), PHTN, CKD and hx of prior severe MR s/p prior mitral clip x 2 in 2020. Pt with progressive DOE found to have increasing MR and moderate TR despite adjustments in GDMT. Not felt to be candidate for additional mitral clip. Repeat LHC without CAD but showed moderate PHTN. Underwent mitral valve replacement with tissue valve and tricuspid valve repair by Dr. Maryjane on 12/30.  Hospital course complicated by respiratory failure requiring intubation and ventilator support.  Also developed acute kidney injury.  Consultants: Cardiology.  Cardiothoracic surgery.  Nephrology.  Critical care medicine  Significant Hospital Events:  12/30 MVR by Dr. Maryjane; relook for tamponade/washout later in evening without obvious source 1/2 Intubated for respiratory decompensation, iNO started. Vanc, meropenem  started. CRRT started.  1/5 Extubated 1/8 Converted to NSR  1/9 Back to A.fib around 0200 1/11 R introducer out.  Unable to thread right subclavian catheter 1/12 Remains weak still pressor dependent 1/13 Repeat BC sent. Still on pressors, Zosyn  changed to meropenem .  Chest abdomen and pelvis CT obtained : Moderate hemorrhagic pericardial effusion, 5.3 cm hematoma right thrombus. Arterial gas bilateral patchy opacities mild dependent posterior right upper lobe atelectasis was unremarkable layering gallstones; neg for tamponade on repeat Echo 1/14 New right IJ HD catheter placed. Echo obtained to better evaluate her EF is 35 to 40%.  The LV demonstrates regional wall motion abnormalities there is asymmetric left ventricular hypertrophy of the inferior lateral segment RV systolic function moderate regular reduced left atrial wall dilated no mention of clot.  1/15 Still on fairly high-dose norepinephrine  1/16 Cont  CRRT + low dose NE  1/17 RHC confirms low output state, restarted on milrinone  1/20 CRRT stopped 1/21: D/c milrinone  yesterday, ongoing increase NE requirements during nights. Started droxidopa  in addition to midodrine  to aid off NE.  1/22: Levo up to 18 overnight (max so far), cvp 9, restarting crrt today. Received dose methylene blue , Cortisol 21 1/23: Levo down to 9, CVP 8 1/24: Tunneled HD and tunneled dual lumen LIJ CVC placed  1/27: 1U PRBC given for Hgb 7.2 and hypotension. Off levo.  1/29: CRRT stopped. Cortrak clogged, removed. 1/31 tolerated iHD Awaiting placement    Subjective/Interval History: Having BMs   Assessment/Plan:  Postcardiotomy cardiogenic and septic shock She is status post mitral and tricuspid valve replacement. She was in the ICU due to worsening clinical status.  She was intubated.  Subsequently she was extubated.  Stabilized and then transferred to the floor. Seems to be hemodynamically stable currently.  Acute kidney injury/hyperkalemia Followed by nephrology.  It appears that she has progressed to end-stage disease.  She required CRRT in the ICU.   Nephrology is planning dialysis on a Monday Wednesday Friday schedule.    Chronic systolic CHF/pulmonary hypertension/complete heart block EF is 40 to 45%.  Followed by heart failure team.  Low blood pressure has been limiting use of GDMT.  She also has AKI.  Volume status being managed with hemodialysis.  She was on CRRT previously. - midodrine . Complete heart block was transient and has resolved.  Acute respiratory failure with hypoxia -wean to RA  Mitral regurgitation Status post MitraClip Perinal mitral valve replacement.  Currently on warfarin.  Ventricular ectopy On amiodarone .  Paroxysmal atrial fibrillation/atrial flutter Stable.  Monitor on telemetry.  Noted to be on amiodarone . Anticoagulated with warfarin.  Macrocytic anemia Required blood transfusion on 1/27.  Hemoglobin has been  stable.  No evidence for overt bleeding.  History of goiter Off of methimazole .  TSH was normal.  Outpatient follow-up with endocrinology.  Hyperglycemia HbA1c 6.  SSI.  Situational depression/sleep disturbance Continue Zoloft , Seroquel , melatonin.  Continue mirtazapine .  Deep tissue injury left buttock Wound care consulted.  Poor oral intake She had a core track in the ICU which had to be removed since it got clogged. -constipation could be contributing-- aggressive bowel regimen   DVT Prophylaxis: On warfarin Code Status: Full code Family Communication: Discussed with patient Disposition Plan: Inpatient rehabilitation is recommended--says sister can come stay with her after rehab     Medications: Scheduled:  amiodarone   200 mg Oral Daily   ascorbic acid   250 mg Oral BID   Chlorhexidine  Gluconate Cloth  6 each Topical Q0600   darbepoetin (ARANESP ) injection - NON-DIALYSIS  100 mcg Subcutaneous Q Tue-1800   Gerhardt's butt cream   Topical BID   insulin  aspart  0-15 Units Subcutaneous TID WC   lanthanum   500 mg Oral TID WC   midodrine   20 mg Oral Q8H   mirtazapine   7.5 mg Oral QHS   multivitamin  1 tablet Oral BID   polyethylene glycol  17 g Oral Daily   QUEtiapine   25 mg Oral QHS   senna-docusate  2 tablet Oral BID   sertraline   25 mg Oral Daily   sodium chloride  flush  10-40 mL Intracatheter Q12H   warfarin  4 mg Oral ONCE-1600   Warfarin - Pharmacist Dosing Inpatient   Does not apply q1600   Continuous:  anticoagulant sodium citrate      PRN:alteplase , anticoagulant sodium citrate , artificial tears, bisacodyl  **OR** bisacodyl , feeding supplement, heparin  sodium (porcine), HYDROmorphone  (DILAUDID ) injection, levalbuterol , lidocaine  (PF), lidocaine -prilocaine , lip balm, melatonin, metoCLOPramide , ondansetron  (ZOFRAN ) IV, mouth rinse, oxyCODONE , pentafluoroprop-tetrafluoroeth, pneumococcal 20-valent conjugate vaccine, sodium chloride , sodium chloride  flush,  traZODone    Objective:  Vital Signs  Vitals:   01/08/24 2335 01/09/24 0346 01/09/24 0753 01/09/24 0824  BP: 99/69 109/71 105/67 90/61  Pulse: 88 80 86 82  Resp: 20 18 (!) 22 19  Temp: 97.9 F (36.6 C) 97.9 F (36.6 C) 98.7 F (37.1 C)   TempSrc: Oral Oral Oral   SpO2: 91% 92% 93% 94%  Weight:      Height:        Intake/Output Summary (Last 24 hours) at 01/09/2024 1046 Last data filed at 01/09/2024 0800 Gross per 24 hour  Intake 240 ml  Output --  Net 240 ml    Filed Weights   01/07/24 0836 01/07/24 1241 01/08/24 0500  Weight: 69.4 kg 68.7 kg 69.1 kg     General: Appearance:     Overweight female in no acute distress     Lungs:     respirations unlabored  Heart:    Normal heart rate. Normal rhythm.    MS:   All extremities are intact.   Neurologic:   Awake, alert     Lab Results:  Data Reviewed: I have personally reviewed following labs and reports of the imaging studies  CBC: Recent Labs  Lab 01/05/24 0500 01/06/24 0445 01/07/24 0546 01/08/24 0640 01/09/24 0350  WBC 7.8 8.0 9.4 9.4 10.4  HGB 9.9* 10.5* 10.8* 11.8* 11.4*  HCT 31.5* 33.3* 33.8* 37.5 35.9*  MCV 99.4 99.4 98.5 100.3* 98.9  PLT 149* 151 177 183 180    Basic Metabolic Panel: Recent Labs  Lab 01/05/24 0500  01/06/24 0445 01/06/24 0500 01/07/24 0546 01/08/24 0640 01/09/24 0350  NA 132*  --  135 133* 135 132*  K 4.3  --  4.0 4.4 4.1 4.3  CL 90*  --  92* 91* 94* 92*  CO2 26  --  27 26 26 25   GLUCOSE 101*  --  102* 102* 108* 110*  BUN 40*  --  21 35* 22 38*  CREATININE 5.23*  --  3.10* 4.79* 3.36* 4.85*  CALCIUM  9.0  --  8.9 9.1 9.4 9.2  MG 2.4 2.3  --  2.3 2.3 2.3  PHOS 6.5*  --  3.3 4.6 3.6 4.0    GFR: Estimated Creatinine Clearance: 9.8 mL/min (A) (by C-G formula based on SCr of 4.85 mg/dL (H)).  Liver Function Tests: Recent Labs  Lab 01/05/24 0500 01/06/24 0500 01/07/24 0546 01/08/24 0640 01/09/24 0350  ALBUMIN  2.8* 2.8* 2.8* 3.0* 3.0*    Coagulation  Profile: Recent Labs  Lab 01/05/24 0500 01/06/24 0445 01/07/24 0546 01/08/24 0640 01/09/24 0350  INR 2.4* 2.2* 2.1* 2.1* 2.1*    CBG: Recent Labs  Lab 01/08/24 0603 01/08/24 1213 01/08/24 1703 01/08/24 2106 01/09/24 0622  GLUCAP 117* 117* 107* 112* 101*     Radiology Studies: No results found.     LOS: 41 days   Vanessa [name] Bowl  [facility] Pager on [email or web address]  01/09/2024, 10:46 AM

## 2024-01-09 NOTE — Progress Notes (Signed)
 Patient ID: Vanessa [name], female   DOB: [birth year], 73 y.o.   MRN: [id number] [facility] Progress Note   Assessment/ Plan:   1. Acute kidney Injury on chronic kidney disease stage IIIa: Appears to be primarily from ischemic ATN in the setting of shock (mixed vasoplegic/cardiogenic).  With dense acute kidney injury from which she remains anuric and dialysis dependent.  While here, continue MWF dialysis schedule-next tomorrow.  She was accepted to a TTS second shift dialysis schedule at [facility] and expresses the desire to have on MWF shift instead; renal navigator has reached out to patient placement to inquire about this. 2.  Severe mitral regurgitation/tricuspid regurgitation: Status post repair with postoperative cardiac tamponade.  Ongoing surveillance by cardiothoracic surgery. 3.  Acute exacerbation of congestive heart failure with reduced ejection fraction: Volume status significantly improved with ongoing hemodialysis/ultrafiltration 4.  Shock: From a combination of cardiogenic and vasoplegic etiology; weaned off of inotropic support and pressors and now on midodrine . 5.  Hyperphosphatemia: Continue phosphorus binder and continue low phosphorus/renal diet.  Subjective:   Without acute events overnight, anticipate some information about alternate dialysis unit placement tomorrow.   Objective:   BP 90/61 (BP Location: Left Arm)   Pulse 82   Temp 98.7 F (37.1 C) (Oral)   Resp 19   Ht 5' 3 (1.6 m)   Wt 69.1 kg   SpO2 94%   BMI 26.99 kg/m   Intake/Output Summary (Last 24 hours) at 01/09/2024 9093 Last data filed at 01/09/2024 0800 Gross per 24 hour  Intake 240 ml  Output --  Net 240 ml   Weight change:   Physical Exam: Gen: Resting comfortably in bed, alert/oriented CVS: Pulse regular rhythm, normal rate, S1 and S2 normal.  Right IJ TDC Resp: Poor respiratory effort with decreased breath sounds over bases, no rales/rhonchi Abd: Soft,  obese, nontender, bowel sounds normal Ext: Trace/1+ lower extremity edema  Imaging: No results found.  Labs: BMET Recent Labs  Lab 01/03/24 0500 01/04/24 0617 01/05/24 0500 01/06/24 0500 01/07/24 0546 01/08/24 0640 01/09/24 0350  NA 133* 134* 132* 135 133* 135 132*  K 5.5* 4.0 4.3 4.0 4.4 4.1 4.3  CL 95* 94* 90* 92* 91* 94* 92*  CO2 24 27 26 27 26 26 25   GLUCOSE 103* 96 101* 102* 102* 108* 110*  BUN 55* 26* 40* 21 35* 22 38*  CREATININE 5.67* 3.54* 5.23* 3.10* 4.79* 3.36* 4.85*  CALCIUM  9.0 8.8* 9.0 8.9 9.1 9.4 9.2  PHOS 9.7* 6.3* 6.5* 3.3 4.6 3.6 4.0   CBC Recent Labs  Lab 01/06/24 0445 01/07/24 0546 01/08/24 0640 01/09/24 0350  WBC 8.0 9.4 9.4 10.4  HGB 10.5* 10.8* 11.8* 11.4*  HCT 33.3* 33.8* 37.5 35.9*  MCV 99.4 98.5 100.3* 98.9  PLT 151 177 183 180    Medications:     amiodarone   200 mg Oral Daily   ascorbic acid   250 mg Oral BID   Chlorhexidine  Gluconate Cloth  6 each Topical Q0600   darbepoetin (ARANESP ) injection - NON-DIALYSIS  100 mcg Subcutaneous Q Tue-1800   Gerhardt's butt cream   Topical BID   insulin  aspart  0-15 Units Subcutaneous TID WC   lanthanum   500 mg Oral TID WC   midodrine   20 mg Oral Q8H   mirtazapine   7.5 mg Oral QHS   multivitamin  1 tablet Oral BID   polyethylene glycol  17 g Oral Daily   QUEtiapine   25 mg Oral QHS  senna-docusate  2 tablet Oral BID   sertraline   25 mg Oral Daily   sodium chloride  flush  10-40 mL Intracatheter Q12H   Warfarin - Pharmacist Dosing Inpatient   Does not apply q1600    Gordy Blanch, MD 01/09/2024, 9:06 AM

## 2024-01-09 NOTE — Progress Notes (Signed)
 Physical Therapy Treatment Patient Details Name: Vanessa [name] MRN: [id number] DOB: May 11, 1951 Today's Date: 01/09/2024   History of Present Illness 73 yo F admitted 12/30 with DOE, increasing MR and TR. [facility] without CAD. 12/30 MVR and tricuspid valve repair. Intubated 1/2-1/5. 1/2 CRRT started/stopped on 1/12. Pt HD line was removed on 1/13 and CRRT was re-started on 1/14-1/20, CRRT restarted on 1/22 and stopped 1/29.  Began intermittent HD on 12/31/23.   PMhx: HTN, NICM, HFrEF (EF40-45%), Pulmonary HTN, CKD and severe MR s/p prior mitral clip x 2 in 2020.    PT Comments  Pt continues to progress with goals. This session attempted sit to stand from EOB with RW in front of pt but pt had significant difficulty maintaining sternal precautions. Pt was provided with demonstration for COM over BOS and attempted again with RW then without RW. Without RW pt was a Min A  with improved body mechanics and less straining. Due to pt current functional status, home set up and available assistance at home recommending skilled physical therapy services > 3 hours/day in order to address strength, balance and functional mobility to decrease risk for falls, injury, immobility, skin break down and re-hospitalization.     If plan is discharge home, recommend the following: Assistance with cooking/housework;Help with stairs or ramp for entrance     Equipment Recommendations  Rolling walker (2 wheels);BSC/3in1       Precautions / Restrictions Precautions Precautions: Fall;Sternal Precaution Booklet Issued: No Precaution Comments: Sternal precautions reviewed verbally with pt demonstrating good carryover of training from prior sessions this day (1/29) Restrictions Weight Bearing Restrictions Per Provider Order: Yes RUE Weight Bearing Per Provider Order: Non weight bearing LUE Weight Bearing Per Provider Order: Non weight bearing Other Position/Activity Restrictions: sternal precautions     Mobility  Bed  Mobility Overal bed mobility: Needs Assistance Bed Mobility: Supine to Sit, Sit to Supine, Rolling Rolling: Min assist   Supine to sit: Min assist Sit to supine: Mod assist   General bed mobility comments: lifting help for trunk and A to scoot hips to EOB. Frequent reminders for sternal precautions.    Transfers Overall transfer level: Needs assistance Equipment used: 1 person hand held assist, Rolling walker (2 wheels) Transfers: Sit to/from Stand Sit to Stand: Mod assist, Min assist           General transfer comment: worked on sit to stand this session 3x sit to stand from EOB with verbal cues and demonstration for correct body mechanics with sit to stand to decrease need for physical assistance. Improvement from mod to Min A without use of UE. Frequent remindes for sternal precautions initially and had RW in front of her. Pt improved when RW was removed from in front of her and pt therapist stood in front of patient to facilitate forward trunk progression.    Ambulation/Gait             Pre-gait activities: took side steps at EOB with RW with good movement bil of LE for stepping toward [facility] and navigation of RW        Balance Overall balance assessment: Needs assistance Sitting-balance support: Single extremity supported, No upper extremity supported, Feet supported Sitting balance-Leahy Scale: Good Sitting balance - Comments: close Supervision for safety with pt sitting EOB but no LOB noted this session   Standing balance support: Single extremity supported, Bilateral upper extremity supported, During functional activity Standing balance-Leahy Scale: Fair Standing balance comment: reliant on external support for balance  Cognition Arousal: Alert Behavior During Therapy: WFL for tasks assessed/performed, Flat affect Overall Cognitive Status: Within Functional Limits for tasks assessed     General Comments: AAOx4 and pleasant throughout session.            General Comments General comments (skin integrity, edema, etc.): Pt continues to require paced activities for shortness of breathe.      Pertinent Vitals/Pain Pain Assessment Pain Assessment: No/denies pain     PT Goals (current goals can now be found in the care plan section) Acute Rehab PT Goals Patient Stated Goal: return home PT Goal Formulation: With patient Time For Goal Achievement: 01/18/24 Potential to Achieve Goals: Good Progress towards PT goals: Progressing toward goals    Frequency    Min 1X/week      PT Plan  Continue with current POC        AM-PAC PT 6 Clicks Mobility   Outcome Measure  Help needed turning from your back to your side while in a flat bed without using bedrails?: A Little Help needed moving from lying on your back to sitting on the side of a flat bed without using bedrails?: A Little Help needed moving to and from a bed to a chair (including a wheelchair)?: A Lot Help needed standing up from a chair using your arms (e.g., wheelchair or bedside chair)?: A Lot Help needed to walk in hospital room?: A Little Help needed climbing 3-5 steps with a railing? : Total 6 Click Score: 14    End of Session Equipment Utilized During Treatment: Gait belt;Oxygen Activity Tolerance: Patient tolerated treatment well Patient left: with call bell/phone within reach;in chair;with bed alarm set Nurse Communication: Mobility status PT Visit Diagnosis: Other abnormalities of gait and mobility (R26.89);Muscle weakness (generalized) (M62.81);Difficulty in walking, not elsewhere classified (R26.2)     Time: 8546-8482 PT Time Calculation (min) (ACUTE ONLY): 24 min  Charges:    $Therapeutic Activity: 23-37 mins PT General Charges $$ ACUTE PT VISIT: 1 Visit                    Dorothyann Maier, DPT, CLT  Acute Rehabilitation Services Office: [phone] (Secure chat preferred)    Dorothyann VEAR Maier 01/09/2024, 4:48 PM

## 2024-01-09 NOTE — Plan of Care (Signed)
  Problem: Education: Goal: Knowledge of General Education information will improve Description: Including pain rating scale, medication(s)/side effects and non-pharmacologic comfort measures Outcome: Progressing   Problem: Clinical Measurements: Goal: Ability to maintain clinical measurements within normal limits will improve Outcome: Progressing   Problem: Clinical Measurements: Goal: Will remain free from infection Outcome: Progressing   Problem: Clinical Measurements: Goal: Respiratory complications will improve Outcome: Progressing   Problem: Clinical Measurements: Goal: Ability to maintain clinical measurements within normal limits will improve Outcome: Progressing   Problem: Clinical Measurements: Goal: Diagnostic test results will improve Outcome: Progressing   Problem: Clinical Measurements: Goal: Will remain free from infection Outcome: Progressing   Problem: Clinical Measurements: Goal: Cardiovascular complication will be avoided Outcome: Progressing

## 2024-01-10 DIAGNOSIS — Z952 Presence of prosthetic heart valve: Secondary | ICD-10-CM | POA: Diagnosis not present

## 2024-01-10 LAB — GLUCOSE, CAPILLARY
Glucose-Capillary: 89 mg/dL (ref 70–99)
Glucose-Capillary: 130 mg/dL — ABNORMAL HIGH (ref 70–99)
Glucose-Capillary: 119 mg/dL — ABNORMAL HIGH (ref 70–99)
Glucose-Capillary: 170 mg/dL — ABNORMAL HIGH (ref 70–99)

## 2024-01-10 LAB — CBC
HCT: 35 % — ABNORMAL LOW (ref 36.0–46.0)
Hemoglobin: 11.1 g/dL — ABNORMAL LOW (ref 12.0–15.0)
MCH: 31.1 pg (ref 26.0–34.0)
MCHC: 31.7 g/dL (ref 30.0–36.0)
MCV: 98 fL (ref 80.0–100.0)
Platelets: 168 10*3/uL (ref 150–400)
RBC: 3.57 MIL/uL — ABNORMAL LOW (ref 3.87–5.11)
RDW: 17.6 % — ABNORMAL HIGH (ref 11.5–15.5)
WBC: 9.6 10*3/uL (ref 4.0–10.5)
nRBC: 0 % (ref 0.0–0.2)

## 2024-01-10 LAB — RENAL FUNCTION PANEL
Albumin: 3 g/dL — ABNORMAL LOW (ref 3.5–5.0)
Anion gap: 20 — ABNORMAL HIGH (ref 5–15)
BUN: 59 mg/dL — ABNORMAL HIGH (ref 8–23)
CO2: 22 mmol/L (ref 22–32)
Calcium: 9.6 mg/dL (ref 8.9–10.3)
Chloride: 91 mmol/L — ABNORMAL LOW (ref 98–111)
Creatinine, Ser: 7.12 mg/dL — ABNORMAL HIGH (ref 0.44–1.00)
GFR, Estimated: 6 mL/min — ABNORMAL LOW (ref >60)
Glucose, Bld: 113 mg/dL — ABNORMAL HIGH (ref 70–99)
Phosphorus: 5.3 mg/dL — ABNORMAL HIGH (ref 2.5–4.6)
Potassium: 4.3 mmol/L (ref 3.5–5.1)
Sodium: 133 mmol/L — ABNORMAL LOW (ref 135–145)

## 2024-01-10 LAB — PROTIME-INR
INR: 2.4 — ABNORMAL HIGH (ref 0.8–1.2)
Prothrombin Time: 26.3 s — ABNORMAL HIGH (ref 11.4–15.2)

## 2024-01-10 LAB — MAGNESIUM: Magnesium: 2.5 mg/dL — ABNORMAL HIGH (ref 1.7–2.4)

## 2024-01-10 MED ORDER — WARFARIN SODIUM 2 MG PO TABS
3.0000 mg | ORAL_TABLET | Freq: Every day | ORAL | Status: DC
  Administered 2024-01-10 – 2024-01-12 (×3): 3 mg via ORAL
  Filled 2024-01-10 (×3): qty 1

## 2024-01-10 MED ORDER — HEPARIN SODIUM (PORCINE) 1000 UNIT/ML DIALYSIS
40.0000 [IU]/kg | INTRAMUSCULAR | Status: DC | PRN
  Administered 2024-01-10: 2800 [IU] via INTRAVENOUS_CENTRAL
  Filled 2024-01-10: qty 3

## 2024-01-10 MED ORDER — MIDODRINE HCL 5 MG PO TABS
10.0000 mg | ORAL_TABLET | Freq: Three times a day (TID) | ORAL | Status: DC
  Administered 2024-01-10: 10 mg via ORAL
  Filled 2024-01-10: qty 2

## 2024-01-10 MED ORDER — MIDODRINE HCL 5 MG PO TABS
20.0000 mg | ORAL_TABLET | Freq: Three times a day (TID) | ORAL | Status: DC
  Administered 2024-01-10 – 2024-01-12 (×7): 20 mg via ORAL
  Filled 2024-01-10 (×7): qty 4

## 2024-01-10 NOTE — Progress Notes (Signed)
 [facility] PROGRESS NOTE   ARLETHA TREVITHICK ZOX:[id number] DOB: 1951-04-14 DOA: 11/29/2023  PCP: Diamond Formica, PA  Brief History: 72yoF with PMH significant for HTN, NICM, HFrEF (EF40-45%), PHTN, CKD and hx of prior severe MR s/p prior mitral clip x 2 in 2020. Pt with progressive DOE found to have increasing MR and moderate TR despite adjustments in GDMT. Not felt to be candidate for additional mitral clip. Repeat LHC without CAD but showed moderate PHTN. Underwent mitral valve replacement with tissue valve and tricuspid valve repair by Dr. Honey Lusty on 12/30.  Hospital course complicated by respiratory failure requiring intubation and ventilator support.  Also developed acute kidney injury.  Consultants: Cardiology.  Cardiothoracic surgery.  Nephrology.  Critical care medicine  Significant Hospital Events:  12/30 MVR by Dr. Honey Lusty; relook for tamponade/washout later in evening without obvious source 1/2 Intubated for respiratory decompensation, iNO started. Vanc, meropenem  started. CRRT started.  1/5 Extubated 1/8 Converted to NSR  1/9 Back to A.fib around 0200 1/11 R introducer out.  Unable to thread right subclavian catheter 1/12 Remains weak still pressor dependent 1/13 Repeat BC sent. Still on pressors, Zosyn  changed to meropenem .  Chest abdomen and pelvis CT obtained : Moderate hemorrhagic pericardial effusion, 5.3 cm hematoma right thrombus. Arterial gas bilateral patchy opacities mild dependent posterior right upper lobe atelectasis was unremarkable layering gallstones; neg for tamponade on repeat Echo 1/14 New right IJ HD catheter placed. Echo obtained to better evaluate her EF is 35 to 40%.  The LV demonstrates regional wall motion abnormalities there is asymmetric left ventricular hypertrophy of the inferior lateral segment RV systolic function moderate regular reduced left atrial wall dilated no mention of clot.  1/15 Still on fairly high-dose norepinephrine  1/16 Cont  CRRT + low dose NE  1/17 RHC confirms low output state, restarted on milrinone  1/20 CRRT stopped 1/21: D/c milrinone  yesterday, ongoing increase NE requirements during nights. Started droxidopa  in addition to midodrine  to aid off NE.  1/22: Levo up to 18 overnight (max so far), cvp 9, restarting crrt today. Received dose methylene blue , Cortisol 21 1/23: Levo down to 9, CVP 8 1/24: Tunneled HD and tunneled dual lumen LIJ CVC placed  1/27: 1U PRBC given for Hgb 7.2 and hypotension. Off levo.  1/29: CRRT stopped. Cortrak clogged, removed. 1/31 tolerated iHD Awaiting placement-- CIR?    Subjective/Interval History: No current complaints   Assessment/Plan:  Postcardiotomy cardiogenic and septic shock She is status post mitral and tricuspid valve replacement. She was in the ICU due to worsening clinical status.  She was intubated.  Subsequently she was extubated.  Stabilized and then transferred to the floor. Seems to be hemodynamically stable currently.  Acute kidney injury/hyperkalemia Followed by nephrology.  It appears that she has progressed to end-stage disease.  She required CRRT in the ICU.   Nephrology is planning dialysis on a Monday Wednesday Friday schedule.    Chronic systolic CHF/pulmonary hypertension/complete heart block EF is 40 to 45%.  Followed by heart failure team.  Low blood pressure has been limiting use of GDMT.  She also has AKI.  Volume status being managed with hemodialysis.  She was on CRRT previously. - midodrine -- decrease dose Complete heart block was transient and has resolved.  Acute respiratory failure with hypoxia -weaned to RA  Mitral regurgitation Status post MitraClip Perinal mitral valve replacement.  Currently on warfarin.  Ventricular ectopy On amiodarone .  Paroxysmal atrial fibrillation/atrial flutter Stable.  Monitor on telemetry.  Noted to be on amiodarone .  Anticoagulated with warfarin.  Macrocytic anemia Required blood  transfusion on 1/27.  Hemoglobin has been stable.  No evidence for overt bleeding.  History of goiter Off of methimazole .  TSH was normal.  Outpatient follow-up with endocrinology.  Hyperglycemia HbA1c 6.  SSI.  Situational depression/sleep disturbance Continue Zoloft , Seroquel , melatonin.  Continue mirtazapine .  Deep tissue injury left buttock Wound care consulted.  Poor oral intake She had a core track in the ICU which had to be removed since it got clogged. -constipation could be contributing-- aggressive bowel regimen   DVT Prophylaxis: On warfarin Code Status: Full code Family Communication: Discussed with patient Disposition Plan: Inpatient rehabilitation is recommended--says sister can come stay with her after rehab     Medications: Scheduled:  amiodarone   200 mg Oral Daily   ascorbic acid   250 mg Oral BID   Chlorhexidine  Gluconate Cloth  6 each Topical Q0600   darbepoetin (ARANESP ) injection - NON-DIALYSIS  100 mcg Subcutaneous Q Tue-1800   Gerhardt's butt cream   Topical BID   insulin  aspart  0-15 Units Subcutaneous TID WC   lanthanum   500 mg Oral TID WC   midodrine   20 mg Oral Q8H   mirtazapine   7.5 mg Oral QHS   multivitamin  1 tablet Oral BID   polyethylene glycol  17 g Oral Daily   QUEtiapine   25 mg Oral QHS   senna-docusate  2 tablet Oral BID   sertraline   25 mg Oral Daily   sodium chloride  flush  10-40 mL Intracatheter Q12H   warfarin  3 mg Oral q1600   Warfarin - Pharmacist Dosing Inpatient   Does not apply q1600   Continuous:  anticoagulant sodium citrate      PRN:alteplase , anticoagulant sodium citrate , artificial tears, bisacodyl  **OR** bisacodyl , feeding supplement, heparin  sodium (porcine), HYDROmorphone  (DILAUDID ) injection, levalbuterol , lidocaine  (PF), lidocaine -prilocaine , lip balm, melatonin, metoCLOPramide , ondansetron  (ZOFRAN ) IV, mouth rinse, oxyCODONE , pentafluoroprop-tetrafluoroeth, pneumococcal 20-valent conjugate vaccine, sodium  chloride, sodium chloride  flush, traZODone    Objective:  Vital Signs  Vitals:   01/09/24 1921 01/09/24 2307 01/10/24 0511 01/10/24 0931  BP: 110/68 100/66 114/70 (!) 144/126  Pulse: 88 83 82 86  Resp: 16 20 20 20   Temp: 98 F (36.7 C) 98 F (36.7 C) 97.6 F (36.4 C) 98 F (36.7 C)  TempSrc: Oral Oral Oral Oral  SpO2: 92% 90% 99% 93%  Weight:   71.6 kg   Height:        Intake/Output Summary (Last 24 hours) at 01/10/2024 1015 Last data filed at 01/09/2024 1700 Gross per 24 hour  Intake 240 ml  Output --  Net 240 ml    Filed Weights   01/07/24 1241 01/08/24 0500 01/10/24 0511  Weight: 68.7 kg 69.1 kg 71.6 kg      General: Appearance:     Overweight female in no acute distress     Lungs:     respirations unlabored  Heart:    Normal heart rate. Normal rhythm.    MS:   All extremities are intact.   Neurologic:   Awake, alert     Lab Results:  Data Reviewed: I have personally reviewed following labs and reports of the imaging studies  CBC: Recent Labs  Lab 01/06/24 0445 01/07/24 0546 01/08/24 0640 01/09/24 0350 01/10/24 0500  WBC 8.0 9.4 9.4 10.4 9.6  HGB 10.5* 10.8* 11.8* 11.4* 11.1*  HCT 33.3* 33.8* 37.5 35.9* 35.0*  MCV 99.4 98.5 100.3* 98.9 98.0  PLT 151 177 183 180 168  Basic Metabolic Panel: Recent Labs  Lab 01/05/24 0500 01/06/24 0445 01/06/24 0500 01/07/24 0546 01/08/24 0640 01/09/24 0350 01/10/24 0500  NA 132*  --  135 133* 135 132*  --   K 4.3  --  4.0 4.4 4.1 4.3  --   CL 90*  --  92* 91* 94* 92*  --   CO2 26  --  27 26 26 25   --   GLUCOSE 101*  --  102* 102* 108* 110*  --   BUN 40*  --  21 35* 22 38*  --   CREATININE 5.23*  --  3.10* 4.79* 3.36* 4.85*  --   CALCIUM  9.0  --  8.9 9.1 9.4 9.2  --   MG 2.4 2.3  --  2.3 2.3 2.3 2.5*  PHOS 6.5*  --  3.3 4.6 3.6 4.0  --     GFR: Estimated Creatinine Clearance: 9.9 mL/min (A) (by C-G formula based on SCr of 4.85 mg/dL (H)).  Liver Function Tests: Recent Labs  Lab 01/05/24 0500  01/06/24 0500 01/07/24 0546 01/08/24 0640 01/09/24 0350  ALBUMIN  2.8* 2.8* 2.8* 3.0* 3.0*    Coagulation Profile: Recent Labs  Lab 01/06/24 0445 01/07/24 0546 01/08/24 0640 01/09/24 0350 01/10/24 0500  INR 2.2* 2.1* 2.1* 2.1* 2.4*    CBG: Recent Labs  Lab 01/09/24 0622 01/09/24 1228 01/09/24 1516 01/09/24 2106 01/10/24 0614  GLUCAP 101* 139* 95 117* 89     Radiology Studies: No results found.     LOS: 42 days   Enrigue Harvard  [facility] Pager on [email or web address]  01/10/2024, 10:15 AM

## 2024-01-10 NOTE — Progress Notes (Signed)
 Contacted [facility] admissions this morning regarding an update on pt's case. Awaiting information from [facility] admissions as to if there is a clinic in [place] with a MWF who can accommodate pt. Will assist as needed.   Lauraine Polite Renal Navigator [phone]

## 2024-01-10 NOTE — Progress Notes (Signed)
 Mobility Specialist Progress Note:    01/10/24 1052  Mobility  Activity Ambulated with assistance in room  Level of Assistance Moderate assist, patient does 50-74%  Assistive Device Front wheel walker  Distance Ambulated (ft) 24 ft  RUE Weight Bearing Per Provider Order NWB  LUE Weight Bearing Per Provider Order NWB  Activity Response Tolerated well  Mobility Referral Yes  Mobility visit 1 Mobility  Mobility Specialist Start Time (ACUTE ONLY) 1040  Mobility Specialist Stop Time (ACUTE ONLY) 1052  Mobility Specialist Time Calculation (min) (ACUTE ONLY) 12 min   Pt received in bed, agreeable to mobility session.ModA to stand, followed sternal precautions. Ambulated in room with RW, CGA throughout ambulation. Returned pt to bed, declined sitting up in chair d/t discomfort. Pt performed LE exercises taught by OT. Pt now lying comfortably in bed, all needs met, call bell in reach, bed alarm on.  Vanessa [name] Mobility Specialist Please contact via Special educational needs teacher or  Rehab office at [phone]

## 2024-01-10 NOTE — Plan of Care (Signed)

## 2024-01-10 NOTE — Progress Notes (Signed)
   01/10/24 1430  During Treatment Monitoring  Intra-Hemodialysis Comments Progressing as prescribed (Dr. Lydia Sams  called back K[facility] and informed of syncopal episode.  Dr. Lydia Sams says lower goal to 1.2L . UF turned back on.)

## 2024-01-10 NOTE — Progress Notes (Signed)
 Patient ID: Vanessa [name], female   DOB: [birth year], 73 y.o.   MRN: [id number] [facility] Progress Note   Assessment/ Plan:   1. Acute kidney Injury on chronic kidney disease stage IIIa: Appears to be primarily from ischemic ATN in the setting of shock (mixed vasoplegic/cardiogenic).  Dense acute kidney injury with dialysis dependency and no evidence of renal recovery yet. On schedule for dialysis today (while here MWF).  She was accepted to a TTS second shift dialysis schedule at [facility] and expresses the desire to have on MWF shift instead; renal navigator has reached out to patient placement to inquire about this. 2.  Severe mitral regurgitation/tricuspid regurgitation: Status post repair with postoperative cardiac tamponade.  Additional management per cardiothoracic surgery. 3.  Acute exacerbation of congestive heart failure with reduced ejection fraction: Volume status significantly improved with ongoing hemodialysis/ultrafiltration 4.  Shock: From a combination of cardiogenic and vasoplegic etiology-currently on midodrine  after previously requiring pressor/inotropic support. 5.  Hyperphosphatemia: Phosphorus level currently at goal for which we will continue phosphorus binder and continue low phosphorus/renal diet.  Subjective:   Denies any acute events overnight, no reported urine output.  Objective:   BP 114/70 (BP Location: Left Arm)   Pulse 82   Temp 97.6 F (36.4 C) (Oral)   Resp 20   Ht 5\' 3"  (1.6 m)   Wt 71.6 kg   SpO2 99%   BMI 27.96 kg/m   Intake/Output Summary (Last 24 hours) at 01/10/2024 9562 Last data filed at 01/09/2024 1700 Gross per 24 hour  Intake 240 ml  Output --  Net 240 ml   Weight change:   Physical Exam: Gen: Sitting up comfortably in bed, watching breakfast, breakfast tray empty. CVS: Pulse regular rhythm, normal rate, S1 and S2 normal.  Right IJ TDC Resp: Clear to auscultation bilaterally, no rales/rhonchi Abd:  Soft, obese, nontender, bowel sounds normal Ext: Trace/1+ lower extremity edema  Imaging: No results found.  Labs: BMET Recent Labs  Lab 01/04/24 0617 01/05/24 0500 01/06/24 0500 01/07/24 0546 01/08/24 0640 01/09/24 0350  NA 134* 132* 135 133* 135 132*  K 4.0 4.3 4.0 4.4 4.1 4.3  CL 94* 90* 92* 91* 94* 92*  CO2 27 26 27 26 26 25   GLUCOSE 96 101* 102* 102* 108* 110*  BUN 26* 40* 21 35* 22 38*  CREATININE 3.54* 5.23* 3.10* 4.79* 3.36* 4.85*  CALCIUM  8.8* 9.0 8.9 9.1 9.4 9.2  PHOS 6.3* 6.5* 3.3 4.6 3.6 4.0   CBC Recent Labs  Lab 01/07/24 0546 01/08/24 0640 01/09/24 0350 01/10/24 0500  WBC 9.4 9.4 10.4 9.6  HGB 10.8* 11.8* 11.4* 11.1*  HCT 33.8* 37.5 35.9* 35.0*  MCV 98.5 100.3* 98.9 98.0  PLT 177 183 180 168    Medications:     amiodarone   200 mg Oral Daily   ascorbic acid   250 mg Oral BID   Chlorhexidine  Gluconate Cloth  6 each Topical Q0600   darbepoetin (ARANESP ) injection - NON-DIALYSIS  100 mcg Subcutaneous Q Tue-1800   Gerhardt's butt cream   Topical BID   insulin  aspart  0-15 Units Subcutaneous TID WC   lanthanum   500 mg Oral TID WC   midodrine   20 mg Oral Q8H   mirtazapine   7.5 mg Oral QHS   multivitamin  1 tablet Oral BID   polyethylene glycol  17 g Oral Daily   QUEtiapine   25 mg Oral QHS   senna-docusate  2 tablet Oral BID   sertraline   25 mg Oral Daily   sodium chloride  flush  10-40 mL Intracatheter Q12H   warfarin  3 mg Oral q1600   Warfarin - Pharmacist Dosing Inpatient   Does not apply q1600    Clevester Dally, MD 01/10/2024, 8:22 AM

## 2024-01-10 NOTE — Progress Notes (Addendum)
 Received patient in bed to unit.  Alert and oriented.  Informed consent signed and in chart.   TX duration:3 hours and 45 minutes  10 minutes into beginning of dialysis session, patient had a syncope.  Patient put on oxygen by nonrebreather until patient came to in a few minutes.  Patient put on 2 Liters Nasal Canula after she stabilized.  Dr. Lydia Sams, nephrologist, informed and he instructed to lower goal to 1.2L.  DO Vann, internist, also notified.  No further instructions from DO Vann. Transported back to the room  Alert, without acute distress.  Hand-off given to patient's nurse.   Access used: R internal jugular HD Cath Access issues: none  Total UF removed: 1L Medication(s) given: midodrine , 200cc boluses of NS for BP support   Luciano Ruths LPN Kidney Dialysis Unit

## 2024-01-10 NOTE — Progress Notes (Signed)
   01/10/24 1418  During Treatment Monitoring  Intra-Hemodialysis Comments See progress note (Patient was nonresponsive, UF turned off, 100cc bolus given, and  Nonrebreather put on patient.  RN Amy, RN Louellen Route, and RN Valarie assisted.  Oxygen saturation more stable and BP back stable (see vitals).  Nikki RN from [facility] response came to evaluate.)

## 2024-01-10 NOTE — Progress Notes (Signed)
   01/10/24 1422  During Treatment Monitoring  Intra-Hemodialysis Comments See progress note Landa Pine RN from [facility] response observed patient to stable.  Dr. Lydia Sams paged.)

## 2024-01-10 NOTE — Progress Notes (Signed)
 Inpatient Rehab Admissions Coordinator:    CIR following. She is not currently ready and I have not yet been able to confirm caregiver support. Will continue to follow.   Wandalee Gust, MS, CCC-SLP Rehab Admissions Coordinator  [phone] (celll) [phone] (office)

## 2024-01-10 NOTE — Progress Notes (Signed)
   01/10/24 1801  Vitals  Temp 98.4 F (36.9 C)  BP 113/81  Pulse Rate 86  Resp (!) 26  Oxygen Therapy  SpO2 97 %  O2 Device Room Air  Patient Activity (if Appropriate) In bed  Pulse Oximetry Type Continuous  During Treatment Monitoring  Duration of HD Treatment -hour(s) 3.75 hour(s)  Cumulative Fluid Removed (mL) per Treatment  1000.12  HD Safety Checks Performed Yes  Intra-Hemodialysis Comments Tx completed;Tolerated well  Dialysis Fluid Bolus Normal Saline  Bolus Amount (mL) 300 mL  Post Treatment  Dialyzer Clearance Clear  Liters Processed 90  Fluid Removed (mL) 1000 mL  Tolerated HD Treatment Yes  Hemodialysis Catheter Right Internal jugular Double lumen Permanent (Tunneled)  Placement Date/Time: 12/24/23 0921   Serial / Lot #: [id number]  Expiration Date: 02/28/28  Time Out: Correct patient;Correct site;Correct procedure  Maximum sterile barrier precautions: Hand hygiene;Cap;Mask;Sterile gown;Sterile gloves;Large sterile s...  Site Condition No complications  Blue Lumen Status Flushed;Dead end cap in place;Heparin  locked  Red Lumen Status Flushed;Heparin  locked;Dead end cap in place  Purple Lumen Status N/A  Catheter fill solution Heparin  1000 units/ml  Catheter fill volume (Arterial) 1.6 cc  Catheter fill volume (Venous) 1.6  Dressing Type Transparent  Dressing Status Antimicrobial disc/dressing in place;Clean, Dry, Intact  Interventions Other (Comment) (deaccessed)  Drainage Description None  Dressing Change Due 01/17/24  Post treatment catheter status Capped and Clamped

## 2024-01-10 NOTE — Progress Notes (Signed)
 PHARMACY - ANTICOAGULATION CONSULT NOTE  Pharmacy Consult for Warfarin Indication: atrial fibrillation  No Known Allergies  Patient Measurements: Height: 5\' 3"  (160 cm) Weight: 71.6 kg (157 lb 13.6 oz) IBW/kg (Calculated) : 52.4 Heparin  Dosing Weight: ~ 70 kg  Vital Signs: Temp: 97.6 F (36.4 C) (02/10 0511) Temp Source: Oral (02/10 0511) BP: 114/70 (02/10 0511) Pulse Rate: 82 (02/10 0511)  Labs: Recent Labs    01/08/24 0640 01/09/24 0350 01/10/24 0500  HGB 11.8* 11.4* 11.1*  HCT 37.5 35.9* 35.0*  PLT 183 180 168  LABPROT 23.6* 24.0* 26.3*  INR 2.1* 2.1* 2.4*  CREATININE 3.36* 4.85*  --    Estimated Creatinine Clearance: 9.9 mL/min (A) (by C-G formula based on SCr of 4.85 mg/dL (H)).  Assessment: 73 yo female s/p scheduled bioprosthetic mitral valve replacement with tissue valve 12/30 now with afib, pharmacy asked to dose anticoagulation with IV heparin . Spontaneously converted 1/7; flipped back to AF overnight 1/9 0300. Baseline INR 1.1, slow uptrend to 1.3 s/p 6 days warfarin therapy, total dose 20 mg, then held 1/21-24 for tunneled access plans. Patient now s/p [facility] and tunneled central line placement 1/24 AM on CRRT, plan HD MWF.  CBC remains low-stable, off norepinephrine  since 1/26, remains on midodrine . Resumed anticoagulation per VIR after tunneled cath placement. Heparin  stopped 1/31 with INR >2.  INR therapeutic again today. Average dose for the past 7d is around 3 so we will try a standing dose today.   Hgb up to 11.1  Goal of Therapy:  INR goal 2 - 3 (bMVR) Monitor platelets by anticoagulation protocol: Yes   Plan:  Coumadin  3mg  qday Daily INR  Ivery Marking, PharmD, BCIDP, AAHIVP, CPP Infectious Disease Pharmacist 01/10/2024 8:14 AM

## 2024-01-11 DIAGNOSIS — N179 Acute kidney failure, unspecified: Secondary | ICD-10-CM | POA: Diagnosis not present

## 2024-01-11 DIAGNOSIS — N183 Chronic kidney disease, stage 3 unspecified: Secondary | ICD-10-CM | POA: Diagnosis not present

## 2024-01-11 DIAGNOSIS — R5381 Other malaise: Secondary | ICD-10-CM | POA: Diagnosis not present

## 2024-01-11 DIAGNOSIS — Z952 Presence of prosthetic heart valve: Secondary | ICD-10-CM | POA: Diagnosis not present

## 2024-01-11 LAB — PROTIME-INR
INR: 2.5 — ABNORMAL HIGH (ref 0.8–1.2)
Prothrombin Time: 26.8 s — ABNORMAL HIGH (ref 11.4–15.2)

## 2024-01-11 LAB — MAGNESIUM: Magnesium: 2.1 mg/dL (ref 1.7–2.4)

## 2024-01-11 LAB — GLUCOSE, CAPILLARY
Glucose-Capillary: 118 mg/dL — ABNORMAL HIGH (ref 70–99)
Glucose-Capillary: 142 mg/dL — ABNORMAL HIGH (ref 70–99)
Glucose-Capillary: 88 mg/dL (ref 70–99)
Glucose-Capillary: 100 mg/dL — ABNORMAL HIGH (ref 70–99)

## 2024-01-11 NOTE — Progress Notes (Signed)
Occupational Therapy Treatment Patient Details Name: Vanessa [name] MRN: [id number] DOB: Sep 08, 1951 Today's Date: 01/11/2024   History of present illness 73 yo F admitted 12/30 with DOE, increasing MR and TR. [facility] without CAD. 12/30 MVR and tricuspid valve repair. Intubated 1/2-1/5. 1/2 CRRT started/stopped on 1/12. Pt HD line was removed on 1/13 and CRRT was re-started on 1/14-1/20, CRRT restarted on 1/22 and stopped 1/29.  Began intermittent HD on 12/31/23.   PMhx: HTN, NICM, HFrEF (EF40-45%), Pulmonary HTN, CKD and severe MR s/p prior mitral clip x 2 in 2020.   OT comments  Pt making gradual progress towards OT goals and eager to regain independence though remains limited by dizziness w/ activity, endurance deficits and sternal precautions. Pt required Mod A for bed mobility, Mod A to stand (+2 helpful from a lower surface) but once up, able to mobilize to/from sink using RW with CGA. Hoped to progress standing tolerance at sink with ADLs though pt reported dizziness (BP WFL). Due to extensive assist needed for LB ADLs, pt may benefit from AE education in next session. Recommend consideration of intensive rehab services at DC.      If plan is discharge home, recommend the following:  Assistance with cooking/housework;Assist for transportation;Help with stairs or ramp for entrance;A lot of help with walking and/or transfers;A lot of help with bathing/dressing/bathroom   Equipment Recommendations  Other (comment) (TBD)    Recommendations for Other Services Rehab consult    Precautions / Restrictions Precautions Precautions: Fall;Sternal Recall of Precautions/Restrictions: Intact Precaution/Restrictions Comments: monitor BP Restrictions Weight Bearing Restrictions Per Provider Order: No       Mobility Bed Mobility Overal bed mobility: Needs Assistance (Simultaneous filing. User may not have seen previous data.) Bed Mobility: Supine to Sit (Simultaneous filing. User may not have seen  previous data.)     Supine to sit: Mod assist, HOB elevated, Used rails     General bed mobility comments: assist to scoot hips to EOB, light Min A to lift trunk (Simultaneous filing. User may not have seen previous data.)    Transfers Overall transfer level: Needs assistance (Simultaneous filing. User may not have seen previous data.) Equipment used: Rolling walker (2 wheels) (Simultaneous filing. User may not have seen previous data.) Transfers: Sit to/from Stand (Simultaneous filing. User may not have seen previous data.) Sit to Stand: Mod assist (Simultaneous filing. User may not have seen previous data.)           General transfer comment: Mod A to stand at bedside with pt placing hands on lap. Mod A x 2 needed to stand from armchair without armrests at sink d/t fatigue and lower surface. cues for exaggerating momentum to stand (Simultaneous filing. User may not have seen previous data.)     Balance Overall balance assessment: Needs assistance Sitting-balance support: No upper extremity supported, Feet supported Sitting balance-Leahy Scale: Good     Standing balance support: During functional activity, Bilateral upper extremity supported Standing balance-Leahy Scale: Poor                             ADL either performed or assessed with clinical judgement   ADL Overall ADL's : Needs assistance/impaired     Grooming: Set up;Sitting;Oral care;Wash/dry face Grooming Details (indicate cue type and reason): pt reported unable to stand at sink for task due to fatigue and dizziness so opted to complete tasks seated at sink  Lower Body Dressing: Maximal assistance;Sit to/from stand;Sitting/lateral leans Lower Body Dressing Details (indicate cue type and reason): unable to reach down to feet for sock mgmt (reports bed too high and typically sits in low chair to manage at home). may benefit from AE education. Total A for compression stocking mgmt              Functional mobility during ADLs: Contact guard assist;Rolling walker (2 wheels)      Extremity/Trunk Assessment Upper Extremity Assessment Upper Extremity Assessment: Generalized weakness;Right hand dominant   Lower Extremity Assessment Lower Extremity Assessment: Defer to PT evaluation        Vision   Vision Assessment?: No apparent visual deficits   Perception     Praxis     Communication Communication Communication: No apparent difficulties (Simultaneous filing. User may not have seen previous data.)   Cognition Arousal: Alert (Simultaneous filing. User may not have seen previous data.) Behavior During Therapy: [facility] for tasks assessed/performed, Flat affect (Simultaneous filing. User may not have seen previous data.) Cognition: No apparent impairments                               Following commands: Intact        Cueing   Cueing Techniques: Verbal cues (Simultaneous filing. User may not have seen previous data.)  Exercises      Shoulder Instructions       General Comments reported dizziness but BP WFL. Noted soft BPs this AM and overnight    Pertinent Vitals/ Pain       Pain Assessment Pain Assessment: No/denies pain (Simultaneous filing. User may not have seen previous data.)  Home Living                                          Prior Functioning/Environment              Frequency  Min 1X/week        Progress Toward Goals  OT Goals(current goals can now be found in the care plan section)  Progress towards OT goals: Progressing toward goals  Acute Rehab OT Goals Patient Stated Goal: be able to regain independence and go home soon OT Goal Formulation: With patient Time For Goal Achievement: 01/25/24 Potential to Achieve Goals: Good  Plan      Co-evaluation                 AM-PAC OT "6 Clicks" Daily Activity     Outcome Measure   Help from another person eating meals?: None Help from  another person taking care of personal grooming?: A Little Help from another person toileting, which includes using toliet, bedpan, or urinal?: A Lot Help from another person bathing (including washing, rinsing, drying)?: A Lot Help from another person to put on and taking off regular upper body clothing?: A Little Help from another person to put on and taking off regular lower body clothing?: A Lot 6 Click Score: 16    End of Session Equipment Utilized During Treatment: Gait belt;Rolling walker (2 wheels)  OT Visit Diagnosis: Muscle weakness (generalized) (M62.81);Other (comment)   Activity Tolerance Patient tolerated treatment well;Patient limited by fatigue   Patient Left in bed;with call bell/phone within reach;Other (comment) (PT and MD present)   Nurse Communication  Time: 8841-6606 OT Time Calculation (min): 37 min  Charges: OT General Charges $OT Visit: 1 Visit OT Treatments $Self Care/Home Management : 23-37 mins  Bradd Canary, OTR/L Acute Rehab Services Office: [phone]   Lorre Munroe 01/11/2024, 11:29 AM

## 2024-01-11 NOTE — Progress Notes (Signed)
PHARMACY - ANTICOAGULATION CONSULT NOTE  Pharmacy Consult for Warfarin Indication: atrial fibrillation  No Known Allergies  Patient Measurements: Height: 5\' 3"  (160 cm) Weight: 68.8 kg (151 lb 10.8 oz) IBW/kg (Calculated) : 52.4 Heparin Dosing Weight: ~ 70 kg  Vital Signs: Temp: 98.2 F (36.8 C) (02/11 0759) Temp Source: Oral (02/11 0759) BP: 95/60 (02/11 0835) Pulse Rate: 78 (02/11 0835)  Labs: Recent Labs    01/09/24 0350 01/10/24 0500 01/10/24 1352 01/11/24 0505  HGB 11.4* 11.1*  --   --   HCT 35.9* 35.0*  --   --   PLT 180 168  --   --   LABPROT 24.0* 26.3*  --  26.8*  INR 2.1* 2.4*  --  2.5*  CREATININE 4.85*  --  7.12*  --    Estimated Creatinine Clearance: 6.7 mL/min (A) (by C-G formula based on SCr of 7.12 mg/dL (H)).  Assessment: 73 yo female s/p scheduled bioprosthetic mitral valve replacement with tissue valve 12/30 now with afib, pharmacy asked to dose anticoagulation with IV heparin. Spontaneously converted 1/7; flipped back to AF overnight 1/9 0300. Baseline INR 1.1, slow uptrend to 1.3 s/p 6 days warfarin therapy, total dose 20 mg, then held 1/21-24 for tunneled access plans. Patient now s/p [facility] and tunneled central line placement 1/24 AM on CRRT, plan HD MWF.  CBC remains low-stable, off norepinephrine since 1/26, remains on midodrine. Resumed anticoagulation per VIR after tunneled cath placement. Heparin stopped 1/31 with INR >2.  INR seems to be stable. Average dose for the past 7d is around 3. Continue current dose and reduce INR checks.  Hgb up to 11.1  Goal of Therapy:  INR goal 2 - 3 (bMVR) Monitor platelets by anticoagulation protocol: Yes   Plan:  Coumadin 3mg  qday MWF INR  Ulyses Southward, PharmD, BCIDP, AAHIVP, CPP Infectious Disease Pharmacist 01/11/2024 9:28 AM

## 2024-01-11 NOTE — Progress Notes (Signed)
[facility] PROGRESS NOTE   DEVI HOPMAN WGN:[id number] DOB: 16-Aug-1951 DOA: 11/29/2023  PCP: Milus Height, PA  Brief History: 72yoF with PMH significant for HTN, NICM, HFrEF (EF40-45%), PHTN, CKD and hx of prior severe MR s/p prior mitral clip x 2 in 2020. Pt with progressive DOE found to have increasing MR and moderate TR despite adjustments in GDMT. Not felt to be candidate for additional mitral clip. Repeat LHC without CAD but showed moderate PHTN. Underwent mitral valve replacement with tissue valve and tricuspid valve repair by Dr. Leafy Ro on 12/30.  Hospital course complicated by respiratory failure requiring intubation and ventilator support.  Also developed acute kidney injury.  Consultants: Cardiology.  Cardiothoracic surgery.  Nephrology.  Critical care medicine  Significant Hospital Events:  12/30 MVR by Dr. Leafy Ro; relook for tamponade/washout later in evening without obvious source 1/2 Intubated for respiratory decompensation, iNO started. Vanc, meropenem started. CRRT started.  1/5 Extubated 1/8 Converted to NSR  1/9 Back to A.fib around 0200 1/11 R introducer out.  Unable to thread right subclavian catheter 1/12 Remains weak still pressor dependent 1/13 Repeat BC sent. Still on pressors, Zosyn changed to meropenem.  Chest abdomen and pelvis CT obtained : Moderate hemorrhagic pericardial effusion, 5.3 cm hematoma right thrombus. Arterial gas bilateral patchy opacities mild dependent posterior right upper lobe atelectasis was unremarkable layering gallstones; neg for tamponade on repeat Echo 1/14 New right IJ HD catheter placed. Echo obtained to better evaluate her EF is 35 to 40%.  The LV demonstrates regional wall motion abnormalities there is asymmetric left ventricular hypertrophy of the inferior lateral segment RV systolic function moderate regular reduced left atrial wall dilated no mention of clot.  1/15 Still on fairly high-dose norepinephrine 1/16 Cont  CRRT + low dose NE  1/17 RHC confirms low output state, restarted on milrinone 1/20 CRRT stopped 1/21: D/c milrinone yesterday, ongoing increase NE requirements during nights. Started droxidopa in addition to midodrine to aid off NE.  1/22: Levo up to 18 overnight (max so far), cvp 9, restarting crrt today. Received dose methylene blue, Cortisol 21 1/23: Levo down to 9, CVP 8 1/24: Tunneled HD and tunneled dual lumen LIJ CVC placed  1/27: 1U PRBC given for Hgb 7.2 and hypotension. Off levo.  1/29: CRRT stopped. Cortrak clogged, removed. 1/31 tolerated iHD Awaiting placement-- CIR?    Subjective/Interval History: No BM yet today   Assessment/Plan:  Postcardiotomy cardiogenic and septic shock She is status post mitral and tricuspid valve replacement. She was in the ICU due to worsening clinical status.  She was intubated.  Subsequently she was extubated.  Stabilized and then transferred to the floor. Seems to be hemodynamically stable currently.  Acute kidney injury/hyperkalemia Followed by nephrology.  It appears that she has progressed to end-stage disease.  She required CRRT in the ICU.   Nephrology is planning dialysis on a Monday Wednesday Friday schedule.    Chronic systolic CHF/pulmonary hypertension/complete heart block EF is 40 to 45%.  Followed by heart failure team.  Low blood pressure has been limiting use of GDMT.  She also has AKI.  Volume status being managed with hemodialysis.  She was on CRRT previously. - midodrine Complete heart block was transient and has resolved.  Acute respiratory failure with hypoxia -weaned to RA  Mitral regurgitation Status post MitraClip Perinal mitral valve replacement.  Currently on warfarin.  Ventricular ectopy On amiodarone.  Paroxysmal atrial fibrillation/atrial flutter Stable.  Monitor on telemetry.  Noted to be on amiodarone. Anticoagulated  with warfarin.  Macrocytic anemia Required blood transfusion on 1/27.  Hemoglobin  has been stable.  No evidence for overt bleeding.  History of goiter Off of methimazole.  TSH was normal.  Outpatient follow-up with endocrinology.  Hyperglycemia HbA1c 6.  SSI.  Situational depression/sleep disturbance Continue Zoloft, Seroquel, melatonin.  Continue mirtazapine.  Deep tissue injury left buttock Wound care consulted.  Poor oral intake She had a core track in the ICU which had to be removed since it got clogged. -constipation could be contributing-- aggressive bowel regimen   DVT Prophylaxis: On warfarin Code Status: Full code Family Communication: Discussed with patient Disposition Plan: Inpatient rehabilitation is recommended--says sister can come stay with her after rehab     Medications: Scheduled:  amiodarone  200 mg Oral Daily   ascorbic acid  250 mg Oral BID   Chlorhexidine Gluconate Cloth  6 each Topical Q0600   darbepoetin (ARANESP) injection - NON-DIALYSIS  100 mcg Subcutaneous Q Tue-1800   Gerhardt's butt cream   Topical BID   insulin aspart  0-15 Units Subcutaneous TID WC   lanthanum  500 mg Oral TID WC   midodrine  20 mg Oral TID WC   mirtazapine  7.5 mg Oral QHS   multivitamin  1 tablet Oral BID   polyethylene glycol  17 g Oral Daily   QUEtiapine  25 mg Oral QHS   senna-docusate  2 tablet Oral BID   sertraline  25 mg Oral Daily   sodium chloride flush  10-40 mL Intracatheter Q12H   warfarin  3 mg Oral q1600   Warfarin - Pharmacist Dosing Inpatient   Does not apply q1600   Continuous:  anticoagulant sodium citrate     ZOX:WRUEAVWUJ, anticoagulant sodium citrate, artificial tears, bisacodyl **OR** bisacodyl, feeding supplement, heparin sodium (porcine), HYDROmorphone (DILAUDID) injection, levalbuterol, lidocaine (PF), lidocaine-prilocaine, lip balm, melatonin, metoCLOPramide, ondansetron (ZOFRAN) IV, mouth rinse, oxyCODONE, pentafluoroprop-tetrafluoroeth, pneumococcal 20-valent conjugate vaccine, sodium chloride, sodium chloride flush,  traZODone   Objective:  Vital Signs  Vitals:   01/11/24 0544 01/11/24 0759 01/11/24 0835 01/11/24 1128  BP:  (!) 92/59 95/60 99/61   Pulse: 76  78 83  Resp: 20 16 19 20   Temp:  98.2 F (36.8 C)  98.4 F (36.9 C)  TempSrc:  Oral  Oral  SpO2: 96%  97% 98%  Weight: 68.8 kg     Height:        Intake/Output Summary (Last 24 hours) at 01/11/2024 1408 Last data filed at 01/11/2024 1305 Gross per 24 hour  Intake 800 ml  Output 2000 ml  Net -1200 ml    Filed Weights   01/10/24 1354 01/10/24 1813 01/11/24 0544  Weight: 69.7 kg 68.7 kg 68.8 kg      General: Appearance:     Overweight female in no acute distress     Lungs:     respirations unlabored  Heart:    Normal heart rate. Normal rhythm.    MS:   All extremities are intact.   Neurologic:   Awake, alert     Lab Results:  Data Reviewed: I have personally reviewed following labs and reports of the imaging studies  CBC: Recent Labs  Lab 01/06/24 0445 01/07/24 0546 01/08/24 0640 01/09/24 0350 01/10/24 0500  WBC 8.0 9.4 9.4 10.4 9.6  HGB 10.5* 10.8* 11.8* 11.4* 11.1*  HCT 33.3* 33.8* 37.5 35.9* 35.0*  MCV 99.4 98.5 100.3* 98.9 98.0  PLT 151 177 183 180 168    Basic Metabolic Panel: Recent Labs  Lab 01/06/24 0500 01/07/24 0546 01/08/24 0640 01/09/24 0350 01/10/24 0500 01/10/24 1352 01/11/24 0505  NA 135 133* 135 132*  --  133*  --   K 4.0 4.4 4.1 4.3  --  4.3  --   CL 92* 91* 94* 92*  --  91*  --   CO2 27 26 26 25   --  22  --   GLUCOSE 102* 102* 108* 110*  --  113*  --   BUN 21 35* 22 38*  --  59*  --   CREATININE 3.10* 4.79* 3.36* 4.85*  --  7.12*  --   CALCIUM 8.9 9.1 9.4 9.2  --  9.6  --   MG  --  2.3 2.3 2.3 2.5*  --  2.1  PHOS 3.3 4.6 3.6 4.0  --  5.3*  --     GFR: Estimated Creatinine Clearance: 6.7 mL/min (A) (by C-G formula based on SCr of 7.12 mg/dL (H)).  Liver Function Tests: Recent Labs  Lab 01/06/24 0500 01/07/24 0546 01/08/24 0640 01/09/24 0350 01/10/24 1352  ALBUMIN 2.8*  2.8* 3.0* 3.0* 3.0*    Coagulation Profile: Recent Labs  Lab 01/07/24 0546 01/08/24 0640 01/09/24 0350 01/10/24 0500 01/11/24 0505  INR 2.1* 2.1* 2.1* 2.4* 2.5*    CBG: Recent Labs  Lab 01/10/24 1252 01/10/24 1420 01/10/24 2143 01/11/24 0617 01/11/24 1131  GLUCAP 130* 119* 170* 118* 142*     Radiology Studies: No results found.     LOS: 43 days   Joseph Art  [facility] Pager on [email or web address]  01/11/2024, 2:08 PM

## 2024-01-11 NOTE — Progress Notes (Signed)
Physical Therapy Treatment Patient Details Name: Vanessa [name] MRN: [id number] DOB: 01/01/1951 Today's Date: 01/11/2024   History of Present Illness 73 yo F admitted 12/30 with DOE, increasing MR and TR. LCH without CAD. 12/30 MVR and tricuspid valve repair. Intubated 1/2-1/5. 1/2 CRRT started/stopped on 1/12. Pt HD line was removed on 1/13 and CRRT was re-started on 1/14-1/20, CRRT restarted on 1/22 and stopped 1/29.  Began intermittent HD on 12/31/23.   PMhx: HTN, NICM, HFrEF (EF40-45%), Pulmonary HTN, CKD and severe MR s/p prior mitral clip x 2 in 2020.    PT Comments  Patient received sitting at sink with OT present in room. Patient is fatigued from grooming tasks. She requires mod A +1-2 for sit to stand from straight back chair. Cues given to scoot forward in chair, but she is unable to do so. Patient then ambulated 12 feet from sink back to bed. She performed STS x 2 reps min A from elevated surface and then asked to be done.  Patient will continue to benefit from skilled PT to improve strength, endurance and independence with mobility.    If plan is discharge home, recommend the following: Assistance with cooking/housework;Help with stairs or ramp for entrance;A lot of help with walking and/or transfers;A lot of help with bathing/dressing/bathroom   Can travel by private vehicle      no  Equipment Recommendations  Rolling walker (2 wheels);BSC/3in1    Recommendations for Other Services Rehab consult     Precautions / Restrictions Precautions Precautions: Fall;Sternal Precaution/Restrictions Comments: Sternal precautions reviewed verbally with pt demonstrating good carryover of training Restrictions Weight Bearing Restrictions Per Provider Order: Yes Other Position/Activity Restrictions: sternal precautions     Mobility  Bed Mobility     Rolling: Min assist       Sit to sidelying: Min assist      Transfers                         Ambulation/Gait Ambulation/Gait assistance: Min assist Gait Distance (Feet): 12 Feet Assistive device: Rolling walker (2 wheels) Gait Pattern/deviations: Step-through pattern, Decreased step length - right, Decreased step length - left, Decreased stride length Gait velocity: decreased     General Gait Details: ambulated from sink back to bed   Stairs             Wheelchair Mobility     Tilt Bed    Modified Rankin (Stroke Patients Only)       Balance Overall balance assessment: Needs assistance Sitting-balance support: Feet supported Sitting balance-Leahy Scale: Normal     Standing balance support: During functional activity, Bilateral upper extremity supported, Reliant on assistive device for balance Standing balance-Leahy Scale: Fair Standing balance comment: reliant on external support for balance                            Communication Communication Communication: No apparent difficulties (Simultaneous filing. User may not have seen previous data.)  Cognition       PT - Cognitive impairments: No apparent impairments                                Cueing    Exercises      General Comments General comments (skin integrity, edema, etc.): reported dizziness but BP WFL. Noted soft BPs this AM and overnight      Pertinent  Vitals/Pain      Home Living                          Prior Function            PT Goals (current goals can now be found in the care plan section) Acute Rehab PT Goals Patient Stated Goal: return home PT Goal Formulation: With patient Time For Goal Achievement: 01/18/24 Potential to Achieve Goals: Fair Progress towards PT goals: Progressing toward goals    Frequency    Min 1X/week      PT Plan      Co-evaluation              AM-PAC PT "6 Clicks" Mobility   Outcome Measure  Help needed turning from your back to your side while in a flat bed without using bedrails?: A  Little Help needed moving from lying on your back to sitting on the side of a flat bed without using bedrails?: A Little Help needed moving to and from a bed to a chair (including a wheelchair)?: A Lot Help needed standing up from a chair using your arms (e.g., wheelchair or bedside chair)?: A Lot Help needed to walk in hospital room?: Total Help needed climbing 3-5 steps with a railing? : Total 6 Click Score: 12    End of Session Equipment Utilized During Treatment: Gait belt Activity Tolerance: Patient limited by fatigue Patient left: in bed;with call bell/phone within reach;with bed alarm set Nurse Communication: Mobility status PT Visit Diagnosis: Other abnormalities of gait and mobility (R26.89);Muscle weakness (generalized) (M62.81);Difficulty in walking, not elsewhere classified (R26.2)     Time: 0950-1000 PT Time Calculation (min) (ACUTE ONLY): 10 min  Charges:    $Therapeutic Activity: 8-22 mins PT General Charges $$ ACUTE PT VISIT: 1 Visit                     Mariaclara Spear, PT, GCS 01/11/24,11:31 AM

## 2024-01-11 NOTE — Progress Notes (Signed)
Nutrition Follow-up  DOCUMENTATION CODES:   Not applicable  INTERVENTION:  Continue Regular diet as ordered; room service with assist Continue Magic cup TID with meals, each supplement provides 290 kcal and 9 grams of protein Continue Renal MVI, Vit C   NUTRITION DIAGNOSIS:   Inadequate oral intake related to acute illness as evidenced by NPO status. - progressing  GOAL:   Patient will meet greater than or equal to 90% of their needs - addressing via meals and nutrition supplements  MONITOR:   PO intake, Supplement acceptance, TF tolerance, Labs, Weight trends  REASON FOR ASSESSMENT:   Consult Enteral/tube feeding initiation and management, Assessment of nutrition requirement/status (CRRT)  ASSESSMENT:   73 yo admitted with severe MR/TR requiring MVR/TVR complicated by post op bleeding and shock. Pt intubated and CRRT initiated. PMH includes HTN, NICM, HFrEF (EF40-50%), HTN, CKD and hx of prior severe MR s/p mitral clip x 2 in 2020.  12/30 Admitted, severe MR, moderate TR, OR for MVR with mosaic porcine tissue prosthesis, TVR, post op cardiogenic shock, post op bleeding requiring return to OR for washout 12/31 Extubated 01/02 Re-Intubated, iNO started, CRRT initiated 01/03 Cortrak, trickle TF 01/09 US abdomen with dilated GB, no stones/sludge 01/05 Extubated 01/12 CRRT discontinued, diet advanced to 2g sodium 01/14 CRRT resumed 01/17 Transitioned to nocturnal TF to see if po improves, Calorie Count. RHC with low cardiac output, restarted on milrinone 01/20 CRRT stopped, Calorie Count revealed pt eating minimally (7% calorie needs, 2% protein needs), not taking supplements 01/22 Full TF resumed 01/29 Cortrak clogged, removed and not replaced 01/31 Tolerated iHD with 3L net UF  02/02 Transferred out of ICU to 4E   Awaiting possible CIR placement.    Spoke with pt at bedside. She reports that she is eating until she feels satisfied. This morning she had a sausage  biscuit and some magic cup but not 100%. Pt reports that she did not eat lunch yesterday as she did not want to feel full during dialysis. She continues to consume light meals after HD as previously recommended. Will continue with current nutrition interventions.   Meal completions: 2/9: 50% breakfast, 0% lunch, 10% dinner 2/11: 80% breakfast, 20% lunch  Last HD session 2/10. Pt with syncopal episode following initiation of session. UF goal lowered.  Net UF 1L  First measured weight (12/31): 93.8 kg Current weight: 68.7 kg (post HD 2/10)  Medications: Vitamin C 250mg  BID, SSI 0-15 units TID, forsrenol, rena-vit, senna BID, warfarin  Labs:  Sodium 133 Potassium 4.3 (wdl) BUN 59 Cr 7.12 Anion gap 20 Phos 5.3  GFR 6 CBG's 89-170 x24 hours  Diet Order:   Diet Order             Diet regular Room service appropriate? Yes; Fluid consistency: Thin  Diet effective now                   EDUCATION NEEDS:   Not appropriate for education at this time  Skin:  Skin Assessment: Skin Integrity Issues: Skin Integrity Issues:: DTI DTI: L Buttocks Incisions: chest post sternotomy (closed)  Last BM:  2/10 type 3  Height:   Ht Readings from Last 1 Encounters:  12/26/23 5\' 3"  (1.6 m)    Weight:   Wt Readings from Last 1 Encounters:  01/11/24 68.8 kg   BMI:  Body mass index is 26.87 kg/m.  Estimated Nutritional Needs:   Kcal:  1600-1800 kcals  Protein:  85-105 g  Fluid:  1L plus  UOP  Drusilla Kanner, RDN, LDN Clinical Nutrition

## 2024-01-11 NOTE — Progress Notes (Signed)
Contacted [facility] admissions and local clinic to request an update on pt's request for a MWF appt at d/c. Awaiting a response. Will assist as needed.   Olivia Canter Renal Navigator [phone]

## 2024-01-11 NOTE — Consult Note (Signed)
Physical Medicine and Rehabilitation Consult Reason for Consult: Debility after multiple medical issues Referring Physician: Benjamine Mola   HPI: Vanessa [name] is a 72 y.o. female with a history of hypertension, nonischemic cardiomyopathy, CKD, severe mitral regurgitation.  Patient failed more conservative measures and ultimately underwent mitral valve replacement with tissue valve and tricuspid valve repair by Dr. Leafy Ro on 11/29/2023.  Hospital course was complicated by acute respiratory failure requiring intubation and acute on chronic kidney injury as well as cardiac tamponade and cardiogenic shock.  Patient currently is on dialysis Monday Wednesday and Friday.  She will require dialysis after discharge.  Patient initially on tube feedings but now has been advanced to a regular diet although p.o. intake has been poor.Marland Kitchen  She was up with therapy on Sunday and was mod assist to min assist for sit to stand transfers.  Patient needed reminders for sternal precautions as well as correct mechanics.  She has done some pre-gait activities with some steps at the edge of bed only.  Prior to this hospitalization patient lives alone in a 1 level house with level entry.  She was modified independent.  It appears she has family who can provide some support after discharge.   Review of Systems  Constitutional:  Negative for fever.  HENT: Negative.    Eyes: Negative.   Respiratory:  Positive for cough and shortness of breath.   Cardiovascular:  Positive for chest pain and leg swelling.  Gastrointestinal: Negative.   Genitourinary: Negative.   Musculoskeletal:  Positive for joint pain.  Skin:  Negative for rash.  Neurological:  Positive for weakness.  Psychiatric/Behavioral: Negative.     Past Medical History:  Diagnosis Date   CHF (congestive heart failure) (HCC)    CKD (chronic kidney disease)    Heart murmur    Hypertension    Moderate mitral regurgitation    moderate to severe MR with moderate  pulmonary HTN   Multinodular goiter    Nonischemic cardiomyopathy (HCC)    EF 30-35% by echo 2015   Pulmonary HTN (HCC)    moderate with PASP by echo 2015   PVC's (premature ventricular contractions)    Past Surgical History:  Procedure Laterality Date   CARDIAC CATHETERIZATION  2003   normal coronary arteries   CARDIAC CATHETERIZATION  2013  Nov   h/o nonischemic DCM EF 35-40% increase LVEDP    EXPLORATION POST OPERATIVE OPEN HEART N/A 11/29/2023   Procedure: EXPLORATION POST OPERATIVE OPEN HEART;  Surgeon: Eugenio Hoes, MD;  Location: [facility] OR;  Service: Open Heart Surgery;  Laterality: N/A;   HYSTEROSCOPY WITH D & C  12/02/2012   Procedure: DILATATION AND CURETTAGE /HYSTEROSCOPY;  Surgeon: Dorien Chihuahua. Richardson Dopp, MD;  Location: [facility] ORS;  Service: Gynecology;  Laterality: N/A;  cervical polypectomy   IR FLUORO GUIDE CV LINE LEFT  12/24/2023   IR FLUORO GUIDE CV LINE RIGHT  12/24/2023   IR US GUIDE VASC ACCESS LEFT  12/24/2023   IR US GUIDE VASC ACCESS RIGHT  12/24/2023   LEFT AND RIGHT HEART CATHETERIZATION WITH CORONARY ANGIOGRAM N/A 02/15/2015   Procedure: LEFT AND RIGHT HEART CATHETERIZATION WITH CORONARY ANGIOGRAM;  Surgeon: Corky Crafts, MD;  Location: [facility] CATH LAB;  Service: Cardiovascular;  Laterality: N/A;   MITRAL VALVE REPLACEMENT N/A 11/29/2023   Procedure: MITRAL VALVE (MV) REPLACEMENT USING MOSAIC 310CINCH TISSUE VALVE;  Surgeon: Eugenio Hoes, MD;  Location: [facility] OR;  Service: Open Heart Surgery;  Laterality: N/A;   RIGHT  HEART CATH N/A 12/17/2023   Procedure: RIGHT HEART CATH;  Surgeon: Laurey Morale, MD;  Location: [facility] INVASIVE CV LAB;  Service: Cardiovascular;  Laterality: N/A;   RIGHT/LEFT HEART CATH AND CORONARY ANGIOGRAPHY N/A 10/12/2023   Procedure: RIGHT/LEFT HEART CATH AND CORONARY ANGIOGRAPHY;  Surgeon: Laurey Morale, MD;  Location: [facility] INVASIVE CV LAB;  Service: Cardiovascular;  Laterality: N/A;   TEE WITHOUT CARDIOVERSION N/A 10/11/2018   Procedure:  TRANSESOPHAGEAL ECHOCARDIOGRAM (TEE);  Surgeon: Laurey Morale, MD;  Location: [facility] ENDOSCOPY;  Service: Cardiovascular;  Laterality: N/A;   TEE WITHOUT CARDIOVERSION N/A 09/07/2023   Procedure: TRANSESOPHAGEAL ECHOCARDIOGRAM;  Surgeon: Laurey Morale, MD;  Location: [facility] INVASIVE CV LAB;  Service: Cardiovascular;  Laterality: N/A;   TEE WITHOUT CARDIOVERSION N/A 11/29/2023   Procedure: TRANSESOPHAGEAL ECHOCARDIOGRAM;  Surgeon: Eugenio Hoes, MD;  Location: [facility] OR;  Service: Open Heart Surgery;  Laterality: N/A;   TRANSCATHETER MITRAL EDGE TO EDGE REPAIR N/A 12/14/2018   Procedure: MITRAL VALVE REPAIR;  Surgeon: Tonny Bollman, MD;  Location: [facility] INVASIVE CV LAB;  Service: Cardiovascular;  Laterality: N/A;   TRICUSPID VALVE REPLACEMENT N/A 11/29/2023   Procedure: TRICUSPID VALVE REPAIR USING MC3 EDWARDS TRICUSPID ANNULOPLASTY RING;  Surgeon: Eugenio Hoes, MD;  Location: [facility] OR;  Service: Open Heart Surgery;  Laterality: N/A;   Family History  Problem Relation Age of Onset   Breast cancer Mother 21   Heart disease Father    Breast cancer Sister 57   Colon cancer Neg Hx    Esophageal cancer Neg Hx    Rectal cancer Neg Hx    Stomach cancer Neg Hx    Thyroid disease Neg Hx    Social History:  reports that she has never smoked. She has never used smokeless tobacco. She reports that she does not drink alcohol and does not use drugs. Allergies: No Known Allergies Medications Prior to Admission  Medication Sig Dispense Refill   aspirin 81 MG EC tablet Take 1 tablet (81 mg total) by mouth daily.     Calcium Carb-Cholecalciferol (CALCIUM + VITAMIN D3 PO) Take 1 tablet by mouth in the morning.     carvedilol (COREG) 6.25 MG tablet Take 1 tablet by mouth twice daily 180 tablet 0   empagliflozin (JARDIANCE) 10 MG TABS tablet TAKE 1 TABLET BY MOUTH ONCE DAILY BEFORE BREAKFAST 90 tablet 3   ibandronate (BONIVA) 150 MG tablet Take 150 mg by mouth every 30 (thirty) days.     torsemide (DEMADEX) 20 MG  tablet TAKE 2 TABLETS BY MOUTH ONCE DAILY ALTERNATING WITH TAKING 1 TABLET THE NEXT DAY 90 tablet 0   Vericiguat (VERQUVO) 2.5 MG TABS Take 1 tablet (2.5 mg total) by mouth daily. 30 tablet 11    Home: Home Living Family/patient expects to be discharged to:: Private residence Living Arrangements: Alone Available Help at Discharge: Family, Available 24 hours/day Type of Home: House Home Access: Level entry Home Layout: One level Bathroom Shower/Tub: Armed forces operational officer Accessibility: Yes Home Equipment: None Additional Comments: mom will be able to assist at D/C  Lives With: Other (Comment)  Functional History: Prior Function Prior Level of Function : Independent/Modified Independent, Driving Functional Status:  Mobility: Bed Mobility Overal bed mobility: Needs Assistance Bed Mobility: Supine to Sit, Sit to Supine, Rolling Rolling: Min assist Sidelying to sit: Mod assist Supine to sit: Min assist Sit to supine: Mod assist Sit to sidelying: Mod assist General bed mobility comments: lifting help for trunk and A to scoot  hips to EOB. Frequent reminders for sternal precautions. Transfers Overall transfer level: Needs assistance Equipment used: 1 person hand held assist, Rolling walker (2 wheels) Transfers: Sit to/from Stand Sit to Stand: Mod assist, Min assist Bed to/from chair/wheelchair/BSC transfer type:: Step pivot Stand pivot transfers: Min assist, +2 safety/equipment, +2 physical assistance Step pivot transfers: Min assist, Mod assist  Lateral/Scoot Transfers: Mod assist, Max assist General transfer comment: worked on sit to stand this session 3x sit to stand from EOB with verbal cues and demonstration for correct body mechanics with sit to stand to decrease need for physical assistance. Improvement from mod to Min A without use of UE. Frequent remindes for sternal precautions initially and had RW in front of her. Pt improved when RW was  removed from in front of her and pt therapist stood in front of patient to facilitate forward trunk progression. Ambulation/Gait Ambulation/Gait assistance: +2 safety/equipment, Min assist Gait Distance (Feet): 30 Feet Assistive device: Rolling walker (2 wheels) Gait Pattern/deviations: Step-to pattern, Step-through pattern, Decreased stride length, Shuffle, Trunk flexed General Gait Details: did not progress due to fatigue after practicing sit to stand. Gait velocity: decreased Gait velocity interpretation: <1.31 ft/sec, indicative of household ambulator Pre-gait activities: took side steps at EOB with RW with good movement bil of LE for stepping toward [facility] and navigation of RW    ADL: ADL Overall ADL's : Needs assistance/impaired Eating/Feeding: Set up, Sitting Grooming: Set up, Sitting Grooming Details (indicate cue type and reason): Initially agreeable for ambulation to sink with encouragement but limited by low BPs Upper Body Bathing: Minimal assistance, Sitting, Cueing for compensatory techniques (adhering to sternal precautions) Upper Body Bathing Details (indicate cue type and reason): simulated sitting EOB Lower Body Bathing: Total assistance Upper Body Dressing : Contact guard assist, Sitting, Cueing for compensatory techniques (adhering to sternal precautions) Lower Body Dressing: Maximal assistance, Sit to/from stand, Sitting/lateral leans, +2 for safety/equipment Lower Body Dressing Details (indicate cue type and reason): socks Toilet Transfer: Minimal assistance, Moderate assistance, BSC/3in1 (hand held assist +1; step-pivot) Toilet Transfer Details (indicate cue type and reason): Mods assist to power up then Min assist once in standing Toileting- Clothing Manipulation and Hygiene: Maximal assistance, +2 for safety/equipment, Sit to/from stand Toileting - Clothing Manipulation Details (indicate cue type and reason): pericare at bed level Functional mobility during ADLs:  Minimal assistance (EVA; STS and marching in place this session) General ADL Comments: pt continues to present with decreased activity tolerance, but with noted improvements in activity tolerance as compared to last skilled OT session  Cognition: Cognition Overall Cognitive Status: Within Functional Limits for tasks assessed Orientation Level: Oriented X4 Cognition Arousal: Alert Behavior During Therapy: WFL for tasks assessed/performed, Flat affect Overall Cognitive Status: Within Functional Limits for tasks assessed Area of Impairment: Problem solving, Awareness, Safety/judgement, Attention Current Attention Level: Selective Memory: Decreased recall of precautions Safety/Judgement: Decreased awareness of safety, Decreased awareness of deficits Awareness: Emergent Problem Solving: Slow processing General Comments: AAOx4 and pleasant throughout session.  Blood pressure 95/60, pulse 78, temperature 98.2 F (36.8 C), temperature source Oral, resp. rate 19, height 5\' 3"  (1.6 m), weight 68.8 kg, SpO2 97%. Physical Exam HENT:     Head: Normocephalic.     Right Ear: External ear normal.     Left Ear: External ear normal.     Nose: Nose normal.     Mouth/Throat:     Mouth: Mucous membranes are moist.  Eyes:     Extraocular Movements: Extraocular movements intact.  Pupils: Pupils are equal, round, and reactive to light.  Cardiovascular:     Rate and Rhythm: Normal rate.  Pulmonary:     Effort: Pulmonary effort is normal.  Abdominal:     Palpations: Abdomen is soft.  Musculoskeletal:     Cervical back: Normal range of motion.  Skin:    Comments: Sternal incision CDI  Neurological:     Mental Status: She is alert.     Comments: Alert and oriented x 3. Normal insight and awareness. Intact Memory. Normal language and speech. Cranial nerve exam unremarkable. MMT: UE motor 4/5 prox to distal. LE 4-5 HF, 4/5 KE and 4+/5 ADF/PF. Sensory exam normal for light touch and pain in all 4  limbs. No limb ataxia or cerebellar signs. No abnormal tone appreciated.  Marland Kitchen    Psychiatric:        Mood and Affect: Mood normal.        Behavior: Behavior normal.     Results for orders placed or performed during the hospital encounter of 11/29/23 (from the past 24 hours)  Glucose, capillary     Status: Abnormal   Collection Time: 01/10/24 12:52 PM  Result Value Ref Range   Glucose-Capillary 130 (H) 70 - 99 mg/dL   Comment 1 Notify RN    Comment 2 Document in Chart   Renal function panel     Status: Abnormal   Collection Time: 01/10/24  1:52 PM  Result Value Ref Range   Sodium 133 (L) 135 - 145 mmol/L   Potassium 4.3 3.5 - 5.1 mmol/L   Chloride 91 (L) 98 - 111 mmol/L   CO2 22 22 - 32 mmol/L   Glucose, Bld 113 (H) 70 - 99 mg/dL   BUN 59 (H) 8 - 23 mg/dL   Creatinine, Ser 1.61 (H) 0.44 - 1.00 mg/dL   Calcium 9.6 8.9 - 09.6 mg/dL   Phosphorus 5.3 (H) 2.5 - 4.6 mg/dL   Albumin 3.0 (L) 3.5 - 5.0 g/dL   GFR, Estimated 6 (L) >60 mL/min   Anion gap 20 (H) 5 - 15  Glucose, capillary     Status: Abnormal   Collection Time: 01/10/24  2:20 PM  Result Value Ref Range   Glucose-Capillary 119 (H) 70 - 99 mg/dL   Comment 1 Repeat Test   Glucose, capillary     Status: Abnormal   Collection Time: 01/10/24  9:43 PM  Result Value Ref Range   Glucose-Capillary 170 (H) 70 - 99 mg/dL  Magnesium     Status: None   Collection Time: 01/11/24  5:05 AM  Result Value Ref Range   Magnesium 2.1 1.7 - 2.4 mg/dL  Protime-INR     Status: Abnormal   Collection Time: 01/11/24  5:05 AM  Result Value Ref Range   Prothrombin Time 26.8 (H) 11.4 - 15.2 seconds   INR 2.5 (H) 0.8 - 1.2  Glucose, capillary     Status: Abnormal   Collection Time: 01/11/24  6:17 AM  Result Value Ref Range   Glucose-Capillary 118 (H) 70 - 99 mg/dL   No results found.  Assessment/Plan: Diagnosis: 73 year old female with debility after mitral valve replacement and tricuspid valve repair. Does the need for close, 24 hr/day  medical supervision in concert with the patient's rehab needs make it unreasonable for this patient to be served in a less intensive setting? Yes -sternal precautions  Co-Morbidities requiring supervision/potential complications:  -Acute on chronic kidney failure now on hemodialysis- -paroxysmal atrial fibrillation -Situational depression -Poor  p.o. intake/nutritional considerations -Slow transit constipation Due to bladder management, bowel management, safety, skin/wound care, disease management, medication administration, pain management, and patient education, does the patient require 24 hr/day rehab nursing? Yes Does the patient require coordinated care of a physician, rehab nurse, therapy disciplines of PT, OT to address physical and functional deficits in the context of the above medical diagnosis(es)? Yes Addressing deficits in the following areas: balance, endurance, locomotion, strength, transferring, bowel/bladder control, bathing, dressing, feeding, grooming, toileting, and psychosocial support Can the patient actively participate in an intensive therapy program of at least 3 hrs of therapy per day at least 5 days per week? Yes The potential for patient to make measurable gains while on inpatient rehab is excellent Anticipated functional outcomes upon discharge from inpatient rehab are modified independent  with PT, modified independent and supervision with OT, n/a with SLP. Estimated rehab length of stay to reach the above functional goals is: 8-11 days Anticipated discharge destination: Home Overall Rehab/Functional Prognosis: excellent  POST ACUTE RECOMMENDATIONS: This patient's condition is appropriate for continued rehabilitative care in the following setting: CIR Patient has agreed to participate in recommended program. Yes Note that insurance prior authorization may be required for reimbursement for recommended care.  Comment: Vanessa [name] is extremely motivated to regain  independence. Wants to be able to climb 2 steps to enter through the kitchen which is her usual entry spot. Has family who can support. Rehab Admissions Coordinator to follow up.     I have personally performed a face to face diagnostic evaluation of this patient. Additionally, I have examined the patient's medical record including any pertinent labs and radiographic images.    Thanks,  Ranelle Oyster, MD 01/11/2024

## 2024-01-11 NOTE — Progress Notes (Signed)
Inpatient Rehab Admissions Coordinator:    CIR following. I spoke with sisters who state that they can rotate to provide a month of care after d/c from CIR. I will send case to insurance for auth once OT enters their note for today.   Megan Salon, MS, CCC-SLP Rehab Admissions Coordinator  [phone] (celll) [phone] (office)

## 2024-01-11 NOTE — Progress Notes (Signed)
Patient ID: Vanessa [name], female   DOB: [birth year], 73 y.o.   MRN: [id number] [facility] Progress Note   Assessment/ Plan:   1. Acute kidney Injury on chronic kidney disease stage IIIa: Appears to be primarily from ischemic ATN in the setting of shock (mixed vasoplegic/cardiogenic).  Dense acute kidney injury with dialysis dependency and no evidence of renal recovery yet. On schedule for dialysis today (while here MWF).  She was accepted to a TTS second shift dialysis schedule at [facility] and expresses the desire to have on MWF shift instead; renal navigator has reached out to patient placement to inquire about this. - HD tomorrow  2.  Severe mitral regurgitation/tricuspid regurgitation: Status post repair with postoperative cardiac tamponade.  Additional management per cardiothoracic surgery. 3.  Acute exacerbation of congestive heart failure with reduced ejection fraction: Volume status significantly improved with ongoing hemodialysis/ultrafiltration 4.  Shock: From a combination of cardiogenic and vasoplegic etiology-currently on midodrine after previously requiring pressor/inotropic support. 5.  Hyperphosphatemia: Phosphorus level currently at goal for which we will continue phosphorus binder and continue low phosphorus/renal diet.  Subjective:    For HD tomorrow.  Posisbly going to CIR  Objective:   BP 95/60 (BP Location: Left Arm)   Pulse 78   Temp 98.2 F (36.8 C) (Oral)   Resp 19   Ht 5\' 3"  (1.6 m)   Wt 68.8 kg   SpO2 97%   BMI 26.87 kg/m   Intake/Output Summary (Last 24 hours) at 01/11/2024 1040 Last data filed at 01/11/2024 0500 Gross per 24 hour  Intake 320 ml  Output 2000 ml  Net -1680 ml   Weight change: -1.9 kg  Physical Exam: Gen: Sitting up comfortably in bed CVS: Pulse regular rhythm, normal rate, S1 and S2 normal.  Right IJ TDC Resp: Clear to auscultation bilaterally, no rales/rhonchi Abd: Soft, obese, nontender, bowel  sounds normal Ext: Trace/1+ lower extremity edema  Imaging: No results found.  Labs: BMET Recent Labs  Lab 01/05/24 0500 01/06/24 0500 01/07/24 0546 01/08/24 0640 01/09/24 0350 01/10/24 1352  NA 132* 135 133* 135 132* 133*  K 4.3 4.0 4.4 4.1 4.3 4.3  CL 90* 92* 91* 94* 92* 91*  CO2 26 27 26 26 25 22   GLUCOSE 101* 102* 102* 108* 110* 113*  BUN 40* 21 35* 22 38* 59*  CREATININE 5.23* 3.10* 4.79* 3.36* 4.85* 7.12*  CALCIUM 9.0 8.9 9.1 9.4 9.2 9.6  PHOS 6.5* 3.3 4.6 3.6 4.0 5.3*   CBC Recent Labs  Lab 01/07/24 0546 01/08/24 0640 01/09/24 0350 01/10/24 0500  WBC 9.4 9.4 10.4 9.6  HGB 10.8* 11.8* 11.4* 11.1*  HCT 33.8* 37.5 35.9* 35.0*  MCV 98.5 100.3* 98.9 98.0  PLT 177 183 180 168    Medications:     amiodarone  200 mg Oral Daily   ascorbic acid  250 mg Oral BID   Chlorhexidine Gluconate Cloth  6 each Topical Q0600   darbepoetin (ARANESP) injection - NON-DIALYSIS  100 mcg Subcutaneous Q Tue-1800   Gerhardt's butt cream   Topical BID   insulin aspart  0-15 Units Subcutaneous TID WC   lanthanum  500 mg Oral TID WC   midodrine  20 mg Oral TID WC   mirtazapine  7.5 mg Oral QHS   multivitamin  1 tablet Oral BID   polyethylene glycol  17 g Oral Daily   QUEtiapine  25 mg Oral QHS   senna-docusate  2 tablet Oral BID  sertraline  25 mg Oral Daily   sodium chloride flush  10-40 mL Intracatheter Q12H   warfarin  3 mg Oral q1600   Warfarin - Pharmacist Dosing Inpatient   Does not apply q1600    Bufford Buttner MD 01/11/2024, 10:40 AM

## 2024-01-12 ENCOUNTER — Inpatient Hospital Stay (HOSPITAL_COMMUNITY)
Admission: AD | Admit: 2024-01-12 | Discharge: 2024-01-29 | DRG: 945 | Disposition: A | Discharge status: Home Health | Payer: PPO | Source: Intra-hospital | Attending: Physical Medicine and Rehabilitation | Admitting: Physical Medicine and Rehabilitation

## 2024-01-12 ENCOUNTER — Other Ambulatory Visit: Payer: Self-pay

## 2024-01-12 ENCOUNTER — Encounter (HOSPITAL_COMMUNITY): Payer: Self-pay | Admitting: Physical Medicine and Rehabilitation

## 2024-01-12 DIAGNOSIS — Z7984 Long term (current) use of oral hypoglycemic drugs: Secondary | ICD-10-CM

## 2024-01-12 DIAGNOSIS — E871 Hypo-osmolality and hyponatremia: Secondary | ICD-10-CM | POA: Diagnosis present

## 2024-01-12 DIAGNOSIS — R5383 Other fatigue: Secondary | ICD-10-CM | POA: Diagnosis present

## 2024-01-12 DIAGNOSIS — R112 Nausea with vomiting, unspecified: Secondary | ICD-10-CM | POA: Diagnosis not present

## 2024-01-12 DIAGNOSIS — N183 Chronic kidney disease, stage 3 unspecified: Secondary | ICD-10-CM | POA: Diagnosis present

## 2024-01-12 DIAGNOSIS — D649 Anemia, unspecified: Secondary | ICD-10-CM | POA: Diagnosis not present

## 2024-01-12 DIAGNOSIS — F32A Depression, unspecified: Secondary | ICD-10-CM | POA: Diagnosis present

## 2024-01-12 DIAGNOSIS — N170 Acute kidney failure with tubular necrosis: Secondary | ICD-10-CM | POA: Diagnosis present

## 2024-01-12 DIAGNOSIS — Z952 Presence of prosthetic heart valve: Secondary | ICD-10-CM | POA: Diagnosis not present

## 2024-01-12 DIAGNOSIS — N179 Acute kidney failure, unspecified: Secondary | ICD-10-CM | POA: Diagnosis not present

## 2024-01-12 DIAGNOSIS — Z79899 Other long term (current) drug therapy: Secondary | ICD-10-CM

## 2024-01-12 DIAGNOSIS — Z8249 Family history of ischemic heart disease and other diseases of the circulatory system: Secondary | ICD-10-CM

## 2024-01-12 DIAGNOSIS — E663 Overweight: Secondary | ICD-10-CM | POA: Diagnosis present

## 2024-01-12 DIAGNOSIS — N1831 Chronic kidney disease, stage 3a: Secondary | ICD-10-CM | POA: Diagnosis present

## 2024-01-12 DIAGNOSIS — K5901 Slow transit constipation: Secondary | ICD-10-CM | POA: Diagnosis not present

## 2024-01-12 DIAGNOSIS — R5381 Other malaise: Principal | ICD-10-CM | POA: Diagnosis present

## 2024-01-12 DIAGNOSIS — D62 Acute posthemorrhagic anemia: Secondary | ICD-10-CM | POA: Diagnosis present

## 2024-01-12 DIAGNOSIS — F329 Major depressive disorder, single episode, unspecified: Secondary | ICD-10-CM | POA: Diagnosis present

## 2024-01-12 DIAGNOSIS — Z4802 Encounter for removal of sutures: Secondary | ICD-10-CM | POA: Diagnosis not present

## 2024-01-12 DIAGNOSIS — I5023 Acute on chronic systolic (congestive) heart failure: Secondary | ICD-10-CM | POA: Diagnosis present

## 2024-01-12 DIAGNOSIS — Z6827 Body mass index (BMI) 27.0-27.9, adult: Secondary | ICD-10-CM

## 2024-01-12 DIAGNOSIS — K5900 Constipation, unspecified: Secondary | ICD-10-CM | POA: Diagnosis present

## 2024-01-12 DIAGNOSIS — N186 End stage renal disease: Secondary | ICD-10-CM | POA: Diagnosis not present

## 2024-01-12 DIAGNOSIS — Z803 Family history of malignant neoplasm of breast: Secondary | ICD-10-CM

## 2024-01-12 DIAGNOSIS — Z95818 Presence of other cardiac implants and grafts: Secondary | ICD-10-CM

## 2024-01-12 DIAGNOSIS — I314 Cardiac tamponade: Secondary | ICD-10-CM | POA: Diagnosis present

## 2024-01-12 DIAGNOSIS — F3289 Other specified depressive episodes: Secondary | ICD-10-CM | POA: Diagnosis present

## 2024-01-12 DIAGNOSIS — Z7901 Long term (current) use of anticoagulants: Secondary | ICD-10-CM

## 2024-01-12 DIAGNOSIS — I428 Other cardiomyopathies: Secondary | ICD-10-CM | POA: Diagnosis present

## 2024-01-12 DIAGNOSIS — I130 Hypertensive heart and chronic kidney disease with heart failure and stage 1 through stage 4 chronic kidney disease, or unspecified chronic kidney disease: Secondary | ICD-10-CM | POA: Diagnosis present

## 2024-01-12 DIAGNOSIS — H538 Other visual disturbances: Secondary | ICD-10-CM | POA: Diagnosis not present

## 2024-01-12 DIAGNOSIS — G4700 Insomnia, unspecified: Secondary | ICD-10-CM | POA: Diagnosis present

## 2024-01-12 DIAGNOSIS — D631 Anemia in chronic kidney disease: Secondary | ICD-10-CM | POA: Diagnosis present

## 2024-01-12 DIAGNOSIS — Z992 Dependence on renal dialysis: Secondary | ICD-10-CM

## 2024-01-12 DIAGNOSIS — I5033 Acute on chronic diastolic (congestive) heart failure: Secondary | ICD-10-CM | POA: Diagnosis present

## 2024-01-12 DIAGNOSIS — I959 Hypotension, unspecified: Secondary | ICD-10-CM | POA: Diagnosis not present

## 2024-01-12 DIAGNOSIS — I4892 Unspecified atrial flutter: Secondary | ICD-10-CM | POA: Diagnosis present

## 2024-01-12 DIAGNOSIS — L89326 Pressure-induced deep tissue damage of left buttock: Secondary | ICD-10-CM | POA: Diagnosis present

## 2024-01-12 DIAGNOSIS — Z888 Allergy status to other drugs, medicaments and biological substances status: Secondary | ICD-10-CM

## 2024-01-12 DIAGNOSIS — E1122 Type 2 diabetes mellitus with diabetic chronic kidney disease: Secondary | ICD-10-CM | POA: Diagnosis present

## 2024-01-12 DIAGNOSIS — I950 Idiopathic hypotension: Secondary | ICD-10-CM | POA: Diagnosis present

## 2024-01-12 DIAGNOSIS — I4819 Other persistent atrial fibrillation: Secondary | ICD-10-CM | POA: Diagnosis present

## 2024-01-12 DIAGNOSIS — I5022 Chronic systolic (congestive) heart failure: Secondary | ICD-10-CM | POA: Diagnosis present

## 2024-01-12 DIAGNOSIS — E1165 Type 2 diabetes mellitus with hyperglycemia: Secondary | ICD-10-CM | POA: Diagnosis present

## 2024-01-12 DIAGNOSIS — F419 Anxiety disorder, unspecified: Secondary | ICD-10-CM | POA: Diagnosis present

## 2024-01-12 DIAGNOSIS — I272 Pulmonary hypertension, unspecified: Secondary | ICD-10-CM | POA: Diagnosis present

## 2024-01-12 DIAGNOSIS — T8119XD Other postprocedural shock, subsequent encounter: Secondary | ICD-10-CM

## 2024-01-12 LAB — CBC
HCT: 37.4 % (ref 36.0–46.0)
Hemoglobin: 11.8 g/dL — ABNORMAL LOW (ref 12.0–15.0)
MCH: 30.9 pg (ref 26.0–34.0)
MCHC: 31.6 g/dL (ref 30.0–36.0)
MCV: 97.9 fL (ref 80.0–100.0)
Platelets: 182 10*3/uL (ref 150–400)
RBC: 3.82 MIL/uL — ABNORMAL LOW (ref 3.87–5.11)
RDW: 17.4 % — ABNORMAL HIGH (ref 11.5–15.5)
WBC: 9.6 10*3/uL (ref 4.0–10.5)
nRBC: 0 % (ref 0.0–0.2)

## 2024-01-12 LAB — PROTIME-INR
INR: 2.6 — ABNORMAL HIGH (ref 0.8–1.2)
Prothrombin Time: 28.4 s — ABNORMAL HIGH (ref 11.4–15.2)

## 2024-01-12 LAB — GLUCOSE, CAPILLARY
Glucose-Capillary: 103 mg/dL — ABNORMAL HIGH (ref 70–99)
Glucose-Capillary: 107 mg/dL — ABNORMAL HIGH (ref 70–99)
Glucose-Capillary: 146 mg/dL — ABNORMAL HIGH (ref 70–99)
Glucose-Capillary: 135 mg/dL — ABNORMAL HIGH (ref 70–99)

## 2024-01-12 LAB — RENAL FUNCTION PANEL
Albumin: 3.1 g/dL — ABNORMAL LOW (ref 3.5–5.0)
Anion gap: 16 — ABNORMAL HIGH (ref 5–15)
BUN: 34 mg/dL — ABNORMAL HIGH (ref 8–23)
CO2: 24 mmol/L (ref 22–32)
Calcium: 9.2 mg/dL (ref 8.9–10.3)
Chloride: 94 mmol/L — ABNORMAL LOW (ref 98–111)
Creatinine, Ser: 5.16 mg/dL — ABNORMAL HIGH (ref 0.44–1.00)
GFR, Estimated: 8 mL/min — ABNORMAL LOW (ref >60)
Glucose, Bld: 154 mg/dL — ABNORMAL HIGH (ref 70–99)
Phosphorus: 5 mg/dL — ABNORMAL HIGH (ref 2.5–4.6)
Potassium: 4.4 mmol/L (ref 3.5–5.1)
Sodium: 134 mmol/L — ABNORMAL LOW (ref 135–145)

## 2024-01-12 LAB — MAGNESIUM: Magnesium: 2.3 mg/dL (ref 1.7–2.4)

## 2024-01-12 MED ORDER — ACETAMINOPHEN 325 MG PO TABS: 325.0000 mg | ORAL_TABLET | ORAL | Status: DC | PRN

## 2024-01-12 MED ORDER — SODIUM CHLORIDE 0.9% FLUSH
10.0000 mL | Freq: Two times a day (BID) | INTRAVENOUS | Status: DC
  Administered 2024-01-12 – 2024-01-28 (×24): 10 mL

## 2024-01-12 MED ORDER — SENNOSIDES-DOCUSATE SODIUM 8.6-50 MG PO TABS: 2.0000 | ORAL_TABLET | Freq: Two times a day (BID) | ORAL | Status: DC

## 2024-01-12 MED ORDER — SALINE SPRAY 0.65 % NA SOLN: 1.0000 | NASAL | Status: DC | PRN

## 2024-01-12 MED ORDER — SERTRALINE HCL 50 MG PO TABS
25.0000 mg | ORAL_TABLET | Freq: Every day | ORAL | Status: DC
  Administered 2024-01-13 – 2024-01-29 (×17): 25 mg via ORAL
  Filled 2024-01-12 (×17): qty 1

## 2024-01-12 MED ORDER — ARTIFICIAL TEARS OPHTHALMIC OINT: TOPICAL_OINTMENT | OPHTHALMIC | Status: DC | PRN

## 2024-01-12 MED ORDER — RENA-VITE PO TABS
1.0000 | ORAL_TABLET | Freq: Two times a day (BID) | ORAL | Status: DC
  Administered 2024-01-12 – 2024-01-29 (×34): 1 via ORAL
  Filled 2024-01-12 (×34): qty 1

## 2024-01-12 MED ORDER — GERHARDT'S BUTT CREAM
TOPICAL_CREAM | Freq: Two times a day (BID) | CUTANEOUS | Status: DC
  Filled 2024-01-12: qty 60

## 2024-01-12 MED ORDER — SORBITOL 70 % SOLN: 60.0000 mL | Freq: Every day | Status: DC | PRN

## 2024-01-12 MED ORDER — WARFARIN SODIUM 3 MG PO TABS
3.0000 mg | ORAL_TABLET | Freq: Every day | ORAL | Status: DC
  Administered 2024-01-13: 3 mg via ORAL
  Filled 2024-01-12 (×2): qty 1

## 2024-01-12 MED ORDER — ANTICOAGULANT SODIUM CITRATE 4% (200MG/5ML) IV SOLN
5.0000 mL | Status: DC | PRN
  Administered 2024-01-21: 5 mL
  Filled 2024-01-12: qty 5

## 2024-01-12 MED ORDER — MIDODRINE HCL 10 MG PO TABS: 20.0000 mg | ORAL_TABLET | Freq: Three times a day (TID) | ORAL | 2 refills | Status: DC

## 2024-01-12 MED ORDER — WARFARIN SODIUM 3 MG PO TABS: 3.0000 mg | ORAL_TABLET | Freq: Every day | ORAL | Status: DC

## 2024-01-12 MED ORDER — PENTAFLUOROPROP-TETRAFLUOROETH EX AERO: 1.0000 | INHALATION_SPRAY | CUTANEOUS | Status: DC | PRN

## 2024-01-12 MED ORDER — QUETIAPINE FUMARATE 25 MG PO TABS
25.0000 mg | ORAL_TABLET | Freq: Every day | ORAL | Status: DC
  Administered 2024-01-12 – 2024-01-18 (×7): 25 mg via ORAL
  Filled 2024-01-12 (×7): qty 1

## 2024-01-12 MED ORDER — CARMEX CLASSIC LIP BALM EX OINT: TOPICAL_OINTMENT | CUTANEOUS | Status: DC | PRN

## 2024-01-12 MED ORDER — MELATONIN 3 MG PO TABS: 3.0000 mg | ORAL_TABLET | Freq: Every evening | ORAL | Status: DC | PRN

## 2024-01-12 MED ORDER — MIDODRINE HCL 5 MG PO TABS
20.0000 mg | ORAL_TABLET | Freq: Three times a day (TID) | ORAL | Status: DC
  Administered 2024-01-13 – 2024-01-29 (×49): 20 mg via ORAL
  Filled 2024-01-12 (×51): qty 4

## 2024-01-12 MED ORDER — POLYETHYLENE GLYCOL 3350 17 G PO PACK: 17.0000 g | PACK | Freq: Every day | ORAL | Status: DC

## 2024-01-12 MED ORDER — NEPRO/CARBSTEADY PO LIQD: 237.0000 mL | Freq: Two times a day (BID) | ORAL | Status: DC

## 2024-01-12 MED ORDER — ASCORBIC ACID 250 MG PO TABS: 250.0000 mg | ORAL_TABLET | Freq: Two times a day (BID) | ORAL | Status: DC

## 2024-01-12 MED ORDER — MIRTAZAPINE 15 MG PO TABS
7.5000 mg | ORAL_TABLET | Freq: Every day | ORAL | Status: DC
  Administered 2024-01-12 – 2024-01-28 (×17): 7.5 mg via ORAL
  Filled 2024-01-12 (×17): qty 1

## 2024-01-12 MED ORDER — ENSURE ENLIVE PO LIQD: 237.0000 mL | Freq: Three times a day (TID) | ORAL | Status: DC | PRN

## 2024-01-12 MED ORDER — POLYETHYLENE GLYCOL 3350 17 G PO PACK
17.0000 g | PACK | Freq: Every day | ORAL | Status: DC
  Administered 2024-01-13 – 2024-01-28 (×11): 17 g via ORAL
  Filled 2024-01-12 (×17): qty 1

## 2024-01-12 MED ORDER — AMIODARONE HCL 200 MG PO TABS: 200.0000 mg | ORAL_TABLET | Freq: Every day | ORAL | 1 refills | Status: DC

## 2024-01-12 MED ORDER — DARBEPOETIN ALFA 100 MCG/0.5ML IJ SOSY: 100.0000 ug | PREFILLED_SYRINGE | INTRAMUSCULAR | Status: DC

## 2024-01-12 MED ORDER — RENA-VITE PO TABS: 1.0000 | ORAL_TABLET | Freq: Two times a day (BID) | ORAL | Status: DC

## 2024-01-12 MED ORDER — POLYETHYLENE GLYCOL 3350 17 G PO PACK: 17.0000 g | PACK | Freq: Every day | ORAL | Status: DC | PRN

## 2024-01-12 MED ORDER — OXYCODONE HCL 5 MG PO TABS: 5.0000 mg | ORAL_TABLET | ORAL | 0 refills | Status: DC | PRN

## 2024-01-12 MED ORDER — LEVALBUTEROL HCL 0.63 MG/3ML IN NEBU: 0.6300 mg | INHALATION_SOLUTION | Freq: Four times a day (QID) | RESPIRATORY_TRACT | Status: DC | PRN

## 2024-01-12 MED ORDER — TRAZODONE HCL 50 MG PO TABS: 100.0000 mg | ORAL_TABLET | Freq: Every evening | ORAL | Status: DC | PRN

## 2024-01-12 MED ORDER — VITAMIN C 500 MG PO TABS
250.0000 mg | ORAL_TABLET | Freq: Two times a day (BID) | ORAL | Status: DC
  Administered 2024-01-12 – 2024-01-29 (×34): 250 mg via ORAL
  Filled 2024-01-12 (×34): qty 1

## 2024-01-12 MED ORDER — PROCHLORPERAZINE 25 MG RE SUPP: 12.5000 mg | Freq: Four times a day (QID) | RECTAL | Status: DC | PRN

## 2024-01-12 MED ORDER — ALUMINUM HYDROXIDE GEL 320 MG/5ML PO SUSP: 10.0000 mL | Freq: Four times a day (QID) | ORAL | Status: DC | PRN

## 2024-01-12 MED ORDER — SIMETHICONE 80 MG PO CHEW
80.0000 mg | CHEWABLE_TABLET | Freq: Four times a day (QID) | ORAL | Status: DC | PRN
Start: 2024-01-12 — End: 2024-01-29

## 2024-01-12 MED ORDER — LIDOCAINE HCL (PF) 1 % IJ SOLN: 5.0000 mL | INTRAMUSCULAR | Status: DC | PRN

## 2024-01-12 MED ORDER — SERTRALINE HCL 25 MG PO TABS: 25.0000 mg | ORAL_TABLET | Freq: Every day | ORAL | 1 refills | Status: DC

## 2024-01-12 MED ORDER — MILK AND MOLASSES ENEMA: 1.0000 | Freq: Every day | RECTAL | Status: DC | PRN

## 2024-01-12 MED ORDER — LANTHANUM CARBONATE 500 MG PO CHEW
500.0000 mg | CHEWABLE_TABLET | Freq: Three times a day (TID) | ORAL | Status: DC
  Administered 2024-01-13 – 2024-01-28 (×46): 500 mg via ORAL
  Filled 2024-01-12 (×47): qty 1

## 2024-01-12 MED ORDER — LIDOCAINE HCL URETHRAL/MUCOSAL 2 % EX GEL: CUTANEOUS | Status: DC | PRN

## 2024-01-12 MED ORDER — LIDOCAINE-PRILOCAINE 2.5-2.5 % EX CREA: 1.0000 | TOPICAL_CREAM | CUTANEOUS | Status: DC | PRN

## 2024-01-12 MED ORDER — PROCHLORPERAZINE EDISYLATE 10 MG/2ML IJ SOLN: 5.0000 mg | Freq: Four times a day (QID) | INTRAMUSCULAR | Status: DC | PRN

## 2024-01-12 MED ORDER — PROCHLORPERAZINE MALEATE 5 MG PO TABS
5.0000 mg | ORAL_TABLET | Freq: Four times a day (QID) | ORAL | Status: DC | PRN
  Administered 2024-01-13 – 2024-01-14 (×2): 10 mg via ORAL
  Filled 2024-01-12 (×2): qty 2

## 2024-01-12 MED ORDER — ORAL CARE MOUTH RINSE: 15.0000 mL | OROMUCOSAL | Status: DC | PRN

## 2024-01-12 MED ORDER — SODIUM CHLORIDE 0.9% FLUSH: 10.0000 mL | INTRAVENOUS | Status: DC | PRN

## 2024-01-12 MED ORDER — GERHARDT'S BUTT CREAM: 1.0000 | TOPICAL_CREAM | Freq: Two times a day (BID) | CUTANEOUS | Status: DC

## 2024-01-12 MED ORDER — TRAZODONE HCL 100 MG PO TABS: 100.0000 mg | ORAL_TABLET | Freq: Every evening | ORAL | 0 refills | Status: DC | PRN

## 2024-01-12 MED ORDER — QUETIAPINE FUMARATE 25 MG PO TABS: 25.0000 mg | ORAL_TABLET | Freq: Every day | ORAL | 0 refills | Status: DC

## 2024-01-12 MED ORDER — MIRTAZAPINE 7.5 MG PO TABS: 7.5000 mg | ORAL_TABLET | Freq: Every day | ORAL | 0 refills | Status: DC

## 2024-01-12 MED ORDER — GUAIFENESIN-DM 100-10 MG/5ML PO SYRP: 5.0000 mL | ORAL_SOLUTION | Freq: Four times a day (QID) | ORAL | Status: DC | PRN

## 2024-01-12 MED ORDER — WARFARIN - PHARMACIST DOSING INPATIENT: Freq: Every day | Status: DC

## 2024-01-12 MED ORDER — METOCLOPRAMIDE HCL 5 MG PO TABS: 5.0000 mg | ORAL_TABLET | Freq: Three times a day (TID) | ORAL | Status: DC | PRN

## 2024-01-12 MED ORDER — OXYCODONE HCL 5 MG PO TABS: 5.0000 mg | ORAL_TABLET | ORAL | Status: DC | PRN

## 2024-01-12 MED ORDER — LANTHANUM CARBONATE 500 MG PO CHEW: 500.0000 mg | CHEWABLE_TABLET | Freq: Three times a day (TID) | ORAL | Status: DC

## 2024-01-12 MED ORDER — SENNOSIDES-DOCUSATE SODIUM 8.6-50 MG PO TABS
2.0000 | ORAL_TABLET | Freq: Two times a day (BID) | ORAL | Status: DC
  Administered 2024-01-12 – 2024-01-24 (×16): 2 via ORAL
  Filled 2024-01-12 (×24): qty 2

## 2024-01-12 MED ORDER — DIPHENHYDRAMINE HCL 25 MG PO CAPS: 25.0000 mg | ORAL_CAPSULE | Freq: Four times a day (QID) | ORAL | Status: DC | PRN

## 2024-01-12 MED ORDER — AMIODARONE HCL 200 MG PO TABS
200.0000 mg | ORAL_TABLET | Freq: Every day | ORAL | Status: DC
  Administered 2024-01-13 – 2024-01-29 (×17): 200 mg via ORAL
  Filled 2024-01-12 (×17): qty 1

## 2024-01-12 NOTE — Progress Notes (Signed)
PT Cancellation Note  Patient Details Name: Vanessa [name] MRN: [id number] DOB: 02-May-1951   Cancelled Treatment:    Reason Eval/Treat Not Completed: Patient at procedure or test/unavailable. Pt in HD.   Angelina Ok [facility] 01/12/2024, 8:44 AM Skip Mayer PT Acute Rehabilitation Services Office [phone]

## 2024-01-12 NOTE — Plan of Care (Signed)

## 2024-01-12 NOTE — Progress Notes (Signed)
Patient admitted to rehab from transfer unit. Patient is alert and oriented.  Admission assessment completed. Call light is within reach.

## 2024-01-12 NOTE — Progress Notes (Signed)
PHARMACY - ANTICOAGULATION CONSULT NOTE  Pharmacy Consult for Warfarin Indication: atrial fibrillation  No Known Allergies  Patient Measurements: Height: 5\' 3"  (160 cm) Weight: 69.9 kg (154 lb 1.6 oz) IBW/kg (Calculated) : 52.4 Heparin Dosing Weight: ~ 70 kg  Vital Signs: Temp: 97.6 F (36.4 C) (02/12 0841) Temp Source: Oral (02/12 0740) BP: 98/77 (02/12 1030) Pulse Rate: 87 (02/12 1030)  Labs: Recent Labs    01/10/24 0500 01/10/24 1352 01/11/24 0505 01/12/24 0605 01/12/24 0719  HGB 11.1*  --   --  11.8*  --   HCT 35.0*  --   --  37.4  --   PLT 168  --   --  182  --   LABPROT 26.3*  --  26.8* 28.4*  --   INR 2.4*  --  2.5* 2.6*  --   CREATININE  --  7.12*  --   --  5.16*   Estimated Creatinine Clearance: 9.2 mL/min (A) (by C-G formula based on SCr of 5.16 mg/dL (H)).  Assessment: 73 yo female s/p scheduled bioprosthetic mitral valve replacement with tissue valve 12/30 now with afib, pharmacy asked to dose anticoagulation with IV heparin. Spontaneously converted 1/7; flipped back to AF overnight 1/9 0300. Baseline INR 1.1, slow uptrend to 1.3 s/p 6 days warfarin therapy, total dose 20 mg, then held 1/21-24 for tunneled access plans. Patient now s/p [facility] and tunneled central line placement 1/24 AM on CRRT, plan HD MWF.  CBC remains low-stable, off norepinephrine since 1/26, remains on midodrine. Resumed anticoagulation per VIR after tunneled cath placement. Heparin stopped 1/31 with INR >2.  INR seems to be stable (2.6 today). Average dose for the past 7d is around 3. Continue current dose and reduce INR checks.  Hgb up to 11.8  Goal of Therapy:  INR goal 2 - 3 (bMVR) Monitor platelets by anticoagulation protocol: Yes   Plan:  Coumadin 3mg  qday MWF INR  Rexford Maus, PharmD, BCPS 01/12/2024 10:47 AM

## 2024-01-12 NOTE — TOC Transition Note (Signed)
Transition of Care (TOC) - Discharge Note Donn Pierini RN, BSN Transitions of Care Unit 4E- RN Case Manager See Treatment Team for direct phone #   Patient Details  Name: Vanessa [name] MRN: [id number] Date of Birth: Oct 27, 1951  Transition of Care [facility]) CM/SW Contact:  Darrold Span, RN Phone Number: 01/12/2024, 3:47 PM   Clinical Narrative:    Renal Navigator has confirmed pt has received outpt HD chair time for MWF at [facility] for 12:10pm chair time. CIR has confirmed that they can admit pt today for INPT rehab stay with plan for pt's sisters to come stay with her post rehab stay.   Pt stable to transition to INPT rehab today.   [facility] liaison has been following pt for [facility] needs under TCTS office referral- CM has notified liaison of transition to [facility] INPT rehab today and liaison to follow up for any [facility] needs post rehab stay   No further TOC needs at this time.    Final next level of care: IP Rehab Facility Barriers to Discharge: Barriers Resolved   Patient Goals and CMS Choice Patient states their goals for this hospitalization and ongoing recovery are:: wants to recover CMS Medicare.gov Compare Post Acute Care list provided to:: Patient Represenative (must comment) Choice offered to / list presented to : [facility] POA / Guardian      Discharge Placement               [facility] INPT rehab        Discharge Plan and Services Additional resources added to the After Visit Summary for     Discharge Planning Services: CM Consult Post Acute Care Choice: IP Rehab          DME Arranged: N/A DME Agency: NA       HH Arranged: RN, PT HH Agency: [facility]        Social Drivers of Health (SDOH) Interventions SDOH Screenings   Food Insecurity: No Food Insecurity (11/30/2023)  Housing: Low Risk  (11/30/2023)  Transportation Needs: No Transportation Needs (11/30/2023)  Utilities: Not At Risk (11/30/2023)  Social Connections: Moderately Integrated  (11/30/2023)  Tobacco Use: Low Risk  (11/29/2023)     Readmission Risk Interventions    01/12/2024    3:47 PM  Readmission Risk Prevention Plan  Transportation Screening Complete  HRI or Home Care Consult Complete  Social Work Consult for Recovery Care Planning/Counseling Complete  Palliative Care Screening Not Applicable  Medication Review Oceanographer) Complete

## 2024-01-12 NOTE — Progress Notes (Signed)
   01/12/24 1230  Vitals  Temp 97.8 F (36.6 C)  Pulse Rate 92  Resp (!) 26  BP 114/74  SpO2 97 %  O2 Device Room Air  Weight 66.7 kg  Type of Weight Post-Dialysis  Post Treatment  Dialyzer Clearance Clear  Hemodialysis Intake (mL) 0 mL  Liters Processed 84  Fluid Removed (mL) 1500 mL  Tolerated HD Treatment Yes   Received patient in bed to unit.  Alert and oriented.  Informed consent signed and in chart.   TX duration:3.5hrs  Patient tolerated well.  Transported back to the room  Alert, without acute distress.  Hand-off given to patient's nurse.   Access used: [facility] Access issues: none  Total UF removed: 1.5L Medication(s) given: none   [facility]

## 2024-01-12 NOTE — Progress Notes (Signed)
Patient received from HD, V/S taken, CBG checked.   01/12/24 1308  Vitals  Temp 98.1 F (36.7 C)  Temp Source Oral  BP 94/73  MAP (mmHg) 80  BP Location Left Arm  BP Method Automatic  Patient Position (if appropriate) Lying  Pulse Rate 100  Pulse Rate Source Monitor  ECG Heart Rate (!) 101  Resp 19  Level of Consciousness  Level of Consciousness Alert  MEWS COLOR  MEWS Score Color Yellow  Oxygen Therapy  SpO2 93 %  O2 Device Room Air  MEWS Score  MEWS Temp 0  MEWS Systolic 1  MEWS Pulse 1  MEWS RR 0  MEWS LOC 0  MEWS Score 2

## 2024-01-12 NOTE — Progress Notes (Signed)
Inpatient Rehab Admissions Coordinator:    I have a CIR bed for this pt RN may call report to [phone]  Pt will transfer to CIR for an estimated 10-12 days with the goal of discharging home with assistance from sisters.   Megan Salon, MS, CCC-SLP Rehab Admissions Coordinator  [phone] (celll) [phone] (office)

## 2024-01-12 NOTE — Progress Notes (Signed)
Pt has been accepted at [facility] on MWF 12:10 pm chair time. Pt will need to arrive at 11:15 am for paperwork on first day. Clinic is also reporting that clinic can only hold this appt for one week for pt. Explained to clinic staff that it is requested that inpt rehab pts be clipped prior to admission to rehab so there will not be any delays at pt's d/c. Met with pt at bedside to provide the above info. Schedule letter provided as well. Pt not feeling well today so pt did not have many questions. Update provided to inpt rehab coordinator as well. Will assist as needed.   Olivia Canter Renal Navigator [phone]

## 2024-01-12 NOTE — Progress Notes (Signed)
PT Cancellation Note  Patient Details Name: Vanessa [name] MRN: [id number] DOB: 06/02/1951   Cancelled Treatment:    Reason Eval/Treat Not Completed: Fatigue limiting ability to participate. Pt reports not feeling well after HD today. Will try again tomorrow.   Angelina Ok [facility] 01/12/2024, 2:47 PM Skip Mayer PT Acute [employer] [phone]

## 2024-01-12 NOTE — Progress Notes (Signed)
Patient ID: Vanessa [name], female   DOB: 1951/05/03, 73 y.o.   MRN: [id number] [facility] Progress Note   Assessment/ Plan:   1. Acute kidney Injury on chronic kidney disease stage IIIa: Appears to be primarily from ischemic ATN in the setting of shock (mixed vasoplegic/cardiogenic).  Dense acute kidney injury with dialysis dependency and no evidence of renal recovery yet. On schedule for dialysis today (while here MWF).  She was accepted to a TTS second shift dialysis schedule at [facility] and expresses the desire to have on MWF shift instead; renal navigator has reached out to patient placement to inquire about this. - HD today 2.  Severe mitral regurgitation/tricuspid regurgitation: Status post repair with postoperative cardiac tamponade.  Additional management per cardiothoracic surgery. 3.  Acute exacerbation of congestive heart failure with reduced ejection fraction: Volume status significantly improved with ongoing hemodialysis/ultrafiltration 4.  Shock: From a combination of cardiogenic and vasoplegic etiology-currently on midodrine after previously requiring pressor/inotropic support. 5.  Hyperphosphatemia: Phosphorus level currently at goal for which we will continue phosphorus binder and continue low phosphorus/renal diet.  Subjective:    NO issues today.    Objective:   BP 106/71   Pulse 91   Temp 97.6 F (36.4 C)   Resp (!) 22   Ht 5\' 3"  (1.6 m)   Wt 69.9 kg   SpO2 100%   BMI 27.30 kg/m   Intake/Output Summary (Last 24 hours) at 01/12/2024 1120 Last data filed at 01/11/2024 1305 Gross per 24 hour  Intake 240 ml  Output --  Net 240 ml   Weight change: 0.4 kg  Physical Exam: Gen: Sitting up comfortably in bed CVS: Pulse regular rhythm, normal rate, S1 and S2 normal.  Right IJ TDC Resp: Clear to auscultation bilaterally, no rales/rhonchi Abd: Soft, obese, nontender, bowel sounds normal Ext: Trace/1+ lower extremity  edema  Imaging: No results found.  Labs: BMET Recent Labs  Lab 01/06/24 0500 01/07/24 0546 01/08/24 0640 01/09/24 0350 01/10/24 1352 01/12/24 0719  NA 135 133* 135 132* 133* 134*  K 4.0 4.4 4.1 4.3 4.3 4.4  CL 92* 91* 94* 92* 91* 94*  CO2 27 26 26 25 22 24   GLUCOSE 102* 102* 108* 110* 113* 154*  BUN 21 35* 22 38* 59* 34*  CREATININE 3.10* 4.79* 3.36* 4.85* 7.12* 5.16*  CALCIUM 8.9 9.1 9.4 9.2 9.6 9.2  PHOS 3.3 4.6 3.6 4.0 5.3* 5.0*   CBC Recent Labs  Lab 01/08/24 0640 01/09/24 0350 01/10/24 0500 01/12/24 0605  WBC 9.4 10.4 9.6 9.6  HGB 11.8* 11.4* 11.1* 11.8*  HCT 37.5 35.9* 35.0* 37.4  MCV 100.3* 98.9 98.0 97.9  PLT 183 180 168 182    Medications:     amiodarone  200 mg Oral Daily   ascorbic acid  250 mg Oral BID   Chlorhexidine Gluconate Cloth  6 each Topical Q0600   darbepoetin (ARANESP) injection - NON-DIALYSIS  100 mcg Subcutaneous Q Tue-1800   Gerhardt's butt cream   Topical BID   insulin aspart  0-15 Units Subcutaneous TID WC   lanthanum  500 mg Oral TID WC   midodrine  20 mg Oral TID WC   mirtazapine  7.5 mg Oral QHS   multivitamin  1 tablet Oral BID   polyethylene glycol  17 g Oral Daily   QUEtiapine  25 mg Oral QHS   senna-docusate  2 tablet Oral BID   sertraline  25 mg Oral Daily   sodium  chloride flush  10-40 mL Intracatheter Q12H   warfarin  3 mg Oral q1600   Warfarin - Pharmacist Dosing Inpatient   Does not apply q1600    Bufford Buttner MD 01/12/2024, 11:20 AM

## 2024-01-12 NOTE — Progress Notes (Signed)
PMR Admission Coordinator Pre-Admission Assessment   [name] is an 73 y.o., female MRN: [id number] DOB: 1950-12-04 Height: 5\' 3"  (160 cm) Weight: 71.2 kg                                                                                                                                                  Insurance Information HMO: no     PPO:  yes    PCP:      IPA:      80/20:      OTHER:  PRIMARY: [employer]   PPO   Policy#: [id number]       Subscriber: Pt  CM Name:       Phone#:  [phone]-option 1, spoke with Bernette Redbird     Fax#: [phone] Pre-Cert#: [id number]      Employer: Pt. Approved by Judeth Cornfield at [facility] on 2/12 for admit 2/12-2/18 Benefits:  Phone #:      Name:  Dolores Hoose Date: 12/01/2023 - 11/29/2024 Deductible: no deductible ($0) OOP Max: $3,400 ($0 met) CIR: $325/day co-pay for days 1-6, $0/day days 7-90 SNF:  $0/day co-pay for days 1-20, $214/day co-pay for days 21-100; limited to 100 days/benefit period Outpatient: $15/visit co-pay; limited by medical necessity Home Health:  100% coverage DME: 75% coverage; 25% co-insurance Providers: in network  SECONDARY:       Policy#:       Phone#:    Artist:       Phone#:    The Data processing manager" for patients in Inpatient Rehabilitation Facilities with attached "Privacy Act Statement-Health Care Records" was provided and verbally reviewed with: Patient   Emergency Contact Information Contact Information       Name Relation Home Work Mobile    Hunt     [phone]    Wright,Crystal Niece     [phone]    Tawni Carnes [phone]   [phone]    [facility] [phone]             Other Contacts   None on File      Current Medical History  Patient Admitting Diagnosis: AKI, new HD History of Present Illness: Pt. Is a 73 yo F with past medical history of TN, NICM, HFrEF (EF40-45%), Pulmonary HTN, CKD and severe MR s/p prior mitral clip x 2  in 2020 who was [facility] to [facility]. 12/30 with DOE. [facility] 11/29/23 without CAD. On 11/29/23 Pt. Undwewent MVR and tricuspid valve repair. She demonstrated respiratory failure post op and was intubated 12/02/23-12/05/23. AKI noted and Pt. Required CRRT 1/2-1/12/25. The CRRT was held on 1/12 because of worsening leukocytosis and poor access with plan to have line holiday. IHD on 1/13 with intra-dialytic hypotension and no UF. Resumed CRRT on 1/14-1/20 (new cath on 1/14) to manage volume. CRRT 1/22- 12/29/23. Pt. Continues to  be anuric without signs of renal recovery and was started on MWF HD schedule with plans for outpatient HD.  Pt. Was seen by PT/OT who recommend CIR to assist return to PLOF.  Glasgow Coma Scale Score: 15   Patient's medical record from [facility] has been reviewed by the rehabilitation admission coordinator and physician.   Past Medical History      Past Medical History:  Diagnosis Date   CHF (congestive heart failure) (HCC)     CKD (chronic kidney disease)     Heart murmur     Hypertension     Moderate mitral regurgitation      moderate to severe MR with moderate pulmonary HTN   Multinodular goiter     Nonischemic cardiomyopathy (HCC)      EF 30-35% by echo 2015   Pulmonary HTN (HCC)      moderate with PASP by echo 2015   PVC's (premature ventricular contractions)            Has the patient had major surgery during 100 days prior to admission? Yes   Family History  family history includes Breast cancer (age of onset: 75) in her sister; Breast cancer (age of onset: 10) in her mother; Heart disease in her father.     Current Medications   Current Medications    Current Facility-Administered Medications:    alteplase (CATHFLO ACTIVASE) injection 2 mg, 2 mg, Intracatheter, Once PRN, Simonne Martinet, NP   amiodarone (PACERONE) tablet 200 mg, 200 mg, Oral, Daily, Simonne Martinet, NP, 200 mg at 01/04/24 4098   anticoagulant  sodium citrate solution 5 mL, 5 mL, Intracatheter, PRN, Simonne Martinet, NP   artificial tears (LACRILUBE) ophthalmic ointment, , Both Eyes, Q4H PRN, Simonne Martinet, NP   ascorbic acid (VITAMIN C) tablet 250 mg, 250 mg, Oral, BID, Simonne Martinet, NP, 250 mg at 01/04/24 0848   bisacodyl (DULCOLAX) EC tablet 10 mg, 10 mg, Oral, Daily PRN, 10 mg at 12/28/23 1158 **OR** bisacodyl (DULCOLAX) suppository 10 mg, 10 mg, Rectal, Daily PRN, Simonne Martinet, NP   Chlorhexidine Gluconate Cloth 2 % PADS 6 each, 6 each, Topical, Q0600, Annie Sable, MD, 6 each at 01/04/24 0608   Darbepoetin Alfa (ARANESP) injection 100 mcg, 100 mcg, Subcutaneous, Q Tue-1800, Zenia Resides E, NP, 100 mcg at 12/28/23 1191   feeding supplement (ENSURE ENLIVE / ENSURE PLUS) liquid 237 mL, 237 mL, Oral, TID BM, Simonne Martinet, NP, 237 mL at 01/02/24 2234   feeding supplement (ENSURE ENLIVE / ENSURE PLUS) liquid 237 mL, 237 mL, Oral, TID PRN, Simonne Martinet, NP   Gerhardt's butt cream, , Topical, BID, Simonne Martinet, NP, Given at 01/04/24 0847   heparin sodium (porcine) injection 3,200 Units, 3,200 Units, Intravenous, Q dialysis, Annie Sable, MD, 3,200 Units at 01/03/24 1409   HYDROmorphone (DILAUDID) injection 0.5 mg, 0.5 mg, Intravenous, Q3H PRN, Simonne Martinet, NP, 0.5 mg at 12/21/23 2128   insulin aspart (novoLOG) injection 0-15 Units, 0-15 Units, Subcutaneous, TID WC, Osvaldo Shipper, MD, 3 Units at 01/04/24 1220   lanthanum (FOSRENOL) chewable tablet 500 mg, 500 mg, Oral, TID WC, Dagoberto Ligas, MD, 500 mg at 01/04/24 1301   levalbuterol (XOPENEX) nebulizer solution 0.63 mg, 0.63 mg, Nebulization, Q6H PRN, Simonne Martinet, NP, 0.63 mg at 12/02/23 0742   lidocaine (PF) (XYLOCAINE) 1 % injection 5 mL, 5 mL, Intradermal, PRN, Simonne Martinet, NP   lidocaine-prilocaine (EMLA) cream 1  Application, 1 Application, Topical, PRN, Simonne Martinet, NP   lip balm (CARMEX) ointment, , Topical, PRN, Simonne Martinet, NP, Given at 12/24/23 1028   melatonin tablet 3 mg, 3 mg, Oral, QHS PRN, Simonne Martinet, NP, 3 mg at 01/03/24 2137   metoCLOPramide (REGLAN) tablet 5 mg, 5 mg, Oral, Q8H PRN, Osvaldo Shipper, MD   midodrine (PROAMATINE) tablet 20 mg, 20 mg, Oral, Q8H, Simonne Martinet, NP, 20 mg at 01/04/24 1306   mirtazapine (REMERON) tablet 7.5 mg, 7.5 mg, Oral, QHS, Simonne Martinet, NP, 7.5 mg at 01/03/24 2140   multivitamin (RENA-VIT) tablet 1 tablet, 1 tablet, Oral, BID, Simonne Martinet, NP, 1 tablet at 01/04/24 0846   ondansetron [facility]) injection 4 mg, 4 mg, Intravenous, Q6H PRN, Simonne Martinet, NP, 4 mg at 12/31/23 1103   Oral care mouth rinse, 15 mL, Mouth Rinse, PRN, Simonne Martinet, NP   oxyCODONE (Oxy IR/ROXICODONE) immediate release tablet 5 mg, 5 mg, Oral, Q3H PRN, Simonne Martinet, NP   pentafluoroprop-tetrafluoroeth (GEBAUERS) aerosol 1 Application, 1 Application, Topical, PRN, Simonne Martinet, NP   pneumococcal 20-valent conjugate vaccine (PREVNAR 20) injection 0.5 mL, 0.5 mL, Intramuscular, Prior to discharge, Simonne Martinet, NP   polyethylene glycol (MIRALAX / GLYCOLAX) packet 17 g, 17 g, Oral, Daily, Simonne Martinet, NP, 17 g at 01/04/24 0846   QUEtiapine (SEROQUEL) tablet 25 mg, 25 mg, Oral, QHS, Simonne Martinet, NP, 25 mg at 01/03/24 2137   senna-docusate (Senokot-S) tablet 2 tablet, 2 tablet, Oral, BID, Osvaldo Shipper, MD, 2 tablet at 01/04/24 1301   sertraline (ZOLOFT) tablet 25 mg, 25 mg, Oral, Daily, Simonne Martinet, NP, 25 mg at 01/04/24 0846   sodium chloride (OCEAN) 0.65 % nasal spray 1 spray, 1 spray, Each Nare, PRN, Simonne Martinet, NP   sodium chloride flush (NS) 0.9 % injection 10-40 mL, 10-40 mL, Intracatheter, Q12H, Osvaldo Shipper, MD, 10 mL at 01/04/24 0848   sodium chloride flush (NS) 0.9 % injection 10-40 mL, 10-40 mL, Intracatheter, PRN, Osvaldo Shipper, MD   traZODone (DESYREL) tablet 100 mg, 100 mg, Oral, QHS PRN, Simonne Martinet, NP, 100 mg at  01/03/24 2136   warfarin (COUMADIN) tablet 2 mg, 2 mg, Oral, q1600, Cheri Fowler, MD, 2 mg at 01/03/24 1512   Warfarin - Pharmacist Dosing Inpatient, , Does not apply, q1600, Simonne Martinet, NP, 1 each at 01/01/24 1658     Patients Current Diet:  Diet Order                  Diet regular Room service appropriate? Yes; Fluid consistency: Thin  Diet effective now                         Precautions / Restrictions Precautions Precautions: Sternal, Fall, Other (comment) Precaution Booklet Issued: No Precaution Comments: Sternal precautions reviewed verbally with pt demonstrating good carryover of training from prior sessions this day (1/29) Restrictions Weight Bearing Restrictions Per Provider Order: Yes RUE Weight Bearing Per Provider Order: Non weight bearing LUE Weight Bearing Per Provider Order: Non weight bearing Other Position/Activity Restrictions: sternal precautions    Has the patient had 2 or more falls or a fall with injury in the past year?Yes   Prior Activity Level Community (5-7x/wk): Pt. was active in the community [facility]   Prior Functional Level Prior Function Prior Level of Function : Independent/Modified Independent, Driving   Self Care: Did the  patient need help bathing, dressing, using the toilet or eating?  Independent   Indoor Mobility: Did the patient need assistance with walking from room to room (with or without device)? Independent   Stairs: Did the patient need assistance with internal or external stairs (with or without device)? Independent   Functional Cognition: Did the patient need help planning regular tasks such as shopping or remembering to take medications? Independent   Patient Information Are you of Hispanic, Latino/a,or Spanish origin?: A. No, not of Hispanic, Latino/a, or Spanish origin What is your race?: B. Black or African American Do you need or want an interpreter to communicate with a doctor or health care staff?: 0. No    Patient's Response To:  Health Literacy and Transportation Is the patient able to respond to health literacy and transportation needs?: Yes Health Literacy - How often do you need to have someone help you when you read instructions, pamphlets, or other written material from your doctor or pharmacy?: Never In the past 12 months, has lack of transportation kept you from medical appointments or from getting medications?: No In the past 12 months, has lack of transportation kept you from meetings, work, or from getting things needed for daily living?: No   Home Assistive Devices / Equipment Home Equipment: None   Prior Device Use: Indicate devices/aids used by the patient prior to current illness, exacerbation or injury? None of the above   Current Functional Level Cognition   Overall Cognitive Status: Within Functional Limits for tasks assessed Current Attention Level: Selective Orientation Level: Oriented X4 Safety/Judgement: Decreased awareness of safety, Decreased awareness of deficits (noted improvements in awareness of deficits as compared to last skilled OT session) General Comments: Pt continues with fluctuating mood. Intermittently up and down today. Continues with difficulty maintaining sternal precautions    Extremity Assessment (includes Sensation/Coordination)   Upper Extremity Assessment: Generalized weakness (tested within sternal precautions)  Lower Extremity Assessment: Generalized weakness     ADLs   Overall ADL's : Needs assistance/impaired Eating/Feeding: Set up, Sitting Grooming: Set up, Sitting Grooming Details (indicate cue type and reason): Initially agreeable for ambulation to sink with encouragement but limited by low BPs Upper Body Bathing: Minimal assistance, Sitting, Cueing for compensatory techniques (adhering to sternal precautions) Upper Body Bathing Details (indicate cue type and reason): simulated sitting EOB Lower Body Bathing: Total assistance Upper  Body Dressing : Minimal assistance, Cueing for compensatory techniques, Sitting (adhering to sternal precautions) Lower Body Dressing: Total assistance, Bed level Lower Body Dressing Details (indicate cue type and reason): socks Toilet Transfer: Minimal assistance, +2 for physical assistance, +2 for safety/equipment Toilet Transfer Details (indicate cue type and reason): STS up to EVA and marched in place this session; limited by significant dizziness. Toileting- Clothing Manipulation and Hygiene: Total assistance, +2 for physical assistance, +2 for safety/equipment, Bed level Toileting - Clothing Manipulation Details (indicate cue type and reason): pericare at bed level Functional mobility during ADLs: Minimal assistance (EVA; STS and marching in place this session) General ADL Comments: OT educated pt in compensatory stratagies for increased safety and independence with UB ADLs while adhering to sternal precautions. Pt verbalized and demonstrated understanding through teach back. Pt will benefit from reinforcment of education. Pt conitnues to presetn with decreased activity tolerance, but with significant improvement in activity tolerance noted this session as compared to last skilled OT session.     Mobility   Overal bed mobility: Needs Assistance Bed Mobility: Supine to Sit Rolling: Mod assist Sidelying to sit: Min  assist, HOB elevated, +2 for safety/equipment Supine to sit: HOB elevated, Mod assist Sit to supine: Mod assist Sit to sidelying: Mod assist General bed mobility comments: pt able move LEs to EOB, due to inability to push/pull with UEs pt continue to require modA for trunk elevation and to scoot to EOB     Transfers   Overall transfer level: Needs assistance Equipment used: None Transfers: Sit to/from Stand Sit to Stand: Mod assist, +2 physical assistance, +2 safety/equipment, Max assist, From elevated surface Bed to/from chair/wheelchair/BSC transfer type:: Lateral/scoot  transfer (at EOB) Stand pivot transfers: Min assist, +2 safety/equipment, +2 physical assistance Step pivot transfers: Min assist, +2 safety/equipment, +2 physical assistance  Lateral/Scoot Transfers: Mod assist, Max assist General transfer comment: pt with fear of falling and resistant to leaning forward despite max encouragement and plus 2 help to power up, modAx2 to power up from very elevated EOB. Pt then attempted standing from recliner with 2 person Max A for 3 trials and unable to clear hips.     Ambulation / Gait / Stairs / Wheelchair Mobility   Ambulation/Gait Ambulation/Gait assistance: Min assist, +2 physical assistance, +2 safety/equipment Gait Distance (Feet): 40 Feet Assistive device: Fara Boros Gait Pattern/deviations: Trunk flexed, Step-to pattern, Decreased step length - right, Decreased step length - left, Decreased stride length General Gait Details: minA to control eva walker Gait velocity: decreased Gait velocity interpretation: <1.31 ft/sec, indicative of household ambulator     Posture / Balance Dynamic Sitting Balance Sitting balance - Comments: close Supervision for safety with pt sitting EOB but no LOB noted this session Balance Overall balance assessment: Needs assistance Sitting-balance support: Single extremity supported, No upper extremity supported, Feet supported Sitting balance-Leahy Scale: Good Sitting balance - Comments: close Supervision for safety with pt sitting EOB but no LOB noted this session Standing balance support: Single extremity supported, Bilateral upper extremity supported, Reliant on assistive device for balance Standing balance-Leahy Scale: Poor Standing balance comment: reliant on external support     Special needs/care consideration Dialysis: Hemodialysis Monday, Wednesday, and Friday and Skin          Previous Home Environment (from acute therapy documentation) Living Arrangements: Alone  Lives With: Other (Comment) Available  Help at Discharge: Family, Available 24 hours/day Type of Home: House Home Layout: One level Home Access: Level entry Bathroom Shower/Tub: Engineer, manufacturing systems: Standard Bathroom Accessibility: Yes How Accessible: Accessible via walker Home Care Services: No Additional Comments: mom will be able to assist at D/C   Discharge Living Setting Plans for Discharge Living Setting: Patient's home Type of Home at Discharge: House Discharge Home Layout: One level Discharge Home Access: Level entry Discharge Bathroom Shower/Tub: Tub/shower unit Discharge Bathroom Toilet: Standard Discharge Bathroom Accessibility: Yes How Accessible: Accessible via walker Does the patient have any problems obtaining your medications?: No   Social/Family/Support Systems Patient Roles: Other (Comment) Contact Information: Sister Anticipated Caregiver: Sister and mother coming to stay with pt. to provide 24/7 min A Caregiver Availability: 24/7 Discharge Plan Discussed with Primary Caregiver: Yes Is Caregiver In Agreement with Plan?: Yes Does Caregiver/Family have Issues with Lodging/Transportation while Pt is in Rehab?: No     Goals Patient/Family Goal for Rehab: PT/OT Min A to [facility] Expected length of stay: 12-14 days Pt/Family Agrees to Admission and willing to participate: Yes Program Orientation Provided & Reviewed with Pt/Caregiver Including Roles  & Responsibilities: Yes     Decrease burden of Care through IP rehab admission: not anticipated  Possible need for SNF placement upon discharge:not anticipated     Patient Condition: This patient's condition remains as documented in the consult dated 2/11,/25 in which the Rehabilitation Physician determined and documented that the patient's condition is appropriate for intensive rehabilitative care in an inpatient rehabilitation facility. Will admit to inpatient rehab today.   Preadmission Screen Completed By:  Jeronimo Greaves, CCC-SLP,  01/04/2024 2:38 PM ______________________________________________________________________   Discussed status with Dr. Carlis Abbott on 01/12/2024 at 322 and received approval for admission today.   Admission Coordinator:  Jeronimo Greaves, time 322/Date 01/12/24

## 2024-01-12 NOTE — Progress Notes (Signed)
Physical Medicine and Rehabilitation Consult Reason for Consult: Debility after multiple medical issues Referring Physician: Benjamine Mola     HPI: Vanessa [name] is a 73 y.o. female with a history of hypertension, nonischemic cardiomyopathy, CKD, severe mitral regurgitation.  Patient failed more conservative measures and ultimately underwent mitral valve replacement with tissue valve and tricuspid valve repair by Dr. Leafy Ro on 11/29/2023.  Hospital course was complicated by acute respiratory failure requiring intubation and acute on chronic kidney injury as well as cardiac tamponade and cardiogenic shock.  Patient currently is on dialysis Monday Wednesday and Friday.  She will require dialysis after discharge.  Patient initially on tube feedings but now has been advanced to a regular diet although p.o. intake has been poor.Marland Kitchen  She was up with therapy on Sunday and was mod assist to min assist for sit to stand transfers.  Patient needed reminders for sternal precautions as well as correct mechanics.  She has done some pre-gait activities with some steps at the edge of bed only.  Prior to this hospitalization patient lives alone in a 1 level house with level entry.  She was modified independent.  It appears she has family who can provide some support after discharge.     Review of Systems  Constitutional:  Negative for fever.  HENT: Negative.    Eyes: Negative.   Respiratory:  Positive for cough and shortness of breath.   Cardiovascular:  Positive for chest pain and leg swelling.  Gastrointestinal: Negative.   Genitourinary: Negative.   Musculoskeletal:  Positive for joint pain.  Skin:  Negative for rash.  Neurological:  Positive for weakness.  Psychiatric/Behavioral: Negative.          Past Medical History:  Diagnosis Date   CHF (congestive heart failure) (HCC)     CKD (chronic kidney disease)     Heart murmur     Hypertension     Moderate mitral regurgitation      moderate to severe MR with  moderate pulmonary HTN   Multinodular goiter     Nonischemic cardiomyopathy (HCC)      EF 30-35% by echo 2015   Pulmonary HTN (HCC)      moderate with PASP by echo 2015   PVC's (premature ventricular contractions)               Past Surgical History:  Procedure Laterality Date   CARDIAC CATHETERIZATION   2003    normal coronary arteries   CARDIAC CATHETERIZATION   2013  Nov    h/o nonischemic DCM EF 35-40% increase LVEDP    EXPLORATION POST OPERATIVE OPEN HEART N/A 11/29/2023    Procedure: EXPLORATION POST OPERATIVE OPEN HEART;  Surgeon: Eugenio Hoes, MD;  Location: [facility] OR;  Service: Open Heart Surgery;  Laterality: N/A;   HYSTEROSCOPY WITH D & C   12/02/2012    Procedure: DILATATION AND CURETTAGE /HYSTEROSCOPY;  Surgeon: Dorien Chihuahua. Richardson Dopp, MD;  Location: [facility] ORS;  Service: Gynecology;  Laterality: N/A;  cervical polypectomy   IR FLUORO GUIDE CV LINE LEFT   12/24/2023   IR FLUORO GUIDE CV LINE RIGHT   12/24/2023   IR US GUIDE VASC ACCESS LEFT   12/24/2023   IR US GUIDE VASC ACCESS RIGHT   12/24/2023   LEFT AND RIGHT HEART CATHETERIZATION WITH CORONARY ANGIOGRAM N/A 02/15/2015    Procedure: LEFT AND RIGHT HEART CATHETERIZATION WITH CORONARY ANGIOGRAM;  Surgeon: Corky Crafts, MD;  Location: [facility] CATH LAB;  Service: Cardiovascular;  Laterality: N/A;  MITRAL VALVE REPLACEMENT N/A 11/29/2023    Procedure: MITRAL VALVE (MV) REPLACEMENT USING MOSAIC 310CINCH TISSUE VALVE;  Surgeon: Eugenio Hoes, MD;  Location: [facility] OR;  Service: Open Heart Surgery;  Laterality: N/A;   RIGHT HEART CATH N/A 12/17/2023    Procedure: RIGHT HEART CATH;  Surgeon: Laurey Morale, MD;  Location: [facility] INVASIVE CV LAB;  Service: Cardiovascular;  Laterality: N/A;   RIGHT/LEFT HEART CATH AND CORONARY ANGIOGRAPHY N/A 10/12/2023    Procedure: RIGHT/LEFT HEART CATH AND CORONARY ANGIOGRAPHY;  Surgeon: Laurey Morale, MD;  Location: [facility] INVASIVE CV LAB;  Service: Cardiovascular;  Laterality: N/A;   TEE WITHOUT  CARDIOVERSION N/A 10/11/2018    Procedure: TRANSESOPHAGEAL ECHOCARDIOGRAM (TEE);  Surgeon: Laurey Morale, MD;  Location: [facility] ENDOSCOPY;  Service: Cardiovascular;  Laterality: N/A;   TEE WITHOUT CARDIOVERSION N/A 09/07/2023    Procedure: TRANSESOPHAGEAL ECHOCARDIOGRAM;  Surgeon: Laurey Morale, MD;  Location: [facility] INVASIVE CV LAB;  Service: Cardiovascular;  Laterality: N/A;   TEE WITHOUT CARDIOVERSION N/A 11/29/2023    Procedure: TRANSESOPHAGEAL ECHOCARDIOGRAM;  Surgeon: Eugenio Hoes, MD;  Location: [facility] OR;  Service: Open Heart Surgery;  Laterality: N/A;   TRANSCATHETER MITRAL EDGE TO EDGE REPAIR N/A 12/14/2018    Procedure: MITRAL VALVE REPAIR;  Surgeon: Tonny Bollman, MD;  Location: [facility] INVASIVE CV LAB;  Service: Cardiovascular;  Laterality: N/A;   TRICUSPID VALVE REPLACEMENT N/A 11/29/2023    Procedure: TRICUSPID VALVE REPAIR USING MC3 EDWARDS TRICUSPID ANNULOPLASTY RING;  Surgeon: Eugenio Hoes, MD;  Location: [facility] OR;  Service: Open Heart Surgery;  Laterality: N/A;             Family History  Problem Relation Age of Onset   Breast cancer Mother 24   Heart disease Father     Breast cancer Sister 21   Colon cancer Neg Hx     Esophageal cancer Neg Hx     Rectal cancer Neg Hx     Stomach cancer Neg Hx     Thyroid disease Neg Hx          Social History:  reports that she has never smoked. She has never used smokeless tobacco. She reports that she does not drink alcohol and does not use drugs. Allergies:  Allergies  No Known Allergies         Medications Prior to Admission  Medication Sig Dispense Refill   aspirin 81 MG EC tablet Take 1 tablet (81 mg total) by mouth daily.       Calcium Carb-Cholecalciferol (CALCIUM + VITAMIN D3 PO) Take 1 tablet by mouth in the morning.       carvedilol (COREG) 6.25 MG tablet Take 1 tablet by mouth twice daily 180 tablet 0   empagliflozin (JARDIANCE) 10 MG TABS tablet TAKE 1 TABLET BY MOUTH ONCE DAILY BEFORE BREAKFAST 90 tablet 3    ibandronate (BONIVA) 150 MG tablet Take 150 mg by mouth every 30 (thirty) days.       torsemide (DEMADEX) 20 MG tablet TAKE 2 TABLETS BY MOUTH ONCE DAILY ALTERNATING WITH TAKING 1 TABLET THE NEXT DAY 90 tablet 0   Vericiguat (VERQUVO) 2.5 MG TABS Take 1 tablet (2.5 mg total) by mouth daily. 30 tablet 11          Home: Home Living Family/patient expects to be discharged to:: Private residence Living Arrangements: Alone Available Help at Discharge: Family, Available 24 hours/day Type of Home: House Home Access: Level entry Home Layout: One level Bathroom Shower/Tub: Engineer, manufacturing systems:  Standard Bathroom Accessibility: Yes Home Equipment: None Additional Comments: mom will be able to assist at D/C  Lives With: Other (Comment)  Functional History: Prior Function Prior Level of Function : Independent/Modified Independent, Driving Functional Status:  Mobility: Bed Mobility Overal bed mobility: Needs Assistance Bed Mobility: Supine to Sit, Sit to Supine, Rolling Rolling: Min assist Sidelying to sit: Mod assist Supine to sit: Min assist Sit to supine: Mod assist Sit to sidelying: Mod assist General bed mobility comments: lifting help for trunk and A to scoot hips to EOB. Frequent reminders for sternal precautions. Transfers Overall transfer level: Needs assistance Equipment used: 1 person hand held assist, Rolling walker (2 wheels) Transfers: Sit to/from Stand Sit to Stand: Mod assist, Min assist Bed to/from chair/wheelchair/BSC transfer type:: Step pivot Stand pivot transfers: Min assist, +2 safety/equipment, +2 physical assistance Step pivot transfers: Min assist, Mod assist  Lateral/Scoot Transfers: Mod assist, Max assist General transfer comment: worked on sit to stand this session 3x sit to stand from EOB with verbal cues and demonstration for correct body mechanics with sit to stand to decrease need for physical assistance. Improvement from mod to Min A without  use of UE. Frequent remindes for sternal precautions initially and had RW in front of her. Pt improved when RW was removed from in front of her and pt therapist stood in front of patient to facilitate forward trunk progression. Ambulation/Gait Ambulation/Gait assistance: +2 safety/equipment, Min assist Gait Distance (Feet): 30 Feet Assistive device: Rolling walker (2 wheels) Gait Pattern/deviations: Step-to pattern, Step-through pattern, Decreased stride length, Shuffle, Trunk flexed General Gait Details: did not progress due to fatigue after practicing sit to stand. Gait velocity: decreased Gait velocity interpretation: <1.31 ft/sec, indicative of household ambulator Pre-gait activities: took side steps at EOB with RW with good movement bil of LE for stepping toward [facility] and navigation of RW   ADL: ADL Overall ADL's : Needs assistance/impaired Eating/Feeding: Set up, Sitting Grooming: Set up, Sitting Grooming Details (indicate cue type and reason): Initially agreeable for ambulation to sink with encouragement but limited by low BPs Upper Body Bathing: Minimal assistance, Sitting, Cueing for compensatory techniques (adhering to sternal precautions) Upper Body Bathing Details (indicate cue type and reason): simulated sitting EOB Lower Body Bathing: Total assistance Upper Body Dressing : Contact guard assist, Sitting, Cueing for compensatory techniques (adhering to sternal precautions) Lower Body Dressing: Maximal assistance, Sit to/from stand, Sitting/lateral leans, +2 for safety/equipment Lower Body Dressing Details (indicate cue type and reason): socks Toilet Transfer: Minimal assistance, Moderate assistance, BSC/3in1 (hand held assist +1; step-pivot) Toilet Transfer Details (indicate cue type and reason): Mods assist to power up then Min assist once in standing Toileting- Clothing Manipulation and Hygiene: Maximal assistance, +2 for safety/equipment, Sit to/from stand Toileting - Clothing  Manipulation Details (indicate cue type and reason): pericare at bed level Functional mobility during ADLs: Minimal assistance (EVA; STS and marching in place this session) General ADL Comments: pt continues to present with decreased activity tolerance, but with noted improvements in activity tolerance as compared to last skilled OT session   Cognition: Cognition Overall Cognitive Status: Within Functional Limits for tasks assessed Orientation Level: Oriented X4 Cognition Arousal: Alert Behavior During Therapy: WFL for tasks assessed/performed, Flat affect Overall Cognitive Status: Within Functional Limits for tasks assessed Area of Impairment: Problem solving, Awareness, Safety/judgement, Attention Current Attention Level: Selective Memory: Decreased recall of precautions Safety/Judgement: Decreased awareness of safety, Decreased awareness of deficits Awareness: Emergent Problem Solving: Slow processing General Comments: AAOx4 and pleasant throughout  session.   Blood pressure 95/60, pulse 78, temperature 98.2 F (36.8 C), temperature source Oral, resp. rate 19, height 5\' 3"  (1.6 m), weight 68.8 kg, SpO2 97%. Physical Exam HENT:     Head: Normocephalic.     Right Ear: External ear normal.     Left Ear: External ear normal.     Nose: Nose normal.     Mouth/Throat:     Mouth: Mucous membranes are moist.  Eyes:     Extraocular Movements: Extraocular movements intact.     Pupils: Pupils are equal, round, and reactive to light.  Cardiovascular:     Rate and Rhythm: Normal rate.  Pulmonary:     Effort: Pulmonary effort is normal.  Abdominal:     Palpations: Abdomen is soft.  Musculoskeletal:     Cervical back: Normal range of motion.  Skin:    Comments: Sternal incision CDI  Neurological:     Mental Status: She is alert.     Comments: Alert and oriented x 3. Normal insight and awareness. Intact Memory. Normal language and speech. Cranial nerve exam unremarkable. MMT: UE motor  4/5 prox to distal. LE 4-5 HF, 4/5 KE and 4+/5 ADF/PF. Sensory exam normal for light touch and pain in all 4 limbs. No limb ataxia or cerebellar signs. No abnormal tone appreciated.  Marland Kitchen    Psychiatric:        Mood and Affect: Mood normal.        Behavior: Behavior normal.        Lab Results Last 24 Hours       Results for orders placed or performed during the hospital encounter of 11/29/23 (from the past 24 hours)  Glucose, capillary     Status: Abnormal    Collection Time: 01/10/24 12:52 PM  Result Value Ref Range    Glucose-Capillary 130 (H) 70 - 99 mg/dL    Comment 1 Notify RN      Comment 2 Document in Chart    Renal function panel     Status: Abnormal    Collection Time: 01/10/24  1:52 PM  Result Value Ref Range    Sodium 133 (L) 135 - 145 mmol/L    Potassium 4.3 3.5 - 5.1 mmol/L    Chloride 91 (L) 98 - 111 mmol/L    CO2 22 22 - 32 mmol/L    Glucose, Bld 113 (H) 70 - 99 mg/dL    BUN 59 (H) 8 - 23 mg/dL    Creatinine, Ser 4.09 (H) 0.44 - 1.00 mg/dL    Calcium 9.6 8.9 - 81.1 mg/dL    Phosphorus 5.3 (H) 2.5 - 4.6 mg/dL    Albumin 3.0 (L) 3.5 - 5.0 g/dL    GFR, Estimated 6 (L) >60 mL/min    Anion gap 20 (H) 5 - 15  Glucose, capillary     Status: Abnormal    Collection Time: 01/10/24  2:20 PM  Result Value Ref Range    Glucose-Capillary 119 (H) 70 - 99 mg/dL    Comment 1 Repeat Test    Glucose, capillary     Status: Abnormal    Collection Time: 01/10/24  9:43 PM  Result Value Ref Range    Glucose-Capillary 170 (H) 70 - 99 mg/dL  Magnesium     Status: None    Collection Time: 01/11/24  5:05 AM  Result Value Ref Range    Magnesium 2.1 1.7 - 2.4 mg/dL  Protime-INR     Status: Abnormal  Collection Time: 01/11/24  5:05 AM  Result Value Ref Range    Prothrombin Time 26.8 (H) 11.4 - 15.2 seconds    INR 2.5 (H) 0.8 - 1.2  Glucose, capillary     Status: Abnormal    Collection Time: 01/11/24  6:17 AM  Result Value Ref Range    Glucose-Capillary 118 (H) 70 - 99 mg/dL       Imaging Results (Last 48 hours)  No results found.     Assessment/Plan: Diagnosis: 73 year old female with debility after mitral valve replacement and tricuspid valve repair. Does the need for close, 24 hr/day medical supervision in concert with the patient's rehab needs make it unreasonable for this patient to be served in a less intensive setting? Yes -sternal precautions   Co-Morbidities requiring supervision/potential complications:  -Acute on chronic kidney failure now on hemodialysis- -paroxysmal atrial fibrillation -Situational depression -Poor p.o. intake/nutritional considerations -Slow transit constipation Due to bladder management, bowel management, safety, skin/wound care, disease management, medication administration, pain management, and patient education, does the patient require 24 hr/day rehab nursing? Yes Does the patient require coordinated care of a physician, rehab nurse, therapy disciplines of PT, OT to address physical and functional deficits in the context of the above medical diagnosis(es)? Yes Addressing deficits in the following areas: balance, endurance, locomotion, strength, transferring, bowel/bladder control, bathing, dressing, feeding, grooming, toileting, and psychosocial support Can the patient actively participate in an intensive therapy program of at least 3 hrs of therapy per day at least 5 days per week? Yes The potential for patient to make measurable gains while on inpatient rehab is excellent Anticipated functional outcomes upon discharge from inpatient rehab are modified independent  with PT, modified independent and supervision with OT, n/a with SLP. Estimated rehab length of stay to reach the above functional goals is: 8-11 days Anticipated discharge destination: Home Overall Rehab/Functional Prognosis: excellent   POST ACUTE RECOMMENDATIONS: This patient's condition is appropriate for continued rehabilitative care in the following setting:  CIR Patient has agreed to participate in recommended program. Yes Note that insurance prior authorization may be required for reimbursement for recommended care.   Comment: Vanessa [name] is extremely motivated to regain independence. Wants to be able to climb 2 steps to enter through the kitchen which is her usual entry spot. Has family who can support. Rehab Admissions Coordinator to follow up.

## 2024-01-12 NOTE — Discharge Summary (Signed)
Physician Discharge Summary  Vanessa [name] NWG:[id number] DOB: 1951-09-13 DOA: 11/29/2023  PCP: Milus Height, PA  Admit date: 11/29/2023 Discharge date: 01/12/2024  Time spent: 65 minutes  Recommendations for Outpatient Follow-up:  Discharged to inpatient rehab.  Patient will follow-up with MD at inpatient rehab. Follow-up with [facility]. Outpatient follow-up with [facility] imaging.   Discharge Diagnoses:  Principal Problem:   S/P mitral valve replacement Active Problems:   [facility])   Physical deconditioning   Acute renal failure superimposed on stage 3 chronic kidney disease (HCC)   Reactive depression   Pulmonary hypertension (HCC)   Hypotension due to hypovolemia   Insomnia   Anemia   Severe protein-energy malnutrition (HCC)   Idiopathic hypotension   Tricuspid valve insufficiency   ESRD on dialysis [facility])   Discharge Condition: Stable and improved.  Diet recommendation: Heart healthy  Filed Weights   01/12/24 0511 01/12/24 0841 01/12/24 1230  Weight: 70.1 kg 69.9 kg 66.7 kg    History of present illness:  HPI per Dr. Leafy [name] Pt is a very pleasant 73 yo female who has had a long history of mv disease. Pt was found to have non ischemic cardiomyopathy and severe MR and underwent Mitral Clip x2 in 2020. Pt over the years has had follow up with increasing MR and EF 40-45%. She has been suffering from exertional DOE mostly with climbing stairs and on inclines. She has lower ext edema when she becomes fluid overloaded and has been on GDMT adjustments due to side effects. Recently with increasing DOE she underwent TEE to determine if additional Mitral clip placement would be an option and unfortunately felt not to be a candidate. She underwent cath with no CAD but with moderate PHTN. She has not had atrial fibrillation issues. Pt very anxious to proceed as soon as possible to "get this behind her"   Hospital Course:  Postcardiotomy cardiogenic  and septic shock She is status post mitral and tricuspid valve replacement. She was in the ICU due to worsening clinical status.  She was intubated.  Subsequently she was extubated.  Stabilized and then transferred to the floor. Patient currently hemodynamically stable.   -Outpatient follow-up with cardiothoracic surgery.     Acute kidney injury/hyperkalemia Followed by nephrology.  It appears that she has progressed to end-stage disease.  She required CRRT in the ICU.   Nephrology started patient on dialysis on a Monday Wednesday Friday schedule.  -Patient be discharged to inpatient rehab, nephrology informed of discharge to inpatient rehab and will follow patient there for ongoing dialysis needs.     Chronic systolic CHF/pulmonary hypertension/complete heart block EF is 40 to 45%.  Followed by heart failure team.  Low blood pressure has been limiting use of GDMT.  She also has AKI.  Volume status being managed with hemodialysis.  She was on CRRT previously. -Patient placed on midodrine Complete heart block was transient and has resolved. -Will need outpatient follow-up with cardiology.   Acute respiratory failure with hypoxia -weaned to RA -Resolved.   Mitral regurgitation Status post MitraClip Perinal mitral valve replacement.  Currently on warfarin. -Outpatient follow-up with cardiothoracic surgery.   Ventricular ectopy Patient maintained on amiodarone.   Paroxysmal atrial fibrillation/atrial flutter Stable.  Monitor on telemetry.  Noted to be on amiodarone. Anticoagulated with warfarin. -Outpatient follow-up.   Macrocytic anemia Required blood transfusion on 1/27.  Hemoglobin stabilized at 11.8.  -Patient with no evidence of overt bleeding.   History of goiter Off of methimazole.  TSH was normal.  Outpatient follow-up with endocrinology.   Hyperglycemia HbA1c 6.  Patient maintained on SSI.   Situational depression/sleep disturbance Patient maintained on Zoloft,  Seroquel, melatonin and mirtazapine. -Outpatient follow-up.   Deep tissue injury left buttock Wound care consulted and recommendations made. Pressure Injury Buttocks Left Deep Tissue Pressure Injury - Purple or maroon localized area of discolored intact skin or blood-filled blister due to damage of underlying soft tissue from pressure and/or shear. Purple DTI 2cm round (Active)     Location: Buttocks  Location Orientation: Left  Staging: Deep Tissue Pressure Injury - Purple or maroon localized area of discolored intact skin or blood-filled blister due to damage of underlying soft tissue from pressure and/or shear.  Wound Description (Comments): Purple DTI 2cm round  Present on Admission: No    . -   Poor oral intake She had a cortrack in the ICU which had to be removed since it got clogged. -constipation could be contributing-- aggressive bowel regimen -Patient subsequently started on a diet which she is tolerating. -Outpatient follow-up.      Procedures: -Atrial valve replacement with a Mosaic porcine 31 mm tissue prosthesis/tricuspid valve repair with a 30 mm MC 3 annuloplasty ring: Per cardiothoracic surgery: Dr.Weldner 11/29/2023 -Reexploration for bleeding per cardiothoracic surgery: Dr.Weldner 11/29/2023  Significant Hospital Events:  12/30 MVR by Dr. Leafy [name]; relook for tamponade/washout later in evening without obvious source 1/2 Intubated for respiratory decompensation, iNO started. Vanc, meropenem started. CRRT started.  1/5 Extubated 1/8 Converted to NSR overnight 1/9 Back to A.fib around 0200 1/11 R introducer out.  Unable to thread right subclavian catheter 1/12 Remains weak still pressor dependent 1/13 Repeat BC sent. Still on pressors, Zosyn changed to meropenem.  Chest abdomen and pelvis CT obtained : Moderate hemorrhagic pericardial effusion, 5.3 cm hematoma right thrombus. Arterial gas bilateral patchy opacities mild dependent posterior right upper lobe  atelectasis was unremarkable layering gallstones; neg for tamponade on repeat Echo 1/14 New right IJ HD catheter placed. Echo obtained to better evaluate her EF is 35 to 40%.  The LV demonstrates regional wall motion abnormalities there is asymmetric left ventricular hypertrophy of the inferior lateral segment RV systolic function moderate regular reduced left atrial wall dilated no mention of clot.  1/15 Still on fairly high-dose norepinephrine 1/16 Cont CRRT + low dose NE  1/17 RHC confirms low output state, restarted on milrinone 1/20 CRRT stopped 1/21: D/c milrinone yesterday, ongoing increase NE requirements during nights. Started droxidopa in addition to midodrine to aid off NE.  1/22: Levo up to 18 overnight (max so far), cvp 9, restarting crrt today. Received dose methylene blue, Cortisol 21 1/23: Levo down to 9, CVP 8 1/24: Tunneled HD and tunneled dual lumen LIJ CVC placed  1/27: 1U PRBC given for Hgb 7.2 and hypotension. Off levo.  1/29: CRRT stopped. Cortrak clogged, removed. 1/31 tolerated iHD  Consultations: PCCM: Dr. Katrinka Blazing 11/29/2023 Cardiology: Dr. Shirlee Latch 11/29/2023 Nephrology: Dr. Valentino Nose 12/02/2023 Interventional radiology: Dr. Elby Showers 12/22/2023 Inpatient rehab  Discharge Exam: Vitals:   01/12/24 1308 01/12/24 1533  BP: 94/73 99/75  Pulse: 100 98  Resp: 19 19  Temp: 98.1 F (36.7 C) 98.1 F (36.7 C)  SpO2: 93% 95%    General: NAD Cardiovascular: RRR no murmurs rubs or gallops.  No JVD.  No pitting lower extremity edema. Respiratory: Clear to auscultation bilaterally anterior lung fields.  No wheezes, no crackles, no rhonchi.  Fair air movement.  Speaking in full sentences.  Discharge Instructions  Discharge Instructions     Amb Referral to Cardiac Rehabilitation   Complete by: As directed    Diagnosis: Valve Replacement   Valve: Mitral   After initial evaluation and assessments completed: Virtual Based Care may be provided alone or in conjunction with  Phase 2 Cardiac Rehab based on patient barriers.: Yes   Intensive Cardiac Rehabilitation (ICR) [facility] location only OR Traditional Cardiac Rehabilitation (TCR) *If criteria for ICR are not met will enroll in TCR [facility] only): Yes   Diet - low sodium heart healthy   Complete by: As directed    Discharge wound care:   Complete by: As directed    Pressure Injury Buttocks Left Deep Tissue Pressure Injury - Purple or maroon localized area of discolored intact skin or blood-filled blister due to damage of underlying soft tissue from pressure and/or shear. Purple DTI 2cm round -- days      Wound Care Orders (From admission, onward)      Start     Ordered   12/01/23 0800    After Sternal [place] dressing removed, paint sternal wound with Betadine BID and leave open to air  2 times daily   Increase activity slowly   Complete by: As directed       Allergies as of 01/12/2024   No Known Allergies      Medication List     STOP taking these medications    aspirin EC 81 MG tablet   carvedilol 6.25 MG tablet Commonly known as: COREG   torsemide 20 MG tablet Commonly known as: DEMADEX   Verquvo 2.5 MG Tabs Generic drug: Vericiguat       TAKE these medications    amiodarone 200 MG tablet Commonly known as: PACERONE Take 1 tablet (200 mg total) by mouth daily.   ascorbic acid 250 MG tablet Commonly known as: VITAMIN C Take 1 tablet (250 mg total) by mouth 2 (two) times daily.   CALCIUM + VITAMIN D3 PO Take 1 tablet by mouth in the morning.   feeding supplement Liqd Take 237 mLs by mouth 3 (three) times daily as needed (As requested, pt has been encourage to sip on Ensure all throughout the day, take with meds.).   Gerhardt's butt cream Crea Apply 1 Application topically 2 (two) times daily. Apply to periarea   ibandronate 150 MG tablet Commonly known as: BONIVA Take 150 mg by mouth every 30 (thirty) days.   Jardiance 10 MG Tabs tablet Generic drug: empagliflozin TAKE 1  TABLET BY MOUTH ONCE DAILY BEFORE BREAKFAST   lanthanum 500 MG chewable tablet Commonly known as: FOSRENOL Chew 1 tablet (500 mg total) by mouth 3 (three) times daily with meals.   levalbuterol 0.63 MG/3ML nebulizer solution Commonly known as: XOPENEX Take 3 mLs (0.63 mg total) by nebulization every 6 (six) hours as needed for wheezing or shortness of breath.   metoCLOPramide 5 MG tablet Commonly known as: REGLAN Take 1 tablet (5 mg total) by mouth every 8 (eight) hours as needed for refractory nausea / vomiting.   midodrine 10 MG tablet Commonly known as: PROAMATINE Take 2 tablets (20 mg total) by mouth 3 (three) times daily with meals.   mirtazapine 7.5 MG tablet Commonly known as: REMERON Take 1 tablet (7.5 mg total) by mouth at bedtime.   multivitamin Tabs tablet Take 1 tablet by mouth 2 (two) times daily.   oxyCODONE 5 MG immediate release tablet Commonly known as: Oxy IR/ROXICODONE Take 1 tablet (5 mg total) by mouth every  3 (three) hours as needed for severe pain (pain score 7-10).   polyethylene glycol 17 g packet Commonly known as: MIRALAX / GLYCOLAX Take 17 g by mouth daily. Start taking on: January 13, 2024   QUEtiapine 25 MG tablet Commonly known as: SEROQUEL Take 1 tablet (25 mg total) by mouth at bedtime.   senna-docusate 8.6-50 MG tablet Commonly known as: Senokot-S Take 2 tablets by mouth 2 (two) times daily.   sertraline 25 MG tablet Commonly known as: ZOLOFT Take 1 tablet (25 mg total) by mouth daily.   traZODone 100 MG tablet Commonly known as: DESYREL Take 1 tablet (100 mg total) by mouth at bedtime as needed for sleep (2nd line).   warfarin 3 MG tablet Commonly known as: COUMADIN Take 1 tablet (3 mg total) by mouth daily at 4 PM.               Discharge Care Instructions  (From admission, onward)           Start     Ordered   01/12/24 0000  Discharge wound care:       Comments: Pressure Injury Buttocks Left Deep Tissue  Pressure Injury - Purple or maroon localized area of discolored intact skin or blood-filled blister due to damage of underlying soft tissue from pressure and/or shear. Purple DTI 2cm round -- days      Wound Care Orders (From admission, onward)      Start     Ordered   12/01/23 0800    After Sternal [place] dressing removed, paint sternal wound with Betadine BID and leave open to air  2 times daily   01/12/24 1541           No Known Allergies  Follow-up Information     [facility] up.   Specialty: Cardiothoracic Surgery Contact information: [address] [place], [address] [place] [address] [phone]        [facility] .   Contact information: [address] [place] [address]        MD at CIR Follow up.                   The results of significant diagnostics from this hospitalization (including imaging, microbiology, ancillary and laboratory) are listed below for reference.    Significant Diagnostic Studies: IR US Guide Vasc Access Left Result Date: 12/24/2023 INDICATION: Poor venous access. In need of durable intravenous access for continuation of hemodialysis. Additionally, patient in need of durable intravenous access for continued medication administration and blood draws. EXAM: 1. TUNNELED DUAL-LUMEN CENTRAL VENOUS CATHETER PLACEMENT WITH ULTRASOUND AND FLUOROSCOPIC GUIDANCE 2. TUNNELED CENTRAL VENOUS HEMODIALYSIS CATHETER PLACEMENT WITH ULTRASOUND AND FLUOROSCOPIC GUIDANCE MEDICATIONS: Ancef 2 gm IV. The antibiotic was given in an appropriate time interval prior to skin puncture. ANESTHESIA/SEDATION: Moderate (conscious) sedation was employed during this procedure. A total of Versed 1.5 mg was administered intravenously. Moderate Sedation Time: 30 minutes. The patient's level of consciousness and vital signs were monitored continuously by radiology nursing  throughout the procedure under my direct supervision. FLUOROSCOPY TIME:  3 minutes, 18 seconds (34 mGy) COMPLICATIONS: None immediate. PROCEDURE: Informed written consent was obtained from the patient after a discussion of the risks, benefits, and alternatives to treatment. Questions regarding the procedure were encouraged and answered. Attention was first paid towards placement of a tunneled central venous catheter. Left neck and chest were prepped and draped in usual sterile fashion. Under  direct ultrasound guidance, the left internal jugular vein was accessed with a micropuncture kit after the overlying soft tissues were anesthetized with 1% lidocaine with epinephrine. This allowed for placement of a peel-away sheath. The microwire was utilized for measurement purposes and a 27 cm dual lumen cuffed central venous catheter was tunneled from the left anterior chest to the venotomy site and inserted through the peel-away sheath with tip ultimately terminating within the superior cavoatrial junction. Postprocedural spot fluoroscopic image was obtained. The catheter was secured at the exit site within interrupted 0 Prolene suture and the venotomy site was closed with Dermabond and Steri-Strips. Attention was now paid towards placement of a tunneled hemodialysis catheter. The right neck and chest were prepped with chlorhexidine in a sterile fashion, and a sterile drape was applied covering the operative field. Maximum barrier sterile technique with sterile gowns and gloves were used for the procedure. A timeout was performed prior to the initiation of the procedure. After creating a small venotomy incision, a micropuncture kit was utilized to access the internal jugular vein. Real-time ultrasound guidance was utilized for vascular access including the acquisition of a permanent ultrasound image documenting patency of the accessed vessel. The microwire was utilized to measure appropriate catheter length. A stiff  Glidewire was advanced to the level of the IVC and the micropuncture sheath was exchanged for a peel-away sheath. A palindrome tunneled hemodialysis catheter measuring 19 cm from tip to cuff was tunneled in a retrograde fashion from the anterior chest wall to the venotomy incision. The catheter was then placed through the peel-away sheath with tips ultimately positioned within the superior aspect of the right atrium. Final catheter positioning was confirmed and documented with a spot radiographic image. The catheter aspirates and flushes normally. The catheter was flushed with appropriate volume heparin dwells. The catheter exit site was secured with a 0-Prolene retention suture. The venotomy incision was closed with Dermabond and Steri-strips. Dressings were applied. The patient tolerated the procedure well without immediate post procedural complication. IMPRESSION: 1. Successful placement a 27 cm tunneled dual-lumen central venous catheter via the left internal jugular vein with tip terminating within the superior cavoatrial junction. The catheter is ready for immediate usage. 2. Successful placement of 19 cm tip to cuff tunneled hemodialysis catheter via the right internal jugular vein with tips terminating within the superior aspect of the right atrium. The catheter is ready for immediate use. Electronically Signed   By: Simonne Come M.D.   On: 12/24/2023 17:07   IR Fluoro Guide CV Line Left Result Date: 12/24/2023 INDICATION: Poor venous access. In need of durable intravenous access for continuation of hemodialysis. Additionally, patient in need of durable intravenous access for continued medication administration and blood draws. EXAM: 1. TUNNELED DUAL-LUMEN CENTRAL VENOUS CATHETER PLACEMENT WITH ULTRASOUND AND FLUOROSCOPIC GUIDANCE 2. TUNNELED CENTRAL VENOUS HEMODIALYSIS CATHETER PLACEMENT WITH ULTRASOUND AND FLUOROSCOPIC GUIDANCE MEDICATIONS: Ancef 2 gm IV. The antibiotic was given in an appropriate time  interval prior to skin puncture. ANESTHESIA/SEDATION: Moderate (conscious) sedation was employed during this procedure. A total of Versed 1.5 mg was administered intravenously. Moderate Sedation Time: 30 minutes. The patient's level of consciousness and vital signs were monitored continuously by radiology nursing throughout the procedure under my direct supervision. FLUOROSCOPY TIME:  3 minutes, 18 seconds (34 mGy) COMPLICATIONS: None immediate. PROCEDURE: Informed written consent was obtained from the patient after a discussion of the risks, benefits, and alternatives to treatment. Questions regarding the procedure were encouraged and answered. Attention was first paid towards  placement of a tunneled central venous catheter. Left neck and chest were prepped and draped in usual sterile fashion. Under direct ultrasound guidance, the left internal jugular vein was accessed with a micropuncture kit after the overlying soft tissues were anesthetized with 1% lidocaine with epinephrine. This allowed for placement of a peel-away sheath. The microwire was utilized for measurement purposes and a 27 cm dual lumen cuffed central venous catheter was tunneled from the left anterior chest to the venotomy site and inserted through the peel-away sheath with tip ultimately terminating within the superior cavoatrial junction. Postprocedural spot fluoroscopic image was obtained. The catheter was secured at the exit site within interrupted 0 Prolene suture and the venotomy site was closed with Dermabond and Steri-Strips. Attention was now paid towards placement of a tunneled hemodialysis catheter. The right neck and chest were prepped with chlorhexidine in a sterile fashion, and a sterile drape was applied covering the operative field. Maximum barrier sterile technique with sterile gowns and gloves were used for the procedure. A timeout was performed prior to the initiation of the procedure. After creating a small venotomy incision, a  micropuncture kit was utilized to access the internal jugular vein. Real-time ultrasound guidance was utilized for vascular access including the acquisition of a permanent ultrasound image documenting patency of the accessed vessel. The microwire was utilized to measure appropriate catheter length. A stiff Glidewire was advanced to the level of the IVC and the micropuncture sheath was exchanged for a peel-away sheath. A palindrome tunneled hemodialysis catheter measuring 19 cm from tip to cuff was tunneled in a retrograde fashion from the anterior chest wall to the venotomy incision. The catheter was then placed through the peel-away sheath with tips ultimately positioned within the superior aspect of the right atrium. Final catheter positioning was confirmed and documented with a spot radiographic image. The catheter aspirates and flushes normally. The catheter was flushed with appropriate volume heparin dwells. The catheter exit site was secured with a 0-Prolene retention suture. The venotomy incision was closed with Dermabond and Steri-strips. Dressings were applied. The patient tolerated the procedure well without immediate post procedural complication. IMPRESSION: 1. Successful placement a 27 cm tunneled dual-lumen central venous catheter via the left internal jugular vein with tip terminating within the superior cavoatrial junction. The catheter is ready for immediate usage. 2. Successful placement of 19 cm tip to cuff tunneled hemodialysis catheter via the right internal jugular vein with tips terminating within the superior aspect of the right atrium. The catheter is ready for immediate use. Electronically Signed   By: Simonne Come M.D.   On: 12/24/2023 17:07   IR US Guide Vasc Access Right Result Date: 12/24/2023 INDICATION: Poor venous access. In need of durable intravenous access for continuation of hemodialysis. Additionally, patient in need of durable intravenous access for continued medication  administration and blood draws. EXAM: 1. TUNNELED DUAL-LUMEN CENTRAL VENOUS CATHETER PLACEMENT WITH ULTRASOUND AND FLUOROSCOPIC GUIDANCE 2. TUNNELED CENTRAL VENOUS HEMODIALYSIS CATHETER PLACEMENT WITH ULTRASOUND AND FLUOROSCOPIC GUIDANCE MEDICATIONS: Ancef 2 gm IV. The antibiotic was given in an appropriate time interval prior to skin puncture. ANESTHESIA/SEDATION: Moderate (conscious) sedation was employed during this procedure. A total of Versed 1.5 mg was administered intravenously. Moderate Sedation Time: 30 minutes. The patient's level of consciousness and vital signs were monitored continuously by radiology nursing throughout the procedure under my direct supervision. FLUOROSCOPY TIME:  3 minutes, 18 seconds (34 mGy) COMPLICATIONS: None immediate. PROCEDURE: Informed written consent was obtained from the patient after a discussion of  the risks, benefits, and alternatives to treatment. Questions regarding the procedure were encouraged and answered. Attention was first paid towards placement of a tunneled central venous catheter. Left neck and chest were prepped and draped in usual sterile fashion. Under direct ultrasound guidance, the left internal jugular vein was accessed with a micropuncture kit after the overlying soft tissues were anesthetized with 1% lidocaine with epinephrine. This allowed for placement of a peel-away sheath. The microwire was utilized for measurement purposes and a 27 cm dual lumen cuffed central venous catheter was tunneled from the left anterior chest to the venotomy site and inserted through the peel-away sheath with tip ultimately terminating within the superior cavoatrial junction. Postprocedural spot fluoroscopic image was obtained. The catheter was secured at the exit site within interrupted 0 Prolene suture and the venotomy site was closed with Dermabond and Steri-Strips. Attention was now paid towards placement of a tunneled hemodialysis catheter. The right neck and chest were  prepped with chlorhexidine in a sterile fashion, and a sterile drape was applied covering the operative field. Maximum barrier sterile technique with sterile gowns and gloves were used for the procedure. A timeout was performed prior to the initiation of the procedure. After creating a small venotomy incision, a micropuncture kit was utilized to access the internal jugular vein. Real-time ultrasound guidance was utilized for vascular access including the acquisition of a permanent ultrasound image documenting patency of the accessed vessel. The microwire was utilized to measure appropriate catheter length. A stiff Glidewire was advanced to the level of the IVC and the micropuncture sheath was exchanged for a peel-away sheath. A palindrome tunneled hemodialysis catheter measuring 19 cm from tip to cuff was tunneled in a retrograde fashion from the anterior chest wall to the venotomy incision. The catheter was then placed through the peel-away sheath with tips ultimately positioned within the superior aspect of the right atrium. Final catheter positioning was confirmed and documented with a spot radiographic image. The catheter aspirates and flushes normally. The catheter was flushed with appropriate volume heparin dwells. The catheter exit site was secured with a 0-Prolene retention suture. The venotomy incision was closed with Dermabond and Steri-strips. Dressings were applied. The patient tolerated the procedure well without immediate post procedural complication. IMPRESSION: 1. Successful placement a 27 cm tunneled dual-lumen central venous catheter via the left internal jugular vein with tip terminating within the superior cavoatrial junction. The catheter is ready for immediate usage. 2. Successful placement of 19 cm tip to cuff tunneled hemodialysis catheter via the right internal jugular vein with tips terminating within the superior aspect of the right atrium. The catheter is ready for immediate use.  Electronically Signed   By: Simonne Come M.D.   On: 12/24/2023 17:07   IR Fluoro Guide CV Line Right Result Date: 12/24/2023 INDICATION: Poor venous access. In need of durable intravenous access for continuation of hemodialysis. Additionally, patient in need of durable intravenous access for continued medication administration and blood draws. EXAM: 1. TUNNELED DUAL-LUMEN CENTRAL VENOUS CATHETER PLACEMENT WITH ULTRASOUND AND FLUOROSCOPIC GUIDANCE 2. TUNNELED CENTRAL VENOUS HEMODIALYSIS CATHETER PLACEMENT WITH ULTRASOUND AND FLUOROSCOPIC GUIDANCE MEDICATIONS: Ancef 2 gm IV. The antibiotic was given in an appropriate time interval prior to skin puncture. ANESTHESIA/SEDATION: Moderate (conscious) sedation was employed during this procedure. A total of Versed 1.5 mg was administered intravenously. Moderate Sedation Time: 30 minutes. The patient's level of consciousness and vital signs were monitored continuously by radiology nursing throughout the procedure under my direct supervision. FLUOROSCOPY TIME:  3 minutes,  18 seconds (34 mGy) COMPLICATIONS: None immediate. PROCEDURE: Informed written consent was obtained from the patient after a discussion of the risks, benefits, and alternatives to treatment. Questions regarding the procedure were encouraged and answered. Attention was first paid towards placement of a tunneled central venous catheter. Left neck and chest were prepped and draped in usual sterile fashion. Under direct ultrasound guidance, the left internal jugular vein was accessed with a micropuncture kit after the overlying soft tissues were anesthetized with 1% lidocaine with epinephrine. This allowed for placement of a peel-away sheath. The microwire was utilized for measurement purposes and a 27 cm dual lumen cuffed central venous catheter was tunneled from the left anterior chest to the venotomy site and inserted through the peel-away sheath with tip ultimately terminating within the superior cavoatrial  junction. Postprocedural spot fluoroscopic image was obtained. The catheter was secured at the exit site within interrupted 0 Prolene suture and the venotomy site was closed with Dermabond and Steri-Strips. Attention was now paid towards placement of a tunneled hemodialysis catheter. The right neck and chest were prepped with chlorhexidine in a sterile fashion, and a sterile drape was applied covering the operative field. Maximum barrier sterile technique with sterile gowns and gloves were used for the procedure. A timeout was performed prior to the initiation of the procedure. After creating a small venotomy incision, a micropuncture kit was utilized to access the internal jugular vein. Real-time ultrasound guidance was utilized for vascular access including the acquisition of a permanent ultrasound image documenting patency of the accessed vessel. The microwire was utilized to measure appropriate catheter length. A stiff Glidewire was advanced to the level of the IVC and the micropuncture sheath was exchanged for a peel-away sheath. A palindrome tunneled hemodialysis catheter measuring 19 cm from tip to cuff was tunneled in a retrograde fashion from the anterior chest wall to the venotomy incision. The catheter was then placed through the peel-away sheath with tips ultimately positioned within the superior aspect of the right atrium. Final catheter positioning was confirmed and documented with a spot radiographic image. The catheter aspirates and flushes normally. The catheter was flushed with appropriate volume heparin dwells. The catheter exit site was secured with a 0-Prolene retention suture. The venotomy incision was closed with Dermabond and Steri-strips. Dressings were applied. The patient tolerated the procedure well without immediate post procedural complication. IMPRESSION: 1. Successful placement a 27 cm tunneled dual-lumen central venous catheter via the left internal jugular vein with tip terminating  within the superior cavoatrial junction. The catheter is ready for immediate usage. 2. Successful placement of 19 cm tip to cuff tunneled hemodialysis catheter via the right internal jugular vein with tips terminating within the superior aspect of the right atrium. The catheter is ready for immediate use. Electronically Signed   By: Simonne Come M.D.   On: 12/24/2023 17:07   DG CHEST PORT 1 VIEW Result Date: 12/24/2023 CLINICAL DATA:  Central line placement. EXAM: PORTABLE CHEST 1 VIEW COMPARISON:  Chest x-ray dated December 20, 2023. FINDINGS: Interval exchange of the right internal jugular central venous catheter for a new tunneled right internal jugular dialysis catheter with the tip in the proximal right atrium. New tunneled left internal jugular central venous catheter with tip in the proximal right atrium as well. Unchanged feeding tube tip in the distal stomach. Stable cardiomegaly status post mitral and tricuspid valve replacements. Similar mild central pulmonary vascular congestion. Dense left lower lobe opacity, likely atelectasis, and probable small left pleural effusion are unchanged.  No pneumothorax. No acute osseous abnormality. IMPRESSION: 1. New tunneled bilateral internal jugular catheters with tips in the proximal right atrium. No pneumothorax. 2. Unchanged left basilar atelectasis and probable small left pleural effusion. Electronically Signed   By: Obie Dredge M.D.   On: 12/24/2023 14:49   DG Chest Port 1 View Result Date: 12/20/2023 CLINICAL DATA:  Atelectasis. EXAM: PORTABLE CHEST 1 VIEW COMPARISON:  12/17/2023 FINDINGS: Again noted is enlargement of the cardiac silhouette. Increased densities at the left lung base could represent more consolidation or atelectasis. Difficult to exclude left pleural fluid. Right lung remains clear. Feeding tube extends down the esophagus and into the stomach but the tip is beyond the image. Stable position of the right jugular dialysis catheter with the  tip near the superior cavoatrial junction. Post cardiac surgery changes are noted. IMPRESSION: 1. Slightly increased densities at the left lung base. Findings could represent atelectasis or consolidation. 2. Stable enlargement of the cardiac silhouette. 3. Stable support apparatus. Electronically Signed   By: Richarda Overlie M.D.   On: 12/20/2023 09:07   CARDIAC CATHETERIZATION Result Date: 12/17/2023 1. Low filling pressures. 2. Mild pulmonary artery hypertension 3. Low cardiac output, CI 1.74 by Fick. 4. Preserved PAPi I will restart milrinone at 0.125 mcg/kg/min   DG CHEST PORT 1 VIEW Result Date: 12/17/2023 CLINICAL DATA:  Follow-up pulmonary edema. EXAM: PORTABLE CHEST 1 VIEW COMPARISON:  12/14/2023 and 12/13/2023. FINDINGS: Moderate enlargement of the cardiac silhouette, unchanged. Right internal jugular central venous line and enteric tube unchanged. Left lung base opacity partly obscures hemidiaphragm, mildly improved from the previous exam. Remainder of the lungs is clear. No pneumothorax. IMPRESSION: 1. Mild improvement in left lung base opacity from the most recent prior exam. No other change. No new lung abnormalities. 2. Stable changes from cardiac surgery and valve replacement. 3. Stable remaining support apparatus. Electronically Signed   By: Amie Portland M.D.   On: 12/17/2023 09:46   DG CHEST PORT 1 VIEW Result Date: 12/14/2023 CLINICAL DATA:  Central line placement EXAM: PORTABLE CHEST 1 VIEW COMPARISON:  12/14/2023 FINDINGS: Right dialysis catheter placement with the tip at the cavoatrial junction. No pneumothorax. Cardiomegaly with vascular congestion. Left lower lobe retrocardiac opacity is unchanged. No confluent opacity on the right. IMPRESSION: Right central line placement with the tip at the cavoatrial junction. No pneumothorax. Cardiomegaly, vascular congestion. Stable left lower lobe atelectasis or infiltrate. Electronically Signed   By: Charlett Nose M.D.   On: 12/14/2023 10:28    ECHOCARDIOGRAM COMPLETE Result Date: 12/14/2023    ECHOCARDIOGRAM REPORT   Patient Name:   JAQUAYA COYLE Date of Exam: 12/14/2023 Medical Rec #:  [id number]        Height:       63.0 in Accession #:    [id number]       Weight:       181.4 lb Date of Birth:  [birth year]         BSA:          1.855 m Patient Age:    72 years         BP:           93/60 mmHg Patient Gender: F                HR:           95 bpm. Exam Location:  Inpatient Procedure: 2D Echo, Color Doppler, Cardiac Doppler and Intracardiac  Opacification Agent Indications:    I31.3 Pericardial effusion  History:        Patient has prior history of Echocardiogram examinations, most                 recent 12/10/2023. CHF, Pulmonary HTN; Risk Factors:Hypertension.                 30mm TriRing T30 placed 11/29/23.                  Mitral Valve: 31 mm Medtronic bioprosthetic valve valve is                 present in the mitral position. Procedure Date: 11/29/23.  Sonographer:    Irving Burton Senior RDCS Referring Phys: [id number] LINDSAY NICOLE [facility] IMPRESSIONS  1. Left ventricular ejection fraction, by estimation, is 35 to 40%. The left ventricle has moderately decreased function. The left ventricle demonstrates regional wall motion abnormalities (see scoring diagram/findings for description). There is mild asymmetric left ventricular hypertrophy of the infero-lateral segment. Left ventricular diastolic function could not be evaluated.  2. Right ventricular systolic function is moderately reduced. The right ventricular size is moderately enlarged. There is severely elevated pulmonary artery systolic pressure. The estimated right ventricular systolic pressure is 64.8 mmHg.  3. Left atrial size was moderately dilated.  4. The mitral valve has been repaired/replaced. Trivial mitral valve regurgitation. Moderate mitral stenosis. The mean mitral valve gradient is 6.0 mmHg with average heart rate of 90 bpm. There is a 31 mm Medtronic bioprosthetic valve present  in the mitral position. Procedure Date: 11/29/23.  5. TriRing T30 implanted 11/29/23, echo findings are consistent with normal structure and central tricuspid regurgitation. Tricuspid valve gradient 1.4 mm HG at heart rate of 88 bpm. The tricuspid valve is has been repaired/replaced. Tricuspid valve regurgitation is mild to moderate.  6. The aortic valve is tricuspid. Aortic valve regurgitation is trivial. Mild aortic valve stenosis. Aortic valve mean gradient measures 16.0 mmHg. Aortic valve Vmax measures 2.71 m/s.  7. The inferior vena cava is dilated in size with <50% respiratory variability, suggesting right atrial pressure of 15 mmHg. Comparison(s): No prior Echocardiogram. LV Septal bounce and IVC plethora remain. FINDINGS  Left Ventricle: Left ventricular ejection fraction, by estimation, is 35 to 40%. The left ventricle has moderately decreased function. The left ventricle demonstrates regional wall motion abnormalities. Definity contrast agent was given IV to delineate the left ventricular endocardial borders. The left ventricular internal cavity size was normal in size. There is mild asymmetric left ventricular hypertrophy of the infero-lateral segment. Left ventricular diastolic function could not be evaluated due to  mitral valve replacement. Left ventricular diastolic function could not be evaluated.  LV Wall Scoring: The basal inferior segment is akinetic. The anterior septum, mid inferoseptal segment, mid inferior segment, and basal inferoseptal segment are hypokinetic. The entire anterior wall, entire lateral wall, and entire apex are normal. Right Ventricle: The right ventricular size is moderately enlarged. No increase in right ventricular wall thickness. Right ventricular systolic function is moderately reduced. There is severely elevated pulmonary artery systolic pressure. The tricuspid regurgitant velocity is 3.53 m/s, and with an assumed right atrial pressure of 15 mmHg, the estimated right  ventricular systolic pressure is 64.8 mmHg. Left Atrium: Left atrial size was moderately dilated. Right Atrium: Right atrial size was normal in size. Pericardium: There is no evidence of pericardial effusion. Mitral Valve: The mitral valve has been repaired/replaced. Trivial mitral valve regurgitation. There is a 31 mm  Medtronic bioprosthetic valve present in the mitral position. Procedure Date: 11/29/23. Moderate mitral valve stenosis. MV peak gradient, 14.1  mmHg. The mean mitral valve gradient is 6.0 mmHg with average heart rate of 90 bpm. Tricuspid Valve: TriRing T30 implanted 11/29/23, echo findings are consistent with normal structure and central tricuspid regurgitation. Tricuspid valve gradient 1.4 mm HG at heart rate of 88 bpm. The tricuspid valve is has been repaired/replaced. Tricuspid valve regurgitation is mild to moderate. No evidence of tricuspid stenosis. Aortic Valve: The aortic valve is tricuspid. Aortic valve regurgitation is trivial. Mild aortic stenosis is present. Aortic valve mean gradient measures 16.0 mmHg. Aortic valve peak gradient measures 29.4 mmHg. Aortic valve area, by VTI measures 0.76 cm. Pulmonic Valve: The pulmonic valve was normal in structure. Pulmonic valve regurgitation is trivial. No evidence of pulmonic stenosis. Aorta: The aortic root and ascending aorta are structurally normal, with no evidence of dilitation. Pulmonary Artery: The pulmonary artery is of normal size. Venous: The inferior vena cava is dilated in size with less than 50% respiratory variability, suggesting right atrial pressure of 15 mmHg. IAS/Shunts: No atrial level shunt detected by color flow Doppler.  LEFT VENTRICLE PLAX 2D LVIDd:         4.70 cm   Diastology LVIDs:         3.80 cm   LV e' medial:    6.37 cm/s LV PW:         1.30 cm   LV E/e' medial:  29.7 LV IVS:        0.90 cm   LV e' lateral:   9.64 cm/s LVOT diam:     1.70 cm   LV E/e' lateral: 19.6 LV SV:         31 LV SV Index:   17 LVOT Area:      2.27 cm  RIGHT VENTRICLE RV S prime:     9.48 cm/s TAPSE (M-mode): 1.0 cm LEFT ATRIUM             Index        RIGHT ATRIUM           Index LA diam:        4.00 cm 2.16 cm/m   RA Area:     12.10 cm LA Vol (A2C):   59.8 ml 32.23 ml/m  RA Volume:   27.00 ml  14.55 ml/m LA Vol (A4C):   80.2 ml 43.23 ml/m LA Biplane Vol: 74.6 ml 40.21 ml/m  AORTIC VALVE AV Area (Vmax):    0.86 cm AV Area (Vmean):   0.86 cm AV Area (VTI):     0.76 cm AV Vmax:           271.00 cm/s AV Vmean:          178.500 cm/s AV VTI:            0.405 m AV Peak Grad:      29.4 mmHg AV Mean Grad:      16.0 mmHg LVOT Vmax:         102.13 cm/s LVOT Vmean:        67.667 cm/s LVOT VTI:          0.136 m LVOT/AV VTI ratio: 0.34  AORTA Ao Root diam: 2.80 cm Ao Asc diam:  3.30 cm MITRAL VALVE                TRICUSPID VALVE MV Area (PHT): 2.07 cm     TV Peak grad:  4.0 mmHg MV Area VTI:   0.82 cm     TV Mean grad:   1.0 mmHg MV Peak grad:  14.1 mmHg    TV Vmax:        1.00 m/s MV Mean grad:  6.0 mmHg     TV Vmean:       55.3 cm/s MV Vmax:       1.88 m/s     TV VTI:         0.26 msec MV Vmean:      99.6 cm/s    TR Peak grad:   49.8 mmHg MV Decel Time: 366 msec     TR Vmax:        353.00 cm/s MV E velocity: 189.00 cm/s                             SHUNTS                             Systemic VTI:  0.14 m                             Systemic Diam: 1.70 cm Riley Lam MD Electronically signed by Riley Lam MD Signature Date/Time: 12/14/2023/10:23:48 AM    Final    DG CHEST PORT 1 VIEW Result Date: 12/14/2023 CLINICAL DATA:  Hypoxia EXAM: PORTABLE CHEST 1 VIEW COMPARISON:  12/11/2023 FINDINGS: Cardiomegaly, vascular congestion. Left lower lobe airspace opacity again noted, unchanged. No confluent opacity on the right. No effusions or acute bony abnormality. IMPRESSION: Cardiomegaly. Stable left lower lobe retrocardiac atelectasis or infiltrate. Electronically Signed   By: Charlett Nose M.D.   On: 12/14/2023 09:33   CT CHEST ABDOMEN  PELVIS WO CONTRAST Result Date: 12/13/2023 CLINICAL DATA:  Sepsis EXAM: CT CHEST, ABDOMEN AND PELVIS WITHOUT CONTRAST TECHNIQUE: Multidetector CT imaging of the chest, abdomen and pelvis was performed following the standard protocol without IV contrast. RADIATION DOSE REDUCTION: This exam was performed according to the departmental dose-optimization program which includes automated exposure control, adjustment of the mA and/or kV according to patient size and/or use of iterative reconstruction technique. COMPARISON:  CT chest dated 11/15/2023. FINDINGS: CT CHEST FINDINGS Cardiovascular: Mild cardiomegaly. Interval mitral and tricuspid valve replacement. Moderate hemorrhagic pericardial effusion, new, with focal 5.3 cm hematoma along the right heart border (coronal image 46) with adjacent tiny foci of pericardial gas (series 3/image 26), likely postprocedural. Interval placement of a left IJ venous catheter terminating along the right lateral aspect of the lower SVC. No evidence of thoracic aortic aneurysm. Atherosclerotic calcifications of the aortic arch. Mediastinum/Nodes: No suspicious mediastinal lymphadenopathy. Markedly enlarged left thyroid gland with thyroid goiter. Lungs/Pleura: Patchy bilateral lower lobe opacities, likely atelectasis. Additional mild dependent atelectasis in the posterior right upper lobe. No focal consolidation. No suspicious pulmonary nodules. No pleural effusion or pneumothorax. Musculoskeletal: Thoracic spine is within normal limits. Median sternotomy. CT ABDOMEN PELVIS FINDINGS Hepatobiliary: Unenhanced liver is unremarkable. Layering gallstones (series 3/image 86), without associated inflammatory changes. Pancreas: Within normal limits. Spleen: Within normal limits. Adrenals/Urinary Tract: Adrenal glands are within normal limits. Right kidney is within normal limits. 1.6 cm simple cyst in the anterior left upper kidney (series 3/image 82), benign (Bosniak I). No follow-up is  recommended. No hydronephrosis. Bladder is underdistended and poorly evaluated. Stomach/Bowel: Enteric tube terminates in the proximal duodenum. No evidence of  bowel obstruction. Normal appendix (series 3/image 81). No colonic wall thickening or inflammatory changes. Vascular/Lymphatic: No evidence of abdominal aortic aneurysm. No suspicious abdominopelvic lymphadenopathy. Reproductive: Calcified uterine fibroids. Bilateral ovaries are within normal limits. Other: No abdominopelvic ascites. Musculoskeletal: Mild degenerative changes of the lumbar spine. IMPRESSION: Interval mitral and tricuspid valve replacement. Moderate hemorrhagic pericardial effusion, new, with focal 5.3 cm hematoma along the right heart border, likely postprocedural. Consider echocardiography for further evaluation, as clinically warranted. Patchy bilateral lower lobe opacities, likely atelectasis. No acute findings in the abdomen/pelvis. Additional ancillary findings as above. Electronically Signed   By: Charline Bills M.D.   On: 12/13/2023 23:19    Microbiology: No results found for this or any previous visit (from the past 240 hours).   Labs: Basic Metabolic Panel: Recent Labs  Lab 01/07/24 0546 01/08/24 0640 01/09/24 0350 01/10/24 0500 01/10/24 1352 01/11/24 0505 01/12/24 0605 01/12/24 0719  NA 133* 135 132*  --  133*  --   --  134*  K 4.4 4.1 4.3  --  4.3  --   --  4.4  CL 91* 94* 92*  --  91*  --   --  94*  CO2 26 26 25   --  22  --   --  24  GLUCOSE 102* 108* 110*  --  113*  --   --  154*  BUN 35* 22 38*  --  59*  --   --  34*  CREATININE 4.79* 3.36* 4.85*  --  7.12*  --   --  5.16*  CALCIUM 9.1 9.4 9.2  --  9.6  --   --  9.2  MG 2.3 2.3 2.3 2.5*  --  2.1 2.3  --   PHOS 4.6 3.6 4.0  --  5.3*  --   --  5.0*   Liver Function Tests: Recent Labs  Lab 01/07/24 0546 01/08/24 0640 01/09/24 0350 01/10/24 1352 01/12/24 0719  ALBUMIN 2.8* 3.0* 3.0* 3.0* 3.1*   No results for input(s): "LIPASE", "AMYLASE" in  the last 168 hours. No results for input(s): "AMMONIA" in the last 168 hours. CBC: Recent Labs  Lab 01/07/24 0546 01/08/24 0640 01/09/24 0350 01/10/24 0500 01/12/24 0605  WBC 9.4 9.4 10.4 9.6 9.6  HGB 10.8* 11.8* 11.4* 11.1* 11.8*  HCT 33.8* 37.5 35.9* 35.0* 37.4  MCV 98.5 100.3* 98.9 98.0 97.9  PLT 177 183 180 168 182   Cardiac Enzymes: No results for input(s): "CKTOTAL", "CKMB", "CKMBINDEX", "TROPONINI" in the last 168 hours. BNP: BNP (last 3 results) Recent Labs    05/26/23 1610 08/26/23 1103 09/22/23 0905  BNP 778.0* 781.0* 926.0*    ProBNP (last 3 results) No results for input(s): "PROBNP" in the last 8760 hours.  CBG: Recent Labs  Lab 01/11/24 1131 01/11/24 1752 01/11/24 2113 01/12/24 0627 01/12/24 1305  GLUCAP 142* 88 100* 103* 107*       Signed:  Ramiro Harvest MD.  [facility] 01/12/2024, 3:58 PM

## 2024-01-12 NOTE — Progress Notes (Signed)
Hand off given to the unit RN, all the patient's belongings packed in the bag and transferred to the unit with patient, CCMD notified.

## 2024-01-12 NOTE — Procedures (Signed)
Patient seen and examined on Hemodialysis. The procedure was supervised and I have made appropriate changes. BP 106/71   Pulse 91   Temp 97.6 F (36.4 C)   Resp (!) 22   Ht 5\' 3"  (1.6 m)   Wt 69.9 kg   SpO2 100%   BMI 27.30 kg/m   QB 400 mL/ min via TDC, UF goal 2L  Tolerating treatment without complaints at this time.   Bufford Buttner MD [facility] [phone] 11:21 AM

## 2024-01-12 NOTE — Progress Notes (Signed)
Inpatient Rehab Admissions Coordinator:    CIR following. Continue to await insurance auth.  Megan Salon, MS, CCC-SLP Rehab Admissions Coordinator  [phone] (celll) [phone] (office)

## 2024-01-12 NOTE — H&P (Signed)
Physical Medicine and Rehabilitation Admission H&P    CC: Functional deficits due to prolonged hospitalization w/debility.   HPI:  Vanessa [name]. [place] a 73 year old female with history of CKD, NICM with EF 40-45%, severe MR s/p mitral clip X 2 who started developing LE edema with DOE due to severe MR. She was admitted on 11/29/23 for MVR with mosaic porcine tissue valve and tricupid annuloplasty ring by Dr. Leafy Ro. Post op with hypovolemic cardiogenic shock due to cardiac tamponade and taken back to OR that evening for reexploration with evacuation of large amount of clot in precardial and pleural spaces but continued to ooze requiring 3 units PRBC, 4 FFP, 1 plt and cryoprecipitate.  Post op NSVT treated with amiodarone and developed respiratory distress due to fluid overload requiring reintubation on 01/02. CRRT initiated  for ATN and cardiorenal syndrome in setting of shock and started on TF for nutritional support.   Multifactorial shock treated with pressors, stress dose steroids and she tolerated extubation by 01/05. She was started on IV heparin due to persistent A fib and midodrine added to help with BP support. She had issues with abdominal pain and rise in bilirubin to 15 but abdominal ultrasound showed dilated GB without thickening, fluid, stones and was negative for ductal dilatation.  Leucocytosis treated with broad spectrum antibiotics and no source of infection found.  CT abdomen/pelvis 01/13 showed moderate hemorrhagic pericardial effusion with 5.3 cm hematoma and RUL atelectasis without gallstones. 2D echo repeated 01/14 revealing EF 35-40%, trivial MR and no evidence of pericardial effusion. HD cath removed on 01/14 but continued to require CRRT and milirionone added to help with fluid status on 01/17.    ABLA treated with addition of aranesp and thrombocytopenia resolved off heparin. She has also had issues with anxiety, depression, insomnia, nausea as well as ongoing need for  pressors. CRRT stopped on 01/20 and Droxidopa added 01/21 to aid weaning of pressors. She was noted to be anuric therefore tunneled catheter placed right neck and internal jugular in left neck by Dr. Grace Isaac and CRRT resumed 01/22. As BP stabilized, she was transitioned to HD and reporting some nausea and fatigue SE.PT/OT consulted and patient was noted to be limited by sternal precautions, debility, dizziness with increase in activity and is requiring mod assist with ADLs and mobility. CIR recommended due to functional decline. Reports fatigue.   Review of Systems  Constitutional:  Positive for malaise/fatigue.  HENT:  Negative for hearing loss and tinnitus.   Respiratory:  Negative for cough and shortness of breath.   Cardiovascular:  Negative for chest pain, palpitations and leg swelling.  Gastrointestinal:  Negative for abdominal pain, constipation, heartburn and nausea.  Genitourinary:  Positive for urgency. Negative for frequency.       Feels like she has to go but "cannot get anything out"   Musculoskeletal:  Negative for joint pain and myalgias.  Neurological:  Negative for dizziness and headaches.    Past Medical History:  Diagnosis Date   CHF (congestive heart failure) (HCC)    CKD (chronic kidney disease)    Heart murmur    Hypertension    Moderate mitral regurgitation    moderate to severe MR with moderate pulmonary HTN   Multinodular goiter    Nonischemic cardiomyopathy (HCC)    EF 30-35% by echo 2015   Pulmonary HTN (HCC)    moderate with PASP by echo 2015   PVC's (premature ventricular contractions)     Past Surgical History:  Procedure Laterality Date   CARDIAC CATHETERIZATION  2003   normal coronary arteries   CARDIAC CATHETERIZATION  2013  Nov   h/o nonischemic DCM EF 35-40% increase LVEDP    EXPLORATION POST OPERATIVE OPEN HEART N/A 11/29/2023   Procedure: EXPLORATION POST OPERATIVE OPEN HEART;  Surgeon: Eugenio Hoes, MD;  Location: [facility] OR;  Service: Open  Heart Surgery;  Laterality: N/A;   HYSTEROSCOPY WITH D & C  12/02/2012   Procedure: DILATATION AND CURETTAGE /HYSTEROSCOPY;  Surgeon: Dorien Chihuahua. Richardson Dopp, MD;  Location: [facility] ORS;  Service: Gynecology;  Laterality: N/A;  cervical polypectomy   IR FLUORO GUIDE CV LINE LEFT  12/24/2023   IR FLUORO GUIDE CV LINE RIGHT  12/24/2023   IR US GUIDE VASC ACCESS LEFT  12/24/2023   IR US GUIDE VASC ACCESS RIGHT  12/24/2023   LEFT AND RIGHT HEART CATHETERIZATION WITH CORONARY ANGIOGRAM N/A 02/15/2015   Procedure: LEFT AND RIGHT HEART CATHETERIZATION WITH CORONARY ANGIOGRAM;  Surgeon: Corky Crafts, MD;  Location[facility] CATH LAB;  Service: Cardiovascular;  Laterality: N/A;   MITRAL VALVE REPLACEMENT N/A 11/29/2023   Procedure: MITRAL VALVE (MV) REPLACEMENT USING MOSAIC 310CINCH TISSUE VALVE;  Surgeon: Eugenio Hoes, MD;  Location: [facility] OR;  Service: Open Heart Surgery;  Laterality: N/A;   RIGHT HEART CATH N/A 12/17/2023   Procedure: RIGHT HEART CATH;  Surgeon: Laurey Morale, MD;  Location: [facility] INVASIVE CV LAB;  Service: Cardiovascular;  Laterality: N/A;   RIGHT/LEFT HEART CATH AND CORONARY ANGIOGRAPHY N/A 10/12/2023   Procedure: RIGHT/LEFT HEART CATH AND CORONARY ANGIOGRAPHY;  Surgeon: Laurey Morale, MD;  Location: [facility] INVASIVE CV LAB;  Service: Cardiovascular;  Laterality: N/A;   TEE WITHOUT CARDIOVERSION N/A 10/11/2018   Procedure: TRANSESOPHAGEAL ECHOCARDIOGRAM (TEE);  Surgeon: Laurey Morale, MD;  Location: [facility] ENDOSCOPY;  Service: Cardiovascular;  Laterality: N/A;   TEE WITHOUT CARDIOVERSION N/A 09/07/2023   Procedure: TRANSESOPHAGEAL ECHOCARDIOGRAM;  Surgeon: Laurey Morale, MD;  Location: [facility] INVASIVE CV LAB;  Service: Cardiovascular;  Laterality: N/A;   TEE WITHOUT CARDIOVERSION N/A 11/29/2023   Procedure: TRANSESOPHAGEAL ECHOCARDIOGRAM;  Surgeon: Eugenio Hoes, MD;  Location: [facility] OR;  Service: Open Heart Surgery;  Laterality: N/A;   TRANSCATHETER MITRAL EDGE TO EDGE REPAIR N/A 12/14/2018   Procedure:  MITRAL VALVE REPAIR;  Surgeon: Tonny Bollman, MD;  Location: [facility] INVASIVE CV LAB;  Service: Cardiovascular;  Laterality: N/A;   TRICUSPID VALVE REPLACEMENT N/A 11/29/2023   Procedure: TRICUSPID VALVE REPAIR USING MC3 EDWARDS TRICUSPID ANNULOPLASTY RING;  Surgeon: Eugenio Hoes, MD;  Location: [facility] OR;  Service: Open Heart Surgery;  Laterality: N/A;    Family History  Problem Relation Age of Onset   Breast cancer Mother 6   Heart disease Father    Breast cancer Sister 3   Colon cancer Neg Hx    Esophageal cancer Neg Hx    Rectal cancer Neg Hx    Stomach cancer Neg Hx    Thyroid disease Neg Hx     Social History:  Lives alone. Divorced. 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 She  reports that she has never smoked. She has never used smokeless tobacco. She reports that she does not drink alcohol and does not use drugs.   Allergies:  Allergies  Allergen Reactions   Spironolactone     hyperkalemia     Medications Prior to Admission  Medication Sig Dispense Refill   amiodarone (PACERONE) 200 MG  tablet Take 1 tablet (200 mg total) by mouth daily. 30 tablet 1   ascorbic acid (VITAMIN C) 250 MG tablet Take 1 tablet (250 mg total) by mouth 2 (two) times daily.     Calcium Carb-Cholecalciferol (CALCIUM + VITAMIN D3 PO) Take 1 tablet by mouth in the morning.     empagliflozin (JARDIANCE) 10 MG TABS tablet TAKE 1 TABLET BY MOUTH ONCE DAILY BEFORE BREAKFAST 90 tablet 3   feeding supplement (ENSURE ENLIVE / ENSURE PLUS) LIQD Take 237 mLs by mouth 3 (three) times daily as needed (As requested, pt has been encourage to sip on Ensure all throughout the day, take with meds.).     ibandronate (BONIVA) 150 MG tablet Take 150 mg by mouth every 30 (thirty) days.     lanthanum (FOSRENOL) 500 MG chewable tablet Chew 1 tablet (500 mg total) by mouth 3 (three) times daily with meals.     levalbuterol (XOPENEX) 0.63 MG/3ML  nebulizer solution Take 3 mLs (0.63 mg total) by nebulization every 6 (six) hours as needed for wheezing or shortness of breath.     metoCLOPramide (REGLAN) 5 MG tablet Take 1 tablet (5 mg total) by mouth every 8 (eight) hours as needed for refractory nausea / vomiting.     midodrine (PROAMATINE) 10 MG tablet Take 2 tablets (20 mg total) by mouth 3 (three) times daily with meals. 180 tablet 2   mirtazapine (REMERON) 7.5 MG tablet Take 1 tablet (7.5 mg total) by mouth at bedtime. 30 tablet 0   multivitamin (RENA-VIT) TABS tablet Take 1 tablet by mouth 2 (two) times daily.     Nystatin (GERHARDT'S BUTT CREAM) CREA Apply 1 Application topically 2 (two) times daily. Apply to periarea     oxyCODONE (OXY IR/ROXICODONE) 5 MG immediate release tablet Take 1 tablet (5 mg total) by mouth every 3 (three) hours as needed for severe pain (pain score 7-10). 10 tablet 0   [START ON 01/13/2024] polyethylene glycol (MIRALAX / GLYCOLAX) 17 g packet Take 17 g by mouth daily.     QUEtiapine (SEROQUEL) 25 MG tablet Take 1 tablet (25 mg total) by mouth at bedtime. 30 tablet 0   senna-docusate (SENOKOT-S) 8.6-50 MG tablet Take 2 tablets by mouth 2 (two) times daily.     sertraline (ZOLOFT) 25 MG tablet Take 1 tablet (25 mg total) by mouth daily. 30 tablet 1   traZODone (DESYREL) 100 MG tablet Take 1 tablet (100 mg total) by mouth at bedtime as needed for sleep (2nd line). 15 tablet 0   warfarin (COUMADIN) 3 MG tablet Take 1 tablet (3 mg total) by mouth daily at 4 PM.          Home: Home Living Family/patient expects to be discharged to:: Private residence Living Arrangements: Alone Available Help at Discharge: Family, Available 24 hours/day Type of Home: House Home Access: Level entry Home Layout: One level Bathroom Shower/Tub: Armed forces operational officer Accessibility: Yes Home Equipment: None Additional Comments: mom will be able to assist at D/C  Lives With: Other (Comment)    Functional History: Prior Function Prior Level of Function : Independent/Modified Independent, Driving   Functional Status:  Mobility: Bed Mobility Overal bed mobility: Needs Assistance (Simultaneous filing. User may not have seen previous data.) Bed Mobility: Supine to Sit (Simultaneous filing. User may not have seen previous data.) Rolling: Min assist Sidelying to sit: Mod assist Supine to sit: Mod assist, HOB elevated, Used rails Sit to supine: Mod assist Sit to sidelying:  Min assist General bed mobility comments: assist to scoot hips to EOB, light Min A to lift trunk (Simultaneous filing. User may not have seen previous data.) Transfers Overall transfer level: Needs assistance (Simultaneous filing. User may not have seen previous data.) Equipment used: Rolling walker (2 wheels) (Simultaneous filing. User may not have seen previous data.) Transfers: Sit to/from Stand (Simultaneous filing. User may not have seen previous data.) Sit to Stand: Mod assist (Simultaneous filing. User may not have seen previous data.) Bed to/from chair/wheelchair/BSC transfer type:: Step pivot Stand pivot transfers: Min assist, +2 safety/equipment, +2 physical assistance Step pivot transfers: Min assist, Mod assist  Lateral/Scoot Transfers: Mod assist, Max assist General transfer comment: Mod A to stand at bedside with pt placing hands on lap. Mod A x 2 needed to stand from armchair without armrests at sink d/t fatigue and lower surface. cues for exaggerating momentum to stand (Simultaneous filing. User may not have seen previous data.) Ambulation/Gait Ambulation/Gait assistance: Min assist Gait Distance (Feet): 12 Feet Assistive device: Rolling walker (2 wheels) Gait Pattern/deviations: Step-through pattern, Decreased step length - right, Decreased step length - left, Decreased stride length General Gait Details: ambulated from sink back to bed Gait velocity: decreased Gait velocity interpretation:  <1.31 ft/sec, indicative of household ambulator Pre-gait activities: took side steps at EOB with RW with good movement bil of LE for stepping toward [facility] and navigation of RW   ADL: ADL Overall ADL's : Needs assistance/impaired Eating/Feeding: Set up, Sitting Grooming: Set up, Sitting, Oral care, Wash/dry face Grooming Details (indicate cue type and reason): pt reported unable to stand at sink for task due to fatigue and dizziness so opted to complete tasks seated at sink Upper Body Bathing: Minimal assistance, Sitting, Cueing for compensatory techniques (adhering to sternal precautions) Upper Body Bathing Details (indicate cue type and reason): simulated sitting EOB Lower Body Bathing: Total assistance Upper Body Dressing : Contact guard assist, Sitting, Cueing for compensatory techniques (adhering to sternal precautions) Lower Body Dressing: Maximal assistance, Sit to/from stand, Sitting/lateral leans Lower Body Dressing Details (indicate cue type and reason): unable to reach down to feet for sock mgmt (reports bed too high and typically sits in low chair to manage at home). may benefit from AE education. Total A for compression stocking mgmt Toilet Transfer: Minimal assistance, Moderate assistance, BSC/3in1 (hand held assist +1; step-pivot) Toilet Transfer Details (indicate cue type and reason): Mods assist to power up then Min assist once in standing Toileting- Clothing Manipulation and Hygiene: Maximal assistance, +2 for safety/equipment, Sit to/from stand Toileting - Clothing Manipulation Details (indicate cue type and reason): pericare at bed level Functional mobility during ADLs: Contact guard assist, Rolling walker (2 wheels) General ADL Comments: pt continues to present with decreased activity tolerance, but with noted improvements in activity tolerance as compared to last skilled OT session   Cognition: Cognition Overall Cognitive Status: Within Functional Limits for tasks  assessed Orientation Level: Oriented X4 Cognition Arousal: Alert (Simultaneous filing. User may not have seen previous data.) Behavior During Therapy: [facility] for tasks assessed/performed, Flat affect (Simultaneous filing. User may not have seen previous data.) Overall Cognitive Status: Within Functional Limits for tasks assessed Area of Impairment: Problem solving, Awareness, Safety/judgement, Attention Current Attention Level: Selective Memory: Decreased recall of precautions Safety/Judgement: Decreased awareness of safety, Decreased awareness of deficits Awareness: Emergent Problem Solving: Requires verbal cues General Comments: AAOx4 and pleasant throughout session.  Physical Exam: Blood pressure 110/83, pulse 97, temperature 98.7 F (37.1 C), temperature source Oral, resp. rate  18, height 5\' 3"  (1.6 m), weight 67.5 kg, SpO2 96%.  Gen: no distress, normal appearing HEENT: oral mucosa pink and moist, NCAT Cardio: Reg rate Chest: normal effort, normal rate of breathing Abd: soft, non-distended Ext: no edema Psych: pleasant, normal affect Skin: intact Neuro:  Flat affect. Depressed appearing and frustrated about HD today. Speech clear and able to follow motor commands without difficulty. No focal deficits Results for orders placed or performed during the hospital encounter of 11/29/23 (from the past 48 hours)  Glucose, capillary     Status: Abnormal   Collection Time: 01/10/24  9:43 PM  Result Value Ref Range   Glucose-Capillary 170 (H) 70 - 99 mg/dL    Comment: Glucose reference range applies only to samples taken after fasting for at least 8 hours.  Magnesium     Status: None   Collection Time: 01/11/24  5:05 AM  Result Value Ref Range   Magnesium 2.1 1.7 - 2.4 mg/dL    Comment: Performed at [facility], [address], [place], [place] [address]  Protime-INR     Status: Abnormal   Collection Time: 01/11/24  5:05 AM  Result Value Ref Range   Prothrombin Time 26.8 (H)  11.4 - 15.2 seconds   INR 2.5 (H) 0.8 - 1.2    Comment: (NOTE) INR goal varies based on device and disease states. Performed at [facility], [address] [place], [place] [address]   Glucose, capillary     Status: Abnormal   Collection Time: 01/11/24  6:17 AM  Result Value Ref Range   Glucose-Capillary 118 (H) 70 - 99 mg/dL    Comment: Glucose reference range applies only to samples taken after fasting for at least 8 hours.  Glucose, capillary     Status: Abnormal   Collection Time: 01/11/24 11:31 AM  Result Value Ref Range   Glucose-Capillary 142 (H) 70 - 99 mg/dL    Comment: Glucose reference range applies only to samples taken after fasting for at least 8 hours.  Glucose, capillary     Status: None   Collection Time: 01/11/24  5:52 PM  Result Value Ref Range   Glucose-Capillary 88 70 - 99 mg/dL    Comment: Glucose reference range applies only to samples taken after fasting for at least 8 hours.  Glucose, capillary     Status: Abnormal   Collection Time: 01/11/24  9:13 PM  Result Value Ref Range   Glucose-Capillary 100 (H) 70 - 99 mg/dL    Comment: Glucose reference range applies only to samples taken after fasting for at least 8 hours.  Magnesium     Status: None   Collection Time: 01/12/24  6:05 AM  Result Value Ref Range   Magnesium 2.3 1.7 - 2.4 mg/dL    Comment: Performed at [facility], [address], [place], [place] [address]  Protime-INR     Status: Abnormal   Collection Time: 01/12/24  6:05 AM  Result Value Ref Range   Prothrombin Time 28.4 (H) 11.4 - 15.2 seconds   INR 2.6 (H) 0.8 - 1.2    Comment: (NOTE) INR goal varies based on device and disease states. Performed at [facility], [address] [place], [place] [address]   CBC     Status: Abnormal   Collection Time: 01/12/24  6:05 AM  Result Value Ref Range   WBC 9.6 4.0 - 10.5 K/uL   RBC 3.82 (L) 3.87 - 5.11 MIL/uL  Hemoglobin 11.8 (L) 12.0 - 15.0 g/dL   HCT 29.5 62.1 - 30.8 %    MCV 97.9 80.0 - 100.0 fL   MCH 30.9 26.0 - 34.0 pg   MCHC 31.6 30.0 - 36.0 g/dL   RDW 65.7 (H) 84.6 - 96.2 %   Platelets 182 150 - 400 K/uL   nRBC 0.0 0.0 - 0.2 %    Comment: Performed at [facility], [address], [place], [place] [address]  Glucose, capillary     Status: Abnormal   Collection Time: 01/12/24  6:27 AM  Result Value Ref Range   Glucose-Capillary 103 (H) 70 - 99 mg/dL    Comment: Glucose reference range applies only to samples taken after fasting for at least 8 hours.  Renal function panel     Status: Abnormal   Collection Time: 01/12/24  7:19 AM  Result Value Ref Range   Sodium 134 (L) 135 - 145 mmol/L   Potassium 4.4 3.5 - 5.1 mmol/L   Chloride 94 (L) 98 - 111 mmol/L   CO2 24 22 - 32 mmol/L   Glucose, Bld 154 (H) 70 - 99 mg/dL    Comment: Glucose reference range applies only to samples taken after fasting for at least 8 hours.   BUN 34 (H) 8 - 23 mg/dL   Creatinine, Ser 1.32 (H) 0.44 - 1.00 mg/dL   Calcium 9.2 8.9 - 44.0 mg/dL   Phosphorus 5.0 (H) 2.5 - 4.6 mg/dL   Albumin 3.1 (L) 3.5 - 5.0 g/dL   GFR, Estimated 8 (L) >60 mL/min    Comment: (NOTE) Calculated using the CKD-EPI Creatinine Equation (2021)    Anion gap 16 (H) 5 - 15    Comment: Performed at [facility], [address] [place], [place] [address]  Glucose, capillary     Status: Abnormal   Collection Time: 01/12/24  1:05 PM  Result Value Ref Range   Glucose-Capillary 107 (H) 70 - 99 mg/dL    Comment: Glucose reference range applies only to samples taken after fasting for at least 8 hours.  Glucose, capillary     Status: Abnormal   Collection Time: 01/12/24  4:15 PM  Result Value Ref Range   Glucose-Capillary 146 (H) 70 - 99 mg/dL    Comment: Glucose reference range applies only to samples taken after fasting for at least 8 hours.   No results found.    Blood pressure 110/83, pulse 97, temperature 98.7 F (37.1 C), temperature source Oral, resp. rate 18, height 5\' 3"  (1.6  m), weight 67.5 kg, SpO2 96%.  Medical Problem List and Plan: 1. Functional deficits secondary cardiab debility complicated by AKI  -patient may shower  -ELOS/Goals: 8-11 days S  Admit to CIR 2.  Antithrombotics: -DVT/anticoagulation:  Pharmaceutical: Coumadin  -antiplatelet therapy:  3. Pain Management: denies pain. Tylenol prn mild pain and oxycodone prn severe pain. 4. Mood/Behavior/Sleep: Team to provide ego support. LCSW to follow for evaluation and support.   --ON Seroquel and low dose Remeron at nights. Melatonin prn added.   -antipsychotic agents: Seroquel daily at bedtime. 5. Neuropsych/cognition: This patient is capable of making decisions on her own behalf. 6. Skin/Wound Care: routine pressure relief measures. Monitor wound for healing 7. Fluids/Electrolytes/Nutrition:  Monitor I/O. Not on FR at this time  --Renal restrictions added to diet. Changed ensure to nephro  --continue vitamin C and protein supplement 8. Afib/A flutter: Monitor HR TID and for symptoms with activity.  --Continue Amiodarone 200  mg daily with coumadin. 9. Hypotension: TEDs. Midodrine 20 mg TID. KHT for support.   10. Acute on CKD: Now on HD MWF due to ischemic ATN/shock. Schedule HD at the end of the day to help with tolerance of therapy  --continue Phoslo for now--should improve with renal restrictions.   11. Acute on chronic anemia: continue Aranesp weekly   12. HFrEF: Strict I/O. Daily weights. Fluid status being managed with HD.  22. Depression: continue Zoloft, Seroquel and Remeron. Provide ego support.   23. Constipation: Has finally had BM after enema/miralax. D/c enema  I have personally performed a face to face diagnostic evaluation, including, but not limited to relevant history and physical exam findings, of this patient and developed relevant assessment and plan.  Additionally, I have reviewed and concur with the physician assistant's documentation above.  Jacquelynn Cree, PA-C    Horton Chin, MD 01/12/2024

## 2024-01-13 DIAGNOSIS — R5381 Other malaise: Secondary | ICD-10-CM | POA: Diagnosis not present

## 2024-01-13 DIAGNOSIS — Z992 Dependence on renal dialysis: Secondary | ICD-10-CM | POA: Insufficient documentation

## 2024-01-13 DIAGNOSIS — I5022 Chronic systolic (congestive) heart failure: Secondary | ICD-10-CM | POA: Insufficient documentation

## 2024-01-13 DIAGNOSIS — I2510 Atherosclerotic heart disease of native coronary artery without angina pectoris: Secondary | ICD-10-CM | POA: Insufficient documentation

## 2024-01-13 DIAGNOSIS — F4321 Adjustment disorder with depressed mood: Secondary | ICD-10-CM | POA: Insufficient documentation

## 2024-01-13 LAB — PROTIME-INR
INR: 2.6 — ABNORMAL HIGH (ref 0.8–1.2)
Prothrombin Time: 27.9 s — ABNORMAL HIGH (ref 11.4–15.2)

## 2024-01-13 LAB — GLUCOSE, CAPILLARY
Glucose-Capillary: 119 mg/dL — ABNORMAL HIGH (ref 70–99)
Glucose-Capillary: 160 mg/dL — ABNORMAL HIGH (ref 70–99)
Glucose-Capillary: 145 mg/dL — ABNORMAL HIGH (ref 70–99)
Glucose-Capillary: 104 mg/dL — ABNORMAL HIGH (ref 70–99)

## 2024-01-13 LAB — HEPATITIS B SURFACE ANTIGEN: Hepatitis B Surface Ag: NONREACTIVE

## 2024-01-13 MED ORDER — FLUDROCORTISONE ACETATE 0.1 MG PO TABS
0.0500 mg | ORAL_TABLET | Freq: Every day | ORAL | Status: DC
  Administered 2024-01-13 – 2024-01-17 (×5): 0.05 mg via ORAL
  Filled 2024-01-13 (×6): qty 0.5

## 2024-01-13 MED ORDER — CHLORHEXIDINE GLUCONATE CLOTH 2 % EX PADS
6.0000 | MEDICATED_PAD | Freq: Every day | CUTANEOUS | Status: DC
  Administered 2024-01-14: 6 via TOPICAL

## 2024-01-13 MED ORDER — KIDNEY FAILURE BOOK: Freq: Once | Status: AC

## 2024-01-13 MED ORDER — NEPRO/CARBSTEADY PO LIQD
237.0000 mL | Freq: Two times a day (BID) | ORAL | Status: DC
Start: 2024-01-13 — End: 2024-01-14
  Administered 2024-01-13: 237 mL via ORAL

## 2024-01-13 MED ORDER — ALTEPLASE 2 MG IJ SOLR
2.0000 mg | Freq: Once | INTRAMUSCULAR | Status: AC
  Administered 2024-01-13: 2 mg
  Filled 2024-01-13: qty 2

## 2024-01-13 MED ORDER — VITAMIN D 25 MCG (1000 UNIT) PO TABS
1000.0000 [IU] | ORAL_TABLET | Freq: Every day | ORAL | Status: DC
  Administered 2024-01-13 – 2024-01-29 (×17): 1000 [IU] via ORAL
  Filled 2024-01-13 (×17): qty 1

## 2024-01-13 MED ORDER — CHLORHEXIDINE GLUCONATE CLOTH 2 % EX PADS
6.0000 | MEDICATED_PAD | Freq: Two times a day (BID) | CUTANEOUS | Status: DC
  Administered 2024-01-13 – 2024-01-28 (×24): 6 via TOPICAL

## 2024-01-13 NOTE — Plan of Care (Signed)
  Problem: Education: Goal: Knowledge of General Education information will improve Description: Including pain rating scale, medication(s)/side effects and non-pharmacologic comfort measures 01/13/2024 0827 by Luther Redo, RN Outcome: Progressing 01/12/2024 2148 by Luther Redo, RN Outcome: Progressing   Problem: Health Behavior/Discharge Planning: Goal: Ability to manage health-related needs will improve 01/13/2024 0827 by Luther Redo, RN Outcome: Progressing 01/12/2024 2148 by Luther Redo, RN Outcome: Progressing   Problem: Clinical Measurements: Goal: Ability to maintain clinical measurements within normal limits will improve 01/13/2024 0827 by Luther Redo, RN Outcome: Progressing 01/12/2024 2148 by Luther Redo, RN Outcome: Progressing Goal: Will remain free from infection 01/13/2024 0827 by Luther Redo, RN Outcome: Progressing 01/12/2024 2148 by Luther Redo, RN Outcome: Progressing Goal: Diagnostic test results will improve 01/13/2024 0827 by Luther Redo, RN Outcome: Progressing 01/12/2024 2148 by Luther Redo, RN Outcome: Progressing Goal: Respiratory complications will improve 01/13/2024 0827 by Luther Redo, RN Outcome: Progressing 01/12/2024 2148 by Luther Redo, RN Outcome: Progressing Goal: Cardiovascular complication will be avoided 01/13/2024 0827 by Luther Redo, RN Outcome: Progressing 01/12/2024 2148 by Luther Redo, RN Outcome: Progressing   Problem: Activity: Goal: Risk for activity intolerance will decrease 01/13/2024 0827 by Luther Redo, RN Outcome: Progressing 01/12/2024 2148 by Luther Redo, RN Outcome: Progressing   Problem: Nutrition: Goal: Adequate nutrition will be maintained 01/13/2024 0827 by Luther Redo, RN Outcome: Progressing 01/12/2024 2148 by Luther Redo, RN Outcome: Progressing   Problem: Coping: Goal: Level of anxiety will  decrease 01/13/2024 0827 by Luther Redo, RN Outcome: Progressing 01/12/2024 2148 by Luther Redo, RN Outcome: Progressing   Problem: Elimination: Goal: Will not experience complications related to bowel motility 01/13/2024 0827 by Luther Redo, RN Outcome: Progressing 01/12/2024 2148 by Luther Redo, RN Outcome: Progressing Goal: Will not experience complications related to urinary retention 01/13/2024 0827 by Luther Redo, RN Outcome: Progressing 01/12/2024 2148 by Luther Redo, RN Outcome: Progressing   Problem: Pain Managment: Goal: General experience of comfort will improve and/or be controlled 01/13/2024 0827 by Luther Redo, RN Outcome: Progressing 01/12/2024 2148 by Luther Redo, RN Outcome: Progressing   Problem: Safety: Goal: Ability to remain free from injury will improve 01/13/2024 0827 by Luther Redo, RN Outcome: Progressing 01/12/2024 2148 by Luther Redo, RN Outcome: Progressing   Problem: Skin Integrity: Goal: Risk for impaired skin integrity will decrease 01/13/2024 0827 by Luther Redo, RN Outcome: Progressing 01/12/2024 2148 by Luther Redo, RN Outcome: Progressing

## 2024-01-13 NOTE — Progress Notes (Signed)
Physical Therapy Session Note  Patient Details  Name: MARTASIA TALAMANTE MRN: [id number] Date of Birth: Jun 08, 1951  Today's Date: 01/13/2024 PT Individual Time: 1300-1409 PT Individual Time Calculation (min): 69 min   Short Term Goals: Week 1:  PT Short Term Goal 1 (Week 1): pt will transfer supine<>sitting EOB with CGA PT Short Term Goal 2 (Week 1): pt will transfer sit<>stand with LRAD and min A consistantly PT Short Term Goal 3 (Week 1): pt will ambulate 75ft with LRAD and CGA  Skilled Therapeutic Interventions/Progress Updates:   Received pt sitting in recliner, pt agreeable to PT treatment, and denied any pain during session but reported feeling nauseous (premedicated) and fatigued. Nephrology MD present and CSW arrived briefly. Session with emphasis on functional mobility/transfers and generalized strengthening and endurance. Attempted to stand from low sitting recliner x 4 trials with max/total A, however pt unable to come upright due to weakness in LLE. Attempted lateral scoot onto WC, however pt with difficulty sequencing reciprocal scooting while maintaining sternal precautions. Retrieved slideboard and pt transferred recliner<>WC via slideboard with mod A with cues for anterior weight shifting.  Pt transported to/from room in [facility] dependently for time management purposes. Pt performed seated BLE strengthening on Kinetron at 20 cm/sec for 1 minute x 2 trials increasing to 15 cm/sec for 1 minute x 2 trials and 10 cm/sec for 1 minute with emphasis on quad/glute strength. Pt performed seated knee extensions with 1.5lb ankle weight 2x15 bilaterally and hip flexion with 1.5lb ankle weight 2x15 bilaterally with feet supported on 3in step. Of note, pt able to scoot hips back in chair without assist with step placed under feet. Returned to room and attempted transfer back to bed using slideboard but pt reported feeling like "dead weight" - located +2 and transferred WC<>bed via slideboard with mod A +2  and transferred into supine with max A for BLE management. Pt required max A to reposition in midline. Scooted to [facility] with max A +2, then suddenly with active emesis episode - provided pt with emesis bag and cool washcloth. Concluded session with pt semi-reclined in bed, needs within reach, and bed alarm on. RN present at bedside.  Therapy Documentation Precautions:  Precautions Precautions: Fall, Sternal Restrictions Weight Bearing Restrictions Per Provider Order: No LUE Weight Bearing Per Provider Order: Non weight bearing  Therapy/Group: Individual Therapy Marlana Salvage Zaunegger Blima Rich PT, DPT 01/13/2024, 7:14 AM

## 2024-01-13 NOTE — Progress Notes (Signed)
[facility] NEPHROLOGY PROGRESS NOTE  Assessment/ Plan: Pt is a 73 y.o. yo female     1. Acute kidney Injury on chronic kidney disease stage IIIa: Appears to be primarily from ischemic ATN in the setting of shock (mixed vasoplegic/cardiogenic).  Dense acute kidney injury with dialysis dependency and no evidence of renal recovery yet.  Receiving dialysis MWF schedule, next HD tomorrow.  Pt has been accepted at [facility] on MWF 12:10 pm chair time. Pt will need to arrive at 11:15 am for paperwork on first day.   2.  Severe mitral regurgitation/tricuspid regurgitation: Status post repair with postoperative cardiac tamponade.  Additional management per cardiothoracic surgery.  3.  Acute exacerbation of congestive heart failure with reduced ejection fraction: Volume status significantly improved with ongoing hemodialysis/ultrafiltration.  4.  Shock: From a combination of cardiogenic and vasoplegic etiology-currently on midodrine after previously requiring pressor/inotropic support.  5.  Hyperphosphatemia: Phosphorus level currently at goal for which we will continue phosphorus binder and continue low phosphorus/renal diet.  6. Anemia of CKD; Hb at goal.   Subjective: Seen and examined at bedside.  Denies nausea, vomiting, chest pain, shortness of breath.  Last HD yesterday with 1.5 L UF, tolerated well. Objective Vital signs in last 24 hours: Vitals:   01/12/24 1756 01/12/24 1825 01/12/24 1931 01/13/24 0538  BP: 110/83 110/83 106/70 99/65  Pulse: 97 97 92 93  Resp: 18 18 18 17   Temp: 98.7 F (37.1 C) 98.7 F (37.1 C) 98.1 F (36.7 C) (!) 97.3 F (36.3 C)  TempSrc:  Oral Oral   SpO2: 96%  99% 96%  Weight:  67.5 kg  64.5 kg  Height:  5\' 3"  (1.6 m)     Weight change:   Intake/Output Summary (Last 24 hours) at 01/13/2024 1332 Last data filed at 01/13/2024 1228 Gross per 24 hour  Intake 476 ml  Output --  Net 476 ml       Labs: RENAL PANEL Recent Labs  Lab  01/07/24 0546 01/08/24 0640 01/09/24 0350 01/10/24 0500 01/10/24 1352 01/11/24 0505 01/12/24 0605 01/12/24 0719  NA 133* 135 132*  --  133*  --   --  134*  K 4.4 4.1 4.3  --  4.3  --   --  4.4  CL 91* 94* 92*  --  91*  --   --  94*  CO2 26 26 25   --  22  --   --  24  GLUCOSE 102* 108* 110*  --  113*  --   --  154*  BUN 35* 22 38*  --  59*  --   --  34*  CREATININE 4.79* 3.36* 4.85*  --  7.12*  --   --  5.16*  CALCIUM 9.1 9.4 9.2  --  9.6  --   --  9.2  MG 2.3 2.3 2.3 2.5*  --  2.1 2.3  --   PHOS 4.6 3.6 4.0  --  5.3*  --   --  5.0*  ALBUMIN 2.8* 3.0* 3.0*  --  3.0*  --   --  3.1*    Liver Function Tests: Recent Labs  Lab 01/09/24 0350 01/10/24 1352 01/12/24 0719  ALBUMIN 3.0* 3.0* 3.1*   No results for input(s): "LIPASE", "AMYLASE" in the last 168 hours. No results for input(s): "AMMONIA" in the last 168 hours. CBC: Recent Labs    01/07/24 0546 01/08/24 0640 01/09/24 0350 01/10/24 0500 01/12/24 0605  HGB 10.8* 11.8* 11.4* 11.1* 11.8*  MCV 98.5 100.3* 98.9 98.0 97.9    Cardiac Enzymes: No results for input(s): "CKTOTAL", "CKMB", "CKMBINDEX", "TROPONINI" in the last 168 hours. CBG: Recent Labs  Lab 01/12/24 1305 01/12/24 1615 01/12/24 2129 01/13/24 0640 01/13/24 1147  GLUCAP 107* 146* 135* 119* 160*    Iron Studies: No results for input(s): "IRON", "TIBC", "TRANSFERRIN", "FERRITIN" in the last 72 hours. Studies/Results: No results found.  Medications: Infusions:  anticoagulant sodium citrate      Scheduled Medications:  amiodarone  200 mg Oral Daily   ascorbic acid  250 mg Oral BID   Chlorhexidine Gluconate Cloth  6 each Topical Q12H   [START ON 01/14/2024] Chlorhexidine Gluconate Cloth  6 each Topical Q0600   cholecalciferol  1,000 Units Oral Daily   [START ON 01/18/2024] darbepoetin (ARANESP) injection - NON-DIALYSIS  100 mcg Subcutaneous Q Tue-1800   feeding supplement (NEPRO CARB STEADY)  237 mL Oral BID BM   fludrocortisone  0.05 mg Oral Daily    Gerhardt's butt cream   Topical BID   lanthanum  500 mg Oral TID WC   midodrine  20 mg Oral TID WC   mirtazapine  7.5 mg Oral QHS   multivitamin  1 tablet Oral BID   polyethylene glycol  17 g Oral Daily   QUEtiapine  25 mg Oral QHS   senna-docusate  2 tablet Oral BID   sertraline  25 mg Oral Daily   sodium chloride flush  10-40 mL Intracatheter Q12H   warfarin  3 mg Oral q1600   Warfarin - Pharmacist Dosing Inpatient   Does not apply q1600    have reviewed scheduled and prn medications.  Physical Exam: General:NAD, comfortable Heart:RRR, s1s2 nl Lungs:clear b/l, no crackle Abdomen:soft, Non-tender, non-distended Extremities: Trace lower extremities edema present Dialysis Access: Right IJ TDC in place  Vanessa [name] Vanessa [name] Ettie Krontz 01/13/2024,1:32 PM  LOS: 1 day

## 2024-01-13 NOTE — Progress Notes (Signed)
Patient ID: Vanessa [name], female   DOB: 09-06-1951, 73 y.o.   MRN: [id number] Met with the patient to review current medical situation, rehab process, team conference and plan of care. Discussed secondary risk management including ESRD/HD, HF, A-fib with medications and dietary modification for renal diet and 1500 cc FR with daily weight checks. Discussed wound care/skin care and rationale for sternal precautions.  Continue to follow along to address educational needs to facilitate preparation for discharge. Plan for HD; [facility]W-F afternoons at [facility] however patient does not drive nor does her sisters who are coming in to assist; she will need transportation to/from HD sessions.  Pamelia Hoit

## 2024-01-13 NOTE — Progress Notes (Signed)
Patient ID: Vanessa [name], female   DOB: 07/12/1951, 73 y.o.   MRN: [id number]  Have completed Access [employer] application and emailed in for eligibility. Will give application back to pt for her records.

## 2024-01-13 NOTE — Progress Notes (Signed)
Inpatient Rehabilitation Care Coordinator Assessment and Plan Patient Details  Name: Vanessa [name] MRN: [id number] Date of Birth: [birth year]  Today's Date: 01/13/2024  Hospital Problems: Principal Problem:   Debility  Past Medical History:  Past Medical History:  Diagnosis Date   CHF (congestive heart failure) (HCC)    CKD (chronic kidney disease)    Heart murmur    Hypertension    Moderate mitral regurgitation    moderate to severe MR with moderate pulmonary HTN   Multinodular goiter    Nonischemic cardiomyopathy (HCC)    EF 30-35% by echo 2015   Pulmonary HTN (HCC)    moderate with PASP by echo 2015   PVC's (premature ventricular contractions)    Past Surgical History:  Past Surgical History:  Procedure Laterality Date   CARDIAC CATHETERIZATION  2003   normal coronary arteries   CARDIAC CATHETERIZATION  2013  Nov   h/o nonischemic DCM EF 35-40% increase LVEDP    EXPLORATION POST OPERATIVE OPEN HEART N/A 11/29/2023   Procedure: EXPLORATION POST OPERATIVE OPEN HEART;  Surgeon: Eugenio Hoes, MD;  Location: [facility] OR;  Service: Open Heart Surgery;  Laterality: N/A;   HYSTEROSCOPY WITH D & C  12/02/2012   Procedure: DILATATION AND CURETTAGE /HYSTEROSCOPY;  Surgeon: Dorien Chihuahua. Richardson Dopp, MD;  Location: [facility] ORS;  Service: Gynecology;  Laterality: N/A;  cervical polypectomy   IR FLUORO GUIDE CV LINE LEFT  12/24/2023   IR FLUORO GUIDE CV LINE RIGHT  12/24/2023   IR US GUIDE VASC ACCESS LEFT  12/24/2023   IR US GUIDE VASC ACCESS RIGHT  12/24/2023   LEFT AND RIGHT HEART CATHETERIZATION WITH CORONARY ANGIOGRAM N/A 02/15/2015   Procedure: LEFT AND RIGHT HEART CATHETERIZATION WITH CORONARY ANGIOGRAM;  Surgeon: Corky Crafts, MD;  Location: [facility] CATH LAB;  Service: Cardiovascular;  Laterality: N/A;   MITRAL VALVE REPLACEMENT N/A 11/29/2023   Procedure: MITRAL VALVE (MV) REPLACEMENT USING MOSAIC 310CINCH TISSUE VALVE;  Surgeon: Eugenio Hoes, MD;  Location: [facility] OR;  Service: Open Heart  Surgery;  Laterality: N/A;   RIGHT HEART CATH N/A 12/17/2023   Procedure: RIGHT HEART CATH;  Surgeon: Laurey Morale, MD;  Location: [facility] INVASIVE CV LAB;  Service: Cardiovascular;  Laterality: N/A;   RIGHT/LEFT HEART CATH AND CORONARY ANGIOGRAPHY N/A 10/12/2023   Procedure: RIGHT/LEFT HEART CATH AND CORONARY ANGIOGRAPHY;  Surgeon: Laurey Morale, MD;  Location: [facility] INVASIVE CV LAB;  Service: Cardiovascular;  Laterality: N/A;   TEE WITHOUT CARDIOVERSION N/A 10/11/2018   Procedure: TRANSESOPHAGEAL ECHOCARDIOGRAM (TEE);  Surgeon: Laurey Morale, MD;  Location: [facility] ENDOSCOPY;  Service: Cardiovascular;  Laterality: N/A;   TEE WITHOUT CARDIOVERSION N/A 09/07/2023   Procedure: TRANSESOPHAGEAL ECHOCARDIOGRAM;  Surgeon: Laurey Morale, MD;  Location: [facility] INVASIVE CV LAB;  Service: Cardiovascular;  Laterality: N/A;   TEE WITHOUT CARDIOVERSION N/A 11/29/2023   Procedure: TRANSESOPHAGEAL ECHOCARDIOGRAM;  Surgeon: Eugenio Hoes, MD;  Location: [facility] OR;  Service: Open Heart Surgery;  Laterality: N/A;   TRANSCATHETER MITRAL EDGE TO EDGE REPAIR N/A 12/14/2018   Procedure: MITRAL VALVE REPAIR;  Surgeon: Tonny Bollman, MD;  Location: [facility] INVASIVE CV LAB;  Service: Cardiovascular;  Laterality: N/A;   TRICUSPID VALVE REPLACEMENT N/A 11/29/2023   Procedure: TRICUSPID VALVE REPAIR USING MC3 EDWARDS TRICUSPID ANNULOPLASTY RING;  Surgeon: Eugenio Hoes, MD;  Location: [facility] OR;  Service: Open Heart Surgery;  Laterality: N/A;   Social History:  reports that she has never smoked. She has never used smokeless tobacco. She reports that she does  not drink alcohol and does not use drugs.  Family / Support Systems Marital Status: Single Patient Roles: Other (Comment) (Sibling and employee) Other Supports: Crystal-niece [phone]  Shirley-friend [phone] [name] [phone] Anticipated Caregiver: Two sister's coming to rotate and assist with care for a short time Ability/Limitations of Caregiver: Can do  supervison-light min will be here a short time Caregiver Availability: 24/7 Family Dynamics: Close with sister's and friends she is very acitve and ws working part time to full her time and do any activity. 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 Return to Work Plans: Does not plan to return this was just a part time job to [employer] active Marine scientist Issues: NA Guardian/Conservator: NA MD feels she is capable of making her own decisions while here   Abuse/Neglect Abuse/Neglect Assessment Can Be Completed: Yes Physical Abuse: Denies Verbal Abuse: Denies Sexual Abuse: Denies Exploitation of patient/patient's resources: Denies Self-Neglect: Denies  Patient response to: Social Isolation - How often do you feel lonely or isolated from those around you?: Rarely  Emotional Status Pt's affect, behavior and adjustment status: Pt has always been very independent and taken care of herself she has always been very independent and hopes to get there once leaves here. She knows she can not drive until MD lets her to will need transport at DC Recent Psychosocial Issues: other health issues Psychiatric History: Hx-anxiety takes medicatons which she finds helpful Substance Abuse History: NA  Patient / Family Perceptions, Expectations & Goals Pt/Family understanding of illness & functional limitations: Pt can explain her heart issues and now kindey issues. She is trying to take each day at a time and get better. Feels understands her treatment plan moving forward. Premorbid pt/family roles/activities: sibling, aunt, employee, church  member Anticipated changes in roles/activities/participation: resume Pt/family expectations/goals: Pt states: " I hope to be able to take care of myself when I leave, my sister;s are coming but I don;t want them to do care."  [employer]: None Premorbid Home Care/DME Agencies: None Transportation available at discharge: self Is the patient able to respond to transportation needs?: Yes In the past 12 months, has lack of transportation kept you from medical appointments or from getting medications?: No In the past 12 months, has lack of transportation kept you from meetings, work, or from getting things needed for daily living?: No Resource referrals recommended: Neuropsychology  Discharge Planning Living Arrangements: Alone Support Systems: Manufacturing engineer, Other relatives, Church/faith community Type of Residence: Private residence Insurance Resources: Media planner (specify) (Health Team Advantage) Financial Resources: Employment, Restaurant manager, fast food Screen Referred: No Living Expenses: Own Money Management: Patient Does the patient have any problems obtaining your medications?: No Home Management: self Patient/Family Preliminary Plans: Return home with her two sister's coming-rotating to assist her at discharge. She is a new HD and will ned to coordinate with renal navigator and work on transportation issue Care Coordinator Barriers to Discharge: Insurance for SNF coverage, Weight bearing restrictions Care Coordinator Anticipated Follow Up Needs: HH/OP  Clinical Impression Pleasant motivated female who is used to being very independent and active. Her two sister's are coming to assist at discharge. Will assist with Access [employer] application for transport  and await team's evaluations  [name] 01/13/2024, 9:14 AM

## 2024-01-13 NOTE — Progress Notes (Signed)
Inpatient Rehabilitation Center Individual Statement of Services  Patient Name:  Vanessa [name]  Date:  01/13/2024  Welcome to the Inpatient Rehabilitation Center.  Our goal is to provide you with an individualized program based on your diagnosis and situation, designed to meet your specific needs.  With this comprehensive rehabilitation program, you will be expected to participate in at least 3 hours of rehabilitation therapies Monday-Friday, with modified therapy programming on the weekends.  Your rehabilitation program will include the following services:  Physical Therapy (PT), Occupational Therapy (OT), 24 hour per day rehabilitation nursing, Therapeutic Recreaction (TR), Neuropsychology, Care Coordinator, Rehabilitation Medicine, Nutrition Services, and Pharmacy Services  Weekly team conferences will be held on Wednesday to discuss your progress.  Your Inpatient Rehabilitation Care Coordinator will talk with you frequently to get your input and to update you on team discussions.  Team conferences with you and your family in attendance may also be held.  Expected length of stay: 10-12 days  Overall anticipated outcome: supervision with cues  Depending on your progress and recovery, your program may change. Your Inpatient Rehabilitation Care Coordinator will coordinate services and will keep you informed of any changes. Your Inpatient Rehabilitation Care Coordinator's name and contact numbers are listed  below.  The following services may also be recommended but are not provided by the Inpatient Rehabilitation Center:  Driving Evaluations Home Health Rehabiltiation Services Outpatient Rehabilitation Services Vocational Rehabilitation   Arrangements will be made to provide these services after discharge if needed.  Arrangements include referral to agencies that provide these services.  Your insurance has been verified to be:  Health Team Advantage Your primary doctor is:  Pharmacologist  Pertinent information will be shared with your doctor and your insurance company.  Inpatient Rehabilitation Care Coordinator:  Dossie Der, [place] [phone] or Luna Glasgow  Information discussed with and copy given to patient by: [name], 01/13/2024, 9:15 AM

## 2024-01-13 NOTE — Plan of Care (Signed)
Problem: Education: Goal: Knowledge of General Education information will improve Description: Including pain rating scale, medication(s)/side effects and non-pharmacologic comfort measures 01/13/2024 0830 by Luther Redo, RN Outcome: Progressing 01/13/2024 0828 by Luther Redo, RN Outcome: Progressing 01/13/2024 0827 by Luther Redo, RN Outcome: Progressing 01/12/2024 2148 by Luther Redo, RN Outcome: Progressing   Problem: Health Behavior/Discharge Planning: Goal: Ability to manage health-related needs will improve 01/13/2024 0830 by Luther Redo, RN Outcome: Progressing 01/13/2024 0828 by Luther Redo, RN Outcome: Progressing 01/13/2024 0827 by Luther Redo, RN Outcome: Progressing 01/12/2024 2148 by Luther Redo, RN Outcome: Progressing   Problem: Clinical Measurements: Goal: Ability to maintain clinical measurements within normal limits will improve 01/13/2024 0830 by Luther Redo, RN Outcome: Progressing 01/13/2024 0828 by Luther Redo, RN Outcome: Progressing 01/13/2024 0827 by Luther Redo, RN Outcome: Progressing 01/12/2024 2148 by Luther Redo, RN Outcome: Progressing Goal: Will remain free from infection 01/13/2024 0830 by Luther Redo, RN Outcome: Progressing 01/13/2024 0828 by Luther Redo, RN Outcome: Progressing 01/13/2024 0827 by Luther Redo, RN Outcome: Progressing 01/12/2024 2148 by Luther Redo, RN Outcome: Progressing Goal: Diagnostic test results will improve 01/13/2024 0830 by Luther Redo, RN Outcome: Progressing 01/13/2024 0828 by Luther Redo, RN Outcome: Progressing 01/13/2024 0827 by Luther Redo, RN Outcome: Progressing 01/12/2024 2148 by Luther Redo, RN Outcome: Progressing Goal: Respiratory complications will improve 01/13/2024 0830 by Luther Redo, RN Outcome: Progressing 01/13/2024 0828 by Luther Redo, RN Outcome:  Progressing 01/13/2024 0827 by Luther Redo, RN Outcome: Progressing 01/12/2024 2148 by Luther Redo, RN Outcome: Progressing Goal: Cardiovascular complication will be avoided 01/13/2024 0830 by Luther Redo, RN Outcome: Progressing 01/13/2024 0828 by Luther Redo, RN Outcome: Progressing 01/13/2024 0827 by Luther Redo, RN Outcome: Progressing 01/12/2024 2148 by Luther Redo, RN Outcome: Progressing   Problem: Activity: Goal: Risk for activity intolerance will decrease 01/13/2024 0830 by Luther Redo, RN Outcome: Progressing 01/13/2024 0828 by Luther Redo, RN Outcome: Progressing 01/13/2024 0827 by Luther Redo, RN Outcome: Progressing 01/12/2024 2148 by Luther Redo, RN Outcome: Progressing   Problem: Nutrition: Goal: Adequate nutrition will be maintained 01/13/2024 0830 by Luther Redo, RN Outcome: Progressing 01/13/2024 0828 by Luther Redo, RN Outcome: Progressing 01/13/2024 0827 by Luther Redo, RN Outcome: Progressing 01/12/2024 2148 by Luther Redo, RN Outcome: Progressing   Problem: Coping: Goal: Level of anxiety will decrease 01/13/2024 0830 by Luther Redo, RN Outcome: Progressing 01/13/2024 0828 by Luther Redo, RN Outcome: Progressing 01/13/2024 0827 by Luther Redo, RN Outcome: Progressing 01/12/2024 2148 by Luther Redo, RN Outcome: Progressing   Problem: Elimination: Goal: Will not experience complications related to bowel motility 01/13/2024 0830 by Luther Redo, RN Outcome: Progressing 01/13/2024 0828 by Luther Redo, RN Outcome: Progressing 01/13/2024 0827 by Luther Redo, RN Outcome: Progressing 01/12/2024 2148 by Luther Redo, RN Outcome: Progressing Goal: Will not experience complications related to urinary retention 01/13/2024 0830 by Luther Redo, RN Outcome: Progressing 01/13/2024 0828 by Luther Redo,  RN Outcome: Progressing 01/13/2024 0827 by Luther Redo, RN Outcome: Progressing 01/12/2024 2148 by Luther Redo, RN Outcome: Progressing   Problem: Pain Managment: Goal: General experience of comfort will improve and/or be controlled 01/13/2024 0830 by Luther Redo, RN Outcome: Progressing 01/13/2024 0828 by Luther Redo, RN Outcome: Progressing 01/13/2024 0827 by Luther Redo, RN Outcome: Progressing 01/12/2024 2148 by Luther Redo, RN Outcome: Progressing   Problem: Safety: Goal: Ability to remain free from injury will improve 01/13/2024 0830 by Luther Redo, RN Outcome: Progressing 01/13/2024 0828 by Luther Redo, RN Outcome: Progressing 01/13/2024 0827 by Luther Redo, RN Outcome: Progressing 01/12/2024 2148 by  Jewelz Ricklefs, RN Outcome: Progressing   Problem: Skin Integrity: Goal: Risk for impaired skin integrity will decrease 01/13/2024 0830 by Luther Redo, RN Outcome: Progressing 01/13/2024 0828 by Luther Redo, RN Outcome: Progressing 01/13/2024 0827 by Luther Redo, RN Outcome: Progressing 01/12/2024 2148 by Luther Redo, RN Outcome: Progressing

## 2024-01-13 NOTE — Plan of Care (Signed)
  Problem: RH Balance Goal: LTG Patient will maintain dynamic standing with ADLs (OT) Description: LTG:  Patient will maintain dynamic standing balance with assist during activities of daily living (OT)  Flowsheets (Taken 01/13/2024 1337) LTG: Pt will maintain dynamic standing balance during ADLs with: Supervision/Verbal cueing   Problem: Sit to Stand Goal: LTG:  Patient will perform sit to stand in prep for activites of daily living with assistance level (OT) Description: LTG:  Patient will perform sit to stand in prep for activites of daily living with assistance level (OT) Flowsheets (Taken 01/13/2024 1337) LTG: PT will perform sit to stand in prep for activites of daily living with assistance level: Supervision/Verbal cueing   Problem: RH Bathing Goal: LTG Patient will bathe all body parts with assist levels (OT) Description: LTG: Patient will bathe all body parts with assist levels (OT) Flowsheets (Taken 01/13/2024 1337) LTG: Pt will perform bathing with assistance level/cueing: Supervision/Verbal cueing   Problem: RH Dressing Goal: LTG Patient will perform upper body dressing (OT) Description: LTG Patient will perform upper body dressing with assist, with/without cues (OT). Flowsheets (Taken 01/13/2024 1337) LTG: Pt will perform upper body dressing with assistance level of: Set up assist Goal: LTG Patient will perform lower body dressing w/assist (OT) Description: LTG: Patient will perform lower body dressing with assist, with/without cues in positioning using equipment (OT) Flowsheets (Taken 01/13/2024 1337) LTG: Pt will perform lower body dressing with assistance level of: Supervision/Verbal cueing   Problem: RH Toileting Goal: LTG Patient will perform toileting task (3/3 steps) with assistance level (OT) Description: LTG: Patient will perform toileting task (3/3 steps) with assistance level (OT)  Flowsheets (Taken 01/13/2024 1337) LTG: Pt will perform toileting task (3/3 steps)  with assistance level: Supervision/Verbal cueing   Problem: RH Toilet Transfers Goal: LTG Patient will perform toilet transfers w/assist (OT) Description: LTG: Patient will perform toilet transfers with assist, with/without cues using equipment (OT) Flowsheets (Taken 01/13/2024 1337) LTG: Pt will perform toilet transfers with assistance level of: Supervision/Verbal cueing

## 2024-01-13 NOTE — Evaluation (Signed)
Occupational Therapy Assessment and Plan  Patient Details  Name: Vanessa [name] MRN: [id number] Date of Birth: 09-17-1951  OT Diagnosis: muscle weakness (generalized)  and disuse atrophy Rehab Potential: Rehab Potential (ACUTE ONLY): Good ELOS: 12-14 days   Today's Date: 01/13/2024 OT Individual Time: 1045-1200 OT Individual Time Calculation (min): 75 min     Hospital Problem: Principal Problem:   Debility   Past Medical History:  Past Medical History:  Diagnosis Date   CHF (congestive heart failure) (HCC)    CKD (chronic kidney disease)    Heart murmur    Hypertension    Moderate mitral regurgitation    moderate to severe MR with moderate pulmonary HTN   Multinodular goiter    Nonischemic cardiomyopathy (HCC)    EF 30-35% by echo 2015   Pulmonary HTN (HCC)    moderate with PASP by echo 2015   PVC's (premature ventricular contractions)    Past Surgical History:  Past Surgical History:  Procedure Laterality Date   CARDIAC CATHETERIZATION  2003   normal coronary arteries   CARDIAC CATHETERIZATION  2013  Nov   h/o nonischemic DCM EF 35-40% increase LVEDP    EXPLORATION POST OPERATIVE OPEN HEART N/A 11/29/2023   Procedure: EXPLORATION POST OPERATIVE OPEN HEART;  Surgeon: Eugenio Hoes, MD;  Location: [facility] OR;  Service: Open Heart Surgery;  Laterality: N/A;   HYSTEROSCOPY WITH D & C  12/02/2012   Procedure: DILATATION AND CURETTAGE /HYSTEROSCOPY;  Surgeon: Dorien Chihuahua. Richardson Dopp, MD;  Location: [facility] ORS;  Service: Gynecology;  Laterality: N/A;  cervical polypectomy   IR FLUORO GUIDE CV LINE LEFT  12/24/2023   IR FLUORO GUIDE CV LINE RIGHT  12/24/2023   IR US GUIDE VASC ACCESS LEFT  12/24/2023   IR US GUIDE VASC ACCESS RIGHT  12/24/2023   LEFT AND RIGHT HEART CATHETERIZATION WITH CORONARY ANGIOGRAM N/A 02/15/2015   Procedure: LEFT AND RIGHT HEART CATHETERIZATION WITH CORONARY ANGIOGRAM;  Surgeon: Corky Crafts, MD;  Location: [facility] CATH LAB;  Service: Cardiovascular;  Laterality: N/A;    MITRAL VALVE REPLACEMENT N/A 11/29/2023   Procedure: MITRAL VALVE (MV) REPLACEMENT USING MOSAIC 310CINCH TISSUE VALVE;  Surgeon: Eugenio Hoes, MD;  Location: [facility] OR;  Service: Open Heart Surgery;  Laterality: N/A;   RIGHT HEART CATH N/A 12/17/2023   Procedure: RIGHT HEART CATH;  Surgeon: Laurey Morale, MD;  Location: [facility] INVASIVE CV LAB;  Service: Cardiovascular;  Laterality: N/A;   RIGHT/LEFT HEART CATH AND CORONARY ANGIOGRAPHY N/A 10/12/2023   Procedure: RIGHT/LEFT HEART CATH AND CORONARY ANGIOGRAPHY;  Surgeon: Laurey Morale, MD;  Location: [facility] INVASIVE CV LAB;  Service: Cardiovascular;  Laterality: N/A;   TEE WITHOUT CARDIOVERSION N/A 10/11/2018   Procedure: TRANSESOPHAGEAL ECHOCARDIOGRAM (TEE);  Surgeon: Laurey Morale, MD;  Location[facility] ENDOSCOPY;  Service: Cardiovascular;  Laterality: N/A;   TEE WITHOUT CARDIOVERSION N/A 09/07/2023   Procedure: TRANSESOPHAGEAL ECHOCARDIOGRAM;  Surgeon: Laurey Morale, MD;  Location: [facility] INVASIVE CV LAB;  Service: Cardiovascular;  Laterality: N/A;   TEE WITHOUT CARDIOVERSION N/A 11/29/2023   Procedure: TRANSESOPHAGEAL ECHOCARDIOGRAM;  Surgeon: Eugenio Hoes, MD;  Location: [facility] OR;  Service: Open Heart Surgery;  Laterality: N/A;   TRANSCATHETER MITRAL EDGE TO EDGE REPAIR N/A 12/14/2018   Procedure: MITRAL VALVE REPAIR;  Surgeon: Tonny Bollman, MD;  Location: [facility] INVASIVE CV LAB;  Service: Cardiovascular;  Laterality: N/A;   TRICUSPID VALVE REPLACEMENT N/A 11/29/2023   Procedure: TRICUSPID VALVE REPAIR USING MC3 EDWARDS TRICUSPID ANNULOPLASTY RING;  Surgeon: Eugenio Hoes,  MD;  Location: [facility] OR;  Service: Open Heart Surgery;  Laterality: N/A;    Assessment & Plan Clinical Impression: .Vanessa [name] a 73 year old female with history of CKD, NICM with EF 40-45%, severe MR s/p mitral clip X 2 who started developing LE edema with DOE due to severe MR. She was admitted on 11/29/23 for MVR with mosaic porcine tissue valve and tricupid annuloplasty  ring by Dr. Leafy Ro. Post op with hypovolemic cardiogenic shock due to cardiac tamponade and taken back to OR that evening for reexploration with evacuation of large amount of clot in precardial and pleural spaces but continued to ooze requiring 3 units PRBC, 4 FFP, 1 plt and cryoprecipitate.  Post op NSVT treated with amiodarone and developed respiratory distress due to fluid overload requiring reintubation on 01/02. CRRT initiated  for ATN and cardiorenal syndrome in setting of shock and started on TF for nutritional support.    Multifactorial shock treated with pressors, stress dose steroids and she tolerated extubation by 01/05. She was started on IV heparin due to persistent A fib and midodrine added to help with BP support. She had issues with abdominal pain and rise in bilirubin to 15 but abdominal ultrasound showed dilated GB without thickening, fluid, stones and was negative for ductal dilatation.  Leucocytosis treated with broad spectrum antibiotics and no source of infection found.  CT abdomen/pelvis 01/13 showed moderate hemorrhagic pericardial effusion with 5.3 cm hematoma and RUL atelectasis without gallstones. 2D echo repeated 01/14 revealing EF 35-40%, trivial MR and no evidence of pericardial effusion. HD cath removed on 01/14 but continued to require CRRT and milirionone added to help with fluid status on 01/17.     ABLA treated with addition of aranesp and thrombocytopenia resolved off heparin. She has also had issues with anxiety, depression, insomnia, nausea as well as ongoing need for pressors. CRRT stopped on 01/20 and Droxidopa added 01/21 to aid weaning of pressors. She was noted to be anuric therefore tunneled catheter placed right neck and internal jugular in left neck by Dr. Grace Isaac and CRRT resumed 01/22. As BP stabilized, she was transitioned to HD and reporting some nausea and fatigue SE.PT/OT consulted and patient was noted to be limited by sternal precautions, debility, dizziness  with increase in activity and is requiring mod assist with ADLs and mobility. CIR recommended due to functional decline. Reports fatigue.    Patient transferred to CIR on 01/12/2024 .    Patient currently requires max with basic self-care skills secondary to muscle weakness, decreased cardiorespiratoy endurance, and decreased standing balance.  Prior to hospitalization, patient was fully independent.   Patient will benefit from skilled intervention to increase independence with basic self-care skills prior to discharge home with care partner.  Anticipate patient will require intermittent supervision and follow up home health.  OT - End of Session Activity Tolerance: Tolerates 10 - 20 min activity with multiple rests Endurance Deficit: Yes Endurance Deficit Description: pt easily fatigued with minimal activity and required frequent rest breaks, pt became nauseated during session OT Assessment Rehab Potential (ACUTE ONLY): Good OT Patient demonstrates impairments in the following area(s): Balance;Endurance;Motor OT Basic ADL's Functional Problem(s): Bathing;Dressing;Toileting OT Transfers Functional Problem(s): Toilet OT Additional Impairment(s): None OT Plan OT Intensity: Minimum of 1-2 x/day, 45 to 90 minutes OT Frequency: 5 out of 7 days OT Duration/Estimated Length of Stay: 12-14 days OT Treatment/Interventions: Social worker;Discharge planning;Functional mobility training;Psychosocial support;Patient/family education;Self Care/advanced ADL retraining;UE/LE Strength taining/ROM;Therapeutic Exercise;Therapeutic Activities OT Self Feeding Anticipated  Outcome(s): no goal, pt is independent OT Basic Self-Care Anticipated Outcome(s): supervision OT Toileting Anticipated Outcome(s): supervision OT Bathroom Transfers Anticipated Outcome(s): supervision OT Recommendation Patient destination: Home Follow Up Recommendations: Home health  OT Equipment Recommended: 3 in 1 bedside comode Equipment Details: drop arm BSC   OT Evaluation Precautions/Restrictions  Precautions Precautions: Fall;Sternal Restrictions Other Position/Activity Restrictions: sternal precautions   Pain Pain Assessment Pain Scale: 0-10 Pain Score: 0-No pain Home Living/Prior Functioning Home Living Family/patient expects to be discharged to:: Private residence Living Arrangements: Alone Available Help at Discharge: Family, Available 24 hours/day Type of Home: House Home Access: Level entry Home Layout: One level Bathroom Shower/Tub: Curtain, [employer] operational [employer] Accessibility: Yes Additional Comments: has no DME and was independent with mobility prior to admission  Lives With: Alone Prior Function Level of Independence: Independent with basic ADLs, Independent with gait, Independent with homemaking with ambulation, Independent with transfers  Able to Take Stairs?: Yes Driving: Yes Vocation: Part time employment 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 Vision Baseline Vision/History: 0 No visual deficits Ability to See in Adequate Light: 0 Adequate Patient Visual Report: No change from baseline Vision Assessment?: No apparent visual deficits Additional Comments: not formally assessed Perception  Perception: Within Functional Limits Praxis Praxis: WFL Cognition Cognition Overall Cognitive Status: Within Functional Limits for tasks assessed Arousal/Alertness: Awake/alert Memory: Appears intact Awareness: Appears intact Problem Solving: Appears intact Safety/Judgment: Appears intact Brief Interview for Mental Status (BIMS) Repetition of Three Words (First Attempt): 3 Temporal Orientation: Year: Correct Temporal Orientation: Month: Accurate within 5 days Temporal Orientation: Day: Correct Recall: "Sock": Yes, no cue required Recall: "Blue": Yes, no cue required Recall: "Bed":  Yes, no cue required BIMS Summary Score: 15 Sensation Sensation Light Touch: Appears Intact Hot/Cold: Appears Intact Proprioception: Appears Intact Stereognosis: Appears Intact Coordination Gross Motor Movements are Fluid and Coordinated: No Fine Motor Movements are Fluid and Coordinated: No Coordination and Movement Description: global weakness/deconditioning and fatigue Finger Nose Finger Test: slow and mild dysmetria on LUE - pt reports occasional tremors in bilateral hands Heel Shin Test: decreased ROM on RLE>LLE Motor  Motor Motor: Within Functional Limits Motor - Skilled Clinical Observations: global weakness/deconditioning and fatigue  Trunk/Postural Assessment  Cervical Assessment Cervical Assessment: Within Functional Limits Thoracic Assessment Thoracic Assessment: Exceptions to [facility] (rounded shoulders) Lumbar Assessment Lumbar Assessment: Exceptions to [facility] (posterior pelvic tilt) Postural Control Postural Control: Within Functional Limits  Balance Balance Balance Assessed: Yes Static Sitting Balance Static Sitting - Balance Support: Feet supported;Bilateral upper extremity supported Static Sitting - Level of Assistance: 5: Stand by assistance Dynamic Sitting Balance Dynamic Sitting - Balance Support: Feet supported;No upper extremity supported Dynamic Sitting - Level of Assistance: 5: Stand by assistance Static Standing Balance Static Standing - Balance Support: Bilateral upper extremity supported;During functional activity;No upper extremity supported Static Standing - Level of Assistance: 4: Min assist Dynamic Standing Balance Dynamic Standing - Balance Support: Bilateral upper extremity supported;During functional activity (RW) Dynamic Standing - Level of Assistance: 4: Min assist Dynamic Standing - Comments: with transfers and gait Extremity/Trunk Assessment RUE Assessment General Strength Comments: not tested due to sternal precautions,  pt reports no  strength changes in UEs LUE Assessment General Strength Comments: not tested due to sternal precautions, pt reports no strength changes in UEs  Care Tool Care Tool Self Care Eating   Eating Assist Level: Independent    Oral Care    Oral Care Assist Level: Set up assist    Bathing  Body parts bathed by patient: Right arm;Left arm;Chest;Abdomen;Front perineal area;Right upper leg;Left upper leg;Face Body parts bathed by helper: Left lower leg;Right lower leg;Buttocks   Assist Level: Moderate Assistance - Patient 50 - 74%    Upper Body Dressing(including orthotics)   What is the patient wearing?: Hospital gown only   Assist Level: Minimal Assistance - Patient > 75%    Lower Body Dressing (excluding footwear)   What is the patient wearing?: Hospital gown only Assist for lower body dressing: Moderate Assistance - Patient 50 - 74%    Putting on/Taking off footwear   What is the patient wearing?: Ted hose;Non-skid slipper socks Assist for footwear: Total Assistance - Patient < 25%       Care Tool Toileting Toileting activity   Assist for toileting: Maximal Assistance - Patient 25 - 49%     Care Tool Bed Mobility Roll left and right activity   Roll left and right assist level: Minimal Assistance - Patient > 75% (flat bed, no bedrails)    Sit to lying activity        Lying to sitting on side of bed activity   Lying to sitting on side of bed assist level: the ability to move from lying on the back to sitting on the side of the bed with no back support.: Moderate Assistance - Patient 50 - 74% (flat bed, no bedrails)     Care Tool Transfers Sit to stand transfer   Sit to stand assist level: Maximal Assistance - Patient 25 - 49% (low sitting WC)    Chair/bed transfer   Chair/bed transfer assist level: Minimal Assistance - Patient > 75%     Toilet transfer   Assist Level: Minimal Assistance - Patient > 75%     Care Tool Cognition  Expression of Ideas and Wants  Expression of Ideas and Wants: 4. Without difficulty (complex and basic) - expresses complex messages without difficulty and with speech that is clear and easy to understand  Understanding Verbal and Non-Verbal Content Understanding Verbal and Non-Verbal Content: 4. Understands (complex and basic) - clear comprehension without cues or repetitions   Memory/Recall Ability Memory/Recall Ability : Current season;That he or she is in a hospital/hospital unit   Refer to Care Plan for Long Term Goals  SHORT TERM GOAL WEEK 1 OT Short Term Goal 1 (Week 1): Pt will demonstrate improved sit to stand strength to be able to rise to stand from low w/c seat with min A. OT Short Term Goal 2 (Week 1): Pt will be able to stand for at least 3 minutes to engage in LB bathing tasks for bottom and perineal area. OT Short Term Goal 3 (Week 1): Pt will be able to don pull on pants with min A. OT Short Term Goal 4 (Week 1): Pt will complete toilet transfer and ambulation to toilet with RW with CGA.  Recommendations for other services: None    Skilled Therapeutic Intervention ADL ADL Eating: Independent Grooming: Setup Where Assessed-Grooming: Sitting at sink Upper Body Bathing: Supervision/safety Where Assessed-Upper Body Bathing: Sitting at sink Lower Body Bathing: Moderate assistance Where Assessed-Lower Body Bathing: Sitting at sink Upper Body Dressing: Minimal assistance Lower Body Dressing: Moderate assistance Toileting: Maximal assistance Toilet Transfer: Moderate assistance Toilet Transfer Method: Stand pivot Mobility  Bed Mobility Bed Mobility: Rolling Left;Supine to Sit Rolling Left: Minimal Assistance - Patient > 75% Supine to Sit: Moderate Assistance - Patient 50-74% Transfers Sit to Stand: Maximal Assistance - Patient 25-49% Stand to Sit: Moderate  Assistance - Patient 50-74%  Pt seen for initial evaluation and ADL training with a focus on initiation of functional mobility.   Reviewed role  of OT, discussed POC and pt's goals, and ELOS and potential DME needs.   Pt received in w/c, had pt attempt 2-3 sit to stands with no UE support (arms crossed) and she needed a heavy max A.  With using hands on sides of w/c with arms close to torso pt able to push up with triceps strength and a heavy mod A.  With 2 person support (one on each side supporting under arms) pt stood with only min A.  Once standing pt able to use RW to ambulate from w/c to recliner. Prior to that, pt sat in w/c at sink to complete grooming and bathing,  did not have clothing to dress in and pt declined paper scrubs. Obtained 2 new gowns for pt to use. With effort put in with sit to stands, pt did become nauseated and vomited a small amount. She stated this has been happening recently. Discussed what juices she has been drinking and advised to try cranberry or grape vs the orange due to the acidity.   Discussed home layout with low toilet. Recommend a drop arm BSC for home to use over toilet and to have as an option at night if needed. Pt is no longer voiding urine due to dialysis, but will need to toilet for bowel movements.   Spent time discussing her difficult medical experience and the life changes, how to handle rehab, having realistic and reasonable goals for herself.  Pt sat recliner at end of session with feet elevated. All needs met, belt alarm on.  Pt resting in ... With all needs met and ...alarm set.     Discharge Criteria: Patient will be discharged from OT if patient refuses treatment 3 consecutive times without medical reason, if treatment goals not met, if there is a change in medical status, if patient makes no progress towards goals or if patient is discharged from hospital.  The above assessment, treatment plan, treatment alternatives and goals were discussed and mutually agreed upon: by patient  [facility] 01/13/2024, 1:38 PM

## 2024-01-13 NOTE — Evaluation (Signed)
Physical Therapy Assessment and Plan  Patient Details  Name: Vanessa [name] MRN: [id number] Date of Birth: [birth year]  PT Diagnosis: Abnormal posture, Abnormality of gait, Difficulty walking, Edema, and Muscle weakness Rehab Potential: Good ELOS: 10-12 days   Today's Date: 01/13/2024 PT Individual Time: 4098-1191 PT Individual Time Calculation (min): 70 min    Hospital Problem: Principal Problem:   Debility   Past Medical History:  Past Medical History:  Diagnosis Date   CHF (congestive heart failure) (HCC)    CKD (chronic kidney disease)    Heart murmur    Hypertension    Moderate mitral regurgitation    moderate to severe MR with moderate pulmonary HTN   Multinodular goiter    Nonischemic cardiomyopathy (HCC)    EF 30-35% by echo 2015   Pulmonary HTN (HCC)    moderate with PASP by echo 2015   PVC's (premature ventricular contractions)    Past Surgical History:  Past Surgical History:  Procedure Laterality Date   CARDIAC CATHETERIZATION  2003   normal coronary arteries   CARDIAC CATHETERIZATION  2013  Nov   h/o nonischemic DCM EF 35-40% increase LVEDP    EXPLORATION POST OPERATIVE OPEN HEART N/A 11/29/2023   Procedure: EXPLORATION POST OPERATIVE OPEN HEART;  Surgeon: Eugenio Hoes, MD;  Location: [facility] OR;  Service: Open Heart Surgery;  Laterality: N/A;   HYSTEROSCOPY WITH D & C  12/02/2012   Procedure: DILATATION AND CURETTAGE /HYSTEROSCOPY;  Surgeon: Dorien Chihuahua. Richardson Dopp, MD;  Location: [facility] ORS;  Service: Gynecology;  Laterality: N/A;  cervical polypectomy   IR FLUORO GUIDE CV LINE LEFT  12/24/2023   IR FLUORO GUIDE CV LINE RIGHT  12/24/2023   IR US GUIDE VASC ACCESS LEFT  12/24/2023   IR US GUIDE VASC ACCESS RIGHT  12/24/2023   LEFT AND RIGHT HEART CATHETERIZATION WITH CORONARY ANGIOGRAM N/A 02/15/2015   Procedure: LEFT AND RIGHT HEART CATHETERIZATION WITH CORONARY ANGIOGRAM;  Surgeon: Corky Crafts, MD;  Location: [facility] CATH LAB;  Service: Cardiovascular;  Laterality:  N/A;   MITRAL VALVE REPLACEMENT N/A 11/29/2023   Procedure: MITRAL VALVE (MV) REPLACEMENT USING MOSAIC 310CINCH TISSUE VALVE;  Surgeon: Eugenio Hoes, MD;  Location: [facility] OR;  Service: Open Heart Surgery;  Laterality: N/A;   RIGHT HEART CATH N/A 12/17/2023   Procedure: RIGHT HEART CATH;  Surgeon: Laurey Morale, MD;  [facility] INVAS[facility] CV LAB;  Service: Cardiovascular;  Laterality: N/A;   RIGHT/LEFT HEART CATH AND CORONARY ANGIOGRAPHY N/A 10/12/2023   Procedure: RIGHT/LEFT HEART CATH AND CORONARY ANGIOGRAPHY;  Surgeon: Laurey Morale, MD;  Location: [facility] INVASIVE CV LAB;  Service: Cardiovascular;  Laterality: N/A;   TEE WITHOUT CARDIOVERSION N/A 10/11/2018   Procedure: TRANSESOPHAGEAL ECHOCARDIOGRAM (TEE);  Surgeon: Laurey Morale, MD;  Location: [facility] ENDOSCOPY;  Service: Cardiovascular;  Laterality: N/A;   TEE WITHOUT CARDIOVERSION N/A 09/07/2023   Procedure: TRANSESOPHAGEAL ECHOCARDIOGRAM;  Surgeon: Laurey Morale, MD;  Location: [facility] INVASIVE CV LAB;  Service: Cardiovascular;  Laterality: N/A;   TEE WITHOUT CARDIOVERSION N/A 11/29/2023   Procedure: TRANSESOPHAGEAL ECHOCARDIOGRAM;  Surgeon: Eugenio Hoes, MD;  Location: [facility] OR;  Service: Open Heart Surgery;  Laterality: N/A;   TRANSCATHETER MITRAL EDGE TO EDGE REPAIR N/A 12/14/2018   Procedure: MITRAL VALVE REPAIR;  Surgeon: Tonny Bollman, MD;  Location: [facility] INVASIVE CV LAB;  Service: Cardiovascular;  Laterality: N/A;   TRICUSPID VALVE REPLACEMENT N/A 11/29/2023   Procedure: TRICUSPID VALVE REPAIR USING MC3 EDWARDS TRICUSPID ANNULOPLASTY RING;  Surgeon: Eugenio Hoes, MD;  Location: [facility] OR;  Service: Open Heart Surgery;  Laterality: N/A;    Assessment & Plan Clinical Impression: Patient is a 73 y.o. year old female with history of CKD, NICM with EF 40-45%, severe MR s/p mitral clip X 2 who started developing LE edema with DOE due to severe MR. She was admitted on 11/29/23 for MVR with mosaic porcine tissue valve and tricupid  annuloplasty ring by Dr. Leafy Ro. Post op with hypovolemic cardiogenic shock due to cardiac tamponade and taken back to OR that evening for reexploration with evacuation of large amount of clot in precardial and pleural spaces but continued to ooze requiring 3 units PRBC, 4 FFP, 1 plt and cryoprecipitate.  Post op NSVT treated with amiodarone and developed respiratory distress due to fluid overload requiring reintubation on 01/02. CRRT initiated  for ATN and cardiorenal syndrome in setting of shock and started on TF for nutritional support.    Multifactorial shock treated with pressors, stress dose steroids and she tolerated extubation by 01/05. She was started on IV heparin due to persistent A fib and midodrine added to help with BP support. She had issues with abdominal pain and rise in bilirubin to 15 but abdominal ultrasound showed dilated GB without thickening, fluid, stones and was negative for ductal dilatation.  Leucocytosis treated with broad spectrum antibiotics and no source of infection found.  CT abdomen/pelvis 01/13 showed moderate hemorrhagic pericardial effusion with 5.3 cm hematoma and RUL atelectasis without gallstones. 2D echo repeated 01/14 revealing EF 35-40%, trivial MR and no evidence of pericardial effusion. HD cath removed on 01/14 but continued to require CRRT and milirionone added to help with fluid status on 01/17.     ABLA treated with addition of aranesp and thrombocytopenia resolved off heparin. She has also had issues with anxiety, depression, insomnia, nausea as well as ongoing need for pressors. CRRT stopped on 01/20 and Droxidopa added 01/21 to aid weaning of pressors. She was noted to be anuric therefore tunneled catheter placed right neck and internal jugular in left neck by Dr. Grace Isaac and CRRT resumed 01/22. As BP stabilized, she was transitioned to HD and reporting some nausea and fatigue SE.PT/OT consulted and patient was noted to be limited by sternal precautions,  debility, dizziness with increase in activity and is requiring mod assist with ADLs and mobility. CIR recommended due to functional decline. Reports fatigue.  Patient currently requires mod with mobility secondary to muscle weakness, decreased cardiorespiratoy endurance, and decreased standing balance, decreased postural control, decreased balance strategies, and difficulty maintaining precautions.  Prior to hospitalization, patient was independent  with mobility and lived with Alone in a House home.  Home access is  Level entry.  Patient will benefit from skilled PT intervention to maximize safe functional mobility, minimize fall risk, and decrease caregiver burden for planned discharge home with 24 hour supervision.  Anticipate patient will benefit from follow up HH at discharge.  PT - End of Session Activity Tolerance: Tolerates 30+ min activity with multiple rests Endurance Deficit: Yes Endurance Deficit Description: pt easily fatigued with minimal activity and required frequent rest breaks PT Assessment Rehab Potential (ACUTE/IP ONLY): Good PT Barriers to Discharge: Hemodialysis;Weight;Weight bearing restrictions PT Barriers to Discharge Comments: sternal precautions, HD M/W/F, body habitus, deconditioning/fatigue PT Patient demonstrates impairments in the following area(s): Balance;Edema;Endurance;Nutrition;Skin Integrity PT Transfers Functional Problem(s): Bed Mobility;Bed to Chair;Car;Furniture PT Locomotion Functional Problem(s): Ambulation;Wheelchair Mobility;Stairs PT Plan PT Intensity: Minimum of 1-2 x/day ,45 to 90 minutes PT Frequency: 5 out of 7 days PT  Duration Estimated Length of Stay: 10-12 days PT Treatment/Interventions: Ambulation/gait training;Discharge planning;Functional mobility training;Psychosocial support;Therapeutic Activities;Balance/vestibular training;Disease management/prevention;Neuromuscular re-education;Skin care/wound management;Therapeutic  Exercise;Wheelchair propulsion/positioning;DME/adaptive equipment instruction;Pain management;Splinting/orthotics;UE/LE Strength taining/ROM;Community reintegration;Patient/family education;Stair training;UE/LE Coordination activities PT Transfers Anticipated Outcome(s): supervision with LRAD PT Locomotion Anticipated Outcome(s): supervision with LRAD PT Recommendation Follow Up Recommendations: Home health PT Patient destination: Home Equipment Recommended: To be determined Equipment Details: has none   PT Evaluation Precautions/Restrictions Precautions Precautions: Fall;Sternal Restrictions Weight Bearing Restrictions Per Provider Order: No Pain Interference Pain Interference Pain Effect on Sleep: 0. Does not apply - I have not had any pain or hurting in the past 5 days Pain Interference with Therapy Activities: 0. Does not apply - I have not received rehabilitationtherapy in the past 5 days Pain Interference with Day-to-Day Activities: 1. Rarely or not at all Home Living/Prior Functioning Home Living Living Arrangements: Alone Available Help at Discharge: Family;Available 24 hours/day (has 2 sisters who will alternate staying with pt and providing 24/7 assist) Type of Home: House Home Access: Level entry Home Layout: One level Bathroom Shower/Tub: Engineer, manufacturing systems: Standard Bathroom Accessibility: Yes Additional Comments: has no DME and was independent with mobility prior to admission  Lives With: Alone Prior Function Level of Independence: Independent with basic ADLs;Independent with gait;Independent with homemaking with ambulation;Independent with transfers  Able to Take Stairs?: Yes Driving: Yes Vocation: Part time employment 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 Vision/Perception  Vision - History Ability to See in Adequate Light: 0 Adequate Vision - Assessment Additional Comments: not formally assessed  Cognition Overall  Cognitive Status: Within Functional Limits for tasks assessed Arousal/Alertness: Awake/alert Orientation Level: Oriented X4 Memory: Appears intact Awareness: Appears intact Problem Solving: Appears intact Safety/Judgment: Appears intact Sensation Sensation Light Touch: Appears Intact Hot/Cold: Not tested Proprioception: Appears Intact Stereognosis: Not tested Coordination Gross Motor Movements are Fluid and Coordinated: Yes Fine Motor Movements are Fluid and Coordinated: No Coordination and Movement Description: global weakness/deconditioning and fatigue Finger Nose Finger Test: slow and mild dysmetria on LUE - pt reports occasional tremors in bilateral hands Heel Shin Test: decreased ROM on RLE>LLE Motor  Motor Motor: Within Functional Limits Motor - Skilled Clinical Observations: global weakness/deconditioning and fatigue  Trunk/Postural Assessment  Cervical Assessment Cervical Assessment: Within Functional Limits Thoracic Assessment Thoracic Assessment: Exceptions to [facility] (rounded shoulders) Lumbar Assessment Lumbar Assessment: Exceptions to [facility] (posterior pelvic tilt) Postural Control Postural Control: Within Functional Limits  Balance Balance Balance Assessed: Yes Static Sitting Balance Static Sitting - Balance Support: Feet supported;Bilateral upper extremity supported Static Sitting - Level of Assistance: 5: Stand by assistance (supervision) Dynamic Sitting Balance Dynamic Sitting - Balance Support: Feet supported;No upper extremity supported Dynamic Sitting - Level of Assistance: 5: Stand by assistance (supervision) Static Standing Balance Static Standing - Balance Support: Bilateral upper extremity supported;During functional activity;No upper extremity supported Static Standing - Level of Assistance: 3: Mod assist Dynamic Standing Balance Dynamic Standing - Balance Support: Bilateral upper extremity supported;During functional activity (RW) Dynamic Standing -  Level of Assistance: 4: Min assist Dynamic Standing - Comments: with transfers and gait Extremity Assessment  RLE Assessment RLE Assessment: Exceptions to [facility] General Strength Comments: tested sitting EOB RLE Strength Right Hip Flexion: 3-/5 Right Hip ABduction: 3+/5 Right Hip ADduction: 3+/5 Right Knee Flexion: 3+/5 Right Knee Extension: 3+/5 Right Ankle Dorsiflexion: 4-/5 Right Ankle Plantar Flexion: 4-/5 LLE Assessment LLE Assessment: Exceptions to [facility] General Strength Comments: tested sitting EOB LLE Strength Left Hip Flexion: 3-/5 Left Hip ABduction: 3+/5 Left  Hip ADduction: 3+/5 Left Knee Flexion: 3+/5 Left Knee Extension: 3+/5 Left Ankle Dorsiflexion: 4-/5 Left Ankle Plantar Flexion: 4-/5  Care Tool Care Tool Bed Mobility Roll left and right activity   Roll left and right assist level: Minimal Assistance - Patient > 75% (flat bed, no bedrails)    Sit to lying activity        Lying to sitting on side of bed activity   Lying to sitting on side of bed assist level: the ability to move from lying on the back to sitting on the side of the bed with no back support.: Moderate Assistance - Patient 50 - 74% (flat bed, no bedrails)     Care Tool Transfers Sit to stand transfer   Sit to stand assist level: Maximal Assistance - Patient 25 - 49% (low sitting WC)    Chair/bed transfer   Chair/bed transfer assist level: Minimal Assistance - Patient > 75%    Car transfer   Car transfer assist level: Moderate Assistance - Patient 50 - 74%      Care Tool Locomotion Ambulation   Assist level: Minimal Assistance - Patient > 75% Assistive device: Walker-rolling Max distance: 94ft  Walk 10 feet activity   Assist level: Minimal Assistance - Patient > 75% Assistive device: Walker-rolling   Walk 50 feet with 2 turns activity Walk 50 feet with 2 turns activity did not occur: Safety/medical concerns (fatigue)      Walk 150 feet activity Walk 150 feet activity did not occur:  Safety/medical concerns (fatigue)      Walk 10 feet on uneven surfaces activity Walk 10 feet on uneven surfaces activity did not occur: Safety/medical concerns (fatigue)      Stairs Stair activity did not occur: Safety/medical concerns (fatigue)        Walk up/down 1 step activity Walk up/down 1 step or curb (drop down) activity did not occur: Safety/medical concerns (fatigue)      Walk up/down 4 steps activity Walk up/down 4 steps activity did not occur: Safety/medical concerns (fatigue)      Walk up/down 12 steps activity Walk up/down 12 steps activity did not occur: Safety/medical concerns (fatigue)      Pick up small objects from floor Pick up small object from the floor (from standing position) activity did not occur: Safety/medical concerns (fatigue)      Wheelchair Is the patient using a wheelchair?: Yes Type of Wheelchair: Manual   Wheelchair assist level: Dependent - Patient 0% Max wheelchair distance: 90ft  Wheel 50 feet with 2 turns activity   Assist Level: Dependent - Patient 0%  Wheel 150 feet activity   Assist Level: Dependent - Patient 0%    Refer to Care Plan for Long Term Goals  SHORT TERM GOAL WEEK 1 PT Short Term Goal 1 (Week 1): pt will transfer supine<>sitting EOB with CGA PT Short Term Goal 2 (Week 1): pt will transfer sit<>stand with LRAD and min A consistantly PT Short Term Goal 3 (Week 1): pt will ambulate 47ft with LRAD and CGA  Recommendations for other services: None   Skilled Therapeutic Intervention Evaluation completed (see details above and below) with education on PT POC and goals and individual treatment initiated with focus on functional mobility/transfers, generalized strengthening and endurance, dynamic standing balance/coordination, simulated car transfers, and ambulation. Received pt semi-reclined in bed, pt educated on PT evaluation, CIR policies, and therapy schedule and agreeable. Pt denied any pain during session.   Provided pt  with 20x16 manual WC and  bilateral elevating legrests while MD present for morning rounds. Donned knee high teds and non-skid socks in supine with max A for edema management and CSW arrived. Pt transferred supine<>sitting L EOB from flat bed without bedrails and light mod A for trunk control and to maintain sternal precautions (pt unwilling to attempt transfer without AD). Pt required mod A to scoot hips to EOB and donned second gown. Required a few attempts and mod A to stand from elevated EOB without RW while maintaining sternal precautions. Returned to sitting EOB due to weakness in BLE, then stood with RW and mod A and performed stand<>pivot into WC with RW and min A.   Pt transported to/from room in [facility] dependently for time management purposes. Pt required multiple attempts and max A to stand from low sitting WC and transferred to simulated car via stand<>pivot with RW and min A. Pt required min A to get BLEs into car but able to get out of car without assist. Pt stood from car with RW and mod A and ambulated 62ft with RW and min A to fatigue - cues for eccentric control when sitting. Returned to room and concluded session with pt sitting in WC, needs within reach, and chair pad alarm on. Safety plan updated.   Mobility Bed Mobility Bed Mobility: Rolling Left;Supine to Sit Rolling Left: Minimal Assistance - Patient > 75% Supine to Sit: Moderate Assistance - Patient 50-74% Transfers Transfers: Sit to Stand;Stand to Sit;Stand Pivot Transfers Sit to Stand: Maximal Assistance - Patient 25-49% Stand to Sit: Moderate Assistance - Patient 50-74% Stand Pivot Transfers: Minimal Assistance - Patient > 75% Stand Pivot Transfer Details: Verbal cues for technique Stand Pivot Transfer Details (indicate cue type and reason): verbal cues to reach back prior to sitting Transfer (Assistive device): Rolling walker Locomotion  Gait Ambulation: Yes Gait Assistance: Minimal Assistance - Patient > 75% Gait  Distance (Feet): 15 Feet Assistive device: Rolling walker Gait Gait: Yes Gait Pattern: Impaired Gait Pattern: Step-through pattern;Decreased step length - right;Decreased step length - left;Decreased stride length;Poor foot clearance - right;Poor foot clearance - left;Narrow base of support Gait velocity: decreased Stairs / Additional Locomotion Stairs: No Wheelchair Mobility Wheelchair Mobility: Yes Wheelchair Assistance: Dependent - Patient 0% Wheelchair Parts Management: Needs assistance Distance: 37ft   Discharge Criteria: Patient will be discharged from PT if patient refuses treatment 3 consecutive times without medical reason, if treatment goals not met, if there is a change in medical status, if patient makes no progress towards goals or if patient is discharged from hospital.  The above assessment, treatment plan, treatment alternatives and goals were discussed and mutually agreed upon: by patient  Huntley Dec PT, DPT 01/13/2024, 10:46 AM

## 2024-01-13 NOTE — Progress Notes (Signed)
PROGRESS NOTE   Subjective/Complaints: Hypotensive and fatigued, will add 0.5mg  fludricortisone daily, she is already on 20mg  midrodrine, will start D3 supplement Labs from yesterday reviewed and creatinine improved  ROS: +fatigue   Objective:   No results found. Recent Labs    01/12/24 0605  WBC 9.6  HGB 11.8*  HCT 37.4  PLT 182   Recent Labs    01/10/24 1352 01/12/24 0719  NA 133* 134*  K 4.3 4.4  CL 91* 94*  CO2 22 24  GLUCOSE 113* 154*  BUN 59* 34*  CREATININE 7.12* 5.16*  CALCIUM 9.6 9.2    Intake/Output Summary (Last 24 hours) at 01/13/2024 4098 Last data filed at 01/13/2024 0756 Gross per 24 hour  Intake 358 ml  Output --  Net 358 ml     Pressure Injury Buttocks Left Deep Tissue Pressure Injury - Purple or maroon localized area of discolored intact skin or blood-filled blister due to damage of underlying soft tissue from pressure and/or shear. Purple DTI 2cm round (Active)     Location: Buttocks  Location Orientation: Left  Staging: Deep Tissue Pressure Injury - Purple or maroon localized area of discolored intact skin or blood-filled blister due to damage of underlying soft tissue from pressure and/or shear.  Wound Description (Comments): Purple DTI 2cm round  Present on Admission: No    Physical Exam: Vital Signs Blood pressure 99/65, pulse 93, temperature (!) 97.3 F (36.3 C), resp. rate 17, height 5\' 3"  (1.6 m), weight 64.5 kg, SpO2 96%. Gen: no distress, normal appearing HEENT: oral mucosa pink and moist, NCAT Cardio: Reg rate Chest: normal effort, normal rate of breathing Abd: soft, non-distended Ext: no edema Psych: pleasant, flat affect Skin: intact Neuro:  Flat affect. Depressed appearing and frustrated about HD today. Speech clear and able to follow motor commands without difficulty. No focal deficits   Assessment/Plan: 1. Functional deficits which require 3+ hours per day of  interdisciplinary therapy in a comprehensive inpatient rehab setting. Physiatrist is providing close team supervision and 24 hour management of active medical problems listed below. Physiatrist and rehab team continue to assess barriers to discharge/monitor patient progress toward functional and medical goals  Care Tool:  Bathing              Bathing assist       Upper Body Dressing/Undressing Upper body dressing        Upper body assist      Lower Body Dressing/Undressing Lower body dressing            Lower body assist       Toileting Toileting    Toileting assist       Transfers Chair/bed transfer  Transfers assist           Locomotion Ambulation   Ambulation assist              Walk 10 feet activity   Assist           Walk 50 feet activity   Assist           Walk 150 feet activity   Assist  Walk 10 feet on uneven surface  activity   Assist           Wheelchair     Assist               Wheelchair 50 feet with 2 turns activity    Assist            Wheelchair 150 feet activity     Assist          Blood pressure 99/65, pulse 93, temperature (!) 97.3 F (36.3 C), resp. rate 17, height 5\' 3"  (1.6 m), weight 64.5 kg, SpO2 96%.  Medical Problem List and Plan: 1. Functional deficits secondary cardiab debility complicated by AKI             -patient may shower             -ELOS/Goals: 8-11 days S             Admit to CIR 2.  Antithrombotics: -DVT/anticoagulation:  Pharmaceutical: Coumadin             -antiplatelet therapy:  3. Pain Management: denies pain. Tylenol prn mild pain and oxycodone prn severe pain. 4. Mood/Behavior/Sleep: Team to provide ego support. LCSW to follow for evaluation and support.              --ON Seroquel and low dose Remeron at nights. Melatonin prn added.              -antipsychotic agents: Seroquel daily at bedtime. 5. Neuropsych/cognition:  This patient is capable of making decisions on her own behalf. 6. Skin/Wound Care: routine pressure relief measures. Monitor wound for healing 7. Fluids/Electrolytes/Nutrition:  Monitor I/O. Not on FR at this time             --Renal restrictions added to diet. Changed ensure to nephro             --continue vitamin C and protein supplement 8. Afib/A flutter: Monitor HR TID and for symptoms with activity.  --Continue Amiodarone 200 mg daily with coumadin. 9. Hypotension: TEDs. Midodrine 20 mg TID. KHT for support. Add fludricortisone 0.5mg  daily   10. Acute on CKD: Now on HD MWF due to ischemic ATN/shock. Schedule HD at the end of the day to help with tolerance of therapy             --continue Phoslo for now--should improve with renal restrictions.   Continue fluid restriction   11. Acute on chronic anemia: continue Aranesp weekly    12. HFrEF: Strict I/O. Daily weights. Fluid status being managed with HD.   22. Depression: continue Zoloft, Seroquel and Remeron. Provide ego support.    23. Constipation: Has finally had BM after enema/miralax. D/c enema  24. Fatigue: add fludricortisone 0.5mg  daily     LOS: 1 days A FACE TO FACE EVALUATION WAS PERFORMED  Horton Chin 01/13/2024, 9:37 AM

## 2024-01-13 NOTE — Plan of Care (Signed)
Wound Plan    Wounds present:  Pressure Injury Buttocks Left Deep Tissue Pressure Injury - Purple or maroon localized area of discolored intact skin or blood-filled blister due to damage of underlying soft tissue from pressure and/or shear. Purple DTI 2cm round now progressed to Stage 2; as of 01/12/24  Interventions:   Universal skin care order activation Sacral Foam application Foam application to wound Gerhardt's butt cream BID/prn to peri-area Vitamin C daily Vitamin D daily Renal Vit daily Renal diet/1500 cc FR; eating only 25-50% of meals Nepro supplement BID tween meals Magic Cup TID with meals  Braden Score: 18 Sensory: 4 Moisture: 4 Activity: 3 Mobility: 3 Nutrition: 3 Friction: 1  Contributors: Drusilla Kanner, RDN, LDN Clinical Nutrition Chana Bode, RN-BC, CRRN

## 2024-01-13 NOTE — Progress Notes (Signed)
Inpatient Rehabilitation Admission Medication Review by a Pharmacist  A complete drug regimen review was completed for this patient to identify any potential clinically significant medication issues.  High Risk Drug Classes Is patient taking? Indication by Medication  Antipsychotic Yes, IV ; Compazine - nausea  Anticoagulant Yes Warfarin - Afib, bMVR  Antibiotic No   Opioid Yes Oxycodone - pain   Antiplatelet No   Hypoglycemics/insulin Yes   Vasoactive Medication Yes Amiodarone- PAF/flutter  Chemotherapy No   Other Yes Acetaminophen - pain Aranesp - anemia of  ESRD Florinef, midodrine- hypotension Lanthanum- ESRD/ hyperphosphatemia Melatonin- sleep Multivitamin, Vitamin C, vitamin D- supplements Quetiapijne, Sertraline,  Mirtazapine- depression Senokot-S, miralax, sorbitol - for constipation     Type of Medication Issue Identified Description of Issue Recommendation(s)  Drug Interaction(s) (clinically significant)     Duplicate Therapy     Allergy     No Medication Administration End Date     Incorrect Dose     Additional Drug Therapy Needed     Significant med changes from prior encounter (inform family/care partners about these prior to discharge).  PTA meds: Aspirin, coreg, torsemide, vericiguat were discontinued on Acute encounter discharge AVS.  PTA meds : Jardiance, ibandronate Xopenex ordered to continue on acute care encounter discharge summary, but  not resumed yet.   Restart PTA meds when and if necessary during CIR admission or at time of discharge, if warranted   Other       Clinically significant medication issues were identified that warrant physician communication and completion of prescribed/recommended actions by midnight of the next day:  No  Name of provider notified for urgent issues identified:   Provider Method of Notification:    Pharmacist comments:   Time spent performing this drug regimen review (minutes):  45   Vanessa [name],  [place] Clinical Pharmacist 01/13/2024 2:37 PM

## 2024-01-13 NOTE — Progress Notes (Signed)
PHARMACY - ANTICOAGULATION CONSULT NOTE  Pharmacy Consult for Warfarin Indication: atrial fibrillation  Allergies  Allergen Reactions   Spironolactone     hyperkalemia    Patient Measurements: Height: 5\' 3"  (160 cm) Weight: 64.5 kg (142 lb 4.8 oz) IBW/kg (Calculated) : 52.4 Heparin Dosing Weight: ~ 70 kg  Vital Signs: Temp: 97.3 F (36.3 C) (02/13 0538) BP: 99/65 (02/13 0538) Pulse Rate: 93 (02/13 0538)  Labs: Recent Labs    01/11/24 0505 01/12/24 0605 01/12/24 0719 01/13/24 0355  HGB  --  11.8*  --   --   HCT  --  37.4  --   --   PLT  --  182  --   --   LABPROT 26.8* 28.4*  --  27.9*  INR 2.5* 2.6*  --  2.6*  CREATININE  --   --  5.16*  --    Estimated Creatinine Clearance: 8.9 mL/min (A) (by C-G formula based on SCr of 5.16 mg/dL (H)).  Assessment: 73 yo female s/p scheduled bioprosthetic mitral valve replacement with tissue valve 12/30 now with afib, pharmacy asked to dose anticoagulation with IV heparin. Spontaneously converted 1/7; flipped back to AF overnight 1/9 0300. Baseline INR 1.1, slow uptrend to 1.3 s/p 6 days warfarin therapy, total dose 20 mg, then held 1/21-24 for tunneled access plans. Patient now s/p [facility] and tunneled central line placement 1/24 AM on CRRT, plan HD MWF.  Off norepinephrine since 1/26, remains on midodrine. Resumed anticoagulation per VIR after tunneled cath placement. Heparin stopped 1/31 with INR >2.  11/11/24 UPDATE:  INR  = 2.6 , remains stable and therapeutic.  Average dose for the past 7d is around 3. CBC stable with Hgb 11.8, Hct 37 and pltc 182K on 2/12. No bleeding reported. Continue current dose and reduce INR checks to MWF.   Goal of Therapy:  INR goal 2 - 3 (bMVR) Monitor platelets by anticoagulation protocol: Yes   Plan:  Coumadin 3mg  qday Monitor INR every MWF    Thank you for allowing pharmacy to be part of this patients care team.  Noah Delaine, RPh Clinical Pharmacist 01/13/2024 2:40 PM  Please check AMION for  all [facility] Pharmacy phone numbers After 10:00 PM, call Main Pharmacy [phone]

## 2024-01-13 NOTE — Progress Notes (Signed)
Inpatient Rehabilitation  Patient information reviewed and entered into eRehab system by [facility]. Karen Kays., CCC/SLP, PPS Coordinator.  Information including medical coding, functional ability and quality indicators will be reviewed and updated through discharge.

## 2024-01-14 DIAGNOSIS — R5381 Other malaise: Secondary | ICD-10-CM | POA: Diagnosis not present

## 2024-01-14 LAB — PROTIME-INR
INR: 3 — ABNORMAL HIGH (ref 0.8–1.2)
Prothrombin Time: 31 s — ABNORMAL HIGH (ref 11.4–15.2)

## 2024-01-14 LAB — GLUCOSE, CAPILLARY
Glucose-Capillary: 99 mg/dL (ref 70–99)
Glucose-Capillary: 118 mg/dL — ABNORMAL HIGH (ref 70–99)
Glucose-Capillary: 114 mg/dL — ABNORMAL HIGH (ref 70–99)

## 2024-01-14 LAB — HEPATITIS B SURFACE ANTIBODY, QUANTITATIVE: Hep B S AB Quant (Post): <3.5 m[IU]/mL — ABNORMAL LOW

## 2024-01-14 MED ORDER — DOCUSATE SODIUM 100 MG PO CAPS
100.0000 mg | ORAL_CAPSULE | Freq: Every day | ORAL | Status: DC
  Administered 2024-01-14 – 2024-01-20 (×7): 100 mg via ORAL
  Filled 2024-01-14 (×8): qty 1

## 2024-01-14 MED ORDER — WARFARIN SODIUM 2 MG PO TABS
2.0000 mg | ORAL_TABLET | Freq: Once | ORAL | Status: AC
  Administered 2024-01-14: 2 mg via ORAL
  Filled 2024-01-14: qty 1

## 2024-01-14 NOTE — Progress Notes (Signed)
Physical Therapy Session Note  Patient Details  Name: Vanessa [name] MRN: [id number] Date of Birth: [birth year]  Today's Date: 01/14/2024 PT Individual Time: 0800-0915 PT Individual Time Calculation (min): 75 min   Short Term Goals: Week 1:  PT Short Term Goal 1 (Week 1): pt will transfer supine<>sitting EOB with CGA PT Short Term Goal 2 (Week 1): pt will transfer sit<>stand with LRAD and min A consistantly PT Short Term Goal 3 (Week 1): pt will ambulate 62ft with LRAD and CGA  Skilled Therapeutic Interventions/Progress Updates: Pt presents semi-reclined in bed and agreeable to therapy, pt w/ flat affect initially.  Pt HOB lowered and then performed sup to sit w/ mod A w/ rolling to Left and then assist to bring LES to EOB.  Pt required mod A for scooting to EOB, cues for weight shift.  Pt sat EOB for meds given by nursing.  Ptperformed calf raises, 1 set of 10 LAQs.  Pt performed sit to stand w/ max A and cues for breathing technique and rocking once bed elevated.  Pt uses pillow to maintain sternal precautions.  Pt amb x 5' w/ RW and min A to w/c.  Pt wheeled to main gym for energy conservation.  Pt performed seated LAQ, hip flexion and abd/add 3 x 10.  Pt transferred sit to stand w/ max A after assist to scoot to front of seat.  Pt cued for foot placement, forward lean and rocking, but pt still requires max A 2/2 pushing into extension.  Pt amb x 36' w/ RW and min A x 2 w/ seated est breaks between trials, O2 at 99-100% and HR to 100.  Pt returned to room and transferred w/ max A and amb to recliner w/ min A.  Pt remained in recliner w/ LES elevated, chair alarm on and all needs in reach.     Therapy Documentation Precautions:  Precautions Precautions: Fall, Sternal Restrictions Weight Bearing Restrictions Per Provider Order: No LUE Weight Bearing Per Provider Order: Non weight bearing Other Position/Activity Restrictions: sternal precautions General:   Vital Signs:   Pain:0/10 Pain  Assessment Pain Scale: 0-10 Pain Score: 0-No pain     Therapy/Group: Individual Therapy  Lucio Edward 01/14/2024, 9:21 AM

## 2024-01-14 NOTE — Plan of Care (Signed)
  Problem: Consults Goal: RH GENERAL PATIENT EDUCATION Description: See Patient Education module for education specifics. Outcome: Progressing   Problem: RH BOWEL ELIMINATION Goal: RH STG MANAGE BOWEL WITH ASSISTANCE Description: STG Manage Bowel with toileting Assistance. Outcome: Progressing Goal: RH STG MANAGE BOWEL W/MEDICATION W/ASSISTANCE Description: STG Manage Bowel with Medication with mod I Assistance. Outcome: Progressing   Problem: RH SKIN INTEGRITY Goal: RH STG SKIN FREE OF INFECTION/BREAKDOWN Description: Manage skin w min assist Outcome: Progressing Goal: RH STG MAINTAIN SKIN INTEGRITY WITH ASSISTANCE Description: STG Maintain Skin Integrity With min  Assistance. Outcome: Progressing Goal: RH STG ABLE TO PERFORM INCISION/WOUND CARE W/ASSISTANCE Description: STG Able To Perform Incision/Wound Care With min Assistance. Outcome: Progressing   Problem: RH SAFETY Goal: RH STG ADHERE TO SAFETY PRECAUTIONS W/ASSISTANCE/DEVICE Description: STG Adhere to Safety Precautions With cues Assistance/Device. Outcome: Progressing   Problem: RH KNOWLEDGE DEFICIT GENERAL Goal: RH STG INCREASE KNOWLEDGE OF SELF CARE AFTER HOSPITALIZATION Description: Patient and family will be able to manage care at discharge using educational resources for medication and dietary modification independently Outcome: Progressing

## 2024-01-14 NOTE — Progress Notes (Signed)
Report given to Dialysis nurse.

## 2024-01-14 NOTE — Progress Notes (Signed)
PHARMACY - ANTICOAGULATION CONSULT NOTE  Pharmacy Consult for Warfarin Indication: atrial fibrillation  Allergies  Allergen Reactions   Spironolactone     hyperkalemia    Patient Measurements: Height: 5\' 3"  (160 cm) Weight: 67.5 kg (148 lb 13 oz) IBW/kg (Calculated) : 52.4 Heparin Dosing Weight: ~ 70 kg  Vital Signs: Temp: 98.2 F (36.8 C) (02/14 1434) Temp Source: Oral (02/14 1434) BP: 110/76 (02/14 1434) Pulse Rate: 85 (02/14 1434)  Labs: Recent Labs    01/12/24 0605 01/12/24 0719 01/13/24 0355 01/14/24 0341  HGB 11.8*  --   --   --   HCT 37.4  --   --   --   PLT 182  --   --   --   LABPROT 28.4*  --  27.9* 31.0*  INR 2.6*  --  2.6* 3.0*  CREATININE  --  5.16*  --   --    Estimated Creatinine Clearance: 9.1 mL/min (A) (by C-G formula based on SCr of 5.16 mg/dL (H)).  Assessment: 73 yo female s/p scheduled bioprosthetic mitral valve replacement with tissue valve 12/30 now with afib, pharmacy asked to dose anticoagulation with IV heparin. Spontaneously converted 1/7; flipped back to AF overnight 1/9 0300. Baseline INR 1.1, slow uptrend to 1.3 s/p 6 days warfarin therapy, total dose 20 mg, then held 1/21-24 for tunneled access plans. Patient now s/p [facility] and tunneled central line placement 1/24 AM on CRRT, plan HD MWF.  Off norepinephrine since 1/26, remains on midodrine. Resumed anticoagulation per VIR after tunneled cath placement. Heparin stopped 1/31 with INR >2.  11/12/24 UPDATE:  INR  = 3.0,  therapeutic.  CBC stable on 01/12/24 with Hgb 11.8, Hct 37 and pltc 182K on 2/12. No bleeding reported. INR therapeutic but has increased to top of goal on 3mg  daily. Amiodarone may increase warfarin effect. Although he has been on amiodarone since [facility] acute care admit date 11/29/23.   Will give lower dose of 2mg  today and  check INR in AM.   Goal of Therapy:  INR goal 2 - 3 (bMVR) Monitor platelets by anticoagulation protocol: Yes   Plan:  Coumadin 2mg  today x1 Daily INR     Thank you for allowing pharmacy to be part of this patients care team.  Noah Delaine, RPh Clinical Pharmacist 01/14/2024 4:12 PM  Please check AMION for all [facility] Pharmacy phone numbers After 10:00 PM, call Main Pharmacy [phone]

## 2024-01-14 NOTE — IPOC Note (Signed)
Overall Plan of Care [facility]) Patient Details Name: Vanessa [name] MRN: [id number] DOB: [birth year]  Admitting Diagnosis: Debility  Hospital Problems: Principal Problem:   Debility     Functional Problem List: Nursing Bowel, Safety, Endurance, Medication Management, Skin Integrity  PT Balance, Edema, Endurance, Nutrition, Skin Integrity  OT Balance, Endurance, Motor  SLP    TR         Basic ADL's: OT Bathing, Dressing, Toileting     Advanced  ADL's: OT       Transfers: PT Bed Mobility, Bed to Chair, Car, Oncologist: PT Ambulation, Psychologist, prison and probation services, Stairs     Additional Impairments: OT None  SLP        TR      Anticipated Outcomes Item Anticipated Outcome  Self Feeding no goal, pt is independent  Swallowing      Basic self-care  supervision  Toileting  supervision   Bathroom Transfers supervision  Bowel/Bladder  manage bowel w mod I assist  Transfers  supervision with LRAD  Locomotion  supervision with LRAD  Communication     Cognition     Pain  n/a  Safety/Judgment  manage safety w cues   Therapy Plan: PT Intensity: Minimum of 1-2 x/day ,45 to 90 minutes PT Frequency: 5 out of 7 days PT Duration Estimated Length of Stay: 10-12 days OT Intensity: Minimum of 1-2 x/day, 45 to 90 minutes OT Frequency: 5 out of 7 days OT Duration/Estimated Length of Stay: 12-14 days     Team Interventions: Nursing Interventions Patient/Family Education, Medication Management, Discharge Planning, Bowel Management, Skin Care/Wound Management, Disease Management/Prevention  PT interventions Ambulation/gait training, Discharge planning, Functional mobility training, Psychosocial support, Therapeutic Activities, Balance/vestibular training, Disease management/prevention, Neuromuscular re-education, Skin care/wound management, Therapeutic Exercise, Wheelchair propulsion/positioning, DME/adaptive equipment instruction, Pain management,  Splinting/orthotics, UE/LE Strength taining/ROM, Firefighter, Equities trader education, Museum/gallery curator, UE/LE Coordination activities  OT Interventions Warden/ranger, DME/adaptive equipment instruction, Discharge planning, Functional mobility training, Psychosocial support, Patient/family education, Self Care/advanced ADL retraining, UE/LE Strength taining/ROM, Therapeutic Exercise, Therapeutic Activities  SLP Interventions    TR Interventions    SW/CM Interventions Discharge Planning, Psychosocial Support, Patient/Family Education   Barriers to Discharge MD  Medical stability  Nursing Decreased caregiver support, Hemodialysis, Home environment access/layout 1 level/level entry solo; sister and mom coming to her home to provide 24/7 care; HD (new) M-W-F  PT Hemodialysis, Weight, Weight bearing restrictions sternal precautions, HD M/W/F, body habitus, deconditioning/fatigue  OT      SLP      SW Insurance for SNF coverage, Weight bearing restrictions     Team Discharge Planning: Destination: PT-Home ,OT- Home , SLP-  Projected Follow-up: PT-Home health PT, OT-  Home health OT, SLP-  Projected Equipment Needs: PT-To be determined, OT- 3 in 1 bedside comode, SLP-  Equipment Details: PT-has none, OT-drop arm BSC Patient/family involved in discharge planning: PT- Patient,  OT-Patient, SLP-   MD ELOS: 8-11 days Medical Rehab Prognosis:  Excellent Assessment: The patient has been admitted for CIR therapies with the diagnosis of cardiac debility. The team will be addressing functional mobility, strength, stamina, balance, safety, adaptive techniques and equipment, self-care, bowel and bladder mgt, patient and caregiver education. Goals have been set at S. Anticipated discharge destination is home.         See Team Conference Notes for weekly updates to the plan of care

## 2024-01-14 NOTE — Progress Notes (Signed)
PROGRESS NOTE   Subjective/Complaints: Asks when sutures can be removed No other complaints  She says she did well with therapy this morning  ROS: +fatigue, denies nausea   Objective:   No results found. Recent Labs    01/12/24 0605  WBC 9.6  HGB 11.8*  HCT 37.4  PLT 182   Recent Labs    01/12/24 0719  NA 134*  K 4.4  CL 94*  CO2 24  GLUCOSE 154*  BUN 34*  CREATININE 5.16*  CALCIUM 9.2    Intake/Output Summary (Last 24 hours) at 01/14/2024 1610 Last data filed at 01/14/2024 0700 Gross per 24 hour  Intake 354 ml  Output 0 ml  Net 354 ml     Pressure Injury 01/12/24 Buttocks Left Deep Tissue Pressure Injury - Purple or maroon localized area of discolored intact skin or blood-filled blister due to damage of underlying soft tissue from pressure and/or shear. Purple DTI 2cm round (Active)  01/12/24 1830  Location: Buttocks  Location Orientation: Left  Staging: Deep Tissue Pressure Injury - Purple or maroon localized area of discolored intact skin or blood-filled blister due to damage of underlying soft tissue from pressure and/or shear.  Wound Description (Comments): Purple DTI 2cm round  Present on Admission: Yes    Physical Exam: Vital Signs Blood pressure 104/68, pulse 87, temperature 98.2 F (36.8 C), temperature source Oral, resp. rate 18, height 5\' 3"  (1.6 m), weight 67.5 kg, SpO2 94%. Gen: no distress, normal appearing HEENT: oral mucosa pink and moist, NCAT Cardio: Reg rate Chest: normal effort, normal rate of breathing Abd: soft, non-distended Ext: no edema Psych: pleasant, flat affect Skin: intact Neuro:  Brighter affect. Depressed appearing and frustrated about HD today. Speech clear and able to follow motor commands without difficulty. No focal deficits   Assessment/Plan: 1. Functional deficits which require 3+ hours per day of interdisciplinary therapy in a comprehensive inpatient rehab  setting. Physiatrist is providing close team supervision and 24 hour management of active medical problems listed below. Physiatrist and rehab team continue to assess barriers to discharge/monitor patient progress toward functional and medical goals  Care Tool:  Bathing    Body parts bathed by patient: Right arm, Left arm, Chest, Abdomen, Front perineal area, Right upper leg, Left upper leg, Face   Body parts bathed by helper: Left lower leg, Right lower leg, Buttocks     Bathing assist Assist Level: Moderate Assistance - Patient 50 - 74%     Upper Body Dressing/Undressing Upper body dressing   What is the patient wearing?: Hospital gown only    Upper body assist Assist Level: Minimal Assistance - Patient > 75%    Lower Body Dressing/Undressing Lower body dressing      What is the patient wearing?: Hospital gown only     Lower body assist Assist for lower body dressing: Moderate Assistance - Patient 50 - 74%     Toileting Toileting    Toileting assist Assist for toileting: Maximal Assistance - Patient 25 - 49%     Transfers Chair/bed transfer  Transfers assist     Chair/bed transfer assist level: Minimal Assistance - Patient > 75%  Locomotion Ambulation   Ambulation assist      Assist level: Minimal Assistance - Patient > 75% Assistive device: Walker-rolling Max distance: 36   Walk 10 feet activity   Assist     Assist level: Minimal Assistance - Patient > 75% Assistive device: Walker-rolling   Walk 50 feet activity   Assist Walk 50 feet with 2 turns activity did not occur: Safety/medical concerns (fatigue)         Walk 150 feet activity   Assist Walk 150 feet activity did not occur: Safety/medical concerns (fatigue)         Walk 10 feet on uneven surface  activity   Assist Walk 10 feet on uneven surfaces activity did not occur: Safety/medical concerns (fatigue)         Wheelchair     Assist Is the patient using a  wheelchair?: Yes Type of Wheelchair: Manual    Wheelchair assist level: Dependent - Patient 0% Max wheelchair distance: 66ft    Wheelchair 50 feet with 2 turns activity    Assist        Assist Level: Dependent - Patient 0%   Wheelchair 150 feet activity     Assist      Assist Level: Dependent - Patient 0%   Blood pressure 104/68, pulse 87, temperature 98.2 F (36.8 C), temperature source Oral, resp. rate 18, height 5\' 3"  (1.6 m), weight 67.5 kg, SpO2 94%.  Medical Problem List and Plan: 1. Functional deficits secondary cardiab debility complicated by AKI             -patient may shower             -ELOS/Goals: 8-11 days S             Admit to CIR 2.  Antithrombotics: -DVT/anticoagulation:  Pharmaceutical: Coumadin             -antiplatelet therapy:  3. Pain Management: denies pain. Tylenol prn mild pain and oxycodone prn severe pain. 4. Mood/Behavior/Sleep: Team to provide ego support. LCSW to follow for evaluation and support.              --ON Seroquel and low dose Remeron at nights. Melatonin prn added.              -antipsychotic agents: Seroquel daily at bedtime. 5. Neuropsych/cognition: This patient is capable of making decisions on her own behalf. 6. Skin/Wound Care: routine pressure relief measures. Monitor wound for healing 7. Fluids/Electrolytes/Nutrition:  Monitor I/O. Not on FR at this time             --Renal restrictions added to diet. Changed ensure to nephro             --continue vitamin C and protein supplement 8. Afib/A flutter: Monitor HR TID and for symptoms with activity.  --Continue Amiodarone 200 mg daily with coumadin. 9. Hypotension: TEDs. Midodrine 20 mg TID. KHT for support. Add fludricortisone 0.5mg  daily, continue, improved   10. Acute on CKD: Now on HD MWF due to ischemic ATN/shock. Schedule HD at the end of the day to help with tolerance of therapy             --continue Phoslo for now--should improve with renal restrictions.    Continue fluid restriction   11. Acute on chronic anemia: continue Aranesp weekly    12. HFrEF: Strict I/O. Daily weights. Fluid status being managed with HD.   22. Depression: continue Zoloft, Seroquel and Remeron. Provide ego support.  23. Constipation: Has finally had BM after enema/miralax. D/c enema. Add daily colace  24. Fatigue: add fludricortisone 0.5mg  daily, continue  25. Hyperglycemia: d/c feeding supplement     LOS: 2 days A FACE TO FACE EVALUATION WAS PERFORMED  Drema Pry Keri Tavella 01/14/2024, 9:52 AM

## 2024-01-14 NOTE — Progress Notes (Signed)
Vanessa [name]  Assessment/ Plan: Pt is a 73 y.o. yo female     1. Acute kidney Injury on chronic kidney disease stage IIIa: Appears to be primarily from ischemic ATN in the setting of shock (mixed vasoplegic/cardiogenic).  Dense acute kidney injury with dialysis dependency and no evidence of renal recovery yet.  Receiving dialysis MWF schedule, next HD today via TDC.  Pt has been accepted at [facility] on MWF 12:10 pm chair time. Pt will need to arrive at 11:15 am for paperwork on first day. Do not know discharge date from [facility] yet  2.  Severe mitral regurgitation/tricuspid regurgitation: Status post repair with postoperative cardiac tamponade.  Additional management per cardiothoracic surgery.  3.  Acute exacerbation of congestive heart failure with reduced ejection fraction: Volume status significantly improved with ongoing hemodialysis/ultrafiltration.  4.  Shock: From a combination of cardiogenic and vasoplegic etiology-currently on midodrine after previously requiring pressor/inotropic support.  5.  Hyperphosphatemia: Phosphorus level currently at goal for which we will continue phosphorus binder ( fosrenol) and continue low phosphorus/renal diet.  6. Anemia of CKD; Hb at goal.   Subjective: Seen and examined at bedside. No new issues-  no discharge date that I can see  Objective Vital signs in last 24 hours: Vitals:   01/13/24 1516 01/13/24 1947 01/14/24 0512 01/14/24 0513  BP: 105/76 120/84 104/68   Pulse: 89 88 87   Resp: 18 18 18    Temp: 97.7 F (36.5 C) 98.2 F (36.8 C) 98.2 F (36.8 C)   TempSrc:   Oral   SpO2: 100% 99% 94%   Weight:    67.5 kg  Height:       Weight change: 0 kg  Intake/Output Summary (Last 24 hours) at 01/14/2024 1239 Last data filed at 01/14/2024 1200 Gross per 24 hour  Intake 354 ml  Output 0 ml  Net 354 ml       Labs: RENAL PANEL Recent Labs  Lab 01/08/24 0640 01/09/24 0350 01/10/24 0500  01/10/24 1352 01/11/24 0505 01/12/24 0605 01/12/24 0719  NA 135 132*  --  133*  --   --  134*  K 4.1 4.3  --  4.3  --   --  4.4  CL 94* 92*  --  91*  --   --  94*  CO2 26 25  --  22  --   --  24  GLUCOSE 108* 110*  --  113*  --   --  154*  BUN 22 38*  --  59*  --   --  34*  CREATININE 3.36* 4.85*  --  7.12*  --   --  5.16*  CALCIUM 9.4 9.2  --  9.6  --   --  9.2  MG 2.3 2.3 2.5*  --  2.1 2.3  --   PHOS 3.6 4.0  --  5.3*  --   --  5.0*  ALBUMIN 3.0* 3.0*  --  3.0*  --   --  3.1*    Liver Function Tests: Recent Labs  Lab 01/09/24 0350 01/10/24 1352 01/12/24 0719  ALBUMIN 3.0* 3.0* 3.1*   No results for input(s): "LIPASE", "AMYLASE" in the last 168 hours. No results for input(s): "AMMONIA" in the last 168 hours. CBC: Recent Labs    01/07/24 0546 01/08/24 0640 01/09/24 0350 01/10/24 0500 01/12/24 0605  HGB 10.8* 11.8* 11.4* 11.1* 11.8*  MCV 98.5 100.3* 98.9 98.0 97.9    Cardiac Enzymes: No results for input(s): "  CKTOTAL", "CKMB", "CKMBINDEX", "TROPONINI" in the last 168 hours. CBG: Recent Labs  Lab 01/13/24 1147 01/13/24 1709 01/13/24 2101 01/14/24 0524 01/14/24 1128  GLUCAP 160* 145* 104* 99 118*    Iron Studies: No results for input(s): "IRON", "TIBC", "TRANSFERRIN", "FERRITIN" in the last 72 hours. Studies/Results: No results found.  Medications: Infusions:  anticoagulant sodium citrate      Scheduled Medications:  amiodarone  200 mg Oral Daily   ascorbic acid  250 mg Oral BID   Chlorhexidine Gluconate Cloth  6 each Topical Q12H   Chlorhexidine Gluconate Cloth  6 each Topical Q0600   cholecalciferol  1,000 Units Oral Daily   [START ON 01/18/2024] darbepoetin (ARANESP) injection - NON-DIALYSIS  100 mcg Subcutaneous Q Tue-1800   docusate sodium  100 mg Oral Daily   fludrocortisone  0.05 mg Oral Daily   Gerhardt's butt cream   Topical BID   lanthanum  500 mg Oral TID WC   midodrine  20 mg Oral TID WC   mirtazapine  7.5 mg Oral QHS   multivitamin  1  tablet Oral BID   polyethylene glycol  17 g Oral Daily   QUEtiapine  25 mg Oral QHS   senna-docusate  2 tablet Oral BID   sertraline  25 mg Oral Daily   sodium chloride flush  10-40 mL Intracatheter Q12H   warfarin  3 mg Oral q1600   Warfarin - Pharmacist Dosing Inpatient   Does not apply q1600    have reviewed scheduled and prn medications.  Physical Exam: General:NAD, comfortable Heart:RRR, s1s2 nl Lungs:clear b/l, no crackle Abdomen:soft, Non-tender, non-distended Extremities: Trace lower extremities edema present Dialysis Access: Right IJ TDC in place  Vanessa [name] 01/14/2024,12:39 PM  LOS: 2 days

## 2024-01-14 NOTE — Discharge Instructions (Addendum)
 Inpatient Rehab Discharge Instructions  Vanessa [name] Discharge date and time: 01/29/24   Activities/Precautions/ Functional Status: Activity: no lifting, driving, or strenuous exercise till cleared by MD. Continue sternal precautions till cleared by surgeon.  Diet: renal diet Limit fluids to 1500 cc per day.  Wound Care: keep wound clean and dry   Functional status:  ___ No restrictions      ___ Walk up steps independently ___ 24/7 supervision/assistance   ___ Walk up steps with assistance _X__ Intermittent supervision/assistance  ___ Bathe/dress independently ___ Walk with walker     ___ Bathe/dress with assistance ___ Walk Independently     ___ Shower independently ___ Walk with assistance     ___ Shower with assistance _X__ No alcohol      ___ Return to work/school ________  Special Instructions:      COMMUNITY REFERRALS UPON DISCHARGE:    Home Health:   PT   &    OT                Agency: [facility]   Phone:[phone]    Medical Equipment/Items Ordered:WHEELCHAIR AND 3 IN 1IF DOES NOT MAKE THE CO-PAY WILL NOT RECEIVE THE EQUIPMENT                                                 Agency/Supplier:[facility]  [phone]  My questions have been answered and I understand these instructions. I will adhere to these goals and the provided educational materials after my discharge from the hospital.  Patient/Caregiver Signature _______________________________ Date __________  Clinician Signature _______________________________________ Date __________  Please bring this form and your medication list with you to all your follow-up doctor's appointments.   ============================================================================  Information on my medicine - Coumadin   (Warfarin)  This medication education was reviewed with me or my healthcare representative as part of my discharge preparation.   Why was Coumadin prescribed for you? Coumadin was  prescribed for you because you have a blood clot or a medical condition that can cause an increased risk of forming blood clots. Blood clots can cause serious health problems by blocking the flow of blood to the heart, lung, or brain. Coumadin can prevent harmful blood clots from forming. As a reminder your indication for Coumadin is:  Stroke Prevention because of Atrial Fibrillation/bioprosthetic mitral valve replacement.   What test will check on my response to Coumadin? While on Coumadin (warfarin) you will need to have an INR test regularly to ensure that your dose is keeping you in the desired range. The INR (international normalized ratio) number is calculated from the result of the laboratory test called prothrombin time (PT).  If an INR APPOINTMENT HAS NOT ALREADY BEEN MADE FOR YOU please schedule an appointment to have this lab work done by your health care provider within 7 days. Your INR goal is usually a number between:  2 to 3 or your provider may give you a more narrow range like 2-2.5.  Ask your health care provider during an office visit what your goal INR is.  What  do you need to  know  About  COUMADIN? Take Coumadin (warfarin) exactly as prescribed by your healthcare provider about the same time each day.  DO NOT stop taking without talking to the doctor who prescribed the medication.  Stopping without other blood clot prevention  medication to take the place of Coumadin may increase your risk of developing a new clot or stroke.  Get refills before you run out.  What do you do if you miss a dose? If you miss a dose, take it as soon as you remember on the same day then continue your regularly scheduled regimen the next day.  Do not take two doses of Coumadin at the same time.  Important Safety Information A possible side effect of Coumadin (Warfarin) is an increased risk of bleeding. You should call your healthcare provider right away if you experience any of the following: Bleeding  from an injury or your nose that does not stop. Unusual colored urine (red or dark brown) or unusual colored stools (red or black). Unusual bruising for unknown reasons. A serious fall or if you hit your head (even if there is no bleeding).  Some foods or medicines interact with Coumadin (warfarin) and might alter your response to warfarin. To help avoid this: Eat a balanced diet, maintaining a consistent amount of Vitamin K. Notify your provider about major diet changes you plan to make. Avoid alcohol or limit your intake to 1 drink for women and 2 drinks for men per day. (1 drink is 5 oz. wine, 12 oz. beer, or 1.5 oz. liquor.)  Make sure that ANY health care provider who prescribes medication for you knows that you are taking Coumadin (warfarin).  Also make sure the healthcare provider who is monitoring your Coumadin knows when you have started a new medication including herbals and non-prescription products.  Coumadin (Warfarin)  Major Drug Interactions  Increased Warfarin Effect Decreased Warfarin Effect  Alcohol (large quantities) Antibiotics (esp. Septra/Bactrim, Flagyl, Cipro) Amiodarone (Cordarone) Aspirin (ASA) Cimetidine (Tagamet) Megestrol (Megace) NSAIDs (ibuprofen, naproxen, etc.) Piroxicam (Feldene) Propafenone (Rythmol SR) Propranolol (Inderal) Isoniazid (INH) Posaconazole (Noxafil) Barbiturates (Phenobarbital) Carbamazepine (Tegretol) Chlordiazepoxide (Librium) Cholestyramine (Questran) Griseofulvin Oral Contraceptives Rifampin Sucralfate (Carafate) Vitamin K   Coumadin (Warfarin) Major Herbal Interactions  Increased Warfarin Effect Decreased Warfarin Effect  Garlic Ginseng Ginkgo biloba Coenzyme Q10 Green tea St. John's wort    Coumadin (Warfarin) FOOD Interactions  Eat a consistent number of servings per week of foods HIGH in Vitamin K (1 serving =  cup)  Collards (cooked, or boiled & drained) Kale (cooked, or boiled & drained) Mustard greens  (cooked, or boiled & drained) Parsley *serving size only =  cup Spinach (cooked, or boiled & drained) Swiss chard (cooked, or boiled & drained) Turnip greens (cooked, or boiled & drained)  Eat a consistent number of servings per week of foods MEDIUM-HIGH in Vitamin K (1 serving = 1 cup)  Asparagus (cooked, or boiled & drained) Broccoli (cooked, boiled & drained, or raw & chopped) Brussel sprouts (cooked, or boiled & drained) *serving size only =  cup Lettuce, raw (green leaf, endive, romaine) Spinach, raw Turnip greens, raw & chopped   These websites have more information on Coumadin (warfarin):  http://www.king-russell.com/; https://www.hines.net/;

## 2024-01-14 NOTE — Progress Notes (Signed)
Occupational Therapy Session Note  Patient Details  Name: Vanessa [name] MRN: [id number] Date of Birth: [birth year]  Today's Date: 01/14/2024 OT Individual Time: 1105-1200 OT Individual Time Calculation (min): 55 min    Short Term Goals: Week 1:  OT Short Term Goal 1 (Week 1): Pt will demonstrate improved sit to stand strength to be able to rise to stand from low w/c seat with min A. OT Short Term Goal 2 (Week 1): Pt will be able to stand for at least 3 minutes to engage in LB bathing tasks for bottom and perineal area. OT Short Term Goal 3 (Week 1): Pt will be able to don pull on pants with min A. OT Short Term Goal 4 (Week 1): Pt will complete toilet transfer and ambulation to toilet with RW with CGA.  Skilled Therapeutic Interventions/Progress Updates:  Skilled OT intervention completed with focus on activity tolerance, anterior weight shifting in prep for standing, sternal precaution education, BUE strengthening. Pt received seated in w/c, agreeable to session. No pain reported however did endorse nausea with earlier vomiting. Nurse notified and issued meds at start of session.  Pt declined self-care needs. Transported dependently in w/c <> gym. Pt with difficulty scooting forward and back in w/c, requesting assist. Encouragement provided to try as independently as possible first, with emphasis on nose over toes method, loading her feet and attempting to lift her buttocks for clearance however pt with minimal clearance requiring mod A to scoot <> in w/c. Pt stated "I can't use my arms to help with much." Education provided on sternal precautions and importance/necessity of engaging the arms in functional mobility to assist her body with avoidance of heavy pushing/pulling and movement outside of the tube.  Attempted to stand x3, with cues provided rocking 1-3 method, hands on thighs for leverage vs crossed at chest, and anterior weight shifting, however pt continued to sit back on tail bone  and minimal effort powering up. Pt did endorse extreme fatigue and weakness from earlier session. Transitioned to anterior weight shifting activity with pt seated, placing/retrieving squigz from long mirror. Pt naturally used contralateral UE on thigh for support, and advised she could use this for prep to stand.  Seated in w/c, pt completed the following BUE exercises to promote BUE strength/endurance needed for independence with functional transfers and BADLs: (With 3 lb dumbbell) -15 bicep curls each arm -10 chest press AROM only with visual target needed for complete ROM  Back in room, pt remained seated in w/c, with chair alarm on/activated, and with all needs in reach at end of session.   Therapy Documentation Precautions:  Precautions Precautions: Fall, Sternal Restrictions Weight Bearing Restrictions Per Provider Order: No LUE Weight Bearing Per Provider Order: Non weight bearing Other Position/Activity Restrictions: sternal precautions    Therapy/Group: Individual Therapy  [name], MS, OTR/L  01/14/2024, 12:12 PM

## 2024-01-14 NOTE — Group Note (Signed)
Patient Details Name: Vanessa [name] MRN: [id number] DOB: 25-Jan-1951 Today's Date: 01/14/2024  Time Calculation: OT Group Time Calculation OT Group Start Time: 1005 OT Group Stop Time: 1105 OT Group Time Calculation (min): 60 min      Group Description: Stress management: Pt participated in group session with a focus on stress mgmt, education provided on healthy coping strategies, and social interaction. Focus of session on providing coping strategies to manage new diagnosis to allow for improved mental health to increase overall quality of life . Discussed how to break down stressors into "daily hassles," "major life stressors" and "life circumstances" in an effort to allow pts to chunk their stressors into groups and determine where to best put their efforts/time when dealing with stress. Provided active listening, emotional support and therapeutic use of self. Offered education on factors that protect [place] against stress such as "daily uplifts," "healthy coping strategies" and "protective factors." Encouraged all group members to make an effort to actively recall one event from their day that was a daily uplift in an effort to protect their mindset from stressors as well as sharing this information with their caregivers to facilitate improved caregiver communication and decrease overall burden of care.  Issued pt handouts on healthy coping strategies to implement into routine.   Individual level documentation: Patient participated with full collaboration during session.   Pain: No pain reported during sessio although pt did have emesis episode during session, emesis bag provided, pt reported no intervention needed.   [name] [facility] 01/14/2024, 11:21 AM

## 2024-01-14 NOTE — Progress Notes (Signed)
Called hemodialysis to inquire about pt scheduled day--informed this nurse that she is on the list to be dialyzed.  1902--Pt still waiting on dialysis

## 2024-01-15 ENCOUNTER — Inpatient Hospital Stay (HOSPITAL_COMMUNITY): Payer: PPO

## 2024-01-15 DIAGNOSIS — R5381 Other malaise: Secondary | ICD-10-CM | POA: Diagnosis not present

## 2024-01-15 LAB — CBC
HCT: 39.2 % (ref 36.0–46.0)
Hemoglobin: 12.4 g/dL (ref 12.0–15.0)
MCH: 30.4 pg (ref 26.0–34.0)
MCHC: 31.6 g/dL (ref 30.0–36.0)
MCV: 96.1 fL (ref 80.0–100.0)
Platelets: 152 10*3/uL (ref 150–400)
RBC: 4.08 MIL/uL (ref 3.87–5.11)
RDW: 16.8 % — ABNORMAL HIGH (ref 11.5–15.5)
WBC: 10.3 10*3/uL (ref 4.0–10.5)
nRBC: 0 % (ref 0.0–0.2)

## 2024-01-15 LAB — GLUCOSE, CAPILLARY
Glucose-Capillary: 123 mg/dL — ABNORMAL HIGH (ref 70–99)
Glucose-Capillary: 133 mg/dL — ABNORMAL HIGH (ref 70–99)
Glucose-Capillary: 164 mg/dL — ABNORMAL HIGH (ref 70–99)
Glucose-Capillary: 145 mg/dL — ABNORMAL HIGH (ref 70–99)

## 2024-01-15 LAB — PROTIME-INR
INR: 3.1 — ABNORMAL HIGH (ref 0.8–1.2)
Prothrombin Time: 32.3 s — ABNORMAL HIGH (ref 11.4–15.2)

## 2024-01-15 MED ORDER — WARFARIN SODIUM 2 MG PO TABS
2.0000 mg | ORAL_TABLET | Freq: Once | ORAL | Status: AC
  Administered 2024-01-15: 2 mg via ORAL
  Filled 2024-01-15: qty 1

## 2024-01-15 MED ORDER — PROCHLORPERAZINE MALEATE 5 MG PO TABS
5.0000 mg | ORAL_TABLET | Freq: Three times a day (TID) | ORAL | Status: DC | PRN
Start: 2024-01-15 — End: 2024-01-29

## 2024-01-15 MED ORDER — ALTEPLASE 2 MG IJ SOLR
2.0000 mg | Freq: Once | INTRAMUSCULAR | Status: AC
  Administered 2024-01-15: 2 mg
  Filled 2024-01-15: qty 2

## 2024-01-15 MED ORDER — ONDANSETRON HCL 4 MG PO TABS
8.0000 mg | ORAL_TABLET | Freq: Three times a day (TID) | ORAL | Status: DC | PRN
  Administered 2024-01-15 – 2024-01-28 (×5): 8 mg via ORAL
  Filled 2024-01-15 (×5): qty 2

## 2024-01-15 NOTE — Progress Notes (Signed)
No blood returns both CVC lumens. At times can get blood return small amount, then no blood returns. Instilled tPA on Red port. Able to remove 3 ml of blood, but then no blood returns. Possible mechanical obstruction. Consulted with PICC nurse regarding this matter. Suggested to do power flush then take chest x-ray for end of tip position. Informed patient's RN and she already talked doctor and will put in the order for chest x-ray. HS [employer]

## 2024-01-15 NOTE — Progress Notes (Signed)
Physical Therapy Session Note  Patient Details  Name: Vanessa [name] MRN: [id number] Date of Birth: June 19, 1951  Today's Date: 01/15/2024 PT Individual Time: 7829-5621; 1129 - 1207 PT Individual Time Calculation (min): 35 min; 38 min   Short Term Goals: Week 1:  PT Short Term Goal 1 (Week 1): pt will transfer supine<>sitting EOB with CGA PT Short Term Goal 2 (Week 1): pt will transfer sit<>stand with LRAD and min A consistantly PT Short Term Goal 3 (Week 1): pt will ambulate 100ft with LRAD and CGA  SESSION 1 Skilled Therapeutic Interventions/Progress Updates: Patient sitting in WC on entrance to room. Patient alert and agreeable to PT session.   Patient with no complaints of pain. Beginning of session focused on building pt rapport. Pt stated that therapist here have been saying she is progressing, and when asked if pt feels that is pt, pt agreed in some aspects such as ability to stand with decreased fear of legs buckling and ability to take steps. Pt would like to continue working on B LE strength and standing tolerance. Pt transported to main gym in [facility] dependently. Pt reported personal hobbies that involve word-finding. Pt participated in "wordscapes" app on mobile phone while standing to increase WB tolerance. Pt required modA + 2 to stand with VC to scoot anteriorly (increased time required to scoot). Pt also provided with multimodal cuing to shift weight anteriorly with "nose over toes." Pt with good recall of sternal precautions. Pt stood at table with one UE support and CGA/close supervision. Pt reported light-headedness and required sit back to [facility] with CGA and good eccentric control. Pt reported nausea and had emetic event. BP monitored as recorded below. Pt transported back to room to give time to rest prior to next therapy session.   Patient sitting in WC at end of session with brakes locked, and all needs within reach.  SESSION 2 Skilled Therapeutic Interventions/Progress Updates:  Patient sitting in [facility] with nsg staff attempting to drain CVC on L on entrance to room (missed time indicated - nsg requested pt to return to bed at noon to re-attempt drain as nsg was unable to perform with pt sitting in WC). Patient alert and agreeable to PT session.   Patient reported having lightheadedness when having BM shortly before start of 2nd session. Pt also with increase in fatigue and stated that she did not return back to room from dialysis until after 1 a.m, and was unable to sleep well.   Therapeutic Activity: Bed Mobility: Pt performed sit<supine from EOB with modA. VC required for sequence (to lay laterally on L shoulder and advance B LE's accordingly with modA). Pt cued to laterally scoot to center of bed in supine with instructions to perform bridge (supervision and increased time required).  Transfers: Pt performed sit<>stand pivot from WC<EOB with maxA to stand, and CGA/light minA to pivot in RW. Provided VC for increasing anterior weight shift. Pt required several attempts to stand (PTA on R side to assist with maxA - PTA eventually in front of pt to further assist heavy maxA to stand).   Therapeutic Exercise: Pt performed the following exercises with therapist providing the described cuing and facilitation for improvement. Pedals set to 1 and high elevation to increase quad/glute activation. Pt required extended rest breaks throughout due to fatigue.  - 4 min x 1; 1.5 min x 1 at 5 CPS - 3 min x 1 10 CPS  Patient supine in bed at end of session with brakes locked,  bed alarm set, and all needs within reach.       Therapy Documentation Precautions:  Precautions Precautions: Fall, Sternal Restrictions Weight Bearing Restrictions Per Provider Order: No LUE Weight Bearing Per Provider Order: Non weight bearing Other Position/Activity Restrictions: sternal precautions  Therapy/Group: Individual Therapy  Donny Heffern PTA 01/15/2024, 12:31 PM

## 2024-01-15 NOTE — Progress Notes (Signed)
PROGRESS NOTE   Subjective/Complaints: No events overnight.  Tolerated dialysis well.  Took off 1 L. Vitals stable this a.m. No labs with dialysis yesterday, blood sugars remain well-controlled.  CBC today stable. Eating only about 25% of her meals.  Minimal p.o. fluid intakes. Continent of bowel and bladder, last bowel movement 2-15, large  On exam, patient initially with no complaints, then suddenly becomes very nauseous and starts to vomit clear fluid.  States she has been having some increased nausea recently, not associated with movement, time of day, or p.o. intakes.  No headaches, vision, abdominal pain, diarrhea, constipation, or stomach pain associated.  States Compazine does not work.  Vitals stable, blood sugars 130s.  ROS:   ROS: Positives per HPI above. Denies fevers, chills,   abdominal pain, SOB, chest pain, new weakness or paraesthesias.   + Nausea/vomiting-worse today  Objective:   No results found. No results for input(s): "WBC", "HGB", "HCT", "PLT" in the last 72 hours.  No results for input(s): "NA", "K", "CL", "CO2", "GLUCOSE", "BUN", "CREATININE", "CALCIUM" in the last 72 hours.   Intake/Output Summary (Last 24 hours) at 01/15/2024 0857 Last data filed at 01/15/2024 1191 Gross per 24 hour  Intake 356 ml  Output 1000 ml  Net -644 ml     Pressure Injury 01/12/24 Buttocks Left Deep Tissue Pressure Injury - Purple or maroon localized area of discolored intact skin or blood-filled blister due to damage of underlying soft tissue from pressure and/or shear. Purple DTI 2cm round (Active)  01/12/24 1830  Location: Buttocks  Location Orientation: Left  Staging: Deep Tissue Pressure Injury - Purple or maroon localized area of discolored intact skin or blood-filled blister due to damage of underlying soft tissue from pressure and/or shear.  Wound Description (Comments): Purple DTI 2cm round  Present on Admission:  Yes    Physical Exam: Vital Signs Blood pressure 102/62, pulse 93, temperature 97.9 F (36.6 C), temperature source Oral, resp. rate 18, height 5\' 3"  (1.6 m), weight 68.2 kg, SpO2 99%. Gen: no distress, normal appearing.  Sitting upright in bedside chair.   HEENT: oral mucosa pink and moist, NCAT Cardio: Reg rate Chest: normal effort, normal rate of breathing Abd: soft, normal bowel sounds, non-distended but protuberant Ext: no edema Psych: pleasant, flat affect Skin: intact; right chest dialysis port C/D/I Neuro: No focal neurologic deficits.  Awake, alert and oriented x 4.  Moving all 4 extremities antigravity.   Assessment/Plan: 1. Functional deficits which require 3+ hours per day of interdisciplinary therapy in a comprehensive inpatient rehab setting. Physiatrist is providing close team supervision and 24 hour management of active medical problems listed below. Physiatrist and rehab team continue to assess barriers to discharge/monitor patient progress toward functional and medical goals  Care Tool:  Bathing    Body parts bathed by patient: Right arm, Left arm, Chest, Abdomen, Front perineal area, Right upper leg, Left upper leg, Face   Body parts bathed by helper: Left lower leg, Right lower leg, Buttocks     Bathing assist Assist Level: Moderate Assistance - Patient 50 - 74%     Upper Body Dressing/Undressing Upper body dressing   What is the patient wearing?:  Hospital gown only    Upper body assist Assist Level: Minimal Assistance - Patient > 75%    Lower Body Dressing/Undressing Lower body dressing      What is the patient wearing?: Hospital gown only     Lower body assist Assist for lower body dressing: Moderate Assistance - Patient 50 - 74%     Toileting Toileting    Toileting assist Assist for toileting: Maximal Assistance - Patient 25 - 49%     Transfers Chair/bed transfer  Transfers assist     Chair/bed transfer assist level: Minimal  Assistance - Patient > 75%     Locomotion Ambulation   Ambulation assist      Assist level: Minimal Assistance - Patient > 75% Assistive device: Walker-rolling Max distance: 36   Walk 10 feet activity   Assist     Assist level: Minimal Assistance - Patient > 75% Assistive device: Walker-rolling   Walk 50 feet activity   Assist Walk 50 feet with 2 turns activity did not occur: Safety/medical concerns (fatigue)         Walk 150 feet activity   Assist Walk 150 feet activity did not occur: Safety/medical concerns (fatigue)         Walk 10 feet on uneven surface  activity   Assist Walk 10 feet on uneven surfaces activity did not occur: Safety/medical concerns (fatigue)         Wheelchair     Assist Is the patient using a wheelchair?: Yes Type of Wheelchair: Manual    Wheelchair assist level: Dependent - Patient 0% Max wheelchair distance: 12ft    Wheelchair 50 feet with 2 turns activity    Assist        Assist Level: Dependent - Patient 0%   Wheelchair 150 feet activity     Assist      Assist Level: Dependent - Patient 0%   Blood pressure 102/62, pulse 93, temperature 97.9 F (36.6 C), temperature source Oral, resp. rate 18, height 5\' 3"  (1.6 m), weight 68.2 kg, SpO2 99%.  Medical Problem List and Plan: 1. Functional deficits secondary cardiab debility complicated by AKI             -patient may shower             -ELOS/Goals: 8-11 days S             -Stable to continue CIR  2.  Antithrombotics: -DVT/anticoagulation:  Pharmaceutical: Coumadin             -antiplatelet therapy:  3. Pain Management: denies pain. Tylenol prn mild pain and oxycodone prn severe pain. 4. Mood/Behavior/Sleep: Team to provide ego support. LCSW to follow for evaluation and support.              --ON Seroquel and low dose Remeron at nights. Melatonin prn added.              -antipsychotic agents: Seroquel daily at bedtime.  5.  Neuropsych/cognition: This patient is capable of making decisions on her own behalf. 6. Skin/Wound Care: routine pressure relief measures. Monitor wound for healing  -2-15: Greater than 2 weeks postop open cardiac surgery; order to remove abdominal sutures  7. Fluids/Electrolytes/Nutrition:  Monitor I/O. Not on FR at this time             --Renal restrictions added to diet. Changed ensure to nephro             --continue vitamin C and protein supplement  8. Afib/A flutter: Monitor HR TID and for symptoms with activity.  --Continue Amiodarone 200 mg daily with coumadin. -Heart rate well-controlled  9. Hypotension: TEDs. Midodrine 20 mg TID. KHT for support. Add fludricortisone 0.5mg  daily, continue, improved   -Blood pressure remains stable, mildly hypotensive  10. Acute on CKD: Now on HD MWF due to ischemic ATN/shock. Schedule HD at the end of the day to help with tolerance of therapy             --continue Phoslo for now--should improve with renal restrictions.   Continue fluid restriction  -2/15: No labs with dialysis yesterday; nephrology following   11. Acute on chronic anemia: continue Aranesp weekly    12. HFrEF: Strict I/O. Daily weights. Fluid status being managed with HD.   -Weights approximately stable around dialysis [facility] Weights   01/14/24 2110 01/15/24 0045 01/15/24 0606  Weight: 67.5 kg 66.5 kg 68.2 kg    22. Depression: continue Zoloft, Seroquel and Remeron. Provide ego support.    23. Constipation: Has finally had BM after enema/miralax. D/c enema. Add daily colace  -2-15 last bowel movement, large  24. Fatigue: add fludricortisone 0.5mg  daily, continue  25. Hyperglycemia: d/c feeding supplement  -Lead sugar well-controlled 26.  Nausea/vomiting.  -Change medications Zofran 8 mg every 8 hours as needed, with Compazine for breakthrough  LOS: 3 days A FACE TO FACE EVALUATION WAS PERFORMED  Angelina Sheriff 01/15/2024, 8:57 AM

## 2024-01-15 NOTE — Progress Notes (Signed)
PHARMACY - ANTICOAGULATION CONSULT NOTE  Pharmacy Consult for Warfarin Indication: atrial fibrillation  Allergies  Allergen Reactions   Spironolactone     hyperkalemia    Patient Measurements: Height: 5\' 3"  (160 cm) Weight: 68.2 kg (150 lb 5.7 oz) IBW/kg (Calculated) : 52.4 Heparin Dosing Weight: ~ 70 kg  Vital Signs: Temp: 97.9 F (36.6 C) (02/15 0403) Temp Source: Oral (02/15 0403) BP: 102/62 (02/15 0403) Pulse Rate: 93 (02/15 0403)  Labs: Recent Labs    01/13/24 0355 01/14/24 0341 01/15/24 1304  HGB  --   --  12.4  HCT  --   --  39.2  PLT  --   --  152  LABPROT 27.9* 31.0* 32.3*  INR 2.6* 3.0* 3.1*   Estimated Creatinine Clearance: 9.1 mL/min (A) (by C-G formula based on SCr of 5.16 mg/dL (H)).  Assessment: 73 yo female s/p scheduled bioprosthetic mitral valve replacement with tissue valve 12/30 now with afib, pharmacy asked to dose anticoagulation with IV heparin. Spontaneously converted 1/7; flipped back to AF overnight 1/9 0300. Baseline INR 1.1, slow uptrend to 1.3 s/p 6 days warfarin therapy, total dose 20 mg, then held 1/21-24 for tunneled access plans. Patient now s/p [facility] and tunneled central line placement 1/24 AM on CRRT, plan HD MWF.  Off norepinephrine since 1/26, remains on midodrine. Resumed anticoagulation per VIR after tunneled cath placement. Heparin stopped 1/31 with INR >2.  11/13/24 UPDATE:  INR  = 3.0 >3.1, slightly supratherapeutic.  CBC stable on 01/15/24 with Hgb 12.4 and pltc 152K on 2/15. No bleeding reported. Still seeing the effects of 3mg  of warfarin. Two mg given yesterday. Amiodarone may increase warfarin effect. Although he has been on amiodarone since [facility] acute care admit date 11/29/23.   Will give lower dose of 2mg  again today and  check INR in AM.   Goal of Therapy:  INR goal 2 - 3 (bMVR) Monitor platelets by anticoagulation protocol: Yes   Plan:  Coumadin 2mg  today x1 Daily INR    Zahriyah Joo A. Jeanella Craze, PharmD, BCPS, FNKF Clinical  Pharmacist [place] Please utilize Amion for appropriate phone number to reach the unit pharmacist [facility])

## 2024-01-15 NOTE — Progress Notes (Signed)
Occupational Therapy Session Note  Patient Details  Name: Vanessa [name] MRN: [id number] Date of Birth: 1951/11/24  Today's Date: 01/15/2024 OT Individual Time: 0805-0900 & 1400-1443 OT Individual Time Calculation (min): 55 min & 43 min   Short Term Goals: Week 1:  OT Short Term Goal 1 (Week 1): Pt will demonstrate improved sit to stand strength to be able to rise to stand from low w/c seat with min A. OT Short Term Goal 2 (Week 1): Pt will be able to stand for at least 3 minutes to engage in LB bathing tasks for bottom and perineal area. OT Short Term Goal 3 (Week 1): Pt will be able to don pull on pants with min A. OT Short Term Goal 4 (Week 1): Pt will complete toilet transfer and ambulation to toilet with RW with CGA.  Skilled Therapeutic Interventions/Progress Updates:  Session 1 Skilled OT intervention completed with focus on ADL retraining, functional transfers, activity tolerance. Pt received upright in bed with PICC nurse present, agreeable to session. No pain reported.  Pt agreeable to wash up and wear clothing this date, especially shorts to enable leverage with powering up to stand. Transitioned to EOB with supervision initially however pt with inefficient scoot to EOB requiring min A advance hips with active pt participation.   Pt able to sit EOB with distant supervision. Able to wash UB with supervision. Pt leaned back perpendicular in bed to wash front periarea, with no assist need to return sitting EOB without UE assist. Overall min A for LB for feet. Donned knee high TEDs and grip socks dependently. + time and difficulty noted with threading of LB clothing; would benefit from trial of reacher/sock aid in future. Min A needed to donn shorts over feet. Bed elevated to assist with powering up into standing, with cues needed to scoot forward for feet to contact floor with again + time needed for but no assist. Stood with hands on thighs with min A, then pt remained standing with  CGA using RW for OT to wash buttocks, apply buttock cream per request, then mod A to donn shorts over hips. Pt c/o SOB, however improved with diaphragmatic breathing cues. Min A ambulatory transfer with RW > w/c with cues for stepping back to chair with poor descent to w/c demonstrated.  Seated at sink, pt was set up A for oral care, facial grooming (no nicks or cuts noted). Pt remained seated in w/c, with chair alarm on/activated, and with all needs in reach at end of session.  Session 2 Skilled OT intervention completed with focus on sit > stands, functional transfers, DME education. Pt received upright in bed, agreeable to session. No pain reported.  Transitioned to EOB with supervision including scooting to EOB however + time required. Practiced standing from different heights from bed. From elevated bed, pt stood with light min A using hands on thighs. Seated rest break provided. Lowered the bed however not to lowest position/reasonable height. Pt with difficulty powering up, anterior weight shifting and loading BLE to stand. Practiced loading BLE with mini buttock lifts from bed though no clearance achieved. Then stood with heavy mod A with hands on thighs with cues to load her heels. Ambulated with CGA using RW > recliner.  Discussed common surfaces such as bed, toilet and favorite chair to consider for potential challenges for powering to stand. OT recommended BSC to put over toilet due to reportedly low seat that pt had difficulty with at baseline. Pt reports her bed  height being about the height of the 2nd trial with standing.   Issued pt juice per request and nurse clearance. Pt remained seated in recliner with direct care handoff to NT for toileting need, MD also present for rounds. All needs met at end of session.   Therapy Documentation Precautions:  Precautions Precautions: Fall, Sternal Restrictions Weight Bearing Restrictions Per Provider Order: No Other Position/Activity  Restrictions: sternal precautions    Therapy/Group: Individual Therapy  [name], MS, OTR/L  01/15/2024, 2:48 PM

## 2024-01-15 NOTE — Progress Notes (Signed)
Hemodialysis completed. Tolerated net fluid removal of 1000 ml. Blood pressure in a soft side during dialysis SBP less than 100 mmhg.  Alert and oriented. No complaints. Right [place] TDC de accessed. Flushed and locked with nss, clamped and replaced new caps securely. Patient in stable condition upon leaving dialysis unit. Handoff report given to [facility] Bell,LPN

## 2024-01-16 DIAGNOSIS — R5381 Other malaise: Secondary | ICD-10-CM | POA: Diagnosis not present

## 2024-01-16 LAB — GLUCOSE, CAPILLARY
Glucose-Capillary: 101 mg/dL — ABNORMAL HIGH (ref 70–99)
Glucose-Capillary: 108 mg/dL — ABNORMAL HIGH (ref 70–99)
Glucose-Capillary: 104 mg/dL — ABNORMAL HIGH (ref 70–99)
Glucose-Capillary: 144 mg/dL — ABNORMAL HIGH (ref 70–99)

## 2024-01-16 LAB — PROTIME-INR
INR: 3.1 — ABNORMAL HIGH (ref 0.8–1.2)
Prothrombin Time: 31.8 s — ABNORMAL HIGH (ref 11.4–15.2)

## 2024-01-16 MED ORDER — WARFARIN SODIUM 1 MG PO TABS
1.0000 mg | ORAL_TABLET | Freq: Once | ORAL | Status: AC
  Administered 2024-01-16: 1 mg via ORAL
  Filled 2024-01-16: qty 1

## 2024-01-16 NOTE — Progress Notes (Addendum)
PROGRESS NOTE   Subjective/Complaints:  No events overnight.  Vital stable.  Multiple bouts of emesis yesterday afternoon into the early evening, has since resolved.  No gross hematemesis or coffee grounds, just clear fluids.  P.o. intakes have been poor, 10 to 25% of meals.  On exam today, she is sitting up and eating lunch, has had no further vomiting or nausea today.  Feels completely resolved.  IV team having trouble getting drawback from PICC line, concern for obstruction by hemodialysis catheter, chest x-ray obtained this morning; per IV team no obstruction unable to get blood draw back from PICC this morning.   Did have a BM yesterday.   ROS:   ROS: Positives per HPI above. Denies fevers, chills,   abdominal pain, SOB, chest pain, new weakness or paraesthesias.   + Nausea/vomiting-intermittent  Objective:   DG Chest 1 View Result Date: 01/16/2024 CLINICAL DATA:  Evaluate for central line complication EXAM: PORTABLE CHEST 1 VIEW COMPARISON:  12/24/2023 FINDINGS: Dialysis catheter is again identified and stable. Left tunneled PICC is seen with the catheter tip at the cavoatrial junction. Postsurgical changes are noted. Elevation of left hemidiaphragm is seen with left basilar atelectasis. Right lung is clear. IMPRESSION: Left basilar atelectasis. Tubes and lines as described above stable from the prior exam. Electronically Signed   By: Alcide Clever M.D.   On: 01/16/2024 01:10   Recent Labs    01/15/24 1304  WBC 10.3  HGB 12.4  HCT 39.2  PLT 152    No results for input(s): "NA", "K", "CL", "CO2", "GLUCOSE", "BUN", "CREATININE", "CALCIUM" in the last 72 hours.   Intake/Output Summary (Last 24 hours) at 01/16/2024 0825 Last data filed at 01/16/2024 0700 Gross per 24 hour  Intake 440 ml  Output --  Net 440 ml     Pressure Injury 01/12/24 Buttocks Left Deep Tissue Pressure Injury - Purple or maroon localized area of  discolored intact skin or blood-filled blister due to damage of underlying soft tissue from pressure and/or shear. Purple DTI 2cm round (Active)  01/12/24 1830  Location: Buttocks  Location Orientation: Left  Staging: Deep Tissue Pressure Injury - Purple or maroon localized area of discolored intact skin or blood-filled blister due to damage of underlying soft tissue from pressure and/or shear.  Wound Description (Comments): Purple DTI 2cm round  Present on Admission: Yes    Physical Exam: Vital Signs Blood pressure 102/73, pulse 74, temperature 97.8 F (36.6 C), temperature source Oral, resp. rate 17, height 5\' 3"  (1.6 m), weight 67.9 kg, SpO2 98%. Gen: no distress, normal appearing.  Sitting upright in bedside chair.   HEENT: oral mucosa pink and moist, NCAT Cardio: Reg rate and rhythm Chest: normal effort, normal rate of breathing Abd: soft, normal bowel sounds, non-distended but protuberant Ext: no edema Psych: pleasant, flat affect Skin: intact; right chest dialysis port C/D/I Neuro: No focal neurologic deficits.  Awake, alert and oriented x 4.  Moving all 4 extremities antigravity and against resistance.  Touch intact.  Reflexes normal.  No tone   Assessment/Plan: 1. Functional deficits which require 3+ hours per day of interdisciplinary therapy in a comprehensive inpatient rehab setting. Physiatrist is providing  close team supervision and 24 hour management of active medical problems listed below. Physiatrist and rehab team continue to assess barriers to discharge/monitor patient progress toward functional and medical goals  Care Tool:  Bathing    Body parts bathed by patient: Right arm, Left arm, Chest, Abdomen, Front perineal area, Right upper leg, Left upper leg, Face, Right lower leg, Left lower leg   Body parts bathed by helper: Left lower leg, Right lower leg, Buttocks     Bathing assist Assist Level: Minimal Assistance - Patient > 75%     Upper Body  Dressing/Undressing Upper body dressing   What is the patient wearing?: Pull over shirt    Upper body assist Assist Level: Supervision/Verbal cueing    Lower Body Dressing/Undressing Lower body dressing      What is the patient wearing?: Pants     Lower body assist Assist for lower body dressing: Moderate Assistance - Patient 50 - 74%     Toileting Toileting    Toileting assist Assist for toileting: Maximal Assistance - Patient 25 - 49%     Transfers Chair/bed transfer  Transfers assist     Chair/bed transfer assist level: Minimal Assistance - Patient > 75%     Locomotion Ambulation   Ambulation assist      Assist level: Minimal Assistance - Patient > 75% Assistive device: Walker-rolling Max distance: 36   Walk 10 feet activity   Assist     Assist level: Minimal Assistance - Patient > 75% Assistive device: Walker-rolling   Walk 50 feet activity   Assist Walk 50 feet with 2 turns activity did not occur: Safety/medical concerns (fatigue)         Walk 150 feet activity   Assist Walk 150 feet activity did not occur: Safety/medical concerns (fatigue)         Walk 10 feet on uneven surface  activity   Assist Walk 10 feet on uneven surfaces activity did not occur: Safety/medical concerns (fatigue)         Wheelchair     Assist Is the patient using a wheelchair?: Yes Type of Wheelchair: Manual    Wheelchair assist level: Dependent - Patient 0% Max wheelchair distance: 97ft    Wheelchair 50 feet with 2 turns activity    Assist        Assist Level: Dependent - Patient 0%   Wheelchair 150 feet activity     Assist      Assist Level: Dependent - Patient 0%   Blood pressure 102/73, pulse 74, temperature 97.8 F (36.6 C), temperature source Oral, resp. rate 17, height 5\' 3"  (1.6 m), weight 67.9 kg, SpO2 98%.  Medical Problem List and Plan: 1. Functional deficits secondary cardiab debility complicated by AKI              -patient may shower             -ELOS/Goals: 8-11 days S             -Stable to continue CIR  2.  Antithrombotics: -DVT/anticoagulation:  Pharmaceutical: Coumadin             -antiplatelet therapy:  3. Pain Management: denies pain. Tylenol prn mild pain and oxycodone prn severe pain. 4. Mood/Behavior/Sleep: Team to provide ego support. LCSW to follow for evaluation and support.              --ON Seroquel and low dose Remeron at nights. Melatonin prn added.              -  antipsychotic agents: Seroquel daily at bedtime.  5. Neuropsych/cognition: This patient is capable of making decisions on her own behalf. 6. Skin/Wound Care: routine pressure relief measures. Monitor wound for healing  -2-15: Greater than 2 weeks postop open cardiac surgery; order to remove abdominal sutures  7. Fluids/Electrolytes/Nutrition:  Monitor I/O. Not on FR at this time             --Renal restrictions added to diet. Changed ensure to nephro             --continue vitamin C and protein supplement  8. Afib/A flutter: Monitor HR TID and for symptoms with activity.  --Continue Amiodarone 200 mg daily with coumadin. -Heart rate well-controlled  9. Hypotension: TEDs. Midodrine 20 mg TID. KHT for support. Add fludricortisone 0.5mg  daily, continue, improved   -Blood pressure remains stable, mildly hypotensive  10. Acute on CKD: Now on HD MWF due to ischemic ATN/shock. Schedule HD at the end of the day to help with tolerance of therapy             --continue Phoslo for now--should improve with renal restrictions.   Continue fluid restriction  -2/15: No labs with dialysis yesterday; nephrology following   11. Acute on chronic anemia: continue Aranesp weekly    12. HFrEF: Strict I/O. Daily weights. Fluid status being managed with HD.   -Weights approximately stable around dialysis [facility] Weights   01/15/24 0045 01/15/24 0606 01/16/24 0533  Weight: 66.5 kg 68.2 kg 67.9 kg    22. Depression: continue  Zoloft, Seroquel and Remeron. Provide ego support.    23. Constipation: Has finally had BM after enema/miralax. D/c enema. Add daily colace  -2-15 last bowel movement, large  24. Fatigue: add fludricortisone 0.5mg  daily, continue  25. Hyperglycemia: d/c feeding supplement  -Lead sugar well-controlled  26.  Nausea/vomiting.  -2/15: Severe bout yesterday afternoon.  No abdominal pain or headache, no focal neurologic changes, EKG without ST changes.  Change medications Zofran 8 mg every 8 hours as needed, with Compazine for breakthrough  2-16: Resolved today  LOS: 4 days A FACE TO FACE EVALUATION WAS PERFORMED  Angelina Sheriff 01/16/2024, 8:25 AM

## 2024-01-16 NOTE — Plan of Care (Signed)
  Problem: Consults Goal: RH GENERAL PATIENT EDUCATION Description: See Patient Education module for education specifics. 01/16/2024 0442 by Vella Raring, RN Outcome: Progressing 01/16/2024 0442 by Vella Raring, RN Outcome: Progressing   Problem: RH BOWEL ELIMINATION Goal: RH STG MANAGE BOWEL WITH ASSISTANCE Description: STG Manage Bowel with toileting Assistance. 01/16/2024 0442 by Vella Raring, RN Outcome: Progressing 01/16/2024 0442 by Vella Raring, RN Outcome: Progressing Goal: RH STG MANAGE BOWEL W/MEDICATION W/ASSISTANCE Description: STG Manage Bowel with Medication with mod I Assistance. 01/16/2024 0442 by Vella Raring, RN Outcome: Progressing 01/16/2024 0442 by Vella Raring, RN Outcome: Progressing   Problem: RH SKIN INTEGRITY Goal: RH STG SKIN FREE OF INFECTION/BREAKDOWN Description: Manage skin w min assist 01/16/2024 0442 by Vella Raring, RN Outcome: Progressing 01/16/2024 0442 by Vella Raring, RN Outcome: Progressing Goal: RH STG MAINTAIN SKIN INTEGRITY WITH ASSISTANCE Description: STG Maintain Skin Integrity With min  Assistance. 01/16/2024 0442 by Vella Raring, RN Outcome: Progressing 01/16/2024 0442 by Vella Raring, RN Outcome: Progressing Goal: RH STG ABLE TO PERFORM INCISION/WOUND CARE W/ASSISTANCE Description: STG Able To Perform Incision/Wound Care With min Assistance. Outcome: Progressing   Problem: RH SAFETY Goal: RH STG ADHERE TO SAFETY PRECAUTIONS W/ASSISTANCE/DEVICE Description: STG Adhere to Safety Precautions With cues Assistance/Device. Outcome: Progressing   Problem: RH KNOWLEDGE DEFICIT GENERAL Goal: RH STG INCREASE KNOWLEDGE OF SELF CARE AFTER HOSPITALIZATION Description: Patient and family will be able to manage care at discharge using educational resources for medication and dietary modification independently Outcome: Progressing

## 2024-01-16 NOTE — Progress Notes (Signed)
PHARMACY - ANTICOAGULATION CONSULT NOTE  Pharmacy Consult for Warfarin Indication: atrial fibrillation  Allergies  Allergen Reactions   Spironolactone     hyperkalemia    Patient Measurements: Height: 5\' 3"  (160 cm) Weight: 67.9 kg (149 lb 11.1 oz) IBW/kg (Calculated) : 52.4 Heparin Dosing Weight: ~ 70 kg  Vital Signs: Temp: 97.8 F (36.6 C) (02/16 0532) Temp Source: Oral (02/16 0532) BP: 102/73 (02/16 0532) Pulse Rate: 74 (02/16 0532)  Labs: Recent Labs    01/14/24 0341 01/15/24 1304 01/16/24 0231  HGB  --  12.4  --   HCT  --  39.2  --   PLT  --  152  --   LABPROT 31.0* 32.3* 31.8*  INR 3.0* 3.1* 3.1*   Estimated Creatinine Clearance: 9.1 mL/min (A) (by C-G formula based on SCr of 5.16 mg/dL (H)).  Assessment: 73 yo female s/p scheduled bioprosthetic mitral valve replacement with tissue valve 12/30 now with afib, pharmacy asked to dose anticoagulation with IV heparin. Spontaneously converted 1/7; flipped back to AF overnight 1/9 0300. Baseline INR 1.1, slow uptrend to 1.3 s/p 6 days warfarin therapy, total dose 20 mg, then held 1/21-24 for tunneled access plans. Patient now s/p [facility] and tunneled central line placement 1/24 AM on CRRT, plan HD MWF.  Off norepinephrine since 1/26, remains on midodrine. Resumed anticoagulation per VIR after tunneled cath placement. Heparin stopped 1/31 with INR >2.  11/14/24 UPDATE:  INR  = 3.0 >3.1>3.1, slightly supratherapeutic.  CBC stable on 01/15/24 with Hgb 12.4 and pltc 152K on 2/15. No bleeding reported. Still seeing the effects of 3mg  of warfarin. Two mg given yesterday. Amiodarone may increase warfarin effect. Although he has been on amiodarone since [facility] acute care admit date 11/29/23.   Will give a dose of 1mg  tonight and  check INR in AM.   Goal of Therapy:  INR goal 2 - 3 (bMVR) Monitor platelets by anticoagulation protocol: Yes   Plan:  Coumadin 1mg  today x1 Daily INR    Jeanella Cara, PharmD, [facility] Clinical  Pharmacist Please see AMION for all Pharmacists' Contact Phone Numbers 01/16/2024, 7:18 AM

## 2024-01-16 NOTE — Progress Notes (Signed)
Vanessa [name]  Assessment/ Plan: Pt is a 73 y.o. yo female     1. Acute kidney Injury on chronic kidney disease stage IIIa: Appears to be primarily from ischemic ATN in the setting of shock (mixed vasoplegic/cardiogenic).  Dense acute kidney injury with dialysis dependency and no evidence of renal recovery yet.  Receiving dialysis MWF schedule, next HD tomorrow Pt has been accepted at [facility] on MWF 12:10 pm chair time. Pt will need to arrive at 11:15 am for paperwork on first day. Do not know discharge date from [place] yet  2.  Severe mitral regurgitation/tricuspid regurgitation: Status post repair with postoperative cardiac tamponade.  Additional management per cardiothoracic surgery.  3.  Acute exacerbation of congestive heart failure with reduced ejection fraction: Volume status significantly improved with ongoing hemodialysis/ultrafiltration.  4.  Shock: From a combination of cardiogenic and vasoplegic etiology-currently on midodrine after previously requiring pressor/inotropic support.  5.  Hyperphosphatemia: Phosphorus level currently at goal for which we will continue phosphorus binder ( fosrenol) and continue low phosphorus/renal diet.  6. Anemia of CKD; Hb at goal.   Subjective:  Seen in room.  No issues today.  Feeling OK  Objective Vital signs in last 24 hours: Vitals:   01/15/24 1332 01/15/24 1919 01/16/24 0532 01/16/24 0533  BP: 95/63 106/72 102/73   Pulse: 89 86 74   Resp: 19 18 17    Temp: 97.6 F (36.4 C) 98.1 F (36.7 C) 97.8 F (36.6 C)   TempSrc: Oral Oral Oral   SpO2: 98% 97% 98%   Weight:    67.9 kg  Height:       Weight change: 0.4 kg  Intake/Output Summary (Last 24 hours) at 01/16/2024 1016 Last data filed at 01/16/2024 0850 Gross per 24 hour  Intake 680 ml  Output --  Net 680 ml       Labs: RENAL PANEL Recent Labs  Lab 01/10/24 0500 01/10/24 1352 01/11/24 0505 01/12/24 0605 01/12/24 0719  NA  --  133*   --   --  134*  K  --  4.3  --   --  4.4  CL  --  91*  --   --  94*  CO2  --  22  --   --  24  GLUCOSE  --  113*  --   --  154*  BUN  --  59*  --   --  34*  CREATININE  --  7.12*  --   --  5.16*  CALCIUM  --  9.6  --   --  9.2  MG 2.5*  --  2.1 2.3  --   PHOS  --  5.3*  --   --  5.0*  ALBUMIN  --  3.0*  --   --  3.1*    Liver Function Tests: Recent Labs  Lab 01/10/24 1352 01/12/24 0719  ALBUMIN 3.0* 3.1*   No results for input(s): "LIPASE", "AMYLASE" in the last 168 hours. No results for input(s): "AMMONIA" in the last 168 hours. CBC: Recent Labs    01/08/24 0640 01/09/24 0350 01/10/24 0500 01/12/24 0605 01/15/24 1304  HGB 11.8* 11.4* 11.1* 11.8* 12.4  MCV 100.3* 98.9 98.0 97.9 96.1    Cardiac Enzymes: No results for input(s): "CKTOTAL", "CKMB", "CKMBINDEX", "TROPONINI" in the last 168 hours. CBG: Recent Labs  Lab 01/15/24 0551 01/15/24 1219 01/15/24 1638 01/15/24 2051 01/16/24 0619  GLUCAP 123* 133* 164* 145* 101*    Iron Studies: No results  for input(s): "IRON", "TIBC", "TRANSFERRIN", "FERRITIN" in the last 72 hours. Studies/Results: DG Chest 1 View Result Date: 01/16/2024 CLINICAL DATA:  Evaluate for central line complication EXAM: PORTABLE CHEST 1 VIEW COMPARISON:  12/24/2023 FINDINGS: Dialysis catheter is again identified and stable. Left tunneled PICC is seen with the catheter tip at the cavoatrial junction. Postsurgical changes are noted. Elevation of left hemidiaphragm is seen with left basilar atelectasis. Right lung is clear. IMPRESSION: Left basilar atelectasis. Tubes and lines as described above stable from the prior exam. Electronically Signed   By: Alcide Clever M.D.   On: 01/16/2024 01:10    Medications: Infusions:  anticoagulant sodium citrate      Scheduled Medications:  amiodarone  200 mg Oral Daily   ascorbic acid  250 mg Oral BID   Chlorhexidine Gluconate Cloth  6 each Topical Q12H   cholecalciferol  1,000 Units Oral Daily   docusate  sodium  100 mg Oral Daily   fludrocortisone  0.05 mg Oral Daily   Gerhardt's butt cream   Topical BID   lanthanum  500 mg Oral TID WC   midodrine  20 mg Oral TID WC   mirtazapine  7.5 mg Oral QHS   multivitamin  1 tablet Oral BID   polyethylene glycol  17 g Oral Daily   QUEtiapine  25 mg Oral QHS   senna-docusate  2 tablet Oral BID   sertraline  25 mg Oral Daily   sodium chloride flush  10-40 mL Intracatheter Q12H   warfarin  1 mg Oral ONCE-1600   Warfarin - Pharmacist Dosing Inpatient   Does not apply q1600    have reviewed scheduled and prn medications.  Physical Exam: General:NAD, comfortable Heart:RRR, s1s2 nl Lungs:clear b/l, no crackle Abdomen:soft, Non-tender, non-distended Extremities: Trace lower extremities edema present Dialysis Access: Right IJ TDC in place  Nuri Larmer 01/16/2024,10:16 AM  LOS: 4 days

## 2024-01-17 DIAGNOSIS — R5381 Other malaise: Secondary | ICD-10-CM | POA: Diagnosis not present

## 2024-01-17 LAB — GLUCOSE, CAPILLARY
Glucose-Capillary: 119 mg/dL — ABNORMAL HIGH (ref 70–99)
Glucose-Capillary: 112 mg/dL — ABNORMAL HIGH (ref 70–99)
Glucose-Capillary: 102 mg/dL — ABNORMAL HIGH (ref 70–99)
Glucose-Capillary: 114 mg/dL — ABNORMAL HIGH (ref 70–99)

## 2024-01-17 LAB — PROTIME-INR
INR: 2.8 — ABNORMAL HIGH (ref 0.8–1.2)
Prothrombin Time: 30 s — ABNORMAL HIGH (ref 11.4–15.2)

## 2024-01-17 MED ORDER — WARFARIN SODIUM 2.5 MG PO TABS
2.5000 mg | ORAL_TABLET | Freq: Once | ORAL | Status: AC
  Administered 2024-01-17: 2.5 mg via ORAL
  Filled 2024-01-17: qty 1

## 2024-01-17 NOTE — Progress Notes (Signed)
PROGRESS NOTE   Subjective/Complaints: Sutures removed by nursing yesterday Fatigue improved so will trial off of hydrocortisone, discussed with OT and patient  ROS:   ROS: Denies fevers, chills,   abdominal pain, SOB, chest pain, new weakness or paraesthesias.   + Nausea/vomiting-intermittent  Objective:   DG Chest 1 View Result Date: 01/16/2024 CLINICAL DATA:  Evaluate for central line complication EXAM: PORTABLE CHEST 1 VIEW COMPARISON:  12/24/2023 FINDINGS: Dialysis catheter is again identified and stable. Left tunneled PICC is seen with the catheter tip at the cavoatrial junction. Postsurgical changes are noted. Elevation of left hemidiaphragm is seen with left basilar atelectasis. Right lung is clear. IMPRESSION: Left basilar atelectasis. Tubes and lines as described above stable from the prior exam. Electronically Signed   By: Alcide Clever M.D.   On: 01/16/2024 01:10   Recent Labs    01/15/24 1304  WBC 10.3  HGB 12.4  HCT 39.2  PLT 152    No results for input(s): "NA", "K", "CL", "CO2", "GLUCOSE", "BUN", "CREATININE", "CALCIUM" in the last 72 hours.   Intake/Output Summary (Last 24 hours) at 01/17/2024 0955 Last data filed at 01/16/2024 1301 Gross per 24 hour  Intake 240 ml  Output --  Net 240 ml     Pressure Injury 01/12/24 Buttocks Left Deep Tissue Pressure Injury - Purple or maroon localized area of discolored intact skin or blood-filled blister due to damage of underlying soft tissue from pressure and/or shear. Purple DTI 2cm round (Active)  01/12/24 1830  Location: Buttocks  Location Orientation: Left  Staging: Deep Tissue Pressure Injury - Purple or maroon localized area of discolored intact skin or blood-filled blister due to damage of underlying soft tissue from pressure and/or shear.  Wound Description (Comments): Purple DTI 2cm round  Present on Admission: Yes    Physical Exam: Vital Signs Blood  pressure 97/74, pulse 76, temperature 98 F (36.7 C), temperature source Oral, resp. rate 18, height 5\' 3"  (1.6 m), weight 67.8 kg, SpO2 96%. Gen: no distress, normal appearing.  Sitting upright in bedside chair.   HEENT: oral mucosa pink and moist, NCAT Cardio: Reg rate and rhythm Chest: normal effort, normal rate of breathing Abd: soft, normal bowel sounds, non-distended but protuberant Ext: no edema Psych: pleasant, flat affect Skin: intact; right chest dialysis port C/D/I Neuro: No focal neurologic deficits.  Awake, alert and oriented x 4.  Moving all 4 extremities antigravity and against resistance.  Touch intact.  Reflexes normal.  No tone, stable 01/17/24   Assessment/Plan: 1. Functional deficits which require 3+ hours per day of interdisciplinary therapy in a comprehensive inpatient rehab setting. Physiatrist is providing close team supervision and 24 hour management of active medical problems listed below. Physiatrist and rehab team continue to assess barriers to discharge/monitor patient progress toward functional and medical goals  Care Tool:  Bathing    Body parts bathed by patient: Right arm, Left arm, Chest, Abdomen, Front perineal area, Right upper leg, Left upper leg, Face, Right lower leg, Left lower leg   Body parts bathed by helper: Left lower leg, Right lower leg, Buttocks     Bathing assist Assist Level: Minimal Assistance - Patient > 75%  Upper Body Dressing/Undressing Upper body dressing   What is the patient wearing?: Pull over shirt    Upper body assist Assist Level: Supervision/Verbal cueing    Lower Body Dressing/Undressing Lower body dressing      What is the patient wearing?: Pants     Lower body assist Assist for lower body dressing: Contact Guard/Touching assist     Toileting Toileting    Toileting assist Assist for toileting: Maximal Assistance - Patient 25 - 49%     Transfers Chair/bed transfer  Transfers assist      Chair/bed transfer assist level: Minimal Assistance - Patient > 75%     Locomotion Ambulation   Ambulation assist      Assist level: Minimal Assistance - Patient > 75% Assistive device: Walker-rolling Max distance: 36   Walk 10 feet activity   Assist     Assist level: Minimal Assistance - Patient > 75% Assistive device: Walker-rolling   Walk 50 feet activity   Assist Walk 50 feet with 2 turns activity did not occur: Safety/medical concerns (fatigue)         Walk 150 feet activity   Assist Walk 150 feet activity did not occur: Safety/medical concerns (fatigue)         Walk 10 feet on uneven surface  activity   Assist Walk 10 feet on uneven surfaces activity did not occur: Safety/medical concerns (fatigue)         Wheelchair     Assist Is the patient using a wheelchair?: Yes Type of Wheelchair: Manual    Wheelchair assist level: Dependent - Patient 0% Max wheelchair distance: 96ft    Wheelchair 50 feet with 2 turns activity    Assist        Assist Level: Dependent - Patient 0%   Wheelchair 150 feet activity     Assist      Assist Level: Dependent - Patient 0%   Blood pressure 97/74, pulse 76, temperature 98 F (36.7 C), temperature source Oral, resp. rate 18, height 5\' 3"  (1.6 m), weight 67.8 kg, SpO2 96%.  Medical Problem List and Plan: 1. Functional deficits secondary cardiab debility complicated by AKI             -patient may shower             -ELOS/Goals: 8-11 days S             -Continue CIR  2.  Antithrombotics: -DVT/anticoagulation:  Pharmaceutical: Coumadin, INR reviewed and stable             -antiplatelet therapy:  3. Pain Management: denies pain. Tylenol prn mild pain and oxycodone prn severe pain. 4. Mood/Behavior/Sleep: Team to provide ego support. LCSW to follow for evaluation and support.              --ON Seroquel and low dose Remeron at nights. Melatonin prn added.              -antipsychotic  agents: Seroquel daily at bedtime.  5. Neuropsych/cognition: This patient is capable of making decisions on her own behalf. 6. Skin/Wound Care: routine pressure relief measures. Monitor wound for healing  -2-15: Greater than 2 weeks postop open cardiac surgery; order to remove abdominal sutures  7. Fluids/Electrolytes/Nutrition:  Monitor I/O. Not on FR at this time             --Renal restrictions added to diet. Changed ensure to nephro             --continue  vitamin C and protein supplement  8. Afib/A flutter: Monitor HR TID and for symptoms with activity.  --Continue Amiodarone 200 mg daily with coumadin. -Heart rate well-controlled  9. Hypotension: TEDs. Midodrine 20 mg TID. KHT for support. Add fludricortisone 0.5mg  daily, continue, improved   -Blood pressure remains stable, mildly hypotensive  10. Acute on CKD: Now on HD MWF due to ischemic ATN/shock. Schedule HD at the end of the day to help with tolerance of therapy             --continue Phoslo for now--should improve with renal restrictions.   Continue fluid restriction  -2/15: No labs with dialysis yesterday; nephrology following   11. Acute on chronic anemia: continue Aranesp weekly    12. HFrEF: Strict I/O. Daily weights. Fluid status being managed with HD.   -Weights approximately stable around dialysis [facility] Weights   01/15/24 0606 01/16/24 0533 01/17/24 9604  Weight: 68.2 kg 67.9 kg 67.8 kg    22. Depression: continue Zoloft, Seroquel and Remeron. Provide ego support.    23. Constipation: Has finally had BM after enema/miralax. D/c enema. Add daily colace  -2-15 last bowel movement, large  24. Fatigue: improved, will d/c fludricortisone  25. Hyperglycemia: d/c feeding supplement  Improved, decrease CBG checks to AC  26.  Nausea/vomiting.  resolved  LOS: 5 days A FACE TO FACE EVALUATION WAS PERFORMED  Drema Pry Quindell Shere 01/17/2024, 9:55 AM

## 2024-01-17 NOTE — Progress Notes (Signed)
Physical Therapy Session Note  Patient Details  Name: Vanessa [name] MRN: [id number] Date of Birth: Apr 11, 1951  Today's Date: 01/17/2024 PT Individual Time: 0900-0957 PT Individual Time Calculation (min): 57 min   Short Term Goals: Week 1:  PT Short Term Goal 1 (Week 1): pt will transfer supine<>sitting EOB with CGA PT Short Term Goal 2 (Week 1): pt will transfer sit<>stand with LRAD and min A consistantly PT Short Term Goal 3 (Week 1): pt will ambulate 64ft with LRAD and CGA  Skilled Therapeutic Interventions/Progress Updates:   Received pt sitting in WC, pt agreeable to PT treatment, and denied any pain during session. Session with emphasis on functional mobility/transfers, generalized strengthening and endurance, dynamic standing balance/coordination, and gait training. Pt transported to/from room in [facility] dependently for time management purposes. Attempted multiple stands from [facility] with max A, but pt not leaning far enough anteriorly to clear buttocks and fearful of falling - ultimately required mod A +2 to stand from [facility]. Pt then ambulated 150ft with RW and min A fading to CGA. Took seated rest break on mat, and adjusted RW, then worked on sit<>stands from 23in high mat x 2 trials with max A (+2 providing min A).   Worked on anterior weight shifting reaching forward to grab cones x 2 trials, then stood x 2 additional trials with mod A (pt with improvements in anterior weight shifting) with cues to "push through legs". Pt with good recall of cues to back up to mat and feel on back of legs prior to sitting. Stood final time with heavy min A (+2 on standby) and ambulated additional 32ft with RW and min A to fatigue. Returned to room and concluded session with pt sitting in WC, needs within reach, and chair pad alarm on.   Therapy Documentation Precautions:  Precautions Precautions: Fall, Sternal Restrictions Weight Bearing Restrictions Per Provider Order: No LUE Weight Bearing Per Provider  Order: Non weight bearing Other Position/Activity Restrictions: sternal precautions  Therapy/Group: Individual Therapy Marlana Salvage Zaunegger Blima Rich PT, DPT 01/17/2024, 7:03 AM

## 2024-01-17 NOTE — Progress Notes (Signed)
[facility] NEPHROLOGY PROGRESS NOTE  Assessment/ Plan: Pt is a 73 y.o. yo female     1.  AKI on CKD 3 yea: ATN from shock.  Continue dialysis on MWF schedule.  Accepted to [facility] MWF 12:10 pm chair time. Pt will need to arrive at 11:15 am for paperwork on first day. Do not know discharge date from [place] [facility] yet  2.  Severe mitral regurgitation/tricuspid regurgitation: Status post repair with postoperative cardiac tamponade.  Additional management per cardiothoracic surgery. Stable at this time  3.  Acute exacerbation of congestive heart failure with reduced ejection fraction: Volume status significantly improved with ongoing hemodialysis/ultrafiltration.  4.  Shock: From a combination of cardiogenic and vasoplegic etiology-currently on midodrine after previously requiring pressor/inotropic support. Now on high dose midodrine  5.  Hyperphosphatemia: Phosphorus at goal. Cont binder  6. Anemia of CKD; Hb at goal.   Subjective: Patient states she feels well.  No complaints  Objective Vital signs in last 24 hours: Vitals:   01/16/24 1258 01/16/24 1921 01/17/24 0518 01/17/24 0607  BP: (!) 98/57 111/74 97/74   Pulse: 79 84 76   Resp: 18 18 18    Temp: 97.7 F (36.5 C) 98 F (36.7 C) 98 F (36.7 C)   TempSrc: Oral Oral Oral   SpO2: 99% 97% 96%   Weight:    67.8 kg  Height:       Weight change: -0.1 kg  Intake/Output Summary (Last 24 hours) at 01/17/2024 1002 Last data filed at 01/16/2024 1301 Gross per 24 hour  Intake 240 ml  Output --  Net 240 ml       Labs: RENAL PANEL Recent Labs  Lab 01/10/24 1352 01/11/24 0505 01/12/24 0605 01/12/24 0719  NA 133*  --   --  134*  K 4.3  --   --  4.4  CL 91*  --   --  94*  CO2 22  --   --  24  GLUCOSE 113*  --   --  154*  BUN 59*  --   --  34*  CREATININE 7.12*  --   --  5.16*  CALCIUM 9.6  --   --  9.2  MG  --  2.1 2.3  --   PHOS 5.3*  --   --  5.0*  ALBUMIN 3.0*  --   --  3.1*    Liver Function Tests: Recent  Labs  Lab 01/10/24 1352 01/12/24 0719  ALBUMIN 3.0* 3.1*   No results for input(s): "LIPASE", "AMYLASE" in the last 168 hours. No results for input(s): "AMMONIA" in the last 168 hours. CBC: Recent Labs    01/08/24 0640 01/09/24 0350 01/10/24 0500 01/12/24 0605 01/15/24 1304  HGB 11.8* 11.4* 11.1* 11.8* 12.4  MCV 100.3* 98.9 98.0 97.9 96.1    Cardiac Enzymes: No results for input(s): "CKTOTAL", "CKMB", "CKMBINDEX", "TROPONINI" in the last 168 hours. CBG: Recent Labs  Lab 01/16/24 0619 01/16/24 1139 01/16/24 1714 01/16/24 2042 01/17/24 0555  GLUCAP 101* 108* 104* 144* 119*    Iron Studies: No results for input(s): "IRON", "TIBC", "TRANSFERRIN", "FERRITIN" in the last 72 hours. Studies/Results: DG Chest 1 View Result Date: 01/16/2024 CLINICAL DATA:  Evaluate for central line complication EXAM: PORTABLE CHEST 1 VIEW COMPARISON:  12/24/2023 FINDINGS: Dialysis catheter is again identified and stable. Left tunneled PICC is seen with the catheter tip at the cavoatrial junction. Postsurgical changes are noted. Elevation of left hemidiaphragm is seen with left basilar atelectasis. Right lung is clear.  IMPRESSION: Left basilar atelectasis. Tubes and lines as described above stable from the prior exam. Electronically Signed   By: Alcide Clever M.D.   On: 01/16/2024 01:10    Medications: Infusions:  anticoagulant sodium citrate      Scheduled Medications:  amiodarone  200 mg Oral Daily   ascorbic acid  250 mg Oral BID   Chlorhexidine Gluconate Cloth  6 each Topical Q12H   cholecalciferol  1,000 Units Oral Daily   docusate sodium  100 mg Oral Daily   Gerhardt's butt cream   Topical BID   lanthanum  500 mg Oral TID WC   midodrine  20 mg Oral TID WC   mirtazapine  7.5 mg Oral QHS   multivitamin  1 tablet Oral BID   polyethylene glycol  17 g Oral Daily   QUEtiapine  25 mg Oral QHS   senna-docusate  2 tablet Oral BID   sertraline  25 mg Oral Daily   sodium chloride flush  10-40  mL Intracatheter Q12H   Warfarin - Pharmacist Dosing Inpatient   Does not apply q1600    have reviewed scheduled and prn medications.  Physical Exam: General:NAD, comfortable Heart:normal rate, no rub Lungs: Bilateral chest rise with no increased work of breathing Abdomen:soft, Non-tender, non-distended Extremities: Edema at the bilateral ankles, warm and well-perfused Dialysis Access: Right IJ TDC in place  Darnell Level 01/17/2024,10:02 AM  LOS: 5 days

## 2024-01-17 NOTE — Progress Notes (Signed)
PHARMACY - ANTICOAGULATION CONSULT NOTE  Pharmacy Consult for Warfarin Indication: atrial fibrillation  Allergies  Allergen Reactions   Spironolactone     hyperkalemia    Patient Measurements: Height: 5\' 3"  (160 cm) Weight: 67.8 kg (149 lb 7.6 oz) IBW/kg (Calculated) : 52.4 Heparin Dosing Weight: ~ 70 kg  Vital Signs: Temp: 98 F (36.7 C) (02/17 0518) Temp Source: Oral (02/17 0518) BP: 97/74 (02/17 0518) Pulse Rate: 76 (02/17 0518)  Labs: Recent Labs    01/15/24 1304 01/16/24 0231 01/17/24 0627  HGB 12.4  --   --   HCT 39.2  --   --   PLT 152  --   --   LABPROT 32.3* 31.8* 30.0*  INR 3.1* 3.1* 2.8*   Estimated Creatinine Clearance: 9.1 mL/min (A) (by C-G formula based on SCr of 5.16 mg/dL (H)).  Assessment: 73 yo female s/p scheduled bioprosthetic mitral valve replacement with tissue valve 12/30 now with afib, pharmacy asked to dose anticoagulation with IV heparin. Spontaneously converted 1/7; flipped back to AF overnight 1/9 0300. Baseline INR 1.1, slow uptrend to 1.3 s/p 6 days warfarin therapy, total dose 20 mg, then held 1/21-24 for tunneled access plans. Patient now s/p [facility] and tunneled central line placement 1/24 AM on CRRT, plan HD MWF.  Off norepinephrine since 1/26, remains on midodrine. Resumed anticoagulation per VIR after tunneled cath placement. Heparin stopped 1/31 with INR >2.  11/15/24 UPDATE:  INR  =  2.8, therapeutic. (INR trend for last 4 days: 3.0 >3.1>3.1>2.8).   CBC stable on 01/15/24 with Hgb 12.4 and pltc 152K on 2/15. No bleeding reported. INR back in therapeutic range 2-3 after reducing dosages.  Amiodarone may increase warfarin effect. Although he has been on amiodarone since [facility] acute care admit date 11/29/23.    Goal of Therapy:  INR goal 2 - 3 (bMVR) Monitor platelets by anticoagulation protocol: Yes   Plan:  Coumadin 2.5 mg today x1 Daily INR    Noah Delaine, RPh Clinical Pharmacist Please see AMION for all Pharmacists' Contact Phone  Numbers 01/17/2024, 12:24 PM

## 2024-01-17 NOTE — Plan of Care (Signed)
  Problem: RH BOWEL ELIMINATION Goal: RH STG MANAGE BOWEL WITH ASSISTANCE Description: STG Manage Bowel with toileting Assistance. Outcome: Progressing Goal: RH STG MANAGE BOWEL W/MEDICATION W/ASSISTANCE Description: STG Manage Bowel with Medication with mod I Assistance. Outcome: Progressing   Problem: RH SKIN INTEGRITY Goal: RH STG SKIN FREE OF INFECTION/BREAKDOWN Description: Manage skin w min assist Outcome: Progressing Goal: RH STG MAINTAIN SKIN INTEGRITY WITH ASSISTANCE Description: STG Maintain Skin Integrity With min  Assistance. Outcome: Progressing   Problem: RH SAFETY Goal: RH STG ADHERE TO SAFETY PRECAUTIONS W/ASSISTANCE/DEVICE Description: STG Adhere to Safety Precautions With cues Assistance/Device. Outcome: Progressing

## 2024-01-17 NOTE — Plan of Care (Signed)
  Problem: Consults Goal: RH GENERAL PATIENT EDUCATION Description: See Patient Education module for education specifics. Outcome: Progressing   Problem: RH BOWEL ELIMINATION Goal: RH STG MANAGE BOWEL WITH ASSISTANCE Description: STG Manage Bowel with toileting Assistance. Outcome: Progressing Goal: RH STG MANAGE BOWEL W/MEDICATION W/ASSISTANCE Description: STG Manage Bowel with Medication with mod I Assistance. Outcome: Progressing   Problem: RH SKIN INTEGRITY Goal: RH STG SKIN FREE OF INFECTION/BREAKDOWN Description: Manage skin w min assist Outcome: Progressing Goal: RH STG MAINTAIN SKIN INTEGRITY WITH ASSISTANCE Description: STG Maintain Skin Integrity With min  Assistance. Outcome: Progressing Goal: RH STG ABLE TO PERFORM INCISION/WOUND CARE W/ASSISTANCE Description: STG Able To Perform Incision/Wound Care With min Assistance. Outcome: Progressing   Problem: RH SAFETY Goal: RH STG ADHERE TO SAFETY PRECAUTIONS W/ASSISTANCE/DEVICE Description: STG Adhere to Safety Precautions With cues Assistance/Device. Outcome: Progressing   Problem: RH KNOWLEDGE DEFICIT GENERAL Goal: RH STG INCREASE KNOWLEDGE OF SELF CARE AFTER HOSPITALIZATION Description: Patient and family will be able to manage care at discharge using educational resources for medication and dietary modification independently Outcome: Progressing

## 2024-01-17 NOTE — Progress Notes (Signed)
Occupational Therapy Session Note  Patient Details  Name: Vanessa [name] MRN: [id number] Date of Birth: [birth year]  Today's Date: 01/17/2024 OT Individual Time: 0805-0900 & 1050-1200 OT Individual Time Calculation (min): 55 min & 70 min   Short Term Goals: Week 1:  OT Short Term Goal 1 (Week 1): Pt will demonstrate improved sit to stand strength to be able to rise to stand from low w/c seat with min A. OT Short Term Goal 2 (Week 1): Pt will be able to stand for at least 3 minutes to engage in LB bathing tasks for bottom and perineal area. OT Short Term Goal 3 (Week 1): Pt will be able to don pull on pants with min A. OT Short Term Goal 4 (Week 1): Pt will complete toilet transfer and ambulation to toilet with RW with CGA.  Skilled Therapeutic Interventions/Progress Updates:  Session 1 Skilled OT intervention completed with focus on ADL retraining, functional sit > stands and ambulatory transfers. Pt received upright in bed, agreeable to session. No pain reported. Nephrology MD present for rounds.  Pt requested to wash up. Transitioned to EOB with supervision however significant time, but no assist, to scoot to EOB for feet to contact floor. Doffed shirt with good adherence to sternal precautions. Able to doff pants with lateral leans with extended time and effort, as pt preferred this method vs standing to "save the energy" and practice "scooting forward." Able to wash UB with set up A, front peri area and BLE with supervision. Education/demo provided on reacher and sock aid for LB dressing. Pt required + time to process/complete, but able to thread pants and socks with supervision using the AE. Knee TEDs donned dependently for time.  Pt verbalized fear with standing and notices her BLE tremble, which relates to what is seen during sit > stands when pt just waits for therapist to lift her then she assists, but we discussed revering the order I.e. loading her feet and attempting to stand then OT  assisting to carry through. With this technique, pt stood with heavy mod A with hands on thighs, then ambulated with CGA using RW > w/c at sink. Safety plan updated to reflect ambulatory level toilet transfers. Seated at sink, pt was independent with oral care. Pt remained seated in w/c, with chair alarm on/activated, and with all needs in reach at end of session.  Session 2 Skilled OT intervention completed with focus on ADL retraining, functional transfers, BUE/cardiovascular endurance, sit > stands. Pt received sitting on commode with direct care hand off from NT, agreeable to session. No pain reported.  Pt required time for BM, however able to wipe with 1 arm for posterior pericare. Pt attempted to put BUE on RW with heavy lean forward, requiring cues to adhere to sternal precautions, therefore pt transitioned hands to thighs, stood with min A. CGA for donning pants over hips, then used RW for CGA stand pivot > w/c. Poor eccentric control noted. Set up A for hand hygiene.   Transported dependently in w/c <> gym. Pt completed the following intervals while seated at the SCIFIT to promote global/BUE endurance needed for independence with BADLs and functional transfers (gentle ROM only due to sternal precautions): -3 min, level 1, forwards -3 min, level 1, forwards -3 min, level 1, backwards -3 min, level 1, backwards Intermittent rest breaks needed for fatigue  Transitioned to sit > stand trial. Pt required 3 attempts with heavy max A to only stand 1/2 way. Pt verbalized again, fear  with standing and overall fatigue from previous sessions. Discussed ways to manage comfort with standing including modifying surfaces at home to eliminate such difficulty with standing, building BLE strength and anterior weight shifting.  Back in room pt remained seated in w/c, with chair alarm on/activated, and with all needs in reach at end of session.   Therapy Documentation Precautions:   Precautions Precautions: Fall, Sternal Restrictions Weight Bearing Restrictions Per Provider Order: No LUE Weight Bearing Per Provider Order: Non weight bearing Other Position/Activity Restrictions: sternal precautions    Therapy/Group: Individual Therapy  [name], MS, OTR/L  01/17/2024, 12:07 PM

## 2024-01-18 DIAGNOSIS — R5381 Other malaise: Secondary | ICD-10-CM | POA: Diagnosis not present

## 2024-01-18 LAB — RENAL FUNCTION PANEL
Albumin: 3 g/dL — ABNORMAL LOW (ref 3.5–5.0)
Anion gap: 19 — ABNORMAL HIGH (ref 5–15)
BUN: 55 mg/dL — ABNORMAL HIGH (ref 8–23)
CO2: 21 mmol/L — ABNORMAL LOW (ref 22–32)
Calcium: 9.2 mg/dL (ref 8.9–10.3)
Chloride: 88 mmol/L — ABNORMAL LOW (ref 98–111)
Creatinine, Ser: 8.75 mg/dL — ABNORMAL HIGH (ref 0.44–1.00)
GFR, Estimated: 4 mL/min — ABNORMAL LOW (ref >60)
Glucose, Bld: 151 mg/dL — ABNORMAL HIGH (ref 70–99)
Phosphorus: 3.6 mg/dL (ref 2.5–4.6)
Potassium: 4.1 mmol/L (ref 3.5–5.1)
Sodium: 128 mmol/L — ABNORMAL LOW (ref 135–145)

## 2024-01-18 LAB — CBC
HCT: 35.4 % — ABNORMAL LOW (ref 36.0–46.0)
Hemoglobin: 11.5 g/dL — ABNORMAL LOW (ref 12.0–15.0)
MCH: 30.9 pg (ref 26.0–34.0)
MCHC: 32.5 g/dL (ref 30.0–36.0)
MCV: 95.2 fL (ref 80.0–100.0)
Platelets: 150 10*3/uL (ref 150–400)
RBC: 3.72 MIL/uL — ABNORMAL LOW (ref 3.87–5.11)
RDW: 16.3 % — ABNORMAL HIGH (ref 11.5–15.5)
WBC: 9.5 10*3/uL (ref 4.0–10.5)
nRBC: 0 % (ref 0.0–0.2)

## 2024-01-18 LAB — GLUCOSE, CAPILLARY
Glucose-Capillary: 108 mg/dL — ABNORMAL HIGH (ref 70–99)
Glucose-Capillary: 122 mg/dL — ABNORMAL HIGH (ref 70–99)
Glucose-Capillary: 130 mg/dL — ABNORMAL HIGH (ref 70–99)
Glucose-Capillary: 557 mg/dL (ref 70–99)

## 2024-01-18 LAB — PROTIME-INR
INR: 2.2 — ABNORMAL HIGH (ref 0.8–1.2)
Prothrombin Time: 24.5 s — ABNORMAL HIGH (ref 11.4–15.2)

## 2024-01-18 MED ORDER — LIDOCAINE HCL (PF) 1 % IJ SOLN: 5.0000 mL | INTRAMUSCULAR | Status: DC | PRN

## 2024-01-18 MED ORDER — ANTICOAGULANT SODIUM CITRATE 4% (200MG/5ML) IV SOLN: 5.0000 mL | Status: DC | PRN

## 2024-01-18 MED ORDER — WARFARIN SODIUM 2.5 MG PO TABS
2.5000 mg | ORAL_TABLET | Freq: Every day | ORAL | Status: AC
  Administered 2024-01-18: 2.5 mg via ORAL
  Filled 2024-01-18: qty 1

## 2024-01-18 MED ORDER — ALTEPLASE 2 MG IJ SOLR: 2.0000 mg | Freq: Once | INTRAMUSCULAR | Status: DC | PRN

## 2024-01-18 MED ORDER — LIDOCAINE-PRILOCAINE 2.5-2.5 % EX CREA: 1.0000 | TOPICAL_CREAM | CUTANEOUS | Status: DC | PRN

## 2024-01-18 MED ORDER — HEPARIN SODIUM (PORCINE) 1000 UNIT/ML IJ SOLN
3200.0000 [IU] | Freq: Once | INTRAMUSCULAR | Status: AC
  Administered 2024-01-18: 3200 [IU]
  Filled 2024-01-18: qty 4

## 2024-01-18 MED ORDER — HEPARIN SODIUM (PORCINE) 1000 UNIT/ML DIALYSIS: 1000.0000 [IU] | INTRAMUSCULAR | Status: DC | PRN

## 2024-01-18 MED ORDER — PENTAFLUOROPROP-TETRAFLUOROETH EX AERO: 1.0000 | INHALATION_SPRAY | CUTANEOUS | Status: DC | PRN

## 2024-01-18 NOTE — Progress Notes (Signed)
PROGRESS NOTE   Subjective/Complaints: No complaints this morning Therapy notes reviewed and is ambulating 100 feet MinA- messaged team to set d/c date  ROS:   ROS: Denies fevers, chills,   abdominal pain, SOB, chest pain, new weakness or paraesthesias.   + Nausea/vomiting-intermittent  Objective:   No results found.  Recent Labs    01/15/24 1304  WBC 10.3  HGB 12.4  HCT 39.2  PLT 152    No results for input(s): "NA", "K", "CL", "CO2", "GLUCOSE", "BUN", "CREATININE", "CALCIUM" in the last 72 hours.   Intake/Output Summary (Last 24 hours) at 01/18/2024 1049 Last data filed at 01/18/2024 1610 Gross per 24 hour  Intake 250 ml  Output --  Net 250 ml     Pressure Injury 01/12/24 Buttocks Left Deep Tissue Pressure Injury - Purple or maroon localized area of discolored intact skin or blood-filled blister due to damage of underlying soft tissue from pressure and/or shear. Purple DTI 2cm round (Active)  01/12/24 1830  Location: Buttocks  Location Orientation: Left  Staging: Deep Tissue Pressure Injury - Purple or maroon localized area of discolored intact skin or blood-filled blister due to damage of underlying soft tissue from pressure and/or shear.  Wound Description (Comments): Purple DTI 2cm round  Present on Admission: Yes    Physical Exam: Vital Signs Blood pressure (!) 94/56, pulse 74, temperature 98.5 F (36.9 C), resp. rate 17, height 5\' 3"  (1.6 m), weight 70.2 kg, SpO2 95%. Gen: no distress, normal appearing.  Sitting upright in bedside chair.   HEENT: oral mucosa pink and moist, NCAT Cardio: Reg rate and rhythm Chest: normal effort, normal rate of breathing Abd: soft, normal bowel sounds, non-distended but protuberant Ext: no edema Psych: pleasant, flat affect Skin: intact; right chest dialysis port C/D/I Neuro: No focal neurologic deficits.  Awake, alert and oriented x 4.  Moving all 4 extremities  antigravity and against resistance.  Touch intact.  Reflexes normal.  No tone, stable 2/18   Assessment/Plan: 1. Functional deficits which require 3+ hours per day of interdisciplinary therapy in a comprehensive inpatient rehab setting. Physiatrist is providing close team supervision and 24 hour management of active medical problems listed below. Physiatrist and rehab team continue to assess barriers to discharge/monitor patient progress toward functional and medical goals  Care Tool:  Bathing    Body parts bathed by patient: Right arm, Left arm, Chest, Abdomen, Front perineal area, Right upper leg, Left upper leg, Face, Right lower leg, Left lower leg   Body parts bathed by helper: Left lower leg, Right lower leg, Buttocks     Bathing assist Assist Level: Minimal Assistance - Patient > 75%     Upper Body Dressing/Undressing Upper body dressing   What is the patient wearing?: Pull over shirt    Upper body assist Assist Level: Supervision/Verbal cueing    Lower Body Dressing/Undressing Lower body dressing      What is the patient wearing?: Pants     Lower body assist Assist for lower body dressing: Contact Guard/Touching assist     Toileting Toileting    Toileting assist Assist for toileting: Maximal Assistance - Patient 25 - 49%  Transfers Chair/bed transfer  Transfers assist     Chair/bed transfer assist level: Contact Guard/Touching assist     Locomotion Ambulation   Ambulation assist      Assist level: Minimal Assistance - Patient > 75% Assistive device: Walker-rolling Max distance: 143ft   Walk 10 feet activity   Assist     Assist level: Minimal Assistance - Patient > 75% Assistive device: Walker-rolling   Walk 50 feet activity   Assist Walk 50 feet with 2 turns activity did not occur: Safety/medical concerns (fatigue)  Assist level: Minimal Assistance - Patient > 75% Assistive device: Walker-rolling    Walk 150 feet  activity   Assist Walk 150 feet activity did not occur: Safety/medical concerns (fatigue)         Walk 10 feet on uneven surface  activity   Assist Walk 10 feet on uneven surfaces activity did not occur: Safety/medical concerns (fatigue)         Wheelchair     Assist Is the patient using a wheelchair?: Yes Type of Wheelchair: Manual    Wheelchair assist level: Dependent - Patient 0% Max wheelchair distance: 2ft    Wheelchair 50 feet with 2 turns activity    Assist        Assist Level: Dependent - Patient 0%   Wheelchair 150 feet activity     Assist      Assist Level: Dependent - Patient 0%   Blood pressure (!) 94/56, pulse 74, temperature 98.5 F (36.9 C), resp. rate 17, height 5\' 3"  (1.6 m), weight 70.2 kg, SpO2 95%.  Medical Problem List and Plan: 1. Functional deficits secondary cardiab debility complicated by AKI             -patient may shower             -ELOS/Goals: 8-11 days S             -Continue CIR  Messaged team to set d/c date  2.  Antithrombotics: -DVT/anticoagulation:  Pharmaceutical: continue Coumadin, INR reviewed and stable             -antiplatelet therapy:   3. Pain Management: denies pain. Tylenol prn mild pain, d/c oxycodone  4. Mood/Behavior/Sleep: Team to provide ego support. LCSW to follow for evaluation and support.              --ON Seroquel and low dose Remeron at nights. Melatonin prn added.              -antipsychotic agents: Seroquel daily at bedtime.  5. Neuropsych/cognition: This patient is capable of making decisions on her own behalf. 6. Skin/Wound Care: routine pressure relief measures. Monitor wound for healing  7. Fluids/Electrolytes/Nutrition:  Monitor I/O. Not on FR at this time             --Renal restrictions added to diet. Changed ensure to nephro             --continue vitamin C and protein supplement  8. Afib/A flutter: Monitor HR TID and for symptoms with activity.  --Continue Amiodarone  200 mg daily with coumadin. -Heart rate well-controlled  9. Hypotension: TEDs. Midodrine 20 mg TID. KHT for support. Add fludricortisone 0.5mg  daily, continue, improved   -Blood pressure remains stable, mildly hypotensive  10. Acute on CKD: Now on HD MWF due to ischemic ATN/shock. Schedule HD at the end of the day to help with tolerance of therapy             --  continue Phoslo for now--should improve with renal restrictions.   Continue fluid restriction    11. Acute on chronic anemia: continue Aranesp weekly    12. HFrEF: Strict I/O. Daily weights. Fluid status being managed with HD.   -Weights approximately stable around dialysis [facility] Weights   01/16/24 0533 01/17/24 0607 01/18/24 0500  Weight: 67.9 kg 67.8 kg 70.2 kg    22. Depression: continue Zoloft, Seroquel and Remeron. Provide ego support.    23. Constipation: Has finally had BM after enema/miralax. D/c enema. Add daily colace  -2-15 last bowel movement, large  24. Fatigue: improved, will d/c fludricortisone  25. Type 2 diabetes: d/c feeding supplement  Improved, decrease CBG checks to BID  26.  Nausea/vomiting.  resolved  LOS: 6 days A FACE TO FACE EVALUATION WAS PERFORMED  Drema Pry Hurley Sobel 01/18/2024, 10:49 AM

## 2024-01-18 NOTE — Progress Notes (Signed)
Physical Therapy Session Note  Patient Details  Name: Vanessa [name] MRN: [id number] Date of Birth: Oct 19, 1951  Today's Date: 01/18/2024 PT Individual Time: 0730-0839 PT Individual Time Calculation (min): 69 min  PT Missed Time: 1400-1500 PT Missed Time Calculation: 60 minutes PT Missed Time Reason: Dialysis  Short Term Goals: Week 1:  PT Short Term Goal 1 (Week 1): pt will transfer supine<>sitting EOB with CGA PT Short Term Goal 2 (Week 1): pt will transfer sit<>stand with LRAD and min A consistantly PT Short Term Goal 3 (Week 1): pt will ambulate 60ft with LRAD and CGA  Skilled Therapeutic Interventions/Progress Updates:   Treatment Session 1 Received pt semi-reclined in bed, pt agreeable to PT treatment, and denied any pain during session. RN arrived to administer medications. Session with emphasis on functional mobility/transfers, generalized strengthening and endurance, sit<>stands, and gait training. Donned teds in supine with total A for edema management and non-skid socks with max A. Pt transferred semi-reclined<>sitting L EOB with HOB elevated and supervision with increased time while maintaining sternal precautions. Worked on reciprocal scooting and pt required increased time/effort to scoot hips to EOB.  Pt stood from elevated EOB with RW and mod A and ambulated 48ft with RW and CGA to fatigue. Transported to/from dayroom in [facility] dependently for energy conservation purposes. Stood from [facility] with RW and max A (cues to scoot to edge of chair and to bring nose over RW bar when standing) and transferred onto Nustep with RW and CGA. Pt required assist to get BLE on/off footplates and performed seated BLE strengthening on Nustep at workload 1 for 8 minutes for a total of 410 steps. Pt unable to complete full ROM reporting resistance was "too hard".  Stood from [employer] with RW and mod A and ambulated additional 63ft with RW and CGA to mat. Performed blocked practice sit<>stands form 23in high mat  with RW and min A - pt placing LUE on RW and RUE on thigh and demo improvements in anterior weight shifting compared to yesterday. Pt reported her legs "felt like jelly" and reporting 9/10 fatigue afterwards. Stood from [facility] with RW and min A and performed stand<>pivot transfer into WC with RW and CGA. Returned to room and concluded session with pt sitting in WC, needs within reach, and chair pad alarm on.   Treatment Session 2 Pt off unit for dialysis. Pt did not receive dialysis yesterday due to staffing shortage, therefore made up session today. Will attempt to make up missed time as pt becomes available. 60 minutes missed of skilled physical therapy.   Therapy Documentation Precautions:  Precautions Precautions: Fall, Sternal Restrictions Weight Bearing Restrictions Per Provider Order: No LUE Weight Bearing Per Provider Order: Non weight bearing Other Position/Activity Restrictions: sternal precautions  Therapy/Group: Individual Therapy Marlana Salvage Zaunegger Blima Rich PT, DPT 01/18/2024, 6:48 AM

## 2024-01-18 NOTE — Progress Notes (Signed)
PHARMACY - ANTICOAGULATION CONSULT NOTE  Pharmacy Consult for Warfarin Indication: atrial fibrillation  Allergies  Allergen Reactions   Spironolactone     hyperkalemia    Patient Measurements: Height: 5\' 3"  (160 cm) Weight: 67.9 kg (149 lb 11.1 oz) (bed) IBW/kg (Calculated) : 52.4 Heparin Dosing Weight: ~ 70 kg  Vital Signs: Temp: 98.5 F (36.9 C) (02/18 1326) BP: 115/78 (02/18 1430) Pulse Rate: 75 (02/18 1430)  Labs: Recent Labs    01/16/24 0231 01/17/24 0627 01/18/24 0504 01/18/24 1333  HGB  --   --   --  11.5*  HCT  --   --   --  35.4*  PLT  --   --   --  150  LABPROT 31.8* 30.0* 24.5*  --   INR 3.1* 2.8* 2.2*  --   CREATININE  --   --   --  8.75*   Estimated Creatinine Clearance: 5.4 mL/min (A) (by C-G formula based on SCr of 8.75 mg/dL (H)).  Assessment: 73 yo female s/p scheduled bioprosthetic mitral valve replacement with tissue valve 12/30 now with afib, pharmacy asked to dose anticoagulation with IV heparin. Spontaneously converted 1/7; flipped back to AF overnight 1/9 0300. Baseline INR 1.1, slow uptrend to 1.3 s/p 6 days warfarin therapy, total dose 20 mg, then held 1/21-24 for tunneled access plans. Patient now s/p [facility] and tunneled central line placement 1/24 AM on CRRT, plan HD MWF.  Off norepinephrine since 1/26, remains on midodrine. Resumed anticoagulation per VIR after tunneled cath placement. Heparin stopped 1/31 with INR >2.   11/15/24 UPDATE:  INR  =  2.2,  is therapeutic.    CBC stable with Hgb 11.4 and pltc 150K today. No bleeding reported. INR back in therapeutic range 2-3 after reducing dosages.   Amiodarone  is known to increase warfarin effect. Although he has been on amiodarone since [facility] acute care admit date 11/29/23, thus should have seen effect by now.  Will try coumadin 2.5 mg daily for now to target INR 2-3.     Goal of Therapy:  INR goal 2 - 3 (bMVR) Monitor platelets by anticoagulation protocol: Yes   Plan:  Coumadin 2.5 mg  daily Daily INR    Noah Delaine, RPh Clinical Pharmacist Please see AMION for all Pharmacists' Contact Phone Numbers 01/18/2024, 3:01 PM

## 2024-01-18 NOTE — Progress Notes (Signed)
Occupational Therapy Session Note  Patient Details  Name: Vanessa [name] MRN: [id number] Date of Birth: [birth year]  Today's Date: 01/18/2024 OT Individual Time: 4098-1191 OT Individual Time Calculation (min): 70 min    Short Term Goals: Week 1:  OT Short Term Goal 1 (Week 1): Pt will demonstrate improved sit to stand strength to be able to rise to stand from low w/c seat with min A. OT Short Term Goal 2 (Week 1): Pt will be able to stand for at least 3 minutes to engage in LB bathing tasks for bottom and perineal area. OT Short Term Goal 3 (Week 1): Pt will be able to don pull on pants with min A. OT Short Term Goal 4 (Week 1): Pt will complete toilet transfer and ambulation to toilet with RW with CGA.  Skilled Therapeutic Interventions/Progress Updates:  Skilled OT intervention completed with focus on ADL retraining, sit > stands, dynamic standing balance. Pt received seated in w/c, agreeable to session. No pain reported.  Pt requested to wash up since she didn't get opportunity this morning. Advised pt that she can complete bathing/dressing to get ready for day with nursing.   Seated at sink, pt was able to bathe/dress UB with set up A. Required heavy mod A on first stand with LUE on RW and RUE on thigh, then CGA for balance to lower pants down and during peri hygiene. Did not have reacher present, and pt with continued difficulty lifting BLE into pants, therefore OT demonstrated method of making the holes in shorts and placing on ground then stepping in. Pt able to do with + time but supervision and pull to thighs. Required 2 attempts to stand on 2nd trial with max A with OT utilizing towel under buttocks for leverage as pt with poor anterior weight shifting to power up. CGA to donn over hips.   Transported dependently in w/c <> gym. Max A sit > stand with 2 trials needed with prior method using RW then CGA short ambulatory transfer > EOM with RW. Seated EOM, pt worked on anterior weight  shifting to place/retrieve squigz on Doctor, hospital. Cues needed to lean head first vs just down onto elbows to translate to prepping to stand. Transitioned to completing at the standing level. Required several attempts to try and stand, however pt with fatigue and onset of mental block, stating "I can't" and on 4th trial from elevated mat with max A, pt gave up and went to sit herself back but missed the mat, requiring OT to put bended knee under bottom for seated surface. From here, pt was able to assist OT with scooting from thigh > EOM with mod A.  Retrieved slideboard then required total A to place board, then CGA transfer on board > w/c with bilateral knee block and cues for technique. Back in room, pt remained seated in w/c, with chair alarm on/activated, and with all needs in reach at end of session.   Therapy Documentation Precautions:  Precautions Precautions: Fall, Sternal Restrictions Weight Bearing Restrictions Per Provider Order: No Other Position/Activity Restrictions: sternal precautions    Therapy/Group: Individual Therapy  [name], MS, OTR/L  01/18/2024, 12:15 PM

## 2024-01-18 NOTE — Progress Notes (Signed)
[facility] NEPHROLOGY PROGRESS NOTE  Assessment/ Plan: Pt is a 73 y.o. yo female     1.  AKI on CKD 3 yea: ATN from shock.  Dialysis today and then continue dialysis on MWF schedule.  Accepted to [facility] MWF 12:10 pm chair time. Pt will need to arrive at 11:15 am for paperwork on first day. Do not know discharge date from [facility] yet  2.  Severe mitral regurgitation/tricuspid regurgitation: Status post repair with postoperative cardiac tamponade.  Additional management per cardiothoracic surgery. Stable at this time  3.  Acute exacerbation of congestive heart failure with reduced ejection fraction: Volume status significantly improved with ongoing hemodialysis/ultrafiltration.  4.  Shock: From a combination of cardiogenic and vasoplegic etiology-currently on midodrine after previously requiring pressor/inotropic support. Now on high dose midodrine  5.  Hyperphosphatemia: Phosphorus at goal. Cont binder  6. Anemia of CKD; Hb at goal.   Subjective: Patient missed dialysis yesterday because of scheduling issues.  No complaints today  Objective Vital signs in last 24 hours: Vitals:   01/17/24 1332 01/17/24 1943 01/18/24 0457 01/18/24 0500  BP: 125/74 111/65 (!) 94/56   Pulse: 82 75 74   Resp: 16 17 17    Temp: (!) 97.5 F (36.4 C) 98.1 F (36.7 C) 98.5 F (36.9 C)   TempSrc:  Oral    SpO2: 99% 95% 95%   Weight:    70.2 kg  Height:       Weight change: 2.4 kg  Intake/Output Summary (Last 24 hours) at 01/18/2024 1019 Last data filed at 01/18/2024 0721 Gross per 24 hour  Intake 250 ml  Output --  Net 250 ml       Labs: RENAL PANEL Recent Labs  Lab 01/12/24 0605 01/12/24 0719  NA  --  134*  K  --  4.4  CL  --  94*  CO2  --  24  GLUCOSE  --  154*  BUN  --  34*  CREATININE  --  5.16*  CALCIUM  --  9.2  MG 2.3  --   PHOS  --  5.0*  ALBUMIN  --  3.1*    Liver Function Tests: Recent Labs  Lab 01/12/24 0719  ALBUMIN 3.1*   No results for input(s):  "LIPASE", "AMYLASE" in the last 168 hours. No results for input(s): "AMMONIA" in the last 168 hours. CBC: Recent Labs    01/08/24 0640 01/09/24 0350 01/10/24 0500 01/12/24 0605 01/15/24 1304  HGB 11.8* 11.4* 11.1* 11.8* 12.4  MCV 100.3* 98.9 98.0 97.9 96.1    Cardiac Enzymes: No results for input(s): "CKTOTAL", "CKMB", "CKMBINDEX", "TROPONINI" in the last 168 hours. CBG: Recent Labs  Lab 01/17/24 0555 01/17/24 1204 01/17/24 1645 01/17/24 2116 01/18/24 0604  GLUCAP 119* 112* 102* 114* 108*    Iron Studies: No results for input(s): "IRON", "TIBC", "TRANSFERRIN", "FERRITIN" in the last 72 hours. Studies/Results: No results found.   Medications: Infusions:  anticoagulant sodium citrate      Scheduled Medications:  amiodarone  200 mg Oral Daily   ascorbic acid  250 mg Oral BID   Chlorhexidine Gluconate Cloth  6 each Topical Q12H   cholecalciferol  1,000 Units Oral Daily   docusate sodium  100 mg Oral Daily   Gerhardt's butt cream   Topical BID   lanthanum  500 mg Oral TID WC   midodrine  20 mg Oral TID WC   mirtazapine  7.5 mg Oral QHS   multivitamin  1 tablet Oral BID  polyethylene glycol  17 g Oral Daily   QUEtiapine  25 mg Oral QHS   senna-docusate  2 tablet Oral BID   sertraline  25 mg Oral Daily   sodium chloride flush  10-40 mL Intracatheter Q12H   Warfarin - Pharmacist Dosing Inpatient   Does not apply q1600    have reviewed scheduled and prn medications.  Physical Exam: General:NAD, comfortable Heart:normal rate, no rub Lungs: Bilateral chest rise with no increased work of breathing Abdomen:soft, Non-tender, non-distended Extremities: Trace edema at the bilateral ankles, warm and well-perfused Dialysis Access: Right IJ TDC in place  [facility] 01/18/2024,10:19 AM  LOS: 6 days

## 2024-01-18 NOTE — Progress Notes (Signed)
Received patient in bed to unit.  Alert and oriented.  Informed consent signed and in chart.   TX duration: 3 hours and 30 minutes  Patient tolerated well.  Transported back to the room  Alert, without acute distress.  Hand-off given to patient's nurse.   Access used: Right internal jugular HD Cath Access issues: none  Total UF removed: 2L Medication(s) given: none   01/18/24 1717  Vitals  Temp 97.6 F (36.4 C)  BP (!) 141/102  Pulse Rate 84  Resp (!) 24  Oxygen Therapy  SpO2 98 %  O2 Device Room Air  Patient Activity (if Appropriate) In bed  Pulse Oximetry Type Continuous  During Treatment Monitoring  Duration of HD Treatment -hour(s) 3.5 hour(s)  Cumulative Fluid Removed (mL) per Treatment  2000  HD Safety Checks Performed Yes  Intra-Hemodialysis Comments Tx completed  Dialysis Fluid Bolus Normal Saline  Bolus Amount (mL) 300 mL  Post Treatment  Dialyzer Clearance Clear  Liters Processed 63  Fluid Removed (mL) 2000 mL  Tolerated HD Treatment Yes  Hemodialysis Catheter Right Internal jugular Double lumen Permanent (Tunneled)  Placement Date/Time: 12/24/23 0921   Serial / Lot #: [id number]  Expiration Date: 02/28/28  Time Out: Correct patient;Correct site;Correct procedure  Maximum sterile barrier precautions: Hand hygiene;Cap;Mask;Sterile gown;Sterile gloves;Large sterile s...  Site Condition No complications  Blue Lumen Status Flushed;Heparin locked;Dead end cap in place  Red Lumen Status Flushed;Heparin locked;Dead end cap in place  Purple Lumen Status N/A  Catheter fill solution Heparin 1000 units/ml  Catheter fill volume (Arterial) 1.6 cc  Catheter fill volume (Venous) 1.6  Dressing Type Transparent  Dressing Status Antimicrobial disc/dressing in place;Clean, Dry, Intact  Interventions Other (Comment) (deaccessed)  Drainage Description None  Dressing Change Due 01/24/24  Post treatment catheter status Capped and Clamped     Stacie Glaze LPN [facility]  Dialysis Unit

## 2024-01-19 LAB — GLUCOSE, CAPILLARY
Glucose-Capillary: 118 mg/dL — ABNORMAL HIGH (ref 70–99)
Glucose-Capillary: 116 mg/dL — ABNORMAL HIGH (ref 70–99)
Glucose-Capillary: 192 mg/dL — ABNORMAL HIGH (ref 70–99)

## 2024-01-19 LAB — PROTIME-INR
INR: 1.9 — ABNORMAL HIGH (ref 0.8–1.2)
Prothrombin Time: 22 s — ABNORMAL HIGH (ref 11.4–15.2)

## 2024-01-19 MED ORDER — QUETIAPINE FUMARATE 25 MG PO TABS
12.5000 mg | ORAL_TABLET | Freq: Every day | ORAL | Status: DC
  Administered 2024-01-19: 12.5 mg via ORAL
  Filled 2024-01-19: qty 1

## 2024-01-19 MED ORDER — HEPARIN SODIUM (PORCINE) 1000 UNIT/ML IJ SOLN
INTRAMUSCULAR | Status: AC
  Administered 2024-01-19: 3200 [IU]
  Filled 2024-01-19: qty 4

## 2024-01-19 MED ORDER — WARFARIN SODIUM 2.5 MG PO TABS
2.5000 mg | ORAL_TABLET | Freq: Every day | ORAL | Status: DC
  Administered 2024-01-19: 2.5 mg via ORAL
  Filled 2024-01-19: qty 1

## 2024-01-19 MED ORDER — HEPARIN SODIUM (PORCINE) 1000 UNIT/ML IJ SOLN: 3200.0000 [IU] | Freq: Once | INTRAMUSCULAR | Status: AC

## 2024-01-19 NOTE — Progress Notes (Signed)
[facility] NEPHROLOGY PROGRESS NOTE  Assessment/ Plan: Pt is a 73 y.o. yo female     1.  AKI on CKD 3 yea: ATN from shock.  Maintain dialysis on MWF schedule.  Accepted to [facility] MWF 12:10 pm chair time. Pt will need to arrive at 11:15 am for paperwork on first day. Do not know discharge date from [facility] yet  2.  Severe mitral regurgitation/tricuspid regurgitation: Status post repair with postoperative cardiac tamponade.  Additional management per cardiothoracic surgery. Stable at this time  3.  Acute exacerbation of congestive heart failure with reduced ejection fraction: Volume status significantly improved with ongoing hemodialysis/ultrafiltration.  4.  Shock: From a combination of cardiogenic and vasoplegic etiology-currently on midodrine after previously requiring pressor/inotropic support. Now on high dose midodrine  5.  Hyperphosphatemia: Phosphorus at goal. Cont binder  6. Anemia of CKD; Hb at goal.   Subjective: Patient feels well with no complaints  Objective Vital signs in last 24 hours: Vitals:   01/18/24 1828 01/18/24 1943 01/18/24 2330 01/19/24 0500  BP: (!) 141/84 94/67 95/63  99/62  Pulse: 91 95 84 78  Resp: 18 18 18 18   Temp: 98 F (36.7 C) 99.1 F (37.3 C) 98.6 F (37 C) 98.1 F (36.7 C)  TempSrc:   Oral Oral  SpO2: 98% 96%    Weight:    68.2 kg  Height:       Weight change: -2.3 kg  Intake/Output Summary (Last 24 hours) at 01/19/2024 1050 Last data filed at 01/19/2024 0827 Gross per 24 hour  Intake 160 ml  Output 2002 ml  Net -1842 ml       Labs: RENAL PANEL Recent Labs  Lab 01/18/24 1333  NA 128*  K 4.1  CL 88*  CO2 21*  GLUCOSE 151*  BUN 55*  CREATININE 8.75*  CALCIUM 9.2  PHOS 3.6  ALBUMIN 3.0*    Liver Function Tests: Recent Labs  Lab 01/18/24 1333  ALBUMIN 3.0*   No results for input(s): "LIPASE", "AMYLASE" in the last 168 hours. No results for input(s): "AMMONIA" in the last 168 hours. CBC: Recent Labs     01/09/24 0350 01/10/24 0500 01/12/24 0605 01/15/24 1304 01/18/24 1333  HGB 11.4* 11.1* 11.8* 12.4 11.5*  MCV 98.9 98.0 97.9 96.1 95.2    Cardiac Enzymes: No results for input(s): "CKTOTAL", "CKMB", "CKMBINDEX", "TROPONINI" in the last 168 hours. CBG: Recent Labs  Lab 01/18/24 0604 01/18/24 1117 01/18/24 2052 01/18/24 2334 01/19/24 0623  GLUCAP 108* 122* 557* 130* 118*    Iron Studies: No results for input(s): "IRON", "TIBC", "TRANSFERRIN", "FERRITIN" in the last 72 hours. Studies/Results: No results found.   Medications: Infusions:  anticoagulant sodium citrate      Scheduled Medications:  amiodarone  200 mg Oral Daily   ascorbic acid  250 mg Oral BID   Chlorhexidine Gluconate Cloth  6 each Topical Q12H   cholecalciferol  1,000 Units Oral Daily   docusate sodium  100 mg Oral Daily   Gerhardt's butt cream   Topical BID   lanthanum  500 mg Oral TID WC   midodrine  20 mg Oral TID WC   mirtazapine  7.5 mg Oral QHS   multivitamin  1 tablet Oral BID   polyethylene glycol  17 g Oral Daily   QUEtiapine  12.5 mg Oral QHS   senna-docusate  2 tablet Oral BID   sertraline  25 mg Oral Daily   sodium chloride flush  10-40 mL Intracatheter Q12H  Warfarin - Pharmacist Dosing Inpatient   Does not apply q1600    have reviewed scheduled and prn medications.  Physical Exam: General:NAD, comfortable Heart:normal rate, no rub Lungs: Bilateral chest rise with no increased work of breathing Abdomen:soft, Non-tender, non-distended Extremities: Trace edema at the bilateral ankles, warm and well-perfused Dialysis Access: Right IJ TDC in place  [facility] 01/19/2024,10:50 AM  LOS: 7 days

## 2024-01-19 NOTE — Progress Notes (Addendum)
Patient ID: Vanessa [name], female   DOB: 1951/05/23, 73 y.o.   MRN: [id number]  Have found home health agency to accept pt's referral-[facility] will provide follow up PT & OT at discharge.  1:06 PM Met with pt to discuss team conference with goals of supervision level goals with discharge date 3/1. She will let sister now and see if will be here to go through therapies with her prior to going home. Her OP-HD will be M, W, F. So this will work. Will work on discharge needs, pt has no plans to get a rental hospital bed for home. Will await therapy recommendations for equipment and pt will continue to work on sit to stands.

## 2024-01-19 NOTE — Progress Notes (Signed)
Occupational Therapy Weekly Progress Note  Patient Details  Name: Vanessa [name] MRN: [id number] Date of Birth: [birth year]  Beginning of progress report period: January 13, 2024 End of progress report period: January 19, 2024   Patient has met 3 of 4 short term goals. Pt is making gradual progress towards LTGs. She is able to bathe at an overall min A level, dress with min A using AE for LB, and CGA/min A for toileting tasks. Pt continues to demonstrate global deconditioning, difficulty with standing from low-mid level surfaces, difficulty maintaining sternal precautions when fatigue is present, and GM and functional endurance deficits resulting in difficulty completing BADL tasks without increased physical assist. Pt will benefit from continued skilled OT services to focus on mentioned deficits. Family ed not initiated as of date and will be needed in prep for DC.   Patient continues to demonstrate the following deficits: muscle weakness, decreased cardiorespiratoy endurance, decreased coordination, decreased problem solving, and decreased standing balance and difficulty maintaining precautions and therefore will continue to benefit from skilled OT intervention to enhance overall performance with BADL and Reduce care partner burden.  Patient progressing toward long term goals..  Continue plan of care.  OT Short Term Goals Week 1:  OT Short Term Goal 1 (Week 1): Pt will demonstrate improved sit to stand strength to be able to rise to stand from low w/c seat with min A. OT Short Term Goal 1 - Progress (Week 1): Progressing toward goal OT Short Term Goal 2 (Week 1): Pt will be able to stand for at least 3 minutes to engage in LB bathing tasks for bottom and perineal area. OT Short Term Goal 2 - Progress (Week 1): Met OT Short Term Goal 3 (Week 1): Pt will be able to don pull on pants with min A. OT Short Term Goal 3 - Progress (Week 1): Met OT Short Term Goal 4 (Week 1): Pt will complete  toilet transfer and ambulation to toilet with RW with CGA. OT Short Term Goal 4 - Progress (Week 1): Met Week 2:  OT Short Term Goal 1 (Week 2): STG = LTG due to ELOS    Sabien Umland E Dickie Labarre, MS, OTR/L  01/19/2024, 7:46 AM

## 2024-01-19 NOTE — Progress Notes (Signed)
   01/19/24 1707  Vitals  Temp 97.7 F (36.5 C)  Pulse Rate 95  Resp (!) 21  BP 98/73  SpO2 98 %  O2 Device Room Air  Weight 65.3 kg  Type of Weight Post-Dialysis  Oxygen Therapy  Patient Activity (if Appropriate) In bed  Pulse Oximetry Type Continuous  During Treatment Monitoring  HD Safety Checks Performed Yes   Received patient in bed to unit.  Alert and oriented.  Informed consent signed and in chart.   TX duration: 3 hours  Patient tolerated well.  Transported back to the room  Alert, without acute distress.  Hand-off given to patient's nurse.   Access used: Permanent Catheter Access issues: None  Total UF removed: 1500 Medication(s) given: None    Mar Daring, LPN  [facility] D[facility] Unit

## 2024-01-19 NOTE — Progress Notes (Signed)
PROGRESS NOTE   Subjective/Complaints: No new complaints this morning. Asks when left central line can be removed, placed order for removal and messaged nephrology  ROS:   ROS: Denies fevers, chills,   abdominal pain, SOB, chest pain, new weakness or paraesthesias.   + Nausea/vomiting-intermittent  Objective:   No results found.  Recent Labs    01/18/24 1333  WBC 9.5  HGB 11.5*  HCT 35.4*  PLT 150    Recent Labs    01/18/24 1333  NA 128*  K 4.1  CL 88*  CO2 21*  GLUCOSE 151*  BUN 55*  CREATININE 8.75*  CALCIUM 9.2     Intake/Output Summary (Last 24 hours) at 01/19/2024 1610 Last data filed at 01/19/2024 0827 Gross per 24 hour  Intake 160 ml  Output 2002 ml  Net -1842 ml     Pressure Injury 01/12/24 Buttocks Left Deep Tissue Pressure Injury - Purple or maroon localized area of discolored intact skin or blood-filled blister due to damage of underlying soft tissue from pressure and/or shear. Purple DTI 2cm round (Active)  01/12/24 1830  Location: Buttocks  Location Orientation: Left  Staging: Deep Tissue Pressure Injury - Purple or maroon localized area of discolored intact skin or blood-filled blister due to damage of underlying soft tissue from pressure and/or shear.  Wound Description (Comments): Purple DTI 2cm round  Present on Admission: Yes    Physical Exam: Vital Signs Blood pressure 99/62, pulse 78, temperature 98.1 F (36.7 C), temperature source Oral, resp. rate 18, height 5\' 3"  (1.6 m), weight 68.2 kg, SpO2 96%. Gen: no distress, normal appearing.  Sitting upright in bedside chair.   HEENT: oral mucosa pink and moist, NCAT Cardio: Reg rate and rhythm Chest: normal effort, normal rate of breathing Abd: soft, normal bowel sounds, non-distended but protuberant Ext: no edema Psych: pleasant, flat affect Skin: intact; right chest dialysis port C/D/I Neuro: No focal neurologic deficits.   Awake, alert and oriented x 4.  Moving all 4 extremities antigravity and against resistance.  Touch intact.  Reflexes normal.  No tone, stable 2/19   Assessment/Plan: 1. Functional deficits which require 3+ hours per day of interdisciplinary therapy in a comprehensive inpatient rehab setting. Physiatrist is providing close team supervision and 24 hour management of active medical problems listed below. Physiatrist and rehab team continue to assess barriers to discharge/monitor patient progress toward functional and medical goals  Care Tool:  Bathing    Body parts bathed by patient: Right arm, Left arm, Chest, Abdomen, Front perineal area, Right upper leg, Left upper leg, Face, Right lower leg, Left lower leg   Body parts bathed by helper: Left lower leg, Right lower leg, Buttocks     Bathing assist Assist Level: Minimal Assistance - Patient > 75%     Upper Body Dressing/Undressing Upper body dressing   What is the patient wearing?: Pull over shirt    Upper body assist Assist Level: Supervision/Verbal cueing    Lower Body Dressing/Undressing Lower body dressing      What is the patient wearing?: Pants     Lower body assist Assist for lower body dressing: Contact Guard/Touching assist     Toileting  Toileting    Toileting assist Assist for toileting: Maximal Assistance - Patient 25 - 49%     Transfers Chair/bed transfer  Transfers assist     Chair/bed transfer assist level: Contact Guard/Touching assist     Locomotion Ambulation   Ambulation assist      Assist level: Minimal Assistance - Patient > 75% Assistive device: Walker-rolling Max distance: 152ft   Walk 10 feet activity   Assist     Assist level: Minimal Assistance - Patient > 75% Assistive device: Walker-rolling   Walk 50 feet activity   Assist Walk 50 feet with 2 turns activity did not occur: Safety/medical concerns (fatigue)  Assist level: Minimal Assistance - Patient >  75% Assistive device: Walker-rolling    Walk 150 feet activity   Assist Walk 150 feet activity did not occur: Safety/medical concerns (fatigue)         Walk 10 feet on uneven surface  activity   Assist Walk 10 feet on uneven surfaces activity did not occur: Safety/medical concerns (fatigue)         Wheelchair     Assist Is the patient using a wheelchair?: Yes Type of Wheelchair: Manual    Wheelchair assist level: Dependent - Patient 0% Max wheelchair distance: 32ft    Wheelchair 50 feet with 2 turns activity    Assist        Assist Level: Dependent - Patient 0%   Wheelchair 150 feet activity     Assist      Assist Level: Dependent - Patient 0%   Blood pressure 99/62, pulse 78, temperature 98.1 F (36.7 C), temperature source Oral, resp. rate 18, height 5\' 3"  (1.6 m), weight 68.2 kg, SpO2 96%.  Medical Problem List and Plan: 1. Functional deficits secondary cardiab debility complicated by AKI             -patient may shower             -ELOS/Goals: 8-11 days S             -Continue CIR  Messaged team to set d/c date  Placed order for central line removal  2.  Antithrombotics: -DVT/anticoagulation:  Pharmaceutical: continue Coumadin, INR reviewed and stable             -antiplatelet therapy:   3. Pain Management: denies pain. Tylenol prn mild pain, d/c oxycodone  4. Mood/Behavior/Sleep: Team to provide ego support. LCSW to follow for evaluation and support.              -Decrease Seroquel to 12.5mg  HS, continue low dose Remeron at nights. Melatonin prn added.              -antipsychotic agents: Seroquel daily at bedtime.  5. Neuropsych/cognition: This patient is capable of making decisions on her own behalf. 6. Skin/Wound Care: routine pressure relief measures. Monitor wound for healing  7. Fluids/Electrolytes/Nutrition:  Monitor I/O. Not on FR at this time             --Renal restrictions added to diet. Changed ensure to nephro              --continue vitamin C and protein supplement  8. Afib/A flutter: Monitor HR TID and for symptoms with activity.  --continue Amiodarone 200 mg daily with coumadin. -Heart rate well-controlled  9. Hypotension: TEDs. Midodrine 20 mg TID. KHT for support. Add fludricortisone 0.5mg  daily, continue, improved   -Blood pressure remains stable, mildly hypotensive  10. Acute on CKD: Now on  HD MWF due to ischemic ATN/shock. Schedule HD at the end of the day to help with tolerance of therapy             --continue Phoslo for now--should improve with renal restrictions.   Continue fluid restriction    11. Acute on chronic anemia: continue Aranesp weekly    12. HFrEF: Strict I/O. Daily weights. Fluid status being managed with HD.   -Weights approximately stable around dialysis [facility] Weights   01/18/24 1324 01/18/24 1728 01/19/24 0500  Weight: 67.9 kg 65.9 kg 68.2 kg    22. Depression: continue Zoloft, Seroquel and Remeron. Provide ego support.    23. Constipation: Has finally had BM after enema/miralax. D/c enema. Add daily colace  -2-15 last bowel movement, large  24. Fatigue: improved, will d/c fludricortisone  25. Type 2 diabetes: d/c feeding supplement  Improved, decrease CBG checks to BID  26.  Nausea/vomiting.  resolved  LOS: 7 days A FACE TO FACE EVALUATION WAS PERFORMED  Vanessa [name] P Vanessa [name] 01/19/2024, 9:22 AM

## 2024-01-19 NOTE — Plan of Care (Signed)
  Problem: Consults Goal: RH GENERAL PATIENT EDUCATION Description: See Patient Education module for education specifics. Outcome: Progressing   Problem: RH BOWEL ELIMINATION Goal: RH STG MANAGE BOWEL WITH ASSISTANCE Description: STG Manage Bowel with toileting Assistance. Outcome: Progressing Goal: RH STG MANAGE BOWEL W/MEDICATION W/ASSISTANCE Description: STG Manage Bowel with Medication with mod I Assistance. Outcome: Progressing   Problem: RH SKIN INTEGRITY Goal: RH STG SKIN FREE OF INFECTION/BREAKDOWN Description: Manage skin w min assist Outcome: Progressing Goal: RH STG MAINTAIN SKIN INTEGRITY WITH ASSISTANCE Description: STG Maintain Skin Integrity With min  Assistance. Outcome: Progressing Goal: RH STG ABLE TO PERFORM INCISION/WOUND CARE W/ASSISTANCE Description: STG Able To Perform Incision/Wound Care With min Assistance. Outcome: Progressing   Problem: RH SAFETY Goal: RH STG ADHERE TO SAFETY PRECAUTIONS W/ASSISTANCE/DEVICE Description: STG Adhere to Safety Precautions With cues Assistance/Device. Outcome: Progressing   Problem: RH KNOWLEDGE DEFICIT GENERAL Goal: RH STG INCREASE KNOWLEDGE OF SELF CARE AFTER HOSPITALIZATION Description: Patient and family will be able to manage care at discharge using educational resources for medication and dietary modification independently Outcome: Progressing

## 2024-01-19 NOTE — Consult Note (Signed)
  01/18/2024: I attempted to see the patient today but she had been transferred/gone to hemodialysis as there have been a late change to her dialysis schedule.  I will try to go back and see her later this afternoon if possible.

## 2024-01-19 NOTE — Progress Notes (Signed)
Occupational Therapy Session Note  Patient Details  Name: Vanessa [name] MRN: [id number] Date of Birth: Jan 30, 1951  Today's Date: 01/19/2024 OT Individual Time: 1000-1055 OT Individual Time Calculation (min): 55 min    Short Term Goals: Week 1:  OT Short Term Goal 1 (Week 1): Pt will demonstrate improved sit to stand strength to be able to rise to stand from low w/c seat with min A. OT Short Term Goal 2 (Week 1): Pt will be able to stand for at least 3 minutes to engage in LB bathing tasks for bottom and perineal area. OT Short Term Goal 3 (Week 1): Pt will be able to don pull on pants with min A. OT Short Term Goal 4 (Week 1): Pt will complete toilet transfer and ambulation to toilet with RW with CGA.  Skilled Therapeutic Interventions/Progress Updates:  Skilled OT intervention completed with focus on blocked practice sit <> stands and standing tolerance. Pt received seated in w/c with pants down at knees, agreeable to session. No pain reported.  Pt verbalized she bathed with nursing but was unable to stand due to BLE weakness. Due to fatigue and pt mindset, OT retrieved sara + and educated pt on purpose, to assist pt with the powering up (unable to use Stedy due to sternal precautions) from low surfaces. Completed x8 stands in sara +, dependently. Worked on standing tolerance and increasing her duration each trial, as pt c/o fatigue and request to sit after 30 seconds. Education provided on purpose of increasing standing tolerance for BADL needs in standing such as toileting. Pt able to increase standing tolerance with distraction of music to 1 min durations with increased trials. Extended rest needed between trials due to BLE weakness/trembling. Cues needed throughout for pt to engage BLE, anterior weight shift and tighten core to prep the stand and let the machine assist her vs dependently lifting her. We discussed about modifying her surfaces at home including toilet via BSC to minimize risk  of getting stuck on low surfaces.  Pt remained seated in w/c, with chair alarm on/activated, and with all needs in reach at end of session.   Therapy Documentation Precautions:  Precautions Precautions: Fall, Sternal Restrictions Weight Bearing Restrictions Per Provider Order: No Other Position/Activity Restrictions: sternal precautions    Therapy/Group: Individual Therapy  [name], MS, OTR/L  01/19/2024, 11:20 AM

## 2024-01-19 NOTE — Progress Notes (Addendum)
Physical Therapy Session Note  Patient Details  Name: Vanessa [name] MRN: [id number] Date of Birth: [birth year]  Today's Date: 01/19/2024 PT Individual Time: 4098-1191; 1108 - 1158 PT Individual Time Calculation (min): 72 min; 50 min   Short Term Goals: Week 1:  PT Short Term Goal 1 (Week 1): pt will transfer supine<>sitting EOB with CGA PT Short Term Goal 2 (Week 1): pt will transfer sit<>stand with LRAD and min A consistantly PT Short Term Goal 3 (Week 1): pt will ambulate 71ft with LRAD and CGA  SESSION 1 Skilled Therapeutic Interventions/Progress Updates: Patient supine in bed on entrance to room. Patient alert and agreeable to PT session.   Patient reported no pain or nausea, but requested morning medication from nsg as pt has not received it yet this morning. Nsg alerted.   Therapeutic Activity: Bed Mobility: Pt performed supine<sit on EOB with supervision (HOB elevated. Pt required increased time to scoot to EOB. PTA provided slick bag under pt B hips/HS to decrease friction with pt scooting to edge much easier Transfers: Pt performed sit<>stand transfers throughout session with RW and with mod/heavy modA to stand (provided slick bag under hips/HS to assist with scoot closer to edge of WC).   Therapeutic Exercise: Pt performed the following exercises with therapist providing the described cuing and facilitation for improvement. - B LAQ with 4lb ankle weights donned. 2 x 10 with VC to hold isometrically in extension for 3 seconds and to control eccentric - B hip flexion with passive movement into flexion, and VC to hold as long as able and control eccentric. Pt required modA to control eccentric. 2 x 5 - Standing while playing "WordScapes" on table with B UE support to increase WB tolerance in B LE's. 3 rounds performed with pt requiring rest breaks. Pt required cooling by use of fan to decreased increased temperature internally as to avoid emetic event throughout (mild reports of  feeling "hot" per pt report).   Patient sitting in WC at end of session with brakes locked, chair alarm set, and all needs within reach.  SESSION 2 Skilled Therapeutic Interventions/Progress Updates: Patient sitting in WC on entrance to room. Patient alert and agreeable to PT session.   Patient with no complaints of pain. Pt reported increased fatigue and willing to do as much as able. Pt required increased rest breaks throughout session.   Therapeutic Activity: Bed Mobility: Pt performed sit<supine from EOB with maxA due to nauseas presentation. Transfers: Pt performed sit<>stand transfer from WC<EOB with modA to stand, and supervision to pivot and sit. Pt provided with slick bag under B LE's to assist with anterior scoot.  Therapeutic Exercise: Pt performed the following exercises with therapist providing the described cuing and facilitation for improvement. - Kinetron 10 cm/sec x 4 min - rest break - 2 min 40s - pt required rest break - continued 1 min 20s - rest break - 2 min - pt required rest break - final 1 min 20s and pt reported increased fatigue in B LE's. VC required for pt to maintain full ROM throughout.  - Attempted game sitting in WC with bocce balls but pt presented with increased nausea and transported back to room.  Patient supine in bed at end of session with brakes locked, nsg present, bed alarm set, and all needs within reach.       Therapy Documentation Precautions:  Precautions Precautions: Fall, Sternal Restrictions Weight Bearing Restrictions Per Provider Order: No LUE Weight Bearing Per Provider Order: Non weight  bearing Other Position/Activity Restrictions: sternal precautions  Therapy/Group: Individual Therapy  Irma Delancey PTA 01/19/2024, 12:09 PM

## 2024-01-19 NOTE — Progress Notes (Signed)
PHARMACY - ANTICOAGULATION CONSULT NOTE  Pharmacy Consult for Warfarin Indication: atrial fibrillation  Allergies  Allergen Reactions   Spironolactone     hyperkalemia    Patient Measurements: Height: 5\' 3"  (160 cm) Weight: 68.2 kg (150 lb 5.7 oz) IBW/kg (Calculated) : 52.4 Heparin Dosing Weight: ~ 70 kg  Vital Signs: Temp: 98.1 F (36.7 C) (02/19 0500) Temp Source: Oral (02/19 0500) BP: 99/62 (02/19 0500) Pulse Rate: 78 (02/19 0500)  Labs: Recent Labs    01/17/24 0627 01/18/24 0504 01/18/24 1333 01/19/24 0451  HGB  --   --  11.5*  --   HCT  --   --  35.4*  --   PLT  --   --  150  --   LABPROT 30.0* 24.5*  --  22.0*  INR 2.8* 2.2*  --  1.9*  CREATININE  --   --  8.75*  --    Estimated Creatinine Clearance: 5.4 mL/min (A) (by C-G formula based on SCr of 8.75 mg/dL (H)).  Assessment: 73 yo female s/p scheduled bioprosthetic mitral valve replacement with tissue valve 12/30 now with afib, pharmacy asked to dose anticoagulation with IV heparin. Spontaneously converted 1/7; flipped back to AF overnight 1/9 0300. Baseline INR 1.1, slow uptrend to 1.3 s/p 6 days warfarin therapy, total dose 20 mg, then held 1/21-24 for tunneled access plans. Patient now s/p [facility] and tunneled central line placement 1/24 AM on CRRT, plan HD MWF.  Off norepinephrine since 1/26, remains on midodrine. Resumed anticoagulation per VIR after tunneled cath placement. Heparin stopped 1/31 with INR >2.   11/15/24 UPDATE:  INR  =  INR drop to 1.9 subtherapeutic likely due to reduce doses down to 1mg  on 2/16 , I anticipate that INR will trend back to goal on 2.5 mg daily, goal INR 2-3   -  Amiodarone  is known to increase warfarin effect. Although he has been on amiodarone since [facility] acute care admit date 11/29/23, thus should have seen effect by now.     Goal of Therapy:  INR goal 2 - 3 (bMVR) Monitor platelets by anticoagulation protocol: Yes   Plan:  Coumadin 2.5 mg daily Daily INR    Noah Delaine, RPh Clinical Pharmacist Please see AMION for all Pharmacists' Contact Phone Numbers 01/19/2024, 1:23 PM

## 2024-01-20 ENCOUNTER — Inpatient Hospital Stay (HOSPITAL_COMMUNITY): Payer: PPO

## 2024-01-20 HISTORY — PX: IR REMOVAL TUN CV CATH W/O FL: IMG2289

## 2024-01-20 LAB — PROTIME-INR
INR: 1.7 — ABNORMAL HIGH (ref 0.8–1.2)
Prothrombin Time: 20.3 s — ABNORMAL HIGH (ref 11.4–15.2)

## 2024-01-20 LAB — GLUCOSE, CAPILLARY
Glucose-Capillary: 124 mg/dL — ABNORMAL HIGH (ref 70–99)
Glucose-Capillary: 129 mg/dL — ABNORMAL HIGH (ref 70–99)
Glucose-Capillary: 73 mg/dL (ref 70–99)
Glucose-Capillary: 126 mg/dL — ABNORMAL HIGH (ref 70–99)

## 2024-01-20 MED ORDER — WARFARIN SODIUM 4 MG PO TABS
4.0000 mg | ORAL_TABLET | Freq: Once | ORAL | Status: AC
  Administered 2024-01-20: 4 mg via ORAL
  Filled 2024-01-20: qty 1

## 2024-01-20 NOTE — Progress Notes (Signed)
PHARMACY - ANTICOAGULATION CONSULT NOTE  Pharmacy Consult for Warfarin Indication: atrial fibrillation  Allergies  Allergen Reactions   Spironolactone     hyperkalemia    Patient Measurements: Height: 5\' 3"  (160 cm) Weight: 66.7 kg (147 lb 0.8 oz) IBW/kg (Calculated) : 52.4 Heparin Dosing Weight: ~ 70 kg  Vital Signs: Temp: 97.8 F (36.6 C) (02/20 0602) BP: 104/64 (02/20 0602) Pulse Rate: 85 (02/20 0602)  Labs: Recent Labs    01/18/24 0504 01/18/24 1333 01/19/24 0451 01/20/24 0448  HGB  --  11.5*  --   --   HCT  --  35.4*  --   --   PLT  --  150  --   --   LABPROT 24.5*  --  22.0* 20.3*  INR 2.2*  --  1.9* 1.7*  CREATININE  --  8.75*  --   --    Estimated Creatinine Clearance: 5.3 mL/min (A) (by C-G formula based on SCr of 8.75 mg/dL (H)).  Assessment: 73 yo female s/p scheduled bioprosthetic mitral valve replacement with tissue valve 12/30 now with afib, pharmacy asked to dose anticoagulation with IV heparin. Spontaneously converted 1/7; flipped back to AF overnight 1/9 0300. Baseline INR 1.1, slow uptrend to 1.3 s/p 6 days warfarin therapy, total dose 20 mg, then held 1/21-24 for tunneled access plans. Patient now s/p [facility] and tunneled central line placement 1/24 AM on CRRT, plan HD MWF.  Off norepinephrine since 1/26, remains on midodrine. Resumed anticoagulation per VIR after tunneled cath placement. Heparin stopped 1/31 with INR >2.  Amiodarone  is known to increase warfarin effect. Although he has been on amiodarone since [facility] acute care admit date 11/29/23, thus should have seen effect by now.    UPDATE:  INR dropped to 1.7 subtherapeutic likely due to reduce doses down to 1mg  on 2/16.  Eating about 25-75% of some meals charted in last 3 days.  Amount of the decline of the protime/INR is tapering down, thus I anticipate that INR to trend up to goal INR 2-3, with the recent higher doses given.   CBC stable with hgb 11.5, pltc 150k, last checked on 2/18. No bleeding  reported.   I will order 4mg  x1 today and anticipating that patient needs 2.5 to 3mg  daily.    Wiil check INR in AM before ordering a daily dose.   Goal of Therapy:  INR goal 2 - 3 (bMVR) Monitor platelets by anticoagulation protocol: Yes   Plan:  Coumadin 4mg  x1 dose today.   Daily INR    Noah Delaine, RPh Clinical Pharmacist Please see AMION for all Pharmacists' Contact Phone Numbers 01/20/2024, 7:53 AM

## 2024-01-20 NOTE — Progress Notes (Signed)
Physical Therapy Session Note  Patient Details  Name: Vanessa [name] MRN: [id number] Date of Birth: 1951-06-19  Today's Date: 01/20/2024 PT Individual Time: 7829-5621 PT Individual Time Calculation (min): 40 min   Short Term Goals: Week 1:  PT Short Term Goal 1 (Week 1): pt will transfer supine<>sitting EOB with CGA PT Short Term Goal 2 (Week 1): pt will transfer sit<>stand with LRAD and min A consistantly PT Short Term Goal 3 (Week 1): pt will ambulate 73ft with LRAD and CGA  Skilled Therapeutic Interventions/Progress Updates:    Session focused on sit <> stand retraining, transfers with RW, and functional sitting balance activity. Pt initially struggling with sit > stand from w/c despite multiple attempts and trial of techniques and hand placement (was initially attempting SUV transfer again). Ultimately worked on blocked practice with focus on transitional movement, anterior weightshift, and head and eyes up to promote upright extension and progressed to overall min to mod assist several reps. Gait training with RW x 30' x 2 with min assist overall with cues for upright posture and gaze upright. Limited by fatigue overall in session. Pt states she is done with standing/walking at this point. Engaged in seated functional reaching task to place and remove suction cups from mirror in front. Instructed in seated neck ROM stretching exercises due to pt reporting feeling like her head/neck gets stiff from keeping it downwards. Return demonstrated back in room these exercises and pt reporting some relief. Transferred back to recliner with RW with ambulatory transfer with overall min assist. Then transport arrived and request for pt to be back in bed for procedure. Transferred with mod assist for sit > stand and then CGA for transfer with RW. Mod assist to return to supine with BLE management assist needed. Hand off to transport.   Therapy Documentation Precautions:  Precautions Precautions: Fall,  Sternal Restrictions Weight Bearing Restrictions Per Provider Order: No LUE Weight Bearing Per Provider Order: Non weight bearing Other Position/Activity Restrictions: sternal precautions    Pain:  Does not report pain. States nausea has been better this PM.    Therapy/Group: Individual Therapy  Karolee Stamps Darrol Poke, PT, DPT, CBIS  01/20/2024, 2:20 PM

## 2024-01-20 NOTE — Progress Notes (Addendum)
Patient ID: Vanessa [name], female   DOB: 07/13/1951, 73 y.o.   MRN: [id number]  Attempted to call Reggie Wright-pt's son who called the desk and has questions regarding follow up. No voice mail and was disconnected will try again later.  11:48 Am Reggie called back he and wife were on the phone to ask questions regarding pt's level of care and care needs at discharge. Aware will ned 24/7 supervision at discharge at least for a couple weeks and then see if mod/I level. Discussed SNF and she is too high level for this and they would no cover due to came to CIR. Discussed private duty aides or ALF and this is private pay. Aware can call with further questions. Reggie is pt;s nephew and lives in [place] and can not be here to assist.

## 2024-01-20 NOTE — Progress Notes (Signed)
PROGRESS NOTE   Subjective/Complaints: No new complaints this morning Central line was unable to removed yesterday Working with therapy  ROS:   ROS: Denies fevers, chills,   abdominal pain, SOB, chest pain, new weakness or paraesthesias.   + Nausea/vomiting-intermittent  Objective:   No results found.  Recent Labs    01/18/24 1333  WBC 9.5  HGB 11.5*  HCT 35.4*  PLT 150    Recent Labs    01/18/24 1333  NA 128*  K 4.1  CL 88*  CO2 21*  GLUCOSE 151*  BUN 55*  CREATININE 8.75*  CALCIUM 9.2     Intake/Output Summary (Last 24 hours) at 01/20/2024 1015 Last data filed at 01/20/2024 0700 Gross per 24 hour  Intake 360 ml  Output 1.5 ml  Net 358.5 ml     Pressure Injury 01/12/24 Buttocks Left Deep Tissue Pressure Injury - Purple or maroon localized area of discolored intact skin or blood-filled blister due to damage of underlying soft tissue from pressure and/or shear. Purple DTI 2cm round (Active)  01/12/24 1830  Location: Buttocks  Location Orientation: Left  Staging: Deep Tissue Pressure Injury - Purple or maroon localized area of discolored intact skin or blood-filled blister due to damage of underlying soft tissue from pressure and/or shear.  Wound Description (Comments): Purple DTI 2cm round  Present on Admission: Yes    Physical Exam: Vital Signs Blood pressure 104/64, pulse 85, temperature 97.8 F (36.6 C), resp. rate 18, height 5\' 3"  (1.6 m), weight 66.7 kg, SpO2 97%. Gen: no distress, normal appearing.  Sitting upright in bedside chair.   HEENT: oral mucosa pink and moist, NCAT Cardio: Reg rate and rhythm Chest: normal effort, normal rate of breathing Abd: soft, normal bowel sounds, non-distended but protuberant Ext: no edema Psych: pleasant, flat affect Skin: intact; right chest dialysis port C/D/I Neuro: No focal neurologic deficits.  Awake, alert and oriented x 4.  Moving all 4 extremities  antigravity and against resistance.  Touch intact.  Reflexes normal.  No tone, stable 2/20   Assessment/Plan: 1. Functional deficits which require 3+ hours per day of interdisciplinary therapy in a comprehensive inpatient rehab setting. Physiatrist is providing close team supervision and 24 hour management of active medical problems listed below. Physiatrist and rehab team continue to assess barriers to discharge/monitor patient progress toward functional and medical goals  Care Tool:  Bathing    Body parts bathed by patient: Right arm, Left arm, Chest, Abdomen, Front perineal area, Right upper leg, Left upper leg, Face, Right lower leg, Left lower leg   Body parts bathed by helper: Left lower leg, Right lower leg, Buttocks     Bathing assist Assist Level: Minimal Assistance - Patient > 75%     Upper Body Dressing/Undressing Upper body dressing   What is the patient wearing?: Pull over shirt    Upper body assist Assist Level: Supervision/Verbal cueing    Lower Body Dressing/Undressing Lower body dressing      What is the patient wearing?: Pants     Lower body assist Assist for lower body dressing: Contact Guard/Touching assist     Toileting Toileting    Toileting assist Assist for  toileting: Maximal Assistance - Patient 25 - 49%     Transfers Chair/bed transfer  Transfers assist     Chair/bed transfer assist level: Contact Guard/Touching assist     Locomotion Ambulation   Ambulation assist      Assist level: Minimal Assistance - Patient > 75% Assistive device: Walker-rolling Max distance: 170ft   Walk 10 feet activity   Assist     Assist level: Minimal Assistance - Patient > 75% Assistive device: Walker-rolling   Walk 50 feet activity   Assist Walk 50 feet with 2 turns activity did not occur: Safety/medical concerns (fatigue)  Assist level: Minimal Assistance - Patient > 75% Assistive device: Walker-rolling    Walk 150 feet  activity   Assist Walk 150 feet activity did not occur: Safety/medical concerns (fatigue)         Walk 10 feet on uneven surface  activity   Assist Walk 10 feet on uneven surfaces activity did not occur: Safety/medical concerns (fatigue)         Wheelchair     Assist Is the patient using a wheelchair?: Yes Type of Wheelchair: Manual    Wheelchair assist level: Dependent - Patient 0% Max wheelchair distance: 19ft    Wheelchair 50 feet with 2 turns activity    Assist        Assist Level: Dependent - Patient 0%   Wheelchair 150 feet activity     Assist      Assist Level: Dependent - Patient 0%   Blood pressure 104/64, pulse 85, temperature 97.8 F (36.6 C), resp. rate 18, height 5\' 3"  (1.6 m), weight 66.7 kg, SpO2 97%.  Medical Problem List and Plan: 1. Functional deficits secondary cardiab debility complicated by AKI             -patient may shower             -ELOS/Goals: 8-11 days S             continue CIR  Messaged team to set d/c date  Placed order for central line removal  2.  Antithrombotics: -DVT/anticoagulation:  Pharmaceutical: continue Coumadin, INR reviewed and stable             -antiplatelet therapy:   3. Pain Management: denies pain. Tylenol prn mild pain, d/c oxycodone  4. Mood/Behavior/Sleep: Team to provide ego support. LCSW to follow for evaluation and support.              -d/c seroquel, continue low dose Remeron at nights. Melatonin prn added.              -antipsychotic agents: Seroquel daily at bedtime.  5. Neuropsych/cognition: This patient is capable of making decisions on her own behalf.  6. Skin/Wound Care: routine pressure relief measures. Monitor wound for healing  7. Fluids/Electrolytes/Nutrition:  Monitor I/O. Not on FR at this time             --Renal restrictions added to diet. Changed ensure to nephro             --continue vitamin C  8. Afib/A flutter: Monitor HR TID and for symptoms with activity.   -continue Amiodarone 200 mg daily with coumadin. -Heart rate well-controlled  9. Hypotension: TEDs. Midodrine 20 mg TID. KHT for support. Add fludricortisone 0.5mg  daily, continue, improved   -Blood pressure remains stable, mildly hypotensive  10. Acute on CKD: Now on HD MWF due to ischemic ATN/shock. Schedule HD at the end of the day to help  with tolerance of therapy             --continue Phoslo for now--should improve with renal restrictions.   Continue fluid restriction    11. Acute on chronic anemia: continue Aranesp weekly    12. HFrEF: Strict I/O. Daily weights. Fluid status being managed with HD.   -Weights approximately stable around dialysis [facility] Weights   01/19/24 1329 01/19/24 1707 01/20/24 0602  Weight: 67 kg 65.3 kg 66.7 kg    22. Depression: continue Zoloft, Seroquel and Remeron. Provide ego support.    23. Constipation: Has finally had BM after enema/miralax. D/c enema. Add daily colace  -2-15 last bowel movement, large  24. Fatigue: improved, will d/c fludricortisone  25. Type 2 diabetes: d/c feeding supplement  Improved, decrease CBG checks to BID  26.  Nausea/vomiting.  resolved  LOS: 8 days A FACE TO FACE EVALUATION WAS PERFORMED  Chalmers Iddings P Isabella Ida 01/20/2024, 10:15 AM

## 2024-01-20 NOTE — Procedures (Signed)
Left chest tunneled CV catheter removed without difficulty.   See full dictation in imaging.    Loman Brooklyn PA-C 01/20/2024 2:44 PM

## 2024-01-20 NOTE — Progress Notes (Signed)
Physical Therapy Session Note  Patient Details  Name: Vanessa [name] MRN: [id number] Date of Birth: 1950-12-04  Today's Date: 01/20/2024 PT Individual Time: 1002-1058 PT Individual Time Calculation (min): 56 min   Short Term Goals: Week 1:  PT Short Term Goal 1 (Week 1): pt will transfer supine<>sitting EOB with CGA PT Short Term Goal 2 (Week 1): pt will transfer sit<>stand with LRAD and min A consistantly PT Short Term Goal 3 (Week 1): pt will ambulate 85ft with LRAD and CGA  Skilled Therapeutic Interventions/Progress Updates:     Pt received seated in WC and agrees to therapy. No complaint of pain. WC transport to gym for time management. Pt requests to perform kinetron exercise for leg strengthening. Pt performs at resistance of 10 cm/sec, completing for 1:00 on and 2:00 off for interval training, with cues for body mechanics and correct performance. Pt performs sit to stand and stand step transfer to mat without AD, with cues for anterior wieght shift and minA at hips to promote anterior weight shift and trunk stability. PT encourages pt to attempt ambulation without AD. Pt is initially apprehensive but is agreeable with gentle encouragement. Pt stands with minA/modA and cues for powering up and shifting weight anteriorly. Pt ambulates x40' with minA at hips to promote stability and lateral weight shifting, with cues for increasing stride length to improve balance and gait mechanics. Seated rest break. PT ambulates additional bouts of 2x45' with same cues and assistance, with improving stride lengths and posture. Seated rest breaks between bouts. Pt left seated in WC with alarm intact and all needs within reach.   Therapy Documentation Precautions:  Precautions Precautions: Fall, Sternal Restrictions Weight Bearing Restrictions Per Provider Order: No LUE Weight Bearing Per Provider Order: Non weight bearing Other Position/Activity Restrictions: sternal precautions   Therapy/Group:  Individual Therapy  Beau Fanny, PT, DPT 01/20/2024, 5:02 PM

## 2024-01-20 NOTE — Progress Notes (Signed)
Physical Therapy Session Note  Patient Details  Name: Vanessa [name] MRN: [id number] Date of Birth: 1951-02-13  Today's Date: 01/20/2024 PT Individual Time: 4098-1191 PT Individual Time Calculation (min): 40 min   Short Term Goals: Week 1:  PT Short Term Goal 1 (Week 1): pt will transfer supine<>sitting EOB with CGA PT Short Term Goal 2 (Week 1): pt will transfer sit<>stand with LRAD and min A consistantly PT Short Term Goal 3 (Week 1): pt will ambulate 49ft with LRAD and CGA   Skilled Therapeutic Interventions/Progress Updates:    Session focused on education in regards to recovery/progress/goals, simulated car transfer for SUV height, transfer with RW, and seated therex.   Pt educated on energy conservation techniques, options in regards to HD schedule and use of public transportation and DME (w/c vs RW), etc. Pt verbalized understanding.  Pt with questions about SUV transfer. Brought pt to simulator to attempt and deomnstrated technique, educated on differences in various SUVs, and what makes things easier or more challenging based on height, positioning, etc. Attempted sit > stand with RW to attempt but pt reporting too fatigued to get up despite several attempts. States she is so worn out from this morning. Pivoted to focus on seated LE therex to increase functional strength to improve overall mobility. 4# ankle weights for marches, LAQ with 5 sec hold, and hamstring curls x 10 reps each x 2 sets. Cues for technique, upright posture and activation through core as well to work on trunk and postural activation.   Returned to room and transferred to recliner with max assist for sit > stand with RW, and CGA for pivot to the recliner. Set up with all needs in reach and handover to NT.   Pt reports overall sweating and nausea since hospitalization. Rest breaks given as needed throughout session to increase tolerance.   Therapy Documentation Precautions:  Precautions Precautions: Fall,  Sternal Restrictions Weight Bearing Restrictions Per Provider Order: No Other Position/Activity Restrictions: sternal precautions    Pain: Pain Assessment Pain Scale: 0-10 Pain Score: 0-No pain    Therapy/Group: Individual Therapy  Karolee Stamps Darrol Poke, PT, DPT, CBIS  01/20/2024, 11:58 AM

## 2024-01-20 NOTE — Progress Notes (Signed)
Occupational Therapy Session Note  Patient Details  Name: Vanessa [name] MRN: [id number] Date of Birth: 11-25-1951  Today's Date: 01/20/2024 OT Individual Time: 4098-1191 OT Individual Time Calculation (min): 44 min    Short Term Goals: Week 1:  OT Short Term Goal 1 (Week 1): Pt will demonstrate improved sit to stand strength to be able to rise to stand from low w/c seat with min A. OT Short Term Goal 1 - Progress (Week 1): Progressing toward goal OT Short Term Goal 2 (Week 1): Pt will be able to stand for at least 3 minutes to engage in LB bathing tasks for bottom and perineal area. OT Short Term Goal 2 - Progress (Week 1): Met OT Short Term Goal 3 (Week 1): Pt will be able to don pull on pants with min A. OT Short Term Goal 3 - Progress (Week 1): Met OT Short Term Goal 4 (Week 1): Pt will complete toilet transfer and ambulation to toilet with RW with CGA. OT Short Term Goal 4 - Progress (Week 1): Met Week 2:  OT Short Term Goal 1 (Week 2): STG = LTG due to ELOS   Skilled Therapeutic Interventions/Progress Updates:    Pt bed level at time of session, stating she wanted to wash up this morning. Bed mobility supine > sit with Supervision, MOD for sit <> stands throughout session but only CGA for pivot transfer with RW. Set up at sink level and performing sink level bathing for UB, face, and periarea with Supervision seated, declined performing in standing. UB dress CGA, LB dress MAX for pants and dependent for TEDS/gripper socks. Needing to toilet urgently, transported via wheelchair and stand pivot wheelchair <> toilet with MIN A overall, MAX for hygiene and clothing management stating it was too hard to perform in standing. Set up in wheelchair at end of session alarm on call bell in reach.   Therapy Documentation Precautions:  Precautions Precautions: Fall, Sternal Restrictions Weight Bearing Restrictions Per Provider Order: No LUE Weight Bearing Per Provider Order: Non weight  bearing Other Position/Activity Restrictions: sternal precautions    Therapy/Group: Individual Therapy  Erasmo Score 01/20/2024, 8:55 AM

## 2024-01-20 NOTE — Patient Care Conference (Signed)
Inpatient RehabilitationTeam Conference and Plan of Care Update Date: 01/19/2024   Time: 11:30 AM    Patient Name: Vanessa [name]      Medical Record Number: [id number]  Date of Birth: 12/16/1950 Sex: Female         Room/Bed: 4M08C/4M08C-01 Payor Info: Payor: [employer] / Plan: [employer] PPO / Product Type: *No Product type* /    Admit Date/Time:  01/12/2024  5:47 PM  Primary Diagnosis:  Debility  Hospital Problems: Principal Problem:   Debility    Expected Discharge Date: Expected Discharge Date: 01/29/24  Team Members Present: Physician leading conference: Dr. Sula Soda Social Worker Present: Dossie Der, LCSW Nurse Present: Chana Bode, RN PT Present: Ambrose Finland, PT OT Present: Candee Furbish, OT PPS Coordinator present : Fae Pippin, SLP     Current Status/Progress Goal Weekly Team Focus  Bowel/Bladder   Patient is continent of bowel and bladder   Pt will remain continent   Prevent incontinent    Swallow/Nutrition/ Hydration               ADL's   Set up A UB, Min A LB, Min/mod A toileting however requires heavy mod/max A to stand in prep for LB care   Supervision   Barriers- powering up to stand from low-mid surfaces, functional endurance, dynamic standing balance    Mobility   supine<>sit supervision, sit<>supine mod A using bed features, sit<>stands from elevated surfaces with RW mod A and from lower surfaces mod A +2, gait 163ft with RW light min A   supervision  barriers: sit<>stands, fear of falling, deconditioning/weakness, fatigue DME: RW and WC. D/C 3/1    Communication                Safety/Cognition/ Behavioral Observations               Pain   Patient died pain   pt will be pain free   Encourage and educate pt on pain management    Skin   Skin is intact   Maintain good skin integrity  Encourage repositioning      Discharge Planning:  Home sister coming to assist with her care. Have completed  Access [facility] transport form and faxed due to sister does not drive. Pt making good progress and awaiting team's recommendations   Team Discussion: Patient post AKI with new HD post cardiogenic shock with debility. Functional progress limited by fatigue, fear of falling and self limiting behaviors.  Patient on target to meet rehab goals: yes, currently needs set up for upper body care and mi assist for lower body care and min - mod assist for toileting. Needs mod - max assist to stand for peri care. Needs mod assist for sit - stand using a RW and min assist to ambulate up to 100' using a RW. Goals for discharge set for supervision overall.  *See Care Plan and progress notes for long and short-term goals.   Revisions to Treatment Plan:  Neuro psych eval   Teaching Needs: Safety, medications, transfers, toileting, dietary modifications, etc.   Current Barriers to Discharge: Decreased caregiver support, Hemodialysis, and caregivers/patient do not drive  Possible Resolutions to Barriers: Family education OP follow up services DME: RW, W/C     Medical Summary Current Status: hypotension, central line in place, hyperglycemia, fatigue  Barriers to Discharge: Hypotension;Medical stability  Barriers to Discharge Comments: hypotension, central line in place, hyperglycemia, fatigue Possible Resolutions to [employer] Focus: fludricortisone dced, consulted IR for removal  of central line, decrease CBG checks to daily before meals, continue D3 supplement   Continued Need for Acute Rehabilitation Level of Care: The patient requires daily medical management by a physician with specialized training in physical medicine and rehabilitation for the following reasons: Direction of a multidisciplinary physical rehabilitation program to maximize functional independence : Yes Medical management of patient stability for increased activity during participation in an intensive rehabilitation regime.:  Yes Analysis of laboratory values and/or radiology reports with any subsequent need for medication adjustment and/or medical intervention. : Yes   I attest that I was present, lead the team conference, and concur with the assessment and plan of the team.   Chana Bode B 01/20/2024, 7:58 AM

## 2024-01-20 NOTE — Progress Notes (Signed)
[facility] NEPHROLOGY PROGRESS NOTE  Assessment/ Plan: Pt is a 73 y.o. yo female     1.  AKI on CKD 3 yea: ATN from shock.  Maintain dialysis on MWF schedule.  Accepted to [facility] MWF 12:10 pm chair time. Pt will need to arrive at 11:15 am for paperwork on first day.   2.  Severe mitral regurgitation/tricuspid regurgitation: Status post repair with postoperative cardiac tamponade.  Additional management per cardiothoracic surgery. Stable at this time  3.  Acute exacerbation of congestive heart failure with reduced ejection fraction: Volume status significantly improved with ongoing hemodialysis/ultrafiltration.  4.  Shock: From a combination of cardiogenic and vasoplegic etiology-currently on midodrine after previously requiring pressor/inotropic support. Now on high dose midodrine  5.  Hyperphosphatemia: Phosphorus at goal. Cont binder  6. Anemia of CKD; Hb at goal.   Subjective: Patient tolerated dialysis yesterday with no issues.  Patient has no complaints today.  Objective Vital signs in last 24 hours: Vitals:   01/19/24 1820 01/19/24 1822 01/19/24 1928 01/20/24 0602  BP: 93/72  94/71 104/64  Pulse: 95 95 94 85  Resp:   18 18  Temp:   98.5 F (36.9 C) 97.8 F (36.6 C)  TempSrc:      SpO2: 98% 98% 99% 97%  Weight:    66.7 kg  Height:       Weight change: -0.9 kg  Intake/Output Summary (Last 24 hours) at 01/20/2024 1117 Last data filed at 01/20/2024 0700 Gross per 24 hour  Intake 360 ml  Output 1.5 ml  Net 358.5 ml       Labs: RENAL PANEL Recent Labs  Lab 01/18/24 1333  NA 128*  K 4.1  CL 88*  CO2 21*  GLUCOSE 151*  BUN 55*  CREATININE 8.75*  CALCIUM 9.2  PHOS 3.6  ALBUMIN 3.0*    Liver Function Tests: Recent Labs  Lab 01/18/24 1333  ALBUMIN 3.0*   No results for input(s): "LIPASE", "AMYLASE" in the last 168 hours. No results for input(s): "AMMONIA" in the last 168 hours. CBC: Recent Labs    01/09/24 0350 01/10/24 0500  01/12/24 0605 01/15/24 1304 01/18/24 1333  HGB 11.4* 11.1* 11.8* 12.4 11.5*  MCV 98.9 98.0 97.9 96.1 95.2    Cardiac Enzymes: No results for input(s): "CKTOTAL", "CKMB", "CKMBINDEX", "TROPONINI" in the last 168 hours. CBG: Recent Labs  Lab 01/18/24 2334 01/19/24 0623 01/19/24 1200 01/19/24 2055 01/20/24 0640  GLUCAP 130* 118* 116* 192* 124*    Iron Studies: No results for input(s): "IRON", "TIBC", "TRANSFERRIN", "FERRITIN" in the last 72 hours. Studies/Results: No results found.   Medications: Infusions:  anticoagulant sodium citrate      Scheduled Medications:  amiodarone  200 mg Oral Daily   ascorbic acid  250 mg Oral BID   Chlorhexidine Gluconate Cloth  6 each Topical Q12H   cholecalciferol  1,000 Units Oral Daily   docusate sodium  100 mg Oral Daily   Gerhardt's butt cream   Topical BID   lanthanum  500 mg Oral TID WC   midodrine  20 mg Oral TID WC   mirtazapine  7.5 mg Oral QHS   multivitamin  1 tablet Oral BID   polyethylene glycol  17 g Oral Daily   QUEtiapine  12.5 mg Oral QHS   senna-docusate  2 tablet Oral BID   sertraline  25 mg Oral Daily   sodium chloride flush  10-40 mL Intracatheter Q12H   warfarin  4 mg Oral ONCE-1600  Warfarin - Pharmacist Dosing Inpatient   Does not apply q1600    have reviewed scheduled and prn medications.  Physical Exam: General:NAD, comfortable Heart:normal rate, no rub Lungs: Bilateral chest rise with no increased work of breathing Abdomen:soft, Non-tender, non-distended Extremities: No edema at the bilateral ankles, warm and well-perfused Dialysis Access: Right IJ TDC in place  [facility] 01/20/2024,11:17 AM  LOS: 8 days

## 2024-01-20 NOTE — Progress Notes (Signed)
Advised by CSW of pt's planned d/c for 3/1. Contacted [facility] to provide pt's d/c date and that pt would need to start on 3/3. Clinic agreeable to keep pt's appt as is and pt can start on 3/3. Update provided to CSW and nephrologist. Will assist as needed.   Olivia Canter Renal Navigator [phone]

## 2024-01-21 LAB — GLUCOSE, CAPILLARY
Glucose-Capillary: 104 mg/dL — ABNORMAL HIGH (ref 70–99)
Glucose-Capillary: 90 mg/dL (ref 70–99)
Glucose-Capillary: 123 mg/dL — ABNORMAL HIGH (ref 70–99)
Glucose-Capillary: 185 mg/dL — ABNORMAL HIGH (ref 70–99)

## 2024-01-21 LAB — PROTIME-INR
INR: 1.8 — ABNORMAL HIGH (ref 0.8–1.2)
Prothrombin Time: 20.7 s — ABNORMAL HIGH (ref 11.4–15.2)

## 2024-01-21 MED ORDER — FLUDROCORTISONE ACETATE 0.1 MG PO TABS
0.1000 mg | ORAL_TABLET | Freq: Every day | ORAL | Status: DC
  Filled 2024-01-21: qty 1

## 2024-01-21 MED ORDER — DOCUSATE SODIUM 100 MG PO CAPS
100.0000 mg | ORAL_CAPSULE | Freq: Two times a day (BID) | ORAL | Status: DC
  Administered 2024-01-22 – 2024-01-29 (×9): 100 mg via ORAL
  Filled 2024-01-21 (×14): qty 1

## 2024-01-21 MED ORDER — FLUDROCORTISONE ACETATE 0.1 MG PO TABS
0.0500 mg | ORAL_TABLET | Freq: Every day | ORAL | Status: DC
  Administered 2024-01-21 – 2024-01-26 (×6): 0.05 mg via ORAL
  Filled 2024-01-21 (×5): qty 0.5

## 2024-01-21 MED ORDER — HEPARIN SODIUM (PORCINE) 1000 UNIT/ML IJ SOLN
INTRAMUSCULAR | Status: AC
  Filled 2024-01-21: qty 4

## 2024-01-21 MED ORDER — WARFARIN SODIUM 4 MG PO TABS
4.0000 mg | ORAL_TABLET | Freq: Once | ORAL | Status: AC
  Administered 2024-01-21: 4 mg via ORAL
  Filled 2024-01-21: qty 1

## 2024-01-21 NOTE — Progress Notes (Signed)
Occupational Therapy Session Note  Patient Details  Name: Vanessa [name] MRN: [id number] Date of Birth: July 30, 1951  Today's Date: 01/21/2024 OT Individual Time: 0905-1000 OT Individual Time Calculation (min): 55 min    Short Term Goals: Week 2:  OT Short Term Goal 1 (Week 2): STG = LTG due to ELOS  Skilled Therapeutic Interventions/Progress Updates:  Skilled OT intervention completed with focus on sit > stands, dynamic standing balance, standing tolerance, cognition. Pt received seated in w/c, agreeable to session. No pain reported.   Pt declined self-care needs. Transported dependently in w/c > gym. Pt participated in the following activities in standing with mod A needed to stand with RW x2 trials, CGA for dynamic balance to address standing (and sitting as pt limited by dizziness) tolerance and cognitive strategies needed for independence and safety with BADL management: -Motor speed dot test- 4.18 sec, min difficulty; results indicative of slower motor processing speed -Maze- mod difficulty requiring mod A to problem solve -Trail Making A- 1:52 min, 3 errors; completed in sitting due to dizziness/fatigue -Trail Making B- 3:32 min, 10 errors, min assist needed for sequencing; completed in sitting due to dizziness/fatigue  During 1st trial of standing, pt had full body shaking and misplacement of her feet as if she was going to pass out therefore guided pt to w/c and assessed BP; see below. On 2nd trial, pt with continued dizziness. Transitioned to using SCIFIT with level 1 resistance to attempt to stabilize BP, however after 2 mins, pt with syncope episode (though denied dizziness prior) with BUE violently dropping from arm bike and head tilting back. Pt immediately regained alertness and attended to OT however stated "I don't feel good." OT elevated BLE with leg rests, transported pt back to room. Notified nursing of syncope episode and nurse in room. OT assisted pt with min A slide board  transfer > EOB, then max A for bed mobility. Per nursing, pt had not received her BP stabilizer this AM due to it being unavailable before therapies. BP stable and symptoms resolved with pt semi supine.  Nephrology present inquiring pt status and OT relayed therapy events. Pt remained semi upright in bed with BLE elevated, with bed alarm on/activated, and with all needs in reach at end of session.  Vitals BP 110/84 (in sitting after standing trial; knee TEDs already on), HR 96 bpm BP 94/67 (in sitting after 2nd standing trial), HR 98 bpm BP 87/72 (in sitting after prolonged seated rest), HR 93 bpm BP 115/77 (semi supine in bed), HR 93 bpm   Therapy Documentation Precautions:  Precautions Precautions: Fall, Sternal Restrictions Weight Bearing Restrictions Per Provider Order: No Other Position/Activity Restrictions: sternal precautions    Therapy/Group: Individual Therapy  [name], MS, OTR/L  01/21/2024, 10:17 AM

## 2024-01-21 NOTE — Progress Notes (Signed)
Physical Therapy Session Note  Patient Details  Name: Vanessa [name] MRN: [id number] Date of Birth: 1951-08-29  Today's Date: 01/21/2024 PT Individual Time: 0702-0811 and 4098-1191 PT Individual Time Calculation (min): 69 min and 68 min  Short Term Goals: Week 1:  PT Short Term Goal 1 (Week 1): pt will transfer supine<>sitting EOB with CGA PT Short Term Goal 2 (Week 1): pt will transfer sit<>stand with LRAD and min A consistantly PT Short Term Goal 3 (Week 1): pt will ambulate 8ft with LRAD and CGA  Skilled Therapeutic Interventions/Progress Updates:   Treatment Session 1 Received pt semi-reclined in bed, pt agreeable to PT treatment, and denied any pain during session but did not sleep well last night. Session with emphasis on functional mobility/transfers, generalized strengthening and endurance, sit<>stands, dynamic standing balance/coordination, and gait training. Donned teds in supine with total A for edema management and non-skid socks with max A. Pt transferred semi-reclined<>sitting L EOB with HOB elevated and supervision with increased time/effort to scoot hips to EOB.   Stood from slightly elevated EOB (to simulate pt's bed at home) with RW and mod A x 5 trials with cues for anterior weight shifting, to scoot to edge of bed, and to position feet underneath her. Pt then ambulated 46ft with RW and CGA - to fatigue. Transported remainder of way to/from room in [facility] dependently for energy conservation purposes. Stood from [facility] with RW and mod A and transferred onto mat via stand<>pivot with RW and CGA. Stood from low sitting EOM with RW and mod A x 3 trials and performed TUG with RW and CGA with average of 58.3 seconds. Pt educated on test results and significance indicating high fall risk and importance of using RW upon discharge.  Trial 1: 61 seconds Trial 2: 55 seconds  Trial 3: 59 seconds During seated rest breaks discussed recommendation for HHPT and DME. Took measurements to fit pt for  18x16 manual WC and pt requesting RW. Informed pt that insurance may not cover both RW and WC; pt okay with ordering RW through [employer] for convenience. Stood from low sitting EOM with RW and mod A x 2 trials and performed standing alternating toe taps to 6in step 4x10 reps with BUE support and min A for balance - pt reporting 9/10 fatigue afterwards. Transferred into WC via stand<>pivot with RW and returned to room. Concluded session with pt sitting in WC, needs within reach, and chair pad alarm on awaiting upcoming OT session.   Treatment Session 2 Received pt semi-reclined in bed, pt agreeable to PT treatment, and denied any pain during session. Per primary OT, pt with syncopal-like episode in last session. Pt transferred semi-reclined<>sitting L EOB with HOB elevated and supervision. Stood from EOB with RW and mod A for BP reading. Pt unable to stand long enough reporting "blurry" vision and requested to lie down - sit<>supine with mod A for BLE management and required +2 to scoot to [facility] - treatment team notified.   Provided pt with HEP and educated on frequency/duration/technique for the following exercises: - Supine Gluteal Sets  - 1 x daily - 7 x weekly - 3 sets - 10 reps - 5 hold - Supine Bridge  - 1 x daily - 7 x weekly - 3 sets - 8 reps - Supine Hip Abduction  - 1 x daily - 7 x weekly - 3 sets - 10 reps - Supine March  - 1 x daily - 7 x weekly - 3 sets - 10  reps - Seated Long Arc Quad  - 1 x daily - 7 x weekly - 3 sets - 10 reps - Seated March  - 1 x daily - 7 x weekly - 3 sets - 10 reps - Seated Hip Adduction Squeeze with Ball  - 1 x daily - 7 x weekly - 2 sets - 10 reps - 10 hold - Seated Hip Abduction with Resistance  - 1 x daily - 7 x weekly - 3 sets - 10 reps - Seated Heel Raise  - 1 x daily - 7 x weekly - 2 sets - 20 reps - Seated Heel Toe Raises  - 1 x daily - 7 x weekly - 2 sets - 20 reps Concluded session with pt semi-reclined in bed, needs within reach, and bed alarm on.   Vitals  with teds donned: -supine at rest: 105/72, HR 85bpm, and SPO2 97% - pt reported feeling "better" than earlier with OT -sitting EOB: 80/62 on trial 1 and 81/63 on trial 2, HR 93bpm, and SPO2 99% - no change in symptoms -standing: unable to get reading -supine: 101/64 -supine after ~5 minutes: 92/65 - pt reported feeling slightly better but not completley  Therapy Documentation Precautions:  Precautions Precautions: Fall, Sternal Restrictions Weight Bearing Restrictions Per Provider Order: No LUE Weight Bearing Per Provider Order: Non weight bearing Other Position/Activity Restrictions: sternal precautions  Therapy/Group: Individual Therapy Marlana Salvage Zaunegger Blima Rich PT, DPT 01/21/2024, 6:34 AM

## 2024-01-21 NOTE — Progress Notes (Signed)
PROGRESS NOTE   Subjective/Complaints: Had temporary episode of blurred vision and shaking this morning after standing with OT in gym, she felt hot at this time, added back daily fludricortisone  ROS:   ROS: Denies fevers, chills,   abdominal pain, SOB, chest pain, new weakness or paraesthesias.   + Nausea/vomiting-intermittent +temporary blurred vision today  Objective:   IR Removal Tun Cv Cath W/O FL Result Date: 01/20/2024 INDICATION: 73 year old female with chronic kidney disease with tunneled CV catheter for removal today. EXAM: REMOVAL TUNNELED CENTRAL VENOUS CATHETER MEDICATIONS: None ANESTHESIA/SEDATION: None FLUOROSCOPY: None used COMPLICATIONS: None immediate. PROCEDURE: Informed written consent was obtained from the patient after a thorough discussion of the procedural risks, benefits and alternatives. All questions were addressed. Maximal Sterile Barrier Technique was utilized including caps, mask, sterile gowns, sterile gloves, sterile drape, hand hygiene and skin antiseptic. A timeout was performed prior to the initiation of the procedure. The patient's left chest and catheter was prepped and draped in a normal sterile fashion. Heparin was removed from both ports of catheter. 1% lidocaine was used for local anesthesia. Using gentle blunt dissection the cuff of the catheter was exposed and the catheter was removed in it's entirety. Pressure was held till hemostasis was obtained. A sterile dressing was applied. The patient tolerated the procedure well with no immediate complications. IMPRESSION: Successful catheter removal as described above. Performed By Theresa Mulligan, PA-C Electronically Signed   By: Marliss Coots M.D.   On: 01/20/2024 14:48    No results for input(s): "WBC", "HGB", "HCT", "PLT" in the last 72 hours.   No results for input(s): "NA", "K", "CL", "CO2", "GLUCOSE", "BUN", "CREATININE", "CALCIUM" in the last 72  hours.    Intake/Output Summary (Last 24 hours) at 01/21/2024 1437 Last data filed at 01/21/2024 0800 Gross per 24 hour  Intake 360 ml  Output --  Net 360 ml     Pressure Injury 01/12/24 Buttocks Left Deep Tissue Pressure Injury - Purple or maroon localized area of discolored intact skin or blood-filled blister due to damage of underlying soft tissue from pressure and/or shear. Purple DTI 2cm round (Active)  01/12/24 1830  Location: Buttocks  Location Orientation: Left  Staging: Deep Tissue Pressure Injury - Purple or maroon localized area of discolored intact skin or blood-filled blister due to damage of underlying soft tissue from pressure and/or shear.  Wound Description (Comments): Purple DTI 2cm round  Present on Admission: Yes    Physical Exam: Vital Signs Blood pressure 103/74, pulse 81, temperature 97.9 F (36.6 C), temperature source Oral, resp. rate 20, height 5\' 3"  (1.6 m), weight 66.3 kg, SpO2 100%. Gen: no distress, normal appearing.  Sitting upright in bedside chair.   HEENT: oral mucosa pink and moist, NCAT Cardio: Reg rate and rhythm Chest: normal effort, normal rate of breathing Abd: soft, normal bowel sounds, non-distended but protuberant Ext: no edema Psych: pleasant, flat affect Skin: intact; right chest dialysis port C/D/I Neuro: No focal neurologic deficits.  Awake, alert and oriented x 4.  Moving all 4 extremities antigravity and against resistance.  Touch intact.  Reflexes normal.  No tone, stable 2/21   Assessment/Plan: 1. Functional deficits which  require 3+ hours per day of interdisciplinary therapy in a comprehensive inpatient rehab setting. Physiatrist is providing close team supervision and 24 hour management of active medical problems listed below. Physiatrist and rehab team continue to assess barriers to discharge/monitor patient progress toward functional and medical goals  Care Tool:  Bathing    Body parts bathed by patient: Right arm, Left  arm, Chest, Abdomen, Front perineal area, Right upper leg, Left upper leg, Face, Right lower leg, Left lower leg   Body parts bathed by helper: Left lower leg, Right lower leg, Buttocks     Bathing assist Assist Level: Minimal Assistance - Patient > 75%     Upper Body Dressing/Undressing Upper body dressing   What is the patient wearing?: Pull over shirt    Upper body assist Assist Level: Supervision/Verbal cueing    Lower Body Dressing/Undressing Lower body dressing      What is the patient wearing?: Pants     Lower body assist Assist for lower body dressing: Contact Guard/Touching assist     Toileting Toileting    Toileting assist Assist for toileting: Maximal Assistance - Patient 25 - 49%     Transfers Chair/bed transfer  Transfers assist     Chair/bed transfer assist level: Contact Guard/Touching assist     Locomotion Ambulation   Ambulation assist      Assist level: Minimal Assistance - Patient > 75% Assistive device: Walker-rolling Max distance: 30   Walk 10 feet activity   Assist     Assist level: Minimal Assistance - Patient > 75% Assistive device: Walker-rolling   Walk 50 feet activity   Assist Walk 50 feet with 2 turns activity did not occur: Safety/medical concerns (fatigue)  Assist level: Minimal Assistance - Patient > 75% Assistive device: Walker-rolling    Walk 150 feet activity   Assist Walk 150 feet activity did not occur: Safety/medical concerns (fatigue)         Walk 10 feet on uneven surface  activity   Assist Walk 10 feet on uneven surfaces activity did not occur: Safety/medical concerns (fatigue)         Wheelchair     Assist Is the patient using a wheelchair?: Yes Type of Wheelchair: Manual    Wheelchair assist level: Dependent - Patient 0% Max wheelchair distance: 53ft    Wheelchair 50 feet with 2 turns activity    Assist        Assist Level: Dependent - Patient 0%   Wheelchair 150  feet activity     Assist      Assist Level: Dependent - Patient 0%   Blood pressure 103/74, pulse 81, temperature 97.9 F (36.6 C), temperature source Oral, resp. rate 20, height 5\' 3"  (1.6 m), weight 66.3 kg, SpO2 100%.  Medical Problem List and Plan: 1. Functional deficits secondary cardiab debility complicated by AKI             -patient may shower             -ELOS/Goals: 8-11 days S             continue CIR  Messaged team to set d/c date  Placed order for central line removal  2.  Antithrombotics: -DVT/anticoagulation:  Pharmaceutical: continue Coumadin, INR reviewed and stable             -antiplatelet therapy:   3. Pain Management: denies pain. Tylenol prn mild pain, d/c oxycodone  4. Mood/Behavior/Sleep: Team to provide ego support. LCSW to follow for  evaluation and support.              -d/c seroquel, continue low dose Remeron at nights. Melatonin prn added.              -antipsychotic agents: Seroquel daily at bedtime.  5. Neuropsych/cognition: This patient is capable of making decisions on her own behalf.  6. Skin/Wound Care: routine pressure relief measures. Monitor wound for healing  7. Fluids/Electrolytes/Nutrition:  Monitor I/O. Not on FR at this time             --Renal restrictions added to diet. Changed ensure to nephro             --continue vitamin C  8. Afib/A flutter: Monitor HR TID and for symptoms with activity.  -continue Amiodarone 200 mg daily with coumadin. -Heart rate well-controlled  9. Hypotension: TEDs. Midodrine 20 mg TID. KHT for support. Add fludricortisone 0.5mg  daily, continue, improved   -Blood pressure remains stable, mildly hypotensive  10. Acute on CKD: Now on HD MWF due to ischemic ATN/shock. Schedule HD at the end of the day to help with tolerance of therapy             --continue Phoslo for now--should improve with renal restrictions.   Continue fluid restriction    11. Acute on chronic anemia: continue Aranesp  weekly    12. HFrEF: Strict I/O. Daily weights. Fluid status being managed with HD.   -Weights approximately stable around dialysis [facility] Weights   01/19/24 1707 01/20/24 0602 01/21/24 0541  Weight: 65.3 kg 66.7 kg 66.3 kg    22. Depression: continue Zoloft, Seroquel and Remeron. Provide ego support.    23. Constipation: Increase colace to BID  24. Fatigue: improved, fludricortisone restarted due to symptoms concerning for orthostasis  25. Type 2 diabetes: d/c feeding supplement  Improved, decrease CBG checks to daily  26.  Nausea/vomiting.  Resolved  27. Blurred vision: discussed could be 2/2 hypotension, resolved, fludricortisone restarted  LOS: 9 days A FACE TO FACE EVALUATION WAS PERFORMED  Horton Chin 01/21/2024, 2:37 PM

## 2024-01-21 NOTE — Progress Notes (Signed)
Physical Therapy Weekly Progress Note  Patient Details  Name: Vanessa [name] MRN: [id number] Date of Birth: 03-03-1951  Beginning of progress report period: January 13, 2024 End of progress report period: January 21, 2024  Patient has met 2 of 3 short term goals. Pt demonstrates gradual progress towards long term goals. Pt is currently able to roll L/R and transfer semi-reclined<>sitting EOB with supervision using bed features but requires mod A to transition into supine. Pt is able to perform sit<>stands with RW and min A (mod A from lower surfaces), and ambulate up to 183ft with RW and CGA. Pt demonstrates good adherence to sternal precautions, but biggest barrier remains sit<>stand transfer. Pt is limited by fatigue, global weakness/deconditioning, fear, and decreased balance strategies.   Patient continues to demonstrate the following deficits muscle weakness, decreased cardiorespiratoy endurance, and decreased standing balance, decreased balance strategies, and difficulty maintaining precautions and therefore will continue to benefit from skilled PT intervention to increase functional independence with mobility.  Patient progressing toward long term goals..  Continue plan of care.  PT Short Term Goals Week 1:  PT Short Term Goal 1 (Week 1): pt will transfer supine<>sitting EOB with CGA PT Short Term Goal 1 - Progress (Week 1): Met PT Short Term Goal 2 (Week 1): pt will transfer sit<>stand with LRAD and min A consistantly PT Short Term Goal 2 - Progress (Week 1): Progressing toward goal PT Short Term Goal 3 (Week 1): pt will ambulate 54ft with LRAD and CGA PT Short Term Goal 3 - Progress (Week 1): Met Week 2:  PT Short Term Goal 1 (Week 2): STG=LTG due to LOS  Skilled Therapeutic Interventions/Progress Updates:  Ambulation/gait training;Discharge planning;Functional mobility training;Psychosocial support;Therapeutic Activities;Balance/vestibular training;Disease  management/prevention;Neuromuscular re-education;Skin care/wound management;Therapeutic Exercise;Wheelchair propulsion/positioning;DME/adaptive equipment instruction;Pain management;Splinting/orthotics;UE/LE Strength taining/ROM;Community reintegration;Patient/family education;Stair training;UE/LE Coordination activities   Therapy Documentation Precautions:  Precautions Precautions: Fall, Sternal Restrictions Weight Bearing Restrictions Per Provider Order: No LUE Weight Bearing Per Provider Order: Non weight bearing Other Position/Activity Restrictions: sternal precautions  Therapy/Group: Individual Therapy Marlana Salvage Zaunegger Blima Rich PT, DPT 01/21/2024, 6:37 AM

## 2024-01-21 NOTE — Progress Notes (Signed)
[facility] NEPHROLOGY PROGRESS NOTE  Assessment/ Plan: Pt is a 73 y.o. yo female     1.  AKI on CKD 3 yea: ATN from shock.  Maintain dialysis on MWF schedule.  Accepted to [facility] MWF 12:10 pm chair time. Pt will need to arrive at 11:15 am for paperwork on first day.   2.  Severe mitral regurgitation/tricuspid regurgitation: Status post repair with postoperative cardiac tamponade.  Additional management per cardiothoracic surgery. Stable at this time  3.  Acute exacerbation of congestive heart failure with reduced ejection fraction: Volume status significantly improved with ongoing hemodialysis/ultrafiltration.  4.  Shock: From a combination of cardiogenic and vasoplegic etiology-currently on midodrine after previously requiring pressor/inotropic support. Now on high dose midodrine.  Would give before therapy  5.  Hyperphosphatemia: Phosphorus at goal. Cont binder  6. Anemia of CKD; Hb at goal.    Given the patient stability we will not plan to see her over the weekend.  Subjective: Patient was working with physical therapy this morning but got dizzy and lightheaded.  Had not received her midodrine prior to doing therapy.  Otherwise no acute events  Objective Vital signs in last 24 hours: Vitals:   01/20/24 0602 01/20/24 1551 01/20/24 1951 01/21/24 0541  BP: 104/64 116/81 108/79 92/64  Pulse: 85 90 91 81  Resp: 18 18 18 17   Temp: 97.8 F (36.6 C) 97.9 F (36.6 C) 98 F (36.7 C) 97.8 F (36.6 C)  TempSrc:  Oral    SpO2: 97% 99% 97% 99%  Weight: 66.7 kg   66.3 kg  Height:       Weight change: -0.7 kg  Intake/Output Summary (Last 24 hours) at 01/21/2024 1214 Last data filed at 01/21/2024 0800 Gross per 24 hour  Intake 550 ml  Output --  Net 550 ml       Labs: RENAL PANEL Recent Labs  Lab 01/18/24 1333  NA 128*  K 4.1  CL 88*  CO2 21*  GLUCOSE 151*  BUN 55*  CREATININE 8.75*  CALCIUM 9.2  PHOS 3.6  ALBUMIN 3.0*    Liver Function  Tests: Recent Labs  Lab 01/18/24 1333  ALBUMIN 3.0*   No results for input(s): "LIPASE", "AMYLASE" in the last 168 hours. No results for input(s): "AMMONIA" in the last 168 hours. CBC: Recent Labs    01/09/24 0350 01/10/24 0500 01/12/24 0605 01/15/24 1304 01/18/24 1333  HGB 11.4* 11.1* 11.8* 12.4 11.5*  MCV 98.9 98.0 97.9 96.1 95.2    Cardiac Enzymes: No results for input(s): "CKTOTAL", "CKMB", "CKMBINDEX", "TROPONINI" in the last 168 hours. CBG: Recent Labs  Lab 01/20/24 0640 01/20/24 1156 01/20/24 1701 01/20/24 2105 01/21/24 0623  GLUCAP 124* 129* 73 126* 104*    Iron Studies: No results for input(s): "IRON", "TIBC", "TRANSFERRIN", "FERRITIN" in the last 72 hours. Studies/Results: IR Removal Tun Cv Cath W/O FL Result Date: 01/20/2024 INDICATION: 73 year old female with chronic kidney disease with tunneled CV catheter for removal today. EXAM: REMOVAL TUNNELED CENTRAL VENOUS CATHETER MEDICATIONS: None ANESTHESIA/SEDATION: None FLUOROSCOPY: None used COMPLICATIONS: None immediate. PROCEDURE: Informed written consent was obtained from the patient after a thorough discussion of the procedural risks, benefits and alternatives. All questions were addressed. Maximal Sterile Barrier Technique was utilized including caps, mask, sterile gowns, sterile gloves, sterile drape, hand hygiene and skin antiseptic. A timeout was performed prior to the initiation of the procedure. The patient's left chest and catheter was prepped and draped in a normal sterile fashion. Heparin was  removed from both ports of catheter. 1% lidocaine was used for local anesthesia. Using gentle blunt dissection the cuff of the catheter was exposed and the catheter was removed in it's entirety. Pressure was held till hemostasis was obtained. A sterile dressing was applied. The patient tolerated the procedure well with no immediate complications. IMPRESSION: Successful catheter removal as described above. Performed By  Theresa Mulligan, PA-C Electronically Signed   By: Marliss Coots M.D.   On: 01/20/2024 14:48     Medications: Infusions:  anticoagulant sodium citrate      Scheduled Medications:  amiodarone  200 mg Oral Daily   ascorbic acid  250 mg Oral BID   Chlorhexidine Gluconate Cloth  6 each Topical Q12H   cholecalciferol  1,000 Units Oral Daily   docusate sodium  100 mg Oral Daily   fludrocortisone  0.1 mg Oral Daily   Gerhardt's butt cream   Topical BID   lanthanum  500 mg Oral TID WC   midodrine  20 mg Oral TID WC   mirtazapine  7.5 mg Oral QHS   multivitamin  1 tablet Oral BID   polyethylene glycol  17 g Oral Daily   senna-docusate  2 tablet Oral BID   sertraline  25 mg Oral Daily   sodium chloride flush  10-40 mL Intracatheter Q12H   warfarin  4 mg Oral ONCE-1600   Warfarin - Pharmacist Dosing Inpatient   Does not apply q1600    have reviewed scheduled and prn medications.  Physical Exam: General:NAD, comfortable Heart:normal rate, no rub Lungs: Bilateral chest rise with no increased work of breathing Abdomen:soft, Non-tender, non-distended Extremities: No edema at the bilateral ankles, warm and well-perfused Dialysis Access: Right IJ TDC in place  Darnell Level 01/21/2024,12:14 PM  LOS: 9 days

## 2024-01-21 NOTE — Progress Notes (Signed)
Pt unavailable/ OTF for AM line care

## 2024-01-21 NOTE — Progress Notes (Signed)
PHARMACY - ANTICOAGULATION CONSULT NOTE  Pharmacy Consult for Warfarin Indication: atrial fibrillation  Allergies  Allergen Reactions   Spironolactone     hyperkalemia    Patient Measurements: Height: 5\' 3"  (160 cm) Weight: 66.3 kg (146 lb 2.6 oz) IBW/kg (Calculated) : 52.4 Heparin Dosing Weight: ~ 70 kg  Vital Signs: Temp: 97.8 F (36.6 C) (02/21 0541) BP: 92/64 (02/21 0541) Pulse Rate: 81 (02/21 0541)  Labs: Recent Labs    01/18/24 1333 01/19/24 0451 01/20/24 0448  HGB 11.5*  --   --   HCT 35.4*  --   --   PLT 150  --   --   LABPROT  --  22.0* 20.3*  INR  --  1.9* 1.7*  CREATININE 8.75*  --   --    Estimated Creatinine Clearance: 5.3 mL/min (A) (by C-G formula based on SCr of 8.75 mg/dL (H)).  Assessment: 73 yo female s/p scheduled bioprosthetic mitral valve replacement with tissue valve 12/30 now with afib, pharmacy asked to dose anticoagulation with IV heparin. Spontaneously converted 1/7; flipped back to AF overnight 1/9 0300. Baseline INR 1.1, slow uptrend to 1.3 s/p 6 days warfarin therapy, total dose 20 mg, then held 1/21-24 for tunneled access plans. Patient now s/p [facility] and tunneled central line placement 1/24 AM on CRRT, plan HD MWF.  Off norepinephrine since 1/26, remains on midodrine. Resumed anticoagulation per VIR after tunneled cath placement. Heparin stopped 1/31 with INR >2.  Amiodarone  is known to increase warfarin effect. Although he has been on amiodarone since [facility] acute care admit date 11/29/23, thus should have seen effect by now.    2/21 UPDATE:  INR: 1.8    Goal of Therapy:  INR goal 2 - 3 (bMVR) Monitor platelets by anticoagulation protocol: Yes   Plan:  Coumadin 4mg  x1 dose today.   Daily INR   Thank you  Greta Doom BS, PharmD, BCPS Clinical Pharmacist 01/21/2024 11:29 AM  Contact: [phone] after 3 PM  "Be curious, not judgmental..." -Debbora Dus

## 2024-01-21 NOTE — Progress Notes (Signed)
   01/21/24 1730  Vitals  Temp 97.7 F (36.5 C)  Pulse Rate 88  Resp (!) 22  BP 101/72  SpO2 100 %  O2 Device Room Air   Received patient in bed to unit.  Alert and oriented.  Informed consent signed and in chart.   TX duration:3.5 hours  Patient tolerated well.  Transported back to the room  Alert, without acute distress.  Hand-off given to patient's nurse.   Access used: R SCVC Access issues: None  Total UF removed: . Patient not able to meet UFG dur to hypotension, BP's into the low 90's. Dr, Valentino Nose made aware/  Medication(s) given: See MAR    Laqueta Due, RN [facility] D[facility] Unit

## 2024-01-22 DIAGNOSIS — I959 Hypotension, unspecified: Secondary | ICD-10-CM

## 2024-01-22 DIAGNOSIS — Z992 Dependence on renal dialysis: Secondary | ICD-10-CM

## 2024-01-22 DIAGNOSIS — N186 End stage renal disease: Secondary | ICD-10-CM

## 2024-01-22 DIAGNOSIS — K5901 Slow transit constipation: Secondary | ICD-10-CM

## 2024-01-22 LAB — GLUCOSE, CAPILLARY
Glucose-Capillary: 139 mg/dL — ABNORMAL HIGH (ref 70–99)
Glucose-Capillary: 140 mg/dL — ABNORMAL HIGH (ref 70–99)
Glucose-Capillary: 123 mg/dL — ABNORMAL HIGH (ref 70–99)
Glucose-Capillary: 154 mg/dL — ABNORMAL HIGH (ref 70–99)

## 2024-01-22 LAB — PROTIME-INR
INR: 1.8 — ABNORMAL HIGH (ref 0.8–1.2)
Prothrombin Time: 21.2 s — ABNORMAL HIGH (ref 11.4–15.2)

## 2024-01-22 LAB — PARATHYROID HORMONE, INTACT (NO CA): PTH: 110 pg/mL — ABNORMAL HIGH (ref 15–65)

## 2024-01-22 MED ORDER — WARFARIN SODIUM 3 MG PO TABS
3.0000 mg | ORAL_TABLET | Freq: Once | ORAL | Status: AC
  Administered 2024-01-22: 3 mg via ORAL
  Filled 2024-01-22: qty 1

## 2024-01-22 NOTE — Progress Notes (Signed)
 PROGRESS NOTE   Subjective/Complaints:  Pt doing alright, slept well, denies pain, LBM 2 days ago x3 episodes but none since (has had irregular bowel schedule since starting dialysis). Had dialysis yesterday, went ok, hasn't made urine since which is normal for her. Denies any other complaints or concerns today.   ROS:   ROS: Denies fevers, chills,   abdominal pain, SOB, chest pain, new weakness or paraesthesias.   + Nausea/vomiting-intermittent-none voiced +temporary blurred vision today-resolved  Objective:   IR Removal Tun Cv Cath W/O FL Result Date: 01/20/2024 INDICATION: 73 year old female with chronic kidney disease with tunneled CV catheter for removal today. EXAM: REMOVAL TUNNELED CENTRAL VENOUS CATHETER MEDICATIONS: None ANESTHESIA/SEDATION: None FLUOROSCOPY: None used COMPLICATIONS: None immediate. PROCEDURE: Informed written consent was obtained from the patient after a thorough discussion of the procedural risks, benefits and alternatives. All questions were addressed. Maximal Sterile Barrier Technique was utilized including caps, mask, sterile gowns, sterile gloves, sterile drape, hand hygiene and skin antiseptic. A timeout was performed prior to the initiation of the procedure. The patient's left chest and catheter was prepped and draped in a normal sterile fashion. Heparin was removed from both ports of catheter. 1% lidocaine was used for local anesthesia. Using gentle blunt dissection the cuff of the catheter was exposed and the catheter was removed in it's entirety. Pressure was held till hemostasis was obtained. A sterile dressing was applied. The patient tolerated the procedure well with no immediate complications. IMPRESSION: Successful catheter removal as described above. Performed By Theresa Mulligan, PA-C Electronically Signed   By: Marliss Coots M.D.   On: 01/20/2024 14:48    No results for input(s): "WBC", "HGB",  "HCT", "PLT" in the last 72 hours.   No results for input(s): "NA", "K", "CL", "CO2", "GLUCOSE", "BUN", "CREATININE", "CALCIUM" in the last 72 hours.    Intake/Output Summary (Last 24 hours) at 01/22/2024 1147 Last data filed at 01/22/2024 0912 Gross per 24 hour  Intake 150 ml  Output --  Net 150 ml     Pressure Injury 01/12/24 Buttocks Left Deep Tissue Pressure Injury - Purple or maroon localized area of discolored intact skin or blood-filled blister due to damage of underlying soft tissue from pressure and/or shear. Purple DTI 2cm round (Active)  01/12/24 1830  Location: Buttocks  Location Orientation: Left  Staging: Deep Tissue Pressure Injury - Purple or maroon localized area of discolored intact skin or blood-filled blister due to damage of underlying soft tissue from pressure and/or shear.  Wound Description (Comments): Purple DTI 2cm round  Present on Admission: Yes    Physical Exam: Vital Signs Blood pressure (!) 111/96, pulse 85, temperature 98.7 F (37.1 C), resp. rate 17, height 5\' 3"  (1.6 m), weight 66.3 kg, SpO2 96%. Gen: no distress, normal appearing.  Sitting upright in bedside chair.   HEENT: oral mucosa pink and moist, NCAT Cardio: Reg rate and rhythm, no m/r/g appreciated Chest: normal effort, normal rate of breathing, CTAB Abd: soft, normal bowel sounds, non-distended, nonTTP Ext: no edema Psych: pleasant, flat affect Skin: intact; right chest dialysis port C/D/I Neuro: No focal neurologic deficits.  Awake, alert and oriented x 4.  Moving all 4  extremities antigravity and against resistance.  Touch intact.  Reflexes normal.  No tone, stable 2/22   Assessment/Plan: 1. Functional deficits which require 3+ hours per day of interdisciplinary therapy in a comprehensive inpatient rehab setting. Physiatrist is providing close team supervision and 24 hour management of active medical problems listed below. Physiatrist and rehab team continue to assess barriers to  discharge/monitor patient progress toward functional and medical goals  Care Tool:  Bathing    Body parts bathed by patient: Right arm, Left arm, Chest, Abdomen, Front perineal area, Right upper leg, Left upper leg, Face, Right lower leg, Left lower leg   Body parts bathed by helper: Left lower leg, Right lower leg, Buttocks     Bathing assist Assist Level: Minimal Assistance - Patient > 75%     Upper Body Dressing/Undressing Upper body dressing   What is the patient wearing?: Pull over shirt    Upper body assist Assist Level: Supervision/Verbal cueing    Lower Body Dressing/Undressing Lower body dressing      What is the patient wearing?: Pants     Lower body assist Assist for lower body dressing: Contact Guard/Touching assist     Toileting Toileting    Toileting assist Assist for toileting: Maximal Assistance - Patient 25 - 49%     Transfers Chair/bed transfer  Transfers assist     Chair/bed transfer assist level: Contact Guard/Touching assist     Locomotion Ambulation   Ambulation assist      Assist level: Minimal Assistance - Patient > 75% Assistive device: Walker-rolling Max distance: 30   Walk 10 feet activity   Assist     Assist level: Minimal Assistance - Patient > 75% Assistive device: Walker-rolling   Walk 50 feet activity   Assist Walk 50 feet with 2 turns activity did not occur: Safety/medical concerns (fatigue)  Assist level: Minimal Assistance - Patient > 75% Assistive device: Walker-rolling    Walk 150 feet activity   Assist Walk 150 feet activity did not occur: Safety/medical concerns (fatigue)         Walk 10 feet on uneven surface  activity   Assist Walk 10 feet on uneven surfaces activity did not occur: Safety/medical concerns (fatigue)         Wheelchair     Assist Is the patient using a wheelchair?: Yes Type of Wheelchair: Manual    Wheelchair assist level: Dependent - Patient 0% Max  wheelchair distance: 68ft    Wheelchair 50 feet with 2 turns activity    Assist        Assist Level: Dependent - Patient 0%   Wheelchair 150 feet activity     Assist      Assist Level: Dependent - Patient 0%   Blood pressure (!) 111/96, pulse 85, temperature 98.7 F (37.1 C), resp. rate 17, height 5\' 3"  (1.6 m), weight 66.3 kg, SpO2 96%.  Medical Problem List and Plan: 1. Functional deficits secondary cardiab debility complicated by AKI             -patient may shower             -ELOS/Goals: 8-11 days S             continue CIR  Messaged team to set d/c date-- 01/29/24  Placed order for central line removal  2.  Antithrombotics: -DVT/anticoagulation:  Pharmaceutical: continue Coumadin, INR reviewed and stable             -antiplatelet therapy:   3.  Pain Management: denies pain. Tylenol prn mild pain, d/c oxycodone  4. Mood/Behavior/Sleep: Team to provide ego support. LCSW to follow for evaluation and support.              -d/c seroquel, continue low dose Remeron 7.5mg  at nights. Melatonin prn.              -antipsychotic agents: Seroquel daily at bedtime--d/c'd.  5. Neuropsych/cognition: This patient is capable of making decisions on her own behalf.  6. Skin/Wound Care: routine pressure relief measures. Monitor wound for healing  7. Fluids/Electrolytes/Nutrition:  Monitor I/O. Not on FR at this time             --Renal restrictions added to diet. Changed ensure to nephro             --continue vitamin C  8. Afib/A flutter: Monitor HR TID and for symptoms with activity.  -continue Amiodarone 200 mg daily with coumadin. -Heart rate well-controlled  9. Hypotension: TEDs. Midodrine 20 mg TID. KHT for support. Add fludricortisone 0.05mg  daily, continue, improved   -Blood pressure remains stable, mildly hypotensive  -01/22/24 BPs ok/stable, monitor  Vitals:   01/21/24 1400 01/21/24 1430 01/21/24 1500 01/21/24 1530  BP: 103/74 101/63 99/64 105/72   01/21/24  1600 01/21/24 1630 01/21/24 1700 01/21/24 1730  BP: (!) 88/60 93/69 92/67  101/72   01/21/24 1819 01/21/24 1955 01/22/24 0506 01/22/24 0705  BP: 110/75 90/62 (!) 82/53 (!) 111/96     10. Acute on CKD: Now on HD MWF due to ischemic ATN/shock. Schedule HD at the end of the day to help with tolerance of therapy             --continue Phoslo for now--should improve with renal restrictions.   Continue fluid restriction    11. Acute on chronic anemia: continue Aranesp weekly    12. HFrEF: Strict I/O. Daily weights. Fluid status being managed with HD.   -Weights approximately stable around dialysis [facility] Weights   01/19/24 1707 01/20/24 0602 01/21/24 0541  Weight: 65.3 kg 66.7 kg 66.3 kg    22. Depression: continue Zoloft 25mg  daily, Remeron 7.5mg  daily; seroquel d/c'd. Provide ego support.    23. Constipation: Increase colace to BID, miralax daily, senokot 2 tabs BID -01/22/24 LBM 2 days ago, but has been irregular since starting dialysis; monitor for now  24. Fatigue: improved, fludricortisone restarted due to symptoms concerning for orthostasis  25. Type 2 diabetes: d/c feeding supplement  Improved, decrease CBG checks to daily -01/22/24 CBG decent, ordered as daily but sometimes getting it more often; monitor daily  CBG (last 3)  Recent Labs    01/21/24 2108 01/22/24 0558 01/22/24 1122  GLUCAP 185* 139* 140*     26.  Nausea/vomiting.  Resolved  27. Blurred vision: discussed could be 2/2 hypotension, resolved, fludricortisone restarted  LOS: 10 days A FACE TO FACE EVALUATION WAS PERFORMED  [address] Krisinda Giovanni 01/22/2024, 11:47 AM

## 2024-01-22 NOTE — Progress Notes (Signed)
 PHARMACY - ANTICOAGULATION CONSULT NOTE  Pharmacy Consult for Warfarin Indication: atrial fibrillation  Allergies  Allergen Reactions   Spironolactone     hyperkalemia    Patient Measurements: Height: 5\' 3"  (160 cm) Weight: 66.3 kg (146 lb 2.6 oz) IBW/kg (Calculated) : 52.4 Heparin Dosing Weight: ~ 70 kg  Vital Signs: Temp: 98.7 F (37.1 C) (02/22 0705) BP: 111/96 (02/22 0705) Pulse Rate: 85 (02/22 0705)  Labs: Recent Labs    01/20/24 0448 01/21/24 1040 01/22/24 0943  LABPROT 20.3* 20.7* 21.2*  INR 1.7* 1.8* 1.8*   Estimated Creatinine Clearance: 5.3 mL/min (A) (by C-G formula based on SCr of 8.75 mg/dL (H)).  Assessment: 73 yo female s/p scheduled bioprosthetic mitral valve replacement with tissue valve 12/30 now with afib, pharmacy asked to dose anticoagulation with IV heparin. Spontaneously converted 1/7; flipped back to AF overnight 1/9 0300. Baseline INR 1.1, slow uptrend to 1.3 s/p 6 days warfarin therapy, total dose 20 mg, then held 1/21-24 for tunneled access plans. Patient now s/p [facility] and tunneled central line placement 1/24 AM on CRRT, plan HD MWF.  Off norepinephrine since 1/26, remains on midodrine. Resumed anticoagulation per VIR after tunneled cath placement. Heparin stopped 1/31 with INR >2.  Amiodarone  is known to increase warfarin effect. Although he has been on amiodarone since [facility] acute care admit date 11/29/23, thus should have seen effect by now.    2/22 UPDATE:  INR: 1.8    Goal of Therapy:  INR goal 2 - 3 (bMVR) Monitor platelets by anticoagulation protocol: Yes   Plan:  Coumadin 3 mg x1 dose today.   Daily INR   Thank you  Greta Doom BS, PharmD, BCPS Clinical Pharmacist 01/22/2024 1:12 PM  Contact: [phone] after 3 PM  "Be curious, not judgmental..." -Debbora Dus

## 2024-01-23 LAB — GLUCOSE, CAPILLARY: Glucose-Capillary: 120 mg/dL — ABNORMAL HIGH (ref 70–99)

## 2024-01-23 LAB — PROTIME-INR
INR: 2 — ABNORMAL HIGH (ref 0.8–1.2)
Prothrombin Time: 22.6 s — ABNORMAL HIGH (ref 11.4–15.2)

## 2024-01-23 MED ORDER — WARFARIN SODIUM 3 MG PO TABS
3.0000 mg | ORAL_TABLET | Freq: Once | ORAL | Status: AC
  Administered 2024-01-23: 3 mg via ORAL
  Filled 2024-01-23: qty 1

## 2024-01-23 NOTE — Progress Notes (Signed)
 PHARMACY - ANTICOAGULATION CONSULT NOTE  Pharmacy Consult for Warfarin Indication: atrial fibrillation  Allergies  Allergen Reactions   Spironolactone     hyperkalemia    Patient Measurements: Height: 5\' 3"  (160 cm) Weight: 66.3 kg (146 lb 2.6 oz) IBW/kg (Calculated) : 52.4 Heparin Dosing Weight: ~ 70 kg  Vital Signs: Temp: 98 F (36.7 C) (02/23 0435) BP: 99/58 (02/23 0435) Pulse Rate: 80 (02/23 0435)  Labs: Recent Labs    01/21/24 1040 01/22/24 0943 01/23/24 0915  LABPROT 20.7* 21.2* 22.6*  INR 1.8* 1.8* 2.0*   Estimated Creatinine Clearance: 5.3 mL/min (A) (by C-G formula based on SCr of 8.75 mg/dL (H)).  Assessment: 73 yo female s/p scheduled bioprosthetic mitral valve replacement with tissue valve 12/30 now with afib, pharmacy asked to dose anticoagulation with IV heparin. Spontaneously converted 1/7; flipped back to AF overnight 1/9 0300. Baseline INR 1.1, slow uptrend to 1.3 s/p 6 days warfarin therapy, total dose 20 mg, then held 1/21-24 for tunneled access plans. Patient now s/p [facility] and tunneled central line placement 1/24 AM on CRRT, plan HD MWF.  Off norepinephrine since 1/26, remains on midodrine. Resumed anticoagulation per VIR after tunneled cath placement. Heparin stopped 1/31 with INR >2.  Amiodarone  is known to increase warfarin effect. Although he has been on amiodarone since [facility] acute care admit date 11/29/23, thus should have seen effect by now.    2/23 UPDATE:  INR: 2.0    Goal of Therapy:  INR goal 2 - 3 (bMVR) Monitor platelets by anticoagulation protocol: Yes   Plan:  Coumadin 3 mg x1 dose today.   Daily INR   Thank you  Greta Doom BS, PharmD, BCPS Clinical Pharmacist 01/23/2024 10:30 AM  Contact: [phone] after 3 PM  "Be curious, not judgmental..." -Debbora Dus

## 2024-01-23 NOTE — Progress Notes (Signed)
 Occupational Therapy Session Note  Patient Details  Name: Vanessa [name] MRN: [id number] Date of Birth: 08-09-1951  Today's Date: 01/23/2024 OT Individual Time: 1100-1200 OT Individual Time Calculation (min): 60 min    Short Term Goals: Week 2:  OT Short Term Goal 1 (Week 2): STG = LTG due to ELOS  Skilled Therapeutic Interventions/Progress Updates: Patient received sitting up in w/c. Agreeable to work with OT and in agreement that working on functional transfers will help her meet her LTG's. Patient reports having a couch and a recliner she utilizes at home so patient performed functional transfers from the recliner and couch in the therapy apartment. Patient able to stand from w/c with min/mod assist to walker for 5 foot walk to chair. Patient encouraged to sit as slow as possible and not plop. Patient needed Moderate assist to stand from recliner and agreed that a non moving firm chair will be much easier to get up from at home. The ambulated to the couch and worked on forward wt shift to stand from the couch, also needed Mod A. Patient responded well to vc's encouraging forward wt shift, but confirmed her fear of falling forward if she were to wt shift too far. Worked further on sit to stand from [employer] at varied heights working towards the height of the w/c. Patient able to stand from surface 3" higher with only light CGA. Lowered the mat 1" and patient needed Min assist to stand to the walker with cued to get wt off heels and more towards the mid foot. Patient responded well to work from [employer], but became fatigued towards the end of her session. Assisted back to her room and assisted into the recliner. Continue with skilled OT POC to improve independence with self care skills.     Therapy Documentation Precautions:  Precautions Precautions: Fall, Sternal Restrictions Weight Bearing Restrictions Per Provider Order: No LUE Weight Bearing Per Provider Order: Non weight  bearing Other Position/Activity Restrictions: sternal precautions    Pain:No c/o pain    ADL: ADL Eating: Independent Grooming: Setup Where Assessed-Grooming: Sitting at sink Upper Body Bathing: Supervision/safety Where Assessed-Upper Body Bathing: Sitting at sink Lower Body Bathing: Moderate assistance Where Assessed-Lower Body Bathing: Sitting at sink Upper Body Dressing: Minimal assistance Lower Body Dressing: Moderate assistance Toileting: Maximal assistance Toilet Transfer: Moderate assistance Toilet Transfer Method: Stand pivot     Therapy/Group: Individual Therapy  Warnell Forester 01/23/2024, 12:29 PM

## 2024-01-23 NOTE — Progress Notes (Signed)
 Occupational Therapy Session Note  Patient Details  Name: Vanessa [name] MRN: [id number] Date of Birth: [birth year]  Today's Date: 01/23/2024 OT Individual Time: [phone] OT Individual Time Calculation (min): 60 min    Short Term Goals: Week 1:  OT Short Term Goal 1 (Week 1): Pt will demonstrate improved sit to stand strength to be able to rise to stand from low w/c seat with min A. OT Short Term Goal 1 - Progress (Week 1): Progressing toward goal OT Short Term Goal 2 (Week 1): Pt will be able to stand for at least 3 minutes to engage in LB bathing tasks for bottom and perineal area. OT Short Term Goal 2 - Progress (Week 1): Met OT Short Term Goal 3 (Week 1): Pt will be able to don pull on pants with min A. OT Short Term Goal 3 - Progress (Week 1): Met OT Short Term Goal 4 (Week 1): Pt will complete toilet transfer and ambulation to toilet with RW with CGA. OT Short Term Goal 4 - Progress (Week 1): Met  Skilled Therapeutic Interventions/Progress Updates:    Patient in bed at the time of arrival, patient indicated that she rested well and that she had no pain to report. Patient in agreement with completing BADL related task in bathing at sink LOF.  The pt was able to transfer from supine in bed to EOB with CGA. The pt was able to transfer from EOB to w/c LOF with CGA using the RW for ambulation. The pt was able to wheel herself a short distance to the sink area with close S. The pt completed washing her face and brushing her teeth with s/uA. Patient was able to doff her over head hospital top with MinA, she was able to bathe her UB with s/uA. Patient able to come from sit to stand incorporating the arm of the w/c and the sink with ModA for doffing her pants with MinA for removing them from her feet.  The pt was able to clean her perineal area while seat at w/c LOF with s/u A secondary to prior clean from staff during toileting.  The pt was able to donn her hospital pants with MinA, she required  ModA for coming from sit to stand using the arm of the w/c and the sink surface for additional balance. At the end of the session the pt remained at w/c LOF with the call light and bedside table within reach and all additional needs addressed.   Therapy Documentation Precautions:  Precautions Precautions: Fall, Sternal Restrictions Weight Bearing Restrictions Per Provider Order: No LUE Weight Bearing Per Provider Order: Non weight bearing Other Position/Activity Restrictions: sternal precautions  Therapy/Group: Individual Therapy  Lavona Mound 01/23/2024, 12:14 PM

## 2024-01-23 NOTE — Progress Notes (Signed)
 PROGRESS NOTE   Subjective/Complaints:  Pt doing well, slept well, denies pain, LBM yesterday. Also urinated a little yesterday. Denies any other complaints or concerns today.   ROS:   ROS: Denies fevers, chills,   abdominal pain, SOB, chest pain, new weakness or paraesthesias.   + Nausea/vomiting-intermittent-none voiced +temporary blurred vision today-resolved  Objective:   No results found.   No results for input(s): "WBC", "HGB", "HCT", "PLT" in the last 72 hours.   No results for input(s): "NA", "K", "CL", "CO2", "GLUCOSE", "BUN", "CREATININE", "CALCIUM" in the last 72 hours.    Intake/Output Summary (Last 24 hours) at 01/23/2024 1022 Last data filed at 01/22/2024 1855 Gross per 24 hour  Intake 320 ml  Output --  Net 320 ml     Pressure Injury 01/12/24 Buttocks Left Deep Tissue Pressure Injury - Purple or maroon localized area of discolored intact skin or blood-filled blister due to damage of underlying soft tissue from pressure and/or shear. Purple DTI 2cm round (Active)  01/12/24 1830  Location: Buttocks  Location Orientation: Left  Staging: Deep Tissue Pressure Injury - Purple or maroon localized area of discolored intact skin or blood-filled blister due to damage of underlying soft tissue from pressure and/or shear.  Wound Description (Comments): Purple DTI 2cm round  Present on Admission: Yes    Physical Exam: Vital Signs Blood pressure (!) 99/58, pulse 80, temperature 98 F (36.7 C), resp. rate 17, height 5\' 3"  (1.6 m), weight 66.3 kg, SpO2 98%. Gen: no distress, normal appearing.  Sitting up in w/c with OT.   HEENT: oral mucosa pink and moist, NCAT Cardio: Reg rate and rhythm, no m/r/g appreciated Chest: normal effort, normal rate of breathing, CTAB Abd: soft, normal bowel sounds, non-distended, nonTTP Ext: no edema Psych: pleasant, flat affect Skin: intact; right chest dialysis port C/D/I Neuro:  No focal neurologic deficits.  Awake, alert and oriented x 4.  Moving all 4 extremities antigravity and against resistance.  Touch intact.  Reflexes normal.  No tone, stable 2/23   Assessment/Plan: 1. Functional deficits which require 3+ hours per day of interdisciplinary therapy in a comprehensive inpatient rehab setting. Physiatrist is providing close team supervision and 24 hour management of active medical problems listed below. Physiatrist and rehab team continue to assess barriers to discharge/monitor patient progress toward functional and medical goals  Care Tool:  Bathing    Body parts bathed by patient: Right arm, Left arm, Chest, Abdomen, Front perineal area, Right upper leg, Left upper leg, Face, Right lower leg, Left lower leg   Body parts bathed by helper: Left lower leg, Right lower leg, Buttocks     Bathing assist Assist Level: Minimal Assistance - Patient > 75%     Upper Body Dressing/Undressing Upper body dressing   What is the patient wearing?: Pull over shirt    Upper body assist Assist Level: Supervision/Verbal cueing    Lower Body Dressing/Undressing Lower body dressing      What is the patient wearing?: Pants     Lower body assist Assist for lower body dressing: Contact Guard/Touching assist     Toileting Toileting    Toileting assist Assist for toileting: Maximal Assistance -  Patient 25 - 49%     Transfers Chair/bed transfer  Transfers assist     Chair/bed transfer assist level: Contact Guard/Touching assist     Locomotion Ambulation   Ambulation assist      Assist level: Minimal Assistance - Patient > 75% Assistive device: Walker-rolling Max distance: 30   Walk 10 feet activity   Assist     Assist level: Minimal Assistance - Patient > 75% Assistive device: Walker-rolling   Walk 50 feet activity   Assist Walk 50 feet with 2 turns activity did not occur: Safety/medical concerns (fatigue)  Assist level: Minimal  Assistance - Patient > 75% Assistive device: Walker-rolling    Walk 150 feet activity   Assist Walk 150 feet activity did not occur: Safety/medical concerns (fatigue)         Walk 10 feet on uneven surface  activity   Assist Walk 10 feet on uneven surfaces activity did not occur: Safety/medical concerns (fatigue)         Wheelchair     Assist Is the patient using a wheelchair?: Yes Type of Wheelchair: Manual    Wheelchair assist level: Dependent - Patient 0% Max wheelchair distance: 33ft    Wheelchair 50 feet with 2 turns activity    Assist        Assist Level: Dependent - Patient 0%   Wheelchair 150 feet activity     Assist      Assist Level: Dependent - Patient 0%   Blood pressure (!) 99/58, pulse 80, temperature 98 F (36.7 C), resp. rate 17, height 5\' 3"  (1.6 m), weight 66.3 kg, SpO2 98%.  Medical Problem List and Plan: 1. Functional deficits secondary cardiab debility complicated by AKI             -patient may shower             -ELOS/Goals: 8-11 days S             continue CIR  Messaged team to set d/c date-- 01/29/24  Placed order for central line removal  2.  Antithrombotics: -DVT/anticoagulation:  Pharmaceutical: continue Coumadin, INR reviewed and stable             -antiplatelet therapy:   3. Pain Management: denies pain. Tylenol prn mild pain, d/c oxycodone  4. Mood/Behavior/Sleep: Team to provide ego support. LCSW to follow for evaluation and support.              -d/c seroquel, continue low dose Remeron 7.5mg  at nights. Melatonin prn.              -antipsychotic agents: Seroquel daily at bedtime--d/c'd.  5. Neuropsych/cognition: This patient is capable of making decisions on her own behalf.  6. Skin/Wound Care: routine pressure relief measures. Monitor wound for healing  7. Fluids/Electrolytes/Nutrition:  Monitor I/O. Not on FR at this time             --Renal restrictions added to diet. Changed ensure to nephro              --continue vitamin C  8. Afib/A flutter: Monitor HR TID and for symptoms with activity.  -continue Amiodarone 200 mg daily with coumadin. -Heart rate well-controlled  9. Hypotension: TEDs. Midodrine 20 mg TID. KHT for support. Add fludricortisone 0.05mg  daily, continue, improved   -Blood pressure remains stable, mildly hypotensive  -2/22-23/25 BPs ok/stable, monitor  Vitals:   01/21/24 1530 01/21/24 1600 01/21/24 1630 01/21/24 1700  BP: 105/72 (!) 88/60 93/69 92/67  01/21/24 1730 01/21/24 1819 01/21/24 1955 01/22/24 0506  BP: 101/72 110/75 90/62 (!) 82/53   01/22/24 0705 01/22/24 1604 01/22/24 1900 01/23/24 0435  BP: (!) 111/96 117/78 (!) 91/59 (!) 99/58     10. Acute on CKD: Now on HD MWF due to ischemic ATN/shock. Schedule HD at the end of the day to help with tolerance of therapy             --continue Phoslo for now--should improve with renal restrictions.   Continue fluid restriction    11. Acute on chronic anemia: continue Aranesp weekly    12. HFrEF: Strict I/O. Daily weights. Fluid status being managed with HD.   -Weights approximately stable around dialysis [facility] Weights   01/19/24 1707 01/20/24 0602 01/21/24 0541  Weight: 65.3 kg 66.7 kg 66.3 kg    22. Depression: continue Zoloft 25mg  daily, Remeron 7.5mg  daily; seroquel d/c'd. Provide ego support.    23. Constipation: Increase colace to BID, miralax daily, senokot 2 tabs BID -01/23/24 LBM yesterday, cont regimen  24. Fatigue: improved, fludricortisone restarted due to symptoms concerning for orthostasis  25. Type 2 diabetes: d/c feeding supplement  Improved, decrease CBG checks to daily -01/22/24 CBG decent, ordered as daily but sometimes getting it more often; monitor daily -01/23/24 CBGs still fine, advised nursing about DAILY checks  CBG (last 3)  Recent Labs    01/22/24 1651 01/22/24 2100 01/23/24 0604  GLUCAP 123* 154* 120*     26.  Nausea/vomiting.  Resolved  27. Blurred vision:  discussed could be 2/2 hypotension, resolved, fludricortisone restarted  LOS: 11 days A FACE TO FACE EVALUATION WAS PERFORMED  [address] 01/23/2024, 10:22 AM

## 2024-01-24 LAB — BASIC METABOLIC PANEL
Anion gap: 20 — ABNORMAL HIGH (ref 5–15)
BUN: 50 mg/dL — ABNORMAL HIGH (ref 8–23)
CO2: 21 mmol/L — ABNORMAL LOW (ref 22–32)
Calcium: 9.7 mg/dL (ref 8.9–10.3)
Chloride: 86 mmol/L — ABNORMAL LOW (ref 98–111)
Creatinine, Ser: 7.86 mg/dL — ABNORMAL HIGH (ref 0.44–1.00)
GFR, Estimated: 5 mL/min — ABNORMAL LOW (ref >60)
Glucose, Bld: 114 mg/dL — ABNORMAL HIGH (ref 70–99)
Potassium: 4.2 mmol/L (ref 3.5–5.1)
Sodium: 127 mmol/L — ABNORMAL LOW (ref 135–145)

## 2024-01-24 LAB — PROTIME-INR
INR: 1.7 — ABNORMAL HIGH (ref 0.8–1.2)
Prothrombin Time: 20.3 s — ABNORMAL HIGH (ref 11.4–15.2)

## 2024-01-24 LAB — GLUCOSE, CAPILLARY: Glucose-Capillary: 102 mg/dL — ABNORMAL HIGH (ref 70–99)

## 2024-01-24 MED ORDER — SENNOSIDES-DOCUSATE SODIUM 8.6-50 MG PO TABS: 2.0000 | ORAL_TABLET | Freq: Every day | ORAL | Status: DC

## 2024-01-24 MED ORDER — WARFARIN SODIUM 4 MG PO TABS
4.0000 mg | ORAL_TABLET | Freq: Once | ORAL | Status: AC
  Administered 2024-01-24: 4 mg via ORAL
  Filled 2024-01-24: qty 1

## 2024-01-24 MED ORDER — HEPARIN SODIUM (PORCINE) 1000 UNIT/ML IJ SOLN
INTRAMUSCULAR | Status: AC
Start: 2024-01-24 — End: 2024-01-24
  Administered 2024-01-24: 1000 [IU]
  Filled 2024-01-24: qty 4

## 2024-01-24 NOTE — Progress Notes (Signed)
 Physical Therapy Session Note  Patient Details  Name: Vanessa [name] MRN: [id number] Date of Birth: Apr 24, 1951  Today's Date: 01/24/2024 PT Individual Time: 4098-1191 PT Individual Time Calculation (min): 70 min   Short Term Goals: Week 1:  PT Short Term Goal 1 (Week 1): pt will transfer supine<>sitting EOB with CGA PT Short Term Goal 1 - Progress (Week 1): Met PT Short Term Goal 2 (Week 1): pt will transfer sit<>stand with LRAD and min A consistantly PT Short Term Goal 2 - Progress (Week 1): Progressing toward goal PT Short Term Goal 3 (Week 1): pt will ambulate 24ft with LRAD and CGA PT Short Term Goal 3 - Progress (Week 1): Met Week 2:  PT Short Term Goal 1 (Week 2): STG=LTG due to LOS  Skilled Therapeutic Interventions/Progress Updates:   Received pt sitting in WC, pt agreeable to PT treatment, and denied any pain. Session with emphasis on functional mobility/transfers, sit<>stands, generalized strengthening and endurance, dynamic standing balance/coordination, stair navigation, and gait training. Pt transported to/from room in [facility] dependently for time management purposes.   Stood from [facility] at staircase with heavy min A and navigated 8 3in steps with bilateral handrails and CGA alternating using a step to and step through pattern. At bottom of stairs pt reported feeling dizzy - required min A to safely transition off steps and sit in WC. Pt then reported increased nausea - provided pt with emesis bag and BP assessed: 114/83 - symptoms improved with rest. Stood at staircase and attempted to navigate 6in steps - pt able to get RLE onto step but reported feeling too weak to step up and get LLE onto step.   Stood from [facility] with RW and heavy mod A and ambulated 68ft with RW and CGA fading to close supervision - limited by fatigue. During rest breaks, discussed D/C plan - pt reports her sister will stay with her 24/7 to assist and pt has ramp to enter front door with railing on R side. Pt reports her  couch at home sits low - recommended placing pillows on it to boost height or sitting in a different chair. Pt reports her kitchen chairs are taller - found chair to simulate height and pt stood from [facility] with RW and mod A and ambulated 96ft to standard chair with armrests. Stood from standard chair with RW and mod A and ambulated back to [facility] - pt reporting 8/10 fatigue stating "I don't know if I'm going to have enough energy for [facility]".   Returned to room and performed the following seated exercises with emphasis on LE strength/ROM: -knee extensions 2x15 bilaterally -hip flexion 2x15 bilaterally -hip abduction with red TB 2x20 -heel/toe raises 2x20 bilaterally Concluded session with pt sitting in WC, needs within reach, and chair pad alarm on.   Therapy Documentation Precautions:  Precautions Precautions: Fall, Sternal Restrictions Weight Bearing Restrictions Per Provider Order: No LUE Weight Bearing Per Provider Order: Non weight bearing Other Position/Activity Restrictions: sternal precautions  Therapy/Group: Individual Therapy Marlana Salvage Zaunegger Blima Rich PT, DPT 01/24/2024, 6:55 AM

## 2024-01-24 NOTE — Progress Notes (Signed)
 Occupational Therapy Session Note  Patient Details  Name: Vanessa [name] MRN: [id number] Date of Birth: 11-12-1951  Today's Date: 01/24/2024 OT Individual Time: 4098-1191 & 1105-1200 OT Individual Time Calculation (min): 55 min & 55 min   Short Term Goals: Week 2:  OT Short Term Goal 1 (Week 2): STG = LTG due to ELOS  Skilled Therapeutic Interventions/Progress Updates:  Session 1 Skilled OT intervention completed with focus on ADL retraining, functional ambulation and endurance. Pt received upright in bed, agreeable to session. No pain reported. Requested to wash up.  Transitioned to EOB with supervision and + time for scooting forward. Per pt request, OT applied vaseline to BLE then donned knee high TEDs with mod A. From slightly elevated bed, pt stood with min fading to CGA using RW with cues provided for nose over toes. Ambulated with RW and CGA about 10 ft > w/c at sink. Pt doffed clothing with CGA.   Seated at sink, pt was able to wash UB, thighs and front periarea with set up A, dress UB with set up A, and donn LB with + time and cues needed to form the 2 holes then put pants on floor to step in. Mod A needed to stand with 1 hand on RW then able to donn over hips with CGA.   Discussed with pt the need to do family ed with her sister/those planning to care give so they are aware of how she is doing and mobilizing. Pt states her sister who is local should be able to attend between now and DC day; CSW notified of need for arrangement. Nurse present for morning meds.   Pt remained seated in w/c, with chair alarm on/activated, and with all needs in reach at end of session.  Session 2 Skilled OT intervention completed with focus on functional transfers, BUE/core strengthening, activity tolerance. Pt received seated in w/c, agreeable to session. No pain reported.  Pt declined self-care needs. Transported dependently in w/c <> gym.  Required 2 attempts, then able to stand heavy mod A due  to fatigue with RW and cues for nose over toes. CGA ambulatory transfer with > EOM with RW.  Seated EOM, pt completed the following BUE/core exercises to promote strength/endurance needed for independence with functional transfers and BADLs: (With 3 lb dowel) -2x20 eccentric elbow extension -3x20 bicep curls (With 2 lb med ball) -2x15 modified crunches  Min A sit > stand with RW from elevated mat, then CGA stand pivot with RW > w/c, then mod A sit > stand from w/c with RW and CGA ambulatory transfer > EOB.   Education provided on gait belt for leg lifter technique as pt has standard bed at home. Max cues needed and overall min A needed for LLE as though pt got RLE into bed with gait belt, pt laid all the way down due to fatigue stating "just get my leg in bed." Would benefit from continued practice.  Pt remained semi upright in bed, with bed alarm on/activated, and with all needs in reach at end of session.   Therapy Documentation Precautions:  Precautions Precautions: Fall, Sternal Restrictions Weight Bearing Restrictions Per Provider Order: No Other Position/Activity Restrictions: sternal precautions    Therapy/Group: Individual Therapy  [name], MS, OTR/L  01/24/2024, 12:04 PM

## 2024-01-24 NOTE — Procedures (Signed)
 I was present at this dialysis session. I have reviewed the session itself and made appropriate changes.   Filed Weights   01/20/24 0602 01/21/24 0541 01/24/24 0505  Weight: 66.7 kg 66.3 kg 65.7 kg    Recent Labs  Lab 01/18/24 1333 01/24/24 1258  NA 128* 127*  K 4.1 4.2  CL 88* 86*  CO2 21* 21*  GLUCOSE 151* 114*  BUN 55* 50*  CREATININE 8.75* 7.86*  CALCIUM 9.2 9.7  PHOS 3.6  --     Recent Labs  Lab 01/18/24 1333  WBC 9.5  HGB 11.5*  HCT 35.4*  MCV 95.2  PLT 150    Scheduled Meds:  amiodarone  200 mg Oral Daily   ascorbic acid  250 mg Oral BID   Chlorhexidine Gluconate Cloth  6 each Topical Q12H   cholecalciferol  1,000 Units Oral Daily   docusate sodium  100 mg Oral BID   fludrocortisone  0.05 mg Oral Daily   Gerhardt's butt cream   Topical BID   lanthanum  500 mg Oral TID WC   midodrine  20 mg Oral TID WC   mirtazapine  7.5 mg Oral QHS   multivitamin  1 tablet Oral BID   polyethylene glycol  17 g Oral Daily   [START ON 01/25/2024] senna-docusate  2 tablet Oral QHS   sertraline  25 mg Oral Daily   sodium chloride flush  10-40 mL Intracatheter Q12H   warfarin  4 mg Oral ONCE-1600   Warfarin - Pharmacist Dosing Inpatient   Does not apply q1600   Continuous Infusions:  anticoagulant sodium citrate     PRN Meds:.acetaminophen, anticoagulant sodium citrate, artificial tears, diphenhydrAMINE, guaiFENesin-dextromethorphan, levalbuterol, lidocaine (PF), lidocaine, lidocaine-prilocaine, lip balm, melatonin, ondansetron, mouth rinse, pentafluoroprop-tetrafluoroeth, polyethylene glycol, prochlorperazine **OR** [DISCONTINUED] prochlorperazine **OR** [DISCONTINUED] prochlorperazine, simethicone, sodium chloride, sodium chloride flush, sorbitol   Anthony Sar, MD [facility] 01/24/2024, 3:10 PM

## 2024-01-24 NOTE — Progress Notes (Signed)
 PROGRESS NOTE   Subjective/Complaints: No new complaints this morning Discussed discontinuing CBG checks, provided list of foods to keep blood sugars under good control  ROS:   ROS: Denies fevers, chills,   abdominal pain, SOB, chest pain, new weakness or paraesthesias.   + Nausea/vomiting-intermittent-none voiced +temporary blurred vision today-resolved  Objective:   No results found.   No results for input(s): "WBC", "HGB", "HCT", "PLT" in the last 72 hours.   No results for input(s): "NA", "K", "CL", "CO2", "GLUCOSE", "BUN", "CREATININE", "CALCIUM" in the last 72 hours.    Intake/Output Summary (Last 24 hours) at 01/24/2024 0948 Last data filed at 01/24/2024 0830 Gross per 24 hour  Intake 125 ml  Output --  Net 125 ml     Pressure Injury 01/12/24 Buttocks Left Deep Tissue Pressure Injury - Purple or maroon localized area of discolored intact skin or blood-filled blister due to damage of underlying soft tissue from pressure and/or shear. Purple DTI 2cm round (Active)  01/12/24 1830  Location: Buttocks  Location Orientation: Left  Staging: Deep Tissue Pressure Injury - Purple or maroon localized area of discolored intact skin or blood-filled blister due to damage of underlying soft tissue from pressure and/or shear.  Wound Description (Comments): Purple DTI 2cm round  Present on Admission: Yes    Physical Exam: Vital Signs Blood pressure 97/67, pulse 79, temperature 97.9 F (36.6 C), resp. rate 18, height 5\' 3"  (1.6 m), weight 65.7 kg, SpO2 98%. Gen: no distress, normal appearing.  Sitting up in w/c with OT.   HEENT: oral mucosa pink and moist, NCAT Cardio: Reg rate and rhythm, no m/r/g appreciated Chest: normal effort, normal rate of breathing, CTAB Abd: soft, normal bowel sounds, non-distended, nonTTP Ext: no edema, +asterixis Psych: pleasant, flat affect Skin: intact; right chest dialysis port  C/D/I Neuro: No focal neurologic deficits.  Awake, alert and oriented x 4.  Moving all 4 extremities antigravity and against resistance.  Touch intact.  Reflexes normal.  No tone, stable 2/23   Assessment/Plan: 1. Functional deficits which require 3+ hours per day of interdisciplinary therapy in a comprehensive inpatient rehab setting. Physiatrist is providing close team supervision and 24 hour management of active medical problems listed below. Physiatrist and rehab team continue to assess barriers to discharge/monitor patient progress toward functional and medical goals  Care Tool:  Bathing    Body parts bathed by patient: Right arm, Left arm, Chest, Abdomen, Front perineal area, Right upper leg, Left upper leg, Face, Right lower leg, Left lower leg   Body parts bathed by helper: Left lower leg, Right lower leg, Buttocks     Bathing assist Assist Level: Minimal Assistance - Patient > 75%     Upper Body Dressing/Undressing Upper body dressing   What is the patient wearing?: Pull over shirt    Upper body assist Assist Level: Supervision/Verbal cueing    Lower Body Dressing/Undressing Lower body dressing      What is the patient wearing?: Pants     Lower body assist Assist for lower body dressing: Contact Guard/Touching assist     Toileting Toileting    Toileting assist Assist for toileting: Maximal Assistance - Patient 25 -  49%     Transfers Chair/bed transfer  Transfers assist     Chair/bed transfer assist level: Contact Guard/Touching assist     Locomotion Ambulation   Ambulation assist      Assist level: Minimal Assistance - Patient > 75% Assistive device: Walker-rolling Max distance: 30   Walk 10 feet activity   Assist     Assist level: Minimal Assistance - Patient > 75% Assistive device: Walker-rolling   Walk 50 feet activity   Assist Walk 50 feet with 2 turns activity did not occur: Safety/medical concerns (fatigue)  Assist level:  Minimal Assistance - Patient > 75% Assistive device: Walker-rolling    Walk 150 feet activity   Assist Walk 150 feet activity did not occur: Safety/medical concerns (fatigue)         Walk 10 feet on uneven surface  activity   Assist Walk 10 feet on uneven surfaces activity did not occur: Safety/medical concerns (fatigue)         Wheelchair     Assist Is the patient using a wheelchair?: Yes Type of Wheelchair: Manual    Wheelchair assist level: Dependent - Patient 0% Max wheelchair distance: 75ft    Wheelchair 50 feet with 2 turns activity    Assist        Assist Level: Dependent - Patient 0%   Wheelchair 150 feet activity     Assist      Assist Level: Dependent - Patient 0%   Blood pressure 97/67, pulse 79, temperature 97.9 F (36.6 C), resp. rate 18, height 5\' 3"  (1.6 m), weight 65.7 kg, SpO2 98%.  Medical Problem List and Plan: 1. Functional deficits secondary cardiab debility complicated by AKI             -patient may shower             -ELOS/Goals: 8-11 days S             continue CIR  Messaged team to set d/c date-- 01/29/24  Placed order for central line removal  2.  Antithrombotics: -DVT/anticoagulation:  Pharmaceutical: continue Coumadin, INR reviewed and stable             -antiplatelet therapy:   3. Pain Management: denies pain. Tylenol prn mild pain, d/c oxycodone  4. Mood/Behavior/Sleep: Team to provide ego support. LCSW to follow for evaluation and support.              -d/c seroquel, continue low dose Remeron 7.5mg  at nights. Melatonin prn.              -antipsychotic agents: Seroquel daily at bedtime--d/c'd.  5. Neuropsych/cognition: This patient is capable of making decisions on her own behalf.  6. Skin/Wound Care: routine pressure relief measures. Monitor wound for healing  7. Fluids/Electrolytes/Nutrition:  Monitor I/O. Not on FR at this time             --Renal restrictions added to diet. Changed ensure to nephro              --continue vitamin C  8. Afib/A flutter: Monitor HR TID and for symptoms with activity.  -continue Amiodarone 200 mg daily with coumadin. -Heart rate well-controlled  9. Hypotension: TEDs. Midodrine 20 mg TID. KHT for support. Add fludricortisone 0.05mg  daily, continue, improved   -Blood pressure remains stable, mildly hypotensive  -2/22-23/25 BPs ok/stable, monitor  Vitals:   01/21/24 1700 01/21/24 1730 01/21/24 1819 01/21/24 1955  BP: 92/67 101/72 110/75 90/62   01/22/24 0506 01/22/24 1610  01/22/24 1604 01/22/24 1900  BP: (!) 82/53 (!) 111/96 117/78 (!) 91/59   01/23/24 0435 01/23/24 1516 01/23/24 2010 01/24/24 0505  BP: (!) 99/58 92/62 97/61  97/67     10. Acute on CKD: Now on HD MWF due to ischemic ATN/shock. Schedule HD at the end of the day to help with tolerance of therapy             --continue Phoslo for now--should improve with renal restrictions.   Continue fluid restriction    11. Acute on chronic anemia: continue Aranesp weekly    12. HFrEF: Strict I/O. Daily weights. Fluid status being managed with HD.   Reviewed and weights decreased Filed Weights   01/20/24 0602 01/21/24 0541 01/24/24 0505  Weight: 66.7 kg 66.3 kg 65.7 kg    22. Depression: continue Zoloft 25mg  daily, Remeron 7.5mg  daily; seroquel d/c'd. Provide ego support.    23. Constipation: Increase colace to BID, miralax daily, decrease senna docusate to HS  24. Fatigue: improved, fludricortisone restarted due to symptoms concerning for orthostasis, continue  25. Type 2 diabetes: d/c feeding supplement  Improved, d/c CBGs  CBG (last 3)  Recent Labs    01/22/24 2100 01/23/24 0604 01/24/24 0602  GLUCAP 154* 120* 102*     26.  Nausea/vomiting.  Resolved  27. Blurred vision: discussed could be 2/2 hypotension, resolved, fludricortisone restarted  LOS: 12 days A FACE TO FACE EVALUATION WAS PERFORMED  Vanessa [name] Algenis Ballin 01/24/2024, 9:48 AM

## 2024-01-24 NOTE — Progress Notes (Signed)
   01/24/24 1823  Vitals  Temp 97.8 F (36.6 C)  Pulse Rate 81  Resp 17  BP 109/68  SpO2 100 %  O2 Device Room Air  Weight (S)  69 kg (Bed Scale)  Type of Weight Post-Dialysis  Oxygen Therapy  Patient Activity (if Appropriate) In bed  Pulse Oximetry Type Continuous  During Treatment Monitoring  Blood Flow Rate (mL/min) 199 mL/min  Arterial Pressure (mmHg) -67.88 mmHg  Venous Pressure (mmHg) 74.14 mmHg  TMP (mmHg) -1.21 mmHg  Ultrafiltration Rate (mL/min) 0 mL/min  Dialysate Flow Rate (mL/min) 299 ml/min  Dialysate Potassium Concentration 3  Dialysate Calcium Concentration 2.5  Duration of HD Treatment -hour(s) 3 hour(s)  Cumulative Fluid Removed (mL) per Treatment  541.81  HD Safety Checks Performed Yes  Intra-Hemodialysis Comments Tx completed;Tolerated well   Received patient in bed to unit.  Alert and oriented.  Informed consent signed and in chart.   TX duration:  3  Patient tolerated well.  Transported back to the room  Alert, without acute distress.  Hand-off given to patient's nurse.   Access used: Yes Access issues: No  Total UF removed: 500 Medication(s) given: See MAR Post HD VS: See Above Grid Post HD weight: 69 kg   [facility]

## 2024-01-24 NOTE — Progress Notes (Signed)
 PHARMACY - ANTICOAGULATION CONSULT NOTE  Pharmacy Consult for Warfarin Indication: atrial fibrillation  Allergies  Allergen Reactions   Spironolactone     hyperkalemia    Patient Measurements: Height: 5\' 3"  (160 cm) Weight: 65.7 kg (144 lb 13.5 oz) IBW/kg (Calculated) : 52.4  Vital Signs: Temp: 97.9 F (36.6 C) (02/24 0505) BP: 97/67 (02/24 0505) Pulse Rate: 79 (02/24 0505)  Labs: Recent Labs    01/22/24 0943 01/23/24 0915 01/24/24 0628  LABPROT 21.2* 22.6* 20.3*  INR 1.8* 2.0* 1.7*    Estimated Creatinine Clearance: 5.3 mL/min (A) (by C-G formula based on SCr of 8.75 mg/dL (H)).  Assessment: 73 yo female s/p scheduled bioprosthetic mitral valve replacement with tissue valve 12/30 now with afib, pharmacy asked to dose anticoagulation with IV heparin while inpatient. Spontaneously converted 1/7; flipped back to AF overnight 1/9 0300. Baseline INR 1.1, slow uptrend to 1.3 s/p 6 days warfarin therapy, total dose 20 mg, then held 1/21-24 for tunneled access plans. Patient now s/p [facility] and tunneled central line placement 1/24 AM on CRRT, plan HD MWF. Off norepinephrine since 1/26, remains on midodrine. Resumed anticoagulation per VIR after tunneled cath placement. Heparin stopped 1/31 with INR >2.   INR subtherapeutic (1.7) today after 3 mg Warfarin dose on 2/23 when INR 2.0. INR had been therapeutic on 3 mg daily, then INR trended up to 3.0.3.1 on 2/14 thru 2/16  and warfarin doses reduced. INR then subtherapeutic 1.7 on 2/20 and doses increased to 3-4 mg.  Last CBC 2/18, Hgb 11.5, platelet count 150.  Continues on Amiodarone, which can increase sensitivity to warfarin, but on amiodarone prior to warfarin initiation.   Goal of Therapy:  INR 2-3 (bMVR) Monitor platelets by anticoagulation protocol: Yes   Plan:  Warfarin 4 mg x 1 today. Daily PT/INR; decrease frequency when stable. Weekly CBC in am.  Dennie Fetters, RPh 01/24/2024,9:16 AM

## 2024-01-24 NOTE — Progress Notes (Signed)
 [facility] NEPHROLOGY PROGRESS NOTE  Assessment/ Plan: Pt is a 73 y.o. yo female     1.  AKI on CKD 3 yea: ATN from shock.  Maintain dialysis on MWF schedule.  Accepted to [facility] MWF 12:10 pm chair time. Pt will need to arrive at 11:15 am for paperwork on first day.   2.  Severe mitral regurgitation/tricuspid regurgitation: Status post repair with postoperative cardiac tamponade.  Additional management per cardiothoracic surgery. Stable at this time  3.  Acute exacerbation of congestive heart failure with reduced ejection fraction: Volume status significantly improved with ongoing hemodialysis/ultrafiltration.  4.  Shock: From a combination of cardiogenic and vasoplegic etiology-currently on midodrine after previously requiring pressor/inotropic support. Now on high dose midodrine.  Would give before therapy  5.  Hyperphosphatemia: Last phosphorus at goal. Cont binder  6. Anemia of CKD; last Hb at goal.   Subjective: Patient seen and examined bedside.  No acute events, no complaints.  She is hopeful for renal recovery  Objective Vital signs in last 24 hours: Vitals:   01/23/24 0435 01/23/24 1516 01/23/24 2010 01/24/24 0505  BP: (!) 99/58 92/62 97/61  97/67  Pulse: 80 82 85 79  Resp: 17 16 18 18   Temp: 98 F (36.7 C) 98.7 F (37.1 C) 97.7 F (36.5 C) 97.9 F (36.6 C)  TempSrc:      SpO2: 98% 98% 97% 98%  Weight:    65.7 kg  Height:       Weight change:   Intake/Output Summary (Last 24 hours) at 01/24/2024 1231 Last data filed at 01/24/2024 1000 Gross per 24 hour  Intake 245 ml  Output --  Net 245 ml       Labs: RENAL PANEL Recent Labs  Lab 01/18/24 1333  NA 128*  K 4.1  CL 88*  CO2 21*  GLUCOSE 151*  BUN 55*  CREATININE 8.75*  CALCIUM 9.2  PHOS 3.6  ALBUMIN 3.0*    Liver Function Tests: Recent Labs  Lab 01/18/24 1333  ALBUMIN 3.0*   No results for input(s): "LIPASE", "AMYLASE" in the last 168 hours. No results for input(s): "AMMONIA"  in the last 168 hours. CBC: Recent Labs    01/09/24 0350 01/10/24 0500 01/12/24 0605 01/15/24 1304 01/18/24 1333  HGB 11.4* 11.1* 11.8* 12.4 11.5*  MCV 98.9 98.0 97.9 96.1 95.2    Cardiac Enzymes: No results for input(s): "CKTOTAL", "CKMB", "CKMBINDEX", "TROPONINI" in the last 168 hours. CBG: Recent Labs  Lab 01/22/24 1122 01/22/24 1651 01/22/24 2100 01/23/24 0604 01/24/24 0602  GLUCAP 140* 123* 154* 120* 102*    Iron Studies: No results for input(s): "IRON", "TIBC", "TRANSFERRIN", "FERRITIN" in the last 72 hours. Studies/Results: No results found.    Medications: Infusions:  anticoagulant sodium citrate      Scheduled Medications:  amiodarone  200 mg Oral Daily   ascorbic acid  250 mg Oral BID   Chlorhexidine Gluconate Cloth  6 each Topical Q12H   cholecalciferol  1,000 Units Oral Daily   docusate sodium  100 mg Oral BID   fludrocortisone  0.05 mg Oral Daily   Gerhardt's butt cream   Topical BID   lanthanum  500 mg Oral TID WC   midodrine  20 mg Oral TID WC   mirtazapine  7.5 mg Oral QHS   multivitamin  1 tablet Oral BID   polyethylene glycol  17 g Oral Daily   [START ON 01/25/2024] senna-docusate  2 tablet Oral QHS   sertraline  25 mg  Oral Daily   sodium chloride flush  10-40 mL Intracatheter Q12H   warfarin  4 mg Oral ONCE-1600   Warfarin - Pharmacist Dosing Inpatient   Does not apply q1600    have reviewed scheduled and prn medications.  Physical Exam: General:NAD, comfortable Heart:normal rate, no rub Lungs: Bilateral chest rise with no increased work of breathing Abdomen:soft, Non-tender, non-distended Extremities: No edema at the bilateral ankles, warm and well-perfused Dialysis Access: Right IJ TDC in place  Vanessa [name] 01/24/2024,12:31 PM  LOS: 12 days

## 2024-01-25 DIAGNOSIS — I130 Hypertensive heart and chronic kidney disease with heart failure and stage 1 through stage 4 chronic kidney disease, or unspecified chronic kidney disease: Secondary | ICD-10-CM | POA: Insufficient documentation

## 2024-01-25 LAB — CBC
HCT: 37.1 % (ref 36.0–46.0)
Hemoglobin: 11.9 g/dL — ABNORMAL LOW (ref 12.0–15.0)
MCH: 30.1 pg (ref 26.0–34.0)
MCHC: 32.1 g/dL (ref 30.0–36.0)
MCV: 93.7 fL (ref 80.0–100.0)
Platelets: 93 10*3/uL — ABNORMAL LOW (ref 150–400)
RBC: 3.96 MIL/uL (ref 3.87–5.11)
RDW: 16 % — ABNORMAL HIGH (ref 11.5–15.5)
WBC: 9.1 10*3/uL (ref 4.0–10.5)
nRBC: 0 % (ref 0.0–0.2)

## 2024-01-25 LAB — PROTIME-INR
INR: 1.9 — ABNORMAL HIGH (ref 0.8–1.2)
Prothrombin Time: 22.2 s — ABNORMAL HIGH (ref 11.4–15.2)

## 2024-01-25 LAB — GLUCOSE, CAPILLARY
Glucose-Capillary: 103 mg/dL — ABNORMAL HIGH (ref 70–99)
Glucose-Capillary: 148 mg/dL — ABNORMAL HIGH (ref 70–99)

## 2024-01-25 MED ORDER — WARFARIN SODIUM 4 MG PO TABS
4.0000 mg | ORAL_TABLET | Freq: Once | ORAL | Status: AC
  Administered 2024-01-25: 4 mg via ORAL
  Filled 2024-01-25: qty 1

## 2024-01-25 MED ORDER — SENNOSIDES-DOCUSATE SODIUM 8.6-50 MG PO TABS
1.0000 | ORAL_TABLET | Freq: Every day | ORAL | Status: DC
  Administered 2024-01-26 – 2024-01-27 (×2): 1 via ORAL
  Filled 2024-01-25 (×3): qty 1

## 2024-01-25 MED ORDER — SODIUM CHLORIDE 0.9 % IV BOLUS
250.0000 mL | Freq: Once | INTRAVENOUS | Status: AC
  Administered 2024-01-25: 250 mL via INTRAVENOUS

## 2024-01-25 NOTE — Plan of Care (Signed)
  Problem: Sit to Stand Goal: LTG:  Patient will perform sit to stand with assistance level (PT) Description: LTG:  Patient will perform sit to stand with assistance level (PT) Flowsheets (Taken 01/25/2024 0715) LTG: PT will perform sit to stand in preparation for functional mobility with assistance level: (downgraded due to weakness/deconditoning) Contact Guard/Touching assist Note: downgraded due to weakness/deconditoning    Problem: RH Car Transfers Goal: LTG Patient will perform car transfers with assist (PT) Description: LTG: Patient will perform car transfers with assistance (PT). Flowsheets (Taken 01/25/2024 0715) LTG: Pt will perform car transfers with assist:: (downgraded due to weakness/deconditoning) Minimal Assistance - Patient > 75% Note: downgraded due to weakness/deconditoning

## 2024-01-25 NOTE — Progress Notes (Signed)
 Discussed Low BP since HD w/Dr. Arrie Aran. ProAmatine given early this am. He concurred with small bolus of 250 cc.

## 2024-01-25 NOTE — Plan of Care (Signed)
  Problem: Consults Goal: RH GENERAL PATIENT EDUCATION Description: See Patient Education module for education specifics. Outcome: Progressing   Problem: RH BOWEL ELIMINATION Goal: RH STG MANAGE BOWEL WITH ASSISTANCE Description: STG Manage Bowel with toileting Assistance. Outcome: Progressing Goal: RH STG MANAGE BOWEL W/MEDICATION W/ASSISTANCE Description: STG Manage Bowel with Medication with mod I Assistance. Outcome: Progressing   Problem: RH SKIN INTEGRITY Goal: RH STG SKIN FREE OF INFECTION/BREAKDOWN Description: Manage skin w min assist Outcome: Progressing Goal: RH STG MAINTAIN SKIN INTEGRITY WITH ASSISTANCE Description: STG Maintain Skin Integrity With min  Assistance. Outcome: Progressing Goal: RH STG ABLE TO PERFORM INCISION/WOUND CARE W/ASSISTANCE Description: STG Able To Perform Incision/Wound Care With min Assistance. Outcome: Progressing   Problem: RH SAFETY Goal: RH STG ADHERE TO SAFETY PRECAUTIONS W/ASSISTANCE/DEVICE Description: STG Adhere to Safety Precautions With cues Assistance/Device. Outcome: Progressing   Problem: RH KNOWLEDGE DEFICIT GENERAL Goal: RH STG INCREASE KNOWLEDGE OF SELF CARE AFTER HOSPITALIZATION Description: Patient and family will be able to manage care at discharge using educational resources for medication and dietary modification independently Outcome: Progressing

## 2024-01-25 NOTE — Progress Notes (Signed)
 Occupational Therapy Session Note  Patient Details  Name: Vanessa [name] MRN: [id number] Date of Birth: 08-27-1951  Today's Date: 01/25/2024 OT Individual Time: 4098-1191 & 1335-1415 OT Individual Time Calculation (min): 55 min & 40 min   Short Term Goals: Week 2:  OT Short Term Goal 1 (Week 2): STG = LTG due to ELOS  Skilled Therapeutic Interventions/Progress Updates:  Session 1 Skilled OT intervention completed with focus on ADL retraining, functional endurance and transfers. Pt received upright in bed, upset about IV placement for fluids. MD present for rounds and discussed with pt the purpose of fluids and it's inability to prevent her from DC. Pt agreeable to session. No pain reported, however nurse present at end of session.  Encouraged pt to trial supine > sit transition from flat bed as she has standard bed at home. Pt required mod cueing to and + time for technique, however once educated on sidelying position and using elbow to prop up, pt required initial min A fading to CGA without use of bed rail. Pt feels her sister can assist at that level.  Light min A sit > stand with cues for 1 hand on RW and other on thigh, then ambulated to sink with CGA using RW. Able to doff all clothing with CGA for balance. Seated at sink, pt completed all bathing and dressing with overall min A for the sit > stand with RW and CGA for balance while in stance. Rest breaks needed for fatigue.   Pt remained seated in w/c, with chair alarm on/activated, and with all needs in reach at end of session.  Session 2 Skilled OT intervention completed with focus on ADL retraining and functional ambulatory endurance. Pt received seated in recliner, agreeable to session. No pain reported.  Pt voiced need for BM. Completed Min A sit > stand with RW with cues for nose over toes, then ambulated with CGA using RW > elevated BSC over toilet. Continent of BM; charted. Seated pericare with supervision using only 1 hand  for sternal precaution adherence. Min A sit > stand with RW, then CGA for donning pants over hips and ambulating to sink with RW. Set up A for hand hygiene seated.   In hallway, pt required min A for each sit > stand (2 total), with RW then ambulated 88 ft x2 with CGA using RW with w/c brought behind for fatigue. Seated rest break for fatigue. Pt remained seated in w/c, with bed alarm on/activated, and with all needs in reach at end of session.   Therapy Documentation Precautions:  Precautions Precautions: Fall, Sternal Restrictions Weight Bearing Restrictions Per Provider Order: No Other Position/Activity Restrictions: sternal precautions   Therapy/Group: Individual Therapy  [name], MS, OTR/L  01/25/2024, 2:19 PM

## 2024-01-25 NOTE — Progress Notes (Signed)
 Patient ID: Vanessa [name], female   DOB: 03/16/1951, 73 y.o.   MRN: [id number]  Met with pt to discuss transport to Op-[facility] have contacted [facility] to make sure she is certified and can use them. She is and with pt called [facility] on speaker to schedule her appointment on Monday. Pick up with be 10:29-10:50 for her appointment at 11;15 and pick up is set for 3:30. She is aware she will need to call for wed and Friday and then can make a standing appointment once know how long her OP-HD takes. She is aware her equipment has been ordered and there is a co-pay and [employer] will be reaching out to her. Continue to work on discharge needs.

## 2024-01-25 NOTE — Progress Notes (Signed)
 PHARMACY - ANTICOAGULATION CONSULT NOTE  Pharmacy Consult for Warfarin Indication: atrial fibrillation  Allergies  Allergen Reactions   Spironolactone     hyperkalemia    Patient Measurements: Height: 5\' 3"  (160 cm) Weight: 68.2 kg (150 lb 5.7 oz) IBW/kg (Calculated) : 52.4  Vital Signs: Temp: 98.3 F (36.8 C) (02/25 0438) BP: 99/70 (02/25 1000) Pulse Rate: 79 (02/25 1000)  Labs: Recent Labs    01/23/24 0915 01/24/24 0628 01/24/24 1258 01/25/24 0516  HGB  --   --   --  11.9*  HCT  --   --   --  37.1  PLT  --   --   --  93*  LABPROT 22.6* 20.3*  --  22.2*  INR 2.0* 1.7*  --  1.9*  CREATININE  --   --  7.86*  --     Estimated Creatinine Clearance: 6 mL/min (A) (by C-G formula based on SCr of 7.86 mg/dL (H)).  Assessment: 73 yo female s/p scheduled bioprosthetic mitral valve replacement with tissue valve 12/30 now with afib, pharmacy asked to dose anticoagulation with IV heparin while inpatient. Spontaneously converted 1/7; flipped back to AF overnight 1/9 0300. Baseline INR 1.1, slow uptrend to 1.3 s/p 6 days warfarin therapy, total dose 20 mg, then held 1/21-24 for tunneled access plans. Patient now s/p [facility] and tunneled central line placement 1/24 AM on CRRT, plan HD MWF. Off norepinephrine since 1/26, remains on midodrine. Resumed anticoagulation per VIR after tunneled cath placement. Heparin stopped 1/31 with INR >2.   INR subtherapeutic 1.9, but trended up from 1.7 yesterday after 4 mg dose given. INR had been therapeutic on 3 mg daily, then INR trended up to 3.0-3.1 on 2/14 thru 2/16  and warfarin doses were reduced. INR then subtherapeutic 1.7 on 2/20 and doses increased to 3-4 mg. Last CBC 2/18 > rechecked today. Hgb stable 11.9, but platelet count has decreased from 150 to 93.  No bleeding reported.  Continues on Amiodarone, which can increase sensitivity to warfarin, but on amiodarone prior to warfarin initiation.   Goal of Therapy:  INR 2-3 (bMVR) Monitor  platelets by anticoagulation protocol: Yes   Plan:  Warfarin 4 mg x 1 again today. Daily PT/INR; decrease frequency when stable. Recheck CBC on 2/27.  Dennie Fetters, RPh 01/25/2024,12:35 PM

## 2024-01-25 NOTE — Progress Notes (Signed)
 [facility] NEPHROLOGY PROGRESS NOTE  Assessment/ Plan: Pt is a 73 y.o. yo female     1.  AKI on CKD 3 yea: ATN from shock.  Maintain dialysis on MWF schedule.  Accepted to [facility] MWF 12:10 pm chair time. Pt will need to arrive at 11:15 am for paperwork on first day.   2.  Severe mitral regurgitation/tricuspid regurgitation: Status post repair with postoperative cardiac tamponade.  Additional management per cardiothoracic surgery. Stable at this time  3.  Acute exacerbation of congestive heart failure with reduced ejection fraction: Volume status significantly improved with ongoing hemodialysis/ultrafiltration.  4.  Shock: From a combination of cardiogenic and vasoplegic etiology-currently on midodrine after previously requiring pressor/inotropic support. Now on high dose midodrine.  Would give before therapy  5.  Hyperphosphatemia: Last phosphorus at goal. Cont binder  6. Anemia of CKD; last Hb at goal.   7. Hyponatremia: attempting to manage with HD and UF  Subjective: Patient seen and examined bedside.  She reports that she didn't feel well on dialysis yesterday, had issues with BP. Net uf 0.5L. She is still hopeful for renal recovery but is not making any urine really at this time.  Objective Vital signs in last 24 hours: Vitals:   01/25/24 0451 01/25/24 0632 01/25/24 1000 01/25/24 1457  BP: (!) 88/60 99/63 99/70  114/72  Pulse:  79 79 95  Resp:    19  Temp:    97.7 F (36.5 C)  TempSrc:      SpO2:    99%  Weight:      Height:       Weight change: 3.3 kg  Intake/Output Summary (Last 24 hours) at 01/25/2024 1620 Last data filed at 01/25/2024 1000 Gross per 24 hour  Intake 360 ml  Output 500 ml  Net -140 ml       Labs: RENAL PANEL Recent Labs  Lab 01/24/24 1258  NA 127*  K 4.2  CL 86*  CO2 21*  GLUCOSE 114*  BUN 50*  CREATININE 7.86*  CALCIUM 9.7    Liver Function Tests: No results for input(s): "AST", "ALT", "ALKPHOS", "BILITOT", "PROT",  "ALBUMIN" in the last 168 hours.  No results for input(s): "LIPASE", "AMYLASE" in the last 168 hours. No results for input(s): "AMMONIA" in the last 168 hours. CBC: Recent Labs    01/10/24 0500 01/12/24 0605 01/15/24 1304 01/18/24 1333 01/25/24 0516  HGB 11.1* 11.8* 12.4 11.5* 11.9*  MCV 98.0 97.9 96.1 95.2 93.7    Cardiac Enzymes: No results for input(s): "CKTOTAL", "CKMB", "CKMBINDEX", "TROPONINI" in the last 168 hours. CBG: Recent Labs  Lab 01/22/24 1651 01/22/24 2100 01/23/24 0604 01/24/24 0602 01/25/24 0559  GLUCAP 123* 154* 120* 102* 103*    Iron Studies: No results for input(s): "IRON", "TIBC", "TRANSFERRIN", "FERRITIN" in the last 72 hours. Studies/Results: No results found.    Medications: Infusions:  anticoagulant sodium citrate      Scheduled Medications:  amiodarone  200 mg Oral Daily   ascorbic acid  250 mg Oral BID   Chlorhexidine Gluconate Cloth  6 each Topical Q12H   cholecalciferol  1,000 Units Oral Daily   docusate sodium  100 mg Oral BID   fludrocortisone  0.05 mg Oral Daily   Gerhardt's butt cream   Topical BID   lanthanum  500 mg Oral TID WC   midodrine  20 mg Oral TID WC   mirtazapine  7.5 mg Oral QHS   multivitamin  1 tablet Oral BID   polyethylene  glycol  17 g Oral Daily   senna-docusate  1 tablet Oral QHS   sertraline  25 mg Oral Daily   sodium chloride flush  10-40 mL Intracatheter Q12H   warfarin  4 mg Oral ONCE-1600   Warfarin - Pharmacist Dosing Inpatient   Does not apply q1600    have reviewed scheduled and prn medications.  Physical Exam: General:NAD, comfortable, in chair Heart:normal rate, no rub Lungs: Bilateral chest rise with no increased work of breathing Abdomen:soft, Non-tender, non-distended Extremities: trace ankle edema b/l, warm and well-perfused Dialysis Access: Right IJ TDC in place  Vanessa [name] 01/25/2024,4:20 PM  LOS: 13 days

## 2024-01-25 NOTE — Progress Notes (Signed)
 Physical Therapy Session Note  Patient Details  Name: Vanessa [name] MRN: [id number] Date of Birth: 1951-04-29  Today's Date: 01/25/2024 PT Individual Time: 2130-8657 PT Individual Time Calculation (min): 54 min   Short Term Goals: Week 2:  PT Short Term Goal 1 (Week 2): STG=LTG due to LOS  Skilled Therapeutic Interventions/Progress Updates: Patient sitting in WC on entrance to room. Patient alert and agreeable to PT session.   Patient reported no pain, only fatigue this morning due to dialysis and performing stairs with PT yesterday. Pt with IV on L UE due to low BP the previous night. Attending nsg stated order has been placed for IV pole, and to monitor pt's reports of symptoms/BP if necessary. Pt transported from room<>day room/main gym dependently in [facility] for energy conservation and decreased ambulatory tolerance. Pt reported fatigue and required personal care at end off session with pt transported back to room following therex.   Therapeutic Activity: Transfers: Pt performed sit<>stand transfers throughout session with modA to stand from [facility], and supervision to pivot with RW. Provided VC for increasing anterior weight shift. Pt required increased time/effort to scoot to edge and to acquire the needed power up/weight shift to stand after multiple attempts.   Therapeutic Exercise: Pt performed the following exercises with therapist providing the described cuing and facilitation for improvement. - Kinetron 2 x 3 min 30 sec with rest breaks required on 15 CM/SEC to improve blood flow to B LE's prior to sit to standing tolerance in // bars - Pt performed x 4 sit to stand from elevated hi/low mat with modA that progressed to minA on final stand with tactile/VC to count to 3 with anterior/posterior lean to use momentum to assist with standing power.   Patient sitting in WC at end of session with brakes locked, waiting on NT/nsg to arrive to assist to bathroom, and all needs within reach.       Therapy Documentation Precautions:  Precautions Precautions: Fall, Sternal Restrictions Weight Bearing Restrictions Per Provider Order: No LUE Weight Bearing Per Provider Order: Non weight bearing Other Position/Activity Restrictions: sternal precautions  VITALS: BP 94/71 (79)  HR 79   Therapy/Group: Individual Therapy  Raunel Dimartino PTA 01/25/2024, 1:22 PM

## 2024-01-25 NOTE — Progress Notes (Signed)
 PROGRESS NOTE   Subjective/Complaints: Upset this morning because she needs IV fluids, but she is willing to do this for one day, discussed trying to stay well hydrated with oral water  ROS:   ROS: Denies fevers, chills,   abdominal pain, SOB, chest pain, new weakness or paraesthesias.   + Nausea/vomiting-intermittent-none voiced +temporary blurred vision today-resolved  Objective:   No results found.   Recent Labs    01/25/24 0516  WBC 9.1  HGB 11.9*  HCT 37.1  PLT 93*     Recent Labs    01/24/24 1258  NA 127*  K 4.2  CL 86*  CO2 21*  GLUCOSE 114*  BUN 50*  CREATININE 7.86*  CALCIUM 9.7      Intake/Output Summary (Last 24 hours) at 01/25/2024 1002 Last data filed at 01/24/2024 1847 Gross per 24 hour  Intake 120 ml  Output 500 ml  Net -380 ml     Pressure Injury 01/12/24 Buttocks Left Deep Tissue Pressure Injury - Purple or maroon localized area of discolored intact skin or blood-filled blister due to damage of underlying soft tissue from pressure and/or shear. Purple DTI 2cm round (Active)  01/12/24 1830  Location: Buttocks  Location Orientation: Left  Staging: Deep Tissue Pressure Injury - Purple or maroon localized area of discolored intact skin or blood-filled blister due to damage of underlying soft tissue from pressure and/or shear.  Wound Description (Comments): Purple DTI 2cm round  Present on Admission: Yes    Physical Exam: Vital Signs Blood pressure 99/63, pulse 79, temperature 98.3 F (36.8 C), resp. rate 18, height 5\' 3"  (1.6 m), weight 68.2 kg, SpO2 94%. Gen: no distress, normal appearing.  Sitting up in w/c with OT.   HEENT: oral mucosa pink and moist, NCAT Cardio: Reg rate and rhythm, no m/r/g appreciated Chest: normal effort, normal rate of breathing, CTAB Abd: soft, normal bowel sounds, non-distended, nonTTP Ext: no edema, +asterixis Psych: pleasant, flat affect Skin: intact;  right chest dialysis port C/D/I Neuro: No focal neurologic deficits.  Awake, alert and oriented x 4.  Moving all 4 extremities antigravity and against resistance.  Touch intact.  Reflexes normal.  No tone, stable 2/25   Assessment/Plan: 1. Functional deficits which require 3+ hours per day of interdisciplinary therapy in a comprehensive inpatient rehab setting. Physiatrist is providing close team supervision and 24 hour management of active medical problems listed below. Physiatrist and rehab team continue to assess barriers to discharge/monitor patient progress toward functional and medical goals  Care Tool:  Bathing    Body parts bathed by patient: Right arm, Left arm, Chest, Abdomen, Front perineal area, Right upper leg, Left upper leg, Face, Right lower leg, Left lower leg, Buttocks   Body parts bathed by helper: Left lower leg, Right lower leg, Buttocks     Bathing assist Assist Level: Contact Guard/Touching assist     Upper Body Dressing/Undressing Upper body dressing   What is the patient wearing?: Pull over shirt    Upper body assist Assist Level: Set up assist    Lower Body Dressing/Undressing Lower body dressing      What is the patient wearing?: Pants  Lower body assist Assist for lower body dressing: Contact Guard/Touching assist     Toileting Toileting    Toileting assist Assist for toileting: Contact Guard/Touching assist     Transfers Chair/bed transfer  Transfers assist     Chair/bed transfer assist level: Contact Guard/Touching assist     Locomotion Ambulation   Ambulation assist      Assist level: Supervision/Verbal cueing Assistive device: Walker-rolling Max distance: 24ft   Walk 10 feet activity   Assist     Assist level: Supervision/Verbal cueing Assistive device: Walker-rolling   Walk 50 feet activity   Assist Walk 50 feet with 2 turns activity did not occur: Safety/medical concerns (fatigue)  Assist level:  Supervision/Verbal cueing Assistive device: Walker-rolling    Walk 150 feet activity   Assist Walk 150 feet activity did not occur: Safety/medical concerns (fatigue)         Walk 10 feet on uneven surface  activity   Assist Walk 10 feet on uneven surfaces activity did not occur: Safety/medical concerns (fatigue)         Wheelchair     Assist Is the patient using a wheelchair?: Yes Type of Wheelchair: Manual    Wheelchair assist level: Dependent - Patient 0% Max wheelchair distance: 74ft    Wheelchair 50 feet with 2 turns activity    Assist        Assist Level: Dependent - Patient 0%   Wheelchair 150 feet activity     Assist      Assist Level: Dependent - Patient 0%   Blood pressure 99/63, pulse 79, temperature 98.3 F (36.8 C), resp. rate 18, height 5\' 3"  (1.6 m), weight 68.2 kg, SpO2 94%.  Medical Problem List and Plan: 1. Functional deficits secondary cardiab debility complicated by AKI             -patient may shower             -ELOS/Goals: 8-11 days S             continue CIR  Messaged team to set d/c date-- 01/29/24  Placed order for central line removal  2.  Antithrombotics: -DVT/anticoagulation:  Pharmaceutical: continue Coumadin, INR reviewed and stable             -antiplatelet therapy:   3. Pain Management: denies pain. Tylenol prn mild pain, d/c oxycodone  4. Mood/Behavior/Sleep: Team to provide ego support. LCSW to follow for evaluation and support.              -d/c seroquel, continue low dose Remeron 7.5mg  at nights. Melatonin prn.              -antipsychotic agents: Seroquel daily at bedtime--d/c'd.  5. Neuropsych/cognition: This patient is capable of making decisions on her own behalf.  6. Skin/Wound Care: routine pressure relief measures. Monitor wound for healing  7. Overweight: provide dietary educaion  8. Afib/A flutter: Monitor HR TID and for symptoms with activity.  -continue Amiodarone 200 mg daily with  coumadin. -Heart rate well-controlled  9. Hypotension: TEDs. Midodrine 20 mg TID. KHT for support. Add fludricortisone 0.05mg  daily, continue, IVF ordered  Vitals:   01/24/24 1610 01/24/24 1635 01/24/24 1705 01/24/24 1732  BP: 94/65 (!) 109/59 (!) 92/59 93/66   01/24/24 1805 01/24/24 1823 01/24/24 1847 01/24/24 1954  BP: 99/66 109/68 (!) 98/58 (!) 87/62   01/24/24 2131 01/25/24 0438 01/25/24 0451 01/25/24 0632  BP: 90/60 (!) 85/55 (!) 88/60 99/63     10. Acute on  CKD: Now on HD MWF due to ischemic ATN/shock. Schedule HD at the end of the day to help with tolerance of therapy             --continue Phoslo for now--should improve with renal restrictions.   Continue fluid restriction    11. Acute on chronic anemia: continue Aranesp weekly    12. HFrEF: Strict I/O. Daily weights. Fluid status being managed with HD.   Reviewed and weights decreased Filed Weights   01/24/24 0505 01/24/24 1823 01/25/24 0440  Weight: 65.7 kg (S) 69 kg 68.2 kg    22. Depression: continue Zoloft 25mg  daily, Remeron 7.5mg  daily; seroquel d/c'd. Provide ego support.    23. Constipation: Increase colace to BID, miralax daily, decrease senna-docusate to 1 tab HS  24. Fatigue: improved, fludricortisone restarted due to symptoms concerning for orthostasis, continue  25. Type 2 diabetes: d/c feeding supplement  Improved, d/c CBGs  CBG (last 3)  Recent Labs    01/23/24 0604 01/24/24 0602 01/25/24 0559  GLUCAP 120* 102* 103*     26.  Nausea/vomiting.  Resolved  27. Blurred vision: discussed could be 2/2 hypotension, resolved, fludricortisone restarted  LOS: 13 days A FACE TO FACE EVALUATION WAS PERFORMED  Ilham Roughton P Geoffrey Hynes 01/25/2024, 10:02 AM

## 2024-01-25 NOTE — Plan of Care (Signed)
  Problem: Sit to Stand Goal: LTG:  Patient will perform sit to stand in prep for activites of daily living with assistance level (OT) Description: LTG:  Patient will perform sit to stand in prep for activites of daily living with assistance level (OT) Flowsheets (Taken 01/25/2024 0744) LTG: PT will perform sit to stand in prep for activites of daily living with assistance level: (downgraded 2/2 global deconditioning, dynamic standing balance and endurance deficits) Contact Guard/Touching assist   Problem: RH Bathing Goal: LTG Patient will bathe all body parts with assist levels (OT) Description: LTG: Patient will bathe all body parts with assist levels (OT) Flowsheets (Taken 01/25/2024 0744) LTG: Pt will perform bathing with assistance level/cueing: (downgraded 2/2 global deconditioning, dynamic standing balance and endurance deficits) Minimal Assistance - Patient > 75%   Problem: RH Dressing Goal: LTG Patient will perform lower body dressing w/assist (OT) Description: LTG: Patient will perform lower body dressing with assist, with/without cues in positioning using equipment (OT) Flowsheets (Taken 01/25/2024 0744) LTG: Pt will perform lower body dressing with assistance level of: (downgraded 2/2 global deconditioning, dynamic standing balance and endurance deficits) Contact Guard/Touching assist   Problem: RH Toileting Goal: LTG Patient will perform toileting task (3/3 steps) with assistance level (OT) Description: LTG: Patient will perform toileting task (3/3 steps) with assistance level (OT)  Flowsheets (Taken 01/25/2024 0744) LTG: Pt will perform toileting task (3/3 steps) with assistance level: (downgraded 2/2 global deconditioning, dynamic standing balance and endurance deficits) Contact Guard/Touching assist

## 2024-01-25 NOTE — Progress Notes (Signed)
 Physical Therapy Session Note  Patient Details  Name: Vanessa [name] MRN: [id number] Date of Birth: January 17, 1951  Today's Date: 01/25/2024 PT Individual Time: 1415-1456 PT Individual Time Calculation (min): 41 min   Short Term Goals: Week 1:  PT Short Term Goal 1 (Week 1): pt will transfer supine<>sitting EOB with CGA PT Short Term Goal 1 - Progress (Week 1): Met PT Short Term Goal 2 (Week 1): pt will transfer sit<>stand with LRAD and min A consistantly PT Short Term Goal 2 - Progress (Week 1): Progressing toward goal PT Short Term Goal 3 (Week 1): pt will ambulate 83ft with LRAD and CGA PT Short Term Goal 3 - Progress (Week 1): Met Week 2:  PT Short Term Goal 1 (Week 2): STG=LTG due to LOS  Skilled Therapeutic Interventions/Progress Updates:   Received pt sitting in WC, pt agreeable to PT treatment, and denied any pain during session but requested to focus on activities other than walking due to fatigue. Session with emphasis on functional mobility/transfers, generalized strengthening and endurance, simulated car transfers, and ambulation.   Pt transported to/from room in [facility] dependently for time management purposes. Pt performed simulated car transfer with RW and mod A overall. Pt required heavy mod A to stand from low sitting WC due to fatigue, CGA for stand<>pivot transfer to/from car, mod A to get BLEs into car, but able to get legs out of car and stand from car seat without assist! Pt then ambulated ~4ft with RW and supervision to fatigue. Pt performed the following seated exercises with emphasis on LE strength/ROM: -hip flexion with 1.5lb ankle weight 2x15 bilaterally -knee extension with 1.5lb ankle weight 2x15 bilaterally  -lateral trunk rotations with 2.2lb medicine ball 2x10 bilaterally  Returned to room and pt requested to sit in recliner. Stood from [facility] with RW and heavy mod A and transferred into recliner via stand<>pivot with RW and CGA. Pt required assist to scoot hips back in  recliner due to height. Concluded session with pt sitting in recliner, needs within reach, and chair pad alarm on. NT at bedside checking vitals.   Therapy Documentation Precautions:  Precautions Precautions: Fall, Sternal Restrictions Weight Bearing Restrictions Per Provider Order: No LUE Weight Bearing Per Provider Order: Non weight bearing Other Position/Activity Restrictions: sternal precautions  Therapy/Group: Individual Therapy Marlana Salvage Zaunegger Blima Rich PT, DPT 01/25/2024, 6:49 AM

## 2024-01-26 LAB — RENAL FUNCTION PANEL
Albumin: 2.9 g/dL — ABNORMAL LOW (ref 3.5–5.0)
Anion gap: 15 (ref 5–15)
BUN: 39 mg/dL — ABNORMAL HIGH (ref 8–23)
CO2: 21 mmol/L — ABNORMAL LOW (ref 22–32)
Calcium: 8.8 mg/dL — ABNORMAL LOW (ref 8.9–10.3)
Chloride: 88 mmol/L — ABNORMAL LOW (ref 98–111)
Creatinine, Ser: 7.05 mg/dL — ABNORMAL HIGH (ref 0.44–1.00)
GFR, Estimated: 6 mL/min — ABNORMAL LOW (ref >60)
Glucose, Bld: 131 mg/dL — ABNORMAL HIGH (ref 70–99)
Phosphorus: 2.7 mg/dL (ref 2.5–4.6)
Potassium: 3.8 mmol/L (ref 3.5–5.1)
Sodium: 124 mmol/L — ABNORMAL LOW (ref 135–145)

## 2024-01-26 LAB — CBC
HCT: 33.7 % — ABNORMAL LOW (ref 36.0–46.0)
Hemoglobin: 10.8 g/dL — ABNORMAL LOW (ref 12.0–15.0)
MCH: 30 pg (ref 26.0–34.0)
MCHC: 32 g/dL (ref 30.0–36.0)
MCV: 93.6 fL (ref 80.0–100.0)
Platelets: 102 10*3/uL — ABNORMAL LOW (ref 150–400)
RBC: 3.6 MIL/uL — ABNORMAL LOW (ref 3.87–5.11)
RDW: 15.9 % — ABNORMAL HIGH (ref 11.5–15.5)
WBC: 9.4 10*3/uL (ref 4.0–10.5)
nRBC: 0 % (ref 0.0–0.2)

## 2024-01-26 LAB — PROTIME-INR
INR: 1.9 — ABNORMAL HIGH (ref 0.8–1.2)
Prothrombin Time: 21.6 s — ABNORMAL HIGH (ref 11.4–15.2)

## 2024-01-26 LAB — GLUCOSE, CAPILLARY
Glucose-Capillary: 106 mg/dL — ABNORMAL HIGH (ref 70–99)
Glucose-Capillary: 76 mg/dL (ref 70–99)
Glucose-Capillary: 168 mg/dL — ABNORMAL HIGH (ref 70–99)

## 2024-01-26 MED ORDER — HEPARIN SODIUM (PORCINE) 1000 UNIT/ML IJ SOLN
3200.0000 [IU] | Freq: Once | INTRAMUSCULAR | Status: AC
  Administered 2024-01-26: 3200 [IU]
  Filled 2024-01-26: qty 4

## 2024-01-26 MED ORDER — WARFARIN SODIUM 4 MG PO TABS
4.0000 mg | ORAL_TABLET | Freq: Once | ORAL | Status: AC
  Administered 2024-01-26: 4 mg via ORAL
  Filled 2024-01-26: qty 1

## 2024-01-26 MED ORDER — ALBUMIN HUMAN 25 % IV SOLN
25.0000 g | Freq: Once | INTRAVENOUS | Status: AC
  Administered 2024-01-26: 25 g via INTRAVENOUS
  Filled 2024-01-26: qty 100

## 2024-01-26 MED ORDER — FLUDROCORTISONE ACETATE 0.1 MG PO TABS
0.1000 mg | ORAL_TABLET | Freq: Every day | ORAL | Status: DC
  Administered 2024-01-27 – 2024-01-29 (×3): 0.1 mg via ORAL
  Filled 2024-01-26 (×3): qty 1

## 2024-01-26 NOTE — Progress Notes (Signed)
 Physical Therapy Session Note  Patient Details  Name: Vanessa [name] MRN: [id number] Date of Birth: 15-Apr-1951  Today's Date: 01/26/2024 PT Individual Time: 0915-1025 PT Individual Time Calculation (min): 70 min   Short Term Goals: Week 1:  PT Short Term Goal 1 (Week 1): pt will transfer supine<>sitting EOB with CGA PT Short Term Goal 1 - Progress (Week 1): Met PT Short Term Goal 2 (Week 1): pt will transfer sit<>stand with LRAD and min A consistantly PT Short Term Goal 2 - Progress (Week 1): Progressing toward goal PT Short Term Goal 3 (Week 1): pt will ambulate 20ft with LRAD and CGA PT Short Term Goal 3 - Progress (Week 1): Met Week 2:  PT Short Term Goal 1 (Week 2): STG=LTG due to LOS  Skilled Therapeutic Interventions/Progress Updates:   Received pt sitting in WC, pt agreeable to PT treatment, and denied any pain during session; just fatigue. Session with emphasis on functional mobility/transfers, generalized strengthening and endurance, dynamic standing balance/coordination, and gait training. Pt transported to/from room in [facility] dependently for time management purposes. Pt required multiple extended rest breaks throughout session due to fatigue. Pt required multiple attempts and ultimately mod A to stand from [facility] and ambulated 167ft with RW and close supervision. MD arrived for morning rounds, then pt performed seated BLE strengthening on Nustep at workload 1 for 8 minutes for a total of 378 steps with emphasis on cardiovascular endurance - required 2 rest breaks. Stood from [employer] with RW and min A and ambulated additional 20ft with RW and supervision to mat, then stood from EOM with RW and min A and able to pick up object from floor using reacher and supervision - discussed ordering reacher for home use.   Provided pt with walker bag and stood x 2 additional reps from EOM with RW and heavy min A and performed alternating standing marches 2x10 bilaterally and hip abduction 1x10 bilaterally  to fatigue with emphasis on LE strength. Transferred in [facility] via stand<>pivot with RW and supervision and returned to room. Pt reporting 9/10 fatigue and BP at end of session 102/67. Concluded session with pt sitting in WC, needs within reach, and chair pad alarm on.   Therapy Documentation Precautions:  Precautions Precautions: Fall, Sternal Restrictions Weight Bearing Restrictions Per Provider Order: No LUE Weight Bearing Per Provider Order: Non weight bearing Other Position/Activity Restrictions: sternal precautions  Therapy/Group: Individual Therapy Marlana Salvage Zaunegger Blima Rich PT, DPT 01/26/2024, 6:49 AM

## 2024-01-26 NOTE — Progress Notes (Signed)
 PHARMACY - ANTICOAGULATION CONSULT NOTE  Pharmacy Consult for Warfarin Indication: atrial fibrillation  Allergies  Allergen Reactions   Spironolactone     hyperkalemia    Patient Measurements: Height: 5\' 3"  (160 cm) Weight: 65.2 kg (143 lb 11.8 oz) IBW/kg (Calculated) : 52.4  Vital Signs: Temp: 97.9 F (36.6 C) (02/26 0425) BP: 102/65 (02/26 0425) Pulse Rate: 81 (02/26 0425)  Labs: Recent Labs    01/24/24 0628 01/24/24 1258 01/25/24 0516 01/26/24 0528  HGB  --   --  11.9*  --   HCT  --   --  37.1  --   PLT  --   --  93*  --   LABPROT 20.3*  --  22.2* 21.6*  INR 1.7*  --  1.9* 1.9*  CREATININE  --  7.86*  --   --     Estimated Creatinine Clearance: 5.9 mL/min (A) (by C-G formula based on SCr of 7.86 mg/dL (H)).  Assessment: 73 yo female s/p scheduled bioprosthetic mitral valve replacement with tissue valve 12/30 now with afib, pharmacy asked to dose anticoagulation with IV heparin while inpatient. Spontaneously converted 1/7; flipped back to AF overnight 1/9 0300. Baseline INR 1.1, slow uptrend to 1.3 s/p 6 days warfarin therapy, total dose 20 mg, then held 1/21-24 for tunneled access plans. Patient now s/p [facility] and tunneled central line placement 1/24 AM on CRRT, plan HD MWF. Off norepinephrine since 1/26, remains on midodrine. Resumed anticoagulation per VIR after tunneled cath placement. Heparin stopped 1/31 with INR >2.   INR subtherapeutic 1.9 x 2 days, but trended up from 1.7 yesterday after 4 mg dose given 2/24 and 2/25 INR had been therapeutic on 3 mg daily, then INR trended up to 3.0-3.1 on 2/14 thru 2/16  and warfarin doses were reduced. INR then subtherapeutic 1.7 on 2/20 and doses increased to 3-4 mg. Last CBC 2/18 > rechecked 2/25. Hgb stable 11.9, but platelet count has decreased from 150 to 93.  No bleeding reported.  Continues on Amiodarone, which can increase sensitivity to warfarin, but on amiodarone prior to warfarin initiation.   Goal of Therapy:  INR  2-3 (bMVR) Monitor platelets by anticoagulation protocol: Yes   Plan:  Warfarin 4 mg x 1 again today. Daily PT/INR; decrease frequency when stable. Recheck CBC on 2/27.  Dennie Fetters, RPh 01/26/2024,1:29 PM

## 2024-01-26 NOTE — Progress Notes (Signed)
 Received patient in bed to unit.  Alert and oriented.  Informed consent signed and in chart.   TX duration:3 hours and 15 minutes  Patient tolerated well.  Transported back to the room  Alert, without acute distress.  Hand-off given to patient's nurse.   Access used: Right internal jugular HD Cath Access issues: BFR down to 300 due to low Venous pressures  Total UF removed: 3L Medication(s) given: midodrine   01/26/24 1750  Vitals  Temp 97.6 F (36.4 C)  Temp Source Oral  BP (!) 92/56  Pulse Rate 76  ECG Heart Rate 77  Resp 19  Oxygen Therapy  SpO2 96 %  O2 Device Room Air  During Treatment Monitoring  Duration of HD Treatment -hour(s) 3.25 hour(s)  HD Safety Checks Performed Yes  Intra-Hemodialysis Comments Tx completed  Dialysis Fluid Bolus Normal Saline  Bolus Amount (mL) 300 mL  Post Treatment  Dialyzer Clearance Clear  Liters Processed 57  Fluid Removed (mL) 1000 mL  Tolerated HD Treatment Yes  Hemodialysis Catheter Right Internal jugular Double lumen Permanent (Tunneled)  Placement Date/Time: 12/24/23 0921   Serial / Lot #: [id number]  Expiration Date: 02/28/28  Time Out: Correct patient;Correct site;Correct procedure  Maximum sterile barrier precautions: Hand hygiene;Cap;Mask;Sterile gown;Sterile gloves;Large sterile s...  Site Condition No complications  Blue Lumen Status Flushed;Dead end cap in place;Heparin locked  Red Lumen Status Flushed;Heparin locked;Dead end cap in place  Purple Lumen Status N/A  Catheter fill solution Heparin 1000 units/ml  Catheter fill volume (Arterial) 1.6 cc  Catheter fill volume (Venous) 1.6  Dressing Type Transparent  Dressing Status Clean, Dry, Intact;Antimicrobial disc/dressing in place  Interventions Other (Comment) (deaccessed)  Drainage Description None  Dressing Change Due 01/31/24  Post treatment catheter status Capped and Clamped     Stacie Glaze LPN [facility]

## 2024-01-26 NOTE — Progress Notes (Signed)
 Patient ID: Vanessa [name], female   DOB: January 26, 1951, 73 y.o.   MRN: [id number]  Met with pt to discuss team conference update with progression toward her goals.She did well today but is tired from walking 186 ft with PT. Her sister is coming to assist with the transition and therapies to video pt and give hands outs since will not be here for education prior to discharge home. Work on DME to make sure has prior to discharge.

## 2024-01-26 NOTE — Progress Notes (Signed)
 [facility] NEPHROLOGY PROGRESS NOTE  Assessment/ Plan: Pt is a 73 y.o. yo female     1.  AKI on CKD 3 yea: ATN from shock.  Maintain dialysis on MWF schedule.  Accepted to [facility] MWF 12:10 pm chair time. Pt will need to arrive at 11:15 am for paperwork on first day.   2.  Severe mitral regurgitation/tricuspid regurgitation: Status post repair with postoperative cardiac tamponade.  Additional management per cardiothoracic surgery. Stable at this time  3.  Acute exacerbation of congestive heart failure with reduced ejection fraction: Volume status significantly improved with ongoing hemodialysis/ultrafiltration.  4.  Shock: From a combination of cardiogenic and vasoplegic etiology-currently on midodrine after previously requiring pressor/inotropic support. Now on high dose midodrine.  Would give before therapy+HD  5.  Hyperphosphatemia: Last phosphorus at goal. Cont binder  6. Anemia of CKD; last Hb at goal.   7. Hyponatremia: attempting to manage with HD and UF  Subjective: Patient seen and examined bedside. Feels tired after therapy, denies any CP/SOB. Going to HD now with transport  Objective Vital signs in last 24 hours: Vitals:   01/25/24 1936 01/25/24 2113 01/26/24 0425 01/26/24 0600  BP: (!) 85/57 90/60 102/65   Pulse: 87 97 81   Resp:   16   Temp: 98.1 F (36.7 C)  97.9 F (36.6 C)   TempSrc:      SpO2: 95%  96%   Weight:    65.2 kg  Height:       Weight change: -3.8 kg  Intake/Output Summary (Last 24 hours) at 01/26/2024 1342 Last data filed at 01/26/2024 0744 Gross per 24 hour  Intake 160 ml  Output --  Net 160 ml       Labs: RENAL PANEL Recent Labs  Lab 01/24/24 1258  NA 127*  K 4.2  CL 86*  CO2 21*  GLUCOSE 114*  BUN 50*  CREATININE 7.86*  CALCIUM 9.7    Liver Function Tests: No results for input(s): "AST", "ALT", "ALKPHOS", "BILITOT", "PROT", "ALBUMIN" in the last 168 hours.  No results for input(s): "LIPASE", "AMYLASE" in the  last 168 hours. No results for input(s): "AMMONIA" in the last 168 hours. CBC: Recent Labs    01/10/24 0500 01/12/24 0605 01/15/24 1304 01/18/24 1333 01/25/24 0516  HGB 11.1* 11.8* 12.4 11.5* 11.9*  MCV 98.0 97.9 96.1 95.2 93.7    Cardiac Enzymes: No results for input(s): "CKTOTAL", "CKMB", "CKMBINDEX", "TROPONINI" in the last 168 hours. CBG: Recent Labs  Lab 01/24/24 0602 01/25/24 0559 01/25/24 2042 01/26/24 0614 01/26/24 1223  GLUCAP 102* 103* 148* 106* 76    Iron Studies: No results for input(s): "IRON", "TIBC", "TRANSFERRIN", "FERRITIN" in the last 72 hours. Studies/Results: No results found.    Medications: Infusions:  anticoagulant sodium citrate      Scheduled Medications:  amiodarone  200 mg Oral Daily   ascorbic acid  250 mg Oral BID   Chlorhexidine Gluconate Cloth  6 each Topical Q12H   cholecalciferol  1,000 Units Oral Daily   docusate sodium  100 mg Oral BID   [START ON 01/27/2024] fludrocortisone  0.1 mg Oral Daily   Gerhardt's butt cream   Topical BID   lanthanum  500 mg Oral TID WC   midodrine  20 mg Oral TID WC   mirtazapine  7.5 mg Oral QHS   multivitamin  1 tablet Oral BID   polyethylene glycol  17 g Oral Daily   senna-docusate  1 tablet Oral QHS  sertraline  25 mg Oral Daily   sodium chloride flush  10-40 mL Intracatheter Q12H   warfarin  4 mg Oral ONCE-1600   Warfarin - Pharmacist Dosing Inpatient   Does not apply q1600    have reviewed scheduled and prn medications.  Physical Exam: General:NAD, comfortable Heart:normal rate, no rub Lungs: Bilateral chest rise with no increased work of breathing Abdomen:soft, Non-tender, non-distended Extremities: trace ankle edema b/l, warm and well-perfused Dialysis Access: Right IJ TDC in place  Kora Groom 01/26/2024,1:42 PM  LOS: 14 days

## 2024-01-26 NOTE — Progress Notes (Signed)
 PROGRESS NOTE   Subjective/Complaints: No new complaints this morning Walked her farthest distance yet! Would like IV to be removed but we will maintain until after dialysis  ROS: Denies fevers, chills,   abdominal pain, SOB, chest pain, new weakness or paraesthesias.      Objective:   No results found.   Recent Labs    01/25/24 0516  WBC 9.1  HGB 11.9*  HCT 37.1  PLT 93*     Recent Labs    01/24/24 1258  NA 127*  K 4.2  CL 86*  CO2 21*  GLUCOSE 114*  BUN 50*  CREATININE 7.86*  CALCIUM 9.7      Intake/Output Summary (Last 24 hours) at 01/26/2024 1102 Last data filed at 01/26/2024 0744 Gross per 24 hour  Intake 160 ml  Output --  Net 160 ml     Pressure Injury 01/12/24 Buttocks Left Deep Tissue Pressure Injury - Purple or maroon localized area of discolored intact skin or blood-filled blister due to damage of underlying soft tissue from pressure and/or shear. Purple DTI 2cm round (Active)  01/12/24 1830  Location: Buttocks  Location Orientation: Left  Staging: Deep Tissue Pressure Injury - Purple or maroon localized area of discolored intact skin or blood-filled blister due to damage of underlying soft tissue from pressure and/or shear.  Wound Description (Comments): Purple DTI 2cm round  Present on Admission: Yes    Physical Exam: Vital Signs Blood pressure 102/65, pulse 81, temperature 97.9 F (36.6 C), resp. rate 16, height 5\' 3"  (1.6 m), weight 65.2 kg, SpO2 96%. Gen: no distress, normal appearing.  Sitting up in w/c with OT.   HEENT: oral mucosa pink and moist, NCAT Cardio: Reg rate and rhythm, no m/r/g appreciated Chest: normal effort, normal rate of breathing, CTAB Abd: soft, normal bowel sounds, non-distended, nonTTP Ext: no edema, +asterixis Psych: pleasant, flat affect Skin: intact; right chest dialysis port C/D/I Neuro: No focal neurologic deficits.  Awake, alert and oriented x 4.   Moving all 4 extremities antigravity and against resistance.  Touch intact.  Reflexes normal.  No tone, stable 2/26   Assessment/Plan: 1. Functional deficits which require 3+ hours per day of interdisciplinary therapy in a comprehensive inpatient rehab setting. Physiatrist is providing close team supervision and 24 hour management of active medical problems listed below. Physiatrist and rehab team continue to assess barriers to discharge/monitor patient progress toward functional and medical goals  Care Tool:  Bathing    Body parts bathed by patient: Right arm, Left arm, Chest, Abdomen, Front perineal area, Right upper leg, Left upper leg, Face, Right lower leg, Left lower leg, Buttocks   Body parts bathed by helper: Left lower leg, Right lower leg, Buttocks     Bathing assist Assist Level: Contact Guard/Touching assist     Upper Body Dressing/Undressing Upper body dressing   What is the patient wearing?: Pull over shirt    Upper body assist Assist Level: Set up assist    Lower Body Dressing/Undressing Lower body dressing      What is the patient wearing?: Pants     Lower body assist Assist for lower body dressing: Contact Guard/Touching assist  Toileting Toileting    Toileting assist Assist for toileting: Contact Guard/Touching assist     Transfers Chair/bed transfer  Transfers assist     Chair/bed transfer assist level: Supervision/Verbal cueing     Locomotion Ambulation   Ambulation assist      Assist level: Supervision/Verbal cueing Assistive device: Walker-rolling Max distance: 153ft   Walk 10 feet activity   Assist     Assist level: Supervision/Verbal cueing Assistive device: Walker-rolling   Walk 50 feet activity   Assist Walk 50 feet with 2 turns activity did not occur: Safety/medical concerns (fatigue)  Assist level: Supervision/Verbal cueing Assistive device: Walker-rolling    Walk 150 feet activity   Assist Walk 150  feet activity did not occur: Safety/medical concerns (fatigue)  Assist level: Supervision/Verbal cueing Assistive device: Walker-rolling    Walk 10 feet on uneven surface  activity   Assist Walk 10 feet on uneven surfaces activity did not occur: Safety/medical concerns (fatigue)         Wheelchair     Assist Is the patient using a wheelchair?: Yes Type of Wheelchair: Manual    Wheelchair assist level: Dependent - Patient 0% Max wheelchair distance: 62ft    Wheelchair 50 feet with 2 turns activity    Assist        Assist Level: Dependent - Patient 0%   Wheelchair 150 feet activity     Assist      Assist Level: Dependent - Patient 0%   Blood pressure 102/65, pulse 81, temperature 97.9 F (36.6 C), resp. rate 16, height 5\' 3"  (1.6 m), weight 65.2 kg, SpO2 96%.  Medical Problem List and Plan: 1. Functional deficits secondary cardiab debility complicated by AKI             -patient may shower             -ELOS/Goals: 8-11 days S             continue CIR  Messaged team to set d/c date-- 01/29/24  Placed order for central line removal  2.  Antithrombotics: -DVT/anticoagulation:  Pharmaceutical: continue Coumadin, INR reviewed and stable             -antiplatelet therapy:   3. Pain Management: denies pain. Tylenol prn mild pain, d/c oxycodone  4. Mood/Behavior/Sleep: Team to provide ego support. LCSW to follow for evaluation and support.              -d/c seroquel, continue low dose Remeron 7.5mg  at nights. Melatonin prn.              -antipsychotic agents: Seroquel daily at bedtime--d/c'd.  5. Neuropsych/cognition: This patient is capable of making decisions on her own behalf.  6. Skin/Wound Care: routine pressure relief measures. Monitor wound for healing  7. Overweight: provide dietary educaion  8. Afib/A flutter: Monitor HR TID and for symptoms with activity.  -continue Amiodarone 200 mg daily with coumadin. -Heart rate well-controlled  9.  Hypotension: TEDs. Midodrine 20 mg TID. KHT for support. Increase florinef to 0.1mg  daily  Vitals:   01/24/24 1823 01/24/24 1847 01/24/24 1954 01/24/24 2131  BP: 109/68 (!) 98/58 (!) 87/62 90/60   01/25/24 0438 01/25/24 0451 01/25/24 0632 01/25/24 1000  BP: (!) 85/55 (!) 88/60 99/63 99/70    01/25/24 1457 01/25/24 1936 01/25/24 2113 01/26/24 0425  BP: 114/72 (!) 85/57 90/60 102/65     10. Acute on CKD: Now on HD MWF due to ischemic ATN/shock. Schedule HD at the end of the  day to help with tolerance of therapy             --continue Phoslo for now--should improve with renal restrictions.   Continue fluid restriction    11. Acute on chronic anemia: continue Aranesp weekly    12. HFrEF: Strict I/O. Daily weights. Fluid status being managed with HD.   Reviewed weights and they are decreased Filed Weights   01/24/24 1823 01/25/24 0440 01/26/24 0600  Weight: (S) 69 kg 68.2 kg 65.2 kg    22. Depression: continue Zoloft 25mg  daily, Remeron 7.5mg  daily; seroquel d/c'd. Provide ego support.    23. Constipation: Increase colace to BID, miralax daily, decrease senna-docusate to 1 tab HS  24. Fatigue: improved, fludricortisone restarted due to symptoms concerning for orthostasis, continue  25. Type 2 diabetes: d/c feeding supplement  Improved, d/c CBGs  CBG (last 3)  Recent Labs    01/25/24 0559 01/25/24 2042 01/26/24 0614  GLUCAP 103* 148* 106*     26.  Nausea/vomiting.  Resolved  27. Blurred vision: discussed could be 2/2 hypotension, resolved, fludricortisone restarted  LOS: 14 days A FACE TO FACE EVALUATION WAS PERFORMED  Lyndee Herbst P Leni Pankonin 01/26/2024, 11:02 AM

## 2024-01-26 NOTE — Progress Notes (Signed)
 Occupational Therapy Session Note  Patient Details  Name: Vanessa [name] MRN: [id number] Date of Birth: Apr 10, 1951  Today's Date: 01/26/2024 OT Individual Time: 0805-0900 OT Individual Time Calculation (min): 55 min    Short Term Goals: Week 2:  OT Short Term Goal 1 (Week 2): STG = LTG due to ELOS  Skilled Therapeutic Interventions/Progress Updates:  Skilled OT intervention completed with focus on functional transfers, dynamic balance, ambulatory endurance, BUE strengthening. Pt received seated at sink with pants halfway up, agreeable to session. No pain reported.  Pt voiced she got up this AM and addressed her toileting and bathing/dressing needs prior to session but needed assist to pull pants over hips. Required min A using RW for sit > stand then close supervision for donning over hips. Nurse present for meds. Transported dependently in w/c <> gym.  Pt participated in the following dynamic standing balance and endurance tasks to promote independence and safety during BADLs and functional mobility: -cornhole toss activity. Min A sit > stand x2 with RW, and CGA needed for balance with RW in front of pt however pt not using. No LOB, however seated rest breaks provided for fatigue.  Min A sit > stand with RW, then ambulated 172 ft with RW with close supervision with w/c brought behind for fatigue but did not use. Extended rest break needed for fatigue.  Seated EOM, pt completed the following BUE exercises to promote BUE strength/endurance needed for independence with functional transfers and BADLs: (With 3 lb dumbbell) -2x12 bicep curls  Min A sit > stand and CGA stand pivot with RW > w/c. Back in room pt remained seated in w/c, with chair alarm on/activated, and with all needs in reach at end of session.    Therapy Documentation Precautions:  Precautions Precautions: Fall, Sternal Restrictions Weight Bearing Restrictions Per Provider Order: No Other Position/Activity  Restrictions: sternal precautions    Therapy/Group: Individual Therapy  Kalissa Grays E Chanoch Mccleery, MS, OTR/L  01/26/2024, 9:00 AM

## 2024-01-26 NOTE — Progress Notes (Signed)
 Physical Therapy Session Note  Patient Details  Name: Vanessa [name] MRN: [id number] Date of Birth: 04/25/1951  Today's Date: 01/26/2024 PT Individual Time: 1112-1155 PT Individual Time Calculation (min): 43 min   Short Term Goals: Week 2:  PT Short Term Goal 1 (Week 2): STG=LTG due to LOS  Skilled Therapeutic Interventions/Progress Updates: Patient sitting in WC on entrance to room. Patient alert and agreeable to PT session.   Patient reported no pain, but did report feeling fatigued from heavy therapy sessions and an upset stomach (has had BM earlier).   Therapeutic Activity: Bed Mobility: Pt performed sit from EOB<supine with modA.  Transfers: Pt performed sit<stand from WC<EOB with heavy modA to stand due to fatigue, and supervision to transfer. VC required for anterior weight shift.  - Attempted car transfer and sit to stand from WC<edge of mat to work on sit to standing tolerance with pt requiring at least maxA to initiate stand due to fatigue. Pt required multimodal cues to increase anterior weight shift to utilize momentum, but still unable to power up.   Therapeutic Exercise: Pt performed the following exercises with therapist providing the described cuing and facilitation for improvement. - Kinetron emphasis on quad/HS (pedal dialed to one) and 10 CM/SEC - 2 min 48s with rest break required, then 1 min 15 seconds with rest break required, then 2 min 30s with rest break to follow. Pt requested return to room due to fatigue.  Patient supine in bed at end of session with brakes locked, and all needs within reach.      Therapy Documentation Precautions:  Precautions Precautions: Fall, Sternal Restrictions Weight Bearing Restrictions Per Provider Order: No LUE Weight Bearing Per Provider Order: Non weight bearing Other Position/Activity Restrictions: sternal precautions   Therapy/Group: Individual Therapy  Chyann Ambrocio PTA 01/26/2024, 12:53 PM

## 2024-01-26 NOTE — Progress Notes (Signed)
 Contacted [facility] to confirm plans remain for pt to d/c Saturday and start at clinic on Monday. HD arrangements added to AVS. Contacted renal NP regarding clinic's need for orders. Will assist as needed.   Olivia Canter Renal Navigator [phone]

## 2024-01-26 NOTE — Plan of Care (Signed)
  Problem: Consults Goal: RH GENERAL PATIENT EDUCATION Description: See Patient Education module for education specifics. Outcome: Progressing   Problem: RH BOWEL ELIMINATION Goal: RH STG MANAGE BOWEL WITH ASSISTANCE Description: STG Manage Bowel with toileting Assistance. Outcome: Progressing Goal: RH STG MANAGE BOWEL W/MEDICATION W/ASSISTANCE Description: STG Manage Bowel with Medication with mod I Assistance. Outcome: Progressing   Problem: RH SKIN INTEGRITY Goal: RH STG SKIN FREE OF INFECTION/BREAKDOWN Description: Manage skin w min assist Outcome: Progressing Goal: RH STG MAINTAIN SKIN INTEGRITY WITH ASSISTANCE Description: STG Maintain Skin Integrity With min  Assistance. Outcome: Progressing Goal: RH STG ABLE TO PERFORM INCISION/WOUND CARE W/ASSISTANCE Description: STG Able To Perform Incision/Wound Care With min Assistance. Outcome: Progressing   Problem: RH SAFETY Goal: RH STG ADHERE TO SAFETY PRECAUTIONS W/ASSISTANCE/DEVICE Description: STG Adhere to Safety Precautions With cues Assistance/Device. Outcome: Progressing   Problem: RH KNOWLEDGE DEFICIT GENERAL Goal: RH STG INCREASE KNOWLEDGE OF SELF CARE AFTER HOSPITALIZATION Description: Patient and family will be able to manage care at discharge using educational resources for medication and dietary modification independently Outcome: Progressing

## 2024-01-27 LAB — GLUCOSE, CAPILLARY: Glucose-Capillary: 122 mg/dL — ABNORMAL HIGH (ref 70–99)

## 2024-01-27 LAB — COMPREHENSIVE METABOLIC PANEL
ALT: 27 U/L (ref 0–44)
AST: 28 U/L (ref 15–41)
Albumin: 3.2 g/dL — ABNORMAL LOW (ref 3.5–5.0)
Alkaline Phosphatase: 50 U/L (ref 38–126)
Anion gap: 12 (ref 5–15)
BUN: 19 mg/dL (ref 8–23)
CO2: 27 mmol/L (ref 22–32)
Calcium: 8.9 mg/dL (ref 8.9–10.3)
Chloride: 91 mmol/L — ABNORMAL LOW (ref 98–111)
Creatinine, Ser: 4.55 mg/dL — ABNORMAL HIGH (ref 0.44–1.00)
GFR, Estimated: 10 mL/min — ABNORMAL LOW (ref >60)
Glucose, Bld: 96 mg/dL (ref 70–99)
Potassium: 3.7 mmol/L (ref 3.5–5.1)
Sodium: 130 mmol/L — ABNORMAL LOW (ref 135–145)
Total Bilirubin: 2.9 mg/dL — ABNORMAL HIGH (ref 0.0–1.2)
Total Protein: 6.2 g/dL — ABNORMAL LOW (ref 6.5–8.1)

## 2024-01-27 LAB — CBC
HCT: 32.5 % — ABNORMAL LOW (ref 36.0–46.0)
Hemoglobin: 10.4 g/dL — ABNORMAL LOW (ref 12.0–15.0)
MCH: 30.4 pg (ref 26.0–34.0)
MCHC: 32 g/dL (ref 30.0–36.0)
MCV: 95 fL (ref 80.0–100.0)
Platelets: 86 10*3/uL — ABNORMAL LOW (ref 150–400)
RBC: 3.42 MIL/uL — ABNORMAL LOW (ref 3.87–5.11)
RDW: 16 % — ABNORMAL HIGH (ref 11.5–15.5)
WBC: 7.4 10*3/uL (ref 4.0–10.5)
nRBC: 0 % (ref 0.0–0.2)

## 2024-01-27 LAB — PROTIME-INR
INR: 1.8 — ABNORMAL HIGH (ref 0.8–1.2)
Prothrombin Time: 21.3 s — ABNORMAL HIGH (ref 11.4–15.2)

## 2024-01-27 MED ORDER — LANTHANUM CARBONATE 500 MG PO CHEW
500.0000 mg | CHEWABLE_TABLET | Freq: Three times a day (TID) | ORAL | 0 refills | Status: DC
  Filled 2024-01-27: qty 90, 30d supply, fill #0

## 2024-01-27 MED ORDER — DOCUSATE SODIUM 100 MG PO CAPS
200.0000 mg | ORAL_CAPSULE | Freq: Two times a day (BID) | ORAL | 0 refills | Status: DC
  Filled 2024-01-27: qty 60, 15d supply, fill #0

## 2024-01-27 MED ORDER — RENA-VITE PO TABS
1.0000 | ORAL_TABLET | Freq: Two times a day (BID) | ORAL | 0 refills | Status: DC
  Filled 2024-01-27: qty 30, 15d supply, fill #0

## 2024-01-27 MED ORDER — AMIODARONE HCL 200 MG PO TABS: 200.0000 mg | ORAL_TABLET | Freq: Every day | ORAL | 0 refills | Status: DC

## 2024-01-27 MED ORDER — SENNOSIDES-DOCUSATE SODIUM 8.6-50 MG PO TABS
1.0000 | ORAL_TABLET | Freq: Every day | ORAL | 0 refills | Status: DC
  Filled 2024-01-27: qty 30, 30d supply, fill #0

## 2024-01-27 MED ORDER — SERTRALINE HCL 25 MG PO TABS: 25.0000 mg | ORAL_TABLET | Freq: Every day | ORAL | 0 refills | Status: DC

## 2024-01-27 MED ORDER — SALINE SPRAY 0.65 % NA SOLN: 1.0000 | NASAL | Status: DC | PRN

## 2024-01-27 MED ORDER — WARFARIN SODIUM 5 MG PO TABS
5.0000 mg | ORAL_TABLET | Freq: Once | ORAL | Status: AC
  Administered 2024-01-27: 5 mg via ORAL
  Filled 2024-01-27: qty 1

## 2024-01-27 MED ORDER — ACETAMINOPHEN 325 MG PO TABS: 325.0000 mg | ORAL_TABLET | ORAL | Status: DC | PRN

## 2024-01-27 MED ORDER — MIDODRINE HCL 10 MG PO TABS: 20.0000 mg | ORAL_TABLET | Freq: Three times a day (TID) | ORAL | 0 refills | Status: DC

## 2024-01-27 MED ORDER — MIRTAZAPINE 7.5 MG PO TABS
7.5000 mg | ORAL_TABLET | Freq: Every day | ORAL | 0 refills | Status: DC
  Filled 2024-01-27: qty 30, 30d supply, fill #0

## 2024-01-27 MED ORDER — ASCORBIC ACID 500 MG PO TABS
250.0000 mg | ORAL_TABLET | Freq: Two times a day (BID) | ORAL | 0 refills | Status: AC
  Filled 2024-01-27: qty 30, 30d supply, fill #0

## 2024-01-27 MED ORDER — WARFARIN SODIUM 4 MG PO TABS
4.0000 mg | ORAL_TABLET | Freq: Once | ORAL | 0 refills | Status: DC
  Filled 2024-01-27: qty 30, 30d supply, fill #0

## 2024-01-27 MED ORDER — FLUDROCORTISONE ACETATE 0.1 MG PO TABS
0.1000 mg | ORAL_TABLET | Freq: Every day | ORAL | 0 refills | Status: DC
  Filled 2024-01-27: qty 30, 30d supply, fill #0

## 2024-01-27 MED ORDER — POLYETHYLENE GLYCOL 3350 17 GM/SCOOP PO POWD
17.0000 g | Freq: Every day | ORAL | 0 refills | Status: DC | PRN
  Filled 2024-01-27: qty 476, 28d supply, fill #0

## 2024-01-27 MED ORDER — LIDOCAINE-PRILOCAINE 2.5-2.5 % EX CREA
1.0000 | TOPICAL_CREAM | CUTANEOUS | 0 refills | Status: DC | PRN
  Filled 2024-01-27: qty 30, 30d supply, fill #0

## 2024-01-27 NOTE — Progress Notes (Signed)
 Patient ID: Vanessa [name], female   DOB: March 03, 1951, 73 y.o.   MRN: [id number]  Met with pt to give her the [employer] number to call to pay her co-pay for her equipment. She will call but has no card here. Her sister is coming into town on Friday, so happy about this. Encouraged pt to call to fond out the co-pay so her equipment can be delivered.

## 2024-01-27 NOTE — Progress Notes (Signed)
 Occupational Therapy Session Note  Patient Details  Name: Vanessa [name] MRN: [id number] Date of Birth: 1951/10/17  Today's Date: 01/27/2024 OT Individual Time: 7829-5621 OT Individual Time Calculation (min): 55 min    Short Term Goals: Week 2:  OT Short Term Goal 1 (Week 2): STG = LTG due to ELOS  Skilled Therapeutic Interventions/Progress Updates:  Skilled OT intervention completed with focus on ambulatory endurance, BUE strengthening. Pt received seated in w/c, agreeable to session. No pain reported.  Pt reported she got up with nursing and addressed her BADLs. Transported dependently in w/c <> gym.   Min A sit > stand using RW, then ambulated 166 ft + 120 ft with close supervision using RW with w/c brought for fatigue and intermittent seated rest break between trials. Cues needed for maintaining body proximity to RW vs leaning on RW with onset of fatigue and for pursed lip breathing as pt with mouth breathing.  Seated in w/c, pt completed the following BUE exercises to promote BUE strength/endurance needed for independence with functional transfers and BADLs: (With 3 lb dumbbell) -2x15 chest press -2x15 bicep curls; each arm  Back in room, pt remained seated in w/c, with chair alarm on/activated, and with all needs in reach at end of session.   Therapy Documentation Precautions:  Precautions Precautions: Fall, Sternal Restrictions Weight Bearing Restrictions Per Provider Order: No Other Position/Activity Restrictions: sternal precautions    Therapy/Group: Individual Therapy  [name], MS, OTR/L  01/27/2024, 9:54 AM

## 2024-01-27 NOTE — Group Note (Signed)
 Patient Details Name: Vanessa [name] MRN: [id number] DOB: 1951-06-06 Today's Date: 01/27/2024  Time Calculation: OT Group Time Calculation OT Group Start Time: 1430 OT Group Stop Time: 1530 OT Group Time Calculation (min): 60 min      Group Description: Dance Group: Pt participated in dance group with an emphasis on social interaction, motor planning, increasing overall activity tolerance and bimanual tasks. All songs were selected by group members. Dance moves included AROM of BUE/BLE gross motor movements with an emphasis on building functional endurance.    Individual level documentation: Patient completed group from sitting level. Patientt needed supervision to complete various dance moves with OT providing visual model.  Patient able to create her own modifications modifications during group.  Pain:  0/10  Precautions: Precautions: Fall, Sternal  [name] 01/27/2024, 3:47 PM

## 2024-01-27 NOTE — Progress Notes (Signed)
 PHARMACY - ANTICOAGULATION CONSULT NOTE  Pharmacy Consult for Warfarin Indication: atrial fibrillation  Allergies  Allergen Reactions   Spironolactone     hyperkalemia    Patient Measurements: Height: 5\' 3"  (160 cm) Weight: 70.3 kg (154 lb 15.7 oz) IBW/kg (Calculated) : 52.4  Vital Signs: Temp: 97.8 F (36.6 C) (02/27 1333) Temp Source: Oral (02/27 1333) BP: 85/57 (02/27 1333) Pulse Rate: 84 (02/27 1333)  Labs: Recent Labs    01/25/24 0516 01/26/24 0528 01/26/24 1414 01/27/24 0546  HGB 11.9*  --  10.8* 10.4*  HCT 37.1  --  33.7* 32.5*  PLT 93*  --  102* 86*  LABPROT 22.2* 21.6*  --  21.3*  INR 1.9* 1.9*  --  1.8*  CREATININE  --   --  7.05* 4.55*    Estimated Creatinine Clearance: 10.5 mL/min (A) (by C-G formula based on SCr of 4.55 mg/dL (H)).  Assessment: 73 yo female s/p scheduled bioprosthetic mitral valve replacement with tissue valve 12/30 now with afib, pharmacy asked to dose anticoagulation with IV heparin while inpatient. Spontaneously converted 1/7; flipped back to AF overnight 1/9 0300. Baseline INR 1.1, slow uptrend to 1.3 s/p 6 days warfarin therapy, total dose 20 mg, then held 1/21-24 for tunneled access plans. Patient now s/p [facility] and tunneled central line placement 1/24 AM on CRRT, plan HD MWF. Off norepinephrine since 1/26, remains on midodrine. Resumed anticoagulation per VIR after tunneled cath placement. Heparin stopped 1/31 with INR >2.   INR subtherapeutic 1.8 after 1.9 x 2 days, and 4 mg doses given. INR had been therapeutic on 3 mg daily, then INR trended up to 3.0-3.1 on 2/14 thru 2/16  and warfarin doses were reduced. INR then subtherapeutic 1.7 on 2/20 and doses increased to 3-4 mg.  Hgb was stable 11.9 on 2/25, but has trended down some. Platelet count has decreased from 150 to 93>102>86. Previously low during this admission.  No bleeding reported. T bili down to 2.9 from last prior 7.4 on 1/16 and max 15.2 on 1/9.  Continues on Amiodarone,  which can increase sensitivity to warfarin, but on amiodarone prior to warfarin initiation.   Goal of Therapy:  INR 2-3 (bMVR) Monitor platelets by anticoagulation protocol: Yes   Plan:  Warfarin 5 mg x 1 today. Daily PT/INR; decrease frequency when stable. Follow up CBCs with HD labs; watch platelet count. Monitor for signs/symptoms of bleeding.  Dennie Fetters, RPh 01/27/2024,2:34 PM

## 2024-01-27 NOTE — Progress Notes (Signed)
 Physical Therapy Session Note  Patient Details  Name: Vanessa [name] MRN: [id number] Date of Birth: [birth year]  Today's Date: 01/27/2024 PT Individual Time: 4098-1191 PT Individual Time Calculation (min): 71 min   Short Term Goals: Week 1:  PT Short Term Goal 1 (Week 1): pt will transfer supine<>sitting EOB with CGA PT Short Term Goal 1 - Progress (Week 1): Met PT Short Term Goal 2 (Week 1): pt will transfer sit<>stand with LRAD and min A consistantly PT Short Term Goal 2 - Progress (Week 1): Progressing toward goal PT Short Term Goal 3 (Week 1): pt will ambulate 51ft with LRAD and CGA PT Short Term Goal 3 - Progress (Week 1): Met Week 2:  PT Short Term Goal 1 (Week 2): STG=LTG due to LOS  Skilled Therapeutic Interventions/Progress Updates:   Received pt sitting in WC, pt agreeable to PT treatment, and denied any pain during session but reported feeling extremely nauseous and fatigued. Session with emphasis on discharge planning, functional mobility/transfers, generalized strengthening and endurance, and standing tolerance.   Pt politely declined need to take videos to show her sister how to assist with transfers at home. Pt verbalized confidence with directing care, reporting instructing new NT the other night how to assist her. Also discussed return to driving (pt discussed with MD briefly). Recommended getting opinion of HH therapies first, then starting off in empty parking lot with family member, then progressing to neighborhoods, then interstate driving.   Pt transported to/from room in [facility] dependently for time management purposes. Stood at table in dayroom with 1UE support and heavy mod A x 2 trials and worked on standing tolerance while recreating pictures on pegboard with supervision for balance - cues to put all weight through BLEs and decrease UE reliance. Pt required min cues for spatial awareness and selecting correct colors (getting reds and orange confused). Trial 1: 15 minutes  and 15 seconds Trial 2: 3 minutes and 38 seconds  Pt politely declined any ambulation due to fatigue and nausea. Pt performed seated knee extension 2x15 bilaterally, seated hip flexion 2x15 bilaterally with 1.5lb ankle weight, and hip adduction ball squeezes 2x10 with 5 second isometric hold. Returned to room and concluded session with pt sitting in WC, needs within reach, and chair pad alarm on.   Therapy Documentation Precautions:  Precautions Precautions: Fall, Sternal Restrictions Weight Bearing Restrictions Per Provider Order: No LUE Weight Bearing Per Provider Order: Non weight bearing Other Position/Activity Restrictions: sternal precautions  Therapy/Group: Individual Therapy Marlana Salvage Zaunegger Blima Rich PT, DPT 01/27/2024, 6:54 AM

## 2024-01-27 NOTE — Progress Notes (Signed)
 [facility] NEPHROLOGY PROGRESS NOTE  Assessment/ Plan: Pt is a 73 y.o. yo female     1.  AKI on CKD 3 yea: ATN from shock.  Maintain dialysis on MWF schedule.  Accepted to [facility] MWF 12:10 pm chair time. Pt will need to arrive at 11:15 am for paperwork on first day.   2.  Severe mitral regurgitation/tricuspid regurgitation: Status post repair with postoperative cardiac tamponade.  Additional management per cardiothoracic surgery. Stable at this time  3.  Acute exacerbation of congestive heart failure with reduced ejection fraction: Volume status significantly improved with ongoing hemodialysis/ultrafiltration.  4.  Shock: From a combination of cardiogenic and vasoplegic etiology-currently on midodrine after previously requiring pressor/inotropic support. Now on high dose midodrine.  Would give before therapy & HD  5.  Hyperphosphatemia: Last phosphorus at goal. Cont binder  6. Anemia of CKD; last Hb at goal.   7. Hyponatremia: attempting to manage with HD and UF  Subjective: Patient seen and examined bedside. No acute events  Objective Vital signs in last 24 hours: Vitals:   01/26/24 1754 01/26/24 1759 01/26/24 2038 01/27/24 0534  BP:  (!) 90/54 (!) 85/55 99/60  Pulse:  79 86 83  Resp:  19  18  Temp:    (!) 97.5 F (36.4 C)  TempSrc:      SpO2:  93%  93%  Weight: 69.6 kg   70.3 kg  Height:       Weight change: 5.4 kg  Intake/Output Summary (Last 24 hours) at 01/27/2024 1229 Last data filed at 01/26/2024 1750 Gross per 24 hour  Intake --  Output 1000 ml  Net -1000 ml       Labs: RENAL PANEL Recent Labs  Lab 01/24/24 1258 01/26/24 1414 01/27/24 0546  NA 127* 124* 130*  K 4.2 3.8 3.7  CL 86* 88* 91*  CO2 21* 21* 27  GLUCOSE 114* 131* 96  BUN 50* 39* 19  CREATININE 7.86* 7.05* 4.55*  CALCIUM 9.7 8.8* 8.9  PHOS  --  2.7  --   ALBUMIN  --  2.9* 3.2*    Liver Function Tests: Recent Labs  Lab 01/26/24 1414 01/27/24 0546  AST  --  28  ALT   --  27  ALKPHOS  --  50  BILITOT  --  2.9*  PROT  --  6.2*  ALBUMIN 2.9* 3.2*    No results for input(s): "LIPASE", "AMYLASE" in the last 168 hours. No results for input(s): "AMMONIA" in the last 168 hours. CBC: Recent Labs    01/15/24 1304 01/18/24 1333 01/25/24 0516 01/26/24 1414 01/27/24 0546  HGB 12.4 11.5* 11.9* 10.8* 10.4*  MCV 96.1 95.2 93.7 93.6 95.0    Cardiac Enzymes: No results for input(s): "CKTOTAL", "CKMB", "CKMBINDEX", "TROPONINI" in the last 168 hours. CBG: Recent Labs  Lab 01/25/24 2042 01/26/24 0614 01/26/24 1223 01/26/24 2035 01/27/24 0605  GLUCAP 148* 106* 76 168* 122*    Iron Studies: No results for input(s): "IRON", "TIBC", "TRANSFERRIN", "FERRITIN" in the last 72 hours. Studies/Results: No results found.    Medications: Infusions:  anticoagulant sodium citrate      Scheduled Medications:  amiodarone  200 mg Oral Daily   ascorbic acid  250 mg Oral BID   Chlorhexidine Gluconate Cloth  6 each Topical Q12H   cholecalciferol  1,000 Units Oral Daily   docusate sodium  100 mg Oral BID   fludrocortisone  0.1 mg Oral Daily   Gerhardt's butt cream   Topical  BID   lanthanum  500 mg Oral TID WC   midodrine  20 mg Oral TID WC   mirtazapine  7.5 mg Oral QHS   multivitamin  1 tablet Oral BID   polyethylene glycol  17 g Oral Daily   senna-docusate  1 tablet Oral QHS   sertraline  25 mg Oral Daily   sodium chloride flush  10-40 mL Intracatheter Q12H   Warfarin - Pharmacist Dosing Inpatient   Does not apply q1600    have reviewed scheduled and prn medications.  Physical Exam: General:NAD, comfortable, in chair Heart:normal rate, no rub Lungs: Bilateral chest rise with no increased work of breathing Abdomen:soft, Non-tender, non-distended Extremities: trace ankle edema b/l, warm and well-perfused Dialysis Access: Right IJ TDC in place  Vanessa [name] 01/27/2024,12:29 PM  LOS: 15 days

## 2024-01-27 NOTE — Progress Notes (Signed)
 PROGRESS NOTE   Subjective/Complaints: She asks for IV and left chest wall dressing to be removed Hgb reviewed and slightly decreased Ambulating close to 300 feet S!  ROS: Denies fevers, chills,   abdominal pain, SOB, chest pain, new weakness or paraesthesias.      Objective:   No results found.   Recent Labs    01/26/24 1414 01/27/24 0546  WBC 9.4 7.4  HGB 10.8* 10.4*  HCT 33.7* 32.5*  PLT 102* 86*     Recent Labs    01/26/24 1414 01/27/24 0546  NA 124* 130*  K 3.8 3.7  CL 88* 91*  CO2 21* 27  GLUCOSE 131* 96  BUN 39* 19  CREATININE 7.05* 4.55*  CALCIUM 8.8* 8.9      Intake/Output Summary (Last 24 hours) at 01/27/2024 1118 Last data filed at 01/26/2024 1750 Gross per 24 hour  Intake --  Output 1000 ml  Net -1000 ml     Pressure Injury 01/12/24 Buttocks Left Deep Tissue Pressure Injury - Purple or maroon localized area of discolored intact skin or blood-filled blister due to damage of underlying soft tissue from pressure and/or shear. Purple DTI 2cm round (Active)  01/12/24 1830  Location: Buttocks  Location Orientation: Left  Staging: Deep Tissue Pressure Injury - Purple or maroon localized area of discolored intact skin or blood-filled blister due to damage of underlying soft tissue from pressure and/or shear.  Wound Description (Comments): Purple DTI 2cm round  Present on Admission: Yes    Physical Exam: Vital Signs Blood pressure 99/60, pulse 83, temperature (!) 97.5 F (36.4 C), resp. rate 18, height 5\' 3"  (1.6 m), weight 70.3 kg, SpO2 93%. Gen: no distress, normal appearing.  Sitting up in w/c with OT.   HEENT: oral mucosa pink and moist, NCAT Cardio: Reg rate and rhythm, no m/r/g appreciated Chest: normal effort, normal rate of breathing, CTAB Abd: soft, normal bowel sounds, non-distended, nonTTP Ext: no edema, +asterixis Psych: pleasant, flat affect Skin: intact; right chest dialysis  port C/D/I Neuro: No focal neurologic deficits.  Awake, alert and oriented x 4.  Moving all 4 extremities antigravity and against resistance.  Touch intact.  Reflexes normal.  No tone, stable 2/27   Assessment/Plan: 1. Functional deficits which require 3+ hours per day of interdisciplinary therapy in a comprehensive inpatient rehab setting. Physiatrist is providing close team supervision and 24 hour management of active medical problems listed below. Physiatrist and rehab team continue to assess barriers to discharge/monitor patient progress toward functional and medical goals  Care Tool:  Bathing    Body parts bathed by patient: Right arm, Left arm, Chest, Abdomen, Front perineal area, Right upper leg, Left upper leg, Face, Right lower leg, Left lower leg, Buttocks   Body parts bathed by helper: Left lower leg, Right lower leg, Buttocks     Bathing assist Assist Level: Contact Guard/Touching assist     Upper Body Dressing/Undressing Upper body dressing   What is the patient wearing?: Pull over shirt    Upper body assist Assist Level: Set up assist    Lower Body Dressing/Undressing Lower body dressing      What is the patient wearing?: Pants  Lower body assist Assist for lower body dressing: Contact Guard/Touching assist     Toileting Toileting    Toileting assist Assist for toileting: Contact Guard/Touching assist     Transfers Chair/bed transfer  Transfers assist     Chair/bed transfer assist level: Supervision/Verbal cueing     Locomotion Ambulation   Ambulation assist      Assist level: Supervision/Verbal cueing Assistive device: Walker-rolling Max distance: 111ft   Walk 10 feet activity   Assist     Assist level: Supervision/Verbal cueing Assistive device: Walker-rolling   Walk 50 feet activity   Assist Walk 50 feet with 2 turns activity did not occur: Safety/medical concerns (fatigue)  Assist level: Supervision/Verbal  cueing Assistive device: Walker-rolling    Walk 150 feet activity   Assist Walk 150 feet activity did not occur: Safety/medical concerns (fatigue)  Assist level: Supervision/Verbal cueing Assistive device: Walker-rolling    Walk 10 feet on uneven surface  activity   Assist Walk 10 feet on uneven surfaces activity did not occur: Safety/medical concerns (fatigue)         Wheelchair     Assist Is the patient using a wheelchair?: Yes Type of Wheelchair: Manual    Wheelchair assist level: Dependent - Patient 0% Max wheelchair distance: 66ft    Wheelchair 50 feet with 2 turns activity    Assist        Assist Level: Dependent - Patient 0%   Wheelchair 150 feet activity     Assist      Assist Level: Dependent - Patient 0%   Blood pressure 99/60, pulse 83, temperature (!) 97.5 F (36.4 C), resp. rate 18, height 5\' 3"  (1.6 m), weight 70.3 kg, SpO2 93%.  Medical Problem List and Plan: 1. Functional deficits secondary cardiab debility complicated by AKI             -patient may shower             -ELOS/Goals: 8-11 days S             continue CIR  Messaged team to set d/c date-- 01/29/24  Placed order for central line removal  2.  Antithrombotics: -DVT/anticoagulation:  Pharmaceutical: continue Coumadin, INR reviewed and stable             -antiplatelet therapy:   3. Pain Management: denies pain. Tylenol prn mild pain, d/c oxycodone  4. Mood/Behavior/Sleep: Team to provide ego support. LCSW to follow for evaluation and support.              -d/c seroquel, continue low dose Remeron 7.5mg  at nights. Melatonin prn.              -antipsychotic agents: Seroquel daily at bedtime--d/c'd.  5. Neuropsych/cognition: This patient is capable of making decisions on her own behalf.  6. Skin/Wound Care: routine pressure relief measures. Monitor wound for healing  7. Overweight: provide dietary educaion  8. Afib/A flutter: Monitor HR TID and for symptoms with  activity.  -continue Amiodarone 200 mg daily with coumadin. -Heart rate well-controlled  9. Hypotension: TEDs. Midodrine 20 mg TID. KHT for support. Increase florinef to 0.1mg  daily  Vitals:   01/26/24 1420 01/26/24 1430 01/26/24 1500 01/26/24 1530  BP: (!) 86/61 102/66 103/70 92/71   01/26/24 1600 01/26/24 1630 01/26/24 1700 01/26/24 1730  BP: (!) 92/56 98/68 97/62  (!) 90/59   01/26/24 1750 01/26/24 1759 01/26/24 2038 01/27/24 0534  BP: (!) 92/56 (!) 90/54 (!) 85/55 99/60     10. Acute  on CKD: Now on HD MWF due to ischemic ATN/shock. Schedule HD at the end of the day to help with tolerance of therapy             --continue Phoslo for now--should improve with renal restrictions.   Continue fluid restriction    11. Acute on chronic anemia: continue Aranesp weekly    12. HFrEF: Strict I/O. Daily weights. Fluid status being managed with HD.   Reviewed weights and they are stable [facility] Weights   01/26/24 1352 01/26/24 1754 01/27/24 0534  Weight: 70.6 kg 69.6 kg 70.3 kg    22. Depression: continue Zoloft 25mg  daily, Remeron 7.5mg  daily; seroquel d/c'd. Provide ego support.    23. Constipation: Increase colace to BID, miralax daily, decrease senna-docusate to 1 tab HS  24. Fatigue: improved, fludricortisone restarted due to symptoms concerning for orthostasis, continue  25. Type 2 diabetes: d/c feeding supplement  Improved, d/c CBGs  CBG (last 3)  Recent Labs    01/26/24 1223 01/26/24 2035 01/27/24 0605  GLUCAP 76 168* 122*     26.  Nausea/vomiting.  Resolved  27. Blurred vision: discussed could be 2/2 hypotension, resolved, fludricortisone restarted  LOS: 15 days A FACE TO FACE EVALUATION WAS PERFORMED  Guss Farruggia P Ryka Beighley 01/27/2024, 11:18 AM

## 2024-01-27 NOTE — Progress Notes (Signed)
 Occupational Therapy Discharge Summary  Patient Details  Name: Vanessa [name] MRN: [id number] Date of Birth: 11-13-1951  Date of Discharge from OT service:January 28, 2024   Patient has met 7 of 7 long term goals due to improved activity tolerance, improved balance, ability to compensate for deficits, and improved coordination.  Patient to discharge at overall Supervision to min A level.  Patient's care partner is independent to provide the necessary physical assistance at discharge. Pt's sister will be providing 24/7 care, however was unable to attend formal training prior to DC.  All goals met  Recommendation:  Patient will benefit from ongoing skilled OT services in home health setting to continue to advance functional skills in the area of BADL, iADL, and Reduce care partner burden.  Equipment: 3 in 1 BSC  Reasons for discharge: treatment goals met and discharge from hospital  Patient/family agrees with progress made and goals achieved: Yes  OT Discharge Precautions/Restrictions  Precautions Precautions: Fall;Sternal Precaution/Restrictions Comments: monitor BP Restrictions Weight Bearing Restrictions Per Provider Order: No Other Position/Activity Restrictions: sternal precautions ADL ADL Eating: Independent Where Assessed-Eating: Wheelchair Grooming: Independent Where Assessed-Grooming: Sitting at sink Upper Body Bathing: Modified independent Where Assessed-Upper Body Bathing: Sitting at sink Lower Body Bathing: Minimal assistance Where Assessed-Lower Body Bathing: Sitting at sink, Standing at sink Upper Body Dressing: Setup Where Assessed-Upper Body Dressing: Wheelchair Lower Body Dressing: Contact guard Where Assessed-Lower Body Dressing: Sitting at sink, Standing at sink Toileting: Contact guard Where Assessed-Toileting: Teacher, adult education: Close supervision Statistician Method: Proofreader: Raised toilet seat Tub/Shower  Transfer: Not assessed Film/video editor: Not assessed ADL Comments: Pt not a candidate for showers 2/2 HD therefore did not assess shower transfers Vision Baseline Vision/History: 0 No visual deficits Patient Visual Report: No change from baseline Vision Assessment?: No apparent visual deficits Perception  Perception: Within Functional Limits Praxis Praxis: WFL Cognition Cognition Overall Cognitive Status: Within Functional Limits for tasks assessed Arousal/Alertness: Awake/alert Orientation Level: Person;Place;Situation Person: Oriented Place: Oriented Situation: Oriented Memory: Appears intact Awareness: Appears intact Problem Solving: Appears intact Safety/Judgment: Appears intact Brief Interview for Mental Status (BIMS) Repetition of Three Words (First Attempt): 3 Temporal Orientation: Year: Correct Temporal Orientation: Month: Missed by 6 days to 1 month Temporal Orientation: Day: Correct Recall: "Sock": Yes, no cue required Recall: "Blue": Yes, no cue required Recall: "Bed": Yes, no cue required BIMS Summary Score: 14 Sensation Sensation Light Touch: Appears Intact Hot/Cold: Appears Intact Proprioception: Appears Intact Stereognosis: Appears Intact Coordination Gross Motor Movements are Fluid and Coordinated: Yes Fine Motor Movements are Fluid and Coordinated: Yes Coordination and Movement Description: global weakness/deconditioning Finger Nose Finger Test: [employer] but slow Heel Shin Test: decreased ROM bilaterally - improved since eval Motor  Motor Motor: Within Functional Limits Motor - Skilled Clinical Observations: global weakness/deconditioning Mobility  Bed Mobility Bed Mobility: Rolling Right;Rolling Left;Supine to Sit;Sit to Supine Rolling Right: Supervision/verbal cueing Rolling Left: Supervision/Verbal cueing Supine to Sit: Supervision/Verbal cueing Sit to Supine: Minimal Assistance - Patient > 75% Transfers Sit to Stand: Minimal Assistance -  Patient > 75% Stand to Sit: Supervision/Verbal cueing  Trunk/Postural Assessment  Cervical Assessment Cervical Assessment: Within Functional Limits Thoracic Assessment Thoracic Assessment: Exceptions to [facility] (rounded shoulders) Lumbar Assessment Lumbar Assessment: Exceptions to [facility] (posterior pelvic tilt) Postural Control Postural Control: Within Functional Limits  Balance Balance Balance Assessed: Yes Static Sitting Balance Static Sitting - Balance Support: Feet supported;Bilateral upper extremity supported Static Sitting - Level of Assistance: 7: Independent Dynamic Sitting Balance Dynamic Sitting -  Balance Support: Feet supported;No upper extremity supported Dynamic Sitting - Level of Assistance: 6: Modified independent (Device/Increase time) Static Standing Balance Static Standing - Balance Support: Bilateral upper extremity supported;During functional activity (RW) Static Standing - Level of Assistance: 5: Stand by assistance (supervision) Dynamic Standing Balance Dynamic Standing - Balance Support: Bilateral upper extremity supported;During functional activity (RW) Dynamic Standing - Level of Assistance: 5: Stand by assistance (supervision) Dynamic Standing - Comments: with transfers and gait Extremity/Trunk Assessment RUE Assessment General Strength Comments: not tested due to sternal precautions,  pt reports no strength changes in UEs LUE Assessment General Strength Comments: not tested due to sternal precautions, pt reports no strength changes in UEs   [employer], MS, OTR/L  01/28/2024, 2:53 PM

## 2024-01-27 NOTE — Patient Care Conference (Signed)
 Inpatient RehabilitationTeam Conference and Plan of Care Update Date: 01/26/2024   Time: 11:45 AM    Patient Name: Vanessa [name]      Medical Record Number: [id number]  Date of Birth: [birth year] Sex: Female         Room/Bed: 4M08C/4M08C-01 Payor Info: Payor: Arna [employer] ADVANTAGE / Plan: [employer] PPO / Product Type: *No Product type* /    Admit Date/Time:  01/12/2024  5:47 PM  Primary Diagnosis:  Debility  Hospital Problems: Principal Problem:   Debility    Expected Discharge Date: Expected Discharge Date: 01/29/24  Team Members Present: Physician leading conference: Dr. Sula Soda Social Worker Present: Dossie Der, LCSW Nurse Present: Chana Bode, RN PT Present: Blima Rich, PT OT Present: Lou Cal, OT PPS Coordinator present : Fae Pippin, SLP     Current Status/Progress Goal Weekly Team Focus  Bowel/Bladder   Patient continent of bowel and bladder. Last BM 2/25.   Remain continent.   Patient will remain continent.    Swallow/Nutrition/ Hydration               ADL's   Set up A UB, CGA LB, CGA toileting however needs min A to stand from varying surfaces to complete BADLs   Supervision   Barriers- powering up to stand from low-mid surfaces, functional endurance, dynamic standing balance    Mobility   rolling and supine<>sit supervision, sit<>supine min A, sit<>stands min A (mod A from lower surfaces), stand<>pivot transfers CGA/cloes supervision, gait 65ft with RW supervision, 8 3in steps with 2 handrails CGA/min A   supervision, sit<>stands and car transfer downgraded to CGA/min A  barriers: sit<>stands, fear of falling, deconditioning/weakness, and fatigue    Communication                Safety/Cognition/ Behavioral Observations               Pain   No reports of pain.   Patient will remain pain free.   No pain.    Skin   Patient has a left buttock wound.   Treat wound.  Prevent further skin breakdown.       Discharge Planning:  [name] is sister is coming to assist. Have arrnged transport to OP-HD for Monday and gave pt information on how to set up further transport. Have ordered DME and HH set up for DC   Team Discussion: Patient admitted with debility post AKI with new HD.   Patient on target to meet rehab goals: yes, currently needs set up for upper body care and CGA for lower body care and toileting. Needs close supervision for stand pivot transfers and min assist for other transfers and sit - stand. Able to ambulate  up to 30' with supervision and manage 8 3" steps with CGA. Goals for discharge set for supervision overall.  *See Care Plan and progress notes for long and short-term goals.   Revisions to Treatment Plan:  N/A   Teaching Needs: Safety, medications, transfers, toileting, etc.   Current Barriers to Discharge: Decreased caregiver support, Home enviroment access/layout, and Hemodialysis Patient and her sister do not drive Copay required for DME  Possible Resolutions to Barriers: Famiy education [facility] follow up services DME: RW, W/C and BSC SW arranged transport to OP-HD for Monday and gave pt information on how to set up further transport.     Medical Summary Current Status: hypotension, acute on chronic renal failure, depression  Barriers to Discharge: Medical stability  Barriers to Discharge Comments: hypotension,  acute on chronic renal failure, depression Possible Resolutions to [employer] Focus: increase fludricortisone to 0.1mg  daily, continue dialysis, nephrology follow-up, fluid restriction, continue Zoloft/Remeron   Continued Need for Acute Rehabilitation Level of Care: The patient requires daily medical management by a physician with specialized training in physical medicine and rehabilitation for the following reasons: Direction of a multidisciplinary physical rehabilitation program to maximize functional independence : Yes Medical management of  patient stability for increased activity during participation in an intensive rehabilitation regime.: Yes Analysis of laboratory values and/or radiology reports with any subsequent need for medication adjustment and/or medical intervention. : Yes   I attest that I was present, lead the team conference, and concur with the assessment and plan of the team.   Chana Bode B 01/27/2024, 8:14 AM

## 2024-01-28 ENCOUNTER — Other Ambulatory Visit (HOSPITAL_COMMUNITY): Payer: Self-pay

## 2024-01-28 ENCOUNTER — Telehealth (HOSPITAL_COMMUNITY): Payer: Self-pay | Admitting: Pharmacy Technician

## 2024-01-28 LAB — CBC
HCT: 32.6 % — ABNORMAL LOW (ref 36.0–46.0)
Hemoglobin: 10.7 g/dL — ABNORMAL LOW (ref 12.0–15.0)
MCH: 30.7 pg (ref 26.0–34.0)
MCHC: 32.8 g/dL (ref 30.0–36.0)
MCV: 93.7 fL (ref 80.0–100.0)
Platelets: 109 10*3/uL — ABNORMAL LOW (ref 150–400)
RBC: 3.48 MIL/uL — ABNORMAL LOW (ref 3.87–5.11)
RDW: 16 % — ABNORMAL HIGH (ref 11.5–15.5)
WBC: 8.2 10*3/uL (ref 4.0–10.5)
nRBC: 0 % (ref 0.0–0.2)

## 2024-01-28 LAB — RENAL FUNCTION PANEL
Albumin: 3.2 g/dL — ABNORMAL LOW (ref 3.5–5.0)
Anion gap: 14 (ref 5–15)
BUN: 35 mg/dL — ABNORMAL HIGH (ref 8–23)
CO2: 25 mmol/L (ref 22–32)
Calcium: 9 mg/dL (ref 8.9–10.3)
Chloride: 88 mmol/L — ABNORMAL LOW (ref 98–111)
Creatinine, Ser: 6.77 mg/dL — ABNORMAL HIGH (ref 0.44–1.00)
GFR, Estimated: 6 mL/min — ABNORMAL LOW (ref >60)
Glucose, Bld: 100 mg/dL — ABNORMAL HIGH (ref 70–99)
Phosphorus: 2.9 mg/dL (ref 2.5–4.6)
Potassium: 3.7 mmol/L (ref 3.5–5.1)
Sodium: 127 mmol/L — ABNORMAL LOW (ref 135–145)

## 2024-01-28 LAB — PROTIME-INR
INR: 1.7 — ABNORMAL HIGH (ref 0.8–1.2)
Prothrombin Time: 20.3 s — ABNORMAL HIGH (ref 11.4–15.2)

## 2024-01-28 MED ORDER — WARFARIN SODIUM 5 MG PO TABS
5.0000 mg | ORAL_TABLET | Freq: Every day | ORAL | 0 refills | Status: DC
  Filled 2024-01-28: qty 30, 30d supply, fill #0

## 2024-01-28 MED ORDER — MIDODRINE HCL 10 MG PO TABS
20.0000 mg | ORAL_TABLET | Freq: Three times a day (TID) | ORAL | 0 refills | Status: DC
  Filled 2024-01-28: qty 180, 30d supply, fill #0

## 2024-01-28 MED ORDER — WARFARIN SODIUM 5 MG PO TABS
5.0000 mg | ORAL_TABLET | Freq: Once | ORAL | Status: AC
  Administered 2024-01-28: 5 mg via ORAL
  Filled 2024-01-28: qty 1

## 2024-01-28 MED ORDER — AMIODARONE HCL 200 MG PO TABS
200.0000 mg | ORAL_TABLET | Freq: Every day | ORAL | 0 refills | Status: DC
  Filled 2024-01-28: qty 30, 30d supply, fill #0

## 2024-01-28 MED ORDER — SERTRALINE HCL 25 MG PO TABS
25.0000 mg | ORAL_TABLET | Freq: Every day | ORAL | 0 refills | Status: DC
  Filled 2024-01-28: qty 30, 30d supply, fill #0

## 2024-01-28 MED ORDER — WARFARIN SODIUM 4 MG PO TABS
4.0000 mg | ORAL_TABLET | Freq: Every day | ORAL | 0 refills | Status: DC
  Filled 2024-01-28: qty 30, 30d supply, fill #0

## 2024-01-28 MED ORDER — LANTHANUM CARBONATE 500 MG PO CHEW: 500.0000 mg | CHEWABLE_TABLET | Freq: Three times a day (TID) | ORAL | 0 refills | Status: DC

## 2024-01-28 NOTE — Plan of Care (Signed)
°  Problem: RH Balance °Goal: LTG Patient will maintain dynamic standing with ADLs (OT) °Description: LTG:  Patient will maintain dynamic standing balance with assist during activities of daily living (OT)  °Outcome: Completed/Met °  °Problem: Sit to Stand °Goal: LTG:  Patient will perform sit to stand in prep for activites of daily living with assistance level (OT) °Description: LTG:  Patient will perform sit to stand in prep for activites of daily living with assistance level (OT) °Outcome: Completed/Met °  °Problem: RH Bathing °Goal: LTG Patient will bathe all body parts with assist levels (OT) °Description: LTG: Patient will bathe all body parts with assist levels (OT) °Outcome: Completed/Met °  °Problem: RH Dressing °Goal: LTG Patient will perform upper body dressing (OT) °Description: LTG Patient will perform upper body dressing with assist, with/without cues (OT). °Outcome: Completed/Met °Goal: LTG Patient will perform lower body dressing w/assist (OT) °Description: LTG: Patient will perform lower body dressing with assist, with/without cues in positioning using equipment (OT) °Outcome: Completed/Met °  °Problem: RH Toileting °Goal: LTG Patient will perform toileting task (3/3 steps) with assistance level (OT) °Description: LTG: Patient will perform toileting task (3/3 steps) with assistance level (OT)  °Outcome: Completed/Met °  °Problem: RH Toilet Transfers °Goal: LTG Patient will perform toilet transfers w/assist (OT) °Description: LTG: Patient will perform toilet transfers with assist, with/without cues using equipment (OT) °Outcome: Completed/Met °  °

## 2024-01-28 NOTE — Progress Notes (Signed)
 PROGRESS NOTE   Subjective/Complaints: No new complaints this morning Asks for list of foods that are good to eat for dialysis patients, have printed to give her tomorrow  ROS: Denies fevers, chills,   abdominal pain, SOB, chest pain, new weakness or paraesthesias.      Objective:   No results found.   Recent Labs    01/26/24 1414 01/27/24 0546  WBC 9.4 7.4  HGB 10.8* 10.4*  HCT 33.7* 32.5*  PLT 102* 86*     Recent Labs    01/26/24 1414 01/27/24 0546  NA 124* 130*  K 3.8 3.7  CL 88* 91*  CO2 21* 27  GLUCOSE 131* 96  BUN 39* 19  CREATININE 7.05* 4.55*  CALCIUM 8.8* 8.9      Intake/Output Summary (Last 24 hours) at 01/28/2024 1018 Last data filed at 01/28/2024 0800 Gross per 24 hour  Intake 966 ml  Output --  Net 966 ml     Pressure Injury 01/12/24 Buttocks Left Deep Tissue Pressure Injury - Purple or maroon localized area of discolored intact skin or blood-filled blister due to damage of underlying soft tissue from pressure and/or shear. Purple DTI 2cm round (Active)  01/12/24 1830  Location: Buttocks  Location Orientation: Left  Staging: Deep Tissue Pressure Injury - Purple or maroon localized area of discolored intact skin or blood-filled blister due to damage of underlying soft tissue from pressure and/or shear.  Wound Description (Comments): Purple DTI 2cm round  Present on Admission: Yes    Physical Exam: Vital Signs Blood pressure 90/67, pulse 96, temperature 98 F (36.7 C), resp. rate 16, height 5\' 3"  (1.6 m), weight 70.7 kg, SpO2 99%. Gen: no distress, normal appearing.  Sitting up in w/c with OT.   HEENT: oral mucosa pink and moist, NCAT Cardio: Reg rate and rhythm, no m/r/g appreciated Chest: normal effort, normal rate of breathing, CTAB Abd: soft, normal bowel sounds, non-distended, nonTTP Ext: no edema, +asterixis Psych: pleasant, flat affect Skin: intact; right chest dialysis port  C/D/I Neuro: No focal neurologic deficits.  Awake, alert and oriented x 4.  Moving all 4 extremities antigravity and against resistance.  Touch intact.  Reflexes normal.  No tone, stable 2/28   Assessment/Plan: 1. Functional deficits which require 3+ hours per day of interdisciplinary therapy in a comprehensive inpatient rehab setting. Physiatrist is providing close team supervision and 24 hour management of active medical problems listed below. Physiatrist and rehab team continue to assess barriers to discharge/monitor patient progress toward functional and medical goals  Care Tool:  Bathing    Body parts bathed by patient: Right arm, Left arm, Chest, Abdomen, Front perineal area, Right upper leg, Left upper leg, Face, Right lower leg, Left lower leg, Buttocks   Body parts bathed by helper: Left lower leg, Right lower leg, Buttocks     Bathing assist Assist Level: Contact Guard/Touching assist     Upper Body Dressing/Undressing Upper body dressing   What is the patient wearing?: Pull over shirt    Upper body assist Assist Level: Set up assist    Lower Body Dressing/Undressing Lower body dressing      What is the patient wearing?: Pants  Lower body assist Assist for lower body dressing: Contact Guard/Touching assist     Toileting Toileting    Toileting assist Assist for toileting: Contact Guard/Touching assist     Transfers Chair/bed transfer  Transfers assist     Chair/bed transfer assist level: Supervision/Verbal cueing     Locomotion Ambulation   Ambulation assist      Assist level: Supervision/Verbal cueing Assistive device: Walker-rolling Max distance: 150ft   Walk 10 feet activity   Assist     Assist level: Supervision/Verbal cueing Assistive device: Walker-rolling   Walk 50 feet activity   Assist Walk 50 feet with 2 turns activity did not occur: Safety/medical concerns (fatigue)  Assist level: Supervision/Verbal cueing Assistive  device: Walker-rolling    Walk 150 feet activity   Assist Walk 150 feet activity did not occur: Safety/medical concerns (fatigue)  Assist level: Supervision/Verbal cueing Assistive device: Walker-rolling    Walk 10 feet on uneven surface  activity   Assist Walk 10 feet on uneven surfaces activity did not occur: Safety/medical concerns (fatigue)         Wheelchair     Assist Is the patient using a wheelchair?: Yes Type of Wheelchair: Manual    Wheelchair assist level: Dependent - Patient 0% Max wheelchair distance: >146ft    Wheelchair 50 feet with 2 turns activity    Assist        Assist Level: Dependent - Patient 0%   Wheelchair 150 feet activity     Assist      Assist Level: Dependent - Patient 0%   Blood pressure 90/67, pulse 96, temperature 98 F (36.7 C), resp. rate 16, height 5\' 3"  (1.6 m), weight 70.7 kg, SpO2 99%.  Medical Problem List and Plan: 1. Functional deficits secondary cardiab debility complicated by AKI             -patient may shower             -ELOS/Goals: 8-11 days S             Continue CIR  Messaged team to set d/c date-- 01/29/24  Placed order for central line removal  2.  Antithrombotics: -DVT/anticoagulation:  Pharmaceutical: continue Coumadin, INR reviewed and stable             -antiplatelet therapy:   3. Pain Management: denies pain. Tylenol prn mild pain, d/c oxycodone  4. Mood/Behavior/Sleep: Team to provide ego support. LCSW to follow for evaluation and support.              -d/c seroquel, continue low dose Remeron 7.5mg  at nights. Melatonin prn.              -antipsychotic agents: Seroquel daily at bedtime--d/c'd.  5. Neuropsych/cognition: This patient is capable of making decisions on her own behalf.  6. Skin/Wound Care: routine pressure relief measures. Monitor wound for healing  7. Overweight: provide dietary educaion  8. Afib/A flutter: Monitor HR TID and for symptoms with activity.  -continue  Amiodarone 200 mg daily with coumadin. -Heart rate well-controlled  9. Hypotension: TEDs. Midodrine 20 mg TID. KHT for support. Increase florinef to 0.1mg  daily  Vitals:   01/26/24 1600 01/26/24 1630 01/26/24 1700 01/26/24 1730  BP: (!) 92/56 98/68 97/62  (!) 90/59   01/26/24 1750 01/26/24 1759 01/26/24 2038 01/27/24 0534  BP: (!) 92/56 (!) 90/54 (!) 85/55 99/60   01/27/24 1333 01/27/24 1950 01/28/24 0447 01/28/24 0819  BP: (!) 85/57 98/68 (!) 89/61 90/67     10. Acute  on CKD: Now on HD MWF due to ischemic ATN/shock. Schedule HD at the end of the day to help with tolerance of therapy             --continue Phoslo for now--should improve with renal restrictions.   Continue fluid restriction  Will provide list of dietary guidelines while on dialysis    11. Acute on chronic anemia: continue Aranesp weekly    12. HFrEF: Strict I/O. Daily weights. Fluid status being managed with HD.   Reviewed weights and they are stable Filed Weights   01/26/24 1754 01/27/24 0534 01/28/24 0500  Weight: 69.6 kg 70.3 kg 70.7 kg    22. Depression: continue Zoloft 25mg  daily, Remeron 7.5mg  daily; seroquel d/c'd. Provide ego support.    23. Constipation: Increase colace to BID, miralax daily, d/c senna-docusate  24. Fatigue: improved, fludricortisone restarted due to symptoms concerning for orthostasis, continue  25. Type 2 diabetes: d/c feeding supplement  Improved, d/c CBGs  CBG (last 3)  Recent Labs    01/26/24 1223 01/26/24 2035 01/27/24 0605  GLUCAP 76 168* 122*     26.  Nausea/vomiting.  Resolved  27. Blurred vision: discussed could be 2/2 hypotension, resolved, fludricortisone restarted  LOS: 16 days A FACE TO FACE EVALUATION WAS PERFORMED  Kawika Bischoff P Irania Durell 01/28/2024, 10:18 AM

## 2024-01-28 NOTE — Progress Notes (Signed)
 Physical Therapy Discharge Summary  Patient Details  Name: Vanessa [name] MRN: [id number] Date of Birth: 15-Nov-1951  Date of Discharge from PT service:January 28, 2024  Today's Date: 01/28/2024 PT Individual Time: 0930-1040 PT Individual Time Calculation (min): 70 min   Patient has met 4 of 7 long term goals due to improved activity tolerance, improved balance, improved postural control, increased strength, increased range of motion, ability to compensate for deficits, and improved coordination. Patient to discharge at an ambulatory level Supervision using RW. Patient's care partner is independent to provide the necessary physical assistance at discharge. Plan for pt's sister to stay with her at discharge and provide 24/7 assist as needed.   Reasons goals not met: Pt did not meet sit<>stand goal of CGA as pt currently requires min A due to weakness/deconditioning. Pt also did not meet bed mobility goal of supervision or car transfer goal of min A due to weakness/deconditioning.   Recommendation:  Patient will benefit from ongoing skilled PT services in home health setting to continue to advance safe functional mobility, address ongoing impairments in transfers, generalized strengthening and endurance, dynamic standing balance/coordination, gait training, and to minimize fall risk.  Equipment: 18x16 manual WC with elevating legrests  Reasons for discharge: treatment goals met and discharge from hospital  Patient/family agrees with progress made and goals achieved: Yes  Today's Interventions: Received pt sitting in WC, pt agreeable to PT treatment, and denied any pain during session. Session with emphasis on discharge planning, bed mobility, functional mobility/transfers, generalized strengthening and endurance, dynamic standing balance/coordination, stair navigation, and ambulation. Went through sensation, MMT, and pain interference questionnaire in preparation for discharge. Adjusted  height of pt's new RW, then practiced bed mobility. Stood from [facility] with RW and mod A and transferred to/from bed with RW via stand<>pivot with supervision. Transferred sit<>supine with min A for BLE management. Attempted to use gait belt as leg lifter, but pt still required physical assist - reports her sister can assist at home. Transferred supine<>sitting L EOB from flat bed using bedrails with supervision, then stood from EOB with RW and heavy min A and returned to WC.   Pt transported to/from room in [facility] dependently due to sternal precautions. Stood from [facility] with RW and mod A and ambulated 57ft on uneven surfaces (ramp) with RW and close supervision. In main therapy gym, stood at staircase from [facility] with min A and navigated 8 3in steps with 2 handrails and CGA alternating ascending and descending with a step to and step through pattern. In dayroom, pt performed seated BLE strengthening on Kinetron at 20 cm/sec for 5 minutes with emphasis on glute/quad strength - pt reported 9/10 fatigue afterwards. Returned to room and concluded session with pt sitting in [facility] with all needs within reach.    PT Discharge Precautions/Restrictions Precautions Precautions: Fall;Sternal Precaution/Restrictions Comments: monitor BP Restrictions Weight Bearing Restrictions Per Provider Order: No Other Position/Activity Restrictions: sternal precautions Pain Interference Pain Interference Pain Effect on Sleep: 0. Does not apply - I have not had any pain or hurting in the past 5 days Pain Interference with Therapy Activities: 0. Does not apply - I have not received rehabilitationtherapy in the past 5 days Pain Interference with Day-to-Day Activities: 1. Rarely or not at all Cognition Overall Cognitive Status: Within Functional Limits for tasks assessed Arousal/Alertness: Awake/alert Orientation Level: Oriented X4 Memory: Appears intact Awareness: Appears intact Problem Solving: Appears intact Safety/Judgment: Appears  intact Sensation Sensation Light Touch: Appears Intact Hot/Cold: Appears Intact Proprioception: Appears Intact  Stereognosis: Appears Intact Coordination Gross Motor Movements are Fluid and Coordinated: Yes Fine Motor Movements are Fluid and Coordinated: Yes Coordination and Movement Description: global weakness/deconditioning Finger Nose Finger Test: WFL but slow Heel Shin Test: decreased ROM bilaterally - improved since eval Motor  Motor Motor: Within Functional Limits Motor - Skilled Clinical Observations: global weakness/deconditioning  Mobility Bed Mobility Bed Mobility: Rolling Right;Rolling Left;Supine to Sit;Sit to Supine Rolling Right: Supervision/verbal cueing Rolling Left: Supervision/Verbal cueing Supine to Sit: Supervision/Verbal cueing Sit to Supine: Minimal Assistance - Patient > 75% Transfers Transfers: Stand to Sit;Sit to Stand;Stand Pivot Transfers Sit to Stand: Minimal Assistance - Patient > 75% Stand to Sit: Supervision/Verbal cueing Stand Pivot Transfers: Supervision/Verbal cueing Transfer (Assistive device): Rolling walker Locomotion  Gait Ambulation: Yes Gait Assistance: Supervision/Verbal cueing Gait Distance (Feet): 186 Feet Assistive device: Rolling walker Gait Gait: Yes Gait Pattern: Impaired Gait Pattern: Step-through pattern;Decreased step length - right;Decreased step length - left;Decreased stride length;Poor foot clearance - right;Poor foot clearance - left Gait velocity: decreased Stairs / Additional Locomotion Stairs: Yes Stairs Assistance: Contact Guard/Touching assist Stair Management Technique: Two rails Number of Stairs: 8 Height of Stairs: 3 Ramp: Supervision/Verbal cueing (RW) Pick up small object from the floor assist level: Supervision/Verbal cueing Pick up small object from the floor assistive device: RW and Chief Operating Officer Mobility: Yes Wheelchair Assistance: Dependent - Patient 0% Wheelchair Parts  Management: Needs assistance Distance: >113ft  Trunk/Postural Assessment  Cervical Assessment Cervical Assessment: Within Functional Limits Thoracic Assessment Thoracic Assessment: Exceptions to [facility] (rounded shoulders) Lumbar Assessment Lumbar Assessment: Exceptions to [facility] (posterior pelvic tilt) Postural Control Postural Control: Within Functional Limits  Balance Balance Balance Assessed: Yes Static Sitting Balance Static Sitting - Balance Support: Feet supported;Bilateral upper extremity supported Static Sitting - Level of Assistance: 7: Independent Dynamic Sitting Balance Dynamic Sitting - Balance Support: Feet supported;No upper extremity supported Dynamic Sitting - Level of Assistance: 6: Modified independent (Device/Increase time) Static Standing Balance Static Standing - Balance Support: Bilateral upper extremity supported;During functional activity (RW) Static Standing - Level of Assistance: 5: Stand by assistance (supervision) Dynamic Standing Balance Dynamic Standing - Balance Support: Bilateral upper extremity supported;During functional activity (RW) Dynamic Standing - Level of Assistance: 5: Stand by assistance (supervision) Dynamic Standing - Comments: with transfers and gait Extremity Assessment  RLE Assessment RLE Assessment: Exceptions to [facility] General Strength Comments: tested sitting in WC RLE Strength Right Hip Flexion: 3+/5 Right Hip ABduction: 4-/5 Right Hip ADduction: 4-/5 Right Knee Flexion: 4-/5 Right Knee Extension: 4-/5 Right Ankle Dorsiflexion: 4/5 Right Ankle Plantar Flexion: 4/5 LLE Assessment LLE Assessment: Exceptions to [facility] General Strength Comments: tested sitting in WC LLE Strength Left Hip Flexion: 3+/5 Left Hip ABduction: 4-/5 Left Hip ADduction: 4-/5 Left Knee Flexion: 4-/5 Left Knee Extension: 4-/5 Left Ankle Dorsiflexion: 4/5 Left Ankle Plantar Flexion: 4/5   Averly Ericson M Zaunegger Blima Rich PT, DPT 01/28/2024, 7:19 AM

## 2024-01-28 NOTE — Progress Notes (Signed)
 Contacted [facility] to confirm that pt will d/c tomorrow and start on Monday as planned. Contacted renal NP with request to send orders to clinic. Arrangements added to AVS as well.   Olivia Canter Renal Navigator [phone]

## 2024-01-28 NOTE — Progress Notes (Signed)
 Discussed Fosrenol with Dr. Malen Gauze as need prior auth. Phos level well controlled--she recommended d/c  and dialysis unit will follow up for resumption.

## 2024-01-28 NOTE — Procedures (Signed)
 I was present at this dialysis session. I have reviewed the session itself and made appropriate changes.   Filed Weights   01/27/24 0534 01/28/24 0500 01/28/24 1310  Weight: 70.3 kg 70.7 kg 70.3 kg    Recent Labs  Lab 01/28/24 1324  NA 127*  K 3.7  CL 88*  CO2 25  GLUCOSE 100*  BUN 35*  CREATININE 6.77*  CALCIUM 9.0  PHOS 2.9    Recent Labs  Lab 01/26/24 1414 01/27/24 0546 01/28/24 1319  WBC 9.4 7.4 8.2  HGB 10.8* 10.4* 10.7*  HCT 33.7* 32.5* 32.6*  MCV 93.6 95.0 93.7  PLT 102* 86* 109*    Scheduled Meds:  amiodarone  200 mg Oral Daily   ascorbic acid  250 mg Oral BID   Chlorhexidine Gluconate Cloth  6 each Topical Q12H   cholecalciferol  1,000 Units Oral Daily   docusate sodium  100 mg Oral BID   fludrocortisone  0.1 mg Oral Daily   Gerhardt's butt cream   Topical BID   lanthanum  500 mg Oral TID WC   midodrine  20 mg Oral TID WC   mirtazapine  7.5 mg Oral QHS   multivitamin  1 tablet Oral BID   polyethylene glycol  17 g Oral Daily   sertraline  25 mg Oral Daily   sodium chloride flush  10-40 mL Intracatheter Q12H   warfarin  5 mg Oral ONCE-1600   Warfarin - Pharmacist Dosing Inpatient   Does not apply q1600   Continuous Infusions:  anticoagulant sodium citrate     PRN Meds:.acetaminophen, anticoagulant sodium citrate, artificial tears, diphenhydrAMINE, guaiFENesin-dextromethorphan, levalbuterol, lidocaine (PF), lidocaine, lidocaine-prilocaine, lip balm, melatonin, ondansetron, mouth rinse, pentafluoroprop-tetrafluoroeth, polyethylene glycol, prochlorperazine **OR** [DISCONTINUED] prochlorperazine **OR** [DISCONTINUED] prochlorperazine, simethicone, sodium chloride, sodium chloride flush, sorbitol   Anthony Sar, MD [facility] 01/28/2024, 2:48 PM

## 2024-01-28 NOTE — Progress Notes (Signed)
 Occupational Therapy Session Note  Patient Details  Name: Vanessa [name] MRN: [id number] Date of Birth: 19-Mar-1951  Today's Date: 01/28/2024 OT Individual Time: 0800-0900 & 1104-1200 OT Individual Time Calculation (min): 60 min & 56 min   Short Term Goals: Week 2:  OT Short Term Goal 1 (Week 2): STG = LTG due to ELOS  Skilled Therapeutic Interventions/Progress Updates:  Session 1 Skilled OT intervention completed with focus on ADL retraining, functional transfers, IADL management. Pt received seated in w/c already washing up for the day. Pt agreeable to session. No pain reported.  Pt set up A for UB bathing, and LB bathing excluding feet. Able to donn shirt independently, thread BLE into pants with supervision but required min A to stand with RW for pt to donn over hips with CGA provided for standing balance. Nurse present for morning meds. Nurse assessed BP as 90/67; pt asymptomatic.   Transported dependently in w/c <> ADL apartment. In kitchen, pt demonstrated ability to reach into various cabinet heights, gather items on affected UE, open/close fridge door, all at the close supervision level with use of RW. Cues were needed for back stepping to prevent LOB and for use of RW bag already installed to carry items vs holding in one hand and the other on RW. Discussed renal diet items and asked MD for list of items to avoid as pt reports she eats "[place] butts" every morning at baseline.  OT demonstrated transfer into tub/shower with TTB, incase she comes off HD or gets clearance to shower from waist down at home. Advised use of hand held shower head for ease of bathing. Demonstrated method of tucking shower curtain under buttocks to prevent water spillage in floor. Discussed using lateral leans for peri-washing and use/installation of grab bars for balance.   Pt remained seated in w/c, with chair alarm on/activated, and with all needs in reach at end of session.  Session 2 Skilled OT  intervention completed with focus on cardiovascular/BUE endurance, nausea management, DC planning. Pt received seated in w/c, agreeable to session. No pain reported, however did express fatigue.  Pt declined self-care needs. Transported dependently in w/c <> gym. Pt completed the following intervals while seated at the SCIFIT to promote global/BUE endurance needed for independence with BADLs and functional transfers: -3 min, level 1, forwards -3 min, level 1, forwards -3 min, level 1, backwards -3 min, level 1, backwards Intermittent rest breaks needed for fatigue  Pt had episode of emesis. Brought pt back to room. Nurse notified of pt status and in room to administer meds for nausea management.   OT assisted pt with calling [employer] to ensure DME copay was paid in order for DME to be delivered in prep for DC tomorrow, however pt without current card on her at hospital. OT suggested she try to call her sister in order to see if she can send pt card info from her home, however discussed that pt could use hospital w/c to transport to the car, then use RW to walk up ramp, which she already has but advised her to call today as she will need [facility] for toileting as time sensitive need.  Nurse notified that HD planned to pick pt up shortly therefore returned pt to bed. Heavy mod A sit > stand with RW, CGA stand pivot with RW > EOB. Mod A for sit > supine bed mobility. Pt remained semi supine in bed, with bed alarm on/activated, and with all needs in reach at end of  session.   Therapy Documentation Precautions:  Precautions Precautions: Fall, Sternal Precaution/Restrictions Comments: monitor BP Restrictions Weight Bearing Restrictions Per Provider Order: No Other Position/Activity Restrictions: sternal precautions    Therapy/Group: Individual Therapy  [name], MS, OTR/L  01/28/2024, 12:06 PM

## 2024-01-28 NOTE — Progress Notes (Signed)
 Received patient in bed to unit.  Alert and oriented.  Informed consent signed and in chart.   TX duration: 3.25  Patient tolerated well.  Transported back to the room  Alert, without acute distress.  Hand-off given to patient's nurse.   Access used: Right chest [facility]  Access issues: None  Total UF removed: 1.4 Medication(s) given: See MAR BP 97/74

## 2024-01-28 NOTE — Discharge Planning (Signed)
 [facility]  Initial Hemodialysis Orders  Dialysis center: [facility]  Patient's name: Vanessa [name] DOB: Nov 06, 1951 AKI or ESRD: AKI  Discharge diagnosis: AoCKD 3 ischemic ATN in setting of shock 2.   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   HFrEF 4.   Shock  Allergies:  Allergies  Allergen Reactions   Spironolactone     hyperkalemia    Date of First Dialysis: 12/31/2023 Cause of renal disease: Cardiorenal  Dialysis Prescription: Dialysis Frequency: MWF Tx duration: 3.5 hr BFR: 350 DFR: autoflow 1.5 EDW: 68.6 kg  Dialyzer: 180NRe UF profile/Sodium modeling?: NA Dialysis Bath: 3.0 K 2.5 Ca  Dialysis access: Access type: [facility] Date placed: 12/24/2023 Surgeon: Dr. Juel Burrow Needle gauge: NA  In Center Medications: Heparin Dose: None  Type: NA VDRA: Calcitriol per protocol Venofer: Per protocol Mircera: Per protocol  Sensipar: NA  Discharge labs: Hgb: 10.7 K+: 3.7  Ca: 9.0 Phos: 2.9 Alb: 3.2  Please draw routine labs. Additional labs needed: Weekly BMET q Monday  Alonna Buckler [facility] [phone]

## 2024-01-28 NOTE — Progress Notes (Signed)
 Inpatient Rehabilitation Care Coordinator Discharge Note DC SAT 3/1  Patient Details  Name: Vanessa [name] MRN: [id number] Date of Birth: 08/04/1951   Discharge location: HOME WITH SISTER WHO HAS COME FROM OUT OF TOWN TO ASSIST WITH THE TRANSITION HOME  Length of Stay: 17 DAYS  Discharge activity level: SUPERVISION-MOD/I LEVEL  Home/community participation: ACTIVE  Patient response WU:JWJXBJ Literacy - How often do you need to have someone help you when you read instructions, pamphlets, or other written material from your doctor or pharmacy?: Never  Patient response YN:WGNFAO Isolation - How often do you feel lonely or isolated from those around you?: Rarely  Services provided included: MD, RD, PT, OT, RN, CM, TR, Pharmacy, Neuropsych, SW  Financial Services:  Field seismologist Utilized: [employer]  Choices offered to/list presented to: PT  Follow-up services arranged:  Home Health, DME, Patient/Family has no preference for HH/DME agencies Home Health Agency: [facility]  PT & OT    DME : [employer] [facility] AND 3 IN 1 HAS ROLLING WALKER  IF SHE DOES NOT PAY THE CO-PAY SHE WILL NOT RECEIVE HER EQUIPMENT. PT AWARE OF THIS. HAVE MADE REFERRAL TO [facility] AND APPROVED. HAVE SET UP MONDAY TRANSPORT TO OP-HD WILL PICK UP AT 10:29 AND PICK BACK UP AT 3;30. PT WILL ARRANGE OTHER HD APPOINTMENTS FROM HERE ON  Patient response to transportation need: Is the patient able to respond to transportation needs?: Yes In the past 12 months, has lack of transportation kept you from medical appointments or from getting medications?: No In the past 12 months, has lack of transportation kept you from meetings, work, or from getting things needed for daily living?: No   Patient/Family verbalized understanding of follow-up arrangements:  Yes  Individual responsible for coordination of the follow-up plan: SELF AND Vanessa [name]-FRIEND [phone]  Confirmed  correct DME delivered: Vanessa [name] 01/28/2024    Comments (or additional information):PT DID WELL AND MADE GOOD PROGRESS WHILE HERE.   Summary of Stay    Date/Time Discharge Planning CSW  01/26/24 [phone] Vanessa [name] is sister is coming to assist. Have arrnged transport to OP-HD for Monday and gave pt information on how to set up further transport. Have ordered DME and HH set up for DC RGD  01/19/24 0814 Home sister coming to assist with her care. Have completed [facility] transport form and faxed due to sister does not drive. Pt making good progress and awaiting team's recommendations RGD       Vanessa [name], Vanessa [name]

## 2024-01-28 NOTE — Progress Notes (Signed)
 Goal reduced to 1.5 for BP trending down

## 2024-01-28 NOTE — Progress Notes (Signed)
 PHARMACY - ANTICOAGULATION CONSULT NOTE  Pharmacy Consult for Warfarin Indication: atrial fibrillation  Allergies  Allergen Reactions   Spironolactone     hyperkalemia    Patient Measurements: Height: 5\' 3"  (160 cm) Weight: 70.7 kg (155 lb 13.8 oz) IBW/kg (Calculated) : 52.4  Vital Signs: Temp: 98 F (36.7 C) (02/28 0447) BP: 90/67 (02/28 0819) Pulse Rate: 96 (02/28 0819)  Labs: Recent Labs    01/26/24 0528 01/26/24 1414 01/27/24 0546 01/28/24 0950  HGB  --  10.8* 10.4*  --   HCT  --  33.7* 32.5*  --   PLT  --  102* 86*  --   LABPROT 21.6*  --  21.3* 20.3*  INR 1.9*  --  1.8* 1.7*  CREATININE  --  7.05* 4.55*  --     Estimated Creatinine Clearance: 10.5 mL/min (A) (by C-G formula based on SCr of 4.55 mg/dL (H)).  Assessment: 73 yo female s/p scheduled bioprosthetic mitral valve replacement with tissue valve 12/30 now with atrial fibrillation. Pharmacy asked to dose warfarin with IV heparin while inpatient. Spontaneously converted 1/07; flipped back to a-fib overnight 1/09. Baseline INR 1.1, slow to uptrend then held 1/21-1/24 for tunneled access plans. Patient now s/p [facility] and tunneled central line placement 1/24 for new HD MWF. Heparin bridge stopped 1/31 with INR >2. Continues on Amiodarone, which can increase sensitivity to warfarin, but on amiodarone prior to warfarin initiation.   INR had been therapeutic on 3 mg daily possibly due to poor diet (~25% meals reported consumed around this time). Patient diet appears much improved with 80-100% meals consumed now. INR remains slightly subtherapeutic 1.7. Platelet count has decreased from 150 to 86. No bleeding reported.   Goal of Therapy:  INR 2-3 (bMVR + atrial fibrillation) Monitor platelets by anticoagulation protocol: Yes   Plan:  Give warfarin 5 mg PO x1 dose Check INR daily while on warfarin Continue to monitor H&H and platelets  For discharge planning, can consider continuing Warfarin 5mg  PO daily until  Anticoagulation Clinic Appt (set for 02/01/24 per PA Pam)   Thank you for allowing pharmacy to be a part of this patient's care.  Thelma Barge, PharmD, BCPS Clinical Pharmacist

## 2024-01-28 NOTE — Discharge Summary (Signed)
 Physician Discharge Summary  Patient ID: Vanessa [name] MRN: [id number] DOB/AGE: [birth year] 73 y.o.  Admit date: 01/12/2024 Discharge date: 01/29/2024  Discharge Diagnoses:  Principal Problem:   Debility Active Problems:   Chronic systolic CHF (congestive heart failure) (HCC)   S/P mitral valve replacement   Physical deconditioning   Acute renal failure superimposed on stage 3 chronic kidney disease (HCC)   Reactive depression   Anemia   Idiopathic hypotension   Discharged Condition: stable  Significant Diagnostic Studies: IR Removal Tun Cv Cath W/O FL Result Date: 01/20/2024 INDICATION: 73 year old female with chronic kidney disease with tunneled CV catheter for removal today. EXAM: REMOVAL TUNNELED CENTRAL VENOUS CATHETER MEDICATIONS: None ANESTHESIA/SEDATION: None FLUOROSCOPY: None used COMPLICATIONS: None immediate. PROCEDURE: Informed written consent was obtained from the patient after a thorough discussion of the procedural risks, benefits and alternatives. All questions were addressed. Maximal Sterile Barrier Technique was utilized including caps, mask, sterile gowns, sterile gloves, sterile drape, hand hygiene and skin antiseptic. A timeout was performed prior to the initiation of the procedure. The patient's left chest and catheter was prepped and draped in a normal sterile fashion. Heparin was removed from both ports of catheter. 1% lidocaine was used for local anesthesia. Using gentle blunt dissection the cuff of the catheter was exposed and the catheter was removed in it's entirety. Pressure was held till hemostasis was obtained. A sterile dressing was applied. The patient tolerated the procedure well with no immediate complications. IMPRESSION: Successful catheter removal as described above. Performed By Theresa Mulligan, PA-C Electronically Signed   By: Marliss Coots M.D.   On: 01/20/2024 14:48   DG Chest 1 View Result Date: 01/16/2024 CLINICAL DATA:  Evaluate for central line  complication EXAM: PORTABLE CHEST 1 VIEW COMPARISON:  12/24/2023 FINDINGS: Dialysis catheter is again identified and stable. Left tunneled PICC is seen with the catheter tip at the cavoatrial junction. Postsurgical changes are noted. Elevation of left hemidiaphragm is seen with left basilar atelectasis. Right lung is clear. IMPRESSION: Left basilar atelectasis. Tubes and lines as described above stable from the prior exam. Electronically Signed   By: Alcide Clever M.D.   On: 01/16/2024 01:10    Labs:  Basic Metabolic Panel: Recent Labs  Lab 01/26/24 1414 01/27/24 0546 01/28/24 1324  NA 124* 130* 127*  K 3.8 3.7 3.7  CL 88* 91* 88*  CO2 21* 27 25  GLUCOSE 131* 96 100*  BUN 39* 19 35*  CREATININE 7.05* 4.55* 6.77*  CALCIUM 8.8* 8.9 9.0  PHOS 2.7  --  2.9    CBC: Recent Labs  Lab 01/26/24 1414 01/27/24 0546 01/28/24 1319  WBC 9.4 7.4 8.2  HGB 10.8* 10.4* 10.7*  HCT 33.7* 32.5* 32.6*  MCV 93.6 95.0 93.7  PLT 102* 86* 109*    CBG: Recent Labs  Lab 01/25/24 2042 01/26/24 0614 01/26/24 1223 01/26/24 2035 01/27/24 0605  GLUCAP 148* 106* 76 168* 122*    Brief HPI:   [name] is a 73 y.o. female with history of CKD, NICM with EF 40 to 45%, severe MR s/p MitraClip x 2 who started developing lower extremity edema with DOE due to severe MR.  She was admitted on 11/19/2023 for mitral valve replacement with Mosaic porcine tissue valve and tricuspid annuloplasty ring by Dr. Leafy Ro.  Postop course complicated by hypovolemic cardiogenic shock due to cardiac tamponade and she was taken back to the OR that evening for reexploration with evacuation of large amount of clot in pericardial  and pleural spaces.  She continued to ooze requiring 3 units PRBCs, 4 units FFP, 1 unit platelets and cryoprecipitate.  Postop course significant for NSVT as well as respiratory distress due to fluid overload requiring reintubation.  CRRT initiated for ATN due to cardiorenal syndrome in setting of  shock.    She continued to have issues with persistent A-fib requiring IV heparin and was transitioned to Coumadin.  Midodrine added for BP support due to ongoing issues with low blood pressure.  Leukocytosis treated with broad-spectrum antibiotics and no source of infection was found.  2D echo was repeated on 01/14 showing EF of 35 to 40% with trivial MR and no evidence of pericardial effusion.  CRRT briefly discontinued however she remained anuric therefore tunneled catheter placed and she was transition to hemodialysis.  Blood pressures were stabilizing however patient was noted to be deconditioned and limited by dizziness, sternal precautions and was requiring mod assist overall.  She was independent and working prior to admission.  CIR was recommended due to functional decline.     Hospital Course: [name] was admitted to rehab 01/12/2024 for inpatient therapies to consist of PT and OT at least three hours five days a week. Past admission physiatrist, therapy team and rehab RN have worked together to provide customized collaborative inpatient rehab. Blood pressures were monitored on TID basis and hypotension continued to be an issue.  Florinef was added and titrated up to 0.1 mg daily.  Hemodialysis has been ongoing at the end of the day to help with tolerance of therapy.  She continues to be anuric at this time.  Weight at discharge is 155.6 pounds and she has been set up for hemodialysis MWF at  [facility] after discharge.  Phosphorus levels are well controlled on Fosrenol was DC'd at discharge per discussion with Dr. Malen Gauze.    Pharmacy has been assisting with dosing of Coumadin and dose was adjusted to 5 mg daily prior to discharge to help keep INR within therapeutic range.  Patient set up with Coumadin clinic at [facility] 03/04 for protime draw. Midline sternal incision is clean, dry and intact and is healing well without any signs or symptoms of infection.  No cardiac symptoms reported with increase  in activity.  Heart rate has been controlled on current dose amiodarone.  Sleep-wake disruption has resolved and Seroquel was discontinued.  She continues on low-dose Remeron as well as Zoloft for mood stabilization.  Central line left chest wall was removed without difficulty on 2/20 by IR.  Anemia of chronic disease has been managed with weekly Aranesp.  Bowel program has been titrated with resolution of constipation.  She has made good gains during her rehab stay and supervision is recommended for safety. She will continue to receive home health PT and OT by [facility] after discharge.   Rehab course: During patient's stay in rehab weekly team conferences were held to monitor patient's progress, set goals and discuss barriers to discharge. At admission, patient required mod assist with mobility and max assist with ADL tasks. She  has had improvement in activity tolerance, balance, postural control as well as ability to compensate for deficits.  She is able to complete ADL tasks with supervision.  She requires min assist to supervision for transfers and is able to ambulate 186 feet with use of RW, supervision and verbal cues for safety.  Family location has been completed.   Discharge disposition: 06-Home-Health Care Svc  Diet: Renal diet 1500 cc FR/day  Special Instructions: Appointment with coumadin clinic for march 4th at 10:20 am 2.  Continue sternal precautions till cleared by surgeon.   Allergies as of 01/29/2024       Reactions   Spironolactone    hyperkalemia        Medication List     STOP taking these medications    aspirin EC 81 MG tablet   CALCIUM + VITAMIN D3 PO   carvedilol 6.25 MG tablet Commonly known as: COREG   feeding supplement Liqd   Gerhardt's butt cream Crea   ibandronate 150 MG tablet Commonly known as: BONIVA   Jardiance 10 MG Tabs tablet Generic drug: empagliflozin   lanthanum 500 MG chewable tablet Commonly known as: FOSRENOL    levalbuterol 0.63 MG/3ML nebulizer solution Commonly known as: XOPENEX   metoCLOPramide 5 MG tablet Commonly known as: REGLAN   oxyCODONE 5 MG immediate release tablet Commonly known as: Oxy IR/ROXICODONE   polyethylene glycol 17 g packet Commonly known as: MIRALAX / GLYCOLAX Replaced by: polyethylene glycol powder 17 GM/SCOOP powder   QUEtiapine 25 MG tablet Commonly known as: SEROQUEL   torsemide 20 MG tablet Commonly known as: DEMADEX   traZODone 100 MG tablet Commonly known as: DESYREL   Vericiguat 2.5 MG Tabs       TAKE these medications    acetaminophen 325 MG tablet Commonly known as: TYLENOL Take 1-2 tablets (325-650 mg total) by mouth every 4 (four) hours as needed for mild pain (pain score 1-3).   amiodarone 200 MG tablet Commonly known as: PACERONE Take 1 tablet (200 mg total) by mouth daily.   ascorbic acid 500 MG tablet Commonly known as: VITAMIN C Take 0.5 tablets (250 mg total) by mouth 2 (two) times daily. What changed: medication strength   docusate sodium 100 MG capsule Commonly known as: COLACE Take 2 capsules (200 mg total) by mouth 2 (two) times daily.   fludrocortisone 0.1 MG tablet Commonly known as: FLORINEF Take 1 tablet (0.1 mg total) by mouth daily.   lidocaine-prilocaine cream Commonly known as: EMLA Apply 1 Application topically as needed (topical anesthesia for hemodialysis if Gebauers and Lidocaine injection are ineffective.).   midodrine 10 MG tablet Commonly known as: PROAMATINE Take 2 tablets (20 mg total) by mouth 3 (three) times daily with meals.   mirtazapine 7.5 MG tablet Commonly known as: REMERON Take 1 tablet (7.5 mg total) by mouth at bedtime.   multivitamin Tabs tablet Take 1 tablet by mouth 2 (two) times daily.   polyethylene glycol powder 17 GM/SCOOP powder Commonly known as: GLYCOLAX/MIRALAX Take 17 g by mouth daily as needed. Replaces: polyethylene glycol 17 g packet   Senna-S 8.6-50 MG  tablet Generic drug: senna-docusate Take 1 tablet by mouth at bedtime. What changed:  how much to take when to take this Notes to patient: laxative   sertraline 25 MG tablet Commonly known as: ZOLOFT Take 1 tablet (25 mg total) by mouth daily.   sodium chloride 0.65 % Soln nasal spray Commonly known as: OCEAN Place 1 spray into both nostrils as needed for congestion.   warfarin 5 MG tablet Commonly known as: COUMADIN Take as directed. If you are unsure how to take this medication, talk to your nurse or doctor. Original instructions: Take 1 tablet (5 mg total) by mouth daily at 4 PM. What changed:  medication strength how much to take        Follow-up Information     Redmon, [place], PA. Call on 01/31/2024.  Specialty: Physician Assistant Why: for post hospital follow up appt Contact information: [address] [place] [address] [phone]         Laurey Morale, MD Follow up.   Specialty: Cardiology Contact information: [address] [place] [address] [phone]         Horton Chin, MD Follow up.   Specialty: Physical Medicine and Rehabilitation Why: office will call you with follow up appointment Contact information: [address] [place] [address] [phone]         Eugenio Hoes, MD. Call.   Specialty: Cardiothoracic Surgery Why: Monday for post op appointment Contact information: [address] [place] [address] [phone]         Dagoberto Ligas, MD. Call.   Specialties: Nephrology, Vascular Surgery Why: monday for follow up appt Contact information: [address] [place] [address] [phone]         [facility]. Go on 01/31/2024.   Why: Schedule is Monday, Wednesday, Friday with 12:10 chair time.  On Monday (3/3), please arrive at 11:15 am to complete paperwork prior to treatment. Contact information: [address] [place]  [place] [address] [phone]                 Signed: Jacquelynn Cree 02/01/2024, 2:57 PM

## 2024-01-28 NOTE — Progress Notes (Addendum)
 Patient ID: Vanessa [name], female   DOB: 05-26-1951, 73 y.o.   MRN: [id number]  met with pt to discuss again the co-pay for her wheelchair and 3 in 1 and if not made will not receive her equipment by discharge. She has the rolling walker to take home. Gave her the number to [employer] again. Hopefully she will call today  2:01 PM Pt has paid her co-pay will get DME hopefully today in anticipation for discharge tomorrow

## 2024-01-28 NOTE — Telephone Encounter (Signed)
 Pharmacy Patient Advocate Encounter  Received notification from [facility] [employer] that Prior Authorization for Lidocaine-Prilocaine 2.5-2.5% cream has been APPROVED from 01/28/2024 to 04/27/2024   PA #/Case ID/Reference #: [id number] Key: [id number]

## 2024-01-29 ENCOUNTER — Other Ambulatory Visit (HOSPITAL_COMMUNITY): Payer: Self-pay

## 2024-01-29 DIAGNOSIS — T782XXS Anaphylactic shock, unspecified, sequela: Secondary | ICD-10-CM | POA: Insufficient documentation

## 2024-01-29 LAB — PROTIME-INR
INR: 1.8 — ABNORMAL HIGH (ref 0.8–1.2)
Prothrombin Time: 21.3 s — ABNORMAL HIGH (ref 11.4–15.2)

## 2024-01-29 NOTE — Progress Notes (Signed)
 Inpatient Rehabilitation Discharge Medication Review by a Pharmacist  A complete drug regimen review was completed for this patient to identify any potential clinically significant medication issues.  High Risk Drug Classes Is patient taking? Indication by Medication  Antipsychotic No,    Anticoagulant Yes Warfarin - Atrial fibrillation, bMVR  Antibiotic No   Opioid Yes Oxycodone - pain   Antiplatelet No   Hypoglycemics/insulin No   Vasoactive Medication Yes Amiodarone-  atrial fibrillation, flutter  Chemotherapy No   Other Yes Florinef, midodrine- hypotension Melatonin- sleep Renal Multivitamin, Vitamin C, vitamin D - supplements Mirazapine, Sertraline - depression Docusate - laxative  PRNs: Acetaminophen - mild pain Senna-docusate, miralax - constipation Emla cream - topical pain relief w/ HD access  Saline nasal spray - congestion     Type of Medication Issue Identified Description of Issue Recommendation(s)  Drug Interaction(s) (clinically significant)     Duplicate Therapy     Allergy     No Medication Administration End Date     Incorrect Dose     Additional Drug Therapy Needed     Significant med changes from prior encounter (inform family/care partners about these prior to discharge). PTA meds discontinued: aspirin, carvedilol, calcium + vitamin D, jardiance, ibandronate, torsemide, vericiguat.  Meds from inpatient admit now discontinued:  fosrenol, quetiapine, prn metoclopramide, trazodone, oxycodone.  New: amiodarone, warfarin, vitamin C, vitamin D, renal vitamin, florinef, midodrine, mirtazapine, sertraline. Communicate changes with patient/family prior to discharge.   Other       Clinically significant medication issues were identified that warrant physician communication and completion of prescribed/recommended actions by midnight of the next day:  No  Pharmacist comments:  - agree with Warfarin 5 mg daily dose for now.  For outpatient PT/INR on  02/01/24. - noted Fosrenol requiring prior authorization; discontinued on 2/28, phos normal 2.9.  Renal to follow up phos and and need for medications as outpatient.  Time spent performing this drug regimen review (minutes):  [address]  Dennie Fetters, [place] 01/29/2024 10:11 AM

## 2024-01-29 NOTE — Progress Notes (Signed)
 PROGRESS NOTE   Subjective/Complaints:  Pt doing well, ready to go home. Slept ok, pain well controlled, LBM yesterday, urinating a little which is normal for her. No other complaints or concerns. All questions answered regarding d/c.   ROS: Denies fevers, chills,   abdominal pain, SOB, chest pain, new weakness or paraesthesias.      Objective:   No results found.   Recent Labs    01/27/24 0546 01/28/24 1319  WBC 7.4 8.2  HGB 10.4* 10.7*  HCT 32.5* 32.6*  PLT 86* 109*     Recent Labs    01/27/24 0546 01/28/24 1324  NA 130* 127*  K 3.7 3.7  CL 91* 88*  CO2 27 25  GLUCOSE 96 100*  BUN 19 35*  CREATININE 4.55* 6.77*  CALCIUM 8.9 9.0      Intake/Output Summary (Last 24 hours) at 01/29/2024 1026 Last data filed at 01/29/2024 0800 Gross per 24 hour  Intake 488 ml  Output 1.4 ml  Net 486.6 ml         Physical Exam: Vital Signs Blood pressure (!) 87/65, pulse 81, temperature 98.1 F (36.7 C), resp. rate 15, height 5\' 3"  (1.6 m), weight 70.6 kg, SpO2 92%. Gen: no distress, normal appearing.  Sitting up in w/c dressed and ready to go.   HEENT: oral mucosa pink and moist, NCAT Cardio: Reg rate and rhythm, no m/r/g appreciated Chest: normal effort, normal rate of breathing, CTAB Abd: soft, normal bowel sounds, non-distended, nonTTP Ext: no edema, +asterixis Psych: pleasant, flat affect  PRIOR EXAMS: Skin: intact; right chest dialysis port C/D/I Neuro: No focal neurologic deficits.  Awake, alert and oriented x 4.  Moving all 4 extremities antigravity and against resistance.  Touch intact.  Reflexes normal.  No tone, stable 2/28   Assessment/Plan: 1. Functional deficits which require 3+ hours per day of interdisciplinary therapy in a comprehensive inpatient rehab setting. Physiatrist is providing close team supervision and 24 hour management of active medical problems listed below. Physiatrist and rehab  team continue to assess barriers to discharge/monitor patient progress toward functional and medical goals  Care Tool:  Bathing    Body parts bathed by patient: Right arm, Left arm, Chest, Abdomen, Front perineal area, Right upper leg, Left upper leg, Face, Right lower leg, Left lower leg, Buttocks   Body parts bathed by helper: Left lower leg, Right lower leg, Buttocks     Bathing assist Assist Level: Contact Guard/Touching assist     Upper Body Dressing/Undressing Upper body dressing   What is the patient wearing?: Pull over shirt    Upper body assist Assist Level: Set up assist    Lower Body Dressing/Undressing Lower body dressing      What is the patient wearing?: Pants     Lower body assist Assist for lower body dressing: Contact Guard/Touching assist     Toileting Toileting    Toileting assist Assist for toileting: Contact Guard/Touching assist     Transfers Chair/bed transfer  Transfers assist     Chair/bed transfer assist level: Supervision/Verbal cueing     Locomotion Ambulation   Ambulation assist      Assist level: Supervision/Verbal cueing Assistive  device: Walker-rolling Max distance: 183ft   Walk 10 feet activity   Assist     Assist level: Supervision/Verbal cueing Assistive device: Walker-rolling   Walk 50 feet activity   Assist Walk 50 feet with 2 turns activity did not occur: Safety/medical concerns (fatigue)  Assist level: Supervision/Verbal cueing Assistive device: Walker-rolling    Walk 150 feet activity   Assist Walk 150 feet activity did not occur: Safety/medical concerns (fatigue)  Assist level: Supervision/Verbal cueing Assistive device: Walker-rolling    Walk 10 feet on uneven surface  activity   Assist Walk 10 feet on uneven surfaces activity did not occur: Safety/medical concerns (fatigue)   Assist level: Supervision/Verbal cueing Assistive device: Walker-rolling   Wheelchair     Assist Is  the patient using a wheelchair?: Yes Type of Wheelchair: Manual    Wheelchair assist level: Dependent - Patient 0% Max wheelchair distance: >124ft    Wheelchair 50 feet with 2 turns activity    Assist        Assist Level: Dependent - Patient 0%   Wheelchair 150 feet activity     Assist      Assist Level: Dependent - Patient 0%   Blood pressure (!) 87/65, pulse 81, temperature 98.1 F (36.7 C), resp. rate 15, height 5\' 3"  (1.6 m), weight 70.6 kg, SpO2 92%.  Medical Problem List and Plan: 1. Functional deficits secondary cardiab debility complicated by AKI             -patient may shower             -ELOS/Goals: 8-11 days S             Continue CIR  Messaged team to set d/c date-- 01/29/24  Placed order for central line removal  -01/29/24 d/c home today, meds reviewed  2.  Antithrombotics: -DVT/anticoagulation:  Pharmaceutical: continue Coumadin, INR reviewed and stable             -antiplatelet therapy:   3. Pain Management: denies pain. Tylenol prn mild pain, d/c oxycodone  4. Mood/Behavior/Sleep: Team to provide ego support. LCSW to follow for evaluation and support.              -d/c seroquel, continue low dose Remeron 7.5mg  at nights. Melatonin prn.              -antipsychotic agents: Seroquel daily at bedtime--d/c'd.  5. Neuropsych/cognition: This patient is capable of making decisions on her own behalf.  6. Skin/Wound Care: routine pressure relief measures. Monitor wound for healing  7. Overweight: provide dietary educaion  8. Afib/A flutter: Monitor HR TID and for symptoms with activity.  -continue Amiodarone 200 mg daily with coumadin. -Heart rate well-controlled  9. Hypotension: TEDs. Midodrine 20 mg TID. KHT for support. Increase florinef to 0.1mg  daily  -01/29/24 BPs stable, discussed importance of midodrine at home TID  Vitals:   01/28/24 1330 01/28/24 1400 01/28/24 1430 01/28/24 1500  BP: 94/72 108/68 93/66 91/73    01/28/24 1505 01/28/24 1530  01/28/24 1600 01/28/24 1630  BP: 91/73 93/66 99/70  95/67   01/28/24 1645 01/28/24 1714 01/28/24 2012 01/29/24 0618  BP: 97/74 97/60 113/83 (!) 87/65     10. Acute on CKD: Now on HD MWF due to ischemic ATN/shock. Schedule HD at the end of the day to help with tolerance of therapy             --continue Phoslo for now--should improve with renal restrictions.   Continue fluid restriction  Will  provide list of dietary guidelines while on dialysis    11. Acute on chronic anemia: continue Aranesp weekly    12. HFrEF: Strict I/O. Daily weights. Fluid status being managed with HD.   Reviewed weights and they are stable-- 3/1 stable Filed Weights   01/28/24 0500 01/28/24 1310 01/29/24 0618  Weight: 70.7 kg 70.3 kg 70.6 kg    22. Depression: continue Zoloft 25mg  daily, Remeron 7.5mg  daily; seroquel d/c'd. Provide ego support.    23. Constipation: Increase colace to BID, miralax daily, d/c senna-docusate  -01/29/24 LBM yesterday, cont home regimen  24. Fatigue: improved, fludricortisone restarted due to symptoms concerning for orthostasis, continue  25. Type 2 diabetes: d/c feeding supplement  Improved, d/c CBGs  CBG (last 3)  Recent Labs    01/26/24 1223 01/26/24 2035 01/27/24 0605  GLUCAP 76 168* 122*     26.  Nausea/vomiting.  Resolved  27. Blurred vision: discussed could be 2/2 hypotension, resolved, fludricortisone restarted  LOS: 17 days A FACE TO FACE EVALUATION WAS PERFORMED  [address] 01/29/2024, 10:26 AM

## 2024-01-29 NOTE — Progress Notes (Signed)
 INPATIENT REHABILITATION DISCHARGE NOTE   Discharge instructions by: France Ravens  Verbalized understanding:yes  Skin care/Wound care healing? none  Pain: none  IV's: Right HD cath--dressing intact   Tubes/Drains:none  O2: RA  Safety instructions: completed  Patient belongings: taken with pt  Discharged to: home  Discharged via: private vehicle  Notes:   CKD diet education complete and material provided.. pt in agreement

## 2024-01-30 ENCOUNTER — Telehealth: Payer: Self-pay | Admitting: Nephrology

## 2024-01-30 ENCOUNTER — Other Ambulatory Visit: Payer: Self-pay | Admitting: Nephrology

## 2024-01-30 NOTE — Telephone Encounter (Signed)
 Transition of Care Contact from Inpatient Facility   Date of Discharge: 01/28/24 Date of Contact: 01/30/24 Method of contact: phone Talked to patient   Patient contacted to discuss transition of care form recent hospitaliztion. Patient was admitted to [facility] from 2/12 to 01/28/24 with the discharge diagnosis of AoCKD 3 ischemic ATN in setting of shock now on HD, S/P MVR complicated by cardiac tamponade and shock.      Medication changes were reviewed. Advised to bring medication she is taking to dialysis tomorrow.   Patient will follow up with is outpatient dialysis center 01/30/24.  Other follow up needs include none identified.    Virgina Norfolk, PA-C [employer]

## 2024-01-31 ENCOUNTER — Other Ambulatory Visit (HOSPITAL_COMMUNITY): Payer: Self-pay | Admitting: Cardiology

## 2024-01-31 DIAGNOSIS — I314 Cardiac tamponade: Secondary | ICD-10-CM | POA: Insufficient documentation

## 2024-01-31 NOTE — Progress Notes (Signed)
 Late Note Entry- January 31, 2024  [facility] aware pt was d/c on Saturday and should start today as planned.   Olivia Canter Renal Navigator [phone]

## 2024-02-01 ENCOUNTER — Ambulatory Visit: Payer: PPO | Attending: Cardiovascular Disease

## 2024-02-01 ENCOUNTER — Telehealth: Payer: Self-pay

## 2024-02-01 ENCOUNTER — Other Ambulatory Visit: Payer: Self-pay

## 2024-02-01 DIAGNOSIS — Z952 Presence of prosthetic heart valve: Secondary | ICD-10-CM

## 2024-02-01 DIAGNOSIS — I4891 Unspecified atrial fibrillation: Secondary | ICD-10-CM | POA: Insufficient documentation

## 2024-02-01 DIAGNOSIS — Z7901 Long term (current) use of anticoagulants: Secondary | ICD-10-CM | POA: Diagnosis not present

## 2024-02-01 LAB — POCT INR: INR: 1.1 — AB (ref 2.0–3.0)

## 2024-02-01 NOTE — Patient Instructions (Addendum)
 Take 2 tablets today only then continue 1 tablet daily.  INR in 1 week.  [phone]  Dialysis M-W-F  A full discussion of the nature of anticoagulants has been carried out.  A benefit risk analysis has been presented to the patient, so that they understand the justification for choosing anticoagulation at this time. The need for frequent and regular monitoring, precise dosage adjustment and compliance is stressed.  Side effects of potential bleeding are discussed.  The patient should avoid any OTC items containing aspirin or ibuprofen, and should avoid great swings in general diet.  Avoid alcohol consumption.  Call if any signs of abnormal bleeding.

## 2024-02-01 NOTE — Telephone Encounter (Signed)
 Caller need to know which is non-weight bearing? And sternum precautions are in place. Call back phone [phone]

## 2024-02-01 NOTE — Telephone Encounter (Signed)
 Sherice with [facility] called:  She's scheduled for a PT & OT  evaluation.  Will the patient still have the same precautions & restrictions, that where in the hospital?    Call back phone [phone]

## 2024-02-01 NOTE — Telephone Encounter (Signed)
 Lpmtcb to confirm location of Warfarin 5 mg tablets.

## 2024-02-01 NOTE — Telephone Encounter (Signed)
 I spoke to the patient and she confirmed taking Coumadin 5 mg tablet daily.

## 2024-02-03 NOTE — Telephone Encounter (Signed)
 RE: Received: 2 days ago Raulkar, Drema Pry, MD sent to Charise Carwin, RMA Caller: Unspecified (2 days ago,  2:06 PM) She has no weight bearing restrictions besides the sternal precautions!  Caller has been informed of Dr. Carlis Abbott reply.

## 2024-02-07 ENCOUNTER — Inpatient Hospital Stay (HOSPITAL_COMMUNITY)
Admission: EM | Admit: 2024-02-07 | Discharge: 2024-02-11 | DRG: 291 | Discharge status: Against Medical Advice | Source: Ambulatory Visit | Attending: Internal Medicine | Admitting: Internal Medicine

## 2024-02-07 ENCOUNTER — Emergency Department (HOSPITAL_COMMUNITY)

## 2024-02-07 ENCOUNTER — Encounter (HOSPITAL_COMMUNITY): Payer: Self-pay | Admitting: Family Medicine

## 2024-02-07 DIAGNOSIS — J9601 Acute respiratory failure with hypoxia: Secondary | ICD-10-CM | POA: Insufficient documentation

## 2024-02-07 DIAGNOSIS — I951 Orthostatic hypotension: Secondary | ICD-10-CM | POA: Diagnosis present

## 2024-02-07 DIAGNOSIS — I428 Other cardiomyopathies: Secondary | ICD-10-CM | POA: Diagnosis present

## 2024-02-07 DIAGNOSIS — I5022 Chronic systolic (congestive) heart failure: Secondary | ICD-10-CM

## 2024-02-07 DIAGNOSIS — Z803 Family history of malignant neoplasm of breast: Secondary | ICD-10-CM

## 2024-02-07 DIAGNOSIS — R570 Cardiogenic shock: Secondary | ICD-10-CM | POA: Diagnosis present

## 2024-02-07 DIAGNOSIS — Z888 Allergy status to other drugs, medicaments and biological substances status: Secondary | ICD-10-CM

## 2024-02-07 DIAGNOSIS — N2581 Secondary hyperparathyroidism of renal origin: Secondary | ICD-10-CM | POA: Diagnosis present

## 2024-02-07 DIAGNOSIS — E785 Hyperlipidemia, unspecified: Secondary | ICD-10-CM | POA: Diagnosis present

## 2024-02-07 DIAGNOSIS — I480 Paroxysmal atrial fibrillation: Secondary | ICD-10-CM | POA: Diagnosis present

## 2024-02-07 DIAGNOSIS — I5033 Acute on chronic diastolic (congestive) heart failure: Secondary | ICD-10-CM | POA: Diagnosis present

## 2024-02-07 DIAGNOSIS — I312 Hemopericardium, not elsewhere classified: Secondary | ICD-10-CM | POA: Diagnosis present

## 2024-02-07 DIAGNOSIS — N186 End stage renal disease: Secondary | ICD-10-CM | POA: Diagnosis present

## 2024-02-07 DIAGNOSIS — R55 Syncope and collapse: Secondary | ICD-10-CM | POA: Diagnosis present

## 2024-02-07 DIAGNOSIS — I5023 Acute on chronic systolic (congestive) heart failure: Secondary | ICD-10-CM | POA: Diagnosis present

## 2024-02-07 DIAGNOSIS — Z8249 Family history of ischemic heart disease and other diseases of the circulatory system: Secondary | ICD-10-CM

## 2024-02-07 DIAGNOSIS — J810 Acute pulmonary edema: Secondary | ICD-10-CM | POA: Diagnosis present

## 2024-02-07 DIAGNOSIS — R0902 Hypoxemia: Secondary | ICD-10-CM | POA: Diagnosis not present

## 2024-02-07 DIAGNOSIS — D631 Anemia in chronic kidney disease: Secondary | ICD-10-CM | POA: Diagnosis present

## 2024-02-07 DIAGNOSIS — R6521 Severe sepsis with septic shock: Secondary | ICD-10-CM | POA: Diagnosis present

## 2024-02-07 DIAGNOSIS — Z515 Encounter for palliative care: Secondary | ICD-10-CM | POA: Diagnosis not present

## 2024-02-07 DIAGNOSIS — I314 Cardiac tamponade: Secondary | ICD-10-CM | POA: Diagnosis present

## 2024-02-07 DIAGNOSIS — I2721 Secondary pulmonary arterial hypertension: Secondary | ICD-10-CM | POA: Diagnosis present

## 2024-02-07 DIAGNOSIS — I132 Hypertensive heart and chronic kidney disease with heart failure and with stage 5 chronic kidney disease, or end stage renal disease: Principal | ICD-10-CM | POA: Diagnosis present

## 2024-02-07 DIAGNOSIS — Z7901 Long term (current) use of anticoagulants: Secondary | ICD-10-CM | POA: Diagnosis not present

## 2024-02-07 DIAGNOSIS — I4891 Unspecified atrial fibrillation: Secondary | ICD-10-CM | POA: Diagnosis present

## 2024-02-07 DIAGNOSIS — I950 Idiopathic hypotension: Secondary | ICD-10-CM | POA: Diagnosis present

## 2024-02-07 DIAGNOSIS — F32A Depression, unspecified: Secondary | ICD-10-CM | POA: Diagnosis present

## 2024-02-07 DIAGNOSIS — D696 Thrombocytopenia, unspecified: Secondary | ICD-10-CM | POA: Diagnosis present

## 2024-02-07 DIAGNOSIS — N170 Acute kidney failure with tubular necrosis: Secondary | ICD-10-CM | POA: Diagnosis present

## 2024-02-07 DIAGNOSIS — Z7952 Long term (current) use of systemic steroids: Secondary | ICD-10-CM

## 2024-02-07 DIAGNOSIS — F419 Anxiety disorder, unspecified: Secondary | ICD-10-CM | POA: Diagnosis present

## 2024-02-07 DIAGNOSIS — I445 Left posterior fascicular block: Secondary | ICD-10-CM | POA: Diagnosis present

## 2024-02-07 DIAGNOSIS — Z79899 Other long term (current) drug therapy: Secondary | ICD-10-CM

## 2024-02-07 DIAGNOSIS — Z952 Presence of prosthetic heart valve: Secondary | ICD-10-CM

## 2024-02-07 DIAGNOSIS — Z953 Presence of xenogenic heart valve: Secondary | ICD-10-CM | POA: Diagnosis not present

## 2024-02-07 DIAGNOSIS — Z992 Dependence on renal dialysis: Secondary | ICD-10-CM

## 2024-02-07 LAB — HEPATIC FUNCTION PANEL
ALT: 37 U/L (ref 0–44)
AST: 38 U/L (ref 15–41)
Albumin: 2.8 g/dL — ABNORMAL LOW (ref 3.5–5.0)
Alkaline Phosphatase: 89 U/L (ref 38–126)
Bilirubin, Direct: 2.1 mg/dL — ABNORMAL HIGH (ref 0.0–0.2)
Indirect Bilirubin: 1.6 mg/dL — ABNORMAL HIGH (ref 0.3–0.9)
Total Bilirubin: 3.7 mg/dL — ABNORMAL HIGH (ref 0.0–1.2)
Total Protein: 6.3 g/dL — ABNORMAL LOW (ref 6.5–8.1)

## 2024-02-07 LAB — CBC WITH DIFFERENTIAL/PLATELET
Abs Immature Granulocytes: 0.06 10*3/uL (ref 0.00–0.07)
Basophils Absolute: 0.1 10*3/uL (ref 0.0–0.1)
Basophils Relative: 1 %
Eosinophils Absolute: 0 10*3/uL (ref 0.0–0.5)
Eosinophils Relative: 0 %
HCT: 33.1 % — ABNORMAL LOW (ref 36.0–46.0)
Hemoglobin: 10.2 g/dL — ABNORMAL LOW (ref 12.0–15.0)
Immature Granulocytes: 1 %
Lymphocytes Relative: 20 %
Lymphs Abs: 2.1 10*3/uL (ref 0.7–4.0)
MCH: 30.4 pg (ref 26.0–34.0)
MCHC: 30.8 g/dL (ref 30.0–36.0)
MCV: 98.5 fL (ref 80.0–100.0)
Monocytes Absolute: 0.6 10*3/uL (ref 0.1–1.0)
Monocytes Relative: 6 %
Neutro Abs: 7.6 10*3/uL (ref 1.7–7.7)
Neutrophils Relative %: 72 %
Platelets: 53 10*3/uL — ABNORMAL LOW (ref 150–400)
RBC: 3.36 MIL/uL — ABNORMAL LOW (ref 3.87–5.11)
RDW: 17 % — ABNORMAL HIGH (ref 11.5–15.5)
WBC: 10.5 10*3/uL (ref 4.0–10.5)
nRBC: 0 % (ref 0.0–0.2)

## 2024-02-07 LAB — PROTIME-INR
INR: 2.6 — ABNORMAL HIGH (ref 0.8–1.2)
Prothrombin Time: 28.5 s — ABNORMAL HIGH (ref 11.4–15.2)

## 2024-02-07 LAB — BASIC METABOLIC PANEL
Anion gap: 16 — ABNORMAL HIGH (ref 5–15)
BUN: 24 mg/dL — ABNORMAL HIGH (ref 8–23)
CO2: 27 mmol/L (ref 22–32)
Calcium: 9 mg/dL (ref 8.9–10.3)
Chloride: 94 mmol/L — ABNORMAL LOW (ref 98–111)
Creatinine, Ser: 4.61 mg/dL — ABNORMAL HIGH (ref 0.44–1.00)
GFR, Estimated: 10 mL/min — ABNORMAL LOW (ref >60)
Glucose, Bld: 103 mg/dL — ABNORMAL HIGH (ref 70–99)
Potassium: 3.5 mmol/L (ref 3.5–5.1)
Sodium: 137 mmol/L (ref 135–145)

## 2024-02-07 LAB — CBC
HCT: 35.5 % — ABNORMAL LOW (ref 36.0–46.0)
Hemoglobin: 10.7 g/dL — ABNORMAL LOW (ref 12.0–15.0)
MCH: 30 pg (ref 26.0–34.0)
MCHC: 30.1 g/dL (ref 30.0–36.0)
MCV: 99.4 fL (ref 80.0–100.0)
Platelets: 43 10*3/uL — ABNORMAL LOW (ref 150–400)
RBC: 3.57 MIL/uL — ABNORMAL LOW (ref 3.87–5.11)
RDW: 17.1 % — ABNORMAL HIGH (ref 11.5–15.5)
WBC: 12.7 10*3/uL — ABNORMAL HIGH (ref 4.0–10.5)
nRBC: 0 % (ref 0.0–0.2)

## 2024-02-07 LAB — TECHNOLOGIST SMEAR REVIEW: Plt Morphology: DECREASED

## 2024-02-07 LAB — TROPONIN I (HIGH SENSITIVITY): Troponin I (High Sensitivity): 21 ng/L — ABNORMAL HIGH (ref <18)

## 2024-02-07 LAB — BRAIN NATRIURETIC PEPTIDE: B Natriuretic Peptide: >4500 pg/mL — ABNORMAL HIGH (ref 0.0–100.0)

## 2024-02-07 LAB — PHOSPHORUS: Phosphorus: 3.4 mg/dL (ref 2.5–4.6)

## 2024-02-07 MED ORDER — MIRTAZAPINE 15 MG PO TABS
7.5000 mg | ORAL_TABLET | Freq: Every day | ORAL | Status: DC
  Administered 2024-02-07 – 2024-02-10 (×4): 7.5 mg via ORAL
  Filled 2024-02-07 (×4): qty 1

## 2024-02-07 MED ORDER — AMIODARONE HCL 200 MG PO TABS
200.0000 mg | ORAL_TABLET | Freq: Every day | ORAL | Status: DC
  Administered 2024-02-09 – 2024-02-11 (×3): 200 mg via ORAL
  Filled 2024-02-07 (×4): qty 1

## 2024-02-07 MED ORDER — SERTRALINE HCL 50 MG PO TABS
25.0000 mg | ORAL_TABLET | Freq: Every day | ORAL | Status: DC
  Administered 2024-02-08 – 2024-02-11 (×4): 25 mg via ORAL
  Filled 2024-02-07 (×4): qty 1

## 2024-02-07 MED ORDER — SODIUM CHLORIDE 0.9% FLUSH
3.0000 mL | Freq: Two times a day (BID) | INTRAVENOUS | Status: DC
  Administered 2024-02-07 – 2024-02-11 (×7): 3 mL via INTRAVENOUS

## 2024-02-07 MED ORDER — FLUDROCORTISONE ACETATE 0.1 MG PO TABS
0.1000 mg | ORAL_TABLET | Freq: Every day | ORAL | Status: DC
  Administered 2024-02-08 – 2024-02-11 (×4): 0.1 mg via ORAL
  Filled 2024-02-07 (×4): qty 1

## 2024-02-07 MED ORDER — SENNOSIDES-DOCUSATE SODIUM 8.6-50 MG PO TABS
1.0000 | ORAL_TABLET | Freq: Every day | ORAL | Status: DC
  Administered 2024-02-07 – 2024-02-09 (×3): 1 via ORAL
  Filled 2024-02-07 (×3): qty 1

## 2024-02-07 MED ORDER — MIDODRINE HCL 5 MG PO TABS
20.0000 mg | ORAL_TABLET | Freq: Three times a day (TID) | ORAL | Status: DC
  Administered 2024-02-08 – 2024-02-11 (×11): 20 mg via ORAL
  Filled 2024-02-07 (×11): qty 4

## 2024-02-07 MED ORDER — ACETAMINOPHEN 325 MG PO TABS: 650.0000 mg | ORAL_TABLET | Freq: Four times a day (QID) | ORAL | Status: DC | PRN

## 2024-02-07 MED ORDER — ACETAMINOPHEN 650 MG RE SUPP: 650.0000 mg | Freq: Four times a day (QID) | RECTAL | Status: DC | PRN

## 2024-02-07 NOTE — ED Triage Notes (Signed)
 Pt BIB EMS from dialysis for shob and a syncopal episode at the start of her dialysis. Per pt she does not remember having syncopal episode but does recall staff telling her she passed out. Hypotensive at 80/40 upon EMS arrival, given 250cc NS by EMS. Last BP 119/60, 90% on RA, 100% 3LNC, denies wearing oxygen at home. Reports ShOB with exertion.

## 2024-02-07 NOTE — ED Provider Triage Note (Signed)
 Emergency Medicine Provider Triage Evaluation Note  Vanessa [name] , a 73 y.o. female ESRD on HD MWF, NICM with EF 40 to 45%, severe MR s/p MitraClip x 2 was evaluated in triage.  Pt complains of SOB and labile blood pressure. She is ESRD on HD, doesn't make urine. Felt SOB since one week ago, needing to sleep on multiple pillows at night. +DOE. Went to dialysis today and the staff told her that she passed out. Patient doesn't recall this event. States she feels okay right now. Denies CP. Doesn't feel SOB at rest, but with exertion and with lying flat she does. She also denies lightheadedness, f/c, nausea/vomiting, abdominal pain. Unsure how much of her dialysis she got today. +LEE more than normal. Doesn't wear oxygen at home. Takes midodrine for BP.  Recently admitted from 01/12/24-01/29/24 for DOE/orthopnea d/t severe MR for mitral valve replacement, and tricuspid annuloplasty ring, post-op course complicated by cardiac tamponade/cardiogenic shock, cardiorenal syndrome, and initiated dialysis at that time. Hypotension noted to be an issue. Also on coumadin.   Review of Systems  Positive: Syncope at dialysis, DOE, orthopnea, LEE Negative: Chest pain, SOB at rest, nausea/vomiting, f/c, flu-like sxs  Physical Exam  There were no vitals taken for this visit. Gen:   Awake, no distress  Resp:  Normal effort. CTAB. MSK:   Moves extremities without difficulty. 2+ pitting edema in BL LEs. Other:    Medical Decision Making  Medically screening exam initiated at 4:43 PM.  Appropriate orders placed.  Vanessa [name] was informed that the remainder of the evaluation will be completed by another provider, this initial triage assessment does not replace that evaluation, and the importance of remaining in the ED until their evaluation is complete.  Patient with syncope, known prior hypotension on midodrine, recently initiated dialysis. Ordered labs/imaging. Patient will be moved to treatment area.   Vanessa Rough, MD 02/07/24 [phone]

## 2024-02-07 NOTE — H&P (Signed)
 History and Physical    Vanessa [name] ZOX:[id number] DOB: 1951-05-25 DOA: 02/07/2024  PCP: Milus Height, PA   Patient coming from: Home   Chief Complaint: Syncope, hypotension   HPI: Vanessa [name] is a 73 y.o. female with medical history significant for ESRD, atrial fibrillation on warfarin, chronic HFrEF, and mitral valve replacement and tricuspid ring on 11/29/2023 complicated by tamponade and shock who now presents from her dialysis center after syncopal episode.  Patient had been experiencing shortness of breath and orthopnea for the past week, suspected that she was carrying too much fluid, went to dialysis today, and then woke with people telling her that she had passed out.  She apparently had a brief LOC shortly after starting dialysis.  She denies any recent chest discomfort or palpitations and had not been feeling lightheaded.  She had taken midodrine this morning.  She denies fevers, chills, or bleeding.  She had a blood pressure of 80/40 with EMS and was given 250 cc of NS prior to arrival in the ED.  ED Course: Upon arrival to the ED, patient is found to be afebrile and saturating upper 80s on room air with normal heart rate and stable blood pressure.  EKG demonstrates sinus rhythm with RAD and nonspecific IVCD.  Chest x-ray notable for cardiomegaly with vascular congestion, pulmonary edema, and small left pleural effusion.  Labs are most notable for normal potassium, normal bicarbonate, BUN 24, WBC 12,700, platelets 43,000, troponin 21, and BNP >4500.   Nephrology and cardiology were consulted by the ED physician.  Review of Systems:  All other systems reviewed and apart from HPI, are negative.  Past Medical History:  Diagnosis Date   CHF (congestive heart failure) (HCC)    CKD (chronic kidney disease)    Heart murmur    Hypertension    Moderate mitral regurgitation    moderate to severe MR with moderate pulmonary HTN   Multinodular goiter    Nonischemic  cardiomyopathy (HCC)    EF 30-35% by echo 2015   Pulmonary HTN (HCC)    moderate with PASP by echo 2015   PVC's (premature ventricular contractions)     Past Surgical History:  Procedure Laterality Date   CARDIAC CATHETERIZATION  2003   normal coronary arteries   CARDIAC CATHETERIZATION  2013  Nov   h/o nonischemic DCM EF 35-40% increase LVEDP    EXPLORATION POST OPERATIVE OPEN HEART N/A 11/29/2023   Procedure: EXPLORATION POST OPERATIVE OPEN HEART;  Surgeon: Eugenio Hoes, MD;  Location: [facility] OR;  Service: Open Heart Surgery;  Laterality: N/A;   HYSTEROSCOPY WITH D & C  12/02/2012   Procedure: DILATATION AND CURETTAGE /HYSTEROSCOPY;  Surgeon: Dorien Chihuahua. Richardson Dopp, MD;  Location: [facility] ORS;  Service: Gynecology;  Laterality: N/A;  cervical polypectomy   IR FLUORO GUIDE CV LINE LEFT  12/24/2023   IR FLUORO GUIDE CV LINE RIGHT  12/24/2023   IR REMOVAL TUN CV CATH W/O FL  01/20/2024   IR US GUIDE VASC ACCESS LEFT  12/24/2023   IR US GUIDE VASC ACCESS RIGHT  12/24/2023   LEFT AND RIGHT HEART CATHETERIZATION WITH CORONARY ANGIOGRAM N/A 02/15/2015   Procedure: LEFT AND RIGHT HEART CATHETERIZATION WITH CORONARY ANGIOGRAM;  Surgeon: Corky Crafts, MD;  Location: [facility] CATH LAB;  Service: Cardiovascular;  Laterality: N/A;   MITRAL VALVE REPLACEMENT N/A 11/29/2023   Procedure: MITRAL VALVE (MV) REPLACEMENT USING MOSAIC 310CINCH TISSUE VALVE;  Surgeon: Eugenio Hoes, MD;  Location: [facility] OR;  Service: Open  Heart Surgery;  Laterality: N/A;   RIGHT HEART CATH N/A 12/17/2023   Procedure: RIGHT HEART CATH;  Surgeon: Laurey Morale, MD;  Location: [facility] INVASIVE CV LAB;  Service: Cardiovascular;  Laterality: N/A;   RIGHT/LEFT HEART CATH AND CORONARY ANGIOGRAPHY N/A 10/12/2023   Procedure: RIGHT/LEFT HEART CATH AND CORONARY ANGIOGRAPHY;  Surgeon: Laurey Morale, MD;  Location: [facility] INVASIVE CV LAB;  Service: Cardiovascular;  Laterality: N/A;   TEE WITHOUT CARDIOVERSION N/A 10/11/2018   Procedure: TRANSESOPHAGEAL  ECHOCARDIOGRAM (TEE);  Surgeon: Laurey Morale, MD;  Location: [facility] ENDOSCOPY;  Service: Cardiovascular;  Laterality: N/A;   TEE WITHOUT CARDIOVERSION N/A 09/07/2023   Procedure: TRANSESOPHAGEAL ECHOCARDIOGRAM;  Surgeon: Laurey Morale, MD;  Location: [facility] INVASIVE CV LAB;  Service: Cardiovascular;  Laterality: N/A;   TEE WITHOUT CARDIOVERSION N/A 11/29/2023   Procedure: TRANSESOPHAGEAL ECHOCARDIOGRAM;  Surgeon: Eugenio Hoes, MD;  Location: [facility] OR;  Service: Open Heart Surgery;  Laterality: N/A;   TRANSCATHETER MITRAL EDGE TO EDGE REPAIR N/A 12/14/2018   Procedure: MITRAL VALVE REPAIR;  Surgeon: Tonny Bollman, MD;  Location: [facility] INVASIVE CV LAB;  Service: Cardiovascular;  Laterality: N/A;   TRICUSPID VALVE REPLACEMENT N/A 11/29/2023   Procedure: TRICUSPID VALVE REPAIR USING MC3 EDWARDS TRICUSPID ANNULOPLASTY RING;  Surgeon: Eugenio Hoes, MD;  Location: [facility] OR;  Service: Open Heart Surgery;  Laterality: N/A;    Social History:   reports that she has never smoked. She has never used smokeless tobacco. She reports that she does not drink alcohol and does not use drugs.  Allergies  Allergen Reactions   Spironolactone     hyperkalemia    Family History  Problem Relation Age of Onset   Breast cancer Mother 74   Heart disease Father    Breast cancer Sister 64   Colon cancer Neg Hx    Esophageal cancer Neg Hx    Rectal cancer Neg Hx    Stomach cancer Neg Hx    Thyroid disease Neg Hx      Prior to Admission medications   Medication Sig Start Date End Date Taking? Authorizing Provider  acetaminophen (TYLENOL) 325 MG tablet Take 1-2 tablets (325-650 mg total) by mouth every 4 (four) hours as needed for mild pain (pain score 1-3). 01/27/24   Love, Evlyn Kanner, PA-C  amiodarone (PACERONE) 200 MG tablet Take 1 tablet (200 mg total) by mouth daily. 01/28/24   Love, Evlyn Kanner, PA-C  ascorbic acid (VITAMIN C) 500 MG tablet Take 0.5 tablets (250 mg total) by mouth 2 (two) times daily. 01/27/24    Love, Evlyn Kanner, PA-C  docusate sodium (COLACE) 100 MG capsule Take 2 capsules (200 mg total) by mouth 2 (two) times daily. 01/27/24   Love, Evlyn Kanner, PA-C  fludrocortisone (FLORINEF) 0.1 MG tablet Take 1 tablet (0.1 mg total) by mouth daily. 01/28/24   Love, Evlyn Kanner, PA-C  lidocaine-prilocaine (EMLA) cream Apply 1 Application topically as needed (topical anesthesia for hemodialysis if Gebauers and Lidocaine injection are ineffective.). 01/27/24   Love, Evlyn Kanner, PA-C  midodrine (PROAMATINE) 10 MG tablet Take 2 tablets (20 mg total) by mouth 3 (three) times daily with meals. 01/28/24   Love, Evlyn Kanner, PA-C  mirtazapine (REMERON) 7.5 MG tablet Take 1 tablet (7.5 mg total) by mouth at bedtime. 01/27/24   Love, Evlyn Kanner, PA-C  multivitamin (RENA-VIT) TABS tablet Take 1 tablet by mouth 2 (two) times daily. 01/27/24   Love, Evlyn Kanner, PA-C  polyethylene glycol powder (GLYCOLAX/MIRALAX) 17 GM/SCOOP powder Take 17  g by mouth daily as needed. 01/27/24   Love, Evlyn Kanner, PA-C  senna-docusate (SENOKOT-S) 8.6-50 MG tablet Take 1 tablet by mouth at bedtime. 01/27/24   Love, Evlyn Kanner, PA-C  sertraline (ZOLOFT) 25 MG tablet Take 1 tablet (25 mg total) by mouth daily. 01/28/24   Love, Evlyn Kanner, PA-C  sodium chloride (OCEAN) 0.65 % SOLN nasal spray Place 1 spray into both nostrils as needed for congestion. 01/27/24   Jacquelynn Cree, PA-C  warfarin (COUMADIN) 5 MG tablet Take 1 tablet (5 mg total) by mouth daily at 4 PM. 01/28/24   Jacquelynn Cree, [place]    Physical Exam: Vitals:   02/07/24 2030 02/07/24 2100 02/07/24 2130 02/07/24 2147  BP: (!) 127/90 (!) 130/114 125/78   Pulse: 87 88    Resp: 16 (!) 22 (!) 26   Temp:    98.2 F (36.8 C)  TempSrc:    Oral  SpO2: (!) 88% 95%    Weight:      Height:         Constitutional: NAD, no pallor or diaphoresis  Eyes: PERTLA, lids and conjunctivae normal ENMT: Mucous membranes are moist. Posterior pharynx clear of any exudate or lesions.   Neck: supple, no masses   Respiratory: Fine rales b/l. Speaking full sentences.   Cardiovascular: S1 & S2 heard, regular rate and rhythm. B/l LE edema. JVD. Abdomen: no tenderness, soft. Bowel sounds active.  Musculoskeletal: no clubbing / cyanosis. No joint deformity upper and lower extremities.   Skin: no significant rashes, lesions, ulcers. Warm, dry, well-perfused. Neurologic: CN 2-12 grossly intact. Moving all extremities. Alert and oriented.  Psychiatric: Calm. Cooperative.    Labs and Imaging on Admission: I have personally reviewed following labs and imaging studies  CBC: Recent Labs  Lab 02/07/24 1513  WBC 12.7*  HGB 10.7*  HCT 35.5*  MCV 99.4  PLT 43*   Basic Metabolic Panel: Recent Labs  Lab 02/07/24 1831  NA 137  K 3.5  CL 94*  CO2 27  GLUCOSE 103*  BUN 24*  CREATININE 4.61*  CALCIUM 9.0   GFR: Estimated Creatinine Clearance: 10.9 mL/min (A) (by C-G formula based on SCr of 4.61 mg/dL (H)). Liver Function Tests: No results for input(s): "AST", "ALT", "ALKPHOS", "BILITOT", "PROT", "ALBUMIN" in the last 168 hours. No results for input(s): "LIPASE", "AMYLASE" in the last 168 hours. No results for input(s): "AMMONIA" in the last 168 hours. Coagulation Profile: Recent Labs  Lab 02/01/24 1037 02/07/24 2126  INR 1.1* 2.6*   Cardiac Enzymes: No results for input(s): "CKTOTAL", "CKMB", "CKMBINDEX", "TROPONINI" in the last 168 hours. BNP (last 3 results) No results for input(s): "PROBNP" in the last 8760 hours. HbA1C: No results for input(s): "HGBA1C" in the last 72 hours. CBG: No results for input(s): "GLUCAP" in the last 168 hours. Lipid Profile: No results for input(s): "CHOL", "HDL", "LDLCALC", "TRIG", "CHOLHDL", "LDLDIRECT" in the last 72 hours. Thyroid Function Tests: No results for input(s): "TSH", "T4TOTAL", "FREET4", "T3FREE", "THYROIDAB" in the last 72 hours. Anemia Panel: No results for input(s): "VITAMINB12", "FOLATE", "FERRITIN", "TIBC", "IRON", "RETICCTPCT" in the  last 72 hours. Urine analysis:    Component Value Date/Time   COLORURINE YELLOW 11/25/2023 1444   APPEARANCEUR CLEAR 11/25/2023 1444   LABSPEC 1.008 11/25/2023 1444   PHURINE 5.0 11/25/2023 1444   GLUCOSEU 150 (A) 11/25/2023 1444   GLUCOSEU NEGATIVE 01/18/2007 0927   HGBUR NEGATIVE 11/25/2023 1444   BILIRUBINUR NEGATIVE 11/25/2023 1444   KETONESUR NEGATIVE 11/25/2023 1444  PROTEINUR NEGATIVE 11/25/2023 1444   UROBILINOGEN 0.2 mg/dL 78/29/5621 3086   NITRITE NEGATIVE 11/25/2023 1444   LEUKOCYTESUR NEGATIVE 11/25/2023 1444   Sepsis Labs: @LABRCNTIP (procalcitonin:4,lacticidven:4) )No results found for this or any previous visit (from the past 240 hours).   Radiological Exams on Admission: DG Chest 1 View Result Date: 02/07/2024 CLINICAL DATA:  Syncope.  Orthopnea. EXAM: CHEST  1 VIEW COMPARISON:  Chest radiograph 01/15/2024. FINDINGS: Right-sided dialysis catheter in similar position. There is cardiomegaly with vascular congestion and edema, progressed since the prior radiograph. Small left pleural effusion and left lung base atelectasis or infiltrate. No pneumothorax. Median sternotomy wires. No acute osseous pathology. IMPRESSION: 1. Cardiomegaly with vascular congestion and edema. 2. Small left pleural effusion and left lung base atelectasis or infiltrate. Electronically Signed   By: Elgie Collard M.D.   On: 02/07/2024 19:59    EKG: Independently reviewed. Sinus rhythm, RAD, IVCD.   Assessment/Plan   1. Syncope  - Continue cardiac monitoring, check orthostatics, check echocardiogram, continue midodrine and florinef    2. Acute on chronic HFrEF; acute hypoxic respiratory failure; s/p bioprosthetic MVR and tricuspid ring - Appreciate cardiology consultation  - Restrict fluids, monitor weight and I/Os, follow-up on echo findings   3. ESRD  - Hypervolemic on admission with AHRF, not in distress  - Nephrology consulted by ED physician  - SLIV, restrict fluids, renally-dose  medications, repeat chem panel in am   4. Thrombocytopenia  - Platelet 43k on admission - No over bleeding  - No apparent infection, etiology unclear  - Save peripheral smear for review, check LFTs, coags, HIV, and HCV, hold anticoagulation for now, repeat CBC in am   5. Atrial fibrillation  - Continue amiodarone - Platelets decreased to 43k, will hold warfarin while investigating   6. Hypotension  - Continue midodrine and florinef    7. Depression, anxiety  - Zoloft, Remeron    DVT prophylaxis: warfarin pta, held on admission  Code Status: Full  Level of Care: Level of care: Telemetry Cardiac Family Communication: None present   Disposition Plan:  Patient is from: Home  Anticipated d/c is to: TBD Anticipated d/c date is: 02/11/24  Patient currently: Pending syncope workup, improved respiratory status, stable platelets   Consults called: Cardiology, nephrology  Admission status: Inpatient     Briscoe Deutscher, MD [facility]  02/07/2024, 10:24 PM

## 2024-02-07 NOTE — Consult Note (Signed)
 Cardiology Consultation   Patient ID: Vanessa [name] MRN: [id number]; DOB: 1951/09/21  Admit date: 02/07/2024 Date of Consult: 02/07/2024  PCP:  Milus Height, PA   [facility] Providers Cardiologist:  None  Advanced Heart Failure:  Marca Ancona, MD       Patient Profile:   Vanessa [name] is a 73 y.o. female with a hx of CKD, nonischemic cardiomyopathy with EF 40 to 45%, severe MR status post MitraClip x 2 who had recent complex hospital course for mitral valve replacement and tricuspid repair complicated by post op cardiac tamponade with hemopericardium requiring clot removal IntraOp, hemorrhagic shock, ATN now on ESRD, A-fib and hypoxic respiratory failure who is being seen 02/07/2024 for the evaluation of syncope, volume overload at the request of ED.  History of Present Illness:   Vanessa [name] has a complex history as above.  Her most recent hospitalization from 12/20 for mitral valve replacement and tricuspid repair was complicated by cardiac tamponade and hemopericardium with clot removal IntraOp.  Postop course was also complicated by respiratory distress requiring intubation, A-fib, ATN which now she is ESRD, post cardiotomy cardiogenic and septic shock.  She had persistent postop vasoplegia requiring midodrine, fludricort and at one time methylene blue for blood pressure support.  EF most recently was 35 to 40% with trivial MR and no pericardial effusion.  Moderate RV dysfunction.  Hemodynamics on 1/17 noted a CI 1.74, RA 6, wedge 5.  Marked deconditioning and orthostasis.  She was discharged on 3/1.  Since discharge she has noted increased shortness of breath, orthopnea. she is unclear if dialysis is removing fluid or just ultrafiltrating but since that time she has noted increasing shortness of breath and volume overload.  Today at dialysis they were unable to remove any fluid given low blood pressures and syncopal event for which she was sent to the ED.  She denies any  chest pain or additional syncope.  Denies any palpitations, fevers, chills.  To EMS she was noted to be hypotensive with blood pressures 80/40, she was given 250 cc normal saline.  BP improved to 119/60, she was hypoxic at 90% on room air for which improved on 2 to 3 L nasal cannula.  She was not discharged on any oxygen.  She feels improved in the ED.  Of note on my exam and witnessed aspiration of applesauce with long coughing fit.  X-ray notable for pulmonary edema, bibasilar pleural effusions.  EKG with sinus rhythm, left posterior fascicular block.  BNP greater than 4500 (prior 926 this was pre-ESRD), Trop 21.  K3.5, BUN 24, WBC 12.7.  INR 1.1.  Past Medical History:  Diagnosis Date   CHF (congestive heart failure) (HCC)    CKD (chronic kidney disease)    Heart murmur    Hypertension    Moderate mitral regurgitation    moderate to severe MR with moderate pulmonary HTN   Multinodular goiter    Nonischemic cardiomyopathy (HCC)    EF 30-35% by echo 2015   Pulmonary HTN (HCC)    moderate with PASP by echo 2015   PVC's (premature ventricular contractions)     Past Surgical History:  Procedure Laterality Date   CARDIAC CATHETERIZATION  2003   normal coronary arteries   CARDIAC CATHETERIZATION  2013  Nov   h/o nonischemic DCM EF 35-40% increase LVEDP    EXPLORATION POST OPERATIVE OPEN HEART N/A 11/29/2023   Procedure: EXPLORATION POST OPERATIVE OPEN HEART;  Surgeon: Eugenio Hoes, MD;  Location:  [facility] OR;  Service: Open Heart Surgery;  Laterality: N/A;   HYSTEROSCOPY WITH D & C  12/02/2012   Procedure: DILATATION AND CURETTAGE /HYSTEROSCOPY;  Surgeon: Dorien Chihuahua. Richardson Dopp, MD;  Location: [facility] ORS;  Service: Gynecology;  Laterality: N/A;  cervical polypectomy   IR FLUORO GUIDE CV LINE LEFT  12/24/2023   IR FLUORO GUIDE CV LINE RIGHT  12/24/2023   IR REMOVAL TUN CV CATH W/O FL  01/20/2024   IR US GUIDE VASC ACCESS LEFT  12/24/2023   IR US GUIDE VASC ACCESS RIGHT  12/24/2023   LEFT AND RIGHT HEART  CATHETERIZATION WITH CORONARY ANGIOGRAM N/A 02/15/2015   Procedure: LEFT AND RIGHT HEART CATHETERIZATION WITH CORONARY ANGIOGRAM;  Surgeon: Corky Crafts, MD;  Location: [facility] CATH LAB;  Service: Cardiovascular;  Laterality: N/A;   MITRAL VALVE REPLACEMENT N/A 11/29/2023   Procedure: MITRAL VALVE (MV) REPLACEMENT USING MOSAIC 310CINCH TISSUE VALVE;  Surgeon: Eugenio Hoes, MD;  Location: [facility] OR;  Service: Open Heart Surgery;  Laterality: N/A;   RIGHT HEART CATH N/A 12/17/2023   Procedure: RIGHT HEART CATH;  Surgeon: Laurey Morale, MD;  Location: [facility] INVASIVE CV LAB;  Service: Cardiovascular;  Laterality: N/A;   RIGHT/LEFT HEART CATH AND CORONARY ANGIOGRAPHY N/A 10/12/2023   Procedure: RIGHT/LEFT HEART CATH AND CORONARY ANGIOGRAPHY;  Surgeon: Laurey Morale, MD;  Location: [facility] INVASIVE CV LAB;  Service: Cardiovascular;  Laterality: N/A;   TEE WITHOUT CARDIOVERSION N/A 10/11/2018   Procedure: TRANSESOPHAGEAL ECHOCARDIOGRAM (TEE);  Surgeon: Laurey Morale, MD;  Location: [facility] ENDOSCOPY;  Service: Cardiovascular;  Laterality: N/A;   TEE WITHOUT CARDIOVERSION N/A 09/07/2023   Procedure: TRANSESOPHAGEAL ECHOCARDIOGRAM;  Surgeon: Laurey Morale, MD;  Location: [facility] INVASIVE CV LAB;  Service: Cardiovascular;  Laterality: N/A;   TEE WITHOUT CARDIOVERSION N/A 11/29/2023   Procedure: TRANSESOPHAGEAL ECHOCARDIOGRAM;  Surgeon: Eugenio Hoes, MD;  Location: [facility] OR;  Service: Open Heart Surgery;  Laterality: N/A;   TRANSCATHETER MITRAL EDGE TO EDGE REPAIR N/A 12/14/2018   Procedure: MITRAL VALVE REPAIR;  Surgeon: Tonny Bollman, MD;  Location: [facility] INVASIVE CV LAB;  Service: Cardiovascular;  Laterality: N/A;   TRICUSPID VALVE REPLACEMENT N/A 11/29/2023   Procedure: TRICUSPID VALVE REPAIR USING MC3 EDWARDS TRICUSPID ANNULOPLASTY RING;  Surgeon: Eugenio Hoes, MD;  Location: [facility] OR;  Service: Open Heart Surgery;  Laterality: N/A;     Home Medications:  Prior to Admission medications   Medication Sig  Start Date End Date Taking? Authorizing Provider  acetaminophen (TYLENOL) 325 MG tablet Take 1-2 tablets (325-650 mg total) by mouth every 4 (four) hours as needed for mild pain (pain score 1-3). 01/27/24   Love, Evlyn Kanner, PA-C  amiodarone (PACERONE) 200 MG tablet Take 1 tablet (200 mg total) by mouth daily. 01/28/24   Love, Evlyn Kanner, PA-C  ascorbic acid (VITAMIN C) 500 MG tablet Take 0.5 tablets (250 mg total) by mouth 2 (two) times daily. 01/27/24   Love, Evlyn Kanner, PA-C  docusate sodium (COLACE) 100 MG capsule Take 2 capsules (200 mg total) by mouth 2 (two) times daily. 01/27/24   Love, Evlyn Kanner, PA-C  fludrocortisone (FLORINEF) 0.1 MG tablet Take 1 tablet (0.1 mg total) by mouth daily. 01/28/24   Love, Evlyn Kanner, PA-C  lidocaine-prilocaine (EMLA) cream Apply 1 Application topically as needed (topical anesthesia for hemodialysis if Gebauers and Lidocaine injection are ineffective.). 01/27/24   Love, Evlyn Kanner, PA-C  midodrine (PROAMATINE) 10 MG tablet Take 2 tablets (20 mg total) by mouth 3 (three) times daily with  meals. 01/28/24   Love, Evlyn Kanner, PA-C  mirtazapine (REMERON) 7.5 MG tablet Take 1 tablet (7.5 mg total) by mouth at bedtime. 01/27/24   Love, Evlyn Kanner, PA-C  multivitamin (RENA-VIT) TABS tablet Take 1 tablet by mouth 2 (two) times daily. 01/27/24   Love, Evlyn Kanner, PA-C  polyethylene glycol powder (GLYCOLAX/MIRALAX) 17 GM/SCOOP powder Take 17 g by mouth daily as needed. 01/27/24   Love, Evlyn Kanner, PA-C  senna-docusate (SENOKOT-S) 8.6-50 MG tablet Take 1 tablet by mouth at bedtime. 01/27/24   Love, Evlyn Kanner, PA-C  sertraline (ZOLOFT) 25 MG tablet Take 1 tablet (25 mg total) by mouth daily. 01/28/24   Love, Evlyn Kanner, PA-C  sodium chloride (OCEAN) 0.65 % SOLN nasal spray Place 1 spray into both nostrils as needed for congestion. 01/27/24   Love, Evlyn Kanner, PA-C  warfarin (COUMADIN) 5 MG tablet Take 1 tablet (5 mg total) by mouth daily at 4 PM. 01/28/24   Love, Evlyn Kanner, PA-C    Inpatient  Medications: Scheduled Meds:  [START ON 02/08/2024] amiodarone  200 mg Oral Daily   [START ON 02/08/2024] fludrocortisone  0.1 mg Oral Daily   [START ON 02/08/2024] midodrine  20 mg Oral TID WC   mirtazapine  7.5 mg Oral QHS   senna-docusate  1 tablet Oral QHS   [START ON 02/08/2024] sertraline  25 mg Oral Daily   sodium chloride flush  3 mL Intravenous Q12H   Continuous Infusions:  PRN Meds: acetaminophen **OR** acetaminophen  Allergies:    Allergies  Allergen Reactions   Spironolactone     hyperkalemia    Social History:   Social History   Socioeconomic History   Marital status: Single    Spouse name: Not on file   Number of children: Not on file   Years of education: Not on file   Highest education level: Not on file  Occupational History   Occupation: Retired    Comment: Textile, [employer]  Tobacco Use   Smoking status: Never   Smokeless tobacco: Never  Vaping Use   Vaping status: Never Used  Substance and Sexual Activity   Alcohol use: No    Alcohol/week: 0.0 standard drinks of alcohol   Drug use: No   Sexual activity: Not on file  Other Topics Concern   Not on file  Social History Narrative   Not on file   Social Drivers of Health   Financial Resource Strain: Not on file  Food Insecurity: No Food Insecurity (11/30/2023)   Hunger Vital Sign    Worried About Running Out of Food in the Last Year: Never true    Ran Out of Food in the Last Year: Never true  Transportation Needs: No Transportation Needs (11/30/2023)   PRAPARE - Administrator, Civil Service (Medical): No    Lack of Transportation (Non-Medical): No  Physical Activity: Not on file  Stress: Not on file  Social Connections: Moderately Integrated (11/30/2023)   Social Connection and Isolation Panel [NHANES]    Frequency of Communication with Friends and Family: More than three times a week    Frequency of Social Gatherings with Friends and Family: Three times a week     Attends Religious Services: More than 4 times per year    Active Member of Clubs or Organizations: Yes    Attends Banker Meetings: More than 4 times per year    Marital Status: Separated  Intimate Partner Violence: Not At Risk (11/30/2023)  Humiliation, Afraid, Rape, and Kick questionnaire    Fear of Current or Ex-Partner: No    Emotionally Abused: No    Physically Abused: No    Sexually Abused: No    Family History:    Family History  Problem Relation Age of Onset   Breast cancer Mother 48   Heart disease Father    Breast cancer Sister 64   Colon cancer Neg Hx    Esophageal cancer Neg Hx    Rectal cancer Neg Hx    Stomach cancer Neg Hx    Thyroid disease Neg Hx      ROS:  Please see the history of present illness.   All other ROS reviewed and negative.     Physical Exam/Data:   Vitals:   02/07/24 1719 02/07/24 2002 02/07/24 2030 02/07/24 2100  BP: 112/72  (!) 127/90 (!) 130/114  Pulse: 78  87 88  Resp: 16  16 (!) 22  Temp: 97.7 F (36.5 C)     TempSrc: Oral     SpO2: 100%  (!) 88% 95%  Weight:  78.5 kg    Height:  5\' 3"  (1.6 m)      Intake/Output Summary (Last 24 hours) at 02/07/2024 2146 Last data filed at 02/07/2024 1508 Gross per 24 hour  Intake 250 ml  Output --  Net 250 ml      02/07/2024    8:02 PM 01/29/2024    6:18 AM 01/28/2024    1:10 PM  Last 3 Weights  Weight (lbs) 173 lb 155 lb 10.3 oz 154 lb 15.7 oz  Weight (kg) 78.472 kg 70.6 kg 70.3 kg     Body mass index is 30.65 kg/m.  General:  Well nourished, well developed, mild distress and coughing from witnessed aspiration HEENT: normal Neck: AT to mid neck Vascular: No carotid bruits; Distal pulses 2+ bilaterally Cardiac:  normal S1, S2; RRR; no murmur  Lungs: Coarse crackles to mid lung with decreased breath sounds at the bases Abd: soft, nontender, no hepatomegaly  Ext: 1+ edema Musculoskeletal:  No deformities, BUE and BLE strength normal and equal Skin: warm and dry   Neuro:  CNs 2-12 intact, no focal abnormalities noted Psych:  Normal affect   EKG:  The EKG was personally reviewed and demonstrates: As above Telemetry:  Telemetry was personally reviewed and demonstrates: Sinus rhythm  Relevant CV Studies:  TTE 12/14/2023 IMPRESSIONS  1. Left ventricular ejection fraction, by estimation, is 35 to 40% . The left ventricle has moderately decreased function. The left ventricle demonstrates regional wall motion abnormalities ( see scoring diagram/ findings for description) . There is mild asymmetric left ventricular hypertrophy of the infero- lateral segment. Left ventricular diastolic function could not be evaluated.  2. Right ventricular systolic function is moderately reduced. The right ventricular size is moderately enlarged. There is severely elevated pulmonary artery systolic pressure. The estimated right ventricular systolic pressure is 64. 8 mmHg.  3. Left atrial size was moderately dilated.  4. The mitral valve has been repaired/ replaced. Trivial mitral valve regurgitation. Moderate mitral stenosis. The mean mitral valve gradient is 6. 0 mmHg with average heart rate of 90 bpm. There is a 31 mm Medtronic bioprosthetic valve present in the mitral position. Procedure Date: 12/ 30/ 24.  5. TriRing T30 implanted 12/ 30/ 24, echo findings are consistent with normal structure and central tricuspid regurgitation. Tricuspid valve gradient 1. 4 mm HG at heart rate of 88 bpm. The tricuspid valve is has been repaired/  replaced. Tricuspid valve regurgitation is mild to moderate.  6. The aortic valve is tricuspid. Aortic valve regurgitation is trivial. Mild aortic valve stenosis. Aortic valve mean gradient measures 16. 0 mmHg. Aortic valve Vmax measures 2. 71 m/ s.  RHC 12/17/2023 1. Low filling pressures.  2. Mild pulmonary artery hypertension 3. Low cardiac output, CI 1.74 by Fick.  4. Preserved PAPi   I will restart milrinone at 0.125 mcg/kg/min  Laboratory  Data:  High Sensitivity Troponin:   Recent Labs  Lab 02/07/24 1643  TROPONINIHS 21*     Chemistry Recent Labs  Lab 02/07/24 1831  NA 137  K 3.5  CL 94*  CO2 27  GLUCOSE 103*  BUN 24*  CREATININE 4.61*  CALCIUM 9.0  GFRNONAA 10*  ANIONGAP 16*    No results for input(s): "PROT", "ALBUMIN", "AST", "ALT", "ALKPHOS", "BILITOT" in the last 168 hours. Lipids No results for input(s): "CHOL", "TRIG", "HDL", "LABVLDL", "LDLCALC", "CHOLHDL" in the last 168 hours.  Hematology Recent Labs  Lab 02/07/24 1513  WBC 12.7*  RBC 3.57*  HGB 10.7*  HCT 35.5*  MCV 99.4  MCH 30.0  MCHC 30.1  RDW 17.1*  PLT 43*   Thyroid No results for input(s): "TSH", "FREET4" in the last 168 hours.  BNP Recent Labs  Lab 02/07/24 1831  BNP >4,500.0*    DDimer No results for input(s): "DDIMER" in the last 168 hours.   Radiology/Studies:  DG Chest 1 View Result Date: 02/07/2024 CLINICAL DATA:  Syncope.  Orthopnea. EXAM: CHEST  1 VIEW COMPARISON:  Chest radiograph 01/15/2024. FINDINGS: Right-sided dialysis catheter in similar position. There is cardiomegaly with vascular congestion and edema, progressed since the prior radiograph. Small left pleural effusion and left lung base atelectasis or infiltrate. No pneumothorax. Median sternotomy wires. No acute osseous pathology. IMPRESSION: 1. Cardiomegaly with vascular congestion and edema. 2. Small left pleural effusion and left lung base atelectasis or infiltrate. Electronically Signed   By: Elgie Collard M.D.   On: 02/07/2024 19:59   Assessment and Plan:   Acute hypoxic respiratory failure secondary to pulmonary edema Chronic systolic heart failure secondary nonischemic cardiomyopathy (EF35%) Moderate RV dysfunction  Severe MR status post MitraClip failed x 2 now with bMVR, Tvr ESRD on HD complicated by vasoplegia Patient noted with acute hypoxic respiratory failure with evidence of pulmonary edema and volume overload in the setting of ESRD.   Unclear if she has been receiving adequate volume removal as an outpatient as her course has been complicated by intermittent hypotension requiring midodrine.  She had an episode of syncope with hypotension today improving with fluids, do not suspect that syncope was arrhythmia in nature but likely secondary to her orthostasis/vasoplegia status post her prolonged hospital course.  She requires admission for volume removal and dialysis tomorrow morning.  Is also high risk for infection and does have a leukocytosis so would be on the look out for any infectious etiology.  Did have witnessed aspiration by me, consider SLP consult and if further worsening of hypoxia would consider abx. Agree with repeat transthoracic echocardiogram but do not suspect that new valvular issues or gross cardiac decompensation based on exam at this time.  Her INR is 1.1 and would heparinize given the possibility of need for repeat right heart catheterization as she has had complex hemodynamics in the past. She is perfusing well and if improves with just dialysis then can quickly rebridge and restart warfarin for INR 2-3. Clinically she is perfusing well at this time. - agree  with dialysis and volume removal in AM - agree with repeat TTE - Telemetry given syncope and low EF - would start heparin given INR 1.1 and until improved respiratory failure with dialysis in case further hemodynamics are needed (can speak with team in AM as brachial access can be done with elevated INR so they may by okay with restarting warfarin) - continue midodrine and fludrocortisone  - repeat TSH  6.  A-fib on warfarin - continue amiodarone - start heparin gtt as bridge  - warfarin restart if respiratory failure improves with dialysis and no need for repeat RHC   Risk Assessment/Risk Scores:        New York Heart Association (NYHA) Functional Class NYHA Class III  CHA2DS2-VASc Score =     This indicates a  % annual risk of stroke. The  patient's score is based upon:           For questions or updates, please contact [facility] Please consult [email or web address] for contact info under    Signed, Thana Ates, MD  02/07/2024 9:46 PM

## 2024-02-07 NOTE — ED Notes (Signed)
 Pt takes Warfarin at 16:00. Pt would like to know if medication can be ordered.

## 2024-02-07 NOTE — ED Provider Notes (Signed)
 [place] AT [facility] Provider Note   CSN: [id number] Arrival date & time: 02/07/24  1451     History  Chief Complaint  Patient presents with   Shortness of Breath   Loss of Consciousness    Vanessa [name] is a 73 y.o. female.  Patient presents with history of cardiomyopathy ejection fraction 40%, severe mitral regurg status post procedure valve replacement February 12 to January 29, 2024 due to significant symptoms.  Patient had cardiac tamponade/shock/renal failure which led her to being on dialysis during/after that major surgery.  Patient's had recurrent low blood pressure challenges since then.  Patient was to starting dialysis and had syncopal event shortly after.  Patient with known similar issues on midodrine.  Patient's blood pressure was 80/40 for EMS given 250 cc of fluid improved to 119/60.  Patient on home oxygen requiring 3 L.  Exertional shortness of breath.  The history is provided by the patient and a relative.  Shortness of Breath Associated symptoms: syncope   Associated symptoms: no abdominal pain, no chest pain, no fever, no headaches, no neck pain, no rash and no vomiting   Loss of Consciousness Associated symptoms: shortness of breath   Associated symptoms: no chest pain, no fever, no headaches and no vomiting        Home Medications Prior to Admission medications   Medication Sig Start Date End Date Taking? Authorizing Provider  acetaminophen (TYLENOL) 325 MG tablet Take 1-2 tablets (325-650 mg total) by mouth every 4 (four) hours as needed for mild pain (pain score 1-3). 01/27/24   Love, Evlyn Kanner, PA-C  amiodarone (PACERONE) 200 MG tablet Take 1 tablet (200 mg total) by mouth daily. 01/28/24   Love, Evlyn Kanner, PA-C  ascorbic acid (VITAMIN C) 500 MG tablet Take 0.5 tablets (250 mg total) by mouth 2 (two) times daily. 01/27/24   Love, Evlyn Kanner, PA-C  docusate sodium (COLACE) 100 MG capsule Take 2 capsules (200 mg total) by  mouth 2 (two) times daily. 01/27/24   Love, Evlyn Kanner, PA-C  fludrocortisone (FLORINEF) 0.1 MG tablet Take 1 tablet (0.1 mg total) by mouth daily. 01/28/24   Love, Evlyn Kanner, PA-C  lidocaine-prilocaine (EMLA) cream Apply 1 Application topically as needed (topical anesthesia for hemodialysis if Gebauers and Lidocaine injection are ineffective.). 01/27/24   Love, Evlyn Kanner, PA-C  midodrine (PROAMATINE) 10 MG tablet Take 2 tablets (20 mg total) by mouth 3 (three) times daily with meals. 01/28/24   Love, Evlyn Kanner, PA-C  mirtazapine (REMERON) 7.5 MG tablet Take 1 tablet (7.5 mg total) by mouth at bedtime. 01/27/24   Love, Evlyn Kanner, PA-C  multivitamin (RENA-VIT) TABS tablet Take 1 tablet by mouth 2 (two) times daily. 01/27/24   Love, Evlyn Kanner, PA-C  polyethylene glycol powder (GLYCOLAX/MIRALAX) 17 GM/SCOOP powder Take 17 g by mouth daily as needed. 01/27/24   Love, Evlyn Kanner, PA-C  senna-docusate (SENOKOT-S) 8.6-50 MG tablet Take 1 tablet by mouth at bedtime. 01/27/24   Love, Evlyn Kanner, PA-C  sertraline (ZOLOFT) 25 MG tablet Take 1 tablet (25 mg total) by mouth daily. 01/28/24   Love, Evlyn Kanner, PA-C  sodium chloride (OCEAN) 0.65 % SOLN nasal spray Place 1 spray into both nostrils as needed for congestion. 01/27/24   Jacquelynn Cree, PA-C  warfarin (COUMADIN) 5 MG tablet Take 1 tablet (5 mg total) by mouth daily at 4 PM. 01/28/24   Love, Evlyn Kanner, PA-C      Allergies  Spironolactone    Review of Systems   Review of Systems  Constitutional:  Positive for fatigue. Negative for chills and fever.  HENT:  Negative for congestion.   Eyes:  Negative for visual disturbance.  Respiratory:  Positive for shortness of breath.   Cardiovascular:  Positive for syncope. Negative for chest pain.  Gastrointestinal:  Negative for abdominal pain and vomiting.  Genitourinary:  Negative for dysuria and flank pain.  Musculoskeletal:  Negative for back pain, neck pain and neck stiffness.  Skin:  Negative for rash.  Neurological:   Positive for syncope. Negative for light-headedness and headaches.    Physical Exam Updated Vital Signs BP (!) 127/90   Pulse 87   Temp 97.7 F (36.5 C) (Oral)   Resp 16   Ht 5\' 3"  (1.6 m)   Wt 78.5 kg   SpO2 (!) 88%   BMI 30.65 kg/m  Physical Exam Vitals and nursing note reviewed.  Constitutional:      General: She is not in acute distress.    Appearance: She is well-developed. She is ill-appearing.  HENT:     Head: Normocephalic and atraumatic.     Mouth/Throat:     Mouth: Mucous membranes are moist.  Eyes:     General:        Right eye: No discharge.        Left eye: No discharge.     Conjunctiva/sclera: Conjunctivae normal.  Neck:     Trachea: No tracheal deviation.  Cardiovascular:     Rate and Rhythm: Normal rate and regular rhythm.  Pulmonary:     Effort: Tachypnea present.     Breath sounds: Examination of the right-lower field reveals decreased breath sounds and rales. Examination of the left-lower field reveals decreased breath sounds and rales. Decreased breath sounds and rales present.  Abdominal:     General: There is no distension.     Palpations: Abdomen is soft.     Tenderness: There is no abdominal tenderness. There is no guarding.  Musculoskeletal:     Cervical back: Normal range of motion and neck supple. No rigidity.     Right lower leg: Edema present.     Left lower leg: Edema present.  Skin:    General: Skin is warm.     Capillary Refill: Capillary refill takes less than 2 seconds.     Findings: No rash.  Neurological:     General: No focal deficit present.     Mental Status: She is alert.     Cranial Nerves: No cranial nerve deficit.  Psychiatric:        Mood and Affect: Mood is anxious.   100  ED Results / Procedures / Treatments   Labs (all labs ordered are listed, but only abnormal results are displayed) Labs Reviewed  CBC - Abnormal; Notable for the following components:      Result Value   WBC 12.7 (*)    RBC 3.57 (*)     Hemoglobin 10.7 (*)    HCT 35.5 (*)    RDW 17.1 (*)    Platelets 43 (*)    All other components within normal limits  BRAIN NATRIURETIC PEPTIDE - Abnormal; Notable for the following components:   B Natriuretic Peptide >4,500.0 (*)    All other components within normal limits  BASIC METABOLIC PANEL - Abnormal; Notable for the following components:   Chloride 94 (*)    Glucose, Bld 103 (*)    BUN 24 (*)  Creatinine, Ser 4.61 (*)    GFR, Estimated 10 (*)    Anion gap 16 (*)    All other components within normal limits  TROPONIN I (HIGH SENSITIVITY) - Abnormal; Notable for the following components:   Troponin I (High Sensitivity) 21 (*)    All other components within normal limits  PROTIME-INR  PHOSPHORUS  CBG MONITORING, ED  I-STAT CHEM 8, ED  I-STAT VENOUS BLOOD GAS, ED    EKG EKG Interpretation Date/Time:  Monday February 07 2024 15:37:32 EDT Ventricular Rate:  83 PR Interval:  208 QRS Duration:  126 QT Interval:  394 QTC Calculation: 462 R Axis:   99  Text Interpretation: Normal sinus rhythm Rightward axis Non-specific intra-ventricular conduction block T wave abnormality, consider inferolateral ischemia Abnormal ECG When compared with ECG of 15-Jan-2024 18:14, PREVIOUS ECG IS PRESENT Confirmed by Blane Ohara [phone]) on 02/07/2024 6:59:58 PM  Radiology DG Chest 1 View Result Date: 02/07/2024 CLINICAL DATA:  Syncope.  Orthopnea. EXAM: CHEST  1 VIEW COMPARISON:  Chest radiograph 01/15/2024. FINDINGS: Right-sided dialysis catheter in similar position. There is cardiomegaly with vascular congestion and edema, progressed since the prior radiograph. Small left pleural effusion and left lung base atelectasis or infiltrate. No pneumothorax. Median sternotomy wires. No acute osseous pathology. IMPRESSION: 1. Cardiomegaly with vascular congestion and edema. 2. Small left pleural effusion and left lung base atelectasis or infiltrate. Electronically Signed   By: Elgie Collard M.D.    On: 02/07/2024 19:59    Procedures .Critical Care  Performed by: Blane Ohara, MD Authorized by: Blane Ohara, MD   Critical care provider statement:    Critical care time (minutes):  30   Critical care start time:  02/07/2024 8:00 PM   Critical care end time:  02/07/2024 8:30 PM   Critical care time was exclusive of:  Separately billable procedures and treating other patients and teaching time   Critical care was necessary to treat or prevent imminent or life-threatening deterioration of the following conditions:  Respiratory failure   Critical care was time spent personally by me on the following activities:  Discussions with consultants, ordering and review of radiographic studies, pulse oximetry and ordering and review of laboratory studies     Medications Ordered in ED Medications - No data to display  ED Course/ Medical Decision Making/ A&P                                 Medical Decision Making Amount and/or Complexity of Data Reviewed Labs: ordered.   Patient with recent mitral valve replacement admission to the hospital 2 weeks prior presents with shortness of breath, syncopal episode at the start of dialysis feeling generally unwell.  No fever or infectious symptoms.  Unsure exactly how much fluids taken off dialysis per report not much.  Patient takes midodrine for blood pressure.  Syncopal episode likely combination of patient's known lower blood pressure requiring midodrine, recent surgery and dialysis side effects.  Patient not having any active chest pain.  Patient clinically fluid overloaded with crackles at the bases, hypoxia requiring 2 to 3 L nasal cannula.  Blood work ordered independently reviewed showing signs of fluid overload with BNP 4500, troponin 21, kidney function 24/4.6.  Patient stable on 2 to 3 L nasal cannula.  Paged nephrology, hospitalist and cardiology.  Patient will need admission, urgent dialysis and further monitoring.  Fortunately  potassium normal at this time.  Spoke with nephrologist  on-call Dr. Malen Gauze who will plan for dialysis likely tomorrow morning.        Final Clinical Impression(s) / ED Diagnoses Final diagnoses:  ESRD needing dialysis (HCC)  Hypoxia  Acute pulmonary edema (HCC)  Syncope and collapse    Rx / DC Orders ED Discharge Orders          Ordered    Phosphorus  Status:  Canceled        02/07/24 2036              Blane Ohara, MD 02/07/24 2051

## 2024-02-08 ENCOUNTER — Other Ambulatory Visit (HOSPITAL_COMMUNITY)

## 2024-02-08 ENCOUNTER — Other Ambulatory Visit: Payer: Self-pay

## 2024-02-08 ENCOUNTER — Ambulatory Visit: Payer: PPO | Admitting: "Endocrinology

## 2024-02-08 DIAGNOSIS — E785 Hyperlipidemia, unspecified: Secondary | ICD-10-CM

## 2024-02-08 DIAGNOSIS — I4891 Unspecified atrial fibrillation: Secondary | ICD-10-CM | POA: Diagnosis not present

## 2024-02-08 DIAGNOSIS — R55 Syncope and collapse: Secondary | ICD-10-CM | POA: Diagnosis not present

## 2024-02-08 DIAGNOSIS — I5033 Acute on chronic diastolic (congestive) heart failure: Secondary | ICD-10-CM

## 2024-02-08 DIAGNOSIS — R0902 Hypoxemia: Secondary | ICD-10-CM

## 2024-02-08 DIAGNOSIS — N186 End stage renal disease: Secondary | ICD-10-CM | POA: Diagnosis not present

## 2024-02-08 DIAGNOSIS — I428 Other cardiomyopathies: Secondary | ICD-10-CM

## 2024-02-08 DIAGNOSIS — F32A Depression, unspecified: Secondary | ICD-10-CM

## 2024-02-08 DIAGNOSIS — I950 Idiopathic hypotension: Secondary | ICD-10-CM

## 2024-02-08 LAB — TSH: TSH: 1.038 u[IU]/mL (ref 0.350–4.500)

## 2024-02-08 LAB — CBC
HCT: 31.1 % — ABNORMAL LOW (ref 36.0–46.0)
Hemoglobin: 9.6 g/dL — ABNORMAL LOW (ref 12.0–15.0)
MCH: 30.4 pg (ref 26.0–34.0)
MCHC: 30.9 g/dL (ref 30.0–36.0)
MCV: 98.4 fL (ref 80.0–100.0)
Platelets: 60 10*3/uL — ABNORMAL LOW (ref 150–400)
RBC: 3.16 MIL/uL — ABNORMAL LOW (ref 3.87–5.11)
RDW: 17.1 % — ABNORMAL HIGH (ref 11.5–15.5)
WBC: 7.8 10*3/uL (ref 4.0–10.5)
nRBC: 0 % (ref 0.0–0.2)

## 2024-02-08 LAB — BASIC METABOLIC PANEL
Anion gap: 12 (ref 5–15)
BUN: 28 mg/dL — ABNORMAL HIGH (ref 8–23)
CO2: 26 mmol/L (ref 22–32)
Calcium: 8.8 mg/dL — ABNORMAL LOW (ref 8.9–10.3)
Chloride: 100 mmol/L (ref 98–111)
Creatinine, Ser: 5.39 mg/dL — ABNORMAL HIGH (ref 0.44–1.00)
GFR, Estimated: 8 mL/min — ABNORMAL LOW (ref >60)
Glucose, Bld: 99 mg/dL (ref 70–99)
Potassium: 3.7 mmol/L (ref 3.5–5.1)
Sodium: 138 mmol/L (ref 135–145)

## 2024-02-08 LAB — FERRITIN: Ferritin: 160 ng/mL (ref 11–307)

## 2024-02-08 LAB — IRON AND TIBC
Iron: 27 ug/dL — ABNORMAL LOW (ref 28–170)
Saturation Ratios: 9 % — ABNORMAL LOW (ref 10.4–31.8)
TIBC: 298 ug/dL (ref 250–450)
UIBC: 271 ug/dL

## 2024-02-08 LAB — HIV ANTIBODY (ROUTINE TESTING W REFLEX): HIV Screen 4th Generation wRfx: NONREACTIVE

## 2024-02-08 LAB — PROTIME-INR
INR: 2.8 — ABNORMAL HIGH (ref 0.8–1.2)
Prothrombin Time: 30 s — ABNORMAL HIGH (ref 11.4–15.2)

## 2024-02-08 LAB — HEPATITIS B SURFACE ANTIGEN: Hepatitis B Surface Ag: NONREACTIVE

## 2024-02-08 LAB — PHOSPHORUS: Phosphorus: 4.8 mg/dL — ABNORMAL HIGH (ref 2.5–4.6)

## 2024-02-08 MED ORDER — SODIUM CHLORIDE 0.9 % IV SOLN
500.0000 mg | INTRAVENOUS | Status: AC
  Administered 2024-02-08 – 2024-02-09 (×2): 500 mg via INTRAVENOUS
  Filled 2024-02-08 (×2): qty 25

## 2024-02-08 MED ORDER — BISACODYL 5 MG PO TBEC
5.0000 mg | DELAYED_RELEASE_TABLET | Freq: Once | ORAL | Status: AC
  Administered 2024-02-08: 5 mg via ORAL
  Filled 2024-02-08: qty 1

## 2024-02-08 MED ORDER — IRON SUCROSE 500 MG IVPB - SIMPLE MED
500.0000 mg | INTRAVENOUS | Status: DC
  Filled 2024-02-08: qty 275

## 2024-02-08 MED ORDER — CHLORHEXIDINE GLUCONATE CLOTH 2 % EX PADS: 6.0000 | MEDICATED_PAD | Freq: Every day | CUTANEOUS | Status: DC

## 2024-02-08 MED ORDER — HEPARIN SODIUM (PORCINE) 1000 UNIT/ML DIALYSIS
1000.0000 [IU] | INTRAMUSCULAR | Status: DC | PRN
  Filled 2024-02-08: qty 1

## 2024-02-08 NOTE — Assessment & Plan Note (Addendum)
 TTHSat schedule Patient with volume overload.   Her BUN today is 28 and K 3,7 with serum bicarbonate at 26  P 4.8   Plan for HD today with ultrafiltration She may need further inpatient renal replacement therapy and ultrafiltration in order to get her euvolemic.  Close follow up on blood pressure and electrolytes.  Follow up with further nephrology recommendations.   Anemia of chronic renal disease with hgb at 9.6 Iron deficiency with transferrin saturation of 9 and ferritin of 160, with TIBC 271 and iron 27  Chronic thrombocytopenia with plt at 60   Add IV iron infusion.

## 2024-02-08 NOTE — Progress Notes (Signed)
 Heart Failure Navigator Progress Note  Assessed for Heart & Vascular TOC clinic readiness.  Patient does not meet criteria due to ESRD on hemodialysis.   Navigator will sign off at this time.   Rhae Hammock, BSN, Scientist, clinical (histocompatibility and immunogenetics) Only

## 2024-02-08 NOTE — Assessment & Plan Note (Deleted)
 Cell count has been stable.

## 2024-02-08 NOTE — Assessment & Plan Note (Signed)
 Echocardiogram with reduced LV systolic function 35 to 40%, RV with moderate reduction in systolic function, severe elevation in pulmonary pressure 64,8 mmHg. LA with moderate dilatation, mitral valve has been replaced, with moderate mitral valve stenosis, tricuspid valve has been repaired, positive mild to moderate tricuspid valve regurgitation, mild aortic valve stenosis.   LV with akinetic basal inferior segment, hypokinetic anterior septum, mid infero septal segment, mid inferiotn segment, basal infero septal segment.   Continue ultrafiltration with HD for volume correction  Continue midodrine for blood pressure support.

## 2024-02-08 NOTE — Consult Note (Signed)
 ESRD Consult Note  Requesting provider: [facility] requesting consult: Hospitalist Reason for consult: ESRD, provision of dialysis Indication for acute dialysis?: End Stage Renal Disease  Outpatient dialysis unit: [facility] Outpatient dialysis prescription:  F180, BFR 350, 3K, 2.5 Ca, EDW 68.6, 4hr, TDC, no esa or Vit D  Assessment/Recommendations: [name] is a/an 73 y.o. female with a past medical history notable for ESRD on HD admitted with hypotension.   # Dialysis dependent AKI: Started dialysis on 12/02/2023.  Considered AKI but likely nearing ESRD status.  Plan for ultrafiltration today as below.  Then maintain MWF schedule  # Volume/ hypertension: Significantly volume overloaded.  Ultrafiltration limited by hypotension.  Continue home midodrine.  Plan for dry ultrafiltration on dialysis today.  Will recommend starting torsemide 100 mg on nondialysis days at discharge.  If hypotension continues to be a limiting factor she may require more aggressive palliative care conversations  # Anemia of Chronic Kidney Disease: Hemoglobin 9.6 not currently receiving ESA in the outpatient setting.  Check iron stores.  Likely some dilutional component  # Secondary Hyperparathyroidism/Hyperphosphatemia: Calcium acceptable not on vitamin D.  Obtain phosphorus  # Vascular access: TDC with no issues  # Additional recommendations: - Dose all meds for creatinine clearance < 10 ml/min  - Unless absolutely necessary, no MRIs with gadolinium.  - Implement save arm precautions.  Prefer needle sticks in the dorsum of the hands or wrists.  No blood pressure measurements in arm. - If blood transfusion is requested during hemodialysis sessions, please alert [place] prior to the session.  - Use synthetic opioids (Fentanyl/Dilaudid) if needed  Recommendations were discussed with the primary team.   History of Present Illness: Vanessa [name] is a/an 73 y.o. female with a past medical history of ESRD  who presents with shortness of breath   Patient presented to the hospital yesterday with shortness of breath.  She has a history of heart failure with reduced ejection fraction as well as mitral valve replacement and tricuspid ring in December 2024 which was complicated by tamponade and shock.  She said that for the past week she had had worsening shortness of breath as well as orthopnea.  She has been going to dialysis but ultrafiltration has been limited by low blood pressure.  She had an episode of syncope while on dialysis when they were trying to pull fluid.  She did not achieve any significant ultrafiltration yesterday.  She has been taking midodrine regularly.  In the emergency department she was found to be hypoxic and blood pressure was low but stable at her baseline.  The patient states today she continues to feel shortness of breath but no dizziness or lightheadedness.  She is frustrated about being in the hospital.  Was in the hospital for a prolonged period of time in 2024.  Wants to avoid prolonged hospitalization if able.  We discussed the challenges of ultrafiltration with dialysis and low blood pressures.  She was recently discharged in February after being in rehab.  She is considered an AKI but started dialysis on 1/2. Unlikely to see recovery.  Medications:  Current Facility-Administered Medications  Medication Dose Route Frequency Provider Last Rate Last Admin   acetaminophen (TYLENOL) tablet 650 mg  650 mg Oral Q6H PRN Opyd, Lavone Neri, MD       Or   acetaminophen (TYLENOL) suppository 650 mg  650 mg Rectal Q6H PRN Opyd, Lavone Neri, MD       amiodarone (PACERONE) tablet 200 mg  200  mg Oral Daily Opyd, Lavone Neri, MD       Chlorhexidine Gluconate Cloth 2 % PADS 6 each  6 each Topical Q0600 Darnell Level, MD       fludrocortisone (FLORINEF) tablet 0.1 mg  0.1 mg Oral Daily Opyd, Lavone Neri, MD   0.1 mg at 02/08/24 1019   heparin injection 1,000 Units  1,000 Units Dialysis PRN  Darnell Level, MD       midodrine (PROAMATINE) tablet 20 mg  20 mg Oral TID WC Opyd, Lavone Neri, MD   20 mg at 02/08/24 1117   mirtazapine (REMERON) tablet 7.5 mg  7.5 mg Oral QHS Opyd, Lavone Neri, MD   7.5 mg at 02/07/24 2212   senna-docusate (Senokot-S) tablet 1 tablet  1 tablet Oral QHS Briscoe Deutscher, MD   1 tablet at 02/07/24 2212   sertraline (ZOLOFT) tablet 25 mg  25 mg Oral Daily Opyd, Lavone Neri, MD   25 mg at 02/08/24 1018   sodium chloride flush (NS) 0.9 % injection 3 mL  3 mL Intravenous Q12H Opyd, Lavone Neri, MD   3 mL at 02/08/24 1015   Current Outpatient Medications  Medication Sig Dispense Refill   amiodarone (PACERONE) 200 MG tablet Take 1 tablet (200 mg total) by mouth daily. 30 tablet 0   ascorbic acid (VITAMIN C) 500 MG tablet Take 0.5 tablets (250 mg total) by mouth 2 (two) times daily. 30 tablet 0   atorvastatin (LIPITOR) 10 MG tablet Take 10 mg by mouth daily.     docusate sodium (COLACE) 100 MG capsule Take 2 capsules (200 mg total) by mouth 2 (two) times daily. 60 capsule 0   fludrocortisone (FLORINEF) 0.1 MG tablet Take 1 tablet (0.1 mg total) by mouth daily. 30 tablet 0   lidocaine-prilocaine (EMLA) cream Apply 1 Application topically as needed (topical anesthesia for hemodialysis if Gebauers and Lidocaine injection are ineffective.). 30 g 0   midodrine (PROAMATINE) 10 MG tablet Take 2 tablets (20 mg total) by mouth 3 (three) times daily with meals. 180 tablet 0   mirtazapine (REMERON) 7.5 MG tablet Take 1 tablet (7.5 mg total) by mouth at bedtime. 30 tablet 0   multivitamin (RENA-VIT) TABS tablet Take 1 tablet by mouth 2 (two) times daily. 30 tablet 0   polyethylene glycol powder (GLYCOLAX/MIRALAX) 17 GM/SCOOP powder Take 17 g by mouth daily as needed. 476 g 0   senna-docusate (SENOKOT-S) 8.6-50 MG tablet Take 1 tablet by mouth at bedtime. 30 tablet 0   sertraline (ZOLOFT) 25 MG tablet Take 1 tablet (25 mg total) by mouth daily. 30 tablet 0   warfarin (COUMADIN) 5 MG  tablet Take 1 tablet (5 mg total) by mouth daily at 4 PM. 30 tablet 0     ALLERGIES Spironolactone  MEDICAL HISTORY Past Medical History:  Diagnosis Date   CHF (congestive heart failure) (HCC)    CKD (chronic kidney disease)    Heart murmur    Hypertension    Moderate mitral regurgitation    moderate to severe MR with moderate pulmonary HTN   Multinodular goiter    Nonischemic cardiomyopathy (HCC)    EF 30-35% by echo 2015   Pulmonary HTN (HCC)    moderate with PASP by echo 2015   PVC's (premature ventricular contractions)      SOCIAL HISTORY Social History   Socioeconomic History   Marital status: Single    Spouse name: Not on file   Number of children: Not on  file   Years of education: Not on file   Highest education level: Not on file  Occupational History   Occupation: Retired    Comment: Textile, [employer]  Tobacco Use   Smoking status: Never   Smokeless tobacco: Never  Vaping Use   Vaping status: Never Used  Substance and Sexual Activity   Alcohol use: No    Alcohol/week: 0.0 standard drinks of alcohol   Drug use: No   Sexual activity: Not on file  Other Topics Concern   Not on file  Social History Narrative   Not on file   Social Drivers of Health   Financial Resource Strain: Not on file  Food Insecurity: No Food Insecurity (02/08/2024)   Hunger Vital Sign    Worried About Running Out of Food in the Last Year: Never true    Ran Out of Food in the Last Year: Never true  Transportation Needs: No Transportation Needs (02/08/2024)   PRAPARE - Administrator, Civil Service (Medical): No    Lack of Transportation (Non-Medical): No  Physical Activity: Not on file  Stress: Not on file  Social Connections: Socially Isolated (02/08/2024)   Social Connection and Isolation Panel [NHANES]    Frequency of Communication with Friends and Family: Twice a week    Frequency of Social Gatherings with Friends and Family: Never     Attends Religious Services: Never    Database administrator or Organizations: No    Attends Banker Meetings: Never    Marital Status: Separated  Intimate Partner Violence: Not At Risk (02/08/2024)   Humiliation, Afraid, Rape, and Kick questionnaire    Fear of Current or Ex-Partner: No    Emotionally Abused: No    Physically Abused: No    Sexually Abused: No     FAMILY HISTORY Family History  Problem Relation Age of Onset   Breast cancer Mother 57   Heart disease Father    Breast cancer Sister 47   Colon cancer Neg Hx    Esophageal cancer Neg Hx    Rectal cancer Neg Hx    Stomach cancer Neg Hx    Thyroid disease Neg Hx      Review of Systems: 12 systems were reviewed and negative except per HPI  Physical Exam: Vitals:   02/08/24 1015 02/08/24 1109  BP: 92/81 115/77  Pulse:  89  Resp: (!) 23 20  Temp:  98 F (36.7 C)  SpO2:  100%   No intake/output data recorded.  Intake/Output Summary (Last 24 hours) at 02/08/2024 1217 Last data filed at 02/07/2024 1508 Gross per 24 hour  Intake 250 ml  Output --  Net 250 ml   General: well-appearing, no acute distress HEENT: anicteric sclera, MMM CV: normal rate, no murmurs, 1+ edema in the bilateral lower extremities Lungs: bilateral chest rise, normal wob Abd: soft, non-tender, non-distended Skin: no visible lesions or rashes Psych: alert, engaged, appropriate mood and affect Neuro: normal speech, no gross focal deficits   Test Results Reviewed Lab Results  Component Value Date   NA 138 02/08/2024   K 3.7 02/08/2024   CL 100 02/08/2024   CO2 26 02/08/2024   BUN 28 (H) 02/08/2024   CREATININE 5.39 (H) 02/08/2024   GFR 63.02 06/06/2015   CALCIUM 8.8 (L) 02/08/2024   ALBUMIN 2.8 (L) 02/07/2024   PHOS 3.4 02/07/2024    I have reviewed relevant outside healthcare records

## 2024-02-08 NOTE — ED Notes (Addendum)
 Pt states that she does not wish to be admitted and would like to go home. Attempted to educate pt regarding reasons for admission, however, pt still states she would like to go home. Message sent to Attending.

## 2024-02-08 NOTE — Assessment & Plan Note (Addendum)
 Likely multifactorial, hypotension related and vasovagal.  Plan to continue blood pressure support with midodrine, she is on 20 mg po tid and fludrocortisone.  Continue telemetry monitoring Fluid removal per HD today.

## 2024-02-08 NOTE — ED Notes (Signed)
 Report given to dialysis RN

## 2024-02-08 NOTE — ED Notes (Signed)
Phlebotomy unable to collect labs.  

## 2024-02-08 NOTE — Assessment & Plan Note (Signed)
 Continue with sertraline and mirtazapine.

## 2024-02-08 NOTE — Progress Notes (Signed)
 Patient brought to 4E from ED. VSS. Telemetry box applied, CCMD notified. Patient oriented to room and staff. Call bell in reach.  Kenard Gower, RN

## 2024-02-08 NOTE — Progress Notes (Incomplete)
 PROGRESS NOTE    Vanessa [name]  ZOX:[id number] DOB: 08-10-1951 DOA: 02/07/2024 PCP: Milus Height, PA  72/F w ESRD on HD, atrial fibrillation, heart failure, sp mitral and tricuspid valve replacement in 10/2023, chronic hypotension on midodrine and Florinef reported dyspnea and orthopnea for one week prior to hospitalization. On the day of admission shortly after starting HD she had a brief syncope episode, with no prodromal symptoms. EMS was called and she was found hypotensive 80/40, she received 250 cc NS IV and was transported to the ED.  In the ED, she was hypoxic, and volume overloaded -Nephrology consulted, dialyzed 3/11   Subjective: Feels okay overall  Assessment and Plan:  AKI/ESRD on dialysis [facility]) -Started dialysis 12/02/2023 -Volume overloaded, extra HD yesterday 3/11, for repeat today -Per nephrology, torsemide 100 Mg on nondialysis days  Syncope Chronic hypotension -Chronic hypotension limiting volume removal on dialysis, also has severe pulmonary hypertension and RV failure -Remains on high-dose midodrine and Florinef, FU ECHO -Activity, PT OT eval  Anemia of chronic renal disease with hgb at 9.6 -Anemia suggestive of iron deficiency and chronic disease -Given IV iron, continue EPO with dialysis   Acute on chronic diastolic CHF  Pulmonary hypertension, RV failure -ECHO 1/25 with EF 35 to 40%, moderately reduced RV, severely elevated PASP 64.8, moderate mitral stenosis, mitral valve replaced, LV with akinetic basal inferior segment, hypokinetic anterior septum, mid infero septal segment, mid inferiotn segment, basal infero septal segment.  -repeat ECHO ordered by admitting MD will FU - ultrafiltration with HD for volume correction  Continue midodrine for blood pressure support.   Severe MR sp mitral clip X2, bicuspid MVR, tricuspid valve ring -Postop course complicated by cardiogenic/septic shock with cardiotomy and prolonged complicated hospital stay -echo  12/2023 with a EF 30-35%, moderately reduced RV, severely elevated PA SP mean mitral valve gradient of 6 mmhg  -FU repeat  Atrial fibrillation (HCC) Continue amiodarone. Anticoagulation with warfarin.   Depression Continue with sertraline and mirtazapine.   Dyslipidemia Continue with statin therapy.    DVT prophylaxis: SCD Code Status: Full Code Family Communication: None present Disposition Plan: Home in 1 to 2 days  Consultants:    Procedures:   Antimicrobials:    Objective: Vitals:   02/08/24 1600 02/08/24 1630 02/08/24 1638 02/08/24 1645  BP: 102/66 106/72 107/73 119/66  Pulse: 75 78 76 74  Resp: (!) 23 (!) 21 18 20   Temp:    97.8 F (36.6 C)  TempSrc:      SpO2: 100% 100% 100% 100%  Weight:      Height:        Intake/Output Summary (Last 24 hours) at 02/08/2024 1708 Last data filed at 02/08/2024 1645 Gross per 24 hour  Intake --  Output 3500 ml  Net -3500 ml   Filed Weights   02/07/24 2002  Weight: 78.5 kg    Examination:  Gen: Awake, Alert, Oriented X 3,  HEENT: + JVD Lungs: Good air movement bilaterally, CTAB CVS: S1S2/RRR Abd: soft, Non tender, non distended, BS present Extremities: 1+ edema Skin: no new rashes on exposed skin    Data Reviewed:   CBC: Recent Labs  Lab 02/07/24 1513 02/07/24 2124 02/08/24 0534  WBC 12.7* 10.5 7.8  NEUTROABS  --  7.6  --   HGB 10.7* 10.2* 9.6*  HCT 35.5* 33.1* 31.1*  MCV 99.4 98.5 98.4  PLT 43* 53* 60*   Basic Metabolic Panel: Recent Labs  Lab 02/07/24 1831 02/07/24 2126 02/08/24 0534 02/08/24 1245  NA 137  --  138  --   K 3.5  --  3.7  --   CL 94*  --  100  --   CO2 27  --  26  --   GLUCOSE 103*  --  99  --   BUN 24*  --  28*  --   CREATININE 4.61*  --  5.39*  --   CALCIUM 9.0  --  8.8*  --   PHOS  --  3.4  --  4.8*   GFR: Estimated Creatinine Clearance: 9.4 mL/min (A) (by C-G formula based on SCr of 5.39 mg/dL (H)). Liver Function Tests: Recent Labs  Lab 02/07/24 2126  AST 38   ALT 37  ALKPHOS 89  BILITOT 3.7*  PROT 6.3*  ALBUMIN 2.8*   No results for input(s): "LIPASE", "AMYLASE" in the last 168 hours. No results for input(s): "AMMONIA" in the last 168 hours. Coagulation Profile: Recent Labs  Lab 02/07/24 2126 02/08/24 0534  INR 2.6* 2.8*   Cardiac Enzymes: No results for input(s): "CKTOTAL", "CKMB", "CKMBINDEX", "TROPONINI" in the last 168 hours. BNP (last 3 results) No results for input(s): "PROBNP" in the last 8760 hours. HbA1C: No results for input(s): "HGBA1C" in the last 72 hours. CBG: No results for input(s): "GLUCAP" in the last 168 hours. Lipid Profile: No results for input(s): "CHOL", "HDL", "LDLCALC", "TRIG", "CHOLHDL", "LDLDIRECT" in the last 72 hours. Thyroid Function Tests: Recent Labs    02/08/24 0534  TSH 1.038   Anemia Panel: Recent Labs    02/08/24 1245  FERRITIN 160  TIBC 298  IRON 27*   Urine analysis:    Component Value Date/Time   COLORURINE YELLOW 11/25/2023 1444   APPEARANCEUR CLEAR 11/25/2023 1444   LABSPEC 1.008 11/25/2023 1444   PHURINE 5.0 11/25/2023 1444   GLUCOSEU 150 (A) 11/25/2023 1444   GLUCOSEU NEGATIVE 01/18/2007 0927   HGBUR NEGATIVE 11/25/2023 1444   BILIRUBINUR NEGATIVE 11/25/2023 1444   KETONESUR NEGATIVE 11/25/2023 1444   PROTEINUR NEGATIVE 11/25/2023 1444   UROBILINOGEN 0.2 mg/dL 16/08/9603 5409   NITRITE NEGATIVE 11/25/2023 1444   LEUKOCYTESUR NEGATIVE 11/25/2023 1444   Sepsis Labs: @LABRCNTIP (procalcitonin:4,lacticidven:4)  )No results found for this or any previous visit (from the past 240 hours).   Radiology Studies: DG Chest 1 View Result Date: 02/07/2024 CLINICAL DATA:  Syncope.  Orthopnea. EXAM: CHEST  1 VIEW COMPARISON:  Chest radiograph 01/15/2024. FINDINGS: Right-sided dialysis catheter in similar position. There is cardiomegaly with vascular congestion and edema, progressed since the prior radiograph. Small left pleural effusion and left lung base atelectasis or infiltrate.  No pneumothorax. Median sternotomy wires. No acute osseous pathology. IMPRESSION: 1. Cardiomegaly with vascular congestion and edema. 2. Small left pleural effusion and left lung base atelectasis or infiltrate. Electronically Signed   By: Elgie Collard M.D.   On: 02/07/2024 19:59     Scheduled Meds:  amiodarone  200 mg Oral Daily   Chlorhexidine Gluconate Cloth  6 each Topical Q0600   fludrocortisone  0.1 mg Oral Daily   midodrine  20 mg Oral TID WC   mirtazapine  7.5 mg Oral QHS   senna-docusate  1 tablet Oral QHS   sertraline  25 mg Oral Daily   sodium chloride flush  3 mL Intravenous Q12H   Continuous Infusions:  iron sucrose       LOS: 1 day    Time spent:    Zannie Cove, MD [facility]   02/08/2024, 5:08 PM

## 2024-02-08 NOTE — Progress Notes (Signed)
 Pt receives out-pt HD at [facility] on MWF 12:10 chair time. Will assist as needed.   Olivia Canter Renal Navigator [phone]

## 2024-02-08 NOTE — Progress Notes (Signed)
   02/08/24 1645  Vitals  Temp 97.8 F (36.6 C)  Pulse Rate 74  Resp 20  BP 119/66  SpO2 100 %  O2 Device Nasal Cannula  Weight  (unable to obtain, pt on stretcher.)  Oxygen Therapy  O2 Flow Rate (L/min) 2 L/min  Patient Activity (if Appropriate) In bed  Pulse Oximetry Type Continuous  Oximetry Probe Site Changed No  Post Treatment  Dialyzer Clearance Clear  Liters Processed 74  Fluid Removed (mL) 3500 mL  Tolerated HD Treatment Yes    Received patient in bed to unit.  Alert and oriented.  Informed consent signed and in chart.   TX duration: Three hours and 30 minutes  Patient tolerated well.  Transported back to the room  Alert, without acute distress.  Hand-off given to patient's nurse.   Access used: Right chest catether Access issues: none

## 2024-02-08 NOTE — Hospital Course (Signed)
 Vanessa [name] was admitted to the hospital with the working diagnosis of syncope.   73 yo female with the past medical history of ESRD on HD, atrial fibrillation, heart failure, sp mitral and tricuspid valve replacement in 10/2023.  Reported dyspnea and orthopnea for one week prior to hospitalization. On the day of admission shortly after starting HD she had a brief syncope episode, with no prodromal symptoms. EMS was called and she was found hypotensive 80/40, she received 250 cc NS IV and was transported to the ED.  In the ED her 02 saturation was in 88% on room air, blood pressure 127/90, HR 88, RR 26.  Lungs with rales bilaterally with no wheezing, heart with S1 and S2 present and regular, abdomen with no distention and positive lower extremity edema Positive JVD.

## 2024-02-08 NOTE — Assessment & Plan Note (Addendum)
 Continue rate control with amiodarone. Anticoagulation with warfarin.

## 2024-02-08 NOTE — Progress Notes (Signed)
 Pt reported to HD from ER with IV SL tubing attached to venous port of HD catheter pt unaware why and does not recall getting medicine through it ER nurse unsure when or why it was attached MD notified no further orders given

## 2024-02-08 NOTE — Progress Notes (Addendum)
 Patient Name: Vanessa [name] Date of Encounter: 02/08/2024 [facility] Cardiologist: None   Interval Summary  .    Frustrated, wants to go home. Still feels short of breath.   Vital Signs .    Vitals:   02/07/24 2100 02/07/24 2130 02/07/24 2147 02/08/24 0727  BP: (!) 130/114 125/78  (!) 92/59  Pulse: 88   77  Resp: (!) 22 (!) 26  12  Temp:   98.2 F (36.8 C) 98.1 F (36.7 C)  TempSrc:   Oral Oral  SpO2: 95%   100%  Weight:      Height:        Intake/Output Summary (Last 24 hours) at 02/08/2024 1014 Last data filed at 02/07/2024 1508 Gross per 24 hour  Intake 250 ml  Output --  Net 250 ml      02/07/2024    8:02 PM 01/29/2024    6:18 AM 01/28/2024    1:10 PM  Last 3 Weights  Weight (lbs) 173 lb 155 lb 10.3 oz 154 lb 15.7 oz  Weight (kg) 78.472 kg 70.6 kg 70.3 kg      Telemetry/ECG    Sinus Rhythm - Personally Reviewed  Physical Exam .    GEN: No acute distress, on [place] @2L    Neck: No JVD Cardiac: RRR, no murmurs, rubs, or gallops.  Respiratory: Diminished bilaterally, coarse crackles GI: Soft, nontender, non-distended  MS: No edema, warm on exam  Assessment & Plan .     73 y.o. female with a hx of CKD, nonischemic cardiomyopathy with EF 40 to 45%, severe MR status post MitraClip x 2 who had recent complex hospital course for mitral valve replacement and tricuspid repair complicated by post op cardiac tamponade with hemopericardium requiring clot removal IntraOp, hemorrhagic shock, ATN now on ESRD, A-fib and hypoxic respiratory failure who is being seen 02/07/2024 for the evaluation of syncope, volume overload at the request of ED.   Acute hypoxic respiratory failure Pulmonary edema --Presented to the ED from dialysis with shortness of breath and syncopal episode.  Reports she has issues with being able to fully complete treatments as things are complicated by hypotension requiring high doses of midodrine. -- Chest x-ray with cardiomegaly, vascular  congestion, small left pleural effusion with atelectasis versus infiltrate -- may need to consider repeat RHC pending response to HD?  ESRD on HD c/b vasoplegia -- Followed by nephrology as noted above dialysis sessions have been complicated by episodes of hypotension requiring high doses of midodrine.  Currently on midodrine 20 mg 3 times daily, Florinef 0.1 mg daily  Severe MR s/p MitraClip failure x2 w/ bicuspid MVR, TV ring (mild to moderate TR) -- Complicated postop course with cardiogenic/septic shock with cardiotomy with prolonged hospitalization recently -- Echo 12/2023 with LVEF of 35 to 40%, moderately reduced/enlarged RV, severe PA pressure of 64 mmHg, moderately dilated left atria, mean mitral valve gradient of 6 mmHg, tricuspid valve gradient of 1.4 mmHg  Pulmonary hypertension -- Right heart cath 12/17/2023 with mild pulmonary artery hypertension  Chronic HFrEF Moderate RV dysfunction --Echo 12/2023 with LVEF of 35 to 40%, moderately reduced/enlarged RV --Volume management per HD -- GDMT: Limited in the setting of hypotension requiring midodrine  Paroxysmal atrial fibrillation -- On amiodarone 200 mg daily as well as Coumadin (PTA) -- INR 2.8  For questions or updates, please contact [facility] Please consult [email or web address] for contact info under        Signed, Laverda Page, NP  ATTENDING ATTESTATION  I have seen, examined and evaluated the patient this evening after completing dialysis having discussed original plan with Laverda Page, NP.  After reviewing all the available data and chart, we discussed the patients laboratory, study & physical findings as well as symptoms in detail.  I agree with her findings, examination as well as impression recommendations as per our discussion.    Attending adjustments noted in italics.   Very tragic situation this unfortunate woman has had a prolonged hospitalization for treatment of mitral and tricuspid valvular  disease eventually leading to open surgical repair which was then complicated by biventricular failure and postop shock etc.  Discharged on midodrine and Florinef to maintain blood pressures and is now on HD with progression of CKD at least CAD 5.if not ESRD.  She is able to go through today in dialysis today may remove at least 3.5 L.  She feels better and is able to breathe easier.  At this point I think she just simply is volume and dialysis per nephrology.  Continue amiodarone for PAF for rate control. INR today is 2.8 on warfarin.  No plans for ongoing treatment.  Would simply continue warfarin.  Will follow along, although not much to offer at this point.Marykay Lex, MD, MS Bryan Lemma, M.D., M.S. Interventional Cardiologist  [facility]  Pager # [phone] Phone # [phone] [address] [place], [place] [address]

## 2024-02-08 NOTE — Progress Notes (Signed)
 Progress Note   [name] QIO:[id number] DOB: Aug 23, 1951 DOA: 02/07/2024     1 DOS: the patient was seen and examined on 02/08/2024   Brief hospital course: Mrs. Geddis was admitted to the hospital with the working diagnosis of syncope.   73 yo female with the past medical history of ESRD on HD, atrial fibrillation, heart failure, sp mitral and tricuspid valve replacement in 10/2023.  Reported dyspnea and orthopnea for one week prior to hospitalization. On the day of admission shortly after starting HD she had a brief syncope episode, with no prodromal symptoms. EMS was called and she was found hypotensive 80/40, she received 250 cc NS IV and was transported to the ED.  In the ED her 02 saturation was in 88% on room air, blood pressure 127/90, HR 88, RR 26.  Lungs with rales bilaterally with no wheezing, heart with S1 and S2 present and regular, abdomen with no distention and positive lower extremity edema Positive JVD.   Assessment and Plan: * ESRD on dialysis [facility]) TTHSat schedule Patient with volume overload.   Her BUN today is 28 and K 3,7 with serum bicarbonate at 26  P 4.8   Plan for HD today with ultrafiltration She may need further inpatient renal replacement therapy and ultrafiltration in order to get her euvolemic.  Close follow up on blood pressure and electrolytes.  Follow up with further nephrology recommendations.   Anemia of chronic renal disease with hgb at 9.6 Iron deficiency with transferrin saturation of 9 and ferritin of 160, with TIBC 271 and iron 27  Chronic thrombocytopenia with plt at 60   Add IV iron infusion.   Syncope Likely multifactorial, hypotension related and vasovagal.  Plan to continue blood pressure support with midodrine, she is on 20 mg po tid and fludrocortisone.  Continue telemetry monitoring Fluid removal per HD today.   Acute on chronic diastolic CHF (congestive heart failure) (HCC) Echocardiogram with reduced LV systolic  function 35 to 40%, RV with moderate reduction in systolic function, severe elevation in pulmonary pressure 64,8 mmHg. LA with moderate dilatation, mitral valve has been replaced, with moderate mitral valve stenosis, tricuspid valve has been repaired, positive mild to moderate tricuspid valve regurgitation, mild aortic valve stenosis.   LV with akinetic basal inferior segment, hypokinetic anterior septum, mid infero septal segment, mid inferiotn segment, basal infero septal segment.   Continue ultrafiltration with HD for volume correction  Continue midodrine for blood pressure support.   Atrial fibrillation (HCC) Continue rate control with amiodarone. Anticoagulation with warfarin.   Idiopathic hypotension Continue midodrine and fludrocortisone   Depression Continue with sertraline and mirtazapine.   Dyslipidemia Continue with statin therapy.         Subjective: Patient continue with significant dyspnea and edema, yesterday did not complete HD treatment due to syncope. Today no further syncope.   Physical Exam: Vitals:   02/08/24 1400 02/08/24 1430 02/08/24 1500 02/08/24 1530  BP: 113/76 110/69 115/70 95/70  Pulse: 78 80 78 76  Resp: 17 16 (!) 26 15  Temp:      TempSrc:      SpO2: 100% 100% 100% 100%  Weight:      Height:       Neurology awake and alert ENT with mild pallor Cardiovascular with S1 and S2 present and regular with no gallops, rubs or murmurs Positive JVD Positive lower extremity edema ++  Respiratory with bilateral rales with no wheezing or rhonchi  Abdomen with no distention  Data  Reviewed:    Family Communication: no family at the bedside   Disposition: Status is: Inpatient Remains inpatient appropriate because: inpatient renal replacement therapy and ultrafiltration   Planned Discharge Destination: Home     Author: Coralie Keens, MD 02/08/2024 3:51 PM  For on call review [email or web address].

## 2024-02-08 NOTE — Assessment & Plan Note (Addendum)
Continue midodrine and fludrocortisone 

## 2024-02-08 NOTE — Assessment & Plan Note (Signed)
 Continue with statin therapy.  ?

## 2024-02-09 ENCOUNTER — Inpatient Hospital Stay (HOSPITAL_COMMUNITY)

## 2024-02-09 DIAGNOSIS — Z992 Dependence on renal dialysis: Secondary | ICD-10-CM | POA: Diagnosis not present

## 2024-02-09 DIAGNOSIS — R55 Syncope and collapse: Secondary | ICD-10-CM

## 2024-02-09 DIAGNOSIS — N186 End stage renal disease: Secondary | ICD-10-CM | POA: Diagnosis not present

## 2024-02-09 LAB — ECHOCARDIOGRAM COMPLETE
AR max vel: 1.23 cm2
AV Area VTI: 1.22 cm2
AV Area mean vel: 1.17 cm2
AV Mean grad: 9 mmHg
AV Peak grad: 16 mmHg
Ao pk vel: 2 m/s
Height: 63 in
MV VTI: 0.73 cm2
S' Lateral: 3.9 cm
Weight: 2537.6 [oz_av]

## 2024-02-09 LAB — GLUCOSE, CAPILLARY
Glucose-Capillary: 106 mg/dL — ABNORMAL HIGH (ref 70–99)
Glucose-Capillary: 93 mg/dL (ref 70–99)

## 2024-02-09 LAB — BASIC METABOLIC PANEL
Anion gap: 12 (ref 5–15)
BUN: 36 mg/dL — ABNORMAL HIGH (ref 8–23)
CO2: 25 mmol/L (ref 22–32)
Calcium: 8.7 mg/dL — ABNORMAL LOW (ref 8.9–10.3)
Chloride: 100 mmol/L (ref 98–111)
Creatinine, Ser: 6.35 mg/dL — ABNORMAL HIGH (ref 0.44–1.00)
GFR, Estimated: 7 mL/min — ABNORMAL LOW (ref >60)
Glucose, Bld: 104 mg/dL — ABNORMAL HIGH (ref 70–99)
Potassium: 3.8 mmol/L (ref 3.5–5.1)
Sodium: 137 mmol/L (ref 135–145)

## 2024-02-09 LAB — CBC
HCT: 30.8 % — ABNORMAL LOW (ref 36.0–46.0)
Hemoglobin: 9.4 g/dL — ABNORMAL LOW (ref 12.0–15.0)
MCH: 30 pg (ref 26.0–34.0)
MCHC: 30.5 g/dL (ref 30.0–36.0)
MCV: 98.4 fL (ref 80.0–100.0)
Platelets: 82 10*3/uL — ABNORMAL LOW (ref 150–400)
RBC: 3.13 MIL/uL — ABNORMAL LOW (ref 3.87–5.11)
RDW: 16.9 % — ABNORMAL HIGH (ref 11.5–15.5)
WBC: 9.6 10*3/uL (ref 4.0–10.5)
nRBC: 0 % (ref 0.0–0.2)

## 2024-02-09 LAB — PROTIME-INR
INR: 2.9 — ABNORMAL HIGH (ref 0.8–1.2)
Prothrombin Time: 30.2 s — ABNORMAL HIGH (ref 11.4–15.2)

## 2024-02-09 LAB — HEPATITIS B SURFACE ANTIBODY, QUANTITATIVE: Hep B S AB Quant (Post): <3.5 m[IU]/mL — ABNORMAL LOW

## 2024-02-09 LAB — HCV INTERPRETATION

## 2024-02-09 LAB — HCV AB W REFLEX TO QUANT PCR: HCV Ab: NONREACTIVE

## 2024-02-09 MED ORDER — ALBUMIN HUMAN 25 % IV SOLN
25.0000 g | Freq: Once | INTRAVENOUS | Status: AC
  Administered 2024-02-09: 25 g via INTRAVENOUS

## 2024-02-09 MED ORDER — POLYETHYLENE GLYCOL 3350 17 G PO PACK
17.0000 g | PACK | Freq: Every day | ORAL | Status: DC
  Administered 2024-02-09 – 2024-02-10 (×2): 17 g via ORAL
  Filled 2024-02-09 (×2): qty 1

## 2024-02-09 MED ORDER — HEPARIN SODIUM (PORCINE) 1000 UNIT/ML IJ SOLN
INTRAMUSCULAR | Status: AC
  Administered 2024-02-09: 1000 [IU]
  Filled 2024-02-09: qty 4

## 2024-02-09 MED ORDER — WARFARIN - PHARMACIST DOSING INPATIENT: Freq: Every day | Status: DC

## 2024-02-09 MED ORDER — PERFLUTREN LIPID MICROSPHERE
1.0000 mL | INTRAVENOUS | Status: AC | PRN
  Administered 2024-02-09: 2 mL via INTRAVENOUS

## 2024-02-09 NOTE — Progress Notes (Signed)
 Mobility Specialist Progress Note:    02/09/24 0941  Mobility  Activity Ambulated with assistance to bathroom  Level of Assistance Moderate assist, patient does 50-74%  Assistive Device Front wheel walker  Distance Ambulated (ft) 20 ft  Activity Response Tolerated well  Mobility Referral Yes  Mobility visit 1 Mobility  Mobility Specialist Start Time (ACUTE ONLY) 0941  Mobility Specialist Stop Time (ACUTE ONLY) 0959  Mobility Specialist Time Calculation (min) (ACUTE ONLY) 18 min   Pt received in bathroom, c/o constipation. RN notified. Agreeable to ambulate back to bed. ModA to stand from commode, SB during ambulation. Tolerated well, asx throughout session. Lying comfortably in bed, alarm on, all needs met.    Feliciana Rossetti Mobility Specialist Please contact via Special educational needs teacher or  Rehab office at [phone]

## 2024-02-09 NOTE — Progress Notes (Signed)
 Nephrology Follow-Up Consult note   Outpatient dialysis unit: [facility] Outpatient dialysis prescription:  F180, BFR 350, 3K, 2.5 Ca, EDW 68.6, 4hr, TDC, no esa or Vit D   Assessment/Recommendations: Vanessa [name] is a/an 73 y.o. female with a past medical history notable for ESRD on HD admitted with hypotension.    # Dialysis dependent AKI: Started dialysis on 12/02/2023.  Considered AKI but likely nearing ESRD status.  Underwent dry ultrafiltration on 3/11.  Plan for dialysis today to maintain her schedule.   # Volume/ hypertension: Significantly volume overloaded.  Ultrafiltration limited by hypotension.  Continue home midodrine.  Continue duration as tolerated.  Would discharge on torsemide 100 mg on nondialysis days   # Anemia of Chronic Kidney Disease: Hemoglobin 9.4 not currently receiving ESA in the outpatient setting.  Giving IV iron here   # Secondary Hyperparathyroidism/Hyperphosphatemia: Calcium acceptable not on vitamin D.  Phosphorus also at goal   # Vascular access: [facility] with no issues   # Additional recommendations: - Dose all meds for creatinine clearance < 10 ml/min  - Unless absolutely necessary, no MRIs with gadolinium.  - Implement save arm precautions.  Prefer needle sticks in the dorsum of the hands or wrists.  No blood pressure measurements in arm. - If blood transfusion is requested during hemodialysis sessions, please alert [place] prior to the session.  - Use synthetic opioids (Fentanyl/Dilaudid) if needed   Recommendations conveyed to primary service.    Darnell Level [facility] 02/09/2024 10:55 AM  ___________________________________________________________  CC: Shortness of breath  Interval History/Subjective: Patient says dialysis went well yesterday.  Feels much better.  Denies any complaints.  Wants to go home   Medications:  Current Facility-Administered Medications  Medication Dose Route Frequency Provider Last Rate Last Admin    acetaminophen (TYLENOL) tablet 650 mg  650 mg Oral Q6H PRN Opyd, Lavone Neri, MD       Or   acetaminophen (TYLENOL) suppository 650 mg  650 mg Rectal Q6H PRN Opyd, Lavone Neri, MD       amiodarone (PACERONE) tablet 200 mg  200 mg Oral Daily Opyd, Lavone Neri, MD   200 mg at 02/09/24 0936   Chlorhexidine Gluconate Cloth 2 % PADS 6 each  6 each Topical Q0600 Darnell Level, MD       fludrocortisone (FLORINEF) tablet 0.1 mg  0.1 mg Oral Daily Opyd, Lavone Neri, MD   0.1 mg at 02/09/24 0936   iron sucrose (VENOFER) 500 mg in sodium chloride 0.9 % 250 mL IVPB  500 mg Intravenous Q24H Darnell Level, MD   Stopped at 02/09/24 0002   midodrine (PROAMATINE) tablet 20 mg  20 mg Oral TID WC Opyd, Lavone Neri, MD   20 mg at 02/09/24 0851   mirtazapine (REMERON) tablet 7.5 mg  7.5 mg Oral QHS Opyd, Lavone Neri, MD   7.5 mg at 02/08/24 2202   perflutren lipid microspheres (DEFINITY) IV suspension  1-10 mL Intravenous PRN Opyd, Lavone Neri, MD   2 mL at 02/09/24 1048   senna-docusate (Senokot-S) tablet 1 tablet  1 tablet Oral QHS Opyd, Lavone Neri, MD   1 tablet at 02/08/24 2202   sertraline (ZOLOFT) tablet 25 mg  25 mg Oral Daily Opyd, Lavone Neri, MD   25 mg at 02/09/24 0936   sodium chloride flush (NS) 0.9 % injection 3 mL  3 mL Intravenous Q12H Briscoe Deutscher, MD   3 mL at 02/09/24 6578      Review  of Systems: 10 systems reviewed and negative except per interval history/subjective  Physical Exam: Vitals:   02/09/24 0617 02/09/24 0816  BP:  (!) 82/48  Pulse: 79 78  Resp: 16 20  Temp:  98.8 F (37.1 C)  SpO2:  98%   No intake/output data recorded.  Intake/Output Summary (Last 24 hours) at 02/09/2024 1055 Last data filed at 02/09/2024 0700 Gross per 24 hour  Intake 370 ml  Output 3500 ml  Net -3130 ml   Constitutional: [place] ill-appearing, lying in bed, no distress ENMT: ears and nose without scars or lesions, MMM CV: normal rate, trace edema in the lower extremities Respiratory: Bilateral chest rise,  normal work of breathing Gastrointestinal: soft, non-tender, no palpable masses or hernias Skin: no visible lesions or rashes Psych: alert, judgement/insight appropriate, appropriate mood and affect with intermittent anger   Test Results I personally reviewed new and old clinical labs and radiology tests Lab Results  Component Value Date   NA 137 02/09/2024   K 3.8 02/09/2024   CL 100 02/09/2024   CO2 25 02/09/2024   BUN 36 (H) 02/09/2024   CREATININE 6.35 (H) 02/09/2024   GFR 63.02 06/06/2015   CALCIUM 8.7 (L) 02/09/2024   ALBUMIN 2.8 (L) 02/07/2024   PHOS 4.8 (H) 02/08/2024    CBC Recent Labs  Lab 02/07/24 2124 02/08/24 0534 02/09/24 0310  WBC 10.5 7.8 9.6  NEUTROABS 7.6  --   --   HGB 10.2* 9.6* 9.4*  HCT 33.1* 31.1* 30.8*  MCV 98.5 98.4 98.4  PLT 53* 60* 82*

## 2024-02-09 NOTE — Progress Notes (Signed)
 PHARMACY - ANTICOAGULATION CONSULT NOTE  Pharmacy Consult for warfarin Indication: atrial fibrillation  Allergies  Allergen Reactions   Spironolactone     hyperkalemia    Patient Measurements: Height: 5\' 3"  (160 cm) Weight: 70.4 kg (155 lb 3.3 oz) IBW/kg (Calculated) : 52.4  Vital Signs: Temp: 97.8 F (36.6 C) (03/12 1347) Temp Source: Oral (03/12 0816) BP: 110/67 (03/12 1400) Pulse Rate: 83 (03/12 1400)  Labs: Recent Labs    02/07/24 1643 02/07/24 1831 02/07/24 2124 02/07/24 2126 02/08/24 0534 02/09/24 0310  HGB  --   --  10.2*  --  9.6* 9.4*  HCT  --   --  33.1*  --  31.1* 30.8*  PLT  --   --  53*  --  60* 82*  LABPROT  --   --   --  28.5* 30.0* 30.2*  INR  --   --   --  2.6* 2.8* 2.9*  CREATININE  --  4.61*  --   --  5.39* 6.35*  TROPONINIHS 21*  --   --   --   --   --     Estimated Creatinine Clearance: 7.5 mL/min (A) (by C-G formula based on SCr of 6.35 mg/dL (H)).   Medical History: Past Medical History:  Diagnosis Date   CHF (congestive heart failure) (HCC)    CKD (chronic kidney disease)    Heart murmur    Hypertension    Moderate mitral regurgitation    moderate to severe MR with moderate pulmonary HTN   Multinodular goiter    Nonischemic cardiomyopathy (HCC)    EF 30-35% by echo 2015   Pulmonary HTN (HCC)    moderate with PASP by echo 2015   PVC's (premature ventricular contractions)     Medications:  Scheduled:   amiodarone  200 mg Oral Daily   Chlorhexidine Gluconate Cloth  6 each Topical Q0600   fludrocortisone  0.1 mg Oral Daily   midodrine  20 mg Oral TID WC   mirtazapine  7.5 mg Oral QHS   polyethylene glycol  17 g Oral Daily   senna-docusate  1 tablet Oral QHS   sertraline  25 mg Oral Daily   sodium chloride flush  3 mL Intravenous Q12H   Infusions:   albumin human     iron sucrose Stopped (02/09/24 0002)    Assessment: 73 yo female with PMH of ESRD on HD, afib, HF, and s/p mitral and tricuspid valve replacement in  10/2023. Pharmacy consulted to dose warfarin in setting of afib.   Home dose is 5 mg daily per last anticoag visit note on 3/4. Last dose of warfarin was on 3/9, was being worked up for thrombocytopenia on admission. Of note patient is on amiodarone which can interact with warfarin and increase INR/bleeding.  INR today is 2.9 and trended up from 2.6 on admission despite missing 2 doses of warfarin. Hgb 9-10s, plt trending up from 43 > 82.   Goal of Therapy:  INR 2-3 Monitor platelets by anticoagulation protocol: Yes   Plan:  HOLD warfarin today in setting of INR increase despite missing warfarin doses Monitor CBC and for s/sx of bleeding   Damir Leung I Shuna Tabor 02/09/2024,2:31 PM

## 2024-02-09 NOTE — Progress Notes (Signed)
   02/09/24 1745  Vitals  Temp 97.8 F (36.6 C)  Pulse Rate 82  Resp (!) 21  BP (!) 88/55  SpO2 98 %  O2 Device Room Air  Weight 68 kg  Type of Weight Post-Dialysis  Post Treatment  Dialyzer Clearance Clear  Hemodialysis Intake (mL) 0 mL  Liters Processed 75.7  Fluid Removed (mL) 2500 mL  Tolerated HD Treatment Yes  Post-Hemodialysis Comments tx terminated 21 mins early due to machine shut down   Received patient in bed to unit.  Alert and oriented.  Informed consent signed and in chart.   TX duration:3.09hrs  Patient tolerated well.  Transported back to the room  Alert, without acute distress.  Hand-off given to patient's nurse.   Access used: [employer] Access issues: none  Total UF removed: 2.5L Medication(s) given: albumin    Na'Shaminy T Aal[facility]

## 2024-02-09 NOTE — Progress Notes (Signed)
 Echocardiogram 2D Echocardiogram has been performed.  Warren Lacy Lorey Pallett RDCS 02/09/2024, 10:49 AM

## 2024-02-09 NOTE — Plan of Care (Signed)
  Problem: Education: Goal: Knowledge of condition and prescribed therapy will improve Outcome: Progressing   Problem: Cardiac: Goal: Will achieve and/or maintain adequate cardiac output Outcome: Progressing   Problem: Physical Regulation: Goal: Complications related to the disease process, condition or treatment will be avoided or minimized Outcome: Progressing   Problem: Education: Goal: Knowledge of General Education information will improve Description: Including pain rating scale, medication(s)/side effects and non-pharmacologic comfort measures Outcome: Progressing   Problem: Health Behavior/Discharge Planning: Goal: Ability to manage health-related needs will improve Outcome: Progressing   Problem: Clinical Measurements: Goal: Ability to maintain clinical measurements within normal limits will improve Outcome: Progressing

## 2024-02-10 ENCOUNTER — Ambulatory Visit

## 2024-02-10 ENCOUNTER — Other Ambulatory Visit (HOSPITAL_COMMUNITY): Payer: Self-pay

## 2024-02-10 DIAGNOSIS — I5033 Acute on chronic diastolic (congestive) heart failure: Secondary | ICD-10-CM | POA: Diagnosis not present

## 2024-02-10 DIAGNOSIS — N186 End stage renal disease: Secondary | ICD-10-CM | POA: Diagnosis not present

## 2024-02-10 DIAGNOSIS — R55 Syncope and collapse: Secondary | ICD-10-CM | POA: Diagnosis not present

## 2024-02-10 DIAGNOSIS — I4891 Unspecified atrial fibrillation: Secondary | ICD-10-CM | POA: Diagnosis not present

## 2024-02-10 LAB — BASIC METABOLIC PANEL
Anion gap: 10 (ref 5–15)
BUN: 17 mg/dL (ref 8–23)
CO2: 26 mmol/L (ref 22–32)
Calcium: 9.1 mg/dL (ref 8.9–10.3)
Chloride: 97 mmol/L — ABNORMAL LOW (ref 98–111)
Creatinine, Ser: 3.39 mg/dL — ABNORMAL HIGH (ref 0.44–1.00)
GFR, Estimated: 14 mL/min — ABNORMAL LOW (ref >60)
Glucose, Bld: 110 mg/dL — ABNORMAL HIGH (ref 70–99)
Potassium: 3.6 mmol/L (ref 3.5–5.1)
Sodium: 133 mmol/L — ABNORMAL LOW (ref 135–145)

## 2024-02-10 LAB — GLUCOSE, CAPILLARY: Glucose-Capillary: 108 mg/dL — ABNORMAL HIGH (ref 70–99)

## 2024-02-10 LAB — CBC
HCT: 30.6 % — ABNORMAL LOW (ref 36.0–46.0)
Hemoglobin: 9.5 g/dL — ABNORMAL LOW (ref 12.0–15.0)
MCH: 30.4 pg (ref 26.0–34.0)
MCHC: 31 g/dL (ref 30.0–36.0)
MCV: 97.8 fL (ref 80.0–100.0)
Platelets: 107 10*3/uL — ABNORMAL LOW (ref 150–400)
RBC: 3.13 MIL/uL — ABNORMAL LOW (ref 3.87–5.11)
RDW: 16.8 % — ABNORMAL HIGH (ref 11.5–15.5)
WBC: 9.3 10*3/uL (ref 4.0–10.5)
nRBC: 0 % (ref 0.0–0.2)

## 2024-02-10 LAB — PROTIME-INR
INR: 2.3 — ABNORMAL HIGH (ref 0.8–1.2)
Prothrombin Time: 25.5 s — ABNORMAL HIGH (ref 11.4–15.2)

## 2024-02-10 MED ORDER — WARFARIN SODIUM 1 MG PO TABS
1.0000 mg | ORAL_TABLET | Freq: Once | ORAL | Status: AC
  Administered 2024-02-10: 1 mg via ORAL
  Filled 2024-02-10: qty 1

## 2024-02-10 MED ORDER — TORSEMIDE 100 MG PO TABS
100.0000 mg | ORAL_TABLET | ORAL | 1 refills | Status: AC
  Filled 2024-02-10: qty 20, 47d supply, fill #0

## 2024-02-10 NOTE — Evaluation (Signed)
 Physical Therapy Evaluation Patient Details Name: Vanessa [name] MRN: [id number] DOB: July 08, 1951 Today's Date: 02/10/2024  History of Present Illness  73 y.o. female who now presents from her dialysis center after syncopal episode. +pulmonary edema, CHF 2/2 cardiomyopathy,  PMH significant for ESRD, atrial fibrillation on warfarin, chronic HFrEF, and mitral valve replacement and tricuspid ring on 11/29/2023 complicated by tamponade and shock  Clinical Impression   Pt admitted secondary to problem above with deficits below. PTA patient was living alone and using a RW for ambulation. Reports her sister sometimes had to help her stand from her lower furniture. Pt currently requires min assist for transfers (bed at lowest height) and unable to progress to ambulation due to symptomatic orthostasis. Pt reported feeling overall "weak" when BP down to 74/57 in standing. Patient demonstrated sufficient balance and strength for ambulation with RW and if BP stabilizes, anticipate she can return home with HHPT. If BP continues to be an issue, would have to recommend inpatient rehab with <3 hours of therapy/day and very doubtful that pt will agree.  Anticipate patient will benefit from PT to address problems listed below.Will continue to follow acutely to maximize functional mobility independence and safety.           If plan is discharge home, recommend the following: A little help with walking and/or transfers   Can travel by private vehicle        Equipment Recommendations None recommended by PT  Recommendations for Other Services  OT consult    Functional Status Assessment Patient has had a recent decline in their functional status and demonstrates the ability to make significant improvements in function in a reasonable and predictable amount of time.     Precautions / Restrictions Precautions Precautions: Fall Precaution/Restrictions Comments: monitor BP Restrictions Weight Bearing  Restrictions Per Provider Order: No      Mobility  Bed Mobility Overal bed mobility: Modified Independent             General bed mobility comments: incr time and effort; no use of rails    Transfers Overall transfer level: Needs assistance Equipment used: Rolling walker (2 wheels) Transfers: Sit to/from Stand, Bed to chair/wheelchair/BSC Sit to Stand: Min assist   Step pivot transfers: Min assist       General transfer comment: stood from EOB x 2    Ambulation/Gait               General Gait Details: unable due to symptomatic hypotension  Stairs            Wheelchair Mobility     Tilt Bed    Modified Rankin (Stroke Patients Only)       Balance Overall balance assessment: Needs assistance Sitting-balance support: No upper extremity supported, Feet unsupported Sitting balance-Leahy Scale: Good     Standing balance support: Bilateral upper extremity supported, Reliant on assistive device for balance Standing balance-Leahy Scale: Poor                               Pertinent Vitals/Pain Pain Assessment Pain Assessment: No/denies pain    Home Living Family/patient expects to be discharged to:: Private residence Living Arrangements: Alone Available Help at Discharge: Family;Available 24 hours/day Type of Home: House Home Access: Level entry       Home Layout: One level Home Equipment: BSC/3in1;Rolling Walker (2 wheels);Wheelchair - manual      Prior Function Prior Level of Function :  Independent/Modified Independent;Driving             Mobility Comments: using RW ADLs Comments: doesn't get in shower; sponge bathes due to HD catheter     Extremity/Trunk Assessment   Upper Extremity Assessment Upper Extremity Assessment: Generalized weakness    Lower Extremity Assessment Lower Extremity Assessment: Generalized weakness    Cervical / Trunk Assessment Cervical / Trunk Assessment: Normal  Communication    Communication Communication: No apparent difficulties    Cognition Arousal: Alert Behavior During Therapy: Flat affect   PT - Cognitive impairments: No apparent impairments                         Following commands: Intact       Cueing Cueing Techniques: Verbal cues     General Comments General comments (skin integrity, edema, etc.): see vitals flowsheet for orthostatic BPs    Exercises     Assessment/Plan    PT Assessment Patient needs continued PT services  PT Problem List Decreased strength;Decreased activity tolerance;Decreased balance;Decreased mobility;Cardiopulmonary status limiting activity       PT Treatment Interventions DME instruction;Gait training;Functional mobility training;Therapeutic activities;Therapeutic exercise;Balance training;Patient/family education    PT Goals (Current goals can be found in the Care Plan section)  Acute Rehab PT Goals Patient Stated Goal: go home today PT Goal Formulation: With patient Time For Goal Achievement: 02/24/24 Potential to Achieve Goals: Fair    Frequency Min 2X/week     Co-evaluation               AM-PAC PT "6 Clicks" Mobility  Outcome Measure Help needed turning from your back to your side while in a flat bed without using bedrails?: None Help needed moving from lying on your back to sitting on the side of a flat bed without using bedrails?: None Help needed moving to and from a bed to a chair (including a wheelchair)?: A Little Help needed standing up from a chair using your arms (e.g., wheelchair or bedside chair)?: A Little Help needed to walk in hospital room?: Total Help needed climbing 3-5 steps with a railing? : Total 6 Click Score: 16    End of Session   Activity Tolerance: Treatment limited secondary to medical complications (Comment) (orthostasis and "feeling weak") Patient left: in chair;with call bell/phone within reach Nurse Communication: Mobility status;Other (comment)  (BPs) PT Visit Diagnosis: Difficulty in walking, not elsewhere classified (R26.2)    Time: 2952-8413 PT Time Calculation (min) (ACUTE ONLY): 26 min   Charges:   PT Evaluation $PT Eval Low Complexity: 1 Low PT Treatments $Therapeutic Activity: 8-22 mins PT General Charges $$ ACUTE PT VISIT: 1 Visit          Jerolyn Center, PT Acute Rehabilitation Services  Office [phone]   Zena Amos 02/10/2024, 9:03 AM

## 2024-02-10 NOTE — Care Management (Addendum)
 Transition of Care [facility]) - Inpatient Brief Assessment   Patient Details  Name: Vanessa [name] MRN: [id number] Date of Birth: 18-May-1951  Transition of [facility] CM/SW Contact:    Lockie Pares, RN Phone Number: 02/10/2024, 4:55 PM   Clinical Narrative: 73 yo presented with [facility] fluid overload. She is active with [facility] for home health services, verified that she receives PT and OT . Will continue to these services on discharge.  TOC will follow for needs, recommendations and transitions of care.  1715 Patient wants ot be DC but she is very orthostatic Nursing s/w MD  Transition of Care Asessment: Insurance and Status: Insurance coverage has been reviewed Patient has primary care physician: Yes   Prior level of function:: Had Home health Prior/Current Home Services: Current home services Social Drivers of Health Review: SDOH reviewed no interventions necessary Readmission risk has been reviewed: Yes Transition of care needs: transition of care needs identified, TOC will continue to follow

## 2024-02-10 NOTE — Progress Notes (Addendum)
 Pt was adamant to go home. Her BP dropped while standing earlier with PT. Pt kept asking to go home, her bp check , after walking was in 80's Systolic. Tried one more time per MD request, while standing it dropped to 83/53, and after walking 57/45. See flow sheet for BP documentation. DR. Jomarie Longs notified. Pt was asymptomatic the whole time, denied any dizziness, lightheadedness. Put patient back in bed. BP after laying  in bed was 95/72. Plan of care continues.

## 2024-02-10 NOTE — Progress Notes (Signed)
 Nephrology Follow-Up Consult note   Outpatient dialysis unit: [facility] Outpatient dialysis prescription:  F180, BFR 350, 3K, 2.5 Ca, EDW 68.6, 4hr, TDC, no esa or Vit D   Assessment/Recommendations: Vanessa [name] is a/an 73 y.o. female with a past medical history notable for ESRD on HD admitted with hypotension.    # Dialysis dependent AKI: Started dialysis on 12/02/2023.  Considered AKI but likely nearing ESRD status.  Underwent dry ultrafiltration on 3/11 as well as dialysis on 3/12 with significant volume removal.  Plan to maintain MWF schedule if she remains inpatient   # Volume/ hypertension: Significantly volume overloaded.  Ultrafiltration limited by hypotension in the outpatient setting.  Good volume removal while here.  Would discharge on torsemide 100 mg daily.  Volume removal with dialysis as able.   # Anemia of Chronic Kidney Disease: Hemoglobin 9.5 not currently receiving ESA in the outpatient setting.  Received the iron here  # Secondary Hyperparathyroidism/Hyperphosphatemia: Calcium acceptable not on vitamin D.  Phosphorus also at goal   # Vascular access: [facility] with no issues   # Additional recommendations: - Dose all meds for creatinine clearance < 10 ml/min  - Unless absolutely necessary, no MRIs with gadolinium.  - Implement save arm precautions.  Prefer needle sticks in the dorsum of the hands or wrists.  No blood pressure measurements in arm. - If blood transfusion is requested during hemodialysis sessions, please alert [place] prior to the session.  - Use synthetic opioids (Fentanyl/Dilaudid) if needed   Recommendations conveyed to primary service.    Darnell Level [facility] 02/10/2024 9:10 AM  ___________________________________________________________  CC: Shortness of breath  Interval History/Subjective: Patient tolerate dialysis yesterday with 2.5 L removed.  States that she feels great and wants to go home.   Medications:  Current  Facility-Administered Medications  Medication Dose Route Frequency Provider Last Rate Last Admin   acetaminophen (TYLENOL) tablet 650 mg  650 mg Oral Q6H PRN Opyd, Lavone Neri, MD       Or   acetaminophen (TYLENOL) suppository 650 mg  650 mg Rectal Q6H PRN Opyd, Lavone Neri, MD       amiodarone (PACERONE) tablet 200 mg  200 mg Oral Daily Opyd, Lavone Neri, MD   200 mg at 02/09/24 0936   Chlorhexidine Gluconate Cloth 2 % PADS 6 each  6 each Topical Q0600 Darnell Level, MD       fludrocortisone (FLORINEF) tablet 0.1 mg  0.1 mg Oral Daily Opyd, Lavone Neri, MD   0.1 mg at 02/09/24 0936   midodrine (PROAMATINE) tablet 20 mg  20 mg Oral TID WC Opyd, Lavone Neri, MD   20 mg at 02/10/24 0806   mirtazapine (REMERON) tablet 7.5 mg  7.5 mg Oral QHS Opyd, Lavone Neri, MD   7.5 mg at 02/09/24 2138   polyethylene glycol (MIRALAX / GLYCOLAX) packet 17 g  17 g Oral Daily Zannie Cove, MD   17 g at 02/10/24 9604   senna-docusate (Senokot-S) tablet 1 tablet  1 tablet Oral QHS Briscoe Deutscher, MD   1 tablet at 02/09/24 2138   sertraline (ZOLOFT) tablet 25 mg  25 mg Oral Daily Opyd, Lavone Neri, MD   25 mg at 02/10/24 0805   sodium chloride flush (NS) 0.9 % injection 3 mL  3 mL Intravenous Q12H Briscoe Deutscher, MD   3 mL at 02/10/24 5409   warfarin (COUMADIN) tablet 1 mg  1 mg Oral ONCE-1600 Silvana Newness, [facility]  Warfarin - Pharmacist Dosing Inpatient   Does not apply q1600 Theotis Burrow I, RPH          Review of Systems: 10 systems reviewed and negative except per interval history/subjective  Physical Exam: Vitals:   02/10/24 0801 02/10/24 0854  BP: (!) 91/56 (!) 89/65  Pulse: 80 87  Resp: 19   Temp: 98.6 F (37 C)   SpO2: 93%    No intake/output data recorded.  Intake/Output Summary (Last 24 hours) at 02/10/2024 0910 Last data filed at 02/09/2024 2022 Gross per 24 hour  Intake --  Output 2500 ml  Net -2500 ml   Constitutional: Lying in bed in no distress ENMT: ears and nose without scars  or lesions, MMM CV: normal rate, trace edema in the lower extremities Respiratory: Bilateral chest rise, normal work of breathing Gastrointestinal: soft, non-tender, no palpable masses or hernias Skin: no visible lesions or rashes Psych: alert, judgement/insight appropriate, appropriate mood and affect with intermittent anger   Test Results I personally reviewed new and old clinical labs and radiology tests Lab Results  Component Value Date   NA 133 (L) 02/10/2024   K 3.6 02/10/2024   CL 97 (L) 02/10/2024   CO2 26 02/10/2024   BUN 17 02/10/2024   CREATININE 3.39 (H) 02/10/2024   GFR 63.02 06/06/2015   CALCIUM 9.1 02/10/2024   ALBUMIN 2.8 (L) 02/07/2024   PHOS 4.8 (H) 02/08/2024    CBC Recent Labs  Lab 02/07/24 2124 02/08/24 0534 02/09/24 0310 02/10/24 0232  WBC 10.5 7.8 9.6 9.3  NEUTROABS 7.6  --   --   --   HGB 10.2* 9.6* 9.4* 9.5*  HCT 33.1* 31.1* 30.8* 30.6*  MCV 98.5 98.4 98.4 97.8  PLT 53* 60* 82* 107*

## 2024-02-10 NOTE — Discharge Summary (Addendum)
 Physician Discharge Summary  Vanessa [name] JXB:[id number] DOB: 06-Sep-1951 DOA: 02/07/2024  PCP: Milus Height, PA  Admit date: 02/07/2024 Discharge date: 02/10/2024  Time spent: 45 minutes  Recommendations for Outpatient Follow-up:  Cardiology Dr.McLean in 60month, needs repeat ECHO to re-evaluate Mitral Valve Outpatient palliative care   Discharge Diagnoses:  Principal Problem:   ESRD on dialysis [facility]) Chronic hypotension Severe valvular heart disease  Syncope   Acute on chronic diastolic CHF (congestive heart failure) (HCC)   Atrial fibrillation (HCC)   Idiopathic hypotension   Depression   Dyslipidemia   Hypoxia   Valvular cardiomyopathy (HCC)   Discharge Condition: fair  Diet recommendation: Renal  Filed Weights   02/09/24 1347 02/09/24 1745 02/10/24 0623  Weight: 70.4 kg 68 kg 70.9 kg    History of present illness:  73/F w ESRD on HD, atrial fibrillation, heart failure, sp mitral and tricuspid valve replacement in 10/2023, chronic hypotension on midodrine and Florinef reported dyspnea and orthopnea for one week prior to hospitalization. On the day of admission shortly after starting HD she had a brief syncope episode, with no prodromal symptoms. EMS was called and she was found hypotensive 80/40, she received 250 cc NS IV and was transported to the ED.  In the ED, she was hypoxic, and volume overloaded -Nephrology consulted, dialyzed 3/11  Hospital Course:   AKI/ESRD on dialysis [facility]) -Started dialysis 12/02/2023 -Volume overloaded on admission, now improving, extra HD yesterday 3/11, followed by repeat 3/12 -Per nephrology, torsemide 100 Mg on nondialysis days -Hemodialysis continues to be complicated by intermittent hypotension -Patient is not interested in palliative care, unfortunately prognosis is poor   Syncope Chronic hypotension -Chronic hypotension limiting volume removal on dialysis, also has severe pulmonary hypertension and RV failure -Remains on  high-dose midodrine and Florinef, echo as noted below -Activity, PT OT eval> eval completed -I worry she is getting to the point where she will not tolerate dialysis for too long but she is not amenable to palliative discussions at this time   Acute on chronic Systolic CHF  Pulmonary hypertension, RV failure -ECHO 1/25 with EF 35 to 40%, moderately reduced RV, severely elevated PASP 64.8, moderate mitral stenosis, mitral valve replaced, LV with akinetic basal inferior segment, hypokinetic anterior septum, mid infero septal segment, mid inferiotn segment, basal infero septal segment.  -repeat ECHO with concern for mitral stenosis, increased gradient across mitral valve, discussed with cards, could be related to difference in rhythm between 2 echo's, patient is not interested in any further workup at this time, recommend repeat echo in 1 month -Continues on midodrine and Florinef -I discussed my concerns regarding her prognosis with patient, she declines palliative care at this time   Severe MR sp mitral clip X2, bicuspid MVR, tricuspid valve ring -Postop course complicated by cardiogenic/septic shock with cardiotomy and prolonged complicated hospital stay -Repeat echo with increased gradient across mitral valve concerning for mitral stenosis, see discussion above  Anemia of chronic renal disease with hgb at 9.6 -Anemia suggestive of iron deficiency and chronic disease -Given IV iron, continue EPO with dialysis    Atrial fibrillation (HCC) Continue amiodarone. Anticoagulation with warfarin.    Depression Continue with sertraline and mirtazapine.    Dyslipidemia Continue with statin therapy.   Discharge Exam: Vitals:   02/10/24 1030 02/10/24 1203  BP: (!) 91/56 (!) 89/55  Pulse: 84 79  Resp: 18 18  Temp:  99 F (37.2 C)  SpO2:  94%   Gen: Awake, Alert, Oriented X 3,  HEENT: no JVD Lungs: Good air movement bilaterally, CTAB CVS: S1S2/RRR Abd: soft, Non tender, non distended, BS  present Extremities: Trace edema Skin: no new rashes on exposed skin   Discharge Instructions    Allergies as of 02/10/2024       Reactions   Spironolactone    hyperkalemia        Medication List     TAKE these medications    amiodarone 200 MG tablet Commonly known as: PACERONE Take 1 tablet (200 mg total) by mouth daily.   ascorbic acid 500 MG tablet Commonly known as: VITAMIN C Take 0.5 tablets (250 mg total) by mouth 2 (two) times daily.   atorvastatin 10 MG tablet Commonly known as: LIPITOR Take 10 mg by mouth daily.   docusate sodium 100 MG capsule Commonly known as: COLACE Take 2 capsules (200 mg total) by mouth 2 (two) times daily.   fludrocortisone 0.1 MG tablet Commonly known as: FLORINEF Take 1 tablet (0.1 mg total) by mouth daily.   lidocaine-prilocaine cream Commonly known as: EMLA Apply 1 Application topically as needed (topical anesthesia for hemodialysis if Gebauers and Lidocaine injection are ineffective.).   midodrine 10 MG tablet Commonly known as: PROAMATINE Take 2 tablets (20 mg total) by mouth 3 (three) times daily with meals.   mirtazapine 7.5 MG tablet Commonly known as: REMERON Take 1 tablet (7.5 mg total) by mouth at bedtime.   multivitamin Tabs tablet Take 1 tablet by mouth 2 (two) times daily.   polyethylene glycol powder 17 GM/SCOOP powder Commonly known as: GLYCOLAX/MIRALAX Take 17 g by mouth daily as needed.   Senna-S 8.6-50 MG tablet Generic drug: senna-docusate Take 1 tablet by mouth at bedtime.   sertraline 25 MG tablet Commonly known as: ZOLOFT Take 1 tablet (25 mg total) by mouth daily.   warfarin 5 MG tablet Commonly known as: COUMADIN Take as directed. If you are unsure how to take this medication, talk to your nurse or doctor. Original instructions: Take 1 tablet (5 mg total) by mouth daily at 4 PM.       Allergies  Allergen Reactions   Spironolactone     hyperkalemia      The results of  significant diagnostics from this hospitalization (including imaging, microbiology, ancillary and laboratory) are listed below for reference.    Significant Diagnostic Studies: ECHOCARDIOGRAM COMPLETE Result Date: 02/09/2024    ECHOCARDIOGRAM REPORT   Patient Name:   KRISTIANNA SAPERSTEIN Date of Exam: 02/09/2024 Medical Rec #:  [id number]        Height:       63.0 in Accession #:    [id number]       Weight:       158.6 lb Date of Birth:  08-21-1951         BSA:          1.752 m Patient Age:    72 years         BP:           83/44 mmHg Patient Gender: F                HR:           85 bpm. Exam Location:  Inpatient Procedure: 2D Echo, Color Doppler, Cardiac Doppler and Intracardiac            Opacification Agent (Both Spectral and Color Flow Doppler were            utilized during procedure). Indications:  R55 Syncope  History:        Patient has prior history of Echocardiogram examinations, most                 recent 12/17/2023. CHF, Pulmonary HTN and ESRD; Risk                 Factors:Hypertension and Dyslipidemia.                  Mitral Valve: 31 mm Medtronic bioprosthetic valve valve is                 present in the mitral position. Procedure Date: 11/29/23.  Sonographer:    Irving Burton Senior RDCS Referring Phys: [id number] TIMOTHY S OPYD IMPRESSIONS  1. Left ventricular ejection fraction, by estimation, is 35 to 40%. The left ventricle has moderately decreased function. The left ventricle demonstrates global hypokinesis. Left ventricular diastolic function could not be evaluated.  2. Right ventricular systolic function is moderately reduced. The right ventricular size is moderately enlarged. There is moderately elevated pulmonary artery systolic pressure. The estimated right ventricular systolic pressure is 49.8 mmHg.  3. Left atrial size was moderately dilated.  4. MV mean gradient has increased from to at similar heart rates and the posterior leaflet appears to be fixed in the parasternal long axis.  Would recommend TEE to further evaluate bioprosthetic if clinically indicated. The mitral valve has  been repaired/replaced. Trivial mitral valve regurgitation. Severe mitral stenosis. The mean mitral valve gradient is 13.0 mmHg with average heart rate of 85 bpm. There is a 31 mm Medtronic bioprosthetic valve present in the mitral position. Procedure Date: 11/29/23. Echo findings are consistent with stenosis the mitral prosthesis.  5. 30mm TriRing implanted 11/29/23. The tricuspid valve is has been repaired/replaced. Tricuspid valve regurgitation is mild to moderate.  6. The aortic valve is calcified. Aortic valve regurgitation is trivial. Mild aortic valve stenosis. Aortic valve area, by VTI measures 1.22 cm. Aortic valve mean gradient measures 9.0 mmHg. Aortic valve Vmax measures 2.00 m/s.  7. The inferior vena cava is normal in size with greater than 50% respiratory variability, suggesting right atrial pressure of 3 mmHg. FINDINGS  Left Ventricle: Left ventricular ejection fraction, by estimation, is 35 to 40%. The left ventricle has moderately decreased function. The left ventricle demonstrates global hypokinesis. Definity contrast agent was given IV to delineate the left ventricular endocardial borders. The left ventricular internal cavity size was normal in size. There is no left ventricular hypertrophy. Left ventricular diastolic function could not be evaluated due to mitral valve replacement. Left ventricular diastolic function could not be evaluated. Right Ventricle: The right ventricular size is moderately enlarged. No increase in right ventricular wall thickness. Right ventricular systolic function is moderately reduced. There is moderately elevated pulmonary artery systolic pressure. The tricuspid  regurgitant velocity is 3.42 m/s, and with an assumed right atrial pressure of 3 mmHg, the estimated right ventricular systolic pressure is 49.8 mmHg. Left Atrium: Left atrial size was moderately dilated.  Right Atrium: Right atrial size was normal in size. Pericardium: There is no evidence of pericardial effusion. Mitral Valve: MV mean gradient has increased from to at similar heart rates and the posterior leaflet appears to be fixed in the parasternal long axis. Would recommend TEE to further evaluate bioprosthetic if clinically indicated. The mitral  valve has been repaired/replaced. Trivial mitral valve regurgitation. There is a 31 mm Medtronic bioprosthetic valve present in the mitral position. Procedure Date: 11/29/23.  Echo findings are consistent with stenosis of the mitral prosthesis. Severe mitral valve stenosis. MV peak gradient, 20.8 mmHg. The mean mitral valve gradient is 13.0 mmHg with average heart rate of 85 bpm. Tricuspid Valve: 30mm TriRing implanted 11/29/23. The tricuspid valve is has been repaired/replaced. Tricuspid valve regurgitation is mild to moderate. No evidence of tricuspid stenosis. Aortic Valve: The aortic valve is calcified. Aortic valve regurgitation is trivial. Mild aortic stenosis is present. Aortic valve mean gradient measures 9.0 mmHg. Aortic valve peak gradient measures 16.0 mmHg. Aortic valve area, by VTI measures 1.22 cm. Pulmonic Valve: The pulmonic valve was normal in structure. Pulmonic valve regurgitation is trivial. Aorta: The aortic root and ascending aorta are structurally normal, with no evidence of dilitation. Venous: The inferior vena cava is normal in size with greater than 50% respiratory variability, suggesting right atrial pressure of 3 mmHg. IAS/Shunts: No atrial level shunt detected by color flow Doppler.  LEFT VENTRICLE PLAX 2D LVIDd:         4.90 cm LVIDs:         3.90 cm LV PW:         0.70 cm LV IVS:        0.80 cm LVOT diam:     1.80 cm LV SV:         39 LV SV Index:   23 LVOT Area:     2.54 cm  RIGHT VENTRICLE RV S prime:     5.66 cm/s TAPSE (M-mode): 1.1 cm LEFT ATRIUM             Index        RIGHT ATRIUM           Index LA diam:        5.10  cm 2.91 cm/m   RA Area:     17.80 cm LA Vol (A2C):   43.9 ml 25.06 ml/m  RA Volume:   55.00 ml  31.39 ml/m LA Vol (A4C):   89.7 ml 51.19 ml/m LA Biplane Vol: 67.9 ml 38.75 ml/m  AORTIC VALVE AV Area (Vmax):    1.23 cm AV Area (Vmean):   1.17 cm AV Area (VTI):     1.22 cm AV Vmax:           200.00 cm/s AV Vmean:          141.667 cm/s AV VTI:            0.324 m AV Peak Grad:      16.0 mmHg AV Mean Grad:      9.0 mmHg LVOT Vmax:         96.50 cm/s LVOT Vmean:        65.200 cm/s LVOT VTI:          0.155 m LVOT/AV VTI ratio: 0.48  AORTA Ao Root diam: 2.70 cm Ao Asc diam:  2.80 cm MITRAL VALVE              TRICUSPID VALVE MV Area VTI:  0.73 cm    TV Peak grad:   6.2 mmHg MV Peak grad: 20.8 mmHg   TV Mean grad:   3.0 mmHg MV Mean grad: 13.0 mmHg   TV Vmax:        1.24 m/s MV Vmax:      2.28 m/s    TV Vmean:       86.4 cm/s MV Vmean:     162.2 cm/s  TV VTI:  0.21 msec                           TR Peak grad:   46.8 mmHg                           TR Vmax:        342.00 cm/s                            SHUNTS                           Systemic VTI:  0.16 m                           Systemic Diam: 1.80 cm Arvilla Meres MD Electronically signed by Arvilla Meres MD Signature Date/Time: 02/09/2024/10:53:41 AM    Final    DG Chest 1 View Result Date: 02/07/2024 CLINICAL DATA:  Syncope.  Orthopnea. EXAM: CHEST  1 VIEW COMPARISON:  Chest radiograph 01/15/2024. FINDINGS: Right-sided dialysis catheter in similar position. There is cardiomegaly with vascular congestion and edema, progressed since the prior radiograph. Small left pleural effusion and left lung base atelectasis or infiltrate. No pneumothorax. Median sternotomy wires. No acute osseous pathology. IMPRESSION: 1. Cardiomegaly with vascular congestion and edema. 2. Small left pleural effusion and left lung base atelectasis or infiltrate. Electronically Signed   By: Elgie Collard M.D.   On: 02/07/2024 19:59   IR Removal Tun Cv Cath W/O FL Result  Date: 01/20/2024 INDICATION: 73 year old female with chronic kidney disease with tunneled CV catheter for removal today. EXAM: REMOVAL TUNNELED CENTRAL VENOUS CATHETER MEDICATIONS: None ANESTHESIA/SEDATION: None FLUOROSCOPY: None used COMPLICATIONS: None immediate. PROCEDURE: Informed written consent was obtained from the patient after a thorough discussion of the procedural risks, benefits and alternatives. All questions were addressed. Maximal Sterile Barrier Technique was utilized including caps, mask, sterile gowns, sterile gloves, sterile drape, hand hygiene and skin antiseptic. A timeout was performed prior to the initiation of the procedure. The patient's left chest and catheter was prepped and draped in a normal sterile fashion. Heparin was removed from both ports of catheter. 1% lidocaine was used for local anesthesia. Using gentle blunt dissection the cuff of the catheter was exposed and the catheter was removed in it's entirety. Pressure was held till hemostasis was obtained. A sterile dressing was applied. The patient tolerated the procedure well with no immediate complications. IMPRESSION: Successful catheter removal as described above. Performed By Theresa Mulligan, PA-C Electronically Signed   By: Marliss Coots M.D.   On: 01/20/2024 14:48   DG Chest 1 View Result Date: 01/16/2024 CLINICAL DATA:  Evaluate for central line complication EXAM: PORTABLE CHEST 1 VIEW COMPARISON:  12/24/2023 FINDINGS: Dialysis catheter is again identified and stable. Left tunneled PICC is seen with the catheter tip at the cavoatrial junction. Postsurgical changes are noted. Elevation of left hemidiaphragm is seen with left basilar atelectasis. Right lung is clear. IMPRESSION: Left basilar atelectasis. Tubes and lines as described above stable from the prior exam. Electronically Signed   By: Alcide Clever M.D.   On: 01/16/2024 01:10    Microbiology: No results found for this or any previous visit (from the past 240  hours).   Labs: Basic Metabolic Panel: Recent Labs  Lab 02/07/24 1831 02/07/24  2126 02/08/24 0534 02/08/24 1245 02/09/24 0310 02/10/24 0232  NA 137  --  138  --  137 133*  K 3.5  --  3.7  --  3.8 3.6  CL 94*  --  100  --  100 97*  CO2 27  --  26  --  25 26  GLUCOSE 103*  --  99  --  104* 110*  BUN 24*  --  28*  --  36* 17  CREATININE 4.61*  --  5.39*  --  6.35* 3.39*  CALCIUM 9.0  --  8.8*  --  8.7* 9.1  PHOS  --  3.4  --  4.8*  --   --    Liver Function Tests: Recent Labs  Lab 02/07/24 2126  AST 38  ALT 37  ALKPHOS 89  BILITOT 3.7*  PROT 6.3*  ALBUMIN 2.8*   No results for input(s): "LIPASE", "AMYLASE" in the last 168 hours. No results for input(s): "AMMONIA" in the last 168 hours. CBC: Recent Labs  Lab 02/07/24 1513 02/07/24 2124 02/08/24 0534 02/09/24 0310 02/10/24 0232  WBC 12.7* 10.5 7.8 9.6 9.3  NEUTROABS  --  7.6  --   --   --   HGB 10.7* 10.2* 9.6* 9.4* 9.5*  HCT 35.5* 33.1* 31.1* 30.8* 30.6*  MCV 99.4 98.5 98.4 98.4 97.8  PLT 43* 53* 60* 82* 107*   Cardiac Enzymes: No results for input(s): "CKTOTAL", "CKMB", "CKMBINDEX", "TROPONINI" in the last 168 hours. BNP: BNP (last 3 results) Recent Labs    08/26/23 1103 09/22/23 0905 02/07/24 1831  BNP 781.0* 926.0* >4,500.0*    ProBNP (last 3 results) No results for input(s): "PROBNP" in the last 8760 hours.  CBG: Recent Labs  Lab 02/09/24 0614 02/09/24 1836 02/10/24 0615  GLUCAP 106* 93 108*       Signed:  Zannie Cove MD.  [facility] 02/10/2024, 1:25 PM

## 2024-02-10 NOTE — Plan of Care (Signed)
  Problem: Cardiac: Goal: Will achieve and/or maintain adequate cardiac output Outcome: Progressing

## 2024-02-10 NOTE — Progress Notes (Deleted)
 Subjective:    Patient ID: Vanessa [name], female    DOB: [birth year], 73 y.o.   MRN: [id number]  HPI   Pain Inventory Average Pain {NUMBERS; 0-10:5044} Pain Right Now {NUMBERS; 0-10:5044} My pain is {PAIN DESCRIPTION:21022940}  In the last 24 hours, has pain interfered with the following? General activity {NUMBERS; 0-10:5044} Relation with others {NUMBERS; 0-10:5044} Enjoyment of life {NUMBERS; 0-10:5044} What TIME of day is your pain at its worst? {time of day:24191} Sleep (in general) {BHH GOOD/FAIR/POOR:22877}  Pain is worse with: {ACTIVITIES:21022942} Pain improves with: {PAIN IMPROVES WUJW:11914782} Relief from Meds: {NUMBERS; 0-10:5044}  {MOBILITY NFA:21308657}  {FUNCTION:21022946}  {NEURO/PSYCH:21022948}  {CPRM PRIOR STUDIES:21022953}  {CPRM PHYSICIANS INVOLVED IN YOUR CARE:21022954}    Family History  Problem Relation Age of Onset   Breast cancer Mother 64   Heart disease Father    Breast cancer Sister 66   Colon cancer Neg Hx    Esophageal cancer Neg Hx    Rectal cancer Neg Hx    Stomach cancer Neg Hx    Thyroid disease Neg Hx    Social History   Socioeconomic History   Marital status: Single    Spouse name: Not on file   Number of children: Not on file   Years of education: Not on file   Highest education level: Not on file  Occupational History   Occupation: Retired    Comment: Textile, [employer]  Tobacco Use   Smoking status: Never   Smokeless tobacco: Never  Vaping Use   Vaping status: Never Used  Substance and Sexual Activity   Alcohol use: No    Alcohol/week: 0.0 standard drinks of alcohol   Drug use: No   Sexual activity: Not on file  Other Topics Concern   Not on file  Social History Narrative   Not on file   Social Drivers of Health   Financial Resource Strain: Not on file  Food Insecurity: No Food Insecurity (02/08/2024)   Hunger Vital Sign    Worried About Running Out of Food in the Last Year: Never true     Ran Out of Food in the Last Year: Never true  Transportation Needs: No Transportation Needs (02/08/2024)   PRAPARE - Administrator, Civil Service (Medical): No    Lack of Transportation (Non-Medical): No  Physical Activity: Not on file  Stress: Not on file  Social Connections: Socially Isolated (02/08/2024)   Social Connection and Isolation Panel [NHANES]    Frequency of Communication with Friends and Family: Twice a week    Frequency of Social Gatherings with Friends and Family: Never    Attends Religious Services: Never    Database administrator or Organizations: No    Attends Banker Meetings: Never    Marital Status: Separated   Past Surgical History:  Procedure Laterality Date   CARDIAC CATHETERIZATION  2003   normal coronary arteries   CARDIAC CATHETERIZATION  2013  Nov   h/o nonischemic DCM EF 35-40% increase LVEDP    EXPLORATION POST OPERATIVE OPEN HEART N/A 11/29/2023   Procedure: EXPLORATION POST OPERATIVE OPEN HEART;  Surgeon: Eugenio Hoes, MD;  Location: [facility] OR;  Service: Open Heart Surgery;  Laterality: N/A;   HYSTEROSCOPY WITH D & C  12/02/2012   Procedure: DILATATION AND CURETTAGE /HYSTEROSCOPY;  Surgeon: Dorien Chihuahua. Richardson Dopp, MD;  Location: [facility] ORS;  Service: Gynecology;  Laterality: N/A;  cervical polypectomy   IR FLUORO GUIDE CV LINE LEFT  12/24/2023  IR FLUORO GUIDE CV LINE RIGHT  12/24/2023   IR REMOVAL TUN CV CATH W/O FL  01/20/2024   IR US GUIDE VASC ACCESS LEFT  12/24/2023   IR US GUIDE VASC ACCESS RIGHT  12/24/2023   LEFT AND RIGHT HEART CATHETERIZATION WITH CORONARY ANGIOGRAM N/A 02/15/2015   Procedure: LEFT AND RIGHT HEART CATHETERIZATION WITH CORONARY ANGIOGRAM;  Surgeon: Corky Crafts, MD;  Location: [facility] CATH LAB;  Service: Cardiovascular;  Laterality: N/A;   MITRAL VALVE REPLACEMENT N/A 11/29/2023   Procedure: MITRAL VALVE (MV) REPLACEMENT USING MOSAIC 310CINCH TISSUE VALVE;  Surgeon: Eugenio Hoes, MD;  Location: [facility] OR;  Service:  Open Heart Surgery;  Laterality: N/A;   RIGHT HEART CATH N/A 12/17/2023   Procedure: RIGHT HEART CATH;  Surgeon: Laurey Morale, MD;  Location: [facility] INVASIVE CV LAB;  Service: Cardiovascular;  Laterality: N/A;   RIGHT/LEFT HEART CATH AND CORONARY ANGIOGRAPHY N/A 10/12/2023   Procedure: RIGHT/LEFT HEART CATH AND CORONARY ANGIOGRAPHY;  Surgeon: Laurey Morale, MD;  Location: [facility] INVASIVE CV LAB;  Service: Cardiovascular;  Laterality: N/A;   TEE WITHOUT CARDIOVERSION N/A 10/11/2018   Procedure: TRANSESOPHAGEAL ECHOCARDIOGRAM (TEE);  Surgeon: Laurey Morale, MD;  Location: [facility] ENDOSCOPY;  Service: Cardiovascular;  Laterality: N/A;   TEE WITHOUT CARDIOVERSION N/A 09/07/2023   Procedure: TRANSESOPHAGEAL ECHOCARDIOGRAM;  Surgeon: Laurey Morale, MD;  Location[facility] INVASIVE CV LAB;  Service: Cardiovascular;  Laterality: N/A;   TEE WITHOUT CARDIOVERSION N/A 11/29/2023   Procedure: TRANSESOPHAGEAL ECHOCARDIOGRAM;  Surgeon: Eugenio Hoes, MD;  Location: [facility] OR;  Service: Open Heart Surgery;  Laterality: N/A;   TRANSCATHETER MITRAL EDGE TO EDGE REPAIR N/A 12/14/2018   Procedure: MITRAL VALVE REPAIR;  Surgeon: Tonny Bollman, MD;  Location: [facility] INVASIVE CV LAB;  Service: Cardiovascular;  Laterality: N/A;   TRICUSPID VALVE REPLACEMENT N/A 11/29/2023   Procedure: TRICUSPID VALVE REPAIR USING MC3 EDWARDS TRICUSPID ANNULOPLASTY RING;  Surgeon: Eugenio Hoes, MD;  Location: [facility] OR;  Service: Open Heart Surgery;  Laterality: N/A;   Past Medical History:  Diagnosis Date   CHF (congestive heart failure) (HCC)    CKD (chronic kidney disease)    Heart murmur    Hypertension    Moderate mitral regurgitation    moderate to severe MR with moderate pulmonary HTN   Multinodular goiter    Nonischemic cardiomyopathy (HCC)    EF 30-35% by echo 2015   Pulmonary HTN (HCC)    moderate with PASP by echo 2015   PVC's (premature ventricular contractions)    There were no vitals taken for this visit.  Opioid  Risk Score:   Fall Risk Score:  `1  Depression screen PHQ 2/9      No data to display          Review of Systems     Objective:   Physical Exam        Assessment & Plan:

## 2024-02-10 NOTE — Progress Notes (Signed)
 Dr. Herbie Baltimore was on bedside seeing the patient, notified MD pt's bp 69/53, 73/47 while sitting up, non symptomatic. And it did drop with PT when pt got up in chair. Pt denies any dizziness , has scheduled midodrine. Once got back in bed, Bp 91/56. Plan of care continues.

## 2024-02-10 NOTE — Care Management Important Message (Signed)
 Important Message  Patient Details  Name: Vanessa [name] MRN: [id number] Date of Birth: [birth year]   Important Message Given:  Yes - Medicare IM     [name] 02/10/2024, 2:25 PM

## 2024-02-10 NOTE — Progress Notes (Addendum)
 Patient Name: Vanessa [name] Date of Encounter: 02/10/2024 [facility] Cardiologist: None   Patient Profile.     73 y.o. female with a hx of CKD, nonischemic cardiomyopathy with EF 40 to 45%, severe MR status post MitraClip x 2 who had recent complex hospital course for mitral valve replacement and tricuspid repair complicated by post op cardiac tamponade with hemopericardium requiring clot removal IntraOp, hemorrhagic shock, ATN now on ESRD, A-fib and hypoxic respiratory failure who is being seen 02/07/2024 for the evaluation of syncope, volume overload at the request of ED.   Interval Summary  .    Breathing has improved, frustrated and wants to go home today.   Vital Signs .    Vitals:   02/09/24 2337 02/10/24 0327 02/10/24 0623 02/10/24 0801  BP: (!) 98/57 (!) 83/57  (!) 91/56  Pulse: 89 84  80  Resp: 20 17 20 19   Temp: 98.9 F (37.2 C) 97.7 F (36.5 C)  98.6 F (37 C)  TempSrc: Oral Oral  Oral  SpO2: 91% 92%  93%  Weight:   70.9 kg   Height:        Intake/Output Summary (Last 24 hours) at 02/10/2024 0852 Last data filed at 02/09/2024 2022 Gross per 24 hour  Intake --  Output 2500 ml  Net -2500 ml      02/10/2024    6:23 AM 02/09/2024    5:45 PM 02/09/2024    1:47 PM  Last 3 Weights  Weight (lbs) 156 lb 4.9 oz 149 lb 14.6 oz 155 lb 3.3 oz  Weight (kg) 70.9 kg 68 kg 70.4 kg      Telemetry/ECG    Sinus Rhythm - Personally Reviewed  Echocardiogram 02/09/2024: EF 35 to 40% with global hypokinesis.  RV function moderately reduced and moderately enlarged.  Elevated RV P/PSP with moderate LA dilation.Gradient of the prosthetic mitral valve has increased from 6 to 13 mmHg with possibly fixed posterior leaflet of the 31 mm bioprosthesis.  Suggest moderate mitral stenosis; tricuspid ring in place.  Calcified aortic valve with mild stenosis. (personally reviewed and discussed with Dr. Rennis Golden who feels that the gradient was not adequately read on the previous  echocardiogram since the patient was in A-fib and therefore we cannot tell for certain if this is a difference/change from the previous study => he recommends close outpatient follow-up)  Physical Exam .    GEN: No acute distress.   Neck: ++ JVD to jaw Cardiac: RRR, 2/6 systolic murmur LLUB, no rubs, or gallops.  Respiratory: Clear to auscultation bilaterally. GI: Soft, nontender, non-distended  MS: Trace bilateral LE edema  Assessment & Plan .     73 y.o. female with a hx of CKD, nonischemic cardiomyopathy with EF 40 to 45%, severe MR status post MitraClip x 2 who had recent complex hospital course for mitral valve replacement and tricuspid repair complicated by post op cardiac tamponade with hemopericardium requiring clot removal IntraOp, hemorrhagic shock, ATN now on ESRD, A-fib and hypoxic respiratory failure who is being seen 02/07/2024 for the evaluation of syncope, volume overload at the request of ED.    Acute hypoxic respiratory failure Pulmonary edema --Presented to the ED from dialysis with shortness of breath and syncopal episode.  Reports she has issues with being able to fully complete treatments as things are complicated by hypotension requiring high doses of midodrine. -- Chest x-ray with cardiomegaly, vascular congestion, small left pleural effusion with atelectasis versus infiltrate -- much improvement after HD  ESRD on HD c/b vasoplegia -- Followed by nephrology as noted above dialysis sessions have been complicated by episodes of hypotension requiring high doses of midodrine.   -- was able to complete HD on 3/11 with removal of 3.5L, additional 2.5L removed 3/12 -- nephrology planning for torsemide 100mg  daily at discharge  -- Currently on midodrine 20 mg 3 times daily, Florinef 0.1 mg daily Consider the use of phenylephrine or other oral stimulants to allow for additional blood pressure during dialysis.   Severe MR s/p MitraClip failure x2 w/ bicuspid MVR, TV ring  Mild to moderate TR Acute on Chronic HFrEF Moderate RV dysfunction  Pulmonary HTN -- Complicated postop course with cardiogenic/septic shock with cardiotomy with prolonged hospitalization recently -- Echo 12/2023 with LVEF of 35 to 40%, moderately reduced/enlarged RV, severe PA pressure of 64 mmHg, moderately dilated left atria, mean mitral valve gradient of 6 mmHg, tricuspid valve gradient of 1.4 mmHg -- Echo 3/12 LVEF of 35 to 40%, global hypokinesis, moderately reduced/enlarged RV, PA pressure of 49.8 mmHg, moderately dilated left atria.  Mitral valve mean gradient increased from 6 to 13 mmHg with posterior leaflet appearing to be fixed in the parasternal long axis.  Recommendations for TEE for further evaluation.  Tricuspid valve ring with mild to moderate regurgitation, mild aortic stenosis. -- GDMT: limited in the setting of hypotension  -- discussed with patient echo results with recommendations but the limitations with her persistent hypotension. She says she does want to have any further procedures done, just wants to go home. MD to follow up = she also indicated me that she wanted to go home.  She was happy to hear less concerning echo results, but would not want further procedures to be done while she is here.  Certainly would not want TEE done. => Review of the echocardiogram did not seem to be a significant, and Dr. Loleta Chance did not feel that with her blood pressure issues that she would be a candidate for invasive TEE due to concerns for worsening dyspnea, hypoxia which could be potentially more concerning.  Paroxysmal atrial fibrillation -- On amiodarone 200 mg daily as well as Coumadin (PTA) -- INR 2.3, coumadin per pharmacy   For questions or updates, please contact [facility] Please consult [email or web address] for contact info under      Signed, Laverda Page, NP   ATTENDING ATTESTATION  I have seen, examined and evaluated the patient this morning on rounds along with  Lillia Abed, NP.  After reviewing all the available data and chart, we discussed the patients laboratory, study & physical findings as well as symptoms in detail.  I agree with her findings, examination as well as impression recommendations as per our discussion.    Attending adjustments noted in italics.   Difficult scenario.  She is finally been able to be dialyzed adequately enough for her to be less short of breath.  Unfortunately we are very much limited by her blood pressures.  She is on high-dose midodrine as well as Florinef.  The issue is with her becoming more volume overloaded in between dialysis runs.  Nephrology has been able to gradually dialyze her and is planning to add oral diuretic on non--dialysis days.  I do think she is probably is well treated now she can be in the inpatient setting and should be fit stable for discharge my concern is that until we can establish adequate and adequate dialysis regimen, but she may return.  Unfortunately we are very limited by her  hypotension and when it comes to GDMT.   Marykay Lex, MD, MS Bryan Lemma, M.D., M.S. Interventional Cardiologist  [facility]  Pager # [phone] Phone # [phone] [address] [place], [place] [address]

## 2024-02-10 NOTE — Plan of Care (Signed)

## 2024-02-10 NOTE — Progress Notes (Addendum)
 PHARMACY - ANTICOAGULATION CONSULT NOTE  Pharmacy Consult for warfarin Indication: atrial fibrillation  Allergies  Allergen Reactions   Spironolactone     hyperkalemia    Patient Measurements: Height: 5\' 3"  (160 cm) Weight: 70.9 kg (156 lb 4.9 oz) IBW/kg (Calculated) : 52.4  Vital Signs: Temp: 97.7 F (36.5 C) (03/13 0327) Temp Source: Oral (03/13 0327) BP: 83/57 (03/13 0327) Pulse Rate: 84 (03/13 0327)  Labs: Recent Labs    02/07/24 1643 02/07/24 1831 02/08/24 0534 02/09/24 0310 02/10/24 0232  HGB  --    < > 9.6* 9.4* 9.5*  HCT  --    < > 31.1* 30.8* 30.6*  PLT  --    < > 60* 82* 107*  LABPROT  --    < > 30.0* 30.2* 25.5*  INR  --    < > 2.8* 2.9* 2.3*  CREATININE  --    < > 5.39* 6.35* 3.39*  TROPONINIHS 21*  --   --   --   --    < > = values in this interval not displayed.    Estimated Creatinine Clearance: 14.2 mL/min (A) (by C-G formula based on SCr of 3.39 mg/dL (H)).   Medical History: Past Medical History:  Diagnosis Date   CHF (congestive heart failure) (HCC)    CKD (chronic kidney disease)    Heart murmur    Hypertension    Moderate mitral regurgitation    moderate to severe MR with moderate pulmonary HTN   Multinodular goiter    Nonischemic cardiomyopathy (HCC)    EF 30-35% by echo 2015   Pulmonary HTN (HCC)    moderate with PASP by echo 2015   PVC's (premature ventricular contractions)     Medications:  Scheduled:   amiodarone  200 mg Oral Daily   Chlorhexidine Gluconate Cloth  6 each Topical Q0600   fludrocortisone  0.1 mg Oral Daily   midodrine  20 mg Oral TID WC   mirtazapine  7.5 mg Oral QHS   polyethylene glycol  17 g Oral Daily   senna-docusate  1 tablet Oral QHS   sertraline  25 mg Oral Daily   sodium chloride flush  3 mL Intravenous Q12H   Warfarin - Pharmacist Dosing Inpatient   Does not apply q1600   Infusions:     Assessment: 73 yo female with PMH of ESRD on HD, afib, HF, and s/p mitral and tricuspid valve  replacement in 10/2023. Pharmacy consulted to dose warfarin in setting of afib.   -INR= 2.3 (trend down) -plt= 133 (noted 40s on 3/0) -She may have increased sensitivity to warfarin in the acute setting  Home dose is 5 mg daily per last anticoag visit note on 3/4. Last dose of warfarin was on 3/9.   Goal of Therapy:  INR 2-3 Monitor platelets by anticoagulation protocol: Yes   Plan:  -Warfarin 1mg  po today -Daily PT/INR  Harland German, PharmD Clinical Pharmacist **Pharmacist phone directory can now be found on [email or web address] ([email or web address]).  Listed under [facility].

## 2024-02-10 NOTE — Progress Notes (Signed)
 Pharmacist Heart Failure Core Measure Documentation  Assessment: Vanessa [name] has an EF documented as 35-40% on 02/09/24 by Echo.  Rationale: Heart failure patients with left ventricular systolic dysfunction (LVSD) and an EF < 40% should be prescribed an angiotensin converting enzyme inhibitor (ACEI) or angiotensin receptor blocker (ARB) at discharge unless a contraindication is documented in the medical record.  This patient is not currently on an ACEI or ARB for HF.  This note is being placed in the record in order to provide documentation that a contraindication to the use of these agents is present for this encounter.  ACE Inhibitor or Angiotensin Receptor Blocker is contraindicated (specify all that apply)  []   ACEI allergy AND ARB allergy []   Angioedema []   Moderate or severe aortic stenosis []   Hyperkalemia [x]   Hypotension []   Renal artery stenosis []   Worsening renal function, preexisting renal disease or dysfunction  Harland German, PharmD Clinical Pharmacist **Pharmacist phone directory can now be found on [email or web address] ([email or web address]).  Listed under [facility].

## 2024-02-10 NOTE — Evaluation (Signed)
 Occupational Therapy Evaluation Patient Details Name: Vanessa [name] MRN: [id number] DOB: 02-03-1951 Today's Date: 02/10/2024   History of Present Illness   73 y.o. female who now presents from her dialysis center after syncopal episode. +pulmonary edema, CHF 2/2 cardiomyopathy,  PMH significant for ESRD, atrial fibrillation on warfarin, chronic HFrEF, and mitral valve replacement and tricuspid ring on 11/29/2023 complicated by tamponade and shock     Clinical Impressions Pt walks with a RW and is assisted to stand from lower surfaces and for LB ADLs by her sister. She uses her w/c to get to HD. Pt sponge bathes due to her HD catheter in a chair at the sink. Pt stood with CGA from heightened bed and ambulated to sink to wash her hands with CGA to supervision assist. Returned to supine. Pt without symptoms of orthostastic hypotension. Did not repeat blood pressures as RN had just done them prior to OT's arrival. Educated pt in compensatory strategies for LB ADLs. Will follow acutely, do not anticipate pt will need post acute OT.      If plan is discharge home, recommend the following:   A little help with walking and/or transfers;A little help with bathing/dressing/bathroom;Assistance with cooking/housework;Assist for transportation;Help with stairs or ramp for entrance     Functional Status Assessment   Patient has had a recent decline in their functional status and demonstrates the ability to make significant improvements in function in a reasonable and predictable amount of time.     Equipment Recommendations   None recommended by OT     Recommendations for Other Services         Precautions/Restrictions   Precautions Precautions: Fall Precaution/Restrictions Comments: monitor BP Restrictions Weight Bearing Restrictions Per Provider Order: No     Mobility Bed Mobility Overal bed mobility: Modified Independent                  Transfers Overall transfer  level: Needs assistance Equipment used: Rolling walker (2 wheels) Transfers: Sit to/from Stand Sit to Stand: Contact guard assist           General transfer comment: bed at height of home, reports her sister helps her stand from low surfaces at home      Balance Overall balance assessment: Needs assistance Sitting-balance support: No upper extremity supported, Feet unsupported Sitting balance-Leahy Scale: Good     Standing balance support: Bilateral upper extremity supported, Reliant on assistive device for balance Standing balance-Leahy Scale: Poor Standing balance comment: can release walker in static standing to wash hands                           ADL either performed or assessed with clinical judgement   ADL                                         General ADL Comments: set up to supervision due to low BP, educated in use of AE for LB bathing and dressing     Vision Baseline Vision/History: 0 No visual deficits Ability to See in Adequate Light: 0 Adequate       Perception         Praxis         Pertinent Vitals/Pain Pain Assessment Pain Assessment: No/denies pain     Extremity/Trunk Assessment Upper Extremity Assessment Upper Extremity Assessment: Overall WFL for tasks  assessed   Lower Extremity Assessment Lower Extremity Assessment: Defer to PT evaluation   Cervical / Trunk Assessment Cervical / Trunk Assessment: Normal   Communication Communication Communication: No apparent difficulties   Cognition Arousal: Alert Behavior During Therapy: WFL for tasks assessed/performed Cognition: No apparent impairments                               Following commands: Intact       Cueing  General Comments          Exercises     Shoulder Instructions      Home Living Family/patient expects to be discharged to:: Private residence Living Arrangements: Alone Available Help at Discharge: Family;Available  24 hours/day Type of Home: House Home Access: Level entry     Home Layout: One level     Bathroom Shower/Tub: Sponge bathes at baseline   Bathroom Toilet: Standard     Home Equipment: BSC/3in1;Rolling Walker (2 wheels);Wheelchair - manual   Additional Comments: uses w/c to HD, sponge bathes due to HD catheter, keeps chair or bed behind her during standing ADLs      Prior Functioning/Environment Prior Level of Function : Needs assist             Mobility Comments: using RW ADLs Comments: assisted for LB ADLs    OT Problem List: Decreased strength   OT Treatment/Interventions: Self-care/ADL training;DME and/or AE instruction;Therapeutic activities;Patient/family education;Balance training      OT Goals(Current goals can be found in the care plan section)   Acute Rehab OT Goals OT Goal Formulation: With patient Time For Goal Achievement: 02/24/24 Potential to Achieve Goals: Good   OT Frequency:  Min 2X/week    Co-evaluation              AM-PAC OT "6 Clicks" Daily Activity     Outcome Measure Help from another person eating meals?: None Help from another person taking care of personal grooming?: A Little Help from another person toileting, which includes using toliet, bedpan, or urinal?: A Little Help from another person bathing (including washing, rinsing, drying)?: A Little Help from another person to put on and taking off regular upper body clothing?: None Help from another person to put on and taking off regular lower body clothing?: A Little 6 Click Score: 20   End of Session Equipment Utilized During Treatment: Gait belt;Rolling walker (2 wheels) Nurse Communication: Other (comment) (RN did orthostatics just before OT's arrival, did not repeat)  Activity Tolerance: Patient tolerated treatment well Patient left: in bed;with call bell/phone within reach  OT Visit Diagnosis: Muscle weakness (generalized) (M62.81)                Time: 0981-1914 OT  Time Calculation (min): 25 min Charges:  OT General Charges $OT Visit: 1 Visit OT Evaluation $OT Eval Moderate Complexity: 1 Mod OT Treatments $Self Care/Home Management : 8-22 mins  Berna Spare, OTR/L Acute Rehabilitation Services Office: [phone]   Vanessa [name] 02/10/2024, 3:51 PM

## 2024-02-11 ENCOUNTER — Other Ambulatory Visit (HOSPITAL_COMMUNITY): Payer: Self-pay

## 2024-02-11 ENCOUNTER — Encounter: Attending: Physical Medicine and Rehabilitation | Admitting: Physical Medicine and Rehabilitation

## 2024-02-11 LAB — RENAL FUNCTION PANEL
Albumin: 2.9 g/dL — ABNORMAL LOW (ref 3.5–5.0)
Anion gap: 12 (ref 5–15)
BUN: 27 mg/dL — ABNORMAL HIGH (ref 8–23)
CO2: 25 mmol/L (ref 22–32)
Calcium: 9.1 mg/dL (ref 8.9–10.3)
Chloride: 99 mmol/L (ref 98–111)
Creatinine, Ser: 5.49 mg/dL — ABNORMAL HIGH (ref 0.44–1.00)
GFR, Estimated: 8 mL/min — ABNORMAL LOW (ref >60)
Glucose, Bld: 96 mg/dL (ref 70–99)
Phosphorus: 3.6 mg/dL (ref 2.5–4.6)
Potassium: 3.7 mmol/L (ref 3.5–5.1)
Sodium: 136 mmol/L (ref 135–145)

## 2024-02-11 LAB — CBC
HCT: 31.3 % — ABNORMAL LOW (ref 36.0–46.0)
Hemoglobin: 9.7 g/dL — ABNORMAL LOW (ref 12.0–15.0)
MCH: 30.7 pg (ref 26.0–34.0)
MCHC: 31 g/dL (ref 30.0–36.0)
MCV: 99.1 fL (ref 80.0–100.0)
Platelets: 164 10*3/uL (ref 150–400)
RBC: 3.16 MIL/uL — ABNORMAL LOW (ref 3.87–5.11)
RDW: 16.9 % — ABNORMAL HIGH (ref 11.5–15.5)
WBC: 8.9 10*3/uL (ref 4.0–10.5)
nRBC: 0.2 % (ref 0.0–0.2)

## 2024-02-11 LAB — PROTIME-INR
INR: 1.7 — ABNORMAL HIGH (ref 0.8–1.2)
Prothrombin Time: 20.2 s — ABNORMAL HIGH (ref 11.4–15.2)

## 2024-02-11 LAB — GLUCOSE, CAPILLARY: Glucose-Capillary: 100 mg/dL — ABNORMAL HIGH (ref 70–99)

## 2024-02-11 MED ORDER — WARFARIN SODIUM 5 MG PO TABS: 5.0000 mg | ORAL_TABLET | Freq: Once | ORAL | Status: DC

## 2024-02-11 MED ORDER — HEPARIN SODIUM (PORCINE) 1000 UNIT/ML IJ SOLN
3200.0000 [IU] | Freq: Once | INTRAMUSCULAR | Status: AC
  Administered 2024-02-11: 3200 [IU]

## 2024-02-11 MED ORDER — HEPARIN SODIUM (PORCINE) 1000 UNIT/ML IJ SOLN
INTRAMUSCULAR | Status: AC
  Filled 2024-02-11: qty 4

## 2024-02-11 NOTE — Progress Notes (Signed)
 Discharge Cancelled yesterday SBP dropping to 59 with activity/standing Seen again after HD today, SBP dropping to 60s with activity She is asymptomatic and adamant to leave AMA Discussed my concern that with BiV Failure and Valvular heart disease, Chronic severe Hypotension on max dose Midodrine and FLorinef she will not be able to tolerate HD for too long, I recommended Palliative care discussions and consideration of Hospice, she declines this. Unfortunately anticipate Quick re-admission  Zannie Cove, MD

## 2024-02-11 NOTE — Progress Notes (Signed)
 PHARMACY - ANTICOAGULATION CONSULT NOTE  Pharmacy Consult for warfarin Indication: atrial fibrillation  Allergies  Allergen Reactions   Spironolactone     hyperkalemia    Patient Measurements: Height: 5\' 3"  (160 cm) Weight: 66.1 kg (145 lb 11.6 oz) (Taken via bed.) IBW/kg (Calculated) : 52.4  Vital Signs: Temp: 98.5 F (36.9 C) (03/14 1304) Temp Source: Oral (03/14 1304) BP: 106/71 (03/14 1304) Pulse Rate: 86 (03/14 1229)  Labs: Recent Labs    02/09/24 0310 02/10/24 0232 02/11/24 0340 02/11/24 0818  HGB 9.4* 9.5*  --  9.7*  HCT 30.8* 30.6*  --  31.3*  PLT 82* 107*  --  164  LABPROT 30.2* 25.5* 20.2*  --   INR 2.9* 2.3* 1.7*  --   CREATININE 6.35* 3.39*  --  5.49*    Estimated Creatinine Clearance: 8.5 mL/min (A) (by C-G formula based on SCr of 5.49 mg/dL (H)).   Medical History: Past Medical History:  Diagnosis Date   CHF (congestive heart failure) (HCC)    CKD (chronic kidney disease)    Heart murmur    Hypertension    Moderate mitral regurgitation    moderate to severe MR with moderate pulmonary HTN   Multinodular goiter    Nonischemic cardiomyopathy (HCC)    EF 30-35% by echo 2015   Pulmonary HTN (HCC)    moderate with PASP by echo 2015   PVC's (premature ventricular contractions)     Medications:  Scheduled:   amiodarone  200 mg Oral Daily   Chlorhexidine Gluconate Cloth  6 each Topical Q0600   fludrocortisone  0.1 mg Oral Daily   heparin sodium (porcine)       midodrine  20 mg Oral TID WC   mirtazapine  7.5 mg Oral QHS   polyethylene glycol  17 g Oral Daily   senna-docusate  1 tablet Oral QHS   sertraline  25 mg Oral Daily   sodium chloride flush  3 mL Intravenous Q12H   Warfarin - Pharmacist Dosing Inpatient   Does not apply q1600   Infusions:     Assessment: 73 yo female with PMH of ESRD on HD, afib, HF, and s/p mitral and tricuspid valve replacement in 10/2023. Pharmacy consulted to dose warfarin in setting of afib.   -INR=  2.3> 1.7 (trend down) -plt= 164 (noted 40s on 3/0) -She may have increased sensitivity to warfarin in the acute setting  Home dose is 5 mg daily per last anticoag visit note on 3/4. Last dose of warfarin was on 3/9.   Goal of Therapy:  INR 2-3 Monitor platelets by anticoagulation protocol: Yes   Plan:  -Warfarin 5mg  po today -Daily PT/INR  Harland German, PharmD Clinical Pharmacist **Pharmacist phone directory can now be found on [email or web address] ([email or web address]).  Listed under [facility].

## 2024-02-11 NOTE — Progress Notes (Signed)
 Nephrology Follow-Up Consult note   Outpatient dialysis unit: [facility] Outpatient dialysis prescription:  F180, BFR 350, 3K, 2.5 Ca, EDW 68.6, 4hr, TDC, no esa or Vit D   Assessment/Recommendations: Vanessa [name] is a/an 73 y.o. female with a past medical history notable for ESRD on HD admitted with hypotension.    # Dialysis dependent AKI: Started dialysis on 12/02/2023.  Considered AKI but likely nearing ESRD status.  Underwent dry ultrafiltration on 3/11 as well as dialysis on 3/12 with significant volume removal.  Plan to maintain MWF schedule while she remains inpatient.   # Volume/ hypertension: Significantly volume overloaded.  Ultrafiltration limited by hypotension in the outpatient setting.  Good volume removal while here.  Would discharge on torsemide 100 mg daily.  Volume removal with dialysis as able.   # Anemia of Chronic Kidney Disease: Hemoglobin 9.7 not currently receiving ESA in the outpatient setting.  Received the iron here.  Continue to monitor for now  # Secondary Hyperparathyroidism/Hyperphosphatemia: Calcium acceptable not on vitamin D.  Phosphorus also at goal   # Vascular access: [facility] with no issues   # Additional recommendations: - Dose all meds for creatinine clearance < 10 ml/min  - Unless absolutely necessary, no MRIs with gadolinium.  - Implement save arm precautions.  Prefer needle sticks in the dorsum of the hands or wrists.  No blood pressure measurements in arm. - If blood transfusion is requested during hemodialysis sessions, please alert [place] prior to the session.  - Use synthetic opioids (Fentanyl/Dilaudid) if needed   Recommendations conveyed to primary service.    Darnell Level [facility] 02/11/2024 11:49 AM  ___________________________________________________________  CC: Shortness of breath  Interval History/Subjective: Patient states she continues to feel well and wants to leave today.  Discharge canceled yesterday because of  low blood pressures.  However she says she is asymptomatic and had no dizziness.  She said she is leaving today no matter what   Medications:  Current Facility-Administered Medications  Medication Dose Route Frequency Provider Last Rate Last Admin   acetaminophen (TYLENOL) tablet 650 mg  650 mg Oral Q6H PRN Opyd, Lavone Neri, MD       Or   acetaminophen (TYLENOL) suppository 650 mg  650 mg Rectal Q6H PRN Opyd, Lavone Neri, MD       amiodarone (PACERONE) tablet 200 mg  200 mg Oral Daily Opyd, Lavone Neri, MD   200 mg at 02/10/24 1031   Chlorhexidine Gluconate Cloth 2 % PADS 6 each  6 each Topical Q0600 Darnell Level, MD       fludrocortisone (FLORINEF) tablet 0.1 mg  0.1 mg Oral Daily Opyd, Lavone Neri, MD   0.1 mg at 02/10/24 1031   heparin sodium (porcine) 1000 UNIT/ML injection            heparin sodium (porcine) injection 3,200 Units  3,200 Units Intracatheter Once Darnell Level, MD       midodrine (PROAMATINE) tablet 20 mg  20 mg Oral TID WC Opyd, Lavone Neri, MD   20 mg at 02/11/24 0749   mirtazapine (REMERON) tablet 7.5 mg  7.5 mg Oral QHS Opyd, Lavone Neri, MD   7.5 mg at 02/10/24 2102   polyethylene glycol (MIRALAX / GLYCOLAX) packet 17 g  17 g Oral Daily Zannie Cove, MD   17 g at 02/10/24 5409   senna-docusate (Senokot-S) tablet 1 tablet  1 tablet Oral QHS Briscoe Deutscher, MD   1 tablet at 02/09/24 2138  sertraline (ZOLOFT) tablet 25 mg  25 mg Oral Daily Opyd, Lavone Neri, MD   25 mg at 02/10/24 0805   sodium chloride flush (NS) 0.9 % injection 3 mL  3 mL Intravenous Q12H Opyd, Lavone Neri, MD   3 mL at 02/10/24 2103   Warfarin - Pharmacist Dosing Inpatient   Does not apply q1600 Theotis Burrow I, [facility]   Given at 02/10/24 1727      Review of Systems: 10 systems reviewed and negative except per interval history/subjective  Physical Exam: Vitals:   02/11/24 1130 02/11/24 1136  BP: 94/62 98/66  Pulse: 83 85  Resp: 18 18  Temp:    SpO2: 99% 99%   No intake/output data  recorded.  Intake/Output Summary (Last 24 hours) at 02/11/2024 1149 Last data filed at 02/10/2024 2100 Gross per 24 hour  Intake 200 ml  Output --  Net 200 ml   Constitutional: Lying in bed in no distress ENMT: ears and nose without scars or lesions, MMM CV: normal rate, trace edema in the lower extremities Respiratory: Bilateral chest rise, normal work of breathing Gastrointestinal: soft, non-tender, no palpable masses or hernias Skin: no visible lesions or rashes Psych: alert, judgement/insight appropriate, appropriate mood and affect with intermittent anger   Test Results I personally reviewed new and old clinical labs and radiology tests Lab Results  Component Value Date   NA 136 02/11/2024   K 3.7 02/11/2024   CL 99 02/11/2024   CO2 25 02/11/2024   BUN 27 (H) 02/11/2024   CREATININE 5.49 (H) 02/11/2024   GFR 63.02 06/06/2015   CALCIUM 9.1 02/11/2024   ALBUMIN 2.9 (L) 02/11/2024   PHOS 3.6 02/11/2024    CBC Recent Labs  Lab 02/07/24 2124 02/08/24 0534 02/09/24 0310 02/10/24 0232 02/11/24 0818  WBC 10.5   < > 9.6 9.3 8.9  NEUTROABS 7.6  --   --   --   --   HGB 10.2*   < > 9.4* 9.5* 9.7*  HCT 33.1*   < > 30.8* 30.6* 31.3*  MCV 98.5   < > 98.4 97.8 99.1  PLT 53*   < > 82* 107* 164   < > = values in this interval not displayed.

## 2024-02-11 NOTE — Progress Notes (Signed)
 IV removed and patient left AMA.

## 2024-02-11 NOTE — Plan of Care (Signed)
  Problem: Education: Goal: Knowledge of condition and prescribed therapy will improve Outcome: Progressing   

## 2024-02-11 NOTE — Procedures (Signed)
 Received patient in bed to unit.  Alert and oriented.  Informed consent signed and in chart.   TX duration: 3.5 hours Patient tolerated well.  Transported back to the room  Alert, without acute distress.  Hand-off given to patient's nurse.   Access used: right cath Access issues: none  Total UF removed: 2 liters  Lu Duffel, RN Kidney Dialysis Unit

## 2024-02-12 NOTE — Discharge Planning (Signed)
 [facility] Patient Discharge Orders- [facility] CLINIC: [facility]  Patient's name: Vanessa [name] Admit/DC Dates: 02/07/2024 - 02/11/2024  Discharge Diagnoses: ESRD on dialysis   Syncope Acute on chronic CHF  Aranesp: Given: no   Date and amount of last dose: N/A  Last Hgb: 9.7 PRBC's Given: no Date/# of units: N/A ESA dose for discharge: none IV Iron dose at discharge: none  Heparin change: no  EDW Change: Yes New EDW: 66kg  [place]ath Change: no  Access intervention/Change: no Details:  Hectorol/Calcitriol change: no  Discharge Labs: Calcium 9.1 Phosphorus 3.6  Albumin 2.9 K+ 3.7  IV Antibiotics: no Details:  On Coumadin?: Yes Last INR: 1.7 Next INR: Managed By: PCP   OTHER/APPTS/LAB ORDERS: Started on torsemide 100mg  daily    D/C Meds to be reconciled by nurse after every discharge.  Completed By: Rogers Blocker, PA-C 02/12/2024, 11:56 AM  [facility] Pager: [phone]    Reviewed by: MD:______ RN_______

## 2024-02-14 ENCOUNTER — Encounter (HOSPITAL_COMMUNITY): Payer: Self-pay

## 2024-02-14 ENCOUNTER — Other Ambulatory Visit: Payer: Self-pay

## 2024-02-14 ENCOUNTER — Emergency Department (HOSPITAL_COMMUNITY)
Admission: EM | Admit: 2024-02-14 | Discharge: 2024-02-19 | Disposition: A | Discharge status: Skilled Nursing Facility | Attending: Student | Admitting: Student

## 2024-02-14 ENCOUNTER — Emergency Department (HOSPITAL_COMMUNITY)

## 2024-02-14 DIAGNOSIS — I509 Heart failure, unspecified: Secondary | ICD-10-CM | POA: Insufficient documentation

## 2024-02-14 DIAGNOSIS — R55 Syncope and collapse: Secondary | ICD-10-CM

## 2024-02-14 DIAGNOSIS — N186 End stage renal disease: Secondary | ICD-10-CM | POA: Insufficient documentation

## 2024-02-14 DIAGNOSIS — I132 Hypertensive heart and chronic kidney disease with heart failure and with stage 5 chronic kidney disease, or end stage renal disease: Secondary | ICD-10-CM | POA: Insufficient documentation

## 2024-02-14 DIAGNOSIS — I959 Hypotension, unspecified: Secondary | ICD-10-CM | POA: Diagnosis present

## 2024-02-14 DIAGNOSIS — M6281 Muscle weakness (generalized): Secondary | ICD-10-CM | POA: Diagnosis not present

## 2024-02-14 DIAGNOSIS — Z992 Dependence on renal dialysis: Secondary | ICD-10-CM | POA: Insufficient documentation

## 2024-02-14 DIAGNOSIS — I953 Hypotension of hemodialysis: Secondary | ICD-10-CM | POA: Diagnosis not present

## 2024-02-14 DIAGNOSIS — Z7901 Long term (current) use of anticoagulants: Secondary | ICD-10-CM | POA: Insufficient documentation

## 2024-02-14 DIAGNOSIS — R262 Difficulty in walking, not elsewhere classified: Secondary | ICD-10-CM | POA: Insufficient documentation

## 2024-02-14 HISTORY — DX: End stage renal disease: N18.6

## 2024-02-14 HISTORY — DX: Dependence on renal dialysis: N18.6

## 2024-02-14 LAB — CBC WITH DIFFERENTIAL/PLATELET
Abs Immature Granulocytes: 0.05 10*3/uL (ref 0.00–0.07)
Basophils Absolute: 0.1 10*3/uL (ref 0.0–0.1)
Basophils Relative: 1 %
Eosinophils Absolute: 0 10*3/uL (ref 0.0–0.5)
Eosinophils Relative: 0 %
HCT: 34.3 % — ABNORMAL LOW (ref 36.0–46.0)
Hemoglobin: 10.6 g/dL — ABNORMAL LOW (ref 12.0–15.0)
Immature Granulocytes: 1 %
Lymphocytes Relative: 21 %
Lymphs Abs: 2 10*3/uL (ref 0.7–4.0)
MCH: 30 pg (ref 26.0–34.0)
MCHC: 30.9 g/dL (ref 30.0–36.0)
MCV: 97.2 fL (ref 80.0–100.0)
Monocytes Absolute: 0.8 10*3/uL (ref 0.1–1.0)
Monocytes Relative: 9 %
Neutro Abs: 6.2 10*3/uL (ref 1.7–7.7)
Neutrophils Relative %: 68 %
Platelets: 67 10*3/uL — ABNORMAL LOW (ref 150–400)
RBC: 3.53 MIL/uL — ABNORMAL LOW (ref 3.87–5.11)
RDW: 16.7 % — ABNORMAL HIGH (ref 11.5–15.5)
WBC: 9.2 10*3/uL (ref 4.0–10.5)
nRBC: 0 % (ref 0.0–0.2)

## 2024-02-14 LAB — COMPREHENSIVE METABOLIC PANEL
ALT: 27 U/L (ref 0–44)
AST: 31 U/L (ref 15–41)
Albumin: 2.9 g/dL — ABNORMAL LOW (ref 3.5–5.0)
Alkaline Phosphatase: 70 U/L (ref 38–126)
Anion gap: 16 — ABNORMAL HIGH (ref 5–15)
BUN: 15 mg/dL (ref 8–23)
CO2: 25 mmol/L (ref 22–32)
Calcium: 9 mg/dL (ref 8.9–10.3)
Chloride: 92 mmol/L — ABNORMAL LOW (ref 98–111)
Creatinine, Ser: 3.55 mg/dL — ABNORMAL HIGH (ref 0.44–1.00)
GFR, Estimated: 13 mL/min — ABNORMAL LOW (ref >60)
Glucose, Bld: 87 mg/dL (ref 70–99)
Potassium: 3.5 mmol/L (ref 3.5–5.1)
Sodium: 133 mmol/L — ABNORMAL LOW (ref 135–145)
Total Bilirubin: 3.9 mg/dL — ABNORMAL HIGH (ref 0.0–1.2)
Total Protein: 6.4 g/dL — ABNORMAL LOW (ref 6.5–8.1)

## 2024-02-14 LAB — BRAIN NATRIURETIC PEPTIDE: B Natriuretic Peptide: 2881.3 pg/mL — ABNORMAL HIGH (ref 0.0–100.0)

## 2024-02-14 LAB — TROPONIN I (HIGH SENSITIVITY)
Troponin I (High Sensitivity): 20 ng/L — ABNORMAL HIGH (ref <18)
Troponin I (High Sensitivity): 17 ng/L (ref <18)

## 2024-02-14 LAB — PHOSPHORUS: Phosphorus: 3.3 mg/dL (ref 2.5–4.6)

## 2024-02-14 LAB — MAGNESIUM: Magnesium: 1.8 mg/dL (ref 1.7–2.4)

## 2024-02-14 MED ORDER — MIDODRINE HCL 5 MG PO TABS: 15.0000 mg | ORAL_TABLET | Freq: Once | ORAL | Status: DC

## 2024-02-14 MED ORDER — MIDODRINE HCL 5 MG PO TABS
20.0000 mg | ORAL_TABLET | Freq: Once | ORAL | Status: AC
Start: 2024-02-14 — End: 2024-02-14
  Administered 2024-02-14: 20 mg via ORAL
  Filled 2024-02-14: qty 4

## 2024-02-14 NOTE — Discharge Instructions (Addendum)
 You were seen in the ED today after a syncopal episode while at dialysis.  Your labs are reassuring as you do not have signs of infection, stable hemoglobin, appropriate electrolytes, your renal function is appropriate given that you are on dialysis.  Ensure that you continue taking torsemide on your nondialysis days.  You should be taking 20 mg of midodrine 3 times daily.  Please ensure you are not missing these doses as these are imperative for keeping your blood pressure at an appropriate level, especially on dialysis days

## 2024-02-14 NOTE — ED Provider Notes (Signed)
 [facility] EMERGENCY DEPARTMENT AT [facility] Provider Note   CSN: [id number] Arrival date & time: 02/14/24  1538     History {Add pertinent medical, surgical, social history, OB history to HPI:1} No chief complaint on file.   Vanessa [name] is a 73 y.o. female with history as below who presents to the ED today from dialysis where she became hypotensive with a unresponsive episode.  Blood pressure with EMS was 95/58.  This episode occurred at the end of her dialysis session, approximately 30 minutes prior to completion.  Dialysis facility administered 500 mL of normal saline prior to EMS transport.  Patient reports that similar occurrence happened less than 1 week ago also at dialysis.  At that time, she did not want to stay in the hospital and signed out AMA, but is more concerned that this has happened a second time.  She has had no episodes of this prior to her open heart surgery at the end of December 2024.  Denies all symptoms at this time.  Denies lightheadedness, recent sicknesses, chest pain, shortness of breath.  Denies missing any dialysis appointments.  ESRD, atrial fibrillation on warfarin, chronic HFrEF, and mitral valve replacement and tricuspid ring on 11/29/2023 complicated by tamponade and shock   HPI     Home Medications Prior to Admission medications   Medication Sig Start Date End Date Taking? Authorizing Provider  amiodarone (PACERONE) 200 MG tablet Take 1 tablet (200 mg total) by mouth daily. 01/28/24   Love, Evlyn Kanner, PA-C  ascorbic acid (VITAMIN C) 500 MG tablet Take 0.5 tablets (250 mg total) by mouth 2 (two) times daily. 01/27/24   Love, Evlyn Kanner, PA-C  atorvastatin (LIPITOR) 10 MG tablet Take 10 mg by mouth daily. 11/25/23   [provider]  docusate sodium (COLACE) 100 MG capsule Take 2 capsules (200 mg total) by mouth 2 (two) times daily. 01/27/24   Love, Evlyn Kanner, PA-C  fludrocortisone (FLORINEF) 0.1 MG tablet Take 1 tablet (0.1 mg total)  by mouth daily. 01/28/24   Love, Evlyn Kanner, PA-C  lidocaine-prilocaine (EMLA) cream Apply 1 Application topically as needed (topical anesthesia for hemodialysis if Gebauers and Lidocaine injection are ineffective.). 01/27/24   Love, Evlyn Kanner, PA-C  midodrine (PROAMATINE) 10 MG tablet Take 2 tablets (20 mg total) by mouth 3 (three) times daily with meals. 01/28/24   Love, Evlyn Kanner, PA-C  mirtazapine (REMERON) 7.5 MG tablet Take 1 tablet (7.5 mg total) by mouth at bedtime. 01/27/24   Love, Evlyn Kanner, PA-C  multivitamin (RENA-VIT) TABS tablet Take 1 tablet by mouth 2 (two) times daily. 01/27/24   Love, Evlyn Kanner, PA-C  polyethylene glycol powder (GLYCOLAX/MIRALAX) 17 GM/SCOOP powder Take 17 g by mouth daily as needed. 01/27/24   Love, Evlyn Kanner, PA-C  senna-docusate (SENOKOT-S) 8.6-50 MG tablet Take 1 tablet by mouth at bedtime. 01/27/24   Love, Evlyn Kanner, PA-C  sertraline (ZOLOFT) 25 MG tablet Take 1 tablet (25 mg total) by mouth daily. 01/28/24   Jacquelynn Cree, PA-C  torsemide (DEMADEX) 100 MG tablet Take 1 tablet (100 mg total) by mouth every Tuesday, Thursday, and Saturday at 6 PM. 02/10/24   Zannie Cove, MD  warfarin (COUMADIN) 5 MG tablet Take 1 tablet (5 mg total) by mouth daily at 4 PM. 01/28/24   Love, Evlyn Kanner, PA-C      Allergies    Spironolactone    Review of Systems   Review of Systems  Physical Exam Updated Vital Signs  There were no vitals taken for this visit. Physical Exam Vitals and nursing note reviewed.  Constitutional:      General: She is not in acute distress.    Appearance: Normal appearance. She is not ill-appearing.  HENT:     Head: Normocephalic and atraumatic.  Cardiovascular:     Rate and Rhythm: Normal rate and regular rhythm.     Pulses:          Radial pulses are 2+ on the right side and 2+ on the left side.  Pulmonary:     Effort: Pulmonary effort is normal.     Breath sounds: Normal breath sounds. No decreased breath sounds, wheezing, rhonchi or rales.   Abdominal:     Palpations: Abdomen is soft.     Tenderness: There is no abdominal tenderness.  Skin:    Comments: Well-healing midline anterior chest scar with slight edema and tenderness to palpation at proximal end  Neurological:     Mental Status: She is alert.  Psychiatric:        Behavior: Behavior is cooperative.   ED Results / Procedures / Treatments   Labs (all labs ordered are listed, but only abnormal results are displayed) Labs Reviewed - No data to display  EKG None  Radiology No results found.  Procedures Procedures  {Document cardiac monitor, telemetry assessment procedure when appropriate:1}  Medications Ordered in ED Medications - No data to display  ED Course/ Medical Decision Making/ A&P   {   Click here for ABCD2, HEART and other calculatorsREFRESH Note before signing :1}                              Medical Decision Making Amount and/or Complexity of Data Reviewed Labs: ordered. Radiology: ordered.  Risk Prescription drug management.   ***  MIDODRINE > dosing  {Document critical care time when appropriate:1} {Document review of labs and clinical decision tools ie heart score, Chads2Vasc2 etc:1}  {Document your independent review of radiology images, and any outside records:1} {Document your discussion with family members, caretakers, and with consultants:1} {Document social determinants of health affecting pt's care:1} {Document your decision making why or why not admission, treatments were needed:1} Final Clinical Impression(s) / ED Diagnoses Final diagnoses:  None    Rx / DC Orders ED Discharge Orders     None

## 2024-02-14 NOTE — ED Triage Notes (Signed)
 Patient BIB [facility] from dialysis where she reportedly became hypotensive and unresponsive with agonal respirations per facility. Patient was A&Ox4, GCS 15 with EMS, BP 95/58. Patient has no complaints at this time. Facility administered ~572ml NS prior to EMS arrival.

## 2024-02-14 NOTE — Progress Notes (Signed)
 Late Note Entry- February 14, 2024  Noted pt left AMA on Friday. Contacted [facility] this morning to be advised of the above and that pt should resume care today.   Olivia Canter Renal Navigator [phone]

## 2024-02-14 NOTE — ED Notes (Signed)
 Pt discharged but does not want to go home, she is unable to take care of herself and there is no one there that will be able to help her.

## 2024-02-14 NOTE — Progress Notes (Signed)
 Late Entry Note; service 02/11/2024  Physical Therapy Treatment Patient Details Name: Vanessa [name] MRN: [id number] DOB: 06-27-1951 Today's Date: 02/14/2024   History of Present Illness 73 y.o. female who now presents from her dialysis center after syncopal episode. +pulmonary edema, CHF 2/2 cardiomyopathy,  PMH significant for ESRD, atrial fibrillation on warfarin, chronic HFrEF, and mitral valve replacement and tricuspid ring on 11/29/2023 complicated by tamponade and shock    PT Comments  Pt long sitting in bed on arrival and agreeable to session with continued progress towards acute goals. Pt asymptomatic throughout session with no reports of dizziness and BP stable with positional changes this session. Pt demonstrating gait with RW support for short in room distance at grossly CGA for safety. Pt was educated on continued walker use to maximize functional independence, safety, and decrease risk for falls.  Pt continues to benefit from skilled PT services to progress toward functional mobility goals.     If plan is discharge home, recommend the following: A little help with walking and/or transfers   Can travel by private vehicle        Equipment Recommendations  None recommended by PT    Recommendations for Other Services OT consult     Precautions / Restrictions Precautions Precautions: Fall Precaution/Restrictions Comments: monitor BP Restrictions Weight Bearing Restrictions Per Provider Order: No     Mobility  Bed Mobility Overal bed mobility: Modified Independent             General bed mobility comments: incr time and effort; no use of rails    Transfers Overall transfer level: Needs assistance Equipment used: Rolling walker (2 wheels) Transfers: Sit to/from Stand Sit to Stand: Contact guard assist   Step pivot transfers: Min assist       General transfer comment: CGA for safety, cues for hand placement    Ambulation/Gait Ambulation/Gait assistance:  Contact guard assist   Assistive device: Rolling walker (2 wheels) Gait Pattern/deviations: Step-through pattern Gait velocity: decreased     General Gait Details: pt asymptommatic throughout, BP post ambultaion 106/71   Stairs             Wheelchair Mobility     Tilt Bed    Modified Rankin (Stroke Patients Only)       Balance Overall balance assessment: Needs assistance Sitting-balance support: No upper extremity supported, Feet unsupported Sitting balance-Leahy Scale: Good     Standing balance support: Bilateral upper extremity supported, Reliant on assistive device for balance Standing balance-Leahy Scale: Poor Standing balance comment: able to relase grasp of RW for pulling up pants in standing, reliant on RW support during dynamic activities                            Communication Communication Communication: No apparent difficulties  Cognition Arousal: Alert Behavior During Therapy: WFL for tasks assessed/performed   PT - Cognitive impairments: No apparent impairments                         Following commands: Intact      Cueing Cueing Techniques: Verbal cues  Exercises      General Comments General comments (skin integrity, edema, etc.): BP stable, 106/71 post ambulation      Pertinent Vitals/Pain      Home Living  Prior Function            PT Goals (current goals can now be found in the care plan section) Acute Rehab PT Goals Patient Stated Goal: go home today PT Goal Formulation: With patient Time For Goal Achievement: 02/24/24 Potential to Achieve Goals: Fair Progress towards PT goals: Progressing toward goals    Frequency    Min 2X/week      PT Plan      Co-evaluation              AM-PAC PT "6 Clicks" Mobility   Outcome Measure  Help needed turning from your back to your side while in a flat bed without using bedrails?: None Help needed moving from  lying on your back to sitting on the side of a flat bed without using bedrails?: None Help needed moving to and from a bed to a chair (including a wheelchair)?: A Little Help needed standing up from a chair using your arms (e.g., wheelchair or bedside chair)?: A Little Help needed to walk in hospital room?: A Little Help needed climbing 3-5 steps with a railing? : Total 6 Click Score: 18    End of Session   Activity Tolerance: Patient tolerated treatment well Patient left: in chair;with call bell/phone within reach;with nursing/sitter in room Nurse Communication: Mobility status PT Visit Diagnosis: Difficulty in walking, not elsewhere classified (R26.2)     Time: 6962-9528 PT Time Calculation (min) (ACUTE ONLY): 15 min  Charges:    $Therapeutic Activity: 8-22 mins PT General Charges $$ ACUTE PT VISIT: 1 Visit                     Tobi Bastos R. PTA Acute Rehabilitation Services Office: [phone]   Catalina Antigua 02/14/2024, 8:12 AM

## 2024-02-15 LAB — PROTIME-INR
INR: 1.8 — ABNORMAL HIGH (ref 0.8–1.2)
Prothrombin Time: 21.5 s — ABNORMAL HIGH (ref 11.4–15.2)

## 2024-02-15 MED ORDER — FLUDROCORTISONE ACETATE 0.1 MG PO TABS
0.1000 mg | ORAL_TABLET | Freq: Every day | ORAL | Status: DC
  Administered 2024-02-15 – 2024-02-19 (×5): 0.1 mg via ORAL
  Filled 2024-02-15 (×5): qty 1

## 2024-02-15 MED ORDER — SERTRALINE HCL 50 MG PO TABS
25.0000 mg | ORAL_TABLET | Freq: Every day | ORAL | Status: DC
  Administered 2024-02-15 – 2024-02-19 (×5): 25 mg via ORAL
  Filled 2024-02-15 (×5): qty 1

## 2024-02-15 MED ORDER — SENNOSIDES-DOCUSATE SODIUM 8.6-50 MG PO TABS
1.0000 | ORAL_TABLET | Freq: Every day | ORAL | Status: DC
Start: 2024-02-15 — End: 2024-02-19
  Administered 2024-02-16 – 2024-02-18 (×3): 1 via ORAL
  Filled 2024-02-15 (×4): qty 1

## 2024-02-15 MED ORDER — ATORVASTATIN CALCIUM 10 MG PO TABS
10.0000 mg | ORAL_TABLET | Freq: Every day | ORAL | Status: DC
  Administered 2024-02-15 – 2024-02-19 (×5): 10 mg via ORAL
  Filled 2024-02-15 (×5): qty 1

## 2024-02-15 MED ORDER — AMIODARONE HCL 200 MG PO TABS: 200.0000 mg | ORAL_TABLET | Freq: Every day | ORAL | Status: DC

## 2024-02-15 MED ORDER — WARFARIN SODIUM 5 MG PO TABS: 5.0000 mg | ORAL_TABLET | Freq: Every day | ORAL | Status: DC

## 2024-02-15 MED ORDER — SENNOSIDES-DOCUSATE SODIUM 8.6-50 MG PO TABS: 1.0000 | ORAL_TABLET | Freq: Every day | ORAL | Status: DC

## 2024-02-15 MED ORDER — MIRTAZAPINE 15 MG PO TABS: 7.5000 mg | ORAL_TABLET | Freq: Every day | ORAL | Status: DC

## 2024-02-15 MED ORDER — RENA-VITE PO TABS
1.0000 | ORAL_TABLET | Freq: Two times a day (BID) | ORAL | Status: DC
  Filled 2024-02-15: qty 1

## 2024-02-15 MED ORDER — DOCUSATE SODIUM 100 MG PO CAPS
200.0000 mg | ORAL_CAPSULE | Freq: Two times a day (BID) | ORAL | Status: DC
  Administered 2024-02-15 – 2024-02-19 (×7): 200 mg via ORAL
  Filled 2024-02-15 (×8): qty 2

## 2024-02-15 MED ORDER — MIRTAZAPINE 15 MG PO TABS
7.5000 mg | ORAL_TABLET | Freq: Every day | ORAL | Status: DC
  Administered 2024-02-15 – 2024-02-18 (×4): 7.5 mg via ORAL
  Filled 2024-02-15 (×4): qty 1

## 2024-02-15 MED ORDER — WARFARIN - PHARMACIST DOSING INPATIENT: Freq: Every day | Status: DC

## 2024-02-15 MED ORDER — FLUDROCORTISONE ACETATE 0.1 MG PO TABS: 0.1000 mg | ORAL_TABLET | Freq: Every day | ORAL | Status: DC

## 2024-02-15 MED ORDER — MIDODRINE HCL 5 MG PO TABS
20.0000 mg | ORAL_TABLET | Freq: Three times a day (TID) | ORAL | Status: DC
  Administered 2024-02-15 – 2024-02-19 (×13): 20 mg via ORAL
  Filled 2024-02-15 (×13): qty 4

## 2024-02-15 MED ORDER — TORSEMIDE 20 MG PO TABS: 100.0000 mg | ORAL_TABLET | ORAL | Status: DC

## 2024-02-15 MED ORDER — POLYETHYLENE GLYCOL 3350 17 G PO PACK: 17.0000 g | PACK | Freq: Every day | ORAL | Status: DC | PRN

## 2024-02-15 MED ORDER — WARFARIN SODIUM 10 MG PO TABS
10.0000 mg | ORAL_TABLET | Freq: Once | ORAL | Status: AC
  Administered 2024-02-15: 10 mg via ORAL
  Filled 2024-02-15: qty 1

## 2024-02-15 MED ORDER — TORSEMIDE 20 MG PO TABS
100.0000 mg | ORAL_TABLET | ORAL | Status: DC
  Administered 2024-02-15 – 2024-02-17 (×2): 100 mg via ORAL
  Filled 2024-02-15 (×2): qty 5

## 2024-02-15 MED ORDER — AMIODARONE HCL 200 MG PO TABS
200.0000 mg | ORAL_TABLET | Freq: Every day | ORAL | Status: DC
  Administered 2024-02-15 – 2024-02-19 (×5): 200 mg via ORAL
  Filled 2024-02-15 (×5): qty 1

## 2024-02-15 MED ORDER — ATORVASTATIN CALCIUM 10 MG PO TABS: 10.0000 mg | ORAL_TABLET | Freq: Every day | ORAL | Status: DC

## 2024-02-15 MED ORDER — SERTRALINE HCL 50 MG PO TABS: 25.0000 mg | ORAL_TABLET | Freq: Every day | ORAL | Status: DC

## 2024-02-15 MED ORDER — WARFARIN SODIUM 5 MG PO TABS
5.0000 mg | ORAL_TABLET | Freq: Once | ORAL | Status: AC
  Administered 2024-02-15: 5 mg via ORAL
  Filled 2024-02-15: qty 1

## 2024-02-15 MED ORDER — RENA-VITE PO TABS
1.0000 | ORAL_TABLET | Freq: Two times a day (BID) | ORAL | Status: DC
Start: 2024-02-15 — End: 2024-02-19
  Administered 2024-02-15 – 2024-02-19 (×8): 1 via ORAL
  Filled 2024-02-15 (×14): qty 1

## 2024-02-15 NOTE — Progress Notes (Signed)
 PHARMACY - ANTICOAGULATION CONSULT NOTE  Pharmacy Consult for warfarin Indication: atrial fibrillation, MVR  Allergies  Allergen Reactions   Spironolactone     hyperkalemia   Vital Signs: Temp: 97.9 F (36.6 C) (03/17 2259) Temp Source: Oral (03/17 2259) BP: 90/60 (03/17 2259) Pulse Rate: 85 (03/17 2259)  Labs: Recent Labs    02/14/24 1806 02/14/24 1931  HGB  --  10.6*  HCT  --  34.3*  PLT  --  67*  CREATININE  --  3.55*  TROPONINIHS 20* 17    Estimated Creatinine Clearance: 13.1 mL/min (A) (by C-G formula based on SCr of 3.55 mg/dL (H)).   Medical History: Past Medical History:  Diagnosis Date   CHF (congestive heart failure) (HCC)    CKD (chronic kidney disease)    Heart murmur    Hypertension    Moderate mitral regurgitation    moderate to severe MR with moderate pulmonary HTN   Multinodular goiter    Nonischemic cardiomyopathy (HCC)    EF 30-35% by echo 2015   Pulmonary HTN (HCC)    moderate with PASP by echo 2015   PVC's (premature ventricular contractions)    Assessment: Vanessa [name] is a 73 y.o. year old female presented on 02/14/2024 with concern for hypotension while at dialysis. On warfarin prior to admission for afib and MVR. Prior to admission regimen, 5mg  daily (total weekly dose 35mg ). Last dose prior to admission 3/16. Pharmacy consulted for warfarin inpatient management.   Date INR Warfarin Dose  3/18  1.8, subtherapeutic  10mg  (planned)     Goal of Therapy:  INR 2-3 Monitor platelets by anticoagulation protocol: Yes   Plan:  Warfarin 10mg  x 1  F/u INR, CBC and s/sx of bleeding  Thank you for allowing pharmacy to participate in this patient's care.  Marja Kays, PharmD Emergency Medicine Clinical Pharmacist 02/15/2024,2:20 AM

## 2024-02-15 NOTE — Progress Notes (Signed)
 PHARMACY - ANTICOAGULATION CONSULT NOTE  Pharmacy Consult for warfarin Indication: atrial fibrillation, MVR  Allergies  Allergen Reactions   Spironolactone     hyperkalemia   Vital Signs: Temp: 97.6 F (36.4 C) (03/18 0817) Temp Source: Oral (03/18 0817) BP: 99/66 (03/18 0800) Pulse Rate: 78 (03/18 0800)  Labs: Recent Labs    02/14/24 1806 02/14/24 1931 02/15/24 0135  HGB  --  10.6*  --   HCT  --  34.3*  --   PLT  --  67*  --   LABPROT  --   --  21.5*  INR  --   --  1.8*  CREATININE  --  3.55*  --   TROPONINIHS 20* 17  --     Estimated Creatinine Clearance: 13.1 mL/min (A) (by C-G formula based on SCr of 3.55 mg/dL (H)).   Medical History: Past Medical History:  Diagnosis Date   CHF (congestive heart failure) (HCC)    CKD (chronic kidney disease)    Heart murmur    Hypertension    Moderate mitral regurgitation    moderate to severe MR with moderate pulmonary HTN   Multinodular goiter    Nonischemic cardiomyopathy (HCC)    EF 30-35% by echo 2015   Pulmonary HTN (HCC)    moderate with PASP by echo 2015   PVC's (premature ventricular contractions)    Assessment: Vanessa [name] is a 73 y.o. year old female presented on 02/14/2024 with concern for hypotension while at dialysis. On warfarin prior to admission for afib and MVR. Prior to admission regimen, 5mg  daily (total weekly dose 35mg ). Last dose prior to admission 3/16. Pharmacy consulted for warfarin inpatient management.   Overnight, patient received one warfarin 10mg  dose since INR subtherapeutic and no home dose taken 3/17. Will resume home warfarin 5mg  this evening.    Goal of Therapy:  INR 2-3 Monitor platelets by anticoagulation protocol: Yes   Plan:  Warfarin 5mg  x 1  F/u INR, CBC and s/sx of bleeding   Thank you for allowing pharmacy to participate in this patient's care.  Vanessa [name], PharmD Clinical Pharmacist 02/15/2024 8:51 AM Please check AMION for all [name] Pharm[facility]  numbers

## 2024-02-15 NOTE — ED Notes (Signed)
 Provider notified pt stating that she has not had her other home medications

## 2024-02-15 NOTE — Progress Notes (Signed)
 Transition of Care [facility]) - Emergency Department Mini Assessment   Patient Details  Name: Vanessa [name] MRN: [id number] Date of Birth: Jun 12, 1951  Transition of Care [facility]) CM/SW Contact:    Oletta Cohn, RN Phone Number: 02/15/2024, 10:08 AM   Clinical Narrative: RNCM consulted regarding safe discharge planning (Home with Home Health vs Skilled Nursing Facility Placement).   Pt receives dialysis at [facility]) and has rolling walker at home to aid with mobility. Physical Therapy evaluation placed; will follow up after recommendations from PT.      ED Mini Assessment: What brought you to the Emergency Department? : (P) home alone, "[place] and I won't have any help one they leave"  Barriers to Discharge: (P) Continued Medical Work up        Interventions which prevented an admission or readmission: (P) SNF Placement    Patient Contact and Communications        ,          Patient states their goals for this hospitalization and ongoing recovery are:: (P) get stronger before I go home alone      Admission diagnosis:  Dialysis:Hypotension Patient Active Problem List   Diagnosis Date Noted   Depression 02/08/2024   Dyslipidemia 02/08/2024   Hypoxia 02/08/2024   Valvular cardiomyopathy (HCC) 02/08/2024   Syncope 02/07/2024   Acute respiratory failure with hypoxia (HCC) 02/07/2024   Thrombocytopenia (HCC) 02/07/2024   Atrial fibrillation (HCC) 02/01/2024   Long term (current) use of anticoagulants 02/01/2024   ESRD on dialysis (HCC) 01/12/2024   Debility 01/12/2024   Tricuspid valve insufficiency 12/30/2023   Insomnia 12/29/2023   Anemia 12/29/2023   Severe protein-energy malnutrition (HCC) 12/29/2023   Idiopathic hypotension 12/29/2023   Hypotension due to hypovolemia 12/28/2023   Reactive depression 12/17/2023   Pulmonary hypertension (HCC) 12/17/2023   Shock (HCC) 12/16/2023   Physical deconditioning 12/16/2023   S/P mitral  valve replacement 11/29/2023   AKI (acute kidney injury) (HCC) 07/24/2022   Hyperkalemia 07/24/2022   Hyperthyroidism 01/10/2022   Pulmonary HTN (HCC)    PVC (premature ventricular contraction) 02/20/2014   Bruit 02/20/2014   Severe mitral insufficiency 02/20/2014   Postmenopausal bleeding 12/02/2012   Endometrial polyp 12/02/2012   Nonischemic dilated cardiomyopathy (HCC) 10/21/2012   OBESITY 01/20/2008   Essential hypertension 01/20/2008   Acute on chronic diastolic CHF (congestive heart failure) (HCC) 01/20/2008   PCP:  Milus Height, PA Pharmacy:   [facility] - [place]), [place] - [address] [place] ([place]) [place] [address] Phone: [phone] Fax: [phone]

## 2024-02-15 NOTE — Evaluation (Signed)
 Physical Therapy Evaluation Patient Details Name: Vanessa [name] MRN: [id number] DOB: March 29, 1951 Today's Date: 02/15/2024  History of Present Illness  73 y.o. female who presents 02/14/24 from her dialysis center after syncopal episode. Just discharged 02/11/24.  PMH: ESRD, atrial fibrillation on warfarin, chronic HFrEF, and mitral valve replacement and tricuspid ring on 11/29/2023 complicated by tamponade and shock  Clinical Impression  Pt admitted with above diagnosis. Pt just d/c home and returns after hypotensive episode at HD. Pt has had assist from family at home but family is now going back to their homes. Pt has had multiple admissions in past 3 mos and presents with decreased strength and balance, needed mod A to stand and min A to safely ambulate with RW. Pt currently with very high fall risk and unsafe to be home alone. Patient will benefit from continued inpatient follow up therapy, <3 hours/day.  Pt currently with functional limitations due to the deficits listed below (see PT Problem List). Pt will benefit from acute skilled PT to increase their independence and safety with mobility to allow discharge.           If plan is discharge home, recommend the following: A lot of help with walking and/or transfers;Help with stairs or ramp for entrance;A little help with bathing/dressing/bathroom;Assistance with cooking/housework;Assist for transportation   Can travel by private vehicle   Yes    Equipment Recommendations None recommended by PT  Recommendations for Other Services  OT consult    Functional Status Assessment Patient has had a recent decline in their functional status and demonstrates the ability to make significant improvements in function in a reasonable and predictable amount of time.     Precautions / Restrictions Precautions Precautions: Fall Precaution/Restrictions Comments: monitor BP Restrictions Weight Bearing Restrictions Per Provider Order: No       Mobility  Bed Mobility Overal bed mobility: Needs Assistance Bed Mobility: Supine to Sit     Supine to sit: Min assist     General bed mobility comments: incr time and effort, needed L rail, min A for elevation of trunk to sitting from elevated HOB    Transfers Overall transfer level: Needs assistance Equipment used: Rolling walker (2 wheels) Transfers: Sit to/from Stand, Bed to chair/wheelchair/BSC Sit to Stand: Mod assist   Step pivot transfers: Min assist       General transfer comment: pt wanted to try to stand by herself so was allowed to attempt but pt was unable to achieve standing. Needed mod A for power up. Once up was able to step to chair with RW and min A for safety. Noted shaking in UE's and LE's    Ambulation/Gait Ambulation/Gait assistance: Min assist Gait Distance (Feet): 3 Feet Assistive device: Rolling walker (2 wheels) Gait Pattern/deviations: Step-to pattern, Trunk flexed Gait velocity: decreased Gait velocity interpretation: <1.31 ft/sec, indicative of household ambulator   General Gait Details: trunk flexed, smal steps  Stairs            Wheelchair Mobility     Tilt Bed    Modified Rankin (Stroke Patients Only)       Balance Overall balance assessment: Needs assistance Sitting-balance support: No upper extremity supported, Feet unsupported Sitting balance-Leahy Scale: Fair     Standing balance support: Bilateral upper extremity supported, Reliant on assistive device for balance Standing balance-Leahy Scale: Poor Standing balance comment: reliant on RW support during dynamic activities  Pertinent Vitals/Pain Pain Assessment Pain Assessment: No/denies pain    Home Living Family/patient expects to be discharged to:: Private residence Living Arrangements: Alone Available Help at Discharge: Family;Available PRN/intermittently Type of Home: House Home Access: Level entry       Home  Layout: One level Home Equipment: BSC/3in1;Rolling Walker (2 wheels);Wheelchair - manual Additional Comments: uses w/c to HD, sponge bathes due to HD catheter, keeps chair or bed behind her during standing ADLs. Sisters have been here helping her but family has to go back home now    Prior Function Prior Level of Function : Needs assist             Mobility Comments: using RW ADLs Comments: assisted for LB ADLs     Extremity/Trunk Assessment   Upper Extremity Assessment Upper Extremity Assessment: Generalized weakness    Lower Extremity Assessment Lower Extremity Assessment: Generalized weakness (LE weakness > UE weakness)    Cervical / Trunk Assessment Cervical / Trunk Assessment: Normal  Communication   Communication Communication: No apparent difficulties    Cognition Arousal: Alert Behavior During Therapy: WFL for tasks assessed/performed   PT - Cognitive impairments: No apparent impairments                         Following commands: Intact       Cueing Cueing Techniques: Verbal cues     General Comments General comments (skin integrity, edema, etc.): BP 101/70, SPO2 in 90's    Exercises     Assessment/Plan    PT Assessment Patient needs continued PT services  PT Problem List Decreased strength;Decreased activity tolerance;Decreased balance;Decreased mobility       PT Treatment Interventions DME instruction;Gait training;Functional mobility training;Therapeutic activities;Therapeutic exercise;Balance training;Patient/family education    PT Goals (Current goals can be found in the Care Plan section)  Acute Rehab PT Goals Patient Stated Goal: agreeable to rehab PT Goal Formulation: With patient Time For Goal Achievement: 02/29/24 Potential to Achieve Goals: Fair    Frequency Min 1X/week     Co-evaluation               AM-PAC PT "6 Clicks" Mobility  Outcome Measure Help needed turning from your back to your side while in a  flat bed without using bedrails?: None Help needed moving from lying on your back to sitting on the side of a flat bed without using bedrails?: A Little Help needed moving to and from a bed to a chair (including a wheelchair)?: A Lot Help needed standing up from a chair using your arms (e.g., wheelchair or bedside chair)?: A Lot Help needed to walk in hospital room?: A Little Help needed climbing 3-5 steps with a railing? : Total 6 Click Score: 15    End of Session   Activity Tolerance: Patient tolerated treatment well Patient left: in chair;with call bell/phone within reach;with family/visitor present Nurse Communication: Mobility status PT Visit Diagnosis: Difficulty in walking, not elsewhere classified (R26.2);Muscle weakness (generalized) (M62.81)    Time: 8657-8469 PT Time Calculation (min) (ACUTE ONLY): 17 min   Charges:   PT Evaluation $PT Eval Moderate Complexity: 1 Mod   PT General Charges $$ ACUTE PT VISIT: 1 Visit         Lyanne Co, PT  Acute Rehab Services Secure chat preferred Office [phone]   Lawana Chambers Shritha Bresee 02/15/2024, 1:39 PM

## 2024-02-15 NOTE — TOC Initial Note (Signed)
 Transition of Care [facility]) - Initial/Assessment Note    Patient Details  Name: Vanessa [name] MRN: [id number] Date of Birth: 01-29-1951  Transition of Care [facility]) CM/SW Contact:    Lucretia Field, LCSW Phone Number: 02/15/2024, 9:48 PM  Clinical Narrative:                 Patient came from home, she lives alone, she has no children but she does have supportive family including nephew that assist her when needed. Patient has dx of ESRD, attends dialysis MWF and uses [place] transportation. Patient is independent at baseline and uses a walker.     Barriers to Discharge: Continued Medical Work up   Patient Goals and CMS Choice Patient states their goals for this hospitalization and ongoing recovery are:: get stronger before I go home alone          Expected Discharge Plan and Services Skilled Nursing Facility.                                             Prior Living Arrangements/Services                      Activities of Daily Living      Permission Sought/Granted                  Emotional Assessment              Admission diagnosis:  Dialysis:Hypotension Patient Active Problem List   Diagnosis Date Noted   Depression 02/08/2024   Dyslipidemia 02/08/2024   Hypoxia 02/08/2024   Valvular cardiomyopathy (HCC) 02/08/2024   Syncope 02/07/2024   Acute respiratory failure with hypoxia (HCC) 02/07/2024   Thrombocytopenia (HCC) 02/07/2024   Atrial fibrillation (HCC) 02/01/2024   Long term (current) use of anticoagulants 02/01/2024   ESRD on dialysis (HCC) 01/12/2024   Debility 01/12/2024   Tricuspid valve insufficiency 12/30/2023   Insomnia 12/29/2023   Anemia 12/29/2023   Severe protein-energy malnutrition (HCC) 12/29/2023   Idiopathic hypotension 12/29/2023   Hypotension due to hypovolemia 12/28/2023   Reactive depression 12/17/2023   Pulmonary hypertension (HCC) 12/17/2023   Shock (HCC) 12/16/2023   Physical deconditioning 12/16/2023    S/P mitral valve replacement 11/29/2023   AKI (acute kidney injury) (HCC) 07/24/2022   Hyperkalemia 07/24/2022   Hyperthyroidism 01/10/2022   Pulmonary HTN (HCC)    PVC (premature ventricular contraction) 02/20/2014   Bruit 02/20/2014   Severe mitral insufficiency 02/20/2014   Postmenopausal bleeding 12/02/2012   Endometrial polyp 12/02/2012   Nonischemic dilated cardiomyopathy (HCC) 10/21/2012   OBESITY 01/20/2008   Essential hypertension 01/20/2008   Acute on chronic diastolic CHF (congestive heart failure) (HCC) 01/20/2008   PCP:  Milus Height, PA Pharmacy:   [facility] [address] - [place] ([place]), [place] - [address] [place] ([place]) [place] [address] Phone: [phone] Fax: [phone]     Social Drivers of Health (SDOH) Social History: SDOH Screenings   Food Insecurity: No Food Insecurity (02/08/2024)  Housing: Low Risk  (02/08/2024)  Transportation Needs: No Transportation Needs (02/08/2024)  Utilities: Not At Risk (02/08/2024)  Social Connections: Socially Isolated (02/08/2024)  Tobacco Use: Low Risk  (02/14/2024)   SDOH Interventions:     Readmission Risk Interventions    01/12/2024    3:47 PM  Readmission Risk Prevention Plan  Transportation Screening Complete  HRI or Home Care Consult Complete  Social Work Consult for Recovery Care Planning/Counseling Complete  Palliative Care Screening Not Applicable  Medication Review (RN Care Manager) Complete   Lily Peer, MSW, LCSWA Transition of Care  Clinical Social Worker (ED 3-11 Mon-Fri)  [phone]

## 2024-02-15 NOTE — Progress Notes (Signed)
Awaiting PT.  

## 2024-02-15 NOTE — TOC CM/SW Note (Signed)
 SW submitted SNF referrals for patient, completed FL2 and PASSAR#[phone] [id number]. Patient provided approval to contact her nephew Doneta Public ([phone]) to discuss bed offers and keep him in the loop.   Lily Peer, MSW, LCSWA Transition of Care  Clinical Social Worker (ED 3-11 Mon-Fri)  [phone]

## 2024-02-15 NOTE — NC FL2 (Signed)
 [place] MEDICAID FL2 LEVEL OF CARE FORM     IDENTIFICATION  Patient Name: Vanessa [name] Birthdate: Oct 02, 1951 Sex: female Admission Date (Current Location): 02/14/2024  [facility] and [place] Number:  Producer, television/film/video and Address:         Provider Number:    Attending Physician Name and Address:  System, Provider Not In  Relative Name and Phone Number:  Doneta Public [phone]    Current Level of Care: Hospital Recommended Level of Care: Skilled Nursing Facility Prior Approval Number:    Date Approved/Denied:   PASRR Number: [id number]  Discharge Plan: SNF    Current Diagnoses: Patient Active Problem List   Diagnosis Date Noted   Depression 02/08/2024   Dyslipidemia 02/08/2024   Hypoxia 02/08/2024   Valvular cardiomyopathy (HCC) 02/08/2024   Syncope 02/07/2024   Acute respiratory failure with hypoxia (HCC) 02/07/2024   Thrombocytopenia (HCC) 02/07/2024   Atrial fibrillation (HCC) 02/01/2024   Long term (current) use of anticoagulants 02/01/2024   ESRD on dialysis (HCC) 01/12/2024   Debility 01/12/2024   Tricuspid valve insufficiency 12/30/2023   Insomnia 12/29/2023   Anemia 12/29/2023   Severe protein-energy malnutrition (HCC) 12/29/2023   Idiopathic hypotension 12/29/2023   Hypotension due to hypovolemia 12/28/2023   Reactive depression 12/17/2023   Pulmonary hypertension (HCC) 12/17/2023   Shock (HCC) 12/16/2023   Physical deconditioning 12/16/2023   S/P mitral valve replacement 11/29/2023   AKI (acute kidney injury) (HCC) 07/24/2022   Hyperkalemia 07/24/2022   Hyperthyroidism 01/10/2022   Pulmonary HTN (HCC)    PVC (premature ventricular contraction) 02/20/2014   Bruit 02/20/2014   Severe mitral insufficiency 02/20/2014   Postmenopausal bleeding 12/02/2012   Endometrial polyp 12/02/2012   Nonischemic dilated cardiomyopathy (HCC) 10/21/2012   OBESITY 01/20/2008   Essential hypertension 01/20/2008   Acute on chronic diastolic CHF  (congestive heart failure) (HCC) 01/20/2008    Orientation RESPIRATION BLADDER Height & Weight     Self, Time, Situation  Normal Continent Weight: 145 lb 11.6 oz (66.1 kg) Height:  5\' 3"  (160 cm)  BEHAVIORAL SYMPTOMS/MOOD NEUROLOGICAL BOWEL NUTRITION STATUS      Continent    AMBULATORY STATUS COMMUNICATION OF NEEDS Skin   Limited Assist Verbally Normal                       Personal Care Assistance Level of Assistance  Bathing, Dressing Bathing Assistance: Limited assistance   Dressing Assistance: Limited assistance     Functional Limitations Info             SPECIAL CARE FACTORS FREQUENCY  PT (By licensed PT)     PT Frequency: 5x/weekly              Contractures Contractures Info: Not present    Additional Factors Info                  Current Medications (02/15/2024):  This is the current hospital active medication list Current Facility-Administered Medications  Medication Dose Route Frequency Provider Last Rate Last Admin   amiodarone (PACERONE) tablet 200 mg  200 mg Oral Daily Tegeler, Canary Brim, MD   200 mg at 02/15/24 1512   atorvastatin (LIPITOR) tablet 10 mg  10 mg Oral Daily Tegeler, Canary Brim, MD   10 mg at 02/15/24 1511   docusate sodium (COLACE) capsule 200 mg  200 mg Oral BID Sabas Sous, MD   200 mg at 02/15/24 2153   fludrocortisone (FLORINEF) tablet 0.1 mg  0.1 mg Oral Daily Tegeler, Canary Brim, MD   0.1 mg at 02/15/24 1511   midodrine (PROAMATINE) tablet 20 mg  20 mg Oral TID WC Sabas Sous, MD   20 mg at 02/15/24 1731   mirtazapine (REMERON) tablet 7.5 mg  7.5 mg Oral QHS Tegeler, Canary Brim, MD   7.5 mg at 02/15/24 2153   multivitamin (RENA-VIT) tablet 1 tablet  1 tablet Oral BID Tegeler, Canary Brim, MD   1 tablet at 02/15/24 2153   polyethylene glycol (MIRALAX / GLYCOLAX) packet 17 g  17 g Oral Daily PRN Sabas Sous, MD       senna-docusate (Senokot-S) tablet 1 tablet  1 tablet Oral QHS Tegeler,  Canary Brim, MD       sertraline (ZOLOFT) tablet 25 mg  25 mg Oral Daily Tegeler, Canary Brim, MD   25 mg at 02/15/24 1512   torsemide (DEMADEX) tablet 100 mg  100 mg Oral Q T,Th,Sat-1800 Tegeler, Canary Brim, MD   100 mg at 02/15/24 1731   Warfarin - Pharmacist Dosing Inpatient   Does not apply q1600 Sabas Sous, MD   Given at 02/15/24 1733   Current Outpatient Medications  Medication Sig Dispense Refill   amiodarone (PACERONE) 200 MG tablet Take 1 tablet (200 mg total) by mouth daily. 30 tablet 0   ascorbic acid (VITAMIN C) 500 MG tablet Take 0.5 tablets (250 mg total) by mouth 2 (two) times daily. 30 tablet 0   atorvastatin (LIPITOR) 10 MG tablet Take 10 mg by mouth daily.     docusate sodium (COLACE) 100 MG capsule Take 2 capsules (200 mg total) by mouth 2 (two) times daily. (Patient taking differently: Take 100 mg by mouth daily as needed for mild constipation.) 60 capsule 0   fludrocortisone (FLORINEF) 0.1 MG tablet Take 1 tablet (0.1 mg total) by mouth daily. 30 tablet 0   midodrine (PROAMATINE) 10 MG tablet Take 2 tablets (20 mg total) by mouth 3 (three) times daily with meals. 180 tablet 0   mirtazapine (REMERON) 7.5 MG tablet Take 1 tablet (7.5 mg total) by mouth at bedtime. 30 tablet 0   multivitamin (RENA-VIT) TABS tablet Take 1 tablet by mouth 2 (two) times daily. 30 tablet 0   polyethylene glycol powder (GLYCOLAX/MIRALAX) 17 GM/SCOOP powder Take 17 g by mouth daily as needed. 476 g 0   sertraline (ZOLOFT) 25 MG tablet Take 1 tablet (25 mg total) by mouth daily. 30 tablet 0   torsemide (DEMADEX) 100 MG tablet Take 1 tablet (100 mg total) by mouth every Tuesday, Thursday, and Saturday at 6 PM. 20 tablet 1   warfarin (COUMADIN) 5 MG tablet Take 1 tablet (5 mg total) by mouth daily at 4 PM. 30 tablet 0   lidocaine-prilocaine (EMLA) cream Apply 1 Application topically as needed (topical anesthesia for hemodialysis if Gebauers and Lidocaine injection are ineffective.). (Patient  not taking: Reported on 02/15/2024) 30 g 0   senna-docusate (SENOKOT-S) 8.6-50 MG tablet Take 1 tablet by mouth at bedtime. (Patient not taking: Reported on 02/15/2024) 30 tablet 0     Discharge Medications: Please see discharge summary for a list of discharge medications.  Relevant Imaging Results:  Relevant Lab Results:   Additional Information  [id number]  Lily Peer, MSW, LCSWA Transition of Care  Clinical Social Worker (ED 3-11 Mon-Fri)  [phone]

## 2024-02-15 NOTE — ED Provider Notes (Signed)
 Emergency Medicine Observation Re-evaluation Note  Vanessa [name] is a 73 y.o. female, seen on rounds today.  Pt initially presented to the ED for complaints of Hypotension Currently, the patient is awaiting transition of care evaluation to determine disposition.  Physical Exam  BP 100/61   Pulse 78   Temp 98 F (36.7 C) (Oral)   Resp 18   Ht 5\' 3"  (1.6 m)   Wt 66.1 kg   SpO2 93%   BMI 25.81 kg/m  Physical Exam General: Resting in bed without distress Cardiac: Not tachycardic on last vital signs Lungs: Lungs are clear bilaterally Psych: No agitation  ED Course / MDM  EKG:EKG Interpretation Date/Time:  Monday February 14 2024 16:12:01 EDT Ventricular Rate:  83 PR Interval:  221 QRS Duration:  124 QT Interval:  427 QTC Calculation: 502 R Axis:   70  Text Interpretation: Sinus rhythm Prolonged PR interval Confirmed by Kommor, Madison ([id number]) on 02/14/2024 8:30:55 PM  I have reviewed the labs performed to date as well as medications administered while in observation.  Recent changes in the last 24 hours include had medical workup and was felt to be likely stable for discharge however due to patient's and family concerned about her being at home alone she was moved to be a boarder until seen by transition of care to discuss a plan.  Plan  Current plan is for waiting for transition of care evaluation and recommendations.Rush Landmark, Canary Brim, MD 02/15/24 [phone]

## 2024-02-15 NOTE — ED Notes (Signed)
 Pt transitioned to a hospital bed for comfort. Call light and belongings within reach. Warm blankets provided. Vitals WNL. Denies further needs at this time.

## 2024-02-15 NOTE — ED Provider Notes (Signed)
  Provider Note MRN:  [id number]  Arrival date & time: 02/15/24    ED Course and Medical Decision Making  Assumed care of patient at sign-out or upon transfer.  Patient was pending discharge from the day team.  Observed after episode of hypotension and syncope at dialysis.  Blood pressures have been at her baseline.  Uses midodrine.  She is recovering from mitral valve replacement back in December.  Sounds like the surgery was complicated by cardiogenic/septic shock afterwards.  She is new to dialysis as of December 2024.  Workup today is overall reassuring and her vitals are reassuring.  Does seem appropriate for discharge, well-appearing on my assessment.  However she explains that her family is leaving tomorrow and she will not have anyone helping her in her private residence.  She still cannot stand on her own from a seated position.  She gets around with a walker.  She will not be able to care for herself by herself.  She says that her family definitely cannot stay any longer and they are leaving in the morning.  She has no other support.  Will consult TOC for assistance.  Question home health versus placement.  Procedures  Final Clinical Impressions(s) / ED Diagnoses     ICD-10-CM   1. Hemodialysis-associated hypotension  I95.3     2. Syncope, unspecified syncope type  R55       ED Discharge Orders     None         Discharge Instructions      You were seen in the ED today after a syncopal episode while at dialysis.  Your labs are reassuring as you do not have signs of infection, stable hemoglobin, appropriate electrolytes, your renal function is appropriate given that you are on dialysis.  Ensure that you continue taking torsemide on your nondialysis days.  You should be taking 20 mg of midodrine 3 times daily.  Please ensure you are not missing these doses as these are imperative for keeping your blood pressure at an appropriate level, especially on dialysis  days    Elmer Sow. Pilar Plate, MD [facility] Emergency Medicine [facility] [email or web address]    Sabas Sous, MD 02/15/24 [phone]

## 2024-02-16 ENCOUNTER — Encounter (HOSPITAL_COMMUNITY): Payer: Self-pay | Admitting: Nephrology

## 2024-02-16 LAB — BASIC METABOLIC PANEL
Anion gap: 13 (ref 5–15)
BUN: 32 mg/dL — ABNORMAL HIGH (ref 8–23)
CO2: 24 mmol/L (ref 22–32)
Calcium: 9.1 mg/dL (ref 8.9–10.3)
Chloride: 96 mmol/L — ABNORMAL LOW (ref 98–111)
Creatinine, Ser: 6.04 mg/dL — ABNORMAL HIGH (ref 0.44–1.00)
GFR, Estimated: 7 mL/min — ABNORMAL LOW (ref >60)
Glucose, Bld: 103 mg/dL — ABNORMAL HIGH (ref 70–99)
Potassium: 3.8 mmol/L (ref 3.5–5.1)
Sodium: 133 mmol/L — ABNORMAL LOW (ref 135–145)

## 2024-02-16 LAB — CBC
HCT: 30.6 % — ABNORMAL LOW (ref 36.0–46.0)
Hemoglobin: 9.5 g/dL — ABNORMAL LOW (ref 12.0–15.0)
MCH: 30.3 pg (ref 26.0–34.0)
MCHC: 31 g/dL (ref 30.0–36.0)
MCV: 97.5 fL (ref 80.0–100.0)
Platelets: 106 10*3/uL — ABNORMAL LOW (ref 150–400)
RBC: 3.14 MIL/uL — ABNORMAL LOW (ref 3.87–5.11)
RDW: 16.7 % — ABNORMAL HIGH (ref 11.5–15.5)
WBC: 7.6 10*3/uL (ref 4.0–10.5)
nRBC: 0 % (ref 0.0–0.2)

## 2024-02-16 LAB — PROTIME-INR
INR: 2.2 — ABNORMAL HIGH (ref 0.8–1.2)
Prothrombin Time: 24.9 s — ABNORMAL HIGH (ref 11.4–15.2)

## 2024-02-16 MED ORDER — CHLORHEXIDINE GLUCONATE CLOTH 2 % EX PADS
6.0000 | MEDICATED_PAD | Freq: Every day | CUTANEOUS | Status: DC
  Administered 2024-02-19: 6 via TOPICAL

## 2024-02-16 MED ORDER — WARFARIN SODIUM 5 MG PO TABS
5.0000 mg | ORAL_TABLET | Freq: Once | ORAL | Status: AC
  Administered 2024-02-16: 5 mg via ORAL
  Filled 2024-02-16: qty 1

## 2024-02-16 NOTE — TOC CM/SW Note (Signed)
 SW met with patient at bedside to discuss bed offers from [facility], she stated that her nephew and his wife will be going to look at the SNF's she was accepted too, patient stated she talked to the nephew and he is possibly leaning towards [facility]. The decision is planned to be mad by the family tomorrow.   Lily Peer, MSW, LCSWA Transition of Care  Clinical Social Worker (ED 3-11 Mon-Fri)  [phone]

## 2024-02-16 NOTE — Progress Notes (Signed)
 CSW spoke with patient to present her with bed offers. Patient states she wants CSW to speak with her nephew Reggie and his wife Crystal to discuss offers.  CSW spoke with both Reggie and Crystal to present them with bed offers. Reggie and Crystal agreeable to do their research on facilities and will return call to CSW with decision once a facility has been chosen so that insurance authorization can be initiated.  Edwin Dada, MSW, LCSW Transitions of Care  Clinical Social Worker II [phone]

## 2024-02-16 NOTE — Progress Notes (Signed)
 PHARMACY - ANTICOAGULATION CONSULT NOTE  Pharmacy Consult for warfarin Indication: atrial fibrillation, MVR  Allergies  Allergen Reactions   Spironolactone     hyperkalemia   Vital Signs: BP: 97/60 (03/19 0915) Pulse Rate: 75 (03/19 0915)  Labs: Recent Labs    02/14/24 1806 02/14/24 1931 02/15/24 0135 02/16/24 0425  HGB  --  10.6*  --  9.5*  HCT  --  34.3*  --  30.6*  PLT  --  67*  --  106*  LABPROT  --   --  21.5* 24.9*  INR  --   --  1.8* 2.2*  CREATININE  --  3.55*  --   --   TROPONINIHS 20* 17  --   --     Estimated Creatinine Clearance: 13.1 mL/min (A) (by C-G formula based on SCr of 3.55 mg/dL (H)).   Medical History: Past Medical History:  Diagnosis Date   CHF (congestive heart failure) (HCC)    CKD (chronic kidney disease)    Heart murmur    Hypertension    Moderate mitral regurgitation    moderate to severe MR with moderate pulmonary HTN   Multinodular goiter    Nonischemic cardiomyopathy (HCC)    EF 30-35% by echo 2015   Pulmonary HTN (HCC)    moderate with PASP by echo 2015   PVC's (premature ventricular contractions)    Assessment: Vanessa [name] is a 73 y.o. year old female presented on 02/14/2024 with concern for hypotension while at dialysis. On warfarin prior to admission for afib and MVR. Prior to admission regimen, 5mg  daily (total weekly dose 35mg ). Last dose prior to admission 3/16. Pharmacy consulted for warfarin inpatient management.   INR 2.2 today, pt received additional 5mg  dose overnight 3/17-3/18 given subtherapeutic 1.8 INR.    Goal of Therapy:  INR 2-3 Monitor platelets by anticoagulation protocol: Yes   Plan:  Warfarin 5mg  x 1  F/u INR, CBC and s/sx of bleeding   Thank you for allowing pharmacy to participate in this patient's care.  Ruben Im, PharmD Clinical Pharmacist 02/16/2024 10:38 AM Please check AMION for all [facility] Pharmacy numbers

## 2024-02-16 NOTE — ED Notes (Signed)
 Spoke with MD Deretha Emory regarding pts HD for today.

## 2024-02-16 NOTE — ED Provider Notes (Addendum)
 Emergency Medicine Observation Re-evaluation Note  Vanessa [name] is a 73 y.o. female, seen on rounds today.  Pt initially presented to the ED for complaints of Hypotension Currently, the patient is awaiting nursing home placement.  Patient is dialysis patient normally dialyzed Monday Wednesdays and Fridays.  Was dialyzed on Monday but her pressures got low so that is why she came in.  Patient unable to take care of herself at home show being evaluated for nursing home.  Patient would normally be dialyzed today.  Patient's blood pressure here is in the 90s.  Patient does have a port in her right anterior chest.  Nephrology is not aware that she is here.  Will contact nephrology.  Also basic metabolic panel not done today.  I have ordered that.  Physical Exam  BP 97/60   Pulse 75   Temp 98.7 F (37.1 C) (Oral)   Resp 16   Ht 1.6 m (5\' 3" )   Wt 66.1 kg   SpO2 97%   BMI 25.81 kg/m  Physical Exam General: Nontoxic no acute distress alert and oriented Cardiac: Regular rate and rhythm Lungs: Clear to auscultation, also has a dialysis catheter right anterior chest   ED Course / MDM  EKG:EKG Interpretation Date/Time:  Monday February 14 2024 16:12:01 EDT Ventricular Rate:  83 PR Interval:  221 QRS Duration:  124 QT Interval:  427 QTC Calculation: 502 R Axis:   70  Text Interpretation: Sinus rhythm Prolonged PR interval Confirmed by Kommor, Madison ([id number]) on 02/14/2024 8:30:55 PM  I have reviewed the labs performed to date as well as medications administered while in observation.  Recent changes in the last 24 hours include need for evaluation by nephrology for dialysis.  Patient still awaiting nursing home placement..  Plan  Current plan is for nursing home placement.  Will contact nephrology to put her on the list for dialysis.  May not need dialysis today she was dialyzed on Monday.  But based metabolic panel not ordered today I have ordered that.  Patient's INR was 2.2 CBC white count  was 7.6 hemoglobin 9.5 platelets 106.    [name], MD 02/16/24 1029   Nephrology aware that patient is a dialysis patient.  Patient's electrolytes from this morning look reasonable.  Potassium was 3.8 sodium 133 CO2 24.  Nephrology will put her on the list.  And they will come see her.    [name], MD 02/16/24 1118

## 2024-02-16 NOTE — ED Notes (Signed)
 Bed bath performed on patient and linens changed.

## 2024-02-16 NOTE — Consult Note (Signed)
 Renal Service Consult Note [facility]  Vanessa [name] 02/16/2024 Maree Krabbe, MD Requesting Physician: Dr. Deretha Emory  Reason for Consult: ESRD patient boarding in the ED awaiting SNF placement  HPI: The patient is a 73 y.o. year-old w/ PMH as below who presented to ED after passing out in dialysis on Monday.  Patient was alert and emergency room with blood pressure in the 90s.  She received normal saline bolus.  Patient improved, but lived alone and was dependent on her rolling walker.  Physical therapy evaluation was placed and they recommended SNF placement.  So at this point the patient is boarding in the ER awaiting SNF placement and we are asked to see the patient for dialysis while she is here.   Pt seen in the ED room.  Patient lives alone as above, takes the scat bus to dialysis.  Using right chest catheter for dialysis, she does not have a fistula or graft in place.    PMH Congestive heart failure. ESRD on dialysis Nonischemic cardiomyopathy Pulmonary hypertension Hypertension   ROS - denies CP, no joint pain, no HA, no blurry vision, no rash, no diarrhea, no nausea/ vomiting   Past Medical History  Past Medical History:  Diagnosis Date   CHF (congestive heart failure) (HCC)    CKD (chronic kidney disease)    Heart murmur    Hypertension    Moderate mitral regurgitation    moderate to severe MR with moderate pulmonary HTN   Multinodular goiter    Nonischemic cardiomyopathy (HCC)    EF 30-35% by echo 2015   Pulmonary HTN (HCC)    moderate with PASP by echo 2015   PVC's (premature ventricular contractions)    Past Surgical History  Past Surgical History:  Procedure Laterality Date   CARDIAC CATHETERIZATION  2003   normal coronary arteries   CARDIAC CATHETERIZATION  2013  Nov   h/o nonischemic DCM EF 35-40% increase LVEDP    EXPLORATION POST OPERATIVE OPEN HEART N/A 11/29/2023   Procedure: EXPLORATION POST OPERATIVE OPEN HEART;   Surgeon: Eugenio Hoes, MD;  Location: [facility] OR;  Service: Open Heart Surgery;  Laterality: N/A;   HYSTEROSCOPY WITH D & C  12/02/2012   Procedure: DILATATION AND CURETTAGE /HYSTEROSCOPY;  Surgeon: Dorien Chihuahua. Richardson Dopp, MD;  Location: [facility] ORS;  Service: Gynecology;  Laterality: N/A;  cervical polypectomy   IR FLUORO GUIDE CV LINE LEFT  12/24/2023   IR FLUORO GUIDE CV LINE RIGHT  12/24/2023   IR REMOVAL TUN CV CATH W/O FL  01/20/2024   IR US GUIDE VASC ACCESS LEFT  12/24/2023   IR US GUIDE VASC ACCESS RIGHT  12/24/2023   LEFT AND RIGHT HEART CATHETERIZATION WITH CORONARY ANGIOGRAM N/A 02/15/2015   Procedure: LEFT AND RIGHT HEART CATHETERIZATION WITH CORONARY ANGIOGRAM;  Surgeon: Corky Crafts, MD;  Location: [facility] CATH LAB;  Service: Cardiovascular;  Laterality: N/A;   MITRAL VALVE REPLACEMENT N/A 11/29/2023   Procedure: MITRAL VALVE (MV) REPLACEMENT USING MOSAIC 310CINCH TISSUE VALVE;  Surgeon: Eugenio Hoes, MD;  Location: [facility] OR;  Service: Open Heart Surgery;  Laterality: N/A;   RIGHT HEART CATH N/A 12/17/2023   Procedure: RIGHT HEART CATH;  Surgeon: Laurey Morale, MD;  Location: [facility] INVASIVE CV LAB;  Service: Cardiovascular;  Laterality: N/A;   RIGHT/LEFT HEART CATH AND CORONARY ANGIOGRAPHY N/A 10/12/2023   Procedure: RIGHT/LEFT HEART CATH AND CORONARY ANGIOGRAPHY;  Surgeon: Laurey Morale, MD;  Location: [facility] INVASIVE CV LAB;  Service: Cardiovascular;  Laterality: N/A;   TEE WITHOUT CARDIOVERSION N/A 10/11/2018   Procedure: TRANSESOPHAGEAL ECHOCARDIOGRAM (TEE);  Surgeon: Laurey Morale, MD;  Location: [facility] ENDOSCOPY;  Service: Cardiovascular;  Laterality: N/A;   TEE WITHOUT CARDIOVERSION N/A 09/07/2023   Procedure: TRANSESOPHAGEAL ECHOCARDIOGRAM;  Surgeon: Laurey Morale, MD;  Location: [facility] INVASIVE CV LAB;  Service: Cardiovascular;  Laterality: N/A;   TEE WITHOUT CARDIOVERSION N/A 11/29/2023   Procedure: TRANSESOPHAGEAL ECHOCARDIOGRAM;  Surgeon: Eugenio Hoes, MD;  Location: [facility] OR;  Service: Open Heart  Surgery;  Laterality: N/A;   TRANSCATHETER MITRAL EDGE TO EDGE REPAIR N/A 12/14/2018   Procedure: MITRAL VALVE REPAIR;  Surgeon: Tonny Bollman, MD;  Location: [facility] INVASIVE CV LAB;  Service: Cardiovascular;  Laterality: N/A;   TRICUSPID VALVE REPLACEMENT N/A 11/29/2023   Procedure: TRICUSPID VALVE REPAIR USING MC3 EDWARDS TRICUSPID ANNULOPLASTY RING;  Surgeon: Eugenio Hoes, MD;  Location: [facility] OR;  Service: Open Heart Surgery;  Laterality: N/A;   Family History  Family History  Problem Relation Age of Onset   Breast cancer Mother 45   Heart disease Father    Breast cancer Sister 85   Colon cancer Neg Hx    Esophageal cancer Neg Hx    Rectal cancer Neg Hx    Stomach cancer Neg Hx    Thyroid disease Neg Hx    Social History  reports that she has never smoked. She has never used smokeless tobacco. She reports that she does not drink alcohol and does not use drugs. Allergies  Allergies  Allergen Reactions   Spironolactone     hyperkalemia   Home medications Prior to Admission medications   Medication Sig Start Date End Date Taking? Authorizing Provider  amiodarone (PACERONE) 200 MG tablet Take 1 tablet (200 mg total) by mouth daily. 01/28/24  Yes Love, Evlyn Kanner, PA-C  ascorbic acid (VITAMIN C) 500 MG tablet Take 0.5 tablets (250 mg total) by mouth 2 (two) times daily. 01/27/24  Yes Love, Evlyn Kanner, PA-C  atorvastatin (LIPITOR) 10 MG tablet Take 10 mg by mouth daily. 11/25/23  Yes [provider]  docusate sodium (COLACE) 100 MG capsule Take 2 capsules (200 mg total) by mouth 2 (two) times daily. Patient taking differently: Take 100 mg by mouth daily as needed for mild constipation. 01/27/24  Yes Love, Evlyn Kanner, PA-C  fludrocortisone (FLORINEF) 0.1 MG tablet Take 1 tablet (0.1 mg total) by mouth daily. 01/28/24  Yes Love, Evlyn Kanner, PA-C  midodrine (PROAMATINE) 10 MG tablet Take 2 tablets (20 mg total) by mouth 3 (three) times daily with meals. 01/28/24  Yes Love, Evlyn Kanner, PA-C   mirtazapine (REMERON) 7.5 MG tablet Take 1 tablet (7.5 mg total) by mouth at bedtime. 01/27/24  Yes Love, Evlyn Kanner, PA-C  multivitamin (RENA-VIT) TABS tablet Take 1 tablet by mouth 2 (two) times daily. 01/27/24  Yes Love, Evlyn Kanner, PA-C  polyethylene glycol powder (GLYCOLAX/MIRALAX) 17 GM/SCOOP powder Take 17 g by mouth daily as needed. 01/27/24  Yes Love, Evlyn Kanner, PA-C  sertraline (ZOLOFT) 25 MG tablet Take 1 tablet (25 mg total) by mouth daily. 01/28/24  Yes Love, Evlyn Kanner, PA-C  torsemide [facility]) 100 MG tablet Take 1 tablet (100 mg total) by mouth every Tuesday, Thursday, and Saturday at 6 PM. 02/10/24  Yes Zannie Cove, MD  warfarin (COUMADIN) 5 MG tablet Take 1 tablet (5 mg total) by mouth daily at 4 PM. 01/28/24  Yes Love, Evlyn Kanner, PA-C  lidocaine-prilocaine (EMLA) cream Apply 1 Application topically as needed (topical  anesthesia for hemodialysis if Gebauers and Lidocaine injection are ineffective.). Patient not taking: Reported on 02/15/2024 01/27/24   Love, Evlyn Kanner, PA-C  senna-docusate (SENOKOT-S) 8.6-50 MG tablet Take 1 tablet by mouth at bedtime. Patient not taking: Reported on 02/15/2024 01/27/24   Jacquelynn Cree, PA-C     Vitals:   02/16/24 0415 02/16/24 0700 02/16/24 0800 02/16/24 0915  BP:  103/65 102/64 97/60  Pulse:  70 70 75  Resp:      Temp:      TempSrc:      SpO2: 97% 96% 98% 97%  Weight:      Height:       Exam Gen alert, no distress No rash, cyanosis or gangrene Sclera anicteric, throat clear  No jvd or bruits Chest clear bilat to bases, no rales/ wheezing RRR no MRG Abd soft ntnd no mass or ascites +bs GU defer MS no joint effusions or deformity Ext 1+ bilat pretib edema, no other edema Neuro is alert, Ox 3 , nf    RIJ TDC intact      Renal-related home meds: Midodrine 20 mg 3 times daily Rena-Vite Demadex 100 mg TTS, may have been discontinued    OP HD: [facility] MWF  66kg  dry wt    Assessment/ Plan: SNF placement: boarding in ED. Came to ED  after passing out at HD 3/17.  ESRD: on HD MWF. Will put her on the list for HD today/ tonight or tomorrow.  Chronic hypotension: on midodrine 20 tid. Continue.  Volume: has mild LE edema, but w/ hx as above would not be too aggressive w/ UF.  Anemia of esrd: Hb 10-11 here, follow, tx for Hb < 7.  Secondary hyperparathyroidism: CCa and phos in range, cont binders ac Hx of pulm HTN Hx of NICM      Rob Ridhaan Dreibelbis  MD [facility] 02/16/2024, 2:17 PM  Recent Labs  Lab 02/11/24 0818 02/14/24 1931 02/16/24 0425  HGB 9.7* 10.6* 9.5*  ALBUMIN 2.9* 2.9*  --   CALCIUM 9.1 9.0 9.1  PHOS 3.6 3.3  --   CREATININE 5.49* 3.55* 6.04*  K 3.7 3.5 3.8   Inpatient medications:  amiodarone  200 mg Oral Daily   atorvastatin  10 mg Oral Daily   docusate sodium  200 mg Oral BID   fludrocortisone  0.1 mg Oral Daily   midodrine  20 mg Oral TID WC   mirtazapine  7.5 mg Oral QHS   multivitamin  1 tablet Oral BID   senna-docusate  1 tablet Oral QHS   sertraline  25 mg Oral Daily   torsemide  100 mg Oral Q T,Th,Sat-1800   warfarin  5 mg Oral ONCE-1600   Warfarin - Pharmacist Dosing Inpatient   Does not apply q1600    polyethylene glycol

## 2024-02-16 NOTE — ED Notes (Signed)
 Spoke with lab to add on BMP to labs sent this am.

## 2024-02-16 NOTE — Progress Notes (Signed)
 Pt receives out-pt HD at [facility]) on MWF 12:10 chair time. Info provided to CSW for d/c planning purposes. Will assist as needed.   Olivia Canter Renal Navigator [phone]

## 2024-02-17 LAB — PROTIME-INR
INR: 2.6 — ABNORMAL HIGH (ref 0.8–1.2)
Prothrombin Time: 28.1 s — ABNORMAL HIGH (ref 11.4–15.2)

## 2024-02-17 LAB — CBC
HCT: 34.3 % — ABNORMAL LOW (ref 36.0–46.0)
Hemoglobin: 11 g/dL — ABNORMAL LOW (ref 12.0–15.0)
MCH: 30.4 pg (ref 26.0–34.0)
MCHC: 32.1 g/dL (ref 30.0–36.0)
MCV: 94.8 fL (ref 80.0–100.0)
Platelets: 125 10*3/uL — ABNORMAL LOW (ref 150–400)
RBC: 3.62 MIL/uL — ABNORMAL LOW (ref 3.87–5.11)
RDW: 16.3 % — ABNORMAL HIGH (ref 11.5–15.5)
WBC: 6.6 10*3/uL (ref 4.0–10.5)
nRBC: 0 % (ref 0.0–0.2)

## 2024-02-17 MED ORDER — HEPARIN SODIUM (PORCINE) 1000 UNIT/ML IJ SOLN
3200.0000 [IU] | INTRAMUSCULAR | Status: DC | PRN
  Administered 2024-02-17 – 2024-02-18 (×2): 3200 [IU] via INTRAVENOUS
  Filled 2024-02-17: qty 3.2
  Filled 2024-02-17 (×2): qty 4

## 2024-02-17 MED ORDER — WARFARIN SODIUM 2.5 MG PO TABS
2.5000 mg | ORAL_TABLET | Freq: Once | ORAL | Status: AC
  Administered 2024-02-17: 2.5 mg via ORAL
  Filled 2024-02-17: qty 1

## 2024-02-17 MED ORDER — CHLORHEXIDINE GLUCONATE CLOTH 2 % EX PADS
6.0000 | MEDICATED_PAD | Freq: Every day | CUTANEOUS | Status: DC
  Administered 2024-02-19: 6 via TOPICAL

## 2024-02-17 NOTE — Progress Notes (Signed)
 PHARMACY - ANTICOAGULATION CONSULT NOTE  Pharmacy Consult for warfarin Indication: atrial fibrillation, MVR  Allergies  Allergen Reactions   Spironolactone     hyperkalemia   Vital Signs: Temp: 97.7 F (36.5 C) (03/20 0615) Temp Source: Oral (03/19 2100) BP: 96/59 (03/20 0630) Pulse Rate: 73 (03/20 0630)  Labs: Recent Labs    02/14/24 1806 02/14/24 1931 02/15/24 0135 02/16/24 0425 02/17/24 0657  HGB  --  10.6*  --  9.5*  --   HCT  --  34.3*  --  30.6*  --   PLT  --  67*  --  106*  --   LABPROT  --   --  21.5* 24.9* 28.1*  INR  --   --  1.8* 2.2* 2.6*  CREATININE  --  3.55*  --  6.04*  --   TROPONINIHS 20* 17  --   --   --     Estimated Creatinine Clearance: 7.7 mL/min (A) (by C-G formula based on SCr of 6.04 mg/dL (H)).   Medical History: Past Medical History:  Diagnosis Date   CHF (congestive heart failure) (HCC)    ESRD on hemodialysis (HCC)    Heart murmur    Hypertension    Moderate mitral regurgitation    moderate to severe MR with moderate pulmonary HTN   Multinodular goiter    Nonischemic cardiomyopathy (HCC)    EF 30-35% by echo 2015   Pulmonary HTN (HCC)    moderate with PASP by echo 2015   PVC's (premature ventricular contractions)    Assessment: Vanessa [name] is a 72 y.o. year old female presented on 02/14/2024 with concern for hypotension while at dialysis. On warfarin prior to admission for afib and MVR. Prior to admission regimen, 5mg  daily (total weekly dose 35mg ). Last dose prior to admission 3/16. Pharmacy consulted for warfarin inpatient management.   INR up 2.2> 2.6 today, pt received additional 5mg  dose overnight 3/17-3/18 given subtherapeutic 1.8 INR. Will give lower dose today as the INR has increased 0.4 for 2 days in a row.    Goal of Therapy:  INR 2-3 Monitor platelets by anticoagulation protocol: Yes   Plan:  Warfarin 2.5mg  x 1  F/u INR, CBC and s/sx of bleeding   Thank you for allowing pharmacy to participate in  this patient's care.  Ruben Im, PharmD Clinical Pharmacist 02/17/2024 8:11 AM Please check AMION for all [facility] Pharmacy numbers

## 2024-02-17 NOTE — Progress Notes (Signed)
 Completed 3 hours of dialysis. Tolerated net fluid removal of 2L. Patient had episodes of hypotension. SBP below 90's. Alert, asymptomatic.  RIJ TDC  accessed using aseptic technique.hep-locked and replaced new caps securely. Patient is alert and conversant, in stable condition upon leaving dialysis unit. Handoff given to [facility]

## 2024-02-17 NOTE — Progress Notes (Addendum)
 CSW spoke with patient's niece Crystal to discuss discharge plan. Crystal states a facility has not been chosen at this time but CSW emphasized importance of patient discharging to SNF as soon as possible due to her being in the ED. Crystal states she will speak with her husband Reggie to request he make a decision and return call to CSW.  CSW received call from patient's nephew Reggie who states he wants to accept bed offer from [facility].  CSW notified Bjorn Loser at [employer] of information.  CSW spoke with Fabian November at [facility] to initiate insurance authorization.  Edwin Dada, MSW, LCSW Transitions of Care  Clinical Social Worker II [phone]

## 2024-02-17 NOTE — Progress Notes (Signed)
 [facility] Progress Note  Subjective:    Vitals:   02/17/24 0500 02/17/24 0600 02/17/24 0615 02/17/24 0630  BP: (!) 95/53 (!) 93/59 (!) 89/67 (!) 96/59  Pulse: 72 70 76 73  Resp: 17 17 14 18   Temp:   97.7 F (36.5 C)   TempSrc:      SpO2: 99% 99% 99%   Weight:      Height:        Exam: Gen alert, no distress Sclera anicteric, throat clear  No jvd or bruits Chest clear bilat to bases RRR no MRG Abd soft ntnd no mass or ascites +bs Ext 1+ bilat pretib edema Neuro is alert, Ox 3 , nf    RIJ TDC intact     Renal-related home meds: Midodrine 20 mg 3 times daily Rena-Vite Demadex 100 mg TTS, may have been discontinued      OP HD: [facility] MWF  4h   B350   66kg   2K bath  TDC   Heparin 2000 - last OP HD 3/17, post wt 71kg  - not getting to dry wt - no meds - last Hb 11.8, last pth 213     Assessment/ Plan: SNF placement: boarding in ED. Came to ED after passing out at HD 3/17.  ESRD: on HD MWF. HD here last night. Next HD Friday.  Chronic hypotension: on midodrine 20 tid. Continue.  Volume: has mild LE edema. Not getting to her dry wt at OP unit. 2L off w/ HD yesterday. Plan 2- 2.5 L w/ next HD.  Anemia of esrd: Hb 10-11 here, follow, tx for Hb < 7.  Secondary hyperparathyroidism: CCa and phos in range, cont binders ac Hx of pulm HTN Hx of NICM     Rob Aleia Larocca MD  [facility] 02/17/2024, 12:17 PM  Recent Labs  Lab 02/11/24 0818 02/14/24 1931 02/16/24 0425 02/17/24 0657  HGB 9.7* 10.6* 9.5* 11.0*  ALBUMIN 2.9* 2.9*  --   --   CALCIUM 9.1 9.0 9.1  --   PHOS 3.6 3.3  --   --   CREATININE 5.49* 3.55* 6.04*  --   K 3.7 3.5 3.8  --    No results for input(s): "IRON", "TIBC", "FERRITIN" in the last 168 hours. Inpatient medications:  amiodarone  200 mg Oral Daily   atorvastatin  10 mg Oral Daily   Chlorhexidine Gluconate Cloth  6 each Topical Q0600   docusate sodium  200 mg Oral BID   fludrocortisone  0.1 mg Oral Daily   midodrine  20 mg Oral TID WC    mirtazapine  7.5 mg Oral QHS   multivitamin  1 tablet Oral BID   senna-docusate  1 tablet Oral QHS   sertraline  25 mg Oral Daily   torsemide  100 mg Oral Q T,Th,Sat-1800   warfarin  2.5 mg Oral ONCE-1600   Warfarin - Pharmacist Dosing Inpatient   Does not apply q1600    heparin sodium (porcine), polyethylene glycol

## 2024-02-17 NOTE — ED Provider Notes (Signed)
 Emergency Medicine Observation Re-evaluation Note  Vanessa [name] is a 73 y.o. female, seen on rounds today.  Pt initially presented to the ED for complaints of Hypotension Currently, the patient is resting comfortably sleeping.  Physical Exam  BP (!) 96/59   Pulse 73   Temp 97.7 F (36.5 C)   Resp 18   Ht 5\' 3"  (1.6 m)   Wt 66.1 kg   SpO2 99%   BMI 25.81 kg/m  Physical Exam General: No acute agitation Cardiac: Not tachycardic on last vitals Lungs: Symmetric rise and fall of chest wall sleeping Psych: No agitation  ED Course / MDM  EKG:EKG Interpretation Date/Time:  Monday February 14 2024 16:12:01 EDT Ventricular Rate:  83 PR Interval:  221 QRS Duration:  124 QT Interval:  427 QTC Calculation: 502 R Axis:   70  Text Interpretation: Sinus rhythm Prolonged PR interval Confirmed by Kommor, Madison ([id number]) on 02/14/2024 8:30:55 PM  I have reviewed the labs performed to date as well as medications administered while in observation.  Recent changes in the last 24 hours include none reported by overnight nursing.  Plan  Current plan is for awaiting placement.    Jovi Zavadil, Canary Brim, MD 02/17/24 [phone]

## 2024-02-18 LAB — PROTIME-INR
INR: 2.9 — ABNORMAL HIGH (ref 0.8–1.2)
Prothrombin Time: 30.9 s — ABNORMAL HIGH (ref 11.4–15.2)

## 2024-02-18 LAB — CBC
HCT: 33.8 % — ABNORMAL LOW (ref 36.0–46.0)
Hemoglobin: 10.9 g/dL — ABNORMAL LOW (ref 12.0–15.0)
MCH: 30.9 pg (ref 26.0–34.0)
MCHC: 32.2 g/dL (ref 30.0–36.0)
MCV: 95.8 fL (ref 80.0–100.0)
Platelets: 159 10*3/uL (ref 150–400)
RBC: 3.53 MIL/uL — ABNORMAL LOW (ref 3.87–5.11)
RDW: 16.6 % — ABNORMAL HIGH (ref 11.5–15.5)
WBC: 7.8 10*3/uL (ref 4.0–10.5)
nRBC: 0 % (ref 0.0–0.2)

## 2024-02-18 MED ORDER — HEPARIN SODIUM (PORCINE) 1000 UNIT/ML IJ SOLN
INTRAMUSCULAR | Status: AC
  Filled 2024-02-18: qty 3

## 2024-02-18 MED ORDER — LIDOCAINE-PRILOCAINE 2.5-2.5 % EX CREA: 1.0000 | TOPICAL_CREAM | CUTANEOUS | Status: DC | PRN

## 2024-02-18 MED ORDER — HEPARIN SODIUM (PORCINE) 1000 UNIT/ML DIALYSIS
2000.0000 [IU] | Freq: Once | INTRAMUSCULAR | Status: AC
  Administered 2024-02-18: 2000 [IU] via INTRAVENOUS_CENTRAL

## 2024-02-18 MED ORDER — HEPARIN SODIUM (PORCINE) 1000 UNIT/ML DIALYSIS
1000.0000 [IU] | INTRAMUSCULAR | Status: AC | PRN
  Administered 2024-02-18: 1000 [IU] via INTRAVENOUS_CENTRAL

## 2024-02-18 MED ORDER — ALTEPLASE 2 MG IJ SOLR: 2.0000 mg | Freq: Once | INTRAMUSCULAR | Status: DC | PRN

## 2024-02-18 MED ORDER — NEPRO/CARBSTEADY PO LIQD: 237.0000 mL | ORAL | Status: DC | PRN

## 2024-02-18 MED ORDER — WARFARIN SODIUM 2.5 MG PO TABS
2.5000 mg | ORAL_TABLET | Freq: Once | ORAL | Status: AC
  Administered 2024-02-18: 2.5 mg via ORAL
  Filled 2024-02-18: qty 1

## 2024-02-18 MED ORDER — HEPARIN SODIUM (PORCINE) 1000 UNIT/ML DIALYSIS: 1000.0000 [IU] | INTRAMUSCULAR | Status: DC | PRN

## 2024-02-18 MED ORDER — ANTICOAGULANT SODIUM CITRATE 4% (200MG/5ML) IV SOLN: 5.0000 mL | Status: DC | PRN

## 2024-02-18 MED ORDER — PENTAFLUOROPROP-TETRAFLUOROETH EX AERO: 1.0000 | INHALATION_SPRAY | CUTANEOUS | Status: DC | PRN

## 2024-02-18 MED ORDER — LIDOCAINE HCL (PF) 1 % IJ SOLN: 5.0000 mL | INTRAMUSCULAR | Status: DC | PRN

## 2024-02-18 NOTE — Progress Notes (Signed)
 PHARMACY - ANTICOAGULATION CONSULT NOTE  Pharmacy Consult for warfarin Indication: atrial fibrillation, MVR  Allergies  Allergen Reactions   Spironolactone     hyperkalemia   Vital Signs: Temp: 97.7 F (36.5 C) (03/21 0600) Temp Source: Oral (03/21 0600) BP: 99/62 (03/21 0600) Pulse Rate: 73 (03/21 0600)  Labs: Recent Labs    02/16/24 0425 02/17/24 0657 02/18/24 0432  HGB 9.5* 11.0* 10.9*  HCT 30.6* 34.3* 33.8*  PLT 106* 125* 159  LABPROT 24.9* 28.1* 30.9*  INR 2.2* 2.6* 2.9*  CREATININE 6.04*  --   --     Estimated Creatinine Clearance: 7.7 mL/min (A) (by C-G formula based on SCr of 6.04 mg/dL (H)).   Medical History: Past Medical History:  Diagnosis Date   CHF (congestive heart failure) (HCC)    ESRD on hemodialysis (HCC)    Heart murmur    Hypertension    Moderate mitral regurgitation    moderate to severe MR with moderate pulmonary HTN   Multinodular goiter    Nonischemic cardiomyopathy (HCC)    EF 30-35% by echo 2015   Pulmonary HTN (HCC)    moderate with PASP by echo 2015   PVC's (premature ventricular contractions)    Assessment: Vanessa [name] is a 73 y.o. year old female presented on 02/14/2024 with concern for hypotension while at dialysis. On warfarin prior to admission for afib and MVR. Prior to admission regimen, 5mg  daily (total weekly dose 35mg ). Last dose prior to admission 3/16. Pharmacy consulted for warfarin inpatient management.   INR up 2.2> 2.6>2.9 today, pt received additional 5mg  dose overnight 3/17-3/18 given subtherapeutic 1.8 INR. Will give lower dose today as the INR continues to increase.    Goal of Therapy:  INR 2-3 Monitor platelets by anticoagulation protocol: Yes   Plan:  Warfarin 2.5mg  x 1  F/u INR, CBC and s/sx of bleeding   Thank you for allowing pharmacy to participate in this patient's care.  Ruben Im, PharmD Clinical Pharmacist 02/18/2024 7:51 AM Please check AMION for all [facility] Pharmacy  numbers

## 2024-02-18 NOTE — Progress Notes (Signed)
 [facility] Progress Note  Subjective:  Feeling good today BP's soft again  Vitals:   02/18/24 1000 02/18/24 1030 02/18/24 1100 02/18/24 1130  BP: 105/66 103/68 98/67 97/67   Pulse: 74 75 77 78  Resp: 20 20 18 19   Temp:      TempSrc:      SpO2: 98% 97% 98% 98%  Weight:      Height:        Exam: Gen alert, no distress Sclera anicteric, throat clear  No jvd or bruits Chest clear bilat to bases RRR no MRG Abd soft ntnd no mass or ascites +bs Ext 1+ bilat pretib edema Neuro is alert, Ox 3 , nf    RIJ TDC intact     Renal-related home meds: Midodrine 20 mg 3 times daily Rena-Vite Demadex 100 mg TTS, may have been discontinued      OP HD: [facility] MWF  4h   B350   66kg   2K bath  TDC   Heparin 2000 - last OP HD 3/17, post wt 71kg  - not getting to dry wt - no meds - last Hb 11.8, last pth 213    CXR 3/17 - marked cardiomegaly, no edema   Echo - EF 35-40%, RV mod reduced. Pulm HTN moderate. H/o MV repair/ replace.    Assessment/ Plan: SNF placement: boarding in ED. Came to ED after passing out at HD 3/17. She states she also passed out on another occasion some while ago while on dialysis.   ESRD: on HD MWF. HD here last night. HD today.  Chronic hypotension: on midodrine 20 tid which is a high dose.  Volume: has no sig edema or vol issues at hand.  I think she may be gaining body wt. She is 2.4 kg up today- will not pull any fluid and raise her dry wt, see if she does well.  Anemia of esrd: Hb 10-11 here, follow, tx for Hb < 7.  Secondary hyperparathyroidism: CCa and phos in range, cont binders Hx of pulm HTN Hx of CM - EF 35-40% Hx of MV replacement/ TV repair - recently done . Last seen by cardiology 3/13, see their note.      Vanessa Moselle MD  [facility] 02/18/2024, 11:44 AM  Recent Labs  Lab 02/14/24 1931 02/16/24 0425 02/17/24 0657 02/18/24 0432  HGB 10.6* 9.5* 11.0* 10.9*  ALBUMIN 2.9*  --   --   --   CALCIUM 9.0 9.1  --   --   PHOS 3.3  --   --    --   CREATININE 3.55* 6.04*  --   --   K 3.5 3.8  --   --    No results for input(s): "IRON", "TIBC", "FERRITIN" in the last 168 hours. Inpatient medications:  amiodarone  200 mg Oral Daily   atorvastatin  10 mg Oral Daily   Chlorhexidine Gluconate Cloth  6 each Topical Q0600   Chlorhexidine Gluconate Cloth  6 each Topical Q0600   docusate sodium  200 mg Oral BID   fludrocortisone  0.1 mg Oral Daily   heparin sodium (porcine)       midodrine  20 mg Oral TID WC   mirtazapine  7.5 mg Oral QHS   multivitamin  1 tablet Oral BID   senna-docusate  1 tablet Oral QHS   sertraline  25 mg Oral Daily   torsemide  100 mg Oral Q T,Th,Sat-1800   warfarin  2.5 mg Oral ONCE-1600   Warfarin -  Pharmacist Dosing Inpatient   Does not apply q1600    anticoagulant sodium citrate     alteplase, anticoagulant sodium citrate, feeding supplement (NEPRO CARB STEADY), heparin, heparin sodium (porcine), heparin sodium (porcine), lidocaine (PF), lidocaine-prilocaine, pentafluoroprop-tetrafluoroeth, polyethylene glycol

## 2024-02-18 NOTE — Progress Notes (Signed)
   02/18/24 1155  Vitals  Temp 98.5 F (36.9 C)  Pulse Rate 77  Resp 16  BP (!) 96/59 (Simultaneous filing. User may not have seen previous data.)  SpO2 97 %  O2 Device Room Air  Oxygen Therapy  Patient Activity (if Appropriate) In bed  Pulse Oximetry Type Continuous  Oximetry Probe Site Changed No  During Treatment Monitoring  Blood Flow Rate (mL/min) 349 mL/min  Arterial Pressure (mmHg) -124.03 mmHg  Venous Pressure (mmHg) 131.71 mmHg  TMP (mmHg) -4.64 mmHg  Ultrafiltration Rate (mL/min) 141 mL/min  Dialysate Flow Rate (mL/min) 300 ml/min  Dialysate Potassium Concentration 3 (Simultaneous filing. User may not have seen previous data.)  Dialysate Calcium Concentration 2.5 (Simultaneous filing. User may not have seen previous data.)  Duration of HD Treatment -hour(s) 3.17 hour(s)  Cumulative Fluid Removed (mL) per Treatment  0.02  HD Safety Checks Performed Yes  Intra-Hemodialysis Comments Tx completed;Tolerated well   Received patient in bed to unit.  Alert and oriented.  Informed consent signed and in chart.   TX duration: 3.25 hours  Patient tolerated well.  Transported back to the room  Alert, without acute distress.  Hand-off given to patient's nurse.   Access used: RIJ catheter Access issues: None  Total UF removed: 0 Medication(s) given: None   Mar Daring, LPN  [facility]

## 2024-02-18 NOTE — Progress Notes (Addendum)
 Physical Therapy Treatment Patient Details Name: Vanessa [name] MRN: [id number] DOB: Dec 18, 1950 Today's Date: 02/18/2024   History of Present Illness 73 y.o. female who presents 02/14/24 from her dialysis center after syncopal episode. Just discharged 02/11/24.  PMH: ESRD, atrial fibrillation on warfarin, chronic HFrEF, and mitral valve replacement and tricuspid ring on 11/29/2023 complicated by tamponade and shock    PT Comments  Pt admitted with above diagnosis. Pt was able to ambulate and incr distance today with use of RW and min assist. At times needs mod assist and mod cues for safety with difficulty with transitions, sit to stand, stand to sit and bed mobility. Will benefit from post acute rehab < 3 hours day.   Pt currently with functional limitations due to the deficits listed below (see PT Problem List). Pt will benefit from acute skilled PT to increase their independence and safety with mobility to allow discharge.       If plan is discharge home, recommend the following: A lot of help with walking and/or transfers;Help with stairs or ramp for entrance;A little help with bathing/dressing/bathroom;Assistance with cooking/housework;Assist for transportation   Can travel by private vehicle     Yes  Equipment Recommendations  None recommended by PT    Recommendations for Other Services OT consult     Precautions / Restrictions Precautions Precautions: Fall Precaution/Restrictions Comments: monitor BP Restrictions Weight Bearing Restrictions Per Provider Order: No     Mobility  Bed Mobility Overal bed mobility: Needs Assistance Bed Mobility: Supine to Sit, Sit to Supine     Supine to sit: Min assist Sit to supine: Mod assist   General bed mobility comments: incr time and effort, needed R rail, min A for elevation of trunk to sitting from elevated HOB.  Mod assist for LEs back into bed.    Transfers Overall transfer level: Needs assistance Equipment used: Rolling walker  (2 wheels) Transfers: Sit to/from Stand, Bed to chair/wheelchair/BSC Sit to Stand: Min assist, From elevated surface           General transfer comment: pt wanted to try to stand by herself so was allowed to attempt but pt was unable to achieve standing. Needed min A for power up with bed elevated and pt does best pushing from bed with one hand with the other on the walker.    Ambulation/Gait Ambulation/Gait assistance: Min assist Gait Distance (Feet): 50 Feet Assistive device: Rolling walker (2 wheels) Gait Pattern/deviations: Step-to pattern, Trunk flexed, Wide base of support, Drifts right/left Gait velocity: decreased Gait velocity interpretation: <1.31 ft/sec, indicative of household ambulator   General Gait Details: trunk flexed, smal steps with overall good walker safety with in hallway.  Needed cues for safety when approaching bed to get close enough and assist to control descent to bed.   Stairs             Wheelchair Mobility     Tilt Bed    Modified Rankin (Stroke Patients Only)       Balance Overall balance assessment: Needs assistance Sitting-balance support: No upper extremity supported, Feet unsupported Sitting balance-Leahy Scale: Fair     Standing balance support: Bilateral upper extremity supported, Reliant on assistive device for balance Standing balance-Leahy Scale: Poor Standing balance comment: reliant on RW support during dynamic activities                            Communication Communication Communication: No apparent difficulties  Cognition Arousal:  Alert Behavior During Therapy: WFL for tasks assessed/performed   PT - Cognitive impairments: No apparent impairments                         Following commands: Intact      Cueing Cueing Techniques: Verbal cues  Exercises General Exercises - Lower Extremity Ankle Circles/Pumps: AROM, Both, 10 reps, Supine Quad Sets: AROM, Both, 10 reps, Supine Gluteal  Sets: AROM, Both, 10 reps, Supine Long Arc Quad: AROM, Both, 10 reps, Seated Straight Leg Raises: AROM, Both, 10 reps, Supine Hip Flexion/Marching: AROM, Both, 10 reps, Seated    General Comments General comments (skin integrity, edema, etc.): VSS      Pertinent Vitals/Pain Pain Assessment Pain Assessment: No/denies pain    Home Living                          Prior Function            PT Goals (current goals can now be found in the care plan section) Acute Rehab PT Goals Patient Stated Goal: agreeable to rehab Progress towards PT goals: Progressing toward goals    Frequency    Min 1X/week      PT Plan      Co-evaluation              AM-PAC PT "6 Clicks" Mobility   Outcome Measure  Help needed turning from your back to your side while in a flat bed without using bedrails?: None Help needed moving from lying on your back to sitting on the side of a flat bed without using bedrails?: A Little Help needed moving to and from a bed to a chair (including a wheelchair)?: A Lot Help needed standing up from a chair using your arms (e.g., wheelchair or bedside chair)?: A Lot Help needed to walk in hospital room?: A Little Help needed climbing 3-5 steps with a railing? : Total 6 Click Score: 15    End of Session Equipment Utilized During Treatment: Gait belt Activity Tolerance: Patient tolerated treatment well Patient left: with call bell/phone within reach;in bed;with bed alarm set Nurse Communication: Mobility status PT Visit Diagnosis: Difficulty in walking, not elsewhere classified (R26.2);Muscle weakness (generalized) (M62.81)     Time: 0865-7846 PT Time Calculation (min) (ACUTE ONLY): 15 min  Charges:    $Gait Training: 8-22 mins PT General Charges $$ ACUTE PT VISIT: 1 Visit                     Vanessa [name],PT Acute Rehab Services [phone]    Vanessa [name] 02/18/2024, 3:21 PM

## 2024-02-18 NOTE — Progress Notes (Addendum)
 2:20pm: CSW received call from [place] at [facility] requesting a new PT note as patient's authorization request has been sent to MD for review.  CSW spoke with therapy office staff to request patient be seen today to support insurance authorization request.  1pm: Patient's insurance authorization is still pending at this time.  Edwin Dada, MSW, LCSW Transitions of Care  Clinical Social Worker II [phone]

## 2024-02-19 LAB — CBC
HCT: 36.9 % (ref 36.0–46.0)
Hemoglobin: 11.6 g/dL — ABNORMAL LOW (ref 12.0–15.0)
MCH: 30.8 pg (ref 26.0–34.0)
MCHC: 31.4 g/dL (ref 30.0–36.0)
MCV: 97.9 fL (ref 80.0–100.0)
Platelets: 186 10*3/uL (ref 150–400)
RBC: 3.77 MIL/uL — ABNORMAL LOW (ref 3.87–5.11)
RDW: 16.2 % — ABNORMAL HIGH (ref 11.5–15.5)
WBC: 8.2 10*3/uL (ref 4.0–10.5)
nRBC: 0 % (ref 0.0–0.2)

## 2024-02-19 LAB — PROTIME-INR
INR: 2.6 — ABNORMAL HIGH (ref 0.8–1.2)
Prothrombin Time: 28 s — ABNORMAL HIGH (ref 11.4–15.2)

## 2024-02-19 MED ORDER — WARFARIN SODIUM 4 MG PO TABS
4.0000 mg | ORAL_TABLET | Freq: Once | ORAL | Status: DC
  Filled 2024-02-19: qty 1

## 2024-02-19 NOTE — ED Notes (Signed)
 Report called to [facility] at [place], [place] called to request transport.

## 2024-02-19 NOTE — Discharge Planning (Signed)
 [facility] Patient Discharge Orders- [facility] CLINIC: [facility]  Patient's name: Vanessa [name] Admit/DC Dates: 02/14/2024 - 02/19/2024  Not formally admitted. Boarding in ED for SNF placement Presented with hypotension/syncope on HD   Recent Labs  Lab 02/14/24 1931 02/16/24 0425 02/17/24 0657 02/19/24 0515  HGB 10.6* 9.5*   < > 11.6*  K 3.5 3.8  --   --   CALCIUM 9.0 9.1  --   --   PHOS 3.3  --   --   --   ALBUMIN 2.9*  --   --   --    < > = values in this interval not displayed.   Aranesp: --    PRBC's --  ESA dose for discharge: No change   Outpatient Dialysis Orders:  -Heparin: No change  -Bath: No change  -EDW  68.5 kg   Max UF 2L    Access intervention/Change: ---   Medication Orders: -Anticoagulation -- on warfarin. Last INR 2.6    OTHER/APPTS   Completed by: Tomasa Blase PA-C  D/C Meds to be reconciled by nurse after every discharge.   Reviewed by: MD:______ RN_______

## 2024-02-19 NOTE — ED Notes (Signed)
 Pt given bath, linens and gown changed at this time.

## 2024-02-19 NOTE — ED Provider Notes (Signed)
 Emergency Medicine Observation Re-evaluation Note  Vanessa [name] is a 73 y.o. female, seen on rounds today.  Pt initially presented to the ED for complaints of Hypotension Currently, the patient is awaiting SNF placement.  Physical Exam  BP 110/81   Pulse 80   Temp 99.7 F (37.6 C) (Oral)   Resp 14   Ht 5\' 3"  (1.6 m)   Wt 68.2 kg   SpO2 98%   BMI 26.63 kg/m  Physical Exam General: Calm Cardiac: Well perfused Lungs: Even respirations Psych: Calm  ED Course / MDM  EKG:EKG Interpretation Date/Time:  Monday February 14 2024 16:12:01 EDT Ventricular Rate:  83 PR Interval:  221 QRS Duration:  124 QT Interval:  427 QTC Calculation: 502 R Axis:   70  Text Interpretation: Sinus rhythm Prolonged PR interval Confirmed by Kommor, [place] ([id number]) on 02/14/2024 8:30:55 PM  I have reviewed the labs performed to date as well as medications administered while in observation.  Recent changes in the last 24 hours include HD today. Patient accepted for 7 days to [facility]. Can go via [facility] when ready  Plan  Current plan is for d/c to [facility].    Maia Plan, MD 02/19/24 1210

## 2024-02-19 NOTE — Progress Notes (Addendum)
 12pm: CSW received call from [place] at [facility] who states patient's SNF request has been approved [id number] and is approved for 7 days.  CSW notified Bjorn Loser at [employer] of information.  CSW informed RN and MD of information.  Patient will go to [facility], room #3234 via [facility] #[id number]) - RN to call when ready. The number for report is [phone], ext [phone].  CSW spoke with patient's niece Crystal to inform her of discharge plan.  11:30am: CSW returned call to [place] at [facility] without success - a voicemail was left requesting a return call.  10:25am: CSW spoke with Dr. Logan Bores to discuss patient. Dr. Logan Bores states [facility] will authorize a few weeks of STR at [facility] but believes patient will require long term care. Per Dr. Logan Bores, patient has previously refused to go into a facility for LTC. Dr. Logan Bores states he spoke with patient's nephew Reggie yesterday to discuss this information.  CSW will wait for authorization details in order to facilitate discharge to SNF.  9:45am: CSW received call from Alicia at [facility] states Dr. Logan Bores is requesting call from CSW to answer questions regarding patient's discharge plan. Junious Dresser states the insurance authorization request for transportation was approved [id number].  CSW attempted to reach Dr. Logan Bores at [phone] without success - a voicemail was left requesting a return call.  Edwin Dada, MSW, LCSW Transitions of Care  Clinical Social Worker II [phone]

## 2024-02-19 NOTE — Progress Notes (Signed)
 [facility] Progress Note  Subjective:  Feeling good today BP's soft again  Vitals:   02/18/24 1200 02/18/24 1202 02/18/24 1203 02/18/24 1958  BP: 91/62 (!) 89/58 (!) 95/56 110/81  Pulse: 77 78 77 80  Resp: 19 17 19 14   Temp:    99.7 F (37.6 C)  TempSrc:    Oral  SpO2: 97% 97% 96% 98%  Weight:      Height:        Exam: Gen alert, no distress Sclera anicteric, throat clear  No jvd or bruits Chest clear bilat to bases RRR no MRG Abd soft ntnd no mass or ascites +bs Ext no edema LE's Neuro is alert, Ox 3 , nf    RIJ TDC intact     Renal-related home meds: Midodrine 20 mg 3 times daily Rena-Vite Demadex 100 mg TTS, may have been discontinued      OP HD: [facility] MWF  4h   B350   66kg   2K bath  TDC   Heparin 2000 - last OP HD 3/17, post wt 71kg  - not getting to dry wt - no meds - last Hb 11.8, last pth 213    CXR 3/17 - marked cardiomegaly, no edema   Echo - EF 35-40%, RV mod reduced. Pulm HTN moderate. H/o MV repair/ replace.    Assessment/ Plan: SNF placement: boarding in ED. Came to ED after passing out at HD 3/17. She states she also passed out on another occasion recently while on dialysis.   ESRD: on HD MWF. Next HD monday.  Chronic hypotension: on midodrine 20 tid which is a high dose.  Volume: has no sig edema or vol issues at hand.  I think she may be gaining body wt. She is 2.4 kg up today- will not pull any fluid and raise her dry wt, see if she does well.  Anemia of esrd: Hb 10-11 here, follow, tx for Hb < 7.  Secondary hyperparathyroidism: CCa and phos in range, cont binders Hx of pulm HTN Hx of CM - EF 35-40% Hx of MV replacement/ TV repair - recently done . Last seen by cardiology 3/13, see their note.      Vinson Moselle MD  [facility] 02/19/2024, 11:48 AM  Recent Labs  Lab 02/14/24 1931 02/16/24 0425 02/17/24 0657 02/18/24 0432 02/19/24 0515  HGB 10.6* 9.5*   < > 10.9* 11.6*  ALBUMIN 2.9*  --   --   --   --   CALCIUM 9.0 9.1  --    --   --   PHOS 3.3  --   --   --   --   CREATININE 3.55* 6.04*  --   --   --   K 3.5 3.8  --   --   --    < > = values in this interval not displayed.   No results for input(s): "IRON", "TIBC", "FERRITIN" in the last 168 hours. Inpatient medications:  amiodarone  200 mg Oral Daily   atorvastatin  10 mg Oral Daily   Chlorhexidine Gluconate Cloth  6 each Topical Q0600   Chlorhexidine Gluconate Cloth  6 each Topical Q0600   docusate sodium  200 mg Oral BID   fludrocortisone  0.1 mg Oral Daily   midodrine  20 mg Oral TID WC   mirtazapine  7.5 mg Oral QHS   multivitamin  1 tablet Oral BID   senna-docusate  1 tablet Oral QHS   sertraline  25 mg  Oral Daily   torsemide  100 mg Oral Q T,Th,Sat-1800   Warfarin - Pharmacist Dosing Inpatient   Does not apply q1600     heparin sodium (porcine), polyethylene glycol

## 2024-02-19 NOTE — Progress Notes (Signed)
 PHARMACY - ANTICOAGULATION CONSULT NOTE  Pharmacy Consult for warfarin Indication: atrial fibrillation, MVR  Allergies  Allergen Reactions   Spironolactone     hyperkalemia   Vital Signs:    Labs: Recent Labs    02/17/24 0657 02/18/24 0432 02/19/24 0515 02/19/24 1147  HGB 11.0* 10.9* 11.6*  --   HCT 34.3* 33.8* 36.9  --   PLT 125* 159 186  --   LABPROT 28.1* 30.9*  --  28.0*  INR 2.6* 2.9*  --  2.6*    Estimated Creatinine Clearance: 7.8 mL/min (A) (by C-G formula based on SCr of 6.04 mg/dL (H)).   Medical History: Past Medical History:  Diagnosis Date   CHF (congestive heart failure) (HCC)    ESRD on hemodialysis (HCC)    Heart murmur    Hypertension    Moderate mitral regurgitation    moderate to severe MR with moderate pulmonary HTN   Multinodular goiter    Nonischemic cardiomyopathy (HCC)    EF 30-35% by echo 2015   Pulmonary HTN (HCC)    moderate with PASP by echo 2015   PVC's (premature ventricular contractions)    Assessment: Vanessa [name] is a 73 y.o. year old female presented on 02/14/2024 with concern for hypotension while at dialysis. On warfarin prior to admission for afib and MVR. Prior to admission regimen, 5mg  daily (total weekly dose 35mg ). Last dose prior to admission 3/16. Pharmacy consulted for warfarin inpatient management.   INR today therapeutic at 2.6. Patient has received reduced dose warfarin of 2.5mg  for past two days, prior to that received home dose of 5mg  x1.    Goal of Therapy:  INR 2-3 Monitor platelets by anticoagulation protocol: Yes   Plan:  Slightly reduced home warfarin dose of 4mg  x1 - anticipate INR will continue to downtrend given reduced dose x2 days 3/20 and 3/21.  F/u INR, CBC and s/sx of bleeding  Thank you for allowing pharmacy to participate in this patient's care.  Estill Batten, PharmD, BCCCP  Clinical Pharmacist 02/19/2024 12:49 PM Please check AMION for all [facility] Pharmacy numbers

## 2024-02-21 ENCOUNTER — Telehealth (HOSPITAL_COMMUNITY): Payer: Self-pay

## 2024-02-21 NOTE — Progress Notes (Signed)
 Late Note Entry- February 21, 2024  Pt d/c to snf on Saturday. Contacted [facility] this morning to be advised that pt was d/c to SNF on Saturday and should resume care today. Clinic provided snf name. Renal PA completed orders in Epic.   Olivia Canter Renal Navigator [phone]

## 2024-02-21 NOTE — Telephone Encounter (Signed)
 Called to confirm/remind patient of their appointment at the [facility] on 02/22/24.   Appointment:   [] Confirmed  [x] Left mess   [] No answer/No voice mail  [] Phone not in service  Patient reminded to bring all medications and/or complete list.

## 2024-02-22 ENCOUNTER — Inpatient Hospital Stay (HOSPITAL_COMMUNITY): Admit: 2024-02-22 | Payer: PPO

## 2024-04-20 ENCOUNTER — Ambulatory Visit (HOSPITAL_COMMUNITY): Payer: Self-pay | Admitting: Cardiology

## 2024-04-20 ENCOUNTER — Encounter (HOSPITAL_COMMUNITY): Payer: Self-pay | Admitting: Cardiology

## 2024-04-20 ENCOUNTER — Ambulatory Visit (HOSPITAL_COMMUNITY)
Admission: RE | Admit: 2024-04-20 | Discharge: 2024-04-20 | Disposition: A | Discharge status: Home / Self Care | Source: Ambulatory Visit | Attending: Cardiology | Admitting: Cardiology

## 2024-04-20 ENCOUNTER — Telehealth: Payer: Self-pay | Admitting: Pharmacist

## 2024-04-20 VITALS — BP 104/60 | HR 77 | Wt 161.4 lb

## 2024-04-20 DIAGNOSIS — Z952 Presence of prosthetic heart valve: Secondary | ICD-10-CM | POA: Insufficient documentation

## 2024-04-20 DIAGNOSIS — E0520 Thyrotoxicosis with toxic multinodular goiter without thyrotoxic crisis or storm: Secondary | ICD-10-CM | POA: Insufficient documentation

## 2024-04-20 DIAGNOSIS — Z7901 Long term (current) use of anticoagulants: Secondary | ICD-10-CM | POA: Insufficient documentation

## 2024-04-20 DIAGNOSIS — I428 Other cardiomyopathies: Secondary | ICD-10-CM | POA: Insufficient documentation

## 2024-04-20 DIAGNOSIS — R0601 Orthopnea: Secondary | ICD-10-CM | POA: Diagnosis not present

## 2024-04-20 DIAGNOSIS — I272 Pulmonary hypertension, unspecified: Secondary | ICD-10-CM | POA: Insufficient documentation

## 2024-04-20 DIAGNOSIS — N186 End stage renal disease: Secondary | ICD-10-CM | POA: Insufficient documentation

## 2024-04-20 DIAGNOSIS — I480 Paroxysmal atrial fibrillation: Secondary | ICD-10-CM | POA: Diagnosis not present

## 2024-04-20 DIAGNOSIS — I5022 Chronic systolic (congestive) heart failure: Secondary | ICD-10-CM | POA: Diagnosis not present

## 2024-04-20 DIAGNOSIS — I132 Hypertensive heart and chronic kidney disease with heart failure and with stage 5 chronic kidney disease, or end stage renal disease: Secondary | ICD-10-CM | POA: Insufficient documentation

## 2024-04-20 DIAGNOSIS — I493 Ventricular premature depolarization: Secondary | ICD-10-CM | POA: Insufficient documentation

## 2024-04-20 LAB — COMPREHENSIVE METABOLIC PANEL WITH GFR
ALT: 18 U/L (ref 0–44)
AST: 18 U/L (ref 15–41)
Albumin: 3.2 g/dL — ABNORMAL LOW (ref 3.5–5.0)
Alkaline Phosphatase: 44 U/L (ref 38–126)
Anion gap: 15 (ref 5–15)
BUN: 12 mg/dL (ref 8–23)
CO2: 27 mmol/L (ref 22–32)
Calcium: 9.5 mg/dL (ref 8.9–10.3)
Chloride: 100 mmol/L (ref 98–111)
Creatinine, Ser: 4.51 mg/dL — ABNORMAL HIGH (ref 0.44–1.00)
GFR, Estimated: 10 mL/min — ABNORMAL LOW (ref >60)
Glucose, Bld: 93 mg/dL (ref 70–99)
Potassium: 4.1 mmol/L (ref 3.5–5.1)
Sodium: 142 mmol/L (ref 135–145)
Total Bilirubin: 5.1 mg/dL — ABNORMAL HIGH (ref 0.0–1.2)
Total Protein: 6.7 g/dL (ref 6.5–8.1)

## 2024-04-20 LAB — TSH: TSH: 0.512 u[IU]/mL (ref 0.350–4.500)

## 2024-04-20 MED ORDER — AMIODARONE HCL 200 MG PO TABS: 200.0000 mg | ORAL_TABLET | Freq: Every day | ORAL | 0 refills | Status: DC

## 2024-04-20 MED ORDER — MIDODRINE HCL 10 MG PO TABS: 20.0000 mg | ORAL_TABLET | Freq: Three times a day (TID) | ORAL | 3 refills | Status: AC

## 2024-04-20 MED ORDER — ATORVASTATIN CALCIUM 10 MG PO TABS: 10.0000 mg | ORAL_TABLET | Freq: Every day | ORAL | 3 refills | Status: AC

## 2024-04-20 NOTE — Telephone Encounter (Signed)
-----   Message from Nurse Christa Course sent at 04/20/2024  4:13 PM EDT ----- Good afternoon,  This patient is going to need a refill on her coumadin . Thank you

## 2024-04-20 NOTE — Patient Instructions (Addendum)
 STOP Florinef    Labs done today, your results will be available in MyChart, we will contact you for abnormal readings.  Your physician has requested that you have an echocardiogram. Echocardiography is a painless test that uses sound waves to create images of your heart. It provides your doctor with information about the size and shape of your heart and how well your heart's chambers and valves are working. This procedure takes approximately one hour. There are no restrictions for this procedure. Please do NOT wear cologne, perfume, aftershave, or lotions (deodorant is allowed). Please arrive 15 minutes prior to your appointment time.  Please note: We ask at that you not bring children with you during ultrasound (echo/ vascular) testing. Due to room size and safety concerns, children are not allowed in the ultrasound rooms during exams. Our front office staff cannot provide observation of children in our lobby area while testing is being conducted. An adult accompanying a patient to their appointment will only be allowed in the ultrasound room at the discretion of the ultrasound technician under special circumstances. We apologize for any inconvenience.   Your physician recommends that you schedule a follow-up appointment in: 2 months.  If you have any questions or concerns before your next appointment please send us  a message through [facility] or call our office at [phone].    TO LEAVE A MESSAGE FOR THE NURSE SELECT OPTION 2, PLEASE LEAVE A MESSAGE INCLUDING: YOUR NAME DATE OF BIRTH CALL BACK NUMBER REASON FOR CALL**this is important as we prioritize the call backs  YOU WILL RECEIVE A CALL BACK THE SAME DAY AS LONG AS YOU CALL BEFORE 4:00 PM  At the [facility], you and your health needs are our priority. As part of our continuing mission to provide you with exceptional heart care, we have created designated Provider Care Teams. These Care Teams include your primary  Cardiologist (physician) and Advanced Practice Providers (APPs- Physician Assistants and Nurse Practitioners) who all work together to provide you with the care you need, when you need it.   You may see any of the following providers on your designated Care Team at your next follow up: Dr Jules Oar Dr Peder Bourdon Dr. Alwin Baars Dr. Arta Lark Amy Marijane Shoulders, NP Ruddy Corral, [place] [facility] [place], [place] Dennise Fitz, NP [place] Lee, NP Shawnee Dellen, NP Luster Salters, PharmD Bevely Brush, PharmD   Please be sure to bring in all your medications bottles to every appointment.    Thank you for choosing [facility]

## 2024-04-21 ENCOUNTER — Other Ambulatory Visit (HOSPITAL_BASED_OUTPATIENT_CLINIC_OR_DEPARTMENT_OTHER): Payer: Self-pay | Admitting: Pharmacist Clinician (PhC)/ Clinical Pharmacy Specialist

## 2024-04-21 NOTE — Progress Notes (Signed)
 ID:  Vanessa [name], DOB 1951-02-21, MRN [id number]   Provider location: [place] Type of Visit: Established patient   PCP:  Diamond Formica, PA  HF Cardiologist: Dr. Mitzie Anda  Chief complaint: CHF   HPI: Vanessa [name] is a 73 y.o. with history of nonischemic cardiomyopathy and presents for followup of CHF. Patient has had a low EF recognized since at least 9/14, when echo showed EF 40-45%.  In 07-07-24, her child died, which led to significant stress.  She developed worsening exertional dyspnea and subsequent echo showed a fall in EF to 25-30%.  She had left and right heart catheterization in 3/16 with no significant coronary disease detected and moderately elevated right and left heart filling pressures along with pulmonary venous hypertension.  Echo was done 12/16: EF 45%, diffuse hypokinesis. She had a repeat echo 11/17 => EF 45% with moderately dilated LV, moderate MR.  Echo 11/18 showed EF stable at 45% with mild LV dilation and mildly decreased RV systolic function, moderate MR.   Spironolactone  was stopped due to elevated K. She apparently did not tolerate V[place]assa  well.   Echo was done in 11/19.  EF 30-35% with mild LV hypertrophy, mild-moderately decreased RV systolic function, possible severe MR with restricted posterior leaflet, PASP 50 mmHg, severe LAE.  TEE was done in 11/19 to assess mitral regurgitation.  This showed severe MR with restricted posterior leaflet.   In 1/20, she had Mitraclip with reduction in MR to 1+.    She had echo in 9/20 with EF 30-35%, severe LAE, normal RV, s/p Mitraclip with mild-moderate MR and mild MS.  The IVC was dilated, suggesting volume overload.   She was unable to tolerate Bidil due to lightheadedness.   Echo in 1/21 showed EF 40-45%, RV normal, biatrial dilation, mild mitral stenosis mean 5 mmHg, mild-moderate MR (s/p Mitraclip).  Echo in 1/22 showed EF 40-45% with moderate LV dilation, normal RV size and systolic  function, severe LAE, s/p Mitraclip with probably mild mitral stenosis (mean gradient 7, MVA 2.2 cm^2 by PHT), moderate MR.   Echo in 3/23 showed EF 40-45%, moderate LV dilation, normal RV, normal IVC, s/p MV repair with Mitraclip with mild mitral stenosis mean gradient 7 mmHg, moderate MR.   Echo (9/23) showed EF stable 40-45%, mild MS (mean gradient 7 mmHg) and mild to moderate MR s/p Mitraclip.   Echo in 9/24 showed EF 40-45%, mild LV dilation and mild LVH, mildly decreased RV systolic function, PASP 47 mmHg, s/p Mitraclip with mean gradient 6 mmHg, suspect severe mitral regurgitation, IVC normal, small secundum ASD.  TEE in 10/24 showed EF 40-45%, mild LV dilation, moderate RV dysfunction with mild RV enlargement, severe LAE, 2 Mitraclips in place with mean gradient 4 mmHg and severe eccentric MR from between the 2 clips with ERO 0.54 cm^2 and peak RV-RA gradient 63 mmHg.  I reviewed the TEE with structural heart colleagues, and there does not appear to be room between the 2 existing Mitraclips to place a 3rd clip.  R/LHC arranged (11/24) as part of pre-surgical evaluation for MVR, which showed no significant CAD, normal RA pressure with mildly elevated PCWP, preserved CO and PAPi, and moderate mixed pulmonary arterial and pulmonary venous hypertension, likely due to mitral valve regurgitation.  She had bioprosthetic mitral valve replacement + tricuspid valve repair on 11/29/23.  She developed post-op surgical site bleeding with tamponade and was taken back to the OR the next day.  She had a very difficult course after this with post-op shock, then AKI with CVVH.  She was eventually transitioned to [facility].  She went to CIR and now is home. Echo (3/25) showed EF 35-40%, moderate RV dysfunction with moderate RV enlargement, PASP 50 mmHg, bioprosthetic mitral valve with mean gradient up to 13 mmHg with appearance of a fixed posterior leaflet (?severe stenosis), s/p TV repair with mild-moderate TR, mild AS  with mean gradient 9 mmHg.  She has been back to the ER several times with hypotension with dialysis and is now on midodrine  20 mg tid + Florinef .   Patient returns for followup of CHF.  She remains limited, uses wheelchair to get into the office.  She walks around the house with her walker, gets short of breath with moderate exertion.  No chest pain.  She has 3 pillow orthopnea.  No lightheadedness.  Not doing much walking due to left leg weakness.  Weight down 20 lbs compared to prior to surgery.   ECG (personally reviewed): NSR, 1st degree AVB, IVCD 126 msec  Labs (8/23): K 6.1 => 5.2, creatinine 1.95 => 2.88 => 2.21, BUN 120 => 111 Labs (10/23): K 4.6, creatinine 1.66 Labs (12/23): K 4.5, creatinine 2.43 Labs (2/24): K 4, creatinine 1.7, BNP 775 Labs (8/24): SPEP negative, K 5.4, creatinine 1.58, TSH normal Labs (9/24): BNP 781, K 4.2, creatinine 1.66 Labs (10/24): K 4.0, creatinine 1.78 Labs (3/25): hgb 11.6  PMH: 1. Cardiomyopathy: Nonischemic.  Echo (9/14) with EF 40-45%.  Echo (2/16) with EF 25-30%.  Echo (4/16) with EF 30-35%, moderate MR.  Echo (6/16) with EF 40%, moderate LV dilation, moderate diastolic dysfunction, moderate MR, moderate TR, PA systolic pressure 52 mmHg. RHC/LHC (3/16) with no significant CAD, EF 20-25%, 3+ MR; mean RA 13, PA 70/30 mean 45, mean PCWP 31 mmHg, CI 2.2, PVR 3.1 WU.  Echo (12/16) with EF 45%, diffuse hypokinesis, moderate MR, normal RV size and systolic function.  - Echo (11/17): EF 45%, moderately dilated LV, moderate eccentric posterior MR, PASP 42 mmHg, normal RV size and systolic function.  - Echo (11/18): EF 45%, mild LV dilation, moderate diastolic dysfunction, normal RV size with mildly decreased systolic function, PASP 50 mmHg, moderate MR.  - Echo (11/19): EF 30-35%, mild LV dilation, severe LAE, mild-moderately decreased RV systolic function, possible severe MR with restricted posterior leaflet, PASP 50 mmHg.  - Echo (1/20): EF 30-35%, severe LV  dilation, s/p Mitraclip with mild MR and mild MS, PASP 48 mmHg.  - RHC (1/20, with Mitraclip): mean RA 10, mean LA 18 - Echo (2/20): EF 30-35%, s/p mitraclip with mild mitral stenosis, mean MV gradient 5 mmHg, moderate MR, PASP 56 mmHg.  - Echo (9/20): EF 30-35%, severe LAE, normal RV, s/p Mitraclip with mild-moderate MR and mild MS.  The IVC was dilated, suggesting volume overload.  - Echo (1/21): EF 40-45%, RV normal, biatrial dilation, mild mitral stenosis mean 5 mmHg, mild-moderate MR (s/p Mitraclip).  - Echo (1/22): EF 40-45% with moderate LV dilation, normal RV size and systolic function, severe LAE, s/p Mitraclip with probably mild mitral stenosis (mean gradient 7, MVA 2.2 cm^2 by PHT), moderate MR. - Echo (3/23): EF 40-45%, moderate LV dilation, normal RV, normal IVC, s/p MV repair with Mitraclip with mild mitral stenosis mean gradient 7 mmHg, moderate MR.  - Echo (9/23): EF 40-45%, grade I DD, normal RV, s/p MC repair with Mitraclip with mild mitral stenosis mean gradient 7 mmHg, moderate MR. - Echo (9/24):  EF 40-45%, mild LV dilation and mild LVH, mildly decreased RV systolic function, PASP 47 mmHg, s/p Mitraclip with mean gradient 6 mmHg, suspect severe mitral regurgitation, IVC normal, small secundum ASD.  - TEE (10/24): EF 40-45%, mild LV dilation, moderate RV dysfunction with mild RV enlargement, severe LAE, 2 Mitraclips in place with mean gradient 4 mmHg and severe eccentric MR from between the 2 clips with ERO 0.54 cm^2 and peak RV-RA gradient 63 mmHg. - R/LHC (11/24): no significant CAD; RA 5, PA 61/19 (39), PCWP mean 18, CO/CI (Fick) 4.67/2.52, PVR 4.5 WU - Echo (3/25): EF 35-40%, moderate RV dysfunction with moderate RV enlargement, PASP 50 mmHg, bioprosthetic mitral valve with mean gradient up to 13 mmHg with appearance of a fixed posterior leaflet (?severe stenosis), s/p TV repair with mild-moderate TR, mild AS with mean gradient 9 mmHg.  2. PVCs: Holter (10/15) with 12% PVCs. 3.  Pulmonary venous hypertension: CTA chest (7/16) negative for PE. PFTs (7/16) with minimal obstruction, mild restriction, moderately decreased DLCO.  4. Thyroid  nodules: Biopsy benign.  5. ESRD: Post-op MVR.   6. Mitral regurgitation: TEE (11/19) with EF 30-35%, moderate LV dilation, severe MR with posterior leaflet restriction and ERO 0.41 cm^2, no mitral stenosis, mild to moderately decreased RV systolic function, peak RV-RA gradient 50 mmHg.  - Mitraclip placement x 2 in 1/20 with 1+ residual MR.  - Echo (2/20) with moderate residual MR, mild MS with mean gradient 5 mmHg.  - Echo (9/20) with mild-moderate MR, mild MS.  - Echo (1/21) with mild-moderate MR, mild MS s/p Mitraclip - Echo (1/22) with moderate residual MR, mild-moderate mitral stenosis.  - Echo (3/23) with moderate MR, mild MS s/p Mitraclip.  - Echo (9/23) with mild-moderate MR, mild MS mean gradient 7 mmHg - Echo (9/24) with severe MR, mild MS mean gradient 6 mmHg post-Mitraclip.  - TEE (10/24): with 2 Mitraclips in place with mean gradient 4 mmHg and severe eccentric MR from between the 2 clips with ERO 0.54 cm^2. - Bioprosthetic mitral valve replacement + tricuspid valve repair on 11/29/23. 7. Hyperthyroism: Toxic multinodular goiter.  8. Atrial fibrillation: Paroxysmal.   SH: Lives alone, only child passed away in 07/02/24, nonsmoker, no ETOH or drugs.   FH: Father with MIs  ROS: All systems reviewed and negative except as per HPI.   Current Outpatient Medications  Medication Sig Dispense Refill   ascorbic acid  (VITAMIN C ) 500 MG tablet Take 0.5 tablets (250 mg total) by mouth 2 (two) times daily. 30 tablet 0   docusate sodium  (COLACE) 100 MG capsule Take 100 mg by mouth as needed for mild constipation.     polyethylene glycol powder (GLYCOLAX /MIRALAX ) 17 GM/SCOOP powder Take 17 g by mouth daily as needed. 476 g 0   torsemide  (DEMADEX ) 100 MG tablet Take 1 tablet (100 mg total) by mouth every Tuesday, Thursday, and Saturday  at 6 PM. 20 tablet 1   warfarin (COUMADIN ) 5 MG tablet Take 1 tablet (5 mg total) by mouth daily at 4 PM. 30 tablet 0   amiodarone  (PACERONE ) 200 MG tablet Take 1 tablet (200 mg total) by mouth daily. 30 tablet 0   atorvastatin  (LIPITOR) 10 MG tablet Take 1 tablet (10 mg total) by mouth daily. 90 tablet 3   midodrine  (PROAMATINE ) 10 MG tablet Take 2 tablets (20 mg total) by mouth 3 (three) times daily with meals. 350 tablet 3   No current facility-administered medications for this encounter.   BP 104/60   Pulse 77  Wt 73.2 kg (161 lb 6.4 oz)   SpO2 94%   BMI 28.59 kg/m    Wt Readings from Last 3 Encounters:  04/20/24 73.2 kg (161 lb 6.4 oz)  02/18/24 68.2 kg (150 lb 5.7 oz)  02/11/24 66.1 kg (145 lb 11.6 oz)  General: NAD Neck: JVP 12-14 cm, no thyromegaly or thyroid  nodule.  Lungs: Distant BS CV: Nondisplaced PMI.  Heart regular S1/S2, no S3/S4, no murmur.  1+ edema to knees.  No carotid bruit.  Difficult to palpate pedal pulses.  Abdomen: Soft, nontender, no hepatosplenomegaly, no distention.  Skin: Intact without lesions or rashes.  Neurologic: Alert and oriented x 3.  Psych: Normal affect. Extremities: No clubbing or cyanosis.  HEENT: Normal.   Assessment/Plan: 1. Chronic systolic CHF: Long history of nonischemic cardiomyopathy and mitral regurgitation.  Bioprosthetic MVR and tricuspid ring 11/29/23 with Dr. Honey Lusty. Pre-op cath with no significant CAD.  Pre-op echo with EF 40-45%, severe MR. Post-op shock/AKI, went back to the OR with post-op tamponade for clean-out.  Required CVVH then iHD. Post-op echo 3/25 with EF 35-40%, moderate RV dysfunction with moderate RV enlargement, PASP 50 mmHg, bioprosthetic mitral valve with mean gradient up to 13 mmHg with appearance of a fixed posterior leaflet (?severe stenosis), s/p TV repair with mild-moderate TR, mild AS with mean gradient 9 mmHg.  Patient is now at home after prolonged hospitalization and rehab stay. She is weak, requires  midodrine .  Volume managed with CVVH.  She is significantly volume overloaded on exam.  - Continue midodrine  20 mg tid, BP stable today.  - I would like her to stop fludrocortisone .  - Needs more fluid off with HD, may need 4 days/week if cannot pull more off during individual sessions.  2. Mitral regurgitation:  TEE in 11/19 with severe MR, restricted posterior leaflet.  She had Mitraclip x 2 in 1/20. Post-op echo showed mild MR and mild mitral stenosis.   Echo in 9/24 showed MV s/p Mitraclip x 2 with mean gradient 6 mmHg and severe mitral regurgitation. TEE was then done to evaluate, showing 2 Mitraclips in place with mean gradient 4 mmHg and severe eccentric MR from between the 2 clips with ERO 0.54 cm^2.  She was not thought to be a candidate for an additional Mitraclip due to lack of room between the two existing clips. R/LHC (11/24) showed moderate mixed pulmonary arterial and pulmonary venous hypertension likely due to mitral valve regurgitation.  S/p MV replacement and tricuspid repair on 11/29/23 with Dr. Honey Lusty. Echo in 3/25 showed bioprosthetic mitral valve with mean gradient up to 13 mmHg with appearance of a fixed posterior leaflet (?severe stenosis). She has been adequately anticoagulated with warfarin.  - I am going to repeat echo to reassess given increased bioprosthetic MV gradient on last study.  - She would not be a candidate for redo surgery and is not interested in invasive procedure such as TMVR.  3. PVCs:  - Continue amiodarone  200 mg daily, check LFTs and TSH today and will need regular eye exam while on amiodarone .  4. Pulmonary hypertension: Pre-op RHC (11/24) with PVR 4.5, with moderate mixed pulmonary arterial and pulmonary venous hypertension likely due to mitral valve regurgitation. s/p MVR, PA pressure severely elevated post-op.  Suspect still mixed PAH/PVH with elevated PCWP.  Will hold off on pulmonary vasodilators; small clinical trials demonstrate no improvement and some  markers of harm with PDE5i in PH in the setting of mitral valve disease. RHC 12/17/23 with mild pulmonary arterial  hypertension.  5. Toxic multinodular goiter: She is now off methimazole . Follows with Endocrinology - TSH today.  6. ESRD: Developed post-op MVR.  She has struggled with HD due to RV failure and hypotension.  Currently requiring midodrine  20 mg tid. She remains significantly volume overloaded.  - Continue midodrine .  - Need to try to get more fluid off, either by increasing fluid pulled each session or increasing to 4 sessions/week.  7. Atrial fibrillation: Paroxysmal. NSR today.  - Continue po amiodarone .   - Continue warfarin.   Long discussion with patient, she had a very difficult course post-MVR and is now very debilitated and getting HD.  She does not want future invasive procedures.   Followup 2 months with APP.  I spent 41 minutes reviewing records, interviewing/examining patient, and managing orders.    Signed, Peder Bourdon, MD  04/21/2024  [facility] [address] [facility] [place] [address] [phone] (office) [phone] (fax)

## 2024-04-25 ENCOUNTER — Other Ambulatory Visit: Payer: Self-pay

## 2024-04-25 NOTE — Telephone Encounter (Signed)
 Vanessa Inch, RN sent to [facility] Good afternoon,  This patient is going to need a refill on her coumadin . Thank you  Pt needs INR follow-up scheduled. Called spoke with pt made appt for 04/27/24 at 11:30am, will refill Warfarin at that time.  Pt aware of new [address] location 5th floor.

## 2024-04-27 ENCOUNTER — Ambulatory Visit: Attending: Cardiology

## 2024-04-27 DIAGNOSIS — Z5181 Encounter for therapeutic drug level monitoring: Secondary | ICD-10-CM | POA: Diagnosis not present

## 2024-04-27 DIAGNOSIS — Z952 Presence of prosthetic heart valve: Secondary | ICD-10-CM

## 2024-04-27 LAB — POCT INR: INR: 5.1 — AB (ref 2.0–3.0)

## 2024-04-27 MED ORDER — WARFARIN SODIUM 5 MG PO TABS: ORAL_TABLET | ORAL | 1 refills | Status: DC

## 2024-04-27 NOTE — Patient Instructions (Signed)
 Description   HOLD Warfarin today and tomorrow and then START taking 1 tablet daily EXCEPT 1/2 tablet on Mondays and Fridays.  Stay consistent with greens each week (2 per week)  Recheck INR in 1 week.   Coumadin  Clinic [phone];  Dialysis M-W-F, Amiodarone  200 mg daily

## 2024-05-04 ENCOUNTER — Ambulatory Visit: Attending: Cardiology | Admitting: *Deleted

## 2024-05-04 DIAGNOSIS — Z5181 Encounter for therapeutic drug level monitoring: Secondary | ICD-10-CM

## 2024-05-04 DIAGNOSIS — Z952 Presence of prosthetic heart valve: Secondary | ICD-10-CM

## 2024-05-04 LAB — POCT INR: POC INR: 2.1

## 2024-05-04 NOTE — Patient Instructions (Signed)
 Description   Start taking warfarin 1/2 a tablet daily except for 1 tablet on Sunday and Thursdays.  Recheck INR in 1 week.   Coumadin  Clinic [phone];  Dialysis M-W-F, Amiodarone  200 mg daily

## 2024-05-11 ENCOUNTER — Ambulatory Visit: Attending: Cardiology | Admitting: *Deleted

## 2024-05-11 DIAGNOSIS — Z952 Presence of prosthetic heart valve: Secondary | ICD-10-CM

## 2024-05-11 DIAGNOSIS — Z5181 Encounter for therapeutic drug level monitoring: Secondary | ICD-10-CM

## 2024-05-11 LAB — POCT INR: POC INR: 1.6

## 2024-05-11 NOTE — Patient Instructions (Addendum)
 Description   Take 1.5 tablets total of warfarin today and then Start taking warfarin 1/2 a tablet daily except for 1 tablet on Sundays, Tuesday and Thursdays.  Recheck INR in 2 weeks- per pt request.  Coumadin  Clinic [phone];  Dialysis M-W-F, Amiodarone  200 mg daily

## 2024-05-19 ENCOUNTER — Other Ambulatory Visit (HOSPITAL_COMMUNITY): Payer: Self-pay | Admitting: Cardiology

## 2024-05-25 ENCOUNTER — Ambulatory Visit: Attending: Cardiology

## 2024-05-25 DIAGNOSIS — Z952 Presence of prosthetic heart valve: Secondary | ICD-10-CM

## 2024-05-25 LAB — POCT INR: INR: 2.7 (ref 2.0–3.0)

## 2024-05-25 NOTE — Progress Notes (Signed)
Please see anticoagulation encounter.

## 2024-05-25 NOTE — Patient Instructions (Signed)
 Description   Continue taking warfarin 1/2 a tablet daily except for 1 tablet on Sundays, Tuesday and Thursdays. Stay consistent with greens (1 per week)   Recheck INR in 2 weeks- per pt request.  Coumadin  Clinic [phone];  Dialysis M-W-F, Amiodarone  200 mg daily

## 2024-06-08 ENCOUNTER — Ambulatory Visit: Attending: Cardiology

## 2024-06-08 DIAGNOSIS — Z952 Presence of prosthetic heart valve: Secondary | ICD-10-CM

## 2024-06-08 LAB — POCT INR: INR: 2.9 (ref 2.0–3.0)

## 2024-06-08 NOTE — Progress Notes (Signed)
Please see anticoagulation encounter.

## 2024-06-08 NOTE — Patient Instructions (Signed)
 Description   Continue taking warfarin 1/2 a tablet daily except for 1 tablet on Sundays, Tuesday and Thursdays. Stay consistent with greens (1 per week)   Recheck INR in 2 weeks- per pt request.  Coumadin  Clinic [phone];  Dialysis M-W-F, Amiodarone  200 mg daily

## 2024-06-22 ENCOUNTER — Ambulatory Visit (INDEPENDENT_AMBULATORY_CARE_PROVIDER_SITE_OTHER)

## 2024-06-22 ENCOUNTER — Encounter (HOSPITAL_COMMUNITY)

## 2024-06-22 ENCOUNTER — Ambulatory Visit (HOSPITAL_COMMUNITY)
Admission: RE | Admit: 2024-06-22 | Discharge: 2024-06-22 | Disposition: A | Discharge status: Home / Self Care | Source: Ambulatory Visit | Attending: Physician Assistant | Admitting: Physician Assistant

## 2024-06-22 DIAGNOSIS — Z5181 Encounter for therapeutic drug level monitoring: Secondary | ICD-10-CM | POA: Diagnosis not present

## 2024-06-22 DIAGNOSIS — Z952 Presence of prosthetic heart valve: Secondary | ICD-10-CM | POA: Insufficient documentation

## 2024-06-22 DIAGNOSIS — I272 Pulmonary hypertension, unspecified: Secondary | ICD-10-CM | POA: Diagnosis not present

## 2024-06-22 DIAGNOSIS — I5022 Chronic systolic (congestive) heart failure: Secondary | ICD-10-CM

## 2024-06-22 DIAGNOSIS — I509 Heart failure, unspecified: Secondary | ICD-10-CM | POA: Insufficient documentation

## 2024-06-22 DIAGNOSIS — I110 Hypertensive heart disease with heart failure: Secondary | ICD-10-CM | POA: Diagnosis not present

## 2024-06-22 LAB — ECHOCARDIOGRAM COMPLETE
AR max vel: 1.18 cm2
AV Area VTI: 1.26 cm2
AV Area mean vel: 1.11 cm2
AV Mean grad: 8 mmHg
AV Peak grad: 13 mmHg
Ao pk vel: 1.8 m/s
Calc EF: 31.1 %
MV VTI: 1.06 cm2
S' Lateral: 4.8 cm
Single Plane A2C EF: 23.6 %
Single Plane A4C EF: 35.1 %

## 2024-06-22 LAB — POCT INR: INR: 2.8 (ref 2.0–3.0)

## 2024-06-22 NOTE — Progress Notes (Incomplete)
 ID:  Vanessa [name], DOB 07/15/1951, MRN [id number]   Provider location: [facility] Type of Visit: Established patient   PCP:  Alvera Reagin, PA  HF Cardiologist: Dr. Rolan  Chief complaint: CHF   HPI: Vanessa [name] is a 73 y.o. with history of nonischemic cardiomyopathy and presents for followup of CHF. Patient has had a low EF recognized since at least 9/14, when echo showed EF 40-45%.  In 07-13-24, her child died, which led to significant stress.  She developed worsening exertional dyspnea and subsequent echo showed a fall in EF to 25-30%.  She had left and right heart catheterization in 3/16 with no significant coronary disease detected and moderately elevated right and left heart filling pressures along with pulmonary venous hypertension.  Echo was done 12/16: EF 45%, diffuse hypokinesis. She had a repeat echo 11/17 => EF 45% with moderately dilated LV, moderate MR.  Echo 11/18 showed EF stable at 45% with mild LV dilation and mildly decreased RV systolic function, moderate MR.   Spironolactone  was stopped due to elevated K. She apparently did not tolerate Veltassa  well.   Echo was done in 11/19.  EF 30-35% with mild LV hypertrophy, mild-moderately decreased RV systolic function, possible severe MR with restricted posterior leaflet, PASP 50 mmHg, severe LAE.  TEE was done in 11/19 to assess mitral regurgitation.  This showed severe MR with restricted posterior leaflet.   In 1/20, she had Mitraclip with reduction in MR to 1+.    She had echo in 9/20 with EF 30-35%, severe LAE, normal RV, s/p Mitraclip with mild-moderate MR and mild MS.  The IVC was dilated, suggesting volume overload.   She was unable to tolerate Bidil due to lightheadedness.   Echo in 1/21 showed EF 40-45%, RV normal, biatrial dilation, mild mitral stenosis mean 5 mmHg, mild-moderate MR (s/p Mitraclip).  Echo in 1/22 showed EF 40-45% with moderate LV dilation, normal RV size and systolic  function, severe LAE, s/p Mitraclip with probably mild mitral stenosis (mean gradient 7, MVA 2.2 cm^2 by PHT), moderate MR.   Echo in 3/23 showed EF 40-45%, moderate LV dilation, normal RV, normal IVC, s/p MV repair with Mitraclip with mild mitral stenosis mean gradient 7 mmHg, moderate MR.   Echo (9/23) showed EF stable 40-45%, mild MS (mean gradient 7 mmHg) and mild to moderate MR s/p Mitraclip.   Echo in 9/24 showed EF 40-45%, mild LV dilation and mild LVH, mildly decreased RV systolic function, PASP 47 mmHg, s/p Mitraclip with mean gradient 6 mmHg, suspect severe mitral regurgitation, IVC normal, small secundum ASD.  TEE in 10/24 showed EF 40-45%, mild LV dilation, moderate RV dysfunction with mild RV enlargement, severe LAE, 2 Mitraclips in place with mean gradient 4 mmHg and severe eccentric MR from between the 2 clips with ERO 0.54 cm^2 and peak RV-RA gradient 63 mmHg.  I reviewed the TEE with structural heart colleagues, and there does not appear to be room between the 2 existing Mitraclips to place a 3rd clip.  R/LHC arranged (11/24) as part of pre-surgical evaluation for MVR, which showed no significant CAD, normal RA pressure with mildly elevated PCWP, preserved CO and PAPi, and moderate mixed pulmonary arterial and pulmonary venous hypertension, likely due to mitral valve regurgitation.  She had bioprosthetic mitral valve replacement + tricuspid valve repair on 11/29/23.  She developed post-op surgical site bleeding with tamponade and was taken back to the OR the next day.  She had a very difficult course after this with post-op shock, then AKI with CVVH.  She was eventually transitioned to [facility].  She went to CIR and now is home. Echo (3/25) showed EF 35-40%, moderate RV dysfunction with moderate RV enlargement, PASP 50 mmHg, bioprosthetic mitral valve with mean gradient up to 13 mmHg with appearance of a fixed posterior leaflet (?severe stenosis), s/p TV repair with mild-moderate TR, mild AS  with mean gradient 9 mmHg.  She has been back to the ER several times with hypotension with dialysis and is now on midodrine  20 mg tid + Florinef .   Here today for CHF follow-up.   Patient returns for followup of CHF.  She remains limited, uses wheelchair to get into the office.  She walks around the house with her walker, gets short of breath with moderate exertion.  No chest pain.  She has 3 pillow orthopnea.  No lightheadedness.  Not doing much walking due to left leg weakness.  Weight down 20 lbs compared to prior to surgery.   ECG (personally reviewed):    PMH: 1. Cardiomyopathy: Nonischemic.  Echo (9/14) with EF 40-45%.  Echo (2/16) with EF 25-30%.  Echo (4/16) with EF 30-35%, moderate MR.  Echo (6/16) with EF 40%, moderate LV dilation, moderate diastolic dysfunction, moderate MR, moderate TR, PA systolic pressure 52 mmHg. RHC/LHC (3/16) with no significant CAD, EF 20-25%, 3+ MR; mean RA 13, PA 70/30 mean 45, mean PCWP 31 mmHg, CI 2.2, PVR 3.1 WU.  Echo (12/16) with EF 45%, diffuse hypokinesis, moderate MR, normal RV size and systolic function.  - Echo (11/17): EF 45%, moderately dilated LV, moderate eccentric posterior MR, PASP 42 mmHg, normal RV size and systolic function.  - Echo (11/18): EF 45%, mild LV dilation, moderate diastolic dysfunction, normal RV size with mildly decreased systolic function, PASP 50 mmHg, moderate MR.  - Echo (11/19): EF 30-35%, mild LV dilation, severe LAE, mild-moderately decreased RV systolic function, possible severe MR with restricted posterior leaflet, PASP 50 mmHg.  - Echo (1/20): EF 30-35%, severe LV dilation, s/p Mitraclip with mild MR and mild MS, PASP 48 mmHg.  - RHC (1/20, with Mitraclip): mean RA 10, mean LA 18 - Echo (2/20): EF 30-35%, s/p mitraclip with mild mitral stenosis, mean MV gradient 5 mmHg, moderate MR, PASP 56 mmHg.  - Echo (9/20): EF 30-35%, severe LAE, normal RV, s/p Mitraclip with mild-moderate MR and mild MS.  The IVC was dilated,  suggesting volume overload.  - Echo (1/21): EF 40-45%, RV normal, biatrial dilation, mild mitral stenosis mean 5 mmHg, mild-moderate MR (s/p Mitraclip).  - Echo (1/22): EF 40-45% with moderate LV dilation, normal RV size and systolic function, severe LAE, s/p Mitraclip with probably mild mitral stenosis (mean gradient 7, MVA 2.2 cm^2 by PHT), moderate MR. - Echo (3/23): EF 40-45%, moderate LV dilation, normal RV, normal IVC, s/p MV repair with Mitraclip with mild mitral stenosis mean gradient 7 mmHg, moderate MR.  - Echo (9/23): EF 40-45%, grade I DD, normal RV, s/p MC repair with Mitraclip with mild mitral stenosis mean gradient 7 mmHg, moderate MR. - Echo (9/24): EF 40-45%, mild LV dilation and mild LVH, mildly decreased RV systolic function, PASP 47 mmHg, s/p Mitraclip with mean gradient 6 mmHg, suspect severe mitral regurgitation, IVC normal, small secundum ASD.  - TEE (10/24): EF 40-45%, mild LV dilation, moderate RV dysfunction with mild RV enlargement, severe LAE, 2 Mitraclips in place with mean gradient 4 mmHg and severe eccentric MR  from between the 2 clips with ERO 0.54 cm^2 and peak RV-RA gradient 63 mmHg. - R/LHC (11/24): no significant CAD; RA 5, PA 61/19 (39), PCWP mean 18, CO/CI (Fick) 4.67/2.52, PVR 4.5 WU - Echo (3/25): EF 35-40%, moderate RV dysfunction with moderate RV enlargement, PASP 50 mmHg, bioprosthetic mitral valve with mean gradient up to 13 mmHg with appearance of a fixed posterior leaflet (?severe stenosis), s/p TV repair with mild-moderate TR, mild AS with mean gradient 9 mmHg.  2. PVCs: Holter (10/15) with 12% PVCs. 3. Pulmonary venous hypertension: CTA chest (7/16) negative for PE. PFTs (7/16) with minimal obstruction, mild restriction, moderately decreased DLCO.  4. Thyroid  nodules: Biopsy benign.  5. ESRD: Post-op MVR.   6. Mitral regurgitation: TEE (11/19) with EF 30-35%, moderate LV dilation, severe MR with posterior leaflet restriction and ERO 0.41 cm^2, no mitral  stenosis, mild to moderately decreased RV systolic function, peak RV-RA gradient 50 mmHg.  - Mitraclip placement x 2 in 1/20 with 1+ residual MR.  - Echo (2/20) with moderate residual MR, mild MS with mean gradient 5 mmHg.  - Echo (9/20) with mild-moderate MR, mild MS.  - Echo (1/21) with mild-moderate MR, mild MS s/p Mitraclip - Echo (1/22) with moderate residual MR, mild-moderate mitral stenosis.  - Echo (3/23) with moderate MR, mild MS s/p Mitraclip.  - Echo (9/23) with mild-moderate MR, mild MS mean gradient 7 mmHg - Echo (9/24) with severe MR, mild MS mean gradient 6 mmHg post-Mitraclip.  - TEE (10/24): with 2 Mitraclips in place with mean gradient 4 mmHg and severe eccentric MR from between the 2 clips with ERO 0.54 cm^2. - Bioprosthetic mitral valve replacement + tricuspid valve repair on 11/29/23. 7. Hyperthyroism: Toxic multinodular goiter.  8. Atrial fibrillation: Paroxysmal.   SH: Lives alone, only child passed away in 07/03/24, nonsmoker, no ETOH or drugs.   FH: Father with MIs  ROS: All systems reviewed and negative except as per HPI.   Current Outpatient Medications  Medication Sig Dispense Refill   amiodarone  (PACERONE ) 200 MG tablet Take 1 tablet by mouth once daily 30 tablet 0   ascorbic acid  (VITAMIN C ) 500 MG tablet Take 0.5 tablets (250 mg total) by mouth 2 (two) times daily. 30 tablet 0   atorvastatin  (LIPITOR) 10 MG tablet Take 1 tablet (10 mg total) by mouth daily. 90 tablet 3   docusate sodium  (COLACE) 100 MG capsule Take 100 mg by mouth as needed for mild constipation.     midodrine  (PROAMATINE ) 10 MG tablet Take 2 tablets (20 mg total) by mouth 3 (three) times daily with meals. 350 tablet 3   polyethylene glycol powder (GLYCOLAX /MIRALAX ) 17 GM/SCOOP powder Take 17 g by mouth daily as needed. 476 g 0   torsemide  (DEMADEX ) 100 MG tablet Take 1 tablet (100 mg total) by mouth every Tuesday, Thursday, and Saturday at 6 PM. 20 tablet 1   warfarin (COUMADIN ) 5 MG tablet  TAKE 1/2 TABLET TO 1 TABLET BY MOUTH DAILY AS DIRECTED BY COUMADIN  CLINIC 30 tablet 1   No current facility-administered medications for this visit.   There were no vitals taken for this visit.   Wt Readings from Last 3 Encounters:  04/20/24 73.2 kg (161 lb 6.4 oz)  02/18/24 68.2 kg (150 lb 5.7 oz)  02/11/24 66.1 kg (145 lb 11.6 oz)  General: NAD Neck: JVP 12-14 cm, no thyromegaly or thyroid  nodule.  Lungs: Distant BS CV: Nondisplaced PMI.  Heart regular S1/S2, no S3/S4, no murmur.  1+  edema to knees.  No carotid bruit.  Difficult to palpate pedal pulses.  Abdomen: Soft, nontender, no hepatosplenomegaly, no distention.  Skin: Intact without lesions or rashes.  Neurologic: Alert and oriented x 3.  Psych: Normal affect. Extremities: No clubbing or cyanosis.  HEENT: Normal.   Assessment/Plan: 1. Chronic systolic CHF: Long history of nonischemic cardiomyopathy and mitral regurgitation.  Bioprosthetic MVR and tricuspid ring 11/29/23 with Dr. Maryjane. Pre-op cath with no significant CAD.  Pre-op echo with EF 40-45%, severe MR. Post-op shock/AKI, went back to the OR with post-op tamponade for clean-out.  Required CVVH then iHD. Post-op echo 3/25 with EF 35-40%, moderate RV dysfunction with moderate RV enlargement, PASP 50 mmHg, bioprosthetic mitral valve with mean gradient up to 13 mmHg with appearance of a fixed posterior leaflet (?severe stenosis), s/p TV repair with mild-moderate TR, mild AS with mean gradient 9 mmHg.  Patient is now at home after prolonged hospitalization and rehab stay. She is weak, requires midodrine .  Volume managed with CVVH and takes 100 mg Torsemide  on non-HD days - Continue midodrine  20 mg tid, BP stable today.  - Remains off fludrocortisone  *** 2. Mitral regurgitation:  TEE in 11/19 with severe MR, restricted posterior leaflet.  She had Mitraclip x 2 in 1/20. Post-op echo showed mild MR and mild mitral stenosis.   Echo in 9/24 showed MV s/p Mitraclip x 2 with mean  gradient 6 mmHg and severe mitral regurgitation. TEE was then done to evaluate, showing 2 Mitraclips in place with mean gradient 4 mmHg and severe eccentric MR from between the 2 clips with ERO 0.54 cm^2.  She was not thought to be a candidate for an additional Mitraclip due to lack of room between the two existing clips. R/LHC (11/24) showed moderate mixed pulmonary arterial and pulmonary venous hypertension likely due to mitral valve regurgitation.  S/p MV replacement and tricuspid repair on 11/29/23 with Dr. Maryjane. Echo in 3/25 showed bioprosthetic mitral valve with mean gradient up to 13 mmHg with appearance of a fixed posterior leaflet (?severe stenosis). She has been adequately anticoagulated with warfarin.  - Echo today *** - She would not be a candidate for redo surgery and is not interested in invasive procedure such as TMVR.  3. PVCs:  - Continue amiodarone  200 mg daily, check LFTs and TSH today and will need regular eye exam while on amiodarone .  4. Pulmonary hypertension: Pre-op RHC (11/24) with PVR 4.5, with moderate mixed pulmonary arterial and pulmonary venous hypertension likely due to mitral valve regurgitation. s/p MVR, PA pressure severely elevated post-op.  Suspect still mixed PAH/PVH with elevated PCWP.  Will hold off on pulmonary vasodilators; small clinical trials demonstrate no improvement and some markers of harm with PDE5i in PH in the setting of mitral valve disease. RHC 12/17/23 with mild pulmonary arterial hypertension.  5. Toxic multinodular goiter: She is now off methimazole . Follows with Endocrinology - TSH today.  6. ESRD: Developed post-op MVR.  She has struggled with HD due to RV failure and hypotension.  Currently requiring midodrine  20 mg tid.  - Continue midodrine .  - Need to try to get more fluid off, either by increasing fluid pulled each session or increasing to 4 sessions/week.  7. Atrial fibrillation: Paroxysmal. NSR today.  - Continue po amiodarone .   -  Continue warfarin.   She had a very difficult course post-MVR and is now very debilitated and getting HD.  She does not want future invasive procedures.   Followup ***  I spent ***  minutes reviewing records, interviewing/examining patient, and managing orders.    Bonney COLLETTA MANUELITA LOISE, PA-C  06/22/2024

## 2024-06-22 NOTE — Progress Notes (Signed)
 INR-2.8 Please see anticoagulation encounter

## 2024-06-22 NOTE — Patient Instructions (Signed)
 Description   Continue taking warfarin 1/2 a tablet daily except for 1 tablet on Sundays, Tuesday and Thursdays. Stay consistent with greens (1 per week)   Recheck INR in 3 weeks   Coumadin  Clinic [phone]  Dialysis M-W-F, Amiodarone  200 mg daily

## 2024-06-26 ENCOUNTER — Telehealth (HOSPITAL_COMMUNITY): Payer: Self-pay

## 2024-06-26 ENCOUNTER — Other Ambulatory Visit: Payer: Self-pay

## 2024-06-26 DIAGNOSIS — N186 End stage renal disease: Secondary | ICD-10-CM

## 2024-06-26 NOTE — Telephone Encounter (Signed)
 Called to confirm/remind patient of their appointment at the [facility] on 06/27/2024 9:00.   Appointment:   [x] Confirmed  [] Left mess   [] No answer/No voice mail  [] VM Full/unable to leave message  [] Phone not in service  Patient reminded to bring all medications and/or complete list.  Confirmed patient has transportation. Gave directions, instructed to utilize valet parking.

## 2024-06-27 ENCOUNTER — Ambulatory Visit (HOSPITAL_COMMUNITY): Payer: Self-pay | Admitting: Physician Assistant

## 2024-06-27 ENCOUNTER — Ambulatory Visit (HOSPITAL_COMMUNITY)
Admission: RE | Admit: 2024-06-27 | Discharge: 2024-06-27 | Disposition: A | Discharge status: Home / Self Care | Source: Ambulatory Visit | Attending: Physician Assistant | Admitting: Physician Assistant

## 2024-06-27 ENCOUNTER — Encounter (HOSPITAL_COMMUNITY): Payer: Self-pay

## 2024-06-27 VITALS — BP 122/70 | HR 75 | Ht 63.0 in | Wt 149.2 lb

## 2024-06-27 DIAGNOSIS — I5022 Chronic systolic (congestive) heart failure: Secondary | ICD-10-CM | POA: Diagnosis not present

## 2024-06-27 DIAGNOSIS — I480 Paroxysmal atrial fibrillation: Secondary | ICD-10-CM | POA: Insufficient documentation

## 2024-06-27 DIAGNOSIS — I052 Rheumatic mitral stenosis with insufficiency: Secondary | ICD-10-CM | POA: Diagnosis present

## 2024-06-27 DIAGNOSIS — I2721 Secondary pulmonary arterial hypertension: Secondary | ICD-10-CM | POA: Insufficient documentation

## 2024-06-27 DIAGNOSIS — Z953 Presence of xenogenic heart valve: Secondary | ICD-10-CM | POA: Insufficient documentation

## 2024-06-27 DIAGNOSIS — I493 Ventricular premature depolarization: Secondary | ICD-10-CM | POA: Diagnosis not present

## 2024-06-27 DIAGNOSIS — Z992 Dependence on renal dialysis: Secondary | ICD-10-CM | POA: Insufficient documentation

## 2024-06-27 DIAGNOSIS — I340 Nonrheumatic mitral (valve) insufficiency: Secondary | ICD-10-CM

## 2024-06-27 DIAGNOSIS — I428 Other cardiomyopathies: Secondary | ICD-10-CM | POA: Diagnosis present

## 2024-06-27 DIAGNOSIS — N186 End stage renal disease: Secondary | ICD-10-CM | POA: Diagnosis not present

## 2024-06-27 DIAGNOSIS — I272 Pulmonary hypertension, unspecified: Secondary | ICD-10-CM | POA: Diagnosis not present

## 2024-06-27 DIAGNOSIS — E0520 Thyrotoxicosis with toxic multinodular goiter without thyrotoxic crisis or storm: Secondary | ICD-10-CM | POA: Insufficient documentation

## 2024-06-27 DIAGNOSIS — Z7901 Long term (current) use of anticoagulants: Secondary | ICD-10-CM | POA: Diagnosis not present

## 2024-06-27 DIAGNOSIS — Z79899 Other long term (current) drug therapy: Secondary | ICD-10-CM | POA: Diagnosis not present

## 2024-06-27 DIAGNOSIS — E0590 Thyrotoxicosis, unspecified without thyrotoxic crisis or storm: Secondary | ICD-10-CM

## 2024-06-27 DIAGNOSIS — Z01810 Encounter for preprocedural cardiovascular examination: Secondary | ICD-10-CM | POA: Diagnosis not present

## 2024-06-27 LAB — COMPREHENSIVE METABOLIC PANEL WITH GFR
ALT: 17 U/L (ref 0–44)
AST: 19 U/L (ref 15–41)
Albumin: 3.5 g/dL (ref 3.5–5.0)
Alkaline Phosphatase: 56 U/L (ref 38–126)
Anion gap: 14 (ref 5–15)
BUN: 19 mg/dL (ref 8–23)
CO2: 24 mmol/L (ref 22–32)
Calcium: 9.6 mg/dL (ref 8.9–10.3)
Chloride: 97 mmol/L — ABNORMAL LOW (ref 98–111)
Creatinine, Ser: 4.55 mg/dL — ABNORMAL HIGH (ref 0.44–1.00)
GFR, Estimated: 10 mL/min — ABNORMAL LOW (ref >60)
Glucose, Bld: 98 mg/dL (ref 70–99)
Potassium: 4.6 mmol/L (ref 3.5–5.1)
Sodium: 135 mmol/L (ref 135–145)
Total Bilirubin: 4.5 mg/dL — ABNORMAL HIGH (ref 0.0–1.2)
Total Protein: 6.7 g/dL (ref 6.5–8.1)

## 2024-06-27 LAB — T4, FREE: Free T4: 3.06 ng/dL — ABNORMAL HIGH (ref 0.61–1.12)

## 2024-06-27 LAB — TSH: TSH: 0.075 u[IU]/mL — ABNORMAL LOW (ref 0.350–4.500)

## 2024-06-27 LAB — BRAIN NATRIURETIC PEPTIDE: B Natriuretic Peptide: 3465.1 pg/mL — ABNORMAL HIGH (ref 0.0–100.0)

## 2024-06-27 MED ORDER — AMIODARONE HCL 200 MG PO TABS: 200.0000 mg | ORAL_TABLET | Freq: Every day | ORAL | 3 refills | Status: DC

## 2024-06-27 NOTE — Patient Instructions (Signed)
 There has been no changes to your medications.  Labs done today, your results will be available in MyChart, we will contact you for abnormal readings.  Your physician recommends that you schedule a follow-up appointment in: 3 months ( October) ** PLEASE CALL THE OFFICE IN 3 WEEKS TO ARRANGE YOUR FOLLOW UP APPOINTMENT.**  If you have any questions or concerns before your next appointment please send us  a message through Louise or call our office at [phone].    TO LEAVE A MESSAGE FOR THE NURSE SELECT OPTION 2, PLEASE LEAVE A MESSAGE INCLUDING: YOUR NAME DATE OF BIRTH CALL BACK NUMBER REASON FOR CALL**this is important as we prioritize the call backs  YOU WILL RECEIVE A CALL BACK THE SAME DAY AS LONG AS YOU CALL BEFORE 4:00 PM  At the [facility], you and your health needs are our priority. As part of our continuing mission to provide you with exceptional heart care, we have created designated Provider Care Teams. These Care Teams include your primary Cardiologist (physician) and Advanced Practice Providers (APPs- Physician Assistants and Nurse Practitioners) who all work together to provide you with the care you need, when you need it.   You may see any of the following providers on your designated Care Team at your next follow up: Dr Toribio Fuel Dr Ezra Shuck Dr. Ria Commander Dr. Morene Brownie Amy Lenetta, NP Caffie Shed, [place] [facility], [place] Beckey Coe, NP [place] Lee, NP Ellouise Class, NP Tinnie Redman, PharmD Jaun Bash, PharmD   Please be sure to bring in all your medications bottles to every appointment.    Thank you for choosing [facility]

## 2024-06-27 NOTE — Progress Notes (Addendum)
 PCP:  Alvera Reagin, PA  HF Cardiologist: Dr. Rolan  Chief complaint: CHF   HPI: Vanessa [name] is a 73 y.o. with history of nonischemic cardiomyopathy and presents for followup of CHF. Patient has had a low EF recognized since at least 9/14, when echo showed EF 40-45%.  In Jun 29, 2024, her child died, which led to significant stress.  She developed worsening exertional dyspnea and subsequent echo showed a fall in EF to 25-30%.  She had left and right heart catheterization in 3/16 with no significant coronary disease detected and moderately elevated right and left heart filling pressures along with pulmonary venous hypertension.  Echo was done 12/16: EF 45%, diffuse hypokinesis. She had a repeat echo 11/17 => EF 45% with moderately dilated LV, moderate MR.  Echo 11/18 showed EF stable at 45% with mild LV dilation and mildly decreased RV systolic function, moderate MR.   Spironolactone  was stopped due to elevated K. She apparently did not tolerate V[place]  well.   Echo was done in 11/19.  EF 30-35% with mild LV hypertrophy, mild-moderately decreased RV systolic function, possible severe MR with restricted posterior leaflet, PASP 50 mmHg, severe LAE.  TEE was done in 11/19 to assess mitral regurgitation.  This showed severe MR with restricted posterior leaflet.   In 1/20, she had Mitraclip with reduction in MR to 1+.    She had echo in 9/20 with EF 30-35%, severe LAE, normal RV, s/p Mitraclip with mild-moderate MR and mild MS.  The IVC was dilated, suggesting volume overload.   She was unable to tolerate Bidil due to lightheadedness.   Echo in 1/21 showed EF 40-45%, RV normal, biatrial dilation, mild mitral stenosis mean 5 mmHg, mild-moderate MR (s/p Mitraclip).  Echo in 1/22 showed EF 40-45% with moderate LV dilation, normal RV size and systolic function, severe LAE, s/p Mitraclip with probably mild mitral stenosis (mean gradient 7, MVA 2.2 cm^2 by PHT), moderate MR.   Echo in 3/23  showed EF 40-45%, moderate LV dilation, normal RV, normal IVC, s/p MV repair with Mitraclip with mild mitral stenosis mean gradient 7 mmHg, moderate MR.   Echo (9/23) showed EF stable 40-45%, mild MS (mean gradient 7 mmHg) and mild to moderate MR s/p Mitraclip.   Echo in 9/24 showed EF 40-45%, mild LV dilation and mild LVH, mildly decreased RV systolic function, PASP 47 mmHg, s/p Mitraclip with mean gradient 6 mmHg, suspect severe mitral regurgitation, IVC normal, small secundum ASD.  TEE in 10/24 showed EF 40-45%, mild LV dilation, moderate RV dysfunction with mild RV enlargement, severe LAE, 2 Mitraclips in place with mean gradient 4 mmHg and severe eccentric MR from between the 2 clips with ERO 0.54 cm^2 and peak RV-RA gradient 63 mmHg.  I reviewed the TEE with structural heart colleagues, and there does not appear to be room between the 2 existing Mitraclips to place a 3rd clip.  R/LHC arranged (11/24) as part of pre-surgical evaluation for MVR, which showed no significant CAD, normal RA pressure with mildly elevated PCWP, preserved CO and PAPi, and moderate mixed pulmonary arterial and pulmonary venous hypertension, likely due to mitral valve regurgitation.  She had bioprosthetic mitral valve replacement + tricuspid valve repair on 11/29/23.  She developed post-op surgical site bleeding with tamponade and was taken back to the OR the next day.  She had a very difficult course after this with post-op shock, then AKI with CVVH.  She was eventually transitioned to [facility].  She  went to CIR and now is home. Echo (3/25) showed EF 35-40%, moderate RV dysfunction with moderate RV enlargement, PASP 50 mmHg, bioprosthetic mitral valve with mean gradient up to 13 mmHg with appearance of a fixed posterior leaflet (?severe stenosis), s/p TV repair with mild-moderate TR, mild AS with mean gradient 9 mmHg.  She has been back to the ER several times with hypotension with dialysis and is now on midodrine  20 mg tid +  Florinef .   Echo 07/25: EF 30-35%, RV mildly reduced, RVSP 64 mmHg, mean bioprosthetic mitral valve with gradient 6.5 mmHg, TV repair with mild to moderate TR, mild AS with mean gradient 8 mmHg, dilated IVC  Here today for CHF follow-up. Her strength has significantly improved over the last few months. She is now ambulating with a walker or a cane. She can walk shorter distances in the store. Still needs some assistance with climbing stairs. Notes a little bit of lower extremity edema that comes and goes and has some shortness of breath with exertion.  She notes that volume removal has been limited by blood pressure. When she first started HD she passed out a couple of times but states this has not happened recently. She is down 11 lb from last visit.      PMH: 1. Cardiomyopathy: Nonischemic.  Echo (9/14) with EF 40-45%.  Echo (2/16) with EF 25-30%.  Echo (4/16) with EF 30-35%, moderate MR.  Echo (6/16) with EF 40%, moderate LV dilation, moderate diastolic dysfunction, moderate MR, moderate TR, PA systolic pressure 52 mmHg. RHC/LHC (3/16) with no significant CAD, EF 20-25%, 3+ MR; mean RA 13, PA 70/30 mean 45, mean PCWP 31 mmHg, CI 2.2, PVR 3.1 WU.  Echo (12/16) with EF 45%, diffuse hypokinesis, moderate MR, normal RV size and systolic function.  - Echo (11/17): EF 45%, moderately dilated LV, moderate eccentric posterior MR, PASP 42 mmHg, normal RV size and systolic function.  - Echo (11/18): EF 45%, mild LV dilation, moderate diastolic dysfunction, normal RV size with mildly decreased systolic function, PASP 50 mmHg, moderate MR.  - Echo (11/19): EF 30-35%, mild LV dilation, severe LAE, mild-moderately decreased RV systolic function, possible severe MR with restricted posterior leaflet, PASP 50 mmHg.  - Echo (1/20): EF 30-35%, severe LV dilation, s/p Mitraclip with mild MR and mild MS, PASP 48 mmHg.  - RHC (1/20, with Mitraclip): mean RA 10, mean LA 18 - Echo (2/20): EF 30-35%, s/p mitraclip with  mild mitral stenosis, mean MV gradient 5 mmHg, moderate MR, PASP 56 mmHg.  - Echo (9/20): EF 30-35%, severe LAE, normal RV, s/p Mitraclip with mild-moderate MR and mild MS.  The IVC was dilated, suggesting volume overload.  - Echo (1/21): EF 40-45%, RV normal, biatrial dilation, mild mitral stenosis mean 5 mmHg, mild-moderate MR (s/p Mitraclip).  - Echo (1/22): EF 40-45% with moderate LV dilation, normal RV size and systolic function, severe LAE, s/p Mitraclip with probably mild mitral stenosis (mean gradient 7, MVA 2.2 cm^2 by PHT), moderate MR. - Echo (3/23): EF 40-45%, moderate LV dilation, normal RV, normal IVC, s/p MV repair with Mitraclip with mild mitral stenosis mean gradient 7 mmHg, moderate MR.  - Echo (9/23): EF 40-45%, grade I DD, normal RV, s/p MC repair with Mitraclip with mild mitral stenosis mean gradient 7 mmHg, moderate MR. - Echo (9/24): EF 40-45%, mild LV dilation and mild LVH, mildly decreased RV systolic function, PASP 47 mmHg, s/p Mitraclip with mean gradient 6 mmHg, suspect severe mitral regurgitation, IVC normal,  small secundum ASD.  - TEE (10/24): EF 40-45%, mild LV dilation, moderate RV dysfunction with mild RV enlargement, severe LAE, 2 Mitraclips in place with mean gradient 4 mmHg and severe eccentric MR from between the 2 clips with ERO 0.54 cm^2 and peak RV-RA gradient 63 mmHg. - R/LHC (11/24): no significant CAD; RA 5, PA 61/19 (39), PCWP mean 18, CO/CI (Fick) 4.67/2.52, PVR 4.5 WU - Echo (3/25): EF 35-40%, moderate RV dysfunction with moderate RV enlargement, PASP 50 mmHg, bioprosthetic mitral valve with mean gradient up to 13 mmHg with appearance of a fixed posterior leaflet (?severe stenosis), s/p TV repair with mild-moderate TR, mild AS with mean gradient 9 mmHg.  - Echo 07/25: EF 30-35%, RV mildly reduced, RVSP 64 mmHg, mean bioprosthetic mitral valve with gradient 6.5 mmHg, TV repair with mild to moderate TR, mild AS with mean gradient 8 mmHg, dilated IVC 2. PVCs:  Holter (10/15) with 12% PVCs. 3. Pulmonary venous hypertension: CTA chest (7/16) negative for PE. PFTs (7/16) with minimal obstruction, mild restriction, moderately decreased DLCO.  4. Thyroid  nodules: Biopsy benign.  5. ESRD: Post-op MVR.   6. Mitral regurgitation: TEE (11/19) with EF 30-35%, moderate LV dilation, severe MR with posterior leaflet restriction and ERO 0.41 cm^2, no mitral stenosis, mild to moderately decreased RV systolic function, peak RV-RA gradient 50 mmHg.  - Mitraclip placement x 2 in 1/20 with 1+ residual MR.  - Echo (2/20) with moderate residual MR, mild MS with mean gradient 5 mmHg.  - Echo (9/20) with mild-moderate MR, mild MS.  - Echo (1/21) with mild-moderate MR, mild MS s/p Mitraclip - Echo (1/22) with moderate residual MR, mild-moderate mitral stenosis.  - Echo (3/23) with moderate MR, mild MS s/p Mitraclip.  - Echo (9/23) with mild-moderate MR, mild MS mean gradient 7 mmHg - Echo (9/24) with severe MR, mild MS mean gradient 6 mmHg post-Mitraclip.  - TEE (10/24): with 2 Mitraclips in place with mean gradient 4 mmHg and severe eccentric MR from between the 2 clips with ERO 0.54 cm^2. - Bioprosthetic mitral valve replacement + tricuspid valve repair on 11/29/23. 7. Hyperthyroism: Toxic multinodular goiter.  8. Atrial fibrillation: Paroxysmal.   SH: Lives alone, only child passed away in 2024/07/04, nonsmoker, no ETOH or drugs.   FH: Father with MIs  ROS: All systems reviewed and negative except as per HPI.   Current Outpatient Medications  Medication Sig Dispense Refill   ascorbic acid  (VITAMIN C ) 500 MG tablet Take 0.5 tablets (250 mg total) by mouth 2 (two) times daily. 30 tablet 0   atorvastatin  (LIPITOR) 10 MG tablet Take 1 tablet (10 mg total) by mouth daily. 90 tablet 3   docusate sodium  (COLACE) 100 MG capsule Take 100 mg by mouth as needed for mild constipation.     midodrine  (PROAMATINE ) 10 MG tablet Take 2 tablets (20 mg total) by mouth 3 (three) times  daily with meals. 350 tablet 3   polyethylene glycol powder (GLYCOLAX /MIRALAX ) 17 GM/SCOOP powder Take 17 g by mouth daily as needed. 476 g 0   torsemide  (DEMADEX ) 100 MG tablet Take 1 tablet (100 mg total) by mouth every Tuesday, Thursday, and Saturday at 6 PM. 20 tablet 1   warfarin (COUMADIN ) 5 MG tablet TAKE 1/2 TABLET TO 1 TABLET BY MOUTH DAILY AS DIRECTED BY COUMADIN  CLINIC 30 tablet 1   amiodarone  (PACERONE ) 200 MG tablet Take 1 tablet (200 mg total) by mouth daily. 90 tablet 3   No current facility-administered medications for this encounter.  BP 122/70   Pulse 75   Ht 5' 3 (1.6 m)   Wt 67.7 kg (149 lb 3.2 oz)   SpO2 96%   BMI 26.43 kg/m    Wt Readings from Last 3 Encounters:  06/27/24 67.7 kg (149 lb 3.2 oz)  04/20/24 73.2 kg (161 lb 6.4 oz)  02/18/24 68.2 kg (150 lb 5.7 oz)  General: NAD Neck: JVP 12-14 cm, no thyromegaly or thyroid  nodule.  Lungs: Distant BS CV: Nondisplaced PMI.  Heart regular S1/S2, no S3/S4, no murmur.  1+ edema to knees.  No carotid bruit.  Difficult to palpate pedal pulses.  Abdomen: Soft, nontender, no hepatosplenomegaly, no distention.  Skin: Intact without lesions or rashes.  Neurologic: Alert and oriented x 3.  Psych: Normal affect. Extremities: No clubbing or cyanosis.  HEENT: Normal.   Assessment/Plan: 1. Chronic systolic CHF: Long history of nonischemic cardiomyopathy and mitral regurgitation.  Bioprosthetic MVR and tricuspid ring 11/29/23 with Dr. Maryjane. Pre-op cath with no significant CAD.  Pre-op echo with EF 40-45%, severe MR. Post-op shock/AKI, went back to the OR with post-op tamponade for clean-out.  Required CVVH then iHD. Post-op echo 3/25 with EF 35-40%, moderate RV dysfunction with moderate RV enlargement, PASP 50 mmHg, bioprosthetic mitral valve with mean gradient up to 13 mmHg with appearance of a fixed posterior leaflet (?severe stenosis), s/p TV repair with mild-moderate TR, mild AS with mean gradient 9 mmHg.  Repeat echo  07/25 EF 30-35%, RV mildly reduced, RVSP 64 mmHg, bioprosthetic mitral valve with lower mean gradient at 6.5 mmHg, TV repair with mild to moderate TR, mild AS with mean gradient 8 mmHg, dilated IVC. - Patient is now at home after prolonged hospitalization and rehab stay. Strength has improved tremendously.  Volume managed with CVVH and takes 100 mg Torsemide  on non-HD days. Appears volume overloaded on exam and by ReDS which is 40%.  - Continues to require midodrine  20 mg TID. Needs more volume off at HD. Discussed with her Nephrologist, Dr. Tobie, will see if she can tolerate more volume removal during HD sessions. Volume removal has previously been limited by hypotension. - Continue midodrine  20 mg tid. BP stable today. - Remains off fludrocortisone  - Labs today 2. Mitral regurgitation:  TEE in 11/19 with severe MR, restricted posterior leaflet.  She had Mitraclip x 2 in 1/20. Post-op echo showed mild MR and mild mitral stenosis.   Echo in 9/24 showed MV s/p Mitraclip x 2 with mean gradient 6 mmHg and severe mitral regurgitation. TEE was then done to evaluate, showing 2 Mitraclips in place with mean gradient 4 mmHg and severe eccentric MR from between the 2 clips with ERO 0.54 cm^2.  She was not thought to be a candidate for an additional Mitraclip due to lack of room between the two existing clips. R/LHC (11/24) showed moderate mixed pulmonary arterial and pulmonary venous hypertension likely due to mitral valve regurgitation.  S/p MV replacement and tricuspid repair on 11/29/23 with Dr. Maryjane. Echo in 3/25 showed bioprosthetic mitral valve with mean gradient up to 13 mmHg with appearance of a fixed posterior leaflet (?severe stenosis).  She would not be a candidate for redo surgery and is not interested in invasive procedure such as TMVR. Repeat echo 07/25 EF 30-35%, RV mildly reduced, RVSP 64 mmHg, bioprosthetic mitral valve with lower mean gradient at 6.5 mmHg, TV repair with mild to moderate TR, mild  AS with mean gradient 8 mmHg, dilated IVC. 3. PVCs:  - Continue amiodarone  200 mg  daily, check LFTs, TSH, and Free T4 today and will need regular eye exam while on amiodarone .  4. Pulmonary hypertension: Pre-op RHC (11/24) with PVR 4.5, with moderate mixed pulmonary arterial and pulmonary venous hypertension likely due to mitral valve regurgitation. s/p MVR, PA pressure severely elevated post-op.  Suspect still mixed PAH/PVH with elevated PCWP.  Will hold off on pulmonary vasodilators; small clinical trials demonstrate no improvement and some markers of harm with PDE5i in PH in the setting of mitral valve disease. RHC 12/17/23 with mild pulmonary arterial hypertension.  5. Toxic multinodular goiter: She is now off methimazole . Follows with Endocrinology - TSH today 6. ESRD: Developed post-op MVR.  She has struggled with HD due to RV failure and hypotension.  Currently requiring midodrine  20 mg tid.  - Continue midodrine .  - Need to try to get more fluid off as above - Patient reports Nephrology team is planning fistula creation 7. Atrial fibrillation: Paroxysmal. Regular on exam today - Continue po amiodarone  - Continue warfarin.  She had a very difficult course post-MVR. She has recovered quite well after prolonged hospitalization and rehab stay but remains HD dependent.  She does not want future invasive procedures. She reiterated this again today.  Handicap placard filled out for her today.  Followup 3 months with Dr. Rolan  I spent 35 minutes reviewing records, interviewing/examining patient, and managing orders.    Bonney COLLETTA MANUELITA LOISE, PA-C  06/27/2024

## 2024-06-27 NOTE — Progress Notes (Signed)
 ReDS Vest / Clip - 06/27/24 0900       ReDS Vest / Clip   Station Marker A    Ruler Value 32    ReDS Value Range Moderate volume overload    ReDS Actual Value 40

## 2024-06-28 LAB — T3, FREE: T3, Free: 2.2 pg/mL (ref 2.0–4.4)

## 2024-06-28 MED ORDER — METHIMAZOLE 5 MG PO TABS: 5.0000 mg | ORAL_TABLET | Freq: Every day | ORAL | 1 refills | Status: DC

## 2024-06-28 NOTE — Telephone Encounter (Signed)
 Spoke with patient and advised since she is starting methimazole  5mg  once daily that we will need to monitor her INR closely. She states that will be fine and to set up the appointment on Tuesday or Thursday-non dialysis days. Advised appointment set for 8/7 at 130pm and she was thankful for the call.

## 2024-06-28 NOTE — Telephone Encounter (Signed)
-----   Message from Nurse Andriette B sent at 06/28/2024 10:09 AM EDT ----- Regarding: medication start Hi, this patient is re starting methimazole  5mg  once daily    Just wanted to make you aware.   Thank you!!  Jenna B. RN

## 2024-07-06 ENCOUNTER — Ambulatory Visit: Attending: Cardiology | Admitting: *Deleted

## 2024-07-06 DIAGNOSIS — Z5181 Encounter for therapeutic drug level monitoring: Secondary | ICD-10-CM

## 2024-07-06 DIAGNOSIS — Z952 Presence of prosthetic heart valve: Secondary | ICD-10-CM

## 2024-07-06 LAB — POCT INR: POC INR: 4.2

## 2024-07-06 NOTE — Patient Instructions (Signed)
 Description   Hold warfarin tomorrow Then START taking warfarin 1/2 a tablet daily except for 1 tablet on Sunday and Thursdays.  Stay consistent with greens (1 per week)   Recheck INR in 1 week.  Coumadin  Clinic [phone]  Dialysis M-W-F, Amiodarone  200 mg daily

## 2024-07-06 NOTE — Progress Notes (Signed)
 INR 4.2,  Please see anticoagulation encounter

## 2024-07-07 ENCOUNTER — Encounter: Admitting: Surgery

## 2024-07-07 ENCOUNTER — Encounter (HOSPITAL_COMMUNITY)

## 2024-07-10 NOTE — Progress Notes (Signed)
 REFERRAL PLACED

## 2024-07-11 ENCOUNTER — Other Ambulatory Visit: Payer: Self-pay | Admitting: Physician Assistant

## 2024-07-11 DIAGNOSIS — N6489 Other specified disorders of breast: Secondary | ICD-10-CM

## 2024-07-13 ENCOUNTER — Encounter: Payer: Self-pay | Admitting: Vascular Surgery

## 2024-07-13 ENCOUNTER — Ambulatory Visit (HOSPITAL_COMMUNITY)
Admission: RE | Admit: 2024-07-13 | Discharge: 2024-07-13 | Disposition: A | Discharge status: Home / Self Care | Source: Ambulatory Visit | Attending: Vascular Surgery | Admitting: Vascular Surgery

## 2024-07-13 ENCOUNTER — Ambulatory Visit: Admitting: Vascular Surgery

## 2024-07-13 ENCOUNTER — Ambulatory Visit (HOSPITAL_BASED_OUTPATIENT_CLINIC_OR_DEPARTMENT_OTHER)
Admission: RE | Admit: 2024-07-13 | Discharge: 2024-07-13 | Disposition: A | Discharge status: Home / Self Care | Source: Ambulatory Visit | Attending: Vascular Surgery | Admitting: Vascular Surgery

## 2024-07-13 ENCOUNTER — Other Ambulatory Visit (HOSPITAL_COMMUNITY): Payer: Self-pay | Admitting: Vascular Surgery

## 2024-07-13 ENCOUNTER — Other Ambulatory Visit: Payer: Self-pay | Admitting: *Deleted

## 2024-07-13 ENCOUNTER — Ambulatory Visit
Admission: RE | Admit: 2024-07-13 | Discharge: 2024-07-13 | Disposition: A | Discharge status: Home / Self Care | Source: Ambulatory Visit | Attending: Vascular Surgery | Admitting: Vascular Surgery

## 2024-07-13 ENCOUNTER — Ambulatory Visit (INDEPENDENT_AMBULATORY_CARE_PROVIDER_SITE_OTHER): Admitting: *Deleted

## 2024-07-13 VITALS — BP 132/78 | HR 86 | Temp 98.2°F | Resp 16 | Ht 63.0 in | Wt 152.4 lb

## 2024-07-13 DIAGNOSIS — N186 End stage renal disease: Secondary | ICD-10-CM | POA: Diagnosis present

## 2024-07-13 DIAGNOSIS — E0590 Thyrotoxicosis, unspecified without thyrotoxic crisis or storm: Secondary | ICD-10-CM | POA: Diagnosis present

## 2024-07-13 DIAGNOSIS — Z952 Presence of prosthetic heart valve: Secondary | ICD-10-CM | POA: Insufficient documentation

## 2024-07-13 DIAGNOSIS — Z5181 Encounter for therapeutic drug level monitoring: Secondary | ICD-10-CM | POA: Insufficient documentation

## 2024-07-13 LAB — POCT INR: POC INR: 2.6

## 2024-07-13 LAB — TSH: TSH: 0.041 u[IU]/mL — ABNORMAL LOW (ref 0.350–4.500)

## 2024-07-13 LAB — T4, FREE: Free T4: 3.66 ng/dL — ABNORMAL HIGH (ref 0.61–1.12)

## 2024-07-13 NOTE — Progress Notes (Signed)
 INR 2.6; Please see anticoagulation encounter

## 2024-07-13 NOTE — Patient Instructions (Signed)
 Description   Continue taking warfarin 1/2 a tablet daily except for 1 tablet on Sunday and Thursdays.  Stay consistent with greens (1 per week)   Recheck INR in 3 weeks.  Please let us  know of any upcoming procedures.  Coumadin  Clinic [phone]  Dialysis M-W-F, Amiodarone  200 mg daily

## 2024-07-13 NOTE — Progress Notes (Signed)
 Office Note     CC:  ESRD Requesting Provider:  Marlee Bernardino NOVAK, MD  HPI: Vanessa [name] is a Right handed 73 y.o. ([birth year]) female with kidney disease who presents at the request of Marlee Bernardino NOVAK, MD for permanent HD access. The patient has had no prior access procedures. Per pt, previous tunneled lines have been placed in right neck. Current access is right internal jugular TDC. Dialysis days are MWF.   On exam, Vanessa [name] was doing well.  A native of [place] , she moved to [place] in 1975 with her husband.  She has worked several jobs, including with the [employer] system for a number of years.  Now retired, she continues to live independently.  She uses a walker to ambulate.  She was understandably upset about her kidney disease but has a healthy outlook. Denies surgery to the upper extremities.   Past Medical History:  Diagnosis Date   CHF (congestive heart failure) (HCC)    ESRD on hemodialysis (HCC)    Heart murmur    Hypertension    Moderate mitral regurgitation    moderate to severe MR with moderate pulmonary HTN   Multinodular goiter    Nonischemic cardiomyopathy (HCC)    EF 30-35% by echo 2015   Pulmonary HTN (HCC)    moderate with PASP by echo 2015   PVC's (premature ventricular contractions)     Past Surgical History:  Procedure Laterality Date   CARDIAC CATHETERIZATION  2003   normal coronary arteries   CARDIAC CATHETERIZATION  2013  Nov   h/o nonischemic DCM EF 35-40% increase LVEDP    EXPLORATION POST OPERATIVE OPEN HEART N/A 11/29/2023   Procedure: EXPLORATION POST OPERATIVE OPEN HEART;  Surgeon: Maryjane Mt, MD;  Location: [facility] OR;  Service: Open Heart Surgery;  Laterality: N/A;   HYSTEROSCOPY WITH D & C  12/02/2012   Procedure: DILATATION AND CURETTAGE /HYSTEROSCOPY;  Surgeon: Rexene PARAS. Rosalva, MD;  Location: [facility] ORS;  Service: Gynecology;  Laterality: N/A;  cervical polypectomy   IR FLUORO GUIDE CV LINE LEFT  12/24/2023   IR  FLUORO GUIDE CV LINE RIGHT  12/24/2023   IR REMOVAL TUN CV CATH W/O FL  01/20/2024   IR US  GUIDE VASC ACCESS LEFT  12/24/2023   IR US  GUIDE VASC ACCESS RIGHT  12/24/2023   LEFT AND RIGHT HEART CATHETERIZATION WITH CORONARY ANGIOGRAM N/A 02/15/2015   Procedure: LEFT AND RIGHT HEART CATHETERIZATION WITH CORONARY ANGIOGRAM;  Surgeon: Candyce GORMAN Reek, MD;  Location: [facility] CATH LAB;  Service: Cardiovascular;  Laterality: N/A;   MITRAL VALVE REPLACEMENT N/A 11/29/2023   Procedure: MITRAL VALVE (MV) REPLACEMENT USING MOSAIC 310CINCH TISSUE VALVE;  Surgeon: Maryjane Mt, MD;  Location: [facility] OR;  Service: Open Heart Surgery;  Laterality: N/A;   RIGHT HEART CATH N/A 12/17/2023   Procedure: RIGHT HEART CATH;  Surgeon: Rolan Ezra GORMAN, MD;  Location: [facility] INVASIVE CV LAB;  Service: Cardiovascular;  Laterality: N/A;   RIGHT/LEFT HEART CATH AND CORONARY ANGIOGRAPHY N/A 10/12/2023   Procedure: RIGHT/LEFT HEART CATH AND CORONARY ANGIOGRAPHY;  Surgeon: Rolan Ezra GORMAN, MD;  Location: [facility] INVASIVE CV LAB;  Service: Cardiovascular;  Laterality: N/A;   TEE WITHOUT CARDIOVERSION N/A 10/11/2018   Procedure: TRANSESOPHAGEAL ECHOCARDIOGRAM (TEE);  Surgeon: Rolan Ezra GORMAN, MD;  Location: [facility] ENDOSCOPY;  Service: Cardiovascular;  Laterality: N/A;   TEE WITHOUT CARDIOVERSION N/A 09/07/2023   Procedure: TRANSESOPHAGEAL ECHOCARDIOGRAM;  Surgeon: Rolan Ezra GORMAN, MD;  Location: [facility] INVASIVE CV LAB;  Service:  Cardiovascular;  Laterality: N/A;   TEE WITHOUT CARDIOVERSION N/A 11/29/2023   Procedure: TRANSESOPHAGEAL ECHOCARDIOGRAM;  Surgeon: Maryjane Mt, MD;  Location: [facility] OR;  Service: Open Heart Surgery;  Laterality: N/A;   TRANSCATHETER MITRAL EDGE TO EDGE REPAIR N/A 12/14/2018   Procedure: MITRAL VALVE REPAIR;  Surgeon: Wonda Sharper, MD;  Location: [facility] INVASIVE CV LAB;  Service: Cardiovascular;  Laterality: N/A;   TRICUSPID VALVE REPLACEMENT N/A 11/29/2023   Procedure: TRICUSPID VALVE REPAIR USING MC3 EDWARDS TRICUSPID  ANNULOPLASTY RING;  Surgeon: Maryjane Mt, MD;  Location: [facility] OR;  Service: Open Heart Surgery;  Laterality: N/A;    Social History   Socioeconomic History   Marital status: Single    Spouse name: Not on file   Number of children: Not on file   Years of education: Not on file   Highest education level: Not on file  Occupational History   Occupation: Retired    Comment: Textile, [employer]  Tobacco Use   Smoking status: Never   Smokeless tobacco: Never  Vaping Use   Vaping status: Never Used  Substance and Sexual Activity   Alcohol use: No    Alcohol/week: 0.0 standard drinks of alcohol   Drug use: No   Sexual activity: Not on file  Other Topics Concern   Not on file  Social History Narrative   Not on file   Social Drivers of Health   Financial Resource Strain: Not on file  Food Insecurity: No Food Insecurity (02/08/2024)   Hunger Vital Sign    Worried About Running Out of Food in the Last Year: Never true    Ran Out of Food in the Last Year: Never true  Transportation Needs: No Transportation Needs (02/08/2024)   PRAPARE - Administrator, Civil Service (Medical): No    Lack of Transportation (Non-Medical): No  Physical Activity: Not on file  Stress: Not on file  Social Connections: Socially Isolated (02/08/2024)   Social Connection and Isolation Panel    Frequency of Communication with Friends and Family: Twice a week    Frequency of Social Gatherings with Friends and Family: Never    Attends Religious Services: Never    Database administrator or Organizations: No    Attends Banker Meetings: Never    Marital Status: Separated  Intimate Partner Violence: Not At Risk (02/08/2024)   Humiliation, Afraid, Rape, and Kick questionnaire    Fear of Current or Ex-Partner: No    Emotionally Abused: No    Physically Abused: No    Sexually Abused: No   Family History  Problem Relation Age of Onset   Breast cancer Mother 77   Heart  disease Father    Breast cancer Sister 19   Colon cancer Neg Hx    Esophageal cancer Neg Hx    Rectal cancer Neg Hx    Stomach cancer Neg Hx    Thyroid  disease Neg Hx     Current Outpatient Medications  Medication Sig Dispense Refill   amiodarone  (PACERONE ) 200 MG tablet Take 1 tablet (200 mg total) by mouth daily. 90 tablet 3   ascorbic acid  (VITAMIN C ) 500 MG tablet Take 0.5 tablets (250 mg total) by mouth 2 (two) times daily. 30 tablet 0   atorvastatin  (LIPITOR) 10 MG tablet Take 1 tablet (10 mg total) by mouth daily. 90 tablet 3   docusate sodium  (COLACE) 100 MG capsule Take 100 mg by mouth as needed for mild constipation.  methimazole  (TAPAZOLE ) 5 MG tablet Take 1 tablet (5 mg total) by mouth daily. 30 tablet 1   midodrine  (PROAMATINE ) 10 MG tablet Take 2 tablets (20 mg total) by mouth 3 (three) times daily with meals. 350 tablet 3   polyethylene glycol powder (GLYCOLAX /MIRALAX ) 17 GM/SCOOP powder Take 17 g by mouth daily as needed. 476 g 0   torsemide  (DEMADEX ) 100 MG tablet Take 1 tablet (100 mg total) by mouth every Tuesday, Thursday, and Saturday at 6 PM. 20 tablet 1   warfarin (COUMADIN ) 5 MG tablet TAKE 1/2 TABLET TO 1 TABLET BY MOUTH DAILY AS DIRECTED BY COUMADIN  CLINIC 30 tablet 1   No current facility-administered medications for this visit.    Allergies  Allergen Reactions   Spironolactone      hyperkalemia     REVIEW OF SYSTEMS:  [X]  denotes positive finding, [ ]  denotes negative finding Cardiac  Comments:  Chest pain or chest pressure:    Shortness of breath upon exertion:    Short of breath when lying flat:    Irregular heart rhythm:        Vascular    Pain in calf, thigh, or hip brought on by ambulation:    Pain in feet at night that wakes you up from your sleep:     Blood clot in your veins:    Leg swelling:         Pulmonary    Oxygen at home:    Productive cough:     Wheezing:         Neurologic    Sudden weakness in arms or legs:     Sudden  numbness in arms or legs:     Sudden onset of difficulty speaking or slurred speech:    Temporary loss of vision in one eye:     Problems with dizziness:         Gastrointestinal    Blood in stool:     Vomited blood:         Genitourinary    Burning when urinating:     Blood in urine:        Psychiatric    Major depression:         Hematologic    Bleeding problems:    Problems with blood clotting too easily:        Skin    Rashes or ulcers:        Constitutional    Fever or chills:      PHYSICAL EXAMINATION:  Vitals:   07/13/24 0904  BP: 132/78  Pulse: 86  Resp: 16  Temp: 98.2 F (36.8 C)  TempSrc: Temporal  SpO2: 96%  Weight: 152 lb 6.4 oz (69.1 kg)  Height: 5' 3 (1.6 m)    General:  WDWN in NAD; vital signs documented above Gait: Not observed HENT: WNL, normocephalic Pulmonary: normal non-labored breathing , without Rales, rhonchi,  wheezing Cardiac: regular HR, Abdomen: soft, NT, no masses Skin: without rashes Vascular Exam/Pulses:  Right Left  Radial 2+ (normal) 2+ (normal)                       Extremities: without ischemic changes, without Gangrene , without cellulitis; without open wounds;  Musculoskeletal: no muscle wasting or atrophy  Neurologic: A&O X 3;  No focal weakness or paresthesias are detected Psychiatric:  The pt has Normal affect.   Non-Invasive Vascular Imaging:   See studies    ASSESSMENT/PLAN:  Vanessa [name]  is a 73 y.o. female who presents with end stage renal disease  Based on vein mapping and examination, patient does not have suitable superficial veins in the arms.  She will require left arm AV graft as she is a right-handed lady.. I had an extensive discussion with this patient in regards to the nature of access surgery, including risk, benefits, and alternatives.   The patient is aware that the risks of access surgery include but are not limited to: bleeding, infection, steal syndrome, nerve damage, ischemic  monomelic neuropathy, failure of access to mature, complications related to venous hypertension, and possible need for additional access procedures in the future.   The patient has agreed to proceed with the above procedure which will be scheduled at her convenience.  Fonda FORBES Rim, MD [facility] [phone]

## 2024-07-14 ENCOUNTER — Ambulatory Visit (HOSPITAL_COMMUNITY): Payer: Self-pay | Admitting: Internal Medicine

## 2024-07-24 ENCOUNTER — Other Ambulatory Visit: Payer: Self-pay

## 2024-07-24 ENCOUNTER — Encounter (HOSPITAL_COMMUNITY): Payer: Self-pay | Admitting: Vascular Surgery

## 2024-07-24 NOTE — Progress Notes (Signed)
 Anesthesia Chart Review:  73 year old female follows in the advanced heart failure clinic for history of nonischemic cardiomyopathy, HFrEF, pulmonary hypertension (mild by RHC 12/17/2023), moderate RV dysfunction, severe much regurgitation s/p bioprosthetic MVR 10/2023 (with concomitant tricuspid valve repair), hypotension on midodrine  20 mg tid, PVCs, paroxysmal atrial fibrillation on amiodarone  and warfarin. Bioprosthetic MVR and tricuspid ring 11/29/23 with Dr. Maryjane. Pre-op  cath with no significant CAD.  Pre-op  echo with EF 40-45%, severe MR. Post-op shock/AKI, went back to the OR with post-op tamponade for clean-out.  Required CVVH then iHD. Post-op echo 3/25 with EF 35-40%, moderate RV dysfunction with moderate RV enlargement, PASP 50 mmHg, bioprosthetic mitral valve with mean gradient up to 13 mmHg with appearance of a fixed posterior leaflet (?severe stenosis), s/p TV repair with mild-moderate TR, mild AS with mean gradient 9 mmHg.  Repeat echo 07/25 EF 30-35%, RV mildly reduced, RVSP 64 mmHg, bioprosthetic mitral valve with lower mean gradient at 6.5 mmHg, TV repair with mild to moderate TR, mild AS with mean gradient 8 mmHg, dilated IVC. Per cardiology notes, she would not be a candidate for redo surgery and is not interested in invasive procedure such as TMVR. Repeat echo 07/25 with EF 30-35%, RV mildly reduced, RVSP 64 mmHg, bioprosthetic mitral valve with lower mean gradient at 6.5 mmHg, TV repair with mild to moderate TR, mild AS with mean gradient 8 mmHg, dilated IVC.   She was last seen in follow-up in the advanced heart failure clinic by Manuelita Dutch, PA-C on 06/27/24.  Per note, she is now home after prolonged hospital and rehab stay and has had significant improvement in her strength.  Volume management has been difficult.  It is felt that she needs more volume removed at HD, but she has difficulty tolerating this due to hypotension.  She is currently maintained on midodrine  20 mg tid.  Her  nephrologist Dr. Tobie is working to optimize her dry weight.  No changes were otherwise made to management.  It was discussed that she would be having fistula placed.  History of toxic multinodular goiter. Previously followed by endocrinology but lost to followup. Thyroid  panel ordered by Manuelita Dutch, PA-C 06/27/24 showed TSH 0.075 and free T4 3.06  She was initiated on methimazole  5mg  daily and referred back to endocrinology for management of hyperthyroidism. Recheck labs 07/13/2024 with TSH 0.041 and free T4 3.66.  CT C/A/P 12/13/23 shows markedly enlarged left thyroid  gland with rightward tracheal deviation.   History of difficult airway. Per anesthesia airway note 11/29/23, Difficulty Due To: Difficulty was anticipated, Difficult Airway- due to reduced neck mobility and Difficult Airway- due to anterior larynx Future Recommendations: Recommend- induction with short-acting agent, and alternative techniques readily available Comments: Previous difficult airway noted; all equipment immediately available. DL x1 with Glide 3 with grade 2 view; blade removed and head repositioned; DL x2 with glide 3 grade 2 view; ETT passed through cords. +chest rise and fall, +EtCO2, +BBS.  Last dose Coumadin  07/21/2024.  Dialyzing via R TDC on MWF schedule.   She will need DOS labs and eval.   EKG 04/20/2024: Sinus rhythm with 1st degree A-V block. Rate 78. Non-specific intra-ventricular conduction block. T wave abnormality, consider inferior ischemia  TTE 06/22/24:  1. Left ventricular ejection fraction, by estimation, is 30 to 35%. The  left ventricle has moderately decreased function. The left ventricle  demonstrates global hypokinesis. The left ventricular internal cavity size  was mildly dilated. Left ventricular  diastolic function could not be evaluated.  2. Right ventricular systolic function is mildly reduced. The right  ventricular size is mildly enlarged. There is severely elevated pulmonary   artery systolic pressure. The estimated right ventricular systolic  pressure is 60.4 mmHg.   3. The mitral valve has been repaired/replaced. No evidence of mitral  valve regurgitation. Mild mitral stenosis. The mean mitral valve gradient  is 6.5 mmHg. There is a 21 mm Medtronic bioprosthetic valve present in the  mitral position. Procedure Date:  11/29/23.   4. The tricuspid valve is has been repaired/replaced. The tricuspid valve  is status post repair with an annuloplasty ring. Tricuspid valve  regurgitation is mild to moderate.   5. The aortic valve is tricuspid. Aortic valve regurgitation is trivial.  Mild aortic valve stenosis. Aortic valve mean gradient measures 8.0 mmHg.  Aortic valve Vmax measures 1.80 m/s. Although the mean AVG and Vmax are  c/w no AS, the DVI is 0.49 and the   SVI is low at 21. Suspect patient has paradoxical mild low flow low  gradient aortic stenosis.   6. The inferior vena cava is dilated in size with <50% respiratory  variability, suggesting right atrial pressure of 15 mmHg.      Lynwood Geofm RIGGERS [facility] Short Stay [facility]/Anesthesiology Phone [phone] 07/24/2024 4:14 PM

## 2024-07-24 NOTE — Progress Notes (Signed)
 SDW CALL  Patient was given pre-op  instructions over the phone. The opportunity was given for the patient to ask questions. No further questions asked. Patient verbalized understanding of instructions given.  Date & Arrival time- July 25, 2024 at 5:30 am  PCP - Alvera Reagin, [place] Cardiologist - Rolan Ezra RAMAN, MD  PPM/ICD - denies Device Orders - n/a Rep Notified - n/a  Chest x-ray - 02-14-24 EKG - 04-20-24 Stress Test -  ECHO - 06-22-24 Cardiac Cath - 12-17-23  Sleep Study - denies CPAP - n/a  DM -denies  Blood Thinner Instructions:warfarin (COUMADIN ) Last dose 07-21-24 Aspirin  Instructions: denies  ERAS Protcol - NPO  COVID TEST- n/a  Anesthesia review: Yes, hx of Htn, ESRD, CHF and difficult airway  Medication morning of procedure_ Albuterol , amiodrone, lipitor, tapazole , midodrine .  Patient denies shortness of breath, fever, cough and chest pain over the phone call   All instructions explained to the patient, with a verbal understanding of the material. Patient agrees to go over the instructions while at home for a better understanding.

## 2024-07-24 NOTE — Anesthesia Preprocedure Evaluation (Signed)
 Anesthesia Evaluation  Patient identified by MRN, date of birth, ID band Patient awake    Reviewed: Allergy & Precautions, NPO status , Patient's Chart, lab work & pertinent test results, reviewed documented beta blocker date and time   History of Anesthesia Complications Negative for: history of anesthetic complications  Airway Mallampati: II  TM Distance: >3 FB     Dental no notable dental hx.    Pulmonary shortness of breath   breath sounds clear to auscultation       Cardiovascular hypertension, pulmonary hypertension+CHF  + Valvular Problems/Murmurs  Rhythm:Regular  IMPRESSIONS     1. Left ventricular ejection fraction, by estimation, is 30 to 35%. The  left ventricle has moderately decreased function. The left ventricle  demonstrates global hypokinesis. The left ventricular internal cavity size  was mildly dilated. Left ventricular  diastolic function could not be evaluated.   2. Right ventricular systolic function is mildly reduced. The right  ventricular size is mildly enlarged. There is severely elevated pulmonary  artery systolic pressure. The estimated right ventricular systolic  pressure is 60.4 mmHg.   3. The mitral valve has been repaired/replaced. No evidence of mitral  valve regurgitation. Mild mitral stenosis. The mean mitral valve gradient  is 6.5 mmHg. There is a 21 mm Medtronic bioprosthetic valve present in the  mitral position. Procedure Date:  11/29/23.   4. The tricuspid valve is has been repaired/replaced. The tricuspid valve  is status post repair with an annuloplasty ring. Tricuspid valve  regurgitation is mild to moderate.   5. The aortic valve is tricuspid. Aortic valve regurgitation is trivial.  Mild aortic valve stenosis. Aortic valve mean gradient measures 8.0 mmHg.  Aortic valve Vmax measures 1.80 m/s. Although the mean AVG and Vmax are  c/w no AS, the DVI is 0.49 and the   SVI is low at 21.  Suspect patient has paradoxical mild low flow low  gradient aortic stenosis.   6. The inferior vena cava is dilated in size with <50% respiratory  variability, suggesting right atrial pressure of 15 mmHg.      Neuro/Psych neg Seizures PSYCHIATRIC DISORDERS  Depression       GI/Hepatic ,,,(+) neg Cirrhosis        Endo/Other   Hyperthyroidism   Renal/GU ESRF and DialysisRenal disease     Musculoskeletal   Abdominal   Peds  Hematology  (+) Blood dyscrasia, anemia   Anesthesia Other Findings   Reproductive/Obstetrics                              Anesthesia Physical Anesthesia Plan  ASA: 3  Anesthesia Plan: General   Post-op Pain Management:    Induction: Intravenous  PONV Risk Score and Plan: 2 and Ondansetron  and Dexamethasone   Airway Management Planned: LMA  Additional Equipment:   Intra-op Plan:   Post-operative Plan: Extubation in OR  Informed Consent: I have reviewed the patients History and Physical, chart, labs and discussed the procedure including the risks, benefits and alternatives for the proposed anesthesia with the patient or authorized representative who has indicated his/her understanding and acceptance.     Dental advisory given  Plan Discussed with: CRNA  Anesthesia Plan Comments: (PAT note by Lynwood Hope, PA-C: 73 year old female follows in the advanced heart failure clinic for history of nonischemic cardiomyopathy, HFrEF, pulmonary hypertension (mild by RHC 12/17/2023), moderate RV dysfunction, severe much regurgitation s/p bioprosthetic MVR 10/2023 (with concomitant tricuspid valve repair), hypotension  on midodrine  20 mg tid, PVCs, paroxysmal atrial fibrillation on amiodarone  and warfarin. Bioprosthetic MVR and tricuspid ring 11/29/23 with Dr. Maryjane. Pre-op  cath with no significant CAD.  Pre-op  echo with EF 40-45%, severe MR. Post-op shock/AKI, went back to the OR with post-op tamponade for clean-out.  Required  CVVH then iHD. Post-op echo 3/25 with EF 35-40%, moderate RV dysfunction with moderate RV enlargement, PASP 50 mmHg, bioprosthetic mitral valve with mean gradient up to 13 mmHg with appearance of a fixed posterior leaflet (?severe stenosis), s/p TV repair with mild-moderate TR, mild AS with mean gradient 9 mmHg.  Repeat echo 07/25 EF 30-35%, RV mildly reduced, RVSP 64 mmHg, bioprosthetic mitral valve with lower mean gradient at 6.5 mmHg, TV repair with mild to moderate TR, mild AS with mean gradient 8 mmHg, dilated IVC. Per cardiology notes, she would not be a candidate for redo surgery and is not interested in invasive procedure such as TMVR. Repeat echo 07/25 with EF 30-35%, RV mildly reduced, RVSP 64 mmHg, bioprosthetic mitral valve with lower mean gradient at 6.5 mmHg, TV repair with mild to moderate TR, mild AS with mean gradient 8 mmHg, dilated IVC.   She was last seen in follow-up in the advanced heart failure clinic by Manuelita Dutch, PA-C on 06/27/24.  Per note, she is now home after prolonged hospital and rehab stay and has had significant improvement in her strength.  Volume management has been difficult.  It is felt that she needs more volume removed at HD, but she has difficulty tolerating this due to hypotension.  She is currently maintained on midodrine  20 mg tid.  Her nephrologist Dr. Tobie is working to optimize her dry weight.  No changes were otherwise made to management.  It was discussed that she would be having fistula placed.  History of toxic multinodular goiter. Previously followed by endocrinology but lost to followup. Thyroid  panel ordered by Manuelita Dutch, PA-C 06/27/24 showed TSH 0.075 and free T4 3.06  She was initiated on methimazole  5mg  daily and referred back to endocrinology for management of hyperthyroidism. Recheck labs 07/13/2024 with TSH 0.041 and free T4 3.66.  CT C/A/P 12/13/23 shows markedly enlarged left thyroid  gland with rightward tracheal deviation.   History of  difficult airway. Per anesthesia airway note 11/29/23, Difficulty Due To: Difficulty was anticipated, Difficult Airway- due to reduced neck mobility and Difficult Airway- due to anterior larynx Future Recommendations: Recommend- induction with short-acting agent, and alternative techniques readily available Comments: Previous difficult airway noted; all equipment immediately available. DL x1 with Glide 3 with grade 2 view; blade removed and head repositioned; DL x2 with glide 3 grade 2 view; ETT passed through cords. +chest rise and fall, +EtCO2, +BBS.  Last dose Coumadin  07/21/2024.  Dialyzing via R TDC on MWF schedule.   She will need DOS labs and eval.   EKG 04/20/2024: Sinus rhythm with 1st degree A-V block. Rate 78. Non-specific intra-ventricular conduction block. T wave abnormality, consider inferior ischemia  TTE 06/22/24:  1. Left ventricular ejection fraction, by estimation, is 30 to 35%. The  left ventricle has moderately decreased function. The left ventricle  demonstrates global hypokinesis. The left ventricular internal cavity size  was mildly dilated. Left ventricular  diastolic function could not be evaluated.   2. Right ventricular systolic function is mildly reduced. The right  ventricular size is mildly enlarged. There is severely elevated pulmonary  artery systolic pressure. The estimated right ventricular systolic  pressure is 60.4 mmHg.   3. The mitral  valve has been repaired/replaced. No evidence of mitral  valve regurgitation. Mild mitral stenosis. The mean mitral valve gradient  is 6.5 mmHg. There is a 21 mm Medtronic bioprosthetic valve present in the  mitral position. Procedure Date:  11/29/23.   4. The tricuspid valve is has been repaired/replaced. The tricuspid valve  is status post repair with an annuloplasty ring. Tricuspid valve  regurgitation is mild to moderate.   5. The aortic valve is tricuspid. Aortic valve regurgitation is trivial.  Mild aortic valve  stenosis. Aortic valve mean gradient measures 8.0 mmHg.  Aortic valve Vmax measures 1.80 m/s. Although the mean AVG and Vmax are  c/w no AS, the DVI is 0.49 and the   SVI is low at 21. Suspect patient has paradoxical mild low flow low  gradient aortic stenosis.   6. The inferior vena cava is dilated in size with <50% respiratory  variability, suggesting right atrial pressure of 15 mmHg.    )         Anesthesia Quick Evaluation

## 2024-07-24 NOTE — Progress Notes (Signed)
 Referral received. Care Everywhere initiated. Records reviewed. Per echo on 06/22/24, pt is likely NAC. In basket sent to Dr. Jerrye Sieving to review. Dialysis initiated on 02/14/24. Echo completed on 06/22/24 History:     Patient has prior history of Echocardiogram examinations, most                  recent 02/09/2024. CHF, Pulmonary HTN, Arrythmias:Atrial                  Fibrillation; Risk Factors:Hypertension. T30 Tricuspid Ring                  implanted 11/29/23.                   Mitral Valve: 21 mm Medtronic bioprosthetic valve valve is                  present in the mitral position. Procedure Date: 11/29/23.   Sonographer:    Damien Senior RDCS  Referring Phys: [phone] Vanessa [name]   IMPRESSIONS     1. Left ventricular ejection fraction, by estimation, is 30 to 35%. The left ventricle has moderately decreased function. The left ventricle demonstrates global hypokinesis. The left ventricular internal cavity size was mildly dilated. Left ventricular  diastolic function could not be evaluated.   2. Right ventricular systolic function is mildly reduced. The right ventricular size is mildly enlarged. There is severely elevated pulmonary artery systolic pressure. The estimated right ventricular systolic pressure is 60.4 mmHg.   3. The mitral valve has been repaired/replaced. No evidence of mitral valve regurgitation. Mild mitral stenosis. The mean mitral valve gradient is 6.5 mmHg. There is a 21 mm Medtronic bioprosthetic valve present in the mitral position. Procedure Date:  11/29/23.   4. The tricuspid valve is has been repaired/replaced. The tricuspid valve is status post repair with an annuloplasty ring. Tricuspid valve regurgitation is mild to moderate.   5. The aortic valve is tricuspid. Aortic valve regurgitation is trivial. Mild aortic valve stenosis. Aortic valve mean gradient measures 8.0 mmHg. Aortic valve Vmax measures 1.80 m/s. Although the mean AVG and Vmax are c/w no AS, the DVI  is 0.49 and the   SVI is low at 21. Suspect patient has paradoxical mild low flow low gradient aortic stenosis.   6. The inferior vena cava is dilated in size with <50% respiratory variability, suggesting right atrial pressure of 15 mmHg.

## 2024-07-25 ENCOUNTER — Ambulatory Visit (HOSPITAL_COMMUNITY)
Admission: RE | Admit: 2024-07-25 | Discharge: 2024-07-25 | Disposition: A | Discharge status: Home / Self Care | Attending: Vascular Surgery | Admitting: Vascular Surgery

## 2024-07-25 ENCOUNTER — Ambulatory Visit (HOSPITAL_COMMUNITY): Payer: Self-pay | Admitting: Anesthesiology

## 2024-07-25 ENCOUNTER — Other Ambulatory Visit: Payer: Self-pay

## 2024-07-25 ENCOUNTER — Encounter (HOSPITAL_COMMUNITY)
Admission: RE | Disposition: A | Discharge status: Home / Self Care | Payer: Self-pay | Source: Home / Self Care | Attending: Vascular Surgery

## 2024-07-25 ENCOUNTER — Encounter (HOSPITAL_COMMUNITY): Payer: Self-pay | Admitting: Vascular Surgery

## 2024-07-25 ENCOUNTER — Other Ambulatory Visit (HOSPITAL_COMMUNITY): Payer: Self-pay

## 2024-07-25 ENCOUNTER — Ambulatory Visit (HOSPITAL_BASED_OUTPATIENT_CLINIC_OR_DEPARTMENT_OTHER): Payer: Self-pay | Admitting: Anesthesiology

## 2024-07-25 DIAGNOSIS — I5022 Chronic systolic (congestive) heart failure: Secondary | ICD-10-CM | POA: Insufficient documentation

## 2024-07-25 DIAGNOSIS — I480 Paroxysmal atrial fibrillation: Secondary | ICD-10-CM | POA: Diagnosis not present

## 2024-07-25 DIAGNOSIS — Z992 Dependence on renal dialysis: Secondary | ICD-10-CM | POA: Insufficient documentation

## 2024-07-25 DIAGNOSIS — N186 End stage renal disease: Secondary | ICD-10-CM | POA: Diagnosis present

## 2024-07-25 DIAGNOSIS — I350 Nonrheumatic aortic (valve) stenosis: Secondary | ICD-10-CM | POA: Insufficient documentation

## 2024-07-25 DIAGNOSIS — I959 Hypotension, unspecified: Secondary | ICD-10-CM | POA: Insufficient documentation

## 2024-07-25 DIAGNOSIS — E0590 Thyrotoxicosis, unspecified without thyrotoxic crisis or storm: Secondary | ICD-10-CM | POA: Insufficient documentation

## 2024-07-25 DIAGNOSIS — R011 Cardiac murmur, unspecified: Secondary | ICD-10-CM | POA: Insufficient documentation

## 2024-07-25 DIAGNOSIS — I5033 Acute on chronic diastolic (congestive) heart failure: Secondary | ICD-10-CM | POA: Diagnosis not present

## 2024-07-25 DIAGNOSIS — Z952 Presence of prosthetic heart valve: Secondary | ICD-10-CM | POA: Insufficient documentation

## 2024-07-25 DIAGNOSIS — I083 Combined rheumatic disorders of mitral, aortic and tricuspid valves: Secondary | ICD-10-CM | POA: Insufficient documentation

## 2024-07-25 DIAGNOSIS — Z7901 Long term (current) use of anticoagulants: Secondary | ICD-10-CM | POA: Insufficient documentation

## 2024-07-25 DIAGNOSIS — I132 Hypertensive heart and chronic kidney disease with heart failure and with stage 5 chronic kidney disease, or end stage renal disease: Secondary | ICD-10-CM | POA: Diagnosis not present

## 2024-07-25 DIAGNOSIS — I272 Pulmonary hypertension, unspecified: Secondary | ICD-10-CM | POA: Diagnosis not present

## 2024-07-25 DIAGNOSIS — I493 Ventricular premature depolarization: Secondary | ICD-10-CM | POA: Insufficient documentation

## 2024-07-25 DIAGNOSIS — Z79899 Other long term (current) drug therapy: Secondary | ICD-10-CM | POA: Insufficient documentation

## 2024-07-25 HISTORY — PX: INSERTION OF ARTERIOVENOUS (AV) ARTEGRAFT ARM: SHX6779

## 2024-07-25 HISTORY — DX: Dyspnea, unspecified: R06.00

## 2024-07-25 LAB — POCT I-STAT, CHEM 8
BUN: 15 mg/dL (ref 8–23)
Calcium, Ion: 1.11 mmol/L — ABNORMAL LOW (ref 1.15–1.40)
Chloride: 98 mmol/L (ref 98–111)
Creatinine, Ser: 4.3 mg/dL — ABNORMAL HIGH (ref 0.44–1.00)
Glucose, Bld: 97 mg/dL (ref 70–99)
HCT: 34 % — ABNORMAL LOW (ref 36.0–46.0)
Hemoglobin: 11.6 g/dL — ABNORMAL LOW (ref 12.0–15.0)
Potassium: 4 mmol/L (ref 3.5–5.1)
Sodium: 139 mmol/L (ref 135–145)
TCO2: 29 mmol/L (ref 22–32)

## 2024-07-25 LAB — PROTIME-INR
INR: 1.4 — ABNORMAL HIGH (ref 0.8–1.2)
Prothrombin Time: 17.6 s — ABNORMAL HIGH (ref 11.4–15.2)

## 2024-07-25 LAB — APTT: aPTT: 27 s (ref 24–36)

## 2024-07-25 SURGERY — INSERTION, GRAFT, ARTERIOVENOUS, UPPER EXTREMITY
Anesthesia: General | Site: Arm Upper | Laterality: Left

## 2024-07-25 MED ORDER — ONDANSETRON HCL 4 MG/2ML IJ SOLN
INTRAMUSCULAR | Status: AC
  Filled 2024-07-25: qty 2

## 2024-07-25 MED ORDER — 0.9 % SODIUM CHLORIDE (POUR BTL) OPTIME
TOPICAL | Status: DC | PRN
  Administered 2024-07-25: 1000 mL

## 2024-07-25 MED ORDER — CHLORHEXIDINE GLUCONATE 4 % EX SOLN: 60.0000 mL | Freq: Once | CUTANEOUS | Status: DC

## 2024-07-25 MED ORDER — PROPOFOL 10 MG/ML IV BOLUS
INTRAVENOUS | Status: DC | PRN
  Administered 2024-07-25: 30 mg via INTRAVENOUS
  Administered 2024-07-25: 50 mg via INTRAVENOUS

## 2024-07-25 MED ORDER — DEXAMETHASONE SODIUM PHOSPHATE 10 MG/ML IJ SOLN
INTRAMUSCULAR | Status: AC
  Filled 2024-07-25: qty 1

## 2024-07-25 MED ORDER — FENTANYL CITRATE (PF) 100 MCG/2ML IJ SOLN
INTRAMUSCULAR | Status: DC | PRN
  Administered 2024-07-25: 50 ug via INTRAVENOUS
  Administered 2024-07-25 (×2): 25 ug via INTRAVENOUS

## 2024-07-25 MED ORDER — HEPARIN SODIUM (PORCINE) 1000 UNIT/ML IJ SOLN
INTRAMUSCULAR | Status: DC | PRN
  Administered 2024-07-25: 2000 [IU] via INTRAVENOUS

## 2024-07-25 MED ORDER — OXYCODONE HCL 5 MG PO TABS
5.0000 mg | ORAL_TABLET | Freq: Four times a day (QID) | ORAL | 0 refills | Status: DC | PRN
  Filled 2024-07-25: qty 6, 2d supply, fill #0

## 2024-07-25 MED ORDER — ONDANSETRON HCL 4 MG/2ML IJ SOLN
INTRAMUSCULAR | Status: DC | PRN
  Administered 2024-07-25: 4 mg via INTRAVENOUS

## 2024-07-25 MED ORDER — PROPOFOL 10 MG/ML IV BOLUS
INTRAVENOUS | Status: AC
  Filled 2024-07-25: qty 20

## 2024-07-25 MED ORDER — PHENYLEPHRINE HCL-NACL 20-0.9 MG/250ML-% IV SOLN
INTRAVENOUS | Status: DC | PRN
  Administered 2024-07-25: 80 ug via INTRAVENOUS
  Administered 2024-07-25: 25 ug/min via INTRAVENOUS

## 2024-07-25 MED ORDER — CEFAZOLIN SODIUM-DEXTROSE 2-4 GM/100ML-% IV SOLN
2.0000 g | INTRAVENOUS | Status: AC
  Administered 2024-07-25: 2 g via INTRAVENOUS
  Filled 2024-07-25: qty 100

## 2024-07-25 MED ORDER — LIDOCAINE-EPINEPHRINE 1 %-1:100000 IJ SOLN
INTRAMUSCULAR | Status: AC
  Filled 2024-07-25: qty 1

## 2024-07-25 MED ORDER — OXYCODONE HCL 5 MG/5ML PO SOLN: 5.0000 mg | Freq: Once | ORAL | Status: DC | PRN

## 2024-07-25 MED ORDER — ACETAMINOPHEN 10 MG/ML IV SOLN: 1000.0000 mg | Freq: Once | INTRAVENOUS | Status: DC | PRN

## 2024-07-25 MED ORDER — EPHEDRINE 5 MG/ML INJ
INTRAVENOUS | Status: AC
  Filled 2024-07-25: qty 5

## 2024-07-25 MED ORDER — FENTANYL CITRATE (PF) 100 MCG/2ML IJ SOLN
INTRAMUSCULAR | Status: AC
  Filled 2024-07-25: qty 2

## 2024-07-25 MED ORDER — DEXAMETHASONE SODIUM PHOSPHATE 10 MG/ML IJ SOLN
INTRAMUSCULAR | Status: DC | PRN
Start: 2024-07-25 — End: 2024-07-25
  Administered 2024-07-25: 10 mg via INTRAVENOUS

## 2024-07-25 MED ORDER — ORAL CARE MOUTH RINSE: 15.0000 mL | Freq: Once | OROMUCOSAL | Status: AC

## 2024-07-25 MED ORDER — PHENYLEPHRINE 80 MCG/ML (10ML) SYRINGE FOR IV PUSH (FOR BLOOD PRESSURE SUPPORT)
PREFILLED_SYRINGE | INTRAVENOUS | Status: AC
  Filled 2024-07-25: qty 10

## 2024-07-25 MED ORDER — SODIUM CHLORIDE 0.9 % IV SOLN: INTRAVENOUS | Status: DC

## 2024-07-25 MED ORDER — FENTANYL CITRATE (PF) 100 MCG/2ML IJ SOLN
25.0000 ug | INTRAMUSCULAR | Status: DC | PRN
  Administered 2024-07-25: 25 ug via INTRAVENOUS

## 2024-07-25 MED ORDER — WARFARIN SODIUM 5 MG PO TABS: 2.5000 mg | ORAL_TABLET | ORAL | Status: DC

## 2024-07-25 MED ORDER — HEPARIN 6000 UNIT IRRIGATION SOLUTION
Status: DC | PRN
  Administered 2024-07-25: 1

## 2024-07-25 MED ORDER — LIDOCAINE HCL (CARDIAC) PF 100 MG/5ML IV SOSY
PREFILLED_SYRINGE | INTRAVENOUS | Status: DC | PRN
  Administered 2024-07-25: 100 mg via INTRAVENOUS

## 2024-07-25 MED ORDER — FENTANYL CITRATE (PF) 100 MCG/2ML IJ SOLN
INTRAMUSCULAR | Status: AC
Start: 2024-07-25 — End: 2024-07-25
  Filled 2024-07-25: qty 2

## 2024-07-25 MED ORDER — MIDAZOLAM HCL 2 MG/2ML IJ SOLN
INTRAMUSCULAR | Status: AC
  Filled 2024-07-25: qty 2

## 2024-07-25 MED ORDER — CHLORHEXIDINE GLUCONATE 0.12 % MT SOLN
15.0000 mL | Freq: Once | OROMUCOSAL | Status: AC
  Administered 2024-07-25: 15 mL via OROMUCOSAL
  Filled 2024-07-25: qty 15

## 2024-07-25 MED ORDER — HEPARIN SODIUM (PORCINE) 1000 UNIT/ML IJ SOLN
INTRAMUSCULAR | Status: AC
Start: 2024-07-25 — End: 2024-07-25
  Filled 2024-07-25: qty 10

## 2024-07-25 MED ORDER — OXYCODONE HCL 5 MG PO TABS: 5.0000 mg | ORAL_TABLET | Freq: Once | ORAL | Status: DC | PRN

## 2024-07-25 MED ORDER — HEPARIN 6000 UNIT IRRIGATION SOLUTION
Status: AC
  Filled 2024-07-25: qty 500

## 2024-07-25 MED ORDER — ONDANSETRON HCL 4 MG/2ML IJ SOLN: 4.0000 mg | Freq: Once | INTRAMUSCULAR | Status: DC | PRN

## 2024-07-25 MED ORDER — EPHEDRINE SULFATE-NACL 50-0.9 MG/10ML-% IV SOSY
PREFILLED_SYRINGE | INTRAVENOUS | Status: DC | PRN
  Administered 2024-07-25: 5 mg via INTRAVENOUS

## 2024-07-25 MED ORDER — LIDOCAINE 2% (20 MG/ML) 5 ML SYRINGE
INTRAMUSCULAR | Status: AC
  Filled 2024-07-25: qty 5

## 2024-07-25 MED ORDER — MIDAZOLAM HCL 2 MG/2ML IJ SOLN
INTRAMUSCULAR | Status: DC | PRN
  Administered 2024-07-25: 1 mg via INTRAVENOUS

## 2024-07-25 SURGICAL SUPPLY — 25 items
ARMBAND PINK RESTRICT EXTREMIT (MISCELLANEOUS) ×2 IMPLANT
BAG COUNTER SPONGE SURGICOUNT (BAG) ×2 IMPLANT
BNDG ELASTIC 4X5.8 VLCR STR LF (GAUZE/BANDAGES/DRESSINGS) ×2 IMPLANT
BNDG STRETCH 4X75 NS LF (GAUZE/BANDAGES/DRESSINGS) IMPLANT
CANISTER SUCTION 3000ML PPV (SUCTIONS) ×2 IMPLANT
CLIP TI MEDIUM 6 (CLIP) ×2 IMPLANT
CLIP TI WIDE RED SMALL 6 (CLIP) ×2 IMPLANT
COVER PROBE W GEL 5X96 (DRAPES) ×2 IMPLANT
DERMABOND ADVANCED .7 DNX12 (GAUZE/BANDAGES/DRESSINGS) ×2 IMPLANT
ELECTRODE REM PT RTRN 9FT ADLT (ELECTROSURGICAL) ×2 IMPLANT
GLOVE BIOGEL PI IND STRL 8 (GLOVE) ×2 IMPLANT
GOWN STRL REUS W/ TWL LRG LVL3 (GOWN DISPOSABLE) ×4 IMPLANT
GOWN STRL REUS W/TWL 2XL LVL3 (GOWN DISPOSABLE) ×4 IMPLANT
KIT BASIN OR (CUSTOM PROCEDURE TRAY) ×2 IMPLANT
KIT TURNOVER KIT B (KITS) ×2 IMPLANT
NS IRRIG 1000ML POUR BTL (IV SOLUTION) ×2 IMPLANT
PACK CV ACCESS (CUSTOM PROCEDURE TRAY) ×2 IMPLANT
PAD ARMBOARD POSITIONER FOAM (MISCELLANEOUS) ×4 IMPLANT
SUT MNCRL AB 4-0 PS2 18 (SUTURE) ×2 IMPLANT
SUT PROLENE 6 0 BV (SUTURE) ×2 IMPLANT
SUT SILK 2 0 PERMA HAND 18 BK (SUTURE) IMPLANT
SUT VIC AB 3-0 SH 27X BRD (SUTURE) ×4 IMPLANT
TOWEL GREEN STERILE (TOWEL DISPOSABLE) ×2 IMPLANT
UNDERPAD 30X36 HEAVY ABSORB (UNDERPADS AND DIAPERS) ×2 IMPLANT
WATER STERILE IRR 1000ML POUR (IV SOLUTION) ×2 IMPLANT

## 2024-07-25 NOTE — Progress Notes (Signed)
 Dr. Lanis and Dr. Keneth made aware that the patient states she last took Coumadin  on 07/21/24. Dr. Lanis and Dr. Keneth made aware of today's APTT and PT-INR results. Ok to proceed per both physicians.

## 2024-07-25 NOTE — H&P (Addendum)
 Office Note   Patient seen and examined in preop holding.  No complaints. No changes to medication history or physical exam since last seen in clinic. After discussing the risks and benefits of left arm AV graft for permanent HD access, Vanessa [name] elected to proceed.  She is aware that with her heart failure, and continued struggle with hypotension, if this fails to work, she will be catheter dependent. She is also aware that with her heart failure and persistent hypotension, she is at higher risk of left upper extremity steal, monomelic neuropathy.  Vanessa FORBES Rim MD   CC:  ESRD Requesting Provider:  No ref. provider found  HPI: Vanessa [name] is a Right handed 73 y.o. (1951-05-14) female with kidney disease who presents at the request of No ref. provider found for permanent HD access. The patient has had no prior access procedures. Per pt, previous tunneled lines have been placed in right neck. Current access is right internal jugular TDC. Dialysis days are MWF.   On exam, Vanessa [name] was doing well.  A native of [place] , she moved to [place] in 1975 with her husband.  She has worked several jobs, including with the [employer] system for a number of years.  Now retired, she continues to live independently.  She uses a walker to ambulate.  She was understandably upset about her kidney disease but has a healthy outlook. Denies surgery to the upper extremities.   Past Medical History:  Diagnosis Date   CHF (congestive heart failure) (HCC)    Dyspnea    ESRD on hemodialysis (HCC)    Heart murmur    Hypertension    Moderate mitral regurgitation    moderate to severe MR with moderate pulmonary HTN   Multinodular goiter    Nonischemic cardiomyopathy (HCC)    EF 30-35% by echo 2015   Pulmonary HTN (HCC)    moderate with PASP by echo 2015   PVC's (premature ventricular contractions)     Past Surgical History:  Procedure Laterality Date   CARDIAC  CATHETERIZATION  2003   normal coronary arteries   CARDIAC CATHETERIZATION  2013  Nov   h/o nonischemic DCM EF 35-40% increase LVEDP    EXPLORATION POST OPERATIVE OPEN HEART N/A 11/29/2023   Procedure: EXPLORATION POST OPERATIVE OPEN HEART;  Surgeon: Maryjane Mt, MD;  Location: [facility] OR;  Service: Open Heart Surgery;  Laterality: N/A;   HYSTEROSCOPY WITH D & C  12/02/2012   Procedure: DILATATION AND CURETTAGE /HYSTEROSCOPY;  Surgeon: Rexene PARAS. Rosalva, MD;  Location: [facility] ORS;  Service: Gynecology;  Laterality: N/A;  cervical polypectomy   IR FLUORO GUIDE CV LINE LEFT  12/24/2023   IR FLUORO GUIDE CV LINE RIGHT  12/24/2023   IR REMOVAL TUN CV CATH W/O FL  01/20/2024   IR US  GUIDE VASC ACCESS LEFT  12/24/2023   IR US  GUIDE VASC ACCESS RIGHT  12/24/2023   LEFT AND RIGHT HEART CATHETERIZATION WITH CORONARY ANGIOGRAM N/A 02/15/2015   Procedure: LEFT AND RIGHT HEART CATHETERIZATION WITH CORONARY ANGIOGRAM;  Surgeon: Candyce GORMAN Reek, MD;  Location[facility] C[facility] LAB;  Service: Cardiovascular;  Laterality: N/A;   MITRAL VALVE REPLACEMENT N/A 11/29/2023   Procedure: MITRAL VALVE (MV) REPLACEMENT USING MOSAIC 310CINCH TISSUE VALVE;  Surgeon: Maryjane Mt, MD;  Location: [facility] OR;  Service: Open Heart Surgery;  Laterality: N/A;   RIGHT HEART CATH N/A 12/17/2023   Procedure: RIGHT HEART CATH;  Surgeon: Rolan Ezra GORMAN, MD;  Location: [facility]  INVASIVE CV LAB;  Service: Cardiovascular;  Laterality: N/A;   RIGHT/LEFT HEART CATH AND CORONARY ANGIOGRAPHY N/A 10/12/2023   Procedure: RIGHT/LEFT HEART CATH AND CORONARY ANGIOGRAPHY;  Surgeon: Rolan Ezra RAMAN, MD;  Location: [facility] INVASIVE CV LAB;  Service: Cardiovascular;  Laterality: N/A;   TEE WITHOUT CARDIOVERSION N/A 10/11/2018   Procedure: TRANSESOPHAGEAL ECHOCARDIOGRAM (TEE);  Surgeon: Rolan Ezra RAMAN, MD;  Location: [facility] ENDOSCOPY;  Service: Cardiovascular;  Laterality: N/A;   TEE WITHOUT CARDIOVERSION N/A 09/07/2023   Procedure: TRANSESOPHAGEAL ECHOCARDIOGRAM;  Surgeon: Rolan Ezra RAMAN, MD;  Location: [facility] INVASIVE CV LAB;  Service: Cardiovascular;  Laterality: N/A;   TEE WITHOUT CARDIOVERSION N/A 11/29/2023   Procedure: TRANSESOPHAGEAL ECHOCARDIOGRAM;  Surgeon: Maryjane Mt, MD;  Location: [facility] OR;  Service: Open Heart Surgery;  Laterality: N/A;   TRANSCATHETER MITRAL EDGE TO EDGE REPAIR N/A 12/14/2018   Procedure: MITRAL VALVE REPAIR;  Surgeon: Wonda Sharper, MD;  Location: [facility] INVASIVE CV LAB;  Service: Cardiovascular;  Laterality: N/A;   TRICUSPID VALVE REPLACEMENT N/A 11/29/2023   Procedure: TRICUSPID VALVE REPAIR USING MC3 EDWARDS TRICUSPID ANNULOPLASTY RING;  Surgeon: Maryjane Mt, MD;  Location: [facility] OR;  Service: Open Heart Surgery;  Laterality: N/A;    Social History   Socioeconomic History   Marital status: Single    Spouse name: Not on file   Number of children: Not on file   Years of education: Not on file   Highest education level: Not on file  Occupational History   Occupation: Retired    Comment: Textile, [employer]  Tobacco Use   Smoking status: Never   Smokeless tobacco: Never  Vaping Use   Vaping status: Never Used  Substance and Sexual Activity   Alcohol use: No    Alcohol/week: 0.0 standard drinks of alcohol   Drug use: No   Sexual activity: Not on file  Other Topics Concern   Not on file  Social History Narrative   Not on file   Social Drivers of Health   Financial Resource Strain: Not on file  Food Insecurity: No Food Insecurity (02/08/2024)   Hunger Vital Sign    Worried About Running Out of Food in the Last Year: Never true    Ran Out of Food in the Last Year: Never true  Transportation Needs: No Transportation Needs (02/08/2024)   PRAPARE - Administrator, Civil Service (Medical): No    Lack of Transportation (Non-Medical): No  Physical Activity: Not on file  Stress: Not on file  Social Connections: Socially Isolated (02/08/2024)   Social Connection and Isolation Panel    Frequency of  Communication with Friends and Family: Twice a week    Frequency of Social Gatherings with Friends and Family: Never    Attends Religious Services: Never    Database administrator or Organizations: No    Attends Banker Meetings: Never    Marital Status: Separated  Intimate Partner Violence: Not At Risk (02/08/2024)   Humiliation, Afraid, Rape, and Kick questionnaire    Fear of Current or Ex-Partner: No    Emotionally Abused: No    Physically Abused: No    Sexually Abused: No   Family History  Problem Relation Age of Onset   Breast cancer Mother 42   Heart disease Father    Breast cancer Sister 63   Colon cancer Neg Hx    Esophageal cancer Neg Hx    Rectal cancer Neg Hx    Stomach cancer Neg Hx  Thyroid  disease Neg Hx     Current Facility-Administered Medications  Medication Dose Route Frequency Provider Last Rate Last Admin   0.9 %  sodium chloride  infusion   Intravenous Continuous Lanis Vanessa BRAVO, MD 10 mL/hr at 07/25/24 9295 Continued from Pre-op  at 07/25/24 9295   ceFAZolin  (ANCEF ) IVPB 2g/100 mL premix  2 g Intravenous 30 min Pre-Op  Massie Mees E, MD       chlorhexidine  (HIBICLENS ) 4 % liquid 4 Application  60 mL Topical Once Kha Hari E, MD       And   NOREEN ON 07/26/2024] chlorhexidine  (HIBICLENS ) 4 % liquid 4 Application  60 mL Topical Once Lanis Vanessa BRAVO, MD       fentaNYL  (SUBLIMAZE ) 100 MCG/2ML injection            midazolam  (VERSED ) 2 MG/2ML injection             Allergies  Allergen Reactions   Spironolactone      hyperkalemia     REVIEW OF SYSTEMS:  [X]  denotes positive finding, [ ]  denotes negative finding Cardiac  Comments:  Chest pain or chest pressure:    Shortness of breath upon exertion:    Short of breath when lying flat:    Irregular heart rhythm:        Vascular    Pain in calf, thigh, or hip brought on by ambulation:    Pain in feet at night that wakes you up from your sleep:     Blood clot in your veins:    Leg  swelling:         Pulmonary    Oxygen at home:    Productive cough:     Wheezing:         Neurologic    Sudden weakness in arms or legs:     Sudden numbness in arms or legs:     Sudden onset of difficulty speaking or slurred speech:    Temporary loss of vision in one eye:     Problems with dizziness:         Gastrointestinal    Blood in stool:     Vomited blood:         Genitourinary    Burning when urinating:     Blood in urine:        Psychiatric    Major depression:         Hematologic    Bleeding problems:    Problems with blood clotting too easily:        Skin    Rashes or ulcers:        Constitutional    Fever or chills:      PHYSICAL EXAMINATION:  Vitals:   07/24/24 0937 07/25/24 0613  BP:  128/74  Pulse:  75  Resp:  14  Temp:  98 F (36.7 C)  TempSrc:  Oral  SpO2:  92%  Weight: 69.1 kg 69.1 kg  Height: 5' 3 (1.6 m) 5' 3 (1.6 m)    General:  WDWN in NAD; vital signs documented above Gait: Not observed HENT: WNL, normocephalic Pulmonary: normal non-labored breathing , without Rales, rhonchi,  wheezing Cardiac: regular HR, Abdomen: soft, NT, no masses Skin: without rashes Vascular Exam/Pulses:  Right Left  Radial 2+ (normal) 2+ (normal)                       Extremities: without ischemic changes, without Gangrene , without cellulitis; without open wounds;  Musculoskeletal: no muscle wasting or atrophy  Neurologic: A&O X 3;  No focal weakness or paresthesias are detected Psychiatric:  The pt has Normal affect.   Non-Invasive Vascular Imaging:   See studies    ASSESSMENT/PLAN:  TERECIA PLAUT is a 73 y.o. female who presents with end stage renal disease  Based on vein mapping and examination, patient does not have suitable superficial veins in the arms.  She will require left arm AV graft as she is a right-handed lady.. I had an extensive discussion with this patient in regards to the nature of access surgery, including risk,  benefits, and alternatives.   The patient is aware that the risks of access surgery include but are not limited to: bleeding, infection, steal syndrome, nerve damage, ischemic monomelic neuropathy, failure of access to mature, complications related to venous hypertension, and possible need for additional access procedures in the future.   The patient has agreed to proceed with the above procedure which will be scheduled at her convenience.  Vanessa FORBES Rim, MD [facility] [phone]

## 2024-07-25 NOTE — Anesthesia Procedure Notes (Addendum)
 Procedure Name: LMA Insertion Date/Time: 07/25/2024 7:51 AM  Performed by: Cindie Donald CROME, CRNAPre-anesthesia Checklist: Patient identified, Emergency Drugs available, Suction available and Patient being monitored Patient Re-evaluated:Patient Re-evaluated prior to induction Oxygen Delivery Method: Circle System Utilized Preoxygenation: Pre-oxygenation with 100% oxygen Induction Type: IV induction Ventilation: Mask ventilation without difficulty LMA: LMA inserted LMA Size: 4.0 Number of attempts: 1 Airway Equipment and Method: Bite block Placement Confirmation: positive ETCO2 Tube secured with: Tape Dental Injury: Teeth and Oropharynx as per pre-operative assessment

## 2024-07-25 NOTE — Discharge Instructions (Signed)
 [facility]  Discharge Instructions  AV Fistula or Graft Surgery for Dialysis Access  Please refer to the following instructions for your post-procedure care. Your surgeon or physician assistant will discuss any changes with you.  Activity  You may drive the day following your surgery, if you are comfortable and no longer taking prescription pain medication. Resume full activity as the soreness in your incision resolves.  Bathing/Showering  You may shower after you go home. Keep your incision dry for 48 hours. Do not soak in a bathtub, hot tub, or swim until the incision heals completely. You may not shower if you have a hemodialysis catheter.  Incision Care  Clean your incision with mild soap and water  after 48 hours. Pat the area dry with a clean towel. You do not need a bandage unless otherwise instructed. Do not apply any ointments or creams to your incision. You may have skin glue on your incision. Do not peel it off. It will come off on its own in about one week. Your arm may swell a bit after surgery. To reduce swelling use pillows to elevate your arm so it is above your heart. Your doctor will tell you if you need to lightly wrap your arm with an ACE bandage.  Diet  Resume your normal diet. There are not special food restrictions following this procedure. In order to heal from your surgery, it is CRITICAL to get adequate nutrition. Your body requires vitamins, minerals, and protein. Vegetables are the best source of vitamins and minerals. Vegetables also provide the perfect balance of protein. Processed food has little nutritional value, so try to avoid this.  Medications  Resume taking all of your medications. If your incision is causing pain, you may take over-the counter pain relievers such as acetaminophen  (Tylenol ). If you were prescribed a stronger pain medication, please be aware these medications can cause nausea and constipation. Prevent  nausea by taking the medication with a snack or meal. Avoid constipation by drinking plenty of fluids and eating foods with high amount of fiber, such as fruits, vegetables, and grains.  Do not take Tylenol  if you are taking prescription pain medications.  Follow up Your surgeon may want to see you in the office following your access surgery. If so, this will be arranged at the time of your surgery.  Please call us  immediately for any of the following conditions:  Increased pain, redness, drainage (pus) from your incision site Fever of 101 degrees or higher Severe or worsening pain at your incision site Hand pain or numbness.  Reduce your risk of vascular disease:  Stop smoking. If you would like help, call [facility] at 1-800-QUIT-NOW (641 329 5622) or [place] at (847) 136-6842  Manage your cholesterol Maintain a desired weight Control your diabetes Keep your blood pressure down  Dialysis  It will take several weeks to several months for your new dialysis access to be ready for use. Your surgeon will determine when it is okay to use it. Your nephrologist will continue to direct your dialysis. You can continue to use your Permcath until your new access is ready for use.   07/25/2024 [name] [id number] 1951-06-05  Surgeon(s): Lanis Fonda BRAVO, MD  Procedure(s): CREATION OF BRACHIOBASILIC ARTERIOVENOUS FISTULA   May stick graft immediately   May stick graft on designated area only:   x Do not stick fistula for 12 weeks    If you have any questions, please call the office at [phone].

## 2024-07-25 NOTE — Transfer of Care (Signed)
 Immediate Anesthesia Transfer of Care Note  [name]  Procedure(s) Performed: CREATION OF BRACHIOBASILLIC ARTERIOVENOUS FISTULA (Left: Arm Upper)  Patient Location: PACU  Anesthesia Type:General  Level of Consciousness: awake, alert , oriented, and patient cooperative  Airway & Oxygen Therapy: Patient Spontanous Breathing  Post-op Assessment: Report given to RN and Post -op Vital signs reviewed and stable  Post vital signs: Reviewed and stable  Last Vitals:  Vitals Value Taken Time  BP 132/80 07/25/24 09:06  Temp    Pulse 73 07/25/24 09:09  Resp 18 07/25/24 09:09  SpO2 95 % 07/25/24 09:09  Vitals shown include unfiled device data.  Last Pain:  Vitals:   07/25/24 0613  TempSrc: Oral      Patients Stated Pain Goal: 0 (07/25/24 0626)  Complications: No notable events documented.

## 2024-07-25 NOTE — Op Note (Addendum)
    NAME: [name]    MRN: [id number] DOB: 1951/03/25    DATE OF OPERATION: 07/25/2024  PREOP DIAGNOSIS:    End stage renal disease  POSTOP DIAGNOSIS:    Same  PROCEDURE:    Left arm brachiobasilic fistula creation  SURGEON: Fonda FORBES Rim  ASSIST: Sherrilee Holster, PA  ANESTHESIA: General  EBL: 10 mL  INDICATIONS:    Vanessa [name] is a 73 y.o. female with history of end-stage renal disease in need of long-term HD access.  FINDINGS:   3.5 mm basilic vein, 3.5 mm brachial artery  TECHNIQUE:     The patient was brought to the operating room and placed in supine position. The left arm was prepped and draped in a standard fashion. IV antibiotics were prior to incision. A timeout was performed.   The basilic vein in the left arm was identified using ultrasound and appeared of sufficient size. A transverse incision was made above the elbow creese in the antecubital fossa. The basilic vein was identified and isolated for 4 cm in length.  The bicipital aponeurosis was partially released and the brachial artery freed from its paired brachial veins and secured with a vessel loop. The patient was heparinized. The basilic vein was marked and ligated distally with 2-0 silk, then flushed with heparinized saline. Vascular clamps were placed proximally and distally on the brachial artery and a 5 mm arteriotomy  was created on the brachial artery. This was flushed with heparin  saline. The vein was juxtaposed to the artery and an anastomosis was created using 6-0 Prolene.   Prior to completing the anastomsis, the vessels were flushed and the suture line was tied down. There was an excellent thrill in the basilic vein from the anastomosis into the upper arm. The patient had a 2+ radial pulse. The incision was irrigated and hemostasis acheived. The deeper tissue was closed with 3-0 Vicryl and the skin closed with 4-0 Monocryl.    Dermabond was applied the incisions. He was transferred to  PACU in stable condition.    Given the complexity of the case,  the assistant was necessary in order to expedient the procedure and safely perform the technical aspects of the operation.  The assistant provided traction and countertraction to assist with exposure of the artery and vein.  They also assisted with suture ligation of multiple venous branches.  They played a critical role in the anastomosis. These skills, especially following the Prolene suture for the anastomosis, could not have been adequately performed by a scrub tech assistant.     Fonda FORBES Rim, MD [facility] DATE OF DICTATION:   07/25/2024

## 2024-07-26 ENCOUNTER — Encounter (HOSPITAL_COMMUNITY): Payer: Self-pay | Admitting: Vascular Surgery

## 2024-07-26 NOTE — Anesthesia Postprocedure Evaluation (Signed)
 Anesthesia Post Note  [name]  Procedure(s) Performed: CREATION OF BRACHIOBASILLIC ARTERIOVENOUS FISTULA (Left: Arm Upper)     Patient location during evaluation: PACU Anesthesia Type: General Level of consciousness: awake and alert Pain management: pain level controlled Vital Signs Assessment: post-procedure vital signs reviewed and stable Respiratory status: spontaneous breathing, nonlabored ventilation, respiratory function stable and patient connected to nasal cannula oxygen Cardiovascular status: blood pressure returned to baseline and stable Postop Assessment: no apparent nausea or vomiting Anesthetic complications: no   No notable events documented.  Last Vitals:  Vitals:   07/25/24 0930 07/25/24 0945  BP: 123/71 127/75  Pulse: 70 72  Resp: 18 20  Temp: 36.6 C   SpO2: 92% 92%    Last Pain:  Vitals:   07/25/24 0930  TempSrc:   PainSc: 3                  Lynwood MARLA Cornea

## 2024-08-03 ENCOUNTER — Telehealth (HOSPITAL_COMMUNITY): Payer: Self-pay | Admitting: Cardiology

## 2024-08-03 ENCOUNTER — Ambulatory Visit: Attending: Cardiology | Admitting: *Deleted

## 2024-08-03 DIAGNOSIS — Z952 Presence of prosthetic heart valve: Secondary | ICD-10-CM | POA: Diagnosis not present

## 2024-08-03 DIAGNOSIS — I4891 Unspecified atrial fibrillation: Secondary | ICD-10-CM | POA: Diagnosis not present

## 2024-08-03 DIAGNOSIS — Z7901 Long term (current) use of anticoagulants: Secondary | ICD-10-CM

## 2024-08-03 LAB — POCT INR: INR: 1.4 — AB (ref 2.0–3.0)

## 2024-08-03 MED ORDER — WARFARIN SODIUM 5 MG PO TABS: ORAL_TABLET | ORAL | 1 refills | Status: DC

## 2024-08-03 NOTE — Telephone Encounter (Signed)
 Patient left VM requesting refill of warfarin to [facility]   Managed by [facility]  cl[facility] Will route to ensure correct dosage is sent

## 2024-08-03 NOTE — Patient Instructions (Addendum)
 Description   INR-1.4; Today take 1.5 tablets of warfarin and tomorrow take 1 tablet of warfarin then continue taking warfarin 1/2 tablet daily except for 1 tablet on Sunday and Thursdays.  Stay consistent with greens (1 per week)   Recheck INR in 2 weeks per patient request-advised of risks. Please let us  know of any upcoming procedures (Fax # [phone]) Coumadin  Clinic [phone] ,  Dialysis M-W-F, Amiodarone  200 mg daily

## 2024-08-03 NOTE — Telephone Encounter (Signed)
 Prescription refill request received for warfarin Lov: 03/31/2024 Next INR check: 9/18 Warfarin tablet strength: 5mg    Refill sent.

## 2024-08-03 NOTE — Progress Notes (Signed)
 Description   INR-1.4; Today take 1.5 tablets of warfarin and tomorrow take 1 tablet of warfarin then continue taking warfarin 1/2 tablet daily except for 1 tablet on Sunday and Thursdays.  Stay consistent with greens (1 per week)   Recheck INR in 2 weeks per patient request-advised of risks. Please let us  know of any upcoming procedures (Fax # [phone]) Coumadin  Clinic [phone] ,  Dialysis M-W-F, Amiodarone  200 mg daily

## 2024-08-04 ENCOUNTER — Other Ambulatory Visit: Payer: Self-pay

## 2024-08-04 DIAGNOSIS — N186 End stage renal disease: Secondary | ICD-10-CM

## 2024-08-15 ENCOUNTER — Ambulatory Visit
Admission: RE | Admit: 2024-08-15 | Discharge: 2024-08-15 | Disposition: A | Discharge status: Home / Self Care | Source: Ambulatory Visit | Attending: Physician Assistant | Admitting: Physician Assistant

## 2024-08-15 ENCOUNTER — Other Ambulatory Visit: Payer: Self-pay | Admitting: Physician Assistant

## 2024-08-15 DIAGNOSIS — N6489 Other specified disorders of breast: Secondary | ICD-10-CM

## 2024-08-17 ENCOUNTER — Other Ambulatory Visit (HOSPITAL_COMMUNITY): Payer: Self-pay | Admitting: Physician Assistant

## 2024-08-17 ENCOUNTER — Ambulatory Visit: Attending: Cardiology

## 2024-08-17 DIAGNOSIS — Z7901 Long term (current) use of anticoagulants: Secondary | ICD-10-CM | POA: Diagnosis not present

## 2024-08-17 DIAGNOSIS — Z952 Presence of prosthetic heart valve: Secondary | ICD-10-CM

## 2024-08-17 LAB — POCT INR: INR: 1.4 — AB (ref 2.0–3.0)

## 2024-08-17 NOTE — Progress Notes (Signed)
 INR 1.4 Please see anticoagulation encounter Take 2 tablets today and 1 tablet tomorrow then continue taking warfarin 1/2 tablet daily except for 1 tablet on Sunday and Thursdays.  Stay consistent with greens (1 per week)   Recheck INR in 3 weeks per patient request-advised of risks. Please let us  know of any upcoming procedures (Fax # [phone]) Coumadin  Clinic [phone] ,  Dialysis M-W-F, Amiodarone  200 mg daily

## 2024-08-17 NOTE — Patient Instructions (Signed)
 Take 2 tablets today and 1 tablet tomorrow then continue taking warfarin 1/2 tablet daily except for 1 tablet on Sunday and Thursdays.  Stay consistent with greens (1 per week)   Recheck INR in 3 weeks per patient request-advised of risks. Please let us  know of any upcoming procedures (Fax # [phone]) Coumadin  Clinic [phone] ,  Dialysis M-W-F, Amiodarone  200 mg daily

## 2024-08-28 NOTE — Progress Notes (Unsigned)
 Office Note     CC:  ESRD Requesting Provider:  Redmon, Noelle, PA  HPI: Vanessa [name] is a Right handed 73 y.o. (July 23, 1951) female with kidney disease who presents status post left arm brachiobasilic fistula creation at the end of August.  A native of [place] , she moved to [place] in 1975 with her husband.  She has worked several jobs, including with the [employer] system for a number of years.  Now retired, she continues to live independently.    On exam, [name] was doing well.  She had no complaints.  She denies symptoms of steal.  Notes that some itchiness at the level of the left wrist. Current access is right internal jugular TDC. Dialysis days are MWF.      Past Medical History:  Diagnosis Date   CHF (congestive heart failure) (HCC)    Dyspnea    ESRD on hemodialysis (HCC)    Heart murmur    Hypertension    Moderate mitral regurgitation    moderate to severe MR with moderate pulmonary HTN   Multinodular goiter    Nonischemic cardiomyopathy (HCC)    EF 30-35% by echo 2015   Pulmonary HTN (HCC)    moderate with PASP by echo 2015   PVC's (premature ventricular contractions)     Past Surgical History:  Procedure Laterality Date   CARDIAC CATHETERIZATION  2003   normal coronary arteries   CARDIAC CATHETERIZATION  2013  Nov   h/o nonischemic DCM EF 35-40% increase LVEDP    EXPLORATION POST OPERATIVE OPEN HEART N/A 11/29/2023   Procedure: EXPLORATION POST OPERATIVE OPEN HEART;  Surgeon: Maryjane Mt, MD;  Location: [facility] OR;  Service: Open Heart Surgery;  Laterality: N/A;   HYSTEROSCOPY WITH D & C  12/02/2012   Procedure: DILATATION AND CURETTAGE /HYSTEROSCOPY;  Surgeon: Rexene PARAS. Rosalva, MD;  Location: [facility] ORS;  Service: Gynecology;  Laterality: N/A;  cervical polypectomy   INSERTION OF ARTERIOVENOUS (AV) ARTEGRAFT ARM Left 07/25/2024   Procedure: CREATION OF BRACHIOBASILLIC ARTERIOVENOUS FISTULA;  Surgeon: Lanis Fonda BRAVO, MD;  Location: [facility]  OR;  Service: Vascular;  Laterality: Left;   IR FLUORO GUIDE CV LINE LEFT  12/24/2023   IR FLUORO GUIDE CV LINE RIGHT  12/24/2023   IR REMOVAL TUN CV CATH W/O FL  01/20/2024   IR US  GUIDE VASC ACCESS LEFT  12/24/2023   IR US  GUIDE VASC ACCESS RIGHT  12/24/2023   LEFT AND RIGHT HEART CATHETERIZATION WITH CORONARY ANGIOGRAM N/A 02/15/2015   Procedure: LEFT AND RIGHT HEART CATHETERIZATION WITH CORONARY ANGIOGRAM;  Surgeon: Candyce GORMAN Reek, MD;  Location: [facility] CATH LAB;  Service: Cardiovascular;  Laterality: N/A;   MITRAL VALVE REPLACEMENT N/A 11/29/2023   Procedure: MITRAL VALVE (MV) REPLACEMENT USING MOSAIC 310CINCH TISSUE VALVE;  Surgeon: Maryjane Mt, MD;  Location: [facility] OR;  Service: Open Heart Surgery;  Laterality: N/A;   RIGHT HEART CATH N/A 12/17/2023   Procedure: RIGHT HEART CATH;  Surgeon: Rolan Ezra GORMAN, MD;  Location: [facility] INVASIVE CV LAB;  Service: Cardiovascular;  Laterality: N/A;   RIGHT/LEFT HEART CATH AND CORONARY ANGIOGRAPHY N/A 10/12/2023   Procedure: RIGHT/LEFT HEART CATH AND CORONARY ANGIOGRAPHY;  Surgeon: Rolan Ezra GORMAN, MD;  Location: [facility] INVASIVE CV LAB;  Service: Cardiovascular;  Laterality: N/A;   TEE WITHOUT CARDIOVERSION N/A 10/11/2018   Procedure: TRANSESOPHAGEAL ECHOCARDIOGRAM (TEE);  Surgeon: Rolan Ezra GORMAN, MD;  Location: [facility] ENDOSCOPY;  Service: Cardiovascular;  Laterality: N/A;   TEE WITHOUT CARDIOVERSION N/A 09/07/2023  Procedure: TRANSESOPHAGEAL ECHOCARDIOGRAM;  Surgeon: Rolan Ezra RAMAN, MD;  Location: [facility] INVASIVE CV LAB;  Service: Cardiovascular;  Laterality: N/A;   TEE WITHOUT CARDIOVERSION N/A 11/29/2023   Procedure: TRANSESOPHAGEAL ECHOCARDIOGRAM;  Surgeon: Maryjane Mt, MD;  Location: [facility] OR;  Service: Open Heart Surgery;  Laterality: N/A;   TRANSCATHETER MITRAL EDGE TO EDGE REPAIR N/A 12/14/2018   Procedure: MITRAL VALVE REPAIR;  Surgeon: Wonda Sharper, MD;  Location: [facility] INVASIVE CV LAB;  Service: Cardiovascular;  Laterality: N/A;   TRICUSPID VALVE  REPLACEMENT N/A 11/29/2023   Procedure: TRICUSPID VALVE REPAIR USING MC3 EDWARDS TRICUSPID ANNULOPLASTY RING;  Surgeon: Maryjane Mt, MD;  Location: [facility] OR;  Service: Open Heart Surgery;  Laterality: N/A;    Social History   Socioeconomic History   Marital status: Single    Spouse name: Not on file   Number of children: Not on file   Years of education: Not on file   Highest education level: Not on file  Occupational History   Occupation: Retired    Comment: Textile, [employer]  Tobacco Use   Smoking status: Never   Smokeless tobacco: Never  Vaping Use   Vaping status: Never Used  Substance and Sexual Activity   Alcohol use: No    Alcohol/week: 0.0 standard drinks of alcohol   Drug use: No   Sexual activity: Not on file  Other Topics Concern   Not on file  Social History Narrative   Not on file   Social Drivers of Health   Financial Resource Strain: Not on file  Food Insecurity: No Food Insecurity (02/08/2024)   Hunger Vital Sign    Worried About Running Out of Food in the Last Year: Never true    Ran Out of Food in the Last Year: Never true  Transportation Needs: No Transportation Needs (02/08/2024)   PRAPARE - Administrator, Civil Service (Medical): No    Lack of Transportation (Non-Medical): No  Physical Activity: Not on file  Stress: Not on file  Social Connections: Socially Isolated (02/08/2024)   Social Connection and Isolation Panel    Frequency of Communication with Friends and Family: Twice a week    Frequency of Social Gatherings with Friends and Family: Never    Attends Religious Services: Never    Database administrator or Organizations: No    Attends Banker Meetings: Never    Marital Status: Separated  Intimate Partner Violence: Not At Risk (02/08/2024)   Humiliation, Afraid, Rape, and Kick questionnaire    Fear of Current or Ex-Partner: No    Emotionally Abused: No    Physically Abused: No    Sexually  Abused: No   Family History  Problem Relation Age of Onset   Breast cancer Mother 30   Heart disease Father    Breast cancer Sister 55   Colon cancer Neg Hx    Esophageal cancer Neg Hx    Rectal cancer Neg Hx    Stomach cancer Neg Hx    Thyroid  disease Neg Hx     Current Outpatient Medications  Medication Sig Dispense Refill   albuterol  (PROVENTIL ) (2.5 MG/3ML) 0.083% nebulizer solution Take 2.5 mg by nebulization 2 (two) times daily.     amiodarone  (PACERONE ) 200 MG tablet Take 1 tablet (200 mg total) by mouth daily. 90 tablet 3   ascorbic acid  (VITAMIN C ) 500 MG tablet Take 0.5 tablets (250 mg total) by mouth 2 (two) times daily. 30 tablet 0   atorvastatin  (  LIPITOR) 10 MG tablet Take 1 tablet (10 mg total) by mouth daily. 90 tablet 3   docusate sodium  (COLACE) 100 MG capsule Take 100 mg by mouth once a week.     methimazole  (TAPAZOLE ) 5 MG tablet Take 1 tablet by mouth once daily 30 tablet 0   midodrine  (PROAMATINE ) 10 MG tablet Take 2 tablets (20 mg total) by mouth 3 (three) times daily with meals. 350 tablet 3   oxyCODONE  (ROXICODONE ) 5 MG immediate release tablet Take 1 tablet (5 mg total) by mouth every 6 (six) hours as needed for severe pain (pain score 7-10). 6 tablet 0   polyethylene glycol powder (GLYCOLAX /MIRALAX ) 17 GM/SCOOP powder Take 17 g by mouth daily as needed. (Patient taking differently: Take 17 g by mouth daily as needed for moderate constipation or mild constipation.) 476 g 0   torsemide  (DEMADEX ) 100 MG tablet Take 1 tablet (100 mg total) by mouth every Tuesday, Thursday, and Saturday at 6 PM. 20 tablet 1   warfarin (COUMADIN ) 5 MG tablet Take 1/2 a tablet to 1 tablet by mouth daily or as directed by the coumadin  clinic. 30 tablet 1   No current facility-administered medications for this visit.    Allergies  Allergen Reactions   Spironolactone      hyperkalemia     REVIEW OF SYSTEMS:  [X]  denotes positive finding, [ ]  denotes negative finding Cardiac   Comments:  Chest pain or chest pressure:    Shortness of breath upon exertion:    Short of breath when lying flat:    Irregular heart rhythm:        Vascular    Pain in calf, thigh, or hip brought on by ambulation:    Pain in feet at night that wakes you up from your sleep:     Blood clot in your veins:    Leg swelling:         Pulmonary    Oxygen at home:    Productive cough:     Wheezing:         Neurologic    Sudden weakness in arms or legs:     Sudden numbness in arms or legs:     Sudden onset of difficulty speaking or slurred speech:    Temporary loss of vision in one eye:     Problems with dizziness:         Gastrointestinal    Blood in stool:     Vomited blood:         Genitourinary    Burning when urinating:     Blood in urine:        Psychiatric    Major depression:         Hematologic    Bleeding problems:    Problems with blood clotting too easily:        Skin    Rashes or ulcers:        Constitutional    Fever or chills:      PHYSICAL EXAMINATION:  There were no vitals filed for this visit.   General:  WDWN in NAD; vital signs documented above Gait: Not observed HENT: WNL, normocephalic Pulmonary: normal non-labored breathing , without Rales, rhonchi,  wheezing Cardiac: regular HR, Abdomen: soft, NT, no masses Skin: without rashes Vascular Exam/Pulses:  Right Left  Radial 2+ (normal) 1+ (normal)                       Extremities: without ischemic  changes, without Gangrene , without cellulitis; without open wounds;  Small hematoma present.  No skin breakdown Musculoskeletal: no muscle wasting or atrophy  Neurologic: A&O X 3;  No focal weakness or paresthesias are detected Psychiatric:  The pt has Normal affect.   Non-Invasive Vascular Imaging:   See studies    ASSESSMENT/PLAN:  ESSIE LAGUNES is a 73 y.o. female who presents status post left arm brachiobasilic fistula creation.  This fistula has an excellent thrill.  I  evaluated the ultrasound demonstrating adequate flow, near-adequate diameter.  The vessel is deep.  Shaneen I had a long discussion regarding left arm brachiobasilic fistula transposition.  After discussing risks and benefits, she elected to proceed.  We will schedule this in the next 2 to 3 weeks to allow for some continued maturation prior to the operation.  Fonda FORBES Rim, MD [facility] [phone]

## 2024-08-31 ENCOUNTER — Ambulatory Visit (HOSPITAL_COMMUNITY)
Admission: RE | Admit: 2024-08-31 | Discharge: 2024-08-31 | Disposition: A | Discharge status: Home / Self Care | Source: Ambulatory Visit | Attending: Vascular Surgery | Admitting: Vascular Surgery

## 2024-08-31 ENCOUNTER — Ambulatory Visit (INDEPENDENT_AMBULATORY_CARE_PROVIDER_SITE_OTHER): Admitting: Vascular Surgery

## 2024-08-31 ENCOUNTER — Encounter: Payer: Self-pay | Admitting: Vascular Surgery

## 2024-08-31 VITALS — BP 133/84 | HR 72 | Temp 97.7°F | Resp 18 | Ht 63.0 in | Wt 152.2 lb

## 2024-08-31 DIAGNOSIS — N186 End stage renal disease: Secondary | ICD-10-CM

## 2024-09-01 ENCOUNTER — Telehealth: Payer: Self-pay

## 2024-09-01 ENCOUNTER — Other Ambulatory Visit: Payer: Self-pay

## 2024-09-01 ENCOUNTER — Telehealth: Payer: Self-pay | Admitting: Cardiology

## 2024-09-01 DIAGNOSIS — N186 End stage renal disease: Secondary | ICD-10-CM

## 2024-09-01 NOTE — Telephone Encounter (Signed)
   Pre-operative Risk Assessment    Patient Name: Vanessa [name]  DOB: 1951/01/14 MRN: [id number]   Date of last office visit: 08/17/24 Date of next office visit: 09/07/24   Request for Surgical Clearance    Procedure:  Fistula Transposition   Date of Surgery:  Clearance 09/11/24                                Surgeon:  Dr. Lanis Socks Group or Practice Name:  [facility]  Phone number:  [phone] Fax number:  [phone]   Type of Clearance Requested:   - Medical  Coumadin  3 day hold    Type of Anesthesia:  MAC/ Peripheral nerve block    Additional requests/questions:    [name]   09/01/2024, 10:42 AM

## 2024-09-01 NOTE — Telephone Encounter (Signed)
 Pt scheduled for surgery on 09/11/24. Coumadin  clinic medication hold request entered. Pt has appt with them next week and they will review then. Pt is aware.

## 2024-09-01 NOTE — Telephone Encounter (Signed)
   Name: Vanessa [name]  DOB: 1951-06-22  MRN: [id number]  Primary Cardiologist: None  Chart reviewed as part of pre-operative protocol coverage. The patient has an upcoming visit scheduled with Dr. Rolan on 09/07/2024 at which time clearance can be addressed in case there are any issues that would impact surgical recommendations.  Fistula Transposition Is not scheduled until 09/11/2024 as below. I added preop FYI to appointment note so that provider is aware to address at time of outpatient visit.  Per office protocol the cardiology provider should forward their finalized clearance decision and recommendations regarding antiplatelet therapy to the requesting party below.    Has coumadin  clinic appointment same day as appt with Dr. Rolan. I have added Pre-OP  to the appt notes for both appointments.    I will route this message as FYI to requesting party and remove this message from the preop box as separate preop APP input not needed at this time.   Please call with any questions.  Lamarr Satterfield, NP  09/01/2024, 10:50 AM

## 2024-09-01 NOTE — Telephone Encounter (Signed)
 Per preop APP Lamarr Satterfield , DNP the pt has appt 09/07/24 with Dr. Rolan, will defer clearance to appt with MD 09/07/24.

## 2024-09-07 ENCOUNTER — Ambulatory Visit
Admission: RE | Admit: 2024-09-07 | Discharge: 2024-09-07 | Disposition: A | Discharge status: Home / Self Care | Source: Ambulatory Visit | Attending: Cardiology | Admitting: Cardiology

## 2024-09-07 ENCOUNTER — Ambulatory Visit: Admitting: *Deleted

## 2024-09-07 ENCOUNTER — Ambulatory Visit (HOSPITAL_COMMUNITY): Payer: Self-pay | Admitting: Cardiology

## 2024-09-07 VITALS — BP 118/76 | HR 86 | Wt 153.2 lb

## 2024-09-07 DIAGNOSIS — E0590 Thyrotoxicosis, unspecified without thyrotoxic crisis or storm: Secondary | ICD-10-CM

## 2024-09-07 DIAGNOSIS — I493 Ventricular premature depolarization: Secondary | ICD-10-CM | POA: Insufficient documentation

## 2024-09-07 DIAGNOSIS — I428 Other cardiomyopathies: Secondary | ICD-10-CM | POA: Diagnosis present

## 2024-09-07 DIAGNOSIS — I082 Rheumatic disorders of both aortic and tricuspid valves: Secondary | ICD-10-CM | POA: Insufficient documentation

## 2024-09-07 DIAGNOSIS — I5022 Chronic systolic (congestive) heart failure: Secondary | ICD-10-CM

## 2024-09-07 DIAGNOSIS — I2729 Other secondary pulmonary hypertension: Secondary | ICD-10-CM | POA: Diagnosis not present

## 2024-09-07 DIAGNOSIS — I272 Pulmonary hypertension, unspecified: Secondary | ICD-10-CM

## 2024-09-07 DIAGNOSIS — Z79899 Other long term (current) drug therapy: Secondary | ICD-10-CM | POA: Diagnosis not present

## 2024-09-07 DIAGNOSIS — Z7901 Long term (current) use of anticoagulants: Secondary | ICD-10-CM | POA: Insufficient documentation

## 2024-09-07 DIAGNOSIS — N186 End stage renal disease: Secondary | ICD-10-CM | POA: Diagnosis not present

## 2024-09-07 DIAGNOSIS — I480 Paroxysmal atrial fibrillation: Secondary | ICD-10-CM | POA: Diagnosis not present

## 2024-09-07 DIAGNOSIS — Z5181 Encounter for therapeutic drug level monitoring: Secondary | ICD-10-CM

## 2024-09-07 DIAGNOSIS — Z992 Dependence on renal dialysis: Secondary | ICD-10-CM | POA: Insufficient documentation

## 2024-09-07 DIAGNOSIS — Z952 Presence of prosthetic heart valve: Secondary | ICD-10-CM | POA: Diagnosis not present

## 2024-09-07 DIAGNOSIS — I4891 Unspecified atrial fibrillation: Secondary | ICD-10-CM

## 2024-09-07 DIAGNOSIS — I340 Nonrheumatic mitral (valve) insufficiency: Secondary | ICD-10-CM | POA: Diagnosis present

## 2024-09-07 LAB — COMPREHENSIVE METABOLIC PANEL WITH GFR
ALT: 18 U/L (ref 0–44)
AST: 21 U/L (ref 15–41)
Albumin: 3.4 g/dL — ABNORMAL LOW (ref 3.5–5.0)
Alkaline Phosphatase: 73 U/L (ref 38–126)
Anion gap: 13 (ref 5–15)
BUN: 21 mg/dL (ref 8–23)
CO2: 26 mmol/L (ref 22–32)
Calcium: 9.4 mg/dL (ref 8.9–10.3)
Chloride: 99 mmol/L (ref 98–111)
Creatinine, Ser: 4.16 mg/dL — ABNORMAL HIGH (ref 0.44–1.00)
GFR, Estimated: 11 mL/min — ABNORMAL LOW
Glucose, Bld: 128 mg/dL — ABNORMAL HIGH (ref 70–99)
Potassium: 4.4 mmol/L (ref 3.5–5.1)
Sodium: 138 mmol/L (ref 135–145)
Total Bilirubin: 5.8 mg/dL — ABNORMAL HIGH (ref 0.0–1.2)
Total Protein: 7.4 g/dL (ref 6.5–8.1)

## 2024-09-07 LAB — POCT INR: POC INR: 1.9

## 2024-09-07 LAB — TSH: TSH: 0.444 u[IU]/mL (ref 0.350–4.500)

## 2024-09-07 LAB — T4, FREE: Free T4: 2.22 ng/dL — ABNORMAL HIGH (ref 0.61–1.12)

## 2024-09-07 NOTE — Patient Instructions (Addendum)
 Description   Called and spoke to pt and instructed her to: Hold warfarin 10/9 to 10/12 The evening of the procedure resume warfarin unless the Dr states otherwise.  When Restarting warfarin START taking warfarin 1/2 a tablet daily except for 1 tablet on Sundays, Tuesdays and Thursdays.  Recheck INR 1 week post procedure. (Fax # [phone]) Coumadin  Clinic [phone] ,  Dialysis M-W-F, Amiodarone  200 mg daily

## 2024-09-07 NOTE — Progress Notes (Signed)
 Lab Results  Component Value Date   INR 1.9 09/07/2024   INR 1.4 (A) 08/17/2024   INR 1.4 (A) 08/03/2024    Description   Called and spoke to pt and instructed her to: Hold warfarin 10/9 to 10/12 The evening of the procedure resume warfarin unless the Dr states otherwise.  When Restarting warfarin START taking warfarin 1/2 a tablet daily except for 1 tablet on Sundays, Tuesdays and Thursdays.  Recheck INR 1 week post procedure. (Fax # [phone]) Coumadin  Clinic [phone] ,  Dialysis M-W-F, Amiodarone  200 mg daily

## 2024-09-07 NOTE — Patient Instructions (Signed)
 There has been no changes to your medications.  Labs done today, your results will be available in MyChart, we will contact you for abnormal readings.  You have been referred to the Endocrinologist. They will call you to arrange your appointment.  Your physician recommends that you schedule a follow-up appointment in: 3 months.  If you have any questions or concerns before your next appointment please send us  a message through [place] or call our office at [phone].    TO LEAVE A MESSAGE FOR THE NURSE SELECT OPTION 2, PLEASE LEAVE A MESSAGE INCLUDING: YOUR NAME DATE OF BIRTH CALL BACK NUMBER REASON FOR CALL**this is important as we prioritize the call backs  YOU WILL RECEIVE A CALL BACK THE SAME DAY AS LONG AS YOU CALL BEFORE 4:00 PM  At the [facility], you and your health needs are our priority. As part of our continuing mission to provide you with exceptional heart care, we have created designated Provider Care Teams. These Care Teams include your primary Cardiologist (physician) and Advanced Practice Providers (APPs- Physician Assistants and Nurse Practitioners) who all work together to provide you with the care you need, when you need it.   You may see any of the following providers on your designated Care Team at your next follow up: Dr Toribio Fuel Dr Ezra Shuck Dr. Ria Commander Dr. Morene Brownie Amy Lenetta, NP Caffie Shed, [place] [facility] [place], [place] Beckey Coe, NP [place] Lee, NP Ellouise Class, NP Tinnie Redman, PharmD Jaun Bash, PharmD   Please be sure to bring in all your medications bottles to every appointment.    Thank you for choosing [facility]

## 2024-09-07 NOTE — Progress Notes (Signed)
 PCP:  Alvera Reagin, PA  HF Cardiologist: Dr. Rolan  Chief complaint: CHF   HPI: Vanessa [name] is a 73 y.o. with history of nonischemic cardiomyopathy and presents for followup of CHF. Patient has had a low EF recognized since at least 9/14, when echo showed EF 40-45%.  In 07-11-2024, her child died, which led to significant stress.  She developed worsening exertional dyspnea and subsequent echo showed a fall in EF to 25-30%.  She had left and right heart catheterization in 3/16 with no significant coronary disease detected and moderately elevated right and left heart filling pressures along with pulmonary venous hypertension.  Echo was done 12/16: EF 45%, diffuse hypokinesis. She had a repeat echo 11/17 => EF 45% with moderately dilated LV, moderate MR.  Echo 11/18 showed EF stable at 45% with mild LV dilation and mildly decreased RV systolic function, moderate MR.   Echo was done in 11/19.  EF 30-35% with mild LV hypertrophy, mild-moderately decreased RV systolic function, possible severe MR with restricted posterior leaflet, PASP 50 mmHg, severe LAE.  TEE was done in 11/19 to assess mitral regurgitation.  This showed severe MR with restricted posterior leaflet.   In 1/20, she had Mitraclip with reduction in MR to 1+.    She had echo in 9/20 with EF 30-35%, severe LAE, normal RV, s/p Mitraclip with mild-moderate MR and mild MS.  The IVC was dilated, suggesting volume overload.   She was unable to tolerate Bidil due to lightheadedness.   Echo in 1/21 showed EF 40-45%, RV normal, biatrial dilation, mild mitral stenosis mean 5 mmHg, mild-moderate MR (s/p Mitraclip).  Echo in 1/22 showed EF 40-45% with moderate LV dilation, normal RV size and systolic function, severe LAE, s/p Mitraclip with probably mild mitral stenosis (mean gradient 7, MVA 2.2 cm^2 by PHT), moderate MR.   Echo in 3/23 showed EF 40-45%, moderate LV dilation, normal RV, normal IVC, s/p MV repair with Mitraclip with mild  mitral stenosis mean gradient 7 mmHg, moderate MR.   Echo (9/23) showed EF stable 40-45%, mild MS (mean gradient 7 mmHg) and mild to moderate MR s/p Mitraclip.   Echo in 9/24 showed EF 40-45%, mild LV dilation and mild LVH, mildly decreased RV systolic function, PASP 47 mmHg, s/p Mitraclip with mean gradient 6 mmHg, suspect severe mitral regurgitation, IVC normal, small secundum ASD.  TEE in 10/24 showed EF 40-45%, mild LV dilation, moderate RV dysfunction with mild RV enlargement, severe LAE, 2 Mitraclips in place with mean gradient 4 mmHg and severe eccentric MR from between the 2 clips with ERO 0.54 cm^2 and peak RV-RA gradient 63 mmHg.  I reviewed the TEE with structural heart colleagues, and there does not appear to be room between the 2 existing Mitraclips to place a 3rd clip.  R/LHC arranged (11/24) as part of pre-surgical evaluation for MVR, which showed no significant CAD, normal RA pressure with mildly elevated PCWP, preserved CO and PAPi, and moderate mixed pulmonary arterial and pulmonary venous hypertension, likely due to mitral valve regurgitation.  She had bioprosthetic mitral valve replacement + tricuspid valve repair on 11/29/23.  She developed post-op surgical site bleeding with tamponade and was taken back to the OR the next day.  She had a very difficult course after this with post-op shock, then AKI with CVVH.  She was eventually transitioned to [facility].  She went to CIR and now is home. Echo (3/25) showed EF 35-40%, moderate RV dysfunction with  moderate RV enlargement, PASP 50 mmHg, bioprosthetic mitral valve with mean gradient up to 13 mmHg with appearance of a fixed posterior leaflet (?severe stenosis), s/p TV repair with mild-moderate TR, mild AS with mean gradient 9 mmHg.  She has been back to the ER several times with hypotension with dialysis and is now on midodrine  20 mg tid + Florinef .   Echo in 7/25 showed EF 30-35%, RV mildly reduced, RVSP 64 mmHg, mean bioprosthetic mitral  valve with gradient 6.5 mmHg, TV repair with mild to moderate TR, mild AS with mean gradient 8 mmHg, dilated IVC  Here today for CHF follow-up. She is gradually getting her strength back.  Walks with cane, no dyspnea walking on flat ground.  She has chronic orthopnea and sleeps on 2 pillows.  She has a chronic cough.  She takes torsemide  on non-HD days but does not urinate much.  She still has difficulty with low BP at HD, using high dose of midodrine .  Low BP sometimes limits her ultrafiltration. She recently stopped methimazole  on her own because of hair loss. She is going to need surgery soon for AV fistula placement.   ECG (personally reviewed): NSR, 1st degree AVB, IVCD 124 msec  PMH: 1. Cardiomyopathy: Nonischemic.  Echo (9/14) with EF 40-45%.  Echo (2/16) with EF 25-30%.  Echo (4/16) with EF 30-35%, moderate MR.  Echo (6/16) with EF 40%, moderate LV dilation, moderate diastolic dysfunction, moderate MR, moderate TR, PA systolic pressure 52 mmHg. RHC/LHC (3/16) with no significant CAD, EF 20-25%, 3+ MR; mean RA 13, PA 70/30 mean 45, mean PCWP 31 mmHg, CI 2.2, PVR 3.1 WU.  Echo (12/16) with EF 45%, diffuse hypokinesis, moderate MR, normal RV size and systolic function.  - Echo (11/17): EF 45%, moderately dilated LV, moderate eccentric posterior MR, PASP 42 mmHg, normal RV size and systolic function.  - Echo (11/18): EF 45%, mild LV dilation, moderate diastolic dysfunction, normal RV size with mildly decreased systolic function, PASP 50 mmHg, moderate MR.  - Echo (11/19): EF 30-35%, mild LV dilation, severe LAE, mild-moderately decreased RV systolic function, possible severe MR with restricted posterior leaflet, PASP 50 mmHg.  - Echo (1/20): EF 30-35%, severe LV dilation, s/p Mitraclip with mild MR and mild MS, PASP 48 mmHg.  - RHC (1/20, with Mitraclip): mean RA 10, mean LA 18 - Echo (2/20): EF 30-35%, s/p mitraclip with mild mitral stenosis, mean MV gradient 5 mmHg, moderate MR, PASP 56 mmHg.  -  Echo (9/20): EF 30-35%, severe LAE, normal RV, s/p Mitraclip with mild-moderate MR and mild MS.  The IVC was dilated, suggesting volume overload.  - Echo (1/21): EF 40-45%, RV normal, biatrial dilation, mild mitral stenosis mean 5 mmHg, mild-moderate MR (s/p Mitraclip).  - Echo (1/22): EF 40-45% with moderate LV dilation, normal RV size and systolic function, severe LAE, s/p Mitraclip with probably mild mitral stenosis (mean gradient 7, MVA 2.2 cm^2 by PHT), moderate MR. - Echo (3/23): EF 40-45%, moderate LV dilation, normal RV, normal IVC, s/p MV repair with Mitraclip with mild mitral stenosis mean gradient 7 mmHg, moderate MR.  - Echo (9/23): EF 40-45%, grade I DD, normal RV, s/p MC repair with Mitraclip with mild mitral stenosis mean gradient 7 mmHg, moderate MR. - Echo (9/24): EF 40-45%, mild LV dilation and mild LVH, mildly decreased RV systolic function, PASP 47 mmHg, s/p Mitraclip with mean gradient 6 mmHg, suspect severe mitral regurgitation, IVC normal, small secundum ASD.  - TEE (10/24): EF 40-45%, mild LV  dilation, moderate RV dysfunction with mild RV enlargement, severe LAE, 2 Mitraclips in place with mean gradient 4 mmHg and severe eccentric MR from between the 2 clips with ERO 0.54 cm^2 and peak RV-RA gradient 63 mmHg. - R/LHC (11/24): no significant CAD; RA 5, PA 61/19 (39), PCWP mean 18, CO/CI (Fick) 4.67/2.52, PVR 4.5 WU - Echo (3/25): EF 35-40%, moderate RV dysfunction with moderate RV enlargement, PASP 50 mmHg, bioprosthetic mitral valve with mean gradient up to 13 mmHg with appearance of a fixed posterior leaflet (?severe stenosis), s/p TV repair with mild-moderate TR, mild AS with mean gradient 9 mmHg.  - Echo (7/25): EF 30-35%, RV mildly reduced, RVSP 64 mmHg, mean bioprosthetic mitral valve with gradient 6.5 mmHg, TV repair with mild to moderate TR, mild AS with mean gradient 8 mmHg, dilated IVC 2. PVCs: Holter (10/15) with 12% PVCs. 3. Pulmonary venous hypertension: CTA chest  (7/16) negative for PE. PFTs (7/16) with minimal obstruction, mild restriction, moderately decreased DLCO.  4. Thyroid  nodules: Biopsy benign.  5. ESRD: Post-op MVR.   6. Mitral regurgitation: TEE (11/19) with EF 30-35%, moderate LV dilation, severe MR with posterior leaflet restriction and ERO 0.41 cm^2, no mitral stenosis, mild to moderately decreased RV systolic function, peak RV-RA gradient 50 mmHg.  - Mitraclip placement x 2 in 1/20 with 1+ residual MR.  - Echo (2/20) with moderate residual MR, mild MS with mean gradient 5 mmHg.  - Echo (9/20) with mild-moderate MR, mild MS.  - Echo (1/21) with mild-moderate MR, mild MS s/p Mitraclip - Echo (1/22) with moderate residual MR, mild-moderate mitral stenosis.  - Echo (3/23) with moderate MR, mild MS s/p Mitraclip.  - Echo (9/23) with mild-moderate MR, mild MS mean gradient 7 mmHg - Echo (9/24) with severe MR, mild MS mean gradient 6 mmHg post-Mitraclip.  - TEE (10/24): with 2 Mitraclips in place with mean gradient 4 mmHg and severe eccentric MR from between the 2 clips with ERO 0.54 cm^2. - Bioprosthetic mitral valve replacement + tricuspid valve repair on 11/29/23. 7. Hyperthyroism: Toxic multinodular goiter.  8. Atrial fibrillation: Paroxysmal.   SH: Lives alone, only child passed away in Jul 11, 2024, nonsmoker, no ETOH or drugs.   FH: Father with MIs  ROS: All systems reviewed and negative except as per HPI.   Current Outpatient Medications  Medication Sig Dispense Refill   albuterol  (PROVENTIL ) (2.5 MG/3ML) 0.083% nebulizer solution Take 2.5 mg by nebulization 2 (two) times daily.     amiodarone  (PACERONE ) 200 MG tablet Take 1 tablet (200 mg total) by mouth daily. 90 tablet 3   ascorbic acid  (VITAMIN C ) 500 MG tablet Take 0.5 tablets (250 mg total) by mouth 2 (two) times daily. 30 tablet 0   atorvastatin  (LIPITOR) 10 MG tablet Take 1 tablet (10 mg total) by mouth daily. 90 tablet 3   methimazole  (TAPAZOLE ) 5 MG tablet Take 1 tablet by mouth  once daily 30 tablet 0   midodrine  (PROAMATINE ) 10 MG tablet Take 2 tablets (20 mg total) by mouth 3 (three) times daily with meals. 350 tablet 3   torsemide  (DEMADEX ) 100 MG tablet Take 1 tablet (100 mg total) by mouth every Tuesday, Thursday, and Saturday at 6 PM. 20 tablet 1   warfarin (COUMADIN ) 5 MG tablet Take 1/2 a tablet to 1 tablet by mouth daily or as directed by the coumadin  clinic. (Patient taking differently: Take 2.5-5 mg by mouth See admin instructions. Take 2.5mg  daily except 5mg  on Sundays and Thursdays.) 30 tablet 1  iron  sucrose in sodium chloride  0.9 % 100 mL 50 mg.     Methoxy PEG-Epoetin Beta (MIRCERA IJ) 30 mcg.     oxycodone  (OXY-IR) 5 MG capsule Take 5 mg by mouth every 4 (four) hours as needed for pain.     No current facility-administered medications for this encounter.   BP 118/76   Pulse 86   Wt 69.5 kg (153 lb 3.2 oz)   SpO2 93%   BMI 27.14 kg/m    Wt Readings from Last 3 Encounters:  09/07/24 69.5 kg (153 lb 3.2 oz)  08/31/24 69 kg (152 lb 3.2 oz)  07/25/24 69.1 kg (152 lb 5.4 oz)  General: NAD Neck:JVP 14+ cm, no thyromegaly or thyroid  nodule.  Lungs: Clear to auscultation bilaterally with normal respiratory effort. CV: Nondisplaced PMI.  Heart regular S1/S2, no S3/S4, 1/6 SEM RUSB.  1+ edema to knees.  No carotid bruit.  Normal pedal pulses.  Abdomen: Soft, nontender, no hepatosplenomegaly, no distention.  Skin: Intact without lesions or rashes.  Neurologic: Alert and oriented x 3.  Psych: Normal affect. Extremities: No clubbing or cyanosis.  HEENT: Normal.   Assessment/Plan: 1. Chronic systolic CHF: Long history of nonischemic cardiomyopathy and mitral regurgitation.  Bioprosthetic MVR and tricuspid ring 11/29/23 with Dr. Maryjane. Pre-op  cath with no significant CAD.  Pre-op  echo with EF 40-45%, severe MR. Post-op shock/AKI, went back to the OR with post-op tamponade for clean-out.  Required CVVH then iHD. Post-op echo 3/25 with EF 35-40%, moderate  RV dysfunction with moderate RV enlargement, PASP 50 mmHg, bioprosthetic mitral valve with mean gradient up to 13 mmHg with appearance of a fixed posterior leaflet (?severe stenosis), s/p TV repair with mild-moderate TR, mild AS with mean gradient 9 mmHg.  Repeat echo 7/25 EF 30-35%, RV mildly reduced, RVSP 64 mmHg, bioprosthetic mitral valve with lower mean gradient at 6.5 mmHg, TV repair with mild to moderate TR, mild AS with mean gradient 8 mmHg, dilated IVC. She is volume overloaded on exam with NYHA class III symptoms.  - Needs to have her dry weight lowered but volume removal has been limited by hypotension at HD.  Would consider HD 4 days a week to manage volume more effectively.  - Continue midodrine  to keep BP up at HD.  - She takes 100 mg Torsemide  on non-HD days but does not urinate much.  2. Mitral regurgitation/tricuspid regurgitation:  TEE in 11/19 with severe MR, restricted posterior leaflet.  She had Mitraclip x 2 in 1/20. Post-op echo showed mild MR and mild mitral stenosis.   Echo in 9/24 showed MV s/p Mitraclip x 2 with mean gradient 6 mmHg and severe mitral regurgitation. TEE was then done to evaluate, showing 2 Mitraclips in place with mean gradient 4 mmHg and severe eccentric MR from between the 2 clips with ERO 0.54 cm^2.  She was not thought to be a candidate for an additional Mitraclip due to lack of room between the two existing clips. R/LHC (11/24) showed moderate mixed pulmonary arterial and pulmonary venous hypertension likely due to mitral valve regurgitation.  S/p MV replacement and tricuspid repair on 11/29/23 with Dr. Maryjane. Echo in 3/25 showed bioprosthetic mitral valve with mean gradient up to 13 mmHg with appearance of a fixed posterior leaflet (?severe stenosis).  She would not be a candidate for redo surgery and is not interested in invasive procedure such as TMVR. Repeat echo 7/25 with EF 30-35%, RV mildly reduced, RVSP 64 mmHg, bioprosthetic mitral valve with lower mean  gradient at 6.5 mmHg, TV repair with mild to moderate TR, mild AS with mean gradient 8 mmHg, dilated IVC. - Needs antibiotics with dental work.  3. PVCs: Has been on amiodarone .  - Continue amiodarone  200 mg daily.  Check LFT and TFTs, she will need regular eye exam.  4. Pulmonary hypertension: Pre-op  RHC (11/24) with PVR 4.5, with moderate mixed pulmonary arterial and pulmonary venous hypertension likely due to mitral valve regurgitation. s/p MVR, PA pressure severely elevated post-op.  Suspect still mixed PAH/PVH with elevated PCWP.  Will hold off on pulmonary vasodilators; small clinical trials demonstrate no improvement and some markers of harm with PDE5i in PH in the setting of mitral valve disease. RHC 12/17/23 with mild pulmonary arterial hypertension.  5. Toxic multinodular goiter: She has been on amiodarone  and has had hyperthyroidism.  She stopped methimazole  recently.   - Check TSH, free T3 and free T4.   - She needs to followup with endocrinology.  6. ESRD: Developed post-op MVR.  She has struggled with HD due to RV failure and hypotension.  Currently requiring midodrine  20 mg tid.  - Continue midodrine .  - Need to try to get more fluid off as above - Patient reports Nephrology team is planning fistula creation.  She can hold warfarin for fistula surgery.  7. Atrial fibrillation: Paroxysmal. NSR today.  - Continue po amiodarone  - Continue warfarin.  Followup 3 months with APP  I spent 31 minutes reviewing records, interviewing/examining patient, and managing orders.    Signed, Ezra Shuck, MD  09/07/2024

## 2024-09-07 NOTE — Telephone Encounter (Signed)
 Patient with diagnosis of Afib and bioprosthetic MV on warfarin for anticoagulation.    Procedure: Fistula Transposition  Date of procedure: 09/11/2024    CHA2DS2-VASc Score = 4   This indicates a 4.8% annual risk of stroke. The patient's score is based upon: CHF History: 1 HTN History: 1 Diabetes History: 0 Stroke History: 0 Vascular Disease History: 0 Age Score: 1 Gender Score: 1    CrCl 11 mL/min ESRD Platelet count 186 K  Patient has not  had an Afib/aflutter ablation in the last 3 months, DCCV within the last 4 weeks or a watchman implanted in the last 45 days     Per office protocol, patient can hold Warfarin for 4 days prior to procedure.   Patient will not need bridging with Lovenox  (enoxaparin ) around procedure.  **This guidance is not considered finalized until pre-operative APP has relayed final recommendations.**

## 2024-09-08 ENCOUNTER — Other Ambulatory Visit: Payer: Self-pay

## 2024-09-08 ENCOUNTER — Encounter (HOSPITAL_COMMUNITY): Payer: Self-pay | Admitting: Vascular Surgery

## 2024-09-08 LAB — T3, FREE: T3, Free: 2.4 pg/mL (ref 2.0–4.4)

## 2024-09-08 NOTE — Progress Notes (Addendum)
 Case: [id number] Date/Time: 09/11/24 0916   Procedure: TRANSPOSITION, VEIN, BASILIC (Left)   Anesthesia type: Monitor Anesthesia Care   Diagnosis: End stage renal disease (HCC) [N18.6]   Pre-op  diagnosis: End Stage Renal Disease   [facility] ROOM 11 / [facility]   Surgeons: Lanis Fonda BRAVO, MD       DISCUSSION: Vanessa [name] is a 73 yo female with PMH of HTN, NICM EF 30%, mild pHTN, severe MR s/p bioprosthetic MVR 10/2023 (with concomitant tricuspid valve repair), hypotension on midodrine  20 mg tid, PVCs, paroxysmal atrial fibrillation on amiodarone  and warfarin.   Patient underwent Bioprosthetic MVR and tricuspid ring 11/29/23 with Dr. Maryjane. Pre-op  cath with no significant CAD.  Pre-op  echo with EF 40-45%, severe MR. Post-op shock/AKI, went back to the OR with post-op tamponade for clean-out.  Required CVVH then iHD. Post-op echo in March 2025 with EF 35-40%, moderate RV dysfunction with moderate RV enlargement, PASP 50 mmHg, bioprosthetic mitral valve with mean gradient up to 13 mmHg with appearance of a fixed posterior leaflet (?severe stenosis), s/p TV repair with mild-moderate TR, mild AS with mean gradient 9 mmHg.  Repeat echo in July 2025 EF 30-35%, RV mildly reduced, RVSP 64 mmHg, bioprosthetic mitral valve with lower mean gradient at 6.5 mmHg, TV repair with mild to moderate TR, mild AS with mean gradient 8 mmHg, dilated IVC. Per cardiology notes, she would not be a candidate for redo surgery and is not interested in invasive procedure such as TMVR.     She was last seen in follow-up in the advanced heart failure clinic by Dr. Rolan on 09/07/24. Per note:  - Needs to have her dry weight lowered but volume removal has been limited by hypotension at HD.  Would consider HD 4 days a week to manage volume more effectively.  - Continue midodrine  to keep BP up at HD.  - She takes 100 mg Torsemide  on non-HD days but does not urinate much.  History of toxic multinodular goiter. Previously  followed by endocrinology but lost to followup. Thyroid  studies ordered by Dr. Rolan show TSH 0.444, T3 2.4, and T4 2.22. She was advised to restart Methiamazole and f/u with endocrine by Dr. Rolan.   CT C/A/P 12/13/23 shows markedly enlarged left thyroid  gland with rightward tracheal deviation.    History of difficult airway. Per anesthesia airway note 11/29/23, Difficulty Due To: Difficulty was anticipated, Difficult Airway- due to reduced neck mobility and Difficult Airway- due to anterior larynx Future Recommendations: Recommend- induction with short-acting agent, and alternative techniques readily available Comments: Previous difficult airway noted; all equipment immediately available. DL x1 with Glide 3 with grade 2 view; blade removed and head repositioned; DL x2 with glide 3 grade 2 view; ETT passed through cords. +chest rise and fall, +EtCO2, +BBS.   Last dose Coumadin  10/9   Dialyzing via R TDC on MWF schedule.    She will need DOS labs and eval.   VS: Ht 5' 3 (1.6 m)   Wt 69.5 kg   BMI 27.14 kg/m   PROVIDERS: Redmon, Sydelle, PA   LABS: Labs DOS  EKG 09/07/24:  Sinus rhythm with 1st degree A-V block Non-specific intra-ventricular conduction delay Marked ST abnormality, possible inferior subendocardial injury Abnormal ECG When compared with ECG of 20-Apr-2024 15:40, Questionable change in QRS axis Confirmed by Rolan Barrack  TTE 06/22/24:  1. Left ventricular ejection fraction, by estimation, is 30 to 35%. The  left ventricle has moderately decreased function. The left ventricle  demonstrates global hypokinesis. The left ventricular internal cavity size  was mildly dilated. Left ventricular  diastolic function could not be evaluated.   2. Right ventricular systolic function is mildly reduced. The right  ventricular size is mildly enlarged. There is severely elevated pulmonary  artery systolic pressure. The estimated right ventricular systolic  pressure is 60.4  mmHg.   3. The mitral valve has been repaired/replaced. No evidence of mitral  valve regurgitation. Mild mitral stenosis. The mean mitral valve gradient  is 6.5 mmHg. There is a 21 mm Medtronic bioprosthetic valve present in the  mitral position. Procedure Date:  11/29/23.   4. The tricuspid valve is has been repaired/replaced. The tricuspid valve  is status post repair with an annuloplasty ring. Tricuspid valve  regurgitation is mild to moderate.   5. The aortic valve is tricuspid. Aortic valve regurgitation is trivial.  Mild aortic valve stenosis. Aortic valve mean gradient measures 8.0 mmHg.  Aortic valve Vmax measures 1.80 m/s. Although the mean AVG and Vmax are  c/w no AS, the DVI is 0.49 and the   SVI is low at 21. Suspect patient has paradoxical mild low flow low  gradient aortic stenosis.   6. The inferior vena cava is dilated in size with <50% respiratory  variability, suggesting right atrial pressure of 15 mmHg.     Past Medical History:  Diagnosis Date   CHF (congestive heart failure) (HCC)    Dyspnea    occasional w/exertion   ESRD on hemodialysis (HCC)    dialysis Mon Wed Fri   Heart murmur    Hypertension    Moderate mitral regurgitation    moderate to severe MR with moderate pulmonary HTN   Multinodular goiter    Nonischemic cardiomyopathy (HCC)    EF 30-35% by echo 2015   Pulmonary HTN (HCC)    moderate with PASP by echo 2015   PVC's (premature ventricular contractions)     Past Surgical History:  Procedure Laterality Date   CARDIAC CATHETERIZATION  2003   normal coronary arteries   CARDIAC CATHETERIZATION  2013  Nov   h/o nonischemic DCM EF 35-40% increase LVEDP    EXPLORATION POST OPERATIVE OPEN HEART N/A 11/29/2023   Procedure: EXPLORATION POST OPERATIVE OPEN HEART;  Surgeon: Maryjane Mt, MD;  Location: [facility];  Service: Open Heart Surgery;  Laterality: N/A;   HYSTEROSCOPY WITH D & C  12/02/2012   Procedure: DILATATION AND CURETTAGE  /HYSTEROSCOPY;  Surgeon: Rexene PARAS. Rosalva, MD;  Location: [facility] ORS;  Service: Gynecology;  Laterality: N/A;  cervical polypectomy   INSERTION OF ARTERIOVENOUS (AV) ARTEGRAFT ARM Left 07/25/2024   Procedure: CREATION OF BRACHIOBASILLIC ARTERIOVENOUS FISTULA;  Surgeon: Lanis Fonda BRAVO, MD;  Location: [facility] OR;  Service: Vascular;  Laterality: Left;   IR FLUORO GUIDE CV LINE LEFT  12/24/2023   IR FLUORO GUIDE CV LINE RIGHT  12/24/2023   IR REMOVAL TUN CV CATH W/O FL  01/20/2024   IR US  GUIDE VASC ACCESS LEFT  12/24/2023   IR US  GUIDE VASC ACCESS RIGHT  12/24/2023   LEFT AND RIGHT HEART CATHETERIZATION WITH CORONARY ANGIOGRAM N/A 02/15/2015   Procedure: LEFT AND RIGHT HEART CATHETERIZATION WITH CORONARY ANGIOGRAM;  Surgeon: Candyce GORMAN Reek, MD;  Location: [facility] CATH LAB;  Service: Cardiovascular;  Laterality: N/A;   MITRAL VALVE REPLACEMENT N/A 11/29/2023   Procedure: MITRAL VALVE (MV) REPLACEMENT USING MOSAIC 310CINCH TISSUE VALVE;  Surgeon: Maryjane Mt, MD;  Location: [facility];  Service: Open Heart Surgery;  Laterality: N/A;   RIGHT HEART CATH N/A 12/17/2023   Procedure: RIGHT HEART CATH;  Surgeon: Rolan Ezra RAMAN, MD;  Location: [facility] INVASIVE CV LAB;  Service: Cardiovascular;  Laterality: N/A;   RIGHT/LEFT HEART CATH AND CORONARY ANGIOGRAPHY N/A 10/12/2023   Procedure: RIGHT/LEFT HEART CATH AND CORONARY ANGIOGRAPHY;  Surgeon: Rolan Ezra RAMAN, MD;  Location: [facility] INVASIVE CV LAB;  Service: Cardiovascular;  Laterality: N/A;   TEE WITHOUT CARDIOVERSION N/A 10/11/2018   Procedure: TRANSESOPHAGEAL ECHOCARDIOGRAM (TEE);  Surgeon: Rolan Ezra RAMAN, MD;  Location: [facility] ENDOSCOPY;  Service: Cardiovascular;  Laterality: N/A;   TEE WITHOUT CARDIOVERSION N/A 09/07/2023   Procedure: TRANSESOPHAGEAL ECHOCARDIOGRAM;  Surgeon: Rolan Ezra RAMAN, MD;  Location: [facility] INVASIVE CV LAB;  Service: Cardiovascular;  Laterality: N/A;   TEE WITHOUT CARDIOVERSION N/A 11/29/2023   Procedure: TRANSESOPHAGEAL ECHOCARDIOGRAM;  Surgeon: Maryjane Mt, MD;  Location: [facility] OR;  Service: Open Heart Surgery;  Laterality: N/A;   TRANSCATHETER MITRAL EDGE TO EDGE REPAIR N/A 12/14/2018   Procedure: MITRAL VALVE REPAIR;  Surgeon: Wonda Sharper, MD;  Location: [facility] INVASIVE CV LAB;  Service: Cardiovascular;  Laterality: N/A;   TRICUSPID VALVE REPLACEMENT N/A 11/29/2023   Procedure: TRICUSPID VALVE REPAIR USING MC3 EDWARDS TRICUSPID ANNULOPLASTY RING;  Surgeon: Maryjane Mt, MD;  Location: [facility];  Service: Open Heart Surgery;  Laterality: N/A;    MEDICATIONS: No current facility-administered medications for this encounter.    albuterol  (PROVENTIL ) (2.5 MG/3ML) 0.083% nebulizer solution   amiodarone  (PACERONE ) 200 MG tablet   ascorbic acid  (VITAMIN C ) 500 MG tablet   atorvastatin  (LIPITOR) 10 MG tablet   methimazole  (TAPAZOLE ) 5 MG tablet   midodrine  (PROAMATINE ) 10 MG tablet   oxycodone  (OXY-IR) 5 MG capsule   torsemide  (DEMADEX ) 100 MG tablet   warfarin (COUMADIN ) 5 MG tablet   iron  sucrose in sodium chloride  0.9 % 100 mL   Methoxy PEG-Epoetin Beta (MIRCERA IJ)   Burnard CHRISTELLA Senna, PA-C [facility] Surgical Short Stay/Anesthesiology [facility] Phone [phone] 09/08/2024 3:59 PM

## 2024-09-08 NOTE — Anesthesia Preprocedure Evaluation (Addendum)
 Anesthesia Evaluation  Patient identified by MRN, date of birth, ID band Patient awake    Reviewed: Allergy & Precautions, NPO status , Patient's Chart, lab work & pertinent test results  Airway Mallampati: II  TM Distance: >3 FB Neck ROM: Full    Dental no notable dental hx. (+) Teeth Intact, Dental Advisory Given   Pulmonary shortness of breath   Pulmonary exam normal breath sounds clear to auscultation       Cardiovascular hypertension, Pt. on medications pulmonary hypertension+CHF  Normal cardiovascular exam+ dysrhythmias Atrial Fibrillation + Valvular Problems/Murmurs (s/p MVR) MR  Rhythm:Regular Rate:Normal  TTE 2025 1. Left ventricular ejection fraction, by estimation, is 30 to 35%. The  left ventricle has moderately decreased function. The left ventricle  demonstrates global hypokinesis. The left ventricular internal cavity size  was mildly dilated. Left ventricular  diastolic function could not be evaluated.   2. Right ventricular systolic function is mildly reduced. The right  ventricular size is mildly enlarged. There is severely elevated pulmonary  artery systolic pressure. The estimated right ventricular systolic  pressure is 60.4 mmHg.   3. The mitral valve has been repaired/replaced. No evidence of mitral  valve regurgitation. Mild mitral stenosis. The mean mitral valve gradient  is 6.5 mmHg. There is a 21 mm Medtronic bioprosthetic valve present in the  mitral position. Procedure Date:  11/29/23.   4. The tricuspid valve is has been repaired/replaced. The tricuspid valve  is status post repair with an annuloplasty ring. Tricuspid valve  regurgitation is mild to moderate.   5. The aortic valve is tricuspid. Aortic valve regurgitation is trivial.  Mild aortic valve stenosis. Aortic valve mean gradient measures 8.0 mmHg.  Aortic valve Vmax measures 1.80 m/s. Although the mean AVG and Vmax are  c/w no AS, the DVI  is 0.49 and the   SVI is low at 21. Suspect patient has paradoxical mild low flow low  gradient aortic stenosis.   6. The inferior vena cava is dilated in size with <50% respiratory  variability, suggesting right atrial pressure of 15 mmHg.   Right heart cath 2025 1. Low filling pressures.  2. Mild pulmonary artery hypertension 3. Low cardiac output, CI 1.74 by Fick.  4. Preserved PAPi     Neuro/Psych  PSYCHIATRIC DISORDERS  Depression    negative neurological ROS     GI/Hepatic negative GI ROS, Neg liver ROS,,,  Endo/Other  negative endocrine ROS    Renal/GU ESRF and DialysisRenal disease (dialysis MWF)Lab Results      Component                Value               Date                      NA                       137                 09/11/2024                CL                       104                 09/11/2024                K  4.5                 09/11/2024                CO2                      26                  09/07/2024                BUN                      72 (H)              09/11/2024                CREATININE               7.40 (H)            09/11/2024                GFRNONAA                 11 (L)              09/07/2024                CALCIUM                   9.4                 09/07/2024                PHOS                     3.3                 02/14/2024                ALBUMIN                   3.4 (L)             09/07/2024                GLUCOSE                  102 (H)             09/11/2024             negative genitourinary   Musculoskeletal negative musculoskeletal ROS (+)    Abdominal   Peds  Hematology  (+) Blood dyscrasia (coumadin ), anemia   Anesthesia Other Findings Last airway note: Previous difficult airway noted; all equipment immediately available. DL x1 with Glide 3 with grade 2 view; blade removed and head repositioned; DL x2 with glide 3 grade 2 view  Reproductive/Obstetrics                               Anesthesia Physical Anesthesia Plan  ASA: 3  Anesthesia Plan: General   Post-op Pain Management: Tylenol  PO (pre-op )*   Induction: Intravenous  PONV Risk Score and Plan: 3 and Ondansetron , Dexamethasone  and Treatment may vary due to age or medical condition  Airway Management Planned: LMA  Additional Equipment:   Intra-op Plan:   Post-operative Plan: Extubation in OR  Informed Consent: I have reviewed the patients History and Physical, chart,  labs and discussed the procedure including the risks, benefits and alternatives for the proposed anesthesia with the patient or authorized representative who has indicated his/her understanding and acceptance.     Dental advisory given  Plan Discussed with: CRNA  Anesthesia Plan Comments: (See PAT note from 10/10)         Anesthesia Quick Evaluation

## 2024-09-08 NOTE — Progress Notes (Signed)
 PCP - Sydelle Close, PA Cardiologist - Dr Ezra Shuck (req on 09/01/24, OV on 09/07/24-clearance)  Chest x-ray - 02/14/24 EKG - 09/07/24 Stress Test - 09/21/12 ECHO - 06/22/24 Cardiac Cath - 12/17/23  ICD Pacemaker/Loop - n/a  Sleep Study -  n/a  Diabetes - n/a  Blood Thinner Instructions:  Last dose was 09/07/24  Aspirin  Instructions: n/a  NPO  Anesthesia review: Yes  STOP now taking any Aspirin  (unless otherwise instructed by your surgeon), Aleve, Naproxen, Ibuprofen , Motrin , Advil , Goody's, BC's, all herbal medications, fish oil, and all vitamins.   Coronavirus Screening Do you have any of the following symptoms:  Cough yes/no: No Fever (>100.47F)  yes/no: No Runny nose yes/no: No Sore throat yes/no: No Difficulty breathing/shortness of breath  yes/no: No  Have you traveled in the last 14 days and where? yes/no: No  Patient verbalized understanding of instructions that were given via phone.

## 2024-09-11 ENCOUNTER — Ambulatory Visit (HOSPITAL_COMMUNITY): Payer: Self-pay | Admitting: Medical

## 2024-09-11 ENCOUNTER — Ambulatory Visit (HOSPITAL_COMMUNITY)
Admission: RE | Admit: 2024-09-11 | Discharge: 2024-09-11 | Disposition: A | Discharge status: Home / Self Care | Attending: Vascular Surgery | Admitting: Vascular Surgery

## 2024-09-11 ENCOUNTER — Encounter (HOSPITAL_COMMUNITY)
Admission: RE | Disposition: A | Discharge status: Home / Self Care | Payer: Self-pay | Source: Home / Self Care | Attending: Vascular Surgery

## 2024-09-11 ENCOUNTER — Other Ambulatory Visit: Payer: Self-pay

## 2024-09-11 DIAGNOSIS — I272 Pulmonary hypertension, unspecified: Secondary | ICD-10-CM | POA: Insufficient documentation

## 2024-09-11 DIAGNOSIS — Z7901 Long term (current) use of anticoagulants: Secondary | ICD-10-CM | POA: Insufficient documentation

## 2024-09-11 DIAGNOSIS — I5033 Acute on chronic diastolic (congestive) heart failure: Secondary | ICD-10-CM | POA: Diagnosis not present

## 2024-09-11 DIAGNOSIS — N186 End stage renal disease: Secondary | ICD-10-CM | POA: Diagnosis not present

## 2024-09-11 DIAGNOSIS — T82590A Other mechanical complication of surgically created arteriovenous fistula, initial encounter: Secondary | ICD-10-CM

## 2024-09-11 DIAGNOSIS — Z992 Dependence on renal dialysis: Secondary | ICD-10-CM | POA: Insufficient documentation

## 2024-09-11 DIAGNOSIS — I132 Hypertensive heart and chronic kidney disease with heart failure and with stage 5 chronic kidney disease, or end stage renal disease: Secondary | ICD-10-CM

## 2024-09-11 DIAGNOSIS — Z952 Presence of prosthetic heart valve: Secondary | ICD-10-CM

## 2024-09-11 DIAGNOSIS — I509 Heart failure, unspecified: Secondary | ICD-10-CM | POA: Diagnosis not present

## 2024-09-11 HISTORY — PX: BASCILIC VEIN TRANSPOSITION: SHX5742

## 2024-09-11 LAB — POCT I-STAT, CHEM 8
BUN: 72 mg/dL — ABNORMAL HIGH (ref 8–23)
Calcium, Ion: 1.03 mmol/L — ABNORMAL LOW (ref 1.15–1.40)
Chloride: 104 mmol/L (ref 98–111)
Creatinine, Ser: 7.4 mg/dL — ABNORMAL HIGH (ref 0.44–1.00)
Glucose, Bld: 102 mg/dL — ABNORMAL HIGH (ref 70–99)
HCT: 34 % — ABNORMAL LOW (ref 36.0–46.0)
Hemoglobin: 11.6 g/dL — ABNORMAL LOW (ref 12.0–15.0)
Potassium: 4.5 mmol/L (ref 3.5–5.1)
Sodium: 137 mmol/L (ref 135–145)
TCO2: 26 mmol/L (ref 22–32)

## 2024-09-11 LAB — PROTIME-INR
INR: 1.3 — ABNORMAL HIGH (ref 0.8–1.2)
Prothrombin Time: 16.7 s — ABNORMAL HIGH (ref 11.4–15.2)

## 2024-09-11 SURGERY — TRANSPOSITION, VEIN, BASILIC
Anesthesia: General | Site: Arm Upper | Laterality: Left

## 2024-09-11 MED ORDER — CEFAZOLIN SODIUM-DEXTROSE 2-4 GM/100ML-% IV SOLN
INTRAVENOUS | Status: AC
  Filled 2024-09-11: qty 100

## 2024-09-11 MED ORDER — PHENYLEPHRINE HCL-NACL 20-0.9 MG/250ML-% IV SOLN
INTRAVENOUS | Status: DC | PRN
  Administered 2024-09-11: 50 ug/min via INTRAVENOUS

## 2024-09-11 MED ORDER — WARFARIN SODIUM 5 MG PO TABS: 2.5000 mg | ORAL_TABLET | ORAL | Status: DC

## 2024-09-11 MED ORDER — HEPARIN SODIUM (PORCINE) 1000 UNIT/ML IJ SOLN
INTRAMUSCULAR | Status: DC | PRN
Start: 2024-09-11 — End: 2024-09-11
  Administered 2024-09-11: 2000 [IU] via INTRAVENOUS

## 2024-09-11 MED ORDER — ORAL CARE MOUTH RINSE: 15.0000 mL | Freq: Once | OROMUCOSAL | Status: AC

## 2024-09-11 MED ORDER — PHENYLEPHRINE 80 MCG/ML (10ML) SYRINGE FOR IV PUSH (FOR BLOOD PRESSURE SUPPORT)
PREFILLED_SYRINGE | INTRAVENOUS | Status: DC | PRN
  Administered 2024-09-11: 160 ug via INTRAVENOUS

## 2024-09-11 MED ORDER — HEPARIN SODIUM (PORCINE) 1000 UNIT/ML IJ SOLN
INTRAMUSCULAR | Status: AC
  Filled 2024-09-11: qty 10

## 2024-09-11 MED ORDER — PHENYLEPHRINE 80 MCG/ML (10ML) SYRINGE FOR IV PUSH (FOR BLOOD PRESSURE SUPPORT)
PREFILLED_SYRINGE | INTRAVENOUS | Status: AC
Start: 2024-09-11 — End: 2024-09-11
  Filled 2024-09-11: qty 10

## 2024-09-11 MED ORDER — CEFAZOLIN SODIUM-DEXTROSE 2-4 GM/100ML-% IV SOLN
2.0000 g | INTRAVENOUS | Status: AC
  Administered 2024-09-11: 2 g via INTRAVENOUS

## 2024-09-11 MED ORDER — HEMOSTATIC AGENTS (NO CHARGE) OPTIME
TOPICAL | Status: DC | PRN
  Administered 2024-09-11: 4 via TOPICAL
  Administered 2024-09-11: 1 via TOPICAL

## 2024-09-11 MED ORDER — CHLORHEXIDINE GLUCONATE 0.12 % MT SOLN: 15.0000 mL | Freq: Once | OROMUCOSAL | Status: AC

## 2024-09-11 MED ORDER — HEPARIN 6000 UNIT IRRIGATION SOLUTION
Status: AC
  Filled 2024-09-11: qty 500

## 2024-09-11 MED ORDER — SODIUM CHLORIDE 0.9 % IV SOLN: INTRAVENOUS | Status: DC

## 2024-09-11 MED ORDER — ONDANSETRON HCL 4 MG/2ML IJ SOLN
INTRAMUSCULAR | Status: AC
  Filled 2024-09-11: qty 2

## 2024-09-11 MED ORDER — PROTAMINE SULFATE 10 MG/ML IV SOLN
INTRAVENOUS | Status: DC | PRN
Start: 2024-09-11 — End: 2024-09-11
  Administered 2024-09-11: 20 mg via INTRAVENOUS

## 2024-09-11 MED ORDER — LIDOCAINE 2% (20 MG/ML) 5 ML SYRINGE
INTRAMUSCULAR | Status: AC
  Filled 2024-09-11: qty 5

## 2024-09-11 MED ORDER — HEPARIN 6000 UNIT IRRIGATION SOLUTION
Status: DC | PRN
  Administered 2024-09-11: 1

## 2024-09-11 MED ORDER — CHLORHEXIDINE GLUCONATE 4 % EX SOLN
60.0000 mL | Freq: Once | CUTANEOUS | Status: DC
Start: 2024-09-11 — End: 2024-09-11

## 2024-09-11 MED ORDER — PROTAMINE SULFATE 10 MG/ML IV SOLN
INTRAVENOUS | Status: AC
  Filled 2024-09-11: qty 5

## 2024-09-11 MED ORDER — DEXAMETHASONE SOD PHOSPHATE PF 10 MG/ML IJ SOLN
INTRAMUSCULAR | Status: DC | PRN
  Administered 2024-09-11: 5 mg via INTRAVENOUS

## 2024-09-11 MED ORDER — CHLORHEXIDINE GLUCONATE 4 % EX SOLN: 60.0000 mL | Freq: Once | CUTANEOUS | Status: DC

## 2024-09-11 MED ORDER — 0.9 % SODIUM CHLORIDE (POUR BTL) OPTIME
TOPICAL | Status: DC | PRN
  Administered 2024-09-11: 1000 mL

## 2024-09-11 MED ORDER — CHLORHEXIDINE GLUCONATE 0.12 % MT SOLN
OROMUCOSAL | Status: AC
  Administered 2024-09-11: 15 mL via OROMUCOSAL
  Filled 2024-09-11: qty 15

## 2024-09-11 MED ORDER — LIDOCAINE 2% (20 MG/ML) 5 ML SYRINGE
INTRAMUSCULAR | Status: DC | PRN
  Administered 2024-09-11: 100 mg via INTRAVENOUS

## 2024-09-11 MED ORDER — PROPOFOL 10 MG/ML IV BOLUS
INTRAVENOUS | Status: DC | PRN
  Administered 2024-09-11: 100 mg via INTRAVENOUS

## 2024-09-11 MED ORDER — SODIUM CHLORIDE 0.9 % IV SOLN
20.0000 ug | Freq: Once | INTRAVENOUS | Status: AC
  Administered 2024-09-11: 20 ug via INTRAVENOUS
  Filled 2024-09-11: qty 5

## 2024-09-11 MED ORDER — FENTANYL CITRATE (PF) 250 MCG/5ML IJ SOLN
INTRAMUSCULAR | Status: AC
  Filled 2024-09-11: qty 5

## 2024-09-11 MED ORDER — FENTANYL CITRATE (PF) 250 MCG/5ML IJ SOLN
INTRAMUSCULAR | Status: DC | PRN
Start: 2024-09-11 — End: 2024-09-11
  Administered 2024-09-11 (×2): 25 ug via INTRAVENOUS
  Administered 2024-09-11: 50 ug via INTRAVENOUS

## 2024-09-11 MED ORDER — ONDANSETRON HCL 4 MG/2ML IJ SOLN
INTRAMUSCULAR | Status: DC | PRN
  Administered 2024-09-11: 4 mg via INTRAVENOUS

## 2024-09-11 MED ORDER — PROPOFOL 10 MG/ML IV BOLUS
INTRAVENOUS | Status: AC
Start: 2024-09-11 — End: 2024-09-11
  Filled 2024-09-11: qty 20

## 2024-09-11 SURGICAL SUPPLY — 39 items
ARMBAND PINK RESTRICT EXTREMIT (MISCELLANEOUS) ×2 IMPLANT
BAG COUNTER SPONGE SURGICOUNT (BAG) ×2 IMPLANT
BNDG ELASTIC 4INX 5YD STR LF (GAUZE/BANDAGES/DRESSINGS) IMPLANT
BNDG ELASTIC 4X5.8 VLCR STR LF (GAUZE/BANDAGES/DRESSINGS) ×2 IMPLANT
BNDG ELASTIC 6X10 VLCR STRL LF (GAUZE/BANDAGES/DRESSINGS) IMPLANT
CANISTER SUCTION 3000ML PPV (SUCTIONS) ×2 IMPLANT
CLIP TI MEDIUM 24 (CLIP) ×2 IMPLANT
CLIP TI MEDIUM 6 (CLIP) ×2 IMPLANT
CLIP TI WIDE RED SMALL 24 (CLIP) ×2 IMPLANT
CLIP TI WIDE RED SMALL 6 (CLIP) ×2 IMPLANT
COVER PROBE W GEL 5X96 (DRAPES) ×2 IMPLANT
DERMABOND ADVANCED .7 DNX12 (GAUZE/BANDAGES/DRESSINGS) ×2 IMPLANT
ELECTRODE REM PT RTRN 9FT ADLT (ELECTROSURGICAL) ×2 IMPLANT
GLOVE BIOGEL PI IND STRL 8 (GLOVE) ×2 IMPLANT
GOWN STRL REUS W/ TWL LRG LVL3 (GOWN DISPOSABLE) ×4 IMPLANT
GOWN STRL REUS W/TWL 2XL LVL3 (GOWN DISPOSABLE) ×4 IMPLANT
HEMOSTAT SNOW SURGICEL 2X4 (HEMOSTASIS) IMPLANT
KIT BASIN OR (CUSTOM PROCEDURE TRAY) ×2 IMPLANT
KIT TURNOVER KIT B (KITS) ×2 IMPLANT
NDL HYPO 25GX1X1/2 BEV (NEEDLE) ×2 IMPLANT
NEEDLE HYPO 25GX1X1/2 BEV (NEEDLE) ×1 IMPLANT
PACK CV ACCESS (CUSTOM PROCEDURE TRAY) ×2 IMPLANT
PAD ARMBOARD POSITIONER FOAM (MISCELLANEOUS) ×4 IMPLANT
POWDER SURGICEL 3.0 GRAM (HEMOSTASIS) IMPLANT
SLING ARM FOAM STRAP LRG (SOFTGOODS) IMPLANT
SOLN 0.9% NACL 1000 ML (IV SOLUTION) ×1 IMPLANT
SOLN 0.9% NACL POUR BTL 1000ML (IV SOLUTION) ×2 IMPLANT
SOLN STERILE WATER 1000 ML (IV SOLUTION) ×1 IMPLANT
SOLN STERILE WATER BTL 1000 ML (IV SOLUTION) ×2 IMPLANT
SPIKE FLUID TRANSFER (MISCELLANEOUS) ×2 IMPLANT
SUT MNCRL AB 4-0 PS2 18 (SUTURE) ×2 IMPLANT
SUT PROLENE 6 0 BV (SUTURE) ×2 IMPLANT
SUT PROLENE 7 0 BV 1 (SUTURE) IMPLANT
SUT SILK 2 0 SH (SUTURE) IMPLANT
SUT SILK 2 0 SH CR/8 (SUTURE) ×2 IMPLANT
SUT VIC AB 2-0 CT1 TAPERPNT 27 (SUTURE) ×2 IMPLANT
SUT VIC AB 3-0 SH 27X BRD (SUTURE) ×4 IMPLANT
TOWEL GREEN STERILE (TOWEL DISPOSABLE) ×2 IMPLANT
UNDERPAD 30X36 HEAVY ABSORB (UNDERPADS AND DIAPERS) ×2 IMPLANT

## 2024-09-11 NOTE — Op Note (Signed)
    NAME: [name]    MRN: [id number] DOB: 11-16-1951    DATE OF OPERATION: 09/11/2024  PREOP DIAGNOSIS:    End stage renal disease in need of long term HD access.   POSTOP DIAGNOSIS:    Same  PROCEDURE:    Left arm brachiobasilic fistula revision, branch ligation, transposition   SURGEON: Fonda FORBES Rim  ASSIST: Sherrilee Holster, PA  ANESTHESIA: General   EBL: 50ml  INDICATIONS:    Vanessa [name] is a 73 y.o. female with end-stage renal disease having previously undergone left sided brachiobasilic fistula creation.  She presents today for superficialization.  The fistula has adequate flow, but is too deep.  FINDINGS:   Tapering left arm brachiobasilic fistula.  3 mm to 6 mm in size The fistula was small in the antecubital fossa and the vein was fragile, thin-walled.  TECHNIQUE:   Patient was brought to the OR and laid in supine position.  Moderate anesthesia was induced. The patient was prepped and draped in standard fashion.  Lidocaine  was brought to the field and a local block was performed.   The case began with left arm ultrasound fistula mapping.  Multiple branches were noted and marked.  Three longitudinal skip incisions were made along the course of the basilic vein with 5 cm bridges.  This was carried through the subcutaneous fat to the brachiobasilic fistula.  The fistula was mobilized and multiple branches ligated using 2-0 silk and clips.  Once mobilized, a gore tunneler was brought on to the field, and a subcutaneous tunnel was made along the medial aspect of the bicep. Next, the patient as heparinized, fistula marked, clamped and transected. The vein was pulled through the tunnel tract and reanastomosed using 6.0 prolene suture in running fashion. The wound bed was irrigated with saline, hemostasis achieved with suture and cautery. The wounds were closed with layers of vicryl suture with monocryl and dermabond at the skin. There was a palpable thrill in the  fistula at case completion with a pulse in the wrist.     Given the complexity of the case,  the assistant was necessary in order to expedient the procedure and safely perform the technical aspects of the operation.  The assistant provided traction and countertraction to assist with exposure of the artery and vein.  They also assisted with suture ligation of multiple venous branches.  They played a critical role in the anastomosis. These skills, especially following the Prolene suture for the anastomosis, could not have been adequately performed by a scrub tech assistant.   Fonda FORBES Rim, MD [facility] DATE OF DICTATION:   09/11/2024

## 2024-09-11 NOTE — Discharge Instructions (Signed)
   [facility]  Discharge Instructions  AV Fistula or Graft Surgery for Dialysis Access  Please refer to the following instructions for your post-procedure care. Your surgeon or physician assistant will discuss any changes with you.  Activity  You may drive the day following your surgery, if you are comfortable and no longer taking prescription pain medication. Resume full activity as the soreness in your incision resolves.  Bathing/Showering  You may shower after you go home. Keep your incision dry for 48 hours. Do not soak in a bathtub, hot tub, or swim until the incision heals completely. You may not shower if you have a hemodialysis catheter.  Incision Care  Clean your incision with mild soap and water  after 48 hours. Pat the area dry with a clean towel. You do not need a bandage unless otherwise instructed. Do not apply any ointments or creams to your incision. You may have skin glue on your incision. Do not peel it off. It will come off on its own in about one week. Your arm may swell a bit after surgery. To reduce swelling use pillows to elevate your arm so it is above your heart. Your doctor will tell you if you need to lightly wrap your arm with an ACE bandage.  Diet  Resume your normal diet. There are not special food restrictions following this procedure. In order to heal from your surgery, it is CRITICAL to get adequate nutrition. Your body requires vitamins, minerals, and protein. Vegetables are the best source of vitamins and minerals. Vegetables also provide the perfect balance of protein. Processed food has little nutritional value, so try to avoid this.  Medications  Resume taking all of your medications. If your incision is causing pain, you may take over-the counter pain relievers such as acetaminophen  (Tylenol ). If you were prescribed a stronger pain medication, please be aware these medications can cause nausea and constipation. Prevent  nausea by taking the medication with a snack or meal. Avoid constipation by drinking plenty of fluids and eating foods with high amount of fiber, such as fruits, vegetables, and grains.  Do not take Tylenol  if you are taking prescription pain medications.  Follow up Your surgeon may want to see you in the office following your access surgery. If so, this will be arranged at the time of your surgery.  Please call us  immediately for any of the following conditions:  Increased pain, redness, drainage (pus) from your incision site Fever of 101 degrees or higher Severe or worsening pain at your incision site Hand pain or numbness.  Reduce your risk of vascular disease:  Stop smoking. If you would like help, call [facility] at 1-800-QUIT-NOW ((509)207-4534) or  at 5302283450  Manage your cholesterol Maintain a desired weight Control your diabetes Keep your blood pressure down  Dialysis  It will take several weeks to several months for your new dialysis access to be ready for use. Your surgeon will determine when it is okay to use it. Your nephrologist will continue to direct your dialysis. You can continue to use your Permcath until your new access is ready for use.   09/11/2024 Vanessa [name] [id number] 15-Apr-1951  Surgeon(s): Lanis Fonda BRAVO, MD  Procedure(s): TRANSPOSITION, VEIN, BASILIC   May stick graft immediately   May stick graft on designated area only:   x Do not stick fistula for 8 weeks    If you have any questions, please call the office at [phone].

## 2024-09-11 NOTE — Anesthesia Procedure Notes (Signed)
 Procedure Name: LMA Insertion Date/Time: 09/11/2024 10:11 AM  Performed by: Summar Mcglothlin A, CRNAPre-anesthesia Checklist: Patient identified, Emergency Drugs available, Suction available and Patient being monitored Patient Re-evaluated:Patient Re-evaluated prior to induction Oxygen Delivery Method: Circle System Utilized Preoxygenation: Pre-oxygenation with 100% oxygen Induction Type: IV induction Ventilation: Mask ventilation without difficulty LMA: LMA inserted LMA Size: 4.0 Number of attempts: 1 Airway Equipment and Method: Bite block Placement Confirmation: positive ETCO2 Tube secured with: Tape Dental Injury: Teeth and Oropharynx as per pre-operative assessment

## 2024-09-11 NOTE — Progress Notes (Signed)
 Office Note   Patient seen and examined in preop holding.  No complaints. No changes to medication history or physical exam since last seen in clinic. After discussing the risks and benefits of left arm fistula transposition, Vanessa [name] elected to proceed.   Vanessa [name] Rim MD   CC:  ESRD Requesting Provider:  No ref. provider found  HPI: Vanessa [name] is a Right handed 73 y.o. (11-16-1951) female with kidney disease who presents status post left arm brachiobasilic fistula creation at the end of August.  A native of [place] , she moved to [place] in 1975 with her husband.  She has worked several jobs, including with the [employer] system for a number of years.  Now retired, she continues to live independently.    On exam, Vanessa [name] was doing well.  She had no complaints.  She denies symptoms of steal.  Notes that some itchiness at the level of the left wrist. Current access is right internal jugular TDC. Dialysis days are MWF.      Past Medical History:  Diagnosis Date   CHF (congestive heart failure) (HCC)    Dyspnea    occasional w/exertion   ESRD on hemodialysis (HCC)    dialysis Mon Wed Fri   Heart murmur    Hypertension    Moderate mitral regurgitation    moderate to severe MR with moderate pulmonary HTN   Multinodular goiter    Nonischemic cardiomyopathy (HCC)    EF 30-35% by echo 2015   Pulmonary HTN (HCC)    moderate with PASP by echo 2015   PVC's (premature ventricular contractions)     Past Surgical History:  Procedure Laterality Date   CARDIAC CATHETERIZATION  2003   normal coronary arteries   CARDIAC CATHETERIZATION  2013  Nov   h/o nonischemic DCM EF 35-40% increase LVEDP    EXPLORATION POST OPERATIVE OPEN HEART N/A 11/29/2023   Procedure: EXPLORATION POST OPERATIVE OPEN HEART;  Surgeon: Vanessa Mt, MD;  Location: [facility];  Service: Open Heart Surgery;  Laterality: N/A;   HYSTEROSCOPY WITH D & C  12/02/2012    Procedure: DILATATION AND CURETTAGE /HYSTEROSCOPY;  Surgeon: Vanessa PARAS. Rosalva, MD;  Location: [facility] ORS;  Service: Gynecology;  Laterality: N/A;  cervical polypectomy   INSERTION OF ARTERIOVENOUS (AV) ARTEGRAFT ARM Left 07/25/2024   Procedure: CREATION OF BRACHIOBASILLIC ARTERIOVENOUS FISTULA;  Surgeon: [name] Vanessa FORBES, MD;  Location: [facility] OR;  Service: Vascular;  Laterality: Left;   IR FLUORO GUIDE CV LINE LEFT  12/24/2023   IR FLUORO GUIDE CV LINE RIGHT  12/24/2023   IR REMOVAL TUN CV CATH W/O FL  01/20/2024   IR US  GUIDE VASC ACCESS LEFT  12/24/2023   IR US  GUIDE VASC ACCESS RIGHT  12/24/2023   LEFT AND RIGHT HEART CATHETERIZATION WITH CORONARY ANGIOGRAM N/A 02/15/2015   Procedure: LEFT AND RIGHT HEART CATHETERIZATION WITH CORONARY ANGIOGRAM;  Surgeon: Vanessa [name] Reek, MD;  Location: [facility] CATH LAB;  Service: Cardiovascular;  Laterality: N/A;   MITRAL VALVE REPLACEMENT N/A 11/29/2023   Procedure: MITRAL VALVE (MV) REPLACEMENT USING MOSAIC 310CINCH TISSUE VALVE;  Surgeon: Vanessa Mt, MD;  Location: [facility] OR;  Service: Open Heart Surgery;  Laterality: N/A;   RIGHT HEART CATH N/A 12/17/2023   Procedure: RIGHT HEART CATH;  Surgeon: Vanessa Ezra GORMAN, MD;  Location: [facility] INVASIVE CV LAB;  Service: Cardiovascular;  Laterality: N/A;   RIGHT/LEFT HEART CATH AND CORONARY ANGIOGRAPHY N/A 10/12/2023   Procedure: RIGHT/LEFT HEART CATH AND  CORONARY ANGIOGRAPHY;  Surgeon: Vanessa Ezra RAMAN, MD;  Location: [facility] INVASIVE CV LAB;  Service: Cardiovascular;  Laterality: N/A;   TEE WITHOUT CARDIOVERSION N/A 10/11/2018   Procedure: TRANSESOPHAGEAL ECHOCARDIOGRAM (TEE);  Surgeon: Vanessa Ezra RAMAN, MD;  Location: [facility] ENDOSCOPY;  Service: Cardiovascular;  Laterality: N/A;   TEE WITHOUT CARDIOVERSION N/A 09/07/2023   Procedure: TRANSESOPHAGEAL ECHOCARDIOGRAM;  Surgeon: Vanessa Ezra RAMAN, MD;  Location: [facility] INVASIVE CV LAB;  Service: Cardiovascular;  Laterality: N/A;   TEE WITHOUT CARDIOVERSION N/A 11/29/2023   Procedure: TRANSESOPHAGEAL  ECHOCARDIOGRAM;  Surgeon: Vanessa Mt, MD;  Location: [facility] OR;  Service: Open Heart Surgery;  Laterality: N/A;   TRANSCATHETER MITRAL EDGE TO EDGE REPAIR N/A 12/14/2018   Procedure: MITRAL VALVE REPAIR;  Surgeon: Vanessa Sharper, MD;  Location: [facility] INVASIVE CV LAB;  Service: Cardiovascular;  Laterality: N/A;   TRICUSPID VALVE REPLACEMENT N/A 11/29/2023   Procedure: TRICUSPID VALVE REPAIR USING MC3 EDWARDS TRICUSPID ANNULOPLASTY RING;  Surgeon: Vanessa Mt, MD;  Location: [facility] OR;  Service: Open Heart Surgery;  Laterality: N/A;    Social History   Socioeconomic History   Marital status: Single    Spouse name: Not on file   Number of children: Not on file   Years of education: Not on file   Highest education level: Not on file  Occupational History   Occupation: Retired    Comment: Textile, [employer]  Tobacco Use   Smoking status: Never   Smokeless tobacco: Never  Vaping Use   Vaping status: Never Used  Substance and Sexual Activity   Alcohol use: No    Alcohol/week: 0.0 standard drinks of alcohol   Drug use: No   Sexual activity: Not Currently    Birth control/protection: Post-menopausal  Other Topics Concern   Not on file  Social History Narrative   Not on file   Social Drivers of Health   Financial Resource Strain: Not on file  Food Insecurity: No Food Insecurity (02/08/2024)   Hunger Vital Sign    Worried About Running Out of Food in the Last Year: Never true    Ran Out of Food in the Last Year: Never true  Transportation Needs: No Transportation Needs (02/08/2024)   PRAPARE - Administrator, Civil Service (Medical): No    Lack of Transportation (Non-Medical): No  Physical Activity: Not on file  Stress: Not on file  Social Connections: Socially Isolated (02/08/2024)   Social Connection and Isolation Panel    Frequency of Communication with Friends and Family: Twice a week    Frequency of Social Gatherings with Friends and Family: Never     Attends Religious Services: Never    Database administrator or Organizations: No    Attends Banker Meetings: Never    Marital Status: Separated  Intimate Partner Violence: Not At Risk (02/08/2024)   Humiliation, Afraid, Rape, and Kick questionnaire    Fear of Current or Ex-Partner: No    Emotionally Abused: No    Physically Abused: No    Sexually Abused: No   Family History  Problem Relation Age of Onset   Breast cancer Mother 74   Heart disease Father    Breast cancer Sister 72   Colon cancer Neg Hx    Esophageal cancer Neg Hx    Rectal cancer Neg Hx    Stomach cancer Neg Hx    Thyroid  disease Neg Hx     Current Facility-Administered Medications  Medication Dose Route Frequency Provider Last Rate  Last Admin   0.9 %  sodium chloride  infusion   Intravenous Continuous Vanessa Wahlquist E, MD       0.9 %  sodium chloride  infusion   Intravenous Continuous Vanessa [name], Vanessa L, MD       ceFAZolin  (ANCEF ) 2-4 GM/100ML-% IVPB            ceFAZolin  (ANCEF ) IVPB 2g/100 mL premix  2 g Intravenous 30 min Pre-Op  Vanessa Steinke E, MD       chlorhexidine  (HIBICLENS ) 4 % liquid 4 Application  60 mL Topical Once Vanessa [name] Hoeger E, MD       And   chlorhexidine  (HIBICLENS ) 4 % liquid 4 Application  60 mL Topical Once Gannon Heinzman E, MD        Allergies  Allergen Reactions   Spironolactone      hyperkalemia     REVIEW OF SYSTEMS:  [X]  denotes positive finding, [ ]  denotes negative finding Cardiac  Comments:  Chest pain or chest pressure:    Shortness of breath upon exertion:    Short of breath when lying flat:    Irregular heart rhythm:        Vascular    Pain in calf, thigh, or hip brought on by ambulation:    Pain in feet at night that wakes you up from your sleep:     Blood clot in your veins:    Leg swelling:         Pulmonary    Oxygen at home:    Productive cough:     Wheezing:         Neurologic    Sudden weakness in arms or legs:     Sudden numbness in arms  or legs:     Sudden onset of difficulty speaking or slurred speech:    Temporary loss of vision in one eye:     Problems with dizziness:         Gastrointestinal    Blood in stool:     Vomited blood:         Genitourinary    Burning when urinating:     Blood in urine:        Psychiatric    Major depression:         Hematologic    Bleeding problems:    Problems with blood clotting too easily:        Skin    Rashes or ulcers:        Constitutional    Fever or chills:      PHYSICAL EXAMINATION:  Vitals:   09/08/24 1347 09/11/24 0721  BP:  (!) 149/86  Pulse:  73  Resp:  18  Temp:  98.1 F (36.7 C)  TempSrc:  Oral  SpO2:  92%  Weight: 69.5 kg 68.9 kg  Height: 5' 3 (1.6 m) 5' 3 (1.6 m)     General:  WDWN in NAD; vital signs documented above Gait: Not observed HENT: WNL, normocephalic Pulmonary: normal non-labored breathing , without Rales, rhonchi,  wheezing Cardiac: regular HR, Abdomen: soft, NT, no masses Skin: without rashes Vascular Exam/Pulses:  Right Left  Radial 2+ (normal) 1+ (normal)                       Extremities: without ischemic changes, without Gangrene , without cellulitis; without open wounds;  Small hematoma present.  No skin breakdown Musculoskeletal: no muscle wasting or atrophy  Neurologic: A&O X 3;  No focal weakness or  paresthesias are detected Psychiatric:  The pt has Normal affect.   Non-Invasive Vascular Imaging:   See studies    ASSESSMENT/PLAN:  ALVINA STROTHER is a 73 y.o. female who presents status post left arm brachiobasilic fistula creation.  This fistula has an excellent thrill.  [name] evaluated the ultrasound demonstrating adequate flow, near-adequate diameter.  The vessel is deep.  Vanessa [name] had a long discussion regarding left arm brachiobasilic fistula transposition.  After discussing risks and benefits, she elected to proceed.  We will schedule this in the next 2 to 3 weeks to allow for some continued maturation  prior to the operation.  Vanessa [name] Rim, MD [facility] [phone]

## 2024-09-11 NOTE — H&P (Signed)
 Office Note   Patient seen and examined in preop holding.  No complaints. No changes to medication history or physical exam since last seen in clinic. After discussing the risks and benefits of left arm fistula transposition, Vanessa [name] elected to proceed.   Vanessa [name]   CC:  ESRD Requesting Provider:  No ref. provider found  HPI: Vanessa [name] is a Right handed 73 y.o. ([birth year]) female with kidney disease who presents status post left arm brachiobasilic fistula creation at the end of August.  A native of [place] , she moved to [place] in 1975 with her husband.  She has worked several jobs, including with the [employer] system for a number of years.  Now retired, she continues to live independently.    On exam, Vanessa [name] was doing well.  She had no complaints.  She denies symptoms of steal.  Notes that some itchiness at the level of the left wrist. Current access is right internal jugular TDC. Dialysis days are MWF.      Past Medical History:  Diagnosis Date   CHF (congestive heart failure) (HCC)    Dyspnea    occasional w/exertion   ESRD on hemodialysis (HCC)    dialysis Mon Wed Fri   Heart murmur    Hypertension    Moderate mitral regurgitation    moderate to severe MR with moderate pulmonary HTN   Multinodular goiter    Nonischemic cardiomyopathy (HCC)    EF 30-35% by echo 2015   Pulmonary HTN (HCC)    moderate with PASP by echo 2015   PVC's (premature ventricular contractions)     Past Surgical History:  Procedure Laterality Date   CARDIAC CATHETERIZATION  2003   normal coronary arteries   CARDIAC CATHETERIZATION  2013  Nov   h/o nonischemic DCM EF 35-40% increase LVEDP    EXPLORATION POST OPERATIVE OPEN HEART N/A 11/29/2023   Procedure: EXPLORATION POST OPERATIVE OPEN HEART;  Surgeon: Maryjane Mt, [name];  Location: [facility] OR;  Service: Open Heart Surgery;  Laterality: N/A;   HYSTEROSCOPY WITH D & C  12/02/2012    Procedure: DILATATION AND CURETTAGE /HYSTEROSCOPY;  Surgeon: Rexene PARAS. Rosalva, [name];  Location: [facility] ORS;  Service: Gynecology;  Laterality: N/A;  cervical polypectomy   INSERTION OF ARTERIOVENOUS (AV) ARTEGRAFT ARM Left 07/25/2024   Procedure: CREATION OF BRACHIOBASILLIC ARTERIOVENOUS FISTULA;  Surgeon: Rim Vanessa FORBES, [name];  Location: [facility] OR;  Service: Vascular;  Laterality: Left;   IR FLUORO GUIDE CV LINE LEFT  12/24/2023   IR FLUORO GUIDE CV LINE RIGHT  12/24/2023   IR REMOVAL TUN CV CATH W/O FL  01/20/2024   IR US  GUIDE VASC ACCESS LEFT  12/24/2023   IR US  GUIDE VASC ACCESS RIGHT  12/24/2023   LEFT AND RIGHT HEART CATHETERIZATION WITH CORONARY ANGIOGRAM N/A 02/15/2015   Procedure: LEFT AND RIGHT HEART CATHETERIZATION WITH CORONARY ANGIOGRAM;  Surgeon: Candyce GORMAN Reek, [name];  Location: [facility] CATH LAB;  Service: Cardiovascular;  Laterality: N/A;   MITRAL VALVE REPLACEMENT N/A 11/29/2023   Procedure: MITRAL VALVE (MV) REPLACEMENT USING MOSAIC 310CINCH TISSUE VALVE;  Surgeon: Maryjane Mt, [name];  Location: [facility] OR;  Service: Open Heart Surgery;  Laterality: N/A;   RIGHT HEART CATH N/A 12/17/2023   Procedure: RIGHT HEART CATH;  Surgeon: Rolan Ezra GORMAN, [name];  Location: [facility] INVASIVE CV LAB;  Service: Cardiovascular;  Laterality: N/A;   RIGHT/LEFT HEART CATH AND CORONARY ANGIOGRAPHY N/A 10/12/2023   Procedure: RIGHT/LEFT HEART CATH AND  CORONARY ANGIOGRAPHY;  Surgeon: Rolan Ezra RAMAN, [name];  Location: [facility] INVASIVE CV LAB;  Service: Cardiovascular;  Laterality: N/A;   TEE WITHOUT CARDIOVERSION N/A 10/11/2018   Procedure: TRANSESOPHAGEAL ECHOCARDIOGRAM (TEE);  Surgeon: Rolan Ezra RAMAN, [name];  Location: [facility] ENDOSCOPY;  Service: Cardiovascular;  Laterality: N/A;   TEE WITHOUT CARDIOVERSION N/A 09/07/2023   Procedure: TRANSESOPHAGEAL ECHOCARDIOGRAM;  Surgeon: Rolan Ezra RAMAN, [name];  Location: [facility] INVASIVE CV LAB;  Service: Cardiovascular;  Laterality: N/A;   TEE WITHOUT CARDIOVERSION N/A 11/29/2023   Procedure: TRANSESOPHAGEAL  ECHOCARDIOGRAM;  Surgeon: Maryjane Mt, [name];  Location: [facility] OR;  Service: Open Heart Surgery;  Laterality: N/A;   TRANSCATHETER MITRAL EDGE TO EDGE REPAIR N/A 12/14/2018   Procedure: MITRAL VALVE REPAIR;  Surgeon: Wonda Sharper, [name];  Location: [facility] INVASIVE CV LAB;  Service: Cardiovascular;  Laterality: N/A;   TRICUSPID VALVE REPLACEMENT N/A 11/29/2023   Procedure: TRICUSPID VALVE REPAIR USING MC3 EDWARDS TRICUSPID ANNULOPLASTY RING;  Surgeon: Maryjane Mt, [name];  Location: [facility] OR;  Service: Open Heart Surgery;  Laterality: N/A;    Social History   Socioeconomic History   Marital status: Single    Spouse name: Not on file   Number of children: Not on file   Years of education: Not on file   Highest education level: Not on file  Occupational History   Occupation: Retired    Comment: Textile, [employer]  Tobacco Use   Smoking status: Never   Smokeless tobacco: Never  Vaping Use   Vaping status: Never Used  Substance and Sexual Activity   Alcohol use: No    Alcohol/week: 0.0 standard drinks of alcohol   Drug use: No   Sexual activity: Not Currently    Birth control/protection: Post-menopausal  Other Topics Concern   Not on file  Social History Narrative   Not on file   Social Drivers of Health   Financial Resource Strain: Not on file  Food Insecurity: No Food Insecurity (02/08/2024)   Hunger Vital Sign    Worried About Running Out of Food in the Last Year: Never true    Ran Out of Food in the Last Year: Never true  Transportation Needs: No Transportation Needs (02/08/2024)   PRAPARE - Administrator, Civil Service (Medical): No    Lack of Transportation (Non-Medical): No  Physical Activity: Not on file  Stress: Not on file  Social Connections: Socially Isolated (02/08/2024)   Social Connection and Isolation Panel    Frequency of Communication with Friends and Family: Twice a week    Frequency of Social Gatherings with Friends and Family: Never     Attends Religious Services: Never    Database administrator or Organizations: No    Attends Banker Meetings: Never    Marital Status: Separated  Intimate Partner Violence: Not At Risk (02/08/2024)   Humiliation, Afraid, Rape, and Kick questionnaire    Fear of Current or Ex-Partner: No    Emotionally Abused: No    Physically Abused: No    Sexually Abused: No   Family History  Problem Relation Age of Onset   Breast cancer Mother 63   Heart disease Father    Breast cancer Sister 36   Colon cancer Neg Hx    Esophageal cancer Neg Hx    Rectal cancer Neg Hx    Stomach cancer Neg Hx    Thyroid  disease Neg Hx     Current Facility-Administered Medications  Medication Dose Route Frequency Provider Last Rate  Last Admin   0.9 %  sodium chloride  infusion   Intravenous Continuous Dandra Shambaugh E, [name]       0.9 %  sodium chloride  infusion   Intravenous Continuous Niels Marien CROME, [name]   New Bag at 09/11/24 0954   0.9 % irrigation (POUR BTL)    PRN Lanis Vanessa BRAVO, [name]   1,000 mL at 09/11/24 1048   chlorhexidine  (HIBICLENS ) 4 % liquid 4 Application  60 mL Topical Once Citlalli Weikel E, [name]       And   chlorhexidine  (HIBICLENS ) 4 % liquid 4 Application  60 mL Topical Once Marcin Holte E, [name]       hemostatic agents (no charge) Optime    PRN Lanis Vanessa BRAVO, [name]   1 Application at 09/11/24 1146   heparin  6000 units / NS 500 mL irrigation    PRN Diana Davenport E, [name]   1 Application at 09/11/24 1049   Facility-Administered Medications Ordered in Other Encounters  Medication Dose Route Frequency Provider Last Rate Last Admin   dexamethasone  (DECADRON ) injection   Intravenous Anesthesia Intra-op Salnikova, Evgenia A, CRNA   5 mg at 09/11/24 1017   fentaNYL  citrate (PF) (SUBLIMAZE ) injection   Intravenous Anesthesia Intra-op Salnikova, Evgenia A, CRNA   50 mcg at 09/11/24 1151   heparin  sodium (porcine) injection   Intravenous Anesthesia Intra-op Salnikova, Evgenia A, CRNA   2,000 Units  at 09/11/24 1057   lidocaine  2% (20 mg/mL) 5 mL syringe   Intravenous Anesthesia Intra-op Salnikova, Evgenia A, CRNA   100 mg at 09/11/24 1008   ondansetron  (ZOFRAN ) injection   Intravenous Anesthesia Intra-op Salnikova, Evgenia A, CRNA   4 mg at 09/11/24 1017   phenylephrine  (NEO-SYNEPHRINE) 20mg /NS premix infusion   Intravenous Continuous PRN Salnikova, Evgenia A, CRNA   Stopped at 09/11/24 1220   PHENYLephrine  80 mcg/ml in normal saline Adult IV Push Syringe (For Blood Pressure Support)   Intravenous Anesthesia Intra-op Salnikova, Evgenia A, CRNA   160 mcg at 09/11/24 1008   propofol  (DIPRIVAN ) 10 mg/mL bolus/IV push   Intravenous Anesthesia Intra-op Salnikova, Evgenia A, CRNA   100 mg at 09/11/24 1008   protamine  injection   Intravenous Anesthesia Intra-op Salnikova, Evgenia A, CRNA   20 mg at 09/11/24 1122    Allergies  Allergen Reactions   Spironolactone      hyperkalemia     REVIEW OF SYSTEMS:  [X]  denotes positive finding, [ ]  denotes negative finding Cardiac  Comments:  Chest pain or chest pressure:    Shortness of breath upon exertion:    Short of breath when lying flat:    Irregular heart rhythm:        Vascular    Pain in calf, thigh, or hip brought on by ambulation:    Pain in feet at night that wakes you up from your sleep:     Blood clot in your veins:    Leg swelling:         Pulmonary    Oxygen at home:    Productive cough:     Wheezing:         Neurologic    Sudden weakness in arms or legs:     Sudden numbness in arms or legs:     Sudden onset of difficulty speaking or slurred speech:    Temporary loss of vision in one eye:     Problems with dizziness:         Gastrointestinal    Blood  in stool:     Vomited blood:         Genitourinary    Burning when urinating:     Blood in urine:        Psychiatric    Major depression:         Hematologic    Bleeding problems:    Problems with blood clotting too easily:        Skin    Rashes or  ulcers:        Constitutional    Fever or chills:      PHYSICAL EXAMINATION:  Vitals:   09/08/24 1347 09/11/24 0721  BP:  (!) 149/86  Pulse:  73  Resp:  18  Temp:  98.1 F (36.7 C)  TempSrc:  Oral  SpO2:  92%  Weight: 69.5 kg 68.9 kg  Height: 5' 3 (1.6 m) 5' 3 (1.6 m)     General:  WDWN in NAD; vital signs documented above Gait: Not observed HENT: WNL, normocephalic Pulmonary: normal non-labored breathing , without Rales, rhonchi,  wheezing Cardiac: regular HR, Abdomen: soft, NT, no masses Skin: without rashes Vascular Exam/Pulses:  Right Left  Radial 2+ (normal) 1+ (normal)                       Extremities: without ischemic changes, without Gangrene , without cellulitis; without open wounds;  Small hematoma present.  No skin breakdown Musculoskeletal: no muscle wasting or atrophy  Neurologic: A&O X 3;  No focal weakness or paresthesias are detected Psychiatric:  The pt has Normal affect.   Non-Invasive Vascular Imaging:   See studies    ASSESSMENT/PLAN:  Vanessa [name] is a 73 y.o. female who presents status post left arm brachiobasilic fistula creation.  This fistula has an excellent thrill.  I evaluated the ultrasound demonstrating adequate flow, near-adequate diameter.  The vessel is deep.  Calvary I had a long discussion regarding left arm brachiobasilic fistula transposition.  After discussing risks and benefits, she elected to proceed.  We will schedule this in the next 2 to 3 weeks to allow for some continued maturation prior to the operation.  Vanessa FORBES Rim, [name] [facility] [phone]

## 2024-09-11 NOTE — Anesthesia Postprocedure Evaluation (Signed)
 Anesthesia Post Note  [name]  Procedure(s) Performed: TRANSPOSITION, VEIN, BASILIC (Left: Arm Upper)     Patient location during evaluation: PACU Anesthesia Type: General Level of consciousness: awake and alert Pain management: pain level controlled Vital Signs Assessment: post-procedure vital signs reviewed and stable Respiratory status: spontaneous breathing, nonlabored ventilation, respiratory function stable and patient connected to nasal cannula oxygen Cardiovascular status: blood pressure returned to baseline and stable Postop Assessment: no apparent nausea or vomiting Anesthetic complications: no   No notable events documented.  Last Vitals:  Vitals:   09/11/24 1245 09/11/24 1300  BP: 109/67 107/60  Pulse: 66 65  Resp: (!) 22 (!) 25  Temp:  37.1 C  SpO2: 96% 96%    Last Pain:  Vitals:   09/11/24 1300  TempSrc:   PainSc: 2                  Elwanda Moger L Mitul Hallowell

## 2024-09-11 NOTE — Transfer of Care (Signed)
 Immediate Anesthesia Transfer of Care Note  [name]  Procedure(s) Performed: TRANSPOSITION, VEIN, BASILIC (Left: Arm Upper)  Patient Location: PACU  Anesthesia Type:General  Level of Consciousness: awake, alert , and oriented  Airway & Oxygen Therapy: Patient Spontanous Breathing and Patient connected to nasal cannula oxygen  Post-op Assessment: Report given to RN and Post -op Vital signs reviewed and stable  Post vital signs: Reviewed and stable  Last Vitals:  Vitals Value Taken Time  BP 121/74 09/11/24 12:28  Temp    Pulse 67 09/11/24 12:30  Resp 23 09/11/24 12:30  SpO2 99 % 09/11/24 12:30  Vitals shown include unfiled device data.  Last Pain:  Vitals:   09/11/24 0738  TempSrc:   PainSc: 0-No pain         Complications: No notable events documented.

## 2024-09-12 ENCOUNTER — Encounter (HOSPITAL_COMMUNITY): Payer: Self-pay | Admitting: Vascular Surgery

## 2024-09-14 ENCOUNTER — Other Ambulatory Visit (HOSPITAL_COMMUNITY): Payer: Self-pay | Admitting: Physician Assistant

## 2024-09-19 ENCOUNTER — Ambulatory Visit: Attending: Cardiology | Admitting: Pharmacist

## 2024-09-19 DIAGNOSIS — Z7901 Long term (current) use of anticoagulants: Secondary | ICD-10-CM | POA: Diagnosis not present

## 2024-09-19 DIAGNOSIS — Z952 Presence of prosthetic heart valve: Secondary | ICD-10-CM | POA: Diagnosis not present

## 2024-09-19 DIAGNOSIS — I4891 Unspecified atrial fibrillation: Secondary | ICD-10-CM

## 2024-09-19 LAB — POCT INR: INR: 1.4 — AB (ref 2.0–3.0)

## 2024-09-19 NOTE — Progress Notes (Signed)
 Description   INR 1.4: Take two tablets today and then change schedule to a whole tablet daily except 1/2 tablet on Monday, Wednesday, and Friday.  The evening of the procedure resume warfarin unless the Dr states otherwise.   Recheck INR 1 week post procedure. (Fax # [phone]) Coumadin  Clinic [phone] ,  Dialysis M-W-F, Amiodarone  200 mg daily

## 2024-09-19 NOTE — Patient Instructions (Signed)
 Description   INR 1.4: Take two tablets today and then change schedule to a whole tablet daily except 1/2 tablet on Monday, Wednesday, and Friday.  The evening of the procedure resume warfarin unless the Dr states otherwise.   Recheck INR 1 week post procedure. (Fax # [phone]) Coumadin  Clinic [phone] ,  Dialysis M-W-F, Amiodarone  200 mg daily

## 2024-09-26 ENCOUNTER — Other Ambulatory Visit (HOSPITAL_COMMUNITY): Payer: Self-pay | Admitting: Cardiology

## 2024-09-26 DIAGNOSIS — Z952 Presence of prosthetic heart valve: Secondary | ICD-10-CM

## 2024-09-27 ENCOUNTER — Telehealth: Payer: Self-pay

## 2024-09-27 NOTE — Progress Notes (Unsigned)
 POST OPERATIVE OFFICE NOTE    CC:  F/u for surgery  HPI:  This is a 73 y.o. female who is s/p left 1st stage BVT 07/25/2024 and left 2nd stage BVT on 09/11/2024 by Dr. Lanis.  She has no previous access procedures.   Her dialysis center called yesterday to report her bruit was not as easy to feel as it had been.    Pt states she does not have pain/numbness in the left hand.    The pt is on dialysis M/W/F at [facility] location.   Allergies  Allergen Reactions   Spironolactone      hyperkalemia    Current Outpatient Medications  Medication Sig Dispense Refill   albuterol  (PROVENTIL ) (2.5 MG/3ML) 0.083% nebulizer solution Take 2.5 mg by nebulization 2 (two) times daily.     amiodarone  (PACERONE ) 200 MG tablet Take 1 tablet (200 mg total) by mouth daily. 90 tablet 3   ascorbic acid  (VITAMIN C ) 500 MG tablet Take 0.5 tablets (250 mg total) by mouth 2 (two) times daily. 30 tablet 0   atorvastatin  (LIPITOR) 10 MG tablet Take 1 tablet (10 mg total) by mouth daily. 90 tablet 3   iron  sucrose in sodium chloride  0.9 % 100 mL 50 mg.     methimazole  (TAPAZOLE ) 5 MG tablet Take 1 tablet by mouth once daily 30 tablet 0   Methoxy PEG-Epoetin Beta (MIRCERA IJ) 30 mcg.     midodrine  (PROAMATINE ) 10 MG tablet Take 2 tablets (20 mg total) by mouth 3 (three) times daily with meals. 350 tablet 3   oxycodone  (OXY-IR) 5 MG capsule Take 5 mg by mouth every 4 (four) hours as needed for pain.     torsemide  (DEMADEX ) 100 MG tablet Take 1 tablet (100 mg total) by mouth every Tuesday, Thursday, and Saturday at 6 PM. 20 tablet 1   warfarin (COUMADIN ) 5 MG tablet TAKE 1/2 TO 1 (ONE-HALF TO ONE) TABLET BY MOUTH ONCE DAILY OR  AS  DIRECETD  BY  THE  COUMADIN   CLINIC 30 tablet 0   No current facility-administered medications for this visit.     ROS:  See HPI  Physical Exam:  Today's Vitals   09/28/24 0910  BP: (!) 143/81  Pulse: 84  Temp: 98.1 F (36.7 C)  TempSrc: Temporal  Weight: 152 lb 14.4 oz  (69.4 kg)  PainSc: 0-No pain   Body mass index is 27.09 kg/m.   Incision:  well healed Extremities:   There is a palpable left radial pulse.   Motor and sensory are in tact.   There is a faint thrill and bruit.  It is easier palpated proximally but still faint.      Assessment/Plan:  This is a 73 y.o. female who is s/p: left 1st stage BVT 07/25/2024 and left 2nd stage BVT on 09/11/2024 by Dr. Lanis   -the pt does not have evidence of steal. -she has a faint bruit in the fistula.  I discussed with Dr. Lanis and given how thin the vein was at 2nd stage surgery, he does not want to proceed with fistulogram at this time.  We will bring her back in 3-4 weeks with a fistula duplex.  I discussed with pt that this may be thrombosed and if it isn't may proceed with fistulogram pending results of u/s.  She expressed good understanding.  -if pt has tunneled catheter, this can be removed at the discretion of the dialysis center once the pt's access has been successfully cannulated to  their satisfaction.  -discussed with pt that access does not last forever and will need intervention or even new access at some point.     Lucie Apt, [facility] [phone]  Clinic MD:  Lanis

## 2024-09-27 NOTE — Telephone Encounter (Signed)
 Vanessa Ladd, NP at [facility] called to report minimal bruit that has declined over the last week.  Patient had HD today via RIJ TDC, 09/27/24.

## 2024-09-28 ENCOUNTER — Ambulatory Visit

## 2024-09-28 ENCOUNTER — Other Ambulatory Visit: Payer: Self-pay

## 2024-09-28 ENCOUNTER — Encounter: Payer: Self-pay | Admitting: Physician Assistant

## 2024-09-28 ENCOUNTER — Ambulatory Visit: Attending: Surgery | Admitting: Physician Assistant

## 2024-09-28 VITALS — BP 143/81 | HR 84 | Temp 98.1°F | Wt 152.9 lb

## 2024-09-28 DIAGNOSIS — Z952 Presence of prosthetic heart valve: Secondary | ICD-10-CM

## 2024-09-28 DIAGNOSIS — Z7901 Long term (current) use of anticoagulants: Secondary | ICD-10-CM

## 2024-09-28 DIAGNOSIS — N186 End stage renal disease: Secondary | ICD-10-CM

## 2024-09-28 LAB — POCT INR: INR: 1.6 — AB (ref 2.0–3.0)

## 2024-09-28 NOTE — Patient Instructions (Signed)
 Take 2 tablets today only then continue 1 tablet daily except 1/2 tablet on Monday, Wednesday, and Friday.   Recheck INR 3 weeks (Fax # [phone]) Coumadin  Clinic [phone] ,  Dialysis M-W-F, Amiodarone  200 mg daily

## 2024-09-28 NOTE — Progress Notes (Signed)
 INR 1.6 Please see anticoagulation encounter Take 2 tablets today only then continue 1 tablet daily except 1/2 tablet on Monday, Wednesday, and Friday.   Recheck INR 3 weeks (Fax # [phone]) Coumadin  Clinic [phone] ,  Dialysis M-W-F, Amiodarone  200 mg daily

## 2024-10-05 ENCOUNTER — Other Ambulatory Visit: Payer: Self-pay | Admitting: *Deleted

## 2024-10-05 DIAGNOSIS — N186 End stage renal disease: Secondary | ICD-10-CM

## 2024-10-10 ENCOUNTER — Other Ambulatory Visit (HOSPITAL_COMMUNITY): Payer: Self-pay | Admitting: Physician Assistant

## 2024-10-19 ENCOUNTER — Ambulatory Visit: Attending: Cardiology

## 2024-10-19 DIAGNOSIS — Z952 Presence of prosthetic heart valve: Secondary | ICD-10-CM

## 2024-10-19 DIAGNOSIS — Z7901 Long term (current) use of anticoagulants: Secondary | ICD-10-CM

## 2024-10-19 LAB — POCT INR: INR: 2.6 (ref 2.0–3.0)

## 2024-10-19 NOTE — Patient Instructions (Signed)
 continue 1 tablet daily except 1/2 tablet on Monday, Wednesday, and Friday.   Recheck INR 4 weeks (Fax # [phone]) Coumadin  Clinic [phone] ,  Dialysis M-W-F, Amiodarone  200 mg daily

## 2024-10-19 NOTE — Progress Notes (Signed)
 INR  2.6 Please see anticoagulation encounter continue 1 tablet daily except 1/2 tablet on Monday, Wednesday, and Friday.   Recheck INR 4 weeks (Fax # [phone]) Coumadin  Clinic [phone] ,  Dialysis M-W-F, Amiodarone  200 mg daily

## 2024-10-22 ENCOUNTER — Other Ambulatory Visit (HOSPITAL_COMMUNITY): Payer: Self-pay | Admitting: Cardiology

## 2024-10-22 DIAGNOSIS — Z952 Presence of prosthetic heart valve: Secondary | ICD-10-CM

## 2024-10-23 NOTE — Telephone Encounter (Signed)
 Refil request for warfarin:  Last INR was 2.6 on 10/19/24 Next INR due 11/16/24 LOV was 09/07/24  Refill approved.

## 2024-11-01 NOTE — Progress Notes (Unsigned)
 POST OPERATIVE OFFICE NOTE  CC:  F/u for surgery  HPI:  This is a 73 y.o. female who is s/p left 1st stage BVT 07/25/2024 and left 2nd stage BVT on 09/11/2024 by Dr. Lanis.    Patient was last seen in the office with low flow in the brachiobasilic fistula.  This was roughly 2 weeks after surgery so therefore we did not pursue fistulogram as the anastomosis was relatively fresh.  At the time of the operation, the vein was very sclerotic.  I was concerned that further stenosis may occur.  On exam today, Berdine was doing well.  She had a great Thanksgiving, and spent time with family.  She is looking forward to going to her nephew's house and [facility]  for Christmas.  She continues to undergo dialysis from a right sided tunneled dialysis catheter.   Allergies  Allergen Reactions   Spironolactone      hyperkalemia    Current Outpatient Medications  Medication Sig Dispense Refill   albuterol  (PROVENTIL ) (2.5 MG/3ML) 0.083% nebulizer solution Take 2.5 mg by nebulization 2 (two) times daily.     amiodarone  (PACERONE ) 200 MG tablet Take 1 tablet (200 mg total) by mouth daily. 90 tablet 3   ascorbic acid  (VITAMIN C ) 500 MG tablet Take 0.5 tablets (250 mg total) by mouth 2 (two) times daily. 30 tablet 0   atorvastatin  (LIPITOR) 10 MG tablet Take 1 tablet (10 mg total) by mouth daily. 90 tablet 3   iron  sucrose in sodium chloride  0.9 % 100 mL 50 mg.     methimazole  (TAPAZOLE ) 5 MG tablet Take 1 tablet by mouth once daily 30 tablet 3   Methoxy PEG-Epoetin Beta (MIRCERA IJ) 30 mcg.     midodrine  (PROAMATINE ) 10 MG tablet Take 2 tablets (20 mg total) by mouth 3 (three) times daily with meals. 350 tablet 3   oxycodone  (OXY-IR) 5 MG capsule Take 5 mg by mouth every 4 (four) hours as needed for pain.     torsemide  (DEMADEX ) 100 MG tablet Take 1 tablet (100 mg total) by mouth every Tuesday, Thursday, and Saturday at 6 PM. 20 tablet 1   warfarin (COUMADIN ) 5 MG tablet TAKE 1/2 TO 1  (ONE-HALF TO ONE) TABLET BY MOUTH ONCE DAILY OR AS DIRECTED BY THE COUMADIN  CLINIC 30 tablet 3   No current facility-administered medications for this visit.     ROS:  See HPI  Physical Exam:  There were no vitals filed for this visit.  There is no height or weight on file to calculate BMI.   Incision:  well healed Extremities:   There is a palpable left radial pulse.   Motor and sensory are in tact.   There is a faint thrill and bruit.  It is easier palpated proximally but still faint.      Assessment/Plan:  This is a 73 y.o. female who is s/p: left 1st stage BVT 07/25/2024 and left 2nd stage BVT on 09/11/2024.  On physical exam, she has a soft, palpable thrill Imaging demonstrates very low flow in the left arm brachiobasilic fistula, this is due to proximal stenosis due to significant sclerosis of the vein.  This was appreciated IntraOp.  I do not think that there is utility in attempting balloon venoplasty.  We discussed that she would be best served with fistula ligation, AV graft placement in the left arm.  I also offered her a right arm brachiobasilic fistula, but being that she is right-handed, she elected Sten the  left arm.  She would like to pursue surgery after the first year and go to the holidays which I think is very reasonable. Will book left arm AV graft surgery at her convenience.

## 2024-11-02 ENCOUNTER — Ambulatory Visit: Admitting: Vascular Surgery

## 2024-11-02 ENCOUNTER — Encounter: Payer: Self-pay | Admitting: Vascular Surgery

## 2024-11-02 ENCOUNTER — Ambulatory Visit (HOSPITAL_COMMUNITY)
Admission: RE | Admit: 2024-11-02 | Discharge: 2024-11-02 | Disposition: A | Discharge status: Home / Self Care | Source: Ambulatory Visit | Attending: Vascular Surgery | Admitting: Vascular Surgery

## 2024-11-02 VITALS — BP 129/80 | HR 75 | Temp 98.3°F | Resp 18 | Ht 63.0 in | Wt 148.2 lb

## 2024-11-02 DIAGNOSIS — N186 End stage renal disease: Secondary | ICD-10-CM

## 2024-11-02 DIAGNOSIS — Z992 Dependence on renal dialysis: Secondary | ICD-10-CM | POA: Insufficient documentation

## 2024-11-06 ENCOUNTER — Other Ambulatory Visit: Payer: Self-pay

## 2024-11-06 ENCOUNTER — Telehealth: Payer: Self-pay

## 2024-11-06 DIAGNOSIS — N186 End stage renal disease: Secondary | ICD-10-CM

## 2024-11-06 NOTE — Telephone Encounter (Signed)
 Attempted to call for surgery scheduling. LVM

## 2024-11-16 ENCOUNTER — Telehealth: Payer: Self-pay | Admitting: *Deleted

## 2024-11-16 ENCOUNTER — Telehealth (HOSPITAL_BASED_OUTPATIENT_CLINIC_OR_DEPARTMENT_OTHER): Payer: Self-pay | Admitting: *Deleted

## 2024-11-16 ENCOUNTER — Telehealth (HOSPITAL_COMMUNITY): Payer: Self-pay

## 2024-11-16 ENCOUNTER — Ambulatory Visit: Attending: Cardiology

## 2024-11-16 DIAGNOSIS — Z952 Presence of prosthetic heart valve: Secondary | ICD-10-CM

## 2024-11-16 DIAGNOSIS — Z7901 Long term (current) use of anticoagulants: Secondary | ICD-10-CM

## 2024-11-16 LAB — POCT INR: INR: 3 (ref 2.0–3.0)

## 2024-11-16 NOTE — Progress Notes (Signed)
 INR 3.0 Please see anticoagulation encounter continue 1 tablet daily except 1/2 tablet on Monday, Wednesday, and Friday.  Graft Insertion Upper Extremity 1/27; Recheck INR 4 weeks (Fax # [phone]) Coumadin  Clinic [phone] ,  Dialysis M-W-F, Amiodarone  200 mg daily

## 2024-11-16 NOTE — Telephone Encounter (Signed)
° °  Pre-operative Risk Assessment    Patient Name: Vanessa [name]  DOB: 1951/03/08 MRN: [id number]   Date of last office visit: 09/07/24 DR. MCLEAN; HEART FAILURE Date of next office visit: 12/07/24 HEART FAILURE    Request for Surgical Clearance    Procedure:  LEFT ARM AVG  Date of Surgery:  Clearance 12/26/24                                Surgeon:  DR. DOROTHA COLE ROBINS Surgeon's Group or Practice Name:  [facility] Phone number:  [phone] Fax number:  [phone]   Type of Clearance Requested:   - Medical  - Pharmacy:  Hold Warfarin (Coumadin ) (LOVENOX  BRIDGE?)   Type of Anesthesia:  MAC   Additional requests/questions:    Signed, Tomeeka Plaugher   11/16/2024, 11:02 AM

## 2024-11-16 NOTE — Telephone Encounter (Signed)
 We have received the official request and will proceed with the work up on that note. Will remove this note from our preop pool.

## 2024-11-16 NOTE — Telephone Encounter (Signed)
 Pt is scheduled to come in today to have INR checked- pt on coumadin .   When reviewing chart pt is scheduled to have a left arm AV graft on 12/26/2024. Physician to do the surgery is Fonda Simpers.

## 2024-11-16 NOTE — Telephone Encounter (Signed)
°  [facility]   Pre-operative Risk Assessment   HEARTCARE STAFF-IMPORTANT INSTRUCTIONS 1 Red and Blue Text will auto delete once note is signed or closed. 2 Press F2 to navigate through template.   3 On drop down lists, L click to select >> R click to activate next field 4 Reason for Visit format is IMPORTANT!!  See Directions on No. 2 below. 5 Please review chart to determine if there is already a clearance note open for this procedure!!  DO NOT duplicate if a note already exists!!    :1}      Request for Surgical Clearance    Procedure:  Left Arm arteriovenous Graft   Date of Surgery:  Clearance 12/26/24                                 Surgeon:  Dr. Lanis Surgeon's Group or Practice Name:  [facility] Phone number:  [phone] Fax number:  [phone]   Type of Clearance Requested:   - Medical  - Pharmacy:  Hold Warfarin (Coumadin ) 3 days prior Does she need Bridge with Lovenox  ?   Type of Anesthesia:  Not Indicated   Additional requests/questions:  Please advise surgeon/provider what medications should be held. Please fax a copy of Clearance to the surgeon's office.  Signed, Lisa CHRISTELLA Sergeant   11/16/2024, 11:22 AM   [facility] Jordan Lee, NP [facility] [address] Heart and Vascular [place] [address] [phone] (office) [phone] (fax)

## 2024-11-16 NOTE — Telephone Encounter (Signed)
 Dr. Rolan, you recently saw this pt in clinic. Are you able to comment on surgical clearance for upcoming left arm avg scheduled for 12/26/2024? Please route your response to P CV DIV PREOP. Thank you!

## 2024-11-16 NOTE — Telephone Encounter (Signed)
 If pt needs to hold warfarin for procedure please fax request to [phone]

## 2024-11-16 NOTE — Patient Instructions (Signed)
 continue 1 tablet daily except 1/2 tablet on Monday, Wednesday, and Friday.  Graft Insertion Upper Extremity 1/27; Recheck INR 4 weeks (Fax # [phone]) Coumadin  Clinic [phone] ,  Dialysis M-W-F, Amiodarone  200 mg daily

## 2024-11-16 NOTE — Telephone Encounter (Signed)
 Pharmacy please advise on holding warfarin prior to left arm avg scheduled for 12/26/2024. Thank you.

## 2024-11-16 NOTE — Telephone Encounter (Signed)
Clearance faxed via epic 

## 2024-11-20 DIAGNOSIS — I509 Heart failure, unspecified: Secondary | ICD-10-CM | POA: Insufficient documentation

## 2024-11-20 DIAGNOSIS — D6869 Other thrombophilia: Secondary | ICD-10-CM | POA: Insufficient documentation

## 2024-11-20 NOTE — Telephone Encounter (Signed)
 Patient with diagnosis of A Fib on warfarin for anticoagulation.    Procedure: LEFT ARM AVG  Date of procedure: 12/26/24   CHA2DS2-VASc Score = 5  This indicates a 7.2% annual risk of stroke. The patient's score is based upon: CHF History: 1 HTN History: 1 Diabetes History: 0 Stroke History: 0 Vascular Disease History: 1 Age Score: 1 Gender Score: 1   CrCl   7 ml/min Platelet count 186k   Per office protocol, patient can hold warfarin for 5 days prior to procedure.    Patient will not need bridging with Lovenox  (enoxaparin ) around procedure.  **This guidance is not considered finalized until pre-operative APP has relayed final recommendations.**

## 2024-11-21 NOTE — Telephone Encounter (Signed)
"  ° °  Patient Name: Vanessa [name]  DOB: 07-Oct-1951 MRN: [id number]  Primary Cardiologist: None  Chart reviewed as part of pre-operative protocol coverage. Given past medical history and time since last visit, based on ACC/AHA guidelines, Vanessa [name] is at acceptable risk for the planned procedure without further cardiovascular testing.   Per Dr. Rolan:  [facility] for AVG placement, can hold warfarin as needed.  Per Pharmacy:  Per office protocol, patient can hold warfarin for 5 days prior to procedure. Patient will not need bridging with Lovenox  (enoxaparin ) around procedure.  The patient was advised that if she develops new symptoms prior to surgery to contact our office to arrange for a follow-up visit, and she verbalized understanding.  I will route this recommendation to the requesting party via Epic fax function and remove from pre-op  pool.  Please call with questions.  Nilesh Stegall E Hindy Perrault, NP 11/21/2024, 8:20 AM  "

## 2024-12-06 ENCOUNTER — Telehealth (HOSPITAL_COMMUNITY): Payer: Self-pay

## 2024-12-06 NOTE — Telephone Encounter (Signed)
 Called to confirm/remind patient of their appointment at the [facility] on 12/07/24.   Appointment:   [x] Confirmed  [] Left mess   [] No answer/No voice mail  [] VM Full/unable to leave message  [] Phone not in service  Patient reminded to bring all medications and/or complete list.  Confirmed patient has transportation. Gave directions, instructed to utilize valet parking.

## 2024-12-07 ENCOUNTER — Ambulatory Visit (HOSPITAL_COMMUNITY)
Admission: RE | Admit: 2024-12-07 | Discharge: 2024-12-07 | Disposition: A | Discharge status: Home / Self Care | Payer: Self-pay | Source: Ambulatory Visit | Attending: Cardiology | Admitting: Cardiology

## 2024-12-07 ENCOUNTER — Encounter (HOSPITAL_COMMUNITY): Payer: Self-pay

## 2024-12-07 ENCOUNTER — Other Ambulatory Visit (HOSPITAL_COMMUNITY): Payer: Self-pay | Admitting: Cardiology

## 2024-12-07 VITALS — BP 128/82 | HR 87 | Ht 63.0 in | Wt 146.6 lb

## 2024-12-07 DIAGNOSIS — Z79899 Other long term (current) drug therapy: Secondary | ICD-10-CM | POA: Insufficient documentation

## 2024-12-07 DIAGNOSIS — I272 Pulmonary hypertension, unspecified: Secondary | ICD-10-CM | POA: Insufficient documentation

## 2024-12-07 DIAGNOSIS — Z992 Dependence on renal dialysis: Secondary | ICD-10-CM | POA: Diagnosis not present

## 2024-12-07 DIAGNOSIS — I493 Ventricular premature depolarization: Secondary | ICD-10-CM | POA: Insufficient documentation

## 2024-12-07 DIAGNOSIS — Z91199 Patient's noncompliance with other medical treatment and regimen due to unspecified reason: Secondary | ICD-10-CM | POA: Insufficient documentation

## 2024-12-07 DIAGNOSIS — I5022 Chronic systolic (congestive) heart failure: Secondary | ICD-10-CM | POA: Insufficient documentation

## 2024-12-07 DIAGNOSIS — Z7901 Long term (current) use of anticoagulants: Secondary | ICD-10-CM | POA: Insufficient documentation

## 2024-12-07 DIAGNOSIS — I2721 Secondary pulmonary arterial hypertension: Secondary | ICD-10-CM | POA: Insufficient documentation

## 2024-12-07 DIAGNOSIS — I428 Other cardiomyopathies: Secondary | ICD-10-CM | POA: Diagnosis not present

## 2024-12-07 DIAGNOSIS — E041 Nontoxic single thyroid nodule: Secondary | ICD-10-CM

## 2024-12-07 DIAGNOSIS — Z953 Presence of xenogenic heart valve: Secondary | ICD-10-CM | POA: Insufficient documentation

## 2024-12-07 DIAGNOSIS — E0520 Thyrotoxicosis with toxic multinodular goiter without thyrotoxic crisis or storm: Secondary | ICD-10-CM | POA: Diagnosis not present

## 2024-12-07 DIAGNOSIS — N186 End stage renal disease: Secondary | ICD-10-CM | POA: Insufficient documentation

## 2024-12-07 DIAGNOSIS — Z952 Presence of prosthetic heart valve: Secondary | ICD-10-CM

## 2024-12-07 DIAGNOSIS — I480 Paroxysmal atrial fibrillation: Secondary | ICD-10-CM | POA: Diagnosis not present

## 2024-12-07 DIAGNOSIS — I4891 Unspecified atrial fibrillation: Secondary | ICD-10-CM

## 2024-12-07 MED ORDER — AMIODARONE HCL 100 MG PO TABS: 100.0000 mg | ORAL_TABLET | Freq: Every day | ORAL | 3 refills | Status: AC

## 2024-12-07 NOTE — Patient Instructions (Signed)
 Decrease Amiodarone  to 100 mg daily - updated Rx sent for 100 mg tablets. A cardiac monitor has been ordered for you today - see below. Return to see Dr. Rolan in 3 months.  CALL [phone] IN MARCH TO SCHEDULE THIS APPOINTMENT IF YOU HAVEN'T HEARD FROM US . Please call us  at [phone] if any questions or concerns prior to your next appointment.    Your provider has recommended that  you wear a Zio Patch for 14 days.  This monitor will record your heart rhythm for our review.  IF you have any symptoms while wearing the monitor please press the button.  If you have any issues with the patch or you notice a red or orange light on it please call the company at [phone].  Once you remove the patch please mail it back to the company as soon as possible so we can get the results.  Zio patch placed onto patient.  All instructions and information reviewed with patient, they verbalize understanding with no questions.

## 2024-12-07 NOTE — Progress Notes (Signed)
 "    ADVANCED HF CLINIC NOTE   PCP:  Alvera Reagin, PA  HF Cardiologist: Dr. Rolan   HPI: Vanessa [name] is a 74 y.o. with history of nonischemic cardiomyopathy and presents for followup of CHF. Patient has had a low EF recognized since at least 9/14, when echo showed EF 40-45%.  In 06/14/25, her child died, which led to significant stress.  She developed worsening exertional dyspnea and subsequent echo showed a fall in EF to 25-30%.  She had left and right heart catheterization in 3/16 with no significant coronary disease detected and moderately elevated right and left heart filling pressures along with pulmonary venous hypertension.  Echo was done 12/16: EF 45%, diffuse hypokinesis. She had a repeat echo 11/17 => EF 45% with moderately dilated LV, moderate MR.  Echo 11/18 showed EF stable at 45% with mild LV dilation and mildly decreased RV systolic function, moderate MR.   Echo in 11/19.  EF 30-35% with mild LV hypertrophy, mild-moderately decreased RV systolic function, possible severe MR with restricted posterior leaflet, PASP 50 mmHg, severe LAE.  TEE was done in 11/19 to assess mitral regurgitation.  This showed severe MR with restricted posterior leaflet.   In 1/20, she had Mitraclip with reduction in MR to 1+.    She had echo in 9/20 with EF 30-35%, severe LAE, normal RV, s/p Mitraclip with mild-moderate MR and mild MS.  The IVC was dilated, suggesting volume overload.   She was unable to tolerate Bidil due to lightheadedness.   Echo in 1/21 showed EF 40-45%, RV normal, biatrial dilation, mild mitral stenosis mean 5 mmHg, mild-moderate MR (s/p Mitraclip).  Echo in 1/22 showed EF 40-45% with moderate LV dilation, normal RV size and systolic function, severe LAE, s/p Mitraclip with probably mild mitral stenosis (mean gradient 7, MVA 2.2 cm^2 by PHT), moderate MR.   Echo in 3/23 showed EF 40-45%, moderate LV dilation, normal RV, normal IVC, s/p MV repair with Mitraclip with mild mitral  stenosis mean gradient 7 mmHg, moderate MR.   Echo (9/23) showed EF stable 40-45%, mild MS (mean gradient 7 mmHg) and mild to moderate MR s/p Mitraclip.   Echo in 9/24 showed EF 40-45%, mild LV dilation and mild LVH, mildly decreased RV systolic function, PASP 47 mmHg, s/p Mitraclip with mean gradient 6 mmHg, suspect severe mitral regurgitation, IVC normal, small secundum ASD.  TEE in 10/24 showed EF 40-45%, mild LV dilation, moderate RV dysfunction with mild RV enlargement, severe LAE, 2 Mitraclips in place with mean gradient 4 mmHg and severe eccentric MR from between the 2 clips with ERO 0.54 cm^2 and peak RV-RA gradient 63 mmHg.  I reviewed the TEE with structural heart colleagues, and there does not appear to be room between the 2 existing Mitraclips to place a 3rd clip.  R/LHC arranged (11/24) as part of pre-surgical evaluation for MVR, which showed no significant CAD, normal RA pressure with mildly elevated PCWP, preserved CO and PAPi, and moderate mixed pulmonary arterial and pulmonary venous hypertension, likely due to mitral valve regurgitation.  She had bioprosthetic mitral valve replacement + tricuspid valve repair on 11/29/23.  She developed post-op surgical site bleeding with tamponade and was taken back to the OR the next day.  She had a very difficult course after this with post-op shock, then AKI with CVVH.  She was eventually transitioned to [facility].  She went to CIR and now is home. Echo (3/25) showed EF 35-40%, moderate RV dysfunction with moderate RV  enlargement, PASP 50 mmHg, bioprosthetic mitral valve with mean gradient up to 13 mmHg with appearance of a fixed posterior leaflet (?severe stenosis), s/p TV repair with mild-moderate TR, mild AS with mean gradient 9 mmHg.  She has been back to the ER several times with hypotension with dialysis and is now on midodrine  20 mg tid + Florinef .   Echo in 7/25 showed EF 30-35%, RV mildly reduced, RVSP 64 mmHg, mean bioprosthetic mitral valve with  gradient 6.5 mmHg, TV repair with mild to moderate TR, mild AS with mean gradient 8 mmHg, dilated IVC  She is now ESRD. She was seen by [facility] for AV fistula creations and scheduled for 12/27/23.  She returns today for HF follow up. Overall feeling okay. NYHA II. Still gets hypotensive at times with HD. Has stopped her methimazole  due to hair loss, does not want to restart. No SOB, CP, or edema. Eating well, staying away from high K foods. Has endocrine appointment at the end of the month, as well as appointment for AVF creation.   PMH: 1. Cardiomyopathy: Nonischemic.  Echo (9/14) with EF 40-45%.  Echo (2/16) with EF 25-30%.  Echo (4/16) with EF 30-35%, moderate MR.  Echo (6/16) with EF 40%, moderate LV dilation, moderate diastolic dysfunction, moderate MR, moderate TR, PA systolic pressure 52 mmHg. RHC/LHC (3/16) with no significant CAD, EF 20-25%, 3+ MR; mean RA 13, PA 70/30 mean 45, mean PCWP 31 mmHg, CI 2.2, PVR 3.1 WU.  Echo (12/16) with EF 45%, diffuse hypokinesis, moderate MR, normal RV size and systolic function.  - Echo (11/17): EF 45%, moderately dilated LV, moderate eccentric posterior MR, PASP 42 mmHg, normal RV size and systolic function.  - Echo (11/18): EF 45%, mild LV dilation, moderate diastolic dysfunction, normal RV size with mildly decreased systolic function, PASP 50 mmHg, moderate MR.  - Echo (11/19): EF 30-35%, mild LV dilation, severe LAE, mild-moderately decreased RV systolic function, possible severe MR with restricted posterior leaflet, PASP 50 mmHg.  - Echo (1/20): EF 30-35%, severe LV dilation, s/p Mitraclip with mild MR and mild MS, PASP 48 mmHg.  - RHC (1/20, with Mitraclip): mean RA 10, mean LA 18 - Echo (2/20): EF 30-35%, s/p mitraclip with mild mitral stenosis, mean MV gradient 5 mmHg, moderate MR, PASP 56 mmHg.  - Echo (9/20): EF 30-35%, severe LAE, normal RV, s/p Mitraclip with mild-moderate MR and mild MS.  The IVC was dilated, suggesting volume overload.  - Echo  (1/21): EF 40-45%, RV normal, biatrial dilation, mild mitral stenosis mean 5 mmHg, mild-moderate MR (s/p Mitraclip).  - Echo (1/22): EF 40-45% with moderate LV dilation, normal RV size and systolic function, severe LAE, s/p Mitraclip with probably mild mitral stenosis (mean gradient 7, MVA 2.2 cm^2 by PHT), moderate MR. - Echo (3/23): EF 40-45%, moderate LV dilation, normal RV, normal IVC, s/p MV repair with Mitraclip with mild mitral stenosis mean gradient 7 mmHg, moderate MR.  - Echo (9/23): EF 40-45%, grade I DD, normal RV, s/p MC repair with Mitraclip with mild mitral stenosis mean gradient 7 mmHg, moderate MR. - Echo (9/24): EF 40-45%, mild LV dilation and mild LVH, mildly decreased RV systolic function, PASP 47 mmHg, s/p Mitraclip with mean gradient 6 mmHg, suspect severe mitral regurgitation, IVC normal, small secundum ASD.  - TEE (10/24): EF 40-45%, mild LV dilation, moderate RV dysfunction with mild RV enlargement, severe LAE, 2 Mitraclips in place with mean gradient 4 mmHg and severe eccentric MR from between the 2 clips  with ERO 0.54 cm^2 and peak RV-RA gradient 63 mmHg. - R/LHC (11/24): no significant CAD; RA 5, PA 61/19 (39), PCWP mean 18, CO/CI (Fick) 4.67/2.52, PVR 4.5 WU - Echo (3/25): EF 35-40%, moderate RV dysfunction with moderate RV enlargement, PASP 50 mmHg, bioprosthetic mitral valve with mean gradient up to 13 mmHg with appearance of a fixed posterior leaflet (?severe stenosis), s/p TV repair with mild-moderate TR, mild AS with mean gradient 9 mmHg.  - Echo (7/25): EF 30-35%, RV mildly reduced, RVSP 64 mmHg, mean bioprosthetic mitral valve with gradient 6.5 mmHg, TV repair with mild to moderate TR, mild AS with mean gradient 8 mmHg, dilated IVC 2. PVCs: Holter (10/15) with 12% PVCs. 3. Pulmonary venous hypertension: CTA chest (7/16) negative for PE. PFTs (7/16) with minimal obstruction, mild restriction, moderately decreased DLCO.  4. Thyroid  nodules: Biopsy benign.  5. ESRD:  Post-op MVR.   6. Mitral regurgitation: TEE (11/19) with EF 30-35%, moderate LV dilation, severe MR with posterior leaflet restriction and ERO 0.41 cm^2, no mitral stenosis, mild to moderately decreased RV systolic function, peak RV-RA gradient 50 mmHg.  - Mitraclip placement x 2 in 1/20 with 1+ residual MR.  - Echo (2/20) with moderate residual MR, mild MS with mean gradient 5 mmHg.  - Echo (9/20) with mild-moderate MR, mild MS.  - Echo (1/21) with mild-moderate MR, mild MS s/p Mitraclip - Echo (1/22) with moderate residual MR, mild-moderate mitral stenosis.  - Echo (3/23) with moderate MR, mild MS s/p Mitraclip.  - Echo (9/23) with mild-moderate MR, mild MS mean gradient 7 mmHg - Echo (9/24) with severe MR, mild MS mean gradient 6 mmHg post-Mitraclip.  - TEE (10/24): with 2 Mitraclips in place with mean gradient 4 mmHg and severe eccentric MR from between the 2 clips with ERO 0.54 cm^2. - Bioprosthetic mitral valve replacement + tricuspid valve repair on 11/29/23. 7. Hyperthyroism: Toxic multinodular goiter.  8. Atrial fibrillation: Paroxysmal.   SH: Lives alone, only child passed away in 07/04/2025, nonsmoker, no ETOH or drugs.   FH: Father with MIs  ROS: All systems reviewed and negative except as per HPI.   Current Outpatient Medications  Medication Sig Dispense Refill   albuterol  (PROVENTIL ) (2.5 MG/3ML) 0.083% nebulizer solution Take 2.5 mg by nebulization 2 (two) times daily.     amiodarone  (PACERONE ) 200 MG tablet Take 1 tablet (200 mg total) by mouth daily. 90 tablet 3   ascorbic acid  (VITAMIN C ) 500 MG tablet Take 0.5 tablets (250 mg total) by mouth 2 (two) times daily. 30 tablet 0   atorvastatin  (LIPITOR) 10 MG tablet Take 1 tablet (10 mg total) by mouth daily. 90 tablet 3   iron  sucrose in sodium chloride  0.9 % 100 mL 50 mg.     methimazole  (TAPAZOLE ) 5 MG tablet Take 1 tablet by mouth once daily 30 tablet 3   Methoxy PEG-Epoetin Beta (MIRCERA IJ) 30 mcg.     midodrine   (PROAMATINE ) 10 MG tablet Take 2 tablets (20 mg total) by mouth 3 (three) times daily with meals. 350 tablet 3   torsemide  (DEMADEX ) 100 MG tablet Take 1 tablet (100 mg total) by mouth every Tuesday, Thursday, and Saturday at 6 PM. 20 tablet 1   warfarin (COUMADIN ) 5 MG tablet TAKE 1/2 TO 1 (ONE-HALF TO ONE) TABLET BY MOUTH ONCE DAILY OR AS DIRECTED BY THE COUMADIN  CLINIC 30 tablet 3   No current facility-administered medications for this visit.   There were no vitals taken for this visit.  Wt Readings from Last 3 Encounters:  11/02/24 67.2 kg (148 lb 3.2 oz)  09/28/24 69.4 kg (152 lb 14.4 oz)  09/11/24 68.9 kg (152 lb)   Physical Exam: General: Elderly appearing. No distress  Cardiac: JVP elevated on R with HDL, flat on L. No murmur. Regular Abdomen: Soft, non-distended.  Extremities: Warm and dry.  No edema.  Neuro: A&O x3. Affect pleasant.   Assessment/Plan: 1. Chronic systolic CHF: Long history of nonischemic cardiomyopathy and mitral regurgitation.  Bioprosthetic MVR and tricuspid ring 11/29/23 with Dr. Maryjane. Pre-op  cath with no significant CAD.  Pre-op  echo with EF 40-45%, severe MR. Post-op shock/AKI, went back to the OR with post-op tamponade for clean-out.  Required CVVH then iHD. Post-op echo 3/25 with EF 35-40%, moderate RV dysfunction with moderate RV enlargement, PASP 50 mmHg, bioprosthetic mitral valve with mean gradient up to 13 mmHg with appearance of a fixed posterior leaflet (?severe stenosis), s/p TV repair with mild-moderate TR, mild AS with mean gradient 9 mmHg.  Repeat echo 7/25 EF 30-35%, RV mildly reduced, RVSP 64 mmHg, bioprosthetic mitral valve with lower mean gradient at 6.5 mmHg, TV repair with mild to moderate TR, mild AS with mean gradient 8 mmHg, dilated IVC. She is volume overloaded on exam with NYHA class III symptoms.  - Needs to have her dry weight lowered but volume removal has been limited by hypotension at HD.   - Continue midodrine  to keep BP up at  HD.  - She takes 100 mg Torsemide  on non-HD days. Reports that she sometimes makes a lot of urine after taking. 2. Mitral regurgitation/tricuspid regurgitation:  TEE in 11/19 with severe MR, restricted posterior leaflet.  She had Mitraclip x 2 in 1/20. Post-op echo showed mild MR and mild mitral stenosis.   Echo in 9/24 showed MV s/p Mitraclip x 2 with mean gradient 6 mmHg and severe mitral regurgitation. TEE was then done to evaluate, showing 2 Mitraclips in place with mean gradient 4 mmHg and severe eccentric MR from between the 2 clips with ERO 0.54 cm^2.  She was not thought to be a candidate for an additional Mitraclip due to lack of room between the two existing clips. R/LHC (11/24) showed moderate mixed pulmonary arterial and pulmonary venous hypertension likely due to mitral valve regurgitation.  S/p MV replacement and tricuspid repair on 11/29/23 with Dr. Maryjane. Echo in 3/25 showed bioprosthetic mitral valve with mean gradient up to 13 mmHg with appearance of a fixed posterior leaflet (?severe stenosis).  She would not be a candidate for redo surgery and is not interested in invasive procedure such as TMVR. Repeat echo 7/25 with EF 30-35%, RV mildly reduced, RVSP 64 mmHg, bioprosthetic mitral valve with lower mean gradient at 6.5 mmHg, TV repair with mild to moderate TR, mild AS with mean gradient 8 mmHg, dilated IVC. - Needs antibiotics with dental work.  3. PVCs: Has been on amiodarone .  - Decrease amio to 100 mg daily, with goiter and intermittent noncompliance with methimazole . Repeat 14d zio. If PVC burden down, consider stopping amio with repeat Zio at 3 month follow up.  4. Pulmonary hypertension: Pre-op  RHC (11/24) with PVR 4.5, with moderate mixed pulmonary arterial and pulmonary venous hypertension likely due to mitral valve regurgitation. s/p MVR, PA pressure severely elevated post-op.  Suspect still mixed PAH/PVH with elevated PCWP.  Will hold off on pulmonary vasodilators; small  clinical trials demonstrate no improvement and some markers of harm with PDE5i in PH in the setting of mitral  valve disease. RHC 12/17/23 with mild pulmonary arterial hypertension.  5. Toxic multinodular goiter: She has been on amiodarone  and has had hyperthyroidism. She has stopped methimazole  again - Thyroid  labs 10/25: TSH and freeT3 ok. Free T4 mildly elevated 2.22  - She has followup with endocrinology later this month 6. ESRD: Developed post-op MVR.  She has struggled with HD due to RV failure and hypotension.  Currently requiring midodrine  20 mg tid.  - Continue midodrine .  - Scheduled for AVF creation with Dr. Lanis 12/27/23.  She can hold warfarin for fistula surgery.  7. Atrial fibrillation: Paroxysmal. NSR today.  - Decrease amio to 100 mg daily. Zio 14d. - Continue warfarin.  Follow up in 3 months with Dr. McLean  Monzerrat Wellen, NP  12/07/2024  "

## 2024-12-14 ENCOUNTER — Ambulatory Visit: Payer: Self-pay | Attending: Cardiology

## 2024-12-14 DIAGNOSIS — Z7901 Long term (current) use of anticoagulants: Secondary | ICD-10-CM | POA: Diagnosis not present

## 2024-12-14 DIAGNOSIS — Z952 Presence of prosthetic heart valve: Secondary | ICD-10-CM | POA: Diagnosis not present

## 2024-12-14 LAB — POCT INR: INR: 2.6 (ref 2.0–3.0)

## 2024-12-14 NOTE — Progress Notes (Signed)
"   INR 2.6 Please see anticoagulation encounter continue 1 tablet daily except 1/2 tablet on Monday, Wednesday, and Friday.  Graft Insertion Upper Extremity 1/27 Hold 1/22-26 5 days Recheck INR 3 weeks (Fax # [phone]) Coumadin  Clinic [phone] ,  Dialysis M-W-F, Amiodarone  200 mg daily  "

## 2024-12-14 NOTE — Patient Instructions (Signed)
 continue 1 tablet daily except 1/2 tablet on Monday, Wednesday, and Friday.  Graft Insertion Upper Extremity 1/27 Hold 1/22-26 5 days Recheck INR 3 weeks (Fax # [phone]) Coumadin  Clinic [phone] ,  Dialysis M-W-F, Amiodarone  200 mg daily

## 2024-12-21 ENCOUNTER — Encounter (HOSPITAL_COMMUNITY): Payer: Self-pay | Admitting: Vascular Surgery

## 2024-12-21 ENCOUNTER — Ambulatory Visit: Admitting: "Endocrinology

## 2024-12-21 ENCOUNTER — Other Ambulatory Visit

## 2024-12-21 ENCOUNTER — Other Ambulatory Visit: Payer: Self-pay

## 2024-12-21 ENCOUNTER — Encounter: Payer: Self-pay | Admitting: "Endocrinology

## 2024-12-21 VITALS — BP 100/80 | Ht 63.0 in | Wt 148.0 lb

## 2024-12-21 DIAGNOSIS — E0590 Thyrotoxicosis, unspecified without thyrotoxic crisis or storm: Secondary | ICD-10-CM

## 2024-12-21 DIAGNOSIS — E042 Nontoxic multinodular goiter: Secondary | ICD-10-CM | POA: Diagnosis not present

## 2024-12-21 DIAGNOSIS — R17 Unspecified jaundice: Secondary | ICD-10-CM

## 2024-12-21 NOTE — Patient Instructions (Signed)
" °  VISIT SUMMARY: Today, we discussed your thyroid  condition and other health concerns. You have been taking methimazole  for your hyperthyroidism for two months without significant changes in symptoms. We also addressed your multinodular goiter and jaundice.  YOUR PLAN: -HYPERTHYROIDISM: Hyperthyroidism is a condition where your thyroid  gland produces too much thyroid  hormone. You have been taking methimazole  for two months, and your blood work supports continuing this medication. We will repeat your thyroid  function tests to monitor the effectiveness of the treatment. Please continue taking methimazole  as prescribed.  -MULTINODULAR GOITER: A multinodular goiter is an enlarged thyroid  gland with multiple nodules. You had an ultrasound in 2022 that showed these nodules. We have ordered a new thyroid  ultrasound to evaluate the current state of the nodules and will consider a biopsy if any nodules appear suspicious.  -JAUNDICE: Jaundice is a condition where your skin and the whites of your eyes turn yellow due to high bilirubin levels. Methimazole  is unlikely to be the cause. We have referred you to a hepatologist or gastroenterologist for further evaluation and have checked your blood work to assess your bilirubin levels.  INSTRUCTIONS: Please follow up with the hepatologist or gastroenterologist as referred for your jaundice evaluation. Continue taking methimazole  as prescribed and complete the repeated thyroid  function tests as ordered. Attend the scheduled thyroid  ultrasound to evaluate your nodules.    Contains text generated by [employer].   "

## 2024-12-21 NOTE — Progress Notes (Signed)
 PCP - Sydelle Close, PA Cardiologist - Dr. Ezra Shuck  PPM/ICD - denies   Chest x-ray - 02/14/24 EKG - 09/07/24 Stress Test - 09/21/12 ECHO - 06/22/24 Cardiac Cath - 12/17/23  CPAP - denies  DM- denies  Blood Thinner Instructions: Hold Warfarin 5 days. Last dose 1/21 Aspirin  Instructions: n/a  ERAS Protcol - no, NPO  COVID TEST- n/a  Anesthesia review: yes, cardiac history, difficult airway  Patient verbally denies any shortness of breath, fever, cough and chest pain during phone call      Questions were answered. Patient verbalized understanding of instructions.

## 2024-12-21 NOTE — Pre-Procedure Instructions (Signed)
-------------    SDW INSTRUCTIONS given:  Your procedure is scheduled on 1/27.  Report to [facility] Main Entrance A at 05:30 A.M., and check in at the Admitting office.  Any questions or running late day of surgery: call [phone]    Remember:  Do not eat or drink after midnight the night before your surgery    Take these medicines the morning of surgery with A SIP OF WATER   amiodarone  (PACERONE )  atorvastatin  (LIPITOR)  methimazole  (TAPAZOLE )  midodrine  (PROAMATINE )    May take these medicines IF NEEDED: albuterol  (PROVENTIL )   Hold Warfarin for 3 days prior to surgery. Last dose 1/23.  As of today, STOP taking any Aspirin  (unless otherwise instructed by your surgeon) Aleve, Naproxen, Ibuprofen , Motrin , Advil , Goody's, BC's, all herbal medications, fish oil, and all vitamins.   Do NOT Smoke (Tobacco/Vaping) 24 hours prior to your procedure  If you use a CPAP at night, you may bring all equipment for your overnight stay.     You will be asked to remove any contacts, glasses, piercing's, hearing aid's, dentures/partials prior to surgery. Please bring cases for these items if needed.     Patients discharged the day of surgery will not be allowed to drive home, and someone needs to stay with them for 24 hours.  SURGICAL WAITING ROOM VISITATION Patients may have no more than 2 support people in the waiting area - these visitors may rotate.   Pre-op  nurse will coordinate an appropriate time for 1 ADULT support person, who may not rotate, to accompany patient in pre-op .  Children under the age of 46 must have an adult with them who is not the patient and must remain in the main waiting area with an adult.  If the patient needs to stay at the hospital during part of their recovery, the visitor guidelines for inpatient rooms apply.  Please refer to the [facility] website for the visitor guidelines for any additional information.   Special instructions:   [facility]-  Preparing For Surgery   Please follow these instructions carefully.   Shower the NIGHT BEFORE SURGERY and the MORNING OF SURGERY with DIAL  Soap.   Pat yourself dry with a CLEAN TOWEL.  Wear CLEAN PAJAMAS to bed the night before surgery  Place CLEAN SHEETS on your bed the night of your first shower and DO NOT SLEEP WITH PETS.   Additional instructions for the day of surgery: DO NOT APPLY any lotions, deodorants, cologne, or perfumes.   Do not wear jewelry or makeup Do not wear nail polish, gel polish, artificial nails, or any other type of covering on natural nails (fingers and toes) Do not bring valuables to the hospital. [facility] is not responsible for valuables/personal belongings. Put on clean/comfortable clothes.  Please brush your teeth.  Ask your nurse before applying any prescription medications to the skin.

## 2024-12-21 NOTE — Progress Notes (Signed)
 "    Outpatient Endocrinology Note Vanessa Birmingham, MD  12/21/24   Vanessa [name] 15-Nov-1951 [id number]  Referring Provider: Colletta Manuelita Nat DEVONNA Primary Care Provider: Redmon, Noelle, PA Subjective  No chief complaint on file.   Assessment & Plan  Diagnoses and all orders for this visit:  Hyperthyroidism -     TSH -     T3, free -     T4, free -     TRAb (TSH Receptor Binding Antibody) -     Thyroid  stimulating immunoglobulin -     Comprehensive metabolic panel with GFR  Multinodular goiter -     US  THYROID ; Future -     US  THYROID  -     Comprehensive metabolic panel with GFR  Jaundice -     Ambulatory referral to Gastroenterology    Vanessa [name] is currently taking methimazole  5 mg every day since 09/2024. Patient was biochemically hyperthyroid on last check.  Educated on thyroid  axis.  Recommend the following: check labs today.  Patient also has hyperbilirubinemia and clearly yellow eyes that I ordered urgent gastroenterology referral for.  Patient has been started on methimazole  by another provider, that she has a side effect potential of jaundice.  I am doing a CMP to see if methimazole  has raised her bilirubin from her baseline level.  Patient reports she is up absolutely unaware of hyperbilirubinemia or jaundice. Repeat labs in 3 months or sooner if symptoms of hyper or hypothyroidism develop.  Based on the labs, having radioactive iodine or thyroidectomy may be a better option due to her ongoing jaundice and need for hyper thyroidism treatment.  Will decide based on labs -complications of untreated hyperthyroidism including atrial fibrillation, heart failure and osteoporosis -side effects of Methimazole  including but not limited to allergic reaction, rash, bone marrow suppression, liver dysfunction and teratogenic potential -compliance and follow up needs    If you notice any symptoms of worsening fatigue, fever with sore throat, loss of appetite,  yellowing of eyes, dark urine, joint pains, sores in the mouth, itchy rash, light colored stools or abdominal pain, please stop the medication and call us  immediately as this can be a serious side effect of the medication.  06/15/22: THYROID  SCAN AND UPTAKE - 4 AND 24 HOURS: Normal iodine uptake values. Thyromegaly, with heterogeneous uptake of radiotracer throughout the thyroid  as above, consistent with goiter. Photopenic region mid to lower pole left lobe thyroid  corresponds to a large solid nodule that was previously biopsied. Please correlate with biopsy results. 11/26/21: THYROID  ULTRASOUND: Subtle interval enlargement of previously biopsied mass occupying the majority of the left inferior gland. Recommend correlation with prior biopsy results.  Greater than 5 year stability of additional scattered thyroid  nodules in the isthmus and right gland consistent with benignity. Ordered f/u thyroid  U/S  I have reviewed current medications, nurse's notes, allergies, vital signs, past medical and surgical history, family medical history, and social history for this encounter. Counseled patient on symptoms, examination findings, lab findings, imaging results, treatment decisions and monitoring and prognosis. The patient understood the recommendations and agrees with the treatment plan. All questions regarding treatment plan were fully answered.   Return in about 3 months (around 03/21/2025) for visit + labs before next visit, labs today.   Vanessa Birmingham, MD  12/21/24   I have reviewed current medications, nurse's notes, allergies, vital signs, past medical and surgical history, family medical history, and social history for this encounter. Counseled patient on symptoms, examination findings, lab findings,  imaging results, treatment decisions and monitoring and prognosis. The patient understood the recommendations and agrees with the treatment plan. All questions regarding treatment plan were fully  answered.   History of Present Illness Vanessa [name] is a 75 y.o. year old female who presents to our clinic with elevated FT4 diagnosed in 2022.    Seen by Dr Verba Von: Per their notes: MULTINODULAR goiter with questionable hyperthyroidism as of 12/22. Appears to have been asymptomatic at the time of diagnosis and was diagnosed incidentally when tested by cardiologist Although her free T4 was slightly higher at the time of diagnosis her TSH was not suppressed confirming hyperthyroidism.  Previously on only low-dose methimazole  3 days a week.  Patient was discussed about radioactive iodine but she was somewhat hesitant about doing this, explained to her the details and the safety of I-131 in the process including getting a scan and uptake before back  Symptoms suggestive of HYPOTHYROIDISM:  fatigue No weight gain No+- few labs, gets HD M/W/F cold intolerance  No constipation  No  Symptoms suggestive of HYPERTHYROIDISM:  weight loss  No heat intolerance No hyperdefecation  No palpitations  No  Compressive symptoms:  dysphagia  No dysphonia  No positional dyspnea (especially with simultaneous arms elevation)  No  Smokes  No On biotin  No Personal history of head/neck surgery/irradiation  No  Adverse Drug Effects from Methimazole  (MMI): rash No fever No throat pain No arthritis No mouth ulcers No jaundice No loss of appetite No lymphadenopathy No  Grave's Ophthalmopathy Clinical Activity Score: 0/9  Physical Exam  BP 100/80   Ht 5' 3 (1.6 m)   Wt 148 lb (67.1 kg)   BMI 26.22 kg/m  Constitutional: well developed, well nourished, protruding bilateral eyes with yellow discoloration: Patient reports seeing an ophthalmologist recently Head: normocephalic, atraumatic, no exophthalmos Eyes: sclera anicteric, no redness Neck: no clear thyromegaly noted, no thyroid  tenderness; no clear nodules palpated Lungs: normal respiratory effort Neurology: alert and  oriented, no fine hand tremor Skin: dry, no appreciable rashes Musculoskeletal: no appreciable defects Psychiatric: normal mood and affect  Allergies Allergies[1]  Current Medications Patient's Medications  New Prescriptions   No medications on file  Previous Medications   ALBUTEROL  (PROVENTIL ) (2.5 MG/3ML) 0.083% NEBULIZER SOLUTION    Take 2.5 mg by nebulization 2 (two) times daily.   AMIODARONE  (PACERONE ) 100 MG TABLET    Take 1 tablet (100 mg total) by mouth daily.   ASCORBIC ACID  (VITAMIN C ) 500 MG TABLET    Take 0.5 tablets (250 mg total) by mouth 2 (two) times daily.   ATORVASTATIN  (LIPITOR) 10 MG TABLET    Take 1 tablet (10 mg total) by mouth daily.   IRON  SUCROSE IN SODIUM CHLORIDE  0.9 % 100 ML    50 mg.   METHIMAZOLE  (TAPAZOLE ) 5 MG TABLET    Take 1 tablet by mouth once daily   METHOXY PEG-EPOETIN BETA (MIRCERA IJ)    30 mcg.   MIDODRINE  (PROAMATINE ) 10 MG TABLET    Take 2 tablets (20 mg total) by mouth 3 (three) times daily with meals.   TORSEMIDE  (DEMADEX ) 100 MG TABLET    Take 1 tablet (100 mg total) by mouth every Tuesday, Thursday, and Saturday at 6 PM.   WARFARIN (COUMADIN ) 5 MG TABLET    TAKE 1/2 TO 1 (ONE-HALF TO ONE) TABLET BY MOUTH ONCE DAILY OR AS DIRECTED BY THE COUMADIN  CLINIC  Modified Medications   No medications on file  Discontinued Medications  No medications on file    Past Medical History Past Medical History:  Diagnosis Date   CHF (congestive heart failure) (HCC)    Dyspnea    occasional w/exertion   ESRD on hemodialysis (HCC)    dialysis Mon Wed Fri   Heart murmur    Hypertension    Moderate mitral regurgitation    moderate to severe MR with moderate pulmonary HTN   Multinodular goiter    Nonischemic cardiomyopathy (HCC)    EF 30-35% by echo 2015   Pulmonary HTN (HCC)    moderate with PASP by echo 2015   PVC's (premature ventricular contractions)     Past Surgical History Past Surgical History:  Procedure Laterality Date    BASCILIC VEIN TRANSPOSITION Left 09/11/2024   Procedure: TRANSPOSITION, VEIN, BASILIC;  Surgeon: Lanis Fonda BRAVO, MD;  Location: [facility] OR;  Service: Vascular;  Laterality: Left;   CARDIAC CATHETERIZATION  2003   normal coronary arteries   CARDIAC CATHETERIZATION  2013  Nov   h/o nonischemic DCM EF 35-40% increase LVEDP    EXPLORATION POST OPERATIVE OPEN HEART N/A 11/29/2023   Procedure: EXPLORATION POST OPERATIVE OPEN HEART;  Surgeon: Maryjane Mt, MD;  Location: [facility] OR;  Service: Open Heart Surgery;  Laterality: N/A;   HYSTEROSCOPY WITH D & C  12/02/2012   Procedure: DILATATION AND CURETTAGE /HYSTEROSCOPY;  Surgeon: Rexene PARAS. Rosalva, MD;  Location: [facility] ORS;  Service: Gynecology;  Laterality: N/A;  cervical polypectomy   INSERTION OF ARTERIOVENOUS (AV) ARTEGRAFT ARM Left 07/25/2024   Procedure: CREATION OF BRACHIOBASILLIC ARTERIOVENOUS FISTULA;  Surgeon: Lanis Fonda BRAVO, MD;  Location: [facility] OR;  Service: Vascular;  Laterality: Left;   IR FLUORO GUIDE CV LINE LEFT  12/24/2023   IR FLUORO GUIDE CV LINE RIGHT  12/24/2023   IR REMOVAL TUN CV CATH W/O FL  01/20/2024   IR US  GUIDE VASC ACCESS LEFT  12/24/2023   IR US  GUIDE VASC ACCESS RIGHT  12/24/2023   LEFT AND RIGHT HEART CATHETERIZATION WITH CORONARY ANGIOGRAM N/A 02/15/2015   Procedure: LEFT AND RIGHT HEART CATHETERIZATION WITH CORONARY ANGIOGRAM;  Surgeon: Candyce GORMAN Reek, MD;  Location: [facility] C[facility] LAB;  Service: Cardiovascular;  Laterality: N/A;   MITRAL VALVE REPLACEMENT N/A 11/29/2023   Procedure: MITRAL VALVE (MV) REPLACEMENT USING MOSAIC 310CINCH TISSUE VALVE;  Surgeon: Maryjane Mt, MD;  Location: [facility] OR;  Service: Open Heart Surgery;  Laterality: N/A;   RIGHT HEART CATH N/A 12/17/2023   Procedure: RIGHT HEART CATH;  Surgeon: Rolan Ezra GORMAN, MD;  Location: [facility] INVASIVE CV LAB;  Service: Cardiovascular;  Laterality: N/A;   RIGHT/LEFT HEART CATH AND CORONARY ANGIOGRAPHY N/A 10/12/2023   Procedure: RIGHT/LEFT HEART CATH AND CORONARY ANGIOGRAPHY;   Surgeon: Rolan Ezra GORMAN, MD;  Location: [facility] INVASIVE CV LAB;  Service: Cardiovascular;  Laterality: N/A;   TEE WITHOUT CARDIOVERSION N/A 10/11/2018   Procedure: TRANSESOPHAGEAL ECHOCARDIOGRAM (TEE);  Surgeon: Rolan Ezra GORMAN, MD;  Location: [facility] ENDOSCOPY;  Service: Cardiovascular;  Laterality: N/A;   TEE WITHOUT CARDIOVERSION N/A 09/07/2023   Procedure: TRANSESOPHAGEAL ECHOCARDIOGRAM;  Surgeon: Rolan Ezra GORMAN, MD;  Location: [facility] INVASIVE CV LAB;  Service: Cardiovascular;  Laterality: N/A;   TEE WITHOUT CARDIOVERSION N/A 11/29/2023   Procedure: TRANSESOPHAGEAL ECHOCARDIOGRAM;  Surgeon: Maryjane Mt, MD;  Location: [facility] OR;  Service: Open Heart Surgery;  Laterality: N/A;   TRANSCATHETER MITRAL EDGE TO EDGE REPAIR N/A 12/14/2018   Procedure: MITRAL VALVE REPAIR;  Surgeon: Wonda Sharper, MD;  Location: [facility] INVASIVE CV LAB;  Service: Cardiovascular;  Laterality: N/A;   TRICUSPID VALVE REPLACEMENT N/A 11/29/2023   Procedure: TRICUSPID VALVE REPAIR USING MC3 EDWARDS TRICUSPID ANNULOPLASTY RING;  Surgeon: Maryjane Mt, MD;  Location: [facility] OR;  Service: Open Heart Surgery;  Laterality: N/A;    Family History family history includes Breast cancer (age of onset: 65) in her sister; Breast cancer (age of onset: 20) in her mother; Heart disease in her father.  Social History Social History   Socioeconomic History   Marital status: Single    Spouse name: Not on file   Number of children: Not on file   Years of education: Not on file   Highest education level: Not on file  Occupational History   Occupation: Retired    Comment: Textile, [employer]  Tobacco Use   Smoking status: Never   Smokeless tobacco: Never  Vaping Use   Vaping status: Never Used  Substance and Sexual Activity   Alcohol use: No    Alcohol/week: 0.0 standard drinks of alcohol   Drug use: No   Sexual activity: Not Currently    Birth control/protection: Post-menopausal  Other Topics Concern   Not on file   Social History Narrative   Not on file   Social Drivers of Health   Tobacco Use: Low Risk (12/21/2024)   Patient History    Smoking Tobacco Use: Never    Smokeless Tobacco Use: Never    Passive Exposure: Not on file  Financial Resource Strain: Not on file  Food Insecurity: No Food Insecurity (02/08/2024)   Hunger Vital Sign    Worried About Running Out of Food in the Last Year: Never true    Ran Out of Food in the Last Year: Never true  Transportation Needs: No Transportation Needs (02/08/2024)   PRAPARE - Administrator, Civil Service (Medical): No    Lack of Transportation (Non-Medical): No  Physical Activity: Not on file  Stress: Not on file  Social Connections: Socially Isolated (02/08/2024)   Social Connection and Isolation Panel    Frequency of Communication with Friends and Family: Twice a week    Frequency of Social Gatherings with Friends and Family: Never    Attends Religious Services: Never    Database Administrator or Organizations: No    Attends Banker Meetings: Never    Marital Status: Separated  Intimate Partner Violence: Not At Risk (02/08/2024)   Humiliation, Afraid, Rape, and Kick questionnaire    Fear of Current or Ex-Partner: No    Emotionally Abused: No    Physically Abused: No    Sexually Abused: No  Depression (PHQ2-9): Not on file  Alcohol Screen: Not on file  Housing: Low Risk (02/08/2024)   Housing Stability Vital Sign    Unable to Pay for Housing in the Last Year: No    Number of Times Moved in the Last Year: 0    Homeless in the Last Year: No  Utilities: Not At Risk (02/08/2024)   AHC Utilities    Threatened with loss of utilities: No  Health Literacy: Not on file    Laboratory Investigations Lab Results  Component Value Date   TSH 0.444 09/07/2024   TSH 0.041 (L) 07/13/2024   TSH 0.075 (L) 06/27/2024   FREET4 2.22 (H) 09/07/2024   FREET4 3.66 (H) 07/13/2024   FREET4 3.06 (H) 06/27/2024     No results found for:  TSI   No components found for: TRAB   Lab Results  Component Value Date  CHOL 178 08/11/2019   Lab Results  Component Value Date   HDL 57 08/11/2019   Lab Results  Component Value Date   LDLCALC 109 (H) 08/11/2019   Lab Results  Component Value Date   TRIG 90 11/30/2023   Lab Results  Component Value Date   CHOLHDL 3.1 08/11/2019   Lab Results  Component Value Date   CREATININE 7.40 (H) 09/11/2024   Lab Results  Component Value Date   GFR 63.02 06/06/2015      Component Value Date/Time   NA 137 09/11/2024 0811   K 4.5 09/11/2024 0811   CL 104 09/11/2024 0811   CO2 26 09/07/2024 1005   GLUCOSE 102 (H) 09/11/2024 0811   BUN 72 (H) 09/11/2024 0811   CREATININE 7.40 (H) 09/11/2024 0811   CREATININE 1.30 (H) 04/29/2016 1144   CALCIUM  9.4 09/07/2024 1005   PROT 7.4 09/07/2024 1005   ALBUMIN  3.4 (L) 09/07/2024 1005   AST 21 09/07/2024 1005   ALT 18 09/07/2024 1005   ALKPHOS 73 09/07/2024 1005   BILITOT 5.8 (H) 09/07/2024 1005   GFRNONAA 11 (L) 09/07/2024 1005   GFRAA 42 (L) 08/19/2020 1304      Latest Ref Rng & Units 09/11/2024    8:11 AM 09/07/2024   10:05 AM 07/25/2024    6:42 AM  BMP  Glucose 70 - 99 mg/dL 897  871  97   BUN 8 - 23 mg/dL 72  21  15   Creatinine 0.44 - 1.00 mg/dL 2.59  5.83  5.69   Sodium 135 - 145 mmol/L 137  138  139   Potassium 3.5 - 5.1 mmol/L 4.5  4.4  4.0   Chloride 98 - 111 mmol/L 104  99  98   CO2 22 - 32 mmol/L  26    Calcium  8.9 - 10.3 mg/dL  9.4         Component Value Date/Time   WBC 8.2 02/19/2024 0515   RBC 3.77 (L) 02/19/2024 0515   HGB 11.6 (L) 09/11/2024 0811   HCT 34.0 (L) 09/11/2024 0811   PLT 186 02/19/2024 0515   MCV 97.9 02/19/2024 0515   MCH 30.8 02/19/2024 0515   MCHC 31.4 02/19/2024 0515   RDW 16.2 (H) 02/19/2024 0515   LYMPHSABS 2.0 02/14/2024 1931   MONOABS 0.8 02/14/2024 1931   EOSABS 0.0 02/14/2024 1931   BASOSABS 0.1 02/14/2024 1931      Parts of this note may have been dictated using voice  recognition software. There may be variances in spelling and vocabulary which are unintentional. Not all errors are proofread. Please notify the dino if any discrepancies are noted or if the meaning of any statement is not clear.       [1]  Allergies Allergen Reactions   Spironolactone      hyperkalemia   "

## 2024-12-22 ENCOUNTER — Encounter: Payer: Self-pay | Admitting: Gastroenterology

## 2024-12-22 LAB — COMPREHENSIVE METABOLIC PANEL WITH GFR
AG Ratio: 1.7 (calc) (ref 1.0–2.5)
ALT: 23 U/L (ref 6–29)
AST: 21 U/L (ref 10–35)
Albumin: 4.5 g/dL (ref 3.6–5.1)
Alkaline phosphatase (APISO): 65 U/L (ref 37–153)
BUN/Creatinine Ratio: 7 (calc) (ref 6–22)
BUN: 33 mg/dL — ABNORMAL HIGH (ref 7–25)
CO2: 32 mmol/L (ref 20–32)
Calcium: 9.9 mg/dL (ref 8.6–10.4)
Chloride: 94 mmol/L — ABNORMAL LOW (ref 98–110)
Creat: 4.42 mg/dL — ABNORMAL HIGH (ref 0.60–1.00)
Globulin: 2.7 g/dL (ref 1.9–3.7)
Glucose, Bld: 113 mg/dL — ABNORMAL HIGH (ref 65–99)
Potassium: 3.7 mmol/L (ref 3.5–5.3)
Sodium: 140 mmol/L (ref 135–146)
Total Bilirubin: 6.3 mg/dL — ABNORMAL HIGH (ref 0.2–1.2)
Total Protein: 7.2 g/dL (ref 6.1–8.1)
eGFR: 10 mL/min/1.73m2 — ABNORMAL LOW

## 2024-12-22 NOTE — Progress Notes (Signed)
 Anesthesia Chart Review: Same day workup  74 year old female follows in the advanced heart failure clinic for history of NICM, HFrEF, pulmonary hypertension (mild by RHC 12/17/2023), moderate RV dysfunction, severe mitral regurgitation s/p bioprosthetic MVR 10/2023 (with concomitant tricuspid valve repair), hypotension on midodrine  20 mg tid, PVCs, paroxysmal atrial fibrillation on amiodarone  and warfarin. Bioprosthetic MVR and tricuspid ring 11/29/23. Pre-op  cath with no significant CAD.  Pre-op  echo with EF 40-45%, severe MR. Post-op shock/AKI, went back to the OR with post-op tamponade for clean-out.  Required CVVH then iHD. Post-op echo 3/25 with EF 35-40%, moderate RV dysfunction with moderate RV enlargement, PASP 50 mmHg, bioprosthetic mitral valve with mean gradient up to 13 mmHg with appearance of a fixed posterior leaflet (?severe stenosis), s/p TV repair with mild-moderate TR, mild AS with mean gradient 9 mmHg.  Repeat echo 07/25 EF 30-35%, RV mildly reduced, RVSP 64 mmHg, bioprosthetic mitral valve with lower mean gradient at 6.5 mmHg, TV repair with mild to moderate TR, mild AS with mean gradient 8 mmHg, dilated IVC. Per cardiology notes, she would not be a candidate for redo surgery and is not interested in invasive procedure such as TMVR.   She was last seen in follow-up in the advanced heart failure clinic by Jordan Lee, NP on 12/07/2024.  Per note, She returns today for HF follow up. Overall feeling okay. NYHA II. Still gets hypotensive at times with HD. Has stopped her methimazole  due to hair loss, does not want to restart. No SOB, CP, or edema. Eating well, staying away from high K foods. Has endocrine appointment at the end of the month, as well as appointment for AVF creation.SABRASABRAScheduled for AVF creation with Dr. Lanis 12/27/23.  She can hold warfarin for fistula surgery. Amiodarone  was reduced to 100 mg daily and event monitor was ordered.   History of toxic multinodular goiter. Previously  followed by endocrinology but lost to followup. Thyroid  panel ordered by Manuelita Dutch, PA-C 06/27/24 showed TSH 0.075 and free T4 3.06  She was initiated on methimazole  5mg  daily and referred back to endocrinology for management of hyperthyroidism. Recheck labs 07/13/2024 with TSH 0.041 and free T4 3.66.  She reestablished with endocrinologist Dr. Dartha on 12/21/2024.  Per note, check labs today.  Patient also has hyperbilirubinemia and clearly yellow eyes that I ordered urgent gastroenterology referral for.  Patient has been started on methimazole  by another provider, that she has a side effect potential of jaundice.  I am doing a CMP to see if methimazole  has raised her bilirubin from her baseline level.  Patient reports she is up absolutely unaware of hyperbilirubinemia or jaundice. Repeat labs in 3 months or sooner if symptoms of hyper or hypothyroidism develop.  Based on the labs, having radioactive iodine or thyroidectomy may be a better option due to her ongoing jaundice and need for hyper thyroidism treatment.  Will decide based on labs -complications of untreated hyperthyroidism including atrial fibrillation, heart failure and osteoporosis -side effects of Methimazole  including but not limited to allergic reaction, rash, bone marrow suppression, liver dysfunction and teratogenic potential -compliance and follow up needs.    CMP 12/21/2024 showed persistent chronic hyperbilirubinemia with total bilirubin 6.3, creatinine 4.42 consistent with history of ESRD, otherwise unremarkable.  Total Bilirubin (mg/dL)  Date Value  98/77/7973 6.3 (H)  09/07/2024 5.8 (H)  06/27/2024 4.5 (H)  04/20/2024 5.1 (H)  02/14/2024 3.9 (H)   CT C/A/P 12/13/23 shows markedly enlarged left thyroid  gland with rightward tracheal deviation.    History  of difficult airway. Per anesthesia airway note 11/29/23, Difficulty Due To: Difficulty was anticipated, Difficult Airway- due to reduced neck mobility and Difficult Airway-  due to anterior larynx Future Recommendations: Recommend- induction with short-acting agent, and alternative techniques readily available Comments: Previous difficult airway noted; all equipment immediately available. DL x1 with Glide 3 with grade 2 view; blade removed and head repositioned; DL x2 with glide 3 grade 2 view; ETT passed through cords. +chest rise and fall, +EtCO2, +BBS.   Last dose Coumadin  12/20/24.   Dialyzing via R TDC on MWF schedule. s/p left 1st stage BVT 07/25/2024 and left 2nd stage BVT on 09/11/2024    She will need DOS labs and eval.   EKG 09/07/2024: Sinus rhythm with first-degree AV block.  Rate 84.  Nonspecific intraventricular conduction delay.  Marked ST abnormality, possible inferior subendocardial injury.   TTE 06/22/24:  1. Left ventricular ejection fraction, by estimation, is 30 to 35%. The  left ventricle has moderately decreased function. The left ventricle  demonstrates global hypokinesis. The left ventricular internal cavity size  was mildly dilated. Left ventricular  diastolic function could not be evaluated.   2. Right ventricular systolic function is mildly reduced. The right  ventricular size is mildly enlarged. There is severely elevated pulmonary  artery systolic pressure. The estimated right ventricular systolic  pressure is 60.4 mmHg.   3. The mitral valve has been repaired/replaced. No evidence of mitral  valve regurgitation. Mild mitral stenosis. The mean mitral valve gradient  is 6.5 mmHg. There is a 21 mm Medtronic bioprosthetic valve present in the  mitral position. Procedure Date:  11/29/23.   4. The tricuspid valve is has been repaired/replaced. The tricuspid valve  is status post repair with an annuloplasty ring. Tricuspid valve  regurgitation is mild to moderate.   5. The aortic valve is tricuspid. Aortic valve regurgitation is trivial.  Mild aortic valve stenosis. Aortic valve mean gradient measures 8.0 mmHg.  Aortic valve Vmax  measures 1.80 m/s. Although the mean AVG and Vmax are  c/w no AS, the DVI is 0.49 and the   SVI is low at 21. Suspect patient has paradoxical mild low flow low  gradient aortic stenosis.   6. The inferior vena cava is dilated in size with <50% respiratory  variability, suggesting right atrial pressure of 15 mmHg.     Lynwood Geofm RIGGERS [facility] Short Stay Center/Anesthesiology Phone [phone] 12/22/2024 10:29 AM

## 2024-12-22 NOTE — Anesthesia Preprocedure Evaluation (Signed)
 "                                  Anesthesia Evaluation  Patient identified by MRN, date of birth, ID band Patient awake    Reviewed: Allergy & Precautions, NPO status , Patient's Chart, lab work & pertinent test results  History of Anesthesia Complications (+) DIFFICULT AIRWAY and history of anesthetic complications  Airway Mallampati: II  TM Distance: >3 FB Neck ROM: Full    Dental no notable dental hx. (+) Teeth Intact, Dental Advisory Given   Pulmonary shortness of breath   Pulmonary exam normal breath sounds clear to auscultation       Cardiovascular hypertension, Pt. on medications pulmonary hypertension+ CAD and +CHF  Normal cardiovascular exam+ dysrhythmias Atrial Fibrillation + Valvular Problems/Murmurs (s/p MVR) MR  Rhythm:Regular Rate:Normal  TTE 2025 1. Left ventricular ejection fraction, by estimation, is 30 to 35%. The  left ventricle has moderately decreased function. The left ventricle  demonstrates global hypokinesis. The left ventricular internal cavity size  was mildly dilated. Left ventricular  diastolic function could not be evaluated.   2. Right ventricular systolic function is mildly reduced. The right  ventricular size is mildly enlarged. There is severely elevated pulmonary  artery systolic pressure. The estimated right ventricular systolic  pressure is 60.4 mmHg.   3. The mitral valve has been repaired/replaced. No evidence of mitral  valve regurgitation. Mild mitral stenosis. The mean mitral valve gradient  is 6.5 mmHg. There is a 21 mm Medtronic bioprosthetic valve present in the  mitral position. Procedure Date:  11/29/23.   4. The tricuspid valve is has been repaired/replaced. The tricuspid valve  is status post repair with an annuloplasty ring. Tricuspid valve  regurgitation is mild to moderate.   5. The aortic valve is tricuspid. Aortic valve regurgitation is trivial.  Mild aortic valve stenosis. Aortic valve mean gradient  measures 8.0 mmHg.  Aortic valve Vmax measures 1.80 m/s. Although the mean AVG and Vmax are  c/w no AS, the DVI is 0.49 and the   SVI is low at 21. Suspect patient has paradoxical mild low flow low  gradient aortic stenosis.   6. The inferior vena cava is dilated in size with <50% respiratory  variability, suggesting right atrial pressure of 15 mmHg.   Right heart cath 2025 1. Low filling pressures.  2. Mild pulmonary artery hypertension 3. Low cardiac output, CI 1.74 by Fick.  4. Preserved PAPi     Neuro/Psych  PSYCHIATRIC DISORDERS  Depression    negative neurological ROS     GI/Hepatic negative GI ROS, Neg liver ROS,,,  Endo/Other   Hyperthyroidism   Renal/GU ESRF and DialysisRenal disease (dialysis MWF)  negative genitourinary   Musculoskeletal negative musculoskeletal ROS (+)    Abdominal   Peds  Hematology  (+) Blood dyscrasia (coumadin ), anemia   Anesthesia Other Findings Prior airway records reviewed  Reproductive/Obstetrics                              Anesthesia Physical Anesthesia Plan  ASA: 3  Anesthesia Plan: General   Post-op Pain Management: Tylenol  PO (pre-op )*   Induction: Intravenous  PONV Risk Score and Plan: 3 and Ondansetron , Dexamethasone  and Treatment may vary due to age or medical condition  Airway Management Planned: LMA  Additional Equipment: None  Intra-op Plan:   Post-operative Plan: Extubation  in OR  Informed Consent: I have reviewed the patients History and Physical, chart, labs and discussed the procedure including the risks, benefits and alternatives for the proposed anesthesia with the patient or authorized representative who has indicated his/her understanding and acceptance.     Dental advisory given  Plan Discussed with: CRNA  Anesthesia Plan Comments: (PAT note by Lynwood Hope, PA-C: 74 year old female follows in the advanced heart failure clinic for history of NICM, HFrEF, pulmonary  hypertension (mild by RHC 12/17/2023), moderate RV dysfunction, severe mitral regurgitation s/p bioprosthetic MVR 10/2023 (with concomitant tricuspid valve repair), hypotension on midodrine  20 mg tid, PVCs, paroxysmal atrial fibrillation on amiodarone  and warfarin. Bioprosthetic MVR and tricuspid ring 11/29/23. Pre-op  cath with no significant CAD.  Pre-op  echo with EF 40-45%, severe MR. Post-op shock/AKI, went back to the OR with post-op tamponade for clean-out.  Required CVVH then iHD. Post-op echo 3/25 with EF 35-40%, moderate RV dysfunction with moderate RV enlargement, PASP 50 mmHg, bioprosthetic mitral valve with mean gradient up to 13 mmHg with appearance of a fixed posterior leaflet (?severe stenosis), s/p TV repair with mild-moderate TR, mild AS with mean gradient 9 mmHg.  Repeat echo 07/25 EF 30-35%, RV mildly reduced, RVSP 64 mmHg, bioprosthetic mitral valve with lower mean gradient at 6.5 mmHg, TV repair with mild to moderate TR, mild AS with mean gradient 8 mmHg, dilated IVC. Per cardiology notes, she would not be a candidate for redo surgery and is not interested in invasive procedure such as TMVR.   She was last seen in follow-up in the advanced heart failure clinic by Jordan Lee, NP on 12/07/2024.  Per note, She returns today for HF follow up. Overall feeling okay. NYHA II. Still gets hypotensive at times with HD. Has stopped her methimazole  due to hair loss, does not want to restart. No SOB, CP, or edema. Eating well, staying away from high K foods. Has endocrine appointment at the end of the month, as well as appointment for AVF creation.SABRASABRAScheduled for AVF creation with Dr. Lanis 12/27/23.  She can hold warfarin for fistula surgery. Amiodarone  was reduced to 100 mg daily and event monitor was ordered.   History of toxic multinodular goiter. Previously followed by endocrinology but lost to followup. Thyroid  panel ordered by Manuelita Dutch, PA-C 06/27/24 showed TSH 0.075 and free T4 3.06  She was  initiated on methimazole  5mg  daily and referred back to endocrinology for management of hyperthyroidism. Recheck labs 07/13/2024 with TSH 0.041 and free T4 3.66.  She reestablished with endocrinologist Dr. Dartha on 12/21/2024.  Per note, check labs today.  Patient also has hyperbilirubinemia and clearly yellow eyes that I ordered urgent gastroenterology referral for.  Patient has been started on methimazole  by another provider, that she has a side effect potential of jaundice.  I am doing a CMP to see if methimazole  has raised her bilirubin from her baseline level.  Patient reports she is up absolutely unaware of hyperbilirubinemia or jaundice. Repeat labs in 3 months or sooner if symptoms of hyper or hypothyroidism develop.  Based on the labs, having radioactive iodine or thyroidectomy may be a better option due to her ongoing jaundice and need for hyper thyroidism treatment.  Will decide based on labs -complications of untreated hyperthyroidism including atrial fibrillation, heart failure and osteoporosis -side effects of Methimazole  including but not limited to allergic reaction, rash, bone marrow suppression, liver dysfunction and teratogenic potential -compliance and follow up needs.    CMP 12/21/2024 showed persistent chronic hyperbilirubinemia with  total bilirubin 6.3, creatinine 4.42 consistent with history of ESRD, otherwise unremarkable.  Total Bilirubin (mg/dL) Date Value 98/77/7973 6.3 (H) 09/07/2024 5.8 (H) 06/27/2024 4.5 (H) 04/20/2024 5.1 (H) 02/14/2024 3.9 (H)  CT C/A/P 12/13/23 shows markedly enlarged left thyroid  gland with rightward tracheal deviation.    History of difficult airway. Per anesthesia airway note 11/29/23, Difficulty Due To: Difficulty was anticipated, Difficult Airway- due to reduced neck mobility and Difficult Airway- due to anterior larynx Future Recommendations: Recommend- induction with short-acting agent, and alternative techniques readily available Comments:  Previous difficult airway noted; all equipment immediately available. DL x1 with Glide 3 with grade 2 view; blade removed and head repositioned; DL x2 with glide 3 grade 2 view; ETT passed through cords. +chest rise and fall, +EtCO2, +BBS.   Last dose Coumadin  12/20/24.   Dialyzing via R TDC on MWF schedule. s/p left 1st stage BVT 07/25/2024 and left 2nd stage BVT on 09/11/2024    She will need DOS labs and eval.   EKG 09/07/2024: Sinus rhythm with first-degree AV block.  Rate 84.  Nonspecific intraventricular conduction delay.  Marked ST abnormality, possible inferior subendocardial injury.   TTE 06/22/24:  1. Left ventricular ejection fraction, by estimation, is 30 to 35%. The  left ventricle has moderately decreased function. The left ventricle  demonstrates global hypokinesis. The left ventricular internal cavity size  was mildly dilated. Left ventricular  diastolic function could not be evaluated.   2. Right ventricular systolic function is mildly reduced. The right  ventricular size is mildly enlarged. There is severely elevated pulmonary  artery systolic pressure. The estimated right ventricular systolic  pressure is 60.4 mmHg.   3. The mitral valve has been repaired/replaced. No evidence of mitral  valve regurgitation. Mild mitral stenosis. The mean mitral valve gradient  is 6.5 mmHg. There is a 21 mm Medtronic bioprosthetic valve present in the  mitral position. Procedure Date:  11/29/23.   4. The tricuspid valve is has been repaired/replaced. The tricuspid valve  is status post repair with an annuloplasty ring. Tricuspid valve  regurgitation is mild to moderate.   5. The aortic valve is tricuspid. Aortic valve regurgitation is trivial.  Mild aortic valve stenosis. Aortic valve mean gradient measures 8.0 mmHg.  Aortic valve Vmax measures 1.80 m/s. Although the mean AVG and Vmax are  c/w no AS, the DVI is 0.49 and the   SVI is low at 21. Suspect patient has paradoxical mild low  flow low  gradient aortic stenosis.   6. The inferior vena cava is dilated in size with <50% respiratory  variability, suggesting right atrial pressure of 15 mmHg.   )         Anesthesia Quick Evaluation  "

## 2024-12-26 ENCOUNTER — Encounter (HOSPITAL_COMMUNITY)
Admission: RE | Disposition: A | Discharge status: Home / Self Care | Payer: Self-pay | Source: Home / Self Care | Attending: Vascular Surgery

## 2024-12-26 ENCOUNTER — Ambulatory Visit (HOSPITAL_COMMUNITY)
Admission: RE | Admit: 2024-12-26 | Discharge: 2024-12-26 | Disposition: A | Discharge status: Home / Self Care | Payer: Self-pay | Attending: Vascular Surgery | Admitting: Vascular Surgery

## 2024-12-26 ENCOUNTER — Encounter (HOSPITAL_COMMUNITY): Payer: Self-pay | Admitting: Physician Assistant

## 2024-12-26 ENCOUNTER — Ambulatory Visit (HOSPITAL_BASED_OUTPATIENT_CLINIC_OR_DEPARTMENT_OTHER): Payer: Self-pay | Admitting: Physician Assistant

## 2024-12-26 DIAGNOSIS — I428 Other cardiomyopathies: Secondary | ICD-10-CM | POA: Diagnosis not present

## 2024-12-26 DIAGNOSIS — I5020 Unspecified systolic (congestive) heart failure: Secondary | ICD-10-CM | POA: Insufficient documentation

## 2024-12-26 DIAGNOSIS — Z79899 Other long term (current) drug therapy: Secondary | ICD-10-CM | POA: Diagnosis not present

## 2024-12-26 DIAGNOSIS — N186 End stage renal disease: Secondary | ICD-10-CM

## 2024-12-26 DIAGNOSIS — Z7901 Long term (current) use of anticoagulants: Secondary | ICD-10-CM | POA: Diagnosis not present

## 2024-12-26 DIAGNOSIS — E890 Postprocedural hypothyroidism: Secondary | ICD-10-CM | POA: Insufficient documentation

## 2024-12-26 DIAGNOSIS — I132 Hypertensive heart and chronic kidney disease with heart failure and with stage 5 chronic kidney disease, or end stage renal disease: Secondary | ICD-10-CM

## 2024-12-26 DIAGNOSIS — I5033 Acute on chronic diastolic (congestive) heart failure: Secondary | ICD-10-CM | POA: Diagnosis not present

## 2024-12-26 DIAGNOSIS — Z992 Dependence on renal dialysis: Secondary | ICD-10-CM | POA: Insufficient documentation

## 2024-12-26 DIAGNOSIS — D631 Anemia in chronic kidney disease: Secondary | ICD-10-CM | POA: Diagnosis not present

## 2024-12-26 DIAGNOSIS — I871 Compression of vein: Secondary | ICD-10-CM | POA: Insufficient documentation

## 2024-12-26 DIAGNOSIS — I480 Paroxysmal atrial fibrillation: Secondary | ICD-10-CM | POA: Insufficient documentation

## 2024-12-26 DIAGNOSIS — I272 Pulmonary hypertension, unspecified: Secondary | ICD-10-CM | POA: Insufficient documentation

## 2024-12-26 HISTORY — DX: Failed or difficult intubation, initial encounter: T88.4XXA

## 2024-12-26 LAB — TRAB (TSH RECEPTOR BINDING ANTIBODY): TRAB: <1 [IU]/L

## 2024-12-26 LAB — THYROID STIMULATING IMMUNOGLOBULIN: TSI: <89 % baseline

## 2024-12-26 LAB — APTT: aPTT: 22 s — ABNORMAL LOW (ref 24–36)

## 2024-12-26 LAB — T4, FREE: Free T4: 4.6 ng/dL — ABNORMAL HIGH (ref 0.8–1.8)

## 2024-12-26 LAB — T3, FREE: T3, Free: 5.1 pg/mL — ABNORMAL HIGH (ref 2.3–4.2)

## 2024-12-26 LAB — PROTIME-INR
INR: 1.2 (ref 0.8–1.2)
Prothrombin Time: 16.1 s — ABNORMAL HIGH (ref 11.4–15.2)

## 2024-12-26 LAB — TSH: TSH: 0.02 m[IU]/L — ABNORMAL LOW (ref 0.40–4.50)

## 2024-12-26 MED ORDER — CHLORHEXIDINE GLUCONATE 0.12 % MT SOLN
15.0000 mL | Freq: Once | OROMUCOSAL | Status: AC
  Administered 2024-12-26: 15 mL via OROMUCOSAL
  Filled 2024-12-26: qty 15

## 2024-12-26 MED ORDER — OXYCODONE HCL 5 MG PO TABS: 5.0000 mg | ORAL_TABLET | Freq: Once | ORAL | Status: DC | PRN

## 2024-12-26 MED ORDER — CHLORHEXIDINE GLUCONATE 4 % EX SOLN: 60.0000 mL | Freq: Once | CUTANEOUS | Status: DC

## 2024-12-26 MED ORDER — ACETAMINOPHEN 10 MG/ML IV SOLN
INTRAVENOUS | Status: DC | PRN
  Administered 2024-12-26: 1000 mg via INTRAVENOUS

## 2024-12-26 MED ORDER — CEFAZOLIN SODIUM-DEXTROSE 2-4 GM/100ML-% IV SOLN
2.0000 g | INTRAVENOUS | Status: AC
  Administered 2024-12-26: 2 g via INTRAVENOUS
  Filled 2024-12-26: qty 100

## 2024-12-26 MED ORDER — DIPHENHYDRAMINE HCL 50 MG/ML IJ SOLN
INTRAMUSCULAR | Status: AC
  Filled 2024-12-26: qty 1

## 2024-12-26 MED ORDER — SODIUM CHLORIDE 0.9 % IV SOLN: INTRAVENOUS | Status: DC

## 2024-12-26 MED ORDER — DEXAMETHASONE SOD PHOSPHATE PF 10 MG/ML IJ SOLN
INTRAMUSCULAR | Status: DC | PRN
  Administered 2024-12-26: 10 mg via INTRAVENOUS

## 2024-12-26 MED ORDER — PHENYLEPHRINE 80 MCG/ML (10ML) SYRINGE FOR IV PUSH (FOR BLOOD PRESSURE SUPPORT)
PREFILLED_SYRINGE | INTRAVENOUS | Status: DC | PRN
  Administered 2024-12-26 (×2): 80 ug via INTRAVENOUS

## 2024-12-26 MED ORDER — FENTANYL CITRATE (PF) 100 MCG/2ML IJ SOLN: 25.0000 ug | INTRAMUSCULAR | Status: DC | PRN

## 2024-12-26 MED ORDER — ONDANSETRON HCL 4 MG/2ML IJ SOLN
INTRAMUSCULAR | Status: DC | PRN
  Administered 2024-12-26: 4 mg via INTRAVENOUS

## 2024-12-26 MED ORDER — HEPARIN 6000 UNIT IRRIGATION SOLUTION
Status: DC | PRN
  Administered 2024-12-26: 1

## 2024-12-26 MED ORDER — DEXAMETHASONE SOD PHOSPHATE PF 10 MG/ML IJ SOLN
INTRAMUSCULAR | Status: AC
  Filled 2024-12-26: qty 1

## 2024-12-26 MED ORDER — LIDOCAINE 2% (20 MG/ML) 5 ML SYRINGE
INTRAMUSCULAR | Status: AC
  Filled 2024-12-26: qty 5

## 2024-12-26 MED ORDER — PROTAMINE SULFATE 10 MG/ML IV SOLN
INTRAVENOUS | Status: DC | PRN
  Administered 2024-12-26: 20 mg via INTRAVENOUS

## 2024-12-26 MED ORDER — 0.9 % SODIUM CHLORIDE (POUR BTL) OPTIME
TOPICAL | Status: DC | PRN
  Administered 2024-12-26: 1000 mL

## 2024-12-26 MED ORDER — OXYCODONE HCL 5 MG/5ML PO SOLN: 5.0000 mg | Freq: Once | ORAL | Status: DC | PRN

## 2024-12-26 MED ORDER — ORAL CARE MOUTH RINSE: 15.0000 mL | Freq: Once | OROMUCOSAL | Status: AC

## 2024-12-26 MED ORDER — LIDOCAINE 2% (20 MG/ML) 5 ML SYRINGE
INTRAMUSCULAR | Status: DC | PRN
  Administered 2024-12-26: 100 mg via INTRAVENOUS

## 2024-12-26 MED ORDER — ACETAMINOPHEN 10 MG/ML IV SOLN: 1000.0000 mg | Freq: Once | INTRAVENOUS | Status: DC | PRN

## 2024-12-26 MED ORDER — MIDAZOLAM HCL 2 MG/2ML IJ SOLN
INTRAMUSCULAR | Status: AC
  Filled 2024-12-26: qty 2

## 2024-12-26 MED ORDER — OXYCODONE HCL 5 MG PO TABS: 5.0000 mg | ORAL_TABLET | Freq: Four times a day (QID) | ORAL | 0 refills | Status: AC | PRN

## 2024-12-26 MED ORDER — SURGIFLO WITH THROMBIN (HEMOSTATIC MATRIX KIT) OPTIME
TOPICAL | Status: DC | PRN
  Administered 2024-12-26: 1 via TOPICAL

## 2024-12-26 MED ORDER — FENTANYL CITRATE (PF) 100 MCG/2ML IJ SOLN
INTRAMUSCULAR | Status: AC
  Filled 2024-12-26: qty 2

## 2024-12-26 MED ORDER — FENTANYL CITRATE (PF) 100 MCG/2ML IJ SOLN
INTRAMUSCULAR | Status: DC | PRN
  Administered 2024-12-26 (×4): 25 ug via INTRAVENOUS

## 2024-12-26 MED ORDER — HEPARIN SODIUM (PORCINE) 1000 UNIT/ML IJ SOLN
INTRAMUSCULAR | Status: DC | PRN
  Administered 2024-12-26: 3000 [IU] via INTRAVENOUS

## 2024-12-26 MED ORDER — SODIUM CHLORIDE 0.9 % IV SOLN
20.0000 ug | INTRAVENOUS | Status: AC
  Administered 2024-12-26: 20 ug via INTRAVENOUS
  Filled 2024-12-26: qty 5

## 2024-12-26 MED ORDER — PROTAMINE SULFATE 10 MG/ML IV SOLN
INTRAVENOUS | Status: AC
  Filled 2024-12-26: qty 5

## 2024-12-26 MED ORDER — ONDANSETRON HCL 4 MG/2ML IJ SOLN
INTRAMUSCULAR | Status: AC
  Filled 2024-12-26: qty 2

## 2024-12-26 MED ORDER — ACETAMINOPHEN 10 MG/ML IV SOLN
INTRAVENOUS | Status: AC
  Filled 2024-12-26: qty 100

## 2024-12-26 MED ORDER — HEPARIN SODIUM (PORCINE) 1000 UNIT/ML IJ SOLN
INTRAMUSCULAR | Status: AC
  Filled 2024-12-26: qty 10

## 2024-12-26 MED ORDER — HEMOSTATIC AGENTS (NO CHARGE) OPTIME
TOPICAL | Status: DC | PRN
  Administered 2024-12-26: 1 via TOPICAL

## 2024-12-26 MED ORDER — DIPHENHYDRAMINE HCL 50 MG/ML IJ SOLN
12.5000 mg | Freq: Once | INTRAMUSCULAR | Status: AC
  Administered 2024-12-26: 12.5 mg via INTRAVENOUS

## 2024-12-26 MED ORDER — HEPARIN 6000 UNIT IRRIGATION SOLUTION
Status: AC
  Filled 2024-12-26: qty 500

## 2024-12-26 MED ORDER — DROPERIDOL 2.5 MG/ML IJ SOLN: 0.6250 mg | Freq: Once | INTRAMUSCULAR | Status: DC | PRN

## 2024-12-26 MED ORDER — PHENYLEPHRINE HCL-NACL 20-0.9 MG/250ML-% IV SOLN
INTRAVENOUS | Status: DC | PRN
  Administered 2024-12-26: 35 ug/min via INTRAVENOUS

## 2024-12-26 MED ORDER — CLEVIDIPINE BUTYRATE 0.5 MG/ML IV EMUL
INTRAVENOUS | Status: AC
  Filled 2024-12-26: qty 50

## 2024-12-26 MED ORDER — PROPOFOL 10 MG/ML IV BOLUS
INTRAVENOUS | Status: DC | PRN
  Administered 2024-12-26: 60 mg via INTRAVENOUS

## 2024-12-26 NOTE — Transfer of Care (Signed)
 Immediate Anesthesia Transfer of Care Note  [name]  Procedure(s) Performed: INSERTION LEFT UPPER EXTREMITY ARTERIOVENOUS GORE-TEX GRAFT (Left: Arm Upper)  Patient Location: PACU  Anesthesia Type:General  Level of Consciousness: awake, alert , and oriented  Airway & Oxygen Therapy: Patient Spontanous Breathing and Patient connected to face mask oxygen  Post-op Assessment: Report given to RN, Post -op Vital signs reviewed and stable, Patient moving all extremities X 4, and Patient able to stick tongue midline  Post vital signs: Reviewed and stable  Last Vitals:  Vitals Value Taken Time  BP 121/59 12/26/24 09:17  Temp 98.6   Pulse 75 12/26/24 09:17  Resp 16 12/26/24 09:17  SpO2 100 % 12/26/24 09:17  Vitals shown include unfiled device data.  Last Pain:  Vitals:   12/26/24 0643  TempSrc:   PainSc: 0-No pain         Complications: No notable events documented.

## 2024-12-26 NOTE — H&P (Signed)
" °  OFFICE NOTE  Patient seen and examined in preop holding.  No complaints. No changes to medication history or physical exam since last seen in clinic. After discussing the risks and benefits of left arm AV graft, Vanessa [name] elected to proceed.   Fonda FORBES Rim MD   CC:  F/u for surgery  HPI:  This is a 74 y.o. female who is s/p left 1st stage BVT 07/25/2024 and left 2nd stage BVT on 09/11/2024 by Dr. Rim.    Patient was last seen in the office with low flow in the brachiobasilic fistula.  This was roughly 2 weeks after surgery so therefore we did not pursue fistulogram as the anastomosis was relatively fresh.  At the time of the operation, the vein was very sclerotic.  I was concerned that further stenosis may occur.  On exam today, Vanessa [name] was doing well.  She had a great Thanksgiving, and spent time with family.  She is looking forward to going to her nephew's house and [facility]  for Christmas.  She continues to undergo dialysis from a right sided tunneled dialysis catheter.   Allergies  Allergen Reactions   Spironolactone      hyperkalemia    Current Facility-Administered Medications  Medication Dose Route Frequency Provider Last Rate Last Admin   0.9 %  sodium chloride  infusion   Intravenous Continuous Quintarius Ferns E, MD       ceFAZolin  (ANCEF ) IVPB 2g/100 mL premix  2 g Intravenous 30 min Pre-Op  Arial Galligan E, MD       chlorhexidine  (HIBICLENS ) 4 % liquid 4 Application  60 mL Topical Once Miklo Aken E, MD       And   chlorhexidine  (HIBICLENS ) 4 % liquid 4 Application  60 mL Topical Once Lynnmarie Lovett E, MD         ROS:  See HPI  Physical Exam:  Today's Vitals   12/21/24 1618 12/26/24 0605 12/26/24 0643  BP:  130/76   Pulse:  79   Resp:  18   Temp:  97.6 F (36.4 C)   TempSrc:  Oral   SpO2:  93%   Weight: 67.1 kg 65.3 kg   Height: 5' 3 (1.6 m) 5' 3 (1.6 m)   PainSc:   0-No pain    Body mass index is 25.51 kg/m.   Incision:   well healed Extremities:   There is a palpable left radial pulse.   Motor and sensory are in tact.   There is a faint thrill and bruit.  It is easier palpated proximally but still faint.      Assessment/Plan:  This is a 74 y.o. female who is s/p: left 1st stage BVT 07/25/2024 and left 2nd stage BVT on 09/11/2024.  On physical exam, she has a soft, palpable thrill Imaging demonstrates very low flow in the left arm brachiobasilic fistula, this is due to proximal stenosis due to significant sclerosis of the vein.  This was appreciated IntraOp.  I do not think that there is utility in attempting balloon venoplasty.  We discussed that she would be best served with fistula ligation, AV graft placement in the left arm.  I also offered her a right arm brachiobasilic fistula, but being that she is right-handed, she elected Sten the left arm.  She would like to pursue surgery after the first year and go to the holidays which I think is very reasonable. Will book left arm AV graft surgery at her convenience.   "

## 2024-12-26 NOTE — Op Note (Addendum)
 "   NAME: Vanessa [name]    MRN: [id number] DOB: [birth year]    DATE OF OPERATION: 12/26/2024  PREOP DIAGNOSIS:    End stage renal disease   POSTOP DIAGNOSIS:    Same  PROCEDURE:    Left brachial artery to axillary vein AV graft with 4-7 mm  SURGEON: Fonda FORBES Rim  ASSIST: Curry Damme PA  ANESTHESIA: Moderate    EBL: 50ml  INDICATIONS:    Vanessa [name] is a 74 y.o. female with history of end-stage renal disease and failed left arm brachiobasilic fistula.  She presents for new HD access.  After discussing risks and benefits of left arm brachial artery to axillary vein AV graft, Blima elected to proceed.  FINDINGS:   8 mm left axillary vein which appeared to be paired.  3 mm brachial artery.  TECHNIQUE:   After informed consent was obtained, the patient was brought to the operating room, placed supine on the operating room table.  Moderate anesthesia was used.  The patient was prepped and draped in normal sterile fashion.  Surgical time-out was taken. Pre-op  antibiotics were given.  Procedure began with using ultrasound and sent the brachial artery and axillary vein in the Left arm.  Both proved of sufficient size to continue with AV graft.  A longitudinal incision was made at the distal aspect of the humerus. The brachial artery was identified, gently dissected, and encircled with silastic loops. A second incision was then made over the axillary vein and similarly dissected and encircled. The tunneling device was then passed between these incisions. A 4 X 7 taper PTFE graft was passed through the tunnel, taking care to avoid kinking or twists. The graft was irrigated with heparinized saline solution.   Proximal and distal arterial control was then obtained and a longitudinal arteriotomy was made on the brachial artery. The artery was irrigated with heparinized saline solution. An end to side artery anastomosis was then performed from the 4mm graft to the brachial vein in  a running fashion using running 6-0 prolene suture. When the clamps were removed from the artery, the graft demonstrated excellent inflow. It was flushed with heparinized saline prior to being clamped. There was an excellent radial and ulnar signal at the wrist.  Attention was then directed to the axillary vein anastomosis. Proximal and distal control were obtained.  Distally the vein was ligated.  An end to end vein anastomosis was then performed, using a running, 6-0 prolene suture. Prior to establishing flow, all vessels were back bled and flushed to ensure there was no clot.    There was a strong distal signal in the radial artery and a thrill in the vein centrally. Hemostasis was found to be satisfactory, and all wounds were irrigated with saline solution and closed in layers with vicryl and Monocryl at the skin. A dry sterile dressing was applied. Anesthetic care was terminated. The patient was transported to the recovery room in stable condition.   Given the complexity of the case,  the assistant was necessary in order to expedient the procedure and safely perform the technical aspects of the operation.  The assistant provided traction and countertraction to assist with exposure of the artery and vein.  They also assisted with suture ligation of multiple venous branches. They also assisted with tunneling of the graft.  They played a critical role for both anastomoses.. These skills, especially following the Prolene suture for the anastomosis, could not have been adequately performed by a scrub tech  assistant.    Fonda FORBES Rim, MD [facility] DATE OF DICTATION:   12/26/2024  "

## 2024-12-26 NOTE — Discharge Instructions (Signed)
 "  [facility]  Discharge Instructions  AV Fistula or Graft Surgery for Dialysis Access  Please refer to the following instructions for your post-procedure care. Your surgeon or physician assistant will discuss any changes with you.  Activity  You may drive the day following your surgery, if you are comfortable and no longer taking prescription pain medication. Resume full activity as the soreness in your incision resolves.  Bathing/Showering  You may shower after you go home. Keep your incision dry for 48 hours. Do not soak in a bathtub, hot tub, or swim until the incision heals completely. You may not shower if you have a hemodialysis catheter.  Incision Care  Clean your incision with mild soap and water  after 48 hours. Pat the area dry with a clean towel. You do not need a bandage unless otherwise instructed. Do not apply any ointments or creams to your incision. You may have skin glue on your incision. Do not peel it off. It will come off on its own in about one week. Your arm may swell a bit after surgery. To reduce swelling use pillows to elevate your arm so it is above your heart. Your doctor will tell you if you need to lightly wrap your arm with an ACE bandage.  Diet  Resume your normal diet. There are not special food restrictions following this procedure. In order to heal from your surgery, it is CRITICAL to get adequate nutrition. Your body requires vitamins, minerals, and protein. Vegetables are the best source of vitamins and minerals. Vegetables also provide the perfect balance of protein. Processed food has little nutritional value, so try to avoid this.  Medications  Resume taking all of your medications. If your incision is causing pain, you may take over-the counter pain relievers such as acetaminophen  (Tylenol ). If you were prescribed a stronger pain medication, please be aware these medications can cause nausea and constipation. Prevent  nausea by taking the medication with a snack or meal. Avoid constipation by drinking plenty of fluids and eating foods with high amount of fiber, such as fruits, vegetables, and grains.  Do not take Tylenol  if you are taking prescription pain medications.  Follow up Your surgeon may want to see you in the office following your access surgery. If so, this will be arranged at the time of your surgery.  Please call us  immediately for any of the following conditions:  Increased pain, redness, drainage (pus) from your incision site Fever of 101 degrees or higher Severe or worsening pain at your incision site Hand pain or numbness.  Reduce your risk of vascular disease:  Stop smoking. If you would like help, call [facility] at 1-800-QUIT-NOW ((667)273-8426) or [place] at [phone]  Manage your cholesterol Maintain a desired weight Control your diabetes Keep your blood pressure down  Dialysis  It will take several weeks to several months for your new dialysis access to be ready for use. Your surgeon will determine when it is okay to use it. Your nephrologist will continue to direct your dialysis. You can continue to use your Permcath until your new access is ready for use.   12/26/2024 Vanessa [name] [id number] 11/09/1951  Surgeon(s): Lanis Fonda BRAVO, MD  Procedures: INSERTION LEFT UPPER EXTREMITY ARTERIOVENOUS GORE-TEX GRAFT   May stick graft immediately   May stick graft on designated area only:   X Do not stick left AV Graft for 6 weeks    If you have any questions, please call  the office at [phone]. "

## 2024-12-26 NOTE — Anesthesia Procedure Notes (Signed)
 Procedure Name: LMA Insertion Date/Time: 12/26/2024 7:28 AM  Performed by: Harrold Macintosh, CRNAPre-anesthesia Checklist: Patient identified, Emergency Drugs available, Suction available, Patient being monitored and Timeout performed Patient Re-evaluated:Patient Re-evaluated prior to induction Oxygen Delivery Method: Circle system utilized Preoxygenation: Pre-oxygenation with 100% oxygen Induction Type: IV induction LMA: LMA inserted LMA Size: 4.0 Number of attempts: 1 Placement Confirmation: positive ETCO2 and breath sounds checked- equal and bilateral Tube secured with: Tape Dental Injury: Teeth and Oropharynx as per pre-operative assessment

## 2024-12-26 NOTE — Anesthesia Postprocedure Evaluation (Signed)
"   Anesthesia Post Note  Patient: TERREA BRUSTER  Procedure(s) Performed: INSERTION LEFT UPPER EXTREMITY ARTERIOVENOUS GORE-TEX GRAFT (Left: Arm Upper)     Patient location during evaluation: PACU Anesthesia Type: General Level of consciousness: awake and alert Pain management: pain level controlled Vital Signs Assessment: post-procedure vital signs reviewed and stable Respiratory status: spontaneous breathing, nonlabored ventilation, respiratory function stable and patient connected to nasal cannula oxygen Cardiovascular status: blood pressure returned to baseline and stable Postop Assessment: no apparent nausea or vomiting Anesthetic complications: no   No notable events documented.  Last Vitals:  Vitals:   12/26/24 0945 12/26/24 1000  BP: 105/64 97/60  Pulse: 78 75  Resp: 13 17  Temp:  36.7 C  SpO2: 90% 98%    Last Pain:  Vitals:   12/26/24 1000  TempSrc:   PainSc: 0-No pain                 Thom JONELLE Peoples      "

## 2024-12-27 ENCOUNTER — Encounter (HOSPITAL_COMMUNITY): Payer: Self-pay | Admitting: Vascular Surgery

## 2024-12-27 LAB — POCT I-STAT, CHEM 8
BUN: 35 mg/dL — ABNORMAL HIGH (ref 8–23)
Calcium, Ion: 1.04 mmol/L — ABNORMAL LOW (ref 1.15–1.40)
Chloride: 96 mmol/L — ABNORMAL LOW (ref 98–111)
Creatinine, Ser: 6 mg/dL — ABNORMAL HIGH (ref 0.44–1.00)
Glucose, Bld: 100 mg/dL — ABNORMAL HIGH (ref 70–99)
HCT: 34 % — ABNORMAL LOW (ref 36.0–46.0)
Hemoglobin: 11.6 g/dL — ABNORMAL LOW (ref 12.0–15.0)
Potassium: 3.5 mmol/L (ref 3.5–5.1)
Sodium: 140 mmol/L (ref 135–145)
TCO2: 32 mmol/L (ref 22–32)

## 2025-01-01 NOTE — Addendum Note (Signed)
 Encounter addended by: Debarah Garrison MATSU, RN on: 01/01/2025 2:37 PM  Actions taken: Imaging Exam ended

## 2025-01-02 ENCOUNTER — Ambulatory Visit

## 2025-01-02 ENCOUNTER — Ambulatory Visit: Payer: Self-pay | Admitting: "Endocrinology

## 2025-01-04 ENCOUNTER — Ambulatory Visit (HOSPITAL_COMMUNITY)

## 2025-01-05 ENCOUNTER — Ambulatory Visit (HOSPITAL_COMMUNITY): Payer: Self-pay | Admitting: Cardiology

## 2025-01-05 NOTE — Telephone Encounter (Addendum)
" °  Pt aware, agreeable, and verbalized understanding  ----- Message from Ezra Shuck, MD sent at 01/05/2025 10:31 AM EST ----- No worrisome arrhythmias.  "

## 2025-01-09 ENCOUNTER — Ambulatory Visit (HOSPITAL_COMMUNITY)

## 2025-01-11 ENCOUNTER — Ambulatory Visit: Admitting: "Endocrinology

## 2025-01-16 ENCOUNTER — Ambulatory Visit

## 2025-01-17 ENCOUNTER — Ambulatory Visit: Admitting: Gastroenterology

## 2025-01-18 ENCOUNTER — Ambulatory Visit: Admitting: Gastroenterology

## 2025-01-25 ENCOUNTER — Encounter

## 2025-03-15 ENCOUNTER — Other Ambulatory Visit

## 2025-03-22 ENCOUNTER — Ambulatory Visit: Admitting: "Endocrinology
